# Patient Record
Sex: Male | Born: 1935
Health system: Southern US, Community
[De-identification: ages and names within clinical notes are randomized; demographics above are authoritative.]

## PROBLEM LIST (undated history)

## (undated) DIAGNOSIS — G459 Transient cerebral ischemic attack, unspecified: Secondary | ICD-10-CM

## (undated) DIAGNOSIS — N529 Male erectile dysfunction, unspecified: Secondary | ICD-10-CM

## (undated) DIAGNOSIS — N401 Enlarged prostate with lower urinary tract symptoms: Secondary | ICD-10-CM

## (undated) DIAGNOSIS — C44311 Basal cell carcinoma of skin of nose: Secondary | ICD-10-CM

## (undated) DIAGNOSIS — R002 Palpitations: Secondary | ICD-10-CM

## (undated) DIAGNOSIS — E1140 Type 2 diabetes mellitus with diabetic neuropathy, unspecified: Secondary | ICD-10-CM

## (undated) DIAGNOSIS — K219 Gastro-esophageal reflux disease without esophagitis: Secondary | ICD-10-CM

## (undated) DIAGNOSIS — F32A Depression, unspecified: Secondary | ICD-10-CM

## (undated) DIAGNOSIS — N138 Other obstructive and reflux uropathy: Secondary | ICD-10-CM

## (undated) DIAGNOSIS — D649 Anemia, unspecified: Secondary | ICD-10-CM

## (undated) DIAGNOSIS — G2581 Restless legs syndrome: Secondary | ICD-10-CM

## (undated) DIAGNOSIS — N281 Cyst of kidney, acquired: Secondary | ICD-10-CM

## (undated) DIAGNOSIS — Z9989 Dependence on other enabling machines and devices: Secondary | ICD-10-CM

## (undated) DIAGNOSIS — L719 Rosacea, unspecified: Secondary | ICD-10-CM

## (undated) DIAGNOSIS — H9113 Presbycusis, bilateral: Secondary | ICD-10-CM

## (undated) DIAGNOSIS — N183 Chronic kidney disease, stage 3 (moderate): Secondary | ICD-10-CM

## (undated) DIAGNOSIS — G4733 Obstructive sleep apnea (adult) (pediatric): Secondary | ICD-10-CM

## (undated) DIAGNOSIS — E860 Dehydration: Secondary | ICD-10-CM

## (undated) DIAGNOSIS — Z85828 Personal history of other malignant neoplasm of skin: Secondary | ICD-10-CM

## (undated) DIAGNOSIS — Z8719 Personal history of other diseases of the digestive system: Secondary | ICD-10-CM

## (undated) DIAGNOSIS — E785 Hyperlipidemia, unspecified: Secondary | ICD-10-CM

## (undated) DIAGNOSIS — I639 Cerebral infarction, unspecified: Secondary | ICD-10-CM

## (undated) DIAGNOSIS — F329 Major depressive disorder, single episode, unspecified: Secondary | ICD-10-CM

## (undated) DIAGNOSIS — G43909 Migraine, unspecified, not intractable, without status migrainosus: Secondary | ICD-10-CM

## (undated) DIAGNOSIS — Z87448 Personal history of other diseases of urinary system: Secondary | ICD-10-CM

## (undated) DIAGNOSIS — C4491 Basal cell carcinoma of skin, unspecified: Secondary | ICD-10-CM

## (undated) HISTORY — DX: Type 2 diabetes mellitus with diabetic neuropathy, unspecified: E11.40

## (undated) HISTORY — DX: Benign prostatic hyperplasia with lower urinary tract symptoms: N13.8

## (undated) HISTORY — DX: Obstructive sleep apnea (adult) (pediatric): G47.33

## (undated) HISTORY — DX: Dehydration: E86.0

## (undated) HISTORY — PX: CAROTID ENDARTERECTOMY: SUR193

## (undated) HISTORY — DX: Cerebral infarction, unspecified: I63.9

## (undated) HISTORY — DX: Transient cerebral ischemic attack, unspecified: G45.9

## (undated) HISTORY — DX: Benign prostatic hyperplasia with lower urinary tract symptoms: N40.1

## (undated) HISTORY — DX: Rosacea, unspecified: L71.9

## (undated) HISTORY — DX: Presbycusis, bilateral: H91.13

## (undated) HISTORY — PX: INGUINAL HERNIA REPAIR: SUR1180

## (undated) HISTORY — DX: Dependence on other enabling machines and devices: Z99.89

## (undated) HISTORY — DX: Depression, unspecified: F32.A

## (undated) HISTORY — DX: Basal cell carcinoma of skin, unspecified: C44.91

## (undated) HISTORY — DX: Palpitations: R00.2

## (undated) HISTORY — DX: Migraine, unspecified, not intractable, without status migrainosus: G43.909

## (undated) HISTORY — PX: CATARACT EXTRACTION W/ INTRAOCULAR LENS IMPLANT: SHX1309

## (undated) HISTORY — DX: Cyst of kidney, acquired: N28.1

## (undated) HISTORY — PX: TONSILLECTOMY AND ADENOIDECTOMY: SUR1326

## (undated) HISTORY — DX: Male erectile dysfunction, unspecified: N52.9

## (undated) HISTORY — DX: Major depressive disorder, single episode, unspecified: F32.9

## (undated) HISTORY — DX: Hyperlipidemia, unspecified: E78.5

## (undated) HISTORY — DX: Personal history of other malignant neoplasm of skin: Z85.828

## (undated) HISTORY — DX: Anemia, unspecified: D64.9

## (undated) HISTORY — DX: Chronic kidney disease, stage 3 (moderate): N18.3

---

## 1989-09-18 HISTORY — PX: HEMORRHOID SURGERY: SHX153

## 2000-09-18 DIAGNOSIS — C44311 Basal cell carcinoma of skin of nose: Secondary | ICD-10-CM

## 2000-09-18 HISTORY — DX: Basal cell carcinoma of skin of nose: C44.311

## 2000-09-18 HISTORY — PX: SKIN CANCER EXCISION: SHX779

## 2002-09-18 HISTORY — PX: CARDIAC CATHETERIZATION: SHX172

## 2004-09-18 HISTORY — PX: ESOPHAGOGASTRODUODENOSCOPY: SHX1529

## 2005-09-18 HISTORY — PX: OTHER SURGICAL HISTORY: SHX169

## 2009-02-16 HISTORY — PX: PROSTATE SURGERY: SHX751

## 2009-09-18 DIAGNOSIS — W19XXXA Unspecified fall, initial encounter: Secondary | ICD-10-CM

## 2009-09-18 DIAGNOSIS — E1140 Type 2 diabetes mellitus with diabetic neuropathy, unspecified: Secondary | ICD-10-CM

## 2009-09-18 DIAGNOSIS — D696 Thrombocytopenia, unspecified: Secondary | ICD-10-CM | POA: Diagnosis present

## 2009-09-18 DIAGNOSIS — E871 Hypo-osmolality and hyponatremia: Secondary | ICD-10-CM | POA: Diagnosis present

## 2009-09-18 HISTORY — DX: Type 2 diabetes mellitus with diabetic neuropathy, unspecified: E11.40

## 2010-04-18 HISTORY — PX: OTHER SURGICAL HISTORY: SHX169

## 2010-11-30 ENCOUNTER — Emergency Department: Payer: Self-pay | Admitting: Emergency Medicine

## 2010-12-01 ENCOUNTER — Inpatient Hospital Stay (HOSPITAL_COMMUNITY)
Admission: AD | Admit: 2010-12-01 | Discharge: 2010-12-03 | DRG: 699 | Disposition: A | Discharge status: Home / Self Care | Payer: Medicare Other | Source: Other Acute Inpatient Hospital | Attending: General Surgery | Admitting: General Surgery

## 2010-12-01 DIAGNOSIS — G4733 Obstructive sleep apnea (adult) (pediatric): Secondary | ICD-10-CM | POA: Diagnosis present

## 2010-12-01 DIAGNOSIS — E785 Hyperlipidemia, unspecified: Secondary | ICD-10-CM | POA: Diagnosis present

## 2010-12-01 DIAGNOSIS — Y9229 Other specified public building as the place of occurrence of the external cause: Secondary | ICD-10-CM

## 2010-12-01 DIAGNOSIS — S37009A Unspecified injury of unspecified kidney, initial encounter: Principal | ICD-10-CM | POA: Diagnosis present

## 2010-12-01 DIAGNOSIS — G250 Essential tremor: Secondary | ICD-10-CM | POA: Diagnosis present

## 2010-12-01 DIAGNOSIS — D62 Acute posthemorrhagic anemia: Secondary | ICD-10-CM | POA: Diagnosis present

## 2010-12-01 DIAGNOSIS — G2581 Restless legs syndrome: Secondary | ICD-10-CM | POA: Diagnosis present

## 2010-12-01 DIAGNOSIS — K219 Gastro-esophageal reflux disease without esophagitis: Secondary | ICD-10-CM | POA: Diagnosis present

## 2010-12-01 DIAGNOSIS — E119 Type 2 diabetes mellitus without complications: Secondary | ICD-10-CM | POA: Diagnosis present

## 2010-12-01 DIAGNOSIS — Y998 Other external cause status: Secondary | ICD-10-CM

## 2010-12-01 DIAGNOSIS — R030 Elevated blood-pressure reading, without diagnosis of hypertension: Secondary | ICD-10-CM | POA: Diagnosis present

## 2010-12-01 DIAGNOSIS — R296 Repeated falls: Secondary | ICD-10-CM | POA: Diagnosis present

## 2010-12-01 DIAGNOSIS — G252 Other specified forms of tremor: Secondary | ICD-10-CM | POA: Diagnosis present

## 2010-12-01 LAB — CBC
HCT: 29.6 % — ABNORMAL LOW (ref 39.0–52.0)
HCT: 28.8 % — ABNORMAL LOW (ref 39.0–52.0)
Hemoglobin: 10.2 g/dL — ABNORMAL LOW (ref 13.0–17.0)
Hemoglobin: 9.9 g/dL — ABNORMAL LOW (ref 13.0–17.0)
MCH: 30.1 pg (ref 26.0–34.0)
MCH: 30.6 pg (ref 26.0–34.0)
MCHC: 34.5 g/dL (ref 30.0–36.0)
MCHC: 34.4 g/dL (ref 30.0–36.0)
MCV: 87.3 fL (ref 78.0–100.0)
MCV: 88.9 fL (ref 78.0–100.0)
Platelets: 164 10*3/uL (ref 150–400)
Platelets: 173 10*3/uL (ref 150–400)
RBC: 3.39 MIL/uL — ABNORMAL LOW (ref 4.22–5.81)
RBC: 3.24 MIL/uL — ABNORMAL LOW (ref 4.22–5.81)
RDW: 13.2 % (ref 11.5–15.5)
RDW: 13.3 % (ref 11.5–15.5)
WBC: 11.5 10*3/uL — ABNORMAL HIGH (ref 4.0–10.5)
WBC: 11.9 10*3/uL — ABNORMAL HIGH (ref 4.0–10.5)

## 2010-12-01 LAB — URINALYSIS, ROUTINE W REFLEX MICROSCOPIC
Bilirubin Urine: NEGATIVE
Glucose, UA: >1000 mg/dL — AB
Ketones, ur: NEGATIVE mg/dL
Leukocytes, UA: NEGATIVE
Nitrite: NEGATIVE
Protein, ur: NEGATIVE mg/dL
Specific Gravity, Urine: 1.036 — ABNORMAL HIGH (ref 1.005–1.030)
Urobilinogen, UA: 0.2 mg/dL (ref 0.0–1.0)
pH: 5 (ref 5.0–8.0)

## 2010-12-01 LAB — BASIC METABOLIC PANEL
BUN: 24 mg/dL — ABNORMAL HIGH (ref 6–23)
CO2: 24 mEq/L (ref 19–32)
Calcium: 8.4 mg/dL (ref 8.4–10.5)
Chloride: 106 mEq/L (ref 96–112)
Creatinine, Ser: 1.34 mg/dL (ref 0.4–1.5)
GFR calc Af Amer: >60 mL/min (ref >60)
GFR calc non Af Amer: 52 mL/min — ABNORMAL LOW (ref >60)
Glucose, Bld: 307 mg/dL — ABNORMAL HIGH (ref 70–99)
Potassium: 3.9 mEq/L (ref 3.5–5.1)
Sodium: 138 mEq/L (ref 135–145)

## 2010-12-01 LAB — URINE MICROSCOPIC-ADD ON

## 2010-12-01 LAB — GLUCOSE, CAPILLARY
Glucose-Capillary: 302 mg/dL — ABNORMAL HIGH (ref 70–99)
Glucose-Capillary: 289 mg/dL — ABNORMAL HIGH (ref 70–99)
Glucose-Capillary: 288 mg/dL — ABNORMAL HIGH (ref 70–99)

## 2010-12-01 LAB — MRSA PCR SCREENING: MRSA by PCR: NEGATIVE

## 2010-12-01 LAB — HEMOGLOBIN A1C
Hgb A1c MFr Bld: 9.3 % — ABNORMAL HIGH (ref <5.7)
Mean Plasma Glucose: 220 mg/dL — ABNORMAL HIGH (ref <117)

## 2010-12-02 LAB — CBC
HCT: 25.7 % — ABNORMAL LOW (ref 39.0–52.0)
HCT: 26.5 % — ABNORMAL LOW (ref 39.0–52.0)
HCT: 26.7 % — ABNORMAL LOW (ref 39.0–52.0)
HCT: 26.7 % — ABNORMAL LOW (ref 39.0–52.0)
Hemoglobin: 8.8 g/dL — ABNORMAL LOW (ref 13.0–17.0)
Hemoglobin: 9 g/dL — ABNORMAL LOW (ref 13.0–17.0)
Hemoglobin: 9.1 g/dL — ABNORMAL LOW (ref 13.0–17.0)
Hemoglobin: 9.1 g/dL — ABNORMAL LOW (ref 13.0–17.0)
MCH: 29.3 pg (ref 26.0–34.0)
MCH: 29.9 pg (ref 26.0–34.0)
MCH: 30 pg (ref 26.0–34.0)
MCH: 30.2 pg (ref 26.0–34.0)
MCHC: 33.7 g/dL (ref 30.0–36.0)
MCHC: 34.1 g/dL (ref 30.0–36.0)
MCHC: 34.2 g/dL (ref 30.0–36.0)
MCHC: 34.3 g/dL (ref 30.0–36.0)
MCV: 87 fL (ref 78.0–100.0)
MCV: 87.4 fL (ref 78.0–100.0)
MCV: 88 fL (ref 78.0–100.0)
MCV: 88.1 fL (ref 78.0–100.0)
Platelets: 138 10*3/uL — ABNORMAL LOW (ref 150–400)
Platelets: 139 10*3/uL — ABNORMAL LOW (ref 150–400)
Platelets: 141 10*3/uL — ABNORMAL LOW (ref 150–400)
Platelets: 156 10*3/uL (ref 150–400)
RBC: 2.94 MIL/uL — ABNORMAL LOW (ref 4.22–5.81)
RBC: 3.01 MIL/uL — ABNORMAL LOW (ref 4.22–5.81)
RBC: 3.03 MIL/uL — ABNORMAL LOW (ref 4.22–5.81)
RBC: 3.07 MIL/uL — ABNORMAL LOW (ref 4.22–5.81)
RDW: 12.9 % (ref 11.5–15.5)
RDW: 13.1 % (ref 11.5–15.5)
RDW: 13.3 % (ref 11.5–15.5)
RDW: 13.3 % (ref 11.5–15.5)
WBC: 11.2 10*3/uL — ABNORMAL HIGH (ref 4.0–10.5)
WBC: 11.2 10*3/uL — ABNORMAL HIGH (ref 4.0–10.5)
WBC: 11.3 10*3/uL — ABNORMAL HIGH (ref 4.0–10.5)
WBC: 12.4 10*3/uL — ABNORMAL HIGH (ref 4.0–10.5)

## 2010-12-02 LAB — GLUCOSE, CAPILLARY
Glucose-Capillary: 281 mg/dL — ABNORMAL HIGH (ref 70–99)
Glucose-Capillary: 237 mg/dL — ABNORMAL HIGH (ref 70–99)
Glucose-Capillary: 133 mg/dL — ABNORMAL HIGH (ref 70–99)
Glucose-Capillary: 202 mg/dL — ABNORMAL HIGH (ref 70–99)

## 2010-12-02 NOTE — H&P (Signed)
NAMEMOTTY, BORIN            ACCOUNT NO.:  [id number]  MEDICAL RECORD NO.:  [id number]           PATIENT TYPE:  I  LOCATION:  3311                         FACILITY:  [facility]  PHYSICIAN:  Almond Lint, MD       DATE OF BIRTH:  July 08, 1931  DATE OF ADMISSION:  12/01/2010 DATE OF DISCHARGE:                             HISTORY & PHYSICAL   CHIEF COMPLAINT:  Fall.  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  He struck his left side on the curb and had immediate left-sided pain.  He complained of pain over his lower chest and flank. He had an episode of emesis.  This occurred around 7 p.m. last night. He denied hit of his head and did not lose consciousness.  He describes the pain is worse with movement.  He does not have any more nausea or vomiting, now slightly hazy urine.  PAST MEDICAL HISTORY:  Significant for diabetes, sleep apnea, and restless legs syndrome.  PAST SURGICAL HISTORY:  Hernia repair.  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  ALLERGIES:  None.  MEDICATIONS:  Glyburide, metformin, Mirapex, vitamins, and CPAP.  He used to take an 81 mg aspirin daily, but has not been taking it for around a week.  REVIEW OF SYSTEMS:  Otherwise negative x 1.  PHYSICAL EXAMINATION:  VITAL SIGNS:  Temperature 98.7, pulse 79, respiratory rate 14, blood pressure 147/74, and O2 sats 98% on room air. SKIN:  He has some expected skin areas of pigmentation because of age. HEENT:  Head is normocephalic and atraumatic.  Sclerae anicteric. Pupils are equal and reactive to light.  Ears is no signs of external trauma and no blood in the auditory meatus.  Face, midface is stable. NECK:  Supple.  No lymphadenopathy.  No thyromegaly.  Good range of motion and nontender. LUNGS:  Clear bilaterally.  No wheeze, rales, or rhonchi. HEART:   Regular rate and rhythm. ABDOMEN:  Soft, slightly distended, tender in the left abdomen. PELVIS:  Stable. EXTREMITIES:  Nontender.  He does have a very superficial abrasion over his left knee. BACK:  Atraumatic. NEURO:  No abnormal findings  LABORATORY DATA:  Sodium 136, potassium 4.5, chloride 100, CO2 of 25, BUN 26, creatinine 1.59, glucose 381, white count 71.8, hemoglobin 12.9, hematocrit 37, platelet count 205,000 at 12:33 a.m. and 3:20 a.m.; white count 15.5, hemoglobin 1.1, hematocrit 31.8, and platelet count 198,000. Chest x-ray appears negative and also urine included in his workup and CT of the abdomen only not pelvis with IV contrast shows a prominent left subcapsular hematoma in the kidney extending into the pararenal fascia.  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  We will order serial CBCs and if these are stable through the day and can probably start to come off his n.p.o. status and may be able to transfer to the floor with incentive spirometry and pulmonary toilet.  For problems of diabetes, hold metformin,  treat with insulin.  We will hold aspirin, and administer CPAP for sleep apnea.     Almond Lint, MD     FB/MEDQ  D:  12/01/2010  T:  12/02/2010  Job:  [id number]  Electronically Signed by Almond Lint MD on 12/02/2010 12:19:45 PM

## 2010-12-03 LAB — CBC
HCT: 26.4 % — ABNORMAL LOW (ref 39.0–52.0)
HCT: 28 % — ABNORMAL LOW (ref 39.0–52.0)
Hemoglobin: 9.1 g/dL — ABNORMAL LOW (ref 13.0–17.0)
Hemoglobin: 9.7 g/dL — ABNORMAL LOW (ref 13.0–17.0)
MCH: 30 pg (ref 26.0–34.0)
MCH: 30.1 pg (ref 26.0–34.0)
MCHC: 34.5 g/dL (ref 30.0–36.0)
MCHC: 34.6 g/dL (ref 30.0–36.0)
MCV: 87.1 fL (ref 78.0–100.0)
MCV: 87 fL (ref 78.0–100.0)
Platelets: 157 10*3/uL (ref 150–400)
Platelets: 149 10*3/uL — ABNORMAL LOW (ref 150–400)
RBC: 3.03 MIL/uL — ABNORMAL LOW (ref 4.22–5.81)
RBC: 3.22 MIL/uL — ABNORMAL LOW (ref 4.22–5.81)
RDW: 12.9 % (ref 11.5–15.5)
RDW: 12.9 % (ref 11.5–15.5)
WBC: 12.7 10*3/uL — ABNORMAL HIGH (ref 4.0–10.5)
WBC: 10.7 10*3/uL — ABNORMAL HIGH (ref 4.0–10.5)

## 2010-12-03 LAB — GLUCOSE, CAPILLARY: Glucose-Capillary: 208 mg/dL — ABNORMAL HIGH (ref 70–99)

## 2010-12-05 LAB — GLUCOSE, CAPILLARY: Glucose-Capillary: 231 mg/dL — ABNORMAL HIGH (ref 70–99)

## 2010-12-06 NOTE — Discharge Summary (Signed)
James Bell, James Bell            ACCOUNT NO.:  [id number]  MEDICAL RECORD NO.:  [id number]           PATIENT TYPE:  I  LOCATION:  5120                         FACILITY:  [facility]  PHYSICIAN:  Lennie Muckle, MD      DATE OF BIRTH:  11-23-1930  DATE OF ADMISSION:  12/01/2010 DATE OF DISCHARGE:  12/03/2010                              DISCHARGE SUMMARY   DISCHARGE DIAGNOSES: 1. Fall. 2. Left renal laceration. 3. Acute blood loss anemia. 4. Obstructive sleep apnea. 5. Diabetes. 6. Restless legs syndrome. 7. Benign tremor. 8. Dyslipidemia. 9. Gastroesophageal reflux disease. 10.Elevated blood pressure.  CONSULTANTS:  None.  PROCEDURE:  None.  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  It was a mechanical fall as they were doing some workup in the area.  He struck his left abdomen and then bounced onto his back, striking that on the curb as well.  He had pain and an episode of emesis.  He went home but was still having significant amount of pain and so was evaluated at [facility].  He was diagnosed with a left renal laceration and transferred to the [facility] for further care.  HOSPITAL COURSE:  From a trauma standpoint, the patient's course was uneventful.  His hemoglobin drifted down into the 9s and stabilized there.  Once it did, he was able to be mobilized and did not have any drops in his hemoglobin.  He came in with uncontrolled diabetes.  The certified diabetic educator was consulted and recommended some outpatient diabetes education which was ordered.  The patient is a recent transplant to this area and has no established physicians.  He requested some referrals before he left the hospital.  His blood pressure was also elevated here in the hospital with systolics in the 170s.  He was placed on a beta-blocker which did an acceptable job in lowering that.  The rest of the patient's  medical problems remained stable, and he was able to be discharged in good condition.  DISCHARGE MEDICATIONS:  Percocet 5/325, take 1-2 p.o. q.4 h p.r.n. pain, #60 with no refill.  The patient was on a 20 mg propranolol tablet before surgery/procedure.  I have told him to take it twice daily until he sees internist.  He may resume the rest of his home medications which include; 1. Avodart 0.5 mg daily. 2. Calcium carbonate over-the-counter twice daily. 3. Crestor 10 mg daily. 4. Cymbalta 30 mg every evening p.o. 5. CoQ enzyme 150 mg twice daily. 6. Fish oil twice daily. 7. Flonase 1 spray in each nostril daily as needed. 8. Folbee vitamin daily. 9. Glyburide 5 mg before meals. 10.Januvia 100 mg daily. 11.Lunesta 3 mg daily at bedtime as needed for insomnia. 12.Metformin 1000 mg twice daily. 13.Mirapex 1 mg daily at bedtime. 14.Prilosec 20 mg daily. 15.Saline nasal spray nasally twice daily as needed for dryness. 16.Vitamin C 500 mg 4 times daily. 17.Zyrtec 10 mg daily. 18.Zomig 5 mg daily as needed for migraine. He should stop taking his aspirin for 1 month.  FOLLOWUP:  I  have recommended [facility] as an internal medicine group for the patient and have given their number to him.  I also recommended Dr. Sharl Ma for his diabetes.  He will call to set up appointments with those practices.  If he has any questions or concerns, first he may let us know but follow up with [name] will be on an as-needed basis.     Earney Hamburg, P.A.   ______________________________ Lennie Muckle, MD    MJ/MEDQ  D:  12/03/2010  T:  12/04/2010  Job:  [id number]  cc:   [facility] Tonita Cong, M.D.  Electronically Signed by Charma Igo P.A. on 12/05/2010 10:31:58 AM Electronically Signed by Bertram Savin MD on 12/06/2010 12:01:03 PM

## 2010-12-18 DIAGNOSIS — Z87448 Personal history of other diseases of urinary system: Secondary | ICD-10-CM

## 2010-12-18 HISTORY — DX: Personal history of other diseases of urinary system: Z87.448

## 2011-01-12 ENCOUNTER — Encounter: Payer: Medicare Other | Attending: General Surgery | Admitting: Dietician

## 2011-01-12 DIAGNOSIS — Z713 Dietary counseling and surveillance: Secondary | ICD-10-CM | POA: Insufficient documentation

## 2011-03-19 HISTORY — PX: COLONOSCOPY: SHX174

## 2011-06-19 HISTORY — PX: COLONOSCOPY: SHX174

## 2011-07-20 HISTORY — PX: ESOPHAGOGASTRODUODENOSCOPY: SHX1529

## 2011-09-21 DIAGNOSIS — R5383 Other fatigue: Secondary | ICD-10-CM | POA: Diagnosis not present

## 2011-09-21 DIAGNOSIS — E785 Hyperlipidemia, unspecified: Secondary | ICD-10-CM | POA: Diagnosis not present

## 2011-09-21 DIAGNOSIS — R5381 Other malaise: Secondary | ICD-10-CM | POA: Diagnosis not present

## 2011-09-21 DIAGNOSIS — G4730 Sleep apnea, unspecified: Secondary | ICD-10-CM | POA: Diagnosis not present

## 2011-09-21 DIAGNOSIS — E1142 Type 2 diabetes mellitus with diabetic polyneuropathy: Secondary | ICD-10-CM | POA: Diagnosis not present

## 2011-09-21 DIAGNOSIS — E1149 Type 2 diabetes mellitus with other diabetic neurological complication: Secondary | ICD-10-CM | POA: Diagnosis not present

## 2011-09-21 DIAGNOSIS — N529 Male erectile dysfunction, unspecified: Secondary | ICD-10-CM | POA: Diagnosis not present

## 2011-09-21 DIAGNOSIS — R109 Unspecified abdominal pain: Secondary | ICD-10-CM | POA: Diagnosis not present

## 2011-09-22 DIAGNOSIS — R109 Unspecified abdominal pain: Secondary | ICD-10-CM | POA: Diagnosis not present

## 2011-09-22 DIAGNOSIS — N529 Male erectile dysfunction, unspecified: Secondary | ICD-10-CM | POA: Diagnosis not present

## 2011-09-22 DIAGNOSIS — R5381 Other malaise: Secondary | ICD-10-CM | POA: Diagnosis not present

## 2011-09-25 DIAGNOSIS — E119 Type 2 diabetes mellitus without complications: Secondary | ICD-10-CM | POA: Diagnosis not present

## 2011-09-25 DIAGNOSIS — G2581 Restless legs syndrome: Secondary | ICD-10-CM | POA: Diagnosis not present

## 2011-09-25 DIAGNOSIS — G4730 Sleep apnea, unspecified: Secondary | ICD-10-CM | POA: Diagnosis not present

## 2011-09-25 DIAGNOSIS — I10 Essential (primary) hypertension: Secondary | ICD-10-CM | POA: Diagnosis not present

## 2011-09-25 DIAGNOSIS — E782 Mixed hyperlipidemia: Secondary | ICD-10-CM | POA: Diagnosis not present

## 2011-09-25 DIAGNOSIS — Z79899 Other long term (current) drug therapy: Secondary | ICD-10-CM | POA: Diagnosis not present

## 2011-09-29 DIAGNOSIS — E236 Other disorders of pituitary gland: Secondary | ICD-10-CM | POA: Diagnosis not present

## 2011-09-29 DIAGNOSIS — N529 Male erectile dysfunction, unspecified: Secondary | ICD-10-CM | POA: Diagnosis not present

## 2011-10-26 DIAGNOSIS — E236 Other disorders of pituitary gland: Secondary | ICD-10-CM | POA: Diagnosis not present

## 2011-10-26 DIAGNOSIS — Z125 Encounter for screening for malignant neoplasm of prostate: Secondary | ICD-10-CM | POA: Diagnosis not present

## 2012-01-17 DIAGNOSIS — R002 Palpitations: Secondary | ICD-10-CM

## 2012-01-17 HISTORY — DX: Palpitations: R00.2

## 2012-01-22 DIAGNOSIS — R002 Palpitations: Secondary | ICD-10-CM | POA: Diagnosis not present

## 2012-01-22 DIAGNOSIS — G4730 Sleep apnea, unspecified: Secondary | ICD-10-CM | POA: Diagnosis not present

## 2012-01-22 DIAGNOSIS — E119 Type 2 diabetes mellitus without complications: Secondary | ICD-10-CM | POA: Diagnosis not present

## 2012-01-29 DIAGNOSIS — R002 Palpitations: Secondary | ICD-10-CM | POA: Diagnosis not present

## 2012-02-01 DIAGNOSIS — R002 Palpitations: Secondary | ICD-10-CM | POA: Diagnosis not present

## 2012-03-07 DIAGNOSIS — I658 Occlusion and stenosis of other precerebral arteries: Secondary | ICD-10-CM | POA: Diagnosis not present

## 2012-03-07 DIAGNOSIS — R079 Chest pain, unspecified: Secondary | ICD-10-CM | POA: Diagnosis not present

## 2012-03-07 DIAGNOSIS — Z7982 Long term (current) use of aspirin: Secondary | ICD-10-CM | POA: Diagnosis not present

## 2012-03-07 DIAGNOSIS — R5383 Other fatigue: Secondary | ICD-10-CM | POA: Diagnosis not present

## 2012-03-07 DIAGNOSIS — I6350 Cerebral infarction due to unspecified occlusion or stenosis of unspecified cerebral artery: Secondary | ICD-10-CM | POA: Diagnosis not present

## 2012-03-07 DIAGNOSIS — I6529 Occlusion and stenosis of unspecified carotid artery: Secondary | ICD-10-CM | POA: Diagnosis not present

## 2012-03-07 DIAGNOSIS — E119 Type 2 diabetes mellitus without complications: Secondary | ICD-10-CM | POA: Diagnosis not present

## 2012-03-07 DIAGNOSIS — Z79899 Other long term (current) drug therapy: Secondary | ICD-10-CM | POA: Diagnosis not present

## 2012-03-07 DIAGNOSIS — Z823 Family history of stroke: Secondary | ICD-10-CM | POA: Diagnosis not present

## 2012-03-07 DIAGNOSIS — R5381 Other malaise: Secondary | ICD-10-CM | POA: Diagnosis not present

## 2012-03-07 DIAGNOSIS — E7800 Pure hypercholesterolemia, unspecified: Secondary | ICD-10-CM | POA: Diagnosis not present

## 2012-03-07 DIAGNOSIS — Z794 Long term (current) use of insulin: Secondary | ICD-10-CM | POA: Diagnosis not present

## 2012-03-07 DIAGNOSIS — I639 Cerebral infarction, unspecified: Secondary | ICD-10-CM

## 2012-03-07 DIAGNOSIS — G2581 Restless legs syndrome: Secondary | ICD-10-CM | POA: Diagnosis not present

## 2012-03-07 DIAGNOSIS — I6789 Other cerebrovascular disease: Secondary | ICD-10-CM | POA: Diagnosis not present

## 2012-03-07 DIAGNOSIS — G8190 Hemiplegia, unspecified affecting unspecified side: Secondary | ICD-10-CM | POA: Diagnosis not present

## 2012-03-07 DIAGNOSIS — R29818 Other symptoms and signs involving the nervous system: Secondary | ICD-10-CM | POA: Diagnosis not present

## 2012-03-07 HISTORY — DX: Cerebral infarction, unspecified: I63.9

## 2012-03-11 ENCOUNTER — Encounter (HOSPITAL_COMMUNITY): Payer: Self-pay | Admitting: *Deleted

## 2012-03-11 ENCOUNTER — Observation Stay (HOSPITAL_COMMUNITY): Payer: Medicare Other

## 2012-03-11 ENCOUNTER — Emergency Department (HOSPITAL_COMMUNITY): Payer: Medicare Other

## 2012-03-11 ENCOUNTER — Inpatient Hospital Stay (HOSPITAL_COMMUNITY)
Admission: EM | Admit: 2012-03-11 | Discharge: 2012-03-13 | DRG: 065 | Disposition: A | Discharge status: Rehabilitation Facility | Payer: Medicare Other | Source: Ambulatory Visit | Attending: Internal Medicine | Admitting: Internal Medicine

## 2012-03-11 DIAGNOSIS — Z794 Long term (current) use of insulin: Secondary | ICD-10-CM

## 2012-03-11 DIAGNOSIS — R748 Abnormal levels of other serum enzymes: Secondary | ICD-10-CM | POA: Diagnosis not present

## 2012-03-11 DIAGNOSIS — I69351 Hemiplegia and hemiparesis following cerebral infarction affecting right dominant side: Secondary | ICD-10-CM | POA: Diagnosis present

## 2012-03-11 DIAGNOSIS — R5381 Other malaise: Secondary | ICD-10-CM | POA: Diagnosis not present

## 2012-03-11 DIAGNOSIS — E1122 Type 2 diabetes mellitus with diabetic chronic kidney disease: Secondary | ICD-10-CM | POA: Diagnosis present

## 2012-03-11 DIAGNOSIS — Z7982 Long term (current) use of aspirin: Secondary | ICD-10-CM

## 2012-03-11 DIAGNOSIS — G8191 Hemiplegia, unspecified affecting right dominant side: Secondary | ICD-10-CM

## 2012-03-11 DIAGNOSIS — G4733 Obstructive sleep apnea (adult) (pediatric): Secondary | ICD-10-CM | POA: Diagnosis not present

## 2012-03-11 DIAGNOSIS — M47812 Spondylosis without myelopathy or radiculopathy, cervical region: Secondary | ICD-10-CM | POA: Diagnosis not present

## 2012-03-11 DIAGNOSIS — I6350 Cerebral infarction due to unspecified occlusion or stenosis of unspecified cerebral artery: Principal | ICD-10-CM | POA: Diagnosis present

## 2012-03-11 DIAGNOSIS — R404 Transient alteration of awareness: Secondary | ICD-10-CM | POA: Diagnosis not present

## 2012-03-11 DIAGNOSIS — N1831 Chronic kidney disease, stage 3a: Secondary | ICD-10-CM | POA: Diagnosis present

## 2012-03-11 DIAGNOSIS — G8190 Hemiplegia, unspecified affecting unspecified side: Secondary | ICD-10-CM | POA: Diagnosis not present

## 2012-03-11 DIAGNOSIS — N183 Chronic kidney disease, stage 3 unspecified: Secondary | ICD-10-CM

## 2012-03-11 DIAGNOSIS — E7800 Pure hypercholesterolemia, unspecified: Secondary | ICD-10-CM | POA: Diagnosis present

## 2012-03-11 DIAGNOSIS — E1165 Type 2 diabetes mellitus with hyperglycemia: Secondary | ICD-10-CM

## 2012-03-11 DIAGNOSIS — IMO0001 Reserved for inherently not codable concepts without codable children: Secondary | ICD-10-CM

## 2012-03-11 DIAGNOSIS — Z9989 Dependence on other enabling machines and devices: Secondary | ICD-10-CM | POA: Diagnosis present

## 2012-03-11 DIAGNOSIS — E119 Type 2 diabetes mellitus without complications: Secondary | ICD-10-CM | POA: Diagnosis present

## 2012-03-11 DIAGNOSIS — I6330 Cerebral infarction due to thrombosis of unspecified cerebral artery: Secondary | ICD-10-CM

## 2012-03-11 DIAGNOSIS — Z8673 Personal history of transient ischemic attack (TIA), and cerebral infarction without residual deficits: Secondary | ICD-10-CM | POA: Diagnosis present

## 2012-03-11 DIAGNOSIS — R5383 Other fatigue: Secondary | ICD-10-CM | POA: Diagnosis not present

## 2012-03-11 DIAGNOSIS — E1140 Type 2 diabetes mellitus with diabetic neuropathy, unspecified: Secondary | ICD-10-CM | POA: Diagnosis present

## 2012-03-11 DIAGNOSIS — G319 Degenerative disease of nervous system, unspecified: Secondary | ICD-10-CM | POA: Diagnosis not present

## 2012-03-11 DIAGNOSIS — R4789 Other speech disturbances: Secondary | ICD-10-CM | POA: Diagnosis not present

## 2012-03-11 DIAGNOSIS — G43909 Migraine, unspecified, not intractable, without status migrainosus: Secondary | ICD-10-CM | POA: Insufficient documentation

## 2012-03-11 DIAGNOSIS — R110 Nausea: Secondary | ICD-10-CM | POA: Diagnosis not present

## 2012-03-11 DIAGNOSIS — I639 Cerebral infarction, unspecified: Secondary | ICD-10-CM

## 2012-03-11 DIAGNOSIS — I6340 Cerebral infarction due to embolism of unspecified cerebral artery: Secondary | ICD-10-CM | POA: Diagnosis not present

## 2012-03-11 DIAGNOSIS — R7989 Other specified abnormal findings of blood chemistry: Secondary | ICD-10-CM

## 2012-03-11 HISTORY — DX: Gastro-esophageal reflux disease without esophagitis: K21.9

## 2012-03-11 HISTORY — DX: Basal cell carcinoma of skin of nose: C44.311

## 2012-03-11 HISTORY — DX: Cerebral infarction, unspecified: I63.9

## 2012-03-11 HISTORY — DX: Personal history of other diseases of the digestive system: Z87.19

## 2012-03-11 HISTORY — DX: Migraine, unspecified, not intractable, without status migrainosus: G43.909

## 2012-03-11 HISTORY — DX: Personal history of other diseases of urinary system: Z87.448

## 2012-03-11 HISTORY — DX: Chronic kidney disease, stage 3 unspecified: N18.30

## 2012-03-11 HISTORY — DX: Restless legs syndrome: G25.81

## 2012-03-11 LAB — DIFFERENTIAL
Basophils Absolute: 0 10*3/uL (ref 0.0–0.1)
Basophils Relative: 0 % (ref 0–1)
Eosinophils Absolute: 0.1 10*3/uL (ref 0.0–0.7)
Eosinophils Relative: 1 % (ref 0–5)
Lymphocytes Relative: 17 % (ref 12–46)
Lymphs Abs: 1.6 10*3/uL (ref 0.7–4.0)
Monocytes Absolute: 1 10*3/uL (ref 0.1–1.0)
Monocytes Relative: 10 % (ref 3–12)
Neutro Abs: 6.7 10*3/uL (ref 1.7–7.7)
Neutrophils Relative %: 71 % (ref 43–77)

## 2012-03-11 LAB — COMPREHENSIVE METABOLIC PANEL
ALT: 13 U/L (ref 0–53)
AST: 18 U/L (ref 0–37)
Albumin: 3.8 g/dL (ref 3.5–5.2)
Alkaline Phosphatase: 35 U/L — ABNORMAL LOW (ref 39–117)
BUN: 42 mg/dL — ABNORMAL HIGH (ref 6–23)
CO2: 20 mEq/L (ref 19–32)
Calcium: 9.5 mg/dL (ref 8.4–10.5)
Chloride: 103 mEq/L (ref 96–112)
Creatinine, Ser: 1.78 mg/dL — ABNORMAL HIGH (ref 0.50–1.35)
GFR calc Af Amer: 40 mL/min — ABNORMAL LOW (ref >90)
GFR calc non Af Amer: 35 mL/min — ABNORMAL LOW (ref >90)
Glucose, Bld: 181 mg/dL — ABNORMAL HIGH (ref 70–99)
Potassium: 4.6 mEq/L (ref 3.5–5.1)
Sodium: 138 mEq/L (ref 135–145)
Total Bilirubin: 0.3 mg/dL (ref 0.3–1.2)
Total Protein: 7.4 g/dL (ref 6.0–8.3)

## 2012-03-11 LAB — PROTIME-INR
INR: 1.04 (ref 0.00–1.49)
Prothrombin Time: 13.8 seconds (ref 11.6–15.2)

## 2012-03-11 LAB — GLUCOSE, CAPILLARY
Glucose-Capillary: 148 mg/dL — ABNORMAL HIGH (ref 70–99)
Glucose-Capillary: 129 mg/dL — ABNORMAL HIGH (ref 70–99)
Glucose-Capillary: 124 mg/dL — ABNORMAL HIGH (ref 70–99)
Glucose-Capillary: 124 mg/dL — ABNORMAL HIGH (ref 70–99)

## 2012-03-11 LAB — HEMOGLOBIN A1C
Hgb A1c MFr Bld: 6.3 % — ABNORMAL HIGH (ref <5.7)
Mean Plasma Glucose: 134 mg/dL — ABNORMAL HIGH (ref <117)

## 2012-03-11 LAB — CBC
HCT: 40.2 % (ref 39.0–52.0)
Hemoglobin: 13.9 g/dL (ref 13.0–17.0)
MCH: 30.3 pg (ref 26.0–34.0)
MCHC: 34.6 g/dL (ref 30.0–36.0)
MCV: 87.8 fL (ref 78.0–100.0)
Platelets: 168 10*3/uL (ref 150–400)
RBC: 4.58 MIL/uL (ref 4.22–5.81)
RDW: 13 % (ref 11.5–15.5)
WBC: 9.5 10*3/uL (ref 4.0–10.5)

## 2012-03-11 LAB — APTT: aPTT: 30 seconds (ref 24–37)

## 2012-03-11 LAB — TROPONIN I: Troponin I: <0.3 ng/mL (ref <0.30)

## 2012-03-11 MED ORDER — ASPIRIN 300 MG RE SUPP
300.0000 mg | Freq: Every day | RECTAL | Status: DC
  Filled 2012-03-11: qty 1

## 2012-03-11 MED ORDER — SODIUM CHLORIDE 0.9 % IV SOLN
Freq: Once | INTRAVENOUS | Status: AC
  Administered 2012-03-11: 125 mL/h via INTRAVENOUS

## 2012-03-11 MED ORDER — ENOXAPARIN SODIUM 40 MG/0.4ML ~~LOC~~ SOLN
40.0000 mg | SUBCUTANEOUS | Status: DC
  Administered 2012-03-11 – 2012-03-13 (×3): 40 mg via SUBCUTANEOUS
  Filled 2012-03-11 (×3): qty 0.4

## 2012-03-11 MED ORDER — SODIUM CHLORIDE 0.9 % IV SOLN: INTRAVENOUS | Status: AC

## 2012-03-11 MED ORDER — PRAMIPEXOLE DIHYDROCHLORIDE 1 MG PO TABS
1.0000 mg | ORAL_TABLET | Freq: Every day | ORAL | Status: DC
  Administered 2012-03-11 – 2012-03-12 (×2): 1 mg via ORAL
  Filled 2012-03-11 (×4): qty 1

## 2012-03-11 MED ORDER — CLOPIDOGREL BISULFATE 75 MG PO TABS
75.0000 mg | ORAL_TABLET | Freq: Every day | ORAL | Status: DC
  Administered 2012-03-12 – 2012-03-13 (×2): 75 mg via ORAL
  Filled 2012-03-11 (×4): qty 1

## 2012-03-11 MED ORDER — INSULIN GLARGINE 100 UNIT/ML ~~LOC~~ SOLN
10.0000 [IU] | Freq: Every day | SUBCUTANEOUS | Status: DC
  Administered 2012-03-11 – 2012-03-12 (×2): 10 [IU] via SUBCUTANEOUS

## 2012-03-11 MED ORDER — ACETAMINOPHEN 325 MG PO TABS
650.0000 mg | ORAL_TABLET | ORAL | Status: DC | PRN
  Administered 2012-03-12: 650 mg via ORAL
  Filled 2012-03-11: qty 2

## 2012-03-11 MED ORDER — INSULIN ASPART 100 UNIT/ML ~~LOC~~ SOLN
0.0000 [IU] | SUBCUTANEOUS | Status: DC
  Administered 2012-03-11 – 2012-03-12 (×5): 2 [IU] via SUBCUTANEOUS

## 2012-03-11 MED ORDER — SENNOSIDES-DOCUSATE SODIUM 8.6-50 MG PO TABS
1.0000 | ORAL_TABLET | Freq: Every evening | ORAL | Status: DC | PRN
  Administered 2012-03-12: 1 via ORAL
  Filled 2012-03-11: qty 1

## 2012-03-11 MED ORDER — ONDANSETRON HCL 4 MG/2ML IJ SOLN: 4.0000 mg | Freq: Four times a day (QID) | INTRAMUSCULAR | Status: DC | PRN

## 2012-03-11 MED ORDER — ONDANSETRON HCL 4 MG/2ML IJ SOLN: 4.0000 mg | Freq: Three times a day (TID) | INTRAMUSCULAR | Status: AC | PRN

## 2012-03-11 MED ORDER — DEXTROSE-NACL 5-0.45 % IV SOLN
INTRAVENOUS | Status: DC
  Administered 2012-03-11: 13:00:00 via INTRAVENOUS

## 2012-03-11 MED ORDER — ASPIRIN 325 MG PO TABS
325.0000 mg | ORAL_TABLET | Freq: Every day | ORAL | Status: DC
  Administered 2012-03-11 – 2012-03-12 (×2): 325 mg via ORAL
  Filled 2012-03-11 (×2): qty 1

## 2012-03-11 MED ORDER — ACETAMINOPHEN 650 MG RE SUPP: 650.0000 mg | RECTAL | Status: DC | PRN

## 2012-03-11 NOTE — ED Notes (Signed)
LSN 1030 last night.  EMS called by wife who found husband on floor by bed (where he had apparently been laying for 1 hour).  Pt states he had stroke last Thurs and has deficits from that, but his wife feels that his facial droop is more pronounced.

## 2012-03-11 NOTE — ED Notes (Signed)
Pt c/o having trouble breathing for several days.  He c/o choking and coughing every time after he eats.  Lung sounds clear.  Dr Nicanor Alcon notified.

## 2012-03-11 NOTE — Consult Note (Signed)
[facility] CONSULT NOTE     Reason for Consult: increased Right sided weakness    HPI:    James Bell is an 78 y.o. male with  has a past medical history of Hypercholesteremia; Diabetes mellitus; and Stroke. He was recently seen at [facility] after waking on 03/07/12 with right sided weakness and inability to write his name.  His MRI and CT head showed a posterior limb of IC CVA, dopplers showed no ICA stenosis, MRA brain showed no significant stenosis. 2 D echo was not performed that I can see. Patient was discharged on ASA (increased from 81mg  to 325mg ) and Zocor with plan to follow up with PT.  Last night patient noted increased speech and swallowing difficulty and awoke this am with flaccid right arm and leg.  Patient was brought to [place] where CT head shows a hypodensity in the BG consistent with patient's previous infarct.  Past Medical History  Diagnosis Date  . Hypercholesteremia   . Diabetes mellitus   . Stroke     No past surgical history on file.  No family history on file.  Social History:  does not have a smoking history on file. He does not have any smokeless tobacco history on file. His alcohol and drug histories not on file.  No Known Allergies  Medications:    Prior to Admission:  ASA 325 Cymbalta Lantus Prinivil Mirapex Crestor Janumet  Scheduled:    Review of Systems - General ROS: negative for - chills, fatigue, fever or hot flashes Hematological and Lymphatic ROS: negative for - bruising, fatigue, jaundice or pallor Endocrine ROS: negative for - hair pattern changes, hot flashes, mood swings or skin changes Respiratory ROS: negative for - cough, hemoptysis, orthopnea or wheezing Cardiovascular ROS: negative for - dyspnea on exertion, orthopnea, palpitations or shortness of breath Gastrointestinal ROS: negative for - abdominal pain, appetite loss, blood in stools, diarrhea or  hematemesis Musculoskeletal ROS: negative for - joint pain, joint stiffness, joint swelling or muscle pain Neurological ROS: positive for - weakness Dermatological ROS: negative for dry skin, pruritus and rash   Blood pressure 109/57, pulse 82, temperature 97.5 F (36.4 C), temperature source Oral, resp. rate 15, SpO2 97.00%.   Neurologic Examination:   Mental Status: Alert, oriented, thought content appropriate.  Speech dysarthric without evidence of aphasia. Able to follow 3 step commands without difficulty. Cranial Nerves: II-Visual fields grossly intact. III/IV/VI-Extraocular movements intact.  Pupils reactive bilaterally. V/VII-Smile asymmetric with right facial droop VIII-grossly intact IX/X-normal gag XI-Shoulder shrug decreased on right XII-midline tongue extension Motor: 5/5 Left arm and leg, flaccid right arm and only 1/5 muscle strength on right hip,0/5 distal strength. Flaccid tone right arm and leg.  Sensory: Pinprick and light touch intact throughout, bilaterally Deep Tendon Reflexes: 2+ symmetric bilateral UE and depressed bilateral KJ, AJ.  Plantars up going on right and equivocal on left.  Cerebellar: Normal finger-to-nose on left smooth, heel-to-shin test left on right shows mild dysmetria. .     No results found for this basename: cbc, bmp, coags, chol, tri, ldl, hga1c    Results for orders placed during the hospital encounter of 03/11/12 (from the past 48 hour(s))  PROTIME-INR     Status: Normal   Collection Time   03/11/12  6:25 AM      Component Value Range Comment   Prothrombin Time 13.8  11.6 - 15.2 seconds    INR 1.04  0.00 - 1.49   APTT     Status: Normal   Collection Time   03/11/12  6:25 AM      Component Value Range Comment   aPTT 30  24 - 37 seconds   CBC     Status: Normal   Collection Time   03/11/12  6:25 AM      Component Value Range Comment   WBC 9.5  4.0 - 10.5 K/uL    RBC 4.58  4.22 - 5.81 MIL/uL    Hemoglobin 13.9  13.0 - 17.0 g/dL     HCT 40.9  81.1 - 91.4 %    MCV 87.8  78.0 - 100.0 fL    MCH 30.3  26.0 - 34.0 pg    MCHC 34.6  30.0 - 36.0 g/dL    RDW 78.2  95.6 - 21.3 %    Platelets 168  150 - 400 K/uL   DIFFERENTIAL     Status: Normal   Collection Time   03/11/12  6:25 AM      Component Value Range Comment   Neutrophils Relative 71  43 - 77 %    Neutro Abs 6.7  1.7 - 7.7 K/uL    Lymphocytes Relative 17  12 - 46 %    Lymphs Abs 1.6  0.7 - 4.0 K/uL    Monocytes Relative 10  3 - 12 %    Monocytes Absolute 1.0  0.1 - 1.0 K/uL    Eosinophils Relative 1  0 - 5 %    Eosinophils Absolute 0.1  0.0 - 0.7 K/uL    Basophils Relative 0  0 - 1 %    Basophils Absolute 0.0  0.0 - 0.1 K/uL   COMPREHENSIVE METABOLIC PANEL     Status: Abnormal   Collection Time   03/11/12  6:25 AM      Component Value Range Comment   Sodium 138  135 - 145 mEq/L    Potassium 4.6  3.5 - 5.1 mEq/L    Chloride 103  96 - 112 mEq/L    CO2 20  19 - 32 mEq/L    Glucose, Bld 181 (*) 70 - 99 mg/dL    BUN 42 (*) 6 - 23 mg/dL    Creatinine, Ser 0.86 (*) 0.50 - 1.35 mg/dL    Calcium 9.5  8.4 - 57.8 mg/dL    Total Protein 7.4  6.0 - 8.3 g/dL    Albumin 3.8  3.5 - 5.2 g/dL    AST 18  0 - 37 U/L    ALT 13  0 - 53 U/L    Alkaline Phosphatase 35 (*) 39 - 117 U/L    Total Bilirubin 0.3  0.3 - 1.2 mg/dL    GFR calc non Af Amer 35 (*) >90 mL/min    GFR calc Af Amer 40 (*) >90 mL/min   TROPONIN I     Status: Normal   Collection Time   03/11/12  6:25 AM      Component Value Range Comment   Troponin I <0.30  <0.30 ng/mL     Dg Chest 2 View  03/11/2012  *RADIOLOGY REPORT*  Clinical Data: Fall, right-sided weakness.  CHEST - 2 VIEW  Comparison: None  Findings: Heart and mediastinal contours are within normal limits. No focal opacities or effusions.  No acute bony abnormality.  IMPRESSION: No active cardiopulmonary disease.  Original Report Authenticated By: Cyndie Chime,  M.D.   Ct Head Wo Contrast  03/11/2012  *RADIOLOGY REPORT*  Clinical Data:  Found  down, slurred speech, right facial droop, recent CVA  CT HEAD WITHOUT CONTRAST CT CERVICAL SPINE WITHOUT CONTRAST  Technique:  Multidetector CT imaging of the head and cervical spine was performed following the standard protocol without intravenous contrast.  Multiplanar CT image reconstructions of the cervical spine were also generated.  Comparison:  None.  CT HEAD  Findings: No evidence of parenchymal hemorrhage or extra-axial fluid collection. No mass lesion, mass effect, or midline shift.  Hypodensity in the left basal ganglia/periventricular white matter, compatible with reported history of subacute infarct.  Additional possible hypodensity in the cortical/subcortical left frontal lobe (series 3/image 19), equivocal.  Subcortical white matter and periventricular small vessel ischemic changes.  Intracranial atherosclerosis.  Global cortical atrophy.  No ventriculomegaly.  The visualized paranasal sinuses are essentially clear.  The mastoid air cells are unopacified.  No evidence of calvarial fracture.  IMPRESSION: Hypodensity in the left basal ganglia/periventricular white matter, compatible with reported history of subacute infarct.  Atrophy with small vessel ischemic changes and intracranial atherosclerosis.  CT CERVICAL SPINE  Findings: Exaggerated cervical lordosis.  No evidence of fracture or dislocation.  Vertebral body heights are maintained.  The dens appears intact.  No prevertebral soft tissue swelling.  Moderate multilevel degenerative changes.  IMPRESSION: No evidence of traumatic injury to the cervical spine.  Moderate multilevel degenerative changes.  Original Report Authenticated By: Charline Bills, M.D.   Ct Cervical Spine Wo Contrast  03/11/2012  *RADIOLOGY REPORT*  Clinical Data:  Found down, slurred speech, right facial droop, recent CVA  CT HEAD WITHOUT CONTRAST CT CERVICAL SPINE WITHOUT CONTRAST  Technique:  Multidetector CT imaging of the head and cervical spine was performed following  the standard protocol without intravenous contrast.  Multiplanar CT image reconstructions of the cervical spine were also generated.  Comparison:  None.  CT HEAD  Findings: No evidence of parenchymal hemorrhage or extra-axial fluid collection. No mass lesion, mass effect, or midline shift.  Hypodensity in the left basal ganglia/periventricular white matter, compatible with reported history of subacute infarct.  Additional possible hypodensity in the cortical/subcortical left frontal lobe (series 3/image 19), equivocal.  Subcortical white matter and periventricular small vessel ischemic changes.  Intracranial atherosclerosis.  Global cortical atrophy.  No ventriculomegaly.  The visualized paranasal sinuses are essentially clear.  The mastoid air cells are unopacified.  No evidence of calvarial fracture.  IMPRESSION: Hypodensity in the left basal ganglia/periventricular white matter, compatible with reported history of subacute infarct.  Atrophy with small vessel ischemic changes and intracranial atherosclerosis.  CT CERVICAL SPINE  Findings: Exaggerated cervical lordosis.  No evidence of fracture or dislocation.  Vertebral body heights are maintained.  The dens appears intact.  No prevertebral soft tissue swelling.  Moderate multilevel degenerative changes.  IMPRESSION: No evidence of traumatic injury to the cervical spine.  Moderate multilevel degenerative changes.  Original Report Authenticated By: Charline Bills, M.D.     Assessment/Plan:   78 YO with history of DM, hypercholesterolemia and recent left posterior internal capsule stroke who presents with increasing right hemiplegia.  Initial CT of head shows recent stroke plus additional possible hypodensity in the cortical/subcortical left frontal lobe. Exam shows right hemiplegia with no sensory component suggesting a small vessel etiology, again referable to the BG.  Unclear significance of additional hypodensity seen on CT.  With recent infarct patient  is at increased risk of repeat event with his greatest risk being  within this time period.  He has had a recent work up but did not have an echo and may benefit from a more sensitive evaluation of his carotids now that he has experienced a recurrent event.  Differential includes A-A thrombis from left carotid not seen on original dopplers versus possible cardioembolic etiology versus small vessel disease with expansion of previous BG stroke.   Recommend: 1) 2 D echo 2) Rectal aspirin until passes swallow screen then switch to Plavix 75 mg daily 3) Patient will need further examination of carotid arteries.  Creatinine is slightly elevated.  Recommend hydration and CTA neck or MRA neck w/ contrast.   4) Continue Statin therapy 5) Telemetry 6) PT/OT    Felicie Morn PA-C [facility] [phone]  03/11/2012, 8:12 AM  Patient seen and examined.  Clinical course and management discussed.  Necessary edits performed.  I agree with the above.  Thana Farr, MD [facility] [phone]  03/11/2012  12:05 PM

## 2012-03-11 NOTE — ED Notes (Signed)
Pt's girlfriend brought pt's packet of info from [facility] where he was released Sat after suffering a stroke on [place] [place].  Packet given to [place], [name] for review.  Pt continues to c/o trouble breathing.  Pt's SPO2 97 - 98% on RA.  Placed pt on 2L via [place] for comfort.  Pt states that maybe he needs nose spray.  Pt in NAD.
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
## 2012-03-11 NOTE — ED Provider Notes (Signed)
History     CSN: [id number]  Arrival date & time 03/11/12  0604   First MD Initiated Contact with Patient 03/11/12 0606      Chief Complaint  Patient presents with  . Fall    (Consider location/radiation/quality/duration/timing/severity/associated sxs/prior treatment) The history is provided by the patient.  pt is a 78 y/o M with self-reported hx of CVA on 6/20 with d/c home yesterday from [facility] who presents to the ED with c/c of worsening right sided weakness and fall from bed this morning as a result. He denies any injury in the fall from bed this morning- reports he essentially slid to the floor. His main concern today is what he perceives to be worsening of his stroke sequelae, reporting worsening of right upper and lower extremity weakness overnight, as well as ?new facial droop and slurred speech. There was worsening of his symptoms prior to going to bed last night, though not as severe as current complaints. He endorses mild SOB with difficulty swallowing as well as mild nausea without emesis or pain complaints to any area. Denies HA, visual change, dizziness, or numbness to the extremities. Denies trouble coming up with words. Has not yet filled his lipitor rx, was not started on a new anticoagulant (was already taking aspirin). Has taken no medications since symptoms began.   No past medical history on file.  No past surgical history on file.  No family history on file.  History  Substance Use Topics  . Smoking status: Not on file  . Smokeless tobacco: Not on file  . Alcohol Use: Not on file      Review of Systems 10 systems reviewed and are negative for acute change except as noted in the HPI.  Allergies  Review of patient's allergies indicates not on file.  Home Medications  No current outpatient prescriptions on file.  BP 120/53  Pulse 76  Temp 97.5 F (36.4 C) (Oral)  Resp 17  SpO2 96%  Physical Exam  Nursing note reviewed. Constitutional:  He is oriented to person, place, and time. He appears well-developed and well-nourished. No distress.       Vital signs are reviewed and are normal.   HENT:  Head: Normocephalic and atraumatic.  Right Ear: External ear normal.  Left Ear: External ear normal.       MMM See neuro exam  Eyes: Conjunctivae and EOM are normal. Pupils are equal, round, and reactive to light.  Neck: Neck supple.  Cardiovascular: Normal rate, regular rhythm and normal heart sounds.        Bilateral radial and DP pulses are 2+   Pulmonary/Chest: Effort normal and breath sounds normal. No respiratory distress. He has no wheezes.  Abdominal: Soft. Bowel sounds are normal. He exhibits no distension. There is no tenderness.  Musculoskeletal: He exhibits no edema and no tenderness.  Neurological: He is alert and oriented to person, place, and time. A cranial nerve deficit (right sided facial droop sparing the forehead, tongue deviation, unable to shrug right shoulder) is present. No sensory deficit (intact to light touch in all extremities distally). Gait (unable) normal. GCS eye subscore is 4. GCS verbal subscore is 5. GCS motor subscore is 6.       Strength- RUE with 2/5 strength, able to move some in lateral plane. 0/5 grip strength to right. RLE with 1/5 strength- muscle fasciculation seen but no movement in any plane. LUE and LLE with 5/5 strength in all tested muscle groups.  Skin:  Skin is warm and dry.  Psychiatric: He has a normal mood and affect.    ED Course  Procedures (including critical care time)  Labs Reviewed  COMPREHENSIVE METABOLIC PANEL - Abnormal; Notable for the following:    Glucose, Bld 181 (*)     BUN 42 (*)     Creatinine, Ser 1.78 (*)     Alkaline Phosphatase 35 (*)     GFR calc non Af Amer 35 (*)     GFR calc Af Amer 40 (*)     All other components within normal limits  PROTIME-INR  APTT  CBC  DIFFERENTIAL  TROPONIN I  URINALYSIS, ROUTINE W REFLEX MICROSCOPIC   Dg Chest 2  View  03/11/2012  *RADIOLOGY REPORT*  Clinical Data: Fall, right-sided weakness.  CHEST - 2 VIEW  Comparison: None  Findings: Heart and mediastinal contours are within normal limits. No focal opacities or effusions.  No acute bony abnormality.  IMPRESSION: No active cardiopulmonary disease.  Original Report Authenticated By: Cyndie Chime, M.D.   Ct Head Wo Contrast  03/11/2012  *RADIOLOGY REPORT*  Clinical Data:  Found down, slurred speech, right facial droop, recent CVA  CT HEAD WITHOUT CONTRAST CT CERVICAL SPINE WITHOUT CONTRAST  Technique:  Multidetector CT imaging of the head and cervical spine was performed following the standard protocol without intravenous contrast.  Multiplanar CT image reconstructions of the cervical spine were also generated.  Comparison:  None.  CT HEAD  Findings: No evidence of parenchymal hemorrhage or extra-axial fluid collection. No mass lesion, mass effect, or midline shift.  Hypodensity in the left basal ganglia/periventricular white matter, compatible with reported history of subacute infarct.  Additional possible hypodensity in the cortical/subcortical left frontal lobe (series 3/image 19), equivocal.  Subcortical white matter and periventricular small vessel ischemic changes.  Intracranial atherosclerosis.  Global cortical atrophy.  No ventriculomegaly.  The visualized paranasal sinuses are essentially clear.  The mastoid air cells are unopacified.  No evidence of calvarial fracture.  IMPRESSION: Hypodensity in the left basal ganglia/periventricular white matter, compatible with reported history of subacute infarct.  Atrophy with small vessel ischemic changes and intracranial atherosclerosis.  CT CERVICAL SPINE  Findings: Exaggerated cervical lordosis.  No evidence of fracture or dislocation.  Vertebral body heights are maintained.  The dens appears intact.  No prevertebral soft tissue swelling.  Moderate multilevel degenerative changes.  IMPRESSION: No evidence of  traumatic injury to the cervical spine.  Moderate multilevel degenerative changes.  Original Report Authenticated By: Charline Bills, M.D.   Ct Cervical Spine Wo Contrast  03/11/2012  *RADIOLOGY REPORT*  Clinical Data:  Found down, slurred speech, right facial droop, recent CVA  CT HEAD WITHOUT CONTRAST CT CERVICAL SPINE WITHOUT CONTRAST  Technique:  Multidetector CT imaging of the head and cervical spine was performed following the standard protocol without intravenous contrast.  Multiplanar CT image reconstructions of the cervical spine were also generated.  Comparison:  None.  CT HEAD  Findings: No evidence of parenchymal hemorrhage or extra-axial fluid collection. No mass lesion, mass effect, or midline shift.  Hypodensity in the left basal ganglia/periventricular white matter, compatible with reported history of subacute infarct.  Additional possible hypodensity in the cortical/subcortical left frontal lobe (series 3/image 19), equivocal.  Subcortical white matter and periventricular small vessel ischemic changes.  Intracranial atherosclerosis.  Global cortical atrophy.  No ventriculomegaly.  The visualized paranasal sinuses are essentially clear.  The mastoid air cells are unopacified.  No evidence of calvarial fracture.  IMPRESSION: Hypodensity in  the left basal ganglia/periventricular white matter, compatible with reported history of subacute infarct.  Atrophy with small vessel ischemic changes and intracranial atherosclerosis.  CT CERVICAL SPINE  Findings: Exaggerated cervical lordosis.  No evidence of fracture or dislocation.  Vertebral body heights are maintained.  The dens appears intact.  No prevertebral soft tissue swelling.  Moderate multilevel degenerative changes.  IMPRESSION: No evidence of traumatic injury to the cervical spine.  Moderate multilevel degenerative changes.  Original Report Authenticated By: Charline Bills, M.D.    Date: 03/11/2012  Rate: 76  Rhythm: sinus  QRS Axis:  left  Intervals: normal  ST/T Wave abnormalities: normal  Conduction Disutrbances:none  Narrative Interpretation: early precordial r/s transition  Old EKG Reviewed: none available    1. CVA (cerebral vascular accident)   2. Diabetes mellitus       MDM  Pt with hx DM, recent CVA- progression of CVA sx with evidence of new CN involvement at this time. Suspect new infarct. CT head, stroke labs ordered- CBC, PT, PTT, troponin WNL. CMP with hyperglycemia, worsened renal function (suspect slight dehydration). CT head with evidence of subacute infarct, ?hypodensity in left frontal lobe. Pt will require admission to the hospital for further evaluation (echo not performed during prior admission) and eval for need for rehab placement.      8:04 AM Attending MD spoke with Dr Thad Ranger, neurology, who advises medicine admission and neurology will consult.    8:35 AM Spoke with [facility] for admission- bed request to be placed.  Shaaron Adler, [place] 03/11/12 [phone]

## 2012-03-11 NOTE — Evaluation (Signed)
Clinical/Bedside Swallow Evaluation Patient Details  Name: James Bell MRN: [id number] Date of Birth: December 03, 1930  Today's Date: 03/11/2012 Time: 9562-1308 SLP Time Calculation (min): 19 min  Past Medical History:  Past Medical History  Diagnosis Date  . Hypercholesteremia   . Diabetes mellitus   . Stroke    Past Surgical History:  Past Surgical History  Procedure Date  . Tonsillectomy   . Prostate surgery     Laser  . Hernia repair    HPI:  78 y.o. male with initial left internal capsule CVA on 6/20, was d/c'd home and started experienceing worsening symptoms of slurred speech and right hemiparesis.  CT of brain showed hypodensity in the left basal ganglia/periventricular white matter.  Await MRI results.  Patient did not pass the RN stroke swallow screen due to report of a h/o dysphagia.   Assessment / Plan / Recommendation Clinical Impression  Demonstrates a mild oral and suspected pharyngeal phase dysphagia given immediate throat clearing and coughing with thin liquids as well as patient report "I think that went in my trachea."  Patient's friend reported that he habitually clears throat throughout meals prior to this neuro event.  Suspect sensed aspiration, however unable to determine the most effective airway protection method without an objective measurement.  Will proceed with a MBSS on 6/25.  Will also complete a cognitive-linguistic evaluation as there is evidence of dysarthria and an aphasia versus apraxia.    Aspiration Risk  Moderate    Diet Recommendation Dysphagia 3 (Mechanical Soft);Nectar-thick liquid   Liquid Administration via: Cup Medication Administration: Whole meds with puree Supervision: Patient able to self feed;Intermittent supervision to cue for compensatory strategies Compensations: Slow rate;Small sips/bites;Check for pocketing Postural Changes and/or Swallow Maneuvers: Seated upright 90 degrees;Upright 30-60 min after meal    Other   Recommendations Recommended Consults: MBS Oral Care Recommendations: Oral care BID;Patient independent with oral care Other Recommendations: Order thickener from pharmacy;Prohibited food (jello, ice cream, thin soups);Remove water pitcher;Clarify dietary restrictions   Follow Up Recommendations  Inpatient Rehab    Frequency and Duration min 3x week  2 weeks   Pertinent Vitals/Pain n/a    SLP Swallow Goals Patient will consume recommended diet without observed clinical signs of aspiration with: Supervision/safety Patient will utilize recommended strategies during swallow to increase swallowing safety with: Supervision/safety   Swallow Study Oral/Motor/Sensory Function Overall Oral Motor/Sensory Function: Impaired Labial ROM: Reduced right Labial Symmetry: Abnormal symmetry right Labial Strength: Reduced Labial Sensation: Reduced Lingual ROM: Reduced right Lingual Symmetry: Abnormal symmetry right Lingual Strength: Reduced Lingual Sensation: Reduced Facial ROM: Reduced right Facial Symmetry: Right droop Facial Strength: Reduced Facial Sensation: Reduced Velum: Within Functional Limits Mandible: Within Functional Limits   Ice Chips Ice chips: Not tested   Thin Liquid Thin Liquid: Impaired Presentation: Self Fed;Spoon Pharyngeal  Phase Impairments: Wet Vocal Quality;Cough - Immediate;Throat Clearing - Immediate    Nectar Thick Nectar Thick Liquid: Impaired Presentation: Self Fed;Cup Pharyngeal Phase Impairments: Throat Clearing - Immediate   Honey Thick Honey Thick Liquid: Not tested   Puree Puree: Within functional limits Presentation: Self Fed   Solid Solid: Impaired Presentation: Self Fed Oral Phase Functional Implications: Oral residue    Myra Rude, M.S.,CCC-SLP Pager [phone] 03/11/2012,3:22 PM

## 2012-03-11 NOTE — Progress Notes (Signed)
PT Cancellation Note  Treatment cancelled today due to patient receiving procedure or test. Pt off the floor for MRI. Will f/u for PT eval tomorrow.   [facility] HELEN 03/11/2012, 2:22 PM Pager: [phone]

## 2012-03-12 ENCOUNTER — Inpatient Hospital Stay (HOSPITAL_COMMUNITY): Payer: Medicare Other

## 2012-03-12 DIAGNOSIS — G8190 Hemiplegia, unspecified affecting unspecified side: Secondary | ICD-10-CM

## 2012-03-12 DIAGNOSIS — I6340 Cerebral infarction due to embolism of unspecified cerebral artery: Secondary | ICD-10-CM

## 2012-03-12 DIAGNOSIS — IMO0001 Reserved for inherently not codable concepts without codable children: Secondary | ICD-10-CM

## 2012-03-12 DIAGNOSIS — E1165 Type 2 diabetes mellitus with hyperglycemia: Secondary | ICD-10-CM

## 2012-03-12 DIAGNOSIS — I6330 Cerebral infarction due to thrombosis of unspecified cerebral artery: Secondary | ICD-10-CM

## 2012-03-12 DIAGNOSIS — I6789 Other cerebrovascular disease: Secondary | ICD-10-CM | POA: Diagnosis not present

## 2012-03-12 LAB — LIPID PANEL
Cholesterol: 171 mg/dL (ref 0–200)
HDL: 16 mg/dL — ABNORMAL LOW (ref >39)
LDL Cholesterol: 109 mg/dL — ABNORMAL HIGH (ref 0–99)
Total CHOL/HDL Ratio: 10.7 RATIO
Triglycerides: 231 mg/dL — ABNORMAL HIGH (ref <150)
VLDL: 46 mg/dL — ABNORMAL HIGH (ref 0–40)

## 2012-03-12 LAB — GLUCOSE, CAPILLARY
Glucose-Capillary: 133 mg/dL — ABNORMAL HIGH (ref 70–99)
Glucose-Capillary: 141 mg/dL — ABNORMAL HIGH (ref 70–99)
Glucose-Capillary: 145 mg/dL — ABNORMAL HIGH (ref 70–99)
Glucose-Capillary: 174 mg/dL — ABNORMAL HIGH (ref 70–99)
Glucose-Capillary: 142 mg/dL — ABNORMAL HIGH (ref 70–99)
Glucose-Capillary: 129 mg/dL — ABNORMAL HIGH (ref 70–99)
Glucose-Capillary: 158 mg/dL — ABNORMAL HIGH (ref 70–99)

## 2012-03-12 LAB — BASIC METABOLIC PANEL
BUN: 31 mg/dL — ABNORMAL HIGH (ref 6–23)
CO2: 22 mEq/L (ref 19–32)
Calcium: 9.4 mg/dL (ref 8.4–10.5)
Chloride: 100 mEq/L (ref 96–112)
Creatinine, Ser: 1.33 mg/dL (ref 0.50–1.35)
GFR calc Af Amer: 57 mL/min — ABNORMAL LOW (ref >90)
GFR calc non Af Amer: 50 mL/min — ABNORMAL LOW (ref >90)
Glucose, Bld: 148 mg/dL — ABNORMAL HIGH (ref 70–99)
Potassium: 4.1 mEq/L (ref 3.5–5.1)
Sodium: 135 mEq/L (ref 135–145)

## 2012-03-12 MED ORDER — HYDROCODONE-ACETAMINOPHEN 5-325 MG PO TABS
1.0000 | ORAL_TABLET | Freq: Once | ORAL | Status: AC
  Administered 2012-03-12: 1 via ORAL
  Filled 2012-03-12: qty 1

## 2012-03-12 MED ORDER — INSULIN ASPART 100 UNIT/ML ~~LOC~~ SOLN
0.0000 [IU] | Freq: Three times a day (TID) | SUBCUTANEOUS | Status: DC
  Administered 2012-03-12: 2 [IU] via SUBCUTANEOUS
  Administered 2012-03-12: 3 [IU] via SUBCUTANEOUS
  Administered 2012-03-12: 2 [IU] via SUBCUTANEOUS
  Administered 2012-03-13: 3 [IU] via SUBCUTANEOUS
  Administered 2012-03-13: 5 [IU] via SUBCUTANEOUS

## 2012-03-12 MED ORDER — FOOD THICKENER (THICKENUP CLEAR)
ORAL | Status: DC | PRN
  Filled 2012-03-12: qty 120

## 2012-03-12 MED ORDER — GI COCKTAIL ~~LOC~~
30.0000 mL | Freq: Once | ORAL | Status: AC
  Administered 2012-03-12: 30 mL via ORAL
  Filled 2012-03-12: qty 30

## 2012-03-12 MED ORDER — PANTOPRAZOLE SODIUM 40 MG PO TBEC
40.0000 mg | DELAYED_RELEASE_TABLET | Freq: Every day | ORAL | Status: DC
  Administered 2012-03-12 – 2012-03-13 (×2): 40 mg via ORAL
  Filled 2012-03-12 (×2): qty 1

## 2012-03-12 MED ORDER — ATORVASTATIN CALCIUM 40 MG PO TABS
40.0000 mg | ORAL_TABLET | Freq: Every day | ORAL | Status: DC
  Filled 2012-03-12 (×2): qty 1

## 2012-03-12 MED ORDER — ALUM & MAG HYDROXIDE-SIMETH 200-200-20 MG/5ML PO SUSP
15.0000 mL | ORAL | Status: DC | PRN
  Administered 2012-03-12: 14:00:00 via ORAL
  Filled 2012-03-12: qty 30

## 2012-03-12 MED ORDER — DULOXETINE HCL 60 MG PO CPEP
60.0000 mg | ORAL_CAPSULE | Freq: Every day | ORAL | Status: DC
  Administered 2012-03-12 – 2012-03-13 (×2): 60 mg via ORAL
  Filled 2012-03-12 (×2): qty 1

## 2012-03-12 NOTE — Procedures (Signed)
EEG NUMBER:  REFERRING PHYSICIAN:  Osvaldo Shipper, MD  HISTORY:  A 78 year old male with left-sided weakness.  MEDICATIONS:  Aspirin, Cymbalta, Lantus, Prinivil, Zestril, Mirapex, Crestor, Janumet, and Plavix.  CONDITIONS OF RECORDING:  This is a 16-channel EEG carried out the patient in the awake, drowsy, and asleep states.  DESCRIPTION:  The waking background activity consists of a low-voltage, symmetrical, fairly well-organized 9-10 Hz alpha activity seen from the parieto-occipital and posterotemporal regions.  Low-voltage, fast activity, poorly organized was seen anteriorly at times superimposed on more posterior rhythms.  A mixture of theta and alpha rhythm was seen from the central and temporal regions.  The patient drowses with slowing to irregular, low-voltage theta and beta activity.  The patient goes into a light sleep with symmetrical sleep spindles, vertex with sharp activity and irregular slow activity.  Hyperventilation was not performed.  Intermittent photic stimulation produces a symmetrical driving response, but failed to elicit any abnormalities.  IMPRESSION:  This is a normal EEG.  No epileptiform activity is noted.          ______________________________ Thana Farr, MD    HT:XHFS D:  03/12/2012 09:57:36  T:  03/12/2012 12:14:58  Job #:  [id number]

## 2012-03-12 NOTE — Progress Notes (Signed)
Per Rehab MD consult, patient would be an excellent CIR candidate. I discussed with patient and significant other at the bedside. Patient is very motivated. Will f/u in am. Marina Goodell adm coordinator [phone].

## 2012-03-12 NOTE — Evaluation (Signed)
Speech Language Pathology Evaluation Patient Details Name: James Bell MRN: [id number] DOB: [birth year] Today's Date: 03/12/2012 Time: 1320-1400 SLP Time Calculation (min): 40 min  Problem List:  Patient Active Problem List  Diagnosis  . CVA (cerebrovascular accident)  . Right hemiparesis  . DM type 2 (diabetes mellitus, type 2)  . OSA (obstructive sleep apnea)  . Elevated serum creatinine   Past Medical History:  Past Medical History  Diagnosis Date  . Hypercholesteremia   . Restless leg syndrome   . Sleep apnea   . Type II diabetes mellitus   . H/O hiatal hernia   . GERD (gastroesophageal reflux disease)   . Headache 03/11/12    "up until ~ 2 yr ago I had them daily"  . Stroke 03/07/12    slurred speech; right hemplegia  . Basal cell carcinoma of nose 2002    "bridge of nose"  . History of kidney problems 12/01/10    "lacerated kidney"   Past Surgical History:  Past Surgical History  Procedure Date  . Prostate surgery 2010    Laser  . Tonsillectomy and adenoidectomy ~ 1945  . Hemorrhoid surgery 1991  . Cataract extraction w/ intraocular lens implant 11//2012 - 08/2011  . Cardiac catheterization 2004    "normal"  . Inguinal hernia repair 1981; 2002    left; "repaired left"  . Skin cancer excision 2002    bridge of nose   HPI:  78 y.o. male with initial left internal capsule CVA on 6/20, was d/c'd home and started experiencing worsening symptoms of slurred speech and right hemiparesis.  CT of brain showed hypodensity in the left basal ganglia/periventricular white matter. MRI results from 6/24 revealed a moderate sized area of acute infarction affects the left posterior basal ganglia and periventricular white matter without hemorrhage.     Assessment / Plan / Recommendation Clinical Impression  Demonstrates a moderate mixed dysarthria with a questionable verbal apraxic component, impacting his functional communication, especially with unfamiliar listeners.   Patient will benefit from skilled SLP services to maximize his functional communicative abilites.  Additionally, patient will benefit from an inpatient rehab stay.    SLP Assessment  Patient needs continued Speech Language Pathology Services    Follow Up Recommendations  Inpatient Rehab    Frequency and Duration min 3x week  2 weeks   Pertinent Vitals/Pain n/a   SLP Goals  SLP Goals Potential to Achieve Goals: Good Progress/Goals/Alternative treatment plan discussed with pt/caregiver and they: Agree SLP Goal #1: Patient will verbalize his wants/needs with supervision level semantic cues. SLP Goal #2: Patient will use a slow rate of speech and pacing at sentence level with supervision level verbal cues. SLP Goal #3: Patient will demonstrate safe decision making with ADLs with supervision level verbal cues.  SLP Evaluation Prior Functioning  Cognitive/Linguistic Baseline: Within functional limits Type of Home: House Lives With: Alone Available Help at Discharge: Family;Friend(s) Education: Medical School; Photographer Vocation: Part time employment Engineer, drilling)   Cognition  Overall Cognitive Status: Impaired Arousal/Alertness: Awake/alert Orientation Level: Oriented X4 Attention: Sustained;Selective Sustained Attention: Appears intact Selective Attention: Impaired Selective Attention Impairment: Verbal complex Memory: Appears intact Awareness: Impaired Awareness Impairment: Emergent impairment Problem Solving: Appears intact Safety/Judgment: Appears intact (Further skilled observation is warranted)    Comprehension  Auditory Comprehension Overall Auditory Comprehension: Appears within functional limits for tasks assessed Reading Comprehension Reading Status: Within funtional limits    Expression Expression Primary Mode of Expression: Verbal Verbal Expression Overall Verbal Expression: Impaired Initiation: No  impairment Level of Generative/Spontaneous  Verbalization: Sentence Repetition: No impairment Naming: No impairment Pragmatics: Impairment Impairments:  (Questionable right field inattention) Interfering Components: Attention Written Expression Dominant Hand: Right Written Expression: Within Functional Limits   Oral / Motor Oral Motor/Sensory Function Overall Oral Motor/Sensory Function: Impaired Labial ROM: Reduced right Labial Symmetry: Abnormal symmetry right Labial Strength: Reduced Labial Sensation: Reduced Lingual ROM: Reduced right Lingual Symmetry: Abnormal symmetry right Lingual Strength: Reduced Lingual Sensation: Reduced Facial ROM: Reduced right Facial Symmetry: Right droop Facial Strength: Reduced Facial Sensation: Reduced Velum: Within Functional Limits Mandible: Within Functional Limits Motor Speech Overall Motor Speech: Impaired Respiration: Impaired Level of Impairment: Sentence Phonation: Normal Resonance: Within functional limits Articulation: Impaired Level of Impairment: Phrase Intelligibility: Intelligibility reduced Word: 75-100% accurate Phrase: 75-100% accurate Sentence: 75-100% accurate Conversation: 75-100% accurate Motor Planning: Impaired Level of Impairment: Secretary/administrator Errors: Aware;Inconsistent Effective Techniques: Slow rate;Over-articulate;Pacing     Myra Rude, M.S.,CCC-SLP Pager [phone] 03/12/2012, 2:57 PM

## 2012-03-12 NOTE — Discharge Instructions (Signed)
STROKE/TIA DISCHARGE INSTRUCTIONS SMOKING Cigarette smoking nearly doubles your risk of having a stroke & is the single most alterable risk factor  If you smoke or have smoked in the last 12 months, you are advised to quit smoking for your health.  Most of the excess cardiovascular risk related to smoking disappears within a year of stopping.  Ask you doctor about anti-smoking medications  [place] Quit Line: 1-800-QUIT NOW  Free Smoking Cessation Classes [phone]  CHOLESTEROL Know your levels; limit fat & cholesterol in your diet  Lipid Panel     Component Value Date/Time   CHOL 171 03/12/2012 0530   TRIG 231* 03/12/2012 0530   HDL 16* 03/12/2012 0530   CHOLHDL 10.7 03/12/2012 0530   VLDL 46* 03/12/2012 0530   LDLCALC 109* 03/12/2012 0530      Many patients benefit from treatment even if their cholesterol is at goal.  Goal: Total Cholesterol (CHOL) less than 160  Goal:  Triglycerides (TRIG) less than 150  Goal:  HDL greater than 40  Goal:  LDL (LDLCALC) less than 100   BLOOD PRESSURE American Stroke Association blood pressure target is less that 120/80 mm/Hg  Your discharge blood pressure is:  BP: 149/79 mmHg  Monitor your blood pressure  Limit your salt and alcohol intake  Many individuals will require more than one medication for high blood pressure  DIABETES (A1c is a blood sugar average for last 3 months) Goal HGBA1c is under 7% (HBGA1c is blood sugar average for last 3 months)  Diabetes: {STROKE DC DIABETES:22357}    Lab Results  Component Value Date   HGBA1C 6.3* 03/11/2012     Your HGBA1c can be lowered with medications, healthy diet, and exercise.  Check your blood sugar as directed by your physician  Call your physician if you experience unexplained or low blood sugars.  PHYSICAL ACTIVITY/REHABILITATION Goal is 30 minutes at least 4 days per week    {STROKE DC ACTIVITY/REHAB:22359}  Activity decreases your risk of heart attack and stroke and makes your heart  stronger.  It helps control your weight and blood pressure; helps you relax and can improve your mood.  Participate in a regular exercise program.  Talk with your doctor about the best form of exercise for you (dancing, walking, swimming, cycling).  DIET/WEIGHT Goal is to maintain a healthy weight  Your discharge diet is: Dysphagia *** liquids Your height is:  Height: 5\' 11"  (180.3 cm) Your current weight is: Weight: 81.647 kg (180 lb) Your Body Mass Index (BMI) is:  BMI (Calculated): 25.2   Following the type of diet specifically designed for you will help prevent another stroke.  Your goal weight range is:  ***  Your goal Body Mass Index (BMI) is 19-24.  Healthy food habits can help reduce 3 risk factors for stroke:  High cholesterol, hypertension, and excess weight.  RESOURCES Stroke/Support Group:  Call [phone]  they meet the 3rd Sunday of the month on the Rehab Unit at [facility], [place] ( no meetings June, July & Aug).  STROKE EDUCATION PROVIDED/REVIEWED AND GIVEN TO PATIENT Stroke warning signs and symptoms How to activate emergency medical system (call 911). Medications prescribed at discharge. Need for follow-up after discharge. Personal risk factors for stroke. Pneumonia vaccine given:   {STROKE DC YES/NO/DATE:22363} Flu vaccine given:   {STROKE DC YES/NO/DATE:22363} My questions have been answered, the writing is legible, and I understand these instructions.  I will adhere to these goals & educational materials that have been provided  to me after my discharge from the hospital.

## 2012-03-12 NOTE — Evaluation (Signed)
Physical Therapy Evaluation Patient Details Name: NICKHOLAS GOLDSTON MRN: [id number] DOB: 16-May-1931 Today's Date: 03/12/2012 Time: 9147-8295 PT Time Calculation (min): 25 min  PT Assessment / Plan / Recommendation Clinical Impression  Pt is 78 y/o with recent admission to hospital in [place] [facility] due to right sided weakness with + CVA.  Pt d/c home and in one day pt with progressive right sided weakness.  Pt admitted to [facility] for right sided hemiparesis and positive for left putamen CVA.  Pt will benefit from acute PT services to improve overall mobility prior to d/c to next venue.      PT Assessment  Patient needs continued PT services    Follow Up Recommendations  Inpatient Rehab    Barriers to Discharge        lEquipment Recommendations  Defer to next venue    Recommendations for Other Services Rehab consult   Frequency Min 4X/week    Precautions / Restrictions Precautions Precautions: Fall Restrictions Weight Bearing Restrictions: No   Pertinent Vitals/Pain No c/o pain      Mobility  Bed Mobility Bed Mobility: Supine to Sit;Sitting - Scoot to Edge of Bed Supine to Sit: 1: +2 Total assist Supine to Sit: Patient Percentage: 50% Sitting - Scoot to Edge of Bed: 1: +2 Total assist Sitting - Scoot to Edge of Bed: Patient Percentage: 50% Details for Bed Mobility Assistance: (A) to elevate trunk OOB with cues for proper technique;  (A) to scoot hips EOB using pad and cues for proper weight shift Transfers Transfers: Sit to Stand;Stand to Sit Sit to Stand: 1: +2 Total assist;From bed Sit to Stand: Patient Percentage: 60% Stand to Sit: 1: +2 Total assist;To chair/3-in-1 Stand to Sit: Patient Percentage: 60% Stand Pivot Transfers: 1: +2 Total assist Stand Pivot Transfers: Patient Percentage: 40% Details for Transfer Assistance: (A) to initiate transfer with max cues for technique; (A) to slowly descend to recliner without right knee buckling. (A) for proper weight shift to  advance RLE with assistance and (A) to maintain balance during weight shift to left side.  Noticeable right knee buckling initially with occasional hyperextension Modified Rankin (Stroke Patients Only) Pre-Morbid Rankin Score: No symptoms Modified Rankin: Severe disability    Exercises     PT Diagnosis: Difficulty walking;Generalized weakness;Abnormality of gait  PT Problem List: Decreased strength;Decreased activity tolerance;Decreased balance;Decreased mobility;Decreased coordination;Decreased knowledge of use of DME PT Treatment Interventions: DME instruction;Gait training;Stair training;Functional mobility training;Therapeutic activities;Therapeutic exercise;Balance training;Neuromuscular re-education;Patient/family education   PT Goals Acute Rehab PT Goals PT Goal Formulation: With patient/family Time For Goal Achievement: 03/26/12 Potential to Achieve Goals: Good Pt will go Supine/Side to Sit: with min assist PT Goal: Supine/Side to Sit - Progress: Goal set today Pt will Sit at Edge of Bed: with supervision;3-5 min PT Goal: Sit at Edge Of Bed - Progress: Goal set today Pt will go Sit to Supine/Side: with min assist PT Goal: Sit to Supine/Side - Progress: Goal set today Pt will go Sit to Stand: with min assist PT Goal: Sit to Stand - Progress: Goal set today Pt will go Stand to Sit: with min assist PT Goal: Stand to Sit - Progress: Goal set today Pt will Transfer Bed to Chair/Chair to Bed: with mod assist PT Transfer Goal: Bed to Chair/Chair to Bed - Progress: Goal set today Pt will Stand: with min assist;1 - 2 min PT Goal: Stand - Progress: Goal set today Pt will Ambulate: 16 - 50 feet;with +2 total assist PT Goal: Ambulate - Progress: Goal  set today  Visit Information  Last PT Received On: 03/12/12 Assistance Needed: +2 PT/OT Co-Evaluation/Treatment: Yes    Subjective Data  Subjective: "I can't get out of bed." Patient Stated Goal: Pt agreeable to rehab   Prior  Functioning  Home Living Lives With: Alone Available Help at Discharge: Family;Friend(s) Type of Home: House Home Access: Stairs to enter [employer] of Steps: 1 Entrance Stairs-Rails: None Home Layout: One level Bathroom Shower/Tub: Tub/shower unit;Walk-in shower Bathroom Toilet: Handicapped height Bathroom Accessibility: Yes How Accessible: Accessible via walker Home Adaptive Equipment: Grab bars in shower;Shower chair with back;Walker - standard Prior Function Level of Independence: Independent Able to Take Stairs?: Yes Vocation: Part time employment (opthamologist) Dominant Hand: Right    Cognition  Overall Cognitive Status: Difficult to assess (due to very little verbalization but seems [facility]) Difficult to assess due to: Impaired communication Arousal/Alertness: Awake/alert Orientation Level: Appears intact for tasks assessed Behavior During Session: Flat affect    Extremity/Trunk Assessment Right Lower Extremity Assessment RLE ROM/Strength/Tone: Deficits RLE ROM/Strength/Tone Deficits: 2/5 quad; 2+/5 hamstring; 2/5 hip flexors; trace DF RLE Sensation: WFL - Light Touch Left Lower Extremity Assessment LLE ROM/Strength/Tone: Within functional levels LLE Sensation: WFL - Light Touch   Balance Balance Balance Assessed: Yes Static Sitting Balance Static Sitting - Balance Support: Feet supported Static Sitting - Level of Assistance: 3: Mod assist;4: Min assist Static Sitting - Comment/# of Minutes: ~ 5 minutes; initial mod (A) to find midline and prevent right lean then needed min (A) to maintain balance during MMT;  intermittent supervision  End of Session PT - End of Session Equipment Utilized During Treatment: Gait belt Activity Tolerance: Patient tolerated treatment well Patient left: in chair;with call bell/phone within reach Nurse Communication: Mobility status   Anoushka Divito 03/12/2012, 3:51 PM Jake Shark, PT DPT [phone]

## 2012-03-12 NOTE — Procedures (Signed)
Objective Swallowing Evaluation: Modified Barium Swallowing Study  Patient Details  Name: James Bell MRN: [id number] Date of Birth: 17-Feb-1931  Today's Date: 03/12/2012 Time: 0900-0930 SLP Time Calculation (min): 30 min  Past Medical History:  Past Medical History  Diagnosis Date  . Hypercholesteremia   . Restless leg syndrome   . Sleep apnea   . Type II diabetes mellitus   . H/O hiatal hernia   . GERD (gastroesophageal reflux disease)   . Headache 03/11/12    "up until ~ 2 yr ago I had them daily"  . Stroke 03/07/12    slurred speech; right hemplegia  . Basal cell carcinoma of nose 2002    "bridge of nose"  . History of kidney problems 12/01/10    "lacerated kidney"   Past Surgical History:  Past Surgical History  Procedure Date  . Prostate surgery 2010    Laser  . Tonsillectomy and adenoidectomy ~ 1945  . Hemorrhoid surgery 1991  . Cataract extraction w/ intraocular lens implant 11//2012 - 08/2011  . Cardiac catheterization 2004    "normal"  . Inguinal hernia repair 1981; 2002    left; "repaired left"  . Skin cancer excision 2002    bridge of nose   HPI:  78 y.o. male with initial left internal capsule CVA on 6/20, was d/c'd home and started experienceing worsening symptoms of slurred speech and right hemiparesis.  CT of brain showed hypodensity in the left basal ganglia/periventricular white matter. MRI results from 6/24 revealed a moderate sized area of acute infarction affects the left posterior basal ganglia and periventricular white matter without hemorrhage.  BSE on 6/24 demonstrated overt s/s of a likely pharyngeal phase dysphagia.  MBSS today to determine airway protection compensatory strategy use.     Assessment / Plan / Recommendation Clinical Impression  Dysphagia Diagnosis: Mild oral phase dysphagia;Mild pharyngeal phase dysphagia Clinical impression: Demonstrates a primary sensory based oral-pharyngeal dysphagia with a mild motor weakness of the  base of tongue and laryngeal function.  Sensory deficits result in premature loss of oral bolus directly into the laryngeal vestibule (penetration) before the swallow response with inconsistent throat clears and inconsistent silent aspiration with thin liquid trials.  All other consistencies were [facility].  Suspect this is an acute reversible neurologic dysphagia with likely resolution within 5-7 days given great improvement from yesterday's overt coughing episodes with thin liquids. Will complete cognitive-linguistic evaluation today.    Treatment Recommendation  Therapy as outlined in treatment plan below    Diet Recommendation Dysphagia 3 (Mechanical Soft);Nectar-thick liquid   Liquid Administration via: Cup;Straw Medication Administration: Whole meds with liquid Supervision: Patient able to self feed;Intermittent supervision to cue for compensatory strategies Compensations: Slow rate;Small sips/bites;Check for pocketing Postural Changes and/or Swallow Maneuvers: Seated upright 90 degrees;Upright 30-60 min after meal    Other  Recommendations Oral Care Recommendations: Oral care BID;Patient independent with oral care Other Recommendations: Order thickener from pharmacy;Prohibited food (jello, ice cream, thin soups);Remove water pitcher;Clarify dietary restrictions   Follow Up Recommendations  Inpatient Rehab    Frequency and Duration min 3x week  2 weeks   Pertinent Vitals/Pain n/a    SLP Swallow Goals Patient will consume recommended diet without observed clinical signs of aspiration with: Supervision/safety Patient will utilize recommended strategies during swallow to increase swallowing safety with: Supervision/safety Goal #3: Patient will consume thin liquid trials, with SLP only, utilizing oral hold of 3 seconds followed by a hard cough post-swallow with minimal verbal/demonstration cues.   Reason for Referral  Objectively evaluate swallowing function    Myra Rude,  M.S.,CCC-SLP Pager [phone] 03/12/2012, 9:49 AM

## 2012-03-12 NOTE — Consult Note (Signed)
Physical Medicine and Rehabilitation Consult Reason for Consult: stroke Referring Physician: Dr. Pearlean Brownie   HPI: James Bell is a 78 y.o. right-handed male with history of diabetes mellitus and hyperlipidemia. 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 MRI revealed moderate size acute infarct affecting the left posterior putamen and left periventricular white matter. MRA of the head was unremarkable. Carotid Dopplers with no ICA stenosis. Echocardiogram was pending. Neurology consulted placed on Plavix therapy. Patient was not a TPA candidate. Subcutaneous Lovenox added for DVT prophylaxis. Placed on a dysphagia 3 nectar thick liquid diet per speech therapy. Physical therapy ongoing with recommendations for physical medicine rehabilitation consult to consider inpatient rehabilitation services   Review of Systems  Musculoskeletal: Positive for back pain.  Neurological: Positive for weakness.       Restless legs  All other systems reviewed and are negative.   Past Medical History  Diagnosis Date  . Hypercholesteremia   . Restless leg syndrome   . Sleep apnea   . Type II diabetes mellitus   . H/O hiatal hernia   . GERD (gastroesophageal reflux disease)   . Headache 03/11/12    "up until ~ 2 yr ago I had them daily"  . Stroke 03/07/12    slurred speech; right hemplegia  . Basal cell carcinoma of nose 2002    "bridge of nose"  . History of kidney problems 12/01/10    "lacerated kidney"   Past Surgical History  Procedure Date  . Prostate surgery 2010    Laser  . Tonsillectomy and adenoidectomy ~ 1945  . Hemorrhoid surgery 1991  . Cataract extraction w/ intraocular lens implant 11//2012 - 08/2011  . Cardiac catheterization 2004    "normal"  . Inguinal hernia repair 1981; 2002    left; "repaired left"  . Skin cancer excision 2002    bridge of nose   History reviewed. No pertinent family history. Social History:  reports that he  quit smoking about 35 years ago. His smoking use included Pipe. He has never used smokeless tobacco. He reports that he drinks about 2.4 ounces of alcohol per week. He reports that he does not use illicit drugs. Allergies: No Known Allergies Medications Prior to Admission  Medication Sig Dispense Refill  . aspirin 325 MG EC tablet Take 325 mg by mouth daily.      . DULoxetine (CYMBALTA) 60 MG capsule Take 60 mg by mouth daily.      . insulin glargine (LANTUS) 100 UNIT/ML injection Inject 44 Units into the skin at bedtime.      Marland Kitchen lisinopril (PRINIVIL,ZESTRIL) 10 MG tablet Take 10 mg by mouth daily.      . pramipexole (MIRAPEX) 1 MG tablet Take 1 mg by mouth daily.      . rosuvastatin (CRESTOR) 5 MG tablet Take 5 mg by mouth daily.      . sitaGLIPtan-metformin (JANUMET) 50-1000 MG per tablet Take 1 tablet by mouth 2 (two) times daily with a meal.        Home:    Functional History:   Functional Status:  Mobility:          ADL:    Cognition: Cognition Orientation Level: Oriented X4    Blood pressure 140/72, pulse 80, temperature 97.5 F (36.4 C), temperature source Oral, resp. rate 18, height 5\' 11"  (1.803 m), weight 81.647 kg (180 lb), SpO2 95.00%. Physical Exam  Constitutional: He is oriented to person, place, and time. He appears well-developed and well-nourished.  HENT:  Head: Normocephalic and atraumatic.  Right Ear: External ear normal.  Left Ear: External ear normal.  Nose: Nose normal.  Mouth/Throat: Oropharynx is clear and moist.  Eyes: Conjunctivae and EOM are normal. Pupils are equal, round, and reactive to light. Right eye exhibits no discharge. Left eye exhibits no discharge.  Neck: Neck supple. No thyromegaly present.  Cardiovascular: Normal rate and regular rhythm.   Pulmonary/Chest: Effort normal and breath sounds normal. No respiratory distress.  Abdominal: He exhibits no distension. There is no tenderness.  Neurological: He is alert and oriented to  person, place, and time.       Mildly dysarthric speech but intelligible. He follows basic commands. Right central 7 and tongue deviation. No sensory loss on the right. Right upper extremity grossly trace at the deltoid and bicep. Absent at the wrist and hand intrinsics. Right lower extremity grossly 2/5 proximal to 3+/4/5 distally. Toes are upgoing. Reflexes 1+. Cognitively he has good insight and awareness.  Skin: Skin is warm and dry.  Psychiatric: He has a normal mood and affect. His behavior is normal. Judgment and thought content normal.       Patient does display some decreased insight into his deficits    Results for orders placed during the hospital encounter of 03/11/12 (from the past 24 hour(s))  GLUCOSE, CAPILLARY     Status: Abnormal   Collection Time   03/11/12 11:41 AM      Component Value Range   Glucose-Capillary 129 (*) 70 - 99 mg/dL  HEMOGLOBIN M0N     Status: Abnormal   Collection Time   03/11/12 12:26 PM      Component Value Range   Hemoglobin A1C 6.3 (*) <5.7 %   Mean Plasma Glucose 134 (*) <117 mg/dL  GLUCOSE, CAPILLARY     Status: Abnormal   Collection Time   03/11/12  4:28 PM      Component Value Range   Glucose-Capillary 124 (*) 70 - 99 mg/dL  GLUCOSE, CAPILLARY     Status: Abnormal   Collection Time   03/11/12  4:46 PM      Component Value Range   Glucose-Capillary 124 (*) 70 - 99 mg/dL  GLUCOSE, CAPILLARY     Status: Abnormal   Collection Time   03/11/12  8:01 PM      Component Value Range   Glucose-Capillary 133 (*) 70 - 99 mg/dL  GLUCOSE, CAPILLARY     Status: Abnormal   Collection Time   03/12/12 12:11 AM      Component Value Range   Glucose-Capillary 141 (*) 70 - 99 mg/dL  GLUCOSE, CAPILLARY     Status: Abnormal   Collection Time   03/12/12  5:08 AM      Component Value Range   Glucose-Capillary 145 (*) 70 - 99 mg/dL  LIPID PANEL     Status: Abnormal   Collection Time   03/12/12  5:30 AM      Component Value Range   Cholesterol 171  0 - 200  mg/dL   Triglycerides 027 (*) <150 mg/dL   HDL 16 (*) >25 mg/dL   Total CHOL/HDL Ratio 10.7     VLDL 46 (*) 0 - 40 mg/dL   LDL Cholesterol 366 (*) 0 - 99 mg/dL  GLUCOSE, CAPILLARY     Status: Abnormal   Collection Time   03/12/12  8:43 AM      Component Value Range   Glucose-Capillary 174 (*) 70 - 99 mg/dL   Dg  Chest 2 View  03/11/2012  *RADIOLOGY REPORT*  Clinical Data: Fall, right-sided weakness.  CHEST - 2 VIEW  Comparison: None  Findings: Heart and mediastinal contours are within normal limits. No focal opacities or effusions.  No acute bony abnormality.  IMPRESSION: No active cardiopulmonary disease.  Original Report Authenticated By: Cyndie Chime, M.D.   Ct Head Wo Contrast  03/11/2012  *RADIOLOGY REPORT*  Clinical Data:  Found down, slurred speech, right facial droop, recent CVA  CT HEAD WITHOUT CONTRAST CT CERVICAL SPINE WITHOUT CONTRAST  Technique:  Multidetector CT imaging of the head and cervical spine was performed following the standard protocol without intravenous contrast.  Multiplanar CT image reconstructions of the cervical spine were also generated.  Comparison:  None.  CT HEAD  Findings: No evidence of parenchymal hemorrhage or extra-axial fluid collection. No mass lesion, mass effect, or midline shift.  Hypodensity in the left basal ganglia/periventricular white matter, compatible with reported history of subacute infarct.  Additional possible hypodensity in the cortical/subcortical left frontal lobe (series 3/image 19), equivocal.  Subcortical white matter and periventricular small vessel ischemic changes.  Intracranial atherosclerosis.  Global cortical atrophy.  No ventriculomegaly.  The visualized paranasal sinuses are essentially clear.  The mastoid air cells are unopacified.  No evidence of calvarial fracture.  IMPRESSION: Hypodensity in the left basal ganglia/periventricular white matter, compatible with reported history of subacute infarct.  Atrophy with small vessel  ischemic changes and intracranial atherosclerosis.  CT CERVICAL SPINE  Findings: Exaggerated cervical lordosis.  No evidence of fracture or dislocation.  Vertebral body heights are maintained.  The dens appears intact.  No prevertebral soft tissue swelling.  Moderate multilevel degenerative changes.  IMPRESSION: No evidence of traumatic injury to the cervical spine.  Moderate multilevel degenerative changes.  Original Report Authenticated By: Charline Bills, M.D.   Ct Cervical Spine Wo Contrast  03/11/2012  *RADIOLOGY REPORT*  Clinical Data:  Found down, slurred speech, right facial droop, recent CVA  CT HEAD WITHOUT CONTRAST CT CERVICAL SPINE WITHOUT CONTRAST  Technique:  Multidetector CT imaging of the head and cervical spine was performed following the standard protocol without intravenous contrast.  Multiplanar CT image reconstructions of the cervical spine were also generated.  Comparison:  None.  CT HEAD  Findings: No evidence of parenchymal hemorrhage or extra-axial fluid collection. No mass lesion, mass effect, or midline shift.  Hypodensity in the left basal ganglia/periventricular white matter, compatible with reported history of subacute infarct.  Additional possible hypodensity in the cortical/subcortical left frontal lobe (series 3/image 19), equivocal.  Subcortical white matter and periventricular small vessel ischemic changes.  Intracranial atherosclerosis.  Global cortical atrophy.  No ventriculomegaly.  The visualized paranasal sinuses are essentially clear.  The mastoid air cells are unopacified.  No evidence of calvarial fracture.  IMPRESSION: Hypodensity in the left basal ganglia/periventricular white matter, compatible with reported history of subacute infarct.  Atrophy with small vessel ischemic changes and intracranial atherosclerosis.  CT CERVICAL SPINE  Findings: Exaggerated cervical lordosis.  No evidence of fracture or dislocation.  Vertebral body heights are maintained.  The dens  appears intact.  No prevertebral soft tissue swelling.  Moderate multilevel degenerative changes.  IMPRESSION: No evidence of traumatic injury to the cervical spine.  Moderate multilevel degenerative changes.  Original Report Authenticated By: Charline Bills, M.D.   Mr Brain Wo Contrast  03/11/2012  *RADIOLOGY REPORT*  Clinical Data:  Right-sided weakness for several days.  History of diabetes.  MRI HEAD WITHOUT CONTRAST MRA HEAD WITHOUT CONTRAST  Technique:  Multiplanar, multiecho pulse sequences of the brain and surrounding structures were obtained without intravenous contrast. Angiographic images of the head were obtained using MRA technique without contrast.  Comparison:  03/11/2012 CT  MRI HEAD  Findings:  Moderate sized acute infarction affects the left posterior putamen and left periventricular white matter.  No mass effect or acute hemorrhage.  No mass lesion or hydrocephalus.  Mild to moderate age related atrophy. Mild to moderate white matter hypodensity affects the periventricular and subcortical white matter, likely chronic microvascular ischemic change.  No midline shift.  Patent carotid and basilar arteries.  Normal midline structures.  No sinus or mastoid disease. Bilateral cataract extraction.  IMPRESSION: Moderate sized area of acute infarction affects the left posterior basal ganglia and periventricular white matter without hemorrhage. Good agreement with prior earlier CT.  Mild to moderate atrophy and chronic microvascular ischemic change.  MRA HEAD  Findings: The internal carotid arteries are dolichoectatic but widely patent.  The basilar artery is widely patent and slightly dolichoectatic.  Left greater than right vertebral arteries supply the basilar.  There is no proximal stenosis of the anterior, middle, or posterior cerebral arteries.  No MCA branch occlusion is observed.  There is no cerebellar branch occlusion.  There is no visible intracranial aneurysm.  IMPRESSION: Unremarkable MR  angiography of the intracranial circulation.  Original Report Authenticated By: Elsie Stain, M.D.   Mr Mra Head/brain Wo Cm  03/11/2012  *RADIOLOGY REPORT*  Clinical Data:  Right-sided weakness for several days.  History of diabetes.  MRI HEAD WITHOUT CONTRAST MRA HEAD WITHOUT CONTRAST  Technique:  Multiplanar, multiecho pulse sequences of the brain and surrounding structures were obtained without intravenous contrast. Angiographic images of the head were obtained using MRA technique without contrast.  Comparison:  03/11/2012 CT  MRI HEAD  Findings:  Moderate sized acute infarction affects the left posterior putamen and left periventricular white matter.  No mass effect or acute hemorrhage.  No mass lesion or hydrocephalus.  Mild to moderate age related atrophy. Mild to moderate white matter hypodensity affects the periventricular and subcortical white matter, likely chronic microvascular ischemic change.  No midline shift.  Patent carotid and basilar arteries.  Normal midline structures.  No sinus or mastoid disease. Bilateral cataract extraction.  IMPRESSION: Moderate sized area of acute infarction affects the left posterior basal ganglia and periventricular white matter without hemorrhage. Good agreement with prior earlier CT.  Mild to moderate atrophy and chronic microvascular ischemic change.  MRA HEAD  Findings: The internal carotid arteries are dolichoectatic but widely patent.  The basilar artery is widely patent and slightly dolichoectatic.  Left greater than right vertebral arteries supply the basilar.  There is no proximal stenosis of the anterior, middle, or posterior cerebral arteries.  No MCA branch occlusion is observed.  There is no cerebellar branch occlusion.  There is no visible intracranial aneurysm.  IMPRESSION: Unremarkable MR angiography of the intracranial circulation.  Original Report Authenticated By: Elsie Stain, M.D.    Assessment/Plan: Diagnosis: Left posterior basal ganglia  and periventricular thrombotic white matter infarct 1. Does the need for close, 24 hr/day medical supervision in concert with the patient's rehab needs make it unreasonable for this patient to be served in a less intensive setting? Yes 2. Co-Morbidities requiring supervision/potential complications: Diabetes 3. Due to bladder management, bowel management, safety, skin/wound care, disease management, medication administration, pain management and patient education, does the patient require 24 hr/day rehab nursing? Yes 4. Does the patient require coordinated  care of a physician, rehab nurse, PT (1-2 hrs/day, 5 days/week), OT (1-2 hrs/day, 5 days/week) and SLP (1-2 hrs/day, 5 days/week) to address physical and functional deficits in the context of the above medical diagnosis(es)? Yes Addressing deficits in the following areas: balance, endurance, locomotion, strength, transferring, bowel/bladder control, bathing, dressing, feeding, grooming, toileting, cognition, speech, language, swallowing and psychosocial support 5. Can the patient actively participate in an intensive therapy program of at least 3 hrs of therapy per day at least 5 days per week? Yes 6. The potential for patient to make measurable gains while on inpatient rehab is excellent 7. Anticipated functional outcomes upon discharge from inpatient rehab are modified independent to min assist with PT, modified independent to minimal assistance with OT, modified independent with SLP. 8. Estimated rehab length of stay to reach the above functional goals is:  2-3 weeks 9. Does the patient have adequate social supports to accommodate these discharge functional goals? Yes 10. Anticipated D/C setting: Home 11. Anticipated post D/C treatments: HH therapy 12. Overall Rehab/Functional Prognosis: excellent  RECOMMENDATIONS: This patient's condition is appropriate for continued rehabilitative care in the following setting: CIR Patient has agreed to  participate in recommended program. Yes Note that insurance prior authorization may be required for reimbursement for recommended care.  Comment: Rehabilitation nurse to followup.   Ranelle Oyster M.D. 03/12/2012

## 2012-03-12 NOTE — Evaluation (Signed)
Occupational Therapy Evaluation Patient Details Name: James Bell MRN: [id number] DOB: [birth year] Today's Date: 03/12/2012 Time: 2130-8657 OT Time Calculation (min): 21 min  OT Assessment / Plan / Recommendation Clinical Impression  Pt admitted with left CVA and presents with right hemiplegia. Pt will benefit from skilled OT in the acute setting followed by CIR to maximize I with ADL and ADL mobility prior to d/c    OT Assessment  Patient needs continued OT Services    Follow Up Recommendations  Inpatient Rehab    Barriers to Discharge      Equipment Recommendations  Defer to next venue    Recommendations for Other Services Rehab consult  Frequency  Min 3X/week    Precautions / Restrictions Precautions Precautions: Fall Restrictions Weight Bearing Restrictions: No   Pertinent Vitals/Pain Pt denies any pain    ADL  Grooming: Maximal assistance;Simulated (with bilateral tasks; setup for unilateral tasks) Where Assessed - Grooming: Supported sitting Lower Body Dressing: Performed;Maximal assistance Where Assessed - Lower Body Dressing: Supported sitting Toilet Transfer: Simulated;+2 Total assistance Toilet Transfer: Patient Percentage: 40%    OT Diagnosis: Hemiplegia dominant side  OT Problem List: Decreased strength;Decreased activity tolerance;Decreased knowledge of use of DME or AE;Impaired balance (sitting and/or standing);Decreased coordination;Decreased range of motion;Impaired UE functional use OT Treatment Interventions: Self-care/ADL training;Neuromuscular education;DME and/or AE instruction;Therapeutic activities;Patient/family education;Balance training   OT Goals Acute Rehab OT Goals OT Goal Formulation: With patient Time For Goal Achievement: 03/26/12 Potential to Achieve Goals: Good ADL Goals Pt Will Perform Grooming: with supervision;with set-up;Sitting at sink;Sitting, chair ADL Goal: Grooming - Progress: Goal set today Pt Will Perform Upper  Body Dressing: with set-up;with modified independence;Sitting, bed;Sitting, chair ADL Goal: Upper Body Dressing - Progress: Goal set today Pt Will Perform Lower Body Dressing: with min assist;Sit to stand from chair;Sit to stand from bed ADL Goal: Lower Body Dressing - Progress: Goal set today Pt Will Transfer to Toilet: with min assist;Stand pivot transfer;with DME ADL Goal: Toilet Transfer - Progress: Goal set today Pt Will Perform Toileting - Clothing Manipulation: with min assist;Standing ADL Goal: Toileting - Clothing Manipulation - Progress: Goal set today Additional ADL Goal #1: Pt will be Mod I with all bed mobility in prep for ADLs ADL Goal: Additional Goal #1 - Progress: Goal set today Arm Goals Additional Arm Goal #1: Pt will be I with AA/ROM to RUE Arm Goal: Additional Goal #1 - Progress: Goal set today  Visit Information  Last OT Received On: 03/12/12 Assistance Needed: +2 PT/OT Co-Evaluation/Treatment: Yes    Subjective Data  Subjective: I can't move it (hand) Patient Stated Goal: Return to independent level   Prior Functioning  Home Living Lives With: Alone Available Help at Discharge: Family;Friend(s) Type of Home: House Home Access: Stairs to enter [employer] of Steps: 1 Entrance Stairs-Rails: None Home Layout: One level Bathroom Shower/Tub: Tub/shower unit;Walk-in shower Bathroom Toilet: Handicapped height Bathroom Accessibility: Yes How Accessible: Accessible via walker Home Adaptive Equipment: Grab bars in shower;Shower chair with back;Walker - standard Prior Function Level of Independence: Independent Able to Take Stairs?: Yes Vocation: Part time employment (opthamologist) Dominant Hand: Right    Cognition  Overall Cognitive Status: Difficult to assess (due to very little verbalization but seems [facility]) Difficult to assess due to: Impaired communication Arousal/Alertness: Awake/alert Orientation Level: Appears intact for tasks  assessed Behavior During Session: Flat affect    Extremity/Trunk Assessment Right Upper Extremity Assessment RUE ROM/Strength/Tone: Deficits Left Upper Extremity Assessment LUE ROM/Strength/Tone: Within functional levels LUE Sensation: WFL -  Light Touch LUE Coordination: WFL - gross/fine motor Right Lower Extremity Assessment RLE ROM/Strength/Tone: Deficits RLE ROM/Strength/Tone Deficits: 2/5 quad; 2+/5 hamstring; 2/5 hip flexors; trace DF RLE Sensation: WFL - Light Touch Left Lower Extremity Assessment LLE ROM/Strength/Tone: Within functional levels LLE Sensation: WFL - Light Touch   Mobility Bed Mobility Bed Mobility: Supine to Sit;Sitting - Scoot to Edge of Bed Supine to Sit: 1: +2 Total assist Supine to Sit: Patient Percentage: 50% Sitting - Scoot to Edge of Bed: 1: +2 Total assist Sitting - Scoot to Edge of Bed: Patient Percentage: 50% Details for Bed Mobility Assistance: (A) to elevate trunk OOB with cues for proper technique;  (A) to scoot hips EOB using pad and cues for proper weight shift Transfers Sit to Stand: 1: +2 Total assist;From bed Sit to Stand: Patient Percentage: 60% Stand to Sit: 1: +2 Total assist;To chair/3-in-1 Stand to Sit: Patient Percentage: 60% Details for Transfer Assistance: (A) to initiate transfer with max cues for technique; (A) to slowly descend to recliner without right knee buckling. (A) for proper weight shift to advance RLE with assistance and (A) to maintain balance during weight shift to left side.  Noticeable right knee buckling initially with occasional hyperextension   Exercise    Balance Balance Balance Assessed: Yes Static Sitting Balance Static Sitting - Balance Support: Feet supported Static Sitting - Level of Assistance: 3: Mod assist;4: Min assist Static Sitting - Comment/# of Minutes: ~ 5 minutes; initial mod (A) to find midline and prevent right lean then needed min (A) to maintain balance during MMT;  intermittent supervision  End  of Session OT - End of Session Equipment Utilized During Treatment: Gait belt Activity Tolerance: Patient tolerated treatment well Patient left: in chair;with call bell/phone within reach;with family/visitor present Nurse Communication: Mobility status  GO     Julita Ozbun 03/12/2012, 4:54 PM
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
## 2012-03-12 NOTE — Progress Notes (Signed)
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 He was recently seen at [facility] after waking on 03/07/12 with right sided weakness and inability to write his name. His MRI and CT head showed a posterior limb of IC CVA, dopplers showed no ICA stenosis, MRA brain showed no significant stenosis. 2 D echo was not performed that I can see. Patient was discharged Saturday 03/09/3012 on ASA (increased from 81mg  to 325mg ) and Zocor with plan to follow up with PT. Sunday night 03/10/2012 patient noted increased speech and swallowing difficulty and awoke Monday am 03/11/2012 with flaccid right arm and leg. Patient was brought to [place] where CT head shows a hypodensity in the BG consistent with patient's previous infarct. Patient was not a TPA candidate secondary to recent stroke. He was admitted for further evaluation and treatment.  SUBJECTIVE His daughter James Bell is at the bedside.  Overall he feels his condition is gradually worsening with increasing hemiparesis on the right. Patient lived alone prior to admission. Daughter reports he has a supportive family.  OBJECTIVE Most recent Vital Signs: Filed Vitals:   03/11/12 2007 03/11/12 2208 03/12/12 0151 03/12/12 0551  BP: 131/73 124/73 132/77 149/79  Pulse: 81 77 74 79  Temp: 98 F (36.7 C) 97.7 F (36.5 C) 97.9 F (36.6 C) 97.6 F (36.4 C)  TempSrc: Oral Oral Oral Oral  Resp: 18 18 18 18   Height:      Weight:      SpO2: 97% 94% 94% 94%   CBG (last 3)  Basename 03/12/12 0508 03/12/12 0011 03/11/12 2001  GLUCAP 145* 141* 133*   Intake/Output from previous day: 06/24 0701 - 06/25 0700 In: 1806.7 [P.O.:360; I.V.:1446.7] Out: 1650 [Urine:1650]  IV Fluid Intake:     . dextrose 5 % and 0.45% NaCl 80 mL/hr at 03/11/12 1230   MEDICATIONS    . sodium chloride   Intravenous Once  . sodium chloride   Intravenous  STAT  . aspirin  325 mg Oral Daily  . clopidogrel  75 mg Oral Q breakfast  . enoxaparin  40 mg Subcutaneous Q24H  . gi cocktail  30 mL Oral Once  . insulin aspart  0-15 Units Subcutaneous TID WC  . insulin glargine  10 Units Subcutaneous QHS  . pramipexole  1 mg Oral Daily  . DISCONTD: aspirin  300 mg Rectal Daily  . DISCONTD: insulin aspart  0-15 Units Subcutaneous Q4H   PRN:  acetaminophen, acetaminophen, food thickener, ondansetron (ZOFRAN) IV, senna-docusate, DISCONTD: ondansetron (ZOFRAN) IV  Diet:  Dysphagia 3 nectar thick liquids Activity:  OOB with assistance DVT Prophylaxis:  Lovenox 40 mg sq daily   CLINICALLY SIGNIFICANT STUDIES Basic Metabolic Panel:  Lab 03/11/12 1610  NA 138  K 4.6  CL 103  CO2 20  GLUCOSE 181*  BUN 42*  CREATININE 1.78*  CALCIUM 9.5  MG --  PHOS --   Liver Function Tests:  Lab 03/11/12 0625  AST 18  ALT 13  ALKPHOS 35*  BILITOT 0.3  PROT 7.4  ALBUMIN 3.8   CBC:  Lab 03/11/12 0625  WBC 9.5  NEUTROABS 6.7  HGB 13.9  HCT 40.2  MCV 87.8  PLT 168   Coagulation:  Lab 03/11/12 0625  LABPROT 13.8  INR 1.04   Cardiac Enzymes:  Lab 03/11/12 0625  CKTOTAL --  CKMB --  CKMBINDEX --  TROPONINI <0.30  Urinalysis: No results found for this basename: COLORURINE:2,APPERANCEUR:2,LABSPEC:2,PHURINE:2,GLUCOSEU:2,HGBUR:2,BILIRUBINUR:2,KETONESUR:2,PROTEINUR:2,UROBILINOGEN:2,NITRITE:2,LEUKOCYTESUR:2 in the last 168 hours Lipid Panel    Component Value Date/Time   CHOL 171 03/12/2012 0530   TRIG 231* 03/12/2012 0530   HDL 16* 03/12/2012 0530   CHOLHDL 10.7 03/12/2012 0530   VLDL 46* 03/12/2012 0530   LDLCALC 109* 03/12/2012 0530   HgbA1C  Lab Results  Component Value Date   HGBA1C 6.3* 03/11/2012    Urine Drug Screen:   No results found for this basename: labopia, cocainscrnur, labbenz, amphetmu, thcu, labbarb    Alcohol Level: No results found for this basename: ETH:2 in the last 168 hours  CT of the brain  03/11/2012   Hypodensity in  the left basal ganglia/periventricular white matter, compatible with reported history of subacute infarct.  Atrophy with small vessel ischemic changes and intracranial atherosclerosis.   CT of the Cervical Spine 03/11/2012  No evidence of traumatic injury to the cervical spine.  Moderate multilevel degenerative changes.    MRI of the brain  03/11/2012 Moderate sized area of acute infarction affects the left posterior basal ganglia and periventricular white matter without hemorrhage. Good agreement with prior earlier CT.  Mild to moderate atrophy and chronic microvascular ischemic change.   MRA of the brain  03/11/2012   Unremarkable MR angiography of the intracranial circulation. ? PCA infundibulum 2.5 mm aneurysm per MRA from [place] Fear  2D Echocardiogram  pending  Carotid Doppler  No internal carotid artery stenosis bilaterally. Vertebrals with antegrade flow bilaterally.   CXR  03/11/2012   No active cardiopulmonary disease.  EKG  Canceled here.   Therapy Recommendations PT -, OT -  Physical Exam  Pleasant middle aged Caucasian male not in distress.Awake alert. Afebrile. Head is nontraumatic. Neck is supple without bruit. Hearing is normal. Cardiac exam no murmur or gallop. Lungs are clear to auscultation. Distal pulses are well felt.   Neurological Exam : Patient alert.awake oriented x3. Follows commands well. Minimum dysarthria. Good comprehension and attention. Pupils equal  reactive to light; corneal reflexes present . Gaze deviation to  Left:but able to look to the right past midline. moderate lower facial weakness on the  right  . Marland Kitchen Follows commands,  .dense right hemiplegia  1/5. Purposeful antigravity movements on left  Intact touch sensation bilaterally.No hemineglect seen. DTRs diminished on the right  Plantars up going on the right down going on the  Left: .gait deferred.    ASSESSMENT Mr. James Bell is a 78 y.o. male with a moderate sized left posterior basal ganglia and  periventricular white matter  secondary to small vessel disease.  On aspirin 325 mg orally every day prior to admission. Now on aspirin 325 mg orally every day and clopidogrel 75 mg orally every day for secondary stroke prevention. Patient with resultant right hemiparesis, right facial droop, dysarthria, dysphagia, .  -hypercholesterolemia, LDL 109, on crestor 10 mg daily prior to admission, but not taking routinely -OSA, uses CPAP at night -diabetes, HgbA1c 6.3  Hospital day # 1  TREATMENT/PLAN -Continue clopidogrel 75 mg orally every day for secondary stroke prevention. Stop Aspirin due to increased risk of hemorrhage -f/u 2D echo -discontinue D5 as dextrose contraindicated in acute stroke -rehab consult -increase crestor to 20 mg daily (lipitor 40 in hospital) -D/W daughterand patient and answered questions.  Joaquin Music, ANP-BC, GNP-BC [facility] Pager: [phone] 03/12/2012 8:18 AM  Dr. Delia Heady, Stroke Center Medical Director, has personally reviewed chart, pertinent data, examined the patient and  developed the plan of care. Pager:  [phone]

## 2012-03-13 ENCOUNTER — Inpatient Hospital Stay (HOSPITAL_COMMUNITY)
Admission: AD | Admit: 2012-03-13 | Discharge: 2012-03-30 | DRG: 945 | Disposition: A | Discharge status: Skilled Nursing Facility | Payer: Medicare Other | Source: Ambulatory Visit | Attending: Physical Medicine & Rehabilitation | Admitting: Physical Medicine & Rehabilitation

## 2012-03-13 DIAGNOSIS — I10 Essential (primary) hypertension: Secondary | ICD-10-CM | POA: Diagnosis not present

## 2012-03-13 DIAGNOSIS — E119 Type 2 diabetes mellitus without complications: Secondary | ICD-10-CM | POA: Diagnosis not present

## 2012-03-13 DIAGNOSIS — Z87891 Personal history of nicotine dependence: Secondary | ICD-10-CM

## 2012-03-13 DIAGNOSIS — G4700 Insomnia, unspecified: Secondary | ICD-10-CM | POA: Diagnosis not present

## 2012-03-13 DIAGNOSIS — M6281 Muscle weakness (generalized): Secondary | ICD-10-CM | POA: Diagnosis not present

## 2012-03-13 DIAGNOSIS — G40909 Epilepsy, unspecified, not intractable, without status epilepticus: Secondary | ICD-10-CM | POA: Diagnosis not present

## 2012-03-13 DIAGNOSIS — G4733 Obstructive sleep apnea (adult) (pediatric): Secondary | ICD-10-CM | POA: Diagnosis present

## 2012-03-13 DIAGNOSIS — G2581 Restless legs syndrome: Secondary | ICD-10-CM | POA: Diagnosis not present

## 2012-03-13 DIAGNOSIS — E7800 Pure hypercholesterolemia, unspecified: Secondary | ICD-10-CM | POA: Diagnosis present

## 2012-03-13 DIAGNOSIS — R4789 Other speech disturbances: Secondary | ICD-10-CM | POA: Diagnosis present

## 2012-03-13 DIAGNOSIS — IMO0001 Reserved for inherently not codable concepts without codable children: Secondary | ICD-10-CM

## 2012-03-13 DIAGNOSIS — I69959 Hemiplegia and hemiparesis following unspecified cerebrovascular disease affecting unspecified side: Secondary | ICD-10-CM | POA: Diagnosis not present

## 2012-03-13 DIAGNOSIS — G8190 Hemiplegia, unspecified affecting unspecified side: Secondary | ICD-10-CM

## 2012-03-13 DIAGNOSIS — K219 Gastro-esophageal reflux disease without esophagitis: Secondary | ICD-10-CM | POA: Diagnosis present

## 2012-03-13 DIAGNOSIS — G459 Transient cerebral ischemic attack, unspecified: Secondary | ICD-10-CM | POA: Diagnosis not present

## 2012-03-13 DIAGNOSIS — E785 Hyperlipidemia, unspecified: Secondary | ICD-10-CM | POA: Diagnosis not present

## 2012-03-13 DIAGNOSIS — E1165 Type 2 diabetes mellitus with hyperglycemia: Secondary | ICD-10-CM

## 2012-03-13 DIAGNOSIS — R339 Retention of urine, unspecified: Secondary | ICD-10-CM | POA: Diagnosis not present

## 2012-03-13 DIAGNOSIS — I6330 Cerebral infarction due to thrombosis of unspecified cerebral artery: Secondary | ICD-10-CM | POA: Diagnosis not present

## 2012-03-13 DIAGNOSIS — R1310 Dysphagia, unspecified: Secondary | ICD-10-CM | POA: Diagnosis present

## 2012-03-13 DIAGNOSIS — I69928 Other speech and language deficits following unspecified cerebrovascular disease: Secondary | ICD-10-CM | POA: Diagnosis not present

## 2012-03-13 DIAGNOSIS — G8110 Spastic hemiplegia affecting unspecified side: Secondary | ICD-10-CM | POA: Diagnosis not present

## 2012-03-13 DIAGNOSIS — R279 Unspecified lack of coordination: Secondary | ICD-10-CM | POA: Diagnosis not present

## 2012-03-13 DIAGNOSIS — Z5189 Encounter for other specified aftercare: Secondary | ICD-10-CM | POA: Diagnosis not present

## 2012-03-13 DIAGNOSIS — R498 Other voice and resonance disorders: Secondary | ICD-10-CM | POA: Diagnosis not present

## 2012-03-13 DIAGNOSIS — R269 Unspecified abnormalities of gait and mobility: Secondary | ICD-10-CM | POA: Diagnosis not present

## 2012-03-13 LAB — BASIC METABOLIC PANEL
BUN: 33 mg/dL — ABNORMAL HIGH (ref 6–23)
CO2: 20 mEq/L (ref 19–32)
Calcium: 9.5 mg/dL (ref 8.4–10.5)
Chloride: 101 mEq/L (ref 96–112)
Creatinine, Ser: 1.35 mg/dL (ref 0.50–1.35)
GFR calc Af Amer: 56 mL/min — ABNORMAL LOW (ref >90)
GFR calc non Af Amer: 49 mL/min — ABNORMAL LOW (ref >90)
Glucose, Bld: 165 mg/dL — ABNORMAL HIGH (ref 70–99)
Potassium: 4.6 mEq/L (ref 3.5–5.1)
Sodium: 135 mEq/L (ref 135–145)

## 2012-03-13 LAB — GLUCOSE, CAPILLARY
Glucose-Capillary: 207 mg/dL — ABNORMAL HIGH (ref 70–99)
Glucose-Capillary: 176 mg/dL — ABNORMAL HIGH (ref 70–99)
Glucose-Capillary: 129 mg/dL — ABNORMAL HIGH (ref 70–99)
Glucose-Capillary: 172 mg/dL — ABNORMAL HIGH (ref 70–99)

## 2012-03-13 LAB — CBC
HCT: 42.3 % (ref 39.0–52.0)
Hemoglobin: 14.2 g/dL (ref 13.0–17.0)
MCH: 30 pg (ref 26.0–34.0)
MCHC: 33.6 g/dL (ref 30.0–36.0)
MCV: 89.4 fL (ref 78.0–100.0)
Platelets: 187 10*3/uL (ref 150–400)
RBC: 4.73 MIL/uL (ref 4.22–5.81)
RDW: 13.1 % (ref 11.5–15.5)
WBC: 9.1 10*3/uL (ref 4.0–10.5)

## 2012-03-13 MED ORDER — ALUM & MAG HYDROXIDE-SIMETH 200-200-20 MG/5ML PO SUSP
15.0000 mL | ORAL | Status: DC | PRN
  Administered 2012-03-24 – 2012-03-26 (×2): 15 mL via ORAL
  Filled 2012-03-13 (×3): qty 30

## 2012-03-13 MED ORDER — INSULIN GLARGINE 100 UNIT/ML ~~LOC~~ SOLN
15.0000 [IU] | Freq: Every day | SUBCUTANEOUS | Status: DC
  Administered 2012-03-13 – 2012-03-14 (×2): 15 [IU] via SUBCUTANEOUS

## 2012-03-13 MED ORDER — INSULIN GLARGINE 100 UNIT/ML ~~LOC~~ SOLN: 15.0000 [IU] | Freq: Every day | SUBCUTANEOUS | Status: DC

## 2012-03-13 MED ORDER — PANTOPRAZOLE SODIUM 40 MG PO TBEC
40.0000 mg | DELAYED_RELEASE_TABLET | Freq: Every day | ORAL | Status: DC
  Administered 2012-03-14 – 2012-03-29 (×16): 40 mg via ORAL
  Filled 2012-03-13 (×16): qty 1

## 2012-03-13 MED ORDER — INSULIN ASPART 100 UNIT/ML ~~LOC~~ SOLN
0.0000 [IU] | Freq: Three times a day (TID) | SUBCUTANEOUS | Status: DC
  Administered 2012-03-13: 2 [IU] via SUBCUTANEOUS
  Administered 2012-03-14 (×2): 3 [IU] via SUBCUTANEOUS
  Administered 2012-03-14: 2 [IU] via SUBCUTANEOUS
  Administered 2012-03-15: 3 [IU] via SUBCUTANEOUS
  Administered 2012-03-15 (×2): 8 [IU] via SUBCUTANEOUS
  Administered 2012-03-16 (×2): 3 [IU] via SUBCUTANEOUS
  Administered 2012-03-16: 5 [IU] via SUBCUTANEOUS
  Administered 2012-03-17: 3 [IU] via SUBCUTANEOUS
  Administered 2012-03-17: 8 [IU] via SUBCUTANEOUS
  Administered 2012-03-17: 5 [IU] via SUBCUTANEOUS
  Administered 2012-03-18: 3 [IU] via SUBCUTANEOUS
  Administered 2012-03-18: 5 [IU] via SUBCUTANEOUS
  Administered 2012-03-18 – 2012-03-19 (×2): 3 [IU] via SUBCUTANEOUS
  Administered 2012-03-19 – 2012-03-20 (×2): 5 [IU] via SUBCUTANEOUS
  Administered 2012-03-20: 3 [IU] via SUBCUTANEOUS
  Administered 2012-03-20: 5 [IU] via SUBCUTANEOUS
  Administered 2012-03-21: 2 [IU] via SUBCUTANEOUS
  Administered 2012-03-21 – 2012-03-22 (×4): 3 [IU] via SUBCUTANEOUS
  Administered 2012-03-22 – 2012-03-23 (×2): 2 [IU] via SUBCUTANEOUS
  Administered 2012-03-23: 3 [IU] via SUBCUTANEOUS
  Administered 2012-03-23: 5 [IU] via SUBCUTANEOUS
  Administered 2012-03-24: 2 [IU] via SUBCUTANEOUS
  Administered 2012-03-24: 3 [IU] via SUBCUTANEOUS
  Administered 2012-03-24: 2 [IU] via SUBCUTANEOUS
  Administered 2012-03-25: 3 [IU] via SUBCUTANEOUS
  Administered 2012-03-25: 2 [IU] via SUBCUTANEOUS
  Administered 2012-03-27 – 2012-03-28 (×2): 3 [IU] via SUBCUTANEOUS
  Administered 2012-03-29 (×2): 2 [IU] via SUBCUTANEOUS

## 2012-03-13 MED ORDER — ENOXAPARIN SODIUM 40 MG/0.4ML ~~LOC~~ SOLN
40.0000 mg | SUBCUTANEOUS | Status: DC
  Administered 2012-03-14 – 2012-03-29 (×16): 40 mg via SUBCUTANEOUS
  Filled 2012-03-13 (×17): qty 0.4

## 2012-03-13 MED ORDER — ONDANSETRON HCL 4 MG/2ML IJ SOLN: 4.0000 mg | Freq: Four times a day (QID) | INTRAMUSCULAR | Status: DC | PRN

## 2012-03-13 MED ORDER — PRAMIPEXOLE DIHYDROCHLORIDE 1 MG PO TABS
1.0000 mg | ORAL_TABLET | Freq: Every day | ORAL | Status: DC
  Administered 2012-03-13 – 2012-03-29 (×17): 1 mg via ORAL
  Filled 2012-03-13 (×19): qty 1

## 2012-03-13 MED ORDER — LISINOPRIL 10 MG PO TABS
10.0000 mg | ORAL_TABLET | Freq: Every day | ORAL | Status: DC
  Administered 2012-03-13: 10 mg via ORAL
  Filled 2012-03-13: qty 1

## 2012-03-13 MED ORDER — SORBITOL 70 % SOLN
30.0000 mL | Freq: Every day | Status: DC | PRN
  Administered 2012-03-20: 30 mL via ORAL
  Filled 2012-03-13 (×3): qty 30

## 2012-03-13 MED ORDER — ACETAMINOPHEN 325 MG PO TABS
325.0000 mg | ORAL_TABLET | ORAL | Status: DC | PRN
  Administered 2012-03-13 – 2012-03-21 (×4): 650 mg via ORAL
  Filled 2012-03-13 (×4): qty 2

## 2012-03-13 MED ORDER — HYDROCODONE-ACETAMINOPHEN 5-325 MG PO TABS
1.0000 | ORAL_TABLET | Freq: Once | ORAL | Status: DC
  Filled 2012-03-13: qty 1

## 2012-03-13 MED ORDER — ATORVASTATIN CALCIUM 40 MG PO TABS
40.0000 mg | ORAL_TABLET | Freq: Every day | ORAL | Status: DC
  Administered 2012-03-13 – 2012-03-29 (×17): 40 mg via ORAL
  Filled 2012-03-13 (×18): qty 1

## 2012-03-13 MED ORDER — LISINOPRIL 10 MG PO TABS
10.0000 mg | ORAL_TABLET | Freq: Every day | ORAL | Status: DC
  Administered 2012-03-14 – 2012-03-30 (×17): 10 mg via ORAL
  Filled 2012-03-13 (×19): qty 1

## 2012-03-13 MED ORDER — DULOXETINE HCL 60 MG PO CPEP
60.0000 mg | ORAL_CAPSULE | Freq: Every day | ORAL | Status: DC
  Administered 2012-03-14 – 2012-03-30 (×17): 60 mg via ORAL
  Filled 2012-03-13 (×20): qty 1

## 2012-03-13 MED ORDER — ONDANSETRON HCL 4 MG PO TABS
4.0000 mg | ORAL_TABLET | Freq: Four times a day (QID) | ORAL | Status: DC | PRN
  Administered 2012-03-20: 4 mg via ORAL
  Filled 2012-03-13: qty 1

## 2012-03-13 MED ORDER — CLOPIDOGREL BISULFATE 75 MG PO TABS
75.0000 mg | ORAL_TABLET | Freq: Every day | ORAL | Status: DC
  Administered 2012-03-14 – 2012-03-30 (×17): 75 mg via ORAL
  Filled 2012-03-13 (×19): qty 1

## 2012-03-13 MED ORDER — POLYETHYLENE GLYCOL 3350 17 G PO PACK
17.0000 g | PACK | Freq: Every day | ORAL | Status: DC | PRN
  Administered 2012-03-18 – 2012-03-20 (×2): 17 g via ORAL
  Filled 2012-03-13 (×2): qty 1

## 2012-03-13 NOTE — Progress Notes (Signed)
Inpatient Diabetes Program Recommendations  AACE/ADA: New Consensus Statement on Inpatient Glycemic Control (2009)  Target Ranges:  Prepandial:   less than 140 mg/dL      Peak postprandial:   less than 180 mg/dL (1-2 hours)      Critically ill patients:  140 - 180 mg/dL    Results for [name], [name] (MRN [id number]) as of 03/13/2012 13:11  Ref. Range 03/13/2012 07:32 03/13/2012 11:40  Glucose-Capillary Latest Range: 70-99 mg/dL 829 (H) 562 (H)    Patient home DM regimen includes: Lantus 44 units QHS Janumet 50/1000 1 tablet bid  Inpatient Diabetes Program Recommendations Insulin - Basal: Fasting sugars elevated slightly today- May want to increase Lantus to 15 units QHS.  Note: Will follow. Ambrose Finland RN, MSN, CDE Diabetes Coordinator Inpatient Diabetes Program [phone]

## 2012-03-13 NOTE — H&P (Signed)
Physical Medicine and Rehabilitation Admission H&P      Chief Complaint   Patient presents with   .  Fall    : HPI: James Bell is a 78 y.o. right-handed male with history of diabetes mellitus and hyperlipidemia. 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 MRI revealed moderate size acute infarct affecting the left posterior putamen and left periventricular white matter. MRA of the head was unremarkable. Carotid Dopplers with no ICA stenosis. Echocardiogram with ejection fraction of 65% and grade 1 diastolic dysfunction. Neurology consulted placed on Plavix therapy. Patient was not a TPA candidate. Subcutaneous Lovenox added for DVT prophylaxis. Placed on a dysphagia 3 nectar thick liquid diet per speech therapy. Physical therapy ongoing with recommendations for physical medicine rehabilitation consult to consider inpatient rehabilitation services. Patient was felt to be due to candidate for inpatient rehabilitation services and was admitted for comprehensive rehabilitation program   Review of Systems   Musculoskeletal: Positive for back pain.   Neurological: Positive for weakness.   Restless legs   All other systems reviewed and are negative      Past Medical History   Diagnosis  Date   .  Hypercholesteremia     .  Restless leg syndrome     .  Sleep apnea     .  Type II diabetes mellitus     .  H/O hiatal hernia     .  GERD (gastroesophageal reflux disease)     .  Headache  03/11/12       "up until ~ 2 yr ago I had them daily"   .  Stroke  03/07/12       slurred speech; right hemplegia   .  Basal cell carcinoma of nose  2002       "bridge of nose"   .  History of kidney problems  12/01/10       "lacerated kidney"    Past Surgical History   Procedure  Date   .  Prostate surgery  2010       Laser   .  Tonsillectomy and adenoidectomy  ~ 1945   .  Hemorrhoid surgery  1991   .  Cataract extraction w/ intraocular lens  implant  11//2012 - 08/2011   .  Cardiac catheterization  2004       "normal"   .  Inguinal hernia repair  1981; 2002       left; "repaired left"   .  Skin cancer excision  2002       bridge of nose    History reviewed. No pertinent family history. Social History: reports that he quit smoking about 35 years ago. His smoking use included Pipe. He has never used smokeless tobacco. He reports that he drinks about 2.4 ounces of alcohol per week. He reports that he does not use illicit drugs. Allergies: No Known Allergies Medications Prior to Admission   Medication  Sig  Dispense  Refill   .  aspirin 325 MG EC tablet  Take 325 mg by mouth daily.         .  DULoxetine (CYMBALTA) 60 MG capsule  Take 60 mg by mouth daily.         .  insulin glargine (LANTUS) 100 UNIT/ML injection  Inject 44 Units into the skin at bedtime.         Marland Kitchen  lisinopril (PRINIVIL,ZESTRIL) 10 MG tablet  Take 10 mg by mouth daily.         Marland Kitchen  pramipexole (MIRAPEX) 1 MG tablet  Take 1 mg by mouth daily.         .  rosuvastatin (CRESTOR) 5 MG tablet  Take 5 mg by mouth daily.         .  sitaGLIPtan-metformin (JANUMET) 50-1000 MG per tablet  Take 1 tablet by mouth 2 (two) times daily with a meal.            Home: Home Living Lives With: Alone Available Help at Discharge: Family;Friend(s) Type of Home: House Home Access: Stairs to enter [employer] of Steps: 1 Entrance Stairs-Rails: None Home Layout: One level Bathroom Shower/Tub: Tub/shower unit;Walk-in shower Bathroom Toilet: Handicapped height Bathroom Accessibility: Yes How Accessible: Accessible via walker Home Adaptive Equipment: Grab bars in shower;Shower chair with back;Walker - standard    Functional History: Prior Function Able to Take Stairs?: Yes Vocation: Part time employment (opthamologist)   Functional Status:   Mobility: Bed Mobility Bed Mobility: Supine to Sit;Sitting - Scoot to Edge of Bed Supine to Sit: 1: +2 Total assist Supine  to Sit: Patient Percentage: 50% Sitting - Scoot to Edge of Bed: 1: +2 Total assist Sitting - Scoot to Edge of Bed: Patient Percentage: 50% Transfers Transfers: Sit to Stand;Stand to Sit Sit to Stand: 1: +2 Total assist;From bed Sit to Stand: Patient Percentage: 60% Stand to Sit: 1: +2 Total assist;To chair/3-in-1 Stand to Sit: Patient Percentage: 60% Stand Pivot Transfers: 1: +2 Total assist Stand Pivot Transfers: Patient Percentage: 40%   ADL: ADL Grooming: Maximal assistance;Simulated (with bilateral tasks; setup for unilateral tasks) Where Assessed - Grooming: Supported sitting Lower Body Dressing: Performed;Maximal assistance Where Assessed - Lower Body Dressing: Supported sitting Toilet Transfer: Simulated;+2 Total assistance   Cognition: Cognition Overall Cognitive Status: Impaired Arousal/Alertness: Awake/alert Orientation Level: Oriented X4 Attention: Sustained;Selective Sustained Attention: Appears intact Selective Attention: Impaired Selective Attention Impairment: Verbal complex Memory: Appears intact Awareness: Impaired Awareness Impairment: Emergent impairment Problem Solving: Appears intact Safety/Judgment: Appears intact (Further skilled observation is warranted) Cognition Overall Cognitive Status: Difficult to assess (due to very little verbalization but seems [facility]) Difficult to assess due to: Impaired communication Arousal/Alertness: Awake/alert Orientation Level: Appears intact for tasks assessed Behavior During Session: Flat affect     Blood pressure 147/81, pulse 75, temperature 97.4 F (36.3 C), temperature source Oral, resp. rate 16, height 5\' 11"  (1.803 m), weight 81.647 kg (180 lb), SpO2 98.00%. Physical Exam  Constitutional: He is oriented to person, place, and time. He appears well-developed and well-nourished.   HENT:   Head: Normocephalic and atraumatic.  Right Ear: External ear normal.  Left Ear: External ear normal.   Nose: Nose  normal.   Mouth/Throat: Oropharynx is clear and moist.  Dentition good Eyes: Conjunctivae and EOM are normal. Pupils are equal, round, and reactive to light. Right eye exhibits no discharge. Left eye exhibits no discharge.   Neck: Neck supple. No thyromegaly present.   Cardiovascular: Normal rate and regular rhythm.  No murmurs rubs or gallops the Pulmonary/Chest: Effort normal and breath sounds normal. No respiratory distress.   Abdominal: He exhibits no distension. There is no tenderness. L. sounds are positive  Neurological: He is alert and oriented to person, place, and time. Visual fields are intact. Mildly dysarthric speech but intelligible. He follows basic commands. Right central 7 and tongue deviation. No other cranial nerve findings are seen. No sensory loss on the right. Right upper extremity grossly trace at the deltoid and bicep. Absent at the triceps, wrist and hand intrinsics. Right  lower extremity grossly 2/5 proximal with hip flexion knee extension and knee flexion. Ankle dorsiflexion is trace to absent. Ankle plantar flexion is 2-2+. Toes are upgoing. Reflexes 1+. Cognitively he has good insight and awareness.   Skin: Skin is warm and dry.  Psychiatric: He has a normal mood and affect. His behavior is normal. Judgment and thought content normal.  Patient does display some decreased insight into his deficits       Results for orders placed during the hospital encounter of 03/11/12 (from the past 48 hour(s))   GLUCOSE, CAPILLARY     Status: Abnormal     Collection Time     03/11/12  9:47 AM       Component  Value  Range  Comment     Glucose-Capillary  148 (*)  70 - 99 mg/dL     GLUCOSE, CAPILLARY     Status: Abnormal     Collection Time     03/11/12 11:41 AM       Component  Value  Range  Comment     Glucose-Capillary  129 (*)  70 - 99 mg/dL     HEMOGLOBIN Z6X     Status: Abnormal     Collection Time     03/11/12 12:26 PM       Component  Value  Range  Comment      Hemoglobin A1C  6.3 (*)  <5.7 %       Mean Plasma Glucose  134 (*)  <117 mg/dL     GLUCOSE, CAPILLARY     Status: Abnormal     Collection Time     03/11/12  4:28 PM       Component  Value  Range  Comment     Glucose-Capillary  124 (*)  70 - 99 mg/dL     GLUCOSE, CAPILLARY     Status: Abnormal     Collection Time     03/11/12  4:46 PM       Component  Value  Range  Comment     Glucose-Capillary  124 (*)  70 - 99 mg/dL     GLUCOSE, CAPILLARY     Status: Abnormal     Collection Time     03/11/12  8:01 PM       Component  Value  Range  Comment     Glucose-Capillary  133 (*)  70 - 99 mg/dL     GLUCOSE, CAPILLARY     Status: Abnormal     Collection Time     03/12/12 12:11 AM       Component  Value  Range  Comment     Glucose-Capillary  141 (*)  70 - 99 mg/dL     GLUCOSE, CAPILLARY     Status: Abnormal     Collection Time     03/12/12  5:08 AM       Component  Value  Range  Comment     Glucose-Capillary  145 (*)  70 - 99 mg/dL     LIPID PANEL     Status: Abnormal     Collection Time     03/12/12  5:30 AM       Component  Value  Range  Comment     Cholesterol  171   0 - 200 mg/dL       Triglycerides  096 (*)  <150 mg/dL       HDL  16 (*)  >04 mg/dL  Total CHOL/HDL Ratio  10.7          VLDL  46 (*)  0 - 40 mg/dL       LDL Cholesterol  109 (*)  0 - 99 mg/dL     GLUCOSE, CAPILLARY     Status: Abnormal     Collection Time     03/12/12  8:43 AM       Component  Value  Range  Comment     Glucose-Capillary  174 (*)  70 - 99 mg/dL     GLUCOSE, CAPILLARY     Status: Abnormal     Collection Time     03/12/12 11:44 AM       Component  Value  Range  Comment     Glucose-Capillary  142 (*)  70 - 99 mg/dL     BASIC METABOLIC PANEL     Status: Abnormal     Collection Time     03/12/12  2:12 PM       Component  Value  Range  Comment     Sodium  135   135 - 145 mEq/L       Potassium  4.1   3.5 - 5.1 mEq/L       Chloride  100   96 - 112 mEq/L       CO2  22   19 - 32 mEq/L       Glucose, Bld   148 (*)  70 - 99 mg/dL       BUN  31 (*)  6 - 23 mg/dL       Creatinine, Ser  1.33   0.50 - 1.35 mg/dL       Calcium  9.4   8.4 - 10.5 mg/dL       GFR calc non Af Amer  50 (*)  >90 mL/min       GFR calc Af Amer  57 (*)  >90 mL/min     GLUCOSE, CAPILLARY     Status: Abnormal     Collection Time     03/12/12  4:29 PM       Component  Value  Range  Comment     Glucose-Capillary  129 (*)  70 - 99 mg/dL     GLUCOSE, CAPILLARY     Status: Abnormal     Collection Time     03/12/12  9:25 PM       Component  Value  Range  Comment     Glucose-Capillary  158 (*)  70 - 99 mg/dL      Dg Chest 2 View   03/11/2012  *RADIOLOGY REPORT*  Clinical Data: Fall, right-sided weakness.  CHEST - 2 VIEW  Comparison: None  Findings: Heart and mediastinal contours are within normal limits. No focal opacities or effusions.  No acute bony abnormality.  IMPRESSION: No active cardiopulmonary disease.  Original Report Authenticated By: Cyndie Chime, M.D.    Ct Head Wo Contrast   03/11/2012  *RADIOLOGY REPORT*  Clinical Data:  Found down, slurred speech, right facial droop, recent CVA  CT HEAD WITHOUT CONTRAST CT CERVICAL SPINE WITHOUT CONTRAST  Technique:  Multidetector CT imaging of the head and cervical spine was performed following the standard protocol without intravenous contrast.  Multiplanar CT image reconstructions of the cervical spine were also generated.  Comparison:  None.  CT HEAD  Findings: No evidence of parenchymal hemorrhage or extra-axial fluid collection. No mass lesion, mass effect, or midline shift.  Hypodensity in the left basal ganglia/periventricular white matter, compatible with reported history of subacute infarct.  Additional possible hypodensity in the cortical/subcortical left frontal lobe (series 3/image 19), equivocal.  Subcortical white matter and periventricular small vessel ischemic changes.  Intracranial atherosclerosis.  Global cortical atrophy.  No ventriculomegaly.  The visualized  paranasal sinuses are essentially clear.  The mastoid air cells are unopacified.  No evidence of calvarial fracture.  IMPRESSION: Hypodensity in the left basal ganglia/periventricular white matter, compatible with reported history of subacute infarct.  Atrophy with small vessel ischemic changes and intracranial atherosclerosis.  CT CERVICAL SPINE  Findings: Exaggerated cervical lordosis.  No evidence of fracture or dislocation.  Vertebral body heights are maintained.  The dens appears intact.  No prevertebral soft tissue swelling.  Moderate multilevel degenerative changes.  IMPRESSION: No evidence of traumatic injury to the cervical spine.  Moderate multilevel degenerative changes.  Original Report Authenticated By: Charline Bills, M.D.    Ct Cervical Spine Wo Contrast   03/11/2012  *RADIOLOGY REPORT*  Clinical Data:  Found down, slurred speech, right facial droop, recent CVA  CT HEAD WITHOUT CONTRAST CT CERVICAL SPINE WITHOUT CONTRAST  Technique:  Multidetector CT imaging of the head and cervical spine was performed following the standard protocol without intravenous contrast.  Multiplanar CT image reconstructions of the cervical spine were also generated.  Comparison:  None.  CT HEAD  Findings: No evidence of parenchymal hemorrhage or extra-axial fluid collection. No mass lesion, mass effect, or midline shift.  Hypodensity in the left basal ganglia/periventricular white matter, compatible with reported history of subacute infarct.  Additional possible hypodensity in the cortical/subcortical left frontal lobe (series 3/image 19), equivocal.  Subcortical white matter and periventricular small vessel ischemic changes.  Intracranial atherosclerosis.  Global cortical atrophy.  No ventriculomegaly.  The visualized paranasal sinuses are essentially clear.  The mastoid air cells are unopacified.  No evidence of calvarial fracture.  IMPRESSION: Hypodensity in the left basal ganglia/periventricular white matter,  compatible with reported history of subacute infarct.  Atrophy with small vessel ischemic changes and intracranial atherosclerosis.  CT CERVICAL SPINE  Findings: Exaggerated cervical lordosis.  No evidence of fracture or dislocation.  Vertebral body heights are maintained.  The dens appears intact.  No prevertebral soft tissue swelling.  Moderate multilevel degenerative changes.  IMPRESSION: No evidence of traumatic injury to the cervical spine.  Moderate multilevel degenerative changes.  Original Report Authenticated By: Charline Bills, M.D.    Mr Brain Wo Contrast   03/11/2012  *RADIOLOGY REPORT*  Clinical Data:  Right-sided weakness for several days.  History of diabetes.  MRI HEAD WITHOUT CONTRAST MRA HEAD WITHOUT CONTRAST  Technique:  Multiplanar, multiecho pulse sequences of the brain and surrounding structures were obtained without intravenous contrast. Angiographic images of the head were obtained using MRA technique without contrast.  Comparison:  03/11/2012 CT  MRI HEAD  Findings:  Moderate sized acute infarction affects the left posterior putamen and left periventricular white matter.  No mass effect or acute hemorrhage.  No mass lesion or hydrocephalus.  Mild to moderate age related atrophy. Mild to moderate white matter hypodensity affects the periventricular and subcortical white matter, likely chronic microvascular ischemic change.  No midline shift.  Patent carotid and basilar arteries.  Normal midline structures.  No sinus or mastoid disease. Bilateral cataract extraction.  IMPRESSION: Moderate sized area of acute infarction affects the left posterior basal ganglia and periventricular white matter without hemorrhage. Good agreement with prior earlier CT.  Mild to moderate atrophy and  chronic microvascular ischemic change.  MRA HEAD  Findings: The internal carotid arteries are dolichoectatic but widely patent.  The basilar artery is widely patent and slightly dolichoectatic.  Left greater than  right vertebral arteries supply the basilar.  There is no proximal stenosis of the anterior, middle, or posterior cerebral arteries.  No MCA branch occlusion is observed.  There is no cerebellar branch occlusion.  There is no visible intracranial aneurysm.  IMPRESSION: Unremarkable MR angiography of the intracranial circulation.  Original Report Authenticated By: Elsie Stain, M.D.    Dg Swallowing Func-no Report   03/12/2012  CLINICAL DATA: objectively assess swallow function   FLUOROSCOPY FOR SWALLOWING FUNCTION STUDY:  Fluoroscopy was provided for swallowing function study, which was  administered by a speech pathologist.  Final results and recommendations  from this study are contained within the speech pathology report.      Mr Maxine Glenn Head/brain Wo Cm   03/11/2012  *RADIOLOGY REPORT*  Clinical Data:  Right-sided weakness for several days.  History of diabetes.  MRI HEAD WITHOUT CONTRAST MRA HEAD WITHOUT CONTRAST  Technique:  Multiplanar, multiecho pulse sequences of the brain and surrounding structures were obtained without intravenous contrast. Angiographic images of the head were obtained using MRA technique without contrast.  Comparison:  03/11/2012 CT  MRI HEAD  Findings:  Moderate sized acute infarction affects the left posterior putamen and left periventricular white matter.  No mass effect or acute hemorrhage.  No mass lesion or hydrocephalus.  Mild to moderate age related atrophy. Mild to moderate white matter hypodensity affects the periventricular and subcortical white matter, likely chronic microvascular ischemic change.  No midline shift.  Patent carotid and basilar arteries.  Normal midline structures.  No sinus or mastoid disease. Bilateral cataract extraction.  IMPRESSION: Moderate sized area of acute infarction affects the left posterior basal ganglia and periventricular white matter without hemorrhage. Good agreement with prior earlier CT.  Mild to moderate atrophy and chronic  microvascular ischemic change.  MRA HEAD  Findings: The internal carotid arteries are dolichoectatic but widely patent.  The basilar artery is widely patent and slightly dolichoectatic.  Left greater than right vertebral arteries supply the basilar.  There is no proximal stenosis of the anterior, middle, or posterior cerebral arteries.  No MCA branch occlusion is observed.  There is no cerebellar branch occlusion.  There is no visible intracranial aneurysm.  IMPRESSION: Unremarkable MR angiography of the intracranial circulation.  Original Report Authenticated By: Elsie Stain, M.D.     Post Admission Physician Evaluation: Functional deficits secondary  to Left posterior basal ganglia and periventricular thrombotic white matter infarct. Patient is admitted to receive collaborative, interdisciplinary care between the physiatrist, rehab nursing staff, and therapy team. Patient's level of medical complexity and substantial therapy needs in context of that medical necessity cannot be provided at a lesser intensity of care such as a SNF. Patient has experienced substantial functional loss from his/her baseline which was documented above under the "Functional History" and "Functional Status" headings.  Judging by the patient's diagnosis, physical exam, and functional history, the patient has potential for functional progress which will result in measurable gains while on inpatient rehab.  These gains will be of substantial and practical use upon discharge  in facilitating mobility and self-care at the household level. Physiatrist will provide 24 hour management of medical needs as well as oversight of the therapy plan/treatment and provide guidance as appropriate regarding the interaction of the two. 24 hour rehab nursing will assist with  bladder management, bowel management, safety, skin/wound care, disease management, medication administration, pain management and patient education  and help integrate therapy  concepts, techniques,education, etc. PT will assess and treat for:  Functional mobility, pain management, neuromuscular reeducation, adaptive equipment, safety.  Goals are: Supervision to minimal assistance. OT will assess and treat for: Upper extremity strength, range of motion, safety, ADLs, neuromuscular reeducation, functional mobility.   Goals are: Modified independent with simple hygiene to moderate assistance. SLP will assess and treat for: Speech intelligibility and communication as well as swallowing.  Goals are: Modified independent. Case Management and Social Worker will assess and treat for psychological issues and discharge planning. Team conference will be held weekly to assess progress toward goals and to determine barriers to discharge. Patient will receive at least 3 hours of therapy per day at least 5 days per week. ELOS: 3 weeks      Prognosis:  excellent     Medical Problem List and Plan: 1. Left posterior basal ganglia and periventricular thrombotic white matter infarct 2. DVT Prophylaxis/Anticoagulation: Subcutaneous Lovenox. Monitor platelet counts and any signs of bleeding 3. Dysphagia. Mechanical soft with nectar liquids. Monitor for any signs of aspiration. Speech therapy to followup 4. Neuropsych: This patient is capable of making decisions on his/her own behalf. 5. Diabetes mellitus. Hemoglobin A1c 6.3. Lantus insulin 15 units each bedtime. Patient on 45 units Lantus prior to admission as well as janumet 50-100 twice a day. Check blood sugars a.c. and at bedtime 6. Hypertension. Lisinopril 10 mg daily. Monitor with increased mobility 7. Hyperlipidemia. Lipitor 8. Restless leg syndrome. Continue Mirapex 1 mg daily 9. Obstructive sleep apnea. CPAP. Patient has been using a CPAP machine for approximately 2 years. He is using his face mask from home.     03/13/2012, Sherrilee Gilles.D.

## 2012-03-13 NOTE — Plan of Care (Addendum)
Overall Plan of Care [facility]) Patient Details Name: James Bell MRN: [id number] DOB: 1931/07/24  Diagnosis:   Rehab for L putamen infarct R hemiparesis  Primary Diagnosis:    CVA (cerebral vascular accident) Co-morbidities: Hypercholesteremia  . Restless leg syndrome  . Sleep apnea  . Type II diabetes mellitus  . H/O hiatal hernia  . GERD (gastroesophageal reflux disease)  . Headache 03/11/12  "up until ~ 2 yr ago I had them daily"  . Stroke 03/07/12  slurred speech; right hemplegia  . Basal cell carcinoma of nose 2002  "bridge of nose"  . History of kidney problems 12/01/10  "lacerated kidney   Functional Problem List  Patient demonstrates impairments in the following areas: Balance, Endurance, Perception, Safety and Sensory Dysphagia, Communication  Basic ADL's: eating, grooming, bathing, dressing and toileting Advanced ADL's:   Transfers:  toilet and tub/shower Locomotion:  ambulation, wheelchair mobility and stairs  Additional Impairments:  Functional use of upper extremity  Anticipated Outcomes Item Anticipated Outcome  Eating/Swallowing  supervision  Basic self-care  Min assist  Tolieting  Mod assist  Bowel/Bladder    Transfers  Min assist shower. Supervision toilet. Supervision basic w/c to bed  Locomotion  Gait 50' min@, w/c 150' mod I  Communication  Supervision  Cognition  Supervision  Pain  <3  Safety/Judgment  Minimal assist  Other     Therapy Plan: PT Frequency: 2-3 X/day, 60-90 minutes OT Frequency: 1-2 X/day, 60-90 minutes SLP Frequency: 1-2 X/day, 30-60 minutes;5 out of 7 days   xTeam Interventions: Item RN PT OT SLP SW TR Other  Self Care/Advanced ADL Retraining   x      Neuromuscular Re-Education  x x      Therapeutic Activities  x x   x   UE/LE Strength Training/ROM  x x   x   UE/LE Coordination Activities  x x   x   Visual/Perceptual Remediation/Compensation         DME/Adaptive Equipment Instruction  x x   x     Therapeutic Exercise  x x   x   Balance/Vestibular Training  x x   x   Patient/Family Education x x x x  x   Cognitive Remediation/Compensation  x x x  x   Functional Mobility Training  x x   x   Ambulation/Gait Training  x       Stair Training  x       Wheelchair Propulsion/Positioning  x       Functional Statistician  x       Community Reintegration  x    x   Dysphagia/Aspiration Printmaker    x     Speech/Language Facilitation    x     Bladder Management         Bowel Management         Disease Management/Prevention x        Pain Management         Medication Management x        Skin Care/Wound Management         Splinting/Orthotics   x      Discharge Planning  x x  x x   Psychosocial Support     x x                      Team Discharge Planning: Destination:  Home Projected Follow-up:  PT, OT and SLP Outpatient Projected Equipment Needs:  Bedside Commode, Biomedical engineer involved in discharge planning:  Yes  MD ELOS: 2-3 wks Medical Rehab Prognosis:  Good Assessment: 78 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

## 2012-03-13 NOTE — Progress Notes (Signed)
Pt admitted to rehab 1420. Daughter at bedside. Pt was given information booklet and reviewed safety plan videos. Reviewed rehab process and answered all questions by daughter and pt. Call bell at bedside.

## 2012-03-13 NOTE — Progress Notes (Signed)
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 He was recently seen at [facility] after waking on 03/07/12 with right sided weakness and inability to write his name. His MRI and CT head showed a posterior limb of IC CVA, dopplers showed no ICA stenosis, MRA brain showed no significant stenosis. 2 D echo was not performed that I can see. Patient was discharged Saturday 03/09/3012 on ASA (increased from 81mg  to 325mg ) and Zocor with plan to follow up with PT. Sunday night 03/10/2012 patient noted increased speech and swallowing difficulty and awoke Monday am 03/11/2012 with flaccid right arm and leg. Patient was brought to  where CT head shows a hypodensity in the BG consistent with patient's previous infarct. Patient was not a TPA candidate secondary to recent stroke. He was admitted for further evaluation and treatment.  SUBJECTIVE Male family member at bedside. Plans for transfer to rehab today. They both feel his leg is better today. Patient's residence is in [place], so will plan follow up with DR. Veto Bell.  OBJECTIVE Most recent Vital Signs: Filed Vitals:   03/12/12 2147 03/12/12 2210 03/13/12 0238 03/13/12 0602  BP:  149/85 171/91 147/81  Pulse: 93 87 77 75  Temp:  98.1 F (36.7 C) 97.8 F (36.6 C) 97.4 F (36.3 C)  TempSrc:  Oral Oral Oral  Resp: 18 18 18 16   Height:      Weight:      SpO2: 93% 97% 98% 98%   CBG (last 3)  Basename 03/13/12 0732 03/12/12 2125 03/12/12 1629  GLUCAP 207* 158* 129*   Intake/Output from previous day: 06/25 0701 - 06/26 0700 In: 120 [P.O.:120] Out: 1851 [Urine:1850; Stool:1]  IV Fluid Intake:     . DISCONTD: dextrose 5 % and 0.45% NaCl 80 mL/hr at 03/11/12 1230   MEDICATIONS    . sodium chloride   Intravenous STAT  . atorvastatin  40 mg Oral q1800  . clopidogrel  75 mg Oral Q breakfast  . DULoxetine   60 mg Oral Daily  . enoxaparin  40 mg Subcutaneous Q24H  . HYDROcodone-acetaminophen  1 tablet Oral Once  . insulin aspart  0-15 Units Subcutaneous TID WC  . insulin glargine  15 Units Subcutaneous QHS  . lisinopril  10 mg Oral Daily  . pantoprazole  40 mg Oral Q1200  . pramipexole  1 mg Oral Daily  . DISCONTD: aspirin  325 mg Oral Daily  . DISCONTD: insulin glargine  10 Units Subcutaneous QHS   PRN:  acetaminophen, acetaminophen, alum & mag hydroxide-simeth, food thickener, senna-docusate  Diet:  Dysphagia 3 nectar thick liquids Activity:  OOB with assistance DVT Prophylaxis:  Lovenox 40 mg sq daily   CLINICALLY SIGNIFICANT STUDIES Basic Metabolic Panel:  Lab 03/13/12 4098 03/12/12 1412  NA 135 135  K 4.6 4.1  CL 101 100  CO2 20 22  GLUCOSE 165* 148*  BUN 33* 31*  CREATININE 1.35 1.33  CALCIUM 9.5 9.4  MG -- --  PHOS -- --   Liver Function Tests:  Lab 03/11/12 0625  AST 18  ALT 13  ALKPHOS 35*  BILITOT 0.3  PROT 7.4  ALBUMIN 3.8   CBC:  Lab 03/13/12 0500 03/11/12 0625  WBC 9.1 9.5  NEUTROABS -- 6.7  HGB 14.2 13.9  HCT 42.3 40.2  MCV 89.4 87.8  PLT 187 168   Coagulation:  Lab 03/11/12 0625  LABPROT 13.8  INR 1.04   Cardiac Enzymes:  Lab 03/11/12 0625  CKTOTAL --  CKMB --  CKMBINDEX --  TROPONINI <0.30   Lipid Panel    Component Value Date/Time   CHOL 171 03/12/2012 0530   TRIG 231* 03/12/2012 0530   HDL 16* 03/12/2012 0530   CHOLHDL 10.7 03/12/2012 0530   VLDL 46* 03/12/2012 0530   LDLCALC 109* 03/12/2012 0530   HgbA1C  Lab Results  Component Value Date   HGBA1C 6.3* 03/11/2012   CT of the brain  03/11/2012   Hypodensity in the left basal ganglia/periventricular white matter, compatible with reported history of subacute infarct.  Atrophy with small vessel ischemic changes and intracranial atherosclerosis.   CT of the Cervical Spine 03/11/2012  No evidence of traumatic injury to the cervical spine.  Moderate multilevel degenerative changes.    MRI  of the brain  03/11/2012 Moderate sized area of acute infarction affects the left posterior basal ganglia and periventricular white matter without hemorrhage. Good agreement with prior earlier CT.  Mild to moderate atrophy and chronic microvascular ischemic change.   MRA of the brain  03/11/2012   Unremarkable MR angiography of the intracranial circulation. ? PCA infundibulum 2.5 mm aneurysm per MRA from [facility]  2D Echocardiogram  EF 60-65%. No source of embolus.  Carotid Doppler  No internal carotid artery stenosis bilaterally. Vertebrals with antegrade flow bilaterally.   CXR  03/11/2012   No active cardiopulmonary disease.  EKG  Canceled here.   EEG normal  Therapy Recommendations PT -,CIR OT -CIR  Physical Exam  Pleasant middle aged Caucasian male not in distress.Awake alert. Afebrile. Head is nontraumatic. Neck is supple without bruit. Hearing is normal. Cardiac exam no murmur or gallop. Lungs are clear to auscultation. Distal pulses are well felt.   Neurological Exam  Patient alert.awake oriented x3. Follows commands well. Minimum dysarthria. Good comprehension and attention. Pupils equal  reactive to light; corneal reflexes present. Gaze deviation to lefft, but able to look to the right past midline. Moderate lower facial weakness on the  right. Follows commands. Dense right hemiplegia  1/5. Purposeful antigravity movements on left  Intact touch sensationthe right.  Plantars up going on the right down going on the  bilaterally. No hemineglect seen. DTRs diminished on left. Gait deferred.   ASSESSMENT James Bell is a 78 y.o. male with a moderate sized left posterior basal ganglia and periventricular white matter  secondary to small vessel disease. Neuro worsening since discharge from [facility] secondary to expected progression given disease course. On aspirin 325 mg orally every day prior to admission. Now on clopidogrel 75 mg orally every day for secondary stroke prevention.  Patient with resultant right hemiparesis, right facial droop, dysarthria, dysphagia. Needs IP rehab.  -hypercholesterolemia, LDL 109, now on crestor 20 mg (lipitor 40mg ) -OSA, uses CPAP at night -diabetes, HgbA1c 6.3. Glucoses elevated.  Hospital day # 2  TREATMENT/PLAN -Continue clopidogrel 75 mg orally every day for secondary stroke prevention.  -transfer to rehab today -Stroke Service will sign off. Follow up with Dr. Pearlean Brownie in 2 months. -D/W patient and family Dr Pearlean Brownie discussed diagnoses and plan with patient  Joaquin Music, ANP-BC, GNP-BC [facility] Pager: [phone] 03/13/2012 10:15 AM  Dr. Delia Heady, Stroke Center Medical Director, has personally reviewed chart, pertinent data, examined the patient and developed the plan of care. Pager:  [phone]

## 2012-03-13 NOTE — Discharge Summary (Signed)
Physician Discharge Summary  Patient ID: James Bell MRN: [id number] DOB/AGE: 03-09-1931 78 y.o.  Admit date: 03/11/2012 Discharge date: 03/13/2012  Primary Care Physician:  Talmage Coin, MD  Discharge Diagnoses:    . acute CVA (cerebrovascular accident) .Right hemiparesis .DM type 2 (diabetes mellitus, type 2) .OSA (obstructive sleep apnea) .Elevated serum creatinine   Consults:  Neurology                    Inpatient rehabilitation   Discharge Medications: Medication List  As of 03/13/2012  9:57 AM   STOP taking these medications         aspirin 325 MG EC tablet         TAKE these medications         DULoxetine 60 MG capsule   Commonly known as: CYMBALTA   Take 60 mg by mouth daily.      insulin glargine 100 UNIT/ML injection   Commonly known as: LANTUS   Inject 44 Units into the skin at bedtime.      lisinopril 10 MG tablet   Commonly known as: PRINIVIL,ZESTRIL   Take 10 mg by mouth daily.      pramipexole 1 MG tablet   Commonly known as: MIRAPEX   Take 1 mg by mouth daily.      rosuvastatin 5 MG tablet   Commonly known as: CRESTOR   Take 5 mg by mouth daily.      sitaGLIPtan-metformin 50-1000 MG per tablet   Commonly known as: JANUMET   Take 1 tablet by mouth 2 (two) times daily with a meal.            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 He was in his usual state of health last week when he was in [facility] and developed weakness of his right side. Patient went to the local hospital. Over there he was was diagnosed as having  acute stroke involving the posterior limb of the internal capsule on the left. Patient's symptoms started improving and he was discharged home. He was discharged on Saturday, but on Sunday he started feeling weaker and worse. When he woke up on the morning of admission on 03/12/2012, he had impaired speech. He could not move his right arm or right leg. And, so, he decided to come to the hospital. He denied any vision changes. Denied any seizure activity. He had mild headache in the front of his head .he ruled out off the bed and fell on the floor. He did have significantly slurred speech.    Hospital Course:  Patient is a 78 year old male who was recently diagnosed last week with acute CVA presented with right hemiparesis and was admitted for further workup.  Acute cerebrovascular accident with right hemiparesis  Patient was admitted to the neuro floor. He was initially placed on aspirin and Plavix daily. Neurology was consulted and recommended to stop aspirin and continue Plavix. He underwent MRI/MRA of the brain which showed moderate size area of acute infarction affecting the left posterior basal ganglia and periventricular white matter without hemorrhage.  MRA angiography was unremarkable. Patient had carotid Dopplers done in the [facility] which according to the patient's report was normal. 2-D echocardiogram was done which showed EF of 60-65% with normal wall motion, grade 1 diastolic dysfunction and no LV thrombus. PT eval was done and patient was deemed as good candidate for inpatient rehabilitation. Patient was accepted for rehabilitation by CIR. Patient should have a followup appointment with Dr. Pearlean Brownie of stroke service in 2 months after discharge from the inpatient rehabilitation  Patient has a history of sleep apnea and was continued on CPAP. EEG was checked to rule out any seizures and was negative.  Type 2 diabetes: Continue Lantus and sliding scale insulin.  Day of Discharge BP  147/81  Pulse 75  Temp 97.4 F (36.3 C) (Oral)  Resp 16  Ht 5\' 11"  (1.803 m)  Wt 81.647 kg (180 lb)  BMI 25.10 kg/m2  SpO2 98%  Physical Exam: General: Alert and awake oriented x3 not in any acute distress. HEENT: anicteric sclera, pupils reactive to light and accommodation CVS: S1-S2 clear no murmur rubs or gallops Chest: clear to auscultation bilaterally, no wheezing rales or rhonchi Abdomen: soft nontender, nondistended, normal bowel sounds, no organomegaly Extremities: no cyanosis, clubbing or edema noted bilaterally Neuro: Right upper extremity flaccid, Right lower extremity 2-3/5  The results of significant diagnostics from this hospitalization (including imaging, microbiology, ancillary and laboratory) are listed below for reference.    LAB RESULTS: Basic Metabolic Panel:  Lab 03/13/12 1610 03/12/12 1412  NA 135 135  K 4.6 4.1  CL 101 100  CO2 20 22  GLUCOSE 165* 148*  BUN 33* 31*  CREATININE 1.35 1.33  CALCIUM 9.5 9.4  MG -- --  PHOS -- --   Liver Function Tests:  Lab 03/11/12 0625  AST 18  ALT 13  ALKPHOS 35*  BILITOT 0.3  PROT 7.4  ALBUMIN 3.8   CBC:  Lab 03/13/12 0500 03/11/12 0625  WBC 9.1 9.5  NEUTROABS -- 6.7  HGB 14.2 13.9  HCT 42.3 40.2  MCV 89.4 --  PLT 187 168   Cardiac Enzymes:  Lab 03/11/12 0625  CKTOTAL --  CKMB --  CKMBINDEX --  TROPONINI <0.30   BNP: No components found with this basename: POCBNP:2 CBG:  Lab 03/13/12 0732 03/12/12 2125  GLUCAP 207* 158*    Significant Diagnostic Studies:  Dg Chest 2 View  03/11/2012  *RADIOLOGY REPORT*  Clinical Data: Fall, right-sided weakness.  CHEST - 2 VIEW  Comparison: None  Findings: Heart and mediastinal contours are within normal limits. No focal opacities or effusions.  No acute bony abnormality.  IMPRESSION: No active cardiopulmonary disease.  Original Report Authenticated By: Cyndie Chime, M.D.   Ct Head Wo Contrast  03/11/2012  *RADIOLOGY REPORT*  Clinical Data:  Found  down, slurred speech, right facial droop, recent CVA  CT HEAD WITHOUT CONTRAST CT CERVICAL SPINE WITHOUT CONTRAST  Technique:  Multidetector CT imaging of the head and cervical spine was performed following the standard protocol without intravenous contrast.  Multiplanar CT image reconstructions of the cervical spine were also generated.  Comparison:  None.  CT HEAD  Findings: No evidence of parenchymal hemorrhage or extra-axial fluid collection. No mass lesion, mass effect, or midline shift.  Hypodensity in the left basal ganglia/periventricular white matter, compatible with reported history of subacute infarct.  Additional possible hypodensity in the cortical/subcortical left frontal lobe (series 3/image 19), equivocal.  Subcortical white matter and periventricular small vessel ischemic changes.  Intracranial atherosclerosis.  Global cortical atrophy.  No ventriculomegaly.  The visualized paranasal sinuses are essentially clear.  The mastoid air cells are unopacified.  No evidence of calvarial fracture.  IMPRESSION: Hypodensity in the left basal ganglia/periventricular white matter, compatible with reported history of subacute infarct.  Atrophy with small vessel ischemic changes and intracranial atherosclerosis.  CT CERVICAL SPINE  Findings: Exaggerated cervical lordosis.  No evidence of fracture or dislocation.  Vertebral body heights are maintained.  The dens appears intact.  No prevertebral soft tissue swelling.  Moderate multilevel degenerative changes.  IMPRESSION: No evidence of traumatic injury to the cervical spine.  Moderate multilevel degenerative changes.  Original Report Authenticated By: Charline Bills, M.D.   Ct Cervical Spine Wo Contrast  03/11/2012  *RADIOLOGY REPORT*  Clinical Data:  Found down, slurred speech, right facial droop, recent CVA  CT HEAD WITHOUT CONTRAST CT CERVICAL SPINE WITHOUT CONTRAST  Technique:  Multidetector CT imaging of the head and cervical spine was performed following  the standard protocol without intravenous contrast.  Multiplanar CT image reconstructions of the cervical spine were also generated.  Comparison:  None.  CT HEAD  Findings: No evidence of parenchymal hemorrhage or extra-axial fluid collection. No mass lesion, mass effect, or midline shift.  Hypodensity in the left basal ganglia/periventricular white matter, compatible with reported history of subacute infarct.  Additional possible hypodensity in the cortical/subcortical left frontal lobe (series 3/image 19), equivocal.  Subcortical white matter and periventricular small vessel ischemic changes.  Intracranial atherosclerosis.  Global cortical atrophy.  No ventriculomegaly.  The visualized paranasal sinuses are essentially clear.  The mastoid air cells are unopacified.  No evidence of calvarial fracture.  IMPRESSION: Hypodensity in the left basal ganglia/periventricular white matter, compatible with reported history of subacute infarct.  Atrophy with small vessel ischemic changes and intracranial atherosclerosis.  CT CERVICAL SPINE  Findings: Exaggerated cervical lordosis.  No evidence of fracture or dislocation.  Vertebral body heights are maintained.  The dens appears intact.  No prevertebral soft tissue swelling.  Moderate multilevel degenerative changes.  IMPRESSION: No evidence of traumatic injury to the cervical spine.  Moderate multilevel degenerative changes.  Original Report Authenticated By: Charline Bills, M.D.   Mr Brain Wo Contrast  03/11/2012  *RADIOLOGY REPORT*  Clinical Data:  Right-sided weakness for several days.  History of diabetes.  MRI HEAD WITHOUT CONTRAST MRA HEAD WITHOUT CONTRAST  Technique:  Multiplanar, multiecho pulse sequences of the brain and surrounding structures were obtained without intravenous contrast. Angiographic images of the head were obtained using MRA technique without contrast.  Comparison:  03/11/2012 CT  MRI HEAD  Findings:  Moderate sized acute infarction affects the  left posterior putamen and left periventricular white matter.  No mass effect or acute hemorrhage.  No mass lesion or hydrocephalus.  Mild to moderate age related atrophy. Mild to moderate white matter hypodensity affects the periventricular and subcortical white matter, likely chronic microvascular ischemic change.  No midline shift.  Patent carotid and basilar arteries.  Normal midline structures.  No sinus or mastoid disease. Bilateral cataract extraction.  IMPRESSION: Moderate sized area of acute infarction affects the left posterior basal ganglia and periventricular white matter without hemorrhage. Good agreement with prior earlier CT.  Mild to moderate atrophy and chronic microvascular ischemic change.  MRA HEAD  Findings: The internal carotid arteries are dolichoectatic but widely patent.  The basilar artery is widely patent and slightly dolichoectatic.  Left greater than right vertebral arteries supply the basilar.  There is no proximal stenosis  of the anterior, middle, or posterior cerebral arteries.  No MCA branch occlusion is observed.  There is no cerebellar branch occlusion.  There is no visible intracranial aneurysm.  IMPRESSION: Unremarkable MR angiography of the intracranial circulation.  Original Report Authenticated By: Elsie Stain, M.D.   Mr Mra Head/brain Wo Cm  03/11/2012  *RADIOLOGY REPORT*  Clinical Data:  Right-sided weakness for several days.  History of diabetes.  MRI HEAD WITHOUT CONTRAST MRA HEAD WITHOUT CONTRAST  Technique:  Multiplanar, multiecho pulse sequences of the brain and surrounding structures were obtained without intravenous contrast. Angiographic images of the head were obtained using MRA technique without contrast.  Comparison:  03/11/2012 CT  MRI HEAD  Findings:  Moderate sized acute infarction affects the left posterior putamen and left periventricular white matter.  No mass effect or acute hemorrhage.  No mass lesion or hydrocephalus.  Mild to moderate age related  atrophy. Mild to moderate white matter hypodensity affects the periventricular and subcortical white matter, likely chronic microvascular ischemic change.  No midline shift.  Patent carotid and basilar arteries.  Normal midline structures.  No sinus or mastoid disease. Bilateral cataract extraction.  IMPRESSION: Moderate sized area of acute infarction affects the left posterior basal ganglia and periventricular white matter without hemorrhage. Good agreement with prior earlier CT.  Mild to moderate atrophy and chronic microvascular ischemic change.  MRA HEAD  Findings: The internal carotid arteries are dolichoectatic but widely patent.  The basilar artery is widely patent and slightly dolichoectatic.  Left greater than right vertebral arteries supply the basilar.  There is no proximal stenosis of the anterior, middle, or posterior cerebral arteries.  No MCA branch occlusion is observed.  There is no cerebellar branch occlusion.  There is no visible intracranial aneurysm.  IMPRESSION: Unremarkable MR angiography of the intracranial circulation.  Original Report Authenticated By: Elsie Stain, M.D.     DISPOSITION: Inpatient rehabilitation  DIET: Carb modified diet  DISCHARGE FOLLOW-UP - Patient will need followup with Dr. Pearlean Brownie, stroke service in 2 months and his PCP Dr. Talmage Coin for medical management in 7-10 days after the discharge   DISCHARGE FOLLOW-UP Follow-up Information    Follow up with SETHI,PRAMODKUMAR P, MD. Schedule an appointment as soon as possible for a visit in 2 months.   Contact information:   [address] [facility] [place] [address] [phone]             Time spent on Discharge: 45 minutes  Signed:   Shauntea Lok M.D. [facility] 03/13/2012, 9:57 AM Pager: [phone]  If 7PM-7AM, please contact night-coverage [email or web address] Password [email or web address]

## 2012-03-13 NOTE — Progress Notes (Signed)
Approximately 2000 patient was having dull aching pain on his Rt arm. Gave tylenol and it didn't help. Notified on call hospitalist and received an order for Vicodin and that helped the patient.

## 2012-03-13 NOTE — Progress Notes (Signed)
Speech Language Pathology Dysphagia Treatment Patient Details Name: James Bell MRN: [id number] DOB: Jun 26, 1931 Today's Date: 03/13/2012 Time: 1017-1029 SLP Time Calculation (min): 12 min  Assessment / Plan / Recommendation Clinical Impression  Pt. seen for dysphagia treatment sitting in recliner with friend present.  SLP reviewed/educated yesterday's MBS results with pt. and friend with need and rationale for thickened liquids.  He consumed nectar thick juice with hard and immediate cough after 4th sip likely due to taking a much larger sip.  Pt. masticated graham cracker texture without difficutly and checked right side for pocketed food.  Plans are to transfer to CIR with continued ST for dysphagia and language.     Diet Recommendation  Continue with Current Diet: Dysphagia 3 (mechanical soft);Nectar-thick liquid    SLP Plan Continue with current plan of care      Swallowing Goals  SLP Swallowing Goals Patient will consume recommended diet without observed clinical signs of aspiration with: Minimal assistance Swallow Study Goal #1 - Progress: Progressing toward goal Patient will utilize recommended strategies during swallow to increase swallowing safety with: Minimal assistance Swallow Study Goal #2 - Progress: Progressing toward goal  General Temperature Spikes Noted: No Respiratory Status: Room air Behavior/Cognition: Alert;Cooperative Oral Cavity - Dentition: Adequate natural dentition Patient Positioning: Upright in chair  Oral Cavity - Oral Hygiene Brush patient's teeth BID with toothbrush (using toothpaste with fluoride): Yes   Dysphagia Treatment Treatment focused on: Skilled observation of diet tolerance;Patient/family/caregiver education;Utilization of compensatory strategies Treatment Methods/Modalities: Skilled observation Patient observed directly with PO's: Yes Type of PO's observed: Regular;Nectar-thick liquids Feeding: Able to feed self Liquids provided  via: Cup Pharyngeal Phase Signs & Symptoms: Immediate cough Type of cueing: Verbal Amount of cueing: Minimal        Royce Macadamia M.Ed [employer] [phone]  03/13/2012

## 2012-03-13 NOTE — Progress Notes (Signed)
Physical Therapy Treatment Patient Details Name: James Bell MRN: [id number] DOB: November 20, 1930 Today's Date: 03/13/2012 Time: 4098-1191 PT Time Calculation (min): 16 min  PT Assessment / Plan / Recommendation Comments on Treatment Session  Patient had just gotten bath and pivoted to chair with nursing. Agreed to some standing balance. Anticipate admission to CIR today    Follow Up Recommendations  Inpatient Rehab    Barriers to Discharge        Equipment Recommendations  Defer to next venue    Recommendations for Other Services Rehab consult  Frequency Min 4X/week   Plan Discharge plan remains appropriate    Precautions / Restrictions Precautions Precautions: Fall Precaution Comments: dysphagia Restrictions Weight Bearing Restrictions: No   Pertinent Vitals/Pain     Mobility  Bed Mobility Bed Mobility: Not assessed Transfers Sit to Stand: 1: +2 Total assist;From chair/3-in-1;With armrests;With upper extremity assist Sit to Stand: Patient Percentage: 50% Stand to Sit: To chair/3-in-1;1: +2 Total assist;With armrests;With upper extremity assist Stand to Sit: Patient Percentage: 50% Details for Transfer Assistance: A to initiate stand. Patient able to support most of his weight through L side. Cues for shifting weight forward and positioning of LEs prior to standing. A to block R LE. A to control descent into recliner.     Exercises     PT Diagnosis:    PT Problem List:   PT Treatment Interventions:     PT Goals Acute Rehab PT Goals PT Goal: Sit to Stand - Progress: Progressing toward goal PT Goal: Stand to Sit - Progress: Progressing toward goal  Visit Information  Last PT Received On: 03/13/12 Assistance Needed: +2    Subjective Data      Cognition  Overall Cognitive Status: Appears within functional limits for tasks assessed/performed Arousal/Alertness: Awake/alert Orientation Level: Appears intact for tasks assessed Behavior During Session: [facility] for  tasks performed    Balance  Static Standing Balance Static Standing - Balance Support: Bilateral upper extremity supported Static Standing - Level of Assistance: 1: +2 Total assist Static Standing - Comment/# of Minutes: A required for weight shifting and blocking of R LE with shifting. Cues for uprigt posture as patient tends to lean anteriorly to the left.   End of Session PT - End of Session Equipment Utilized During Treatment: Gait belt Activity Tolerance: Patient tolerated treatment well;Patient limited by fatigue Patient left: in chair Nurse Communication: Mobility status   GP     Fredrich Birks 03/13/2012, 12:58 PM 03/13/2012 Fredrich Birks PTA [phone] pager [phone] office

## 2012-03-13 NOTE — Progress Notes (Signed)
From CIR standpoint patient appears ready for CIR today. Discussed with Dr. Isidoro Donning. Thanks for consult. Patient very motivated. Toni Amend adm coordinator [phone].

## 2012-03-13 NOTE — Progress Notes (Signed)
Patient place himself on his home cpap unit. Patient is tolerating well at this time. RT will continue to monitor.

## 2012-03-13 NOTE — PMR Pre-admission (Signed)
PMR Admission Coordinator Pre-Admission Assessment  Patient: James Bell is an 78 y.o., male MRN: [id number] DOB: [birth year] Height: 5\' 11"  (180.3 cm) Weight: 81.647 kg (180 lb)  Insurance Information HMO:     PPO:      PCP:      IPA:      80/20:      OTHER: x PRIMARY: Medicare      Policy#: [id number]      Subscriber: patient CM Name:       Phone#:      Fax#:  Pre-Cert#:       Employer: Self employed Benefits:  Phone #:      Name: Armed forces training and education officer. Date: 06/18/98 A and B     Deduct: $1184      Out of Pocket Max:       Life Max:  CIR: 100%      SNF: LBD=12/03/10; full SNF Outpatient: 80%     Co-Pay: 20% Home Health: 100%      Co-Pay: 0% DME: 80%     Co-Pay: 20% Providers: patient's choice SECONDARY: Tricare for Life      Policy#: [id number]      Subscriber: patient CM Name:       Phone#:      Fax#:  Pre-Cert#:       Employer:  Benefits:  Phone #:      Name:  Eff. Date:     Deduct:       Out of Pocket Max:       Life Max:  CIR:       SNF:  Outpatient:      Co-Pay:  Home Health:       Co-Pay:  DME:      Co-Pay:   Medicaid Application Date:       Case Manager:  Disability Application Date:       Case Worker:   Emergency Contact Information Contact Information    Name Relation Home Work Mobile   [place] [phone]   Tyrone Schimke Daughter [phone]     Current Medical History  Patient Admitting Diagnosis: Left posterior basal ganglia and periventricular thrombotic white matter infarct  History of Present Illness: James Bell is a 78 y.o. right-handed male with history of diabetes mellitus and hyperlipidemia. 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 MRI revealed moderate size acute infarct affecting the left posterior putamen and left periventricular white matter. MRA of the head was unremarkable. Carotid Dopplers with no ICA stenosis.  Echocardiogram with ejection fraction of 65% and grade 1 diastolic dysfunction. Neurology consulted placed on Plavix therapy. Patient was not a TPA candidate. Subcutaneous Lovenox added for DVT prophylaxis. Placed on a dysphagia 3 nectar thick liquid diet per speech therapy. Physical therapy ongoing with recommendations for physical medicine rehabilitation consult to consider inpatient rehabilitation services. Patient was felt to be due to candidate for inpatient rehabilitation services and was admitted for comprehensive rehabilitation program  Total: 9     Past Medical History  Past Medical History  Diagnosis Date  . Hypercholesteremia   . Restless leg syndrome   . Sleep apnea   . Type II diabetes mellitus   . H/O hiatal hernia   . GERD (gastroesophageal reflux disease)   . Headache 03/11/12    "up until ~ 2 yr ago I had them daily"  . Stroke 03/07/12    slurred speech; right hemplegia  . Basal cell carcinoma of nose 2002    "  bridge of nose"  . History of kidney problems 12/01/10    "lacerated kidney"    Family History  family history is not on file.  Prior Rehab/Hospitalizations: No prior CIR stays   Current Medications  Current facility-administered medications:0.9 %  sodium chloride infusion, , Intravenous, STAT, [employer], PA-C, Last Rate: 75 mL/hr at 03/11/12 1204;  acetaminophen (TYLENOL) suppository 650 mg, 650 mg, Rectal, Q4H PRN, Osvaldo Shipper, MD;  acetaminophen (TYLENOL) tablet 650 mg, 650 mg, Oral, Q4H PRN, Osvaldo Shipper, MD, 650 mg at 03/12/12 2010 alum & mag hydroxide-simeth (MAALOX/MYLANTA) 200-200-20 MG/5ML suspension 15 mL, 15 mL, Oral, Q4H PRN, Osvaldo Shipper, MD;  atorvastatin (LIPITOR) tablet 40 mg, 40 mg, Oral, q1800, Layne Benton, NP;  clopidogrel (PLAVIX) tablet 75 mg, 75 mg, Oral, Q breakfast, Osvaldo Shipper, MD, 75 mg at 03/13/12 0754;  DULoxetine (CYMBALTA) DR capsule 60 mg, 60 mg, Oral, Daily, Osvaldo Shipper, MD, 60 mg at 03/12/12  1150 enoxaparin (LOVENOX) injection 40 mg, 40 mg, Subcutaneous, Q24H, Osvaldo Shipper, MD, 40 mg at 03/12/12 1150;  food thickener (RESOURCE THICKENUP CLEAR) powder, , Oral, PRN, Osvaldo Shipper, MD;  HYDROcodone-acetaminophen (NORCO) 5-325 MG per tablet 1 tablet, 1 tablet, Oral, Once, Leanne Chang, NP, 1 tablet at 03/12/12 2213 insulin aspart (novoLOG) injection 0-15 Units, 0-15 Units, Subcutaneous, TID WC, Osvaldo Shipper, MD, 5 Units at 03/13/12 0755;  insulin glargine (LANTUS) injection 15 Units, 15 Units, Subcutaneous, QHS, Ripudeep K Rai, MD;  lisinopril (PRINIVIL,ZESTRIL) tablet 10 mg, 10 mg, Oral, Daily, Ripudeep K Rai, MD;  pantoprazole (PROTONIX) EC tablet 40 mg, 40 mg, Oral, Q1200, Osvaldo Shipper, MD, 40 mg at 03/12/12 1150 pramipexole (MIRAPEX) tablet 1 mg, 1 mg, Oral, Daily, Osvaldo Shipper, MD, 1 mg at 03/12/12 1655;  senna-docusate (Senokot-S) tablet 1 tablet, 1 tablet, Oral, QHS PRN, Osvaldo Shipper, MD, 1 tablet at 03/12/12 2213;  DISCONTD: insulin glargine (LANTUS) injection 10 Units, 10 Units, Subcutaneous, QHS, Osvaldo Shipper, MD, 10 Units at 03/12/12 2126  Patients Current Diet: Dysphagia  Precautions / Restrictions Precautions Precautions: Fall Precautions/Special Needs: Swallowing Precaution Comments: dysphagia Restrictions Weight Bearing Restrictions: No   Prior Activity Level Community (5-7x/wk): Independent PTA Home Assistive Devices / Equipment Home Assistive Devices/Equipment: CBG Meter;CPAP;Other (Comment) ("have hearing aides at home") Home Adaptive Equipment: Grab bars around toilet;Grab bars in shower;Shower chair with back  Prior Functional Level Prior Function Level of Independence: Independent Able to Take Stairs?: Yes Driving: Yes Vocation: Part time employment Comments: works as Engineer, maintenance (IT)  Arousal/Alertness: Awake/alert Overall Cognitive Status: Impaired Overall Cognitive Status: Difficult to assess (due to  very little verbalization but seems [name]) Difficult to assess due to: Impaired communication Orientation Level: Oriented X4 Attention: Sustained;Selective Sustained Attention: Appears intact Selective Attention: Impaired Selective Attention Impairment: Verbal complex Memory: Appears intact Awareness: Impaired Awareness Impairment: Emergent impairment Problem Solving: Appears intact Safety/Judgment: Appears intact (Further skilled observation is warranted)    Extremity Assessment (includes Sensation/Coordination)  RUE ROM/Strength/Tone: Deficits  RLE ROM/Strength/Tone: Deficits RLE ROM/Strength/Tone Deficits: 2/5 quad; 2+/5 hamstring; 2/5 hip flexors; trace DF RLE Sensation: WFL - Light Touch    ADLs  Grooming: Maximal assistance;Simulated (with bilateral tasks; setup for unilateral tasks) Where Assessed - Grooming: Supported sitting Lower Body Dressing: Performed;Maximal assistance Where Assessed - Lower Body Dressing: Supported sitting Toilet Transfer: Simulated;+2 Total assistance Toilet Transfer: Patient Percentage: 40%    Mobility  Bed Mobility: Supine to Sit;Sitting - Scoot to Edge of Bed Supine to Sit: 1: +2 Total assist Supine  to Sit: Patient Percentage: 50% Sitting - Scoot to Edge of Bed: 1: +2 Total assist Sitting - Scoot to Edge of Bed: Patient Percentage: 50%    Transfers  Transfers: Sit to Stand;Stand to Sit Sit to Stand: 1: +2 Total assist;From bed Sit to Stand: Patient Percentage: 60% Stand to Sit: 1: +2 Total assist;To chair/3-in-1 Stand to Sit: Patient Percentage: 60% Stand Pivot Transfers: 1: +2 Total assist Stand Pivot Transfers: Patient Percentage: 40%    Ambulation / Gait / Stairs / Producer, television/film/video Sitting - Balance Support: Feet supported Static Sitting - Level of Assistance: 3: Mod assist;4: Min assist Static Sitting - Comment/# of Minutes: ~ 5 minutes; initial mod (A) to find midline and  prevent right lean then needed min (A) to maintain balance during MMT;  intermittent supervision     Previous Home Environment Living Arrangements: Alone Lives With: Alone Available Help at Discharge: Family;Friend(s) Type of Home:  (Condo) Home Layout: One level Home Access: Stairs to enter Entrance Stairs-Rails: None Entrance Stairs-Number of Steps: 1 Bathroom Shower/Tub: Health visitor: Handicapped height Bathroom Accessibility: Yes How Accessible: Accessible via walker Home Care Services: No  Discharge Living Setting Plans for Discharge Living Setting: Patient's home;Alone (condo) Type of Home at Discharge:  (Condo) Discharge Home Layout: One level Discharge Home Access: Stairs to enter Entrance Stairs-Rails: None Entrance Stairs-Number of Steps: 1 step up Discharge Bathroom Shower/Tub: Walk-in shower Discharge Bathroom Toilet: Handicapped height Discharge Bathroom Accessibility: Yes How Accessible: Accessible via walker Do you have any problems obtaining your medications?: No  Social/Family/Support Systems Patient Roles: Partner;Parent (employee) Contact Information: [phone] Anticipated Caregiver: Family(dtr)- Lurena Joiner; friend [name] Apple Anticipated Caregiver's Contact Information: [phone]; friend [phone] Ability/Limitations of Caregiver: family can provide assistance Caregiver Availability: 24/7 Discharge Plan Discussed with Primary Caregiver: Yes Is Caregiver In Agreement with Plan?: Yes Does Caregiver/Family have Issues with Lodging/Transportation while Pt is in Rehab?: No  Goals/Additional Needs Patient/Family Goal for Rehab: Mod I-Min A PT; Mod I-Min A OT; Mod I SLP Expected length of stay: 2-3 weeks Cultural Considerations: none Dietary Needs: Dyspaghia 3, nectar thick Equipment Needs: to be determined Pt/Family Agrees to Admission and willing to participate: Yes Program Orientation Provided & Reviewed with Pt/Caregiver  Including Roles  & Responsibilities: Yes  Patient Condition: This patient's condition remains as documented in the Consult dated 03/12/12 @1644 , in which the Rehabilitation Physician determined and documented that the patient's condition is appropriate for intensive rehabilitative care in an inpatient rehabilitation facility.  Preadmission Screen Completed By:  Oletta Darter, 03/13/2012 11:13 AM ______________________________________________________________________   Discussed status with Dr. Riley Kill on 03/13/12 at 11:00 and received telephone approval for admission today.  Admission Coordinator:  Oletta Darter, time11:18am/Date6/26/13

## 2012-03-14 DIAGNOSIS — I6330 Cerebral infarction due to thrombosis of unspecified cerebral artery: Secondary | ICD-10-CM

## 2012-03-14 DIAGNOSIS — Z5189 Encounter for other specified aftercare: Secondary | ICD-10-CM

## 2012-03-14 LAB — COMPREHENSIVE METABOLIC PANEL
ALT: 15 U/L (ref 0–53)
AST: 18 U/L (ref 0–37)
Albumin: 3.7 g/dL (ref 3.5–5.2)
Alkaline Phosphatase: 40 U/L (ref 39–117)
BUN: 33 mg/dL — ABNORMAL HIGH (ref 6–23)
CO2: 21 mEq/L (ref 19–32)
Calcium: 9.6 mg/dL (ref 8.4–10.5)
Chloride: 101 mEq/L (ref 96–112)
Creatinine, Ser: 1.21 mg/dL (ref 0.50–1.35)
GFR calc Af Amer: 64 mL/min — ABNORMAL LOW (ref >90)
GFR calc non Af Amer: 56 mL/min — ABNORMAL LOW (ref >90)
Glucose, Bld: 167 mg/dL — ABNORMAL HIGH (ref 70–99)
Potassium: 4.2 mEq/L (ref 3.5–5.1)
Sodium: 136 mEq/L (ref 135–145)
Total Bilirubin: 0.5 mg/dL (ref 0.3–1.2)
Total Protein: 7.7 g/dL (ref 6.0–8.3)

## 2012-03-14 LAB — GLUCOSE, CAPILLARY
Glucose-Capillary: 152 mg/dL — ABNORMAL HIGH (ref 70–99)
Glucose-Capillary: 193 mg/dL — ABNORMAL HIGH (ref 70–99)
Glucose-Capillary: 196 mg/dL — ABNORMAL HIGH (ref 70–99)
Glucose-Capillary: 269 mg/dL — ABNORMAL HIGH (ref 70–99)

## 2012-03-14 LAB — CBC
HCT: 42.3 % (ref 39.0–52.0)
Hemoglobin: 14.5 g/dL (ref 13.0–17.0)
MCH: 30.3 pg (ref 26.0–34.0)
MCHC: 34.3 g/dL (ref 30.0–36.0)
MCV: 88.5 fL (ref 78.0–100.0)
Platelets: 189 10*3/uL (ref 150–400)
RBC: 4.78 MIL/uL (ref 4.22–5.81)
RDW: 13.1 % (ref 11.5–15.5)
WBC: 8.8 10*3/uL (ref 4.0–10.5)

## 2012-03-14 LAB — DIFFERENTIAL
Basophils Absolute: 0 10*3/uL (ref 0.0–0.1)
Basophils Relative: 0 % (ref 0–1)
Eosinophils Absolute: 0.2 10*3/uL (ref 0.0–0.7)
Eosinophils Relative: 2 % (ref 0–5)
Lymphocytes Relative: 28 % (ref 12–46)
Lymphs Abs: 2.5 10*3/uL (ref 0.7–4.0)
Monocytes Absolute: 1 10*3/uL (ref 0.1–1.0)
Monocytes Relative: 11 % (ref 3–12)
Neutro Abs: 5.1 10*3/uL (ref 1.7–7.7)
Neutrophils Relative %: 58 % (ref 43–77)

## 2012-03-14 NOTE — Progress Notes (Signed)
Patient information reviewed and entered into [facility] system by Rosaisela Jamroz, RN, CRRN, PPS Coordinator.  Information including medical coding and functional independence measure will be reviewed and updated through discharge.     Per nursing patient was given "Data Collection Information Summary for Patients in Inpatient Rehabilitation Facilities with attached "Privacy Act Statement-Health Care Records" upon admission.   

## 2012-03-14 NOTE — ED Provider Notes (Signed)
Medical screening examination/treatment/procedure(s) were conducted as a shared visit with non-physician practitioner(s) and myself.  I personally evaluated the patient during the encounter  Dysarthria 0/5 RUE/ 1/5 RLE facial droop RRR CTAB NABS  Suspect worsening stroke.  Plan admit  Rector Devonshire K Jesslynn Kruck-Rasch, MD 03/14/12 [phone]

## 2012-03-14 NOTE — Evaluation (Addendum)
Physical Therapy Assessment and Plan/ Treatment  Patient Details  Name: James Bell MRN: [id number] Date of Birth: 1931-08-23  PT Diagnosis: Abnormal posture, Abnormality of gait, Hemiplegia dominant and Impaired sensation Rehab Potential: Excellent ELOS: 14-21 days   Today's Date: 03/14/2012 Time: 0805-0928 Time Calculation (min): 83 min  Problem List:  Patient Active Problem List  Diagnosis  . CVA (cerebrovascular accident)  . Right hemiparesis  . DM type 2 (diabetes mellitus, type 2)  . OSA (obstructive sleep apnea)  . Elevated serum creatinine  . CVA (cerebral vascular accident)    Past Medical History:  Past Medical History  Diagnosis Date  . Hypercholesteremia   . Restless leg syndrome   . Sleep apnea   . Type II diabetes mellitus   . H/O hiatal hernia   . GERD (gastroesophageal reflux disease)   . Headache 03/11/12    "up until ~ 2 yr ago I had them daily"  . Stroke 03/07/12    slurred speech; right hemplegia  . Basal cell carcinoma of nose 2002    "bridge of nose"  . History of kidney problems 12/01/10    "lacerated kidney"   Past Surgical History:  Past Surgical History  Procedure Date  . Prostate surgery 2010    Laser  . Tonsillectomy and adenoidectomy ~ 1945  . Hemorrhoid surgery 1991  . Cataract extraction w/ intraocular lens implant 11//2012 - 08/2011  . Cardiac catheterization 2004    "normal"  . Inguinal hernia repair 1981; 2002    left; "repaired left"  . Skin cancer excision 2002    bridge of nose    Assessment & Plan Clinical Impression: Patient is a 78 y.o. year old male with recent admission to the hospital on 03/11/12 with L posterior putamen and L perivetricular white matter CVA.  Patient transferred to CIR on 03/13/2012 .   Patient currently requires mod with mobility at wheelchair level secondary to muscle weakness and muscle paralysis, impaired timing and sequencing, abnormal tone, unbalanced muscle activation and decreased  coordination and decreased standing balance, decreased postural control, hemiplegia and decreased balance strategies.  Total @+2 for gait.  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  Home access is thresholdLevel entry.  Patient will benefit from skilled PT intervention to maximize safe functional mobility, minimize fall risk and decrease caregiver burden for planned discharge home with 24 hour assist.  Anticipate patient will benefit from follow up OP at discharge.  PT - End of Session Activity Tolerance: Tolerates 30+ min activity without fatigue PT Assessment Rehab Potential: Excellent Barriers to Discharge: None PT Plan PT Frequency: 2-3 X/day, 60-90 minutes Estimated Length of Stay: 14-21 days PT Treatment/Interventions: Ambulation/gait training;Balance/vestibular training;Community reintegration;Discharge planning;DME/adaptive equipment instruction;Functional electrical stimulation;Functional mobility training;Neuromuscular re-education;Patient/family education;Psychosocial support;Splinting/orthotics;Stair training;Therapeutic Activities;Therapeutic Exercise;UE/LE Strength taining/ROM;Wheelchair propulsion/positioning PT Recommendation Follow Up Recommendations: Outpatient PT Equipment Recommended: Rolling walker with 5" wheels;Wheelchair (measurements);Wheelchair cushion (measurements)  PT Evaluation Precautions/Restrictions Precautions Precautions: Fall Pain  No pain reported but pt reports feeling shaky. Home Living/Prior Functioning Home Living Lives With: Significant other Available Help at Discharge: Friend(s) Type of Home: House Home Access: Level entry Entrance Stairs-Number of Steps: threshold Home Layout: One level Prior Function Level of Independence: Independent with basic ADLs;Independent with homemaking with ambulation Able to Take Stairs?: Yes Driving: Yes Vocation:  Full time employment Leisure: Hobbies-yes (Comment) Comments: gardening Vision/Perception  Vision - History Baseline Vision: No visual deficits  Cognition  No gross  cognitive deficits noted during eval will continue to assess. Sensation Sensation Light Touch: Impaired Detail (pt reports neuropathy in feet x 1 yr.) Coordination Gross Motor Movements are Fluid and Coordinated: No Fine Motor Movements are Fluid and Coordinated: No Motor  Motor Motor: Hemiplegia;Abnormal tone;Abnormal postural alignment and control  Mobility Bed Mobility Bed Mobility: Rolling Right;Supine to Sit;Sit to Supine Rolling Right: 5: Supervision Supine to Sit: 3: Mod assist Supine to Sit: Patient Percentage: 50% Supine to Sit Details: Manual facilitation for weight shifting;Manual facilitation for placement Sitting - Scoot to Edge of Bed: 5: Supervision Sit to Supine: 4: Min guard Transfers Sit to Stand: 3: Mod assist Sit to Stand Details: Manual facilitation for weight shifting;Manual facilitation for weight bearing;Tactile cues for weight shifting;Tactile cues for weight beaing Sit to Stand Details (indicate cue type and reason): Pt needed support/facilitation to RLE to promote LE extension, in hip and knee. Stand to Sit: 3: Mod assist Stand to Sit Details (indicate cue type and reason): Manual facilitation for weight shifting;Manual facilitation for placement;Manual facilitation for weight bearing;Tactile cues for weight beaing;Tactile cues for weight shifting Locomotion  Ambulation Ambulation: Yes Ambulation/Gait Assistance: 1: +2 Total assist Ambulation Distance (Feet): 30 Feet Assistive device: Other (Comment) (Sara Plus) ace wrap on R ankle to assist with dorsiflexion. Ambulation/Gait Assistance Details: Tactile cues for placement;Tactile cues for weight shifting;Tactile cues for posture;Verbal cues for sequencing;Verbal cues for technique;Verbal cues for gait pattern;Manual facilitation for weight  shifting;Manual facilitation for placement;Manual facilitation for weight bearing Ambulation/Gait Assistance Details: Pt able to initate swing phase on R, but required max @ to complete swing through.  Trunk/Postural Assessment  Thoracic Assessment Thoracic Assessment: Exceptions to [facility] (Pt with concave R lateral trunk and convex on L. )  Able to activate trunk musculature to correct posture. Balance Static Sitting Balance Static Sitting - Balance Support: Left upper extremity supported Static Sitting - Level of Assistance: 5: Stand by assistance Static Standing Balance Static Standing - Balance Support: Left upper extremity supported Static Standing - Level of Assistance: 3: Mod assist Static Standing - Comment/# of Minutes: 2 Extremity Assessment      RLE Assessment RLE Assessment: Exceptions to [facility] (hip flex 3-/5, hip AB/AD 3-/5, quad 1/5, ankle DF 1/5) LLE Assessment LLE Assessment: Within Functional Limits  See FIM for current functional status Refer to Care Plan for Long Term Goals  Recommendations for other services: None  Discharge Criteria: Patient will be discharged from PT if patient refuses treatment 3 consecutive times without medical reason, if treatment goals not met, if there is a change in medical status, if patient makes no progress towards goals or if patient is discharged from hospital.  The above assessment, treatment plan, treatment alternatives and goals were discussed and mutually agreed upon: by patient and by family  Georges Mouse 03/14/2012, 9:44 AM

## 2012-03-14 NOTE — Evaluation (Signed)
Occupational Therapy Assessment and Plan  Patient Details  Name: James Bell MRN: [id number] Date of Birth: 03-26-1931  OT Diagnosis: abnormal posture, cognitive deficits, hemiplegia affecting dominant side and muscle weakness (generalized) Rehab Potential: Rehab Potential: Good ELOS: 2.5 weeks   Today's Date: 03/14/2012 Time: 1030-1130 Time Calculation (min): 60 min  Problem List:  Patient Active Problem List  Diagnosis  . CVA (cerebrovascular accident)  . Right hemiparesis  . DM type 2 (diabetes mellitus, type 2)  . OSA (obstructive sleep apnea)  . Elevated serum creatinine  . CVA (cerebral vascular accident)    Past Medical History:  Past Medical History  Diagnosis Date  . Hypercholesteremia   . Restless leg syndrome   . Sleep apnea   . Type II diabetes mellitus   . H/O hiatal hernia   . GERD (gastroesophageal reflux disease)   . Headache 03/11/12    "up until ~ 2 yr ago I had them daily"  . Stroke 03/07/12    slurred speech; right hemplegia  . Basal cell carcinoma of nose 2002    "bridge of nose"  . History of kidney problems 12/01/10    "lacerated kidney"   Past Surgical History:  Past Surgical History  Procedure Date  . Prostate surgery 2010    Laser  . Tonsillectomy and adenoidectomy ~ 1945  . Hemorrhoid surgery 1991  . Cataract extraction w/ intraocular lens implant 11//2012 - 08/2011  . Cardiac catheterization 2004    "normal"  . Inguinal hernia repair 1981; 2002    left; "repaired left"  . Skin cancer excision 2002    bridge of nose    Assessment & Plan Clinical Impression: Patient is a 78 y.o. year old male with recent admission to the hospital on 6/24 after being found down at work.  Imaging indicates left posterior basal ganglia and periventricular thrombotic white matter infarct.  Patient transferred to CIR on 03/13/2012 .    Patient currently requires max with basic self-care skills secondary to decreased cardiorespiratoy endurance,  abnormal tone and unbalanced muscle activation and decreased attention and delayed processing.  Prior to hospitalization, patient could complete ADL/IADL with independently..  Patient will benefit from skilled intervention to decrease level of assist with basic self-care skills and increase independence with basic self-care skills prior to discharge home with care partner.  Anticipate patient will require minimal physical assistance and follow up outpatient.  OT - End of Session Activity Tolerance: Tolerates 10 - 20 min activity with multiple rests Endurance Deficit: Yes OT Assessment Rehab Potential: Good Barriers to Discharge: None OT Plan OT Frequency: 1-2 X/day, 60-90 minutes Estimated Length of Stay: 2.5 weeks OT Treatment/Interventions: Balance/vestibular training;Cognitive remediation/compensation;Community reintegration;DME/adaptive equipment instruction;Discharge planning;Functional electrical stimulation;Functional mobility training;Neuromuscular re-education;Patient/family education;Psychosocial support;Self Care/advanced ADL retraining;Splinting/orthotics;Therapeutic Activities;Therapeutic Exercise;UE/LE Strength taining/ROM;UE/LE Coordination activities OT Recommendation Follow Up Recommendations: Outpatient OT Equipment Recommended:  (To be determined OT)  OT Evaluation Precautions/Restrictions  Precautions Precautions: Fall Restrictions Weight Bearing Restrictions: No General Chart Reviewed: Yes Vital Signs   Pain Pain Assessment Pain Assessment: No/denies pain Home Living/Prior Functioning Home Living Lives With: Significant other Available Help at Discharge: Friend(s) Type of Home: House Home Access: Level entry Entrance Stairs-Number of Steps: threshold Home Layout: One level Bathroom Shower/Tub: Walk-in shower;Door (sliding door) IADL History Homemaking Responsibilities: Yes Meal Prep Responsibility: Primary Child Care Responsibility: No Current License:  Yes Mode of Transportation: Car (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) 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[employer]) Prior Function Level of  Independence: Independent with basic ADLs;Independent with homemaking with ambulation;Independent with gait;Independent with transfers Able to Take Stairs?: Yes Driving: Yes Vocation: Full time employment Leisure: Hobbies-yes (Comment) Comments: cooking, gardening, walking his dog,  ADL   Vision/Perception  Vision - History Baseline Vision: No visual deficits Perception Perception: Within Functional Limits Praxis Praxis: Intact  Cognition Arousal/Alertness: Awake/alert Orientation Level: Oriented X4 Memory: Appears intact Awareness: Impaired Awareness Impairment: Emergent impairment Problem Solving: Appears intact Safety/Judgment: Appears intact Sensation Sensation Light Touch: Impaired Detail (pt reports neuropathy in feet x 1 yr.) Coordination Gross Motor Movements are Fluid and Coordinated: No Fine Motor Movements are Fluid and Coordinated: No Coordination and Movement Description: right hemiplegia Motor  Motor Motor: Hemiplegia;Abnormal tone;Abnormal postural alignment and control Mobility  Bed Mobility Bed Mobility: Rolling Right;Supine to Sit;Sit to Supine Rolling Right: 5: Supervision Supine to Sit: 3: Mod assist Supine to Sit: Patient Percentage: 50% Supine to Sit Details: Manual facilitation for weight shifting;Manual facilitation for placement Sitting - Scoot to Edge of Bed: 5: Supervision Sit to Supine: 4: Min guard Transfers Sit to Stand: 3: Mod assist Sit to Stand Details: Manual facilitation for weight shifting;Manual facilitation for weight bearing;Tactile cues for weight shifting;Tactile cues for weight beaing Sit to Stand Details (indicate cue type and reason): Pt needed support/facilitation to RLE to promote LE extension, in hip and knee. Stand to Sit: 3: Mod  assist Stand to Sit Details (indicate cue type and reason): Manual facilitation for weight shifting;Manual facilitation for placement;Manual facilitation for weight bearing;Tactile cues for weight beaing;Tactile cues for weight shifting  Trunk/Postural Assessment  Thoracic Assessment Thoracic Assessment: Exceptions to [facility] (Pt with concave R lateral trunk and convex on L. ) Postural Control Postural Control: Deficits on evaluation (weakness right trunk, falls to right)  Balance Balance Balance Assessed: Yes Static Sitting Balance Static Sitting - Balance Support: No upper extremity supported Static Sitting - Level of Assistance: 4: Min assist Static Standing Balance Static Standing - Balance Support: Right upper extremity supported;Left upper extremity supported Static Standing - Level of Assistance: 3: Mod assist Static Standing - Comment/# of Minutes: 2 Extremity/Trunk Assessment RUE Assessment RUE Assessment: Exceptions to WFL (PROM WFL, active shoulder flexion, elb ext not volitional) LUE Assessment LUE Assessment: Within Functional Limits  See FIM for current functional status Refer to Care Plan for Long Term Goals  Recommendations for other services: None  Discharge Criteria: Patient will be discharged from OT if patient refuses treatment 3 consecutive times without medical reason, if treatment goals not met, if there is a change in medical status, if patient makes no progress towards goals or if patient is discharged from hospital.  The above assessment, treatment plan, treatment alternatives and goals were discussed and mutually agreed upon: by patient and by family  Collier Salina 03/14/2012, 11:53 AM

## 2012-03-14 NOTE — Evaluation (Signed)
Speech Language Pathology Assessment and Plan  Patient Details  Name: James Bell MRN: [id number] Date of Birth: 13-Sep-1931  SLP Diagnosis: Dysarthria;Speech and Language deficits;Cognitive Impairments  Rehab Potential: Good ELOS: 2.5 weeks   Today's Date: 03/14/2012 Time: 4098-1191 Time Calculation (min): 50 min  Problem List:  Patient Active Problem List  Diagnosis  . CVA (cerebrovascular accident)  . Right hemiparesis  . DM type 2 (diabetes mellitus, type 2)  . OSA (obstructive sleep apnea)  . Elevated serum creatinine  . CVA (cerebral vascular accident)   Past Medical History:  Past Medical History  Diagnosis Date  . Hypercholesteremia   . Restless leg syndrome   . Sleep apnea   . Type II diabetes mellitus   . H/O hiatal hernia   . GERD (gastroesophageal reflux disease)   . Headache 03/11/12    "up until ~ 2 yr ago I had them daily"  . Stroke 03/07/12    slurred speech; right hemplegia  . Basal cell carcinoma of nose 2002    "bridge of nose"  . History of kidney problems 12/01/10    "lacerated kidney"   Past Surgical History:  Past Surgical History  Procedure Date  . Prostate surgery 2010    Laser  . Tonsillectomy and adenoidectomy ~ 1945  . Hemorrhoid surgery 1991  . Cataract extraction w/ intraocular lens implant 11//2012 - 08/2011  . Cardiac catheterization 2004    "normal"  . Inguinal hernia repair 1981; 2002    left; "repaired left"  . Skin cancer excision 2002    bridge of nose    Assessment / Plan / Recommendation Clinical Impression  James Bell is a 78 y.o. right-handed male with history of diabetes mellitus and hyperlipidemia. 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 MRI revealed moderate size acute infarct affecting the left posterior putamen and left periventricular white matter. After MBSS 03/11/12, placed on a dysphagia 3 nectar-thick liquid diet.  Transferred to CIR 03/13/12 and  upon evaluation presents with oral-pharyngeal based dysphagia, mild speech-language deficits and cognitive deficits which impact his ability to express his needs/wants, attend to tasks and safely complete BADLs.  AS a result, this patient would benefit from skilled SLP services to address above deficits, maximize functional independence and reduce burden of care upon discharge.       SLP Assessment  Patient will need skilled Speech Lanaguage Pathology Services during CIR admission    Recommendations  Follow up Recommendations: Outpatient SLP Equipment Recommended: None recommended by SLP    SLP Frequency 1-2 X/day, 30-60 minutes;5 out of 7 days   SLP Treatment/Interventions Cognitive remediation/compensation;Cueing hierarchy;Dysphagia/aspiration precaution training;Environmental controls;Functional tasks;Internal/external aids;Oral motor exercises;Patient/family education;Speech/Language facilitation;Therapeutic Activities    Pain Pain Assessment Pain Assessment: No/denies pain Pain Score: 0-No pain Prior Functioning Cognitive/Linguistic Baseline: Within functional limits  Short Term Goals: Week 1: SLP Short Term Goal 1 (Week 1): Patient will verbally express wants and needs with supervision verbal cues SLP Short Term Goal 2 (Week 1): Patient will utilize a slow pace and increased volume with verbal expression at the sentence level with supervision level semantic cues SLP Short Term Goal 3 (Week 1): Patient will attend to right upper extremity with supervision semantic cues SLP Short Term Goal 4 (Week 1): Patient will request help as needed while completing BADLS with minimal assist semantic cues  See FIM for current functional status Refer to Care Plan for Long Term Goals  Recommendations for other services: None  Discharge Criteria:  Patient will be discharged from SLP if patient refuses treatment 3 consecutive times without medical reason, if treatment goals not met, if there is a  change in medical status, if patient makes no progress towards goals or if patient is discharged from hospital.  The above assessment, treatment plan, treatment alternatives and goals were discussed and mutually agreed upon: by patient  Charlane Ferretti., CCC-SLP [phone]  James Bell 03/14/2012, 5:33 PM

## 2012-03-14 NOTE — Progress Notes (Signed)
Patient ID: James Bell, male   DOB: Jul 25, 1931, 78 y.o.   MRN: [id number]  Subjective/Complaints:  HPI: James Bell is a 78 y.o. right-handed male with history of diabetes mellitus and hyperlipidemia. 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 MRI revealed moderate size acute infarct affecting the left posterior putamen and left periventricular white matter. MRA of the head was unremarkable. Carotid Dopplers with no ICA stenosis. Echocardiogram with ejection fraction of 65% and grade 1 diastolic dysfunction. Neurology consulted placed on Plavix therapy. Patient was not a TPA candidate. Subcutaneous Lovenox added for DVT prophylaxis.   ROS Objective: Vital Signs: Blood pressure 121/77, pulse 82, temperature 97.4 F (36.3 C), temperature source Oral, resp. rate 18, height 5\' 9"  (1.753 m), SpO2 97.00%. Dg Swallowing Func-no Report  03/12/2012  CLINICAL DATA: objectively assess swallow function   FLUOROSCOPY FOR SWALLOWING FUNCTION STUDY:  Fluoroscopy was provided for swallowing function study, which was  administered by a speech pathologist.  Final results and recommendations  from this study are contained within the speech pathology report.     Results for orders placed during the hospital encounter of 03/13/12 (from the past 72 hour(s))  GLUCOSE, CAPILLARY     Status: Abnormal   Collection Time   03/13/12  4:47 PM      Component Value Range Comment   Glucose-Capillary 129 (*) 70 - 99 mg/dL   GLUCOSE, CAPILLARY     Status: Abnormal   Collection Time   03/13/12  8:57 PM      Component Value Range Comment   Glucose-Capillary 172 (*) 70 - 99 mg/dL   CBC     Status: Normal   Collection Time   03/14/12  6:10 AM      Component Value Range Comment   WBC 8.8  4.0 - 10.5 K/uL    RBC 4.78  4.22 - 5.81 MIL/uL    Hemoglobin 14.5  13.0 - 17.0 g/dL    HCT 95.6  21.3 - 08.6 %    MCV 88.5  78.0 - 100.0 fL    MCH 30.3  26.0 - 34.0 pg    MCHC 34.3  30.0 - 36.0 g/dL    RDW 57.8  46.9 - 62.9 %    Platelets 189  150 - 400 K/uL   COMPREHENSIVE METABOLIC PANEL     Status: Abnormal   Collection Time   03/14/12  6:10 AM      Component Value Range Comment   Sodium 136  135 - 145 mEq/L    Potassium 4.2  3.5 - 5.1 mEq/L    Chloride 101  96 - 112 mEq/L    CO2 21  19 - 32 mEq/L    Glucose, Bld 167 (*) 70 - 99 mg/dL    BUN 33 (*) 6 - 23 mg/dL    Creatinine, Ser 5.28  0.50 - 1.35 mg/dL    Calcium 9.6  8.4 - 41.3 mg/dL    Total Protein 7.7  6.0 - 8.3 g/dL    Albumin 3.7  3.5 - 5.2 g/dL    AST 18  0 - 37 U/L    ALT 15  0 - 53 U/L    Alkaline Phosphatase 40  39 - 117 U/L    Total Bilirubin 0.5  0.3 - 1.2 mg/dL    GFR calc non Af Amer 56 (*) >90 mL/min    GFR calc Af Amer 64 (*) >90 mL/min   DIFFERENTIAL  Status: Normal   Collection Time   03/14/12  6:10 AM      Component Value Range Comment   Neutrophils Relative 58  43 - 77 %    Neutro Abs 5.1  1.7 - 7.7 K/uL    Lymphocytes Relative 28  12 - 46 %    Lymphs Abs 2.5  0.7 - 4.0 K/uL    Monocytes Relative 11  3 - 12 %    Monocytes Absolute 1.0  0.1 - 1.0 K/uL    Eosinophils Relative 2  0 - 5 %    Eosinophils Absolute 0.2  0.0 - 0.7 K/uL    Basophils Relative 0  0 - 1 %    Basophils Absolute 0.0  0.0 - 0.1 K/uL   GLUCOSE, CAPILLARY     Status: Abnormal   Collection Time   03/14/12  7:20 AM      Component Value Range Comment   Glucose-Capillary 152 (*) 70 - 99 mg/dL    Comment 1 Notify RN        HEENT: normal Cardio: RRR Resp: CTA B/L GI: BS positive Extremity:  No Edema Skin:   Intact Neuro: Alert/Oriented, Cranial Nerve Abnormalities R central VII, Normal Sensory, Abnormal Motor 1/5 R shoulder protraction and Dysarthric,  RLE 2-/5 hipextension Musc/Skel:  Other no shoulder pain   Assessment/Plan: 1. Functional deficits secondary to R putamen infarct with L HP, dysarthria and dysphagia which require 3+ hours per day of interdisciplinary therapy in a comprehensive  inpatient rehab setting. Physiatrist is providing close team supervision and 24 hour management of active medical problems listed below. Physiatrist and rehab team continue to assess barriers to discharge/monitor patient progress toward functional and medical goals. FIM:                   Comprehension Comprehension Mode: Auditory Comprehension: 5-Follows basic conversation/direction: With no assist  Expression Expression Mode: Verbal Expression: 5-Expresses basic needs/ideas: With no assist  Social Interaction Social Interaction: 5-Interacts appropriately 90% of the time - Needs monitoring or encouragement for participation or interaction.  Problem Solving Problem Solving: 5-Solves basic problems: With no assist  Memory Memory: 6-More than reasonable amt of time  Medical Problem List and Plan:  1. 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 DVT Prophylaxis/Anticoagulation: Subcutaneous Lovenox. Monitor platelet counts and any signs of bleeding  3. Dysphagia. Mechanical soft with nectar liquids. Monitor for any signs of aspiration. Speech therapy to followup  4. Neuropsych: This patient is capable of making decisions on his/her own behalf.  5. Diabetes mellitus. Hemoglobin A1c 6.3. Lantus insulin 15 units each bedtime. Patient on 45 units Lantus prior to admission as well as janumet 50-100 twice a day. Check blood sugars a.c. and at bedtime  6. Hypertension. Lisinopril 10 mg daily. Monitor with increased mobility  7. Hyperlipidemia. Lipitor  8. Restless leg syndrome. Continue Mirapex 1 mg daily  9. Obstructive sleep apnea. CPAP. Patient has been using a CPAP machine for approximately 2 years. He is using his face mask from home.   LOS (Days) 1 A FACE TO FACE EVALUATION WAS PERFORMED  James Bell E 03/14/2012, 8:15 AM

## 2012-03-15 DIAGNOSIS — Z5189 Encounter for other specified aftercare: Secondary | ICD-10-CM

## 2012-03-15 DIAGNOSIS — I6330 Cerebral infarction due to thrombosis of unspecified cerebral artery: Secondary | ICD-10-CM

## 2012-03-15 LAB — GLUCOSE, CAPILLARY
Glucose-Capillary: 194 mg/dL — ABNORMAL HIGH (ref 70–99)
Glucose-Capillary: 255 mg/dL — ABNORMAL HIGH (ref 70–99)
Glucose-Capillary: 293 mg/dL — ABNORMAL HIGH (ref 70–99)
Glucose-Capillary: 227 mg/dL — ABNORMAL HIGH (ref 70–99)

## 2012-03-15 MED ORDER — TRAMADOL HCL 50 MG PO TABS
50.0000 mg | ORAL_TABLET | Freq: Four times a day (QID) | ORAL | Status: DC | PRN
  Administered 2012-03-15 – 2012-03-29 (×20): 50 mg via ORAL
  Filled 2012-03-15 (×20): qty 1

## 2012-03-15 MED ORDER — INSULIN GLARGINE 100 UNIT/ML ~~LOC~~ SOLN
20.0000 [IU] | Freq: Every day | SUBCUTANEOUS | Status: DC
  Administered 2012-03-15: 20 [IU] via SUBCUTANEOUS

## 2012-03-15 MED ORDER — TROLAMINE SALICYLATE 10 % EX CREA
TOPICAL_CREAM | Freq: Three times a day (TID) | CUTANEOUS | Status: DC | PRN
  Filled 2012-03-15: qty 85

## 2012-03-15 MED ORDER — MUSCLE RUB 10-15 % EX CREA
TOPICAL_CREAM | Freq: Three times a day (TID) | CUTANEOUS | Status: DC | PRN
  Filled 2012-03-15 (×2): qty 85

## 2012-03-15 MED ORDER — METHOCARBAMOL 500 MG PO TABS
500.0000 mg | ORAL_TABLET | Freq: Four times a day (QID) | ORAL | Status: DC | PRN
  Administered 2012-03-15 – 2012-03-29 (×20): 500 mg via ORAL
  Filled 2012-03-15 (×20): qty 1

## 2012-03-15 NOTE — Progress Notes (Signed)
SLP Cancellation Note  Pt missed 30 minutes of skilled SLP intervention in [place] [place] due to fatigue and pt unable to participate.   Herndon Grill 03/15/2012, 2:03 PM

## 2012-03-15 NOTE — Progress Notes (Signed)
[facility]patient [facility] Individual Statement of Services  Patient Name:  James Bell  Date:  03/15/2012  Welcome to the Inpatient Rehabilitation [facility].  Our goal is to provide you with an individualized program based on your diagnosis and situation, designed to meet your specific needs.  With this comprehensive rehabilitation program, you will be expected to participate in at least 3 hours of rehabilitation therapies Monday-Friday, with modified therapy programming on the weekends.  Your rehabilitation program will include the following services:  Physical Therapy (PT), Occupational Therapy (OT), Speech Therapy (ST), 24 hour per day rehabilitation nursing, Therapeutic Recreaction (TR), Case Management (RN and Child psychotherapist), Rehabilitation Medicine, Nutrition Services and Pharmacy Services  Weekly team conferences will be held on Wednesdays to discuss your progress.  Your RN Case Designer, television/film set will talk with you frequently to get your input and to update you on team discussions.  Team conferences with you and your family in attendance may also be held.  Expected length of stay: 14-21 days   Overall anticipated outcome: Minimum assistance - supervision  Depending on your progress and recovery, your program may change.  Your RN Case Estate agent will coordinate services and will keep you informed of any changes.  Your RN Sports coach and SW names and contact numbers are listed  below.  The following services may also be recommended but are not provided by the Inpatient Rehabilitation Center:   Driving Evaluations  Home Health Rehabiltiation Services  Outpatient Rehabilitatation [facility]  Vocational Rehabilitation   Arrangements will be made to provide these services after discharge if needed.  Arrangements include referral to agencies that provide these services.  Your insurance has been verified to be:  Medicare + Tricare Your primary doctor is:  Dr  Talmage Coin  Pertinent information will be shared with your doctor and your insurance company.  Case Manager: Lutricia Horsfall, [facility] [phone]  Social Worker:  Dossie Der, [place] [phone]  Information discussed with and copy given to patient by: Meryl Dare, 03/15/2012

## 2012-03-15 NOTE — Progress Notes (Signed)
Physical Therapy Session Note  Patient Details  Name: James Bell MRN: [id number] Date of Birth: November 04, 1930  Today's Date: 03/15/2012 Time: 0905-1000 Time Calculation (min): 55 min  Short Term Goals: Week 1:  PT Short Term Goal 1 (Week 1): Transfer to the left, squat pivot with min@ consistently PT Short Term Goal 2 (Week 1): Supine to sit from the left side with supervision PT Short Term Goal 3 (Week 1): W/c propulsion x 50' with L extremities min@ in controlled environment PT Short Term Goal 4 (Week 1): Gait with appropriate assistive device x 25' with max@.  Skilled Therapeutic Interventions/Progress Updates:    NMR to improve control of trunk and R extremeities to aid mobility.  Sidelying and supine PNF to RLE,  Trunk work (pt with frequent LOB seated when he moves requiring max A to prevent fall- NT informed pt needs someone with him with toileting for fall prevention), postural control in standing- decreased R knee control noted and pt knee either flexed or hyperextended without physical assist; reaching with LUE to encourage extension on RLE, squats, pt instructed to perform scapular retractions and glut sets in room; instructed on RUE positioning to prevent subluxation and given hand outs  Therapy Documentation Precautions:  Precautions Precautions: Fall Restrictions Weight Bearing Restrictions: No    Vital Signs:125 with standing activity   Pain: Pain Assessment Pain Assessment: No/denies pain Mobility: Bed Mobility Rolling Right: 5: Supervision Rolling Right Details: Verbal cues for technique Rolling Left: 4: Min assist Rolling Left Details: Verbal cues for sequencing;Verbal cues for technique;Visual cues/gestures for precautions/safety;Manual facilitation for placement Transfers Sit to Stand: 3: Mod assist Stand to Sit: 3: Mod assist Stand Pivot Transfers: 2: Max assist L  Other Treatments: Treatments Neuromuscular Facilitation: Right;Upper Extremity;Lower  Extremity;Forced use;Activity to increase motor control;Activity to increase sustained activation;Activity to increase timing and sequencing;Activity to increase grading;Activity to increase lateral weight shifting;Activity to increase anterior-posterior weight shifting  See FIM for current functional status  Therapy/Group: Individual Therapy  Michaelene Song 03/15/2012, 12:17 PM

## 2012-03-15 NOTE — Progress Notes (Signed)
Speech Language Pathology Daily Session Note  Patient Details  Name: James Bell MRN: [id number] Date of Birth: 05-Oct-1930  Today's Date: 03/15/2012 Time: 0800-0900 Time Calculation (min): 60 min  Short Term Goals: Week 1:  SLP Short Term Goal 1 (Week 1): Patient will verbally express wants and needs with supervision verbal cues SLP Short Term Goal 1 - Progress (Week 1): Progressing toward goal SLP Short Term Goal 2 (Week 1): Patient will utilize a slow pace and increased volume with verbal expression at the sentence level with supervision level semantic cues SLP Short Term Goal 2 - Progress (Week 1): Progressing toward goal SLP Short Term Goal 3 (Week 1): Patient will attend to right upper extremity with supervision semantic cues SLP Short Term Goal 3 - Progress (Week 1): Progressing toward goal SLP Short Term Goal 4 (Week 1): Patient will request help as needed while completing BADLS with minimal assist semantic cues SLP Short Term Goal 4 - Progress (Week 1): Progressing toward goal SLP Short Term Goal 5 (Week 1): Patient will utilize diaphragmatic breath support/control strategy with sentence level expression with minimal verbal guidance/cues.  Skilled Therapeutic Interventions: Treatment focused on functional use of speech intelligibility strategies within a mildly controlled context (e.g., verbalizing answers to complex, abstract written questions) while physically removed from SLP without the assistance of non-verbal communicative cues, requiring minimal verbal prompts to maintain the strategy of increased vocal intensity.  During this exercise, SLP identified decreased breath support.  Set a new goal for diaphragmatic breathing exercises in order to best direct subglottic air for functional communication.  Patient was attentive to right upper extremity throughout sessin while seated at the table without any cues (modified independent).   FIM:  Comprehension Comprehension Mode:  Auditory Comprehension: 6-Follows complex conversation/direction: With extra time/assistive device Expression Expression Mode: Verbal Expression: 5-Expresses basic needs/ideas: With extra time/assistive device Social Interaction Social Interaction: 6-Interacts appropriately with others with medication or extra time (anti-anxiety, antidepressant). Problem Solving Problem Solving: 5-Solves basic 90% of the time/requires cueing < 10% of the time Memory Memory: 6-More than reasonable amt of time  Pain Pain Assessment Pain Assessment: No/denies pain  Therapy/Group: Individual Therapy  Myra Rude, M.S.,CCC-SLP Pager [phone] 03/15/2012, 9:41 AM

## 2012-03-15 NOTE — Progress Notes (Addendum)
Patient ID: James Bell, male   DOB: 04-May-1931, 78 y.o.   MRN: [id number]  Subjective/Complaints:  HPI: James Bell is a 78 y.o. right-handed male with history of diabetes mellitus and hyperlipidemia. 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 MRI revealed moderate size acute infarct affecting the left posterior putamen and left periventricular white matter. MRA of the head was unremarkable. Carotid Dopplers with no ICA stenosis. Echocardiogram with ejection fraction of 65% and grade 1 diastolic dysfunction. Neurology consulted placed on Plavix therapy. Patient was not a TPA candidate. Subcutaneous Lovenox added for DVT prophylaxis.   ROS Objective: Vital Signs: Blood pressure 131/68, pulse 75, temperature 97.8 F (36.6 C), temperature source Oral, resp. rate 17, height 5\' 9"  (1.753 m), weight 78.9 kg (173 lb 15.1 oz), SpO2 95.00%. No results found. Results for orders placed during the hospital encounter of 03/13/12 (from the past 72 hour(s))  GLUCOSE, CAPILLARY     Status: Abnormal   Collection Time   03/13/12  4:47 PM      Component Value Range Comment   Glucose-Capillary 129 (*) 70 - 99 mg/dL   GLUCOSE, CAPILLARY     Status: Abnormal   Collection Time   03/13/12  8:57 PM      Component Value Range Comment   Glucose-Capillary 172 (*) 70 - 99 mg/dL   CBC     Status: Normal   Collection Time   03/14/12  6:10 AM      Component Value Range Comment   WBC 8.8  4.0 - 10.5 K/uL    RBC 4.78  4.22 - 5.81 MIL/uL    Hemoglobin 14.5  13.0 - 17.0 g/dL    HCT 40.9  81.1 - 91.4 %    MCV 88.5  78.0 - 100.0 fL    MCH 30.3  26.0 - 34.0 pg    MCHC 34.3  30.0 - 36.0 g/dL    RDW 78.2  95.6 - 21.3 %    Platelets 189  150 - 400 K/uL   COMPREHENSIVE METABOLIC PANEL     Status: Abnormal   Collection Time   03/14/12  6:10 AM      Component Value Range Comment   Sodium 136  135 - 145 mEq/L    Potassium 4.2  3.5 - 5.1 mEq/L    Chloride 101   96 - 112 mEq/L    CO2 21  19 - 32 mEq/L    Glucose, Bld 167 (*) 70 - 99 mg/dL    BUN 33 (*) 6 - 23 mg/dL    Creatinine, Ser 0.86  0.50 - 1.35 mg/dL    Calcium 9.6  8.4 - 57.8 mg/dL    Total Protein 7.7  6.0 - 8.3 g/dL    Albumin 3.7  3.5 - 5.2 g/dL    AST 18  0 - 37 U/L    ALT 15  0 - 53 U/L    Alkaline Phosphatase 40  39 - 117 U/L    Total Bilirubin 0.5  0.3 - 1.2 mg/dL    GFR calc non Af Amer 56 (*) >90 mL/min    GFR calc Af Amer 64 (*) >90 mL/min   DIFFERENTIAL     Status: Normal   Collection Time   03/14/12  6:10 AM      Component Value Range Comment   Neutrophils Relative 58  43 - 77 %    Neutro Abs 5.1  1.7 - 7.7 K/uL  Lymphocytes Relative 28  12 - 46 %    Lymphs Abs 2.5  0.7 - 4.0 K/uL    Monocytes Relative 11  3 - 12 %    Monocytes Absolute 1.0  0.1 - 1.0 K/uL    Eosinophils Relative 2  0 - 5 %    Eosinophils Absolute 0.2  0.0 - 0.7 K/uL    Basophils Relative 0  0 - 1 %    Basophils Absolute 0.0  0.0 - 0.1 K/uL   GLUCOSE, CAPILLARY     Status: Abnormal   Collection Time   03/14/12  7:20 AM      Component Value Range Comment   Glucose-Capillary 152 (*) 70 - 99 mg/dL    Comment 1 Notify RN     GLUCOSE, CAPILLARY     Status: Abnormal   Collection Time   03/14/12 11:45 AM      Component Value Range Comment   Glucose-Capillary 193 (*) 70 - 99 mg/dL    Comment 1 Notify RN     GLUCOSE, CAPILLARY     Status: Abnormal   Collection Time   03/14/12  4:42 PM      Component Value Range Comment   Glucose-Capillary 196 (*) 70 - 99 mg/dL   GLUCOSE, CAPILLARY     Status: Abnormal   Collection Time   03/14/12  9:15 PM      Component Value Range Comment   Glucose-Capillary 269 (*) 70 - 99 mg/dL   GLUCOSE, CAPILLARY     Status: Abnormal   Collection Time   03/15/12  7:08 AM      Component Value Range Comment   Glucose-Capillary 194 (*) 70 - 99 mg/dL    Comment 1 Notify RN        HEENT: normal Cardio: RRR Resp: CTA B/L GI: BS positive Extremity:  No Edema Skin:    Intact Neuro: Alert/Oriented, Cranial Nerve Abnormalities R central VII, Normal Sensory, Abnormal Motor 1/5 R shoulder protraction and Dysarthric,  RLE 2-/5 hipextension Musc/Skel:  Other no shoulder pain, L snuff box, L raial styloid , L 1st CMC tenderness, Limited ROM in thumb flex ext   Assessment/Plan: 1. Functional deficits secondary to R putamen infarct with L HP, dysarthria and dysphagia which require 3+ hours per day of interdisciplinary therapy in a comprehensive inpatient rehab setting. Physiatrist is providing close team supervision and 24 hour management of active medical problems listed below. Physiatrist and rehab team continue to assess barriers to discharge/monitor patient progress toward functional and medical goals. FIM: FIM - Bathing Bathing Steps Patient Completed: Chest;Abdomen;Front perineal area;Buttocks;Right upper leg;Left upper leg Bathing: 3: Mod-Patient completes 5-7 56f 10 parts or 50-74%  FIM - Upper Body Dressing/Undressing Upper body dressing/undressing: 1: Total-Patient completed less than 25% of tasks FIM - Lower Body Dressing/Undressing Lower body dressing/undressing steps patient completed: Thread/unthread left underwear leg;Thread/unthread left pants leg Lower body dressing/undressing: 2: Max-Patient completed 25-49% of tasks  FIM - Toileting Toileting: 0: Activity did not occur  FIM - Diplomatic Services operational officer Devices: Psychiatrist Transfers: 1-Two helpers  FIM - Banker Devices: Arm rests Bed/Chair Transfer: 2: Supine > Sit: Max A (lifting assist/Pt. 25-49%);2: Bed > Chair or W/C: Max A (lift and lower assist)  FIM - Locomotion: Ambulation Ambulation/Gait Assistance: 1: +2 Total assist  Comprehension Comprehension Mode: Auditory Comprehension: 5-Follows basic conversation/direction: With extra time/assistive device  Expression Expression Mode: Verbal Expression:  5-Expresses basic needs/ideas: With extra time/assistive device  Social Interaction Social Interaction: 6-Interacts appropriately with others with medication or extra time (anti-anxiety, antidepressant).  Problem Solving Problem Solving: 5-Solves basic 90% of the time/requires cueing < 10% of the time  Memory Memory: 6-More than reasonable amt of time  Medical Problem List and Plan:  1. 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 DVT Prophylaxis/Anticoagulation: Subcutaneous Lovenox. Monitor platelet counts and any signs of bleeding  3. Dysphagia. Mechanical soft with nectar liquids. Monitor for any signs of aspiration. Speech therapy to followup  4. Neuropsych: This patient is capable of making decisions on his/her own behalf.  5. Diabetes mellitus. Hemoglobin A1c 6.3.Increase Lantus insulin 20 units each bedtime. Patient on 45 units Lantus prior to admission as well as janumet 50-100 twice a day. Check blood sugars a.c. and at bedtime  6. Hypertension. Lisinopril 10 mg daily. Monitor with increased mobility  7. Hyperlipidemia. Lipitor  8. Restless leg syndrome. Continue Mirapex 1 mg daily  9. Obstructive sleep apnea. CPAP. Patient has been using a CPAP machine for approximately 2 years. He is using his face mask from home.   LOS (Days) 2 A FACE TO FACE EVALUATION WAS PERFORMED  Torien Ramroop E 03/15/2012, 8:29 AM

## 2012-03-15 NOTE — Progress Notes (Signed)
Social Work  Social Work Assessment and Plan  Patient Details  Name: James Bell MRN: [id number] Date of Birth: 12-03-1930  Today's Date: 03/15/2012  Problem List:  Patient Active Problem List  Diagnosis  . CVA (cerebrovascular accident)  . Right hemiparesis  . DM type 2 (diabetes mellitus, type 2)  . OSA (obstructive sleep apnea)  . Elevated serum creatinine  . CVA (cerebral vascular accident)   Past Medical History:  Past Medical History  Diagnosis Date  . Hypercholesteremia   . Restless leg syndrome   . Sleep apnea   . Type II diabetes mellitus   . H/O hiatal hernia   . GERD (gastroesophageal reflux disease)   . Headache 03/11/12    "up until ~ 2 yr ago I had them daily"  . Stroke 03/07/12    slurred speech; right hemplegia  . Basal cell carcinoma of nose 2002    "bridge of nose"  . History of kidney problems 12/01/10    "lacerated kidney"   Past Surgical History:  Past Surgical History  Procedure Date  . Prostate surgery 2010    Laser  . Tonsillectomy and adenoidectomy ~ 1945  . Hemorrhoid surgery 1991  . Cataract extraction w/ intraocular lens implant 11//2012 - 08/2011  . Cardiac catheterization 2004    "normal"  . Inguinal hernia repair 1981; 2002    left; "repaired left"  . Skin cancer excision 2002    bridge of nose   Social History:  reports that he quit smoking about 35 years ago. His smoking use included Pipe. He has never used smokeless tobacco. He reports that he drinks about 2.4 ounces of alcohol per week. He reports that he does not use illicit drugs.  Family / Support Systems Marital Status: Divorced Patient Roles: Partner;Parent (employee) Spouse/Significant Other: girlfriend of 2 years (and his neighbor) [name] Apple @ (C) [phone] Children: daughter, Tyrone Schimke [facility]) @ (H) [phone], (C) [phone] or [phone] Other Supports: other daughter, Aurea Graff lives in [place]; local [place]) family member is pt's nephew,  Cian Costanzo @ [phone], (C) [phone] or (W) [phone];  Anticipated Caregiver: Family(dtr)- Lurena Joiner; friend [name] Apple Ability/Limitations of Caregiver: Pt's daughter and pt confirm that Ms .Apple is retired, but daughter wants to ensure that she is completely comfortable with being pt's caregiver as well. Caregiver Availability: 24/7 Family Dynamics: Pt reports that his daughter and his nephew are very supportive and willing to assist as they are able.  Social History Preferred language: English Religion: Protestant Cultural Background: NA Education: MD of Ophthamology Read: Yes Write: Yes Employment Status: Employed (considers himself "semi-retired") Name of Employer: Scientist, research (physical sciences) Return to Work Plans: Pt hopeful her can return to his work which he splits between offices in [place] and [place] - anticipated he may need to "cut back" now due to stroke. Legal Hisotry/Current Legal Issues: none Guardian/Conservator: daughter, Lurena Joiner is his POA   Abuse/Neglect Physical Abuse: Denies Verbal Abuse: Denies Sexual Abuse: Denies Exploitation of patient/patient's resources: Denies Self-Neglect: Denies  Emotional Status Pt's affect, behavior adn adjustment status: Pt speaks in very matter-of-fact manner about his stroke and how it may affect his ability to work.  Is aware he will likely require assistance at d/c.  Denies any significant emotional distress and no s/s of depression or anxiety reported by pt or family.  Daughter concerned that he may become depressed if he cannot eventually return to work.  No indication that depression screen is needed at this time, however, will  monitor throughout stay. Recent Psychosocial Issues: None Pyschiatric History: None Substance Abuse History: None  Patient / Family Perceptions, Expectations & Goals Pt/Family understanding of illness & functional limitations: Pt and daughter with basic understanding of his stroke and  resulting functional limitations.  Aware that assist at d/c is likely to be necessary.  Both admit they are unsure how much physical improvment to anticipate.  Premorbid pt/family roles/activities: Elderly gentleman who was living independently and still maintain his opth. practice which took him out of town several days each week.  Daughters supportive, but living out of town and working. Anticipated changes in roles/activities/participation: Pt's girlfriend likely to assume the role of the caregiver. Pt/family expectations/goals: Pt and daughter hopeful pt can regain as much independence as possible so to decrease the amount of dependency he will place on girlfriend  [employer]: None Premorbid Home Care/DME Agencies: None Transportation available at discharge: yes Resource referrals recommended: Support group (specify) (Stroke Support Group)  Discharge Planning Living Arrangements: Alone Support Systems: Children;Spouse/significant other;Friends/neighbors;Other relatives Type of Residence: Private residence Insurance Resources: [employer] Financial Resources: Employment;Social Security Financial Screen Referred: No Living Expenses: Own Money Management: Patient Do you have any problems obtaining your medications?: No Home Management: patient Patient/Family Preliminary Plans: Pt plans to return to his home with girlfriend to provide assistance. Social Work Anticipated Follow Up Needs: HH/OP;Support Group DC Planning Additional Notes/Comments: as noted, daughter concerned and wanting to ensure that Ms. Apple is fully aware and agreeable with assuming the role of caregiver at d/c. Expected length of stay: 2-3 weeks  Clinical Impression Pleasant gentleman here after having had a stroke.  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  Good family support but little local support.  Girlfriend anticipated to be primary caregiver,  however, daughter does not want her to feel uncomfortable or forced into this role.  Plan to meet with all involved parties after initial team conference.  Amada Jupiter  Cipriana Biller 03/15/2012, 3:35 PM

## 2012-03-15 NOTE — Progress Notes (Signed)
Occupational Therapy Session Note  Patient Details  Name: James Bell MRN: [id number] Date of Birth: 05-Apr-1931  Today's Date: 03/15/2012 Time: 1030-1120 Time Calculation (min): 50 min  Short Term Goals: Week 1:  OT Short Term Goal 1 (Week 1): Patient will stand at sink using right upper extremity as support, with no more than min assist during bathing and dressing tasks. OT Short Term Goal 2 (Week 1): Patient will transfer to toilet with min assist OT Short Term Goal 3 (Week 1): Patient will dress upper body with mod assist OT Short Term Goal 4 (Week 1): Patient will dress lower body with mod assist OT Short Term Goal 5 (Week 1): Patient will complete toileting with max assist  Skilled Therapeutic Interventions/Progress Updates:    Patient seen this am for OT bathing and dressing.  Plan was for patient to take a shower, however, patient too exhausted after back to back therapy all morning.  Patient opted to wash up at sink.  Patient requiring physical and verbal cues to sustain right knee control in standing.  Patient with increased muscle tone in right pectoralis, yet unable to access volitionally.  Patient assisted to bed after 50 min for rest prior to next scheduled session.   Therapy Documentation Precautions:  Precautions Precautions: Fall Restrictions Weight Bearing Restrictions: No   Pain: Pain Assessment Pain Assessment: No/denies pain  See FIM for current functional status  Therapy/Group: Individual Therapy  Collier Salina 03/15/2012, 11:29 AM

## 2012-03-15 NOTE — Progress Notes (Signed)
Per State Regulation 482.30 This chart was reviewed for medical necessity with respect to the patient's Admission/Duration of stay. Lane[place], [place] Anne                 Nurse Care Manager            Next Review Date: 03/18/12   

## 2012-03-16 LAB — GLUCOSE, CAPILLARY
Glucose-Capillary: 194 mg/dL — ABNORMAL HIGH (ref 70–99)
Glucose-Capillary: 215 mg/dL — ABNORMAL HIGH (ref 70–99)
Glucose-Capillary: 189 mg/dL — ABNORMAL HIGH (ref 70–99)
Glucose-Capillary: 300 mg/dL — ABNORMAL HIGH (ref 70–99)

## 2012-03-16 MED ORDER — INSULIN GLARGINE 100 UNIT/ML ~~LOC~~ SOLN
25.0000 [IU] | Freq: Every day | SUBCUTANEOUS | Status: DC
  Administered 2012-03-16: 25 [IU] via SUBCUTANEOUS

## 2012-03-16 NOTE — Progress Notes (Signed)
Occupational Therapy Session Note  Patient Details  Name: James Bell MRN: [id number] Date of Birth: 1931/05/29  Today's Date: 03/16/2012 Time: 4098-1191 Time Calculation (min): 42 min  Short Term Goals: Week 1:  OT Short Term Goal 1 (Week 1): Patient will stand at sink using right upper extremity as support, with no more than min assist during bathing and dressing tasks. OT Short Term Goal 2 (Week 1): Patient will transfer to toilet with min assist OT Short Term Goal 3 (Week 1): Patient will dress upper body with mod assist OT Short Term Goal 4 (Week 1): Patient will dress lower body with mod assist OT Short Term Goal 5 (Week 1): Patient will complete toileting with max assist  Skilled Therapeutic Interventions/Progress Updates:    1:1 self care retraining at shower level. Focus on transitional movements with bed mobility to come to EOB, squat pivot transfers bed<w/c<toilet<w/c<tub bench<w/c with max A (lift and lower for control), attention to trunk upright posture, sit to stand with attention to weight bearing through right LE without hyperextension of right knee, adjusting posture before attention to pants, hemi dressing techniques, activity tolerance, endurance. Pt with increased fatigue at end of session.  Therapy Documentation Precautions:  Precautions Precautions: Fall Restrictions Weight Bearing Restrictions: No    Pain: Pain Assessment Pain Assessment: No/denies pain Pain Score: 0-No pain  See FIM for current functional status  Therapy/Group: Individual Therapy  Roney Mans [facility] 03/16/2012, 12:00 PM

## 2012-03-16 NOTE — Progress Notes (Signed)
Speech Language Pathology Daily Session Note  Patient Details  Name: James Bell MRN: [id number] Date of Birth: 1931/01/18  Today's Date: 03/16/2012 Time: 0817-0900 Time Calculation (min): 43 min  Short Term Goals: Week 1: SLP Short Term Goal 1 (Week 1): Patient will verbally express wants and needs with supervision verbal cues SLP Short Term Goal 1 - Progress (Week 1): Progressing toward goal SLP Short Term Goal 2 (Week 1): Patient will utilize a slow pace and increased volume with verbal expression at the sentence level with supervision level semantic cues SLP Short Term Goal 2 - Progress (Week 1): Progressing toward goal SLP Short Term Goal 3 (Week 1): Patient will attend to right upper extremity with supervision semantic cues SLP Short Term Goal 3 - Progress (Week 1): Progressing toward goal SLP Short Term Goal 4 (Week 1): Patient will request help as needed while completing BADLS with minimal assist semantic cues SLP Short Term Goal 4 - Progress (Week 1): Progressing toward goal SLP Short Term Goal 5 (Week 1): Patient will utilize diaphragmatic breath support/control strategy with sentence level expression with minimal verbal guidance/cues. SLP Short Term Goal 5 - Progress (Week 1): Progressing toward goal  Skilled Therapeutic Interventions: Treatment focused on completion of oral motor exercies X10 for buccal and labial strength and ROM with minimal verbal cues needed.  Review and functional demonstration of speech stategies (slow, loud, open mouth, pause/ breathe) for verballly answering complex questions with minimal verbal propmting needed. Breath support exercises completed 8-10 times with  moderate verbal cues and repeat demonstration needed. Pt also competed oral care with set up assist at sink after breakfast meal; trial water protocol then administered to evaluate Pt's safe consumption of thin water with swallow strategies in place. No overt s/ of aspiration noted; Pt was  pleased with presentation of water; took approximately 4 oz during session.      FIM:  Comprehension Comprehension Mode: Auditory Comprehension: 6-Follows complex conversation/direction: With extra time/assistive device Expression Expression Mode: Verbal Expression: 5-Expresses complex 90% of the time/cues < 10% of the time Social Interaction Social Interaction: 6-Interacts appropriately with others with medication or extra time (anti-anxiety, antidepressant). Problem Solving Problem Solving: 5-Solves basic problems: With no assist Memory Memory: 6-More than reasonable amt of time  Pain Pain Assessment Pain Assessment: No/denies pain Pain Score: 0-No pain  Therapy/Group: Individual Therapy  [name] 03/16/2012, 10:36 AM

## 2012-03-16 NOTE — Progress Notes (Signed)
Occupational Therapy Session Note  Patient Details  Name: CHRISTON PARADA MRN: [id number] Date of Birth: 10-25-1930  Today's Date: 03/16/2012 Time: 1215-1245 Time Calculation (min): 30 min  Skilled Therapeutic Interventions/Progress Updates: [employer] and patient able to maintain safe postural control for safe self feeding.  Required assist to place right hand on table and attempt to use as gross assist.       Therapy Documentation Precautions:  Precautions Precautions: Fall Restrictions Weight Bearing Restrictions: No  Pain:denied See FIM for current functional status  Therapy/Group: Group Therapy  Rozelle Logan 03/16/2012, 4:32 PM

## 2012-03-16 NOTE — Progress Notes (Signed)
Physical Therapy Note  Patient Details  Name: ELICK AGUILERA MRN: [id number] Date of Birth: October 28, 1930 Today's Date: 03/16/2012  1308-6578 (55 minutes) individual Pain: no complaint of pain Focus of treatment: Neuro re-ed RT LE; therapeutic activities to facilitate RT knee terminal knee extension in stance Treatment: Transfers - scoot mod assist to right; sit to supine min assist RT LE; Neuro re-ed RT LE- AA heel slides, AA hip abduction, manual resistance to RE knee/hip extension, active hip ER/IR; standing in standing frame with left knee flexed and belt to prevent substituting RT hip extension to attain knee extension; standing with rail Left- stepping forward/backward with LT LE to increase weight bearing on right and tactile cues to prevent forward trunk extension to maintain left knee extension.   Marybell Robards,[name] 03/16/2012, 9:56 AM

## 2012-03-16 NOTE — Progress Notes (Signed)
Patient ID: James Bell, male   DOB: 10-17-1930, 78 y.o.   MRN: [id number] Subjective/Complaints:  HPI: James Bell is a 78 y.o. right-handed male with history of diabetes mellitus and hyperlipidemia. 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 MRI revealed moderate size acute infarct affecting the left posterior putamen and left periventricular white matter. MRA of the head was unremarkable. Carotid Dopplers with no ICA stenosis. Echocardiogram with ejection fraction of 65% and grade 1 diastolic dysfunction. Neurology consulted placed on Plavix therapy. Patient was not a TPA candidate. Subcutaneous Lovenox added for DVT prophylaxis.  Slept well last noc ROS Objective: Vital Signs: Blood pressure 163/83, pulse 72, temperature 97.1 F (36.2 C), temperature source Axillary, resp. rate 20, height 5\' 9"  (1.753 m), weight 78.9 kg (173 lb 15.1 oz), SpO2 98.00%. No results found. Results for orders placed during the hospital encounter of 03/13/12 (from the past 72 hour(s))  GLUCOSE, CAPILLARY     Status: Abnormal   Collection Time   03/13/12  4:47 PM      Component Value Range Comment   Glucose-Capillary 129 (*) 70 - 99 mg/dL   GLUCOSE, CAPILLARY     Status: Abnormal   Collection Time   03/13/12  8:57 PM      Component Value Range Comment   Glucose-Capillary 172 (*) 70 - 99 mg/dL   CBC     Status: Normal   Collection Time   03/14/12  6:10 AM      Component Value Range Comment   WBC 8.8  4.0 - 10.5 K/uL    RBC 4.78  4.22 - 5.81 MIL/uL    Hemoglobin 14.5  13.0 - 17.0 g/dL    HCT 40.9  81.1 - 91.4 %    MCV 88.5  78.0 - 100.0 fL    MCH 30.3  26.0 - 34.0 pg    MCHC 34.3  30.0 - 36.0 g/dL    RDW 78.2  95.6 - 21.3 %    Platelets 189  150 - 400 K/uL   COMPREHENSIVE METABOLIC PANEL     Status: Abnormal   Collection Time   03/14/12  6:10 AM      Component Value Range Comment   Sodium 136  135 - 145 mEq/L    Potassium 4.2  3.5 - 5.1  mEq/L    Chloride 101  96 - 112 mEq/L    CO2 21  19 - 32 mEq/L    Glucose, Bld 167 (*) 70 - 99 mg/dL    BUN 33 (*) 6 - 23 mg/dL    Creatinine, Ser 0.86  0.50 - 1.35 mg/dL    Calcium 9.6  8.4 - 57.8 mg/dL    Total Protein 7.7  6.0 - 8.3 g/dL    Albumin 3.7  3.5 - 5.2 g/dL    AST 18  0 - 37 U/L    ALT 15  0 - 53 U/L    Alkaline Phosphatase 40  39 - 117 U/L    Total Bilirubin 0.5  0.3 - 1.2 mg/dL    GFR calc non Af Amer 56 (*) >90 mL/min    GFR calc Af Amer 64 (*) >90 mL/min   DIFFERENTIAL     Status: Normal   Collection Time   03/14/12  6:10 AM      Component Value Range Comment   Neutrophils Relative 58  43 - 77 %    Neutro Abs 5.1  1.7 - 7.7 K/uL  Lymphocytes Relative 28  12 - 46 %    Lymphs Abs 2.5  0.7 - 4.0 K/uL    Monocytes Relative 11  3 - 12 %    Monocytes Absolute 1.0  0.1 - 1.0 K/uL    Eosinophils Relative 2  0 - 5 %    Eosinophils Absolute 0.2  0.0 - 0.7 K/uL    Basophils Relative 0  0 - 1 %    Basophils Absolute 0.0  0.0 - 0.1 K/uL   GLUCOSE, CAPILLARY     Status: Abnormal   Collection Time   03/14/12  7:20 AM      Component Value Range Comment   Glucose-Capillary 152 (*) 70 - 99 mg/dL    Comment 1 Notify RN     GLUCOSE, CAPILLARY     Status: Abnormal   Collection Time   03/14/12 11:45 AM      Component Value Range Comment   Glucose-Capillary 193 (*) 70 - 99 mg/dL    Comment 1 Notify RN     GLUCOSE, CAPILLARY     Status: Abnormal   Collection Time   03/14/12  4:42 PM      Component Value Range Comment   Glucose-Capillary 196 (*) 70 - 99 mg/dL   GLUCOSE, CAPILLARY     Status: Abnormal   Collection Time   03/14/12  9:15 PM      Component Value Range Comment   Glucose-Capillary 269 (*) 70 - 99 mg/dL   GLUCOSE, CAPILLARY     Status: Abnormal   Collection Time   03/15/12  7:08 AM      Component Value Range Comment   Glucose-Capillary 194 (*) 70 - 99 mg/dL    Comment 1 Notify RN     GLUCOSE, CAPILLARY     Status: Abnormal   Collection Time   03/15/12 11:25 AM       Component Value Range Comment   Glucose-Capillary 255 (*) 70 - 99 mg/dL    Comment 1 Notify RN     GLUCOSE, CAPILLARY     Status: Abnormal   Collection Time   03/15/12  4:40 PM      Component Value Range Comment   Glucose-Capillary 293 (*) 70 - 99 mg/dL   GLUCOSE, CAPILLARY     Status: Abnormal   Collection Time   03/15/12  8:53 PM      Component Value Range Comment   Glucose-Capillary 227 (*) 70 - 99 mg/dL   GLUCOSE, CAPILLARY     Status: Abnormal   Collection Time   03/16/12  7:30 AM      Component Value Range Comment   Glucose-Capillary 194 (*) 70 - 99 mg/dL    Comment 1 Notify RN        HEENT: normal Cardio: RRR Resp: CTA B/L GI: BS positive Extremity:  No Edema Skin:   Intact Neuro: Alert/Oriented, Cranial Nerve Abnormalities R central VII, Normal Sensory, Abnormal Motor 1/5 R shoulder protraction and Dysarthric,  RLE 2-/5 hipextension Musc/Skel:  Other no shoulder pain, L snuff box, L raial styloid , L 1st CMC tenderness, Limited ROM in thumb flex ext   Assessment/Plan: 1. Functional deficits secondary to R putamen infarct with L HP, dysarthria and dysphagia which require 3+ hours per day of interdisciplinary therapy in a comprehensive inpatient rehab setting. Physiatrist is providing close team supervision and 24 hour management of active medical problems listed below. Physiatrist and rehab team continue to assess barriers to discharge/monitor patient progress toward  functional and medical goals. FIM: FIM - Bathing Bathing Steps Patient Completed: Chest;Right Arm;Abdomen;Right upper leg;Left upper leg;Buttocks;Front perineal area Bathing: 3: Mod-Patient completes 5-7 69f 10 parts or 50-74%  FIM - Upper Body Dressing/Undressing Upper body dressing/undressing steps patient completed: Thread/unthread left sleeve of pullover shirt/dress;Put head through opening of pull over shirt/dress;Pull shirt over trunk Upper body dressing/undressing: 4: Min-Patient completed 75  plus % of tasks FIM - Lower Body Dressing/Undressing Lower body dressing/undressing steps patient completed: Thread/unthread left underwear leg;Thread/unthread left pants leg Lower body dressing/undressing: 2: Max-Patient completed 25-49% of tasks  FIM - Toileting Toileting: 0: Activity did not occur  FIM - Diplomatic Services operational officer Devices: Psychiatrist Transfers: 0-Activity did not occur  FIM - Banker Devices: Arm rests Bed/Chair Transfer: 3: Supine > Sit: Mod A (lifting assist/Pt. 50-74%/lift 2 legs;3: Sit > Supine: Mod A (lifting assist/Pt. 50-74%/lift 2 legs);2: Bed > Chair or W/C: Max A (lift and lower assist);2: Chair or W/C > Bed: Max A (lift and lower assist)  FIM - Locomotion: Wheelchair Locomotion: Wheelchair: 1: Total Assistance/staff pushes wheelchair (Pt<25%) FIM - Locomotion: Ambulation Ambulation/Gait Assistance: 1: +2 Total assist Locomotion: Ambulation: 0: Activity did not occur  Comprehension Comprehension Mode: Auditory Comprehension: 6-Follows complex conversation/direction: With extra time/assistive device  Expression Expression Mode: Verbal Expression: 5-Expresses basic needs/ideas: With no assist  Social Interaction Social Interaction: 6-Interacts appropriately with others with medication or extra time (anti-anxiety, antidepressant).  Problem Solving Problem Solving: 5-Solves basic problems: With no assist  Memory Memory: 6-More than reasonable amt of time  Medical Problem List and Plan:  1. 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 DVT Prophylaxis/Anticoagulation: Subcutaneous Lovenox. Monitor platelet counts and any signs of bleeding  3. Dysphagia. Mechanical soft with nectar liquids. Monitor for any signs of aspiration. Speech therapy to followup  4. Neuropsych: This patient is capable of making decisions on his/her own behalf.  5.  Diabetes mellitus.Uncontrolled Hemoglobin A1c 6.3.Increase Lantus insulin 25 units each bedtime. Patient on 45 units Lantus prior to admission as well as janumet 50-100 twice a day. Check blood sugars a.c. and at bedtime  6. Hypertension. Lisinopril 10 mg daily. Monitor with increased mobility  7. Hyperlipidemia. Lipitor  8. Restless leg syndrome. Continue Mirapex 1 mg daily  9. Obstructive sleep apnea. CPAP. Patient has been using a CPAP machine for approximately 2 years. He is using his face mask from home.   LOS (Days) 3 A FACE TO FACE EVALUATION WAS PERFORMED  Keon Pender E 03/16/2012, 9:07 AM

## 2012-03-16 NOTE — Progress Notes (Signed)
Speech Language Pathology Daily Session Note  Patient Details  Name: ROGERS DITTER MRN: [id number] Date of Birth: June 29, 1931  Today's Date: 03/16/2012 Time: 4098-1191 Time Calculation (min): 30 min   Skilled Therapeutic Interventions: Group therapy treatment focused on using swallow strategies with min verbal cues given to take small bites and check pocketing on left side. no over s/s of aspiration noted.  (with diet of Dysphagia 3 and nectars.)  FIM:  FIM - Eating Eating Activity: 5: Needs verbal cues/supervision  Pain Pain Assessment Pain Assessment: No/denies pain  Therapy/Group: Group Therapy  [name] 03/16/2012, 4:17 PM

## 2012-03-17 LAB — GLUCOSE, CAPILLARY
Glucose-Capillary: 210 mg/dL — ABNORMAL HIGH (ref 70–99)
Glucose-Capillary: 199 mg/dL — ABNORMAL HIGH (ref 70–99)
Glucose-Capillary: 258 mg/dL — ABNORMAL HIGH (ref 70–99)
Glucose-Capillary: 201 mg/dL — ABNORMAL HIGH (ref 70–99)

## 2012-03-17 MED ORDER — INSULIN GLARGINE 100 UNIT/ML ~~LOC~~ SOLN
30.0000 [IU] | Freq: Every day | SUBCUTANEOUS | Status: DC
  Administered 2012-03-17: 30 [IU] via SUBCUTANEOUS

## 2012-03-17 NOTE — Progress Notes (Signed)
Physical Therapy Note  Patient Details  Name: James Bell MRN: [id number] Date of Birth: July 12, 1931 Today's Date: 03/17/2012  1100-1150 (50 minutes) individual Pain: no complaint of pain Focus of treatment: Neuro re-ed RT LE; gait training with appropriate assistive device Treatment: Transfers scoot to right min assist + assist with wc setup; sit to supine SBA (mat) with difficulty RT LE; RT LE neuro re-ed - heel slides with decreased eccentric control; SAQs pt unable to sustain quad contraction > 5 seconds; AA hip abduction, bridging; supine to side to sit from right side SBA with difficulty; Sit to stand from edge of mat min assist with lean to right; standing with left UE support min assist with tactile or vcs to maintain Rt knee extension; gait PFRW (right) 6 feet X 2 mod/max assist (ace wrap rt ankle) for balance and to prevent adduction of RT LE during swing. Pt hyperextends RT LE in stance (followed closely by wc for safety).   Lorice Lafave,[name] 03/17/2012, 11:47 AM

## 2012-03-17 NOTE — Progress Notes (Signed)
Patient ID: James Bell, male   DOB: 09/05/1931, 78 y.o.   MRN: [id number] Subjective/Complaints:  HPI: James Bell is a 78 y.o. right-handed male with history of diabetes mellitus and hyperlipidemia. 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 MRI revealed moderate size acute infarct affecting the left posterior putamen and left periventricular white matter. MRA of the head was unremarkable. Carotid Dopplers with no ICA stenosis. Echocardiogram with ejection fraction of 65% and grade 1 diastolic dysfunction. Neurology consulted placed on Plavix therapy. Patient was not a TPA candidate. Subcutaneous Lovenox added for DVT prophylaxis.  Slept well last noc  Review of Systems  Musculoskeletal: Positive for joint pain.       R shoulder  Neurological: Positive for speech change.   Objective: Vital Signs: Blood pressure 117/71, pulse 68, temperature 97.6 F (36.4 C), temperature source Oral, resp. rate 18, height 5\' 9"  (1.753 m), weight 78.9 kg (173 lb 15.1 oz), SpO2 97.00%. No results found. Results for orders placed during the hospital encounter of 03/13/12 (from the past 72 hour(s))  GLUCOSE, CAPILLARY     Status: Abnormal   Collection Time   03/14/12 11:45 AM      Component Value Range Comment   Glucose-Capillary 193 (*) 70 - 99 mg/dL    Comment 1 Notify RN     GLUCOSE, CAPILLARY     Status: Abnormal   Collection Time   03/14/12  4:42 PM      Component Value Range Comment   Glucose-Capillary 196 (*) 70 - 99 mg/dL   GLUCOSE, CAPILLARY     Status: Abnormal   Collection Time   03/14/12  9:15 PM      Component Value Range Comment   Glucose-Capillary 269 (*) 70 - 99 mg/dL   GLUCOSE, CAPILLARY     Status: Abnormal   Collection Time   03/15/12  7:08 AM      Component Value Range Comment   Glucose-Capillary 194 (*) 70 - 99 mg/dL    Comment 1 Notify RN     GLUCOSE, CAPILLARY     Status: Abnormal   Collection Time   03/15/12 11:25  AM      Component Value Range Comment   Glucose-Capillary 255 (*) 70 - 99 mg/dL    Comment 1 Notify RN     GLUCOSE, CAPILLARY     Status: Abnormal   Collection Time   03/15/12  4:40 PM      Component Value Range Comment   Glucose-Capillary 293 (*) 70 - 99 mg/dL   GLUCOSE, CAPILLARY     Status: Abnormal   Collection Time   03/15/12  8:53 PM      Component Value Range Comment   Glucose-Capillary 227 (*) 70 - 99 mg/dL   GLUCOSE, CAPILLARY     Status: Abnormal   Collection Time   03/16/12  7:30 AM      Component Value Range Comment   Glucose-Capillary 194 (*) 70 - 99 mg/dL    Comment 1 Notify RN     GLUCOSE, CAPILLARY     Status: Abnormal   Collection Time   03/16/12 12:19 PM      Component Value Range Comment   Glucose-Capillary 215 (*) 70 - 99 mg/dL    Comment 1 Notify RN     GLUCOSE, CAPILLARY     Status: Abnormal   Collection Time   03/16/12  4:36 PM      Component Value Range Comment  Glucose-Capillary 189 (*) 70 - 99 mg/dL    Comment 1 Notify RN     GLUCOSE, CAPILLARY     Status: Abnormal   Collection Time   03/16/12  9:09 PM      Component Value Range Comment   Glucose-Capillary 300 (*) 70 - 99 mg/dL    Comment 1 Notify RN     GLUCOSE, CAPILLARY     Status: Abnormal   Collection Time   03/17/12  7:29 AM      Component Value Range Comment   Glucose-Capillary 210 (*) 70 - 99 mg/dL    Comment 1 Notify RN        HEENT: normal Cardio: RRR Resp: CTA B/L GI: BS positive Extremity:  No Edema Skin:   Intact Neuro: Alert/Oriented, Cranial Nerve Abnormalities R central VII, Normal Sensory, Abnormal Motor 1/5 R shoulder protraction and Dysarthric,  RLE 2-/5 hipextension Musc/Skel:  Other no shoulder pain, L snuff box, L raial styloid , L 1st CMC tenderness, Limited ROM in thumb flex ext   Assessment/Plan: 1. Functional deficits secondary to R putamen infarct with L HP, dysarthria and dysphagia which require 3+ hours per day of interdisciplinary therapy in a comprehensive  inpatient rehab setting. Physiatrist is providing close team supervision and 24 hour management of active medical problems listed below. Physiatrist and rehab team continue to assess barriers to discharge/monitor patient progress toward functional and medical goals. FIM: FIM - Bathing Bathing Steps Patient Completed: Chest;Right Arm;Abdomen;Front perineal area;Right upper leg;Left upper leg;Right lower leg (including foot);Left lower leg (including foot) Bathing: 4: Min-Patient completes 8-9 17f 10 parts or 75+ percent  FIM - Upper Body Dressing/Undressing Upper body dressing/undressing steps patient completed: Thread/unthread left sleeve of pullover shirt/dress;Put head through opening of pull over shirt/dress;Pull shirt over trunk Upper body dressing/undressing: 4: Min-Patient completed 75 plus % of tasks FIM - Lower Body Dressing/Undressing Lower body dressing/undressing steps patient completed: Thread/unthread left underwear leg;Thread/unthread left pants leg Lower body dressing/undressing: 2: Max-Patient completed 25-49% of tasks  FIM - Hotel manager Devices: Grab bar or rail for support Toileting: 1: Total-Patient completed zero steps, helper did all 3  FIM - Diplomatic Services operational officer Devices: Grab bars Toilet Transfers: 2-From toilet/BSC: Max A (lift and lower assist);2-To toilet/BSC: Max A (lift and lower assist)  FIM - Banker Devices: Arm rests Bed/Chair Transfer: 2: Bed > Chair or W/C: Max A (lift and lower assist);2: Supine > Sit: Max A (lifting assist/Pt. 25-49%)  FIM - Locomotion: Wheelchair Locomotion: Wheelchair: 1: Total Assistance/staff pushes wheelchair (Pt<25%) FIM - Locomotion: Ambulation Ambulation/Gait Assistance: 1: +2 Total assist Locomotion: Ambulation: 0: Activity did not occur  Comprehension Comprehension Mode: Auditory Comprehension: 7-Follows complex conversation/direction: With  no assist  Expression Expression Mode: Verbal Expression: 6-Expresses complex ideas: With extra time/assistive device  Social Interaction Social Interaction: 6-Interacts appropriately with others with medication or extra time (anti-anxiety, antidepressant).  Problem Solving Problem Solving: 5-Solves complex 90% of the time/cues < 10% of the time  Memory Memory: 6-More than reasonable amt of time  Medical Problem List and Plan:  1. 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 DVT Prophylaxis/Anticoagulation: Subcutaneous Lovenox. Monitor platelet counts and any signs of bleeding  3. Dysphagia. Mechanical soft with nectar liquids. Monitor for any signs of aspiration. Speech therapy to followup  4. Neuropsych: This patient is capable of making decisions on his/her own behalf.  5. Diabetes mellitus.Uncontrolled Hemoglobin A1c 6.3.Increase Lantus insulin 30  units each bedtime. Patient on 45 units Lantus prior to admission as well as janumet 50-100 twice a day. Check blood sugars a.c. and at bedtime  6. Hypertension. Lisinopril 10 mg daily. Monitor with increased mobility  7. Hyperlipidemia. Lipitor  8. Restless leg syndrome. Continue Mirapex 1 mg daily  9. Obstructive sleep apnea. CPAP. Patient has been using a CPAP machine for approximately 2 years. He is using his face mask from home.   LOS (Days) 4 A FACE TO FACE EVALUATION WAS PERFORMED  Nikala Walsworth E 03/17/2012, 9:08 AM

## 2012-03-18 DIAGNOSIS — Z5189 Encounter for other specified aftercare: Secondary | ICD-10-CM

## 2012-03-18 DIAGNOSIS — I6330 Cerebral infarction due to thrombosis of unspecified cerebral artery: Secondary | ICD-10-CM

## 2012-03-18 DIAGNOSIS — G8110 Spastic hemiplegia affecting unspecified side: Secondary | ICD-10-CM

## 2012-03-18 LAB — GLUCOSE, CAPILLARY
Glucose-Capillary: 196 mg/dL — ABNORMAL HIGH (ref 70–99)
Glucose-Capillary: 201 mg/dL — ABNORMAL HIGH (ref 70–99)
Glucose-Capillary: 185 mg/dL — ABNORMAL HIGH (ref 70–99)
Glucose-Capillary: 175 mg/dL — ABNORMAL HIGH (ref 70–99)

## 2012-03-18 MED ORDER — INSULIN GLARGINE 100 UNIT/ML ~~LOC~~ SOLN
35.0000 [IU] | Freq: Every day | SUBCUTANEOUS | Status: DC
  Administered 2012-03-18: 35 [IU] via SUBCUTANEOUS

## 2012-03-18 NOTE — Progress Notes (Signed)
Per State Regulation 482.30 This chart was reviewed for medical necessity with respect to the patient's Admission/Duration of stay. Pt participating in therapies with slow steady progress. Meryl Dare                 Nurse Care Manager            Next Review Date: 03/22/12

## 2012-03-18 NOTE — Progress Notes (Signed)
Occupational Therapy Session Note  Patient Details  Name: James Bell MRN: [id number] Date of Birth: 1930/10/12  Today's Date: 03/18/2012 Time: 0730-0830 Time Calculation (min): 60 min  Short Term Goals: Week 1:  OT Short Term Goal 1 (Week 1): Patient will stand at sink using right upper extremity as support, with no more than min assist during bathing and dressing tasks. OT Short Term Goal 2 (Week 1): Patient will transfer to toilet with min assist OT Short Term Goal 3 (Week 1): Patient will dress upper body with mod assist OT Short Term Goal 4 (Week 1): Patient will dress lower body with mod assist OT Short Term Goal 5 (Week 1): Patient will complete toileting with max assist  Skilled Therapeutic Interventions/Progress Updates:  Patient participated in ADL in shower this AM. Patient initially very impulsive during sit-to-stands. Patient did not wait for therapist before starting to stand. Patient was educated to not stand up so quickly due to right side hemiplegia which will cause him to lose his balance easier. Once patient slowed down and took his time with sit to stands he went from a Max A to a Mod A. Patient needed Min vc's for safety while showering. Patient dressed at sink. Patient educated regarding hemi-dressing techniques with RUE. Patient slightly fatigued at end of ADL. Energy conservation techniques discussed. Kinesiotape applied to RUE shoulder to increase tactile input and increase proprioception of the shoulder muscles and joint and to position shoulder in better alignment to allow shoulder muscles to contract. Patient educated on kinesiotape. Patient verbalized understanding.   Therapy Documentation Precautions:  Precautions Precautions: Fall Restrictions Weight Bearing Restrictions: No Pain: Pain Assessment Pain Assessment: 0-10 Pain Score:   4 Pain Location: Shoulder Pain Orientation: Right Pain Descriptors: Aching Pain Intervention(s): RN made aware  See  FIM for current functional status  Therapy/Group: Individual Therapy  Danyetta Gillham, Charisse March, OTR/L 03/18/2012, 9:32 AM

## 2012-03-18 NOTE — Progress Notes (Signed)
OccupationalTherapy Note  Patient Details  Name: James Bell MRN: [id number] Date of Birth: Jan 31, 1931 Today's Date: 03/18/2012  11:45-12:15   30 Min.  [employer] self feeding group with focus on use of non-dominant LUE for set up and self feeding, use of RUE as stabilizer, and conversation.  Patient required verbal and tactile assistance for above tasks.     James Bell 03/18/2012, 4:50 PM

## 2012-03-18 NOTE — Progress Notes (Signed)
Inpatient Diabetes Program Recommendations  AACE/ADA: New Consensus Statement on Inpatient Glycemic Control (2009)  Target Ranges:  Prepandial:   less than 140 mg/dL      Peak postprandial:   less than 180 mg/dL (1-2 hours)      Critically ill patients:  140 - 180 mg/dL   Reason for Visit: Post-prandial hyperglycemia Inpatient Diabetes Program Recommendations Oral Agents: Pt takes Januvia 50 mg at home.  Hospital formulary uses Tradjenta . Equivalent dosage of Tradjenta to Januvia would be 10 mg/day .  Please consider ordering this DPP4 inhibiitor Noted increase in Lantus by 5 unit increments to hopefully lower fasting glucose to less than 150 mg/dL  Note: Thank you, Lenor Coffin, RN, CNS, Diabetes Coordinator [phone])

## 2012-03-18 NOTE — Progress Notes (Signed)
Speech Language Pathology Daily Session Note  Patient Details  Name: James Bell MRN: [id number] Date of Birth: December 11, 1930  Today's Date: 03/18/2012 Time: 4098-1191 Time Calculation (min): 30 min  Short Term Goals: Week 1: SLP Short Term Goal 1 (Week 1): Patient will verbally express wants and needs with supervision verbal cues SLP Short Term Goal 1 - Progress (Week 1): Progressing toward goal SLP Short Term Goal 2 (Week 1): Patient will utilize a slow pace and increased volume with verbal expression at the sentence level with supervision level semantic cues SLP Short Term Goal 2 - Progress (Week 1): Progressing toward goal SLP Short Term Goal 3 (Week 1): Patient will attend to right upper extremity with supervision semantic cues SLP Short Term Goal 3 - Progress (Week 1): Progressing toward goal SLP Short Term Goal 4 (Week 1): Patient will request help as needed while completing BADLS with minimal assist semantic cues SLP Short Term Goal 4 - Progress (Week 1): Progressing toward goal SLP Short Term Goal 5 (Week 1): Patient will utilize diaphragmatic breath support/control strategy with sentence level expression with minimal verbal guidance/cues. SLP Short Term Goal 5 - Progress (Week 1): Progressing toward goal  Skilled Therapeutic Interventions: Pt. was very lethargic this pm and session was cut 15 minutes short.  Therapy focused  on dysphagia, dysarthria and respiratory support during conversational speech.  Pt. cleaned oral cavity prior to having thin water via the water protocol with throat clear with each trial x 3 indicative of continues decreased airway protection.  Pt. recalled speaking louder and taking deep breaths as speech strategies and required min-mod verbal cues to elicit during his descriptions of various scenarios/oobjects.   FIM:  Comprehension Comprehension: 5-Understands complex 90% of the time/Cues < 10% of the time Expression Expression Mode:  Verbal Expression: 3-Expresses basic 50 - 74% of the time/requires cueing 25 - 50% of the time. Needs to repeat parts of sentences. Social Interaction Social Interaction: 6-Interacts appropriately with others with medication or extra time (anti-anxiety, antidepressant). Problem Solving Problem Solving: 5-Solves basic 90% of the time/requires cueing < 10% of the time Memory Memory: 4-Recognizes or recalls 75 - 89% of the time/requires cueing 10 - 24% of the time  Pain Pain Assessment Pain Assessment: No/denies pain  Therapy/Group: Individual Therapy  Royce Macadamia 03/18/2012, 1:56 PM

## 2012-03-18 NOTE — Progress Notes (Signed)
Speech Language Pathology Daily Session Note [facility]  Patient Details  Name: James Bell MRN: [id number] Date of Birth: 09-23-1930  Today's Date: 03/18/2012 Time: 1130-1145 Time Calculation (min): 15 min  Short Term Goals: Week 1: SLP Short Term Goal 1 (Week 1): Patient will verbally express wants and needs with supervision verbal cues SLP Short Term Goal 1 - Progress (Week 1): Progressing toward goal SLP Short Term Goal 2 (Week 1): Patient will utilize a slow pace and increased volume with verbal expression at the sentence level with supervision level semantic cues SLP Short Term Goal 2 - Progress (Week 1): Progressing toward goal SLP Short Term Goal 3 (Week 1): Patient will attend to right upper extremity with supervision semantic cues SLP Short Term Goal 3 - Progress (Week 1): Progressing toward goal SLP Short Term Goal 4 (Week 1): Patient will request help as needed while completing BADLS with minimal assist semantic cues SLP Short Term Goal 4 - Progress (Week 1): Progressing toward goal SLP Short Term Goal 5 (Week 1): Patient will utilize diaphragmatic breath support/control strategy with sentence level expression with minimal verbal guidance/cues. SLP Short Term Goal 5 - Progress (Week 1): Progressing toward goal  Skilled Therapeutic Interventions: Pt seen in [facility] with focus on utilization of swallowing compensatory strategies. Pt without overt s/s of aspiration and required verbal cueing for functional problem solving to utilize RUE as a stabilizer to peel his banana. Pt with decreased speech intelligibility but was ~90% intelligible at the sentence level and required encouragement for social interaction.    FIM:  Comprehension Comprehension Mode: Auditory Comprehension: 5-Understands complex 90% of the time/Cues < 10% of the time Expression Expression Mode: Verbal Expression: 3-Expresses basic 50 - 74% of the time/requires cueing 25 - 50% of the time. Needs to  repeat parts of sentences. Social Interaction Social Interaction: 6-Interacts appropriately with others with medication or extra time (anti-anxiety, antidepressant). Problem Solving Problem Solving: 5-Solves basic 90% of the time/requires cueing < 10% of the time Memory Memory: 4-Recognizes or recalls 75 - 89% of the time/requires cueing 10 - 24% of the time FIM - Eating Eating Activity: 5: Set-up assist for open containers;5: Supervision/cues  Pain Pain Assessment Pain Assessment: 0-10 Pain Score:   6 Pain Type: Acute pain Pain Location: Arm Pain Orientation: Right Pain Descriptors: Aching Pain Intervention(s): Medication (See eMAR)  Therapy/Group: Individual Therapy  Natlie Asfour 03/18/2012, 3:51 PM

## 2012-03-18 NOTE — Progress Notes (Signed)
Physical Therapy Session Note  Patient Details  Name: James Bell MRN: [id number] Date of Birth: 21-Aug-1931  Today's Date: 03/18/2012 Time: 0905-1004 Time Calculation (min): 59 min  Short Term Goals: Week 1:  PT Short Term Goal 1 (Week 1): Transfer to the left, squat pivot with min@ consistently PT Short Term Goal 2 (Week 1): Supine to sit from the left side with supervision PT Short Term Goal 3 (Week 1): W/c propulsion x 50' with L extremities min@ in controlled environment PT Short Term Goal 4 (Week 1): Gait with appropriate assistive device x 25' with max@.  Therapy Documentation Precautions:  Precautions Precautions: Fall Restrictions Weight Bearing Restrictions: No Pain: Pain Assessment Pain Assessment: No/denies pain Pain Score: 0-No pain Other Treatments: Treatments Therapeutic Activity: Patient performed w/c mobility training on unit for increased mobility independence in room and on unit with mod A with verbal, visual and tactile cues for propulsion sequence with LUE and LLE and for sequencing and problem solving to prevent running into wall on R and changing directions.  Observed transfer to mat to L with squat scoot max A with LOB to R when leaning R.  Following NMR and trunk control training performed squat pivot transfers to R back to chair and from w/c > bed with mod A with verbal and tactile cues for patient to perform slow and controlled and to maintain trunk elongation during anterior lean and to maintain squat position during fully pivot to prevent R LOB.  Bed mobility min-mod A; will address during next session.  Neuromuscular Facilitation: Right;Lower Extremity;Activity to increase coordination;Activity to increase motor control;Activity to increase timing and sequencing;Activity to increase grading;Activity to increase sustained activation;Activity to increase lateral weight shifting;Activity to increase anterior-posterior weight shifting in supine during  isolated and controlled activation of RLE hamstrings, quads and glute muscles during hip IR/ER, heel slides, HS sets with knee flexion, open chain hip ABD, closed chain hip ABD against belt for isometric contraction, closed chain hip ABD with bilat bridges, closed chain hip ABD with rotational bridges with verbal and tactile cues to minimize over contraction from L side.  Sitting reaching to L and R with RUE on therapy ball for lateral weight shifts while maintaining head over BOS to prevent LOB with trunk elongation <> shortening.    See FIM for current functional status  Therapy/Group: Individual Therapy  Edman Circle Faucette 03/18/2012, 12:20 PM

## 2012-03-18 NOTE — Progress Notes (Signed)
Patient ID: James Bell, male   DOB: 22-May-1931, 78 y.o.   MRN: [id number] Subjective/Complaints:  HPI: James Bell is a 78 y.o. right-handed male with history of diabetes mellitus and hyperlipidemia. 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 MRI revealed moderate size acute infarct affecting the left posterior putamen and left periventricular white matter. MRA of the head was unremarkable. Carotid Dopplers with no ICA stenosis. Echocardiogram with ejection fraction of 65% and grade 1 diastolic dysfunction. Neurology consulted placed on Plavix therapy. Patient was not a TPA candidate. Subcutaneous Lovenox added for DVT prophylaxis.  Slept well last noc  Review of Systems  Musculoskeletal: Positive for joint pain.       R shoulder  Neurological: Positive for speech change.   Objective: Vital Signs: Blood pressure 134/65, pulse 73, temperature 98.3 F (36.8 C), temperature source Oral, resp. rate 18, height 5\' 9"  (1.753 m), weight 78.9 kg (173 lb 15.1 oz), SpO2 95.00%. No results found. Results for orders placed during the hospital encounter of 03/13/12 (from the past 72 hour(s))  GLUCOSE, CAPILLARY     Status: Abnormal   Collection Time   03/15/12 11:25 AM      Component Value Range Comment   Glucose-Capillary 255 (*) 70 - 99 mg/dL    Comment 1 Notify RN     GLUCOSE, CAPILLARY     Status: Abnormal   Collection Time   03/15/12  4:40 PM      Component Value Range Comment   Glucose-Capillary 293 (*) 70 - 99 mg/dL   GLUCOSE, CAPILLARY     Status: Abnormal   Collection Time   03/15/12  8:53 PM      Component Value Range Comment   Glucose-Capillary 227 (*) 70 - 99 mg/dL   GLUCOSE, CAPILLARY     Status: Abnormal   Collection Time   03/16/12  7:30 AM      Component Value Range Comment   Glucose-Capillary 194 (*) 70 - 99 mg/dL    Comment 1 Notify RN     GLUCOSE, CAPILLARY     Status: Abnormal   Collection Time   03/16/12 12:19  PM      Component Value Range Comment   Glucose-Capillary 215 (*) 70 - 99 mg/dL    Comment 1 Notify RN     GLUCOSE, CAPILLARY     Status: Abnormal   Collection Time   03/16/12  4:36 PM      Component Value Range Comment   Glucose-Capillary 189 (*) 70 - 99 mg/dL    Comment 1 Notify RN     GLUCOSE, CAPILLARY     Status: Abnormal   Collection Time   03/16/12  9:09 PM      Component Value Range Comment   Glucose-Capillary 300 (*) 70 - 99 mg/dL    Comment 1 Notify RN     GLUCOSE, CAPILLARY     Status: Abnormal   Collection Time   03/17/12  7:29 AM      Component Value Range Comment   Glucose-Capillary 210 (*) 70 - 99 mg/dL    Comment 1 Notify RN     GLUCOSE, CAPILLARY     Status: Abnormal   Collection Time   03/17/12 11:54 AM      Component Value Range Comment   Glucose-Capillary 199 (*) 70 - 99 mg/dL    Comment 1 Notify RN     GLUCOSE, CAPILLARY     Status: Abnormal  Collection Time   03/17/12  4:35 PM      Component Value Range Comment   Glucose-Capillary 258 (*) 70 - 99 mg/dL    Comment 1 Notify RN     GLUCOSE, CAPILLARY     Status: Abnormal   Collection Time   03/17/12  9:05 PM      Component Value Range Comment   Glucose-Capillary 201 (*) 70 - 99 mg/dL   GLUCOSE, CAPILLARY     Status: Abnormal   Collection Time   03/18/12  7:20 AM      Component Value Range Comment   Glucose-Capillary 196 (*) 70 - 99 mg/dL    Comment 1 Notify RN        HEENT: normal Cardio: RRR Resp: CTA B/L GI: BS positive Extremity:  No Edema Skin:   Intact Neuro: Alert/Oriented, Cranial Nerve Abnormalities R central VII, Normal Sensory, Abnormal Motor 1/5 R shoulder protraction and Dysarthric,  RLE 2-/5 hipextension, knee ext synergy, 0/5 R ankle Musc/Skel:  Other no shoulder pain, L snuff box, L raial styloid , L 1st CMC tenderness, Limited ROM in thumb flex ext   Assessment/Plan: 1. Functional deficits secondary to R putamen infarct with L HP, dysarthria and dysphagia which require 3+ hours per  day of interdisciplinary therapy in a comprehensive inpatient rehab setting. Physiatrist is providing close team supervision and 24 hour management of active medical problems listed below. Physiatrist and rehab team continue to assess barriers to discharge/monitor patient progress toward functional and medical goals. FIM: FIM - Bathing Bathing Steps Patient Completed: Chest;Right Arm;Abdomen;Front perineal area;Right upper leg;Left upper leg;Right lower leg (including foot);Left lower leg (including foot) Bathing: 4: Min-Patient completes 8-9 95f 10 parts or 75+ percent  FIM - Upper Body Dressing/Undressing Upper body dressing/undressing steps patient completed: Thread/unthread left sleeve of pullover shirt/dress;Put head through opening of pull over shirt/dress;Pull shirt over trunk Upper body dressing/undressing: 4: Min-Patient completed 75 plus % of tasks FIM - Lower Body Dressing/Undressing Lower body dressing/undressing steps patient completed: Thread/unthread left underwear leg;Thread/unthread left pants leg Lower body dressing/undressing: 2: Max-Patient completed 25-49% of tasks  FIM - Hotel manager Devices: Grab bar or rail for support Toileting: 1: Total-Patient completed zero steps, helper did all 3  FIM - Diplomatic Services operational officer Devices: Grab bars Toilet Transfers: 2-To toilet/BSC: Max A (lift and lower assist);2-From toilet/BSC: Max A (lift and lower assist)  FIM - Banker Devices: Arm rests Bed/Chair Transfer: 2: Bed > Chair or W/C: Max A (lift and lower assist);2: Chair or W/C > Bed: Max A (lift and lower assist)  FIM - Locomotion: Wheelchair Locomotion: Wheelchair: 1: Total Assistance/staff pushes wheelchair (Pt<25%) FIM - Locomotion: Ambulation Ambulation/Gait Assistance: 1: +2 Total assist Locomotion: Ambulation: 0: Activity did not occur  Comprehension Comprehension Mode:  Auditory Comprehension: 7-Follows complex conversation/direction: With no assist  Expression Expression Mode: Verbal Expression: 7-Expresses complex ideas: With no assist  Social Interaction Social Interaction: 7-Interacts appropriately with others - No medications needed.  Problem Solving Problem Solving: 7-Solves complex problems: Recognizes & self-corrects  Memory Memory: 7-Complete Independence: No helper  Medical Problem List and Plan:  1. 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 DVT Prophylaxis/Anticoagulation: Subcutaneous Lovenox. Monitor platelet counts and any signs of bleeding  3. Dysphagia. Mechanical soft with nectar liquids. Monitor for any signs of aspiration. Speech therapy to followup  4. Neuropsych: This patient is capable of making decisions on his/her own behalf.  5.  Diabetes mellitus.Uncontrolled Hemoglobin A1c 6.3.Increase Lantus insulin 35 units each bedtime. Patient on 45 units Lantus prior to admission as well as janumet 50-100 twice a day. Check blood sugars a.c. and at bedtime  6. Hypertension. Lisinopril 10 mg daily. Monitor with increased mobility  7. Hyperlipidemia. Lipitor  8. Restless leg syndrome. Continue Mirapex 1 mg daily  9. Obstructive sleep apnea. CPAP. Patient has been using a CPAP machine for approximately 2 years. He is using his face mask from home.   LOS (Days) 5 A FACE TO FACE EVALUATION WAS PERFORMED  Larisa Lanius E 03/18/2012, 8:11 AM

## 2012-03-19 LAB — GLUCOSE, CAPILLARY
Glucose-Capillary: 154 mg/dL — ABNORMAL HIGH (ref 70–99)
Glucose-Capillary: 249 mg/dL — ABNORMAL HIGH (ref 70–99)
Glucose-Capillary: 201 mg/dL — ABNORMAL HIGH (ref 70–99)
Glucose-Capillary: 241 mg/dL — ABNORMAL HIGH (ref 70–99)

## 2012-03-19 MED ORDER — INSULIN GLARGINE 100 UNIT/ML ~~LOC~~ SOLN
40.0000 [IU] | Freq: Every day | SUBCUTANEOUS | Status: DC
  Administered 2012-03-19: 40 [IU] via SUBCUTANEOUS

## 2012-03-19 MED ORDER — TAMSULOSIN HCL 0.4 MG PO CAPS
0.4000 mg | ORAL_CAPSULE | Freq: Every day | ORAL | Status: DC
  Administered 2012-03-19: 0.4 mg via ORAL
  Filled 2012-03-19 (×8): qty 1

## 2012-03-19 NOTE — Progress Notes (Signed)
OccupationalTherapy Note  Patient Details  Name: James Bell MRN: [id number] Date of Birth: [birth year] Today's Date: 03/19/2012  11:45-12:15   30 Min.  [facility] self feeding group with focus on use of non-dominant LUE for set up and self feeding and use of RUE as stabilizer.  Patient does not initiate using RUE as a stabilizer instead tries to use LUE for one and t handed tasks. required verbal and tactile assistance for above tasks.     [name] 03/19/2012, 3:50 PM

## 2012-03-19 NOTE — Progress Notes (Signed)
Nursing Note: Pt up to bathroom attempting to void for the second time and unsuccessful.Pt states that his bladder felt full. A; bladder scanned for 388 cc R: In and out cathter for 450 cc.Pt cathed by Toni Amend NT.Pt tolerated wbb

## 2012-03-19 NOTE — Progress Notes (Signed)
Patient ID: James Bell, male   DOB: 10-26-1930, 78 y.o.   MRN: [id number] Subjective/Complaints:  HPI: James Bell is a 78 y.o. right-handed male with history of diabetes mellitus and hyperlipidemia. 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 MRI revealed moderate size acute infarct affecting the left posterior putamen and left periventricular white matter. MRA of the head was unremarkable. Carotid Dopplers with no ICA stenosis. Echocardiogram with ejection fraction of 65% and grade 1 diastolic dysfunction. Neurology consulted placed on Plavix therapy. Patient was not a TPA candidate. Subcutaneous Lovenox added for DVT prophylaxis.  Slept well last noc except required I/O cath Review of Systems  Musculoskeletal: Positive for joint pain.       R shoulder  Neurological: Positive for speech change.   Objective: Vital Signs: Blood pressure 109/67, pulse 69, temperature 97.8 F (36.6 C), temperature source Oral, resp. rate 17, height 5\' 9"  (1.753 m), weight 77.2 kg (170 lb 3.1 oz), SpO2 98.00%. No results found. Results for orders placed during the hospital encounter of 03/13/12 (from the past 72 hour(s))  GLUCOSE, CAPILLARY     Status: Abnormal   Collection Time   03/16/12 12:19 PM      Component Value Range Comment   Glucose-Capillary 215 (*) 70 - 99 mg/dL    Comment 1 Notify RN     GLUCOSE, CAPILLARY     Status: Abnormal   Collection Time   03/16/12  4:36 PM      Component Value Range Comment   Glucose-Capillary 189 (*) 70 - 99 mg/dL    Comment 1 Notify RN     GLUCOSE, CAPILLARY     Status: Abnormal   Collection Time   03/16/12  9:09 PM      Component Value Range Comment   Glucose-Capillary 300 (*) 70 - 99 mg/dL    Comment 1 Notify RN     GLUCOSE, CAPILLARY     Status: Abnormal   Collection Time   03/17/12  7:29 AM      Component Value Range Comment   Glucose-Capillary 210 (*) 70 - 99 mg/dL    Comment 1 Notify RN       GLUCOSE, CAPILLARY     Status: Abnormal   Collection Time   03/17/12 11:54 AM      Component Value Range Comment   Glucose-Capillary 199 (*) 70 - 99 mg/dL    Comment 1 Notify RN     GLUCOSE, CAPILLARY     Status: Abnormal   Collection Time   03/17/12  4:35 PM      Component Value Range Comment   Glucose-Capillary 258 (*) 70 - 99 mg/dL    Comment 1 Notify RN     GLUCOSE, CAPILLARY     Status: Abnormal   Collection Time   03/17/12  9:05 PM      Component Value Range Comment   Glucose-Capillary 201 (*) 70 - 99 mg/dL   GLUCOSE, CAPILLARY     Status: Abnormal   Collection Time   03/18/12  7:20 AM      Component Value Range Comment   Glucose-Capillary 196 (*) 70 - 99 mg/dL    Comment 1 Notify RN     GLUCOSE, CAPILLARY     Status: Abnormal   Collection Time   03/18/12 11:23 AM      Component Value Range Comment   Glucose-Capillary 201 (*) 70 - 99 mg/dL    Comment 1 Notify RN  GLUCOSE, CAPILLARY     Status: Abnormal   Collection Time   03/18/12  4:32 PM      Component Value Range Comment   Glucose-Capillary 185 (*) 70 - 99 mg/dL   GLUCOSE, CAPILLARY     Status: Abnormal   Collection Time   03/18/12  9:37 PM      Component Value Range Comment   Glucose-Capillary 175 (*) 70 - 99 mg/dL   GLUCOSE, CAPILLARY     Status: Abnormal   Collection Time   03/19/12  7:08 AM      Component Value Range Comment   Glucose-Capillary 154 (*) 70 - 99 mg/dL    Comment 1 Notify RN        HEENT: normal Cardio: RRR Resp: CTA B/L GI: BS positive Extremity:  No Edema Skin:   Intact Neuro: Alert/Oriented, Cranial Nerve Abnormalities R central VII, Normal Sensory, Abnormal Motor 1/5 R shoulder protraction and Dysarthric,  RLE 2-/5 hipextension, knee ext synergy, 0/5 R ankle Musc/Skel:  Other no shoulder pain, L snuff box, L raial styloid , L 1st CMC tenderness, Limited ROM in thumb flex ext   Assessment/Plan: 1. Functional deficits secondary to R putamen infarct with L HP, dysarthria and dysphagia  which require 3+ hours per day of interdisciplinary therapy in a comprehensive inpatient rehab setting. Physiatrist is providing close team supervision and 24 hour management of active medical problems listed below. Physiatrist and rehab team continue to assess barriers to discharge/monitor patient progress toward functional and medical goals. FIM: FIM - Bathing Bathing Steps Patient Completed: Chest;Right Arm;Left Arm;Abdomen;Front perineal area;Buttocks;Right upper leg;Left upper leg;Right lower leg (including foot);Left lower leg (including foot) Bathing: 4: Min-Patient completes 8-9 44f 10 parts or 75+ percent  FIM - Upper Body Dressing/Undressing Upper body dressing/undressing steps patient completed: Thread/unthread right sleeve of pullover shirt/dresss;Thread/unthread left sleeve of pullover shirt/dress;Put head through opening of pull over shirt/dress;Pull shirt over trunk Upper body dressing/undressing: 4: Min-Patient completed 75 plus % of tasks FIM - Lower Body Dressing/Undressing Lower body dressing/undressing steps patient completed: Thread/unthread right underwear leg;Thread/unthread left underwear leg;Pull underwear up/down;Thread/unthread right pants leg;Thread/unthread left pants leg;Pull pants up/down;Don/Doff right shoe;Don/Doff left shoe;Fasten/unfasten right shoe;Fasten/unfasten left shoe Lower body dressing/undressing: 4: Min-Patient completed 75 plus % of tasks  FIM - Toileting Toileting steps completed by patient: Adjust clothing prior to toileting;Performs perineal hygiene;Adjust clothing after toileting Toileting Assistive Devices: Grab bar or rail for support Toileting: 2: Max-Patient completed 1 of 3 steps  FIM - Diplomatic Services operational officer Devices: Elevated toilet seat;Grab bars Toilet Transfers: 2-To toilet/BSC: Max A (lift and lower assist);2-From toilet/BSC: Max A (lift and lower assist)  FIM - Press photographer Assistive  Devices: Arm rests Bed/Chair Transfer: 3: Supine > Sit: Mod A (lifting assist/Pt. 50-74%/lift 2 legs;4: Sit > Supine: Min A (steadying pt. > 75%/lift 1 leg);2: Bed > Chair or W/C: Max A (lift and lower assist);2: Chair or W/C > Bed: Max A (lift and lower assist)  FIM - Locomotion: Wheelchair Distance: 150 Locomotion: Wheelchair: 3: Travels 150 ft or more: maneuvers on rugs and over door sills with moderate assistance  (Pt: 50 - 74%) FIM - Locomotion: Ambulation Ambulation/Gait Assistance: 1: +2 Total assist Locomotion: Ambulation: 0: Activity did not occur  Comprehension Comprehension Mode: Auditory Comprehension: 5-Follows basic conversation/direction: With no assist  Expression Expression Mode: Verbal Expression: 3-Expresses basic 50 - 74% of the time/requires cueing 25 - 50% of the time. Needs to repeat parts of sentences.  Social  Interaction Social Interaction: 4-Interacts appropriately 75 - 89% of the time - Needs redirection for appropriate language or to initiate interaction.  Problem Solving Problem Solving: 3-Solves basic 50 - 74% of the time/requires cueing 25 - 49% of the time  Memory Memory: 4-Recognizes or recalls 75 - 89% of the time/requires cueing 10 - 24% of the time  Medical Problem List and Plan:  1. 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 DVT Prophylaxis/Anticoagulation: Subcutaneous Lovenox. Monitor platelet counts and any signs of bleeding  3. Dysphagia. Mechanical soft with nectar liquids. Monitor for any signs of aspiration. Speech therapy to followup  4. Neuropsych: This patient is capable of making decisions on his/her own behalf.  5. Diabetes mellitus.Uncontrolled Hemoglobin A1c 6.3.Increase Lantus insulin 35 units each bedtime. Patient on 45 units Lantus prior to admission as well as janumet 50-100 twice a day. Check blood sugars a.c. and at bedtime  6. Hypertension. Lisinopril 10 mg daily. Monitor with  increased mobility  7. Hyperlipidemia. Lipitor  8. Restless leg syndrome. Continue Mirapex 1 mg daily  9. Obstructive sleep apnea. CPAP. Patient has been using a CPAP machine for approximately 2 years. He is using his face mask from home. 10 urinary retention add flomax  LOS (Days) 6 A FACE TO FACE EVALUATION WAS PERFORMED  Oryn Casanova E 03/19/2012, 8:33 AM

## 2012-03-19 NOTE — Progress Notes (Signed)
Occupational Therapy Session Note  Patient Details  Name: James Bell MRN: [id number] Date of Birth: 11-02-1930  Today's Date: 03/19/2012 Time: 0730-0830 Time Calculation (min): 60 min  Short Term Goals: Week 1:  OT Short Term Goal 1 (Week 1): Patient will stand at sink using right upper extremity as support, with no more than min assist during bathing and dressing tasks. OT Short Term Goal 2 (Week 1): Patient will transfer to toilet with min assist OT Short Term Goal 3 (Week 1): Patient will dress upper body with mod assist OT Short Term Goal 4 (Week 1): Patient will dress lower body with mod assist OT Short Term Goal 5 (Week 1): Patient will complete toileting with max assist  Skilled Therapeutic Interventions/Progress Updates:  Had planned to take shower this AM. Once in the shower therapist discovered that water would not turn on. Patient completed ADL at sink instead this AM. During transfers patient required vc's for hand placement, safety, and technique. Patient leans considerably to the right when transferring and requires physical assistance for balance. Patient re-educated on hemi-dressing techniques to increase functional performance during dressing tasks. Patient requires increased time during dressing.  P/ROM; RUE; 10 reps; 1 set; all ranges as tolerated. Pain noted in right shoulder during shoulder flexion and extension. Patient able to achieve active right shoulder elevation, 1/5 (trace) bicep flexion and extension against gravity. Functional transfer to mat table from w/c; squat pivot towards left side; vc's for hand placement and technique; Mod A. Seated in EOM, patient performed weightbearing into RUE while reaching forward with LUE for cones in various planes and distances to increase dynamic sitting balance and to provide tactile input and proprioception and awareness of RUE. Patient challenged when reaching forward as well as reaching to the right. Patient required  physical assistance for balance when reaching to right side. Dynamic sitting balance Poor+ towards left side and Poor- towards right side and middle.   Therapy Documentation Precautions:  Precautions Precautions: Fall Restrictions Weight Bearing Restrictions: No Pain: Reports of pain in RUE shoulder with P/ROM shoulder flexion and extension. Nursing aware. See FIM for current functional status  Therapy/Group: Individual Therapy  Rosemaria Inabinet, Charisse March 03/19/2012, 11:00 AM

## 2012-03-19 NOTE — Progress Notes (Signed)
Speech Language Pathology Daily Session Note [place] [place]  Patient Details  Name: James Bell MRN: [id number] Date of Birth: 05-28-1931  Today's Date: 03/19/2012 Time: 1130-1145 Time Calculation (min): 15 min  Short Term Goals: Week 1: SLP Short Term Goal 1 (Week 1): Patient will verbally express wants and needs with supervision verbal cues SLP Short Term Goal 1 - Progress (Week 1): Progressing toward goal SLP Short Term Goal 2 (Week 1): Patient will utilize a slow pace and increased volume with verbal expression at the sentence level with supervision level semantic cues SLP Short Term Goal 2 - Progress (Week 1): Progressing toward goal SLP Short Term Goal 3 (Week 1): Patient will attend to right upper extremity with supervision semantic cues SLP Short Term Goal 3 - Progress (Week 1): Progressing toward goal SLP Short Term Goal 4 (Week 1): Patient will request help as needed while completing BADLS with minimal assist semantic cues SLP Short Term Goal 4 - Progress (Week 1): Progressing toward goal SLP Short Term Goal 5 (Week 1): Patient will utilize diaphragmatic breath support/control strategy with sentence level expression with minimal verbal guidance/cues. SLP Short Term Goal 5 - Progress (Week 1): Progressing toward goal  Skilled Therapeutic Interventions: Pt seen in [place] with focus on utilization of swallowing compensatory strategies. Pt without overt s/s of aspiration and required verbal cueing for functional problem solving to utilize RUE as a stabilizer to open containers. Pt with decreased speech intelligibility but was ~90% intelligible with verbal cueing for utilization of strategies.    FIM:  Comprehension Comprehension Mode: Auditory Comprehension: 5-Follows basic conversation/direction: With extra time/assistive device Expression Expression Mode: Verbal Expression: 4-Expresses basic 75 - 89% of the time/requires cueing 10 - 24% of the time. Needs helper to  occlude trach/needs to repeat words. Social Interaction Social Interaction: 4-Interacts appropriately 75 - 89% of the time - Needs redirection for appropriate language or to initiate interaction. Problem Solving Problem Solving: 4-Solves basic 75 - 89% of the time/requires cueing 10 - 24% of the time Memory Memory: 4-Recognizes or recalls 75 - 89% of the time/requires cueing 10 - 24% of the time FIM - Eating Eating Activity: 5: Supervision/cues  Pain Pain Assessment Pain Assessment: No/denies pain Pain Score:   2 Faces Pain Scale: Hurts a little bit  Therapy/Group: Dysphagia Group  Ananias Kolander 03/19/2012, 4:50 PM

## 2012-03-19 NOTE — Progress Notes (Signed)
Physical Therapy Session Note  Patient Details  Name: James James MRN: [id number] Date of Birth: 1931/09/12  Today's Date: 03/19/2012 Time: 0900-1000 Time Calculation (min): 60 min  Short Term Goals: Week 1:  PT Short Term Goal 1 (Week 1): Transfer to the left, squat pivot with min@ consistently PT Short Term Goal 2 (Week 1): Supine to sit from the left side with supervision PT Short Term Goal 3 (Week 1): W/c propulsion x 50' with L extremities min@ in controlled environment PT Short Term Goal 4 (Week 1): Gait with appropriate assistive device x 25' with max@.  Therapy Documentation Precautions:  Precautions Precautions: Fall Restrictions Weight Bearing Restrictions: No Pain: Pain Assessment Pain Assessment: No/denies pain Other Treatments: Treatments Therapeutic Activity: Performed w/c parts management and sequence of set up to prepare for w/c <> mat transfers; gave patient visual demonstration and had patient give repeat demonstration with max verbal and tactile cues and performed w/c > mat to R side squat pivot with mod-max A to maintain elevation of buttocks during full pivot and to prevent R lateral LOB; patient demonstrating some impulsivity and impaired sequencing.  Transfers mat > w/c to L with mod A but still required verbal cues for hand placement and safe sequence.  W/c > bed to R with squat pivot x 2 reps with max A secondary to R lateral LOB, 2nd repetition patient demonstrated improved lateral trunk control and increase rotation for pivot.  NMR:  RLE and trunk during standing with bilat UE support on EVA walker or on stair rail and RUE around therapist's shoulders during R lateral weight shifts, activation of RLE extensors during LLE lateral, forward, retro stepping on ground and up/down 4" step.  Trunk control training in sitting with R foot and RUE supported for WB and facilitation of activation of extensors but LLE off floor during LUE reaching out of BOS in various  directions and across midline for lateral, forward weight shifting and training of head and trunk righting reactions with mod A when reaching forward and to R  See FIM for current functional status  Therapy/Group: Individual Therapy  Edman Circle [facility] 03/19/2012, 11:47 AM

## 2012-03-19 NOTE — Progress Notes (Signed)
Speech Language Pathology Daily Session Note  Patient Details  Name: James Bell MRN: [id number] Date of Birth: [birth year]  Today's Date: 03/19/2012 Time: 1300-1330 Time Calculation (min): 30 min  Short Term Goals: Week 1: SLP Short Term Goal 1 (Week 1): Patient will verbally express wants and needs with supervision verbal cues SLP Short Term Goal 1 - Progress (Week 1): Progressing toward goal SLP Short Term Goal 2 (Week 1): Patient will utilize a slow pace and increased volume with verbal expression at the sentence level with supervision level semantic cues SLP Short Term Goal 2 - Progress (Week 1): Progressing toward goal SLP Short Term Goal 3 (Week 1): Patient will attend to right upper extremity with supervision semantic cues SLP Short Term Goal 3 - Progress (Week 1): Progressing toward goal SLP Short Term Goal 4 (Week 1): Patient will request help as needed while completing BADLS with minimal assist semantic cues SLP Short Term Goal 4 - Progress (Week 1): Progressing toward goal SLP Short Term Goal 5 (Week 1): Patient will utilize diaphragmatic breath support/control strategy with sentence level expression with minimal verbal guidance/cues. SLP Short Term Goal 5 - Progress (Week 1): Progressing toward goal  Skilled Therapeutic Interventions: Afternoon ST session concentrated on facilitation of coordination of speech respiration and intelligibility in connected speech.  Pt. required mild-moderate verbal cueing for deep inhalation and increased volume prior to verbalizing during reading activity (reading paragraphs aloud).  Overall, his volume and endurance was increased today.  Pt. needs occasional verbal reminders to over articulate his sounds.  Oral cavity cleaned at onset of session followed by sips water for the water protocol.  Decreased s/s aspiration observed today as pt. spontaneously performing a left head tilt.  Repeat MBS is schedued for tomorrow for possible upgrade to  thin liquids.   FIM:  Comprehension Comprehension Mode: Auditory Comprehension: 5-Follows basic conversation/direction: With extra time/assistive device Expression Expression Mode: Verbal Expression: 3-Expresses basic 50 - 74% of the time/requires cueing 25 - 50% of the time. Needs to repeat parts of sentences. Social Interaction Social Interaction: 4-Interacts appropriately 75 - 89% of the time - Needs redirection for appropriate language or to initiate interaction. Problem Solving Problem Solving: 3-Solves basic 50 - 74% of the time/requires cueing 25 - 49% of the time Memory Memory: 4-Recognizes or recalls 75 - 89% of the time/requires cueing 10 - 24% of the time  Pain Pain Assessment Pain Assessment: No/denies pain Pain Score:   2 Faces Pain Scale: Hurts a little bit  Therapy/Group: Individual Therapy  Royce Macadamia 03/19/2012, 3:06 PM

## 2012-03-19 NOTE — Evaluation (Signed)
Recreational Therapy Assessment and Plan  Patient Details  Name: James Bell MRN: [id number] Date of Birth: [birth year] Today's Date: 03/19/2012  Rehab Potential: Good ELOS: 2.5 weeks   Assessment Clinical Impression:Problem List:  Patient Active Problem List   Diagnosis   .  CVA (cerebrovascular accident)   .  Right hemiparesis   .  DM type 2 (diabetes mellitus, type 2)   .  OSA (obstructive sleep apnea)   .  Elevated serum creatinine   .  CVA (cerebral vascular accident)    Past Medical History:  Past Medical History   Diagnosis  Date   .  Hypercholesteremia    .  Restless leg syndrome    .  Sleep apnea    .  Type II diabetes mellitus    .  H/O hiatal hernia    .  GERD (gastroesophageal reflux disease)    .  Headache  03/11/12     "up until ~ 2 yr ago I had them daily"   .  Stroke  03/07/12     slurred speech; right hemplegia   .  Basal cell carcinoma of nose  2002     "bridge of nose"   .  History of kidney problems  12/01/10     "lacerated kidney"    Past Surgical History:  Past Surgical History   Procedure  Date   .  Prostate surgery  2010     Laser   .  Tonsillectomy and adenoidectomy  ~ 1945   .  Hemorrhoid surgery  1991   .  Cataract extraction w/ intraocular lens implant  11//2012 - 08/2011   .  Cardiac catheterization  2004     "normal"   .  Inguinal hernia repair  1981; 2002     left; "repaired left"   .  Skin cancer excision  2002     bridge of nose    Assessment & Plan  Clinical Impression: Patient is a 78 y.o. year old male with recent admission to the hospital on 03/11/12 with L posterior putamen and L perivetricular white matter CVA. Patient transferred to CIR on 03/13/2012 .   Pt presents with decreased activity tolerance, decreased functional mobility, decreased balance, right sided weakness, decreased cognition limiting pt's independence with leisure/community pursuits. Leisure History/Participation Premorbid leisure interest/current  participation: Garment/textile technologist - Press photographer - Grocery store;Community - Travel (Comment) Other Leisure Interests: Television;Reading;Computer;Cooking/Baking Leisure Participation Style: Alone;With Family/Friends Awareness of Community Resources: Excellent Psychosocial / Spiritual Stress Management: Good Patient agreeable to Pet Therapy: Yes Social interaction - Mood/Behavior: Cooperative Firefighter Appropriate for Education?: Yes Patient Agreeable to Outing?: Yes Recreational Therapy Orientation Orientation -Reviewed with patient: Available activity resources Strengths/Weaknesses Patient Strengths/Abilities: Willingness to participate;Active premorbidly Patient weaknesses: Physical limitations  Plan Rec Therapy Plan Is patient appropriate for Therapeutic Recreation?: Yes Rehab Potential: Good Treatment times per week: Min 1 time per week Estimated Length of Stay: 2.5 weeks TR Treatment/Interventions: Adaptive equipment instruction;Community reintegration;1:1 session;Balance/vestibular training;Cognitive remediation/compensation;Functional mobility training;Patient/family education;Recreation/leisure participation;Therapeutic activities;Therapeutic exercise;UE/LE Coordination activities  Recommendations for other services: None  Discharge Criteria: Patient will be discharged from TR if patient refuses treatment 3 consecutive times without medical reason.  If treatment goals not met, if there is a change in medical status, if patient makes no progress towards goals or if patient is discharged from hospital.  The above assessment, treatment plan, treatment alternatives and goals were discussed and mutually agreed upon: by patient  [name] 03/19/2012, 4:16 PM

## 2012-03-20 ENCOUNTER — Other Ambulatory Visit (HOSPITAL_COMMUNITY): Payer: No Typology Code available for payment source

## 2012-03-20 LAB — GLUCOSE, CAPILLARY
Glucose-Capillary: 212 mg/dL — ABNORMAL HIGH (ref 70–99)
Glucose-Capillary: 224 mg/dL — ABNORMAL HIGH (ref 70–99)
Glucose-Capillary: 152 mg/dL — ABNORMAL HIGH (ref 70–99)
Glucose-Capillary: 168 mg/dL — ABNORMAL HIGH (ref 70–99)

## 2012-03-20 MED ORDER — ENSURE PUDDING PO PUDG
1.0000 | Freq: Three times a day (TID) | ORAL | Status: DC
  Administered 2012-03-20 – 2012-03-29 (×12): 1 via ORAL

## 2012-03-20 MED ORDER — INSULIN GLARGINE 100 UNIT/ML ~~LOC~~ SOLN
45.0000 [IU] | Freq: Every day | SUBCUTANEOUS | Status: DC
  Administered 2012-03-20 – 2012-03-21 (×2): 45 [IU] via SUBCUTANEOUS

## 2012-03-20 NOTE — Progress Notes (Signed)
Inpatient Diabetes Program Recommendations  AACE/ADA: New Consensus Statement on Inpatient Glycemic Control (2009)  Target Ranges:  Prepandial:   less than 140 mg/dL      Peak postprandial:   less than 180 mg/dL (1-2 hours)      Critically ill patients:  140 - 180 mg/dL   Reason for Visit: Sustained hyperglycemia  Inpatient Diabetes Program Recommendations Correction (SSI): Please increase to resistant correction tidwc and add HS scale Oral Agents: Pt takes Januvia 50 mg at home.  Hospital formulary uses Tradjenta . Equivalent dosage of Tradjenta to Januvia would be 10 mg/day .  Please consider ordering this DPP4 inhibiitor  Note: Thank you, Lenor Coffin, RN, CNS, Diabetes Coordinator [phone])

## 2012-03-20 NOTE — Progress Notes (Signed)
Occupational Therapy Session Note  Patient Details  Name: James Bell MRN: [id number] Date of Birth: 04/19/1931  Today's Date: 03/20/2012 Time: 0700-0800 Time Calculation (min): 60 min  Short Term Goals: Week 1:  OT Short Term Goal 1 (Week 1): Patient will stand at sink using right upper extremity as support, with no more than min assist during bathing and dressing tasks. OT Short Term Goal 2 (Week 1): Patient will transfer to toilet with min assist OT Short Term Goal 3 (Week 1): Patient will dress upper body with mod assist OT Short Term Goal 4 (Week 1): Patient will dress lower body with mod assist OT Short Term Goal 5 (Week 1): Patient will complete toileting with max assist  Skilled Therapeutic Interventions/Progress Updates:  Patient reported feeling sick and nauseated. Once pt transferred into shower he had a small emesis. Nursing made aware. Patient required more physical assistance during shower and transfers secondary to not feeling well.  Patient still requires vc's for safety during transfers. Pt needed min vc's for hemi-dressing technique. Patient requested to lay down after dressing. Dr. Present once patient in bed and patient had a large emesis.   Therapy Documentation Precautions:  Precautions Precautions: Fall Restrictions Weight Bearing Restrictions: No Pain: No reports of pain. See FIM for current functional status  Therapy/Group: Individual Therapy  Stephon Weathers, Charisse March 03/20/2012, 11:01 AM

## 2012-03-20 NOTE — Progress Notes (Signed)
Patient last voided at 2000 on 03/19/12.  Patient was up to bathroom and unable to void this morning.  The patient states that he may be able to void if he is allowed time to relax.  Patient wants to try to void again before being in and out cathed.  Patient bladder scan reveals 219 mL.  Deatra Ina, PA aware of patient's last void and patient's request to wait to try to void later in the morning.  Deatra Ina, PA agrees to allow the patient to wait and try to void at a later time.  Will continue to monitor.

## 2012-03-20 NOTE — Progress Notes (Signed)
Patient ID: James Bell, male   DOB: [birth year], 78 y.o.   MRN: [id number] Subjective/Complaints:  HPI: James Bell is a 78 y.o. right-handed male with history of diabetes mellitus and hyperlipidemia. 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 MRI revealed moderate size acute infarct affecting the left posterior putamen and left periventricular white matter. MRA of the head was unremarkable. Carotid Dopplers with no ICA stenosis. Echocardiogram with ejection fraction of 65% and grade 1 diastolic dysfunction. Neurology consulted placed on Plavix therapy. Patient was not a TPA candidate. Subcutaneous Lovenox added for DVT prophylaxis.  PVR improved bu t nauseated and vomiting bilious, systolic BP Low Review of Systems  Musculoskeletal: Positive for joint pain.       R shoulder  Neurological: Positive for speech change.   Objective: Vital Signs: Blood pressure 90/56, pulse 80, temperature 97.5 F (36.4 C), temperature source Oral, resp. rate 18, height 5\' 9"  (1.753 m), weight 78.2 kg (172 lb 6.4 oz), SpO2 96.00%. No results found. Results for orders placed during the hospital encounter of 03/13/12 (from the past 72 hour(s))  GLUCOSE, CAPILLARY     Status: Abnormal   Collection Time   03/17/12 11:54 AM      Component Value Range Comment   Glucose-Capillary 199 (*) 70 - 99 mg/dL    Comment 1 Notify RN     GLUCOSE, CAPILLARY     Status: Abnormal   Collection Time   03/17/12  4:35 PM      Component Value Range Comment   Glucose-Capillary 258 (*) 70 - 99 mg/dL    Comment 1 Notify RN     GLUCOSE, CAPILLARY     Status: Abnormal   Collection Time   03/17/12  9:05 PM      Component Value Range Comment   Glucose-Capillary 201 (*) 70 - 99 mg/dL   GLUCOSE, CAPILLARY     Status: Abnormal   Collection Time   03/18/12  7:20 AM      Component Value Range Comment   Glucose-Capillary 196 (*) 70 - 99 mg/dL    Comment 1 Notify RN     GLUCOSE,  CAPILLARY     Status: Abnormal   Collection Time   03/18/12 11:23 AM      Component Value Range Comment   Glucose-Capillary 201 (*) 70 - 99 mg/dL    Comment 1 Notify RN     GLUCOSE, CAPILLARY     Status: Abnormal   Collection Time   03/18/12  4:32 PM      Component Value Range Comment   Glucose-Capillary 185 (*) 70 - 99 mg/dL   GLUCOSE, CAPILLARY     Status: Abnormal   Collection Time   03/18/12  9:37 PM      Component Value Range Comment   Glucose-Capillary 175 (*) 70 - 99 mg/dL   GLUCOSE, CAPILLARY     Status: Abnormal   Collection Time   03/19/12  7:08 AM      Component Value Range Comment   Glucose-Capillary 154 (*) 70 - 99 mg/dL    Comment 1 Notify RN     GLUCOSE, CAPILLARY     Status: Abnormal   Collection Time   03/19/12 11:19 AM      Component Value Range Comment   Glucose-Capillary 249 (*) 70 - 99 mg/dL    Comment 1 Notify RN     GLUCOSE, CAPILLARY     Status: Abnormal   Collection Time  03/19/12  4:42 PM      Component Value Range Comment   Glucose-Capillary 201 (*) 70 - 99 mg/dL   GLUCOSE, CAPILLARY     Status: Abnormal   Collection Time   03/19/12  9:38 PM      Component Value Range Comment   Glucose-Capillary 241 (*) 70 - 99 mg/dL   GLUCOSE, CAPILLARY     Status: Abnormal   Collection Time   03/20/12  7:29 AM      Component Value Range Comment   Glucose-Capillary 212 (*) 70 - 99 mg/dL    Comment 1 Notify RN        HEENT: normal Cardio: RRR Resp: CTA B/L GI: BS positive Extremity:  No Edema Skin:   Intact Neuro: Alert/Oriented, Cranial Nerve Abnormalities R central VII, Normal Sensory, Abnormal Motor 1/5 R shoulder protraction and Dysarthric,  RLE 2-/5 hipextension, knee ext synergy, 0/5 R ankle Musc/Skel:  Other no shoulder pain, L snuff box, L raial styloid , L 1st CMC tenderness, Limited ROM in thumb flex ext   Assessment/Plan: 1. Functional deficits secondary to R putamen infarct with L HP, dysarthria and dysphagia which require 3+ hours per day of  interdisciplinary therapy in a comprehensive inpatient rehab setting. Physiatrist is providing close team supervision and 24 hour management of active medical problems listed below. Physiatrist and rehab team continue to assess barriers to discharge/monitor patient progress toward functional and medical goals. FIM: FIM - Bathing Bathing Steps Patient Completed: Chest;Right Arm;Left Arm;Abdomen;Front perineal area;Buttocks;Right upper leg;Left upper leg;Right lower leg (including foot);Left lower leg (including foot) Bathing: 4: Min-Patient completes 8-9 103f 10 parts or 75+ percent  FIM - Upper Body Dressing/Undressing Upper body dressing/undressing steps patient completed: Thread/unthread left sleeve of pullover shirt/dress;Put head through opening of pull over shirt/dress;Pull shirt over trunk Upper body dressing/undressing: 4: Min-Patient completed 75 plus % of tasks FIM - Lower Body Dressing/Undressing Lower body dressing/undressing steps patient completed: Thread/unthread right underwear leg;Thread/unthread left underwear leg;Pull underwear up/down;Thread/unthread left pants leg;Thread/unthread right pants leg;Pull pants up/down;Don/Doff right shoe;Don/Doff left shoe;Fasten/unfasten left shoe;Fasten/unfasten right shoe Lower body dressing/undressing: 4: Min-Patient completed 75 plus % of tasks  FIM - Toileting Toileting steps completed by patient: Adjust clothing prior to toileting;Performs perineal hygiene;Adjust clothing after toileting Toileting Assistive Devices: Grab bar or rail for support Toileting: 2: Max-Patient completed 1 of 3 steps  FIM - Diplomatic Services operational officer Devices: Elevated toilet seat;Grab bars Toilet Transfers: 2-To toilet/BSC: Max A (lift and lower assist);2-From toilet/BSC: Max A (lift and lower assist)  FIM - Press photographer Assistive Devices: Arm rests Bed/Chair Transfer: 4: Sit > Supine: Min A (steadying pt. > 75%/lift 1  leg);2: Bed > Chair or W/C: Max A (lift and lower assist);2: Chair or W/C > Bed: Max A (lift and lower assist)  FIM - Locomotion: Wheelchair Distance: 150 Locomotion: Wheelchair: 1: Total Assistance/staff pushes wheelchair (Pt<25%) FIM - Locomotion: Ambulation Ambulation/Gait Assistance: 1: +2 Total assist Locomotion: Ambulation: 0: Activity did not occur  Comprehension Comprehension Mode: Auditory Comprehension: 5-Follows basic conversation/direction: With extra time/assistive device  Expression Expression Mode: Verbal Expression: 3-Expresses basic 50 - 74% of the time/requires cueing 25 - 50% of the time. Needs to repeat parts of sentences.  Social Interaction Social Interaction: 4-Interacts appropriately 75 - 89% of the time - Needs redirection for appropriate language or to initiate interaction.  Problem Solving Problem Solving: 4-Solves basic 75 - 89% of the time/requires cueing 10 - 24% of the time  Memory  Memory: 4-Recognizes or recalls 75 - 89% of the time/requires cueing 10 - 24% of the time  Medical Problem List and Plan:  1. 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 DVT Prophylaxis/Anticoagulation: Subcutaneous Lovenox. Monitor platelet counts and any signs of bleeding  3. Dysphagia. Mechanical soft with nectar liquids. Monitor for any signs of aspiration. Speech therapy to followup  4. Neuropsych: This patient is capable of making decisions on his/her own behalf.  5. Diabetes mellitus.Uncontrolled Hemoglobin A1c 6.3.Increase Lantus insulin 45 units each bedtime. Patient on 45 units Lantus prior to admission as well as janumet 50-100 twice a day. Check blood sugars a.c. and at bedtime  6. Hypertension. Lisinopril 10 mg daily. Monitor with increased mobility  7. Hyperlipidemia. Lipitor  8. Restless leg syndrome. Continue Mirapex 1 mg daily  9. Obstructive sleep apnea. CPAP. Patient has been using a CPAP machine for approximately  2 years. He is using his face mask from home. 10 urinary retention  flomax added lower PVR but olw BP +/- nausea  LOS (Days) 7 A FACE TO FACE EVALUATION WAS PERFORMED  Sashay Felling E 03/20/2012, 7:49 AM

## 2012-03-20 NOTE — Progress Notes (Signed)
Social Work Patient ID: James Bell, male   DOB: November 28, 1930, 78 y.o.   MRN: [id number] Met with pt, daughter and niece to discuss team conference goals-supervision/min level and discharge 7/17. All aware pt will require someone with him at all times.  At this time pt does not have 24 hour care.  Discussed Options ie, hired assistance and short term NHP.  Daughter needed letter for POA to become active, gave letter to daughter She plans to take to courthouse today.  Will discuss with family the best discharge option.  Pt feeling better now than he was  This am.  Hopes MBS is re-scheduled soon so can increase diet if passes.  Encouraged both to attend therapies with pt to see  How he is doing and the progress he has made.  Pt is agreeable to plan and will discuss with family further the discharge plan. Provided support and will work on plan.

## 2012-03-20 NOTE — Progress Notes (Signed)
SLP Cancellation Note  Treatment cancelled today due to medical issues with patient which prohibited therapy.  Pt.'s MBS scheduled for today was cancelled due to pt. nauseated this am.  SLP attempted to see for bedside treatment (during scheduled MBS time) but he politely declined.  Royce Macadamia 03/20/2012, 9:18 AM

## 2012-03-20 NOTE — Progress Notes (Signed)
Speech Language Pathology Weekly Progress Note  Patient Details  Name: James Bell MRN: [id number] Date of Birth: 17-Jun-1931  Today's Date: 03/20/2012 Time: 7829-5621 Time Calculation (min): 30 min  Short Term Goals: Week 1: SLP Short Term Goal 1 (Week 1): Patient will verbally express wants and needs with supervision verbal cues SLP Short Term Goal 1 - Progress (Week 1): Met SLP Short Term Goal 2 (Week 1): Patient will utilize a slow pace and increased volume with verbal expression at the sentence level with supervision level semantic cues SLP Short Term Goal 2 - Progress (Week 1): Progressing toward goal SLP Short Term Goal 3 (Week 1): Patient will attend to right upper extremity with supervision semantic cues SLP Short Term Goal 3 - Progress (Week 1): Progressing toward goal SLP Short Term Goal 4 (Week 1): Patient will request help as needed while completing BADLS with minimal assist semantic cues SLP Short Term Goal 4 - Progress (Week 1): Partly met SLP Short Term Goal 5 (Week 1): Patient will utilize diaphragmatic breath support/control strategy with sentence level expression with minimal verbal guidance/cues. SLP Short Term Goal 5 - Progress (Week 1): Progressing toward goal Week 2: SLP Short Term Goal 1 (Week 2): Pt. will consume regular diet with thin liquids with minimal s/s aspiration demonstrating swallow precautions with supervision level assist  SLP Short Term Goal 2 (Week 2): Pt. will utilize diaphragmatic breathing in conversation for increased vocal control and increased words per breath with min verbal assist. SLP Short Term Goal 3 (Week 2): Pt. will attend to upper right extremity during activities and in his environment with supervision verbal/visual cues. SLP Short Term Goal 4 (Week 2): Pt. will demonstrate appropriate executive functioning abilities via diagnostic assessment given supervision cues.  Weekly Progress Updates: Treatment this week has concentrated on  phonatory and respiratory coordination to facilitate increased vocal intensity and number of words per breath and dysphagia.  Pt. currently appears safe and efficient on a Dys 3 diet and nectar thick liquids.  The water protocol was implemented with observation of less frequent s/s aspiration when pt. utilizes a left head tilt.  MBS scheduled for 7/3 was canceled due to pt. nauseated and rescheduled for 7/4 for possible upgrade to regular diet texture and thin liquids.  His affect appears flat as well as decreased endurance requiring cues for participation and motivation.  Assessment of cognitive abilited has been limited due to pt.'s decreased endurance and medical issues preventing pt. participation.  Recommend continued diagnostic evaluation of higher level cognitive abilites.      Daily Session FIM:  FIM - Eating Eating Activity: 5: Supervision/cues;5: Set-up assist for open containers General    Pain Pain Assessment Pain Assessment: No/denies pain  Therapy/Group: Individual Therapy  Royce Macadamia 03/20/2012, 5:45 PM

## 2012-03-20 NOTE — Patient Care Conference (Signed)
Inpatient RehabilitationTeam Conference Note Date: 03/20/2012   Time: 11:15am    Patient Name: James Bell      Medical Record Number: [id number]  Date of Birth: [birth year] Sex: Male         Room/Bed: 4036/4036-01 Payor Info: Payor: MEDICARE  Plan: MEDICARE PART A AND B  Product Type: *No Product type*     Admitting Diagnosis: L CVA  Admit Date/Time:  03/13/2012  2:20 PM Admission Comments: No comment available   Primary Diagnosis:  CVA (cerebral vascular accident) Principal Problem: CVA (cerebral vascular accident)  Patient Active Problem List   Diagnosis Date Noted  . CVA (cerebral vascular accident) 03/13/2012  . CVA (cerebrovascular accident) 03/11/2012  . Right hemiparesis 03/11/2012  . DM type 2 (diabetes mellitus, type 2) 03/11/2012  . OSA (obstructive sleep apnea) 03/11/2012  . Elevated serum creatinine 03/11/2012    Expected Discharge Date: Expected Discharge Date: 04/03/12  Team Members Present: Physician: Dr. Claudette Laws Case Manager Present: Lutricia Horsfall, RN Social Worker Present: Dossie Der, LCSW Nurse Present: Gregor Hams, RN PT Present: Edman Circle, PT OT Present: Bretta Bang, Verlene Mayer, OT Verlon Setting, RN Tora Duck, RN, PPS Coord    Current Status/Progress Goal Weekly Team Focus  Medical   diabetes uncontrolled, nausea, urinary retention  CBGs in desired range, urinary emptying  adjust medications, time toileting   Bowel/Bladder   Continent of bowel; required an in and out cath on 03/19/12; patient started on flomax, difficulty starting urine stream  Continent of bowel and bladder      Swallow/Nutrition/ Hydration   Dys 3, nectar.  MBS scheduled 7/3 canceled due to pt. nauseous  min assist  utilization of precautions   ADL's   patient is currently Supervision for grooming and Min Assist for bathing/dressing. Functional transfers: Mod A; squat pivot   Min Assist for shower transfers/toileting, Supervision for  eating/bathing/toilet transfer/grooming, Mod I for UB dressing  Increase ADL performance, A/ROM RUE, sitting and standing balance, functional transfers.    Mobility   mod-max A transfers, w/c mobility, total A gait  mod I w/c mobility, supervision-min A transfers, gait  trunk control, activity tolerance, transfers   Communication   mod assist  supervision-min A  carryover of speech strategies   Safety/Cognition/ Behavioral Observations  min-mod  supervision  respiratory and phonatory coordination, speech strategies   Pain   Pain in right arm/left shoulder, managed with PRN ultram and tylenol  </=3      Skin   Bruising to abdomen.  No new skin breakdown.         *See Interdisciplinary Assessment and Plan and progress notes for long and short-term goals  Barriers to Discharge: heavy physical assistance    Possible Resolutions to Barriers:  continue therapy and teach family    Discharge Planning/Teaching Needs:  Home with girlfriend to assist and children to be involved.  Meeting with daughter and pt after conf to confirm a discharge plan      Team Discussion:  Pt with N&V this am. Question of d/t Flomax which he started last PM. Will d/c Flomax. Still needs caths at times for PVRs. PVRs improving.  MBS canceled today. Fatigues easily. Sleeps a lot. Flaot affect.    Revisions to Treatment Plan:     Continued Need for Acute Rehabilitation Level of Care: The patient requires daily medical management by a physician with specialized training in physical medicine and rehabilitation for the following conditions: Daily direction of a multidisciplinary physical rehabilitation program  to ensure safe treatment while eliciting the highest outcome that is of practical value to the patient.: Yes Daily medical management of patient stability for increased activity during participation in an intensive rehabilitation regime.: Yes Daily analysis of laboratory values and/or radiology reports with any  subsequent need for medication adjustment of medical intervention for : Neurological problems  Meryl Dare 03/22/2012, 9:52 AM

## 2012-03-20 NOTE — Care Management Note (Signed)
Met with pt and family and discussed pt's progress, goals and discharge plans.

## 2012-03-20 NOTE — Progress Notes (Signed)
INITIAL ADULT NUTRITION ASSESSMENT Date: 03/20/2012   Time: 2:09 PM  Reason for Assessment: Poor PO Intake  ASSESSMENT: Male 78 y.o.  Dx: CVA (cerebral vascular accident)  Hx:  Past Medical History  Diagnosis Date  . Hypercholesteremia   . Restless leg syndrome   . Sleep apnea   . Type II diabetes mellitus   . H/O hiatal hernia   . GERD (gastroesophageal reflux disease)   . Headache 03/11/12    "up until ~ 2 yr ago I had them daily"  . Stroke 03/07/12    slurred speech; right hemplegia  . Basal cell carcinoma of nose 2002    "bridge of nose"  . History of kidney problems 12/01/10    "lacerated kidney"   Past Surgical History  Procedure Date  . Prostate surgery 2010    Laser  . Tonsillectomy and adenoidectomy ~ 1945  . Hemorrhoid surgery 1991  . Cataract extraction w/ intraocular lens implant 11//2012 - 08/2011  . Cardiac catheterization 2004    "normal"  . Inguinal hernia repair 1981; 2002    left; "repaired left"  . Skin cancer excision 2002    bridge of nose   Related Meds:     . atorvastatin  40 mg Oral q1800  . clopidogrel  75 mg Oral Q breakfast  . DULoxetine  60 mg Oral Daily  . enoxaparin  40 mg Subcutaneous Q24H  . HYDROcodone-acetaminophen  1 tablet Oral Once  . insulin aspart  0-15 Units Subcutaneous TID WC  . insulin glargine  45 Units Subcutaneous QHS  . lisinopril  10 mg Oral Daily  . pantoprazole  40 mg Oral Q1200  . pramipexole  1 mg Oral Daily  . Tamsulosin HCl  0.4 mg Oral QPC supper  . DISCONTD: insulin glargine  40 Units Subcutaneous QHS   Ht: 5\' 9"  (175.3 cm)  Wt: 172 lb 6.4 oz (78.2 kg)  Ideal Wt: 72.7 kg % Ideal Wt: 108%  Wt Readings from Last 15 Encounters:  03/20/12 172 lb 6.4 oz (78.2 kg)  03/11/12 180 lb (81.647 kg)  Usual Wt: 180 - 183 lb % Usual Wt: 96%  Body mass index is 25.46 kg/(m^2). Overweight.  Food/Nutrition Related Hx: Regular diet PTA, fairly health conscious  Labs:  CMP     Component Value Date/Time   NA  136 03/14/2012 0610   K 4.2 03/14/2012 0610   CL 101 03/14/2012 0610   CO2 21 03/14/2012 0610   GLUCOSE 167* 03/14/2012 0610   BUN 33* 03/14/2012 0610   CREATININE 1.21 03/14/2012 0610   CALCIUM 9.6 03/14/2012 0610   PROT 7.7 03/14/2012 0610   ALBUMIN 3.7 03/14/2012 0610   AST 18 03/14/2012 0610   ALT 15 03/14/2012 0610   ALKPHOS 40 03/14/2012 0610   BILITOT 0.5 03/14/2012 0610   GFRNONAA 56* 03/14/2012 0610   GFRAA 64* 03/14/2012 0610   CBG (last 3)   Basename 03/20/12 1150 03/20/12 0729 03/19/12 2138  GLUCAP 224* 212* 241*    Intake/Output Summary (Last 24 hours) at 03/20/12 1409 Last data filed at 03/20/12 0800  Gross per 24 hour  Intake    120 ml  Output      0 ml  Net    120 ml   Diet Order: Dysphagia 3 with Nectar Liquids  Supplements/Tube Feeding: none  IVF:    Estimated Nutritional Needs:   Kcal: 1600 - 1900 kcal Protein:  70 - 80 grams Fluid:  2 liters daily  Pt with  PMHx of DM and hyperlipidemia. Admitted to rehab s/p stroke. Pt is currently ordered for Dysphagia 3 diet with Nectar Liquids. Pt hopeful to upgrade diet soon to help improve diet choices. Pt was supposed to have MBSS today however declined 2/2 increased nausea.  Pt asked this RD to re-order dinner. Discussed options available, pt states he is tired of chicken and prefers more variety. This RD called [facility] and discussed additional side options. Pt states he limits how much potatoes he eats and does not want to receive mashed potatoes while here. RD ordered new meal with new sides. Noted that when discussing his current weight of 172 lb, pt states he is happy he is losing weight. Discussed need for adequate nutrition while in rehab and emphasized weight maintenance while here. Pt verbalized understanding and is agreeable to Ensure Pudding. Pt does not like Magic [employer] supplements.  Pt is at nutrition risk given decline in PO intake and recent wt decline.  NUTRITION DIAGNOSIS: -Swallowing difficulty (NI-1.1).  Status:  Ongoing  RELATED TO: recent CVA  AS EVIDENCE BY: need for thickened liquids and ST  MONITORING/EVALUATION(Goals): Goal: Pt to meet >/= 90% of their estimated nutrition needs. Monitor: weights, labs, PO intake, I/O's, diet advancement  EDUCATION NEEDS: -Education needs addressed  INTERVENTION: 1. Ensure Pudding po TID, each supplement provides 170 kcal and 4 grams of protein.  2. Suspect improvement in diet with potential diet upgrade 3. Encourage variety of foods 4. RD to continue to follow nutrition care plan   DOCUMENTATION CODES Per approved criteria  -Not Applicable   James Motto MS, RD, LDN Pager: [phone] After-hours pager: [phone]   James Bell 03/20/2012, 2:09 PM

## 2012-03-20 NOTE — Progress Notes (Signed)
Physical Therapy Weekly Progress Note  Patient Details  Name: James Bell MRN: [id number] Date of Birth: 1930-12-27  Today's Date: 03/20/2012 Time: 4098-1191 Time Calculation (min): 60 min  Patient is making slow but steady progress and has met 1 of 4 short term goals.  Patient is currently mod-max A overall for bed mobility, basic transfers, min-mod A w/c mobility on unit with hemi technique and total A for standing and gait training.  Patient's participation is often limited by fatigue and nausea.     Patient continues to demonstrate the following deficits: decreased endurance, R hemiplegia with impaired motor control, timing and sequencing, apraxia, some impulsive behaviors, impaired safety awareness, problem solving, decreased attention to RUE, impaired postural control, sitting and standing balance and gait and therefore will continue to benefit from skilled PT intervention to enhance overall performance with activity tolerance, balance, postural control, ability to compensate for deficits, functional use of  right upper extremity and right lower extremity, attention, awareness and coordination.  Patient progressing toward long term goals..  Continue plan of care.  PT Short Term Goals Week 1:  PT Short Term Goal 1 (Week 1): Transfer to the left, squat pivot with min@ consistently PT Short Term Goal 1 - Progress (Week 1): Progressing toward goal PT Short Term Goal 2 (Week 1): Supine to sit from the left side with supervision PT Short Term Goal 2 - Progress (Week 1): Progressing toward goal PT Short Term Goal 3 (Week 1): W/c propulsion x 50' with L extremities min@ in controlled environment PT Short Term Goal 3 - Progress (Week 1): Met PT Short Term Goal 4 (Week 1): Gait with appropriate assistive device x 25' with max@. PT Short Term Goal 4 - Progress (Week 1): Progressing toward goal Week 2:  PT Short Term Goal 1 (Week 2): Patient will perform bed mobility and bed<>w/c transfers to  L and R consistently with min A PT Short Term Goal 2 (Week 2): Patient will perform w/c mobility on unit with L hemi technique x 150 on unit min A PT Short Term Goal 3 (Week 2): Patient will perform standing pre gait and gait training with appropriate AD and orthosis x 25' max A  Therapy Documentation Precautions:  Precautions Precautions: Fall Restrictions Weight Bearing Restrictions: No Pain: Pain Assessment Pain Assessment: No/denies pain Locomotion : Wheelchair Mobility Distance: 150 with mod A for problem solving and sequencing L hemi technique propulsion for direction changes, doorways and negotiating obstacles  Other Treatments: Treatments Therapeutic Activity: Patient performed bed mobility with mod A overall: patient able to bring bilat LE off bed but required lifting assistance to come fully upright and shift weight to L side from R sidelying; performed squat pivot to w/c with max A for lifting, full pivot and lowering with cues for safe hand placement and full anterior lean.  Patient reporting need to urinate; transfers w/c <> toilet with grab bar and max A stand pivot and for balance in standing during doffing and donning of shorts.  In gym performed stand pivot transfers w/c <> Kinetron with max A to stabilize and advance RLE. Neuromuscular Facilitation: Right;Lower Extremity;Forced use;Activity to increase motor control;Activity to increase coordination;Activity to increase timing and sequencing;Activity to increase sustained activation;Activity to increase lateral weight shifting;Activity to increase anterior-posterior weight shifting on Kinetron beginning in sitting with bilat LE extensions to push down pedals at 20 cm/sec x 2 minutes with UE support for trunk control and then for 2 min more without UE support to increase  activation of lateral trunk muscles; performed 3 reps sit <> stand and NMR in standing on Kinetron for lateral weight shifts with activation of RLE extensors,  while maintaining upright trunk posture and minimize pushing through LLE.  Returned to tall sitting on Kinetron without UE support and maintaining upright trunk in midline during LLE hip flexion off floor x 5-6 seconds for activation of RLE extensors to stabilize.    See FIM for current functional status  Therapy/Group: Individual Therapy  Edman Circle [facility] 03/20/2012, 5:09 PM

## 2012-03-20 NOTE — Progress Notes (Signed)
Occupational Therapy Note  Patient Details  Name: James Bell MRN: [id number] Date of Birth: 09/30/1930 Today's Date: 03/20/2012  Time: 1130-1145 Pt denies pain Individual therapy Pt participated in self feeding group with focus on using RUE as gross assist to hold containers when opening with LUE.  Pt required total A to use RUE as gross assist.  Pt required mod verbal cues to incorporate RUE during activities.  Pt stated he did not have much appetite because he did not feel very well today.  Lavone Neri [facility] 03/20/2012, 3:11 PM

## 2012-03-20 NOTE — Progress Notes (Signed)
Speech Language Pathology Daily Session Note [employer]  Patient Details  Name: James Bell MRN: [id number] Date of Birth: April 05, 1931  Today's Date: 03/20/2012 Time: 4098-1191 Time Calculation (min): 30 min  Short Term Goals: Week 1: SLP Short Term Goal 1 (Week 1): Patient will verbally express wants and needs with supervision verbal cues SLP Short Term Goal 1 - Progress (Week 1): Progressing toward goal SLP Short Term Goal 2 (Week 1): Patient will utilize a slow pace and increased volume with verbal expression at the sentence level with supervision level semantic cues SLP Short Term Goal 2 - Progress (Week 1): Progressing toward goal SLP Short Term Goal 3 (Week 1): Patient will attend to right upper extremity with supervision semantic cues SLP Short Term Goal 3 - Progress (Week 1): Progressing toward goal SLP Short Term Goal 4 (Week 1): Patient will request help as needed while completing BADLS with minimal assist semantic cues SLP Short Term Goal 4 - Progress (Week 1): Progressing toward goal SLP Short Term Goal 5 (Week 1): Patient will utilize diaphragmatic breath support/control strategy with sentence level expression with minimal verbal guidance/cues. SLP Short Term Goal 5 - Progress (Week 1): Progressing toward goal  Skilled Therapeutic Interventions: Pt seen in [employer] with focus on utilization of swallowing compensatory strategies. Pt with cough X 2 with dys. 3 textures, suspect due to decreased attention to bolus. Pt also required Min questioning cueing for functional problem solving to utilize RUE as a stabilizer to open containers. Pt with decreased speech intelligibility but was ~90% intelligible with verbal cueing for utilization of strategies.    FIM:  FIM - Eating Eating Activity: 5: Supervision/cues;5: Set-up assist for open containers  Pain Pain Assessment Pain Assessment: No/denies pain  Therapy/Group: Dysphagia Group  Terryn Redner 03/20/2012, 5:19  PM

## 2012-03-21 ENCOUNTER — Inpatient Hospital Stay (HOSPITAL_COMMUNITY): Payer: Medicare Other

## 2012-03-21 LAB — GLUCOSE, CAPILLARY
Glucose-Capillary: 153 mg/dL — ABNORMAL HIGH (ref 70–99)
Glucose-Capillary: 183 mg/dL — ABNORMAL HIGH (ref 70–99)
Glucose-Capillary: 134 mg/dL — ABNORMAL HIGH (ref 70–99)
Glucose-Capillary: 190 mg/dL — ABNORMAL HIGH (ref 70–99)

## 2012-03-21 NOTE — Progress Notes (Signed)
Recreational Therapy Session Note  Patient Details  Name: James Bell MRN: [id number] Date of Birth: 11-06-1930 Today's Date: 03/21/2012 Time: 1308-6578 Pain: no c/o Skilled Therapeutic Interventions/Progress Updates: Out and about on hospital grounds to raised bed gardens.  Pt propelled w/c using LUE/LLE on uneven, outdoor, tiled surface with min-mod assist to negotiate obstacles.  Pt pulled "dead heads" off plants while seated.  Practiced functional transfers, squat pivot, to and from dining room chair and to couch.  Therapy/Group: Co-Treatment  Maeson Purohit 03/21/2012, 3:18 PM

## 2012-03-21 NOTE — Progress Notes (Signed)
Occupational Therapy Note  Patient Details  Name: James Bell MRN: [id number] Date of Birth: [birth year] Today's Date: 03/21/2012  Time: 1130-1145 Pt denies pain Group Therapy Pt participated in self-feeding group with focus on swallowing strategies, using RUE as gross assist with opening containers, and interaction with participants. Pt requires tot A to use RUE as gross assist with opening yogurt container.  Pt requires verbal cues to follow swallowing strategies.  Pt interacted appropriately with other participants.  Lavone Neri [facility] 03/21/2012, 3:18 PM

## 2012-03-21 NOTE — Progress Notes (Signed)
Speech Language Pathology Daily Session Note [employer]  Patient Details  Name: James Bell MRN: [id number] Date of Birth: 05/26/1931  Today's Date: 03/21/2012 Time: 4098-1191 Time Calculation (min): 30 min  Short Term Goals: Week 2: SLP Short Term Goal 1 (Week 2): Pt. will consume regular diet with thin liquids with minimal s/s aspiration demonstrating swallow precautions with supervision level assist  SLP Short Term Goal 2 (Week 2): Pt. will utilize diaphragmatic breathing in conversation for increased vocal control and increased words per breath with min verbal assist. SLP Short Term Goal 3 (Week 2): Pt. will attend to upper right extremity during activities and in his environment with supervision verbal/visual cues. SLP Short Term Goal 4 (Week 2): Pt. will demonstrate appropriate executive functioning abilities via diagnostic assessment given supervision cues.  Skilled Therapeutic Interventions: Pt participated in [employer] with focus on swallowing compensatory strategies, speech intelligibility and utilization of RUE as gross assist with opening containers. Pt requires total A to use RUE as gross assist with opening yogurt container. Pt required Min verbal and question cues for utilization of new compensatory swallowing strategies with regular textures and thin liquids. Pt with cough X 1 due to large volume of thin liquids via cup. Pt interacted appropriately with other participants.    FIM:  Comprehension Comprehension Mode: Auditory Comprehension: 5-Follows basic conversation/direction: With extra time/assistive device Expression Expression Mode: Verbal Expression: 4-Expresses basic 75 - 89% of the time/requires cueing 10 - 24% of the time. Needs helper to occlude trach/needs to repeat words. Social Interaction Social Interaction: 5-Interacts appropriately 90% of the time - Needs monitoring or encouragement for participation or interaction. Problem Solving Problem  Solving: 5-Solves basic 90% of the time/requires cueing < 10% of the time Memory Memory: 4-Recognizes or recalls 75 - 89% of the time/requires cueing 10 - 24% of the time FIM - Eating Eating Activity: 5: Supervision/cues  Pain Pain Assessment Pain Assessment: No/denies pain  Therapy/Group: Dysphagia Group  Ezrah Dembeck 03/21/2012, 4:57 PM

## 2012-03-21 NOTE — Procedures (Addendum)
Objective Swallowing Evaluation: Modified Barium Swallowing Study  Patient Details  Name: James Bell MRN: [id number] Date of Birth: [birth year]  Today's Date: 03/21/2012 Time:0905-0925 Time Calculation (min): 20 min  Past Medical History:  Past Medical History  Diagnosis Date  . Hypercholesteremia   . Restless leg syndrome   . Sleep apnea   . Type II diabetes mellitus   . H/O hiatal hernia   . GERD (gastroesophageal reflux disease)   . Headache 03/11/12    "up until ~ 2 yr ago I had them daily"  . Stroke 03/07/12    slurred speech; right hemplegia  . Basal cell carcinoma of nose 2002    "bridge of nose"  . History of kidney problems 12/01/10    "lacerated kidney"   Past Surgical History:  Past Surgical History  Procedure Date  . Prostate surgery 2010    Laser  . Tonsillectomy and adenoidectomy ~ 1945  . Hemorrhoid surgery 1991  . Cataract extraction w/ intraocular lens implant 11//2012 - 08/2011  . Cardiac catheterization 2004    "normal"  . Inguinal hernia repair 1981; 2002    left; "repaired left"  . Skin cancer excision 2002    bridge of nose   HPI:  78 y.o. male with initial left internal capsule CVA on 6/20, was d/c'd home and started experienceing worsening symptoms of slurred speech and right hemiparesis.  CT of brain showed hypodensity in the left basal ganglia/periventricular white matter. MRI results from 6/24 revealed a moderate sized area of acute infarction affects the left posterior basal ganglia and periventricular white matter without hemorrhage.       Recommendation/Prognosis  Clinical Impression Dysphagia Diagnosis: Mild oral phase dysphagia;Mild pharyngeal phase dysphagia Clinical impression: Patient continues to present with a primary sensory based oral-pharyngeal dysphagia with a mild motor weakness of the base of tongue and laryngeal function, however demonstrates improvement in overall function since previous MBS.   Sensory deficits result in  premature loss of oral bolus directly into the laryngeal vestibule (trace penetration) before the swallow response with inconsistent throat clears (in approximately 50% of episodes.). Head tilt and tuck to the left does assist to decrease degree and frequency of penetration episodes and cued throat clear consistently effective to clear the laryngeal vestibule.   Patient judged safe to advance to a regular diet, thin liquids at this time with full supervision for strict use of aspiration precautions to decrease risks.  Swallow Evaluation Recommendations Diet Recommendations: Regular;Thin liquid Liquid Administration via: Cup;No straw Medication Administration: Whole meds with puree Supervision: Patient able to self feed;Full supervision/cueing for compensatory strategies Compensations: Slow rate;Small sips/bites;Check for pocketing;Clear throat intermittently (clear throat every other sip of liquid) Postural Changes and/or Swallow Maneuvers: Seated upright 90 degrees;Upright 30-60 min after meal;Head tilt left during swallow;Chin tuck Oral Care Recommendations: Oral care BID Follow up Recommendations: Inpatient Rehab   Individuals Consulted Consulted and Agree with Results and Recommendations: Patient;RN  SLP Assessment/Plan Dysphagia Diagnosis: Mild oral phase dysphagia;Mild pharyngeal phase dysphagia Clinical impression: Patient continues to present with a primary sensory based oral-pharyngeal dysphagia with a mild motor weakness of the base of tongue and laryngeal function, however demonstrates improvement in overall function since previous MBS.   Sensory deficits result in premature loss of oral bolus directly into the laryngeal vestibule (trace penetration) before the swallow response with inconsistent throat clears (in approximately 50% of episodes.). Head tilt and tuck to the left does assist to decrease degree and frequency of penetration episodes and cued throat  clear consistently effective  to clear the laryngeal vestibule.   Patient judged safe to advance to a regular diet, thin liquids at this time with full supervision for strict use of aspiration precautions to decrease risks.   Short Term Goals: Week 2: SLP Short Term Goal 1 (Week 2): Pt. will consume regular diet with thin liquids with minimal s/s aspiration demonstrating swallow precautions with supervision level assist  SLP Short Term Goal 2 (Week 2): Pt. will utilize diaphragmatic breathing in conversation for increased vocal control and increased words per breath with min verbal assist. SLP Short Term Goal 3 (Week 2): Pt. will attend to upper right extremity during activities and in his environment with supervision verbal/visual cues. SLP Short Term Goal 4 (Week 2): Pt. will demonstrate appropriate executive functioning abilities via diagnostic assessment given supervision cues.  General:  Date of Onset: 03/11/12 HPI: 78 y.o. male with initial left internal capsule CVA on 6/20, was d/c'd home and started experienceing worsening symptoms of slurred speech and right hemiparesis.  CT of brain showed hypodensity in the left basal ganglia/periventricular white matter. MRI results from 6/24 revealed a moderate sized area of acute infarction affects the left posterior basal ganglia and periventricular white matter without hemorrhage.   Type of Study: Modified Barium Swallowing Study Previous Swallow Assessment: MBS 03/12/12 recommended dysphagia 3, nectar thick liquids due to silent aspiration of thin liquids Diet Prior to this Study: Dysphagia 3 (soft);Nectar-thick liquids Temperature Spikes Noted: No Respiratory Status: Room air History of Recent Intubation: No Behavior/Cognition: Alert;Cooperative;Pleasant mood Oral Cavity - Dentition: Adequate natural dentition Oral Motor / Sensory Function: Impaired - see Bedside swallow eval Self-Feeding Abilities: Able to feed self Patient Positioning: Upright in chair Baseline Vocal  Quality: Clear Volitional Cough: Strong Volitional Swallow: Able to elicit Anatomy: Within functional limits Pharyngeal Secretions: Not observed secondary MBS  Reason for Referral:    objectively evaluate swallowing function  Oral Phase Oral Preparation/Oral Phase Oral Phase: Impaired Oral - Nectar Oral - Nectar Cup: Lingual/palatal residue (trace) Oral - Thin Oral - Thin Cup: Lingual/palatal residue (trace) Oral - Solids Oral - Puree: Lingual/palatal residue (trace) Oral - Mechanical Soft: Lingual/palatal residue (trace) Oral - Regular: Not tested Pharyngeal Phase  Pharyngeal Phase Pharyngeal Phase: Impaired Pharyngeal - Nectar Pharyngeal - Nectar Cup: Reduced airway/laryngeal closure;Reduced anterior laryngeal mobility;Reduced tongue base retraction;Delayed swallow initiation;Premature spillage to pyriform sinuses;Penetration/Aspiration before swallow Penetration/Aspiration details (nectar cup): Material enters airway, CONTACTS cords then ejected out Pharyngeal - Thin Pharyngeal - Thin Cup: Delayed swallow initiation;Premature spillage to pyriform sinuses;Reduced anterior laryngeal mobility;Reduced airway/laryngeal closure;Reduced tongue base retraction;Penetration/Aspiration before swallow Penetration/Aspiration details (thin cup): Material enters airway, CONTACTS cords and not ejected out (cued throat clear effective to clear penetrates) Pharyngeal - Solids Pharyngeal - Puree: Delayed swallow initiation;Premature spillage to valleculae Pharyngeal - Mechanical Soft: Delayed swallow initiation;Premature spillage to valleculae Cervical Esophageal Phase  Cervical Esophageal Phase Cervical Esophageal Phase: [facility]  Ferdinand Lango MA, CCC-SLP [phone]   Ramsie Ostrander Meryl 03/21/2012, 9:46 AM

## 2012-03-21 NOTE — Progress Notes (Signed)
Physical Therapy Session Note  Patient Details  Name: James Bell MRN: [id number] Date of Birth: [birth year]  Today's Date: 03/21/2012 Time: 1400-1500 Time Calculation (min): 60 min  Short Term Goals: Week 1:  PT Short Term Goal 1 (Week 1): Transfer to the left, squat pivot with min@ consistently PT Short Term Goal 1 - Progress (Week 1): Progressing toward goal PT Short Term Goal 2 (Week 1): Supine to sit from the left side with supervision PT Short Term Goal 2 - Progress (Week 1): Progressing toward goal PT Short Term Goal 3 (Week 1): W/c propulsion x 50' with L extremities min@ in controlled environment PT Short Term Goal 3 - Progress (Week 1): Met PT Short Term Goal 4 (Week 1): Gait with appropriate assistive device x 25' with max@. PT Short Term Goal 4 - Progress (Week 1): Progressing toward goal Week 2:  PT Short Term Goal 1 (Week 2): Patient will perform bed mobility and bed<>w/c transfers to L and R consistently with min A PT Short Term Goal 2 (Week 2): Patient will perform w/c mobility on unit with L hemi technique x 150 on unit min A PT Short Term Goal 3 (Week 2): Patient will perform standing pre gait and gait training with appropriate AD and orthosis x 25' max A  Therapy Documentation Precautions:  Precautions Precautions: Fall Restrictions Weight Bearing Restrictions: No Pain: Pain Assessment Pain Assessment: No/denies pain Locomotion : Wheelchair Mobility Distance: 50  Other Treatments: Treatments Therapeutic Activity: Patient performed bed mobility with mod A overall: patient able to bring bilat LE off bed but required lifting assistance to come fully upright and shift weight to L side from R sidelying; performed squat pivot to w/c with max A for lifting, full pivot and lowering with cues for safe hand placement and full anterior lean. Patient reporting need to urinate; transfers w/c <> toilet with grab bar and max A stand pivot and for balance in standing during  doffing and donning of shorts.  Patient able to void in toilet this time.  Performed functional transfer training from w/c <> dining room chair with fixed arm rests at table and w/c <> low, soft couch verbalizing sequence prior to performing; patient continues to require max A to maintain upright trunk in midline during anterior lean to prevent R LOB and to completely bring COG over BOS during squat to prevent posterior LOB and to for lifting assistance from low surface; once hips elevated patient able to pivot buttocks to L and R.  Performed functional w/c mobility training outdoors over uneven brick navigating around obstacles during gardening activity with verbal cues to attend to R side and for propulsion sequencing.  See FIM for current functional status  Therapy/Group: Individual Therapy and Co-Treatment with Recreation therapy  Edman Circle [facility] 03/21/2012, 4:51 PM

## 2012-03-21 NOTE — Progress Notes (Signed)
Occupational Therapy Weekly Progress Note and Treatment  Patient Details  Name: James Bell MRN: [id number] Date of Birth: Apr 26, 1931  Today's Date: 03/21/2012 Time: 0730-0830 Time Calculation (min): 60 min  Patient has met 4 of 5 short term goals.    Patient continues to demonstrate the following deficits: decreased standing and sitting balance, ADL performance, functional transfers. RUE A/ROM and therefore will continue to benefit from skilled OT intervention to enhance overall performance with BADL.  Patient progressing toward long term goals..  Continue plan of care.  OT Short Term Goals Week 1:  OT Short Term Goal 1 (Week 1): Patient will stand at sink using right upper extremity as support, with no more than min assist during bathing and dressing tasks. OT Short Term Goal 1 - Progress (Week 1): Progressing toward goal OT Short Term Goal 2 (Week 1): Patient will transfer to toilet with min assist  OT Short Term Goal 2 - Progress (Week 1): Met OT Short Term Goal 3 (Week 1): Patient will transfer to toilet with min assist  OT Short Term Goal 3 - Progress (Week 1): Met OT Short Term Goal 4 (Week 1): Patient will transfer to toilet with min assist  OT Short Term Goal 4 - Progress (Week 1): Met OT Short Term Goal 5 (Week 1): Patient will complete toileting with max assist  OT Short Term Goal 5 - Progress (Week 1): Met Week 2:  OT Short Term Goal 1 (Week 2): STG: Continue to progress towards LTGs.  Skilled Therapeutic Interventions/Progress Updates:  ADL re-training in shower this AM. Patient is progressing with functional transfers to toilet and shower. Patient still has decreased standing balance during transfers and needs cues to stand up slow and move at a controlled pace when performing a stand pivot transfer. Patient's sitting balance is still poor during LB bathing. When patient leans down to wash lower right leg he requires contact guard to maintain balance. Patient uses  wall frequently to aid in balance. Patient performed UB dressing successfully requiring 2 trial and errors. Patient was provided with cues for motivation. Patient was in a better mood this AM compared to yesterday and stated that he was feeling better.  Therapy Documentation Precautions:  Precautions Precautions: Fall Restrictions Weight Bearing Restrictions: No Pain: Pain Assessment Pain Assessment: No/denies pain  See FIM for current functional status  Therapy/Group: Individual Therapy  Luvern Mischke, Charisse March 03/21/2012, 8:28 AM

## 2012-03-21 NOTE — Plan of Care (Signed)
Problem: RH BOWEL ELIMINATION Goal: Bowel movement every 2 days Outcome: Not Progressing Patient LBM 6/30, refuses laxative at this time.

## 2012-03-21 NOTE — Progress Notes (Addendum)
Patient ID: James Bell, male   DOB: 1930-11-28, 78 y.o.   MRN: [id number] Subjective/Complaints:  HPI: James Bell is a 78 y.o. right-handed male with history of diabetes mellitus and hyperlipidemia. 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 MRI revealed moderate size acute infarct affecting the left posterior putamen and left periventricular white matter. MRA of the head was unremarkable. Carotid Dopplers with no ICA stenosis. Echocardiogram with ejection fraction of 65% and grade 1 diastolic dysfunction. Neurology consulted placed on Plavix therapy. Patient was not a TPA candidate. Subcutaneous Lovenox added for DVT prophylaxis.  Nausea resolved Review of Systems  Musculoskeletal: Positive for joint pain.       R shoulder  Neurological: Positive for speech change.   Objective: Vital Signs: Blood pressure 116/71, pulse 74, temperature 97.5 F (36.4 C), temperature source Oral, resp. rate 17, height 5\' 9"  (1.753 m), weight 77.2 kg (170 lb 3.1 oz), SpO2 97.00%. No results found. Results for orders placed during the hospital encounter of 03/13/12 (from the past 72 hour(s))  GLUCOSE, CAPILLARY     Status: Abnormal   Collection Time   03/18/12 11:23 AM      Component Value Range Comment   Glucose-Capillary 201 (*) 70 - 99 mg/dL    Comment 1 Notify RN     GLUCOSE, CAPILLARY     Status: Abnormal   Collection Time   03/18/12  4:32 PM      Component Value Range Comment   Glucose-Capillary 185 (*) 70 - 99 mg/dL   GLUCOSE, CAPILLARY     Status: Abnormal   Collection Time   03/18/12  9:37 PM      Component Value Range Comment   Glucose-Capillary 175 (*) 70 - 99 mg/dL   GLUCOSE, CAPILLARY     Status: Abnormal   Collection Time   03/19/12  7:08 AM      Component Value Range Comment   Glucose-Capillary 154 (*) 70 - 99 mg/dL    Comment 1 Notify RN     GLUCOSE, CAPILLARY     Status: Abnormal   Collection Time   03/19/12 11:19 AM   Component Value Range Comment   Glucose-Capillary 249 (*) 70 - 99 mg/dL    Comment 1 Notify RN     GLUCOSE, CAPILLARY     Status: Abnormal   Collection Time   03/19/12  4:42 PM      Component Value Range Comment   Glucose-Capillary 201 (*) 70 - 99 mg/dL   GLUCOSE, CAPILLARY     Status: Abnormal   Collection Time   03/19/12  9:38 PM      Component Value Range Comment   Glucose-Capillary 241 (*) 70 - 99 mg/dL   GLUCOSE, CAPILLARY     Status: Abnormal   Collection Time   03/20/12  7:29 AM      Component Value Range Comment   Glucose-Capillary 212 (*) 70 - 99 mg/dL    Comment 1 Notify RN     GLUCOSE, CAPILLARY     Status: Abnormal   Collection Time   03/20/12 11:50 AM      Component Value Range Comment   Glucose-Capillary 224 (*) 70 - 99 mg/dL    Comment 1 Notify RN     GLUCOSE, CAPILLARY     Status: Abnormal   Collection Time   03/20/12  4:46 PM      Component Value Range Comment   Glucose-Capillary 152 (*) 70 - 99  mg/dL   GLUCOSE, CAPILLARY     Status: Abnormal   Collection Time   03/20/12  9:23 PM      Component Value Range Comment   Glucose-Capillary 168 (*) 70 - 99 mg/dL    Comment 1 Notify RN     GLUCOSE, CAPILLARY     Status: Abnormal   Collection Time   03/21/12  7:31 AM      Component Value Range Comment   Glucose-Capillary 153 (*) 70 - 99 mg/dL    Comment 1 Notify RN        HEENT: normal Cardio: RRR Resp: CTA B/L GI: BS positive Extremity:  No Edema Skin:   Intact Neuro: Alert/Oriented, Cranial Nerve Abnormalities R central VII, Normal Sensory, Abnormal Motor 2-/5 R shoulder protraction and Dysarthric,  RLE 2-/5 hipextension, knee ext synergy, 0/5 R ankle Musc/Skel:  No pain with shoulder ROM  Assessment/Plan: 1. Functional deficits secondary to R putamen infarct with L HP, dysarthria and dysphagia which require 3+ hours per day of interdisciplinary therapy in a comprehensive inpatient rehab setting. Physiatrist is providing close team supervision and 24 hour management  of active medical problems listed below. Physiatrist and rehab team continue to assess barriers to discharge/monitor patient progress toward functional and medical goals. FIM: FIM - Bathing Bathing Steps Patient Completed: Chest;Right Arm;Left Arm;Abdomen;Front perineal area;Buttocks;Right upper leg;Left upper leg;Right lower leg (including foot);Left lower leg (including foot) Bathing: 4: Min-Patient completes 8-9 45f 10 parts or 75+ percent  FIM - Upper Body Dressing/Undressing Upper body dressing/undressing steps patient completed: Thread/unthread right sleeve of pullover shirt/dresss;Thread/unthread left sleeve of pullover shirt/dress;Put head through opening of pull over shirt/dress;Pull shirt over trunk Upper body dressing/undressing: 5: Set-up assist to: Obtain clothing/put away FIM - Lower Body Dressing/Undressing Lower body dressing/undressing steps patient completed: Thread/unthread right underwear leg;Thread/unthread left underwear leg;Pull underwear up/down;Thread/unthread right pants leg;Thread/unthread left pants leg;Pull pants up/down;Don/Doff right shoe;Don/Doff left shoe;Fasten/unfasten right shoe;Fasten/unfasten left shoe Lower body dressing/undressing: 3: Mod-Patient completed 50-74% of tasks  FIM - Toileting Toileting steps completed by patient: Adjust clothing prior to toileting;Performs perineal hygiene;Adjust clothing after toileting Toileting Assistive Devices: Grab bar or rail for support Toileting: 2: Max-Patient completed 1 of 3 steps  FIM - Diplomatic Services operational officer Devices: Elevated toilet seat;Grab bars Toilet Transfers: 4-To toilet/BSC: Min A (steadying Pt. > 75%);4-From toilet/BSC: Min A (steadying Pt. > 75%)  FIM - Bed/Chair Transfer Bed/Chair Transfer Assistive Devices: Bed rails;Arm rests Bed/Chair Transfer: 5: Supine > Sit: Supervision (verbal cues/safety issues);3: Bed > Chair or W/C: Mod A (lift or lower assist)  FIM - Locomotion:  Wheelchair Distance: 150 with mod A for problem solving and sequencing L hemi technique propulsion for direction changes, doorways and negotiating obstacles Locomotion: Wheelchair: 3: Travels 150 ft or more: maneuvers on rugs and over door sills with moderate assistance  (Pt: 50 - 74%) FIM - Locomotion: Ambulation Ambulation/Gait Assistance: 1: +2 Total assist Locomotion: Ambulation: 0: Activity did not occur  Comprehension Comprehension Mode: Auditory Comprehension: 5-Follows basic conversation/direction: With extra time/assistive device  Expression Expression Mode: Verbal Expression: 5-Expresses basic needs/ideas: With no assist  Social Interaction Social Interaction: 5-Interacts appropriately 90% of the time - Needs monitoring or encouragement for participation or interaction.  Problem Solving Problem Solving: 4-Solves basic 75 - 89% of the time/requires cueing 10 - 24% of the time  Memory Memory: 4-Recognizes or recalls 75 - 89% of the time/requires cueing 10 - 24% of the time  Medical Problem List and Plan:  1.  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 DVT Prophylaxis/Anticoagulation: Subcutaneous Lovenox. Monitor platelet counts and any signs of bleeding  3. Dysphagia. Mechanical soft with nectar liquids. Monitor for any signs of aspiration. Speech therapy to followup  4. Neuropsych: This patient is capable of making decisions on his/her own behalf.  5. Diabetes mellitus.Uncontrolled Hemoglobin A1c 6.3.Increase Lantus insulin 45 units each bedtime. Patient on 45 units Lantus prior to admission as well as janumet 50-100 twice a day. Check blood sugars a.c. and at bedtime.  Observe for 24 hrs consider adding metformin 6. Hypertension. Lisinopril 10 mg daily. Monitor with increased mobility  7. Hyperlipidemia. Lipitor  8. Restless leg syndrome. Continue Mirapex 1 mg daily  9. Obstructive sleep apnea. CPAP. Patient has been using a CPAP  machine for approximately 2 years. He is using his face mask from home. 10 urinary retention  Improved off flomax, nausea resolved  LOS (Days) 8 A FACE TO FACE EVALUATION WAS PERFORMED  James Bell E 03/21/2012, 8:18 AM

## 2012-03-22 DIAGNOSIS — G8110 Spastic hemiplegia affecting unspecified side: Secondary | ICD-10-CM

## 2012-03-22 DIAGNOSIS — Z5189 Encounter for other specified aftercare: Secondary | ICD-10-CM

## 2012-03-22 DIAGNOSIS — I6330 Cerebral infarction due to thrombosis of unspecified cerebral artery: Secondary | ICD-10-CM

## 2012-03-22 LAB — GLUCOSE, CAPILLARY
Glucose-Capillary: 168 mg/dL — ABNORMAL HIGH (ref 70–99)
Glucose-Capillary: 188 mg/dL — ABNORMAL HIGH (ref 70–99)
Glucose-Capillary: 132 mg/dL — ABNORMAL HIGH (ref 70–99)
Glucose-Capillary: 191 mg/dL — ABNORMAL HIGH (ref 70–99)

## 2012-03-22 MED ORDER — INSULIN GLARGINE 100 UNIT/ML ~~LOC~~ SOLN
50.0000 [IU] | Freq: Every day | SUBCUTANEOUS | Status: DC
  Administered 2012-03-22 – 2012-03-29 (×8): 50 [IU] via SUBCUTANEOUS

## 2012-03-22 NOTE — Progress Notes (Signed)
Patient ID: James Bell, male   DOB: 29-Oct-1930, 78 y.o.   MRN: [id number] Subjective/Complaints:  HPI: James Bell is a 78 y.o. right-handed male with history of diabetes mellitus and hyperlipidemia. 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 MRI revealed moderate size acute infarct affecting the left posterior putamen and left periventricular white matter. MRA of the head was unremarkable. Carotid Dopplers with no ICA stenosis. Echocardiogram with ejection fraction of 65% and grade 1 diastolic dysfunction. Neurology consulted placed on Plavix therapy. Patient was not a TPA candidate. Subcutaneous Lovenox added for DVT prophylaxis.  Nausea resolved. My BS are about what they are at home.  CBG 168 this am Review of Systems  Musculoskeletal: Positive for joint pain.       R shoulder  Neurological: Positive for speech change.   Objective: Vital Signs: Blood pressure 112/65, pulse 74, temperature 97.5 F (36.4 C), temperature source Oral, resp. rate 16, height 5\' 9"  (1.753 m), weight 77.2 kg (170 lb 3.1 oz), SpO2 98.00%. Dg Swallowing Func-no Report  03/21/2012  CLINICAL DATA: dysphagia   FLUOROSCOPY FOR SWALLOWING FUNCTION STUDY:  Fluoroscopy was provided for swallowing function study, which was  administered by a speech pathologist.  Final results and recommendations  from this study are contained within the speech pathology report.     Results for orders placed during the hospital encounter of 03/13/12 (from the past 72 hour(s))  GLUCOSE, CAPILLARY     Status: Abnormal   Collection Time   03/19/12 11:19 AM      Component Value Range Comment   Glucose-Capillary 249 (*) 70 - 99 mg/dL    Comment 1 Notify RN     GLUCOSE, CAPILLARY     Status: Abnormal   Collection Time   03/19/12  4:42 PM      Component Value Range Comment   Glucose-Capillary 201 (*) 70 - 99 mg/dL   GLUCOSE, CAPILLARY     Status: Abnormal   Collection Time   03/19/12   9:38 PM      Component Value Range Comment   Glucose-Capillary 241 (*) 70 - 99 mg/dL   GLUCOSE, CAPILLARY     Status: Abnormal   Collection Time   03/20/12  7:29 AM      Component Value Range Comment   Glucose-Capillary 212 (*) 70 - 99 mg/dL    Comment 1 Notify RN     GLUCOSE, CAPILLARY     Status: Abnormal   Collection Time   03/20/12 11:50 AM      Component Value Range Comment   Glucose-Capillary 224 (*) 70 - 99 mg/dL    Comment 1 Notify RN     GLUCOSE, CAPILLARY     Status: Abnormal   Collection Time   03/20/12  4:46 PM      Component Value Range Comment   Glucose-Capillary 152 (*) 70 - 99 mg/dL   GLUCOSE, CAPILLARY     Status: Abnormal   Collection Time   03/20/12  9:23 PM      Component Value Range Comment   Glucose-Capillary 168 (*) 70 - 99 mg/dL    Comment 1 Notify RN     GLUCOSE, CAPILLARY     Status: Abnormal   Collection Time   03/21/12  7:31 AM      Component Value Range Comment   Glucose-Capillary 153 (*) 70 - 99 mg/dL    Comment 1 Notify RN     GLUCOSE, CAPILLARY  Status: Abnormal   Collection Time   03/21/12 11:58 AM      Component Value Range Comment   Glucose-Capillary 183 (*) 70 - 99 mg/dL    Comment 1 Notify RN     GLUCOSE, CAPILLARY     Status: Abnormal   Collection Time   03/21/12  4:59 PM      Component Value Range Comment   Glucose-Capillary 134 (*) 70 - 99 mg/dL   GLUCOSE, CAPILLARY     Status: Abnormal   Collection Time   03/21/12  9:29 PM      Component Value Range Comment   Glucose-Capillary 190 (*) 70 - 99 mg/dL      HEENT: normal Cardio: RRR Resp: CTA B/L GI: BS positive Extremity:  No Edema Skin:   Intact Neuro: Alert/Oriented, Cranial Nerve Abnormalities R central VII, Normal Sensory, Abnormal Motor 2-/5 R shoulder protraction and Dysarthric,  RLE 2-/5 hipextension, knee ext synergy, 0/5 R ankle Musc/Skel:  No pain with shoulder ROM  Assessment/Plan: 1. Functional deficits secondary to R putamen infarct with L HP, dysarthria and dysphagia  which require 3+ hours per day of interdisciplinary therapy in a comprehensive inpatient rehab setting. Physiatrist is providing close team supervision and 24 hour management of active medical problems listed below. Physiatrist and rehab team continue to assess barriers to discharge/monitor patient progress toward functional and medical goals. FIM: FIM - Bathing Bathing Steps Patient Completed: Chest;Right Arm;Left Arm;Abdomen;Front perineal area;Buttocks;Right upper leg;Left upper leg;Right lower leg (including foot);Left lower leg (including foot) Bathing: 4: Min-Patient completes 8-9 109f 10 parts or 75+ percent  FIM - Upper Body Dressing/Undressing Upper body dressing/undressing steps patient completed: Thread/unthread right sleeve of pullover shirt/dresss;Thread/unthread left sleeve of pullover shirt/dress;Put head through opening of pull over shirt/dress;Pull shirt over trunk Upper body dressing/undressing: 5: Set-up assist to: Obtain clothing/put away FIM - Lower Body Dressing/Undressing Lower body dressing/undressing steps patient completed: Thread/unthread right underwear leg;Thread/unthread left underwear leg;Pull underwear up/down;Thread/unthread right pants leg;Thread/unthread left pants leg;Pull pants up/down;Don/Doff right shoe;Don/Doff left shoe;Fasten/unfasten right shoe;Fasten/unfasten left shoe Lower body dressing/undressing: 3: Mod-Patient completed 50-74% of tasks  FIM - Toileting Toileting steps completed by patient: Adjust clothing prior to toileting Toileting Assistive Devices: Grab bar or rail for support Toileting: 2: Max-Patient completed 1 of 3 steps  FIM - Diplomatic Services operational officer Devices: Grab bars Toilet Transfers: 2-To toilet/BSC: Max A (lift and lower assist);2-From toilet/BSC: Max A (lift and lower assist)  FIM - Press photographer Assistive Devices: Bed rails;Arm rests Bed/Chair Transfer: 3: Supine > Sit: Mod A (lifting  assist/Pt. 50-74%/lift 2 legs;2: Bed > Chair or W/C: Max A (lift and lower assist);2: Chair or W/C > Bed: Max A (lift and lower assist)  FIM - Locomotion: Wheelchair Distance: 50 Locomotion: Wheelchair: 1: Travels less than 50 ft with moderate assistance (Pt: 50 - 74%) FIM - Locomotion: Ambulation Ambulation/Gait Assistance: 1: +2 Total assist Locomotion: Ambulation: 0: Activity did not occur  Comprehension Comprehension Mode: Auditory Comprehension: 5-Follows basic conversation/direction: With no assist  Expression Expression Mode: Verbal Expression: 4-Expresses basic 75 - 89% of the time/requires cueing 10 - 24% of the time. Needs helper to occlude trach/needs to repeat words.  Social Interaction Social Interaction: 5-Interacts appropriately 90% of the time - Needs monitoring or encouragement for participation or interaction.  Problem Solving Problem Solving: 5-Solves basic 90% of the time/requires cueing < 10% of the time  Memory Memory: 4-Recognizes or recalls 75 - 89% of the time/requires cueing 10 -  24% of the time  Medical Problem List and Plan:  1. 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 DVT Prophylaxis/Anticoagulation: Subcutaneous Lovenox. Monitor platelet counts and any signs of bleeding  3. Dysphagia. Mechanical soft with nectar liquids. Monitor for any signs of aspiration. Speech therapy to followup  4. Neuropsych: This patient is capable of making decisions on his/her own behalf.  5. Diabetes mellitus.Uncontrolled Hemoglobin A1c 6.3.Increase Lantus insulin 45 units each bedtime. Patient on 45 units Lantus prior to admission as well as janumet 50-100 twice a day. Check blood sugars a.c. and at bedtime. Pt would rather try a higher dose of Lantus than restart oral agents.  Increase to 50Units 6. Hypertension. Lisinopril 10 mg daily. Monitor with increased mobility  7. Hyperlipidemia. Lipitor  8. Restless leg syndrome. Continue  Mirapex 1 mg daily  9. Obstructive sleep apnea. CPAP. Patient has been using a CPAP machine for approximately 2 years. He is using his face mask from home. 10 urinary retention  Improved off flomax, nausea resolved  LOS (Days) 9 A FACE TO FACE EVALUATION WAS PERFORMED  Ayan Heffington E 03/22/2012, 8:16 AM

## 2012-03-22 NOTE — Progress Notes (Signed)
Occupational Therapy Note  Patient Details  Name: James Bell MRN: [id number] Date of Birth: 1930/11/29 Today's Date: 03/22/2012  Time: 1130-1150 Pt denies pain Group Therapy Pt engaged in self feeding group with focus on attention to right, use of RUE as stabilizer, and following swallowing strategies.  Pt requires tot A for use of RUE as stabilizer.  Pt exhibits left gaze preference but will attend to right when spoken to.  Pt interacts appropriately with participants.  Pt requires occasional verbal cues to follow swallowing strategies.   Lavone Neri [facility] 03/22/2012, 3:28 PM

## 2012-03-22 NOTE — Progress Notes (Signed)
Physical Therapy Session Note  Patient Details  Name: James Bell MRN: [id number] Date of Birth: [birth year]  Today's Date: 03/22/2012      Short Term Goals: Week 2:  PT Short Term Goal 1 (Week 2): Patient will perform bed mobility and bed<>w/c transfers to L and R consistently with min A PT Short Term Goal 2 (Week 2): Patient will perform w/c mobility on unit with L hemi technique x 150 on unit min A PT Short Term Goal 3 (Week 2): Patient will perform standing pre gait and gait training with appropriate AD and orthosis x 25' max A  Skilled Therapeutic Interventions/Progress Updates:  1:2 -  9:50-10:45 3/10 shoulder pain, nursing made aware Pt propelled WC in room and on unit with assist needed x2 to prevent obstacle collision on R. Rather than redirecting WC, pt attempted to continue pushing into R arm on sink with unsafe technique. Verbal and visual cues needed for L foot rest management to remove for WC propulsion.  WC propulsion x150' in controlled environment with 2 rest breaks and Min A.   Stand-pivot transfer WC<>mat with Max A for steadying to prevent R LOB and assist with anterior translation over BOS. Pt able to perform set-up for transfer including preparing feet and removing arm rest.   Dynamic sitting balance with LLE elevated on 4" step to bias R weight-support. Pt performed reaching/placing cones outside BOS in all directions and from floor on R with occasional Min A to return to midline from R.  Re[eated sit<>stand from Mat to tray table and to RW x5 with Mod A for steadying due to R lean. Cues for tightening R knee and tucking hips with good adjustment to reach full upright position.   Static standing 2x78min with bil UE support and Min A for R knee control, limited by fatigue.  Step tap in RW to 4" step 2x10 with LLE for increased R LE extensor activation and knee control. Pt able to tighten quad with cues. Support given to limit hyperextension.  Pt requested to lay  down for a moment for therapeutic rest. Sit>supine with S only. Supine>sit with MinA.   Squat pivot WC<>toilet and WC> bed with Mod A and cues for anterior translation over BOS.   2:2 13:00-13:30 WC propulsion same as this morning, but with no obstacle collision and 1 rest break only.  Sit<>stand x3 to RW with verbal and visual cues for nose to RW bar with much improved anterior translation and Min A only needed. Cues for using mirror to maintain alignment during sit>stand, limiting R lean .  Stood at [employer] to play tic-tac-toe with wife 3x47min for static standing balance and Mod A to maintain alignment, R weight-shift and full trunk extension. Pt able to increase quad control with cues.      Therapy Documentation Precautions:  Precautions Precautions: Fall Restrictions Weight Bearing Restrictions: No     See FIM for current functional status  Therapy/Group: Individual Therapy  [name], PT 03/22/2012, 10:27 AM

## 2012-03-22 NOTE — Progress Notes (Signed)
Speech Language Pathology Daily Session Note [employer]  Patient Details  Name: James Bell MRN: [id number] Date of Birth: 1931-07-09  Today's Date: 03/22/2012 Time: 1150-1220 Time Calculation (min): 30 min  Short Term Goals: Week 2: SLP Short Term Goal 1 (Week 2): Pt. will consume regular diet with thin liquids with minimal s/s aspiration demonstrating swallow precautions with supervision level assist  SLP Short Term Goal 2 (Week 2): Pt. will utilize diaphragmatic breathing in conversation for increased vocal control and increased words per breath with min verbal assist. SLP Short Term Goal 3 (Week 2): Pt. will attend to upper right extremity during activities and in his environment with supervision verbal/visual cues. SLP Short Term Goal 4 (Week 2): Pt. will demonstrate appropriate executive functioning abilities via diagnostic assessment given supervision cues.  Skilled Therapeutic Interventions: Pt seen in [employer] with focus on utilization of swallowing compensatory strategies with regular textures and thin liquids. Pt demonstrated efficient mastication of regular textures but required Min visual and demonstration cues to appropriately use a chin tuck and head turn to the left. Pt with cough X 2 with thin liquids secondary trying to verbalize while drinking.    FIM:  Comprehension Comprehension: 5-Follows basic conversation/direction: With extra time/assistive device Expression Expression Mode: Verbal Expression: 4-Expresses basic 75 - 89% of the time/requires cueing 10 - 24% of the time. Needs helper to occlude trach/needs to repeat words. Social Interaction Social Interaction: 5-Interacts appropriately 90% of the time - Needs monitoring or encouragement for participation or interaction. Problem Solving Problem Solving: 5-Solves basic 90% of the time/requires cueing < 10% of the time Memory Memory: 5-Recognizes or recalls 90% of the time/requires cueing < 10% of the  time FIM - Eating Eating Activity: 5: Supervision/cues  Pain Pain Assessment Pain Assessment: No/denies pain  Therapy/Group: Dysphagia Group  Korrie Hofbauer 03/22/2012, 1:54 PM

## 2012-03-22 NOTE — Progress Notes (Signed)
Occupational Therapy Session Note  Patient Details  Name: James Bell MRN: [id number] Date of Birth: 11-28-1930  Today's Date: 03/22/2012 Time: 0730-0830 Time Calculation (min): 60 min  Short Term Goals: Week 1:  OT Short Term Goal 1 (Week 1): Patient will stand at sink using right upper extremity as support, with no more than min assist during bathing and dressing tasks. OT Short Term Goal 1 - Progress (Week 1): Progressing toward goal OT Short Term Goal 2 (Week 1): Patient will transfer to toilet with min assist  OT Short Term Goal 2 - Progress (Week 1): Met OT Short Term Goal 3 (Week 1): Patient will transfer to toilet with min assist  OT Short Term Goal 3 - Progress (Week 1): Met OT Short Term Goal 4 (Week 1): Patient will transfer to toilet with min assist  OT Short Term Goal 4 - Progress (Week 1): Met OT Short Term Goal 5 (Week 1): Patient will complete toileting with max assist  OT Short Term Goal 5 - Progress (Week 1): Met Week 2:  OT Short Term Goal 1 (Week 2): STG: Continue to progress towards LTGs.  Skilled Therapeutic Interventions/Progress Updates:  ADLre-training completed. Patient required min vc's for safety and hand placement during transfers. Patient needed min vc's to doff socks with contact guard assist for balance when sitting. Min vc's provided for technique to initiate hemi-dressing technique for lower body. Kinesiotape re-applied to RUE shoulder to assist in holding joint in position and to provide tactile input and increase proprioception and awareness of muscle and shoulder joint. 1 finger subluxation in right shoulder.  Vibration to RUE used to provide inhibitory input to promote contraction of proximal muscles.    Therapy Documentation Precautions:  Precautions Precautions: Fall Restrictions Weight Bearing Restrictions: No Pain: Pain Assessment Pain Assessment: No/denies pain  See FIM for current functional status  Therapy/Group: Individual  Therapy  Patina Spanier, Charisse March 03/22/2012, 11:10 AM

## 2012-03-22 NOTE — Progress Notes (Signed)
Pt. Stated that he would place himself on CPAP when ready for bed. Pt. Was made aware to call RT if he needed assistance.  

## 2012-03-22 NOTE — Progress Notes (Signed)
Per State Regulation 482.30 This chart was reviewed for medical necessity with respect to the patient's Admission/Duration of stay. Pt participating in therapies with slow progress. Voiding WNL. Adjusting insulin. Meryl Dare                 Nurse Care Manager            Next Review Date: 03/25/12

## 2012-03-23 LAB — GLUCOSE, CAPILLARY
Glucose-Capillary: 147 mg/dL — ABNORMAL HIGH (ref 70–99)
Glucose-Capillary: 219 mg/dL — ABNORMAL HIGH (ref 70–99)
Glucose-Capillary: 174 mg/dL — ABNORMAL HIGH (ref 70–99)
Glucose-Capillary: 199 mg/dL — ABNORMAL HIGH (ref 70–99)

## 2012-03-23 MED ORDER — ZOLPIDEM TARTRATE 5 MG PO TABS
5.0000 mg | ORAL_TABLET | Freq: Every evening | ORAL | Status: DC | PRN
  Administered 2012-03-23 – 2012-03-29 (×5): 5 mg via ORAL
  Filled 2012-03-23 (×5): qty 1

## 2012-03-23 NOTE — Progress Notes (Signed)
Speech Language Pathology Daily Session Note  Patient Details  Name: James Bell MRN: [id number] Date of Birth: Dec 22, 1930  Today's Date: 03/23/2012 Time: 4098-1191 Time Calculation (min): 25 min  Short Term Goals: Week 2: SLP Short Term Goal 1 (Week 2): Pt. will consume regular diet with thin liquids with minimal s/s aspiration demonstrating swallow precautions with supervision level assist  SLP Short Term Goal 2 (Week 2): Pt. will utilize diaphragmatic breathing in conversation for increased vocal control and increased words per breath with min verbal assist. SLP Short Term Goal 3 (Week 2): Pt. will attend to upper right extremity during activities and in his environment with supervision verbal/visual cues. SLP Short Term Goal 4 (Week 2): Pt. will demonstrate appropriate executive functioning abilities via diagnostic assessment given supervision cues.  Skilled Therapeutic Interventions: Group, co-treatment with OT; SLP facilitated session with supervision assist level semantic cues to utilize safe swallow compensatory strategies, for through chin tuck and turn to the left.  Patient demonstrated cough x3 during session due to incomplete use of technique and SLP attempts to fade cuing.  SLP suspects penetration occured; however a strong reflexive cough appeared to reduce penetrates.    FIM:  FIM - Eating Eating Activity: 5: Supervision/cues;5: Set-up assist for open containers  Pain Pain Assessment Pain Assessment: No/denies pain Pain Score: 0-No pain  Therapy/Group: Group Therapy  Charlane Ferretti., CCC-SLP [phone]  Orson Rho 03/23/2012, 3:16 PM

## 2012-03-23 NOTE — Progress Notes (Signed)
Occupational Therapy Session Note  Patient Details  Name: James Bell MRN: [id number] Date of Birth: 13-Jun-1931  Today's Date: 03/23/2012 Time: 1130-1150 Time Calculation (min): 20 min    Skilled Therapeutic Interventions/Progress Updates:    [employer]: Focus on recalling safe swallowing precautions, upright posture at table, use of left UE as a stabilizer, positioning of Lt UE during meal.  Therapy Documentation Precautions:  Precautions Precautions: Fall Restrictions Weight Bearing Restrictions: No General:   Vital Signs:   Pain: Pain Assessment Pain Assessment: No/denies pain Pain Score: 0-No pain  See FIM for current functional status  Therapy/Group: Group Therapy  Roney Mans [facility] 03/23/2012, 3:42 PM

## 2012-03-23 NOTE — Progress Notes (Signed)
Occupational Therapy Note  Patient Details  Name: James Bell MRN: [id number] Date of Birth: May 08, 1931 Today's Date: 03/23/2012  Pain:  None Time:  1300-1400  (60 min)   Group therapy  Pt. Engaged in neuromuscular re education of RUE in dominoes.  He required hand over Hand assistance for movements across the table.  Notice shoulder bicep trace movements  Humberto Seals 03/23/2012, 6:25 PM

## 2012-03-23 NOTE — Progress Notes (Signed)
Subjective/Complaints:  HPI: James Bell is a 78 y.o. right-handed male with history of diabetes mellitus and hyperlipidemia. 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 MRI revealed moderate size acute infarct affecting the left posterior putamen and left periventricular white matter. MRA of the head was unremarkable. Carotid Dopplers with no ICA stenosis. Echocardiogram with ejection fraction of 65% and grade 1 diastolic dysfunction. Neurology consulted placed on Plavix therapy. Patient was not a TPA candidate. Subcutaneous Lovenox added for DVT prophylaxis.  Nausea resolved. My BS are about what they are at home.  CBG 168 this am C/o insomnia  Review of Systems  Musculoskeletal: Positive for joint pain.       R shoulder  Neurological: Positive for speech change.   Objective: Vital Signs: Blood pressure 119/68, pulse 76, temperature 97.6 F (36.4 C), temperature source Oral, resp. rate 17, height 5\' 9"  (1.753 m), weight 160 lb 15 oz (73 kg), SpO2 97.00%. Dg Swallowing Func-no Report  03/21/2012  CLINICAL DATA: dysphagia   FLUOROSCOPY FOR SWALLOWING FUNCTION STUDY:  Fluoroscopy was provided for swallowing function study, which was  administered by a speech pathologist.  Final results and recommendations  from this study are contained within the speech pathology report.     Results for orders placed during the hospital encounter of 03/13/12 (from the past 72 hour(s))  GLUCOSE, CAPILLARY     Status: Abnormal   Collection Time   03/20/12 11:50 AM      Component Value Range Comment   Glucose-Capillary 224 (*) 70 - 99 mg/dL    Comment 1 Notify RN     GLUCOSE, CAPILLARY     Status: Abnormal   Collection Time   03/20/12  4:46 PM      Component Value Range Comment   Glucose-Capillary 152 (*) 70 - 99 mg/dL   GLUCOSE, CAPILLARY     Status: Abnormal   Collection Time   03/20/12  9:23 PM      Component Value Range Comment   Glucose-Capillary  168 (*) 70 - 99 mg/dL    Comment 1 Notify RN     GLUCOSE, CAPILLARY     Status: Abnormal   Collection Time   03/21/12  7:31 AM      Component Value Range Comment   Glucose-Capillary 153 (*) 70 - 99 mg/dL    Comment 1 Notify RN     GLUCOSE, CAPILLARY     Status: Abnormal   Collection Time   03/21/12 11:58 AM      Component Value Range Comment   Glucose-Capillary 183 (*) 70 - 99 mg/dL    Comment 1 Notify RN     GLUCOSE, CAPILLARY     Status: Abnormal   Collection Time   03/21/12  4:59 PM      Component Value Range Comment   Glucose-Capillary 134 (*) 70 - 99 mg/dL   GLUCOSE, CAPILLARY     Status: Abnormal   Collection Time   03/21/12  9:29 PM      Component Value Range Comment   Glucose-Capillary 190 (*) 70 - 99 mg/dL   GLUCOSE, CAPILLARY     Status: Abnormal   Collection Time   03/22/12  8:05 AM      Component Value Range Comment   Glucose-Capillary 168 (*) 70 - 99 mg/dL    Comment 1 Notify RN     GLUCOSE, CAPILLARY     Status: Abnormal   Collection Time   03/22/12 11:41 AM  Component Value Range Comment   Glucose-Capillary 188 (*) 70 - 99 mg/dL    Comment 1 Notify RN     GLUCOSE, CAPILLARY     Status: Abnormal   Collection Time   03/22/12  4:48 PM      Component Value Range Comment   Glucose-Capillary 132 (*) 70 - 99 mg/dL   GLUCOSE, CAPILLARY     Status: Abnormal   Collection Time   03/22/12  9:30 PM      Component Value Range Comment   Glucose-Capillary 191 (*) 70 - 99 mg/dL   GLUCOSE, CAPILLARY     Status: Abnormal   Collection Time   03/23/12  7:46 AM      Component Value Range Comment   Glucose-Capillary 147 (*) 70 - 99 mg/dL      HEENT: normal Cardio: RRR Resp: CTA B/L GI: BS positive Extremity:  No Edema Skin:   Intact Neuro: Alert/Oriented, Cranial Nerve Abnormalities R central VII, Normal Sensory, Abnormal Motor 2-/5 R shoulder protraction and Dysarthric,  RLE 2-/5 hipextension, knee ext synergy, 0/5 R ankle Musc/Skel:  No pain with shoulder  ROM  Assessment/Plan: 1. Functional deficits secondary to R putamen infarct with L HP, dysarthria and dysphagia which require 3+ hours per day of interdisciplinary therapy in a comprehensive inpatient rehab setting. Physiatrist is providing close team supervision and 24 hour management of active medical problems listed below. Physiatrist and rehab team continue to assess barriers to discharge/monitor patient progress toward functional and medical goals. FIM: FIM - Bathing Bathing Steps Patient Completed: Chest;Right Arm;Left Arm;Abdomen;Front perineal area;Buttocks;Right upper leg;Left upper leg;Right lower leg (including foot);Left lower leg (including foot) Bathing: 4: Min-Patient completes 8-9 55f 10 parts or 75+ percent  FIM - Upper Body Dressing/Undressing Upper body dressing/undressing steps patient completed: Thread/unthread right sleeve of pullover shirt/dresss;Thread/unthread left sleeve of pullover shirt/dress;Put head through opening of pull over shirt/dress;Pull shirt over trunk Upper body dressing/undressing: 5: Set-up assist to: Obtain clothing/put away FIM - Lower Body Dressing/Undressing Lower body dressing/undressing steps patient completed: Thread/unthread right underwear leg;Thread/unthread left underwear leg;Pull underwear up/down;Thread/unthread right pants leg;Thread/unthread left pants leg;Pull pants up/down;Don/Doff right shoe;Don/Doff left shoe;Fasten/unfasten right shoe;Fasten/unfasten left shoe Lower body dressing/undressing: 4: Min-Patient completed 75 plus % of tasks  FIM - Toileting Toileting steps completed by patient: Adjust clothing prior to toileting;Performs perineal hygiene;Adjust clothing after toileting Toileting Assistive Devices: Grab bar or rail for support Toileting: 4: Steadying assist  FIM - Diplomatic Services operational officer Devices: Grab bars;Elevated toilet seat Toilet Transfers: 3-To toilet/BSC: Mod A (lift or lower assist);3-From  toilet/BSC: Mod A (lift or lower assist)  FIM - Bed/Chair Transfer Bed/Chair Transfer Assistive Devices: Arm rests Bed/Chair Transfer: 4: Supine > Sit: Min A (steadying Pt. > 75%/lift 1 leg);4: Sit > Supine: Min A (steadying pt. > 75%/lift 1 leg);3: Bed > Chair or W/C: Mod A (lift or lower assist);3: Chair or W/C > Bed: Mod A (lift or lower assist)  FIM - Locomotion: Wheelchair Distance: 150 Locomotion: Wheelchair: 4: Travels 150 ft or more: maneuvers on rugs and over door sillls with minimal assistance (Pt.>75%) FIM - Locomotion: Ambulation Ambulation/Gait Assistance: 1: +2 Total assist Locomotion: Ambulation: 0: Activity did not occur  Comprehension Comprehension Mode: Auditory Comprehension: 5-Understands basic 90% of the time/requires cueing < 10% of the time  Expression Expression Mode: Verbal Expression: 5-Expresses basic 90% of the time/requires cueing < 10% of the time.  Social Interaction Social Interaction: 5-Interacts appropriately 90% of the time - Needs monitoring or  encouragement for participation or interaction.  Problem Solving Problem Solving: 5-Solves basic problems: With no assist  Memory Memory: 5-Recognizes or recalls 90% of the time/requires cueing < 10% of the time  Medical Problem List and Plan:  1. 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 DVT Prophylaxis/Anticoagulation: Subcutaneous Lovenox. Monitor platelet counts and any signs of bleeding  3. Dysphagia. Mechanical soft with nectar liquids. Monitor for any signs of aspiration. Speech therapy to followup  4. Neuropsych: This patient is capable of making decisions on his/her own behalf.  5. Diabetes mellitus.Uncontrolled Hemoglobin A1c 6.3.Increase Lantus insulin 45 units each bedtime. Patient on 45 units Lantus prior to admission as well as janumet 50-100 twice a day. Check blood sugars a.c. and at bedtime. Pt would rather try a higher dose of Lantus than restart  oral agents.  Increase to 50Units 6. Hypertension. Lisinopril 10 mg daily. Monitor with increased mobility  7. Hyperlipidemia. Lipitor  8. Restless leg syndrome. Continue Mirapex 1 mg daily  9. Obstructive sleep apnea. CPAP. Patient has been using a CPAP machine for approximately 2 years. He is using his face mask from home. 10. urinary retention  Improved off flomax, nausea resolved 11. Insomnia. Zolpidem prn  LOS (Days) 10 A FACE TO FACE EVALUATION WAS PERFORMED  Alex Tyronn Golda 03/23/2012, 8:28 AM

## 2012-03-24 LAB — GLUCOSE, CAPILLARY
Glucose-Capillary: 140 mg/dL — ABNORMAL HIGH (ref 70–99)
Glucose-Capillary: 128 mg/dL — ABNORMAL HIGH (ref 70–99)
Glucose-Capillary: 173 mg/dL — ABNORMAL HIGH (ref 70–99)
Glucose-Capillary: 167 mg/dL — ABNORMAL HIGH (ref 70–99)

## 2012-03-24 NOTE — Progress Notes (Signed)
Occupational Therapy Note  Patient Details  Name: [name] MRN: [id number] Date of Birth: 01-Nov-1930 Today's Date: 03/24/2012  Time:  1400-1500  (60 min) Pain:  None Group Therapy  Engaged in RUE neuromuscular re education using stabilizing exercises around glenohumeral, scapulohumeral joints.   Pt. Sat and engaged in weight bearing activities with RUE on OT's knee.  Pt stood with moderate assist and maintained standing balance with mod a.     Humberto Seals 03/24/2012, 9:58 AM

## 2012-03-24 NOTE — Progress Notes (Signed)
RT Note: pt placed himself on CPAP. RT will monitor.

## 2012-03-24 NOTE — Progress Notes (Signed)
Patient ID: James Bell, male   DOB: [birth year], 78 y.o.   MRN: [id number] Subjective/Complaints:  HPI: James Bell is a 78 y.o. right-handed male with history of diabetes mellitus and hyperlipidemia. 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 MRI revealed moderate size acute infarct affecting the left posterior putamen and left periventricular white matter. MRA of the head was unremarkable. Carotid Dopplers with no ICA stenosis. Echocardiogram with ejection fraction of 65% and grade 1 diastolic dysfunction. Neurology consulted placed on Plavix therapy. Patient was not a TPA candidate. Subcutaneous Lovenox added for DVT prophylaxis.  Nausea resolved. My BS are about what they are at home.  CBG 168 this am C/o insomnia - slept well on zolpidem  Review of Systems  Musculoskeletal: Positive for joint pain.       R shoulder  Neurological: Positive for speech change.   Objective: Vital Signs: Blood pressure 126/71, pulse 75, temperature 98.1 F (36.7 C), temperature source Oral, resp. rate 18, height 5\' 9"  (1.753 m), weight 160 lb 15 oz (73 kg), SpO2 94.00%. No results found. Results for orders placed during the hospital encounter of 03/13/12 (from the past 72 hour(s))  GLUCOSE, CAPILLARY     Status: Abnormal   Collection Time   03/21/12 11:58 AM      Component Value Range Comment   Glucose-Capillary 183 (*) 70 - 99 mg/dL    Comment 1 Notify RN     GLUCOSE, CAPILLARY     Status: Abnormal   Collection Time   03/21/12  4:59 PM      Component Value Range Comment   Glucose-Capillary 134 (*) 70 - 99 mg/dL   GLUCOSE, CAPILLARY     Status: Abnormal   Collection Time   03/21/12  9:29 PM      Component Value Range Comment   Glucose-Capillary 190 (*) 70 - 99 mg/dL   GLUCOSE, CAPILLARY     Status: Abnormal   Collection Time   03/22/12  8:05 AM      Component Value Range Comment   Glucose-Capillary 168 (*) 70 - 99 mg/dL    Comment 1 Notify  RN     GLUCOSE, CAPILLARY     Status: Abnormal   Collection Time   03/22/12 11:41 AM      Component Value Range Comment   Glucose-Capillary 188 (*) 70 - 99 mg/dL    Comment 1 Notify RN     GLUCOSE, CAPILLARY     Status: Abnormal   Collection Time   03/22/12  4:48 PM      Component Value Range Comment   Glucose-Capillary 132 (*) 70 - 99 mg/dL   GLUCOSE, CAPILLARY     Status: Abnormal   Collection Time   03/22/12  9:30 PM      Component Value Range Comment   Glucose-Capillary 191 (*) 70 - 99 mg/dL   GLUCOSE, CAPILLARY     Status: Abnormal   Collection Time   03/23/12  7:46 AM      Component Value Range Comment   Glucose-Capillary 147 (*) 70 - 99 mg/dL   GLUCOSE, CAPILLARY     Status: Abnormal   Collection Time   03/23/12 11:30 AM      Component Value Range Comment   Glucose-Capillary 219 (*) 70 - 99 mg/dL   GLUCOSE, CAPILLARY     Status: Abnormal   Collection Time   03/23/12  4:57 PM      Component Value Range Comment  Glucose-Capillary 174 (*) 70 - 99 mg/dL   GLUCOSE, CAPILLARY     Status: Abnormal   Collection Time   03/23/12  9:01 PM      Component Value Range Comment   Glucose-Capillary 199 (*) 70 - 99 mg/dL   GLUCOSE, CAPILLARY     Status: Abnormal   Collection Time   03/24/12  7:26 AM      Component Value Range Comment   Glucose-Capillary 140 (*) 70 - 99 mg/dL      HEENT: normal Cardio: RRR Resp: CTA B/L GI: BS positive Extremity:  No Edema Skin:   Intact Neuro: Alert/Oriented, Cranial Nerve Abnormalities R central VII, Normal Sensory, Abnormal Motor 2-/5 R shoulder protraction and Dysarthric,  RLE 2-/5 hipextension, knee ext synergy, 0/5 R ankle Musc/Skel:  No pain with shoulder ROM  Assessment/Plan: 1. Functional deficits secondary to R putamen infarct with L HP, dysarthria and dysphagia which require 3+ hours per day of interdisciplinary therapy in a comprehensive inpatient rehab setting. Physiatrist is providing close team supervision and 24 hour management of active  medical problems listed below. Physiatrist and rehab team continue to assess barriers to discharge/monitor patient progress toward functional and medical goals. FIM: FIM - Bathing Bathing Steps Patient Completed: Right Arm;Left Arm;Chest;Abdomen;Front perineal area;Buttocks;Right upper leg;Left upper leg Bathing: 4: Min-Patient completes 8-9 35f 10 parts or 75+ percent  FIM - Upper Body Dressing/Undressing Upper body dressing/undressing steps patient completed: Thread/unthread right sleeve of pullover shirt/dresss;Put head through opening of pull over shirt/dress;Pull shirt over trunk Upper body dressing/undressing: 4: Min-Patient completed 75 plus % of tasks FIM - Lower Body Dressing/Undressing Lower body dressing/undressing steps patient completed: Thread/unthread left underwear leg;Pull underwear up/down;Thread/unthread left pants leg;Pull pants up/down Lower body dressing/undressing: 3: Mod-Patient completed 50-74% of tasks  FIM - Toileting Toileting steps completed by patient: Adjust clothing prior to toileting;Performs perineal hygiene;Adjust clothing after toileting Toileting Assistive Devices: Grab bar or rail for support Toileting: 4: Steadying assist  FIM - Diplomatic Services operational officer Devices: Grab bars;Elevated toilet seat Toilet Transfers: 3-From toilet/BSC: Mod A (lift or lower assist)  FIM - Banker Devices: Arm rests Bed/Chair Transfer: 4: Chair or W/C > Bed: Min A (steadying Pt. > 75%);4: Bed > Chair or W/C: Min A (steadying Pt. > 75%);4: Sit > Supine: Min A (steadying pt. > 75%/lift 1 leg);4: Supine > Sit: Min A (steadying Pt. > 75%/lift 1 leg)  FIM - Locomotion: Wheelchair Distance: 150 Locomotion: Wheelchair: 4: Travels 150 ft or more: maneuvers on rugs and over door sillls with minimal assistance (Pt.>75%) FIM - Locomotion: Ambulation Ambulation/Gait Assistance: 1: +2 Total assist Locomotion: Ambulation: 0:  Activity did not occur  Comprehension Comprehension Mode: Auditory Comprehension: 6-Follows complex conversation/direction: With extra time/assistive device  Expression Expression Mode: Verbal Expression: 6-Expresses complex ideas: With extra time/assistive device  Social Interaction Social Interaction: 6-Interacts appropriately with others with medication or extra time (anti-anxiety, antidepressant).  Problem Solving Problem Solving: 6-Solves complex problems: With extra time  Memory Memory: 6-More than reasonable amt of time  Medical Problem List and Plan:  1. 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 DVT Prophylaxis/Anticoagulation: Subcutaneous Lovenox. Monitor platelet counts and any signs of bleeding  3. Dysphagia. Mechanical soft with nectar liquids. Monitor for any signs of aspiration. Speech therapy to followup  4. Neuropsych: This patient is capable of making decisions on his/her own behalf.  5. Diabetes mellitus.Uncontrolled Hemoglobin A1c 6.3.Increase Lantus insulin 45 units each bedtime.  Patient on 45 units Lantus prior to admission as well as janumet 50-100 twice a day. Check blood sugars a.c. and at bedtime. Pt would rather try a higher dose of Lantus than restart oral agents.  Increase to 50Units 6. Hypertension. Lisinopril 10 mg daily. Monitor with increased mobility  7. Hyperlipidemia. Lipitor  8. Restless leg syndrome. Continue Mirapex 1 mg daily  9. Obstructive sleep apnea. CPAP. Patient has been using a CPAP machine for approximately 2 years. He is using his face mask from home. 10. urinary retention  Improved off flomax, nausea resolved 11. Insomnia. Zolpidem prn is helping  LOS (Days) 11 A FACE TO FACE EVALUATION WAS PERFORMED  Alex Zykeria Laguardia 03/24/2012, 9:04 AM

## 2012-03-25 LAB — GLUCOSE, CAPILLARY
Glucose-Capillary: 108 mg/dL — ABNORMAL HIGH (ref 70–99)
Glucose-Capillary: 125 mg/dL — ABNORMAL HIGH (ref 70–99)
Glucose-Capillary: 152 mg/dL — ABNORMAL HIGH (ref 70–99)
Glucose-Capillary: 91 mg/dL (ref 70–99)

## 2012-03-25 MED ORDER — DOCUSATE SODIUM 100 MG PO CAPS
100.0000 mg | ORAL_CAPSULE | Freq: Two times a day (BID) | ORAL | Status: DC
  Administered 2012-03-25 – 2012-03-29 (×10): 100 mg via ORAL
  Filled 2012-03-25 (×14): qty 1

## 2012-03-25 NOTE — Progress Notes (Signed)
Speech Language Pathology Daily Session Note [facility]  Patient Details  Name: James Bell MRN: [id number] Date of Birth: 1931/08/16  Today's Date: 03/25/2012 Time: 1150-1220 Time Calculation (min): 30 min  Short Term Goals: Week 2: SLP Short Term Goal 1 (Week 2): Pt. will consume regular diet with thin liquids with minimal s/s aspiration demonstrating swallow precautions with supervision level assist  SLP Short Term Goal 2 (Week 2): Pt. will utilize diaphragmatic breathing in conversation for increased vocal control and increased words per breath with min verbal assist. SLP Short Term Goal 3 (Week 2): Pt. will attend to upper right extremity during activities and in his environment with supervision verbal/visual cues. SLP Short Term Goal 4 (Week 2): Pt. will demonstrate appropriate executive functioning abilities via diagnostic assessment given supervision cues.  Skilled Therapeutic Interventions: Pt seen in [employer] with treatment focus on utilization of swallowing compensatory strategies with regular textures and thin liquids. Pt required Min A verbal and questioning cues to utilize small bites, throat clear after every sip of liquid and head tilt and chin down with thin liquids. Pt without overt s/s of aspiration.    FIM:  Comprehension Comprehension: 7-Follows complex conversation/direction: With no assist Expression Expression Mode: Verbal Expression: 6-Expresses complex ideas: With extra time/assistive device Social Interaction Social Interaction: 6-Interacts appropriately with others with medication or extra time (anti-anxiety, antidepressant). Problem Solving Problem Solving: 6-Solves complex problems: With extra time Memory Memory: 6-More than reasonable amt of time FIM - Eating Eating Activity: 5: Supervision/cues  Pain Pain Assessment Pain Assessment: No/denies pain Pain Score: 0-No pain  Therapy/Group: Dysphagia Group  Kamara Allan 03/25/2012, 1:21  PM

## 2012-03-25 NOTE — Care Management Note (Signed)
Received call from pt's daughter. She reports that she talked with pt about discharging to SNF from [facility] and that pt agreed that he would require long term rehab. SW to send out FL2s to requested facilities.

## 2012-03-25 NOTE — Progress Notes (Signed)
Per State Regulation 482.30 This chart was reviewed for medical necessity with respect to the patient's Admission/Duration of stay. Pt participating with slow steady progress. Voiding WNL. No I&O cath needed since 7/2. Meryl Dare                 Nurse Care Manager            Next Review Date: 03/28/12

## 2012-03-25 NOTE — Progress Notes (Signed)
Physical Therapy Session Note  Patient Details  Name: James Bell MRN: [id number] Date of Birth: 1930/12/25  Today's Date: 03/25/2012 Time: 4098-1191 Time Calculation (min): 45 min  Short Term Goals: Week 2:  PT Short Term Goal 1 (Week 2): Patient will perform bed mobility and bed<>w/c transfers to L and R consistently with min A PT Short Term Goal 2 (Week 2): Patient will perform w/c mobility on unit with L hemi technique x 150 on unit min A PT Short Term Goal 3 (Week 2): Patient will perform standing pre gait and gait training with appropriate AD and orthosis x 25' max A  Therapy Documentation Precautions:  Precautions Precautions: Fall Restrictions Weight Bearing Restrictions: No Pain: Pain Assessment Pain Assessment: No/denies pain; patient reports that pain in shoulder improved with Kinesiotaping Pain Score: 0-No pain Other Treatments: Treatments Therapeutic Activity: Reviewed w/c set up and transfer sequence with patient; patient able to set up w/c and prepare for transfer with one cue to remove arm rest; squat pivot to L with mod A to maintain hip elevation during full pivot but patient able to maintain erect trunk and no LOB to R during transfer.  Sit <> stand training on elevated mat with bilat UE on RLE pushing through RLE for increased core and RLE activation with verbal and tactile cues for trunk elongation during anterior lean and cues for RLE extensor activation during sit > stand to maintain trunk in midline and minimize pushing to the R; progressed from max A to min A but still with verbal and tactile cues for sequencing and to slow down Neuromuscular Facilitation: Right;Lower Extremity;Upper Extremity;Forced use;Activity to increase timing and sequencing;Activity to increase sustained activation;Activity to increase lateral weight shifting;Activity to increase anterior-posterior weight shifting during sitting on mat without back support with RUE and RLE touching  surfaces but LLE off the floor to increase weight shift and activation of R side during dynamic reaching and trunk control/righting training out of BOS and across midline with no LOB to the R and improved head and trunk righting strategies.  In standing with LUE support during R lateral weight shifts and activation of RLE extensors and hip stabilization in single limb stance during LLE toe taps to 4" step with tactile cues for LE and trunk muscle activation to maintain upright trunk and hip extension.  See FIM for current functional status  Therapy/Group: Individual Therapy  Edman [facility] 03/25/2012, 12:35 PM

## 2012-03-25 NOTE — Plan of Care (Signed)
Problem: RH BOWEL ELIMINATION Goal: Bowel movement every 2 days Outcome: Not Progressing Started on colace BID

## 2012-03-25 NOTE — Progress Notes (Signed)
Patient ID: James Bell, male   DOB: 1931/04/17, 78 y.o.   MRN: [id number] Subjective/Complaints:  HPI: James Bell is a 78 y.o. right-handed male with history of diabetes mellitus and hyperlipidemia. 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 MRI revealed moderate size acute infarct affecting the left posterior putamen and left periventricular white matter. MRA of the head was unremarkable. Carotid Dopplers with no ICA stenosis. Echocardiogram with ejection fraction of 65% and grade 1 diastolic dysfunction. Neurology consulted placed on Plavix therapy. Patient was not a TPA candidate. Subcutaneous Lovenox added for DVT prophylaxis. Hard stools but does feel constipated Review of Systems  Musculoskeletal: Positive for joint pain.       R shoulder  Neurological: Positive for speech change.   Objective: Vital Signs: Blood pressure 153/76, pulse 78, temperature 97.9 F (36.6 C), temperature source Oral, resp. rate 18, height 5\' 9"  (1.753 m), weight 73 kg (160 lb 15 oz), SpO2 100.00%. No results found. Results for orders placed during the hospital encounter of 03/13/12 (from the past 72 hour(s))  GLUCOSE, CAPILLARY     Status: Abnormal   Collection Time   03/22/12 11:41 AM      Component Value Range Comment   Glucose-Capillary 188 (*) 70 - 99 mg/dL    Comment 1 Notify RN     GLUCOSE, CAPILLARY     Status: Abnormal   Collection Time   03/22/12  4:48 PM      Component Value Range Comment   Glucose-Capillary 132 (*) 70 - 99 mg/dL   GLUCOSE, CAPILLARY     Status: Abnormal   Collection Time   03/22/12  9:30 PM      Component Value Range Comment   Glucose-Capillary 191 (*) 70 - 99 mg/dL   GLUCOSE, CAPILLARY     Status: Abnormal   Collection Time   03/23/12  7:46 AM      Component Value Range Comment   Glucose-Capillary 147 (*) 70 - 99 mg/dL   GLUCOSE, CAPILLARY     Status: Abnormal   Collection Time   03/23/12 11:30 AM      Component  Value Range Comment   Glucose-Capillary 219 (*) 70 - 99 mg/dL   GLUCOSE, CAPILLARY     Status: Abnormal   Collection Time   03/23/12  4:57 PM      Component Value Range Comment   Glucose-Capillary 174 (*) 70 - 99 mg/dL   GLUCOSE, CAPILLARY     Status: Abnormal   Collection Time   03/23/12  9:01 PM      Component Value Range Comment   Glucose-Capillary 199 (*) 70 - 99 mg/dL   GLUCOSE, CAPILLARY     Status: Abnormal   Collection Time   03/24/12  7:26 AM      Component Value Range Comment   Glucose-Capillary 140 (*) 70 - 99 mg/dL   GLUCOSE, CAPILLARY     Status: Abnormal   Collection Time   03/24/12 11:50 AM      Component Value Range Comment   Glucose-Capillary 128 (*) 70 - 99 mg/dL   GLUCOSE, CAPILLARY     Status: Abnormal   Collection Time   03/24/12  4:45 PM      Component Value Range Comment   Glucose-Capillary 173 (*) 70 - 99 mg/dL   GLUCOSE, CAPILLARY     Status: Abnormal   Collection Time   03/24/12  9:12 PM      Component Value Range  Comment   Glucose-Capillary 167 (*) 70 - 99 mg/dL   GLUCOSE, CAPILLARY     Status: Abnormal   Collection Time   03/25/12  8:06 AM      Component Value Range Comment   Glucose-Capillary 108 (*) 70 - 99 mg/dL    Comment 1 Notify RN        HEENT: normal Cardio: RRR Resp: CTA B/L GI: BS positive Extremity:  No Edema Skin:   Intact Neuro: Alert/Oriented, Cranial Nerve Abnormalities R central VII, Normal Sensory, Abnormal Motor 2-/5 R shoulder protraction and Dysarthric,  RLE 2-/5 hipextension, knee ext synergy, 0/5 R ankle Musc/Skel:  No pain with shoulder ROM  Assessment/Plan: 1. Functional deficits secondary to R putamen infarct with L HP, dysarthria and dysphagia which require 3+ hours per day of interdisciplinary therapy in a comprehensive inpatient rehab setting. Physiatrist is providing close team supervision and 24 hour management of active medical problems listed below. Physiatrist and rehab team continue to assess barriers to  discharge/monitor patient progress toward functional and medical goals. FIM: FIM - Bathing Bathing Steps Patient Completed: Right Arm;Left Arm;Chest;Abdomen;Front perineal area;Buttocks;Right upper leg;Left upper leg Bathing: 4: Min-Patient completes 8-9 55f 10 parts or 75+ percent  FIM - Upper Body Dressing/Undressing Upper body dressing/undressing steps patient completed: Thread/unthread right sleeve of pullover shirt/dresss;Put head through opening of pull over shirt/dress;Pull shirt over trunk Upper body dressing/undressing: 4: Min-Patient completed 75 plus % of tasks FIM - Lower Body Dressing/Undressing Lower body dressing/undressing steps patient completed: Thread/unthread left underwear leg;Pull underwear up/down;Thread/unthread left pants leg;Pull pants up/down Lower body dressing/undressing: 3: Mod-Patient completed 50-74% of tasks  FIM - Toileting Toileting steps completed by patient: Performs perineal hygiene;Adjust clothing after toileting;Adjust clothing prior to toileting Toileting Assistive Devices: Grab bar or rail for support Toileting: 4: Steadying assist  FIM - Diplomatic Services operational officer Devices: Grab bars Toilet Transfers: 3-To toilet/BSC: Mod A (lift or lower assist);3-From toilet/BSC: Mod A (lift or lower assist)  FIM - Bed/Chair Transfer Bed/Chair Transfer Assistive Devices: Arm rests Bed/Chair Transfer: 4: Supine > Sit: Min A (steadying Pt. > 75%/lift 1 leg);4: Sit > Supine: Min A (steadying pt. > 75%/lift 1 leg);4: Chair or W/C > Bed: Min A (steadying Pt. > 75%);4: Bed > Chair or W/C: Min A (steadying Pt. > 75%)  FIM - Locomotion: Wheelchair Distance: 150 Locomotion: Wheelchair: 4: Travels 150 ft or more: maneuvers on rugs and over door sillls with minimal assistance (Pt.>75%) FIM - Locomotion: Ambulation Ambulation/Gait Assistance: 3: Mod assist Locomotion: Ambulation: 0: Activity did not occur  Comprehension Comprehension Mode:  Auditory Comprehension: 6-Follows complex conversation/direction: With extra time/assistive device  Expression Expression Mode: Verbal Expression: 6-Expresses complex ideas: With extra time/assistive device  Social Interaction Social Interaction: 6-Interacts appropriately with others with medication or extra time (anti-anxiety, antidepressant).  Problem Solving Problem Solving: 6-Solves complex problems: With extra time  Memory Memory: 6-More than reasonable amt of time  Medical Problem List and Plan:  1. 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 DVT Prophylaxis/Anticoagulation: Subcutaneous Lovenox. Monitor platelet counts and any signs of bleeding  3. Dysphagia. Mechanical soft with nectar liquids. Monitor for any signs of aspiration. Speech therapy to followup  4. Neuropsych: This patient is capable of making decisions on his/her own behalf.  5. Diabetes mellitus.Uncontrolled Hemoglobin A1c 6.3.Increase Lantus insulin 45 units each bedtime. Patient on 45 units Lantus prior to admission as well as janumet 50-100 twice a day. Check blood sugars a.c. and at  bedtime. Pt would rather try a higher dose of Lantus than restart oral agents.  Increase to 50Units.  Add CHO mod diet 6. Hypertension. Lisinopril 10 mg daily. Monitor with increased mobility  7. Hyperlipidemia. Lipitor  8. Restless leg syndrome. Continue Mirapex 1 mg daily  9. Obstructive sleep apnea. CPAP. Patient has been using a CPAP machine for approximately 2 years. He is using his face mask from home. 10. urinary retention  Improved off flomax, nausea resolved 11. Insomnia. Zolpidem prn is helping  LOS (Days) 12 A FACE TO FACE EVALUATION WAS PERFORMED  Jashad Depaula E 03/25/2012, 8:15 AM

## 2012-03-25 NOTE — Progress Notes (Signed)
Speech Language Pathology Daily Session Note  Patient Details  Name: James Bell MRN: [id number] Date of Birth: 1931/03/21  Today's Date: 03/25/2012 Time: 7829-5621 Time Calculation (min): 45 min  Short Term Goals: Week 2: SLP Short Term Goal 1 (Week 2): Pt. will consume regular diet with thin liquids with minimal s/s aspiration demonstrating swallow precautions with supervision level assist  SLP Short Term Goal 2 (Week 2): Pt. will utilize diaphragmatic breathing in conversation for increased vocal control and increased words per breath with min verbal assist. SLP Short Term Goal 3 (Week 2): Pt. will attend to upper right extremity during activities and in his environment with supervision verbal/visual cues. SLP Short Term Goal 4 (Week 2): Pt. will demonstrate appropriate executive functioning abilities via diagnostic assessment given supervision cues.  Skilled Therapeutic Interventions: Treatment session focused on utilizing diaphragmatic breathing during converastion with supervision semantic cues to increasae vocal intensity and move hand away from mouth.  Patient consumed sips of water via cup throughout session with mod I use of strategies and no overt s/s of aspiration.     FIM:  Comprehension Comprehension: 7-Follows complex conversation/direction: With no assist Expression Expression Mode: Verbal Expression: 6-Expresses complex ideas: With extra time/assistive device Social Interaction Social Interaction: 6-Interacts appropriately with others with medication or extra time (anti-anxiety, antidepressant). Problem Solving Problem Solving: 6-Solves complex problems: With extra time Memory Memory: 6-More than reasonable amt of time  Pain Pain Assessment Pain Assessment: No/denies pain Pain Score: 0-No pain  Therapy/Group: Individual Therapy  Charlane Ferretti., CCC-SLP [phone]  Paschal Blanton 03/25/2012, 12:22 PM

## 2012-03-25 NOTE — Progress Notes (Signed)
Occupational Therapy Session Note  Patient Details  Name: James Bell MRN: [id number] Date of Birth: Nov 04, 1930  Today's Date: 03/25/2012 Time: 0730-0830 Time Calculation (min): 60 min  Short Term Goals: Week 1:  OT Short Term Goal 1 (Week 1): Patient will stand at sink using right upper extremity as support, with no more than min assist during bathing and dressing tasks. OT Short Term Goal 1 - Progress (Week 1): Progressing toward goal OT Short Term Goal 2 (Week 1): Patient will transfer to toilet with min assist  OT Short Term Goal 2 - Progress (Week 1): Met OT Short Term Goal 3 (Week 1): Patient will transfer to toilet with min assist  OT Short Term Goal 3 - Progress (Week 1): Met OT Short Term Goal 4 (Week 1): Patient will transfer to toilet with min assist  OT Short Term Goal 4 - Progress (Week 1): Met OT Short Term Goal 5 (Week 1): Patient will complete toileting with max assist  OT Short Term Goal 5 - Progress (Week 1): Met Week 2:  OT Short Term Goal 1 (Week 2): STG: Continue to progress towards LTGs.  Skilled Therapeutic Interventions/Progress Updates:  ADL re-training performed this AM. Session with focus on functional transfers, bathing, dressing, and safety awareness during morning routine. Patient required min vc's for set-up during transfers. Patient experienced 1 LOB while sitting in shower attempting to bend down and wash feet. Patient will be given long handled sponge next ADL to use. No vc's required for hemi-dressing technique. Patient did need vc's to stand up straight when standing to pull pants up over hips. -RUE P/ROM performed; 10 reps; 1 set; all ranges as tolerated.  -10 bil shoulder shrugs performed with vc's; 1 set. - Patient performed self ROM with vc's from therapist for technique and form; shoulder flexion/extention; scapular protraction; 5 reps; 1 set.  Therapy Documentation Precautions:  Precautions Precautions: Fall Restrictions Weight Bearing  Restrictions: No Pain: Pain Assessment Pain Assessment: No/denies pain her Treatments:    See FIM for current functional status  Therapy/Group: Individual Therapy  [name], Charisse March 03/25/2012, 11:11 AM

## 2012-03-25 NOTE — Progress Notes (Signed)
Occupational Therapy Note  Patient Details  Name: James Bell MRN: [id number] Date of Birth: 06-04-1931 Today's Date: 03/25/2012  Time: 1130-1150 Pt denies pain Group Therapy Pt participated in self-feeding group with focus on RUE use as stabilizer and following swallowing strategies.  Pt requires tot A to use RUE as stabilizer when removing cover from food containers.  Pt requires min verbal cues for swallowing strategies (head tilt with chin tuck when swallowing liquids).   Lavone Neri [facility] 03/25/2012, 1:15 PM

## 2012-03-25 NOTE — Progress Notes (Signed)
Physical Therapy Note  Patient Details  Name: James Bell MRN: [id number] Date of Birth: 1930/10/29 Today's Date: 03/25/2012  1500-1525 (25 minutes) individual Pain: no complaint of pain Focus of treatment: Gait training with Lite Gait for forced use of LT LE to facilitate knee extension in stance Treatment: Lite Gait 12-15 feet X 2 with +2 assist. Pt required ace wrap Lt ankle, min assist to prevent scissoring during swing on left and vcs to initiate terminal knee extension in stance (inconsistent).    Mada Sadik,[name] 03/25/2012, 3:22 PM

## 2012-03-26 DIAGNOSIS — G8110 Spastic hemiplegia affecting unspecified side: Secondary | ICD-10-CM

## 2012-03-26 DIAGNOSIS — Z5189 Encounter for other specified aftercare: Secondary | ICD-10-CM

## 2012-03-26 DIAGNOSIS — I6330 Cerebral infarction due to thrombosis of unspecified cerebral artery: Secondary | ICD-10-CM

## 2012-03-26 LAB — GLUCOSE, CAPILLARY
Glucose-Capillary: 87 mg/dL (ref 70–99)
Glucose-Capillary: 120 mg/dL — ABNORMAL HIGH (ref 70–99)
Glucose-Capillary: 111 mg/dL — ABNORMAL HIGH (ref 70–99)
Glucose-Capillary: 133 mg/dL — ABNORMAL HIGH (ref 70–99)

## 2012-03-26 MED ORDER — HYDROCODONE-ACETAMINOPHEN 5-325 MG PO TABS
1.0000 | ORAL_TABLET | Freq: Every evening | ORAL | Status: DC | PRN
  Administered 2012-03-26: 1 via ORAL
  Filled 2012-03-26: qty 1

## 2012-03-26 NOTE — Progress Notes (Signed)
Occupational Therapy Note  Patient Details  Name: James Bell MRN: [id number] Date of Birth: 30-May-1931 Today's Date: 03/26/2012  Time: 1130-1145 Pt denies pain Group Therapy Pt participated in self feeding group with focus on attention to right and using RUE as stabilizer for opening containers.  Pt exhibiting increased tone in RUE but does not c/o pain.  Pt following swallowing strategies with occasional verbal cues.   Lavone Neri [facility] 03/26/2012, 3:13 PM

## 2012-03-26 NOTE — Progress Notes (Signed)
Pt refuses to wear CPAP tonight. Pt states he does not tolerate it well and that is does not feel like his machine at home. Pt has home mask but was still unable to tolerate it the previous nights. Pt encouraged to call RT if pt changes mind. No distress at this time.

## 2012-03-26 NOTE — Progress Notes (Signed)
Speech Language Pathology Daily Session Note [employer]  Patient Details  Name: James Bell MRN: [id number] Date of Birth: [birth year]  Today's Date: 03/26/2012 Time: 7829-5621 Time Calculation (min): 30 min  Short Term Goals: Week 2: SLP Short Term Goal 1 (Week 2): Pt. will consume regular diet with thin liquids with minimal s/s aspiration demonstrating swallow precautions with supervision level assist  SLP Short Term Goal 2 (Week 2): Pt. will utilize diaphragmatic breathing in conversation for increased vocal control and increased words per breath with min verbal assist. SLP Short Term Goal 3 (Week 2): Pt. will attend to upper right extremity during activities and in his environment with supervision verbal/visual cues. SLP Short Term Goal 4 (Week 2): Pt. will demonstrate appropriate executive functioning abilities via diagnostic assessment given supervision cues.  Skilled Therapeutic Interventions: Pt seen in [employer] with treatment focus on utilization of swallowing compensatory strategies with regular textures and thin liquids. Pt required supervision verbal and questioning cues to utilize small bites, throat clear after every sip of liquid and head tilt and chin down with thin liquids. Pt with cough X 1 during meal, suspect due to talking while oral cavity is full.     FIM:  Comprehension Comprehension Mode: Auditory Comprehension: 7-Follows complex conversation/direction: With no assist Expression Expression Mode: Verbal Expression: 6-Expresses complex ideas: With extra time/assistive device Social Interaction Social Interaction: 6-Interacts appropriately with others with medication or extra time (anti-anxiety, antidepressant). Problem Solving Problem Solving: 6-Solves complex problems: With extra time Memory Memory: 6-Assistive device: No helper FIM - Eating Eating Activity: 5: Supervision/cues  Pain Pain Assessment Pain Assessment: No/denies pain Pain Score:  0-No pain  Therapy/Group: Dysphagia Group  Renne Platts 03/26/2012, 2:42 PM

## 2012-03-26 NOTE — Progress Notes (Signed)
Patient ID: James Bell, male   DOB: 1931-07-30, 78 y.o.   MRN: [id number] Subjective/Complaints:  HPI: James Bell is a 78 y.o. right-handed male with history of diabetes mellitus and hyperlipidemia. 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 MRI revealed moderate size acute infarct affecting the left posterior putamen and left periventricular white matter. MRA of the head was unremarkable. Carotid Dopplers with no ICA stenosis. Echocardiogram with ejection fraction of 65% and grade 1 diastolic dysfunction. Neurology consulted placed on Plavix therapy. Patient was not a TPA candidate. Subcutaneous Lovenox added for DVT prophylaxis. Hard stools but does feel constipated Review of Systems  Musculoskeletal: Positive for joint pain.       R shoulder  Neurological: Positive for speech change.   Objective: Vital Signs: Blood pressure 126/74, pulse 72, temperature 97.9 F (36.6 C), temperature source Oral, resp. rate 17, height 5\' 9"  (1.753 m), weight 73 kg (160 lb 15 oz), SpO2 100.00%. No results found. Results for orders placed during the hospital encounter of 03/13/12 (from the past 72 hour(s))  GLUCOSE, CAPILLARY     Status: Abnormal   Collection Time   03/23/12  7:46 AM      Component Value Range Comment   Glucose-Capillary 147 (*) 70 - 99 mg/dL   GLUCOSE, CAPILLARY     Status: Abnormal   Collection Time   03/23/12 11:30 AM      Component Value Range Comment   Glucose-Capillary 219 (*) 70 - 99 mg/dL   GLUCOSE, CAPILLARY     Status: Abnormal   Collection Time   03/23/12  4:57 PM      Component Value Range Comment   Glucose-Capillary 174 (*) 70 - 99 mg/dL   GLUCOSE, CAPILLARY     Status: Abnormal   Collection Time   03/23/12  9:01 PM      Component Value Range Comment   Glucose-Capillary 199 (*) 70 - 99 mg/dL   GLUCOSE, CAPILLARY     Status: Abnormal   Collection Time   03/24/12  7:26 AM      Component Value Range Comment   Glucose-Capillary 140 (*) 70 - 99 mg/dL   GLUCOSE, CAPILLARY     Status: Abnormal   Collection Time   03/24/12 11:50 AM      Component Value Range Comment   Glucose-Capillary 128 (*) 70 - 99 mg/dL   GLUCOSE, CAPILLARY     Status: Abnormal   Collection Time   03/24/12  4:45 PM      Component Value Range Comment   Glucose-Capillary 173 (*) 70 - 99 mg/dL   GLUCOSE, CAPILLARY     Status: Abnormal   Collection Time   03/24/12  9:12 PM      Component Value Range Comment   Glucose-Capillary 167 (*) 70 - 99 mg/dL   GLUCOSE, CAPILLARY     Status: Abnormal   Collection Time   03/25/12  8:06 AM      Component Value Range Comment   Glucose-Capillary 108 (*) 70 - 99 mg/dL    Comment 1 Notify RN     GLUCOSE, CAPILLARY     Status: Abnormal   Collection Time   03/25/12 11:22 AM      Component Value Range Comment   Glucose-Capillary 125 (*) 70 - 99 mg/dL    Comment 1 Notify RN     GLUCOSE, CAPILLARY     Status: Abnormal   Collection Time   03/25/12  4:16 PM  Component Value Range Comment   Glucose-Capillary 152 (*) 70 - 99 mg/dL   GLUCOSE, CAPILLARY     Status: Normal   Collection Time   03/25/12  8:45 PM      Component Value Range Comment   Glucose-Capillary 91  70 - 99 mg/dL      HEENT: normal Cardio: RRR Resp: CTA B/L GI: BS positive Extremity:  No Edema Skin:   Intact Neuro: Alert/Oriented, Cranial Nerve Abnormalities R central VII, Normal Sensory, Abnormal Motor 2-/5 R shoulder protraction and Dysarthric,  RLE 2-/5 hipextension, knee ext synergy, 0/5 R ankle Musc/Skel:  No pain with shoulder ROM  Assessment/Plan: 1. Functional deficits secondary to L putamen infarct with R HP, dysarthria and dysphagia which require 3+ hours per day of interdisciplinary therapy in a comprehensive inpatient rehab setting. Physiatrist is providing close team supervision and 24 hour management of active medical problems listed below. Physiatrist and rehab team continue to assess barriers to  discharge/monitor patient progress toward functional and medical goals. FIM: FIM - Bathing Bathing Steps Patient Completed: Chest;Right Arm;Left Arm;Abdomen;Front perineal area;Buttocks;Right upper leg;Left upper leg;Right lower leg (including foot);Left lower leg (including foot) Bathing: 4: Min-Patient completes 8-9 59f 10 parts or 75+ percent  FIM - Upper Body Dressing/Undressing Upper body dressing/undressing steps patient completed: Thread/unthread right sleeve of pullover shirt/dresss;Thread/unthread left sleeve of pullover shirt/dress;Put head through opening of pull over shirt/dress;Pull shirt over trunk Upper body dressing/undressing: 5: Set-up assist to: Obtain clothing/put away FIM - Lower Body Dressing/Undressing Lower body dressing/undressing steps patient completed: Thread/unthread right underwear leg;Thread/unthread left underwear leg;Pull underwear up/down;Thread/unthread right pants leg;Thread/unthread left pants leg;Pull pants up/down;Don/Doff right shoe;Don/Doff left shoe;Fasten/unfasten right shoe;Fasten/unfasten left shoe Lower body dressing/undressing: 4: Min-Patient completed 75 plus % of tasks  FIM - Toileting Toileting steps completed by patient: Adjust clothing prior to toileting;Performs perineal hygiene Toileting Assistive Devices: Grab bar or rail for support Toileting: 4: Steadying assist  FIM - Diplomatic Services operational officer Devices: Elevated toilet seat;Grab bars Toilet Transfers: 4-To toilet/BSC: Min A (steadying Pt. > 75%)  FIM - Bed/Chair Transfer Bed/Chair Transfer Assistive Devices: Arm rests Bed/Chair Transfer: 3: Bed > Chair or W/C: Mod A (lift or lower assist);3: Chair or W/C > Bed: Mod A (lift or lower assist)  FIM - Locomotion: Wheelchair Distance: 150 Locomotion: Wheelchair: 1: Total Assistance/staff pushes wheelchair (Pt<25%) FIM - Locomotion: Ambulation Ambulation/Gait Assistance: 3: Mod assist Locomotion: Ambulation: 0: Activity  did not occur  Comprehension Comprehension Mode: Auditory Comprehension: 7-Follows complex conversation/direction: With no assist  Expression Expression Mode: Verbal Expression: 6-Expresses complex ideas: With extra time/assistive device  Social Interaction Social Interaction: 6-Interacts appropriately with others with medication or extra time (anti-anxiety, antidepressant).  Problem Solving Problem Solving: 6-Solves complex problems: With extra time  Memory Memory: 6-Assistive device: No helper  Medical Problem List and Plan:  1. 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 DVT Prophylaxis/Anticoagulation: Subcutaneous Lovenox. Monitor platelet counts and any signs of bleeding  3. Dysphagia. Mechanical soft with nectar liquids. Monitor for any signs of aspiration. Speech therapy to followup  4. Neuropsych: This patient is capable of making decisions on his/her own behalf.  5. Diabetes mellitus.Uncontrolled Hemoglobin A1c 6.3.Increase Lantus insulin 45 units each bedtime. Patient on 45 units Lantus prior to admission as well as janumet 50-100 twice a day. Check blood sugars a.c. and at bedtime. Pt would rather try a higher dose of Lantus than restart oral agents.  Increase to 50Units.  Add CHO mod  diet 6. Hypertension. Lisinopril 10 mg daily. Monitor with increased mobility  7. Hyperlipidemia. Lipitor  8. Restless leg syndrome. Continue Mirapex 1 mg daily  9. Obstructive sleep apnea. CPAP. Patient has been using a CPAP machine for approximately 2 years. He is using his face mask from home. 10. urinary retention  Improved off flomax, nausea resolved 11. Insomnia. Zolpidem prn is helping 12.  R shoulder pain mainly at night will give hydrocodone qhs LOS (Days) 13 A FACE TO FACE EVALUATION WAS PERFORMED  Nyemah Watton E 03/26/2012, 7:41 AM

## 2012-03-26 NOTE — Progress Notes (Signed)
Recreational Therapy Session Note  Patient Details  Name: James Bell MRN: [id number] Date of Birth: 1931-03-02 Today's Date: 03/26/2012 Time:  06-1029 Pain: no c/o Skilled Therapeutic Interventions/Progress Updates:  Out and about on hospital grounds w/c level working on obstacle negotiation w/c mobility on indoor tile surface in crowded environment and on outdoor uneven surfaces while browsing the [employer] with supervision, min verbal/visual cues.  Stair climbing using 1 rail Tot A +2  Therapy/Group: Co-Treatment  Jarrick Fjeld 03/26/2012, 11:27 AM

## 2012-03-26 NOTE — Progress Notes (Signed)
Occupational Therapy Session Note  Patient Details  Name: James Bell MRN: [id number] Date of Birth: 01-11-1931  Today's Date: 03/26/2012 Time: 0730-0830 Time Calculation (min): 60 min  Short Term Goals: Week 1:  OT Short Term Goal 1 (Week 1): Patient will stand at sink using right upper extremity as support, with no more than min assist during bathing and dressing tasks. OT Short Term Goal 1 - Progress (Week 1): Progressing toward goal OT Short Term Goal 2 (Week 1): Patient will transfer to toilet with min assist  OT Short Term Goal 2 - Progress (Week 1): Met OT Short Term Goal 3 (Week 1): Patient will transfer to toilet with min assist  OT Short Term Goal 3 - Progress (Week 1): Met OT Short Term Goal 4 (Week 1): Patient will transfer to toilet with min assist  OT Short Term Goal 4 - Progress (Week 1): Met OT Short Term Goal 5 (Week 1): Patient will complete toileting with max assist  OT Short Term Goal 5 - Progress (Week 1): Met Week 2:  OT Short Term Goal 1 (Week 2): STG: Continue to progress towards LTGs.  Skilled Therapeutic Interventions/Progress Updates:  ADL re-training performed this AM. Session with focus on functional transfers, bathing, dressing, and safety awareness during morning routine. Patient required min vc's for set-up during transfers. Patient utilized long handled sponge during bathing without any LOB. No vc's required for hemi-dressing technique.  -RUE P/ROM performed; 10 reps; 1 set; all ranges as tolerated.  -5 bil shoulder shrugs performed with vc's; 5 scapular protraction/retraction; 1 set.  - Patient performed self ROM with vc's from therapist for technique and form; shoulder flexion/extention; scapular protraction; 5 reps; 1 set.  Therapy Documentation Precautions:  Precautions Precautions: Fall Restrictions Weight Bearing Restrictions: No Pain: Pain Assessment Pain Assessment: No/denies pain  See FIM for current functional  status  Therapy/Group: Individual Therapy  James Bell, Charisse March 03/26/2012, 9:17 AM

## 2012-03-26 NOTE — Progress Notes (Signed)
Physical Therapy Session Note  Patient Details  Name: James Bell MRN: [id number] Date of Birth: [birth year]  Today's Date: 03/26/2012 Time: 1000-1058 Time Calculation (min): 58 min  Short Term Goals: Week 2:  PT Short Term Goal 1 (Week 2): Patient will perform bed mobility and bed<>w/c transfers to L and R consistently with min A PT Short Term Goal 2 (Week 2): Patient will perform w/c mobility on unit with L hemi technique x 150 on unit min A PT Short Term Goal 3 (Week 2): Patient will perform standing pre gait and gait training with appropriate AD and orthosis x 25' max A  Therapy Documentation Precautions:  Precautions Precautions: Fall Restrictions Weight Bearing Restrictions: No Pain: Pain Assessment Pain Assessment: No/denies pain Locomotion : Stairs / Additional Locomotion Stairs: Yes Stairs Assistance: 1: +2 Total assist Stairs Assistance Details: Tactile cues for initiation;Tactile cues for sequencing;Tactile cues for weight shifting;Tactile cues for posture;Visual cues/gestures for sequencing;Verbal cues for sequencing;Verbal cues for precautions/safety;Verbal cues for technique Stairs Assistance Details (indicate cue type and reason): Stair negotiation training and single limb stance training on RLE for activation of extensors in stance up and down 5 cement stairs with one rail on R with bilat UE support, step to pattern sideways leading with LLE to ascend and RLE to descend with +2 A for stability and to fully advance RLE, for placement and to facilitate activation of extensors and for upright trunk during stance Stair Management Technique: One rail Right;Step to pattern;Sideways Number of Stairs: 5  Height of Stairs: 6  Wheelchair Mobility Wheelchair Mobility: Yes Wheelchair Assistance: 5: Supervision Wheelchair Assistance Details: Visual cues/gestures for precautions/safety;Verbal cues for sequencing;Verbal cues for technique;Verbal cues for  precautions/safety Wheelchair Propulsion: Left upper extremity;Left lower extremity Wheelchair Parts Management: Needs assistance Distance: 200, indoors in crowded environment over level surface and outdoors over uneven brick/cement and up and down slight incline with verbal cues for sequence to slow w/c down with grip; still requires verbal cues for sequencing and obstacle negotiation and to maintain straight path  Other Treatments: Treatments Neuromuscular Facilitation: Right;Lower Extremity;Activity to increase motor control;Activity to increase timing and sequencing;Activity to increase sustained activation Modalities Modalities: Copywriter, advertising Location: R anterior tib Electrical Stimulation Action: Ankle DF Electrical Stimulation Parameters: stored in Best boy Goals: Neuromuscular facilitation  See FIM for current functional status  Therapy/Group: Individual Therapy  Edman Circle [facility] 03/26/2012, 12:18 PM

## 2012-03-26 NOTE — Progress Notes (Addendum)
Speech Language Pathology Daily Session Note  Patient Details  Name: James Bell MRN: [id number] Date of Birth: 11-05-1930  Today's Date: 03/26/2012 Time: 0865-7846 Time Calculation (min): 28 min  Short Term Goals: Week 2: SLP Short Term Goal 1 (Week 2): Pt. will consume regular diet with thin liquids with minimal s/s aspiration demonstrating swallow precautions with supervision level assist  SLP Short Term Goal 2 (Week 2): Pt. will utilize diaphragmatic breathing in conversation for increased vocal control and increased words per breath with min verbal assist. SLP Short Term Goal 3 (Week 2): Pt. will attend to upper right extremity during activities and in his environment with supervision verbal/visual cues. SLP Short Term Goal 4 (Week 2): Pt. will demonstrate appropriate executive functioning abilities via diagnostic assessment given supervision cues.  Skilled Therapeutic Interventions: Treatment session focused on utilizing diaphragmatic breathing during speech intelligibility at the conversational level with min verbal cues. During structured oral reading exercise paitent demonstrated adequate breath support and vocal intensity at the begniing of sentnaces and then trailed off at the end of sentences; with no instances of self monitoring or correcting. Sips throughout session resulted in no overt s/s of aspiration.   FIM:  Comprehension Comprehension Mode: Auditory Comprehension: 7-Follows complex conversation/direction: With no assist Expression Expression Mode: Verbal Expression: 6-Expresses complex ideas: With extra time/assistive device Social Interaction Social Interaction: 6-Interacts appropriately with others with medication or extra time (anti-anxiety, antidepressant). Problem Solving Problem Solving: 6-Solves complex problems: With extra time Memory Memory: 6-Assistive device: No helper FIM - Eating Eating Activity: 5: Supervision/cues  Pain Pain Assessment Pain  Assessment: No/denies pain Pain Score: 0-No pain  Therapy/Group: Individual Therapy  Charlane Ferretti., CCC-SLP [phone]  James Bell 03/26/2012, 2:49 PM

## 2012-03-27 LAB — GLUCOSE, CAPILLARY
Glucose-Capillary: 76 mg/dL (ref 70–99)
Glucose-Capillary: 174 mg/dL — ABNORMAL HIGH (ref 70–99)
Glucose-Capillary: 110 mg/dL — ABNORMAL HIGH (ref 70–99)
Glucose-Capillary: 125 mg/dL — ABNORMAL HIGH (ref 70–99)

## 2012-03-27 NOTE — Progress Notes (Signed)
Nutrition Follow-up  Intervention:   1.  Meals/snacks; RD will have snacks sent for pt to store in refrigerator which can be eaten for breakfast.  This will help pt consume an earlier breakfast for better spacing of breakfast and lunch.  Assessment:   Pt intake has decreased to 50% of meals, Ensure Pudding 1-2 times daily.  Pt relates this change to meal spacing not consistent with his usual.  Discussed how this can be achieved with morning snacks. Coincidentally, pt's intake decreased with change of diet order to CHO Modified Medium.  Pt denies diet changes affected his intake as he typically eats low CHO.  Pt states his usual wt is 180 lbs.  His admission wt was 173 lbs (6/28). Current wt is 163 lbs (7/10) which represents a 5.7% wt loss since admission in 2 weeks.  Pt states he wanted to lose wt.  RD encouraged that wt loss goals are appropriate for pt, however slower wt loss to enhance rehab performance preferred at this time. Pt agrees although it is noted that pt discussed wt loss with unit RD at initial visit (7/3), and wt loss has continued.  Encouraged PO intake.  Pt would benefit from reinforcement from medical team.  Diet Order:  CHO Modified Medium  Meds: Scheduled Meds:   . atorvastatin  40 mg Oral q1800  . clopidogrel  75 mg Oral Q breakfast  . docusate sodium  100 mg Oral BID  . DULoxetine  60 mg Oral Daily  . enoxaparin  40 mg Subcutaneous Q24H  . feeding supplement  1 Container Oral TID BM  . HYDROcodone-acetaminophen  1 tablet Oral Once  . HYDROcodone-acetaminophen  1 tablet Oral QHS,MR X 1  . insulin aspart  0-15 Units Subcutaneous TID WC  . insulin glargine  50 Units Subcutaneous QHS  . lisinopril  10 mg Oral Daily  . pantoprazole  40 mg Oral Q1200  . pramipexole  1 mg Oral Daily   Continuous Infusions:  PRN Meds:.acetaminophen, alum & mag hydroxide-simeth, methocarbamol, Muscle Rub, ondansetron (ZOFRAN) IV, ondansetron, polyethylene glycol, sorbitol, traMADol,  zolpidem  Labs:  CMP     Component Value Date/Time   NA 136 03/14/2012 0610   K 4.2 03/14/2012 0610   CL 101 03/14/2012 0610   CO2 21 03/14/2012 0610   GLUCOSE 167* 03/14/2012 0610   BUN 33* 03/14/2012 0610   CREATININE 1.21 03/14/2012 0610   CALCIUM 9.6 03/14/2012 0610   PROT 7.7 03/14/2012 0610   ALBUMIN 3.7 03/14/2012 0610   AST 18 03/14/2012 0610   ALT 15 03/14/2012 0610   ALKPHOS 40 03/14/2012 0610   BILITOT 0.5 03/14/2012 0610   GFRNONAA 56* 03/14/2012 0610   GFRAA 64* 03/14/2012 0610     Intake/Output Summary (Last 24 hours) at 03/27/12 1429 Last data filed at 03/27/12 1327  Gross per 24 hour  Intake    480 ml  Output      1 ml  Net    479 ml   1 BM daily.   Re-estimated needs:  1600-1900 kcal, 70-80g protein  Nutrition Dx:  Swallowing difficulty, resolved.  Pt with good tolerance of Regular meals, thin liquids. New nutrition dx:  Inadequate oral intake r/t decreased appetite AEB pt report, inadequate meal spacing  Monitor:   1.  Food/Beverage; improvement in intake to >75% of meals with continued Ensure Pudding at least once daily. 2.  Wt/wt change; deter further loss   Loyce Dys, MS RD LDN Clinical Inpatient Dietitian Pager: [phone]

## 2012-03-27 NOTE — Progress Notes (Signed)
Patient ID: James Bell, male   DOB: [birth year], 78 y.o.   MRN: [id number] Subjective/Complaints:  HPI: James Bell is a 78 y.o. right-handed male with history of diabetes mellitus and hyperlipidemia. Patient works as an Clinical research associate. Admitted 6/24 78 with progressive right-sided weakness. MRI revealed moderate size acute infarct affecting the left posterior putamen and left periventricular white matter. MRA of the head was unremarkable. Carotid Dopplers with no ICA stenosis. Echocardiogram with ejection fraction of 65% and grade 1 diastolic dysfunction. Neurology consulted placed on Plavix therapy. Patient was not a TPA candidate. Subcutaneous Lovenox added for DVT prophylaxis. Hard stools but does feel constipated   Review of Systems  Musculoskeletal: Positive for joint pain.       R shoulder  Neurological: Positive for speech change.   Objective: Vital Signs: Blood pressure 131/77, pulse 77, temperature 97.9 F (36.6 C), temperature source Oral, resp. rate 17, height 5\' 9"  (1.753 m), weight 74.2 kg (163 lb 9.3 oz), SpO2 98.00%. No results found. Results for orders placed during the hospital encounter of 03/13/12 (from the past 72 hour(s))  GLUCOSE, CAPILLARY     Status: Abnormal   Collection Time   03/24/12 11:50 AM      Component Value Range Comment   Glucose-Capillary 128 (*) 70 - 99 mg/dL   GLUCOSE, CAPILLARY     Status: Abnormal   Collection Time   03/24/12  4:45 PM      Component Value Range Comment   Glucose-Capillary 173 (*) 70 - 99 mg/dL   GLUCOSE, CAPILLARY     Status: Abnormal   Collection Time   03/24/12  9:12 PM      Component Value Range Comment   Glucose-Capillary 167 (*) 70 - 99 mg/dL   GLUCOSE, CAPILLARY     Status: Abnormal   Collection Time   03/25/12  8:06 AM      Component Value Range Comment   Glucose-Capillary 108 (*) 70 - 99 mg/dL    Comment 1 Notify RN     GLUCOSE, CAPILLARY     Status: Abnormal   Collection Time   03/25/12 11:22 AM   Component Value Range Comment   Glucose-Capillary 125 (*) 70 - 99 mg/dL    Comment 1 Notify RN     GLUCOSE, CAPILLARY     Status: Abnormal   Collection Time   03/25/12  4:16 PM      Component Value Range Comment   Glucose-Capillary 152 (*) 70 - 99 mg/dL   GLUCOSE, CAPILLARY     Status: Normal   Collection Time   03/25/12  8:45 PM      Component Value Range Comment   Glucose-Capillary 91  70 - 99 mg/dL   GLUCOSE, CAPILLARY     Status: Normal   Collection Time   03/26/12  7:29 AM      Component Value Range Comment   Glucose-Capillary 87  70 - 99 mg/dL    Comment 1 Notify RN     GLUCOSE, CAPILLARY     Status: Abnormal   Collection Time   03/26/12 11:11 AM      Component Value Range Comment   Glucose-Capillary 120 (*) 70 - 99 mg/dL    Comment 1 STAT Lab      Comment 2 Notify RN     GLUCOSE, CAPILLARY     Status: Abnormal   Collection Time   03/26/12  4:41 PM      Component Value Range Comment   Glucose-Capillary  111 (*) 70 - 99 mg/dL   GLUCOSE, CAPILLARY     Status: Abnormal   Collection Time   03/26/12  9:17 PM      Component Value Range Comment   Glucose-Capillary 133 (*) 70 - 99 mg/dL   GLUCOSE, CAPILLARY     Status: Normal   Collection Time   03/27/12  7:27 AM      Component Value Range Comment   Glucose-Capillary 76  70 - 99 mg/dL    Comment 1 Notify RN        HEENT: normal Cardio: RRR Resp: CTA B/L GI: BS positive Extremity:  No Edema Skin:   Intact Neuro: Alert/Oriented, Cranial Nerve Abnormalities R central VII, Normal Sensory, Abnormal Motor 2-/5 R shoulder protraction and Dysarthric,  RLE 2-/5 hipextension, knee ext synergy, 0/5 R ankle Musc/Skel:  No pain with shoulder ROM  Assessment/Plan: 1. Functional deficits secondary to L putamen infarct with R HP, dysarthria and dysphagia plan is for SNF, medically ready when bed available.  PMR f/u in 1 month         FIM: FIM - Bathing Bathing Steps Patient Completed: Chest;Right Arm;Left Arm;Abdomen;Front perineal  area;Buttocks;Right upper leg;Left upper leg;Right lower leg (including foot);Left lower leg (including foot) Bathing: 4: Steadying assist  FIM - Upper Body Dressing/Undressing Upper body dressing/undressing steps patient completed: Thread/unthread right sleeve of pullover shirt/dresss;Thread/unthread left sleeve of pullover shirt/dress;Put head through opening of pull over shirt/dress;Pull shirt over trunk Upper body dressing/undressing: 5: Set-up assist to: Obtain clothing/put away FIM - Lower Body Dressing/Undressing Lower body dressing/undressing steps patient completed: Thread/unthread right underwear leg;Thread/unthread left underwear leg;Pull underwear up/down;Thread/unthread right pants leg;Thread/unthread left pants leg;Pull pants up/down;Don/Doff right shoe;Don/Doff left shoe;Fasten/unfasten right shoe;Fasten/unfasten left shoe Lower body dressing/undressing: 4: Min-Patient completed 75 plus % of tasks  FIM - Toileting Toileting steps completed by patient: Adjust clothing prior to toileting;Performs perineal hygiene;Adjust clothing after toileting Toileting Assistive Devices: Grab bar or rail for support Toileting: 4: Steadying assist  FIM - Diplomatic Services operational officer Devices: Elevated toilet seat;Grab bars Toilet Transfers: 4-To toilet/BSC: Min A (steadying Pt. > 75%);4-From toilet/BSC: Min A (steadying Pt. > 75%)  FIM - Bed/Chair Transfer Bed/Chair Transfer Assistive Devices: Arm rests Bed/Chair Transfer: 5: Supine > Sit: Supervision (verbal cues/safety issues);3: Bed > Chair or W/C: Mod A (lift or lower assist);4: Chair or W/C > Bed: Min A (steadying Pt. > 75%)  FIM - Locomotion: Wheelchair Distance: 200, indoors in crowded environment over level surface and outdoors over uneven brick/cement and up and down slight incline with verbal cues for sequence to slow w/c down with grip; still requires verbal cues for sequencing and obstacle negotiation and to maintain  straight path Locomotion: Wheelchair: 5: Travels 150 ft or more: maneuvers on rugs and over door sills with supervision, cueing or coaxing FIM - Locomotion: Ambulation Ambulation/Gait Assistance: 3: Mod assist Locomotion: Ambulation: 0: Activity did not occur  Comprehension Comprehension Mode: Auditory Comprehension: 7-Follows complex conversation/direction: With no assist  Expression Expression Mode: Verbal Expression: 6-Expresses complex ideas: With extra time/assistive device  Social Interaction Social Interaction: 6-Interacts appropriately with others with medication or extra time (anti-anxiety, antidepressant).  Problem Solving Problem Solving: 6-Solves complex problems: With extra time  Memory Memory: 6-Assistive device: No helper  Medical Problem List and Plan:  1. 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 DVT Prophylaxis/Anticoagulation: Subcutaneous Lovenox. Monitor platelet counts and any signs of bleeding  3. Dysphagia. Mechanical soft with nectar liquids. Monitor  for any signs of aspiration. Speech therapy to followup  4. Neuropsych: This patient is capable of making decisions on his/her own behalf.  5. Diabetes mellitus.controlled Hemoglobin A1c 6.3.Increase Lantus insulin 50 units each bedtime. Patient on 45 units Lantus prior to admission as well as janumet 50-100 twice a day. Check blood sugars a.c. and at bedtime. Pt would rather try a higher dose of Lantus than restart oral agents.  .  Add CHO mod diet 6. Hypertension. Lisinopril 10 mg daily. Monitor with increased mobility  7. Hyperlipidemia. Lipitor  8. Restless leg syndrome. Continue Mirapex 1 mg daily  9. Obstructive sleep apnea. CPAP. Patient has been using a CPAP machine for approximately 2 years. He is using his face mask from home. 10. urinary retention  Improved off flomax, nausea resolved 11. Insomnia. Zolpidem prn is helping 12.  R shoulder pain mainly at night  will give hydrocodone qhs LOS (Days) 14 A FACE TO FACE EVALUATION WAS PERFORMED  Horatio Bertz E 03/27/2012, 8:55 AM

## 2012-03-27 NOTE — Progress Notes (Signed)
Physical Therapy Weekly Progress Note  Patient Details  Name: James Bell MRN: [id number] Date of Birth: 04/04/1931  Today's Date: 03/27/2012 Time: 6962-9528 Time Calculation (min): 45 min  Patient continues to make steady progress towards PT LTG and has met 1 of 3 short term goals.  Patient is currently supervision-min A for w/c mobility in controlled environment, mod A overall for bed mobility, bed <> chair transfers and max-total A for standing and gait training with use of Bioness electrical stimulation for ankle DF and knee extension in stance.  Patient's family feels that they will not be able to provide the patient with adequate physical assistance and supervision upon D/C and have decided to seek SNF placement upon D/C from CIR for more prolonged rehabilitation for patient to reach higher level of function prior to D/C home.    Patient continues to demonstrate the following deficits: R hemiplegia with impaired motor control, timing, sequencing, decreased activity tolerance/endurance, apraxia, impaired memory, problem solving and sequencing, impaired postural control, dynamic sitting balance, standing balance and gait and therefore will continue to benefit from skilled PT intervention to enhance overall performance with activity tolerance, balance, postural control, ability to compensate for deficits, functional use of  right upper extremity and right lower extremity, attention, coordination and functional transfers and mobility.  Patient secondary to family and patient seeking SNF placement upon D/C patient's goals downgraded.  Plan of care revisions: SNF placement now; goals downgraded to supervision w/c level and min-mod A overall for transfers and gait with therapy only.  PT Short Term Goals Week 2:  PT Short Term Goal 1 (Week 2): Patient will perform bed mobility and bed<>w/c transfers to L and R consistently with min A PT Short Term Goal 1 - Progress (Week 2): Progressing toward  goal PT Short Term Goal 2 (Week 2): Patient will perform w/c mobility on unit with L hemi technique x 150 on unit min A PT Short Term Goal 2 - Progress (Week 2): Met PT Short Term Goal 3 (Week 2): Patient will perform standing pre gait and gait training with appropriate AD and orthosis x 25' max A PT Short Term Goal 3 - Progress (Week 2): Met Week 3:  PT Short Term Goal 1 (Week 3): = LTG of supervision w/c mobility, min-mod A overall transfers; SNF placement  Therapy Documentation Precautions:  Precautions Precautions: Fall Restrictions Weight Bearing Restrictions: No Pain: Pain Assessment Pain Assessment: 0-10 Pain Score:   1 Pain Type: Acute pain Pain Location: Shoulder Pain Orientation: Right Pain Descriptors: Aching Pain Frequency: Occasional Pain Onset: Other (Comment) (may have slept on it) Pain Intervention(s): Repositioned Locomotion : Ambulation Ambulation/Gait Assistance: 1: +1 Total assist with // bars x 4 reps with assistance to maintain upright trunk, full RLE advancement and placement to minimize ADD and cues for forward weight shift and activation of extensors with Bioness in stance; patient tends to pitch himself forward through trunk flexion Other Treatments: Treatments Neuromuscular Facilitation: Right;Lower Extremity;Activity to increase motor control;Activity to increase timing and sequencing;Activity to increase sustained activation;Activity to increase lateral weight shifting;Activity to increase anterior-posterior weight shifting Modalities Modalities: Archivist Stimulation Location: R anterior tib and R quad muscles Electrical Stimulation Action: ankle DF and knee extension Electrical Stimulation Parameters: stored in Bioness unit Electrical Stimulation Goals: Neuromuscular facilitation during gait and sit <> stand from w/c for concentric and eccentric training  See FIM for current functional  status  Therapy/Group: Individual Therapy  Edman Circle [place]ette 03/27/2012, 10:20 AM

## 2012-03-27 NOTE — Progress Notes (Signed)
Speech Language Pathology Daily Session Note  Patient Details  Name: James Bell MRN: [id number] Date of Birth: 08/18/1931  Today's Date: 03/27/2012 Time: 6578-4696 Time Calculation (min): 23 min  Short Term Goals: Week 2: SLP Short Term Goal 1 (Week 2): Pt. will consume regular diet with thin liquids with minimal s/s aspiration demonstrating swallow precautions with supervision level assist  SLP Short Term Goal 2 (Week 2): Pt. will utilize diaphragmatic breathing in conversation for increased vocal control and increased words per breath with min verbal assist. SLP Short Term Goal 3 (Week 2): Pt. will attend to upper right extremity during activities and in his environment with supervision verbal/visual cues. SLP Short Term Goal 4 (Week 2): Pt. will demonstrate appropriate executive functioning abilities via diagnostic assessment given supervision cues.  Skilled Therapeutic Interventions: Treatment session focused on addressing dysphagia goals.  SLP administered trails of thin liquids with supervision verbal cues to maintain head neutral position with sips which resulted in no overt s/s of aspriation in 5/5 of cup sips and cues for consecutive sips resulted in cough 2/2 trials.  SLP also facilcaited discussion of discharge status with open ended questions, which patient was able to answer with min assist semantic cues to recall specific details such as date of upcoming transfer to SNF.  Patient reported being tired and uncomfortable in wheelchair so session was ended early per his request.    FIM:  Comprehension Comprehension Mode: Auditory Comprehension: 6-Follows complex conversation/direction: With extra time/assistive device Expression Expression Mode: Verbal Expression: 6-Expresses complex ideas: With extra time/assistive device Social Interaction Social Interaction: 6-Interacts appropriately with others with medication or extra time (anti-anxiety, antidepressant). Problem  Solving Problem Solving: 6-Solves complex problems: With extra time Memory Memory: 5-Recognizes or recalls 90% of the time/requires cueing < 10% of the time FIM - Eating Eating Activity: 5: Supervision/cues  Pain Pain Assessment Pain Assessment: No/denies pain Pain Score: 0-No pain  Therapy/Group: Individual Therapy  Charlane Ferretti., CCC-SLP [phone]  Vickee Mormino 03/27/2012, 4:08 PM

## 2012-03-27 NOTE — Progress Notes (Signed)
Speech Language Pathology Daily Session Note [place]  Patient Details  Name: James Bell MRN: [id number] Date of Birth: 07-11-1931  Today's Date: 03/27/2012 Time: 1125-1220 Time Calculation (min): 55 min  Short Term Goals: Week 2: SLP Short Term Goal 1 (Week 2): Pt. will consume regular diet with thin liquids with minimal s/s aspiration demonstrating swallow precautions with supervision level assist  SLP Short Term Goal 2 (Week 2): Pt. will utilize diaphragmatic breathing in conversation for increased vocal control and increased words per breath with min verbal assist. SLP Short Term Goal 3 (Week 2): Pt. will attend to upper right extremity during activities and in his environment with supervision verbal/visual cues. SLP Short Term Goal 4 (Week 2): Pt. will demonstrate appropriate executive functioning abilities via diagnostic assessment given supervision cues.  Skilled Therapeutic Interventions: Pt seen in [employer] with treatment focus on utilization of swallowing compensatory strategies with regular textures and thin liquids. Pt required supervision verbal and questioning cues to utilize head tilt and chin down with thin liquids and to monitor right anterior spillage. Pt without overt s/s of aspiration.    FIM:  Comprehension Comprehension Mode: Auditory Comprehension: 6-Follows complex conversation/direction: With extra time/assistive device Expression Expression Mode: Verbal Expression: 6-Expresses complex ideas: With extra time/assistive device Social Interaction Social Interaction: 6-Interacts appropriately with others with medication or extra time (anti-anxiety, antidepressant). Problem Solving Problem Solving: 6-Solves complex problems: With extra time Memory Memory: 5-Recognizes or recalls 90% of the time/requires cueing < 10% of the time FIM - Eating Eating Activity: 5: Supervision/cues  Pain Pain Assessment Pain Assessment: No/denies  pain  Therapy/Group: Dysphagia Group  Syesha Thaw 03/27/2012, 3:45 PM

## 2012-03-27 NOTE — Progress Notes (Signed)
Occupational Therapy Session Note  Patient Details  Name: James Bell MRN: [id number] Date of Birth: [birth year]  Today's Date: 03/27/2012 Time: 0730-0830 Time Calculation (min): 60 min  Short Term Goals: Week 1:  OT Short Term Goal 1 (Week 1): Patient will stand at sink using right upper extremity as support, with no more than min assist during bathing and dressing tasks. OT Short Term Goal 1 - Progress (Week 1): Progressing toward goal OT Short Term Goal 2 (Week 1): Patient will transfer to toilet with min assist  OT Short Term Goal 2 - Progress (Week 1): Met OT Short Term Goal 3 (Week 1): Patient will transfer to toilet with min assist  OT Short Term Goal 3 - Progress (Week 1): Met OT Short Term Goal 4 (Week 1): Patient will transfer to toilet with min assist  OT Short Term Goal 4 - Progress (Week 1): Met OT Short Term Goal 5 (Week 1): Patient will complete toileting with max assist  OT Short Term Goal 5 - Progress (Week 1): Met Week 2:  OT Short Term Goal 1 (Week 2): STG: Continue to progress towards LTGs.  Skilled Therapeutic Interventions/Progress Updates:  ADL re-training performed this AM. Session with focus on functional transfers, bathing, dressing, and safety awareness during morning routine. Patient required min vc's for set-up during transfers. Patient utilized long handled sponge during bathing without any LOB. No vc's required for hemi-dressing technique. Patient reports 1/10 pain in right shoulder. Patient may have slept on shoulder. -3 bil shoulder shrugs performed with vc's; 3 scapular protraction/retraction; 1 set.  - Patient performed self Active assisted ROM with RUE on towel on table top; shoulder flexion/extension; elbow flexion/extension; 1 set; 3 reps with rest break in between each rep. Max vc's for motivation during exercise. Min Assist for shoulder extension. Mod A for elbow flexion and shoulder flexion. Max Assist for elbow extension.  Therapy  Documentation Precautions:  Precautions Precautions: Fall Restrictions Weight Bearing Restrictions: No Pain: Pain Assessment Pain Assessment: 0-10 Pain Score:   1 Pain Type: Acute pain Pain Location: Shoulder Pain Orientation: Right Pain Descriptors: Aching Pain Frequency: Occasional Pain Onset: Other (Comment) (may have slept on it) Pain Intervention(s): Repositioned  See FIM for current functional status  Therapy/Group: Individual Therapy  Panagiota Perfetti, Charisse March, OTR/L 03/27/2012, 9:48 AM

## 2012-03-27 NOTE — Progress Notes (Signed)
Pt place don cpap at this time but lowered pressure from 10 to 8cmh2o at request of pt

## 2012-03-27 NOTE — Progress Notes (Signed)
Occupational Therapy Session Note  Patient Details  Name: James Bell MRN: [id number] Date of Birth: 09-Nov-1930  Today's Date: 03/27/2012 Time: 1300-1330 Time Calculation (min): 30 min   Skilled Therapeutic Interventions/Progress Updates:    Worked on [employer] for the RUE.  Began in closed chain weightbearing with emphasis on maintaining elbow extension with hand positioned beside of him on the mat, while reaching across with the LUE.  Pt needs mod facilitation to maintain some elbow extension with this task as well as cueing to not hold his breath.  Transitioned to active movement in the shoulder and elbow in order to facilitate movement on a tilted stool.  Worked on repetitions of pushing stool forward using controlled arm movement avoiding compensation at the shoulder and trunk.  Therapy Documentation Precautions:  Precautions Precautions: Fall Precaution Comments: right hemiparesis Restrictions Weight Bearing Restrictions: No  Pain: Pain Assessment Pain Assessment: No/denies pain ADL: ADL Grooming: Supervision/safety Where Assessed-Grooming: Sitting at sink Upper Body Bathing: Minimal assistance Where Assessed-Upper Body Bathing: Shower  See FIM for current functional status  Therapy/Group: Individual Therapy  [name] OTR/L 03/27/2012, 3:54 PM

## 2012-03-27 NOTE — Progress Notes (Signed)
Social Work Patient ID: James Bell, male   DOB: [birth year], 77 y.o.   MRN: [id number] Met with pt and spoke to daughter-Alpheus Stiff via telephone to discuss team conference and progression toward goals. Have sent out FL2 looking for a short term NH bed.  Heard from [facility] they will have a bed Friday afternoon. This is pt's and daughter's first choice.  They have accepted the bed and daughter will contact admission coordinator To discuss paperwork for Friday.  Both agreeable to plan and will plan to transfer Friday.

## 2012-03-28 DIAGNOSIS — I6330 Cerebral infarction due to thrombosis of unspecified cerebral artery: Secondary | ICD-10-CM

## 2012-03-28 DIAGNOSIS — G8110 Spastic hemiplegia affecting unspecified side: Secondary | ICD-10-CM

## 2012-03-28 DIAGNOSIS — Z5189 Encounter for other specified aftercare: Secondary | ICD-10-CM

## 2012-03-28 LAB — GLUCOSE, CAPILLARY
Glucose-Capillary: 104 mg/dL — ABNORMAL HIGH (ref 70–99)
Glucose-Capillary: 174 mg/dL — ABNORMAL HIGH (ref 70–99)
Glucose-Capillary: 107 mg/dL — ABNORMAL HIGH (ref 70–99)
Glucose-Capillary: 224 mg/dL — ABNORMAL HIGH (ref 70–99)

## 2012-03-28 MED ORDER — HYDROCODONE-ACETAMINOPHEN 5-325 MG PO TABS
0.5000 | ORAL_TABLET | Freq: Every evening | ORAL | Status: DC | PRN
  Administered 2012-03-28: 0.5 via ORAL
  Filled 2012-03-28: qty 1

## 2012-03-28 NOTE — Discharge Summary (Signed)
  Discharge summary job [phone]

## 2012-03-28 NOTE — Progress Notes (Signed)
Speech Language Pathology Daily Session Note  Patient Details  Name: James Bell MRN: [id number] Date of Birth: [birth year]  Today's Date: 03/28/2012 Time: 1130-1205 Time Calculation (min): 35 min  Short Term Goals: Week 2: SLP Short Term Goal 1 (Week 2): Pt. will consume regular diet with thin liquids with minimal s/s aspiration demonstrating swallow precautions with supervision level assist  SLP Short Term Goal 2 (Week 2): Pt. will utilize diaphragmatic breathing in conversation for increased vocal control and increased words per breath with min verbal assist. SLP Short Term Goal 3 (Week 2): Pt. will attend to upper right extremity during activities and in his environment with supervision verbal/visual cues. SLP Short Term Goal 4 (Week 2): Pt. will demonstrate appropriate executive functioning abilities via diagnostic assessment given supervision cues.  Skilled Therapeutic Interventions: Pt seen in [employer] with treatment focus on utilization of swallowing compensatory strategies with regular textures and thin liquids. Pt required supervision verbal cue X 1 to utilize head tilt and chin down with thin liquids. Pt without overt s/s of aspiration.    FIM:  Comprehension Comprehension Mode: Auditory Comprehension: 5-Follows basic conversation/direction: With extra time/assistive device Expression Expression Mode: Verbal Expression: 5-Expresses basic 90% of the time/requires cueing < 10% of the time. Social Interaction Social Interaction: 5-Interacts appropriately 90% of the time - Needs monitoring or encouragement for participation or interaction. Problem Solving Problem Solving: 5-Solves basic 90% of the time/requires cueing < 10% of the time Memory Memory: 5-Recognizes or recalls 90% of the time/requires cueing < 10% of the time FIM - Eating Eating Activity: 5: Supervision/cues  Pain Pain Assessment Pain Assessment: No/denies pain  Therapy/Group: Individual  Therapy  [name] 03/28/2012, 4:36 PM

## 2012-03-28 NOTE — Discharge Summary (Signed)
NAMELORRIE, STRAUCH            ACCOUNT NO.:  [id number]  MEDICAL RECORD NO.:  [id number]  LOCATION:  4036                         FACILITY:  [facility]  PHYSICIAN:  Erick Colace, M.D.DATE OF BIRTH:  02/09/1931  DATE OF ADMISSION:  03/13/2012 DATE OF DISCHARGE:  03/30/2012                              DISCHARGE SUMMARY   DISCHARGE DIAGNOSES: 1. Left posterior basal ganglia and periventricular thrombotic white     matter infarction. 2. Subcutaneous Lovenox for deep vein thrombosis prophylaxis,     dysphagia, diabetes mellitus, hypertension, hyperlipidemia,     restless legs syndrome,  and obstructive sleep apnea.  HISTORY OF PRESENT ILLNESS:  This is a 78 year old right-handed male, works as an Clinical research associate.  Admitted March 11, 2012, with progressive right-sided weakness.  MRI revealed moderate-size acute infarct affecting the left posterior putamen and left periventricular white matter.  MRA of the head was unremarkable.  Carotid Dopplers with no ICA stenosis.  Echocardiogram with ejection fraction of 65% and grade 1 diastolic dysfunction.  Neurology consulted, placed on Plavix therapy. The patient was not a tPA candidate.  Subcutaneous Lovenox added for DVT prophylaxis.  Placed on a dysphagia 3 nectar thick liquid diet per Speech Therapy.  The patient was admitted for comprehensive rehab program.  PAST MEDICAL HISTORY:  See discharge diagnoses.  SOCIAL HISTORY:  Lives alone.  He works as an Clinical research associate, 1 level home.  No steps to entry.  FUNCTIONAL HISTORY:  Prior to admission was independent.  Functional status upon admission to rehab services was +2 total assist to scoot to the edge of the bed, +2 total assist for sit to stand.  PHYSICAL EXAMINATION:  VITAL SIGNS:  Blood pressure 147/81, pulse 75, temperature 97.4, respirations 18.  GENERAL:  This was an alert male, oriented x3.  Well developed, well nourished, normocephalic.  LUNGS:  Clear to  auscultation  CARDIAC:  Regular rate and rhythm.  ABDOMEN:  Soft and nontender.  Good bowel sounds.  NEUROLOGICAL:  Judgment and thought content were normal.  REHABILITATION HOSPITAL COURSE:  The patient was admitted to inpatient rehab services with therapies initiated on a 3-hour daily basis consisting of physical therapy, occupational therapy, speech therapy, and rehabilitation nursing.  The following issues were addressed during the patient's rehabilitation stay.  Pertaining to Mr. Buttery left posterior basal ganglia and periventricular thrombotic white matter infarction remained stable, maintained on Plavix therapy.  The patient would follow up with Dr. Pearlean Brownie of Neurology Services.  The patient continued on subcutaneous Lovenox for DVT prophylaxis throughout his rehab course.  His diet was slowly advanced to a regular consistency diabetic diet which showed no signs of aspiration.  His blood sugars had some variables with hemoglobin A1c of 6.3.  He remained on Lantus insulin with latest dose of 50 units at bedtime.  Blood sugars were checked a.c. and nightly.  Blood pressures controlled on lisinopril with no orthostatic changes.  He remained on Lipitor for hyperlipidemia.  He did have a history of restless legs syndrome, maintained on Mirapex 1 mg daily.  He was using CPAP as needed for obstructive sleep apnea.  The patient received weekly collaborative interdisciplinary team conferences to discuss estimated length of stay, family teaching, and  any barriers to discharge.  He was continent of bowel and receive regular toileting schedules.  He was supervisioned for grooming minimal assist for bathing, dressing, functional transfers moderate assist squat pivot, moderate to max assist transfers, and wheelchair mobility, total assist for ambulation.  He was able to communicate his needs.  Due to limited support at home it was felt skilled nursing facility was needed with  bed becoming available on March 29, 2012, and discharge taking place at that time.  DISCHARGE MEDICATIONS:  At time of dictation included Lipitor 40 mg p.o. daily.  Plavix 75 mg p.o. daily.  Cymbalta 60 mg p.o. daily. 1. Lantus insulin 50 units subcutaneously at bedtime.  Lisinopril 10     mg p.o. daily.  Robaxin 500 mg p.o. every 6 hours as needed muscle     spasms.  Protonix 40 mg daily.  Mirapex 1 mg p.o. daily.  Ultram 50     mg p.o. every 6 hours as needed for pain.  DIET:  Diet was 1800 calorie ADA diet.  SPECIAL INSTRUCTIONS:  Continue therapies to promote overall mobility and well being.  The patient should continue with CPAP at night for history of obstructive sleep apnea.  Arrangements were made to see Dr. Claudette Laws in the outpatient rehab center on May 03, 2012, arrive at 12:30 p.m.  The patient to follow up with Dr. Talmage Coin, medical Management and Dr. Delia Heady in 1 month Neurology Services.     Mariam Dollar, P.A.   ______________________________ Erick Colace, M.D.    DA/MEDQ  D:  03/28/2012  T:  03/28/2012  Job:  [id number]  cc:   Tonita Cong, M.D. Pramod P. Pearlean Brownie, MD

## 2012-03-28 NOTE — Progress Notes (Addendum)
Occupational Therapy Discharge Summary and Treatment Session  Patient Details  Name: James Bell MRN: [id number] Date of Birth: 1931/09/01  Today's Date: 03/28/2012 Time: 0730-0830 Time Calculation (min): 60 min  Patient has met 8 of 10 long term goals due to improved activity tolerance, improved balance, postural control and ability to compensate for deficits.  Patient to discharge at overall Supervision to [facility] Assist level.  Patient's care partner unavailable to provide the necessary physical assistance at discharge.    Reasons goals not met: Patient requires Min Assist for UB bathing and Min Assist for balance during toilet transfers due to right side body weakness.  Recommendation:  Patient will benefit from ongoing skilled OT services in skilled nursing facility setting to continue to advance functional skills in the area of BADL.  Equipment: defer to next venue  Reasons for discharge: patient reached maximum potential for rehab.  Patient/family agrees with progress made and goals achieved: Yes  OT Discharge Pain Pain Assessment Pain Assessment: 0-10 Pain Score:   1 Pain Type: Acute pain Pain Location: Shoulder Pain Orientation: Right  See FIM for current functional status  1:1 ADL re-training completed this date. Session with focus on functional transfers, bathing, dressing, and safety awareness during morning routine. Patient required min vc's for set-up during transfers. Patient utilized long handled sponge during bathing without any LOB. No vc's required for hemi-dressing technique. -Patient utilized arm skateboard in Right hand on table top to complete shoulder flexion/extension and elbow flexion/extension; 1 set; 5 reps. Rest breaks after each set. Patient required max vc's for motivation with tapping to facilitate muscle contraction. Min Assist for elbow flexion; mod assist for elbow extension; max assist for shoulder flexion/extension. P/ROM RUE; all ranges as  tolerated; 1 set; 5 reps   Ekam Besson, Charisse March, OTR/L 03/28/2012, 8:05 AM

## 2012-03-28 NOTE — Patient Care Conference (Signed)
Inpatient RehabilitationTeam Conference Note Date: 03/27/2012   Time: 10:45 AM    Patient Name: James Bell      Medical Record Number: [id number]  Date of Birth: August 08, 1931 Sex: Male         Room/Bed: 4036/4036-01 Payor Info: Payor: MEDICARE  Plan: MEDICARE PART A AND B  Product Type: *No Product type*     Admitting Diagnosis: L CVA  Admit Date/Time:  03/13/2012  2:20 PM Admission Comments: No comment available   Primary Diagnosis:  CVA (cerebral vascular accident) Principal Problem: CVA (cerebral vascular accident)  Patient Active Problem List   Diagnosis Date Noted  . CVA (cerebral vascular accident) 03/13/2012  . CVA (cerebrovascular accident) 03/11/2012  . Right hemiparesis 03/11/2012  . DM type 2 (diabetes mellitus, type 2) 03/11/2012  . OSA (obstructive sleep apnea) 03/11/2012  . Elevated serum creatinine 03/11/2012    Expected Discharge Date: Expected Discharge Date: 03/29/12  Team Members Present: Physician: Dr. Claudette Laws Case Manager Present: Lutricia Horsfall, RN Social Worker Present: Dossie Der, LCSW Nurse Present: Laural Roes, RN PT Present: Edman Circle, PT;Becky [place], PT OT Present: Edwin Cap, OT SLP Present: Fae Pippin, SLP     Current Status/Progress Goal Weekly Team Focus  Medical   diabetic control improving, nausea resolved, urinary retention is intermittent  maintain diabetic control, encourage up to toilet for voiding  improved patient  self management for bowel and bladder   Bowel/Bladder   Continent of bowel and bladder  min assist      Swallow/Nutrition/ Hydration   regular textures and thin liquids via cup with left chin turn and tuck  least restrictive p.o. intake   continue plan of care and reassess readiness for repeat MBSS early next week   ADL's   Patient is currently Mod I for grooming; Supervision for UB dressing; Min A for bathing/LB dressing/toileting/toilet and shower transfer.  Min Assist for shower  transfers/toileting, Supervision for eating/bathing/toilet transfer/grooming, Mod I for UB dressing   A/ROM RUE; sitting and standing balance; safety awareness; ADL performance.   Mobility   min A w/c mobility on unit, mod A overall basic transfers, +2A for gait, stairs currently  Goals downgraded to supervision-min A level overall w/c level; gait with therapy only  transfers, gait with estim or AFO, trunk control/balance   Communication   min assist   supervision   increase self monitoring and correcting   Safety/Cognition/ Behavioral Observations  no unsafe behaviors; therapists report difficulty with recalling and carrying over information   supervision  education    Pain   Pain/spasm to right arm/left shoulder, managed with prn ultram and tylenol, scheduled norco  < or = 3      Skin   bruising to abdomen  no new skin breakdown         *See Interdisciplinary Assessment and Plan and progress notes for long and short-term goals  Barriers to Discharge: no 24 7 caregiver available    Possible Resolutions to Barriers:  sshort-term SNF placement    Discharge Planning/Teaching Needs:  Plan has changed to short term NHP.  Pt does not have 24 hour care at home      Team Discussion:  Pt with poor memory. DM controlled. Pt has agreed to transfer to SNF.  Revisions to Treatment Plan:     Continued Need for Acute Rehabilitation Level of Care: The patient requires daily medical management by a physician with specialized training in physical medicine and rehabilitation for the following conditions: Daily  direction of a multidisciplinary physical rehabilitation program to ensure safe treatment while eliciting the highest outcome that is of practical value to the patient.: Yes Daily medical management of patient stability for increased activity during participation in an intensive rehabilitation regime.: Yes Daily analysis of laboratory values and/or radiology reports with any subsequent need  for medication adjustment of medical intervention for : Neurological problems  Meryl Dare 03/28/2012, 10:30 AM

## 2012-03-28 NOTE — Progress Notes (Signed)
Speech Language Pathology Daily Session Note  Patient Details  Name: James Bell MRN: [id number] Date of Birth: 1931-03-01  Today's Date: 03/28/2012 Time: 1430-1500 Time Calculation (min): 30 min  Short Term Goals: Week 2: SLP Short Term Goal 1 (Week 2): Pt. will consume regular diet with thin liquids with minimal s/s aspiration demonstrating swallow precautions with supervision level assist  SLP Short Term Goal 2 (Week 2): Pt. will utilize diaphragmatic breathing in conversation for increased vocal control and increased words per breath with min verbal assist. SLP Short Term Goal 3 (Week 2): Pt. will attend to upper right extremity during activities and in his environment with supervision verbal/visual cues. SLP Short Term Goal 4 (Week 2): Pt. will demonstrate appropriate executive functioning abilities via diagnostic assessment given supervision cues.  Skilled Therapeutic Interventions: Treatment session focused on utilizing speech intelligibility strategies and diaphragmatic breathing at the conversational level. Pt required supervision verbal cues for utilization of over-articulation and increased vocal intensity.    FIM:  Comprehension Comprehension Mode: Auditory Comprehension: 5-Follows basic conversation/direction: With extra time/assistive device Expression Expression Mode: Verbal Expression: 5-Expresses basic 90% of the time/requires cueing < 10% of the time. Social Interaction Social Interaction: 5-Interacts appropriately 90% of the time - Needs monitoring or encouragement for participation or interaction. Problem Solving Problem Solving: 5-Solves basic 90% of the time/requires cueing < 10% of the time Memory Memory: 5-Recognizes or recalls 90% of the time/requires cueing < 10% of the time FIM - Eating Eating Activity: 5: Supervision/cues  Pain Pain Assessment Pain Assessment: No/denies pain  Therapy/Group: Individual Therapy  Chris Cripps 03/28/2012, 4:39  PM

## 2012-03-28 NOTE — Progress Notes (Signed)
Physical Therapy Discharge Summary  Patient Details  Name: James Bell MRN: [id number] Date of Birth: 04-18-1931  Today's Date: 03/28/2012 Time: 0900-1000 Time Calculation (min): 60 min  Patient has met 3 of 5 long term goals due to improved activity tolerance, improved balance, improved postural control, increased strength, ability to compensate for deficits, functional use of  right lower extremity, improved attention and improved coordination.  Patient to discharge at a wheelchair level supervision for w/c mobility and Mod Assist overall for bed mobility and basic transfers; still requires total A for standing and gait.  Patient's care partner (family and significant other) unavailable to provide the necessary physical, cognitive and 24/7 supervision assistance at discharge.  Patient to D/C to SNF for more long term rehabilitation to reach higher level of function prior to D/C home.  Reasons goals not met: Did not reach mod A standing balance and gait goal; continues to require max-total A +2 for standing/gait secondary to continued RLE buckling and decreased trunk control.  Patient to D/C to SNF for more long term rehabilitation to reach higher level of function prior to D/C home  Recommendation:  Patient will benefit from ongoing skilled PT services in skilled nursing facility setting to continue to advance safe functional mobility, address ongoing impairments in R sided hemiplegia, impaired postural control, motor control, timing, sequencing, impaired activity tolerance/endurance, impaired cognition, sequencing, sitting and standing balance, gait, and minimize fall risk.  Equipment: Deferred to next venue  Reasons for discharge: discharge from hospital  Patient/family agrees with progress made and goals achieved: Yes  PT Discharge Pain Pain Assessment Pain Assessment: No/denies pain Pain Score:   1 Pain Type: Acute pain Pain Location: Shoulder Pain Orientation:  Right Sensation Sensation Light Touch: Appears Intact Stereognosis: Not tested Hot/Cold: Not tested Proprioception: Appears Intact Coordination Gross Motor Movements are Fluid and Coordinated: No Motor  Motor Motor: Hemiplegia;Ataxia;Motor apraxia;Abnormal postural alignment and control Motor - Discharge Observations: R hemiplegia, impaired motor control, timing, sequencing, ataxia during gait  Mobility Bed Mobility Bed Mobility: Supine to Sit;Sit to Supine Rolling Right: 5: Supervision Rolling Left: 5: Supervision Supine to Sit: 5: Supervision Sit to Supine: 5: Supervision Transfers Stand Pivot Transfer Details (indicate cue type and reason): Performs squat pivots to R side with mod A secondary to occasional LOB to R; to L with min A initially; squat pivot and squat scoot training on mat to L and R with focus on maintaining trunk elongation with anterior lean and trunk rotation and activation of RLE extensors to pivot hips.  Performed multiple squat pivots to w/c to L and R with verbal cues for sequence; patient progressed to supervision-min A for squat pivot transfers. Locomotion  Ambulation Ambulation: Yes Ambulation/Gait Assistance: 1: +2 Total assist for safe management/advancement of walker, advancement and placement of RLE and cues for upright trunk and full anterior weight shift over RLE during stance.  Decreased knee buckling noted today Ambulation Distance (Feet): 10 Feet Assistive device: Building surveyor Distance: 150; began with bilat LE propulsion but required mod A for full RLE knee extension <> flexion and RLE fatigued quickly; able to perform with L hemi technique supervision Extremity Assessment   RLE Assessment RLE Assessment: Exceptions to [facility] RLE Strength RLE Overall Strength: Deficits RLE Overall Strength Comments: 3/5 hip flexion, knee extension and flexion; 2/5 ankle DF, activation of hip ABD and extensor muscles in WB position; poor ability to  sustain activation LLE Assessment LLE Assessment: Within Functional Limits  See FIM for current  functional status  [facility] 03/28/2012, 10:22 AM

## 2012-03-28 NOTE — Progress Notes (Signed)
Per State Regulation 482.30 This chart was reviewed for medical necessity with respect to the patient's Admission/Duration of stay. Lane-Morgan, Moustapha Tooker Anne                 Nurse Care Manager               

## 2012-03-28 NOTE — Progress Notes (Signed)
Pt placed oin cpap at this time, using our machine and his mask

## 2012-03-28 NOTE — Progress Notes (Signed)
Patient ID: ERCOLE GEORG, male   DOB: 08/23/1931, 78 y.o.   MRN: [id number] Patient ID: PINCHAS REITHER, male   DOB: [birth year], 78 y.o.   MRN: [id number] Subjective/Complaints:  Feels groggy from scheduled hs norco. No other complaints  Review of Systems  Musculoskeletal: Positive for joint pain.       R shoulder  Neurological: Positive for speech change.   Objective: Vital Signs: Blood pressure 104/65, pulse 71, temperature 97.8 F (36.6 C), temperature source Oral, resp. rate 18, height 5\' 9"  (1.753 m), weight 74.2 kg (163 lb 9.3 oz), SpO2 97.00%. No results found. Results for orders placed during the hospital encounter of 03/13/12 (from the past 72 hour(s))  GLUCOSE, CAPILLARY     Status: Abnormal   Collection Time   03/25/12  8:06 AM      Component Value Range Comment   Glucose-Capillary 108 (*) 70 - 99 mg/dL    Comment 1 Notify RN     GLUCOSE, CAPILLARY     Status: Abnormal   Collection Time   03/25/12 11:22 AM      Component Value Range Comment   Glucose-Capillary 125 (*) 70 - 99 mg/dL    Comment 1 Notify RN     GLUCOSE, CAPILLARY     Status: Abnormal   Collection Time   03/25/12  4:16 PM      Component Value Range Comment   Glucose-Capillary 152 (*) 70 - 99 mg/dL   GLUCOSE, CAPILLARY     Status: Normal   Collection Time   03/25/12  8:45 PM      Component Value Range Comment   Glucose-Capillary 91  70 - 99 mg/dL   GLUCOSE, CAPILLARY     Status: Normal   Collection Time   03/26/12  7:29 AM      Component Value Range Comment   Glucose-Capillary 87  70 - 99 mg/dL    Comment 1 Notify RN     GLUCOSE, CAPILLARY     Status: Abnormal   Collection Time   03/26/12 11:11 AM      Component Value Range Comment   Glucose-Capillary 120 (*) 70 - 99 mg/dL    Comment 1 STAT Lab      Comment 2 Notify RN     GLUCOSE, CAPILLARY     Status: Abnormal   Collection Time   03/26/12  4:41 PM      Component Value Range Comment   Glucose-Capillary 111 (*) 70 - 99 mg/dL   GLUCOSE,  CAPILLARY     Status: Abnormal   Collection Time   03/26/12  9:17 PM      Component Value Range Comment   Glucose-Capillary 133 (*) 70 - 99 mg/dL   GLUCOSE, CAPILLARY     Status: Normal   Collection Time   03/27/12  7:27 AM      Component Value Range Comment   Glucose-Capillary 76  70 - 99 mg/dL    Comment 1 Notify RN     GLUCOSE, CAPILLARY     Status: Abnormal   Collection Time   03/27/12 11:34 AM      Component Value Range Comment   Glucose-Capillary 174 (*) 70 - 99 mg/dL    Comment 1 Notify RN     GLUCOSE, CAPILLARY     Status: Abnormal   Collection Time   03/27/12  4:26 PM      Component Value Range Comment   Glucose-Capillary 110 (*) 70 - 99 mg/dL  GLUCOSE, CAPILLARY     Status: Abnormal   Collection Time   03/27/12  9:09 PM      Component Value Range Comment   Glucose-Capillary 125 (*) 70 - 99 mg/dL      HEENT: normal Cardio: RRR Resp: CTA B/L GI: BS positive Extremity:  No Edema Skin:   Intact Neuro: Alert/Oriented, Cranial Nerve Abnormalities R central VII, Normal Sensory, Abnormal Motor 2-/5 R shoulder protraction , biceps, wrists, and HI are tr to 1/5. and Dysarthric,  RLE 2-/5 hipextension, knee ext synergy, 0/5 R ankle Musc/Skel:  No pain with shoulder ROM  Assessment/Plan: 1. Functional deficits secondary to L putamen infarct with R HP, dysarthria and dysphagia plan is for SNF, medically ready when bed available.  PMR f/u in 1 month         FIM: FIM - Bathing Bathing Steps Patient Completed: Chest;Right Arm;Left Arm;Abdomen;Front perineal area;Buttocks;Right upper leg;Left upper leg;Right lower leg (including foot);Left lower leg (including foot) Bathing: 5: Supervision: Safety issues/verbal cues  FIM - Upper Body Dressing/Undressing Upper body dressing/undressing steps patient completed: Thread/unthread right sleeve of pullover shirt/dresss;Thread/unthread left sleeve of pullover shirt/dress;Put head through opening of pull over shirt/dress;Pull shirt over  trunk Upper body dressing/undressing: 5: Set-up assist to: Obtain clothing/put away FIM - Lower Body Dressing/Undressing Lower body dressing/undressing steps patient completed: Thread/unthread right pants leg;Thread/unthread left pants leg;Pull pants up/down Lower body dressing/undressing: 4: Steadying Assist  FIM - Toileting Toileting steps completed by patient: Adjust clothing prior to toileting;Performs perineal hygiene Toileting Assistive Devices: Grab bar or rail for support Toileting: 3: Mod-Patient completed 2 of 3 steps  FIM - Diplomatic Services operational officer Devices: Grab bars;Elevated toilet seat Toilet Transfers: 3-To toilet/BSC: Mod A (lift or lower assist);3-From toilet/BSC: Mod A (lift or lower assist)  FIM - Bed/Chair Transfer Bed/Chair Transfer Assistive Devices: Arm rests Bed/Chair Transfer: 4: Supine > Sit: Min A (steadying Pt. > 75%/lift 1 leg);4: Sit > Supine: Min A (steadying pt. > 75%/lift 1 leg);3: Bed > Chair or W/C: Mod A (lift or lower assist);3: Chair or W/C > Bed: Mod A (lift or lower assist)  FIM - Locomotion: Wheelchair Distance: 200, indoors in crowded environment over level surface and outdoors over uneven brick/cement and up and down slight incline with verbal cues for sequence to slow w/c down with grip; still requires verbal cues for sequencing and obstacle negotiation and to maintain straight path Locomotion: Wheelchair: 2: Travels 50 - 149 ft with supervision, cueing or coaxing FIM - Locomotion: Ambulation Locomotion: Ambulation Assistive Devices: Orthosis;Parallel bars Ambulation/Gait Assistance: 1: +1 Total assist Locomotion: Ambulation: 1: Travels less than 50 ft with total assistance/helper does all (Pt.<25%)  Comprehension Comprehension Mode: Auditory Comprehension: 5-Follows basic conversation/direction: With extra time/assistive device  Expression Expression Mode: Verbal Expression: 5-Expresses basic 90% of the time/requires  cueing < 10% of the time.  Social Interaction Social Interaction: 5-Interacts appropriately 90% of the time - Needs monitoring or encouragement for participation or interaction.  Problem Solving Problem Solving: 5-Solves basic 90% of the time/requires cueing < 10% of the time  Memory Memory: 5-Recognizes or recalls 90% of the time/requires cueing < 10% of the time  Medical Problem List and Plan:  1. 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 DVT Prophylaxis/Anticoagulation: Subcutaneous Lovenox. Monitor platelet counts and any signs of bleeding  3. Dysphagia. Mechanical soft with nectar liquids. Monitor for any signs of aspiration. Speech therapy to followup  4. Neuropsych: This patient is capable of  making decisions on his/her own behalf.  5. Diabetes mellitus.controlled Hemoglobin A1c 6.3.Increase Lantus insulin 50 units each bedtime. Patient on 45 units Lantus prior to admission as well as janumet 50-100 twice a day. Check blood sugars a.c. and at bedtime. Pt would rather try a higher dose of Lantus than restart oral agents.  .  Add CHO mod diet 6. Hypertension. Lisinopril 10 mg daily. Monitor with increased mobility  7. Hyperlipidemia. Lipitor  8. Restless leg syndrome. Continue Mirapex 1 mg daily  9. Obstructive sleep apnea. CPAP. Patient has been using a CPAP machine for approximately 2 years. He is using his face mask from home. 10. urinary retention  Improved off flomax, nausea resolved 11. Insomnia. Zolpidem prn is helping 12.  R shoulder pain mainly at night will give hydrocodone qhs--change to prn LOS (Days) 15 A FACE TO FACE EVALUATION WAS PERFORMED  Travion Ke T 03/28/2012, 7:30 AM

## 2012-03-29 LAB — GLUCOSE, CAPILLARY
Glucose-Capillary: 70 mg/dL (ref 70–99)
Glucose-Capillary: 125 mg/dL — ABNORMAL HIGH (ref 70–99)
Glucose-Capillary: 144 mg/dL — ABNORMAL HIGH (ref 70–99)
Glucose-Capillary: 128 mg/dL — ABNORMAL HIGH (ref 70–99)

## 2012-03-29 NOTE — Progress Notes (Signed)
Social Work Patient ID: James Bell, male   DOB: [birth year], 78 y.o.   MRN: [id number] Spoke with Tammie from [facility] they would not have a room available until 6:00pm and it is a semi-private room. Discussed a private room would be available tomorrow am.  Both daughter and pt prefer this option.  Contacted MD and he is agreeable to this. Plan now is to transfer pt tomorrow to [facility].  Discharge recended for today. Judy-RN aware and aware to order pt lunch for today. Plan for transfer tomorrow.

## 2012-03-29 NOTE — Progress Notes (Signed)
Nursing Note: Reminded pt that it had been 2 days since he had a bm and pt states he had a bm on 03/27/12 and refused meds for constipationwbb

## 2012-03-29 NOTE — Progress Notes (Signed)
Social Work Patient ID: James Bell, male   DOB: December 16, 1930, 78 y.o.   MRN: [id number] Met with pt and daughter to confirm discharge tomorrow.  Plan to schedule amb tomorrow at 10:00. Paperwork in [place] and Judy-RN aware of the plan.  She is here tomorrow and will make sure transferred. Daughter aware if questions to contact this worker.

## 2012-03-29 NOTE — Progress Notes (Signed)
Subjective/Complaints:  Feels more alert today. No other complaints  Review of Systems  Musculoskeletal: Positive for joint pain.       R shoulder  Neurological: Positive for speech change.   Objective: Vital Signs: Blood pressure 122/61, pulse 72, temperature 97.3 F (36.3 C), temperature source Oral, resp. rate 18, height 5\' 9"  (1.753 m), weight 74.2 kg (163 lb 9.3 oz), SpO2 99.00%. No results found. Results for orders placed during the hospital encounter of 03/13/12 (from the past 72 hour(s))  GLUCOSE, CAPILLARY     Status: Abnormal   Collection Time   03/26/12 11:11 AM      Component Value Range Comment   Glucose-Capillary 120 (*) 70 - 99 mg/dL    Comment 1 STAT Lab      Comment 2 Notify RN     GLUCOSE, CAPILLARY     Status: Abnormal   Collection Time   03/26/12  4:41 PM      Component Value Range Comment   Glucose-Capillary 111 (*) 70 - 99 mg/dL   GLUCOSE, CAPILLARY     Status: Abnormal   Collection Time   03/26/12  9:17 PM      Component Value Range Comment   Glucose-Capillary 133 (*) 70 - 99 mg/dL   GLUCOSE, CAPILLARY     Status: Normal   Collection Time   03/27/12  7:27 AM      Component Value Range Comment   Glucose-Capillary 76  70 - 99 mg/dL    Comment 1 Notify RN     GLUCOSE, CAPILLARY     Status: Abnormal   Collection Time   03/27/12 11:34 AM      Component Value Range Comment   Glucose-Capillary 174 (*) 70 - 99 mg/dL    Comment 1 Notify RN     GLUCOSE, CAPILLARY     Status: Abnormal   Collection Time   03/27/12  4:26 PM      Component Value Range Comment   Glucose-Capillary 110 (*) 70 - 99 mg/dL   GLUCOSE, CAPILLARY     Status: Abnormal   Collection Time   03/27/12  9:09 PM      Component Value Range Comment   Glucose-Capillary 125 (*) 70 - 99 mg/dL   GLUCOSE, CAPILLARY     Status: Abnormal   Collection Time   03/28/12  8:04 AM      Component Value Range Comment   Glucose-Capillary 104 (*) 70 - 99 mg/dL    Comment 1 Notify RN      Comment 2 Documented  in Chart     GLUCOSE, CAPILLARY     Status: Abnormal   Collection Time   03/28/12 12:07 PM      Component Value Range Comment   Glucose-Capillary 174 (*) 70 - 99 mg/dL    Comment 1 Notify RN      Comment 2 Documented in Chart     GLUCOSE, CAPILLARY     Status: Abnormal   Collection Time   03/28/12  4:28 PM      Component Value Range Comment   Glucose-Capillary 107 (*) 70 - 99 mg/dL   GLUCOSE, CAPILLARY     Status: Abnormal   Collection Time   03/28/12  9:25 PM      Component Value Range Comment   Glucose-Capillary 224 (*) 70 - 99 mg/dL   GLUCOSE, CAPILLARY     Status: Normal   Collection Time   03/29/12  7:27 AM  Component Value Range Comment   Glucose-Capillary 70  70 - 99 mg/dL    Comment 1 Notify RN        HEENT: normal Cardio: RRR Resp: CTA B/L GI: BS positive Extremity:  No Edema Skin:   Intact Neuro: Alert/Oriented, Cranial Nerve Abnormalities R central VII, Normal Sensory, Abnormal Motor 2-/5 R shoulder protraction , biceps, wrists, and HI are tr to 1/5. and Dysarthria minimal,  RLE 2-/5 hipextension, knee ext synergy, 0/5 R ankle Musc/Skel:  No pain with shoulder ROM  Assessment/Plan: 1. Functional deficits secondary to L putamen infarct with R HP, dysarthria and dysphagia plan is for SNF, medically ready when bed available.   For dc today          FIM: FIM - Bathing Bathing Steps Patient Completed: Chest;Right Arm;Left Arm;Abdomen;Front perineal area;Buttocks;Right upper leg;Left upper leg;Right lower leg (including foot);Left lower leg (including foot) Bathing: 4: Min-Patient completes 8-9 67f 10 parts or 75+ percent  FIM - Upper Body Dressing/Undressing Upper body dressing/undressing steps patient completed: Thread/unthread right sleeve of pullover shirt/dresss;Thread/unthread left sleeve of pullover shirt/dress;Put head through opening of pull over shirt/dress;Pull shirt over trunk Upper body dressing/undressing: 6: More than reasonable amount of time FIM -  Lower Body Dressing/Undressing Lower body dressing/undressing steps patient completed: Thread/unthread right pants leg;Thread/unthread left pants leg;Pull pants up/down;Don/Doff right shoe;Don/Doff left shoe;Fasten/unfasten right shoe;Fasten/unfasten left shoe Lower body dressing/undressing: 4: Steadying Assist  FIM - Toileting Toileting steps completed by patient: Adjust clothing prior to toileting;Performs perineal hygiene;Adjust clothing after toileting Toileting Assistive Devices: Grab bar or rail for support Toileting: 4: Steadying assist  FIM - Diplomatic Services operational officer Devices: Elevated toilet seat;Grab bars Toilet Transfers: 4-To toilet/BSC: Min A (steadying Pt. > 75%);4-From toilet/BSC: Min A (steadying Pt. > 75%)  FIM - Bed/Chair Transfer Bed/Chair Transfer Assistive Devices: Arm rests Bed/Chair Transfer: 5: Supine > Sit: Supervision (verbal cues/safety issues);5: Sit > Supine: Supervision (verbal cues/safety issues);4: Bed > Chair or W/C: Min A (steadying Pt. > 75%);4: Chair or W/C > Bed: Min A (steadying Pt. > 75%)  FIM - Locomotion: Wheelchair Distance: 150 Locomotion: Wheelchair: 5: Travels 150 ft or more: maneuvers on rugs and over door sills with supervision, cueing or coaxing FIM - Locomotion: Ambulation Locomotion: Ambulation Assistive Devices: Interior and spatial designer Ambulation/Gait Assistance: 1: +2 Total assist Locomotion: Ambulation: 1: Two helpers  Comprehension Comprehension Mode: Auditory Comprehension: 5-Understands complex 90% of the time/Cues < 10% of the time  Expression Expression Mode: Verbal Expression: 5-Expresses complex 90% of the time/cues < 10% of the time  Social Interaction Social Interaction: 5-Interacts appropriately 90% of the time - Needs monitoring or encouragement for participation or interaction.  Problem Solving Problem Solving: 5-Solves basic 90% of the time/requires cueing < 10% of the time  Memory Memory: 5-Recognizes or  recalls 90% of the time/requires cueing < 10% of the time  Medical Problem List and Plan:  1. 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 DVT Prophylaxis/Anticoagulation: Subcutaneous Lovenox. Monitor platelet counts and any signs of bleeding  3. Dysphagia. Mechanical soft with nectar liquids. Monitor for any signs of aspiration. Speech therapy to followup  4. Neuropsych: This patient is capable of making decisions on his/her own behalf.  5. Diabetes mellitus.controlled Hemoglobin A1c 6.3.Increase Lantus insulin 50 units each bedtime. Patient on 45 units Lantus prior to admission as well as janumet 50-100 twice a day. Check blood sugars a.c. and at bedtime. Pt would rather try a higher dose of  Lantus than restart oral agents.  CHO mod diet 6. Hypertension. Lisinopril 10 mg daily. Monitor with increased mobility  7. Hyperlipidemia. Lipitor  8. Restless leg syndrome. Continue Mirapex 1 mg daily  9. Obstructive sleep apnea. CPAP. Patient has been using a CPAP machine for approximately 2 years. He is using his face mask from home. 10. urinary retention  Improved off flomax, nausea resolved 11. Insomnia. Zolpidem prn is helping 12.  R shoulder pain mainly at night will give hydrocodone qhs--changed to prn LOS (Days) 16 A FACE TO FACE EVALUATION WAS PERFORMED  Rihaan Barrack T 03/29/2012, 7:45 AM

## 2012-03-29 NOTE — Progress Notes (Signed)
Social Work Discharge Note Discharge Note  The overall goal for the admission was met for:   Discharge location: Yes-[facility]  Length of Stay: Yes-16 DAYS  Discharge activity level: Yes-MIN LEVEL  Home/community participation: Yes  Services provided included: MD, RD, PT, OT, SLP, RN, CM, TR, Pharmacy and SW  Financial Services: Medicare and Private Insurance: [employer]  Follow-up services arranged: Other: SHORT TERM NHP  Comments (or additional information):PT NEEDS TO BE HIGHER LEVEL BEFORE RETURNING HOME  Patient/Family verbalized understanding of follow-up arrangements: Yes  Individual responsible for coordination of the follow-up plan: [facility] AND PT  Confirmed correct DME delivered: Lucy Chris 03/29/2012    Lucy Chris

## 2012-03-29 NOTE — Progress Notes (Signed)
Speech Language Pathology Discharge Summary  Patient Details  Name: James Bell MRN: [id number] Date of Birth: 1931-03-11  Today's Date: 03/29/2012  Patient has met 5 of 5 long term goals.  Patient to discharge at overall Supervision level.  Reasons goals not met: N/A   Clinical Impression/Discharge Summary: Patient has demonstrated fucntional gains during rehab stay in the areas of dysphagia and verbal expression.  Patient has progressed from a Dys.3 nectar-thick liquid diet to regular textures and thin liquids with intermittent superviison cues to utilize a left head tilt and chin tuck, which prevents penetration per MBSS.  Paitent also requires supervision semantic cues to utilize diaphragmatic breahing duirng conversational speech to facilitate increased speech intelligibility.  Patient would continue to benefit from skilled SLP services to [facility] safety with swallowing and facilitate PLOF p.o. consumption. SLP also recommends continued diagnostic evauation of higher level cognition due to short term memory difficulties reported by PT.    Care Partner:  Caregiver Able to Provide Assistance: No  Type of Caregiver Assistance: Physical  Recommendation:  Skilled Nursing facility  Rationale for SLP Follow Up: Maximize cognitive function and independence;Maximize swallowing safety;Reduce caregiver burden   Equipment: None   Reasons for discharge: Treatment goals met;Discharged from hospital   Patient/Family Agrees with Progress Made and Goals Achieved: Yes   See FIM for current functional status  Charlane Ferretti., CCC-SLP [phone]  Cloyd Ragas 03/29/2012, 3:57 PM

## 2012-03-30 DIAGNOSIS — R279 Unspecified lack of coordination: Secondary | ICD-10-CM | POA: Diagnosis not present

## 2012-03-30 DIAGNOSIS — I69928 Other speech and language deficits following unspecified cerebrovascular disease: Secondary | ICD-10-CM | POA: Diagnosis not present

## 2012-03-30 DIAGNOSIS — R498 Other voice and resonance disorders: Secondary | ICD-10-CM | POA: Diagnosis not present

## 2012-03-30 DIAGNOSIS — R269 Unspecified abnormalities of gait and mobility: Secondary | ICD-10-CM | POA: Diagnosis not present

## 2012-03-30 DIAGNOSIS — M6281 Muscle weakness (generalized): Secondary | ICD-10-CM | POA: Diagnosis not present

## 2012-03-30 DIAGNOSIS — I69959 Hemiplegia and hemiparesis following unspecified cerebrovascular disease affecting unspecified side: Secondary | ICD-10-CM | POA: Diagnosis not present

## 2012-03-30 DIAGNOSIS — G4733 Obstructive sleep apnea (adult) (pediatric): Secondary | ICD-10-CM | POA: Diagnosis not present

## 2012-03-30 DIAGNOSIS — G459 Transient cerebral ischemic attack, unspecified: Secondary | ICD-10-CM | POA: Diagnosis not present

## 2012-03-30 DIAGNOSIS — G40909 Epilepsy, unspecified, not intractable, without status epilepticus: Secondary | ICD-10-CM | POA: Diagnosis not present

## 2012-03-30 DIAGNOSIS — Z5189 Encounter for other specified aftercare: Secondary | ICD-10-CM | POA: Diagnosis not present

## 2012-03-30 LAB — GLUCOSE, CAPILLARY
Glucose-Capillary: 69 mg/dL — ABNORMAL LOW (ref 70–99)
Glucose-Capillary: 94 mg/dL (ref 70–99)

## 2012-03-30 NOTE — Progress Notes (Signed)
Pt and daughter at bedside, went over the process of transferring via stretcher/ambulance to [facility] and answered any questions they  Had, to front door via stretcher at this time

## 2012-03-30 NOTE — Progress Notes (Signed)
Patient ID: James Bell, male   DOB: 11/13/1930, 78 y.o.   MRN: [id number] Subjective/Complaints:  SNF transfer delayed until today No other complaints  Review of Systems  Musculoskeletal: Positive for joint pain.       R shoulder  Neurological: Positive for speech change.   Objective: Vital Signs: Blood pressure 115/57, pulse 81, temperature 98.6 F (37 C), temperature source Oral, resp. rate 18, height 5\' 9"  (1.753 m), weight 74.2 kg (163 lb 9.3 oz), SpO2 99.00%. No results found. Results for orders placed during the hospital encounter of 03/13/12 (from the past 72 hour(s))  GLUCOSE, CAPILLARY     Status: Normal   Collection Time   03/27/12  7:27 AM      Component Value Range Comment   Glucose-Capillary 76  70 - 99 mg/dL    Comment 1 Notify RN     GLUCOSE, CAPILLARY     Status: Abnormal   Collection Time   03/27/12 11:34 AM      Component Value Range Comment   Glucose-Capillary 174 (*) 70 - 99 mg/dL    Comment 1 Notify RN     GLUCOSE, CAPILLARY     Status: Abnormal   Collection Time   03/27/12  4:26 PM      Component Value Range Comment   Glucose-Capillary 110 (*) 70 - 99 mg/dL   GLUCOSE, CAPILLARY     Status: Abnormal   Collection Time   03/27/12  9:09 PM      Component Value Range Comment   Glucose-Capillary 125 (*) 70 - 99 mg/dL   GLUCOSE, CAPILLARY     Status: Abnormal   Collection Time   03/28/12  8:04 AM      Component Value Range Comment   Glucose-Capillary 104 (*) 70 - 99 mg/dL    Comment 1 Notify RN      Comment 2 Documented in Chart     GLUCOSE, CAPILLARY     Status: Abnormal   Collection Time   03/28/12 12:07 PM      Component Value Range Comment   Glucose-Capillary 174 (*) 70 - 99 mg/dL    Comment 1 Notify RN      Comment 2 Documented in Chart     GLUCOSE, CAPILLARY     Status: Abnormal   Collection Time   03/28/12  4:28 PM      Component Value Range Comment   Glucose-Capillary 107 (*) 70 - 99 mg/dL   GLUCOSE, CAPILLARY     Status: Abnormal   Collection Time   03/28/12  9:25 PM      Component Value Range Comment   Glucose-Capillary 224 (*) 70 - 99 mg/dL   GLUCOSE, CAPILLARY     Status: Normal   Collection Time   03/29/12  7:27 AM      Component Value Range Comment   Glucose-Capillary 70  70 - 99 mg/dL    Comment 1 Notify RN     GLUCOSE, CAPILLARY     Status: Abnormal   Collection Time   03/29/12 11:37 AM      Component Value Range Comment   Glucose-Capillary 125 (*) 70 - 99 mg/dL    Comment 1 Notify RN     GLUCOSE, CAPILLARY     Status: Abnormal   Collection Time   03/29/12  4:30 PM      Component Value Range Comment   Glucose-Capillary 144 (*) 70 - 99 mg/dL   GLUCOSE, CAPILLARY     Status:  Abnormal   Collection Time   03/29/12  9:19 PM      Component Value Range Comment   Glucose-Capillary 128 (*) 70 - 99 mg/dL      HEENT: normal Cardio: RRR Resp: CTA B/L GI: BS positive Extremity:  No Edema Skin:   Intact Neuro: Alert/Oriented, Cranial Nerve Abnormalities R central VII, Normal Sensory, Abnormal Motor 2-/5 R shoulder protraction , biceps, wrists, and HI are tr to 1/5. and Dysarthria minimal,  RLE 2-/5 hipextension, knee ext synergy, 0/5 R ankle Musc/Skel:  No pain with shoulder ROM  Assessment/Plan: 1. Functional deficits secondary to L putamen infarct with R HP, dysarthria and dysphagia plan is for SNF, medically ready when bed available.   For dc today          FIM: FIM - Bathing Bathing Steps Patient Completed: Chest;Right Arm;Left Arm;Abdomen;Front perineal area;Buttocks;Right upper leg;Left upper leg;Right lower leg (including foot);Left lower leg (including foot) Bathing: 4: Min-Patient completes 8-9 13f 10 parts or 75+ percent  FIM - Upper Body Dressing/Undressing Upper body dressing/undressing steps patient completed: Thread/unthread right sleeve of pullover shirt/dresss;Thread/unthread left sleeve of pullover shirt/dress;Put head through opening of pull over shirt/dress;Pull shirt over trunk Upper body  dressing/undressing: 6: More than reasonable amount of time FIM - Lower Body Dressing/Undressing Lower body dressing/undressing steps patient completed: Thread/unthread right pants leg;Thread/unthread left pants leg;Pull pants up/down;Don/Doff right shoe;Don/Doff left shoe;Fasten/unfasten right shoe;Fasten/unfasten left shoe Lower body dressing/undressing: 4: Steadying Assist  FIM - Toileting Toileting steps completed by patient: Performs perineal hygiene Toileting Assistive Devices: Grab bar or rail for support Toileting: 1: Two helpers  FIM - Diplomatic Services operational officer Devices: Therapist, music Transfers: 1-Two helpers  FIM - Banker Devices: Arm rests Bed/Chair Transfer: 5: Supine > Sit: Supervision (verbal cues/safety issues);5: Sit > Supine: Supervision (verbal cues/safety issues);4: Bed > Chair or W/C: Min A (steadying Pt. > 75%);4: Chair or W/C > Bed: Min A (steadying Pt. > 75%)  FIM - Locomotion: Wheelchair Distance: 150 Locomotion: Wheelchair: 5: Travels 150 ft or more: maneuvers on rugs and over door sills with supervision, cueing or coaxing FIM - Locomotion: Ambulation Locomotion: Ambulation Assistive Devices: Interior and spatial designer Ambulation/Gait Assistance: 1: +2 Total assist Locomotion: Ambulation: 1: Two helpers  Comprehension Comprehension Mode: Auditory Comprehension: 5-Follows basic conversation/direction: With extra time/assistive device  Expression Expression Mode: Verbal Expression: 5-Expresses complex 90% of the time/cues < 10% of the time  Social Interaction Social Interaction: 5-Interacts appropriately 90% of the time - Needs monitoring or encouragement for participation or interaction.  Problem Solving Problem Solving: 5-Solves complex 90% of the time/cues < 10% of the time  Memory Memory: 5-Recognizes or recalls 90% of the time/requires cueing < 10% of the time  Medical Problem List and Plan:  1.  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 DVT Prophylaxis/Anticoagulation: Subcutaneous Lovenox. Monitor platelet counts and any signs of bleeding  3. Dysphagia. Mechanical soft with nectar liquids. Monitor for any signs of aspiration. Speech therapy to followup  4. Neuropsych: This patient is capable of making decisions on his/her own behalf.  5. Diabetes mellitus.controlled Hemoglobin A1c 6.3.Increase Lantus insulin 50 units each bedtime. Patient on 45 units Lantus prior to admission as well as janumet 50-100 twice a day. Check blood sugars a.c. and at bedtime. Pt would rather try a higher dose of Lantus than restart oral agents.  CHO mod diet 6. Hypertension. Lisinopril 10 mg daily. Monitor with increased mobility  7. Hyperlipidemia. Lipitor  8. Restless leg syndrome. Continue Mirapex 1 mg daily  9. Obstructive sleep apnea. CPAP. Patient has been using a CPAP machine for approximately 2 years. He is using his face mask from home. 10. urinary retention  Improved off flomax, nausea resolved 11. Insomnia. Zolpidem prn is helping 12.  R shoulder pain mainly at night will give hydrocodone qhs--changed to prn LOS (Days) 17 A FACE TO FACE EVALUATION WAS PERFORMED  James Bell T 03/30/2012, 6:45 AM

## 2012-03-30 NOTE — Progress Notes (Signed)
Pt refusing to wear CPAP tonight. RT will continue to monitor. 

## 2012-05-03 ENCOUNTER — Inpatient Hospital Stay: Payer: Medicare Other | Admitting: Physical Medicine & Rehabilitation

## 2012-05-12 DIAGNOSIS — E119 Type 2 diabetes mellitus without complications: Secondary | ICD-10-CM | POA: Diagnosis not present

## 2012-05-12 DIAGNOSIS — I10 Essential (primary) hypertension: Secondary | ICD-10-CM | POA: Diagnosis not present

## 2012-05-12 DIAGNOSIS — I69959 Hemiplegia and hemiparesis following unspecified cerebrovascular disease affecting unspecified side: Secondary | ICD-10-CM | POA: Diagnosis not present

## 2012-05-12 DIAGNOSIS — G2581 Restless legs syndrome: Secondary | ICD-10-CM | POA: Diagnosis not present

## 2012-05-12 DIAGNOSIS — G4733 Obstructive sleep apnea (adult) (pediatric): Secondary | ICD-10-CM | POA: Diagnosis not present

## 2012-05-13 DIAGNOSIS — I69959 Hemiplegia and hemiparesis following unspecified cerebrovascular disease affecting unspecified side: Secondary | ICD-10-CM | POA: Diagnosis not present

## 2012-05-13 DIAGNOSIS — E119 Type 2 diabetes mellitus without complications: Secondary | ICD-10-CM | POA: Diagnosis not present

## 2012-05-13 DIAGNOSIS — I10 Essential (primary) hypertension: Secondary | ICD-10-CM | POA: Diagnosis not present

## 2012-05-13 DIAGNOSIS — G4733 Obstructive sleep apnea (adult) (pediatric): Secondary | ICD-10-CM | POA: Diagnosis not present

## 2012-05-13 DIAGNOSIS — G2581 Restless legs syndrome: Secondary | ICD-10-CM | POA: Diagnosis not present

## 2012-05-14 DIAGNOSIS — G4733 Obstructive sleep apnea (adult) (pediatric): Secondary | ICD-10-CM | POA: Diagnosis not present

## 2012-05-14 DIAGNOSIS — G2581 Restless legs syndrome: Secondary | ICD-10-CM | POA: Diagnosis not present

## 2012-05-14 DIAGNOSIS — E119 Type 2 diabetes mellitus without complications: Secondary | ICD-10-CM | POA: Diagnosis not present

## 2012-05-14 DIAGNOSIS — I10 Essential (primary) hypertension: Secondary | ICD-10-CM | POA: Diagnosis not present

## 2012-05-14 DIAGNOSIS — I69959 Hemiplegia and hemiparesis following unspecified cerebrovascular disease affecting unspecified side: Secondary | ICD-10-CM | POA: Diagnosis not present

## 2012-05-15 ENCOUNTER — Telehealth: Payer: Self-pay

## 2012-05-15 DIAGNOSIS — G4733 Obstructive sleep apnea (adult) (pediatric): Secondary | ICD-10-CM | POA: Diagnosis not present

## 2012-05-15 DIAGNOSIS — E119 Type 2 diabetes mellitus without complications: Secondary | ICD-10-CM | POA: Diagnosis not present

## 2012-05-15 DIAGNOSIS — I69959 Hemiplegia and hemiparesis following unspecified cerebrovascular disease affecting unspecified side: Secondary | ICD-10-CM | POA: Diagnosis not present

## 2012-05-15 DIAGNOSIS — I10 Essential (primary) hypertension: Secondary | ICD-10-CM | POA: Diagnosis not present

## 2012-05-15 DIAGNOSIS — G2581 Restless legs syndrome: Secondary | ICD-10-CM | POA: Diagnosis not present

## 2012-05-15 NOTE — Telephone Encounter (Signed)
RN calling on behalf of pt.  Pt was discharged taking plavix but after he left [facility] he was not given a script.  RN would like to know if Dr Wynn Banker is managing this or if pt should be on this medication.

## 2012-05-15 NOTE — Telephone Encounter (Signed)
Lm advising James Bell with [name] Dr Wynn Banker will not manage plavix and pt would need to contact pcp

## 2012-05-16 DIAGNOSIS — I10 Essential (primary) hypertension: Secondary | ICD-10-CM | POA: Diagnosis not present

## 2012-05-16 DIAGNOSIS — E119 Type 2 diabetes mellitus without complications: Secondary | ICD-10-CM | POA: Diagnosis not present

## 2012-05-16 DIAGNOSIS — G4733 Obstructive sleep apnea (adult) (pediatric): Secondary | ICD-10-CM | POA: Diagnosis not present

## 2012-05-16 DIAGNOSIS — G2581 Restless legs syndrome: Secondary | ICD-10-CM | POA: Diagnosis not present

## 2012-05-16 DIAGNOSIS — I69959 Hemiplegia and hemiparesis following unspecified cerebrovascular disease affecting unspecified side: Secondary | ICD-10-CM | POA: Diagnosis not present

## 2012-05-17 DIAGNOSIS — I69959 Hemiplegia and hemiparesis following unspecified cerebrovascular disease affecting unspecified side: Secondary | ICD-10-CM | POA: Diagnosis not present

## 2012-05-17 DIAGNOSIS — I10 Essential (primary) hypertension: Secondary | ICD-10-CM | POA: Diagnosis not present

## 2012-05-17 DIAGNOSIS — G4733 Obstructive sleep apnea (adult) (pediatric): Secondary | ICD-10-CM | POA: Diagnosis not present

## 2012-05-17 DIAGNOSIS — E119 Type 2 diabetes mellitus without complications: Secondary | ICD-10-CM | POA: Diagnosis not present

## 2012-05-17 DIAGNOSIS — G2581 Restless legs syndrome: Secondary | ICD-10-CM | POA: Diagnosis not present

## 2012-05-20 DIAGNOSIS — E119 Type 2 diabetes mellitus without complications: Secondary | ICD-10-CM | POA: Diagnosis not present

## 2012-05-20 DIAGNOSIS — G4733 Obstructive sleep apnea (adult) (pediatric): Secondary | ICD-10-CM | POA: Diagnosis not present

## 2012-05-20 DIAGNOSIS — I69959 Hemiplegia and hemiparesis following unspecified cerebrovascular disease affecting unspecified side: Secondary | ICD-10-CM | POA: Diagnosis not present

## 2012-05-20 DIAGNOSIS — G2581 Restless legs syndrome: Secondary | ICD-10-CM | POA: Diagnosis not present

## 2012-05-20 DIAGNOSIS — I10 Essential (primary) hypertension: Secondary | ICD-10-CM | POA: Diagnosis not present

## 2012-05-21 DIAGNOSIS — G2581 Restless legs syndrome: Secondary | ICD-10-CM | POA: Diagnosis not present

## 2012-05-21 DIAGNOSIS — E785 Hyperlipidemia, unspecified: Secondary | ICD-10-CM | POA: Diagnosis not present

## 2012-05-21 DIAGNOSIS — Z5181 Encounter for therapeutic drug level monitoring: Secondary | ICD-10-CM | POA: Diagnosis not present

## 2012-05-21 DIAGNOSIS — E786 Lipoprotein deficiency: Secondary | ICD-10-CM | POA: Diagnosis not present

## 2012-05-21 DIAGNOSIS — E1142 Type 2 diabetes mellitus with diabetic polyneuropathy: Secondary | ICD-10-CM | POA: Diagnosis not present

## 2012-05-21 DIAGNOSIS — E1149 Type 2 diabetes mellitus with other diabetic neurological complication: Secondary | ICD-10-CM | POA: Diagnosis not present

## 2012-05-21 DIAGNOSIS — I10 Essential (primary) hypertension: Secondary | ICD-10-CM | POA: Diagnosis not present

## 2012-05-21 DIAGNOSIS — I69959 Hemiplegia and hemiparesis following unspecified cerebrovascular disease affecting unspecified side: Secondary | ICD-10-CM | POA: Diagnosis not present

## 2012-05-21 DIAGNOSIS — E119 Type 2 diabetes mellitus without complications: Secondary | ICD-10-CM | POA: Diagnosis not present

## 2012-05-21 DIAGNOSIS — G4733 Obstructive sleep apnea (adult) (pediatric): Secondary | ICD-10-CM | POA: Diagnosis not present

## 2012-05-22 DIAGNOSIS — G4733 Obstructive sleep apnea (adult) (pediatric): Secondary | ICD-10-CM | POA: Diagnosis not present

## 2012-05-22 DIAGNOSIS — E119 Type 2 diabetes mellitus without complications: Secondary | ICD-10-CM | POA: Diagnosis not present

## 2012-05-22 DIAGNOSIS — I10 Essential (primary) hypertension: Secondary | ICD-10-CM | POA: Diagnosis not present

## 2012-05-22 DIAGNOSIS — G2581 Restless legs syndrome: Secondary | ICD-10-CM | POA: Diagnosis not present

## 2012-05-22 DIAGNOSIS — I69959 Hemiplegia and hemiparesis following unspecified cerebrovascular disease affecting unspecified side: Secondary | ICD-10-CM | POA: Diagnosis not present

## 2012-05-23 DIAGNOSIS — L981 Factitial dermatitis: Secondary | ICD-10-CM | POA: Diagnosis not present

## 2012-05-23 DIAGNOSIS — Z85828 Personal history of other malignant neoplasm of skin: Secondary | ICD-10-CM | POA: Diagnosis not present

## 2012-05-23 DIAGNOSIS — L821 Other seborrheic keratosis: Secondary | ICD-10-CM | POA: Diagnosis not present

## 2012-05-23 DIAGNOSIS — L719 Rosacea, unspecified: Secondary | ICD-10-CM | POA: Diagnosis not present

## 2012-05-24 ENCOUNTER — Ambulatory Visit (HOSPITAL_BASED_OUTPATIENT_CLINIC_OR_DEPARTMENT_OTHER): Payer: Medicare Other | Admitting: Physical Medicine & Rehabilitation

## 2012-05-24 ENCOUNTER — Encounter: Payer: Self-pay | Admitting: Physical Medicine & Rehabilitation

## 2012-05-24 ENCOUNTER — Encounter: Payer: Medicare Other | Attending: Physical Medicine & Rehabilitation

## 2012-05-24 VITALS — BP 118/60 | Resp 12 | Ht 69.0 in | Wt 176.4 lb

## 2012-05-24 DIAGNOSIS — I10 Essential (primary) hypertension: Secondary | ICD-10-CM | POA: Diagnosis not present

## 2012-05-24 DIAGNOSIS — M25819 Other specified joint disorders, unspecified shoulder: Secondary | ICD-10-CM | POA: Insufficient documentation

## 2012-05-24 DIAGNOSIS — M79609 Pain in unspecified limb: Secondary | ICD-10-CM | POA: Insufficient documentation

## 2012-05-24 DIAGNOSIS — G8110 Spastic hemiplegia affecting unspecified side: Secondary | ICD-10-CM | POA: Diagnosis not present

## 2012-05-24 DIAGNOSIS — E119 Type 2 diabetes mellitus without complications: Secondary | ICD-10-CM | POA: Diagnosis not present

## 2012-05-24 DIAGNOSIS — E786 Lipoprotein deficiency: Secondary | ICD-10-CM | POA: Diagnosis not present

## 2012-05-24 DIAGNOSIS — I69959 Hemiplegia and hemiparesis following unspecified cerebrovascular disease affecting unspecified side: Secondary | ICD-10-CM | POA: Insufficient documentation

## 2012-05-24 DIAGNOSIS — Z5181 Encounter for therapeutic drug level monitoring: Secondary | ICD-10-CM | POA: Diagnosis not present

## 2012-05-24 DIAGNOSIS — Z79899 Other long term (current) drug therapy: Secondary | ICD-10-CM | POA: Diagnosis not present

## 2012-05-24 DIAGNOSIS — G4733 Obstructive sleep apnea (adult) (pediatric): Secondary | ICD-10-CM | POA: Diagnosis not present

## 2012-05-24 DIAGNOSIS — G2581 Restless legs syndrome: Secondary | ICD-10-CM | POA: Diagnosis not present

## 2012-05-24 DIAGNOSIS — E785 Hyperlipidemia, unspecified: Secondary | ICD-10-CM | POA: Diagnosis not present

## 2012-05-24 DIAGNOSIS — E1149 Type 2 diabetes mellitus with other diabetic neurological complication: Secondary | ICD-10-CM | POA: Diagnosis not present

## 2012-05-24 NOTE — Progress Notes (Signed)
Subjective:    Patient ID: James Bell, male    DOB: November 30, 1930, 78 y.o.   MRN: [id number] This is a 78 year old right-handed male,  works as an Clinical research associate. Admitted March 11, 2012, with progressive  right-sided weakness. MRI revealed moderate-size acute infarct  affecting the left posterior putamen and left periventricular white  matter. MRA of the head was unremarkable. Carotid Dopplers with no ICA  stenosis. Echocardiogram with ejection fraction of 65% and grade 1  diastolic dysfunction. Neurology consulted, placed on Plavix therapy.  The patient was not a tPA candidate.   HPI Patient was [facility] on July 13. He went to [facility] and was discharged to [facility] on August 24. Since discharge from [facility] he has been receiving home health PT and OT. He is able to get in and out of a car and requests referral to outpatient PT and OT  Right hand pain and swelling with mild shoulder pain. No history of falls since discharge Pain Inventory Average Pain no pain Pain Right Now no pain My pain is soreness after therapy  In the last 24 hours, has pain interfered with the following? General activity 0 Relation with others 0 Enjoyment of life 0 What TIME of day is your pain at its worst? no pain Sleep (in general) Good  Pain is worse with: some activites Pain improves with: rest and therapy/exercise Relief from Meds: 10  Mobility walk with assistance use a walker ability to climb steps?  no do you drive?  no  Function disabled: date disabled 03/07/12 I need assistance with the following:  dressing, bathing, meal prep, household duties and shopping  Neuro/Psych numbness tingling  Prior Studies Any changes since last visit?  no  Physicians involved in your care Any changes since last visit?  no   Family History  Problem Relation Age of Onset  . Stroke Father   . Cancer Father    History   Social History  .  Marital Status: Unknown    Spouse Name: N/A    Number of Children: N/A  . Years of Education: N/A   Social History Main Topics  . Smoking status: Former Smoker -- 10 years    Types: Pipe    Quit date: 09/18/1976  . Smokeless tobacco: Never Used   Comment: "used to smoke pipe when I was younger"  . Alcohol Use: 2.4 oz/week    2 Shots of liquor, 2 Glasses of wine per week  . Drug Use: No  . Sexually Active: Yes   Other Topics Concern  . None   Social History Narrative  . None   Past Surgical History  Procedure Date  . Prostate surgery 2010    Laser  . Tonsillectomy and adenoidectomy ~ 1945  . Hemorrhoid surgery 1991  . Cataract extraction w/ intraocular lens implant 11//2012 - 08/2011  . Cardiac catheterization 2004    "normal"  . Inguinal hernia repair 1981; 2002    left; "repaired left"  . Skin cancer excision 2002    bridge of nose   Past Medical History  Diagnosis Date  . Hypercholesteremia   . Restless leg syndrome   . Sleep apnea   . Type II diabetes mellitus   . H/O hiatal hernia   . GERD (gastroesophageal reflux disease)   . Headache 03/11/12    "up until ~ 2 yr ago I had them daily"  . Stroke 03/07/12    slurred speech; right hemplegia  .  Basal cell carcinoma of nose 2002    "bridge of nose"  . History of kidney problems 12/01/10    "lacerated kidney"   BP 118/60  Resp 12  Ht 5\' 9"  (1.753 m)  Wt 176 lb 6.4 oz (80.015 kg)  BMI 26.05 kg/m2    Review of Systems  Respiratory: Positive for apnea.   Neurological: Positive for numbness.       Tingling  All other systems reviewed and are negative.       Objective:   Physical Exam  Constitutional: He is oriented to person, place, and time. He appears well-developed and well-nourished.  Musculoskeletal:       Right shoulder: He exhibits decreased range of motion, pain and decreased strength.       Right hand: He exhibits decreased range of motion, tenderness and swelling. Decreased strength noted.         Pain with MCP compression right hand Right shoulder pain with passive abduction and internal rotation  Neurological: He is alert and oriented to person, place, and time. He displays abnormal reflex. A sensory deficit is present. He exhibits abnormal muscle tone. Coordination abnormal.  Reflex Scores:      Tricep reflexes are 3+ on the right side and 2+ on the left side.      Bicep reflexes are 3+ on the right side and 2+ on the left side.      Brachioradialis reflexes are 3+ on the right side and 2+ on the left side.      Patellar reflexes are 3+ on the right side and 2+ on the left side.      Achilles reflexes are 3+ on the right side and 2+ on the left side.      Mild sensory deficit right hand light-touch Motor strength is 2 minus in the finger flexors biceps 3 minus in the knee extensor on the right side    Psychiatric: His speech is normal. Judgment and thought content normal. His affect is blunt. He is slowed. Cognition and memory are normal.          Assessment & Plan:  1. Left subcortical infarct with right hemiparesis, improving with further therapy. Now is at home and requiring outpatient therapy. 2. Right hand pain possible reflex sympathetic dystrophy this also may be causing some shoulder pain 3. Right shoulder impingement syndrome related to weakness and altered biomechanics  Referral to outpatient therapy Discussed Botox injection Discussed shoulder injection for impingement Return to clinic in 4 weeks

## 2012-05-24 NOTE — Patient Instructions (Addendum)
Complex Regional Pain Syndrome Complex Regional Pain Syndrome (CRPS) is a nerve disorder that occurs at the site of an injury. It occurs especially after injuries from high-velocity impacts such as those from bullets or shrapnel. However, it may occur without apparent injury. The arms or legs are usually involved. SYMPTOMS  CRPS is a chronic condition characterized by:  Severe burning pain.   Changes in bone and skin.   Excessive sweating.   Tissue swelling.   Extreme sensitivity to touch.  One visible sign of CRPS near the site of injury is warm, shiny, red skin that later becomes cool and bluish. The pain that patients report is out of proportion to the severity of the injury. The pain gets worse, rather than better, over time. Eventually the joints become stiff from disuse and the skin, muscles, and bone atrophy. The symptoms of CRPS vary in severity and duration. The cause of CRPS is unknown. The disorder is unique in that it simultaneously affects the:  Nerves.   Skin.   Muscles.   Blood vessels.   Bones.  CRPS can strike at any age but is more common between the ages of 40 and 60. However, the number of CRPS cases among adolescents and young adults is increasing. CRPS is diagnosed primarily through observation of the symptoms. Some physicians use thermography to detect changes in body temperature that are common in CRPS. X-rays may also show changes in the bone.  TREATMENT  Treatments include:  Physicians use a variety of drugs to treat CRPS.   Elevation of the extremity and physical therapy are also used to treat CRPS.   Injection of a local anesthetic.   Applying brief pulses of electricity to nerve endings under the skin (transcutaneous electrical stimulation or TENS).   In some cases, interruption of the affected portion of the sympathetic nervous system (surgical or chemical sympathectomy) is necessary to relieve pain. This involves cutting the nerve or nerves. Pain is  destroyed almost instantly, but this treatment may also destroy other sensations.  PROGNOSIS Good progress can be made in treating CRPS if treatment is begun early. Ideally treatment should begin within 3 months of the first symptoms. Early treatment often results in remission. If treatment is delayed, the disorder can quickly spread to the entire limb, and changes in bone and muscle may become irreversible. In 50 percent of CRPS cases, pain persists longer than 6 months and sometimes for years. Document Released: 08/25/2002 Document Revised: 08/24/2011 Document Reviewed: 07/15/2008 [employer] Patient Information 2012 [employer]. 

## 2012-05-28 ENCOUNTER — Encounter: Payer: Self-pay | Admitting: Physical Medicine & Rehabilitation

## 2012-05-28 DIAGNOSIS — I6350 Cerebral infarction due to unspecified occlusion or stenosis of unspecified cerebral artery: Secondary | ICD-10-CM | POA: Diagnosis not present

## 2012-05-28 DIAGNOSIS — Z5189 Encounter for other specified aftercare: Secondary | ICD-10-CM | POA: Diagnosis not present

## 2012-05-28 DIAGNOSIS — G8190 Hemiplegia, unspecified affecting unspecified side: Secondary | ICD-10-CM | POA: Diagnosis not present

## 2012-06-07 DIAGNOSIS — I6350 Cerebral infarction due to unspecified occlusion or stenosis of unspecified cerebral artery: Secondary | ICD-10-CM | POA: Diagnosis not present

## 2012-06-11 DIAGNOSIS — E119 Type 2 diabetes mellitus without complications: Secondary | ICD-10-CM | POA: Diagnosis not present

## 2012-06-11 DIAGNOSIS — G2581 Restless legs syndrome: Secondary | ICD-10-CM | POA: Diagnosis not present

## 2012-06-11 DIAGNOSIS — I10 Essential (primary) hypertension: Secondary | ICD-10-CM | POA: Diagnosis not present

## 2012-06-11 DIAGNOSIS — Z0271 Encounter for disability determination: Secondary | ICD-10-CM | POA: Diagnosis not present

## 2012-06-18 ENCOUNTER — Encounter: Payer: Self-pay | Admitting: Physical Medicine & Rehabilitation

## 2012-06-19 DIAGNOSIS — R279 Unspecified lack of coordination: Secondary | ICD-10-CM | POA: Diagnosis not present

## 2012-06-19 DIAGNOSIS — I69959 Hemiplegia and hemiparesis following unspecified cerebrovascular disease affecting unspecified side: Secondary | ICD-10-CM | POA: Diagnosis not present

## 2012-06-19 DIAGNOSIS — M6281 Muscle weakness (generalized): Secondary | ICD-10-CM | POA: Diagnosis not present

## 2012-06-19 DIAGNOSIS — IMO0001 Reserved for inherently not codable concepts without codable children: Secondary | ICD-10-CM | POA: Diagnosis not present

## 2012-06-19 DIAGNOSIS — R262 Difficulty in walking, not elsewhere classified: Secondary | ICD-10-CM | POA: Diagnosis not present

## 2012-06-19 DIAGNOSIS — M25519 Pain in unspecified shoulder: Secondary | ICD-10-CM | POA: Diagnosis not present

## 2012-06-20 DIAGNOSIS — I69959 Hemiplegia and hemiparesis following unspecified cerebrovascular disease affecting unspecified side: Secondary | ICD-10-CM | POA: Diagnosis not present

## 2012-06-20 DIAGNOSIS — IMO0001 Reserved for inherently not codable concepts without codable children: Secondary | ICD-10-CM | POA: Diagnosis not present

## 2012-06-20 DIAGNOSIS — R279 Unspecified lack of coordination: Secondary | ICD-10-CM | POA: Diagnosis not present

## 2012-06-20 DIAGNOSIS — M6281 Muscle weakness (generalized): Secondary | ICD-10-CM | POA: Diagnosis not present

## 2012-06-20 DIAGNOSIS — M25519 Pain in unspecified shoulder: Secondary | ICD-10-CM | POA: Diagnosis not present

## 2012-06-21 ENCOUNTER — Ambulatory Visit: Payer: Medicare Other | Admitting: Physical Medicine & Rehabilitation

## 2012-06-24 ENCOUNTER — Ambulatory Visit (HOSPITAL_BASED_OUTPATIENT_CLINIC_OR_DEPARTMENT_OTHER): Payer: Medicare Other | Admitting: Physical Medicine & Rehabilitation

## 2012-06-24 ENCOUNTER — Encounter: Payer: Self-pay | Admitting: Physical Medicine & Rehabilitation

## 2012-06-24 ENCOUNTER — Encounter: Payer: Medicare Other | Attending: Physical Medicine & Rehabilitation

## 2012-06-24 VITALS — BP 105/52 | HR 76 | Resp 16 | Ht 69.0 in | Wt 178.0 lb

## 2012-06-24 DIAGNOSIS — M79609 Pain in unspecified limb: Secondary | ICD-10-CM | POA: Insufficient documentation

## 2012-06-24 DIAGNOSIS — G8111 Spastic hemiplegia affecting right dominant side: Secondary | ICD-10-CM

## 2012-06-24 DIAGNOSIS — I6350 Cerebral infarction due to unspecified occlusion or stenosis of unspecified cerebral artery: Secondary | ICD-10-CM | POA: Diagnosis not present

## 2012-06-24 DIAGNOSIS — G8110 Spastic hemiplegia affecting unspecified side: Secondary | ICD-10-CM | POA: Diagnosis not present

## 2012-06-24 DIAGNOSIS — M25819 Other specified joint disorders, unspecified shoulder: Secondary | ICD-10-CM | POA: Insufficient documentation

## 2012-06-24 DIAGNOSIS — I639 Cerebral infarction, unspecified: Secondary | ICD-10-CM

## 2012-06-24 DIAGNOSIS — I69959 Hemiplegia and hemiparesis following unspecified cerebrovascular disease affecting unspecified side: Secondary | ICD-10-CM | POA: Diagnosis not present

## 2012-06-24 DIAGNOSIS — IMO0001 Reserved for inherently not codable concepts without codable children: Secondary | ICD-10-CM | POA: Diagnosis not present

## 2012-06-24 DIAGNOSIS — R279 Unspecified lack of coordination: Secondary | ICD-10-CM | POA: Diagnosis not present

## 2012-06-24 DIAGNOSIS — M6281 Muscle weakness (generalized): Secondary | ICD-10-CM | POA: Diagnosis not present

## 2012-06-24 DIAGNOSIS — M25519 Pain in unspecified shoulder: Secondary | ICD-10-CM | POA: Diagnosis not present

## 2012-06-24 NOTE — Progress Notes (Signed)
Subjective:    Patient ID: James Bell, male    DOB: August 22, 1931, 78 y.o.   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 MRI revealed moderate-size acute infarct  affecting the left posterior putamen and left periventricular white  matter. MRA of the head was unremarkable. Carotid Dopplers with no ICA  stenosis. Echocardiogram with ejection fraction of 65% and grade 1  diastolic dysfunction. Neurology consulted, placed on Plavix therapy.  The patient was not a tPA candidate.  He is receiving outpatient physical therapy and occupational therapy at [facility] Pain Inventory Average Pain 1 Pain Right Now 0 My pain is dull  In the last 24 hours, has pain interfered with the following? General activity 0 Relation with others 0 Enjoyment of life 0 What TIME of day is your pain at its worst? Depends Sleep (in general) Good  Pain is worse with: unsure Pain improves with: rest Relief from Meds: 0  Mobility walk with assistance use a walker do you drive?  no use a wheelchair  Function I need assistance with the following:  bathing, meal prep, household duties and shopping  Neuro/Psych weakness  Prior Studies Any changes since last visit?  no  Physicians involved in your care Any changes since last visit?  no   Family History  Problem Relation Age of Onset  . Stroke Father   . Cancer Father    History   Social History  . Marital Status: Unknown    Spouse Name: N/A    Number of Children: N/A  . Years of Education: N/A   Social History Main Topics  . Smoking status: Former Smoker -- 10 years    Types: Pipe    Quit date: 09/18/1976  . Smokeless tobacco: Never Used   Comment: "used to smoke pipe when I was younger"  . Alcohol Use: 2.4 oz/week    2 Shots of liquor, 2 Glasses of wine per week  . Drug Use: No  . Sexually Active: Yes    Other Topics Concern  . None   Social History Narrative  . None   Past Surgical History  Procedure Date  . Prostate surgery 2010    Laser  . Tonsillectomy and adenoidectomy ~ 1945  . Hemorrhoid surgery 1991  . Cataract extraction w/ intraocular lens implant 11//2012 - 08/2011  . Cardiac catheterization 2004    "normal"  . Inguinal hernia repair 1981; 2002    left; "repaired left"  . Skin cancer excision 2002    bridge of nose   Past Medical History  Diagnosis Date  . Hypercholesteremia   . Restless leg syndrome   . Sleep apnea   . Type II diabetes mellitus   . H/O hiatal hernia   . GERD (gastroesophageal reflux disease)   . Headache 03/11/12    "up until ~ 2 yr ago I had them daily"  . Stroke 03/07/12    slurred speech; right hemplegia  . Basal cell carcinoma of nose 2002    "bridge of nose"  . History of kidney problems 12/01/10    "lacerated kidney"   There were no vitals taken for this visit.      Review of Systems  HENT: Negative.   Eyes: Negative.   Respiratory: Positive for apnea and cough.   Cardiovascular: Negative.   Gastrointestinal: Negative.   Genitourinary: Negative.   Musculoskeletal: Negative.   Skin: Negative.  Neurological: Positive for weakness.  Hematological: Negative.   Psychiatric/Behavioral: Negative.        Objective:   Physical Exam  Constitutional: He is oriented to person, place, and time. He appears well-developed and well-nourished.  Musculoskeletal:  Right shoulder: He exhibits decreased range of motion, pain and decreased strength.  Right hand: He exhibits decreased range of motion, tenderness and swelling. Decreased strength noted.  Pain with MCP compression right hand Right shoulder pain with passive abduction and internal rotation  Neurological: He is alert and oriented to person, place, and time. He displays abnormal reflex. A sensory deficit is present. He exhibits abnormal muscle tone. Coordination abnormal.    Reflex Scores:  Tricep reflexes are 3+ on the right side and 2+ on the left side.  Bicep reflexes are 3+ on the right side and 2+ on the left side.  Brachioradialis reflexes are 3+ on the right side and 2+ on the left side.  Patellar reflexes are 3+ on the right side and 2+ on the left side.  Achilles reflexes are 3+ on the right side and 2+ on the left side. Mild sensory deficit right hand light-touch Motor strength is 2 minus in the finger flexors biceps,3-/5 R deltoid 3 minus in the knee extensor on the right side          Assessment & Plan:  1. Left subcortical infarct with right hemiparesis, improving with further therapy. Now is at home and requiring outpatient therapy. He needs a transport chair and I have written a prescription for this. 2. Right hand pain possible reflex sympathetic dystrophy this also may be causing some shoulder pain , improving.Still has dorsum of the hand swelling. Elevation and retrograde massage recommended 3. Right shoulder impingement syndrome related to weakness and altered biomechanics, improved RTC in one month

## 2012-06-24 NOTE — Patient Instructions (Addendum)
I have increased your physical and occupational therapy to 3 times per week I have written him a prescription for a transport chair.

## 2012-06-25 ENCOUNTER — Telehealth: Payer: Self-pay | Admitting: Physical Medicine & Rehabilitation

## 2012-06-25 ENCOUNTER — Ambulatory Visit (INDEPENDENT_AMBULATORY_CARE_PROVIDER_SITE_OTHER): Payer: Medicare Other | Admitting: Family Medicine

## 2012-06-25 ENCOUNTER — Encounter: Payer: Self-pay | Admitting: Family Medicine

## 2012-06-25 VITALS — BP 132/76 | HR 68 | Temp 97.6°F | Resp 20 | Ht 69.0 in | Wt 181.2 lb

## 2012-06-25 DIAGNOSIS — G2581 Restless legs syndrome: Secondary | ICD-10-CM

## 2012-06-25 DIAGNOSIS — I639 Cerebral infarction, unspecified: Secondary | ICD-10-CM

## 2012-06-25 DIAGNOSIS — E785 Hyperlipidemia, unspecified: Secondary | ICD-10-CM

## 2012-06-25 DIAGNOSIS — I6350 Cerebral infarction due to unspecified occlusion or stenosis of unspecified cerebral artery: Secondary | ICD-10-CM | POA: Diagnosis not present

## 2012-06-25 DIAGNOSIS — R002 Palpitations: Secondary | ICD-10-CM | POA: Diagnosis not present

## 2012-06-25 DIAGNOSIS — K219 Gastro-esophageal reflux disease without esophagitis: Secondary | ICD-10-CM

## 2012-06-25 DIAGNOSIS — R799 Abnormal finding of blood chemistry, unspecified: Secondary | ICD-10-CM

## 2012-06-25 DIAGNOSIS — E119 Type 2 diabetes mellitus without complications: Secondary | ICD-10-CM

## 2012-06-25 DIAGNOSIS — G8191 Hemiplegia, unspecified affecting right dominant side: Secondary | ICD-10-CM

## 2012-06-25 DIAGNOSIS — Z23 Encounter for immunization: Secondary | ICD-10-CM | POA: Diagnosis not present

## 2012-06-25 DIAGNOSIS — R7989 Other specified abnormal findings of blood chemistry: Secondary | ICD-10-CM

## 2012-06-25 DIAGNOSIS — G8190 Hemiplegia, unspecified affecting unspecified side: Secondary | ICD-10-CM

## 2012-06-25 DIAGNOSIS — N400 Enlarged prostate without lower urinary tract symptoms: Secondary | ICD-10-CM

## 2012-06-25 DIAGNOSIS — G43909 Migraine, unspecified, not intractable, without status migrainosus: Secondary | ICD-10-CM

## 2012-06-25 LAB — LIPID PANEL
Cholesterol: 79 mg/dL (ref 0–200)
HDL: 19.3 mg/dL — ABNORMAL LOW (ref >39.00)
LDL Cholesterol: 39 mg/dL (ref 0–99)
Total CHOL/HDL Ratio: 4
Triglycerides: 106 mg/dL (ref 0.0–149.0)
VLDL: 21.2 mg/dL (ref 0.0–40.0)

## 2012-06-25 LAB — BASIC METABOLIC PANEL
BUN: 31 mg/dL — ABNORMAL HIGH (ref 6–23)
CO2: 24 mEq/L (ref 19–32)
Calcium: 8.9 mg/dL (ref 8.4–10.5)
Chloride: 105 mEq/L (ref 96–112)
Creatinine, Ser: 1.3 mg/dL (ref 0.4–1.5)
GFR: 58.68 mL/min — ABNORMAL LOW (ref >60.00)
Glucose, Bld: 67 mg/dL — ABNORMAL LOW (ref 70–99)
Potassium: 4 mEq/L (ref 3.5–5.1)
Sodium: 137 mEq/L (ref 135–145)

## 2012-06-25 LAB — TSH: TSH: 2.07 u[IU]/mL (ref 0.35–5.50)

## 2012-06-25 LAB — HEMOGLOBIN A1C: Hgb A1c MFr Bld: 6.4 % (ref 4.6–6.5)

## 2012-06-25 NOTE — Telephone Encounter (Signed)
Documentation has been faxed. 

## 2012-06-25 NOTE — Telephone Encounter (Signed)
Needs OV notes stating why patient needs wheelchair

## 2012-06-25 NOTE — Patient Instructions (Addendum)
Lets try propranolol 20mg  at bedtime for 1-2 weeks to see if improvement in rapid heart rate. EKG today. Flu shot today. Blood work today. Good to see you today, call us with questions. Return at your convenience for medicare wellness visit.

## 2012-06-25 NOTE — Progress Notes (Addendum)
Subjective:    Patient ID: James Bell, male    DOB: 06/01/1931, 78 y.o.   MRN: [id number]  HPI CC: new pt to establish  Prior practicing ophthalmologist.  Presents with friend and caregiver.  Has requested records from prior PCP, Dr. Shelva Majestic in [place]  Recent CVA - in rehab at [facility], Dr. Bryson Dames is PM&R doc.  03/07/2012 TIA.  03/11/2012 progressive R sided weakness, found to have R subcortical infarct with residual right hemiparesis as well as right facial droop.  Some trouble swallowing but not on special diet.  ?if cough stemming from this.  Was working prior to this.  Was on aspirin prior to stroke.  This was changed to plavix after stroke.   Neuro - Dr. Pearlean Brownie MRI - moderate-size acute infarct affecting the left posterior putamen and left periventricular white matter. MRA - unremarkable. Carotid Dopplers - no ICA stenosis. Echocardiogram - EF of 65% and grade 1 diastolic dysfunction  Palpitations - waking up early AM with rapid heart rate - up to105.  Takes propranolol prn this.  Denies palpitations, HA, chest pain/tightness/SOB.  Occasionally feels dizzy when getting up too quickly.  Heart rate runs 70-80s during day.  Last EKG was   On cymbalta for possible mild depression per prior PCP.  DM - last A1c 6.4%.  Followed by endo - Dr. Sharl Ma.  Would like to have this followed by [name].  Caffeine: 1 cup coffee/day Occupation: ophthalmologist Edu: MD Activity: rehab s/p CVA Diet: some water, fruits/vegetables daily  Preventative: Last CPE was in hospital Colonoscopy - 05/2011 - normal per pt (Dr. Pearlean Brownie, [place], [place]) Flu shot - would like today Pneumovax - 09/2007 Shingles shot - 09/2007 Tetanus - 05/2005  Medications and allergies reviewed and updated in chart.  Past histories reviewed and updated if relevant as below. Patient Active Problem List  Diagnosis  . CVA (cerebrovascular accident)  . Right hemiparesis  . DM type 2 (diabetes mellitus, type 2)  . OSA (obstructive sleep  apnea)  . Elevated serum creatinine  . CVA (cerebral vascular accident)   Past Medical History  Diagnosis Date  . HLD (hyperlipidemia)   . Restless leg syndrome   . OSA on CPAP     CPAP at 12  . Type II diabetes mellitus 2011  . H/O hiatal hernia   . GERD (gastroesophageal reflux disease)   . Migraines 03/11/12    now rare  . CVA (cerebral infarction) 03/07/12    slurred speech; right hemplegia  . Basal cell carcinoma of nose 2002    bridge of nose  . History of kidney problems 4/12    lacerated kidney after fall  . BPH (benign prostatic hypertrophy)    Past Surgical History  Procedure Date  . Prostate surgery 2010    Laser  . Tonsillectomy and adenoidectomy ~ 1945  . Hemorrhoid surgery 1991  . Cataract extraction w/ intraocular lens implant 11//2012 - 08/2011  . Cardiac catheterization 2004    normal  . Inguinal hernia repair 1981; 2002    left; "repaired left"  . Skin cancer excision 2002    bridge of nose   History  Substance Use Topics  . Smoking status: Never Smoker   . Smokeless tobacco: Never Used   Comment: "used to smoke pipe when I was younger"  . Alcohol Use: 2.4 oz/week    2 Shots of liquor, 2 Glasses of wine per week     occasional   Family History  Problem Relation Age  of Onset  . Stroke Father   . Cancer Father 61    lung, smoker  . Stroke Brother   . Diabetes Brother 29  . Coronary artery disease Neg Hx    No Known Allergies Current Outpatient Prescriptions on File Prior to Visit  Medication Sig Dispense Refill  . calcium carbonate (OS-CAL) 600 MG TABS Take 600 mg by mouth daily.       . clopidogrel (PLAVIX) 75 MG tablet Take 75 mg by mouth daily.      Marland Kitchen dutasteride (AVODART) 0.5 MG capsule Take 0.5 mg by mouth daily.      . fish oil-omega-3 fatty acids 1000 MG capsule Take 2 g by mouth 2 (two) times daily.      . Folic Acid-Vit B6-Vit B12 (FOLBEE) 2.5-25-1 MG TABS Take 1 tablet by mouth daily.      . insulin glargine (LANTUS) 100 UNIT/ML  injection Inject 50 Units into the skin at bedtime.       Marland Kitchen lisinopril (PRINIVIL,ZESTRIL) 10 MG tablet Take 10 mg by mouth daily.      Marland Kitchen omeprazole (PRILOSEC) 20 MG capsule Take 20 mg by mouth daily.      . pramipexole (MIRAPEX) 1 MG tablet Take 1 mg by mouth at bedtime.       . propranolol (INDERAL) 20 MG tablet Take 20 mg by mouth as needed.      . rosuvastatin (CRESTOR) 20 MG tablet Take 20 mg by mouth daily.      . vitamin C (ASCORBIC ACID) 500 MG tablet Take 1,000 mg by mouth daily.       Marland Kitchen aspirin EC 81 MG tablet Take 81 mg by mouth daily.      . cetirizine (ZYRTEC) 10 MG tablet Take 10 mg by mouth daily as needed.      . fluticasone (FLONASE) 50 MCG/ACT nasal spray Place 2 sprays into the nose daily as needed.      . zolmitriptan (ZOMIG) 5 MG tablet Take 5 mg by mouth as needed.         Review of Systems  Constitutional: Negative for fever, chills, activity change, appetite change, fatigue and unexpected weight change.  HENT: Negative for hearing loss and neck pain.   Eyes: Negative for visual disturbance.  Respiratory: Negative for cough, chest tightness, shortness of breath and wheezing.   Cardiovascular: Positive for palpitations (at night time). Negative for chest pain and leg swelling.  Gastrointestinal: Negative for nausea, vomiting, abdominal pain, diarrhea, constipation, blood in stool and abdominal distention.  Genitourinary: Negative for hematuria and difficulty urinating.  Musculoskeletal: Negative for myalgias and arthralgias.  Skin: Negative for rash.  Neurological: Positive for weakness. Negative for dizziness, seizures, syncope and headaches.  Hematological: Does not bruise/bleed easily.  Psychiatric/Behavioral: Negative for dysphoric mood. The patient is not nervous/anxious.        Objective:   Physical Exam  Nursing note and vitals reviewed. Constitutional: He is oriented to person, place, and time. He appears well-developed and well-nourished. No distress.    HENT:  Head: Normocephalic and atraumatic.  Right Ear: Hearing, tympanic membrane, external ear and ear canal normal.  Left Ear: Hearing, tympanic membrane, external ear and ear canal normal.  Nose: Nose normal.  Mouth/Throat: Oropharynx is clear and moist. No oropharyngeal exudate.  Eyes: Conjunctivae normal and EOM are normal. Pupils are equal, round, and reactive to light. No scleral icterus.  Neck: Normal range of motion. Neck supple. Carotid bruit is not present.  Cardiovascular: Normal  rate, regular rhythm, normal heart sounds and intact distal pulses.   No murmur heard. Pulses:      Radial pulses are 2+ on the right side, and 2+ on the left side.  Pulmonary/Chest: Effort normal and breath sounds normal. No respiratory distress. He has no wheezes. He has no rales.  Abdominal: Soft. Bowel sounds are normal. He exhibits no distension and no mass. There is no tenderness. There is no rebound and no guarding.  Musculoskeletal: Normal range of motion. He exhibits no edema.  Lymphadenopathy:    He has no cervical adenopathy.  Neurological: He is alert and oriented to person, place, and time. No sensory deficit.       Sitting in wheelchair. Evident R hemiparesis RUE: moderately diminisied (3/5) strength LUE: 5/5 strength ZOX:WRUEAV diminished LLE: 5/5 strength   Skin: Skin is warm and dry. No rash noted.  Psychiatric: He has a normal mood and affect. His behavior is normal. Judgment and thought content normal.       Assessment & Plan:

## 2012-06-26 DIAGNOSIS — M6281 Muscle weakness (generalized): Secondary | ICD-10-CM | POA: Diagnosis not present

## 2012-06-26 DIAGNOSIS — E785 Hyperlipidemia, unspecified: Secondary | ICD-10-CM | POA: Insufficient documentation

## 2012-06-26 DIAGNOSIS — M25519 Pain in unspecified shoulder: Secondary | ICD-10-CM | POA: Diagnosis not present

## 2012-06-26 DIAGNOSIS — I69959 Hemiplegia and hemiparesis following unspecified cerebrovascular disease affecting unspecified side: Secondary | ICD-10-CM | POA: Diagnosis not present

## 2012-06-26 DIAGNOSIS — K219 Gastro-esophageal reflux disease without esophagitis: Secondary | ICD-10-CM | POA: Insufficient documentation

## 2012-06-26 DIAGNOSIS — R279 Unspecified lack of coordination: Secondary | ICD-10-CM | POA: Diagnosis not present

## 2012-06-26 DIAGNOSIS — N138 Other obstructive and reflux uropathy: Secondary | ICD-10-CM | POA: Insufficient documentation

## 2012-06-26 DIAGNOSIS — IMO0001 Reserved for inherently not codable concepts without codable children: Secondary | ICD-10-CM | POA: Diagnosis not present

## 2012-06-26 DIAGNOSIS — E1169 Type 2 diabetes mellitus with other specified complication: Secondary | ICD-10-CM | POA: Insufficient documentation

## 2012-06-26 DIAGNOSIS — R002 Palpitations: Secondary | ICD-10-CM | POA: Insufficient documentation

## 2012-06-26 DIAGNOSIS — N401 Enlarged prostate with lower urinary tract symptoms: Secondary | ICD-10-CM | POA: Insufficient documentation

## 2012-06-26 DIAGNOSIS — G2581 Restless legs syndrome: Secondary | ICD-10-CM | POA: Insufficient documentation

## 2012-06-26 NOTE — Assessment & Plan Note (Signed)
Mild tachycardia at night time only. Currently using propranolol on prn basis, but endorses sxs 2-3 x/wks despite this.  Recommended try propranolol 25mg  qhs, trial of this for 1-2 wks.  If not helping, rec back down to prn dosing.  EKG - normal sinus at rate of 70, LAD, normal intervals, no acute ST/T changes.

## 2012-06-26 NOTE — Assessment & Plan Note (Signed)
This dx in chart from prior - check Cr today.

## 2012-06-26 NOTE — Assessment & Plan Note (Signed)
Residual  R hemiparesis s/p CVA.  Following with PT 3x/wk and sees Dr. Wynn Banker PM&R.

## 2012-06-26 NOTE — Assessment & Plan Note (Signed)
Thought due to small vessel disease - will recommend continue plavix, doubt addition of aspirin to provide significant benefit. Will continue tight control of other modifiable risk factors. BP: 132/76 mmHg (reg lt arm)

## 2012-06-26 NOTE — Assessment & Plan Note (Signed)
continue ppi ?

## 2012-06-26 NOTE — Assessment & Plan Note (Signed)
Continue 5a reductase inhibitor

## 2012-06-26 NOTE — Assessment & Plan Note (Addendum)
Good control on current regimen. Continue lantus 50u Qpm and janumet xr bid. Pt desires to have dm followed here. Check A1 today.

## 2012-06-26 NOTE — Assessment & Plan Note (Signed)
On fish oil 2gm bid and crestor 20mg  daily. Goal LDL <70 given CVA

## 2012-06-27 ENCOUNTER — Encounter: Payer: Self-pay | Admitting: Family Medicine

## 2012-06-27 ENCOUNTER — Other Ambulatory Visit: Payer: Self-pay | Admitting: Family Medicine

## 2012-06-27 DIAGNOSIS — F331 Major depressive disorder, recurrent, moderate: Secondary | ICD-10-CM | POA: Insufficient documentation

## 2012-06-27 DIAGNOSIS — M6281 Muscle weakness (generalized): Secondary | ICD-10-CM | POA: Diagnosis not present

## 2012-06-27 DIAGNOSIS — I69959 Hemiplegia and hemiparesis following unspecified cerebrovascular disease affecting unspecified side: Secondary | ICD-10-CM | POA: Diagnosis not present

## 2012-06-27 DIAGNOSIS — IMO0001 Reserved for inherently not codable concepts without codable children: Secondary | ICD-10-CM | POA: Diagnosis not present

## 2012-06-27 DIAGNOSIS — M25519 Pain in unspecified shoulder: Secondary | ICD-10-CM | POA: Diagnosis not present

## 2012-06-27 DIAGNOSIS — R279 Unspecified lack of coordination: Secondary | ICD-10-CM | POA: Diagnosis not present

## 2012-06-27 MED ORDER — ROSUVASTATIN CALCIUM 10 MG PO TABS: 10.0000 mg | ORAL_TABLET | Freq: Every day | ORAL | Status: DC

## 2012-06-28 DIAGNOSIS — M25519 Pain in unspecified shoulder: Secondary | ICD-10-CM | POA: Diagnosis not present

## 2012-06-28 DIAGNOSIS — IMO0001 Reserved for inherently not codable concepts without codable children: Secondary | ICD-10-CM | POA: Diagnosis not present

## 2012-06-28 DIAGNOSIS — R279 Unspecified lack of coordination: Secondary | ICD-10-CM | POA: Diagnosis not present

## 2012-06-28 DIAGNOSIS — M6281 Muscle weakness (generalized): Secondary | ICD-10-CM | POA: Diagnosis not present

## 2012-06-28 DIAGNOSIS — I69959 Hemiplegia and hemiparesis following unspecified cerebrovascular disease affecting unspecified side: Secondary | ICD-10-CM | POA: Diagnosis not present

## 2012-07-01 DIAGNOSIS — R279 Unspecified lack of coordination: Secondary | ICD-10-CM | POA: Diagnosis not present

## 2012-07-01 DIAGNOSIS — M6281 Muscle weakness (generalized): Secondary | ICD-10-CM | POA: Diagnosis not present

## 2012-07-01 DIAGNOSIS — IMO0001 Reserved for inherently not codable concepts without codable children: Secondary | ICD-10-CM | POA: Diagnosis not present

## 2012-07-01 DIAGNOSIS — M25519 Pain in unspecified shoulder: Secondary | ICD-10-CM | POA: Diagnosis not present

## 2012-07-01 DIAGNOSIS — I69959 Hemiplegia and hemiparesis following unspecified cerebrovascular disease affecting unspecified side: Secondary | ICD-10-CM | POA: Diagnosis not present

## 2012-07-02 DIAGNOSIS — I69959 Hemiplegia and hemiparesis following unspecified cerebrovascular disease affecting unspecified side: Secondary | ICD-10-CM | POA: Diagnosis not present

## 2012-07-02 DIAGNOSIS — M25519 Pain in unspecified shoulder: Secondary | ICD-10-CM | POA: Diagnosis not present

## 2012-07-02 DIAGNOSIS — IMO0001 Reserved for inherently not codable concepts without codable children: Secondary | ICD-10-CM | POA: Diagnosis not present

## 2012-07-02 DIAGNOSIS — R279 Unspecified lack of coordination: Secondary | ICD-10-CM | POA: Diagnosis not present

## 2012-07-02 DIAGNOSIS — M6281 Muscle weakness (generalized): Secondary | ICD-10-CM | POA: Diagnosis not present

## 2012-07-04 DIAGNOSIS — IMO0001 Reserved for inherently not codable concepts without codable children: Secondary | ICD-10-CM | POA: Diagnosis not present

## 2012-07-04 DIAGNOSIS — M6281 Muscle weakness (generalized): Secondary | ICD-10-CM | POA: Diagnosis not present

## 2012-07-04 DIAGNOSIS — M25519 Pain in unspecified shoulder: Secondary | ICD-10-CM | POA: Diagnosis not present

## 2012-07-04 DIAGNOSIS — R279 Unspecified lack of coordination: Secondary | ICD-10-CM | POA: Diagnosis not present

## 2012-07-04 DIAGNOSIS — I69959 Hemiplegia and hemiparesis following unspecified cerebrovascular disease affecting unspecified side: Secondary | ICD-10-CM | POA: Diagnosis not present

## 2012-07-05 ENCOUNTER — Ambulatory Visit (INDEPENDENT_AMBULATORY_CARE_PROVIDER_SITE_OTHER): Payer: Medicare Other | Admitting: Family Medicine

## 2012-07-05 ENCOUNTER — Encounter: Payer: Self-pay | Admitting: Family Medicine

## 2012-07-05 VITALS — BP 136/66 | HR 72 | Temp 97.8°F | Ht 69.0 in | Wt 184.5 lb

## 2012-07-05 DIAGNOSIS — N400 Enlarged prostate without lower urinary tract symptoms: Secondary | ICD-10-CM

## 2012-07-05 DIAGNOSIS — E119 Type 2 diabetes mellitus without complications: Secondary | ICD-10-CM

## 2012-07-05 DIAGNOSIS — R7989 Other specified abnormal findings of blood chemistry: Secondary | ICD-10-CM

## 2012-07-05 DIAGNOSIS — G8191 Hemiplegia, unspecified affecting right dominant side: Secondary | ICD-10-CM

## 2012-07-05 DIAGNOSIS — Z0000 Encounter for general adult medical examination without abnormal findings: Secondary | ICD-10-CM

## 2012-07-05 DIAGNOSIS — R002 Palpitations: Secondary | ICD-10-CM

## 2012-07-05 DIAGNOSIS — E785 Hyperlipidemia, unspecified: Secondary | ICD-10-CM

## 2012-07-05 DIAGNOSIS — G2581 Restless legs syndrome: Secondary | ICD-10-CM

## 2012-07-05 LAB — POCT URINALYSIS DIPSTICK
Bilirubin, UA: NEGATIVE
Blood, UA: NEGATIVE
Glucose, UA: NEGATIVE
Ketones, UA: NEGATIVE
Leukocytes, UA: NEGATIVE
Nitrite, UA: NEGATIVE
Protein, UA: NEGATIVE
Spec Grav, UA: 1.01
Urobilinogen, UA: 0.2
pH, UA: 6.5

## 2012-07-05 LAB — MICROALBUMIN / CREATININE URINE RATIO
Creatinine,U: 45.2 mg/dL
Microalb Creat Ratio: 4.2 mg/g (ref 0.0–30.0)
Microalb, Ur: 1.9 mg/dL (ref 0.0–1.9)

## 2012-07-05 MED ORDER — TAMSULOSIN HCL 0.4 MG PO CAPS: 0.4000 mg | ORAL_CAPSULE | Freq: Every day | ORAL | Status: DC

## 2012-07-05 NOTE — Patient Instructions (Addendum)
Good to see you today. Urine checked today. May try flomax at 0.4mg  daily in the morning.  If urinary symptoms of increased frequency and nocturia continued despite this, let me know for referral to urologist. I will see if we have copy of colonoscopy in your records from Dr. Shelva Majestic.

## 2012-07-05 NOTE — Progress Notes (Signed)
Subjective:    Patient ID: James Bell, male    DOB: April 05, 1931, 78 y.o.   MRN: [id number]  HPI CC: medicare wellness visit  Prior practicing ophthalmologist. Presents with friend and caregiver. Received records from prior PCP, Dr. Shelva Majestic in [place].  H/o BPH - On avodart 0.5mg  daily.  Noticing increasing frequency.  Nocturia was once per night.  Now up to 3 times.  No dysuria, urgency, fevers/chills, abd pain, flank pain.  Last urology appt with [facility] was 1 yr ago, by urologist.  Night time palpitations - propranolol working well.  DM - sugars averaging 100-120.   Lab Results  Component Value Date   HGBA1C 6.4 06/25/2012    Wt Readings from Last 3 Encounters:  07/05/12 184 lb 8 oz (83.689 kg)  06/25/12 181 lb 4 oz (82.214 kg)  06/24/12 178 lb (80.74 kg)    Preventative:  Colonoscopy - 05/2011 - normal per pt (Dr. Pearlean Brownie, [place], [place])  No record of colonoscopy received. Flu shot - 06/2012 Pneumovax - 09/2007  Shingles shot - 09/2007  Tetanus - 05/2005 Advanced directives:  Has advanced directives at home.  Will bring copy for chart.  No falls but is at increased fall risk.  No anhedonia. Passes vision screen.  wears hearing aides. Some trouble with IADLs 2/2 stroke.  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  Past histories reviewed and updated if relevant as below. Patient Active Problem List  Diagnosis  . CVA (cerebrovascular accident)  . Right hemiparesis  . DM type 2 (diabetes mellitus, type 2)  . OSA (obstructive sleep apnea)  . Elevated serum creatinine  . HLD (hyperlipidemia)  . Restless leg syndrome  . GERD (gastroesophageal reflux disease)  . Migraines  . BPH (benign prostatic hypertrophy)  . Palpitations  . Depression   Past Medical History  Diagnosis Date  . HLD (hyperlipidemia)   . Restless leg  syndrome   . OSA on CPAP     CPAP at 12  . Type II diabetes mellitus 2011  . H/O hiatal hernia   . GERD (gastroesophageal reflux disease)   . Migraines 03/11/12    now rare  . CVA (cerebral infarction) 03/07/12    slurred speech; right hemplegia  . Basal cell carcinoma of nose 2002    bridge of nose  . History of kidney problems 12/2010    lacerated kidney after fall  . BPH (benign prostatic hypertrophy)   . Depression     mild, on cymbalta per prior PCP   Past Surgical History  Procedure Date  . Prostate surgery 2010    Laser  . Tonsillectomy and adenoidectomy ~ 1945  . Hemorrhoid surgery 1991  . Cataract extraction w/ intraocular lens implant 11//2012 - 08/2011  . Cardiac catheterization 2004    normal  . Inguinal hernia repair 1981; 2002    left, then repaired  . Skin cancer excision 2002    bridge of nose  . Colonoscopy 05/2011    normal per pt (Dr. Pearlean Brownie, [place], [place])   History  Substance Use Topics  . Smoking status: Never Smoker   . Smokeless tobacco: Never Used   Comment: "used to smoke pipe when I was younger"  . Alcohol Use: 2.4 oz/week    2 Shots of liquor, 2 Glasses of wine per week     occasional   Family History  Problem  Relation Age of Onset  . Stroke Father   . Cancer Father 2    lung, smoker  . Stroke Brother   . Diabetes Brother 31  . Coronary artery disease Neg Hx    No Known Allergies Current Outpatient Prescriptions on File Prior to Visit  Medication Sig Dispense Refill  . cetirizine (ZYRTEC) 10 MG tablet Take 10 mg by mouth daily as needed.      . clopidogrel (PLAVIX) 75 MG tablet Take 75 mg by mouth daily.      . Coenzyme Q10 (COQ10) 150 MG CAPS Take 1 capsule by mouth 2 (two) times daily.      . DULoxetine (CYMBALTA) 60 MG capsule Take 60 mg by mouth daily.      Marland Kitchen dutasteride (AVODART) 0.5 MG capsule Take 0.5 mg by mouth daily.      . fish oil-omega-3 fatty acids 1000 MG capsule Take 2 g by mouth 2 (two) times daily.      .  fluticasone (FLONASE) 50 MCG/ACT nasal spray Place 2 sprays into the nose daily as needed.      . Folic Acid-Vit B6-Vit B12 (FOLBEE) 2.5-25-1 MG TABS Take 1 tablet by mouth daily.      . insulin glargine (LANTUS) 100 UNIT/ML injection Inject 50 Units into the skin at bedtime.       Marland Kitchen lisinopril (PRINIVIL,ZESTRIL) 10 MG tablet Take 10 mg by mouth daily.      Marland Kitchen omeprazole (PRILOSEC) 20 MG capsule Take 20 mg by mouth daily.      . pramipexole (MIRAPEX) 1 MG tablet Take 1 mg by mouth at bedtime.       . propranolol (INDERAL) 20 MG tablet Take 20 mg by mouth at bedtime.       . rosuvastatin (CRESTOR) 10 MG tablet Take 1 tablet (10 mg total) by mouth daily.  30 tablet  11  . SitaGLIPtin-MetFORMIN HCl (JANUMET XR) 50-1000 MG TB24 Take 1 tablet by mouth 2 (two) times daily before a meal.      . vitamin C (ASCORBIC ACID) 500 MG tablet Take 1,000 mg by mouth daily.       Marland Kitchen zolmitriptan (ZOMIG) 5 MG tablet Take 5 mg by mouth as needed.         Review of Systems Per HPI    Objective:   Physical Exam  Nursing note and vitals reviewed. Constitutional: He is oriented to person, place, and time. He appears well-developed and well-nourished. No distress.  HENT:  Head: Normocephalic and atraumatic.  Right Ear: External ear normal.  Left Ear: External ear normal.  Nose: Nose normal.  Mouth/Throat: Oropharynx is clear and moist.       Hearing aides in place  Eyes: Conjunctivae normal and EOM are normal. Pupils are equal, round, and reactive to light.  Neck: Normal range of motion. Neck supple. No thyromegaly present.  Cardiovascular: Normal rate, regular rhythm, normal heart sounds and intact distal pulses.   No murmur heard. Pulses:      Radial pulses are 2+ on the right side, and 2+ on the left side.  Pulmonary/Chest: Effort normal and breath sounds normal. No respiratory distress. He has no wheezes. He has no rales.  Abdominal: Soft. Bowel sounds are normal. He exhibits no distension and no mass.  There is no tenderness. There is no rebound and no guarding.  Musculoskeletal: Normal range of motion.  Lymphadenopathy:    He has no cervical adenopathy.  Neurological: He is alert and oriented to  person, place, and time.       Residual R sided weakness  Skin: Skin is warm and dry. No rash noted.  Psychiatric: He has a normal mood and affect. His behavior is normal. Judgment and thought content normal.       Assessment & Plan:

## 2012-07-07 ENCOUNTER — Encounter: Payer: Self-pay | Admitting: Family Medicine

## 2012-07-07 DIAGNOSIS — Z0000 Encounter for general adult medical examination without abnormal findings: Secondary | ICD-10-CM | POA: Insufficient documentation

## 2012-07-07 NOTE — Assessment & Plan Note (Signed)
continues working with PT - 3x/wk currently.

## 2012-07-07 NOTE — Assessment & Plan Note (Signed)
Discussed.  Monitor Cr.

## 2012-07-07 NOTE — Assessment & Plan Note (Addendum)
I have personally reviewed the Medicare Annual Wellness questionnaire and have noted 1. The patient's medical and social history 2. Their use of alcohol, tobacco or illicit drugs 3. Their current medications and supplements 4. The patient's functional ability including ADL's, fall risks, home safety risks and hearing or visual impairment. 5. Diet and physical activity 6. Evidence for depression or mood disorders The patients weight, height, BMI have been recorded in the chart.  Hearing and vision has been addressed. I have made referrals, counseling and provided education to the patient based review of the above and I have provided the pt with a written personalized care plan for preventive services. See scanned questionairre. Advanced directives discussed: has at home.  I asked him to bring me a copy.  Reviewed preventative protocols and updated unless pt declined. Overall memory testing intact, does have some trouble with concentration with serial 7s (3/5).

## 2012-07-07 NOTE — Assessment & Plan Note (Addendum)
chronic, good control as evidenced by latest A1c. Recommended he schedule eye exam as due. Will need foot exam next visit.

## 2012-07-07 NOTE — Assessment & Plan Note (Addendum)
Increase in symptoms - mainly frequency and nocturia. Checked UA to r/o infection, glucosuria - WNL. Add flomax.  If sxs not improved with this, will refer to urologist for further evaluation. S/p PVP for BPH

## 2012-07-07 NOTE — Assessment & Plan Note (Signed)
Improved with QHS propranolol. discussed monitoring for orthostatic sxs.

## 2012-07-07 NOTE — Assessment & Plan Note (Signed)
Reviewed #s, LDL was 39.  Recommended decrease crestor dose to 10mg  daily.  Continue fish oil 2gm bid. Lab Results  Component Value Date   CHOL 79 06/25/2012   HDL 19.30* 06/25/2012   LDLCALC 39 06/25/2012   TRIG 106.0 06/25/2012   CHOLHDL 4 06/25/2012

## 2012-07-07 NOTE — Assessment & Plan Note (Signed)
Controlled on mirapex.   

## 2012-07-08 DIAGNOSIS — IMO0001 Reserved for inherently not codable concepts without codable children: Secondary | ICD-10-CM | POA: Diagnosis not present

## 2012-07-08 DIAGNOSIS — R279 Unspecified lack of coordination: Secondary | ICD-10-CM | POA: Diagnosis not present

## 2012-07-08 DIAGNOSIS — M25519 Pain in unspecified shoulder: Secondary | ICD-10-CM | POA: Diagnosis not present

## 2012-07-08 DIAGNOSIS — I69959 Hemiplegia and hemiparesis following unspecified cerebrovascular disease affecting unspecified side: Secondary | ICD-10-CM | POA: Diagnosis not present

## 2012-07-08 DIAGNOSIS — M6281 Muscle weakness (generalized): Secondary | ICD-10-CM | POA: Diagnosis not present

## 2012-07-09 DIAGNOSIS — R279 Unspecified lack of coordination: Secondary | ICD-10-CM | POA: Diagnosis not present

## 2012-07-09 DIAGNOSIS — I69959 Hemiplegia and hemiparesis following unspecified cerebrovascular disease affecting unspecified side: Secondary | ICD-10-CM | POA: Diagnosis not present

## 2012-07-09 DIAGNOSIS — M25519 Pain in unspecified shoulder: Secondary | ICD-10-CM | POA: Diagnosis not present

## 2012-07-09 DIAGNOSIS — IMO0001 Reserved for inherently not codable concepts without codable children: Secondary | ICD-10-CM | POA: Diagnosis not present

## 2012-07-09 DIAGNOSIS — M6281 Muscle weakness (generalized): Secondary | ICD-10-CM | POA: Diagnosis not present

## 2012-07-10 ENCOUNTER — Other Ambulatory Visit: Payer: Self-pay | Admitting: Family Medicine

## 2012-07-10 NOTE — Telephone Encounter (Signed)
Received requests for alfuzosin from pharmacy - which is similar to flomax that I started last week.  Both for BPH. plz verify with patient - is he on alfuzosin (uroxatral) or tamsulosin (flomax), then remove one he is not taking from med list.  Thanks.

## 2012-07-10 NOTE — Telephone Encounter (Signed)
Confirmed with patient that he would be using the flomax. Uroxatral removed from med list.

## 2012-07-11 DIAGNOSIS — R279 Unspecified lack of coordination: Secondary | ICD-10-CM | POA: Diagnosis not present

## 2012-07-11 DIAGNOSIS — IMO0001 Reserved for inherently not codable concepts without codable children: Secondary | ICD-10-CM | POA: Diagnosis not present

## 2012-07-11 DIAGNOSIS — I69959 Hemiplegia and hemiparesis following unspecified cerebrovascular disease affecting unspecified side: Secondary | ICD-10-CM | POA: Diagnosis not present

## 2012-07-11 DIAGNOSIS — M25519 Pain in unspecified shoulder: Secondary | ICD-10-CM | POA: Diagnosis not present

## 2012-07-11 DIAGNOSIS — M6281 Muscle weakness (generalized): Secondary | ICD-10-CM | POA: Diagnosis not present

## 2012-07-16 DIAGNOSIS — R279 Unspecified lack of coordination: Secondary | ICD-10-CM | POA: Diagnosis not present

## 2012-07-16 DIAGNOSIS — I69959 Hemiplegia and hemiparesis following unspecified cerebrovascular disease affecting unspecified side: Secondary | ICD-10-CM | POA: Diagnosis not present

## 2012-07-16 DIAGNOSIS — IMO0001 Reserved for inherently not codable concepts without codable children: Secondary | ICD-10-CM | POA: Diagnosis not present

## 2012-07-16 DIAGNOSIS — M6281 Muscle weakness (generalized): Secondary | ICD-10-CM | POA: Diagnosis not present

## 2012-07-16 DIAGNOSIS — M25519 Pain in unspecified shoulder: Secondary | ICD-10-CM | POA: Diagnosis not present

## 2012-07-17 DIAGNOSIS — R279 Unspecified lack of coordination: Secondary | ICD-10-CM | POA: Diagnosis not present

## 2012-07-17 DIAGNOSIS — I69959 Hemiplegia and hemiparesis following unspecified cerebrovascular disease affecting unspecified side: Secondary | ICD-10-CM | POA: Diagnosis not present

## 2012-07-17 DIAGNOSIS — M25519 Pain in unspecified shoulder: Secondary | ICD-10-CM | POA: Diagnosis not present

## 2012-07-17 DIAGNOSIS — M6281 Muscle weakness (generalized): Secondary | ICD-10-CM | POA: Diagnosis not present

## 2012-07-17 DIAGNOSIS — IMO0001 Reserved for inherently not codable concepts without codable children: Secondary | ICD-10-CM | POA: Diagnosis not present

## 2012-07-19 ENCOUNTER — Encounter: Payer: Self-pay | Admitting: Physical Medicine & Rehabilitation

## 2012-07-19 DIAGNOSIS — R279 Unspecified lack of coordination: Secondary | ICD-10-CM | POA: Diagnosis not present

## 2012-07-19 DIAGNOSIS — Z5189 Encounter for other specified aftercare: Secondary | ICD-10-CM | POA: Diagnosis not present

## 2012-07-19 DIAGNOSIS — M6281 Muscle weakness (generalized): Secondary | ICD-10-CM | POA: Diagnosis not present

## 2012-07-19 DIAGNOSIS — I69959 Hemiplegia and hemiparesis following unspecified cerebrovascular disease affecting unspecified side: Secondary | ICD-10-CM | POA: Diagnosis not present

## 2012-07-19 DIAGNOSIS — IMO0001 Reserved for inherently not codable concepts without codable children: Secondary | ICD-10-CM | POA: Diagnosis not present

## 2012-07-19 DIAGNOSIS — M25519 Pain in unspecified shoulder: Secondary | ICD-10-CM | POA: Diagnosis not present

## 2012-07-19 DIAGNOSIS — R262 Difficulty in walking, not elsewhere classified: Secondary | ICD-10-CM | POA: Diagnosis not present

## 2012-07-22 ENCOUNTER — Ambulatory Visit: Payer: Medicare Other | Admitting: Physical Medicine & Rehabilitation

## 2012-08-02 ENCOUNTER — Ambulatory Visit (HOSPITAL_BASED_OUTPATIENT_CLINIC_OR_DEPARTMENT_OTHER): Payer: Medicare Other | Admitting: Physical Medicine & Rehabilitation

## 2012-08-02 ENCOUNTER — Encounter: Payer: Medicare Other | Attending: Physical Medicine & Rehabilitation

## 2012-08-02 ENCOUNTER — Encounter: Payer: Self-pay | Admitting: Physical Medicine & Rehabilitation

## 2012-08-02 VITALS — BP 130/47 | HR 74 | Resp 14 | Ht 69.0 in | Wt 185.0 lb

## 2012-08-02 DIAGNOSIS — M79609 Pain in unspecified limb: Secondary | ICD-10-CM | POA: Insufficient documentation

## 2012-08-02 DIAGNOSIS — M25819 Other specified joint disorders, unspecified shoulder: Secondary | ICD-10-CM | POA: Insufficient documentation

## 2012-08-02 DIAGNOSIS — I69959 Hemiplegia and hemiparesis following unspecified cerebrovascular disease affecting unspecified side: Secondary | ICD-10-CM | POA: Insufficient documentation

## 2012-08-02 DIAGNOSIS — G8110 Spastic hemiplegia affecting unspecified side: Secondary | ICD-10-CM

## 2012-08-02 NOTE — Progress Notes (Signed)
Subjective:    Patient ID: James Bell, male    DOB: 10/01/1930, 78 y.o.   MRN: [id number]  HPI Right finger flexor tone. Worst in the morning. Undergoing physical therapy. Starting to walk on a treadmill in physical therapy with harness support. Also walks at home with a hemiwalker short distances and uses a right AFO, Posterior leaf Right shoulder pain improved Pain Inventory Average Pain 0 Pain Right Now 0 My pain is intermittent  In the last 24 hours, has pain interfered with the following? General activity 3 Relation with others 4 Enjoyment of life 5 What TIME of day is your pain at its worst? night Sleep (in general) Fair  Pain is worse with: inactivity and standing Pain improves with: pacing activities Relief from Meds: 0  Mobility use a cane use a walker ability to climb steps?  yes use a wheelchair transfers alone  Function not employed: date last employed  I need assistance with the following:  household duties Do you have any goals in this area?  yes  Neuro/Psych No problems in this area  Prior Studies Any changes since last visit?  no  Physicians involved in your care Any changes since last visit?  no   Family History  Problem Relation Age of Onset  . Stroke Father   . Cancer Father 20    lung, smoker  . Stroke Brother   . Diabetes Brother 54  . Coronary artery disease Neg Hx    History   Social History  . Marital Status: Unknown    Spouse Name: N/A    Number of Children: N/A  . Years of Education: N/A   Social History Main Topics  . Smoking status: Never Smoker   . Smokeless tobacco: Never Used     Comment: "used to smoke pipe when I was younger"  . Alcohol Use: 2.4 oz/week    2 Shots of liquor, 2 Glasses of wine per week     Comment: occasional  . Drug Use: No  . Sexually Active: Yes   Other Topics Concern  . None   Social History Narrative   Caffeine: 1 cup coffee/dayDivorcedccupation: ophthalmologistEdu: MDActivity:  rehab s/p CVADiet: some water, fruits/vegetables dailyPOA - Tyrone Schimke (Daughter)   Past Surgical History  Procedure Date  . Prostate surgery 02/2009    PVP (laser)  . Tonsillectomy and adenoidectomy ~ 1945  . Hemorrhoid surgery 1991  . Cataract extraction w/ intraocular lens implant 11//2012 - 08/2011  . Cardiac catheterization 2004    normal  . Inguinal hernia repair 1981; 2002    left, then repaired  . Skin cancer excision 2002    bridge of nose, [facility]  . Colonoscopy 05/2011    normal per pt (Dr. Pearlean Brownie, [place], [place])  . Mri brain 04/2010    WNL  . Esophagogastroduodenoscopy 2006    duodenitis, medual HH, Grade 2 reflux, mild gastritis  . Colonoscopy     no records received from Dr. Pearlean Brownie  . Abi 2007    WNL   Past Medical History  Diagnosis Date  . HLD (hyperlipidemia)   . Restless leg syndrome     well controlled on mirapex  . OSA on CPAP     severe, CPAP at 12, previously on nuvigil  . Type II diabetes mellitus 2011  . H/O hiatal hernia   . GERD (gastroesophageal reflux disease)     with sliding HH  . Migraines 03/11/12    now rare  . CVA (  cerebral infarction) 03/07/12    slurred speech; right hemplegia  . Basal cell carcinoma of nose 2002    bridge of nose  . History of kidney problems 12/2010    lacerated kidney after fall  . BPH (benign prostatic hypertrophy)   . Depression     mild, on cymbalta per prior PCP  . Palpitations 01/2012    holter WNL, rare PAC/PVCs   BP 130/47  Pulse 74  Resp 14  Ht 5\' 9"  (1.753 m)  Wt 185 lb (83.915 kg)  BMI 27.32 kg/m2  SpO2 95%     Review of Systems  Constitutional: Positive for unexpected weight change.  Cardiovascular: Positive for leg swelling.  Musculoskeletal: Positive for myalgias, arthralgias and gait problem.  All other systems reviewed and are negative.       Objective:   Physical Exam  Neurological: He is alert and oriented to person, place, and time. He displays abnormal reflex. A sensory  deficit is present. He exhibits abnormal muscle tone. Coordination abnormal.  Reflex Scores:  Tricep reflexes are 3+ on the right side and 2+ on the left side.  Bicep reflexes are 3+ on the right side and 2+ on the left side.  Brachioradialis reflexes are 3+ on the right side and 2+ on the left side.  Patellar reflexes are 3+ on the right side and 2+ on the left side.  Achilles reflexes are 3+ on the right side and 2+ on the left side. Mild sensory deficit right hand light-touch Motor strength is 2 minus in the finger flexors biceps,3-/5 R deltoid 3 minus in the knee extensor on the right side Tone Ashworth 3 in the right finger and wrist flexors  No pain with active and passive range right shoulder.     Assessment & Plan:  1. Left subcortical infarct with right hemiparesis, improving with further therapy. Now is at home and requiring outpatient therapy.  Recommend Botox injection right FCU, right FCR, right FDP, right FDS, right FPL Return to clinic in one to 2 weeks for the injection

## 2012-08-09 ENCOUNTER — Ambulatory Visit (HOSPITAL_BASED_OUTPATIENT_CLINIC_OR_DEPARTMENT_OTHER): Payer: Medicare Other | Admitting: Physical Medicine & Rehabilitation

## 2012-08-09 ENCOUNTER — Encounter: Payer: Self-pay | Admitting: Physical Medicine & Rehabilitation

## 2012-08-09 VITALS — BP 143/50 | HR 93 | Resp 14 | Ht 69.0 in | Wt 184.0 lb

## 2012-08-09 DIAGNOSIS — G8110 Spastic hemiplegia affecting unspecified side: Secondary | ICD-10-CM

## 2012-08-09 NOTE — Progress Notes (Signed)
Botox Injection for spasticity using needle EMG guidance  Dilution: 50 Units/ml Indication: Severe spasticity which interferes with ADL,mobility and/or  hygiene and is unresponsive to medication management and other conservative care Informed consent was obtained after describing risks and benefits of the procedure with the patient. This includes bleeding, bruising, infection, excessive weakness, or medication side effects. A REMS form is on file and signed. Needle: 27 gauge 1 inch needle electrode Number of units per muscle Pectoralis0 Biceps0 FCR50 FCU25 FDS50 FDP50 FPL0 PT25 Gastrosoleus0 Hamstrings0 All injections were done after obtaining appropriate EMG activity and after negative drawback for blood. The patient tolerated the procedure well. Post procedure instructions were given. A followup appointment was made.

## 2012-08-09 NOTE — Patient Instructions (Signed)
OnabotulinumtoxinA injection (Medical Use) What is this medicine? ONABOTULINUMTOXINA is a neuro-muscular blocker. This medicine is used to treat crossed eyes, eyelid spasms, severe neck muscle spasms, and elbow, wrist, and finger muscle spasms. It is also used to treat excessive underarm sweating, to prevent chronic migraine headaches, and to treat loss of bladder control due to neurologic conditions such as multiple sclerosis or spinal cord injury. This medicine may be used for other purposes; ask your health care provider or pharmacist if you have questions. What should I tell my health care provider before I take this medicine? They need to know if you have any of these conditions: -breathing problems -cerebral palsy spasms -difficulty urinating -heart problems -history of surgery where this medicine is going to be used -infection at the site where this medicine is going to be used -myasthenia gravis or other neurologic disease -nerve or muscle disease -surgery plans -take medicines that treat or prevent blood clots -thyroid problems -an unusual or allergic reaction to botulinum toxin, albumin, other medicines, foods, dyes, or preservatives -pregnant or trying to get pregnant -breast-feeding How should I use this medicine? This medicine is for injection into a muscle. It is given by a health care professional in a hospital or clinic setting. Talk to your pediatrician regarding the use of this medicine in children. While this drug may be prescribed for children as young as 12 years old for selected conditions, precautions do apply. Overdosage: If you think you have taken too much of this medicine contact a poison control center or emergency room at once. NOTE: This medicine is only for you. Do not share this medicine with others. What if I miss a dose? This does not apply. What may interact with this medicine? -aminoglycoside antibiotics like gentamicin, neomycin, tobramycin -muscle  relaxants -other botulinum toxin injections This list may not describe all possible interactions. Give your health care provider a list of all the medicines, herbs, non-prescription drugs, or dietary supplements you use. Also tell them if you smoke, drink alcohol, or use illegal drugs. Some items may interact with your medicine. What should I watch for while using this medicine? Visit your doctor for regular check ups. This medicine will cause weakness in the muscle where it is injected. Tell your doctor if you feel unusually weak in other muscles. Get medical help right away if you have problems with breathing, swallowing, or talking. This medicine might make your eyelids droop or make you see blurry or double. If you have weak muscles or trouble seeing do not drive a car, use machinery, or do other dangerous activities. This medicine contains albumin from human blood. It may be possible to pass an infection in this medicine, but no cases have been reported. Talk to your doctor about the risks and benefits of this medicine. If your activities have been limited by your condition, go back to your regular routine slowly after treatment with this medicine. What side effects may I notice from receiving this medicine? Side effects that you should report to your doctor or health care professional as soon as possible: -allergic reactions like skin rash, itching or hives, swelling of the face, lips, or tongue -breathing problems -changes in vision -chest pain or tightness -eye irritation, pain -fast, irregular heartbeat -infection -numbness -speech problems -swallowing problems -unusual weakness Side effects that usually do not require medical attention (report to your doctor or health care professional if they continue or are bothersome): -bruising or pain at site where injected -drooping eyelid -dry eyes   or mouth -headache -muscles aches, pains -sensitivity to light -tearing This list may not  describe all possible side effects. Call your doctor for medical advice about side effects. You may report side effects to FDA at 1-800-FDA-1088. Where should I keep my medicine? This drug is given in a hospital or clinic and will not be stored at home. NOTE: This sheet is a summary. It may not cover all possible information. If you have questions about this medicine, talk to your doctor, pharmacist, or health care provider.  2013, [employer]. (07/12/2010 8:06:56 AM)  

## 2012-08-18 ENCOUNTER — Encounter: Payer: Self-pay | Admitting: Physical Medicine & Rehabilitation

## 2012-08-18 DIAGNOSIS — I69959 Hemiplegia and hemiparesis following unspecified cerebrovascular disease affecting unspecified side: Secondary | ICD-10-CM | POA: Diagnosis not present

## 2012-08-18 DIAGNOSIS — IMO0001 Reserved for inherently not codable concepts without codable children: Secondary | ICD-10-CM | POA: Diagnosis not present

## 2012-08-18 DIAGNOSIS — R262 Difficulty in walking, not elsewhere classified: Secondary | ICD-10-CM | POA: Diagnosis not present

## 2012-08-26 DIAGNOSIS — E785 Hyperlipidemia, unspecified: Secondary | ICD-10-CM | POA: Diagnosis not present

## 2012-08-26 DIAGNOSIS — E1149 Type 2 diabetes mellitus with other diabetic neurological complication: Secondary | ICD-10-CM | POA: Diagnosis not present

## 2012-08-26 DIAGNOSIS — E1142 Type 2 diabetes mellitus with diabetic polyneuropathy: Secondary | ICD-10-CM | POA: Diagnosis not present

## 2012-08-26 DIAGNOSIS — R609 Edema, unspecified: Secondary | ICD-10-CM | POA: Diagnosis not present

## 2012-08-26 DIAGNOSIS — Z5181 Encounter for therapeutic drug level monitoring: Secondary | ICD-10-CM | POA: Diagnosis not present

## 2012-08-26 DIAGNOSIS — E786 Lipoprotein deficiency: Secondary | ICD-10-CM | POA: Diagnosis not present

## 2012-09-18 ENCOUNTER — Encounter: Payer: Self-pay | Admitting: Physical Medicine & Rehabilitation

## 2012-09-18 DIAGNOSIS — M25519 Pain in unspecified shoulder: Secondary | ICD-10-CM | POA: Diagnosis not present

## 2012-09-18 DIAGNOSIS — Z5189 Encounter for other specified aftercare: Secondary | ICD-10-CM | POA: Diagnosis not present

## 2012-09-18 DIAGNOSIS — R279 Unspecified lack of coordination: Secondary | ICD-10-CM | POA: Diagnosis not present

## 2012-09-18 DIAGNOSIS — I69959 Hemiplegia and hemiparesis following unspecified cerebrovascular disease affecting unspecified side: Secondary | ICD-10-CM | POA: Diagnosis not present

## 2012-09-18 DIAGNOSIS — R262 Difficulty in walking, not elsewhere classified: Secondary | ICD-10-CM | POA: Diagnosis not present

## 2012-09-18 DIAGNOSIS — IMO0001 Reserved for inherently not codable concepts without codable children: Secondary | ICD-10-CM | POA: Diagnosis not present

## 2012-09-18 HISTORY — PX: URETHRAL DILATION: SUR417

## 2012-09-23 ENCOUNTER — Ambulatory Visit: Payer: Medicare Other

## 2012-09-23 ENCOUNTER — Ambulatory Visit: Payer: Medicare Other | Admitting: Physical Medicine & Rehabilitation

## 2012-10-04 ENCOUNTER — Encounter: Payer: Self-pay | Admitting: Family Medicine

## 2012-10-04 ENCOUNTER — Ambulatory Visit (INDEPENDENT_AMBULATORY_CARE_PROVIDER_SITE_OTHER): Payer: Medicare Other | Admitting: Family Medicine

## 2012-10-04 VITALS — BP 124/72 | HR 80 | Temp 97.9°F | Wt 187.2 lb

## 2012-10-04 DIAGNOSIS — R5381 Other malaise: Secondary | ICD-10-CM

## 2012-10-04 DIAGNOSIS — R195 Other fecal abnormalities: Secondary | ICD-10-CM | POA: Diagnosis not present

## 2012-10-04 DIAGNOSIS — N400 Enlarged prostate without lower urinary tract symptoms: Secondary | ICD-10-CM

## 2012-10-04 DIAGNOSIS — R5383 Other fatigue: Secondary | ICD-10-CM

## 2012-10-04 LAB — COMPREHENSIVE METABOLIC PANEL
ALT: 20 U/L (ref 0–53)
AST: 25 U/L (ref 0–37)
Albumin: 3.7 g/dL (ref 3.5–5.2)
Alkaline Phosphatase: 28 U/L — ABNORMAL LOW (ref 39–117)
BUN: 27 mg/dL — ABNORMAL HIGH (ref 6–23)
CO2: 28 mEq/L (ref 19–32)
Calcium: 9 mg/dL (ref 8.4–10.5)
Chloride: 106 mEq/L (ref 96–112)
Creatinine, Ser: 1.3 mg/dL (ref 0.4–1.5)
GFR: 54.62 mL/min — ABNORMAL LOW (ref >60.00)
Glucose, Bld: 147 mg/dL — ABNORMAL HIGH (ref 70–99)
Potassium: 4.6 mEq/L (ref 3.5–5.1)
Sodium: 139 mEq/L (ref 135–145)
Total Bilirubin: 0.5 mg/dL (ref 0.3–1.2)
Total Protein: 7.1 g/dL (ref 6.0–8.3)

## 2012-10-04 LAB — CBC WITH DIFFERENTIAL/PLATELET
Basophils Absolute: 0 10*3/uL (ref 0.0–0.1)
Basophils Relative: 0.3 % (ref 0.0–3.0)
Eosinophils Absolute: 0.1 10*3/uL (ref 0.0–0.7)
Eosinophils Relative: 2.2 % (ref 0.0–5.0)
HCT: 33.3 % — ABNORMAL LOW (ref 39.0–52.0)
Hemoglobin: 11.2 g/dL — ABNORMAL LOW (ref 13.0–17.0)
Lymphocytes Relative: 24.5 % (ref 12.0–46.0)
Lymphs Abs: 1.7 10*3/uL (ref 0.7–4.0)
MCHC: 33.5 g/dL (ref 30.0–36.0)
MCV: 90.1 fl (ref 78.0–100.0)
Monocytes Absolute: 0.7 10*3/uL (ref 0.1–1.0)
Monocytes Relative: 9.7 % (ref 3.0–12.0)
Neutro Abs: 4.4 10*3/uL (ref 1.4–7.7)
Neutrophils Relative %: 63.3 % (ref 43.0–77.0)
Platelets: 177 10*3/uL (ref 150.0–400.0)
RBC: 3.7 Mil/uL — ABNORMAL LOW (ref 4.22–5.81)
RDW: 13.4 % (ref 11.5–14.6)
WBC: 6.9 10*3/uL (ref 4.5–10.5)

## 2012-10-04 LAB — POC HEMOCCULT BLD/STL (OFFICE/1-CARD/DIAGNOSTIC): Fecal Occult Blood, POC: NEGATIVE

## 2012-10-04 MED ORDER — TAMSULOSIN HCL 0.4 MG PO CAPS: 0.4000 mg | ORAL_CAPSULE | Freq: Every day | ORAL | Status: DC

## 2012-10-04 MED ORDER — PROPRANOLOL HCL 20 MG PO TABS: 20.0000 mg | ORAL_TABLET | Freq: Every day | ORAL | Status: DC

## 2012-10-04 MED ORDER — LISINOPRIL 10 MG PO TABS: 10.0000 mg | ORAL_TABLET | Freq: Every day | ORAL | Status: DC

## 2012-10-04 MED ORDER — ZOLPIDEM TARTRATE 5 MG PO TABS: 5.0000 mg | ORAL_TABLET | Freq: Every evening | ORAL | Status: DC | PRN

## 2012-10-04 MED ORDER — PANTOPRAZOLE SODIUM 20 MG PO TBEC: 20.0000 mg | DELAYED_RELEASE_TABLET | Freq: Every day | ORAL | Status: DC

## 2012-10-04 MED ORDER — CLOPIDOGREL BISULFATE 75 MG PO TABS: 75.0000 mg | ORAL_TABLET | Freq: Every day | ORAL | Status: DC

## 2012-10-04 MED ORDER — DULOXETINE HCL 60 MG PO CPEP: 60.0000 mg | ORAL_CAPSULE | Freq: Every day | ORAL | Status: DC

## 2012-10-04 MED ORDER — HYDROCOD POLST-CHLORPHEN POLST 10-8 MG/5ML PO LQCR: 5.0000 mL | Freq: Every evening | ORAL | Status: DC | PRN

## 2012-10-04 MED ORDER — INSULIN GLARGINE 100 UNIT/ML ~~LOC~~ SOLN: 50.0000 [IU] | Freq: Every day | SUBCUTANEOUS | Status: DC

## 2012-10-04 MED ORDER — SITAGLIP PHOS-METFORMIN HCL ER 50-1000 MG PO TB24: 1.0000 | ORAL_TABLET | Freq: Two times a day (BID) | ORAL | Status: DC

## 2012-10-04 MED ORDER — ROSUVASTATIN CALCIUM 10 MG PO TABS: 10.0000 mg | ORAL_TABLET | Freq: Every day | ORAL | Status: DC

## 2012-10-04 MED ORDER — FOLIC ACID-VIT B6-VIT B12 2.5-25-1 MG PO TABS: 1.0000 | ORAL_TABLET | Freq: Every day | ORAL | Status: DC

## 2012-10-04 MED ORDER — PRAMIPEXOLE DIHYDROCHLORIDE 1 MG PO TABS: 1.0000 mg | ORAL_TABLET | Freq: Every day | ORAL | Status: DC

## 2012-10-04 MED ORDER — DUTASTERIDE 0.5 MG PO CAPS: 0.5000 mg | ORAL_CAPSULE | Freq: Every day | ORAL | Status: DC

## 2012-10-04 MED ORDER — INSULIN PEN NEEDLE 30G X 8 MM MISC: 1.0000 | Status: DC | PRN

## 2012-10-04 NOTE — Patient Instructions (Addendum)
Stool card negative today. Regardless, sent home with stool kit. Blood work today we will call you with results. Pass by Marion's office for referral to urologist for urinary frequency. meds refilled today. Try colace for stools.

## 2012-10-04 NOTE — Progress Notes (Signed)
Subjective:    Patient ID: James Bell, male    DOB: May 07, 1931, 79 y.o.   MRN: [id number]  HPI CC: fatigue.  Dr. Gaynell Face is a pleasant 79 yo with h/o left subcortical infarct sustained 02/2012 with residual R hemiparesis on plavix since, who presents today with concerns over feeling fatigued for the last 10 days.  He has also been noticing worse dry mouth to the point of affecting speech.  Some trouble swallowing as well from dry mouth.  This has been going on for last 2 weeks.  Decreased fluid intake 2/2 increased urinary frequency in setting of BPH s/p PVP 2010.  Mild LLQ discomfort over last several days along with change in stools recently - more firm, darker than normal, stickier consistency.  Brings stool sample today.  Denies recent fever, nausea/vomiting, diarrhea/constipation.  Denies dysuria, urgency, hematuria, flank pain.  Some nasal congestion.  Wt Readings from Last 3 Encounters:  10/04/12 187 lb 4 oz (84.936 kg)  08/09/12 184 lb (83.462 kg)  08/02/12 185 lb (83.915 kg)   DM - sugars running well per pt.  Most fasting <100.  This week fasting were somewhat elevated to 140s.  Per pt - colonoscopy done 05/2011 and normal.  No records of this in our chart.  Done by Dr. Pearlean Brownie.  Denies mention of divericulosis.  Have asked pt to sign ROI form for colonoscopy today.  Medications and allergies reviewed and updated in chart.  Past histories reviewed and updated if relevant as below. Patient Active Problem List  Diagnosis  . CVA (cerebrovascular accident)  . Right hemiparesis  . DM type 2 (diabetes mellitus, type 2)  . OSA (obstructive sleep apnea)  . Elevated serum creatinine  . HLD (hyperlipidemia)  . Restless leg syndrome  . GERD (gastroesophageal reflux disease)  . Migraines  . BPH (benign prostatic hypertrophy)  . Palpitations  . Depression  . Medicare annual wellness visit, initial   Past Medical History  Diagnosis Date  . HLD (hyperlipidemia)   .  Restless leg syndrome     well controlled on mirapex  . OSA on CPAP     severe, CPAP at 12, previously on nuvigil  . Type II diabetes mellitus 2011  . H/O hiatal hernia   . GERD (gastroesophageal reflux disease)     with sliding HH  . Migraines 03/11/12    now rare  . CVA (cerebral infarction) 03/07/12    slurred speech; right hemplegia  . Basal cell carcinoma of nose 2002    bridge of nose  . History of kidney problems 12/2010    lacerated kidney after fall  . BPH (benign prostatic hypertrophy)   . Depression     mild, on cymbalta per prior PCP  . Palpitations 01/2012    holter WNL, rare PAC/PVCs   Past Surgical History  Procedure Date  . Prostate surgery 02/2009    PVP (laser)  . Tonsillectomy and adenoidectomy ~ 1945  . Hemorrhoid surgery 1991  . Cataract extraction w/ intraocular lens implant 11//2012 - 08/2011  . Cardiac catheterization 2004    normal  . Inguinal hernia repair 1981; 2002    left, then repaired  . Skin cancer excision 2002    bridge of nose, BCC  . Colonoscopy 05/2011    normal per pt (Dr. Pearlean Brownie, [place], [place])  . Mri brain 04/2010    WNL  . Esophagogastroduodenoscopy 2006    duodenitis, medual HH, Grade 2 reflux, mild gastritis  . Colonoscopy 05/2011  no records received from Dr. Pearlean Brownie  . Vanice Sarah 2007    WNL   History  Substance Use Topics  . Smoking status: Never Smoker   . Smokeless tobacco: Never Used     Comment: "used to smoke pipe when I was younger"  . Alcohol Use: 2.4 oz/week    2 Shots of liquor, 2 Glasses of wine per week     Comment: occasional   Family History  Problem Relation Age of Onset  . Stroke Father   . Cancer Father 52    lung, smoker  . Stroke Brother   . Diabetes Brother 31  . Coronary artery disease Neg Hx    Allergies  Allergen Reactions  . Niacin And Related Other (See Comments)    flushing   Current Outpatient Prescriptions on File Prior to Visit  Medication Sig Dispense Refill  . Coenzyme Q10 (COQ10)  150 MG CAPS Take 1 capsule by mouth 2 (two) times daily.      . fish oil-omega-3 fatty acids 1000 MG capsule Take 2 g by mouth 2 (two) times daily.      . insulin glargine (LANTUS) 100 UNIT/ML injection Inject 50 Units into the skin at bedtime.  45 mL  3  . lisinopril (PRINIVIL,ZESTRIL) 10 MG tablet Take 1 tablet (10 mg total) by mouth daily.  90 tablet  3  . omeprazole (PRILOSEC) 20 MG capsule Take 20 mg by mouth daily.      . pramipexole (MIRAPEX) 1 MG tablet Take 1 tablet (1 mg total) by mouth at bedtime.  90 tablet  3  . propranolol (INDERAL) 20 MG tablet Take 1 tablet (20 mg total) by mouth at bedtime.  90 tablet  3  . vitamin C (ASCORBIC ACID) 500 MG tablet Take 1,000 mg by mouth daily.       Marland Kitchen zolpidem (AMBIEN) 5 MG tablet Take 1 tablet (5 mg total) by mouth at bedtime as needed.  30 tablet  1   Review of Systems Per HPI    Objective:   Physical Exam  Nursing note and vitals reviewed. Constitutional: He appears well-developed and well-nourished. No distress.  HENT:  Head: Normocephalic and atraumatic.  Mouth/Throat: Oropharynx is clear and moist. No oropharyngeal exudate.  Eyes: Conjunctivae normal and EOM are normal. Pupils are equal, round, and reactive to light. No scleral icterus.       Mild palpebral conjunctival pallor  Neck: Normal range of motion. Neck supple.  Cardiovascular: Normal rate, regular rhythm, normal heart sounds and intact distal pulses.   No murmur heard. Pulmonary/Chest: Effort normal and breath sounds normal. No respiratory distress. He has no wheezes. He has no rales.  Abdominal: Soft. Bowel sounds are normal. He exhibits no distension and no mass. There is no hepatosplenomegaly. There is tenderness (mild to palpation) in the left lower quadrant. There is no rebound, no guarding, no CVA tenderness and negative Murphy's sign.  Musculoskeletal: He exhibits no edema.  Lymphadenopathy:    He has no cervical adenopathy.  Skin: Skin is warm and dry. No rash  noted. No pallor.       Assessment & Plan:

## 2012-10-05 DIAGNOSIS — R195 Other fecal abnormalities: Secondary | ICD-10-CM | POA: Insufficient documentation

## 2012-10-05 NOTE — Assessment & Plan Note (Addendum)
On both avodart and flomax, having persistent urinary symptoms with increased frequency. At last visit with similar sxs, UA was clear and flomax was started. H/o BPH and s/p PVP (2010). Will refer to urology for further evaluation/treatment of persistent symptoms despite alpha blocker and 5a reductase inhibitor. No results found for this basename: PSA  I will see if we can add PSA to blood drawn today for baseline.

## 2012-10-05 NOTE — Assessment & Plan Note (Addendum)
Associated with fatigue.  Checked stool card with sample he brings in today - negative for occult blood. Regardless, given on plavix will check iFOB and CBC today. Some mild LLQ discomfort along with change in bowel habit - ?mild diverticulitis however duration of symptoms and afebrile points against this. Did discuss increased fluid intake to help both dry mouth and firm stools.

## 2012-10-07 ENCOUNTER — Telehealth: Payer: Self-pay | Admitting: *Deleted

## 2012-10-07 ENCOUNTER — Other Ambulatory Visit: Payer: Self-pay | Admitting: Family Medicine

## 2012-10-07 DIAGNOSIS — D649 Anemia, unspecified: Secondary | ICD-10-CM

## 2012-10-07 DIAGNOSIS — R195 Other fecal abnormalities: Secondary | ICD-10-CM

## 2012-10-07 NOTE — Telephone Encounter (Signed)
Clarification form in your IN box.

## 2012-10-07 NOTE — Telephone Encounter (Signed)
Filled and placed in Kim's box. 

## 2012-10-08 ENCOUNTER — Encounter: Payer: Self-pay | Admitting: Gastroenterology

## 2012-10-09 NOTE — Telephone Encounter (Signed)
Form faxed

## 2012-10-10 ENCOUNTER — Ambulatory Visit: Payer: Medicare Other | Admitting: Physical Medicine & Rehabilitation

## 2012-10-10 ENCOUNTER — Encounter: Payer: Medicare Other | Attending: Physical Medicine & Rehabilitation | Admitting: Physical Medicine and Rehabilitation

## 2012-10-10 ENCOUNTER — Encounter: Payer: Self-pay | Admitting: Physical Medicine and Rehabilitation

## 2012-10-10 ENCOUNTER — Other Ambulatory Visit (INDEPENDENT_AMBULATORY_CARE_PROVIDER_SITE_OTHER): Payer: Medicare Other

## 2012-10-10 ENCOUNTER — Ambulatory Visit: Payer: Medicare Other

## 2012-10-10 VITALS — BP 132/68 | HR 73 | Resp 14 | Ht 69.0 in | Wt 185.0 lb

## 2012-10-10 DIAGNOSIS — M79609 Pain in unspecified limb: Secondary | ICD-10-CM | POA: Insufficient documentation

## 2012-10-10 DIAGNOSIS — D649 Anemia, unspecified: Secondary | ICD-10-CM | POA: Diagnosis not present

## 2012-10-10 DIAGNOSIS — M25819 Other specified joint disorders, unspecified shoulder: Secondary | ICD-10-CM | POA: Diagnosis not present

## 2012-10-10 DIAGNOSIS — I6350 Cerebral infarction due to unspecified occlusion or stenosis of unspecified cerebral artery: Secondary | ICD-10-CM

## 2012-10-10 DIAGNOSIS — G8110 Spastic hemiplegia affecting unspecified side: Secondary | ICD-10-CM

## 2012-10-10 DIAGNOSIS — G8111 Spastic hemiplegia affecting right dominant side: Secondary | ICD-10-CM

## 2012-10-10 DIAGNOSIS — R195 Other fecal abnormalities: Secondary | ICD-10-CM

## 2012-10-10 DIAGNOSIS — I639 Cerebral infarction, unspecified: Secondary | ICD-10-CM

## 2012-10-10 DIAGNOSIS — I69959 Hemiplegia and hemiparesis following unspecified cerebrovascular disease affecting unspecified side: Secondary | ICD-10-CM | POA: Insufficient documentation

## 2012-10-10 LAB — IBC PANEL
Iron: 67 ug/dL (ref 42–165)
Saturation Ratios: 20.6 % (ref 20.0–50.0)
Transferrin: 232.6 mg/dL (ref 212.0–360.0)

## 2012-10-10 LAB — CBC WITH DIFFERENTIAL/PLATELET
Basophils Absolute: 0 10*3/uL (ref 0.0–0.1)
Basophils Relative: 0.4 % (ref 0.0–3.0)
Eosinophils Absolute: 0.1 10*3/uL (ref 0.0–0.7)
Eosinophils Relative: 1.5 % (ref 0.0–5.0)
HCT: 33.8 % — ABNORMAL LOW (ref 39.0–52.0)
Hemoglobin: 11.5 g/dL — ABNORMAL LOW (ref 13.0–17.0)
Lymphocytes Relative: 22.1 % (ref 12.0–46.0)
Lymphs Abs: 1.9 10*3/uL (ref 0.7–4.0)
MCHC: 34.1 g/dL (ref 30.0–36.0)
MCV: 90.5 fl (ref 78.0–100.0)
Monocytes Absolute: 0.8 10*3/uL (ref 0.1–1.0)
Monocytes Relative: 9.2 % (ref 3.0–12.0)
Neutro Abs: 5.8 10*3/uL (ref 1.4–7.7)
Neutrophils Relative %: 66.8 % (ref 43.0–77.0)
Platelets: 195 10*3/uL (ref 150.0–400.0)
RBC: 3.73 Mil/uL — ABNORMAL LOW (ref 4.22–5.81)
RDW: 13.3 % (ref 11.5–14.6)
WBC: 8.6 10*3/uL (ref 4.5–10.5)

## 2012-10-10 LAB — FERRITIN: Ferritin: 37.8 ng/mL (ref 22.0–322.0)

## 2012-10-10 LAB — FECAL OCCULT BLOOD, IMMUNOCHEMICAL: Fecal Occult Bld: NEGATIVE

## 2012-10-10 NOTE — Patient Instructions (Signed)
Continue with PT and OT, try to exercise and practise at home as much as tolerated.

## 2012-10-10 NOTE — Progress Notes (Signed)
Subjective:    Patient ID: James Bell, male    DOB: 16-Jan-1931, 79 y.o.   MRN: [id number]  HPI The patient is a 79 year old  male, who presents with s/p left subcortical infarct with right hemiparesis, 03/07/2012 . The patient denies any pain. He reports, that the botox injections into his right UE, have helped him, with moving his right UE. The patient also reports, that he is still doing PT and OT, he only needed 3 weeks of speech therapy, until he was back to baseline with his speech. Pain Inventory Average Pain 0 Pain Right Now 0 My pain is no pain  In the last 24 hours, has pain interfered with the following? General activity 0 Relation with others 0 Enjoyment of life 0 What TIME of day is your pain at its worst? n/a Sleep (in general) Fair  Pain is worse with: inactivity and standing Pain improves with: medication Relief from Meds: 1  Mobility walk without assistance use a cane ability to climb steps?  yes do you drive?  yes use a wheelchair transfers alone  Function retired Do you have any goals in this area?  yes  Neuro/Psych weakness trouble walking  Prior Studies Any changes since last visit?  no  Physicians involved in your care Any changes since last visit?  no   Family History  Problem Relation Age of Onset  . Stroke Father   . Cancer Father 36    lung, smoker  . Stroke Brother   . Diabetes Brother 40  . Coronary artery disease Neg Hx    History   Social History  . Marital Status: Unknown    Spouse Name: N/A    Number of Children: N/A  . Years of Education: N/A   Social History Main Topics  . Smoking status: Never Smoker   . Smokeless tobacco: Never Used     Comment: "used to smoke pipe when I was younger"  . Alcohol Use: 2.4 oz/week    2 Shots of liquor, 2 Glasses of wine per week     Comment: occasional  . Drug Use: No  . Sexually Active: Yes   Other Topics Concern  . None   Social History Narrative   Caffeine: 1 cup  coffee/dayDivorcedccupation: ophthalmologistEdu: MDActivity: rehab s/p CVADiet: some water, fruits/vegetables dailyPOA - Tyrone Schimke (Daughter)   Past Surgical History  Procedure Date  . Prostate surgery 02/2009    PVP (laser)  . Tonsillectomy and adenoidectomy ~ 1945  . Hemorrhoid surgery 1991  . Cataract extraction w/ intraocular lens implant 11//2012 - 08/2011  . Cardiac catheterization 2004    normal  . Inguinal hernia repair 1981; 2002    left, then repaired  . Skin cancer excision 2002    bridge of nose, BCC  . Colonoscopy 05/2011    normal per pt (Dr. Pearlean Brownie, [place], [place])  . Mri brain 04/2010    WNL  . Esophagogastroduodenoscopy 2006    duodenitis, medual HH, Grade 2 reflux, mild gastritis  . Colonoscopy 05/2011    no records received from Dr. Pearlean Brownie  . Abi 2007    WNL   Past Medical History  Diagnosis Date  . HLD (hyperlipidemia)   . Restless leg syndrome     well controlled on mirapex  . OSA on CPAP     severe, CPAP at 12, previously on nuvigil  . Type II diabetes mellitus 2011  . H/O hiatal hernia   . GERD (gastroesophageal reflux disease)  with sliding HH  . Migraines 03/11/12    now rare  . CVA (cerebral infarction) 03/07/12    slurred speech; right hemplegia  . Basal cell carcinoma of nose 2002    bridge of nose  . History of kidney problems 12/2010    lacerated kidney after fall  . BPH (benign prostatic hypertrophy)   . Depression     mild, on cymbalta per prior PCP  . Palpitations 01/2012    holter WNL, rare PAC/PVCs   BP 132/68  Pulse 73  Resp 14  Ht 5\' 9"  (1.753 m)  Wt 185 lb (83.915 kg)  BMI 27.32 kg/m2  SpO2 99%    Review of Systems  Musculoskeletal: Positive for gait problem.  Neurological: Positive for weakness.  All other systems reviewed and are negative.       Objective:   Physical Exam  Constitutional: He is oriented to person, place, and time. He appears well-developed and well-nourished.       In wheel chair  HENT:   Head: Normocephalic.  Neck: Neck supple.  Neurological: He is alert and oriented to person, place, and time.  Skin: Skin is warm and dry.       Decreased temperature in right hand, mildly swollen  Psychiatric: He has a normal mood and affect.  Symmetric normal motor tone is noted on the left,not normal on the right, in general flexors increased muscle tone, extensors decreased muscle tone.  Normal muscle bulk on the left, decreased on the right. Muscle testing reveals 5/5 muscle strength of the upper extremity, and 5/5 of the lower extremity on the left,right UE 3/5, right LE quad and hamstrings 4+/5, tibialis anterior 3+/5. Full range of motion in upper and lower extremities, except right shoulder, external rotation 0 ! Fine motor movements are very slow in the right hand, left is normal.  Sensory to touch is grossly intact DTR in the upper and lower extremity are present and symmetric 2+. No clonus is noted.  Patient in wheel chair .        Assessment & Plan:  1. Left subcortical infarct with right hemiparesis,03/07/2012, improving with further therapy.  Hand finger movement improved after Botox injection right UE. Showed patient some exercises to improve his external rotation, and fine movement of his hand. Advised patient to practice regularly at home. Follow up with Dr. Doroteo Bradford in 6 weeks.

## 2012-10-19 ENCOUNTER — Encounter: Payer: Self-pay | Admitting: Physical Medicine & Rehabilitation

## 2012-10-19 DIAGNOSIS — IMO0001 Reserved for inherently not codable concepts without codable children: Secondary | ICD-10-CM | POA: Diagnosis not present

## 2012-10-19 DIAGNOSIS — I69959 Hemiplegia and hemiparesis following unspecified cerebrovascular disease affecting unspecified side: Secondary | ICD-10-CM | POA: Diagnosis not present

## 2012-10-19 DIAGNOSIS — R262 Difficulty in walking, not elsewhere classified: Secondary | ICD-10-CM | POA: Diagnosis not present

## 2012-10-29 ENCOUNTER — Encounter: Payer: Self-pay | Admitting: Gastroenterology

## 2012-10-29 ENCOUNTER — Ambulatory Visit (INDEPENDENT_AMBULATORY_CARE_PROVIDER_SITE_OTHER): Payer: Medicare Other | Admitting: Gastroenterology

## 2012-10-29 VITALS — BP 130/68 | HR 84 | Ht 69.0 in | Wt 188.0 lb

## 2012-10-29 DIAGNOSIS — N401 Enlarged prostate with lower urinary tract symptoms: Secondary | ICD-10-CM | POA: Diagnosis not present

## 2012-10-29 DIAGNOSIS — R195 Other fecal abnormalities: Secondary | ICD-10-CM | POA: Diagnosis not present

## 2012-10-29 DIAGNOSIS — R351 Nocturia: Secondary | ICD-10-CM | POA: Diagnosis not present

## 2012-10-29 MED ORDER — ESOMEPRAZOLE MAGNESIUM 40 MG PO CPDR: 40.0000 mg | DELAYED_RELEASE_CAPSULE | Freq: Every day | ORAL | Status: DC

## 2012-10-29 NOTE — Progress Notes (Signed)
HPI: This is a    very pleasant 79 year old man whom I am meeting for the first time today.  Had dark stools for 2 days about a month ago.    No red bleeding from anywhere.  He has been on plavix since last summer for CVA last year.   Fecal occult blood testing 3 times the past 3 weeks has been persistently negative. CBC 2 weeks ago showed a slight anemia with a hemoglobin of 11.5, normocytic. His iron studies were all normal.  Has had pyrosis, trouble with acid for years.  PPI once daily (usually in AM before breakfast), usually helps symptoms very well except occasional nighttime. Usually grabs for pepcid about 1-2 times per week.  Since his CVA mild dysphagia, slows down his eating.  Has been gaining weight since CVA.  He had a colonoscopy 1-2 years ago, elsewhere. We do not have those results here for review.  Had an EGD 5-6 years ago, tells me he had duodenal inflammation  Takes alleve for HA, 3-4 times per month.   Review of systems: Pertinent positive and negative review of systems were noted in the above HPI section. Complete review of systems was performed and was otherwise normal.    Past Medical History  Diagnosis Date  . HLD (hyperlipidemia)   . Restless leg syndrome     well controlled on mirapex  . OSA on CPAP     severe, CPAP at 12, previously on nuvigil  . Type II diabetes mellitus 2011  . H/O hiatal hernia   . GERD (gastroesophageal reflux disease)     with sliding HH  . Migraines 03/11/12    now rare  . CVA (cerebral infarction) 03/07/12    slurred speech; right hemplegia  . Basal cell carcinoma of nose 2002    bridge of nose  . History of kidney problems 12/2010    lacerated kidney after fall  . BPH (benign prostatic hypertrophy)   . Depression     mild, on cymbalta per prior PCP  . Palpitations 01/2012    holter WNL, rare PAC/PVCs  . Anemia     Past Surgical History  Procedure Laterality Date  . Prostate surgery  02/2009    PVP (laser)  .  Tonsillectomy and adenoidectomy  ~ 1945  . Hemorrhoid surgery  1991  . Cataract extraction w/ intraocular lens implant  11//2012 - 08/2011  . Cardiac catheterization  2004    normal  . Inguinal hernia repair  1981; 2002    left, then repaired  . Skin cancer excision  2002    bridge of nose, BCC  . Colonoscopy  05/2011    normal per pt (Dr. Pearlean Brownie, [place], [place])  . Mri brain  04/2010    WNL  . Esophagogastroduodenoscopy  2006    duodenitis, medual HH, Grade 2 reflux, mild gastritis  . Colonoscopy  05/2011    no records received from Dr. Pearlean Brownie  . Abi  2007    WNL    Current Outpatient Prescriptions  Medication Sig Dispense Refill  . alfuzosin (UROXATRAL) 10 MG 24 hr tablet Take 10 mg by mouth daily.      . cetirizine (ZYRTEC) 10 MG tablet Take 10 mg by mouth as needed for allergies.      . chlorpheniramine-HYDROcodone (TUSSIONEX) 10-8 MG/5ML LQCR Take 5 mLs by mouth at bedtime as needed. Sedation precautions  140 mL  0  . clopidogrel (PLAVIX) 75 MG tablet Take 1 tablet (75 mg  total) by mouth daily.  90 tablet  3  . Coenzyme Q10 (COQ10) 150 MG CAPS Take 1 capsule by mouth 2 (two) times daily.      . diazepam (VALIUM) 5 MG tablet Take 5 mg by mouth as needed for anxiety.      . DULoxetine (CYMBALTA) 60 MG capsule Take 1 capsule (60 mg total) by mouth daily.  90 capsule  3  . fish oil-omega-3 fatty acids 1000 MG capsule Take 2 g by mouth 2 (two) times daily.      . fluticasone (FLONASE) 50 MCG/ACT nasal spray Place 2 sprays into the nose daily.      . Folic Acid-Vit B6-Vit B12 (FOLBEE) 2.5-25-1 MG TABS Take 1 tablet by mouth daily.  90 tablet  3  . insulin glargine (LANTUS) 100 UNIT/ML injection Inject 50 Units into the skin at bedtime.  45 mL  3  . Insulin Pen Needle (NOVOFINE) 30G X 8 MM MISC Inject 10 each into the skin as needed.  100 each  11  . lisinopril (PRINIVIL,ZESTRIL) 10 MG tablet Take 1 tablet (10 mg total) by mouth daily.  90 tablet  3  . Multiple Vitamin (MULTIVITAMIN)  tablet Take 1 tablet by mouth daily.      Marland Kitchen omeprazole (PRILOSEC) 20 MG capsule Take 20 mg by mouth daily.      . pramipexole (MIRAPEX) 1 MG tablet Take 1 tablet (1 mg total) by mouth at bedtime.  90 tablet  3  . propranolol (INDERAL) 20 MG tablet Take 1 tablet (20 mg total) by mouth at bedtime.  90 tablet  3  . rosuvastatin (CRESTOR) 10 MG tablet Take 1 tablet (10 mg total) by mouth daily.  90 tablet  3  . SALINE MIST SPRAY NA Place 1 spray into the nose as needed.      . SitaGLIPtin-MetFORMIN HCl (JANUMET XR) 50-1000 MG TB24 Take 1 tablet by mouth 2 (two) times daily before a meal.  180 tablet  3  . Tamsulosin HCl (FLOMAX) 0.4 MG CAPS Take 1 capsule (0.4 mg total) by mouth daily.  90 capsule  3  . zolmitriptan (ZOMIG) 5 MG tablet Take 5 mg by mouth as needed for migraine.      Marland Kitchen zolpidem (AMBIEN) 5 MG tablet Take 1 tablet (5 mg total) by mouth at bedtime as needed.  30 tablet  1  . vitamin C (ASCORBIC ACID) 500 MG tablet Take 1,000 mg by mouth daily.        No current facility-administered medications for this visit.    Allergies as of 10/29/2012 - Review Complete 10/29/2012  Allergen Reaction Noted  . Niacin and related Other (See Comments) 07/07/2012    Family History  Problem Relation Age of Onset  . Stroke Father   . Lung cancer Father 21     smoker  . Stroke Brother   . Diabetes Brother 84  . Coronary artery disease Neg Hx   . Lung cancer Brother   . Alcoholism Sister   . Kidney disease Sister     History   Social History  . Marital Status: Unknown    Spouse Name: N/A    Number of Children: 2  . Years of Education: N/A   Occupational History  . retired    Social History Main Topics  . Smoking status: Former Smoker -- 10 years    Types: Pipe    Quit date: 09/18/1976  . Smokeless tobacco: Never Used     Comment: "  used to smoke pipe when I was younger"  . Alcohol Use: 2.4 oz/week    2 Shots of liquor, 2 Glasses of wine per week     Comment: occasional  . Drug  Use: No  . Sexually Active: Yes   Other Topics Concern  . Not on file   Social History Narrative   Caffeine: 1 cup coffee/day   Divorced   Occupation: ophthalmologist   Edu: MD   Activity: rehab s/p CVA   Diet: some water, fruits/vegetables daily   POA - Tyrone Schimke (Daughter)       Physical Exam: BP 130/68  Pulse 84  Ht 5\' 9"  (1.753 m)  Wt 188 lb (85.276 kg)  BMI 27.75 kg/m2 Constitutional:  Sits in a wheelchair  Psychiatric: alert and oriented x3 Eyes: extraocular movements intact Mouth: oral pharynx moist, no lesions Neck: supple no lymphadenopathy Cardiovascular: heart regular rate and rhythm Lungs: clear to auscultation bilaterally Abdomen: soft, nontender, nondistended, no obvious ascites, no peritoneal signs, normal bowel sounds Extremities:  Weak right arm and right leg  Skin: no lesions on visible extremities    Assessment and plan: 79 y.o. male with  recent black-colored stools  He had black-colored stools for 2 days about 3 weeks ago. Since then his stool is been completely brown. He does take Plavix daily since he had a stroke last year. He has chronic GERD that seems to be very well controlled with daily proton pump inhibitor and periodic PPI. He has had some dysphagia since his stroke last year but no other clear alarm symptoms. He is gaining weight lately. I offered looking in his stomach now versus simply observing him for repeat overt dark stools. I think both are reasonable approaches especially given his age of 55, he is not iron deficient and only barely anemic. He does not have persistent Hemoccult-positive stool and he had a recent stroke. He opted to simply observe himself clinically he will call he is repeat black stools. Can call in a prescription for Nexium as well

## 2012-10-29 NOTE — Patient Instructions (Addendum)
We will get records from Dr. Pearlean Brownie colonoscopy and any associated pathology, [facility] [place]. Will change to nexium, take one pill 20-30 min before breakfast meal. If you have black stools again, please call Dr. Christella Hartigan and we will set up EGD.

## 2012-11-04 ENCOUNTER — Telehealth: Payer: Self-pay | Admitting: Gastroenterology

## 2012-11-04 NOTE — Telephone Encounter (Signed)
Colonoscopy Dr. Cherlyn Labella, James Bell; 06/2011 done for "personal history of colon polyps" found 4 subcentimeter polyps, severe diverticulosis" pathology shows at least one of the four polyps was adenomatous.  EGD Dr. Pearlean Brownie, 07/2011 for "hearburn, dyspepsia" mild esophagitis, small polyp in cardia, pathology shows "mild chronic gastritis and hyperplastic polyp"

## 2012-11-05 DIAGNOSIS — R351 Nocturia: Secondary | ICD-10-CM | POA: Diagnosis not present

## 2012-11-05 DIAGNOSIS — N529 Male erectile dysfunction, unspecified: Secondary | ICD-10-CM | POA: Diagnosis not present

## 2012-11-05 DIAGNOSIS — N401 Enlarged prostate with lower urinary tract symptoms: Secondary | ICD-10-CM | POA: Diagnosis not present

## 2012-11-05 DIAGNOSIS — I6789 Other cerebrovascular disease: Secondary | ICD-10-CM | POA: Diagnosis not present

## 2012-11-07 ENCOUNTER — Encounter: Payer: Self-pay | Admitting: Family Medicine

## 2012-11-11 ENCOUNTER — Telehealth: Payer: Self-pay

## 2012-11-11 NOTE — Telephone Encounter (Signed)
OK to do injection next visit make sure we have insurance approval

## 2012-11-11 NOTE — Telephone Encounter (Signed)
Patient called to see if he can get botox at his next appointment.  Please advise.

## 2012-11-12 NOTE — Telephone Encounter (Signed)
Please get botox approved for next visit if possible.

## 2012-11-16 ENCOUNTER — Encounter: Payer: Self-pay | Admitting: Physical Medicine & Rehabilitation

## 2012-11-16 DIAGNOSIS — Z5189 Encounter for other specified aftercare: Secondary | ICD-10-CM | POA: Diagnosis not present

## 2012-11-16 DIAGNOSIS — R279 Unspecified lack of coordination: Secondary | ICD-10-CM | POA: Diagnosis not present

## 2012-11-16 DIAGNOSIS — IMO0001 Reserved for inherently not codable concepts without codable children: Secondary | ICD-10-CM | POA: Diagnosis not present

## 2012-11-16 DIAGNOSIS — M25519 Pain in unspecified shoulder: Secondary | ICD-10-CM | POA: Diagnosis not present

## 2012-11-16 DIAGNOSIS — I69959 Hemiplegia and hemiparesis following unspecified cerebrovascular disease affecting unspecified side: Secondary | ICD-10-CM | POA: Diagnosis not present

## 2012-11-18 DIAGNOSIS — IMO0001 Reserved for inherently not codable concepts without codable children: Secondary | ICD-10-CM | POA: Diagnosis not present

## 2012-11-18 DIAGNOSIS — M25519 Pain in unspecified shoulder: Secondary | ICD-10-CM | POA: Diagnosis not present

## 2012-11-18 DIAGNOSIS — I69959 Hemiplegia and hemiparesis following unspecified cerebrovascular disease affecting unspecified side: Secondary | ICD-10-CM | POA: Diagnosis not present

## 2012-11-18 DIAGNOSIS — R279 Unspecified lack of coordination: Secondary | ICD-10-CM | POA: Diagnosis not present

## 2012-11-18 DIAGNOSIS — Z5189 Encounter for other specified aftercare: Secondary | ICD-10-CM | POA: Diagnosis not present

## 2012-11-20 DIAGNOSIS — Z5189 Encounter for other specified aftercare: Secondary | ICD-10-CM | POA: Diagnosis not present

## 2012-11-20 DIAGNOSIS — R279 Unspecified lack of coordination: Secondary | ICD-10-CM | POA: Diagnosis not present

## 2012-11-20 DIAGNOSIS — IMO0001 Reserved for inherently not codable concepts without codable children: Secondary | ICD-10-CM | POA: Diagnosis not present

## 2012-11-20 DIAGNOSIS — I69959 Hemiplegia and hemiparesis following unspecified cerebrovascular disease affecting unspecified side: Secondary | ICD-10-CM | POA: Diagnosis not present

## 2012-11-20 DIAGNOSIS — M25519 Pain in unspecified shoulder: Secondary | ICD-10-CM | POA: Diagnosis not present

## 2012-11-25 ENCOUNTER — Encounter: Payer: Medicare Other | Attending: Physical Medicine & Rehabilitation

## 2012-11-25 ENCOUNTER — Ambulatory Visit (HOSPITAL_BASED_OUTPATIENT_CLINIC_OR_DEPARTMENT_OTHER): Payer: Medicare Other | Admitting: Physical Medicine & Rehabilitation

## 2012-11-25 ENCOUNTER — Encounter: Payer: Self-pay | Admitting: Physical Medicine & Rehabilitation

## 2012-11-25 ENCOUNTER — Ambulatory Visit: Payer: Medicare Other | Admitting: Physical Medicine & Rehabilitation

## 2012-11-25 VITALS — BP 136/70 | HR 79 | Resp 14 | Ht 69.0 in | Wt 183.0 lb

## 2012-11-25 DIAGNOSIS — M79609 Pain in unspecified limb: Secondary | ICD-10-CM | POA: Insufficient documentation

## 2012-11-25 DIAGNOSIS — IMO0001 Reserved for inherently not codable concepts without codable children: Secondary | ICD-10-CM | POA: Diagnosis not present

## 2012-11-25 DIAGNOSIS — M25819 Other specified joint disorders, unspecified shoulder: Secondary | ICD-10-CM | POA: Insufficient documentation

## 2012-11-25 DIAGNOSIS — I69959 Hemiplegia and hemiparesis following unspecified cerebrovascular disease affecting unspecified side: Secondary | ICD-10-CM | POA: Diagnosis not present

## 2012-11-25 DIAGNOSIS — Z5189 Encounter for other specified aftercare: Secondary | ICD-10-CM | POA: Diagnosis not present

## 2012-11-25 DIAGNOSIS — R279 Unspecified lack of coordination: Secondary | ICD-10-CM | POA: Diagnosis not present

## 2012-11-25 DIAGNOSIS — M25519 Pain in unspecified shoulder: Secondary | ICD-10-CM | POA: Diagnosis not present

## 2012-11-25 DIAGNOSIS — G8110 Spastic hemiplegia affecting unspecified side: Secondary | ICD-10-CM | POA: Diagnosis not present

## 2012-11-25 NOTE — Progress Notes (Signed)
Subjective:    Patient ID: James Bell, male    DOB: 01-12-1931, 79 y.o.   MRN: [id number] his is a 79 year old right-handed male,  works as an Clinical research associate. Admitted March 11, 2012, with progressive  right-sided weakness. MRI revealed moderate-size acute infarct  affecting the left posterior putamen and left periventricular white  matter. MRA of the head was unremarkable. Carotid Dopplers with no ICA  stenosis. Echocardiogram with ejection fraction of 65% and grade 1  diastolic dysfunction. Neurology consulted, placed on Plavix therapy.  The patient was not a tPA candidate. HPIGood result with prior Botox injection 08/09/2012, spasticity has resumed in the right upper extremity  Pain Inventory  Average Pain 0  Pain Right Now 0  My pain is no pain  In the last 24 hours, has pain interfered with the following?  General activity 0  Relation with others 0  Enjoyment of life 0  What TIME of day is your pain at its worst? n/a  Sleep (in general) Fair  Pain is worse with: inactivity and standing  Pain improves with: medication  Relief from Meds: 1  Mobility  walk without assistance  use a cane  ability to climb steps? yes  do you drive? yes  use a wheelchair  transfers alone  Function  retired  Do you have any goals in this area? yes  Neuro/Psych  weakness  trouble walking  Prior Studies  Any changes since last visit? no  Physicians involved in your care  Any changes since last visit? no   Family History  Problem Relation Age of Onset  . Stroke Father   . Lung cancer Father 4     smoker  . Stroke Brother   . Diabetes Brother 44  . Coronary artery disease Neg Hx   . Lung cancer Brother   . Alcoholism Sister   . Kidney disease Sister    History   Social History  . Marital Status: Unknown    Spouse Name: N/A    Number of Children: 2  . Years of Education: N/A   Occupational History  . retired    Social History Main Topics  . Smoking status:  Former Smoker -- 10 years    Types: Pipe    Quit date: 09/18/1976  . Smokeless tobacco: Never Used     Comment: "used to smoke pipe when I was younger"  . Alcohol Use: 2.4 oz/week    2 Shots of liquor, 2 Glasses of wine per week     Comment: occasional  . Drug Use: No  . Sexually Active: Yes   Other Topics Concern  . None   Social History Narrative   Caffeine: 1 cup coffee/day   Divorced   Occupation: ophthalmologist   Edu: MD   Activity: rehab s/p CVA   Diet: some water, fruits/vegetables daily   POA - Tyrone Schimke (Daughter)   Past Surgical History  Procedure Laterality Date  . Prostate surgery  02/2009    PVP (laser)  . Tonsillectomy and adenoidectomy  ~ 1945  . Hemorrhoid surgery  1991  . Cataract extraction w/ intraocular lens implant  11//2012 - 08/2011  . Cardiac catheterization  2004    normal  . Inguinal hernia repair  1981; 2002    left, then repaired  . Skin cancer excision  2002    bridge of nose, BCC  . Colonoscopy  05/2011    normal per pt (Dr. Pearlean Brownie, [place], [place])  . Mri brain  04/2010  WNL  . Esophagogastroduodenoscopy  2006    duodenitis, medual HH, Grade 2 reflux, mild gastritis  . Colonoscopy  05/2011    no records received from Dr. Pearlean Brownie  . Abi  2007    WNL   Past Medical History  Diagnosis Date  . HLD (hyperlipidemia)   . Restless leg syndrome     well controlled on mirapex  . OSA on CPAP     severe, CPAP at 12, previously on nuvigil  . Type II diabetes mellitus 2011  . H/O hiatal hernia   . GERD (gastroesophageal reflux disease)     with sliding HH  . Migraines 03/11/12    now rare  . CVA (cerebral infarction) 03/07/12    slurred speech; right hemplegia  . Basal cell carcinoma of nose 2002    bridge of nose  . History of kidney problems 12/2010    lacerated kidney after fall  . BPH (benign prostatic hypertrophy)     urology Patsi Sears)  . Depression     mild, on cymbalta per prior PCP  . Palpitations 01/2012    holter  WNL, rare PAC/PVCs  . Anemia   . Erectile dysfunction     per urology (prostaglandin injection)   BP 136/70  Pulse 79  Resp 14  Ht 5\' 9"  (1.753 m)  Wt 183 lb (83.008 kg)  BMI 27.01 kg/m2  SpO2 99%     Review of Systems  All other systems reviewed and are negative.       Objective:   Physical Exam        Assessment & Plan:  Botox Injection for Right upper extremity spasticity using needle EMG guidance  Dilution: 50 Units/ml Indication: Severe spasticity which interferes with ADL,mobility and/or  hygiene and is unresponsive to medication management and other conservative care Informed consent was obtained after describing risks and benefits of the procedure with the patient. This includes bleeding, bruising, infection, excessive weakness, or medication side effects. A REMS form is on file and signed. Needle: 27 gauge 1 inch needle electrode Number of units per muscle Pectoralis0 Biceps0 FCR75 FCU25 FDS25 FDP50

## 2012-11-25 NOTE — Patient Instructions (Signed)
Continue outpatient therapy See me in 6 weeks we will photograph your hand and compare

## 2012-11-27 DIAGNOSIS — IMO0001 Reserved for inherently not codable concepts without codable children: Secondary | ICD-10-CM | POA: Diagnosis not present

## 2012-11-27 DIAGNOSIS — Z5189 Encounter for other specified aftercare: Secondary | ICD-10-CM | POA: Diagnosis not present

## 2012-11-27 DIAGNOSIS — R279 Unspecified lack of coordination: Secondary | ICD-10-CM | POA: Diagnosis not present

## 2012-11-27 DIAGNOSIS — I69959 Hemiplegia and hemiparesis following unspecified cerebrovascular disease affecting unspecified side: Secondary | ICD-10-CM | POA: Diagnosis not present

## 2012-11-27 DIAGNOSIS — M25519 Pain in unspecified shoulder: Secondary | ICD-10-CM | POA: Diagnosis not present

## 2012-11-29 DIAGNOSIS — E236 Other disorders of pituitary gland: Secondary | ICD-10-CM | POA: Diagnosis not present

## 2012-11-29 DIAGNOSIS — R059 Cough, unspecified: Secondary | ICD-10-CM | POA: Diagnosis not present

## 2012-11-29 DIAGNOSIS — E785 Hyperlipidemia, unspecified: Secondary | ICD-10-CM | POA: Diagnosis not present

## 2012-11-29 DIAGNOSIS — B353 Tinea pedis: Secondary | ICD-10-CM | POA: Diagnosis not present

## 2012-11-29 DIAGNOSIS — E786 Lipoprotein deficiency: Secondary | ICD-10-CM | POA: Diagnosis not present

## 2012-11-29 DIAGNOSIS — R05 Cough: Secondary | ICD-10-CM | POA: Diagnosis not present

## 2012-11-29 DIAGNOSIS — E1149 Type 2 diabetes mellitus with other diabetic neurological complication: Secondary | ICD-10-CM | POA: Diagnosis not present

## 2012-12-02 DIAGNOSIS — M25519 Pain in unspecified shoulder: Secondary | ICD-10-CM | POA: Diagnosis not present

## 2012-12-02 DIAGNOSIS — Z5189 Encounter for other specified aftercare: Secondary | ICD-10-CM | POA: Diagnosis not present

## 2012-12-02 DIAGNOSIS — I69959 Hemiplegia and hemiparesis following unspecified cerebrovascular disease affecting unspecified side: Secondary | ICD-10-CM | POA: Diagnosis not present

## 2012-12-02 DIAGNOSIS — R279 Unspecified lack of coordination: Secondary | ICD-10-CM | POA: Diagnosis not present

## 2012-12-02 DIAGNOSIS — IMO0001 Reserved for inherently not codable concepts without codable children: Secondary | ICD-10-CM | POA: Diagnosis not present

## 2012-12-04 DIAGNOSIS — IMO0001 Reserved for inherently not codable concepts without codable children: Secondary | ICD-10-CM | POA: Diagnosis not present

## 2012-12-04 DIAGNOSIS — M25519 Pain in unspecified shoulder: Secondary | ICD-10-CM | POA: Diagnosis not present

## 2012-12-04 DIAGNOSIS — R279 Unspecified lack of coordination: Secondary | ICD-10-CM | POA: Diagnosis not present

## 2012-12-04 DIAGNOSIS — Z5189 Encounter for other specified aftercare: Secondary | ICD-10-CM | POA: Diagnosis not present

## 2012-12-04 DIAGNOSIS — I69959 Hemiplegia and hemiparesis following unspecified cerebrovascular disease affecting unspecified side: Secondary | ICD-10-CM | POA: Diagnosis not present

## 2012-12-06 DIAGNOSIS — Z5189 Encounter for other specified aftercare: Secondary | ICD-10-CM | POA: Diagnosis not present

## 2012-12-06 DIAGNOSIS — M25519 Pain in unspecified shoulder: Secondary | ICD-10-CM | POA: Diagnosis not present

## 2012-12-06 DIAGNOSIS — IMO0001 Reserved for inherently not codable concepts without codable children: Secondary | ICD-10-CM | POA: Diagnosis not present

## 2012-12-06 DIAGNOSIS — R279 Unspecified lack of coordination: Secondary | ICD-10-CM | POA: Diagnosis not present

## 2012-12-06 DIAGNOSIS — I69959 Hemiplegia and hemiparesis following unspecified cerebrovascular disease affecting unspecified side: Secondary | ICD-10-CM | POA: Diagnosis not present

## 2012-12-09 DIAGNOSIS — M25519 Pain in unspecified shoulder: Secondary | ICD-10-CM | POA: Diagnosis not present

## 2012-12-09 DIAGNOSIS — IMO0001 Reserved for inherently not codable concepts without codable children: Secondary | ICD-10-CM | POA: Diagnosis not present

## 2012-12-09 DIAGNOSIS — I69959 Hemiplegia and hemiparesis following unspecified cerebrovascular disease affecting unspecified side: Secondary | ICD-10-CM | POA: Diagnosis not present

## 2012-12-09 DIAGNOSIS — R279 Unspecified lack of coordination: Secondary | ICD-10-CM | POA: Diagnosis not present

## 2012-12-09 DIAGNOSIS — Z5189 Encounter for other specified aftercare: Secondary | ICD-10-CM | POA: Diagnosis not present

## 2012-12-11 DIAGNOSIS — M25519 Pain in unspecified shoulder: Secondary | ICD-10-CM | POA: Diagnosis not present

## 2012-12-11 DIAGNOSIS — R279 Unspecified lack of coordination: Secondary | ICD-10-CM | POA: Diagnosis not present

## 2012-12-11 DIAGNOSIS — Z5189 Encounter for other specified aftercare: Secondary | ICD-10-CM | POA: Diagnosis not present

## 2012-12-11 DIAGNOSIS — I69959 Hemiplegia and hemiparesis following unspecified cerebrovascular disease affecting unspecified side: Secondary | ICD-10-CM | POA: Diagnosis not present

## 2012-12-11 DIAGNOSIS — IMO0001 Reserved for inherently not codable concepts without codable children: Secondary | ICD-10-CM | POA: Diagnosis not present

## 2012-12-13 DIAGNOSIS — Z5189 Encounter for other specified aftercare: Secondary | ICD-10-CM | POA: Diagnosis not present

## 2012-12-13 DIAGNOSIS — I69959 Hemiplegia and hemiparesis following unspecified cerebrovascular disease affecting unspecified side: Secondary | ICD-10-CM | POA: Diagnosis not present

## 2012-12-13 DIAGNOSIS — R279 Unspecified lack of coordination: Secondary | ICD-10-CM | POA: Diagnosis not present

## 2012-12-13 DIAGNOSIS — IMO0001 Reserved for inherently not codable concepts without codable children: Secondary | ICD-10-CM | POA: Diagnosis not present

## 2012-12-13 DIAGNOSIS — M25519 Pain in unspecified shoulder: Secondary | ICD-10-CM | POA: Diagnosis not present

## 2012-12-16 DIAGNOSIS — R279 Unspecified lack of coordination: Secondary | ICD-10-CM | POA: Diagnosis not present

## 2012-12-16 DIAGNOSIS — Z5189 Encounter for other specified aftercare: Secondary | ICD-10-CM | POA: Diagnosis not present

## 2012-12-16 DIAGNOSIS — M25519 Pain in unspecified shoulder: Secondary | ICD-10-CM | POA: Diagnosis not present

## 2012-12-16 DIAGNOSIS — I69959 Hemiplegia and hemiparesis following unspecified cerebrovascular disease affecting unspecified side: Secondary | ICD-10-CM | POA: Diagnosis not present

## 2012-12-16 DIAGNOSIS — IMO0001 Reserved for inherently not codable concepts without codable children: Secondary | ICD-10-CM | POA: Diagnosis not present

## 2012-12-17 ENCOUNTER — Encounter: Payer: Self-pay | Admitting: Physical Medicine & Rehabilitation

## 2012-12-17 DIAGNOSIS — R279 Unspecified lack of coordination: Secondary | ICD-10-CM | POA: Diagnosis not present

## 2012-12-17 DIAGNOSIS — R262 Difficulty in walking, not elsewhere classified: Secondary | ICD-10-CM | POA: Diagnosis not present

## 2012-12-17 DIAGNOSIS — M25519 Pain in unspecified shoulder: Secondary | ICD-10-CM | POA: Diagnosis not present

## 2012-12-17 DIAGNOSIS — I69959 Hemiplegia and hemiparesis following unspecified cerebrovascular disease affecting unspecified side: Secondary | ICD-10-CM | POA: Diagnosis not present

## 2012-12-17 DIAGNOSIS — IMO0001 Reserved for inherently not codable concepts without codable children: Secondary | ICD-10-CM | POA: Diagnosis not present

## 2012-12-23 DIAGNOSIS — M899 Disorder of bone, unspecified: Secondary | ICD-10-CM | POA: Diagnosis not present

## 2012-12-23 DIAGNOSIS — Z13820 Encounter for screening for osteoporosis: Secondary | ICD-10-CM | POA: Diagnosis not present

## 2012-12-23 DIAGNOSIS — M949 Disorder of cartilage, unspecified: Secondary | ICD-10-CM | POA: Diagnosis not present

## 2013-01-06 ENCOUNTER — Encounter: Payer: Self-pay | Admitting: Physical Medicine & Rehabilitation

## 2013-01-06 ENCOUNTER — Ambulatory Visit (HOSPITAL_BASED_OUTPATIENT_CLINIC_OR_DEPARTMENT_OTHER): Payer: Medicare Other | Admitting: Physical Medicine & Rehabilitation

## 2013-01-06 ENCOUNTER — Encounter: Payer: Medicare Other | Attending: Physical Medicine & Rehabilitation

## 2013-01-06 VITALS — BP 138/63 | HR 73 | Resp 14 | Ht 69.0 in | Wt 183.0 lb

## 2013-01-06 DIAGNOSIS — G8110 Spastic hemiplegia affecting unspecified side: Secondary | ICD-10-CM | POA: Diagnosis not present

## 2013-01-06 DIAGNOSIS — I69959 Hemiplegia and hemiparesis following unspecified cerebrovascular disease affecting unspecified side: Secondary | ICD-10-CM | POA: Diagnosis not present

## 2013-01-06 DIAGNOSIS — M62838 Other muscle spasm: Secondary | ICD-10-CM | POA: Diagnosis not present

## 2013-01-06 NOTE — Progress Notes (Signed)
Subjective:    Patient ID: James Bell, male    DOB: 12-28-1930, 79 y.o.   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 MRI revealed moderate-size acute infarct  affecting the left posterior putamen and left periventricular white  matter. MRA of the head was unremarkable. Carotid Dopplers with no ICA  stenosis. Echocardiogram with ejection fraction of 65% and grade 1  diastolic dysfunction. Neurology consulted, placed on Plavix therapy.   Botox Injection for Right upper extremity spasticity using needle EMG guidance 11/25/2012 Dilution: 50 Units/ml Indication: Severe spasticity which interferes with ADL,mobility and/or  hygiene and is unresponsive to medication management and other conservative care Informed consent was obtained after describing risks and benefits of the procedure with the patient. This includes bleeding, bruising, infection, excessive weakness, or medication side effects. A REMS form is on file and signed. Needle: 27 gauge 1 inch needle electrode Number of units per muscle Pectoralis0 Biceps0 FCR75 FCU25 FDS25 FDP50 HPI 3 times per week PT OT at [facility] Pain Inventory Average Pain 0 Pain Right Now 0 My pain is n/a  In the last 24 hours, has pain interfered with the following? General activity 0 Relation with others 0 Enjoyment of life 0 What TIME of day is your pain at its worst? varies Sleep (in general) Fair  Pain is worse with: some activites Pain improves with: pacing activities Relief from Meds: n/a  Mobility walk with assistance use a cane do you drive?  no use a wheelchair  Function retired  Neuro/Psych loss of taste or smell  Prior Studies Any changes since last visit?  no  Physicians involved in your care Any changes since last visit?  no   Family History  Problem Relation Age of Onset  . Stroke Father   . Lung cancer Father  7     smoker  . Stroke Brother   . Diabetes Brother 51  . Coronary artery disease Neg Hx   . Lung cancer Brother   . Alcoholism Sister   . Kidney disease Sister    History   Social History  . Marital Status: Unknown    Spouse Name: N/A    Number of Children: 2  . Years of Education: N/A   Occupational History  . retired    Social History Main Topics  . Smoking status: Former Smoker -- 10 years    Types: Pipe    Quit date: 09/18/1976  . Smokeless tobacco: Never Used     Comment: "used to smoke pipe when I was younger"  . Alcohol Use: 2.4 oz/week    2 Shots of liquor, 2 Glasses of wine per week     Comment: occasional  . Drug Use: No  . Sexually Active: Yes   Other Topics Concern  . None   Social History Narrative   Caffeine: 1 cup coffee/day   Divorced   Occupation: ophthalmologist   Edu: MD   Activity: rehab s/p CVA   Diet: some water, fruits/vegetables daily   POA - Tyrone Schimke (Daughter)   Past Surgical History  Procedure Laterality Date  . Prostate surgery  02/2009    PVP (laser)  . Tonsillectomy and adenoidectomy  ~ 1945  . Hemorrhoid surgery  1991  . Cataract extraction w/ intraocular lens implant  11//2012 - 08/2011  . Cardiac catheterization  2004    normal  . Inguinal hernia repair  1981; 2002    left, then  repaired  . Skin cancer excision  2002    bridge of nose, BCC  . Colonoscopy  05/2011    normal per pt (Dr. Pearlean Brownie, [place], [place])  . Mri brain  04/2010    WNL  . Esophagogastroduodenoscopy  2006    duodenitis, medual HH, Grade 2 reflux, mild gastritis  . Colonoscopy  05/2011    no records received from Dr. Pearlean Brownie  . Abi  2007    WNL   Past Medical History  Diagnosis Date  . HLD (hyperlipidemia)   . Restless leg syndrome     well controlled on mirapex  . OSA on CPAP     severe, CPAP at 12, previously on nuvigil  . Type II diabetes mellitus 2011  . H/O hiatal hernia   . GERD (gastroesophageal reflux disease)     with sliding  HH  . Migraines 03/11/12    now rare  . CVA (cerebral infarction) 03/07/12    slurred speech; right hemplegia  . Basal cell carcinoma of nose 2002    bridge of nose  . History of kidney problems 12/2010    lacerated kidney after fall  . BPH (benign prostatic hypertrophy)     urology Patsi Sears)  . Depression     mild, on cymbalta per prior PCP  . Palpitations 01/2012    holter WNL, rare PAC/PVCs  . Anemia   . Erectile dysfunction     per urology (prostaglandin injection)   BP 138/63  Pulse 73  Resp 14  Ht 5\' 9"  (1.753 m)  Wt 183 lb (83.008 kg)  BMI 27.01 kg/m2  SpO2 96%     Review of Systems  Constitutional: Positive for appetite change.  All other systems reviewed and are negative.       Objective:   Physical Exam  Nursing note and vitals reviewed. Constitutional: He is oriented to person, place, and time. He appears well-developed and well-nourished.  HENT:  Head: Normocephalic and atraumatic.  Eyes: Conjunctivae and EOM are normal. Pupils are equal, round, and reactive to light.  Neck: Normal range of motion.  Neurological: He is alert and oriented to person, place, and time. Coordination and gait abnormal.  Ashworth grade 2-3 right biceps Ashworth 1 at the finger flexors Right middle finger with increased extension compared to second fourth and fifth digits Able to oppose thumb to index finger on the right hand  Ambulation is with a quad cane. No signs of knee instability. Reduced balance.  Psychiatric: He has a normal mood and affect.          Assessment & Plan:  1. Left subcortical infarct causing right spastic hemiplegia. Improvement in wrist and finger flexors spasticity after Botox. Future injections may be done with e-stim guidance to spread out the dose within the flexor digitorum profundus and flexor digitorum sublimis. 2. Right biceps spasticity. We discussed treatment options. Musculocutaneous nerve block with phenol would be helpful in this  regard.

## 2013-01-06 NOTE — Patient Instructions (Addendum)
Graduated return to driving instructions were provided. It is recommended that the patient first drives with another licensed driver in an empty parking lot. If the patient does well with this, and they can drive on a quiet street with the licensed driver. If the patient does well with this they can drive on a busy street with a licensed driver. If the patient does well with this, the next time out they can go by himself. For the first month after resuming driving, I recommend no nighttime or Interstate driving.   Next appointment will be for nerve blocks to reduce bicep spasticity

## 2013-01-16 ENCOUNTER — Encounter: Payer: Self-pay | Admitting: Physical Medicine & Rehabilitation

## 2013-01-16 DIAGNOSIS — R262 Difficulty in walking, not elsewhere classified: Secondary | ICD-10-CM | POA: Diagnosis not present

## 2013-01-16 DIAGNOSIS — IMO0001 Reserved for inherently not codable concepts without codable children: Secondary | ICD-10-CM | POA: Diagnosis not present

## 2013-01-16 DIAGNOSIS — I69959 Hemiplegia and hemiparesis following unspecified cerebrovascular disease affecting unspecified side: Secondary | ICD-10-CM | POA: Diagnosis not present

## 2013-01-23 ENCOUNTER — Encounter: Payer: Self-pay | Admitting: Physical Medicine & Rehabilitation

## 2013-01-23 ENCOUNTER — Ambulatory Visit (HOSPITAL_BASED_OUTPATIENT_CLINIC_OR_DEPARTMENT_OTHER): Payer: Medicare Other | Admitting: Physical Medicine & Rehabilitation

## 2013-01-23 ENCOUNTER — Encounter: Payer: Medicare Other | Attending: Physical Medicine & Rehabilitation

## 2013-01-23 VITALS — BP 139/54 | HR 78 | Resp 14 | Ht 69.0 in | Wt 181.0 lb

## 2013-01-23 DIAGNOSIS — M79609 Pain in unspecified limb: Secondary | ICD-10-CM | POA: Insufficient documentation

## 2013-01-23 DIAGNOSIS — M25819 Other specified joint disorders, unspecified shoulder: Secondary | ICD-10-CM | POA: Insufficient documentation

## 2013-01-23 DIAGNOSIS — G8110 Spastic hemiplegia affecting unspecified side: Secondary | ICD-10-CM

## 2013-01-23 DIAGNOSIS — I69959 Hemiplegia and hemiparesis following unspecified cerebrovascular disease affecting unspecified side: Secondary | ICD-10-CM | POA: Insufficient documentation

## 2013-01-23 NOTE — Progress Notes (Signed)
Phenol neurolysis of the Right musculo- cutaneous nerve  Indication: Severe spasticity in the arm flexor muscles which is not responding to medical management and other conservative care and interfering with functional use and hygiene.  Informed consent was obtained after describing the risks and benefits of the procedure with the patient this includes bleeding bruising and infection as well as medication side effects. The patient elected to proceed and has given written consent. Patient placed in a supine position on the exam table. External DC stimulation was applied to the axilla using a nerve stimulator. Arm flexion twitch was obtained. The axillary region was prepped with Betadine and then entered with a 22-gauge 40 mm needle electrode under electrical stimulation guidance. Arm flexion which was obtained and confirmed. Then 4 cc of 5% phenol were injected. The patient tolerated procedure well. Post procedure instructions and followup visit were given. 

## 2013-01-23 NOTE — Patient Instructions (Addendum)
Musculocutaneous nerve block with phenol This is done to reduce elbow flexor spasticity Side effects of phenol may include swelling of the arm as well as numbness of the lateral forearm You may notice weakness with elbow flexion As with any injection bleeding bruising or infection may occur. Please call if there is any drainage from the injection site or if there is any redness that lasts beyond 2 days. This medication should take full effect in about one week Average duration of effect is 3-6 months This injection can be repeated in 3 months as needed and

## 2013-02-03 DIAGNOSIS — N318 Other neuromuscular dysfunction of bladder: Secondary | ICD-10-CM | POA: Diagnosis not present

## 2013-02-03 DIAGNOSIS — R351 Nocturia: Secondary | ICD-10-CM | POA: Diagnosis not present

## 2013-02-03 DIAGNOSIS — N401 Enlarged prostate with lower urinary tract symptoms: Secondary | ICD-10-CM | POA: Diagnosis not present

## 2013-02-03 DIAGNOSIS — N529 Male erectile dysfunction, unspecified: Secondary | ICD-10-CM | POA: Diagnosis not present

## 2013-02-05 ENCOUNTER — Encounter: Payer: Self-pay | Admitting: Family Medicine

## 2013-02-05 DIAGNOSIS — N5201 Erectile dysfunction due to arterial insufficiency: Secondary | ICD-10-CM | POA: Insufficient documentation

## 2013-02-16 ENCOUNTER — Encounter: Payer: Self-pay | Admitting: Physical Medicine & Rehabilitation

## 2013-02-16 DIAGNOSIS — IMO0001 Reserved for inherently not codable concepts without codable children: Secondary | ICD-10-CM | POA: Diagnosis not present

## 2013-02-16 DIAGNOSIS — I69959 Hemiplegia and hemiparesis following unspecified cerebrovascular disease affecting unspecified side: Secondary | ICD-10-CM | POA: Diagnosis not present

## 2013-02-16 DIAGNOSIS — R279 Unspecified lack of coordination: Secondary | ICD-10-CM | POA: Diagnosis not present

## 2013-02-16 DIAGNOSIS — M25519 Pain in unspecified shoulder: Secondary | ICD-10-CM | POA: Diagnosis not present

## 2013-02-24 ENCOUNTER — Ambulatory Visit: Payer: Medicare Other | Admitting: Physical Medicine & Rehabilitation

## 2013-02-27 DIAGNOSIS — M949 Disorder of cartilage, unspecified: Secondary | ICD-10-CM | POA: Diagnosis not present

## 2013-02-27 DIAGNOSIS — R059 Cough, unspecified: Secondary | ICD-10-CM | POA: Diagnosis not present

## 2013-02-27 DIAGNOSIS — E785 Hyperlipidemia, unspecified: Secondary | ICD-10-CM | POA: Diagnosis not present

## 2013-02-27 DIAGNOSIS — E786 Lipoprotein deficiency: Secondary | ICD-10-CM | POA: Diagnosis not present

## 2013-02-27 DIAGNOSIS — E1149 Type 2 diabetes mellitus with other diabetic neurological complication: Secondary | ICD-10-CM | POA: Diagnosis not present

## 2013-02-27 DIAGNOSIS — E236 Other disorders of pituitary gland: Secondary | ICD-10-CM | POA: Diagnosis not present

## 2013-02-27 DIAGNOSIS — R05 Cough: Secondary | ICD-10-CM | POA: Diagnosis not present

## 2013-02-27 DIAGNOSIS — M899 Disorder of bone, unspecified: Secondary | ICD-10-CM | POA: Diagnosis not present

## 2013-02-28 ENCOUNTER — Ambulatory Visit: Payer: Medicare Other | Admitting: Physical Medicine & Rehabilitation

## 2013-03-03 ENCOUNTER — Ambulatory Visit (HOSPITAL_BASED_OUTPATIENT_CLINIC_OR_DEPARTMENT_OTHER): Payer: Medicare Other | Admitting: Physical Medicine & Rehabilitation

## 2013-03-03 ENCOUNTER — Encounter: Payer: Self-pay | Admitting: Physical Medicine & Rehabilitation

## 2013-03-03 ENCOUNTER — Encounter: Payer: Medicare Other | Attending: Physical Medicine & Rehabilitation

## 2013-03-03 VITALS — BP 147/75 | HR 86 | Resp 14 | Ht 69.0 in | Wt 184.0 lb

## 2013-03-03 DIAGNOSIS — G8110 Spastic hemiplegia affecting unspecified side: Secondary | ICD-10-CM | POA: Diagnosis not present

## 2013-03-03 DIAGNOSIS — M25819 Other specified joint disorders, unspecified shoulder: Secondary | ICD-10-CM | POA: Diagnosis not present

## 2013-03-03 DIAGNOSIS — I69959 Hemiplegia and hemiparesis following unspecified cerebrovascular disease affecting unspecified side: Secondary | ICD-10-CM | POA: Insufficient documentation

## 2013-03-03 DIAGNOSIS — G4730 Sleep apnea, unspecified: Secondary | ICD-10-CM | POA: Diagnosis not present

## 2013-03-03 DIAGNOSIS — M79609 Pain in unspecified limb: Secondary | ICD-10-CM | POA: Diagnosis not present

## 2013-03-03 NOTE — Patient Instructions (Addendum)
Please call me when you feel your wrist and fingers are getting tighter we could repeat the Botox anytime.  The phenol injection should last another 3-4 months. If you feel like her biceps is getting tighter we can repeat it in another 2 months  If your therapy benefits run out, consider [facility] department of physical therapy. Bradly Chris is the Secondary school teacher

## 2013-03-03 NOTE — Progress Notes (Signed)
Subjective:    Patient ID: James Bell, male    DOB: 08/03/1931, 79 y.o.   MRN: [id number] Chief complaint poor sleep History of CPAP use. Saw a sleep specialist in another city. No recent followups. Would like to be seen by a local sleep specialist. HPI Jan 23, 2013 2:34 PM EDT  01/23/2013   Phenol neurolysis of the Right musculo- cutaneous nerve   Botox Injection for Right upper extremity spasticity using needle EMG guidance  11/25/2012  Dilution: 50 Units/ml Indication: Severe spasticity which interferes with ADL,mobility and/or  hygiene and is unresponsive to medication management and other conservative care Informed consent was obtained after describing risks and benefits of the procedure with the patient. This includes bleeding, bruising, infection, excessive weakness, or medication side effects. A REMS form is on file and signed. Needle: 27 gauge 1 inch needle electrode Number of units per muscle Pectoralis0 Biceps0 FCR75 FCU25 FDS25 FDP50  PT/OT 3 x per week at [facility]  Pain Inventory Average Pain 0 Pain Right Now 0 My pain is no pain  In the last 24 hours, has pain interfered with the following? General activity 0 Relation with others 0 Enjoyment of life 0 What TIME of day is your pain at its worst? no pain Sleep (in general) Fair  Pain is worse with: no pain Pain improves with: no pain Relief from Meds: 0  Mobility walk with assistance use a cane ability to climb steps?  yes do you drive?  yes use a wheelchair transfers alone  Function not employed: date last employed before stroke I need assistance with the following:  household duties and shopping  Neuro/Psych weakness trouble walking  Prior Studies Any changes since last visit?  no  Physicians involved in your care Any changes since last visit?  no   Family History  Problem Relation Age of Onset  . Stroke Father   . Lung cancer Father 68     smoker  . Stroke Brother    . Diabetes Brother 56  . Coronary artery disease Neg Hx   . Lung cancer Brother   . Alcoholism Sister   . Kidney disease Sister    History   Social History  . Marital Status: Unknown    Spouse Name: N/A    Number of Children: 2  . Years of Education: N/A   Occupational History  . retired    Social History Main Topics  . Smoking status: Former Smoker -- 10 years    Types: Pipe    Quit date: 09/18/1976  . Smokeless tobacco: Never Used     Comment: "used to smoke pipe when I was younger"  . Alcohol Use: 2.4 oz/week    2 Shots of liquor, 2 Glasses of wine per week     Comment: occasional  . Drug Use: No  . Sexually Active: Yes   Other Topics Concern  . None   Social History Narrative   Caffeine: 1 cup coffee/day   Divorced   Occupation: ophthalmologist   Edu: MD   Activity: rehab s/p CVA   Diet: some water, fruits/vegetables daily   POA - Tyrone Schimke (Daughter)   Past Surgical History  Procedure Laterality Date  . Prostate surgery  02/2009    PVP (laser)  . Tonsillectomy and adenoidectomy  ~ 1945  . Hemorrhoid surgery  1991  . Cataract extraction w/ intraocular lens implant  11//2012 - 08/2011  . Cardiac catheterization  2004    normal  . Inguinal  hernia repair  1981; 2002    left, then repaired  . Skin cancer excision  2002    bridge of nose, BCC  . Colonoscopy  05/2011    normal per pt (Dr. Pearlean Brownie, [place], [place])  . Mri brain  04/2010    WNL  . Esophagogastroduodenoscopy  2006    duodenitis, medual HH, Grade 2 reflux, mild gastritis  . Colonoscopy  05/2011    no records received from Dr. Pearlean Brownie  . Abi  2007    WNL   Past Medical History  Diagnosis Date  . HLD (hyperlipidemia)   . Restless leg syndrome     well controlled on mirapex  . OSA on CPAP     severe, CPAP at 12, previously on nuvigil  . Type II diabetes mellitus 2011  . H/O hiatal hernia   . GERD (gastroesophageal reflux disease)     with sliding HH  . Migraines 03/11/12    now  rare  . CVA (cerebral infarction) 03/07/12    slurred speech; right hemplegia  . Basal cell carcinoma of nose 2002    bridge of nose  . History of kidney problems 12/2010    lacerated kidney after fall  . BPH with obstruction/lower urinary tract symptoms     failed flomax, uroxatral, myrbetriq - sees urology Patsi Sears) pending videourodynamics  . Depression     mild, on cymbalta per prior PCP  . Palpitations 01/2012    holter WNL, rare PAC/PVCs  . Anemia   . Erectile dysfunction     per urology (prostaglandin injection)   BP 147/75  Pulse 86  Resp 14  Ht 5\' 9"  (1.753 m)  Wt 184 lb (83.462 kg)  BMI 27.16 kg/m2  SpO2 96%   Review of Systems  Musculoskeletal: Positive for gait problem.  Neurological: Positive for weakness.  All other systems reviewed and are negative.       Objective:   Physical Exam   Ashworth grade 2 right biceps, right wrist flexors and right FDS as well as right FPL Motor strength 3 minus right deltoid, biceps, triceps, finger flexors and extensors. 4/5 right hip flexor knee extensor, 5/5 left hip flexor knee extensor  AFO right ankle    Assessment & Plan:  1. Right spastic hemiplegia secondary to left putamen and left periventricular white matter infarcts onset 03/11/2012. His right upper extremity spasticity is well managed with combination of physical therapy as well as botulinum toxin injection to the right flexor forearm as well as musculocutaneous nerve block with phenol. As I discussed with patient Botox typically last 3-4 months. It is still helping him from the previous injection in March. He will call when he feels like his hand and wrist flexors are getting tighter. We discussed at the phenol last 3-6 months. If his biceps spasticity returns he can call and repeat muscular cutaneous nerve block phenol.  He is one year post CVA now. Most likely PT insurance benefits will be coming to an end. He may benefit from additional sessions with  [facility] PT   2. Sleep apnea unspecified in the setting of CVA. We'll send him to [facility] neurolog[facility] clinic to be evaluated

## 2013-03-11 ENCOUNTER — Telehealth: Payer: Self-pay

## 2013-03-11 DIAGNOSIS — G4730 Sleep apnea, unspecified: Secondary | ICD-10-CM

## 2013-03-11 DIAGNOSIS — G8110 Spastic hemiplegia affecting unspecified side: Secondary | ICD-10-CM

## 2013-03-11 NOTE — Telephone Encounter (Signed)
Please order speech therapy at [facility]

## 2013-03-11 NOTE — Telephone Encounter (Signed)
Patient called to follow up on sleep study.  Notes faxed 03/03/13 to [facility].  Patient informed.  He is also wanting Speech therapy at [facility].  Please order.

## 2013-03-12 NOTE — Telephone Encounter (Signed)
Order for speech therapy placed.

## 2013-03-13 DIAGNOSIS — E1149 Type 2 diabetes mellitus with other diabetic neurological complication: Secondary | ICD-10-CM | POA: Diagnosis not present

## 2013-03-18 ENCOUNTER — Encounter: Payer: Self-pay | Admitting: Physical Medicine & Rehabilitation

## 2013-03-18 DIAGNOSIS — I69959 Hemiplegia and hemiparesis following unspecified cerebrovascular disease affecting unspecified side: Secondary | ICD-10-CM | POA: Diagnosis not present

## 2013-03-18 DIAGNOSIS — M6281 Muscle weakness (generalized): Secondary | ICD-10-CM | POA: Diagnosis not present

## 2013-03-18 DIAGNOSIS — M25519 Pain in unspecified shoulder: Secondary | ICD-10-CM | POA: Diagnosis not present

## 2013-03-18 DIAGNOSIS — IMO0001 Reserved for inherently not codable concepts without codable children: Secondary | ICD-10-CM | POA: Diagnosis not present

## 2013-03-27 ENCOUNTER — Other Ambulatory Visit: Payer: Self-pay

## 2013-04-01 ENCOUNTER — Institutional Professional Consult (permissible substitution): Payer: Medicare Other | Admitting: Neurology

## 2013-04-04 ENCOUNTER — Ambulatory Visit (INDEPENDENT_AMBULATORY_CARE_PROVIDER_SITE_OTHER): Payer: Medicare Other | Admitting: Neurology

## 2013-04-04 ENCOUNTER — Encounter: Payer: Self-pay | Admitting: Neurology

## 2013-04-04 VITALS — BP 144/73 | HR 79 | Temp 97.5°F | Ht 69.0 in | Wt 188.0 lb

## 2013-04-04 DIAGNOSIS — G4733 Obstructive sleep apnea (adult) (pediatric): Secondary | ICD-10-CM | POA: Diagnosis not present

## 2013-04-04 NOTE — Progress Notes (Signed)
Subjective:    Patient ID: James Bell is a 79 y.o. male.  HPI  Huston Foley, MD, PhD [facility] [address] [address] [place], [place] [address]   Dear James Bell,   I saw your patient, James Bell, upon your kind request in my neurologic clinic today for initial consultation of his sleep disorder, in particular his obstructive sleep apnea in the setting of prior stroke.  He is accompanied by his lady friend, [name], today. As you know, James Bell is a very pleasant 79 year old right-handed gentleman - retired ophthalmologist -with an underlying medical history of hyperlipidemia, restless leg syndrome, diabetes, reflux disease, migraines, stroke with residual right-sided hemiplegia in June 2013 for which he now receives Botox injections in the right upper extremity, basal cell carcinoma of the nose as well as a prior diagnosis of obstructive sleep apnea on CPAP of currently 7 cm. His old CPAP machine was set for 10 cm, but was defective and he bought a new machine on his own, which was set at 7 cm.    His typical bedtime is reported to be around 9 to 10 PM and usual wake time is around 5 AM. Sleep onset typically occurs within a few minutes. He reports feeling not well rested upon awakening. He wakes up on an average 2 in the middle of the night and has to go to the bathroom 2 times on a typical night. He denies recent morning headaches.  He reports excessive daytime somnolence (EDS) and His Epworth Sleepiness Score (ESS) is 15/24 today. He has been known to snore for the past many years. He had at leasst 2 sleep studies in the past and he has records, which he will fax. He feels that the pressure of 7 is no longer adequate as he does not wake up rested and has daytime somnolence. He feels that he may be snoring despite using CPAP. He had another machine which could not be fixed.  He is still undergoing physical therapy which is coming to him and he  states. He uses a 4 prong cane on the left side to mobilize. He has not had any recent falls and Botox injections in the right number extremity have been helpful. He has been seen by James Bell for his stroke management but has not made another followup appointment as previously instructed.    His Past Medical History Is Significant For: Past Medical History  Diagnosis Date  . HLD (hyperlipidemia)   . Restless leg syndrome     well controlled on mirapex  . OSA on CPAP     severe, CPAP at 12, previously on nuvigil  . Type II diabetes mellitus 2011  . H/O hiatal hernia   . GERD (gastroesophageal reflux disease)     with sliding HH  . Migraines 03/11/12    now rare  . CVA (cerebral infarction) 03/07/12    slurred speech; right hemplegia  . Basal cell carcinoma of nose 2002    bridge of nose  . History of kidney problems 12/2010    lacerated kidney after fall  . BPH with obstruction/lower urinary tract symptoms     failed flomax, uroxatral, myrbetriq - sees urology James Bell) pending videourodynamics  . Depression     mild, on cymbalta per prior PCP  . Palpitations 01/2012    holter WNL, rare PAC/PVCs  . Anemia   . Erectile dysfunction     per urology (prostaglandin injection)    His Past  Surgical History Is Significant For: Past Surgical History  Procedure Laterality Date  . Prostate surgery  02/2009    PVP (laser)  . Tonsillectomy and adenoidectomy  ~ 1945  . Hemorrhoid surgery  1991  . Cataract extraction w/ intraocular lens implant  11//2012 - 08/2011  . Cardiac catheterization  2004    normal  . Inguinal hernia repair  1981; 2002    left, then repaired  . Skin cancer excision  2002    bridge of nose, BCC  . Colonoscopy  05/2011    normal per pt (James Bell, [place], [place])  . Mri brain  04/2010    WNL  . Esophagogastroduodenoscopy  2006    duodenitis, medual HH, Grade 2 reflux, mild gastritis  . Colonoscopy  05/2011    no records received from James Bell  . Vanice Sarah   2007    WNL    His Family History Is Significant For: Family History  Problem Relation Age of Onset  . Stroke Father   . Lung cancer Father 97     smoker  . Stroke Brother   . Diabetes Brother 72  . Coronary artery disease Neg Hx   . Lung cancer Brother   . Alcoholism Sister   . Kidney disease Sister     His Social History Is Significant For: History   Social History  . Marital Status: Unknown    Spouse Name: N/A    Number of Children: 2  . Years of Education: N/A   Occupational History  . retired    Social History Main Topics  . Smoking status: Former Smoker -- 10 years    Types: Pipe    Quit date: 09/18/1976  . Smokeless tobacco: Never Used     Comment: "used to smoke pipe when I was younger"  . Alcohol Use: 2.4 oz/week    2 Shots of liquor, 2 Glasses of wine per week     Comment: occasional  . Drug Use: No  . Sexually Active: Yes   Other Topics Concern  . None   Social History Narrative   Caffeine: 1 cup coffee/day   Divorced   Occupation: ophthalmologist   Edu: MD   Activity: rehab s/p CVA   Diet: some water, fruits/vegetables daily   POA - James Bell (Daughter)    His Allergies Are:  Allergies  Allergen Reactions  . Niacin And Related Other (See Comments)    flushing  :   His Current Medications Are:  Outpatient Encounter Prescriptions as of 04/04/2013  Medication Sig Dispense Refill  . cetirizine (ZYRTEC) 10 MG tablet Take 10 mg by mouth as needed for allergies.      Marland Kitchen clopidogrel (PLAVIX) 75 MG tablet Take 1 tablet (75 mg total) by mouth daily.  90 tablet  3  . Coenzyme Q10 (COQ10) 150 MG CAPS Take 1 capsule by mouth 2 (two) times daily.      . DULoxetine (CYMBALTA) 60 MG capsule Take 1 capsule (60 mg total) by mouth daily.  90 capsule  3  . esomeprazole (NEXIUM) 40 MG capsule Take 1 capsule (40 mg total) by mouth daily.  90 capsule  3  . fish oil-omega-3 fatty acids 1000 MG capsule Take 2 g by mouth 2 (two) times daily.      . Folic  Acid-Vit B6-Vit B12 (FOLBEE) 2.5-25-1 MG TABS Take 1 tablet by mouth daily.  90 tablet  3  . insulin glargine (LANTUS) 100 UNIT/ML injection Inject 50 Units into  the skin at bedtime.  45 mL  3  . Multiple Vitamin (MULTIVITAMIN) tablet Take 1 tablet by mouth daily.      . pramipexole (MIRAPEX) 1 MG tablet Take 1 tablet (1 mg total) by mouth at bedtime.  90 tablet  3  . propranolol (INDERAL) 20 MG tablet Take 1 tablet (20 mg total) by mouth at bedtime.  90 tablet  3  . rosuvastatin (CRESTOR) 10 MG tablet Take 1 tablet (10 mg total) by mouth daily.  90 tablet  3  . SitaGLIPtin-MetFORMIN HCl (JANUMET XR) 50-1000 MG TB24 Take 1 tablet by mouth 2 (two) times daily before a meal.  180 tablet  3  . vitamin C (ASCORBIC ACID) 500 MG tablet Take 1,000 mg by mouth daily.       . [DISCONTINUED] Insulin Pen Needle (NOVOFINE) 30G X 8 MM MISC Inject 10 each into the skin as needed.  100 each  11   No facility-administered encounter medications on file as of 04/04/2013.  :   Review of Systems  Constitutional: Positive for fatigue and unexpected weight change.  Genitourinary:       Impotence  Neurological: Positive for weakness.       Restless legs Slurred Speech,  Psychiatric/Behavioral: Positive for sleep disturbance.       Decreased Energy    Objective:  Neurologic Exam  Physical Exam Physical Examination:   Filed Vitals:   04/04/13 1107  BP: 144/73  Pulse: 79  Temp: 97.5 F (36.4 C)    General Examination: The patient is a very pleasant 79 y.o. male in no acute distress. He appears well-developed and well-nourished and well groomed.   HEENT: Normocephalic, atraumatic, pupils are equal, round and reactive to light and accommodation. Extraocular tracking is good without limitation to gaze excursion or nystagmus noted. Normal smooth pursuit is noted. Hearing is grossly intact with hearing aid in place. Face is asymmetric with mild R lower facial weakness. Speech is minimally dysarthric. There is  minimal hypophonia. There is no lip, neck/head, jaw or voice tremor. Neck is supple with full range of passive and active motion. There are no carotid bruits on auscultation. Oropharynx exam reveals: mild mouth dryness, adequate dental hygiene and mild airway crowding.   Chest: Clear to auscultation without wheezing, rhonchi or crackles noted.  Heart: S1+S2+0, regular and normal without murmurs, rubs or gallops noted.   Abdomen: Soft, non-tender and non-distended with normal bowel sounds appreciated on auscultation.  Extremities: There is no pitting edema in the distal lower extremities bilaterally. Pedal pulses are intact. R leg is in an AFO.  Skin: Warm and dry without trophic changes noted. There are no varicose veins.  Musculoskeletal: exam reveals no obvious joint deformities, tenderness or joint swelling or erythema.   Neurologically:  Mental status: The patient is awake, alert and oriented in all 4 spheres. His memory, attention, language and knowledge are appropriate. There is no aphasia, agnosia, apraxia or anomia. Speech is clear with normal prosody and enunciation. Thought process is linear. Mood is congruent and affect is normal.  Cranial nerves are as described above under HEENT exam. In addition, shoulder shrug is normal with equal shoulder height noted. Motor exam: Normal bulk, strength and tone is noted on the L. On the R, there is 3/5 weakness in the RUE with increase in tone and 4/5 weakness in the RLE with spasticity noted.  Sensory exam is intact to light touch, pinprick, vibration, temperature sense.  Gait, station and balance: he stands with  mild difficulty after 2 attempts and uses a 4 prong cane on the L with mild circumduction on the RLE.   Assessment and Plan:   Assessment and Plan:  In summary, SHIVANSH HARDAWAY is a very pleasant 79 y.o.-year old male with a history of Diabetes, migraine headaches, restless leg syndrome, stroke in June 2013 with residual  right-sided weakness, as well as a prior diagnosis of obstructive sleep apnea who has been using a CPAP machine that he bought himself set at a pressure of 7 which she feels is no longer adequate. He endorses residual daytime somnolence with an Epworth sleepiness score of 15 today as well as residual snoring and nonrestorative sleep. He is advised that we could get him on an auto CPAP trial at home for 30 days and I will set the pressure range between 7 and 12 cm for now and reevaluate him based on a 30 day download of his compliance data. I would like to see him back in about 6 weeks for this. I also encouraged him to make a followup appointment as previously recommended with James Bell for stroke management. He was agreeable with the plan. To that and, I entered a DME order for a CPAP machine and we will try to get him a DME company locally in [place].  Thank you very much for allowing me to participate in the care of this nice patient. If I can be of any further assistance to you please do not hesitate to call me at [phone].  Sincerely,   Huston Foley, MD, PhD

## 2013-04-04 NOTE — Patient Instructions (Addendum)
We will get you on an auto-PAP machine for about 1 month, then I will see you back and look at your compliance info.

## 2013-04-06 ENCOUNTER — Encounter: Payer: Self-pay | Admitting: Family Medicine

## 2013-04-08 ENCOUNTER — Telehealth: Payer: Self-pay | Admitting: *Deleted

## 2013-04-08 NOTE — Telephone Encounter (Signed)
Called and spoke to pt, we need copies of sleep studies in order to process order for auto-pap.  He had copies of those studies faxed to [facility] this morning, but I can't seem to find them and they haven't been seen in medical records.  I asked if he could fax them again directly to the sleep lab.  He asked me to call [name] at [phone] to request that she fax those records.   I called that number and left a detailed message with the sleep lab fax number and asked her to send those records to [name] so we could proceed with the order.

## 2013-04-09 NOTE — Telephone Encounter (Signed)
I have something on steve's desk regarding pt. Will look at it.  s

## 2013-04-11 ENCOUNTER — Telehealth: Payer: Self-pay | Admitting: *Deleted

## 2013-04-11 DIAGNOSIS — E119 Type 2 diabetes mellitus without complications: Secondary | ICD-10-CM

## 2013-04-11 NOTE — Telephone Encounter (Signed)
Forms for medication substitution in your IN box for Cymbalta and Crestor.

## 2013-04-13 NOTE — Telephone Encounter (Signed)
plz call pt: I received notice from insurance regarding requested changes in meds. Would he be willing to do trial of lipitor 40mg  (equivalent to crestor 10mg  he now takes)? Would he be willing to do trial of venlafaxine ER instead of cymbalta (same family of meds) - if desires, will cross taper off.  Forms in kim's box.

## 2013-04-14 MED ORDER — DULOXETINE HCL 30 MG PO CPEP: 30.0000 mg | ORAL_CAPSULE | Freq: Every day | ORAL | Status: DC

## 2013-04-14 MED ORDER — ATORVASTATIN CALCIUM 40 MG PO TABS: 40.0000 mg | ORAL_TABLET | Freq: Every day | ORAL | Status: DC

## 2013-04-14 NOTE — Telephone Encounter (Signed)
Spoke with patient and he says he is very reluctant to changing meds. He says he feels fine on current regimen and doesn't want to change.

## 2013-04-14 NOTE — Telephone Encounter (Signed)
clarified request with Dr. Jeanie Sewer.  He is willing to do trial of lipitor 40mg  which would be equivalent LDL reduction as what he currently has.  Selena Batten can you send me back this form?  [facility]d [facility] He would like to eventually come off cymbalta - will send in lower dose 30mg  dose for next several months to start taper.  i have sent 30d supply of cymbalta to [facility] Pt requests referral to ophthalmologist for diabetic eye exam - will refer to Dr. Hazle Quant. Selena Batten can we call pt to schedule DM f/u appt with me as he's due.

## 2013-04-15 ENCOUNTER — Encounter: Payer: Self-pay | Admitting: *Deleted

## 2013-04-15 DIAGNOSIS — N529 Male erectile dysfunction, unspecified: Secondary | ICD-10-CM | POA: Diagnosis not present

## 2013-04-15 DIAGNOSIS — N401 Enlarged prostate with lower urinary tract symptoms: Secondary | ICD-10-CM | POA: Diagnosis not present

## 2013-04-15 DIAGNOSIS — N318 Other neuromuscular dysfunction of bladder: Secondary | ICD-10-CM | POA: Diagnosis not present

## 2013-04-15 DIAGNOSIS — R351 Nocturia: Secondary | ICD-10-CM | POA: Diagnosis not present

## 2013-04-15 NOTE — Telephone Encounter (Signed)
Appt scheduled

## 2013-04-17 ENCOUNTER — Encounter: Payer: Self-pay | Admitting: Family Medicine

## 2013-04-18 ENCOUNTER — Encounter: Payer: Self-pay | Admitting: Physical Medicine & Rehabilitation

## 2013-04-18 ENCOUNTER — Telehealth: Payer: Self-pay

## 2013-04-18 DIAGNOSIS — R279 Unspecified lack of coordination: Secondary | ICD-10-CM | POA: Diagnosis not present

## 2013-04-18 DIAGNOSIS — I69959 Hemiplegia and hemiparesis following unspecified cerebrovascular disease affecting unspecified side: Secondary | ICD-10-CM | POA: Diagnosis not present

## 2013-04-18 DIAGNOSIS — R490 Dysphonia: Secondary | ICD-10-CM | POA: Diagnosis not present

## 2013-04-18 DIAGNOSIS — I69922 Dysarthria following unspecified cerebrovascular disease: Secondary | ICD-10-CM | POA: Diagnosis not present

## 2013-04-18 DIAGNOSIS — M6281 Muscle weakness (generalized): Secondary | ICD-10-CM | POA: Diagnosis not present

## 2013-04-18 DIAGNOSIS — M25519 Pain in unspecified shoulder: Secondary | ICD-10-CM | POA: Diagnosis not present

## 2013-04-18 DIAGNOSIS — Z5189 Encounter for other specified aftercare: Secondary | ICD-10-CM | POA: Diagnosis not present

## 2013-04-18 DIAGNOSIS — R262 Difficulty in walking, not elsewhere classified: Secondary | ICD-10-CM | POA: Diagnosis not present

## 2013-04-18 DIAGNOSIS — IMO0001 Reserved for inherently not codable concepts without codable children: Secondary | ICD-10-CM | POA: Diagnosis not present

## 2013-04-18 NOTE — Telephone Encounter (Signed)
Duloxetine requires prior auth; form in Dr Timoteo Expose in box.

## 2013-04-20 NOTE — Telephone Encounter (Signed)
filled and placed in my out box 

## 2013-04-21 ENCOUNTER — Ambulatory Visit (INDEPENDENT_AMBULATORY_CARE_PROVIDER_SITE_OTHER): Payer: Medicare Other | Admitting: Family Medicine

## 2013-04-21 ENCOUNTER — Encounter: Payer: Self-pay | Admitting: Family Medicine

## 2013-04-21 VITALS — BP 140/80 | HR 80 | Temp 98.1°F | Wt 185.0 lb

## 2013-04-21 DIAGNOSIS — E1149 Type 2 diabetes mellitus with other diabetic neurological complication: Secondary | ICD-10-CM

## 2013-04-21 DIAGNOSIS — E785 Hyperlipidemia, unspecified: Secondary | ICD-10-CM | POA: Diagnosis not present

## 2013-04-21 DIAGNOSIS — E1142 Type 2 diabetes mellitus with diabetic polyneuropathy: Secondary | ICD-10-CM

## 2013-04-21 DIAGNOSIS — E1140 Type 2 diabetes mellitus with diabetic neuropathy, unspecified: Secondary | ICD-10-CM

## 2013-04-21 DIAGNOSIS — K219 Gastro-esophageal reflux disease without esophagitis: Secondary | ICD-10-CM | POA: Diagnosis not present

## 2013-04-21 DIAGNOSIS — R490 Dysphonia: Secondary | ICD-10-CM | POA: Diagnosis not present

## 2013-04-21 DIAGNOSIS — F3289 Other specified depressive episodes: Secondary | ICD-10-CM

## 2013-04-21 DIAGNOSIS — F32A Depression, unspecified: Secondary | ICD-10-CM

## 2013-04-21 DIAGNOSIS — F329 Major depressive disorder, single episode, unspecified: Secondary | ICD-10-CM

## 2013-04-21 DIAGNOSIS — R799 Abnormal finding of blood chemistry, unspecified: Secondary | ICD-10-CM

## 2013-04-21 DIAGNOSIS — R7989 Other specified abnormal findings of blood chemistry: Secondary | ICD-10-CM

## 2013-04-21 LAB — MICROALBUMIN / CREATININE URINE RATIO
Creatinine,U: 52.1 mg/dL
Microalb Creat Ratio: 3.8 mg/g (ref 0.0–30.0)
Microalb, Ur: 2 mg/dL — ABNORMAL HIGH (ref 0.0–1.9)

## 2013-04-21 LAB — BASIC METABOLIC PANEL
BUN: 19 mg/dL (ref 6–23)
CO2: 27 mEq/L (ref 19–32)
Calcium: 9 mg/dL (ref 8.4–10.5)
Chloride: 103 mEq/L (ref 96–112)
Creatinine, Ser: 1.2 mg/dL (ref 0.4–1.5)
GFR: 61.36 mL/min (ref >60.00)
Glucose, Bld: 82 mg/dL (ref 70–99)
Potassium: 3.9 mEq/L (ref 3.5–5.1)
Sodium: 138 mEq/L (ref 135–145)

## 2013-04-21 LAB — HEMOGLOBIN A1C: Hgb A1c MFr Bld: 6.8 % — ABNORMAL HIGH (ref 4.6–6.5)

## 2013-04-21 MED ORDER — DIAZEPAM 5 MG PO TABS: 5.0000 mg | ORAL_TABLET | Freq: Three times a day (TID) | ORAL | Status: DC | PRN

## 2013-04-21 MED ORDER — SERTRALINE HCL 50 MG PO TABS: ORAL_TABLET | ORAL | Status: DC

## 2013-04-21 NOTE — Assessment & Plan Note (Signed)
Blood work today. Has appt with Dr. Hazle Quant for eye exam. Foot exam today consistent with some early neuropathy, but not bothersome to patient, declines med. Baseline RLE slightly edematous.

## 2013-04-21 NOTE — Assessment & Plan Note (Signed)
Prior on crestor.  Will do trial of lipitor 40mg  daily.  Will recheck 3 mo after starting lipitor.  Pt agrees with plan.

## 2013-04-21 NOTE — Assessment & Plan Note (Signed)
cymbalta recently decreased from 60 to 30mg  daily.  Pt noted worsening of depressed mood with this change. Will cross taper off cymbalta and onto zoloft for goal of 100mg  daily. See pt instructions. rtc 6 wk for recheck depression.  PHQ9 = 15/27, somewhat difficult to function

## 2013-04-21 NOTE — Assessment & Plan Note (Addendum)
Continue omeprazole 40mg  daily in am, 20-58min prior to meals. Start pepcid nightly. If not improved, consider change to another PPI (dexilant as nexium did not help in past) Refer to ENT for 18mo h/o hoarseness in setting of worsening GERD per pt preference.

## 2013-04-21 NOTE — Patient Instructions (Addendum)
Pass by Marion's office for ENT referral.  In interim, start pepcid complete every night along with omeprazole during the day. Blood work today.  We will continue to monitor feet - possibly early neuropathic changes. Start zoloft 25mg  daily for 1 week then increase to 50mg  daily for 1 week then increase to 100mg  daily (2 pills of 50mg  dose).  Continue cymbalta 30mg  for now.  May stop cymbalta after 2 weeks when you increase zoloft to 100mg . Start lipitor once you run out of crestor.  We will recheck cholesterol levels in 3-4 months after starting lipitor. Return in 6 weeks for mood follow up.

## 2013-04-21 NOTE — Assessment & Plan Note (Signed)
Recheck today. Lab Results  Component Value Date   CREATININE 1.3 10/04/2012

## 2013-04-21 NOTE — Progress Notes (Signed)
Subjective:    Patient ID: James Bell, male    DOB: 12-29-1930, 79 y.o.   MRN: [id number]  HPI CC: DM f/u  Dr. Jeanie Sewer presents today as Diabetes follow up.  CVA - speech therapist recommended ENT evaluation for hoarseness for the last 2 months.  Would like referral today.  Denies dysphagia, sore throat.  + acid in throat in am.  Needs to keep head of bed elevated or notes more trouble with reflux.  On omeprazole 40mg  daily, doesn't control sxs.  Nexium doesn't help as well.  Taking pepcid complete intermittently at night (once a week).  DM - foot exam due today.  Has upcoming appt with Dr. Alisa Graff.  Blood work today.  Checking fasting sugars and if doesn't feel well - running 110s.  No low sugars, no hypoglycemic sxs.  Some malaise present.  Mood disorder - prior on cymbalta 60mg  daily.  Had not tried generic equivalents in the past.  Prior wanted to trial taper off med to see how he did.  Now notes feeling more depressed on cymbalta 30mg , "like things aren't worth doing" - thinks will need daily antidepressant.  On diazepam 5mg  prn muscle relaxant.  HLD - recent change from crestor to lipitor.  Still taking left over crestor.  Seeing Dr. Patsi Sears for BPH with LUTS  Medications and allergies reviewed and updated in chart.  Past histories reviewed and updated if relevant as below. Patient Active Problem List   Diagnosis Date Noted  . ED (erectile dysfunction) 02/05/2013  . Depression   . Palpitations 06/26/2012  . HLD (hyperlipidemia)   . Restless leg syndrome   . GERD (gastroesophageal reflux disease)   . BPH with obstruction/lower urinary tract symptoms   . CVA (cerebrovascular accident) 03/11/2012  . Right hemiparesis 03/11/2012  . Type 2 diabetes, controlled, with neuropathy 03/11/2012  . OSA (obstructive sleep apnea) 03/11/2012  . Elevated serum creatinine 03/11/2012  . Migraines 03/11/2012   Past Medical History  Diagnosis Date  . HLD (hyperlipidemia)   .  Restless leg syndrome     well controlled on mirapex  . OSA on CPAP     severe, CPAP at 12, previously on nuvigil (Dr. Frances Furbish)  . Type 2 diabetes, controlled, with neuropathy 2011  . H/O hiatal hernia   . GERD (gastroesophageal reflux disease)     with sliding HH  . Migraines 03/11/12    now rare  . CVA (cerebral infarction) 03/07/12    slurred speech; right hemplegia  . Basal cell carcinoma of nose 2002    bridge of nose  . History of kidney problems 12/2010    lacerated kidney after fall  . BPH with obstruction/lower urinary tract symptoms     failed flomax, uroxatral, myrbetriq - sees urology Patsi Sears) pending videourodynamics  . Depression     mild, on cymbalta per prior PCP  . Palpitations 01/2012    holter WNL, rare PAC/PVCs  . Anemia   . Erectile dysfunction     per urology (prostaglandin injection)   Past Surgical History  Procedure Laterality Date  . Prostate surgery  02/2009    PVP (laser)  . Tonsillectomy and adenoidectomy  ~ 1945  . Hemorrhoid surgery  1991  . Cataract extraction w/ intraocular lens implant  11//2012 - 08/2011  . Cardiac catheterization  2004    normal  . Inguinal hernia repair  1981; 2002    left, then repaired  . Skin cancer excision  2002    bridge of  nose, BCC  . Colonoscopy  05/2011    normal per pt (Dr. Pearlean Brownie, [place], [place])  . Mri brain  04/2010    WNL  . Esophagogastroduodenoscopy  2006    duodenitis, medual HH, Grade 2 reflux, mild gastritis  . Colonoscopy  05/2011    no records received from Dr. Pearlean Brownie  . Vanice Sarah  2007    WNL   History  Substance Use Topics  . Smoking status: Former Smoker -- 10 years    Types: Pipe    Quit date: 09/18/1976  . Smokeless tobacco: Never Used     Comment: "used to smoke pipe when I was younger"  . Alcohol Use: 2.4 oz/week    2 Shots of liquor, 2 Glasses of wine per week     Comment: occasional   Family History  Problem Relation Age of Onset  . Stroke Father   . Lung cancer Father 23      smoker  . Stroke Brother   . Diabetes Brother 21  . Coronary artery disease Neg Hx   . Lung cancer Brother   . Alcoholism Sister   . Kidney disease Sister    Allergies  Allergen Reactions  . Lisinopril Cough  . Nexium (Esomeprazole) Other (See Comments)    Headache  . Niacin And Related Other (See Comments)    flushing   Current Outpatient Prescriptions on File Prior to Visit  Medication Sig Dispense Refill  . atorvastatin (LIPITOR) 40 MG tablet Take 1 tablet (40 mg total) by mouth daily.  90 tablet  3  . cetirizine (ZYRTEC) 10 MG tablet Take 10 mg by mouth as needed for allergies.      Marland Kitchen clopidogrel (PLAVIX) 75 MG tablet Take 1 tablet (75 mg total) by mouth daily.  90 tablet  3  . Coenzyme Q10 (COQ10) 150 MG CAPS Take 1 capsule by mouth 2 (two) times daily.      . fish oil-omega-3 fatty acids 1000 MG capsule Take 2 g by mouth 2 (two) times daily.      . Folic Acid-Vit B6-Vit B12 (FOLBEE) 2.5-25-1 MG TABS Take 1 tablet by mouth daily.  90 tablet  3  . insulin glargine (LANTUS) 100 UNIT/ML injection Inject 50 Units into the skin at bedtime.  45 mL  3  . Multiple Vitamin (MULTIVITAMIN) tablet Take 1 tablet by mouth daily.      . pramipexole (MIRAPEX) 1 MG tablet Take 1 tablet (1 mg total) by mouth at bedtime.  90 tablet  3  . SitaGLIPtin-MetFORMIN HCl (JANUMET XR) 50-1000 MG TB24 Take 1 tablet by mouth 2 (two) times daily before a meal.  180 tablet  3  . vitamin C (ASCORBIC ACID) 500 MG tablet Take 1,000 mg by mouth daily.       . propranolol (INDERAL) 20 MG tablet Take 1 tablet (20 mg total) by mouth at bedtime.  90 tablet  3   No current facility-administered medications on file prior to visit.     Review of Systems Per HPI    Objective:   Physical Exam  Nursing note and vitals reviewed. Constitutional: He appears well-developed and well-nourished. No distress.  HENT:  Head: Normocephalic and atraumatic.  Right Ear: External ear normal.  Left Ear: External ear normal.   Nose: Nose normal.  Mouth/Throat: Oropharynx is clear and moist. No oropharyngeal exudate.  Eyes: Conjunctivae and EOM are normal. Pupils are equal, round, and reactive to light. No scleral icterus.  Neck: Normal range of  motion. Neck supple. Carotid bruit is not present.  Cardiovascular: Normal rate, regular rhythm, normal heart sounds and intact distal pulses.   No murmur heard. Pulmonary/Chest: Effort normal and breath sounds normal. No respiratory distress. He has no wheezes. He has no rales.  Musculoskeletal: He exhibits edema (RLE tr pedal edema).  Diabetic foot exam: Normal inspection No skin breakdown No calluses  Normal DP/PT pulses Normal sensation to light touch and slightly diminished to monofilament bilaterally Nails normal RLE slightly swollen compared to left  Lymphadenopathy:    He has no cervical adenopathy.  Skin: Skin is warm and dry. No rash noted.  Psychiatric: He has a normal mood and affect.       Assessment & Plan:

## 2013-04-22 ENCOUNTER — Encounter: Payer: Self-pay | Admitting: *Deleted

## 2013-04-23 DIAGNOSIS — R490 Dysphonia: Secondary | ICD-10-CM | POA: Diagnosis not present

## 2013-04-23 DIAGNOSIS — K219 Gastro-esophageal reflux disease without esophagitis: Secondary | ICD-10-CM | POA: Diagnosis not present

## 2013-04-28 ENCOUNTER — Encounter: Payer: Self-pay | Admitting: Family Medicine

## 2013-05-01 NOTE — Telephone Encounter (Signed)
Approval letter received; [facility] notified and Dr Reece Agar already signed and will send for scanning.

## 2013-05-02 ENCOUNTER — Encounter: Payer: Self-pay | Admitting: Physical Medicine & Rehabilitation

## 2013-05-02 ENCOUNTER — Encounter: Payer: Medicare Other | Attending: Physical Medicine & Rehabilitation

## 2013-05-02 ENCOUNTER — Ambulatory Visit (HOSPITAL_BASED_OUTPATIENT_CLINIC_OR_DEPARTMENT_OTHER): Payer: Medicare Other | Admitting: Physical Medicine & Rehabilitation

## 2013-05-02 VITALS — BP 154/72 | HR 75 | Resp 14 | Ht 69.0 in | Wt 183.6 lb

## 2013-05-02 DIAGNOSIS — M79609 Pain in unspecified limb: Secondary | ICD-10-CM | POA: Insufficient documentation

## 2013-05-02 DIAGNOSIS — I69959 Hemiplegia and hemiparesis following unspecified cerebrovascular disease affecting unspecified side: Secondary | ICD-10-CM | POA: Diagnosis not present

## 2013-05-02 DIAGNOSIS — G8110 Spastic hemiplegia affecting unspecified side: Secondary | ICD-10-CM | POA: Diagnosis not present

## 2013-05-02 DIAGNOSIS — M25819 Other specified joint disorders, unspecified shoulder: Secondary | ICD-10-CM | POA: Insufficient documentation

## 2013-05-02 LAB — HM DIABETES EYE EXAM: HM Diabetic Eye Exam: NORMAL

## 2013-05-02 NOTE — Progress Notes (Signed)
Botox Injection for Right upper extremity spasticity using needle EMG guidance    Dilution: 50 Units/ml Indication: Severe spasticity which interferes with ADL,mobility and/or  hygiene and is unresponsive to medication management and other conservative care Informed consent was obtained after describing risks and benefits of the procedure with the patient. This includes bleeding, bruising, infection, excessive weakness, or medication side effects. A REMS form is on file and signed. Needle: 25 gauge 2 inch needle electrode Number of units per muscle Pectoralis0 Biceps0 FCR25 FCU0 FDS75 FDP50 BR25 FPL 25

## 2013-05-09 DIAGNOSIS — N318 Other neuromuscular dysfunction of bladder: Secondary | ICD-10-CM | POA: Diagnosis not present

## 2013-05-09 DIAGNOSIS — R351 Nocturia: Secondary | ICD-10-CM | POA: Diagnosis not present

## 2013-05-14 DIAGNOSIS — H5210 Myopia, unspecified eye: Secondary | ICD-10-CM | POA: Diagnosis not present

## 2013-05-14 DIAGNOSIS — E119 Type 2 diabetes mellitus without complications: Secondary | ICD-10-CM | POA: Diagnosis not present

## 2013-05-14 NOTE — Telephone Encounter (Signed)
LM for patient at home regarding his appt on 05/16/2013, explained we need to cancel and reschedule because he has not been provided with an auto-titrating CPAP yet.  Asked him to call me back.  Explained that orders will be sent today to [facility] which has the nearest location of [facility] and they they will arrange for auto-titration as requested by Dr. Frances Furbish.  Orders were originally sent to [facility] for auto-titration however they have let us know that they found out they could not provide for this patient because his current machine was obtained in 2011 from [facility] in [place] or [place].  He since moved and they were not aware of this.  They will need to provide the APAP and service his current machine which there is a question of it's functionality.  [facility] is this patient's DME provider  [facility] in [place] first and they told me there was a branch in [place]... [employer] and spoke to [place], [place] who is there every Wednesday.  She explained that if I send orders over today she can try to contact patient today to arrange for setup of APAP and will check his current machine to see if there is a problem.  She says that he has been receiving supplies on an auto-ship (to which address I am uncertain).    [facility] phone is [phone] and fax is [phone]

## 2013-05-16 ENCOUNTER — Ambulatory Visit: Payer: Medicare Other | Admitting: Neurology

## 2013-05-16 NOTE — Telephone Encounter (Signed)
Called and LM for pt today, home phone would not take my message for some reason.  Called mobile and LM for pt explaining my call from 05/14/2013 and that I was checking to ensure he received the message.  Asked if he would call me to let me know.  I also LM on his daughter's number Lurena Joiner) which is listed as his POA.  I have been unable to verify that patient received my message about his cancelled appointment.  Appt is still on the schedule at this time.

## 2013-05-19 ENCOUNTER — Encounter: Payer: Self-pay | Admitting: Physical Medicine & Rehabilitation

## 2013-05-19 DIAGNOSIS — R279 Unspecified lack of coordination: Secondary | ICD-10-CM | POA: Diagnosis not present

## 2013-05-19 DIAGNOSIS — I69959 Hemiplegia and hemiparesis following unspecified cerebrovascular disease affecting unspecified side: Secondary | ICD-10-CM | POA: Diagnosis not present

## 2013-05-19 DIAGNOSIS — M6281 Muscle weakness (generalized): Secondary | ICD-10-CM | POA: Diagnosis not present

## 2013-05-19 DIAGNOSIS — IMO0001 Reserved for inherently not codable concepts without codable children: Secondary | ICD-10-CM | POA: Diagnosis not present

## 2013-05-19 DIAGNOSIS — M25519 Pain in unspecified shoulder: Secondary | ICD-10-CM | POA: Diagnosis not present

## 2013-05-28 ENCOUNTER — Telehealth: Payer: Self-pay

## 2013-05-28 NOTE — Telephone Encounter (Signed)
[facility] with [facility] said needs drug therapy clarification for Cymbalta; [facility] pharmacist said pt said he was to have Cymbalta 30 mg to taper off med; [facility] has no rx for Cymbalta 30 mg.Please advise. Ref# [id number]. Request cb by 05/30/13.

## 2013-05-29 MED ORDER — DULOXETINE HCL 30 MG PO CPEP: 30.0000 mg | ORAL_CAPSULE | Freq: Every day | ORAL | Status: DC

## 2013-05-29 NOTE — Telephone Encounter (Signed)
Plan was to stop cymbalta after he was stable on full dose of zoloft 100mg .  plz verify plan with patient (see my last patient instructions) then notify pharmacy.

## 2013-05-29 NOTE — Telephone Encounter (Signed)
Will send in cymbalta 30mg  daily - and further discuss at office visit next week. [facility]d [facility] for pt to choose where to fill.

## 2013-05-29 NOTE — Telephone Encounter (Signed)
Spoke with patient and he said he tried the zoloft and didn't like how it made him feel. He said it made him feel bad, but could describe exactly how it made him feel. So, he stayed on the Cymbalta 60 mg 1 QD and quit taking the zoloft on his own. Do you want to send in more cymbalta for patient?

## 2013-05-30 ENCOUNTER — Ambulatory Visit: Payer: Medicare Other | Admitting: Physical Medicine & Rehabilitation

## 2013-06-02 ENCOUNTER — Ambulatory Visit (INDEPENDENT_AMBULATORY_CARE_PROVIDER_SITE_OTHER): Payer: Medicare Other | Admitting: Family Medicine

## 2013-06-02 ENCOUNTER — Encounter: Payer: Self-pay | Admitting: Family Medicine

## 2013-06-02 VITALS — BP 128/70 | HR 92 | Temp 98.0°F | Ht 68.0 in | Wt 183.8 lb

## 2013-06-02 DIAGNOSIS — F329 Major depressive disorder, single episode, unspecified: Secondary | ICD-10-CM

## 2013-06-02 DIAGNOSIS — G8191 Hemiplegia, unspecified affecting right dominant side: Secondary | ICD-10-CM

## 2013-06-02 DIAGNOSIS — Z23 Encounter for immunization: Secondary | ICD-10-CM | POA: Diagnosis not present

## 2013-06-02 DIAGNOSIS — R002 Palpitations: Secondary | ICD-10-CM | POA: Diagnosis not present

## 2013-06-02 DIAGNOSIS — E785 Hyperlipidemia, unspecified: Secondary | ICD-10-CM

## 2013-06-02 DIAGNOSIS — F32A Depression, unspecified: Secondary | ICD-10-CM

## 2013-06-02 DIAGNOSIS — K219 Gastro-esophageal reflux disease without esophagitis: Secondary | ICD-10-CM | POA: Diagnosis not present

## 2013-06-02 DIAGNOSIS — G8190 Hemiplegia, unspecified affecting unspecified side: Secondary | ICD-10-CM

## 2013-06-02 MED ORDER — METOPROLOL TARTRATE 25 MG PO TABS: 25.0000 mg | ORAL_TABLET | Freq: Two times a day (BID) | ORAL | Status: DC | PRN

## 2013-06-02 MED ORDER — PANTOPRAZOLE SODIUM 40 MG PO TBEC: 40.0000 mg | DELAYED_RELEASE_TABLET | Freq: Two times a day (BID) | ORAL | Status: DC | PRN

## 2013-06-02 MED ORDER — DULOXETINE HCL 60 MG PO CPEP: 60.0000 mg | ORAL_CAPSULE | Freq: Every day | ORAL | Status: DC

## 2013-06-02 NOTE — Addendum Note (Signed)
Addended by: Janalyn Harder on: 06/02/2013 02:44 PM   Modules accepted: Orders

## 2013-06-02 NOTE — Assessment & Plan Note (Signed)
Improved with omeprazole 40mg  bid.  However, given concern for plavix interaction will switch to protonix.  I have sent in 1 mo supply to local pharmacy, if tolerated well, will send [facility]mo supply [facility].  Pt agrees with plan.

## 2013-06-02 NOTE — Assessment & Plan Note (Signed)
Continue to f/u with [facility] - appreciate their care of pt.

## 2013-06-02 NOTE — Assessment & Plan Note (Addendum)
Desires to continue cymbalta at 60mg  daily.  [facility]  Discussed insurance may still not approve but pt and I both feel that staying on cymbalta at current dose is healthiest thing for now.

## 2013-06-02 NOTE — Assessment & Plan Note (Signed)
Has started lipitor - will be due for FLP next visit.

## 2013-06-02 NOTE — Assessment & Plan Note (Signed)
May use metoprolol prn elevated heart rate.  Pt feels better when HR <90.  Discussed monitoring for hypotension.

## 2013-06-02 NOTE — Progress Notes (Signed)
  Subjective:    Patient ID: James Bell, male    DOB: Jul 04, 1931, 79 y.o.   MRN: [id number]  HPI CC: 6 wk f/u mood  Mood disorder - prior on cymbalta 60mg  daily per prior PCP.  Had not tried generic equivalents in the past.  Prior wanted to trial taper off med to see how he did. Felt more depressed on cymbalta 30mg , "like things aren't worth doing" - so we decided to try to cross taper onto zoloft.  Didn't feel well with either taper off cymbalta or with commencement of zoloft - malaise, and worsened depression.  Back on cymbalta 60mg  daily.  Doesn't want to try other antidepressant.  GERD - saw Dr. Lazarus Salines ENT.  Thought hoarseness due to GERD worse at night.  Recommended increasing omeprazole 40mg  once daily - working well.  Hasn't needed pepcid with increased dose of omeprazole.  Has been on nexium in past which caused headache.  Now on omeprazole which is working well, but concern for interaction with plavix.    Worried about elevated pulse rate - was taking propranolol but doesn't like how it makes him feel.  HR running 90s at home.   Lab Results  Component Value Date   HGBA1C 6.8* 04/21/2013    Past Medical History  Diagnosis Date  . HLD (hyperlipidemia)   . Restless leg syndrome     well controlled on mirapex  . OSA on CPAP     severe, CPAP at 12, previously on nuvigil (Dr. Frances Furbish)  . Type 2 diabetes, controlled, with neuropathy 2011  . H/O hiatal hernia   . GERD (gastroesophageal reflux disease)     with sliding HH  . Migraines 03/11/12    now rare  . CVA (cerebral infarction) 03/07/12    slurred speech; right hemplegia, left handed  . Basal cell carcinoma of nose 2002    bridge of nose  . History of kidney problems 12/2010    lacerated kidney after fall  . BPH with obstruction/lower urinary tract symptoms     failed flomax, uroxatral, myrbetriq - sees urology Patsi Sears) pending videourodynamics  . Depression     mild, on cymbalta per prior PCP  . Palpitations  01/2012    holter WNL, rare PAC/PVCs  . Anemia   . Erectile dysfunction     per urology (prostaglandin injection)     Review of Systems Per HPI    Objective:   Physical Exam  Nursing note and vitals reviewed. Constitutional: He appears well-developed and well-nourished. No distress.  HENT:  Head: Normocephalic and atraumatic.  Mouth/Throat: Oropharynx is clear and moist. No oropharyngeal exudate.  Eyes: Conjunctivae and EOM are normal. Pupils are equal, round, and reactive to light. No scleral icterus.  Neck: Normal range of motion. Neck supple. Carotid bruit is not present. No thyromegaly present.  Cardiovascular: Normal rate, regular rhythm, normal heart sounds and intact distal pulses.   No murmur heard. Pulmonary/Chest: Effort normal and breath sounds normal. No respiratory distress. He has no wheezes. He has no rales.  Musculoskeletal: He exhibits no edema.  Lymphadenopathy:    He has no cervical adenopathy.  Neurological:  R hemiplegia Brace on right foot       Assessment & Plan:

## 2013-06-02 NOTE — Patient Instructions (Addendum)
Flu shot today. Stop propranolol.  May try metoprolol 25mg  as needed for faster heart rate. Stop omeprazole (due to plavix interaction).  Start protonix 40mg  once to twice daily for GERD. I will fill out PA for cymbalta to see if it's approved Return to see me in 5-6 months for medicare wellness exam.

## 2013-06-05 ENCOUNTER — Encounter: Payer: Medicare Other | Attending: Physical Medicine & Rehabilitation

## 2013-06-05 ENCOUNTER — Encounter: Payer: Self-pay | Admitting: Physical Medicine & Rehabilitation

## 2013-06-05 ENCOUNTER — Ambulatory Visit (HOSPITAL_BASED_OUTPATIENT_CLINIC_OR_DEPARTMENT_OTHER): Payer: Medicare Other | Admitting: Physical Medicine & Rehabilitation

## 2013-06-05 VITALS — BP 152/76 | HR 88 | Resp 14 | Ht 68.0 in | Wt 185.4 lb

## 2013-06-05 DIAGNOSIS — M79609 Pain in unspecified limb: Secondary | ICD-10-CM | POA: Insufficient documentation

## 2013-06-05 DIAGNOSIS — M20039 Swan-neck deformity of unspecified finger(s): Secondary | ICD-10-CM | POA: Diagnosis not present

## 2013-06-05 DIAGNOSIS — G8110 Spastic hemiplegia affecting unspecified side: Secondary | ICD-10-CM

## 2013-06-05 DIAGNOSIS — I69959 Hemiplegia and hemiparesis following unspecified cerebrovascular disease affecting unspecified side: Secondary | ICD-10-CM | POA: Insufficient documentation

## 2013-06-05 DIAGNOSIS — M25819 Other specified joint disorders, unspecified shoulder: Secondary | ICD-10-CM | POA: Insufficient documentation

## 2013-06-05 DIAGNOSIS — M20031 Swan-neck deformity of right finger(s): Secondary | ICD-10-CM

## 2013-06-05 NOTE — Progress Notes (Signed)
Subjective:    Patient ID: James Bell, male    DOB: 1931/01/01, 79 y.o.   MRN: [id number]  HPI  Botox right upper extremity performed 05/02/2013.                 FCR25  FCU0  FDS75  FDP50  BR25  FPL 25 Total of 200 units injected        No post procedure complications . Has been able to maintain pinch. Still has right third finger extension. The other fingers can be extended now more easily. Pain Inventory Average Pain 0 Pain Right Now 0 My pain is no pain  In the last 24 hours, has pain interfered with the following? General activity 0 Relation with others 0 Enjoyment of life 0 What TIME of day is your pain at its worst? no pain Sleep (in general) Fair  Pain is worse with: no pain Pain improves with: no pain Relief from Meds: no pain  Mobility walk with assistance ability to climb steps?  no do you drive?  yes use a wheelchair transfers alone  Function retired I need assistance with the following:  household duties and shopping  Neuro/Psych weakness tingling loss of taste or smell  Prior Studies Any changes since last visit?  no  Physicians involved in your care Any changes since last visit?  no   Family History  Problem Relation Age of Onset  . Stroke Father   . Lung cancer Father 64     smoker  . Stroke Brother   . Diabetes Brother 64  . Coronary artery disease Neg Hx   . Lung cancer Brother   . Alcoholism Sister   . Kidney disease Sister    History   Social History  . Marital Status: Unknown    Spouse Name: N/A    Number of Children: 2  . Years of Education: N/A   Occupational History  . retired    Social History Main Topics  . Smoking status: Former Smoker -- 10 years    Types: Pipe    Quit date: 09/18/1976  . Smokeless tobacco: Never Used     Comment: "used to smoke pipe when I was younger"  . Alcohol Use: 2.4 oz/week    2 Shots of liquor, 2 Glasses of wine per week     Comment: occasional  . Drug Use: No  . Sexual  Activity: Yes   Other Topics Concern  . None   Social History Narrative   Caffeine: 1 cup coffee/day   Divorced   Occupation: ophthalmologist   Edu: MD   Activity: rehab s/p CVA   Diet: some water, fruits/vegetables daily   POA - Tyrone Schimke (Daughter)   Past Surgical History  Procedure Laterality Date  . Prostate surgery  02/2009    PVP (laser)  . Tonsillectomy and adenoidectomy  ~ 1945  . Hemorrhoid surgery  1991  . Cataract extraction w/ intraocular lens implant  11//2012 - 08/2011  . Cardiac catheterization  2004    normal  . Inguinal hernia repair  1981; 2002    left, then repaired  . Skin cancer excision  2002    bridge of nose, BCC  . Colonoscopy  05/2011    normal per pt (Dr. Pearlean Brownie, [place], [place])  . Mri brain  04/2010    WNL  . Esophagogastroduodenoscopy  2006    duodenitis, medual HH, Grade 2 reflux, mild gastritis  . Colonoscopy  05/2011    no records  received from Dr. Pearlean Brownie  . Abi  2007    WNL   Past Medical History  Diagnosis Date  . HLD (hyperlipidemia)   . Restless leg syndrome     well controlled on mirapex  . OSA on CPAP     severe, CPAP at 12, previously on nuvigil (Dr. Frances Furbish)  . Type 2 diabetes, controlled, with neuropathy 2011  . H/O hiatal hernia   . GERD (gastroesophageal reflux disease)     with sliding HH  . Migraines 03/11/12    now rare  . CVA (cerebral infarction) 03/07/12    slurred speech; right hemplegia, left handed  . Basal cell carcinoma of nose 2002    bridge of nose  . History of kidney problems 12/2010    lacerated kidney after fall  . BPH with obstruction/lower urinary tract symptoms     failed flomax, uroxatral, myrbetriq - sees urology Patsi Sears) pending videourodynamics  . Depression     mild, on cymbalta per prior PCP  . Palpitations 01/2012    holter WNL, rare PAC/PVCs  . Anemia   . Erectile dysfunction     per urology (prostaglandin injection)   BP 152/76  Pulse 88  Resp 14  Ht 5\' 8"  (1.727 m)  Wt 185  lb 6.4 oz (84.097 kg)  BMI 28.2 kg/m2  SpO2 96%   Review of Systems  Constitutional:       Loss of taste of smell  Respiratory: Positive for apnea.   Neurological: Positive for weakness.       Tingling  All other systems reviewed and are negative.       Objective:   Physical Exam  Nursing note and vitals reviewed. Constitutional: He is oriented to person, place, and time. He appears well-developed and well-nourished.  Neurological: He is alert and oriented to person, place, and time.  Reflex Scores:      Tricep reflexes are 3+ on the right side.      Bicep reflexes are 3+ on the right side.      Brachioradialis reflexes are 3+ on the right side. No pain with wrist finger or elbow range of motion   Spasticity Ashworth 2 at the finger flexors Ashworth 0 at the wrist flexors  Swan neck deformity right third and right fourth digits. Unable to flex right PIP #3 Mood and affect are appropriate    Assessment & Plan:  01. Right spastic hemiplegia secondary to CVA. Overall improvement with wrist and finger flexors spasticity after Botox injection. Still has biceps spasticity. We discussed treatment options including musculocutaneous nerve block versus reassessment and possible inclusion of biceps when we repeat Botox injection. Return to clinic in 6 weeks to reassess 2. 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 Will refer to orthopedic hand specialist. This does not appear to be a spasticity related issue

## 2013-06-09 DIAGNOSIS — E236 Other disorders of pituitary gland: Secondary | ICD-10-CM | POA: Diagnosis not present

## 2013-06-09 DIAGNOSIS — B353 Tinea pedis: Secondary | ICD-10-CM | POA: Diagnosis not present

## 2013-06-09 DIAGNOSIS — E785 Hyperlipidemia, unspecified: Secondary | ICD-10-CM | POA: Diagnosis not present

## 2013-06-09 DIAGNOSIS — M899 Disorder of bone, unspecified: Secondary | ICD-10-CM | POA: Diagnosis not present

## 2013-06-09 DIAGNOSIS — E1149 Type 2 diabetes mellitus with other diabetic neurological complication: Secondary | ICD-10-CM | POA: Diagnosis not present

## 2013-06-09 DIAGNOSIS — E786 Lipoprotein deficiency: Secondary | ICD-10-CM | POA: Diagnosis not present

## 2013-06-11 ENCOUNTER — Telehealth: Payer: Self-pay | Admitting: Neurology

## 2013-06-11 NOTE — Telephone Encounter (Signed)
ERROR

## 2013-06-18 ENCOUNTER — Encounter: Payer: Self-pay | Admitting: Physical Medicine & Rehabilitation

## 2013-06-18 DIAGNOSIS — I69959 Hemiplegia and hemiparesis following unspecified cerebrovascular disease affecting unspecified side: Secondary | ICD-10-CM | POA: Diagnosis not present

## 2013-06-18 DIAGNOSIS — M6281 Muscle weakness (generalized): Secondary | ICD-10-CM | POA: Diagnosis not present

## 2013-06-18 DIAGNOSIS — IMO0001 Reserved for inherently not codable concepts without codable children: Secondary | ICD-10-CM | POA: Diagnosis not present

## 2013-06-18 DIAGNOSIS — R279 Unspecified lack of coordination: Secondary | ICD-10-CM | POA: Diagnosis not present

## 2013-06-18 DIAGNOSIS — M25519 Pain in unspecified shoulder: Secondary | ICD-10-CM | POA: Diagnosis not present

## 2013-07-03 DIAGNOSIS — M20039 Swan-neck deformity of unspecified finger(s): Secondary | ICD-10-CM | POA: Diagnosis not present

## 2013-07-04 ENCOUNTER — Encounter: Payer: Self-pay | Admitting: Neurology

## 2013-07-07 ENCOUNTER — Encounter: Payer: Self-pay | Admitting: Neurology

## 2013-07-07 ENCOUNTER — Ambulatory Visit (INDEPENDENT_AMBULATORY_CARE_PROVIDER_SITE_OTHER): Payer: Medicare Other | Admitting: Neurology

## 2013-07-07 VITALS — BP 125/69 | HR 73 | Ht 68.0 in | Wt 189.0 lb

## 2013-07-07 DIAGNOSIS — I6350 Cerebral infarction due to unspecified occlusion or stenosis of unspecified cerebral artery: Secondary | ICD-10-CM | POA: Diagnosis not present

## 2013-07-07 NOTE — Patient Instructions (Signed)
Continue Plavix for stroke prevention and strict control of hypertension with blood pressure goal below 120/80, lipids with LDL cholesterol goal below 70 mg percent and diabetes with hemoglobin A1c goal below 6.5%. Check followup carotid ultrasound study. Return for followup in 6 months with James Fife, NP or call earlier if necessary.

## 2013-07-07 NOTE — Progress Notes (Signed)
[facility] [address] [place]. [place] [address] [phone]       OFFICE FOLLOW-UP NOTE  Mr. ISSAAC Bell Date of Birth:  August 29, 1931 Medical Record Number:  [id number]   HPI: 79 year male left brain infarct in June 2013 secondary to small vessel disease with vascular risk factors of hypertension, diabetes, hyperlipidemia and age.  Update 07/07/13 He returns for followup after last visit on 06/07/12. He continues to do well without recurrence stroke or TIA symptoms. He is continuing last physical and occupational therapy and is now able to walk with a 4 pronged. cane. He has regained some strength in his right shoulder and elbow but still has significant weakness of the grip and intrinsic hand muscles. He questioned the gym twice per week. He is still getting outpatient occupational therapy in [place] twice per week but this will be finishing this month. He is walking with her here for her right foot. He recently saw his endocrinologist Dr.Kerr who was happy with his diabetes control and last hemoglobin A1c was 5.6. His blood pressure is well controlled and today it is 125/69 office. His last lipid profile in June suggested elevated triglycerides and low HDL and Lipitor was added. A follow up profiles pending. He is no new complaints today except mild fatigue.  ROS:   14 system review of systems is positive for fatigue, snoring only  PMH:  Past Medical History  Diagnosis Date  . HLD (hyperlipidemia)   . Restless leg syndrome     well controlled on mirapex  . OSA on CPAP     severe, CPAP at 10, previously on nuvigil (Dr. Frances Furbish)  . Type 2 diabetes, controlled, with neuropathy 2011  . H/O hiatal hernia   . GERD (gastroesophageal reflux disease)     with sliding HH  . Migraines 03/11/12    now rare  . CVA (cerebral infarction) 03/07/12    slurred speech; right hemplegia, left handed  . Basal cell carcinoma of nose 2002    bridge of nose  . History of kidney  problems 12/2010    lacerated kidney after fall  . BPH with obstruction/lower urinary tract symptoms     failed flomax, uroxatral, myrbetriq - sees urology Patsi Sears) pending videourodynamics  . Depression     mild, on cymbalta per prior PCP  . Palpitations 01/2012    holter WNL, rare PAC/PVCs  . Anemia   . Erectile dysfunction     per urology (prostaglandin injection)    Social History:  History   Social History  . Marital Status: Unknown    Spouse Name: N/A    Number of Children: 2  . Years of Education: MD   Occupational History  . retired   .     Social History Main Topics  . Smoking status: Former Smoker -- 10 years    Types: Pipe    Quit date: 09/18/1976  . Smokeless tobacco: Never Used     Comment: "used to smoke pipe when I was younger"  . Alcohol Use: 2.4 oz/week    2 Shots of liquor, 2 Glasses of wine per week     Comment: occasional  . Drug Use: No  . Sexual Activity: Yes   Other Topics Concern  . Not on file   Social History Narrative   Caffeine: 1 cup coffee/day   Divorced   Occupation: ophthalmologist   Edu: MD   Activity: rehab s/p CVA   Diet: some water, fruits/vegetables daily  POA - Tyrone Schimke (Daughter)    Medications:   Current Outpatient Prescriptions on File Prior to Visit  Medication Sig Dispense Refill  . atorvastatin (LIPITOR) 40 MG tablet Take 1 tablet (40 mg total) by mouth daily.  90 tablet  3  . cetirizine (ZYRTEC) 10 MG tablet Take 10 mg by mouth as needed for allergies.      Marland Kitchen clopidogrel (PLAVIX) 75 MG tablet Take 1 tablet (75 mg total) by mouth daily.  90 tablet  3  . Coenzyme Q10 (COQ10) 150 MG CAPS Take 1 capsule by mouth 2 (two) times daily.      . diazepam (VALIUM) 5 MG tablet Take 1 tablet (5 mg total) by mouth every 8 (eight) hours as needed for anxiety.  30 tablet  0  . DULoxetine (CYMBALTA) 60 MG capsule Take 1 capsule (60 mg total) by mouth daily.  90 capsule  3  . fish oil-omega-3 fatty acids 1000 MG capsule  Take 2 g by mouth 2 (two) times daily.      . Folic Acid-Vit B6-Vit B12 (FOLBEE) 2.5-25-1 MG TABS Take 1 tablet by mouth daily.  90 tablet  3  . insulin glargine (LANTUS) 100 UNIT/ML injection Inject 50 Units into the skin at bedtime.  45 mL  3  . metoprolol tartrate (LOPRESSOR) 25 MG tablet Take 1 tablet (25 mg total) by mouth 2 (two) times daily as needed (rapid heart rate).  60 tablet  3  . Multiple Vitamin (MULTIVITAMIN) tablet Take 1 tablet by mouth daily.      . pantoprazole (PROTONIX) 40 MG tablet Take 1 tablet (40 mg total) by mouth 2 (two) times daily as needed.  60 tablet  3  . pramipexole (MIRAPEX) 1 MG tablet Take 1 tablet (1 mg total) by mouth at bedtime.  90 tablet  3  . SitaGLIPtin-MetFORMIN HCl (JANUMET XR) 50-1000 MG TB24 Take 1 tablet by mouth 2 (two) times daily before a meal.  180 tablet  3  . vitamin C (ASCORBIC ACID) 500 MG tablet Take 1,000 mg by mouth daily.        No current facility-administered medications on file prior to visit.    Allergies:   Allergies  Allergen Reactions  . Lisinopril Cough  . Nexium [Esomeprazole] Other (See Comments)    Headache  . Niacin And Related Other (See Comments)    flushing    Physical Exam General: well developed, well nourished, seated, in no evident distress Head: head normocephalic and atraumatic. Orohparynx benign Neck: supple with no carotid or supraclavicular bruits Cardiovascular: regular rate and rhythm, no murmurs Musculoskeletal: no deformity Skin:  no rash/petichiae Vascular:  Normal pulses all extremities Filed Vitals:   07/07/13 1417  BP: 125/69  Pulse: 73    Neurologic Exam Mental Status: Awake and fully alert. Oriented to place and time. Recent and remote memory intact. Attention span, concentration and fund of knowledge appropriate. Mood and affect appropriate.  Cranial Nerves: Fundoscopic exam reveals sharp disc margins. Pupils equal, briskly reactive to light. Extraocular movements full without  nystagmus. Visual fields full to confrontation. Hearing intact. Facial sensation intact. Face, tongue, palate moves normally and symmetrically.  Motor:  3/5 strength proximally and right upper extremity and moderate weakness of the right grip and intrinsic hand muscles..4/5 strength in the right hip flexors. A right footdrop with 1/5 right ankle dorsiflexor strength only. Increased tone on the right side  Sensory.: intact to touch and pinprick and vibratory sensation.  Coordination: Rapid alternating movements  normal in all extremities. Finger-to-nose and heel-to-shin performed accurately bilaterally. Gait and Station: Arises from chair without difficulty. Stance is normal. Gait demonstrates normal stride length and balance . Able to heel, toe and tandem walk without difficulty.  Reflexes: 1+ and symmetric. Toes downgoing.       ASSESSMENT: 79 year male left brain infarct in June 2013 secondary to small vessel disease with vascular risk factors of hypertension, diabetes, hyperlipidemia and age.    PLAN: Continue Plavix for stroke prevention and strict control of hypertension with blood pressure goal below 120/80, lipids with LDL cholesterol goal below 70 mg percent and diabetes with hemoglobin A1c goal below 6.5%. Check followup carotid ultrasound study. Return for followup in 6 months with Larita Fife, NP or call earlier if necessary.

## 2013-07-08 DIAGNOSIS — N529 Male erectile dysfunction, unspecified: Secondary | ICD-10-CM | POA: Diagnosis not present

## 2013-07-08 DIAGNOSIS — E1142 Type 2 diabetes mellitus with diabetic polyneuropathy: Secondary | ICD-10-CM | POA: Diagnosis not present

## 2013-07-08 DIAGNOSIS — N401 Enlarged prostate with lower urinary tract symptoms: Secondary | ICD-10-CM | POA: Diagnosis not present

## 2013-07-14 ENCOUNTER — Encounter: Payer: Medicare Other | Attending: Physical Medicine & Rehabilitation

## 2013-07-14 ENCOUNTER — Ambulatory Visit (HOSPITAL_BASED_OUTPATIENT_CLINIC_OR_DEPARTMENT_OTHER): Payer: Medicare Other | Admitting: Physical Medicine & Rehabilitation

## 2013-07-14 ENCOUNTER — Encounter: Payer: Self-pay | Admitting: Physical Medicine & Rehabilitation

## 2013-07-14 ENCOUNTER — Other Ambulatory Visit: Payer: Self-pay | Admitting: Urology

## 2013-07-14 VITALS — BP 130/62 | HR 73 | Resp 14 | Ht 68.0 in | Wt 194.2 lb

## 2013-07-14 DIAGNOSIS — G8110 Spastic hemiplegia affecting unspecified side: Secondary | ICD-10-CM

## 2013-07-14 DIAGNOSIS — M79609 Pain in unspecified limb: Secondary | ICD-10-CM | POA: Insufficient documentation

## 2013-07-14 DIAGNOSIS — I69959 Hemiplegia and hemiparesis following unspecified cerebrovascular disease affecting unspecified side: Secondary | ICD-10-CM | POA: Insufficient documentation

## 2013-07-14 DIAGNOSIS — M25819 Other specified joint disorders, unspecified shoulder: Secondary | ICD-10-CM | POA: Insufficient documentation

## 2013-07-14 NOTE — Progress Notes (Signed)
Subjective:    Patient ID: James Bell, male    DOB: 1931/07/25, 79 y.o.   MRN: [id number]  HPI 08/09/2012 Botox doses  Pectoralis0 Biceps0 FCR50 FCU25 FDS50 FDP50 FPL0 PT25  Botox right upper extremity performed 05/02/2013. FCR25  FCU0  FDS75  FDP50  BR25  FPL 25  Total of 200 units injected   Patient feels that the 08/09/2012 Botox affect was better than the 05/02/2013 injection.  Finishing OT, patient noticing increased ability to touch face with right hand.  Pain Inventory Average Pain 0 Pain Right Now 0 My pain is no pain  In the last 24 hours, has pain interfered with the following? General activity 0 Relation with others 0 Enjoyment of life 0 What TIME of day is your pain at its worst? 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  no  Physicians involved in your care Any changes since last visit?  no   Family History  Problem Relation Age of Onset  . Stroke Father   . Lung cancer Father 64     smoker  . Stroke Brother   . Diabetes Brother 52  . Coronary artery disease Neg Hx   . Lung cancer Brother   . Alcoholism Sister   . Kidney disease Sister    History   Social History  . Marital Status: Unknown    Spouse Name: N/A    Number of Children: 2  . Years of Education: MD   Occupational History  . retired   .     Social History Main Topics  . Smoking status: Former Smoker -- 10 years    Types: Pipe    Quit date: 09/18/1976  . Smokeless tobacco: Never Used     Comment: "used to smoke pipe when I was younger"  . Alcohol Use: 2.4 oz/week    2 Shots of liquor, 2 Glasses of wine per week     Comment: occasional  . Drug Use: No  . Sexual Activity: Yes   Other Topics Concern  .  None   Social History Narrative   Caffeine: 1 cup coffee/day   Divorced   Occupation: ophthalmologist   Edu: MD   Activity: rehab s/p CVA   Diet: some water, fruits/vegetables daily   POA - Tyrone Schimke (Daughter)   Past Surgical History  Procedure Laterality Date  . Prostate surgery  02/2009    PVP (laser)  . Tonsillectomy and adenoidectomy  ~ 1945  . Hemorrhoid surgery  1991  . Cataract extraction w/ intraocular lens implant  11//2012 - 08/2011  . Cardiac catheterization  2004    normal  . Inguinal hernia repair  1981; 2002    left, then repaired  . Skin cancer excision  2002    bridge of nose, BCC  . Colonoscopy  05/2011    normal per pt (Dr. Pearlean Brownie, [place], [place])  . Mri brain  04/2010    WNL  . Esophagogastroduodenoscopy  2006    duodenitis, medual HH, Grade 2 reflux, mild gastritis  . Colonoscopy  05/2011    no records received from Dr. Pearlean Brownie  . Abi  2007    WNL   Past Medical History  Diagnosis Date  .  HLD (hyperlipidemia)   . Restless leg syndrome     well controlled on mirapex  . OSA on CPAP     severe, CPAP at 12, previously on nuvigil (Dr. Frances Furbish)  . Type 2 diabetes, controlled, with neuropathy 2011  . H/O hiatal hernia   . GERD (gastroesophageal reflux disease)     with sliding HH  . Migraines 03/11/12    now rare  . CVA (cerebral infarction) 03/07/12    slurred speech; right hemplegia, left handed  . Basal cell carcinoma of nose 2002    bridge of nose  . History of kidney problems 12/2010    lacerated kidney after fall  . BPH with obstruction/lower urinary tract symptoms     failed flomax, uroxatral, myrbetriq - sees urology Patsi Sears) pending videourodynamics  . Depression     mild, on cymbalta per prior PCP  . Palpitations 01/2012    holter WNL, rare PAC/PVCs  . Anemia   . Erectile dysfunction     per urology (prostaglandin injection)   BP 130/62  Pulse 73  Resp 14  Ht 5\' 8"  (1.727 m)  Wt 194 lb 3.2 oz (88.089 kg)  BMI 29.54 kg/m2   SpO2 96%    Review of Systems  All other systems reviewed and are negative.       Objective:   Physical Exam  Lumbrical 2 through 5 with Ashworth grade 2-3 spasticity FPL Ashworth 0 FDS Ashworth 0 FDP Ashworth 0 Biceps Ashworth 2 Wrist flexors Ashworth 1 Now able to reach behind back to touch buttocks area on the right side Also able to reach with right upper extremity to  touch ear    Assessment & Plan:  1. Status post Botox injection for right spastic hemiplegia following stroke. Discussed the 2 treatments in the dosing. Total dose was equal at 200 units. Distribution was different.  Based on examination today as well as looking back at the 2 injections done previously, recommend following dosages FPL 25 units FDS 50 units FDP 50 units FCR 25 units FCU 25 units Lumbricals 2,3,4 total 25 units  Continue OT to work on upper extremity active range of motion  Turn a clinic 6 weeks assess for possible Botox reinjection

## 2013-07-14 NOTE — Patient Instructions (Signed)
Continue OT We will reassess for possible Botox reinection at next visit

## 2013-07-15 ENCOUNTER — Encounter: Payer: Self-pay | Admitting: *Deleted

## 2013-07-15 ENCOUNTER — Telehealth: Payer: Self-pay | Admitting: *Deleted

## 2013-07-15 NOTE — Telephone Encounter (Signed)
Pam calling regards pt to have procedure on 07-24-13 (urethral dilation and then possible transurethral incision of the prostate).  Asking about holding plavix for 7 days prior procedure.  Please advise.

## 2013-07-15 NOTE — Telephone Encounter (Signed)
Ok to do with a small but acceptable risk of periprocedural stroke

## 2013-07-15 NOTE — Progress Notes (Signed)
Need orders in EPIC.  Surgery scheduled for 07/24/13.  Thank You.

## 2013-07-16 ENCOUNTER — Other Ambulatory Visit: Payer: Medicare Other

## 2013-07-16 NOTE — Progress Notes (Signed)
Need orders in EPIc.  Surgery scheduled for 07/24/13  Preop on 07/22/13 at 1000.  Thank You.

## 2013-07-17 ENCOUNTER — Encounter (HOSPITAL_COMMUNITY): Payer: Self-pay | Admitting: Pharmacy Technician

## 2013-07-21 ENCOUNTER — Other Ambulatory Visit (HOSPITAL_COMMUNITY): Payer: Self-pay | Admitting: *Deleted

## 2013-07-22 ENCOUNTER — Encounter (HOSPITAL_COMMUNITY)
Admission: RE | Admit: 2013-07-22 | Discharge: 2013-07-22 | Disposition: A | Discharge status: Home / Self Care | Payer: Medicare Other | Source: Ambulatory Visit | Attending: Urology | Admitting: Urology

## 2013-07-22 ENCOUNTER — Encounter (HOSPITAL_COMMUNITY): Payer: Self-pay

## 2013-07-22 DIAGNOSIS — Z8673 Personal history of transient ischemic attack (TIA), and cerebral infarction without residual deficits: Secondary | ICD-10-CM | POA: Diagnosis not present

## 2013-07-22 DIAGNOSIS — N138 Other obstructive and reflux uropathy: Secondary | ICD-10-CM | POA: Diagnosis not present

## 2013-07-22 DIAGNOSIS — N401 Enlarged prostate with lower urinary tract symptoms: Secondary | ICD-10-CM | POA: Diagnosis not present

## 2013-07-22 DIAGNOSIS — Z794 Long term (current) use of insulin: Secondary | ICD-10-CM | POA: Diagnosis not present

## 2013-07-22 DIAGNOSIS — E1142 Type 2 diabetes mellitus with diabetic polyneuropathy: Secondary | ICD-10-CM | POA: Diagnosis not present

## 2013-07-22 DIAGNOSIS — N35919 Unspecified urethral stricture, male, unspecified site: Secondary | ICD-10-CM | POA: Diagnosis not present

## 2013-07-22 DIAGNOSIS — K219 Gastro-esophageal reflux disease without esophagitis: Secondary | ICD-10-CM | POA: Diagnosis not present

## 2013-07-22 DIAGNOSIS — G4733 Obstructive sleep apnea (adult) (pediatric): Secondary | ICD-10-CM | POA: Diagnosis not present

## 2013-07-22 DIAGNOSIS — N3943 Post-void dribbling: Secondary | ICD-10-CM | POA: Diagnosis not present

## 2013-07-22 DIAGNOSIS — E785 Hyperlipidemia, unspecified: Secondary | ICD-10-CM | POA: Diagnosis not present

## 2013-07-22 DIAGNOSIS — N320 Bladder-neck obstruction: Secondary | ICD-10-CM | POA: Diagnosis not present

## 2013-07-22 DIAGNOSIS — Z9079 Acquired absence of other genital organ(s): Secondary | ICD-10-CM | POA: Diagnosis not present

## 2013-07-22 DIAGNOSIS — E1149 Type 2 diabetes mellitus with other diabetic neurological complication: Secondary | ICD-10-CM | POA: Diagnosis not present

## 2013-07-22 DIAGNOSIS — K449 Diaphragmatic hernia without obstruction or gangrene: Secondary | ICD-10-CM | POA: Diagnosis not present

## 2013-07-22 LAB — CBC
HCT: 35.7 % — ABNORMAL LOW (ref 39.0–52.0)
Hemoglobin: 11.8 g/dL — ABNORMAL LOW (ref 13.0–17.0)
MCH: 29.3 pg (ref 26.0–34.0)
MCHC: 33.1 g/dL (ref 30.0–36.0)
MCV: 88.6 fL (ref 78.0–100.0)
Platelets: 177 10*3/uL (ref 150–400)
RBC: 4.03 MIL/uL — ABNORMAL LOW (ref 4.22–5.81)
RDW: 13.9 % (ref 11.5–15.5)
WBC: 7.1 10*3/uL (ref 4.0–10.5)

## 2013-07-22 LAB — BASIC METABOLIC PANEL
BUN: 18 mg/dL (ref 6–23)
CO2: 25 mEq/L (ref 19–32)
Calcium: 9.8 mg/dL (ref 8.4–10.5)
Chloride: 100 mEq/L (ref 96–112)
Creatinine, Ser: 1.31 mg/dL (ref 0.50–1.35)
GFR calc Af Amer: 58 mL/min — ABNORMAL LOW (ref >90)
GFR calc non Af Amer: 50 mL/min — ABNORMAL LOW (ref >90)
Glucose, Bld: 131 mg/dL — ABNORMAL HIGH (ref 70–99)
Potassium: 3.8 mEq/L (ref 3.5–5.1)
Sodium: 137 mEq/L (ref 135–145)

## 2013-07-22 NOTE — Patient Instructions (Addendum)
James Bell  07/22/2013                           YOUR PROCEDURE IS SCHEDULED ON:  07/24/13               PLEASE REPORT TO SHORT STAY CENTER AT :  6:15 AM               CALL THIS NUMBER IF ANY PROBLEMS THE DAY OF SURGERY :               [phone]                      REMEMBER:   Do not eat food or drink liquids AFTER MIDNIGHT   Take these medicines the morning of surgery with A SIP OF WATER:  METOPROLOL / PROTONIX / TAKE 1/2 DOSE OF INSULIN THE NIGHT BEFORE SURGERY   Do not wear jewelry, make-up   Do not wear lotions, powders, or perfumes.   Do not shave legs or underarms 12 hrs. before surgery (men may shave face)  Do not bring valuables to the hospital.  Contacts, dentures or bridgework may not be worn into surgery.  Leave suitcase in the car. After surgery it may be brought to your room.  For patients admitted to the hospital more than one night, checkout time is 11:00                          The day of discharge.   Patients discharged the day of surgery will not be allowed to drive home                             If going home same day of surgery, must have someone stay with you first                           24 hrs at home and arrange for some one to drive you home from hospital.    Special Instructions:   Please read over the following fact sheets that you were given:               1. MRSA  INFORMATION                      2. [facility] PREPARING FOR SURGERY SHEET                                                X_____________________________________________________________________        Failure to follow these instructions may result in cancellation of your surgery

## 2013-07-23 ENCOUNTER — Other Ambulatory Visit: Payer: Medicare Other

## 2013-07-24 ENCOUNTER — Ambulatory Visit (HOSPITAL_COMMUNITY): Payer: Medicare Other | Admitting: Anesthesiology

## 2013-07-24 ENCOUNTER — Ambulatory Visit (HOSPITAL_COMMUNITY)
Admission: RE | Admit: 2013-07-24 | Discharge: 2013-07-24 | Disposition: A | Discharge status: Home / Self Care | Payer: Medicare Other | Source: Ambulatory Visit | Attending: Urology | Admitting: Urology

## 2013-07-24 ENCOUNTER — Encounter (HOSPITAL_COMMUNITY): Payer: Medicare Other | Admitting: Anesthesiology

## 2013-07-24 ENCOUNTER — Encounter (HOSPITAL_COMMUNITY)
Admission: RE | Disposition: A | Discharge status: Home / Self Care | Payer: Self-pay | Source: Ambulatory Visit | Attending: Urology

## 2013-07-24 ENCOUNTER — Encounter (HOSPITAL_COMMUNITY): Payer: Self-pay | Admitting: *Deleted

## 2013-07-24 DIAGNOSIS — N3943 Post-void dribbling: Secondary | ICD-10-CM | POA: Insufficient documentation

## 2013-07-24 DIAGNOSIS — E785 Hyperlipidemia, unspecified: Secondary | ICD-10-CM | POA: Insufficient documentation

## 2013-07-24 DIAGNOSIS — N138 Other obstructive and reflux uropathy: Secondary | ICD-10-CM | POA: Insufficient documentation

## 2013-07-24 DIAGNOSIS — N401 Enlarged prostate with lower urinary tract symptoms: Secondary | ICD-10-CM | POA: Diagnosis not present

## 2013-07-24 DIAGNOSIS — E1142 Type 2 diabetes mellitus with diabetic polyneuropathy: Secondary | ICD-10-CM | POA: Insufficient documentation

## 2013-07-24 DIAGNOSIS — N320 Bladder-neck obstruction: Secondary | ICD-10-CM | POA: Insufficient documentation

## 2013-07-24 DIAGNOSIS — N400 Enlarged prostate without lower urinary tract symptoms: Secondary | ICD-10-CM | POA: Diagnosis not present

## 2013-07-24 DIAGNOSIS — K219 Gastro-esophageal reflux disease without esophagitis: Secondary | ICD-10-CM | POA: Diagnosis not present

## 2013-07-24 DIAGNOSIS — N35919 Unspecified urethral stricture, male, unspecified site: Secondary | ICD-10-CM | POA: Insufficient documentation

## 2013-07-24 DIAGNOSIS — Z794 Long term (current) use of insulin: Secondary | ICD-10-CM | POA: Insufficient documentation

## 2013-07-24 DIAGNOSIS — G4733 Obstructive sleep apnea (adult) (pediatric): Secondary | ICD-10-CM | POA: Insufficient documentation

## 2013-07-24 DIAGNOSIS — Z8673 Personal history of transient ischemic attack (TIA), and cerebral infarction without residual deficits: Secondary | ICD-10-CM | POA: Insufficient documentation

## 2013-07-24 DIAGNOSIS — Z9079 Acquired absence of other genital organ(s): Secondary | ICD-10-CM | POA: Insufficient documentation

## 2013-07-24 DIAGNOSIS — E1149 Type 2 diabetes mellitus with other diabetic neurological complication: Secondary | ICD-10-CM | POA: Insufficient documentation

## 2013-07-24 DIAGNOSIS — K449 Diaphragmatic hernia without obstruction or gangrene: Secondary | ICD-10-CM | POA: Insufficient documentation

## 2013-07-24 HISTORY — PX: CYSTOSCOPY WITH URETHRAL DILATATION: SHX5125

## 2013-07-24 HISTORY — PX: TRANSURETHRAL INCISION OF PROSTATE: SHX2573

## 2013-07-24 LAB — GLUCOSE, CAPILLARY
Glucose-Capillary: 79 mg/dL (ref 70–99)
Glucose-Capillary: 112 mg/dL — ABNORMAL HIGH (ref 70–99)
Glucose-Capillary: 57 mg/dL — ABNORMAL LOW (ref 70–99)

## 2013-07-24 SURGERY — CYSTOSCOPY, WITH URETHRAL DILATION
Anesthesia: General | Wound class: Clean Contaminated

## 2013-07-24 MED ORDER — TRIMETHOPRIM 100 MG PO TABS: 100.0000 mg | ORAL_TABLET | ORAL | Status: DC

## 2013-07-24 MED ORDER — BACITRACIN-NEOMYCIN-POLYMYXIN 400-5-5000 EX OINT: 1.0000 "application " | TOPICAL_OINTMENT | Freq: Two times a day (BID) | CUTANEOUS | Status: DC

## 2013-07-24 MED ORDER — DEXAMETHASONE SODIUM PHOSPHATE 10 MG/ML IJ SOLN
INTRAMUSCULAR | Status: DC | PRN
  Administered 2013-07-24: 10 mg via INTRAVENOUS

## 2013-07-24 MED ORDER — PROPOFOL 10 MG/ML IV BOLUS
INTRAVENOUS | Status: DC | PRN
  Administered 2013-07-24: 150 mg via INTRAVENOUS

## 2013-07-24 MED ORDER — LIDOCAINE HCL (CARDIAC) 20 MG/ML IV SOLN
INTRAVENOUS | Status: DC | PRN
  Administered 2013-07-24: 100 mg via INTRAVENOUS

## 2013-07-24 MED ORDER — ONDANSETRON HCL 4 MG/2ML IJ SOLN
INTRAMUSCULAR | Status: DC | PRN
  Administered 2013-07-24: 4 mg via INTRAVENOUS

## 2013-07-24 MED ORDER — URELLE 81 MG PO TABS: 1.0000 | ORAL_TABLET | Freq: Three times a day (TID) | ORAL | Status: DC

## 2013-07-24 MED ORDER — LACTATED RINGERS IV SOLN
INTRAVENOUS | Status: DC
  Administered 2013-07-24: 11:00:00 via INTRAVENOUS
  Administered 2013-07-24: 1000 mL via INTRAVENOUS

## 2013-07-24 MED ORDER — BELLADONNA ALKALOIDS-OPIUM 16.2-60 MG RE SUPP
RECTAL | Status: DC | PRN
  Administered 2013-07-24: 1 via RECTAL

## 2013-07-24 MED ORDER — FENTANYL CITRATE 0.05 MG/ML IJ SOLN
INTRAMUSCULAR | Status: DC | PRN
  Administered 2013-07-24: 50 ug via INTRAVENOUS
  Administered 2013-07-24: 25 ug via INTRAVENOUS

## 2013-07-24 MED ORDER — KETOROLAC TROMETHAMINE 30 MG/ML IJ SOLN: 15.0000 mg | Freq: Once | INTRAMUSCULAR | Status: DC | PRN

## 2013-07-24 MED ORDER — BACITRACIN-NEOMYCIN-POLYMYXIN 400-5-5000 EX OINT
TOPICAL_OINTMENT | CUTANEOUS | Status: DC | PRN
  Administered 2013-07-24: 1 via TOPICAL

## 2013-07-24 MED ORDER — FENTANYL CITRATE 0.05 MG/ML IJ SOLN: 25.0000 ug | INTRAMUSCULAR | Status: DC | PRN

## 2013-07-24 MED ORDER — BACITRACIN-NEOMYCIN-POLYMYXIN 400-5-5000 EX OINT
TOPICAL_OINTMENT | CUTANEOUS | Status: AC
  Filled 2013-07-24: qty 1

## 2013-07-24 MED ORDER — TRAMADOL-ACETAMINOPHEN 37.5-325 MG PO TABS: 1.0000 | ORAL_TABLET | Freq: Four times a day (QID) | ORAL | Status: DC | PRN

## 2013-07-24 MED ORDER — DEXTROSE 50 % IV SOLN
INTRAVENOUS | Status: AC
  Filled 2013-07-24: qty 50

## 2013-07-24 MED ORDER — SODIUM CHLORIDE 0.9 % IR SOLN
Status: DC | PRN
  Administered 2013-07-24: 3000 mL via INTRAVESICAL

## 2013-07-24 MED ORDER — BELLADONNA ALKALOIDS-OPIUM 16.2-60 MG RE SUPP
RECTAL | Status: AC
  Filled 2013-07-24: qty 1

## 2013-07-24 MED ORDER — EPHEDRINE SULFATE 50 MG/ML IJ SOLN
INTRAMUSCULAR | Status: DC | PRN
  Administered 2013-07-24: 5 mg via INTRAVENOUS
  Administered 2013-07-24: 10 mg via INTRAVENOUS

## 2013-07-24 MED ORDER — LIDOCAINE HCL 2 % EX GEL
CUTANEOUS | Status: AC
  Filled 2013-07-24: qty 10

## 2013-07-24 MED ORDER — MIDAZOLAM HCL 5 MG/5ML IJ SOLN
INTRAMUSCULAR | Status: DC | PRN
  Administered 2013-07-24: 0.5 mg via INTRAVENOUS

## 2013-07-24 MED ORDER — DEXTROSE 50 % IV SOLN
25.0000 mL | Freq: Once | INTRAVENOUS | Status: AC | PRN
  Administered 2013-07-24: 25 mL via INTRAVENOUS

## 2013-07-24 MED ORDER — CEFAZOLIN SODIUM-DEXTROSE 2-3 GM-% IV SOLR
2.0000 g | INTRAVENOUS | Status: AC
  Administered 2013-07-24: 2 g via INTRAVENOUS

## 2013-07-24 MED ORDER — LIDOCAINE HCL 2 % EX GEL
CUTANEOUS | Status: DC | PRN
  Administered 2013-07-24: 1

## 2013-07-24 MED ORDER — CEFAZOLIN SODIUM-DEXTROSE 2-3 GM-% IV SOLR
INTRAVENOUS | Status: AC
  Filled 2013-07-24: qty 50

## 2013-07-24 MED ORDER — PROMETHAZINE HCL 25 MG/ML IJ SOLN: 6.2500 mg | INTRAMUSCULAR | Status: DC | PRN

## 2013-07-24 SURGICAL SUPPLY — 30 items
BAG URINE DRAINAGE (UROLOGICAL SUPPLIES) ×2 IMPLANT
BAG URO CATCHER STRL LF (DRAPE) ×2 IMPLANT
BLADE SURG 15 STRL LF DISP TIS (BLADE) IMPLANT
BLADE SURG 15 STRL SS (BLADE)
CATH AINSWORTH 30CC 24FR (CATHETERS) IMPLANT
CATH FOLEY 2WAY SLVR 30CC 22FR (CATHETERS) ×2 IMPLANT
CATH FOLEY 3WAY 30CC 24FR (CATHETERS)
CATH URO 16X24FR 3W FL PS (CATHETERS) IMPLANT
CLOTH BEACON ORANGE TIMEOUT ST (SAFETY) ×2 IMPLANT
DRAPE CAMERA CLOSED 9X96 (DRAPES) ×2 IMPLANT
ELECT BUTTON HF 24-28F 2 30DE (ELECTRODE) IMPLANT
ELECT LOOP MED HF 24F 12D (CUTTING LOOP) IMPLANT
ELECT LOOP MED HF 24F 12D CBL (CLIP) IMPLANT
ELECT RESECT VAPORIZE 12D CBL (ELECTRODE) ×2 IMPLANT
GLOVE BIOGEL M STRL SZ7.5 (GLOVE) ×2 IMPLANT
GOWN PREVENTION PLUS LG XLONG (DISPOSABLE) IMPLANT
GOWN STRL REIN XL XLG (GOWN DISPOSABLE) ×2 IMPLANT
HOLDER FOLEY CATH W/STRAP (MISCELLANEOUS) ×2 IMPLANT
KIT ASPIRATION TUBING (SET/KITS/TRAYS/PACK) ×2 IMPLANT
KIT SUPRAPUBIC CATH (MISCELLANEOUS) IMPLANT
MANIFOLD NEPTUNE II (INSTRUMENTS) ×2 IMPLANT
MARKER SKIN DUAL TIP RULER LAB (MISCELLANEOUS) IMPLANT
NEEDLE SPNL 18GX3.5 QUINCKE PK (NEEDLE) IMPLANT
NS IRRIG 1000ML POUR BTL (IV SOLUTION) ×2 IMPLANT
PACK CYSTO (CUSTOM PROCEDURE TRAY) ×2 IMPLANT
SCRUB PCMX 4 OZ (MISCELLANEOUS) IMPLANT
SUT ETHILON 3 0 PS 1 (SUTURE) IMPLANT
SYR 30ML LL (SYRINGE) ×2 IMPLANT
SYRINGE IRR TOOMEY STRL 70CC (SYRINGE) ×2 IMPLANT
TUBING CONNECTING 10 (TUBING) ×2 IMPLANT

## 2013-07-24 NOTE — Interval H&P Note (Signed)
History and Physical Interval Note:  07/24/2013 7:36 AM  [name]  has presented today for surgery, with the diagnosis of bph/Bladder Neck Contracture  The various methods of treatment have been discussed with the patient and family. After consideration of risks, benefits and other options for treatment, the patient has consented to  Procedure(s): CYSTOSCOPY WITH URETHRAL DILATATION (N/A) TRANSURETHRAL INCISION OF THE PROSTATE (TUIP) (N/A) as a surgical intervention .  The patient's history has been reviewed, patient examined, no change in status, stable for surgery.  I have reviewed the patient's chart and labs.  Questions were answered to the patient's satisfaction.     Jethro Bolus I

## 2013-07-24 NOTE — H&P (Signed)
Urology Admission H&P  Chief Complaint: Urinary dribbling  History of Present Illness:   79 year old male status post laser prostatectomy in [place] many years ago, for BPH. He is post CVA in June 2013, and complains of severe nocturia every 2 hours, with international prostate symptom score she does 16. He has nocturia x4. He has been treated with Flomax and cystoscopy, but has not helped his symptoms. Video urodynamics showed a 550 cc bladder capacity, with low amplitude instability. No leakage was noted. The patient was able to void only 52 cc. His bladder contraction pressure is 36-56 cm water. He uses positional changes in order to try to void. He has a 1.1 cc per second peak urinary flow. He had great difficulty in having urinary catheter inserted into the bladder neck. Note his past history of diabetes, as well as GERD. The patient will now have cystoscopy under anesthesia, and possible transurethral incision of bladder neck contracture.  Past Medical History  Diagnosis Date  . HLD (hyperlipidemia)   . Restless leg syndrome     well controlled on mirapex  . OSA on CPAP     severe, CPAP at 12, previously on nuvigil (Dr. Frances Furbish)  . Type 2 diabetes, controlled, with neuropathy 2011  . H/O hiatal hernia   . GERD (gastroesophageal reflux disease)     with sliding HH  . Migraines 03/11/12    now rare  . CVA (cerebral infarction) 03/07/12    slurred speech; right hemplegia, left handed  . Basal cell carcinoma of nose 2002    bridge of nose  . History of kidney problems 12/2010    lacerated kidney after fall  . BPH with obstruction/lower urinary tract symptoms     failed flomax, uroxatral, myrbetriq - sees urology Patsi Sears) pending videourodynamics  . Depression     mild, on cymbalta per prior PCP  . Palpitations 01/2012    holter WNL, rare PAC/PVCs  . Anemia   . Erectile dysfunction     per urology (prostaglandin injection)   Past Surgical History  Procedure Laterality  Date  . Prostate surgery  02/2009    PVP (laser)  . Tonsillectomy and adenoidectomy  ~ 1945  . Hemorrhoid surgery  1991  . Cataract extraction w/ intraocular lens implant  11//2012 - 08/2011  . Cardiac catheterization  2004    normal  . Inguinal hernia repair  1981; 2002    left, then repaired  . Skin cancer excision  2002    bridge of nose, BCC  . Colonoscopy  05/2011    normal per pt (Dr. Pearlean Brownie, [place], [place])  . Mri brain  04/2010    WNL  . Esophagogastroduodenoscopy  2006    duodenitis, medual HH, Grade 2 reflux, mild gastritis  . Colonoscopy  05/2011    no records received from Dr. Pearlean Brownie  . Vanice Sarah  2007    WNL    Home Medications:  Prescriptions prior to admission  Medication Sig Dispense Refill  . atorvastatin (LIPITOR) 40 MG tablet Take 40 mg by mouth daily.      . cetirizine (ZYRTEC) 10 MG tablet Take 10 mg by mouth as needed for allergies.      . Coenzyme Q10 (COQ10) 150 MG CAPS Take 1 capsule by mouth 2 (two) times daily.      . diazepam (VALIUM) 5 MG tablet Take 5 mg by mouth every 8 (eight) hours as needed for anxiety.      . DULoxetine (CYMBALTA) 60 MG  capsule Take 60 mg by mouth at bedtime.      . fish oil-omega-3 fatty acids 1000 MG capsule Take 2 g by mouth 2 (two) times daily.      . Folic Acid-Vit B6-Vit B12 (FOLBEE) 2.5-25-1 MG TABS tablet Take 1 tablet by mouth daily.      . insulin glargine (LANTUS) 100 UNIT/ML injection Inject 50 Units into the skin at bedtime.      . metoprolol tartrate (LOPRESSOR) 25 MG tablet Take 25 mg by mouth 2 (two) times daily as needed (rapid heart rate).      . Multiple Vitamin (MULTIVITAMIN) tablet Take 1 tablet by mouth daily.      . pantoprazole (PROTONIX) 40 MG tablet Take 40 mg by mouth 2 (two) times daily as needed (acid reflex).      . pramipexole (MIRAPEX) 1 MG tablet Take 1 mg by mouth at bedtime.      . SitaGLIPtin-MetFORMIN HCl 50-1000 MG TB24 Take 1 tablet by mouth 2 (two) times daily before a meal.      . vitamin C  (ASCORBIC ACID) 500 MG tablet Take 1,000 mg by mouth daily.       . clopidogrel (PLAVIX) 75 MG tablet Take 75 mg by mouth daily.       Allergies:  Allergies  Allergen Reactions  . Lisinopril Cough  . Nexium [Esomeprazole] Other (See Comments)    Headache  . Niacin And Related Other (See Comments)    flushing    Family History  Problem Relation Age of Onset  . Stroke Father   . Lung cancer Father 34     smoker  . Stroke Brother   . Diabetes Brother 25  . Coronary artery disease Neg Hx   . Lung cancer Brother   . Alcoholism Sister   . Kidney disease Sister    Social History:  reports that he has never smoked. He has never used smokeless tobacco. He reports that he drinks about 2.4 ounces of alcohol per week. He reports that he does not use illicit drugs.  ROS  Physical Exam:  Vital signs in last 24 hours: Temp:  [97.5 F (36.4 C)] 97.5 F (36.4 C) (11/06 0629) Pulse Rate:  [82] 82 (11/06 0629) Resp:  [20] 20 (11/06 0629) BP: (152)/(74) 152/74 mmHg (11/06 0629) SpO2:  [100 %] 100 % (11/06 0629) Physical Exam: Examination the penis shows no discharge no masses no lesions and normal meatus. Scrotum is normal without lesions. The right epididymis is palpably normal, and nontender. The left epididymis is also normal and nontender. The testicles are descended bilaterally and normal. The fascial normal. Skin is normal. Neurologic is normal physiologic. Abdomen is normal soft and nontender. Cremaster shows no peripheral edema. Pulmonary shows no S3 distress. Neck shows no masses. ENT shows normal appearance. Constitutional shows the patient is well developed and well-nourished.  Laboratory Data:  Results for orders placed during the hospital encounter of 07/24/13 (from the past 24 hour(s))  GLUCOSE, CAPILLARY     Status: None   Collection Time    07/24/13  6:51 AM      Result Value Range   Glucose-Capillary 79  70 - 99 mg/dL   No results found for this or any previous visit  (from the past 240 hour(s)). Creatinine:  Recent Labs  07/22/13 1045  CREATININE 1.31   Baseline Creatinine:   Impression/Assessment:  Bladder outlet secondary to bladder neck contraction from previous surgery. You have cystoscopy, probable bladder neck incision.  Plan:  Cystoscopy under anesthesia, and probable bladder neck incision.  Honestii Marton I 07/24/2013, 7:31 AM

## 2013-07-24 NOTE — Preoperative (Signed)
Beta Blockers   Reason not to administer Beta Blockers:Metoprolol taken at 0500 07-24-13

## 2013-07-24 NOTE — Anesthesia Postprocedure Evaluation (Signed)
  Anesthesia Post-op Note  Patient: James Bell  Procedure(s) Performed: Procedure(s) (LRB): CYSTOSCOPY WITH URETHRAL DILATATION (N/A) TRANSURETHRAL INCISION OF THE PROSTATE (TUIP) (N/A)  Patient Location: PACU  Anesthesia Type: General  Level of Consciousness: awake and alert   Airway and Oxygen Therapy: Patient Spontanous Breathing  Post-op Pain: mild  Post-op Assessment: Post-op Vital signs reviewed, Patient's Cardiovascular Status Stable, Respiratory Function Stable, Patent Airway and No signs of Nausea or vomiting  Last Vitals:  Filed Vitals:   07/24/13 1015  BP: 138/60  Pulse: 81  Temp:   Resp: 19    Post-op Vital Signs: stable   Complications: No apparent anesthesia complications

## 2013-07-24 NOTE — Anesthesia Preprocedure Evaluation (Addendum)
Anesthesia Evaluation  Patient identified by MRN, date of birth, ID band Patient awake    Reviewed: Allergy & Precautions, H&P , NPO status , Patient's Chart, lab work & pertinent test results  Airway Mallampati: II TM Distance: >3 FB Neck ROM: Limited    Dental no notable dental hx.    Pulmonary sleep apnea and Continuous Positive Airway Pressure Ventilation ,  breath sounds clear to auscultation  Pulmonary exam normal       Cardiovascular negative cardio ROS  Rhythm:Regular Rate:Normal     Neuro/Psych CVA, Residual Symptoms negative psych ROS   GI/Hepatic Neg liver ROS, GERD-  Medicated,  Endo/Other  diabetes, Type 2, Insulin Dependent  Renal/GU negative Renal ROS  negative genitourinary   Musculoskeletal negative musculoskeletal ROS (+)   Abdominal   Peds negative pediatric ROS (+)  Hematology negative hematology ROS (+)   Anesthesia Other Findings   Reproductive/Obstetrics negative OB ROS                           Anesthesia Physical Anesthesia Plan  ASA: III  Anesthesia Plan: General   Post-op Pain Management:    Induction: Intravenous  Airway Management Planned: LMA  Additional Equipment:   Intra-op Plan:   Post-operative Plan:   Informed Consent: I have reviewed the patients History and Physical, chart, labs and discussed the procedure including the risks, benefits and alternatives for the proposed anesthesia with the patient or authorized representative who has indicated his/her understanding and acceptance.   Dental advisory given  Plan Discussed with: CRNA and Surgeon  Anesthesia Plan Comments:         Anesthesia Quick Evaluation

## 2013-07-24 NOTE — Progress Notes (Signed)
(  late entry) foley care taught to patient and girlfriend  See d/c instructions  Leg bag/drainage bag switch, emptying taught

## 2013-07-24 NOTE — Op Note (Signed)
Pre-operative diagnosis :   Severe bladder outlet obstruction  Postoperative diagnosis:  1 severe meatal stenosis. 2 bladder neck contracture  Operation:  Urethral meatal dilation and gyrus transurethral vaporization of neck contracture  Surgeon:  S. Patsi Sears, MD  First assistant:  None  Anesthesia:  General LMA  Preparation:  After appropriate preanesthesia, the patient was brought to the operative room, placed on the operating table in the dorsal supine position where general LMA anesthesia was introduced. He was replaced in the dorsal lithotomy position with pubis was prepped with Betadine solution and draped in usual fashion. History was reviewed. Armband was double checked.  Review history:  79 year old male status post laser prostatectomy in [place] many years ago, for BPH. He is post CVA in June 2013, and complains of severe nocturia every 2 hours, with international prostate symptom score she does 16. He has nocturia x4. He has been treated with Flomax and cystoscopy, but has not helped his symptoms. Video urodynamics showed a 550 cc bladder capacity, with low amplitude instability. No leakage was noted. The patient was able to void only 52 cc. His bladder contraction pressure is 36-56 cm water. He uses positional changes in order to try to void. He has a 1.1 cc per second peak urinary flow. He had great difficulty in having urinary catheter inserted into the bladder neck. Note his past history of diabetes, as well as GERD. The patient will now have cystoscopy under anesthesia, and possible transurethral incision of bladder neck contracture.   Statement of  Likelihood of Success: Excellent. TIME-OUT observed.:  Procedure:  The urethral meatus was noted to be quite scarred, and would not admit the cystoscope. Therefore, the urethra meatus was systematically dilated from a size 14 [place] to a size 60 [place] with the male sounds. Some bleeding was noted from the meatal dilation.  A 24 continuous flow resectoscope was then placed at the level bladder neck, the patient was noted to have a bladder neck contracture from previous surgery. Cystoscopy revealed trabeculation and the bladder, but no definite cellule formation. There was no evidence of bladder stone diverticular formation or tumor formation. I elected to use the gyrus instrumentation, with the button attachment, and vaporize a trough at the 5:00 position, and this was accomplished at the bladder neck, to the level of the Vero. No bleeding was noted. Following this, a size 22 Foley catheter with 30 cc balloon was easily passed into the bladder. No traction is needed. No irrigation was needed. The patient had Xylocaine placed in the urethra prior to placement of the catheter, and Neosporin ointment was placed at the tip of the urethra. He was awakened, and taken to recovery room in good condition. A B. and O. suppository was given at the beginning of the procedure.

## 2013-07-24 NOTE — Progress Notes (Signed)
Hypoglycemic Event  CBG:57  Treatment: D50 IV 25 mL  Symptoms: None Follow-up CBG: TIME 10:25 CBG Result:112  Possible Reasons for Event: Inadequate meal intake  Comments/MD notified:Rose    Aalyiah Camberos, [place]  Remember to initiate Hypoglycemia Order Set & complete

## 2013-07-24 NOTE — Transfer of Care (Signed)
Immediate Anesthesia Transfer of Care Note  Patient: James Bell  Procedure(s) Performed: Procedure(s): CYSTOSCOPY WITH URETHRAL DILATATION (N/A) TRANSURETHRAL INCISION OF THE PROSTATE (TUIP) (N/A)  Patient Location: PACU  Anesthesia Type:General  Level of Consciousness: awake, alert , oriented and patient cooperative  Airway & Oxygen Therapy: Patient Spontanous Breathing and Patient connected to face mask oxygen  Post-op Assessment: Report given to PACU RN, Post -op Vital signs reviewed and stable and Patient moving all extremities  Post vital signs: Reviewed and stable  Complications: No apparent anesthesia complications

## 2013-07-25 ENCOUNTER — Encounter (HOSPITAL_COMMUNITY): Payer: Self-pay | Admitting: Urology

## 2013-07-25 LAB — GLUCOSE, CAPILLARY: Glucose-Capillary: 99 mg/dL (ref 70–99)

## 2013-07-28 DIAGNOSIS — N320 Bladder-neck obstruction: Secondary | ICD-10-CM | POA: Diagnosis not present

## 2013-07-29 ENCOUNTER — Ambulatory Visit (INDEPENDENT_AMBULATORY_CARE_PROVIDER_SITE_OTHER): Payer: Medicare Other

## 2013-07-29 DIAGNOSIS — I6350 Cerebral infarction due to unspecified occlusion or stenosis of unspecified cerebral artery: Secondary | ICD-10-CM

## 2013-07-30 ENCOUNTER — Telehealth: Payer: Self-pay | Admitting: Neurology

## 2013-07-30 NOTE — Telephone Encounter (Signed)
This patient walked into the sleep lab requesting to proceed with a sleep study that he didn't have a few months ago.  There are several notes in the patients chart regarding auto titration.  I did not find any previous orders for a sleep study.  Advised the patient that I would send a notification to Dr. Frances Furbish to see what the patient needs at this juncture.  Also copying [place] due to being the last point of contact.

## 2013-07-30 NOTE — Telephone Encounter (Signed)
Patient was supposed to meet with his DME in [place] some time ago (I spoke with a therapist who said she would contact him) to set up auto-titration.  He kind of fell off the grid when he moved.  I am assuming now that must have not happened.

## 2013-08-04 DIAGNOSIS — N529 Male erectile dysfunction, unspecified: Secondary | ICD-10-CM | POA: Diagnosis not present

## 2013-08-22 ENCOUNTER — Ambulatory Visit (HOSPITAL_BASED_OUTPATIENT_CLINIC_OR_DEPARTMENT_OTHER): Payer: Medicare Other | Admitting: Physical Medicine & Rehabilitation

## 2013-08-22 ENCOUNTER — Encounter: Payer: Self-pay | Admitting: Physical Medicine & Rehabilitation

## 2013-08-22 ENCOUNTER — Encounter: Payer: Medicare Other | Attending: Physical Medicine & Rehabilitation

## 2013-08-22 VITALS — BP 144/60 | HR 80 | Resp 14 | Ht 68.0 in | Wt 187.2 lb

## 2013-08-22 DIAGNOSIS — M79609 Pain in unspecified limb: Secondary | ICD-10-CM | POA: Diagnosis not present

## 2013-08-22 DIAGNOSIS — M25819 Other specified joint disorders, unspecified shoulder: Secondary | ICD-10-CM | POA: Insufficient documentation

## 2013-08-22 DIAGNOSIS — G8110 Spastic hemiplegia affecting unspecified side: Secondary | ICD-10-CM

## 2013-08-22 DIAGNOSIS — I69959 Hemiplegia and hemiparesis following unspecified cerebrovascular disease affecting unspecified side: Secondary | ICD-10-CM | POA: Insufficient documentation

## 2013-08-22 NOTE — Patient Instructions (Signed)
Botox to the lumbrical muscles of the right hand next visit  This will be followed by musculo cutaneous nerve block in the right arm

## 2013-08-22 NOTE — Progress Notes (Signed)
Subjective:    Patient ID: James Bell, male    DOB: [birth year], 79 y.o.   MRN: [id number]  HPI  Continues to have difficulty extending his fingers. No thumb flexor or wrist flexor spasticity. No DIP or PIP finger spasticity Pain Inventory Average Pain 0 Pain Right Now 0 My pain is no pain  In the last 24 hours, has pain interfered with the following? General activity 0 Relation with others 0 Enjoyment of life 0 What TIME of day is your pain at its worst? no pain Sleep (in general) Fair  Pain is worse with: no pain Pain improves with: no pain Relief from Meds: no pain meds  Mobility walk without assistance use a cane ability to climb steps?  yes do you drive?  yes  Function retired  Neuro/Psych No problems in this area  Prior Studies Any changes since last visit?  no  Physicians involved in your care Any changes since last visit?  no   Family History  Problem Relation Age of Onset  . Stroke Father   . Lung cancer Father 40     smoker  . Stroke Brother   . Diabetes Brother 79  . Coronary artery disease Neg Hx   . Lung cancer Brother   . Alcoholism Sister   . Kidney disease Sister    History   Social History  . Marital Status: Unknown    Spouse Name: N/A    Number of Children: 2  . Years of Education: MD   Occupational History  . retired   .     Social History Main Topics  . Smoking status: Never Smoker   . Smokeless tobacco: Never Used     Comment: "used to smoke pipe when I was younger"  . Alcohol Use: 2.4 oz/week    2 Glasses of wine, 2 Shots of liquor per week     Comment: occasional  . Drug Use: No  . Sexual Activity: Yes   Other Topics Concern  . None   Social History Narrative   Caffeine: 1 cup coffee/day   Divorced   Occupation: ophthalmologist   Edu: MD   Activity: rehab s/p CVA   Diet: some water, fruits/vegetables daily   POA - Tyrone Schimke (Daughter)   Past Surgical History  Procedure Laterality Date  .  Prostate surgery  02/2009    PVP (laser)  . Tonsillectomy and adenoidectomy  ~ 1945  . Hemorrhoid surgery  1991  . Cataract extraction w/ intraocular lens implant  11//2012 - 08/2011  . Cardiac catheterization  2004    normal  . Inguinal hernia repair  1981; 2002    left, then repaired  . Skin cancer excision  2002    bridge of nose, BCC  . Colonoscopy  05/2011    normal per pt (Dr. Pearlean Brownie, [facility], [place])  . Mri brain  04/2010    WNL  . Esophagogastroduodenoscopy  2006    duodenitis, medual HH, Grade 2 reflux, mild gastritis  . Colonoscopy  05/2011    no records received from Dr. Pearlean Brownie  . Abi  2007    WNL  . Urethral dilation  2014    tannenbaum  . Cystoscopy with urethral dilatation N/A 07/24/2013    Procedure: CYSTOSCOPY WITH URETHRAL DILATATION;  Surgeon: Kathi Ludwig, MD;  Location: [facility];  Service: Urology;  Laterality: N/A;  . Transurethral incision of prostate N/A 07/24/2013    Procedure: TRANSURETHRAL INCISION OF THE PROSTATE (TUIP);  Surgeon: Kathi Ludwig, MD;  Location: [facility];  Service: Urology;  Laterality: N/A;   Past Medical History  Diagnosis Date  . HLD (hyperlipidemia)   . Restless leg syndrome     well controlled on mirapex  . OSA on CPAP     severe, CPAP at 12, previously on nuvigil (Dr. Frances Furbish)  . Type 2 diabetes, controlled, with neuropathy 2011  . H/O hiatal hernia   . GERD (gastroesophageal reflux disease)     with sliding HH  . Migraines 03/11/12    now rare  . CVA (cerebral infarction) 03/07/12    slurred speech; right hemplegia, left handed  . Basal cell carcinoma of nose 2002    bridge of nose  . History of kidney problems 12/2010    lacerated kidney after fall  . BPH with obstruction/lower urinary tract symptoms     failed flomax, uroxatral, myrbetriq - sees urology Patsi Sears) pending videourodynamics  . Depression     mild, on cymbalta per prior PCP  . Palpitations 01/2012    holter WNL, rare PAC/PVCs  . Anemia   .  Erectile dysfunction     per urology (prostaglandin injection)   BP 144/60  Pulse 80  Resp 14  Ht 5\' 8"  (1.727 m)  Wt 187 lb 3.2 oz (84.913 kg)  BMI 28.47 kg/m2  SpO2 98%      Review of Systems  All other systems reviewed and are negative.       Objective:   Physical Exam  Lumbrical 2 through 5 with Ashworth grade 2-3 spasticity  FPL Ashworth 0  FDS Ashworth 0  FDP Ashworth 0  Biceps Ashworth 2  Wrist flexors Ashworth 1  Now able to reach behind back to touch buttocks area on the right side  Also able to reach with right upper extremity to touch ear       Assessment & Plan:  #1. Right spastic hemiplegia secondary to CVA. He has an uncommon pattern of muscle involvement. Would recommend repeat botulinum toxin injections to the lumbricals 12.5 units in each muscle Total 37.5 Units  After that I would recommend musculocutaneous neurologic with phenol for elbow flexor spasticity right upper extremity  Discussed with the patient, agrees with plan

## 2013-09-30 ENCOUNTER — Other Ambulatory Visit: Payer: Self-pay | Admitting: Family Medicine

## 2013-10-01 NOTE — Telephone Encounter (Signed)
Ok to refill 

## 2013-10-13 ENCOUNTER — Encounter: Payer: Self-pay | Admitting: Physical Medicine & Rehabilitation

## 2013-10-13 ENCOUNTER — Ambulatory Visit (HOSPITAL_BASED_OUTPATIENT_CLINIC_OR_DEPARTMENT_OTHER): Payer: Medicare Other | Admitting: Physical Medicine & Rehabilitation

## 2013-10-13 ENCOUNTER — Encounter: Payer: Medicare Other | Attending: Physical Medicine & Rehabilitation

## 2013-10-13 VITALS — BP 151/74 | HR 82 | Resp 14 | Ht 69.0 in | Wt 181.0 lb

## 2013-10-13 DIAGNOSIS — M7580 Other shoulder lesions, unspecified shoulder: Secondary | ICD-10-CM

## 2013-10-13 DIAGNOSIS — G8110 Spastic hemiplegia affecting unspecified side: Secondary | ICD-10-CM

## 2013-10-13 DIAGNOSIS — I69959 Hemiplegia and hemiparesis following unspecified cerebrovascular disease affecting unspecified side: Secondary | ICD-10-CM | POA: Diagnosis not present

## 2013-10-13 DIAGNOSIS — M79609 Pain in unspecified limb: Secondary | ICD-10-CM | POA: Diagnosis not present

## 2013-10-13 DIAGNOSIS — M25819 Other specified joint disorders, unspecified shoulder: Secondary | ICD-10-CM | POA: Diagnosis not present

## 2013-10-13 NOTE — Patient Instructions (Signed)
Right musculocutaneous nerve block with phenol next visit   Will order therapy next visit depending on results from hand and forearm injection  You received a Botox injection today. You may experience soreness at the needle injection sites. Please call us if any of the injection sites turns red after a couple days or if there is any drainage. You may experience muscle weakness as a result of Botox. This would improve with time but can take several weeks to improve. The Botox should start working in about one week. The Botox usually last 3 months. The injection can be repeated every 3 months as needed.

## 2013-10-13 NOTE — Progress Notes (Signed)
Botox Injection for Right upper extremity spasticity using needle EMG guidance    Dilution: 50 Units/ml Indication: Severe spasticity which interferes with ADL,mobility and/or  hygiene and is unresponsive to medication management and other conservative care Informed consent was obtained after describing risks and benefits of the procedure with the patient. This includes bleeding, bruising, infection, excessive weakness, or medication side effects. A REMS form is on file and signed. Needle: 25 gauge 2 inch needle electrode Number of units per muscle  FCR 37.5 units  Lumbricals 37.5 units  Flexor digitorum profundus medial aspect 25 units

## 2013-10-25 ENCOUNTER — Other Ambulatory Visit: Payer: Self-pay | Admitting: Family Medicine

## 2013-10-25 DIAGNOSIS — K219 Gastro-esophageal reflux disease without esophagitis: Secondary | ICD-10-CM

## 2013-10-25 DIAGNOSIS — I639 Cerebral infarction, unspecified: Secondary | ICD-10-CM

## 2013-10-25 DIAGNOSIS — E1140 Type 2 diabetes mellitus with diabetic neuropathy, unspecified: Secondary | ICD-10-CM

## 2013-10-25 DIAGNOSIS — E785 Hyperlipidemia, unspecified: Secondary | ICD-10-CM

## 2013-10-25 DIAGNOSIS — D649 Anemia, unspecified: Secondary | ICD-10-CM

## 2013-10-25 DIAGNOSIS — D539 Nutritional anemia, unspecified: Secondary | ICD-10-CM

## 2013-10-25 DIAGNOSIS — R7989 Other specified abnormal findings of blood chemistry: Secondary | ICD-10-CM

## 2013-10-27 ENCOUNTER — Other Ambulatory Visit (INDEPENDENT_AMBULATORY_CARE_PROVIDER_SITE_OTHER): Payer: Medicare Other

## 2013-10-27 DIAGNOSIS — D539 Nutritional anemia, unspecified: Secondary | ICD-10-CM

## 2013-10-27 DIAGNOSIS — D649 Anemia, unspecified: Secondary | ICD-10-CM

## 2013-10-27 DIAGNOSIS — R7989 Other specified abnormal findings of blood chemistry: Secondary | ICD-10-CM

## 2013-10-27 DIAGNOSIS — R799 Abnormal finding of blood chemistry, unspecified: Secondary | ICD-10-CM

## 2013-10-27 DIAGNOSIS — E1142 Type 2 diabetes mellitus with diabetic polyneuropathy: Secondary | ICD-10-CM | POA: Diagnosis not present

## 2013-10-27 DIAGNOSIS — E1149 Type 2 diabetes mellitus with other diabetic neurological complication: Secondary | ICD-10-CM

## 2013-10-27 DIAGNOSIS — E785 Hyperlipidemia, unspecified: Secondary | ICD-10-CM

## 2013-10-27 DIAGNOSIS — E1140 Type 2 diabetes mellitus with diabetic neuropathy, unspecified: Secondary | ICD-10-CM

## 2013-10-27 LAB — LIPID PANEL
Cholesterol: 160 mg/dL (ref 0–200)
HDL: 19.2 mg/dL — ABNORMAL LOW (ref >39.00)
Total CHOL/HDL Ratio: 8
Triglycerides: 218 mg/dL — ABNORMAL HIGH (ref 0.0–149.0)
VLDL: 43.6 mg/dL — ABNORMAL HIGH (ref 0.0–40.0)

## 2013-10-27 LAB — CBC WITH DIFFERENTIAL/PLATELET
Basophils Absolute: 0 10*3/uL (ref 0.0–0.1)
Basophils Relative: 0.2 % (ref 0.0–3.0)
Eosinophils Absolute: 0.2 10*3/uL (ref 0.0–0.7)
Eosinophils Relative: 1.5 % (ref 0.0–5.0)
HCT: 37.7 % — ABNORMAL LOW (ref 39.0–52.0)
Hemoglobin: 12.6 g/dL — ABNORMAL LOW (ref 13.0–17.0)
Lymphocytes Relative: 23.9 % (ref 12.0–46.0)
Lymphs Abs: 2.6 10*3/uL (ref 0.7–4.0)
MCHC: 33.4 g/dL (ref 30.0–36.0)
MCV: 90 fl (ref 78.0–100.0)
Monocytes Absolute: 0.7 10*3/uL (ref 0.1–1.0)
Monocytes Relative: 6.5 % (ref 3.0–12.0)
Neutro Abs: 7.4 10*3/uL (ref 1.4–7.7)
Neutrophils Relative %: 67.9 % (ref 43.0–77.0)
Platelets: 308 10*3/uL (ref 150.0–400.0)
RBC: 4.19 Mil/uL — ABNORMAL LOW (ref 4.22–5.81)
RDW: 13.6 % (ref 11.5–14.6)
WBC: 10.9 10*3/uL — ABNORMAL HIGH (ref 4.5–10.5)

## 2013-10-27 LAB — VITAMIN B12: Vitamin B-12: 1324 pg/mL — ABNORMAL HIGH (ref 211–911)

## 2013-10-27 LAB — RENAL FUNCTION PANEL
Albumin: 3.5 g/dL (ref 3.5–5.2)
BUN: 23 mg/dL (ref 6–23)
CO2: 26 mEq/L (ref 19–32)
Calcium: 9 mg/dL (ref 8.4–10.5)
Chloride: 103 mEq/L (ref 96–112)
Creatinine, Ser: 1.4 mg/dL (ref 0.4–1.5)
GFR: 50.94 mL/min — ABNORMAL LOW (ref >60.00)
Glucose, Bld: 116 mg/dL — ABNORMAL HIGH (ref 70–99)
Phosphorus: 3.2 mg/dL (ref 2.3–4.6)
Potassium: 4.4 mEq/L (ref 3.5–5.1)
Sodium: 138 mEq/L (ref 135–145)

## 2013-10-27 LAB — HEMOGLOBIN A1C: Hgb A1c MFr Bld: 6.7 % — ABNORMAL HIGH (ref 4.6–6.5)

## 2013-10-27 LAB — FERRITIN: Ferritin: 52.4 ng/mL (ref 22.0–322.0)

## 2013-10-27 LAB — LDL CHOLESTEROL, DIRECT: Direct LDL: 107.2 mg/dL

## 2013-10-28 ENCOUNTER — Other Ambulatory Visit: Payer: Medicare Other

## 2013-11-04 ENCOUNTER — Encounter: Payer: Medicare Other | Admitting: Family Medicine

## 2013-11-06 ENCOUNTER — Ambulatory Visit (INDEPENDENT_AMBULATORY_CARE_PROVIDER_SITE_OTHER): Payer: Medicare Other | Admitting: Family Medicine

## 2013-11-06 ENCOUNTER — Encounter: Payer: Self-pay | Admitting: Family Medicine

## 2013-11-06 VITALS — BP 138/80 | HR 76 | Temp 97.7°F | Ht 69.0 in | Wt 185.5 lb

## 2013-11-06 DIAGNOSIS — Z0000 Encounter for general adult medical examination without abnormal findings: Secondary | ICD-10-CM | POA: Diagnosis not present

## 2013-11-06 DIAGNOSIS — F3289 Other specified depressive episodes: Secondary | ICD-10-CM | POA: Diagnosis not present

## 2013-11-06 DIAGNOSIS — N183 Chronic kidney disease, stage 3 unspecified: Secondary | ICD-10-CM

## 2013-11-06 DIAGNOSIS — F32A Depression, unspecified: Secondary | ICD-10-CM

## 2013-11-06 DIAGNOSIS — K1370 Unspecified lesions of oral mucosa: Secondary | ICD-10-CM

## 2013-11-06 DIAGNOSIS — E785 Hyperlipidemia, unspecified: Secondary | ICD-10-CM | POA: Diagnosis not present

## 2013-11-06 DIAGNOSIS — E1140 Type 2 diabetes mellitus with diabetic neuropathy, unspecified: Secondary | ICD-10-CM

## 2013-11-06 DIAGNOSIS — F329 Major depressive disorder, single episode, unspecified: Secondary | ICD-10-CM

## 2013-11-06 DIAGNOSIS — I6350 Cerebral infarction due to unspecified occlusion or stenosis of unspecified cerebral artery: Secondary | ICD-10-CM | POA: Diagnosis not present

## 2013-11-06 DIAGNOSIS — Z23 Encounter for immunization: Secondary | ICD-10-CM

## 2013-11-06 DIAGNOSIS — G4733 Obstructive sleep apnea (adult) (pediatric): Secondary | ICD-10-CM

## 2013-11-06 DIAGNOSIS — G8191 Hemiplegia, unspecified affecting right dominant side: Secondary | ICD-10-CM

## 2013-11-06 MED ORDER — METOPROLOL TARTRATE 25 MG PO TABS: 25.0000 mg | ORAL_TABLET | Freq: Two times a day (BID) | ORAL | Status: DC | PRN

## 2013-11-06 MED ORDER — TRAZODONE 25 MG HALF TABLET: 25.0000 mg | ORAL_TABLET | Freq: Every day | ORAL | Status: DC

## 2013-11-06 NOTE — Patient Instructions (Signed)
prevnar today. Start trazodone 25mg  at bedtime for sleep and mood. Sign one more release for records from Dr. [name] in [facility] for latest colonoscopy (2012) Bring me copy of advanced directive. Return in 3-4 months for blood work to recheck cholesterol and kidneys Good to see you today, call us with questions.

## 2013-11-06 NOTE — Assessment & Plan Note (Addendum)
I have personally reviewed the Medicare Annual Wellness questionnaire and have noted 1. The patient's medical and social history 2. Their use of alcohol, tobacco or illicit drugs 3. Their current medications and supplements 4. The patient's functional ability including ADL's, fall risks, home safety risks and hearing or visual impairment. 5. Diet and physical activity 6. Evidence for depression or mood disorders The patients weight, height, BMI have been recorded in the chart.  Hearing and vision has been addressed. I have made referrals, counseling and provided education to the patient based review of the above and I have provided the pt with a written personalized care plan for preventive services. See scanned questionairre. Advanced directives discussed: pt will bring me a copy of his advanced directive.  Reviewed preventative protocols and updated unless pt declined. Per pt UTD colonoscopy - have again requested records from Dr. [name] of latest colonoscopy. Prostate per urology

## 2013-11-06 NOTE — Assessment & Plan Note (Addendum)
Elevated readings over last several months - discussed concern for CKD. Will need to monitor and if Cr persistently 1.5, discussed need to change metformin. Recheck in 3 mo along with SPEP, consider renal US. Anticipate diabetic nephropathy related, and pt also has h/o injury to L kidney - laceration after accident.

## 2013-11-06 NOTE — Progress Notes (Signed)
Pre-visit discussion using our clinic review tool. No additional management support is needed unless otherwise documented below in the visit note.  

## 2013-11-06 NOTE — Assessment & Plan Note (Signed)
Deteriorated.  Continue cymbalta and add trazodone nightly prn sleep and as adjuvant to cymbalta.

## 2013-11-06 NOTE — Assessment & Plan Note (Signed)
Followed by endo Dr. [name]. Lab Results  Component Value Date   HGBA1C 6.7* 10/27/2013  compliant with janumet and lantus.

## 2013-11-06 NOTE — Assessment & Plan Note (Signed)
Compliant with CPAP machine nightly.

## 2013-11-06 NOTE — Assessment & Plan Note (Signed)
Deteriorated control - pt endorses indiscretions with diet over last few months as well as not as regular with lipitor as he should be. Discussed 3 months of diet and lifestyle changes as well as restarting lipitor regularly. Return after this for labwork.

## 2013-11-06 NOTE — Assessment & Plan Note (Signed)
Actually pt endorses biting his inner cheek recently, anticipate due to this.  Advised monitor and update me or see dentist if lesions persists and does not heal as expected.

## 2013-11-06 NOTE — Progress Notes (Signed)
BP 138/80  Pulse 76  Temp(Src) 97.7 F (36.5 C) (Oral)  Ht 5\' 9"  (1.753 m)  Wt 185 lb 8 oz (84.142 kg)  BMI 27.38 kg/m2   CC: medicare wellness visit  Subjective:    Patient ID: James Bell, male    DOB: 1931/07/03, 80 y.o.   MRN: [id number]  HPI: James Bell is a 80 y.o. male presenting on 11/06/2013 with Annual Exam  Over last few days having some chest wall muscle pains.  No increased dyspnea with exertion.  Some malaise today.  Some imbalance.  Worsening depressed mood.  Mild dyspnea worse at night time.  No trouble during exercise at gym.  Wt Readings from Last 3 Encounters:  11/06/13 185 lb 8 oz (84.142 kg)  10/13/13 181 lb (82.101 kg)  08/22/13 187 lb 3.2 oz (84.913 kg)  Body mass index is 27.38 kg/(m^2).  Passes hearing screen. Vision screen - R side 20/50. 1 fall in last year - mechanical, tripped over cane, no injury. PHQ9 = 8.  On cymbalta 60mg  daily.  Feels mood could be better.  Preventative:  Colonoscopy - 05/2011 - normal per pt (Dr. [name], [place], [name]) No record of colonoscopy received.  Prostate - checked by urology Flu shot - 05/2013 Pneumovax - 09/2007.  prevnar today. Shingles shot - 09/2007  Tetanus - 05/2005  Advanced directives: Has advanced directives at home. Will bring copy for chart. Does have HCPOA.  Some trouble with IADLs 2/2 stroke.   Caffeine: 1 cup coffee/day  Occupation: ophthalmologist, retired Edu: MD Activity: going to gym 3x/wk with trainer Diet: some water, fruits/vegetables some, red meat a few times, fish 1x/wk   Relevant past medical, surgical, family and social history reviewed and updated. Allergies and medications reviewed and updated. Current Outpatient Prescriptions on File Prior to Visit  Medication Sig  . atorvastatin (LIPITOR) 40 MG tablet Take 40 mg by mouth daily.  . cetirizine (ZYRTEC) 10 MG tablet Take 10 mg by mouth as needed for allergies.  Marland Kitchen clopidogrel (PLAVIX) 75 MG tablet TAKE 1 TABLET  DAILY  . Coenzyme Q10 (COQ10) 150 MG CAPS Take 1 capsule by mouth 2 (two) times daily.  . diazepam (VALIUM) 5 MG tablet Take 5 mg by mouth every 8 (eight) hours as needed for anxiety.  . DULoxetine (CYMBALTA) 60 MG capsule Take 60 mg by mouth at bedtime.  . fish oil-omega-3 fatty acids 1000 MG capsule Take 2 g by mouth 2 (two) times daily.  . Folic Acid-Vit Q000111Q 123456 (FOLBEE) 2.5-25-1 MG TABS tablet Take 1 tablet by mouth daily.  Marland Kitchen LANTUS SOLOSTAR 100 UNIT/ML Solostar Pen INJECT 50 UNITS UNDER THE SKIN AT BEDTIME  . pramipexole (MIRAPEX) 1 MG tablet TAKE 1 TABLET AT BEDTIME  . SitaGLIPtin-MetFORMIN HCl 50-1000 MG TB24 Take 1 tablet by mouth 2 (two) times daily before a meal.  . vitamin C (ASCORBIC ACID) 500 MG tablet Take 1,000 mg by mouth daily.    No current facility-administered medications on file prior to visit.    Review of Systems  Constitutional: Negative for fever, chills, activity change, appetite change, fatigue and unexpected weight change.  HENT: Negative for hearing loss.   Eyes: Positive for visual disturbance (noted trouble R eye on screen today - pt endorses blurry vision and will schedule appt with Dr. Bing Plume).  Respiratory: Positive for shortness of breath. Negative for cough, chest tightness and wheezing.   Cardiovascular: Positive for leg swelling. Negative for chest pain and palpitations.  Gastrointestinal:  Positive for diarrhea. Negative for nausea, vomiting, abdominal pain, constipation, blood in stool and abdominal distention.  Genitourinary: Negative for hematuria and difficulty urinating.  Musculoskeletal: Negative for arthralgias, myalgias and neck pain.  Skin: Negative for rash.  Neurological: Negative for dizziness, seizures, syncope and headaches.  Hematological: Negative for adenopathy. Does not bruise/bleed easily.  Psychiatric/Behavioral: Negative for dysphoric mood. The patient is not nervous/anxious.    Per HPI unless specifically indicated above      Objective:    BP 138/80  Pulse 76  Temp(Src) 97.7 F (36.5 C) (Oral)  Ht 5\' 9"  (1.753 m)  Wt 185 lb 8 oz (84.142 kg)  BMI 27.38 kg/m2  Physical Exam  Nursing note and vitals reviewed. Constitutional: He is oriented to person, place, and time. He appears well-developed and well-nourished. No distress.  HENT:  Head: Normocephalic and atraumatic.  Right Ear: Hearing, tympanic membrane, external ear and ear canal normal.  Left Ear: Hearing, tympanic membrane, external ear and ear canal normal.  Nose: Nose normal.  Mouth/Throat: Uvula is midline, oropharynx is clear and moist and mucous membranes are normal. No oropharyngeal exudate, posterior oropharyngeal edema or posterior oropharyngeal erythema.  Dark lesion in mouth R posterior inner cheek  Eyes: Conjunctivae and EOM are normal. Pupils are equal, round, and reactive to light. No scleral icterus.  Neck: Normal range of motion. Neck supple. Carotid bruit is not present.  Cardiovascular: Normal rate, regular rhythm, normal heart sounds and intact distal pulses.   No murmur heard. Pulses:      Radial pulses are 2+ on the right side, and 2+ on the left side.  Pulmonary/Chest: Effort normal and breath sounds normal. No respiratory distress. He has no wheezes. He has no rales.  Abdominal: Soft. Bowel sounds are normal. He exhibits no distension and no mass. There is no tenderness. There is no rebound and no guarding.  Musculoskeletal: Normal range of motion. He exhibits no edema.  Lymphadenopathy:    He has no cervical adenopathy.  Neurological: He is alert and oriented to person, place, and time.  Residual mild R hemiparesis Normal serial 7s Recall 3/3  Skin: Skin is warm and dry. No rash noted.  Psychiatric: He has a normal mood and affect. His behavior is normal. Judgment and thought content normal.   Results for orders placed in visit on 10/27/13  LIPID PANEL      Result Value Ref Range   Cholesterol 160  0 - 200 mg/dL    Triglycerides 218.0 (*) 0.0 - 149.0 mg/dL   HDL 19.20 (*) >39.00 mg/dL   VLDL 43.6 (*) 0.0 - 40.0 mg/dL   Total CHOL/HDL Ratio 8    CBC WITH DIFFERENTIAL      Result Value Ref Range   WBC 10.9 (*) 4.5 - 10.5 K/uL   RBC 4.19 (*) 4.22 - 5.81 Mil/uL   Hemoglobin 12.6 (*) 13.0 - 17.0 g/dL   HCT 37.7 (*) 39.0 - 52.0 %   MCV 90.0  78.0 - 100.0 fl   MCHC 33.4  30.0 - 36.0 g/dL   RDW 13.6  11.5 - 14.6 %   Platelets 308.0  150.0 - 400.0 K/uL   Neutrophils Relative % 67.9  43.0 - 77.0 %   Lymphocytes Relative 23.9  12.0 - 46.0 %   Monocytes Relative 6.5  3.0 - 12.0 %   Eosinophils Relative 1.5  0.0 - 5.0 %   Basophils Relative 0.2  0.0 - 3.0 %   Neutro Abs 7.4  1.4 -  7.7 K/uL   Lymphs Abs 2.6  0.7 - 4.0 K/uL   Monocytes Absolute 0.7  0.1 - 1.0 K/uL   Eosinophils Absolute 0.2  0.0 - 0.7 K/uL   Basophils Absolute 0.0  0.0 - 0.1 K/uL  HEMOGLOBIN A1C      Result Value Ref Range   Hemoglobin A1C 6.7 (*) 4.6 - 6.5 %  FERRITIN      Result Value Ref Range   Ferritin 52.4  22.0 - 322.0 ng/mL  VITAMIN B12      Result Value Ref Range   Vitamin B-12 1324 (*) 211 - 911 pg/mL  RENAL FUNCTION PANEL      Result Value Ref Range   Sodium 138  135 - 145 mEq/L   Potassium 4.4  3.5 - 5.1 mEq/L   Chloride 103  96 - 112 mEq/L   CO2 26  19 - 32 mEq/L   Calcium 9.0  8.4 - 10.5 mg/dL   Albumin 3.5  3.5 - 5.2 g/dL   BUN 23  6 - 23 mg/dL   Creatinine, Ser 1.4  0.4 - 1.5 mg/dL   Glucose, Bld 116 (*) 70 - 99 mg/dL   Phosphorus 3.2  2.3 - 4.6 mg/dL   GFR 50.94 (*) >60.00 mL/min  LDL CHOLESTEROL, DIRECT      Result Value Ref Range   Direct LDL 107.2        Assessment & Plan:   Problem List Items Addressed This Visit   CKD (chronic kidney disease) stage 3, GFR 30-59 ml/min     Elevated readings over last several months - discussed concern for CKD. Will need to monitor and if Cr persistently 1.5, discussed need to change metformin. Recheck in 3 mo along with SPEP, consider renal US. Anticipate diabetic  nephropathy related, and pt also has h/o injury to L kidney - laceration after accident.    Relevant Orders      Renal function panel      Protein electrophoresis, serum   Depression     Deteriorated.  Continue cymbalta and add trazodone nightly prn sleep and as adjuvant to cymbalta.    Relevant Medications      traZODone (DESYREL) tablet   HLD (hyperlipidemia)     Deteriorated control - pt endorses indiscretions with diet over last few months as well as not as regular with lipitor as he should be. Discussed 3 months of diet and lifestyle changes as well as restarting lipitor regularly. Return after this for labwork.    Relevant Medications      metoprolol tartrate (LOPRESSOR) tablet   Other Relevant Orders      Lipid panel   Medicare annual wellness visit, subsequent - Primary     I have personally reviewed the Medicare Annual Wellness questionnaire and have noted 1. The patient's medical and social history 2. Their use of alcohol, tobacco or illicit drugs 3. Their current medications and supplements 4. The patient's functional ability including ADL's, fall risks, home safety risks and hearing or visual impairment. 5. Diet and physical activity 6. Evidence for depression or mood disorders The patients weight, height, BMI have been recorded in the chart.  Hearing and vision has been addressed. I have made referrals, counseling and provided education to the patient based review of the above and I have provided the pt with a written personalized care plan for preventive services. See scanned questionairre. Advanced directives discussed: pt will bring me a copy of his advanced directive.  Reviewed preventative protocols  and updated unless pt declined. Per pt UTD colonoscopy - have again requested records from Dr. [name] of latest colonoscopy. Prostate per urology    Relevant Orders      Pneumococcal conjugate vaccine 13-valent (Completed)   Oral lesion     Actually pt endorses  biting his inner cheek recently, anticipate due to this.  Advised monitor and update me or see dentist if lesions persists and does not heal as expected.    OSA (obstructive sleep apnea)     Compliant with CPAP machine nightly.    Right hemiparesis   Type 2 diabetes, controlled, with neuropathy      Followed by endo Dr. [name]. Lab Results  Component Value Date   HGBA1C 6.7* 10/27/2013  compliant with janumet and lantus.     Other Visit Diagnoses   Immunization due        Relevant Orders       Pneumococcal conjugate vaccine 13-valent (Completed)        Follow up plan: Return as needed, for lab work.

## 2013-11-07 ENCOUNTER — Ambulatory Visit (HOSPITAL_BASED_OUTPATIENT_CLINIC_OR_DEPARTMENT_OTHER): Payer: Medicare Other | Admitting: Physical Medicine & Rehabilitation

## 2013-11-07 ENCOUNTER — Encounter: Payer: Medicare Other | Attending: Physical Medicine & Rehabilitation

## 2013-11-07 ENCOUNTER — Telehealth: Payer: Self-pay

## 2013-11-07 ENCOUNTER — Encounter: Payer: Self-pay | Admitting: Physical Medicine & Rehabilitation

## 2013-11-07 VITALS — BP 146/64 | HR 88 | Resp 14 | Ht 69.0 in | Wt 187.0 lb

## 2013-11-07 DIAGNOSIS — G8110 Spastic hemiplegia affecting unspecified side: Secondary | ICD-10-CM | POA: Diagnosis not present

## 2013-11-07 DIAGNOSIS — M79609 Pain in unspecified limb: Secondary | ICD-10-CM | POA: Diagnosis not present

## 2013-11-07 DIAGNOSIS — I69959 Hemiplegia and hemiparesis following unspecified cerebrovascular disease affecting unspecified side: Secondary | ICD-10-CM | POA: Diagnosis not present

## 2013-11-07 DIAGNOSIS — M25819 Other specified joint disorders, unspecified shoulder: Secondary | ICD-10-CM | POA: Diagnosis not present

## 2013-11-07 DIAGNOSIS — M7580 Other shoulder lesions, unspecified shoulder: Secondary | ICD-10-CM

## 2013-11-07 NOTE — Patient Instructions (Signed)
Injection today he may have some soreness around the right axilla. May have numbness at the right lateral forearm

## 2013-11-07 NOTE — Telephone Encounter (Signed)
Relevant patient education assigned to patient using Emmi. ° °

## 2013-11-07 NOTE — Progress Notes (Signed)
Subjective:    Patient ID: James Bell, male    DOB: January 07, 1931, 80 y.o.   MRN: [id number]  HPI Good relaxation of hand and wrist flexors on the right side after Botox injection 10/13/2013  Still has biceps spasticity Pain Inventory Average Pain 0 Pain Right Now 0 My pain is no pain  In the last 24 hours, has pain interfered with the following? General activity 0 Relation with others 0 Enjoyment of life 0 What TIME of day is your pain at its worst? no pain Sleep (in general) Fair  Pain is worse with: no pain Pain improves with: no pain Relief from Meds: no pain meds  Mobility walk with assistance use a cane how many minutes can you walk? 5  Function retired  Neuro/Psych No problems in this area  Prior Studies Any changes since last visit?  no  Physicians involved in your care Any changes since last visit?  no   Family History  Problem Relation Age of Onset  . Stroke Father   . Lung cancer Father 93     smoker  . Stroke Brother   . Diabetes Brother 40  . Coronary artery disease Neg Hx   . Lung cancer Brother   . Alcoholism Sister   . Kidney disease Sister    History   Social History  . Marital Status: Unknown    Spouse Name: N/A    Number of Children: 2  . Years of Education: MD   Occupational History  . retired   .     Social History Main Topics  . Smoking status: Never Smoker   . Smokeless tobacco: Never Used     Comment: "used to smoke pipe when I was younger"  . Alcohol Use: 2.4 oz/week    2 Glasses of wine, 2 Shots of liquor per week     Comment: occasional  . Drug Use: No  . Sexual Activity: Yes   Other Topics Concern  . None   Social History Narrative   Caffeine: 1 cup coffee/day   Divorced   Occupation: ophthalmologist   Edu: MD   Activity: rehab s/p CVA   Diet: some water, fruits/vegetables daily   POA - [name] (Daughter)   Past Surgical History  Procedure Laterality Date  . Prostate surgery  02/2009    PVP (laser)  . Tonsillectomy and adenoidectomy  ~ 1945  . Hemorrhoid surgery  1991  . Cataract extraction w/ intraocular lens implant  11//2012 - 08/2011  . Cardiac catheterization  2004    normal  . Inguinal hernia repair  1981; 2002    left, then repaired  . Skin cancer excision  2002    bridge of nose, BCC  . Colonoscopy  05/2011    normal per pt (Dr. [name], [place], [name])  . Mri brain  04/2010    WNL  . Esophagogastroduodenoscopy  2006    duodenitis, medual HH, Grade 2 reflux, mild gastritis  . Colonoscopy  05/2011    no records received from Dr. [name]  . Abi  2007    WNL  . Urethral dilation  2014    tannenbaum  . Cystoscopy with urethral dilatation N/A 07/24/2013    Procedure: CYSTOSCOPY WITH URETHRAL DILATATION;  Surgeon: [name], MD;  Location: [facility];  Service: Urology;  Laterality: N/A;  . Transurethral incision of prostate N/A 07/24/2013    Procedure: TRANSURETHRAL INCISION OF THE PROSTATE (TUIP);  Surgeon: [name], MD;  Location: [facility];  Service: Urology;  Laterality: N/A;   Past Medical History  Diagnosis Date  . HLD (hyperlipidemia)   . Restless leg syndrome     well controlled on mirapex  . OSA on CPAP     severe, CPAP at 12, previously on nuvigil (Dr. Rexene Alberts)  . Type 2 diabetes, controlled, with neuropathy 2011  . H/O hiatal hernia   . GERD (gastroesophageal reflux disease)     with sliding HH  . Migraines 03/11/12    now rare  . CVA (cerebral infarction) 03/07/12    slurred speech; right hemplegia, left handed  . Basal cell carcinoma of nose 2002    bridge of nose  . History of kidney problems 12/2010    lacerated kidney after fall  . BPH with obstruction/lower urinary tract symptoms     failed flomax, uroxatral, myrbetriq - sees urology [name]) pending videourodynamics  . Depression     mild, on cymbalta per prior PCP  . Palpitations 01/2012    holter WNL, rare PAC/PVCs  . Anemia   . Erectile dysfunction     per  urology (prostaglandin injection)   BP 146/64  Pulse 88  Resp 14  Ht 5\' 9"  (1.753 m)  Wt 187 lb (84.823 kg)  BMI 27.60 kg/m2  SpO2 99%  Opioid Risk Score:   Fall Risk Score: Moderate Fall Risk (6-13 points) (pt educated on fall risk, brochure given to pt.)   Review of Systems  All other systems reviewed and are negative.       Objective:   Physical Exam        Assessment & Plan:  Phenol neurolysis of the Right  musculo- cutaneous nerve  Indication: Severe spasticity in the arm flexor muscles which is not responding to medical management and other conservative care and interfering with functional use and hygiene.  Informed consent was obtained after describing the risks and benefits of the procedure with the patient this includes bleeding bruising and infection as well as medication side effects. The patient elected to proceed and has given written consent. Patient placed in a supine position on the exam table. External DC stimulation was applied to the axilla using a nerve stimulator. Arm flexion twitch was obtained. The axillary region was prepped with Betadine and then entered with a 22-gauge 40 mm needle electrode under electrical stimulation guidance. Arm flexion which was obtained and confirmed. Then 4 cc of 5% phenol were injected. The patient tolerated procedure well. Post procedure instructions and followup visit were given. RTC one mo

## 2013-11-11 ENCOUNTER — Ambulatory Visit: Payer: Medicare Other | Admitting: Physical Medicine & Rehabilitation

## 2013-11-19 ENCOUNTER — Telehealth: Payer: Self-pay | Admitting: *Deleted

## 2013-11-19 NOTE — Telephone Encounter (Signed)
Mailed copy of cartoid doppler results for pts record.

## 2013-11-23 ENCOUNTER — Encounter: Payer: Self-pay | Admitting: Family Medicine

## 2013-12-09 ENCOUNTER — Ambulatory Visit: Payer: Medicare Other | Admitting: Physical Medicine & Rehabilitation

## 2013-12-22 ENCOUNTER — Ambulatory Visit (HOSPITAL_BASED_OUTPATIENT_CLINIC_OR_DEPARTMENT_OTHER): Payer: Medicare Other | Admitting: Physical Medicine & Rehabilitation

## 2013-12-22 ENCOUNTER — Encounter: Payer: Self-pay | Admitting: Physical Medicine & Rehabilitation

## 2013-12-22 ENCOUNTER — Encounter: Payer: Medicare Other | Attending: Physical Medicine & Rehabilitation

## 2013-12-22 VITALS — BP 138/64 | HR 103 | Resp 14 | Ht 68.0 in | Wt 190.0 lb

## 2013-12-22 DIAGNOSIS — I6350 Cerebral infarction due to unspecified occlusion or stenosis of unspecified cerebral artery: Secondary | ICD-10-CM

## 2013-12-22 DIAGNOSIS — M7580 Other shoulder lesions, unspecified shoulder: Secondary | ICD-10-CM

## 2013-12-22 DIAGNOSIS — G8110 Spastic hemiplegia affecting unspecified side: Secondary | ICD-10-CM | POA: Diagnosis not present

## 2013-12-22 DIAGNOSIS — M25819 Other specified joint disorders, unspecified shoulder: Secondary | ICD-10-CM | POA: Insufficient documentation

## 2013-12-22 DIAGNOSIS — M79609 Pain in unspecified limb: Secondary | ICD-10-CM | POA: Insufficient documentation

## 2013-12-22 DIAGNOSIS — I69959 Hemiplegia and hemiparesis following unspecified cerebrovascular disease affecting unspecified side: Secondary | ICD-10-CM | POA: Insufficient documentation

## 2013-12-22 NOTE — Progress Notes (Signed)
Subjective:    Patient ID: James Bell, male    DOB: 02-08-1931, 80 y.o.   MRN: [id number]  HPI Good relaxation of hand and wrist flexors on the right side after Botox injection 10/13/2013 Phenol neuro lysis right muscular cutaneous nerve was helpful for biceps spasticity Has flexion at the right hand fourth and fifth digits  No forearm numbness following last injection Pain Inventory Average Pain 0 Pain Right Now 0 My pain is no pain  In the last 24 hours, has pain interfered with the following? General activity 0 Relation with others 0 Enjoyment of life 0 What TIME of day is your pain at its worst? no pain Sleep (in general) Fair  Pain is worse with: no pain Pain improves with: no pain Relief from Meds: no pain meds  Mobility walk with assistance use a cane ability to climb steps?  yes do you drive?  yes  Function Do you have any goals in this area?  no  Neuro/Psych weakness  Prior Studies Any changes since last visit?  no  Physicians involved in your care Any changes since last visit?  no   Family History  Problem Relation Age of Onset  . Stroke Father   . Lung cancer Father 64     smoker  . Stroke Brother   . Diabetes Brother 35  . Coronary artery disease Neg Hx   . Lung cancer Brother   . Alcoholism Sister   . Kidney disease Sister    History   Social History  . Marital Status: Unknown    Spouse Name: N/A    Number of Children: 2  . Years of Education: MD   Occupational History  . retired   .     Social History Main Topics  . Smoking status: Never Smoker   . Smokeless tobacco: Never Used     Comment: "used to smoke pipe when I was younger"  . Alcohol Use: 2.4 oz/week    2 Glasses of wine, 2 Shots of liquor per week     Comment: occasional  . Drug Use: No  . Sexual Activity: Yes   Other Topics Concern  . None   Social History Narrative   Caffeine: 1 cup coffee/day   Divorced   Occupation: ophthalmologist   Edu: MD   Activity: rehab s/p CVA   Diet: some water, fruits/vegetables daily   POA - [name] (Daughter)   Past Surgical History  Procedure Laterality Date  . Prostate surgery  02/2009    PVP (laser)  . Tonsillectomy and adenoidectomy  ~ 1945  . Hemorrhoid surgery  1991  . Cataract extraction w/ intraocular lens implant  11//2012 - 08/2011  . Cardiac catheterization  2004    normal  . Inguinal hernia repair  1981; 2002    left, then repaired  . Skin cancer excision  2002    bridge of nose, BCC  . Colonoscopy  05/2011    normal per pt (Dr. [name], [place], [name])  . Mri brain  04/2010    WNL  . Esophagogastroduodenoscopy  2006    duodenitis, medual HH, Grade 2 reflux, mild gastritis  . Colonoscopy  03/2011    poor prep, unable to biopsy polyp in tic fold, rpt 3 mo [name])  . Abi  2007    WNL  . Urethral dilation  2014    tannenbaum  . Cystoscopy with urethral dilatation N/A 07/24/2013    Procedure: CYSTOSCOPY WITH URETHRAL DILATATION;  Surgeon: [name], MD;  Location: [facility];  Service: Urology;  Laterality: N/A;  . Transurethral incision of prostate N/A 07/24/2013    Procedure: TRANSURETHRAL INCISION OF THE PROSTATE (TUIP);  Surgeon: [name], MD;  Location: [facility];  Service: Urology;  Laterality: N/A;  . Esophagogastroduodenoscopy  07/2011    mild chronic gastritis  . Colonoscopy  06/2011    mult polyps adenomatous, severe diverticulosis, rpt 3 yrs [name])   Past Medical History  Diagnosis Date  . HLD (hyperlipidemia)   . Restless leg syndrome     well controlled on mirapex  . OSA on CPAP     severe, CPAP at 12, previously on nuvigil (Dr. Rexene Alberts)  . Type 2 diabetes, controlled, with neuropathy 2011  . H/O hiatal hernia   . GERD (gastroesophageal reflux disease)     with sliding HH  . Migraines 03/11/12    now rare  . CVA (cerebral infarction) 03/07/12    slurred speech; right hemplegia, left handed  . Basal cell carcinoma of nose 2002    bridge of  nose  . History of kidney problems 12/2010    lacerated kidney after fall  . BPH with obstruction/lower urinary tract symptoms     failed flomax, uroxatral, myrbetriq - sees urology [name]) pending videourodynamics  . Depression     mild, on cymbalta per prior PCP  . Palpitations 01/2012    holter WNL, rare PAC/PVCs  . Anemia   . Erectile dysfunction     per urology (prostaglandin injection)   BP 138/64  Pulse 103  Resp 14  Ht 5\' 8"  (1.727 m)  Wt 190 lb (86.183 kg)  BMI 28.90 kg/m2  SpO2 97%  Opioid Risk Score:   Fall Risk Score: Moderate Fall Risk (6-13 points) (pt educated and given brochure on fall risk previously)    Review of Systems  Neurological: Positive for weakness.  All other systems reviewed and are negative.       Objective:   Physical Exam  Right fourth and fifth flexion spasticity at the MCP greater than PIP, no DIP flexion, Ashworth grade 3 spasticity in the fourth and fifth digits Ashworth grade 1 spasticity at the first second and third digits Ashworth grade 1 spasticity at the right biceps      Assessment & Plan:  1. Right spastic hemiparesis secondary to CVA, good improvement in spasticity after biceps/MC neuro lysis with phenol.   Will need to repeat Botox injection with 150 units divided between the lumbricals in the fourth and fifth digits as well as the right FCR as well as right FDP medial aspect

## 2014-01-02 ENCOUNTER — Telehealth: Payer: Self-pay | Admitting: Neurology

## 2014-01-02 NOTE — Telephone Encounter (Signed)
Called pt to cancel apt pt states he hasn't had a sleep study and needs one and has had other changes. Pt needs a sooner apt than end of July. Please call pt to set up sooner. Thanks

## 2014-01-05 ENCOUNTER — Ambulatory Visit: Payer: Medicare Other | Admitting: Nurse Practitioner

## 2014-01-05 NOTE — Telephone Encounter (Signed)
I am unsure if patient was ever setup on auto-titration.  We have seen no download and he is stating he needs a sleep study.  His previous studies were old and that may have been an issue.  We can certainly accomodate [name] with a sleep study so he can get what he needs as far as equipment and supplies if that is an issue.  He is Medicare age and he would require a face to face visit in the past 6 months however or else he will have to come in for this visit and then he can be scheduled for a sleep study at that time.

## 2014-01-05 NOTE — Telephone Encounter (Signed)
[place], please see phone note dated 07/30/2013.  I am unsure what this patient needs at this point.  Does he need an auto-titration or a sleep study??

## 2014-01-06 DIAGNOSIS — M949 Disorder of cartilage, unspecified: Secondary | ICD-10-CM | POA: Diagnosis not present

## 2014-01-06 DIAGNOSIS — M899 Disorder of bone, unspecified: Secondary | ICD-10-CM | POA: Diagnosis not present

## 2014-01-06 DIAGNOSIS — E786 Lipoprotein deficiency: Secondary | ICD-10-CM | POA: Diagnosis not present

## 2014-01-06 DIAGNOSIS — R946 Abnormal results of thyroid function studies: Secondary | ICD-10-CM | POA: Diagnosis not present

## 2014-01-06 DIAGNOSIS — E785 Hyperlipidemia, unspecified: Secondary | ICD-10-CM | POA: Diagnosis not present

## 2014-01-06 DIAGNOSIS — B353 Tinea pedis: Secondary | ICD-10-CM | POA: Diagnosis not present

## 2014-01-06 DIAGNOSIS — L851 Acquired keratosis [keratoderma] palmaris et plantaris: Secondary | ICD-10-CM | POA: Diagnosis not present

## 2014-01-06 DIAGNOSIS — E236 Other disorders of pituitary gland: Secondary | ICD-10-CM | POA: Diagnosis not present

## 2014-01-06 DIAGNOSIS — E1149 Type 2 diabetes mellitus with other diabetic neurological complication: Secondary | ICD-10-CM | POA: Diagnosis not present

## 2014-01-06 DIAGNOSIS — Z79899 Other long term (current) drug therapy: Secondary | ICD-10-CM | POA: Diagnosis not present

## 2014-01-07 NOTE — Telephone Encounter (Signed)
I called pt and made appt with Dr. Rexene Alberts for Thursday 01-08-14 at 0930 since last seen 03-2013 with Dr. Rexene Alberts. Pt confirmed.

## 2014-01-08 ENCOUNTER — Encounter (INDEPENDENT_AMBULATORY_CARE_PROVIDER_SITE_OTHER): Payer: Self-pay

## 2014-01-08 ENCOUNTER — Ambulatory Visit (INDEPENDENT_AMBULATORY_CARE_PROVIDER_SITE_OTHER): Payer: Medicare Other | Admitting: Neurology

## 2014-01-08 ENCOUNTER — Encounter: Payer: Self-pay | Admitting: Neurology

## 2014-01-08 VITALS — BP 162/78 | HR 84 | Temp 96.1°F | Ht 68.0 in | Wt 184.0 lb

## 2014-01-08 DIAGNOSIS — E119 Type 2 diabetes mellitus without complications: Secondary | ICD-10-CM

## 2014-01-08 DIAGNOSIS — G4761 Periodic limb movement disorder: Secondary | ICD-10-CM

## 2014-01-08 DIAGNOSIS — G8190 Hemiplegia, unspecified affecting unspecified side: Secondary | ICD-10-CM

## 2014-01-08 DIAGNOSIS — G2581 Restless legs syndrome: Secondary | ICD-10-CM

## 2014-01-08 DIAGNOSIS — G4733 Obstructive sleep apnea (adult) (pediatric): Secondary | ICD-10-CM

## 2014-01-08 DIAGNOSIS — I6350 Cerebral infarction due to unspecified occlusion or stenosis of unspecified cerebral artery: Secondary | ICD-10-CM | POA: Diagnosis not present

## 2014-01-08 DIAGNOSIS — I639 Cerebral infarction, unspecified: Secondary | ICD-10-CM

## 2014-01-08 DIAGNOSIS — G8191 Hemiplegia, unspecified affecting right dominant side: Secondary | ICD-10-CM

## 2014-01-08 NOTE — Progress Notes (Signed)
Subjective:    Patient ID: James Bell is a 80 y.o. male.  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  I first met him on 04/04/2013 at which time I suggested an autoPAP trial for 30 days. His old CPAP machine was set for 10 cm, but was defective and he bought a new machine on his own, which was set at 7 cm, then increased to 9 cm, as I understand.  His typical bedtime is reported to be around 9 to 10 PM and usual wake time is around 5 AM. Sleep onset typically occurs within a few minutes. He reports feeling not well rested upon awakening. He wakes up on an average 2 in the middle of the night and has to go to the bathroom 2 times on a typical night. He denies recent morning headaches.  He reports excessive daytime somnolence (EDS) and His Epworth Sleepiness Score (ESS) is 15/24 today. He has been known to snore for the past many years. He had 2 sleep studies in the past and he has records today, which I reviewed: He had a baseline sleep study on 12/26/2007 at [facility] in [place], [place]. His overall AHI was 40 per hour. His oxygen nadir was 78%. Moderate to severe sleep fragmentation was noted and an increased arousal index. He had periodic leg movements of sleep. He had a CPAP titration study on 05/21/2008. He had periodic leg movements of sleep, oxygen nadir was 92% for the night.  He feels that the pressure is not adequate as he does not wake up rested and has daytime  somnolence. He feels that he may be snoring despite using CPAP. He had another machine which could not be fixed. He goes to the gym and drives a car. He lives alone and cooks for himself. He uses a 4 prong cane on the left side to mobilize. He is up for Botox injections in the right upper extremity.   His Past Medical History Is Significant For: Past Medical History  Diagnosis Date  . HLD (hyperlipidemia)   . Restless leg syndrome     well controlled on mirapex  . OSA on CPAP     severe, CPAP at 10, previously on nuvigil (Dr. Rexene Alberts)  . Type 2 diabetes, controlled, with neuropathy 2011  . H/O hiatal hernia   . GERD (gastroesophageal reflux disease)     with sliding HH  . Migraines 03/11/12    now rare  . CVA (cerebral infarction) 03/07/12    slurred speech; right hemplegia, left handed  . Basal cell carcinoma of nose 2002    bridge of nose  . History of kidney problems 12/2010    lacerated kidney after fall  . BPH with obstruction/lower urinary tract symptoms     failed flomax, uroxatral, myrbetriq - sees urology [name]) pending videourodynamics  . Depression     mild, on cymbalta per prior PCP  . Palpitations 01/2012    holter WNL, rare PAC/PVCs  . Anemia   . Erectile dysfunction  per urology (prostaglandin injection)    His Past Surgical History Is Significant For: Past Surgical History  Procedure Laterality Date  . Prostate surgery  02/2009    PVP (laser)  . Tonsillectomy and adenoidectomy  ~ 1945  . Hemorrhoid surgery  1991  . Cataract extraction w/ intraocular lens implant  11//2012 - 08/2011  . Cardiac catheterization  2004    normal  . Inguinal hernia repair  1981; 2002    left, then repaired  . Skin cancer excision  2002    bridge of nose, BCC  . Colonoscopy  05/2011    normal per pt (Dr. [name], [place], [name])  . Mri brain  04/2010    WNL  . Esophagogastroduodenoscopy  2006    duodenitis, medual HH, Grade 2 reflux, mild gastritis  . Colonoscopy   03/2011    poor prep, unable to biopsy polyp in tic fold, rpt 3 mo [name])  . Abi  2007    WNL  . Urethral dilation  2014    tannenbaum  . Cystoscopy with urethral dilatation N/A 07/24/2013    Procedure: CYSTOSCOPY WITH URETHRAL DILATATION;  Surgeon: [name], MD;  Location: [facility];  Service: Urology;  Laterality: N/A;  . Transurethral incision of prostate N/A 07/24/2013    Procedure: TRANSURETHRAL INCISION OF THE PROSTATE (TUIP);  Surgeon: [name], MD;  Location: [facility];  Service: Urology;  Laterality: N/A;  . Esophagogastroduodenoscopy  07/2011    mild chronic gastritis  . Colonoscopy  06/2011    mult polyps adenomatous, severe diverticulosis, rpt 3 yrs [name])    His Family History Is Significant For: Family History  Problem Relation Age of Onset  . Stroke Father   . Lung cancer Father 44     smoker  . Stroke Brother   . Diabetes Brother 97  . Coronary artery disease Neg Hx   . Lung cancer Brother   . Alcoholism Sister   . Kidney disease Sister     His Social History Is Significant For: History   Social History  . Marital Status: Unknown    Spouse Name: N/A    Number of Children: 2  . Years of Education: MD   Occupational History  . retired   .     Social History Main Topics  . Smoking status: Never Smoker   . Smokeless tobacco: Never Used     Comment: "used to smoke pipe when I was younger"  . Alcohol Use: 2.4 oz/week    2 Glasses of wine, 2 Shots of liquor per week     Comment: occasional  . Drug Use: No  . Sexual Activity: Yes   Other Topics Concern  . None   Social History Narrative   Caffeine: 1 cup coffee/day   Divorced   Occupation: ophthalmologist   Edu: MD   Activity: rehab s/p CVA   Diet: some water, fruits/vegetables daily   POA - [name] (Daughter)    His Allergies Are:  Allergies  Allergen Reactions  . Lisinopril Cough    tachycardia  . Nexium [Esomeprazole] Other (See Comments)    Headache  . Niacin  And Related Other (See Comments)    flushing  :   His Current Medications Are:  Outpatient Encounter Prescriptions as of 01/08/2014  Medication Sig  . atorvastatin (LIPITOR) 40 MG tablet Take 40 mg by mouth daily.  . cetirizine (ZYRTEC) 10 MG tablet Take 10 mg by mouth as needed for allergies.  Marland Kitchen  clopidogrel (PLAVIX) 75 MG tablet TAKE 1 TABLET DAILY  . Coenzyme Q10 (COQ10) 150 MG CAPS Take 1 capsule by mouth 2 (two) times daily.  . diazepam (VALIUM) 5 MG tablet Take 5 mg by mouth every 8 (eight) hours as needed for anxiety.  . DULoxetine (CYMBALTA) 60 MG capsule Take 60 mg by mouth at bedtime.  . fish oil-omega-3 fatty acids 1000 MG capsule Take 2 g by mouth 2 (two) times daily.  . Folic Acid-Vit Z3-YQM V78 (FOLBEE) 2.5-25-1 MG TABS tablet Take 1 tablet by mouth daily.  Marland Kitchen LANTUS SOLOSTAR 100 UNIT/ML Solostar Pen INJECT 50 UNITS UNDER THE SKIN AT BEDTIME  . metoprolol tartrate (LOPRESSOR) 25 MG tablet Take 1 tablet (25 mg total) by mouth 2 (two) times daily as needed (rapid heart rate).  Marland Kitchen omeprazole (PRILOSEC) 40 MG capsule Take 40 mg by mouth 2 (two) times daily as needed.  . pramipexole (MIRAPEX) 1 MG tablet TAKE 1 TABLET AT BEDTIME  . SitaGLIPtin-MetFORMIN HCl 50-1000 MG TB24 Take 1 tablet by mouth 2 (two) times daily before a meal.  . traZODone (DESYREL) 25 mg TABS tablet Take 0.5 tablets (25 mg total) by mouth at bedtime.  . vitamin C (ASCORBIC ACID) 500 MG tablet Take 1,000 mg by mouth daily.   :  Review of Systems:  Out of a complete 14 point review of systems, all are reviewed and negative with the exception of these symptoms as listed below:   Review of Systems  Constitutional: Positive for fatigue.  HENT: Positive for hearing loss.   Eyes: Positive for visual disturbance (blurred vision).  Respiratory: Negative.   Cardiovascular: Positive for leg swelling.  Gastrointestinal: Negative.   Endocrine: Negative.   Genitourinary:       Impotence  Musculoskeletal: Positive for  gait problem.  Skin: Negative.   Allergic/Immunologic: Negative.   Neurological: Positive for speech difficulty.  Hematological: Negative.   Psychiatric/Behavioral: Positive for sleep disturbance (e.d.s., restless leg).    Objective:  Neurologic Exam  Physical Exam  Physical Examination:   Filed Vitals:   01/08/14 0859  BP: 162/78  Pulse: 84  Temp: 96.1 F (35.6 C)   General Examination: The patient is a very pleasant 80 y.o. male in no acute distress. He appears well-developed and well-nourished and well groomed.   HEENT: Normocephalic, atraumatic, pupils are equal, round and reactive to light and accommodation. Extraocular tracking is good without limitation to gaze excursion or nystagmus noted. Normal smooth pursuit is noted. Hearing is grossly intact with hearing aid in place. Face is asymmetric with mild R lower facial weakness. Speech is minimally dysarthric and hypophonic. There is no lip, neck/head, jaw or voice tremor. Neck is supple with full range of passive and active motion. There are no carotid bruits on auscultation. Oropharynx exam reveals: mild mouth dryness, adequate dental hygiene and mild airway crowding.   Chest: Clear to auscultation without wheezing, rhonchi or crackles noted.  Heart: S1+S2+0, regular and normal without murmurs, rubs or gallops noted.   Abdomen: Soft, non-tender and non-distended with normal bowel sounds appreciated on auscultation.  Extremities: There is no pitting edema in the distal lower extremities bilaterally but he has mild puffiness in his distal legs. Pedal pulses are intact.   Skin: Warm and dry without trophic changes noted. There are no varicose veins.  Musculoskeletal: exam reveals no obvious joint deformities, tenderness or joint swelling or erythema.   Neurologically:  Mental status: The patient is awake, alert and oriented in all 4 spheres. His  memory, attention, language and knowledge are appropriate. There is no aphasia,  agnosia, apraxia or anomia. Speech is clear with normal prosody and enunciation. Thought process is linear. Mood is congruent and affect is normal.  Cranial nerves are as described above under HEENT exam. In addition, shoulder shrug is normal with equal shoulder height noted. Motor exam: Normal bulk, strength and tone is noted on the L. On the R, there is 4/5 weakness in the RUE with increase in tone and 4/5 weakness in the RLE with spasticity noted.  Sensory exam is intact to light touch, pinprick, vibration, temperature sense.  Gait, station and balance: he stands with mild difficulty and uses a 4 prong cane on the L with mild circumduction on the RLE and flexion of his RUE.    Assessment and Plan:   In summary, James Bell is a very pleasant 80 year old male with a history of Diabetes, migraine headaches, restless leg syndrome with PLMs, stroke in June 2013 with residual right-sided weakness, as well as a prior diagnosis of severe obstructive sleep apnea who has been using a CPAP machine that he bought himself set at a pressure of 9 cm, which he feels is no longer adequate. He endorses residual daytime somnolence and has gained approximately 5 pounds since his sleep studies in 2009. He is trying to lose weight. He exercises regularly. He reports residual snoring and nonrestorative sleep. He is advised that we should bring him back for a sleep study and take it from there. We will most likely do a split-night sleep study and get a baseline portion, followed by a CPAP titration portion during which we can properly adjust his pressure and mask fitting. He likes to use a nasal pillows mask. He rarely takes Ambien at home. He has taken it once this year he believes. I would like to see him back after the sleep study is completed. He is advised to continue followup with Dr. Leonie Man for his stroke management and with Dr. Letta Pate for Botox injections. We will try to get him a DME company locally in  [place].

## 2014-01-08 NOTE — Patient Instructions (Signed)
We will bring you back for a sleep study and get you adjusted with a new CPAP machine and proper mask and pressure.

## 2014-01-09 ENCOUNTER — Ambulatory Visit: Payer: Medicare Other | Admitting: Nurse Practitioner

## 2014-01-13 ENCOUNTER — Ambulatory Visit (INDEPENDENT_AMBULATORY_CARE_PROVIDER_SITE_OTHER): Payer: Medicare Other | Admitting: Neurology

## 2014-01-13 DIAGNOSIS — G2581 Restless legs syndrome: Secondary | ICD-10-CM

## 2014-01-13 DIAGNOSIS — G4733 Obstructive sleep apnea (adult) (pediatric): Secondary | ICD-10-CM

## 2014-01-13 DIAGNOSIS — G8191 Hemiplegia, unspecified affecting right dominant side: Secondary | ICD-10-CM

## 2014-01-13 DIAGNOSIS — G479 Sleep disorder, unspecified: Secondary | ICD-10-CM

## 2014-01-13 DIAGNOSIS — R9431 Abnormal electrocardiogram [ECG] [EKG]: Secondary | ICD-10-CM

## 2014-01-13 DIAGNOSIS — E119 Type 2 diabetes mellitus without complications: Secondary | ICD-10-CM

## 2014-01-13 DIAGNOSIS — I639 Cerebral infarction, unspecified: Secondary | ICD-10-CM

## 2014-01-21 DIAGNOSIS — E1149 Type 2 diabetes mellitus with other diabetic neurological complication: Secondary | ICD-10-CM | POA: Diagnosis not present

## 2014-01-27 ENCOUNTER — Encounter: Payer: Medicare Other | Attending: Physical Medicine & Rehabilitation

## 2014-01-27 ENCOUNTER — Ambulatory Visit (HOSPITAL_BASED_OUTPATIENT_CLINIC_OR_DEPARTMENT_OTHER): Payer: Medicare Other | Admitting: Physical Medicine & Rehabilitation

## 2014-01-27 ENCOUNTER — Encounter: Payer: Self-pay | Admitting: Physical Medicine & Rehabilitation

## 2014-01-27 VITALS — BP 137/67 | HR 84 | Resp 14 | Ht 68.0 in | Wt 186.0 lb

## 2014-01-27 DIAGNOSIS — G8110 Spastic hemiplegia affecting unspecified side: Secondary | ICD-10-CM | POA: Diagnosis not present

## 2014-01-27 DIAGNOSIS — I69959 Hemiplegia and hemiparesis following unspecified cerebrovascular disease affecting unspecified side: Secondary | ICD-10-CM | POA: Diagnosis not present

## 2014-01-27 DIAGNOSIS — M79609 Pain in unspecified limb: Secondary | ICD-10-CM | POA: Insufficient documentation

## 2014-01-27 DIAGNOSIS — M25819 Other specified joint disorders, unspecified shoulder: Secondary | ICD-10-CM | POA: Diagnosis not present

## 2014-01-27 DIAGNOSIS — M7580 Other shoulder lesions, unspecified shoulder: Secondary | ICD-10-CM

## 2014-01-27 NOTE — Progress Notes (Signed)
  Botox Injection for spasticity using needle EMG guidance  Dilution: 50 Units/ml Indication: Severe spasticity which interferes with ADL,mobility and/or  hygiene and is unresponsive to medication management and other conservative care Informed consent was obtained after describing risks and benefits of the procedure with the patient. This includes bleeding, bruising, infection, excessive weakness, or medication side effects. A REMS form is on file and signed. Needle: 27g 1 inch needle electrode Number of units per muscle   FCR50   FDP50 Lumbrical #3 25 Lumbrical #4 25 Adductor pollicis 25  Consider [id number] to R Brachioradialis next visit  All injections were done after obtaining appropriate EMG activity and after negative drawback for blood. The patient tolerated the procedure well. Post procedure instructions were given. A followup appointment was made.

## 2014-01-27 NOTE — Patient Instructions (Signed)

## 2014-02-03 ENCOUNTER — Ambulatory Visit (INDEPENDENT_AMBULATORY_CARE_PROVIDER_SITE_OTHER): Payer: Medicare Other | Admitting: Family Medicine

## 2014-02-03 ENCOUNTER — Encounter: Payer: Self-pay | Admitting: Family Medicine

## 2014-02-03 VITALS — BP 136/70 | HR 88 | Temp 98.1°F | Wt 185.0 lb

## 2014-02-03 DIAGNOSIS — E785 Hyperlipidemia, unspecified: Secondary | ICD-10-CM

## 2014-02-03 DIAGNOSIS — N183 Chronic kidney disease, stage 3 unspecified: Secondary | ICD-10-CM

## 2014-02-03 DIAGNOSIS — R0789 Other chest pain: Secondary | ICD-10-CM | POA: Insufficient documentation

## 2014-02-03 DIAGNOSIS — F32A Depression, unspecified: Secondary | ICD-10-CM

## 2014-02-03 DIAGNOSIS — I6350 Cerebral infarction due to unspecified occlusion or stenosis of unspecified cerebral artery: Secondary | ICD-10-CM | POA: Diagnosis not present

## 2014-02-03 DIAGNOSIS — E1149 Type 2 diabetes mellitus with other diabetic neurological complication: Secondary | ICD-10-CM | POA: Diagnosis not present

## 2014-02-03 DIAGNOSIS — F3289 Other specified depressive episodes: Secondary | ICD-10-CM

## 2014-02-03 DIAGNOSIS — E1140 Type 2 diabetes mellitus with diabetic neuropathy, unspecified: Secondary | ICD-10-CM

## 2014-02-03 DIAGNOSIS — F329 Major depressive disorder, single episode, unspecified: Secondary | ICD-10-CM

## 2014-02-03 DIAGNOSIS — E1142 Type 2 diabetes mellitus with diabetic polyneuropathy: Secondary | ICD-10-CM

## 2014-02-03 MED ORDER — NITROGLYCERIN 0.4 MG SL SUBL: 0.4000 mg | SUBLINGUAL_TABLET | SUBLINGUAL | Status: DC | PRN

## 2014-02-03 MED ORDER — METOPROLOL TARTRATE 25 MG PO TABS: 25.0000 mg | ORAL_TABLET | Freq: Two times a day (BID) | ORAL | Status: DC

## 2014-02-03 NOTE — Assessment & Plan Note (Signed)
Pt will return in next few days for fasting blood work. If metoprolol is tolerated, would consider addition of losartan 25mg  daily. Check renal panel along with SPEP.  If kidney fxn persistently impaired, will order renal US.  Pt agrees.

## 2014-02-03 NOTE — Assessment & Plan Note (Signed)
Overall atypical chest pain but some concerning features of story along with risk factors. EKG today - NSR rate 70s, LAD, normal intervals, no acute ST/T changes. unchanged from EKG 07/2013. Will refer to cardiology for further risk stratification, will provide with SL nitro for use at home, and will recommend he start taking metoprolol 25mg  bid regularly. On plavix for h/o stroke.   Discussed reasons to seek ER care including recurrent chest pain esp if exertional or any dyspnea. Pt agrees with plan.

## 2014-02-03 NOTE — Patient Instructions (Addendum)
Return at your convenience fasting for blood work one morning this week. Sign release for latest note from [name]. EKG overall looking ok today. I'd still like to refer you to cardiology however for further evaluation of your recent chest pain.  Pass by Marion's office to set this up. Start metoprolol 25mg  twice daily for heart rate and blood pressure control. Sublingual nitroglycerin sent in for you while we establish you with local cardiologist. Good to see you today, call us with questions. Return in 4 months for follow up, sooner if needed.

## 2014-02-03 NOTE — Progress Notes (Signed)
BP 136/70  Pulse 88  Temp(Src) 98.1 F (36.7 C) (Oral)  Wt 185 lb (83.915 kg)   CC: 3 mo f/u  Subjective:    Patient ID: James Bell, male    DOB: 28-Oct-1930, 80 y.o.   MRN: [id number]  HPI: James Bell is a 80 y.o. male presenting on 02/03/2014 for Follow-up   Recently had fasting blood work by Dr. [name] endo (about 3 weeks ago).  We have requested records of last note. Pt not fasting today but can return in next few days.  Mild chest discomfort on left side that runs up to neck. Described as pressure. Started this week, last night was worse pain and he did have some trouble sleeping.  Has been working left arm more.  Denies significant pain with exertion. Last episode last night, woke up this morning and felt better. Denies nausea or dyspnea. Decreased appetite endorsed.  CKD? - from last visit - elevated readings over last several months.  Plan was to recheck today along with SPEP, consider renal US.  Anticipate diabetic nephropathy related, and pt also has h/o injury to L kidney - laceration after accident.  HLD - taking lipitor 40mg  daily without myalgias.  Going to gym 1x/wk with trainer (decreased from prior 2/2 cost).  Doing light exercises daily at home.  Depression - on cymbalta 60mg  and trazodone 25mg  nightly prn (helps with sleep) - doesn't need to use regularly however.  DM - endorses good control of sugars.  Followed by [name].  Pt states last A1c <6%.  No h/o HTN. BP Readings from Last 3 Encounters:  02/03/14 136/70  01/27/14 137/67  01/08/14 162/78   Wt Readings from Last 3 Encounters:  02/03/14 185 lb (83.915 kg)  01/27/14 186 lb (84.369 kg)  01/08/14 184 lb (83.462 kg)  Body mass index is 28.14 kg/(m^2).   Past Medical History  Diagnosis Date  . HLD (hyperlipidemia)   . Restless leg syndrome     well controlled on mirapex  . OSA on CPAP     severe, CPAP at 10, previously on nuvigil (Dr. [name])  . Type 2 diabetes, controlled,  with neuropathy 2011  . H/O hiatal hernia   . GERD (gastroesophageal reflux disease)     with sliding HH  . Migraines 03/11/12    now rare  . CVA (cerebral infarction) 03/07/12    slurred speech; right hemplegia, left handed  . Basal cell carcinoma of nose 2002    bridge of nose  . History of kidney problems 12/2010    lacerated kidney after fall  . BPH with obstruction/lower urinary tract symptoms     failed flomax, uroxatral, myrbetriq - sees urology [name]) pending videourodynamics  . Depression     mild, on cymbalta per prior PCP  . Palpitations 01/2012    holter WNL, rare PAC/PVCs  . Anemia   . Erectile dysfunction     per urology (prostaglandin injection)    Relevant past medical, surgical, family and social history reviewed and updated as indicated.  Allergies and medications reviewed and updated. Current Outpatient Prescriptions on File Prior to Visit  Medication Sig  . atorvastatin (LIPITOR) 40 MG tablet Take 40 mg by mouth daily.  . cetirizine (ZYRTEC) 10 MG tablet Take 10 mg by mouth as needed for allergies.  Marland Kitchen clopidogrel (PLAVIX) 75 MG tablet TAKE 1 TABLET DAILY  . Coenzyme Q10 (COQ10) 150 MG CAPS Take 1 capsule by mouth 2 (two) times daily.  Marland Kitchen  diazepam (VALIUM) 5 MG tablet Take 5 mg by mouth every 8 (eight) hours as needed for anxiety.  . DULoxetine (CYMBALTA) 60 MG capsule Take 60 mg by mouth at bedtime.  . fish oil-omega-3 fatty acids 1000 MG capsule Take 2 g by mouth 2 (two) times daily.  . Folic Acid-Vit H0-QMV H84 (FOLBEE) 2.5-25-1 MG TABS tablet Take 1 tablet by mouth daily.  Marland Kitchen LANTUS SOLOSTAR 100 UNIT/ML Solostar Pen INJECT 50 UNITS UNDER THE SKIN AT BEDTIME  . omeprazole (PRILOSEC) 40 MG capsule Take 40 mg by mouth 2 (two) times daily as needed.  . pramipexole (MIRAPEX) 1 MG tablet TAKE 1 TABLET AT BEDTIME  . SitaGLIPtin-MetFORMIN HCl 50-1000 MG TB24 Take 1 tablet by mouth 2 (two) times daily before a meal.  . traZODone (DESYREL) 25 mg TABS tablet Take 0.5  tablets (25 mg total) by mouth at bedtime.  . vitamin C (ASCORBIC ACID) 500 MG tablet Take 1,000 mg by mouth daily.    No current facility-administered medications on file prior to visit.    Review of Systems Per HPI unless specifically indicated above    Objective:    BP 136/70  Pulse 88  Temp(Src) 98.1 F (36.7 C) (Oral)  Wt 185 lb (83.915 kg)  Physical Exam  Nursing note and vitals reviewed. Constitutional: He appears well-developed and well-nourished. No distress.  HENT:  Mouth/Throat: Oropharynx is clear and moist. No oropharyngeal exudate.  Chronically decreased elevation of R soft palate  Cardiovascular: Normal rate, regular rhythm, normal heart sounds and intact distal pulses.   No murmur heard. Pulmonary/Chest: Effort normal and breath sounds normal. No respiratory distress. He has no wheezes. He has no rales.  Musculoskeletal: He exhibits no edema.  Neurological:  Baseline R hemiparesis  Skin: Skin is warm and dry. No rash noted.  Psychiatric: He has a normal mood and affect.   Results for orders placed in visit on 10/27/13  LIPID PANEL      Result Value Ref Range   Cholesterol 160  0 - 200 mg/dL   Triglycerides 218.0 (*) 0.0 - 149.0 mg/dL   HDL 19.20 (*) >39.00 mg/dL   VLDL 43.6 (*) 0.0 - 40.0 mg/dL   Total CHOL/HDL Ratio 8    CBC WITH DIFFERENTIAL      Result Value Ref Range   WBC 10.9 (*) 4.5 - 10.5 K/uL   RBC 4.19 (*) 4.22 - 5.81 Mil/uL   Hemoglobin 12.6 (*) 13.0 - 17.0 g/dL   HCT 37.7 (*) 39.0 - 52.0 %   MCV 90.0  78.0 - 100.0 fl   MCHC 33.4  30.0 - 36.0 g/dL   RDW 13.6  11.5 - 14.6 %   Platelets 308.0  150.0 - 400.0 K/uL   Neutrophils Relative % 67.9  43.0 - 77.0 %   Lymphocytes Relative 23.9  12.0 - 46.0 %   Monocytes Relative 6.5  3.0 - 12.0 %   Eosinophils Relative 1.5  0.0 - 5.0 %   Basophils Relative 0.2  0.0 - 3.0 %   Neutro Abs 7.4  1.4 - 7.7 K/uL   Lymphs Abs 2.6  0.7 - 4.0 K/uL   Monocytes Absolute 0.7  0.1 - 1.0 K/uL   Eosinophils  Absolute 0.2  0.0 - 0.7 K/uL   Basophils Absolute 0.0  0.0 - 0.1 K/uL  HEMOGLOBIN A1C      Result Value Ref Range   Hemoglobin A1C 6.7 (*) 4.6 - 6.5 %  FERRITIN      Result  Value Ref Range   Ferritin 52.4  22.0 - 322.0 ng/mL  VITAMIN B12      Result Value Ref Range   Vitamin B-12 1324 (*) 211 - 911 pg/mL  RENAL FUNCTION PANEL      Result Value Ref Range   Sodium 138  135 - 145 mEq/L   Potassium 4.4  3.5 - 5.1 mEq/L   Chloride 103  96 - 112 mEq/L   CO2 26  19 - 32 mEq/L   Calcium 9.0  8.4 - 10.5 mg/dL   Albumin 3.5  3.5 - 5.2 g/dL   BUN 23  6 - 23 mg/dL   Creatinine, Ser 1.4  0.4 - 1.5 mg/dL   Glucose, Bld 116 (*) 70 - 99 mg/dL   Phosphorus 3.2  2.3 - 4.6 mg/dL   GFR 50.94 (*) >60.00 mL/min  LDL CHOLESTEROL, DIRECT      Result Value Ref Range   Direct LDL 107.2        Assessment & Plan:   Problem List Items Addressed This Visit   Type 2 diabetes, controlled, with neuropathy     Well controlled and followed by Dr. [name] - I have requested latest note to update diabetic meds - pt states he's off janumet and now on a different oral antihyperglycemic.    CKD (chronic kidney disease) stage 3, GFR 30-59 ml/min     Pt will return in next few days for fasting blood work. If metoprolol is tolerated, would consider addition of losartan 25mg  daily. Check renal panel along with SPEP.  If kidney fxn persistently impaired, will order renal US.  Pt agrees.    HLD (hyperlipidemia)     Not at goal of LDL <70 per recent check (LDL was 107). Pt will return in next 1-2 days fasting for FLP and will reassess control then. Prior was not taking lipitor regularly.    Relevant Medications      metoprolol tartrate (LOPRESSOR) tablet      nitroGLYCERIN (NITROSTAT) SL tablet   Depression     Continue cymbalta 60mg  daily.  Doing well with prn trazodone 25mg  nightly.    Chest pressure - Primary     Overall atypical chest pain but some concerning features of story along with risk factors. EKG  today - NSR rate 70s, LAD, normal intervals, no acute ST/T changes. unchanged from EKG 07/2013. Will refer to cardiology for further risk stratification, will provide with SL nitro for use at home, and will recommend he start taking metoprolol 25mg  bid regularly. On plavix for h/o stroke.   Discussed reasons to seek ER care including recurrent chest pain esp if exertional or any dyspnea. Pt agrees with plan.    Relevant Orders      EKG 12-Lead (Completed)      Ambulatory referral to Cardiology       Follow up plan: Return in about 4 months (around 06/06/2014), or if symptoms worsen or fail to improve, for follow up visit.

## 2014-02-03 NOTE — Assessment & Plan Note (Signed)
Not at goal of LDL <70 per recent check (LDL was 107). Pt will return in next 1-2 days fasting for FLP and will reassess control then. Prior was not taking lipitor regularly.

## 2014-02-03 NOTE — Assessment & Plan Note (Signed)
Continue cymbalta 60mg  daily.  Doing well with prn trazodone 25mg  nightly.

## 2014-02-03 NOTE — Assessment & Plan Note (Signed)
Well controlled and followed by Dr. [name] - I have requested latest note to update diabetic meds - pt states he's off janumet and now on a different oral antihyperglycemic.

## 2014-02-03 NOTE — Progress Notes (Signed)
Pre visit review using our clinic review tool, if applicable. No additional management support is needed unless otherwise documented below in the visit note. 

## 2014-02-04 ENCOUNTER — Telehealth: Payer: Self-pay | Admitting: Neurology

## 2014-02-04 DIAGNOSIS — G4733 Obstructive sleep apnea (adult) (pediatric): Secondary | ICD-10-CM

## 2014-02-04 DIAGNOSIS — I639 Cerebral infarction, unspecified: Secondary | ICD-10-CM

## 2014-02-04 DIAGNOSIS — R9431 Abnormal electrocardiogram [ECG] [EKG]: Secondary | ICD-10-CM

## 2014-02-04 NOTE — Telephone Encounter (Signed)
Please call and notify patient that the recent sleep study confirmed the diagnosis of OSA. He did well with CPAP during the study with significant improvement of the respiratory events. I will prescribe a new CPAP machine. He may have a DME company from before. Please verify with patient. I placed the order in the chart.   Arrange for CPAP set up at home through a DME company of patient's choice and fax/route report to PCP and referring MD (if other than PCP).   The patient will also need a follow up appointment with me in 6-8 weeks post set up that has to be scheduled; help the patient schedule this (in a follow-up slot).   Please re-enforce the importance of compliance with treatment and the need for [name] to monitor compliance data.   Once you have spoken to the patient and scheduled the return appointment, you may close this encounter, thanks,   Star Age, MD, PhD [facility] ([place])

## 2014-02-05 ENCOUNTER — Ambulatory Visit: Payer: Medicare Other | Admitting: Cardiovascular Disease

## 2014-02-05 ENCOUNTER — Encounter: Payer: Self-pay | Admitting: *Deleted

## 2014-02-05 ENCOUNTER — Other Ambulatory Visit: Payer: Self-pay | Admitting: Family Medicine

## 2014-02-05 NOTE — Telephone Encounter (Signed)
I called and spoke with the patient about his recent sleep study results. I informed the patient that the study confirm the diagnosis of obstructive sleep apnea and that he did well on CPAP during the night of his study with significant improvement of his respiratory events. Dr. Rexene Alberts recommend CPAP therapy at home, so I will send the CPAP order to [facility] and they will contact the patient once receiving benefits for CPAP. I will fax a copy of the report to Dr. Bosie Clos office and mail a copy to the patient along with a follow up instruction letter.

## 2014-02-06 ENCOUNTER — Encounter: Payer: Self-pay | Admitting: Family Medicine

## 2014-02-06 ENCOUNTER — Other Ambulatory Visit (INDEPENDENT_AMBULATORY_CARE_PROVIDER_SITE_OTHER): Payer: Medicare Other

## 2014-02-06 DIAGNOSIS — N183 Chronic kidney disease, stage 3 unspecified: Secondary | ICD-10-CM

## 2014-02-06 DIAGNOSIS — E785 Hyperlipidemia, unspecified: Secondary | ICD-10-CM

## 2014-02-06 LAB — RENAL FUNCTION PANEL
Albumin: 3.6 g/dL (ref 3.5–5.2)
BUN: 21 mg/dL (ref 6–23)
CO2: 27 mEq/L (ref 19–32)
Calcium: 8.9 mg/dL (ref 8.4–10.5)
Chloride: 104 mEq/L (ref 96–112)
Creatinine, Ser: 1.4 mg/dL (ref 0.4–1.5)
GFR: 52.61 mL/min — ABNORMAL LOW (ref >60.00)
Glucose, Bld: 88 mg/dL (ref 70–99)
Phosphorus: 3.5 mg/dL (ref 2.3–4.6)
Potassium: 4.4 mEq/L (ref 3.5–5.1)
Sodium: 138 mEq/L (ref 135–145)

## 2014-02-06 LAB — LIPID PANEL
Cholesterol: 102 mg/dL (ref 0–200)
HDL: 19.8 mg/dL — ABNORMAL LOW (ref >39.00)
LDL Cholesterol: 36 mg/dL (ref 0–99)
Total CHOL/HDL Ratio: 5
Triglycerides: 233 mg/dL — ABNORMAL HIGH (ref 0.0–149.0)
VLDL: 46.6 mg/dL — ABNORMAL HIGH (ref 0.0–40.0)

## 2014-02-11 DIAGNOSIS — R946 Abnormal results of thyroid function studies: Secondary | ICD-10-CM | POA: Diagnosis not present

## 2014-02-11 LAB — PROTEIN ELECTROPHORESIS, SERUM
Albumin ELP: 55.8 % (ref 55.8–66.1)
Alpha-1-Globulin: 3.5 % (ref 2.9–4.9)
Alpha-2-Globulin: 12.5 % — ABNORMAL HIGH (ref 7.1–11.8)
Beta 2: 9.2 % — ABNORMAL HIGH (ref 3.2–6.5)
Beta Globulin: 7.2 % (ref 4.7–7.2)
Gamma Globulin: 11.8 % (ref 11.1–18.8)
Total Protein, Serum Electrophoresis: 6.9 g/dL (ref 6.0–8.3)

## 2014-02-12 ENCOUNTER — Other Ambulatory Visit: Payer: Self-pay | Admitting: Family Medicine

## 2014-02-13 ENCOUNTER — Other Ambulatory Visit: Payer: Self-pay | Admitting: *Deleted

## 2014-02-13 MED ORDER — TRAZODONE 25 MG HALF TABLET: 25.0000 mg | ORAL_TABLET | Freq: Every day | ORAL | Status: DC

## 2014-02-13 MED ORDER — OMEPRAZOLE 40 MG PO CPDR: 40.0000 mg | DELAYED_RELEASE_CAPSULE | Freq: Two times a day (BID) | ORAL | Status: DC | PRN

## 2014-02-13 MED ORDER — NITROGLYCERIN 0.4 MG SL SUBL: 0.4000 mg | SUBLINGUAL_TABLET | SUBLINGUAL | Status: DC | PRN

## 2014-02-13 NOTE — Telephone Encounter (Signed)
Ok to refill? Wants 90 day supply sent to mail order.

## 2014-02-14 ENCOUNTER — Other Ambulatory Visit: Payer: Self-pay | Admitting: Family Medicine

## 2014-02-14 DIAGNOSIS — N183 Chronic kidney disease, stage 3 unspecified: Secondary | ICD-10-CM

## 2014-02-16 DIAGNOSIS — N529 Male erectile dysfunction, unspecified: Secondary | ICD-10-CM | POA: Diagnosis not present

## 2014-02-16 DIAGNOSIS — N281 Cyst of kidney, acquired: Secondary | ICD-10-CM

## 2014-02-16 DIAGNOSIS — N401 Enlarged prostate with lower urinary tract symptoms: Secondary | ICD-10-CM | POA: Diagnosis not present

## 2014-02-16 DIAGNOSIS — N139 Obstructive and reflux uropathy, unspecified: Secondary | ICD-10-CM | POA: Diagnosis not present

## 2014-02-16 HISTORY — DX: Cyst of kidney, acquired: N28.1

## 2014-02-16 HISTORY — PX: CARDIOVASCULAR STRESS TEST: SHX262

## 2014-02-17 ENCOUNTER — Encounter: Payer: Self-pay | Admitting: Neurology

## 2014-02-17 ENCOUNTER — Encounter: Payer: Self-pay | Admitting: Family Medicine

## 2014-02-20 ENCOUNTER — Telehealth: Payer: Self-pay

## 2014-02-20 ENCOUNTER — Encounter: Payer: Self-pay | Admitting: Family Medicine

## 2014-02-20 ENCOUNTER — Ambulatory Visit: Payer: Self-pay | Admitting: Family Medicine

## 2014-02-20 DIAGNOSIS — N189 Chronic kidney disease, unspecified: Secondary | ICD-10-CM | POA: Diagnosis not present

## 2014-02-20 DIAGNOSIS — N183 Chronic kidney disease, stage 3 unspecified: Secondary | ICD-10-CM | POA: Diagnosis not present

## 2014-02-20 NOTE — Telephone Encounter (Signed)
plz call to clarify - I want trazodone 50mg  take 1/2 tablet nightly

## 2014-02-20 NOTE — Telephone Encounter (Signed)
[facility] Bell with [facility] said received rx for trazodone 25 mg. Trazodone's lowest dosage is 50 mg.Please advise. James Bell request cb using ref # [id number].Please advise.

## 2014-02-20 NOTE — Telephone Encounter (Signed)
Rx called in as directed.   

## 2014-02-26 ENCOUNTER — Other Ambulatory Visit: Payer: Self-pay | Admitting: Family Medicine

## 2014-02-26 MED ORDER — OMEGA-3-ACID ETHYL ESTERS 1 G PO CAPS
2.0000 g | ORAL_CAPSULE | Freq: Two times a day (BID) | ORAL | Status: DC
Start: 2014-02-26 — End: 2014-02-27

## 2014-02-27 ENCOUNTER — Ambulatory Visit (INDEPENDENT_AMBULATORY_CARE_PROVIDER_SITE_OTHER): Payer: Medicare Other | Admitting: Cardiovascular Disease

## 2014-02-27 ENCOUNTER — Other Ambulatory Visit: Payer: Self-pay | Admitting: *Deleted

## 2014-02-27 ENCOUNTER — Encounter: Payer: Self-pay | Admitting: Cardiovascular Disease

## 2014-02-27 VITALS — BP 140/72 | HR 89 | Ht 68.0 in | Wt 179.8 lb

## 2014-02-27 DIAGNOSIS — E785 Hyperlipidemia, unspecified: Secondary | ICD-10-CM | POA: Diagnosis not present

## 2014-02-27 DIAGNOSIS — I6350 Cerebral infarction due to unspecified occlusion or stenosis of unspecified cerebral artery: Secondary | ICD-10-CM

## 2014-02-27 DIAGNOSIS — R000 Tachycardia, unspecified: Secondary | ICD-10-CM | POA: Diagnosis not present

## 2014-02-27 DIAGNOSIS — R079 Chest pain, unspecified: Secondary | ICD-10-CM | POA: Diagnosis not present

## 2014-02-27 DIAGNOSIS — R002 Palpitations: Secondary | ICD-10-CM

## 2014-02-27 DIAGNOSIS — R0789 Other chest pain: Secondary | ICD-10-CM

## 2014-02-27 MED ORDER — OMEGA-3-ACID ETHYL ESTERS 1 G PO CAPS: 2.0000 g | ORAL_CAPSULE | Freq: Two times a day (BID) | ORAL | Status: DC

## 2014-02-27 NOTE — Progress Notes (Signed)
Primary care physician: Dr. 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 He has no previous cardiac history. He has multiple chronic medical conditions that include diabetes, hypertension and hyperlipidemia. He suffered from a stroke 2 years ago which forced him to retire. A stroke has caused significant residual right-sided weakness. He reports recent episodes of left-sided chest pain described as aching both at rest and with physical activities. He walks with a cane and he relies on his left arm for mobility. He thinks that added extra stress to the muscles in the left upper chest area. His functional capacity is somewhat limited due to his prior stroke. No orthopnea or PND. He reports palpitations and elevated heart rate especially in the morning. He reports having cardiac catheterization done in 2004 in [place] with no significant obstructive disease. He is not a smoker.  Allergies  Allergen Reactions  . Lisinopril Cough    tachycardia  . Nexium [Esomeprazole] Other (See Comments)    Headache  . Niacin And Related Other (See Comments)    flushing     Current Outpatient Prescriptions on File Prior to Visit  Medication Sig Dispense Refill  . atorvastatin (LIPITOR) 40 MG tablet Take 40 mg by mouth daily.      . Canagliflozin-Metformin HCl (INVOKAMET) 50-1000 MG TABS Take 1 tablet by mouth 2 (two) times daily.  60 tablet    . cetirizine (ZYRTEC) 10 MG tablet Take 10 mg by mouth as needed for allergies.      Marland Kitchen clopidogrel (PLAVIX) 75 MG tablet TAKE 1 TABLET DAILY  90 tablet  2  . Coenzyme Q10 (COQ10) 150 MG CAPS Take 1 capsule by mouth 2 (two) times daily.      . diazepam (VALIUM) 5 MG tablet Take 5 mg by mouth every 8 (eight) hours as needed for anxiety.      . DULoxetine (CYMBALTA) 60 MG capsule Take 60 mg by mouth at bedtime.      . Folic Acid-Vit W1-XBJ Y78 (FOLBEE) 2.5-25-1 MG TABS tablet Take 1 tablet  by mouth daily.      Marland Kitchen LANTUS SOLOSTAR 100 UNIT/ML Solostar Pen INJECT 50 UNITS UNDER THE SKIN AT BEDTIME  3 pen  3  . losartan (COZAAR) 25 MG tablet Take 1 tablet (25 mg total) by mouth daily.      . metoprolol tartrate (LOPRESSOR) 25 MG tablet Take 1 tablet (25 mg total) by mouth 2 (two) times daily.  180 tablet  3  . nitroGLYCERIN (NITROSTAT) 0.4 MG SL tablet Place 1 tablet (0.4 mg total) under the tongue every 5 (five) minutes as needed for chest pain.  90 tablet  0  . NOVOFINE 30G X 8 MM MISC USE AS DIRECTED ONE DAILY  100 each  3  . omega-3 acid ethyl esters (LOVAZA) 1 G capsule Take 2 capsules (2 g total) by mouth 2 (two) times daily.  120 capsule  6  . omeprazole (PRILOSEC) 40 MG capsule Take 1 capsule (40 mg total) by mouth 2 (two) times daily as needed.  180 capsule  1  . pramipexole (MIRAPEX) 1 MG tablet TAKE 1 TABLET AT BEDTIME  90 tablet  2  . traZODone (DESYREL) 25 mg TABS tablet Take 0.5 tablets (25 mg total) by mouth at bedtime.  45 tablet  3  . vitamin C (ASCORBIC ACID) 500 MG tablet Take 1,000 mg by mouth daily.        No  current facility-administered medications on file prior to visit.     Past Medical History  Diagnosis Date  . HLD (hyperlipidemia)   . Restless leg syndrome     well controlled on mirapex  . OSA on CPAP     sleep study 01/2014 - severe, CPAP at 10, previously on nuvigil (James Bell)  . Type 2 diabetes, controlled, with neuropathy 2011  . H/O hiatal hernia   . GERD (gastroesophageal reflux disease)     with sliding HH  . Migraines 03/11/12    now rare  . CVA (cerebral infarction) 03/07/12    slurred speech; right hemplegia, left handed  . Basal cell carcinoma of nose 2002    bridge of nose  . History of kidney problems 12/2010    lacerated kidney after fall  . BPH with obstruction/lower urinary tract symptoms     failed flomax, uroxatral, myrbetriq - sees urology James Bell) pending videourodynamics  . Depression     mild, on cymbalta per  prior PCP  . Palpitations 01/2012    holter WNL, rare PAC/PVCs  . Anemia   . Erectile dysfunction     per urology (prostaglandin injection)  . Kidney cysts 02/2014    bilateral (renal US)  . CKD (chronic kidney disease) stage 3, GFR 30-59 ml/min 03/11/2012    Renal US 02/2014 - multiple kidney cysts   . Stroke   . TIA (transient ischemic attack)      Past Surgical History  Procedure Laterality Date  . Prostate surgery  02/2009    PVP (laser)  . Tonsillectomy and adenoidectomy  ~ 1945  . Hemorrhoid surgery  1991  . Cataract extraction w/ intraocular lens implant  11//2012 - 08/2011  . Inguinal hernia repair  1981; 2002    left, then repaired  . Skin cancer excision  2002    bridge of nose, BCC  . Colonoscopy  05/2011    normal per pt (Dr. Corky Bell, [place], [name])  . Mri brain  04/2010    WNL  . Esophagogastroduodenoscopy  2006    duodenitis, medual HH, Grade 2 reflux, mild gastritis  . Colonoscopy  03/2011    poor prep, unable to biopsy polyp in tic fold, rpt 3 mo James Bell)  . Abi  2007    WNL  . Urethral dilation  2014    tannenbaum  . Cystoscopy with urethral dilatation N/A 07/24/2013    Procedure: CYSTOSCOPY WITH URETHRAL DILATATION;  Surgeon: James Rud, MD;  Location: [facility];  Service: Urology;  Laterality: N/A;  . Transurethral incision of prostate N/A 07/24/2013    Procedure: TRANSURETHRAL INCISION OF THE PROSTATE (TUIP);  Surgeon: James Rud, MD;  Location: [facility];  Service: Urology;  Laterality: N/A;  . Esophagogastroduodenoscopy  07/2011    mild chronic gastritis  . Colonoscopy  06/2011    mult polyps adenomatous, severe diverticulosis, rpt 3 yrs James Bell)  . Carotid endarterectomy    . Cardiac catheterization  2004    normal; [place]     Family History  Problem Relation Age of Onset  . Stroke Father   . Lung cancer Father 73     smoker  . Stroke Brother   . Diabetes Brother 57  . Coronary artery disease Neg Hx   . Lung cancer Brother     . Alcoholism Sister   . Kidney disease Sister      History   Social History  . Marital Status: Unknown    Spouse  Name: N/A    Number of Children: 2  . Years of Education: MD   Occupational History  . retired   .     Social History Main Topics  . Smoking status: Never Smoker   . Smokeless tobacco: Never Used     Comment: "used to smoke pipe when I was younger"  . Alcohol Use: 2.4 oz/week    2 Glasses of wine, 2 Shots of liquor per week     Comment: occasional  . Drug Use: No  . Sexual Activity: Yes   Other Topics Concern  . Not on file   Social History Narrative   Caffeine: 1 cup coffee/day   Divorced   Occupation: ophthalmologist   Edu: MD   Activity: rehab s/p CVA   Diet: some water, fruits/vegetables daily   POA - [name] (Daughter)     ROS A 10 point review of system was performed. It is negative other than that mentioned in the history of present illness.   PHYSICAL EXAM   BP 140/72  Pulse 89  Ht 5\' 8"  (1.727 m)  Wt 179 lb 12 oz (81.534 kg)  BMI 27.34 kg/m2 Constitutional: He is oriented to person, place, and time. He appears well-developed and well-nourished. No distress.  HENT: No nasal discharge.  Head: Normocephalic and atraumatic.  Eyes: Pupils are equal and round.  No discharge. Neck: Normal range of motion. Neck supple. No JVD present. No thyromegaly present.  Cardiovascular: Normal rate, regular rhythm, normal heart sounds. Exam reveals no gallop and no friction rub. No murmur heard.  Pulmonary/Chest: Effort normal and breath sounds normal. No stridor. No respiratory distress. He has no wheezes. He has no rales. He exhibits no tenderness.  Abdominal: Soft. Bowel sounds are normal. He exhibits no distension. There is no tenderness. There is no rebound and no guarding.  Musculoskeletal: Normal range of motion. He exhibits no edema and no tenderness.  Neurological: He is alert and oriented to person, place, and time. Coordination normal.   Skin: Skin is warm and dry. No rash noted. He is not diaphoretic. No erythema. No pallor.  Psychiatric: He has a normal mood and affect. His behavior is normal. Judgment and thought content normal.       MMC:RFVOH  Rhythm  WITHIN NORMAL LIMITS   ASSESSMENT AND PLAN

## 2014-02-27 NOTE — Assessment & Plan Note (Signed)
The chest pain is overall atypical and certainly could be musculoskeletal. However, he has multiple risk factors for coronary artery disease and thus I recommend evaluation with a pharmacologic nuclear stress test. He is not able to exercise on a treadmill due to his previous stroke. Otherwise, continue treatment of risk factors. If stress test is normal, he can followup with me as needed.

## 2014-02-27 NOTE — Assessment & Plan Note (Signed)
Symptoms are reasonably controlled with metoprolol.

## 2014-02-27 NOTE — Patient Instructions (Addendum)
[place]  Your caregiver has ordered a Stress Test with nuclear imaging. The purpose of this test is to evaluate the blood supply to your heart muscle. This procedure is referred to as a "Non-Invasive Stress Test." This is because other than having an IV started in your vein, nothing is inserted or "invades" your body. Cardiac stress tests are done to find areas of poor blood flow to the heart by determining the extent of coronary artery disease (CAD). Some patients exercise on a treadmill, which naturally increases the blood flow to your heart, while others who are  unable to walk on a treadmill due to physical limitations have a pharmacologic/chemical stress agent called Lexiscan . This medicine will mimic walking on a treadmill by temporarily increasing your coronary blood flow.   Please note: these test may take anywhere between 2-4 hours to complete  PLEASE REPORT TO [facility] AT THE FIRST DESK WILL DIRECT YOU WHERE TO GO  Date of Procedure:________6/17/15_____________________________  Arrival Time for Procedure:_________0745 am_____________________  Instructions regarding medication:   _x___ : Hold diabetes medication morning of procedure   PLEASE NOTIFY THE OFFICE AT LEAST 24 HOURS IN ADVANCE IF YOU ARE UNABLE TO KEEP YOUR APPOINTMENT.  [phone] AND  PLEASE NOTIFY NUCLEAR MEDICINE AT [facility] AT LEAST 24 HOURS IN ADVANCE IF YOU ARE UNABLE TO KEEP YOUR APPOINTMENT. [phone]  How to prepare for your Myoview test:  1. Do not eat or drink after midnight 2. No caffeine for 24 hours prior to test 3. No smoking 24 hours prior to test. 4. Your medication may be taken with water.  If your doctor stopped a medication because of this test, do not take that medication. 5. Ladies, please do not wear dresses.  Skirts or pants are appropriate. Please wear a short sleeve shirt. 6. No perfume, cologne or lotion. 7. Wear comfortable walking shoes. No  heels!   Your physician recommends that you schedule a follow-up appointment in:  As needed

## 2014-02-27 NOTE — Assessment & Plan Note (Signed)
Agree with treatment with a statin with a target LDL of less than 100.

## 2014-03-02 ENCOUNTER — Telehealth: Payer: Self-pay

## 2014-03-02 ENCOUNTER — Other Ambulatory Visit: Payer: Self-pay | Admitting: Family Medicine

## 2014-03-02 NOTE — Telephone Encounter (Signed)
Patient notified and appt scheduled.

## 2014-03-02 NOTE — Telephone Encounter (Signed)
I see pt saw Dr. Fletcher Anon. Ok to cancel appt with me - reschedule at his convenience.

## 2014-03-02 NOTE — Telephone Encounter (Signed)
Pt thinks he has appt scheduled with Dr Danise Mina on 03-03-14( I do not see appt scheduled with Dr Darnell Level on 03/03/14); pt said he has a stress scheduled later this week and wants to know if he should cancel the appt on 03/03/14 and reschedule for 2 weeks after stress test. Pt said he feels OK; pt request cb.

## 2014-03-03 ENCOUNTER — Ambulatory Visit: Payer: Medicare Other | Admitting: Family Medicine

## 2014-03-03 ENCOUNTER — Telehealth: Payer: Self-pay | Admitting: *Deleted

## 2014-03-03 NOTE — Telephone Encounter (Signed)
PA for Lovaza in your IN box for completion.

## 2014-03-04 ENCOUNTER — Ambulatory Visit: Payer: Self-pay | Admitting: Cardiovascular Disease

## 2014-03-04 ENCOUNTER — Other Ambulatory Visit: Payer: Self-pay | Admitting: *Deleted

## 2014-03-04 ENCOUNTER — Other Ambulatory Visit: Payer: Self-pay

## 2014-03-04 DIAGNOSIS — R079 Chest pain, unspecified: Secondary | ICD-10-CM | POA: Diagnosis not present

## 2014-03-05 NOTE — Telephone Encounter (Signed)
PA faxed. Will await determination. 

## 2014-03-05 NOTE — Telephone Encounter (Signed)
Filled and placed in Kim's box. 

## 2014-03-09 ENCOUNTER — Other Ambulatory Visit: Payer: Self-pay | Admitting: Family Medicine

## 2014-03-10 ENCOUNTER — Encounter: Payer: Medicare Other | Attending: Physical Medicine & Rehabilitation

## 2014-03-10 ENCOUNTER — Ambulatory Visit (HOSPITAL_BASED_OUTPATIENT_CLINIC_OR_DEPARTMENT_OTHER): Payer: Medicare Other | Admitting: Physical Medicine & Rehabilitation

## 2014-03-10 ENCOUNTER — Encounter: Payer: Self-pay | Admitting: Family Medicine

## 2014-03-10 ENCOUNTER — Other Ambulatory Visit: Payer: Self-pay

## 2014-03-10 VITALS — BP 125/76 | HR 102 | Resp 14 | Ht 69.0 in | Wt 180.0 lb

## 2014-03-10 DIAGNOSIS — M79609 Pain in unspecified limb: Secondary | ICD-10-CM | POA: Insufficient documentation

## 2014-03-10 DIAGNOSIS — M25819 Other specified joint disorders, unspecified shoulder: Secondary | ICD-10-CM | POA: Diagnosis not present

## 2014-03-10 DIAGNOSIS — M7580 Other shoulder lesions, unspecified shoulder: Secondary | ICD-10-CM

## 2014-03-10 DIAGNOSIS — G8110 Spastic hemiplegia affecting unspecified side: Secondary | ICD-10-CM | POA: Diagnosis not present

## 2014-03-10 DIAGNOSIS — I69959 Hemiplegia and hemiparesis following unspecified cerebrovascular disease affecting unspecified side: Secondary | ICD-10-CM | POA: Diagnosis not present

## 2014-03-10 NOTE — Patient Instructions (Signed)
Post procedure instructions given

## 2014-03-10 NOTE — Progress Notes (Signed)
Phenol neurolysis of the Right musculo- cutaneous nerve  Indication: Severe spasticity in the arm flexor muscles which is not responding to medical management and other conservative care and interfering with functional use and hygiene.  Informed consent was obtained after describing the risks and benefits of the procedure with the patient this includes bleeding bruising and infection as well as medication side effects. The patient elected to proceed and has given written consent. Patient placed in a supine position on the exam table. External DC stimulation was applied to the axilla using a nerve stimulator. Arm flexion twitch was obtained. The axillary region was prepped with Betadine and then entered with a 22-gauge 40 mm needle electrode under electrical stimulation guidance. Arm flexion which was obtained and confirmed. Then 4 cc of 5% phenol were injected. The patient tolerated procedure well. Post procedure instructions and followup visit were given. 

## 2014-03-10 NOTE — Telephone Encounter (Signed)
Pt left letter from [facility] requesting refills for losartan and diazepam to [facility]. Also attached was request from [facility] for tens unit. This info is for Dr Delrae Rend. Unable to reach pt to get more info about rx request, tens unit request and to see if pt is still seeing Dr Delrae Rend. Information is in Dr Synthia Innocent in box. Pt has appt on 03/16/14 with Dr Darnell Level.Please advise.

## 2014-03-11 ENCOUNTER — Telehealth: Payer: Self-pay | Admitting: *Deleted

## 2014-03-11 NOTE — Telephone Encounter (Signed)
Opened in error

## 2014-03-12 MED ORDER — DIAZEPAM 5 MG PO TABS: 5.0000 mg | ORAL_TABLET | Freq: Three times a day (TID) | ORAL | Status: DC | PRN

## 2014-03-12 MED ORDER — LOSARTAN POTASSIUM 25 MG PO TABS: 25.0000 mg | ORAL_TABLET | Freq: Every day | ORAL | Status: DC

## 2014-03-12 NOTE — Telephone Encounter (Signed)
Printed diazepam to fax and placed in Kim's box Sent in losartan

## 2014-03-12 NOTE — Telephone Encounter (Signed)
There was no Rx for diazepam in my IN box this AM.

## 2014-03-12 NOTE — Telephone Encounter (Signed)
In Kim's box. TENS unit should go through Dr. Letta Pate.

## 2014-03-12 NOTE — Telephone Encounter (Signed)
Rx faxed and order for TENS faxed to Dr. Letta Pate.

## 2014-03-16 ENCOUNTER — Encounter: Payer: Self-pay | Admitting: Family Medicine

## 2014-03-16 ENCOUNTER — Ambulatory Visit (INDEPENDENT_AMBULATORY_CARE_PROVIDER_SITE_OTHER): Payer: Medicare Other | Admitting: Family Medicine

## 2014-03-16 VITALS — BP 128/68 | HR 80 | Temp 98.3°F | Wt 183.2 lb

## 2014-03-16 DIAGNOSIS — M25512 Pain in left shoulder: Secondary | ICD-10-CM | POA: Insufficient documentation

## 2014-03-16 DIAGNOSIS — N183 Chronic kidney disease, stage 3 unspecified: Secondary | ICD-10-CM | POA: Diagnosis not present

## 2014-03-16 DIAGNOSIS — I639 Cerebral infarction, unspecified: Secondary | ICD-10-CM

## 2014-03-16 DIAGNOSIS — M25519 Pain in unspecified shoulder: Secondary | ICD-10-CM | POA: Insufficient documentation

## 2014-03-16 DIAGNOSIS — E785 Hyperlipidemia, unspecified: Secondary | ICD-10-CM

## 2014-03-16 DIAGNOSIS — E1140 Type 2 diabetes mellitus with diabetic neuropathy, unspecified: Secondary | ICD-10-CM

## 2014-03-16 DIAGNOSIS — E1149 Type 2 diabetes mellitus with other diabetic neurological complication: Secondary | ICD-10-CM

## 2014-03-16 DIAGNOSIS — I6350 Cerebral infarction due to unspecified occlusion or stenosis of unspecified cerebral artery: Secondary | ICD-10-CM

## 2014-03-16 DIAGNOSIS — E1142 Type 2 diabetes mellitus with diabetic polyneuropathy: Secondary | ICD-10-CM

## 2014-03-16 NOTE — Progress Notes (Signed)
BP 128/68  Pulse 80  Temp(Src) 98.3 F (36.8 C) (Oral)  Wt 183 lb 4 oz (83.122 kg)   CC: f/u, discuss shoulder and meds  Subjective:    Patient ID: James Bell, male    DOB: 1930-10-24, 80 y.o.   MRN: [id number]  HPI: James Bell is a 80 y.o. male presenting on 03/16/2014 for Follow-up and Shoulder Pain   Recent eval by cards for L chest pain with radiation to shoulder.   Normal myoview with EF 68% ([place]).  Persistent left shoulder pain - over last 6 months. Relies on left shoulder 2/2 R sided weakness post stroke.  Does get better with rest and aleve, worse with use/exertion. Anterior L shoulder discomfort with rare radiation up to left neck. No numbness or weakness.   HLD - lovaza recently approved by insurance for persistently elevated trig despite statin.  Antihypertensives - wondered about losartan dosing. BP Readings from Last 3 Encounters:  03/16/14 128/68  03/10/14 125/76  02/27/14 140/72   Relevant past medical, surgical, family and social history reviewed and updated as indicated.  Allergies and medications reviewed and updated. Current Outpatient Prescriptions on File Prior to Visit  Medication Sig  . atorvastatin (LIPITOR) 40 MG tablet TAKE 1 TABLET DAILY  . Canagliflozin-Metformin HCl (INVOKAMET) 50-1000 MG TABS Take 1 tablet by mouth 2 (two) times daily.  . cetirizine (ZYRTEC) 10 MG tablet Take 10 mg by mouth as needed for allergies.  Marland Kitchen clopidogrel (PLAVIX) 75 MG tablet TAKE 1 TABLET DAILY  . Coenzyme Q10 (COQ10) 150 MG CAPS Take 1 capsule by mouth 2 (two) times daily.  . diazepam (VALIUM) 5 MG tablet Take 1 tablet (5 mg total) by mouth every 8 (eight) hours as needed for anxiety.  . DULoxetine (CYMBALTA) 60 MG capsule Take 60 mg by mouth at bedtime.  . Folic Acid-Vit M0-NOB S96 (FOLBEE) 2.5-25-1 MG TABS tablet Take 1 tablet by mouth daily.  Marland Kitchen LANTUS SOLOSTAR 100 UNIT/ML Solostar Pen INJECT 50 UNITS UNDER THE SKIN AT BEDTIME  . losartan (COZAAR)  25 MG tablet Take 1 tablet (25 mg total) by mouth daily.  . metoprolol tartrate (LOPRESSOR) 25 MG tablet Take 1 tablet (25 mg total) by mouth 2 (two) times daily.  . nitroGLYCERIN (NITROSTAT) 0.4 MG SL tablet Place 1 tablet (0.4 mg total) under the tongue every 5 (five) minutes as needed for chest pain.  Marland Kitchen NOVOFINE 30G X 8 MM MISC USE AS DIRECTED ONE DAILY  . omega-3 acid ethyl esters (LOVAZA) 1 G capsule Take 2 capsules (2 g total) by mouth 2 (two) times daily.  Marland Kitchen omeprazole (PRILOSEC) 40 MG capsule Take 1 capsule (40 mg total) by mouth 2 (two) times daily as needed.  . pramipexole (MIRAPEX) 1 MG tablet TAKE 1 TABLET AT BEDTIME  . traZODone (DESYREL) 25 mg TABS tablet Take 0.5 tablets (25 mg total) by mouth at bedtime.  . vitamin C (ASCORBIC ACID) 500 MG tablet Take 1,000 mg by mouth daily.    No current facility-administered medications on file prior to visit.    Review of Systems Per HPI unless specifically indicated above    Objective:    BP 128/68  Pulse 80  Temp(Src) 98.3 F (36.8 C) (Oral)  Wt 183 lb 4 oz (83.122 kg)  Physical Exam  Nursing note and vitals reviewed. Constitutional: He appears well-developed and well-nourished. No distress.  HENT:  Mouth/Throat: Oropharynx is clear and moist. No oropharyngeal exudate.  Cardiovascular: Normal rate, regular rhythm, normal  heart sounds and intact distal pulses.   No murmur heard. Pulmonary/Chest: Effort normal and breath sounds normal. No respiratory distress. He has no wheezes. He has no rales.  Musculoskeletal: He exhibits no edema.  R shoulder - decreased ROM and strength 2/2 residual hemiparesis L Shoulder exam: No deformity of shoulders on inspection. Tender to palpation of L subacromial bursa FROM in abduction and forward flexion. No pain or weakness with testing SITS in ext/int rotation. Pain with empty can sign. Neg Speed test. No impingement. No pain with crossover test. No pain with rotation of humeral head in  [place] joint.  Skin: Skin is warm and dry. No rash noted.      Assessment & Plan:   Problem List Items Addressed This Visit   Type 2 diabetes, controlled, with neuropathy      Lab Results  Component Value Date   HGBA1C 6.7* 10/27/2013  followed by endo.    Left shoulder pain - Primary     L handed. Anticipate combination of subacromial bursitis and rotator cuff injury with overuse injury due to R hemiparesis. Treat with heating pad, exercises from SM pt advisor on shoulder bursitis, and provided with script for shoulder orthotic pt requests to keep L arm in abduction.  Form filled out today. Update if not improving as expected or if any worsening sxs - would consider SM referral vs steroid injection into shoulder    HLD (hyperlipidemia)     lovaza approved. Continue lipitor 40mg  daily.    CVA (cerebrovascular accident)     Due to small vessel disease. Stable on plavix and aspirin.    CKD (chronic kidney disease) stage 3, GFR 30-59 ml/min     Reviewed renal US with patient. SPEP without M spike Will continue to monitor for now. Tolerating low dose losartan - continue this.        Follow up plan: Return as needed, for previously scheduled appointment.

## 2014-03-16 NOTE — Assessment & Plan Note (Signed)
L handed. Anticipate combination of subacromial bursitis and rotator cuff injury with overuse injury due to R hemiparesis. Treat with heating pad, exercises from [facility] pt advisor on shoulder bursitis, and provided with script for shoulder orthotic pt requests to keep L arm in abduction.  Form filled out today. Update if not improving as expected or if any worsening sxs - would consider SM referral vs steroid injection into shoulder

## 2014-03-16 NOTE — Assessment & Plan Note (Signed)
lovaza approved. Continue lipitor 40mg  daily.

## 2014-03-16 NOTE — Assessment & Plan Note (Signed)
Lab Results  Component Value Date   HGBA1C 6.7* 10/27/2013  followed by endo.

## 2014-03-16 NOTE — Assessment & Plan Note (Signed)
Due to small vessel disease. Stable on plavix and aspirin.

## 2014-03-16 NOTE — Patient Instructions (Signed)
Let's continue current losartan dose. For shoulder - I think you have combination of shoulder bursitis and rotator cuff irritation. Treat with exercises provided today as well as I've sent script for shoulder orthosis.  If not better let us know to discuss steroid shot or referral to sports medicine doctor. Good to see you today, call us with questions. Return to see me at previously scheduled appointment in September.

## 2014-03-16 NOTE — Progress Notes (Signed)
Pre visit review using our clinic review tool, if applicable. No additional management support is needed unless otherwise documented below in the visit note. 

## 2014-03-16 NOTE — Assessment & Plan Note (Addendum)
Reviewed renal US with patient. SPEP without M spike Will continue to monitor for now. Tolerating low dose losartan - continue this.

## 2014-03-23 ENCOUNTER — Ambulatory Visit: Payer: Medicare Other | Admitting: Cardiovascular Disease

## 2014-03-31 ENCOUNTER — Other Ambulatory Visit: Payer: Self-pay | Admitting: Family Medicine

## 2014-04-13 ENCOUNTER — Encounter: Payer: Medicare Other | Attending: Physical Medicine & Rehabilitation

## 2014-04-13 ENCOUNTER — Ambulatory Visit (HOSPITAL_BASED_OUTPATIENT_CLINIC_OR_DEPARTMENT_OTHER): Payer: Medicare Other | Admitting: Physical Medicine & Rehabilitation

## 2014-04-13 ENCOUNTER — Encounter: Payer: Self-pay | Admitting: Physical Medicine & Rehabilitation

## 2014-04-13 VITALS — BP 133/64 | HR 93 | Resp 16 | Ht 69.0 in | Wt 184.0 lb

## 2014-04-13 DIAGNOSIS — G8110 Spastic hemiplegia affecting unspecified side: Secondary | ICD-10-CM | POA: Diagnosis not present

## 2014-04-13 DIAGNOSIS — I69959 Hemiplegia and hemiparesis following unspecified cerebrovascular disease affecting unspecified side: Secondary | ICD-10-CM | POA: Diagnosis not present

## 2014-04-13 DIAGNOSIS — M899 Disorder of bone, unspecified: Secondary | ICD-10-CM | POA: Diagnosis not present

## 2014-04-13 DIAGNOSIS — M79609 Pain in unspecified limb: Secondary | ICD-10-CM | POA: Diagnosis not present

## 2014-04-13 DIAGNOSIS — M949 Disorder of cartilage, unspecified: Secondary | ICD-10-CM | POA: Diagnosis not present

## 2014-04-13 DIAGNOSIS — R609 Edema, unspecified: Secondary | ICD-10-CM | POA: Diagnosis not present

## 2014-04-13 DIAGNOSIS — M25819 Other specified joint disorders, unspecified shoulder: Secondary | ICD-10-CM | POA: Diagnosis not present

## 2014-04-13 DIAGNOSIS — E786 Lipoprotein deficiency: Secondary | ICD-10-CM | POA: Diagnosis not present

## 2014-04-13 DIAGNOSIS — E236 Other disorders of pituitary gland: Secondary | ICD-10-CM | POA: Diagnosis not present

## 2014-04-13 DIAGNOSIS — R946 Abnormal results of thyroid function studies: Secondary | ICD-10-CM | POA: Diagnosis not present

## 2014-04-13 DIAGNOSIS — E1149 Type 2 diabetes mellitus with other diabetic neurological complication: Secondary | ICD-10-CM | POA: Diagnosis not present

## 2014-04-13 DIAGNOSIS — E785 Hyperlipidemia, unspecified: Secondary | ICD-10-CM | POA: Diagnosis not present

## 2014-04-13 DIAGNOSIS — M7580 Other shoulder lesions, unspecified shoulder: Secondary | ICD-10-CM

## 2014-04-13 NOTE — Patient Instructions (Signed)

## 2014-04-13 NOTE — Progress Notes (Signed)
Botox Injection for spasticity using needle EMG guidance  Dilution: 50 Units/ml Indication: Severe spasticity which interferes with ADL,mobility and/or  hygiene and is unresponsive to medication management and other conservative care Informed consent was obtained after describing risks and benefits of the procedure with the patient. This includes bleeding, bruising, infection, excessive weakness, or medication side effects. A REMS form is on file and signed. Needle: 27-gauge 1 inch needle electrode Number of units per muscle  Brachioradialis50U FCR50U FCU25 FDS75 [id number] Adductor pollicis 25 All injections were done after obtaining appropriate EMG activity and after negative drawback for blood. The patient tolerated the procedure well. Post procedure instructions were given. A followup appointment was made.

## 2014-04-23 ENCOUNTER — Other Ambulatory Visit: Payer: Self-pay | Admitting: Family Medicine

## 2014-05-05 ENCOUNTER — Ambulatory Visit: Payer: Medicare Other | Admitting: Physical Medicine & Rehabilitation

## 2014-05-26 ENCOUNTER — Ambulatory Visit (HOSPITAL_BASED_OUTPATIENT_CLINIC_OR_DEPARTMENT_OTHER): Payer: Medicare Other | Admitting: Physical Medicine & Rehabilitation

## 2014-05-26 ENCOUNTER — Encounter: Payer: Self-pay | Admitting: Physical Medicine & Rehabilitation

## 2014-05-26 ENCOUNTER — Encounter: Payer: Medicare Other | Attending: Physical Medicine & Rehabilitation

## 2014-05-26 VITALS — BP 153/68 | HR 76 | Resp 14 | Wt 188.0 lb

## 2014-05-26 DIAGNOSIS — I69959 Hemiplegia and hemiparesis following unspecified cerebrovascular disease affecting unspecified side: Secondary | ICD-10-CM | POA: Insufficient documentation

## 2014-05-26 DIAGNOSIS — I6350 Cerebral infarction due to unspecified occlusion or stenosis of unspecified cerebral artery: Secondary | ICD-10-CM

## 2014-05-26 DIAGNOSIS — M7580 Other shoulder lesions, unspecified shoulder: Secondary | ICD-10-CM

## 2014-05-26 DIAGNOSIS — M25819 Other specified joint disorders, unspecified shoulder: Secondary | ICD-10-CM | POA: Insufficient documentation

## 2014-05-26 DIAGNOSIS — M79609 Pain in unspecified limb: Secondary | ICD-10-CM | POA: Insufficient documentation

## 2014-05-26 DIAGNOSIS — G8110 Spastic hemiplegia affecting unspecified side: Secondary | ICD-10-CM | POA: Diagnosis not present

## 2014-05-26 NOTE — Progress Notes (Signed)
Botox injections 04/13/2014 Good improvement with finger extension as well as arm extension however has weak grip.  Brachioradialis50U FCR50U FCU25 FDS75 BWI20 Adductor pollicis 25    Examination decreased grip in the second third fourth and fifth digits graded is 3 minus/5  Tone Ashworth grade 0 at the finger flexors Ashworth grade 1 at the elbow flexors  Patient has full extension of the fingers with the exception of the fifth digit Good abduction of the thumb  Impression Right spastic hemiplegia following CVA. Good improvement with finger extension however At the expense o decreased grip in the fingers of the right hand.  Recommendations for subsequent injection would be to reduce dose to 50 units at the FDS and 50 units at the FDP Total of 250 units

## 2014-05-26 NOTE — Progress Notes (Signed)
Subjective:    Patient ID: James Bell, male    DOB: 18-Apr-1931, 80 y.o.   MRN: [id number]  HPI   Pain Inventory Average Pain 0 Pain Right Now 0 My pain is no pain  In the last 24 hours, has pain interfered with the following? General activity 0 Relation with others 0 Enjoyment of life 0 What TIME of day is your pain at its worst? no pain Sleep (in general) no pain  Pain is worse with: no pain Pain improves with: no pain Relief from Meds: no pain  Mobility use a cane  Function disabled: date disabled after CVA was PCP MD  Neuro/Psych weakness numbness trouble walking  Prior Studies Any changes since last visit?  no  Physicians involved in your care Any changes since last visit?  no   Family History  Problem Relation Age of Onset  . Stroke Father   . Lung cancer Father 40     smoker  . Stroke Brother   . Diabetes Brother 34  . Coronary artery disease Neg Hx   . Lung cancer Brother   . Alcoholism Sister   . Kidney disease Sister    History   Social History  . Marital Status: Unknown    Spouse Name: N/A    Number of Children: 2  . Years of Education: MD   Occupational History  . retired   .     Social History Main Topics  . Smoking status: Never Smoker   . Smokeless tobacco: Never Used     Comment: "used to smoke pipe when I was younger"  . Alcohol Use: 2.4 oz/week    2 Glasses of wine, 2 Shots of liquor per week     Comment: occasional  . Drug Use: No  . Sexual Activity: Yes   Other Topics Concern  . None   Social History Narrative   Caffeine: 1 cup coffee/day   Divorced   Occupation: ophthalmologist   Edu: MD   Activity: rehab s/p CVA   Diet: some water, fruits/vegetables daily   POA - [name] (Daughter)   Past Surgical History  Procedure Laterality Date  . Prostate surgery  02/2009    PVP (laser)  . Tonsillectomy and adenoidectomy  ~ 1945  . Hemorrhoid surgery  1991  . Cataract extraction w/ intraocular lens  implant  11//2012 - 08/2011  . Inguinal hernia repair  1981; 2002    left, then repaired  . Skin cancer excision  2002    bridge of nose, [facility]  . Colonoscopy  05/2011    normal per pt (Dr. [name], [place], [name])  . Mri brain  04/2010    WNL  . Esophagogastroduodenoscopy  2006    duodenitis, medual HH, Grade 2 reflux, mild gastritis  . Colonoscopy  03/2011    poor prep, unable to biopsy polyp in tic fold, rpt 3 mo [name])  . Abi  2007    WNL  . Urethral dilation  2014    tannenbaum  . Cystoscopy with urethral dilatation N/A 07/24/2013    Procedure: CYSTOSCOPY WITH URETHRAL DILATATION;  Surgeon: [name], MD;  Location: [facility];  Service: Urology;  Laterality: N/A;  . Transurethral incision of prostate N/A 07/24/2013    Procedure: TRANSURETHRAL INCISION OF THE PROSTATE (TUIP);  Surgeon: [name], MD;  Location: [facility];  Service: Urology;  Laterality: N/A;  . Esophagogastroduodenoscopy  07/2011    mild chronic gastritis  . Colonoscopy  06/2011    mult polyps adenomatous, severe diverticulosis, rpt 3 yrs [name])  . Carotid endarterectomy    . Cardiac catheterization  2004    normal; [facility]  . Cardiovascular stress test  02/2014    WNL, low risk scan, EF 68%   Past Medical History  Diagnosis Date  . HLD (hyperlipidemia)   . Restless leg syndrome     well controlled on mirapex  . OSA on CPAP     sleep study 01/2014 - severe, CPAP at 10, previously on nuvigil (Dr. [name])  . Type 2 diabetes, controlled, with neuropathy 2011  . H/O hiatal hernia   . GERD (gastroesophageal reflux disease)     with sliding HH  . Migraines 03/11/12    now rare  . CVA (cerebral infarction) 03/07/12    slurred speech; right hemplegia, left handed  . Basal cell carcinoma of nose 2002    bridge of nose  . History of kidney problems 12/2010    lacerated left kidney after fall  . BPH with obstruction/lower urinary tract symptoms     failed flomax, uroxatral, myrbetriq - sees  urology [name]) pending videourodynamics  . Depression     mild, on cymbalta per prior PCP  . Palpitations 01/2012    holter WNL, rare PAC/PVCs  . Anemia   . Erectile dysfunction     per urology (prostaglandin injection)  . Kidney cysts 02/2014    bilateral (renal US)  . CKD (chronic kidney disease) stage 3, GFR 30-59 ml/min 03/11/2012    Renal US 02/2014 - multiple kidney cysts   . Stroke   . TIA (transient ischemic attack)    BP 153/68  Pulse 76  Resp 14  Wt 188 lb (85.276 kg)  SpO2 96%  Opioid Risk Score:   Fall Risk Score: High Fall Risk (>13 points) (previoulsy educated and handout declined)  Review of Systems  Musculoskeletal: Positive for gait problem.  Neurological: Positive for weakness.  All other systems reviewed and are negative.      Objective:   Physical Exam        Assessment & Plan:

## 2014-06-09 ENCOUNTER — Ambulatory Visit (INDEPENDENT_AMBULATORY_CARE_PROVIDER_SITE_OTHER): Payer: Medicare Other | Admitting: Family Medicine

## 2014-06-09 ENCOUNTER — Encounter: Payer: Self-pay | Admitting: Family Medicine

## 2014-06-09 VITALS — BP 128/70 | HR 64 | Temp 98.1°F | Wt 184.2 lb

## 2014-06-09 DIAGNOSIS — E1142 Type 2 diabetes mellitus with diabetic polyneuropathy: Secondary | ICD-10-CM

## 2014-06-09 DIAGNOSIS — E1149 Type 2 diabetes mellitus with other diabetic neurological complication: Secondary | ICD-10-CM | POA: Diagnosis not present

## 2014-06-09 DIAGNOSIS — N183 Chronic kidney disease, stage 3 unspecified: Secondary | ICD-10-CM

## 2014-06-09 DIAGNOSIS — M7989 Other specified soft tissue disorders: Secondary | ICD-10-CM | POA: Diagnosis not present

## 2014-06-09 DIAGNOSIS — I639 Cerebral infarction, unspecified: Secondary | ICD-10-CM

## 2014-06-09 DIAGNOSIS — I6350 Cerebral infarction due to unspecified occlusion or stenosis of unspecified cerebral artery: Secondary | ICD-10-CM | POA: Diagnosis not present

## 2014-06-09 DIAGNOSIS — Z23 Encounter for immunization: Secondary | ICD-10-CM | POA: Diagnosis not present

## 2014-06-09 DIAGNOSIS — E1140 Type 2 diabetes mellitus with diabetic neuropathy, unspecified: Secondary | ICD-10-CM

## 2014-06-09 DIAGNOSIS — E785 Hyperlipidemia, unspecified: Secondary | ICD-10-CM | POA: Diagnosis not present

## 2014-06-09 NOTE — Patient Instructions (Signed)
Flu shot today. Pass by marion's office to schedule ultrasound of right leg. Keep legs elevated as much as able. Continue medications as up to now. Return to see me in 4 months for follow up, sooner if needed. Fasting labwork at that appointment.

## 2014-06-09 NOTE — Assessment & Plan Note (Signed)
Chronic, stable. Continue f/u per endo.

## 2014-06-09 NOTE — Progress Notes (Signed)
Pre visit review using our clinic review tool, if applicable. No additional management support is needed unless otherwise documented below in the visit note. 

## 2014-06-09 NOTE — Assessment & Plan Note (Signed)
Due to small vessel disease. Continue aspirin/plavix.

## 2014-06-09 NOTE — Assessment & Plan Note (Signed)
Intermittent over last several months, worse over this past month after he had car ride to beach (although he did have compression stockings). Check RLE ultrasound today given comorbidities. Encouraged elevation of leg, avoiding sodium, and use of compression stockings. Pt agrees with plan.

## 2014-06-09 NOTE — Progress Notes (Signed)
BP 128/70  Pulse 64  Temp(Src) 98.1 F (36.7 C) (Oral)  Wt 184 lb 4 oz (83.575 kg)   CC: 4 mo f/u visit  Subjective:    Patient ID: James Bell, male    DOB: 1930/11/29, 80 y.o.   MRN: [id number]  HPI: James Bell is a 80 y.o. male presenting on 06/09/2014 for Follow-up   L shoulder pain improving.  Intermittent swelling of R leg, worse recently. Ongoing for last several months. Did recently take trip down to beach, did wear compression stocking during that trip. Doesn't like compression stockings.   DM - followed by Dr. [name] - changed from [place] to invokana - but this led to yeast infection so he has changed back to janumet bid. Also on lantus 60u daily.  CKD - stable overall. Continues losartan and metoprolol.  HLD - on lipitor 40mg  nightly as well as lovaza 2g bid.   Doesn't regularly take trazodone.   H/o CVA - on aspirin and plavix daily.  OSA on CPAP 10 cmH2O.  Past Medical History  Diagnosis Date  . HLD (hyperlipidemia)   . Restless leg syndrome     well controlled on mirapex  . OSA on CPAP     sleep study 01/2014 - severe, CPAP at 10, previously on nuvigil (Dr. [name])  . Type 2 diabetes, controlled, with neuropathy 2011  . H/O hiatal hernia   . GERD (gastroesophageal reflux disease)     with sliding HH  . Migraines 03/11/12    now rare  . CVA (cerebral infarction) 03/07/12    slurred speech; right hemplegia, left handed  . Basal cell carcinoma of nose 2002    bridge of nose  . History of kidney problems 12/2010    lacerated left kidney after fall  . BPH with obstruction/lower urinary tract symptoms     failed flomax, uroxatral, myrbetriq - sees urology [name]) pending videourodynamics  . Depression     mild, on cymbalta per prior PCP  . Palpitations 01/2012    holter WNL, rare PAC/PVCs  . Anemia   . Erectile dysfunction     per urology (prostaglandin injection)  . Kidney cysts 02/2014    bilateral (renal US)  . CKD (chronic  kidney disease) stage 3, GFR 30-59 ml/min 03/11/2012    Renal US 02/2014 - multiple kidney cysts   . Stroke   . TIA (transient ischemic attack)     Relevant past medical, surgical, family and social history reviewed and updated as indicated.  Allergies and medications reviewed and updated. Current Outpatient Prescriptions on File Prior to Visit  Medication Sig  . atorvastatin (LIPITOR) 40 MG tablet TAKE 1 TABLET DAILY  . cetirizine (ZYRTEC) 10 MG tablet Take 10 mg by mouth as needed for allergies.  Marland Kitchen clopidogrel (PLAVIX) 75 MG tablet TAKE 1 TABLET DAILY  . Coenzyme Q10 (COQ10) 150 MG CAPS Take 1 capsule by mouth 2 (two) times daily.  . diazepam (VALIUM) 5 MG tablet Take 1 tablet (5 mg total) by mouth every 8 (eight) hours as needed for anxiety.  . DULoxetine (CYMBALTA) 60 MG capsule TAKE 1 CAPSULE DAILY  . Folic Acid-Vit L2-XNT Z00 (FOLBEE) 2.5-25-1 MG TABS tablet Take 1 tablet by mouth daily.  Marland Kitchen losartan (COZAAR) 25 MG tablet Take 1 tablet (25 mg total) by mouth daily.  . metoprolol tartrate (LOPRESSOR) 25 MG tablet Take 1 tablet (25 mg total) by mouth 2 (two) times daily.  Marland Kitchen NITROSTAT 0.4 MG SL tablet  PLACE 1 TABLET UNDER THE TONGUE EVERY 5 MINUTES AS NEEDED FOR CHEST PAIN  . NITROSTAT 0.4 MG SL tablet DISSOLVE 1 TABLET UNDER THE TONGUE EVERY 5 MINUTES AS NEEDED FOR CHEST PAIN  . NOVOFINE 30G X 8 MM MISC USE AS DIRECTED ONE DAILY  . omega-3 acid ethyl esters (LOVAZA) 1 G capsule Take 2 capsules (2 g total) by mouth 2 (two) times daily.  Marland Kitchen omeprazole (PRILOSEC) 40 MG capsule Take 1 capsule (40 mg total) by mouth 2 (two) times daily as needed.  . pramipexole (MIRAPEX) 1 MG tablet TAKE 1 TABLET AT BEDTIME  . sitaGLIPtin-metformin (JANUMET) 50-1000 MG per tablet Take 1 tablet by mouth 2 (two) times daily with a meal.  . traZODone (DESYREL) 25 mg TABS tablet Take 0.5 tablets (25 mg total) by mouth at bedtime.  . vitamin C (ASCORBIC ACID) 500 MG tablet Take 1,000 mg by mouth daily.    No  current facility-administered medications on file prior to visit.    Review of Systems Per HPI unless specifically indicated above    Objective:    BP 128/70  Pulse 64  Temp(Src) 98.1 F (36.7 C) (Oral)  Wt 184 lb 4 oz (83.575 kg)  Physical Exam  Nursing note and vitals reviewed. Constitutional: He appears well-developed and well-nourished. No distress.  Cardiovascular: Normal rate, regular rhythm, normal heart sounds and intact distal pulses.   No murmur heard. Pulmonary/Chest: Effort normal and breath sounds normal. No respiratory distress. He has no wheezes. He has no rales.  Musculoskeletal: He exhibits edema (1+ on right, tr on left).  No palpable cords Diminished pulses bilaterally Calf circ on right 35cm Calc circ on left 36cm  Neurological:  Chronic R hemiparesis  Skin: Skin is warm and dry. No rash noted.  Psychiatric: He has a normal mood and affect.   Results for orders placed in visit on 02/06/14  RENAL FUNCTION PANEL      Result Value Ref Range   Sodium 138  135 - 145 mEq/L   Potassium 4.4  3.5 - 5.1 mEq/L   Chloride 104  96 - 112 mEq/L   CO2 27  19 - 32 mEq/L   Calcium 8.9  8.4 - 10.5 mg/dL   Albumin 3.6  3.5 - 5.2 g/dL   BUN 21  6 - 23 mg/dL   Creatinine, Ser 1.4  0.4 - 1.5 mg/dL   Glucose, Bld 88  70 - 99 mg/dL   Phosphorus 3.5  2.3 - 4.6 mg/dL   GFR 52.61 (*) >60.00 mL/min  LIPID PANEL      Result Value Ref Range   Cholesterol 102  0 - 200 mg/dL   Triglycerides 233.0 (*) 0.0 - 149.0 mg/dL   HDL 19.80 (*) >39.00 mg/dL   VLDL 46.6 (*) 0.0 - 40.0 mg/dL   LDL Cholesterol 36  0 - 99 mg/dL   Total CHOL/HDL Ratio 5    PROTEIN ELECTROPHORESIS, SERUM      Result Value Ref Range   Total Protein, Serum Electrophoresis 6.9  6.0 - 8.3 g/dL   Albumin ELP 55.8  55.8 - 66.1 %   Alpha-1-Globulin 3.5  2.9 - 4.9 %   Alpha-2-Globulin 12.5 (*) 7.1 - 11.8 %   Beta Globulin 7.2  4.7 - 7.2 %   Beta 2 9.2 (*) 3.2 - 6.5 %   Gamma Globulin 11.8  11.1 - 18.8 %    M-Spike, % NOT DET     SPE Interp. SEE NOTE  COMMENT (PROTEIN ELECTROPHOR) SEE NOTE        Assessment & Plan:   Problem List Items Addressed This Visit   Type 2 diabetes, controlled, with neuropathy     Chronic, stable. Continue f/u per endo.    Relevant Medications      Insulin Glargine (LANTUS SOLOSTAR) 100 UNIT/ML Solostar Pen   Right leg swelling - Primary     Intermittent over last several months, worse over this past month after he had car ride to beach (although he did have compression stockings). Check RLE ultrasound today given comorbidities. Encouraged elevation of leg, avoiding sodium, and use of compression stockings. Pt agrees with plan.    Relevant Orders      Lower Extremity Venous Duplex Right   HLD (hyperlipidemia)     Continue lipitor 40mg  daily and lovaza 2gm bid    CVA (cerebrovascular accident)     Due to small vessel disease. Continue aspirin/plavix.     CKD (chronic kidney disease) stage 3, GFR 30-59 ml/min     Check labwork next visit.        Follow up plan: Return in about 4 months (around 10/09/2014), or if symptoms worsen or fail to improve, for follow up visit.

## 2014-06-09 NOTE — Assessment & Plan Note (Signed)
Check labwork next visit.

## 2014-06-09 NOTE — Assessment & Plan Note (Signed)
Continue lipitor 40mg  daily and lovaza 2gm bid

## 2014-06-11 DIAGNOSIS — H26499 Other secondary cataract, unspecified eye: Secondary | ICD-10-CM | POA: Diagnosis not present

## 2014-06-11 DIAGNOSIS — E119 Type 2 diabetes mellitus without complications: Secondary | ICD-10-CM | POA: Diagnosis not present

## 2014-06-11 DIAGNOSIS — H5210 Myopia, unspecified eye: Secondary | ICD-10-CM | POA: Diagnosis not present

## 2014-06-16 ENCOUNTER — Encounter (INDEPENDENT_AMBULATORY_CARE_PROVIDER_SITE_OTHER): Payer: Medicare Other

## 2014-06-16 DIAGNOSIS — M7989 Other specified soft tissue disorders: Secondary | ICD-10-CM

## 2014-07-06 ENCOUNTER — Other Ambulatory Visit: Payer: Self-pay | Admitting: Family Medicine

## 2014-07-07 ENCOUNTER — Ambulatory Visit (HOSPITAL_BASED_OUTPATIENT_CLINIC_OR_DEPARTMENT_OTHER): Payer: Medicare Other | Admitting: Physical Medicine & Rehabilitation

## 2014-07-07 ENCOUNTER — Encounter: Payer: Medicare Other | Attending: Physical Medicine & Rehabilitation

## 2014-07-07 ENCOUNTER — Encounter: Payer: Self-pay | Admitting: Physical Medicine & Rehabilitation

## 2014-07-07 VITALS — BP 122/49 | HR 74 | Resp 14 | Wt 192.0 lb

## 2014-07-07 DIAGNOSIS — G8111 Spastic hemiplegia affecting right dominant side: Secondary | ICD-10-CM

## 2014-07-07 DIAGNOSIS — G8110 Spastic hemiplegia affecting unspecified side: Secondary | ICD-10-CM | POA: Insufficient documentation

## 2014-07-07 NOTE — Progress Notes (Signed)
Botox Injection for spasticity using needle EMG guidance  Dilution: 50 Units/ml Indication: Severe spasticity which interferes with ADL,mobility and/or  hygiene and is unresponsive to medication management and other conservative care Informed consent was obtained after describing risks and benefits of the procedure with the patient. This includes bleeding, bruising, infection, excessive weakness, or medication side effects. A REMS form is on file and signed. Needle: 25 g 2" needle electrode Number of units per muscle  [id number] [id number] FDP50  All injections were done after obtaining appropriate EMG activity and after negative drawback for blood. The patient tolerated the procedure well. Post procedure instructions were given. A followup appointment was made.

## 2014-07-14 DIAGNOSIS — E1142 Type 2 diabetes mellitus with diabetic polyneuropathy: Secondary | ICD-10-CM | POA: Diagnosis not present

## 2014-07-14 DIAGNOSIS — E785 Hyperlipidemia, unspecified: Secondary | ICD-10-CM | POA: Diagnosis not present

## 2014-07-16 ENCOUNTER — Ambulatory Visit
Payer: Medicare Other | Attending: Physical Medicine & Rehabilitation | Admitting: Rehabilitative and Restorative Service Providers"

## 2014-07-16 ENCOUNTER — Other Ambulatory Visit: Payer: Self-pay | Admitting: Family Medicine

## 2014-07-23 ENCOUNTER — Other Ambulatory Visit: Payer: Self-pay | Admitting: Family Medicine

## 2014-07-27 ENCOUNTER — Other Ambulatory Visit: Payer: Self-pay | Admitting: *Deleted

## 2014-07-27 MED ORDER — OMEGA-3-ACID ETHYL ESTERS 1 G PO CAPS: ORAL_CAPSULE | ORAL | Status: DC

## 2014-07-28 ENCOUNTER — Ambulatory Visit: Payer: Medicare Other | Attending: Physical Medicine & Rehabilitation

## 2014-07-28 ENCOUNTER — Ambulatory Visit: Payer: Medicare Other | Admitting: Occupational Therapy

## 2014-07-28 DIAGNOSIS — R269 Unspecified abnormalities of gait and mobility: Secondary | ICD-10-CM | POA: Diagnosis not present

## 2014-07-28 DIAGNOSIS — I6980 Unspecified sequelae of other cerebrovascular disease: Secondary | ICD-10-CM | POA: Diagnosis not present

## 2014-07-28 DIAGNOSIS — R609 Edema, unspecified: Secondary | ICD-10-CM | POA: Insufficient documentation

## 2014-07-28 DIAGNOSIS — R279 Unspecified lack of coordination: Secondary | ICD-10-CM | POA: Diagnosis not present

## 2014-07-28 DIAGNOSIS — IMO0002 Reserved for concepts with insufficient information to code with codable children: Secondary | ICD-10-CM

## 2014-07-28 DIAGNOSIS — R531 Weakness: Secondary | ICD-10-CM | POA: Diagnosis not present

## 2014-07-28 DIAGNOSIS — I69898 Other sequelae of other cerebrovascular disease: Secondary | ICD-10-CM | POA: Insufficient documentation

## 2014-07-28 NOTE — Therapy (Signed)
Physical Therapy Evaluation  Patient Details  Name: James Bell MRN: [id number] Date of Birth: 03/10/1931  Encounter Date: 07/28/2014      PT End of Session - 07/28/14 1526    Visit Number 1  1/10   Number of Visits 17   Authorization Type G-code required every 10th visit for Medicare.   PT Start Time 1016   PT Stop Time 1102   PT Time Calculation (min) 46 min   Equipment Utilized During Treatment Gait belt   Activity Tolerance Patient tolerated treatment well      Past Medical History  Diagnosis Date  . HLD (hyperlipidemia)   . Restless leg syndrome     well controlled on mirapex  . OSA on CPAP     sleep study 01/2014 - severe, CPAP at 10, previously on nuvigil (Dr. [name])  . Type 2 diabetes, controlled, with neuropathy 2011  . H/O hiatal hernia   . GERD (gastroesophageal reflux disease)     with sliding HH  . Migraines 03/11/12    now rare  . CVA (cerebral infarction) 03/07/12    slurred speech; right hemplegia, left handed  . Basal cell carcinoma of nose 2002    bridge of nose  . History of kidney problems 12/2010    lacerated left kidney after fall  . BPH with obstruction/lower urinary tract symptoms     failed flomax, uroxatral, myrbetriq - sees urology [name]) pending videourodynamics  . Depression     mild, on cymbalta per prior PCP  . Palpitations 01/2012    holter WNL, rare PAC/PVCs  . Anemia   . Erectile dysfunction     per urology (prostaglandin injection)  . Kidney cysts 02/2014    bilateral (renal US)  . CKD (chronic kidney disease) stage 3, GFR 30-59 ml/min 03/11/2012    Renal US 02/2014 - multiple kidney cysts   . Stroke   . TIA (transient ischemic attack)     Past Surgical History  Procedure Laterality Date  . Prostate surgery  02/2009    PVP (laser)  . Tonsillectomy and adenoidectomy  ~ 1945  . Hemorrhoid surgery  1991  . Cataract extraction w/ intraocular lens implant  11//2012 - 08/2011  . Inguinal hernia repair  1981; 2002     left, then repaired  . Skin cancer excision  2002    bridge of nose, BCC  . Colonoscopy  05/2011    normal per pt (Dr. [name], [place], [name])  . Mri brain  04/2010    WNL  . Esophagogastroduodenoscopy  2006    duodenitis, medual HH, Grade 2 reflux, mild gastritis  . Colonoscopy  03/2011    poor prep, unable to biopsy polyp in tic fold, rpt 3 mo [name])  . Abi  2007    WNL  . Urethral dilation  2014    tannenbaum  . Cystoscopy with urethral dilatation N/A 07/24/2013    Procedure: CYSTOSCOPY WITH URETHRAL DILATATION;  Surgeon: [name], MD;  Location: [facility];  Service: Urology;  Laterality: N/A;  . Transurethral incision of prostate N/A 07/24/2013    Procedure: TRANSURETHRAL INCISION OF THE PROSTATE (TUIP);  Surgeon: [name], MD;  Location: [facility];  Service: Urology;  Laterality: N/A;  . Esophagogastroduodenoscopy  07/2011    mild chronic gastritis  . Colonoscopy  06/2011    mult polyps adenomatous, severe diverticulosis, rpt 3 yrs [name])  . Carotid endarterectomy    . Cardiac catheterization  2004  normal; [facility]  . Cardiovascular stress test  02/2014    WNL, low risk scan, EF 68%    There were no vitals taken for this visit.  Visit Diagnosis:  Abnormality of gait - Plan: PT plan of care cert/re-cert  Weakness due to cerebrovascular accident - Plan: PT plan of care cert/re-cert  Lack of coordination due to stroke - Plan: PT plan of care cert/re-cert      Subjective Assessment - 07/28/14 1517    Symptoms Impaired balance, falls, decreased strength, and unable to ambulate longer distances.   Patient Stated Goals To build strength in his legs and increase his ambulation distance without quad cane if possible.   Currently in Pain? No/denies          [facility] PT Assessment - 07/28/14 0700    Precautions   Precautions Fall   Restrictions   Weight Bearing Restrictions No   Balance Screen   Has the patient fallen in the past 6 months Yes    How many times? 1   Has the patient had a decrease in activity level because of a fear of falling?  Yes   Is the patient reluctant to leave their home because of a fear of falling?  No   Home Environment   Living Enviornment Private residence   Living Arrangements Alone   Type of [place] --  no stairs   Home Layout One level   [place] - quad;Shower seat;Grab bars - toilet;Grab bars - tub/shower  hemi walker, nu-step    Prior Function   Level of Independence Independent with basic ADLs;Independent with gait;Independent with transfers;Independent with homemaking with ambulation   Cognition   Overall Cognitive Status Within Functional Limits for tasks assessed   Observation/Other Assessments   Other Surveys  Select   Stroke Impact Scale  52.8%   Sensation   Light Touch Appears Intact   Coordination   Gross Motor Movements are Fluid and Coordinated No   Fine Motor Movements are Fluid and Coordinated No   Coordination and Movement Description R UE difficulty with rapid movements.   Finger Nose Finger Test R hand difficulty with fine motor movements, pt recently recieved botox to decr. R UE spasticity but now has difficulty with hand grip strength.   Posture/Postural Control   Posture/Postural Control Postural limitations   Postural Limitations Rounded Shoulders;Forward head;Flexed trunk   Posture Comments Pt reported low back pain occurs after ambulating >5 minutes, and he believes it could be related to his flexed trunk.   AROM   Overall AROM  Deficits   Overall AROM Comments R UE shoulder flex limited to approx. 120 degrees, pt can reach behind head and back but can not hold R UE in place for >2-3 seconds.   Strength   Overall Strength Deficits   Overall Strength Comments R LE: globally 4/5, L LE globally 5/5 except 4/5 dorsiflexion.   Transfers   Transfers Sit to Stand;Stand to Sit   Sit to Stand 4: Min guard;4: Min assist;With upper extremity  assist;With armrests;From chair/3-in-1   Sit to Stand Details (indicate cue type and reason) Min A required to maintain balance without use of B UE, pt required several attempts to perform with UE support during first rep, he was then able to stand with B UE assist and CGA.   Stand to Sit 4: Min guard;With upper extremity assist;With armrests;To chair/3-in-1  to ensure safety.   Ambulation/Gait   Ambulation/Gait Yes  Ambulation/Gait Assistance 4: Min guard   Ambulation Distance (Feet) 150 Feet   Assistive device Small based quad cane   Gait Pattern Step-through pattern;Decreased step length - left;Decreased stance time - right;Decreased dorsiflexion - right;Decreased dorsiflexion - left;Scissoring;Narrow base of support;Trunk flexed;Trendelenburg  decreased hip rotation   Gait velocity 2.39ft/sec.   Standardized Balance Assessment   Standardized Balance Assessment Timed Up and Go Test;Berg Balance Test   Berg Balance Test   Sit to Stand Able to stand using hands after several tries   Standing Unsupported Able to stand 2 minutes with supervision   Sitting with Back Unsupported but Feet Supported on Floor or Stool Able to sit 2 minutes under supervision   Stand to Sit Controls descent by using hands   Transfers Able to transfer safely, definite need of hands   Standing Unsupported with Eyes Closed Able to stand 10 seconds with supervision   Standing Ubsupported with Feet Together Able to place feet together independently and stand for 1 minute with supervision   From Standing, Reach Forward with Outstretched Arm Can reach forward >12 cm safely (5")   From Standing Position, Pick up Object from Floor Able to pick up shoe, needs supervision   From Standing Position, Turn to Look Behind Over each Shoulder Looks behind one side only/other side shows less weight shift   Turn 360 Degrees Able to turn 360 degrees safely but slowly   Standing Unsupported, Alternately Place Feet on Step/Stool Able to  complete 4 steps without aid or supervision   Standing Unsupported, One Foot in Front Able to take small step independently and hold 30 seconds   Standing on One Leg Tries to lift leg/unable to hold 3 seconds but remains standing independently  36/56: high falls risk.   Timed Up and Go Test   TUG Normal TUG   Normal TUG (seconds) 14.73   TUG Comments Pt used quad cane and had difficulty with turns and narrowed BOS.              PT Short Term Goals - 07/28/14 1535    PT SHORT TERM GOAL #1   Title Pt will be independent in HEP to improve strength and functional mobility.   Time --  08/25/14   Status New   PT SHORT TERM GOAL #2   Title Pt will be improve BERG score to >/=40/56 to decrease falls risk.   Time --  08/25/14   Status New   PT SHORT TERM GOAL #3   Title Pt will perform TUG, with LRAD, in </=13 seconds to decrease falls risk.   Time --  08/25/14   Status New   PT SHORT TERM GOAL #4   Title Pt will improve gait speed to >/= 2.15ft/sec., with LRAD, to improve functional mobility.   Time --  08/25/14   Status New   PT SHORT TERM GOAL #5   Title Pt will ambulate 400' with LRAD, over even/uneven terrain, with supervision to improve functional mobility.   Time --  08/25/14   Status New          PT Long Term Goals - 07/28/14 1539    PT LONG TERM GOAL #1   Title Pt will be able to verbalize understanding of falls prevention strategies to reduce risk of falls.   Time --  09/22/14   Status New   PT LONG TERM GOAL #2   Title Pt will improve BERG score to >/=44/56 to decrease falls risk.   Time --  09/22/14   Status New   PT LONG TERM GOAL #3   Title Pt will improve gait speed, with LRAD, to >/=3.66ft/sec. to improve functional mobility.   Time --  09/22/14   Status New   PT LONG TERM GOAL #4   Title Pt will be able to ambulate >/= 15 minutes without back pain.   Time --  09/22/14   Status New   PT LONG TERM GOAL #5   Title Pt will be able to ambulate 600', with  LRAD, over even/uneven terrain at MOD I level to improve functional mobility.   Time --  09/22/14   Status New   Additional Long Term Goals   Additional Long Term Goals Yes   PT LONG TERM GOAL #6   Title Pt will improve SIS-mobility score by 10% to improve quality of life.   Time --  10/02/14   Status New          Plan - August 19, 2014 1527    Clinical Impression Statement Pt is a pleasant 80 y/o male presenting to OPPT neuro with c/o impaired balance, difficulty ambulating long distances, and R-sided weakness from 02/2012 CVA. Pt reported he has noticed a general decline in functional mobility since ceasing therapy after 02/2012 CVA. Pt has experienced one fall in the last six months and has noticed a gradual decr. in mobility over the last several months. Pt would benefit from skilled therapy to address the impairments listed below and to improve his safety during functional mobility.   Pt will benefit from skilled therapeutic intervention in order to improve on the following deficits Abnormal gait;Decreased endurance;Decreased balance;Decreased mobility;Decreased strength;Decreased knowledge of use of DME;Decreased range of motion;Impaired flexibility;Impaired UE functional use   Rehab Potential Good   PT Frequency 2x / week   PT Duration 8 weeks   PT Treatment/Interventions ADLs/Self Care Home Management;DME Instruction;Balance training;Manual techniques;Therapeutic exercise;Therapeutic activities;Patient/family education;Functional mobility training;Stair training;Gait training;Neuromuscular re-education   PT Next Visit Plan Initiate Balance/strengthening (core and R LE) HEP, trial ambulation with and without quad cane.   Recommended Other Services Pt is scheduled for OT eval to address R UE and ADLs impairments.   Consulted and Agree with Plan of Care Patient          G-Codes - 08-19-2014 1544    Functional Assessment Tool Used TUG with quad cane: 14.73 sec.; BERG: 36/56; gait velcoity:  2.45ft/sec. with quad cane   Functional Limitation Mobility: Walking and moving around   Mobility: Walking and Moving Around Current Status (C5852) At least 40 percent but less than 60 percent impaired, limited or restricted   Mobility: Walking and Moving Around Goal Status [id number]) At least 1 percent but less than 20 percent impaired, limited or restricted      Problem List Patient Active Problem List   Diagnosis Date Noted  . Spastic hemiparesis affecting dominant side 07/07/2014  . Right leg swelling 06/09/2014  . Left shoulder pain 03/16/2014  . Chest pressure 02/03/2014  . Spastic hemiplegia affecting dominant side 11/07/2013  . Medicare annual wellness visit, subsequent 11/06/2013  . ED (erectile dysfunction) 02/05/2013  . Depression   . Palpitations 06/26/2012  . HLD (hyperlipidemia)   . Restless leg syndrome   . GERD (gastroesophageal reflux disease)   . BPH with obstruction/lower urinary tract symptoms   . CVA (cerebrovascular accident) 03/11/2012  . Right hemiparesis 03/11/2012  . Type 2 diabetes, controlled, with neuropathy 03/11/2012  . OSA (obstructive sleep apnea) 03/11/2012  . CKD (chronic kidney  disease) stage 3, GFR 30-59 ml/min 03/11/2012  . Migraines 03/11/2012                                              Pleshette Tomasini L 07/28/2014, 3:50 PM

## 2014-07-29 ENCOUNTER — Encounter: Payer: Self-pay | Admitting: Family Medicine

## 2014-08-05 ENCOUNTER — Encounter: Payer: Self-pay | Admitting: Physical Therapy

## 2014-08-05 ENCOUNTER — Ambulatory Visit: Payer: Medicare Other | Admitting: Physical Therapy

## 2014-08-05 DIAGNOSIS — R279 Unspecified lack of coordination: Secondary | ICD-10-CM | POA: Diagnosis not present

## 2014-08-05 DIAGNOSIS — R531 Weakness: Secondary | ICD-10-CM | POA: Diagnosis not present

## 2014-08-05 DIAGNOSIS — IMO0002 Reserved for concepts with insufficient information to code with codable children: Secondary | ICD-10-CM

## 2014-08-05 DIAGNOSIS — I69898 Other sequelae of other cerebrovascular disease: Secondary | ICD-10-CM | POA: Diagnosis not present

## 2014-08-05 DIAGNOSIS — I6980 Unspecified sequelae of other cerebrovascular disease: Secondary | ICD-10-CM | POA: Diagnosis not present

## 2014-08-05 DIAGNOSIS — R269 Unspecified abnormalities of gait and mobility: Secondary | ICD-10-CM | POA: Diagnosis not present

## 2014-08-05 DIAGNOSIS — R609 Edema, unspecified: Secondary | ICD-10-CM | POA: Diagnosis not present

## 2014-08-05 NOTE — Therapy (Signed)
Physical Therapy Treatment  Patient Details  Name: James Bell MRN: [id number] Date of Birth: [birth year]  Encounter Date: 08/05/2014      PT End of Session - 08/05/14 0932    Visit Number 2  2/10   Number of Visits 17   Authorization Type G-code required every 10th visit for Medicare.   PT Start Time 0845   PT Stop Time 0931   PT Time Calculation (min) 46 min   Activity Tolerance Patient tolerated treatment well   Behavior During Therapy [facility] for tasks assessed/performed      Past Medical History  Diagnosis Date  . HLD (hyperlipidemia)   . Restless leg syndrome     well controlled on mirapex  . OSA on CPAP     sleep study 01/2014 - severe, CPAP at 10, previously on nuvigil (Dr. [name])  . Type 2 diabetes, controlled, with neuropathy 2011  . H/O hiatal hernia   . GERD (gastroesophageal reflux disease)     with sliding HH  . Migraines 03/11/12    now rare  . CVA (cerebral infarction) 03/07/12    slurred speech; right hemplegia, left handed - completed PT 07/2014 (17 sessions)  . Basal cell carcinoma of nose 2002    bridge of nose  . History of kidney problems 12/2010    lacerated left kidney after fall  . BPH with obstruction/lower urinary tract symptoms     failed flomax, uroxatral, myrbetriq - sees urology [name]) pending videourodynamics  . Depression     mild, on cymbalta per prior PCP  . Palpitations 01/2012    holter WNL, rare PAC/PVCs  . Anemia   . Erectile dysfunction     per urology (prostaglandin injection)  . Kidney cysts 02/2014    bilateral (renal US)  . CKD (chronic kidney disease) stage 3, GFR 30-59 ml/min 03/11/2012    Renal US 02/2014 - multiple kidney cysts   . TIA (transient ischemic attack)     Past Surgical History  Procedure Laterality Date  . Prostate surgery  02/2009    PVP (laser)  . Tonsillectomy and adenoidectomy  ~ 1945  . Hemorrhoid surgery  1991  . Cataract extraction w/ intraocular lens implant  11//2012 - 08/2011  .  Inguinal hernia repair  1981; 2002    left, then repaired  . Skin cancer excision  2002    bridge of nose, BCC  . Colonoscopy  05/2011    normal per pt (Dr. [name], [place], [name])  . Mri brain  04/2010    WNL  . Esophagogastroduodenoscopy  2006    duodenitis, medual HH, Grade 2 reflux, mild gastritis  . Colonoscopy  03/2011    poor prep, unable to biopsy polyp in tic fold, rpt 3 mo [name])  . Abi  2007    WNL  . Urethral dilation  2014    tannenbaum  . Cystoscopy with urethral dilatation N/A 07/24/2013    Procedure: CYSTOSCOPY WITH URETHRAL DILATATION;  Surgeon: [name], MD;  Location: [facility];  Service: Urology;  Laterality: N/A;  . Transurethral incision of prostate N/A 07/24/2013    Procedure: TRANSURETHRAL INCISION OF THE PROSTATE (TUIP);  Surgeon: [name], MD;  Location: [facility];  Service: Urology;  Laterality: N/A;  . Esophagogastroduodenoscopy  07/2011    mild chronic gastritis  . Colonoscopy  06/2011    mult polyps adenomatous, severe diverticulosis, rpt 3 yrs [name])  . Carotid endarterectomy    . Cardiac catheterization  2004    normal; [facility]  . Cardiovascular stress test  02/2014    WNL, low risk scan, EF 68%    There were no vitals taken for this visit.  Visit Diagnosis:  Abnormality of gait  Weakness due to cerebrovascular accident  Lack of coordination due to stroke      Subjective Assessment - 08/05/14 0847    Symptoms Fell in bathroom about 1 week ago.  Slipped on newly cleaned floor-no bathmat.  Denies any injury.   How long can you stand comfortably? 5 min   Patient Stated Goals To build strength in his legs and increase his ambulation distance without quad cane if possible.   Currently in Pain? No/denies            [facility] Adult PT Treatment/Exercise - 08/05/14 0851    Exercises   Exercises Knee/Hip   Knee/Hip Exercises: Aerobic   Stationary Bike SciFit level 2.5 x 8 min lower extremities only for strengthening    Knee/Hip Exercises: Standing   Heel Raises 1 set;10 reps  toe raises x 20   Heel Raises Limitations UE support with cues for technique   Other Standing Knee Exercises sit to/from stand x 10 without UE support   Other Standing Knee Exercises standing hip abdct and ext x 10 reps each with bil UE support and min cues for technique   Knee/Hip Exercises: Supine   Bridges Strengthening;Both;1 set;10 reps   Straight Leg Raises Right;Strengthening;1 set;10 reps   Straight Leg Raises Limitations cues to decrease extensor lag   Other Supine Knee Exercises single limb hooklying hip abdct/er x 10 bil with red theraband          PT Education - 08/05/14 0932    Education provided Yes   Education Details HEP   Person(s) Educated Patient   Methods Explanation;Demonstration;Verbal cues;Tactile cues;Handout   Comprehension Need further instruction;Verbalized understanding;Returned demonstration;Verbal cues required;Tactile cues required          PT Short Term Goals - 08/05/14 0934    PT SHORT TERM GOAL #1   Title Pt will be independent in HEP to improve strength and functional mobility. (08/25/14)   Time 4   Period Weeks   Status On-going   PT SHORT TERM GOAL #2   Title Pt will be improve BERG score to >/=40/56 to decrease falls risk. (08/25/14)   Time 4   Period Weeks   Status On-going   PT SHORT TERM GOAL #3   Title Pt will perform TUG, with LRAD, in </=13 seconds to decrease falls risk. (08/25/14)   Time 4   Period Weeks   Status On-going   PT SHORT TERM GOAL #4   Title Pt will improve gait speed to >/= 2.3ft/sec., with LRAD, to improve functional mobility. (08/25/14)   Time 4   Period Weeks   Status On-going   PT SHORT TERM GOAL #5   Title Pt will ambulate 400' with LRAD, over even/uneven terrain, with supervision to improve functional mobility. (08/25/14)   Time 4   Period Weeks   Status On-going          PT Long Term Goals - 08/05/14 0936    PT LONG TERM GOAL #1   Title Pt  will be able to verbalize understanding of falls prevention strategies to reduce risk of falls. (09/22/14)   Time 8   Period Weeks   Status On-going   PT LONG TERM GOAL #2   Title Pt will improve BERG  score to >/=44/56 to decrease falls risk. (09/22/14)   Time 8   Period Weeks   Status On-going   PT LONG TERM GOAL #3   Title Pt will improve gait speed, with LRAD, to >/=3.30ft/sec. to improve functional mobility. (09/22/14)   Time 8   Period Weeks   Status On-going   PT LONG TERM GOAL #4   Title Pt will be able to ambulate >/= 15 minutes without back pain. (09/22/14)   Time 8   Period Weeks   Status On-going   PT LONG TERM GOAL #5   Title Pt will be able to ambulate 600', with LRAD, over even/uneven terrain at MOD I level to improve functional mobility. (09/22/14)   Time 8   Period Weeks   Status On-going   PT LONG TERM GOAL #6   Title Pt will improve SIS-mobility score by 10% to improve quality of life. (09/22/14)   Time 8   Period Weeks   Status On-going          Plan - 08/05/14 0932    Clinical Impression Statement Initiated HEP to address lower extremity weakness and balance deficits.  Pt motivated to improve functional mobility and will benefit from PT to maximize functional mobility.   Pt will benefit from skilled therapeutic intervention in order to improve on the following deficits Abnormal gait;Decreased endurance;Decreased balance;Decreased mobility;Decreased strength;Decreased knowledge of use of DME;Decreased range of motion;Impaired flexibility;Impaired UE functional use   Rehab Potential Good   PT Frequency 2x / week   PT Duration 8 weeks   PT Treatment/Interventions ADLs/Self Care Home Management;DME Instruction;Balance training;Manual techniques;Therapeutic exercise;Therapeutic activities;Patient/family education;Functional mobility training;Stair training;Gait training;Neuromuscular re-education   PT Next Visit Plan review HEP; give pt hooklying hip abduction; gait  with/without quad cane   Consulted and Agree with Plan of Care Patient        Problem List Patient Active Problem List   Diagnosis Date Noted  . Spastic hemiparesis affecting dominant side 07/07/2014  . Right leg swelling 06/09/2014  . Left shoulder pain 03/16/2014  . Chest pressure 02/03/2014  . Spastic hemiplegia affecting dominant side 11/07/2013  . Medicare annual wellness visit, subsequent 11/06/2013  . ED (erectile dysfunction) 02/05/2013  . Depression   . Palpitations 06/26/2012  . HLD (hyperlipidemia)   . Restless leg syndrome   . GERD (gastroesophageal reflux disease)   . BPH with obstruction/lower urinary tract symptoms   . CVA (cerebrovascular accident) 03/11/2012  . Right hemiparesis 03/11/2012  . Type 2 diabetes, controlled, with neuropathy 03/11/2012  . OSA (obstructive sleep apnea) 03/11/2012  . CKD (chronic kidney disease) stage 3, GFR 30-59 ml/min 03/11/2012  . Migraines 03/11/2012                                         Laureen Abrahams, PT, DPT 08/05/2014 9:38 AM

## 2014-08-05 NOTE — Patient Instructions (Addendum)
Hip Backward Kick   Using a chair for balance, keep legs shoulder width apart and toes pointed for- ward. Slowly extend one leg back, keeping knee straight. Do not lean forward. Repeat with other leg. Repeat _10___ times. Do _1-2___ sessions per day.  http://gt2.exer.us/340   Copyright  VHI. All rights reserved.   Hip Side Kick   Holding a chair for balance, keep legs shoulder width apart and toes pointed forward. Swing a leg out to side, keeping knee straight. Do not lean. Repeat using other leg. Repeat ____ times. Do ____ sessions per day.  http://[email or web address].exer.us/342   Copyright  VHI. All rights reserved.   ANKLE: Plantarflexion, Bilateral - Standing   Stand with upright posture. Raise heels up as high as possible. _10__ reps per set, _1__ sets per day, _1-2__ times per day. Hold onto a support.  Copyright  VHI. All rights reserved.   Bridging   Slowly raise buttocks from floor, keeping stomach tight. Repeat _10___ times per set. Do _1___ sets per session. Do _1-2___ sessions per day.  http://orth.exer.us/1096   Straight Leg Raise   Tighten stomach and slowly raise locked right leg __6-8__ inches from floor. Repeat _10___ times per set. Do ___1_ sets per session. Do _1-2___ sessions per day.  http://orth.exer.us/1102   Copyright  VHI. All rights reserved.    Functional Quadriceps: Sit to Stand   Sit on edge of chair, feet flat on floor. Stand upright, extending knees fully. Repeat __10__ times per set. Do __1__ sets per session. Do _1-2___ sessions per day.  http://orth.exer.us/734   Copyright  VHI. All rights reserved.

## 2014-08-07 ENCOUNTER — Encounter: Payer: Self-pay | Admitting: Occupational Therapy

## 2014-08-07 ENCOUNTER — Encounter: Payer: Self-pay | Admitting: Physical Therapy

## 2014-08-07 ENCOUNTER — Ambulatory Visit: Payer: Medicare Other | Admitting: Physical Therapy

## 2014-08-07 ENCOUNTER — Ambulatory Visit: Payer: Medicare Other | Admitting: Occupational Therapy

## 2014-08-07 DIAGNOSIS — R269 Unspecified abnormalities of gait and mobility: Secondary | ICD-10-CM

## 2014-08-07 DIAGNOSIS — R609 Edema, unspecified: Secondary | ICD-10-CM

## 2014-08-07 DIAGNOSIS — IMO0002 Reserved for concepts with insufficient information to code with codable children: Secondary | ICD-10-CM

## 2014-08-07 DIAGNOSIS — I6980 Unspecified sequelae of other cerebrovascular disease: Secondary | ICD-10-CM | POA: Diagnosis not present

## 2014-08-07 DIAGNOSIS — I69898 Other sequelae of other cerebrovascular disease: Secondary | ICD-10-CM | POA: Diagnosis not present

## 2014-08-07 DIAGNOSIS — R531 Weakness: Secondary | ICD-10-CM | POA: Diagnosis not present

## 2014-08-07 DIAGNOSIS — R279 Unspecified lack of coordination: Secondary | ICD-10-CM

## 2014-08-07 DIAGNOSIS — R278 Other lack of coordination: Secondary | ICD-10-CM

## 2014-08-07 NOTE — Therapy (Signed)
Occupational Therapy Evaluation  Patient Details  Name: James Bell MRN: [id number] Date of Birth: 08-16-1931  Encounter Date: 08/07/2014  Treatment time: [id number]      OT End of Session - 08/07/14 1006    Visit Number 1  1/10 G   Number of Visits 17   Date for OT Re-Evaluation 09/04/14  Check STG's 09/04/14; LTG's 10/02/14   OT Start Time 0750   OT Stop Time 0840   OT Time Calculation (min) 50 min   Activity Tolerance Patient tolerated treatment well   Behavior During Therapy [facility] for tasks assessed/performed      Past Medical History  Diagnosis Date  . HLD (hyperlipidemia)   . Restless leg syndrome     well controlled on mirapex  . OSA on CPAP     sleep study 01/2014 - severe, CPAP at 10, previously on nuvigil (Dr. [name])  . Type 2 diabetes, controlled, with neuropathy 2011  . H/O hiatal hernia   . GERD (gastroesophageal reflux disease)     with sliding HH  . Migraines 03/11/12    now rare  . CVA (cerebral infarction) 03/07/12    slurred speech; right hemplegia, left handed - completed PT 07/2014 (17 sessions)  . Basal cell carcinoma of nose 2002    bridge of nose  . History of kidney problems 12/2010    lacerated left kidney after fall  . BPH with obstruction/lower urinary tract symptoms     failed flomax, uroxatral, myrbetriq - sees urology [name]) pending videourodynamics  . Depression     mild, on cymbalta per prior PCP  . Palpitations 01/2012    holter WNL, rare PAC/PVCs  . Erectile dysfunction     per urology (prostaglandin injection)  . Kidney cysts 02/2014    bilateral (renal US)  . CKD (chronic kidney disease) stage 3, GFR 30-59 ml/min 03/11/2012    Renal US 02/2014 - multiple kidney cysts   . TIA (transient ischemic attack)   . Anemia     Past Surgical History  Procedure Laterality Date  . Prostate surgery  02/2009    PVP (laser)  . Tonsillectomy and adenoidectomy  ~ 1945  . Hemorrhoid surgery  1991  . Cataract extraction w/  intraocular lens implant  11//2012 - 08/2011  . Inguinal hernia repair  1981; 2002    left, then repaired  . Skin cancer excision  2002    bridge of nose, BCC  . Colonoscopy  05/2011    normal per pt (Dr. [name], [place], [name])  . Mri brain  04/2010    WNL  . Esophagogastroduodenoscopy  2006    duodenitis, medual HH, Grade 2 reflux, mild gastritis  . Colonoscopy  03/2011    poor prep, unable to biopsy polyp in tic fold, rpt 3 mo [name])  . Abi  2007    WNL  . Urethral dilation  2014    tannenbaum  . Cystoscopy with urethral dilatation N/A 07/24/2013    Procedure: CYSTOSCOPY WITH URETHRAL DILATATION;  Surgeon: [name], MD;  Location: [facility];  Service: Urology;  Laterality: N/A;  . Transurethral incision of prostate N/A 07/24/2013    Procedure: TRANSURETHRAL INCISION OF THE PROSTATE (TUIP);  Surgeon: [name], MD;  Location: [facility];  Service: Urology;  Laterality: N/A;  . Esophagogastroduodenoscopy  07/2011    mild chronic gastritis  . Colonoscopy  06/2011    mult polyps adenomatous, severe diverticulosis, rpt 3 yrs [name])  . Carotid endarterectomy    .  Cardiac catheterization  2004    normal; [facility]  . Cardiovascular stress test  02/2014    WNL, low risk scan, EF 68%    There were no vitals taken for this visit.  Visit Diagnosis:  Weakness due to cerebrovascular accident - Plan: Ot plan of care cert/re-cert  Decreased coordination - Plan: Ot plan of care cert/re-cert  Edema - Plan: Ot plan of care cert/re-cert      Subjective Assessment - 08/07/14 0758    Symptoms Pt reports that CVA was June 2013, w/ R sided weakness and increased tone. Pt had botox injections x3, most recent about 3 weeks ago. He states that since most recent botox injection, his R handed grip has decreased.   Patient Stated Goals Pt "I would love to play the ukelle again" increase grip/grasp RUE.   Currently in Pain? No/denies   Multiple Pain Sites No          [facility] OT  Assessment - 08/07/14 0752    Home  Environment   Family/patient expects to be discharged to: Private residence   Living Arrangements Alone   Type of [place] One level   Alternate Level Stairs - Number of Steps --  1 small STE from garage   [place] -quad;Grab bars - toilet;Grab bars - tub/shower;Other (comment);Shower seat  Hemi walker, various canes   Lives With Alone   ADL   Eating/Feeding Other (comment )  Pt uses R hand as gross assist since he has had increased di   Eating/Feeding Patient Percentage --  difficulty making a fist since recent botox.   Where assessed - Eating/feeding --  Reports difficulty holding, stirring, lifting, carrying.   Grooming Other (comment)  Uses L hand for fine motor and utensils, grooming etc,   Where Assess - Grooming Other (comment)  L hand, R as assist   Upper Body Dressing Other (comment)  Uses L hand primarily, R as assist.   ADL comments --  Pt uses R hand as assist and L as dominant overall.   Sensation   Light Touch Appears Intact   Hot/Cold Appears Intact   Coordination   Gross Motor Movements are Fluid and Coordinated No   Fine Motor Movements are Fluid and Coordinated No   Box and Blocks --  L = 51 R = 10   Other --  JAMAR grip L=69#, R = unable   Coordination --  Able to oppose index, RF only of right hand   Edema   Edema --  Pt presents with min-moderate edema right hand noted   AROM   Overall AROM  Other (comment)  head, but not able to hold/sustain.   Overall AROM Comments R UE shoulder flex limited to approx. 110* degrees, pt can reach behind head and back but can not hold R UE in place for >2-3 seconds.    Pt was instructed in Home Program for AROM and A/AROM Right hand and performed all of the following in the clinic today:  1) Tendon gliding (full exten; AROM & blocked  hook fist; Composite fist; flat fist and place and hold). 2) Edema control techniques (elevation, retrograde massage and AROM ex's)  Pt was instructed to perform home program 2-3 times per day x5-10 reps each. He verbalized understanding and will benefit from review in clinic at next session  to ensure proper positioning and follow through.       OT Education - 08/07/14 1221    Education provided Yes   Education Details Pt was instructed in home program for AROM/A/AROM and edema control, see instructions above for details.   Person(s) Educated Patient   Methods Explanation;Demonstration;Tactile cues;Verbal cues;Handout   Comprehension Verbalized understanding;Need further instruction;Returned demonstration          OT Short Term Goals - 08/07/14 1232    OT SHORT TERM GOAL #1   Title Pt will be I home program   Baseline Req VC's and Min A    Time 4   Period Weeks   Status New   OT SHORT TERM GOAL #2   Title Pt will demonstrate increased ability to perform gross grasp R hand inpreparation for increased I ADL's/homemaking tasks   Baseline Currently using left UE and R as stabilizer/gross assist   Time 4   Period Weeks   Status New   OT SHORT TERM GOAL #3   Title Pt will demonstrate increased AROM R hand as evidenced by ability to perform flat fist in preparation for increased functional use RUE   Baseline Unable   Time 4   Period Weeks   Status New   OT SHORT TERM GOAL #4   Title Pt will independently state edema control techniques   Baseline VC'a   Time 4   Period Weeks   Status New          OT Long Term Goals - 08/07/14 1225    OT LONG TERM GOAL #1   Title Pt will demonstrate increased independence with ADLhomemaking as evidenced by his ability to use R & LUE's to remove small dish from oven   Baseline Initially using forearm as groos assist/to balance objects and LUE as dominant hand   Time 8   Period Weeks   Status New   OT LONG TERM GOAL #2   Title Pt will be  I with upgraded HEP RUE   Time 8   Period Weeks   Status New   OT LONG TERM GOAL #3   Title Pt will demonstrate increased ability to perform grasp release as seen by improvement RUE Box and Blocks score by 10 or more blocks   Baseline Initial assessment RUE = 10 blocks   Time 8   Period Weeks   Status New   OT LONG TERM GOAL #4   Title Pt will reports increased functional use RUE as functional assist for ADL's, homemaking tasks 80% of the time or greater.   Baseline Pt currently uses LUE as dominant UE   Time 8   Period Weeks   Status New          Plan - 08/07/14 1009    Clinical Impression Statement Pt is an 80 y/o male w/ h/o CVA resulting in decreased functional use R UE, decreased coordination, decreased grip etc. Pt reports PLOF as Mod I, however, he states that he has had increased difficulty with RUE rapid movements, fine motor control & coordination  s/p recent botox to decrease RUE spasticity but, now with difficulty w/ hand grasp and grip strength affecting ADL's and homemaking tasks. He should benefit from out-pt OT address deficits and provide pt education and home program for increased functional use RUE.   Rehab Potential Good   OT Frequency 2x / week   OT Duration 8 weeks   OT Treatment/Interventions Self-care/ADL training;Therapeutic exercise;Moist Heat;Fluidtherapy;DME and/or AE instruction;Neuromuscular  education;Manual Therapy;Therapeutic exercises;Therapeutic activities;Patient/family education   Plan Re-assess Home program, CVA education    Consulted and Agree with Plan of Care Patient          G-Codes - 08-14-2014 1233    Functional Assessment Tool Used Box and blocks and clinical judgement   Functional Limitation Carrying, moving and handling objects   Carrying, Moving and Handling Objects Current Status [phone]) At least 60 percent but less than 80 percent impaired, limited or restricted   Carrying, Moving and Handling Objects Goal Status ([id number]) At least 20  percent but less than 40 percent impaired, limited or restricted      Problem List Patient Active Problem List   Diagnosis Date Noted  . Spastic hemiparesis affecting dominant side 07/07/2014  . Right leg swelling 06/09/2014  . Left shoulder pain 03/16/2014  . Chest pressure 02/03/2014  . Spastic hemiplegia affecting dominant side 11/07/2013  . Medicare annual wellness visit, subsequent 11/06/2013  . ED (erectile dysfunction) 02/05/2013  . Depression   . Palpitations 06/26/2012  . HLD (hyperlipidemia)   . Restless leg syndrome   . GERD (gastroesophageal reflux disease)   . BPH with obstruction/lower urinary tract symptoms   . CVA (cerebrovascular accident) 03/11/2012  . Right hemiparesis 03/11/2012  . Type 2 diabetes, controlled, with neuropathy 03/11/2012  . OSA (obstructive sleep apnea) 03/11/2012  . CKD (chronic kidney disease) stage 3, GFR 30-59 ml/min 03/11/2012  . Migraines 03/11/2012                                               Libby Goehring Beth Dixon, OTR/L 08/14/2014, 12:58 PM

## 2014-08-07 NOTE — Therapy (Signed)
Physical Therapy Treatment  Patient Details  Name: LAURIER JASPERSON MRN: [id number] Date of Birth: [birth year]  Encounter Date: 08/07/2014      PT End of Session - 08/07/14 0928    Visit Number 3   Number of Visits 17   Authorization Type G-code required every 10th visit for Medicare.   PT Start Time 0845   PT Stop Time 0927   PT Time Calculation (min) 42 min   Activity Tolerance Patient tolerated treatment well   Behavior During Therapy [facility] for tasks assessed/performed      Past Medical History  Diagnosis Date  . HLD (hyperlipidemia)   . Restless leg syndrome     well controlled on mirapex  . OSA on CPAP     sleep study 01/2014 - severe, CPAP at 10, previously on nuvigil (Dr. [name])  . Type 2 diabetes, controlled, with neuropathy 2011  . H/O hiatal hernia   . GERD (gastroesophageal reflux disease)     with sliding HH  . Migraines 03/11/12    now rare  . CVA (cerebral infarction) 03/07/12    slurred speech; right hemplegia, left handed - completed PT 07/2014 (17 sessions)  . Basal cell carcinoma of nose 2002    bridge of nose  . History of kidney problems 12/2010    lacerated left kidney after fall  . BPH with obstruction/lower urinary tract symptoms     failed flomax, uroxatral, myrbetriq - sees urology [name]) pending videourodynamics  . Depression     mild, on cymbalta per prior PCP  . Palpitations 01/2012    holter WNL, rare PAC/PVCs  . Erectile dysfunction     per urology (prostaglandin injection)  . Kidney cysts 02/2014    bilateral (renal US)  . CKD (chronic kidney disease) stage 3, GFR 30-59 ml/min 03/11/2012    Renal US 02/2014 - multiple kidney cysts   . TIA (transient ischemic attack)   . Anemia     Past Surgical History  Procedure Laterality Date  . Prostate surgery  02/2009    PVP (laser)  . Tonsillectomy and adenoidectomy  ~ 1945  . Hemorrhoid surgery  1991  . Cataract extraction w/ intraocular lens implant  11//2012 - 08/2011  .  Inguinal hernia repair  1981; 2002    left, then repaired  . Skin cancer excision  2002    bridge of nose, BCC  . Colonoscopy  05/2011    normal per pt (Dr. [name], [place], [name])  . Mri brain  04/2010    WNL  . Esophagogastroduodenoscopy  2006    duodenitis, medual HH, Grade 2 reflux, mild gastritis  . Colonoscopy  03/2011    poor prep, unable to biopsy polyp in tic fold, rpt 3 mo [name])  . Abi  2007    WNL  . Urethral dilation  2014    tannenbaum  . Cystoscopy with urethral dilatation N/A 07/24/2013    Procedure: CYSTOSCOPY WITH URETHRAL DILATATION;  Surgeon: [name], MD;  Location: [facility];  Service: Urology;  Laterality: N/A;  . Transurethral incision of prostate N/A 07/24/2013    Procedure: TRANSURETHRAL INCISION OF THE PROSTATE (TUIP);  Surgeon: [name], MD;  Location: [facility];  Service: Urology;  Laterality: N/A;  . Esophagogastroduodenoscopy  07/2011    mild chronic gastritis  . Colonoscopy  06/2011    mult polyps adenomatous, severe diverticulosis, rpt 3 yrs [name])  . Carotid endarterectomy    . Cardiac catheterization  2004  normal; [facility]  . Cardiovascular stress test  02/2014    WNL, low risk scan, EF 68%    There were no vitals taken for this visit.  Visit Diagnosis:  Abnormality of gait  Weakness due to cerebrovascular accident  Lack of coordination due to stroke      Subjective Assessment - 08/07/14 0848    Symptoms No new c/o.  A little sore after last session.   Limitations Standing   How long can you stand comfortably? 5 min   Patient Stated Goals To build strength in his legs and increase his ambulation distance without quad cane if possible.   Currently in Pain? No/denies            [facility] Adult PT Treatment/Exercise - 08/07/14 0855    Ambulation/Gait   Ambulation/Gait Yes   Ambulation/Gait Assistance 4: Min assist   Ambulation/Gait Assistance Details LOB x 1 due to decreased R foot clearance; min A needed to  correct LOB; trialed R toe off and ottobock AFOs.  Both improved foot clearance however pt with increased R knee recurvatum with R toe off and 1/4' heel wedge.  R ottobock improved foot clearance and decreased recurvatum.   Ambulation Distance (Feet) 200 Feet   Assistive device Small based quad cane;Other (Comment)  trialed toe off and ottobock PLS on RLE   Gait Pattern Step-through pattern;Decreased step length - left;Decreased stance time - right;Decreased dorsiflexion - right;Decreased dorsiflexion - left;Scissoring;Narrow base of support;Trunk flexed;Trendelenburg  decreased hip rotation   Exercises   Exercises Knee/Hip   Knee/Hip Exercises: Stretches   Passive Hamstring Stretch 30 seconds;3 reps  bil with strap   Knee/Hip Exercises: Standing   Heel Raises 1 set;20 reps   Heel Raises Limitations UE support with cues for technique   Other Standing Knee Exercises standing hip abdct and ext x 10 reps each with bil UE support and min cues for technique   Knee/Hip Exercises: Supine   Bridges Strengthening;Both;1 set;10 reps   Straight Leg Raises Both;10 reps;Strengthening          PT Education - 08/07/14 1117    Education provided Yes   Education Details AFO options and possibly obtaining new AFO   Person(s) Educated Patient   Methods Explanation;Demonstration;Verbal cues;Tactile cues   Comprehension Verbalized understanding;Need further instruction;Returned demonstration          PT Short Term Goals - 08/07/14 1120    PT SHORT TERM GOAL #1   Title Pt will be independent in HEP to improve strength and functional mobility. (08/25/14)   Time 4   Period Weeks   Status On-going   PT SHORT TERM GOAL #2   Title Pt will be improve BERG score to >/=40/56 to decrease falls risk. (08/25/14)   Time 4   Period Weeks   Status On-going   PT SHORT TERM GOAL #3   Title Pt will perform TUG, with LRAD, in </=13 seconds to decrease falls risk. (08/25/14)   Period Weeks   Status On-going    PT SHORT TERM GOAL #4   Title Pt will improve gait speed to >/= 2.21ft/sec., with LRAD, to improve functional mobility. (08/25/14)   Time 4   Period Weeks   Status On-going   PT SHORT TERM GOAL #5   Title Pt will ambulate 400' with LRAD, over even/uneven terrain, with supervision to improve functional mobility. (08/25/14)   Time 4   Period Weeks   Status On-going  PT Long Term Goals - 08/07/14 1121    PT LONG TERM GOAL #1   Title Pt will be able to verbalize understanding of falls prevention strategies to reduce risk of falls. (09/22/14)   Time 8   Period Weeks   Status On-going   PT LONG TERM GOAL #2   Title Pt will improve BERG score to >/=44/56 to decrease falls risk. (09/22/14)   Time 8   Period Weeks   Status On-going   PT LONG TERM GOAL #3   Title Pt will improve gait speed, with LRAD, to >/=3.47ft/sec. to improve functional mobility. (09/22/14)   Time 8   Period Weeks   Status On-going   PT LONG TERM GOAL #4   Title Pt will be able to ambulate >/= 15 minutes without back pain. (09/22/14)   Time 8   Period Weeks   Status On-going   PT LONG TERM GOAL #5   Title Pt will be able to ambulate 600', with LRAD, over even/uneven terrain at MOD I level to improve functional mobility. (09/22/14)   Time 8   Period Weeks   Status On-going   Additional Long Term Goals   Additional Long Term Goals Yes   PT LONG TERM GOAL #6   Title Pt will improve SIS-mobility score by 10% to improve quality of life. (09/22/14)   Time 8   Period Weeks   Status On-going          Plan - 08/07/14 1118    Clinical Impression Statement Trialed R toe off AFO with 1/4" heel wedge and R ottobock AFO.  Both AFOs improved foot clearance however toe off increased R knee recurvatum and ottobock decreased recurvatum without buckling.  Feel pt may benefit from R PLS AFO (with heel wedge to be assessed)  to improve foot clearance and decrease recurvatum and risk for falls.   Pt will benefit from skilled  therapeutic intervention in order to improve on the following deficits Abnormal gait;Decreased endurance;Decreased balance;Decreased mobility;Decreased strength;Decreased knowledge of use of DME;Decreased range of motion;Impaired flexibility;Impaired UE functional use   Rehab Potential Good   PT Frequency 2x / week   PT Duration 8 weeks   PT Treatment/Interventions ADLs/Self Care Home Management;DME Instruction;Balance training;Manual techniques;Therapeutic exercise;Therapeutic activities;Patient/family education;Functional mobility training;Stair training;Gait training;Neuromuscular re-education   PT Next Visit Plan cont gait with AFO (pt to bring his AFO); strengthening and core stability   Consulted and Agree with Plan of Care Patient        Problem List Patient Active Problem List   Diagnosis Date Noted  . Spastic hemiparesis affecting dominant side 07/07/2014  . Right leg swelling 06/09/2014  . Left shoulder pain 03/16/2014  . Chest pressure 02/03/2014  . Spastic hemiplegia affecting dominant side 11/07/2013  . Medicare annual wellness visit, subsequent 11/06/2013  . ED (erectile dysfunction) 02/05/2013  . Depression   . Palpitations 06/26/2012  . HLD (hyperlipidemia)   . Restless leg syndrome   . GERD (gastroesophageal reflux disease)   . BPH with obstruction/lower urinary tract symptoms   . CVA (cerebrovascular accident) 03/11/2012  . Right hemiparesis 03/11/2012  . Type 2 diabetes, controlled, with neuropathy 03/11/2012  . OSA (obstructive sleep apnea) 03/11/2012  . CKD (chronic kidney disease) stage 3, GFR 30-59 ml/min 03/11/2012  . Migraines 03/11/2012  Laureen Abrahams, PT, DPT 08/07/2014 11:23 AM

## 2014-08-07 NOTE — Patient Instructions (Signed)
Pt was instructed in Home Program for AROM and A/AROM Right hand and performed all of the following in the clinic today:  1) Tendon gliding (full exten; AROM & blocked hook fist; Composite fist; flat fist and place and hold). 2) Edema control techniques (elevation, retrograde massage and AROM ex's)  Pt was instructed to perform home program 2-3 times per day x5-10 reps each. He verbalized understanding and will benefit from review in clinic at next session to ensure proper positioning and follow through.

## 2014-08-10 ENCOUNTER — Encounter: Payer: Self-pay | Admitting: Physical Therapy

## 2014-08-10 ENCOUNTER — Ambulatory Visit: Payer: Medicare Other | Admitting: Physical Therapy

## 2014-08-10 ENCOUNTER — Ambulatory Visit: Payer: Medicare Other | Admitting: Occupational Therapy

## 2014-08-10 ENCOUNTER — Encounter: Payer: Self-pay | Admitting: Occupational Therapy

## 2014-08-10 DIAGNOSIS — R279 Unspecified lack of coordination: Secondary | ICD-10-CM

## 2014-08-10 DIAGNOSIS — R269 Unspecified abnormalities of gait and mobility: Secondary | ICD-10-CM

## 2014-08-10 DIAGNOSIS — R609 Edema, unspecified: Secondary | ICD-10-CM

## 2014-08-10 DIAGNOSIS — I6980 Unspecified sequelae of other cerebrovascular disease: Secondary | ICD-10-CM | POA: Diagnosis not present

## 2014-08-10 DIAGNOSIS — IMO0002 Reserved for concepts with insufficient information to code with codable children: Secondary | ICD-10-CM

## 2014-08-10 DIAGNOSIS — R278 Other lack of coordination: Secondary | ICD-10-CM

## 2014-08-10 DIAGNOSIS — I69898 Other sequelae of other cerebrovascular disease: Secondary | ICD-10-CM | POA: Diagnosis not present

## 2014-08-10 DIAGNOSIS — R531 Weakness: Secondary | ICD-10-CM | POA: Diagnosis not present

## 2014-08-10 NOTE — Therapy (Signed)
Occupational Therapy Treatment  Patient Details  Name: James Bell MRN: [id number] Date of Birth: [birth year]  Encounter Date: 08/10/2014      OT End of Session - 08/10/14 1025    Visit Number 2  2/10G   Number of Visits 17   Date for OT Re-Evaluation 10/02/14  STG's 09/04/14   OT Start Time 0930   OT Stop Time 1015   OT Time Calculation (min) 45 min   Activity Tolerance Patient tolerated treatment well      Past Medical History  Diagnosis Date  . HLD (hyperlipidemia)   . Restless leg syndrome     well controlled on mirapex  . OSA on CPAP     sleep study 01/2014 - severe, CPAP at 10, previously on nuvigil (Dr. [name])  . Type 2 diabetes, controlled, with neuropathy 2011  . H/O hiatal hernia   . GERD (gastroesophageal reflux disease)     with sliding HH  . Migraines 03/11/12    now rare  . CVA (cerebral infarction) 03/07/12    slurred speech; right hemplegia, left handed - completed PT 07/2014 (17 sessions)  . Basal cell carcinoma of nose 2002    bridge of nose  . History of kidney problems 12/2010    lacerated left kidney after fall  . BPH with obstruction/lower urinary tract symptoms     failed flomax, uroxatral, myrbetriq - sees urology [name]) pending videourodynamics  . Depression     mild, on cymbalta per prior PCP  . Palpitations 01/2012    holter WNL, rare PAC/PVCs  . Erectile dysfunction     per urology (prostaglandin injection)  . Kidney cysts 02/2014    bilateral (renal US)  . CKD (chronic kidney disease) stage 3, GFR 30-59 ml/min 03/11/2012    Renal US 02/2014 - multiple kidney cysts   . TIA (transient ischemic attack)   . Anemia     Past Surgical History  Procedure Laterality Date  . Prostate surgery  02/2009    PVP (laser)  . Tonsillectomy and adenoidectomy  ~ 1945  . Hemorrhoid surgery  1991  . Cataract extraction w/ intraocular lens implant  11//2012 - 08/2011  . Inguinal hernia repair  1981; 2002    left, then repaired  . Skin  cancer excision  2002    bridge of nose, BCC  . Colonoscopy  05/2011    normal per pt (Dr. [name], [place], [name])  . Mri brain  04/2010    WNL  . Esophagogastroduodenoscopy  2006    duodenitis, medual HH, Grade 2 reflux, mild gastritis  . Colonoscopy  03/2011    poor prep, unable to biopsy polyp in tic fold, rpt 3 mo [name])  . Abi  2007    WNL  . Urethral dilation  2014    tannenbaum  . Cystoscopy with urethral dilatation N/A 07/24/2013    Procedure: CYSTOSCOPY WITH URETHRAL DILATATION;  Surgeon: [name], MD;  Location: [facility];  Service: Urology;  Laterality: N/A;  . Transurethral incision of prostate N/A 07/24/2013    Procedure: TRANSURETHRAL INCISION OF THE PROSTATE (TUIP);  Surgeon: [name], MD;  Location: [facility];  Service: Urology;  Laterality: N/A;  . Esophagogastroduodenoscopy  07/2011    mild chronic gastritis  . Colonoscopy  06/2011    mult polyps adenomatous, severe diverticulosis, rpt 3 yrs [name])  . Carotid endarterectomy    . Cardiac catheterization  2004    normal; [facility]  . Cardiovascular  stress test  02/2014    WNL, low risk scan, EF 68%    There were no vitals taken for this visit.  Visit Diagnosis:  Weakness due to cerebrovascular accident  Edema  Lack of coordination due to stroke  Decreased coordination      Subjective Assessment - 08/10/14 0942    Symptoms I just have motor difficulties, sensation is intact. (recent botox - approx 4 wks ago for thumb/finger flexors)   Currently in Pain? No/denies            OT Treatments/Exercises (OP) - 08/10/14 1012    ADLs   ADL Comments Pt/therapist discussed safety w/ getting items in/out of oven. Recommended pt. use smaller baking dish and only getting out with Lt (uninvolved) hand. If pt as to use Rt hand too, then recommended extra long silicone based oven mitt.    ADL Education Given Yes  CVA education reviewed and issued   Exercises   Exercises Hand   Hand Exercises    Theraputty Grip  x 10 reps w/ yellow putty   Theraputty - Pinch Roll and pinch x 10 reps each. Then performed finger extension ex's w/ rubber band x 10 reps each.    Neurological Re-education Exercises   Other Exercises 1 Reviewed tendon gliding ex's previously issued x 10 reps, then followed by PROM in same position holding x 10 sec. each   Other Exercises 2 UBE x10 min. Level 1 for reciprocal mvmt pattern          OT Education - 08/10/14 1010    Education provided Yes   Education Details Pt was instructed in yellow putty exercises (yellow putty issued), CVA education, and reviewed tendon gliding ex's previously issued  Therapist also discussed strategies and/or modifications for safety w/ cooking and getting items out of oven including using hot mitt, smaller baking dish, etc   Person(s) Educated Patient   Methods Explanation;Demonstration;Handout   Comprehension Verbalized understanding;Returned demonstration          OT Short Term Goals - 08/10/14 1027    OT SHORT TERM GOAL #1   Title Pt will be I home program   Status Achieved            Plan - 08/10/14 1027    Clinical Impression Statement Pt/therapist reviewed tendon gliding ex's and reports increased understanding. Pt tolerating putty ex's well Rt hand        Problem List Patient Active Problem List   Diagnosis Date Noted  . Spastic hemiparesis affecting dominant side 07/07/2014  . Right leg swelling 06/09/2014  . Left shoulder pain 03/16/2014  . Chest pressure 02/03/2014  . Spastic hemiplegia affecting dominant side 11/07/2013  . Medicare annual wellness visit, subsequent 11/06/2013  . ED (erectile dysfunction) 02/05/2013  . Depression   . Palpitations 06/26/2012  . HLD (hyperlipidemia)   . Restless leg syndrome   . GERD (gastroesophageal reflux disease)   . BPH with obstruction/lower urinary tract symptoms   . CVA (cerebrovascular accident) 03/11/2012  . Right hemiparesis 03/11/2012  . Type 2  diabetes, controlled, with neuropathy 03/11/2012  . OSA (obstructive sleep apnea) 03/11/2012  . CKD (chronic kidney disease) stage 3, GFR 30-59 ml/min 03/11/2012  . Migraines 03/11/2012                                           Redmond Baseman, OTR/L [phone]  [facility] [address] [place], [place] [address] Phone: [phone] Fax: [phone]     Carey Bullocks 08/10/2014, 10:30 AM

## 2014-08-10 NOTE — Patient Instructions (Signed)
1. Grip Strengthening (Resistive Putty)   Squeeze putty using thumb and all fingers. Repeat _20___ times. Do __2__ sessions per day.   2. Roll putty into tube on table and pinch between each finger and thumb x 10 reps each. (can do ring and small finger together)     .   IP Extension (Resistive Putty)   Place putty loop around tips of fingers and thumb. Stretch loop by extending middle and tip joints. Keep thumb and large knuckles still. Repeat __10__ times. Do _2___ sessions per day.  Copyright  VHI. All rights reserved.

## 2014-08-10 NOTE — Therapy (Signed)
Physical Therapy Treatment  Patient Details  Name: GRANT SWAGER MRN: [id number] Date of Birth: 10-27-1930  Encounter Date: 08/10/2014      PT End of Session - 08/10/14 1037    Visit Number 4   Number of Visits 17   Authorization Type G-code required every 10th visit for Medicare.   PT Start Time 0845   PT Stop Time 0927   PT Time Calculation (min) 42 min   Equipment Utilized During Treatment Gait belt   Activity Tolerance Patient tolerated treatment well;Patient limited by fatigue  frequent rest breaks needed   Behavior During Therapy [facility] for tasks assessed/performed      Past Medical History  Diagnosis Date  . HLD (hyperlipidemia)   . Restless leg syndrome     well controlled on mirapex  . OSA on CPAP     sleep study 01/2014 - severe, CPAP at 10, previously on nuvigil (Dr. [name])  . Type 2 diabetes, controlled, with neuropathy 2011  . H/O hiatal hernia   . GERD (gastroesophageal reflux disease)     with sliding HH  . Migraines 03/11/12    now rare  . CVA (cerebral infarction) 03/07/12    slurred speech; right hemplegia, left handed - completed PT 07/2014 (17 sessions)  . Basal cell carcinoma of nose 2002    bridge of nose  . History of kidney problems 12/2010    lacerated left kidney after fall  . BPH with obstruction/lower urinary tract symptoms     failed flomax, uroxatral, myrbetriq - sees urology [name]) pending videourodynamics  . Depression     mild, on cymbalta per prior PCP  . Palpitations 01/2012    holter WNL, rare PAC/PVCs  . Erectile dysfunction     per urology (prostaglandin injection)  . Kidney cysts 02/2014    bilateral (renal US)  . CKD (chronic kidney disease) stage 3, GFR 30-59 ml/min 03/11/2012    Renal US 02/2014 - multiple kidney cysts   . TIA (transient ischemic attack)   . Anemia     Past Surgical History  Procedure Laterality Date  . Prostate surgery  02/2009    PVP (laser)  . Tonsillectomy and adenoidectomy  ~ 1945  .  Hemorrhoid surgery  1991  . Cataract extraction w/ intraocular lens implant  11//2012 - 08/2011  . Inguinal hernia repair  1981; 2002    left, then repaired  . Skin cancer excision  2002    bridge of nose, BCC  . Colonoscopy  05/2011    normal per pt (Dr. [name], [place], [name])  . Mri brain  04/2010    WNL  . Esophagogastroduodenoscopy  2006    duodenitis, medual HH, Grade 2 reflux, mild gastritis  . Colonoscopy  03/2011    poor prep, unable to biopsy polyp in tic fold, rpt 3 mo [name])  . Abi  2007    WNL  . Urethral dilation  2014    tannenbaum  . Cystoscopy with urethral dilatation N/A 07/24/2013    Procedure: CYSTOSCOPY WITH URETHRAL DILATATION;  Surgeon: [name], MD;  Location: [facility];  Service: Urology;  Laterality: N/A;  . Transurethral incision of prostate N/A 07/24/2013    Procedure: TRANSURETHRAL INCISION OF THE PROSTATE (TUIP);  Surgeon: [name], MD;  Location: [facility];  Service: Urology;  Laterality: N/A;  . Esophagogastroduodenoscopy  07/2011    mild chronic gastritis  . Colonoscopy  06/2011    mult polyps adenomatous, severe diverticulosis, rpt  3 yrs [name])  . Carotid endarterectomy    . Cardiac catheterization  2004    normal; [facility]  . Cardiovascular stress test  02/2014    WNL, low risk scan, EF 68%    There were no vitals taken for this visit.  Visit Diagnosis:  Weakness due to cerebrovascular accident  Decreased coordination  Edema  Abnormality of gait  Lack of coordination due to stroke      Subjective Assessment - 08/10/14 0847    Symptoms Still a little sore; no falls. Forgot AFO today.   Limitations Standing   How long can you stand comfortably? 5 min   Patient Stated Goals To build strength in his legs and increase his ambulation distance without quad cane if possible.            [facility] PT Treatment/Exercise - 08/10/14 1032    Ambulation/Gait   Ambulation/Gait Yes   Ambulation/Gait Assistance 5:  Supervision   Ambulation/Gait Assistance Details Gait with R ottobock and 1/4" heel wedge with decreased recurvatum and improved foot clearance.  Added visual cues to decrease scissoring gait with min VCs needed for attention to task and maintaining appropriate step width.   Ambulation Distance (Feet) 550 Feet   Assistive device Small based quad cane;Other (Comment)  R ottobock   Gait Pattern Step-through pattern;Decreased step length - left;Decreased stance time - right;Decreased dorsiflexion - right;Decreased dorsiflexion - left;Scissoring;Narrow base of support;Trunk flexed;Trendelenburg  improved foot clearance with decreased recurvatum          PT Education - 08/10/14 1037    Education provided No          PT Short Term Goals - 08/10/14 1039    PT SHORT TERM GOAL #1   Title Pt will be independent in HEP to improve strength and functional mobility. (08/25/14)   Time 4   Period Weeks   Status On-going   PT SHORT TERM GOAL #2   Title Pt will be improve BERG score to >/=40/56 to decrease falls risk. (08/25/14)   Period Weeks   Status On-going   PT SHORT TERM GOAL #3   Title Pt will perform TUG, with LRAD, in </=13 seconds to decrease falls risk. (08/25/14)   Time 4   Period Weeks   Status On-going   PT SHORT TERM GOAL #4   Title Pt will improve gait speed to >/= 2.53ft/sec., with LRAD, to improve functional mobility. (08/25/14)   Time 4   Period Weeks   Status On-going   PT SHORT TERM GOAL #5   Title Pt will ambulate 400' with LRAD, over even/uneven terrain, with supervision to improve functional mobility. (08/25/14)   Time 4   Period Weeks   Status On-going          PT Long Term Goals - 08/10/14 1040    PT LONG TERM GOAL #1   Title Pt will be able to verbalize understanding of falls prevention strategies to reduce risk of falls. (09/22/14)   Time 8   Period Weeks   Status On-going   PT LONG TERM GOAL #2   Title Pt will improve BERG score to >/=44/56 to decrease  falls risk. (09/22/14)   Time 8   Period Weeks   Status On-going   PT LONG TERM GOAL #3   Title Pt will improve gait speed, with LRAD, to >/=3.82ft/sec. to improve functional mobility. (09/22/14)   Time 8   Period Weeks   Status On-going   PT LONG  TERM GOAL #4   Title Pt will be able to ambulate >/= 15 minutes without back pain. (09/22/14)   Time 8   Period Weeks   Status On-going   PT LONG TERM GOAL #5   Title Pt will be able to ambulate 600', with LRAD, over even/uneven terrain at MOD I level to improve functional mobility. (09/22/14)   Time 8   Period Weeks   Status On-going   PT LONG TERM GOAL #6   Title Pt will improve SIS-mobility score by 10% to improve quality of life. (09/22/14)   Time 8   Period Weeks   Status On-going          Plan - 08/10/14 1038    Clinical Impression Statement Pt demonstrates improved foot clearance and decreased recurvatum with R ottobock and 1/4" heel wedge.  Pt reports increased stability with gait.  Will continue to benefit from PT to maximize functional mobility and decrease fall risk.   Pt will benefit from skilled therapeutic intervention in order to improve on the following deficits Abnormal gait;Decreased endurance;Decreased balance;Decreased mobility;Decreased strength;Decreased knowledge of use of DME;Decreased range of motion;Impaired flexibility;Impaired UE functional use   PT Frequency 2x / week   PT Duration 8 weeks   PT Treatment/Interventions ADLs/Self Care Home Management;DME Instruction;Balance training;Manual techniques;Therapeutic exercise;Therapeutic activities;Patient/family education;Functional mobility training;Stair training;Gait training;Neuromuscular re-education   PT Next Visit Plan cont gait with AFO (pt to bring his AFO); strengthening and core stability        Problem List Patient Active Problem List   Diagnosis Date Noted  . Spastic hemiparesis affecting dominant side 07/07/2014  . Right leg swelling 06/09/2014  .  Left shoulder pain 03/16/2014  . Chest pressure 02/03/2014  . Spastic hemiplegia affecting dominant side 11/07/2013  . Medicare annual wellness visit, subsequent 11/06/2013  . ED (erectile dysfunction) 02/05/2013  . Depression   . Palpitations 06/26/2012  . HLD (hyperlipidemia)   . Restless leg syndrome   . GERD (gastroesophageal reflux disease)   . BPH with obstruction/lower urinary tract symptoms   . CVA (cerebrovascular accident) 03/11/2012  . Right hemiparesis 03/11/2012  . Type 2 diabetes, controlled, with neuropathy 03/11/2012  . OSA (obstructive sleep apnea) 03/11/2012  . CKD (chronic kidney disease) stage 3, GFR 30-59 ml/min 03/11/2012  . Migraines 03/11/2012                                  Balance Exercises - 08/10/14 1034    Balance Exercises: Standing   SLS Upper extremity support 1;Other reps (comment);Modified  x 20 reps; with RUE support taps to 4 in block   Standing, One Foot on a Step Trunk rotation;Eyes open;4 inch;Other reps (comment);Limitations  10 reps without UE support; LLE on step; shoulder flex x 10                  Laureen Abrahams, PT, DPT 08/10/2014 10:41 AM  [place] [phone] (office) [phone] (fax)

## 2014-08-11 ENCOUNTER — Ambulatory Visit (HOSPITAL_BASED_OUTPATIENT_CLINIC_OR_DEPARTMENT_OTHER): Payer: Medicare Other | Admitting: Physical Medicine & Rehabilitation

## 2014-08-11 ENCOUNTER — Encounter: Payer: Medicare Other | Attending: Physical Medicine & Rehabilitation

## 2014-08-11 ENCOUNTER — Encounter: Payer: Self-pay | Admitting: Physical Medicine & Rehabilitation

## 2014-08-11 VITALS — BP 144/62 | HR 78 | Resp 14 | Ht 68.0 in

## 2014-08-11 DIAGNOSIS — M7989 Other specified soft tissue disorders: Secondary | ICD-10-CM

## 2014-08-11 DIAGNOSIS — G8110 Spastic hemiplegia affecting unspecified side: Secondary | ICD-10-CM | POA: Insufficient documentation

## 2014-08-11 DIAGNOSIS — I639 Cerebral infarction, unspecified: Secondary | ICD-10-CM

## 2014-08-11 NOTE — Progress Notes (Signed)
Subjective:    Patient ID: James Bell, male    DOB: [birth year], 81 y.o.   MRN: [id number]  HPI  03/2014 botox Brachioradialis50U FCR50U FCU25 FDS75 WNI62 Adductor pollicis 25  70/3500 botox Brachioradialis50U FCR50U FCU25 FDS50 FDP50  Now has full extension Pain Inventory Average Pain 0 Pain Right Now 0 My pain is No Pain  In the last 24 hours, has pain interfered with the following? General activity 0 Relation with others 0 Enjoyment of life 0 What TIME of day is your pain at its worst? No Pain Sleep (in general) Fair  Pain is worse with: . Pain improves with: No Pain Relief from Meds: 0  Mobility use a cane ability to climb steps?  yes do you drive?  yes  Function retired  Neuro/Psych No problems in this area  Prior Studies Any changes since last visit?  no  Physicians involved in your care Any changes since last visit?  no   Family History  Problem Relation Age of Onset  . Stroke Father   . Lung cancer Father 47     smoker  . Stroke Brother   . Diabetes Brother 28  . Coronary artery disease Neg Hx   . Lung cancer Brother   . Alcoholism Sister   . Kidney disease Sister    History   Social History  . Marital Status: Unknown    Spouse Name: N/A    Number of Children: 2  . Years of Education: MD   Occupational History  . retired   .     Social History Main Topics  . Smoking status: Never Smoker   . Smokeless tobacco: Never Used     Comment: "used to smoke pipe when I was younger"  . Alcohol Use: 2.4 oz/week    2 Glasses of wine, 2 Shots of liquor per week     Comment: occasional  . Drug Use: No  . Sexual Activity: Yes   Other Topics Concern  . None   Social History Narrative   Caffeine: 1 cup coffee/day   Divorced   Occupation: ophthalmologist   Edu: MD   Activity: rehab s/p CVA   Diet: some water, fruits/vegetables daily   POA - [name] (Daughter)   Past Surgical History  Procedure Laterality Date    . Prostate surgery  02/2009    PVP (laser)  . Tonsillectomy and adenoidectomy  ~ 1945  . Hemorrhoid surgery  1991  . Cataract extraction w/ intraocular lens implant  11//2012 - 08/2011  . Inguinal hernia repair  1981; 2002    left, then repaired  . Skin cancer excision  2002    bridge of nose, BCC  . Colonoscopy  05/2011    normal per pt (Dr. [name], [place], [name])  . Mri brain  04/2010    WNL  . Esophagogastroduodenoscopy  2006    duodenitis, medual HH, Grade 2 reflux, mild gastritis  . Colonoscopy  03/2011    poor prep, unable to biopsy polyp in tic fold, rpt 3 mo [name])  . Abi  2007    WNL  . Urethral dilation  2014    tannenbaum  . Cystoscopy with urethral dilatation N/A 07/24/2013    Procedure: CYSTOSCOPY WITH URETHRAL DILATATION;  Surgeon: [name], MD;  Location: [facility];  Service: Urology;  Laterality: N/A;  . Transurethral incision of prostate N/A 07/24/2013    Procedure: TRANSURETHRAL INCISION OF THE PROSTATE (TUIP);  Surgeon: [name], MD;  Location: [facility];  Service: Urology;  Laterality: N/A;  . Esophagogastroduodenoscopy  07/2011    mild chronic gastritis  . Colonoscopy  06/2011    mult polyps adenomatous, severe diverticulosis, rpt 3 yrs [name])  . Carotid endarterectomy    . Cardiac catheterization  2004    normal; [facility]  . Cardiovascular stress test  02/2014    WNL, low risk scan, EF 68%   Past Medical History  Diagnosis Date  . HLD (hyperlipidemia)   . Restless leg syndrome     well controlled on mirapex  . OSA on CPAP     sleep study 01/2014 - severe, CPAP at 10, previously on nuvigil (Dr. [name])  . Type 2 diabetes, controlled, with neuropathy 2011  . H/O hiatal hernia   . GERD (gastroesophageal reflux disease)     with sliding HH  . Migraines 03/11/12    now rare  . CVA (cerebral infarction) 03/07/12    slurred speech; right hemplegia, left handed - completed PT 07/2014 (17 sessions)  . Basal cell carcinoma of nose  2002    bridge of nose  . History of kidney problems 12/2010    lacerated left kidney after fall  . BPH with obstruction/lower urinary tract symptoms     failed flomax, uroxatral, myrbetriq - sees urology [name]) pending videourodynamics  . Depression     mild, on cymbalta per prior PCP  . Palpitations 01/2012    holter WNL, rare PAC/PVCs  . Erectile dysfunction     per urology (prostaglandin injection)  . Kidney cysts 02/2014    bilateral (renal US)  . CKD (chronic kidney disease) stage 3, GFR 30-59 ml/min 03/11/2012    Renal US 02/2014 - multiple kidney cysts   . TIA (transient ischemic attack)   . Anemia    BP 144/62 mmHg  Pulse 78  Resp 14  Ht 5\' 8"  (1.727 m)  SpO2 100%  Opioid Risk Score:   Fall Risk Score: Low Fall Risk (0-5 points)  Review of Systems  Constitutional: Negative.   HENT: Negative.   Eyes: Negative.   Respiratory: Negative.   Cardiovascular: Negative.   Gastrointestinal: Negative.   Endocrine: Negative.   Genitourinary: Negative.   Musculoskeletal: Negative.   Allergic/Immunologic: Negative.   Neurological: Negative.        Loss of smell  Hematological: Negative.   Psychiatric/Behavioral: Negative.        Objective:   Physical Exam  MAS 1 finger flexors, MAS 0 wrist flexors  Gait with AFO hinged no foot drag or knee instability  Motor strength is 3 minus at the right deltoid, biceps triceps finger flexors and extensors 4 minus at the right hip flexor and knee extensor and 3 minus at the ankle dorsiflexor 5/5 on the left side Mood and affect are appropriate      Assessment & Plan:  1. Right spastic hemiplegia secondary to CVA 2-1/2 years ago, he does have unable follow however he finds that it limits his plantar flexion and push off during gait. I think he would benefit from an anterior carbon fiber type AFO Has some chronic foot and ankle swelling on the right side related to his weakness, recommend knee-high support hose 20-30  mmHg  Last Botox injection has given him the greatest functional improvement, is also getting OT as an outpatient. We'll repeat injection in about 2 months

## 2014-08-12 ENCOUNTER — Ambulatory Visit: Payer: Medicare Other

## 2014-08-12 DIAGNOSIS — R269 Unspecified abnormalities of gait and mobility: Secondary | ICD-10-CM

## 2014-08-12 DIAGNOSIS — R279 Unspecified lack of coordination: Secondary | ICD-10-CM | POA: Diagnosis not present

## 2014-08-12 DIAGNOSIS — I69898 Other sequelae of other cerebrovascular disease: Secondary | ICD-10-CM | POA: Diagnosis not present

## 2014-08-12 DIAGNOSIS — R531 Weakness: Secondary | ICD-10-CM | POA: Diagnosis not present

## 2014-08-12 DIAGNOSIS — R609 Edema, unspecified: Secondary | ICD-10-CM | POA: Diagnosis not present

## 2014-08-12 DIAGNOSIS — I6980 Unspecified sequelae of other cerebrovascular disease: Secondary | ICD-10-CM | POA: Diagnosis not present

## 2014-08-12 DIAGNOSIS — IMO0002 Reserved for concepts with insufficient information to code with codable children: Secondary | ICD-10-CM

## 2014-08-12 NOTE — Patient Instructions (Signed)
Abduction: Clam (Eccentric) - Side-Lying   Lie on side with knees bent. Lift R knee, keeping feet together. Keep trunk steady. Hold for 2 seconds. _10__ reps per set, _3__ sets per day, _4__ days per week.  Copyright  VHI. All rights reserved.   All balance exercises performed in a corner with a chair in front of you for safety. Feet Apart, Arm Motion - Eyes Closed   With eyes closed and feet shoulder width apart, with arms at your side and hold for 20-30 seconds. Repeat _3___ times per session. Do __1-2__ sessions per day.  Copyright  VHI. All rights reserved.  Feet Together, Head Motion - Eyes Open   With eyes open and feet together, move head slowly, up and down and side to side, for 30 seconds. Repeat _3___ times per session. Do _1-2___ sessions per day.  Copyright  VHI. All rights reserved.  Feet Partial Heel-Toe, Arm Motion - Eyes Open   With eyes open, right foot partially in front of the other, arms at your side, hold for 30 seconds. Repeat with L foot in front. Repeat __3__ times per session. Do _1-2___ sessions per day.  Copyright  VHI. All rights reserved.  Single Leg - Eyes Open   Holding support, lift right leg while maintaining balance over other leg. Progress to removing hands from support surface for longer periods of time. Hold_30___ seconds. Repeat __3__ times per session. Do _1-2___ sessions per day.  Copyright  VHI. All rights reserved.

## 2014-08-12 NOTE — Therapy (Signed)
Physical Therapy Treatment  Patient Details  Name: James Bell MRN: [id number] Date of Birth: 10/24/1930  Encounter Date: 08/12/2014      PT End of Session - 08/12/14 1028    Visit Number 5   Number of Visits 17   Authorization Type G-code required every 10th visit for Medicare.   PT Start Time [phone]   PT Stop Time 0931   PT Time Calculation (min) 45 min   Equipment Utilized During Treatment Gait belt   Activity Tolerance Patient tolerated treatment well   Behavior During Therapy [facility] for tasks assessed/performed      Past Medical History  Diagnosis Date  . HLD (hyperlipidemia)   . Restless leg syndrome     well controlled on mirapex  . OSA on CPAP     sleep study 01/2014 - severe, CPAP at 10, previously on nuvigil (Dr. [name])  . Type 2 diabetes, controlled, with neuropathy 2011  . H/O hiatal hernia   . GERD (gastroesophageal reflux disease)     with sliding HH  . Migraines 03/11/12    now rare  . CVA (cerebral infarction) 03/07/12    slurred speech; right hemplegia, left handed - completed PT 07/2014 (17 sessions)  . Basal cell carcinoma of nose 2002    bridge of nose  . History of kidney problems 12/2010    lacerated left kidney after fall  . BPH with obstruction/lower urinary tract symptoms     failed flomax, uroxatral, myrbetriq - sees urology [name]) pending videourodynamics  . Depression     mild, on cymbalta per prior PCP  . Palpitations 01/2012    holter WNL, rare PAC/PVCs  . Erectile dysfunction     per urology (prostaglandin injection)  . Kidney cysts 02/2014    bilateral (renal US)  . CKD (chronic kidney disease) stage 3, GFR 30-59 ml/min 03/11/2012    Renal US 02/2014 - multiple kidney cysts   . TIA (transient ischemic attack)   . Anemia     Past Surgical History  Procedure Laterality Date  . Prostate surgery  02/2009    PVP (laser)  . Tonsillectomy and adenoidectomy  ~ 1945  . Hemorrhoid surgery  1991  . Cataract extraction w/  intraocular lens implant  11//2012 - 08/2011  . Inguinal hernia repair  1981; 2002    left, then repaired  . Skin cancer excision  2002    bridge of nose, BCC  . Colonoscopy  05/2011    normal per pt (Dr. [name], [place], [name])  . Mri brain  04/2010    WNL  . Esophagogastroduodenoscopy  2006    duodenitis, medual HH, Grade 2 reflux, mild gastritis  . Colonoscopy  03/2011    poor prep, unable to biopsy polyp in tic fold, rpt 3 mo [name])  . Abi  2007    WNL  . Urethral dilation  2014    tannenbaum  . Cystoscopy with urethral dilatation N/A 07/24/2013    Procedure: CYSTOSCOPY WITH URETHRAL DILATATION;  Surgeon: [name], MD;  Location: [facility];  Service: Urology;  Laterality: N/A;  . Transurethral incision of prostate N/A 07/24/2013    Procedure: TRANSURETHRAL INCISION OF THE PROSTATE (TUIP);  Surgeon: [name], MD;  Location: [facility];  Service: Urology;  Laterality: N/A;  . Esophagogastroduodenoscopy  07/2011    mild chronic gastritis  . Colonoscopy  06/2011    mult polyps adenomatous, severe diverticulosis, rpt 3 yrs [name])  . Carotid endarterectomy    .  Cardiac catheterization  2004    normal; [facility]  . Cardiovascular stress test  02/2014    WNL, low risk scan, EF 68%    There were no vitals taken for this visit.  Visit Diagnosis:  Abnormality of gait  Weakness due to cerebrovascular accident  Lack of coordination due to stroke      Subjective Assessment - 08/12/14 0852    Symptoms Pt denied falls since last visit. Pt is a little tired today. Pt is wearing R posterior AFO.   Currently in Pain? No/denies            [facility] Adult PT Treatment/Exercise - 08/12/14 0846    Ambulation/Gait   Ambulation/Gait Yes   Ambulation/Gait Assistance 5: Supervision   Ambulation/Gait Assistance Details Pt. ambulated with his R posterior AFO with supervision. Pt continues to experience genu recurvatum with post. AFO. VC's to decr. scissoring gait and narrow  BOS. No LOB episodes. Pt reported 1-2/10 R hip pain after ambulating around 4-5 minutes. Pt performed 6 minutes walk test and ambulated 800' without a rest break, but reported fatigue after test and requested seated rest break.   Ambulation Distance (Feet) 875 Feet   Assistive device Small based quad cane;Other (Comment)  R ottobock   Gait Pattern Step-through pattern;Decreased step length - left;Decreased stance time - right;Decreased dorsiflexion - right;Decreased dorsiflexion - left;Scissoring;Narrow base of support;Trunk flexed;Trendelenburg  improved foot clearance with decreased recurvatum   Balance   Balance Assessed Yes   Static Standing Balance   Static Standing - Balance Support No upper extremity supported  One UE support during single leg stance   Static Standing - Level of Assistance 4: Min assist;Other (comment);5: Stand by assistance  Min guard   Static Standing - Comment/# of Minutes Performed in corner with chair for safety: Feet apart with eyes closed (EC)/eyes open(EO), feet together with EO/EC, modified tandem stance with EO. 2 sets with 10-30 second holds. VC's to improve weight shifting and technique. Min A during 1 LOB episode during feet together and eyes closed.   Single Leg Stance - Right Leg 10  seconds   Single Leg Stance - Left Leg 10  seconds   Knee/Hip Exercises: Sidelying   Clams R LE 3x10, with rest breaks after each set 2/2 fatigue.          PT Education - 08/12/14 1028    Education provided Yes   Education Details Balance and clam HEP   Person(s) Educated Patient   Methods Explanation;Demonstration;Handout;Verbal cues   Comprehension Verbalized understanding;Returned demonstration          PT Short Term Goals - 08/12/14 1037    PT SHORT TERM GOAL #1   Title Pt will be independent in HEP to improve strength and functional mobility. (08/25/14)   Status On-going   PT SHORT TERM GOAL #2   Title Pt will be improve BERG score to >/=40/56 to  decrease falls risk. (08/25/14)   Status On-going   PT SHORT TERM GOAL #3   Title Pt will perform TUG, with LRAD, in </=13 seconds to decrease falls risk. (08/25/14)   Status On-going   PT SHORT TERM GOAL #4   Title Pt will improve gait speed to >/= 2.56ft/sec., with LRAD, to improve functional mobility. (08/25/14)   Status On-going   PT SHORT TERM GOAL #5   Title Pt will ambulate 400' with LRAD, over even/uneven terrain, with supervision to improve functional mobility. (08/25/14)   Status On-going  PT Long Term Goals - 08/12/14 1037    PT LONG TERM GOAL #1   Title Pt will be able to verbalize understanding of falls prevention strategies to reduce risk of falls. (09/22/14)   Status On-going   PT LONG TERM GOAL #2   Title Pt will improve BERG score to >/=44/56 to decrease falls risk. (09/22/14)   Status On-going   PT LONG TERM GOAL #3   Title Pt will improve gait speed, with LRAD, to >/=3.20ft/sec. to improve functional mobility. (09/22/14)   Status On-going   PT LONG TERM GOAL #4   Title Pt will be able to ambulate >/= 15 minutes without back pain. (09/22/14)   Status On-going   PT LONG TERM GOAL #5   Title Pt will be able to ambulate 600', with LRAD, over even/uneven terrain at MOD I level to improve functional mobility. (09/22/14)   Status On-going   PT LONG TERM GOAL #6   Title Pt will improve SIS-mobility score by 10% to improve quality of life. (09/22/14)   Status On-going          Plan - 08/12/14 1029    Clinical Impression Statement Pt demonstrated progressed as he required less assist during ambulation. Pt continues to experience R genu recurvatum with R post. AFO and would benefit from ant. AFO; pt received MD prescription for AFO and PT will send appointment request to [facility]. Pt ambulated 800' with small based quad cane during the 6 minute walk test, with norm values for healthy male adults (80-89) >1,000', indicating pt has decr. endurance. Pt would continue to  benefit from skilled therapy to improve safety during functional mobility.   Pt will benefit from skilled therapeutic intervention in order to improve on the following deficits Abnormal gait;Decreased endurance;Decreased balance;Decreased mobility;Decreased strength;Decreased knowledge of use of DME;Decreased range of motion;Impaired flexibility;Impaired UE functional use   Rehab Potential Good   PT Frequency 2x / week   PT Duration 8 weeks   PT Treatment/Interventions ADLs/Self Care Home Management;DME Instruction;Balance training;Manual techniques;Therapeutic exercise;Therapeutic activities;Patient/family education;Functional mobility training;Stair training;Gait training;Neuromuscular re-education   PT Next Visit Plan Core stability training, dynamic gait and balance training.   Consulted and Agree with Plan of Care Patient        Problem List Patient Active Problem List   Diagnosis Date Noted  . Spastic hemiparesis affecting dominant side 07/07/2014  . Right leg swelling 06/09/2014  . Left shoulder pain 03/16/2014  . Chest pressure 02/03/2014  . Spastic hemiplegia affecting dominant side 11/07/2013  . Medicare annual wellness visit, subsequent 11/06/2013  . ED (erectile dysfunction) 02/05/2013  . Depression   . Palpitations 06/26/2012  . HLD (hyperlipidemia)   . Restless leg syndrome   . GERD (gastroesophageal reflux disease)   . BPH with obstruction/lower urinary tract symptoms   . CVA (cerebrovascular accident) 03/11/2012  . Right hemiparesis 03/11/2012  . Type 2 diabetes, controlled, with neuropathy 03/11/2012  . OSA (obstructive sleep apnea) 03/11/2012  . CKD (chronic kidney disease) stage 3, GFR 30-59 ml/min 03/11/2012  . Migraines 03/11/2012                                              Tammie Yanda L 08/12/2014, 10:43 AM     Exzavier Ruderman L, PT

## 2014-08-18 ENCOUNTER — Ambulatory Visit: Payer: Medicare Other | Admitting: Occupational Therapy

## 2014-08-18 ENCOUNTER — Ambulatory Visit: Payer: Medicare Other | Attending: Physical Medicine & Rehabilitation

## 2014-08-18 DIAGNOSIS — R269 Unspecified abnormalities of gait and mobility: Secondary | ICD-10-CM | POA: Diagnosis not present

## 2014-08-18 DIAGNOSIS — R609 Edema, unspecified: Secondary | ICD-10-CM | POA: Insufficient documentation

## 2014-08-18 DIAGNOSIS — R279 Unspecified lack of coordination: Secondary | ICD-10-CM | POA: Diagnosis not present

## 2014-08-18 DIAGNOSIS — R531 Weakness: Secondary | ICD-10-CM | POA: Diagnosis not present

## 2014-08-18 DIAGNOSIS — I69898 Other sequelae of other cerebrovascular disease: Secondary | ICD-10-CM | POA: Insufficient documentation

## 2014-08-18 DIAGNOSIS — I6980 Unspecified sequelae of other cerebrovascular disease: Secondary | ICD-10-CM | POA: Diagnosis not present

## 2014-08-18 DIAGNOSIS — IMO0002 Reserved for concepts with insufficient information to code with codable children: Secondary | ICD-10-CM

## 2014-08-18 NOTE — Patient Instructions (Signed)
Lower Trunk Rotation / Pelvic Opener   Lie on your back with knees bent, slowly lower your legs to R side, and hold for 30 seconds and repeat with legs to L side. Keep knees together during stretch. Hold each position _30__ seconds. Repeat __3_ times. Do _2-3__ sessions per day.   Copyright  VHI. All rights reserved.  Isometric Stabilization   Tighten abdominal muscles as if tightening a belt. Hold __5_ seconds. Do __10_ times, _1__ times per day.  http://ss.exer.us/0   Copyright  VHI. All rights reserved.  Knee to Chest: [facility]   Lie with boths knees bent.Bring bent knee up. Be sure pelvis does not tilt or rotate. March one knee and then the other while contracting transverse abdominus muscle. Restabilize pelvis and relax. Repeat 10 times.  Do _2__ sets, _1 times per day.  HIP: Hamstrings - Short Sitting   Rest leg on floor Keep knee straight. Lift chest, bend at the hips and not the back. Hold _30__ seconds. _3__ reps per set, _2-3__ sets per day, __7_ days per week  Copyright  VHI. All rights reserved.        http://ss.exer.us/10   Copyright  VHI. All rights reserved.

## 2014-08-18 NOTE — Therapy (Signed)
Occupational Therapy Treatment  Patient Details  Name: James Bell MRN: [id number] Date of Birth: 1931/03/31  Encounter Date: 08/18/2014      OT End of Session - 08/18/14 1203    Visit Number 3  3/10 G   Number of Visits 17   Date for OT Re-Evaluation 10/02/14  STG's due 09/04/14   Authorization Type MCR TRAD, [employer]ICARE   OT Start Time 0935   OT Stop Time 1015   OT Time Calculation (min) 40 min      Past Medical History  Diagnosis Date  . HLD (hyperlipidemia)   . Restless leg syndrome     well controlled on mirapex  . OSA on CPAP     sleep study 01/2014 - severe, CPAP at 10, previously on nuvigil (Dr. [name])  . Type 2 diabetes, controlled, with neuropathy 2011  . H/O hiatal hernia   . GERD (gastroesophageal reflux disease)     with sliding HH  . Migraines 03/11/12    now rare  . CVA (cerebral infarction) 03/07/12    slurred speech; right hemplegia, left handed - completed PT 07/2014 (17 sessions)  . Basal cell carcinoma of nose 2002    bridge of nose  . History of kidney problems 12/2010    lacerated left kidney after fall  . BPH with obstruction/lower urinary tract symptoms     failed flomax, uroxatral, myrbetriq - sees urology [name]) pending videourodynamics  . Depression     mild, on cymbalta per prior PCP  . Palpitations 01/2012    holter WNL, rare PAC/PVCs  . Erectile dysfunction     per urology (prostaglandin injection)  . Kidney cysts 02/2014    bilateral (renal US)  . CKD (chronic kidney disease) stage 3, GFR 30-59 ml/min 03/11/2012    Renal US 02/2014 - multiple kidney cysts   . TIA (transient ischemic attack)   . Anemia     Past Surgical History  Procedure Laterality Date  . Prostate surgery  02/2009    PVP (laser)  . Tonsillectomy and adenoidectomy  ~ 1945  . Hemorrhoid surgery  1991  . Cataract extraction w/ intraocular lens implant  11//2012 - 08/2011  . Inguinal hernia repair  1981; 2002    left, then repaired  . Skin cancer  excision  2002    bridge of nose, BCC  . Colonoscopy  05/2011    normal per pt (Dr. [name], [place], [name])  . Mri brain  04/2010    WNL  . Esophagogastroduodenoscopy  2006    duodenitis, medual HH, Grade 2 reflux, mild gastritis  . Colonoscopy  03/2011    poor prep, unable to biopsy polyp in tic fold, rpt 3 mo [name])  . Abi  2007    WNL  . Urethral dilation  2014    tannenbaum  . Cystoscopy with urethral dilatation N/A 07/24/2013    Procedure: CYSTOSCOPY WITH URETHRAL DILATATION;  Surgeon: [name], MD;  Location: [facility];  Service: Urology;  Laterality: N/A;  . Transurethral incision of prostate N/A 07/24/2013    Procedure: TRANSURETHRAL INCISION OF THE PROSTATE (TUIP);  Surgeon: [name], MD;  Location: [facility];  Service: Urology;  Laterality: N/A;  . Esophagogastroduodenoscopy  07/2011    mild chronic gastritis  . Colonoscopy  06/2011    mult polyps adenomatous, severe diverticulosis, rpt 3 yrs [name])  . Carotid endarterectomy    . Cardiac catheterization  2004    normal; [facility]  .  Cardiovascular stress test  02/2014    WNL, low risk scan, EF 68%    There were no vitals taken for this visit.  Visit Diagnosis:  Weakness due to cerebrovascular accident  Lack of coordination due to stroke  Edema      Subjective Assessment - 08/18/14 0939    Symptoms "The putty is helping some w/ my swelling and hand flexion". Pt reports able to extend extend elbow and perform sh. flexion since approx. 8 months ago from nerve block and botox   Currently in Pain? Yes   Pain Score 6    Pain Location Back  P.T. addressing            OT Treatments/Exercises (OP) - 08/18/14 8144    ADLs   ADL Comments Issued compression glove for edema management and instructed in wear and care. Also reviewed other edema reduction strategies   Hand Exercises   Joint Blocking Exercises Blocking MP's straight while focusing on IP flexion Rt hand w/ max difficulty.    Fine  Motor Coordination   Fine Motor Coordination Large Pegboard   Large Pegboard Pt placing large pegs in pegboard vertical surface (low level) w/ max difficulty, occasional min assist and compensations RUE. Pt required extra time and rest breaks. Flipping large cards over w/ max difficulty and compensations Rt hand.           OT Education - 08/18/14 1007    Education provided Yes   Education Details Review of edema reduction strategies and issued compression glove w/ wear and care instructions   Person(s) Educated Patient   Methods Explanation;Handout   Comprehension Verbalized understanding          OT Short Term Goals - 08/18/14 0954    OT SHORT TERM GOAL #1   Title Pt will be I home program   Status Achieved   OT SHORT TERM GOAL #2   Title Pt will demonstrate increased ability to perform gross grasp R hand inpreparation for increased I ADL's/homemaking tasks   Status On-going   OT SHORT TERM GOAL #3   Title Pt will demonstrate increased AROM R hand as evidenced by ability to perform flat fist in preparation for increased functional use RUE   Status On-going   OT SHORT TERM GOAL #4   Title Pt will independently state edema control techniques   Status Achieved          OT Long Term Goals - 08/18/14 1003    OT LONG TERM GOAL #1   Title Pt will demonstrate increased independence with ADLhomemaking as evidenced by his ability to use R & LUE's to remove small dish from oven   Baseline Initially using forearm as groos assist/to balance objects and LUE as dominant hand   Status On-going   OT LONG TERM GOAL #2   Title Pt will be I with upgraded HEP RUE   Status On-going   OT LONG TERM GOAL #3   Title Pt will demonstrate increased ability to perform grasp release as seen by improvement RUE Box and Blocks score by 10 or more blocks   Baseline Initial assessment RUE = 10 blocks   Status On-going   OT LONG TERM GOAL #4   Title Pt will reports increased functional use RUE as  functional assist for ADL's, homemaking tasks 80% of the time or greater.   Status On-going          Plan - 08/18/14 1205    Clinical Impression Statement STG #  4 met today. Pt progressing towards remaining STG's (2 and 3).    Plan AA/ROM and stretching RUE, wt bearing and scapula stabalization        Problem List Patient Active Problem List   Diagnosis Date Noted  . Spastic hemiparesis affecting dominant side 07/07/2014  . Right leg swelling 06/09/2014  . Left shoulder pain 03/16/2014  . Chest pressure 02/03/2014  . Spastic hemiplegia affecting dominant side 11/07/2013  . Medicare annual wellness visit, subsequent 11/06/2013  . ED (erectile dysfunction) 02/05/2013  . Depression   . Palpitations 06/26/2012  . HLD (hyperlipidemia)   . Restless leg syndrome   . GERD (gastroesophageal reflux disease)   . BPH with obstruction/lower urinary tract symptoms   . CVA (cerebrovascular accident) 03/11/2012  . Right hemiparesis 03/11/2012  . Type 2 diabetes, controlled, with neuropathy 03/11/2012  . OSA (obstructive sleep apnea) 03/11/2012  . CKD (chronic kidney disease) stage 3, GFR 30-59 ml/min 03/11/2012  . Migraines 03/11/2012                                       Redmond Baseman, OTR/L 08/18/2014 12:18 PM Phone [phone] FAX [phone]         Carey Bullocks 08/18/2014, 12:16 PM

## 2014-08-18 NOTE — Patient Instructions (Signed)
     Edema Reduction strategies:   1. Perform tendon gliding and putty exercises (previously issued) 2. Perform retrograde massage starting at fingertips and pushing down towards wrist 3. Wear compression glove Rt hand at night (hand wash 1x/week and lay flat to dry)

## 2014-08-18 NOTE — Therapy (Signed)
[facility] [address] [place], [name], [address] Phone: [phone]   Fax:  [phone]  Physical Therapy Treatment  Patient Details  Name: James Bell MRN: [id number] Date of Birth: 1931-02-09  Encounter Date: 08/18/2014      PT End of Session - 08/18/14 1623    Visit Number 6   Number of Visits 17   Authorization Type G-code required every 10th visit for Medicare.   PT Start Time 0847   PT Stop Time 0929   PT Time Calculation (min) 42 min   Activity Tolerance Patient limited by pain;Patient tolerated treatment well   Behavior During Therapy [facility] for tasks assessed/performed      Past Medical History  Diagnosis Date  . HLD (hyperlipidemia)   . Restless leg syndrome     well controlled on mirapex  . OSA on CPAP     sleep study 01/2014 - severe, CPAP at 10, previously on nuvigil (Dr. [name])  . Type 2 diabetes, controlled, with neuropathy 2011  . H/O hiatal hernia   . GERD (gastroesophageal reflux disease)     with sliding HH  . Migraines 03/11/12    now rare  . CVA (cerebral infarction) 03/07/12    slurred speech; right hemplegia, left handed - completed PT 07/2014 (17 sessions)  . Basal cell carcinoma of nose 2002    bridge of nose  . History of kidney problems 12/2010    lacerated left kidney after fall  . BPH with obstruction/lower urinary tract symptoms     failed flomax, uroxatral, myrbetriq - sees urology [name]) pending videourodynamics  . Depression     mild, on cymbalta per prior PCP  . Palpitations 01/2012    holter WNL, rare PAC/PVCs  . Erectile dysfunction     per urology (prostaglandin injection)  . Kidney cysts 02/2014    bilateral (renal US)  . CKD (chronic kidney disease) stage 3, GFR 30-59 ml/min 03/11/2012    Renal US 02/2014 - multiple kidney cysts   . TIA (transient ischemic attack)   . Anemia     Past Surgical History  Procedure Laterality Date  . Prostate surgery  02/2009     PVP (laser)  . Tonsillectomy and adenoidectomy  ~ 1945  . Hemorrhoid surgery  1991  . Cataract extraction w/ intraocular lens implant  11//2012 - 08/2011  . Inguinal hernia repair  1981; 2002    left, then repaired  . Skin cancer excision  2002    bridge of nose, BCC  . Colonoscopy  05/2011    normal per pt (Dr. [name], [place], [name])  . Mri brain  04/2010    WNL  . Esophagogastroduodenoscopy  2006    duodenitis, medual HH, Grade 2 reflux, mild gastritis  . Colonoscopy  03/2011    poor prep, unable to biopsy polyp in tic fold, rpt 3 mo [name])  . Abi  2007    WNL  . Urethral dilation  2014    tannenbaum  . Cystoscopy with urethral dilatation N/A 07/24/2013    Procedure: CYSTOSCOPY WITH URETHRAL DILATATION;  Surgeon: [name], MD;  Location: [facility];  Service: Urology;  Laterality: N/A;  . Transurethral incision of prostate N/A 07/24/2013    Procedure: TRANSURETHRAL INCISION OF THE PROSTATE (TUIP);  Surgeon: [name], MD;  Location: [facility];  Service: Urology;  Laterality: N/A;  . Esophagogastroduodenoscopy  07/2011    mild chronic gastritis  . Colonoscopy  06/2011  mult polyps adenomatous, severe diverticulosis, rpt 3 yrs [name])  . Carotid endarterectomy    . Cardiac catheterization  2004    normal; [facility]  . Cardiovascular stress test  02/2014    WNL, low risk scan, EF 68%    There were no vitals taken for this visit.  Visit Diagnosis:  Abnormality of gait  Weakness due to cerebrovascular accident  Lack of coordination due to stroke      Subjective Assessment - 08/18/14 0851    Symptoms Pt reported he has been experiencing R leg spasms for the last two days, and he's not sure why it started. Pt denied falls since last visit.   Currently in Pain? Yes   Pain Score 6    Pain Location Back  R lower back   Pain Descriptors / Indicators Sharp;Aching   Pain Onset In the past 7 days   Pain Frequency Intermittent   Aggravating Factors   Getting up and putting weight on leg.   Pain Relieving Factors Lying still in bed and ambulating for several minutes.     Ambulation: Pt ambulated 4' with quad cane and R AFO donned with min guard. Pt required min A to maintain balance during one LOB episode due to R LE and back pain, which caused scissoring gait. VC's to improve narrow BOS.  Therex:  1) Supine Lower trunk rotation stretch: 4x30-66minute hold. PT performed stretch and then pt. VC's for technique. 2) Supine TrA activation with 5 second hold x10. VC's and tactile cues for technique. 3) Supine B marching (knee to chest) with TrA activation 2x10. VC's for technique and to decr. Breath holding. 4) Seated B hamstring stretch 3x30 second holds. VC's for technique and to keep back straight. Unable to trial bridging as it increased back pain. Pt required rest breaks after each set. Pt reported 0/10 pain after performing therex.  PT assess for R LE spasticity and none noted, however, PT did not increased R hamstring muscle tone.         PT Education - 08/18/14 1623    Education provided Yes   Education Details Stretching and strengthening HEP   Person(s) Educated Patient   Methods Explanation;Demonstration;Handout;Verbal cues   Comprehension Verbalized understanding;Returned demonstration          PT Short Term Goals - 08/18/14 1627    PT SHORT TERM GOAL #1   Title Pt will be independent in HEP to improve strength and functional mobility. (08/25/14)   Status On-going   PT SHORT TERM GOAL #2   Title Pt will be improve BERG score to >/=40/56 to decrease falls risk. (08/25/14)   Status On-going   PT SHORT TERM GOAL #3   Title Pt will perform TUG, with LRAD, in </=13 seconds to decrease falls risk. (08/25/14)   Status On-going   PT SHORT TERM GOAL #4   Title Pt will improve gait speed to >/= 2.44ft/sec., with LRAD, to improve functional mobility. (08/25/14)   Status On-going   PT SHORT TERM GOAL #5   Title Pt will  ambulate 400' with LRAD, over even/uneven terrain, with supervision to improve functional mobility. (08/25/14)   Status On-going          PT Long Term Goals - 08/18/14 1627    PT LONG TERM GOAL #1   Title Pt will be able to verbalize understanding of falls prevention strategies to reduce risk of falls. (09/22/14)   Status On-going   PT LONG TERM GOAL #2  Title Pt will improve BERG score to >/=44/56 to decrease falls risk. (09/22/14)   Status On-going   PT LONG TERM GOAL #3   Title Pt will improve gait speed, with LRAD, to >/=3.41ft/sec. to improve functional mobility. (09/22/14)   Status On-going   PT LONG TERM GOAL #4   Title Pt will be able to ambulate >/= 15 minutes without back pain. (09/22/14)   Status On-going   PT LONG TERM GOAL #5   Title Pt will be able to ambulate 600', with LRAD, over even/uneven terrain at MOD I level to improve functional mobility. (09/22/14)   Status On-going   PT LONG TERM GOAL #6   Title Pt will improve SIS-mobility score by 10% to improve quality of life. (09/22/14)   Status On-going          Plan - 08/18/14 1624    Clinical Impression Statement Pt limited due to pain today, and required frequent rest breaks. However, pt reported pain decr. to 0/10 after manual therapy and core exercises. Pt experience one LOB episode during ambulation today and required min A to maintain balance, so pt would continue to benefit from skilled PT to improve safety during functional mobility.   Pt will benefit from skilled therapeutic intervention in order to improve on the following deficits Abnormal gait;Decreased endurance;Decreased balance;Decreased mobility;Decreased strength;Decreased knowledge of use of DME;Decreased range of motion;Impaired flexibility;Impaired UE functional use   Rehab Potential Good   PT Frequency 2x / week   PT Duration 8 weeks   PT Treatment/Interventions ADLs/Self Care Home Management;DME Instruction;Balance training;Manual techniques;Therapeutic  exercise;Therapeutic activities;Patient/family education;Functional mobility training;Stair training;Gait training;Neuromuscular re-education   PT Next Visit Plan Trial different AFOs, genu recurvatum may incr. with ant. AFO so Warner Mccreedy may be better choice. Continue dynamic gait training and core stability trianing.   Consulted and Agree with Plan of Care Patient                               Problem List Patient Active Problem List   Diagnosis Date Noted  . Spastic hemiparesis affecting dominant side 07/07/2014  . Right leg swelling 06/09/2014  . Left shoulder pain 03/16/2014  . Chest pressure 02/03/2014  . Spastic hemiplegia affecting dominant side 11/07/2013  . Medicare annual wellness visit, subsequent 11/06/2013  . ED (erectile dysfunction) 02/05/2013  . Depression   . Palpitations 06/26/2012  . HLD (hyperlipidemia)   . Restless leg syndrome   . GERD (gastroesophageal reflux disease)   . BPH with obstruction/lower urinary tract symptoms   . CVA (cerebrovascular accident) 03/11/2012  . Right hemiparesis 03/11/2012  . Type 2 diabetes, controlled, with neuropathy 03/11/2012  . OSA (obstructive sleep apnea) 03/11/2012  . CKD (chronic kidney disease) stage 3, GFR 30-59 ml/min 03/11/2012  . Migraines 03/11/2012    Tesneem Dufrane L 08/18/2014, 4:28 PM     Geoffry Paradise, PT,DPT 08/18/2014 4:28 PM Phone: [phone] Fax: [phone]

## 2014-08-20 ENCOUNTER — Ambulatory Visit: Payer: Medicare Other

## 2014-08-20 DIAGNOSIS — R269 Unspecified abnormalities of gait and mobility: Secondary | ICD-10-CM

## 2014-08-20 DIAGNOSIS — IMO0002 Reserved for concepts with insufficient information to code with codable children: Secondary | ICD-10-CM

## 2014-08-20 NOTE — Therapy (Signed)
[facility] [address] [place], [name], [address] Phone: [phone]   Fax:  [phone]  Physical Therapy Treatment  Patient Details  Name: James Bell MRN: [id number] Date of Birth: 1931/03/22  Encounter Date: 08/20/2014      PT End of Session - 08/20/14 1452    Visit Number 7   Number of Visits 17   Authorization Type G-code required every 10th visit for Medicare.   PT Start Time [phone]   PT Stop Time 0930   PT Time Calculation (min) 44 min   Equipment Utilized During Treatment Gait belt   Activity Tolerance Patient limited by pain;Patient tolerated treatment well   Behavior During Therapy [facility] for tasks assessed/performed      Past Medical History  Diagnosis Date  . HLD (hyperlipidemia)   . Restless leg syndrome     well controlled on mirapex  . OSA on CPAP     sleep study 01/2014 - severe, CPAP at 10, previously on nuvigil (Dr. [name])  . Type 2 diabetes, controlled, with neuropathy 2011  . H/O hiatal hernia   . GERD (gastroesophageal reflux disease)     with sliding HH  . Migraines 03/11/12    now rare  . CVA (cerebral infarction) 03/07/12    slurred speech; right hemplegia, left handed - completed PT 07/2014 (17 sessions)  . Basal cell carcinoma of nose 2002    bridge of nose  . History of kidney problems 12/2010    lacerated left kidney after fall  . BPH with obstruction/lower urinary tract symptoms     failed flomax, uroxatral, myrbetriq - sees urology [name]) pending videourodynamics  . Depression     mild, on cymbalta per prior PCP  . Palpitations 01/2012    holter WNL, rare PAC/PVCs  . Erectile dysfunction     per urology (prostaglandin injection)  . Kidney cysts 02/2014    bilateral (renal US)  . CKD (chronic kidney disease) stage 3, GFR 30-59 ml/min 03/11/2012    Renal US 02/2014 - multiple kidney cysts   . TIA (transient ischemic attack)   . Anemia     Past Surgical History   Procedure Laterality Date  . Prostate surgery  02/2009    PVP (laser)  . Tonsillectomy and adenoidectomy  ~ 1945  . Hemorrhoid surgery  1991  . Cataract extraction w/ intraocular lens implant  11//2012 - 08/2011  . Inguinal hernia repair  1981; 2002    left, then repaired  . Skin cancer excision  2002    bridge of nose, BCC  . Colonoscopy  05/2011    normal per pt (Dr. [name], [place], [name])  . Mri brain  04/2010    WNL  . Esophagogastroduodenoscopy  2006    duodenitis, medual HH, Grade 2 reflux, mild gastritis  . Colonoscopy  03/2011    poor prep, unable to biopsy polyp in tic fold, rpt 3 mo [name])  . Abi  2007    WNL  . Urethral dilation  2014    tannenbaum  . Cystoscopy with urethral dilatation N/A 07/24/2013    Procedure: CYSTOSCOPY WITH URETHRAL DILATATION;  Surgeon: [name], MD;  Location: [facility];  Service: Urology;  Laterality: N/A;  . Transurethral incision of prostate N/A 07/24/2013    Procedure: TRANSURETHRAL INCISION OF THE PROSTATE (TUIP);  Surgeon: [name], MD;  Location: [facility];  Service: Urology;  Laterality: N/A;  . Esophagogastroduodenoscopy  07/2011    mild chronic  gastritis  . Colonoscopy  06/2011    mult polyps adenomatous, severe diverticulosis, rpt 3 yrs [name])  . Carotid endarterectomy    . Cardiac catheterization  2004    normal; [facility]  . Cardiovascular stress test  02/2014    WNL, low risk scan, EF 68%    There were no vitals taken for this visit.  Visit Diagnosis:  Abnormality of gait  Weakness due to cerebrovascular accident  Lack of coordination due to stroke      Subjective Assessment - 08/20/14 0854    Symptoms Pt reported back and leg pain is better since last visit. Pt denied falls since last visit.    Limitations Standing   How long can you stand comfortably? 5 min   Patient Stated Goals To build strength in his legs and increase his ambulation distance without quad cane if possible.   Currently in  Pain? No/denies            [facility] Adult PT Treatment/Exercise - 08/20/14 0846    Ambulation/Gait   Ambulation/Gait Yes   Ambulation/Gait Assistance 5: Supervision   Ambulation/Gait Assistance Details Pt ambulated while trialing R toe off and R [place] AFO. Pt experienced improved foot clearance and decr. R genu recurvatum with R [place] and 1/4" heel wedge. VC's to decr. scissor gait and to improve upright posture. Pt required seated rest breaks during ambulation due to fatigue.   Ambulation Distance (Feet) --  450'x2 and 64'   Assistive device Small based quad cane;Other (Comment)   Gait Pattern Step-through pattern;Decreased step length - left;Decreased stance time - right;Decreased dorsiflexion - right;Decreased dorsiflexion - left;Scissoring;Narrow base of support;Trunk flexed;Trendelenburg;Right genu recurvatum   Exercises   Exercises Knee/Hip  Reviewed HEP.   Knee/Hip Exercises: Stretches   Active Hamstring Stretch 3 reps;30 seconds  R LE   Active Hamstring Stretch Limitations Pt demonstrated proper technique.   Knee/Hip Exercises: Supine   Bridges Strengthening;Both;1 set;10 reps  with TrA activation.   Bridges Limitations VC's for technique and to keep B LE shoulder width apart.   Other Supine Knee Exercises Hooklying B hip marches x10 with TrA activation. VC's for technique, decr. LE adduction, and for correct TrA activation.  Pt required rest breaks after this exercise due to fatigue.            PT Short Term Goals - 08/20/14 1454    PT SHORT TERM GOAL #1   Title Pt will be independent in HEP to improve strength and functional mobility. (08/25/14)   Status On-going   PT SHORT TERM GOAL #2   Title Pt will be improve BERG score to >/=40/56 to decrease falls risk. (08/25/14)   Status On-going   PT SHORT TERM GOAL #3   Title Pt will perform TUG, with LRAD, in </=13 seconds to decrease falls risk. (08/25/14)   Status On-going   PT SHORT TERM GOAL #4   Title Pt will  improve gait speed to >/= 2.63ft/sec., with LRAD, to improve functional mobility. (08/25/14)   Status On-going   PT SHORT TERM GOAL #5   Title Pt will ambulate 400' with LRAD, over even/uneven terrain, with supervision to improve functional mobility. (08/25/14)   Status On-going          PT Long Term Goals - 08/20/14 1455    PT LONG TERM GOAL #1   Title Pt will be able to verbalize understanding of falls prevention strategies to reduce risk of falls. (09/22/14)   Status On-going  PT LONG TERM GOAL #2   Title Pt will improve BERG score to >/=44/56 to decrease falls risk. (09/22/14)   Status On-going   PT LONG TERM GOAL #3   Title Pt will improve gait speed, with LRAD, to >/=3.19ft/sec. to improve functional mobility. (09/22/14)   Status On-going   PT LONG TERM GOAL #4   Title Pt will be able to ambulate >/= 15 minutes without back pain. (09/22/14)   Status On-going   PT LONG TERM GOAL #5   Title Pt will be able to ambulate 600', with LRAD, over even/uneven terrain at MOD I level to improve functional mobility. (09/22/14)   Status On-going   PT LONG TERM GOAL #6   Title Pt will improve SIS-mobility score by 10% to improve quality of life. (09/22/14)   Status On-going          Plan - 08/20/14 1453    Clinical Impression Statement Pt demonstrated progress today as he reported 0/10 back and LE pain. Pt demonstrated decr. R genu recurvatum with R [place] AFO and heel wedge; PT is awaiting to hear back from [facility] to schedule orthotist appt. Pt would continue to benefit from skilled PT to improve safety during funcitonal mobility.   Pt will benefit from skilled therapeutic intervention in order to improve on the following deficits Abnormal gait;Decreased endurance;Decreased balance;Decreased mobility;Decreased strength;Decreased knowledge of use of DME;Decreased range of motion;Impaired flexibility;Impaired UE functional use   Rehab Potential Good   PT Frequency 2x / week   PT Duration 8 weeks    PT Treatment/Interventions ADLs/Self Care Home Management;DME Instruction;Balance training;Manual techniques;Therapeutic exercise;Therapeutic activities;Patient/family education;Functional mobility training;Stair training;Gait training;Neuromuscular re-education   PT Next Visit Plan Assess STGs.   Consulted and Agree with Plan of Care Patient                               Problem List Patient Active Problem List   Diagnosis Date Noted  . Spastic hemiparesis affecting dominant side 07/07/2014  . Right leg swelling 06/09/2014  . Left shoulder pain 03/16/2014  . Chest pressure 02/03/2014  . Spastic hemiplegia affecting dominant side 11/07/2013  . Medicare annual wellness visit, subsequent 11/06/2013  . ED (erectile dysfunction) 02/05/2013  . Depression   . Palpitations 06/26/2012  . HLD (hyperlipidemia)   . Restless leg syndrome   . GERD (gastroesophageal reflux disease)   . BPH with obstruction/lower urinary tract symptoms   . CVA (cerebrovascular accident) 03/11/2012  . Right hemiparesis 03/11/2012  . Type 2 diabetes, controlled, with neuropathy 03/11/2012  . OSA (obstructive sleep apnea) 03/11/2012  . CKD (chronic kidney disease) stage 3, GFR 30-59 ml/min 03/11/2012  . Migraines 03/11/2012    Deslyn Cavenaugh L 08/20/2014, 2:56 PM     Geoffry Paradise, PT,DPT 08/20/2014 2:56 PM Phone: [phone] Fax: [phone]

## 2014-08-21 ENCOUNTER — Ambulatory Visit: Payer: Medicare Other | Admitting: Physical Medicine & Rehabilitation

## 2014-08-25 ENCOUNTER — Ambulatory Visit: Payer: Medicare Other | Admitting: Occupational Therapy

## 2014-08-25 ENCOUNTER — Ambulatory Visit: Payer: Medicare Other

## 2014-08-25 DIAGNOSIS — IMO0002 Reserved for concepts with insufficient information to code with codable children: Secondary | ICD-10-CM

## 2014-08-25 DIAGNOSIS — R279 Unspecified lack of coordination: Secondary | ICD-10-CM

## 2014-08-25 DIAGNOSIS — R269 Unspecified abnormalities of gait and mobility: Secondary | ICD-10-CM | POA: Diagnosis not present

## 2014-08-25 DIAGNOSIS — N401 Enlarged prostate with lower urinary tract symptoms: Secondary | ICD-10-CM | POA: Diagnosis not present

## 2014-08-25 DIAGNOSIS — R350 Frequency of micturition: Secondary | ICD-10-CM | POA: Diagnosis not present

## 2014-08-25 DIAGNOSIS — R609 Edema, unspecified: Secondary | ICD-10-CM

## 2014-08-25 DIAGNOSIS — N5201 Erectile dysfunction due to arterial insufficiency: Secondary | ICD-10-CM | POA: Diagnosis not present

## 2014-08-25 DIAGNOSIS — R278 Other lack of coordination: Secondary | ICD-10-CM

## 2014-08-25 NOTE — Therapy (Signed)
[facility] [address] [place], [place], [address] Phone: [phone]   Fax:  [phone]  Physical Therapy Treatment  Patient Details  Name: James Bell MRN: [id number] Date of Birth: 07/20/1931  Encounter Date: 08/25/2014      PT End of Session - 08/25/14 0936    Visit Number 8   Number of Visits 17   Authorization Type G-code required every 10th visit for Medicare.   PT Start Time 0845   PT Stop Time 0929   PT Time Calculation (min) 44 min   Equipment Utilized During Treatment Gait belt   Activity Tolerance Patient tolerated treatment well   Behavior During Therapy WFL for tasks assessed/performed      Past Medical History  Diagnosis Date  . HLD (hyperlipidemia)   . Restless leg syndrome     well controlled on mirapex  . OSA on CPAP     sleep study 01/2014 - severe, CPAP at 10, previously on nuvigil (Dr. Samia Athar)  . Type 2 diabetes, controlled, with neuropathy 2011  . H/O hiatal hernia   . GERD (gastroesophageal reflux disease)     with sliding HH  . Migraines 03/11/12    now rare  . CVA (cerebral infarction) 03/07/12    slurred speech; right hemplegia, left handed - completed PT 07/2014 (17 sessions)  . Basal cell carcinoma of nose 2002    bridge of nose  . History of kidney problems 12/2010    lacerated left kidney after fall  . BPH with obstruction/lower urinary tract symptoms     failed flomax, uroxatral, myrbetriq - sees urology ([name]) pending videourodynamics  . Depression     mild, on cymbalta per prior PCP  . Palpitations 01/2012    holter WNL, rare PAC/PVCs  . Erectile dysfunction     per urology (prostaglandin injection)  . Kidney cysts 02/2014    bilateral (renal US)  . CKD (chronic kidney disease) stage 3, GFR 30-59 ml/min 03/11/2012    Renal US 02/2014 - multiple kidney cysts   . TIA (transient ischemic attack)   . Anemia     Past Surgical History  Procedure Laterality Date  .  Prostate surgery  02/2009    PVP (laser)  . Tonsillectomy and adenoidectomy  ~ 1945  . Hemorrhoid surgery  1991  . Cataract extraction w/ intraocular lens implant  11//2012 - 08/2011  . Inguinal hernia repair  1981; 2002    left, then repaired  . Skin cancer excision  2002    bridge of nose, BCC  . Colonoscopy  05/2011    normal per pt (Dr. Faber, [place], )  . Mri brain  04/2010    WNL  . Esophagogastroduodenoscopy  2006    duodenitis, medual HH, Grade 2 reflux, mild gastritis  . Colonoscopy  03/2011    poor prep, unable to biopsy polyp in tic fold, rpt 3 mo (Faber)  . Abi  2007    WNL  . Urethral dilation  2014    tannenbaum  . Cystoscopy with urethral dilatation N/A 07/24/2013    Procedure: CYSTOSCOPY WITH URETHRAL DILATATION;  Surgeon: Sigmund I [name], MD;  Location: [facility];  Service: Urology;  Laterality: N/A;  . Transurethral incision of prostate N/A 07/24/2013    Procedure: TRANSURETHRAL INCISION OF THE PROSTATE (TUIP);  Surgeon: Sigmund I [name], MD;  Location: [facility];  Service: Urology;  Laterality: N/A;  . Esophagogastroduodenoscopy  07/2011    mild chronic gastritis  .   Colonoscopy  06/2011    mult polyps adenomatous, severe diverticulosis, rpt 3 yrs (Faber)  . Carotid endarterectomy    . Cardiac catheterization  2004    normal; [facility]  . Cardiovascular stress test  02/2014    WNL, low risk scan, EF 68%    There were no vitals taken for this visit.  Visit Diagnosis:  Abnormality of gait  Weakness due to cerebrovascular accident  Lack of coordination due to stroke      Subjective Assessment - 08/25/14 0848    Symptoms Pt reported he fell on Saturday, as he was bending down to put a train set together. He reported he hit his head on the wheelchair and R shoulder is sore.    Limitations Standing   How long can you stand comfortably? 5 min   Patient Stated Goals To build strength in his legs and increase his ambulation distance without quad cane  if possible.   Currently in Pain? Yes   Pain Score --  0/10 at rest. 2/10 during movement.   Pain Location Shoulder   Pain Orientation Right   Pain Descriptors / Indicators Aching;Discomfort   Pain Type Acute pain   Pain Onset In the past 7 days  after fall on 08/22/14.   Pain Frequency Intermittent   Aggravating Factors  Movement   Pain Relieving Factors Rest      PT advised pt to inform MD of fall, and educated on concussion syndromes. Pt denied symptoms and verbalized he would inform MD.       [facility] Adult PT Treatment/Exercise - 08/25/14 0852    Ambulation/Gait   Ambulation/Gait Yes   Ambulation/Gait Assistance 5: Supervision   Ambulation/Gait Assistance Details Pt ambulated over even/uneven terrain without any LOB episodes. VC's to decr. narrow BOS. R AFO donned.   Ambulation Distance (Feet) 460 Feet   Assistive device Small based quad cane;Other (Comment)  R AFO donned   Gait Pattern Step-through pattern;Decreased step length - left;Decreased stance time - right;Decreased dorsiflexion - right;Decreased dorsiflexion - left;Scissoring;Narrow base of support;Trunk flexed;Trendelenburg;Right genu recurvatum   Gait velocity 3.09ft/sec.  10MWT: 10.6 sec.   Standardized Balance Assessment   Standardized Balance Assessment Berg Balance Test;Timed Up and Go Test   Berg Balance Test   Sit to Stand Able to stand without using hands and stabilize independently   Standing Unsupported Able to stand safely 2 minutes   Sitting with Back Unsupported but Feet Supported on Floor or Stool Able to sit 2 minutes under supervision   Stand to Sit Sits safely with minimal use of hands   Transfers Able to transfer safely, minor use of hands   Standing Unsupported with Eyes Closed Able to stand 10 seconds with supervision   Standing Ubsupported with Feet Together Able to place feet together independently and stand for 1 minute with supervision   From Standing, Reach Forward with Outstretched Arm Can  reach confidently >25 cm (10")  11"   From Standing Position, Pick up Object from Floor Able to pick up shoe, needs supervision   From Standing Position, Turn to Look Behind Over each Shoulder Looks behind from both sides and weight shifts well   Turn 360 Degrees Able to turn 360 degrees safely but slowly   Standing Unsupported, Alternately Place Feet on Step/Stool Able to stand independently and complete 8 steps >20 seconds   Standing Unsupported, One Foot in Front Able to plae foot ahead of the other independently and hold 30 seconds   Standing   on One Leg Able to lift leg independently and hold equal to or more than 3 seconds   Total Score 46   Timed Up and Go Test   TUG Normal TUG   Normal TUG (seconds) 13.97  with quad cane            PT Short Term Goals - 08/25/14 0938    PT SHORT TERM GOAL #1   Title Pt will be independent in HEP to improve strength and functional mobility. (08/25/14)   Status On-going   PT SHORT TERM GOAL #2   Title Pt will be improve BERG score to >/=40/56 to decrease falls risk. (08/25/14)   Baseline 46/56 on 08/25/14.   Status Achieved   PT SHORT TERM GOAL #3   Title Pt will perform TUG, with LRAD, in </=13 seconds to decrease falls risk. (08/25/14)   Baseline 13.97 sec. on 08/25/14.   Status On-going   PT SHORT TERM GOAL #4   Title Pt will improve gait speed to >/= 2.75ft/sec., with LRAD, to improve functional mobility. (08/25/14)   Baseline 3.09ft/sec. on 08/25/14.   Status Achieved   PT SHORT TERM GOAL #5   Title Pt will ambulate 400' with LRAD, over even/uneven terrain, with supervision to improve functional mobility. (08/25/14)   Status Achieved          PT Long Term Goals - 08/25/14 0939    PT LONG TERM GOAL #1   Title Pt will be able to verbalize understanding of falls prevention strategies to reduce risk of falls. (09/22/14)   Status On-going   PT LONG TERM GOAL #2   Title Pt will improve BERG score to >/=49/56 to decrease falls risk. (09/22/14)    Baseline Revised from 44/56, as pt scored 46/56 on 08/25/14.   Status Revised   PT LONG TERM GOAL #3   Title Pt will improve gait speed, with LRAD, to >/=3.16ft/sec. to improve functional mobility. (09/22/14)   Status On-going   PT LONG TERM GOAL #4   Title Pt will be able to ambulate >/= 15 minutes without back pain. (09/22/14)   Status On-going   PT LONG TERM GOAL #5   Title Pt will be able to ambulate 600', with LRAD, over even/uneven terrain at MOD I level to improve functional mobility. (09/22/14)   Status On-going   PT LONG TERM GOAL #6   Title Pt will improve SIS-mobility score by 10% to improve quality of life. (09/22/14)   Status On-going          Plan - 08/25/14 0925    Clinical Impression Statement Pt demonstrated progress as he met 3/4 STGs and BERG LTG, PT will assess HEP goal next visit. Pt would continue to benefit from skilled therapy to improve safety during functional mobility, as pt reported falling at home since last visit. Pt would like PT to fax AFO request to another orthotic provider to see if they can schedule an appt. faster than [facility].   Pt will benefit from skilled therapeutic intervention in order to improve on the following deficits Abnormal gait;Decreased endurance;Decreased balance;Decreased mobility;Decreased strength;Decreased knowledge of use of DME;Decreased range of motion;Impaired flexibility;Impaired UE functional use   PT Frequency 2x / week   PT Duration 8 weeks   PT Treatment/Interventions ADLs/Self Care Home Management;DME Instruction;Balance training;Manual techniques;Therapeutic exercise;Therapeutic activities;Patient/family education;Functional mobility training;Stair training;Gait training;Neuromuscular re-education   PT Next Visit Plan Assess HEP goal and progress as tolerated. Practice floor transfers.   Consulted and Agree with Plan   of Care Patient                               Problem List Patient Active Problem  List   Diagnosis Date Noted  . Spastic hemiparesis affecting dominant side 07/07/2014  . Right leg swelling 06/09/2014  . Left shoulder pain 03/16/2014  . Chest pressure 02/03/2014  . Spastic hemiplegia affecting dominant side 11/07/2013  . Medicare annual wellness visit, subsequent 11/06/2013  . ED (erectile dysfunction) 02/05/2013  . Depression   . Palpitations 06/26/2012  . HLD (hyperlipidemia)   . Restless leg syndrome   . GERD (gastroesophageal reflux disease)   . BPH with obstruction/lower urinary tract symptoms   . CVA (cerebrovascular accident) 03/11/2012  . Right hemiparesis 03/11/2012  . Type 2 diabetes, controlled, with neuropathy 03/11/2012  . OSA (obstructive sleep apnea) 03/11/2012  . CKD (chronic kidney disease) stage 3, GFR 30-59 ml/min 03/11/2012  . Migraines 03/11/2012    Kodi Guerrera L 08/25/2014, 9:41 AM    Geoffry Paradise, PT,DPT 08/25/2014 9:41 AM Phone: [phone] Fax: [phone]

## 2014-08-25 NOTE — Patient Instructions (Signed)
   1. Lay on stomach and squeeze shoulder blades together (up towards ceiling, NOT towards ears) x 20 reps, 1-2x/day  2. Sit up tall in firm chair, place Rt hand on chair and push straight up to a squat position, try to hold for 5 sec. X 10 reps 1-2x/day,  And lower slowly  3. Table slides with Rt arm, can try on slanted diagonal suface if able, keep elbow pointing down. 20 reps 2x/day

## 2014-08-25 NOTE — Therapy (Signed)
[facility] [address] [place], [name], [address] Phone: [phone]   Fax:  [phone]  Occupational Therapy Treatment  Patient Details  Name: James Bell MRN: [id number] Date of Birth: 1931/09/05  Encounter Date: 08/25/2014      OT End of Session - 08/25/14 1027    Visit Number 4  4/10G   Number of Visits 17   Date for OT Re-Evaluation 10/02/14  STG's due 09/04/14   Authorization Type MCR TRAD, TRICARE   OT Start Time 0935   OT Stop Time 1015   OT Time Calculation (min) 40 min   Activity Tolerance Patient tolerated treatment well      Past Medical History  Diagnosis Date  . HLD (hyperlipidemia)   . Restless leg syndrome     well controlled on mirapex  . OSA on CPAP     sleep study 01/2014 - severe, CPAP at 10, previously on nuvigil (Dr. [name])  . Type 2 diabetes, controlled, with neuropathy 2011  . H/O hiatal hernia   . GERD (gastroesophageal reflux disease)     with sliding HH  . Migraines 03/11/12    now rare  . CVA (cerebral infarction) 03/07/12    slurred speech; right hemplegia, left handed - completed PT 07/2014 (17 sessions)  . Basal cell carcinoma of nose 2002    bridge of nose  . History of kidney problems 12/2010    lacerated left kidney after fall  . BPH with obstruction/lower urinary tract symptoms     failed flomax, uroxatral, myrbetriq - sees urology [name]) pending videourodynamics  . Depression     mild, on cymbalta per prior PCP  . Palpitations 01/2012    holter WNL, rare PAC/PVCs  . Erectile dysfunction     per urology (prostaglandin injection)  . Kidney cysts 02/2014    bilateral (renal US)  . CKD (chronic kidney disease) stage 3, GFR 30-59 ml/min 03/11/2012    Renal US 02/2014 - multiple kidney cysts   . TIA (transient ischemic attack)   . Anemia     Past Surgical History  Procedure Laterality Date  . Prostate surgery  02/2009    PVP (laser)  . Tonsillectomy and  adenoidectomy  ~ 1945  . Hemorrhoid surgery  1991  . Cataract extraction w/ intraocular lens implant  11//2012 - 08/2011  . Inguinal hernia repair  1981; 2002    left, then repaired  . Skin cancer excision  2002    bridge of nose, BCC  . Colonoscopy  05/2011    normal per pt (Dr. [name], [place], [name])  . Mri brain  04/2010    WNL  . Esophagogastroduodenoscopy  2006    duodenitis, medual HH, Grade 2 reflux, mild gastritis  . Colonoscopy  03/2011    poor prep, unable to biopsy polyp in tic fold, rpt 3 mo [name])  . Abi  2007    WNL  . Urethral dilation  2014    [facility]  . Cystoscopy with urethral dilatation N/A 07/24/2013    Procedure: CYSTOSCOPY WITH URETHRAL DILATATION;  Surgeon: [name], MD;  Location: [facility];  Service: Urology;  Laterality: N/A;  . Transurethral incision of prostate N/A 07/24/2013    Procedure: TRANSURETHRAL INCISION OF THE PROSTATE (TUIP);  Surgeon: [name], MD;  Location: [facility];  Service: Urology;  Laterality: N/A;  . Esophagogastroduodenoscopy  07/2011    mild chronic gastritis  . Colonoscopy  06/2011    mult polyps  adenomatous, severe diverticulosis, rpt 3 yrs [name])  . Carotid endarterectomy    . Cardiac catheterization  2004    normal; [facility]  . Cardiovascular stress test  02/2014    WNL, low risk scan, EF 68%    There were no vitals taken for this visit.  Visit Diagnosis:  Weakness due to cerebrovascular accident  Lack of coordination due to stroke  Edema  Decreased coordination      Subjective Assessment - 08/25/14 0934    Symptoms (p) "I fell Saturday on my Rt side. I'm bruised, but nothing broken" (Pt also reports compression glove helps w/ edema when he can get it on)   Currently in Pain? (p) Yes   Pain Score (p) 2    Pain Location (p) Shoulder   Pain Orientation (p) Right   Pain Type (p) Acute pain   Pain Onset (p) In the past 7 days   Pain Frequency (p) Intermittent   Pain Relieving Factors (p)  rest            OT Treatments/Exercises (OP) - 08/25/14 1022    Exercises   Exercises Shoulder   Shoulder Exercises: ROM/Strengthening   UBE (Upper Arm Bike) x 5 min. level 1 (practicing using pt's own hand wrap w/ cues to assist hold Rt hand on bike)   Neurological Re-education Exercises   Scapular Stabilization Prone;Seated   Other Exercises 1 Pt performing wt bearing over RUE seated to squat position for scapula stabilization and proximal stabily. Prone: scapula retraction to increase proximal strength/scapula stabalization   Other Exercises 2 AA/ROM w/ UE Ranger RUE in shoulder flexion          OT Education - 08/25/14 1012    Education provided Yes   Education Details RUE wt bearing and scapula retraction HEP   Person(s) Educated Patient   Methods Explanation;Demonstration;Handout   Comprehension Verbalized understanding;Returned demonstration              Plan - 08/25/14 1029    Clinical Impression Statement Pt tolerating weight bearing ex and scapula retraction prone well. Pt progressing w/ RUE    Plan Velcro roller to simulate resisted sup/pron. (in prep for playing instrument), continue hand coordination and strength, STG's due by 09/04/14                               Problem List Patient Active Problem List   Diagnosis Date Noted  . Spastic hemiparesis affecting dominant side 07/07/2014  . Right leg swelling 06/09/2014  . Left shoulder pain 03/16/2014  . Chest pressure 02/03/2014  . Spastic hemiplegia affecting dominant side 11/07/2013  . Medicare annual wellness visit, subsequent 11/06/2013  . ED (erectile dysfunction) 02/05/2013  . Depression   . Palpitations 06/26/2012  . HLD (hyperlipidemia)   . Restless leg syndrome   . GERD (gastroesophageal reflux disease)   . BPH with obstruction/lower urinary tract symptoms   . CVA (cerebrovascular accident) 03/11/2012  . Right hemiparesis 03/11/2012  . Type 2 diabetes,  controlled, with neuropathy 03/11/2012  . OSA (obstructive sleep apnea) 03/11/2012  . CKD (chronic kidney disease) stage 3, GFR 30-59 ml/min 03/11/2012  . Migraines 03/11/2012    Redmond Baseman, OTR/L 08/25/2014 10:33 AM Phone [phone] FAX [phone]

## 2014-08-27 ENCOUNTER — Ambulatory Visit: Payer: Medicare Other

## 2014-08-27 ENCOUNTER — Encounter: Payer: Self-pay | Admitting: Family Medicine

## 2014-08-27 DIAGNOSIS — R269 Unspecified abnormalities of gait and mobility: Secondary | ICD-10-CM

## 2014-08-27 DIAGNOSIS — IMO0002 Reserved for concepts with insufficient information to code with codable children: Secondary | ICD-10-CM

## 2014-08-27 NOTE — Therapy (Signed)
[facility] [address] [place], [name], [address] Phone: [phone]   Fax:  [phone]  Physical Therapy Treatment  Patient Details  Name: James Bell MRN: [id number] Date of Birth: 1931/06/29  Encounter Date: 08/27/2014      PT End of Session - 08/27/14 0936    Visit Number 9   Number of Visits 17   Authorization Type G-code required every 10th visit for Medicare.   PT Start Time [id number]   PT Stop Time 0927   PT Time Calculation (min) 41 min   Equipment Utilized During Treatment Gait belt   Activity Tolerance Patient tolerated treatment well   Behavior During Therapy WFL for tasks assessed/performed      Past Medical History  Diagnosis Date  . HLD (hyperlipidemia)   . Restless leg syndrome     well controlled on mirapex  . OSA on CPAP     sleep study 01/2014 - severe, CPAP at 10, previously on nuvigil (Dr. [name])  . Type 2 diabetes, controlled, with neuropathy 2011  . H/O hiatal hernia   . GERD (gastroesophageal reflux disease)     with sliding HH  . Migraines 03/11/12    now rare  . CVA (cerebral infarction) 03/07/12    slurred speech; right hemplegia, left handed - completed PT 07/2014 (17 sessions)  . Basal cell carcinoma of nose 2002    bridge of nose  . History of kidney problems 12/2010    lacerated left kidney after fall  . BPH with obstruction/lower urinary tract symptoms     failed flomax, uroxatral, myrbetriq - sees urology [name]) pending videourodynamics  . Depression     mild, on cymbalta per prior PCP  . Palpitations 01/2012    holter WNL, rare PAC/PVCs  . Erectile dysfunction     per urology (prostaglandin injection)  . Kidney cysts 02/2014    bilateral (renal US)  . CKD (chronic kidney disease) stage 3, GFR 30-59 ml/min 03/11/2012    Renal US 02/2014 - multiple kidney cysts   . TIA (transient ischemic attack)   . Anemia     Past Surgical History  Procedure Laterality Date  .  Prostate surgery  02/2009    PVP (laser)  . Tonsillectomy and adenoidectomy  ~ 1945  . Hemorrhoid surgery  1991  . Cataract extraction w/ intraocular lens implant  11//2012 - 08/2011  . Inguinal hernia repair  1981; 2002    left, then repaired  . Skin cancer excision  2002    bridge of nose, BCC  . Colonoscopy  05/2011    normal per pt (Dr. [name], [place], [name])  . Mri brain  04/2010    WNL  . Esophagogastroduodenoscopy  2006    duodenitis, medual HH, Grade 2 reflux, mild gastritis  . Colonoscopy  03/2011    poor prep, unable to biopsy polyp in tic fold, rpt 3 mo [name])  . Abi  2007    WNL  . Urethral dilation  2014    tannenbaum  . Cystoscopy with urethral dilatation N/A 07/24/2013    Procedure: CYSTOSCOPY WITH URETHRAL DILATATION;  Surgeon: [name], MD;  Location: [facility];  Service: Urology;  Laterality: N/A;  . Transurethral incision of prostate N/A 07/24/2013    Procedure: TRANSURETHRAL INCISION OF THE PROSTATE (TUIP);  Surgeon: [name], MD;  Location: [facility];  Service: Urology;  Laterality: N/A;  . Esophagogastroduodenoscopy  07/2011    mild chronic gastritis  .  Colonoscopy  06/2011    mult polyps adenomatous, severe diverticulosis, rpt 3 yrs [name])  . Carotid endarterectomy    . Cardiac catheterization  2004    normal; [facility]  . Cardiovascular stress test  02/2014    WNL, low risk scan, EF 68%    There were no vitals taken for this visit.  Visit Diagnosis:  Abnormality of gait  Weakness due to cerebrovascular accident  Lack of coordination due to stroke      Subjective Assessment - 08/27/14 0848    Symptoms Pt reported he's a little unsteady today, which may be due to standing for a prolonged period of time while baking an apple pie. Pt denied falls since last visit.   Currently in Pain? No/denies      Therex: Standing: with B UE support, performed B LE. Pt independent in therex, demonstrated proper technique. -hip ext x10 -hip  abd x10 -heel raises 2x10 Supine: -bridges 2 x10 -B LE SLRs x10.  Sidelying:  -B LE Clamshells x10 Instructed pt to add ankle weights to standing therex and SLRs, and yellow theraband to clamshells to incr. Difficulty and improve strength, as pt reported therex is now "too easy".  Gait: Staci Righter (orthotist) from [employer] present during gait to fit for AFO. Pt ambulated with quad cane, 230' over even terrain with R [place] AFO and 1/4" heel wedge. All with supervision. VC's to correct narrow BOS during turns. Ronalee Belts took measurements for compression hose and AFO. Pt experienes intermittent R LE edema and plastic AFO does not allow for LE volume changes.       PT Education - 08/27/14 0935    Education provided Yes   Education Details Review strengthening HEP and instructed pt to use ankle weights and theraband as described below.   Person(s) Educated Patient   Methods Explanation;Demonstration   Comprehension Verbalized understanding;Returned demonstration          PT Short Term Goals - 08/27/14 0941    PT SHORT TERM GOAL #1   Title Pt will be independent in HEP to improve strength and functional mobility. (08/25/14)   Status On-going   PT SHORT TERM GOAL #2   Title Pt will be improve BERG score to >/=40/56 to decrease falls risk. (08/25/14)   Baseline 46/56 on 08/25/14.   Status Achieved   PT SHORT TERM GOAL #3   Title Pt will perform TUG, with LRAD, in </=13 seconds to decrease falls risk. (08/25/14)   Baseline 13.97 sec. on 08/25/14.   Status On-going   PT SHORT TERM GOAL #4   Title Pt will improve gait speed to >/= 2.74ft/sec., with LRAD, to improve functional mobility. (08/25/14)   Baseline 3.37ft/sec. on 08/25/14.   Status Achieved   PT SHORT TERM GOAL #5   Title Pt will ambulate 400' with LRAD, over even/uneven terrain, with supervision to improve functional mobility. (08/25/14)   Status Achieved          PT Long Term Goals - 08/27/14 0941    PT LONG TERM GOAL #1    Title Pt will be able to verbalize understanding of falls prevention strategies to reduce risk of falls. (09/22/14)   Status On-going   PT LONG TERM GOAL #2   Title Pt will improve BERG score to >/=49/56 to decrease falls risk. (09/22/14)   Baseline Revised from 44/56, as pt scored 46/56 on 08/25/14.   Status Revised   PT LONG TERM GOAL #3   Title Pt will improve gait  speed, with LRAD, to >/=3.65ft/sec. to improve functional mobility. (09/22/14)   Status On-going   PT LONG TERM GOAL #4   Title Pt will be able to ambulate >/= 15 minutes without back pain. (09/22/14)   Status On-going   PT LONG TERM GOAL #5   Title Pt will be able to ambulate 600', with LRAD, over even/uneven terrain at MOD I level to improve functional mobility. (09/22/14)   Status On-going   PT LONG TERM GOAL #6   Title Pt will improve SIS-mobility score by 10% to improve quality of life. (09/22/14)   Status On-going          Plan - 08/27/14 0937    Clinical Impression Statement Pt demonstrated progress as he was independent in strengthening HEP, and PT encouraged pt to incr. resistance by adding weights and theraband as noted in therex section. Staci Righter present during gait portion of session and PT gave him MD prescription for AFO and compression stockings. Pt would continue to benefit from skilled therapy to improve safety during functional mobility.   Pt will benefit from skilled therapeutic intervention in order to improve on the following deficits Abnormal gait;Decreased endurance;Decreased balance;Decreased mobility;Decreased strength;Decreased knowledge of use of DME;Decreased range of motion;Impaired flexibility;Impaired UE functional use   Rehab Potential Good   PT Frequency 2x / week   PT Duration 8 weeks   PT Treatment/Interventions ADLs/Self Care Home Management;DME Instruction;Balance training;Manual techniques;Therapeutic exercise;Therapeutic activities;Patient/family education;Functional mobility training;Stair  training;Gait training;Neuromuscular re-education   PT Next Visit Plan G-code. Assess remaining HEP exercises and progress as tolerated. Practice floor transfers and dynamic gait training.   Consulted and Agree with Plan of Care Patient                               Problem List Patient Active Problem List   Diagnosis Date Noted  . Spastic hemiparesis affecting dominant side 07/07/2014  . Right leg swelling 06/09/2014  . Left shoulder pain 03/16/2014  . Chest pressure 02/03/2014  . Spastic hemiplegia affecting dominant side 11/07/2013  . Medicare annual wellness visit, subsequent 11/06/2013  . Erectile dysfunction due to arterial insufficiency 02/05/2013  . Depression   . Palpitations 06/26/2012  . HLD (hyperlipidemia)   . Restless leg syndrome   . GERD (gastroesophageal reflux disease)   . BPH with obstruction/lower urinary tract symptoms   . CVA (cerebrovascular accident) 03/11/2012  . Right hemiparesis 03/11/2012  . Type 2 diabetes, controlled, with neuropathy 03/11/2012  . OSA (obstructive sleep apnea) 03/11/2012  . CKD (chronic kidney disease) stage 3, GFR 30-59 ml/min 03/11/2012  . Migraines 03/11/2012    Audry Pecina L 08/27/2014, 9:43 AM  Geoffry Paradise, PT,DPT 08/27/2014 9:45 AM Phone: [phone] Fax: [phone]

## 2014-09-01 ENCOUNTER — Encounter: Payer: Self-pay | Admitting: Occupational Therapy

## 2014-09-01 ENCOUNTER — Ambulatory Visit: Payer: Medicare Other | Admitting: Occupational Therapy

## 2014-09-01 DIAGNOSIS — R609 Edema, unspecified: Secondary | ICD-10-CM

## 2014-09-01 DIAGNOSIS — R269 Unspecified abnormalities of gait and mobility: Secondary | ICD-10-CM | POA: Diagnosis not present

## 2014-09-01 DIAGNOSIS — IMO0002 Reserved for concepts with insufficient information to code with codable children: Secondary | ICD-10-CM

## 2014-09-01 NOTE — Therapy (Signed)
[facility] [address] [place], [name], [address] Phone: [phone]   Fax:  [phone]  Occupational Therapy Treatment  Patient Details  Name: James Bell MRN: [id number] Date of Birth: January 07, 1931  Encounter Date: 09/01/2014      OT End of Session - 09/01/14 1309    Visit Number 5  5/10 G   Number of Visits 17   Date for OT Re-Evaluation 10/02/14   Authorization Type MCR TRAD, TRICARE   OT Start Time 0930   OT Stop Time 1015   OT Time Calculation (min) 45 min   Activity Tolerance Patient tolerated treatment well      Past Medical History  Diagnosis Date  . HLD (hyperlipidemia)   . Restless leg syndrome     well controlled on mirapex  . OSA on CPAP     sleep study 01/2014 - severe, CPAP at 10, previously on nuvigil (Dr. [name])  . Type 2 diabetes, controlled, with neuropathy 2011  . H/O hiatal hernia   . GERD (gastroesophageal reflux disease)     with sliding HH  . Migraines 03/11/12    now rare  . CVA (cerebral infarction) 03/07/12    slurred speech; right hemplegia, left handed - completed PT 07/2014 (17 sessions)  . Basal cell carcinoma of nose 2002    bridge of nose  . History of kidney problems 12/2010    lacerated left kidney after fall  . BPH with obstruction/lower urinary tract symptoms     failed flomax, uroxatral, myrbetriq - sees urology [name]) pending videourodynamics  . Depression     mild, on cymbalta per prior PCP  . Palpitations 01/2012    holter WNL, rare PAC/PVCs  . Erectile dysfunction     per urology (prostaglandin injection)  . Kidney cysts 02/2014    bilateral (renal US)  . CKD (chronic kidney disease) stage 3, GFR 30-59 ml/min 03/11/2012    Renal US 02/2014 - multiple kidney cysts   . TIA (transient ischemic attack)   . Anemia     Past Surgical History  Procedure Laterality Date  . Prostate surgery  02/2009    PVP (laser)  . Tonsillectomy and adenoidectomy  ~ 1945   . Hemorrhoid surgery  1991  . Cataract extraction w/ intraocular lens implant  11//2012 - 08/2011  . Inguinal hernia repair  1981; 2002    left, then repaired  . Skin cancer excision  2002    bridge of nose, BCC  . Colonoscopy  05/2011    normal per pt (Dr. [name], [place], [name])  . Mri brain  04/2010    WNL  . Esophagogastroduodenoscopy  2006    duodenitis, medual HH, Grade 2 reflux, mild gastritis  . Colonoscopy  03/2011    poor prep, unable to biopsy polyp in tic fold, rpt 3 mo [name])  . Abi  2007    WNL  . Urethral dilation  2014    tannenbaum  . Cystoscopy with urethral dilatation N/A 07/24/2013    Procedure: CYSTOSCOPY WITH URETHRAL DILATATION;  Surgeon: [name], MD;  Location: [facility];  Service: Urology;  Laterality: N/A;  . Transurethral incision of prostate N/A 07/24/2013    Procedure: TRANSURETHRAL INCISION OF THE PROSTATE (TUIP);  Surgeon: [name], MD;  Location: [facility];  Service: Urology;  Laterality: N/A;  . Esophagogastroduodenoscopy  07/2011    mild chronic gastritis  . Colonoscopy  06/2011    mult polyps adenomatous, severe diverticulosis,  rpt 3 yrs [name])  . Carotid endarterectomy    . Cardiac catheterization  2004    normal; [facility]  . Cardiovascular stress test  02/2014    WNL, low risk scan, EF 68%    There were no vitals taken for this visit.  Visit Diagnosis:  Lack of coordination due to stroke  Weakness due to cerebrovascular accident  Edema      Subjective Assessment - 09/01/14 0931    Symptoms "I didn't sleep well last night and just feeling bad. The exercises you gave me are helping" (scapula retraction and wt bearing). Pt reports he has not fallen anymore.    Patient Stated Goals Pt "I would love to play the ukelle again" increase grip/grasp RUE.   Currently in Pain? No/denies            OT Treatments/Exercises (OP) - 09/01/14 0940    ADLs   ADL Comments Reviewed/assessed STG's and progress to date    Shoulder Exercises: Supine   Other Supine Exercises Scapula retraction prone x 20 reps for scapula strengthening and stabalization   Shoulder Exercises: ROM/Strengthening   UBE (Upper Arm Bike) x 8 min. Level 1 for reciprocal movement pattern   Hand Exercises   Other Hand Exercises velcro roller to facilitate supination/pronation w/ resistance in prep for potentially playing instrument (per pt's goal). Pt performed w/ mod difficulty and fatigue/rest breaks.    Neurological Re-education Exercises   Scapular Stabilization Seated  quadraped, and standing   Other Exercises 1 Quadraped: A-P rocks placing weight on BUE's, then progressed to disengaging LUE placing more weight on RUE. Seated: wt. bearing on entire Rt side going from sit to squat position. Standing: wt.bearing BUE's for modified push ups w/ min support RUE.    Weight Bearing Technique   Weight Bearing Technique Yes            OT Short Term Goals - 09/01/14 0945    OT SHORT TERM GOAL #1   Title Pt will be I home program   Status Achieved   OT SHORT TERM GOAL #2   Title Pt will demonstrate increased ability to perform gross grasp R hand inpreparation for increased I ADL's/homemaking tasks   Status Achieved  w/ modifications and compensatory strategies   OT SHORT TERM GOAL #3   Title Pt will demonstrate increased AROM R hand as evidenced by ability to perform flat fist in preparation for increased functional use RUE   Status On-going   OT SHORT TERM GOAL #4   Title Pt will independently state edema control techniques   Status Achieved          OT Long Term Goals - 09/01/14 1311    OT LONG TERM GOAL #1   Title Pt will demonstrate increased independence with ADLhomemaking as evidenced by his ability to use R & LUE's to remove small dish from oven   Baseline Initially using forearm as groos assist/to balance objects and LUE as dominant hand   Status On-going   OT LONG TERM GOAL #2   Title Pt will be I with upgraded HEP  RUE   Status On-going   OT LONG TERM GOAL #3   Title Pt will demonstrate increased ability to perform grasp release as seen by improvement RUE Box and Blocks score by 10 or more blocks   Baseline Initial assessment RUE = 10 blocks   Status On-going   OT LONG TERM GOAL #4   Title Pt will reports increased functional  use RUE as functional assist for ADL's, homemaking tasks 80% of the time or greater.   Status On-going          Plan - 09/01/14 1311    Clinical Impression Statement Pt met all STG's except #3. Pt unable to make full fist   Plan Velcro roller, continue wt bearing quadraped and modified push ups at table, continue hand coordination and strength                               Problem List Patient Active Problem List   Diagnosis Date Noted  . Spastic hemiparesis affecting dominant side 07/07/2014  . Right leg swelling 06/09/2014  . Left shoulder pain 03/16/2014  . Chest pressure 02/03/2014  . Spastic hemiplegia affecting dominant side 11/07/2013  . Medicare annual wellness visit, subsequent 11/06/2013  . Erectile dysfunction due to arterial insufficiency 02/05/2013  . Depression   . Palpitations 06/26/2012  . HLD (hyperlipidemia)   . Restless leg syndrome   . GERD (gastroesophageal reflux disease)   . BPH with obstruction/lower urinary tract symptoms   . CVA (cerebrovascular accident) 03/11/2012  . Right hemiparesis 03/11/2012  . Type 2 diabetes, controlled, with neuropathy 03/11/2012  . OSA (obstructive sleep apnea) 03/11/2012  . CKD (chronic kidney disease) stage 3, GFR 30-59 ml/min 03/11/2012  . Migraines 03/11/2012    Carey Bullocks 09/01/2014, 1:15 PM  Redmond Baseman, OTR/L 09/01/2014 1:17 PM Phone [phone] FAX ([phone]

## 2014-09-03 ENCOUNTER — Ambulatory Visit: Payer: Medicare Other | Admitting: Occupational Therapy

## 2014-09-03 ENCOUNTER — Ambulatory Visit: Payer: Medicare Other

## 2014-09-03 DIAGNOSIS — R269 Unspecified abnormalities of gait and mobility: Secondary | ICD-10-CM

## 2014-09-03 DIAGNOSIS — IMO0002 Reserved for concepts with insufficient information to code with codable children: Secondary | ICD-10-CM

## 2014-09-03 NOTE — Patient Instructions (Signed)
Perform all balance activities in the corner with a chair in front of you for safety. Stop the feet apart with closed and perform feet together with eyes closed. Stop head turns with feet together, and perform head turns with feet together on a pillow. Perform single leg stance with one hand for support vs. Two hands.  Feet Together (Compliant Surface) Head Motion - Eyes Closed   Stand on compliant surface: _pillow_______ with feet together. Close eyes and move head slowly, up and down, and side to side for 30 seconds. Repeat _3___ times per session. Do __1__ sessions per day.  Copyright  VHI. All rights reserved.  Feet Together, Varied Arm Positions - Eyes Closed   Stand with feet together and arms at your side side. Close eyes and visualize upright position. Hold _30___ seconds. Repeat __3__ times per session. Do __1__ sessions per day.  Copyright  VHI. All rights reserved.  Single Leg - Eyes Open   Holding support, lift right leg while maintaining balance over other leg. Progress to removing hands from support surface for longer periods of time. Hold_10-30___ seconds. Repeat with left leg lifted. Repeat _3___ times per session. Do __1__ sessions per day.  Copyright  VHI. All rights reserved.

## 2014-09-03 NOTE — Therapy (Signed)
[facility] [address] [place], [name], [address] Phone: [phone]   Fax:  [phone]  Occupational Therapy Treatment  Patient Details  Name: James Bell MRN: [id number] Date of Birth: 15-Sep-1931  Encounter Date: 09/03/2014      OT End of Session - 09/03/14 1047    Visit Number 6  6/10 G   Number of Visits 17   Date for OT Re-Evaluation 10/02/14   Authorization Type MCR TRAD, TRICARE - G code needed!   OT Start Time [phone]   OT Stop Time 1043   OT Time Calculation (min) 53 min   Activity Tolerance Patient tolerated treatment well      Past Medical History  Diagnosis Date  . HLD (hyperlipidemia)   . Restless leg syndrome     well controlled on mirapex  . OSA on CPAP     sleep study 01/2014 - severe, CPAP at 10, previously on nuvigil (Dr. [name])  . Type 2 diabetes, controlled, with neuropathy 2011  . H/O hiatal hernia   . GERD (gastroesophageal reflux disease)     with sliding HH  . Migraines 03/11/12    now rare  . CVA (cerebral infarction) 03/07/12    slurred speech; right hemplegia, left handed - completed PT 07/2014 (17 sessions)  . Basal cell carcinoma of nose 2002    bridge of nose  . History of kidney problems 12/2010    lacerated left kidney after fall  . BPH with obstruction/lower urinary tract symptoms     failed flomax, uroxatral, myrbetriq - sees urology [name]) pending videourodynamics  . Depression     mild, on cymbalta per prior PCP  . Palpitations 01/2012    holter WNL, rare PAC/PVCs  . Erectile dysfunction     per urology (prostaglandin injection)  . Kidney cysts 02/2014    bilateral (renal US)  . CKD (chronic kidney disease) stage 3, GFR 30-59 ml/min 03/11/2012    Renal US 02/2014 - multiple kidney cysts   . TIA (transient ischemic attack)   . Anemia     Past Surgical History  Procedure Laterality Date  . Prostate surgery  02/2009    PVP (laser)  . Tonsillectomy and  adenoidectomy  ~ 1945  . Hemorrhoid surgery  1991  . Cataract extraction w/ intraocular lens implant  11//2012 - 08/2011  . Inguinal hernia repair  1981; 2002    left, then repaired  . Skin cancer excision  2002    bridge of nose, BCC  . Colonoscopy  05/2011    normal per pt (Dr. [name], [place], [name])  . Mri brain  04/2010    WNL  . Esophagogastroduodenoscopy  2006    duodenitis, medual HH, Grade 2 reflux, mild gastritis  . Colonoscopy  03/2011    poor prep, unable to biopsy polyp in tic fold, rpt 3 mo [name])  . Abi  2007    WNL  . Urethral dilation  2014    tannenbaum  . Cystoscopy with urethral dilatation N/A 07/24/2013    Procedure: CYSTOSCOPY WITH URETHRAL DILATATION;  Surgeon: [name], MD;  Location: [facility];  Service: Urology;  Laterality: N/A;  . Transurethral incision of prostate N/A 07/24/2013    Procedure: TRANSURETHRAL INCISION OF THE PROSTATE (TUIP);  Surgeon: [name], MD;  Location: [facility];  Service: Urology;  Laterality: N/A;  . Esophagogastroduodenoscopy  07/2011    mild chronic gastritis  . Colonoscopy  06/2011    mult  polyps adenomatous, severe diverticulosis, rpt 3 yrs [name])  . Carotid endarterectomy    . Cardiac catheterization  2004    normal; [facility]  . Cardiovascular stress test  02/2014    WNL, low risk scan, EF 68%    There were no vitals taken for this visit.  Visit Diagnosis:  Lack of coordination due to stroke  Weakness due to cerebrovascular accident      Subjective Assessment - 09/03/14 0955    Symptoms "I feel like the ex's are helping" (re: wt bearing and scapula retraction)   Patient Stated Goals Pt "I would love to play the ukelle again" increase grip/grasp RUE.   Currently in Pain? No/denies            OT Treatments/Exercises (OP) - 09/03/14 1601    Shoulder Exercises: ROM/Strengthening   UBE (Upper Arm Bike) x 10 min. Level 1 for reciprocal movement pattern   Hand Exercises   Other Hand Exercises  velcro roller to facilitate supination/pronation w/ resistance in prep for potentially playing instrument (per pt's goal). Pt performed w/ mod difficulty and fatigue/rest breaks.    Other Hand Exercises attempted forearm gym for controlled movement to promote sup/pron. and wrist flex/ext.  but w/ max compensations   Neurological Re-education Exercises   Scapular Stabilization --  quadraped and seated   Other Exercises 1 Modified push ups standing at hi/low mat for scapula strengthening and sh. girdle stabalization w/ min facilitation. Quadraped: A-P tilts increasing wt over BUE's and min support provided by therapist for RUE. Progressed to disengaging LUE to increase wt. over RUE w/ min assist from therapist.    Fine Motor Coordination   Large Pegboard Pt placing large pegs in pegboard (one row) w/ max difficulty, compensations and extra time.               OT Long Term Goals - 09/03/14 1048    OT LONG TERM GOAL #1   Title Pt will demonstrate increased independence with ADLhomemaking as evidenced by his ability to use R & LUE's to remove small dish from oven   Baseline Initially using forearm as groos assist/to balance objects and LUE as dominant hand   Status On-going   OT LONG TERM GOAL #2   Title Pt will be I with upgraded HEP RUE   Status On-going   OT LONG TERM GOAL #3   Title Pt will demonstrate increased ability to perform grasp release as seen by improvement RUE Box and Blocks score by 10 or more blocks   Baseline Initial assessment RUE = 10 blocks   Status On-going   OT LONG TERM GOAL #4   Title Pt will reports increased functional use RUE as functional assist for ADL's, homemaking tasks 80% of the time or greater.   Status On-going          Plan - 09/03/14 1048    Clinical Impression Statement Pt tolerating wt. bearing ex's well during O.T. session. Pt still w/ difficulty in gross and fine motor coordination.    Plan continue Rt hand coordination and strength,  functional use RUE                               Problem List Patient Active Problem List   Diagnosis Date Noted  . Spastic hemiparesis affecting dominant side 07/07/2014  . Right leg swelling 06/09/2014  . Left shoulder pain 03/16/2014  . Chest pressure 02/03/2014  .  Spastic hemiplegia affecting dominant side 11/07/2013  . Medicare annual wellness visit, subsequent 11/06/2013  . Erectile dysfunction due to arterial insufficiency 02/05/2013  . Depression   . Palpitations 06/26/2012  . HLD (hyperlipidemia)   . Restless leg syndrome   . GERD (gastroesophageal reflux disease)   . BPH with obstruction/lower urinary tract symptoms   . CVA (cerebrovascular accident) 03/11/2012  . Right hemiparesis 03/11/2012  . Type 2 diabetes, controlled, with neuropathy 03/11/2012  . OSA (obstructive sleep apnea) 03/11/2012  . CKD (chronic kidney disease) stage 3, GFR 30-59 ml/min 03/11/2012  . Migraines 03/11/2012    Carey Bullocks 09/03/2014, 10:53 AM  Redmond Baseman, OTR/L 09/03/2014 10:54 AM Phone [phone] FAX [phone]

## 2014-09-03 NOTE — Therapy (Signed)
[facility] [address] [place], [name], [address] Phone: [phone]   Fax:  [phone]  Physical Therapy Treatment  Patient Details  Name: James Bell MRN: [id number] Date of Birth: 1931/03/01  Encounter Date: 09/03/2014      PT End of Session - 09/03/14 1142    Visit Number 10   Number of Visits 17   Authorization Type G-code required every 10th visit for Medicare.   PT Start Time 0845   PT Stop Time 0927   PT Time Calculation (min) 42 min   Equipment Utilized During Treatment Gait belt   Activity Tolerance Patient tolerated treatment well   Behavior During Therapy WFL for tasks assessed/performed      Past Medical History  Diagnosis Date  . HLD (hyperlipidemia)   . Restless leg syndrome     well controlled on mirapex  . OSA on CPAP     sleep study 01/2014 - severe, CPAP at 10, previously on nuvigil (Dr. [name])  . Type 2 diabetes, controlled, with neuropathy 2011  . H/O hiatal hernia   . GERD (gastroesophageal reflux disease)     with sliding HH  . Migraines 03/11/12    now rare  . CVA (cerebral infarction) 03/07/12    slurred speech; right hemplegia, left handed - completed PT 07/2014 (17 sessions)  . Basal cell carcinoma of nose 2002    bridge of nose  . History of kidney problems 12/2010    lacerated left kidney after fall  . BPH with obstruction/lower urinary tract symptoms     failed flomax, uroxatral, myrbetriq - sees urology [name]) pending videourodynamics  . Depression     mild, on cymbalta per prior PCP  . Palpitations 01/2012    holter WNL, rare PAC/PVCs  . Erectile dysfunction     per urology (prostaglandin injection)  . Kidney cysts 02/2014    bilateral (renal US)  . CKD (chronic kidney disease) stage 3, GFR 30-59 ml/min 03/11/2012    Renal US 02/2014 - multiple kidney cysts   . TIA (transient ischemic attack)   . Anemia     Past Surgical History  Procedure Laterality Date  .  Prostate surgery  02/2009    PVP (laser)  . Tonsillectomy and adenoidectomy  ~ 1945  . Hemorrhoid surgery  1991  . Cataract extraction w/ intraocular lens implant  11//2012 - 08/2011  . Inguinal hernia repair  1981; 2002    left, then repaired  . Skin cancer excision  2002    bridge of nose, BCC  . Colonoscopy  05/2011    normal per pt (Dr. [name], [place], [name])  . Mri brain  04/2010    WNL  . Esophagogastroduodenoscopy  2006    duodenitis, medual HH, Grade 2 reflux, mild gastritis  . Colonoscopy  03/2011    poor prep, unable to biopsy polyp in tic fold, rpt 3 mo [name])  . Abi  2007    WNL  . Urethral dilation  2014    tannenbaum  . Cystoscopy with urethral dilatation N/A 07/24/2013    Procedure: CYSTOSCOPY WITH URETHRAL DILATATION;  Surgeon: [name], MD;  Location: [facility];  Service: Urology;  Laterality: N/A;  . Transurethral incision of prostate N/A 07/24/2013    Procedure: TRANSURETHRAL INCISION OF THE PROSTATE (TUIP);  Surgeon: [name], MD;  Location: [facility];  Service: Urology;  Laterality: N/A;  . Esophagogastroduodenoscopy  07/2011    mild chronic gastritis  .  Colonoscopy  06/2011    mult polyps adenomatous, severe diverticulosis, rpt 3 yrs [name])  . Carotid endarterectomy    . Cardiac catheterization  2004    normal; [facility]  . Cardiovascular stress test  02/2014    WNL, low risk scan, EF 68%    There were no vitals taken for this visit.  Visit Diagnosis:  Abnormality of gait  Weakness due to cerebrovascular accident  Lack of coordination due to stroke      Subjective Assessment - 09/03/14 0849    Symptoms Pt reported he's not sleeping well. Pt denied any falls since last visit.   Patient Stated Goals To build strength in his legs and increase his ambulation distance without quad cane if possible.            [facility] Adult PT Treatment/Exercise - 09/03/14 0849    Ambulation/Gait   Ambulation/Gait Yes   Ambulation/Gait  Assistance 5: Supervision   Ambulation/Gait Assistance Details Pt ambulated over even/uneven (ramp/curb) with R AFO donned, while performing head turns. VC's to improve upright posture and stride length. Pt noted to ambulate with improved BOS (wide vs. narrow) around turns. pt required 2 seated rest breaks due to fatigue.   Ambulation Distance (Feet) 500 Feet   Assistive device Small based quad cane   Gait Pattern Step-through pattern;Decreased step length - left;Decreased stance time - right;Decreased dorsiflexion - right;Decreased dorsiflexion - left;Trendelenburg;Trunk flexed   Gait velocity 3.79f/sec.  10MWT: 9.32 sec. with quad cane   Balance   Balance Assessed Yes   Static Standing Balance   Static Standing - Balance Support No upper extremity supported   Static Standing - Level of Assistance 5: Stand by assistance   Static Standing - Comment/# of Minutes BLE, 2x30second holds/activity. Performed in corner with chair in front of pt for safety: feet apart with eyes closed, feet together with head turns on compliant/non-compliant surface, single leg stance with one UE for support, feet together with eyes closed.   Standardized Balance Assessment   Standardized Balance Assessment Timed Up and Go Test   Timed Up and Go Test   TUG Normal TUG   Normal TUG (seconds) 13.73  with quad cane          PT Education - 09/03/14 1141    Education provided Yes   Education Details Progressed balance HEP.   Person(s) Educated Patient   Methods Explanation;Demonstration;Verbal cues;Handout   Comprehension Verbalized understanding;Returned demonstration;Verbal cues required          PT Short Term Goals - 09/03/14 1143    PT SHORT TERM GOAL #1   Title Pt will be independent in HEP to improve strength and functional mobility. (08/25/14)   Status Achieved   PT SHORT TERM GOAL #2   Title Pt will be improve BERG score to >/=40/56 to decrease falls risk. (08/25/14)   Baseline 46/56 on 08/25/14.    Status Achieved   PT SHORT TERM GOAL #3   Title Pt will perform TUG, with LRAD, in </=13 seconds to decrease falls risk. (08/25/14)   Baseline 13.97 sec. on 08/25/14.   Status On-going   PT SHORT TERM GOAL #4   Title Pt will improve gait speed to >/= 2.771fsec., with LRAD, to improve functional mobility. (08/25/14)   Baseline 3.0922fec. on 08/25/14.   Status Achieved   PT SHORT TERM GOAL #5   Title Pt will ambulate 400' with LRAD, over even/uneven terrain, with supervision to improve functional mobility. (08/25/14)   Status Achieved  PT Long Term Goals - 09-05-2014 1143    PT LONG TERM GOAL #1   Title Pt will be able to verbalize understanding of falls prevention strategies to reduce risk of falls. (09/22/14)   Status On-going   PT LONG TERM GOAL #2   Title Pt will improve BERG score to >/=49/56 to decrease falls risk. (09/22/14)   Baseline Revised from 44/56, as pt scored 46/56 on 08/25/14.   Status On-going   PT LONG TERM GOAL #3   Title Pt will improve gait speed, with LRAD, to >/=3.33f/sec. to improve functional mobility. (09/22/14)   Status On-going   PT LONG TERM GOAL #4   Title Pt will be able to ambulate >/= 15 minutes without back pain. (09/22/14)   Status On-going   PT LONG TERM GOAL #5   Title Pt will be able to ambulate 600', with LRAD, over even/uneven terrain at MOD I level to improve functional mobility. (09/22/14)   Status On-going   PT LONG TERM GOAL #6   Title Pt will improve SIS-mobility score by 10% to improve quality of life. (09/22/14)   Status On-going          Plan - [id number]    Clinical Impression Statement Pt demonstrated progress, as he was able to progress balance HEP and met HEP STG. Pt also improved gait speed and TUG time. Continue with POC.   Pt will benefit from skilled therapeutic intervention in order to improve on the following deficits Abnormal gait;Decreased endurance;Decreased balance;Decreased mobility;Decreased strength;Decreased  knowledge of use of DME;Decreased range of motion;Impaired flexibility;Impaired UE functional use   Rehab Potential Good   PT Frequency 2x / week   PT Duration 8 weeks   PT Treatment/Interventions ADLs/Self Care Home Management;DME Instruction;Balance training;Manual techniques;Therapeutic exercise;Therapeutic activities;Patient/family education;Functional mobility training;Stair training;Gait training;Neuromuscular re-education   PT Next Visit Plan Practice floor transfers and dynamic gait training.   Consulted and Agree with Plan of Care Patient          G-Codes - 1December 19, [id number]    Functional Assessment Tool Used TUG with quad cane: 13.73 sec.; BERG: 46/56; gait velcoity: 3.555fsec. with quad cane   Functional Limitation Mobility: Walking and moving around   Mobility: Walking and Moving Around Current Status ([id number] least 20 percent but less than 40 percent impaired, limited or restricted   Mobility: Walking and Moving Around Goal Status ([id number] least 1 percent but less than 20 percent impaired, limited or restricted                             Problem List Patient Active Problem List   Diagnosis Date Noted  . Spastic hemiparesis affecting dominant side 07/07/2014  . Right leg swelling 06/09/2014  . Left shoulder pain 03/16/2014  . Chest pressure 02/03/2014  . Spastic hemiplegia affecting dominant side 11/07/2013  . Medicare annual wellness visit, subsequent 11/06/2013  . Erectile dysfunction due to arterial insufficiency 02/05/2013  . Depression   . Palpitations 06/26/2012  . HLD (hyperlipidemia)   . Restless leg syndrome   . GERD (gastroesophageal reflux disease)   . BPH with obstruction/lower urinary tract symptoms   . CVA (cerebrovascular accident) 03/11/2012  . Right hemiparesis 03/11/2012  . Type 2 diabetes, controlled, with neuropathy 03/11/2012  . OSA (obstructive sleep apnea) 03/11/2012  . CKD (chronic kidney disease) stage 3, GFR 30-59  ml/min 03/11/2012  . Migraines 03/11/2012    Jameca Chumley L 1212/19/1511:46 AM  Geoffry Paradise, PT,DPT 09/03/2014 11:46 AM Phone: [phone] Fax: [phone]

## 2014-09-07 ENCOUNTER — Encounter: Payer: Self-pay | Admitting: Occupational Therapy

## 2014-09-07 ENCOUNTER — Ambulatory Visit: Payer: Medicare Other

## 2014-09-07 ENCOUNTER — Ambulatory Visit: Payer: Medicare Other | Admitting: Occupational Therapy

## 2014-09-07 DIAGNOSIS — IMO0002 Reserved for concepts with insufficient information to code with codable children: Secondary | ICD-10-CM

## 2014-09-07 DIAGNOSIS — M6289 Other specified disorders of muscle: Secondary | ICD-10-CM

## 2014-09-07 DIAGNOSIS — R269 Unspecified abnormalities of gait and mobility: Secondary | ICD-10-CM | POA: Diagnosis not present

## 2014-09-07 NOTE — Therapy (Signed)
[facility] [address] [place], [name], [address] Phone: [phone]   Fax:  [phone]  Physical Therapy Treatment  Patient Details  Name: James Bell MRN: [id number] Date of Birth: 1931-03-17  Encounter Date: 09/07/2014      PT End of Session - 09/07/14 0941    Visit Number 11   Number of Visits 17   Authorization Type G-code required every 10th visit for Medicare.   PT Start Time [phone]   PT Stop Time 0929   PT Time Calculation (min) 42 min   Equipment Utilized During Treatment Gait belt   Activity Tolerance Patient tolerated treatment well   Behavior During Therapy WFL for tasks assessed/performed      Past Medical History  Diagnosis Date  . HLD (hyperlipidemia)   . Restless leg syndrome     well controlled on mirapex  . OSA on CPAP     sleep study 01/2014 - severe, CPAP at 10, previously on nuvigil (Dr. [name])  . Type 2 diabetes, controlled, with neuropathy 2011  . H/O hiatal hernia   . GERD (gastroesophageal reflux disease)     with sliding HH  . Migraines 03/11/12    now rare  . CVA (cerebral infarction) 03/07/12    slurred speech; right hemplegia, left handed - completed PT 07/2014 (17 sessions)  . Basal cell carcinoma of nose 2002    bridge of nose  . History of kidney problems 12/2010    lacerated left kidney after fall  . BPH with obstruction/lower urinary tract symptoms     failed flomax, uroxatral, myrbetriq - sees urology [name]) pending videourodynamics  . Depression     mild, on cymbalta per prior PCP  . Palpitations 01/2012    holter WNL, rare PAC/PVCs  . Erectile dysfunction     per urology (prostaglandin injection)  . Kidney cysts 02/2014    bilateral (renal US)  . CKD (chronic kidney disease) stage 3, GFR 30-59 ml/min 03/11/2012    Renal US 02/2014 - multiple kidney cysts   . TIA (transient ischemic attack)   . Anemia     Past Surgical History  Procedure  Laterality Date  . Prostate surgery  02/2009    PVP (laser)  . Tonsillectomy and adenoidectomy  ~ 1945  . Hemorrhoid surgery  1991  . Cataract extraction w/ intraocular lens implant  11//2012 - 08/2011  . Inguinal hernia repair  1981; 2002    left, then repaired  . Skin cancer excision  2002    bridge of nose, BCC  . Colonoscopy  05/2011    normal per pt (Dr. [name], [place], [name])  . Mri brain  04/2010    WNL  . Esophagogastroduodenoscopy  2006    duodenitis, medual HH, Grade 2 reflux, mild gastritis  . Colonoscopy  03/2011    poor prep, unable to biopsy polyp in tic fold, rpt 3 mo [name])  . Abi  2007    WNL  . Urethral dilation  2014    tannenbaum  . Cystoscopy with urethral dilatation N/A 07/24/2013    Procedure: CYSTOSCOPY WITH URETHRAL DILATATION;  Surgeon: [name], MD;  Location: [facility];  Service: Urology;  Laterality: N/A;  . Transurethral incision of prostate N/A 07/24/2013    Procedure: TRANSURETHRAL INCISION OF THE PROSTATE (TUIP);  Surgeon: [name], MD;  Location: [facility];  Service: Urology;  Laterality: N/A;  . Esophagogastroduodenoscopy  07/2011    mild chronic gastritis  .  Colonoscopy  06/2011    mult polyps adenomatous, severe diverticulosis, rpt 3 yrs [name])  . Carotid endarterectomy    . Cardiac catheterization  2004    normal; [facility]  . Cardiovascular stress test  02/2014    WNL, low risk scan, EF 68%    There were no vitals taken for this visit.  Visit Diagnosis:  Abnormality of gait  Weakness due to cerebrovascular accident  Lack of coordination due to stroke      Subjective Assessment - 09/07/14 0848    Symptoms Pt denied falls or changes since last visit. Pt reported he is sleeping "so-so".    Patient Stated Goals To build strength in his legs and increase his ambulation distance without quad cane if possible.   Currently in Pain? Yes   Pain Score 2    Pain Location Back   Pain Orientation Lower;Right   Pain  Descriptors / Indicators Aching   Pain Type Chronic pain   Pain Onset In the past 7 days   Pain Frequency Intermittent   Aggravating Factors  standing for prolonged periods of time   Pain Relieving Factors rest                    [facility] Adult PT Treatment/Exercise - 09/07/14 2130    Ambulation/Gait   Ambulation/Gait Yes   Ambulation/Gait Assistance 5: Supervision;Other (comment)  min guard   Ambulation/Gait Assistance Details Pt ambulated over even terrain while performing head turns, figure eights, and 180 degree turns all with quad cane. Pt ambulated over compliant surfaces with min guard while performing figure eights and ant/post ambulation with quad cane. Pt ambulated without quad cane over even terrain, VC's to improve L UE swing with R LE advancement and to improve narrow BOS.  Pt experienced 2 LOB episodes but was able to self correct with stepping strategy.   Ambulation Distance (Feet) --  75', 4x7'/activity over mat, 230' with qc, 345' no AD   Assistive device Small based quad cane   Gait Pattern Step-through pattern;Decreased step length - left;Decreased stance time - right;Decreased dorsiflexion - right;Decreased dorsiflexion - left;Trendelenburg;Trunk flexed   Exercises   Exercises Knee/Hip;Lumbar   Lumbar Exercises: Stretches   Lower Trunk Rotation 3 reps;30 seconds   Lower Trunk Rotation Limitations PT performed B LTR stretch, VC's to relax.   Knee/Hip Exercises: Stretches   Active Hamstring Stretch 3 reps;30 seconds   Active Hamstring Stretch Limitations Pt demonstrated proper technique.     Therex: Hooklying position -TrA activation with 5 second holds x10. VC's to improve technique, pt was able to performing correctly with cue to "act as though you're trying to stop the flow of urine". -TrA activation with B hip marches x10. VC's to improve eccentric control.          PT Education - 09/07/14 0940    Education provided Yes   Education Details  Reviewed low back exercises and encouraged pt to perform them again to decr. back pain.   Person(s) Educated Patient   Methods Explanation;Demonstration;Verbal cues   Comprehension Verbalized understanding;Returned demonstration          PT Short Term Goals - 09/07/14 0942    PT SHORT TERM GOAL #1   Title Pt will be independent in HEP to improve strength and functional mobility. (08/25/14)   Status Achieved   PT SHORT TERM GOAL #2   Title Pt will be improve BERG score to >/=40/56 to decrease falls risk. (08/25/14)   Baseline  46/56 on 08/25/14.   Status Achieved   PT SHORT TERM GOAL #3   Title Pt will perform TUG, with LRAD, in </=13 seconds to decrease falls risk. (08/25/14)   Baseline 13.97 sec. on 08/25/14.   Status On-going   PT SHORT TERM GOAL #4   Title Pt will improve gait speed to >/= 2.44ft/sec., with LRAD, to improve functional mobility. (08/25/14)   Baseline 3.32ft/sec. on 08/25/14.   Status Achieved   PT SHORT TERM GOAL #5   Title Pt will ambulate 400' with LRAD, over even/uneven terrain, with supervision to improve functional mobility. (08/25/14)   Status Achieved           PT Long Term Goals - 09/07/14 9179    PT LONG TERM GOAL #1   Title Pt will be able to verbalize understanding of falls prevention strategies to reduce risk of falls. (09/22/14)   Status On-going   PT LONG TERM GOAL #2   Title Pt will improve BERG score to >/=49/56 to decrease falls risk. (09/22/14)   Baseline Revised from 44/56, as pt scored 46/56 on 08/25/14.   Status On-going   PT LONG TERM GOAL #3   Title Pt will improve gait speed, with LRAD, to >/=3.65ft/sec. to improve functional mobility. (09/22/14)   Status On-going   PT LONG TERM GOAL #4   Title Pt will be able to ambulate >/= 15 minutes without back pain. (09/22/14)   Status On-going   PT LONG TERM GOAL #5   Title Pt will be able to ambulate 600', with LRAD, over even/uneven terrain at MOD I level to improve functional mobility. (09/22/14)    Status On-going   PT LONG TERM GOAL #6   Title Pt will improve SIS-mobility score by 10% to improve quality of life. (09/22/14)   Status On-going               Plan - 09/07/14 0941    Clinical Impression Statement Pt demonstrated progress, as he was able to ambulate without quad cane and was able to self correct LOB episodes. PT responded well to low back exercises, as he reported back pain decreased to 1/10 after exercises. Continue with POC.   Pt will benefit from skilled therapeutic intervention in order to improve on the following deficits Abnormal gait;Decreased endurance;Decreased balance;Decreased mobility;Decreased strength;Decreased knowledge of use of DME;Decreased range of motion;Impaired flexibility;Impaired UE functional use   Rehab Potential Good   PT Frequency 2x / week   PT Duration 8 weeks   PT Treatment/Interventions ADLs/Self Care Home Management;DME Instruction;Balance training;Manual techniques;Therapeutic exercise;Therapeutic activities;Patient/family education;Functional mobility training;Stair training;Gait training;Neuromuscular re-education   PT Next Visit Plan Practice floor transfers and dynamic gait training without quad cane.   Consulted and Agree with Plan of Care Patient        Problem List Patient Active Problem List   Diagnosis Date Noted  . Spastic hemiparesis affecting dominant side 07/07/2014  . Right leg swelling 06/09/2014  . Left shoulder pain 03/16/2014  . Chest pressure 02/03/2014  . Spastic hemiplegia affecting dominant side 11/07/2013  . Medicare annual wellness visit, subsequent 11/06/2013  . Erectile dysfunction due to arterial insufficiency 02/05/2013  . Depression   . Palpitations 06/26/2012  . HLD (hyperlipidemia)   . Restless leg syndrome   . GERD (gastroesophageal reflux disease)   . BPH with obstruction/lower urinary tract symptoms   . CVA (cerebrovascular accident) 03/11/2012  . Right hemiparesis 03/11/2012  . Type 2  diabetes, controlled, with neuropathy 03/11/2012  .  OSA (obstructive sleep apnea) 03/11/2012  . CKD (chronic kidney disease) stage 3, GFR 30-59 ml/min 03/11/2012  . Migraines 03/11/2012    Blessing Zaucha L 09/07/2014, 9:47 AM  [facility] [address] [place], [name], [address] Phone: [phone]   Fax:  [phone]   Geoffry Paradise, PT,DPT 09/07/2014 9:47 AM Phone: [phone] Fax: [phone]

## 2014-09-07 NOTE — Therapy (Signed)
[facility] [address] [place], [name], [address] Phone: [phone]   Fax:  [phone]  Occupational Therapy Treatment  Patient Details  Name: James Bell MRN: [id number] Date of Birth: [birth year]  Encounter Date: 09/07/2014      OT End of Session - 09/07/14 1030    Visit Number 7  7/10 G   Number of Visits 17   Date for OT Re-Evaluation 10/02/14   Authorization Type MCR TRAD, [employer]ICARE - G code needed!   OT Start Time [phone]   OT Stop Time 1017   OT Time Calculation (min) 38 min   Activity Tolerance Patient tolerated treatment well      Past Medical History  Diagnosis Date  . HLD (hyperlipidemia)   . Restless leg syndrome     well controlled on mirapex  . OSA on CPAP     sleep study 01/2014 - severe, CPAP at 10, previously on nuvigil (Dr. [name])  . Type 2 diabetes, controlled, with neuropathy 2011  . H/O hiatal hernia   . GERD (gastroesophageal reflux disease)     with sliding HH  . Migraines 03/11/12    now rare  . CVA (cerebral infarction) 03/07/12    slurred speech; right hemplegia, left handed - completed PT 07/2014 (17 sessions)  . Basal cell carcinoma of nose 2002    bridge of nose  . History of kidney problems 12/2010    lacerated left kidney after fall  . BPH with obstruction/lower urinary tract symptoms     failed flomax, uroxatral, myrbetriq - sees urology [name]) pending videourodynamics  . Depression     mild, on cymbalta per prior PCP  . Palpitations 01/2012    holter WNL, rare PAC/PVCs  . Erectile dysfunction     per urology (prostaglandin injection)  . Kidney cysts 02/2014    bilateral (renal US)  . CKD (chronic kidney disease) stage 3, GFR 30-59 ml/min 03/11/2012    Renal US 02/2014 - multiple kidney cysts   . TIA (transient ischemic attack)   . Anemia     Past Surgical History  Procedure Laterality Date  . Prostate surgery  02/2009    PVP (laser)  .  Tonsillectomy and adenoidectomy  ~ 1945  . Hemorrhoid surgery  1991  . Cataract extraction w/ intraocular lens implant  11//2012 - 08/2011  . Inguinal hernia repair  1981; 2002    left, then repaired  . Skin cancer excision  2002    bridge of nose, BCC  . Colonoscopy  05/2011    normal per pt (Dr. [name], [place], [name])  . Mri brain  04/2010    WNL  . Esophagogastroduodenoscopy  2006    duodenitis, medual HH, Grade 2 reflux, mild gastritis  . Colonoscopy  03/2011    poor prep, unable to biopsy polyp in tic fold, rpt 3 mo [name])  . Abi  2007    WNL  . Urethral dilation  2014    tannenbaum  . Cystoscopy with urethral dilatation N/A 07/24/2013    Procedure: CYSTOSCOPY WITH URETHRAL DILATATION;  Surgeon: [name], MD;  Location: [facility];  Service: Urology;  Laterality: N/A;  . Transurethral incision of prostate N/A 07/24/2013    Procedure: TRANSURETHRAL INCISION OF THE PROSTATE (TUIP);  Surgeon: [name], MD;  Location: [facility];  Service: Urology;  Laterality: N/A;  . Esophagogastroduodenoscopy  07/2011    mild chronic gastritis  . Colonoscopy  06/2011  mult polyps adenomatous, severe diverticulosis, rpt 3 yrs [name])  . Carotid endarterectomy    . Cardiac catheterization  2004    normal; [facility]  . Cardiovascular stress test  02/2014    WNL, low risk scan, EF 68%    There were no vitals taken for this visit.  Visit Diagnosis:  Weakness due to cerebrovascular accident  Lack of coordination due to stroke  Hypertonia      Subjective Assessment - 09/07/14 0936    Symptoms "I'm weak in closing my hand"   Currently in Pain? No/denies                 OT Treatments/Exercises (OP) - 09/07/14 0001    Exercises   Exercises Elbow   Shoulder Exercises: Supine   Flexion AAROM;10 reps  with PVC frame x2 reps with min v.c.   Shoulder Exercises: Seated   Flexion AAROM;15 reps  UE ranger at low-mid range with min cues   Other Seated Exercises  Low-level functional raching to grasp/release cylinder objects with mod difficulty for grasp/release and elbow extension   Other Seated Exercises Flipping large cards for finger extension/supination with mod-max difficulty.  Pt fatigued quickly.   Elbow Exercises   Elbow Extension AAROM;Supine;10 reps  with PVC frame with min v.c. x2 sets   Neurological Re-education Exercises   Other Exercises 1 Weightbearing in modified quadraped for with forward/backward weight shifts with min facilitation.  Then, with LUE reaching in modified quadraped for body on arm movements and increased wt. bearing.  Seated wt bearing through RUE on mat in abduction with body on arm movements/LUE reaching.                  OT Short Term Goals - 09/01/14 0945    OT SHORT TERM GOAL #1   Title Pt will be I home program   Status Achieved   OT SHORT TERM GOAL #2   Title Pt will demonstrate increased ability to perform gross grasp R hand inpreparation for increased I ADL's/homemaking tasks   Status Achieved  w/ modifications and compensatory strategies   OT SHORT TERM GOAL #3   Title Pt will demonstrate increased AROM R hand as evidenced by ability to perform flat fist in preparation for increased functional use RUE   Status On-going   OT SHORT TERM GOAL #4   Title Pt will independently state edema control techniques   Status Achieved           OT Long Term Goals - 09/03/14 1048    OT LONG TERM GOAL #1   Title Pt will demonstrate increased independence with ADLhomemaking as evidenced by his ability to use R & LUE's to remove small dish from oven   Baseline Initially using forearm as groos assist/to balance objects and LUE as dominant hand   Status On-going   OT LONG TERM GOAL #2   Title Pt will be I with upgraded HEP RUE   Status On-going   OT LONG TERM GOAL #3   Title Pt will demonstrate increased ability to perform grasp release as seen by improvement RUE Box and Blocks score by 10 or more blocks    Baseline Initial assessment RUE = 10 blocks   Status On-going   OT LONG TERM GOAL #4   Title Pt will reports increased functional use RUE as functional assist for ADL's, homemaking tasks 80% of the time or greater.   Status On-going  Plan - 09/07/14 1031    Clinical Impression Statement Pt demo good isolated joint movement, but continues to demo difficulty with combined, functional movements and fatigues quickly.   Plan RUE functional use, neuro re-ed        Problem List Patient Active Problem List   Diagnosis Date Noted  . Spastic hemiparesis affecting dominant side 07/07/2014  . Right leg swelling 06/09/2014  . Left shoulder pain 03/16/2014  . Chest pressure 02/03/2014  . Spastic hemiplegia affecting dominant side 11/07/2013  . Medicare annual wellness visit, subsequent 11/06/2013  . Erectile dysfunction due to arterial insufficiency 02/05/2013  . Depression   . Palpitations 06/26/2012  . HLD (hyperlipidemia)   . Restless leg syndrome   . GERD (gastroesophageal reflux disease)   . BPH with obstruction/lower urinary tract symptoms   . CVA (cerebrovascular accident) 03/11/2012  . Right hemiparesis 03/11/2012  . Type 2 diabetes, controlled, with neuropathy 03/11/2012  . OSA (obstructive sleep apnea) 03/11/2012  . CKD (chronic kidney disease) stage 3, GFR 30-59 ml/min 03/11/2012  . Migraines 03/11/2012    Rayansh Herbst, OTR/L 09/07/2014, 10:33 AM  [facility] [address] [place], [name], [address] Phone: [phone]   Fax:  [phone]

## 2014-09-08 ENCOUNTER — Encounter: Payer: Medicare Other | Admitting: Occupational Therapy

## 2014-09-09 ENCOUNTER — Ambulatory Visit: Payer: Medicare Other

## 2014-09-15 ENCOUNTER — Encounter: Payer: Self-pay | Admitting: Occupational Therapy

## 2014-09-15 ENCOUNTER — Ambulatory Visit: Payer: Medicare Other

## 2014-09-15 ENCOUNTER — Ambulatory Visit: Payer: Medicare Other | Admitting: Occupational Therapy

## 2014-09-15 DIAGNOSIS — R279 Unspecified lack of coordination: Secondary | ICD-10-CM

## 2014-09-15 DIAGNOSIS — R269 Unspecified abnormalities of gait and mobility: Secondary | ICD-10-CM | POA: Diagnosis not present

## 2014-09-15 DIAGNOSIS — IMO0002 Reserved for concepts with insufficient information to code with codable children: Secondary | ICD-10-CM

## 2014-09-15 DIAGNOSIS — M6289 Other specified disorders of muscle: Secondary | ICD-10-CM

## 2014-09-15 DIAGNOSIS — R278 Other lack of coordination: Secondary | ICD-10-CM

## 2014-09-15 NOTE — Therapy (Signed)
[facility] [address] [place], [name], [address] Phone: [phone]   Fax:  [phone]  Occupational Therapy Treatment  Patient Details  Name: James Bell MRN: [id number] Date of Birth: 02-01-1931  Encounter Date: 09/15/2014      OT End of Session - 09/15/14 1218    Visit Number 8   Number of Visits 17   Date for OT Re-Evaluation 10/06/14   Authorization Type MCR TRAD, TRICARE - G code needed!   Authorization - Visit Number 8   Authorization - Number of Visits 10   OT Start Time [phone]   OT Stop Time 1015   OT Time Calculation (min) 41 min   Activity Tolerance Patient tolerated treatment well      Past Medical History  Diagnosis Date  . HLD (hyperlipidemia)   . Restless leg syndrome     well controlled on mirapex  . OSA on CPAP     sleep study 01/2014 - severe, CPAP at 10, previously on nuvigil (Dr. [name])  . Type 2 diabetes, controlled, with neuropathy 2011  . H/O hiatal hernia   . GERD (gastroesophageal reflux disease)     with sliding HH  . Migraines 03/11/12    now rare  . CVA (cerebral infarction) 03/07/12    slurred speech; right hemplegia, left handed - completed PT 07/2014 (17 sessions)  . Basal cell carcinoma of nose 2002    bridge of nose  . History of kidney problems 12/2010    lacerated left kidney after fall  . BPH with obstruction/lower urinary tract symptoms     failed flomax, uroxatral, myrbetriq - sees urology [name]) pending videourodynamics  . Depression     mild, on cymbalta per prior PCP  . Palpitations 01/2012    holter WNL, rare PAC/PVCs  . Erectile dysfunction     per urology (prostaglandin injection)  . Kidney cysts 02/2014    bilateral (renal US)  . CKD (chronic kidney disease) stage 3, GFR 30-59 ml/min 03/11/2012    Renal US 02/2014 - multiple kidney cysts   . TIA (transient ischemic attack)   . Anemia     Past Surgical History  Procedure Laterality  Date  . Prostate surgery  02/2009    PVP (laser)  . Tonsillectomy and adenoidectomy  ~ 1945  . Hemorrhoid surgery  1991  . Cataract extraction w/ intraocular lens implant  11//2012 - 08/2011  . Inguinal hernia repair  1981; 2002    left, then repaired  . Skin cancer excision  2002    bridge of nose, BCC  . Colonoscopy  05/2011    normal per pt (Dr. [name], [place], [name])  . Mri brain  04/2010    WNL  . Esophagogastroduodenoscopy  2006    duodenitis, medual HH, Grade 2 reflux, mild gastritis  . Colonoscopy  03/2011    poor prep, unable to biopsy polyp in tic fold, rpt 3 mo [name])  . Abi  2007    WNL  . Urethral dilation  2014    tannenbaum  . Cystoscopy with urethral dilatation N/A 07/24/2013    Procedure: CYSTOSCOPY WITH URETHRAL DILATATION;  Surgeon: [name], MD;  Location: [facility];  Service: Urology;  Laterality: N/A;  . Transurethral incision of prostate N/A 07/24/2013    Procedure: TRANSURETHRAL INCISION OF THE PROSTATE (TUIP);  Surgeon: [name], MD;  Location: [facility];  Service: Urology;  Laterality: N/A;  . Esophagogastroduodenoscopy  07/2011  mild chronic gastritis  . Colonoscopy  06/2011    mult polyps adenomatous, severe diverticulosis, rpt 3 yrs [name])  . Carotid endarterectomy    . Cardiac catheterization  2004    normal; [facility]  . Cardiovascular stress test  02/2014    WNL, low risk scan, EF 68%    There were no vitals taken for this visit.  Visit Diagnosis:  Weakness due to cerebrovascular accident  Lack of coordination due to stroke  Decreased coordination  Hypertonia      Subjective Assessment - 09/15/14 0935    Symptoms "I'm a little tired"  "That was a good session today."  "I can see progress."   Currently in Pain? Yes   Pain Score 2    Pain Location Back   Pain Type Chronic pain   Aggravating Factors  standing for prolonged periods   Pain Relieving Factors stretches                 OT  Treatments/Exercises (OP) - 09/15/14 0001    Shoulder Exercises: Seated   Flexion AAROM;10 reps;Right  table slides, UE ranger with min facilitation/cues   Abduction AAROM;Right;15 reps  table slides, with min-mod facilitation for full PROM   Neurological Re-education Exercises   Shoulder Flexion AAROM;Supine;Both;15 reps  with PVC frame with min facilitation   Elbow Extension AAROM;Both;15 reps;Supine  with PVC frame with min facilitation   Reciprocal Movements Arm bike x82min level 1 with R hand wrapped and min cues for elbow extension   Functional Reaching Activities   Low Level with mod facilitation for elbow extension/grasp of small cones   Weight Bearing Technique   Weight Bearing Technique Yes  sitting/standing, body on arm movement, min facilitation/cue                  OT Short Term Goals - 09/01/14 0945    OT SHORT TERM GOAL #1   Title Pt will be I home program   Status Achieved   OT SHORT TERM GOAL #2   Title Pt will demonstrate increased ability to perform gross grasp R hand inpreparation for increased I ADL's/homemaking tasks   Status Achieved  w/ modifications and compensatory strategies   OT SHORT TERM GOAL #3   Title Pt will demonstrate increased AROM R hand as evidenced by ability to perform flat fist in preparation for increased functional use RUE   Status On-going   OT SHORT TERM GOAL #4   Title Pt will independently state edema control techniques   Status Achieved           OT Long Term Goals - 09/03/14 1048    OT LONG TERM GOAL #1   Title Pt will demonstrate increased independence with ADLhomemaking as evidenced by his ability to use R & LUE's to remove small dish from oven   Baseline Initially using forearm as groos assist/to balance objects and LUE as dominant hand   Status On-going   OT LONG TERM GOAL #2   Title Pt will be I with upgraded HEP RUE   Status On-going   OT LONG TERM GOAL #3   Title Pt will demonstrate increased ability to  perform grasp release as seen by improvement RUE Box and Blocks score by 10 or more blocks   Baseline Initial assessment RUE = 10 blocks   Status On-going   OT LONG TERM GOAL #4   Title Pt will reports increased functional use RUE as functional assist for ADL's, homemaking tasks  80% of the time or greater.   Status On-going               Plan - 09/15/14 1220    Clinical Impression Statement Pt demo decreaed fatigue today with AAROM, but continues to demo difficulty with low range functional reaching.   Plan RUE functional use, neuro re-ed   Consulted and Agree with Plan of Care Patient        Problem List Patient Active Problem List   Diagnosis Date Noted  . Spastic hemiparesis affecting dominant side 07/07/2014  . Right leg swelling 06/09/2014  . Left shoulder pain 03/16/2014  . Chest pressure 02/03/2014  . Spastic hemiplegia affecting dominant side 11/07/2013  . Medicare annual wellness visit, subsequent 11/06/2013  . Erectile dysfunction due to arterial insufficiency 02/05/2013  . Depression   . Palpitations 06/26/2012  . HLD (hyperlipidemia)   . Restless leg syndrome   . GERD (gastroesophageal reflux disease)   . BPH with obstruction/lower urinary tract symptoms   . CVA (cerebrovascular accident) 03/11/2012  . Right hemiparesis 03/11/2012  . Type 2 diabetes, controlled, with neuropathy 03/11/2012  . OSA (obstructive sleep apnea) 03/11/2012  . CKD (chronic kidney disease) stage 3, GFR 30-59 ml/min 03/11/2012  . Migraines 03/11/2012    Gudelia Eugene, OTR/L 09/15/2014, 12:26 PM  [facility] [address] [place], [name], [address] Phone: [phone]   Fax:  [phone]

## 2014-09-15 NOTE — Therapy (Signed)
[facility] [address] [place], [name], [address] Phone: [phone]   Fax:  [phone]  Physical Therapy Treatment  Patient Details  Name: James Bell MRN: [id number] Date of Birth: 1931-09-13  Encounter Date: 09/15/2014      PT End of Session - 09/15/14 0940    Visit Number 12   Number of Visits 17   Date for PT Re-Evaluation 09/26/14   Authorization Type G-code required every 10th visit for Medicare.   PT Start Time [phone]   PT Stop Time 0928   PT Time Calculation (min) 42 min   Equipment Utilized During Treatment Gait belt   Activity Tolerance Patient tolerated treatment well   Behavior During Therapy WFL for tasks assessed/performed      Past Medical History  Diagnosis Date  . HLD (hyperlipidemia)   . Restless leg syndrome     well controlled on mirapex  . OSA on CPAP     sleep study 01/2014 - severe, CPAP at 10, previously on nuvigil (Dr. [name])  . Type 2 diabetes, controlled, with neuropathy 2011  . H/O hiatal hernia   . GERD (gastroesophageal reflux disease)     with sliding HH  . Migraines 03/11/12    now rare  . CVA (cerebral infarction) 03/07/12    slurred speech; right hemplegia, left handed - completed PT 07/2014 (17 sessions)  . Basal cell carcinoma of nose 2002    bridge of nose  . History of kidney problems 12/2010    lacerated left kidney after fall  . BPH with obstruction/lower urinary tract symptoms     failed flomax, uroxatral, myrbetriq - sees urology [name]) pending videourodynamics  . Depression     mild, on cymbalta per prior PCP  . Palpitations 01/2012    holter WNL, rare PAC/PVCs  . Erectile dysfunction     per urology (prostaglandin injection)  . Kidney cysts 02/2014    bilateral (renal US)  . CKD (chronic kidney disease) stage 3, GFR 30-59 ml/min 03/11/2012    Renal US 02/2014 - multiple kidney cysts   . TIA (transient ischemic attack)   . Anemia      Past Surgical History  Procedure Laterality Date  . Prostate surgery  02/2009    PVP (laser)  . Tonsillectomy and adenoidectomy  ~ 1945  . Hemorrhoid surgery  1991  . Cataract extraction w/ intraocular lens implant  11//2012 - 08/2011  . Inguinal hernia repair  1981; 2002    left, then repaired  . Skin cancer excision  2002    bridge of nose, BCC  . Colonoscopy  05/2011    normal per pt (Dr. [name], [place], [name])  . Mri brain  04/2010    WNL  . Esophagogastroduodenoscopy  2006    duodenitis, medual HH, Grade 2 reflux, mild gastritis  . Colonoscopy  03/2011    poor prep, unable to biopsy polyp in tic fold, rpt 3 mo [name])  . Abi  2007    WNL  . Urethral dilation  2014    tannenbaum  . Cystoscopy with urethral dilatation N/A 07/24/2013    Procedure: CYSTOSCOPY WITH URETHRAL DILATATION;  Surgeon: [name], MD;  Location: [facility];  Service: Urology;  Laterality: N/A;  . Transurethral incision of prostate N/A 07/24/2013    Procedure: TRANSURETHRAL INCISION OF THE PROSTATE (TUIP);  Surgeon: [name], MD;  Location: [facility];  Service: Urology;  Laterality: N/A;  . Esophagogastroduodenoscopy  07/2011    mild chronic gastritis  . Colonoscopy  06/2011    mult polyps adenomatous, severe diverticulosis, rpt 3 yrs [name])  . Carotid endarterectomy    . Cardiac catheterization  2004    normal; [facility]  . Cardiovascular stress test  02/2014    WNL, low risk scan, EF 68%    There were no vitals taken for this visit.  Visit Diagnosis:  Abnormality of gait  Weakness due to cerebrovascular accident  Lack of coordination due to stroke      Subjective Assessment - 09/15/14 0848    Symptoms Pt denied falls or changes since last visit. Pt reported he did not sleep well last night, as he had food poisoning and vomited 3-4 times last night. Pt says he feels better now, and denied having fever. Pt reported he has not stretched/exercised much this last weekend, so  that's why he believes his back pain is increased.   Patient Stated Goals To build strength in his legs and increase his ambulation distance without quad cane if possible.   Currently in Pain? Yes   Pain Score 3    Pain Location Back   Pain Orientation Right;Lower   Pain Type Chronic pain   Pain Onset More than a month ago   Pain Frequency Constant   Pain Relieving Factors stretches                    [facility] Adult PT Treatment/Exercise - 09/15/14 6962    Ambulation/Gait   Ambulation/Gait Yes   Ambulation/Gait Assistance 5: Supervision;4: Min guard   Ambulation/Gait Assistance Details Pt ambulated over even/uneven terrain, while performing head turns, with and without quad cane. Pt also ambulated on treadmill, as he wished to trial here to see if he could safely use treadmill at home. Back pain decreased to 0/10 after ambulation. VC's to improve R heel strike and decrease narrow BOS. Pt required seated rest break after each bout of ambulaton on treadmill.    Ambulation Distance (Feet) --  300', treadmill for 10 minutes (.21 miles at 1.3-1.35mph), 120', 75'    Assistive device Small based quad cane;None   Gait Pattern Step-through pattern;Decreased step length - left;Decreased stance time - right;Decreased dorsiflexion - right;Decreased dorsiflexion - left;Trendelenburg;Trunk flexed  occasional narrow BOS   Ramp Other (comment)  min guard   Ramp Details (indicate cue type and reason) Pt ascended/descended the ramp with quad cane and min guard to ensure safety. VC's to lean posteriorly while descending ramp as pt has tendency to lean forward.                PT Education - 09/15/14 [phone]    Education provided Yes   Education Details PT educated pt on the importance of taking breaks when he uses treadmill at [employer], PT also encouraged pt to someone with him at the gym.   Person(s) Educated Patient   Methods Explanation   Comprehension Verbalized understanding           PT Short Term Goals - 09/07/14 0942    PT SHORT TERM GOAL #1   Title Pt will be independent in HEP to improve strength and functional mobility. (08/25/14)   Status Achieved   PT SHORT TERM GOAL #2   Title Pt will be improve BERG score to >/=40/56 to decrease falls risk. (08/25/14)   Baseline 46/56 on 08/25/14.   Status Achieved   PT SHORT TERM GOAL #3   Title  Pt will perform TUG, with LRAD, in </=13 seconds to decrease falls risk. (08/25/14)   Baseline 13.97 sec. on 08/25/14.   Status On-going   PT SHORT TERM GOAL #4   Title Pt will improve gait speed to >/= 2.26ft/sec., with LRAD, to improve functional mobility. (08/25/14)   Baseline 3.49ft/sec. on 08/25/14.   Status Achieved   PT SHORT TERM GOAL #5   Title Pt will ambulate 400' with LRAD, over even/uneven terrain, with supervision to improve functional mobility. (08/25/14)   Status Achieved           PT Long Term Goals - 09/07/14 7619    PT LONG TERM GOAL #1   Title Pt will be able to verbalize understanding of falls prevention strategies to reduce risk of falls. (09/22/14)   Status On-going   PT LONG TERM GOAL #2   Title Pt will improve BERG score to >/=49/56 to decrease falls risk. (09/22/14)   Baseline Revised from 44/56, as pt scored 46/56 on 08/25/14.   Status On-going   PT LONG TERM GOAL #3   Title Pt will improve gait speed, with LRAD, to >/=3.83ft/sec. to improve functional mobility. (09/22/14)   Status On-going   PT LONG TERM GOAL #4   Title Pt will be able to ambulate >/= 15 minutes without back pain. (09/22/14)   Status On-going   PT LONG TERM GOAL #5   Title Pt will be able to ambulate 600', with LRAD, over even/uneven terrain at MOD I level to improve functional mobility. (09/22/14)   Status On-going   PT LONG TERM GOAL #6   Title Pt will improve SIS-mobility score by 10% to improve quality of life. (09/22/14)   Status On-going               Plan - 09/15/14 0940    Clinical Impression Statement Pt  demonstrated progress, as he was able to ambulate longer distances, for a longer period of time over even/uneven terrain. Pt also reported decreased back pain to 0/10 at end of session. Continue wiht POC.   Pt will benefit from skilled therapeutic intervention in order to improve on the following deficits Abnormal gait;Decreased endurance;Decreased balance;Decreased mobility;Decreased strength;Decreased knowledge of use of DME;Decreased range of motion;Impaired flexibility;Impaired UE functional use   Rehab Potential Good   PT Frequency 2x / week   PT Duration 8 weeks   PT Treatment/Interventions ADLs/Self Care Home Management;DME Instruction;Balance training;Manual techniques;Therapeutic exercise;Therapeutic activities;Patient/family education;Functional mobility training;Stair training;Gait training;Neuromuscular re-education   PT Next Visit Plan Practice floor transfers and dynamic gait/balance training without quad cane.   Consulted and Agree with Plan of Care Patient        Problem List Patient Active Problem List   Diagnosis Date Noted  . Spastic hemiparesis affecting dominant side 07/07/2014  . Right leg swelling 06/09/2014  . Left shoulder pain 03/16/2014  . Chest pressure 02/03/2014  . Spastic hemiplegia affecting dominant side 11/07/2013  . Medicare annual wellness visit, subsequent 11/06/2013  . Erectile dysfunction due to arterial insufficiency 02/05/2013  . Depression   . Palpitations 06/26/2012  . HLD (hyperlipidemia)   . Restless leg syndrome   . GERD (gastroesophageal reflux disease)   . BPH with obstruction/lower urinary tract symptoms   . CVA (cerebrovascular accident) 03/11/2012  . Right hemiparesis 03/11/2012  . Type 2 diabetes, controlled, with neuropathy 03/11/2012  . OSA (obstructive sleep apnea) 03/11/2012  . CKD (chronic kidney disease) stage 3, GFR 30-59 ml/min 03/11/2012  . Migraines 03/11/2012  Caius Silbernagel L 09/15/2014, 9:44 AM  [facility] [address] [place], [name], [address] Phone: [phone]   Fax:  [phone]     Geoffry Paradise, PT,DPT 09/15/2014 9:44 AM Phone: [phone] Fax: [phone]

## 2014-09-17 ENCOUNTER — Ambulatory Visit: Payer: Medicare Other | Admitting: Occupational Therapy

## 2014-09-17 ENCOUNTER — Encounter: Payer: Self-pay | Admitting: Occupational Therapy

## 2014-09-17 ENCOUNTER — Ambulatory Visit: Payer: Medicare Other

## 2014-09-17 DIAGNOSIS — M6289 Other specified disorders of muscle: Secondary | ICD-10-CM

## 2014-09-17 DIAGNOSIS — IMO0002 Reserved for concepts with insufficient information to code with codable children: Secondary | ICD-10-CM

## 2014-09-17 DIAGNOSIS — R269 Unspecified abnormalities of gait and mobility: Secondary | ICD-10-CM

## 2014-09-17 NOTE — Therapy (Signed)
[name] [address] [place], [name], [address] Phone: [phone]   Fax:  [phone]  Occupational Therapy Treatment  Patient Details  Name: James Bell MRN: [id number] Date of Birth: 1931-07-07  Encounter Date: 09/17/2014      OT End of Session - 09/17/14 1152    Visit Number 9   Number of Visits 17   Date for OT Re-Evaluation 10/06/14   Authorization Type MCR TRAD, TRICARE - G code needed!   Authorization - Visit Number 9   Authorization - Number of Visits 10   OT Start Time [phone]   OT Stop Time 1015   OT Time Calculation (min) 42 min   Activity Tolerance Patient tolerated treatment well      Past Medical History  Diagnosis Date  . HLD (hyperlipidemia)   . Restless leg syndrome     well controlled on mirapex  . OSA on CPAP     sleep study 01/2014 - severe, CPAP at 10, previously on nuvigil (Dr. [name])  . Type 2 diabetes, controlled, with neuropathy 2011  . H/O hiatal hernia   . GERD (gastroesophageal reflux disease)     with sliding HH  . Migraines 03/11/12    now rare  . CVA (cerebral infarction) 03/07/12    slurred speech; right hemplegia, left handed - completed PT 07/2014 (17 sessions)  . Basal cell carcinoma of nose 2002    bridge of nose  . History of kidney problems 12/2010    lacerated left kidney after fall  . BPH with obstruction/lower urinary tract symptoms     failed flomax, uroxatral, myrbetriq - sees urology [name]) pending videourodynamics  . Depression     mild, on cymbalta per prior PCP  . Palpitations 01/2012    holter WNL, rare PAC/PVCs  . Erectile dysfunction     per urology (prostaglandin injection)  . Kidney cysts 02/2014    bilateral (renal US)  . CKD (chronic kidney disease) stage 3, GFR 30-59 ml/min 03/11/2012    Renal US 02/2014 - multiple kidney cysts   . TIA (transient ischemic attack)   . Anemia     Past Surgical History  Procedure Laterality  Date  . Prostate surgery  02/2009    PVP (laser)  . Tonsillectomy and adenoidectomy  ~ 1945  . Hemorrhoid surgery  1991  . Cataract extraction w/ intraocular lens implant  11//2012 - 08/2011  . Inguinal hernia repair  1981; 2002    left, then repaired  . Skin cancer excision  2002    bridge of nose, BCC  . Colonoscopy  05/2011    normal per pt (Dr. [name], [place], [name])  . Mri brain  04/2010    WNL  . Esophagogastroduodenoscopy  2006    duodenitis, medual HH, Grade 2 reflux, mild gastritis  . Colonoscopy  03/2011    poor prep, unable to biopsy polyp in tic fold, rpt 3 mo [name])  . Abi  2007    WNL  . Urethral dilation  2014    tannenbaum  . Cystoscopy with urethral dilatation N/A 07/24/2013    Procedure: CYSTOSCOPY WITH URETHRAL DILATATION;  Surgeon: [name], MD;  Location: [facility];  Service: Urology;  Laterality: N/A;  . Transurethral incision of prostate N/A 07/24/2013    Procedure: TRANSURETHRAL INCISION OF THE PROSTATE (TUIP);  Surgeon: [name], MD;  Location: [facility];  Service: Urology;  Laterality: N/A;  . Esophagogastroduodenoscopy  07/2011  mild chronic gastritis  . Colonoscopy  06/2011    mult polyps adenomatous, severe diverticulosis, rpt 3 yrs [name])  . Carotid endarterectomy    . Cardiac catheterization  2004    normal; [facility]  . Cardiovascular stress test  02/2014    WNL, low risk scan, EF 68%    There were no vitals taken for this visit.  Visit Diagnosis:  Weakness due to cerebrovascular accident  Lack of coordination due to stroke  Hypertonia      Subjective Assessment - 09/17/14 0936    Symptoms "I've been working on it"   Currently in Pain? No/denies                 OT Treatments/Exercises (OP) - 09/17/14 0001    Neurological Re-education Exercises   Scapular Stabilization Bilateral;Prone  wt bearing on elbows with chest/head press ups with min v.c.   Shoulder Flexion AAROM;Right;Seated;Standing  UE  ranger horizontal, hemiglide diagonal, min facilitation   Shoulder ABduction AAROM  and flexion table slides with min cues   Other Grasp and Release Exercises  In standing, removing cylinder pegs with and without velcro resistance with mod cues for normal movement patterns   Other Grasp and Release Exercises  In sitting, flipping large cards with focus/mod cues for normal movement patterns   Other Weight-Bearing Exercises 1 In standing, wt bearing with body on arm movements with min v.c.                  OT Short Term Goals - 09/01/14 0945    OT SHORT TERM GOAL #1   Title Pt will be I home program   Status Achieved   OT SHORT TERM GOAL #2   Title Pt will demonstrate increased ability to perform gross grasp R hand inpreparation for increased I ADL's/homemaking tasks   Status Achieved  w/ modifications and compensatory strategies   OT SHORT TERM GOAL #3   Title Pt will demonstrate increased AROM R hand as evidenced by ability to perform flat fist in preparation for increased functional use RUE   Status On-going   OT SHORT TERM GOAL #4   Title Pt will independently state edema control techniques   Status Achieved           OT Long Term Goals - 09/03/14 1048    OT LONG TERM GOAL #1   Title Pt will demonstrate increased independence with ADLhomemaking as evidenced by his ability to use R & LUE's to remove small dish from oven   Baseline Initially using forearm as groos assist/to balance objects and LUE as dominant hand   Status On-going   OT LONG TERM GOAL #2   Title Pt will be I with upgraded HEP RUE   Status On-going   OT LONG TERM GOAL #3   Title Pt will demonstrate increased ability to perform grasp release as seen by improvement RUE Box and Blocks score by 10 or more blocks   Baseline Initial assessment RUE = 10 blocks   Status On-going   OT LONG TERM GOAL #4   Title Pt will reports increased functional use RUE as functional assist for ADL's, homemaking tasks 80%  of the time or greater.   Status On-going               Plan - 09/17/14 1153    Clinical Impression Statement Pt demo increased activity tolerance and increased ability to perform normal movment patterns during functional tasks with cueing.  Plan RUE functional use, neuro re-ed, G code next visit        Problem List Patient Active Problem List   Diagnosis Date Noted  . Spastic hemiparesis affecting dominant side 07/07/2014  . Right leg swelling 06/09/2014  . Left shoulder pain 03/16/2014  . Chest pressure 02/03/2014  . Spastic hemiplegia affecting dominant side 11/07/2013  . Medicare annual wellness visit, subsequent 11/06/2013  . Erectile dysfunction due to arterial insufficiency 02/05/2013  . Depression   . Palpitations 06/26/2012  . HLD (hyperlipidemia)   . Restless leg syndrome   . GERD (gastroesophageal reflux disease)   . BPH with obstruction/lower urinary tract symptoms   . CVA (cerebrovascular accident) 03/11/2012  . Right hemiparesis 03/11/2012  . Type 2 diabetes, controlled, with neuropathy 03/11/2012  . OSA (obstructive sleep apnea) 03/11/2012  . CKD (chronic kidney disease) stage 3, GFR 30-59 ml/min 03/11/2012  . Migraines 03/11/2012    Yair Dusza, OTR/L 09/17/2014, 11:59 AM  [facility] [address] [place], [name], [address] Phone: [phone]   Fax:  [phone]

## 2014-09-17 NOTE — Therapy (Signed)
[facility] [address] [place], [name], [address] Phone: [phone]   Fax:  [phone]  Physical Therapy Treatment  Patient Details  Name: James Bell MRN: [id number] Date of Birth: 17-Jul-1931  Encounter Date: 09/17/2014      PT End of Session - 09/17/14 1206    Visit Number 13   Number of Visits 17   Date for PT Re-Evaluation 09/26/14   Authorization Type G-code required every 10th visit for Medicare.   PT Start Time [phone]   PT Stop Time 0928   PT Time Calculation (min) 41 min   Equipment Utilized During Treatment Gait belt   Activity Tolerance Patient tolerated treatment well   Behavior During Therapy WFL for tasks assessed/performed      Past Medical History  Diagnosis Date  . HLD (hyperlipidemia)   . Restless leg syndrome     well controlled on mirapex  . OSA on CPAP     sleep study 01/2014 - severe, CPAP at 10, previously on nuvigil (Dr. [name])  . Type 2 diabetes, controlled, with neuropathy 2011  . H/O hiatal hernia   . GERD (gastroesophageal reflux disease)     with sliding HH  . Migraines 03/11/12    now rare  . CVA (cerebral infarction) 03/07/12    slurred speech; right hemplegia, left handed - completed PT 07/2014 (17 sessions)  . Basal cell carcinoma of nose 2002    bridge of nose  . History of kidney problems 12/2010    lacerated left kidney after fall  . BPH with obstruction/lower urinary tract symptoms     failed flomax, uroxatral, myrbetriq - sees urology [name]) pending videourodynamics  . Depression     mild, on cymbalta per prior PCP  . Palpitations 01/2012    holter WNL, rare PAC/PVCs  . Erectile dysfunction     per urology (prostaglandin injection)  . Kidney cysts 02/2014    bilateral (renal US)  . CKD (chronic kidney disease) stage 3, GFR 30-59 ml/min 03/11/2012    Renal US 02/2014 - multiple kidney cysts   . TIA (transient ischemic attack)   . Anemia      Past Surgical History  Procedure Laterality Date  . Prostate surgery  02/2009    PVP (laser)  . Tonsillectomy and adenoidectomy  ~ 1945  . Hemorrhoid surgery  1991  . Cataract extraction w/ intraocular lens implant  11//2012 - 08/2011  . Inguinal hernia repair  1981; 2002    left, then repaired  . Skin cancer excision  2002    bridge of nose, BCC  . Colonoscopy  05/2011    normal per pt (Dr. [name], [place], [name])  . Mri brain  04/2010    WNL  . Esophagogastroduodenoscopy  2006    duodenitis, medual HH, Grade 2 reflux, mild gastritis  . Colonoscopy  03/2011    poor prep, unable to biopsy polyp in tic fold, rpt 3 mo [name])  . Abi  2007    WNL  . Urethral dilation  2014    tannenbaum  . Cystoscopy with urethral dilatation N/A 07/24/2013    Procedure: CYSTOSCOPY WITH URETHRAL DILATATION;  Surgeon: [name], MD;  Location: [facility];  Service: Urology;  Laterality: N/A;  . Transurethral incision of prostate N/A 07/24/2013    Procedure: TRANSURETHRAL INCISION OF THE PROSTATE (TUIP);  Surgeon: [name], MD;  Location: [facility];  Service: Urology;  Laterality: N/A;  . Esophagogastroduodenoscopy  07/2011    mild chronic gastritis  . Colonoscopy  06/2011    mult polyps adenomatous, severe diverticulosis, rpt 3 yrs [name])  . Carotid endarterectomy    . Cardiac catheterization  2004    normal; [facility]  . Cardiovascular stress test  02/2014    WNL, low risk scan, EF 68%    There were no vitals taken for this visit.  Visit Diagnosis:  Abnormality of gait  Weakness due to cerebrovascular accident  Lack of coordination due to stroke      Subjective Assessment - 09/17/14 0850    Symptoms Pt denied falls or changes since last visit. Pt reported he is still not sleeping well but has not vomited since Monday night.    Patient Stated Goals To build strength in his legs and increase his ambulation distance without quad cane if possible.   Currently in Pain?  No/denies                    [facility] Adult PT Treatment/Exercise - 09/17/14 7322    Ambulation/Gait   Ambulation/Gait Yes   Ambulation/Gait Assistance 4: Min guard;5: Supervision;4: Min assist   Ambulation/Gait Assistance Details Pt ambulated over even terrain with hurrycane (3-pronged cane) while performing turns and head turns, and was noted to be less steady with hurrycane vs. quad cane, as pt required min A during 2 LOB episodes. Pt ambulated with quad cane while performing head turns and did not experience LOB. VC's to improve narrow BOS during turns.   Ambulation Distance (Feet) --  300' with hurrycane, 117'x2 and 75'x2 with quad cane   Assistive device Small based quad cane;None;Other (Comment)  hurrycane (3-prong cane)   Gait Pattern Step-through pattern;Decreased step length - left;Decreased stance time - right;Decreased dorsiflexion - right;Decreased dorsiflexion - left;Trendelenburg;Trunk flexed   Ramp 4: Min assist;Other (comment)  min guard   Ramp Details (indicate cue type and reason) x2 ascend/descend ramp. Pt required min A during one LOB episode while descending ramp. VC's to improve weight shifting.   Balance   Balance Assessed Yes   Static Standing Balance   Static Standing - Balance Support No upper extremity supported;Left upper extremity supported   Static Standing - Level of Assistance Other (comment);4: Min assist  min guard   Static Standing - Comment/# of Minutes B LE in parallel bars while standing on compliant foam surface with 30 second holds x3 set/activity: feet apart with eyes open, with and without vertical/horizontal head turns, marches with eyes open x20. Pt required min A  during 4 LOB episodes. Pt used L UE assist during marches.   Dynamic Standing Balance   Dynamic Standing - Balance Support Left upper extremity supported;No upper extremity supported   Dynamic Standing - Level of Assistance 4: Min assist;Other (comment)  min guard   Dynamic  Standing - Balance Activities Compliant surfaces;Alternating  foot traps;Eyes closed;Eyes open;Head nods;Head turns   Dynamic Standing - Comments In parallel bars with 0-1 UE support on compliant and non-compliant sufaces (4x'7/activity): forward and lateral ambulation to improve weight shifting and foot clearance, B LE heel taps on 4" blocks (single and crossover taps), with pt requiring min A during 2 LOB episodes. VC's for technique and to improve weight shifting.                PT Education - 09/17/14 1205    Education provided Yes   Education Details PT educated pt on using quad cane at all times vs. 3-prong cane (  hurrycane) in order to ensure safety, as pt experienced LOB with 3-prong cane.   Person(s) Educated Patient   Methods Explanation   Comprehension Verbalized understanding          PT Short Term Goals - 09/07/14 0942    PT SHORT TERM GOAL #1   Title Pt will be independent in HEP to improve strength and functional mobility. (08/25/14)   Status Achieved   PT SHORT TERM GOAL #2   Title Pt will be improve BERG score to >/=40/56 to decrease falls risk. (08/25/14)   Baseline 46/56 on 08/25/14.   Status Achieved   PT SHORT TERM GOAL #3   Title Pt will perform TUG, with LRAD, in </=13 seconds to decrease falls risk. (08/25/14)   Baseline 13.97 sec. on 08/25/14.   Status On-going   PT SHORT TERM GOAL #4   Title Pt will improve gait speed to >/= 2.41ft/sec., with LRAD, to improve functional mobility. (08/25/14)   Baseline 3.80ft/sec. on 08/25/14.   Status Achieved   PT SHORT TERM GOAL #5   Title Pt will ambulate 400' with LRAD, over even/uneven terrain, with supervision to improve functional mobility. (08/25/14)   Status Achieved           PT Long Term Goals - 09/07/14 9924    PT LONG TERM GOAL #1   Title Pt will be able to verbalize understanding of falls prevention strategies to reduce risk of falls. (09/22/14)   Status On-going   PT LONG TERM GOAL #2   Title Pt will  improve BERG score to >/=49/56 to decrease falls risk. (09/22/14)   Baseline Revised from 44/56, as pt scored 46/56 on 08/25/14.   Status On-going   PT LONG TERM GOAL #3   Title Pt will improve gait speed, with LRAD, to >/=3.88ft/sec. to improve functional mobility. (09/22/14)   Status On-going   PT LONG TERM GOAL #4   Title Pt will be able to ambulate >/= 15 minutes without back pain. (09/22/14)   Status On-going   PT LONG TERM GOAL #5   Title Pt will be able to ambulate 600', with LRAD, over even/uneven terrain at MOD I level to improve functional mobility. (09/22/14)   Status On-going   PT LONG TERM GOAL #6   Title Pt will improve SIS-mobility score by 10% to improve quality of life. (09/22/14)   Status On-going               Plan - 09/17/14 1206    Clinical Impression Statement Pt demonstrated progress, as he was able to ambulate and perform dynamic balance activities on compliant surface. Pt's endurance is improving as he required less seated rest breaks today. Pt continues to experience poor R foot clearance, which will improve once pt receives new AFO. Pt would continue to benefit from PT to improve safety during functional mobility.   Pt will benefit from skilled therapeutic intervention in order to improve on the following deficits Abnormal gait;Decreased endurance;Decreased balance;Decreased mobility;Decreased strength;Decreased knowledge of use of DME;Decreased range of motion;Impaired flexibility;Impaired UE functional use   Rehab Potential Good   PT Frequency 2x / week   PT Duration 8 weeks   PT Treatment/Interventions ADLs/Self Care Home Management;DME Instruction;Balance training;Manual techniques;Therapeutic exercise;Therapeutic activities;Patient/family education;Functional mobility training;Stair training;Gait training;Neuromuscular re-education   PT Next Visit Plan Practice floor transfers and dynamic gait/balance training without quad cane.   Consulted and Agree with Plan of  Care Patient        Problem List Patient  Active Problem List   Diagnosis Date Noted  . Spastic hemiparesis affecting dominant side 07/07/2014  . Right leg swelling 06/09/2014  . Left shoulder pain 03/16/2014  . Chest pressure 02/03/2014  . Spastic hemiplegia affecting dominant side 11/07/2013  . Medicare annual wellness visit, subsequent 11/06/2013  . Erectile dysfunction due to arterial insufficiency 02/05/2013  . Depression   . Palpitations 06/26/2012  . HLD (hyperlipidemia)   . Restless leg syndrome   . GERD (gastroesophageal reflux disease)   . BPH with obstruction/lower urinary tract symptoms   . CVA (cerebrovascular accident) 03/11/2012  . Right hemiparesis 03/11/2012  . Type 2 diabetes, controlled, with neuropathy 03/11/2012  . OSA (obstructive sleep apnea) 03/11/2012  . CKD (chronic kidney disease) stage 3, GFR 30-59 ml/min 03/11/2012  . Migraines 03/11/2012    Vista Sawatzky L 09/17/2014, 12:10 PM  [facility] [address] [place], [name], [address] Phone: [phone]   Fax:  [phone]     Geoffry Paradise, PT,DPT 09/17/2014 12:10 PM Phone: [phone] Fax: [phone]

## 2014-09-18 DIAGNOSIS — H9113 Presbycusis, bilateral: Secondary | ICD-10-CM

## 2014-09-18 HISTORY — DX: Presbycusis, bilateral: H91.13

## 2014-09-23 ENCOUNTER — Ambulatory Visit: Payer: Medicare Other | Attending: Physical Medicine & Rehabilitation

## 2014-09-23 DIAGNOSIS — R609 Edema, unspecified: Secondary | ICD-10-CM | POA: Diagnosis not present

## 2014-09-23 DIAGNOSIS — R269 Unspecified abnormalities of gait and mobility: Secondary | ICD-10-CM | POA: Diagnosis not present

## 2014-09-23 DIAGNOSIS — I6980 Unspecified sequelae of other cerebrovascular disease: Secondary | ICD-10-CM | POA: Diagnosis not present

## 2014-09-23 DIAGNOSIS — I69898 Other sequelae of other cerebrovascular disease: Secondary | ICD-10-CM | POA: Diagnosis not present

## 2014-09-23 DIAGNOSIS — IMO0002 Reserved for concepts with insufficient information to code with codable children: Secondary | ICD-10-CM

## 2014-09-23 DIAGNOSIS — R531 Weakness: Secondary | ICD-10-CM | POA: Diagnosis not present

## 2014-09-23 DIAGNOSIS — R279 Unspecified lack of coordination: Secondary | ICD-10-CM | POA: Insufficient documentation

## 2014-09-23 NOTE — Therapy (Signed)
[facility] [address] [place], [name], [address] Phone: [phone]   Fax:  [phone]  Physical Therapy Treatment  Patient Details  Name: James Bell MRN: [id number] Date of Birth: 08-01-1931  Encounter Date: 09/23/2014      PT End of Session - 09/23/14 1504    Visit Number 14   Number of Visits 17   Date for PT Re-Evaluation 09/26/14   Authorization Type G-code required every 10th visit for Medicare.   PT Start Time 1316   PT Stop Time 1358   PT Time Calculation (min) 42 min   Equipment Utilized During Treatment Gait belt   Activity Tolerance Patient tolerated treatment well   Behavior During Therapy WFL for tasks assessed/performed      Past Medical History  Diagnosis Date  . HLD (hyperlipidemia)   . Restless leg syndrome     well controlled on mirapex  . OSA on CPAP     sleep study 01/2014 - severe, CPAP at 10, previously on nuvigil (Dr. [name])  . Type 2 diabetes, controlled, with neuropathy 2011  . H/O hiatal hernia   . GERD (gastroesophageal reflux disease)     with sliding HH  . Migraines 03/11/12    now rare  . CVA (cerebral infarction) 03/07/12    slurred speech; right hemplegia, left handed - completed PT 07/2014 (17 sessions)  . Basal cell carcinoma of nose 2002    bridge of nose  . History of kidney problems 12/2010    lacerated left kidney after fall  . BPH with obstruction/lower urinary tract symptoms     failed flomax, uroxatral, myrbetriq - sees urology [name]) pending videourodynamics  . Depression     mild, on cymbalta per prior PCP  . Palpitations 01/2012    holter WNL, rare PAC/PVCs  . Erectile dysfunction     per urology (prostaglandin injection)  . Kidney cysts 02/2014    bilateral (renal US)  . CKD (chronic kidney disease) stage 3, GFR 30-59 ml/min 03/11/2012    Renal US 02/2014 - multiple kidney cysts   . TIA (transient ischemic attack)   . Anemia     Past  Surgical History  Procedure Laterality Date  . Prostate surgery  02/2009    PVP (laser)  . Tonsillectomy and adenoidectomy  ~ 1945  . Hemorrhoid surgery  1991  . Cataract extraction w/ intraocular lens implant  11//2012 - 08/2011  . Inguinal hernia repair  1981; 2002    left, then repaired  . Skin cancer excision  2002    bridge of nose, BCC  . Colonoscopy  05/2011    normal per pt (Dr. [name], [place], [name])  . Mri brain  04/2010    WNL  . Esophagogastroduodenoscopy  2006    duodenitis, medual HH, Grade 2 reflux, mild gastritis  . Colonoscopy  03/2011    poor prep, unable to biopsy polyp in tic fold, rpt 3 mo [name])  . Abi  2007    WNL  . Urethral dilation  2014    tannenbaum  . Cystoscopy with urethral dilatation N/A 07/24/2013    Procedure: CYSTOSCOPY WITH URETHRAL DILATATION;  Surgeon: [name], MD;  Location: [facility];  Service: Urology;  Laterality: N/A;  . Transurethral incision of prostate N/A 07/24/2013    Procedure: TRANSURETHRAL INCISION OF THE PROSTATE (TUIP);  Surgeon: [name], MD;  Location: [facility];  Service: Urology;  Laterality: N/A;  . Esophagogastroduodenoscopy  07/2011    mild chronic gastritis  . Colonoscopy  06/2011    mult polyps adenomatous, severe diverticulosis, rpt 3 yrs [name])  . Carotid endarterectomy    . Cardiac catheterization  2004    normal; [facility]  . Cardiovascular stress test  02/2014    WNL, low risk scan, EF 68%    There were no vitals taken for this visit.  Visit Diagnosis:  Abnormality of gait  Weakness due to cerebrovascular accident  Lack of coordination due to stroke      Subjective Assessment - 09/23/14 1318    Symptoms Pt denied falls or changes since last visit.    Patient Stated Goals To build strength in his legs and increase his ambulation distance without quad cane if possible.   Currently in Pain? No/denies                    [facility] Adult PT Treatment/Exercise - 09/23/14 1324     Ambulation/Gait   Ambulation/Gait Yes   Ambulation/Gait Assistance 6: Modified independent (Device/Increase time)   Ambulation/Gait Assistance Details Pt ambulated over even/uneven terrain at MOD I level. Pt self corrected 1 LOB episode due to narrow BOS.   Ambulation Distance (Feet) --  100', 75', 650'   Assistive device Small based quad cane   Gait Pattern Step-through pattern;Decreased step length - left;Decreased stance time - right;Decreased dorsiflexion - right;Decreased dorsiflexion - left;Trendelenburg;Trunk flexed  pt's gait deviations more pronounced last 100' of amb   Gait velocity 3.70f/sec.  10MWT: 10.25sec.   Ramp 6: Modified independent (Device)   Ramp Details (indicate cue type and reason) No LOB or cues needed, pt demonstrated safe technique.   Standardized Balance Assessment   Standardized Balance Assessment Berg Balance Test;Timed Up and Go Test   Berg Balance Test   Sit to Stand Able to stand without using hands and stabilize independently   Standing Unsupported Able to stand safely 2 minutes   Sitting with Back Unsupported but Feet Supported on Floor or Stool Able to sit safely and securely 2 minutes   Stand to Sit Sits safely with minimal use of hands   Transfers Able to transfer safely, minor use of hands   Standing Unsupported with Eyes Closed Able to stand 10 seconds safely   Standing Ubsupported with Feet Together Able to place feet together independently and stand 1 minute safely   From Standing, Reach Forward with Outstretched Arm Can reach confidently >25 cm (10")  11"   From Standing Position, Pick up Object from Floor Able to pick up shoe safely and easily   From Standing Position, Turn to Look Behind Over each Shoulder Looks behind from both sides and weight shifts well   Turn 360 Degrees Able to turn 360 degrees safely but slowly   Standing Unsupported, Alternately Place Feet on Step/Stool Able to complete 4 steps without aid or supervision   Standing  Unsupported, One Foot in Front Able to plae foot ahead of the other independently and hold 30 seconds   Standing on One Leg Able to lift leg independently and hold equal to or more than 3 seconds  4 seconds.   Total Score 49   Timed Up and Go Test   TUG Normal TUG   Normal TUG (seconds) 13  with quad cane                  PT Short Term Goals - 09/23/14 1506    PT SHORT TERM GOAL #  1   Title Pt will be independent in HEP to improve strength and functional mobility. (08/25/14)   Status Achieved   PT SHORT TERM GOAL #2   Title Pt will be improve BERG score to >/=40/56 to decrease falls risk. (08/25/14)   Baseline 46/56 on 08/25/14.   Status Achieved   PT SHORT TERM GOAL #3   Title Pt will perform TUG, with LRAD, in </=13 seconds to decrease falls risk. (08/25/14)   Baseline 13.0 sec. on 10/03/14.   Status Achieved   PT SHORT TERM GOAL #4   Title Pt will improve gait speed to >/= 2.18f/sec., with LRAD, to improve functional mobility. (08/25/14)   Baseline 3.071fsec. on 08/25/14.   Status Achieved   PT SHORT TERM GOAL #5   Title Pt will ambulate 400' with LRAD, over even/uneven terrain, with supervision to improve functional mobility. (08/25/14)   Status Achieved           PT Long Term Goals - 09/23/14 1506    PT LONG TERM GOAL #1   Title Pt will be able to verbalize understanding of falls prevention strategies to reduce risk of falls. (09/22/14)   Status On-going   PT LONG TERM GOAL #2   Title Pt will improve BERG score to >/=49/56 to decrease falls risk. (09/22/14)   Baseline 49/56 on 10/03/14.   Status Achieved   PT LONG TERM GOAL #3   Title Pt will improve gait speed, with LRAD, to >/=3.1691fec. to improve functional mobility. (09/22/14)   Baseline 3.2ft62fc. on 09/23/14.   Status Achieved   PT LONG TERM GOAL #4   Title Pt will be able to ambulate >/= 15 minutes without back pain. (09/22/14)   Baseline >15 minutes without pain.   Status Achieved   PT LONG TERM GOAL #5    Title Pt will be able to ambulate 600', with LRAD, over even/uneven terrain at MOD I level to improve functional mobility. (09/22/14)   Baseline 650' with LRAD at MOD I   Status Achieved   PT LONG TERM GOAL #6   Title Pt will improve SIS-mobility score by 10% to improve quality of life. (09/22/14)   Status On-going               Plan - 09/23/14 1504    Clinical Impression Statement Pt demonstrated progress, as he met STG #3(TUG), LTGs #2,3,4,and 5. PT will assess LTG #1 and 6 next visit. Continue with POC.   Pt will benefit from skilled therapeutic intervention in order to improve on the following deficits Abnormal gait;Decreased endurance;Decreased balance;Decreased mobility;Decreased strength;Decreased knowledge of use of DME;Decreased range of motion;Impaired flexibility;Impaired UE functional use   Rehab Potential Good   PT Frequency 2x / week   PT Duration 8 weeks   PT Treatment/Interventions ADLs/Self Care Home Management;DME Instruction;Balance training;Manual techniques;Therapeutic exercise;Therapeutic activities;Patient/family education;Functional mobility training;Stair training;Gait training;Neuromuscular re-education   PT Next Visit Plan Finishing assessing LTGs, D/C and G-code.   Consulted and Agree with Plan of Care Patient        Problem List Patient Active Problem List   Diagnosis Date Noted  . Spastic hemiparesis affecting dominant side 07/07/2014  . Right leg swelling 06/09/2014  . Left shoulder pain 03/16/2014  . Chest pressure 02/03/2014  . Spastic hemiplegia affecting dominant side 11/07/2013  . Medicare annual wellness visit, subsequent 11/06/2013  . Erectile dysfunction due to arterial insufficiency 02/05/2013  . Depression   . Palpitations 06/26/2012  . HLD (hyperlipidemia)   . Restless  leg syndrome   . GERD (gastroesophageal reflux disease)   . BPH with obstruction/lower urinary tract symptoms   . CVA (cerebrovascular accident) 03/11/2012  . Right  hemiparesis 03/11/2012  . Type 2 diabetes, controlled, with neuropathy 03/11/2012  . OSA (obstructive sleep apnea) 03/11/2012  . CKD (chronic kidney disease) stage 3, GFR 30-59 ml/min 03/11/2012  . Migraines 03/11/2012    Tarnisha Kachmar L 09/23/2014, 3:08 PM  [facility] [address] [place], [name], [address] Phone: [phone]   Fax:  [phone]     Geoffry Paradise, PT,DPT 09/23/2014 3:08 PM Phone: [phone] Fax: [phone]

## 2014-09-24 ENCOUNTER — Ambulatory Visit: Payer: Medicare Other | Admitting: Occupational Therapy

## 2014-09-24 ENCOUNTER — Encounter: Payer: Self-pay | Admitting: Occupational Therapy

## 2014-09-24 ENCOUNTER — Ambulatory Visit: Payer: Medicare Other

## 2014-09-24 DIAGNOSIS — R279 Unspecified lack of coordination: Secondary | ICD-10-CM | POA: Diagnosis not present

## 2014-09-24 DIAGNOSIS — R269 Unspecified abnormalities of gait and mobility: Secondary | ICD-10-CM

## 2014-09-24 DIAGNOSIS — I6980 Unspecified sequelae of other cerebrovascular disease: Secondary | ICD-10-CM | POA: Diagnosis not present

## 2014-09-24 DIAGNOSIS — IMO0002 Reserved for concepts with insufficient information to code with codable children: Secondary | ICD-10-CM

## 2014-09-24 DIAGNOSIS — R609 Edema, unspecified: Secondary | ICD-10-CM | POA: Diagnosis not present

## 2014-09-24 DIAGNOSIS — I69898 Other sequelae of other cerebrovascular disease: Secondary | ICD-10-CM | POA: Diagnosis not present

## 2014-09-24 DIAGNOSIS — R531 Weakness: Secondary | ICD-10-CM | POA: Diagnosis not present

## 2014-09-24 NOTE — Patient Instructions (Addendum)

## 2014-09-24 NOTE — Therapy (Signed)
[facility] [address] [place], [name], [address] Phone: [phone]   Fax:  [phone]  Occupational Therapy Treatment  Patient Details  Name: James Bell MRN: [id number] Date of Birth: 16-Sep-1931 Referring Provider:  [name], MD  Encounter Date: 09/24/2014      OT End of Session - 09/24/14 0843    Visit Number 10  G code completed today   Number of Visits 17   Date for OT Re-Evaluation 10/06/14   Authorization Type MCR TRAD, TRICARE - G code needed!   OT Start Time 0800   OT Stop Time 0845   OT Time Calculation (min) 45 min   Activity Tolerance Patient tolerated treatment well      Past Medical History  Diagnosis Date  . HLD (hyperlipidemia)   . Restless leg syndrome     well controlled on mirapex  . OSA on CPAP     sleep study 01/2014 - severe, CPAP at 10, previously on nuvigil (Dr. [name])  . Type 2 diabetes, controlled, with neuropathy 2011  . H/O hiatal hernia   . GERD (gastroesophageal reflux disease)     with sliding HH  . Migraines 03/11/12    now rare  . CVA (cerebral infarction) 03/07/12    slurred speech; right hemplegia, left handed - completed PT 07/2014 (17 sessions)  . Basal cell carcinoma of nose 2002    bridge of nose  . History of kidney problems 12/2010    lacerated left kidney after fall  . BPH with obstruction/lower urinary tract symptoms     failed flomax, uroxatral, myrbetriq - sees urology [name]) pending videourodynamics  . Depression     mild, on cymbalta per prior PCP  . Palpitations 01/2012    holter WNL, rare PAC/PVCs  . Erectile dysfunction     per urology (prostaglandin injection)  . Kidney cysts 02/2014    bilateral (renal US)  . CKD (chronic kidney disease) stage 3, GFR 30-59 ml/min 03/11/2012    Renal US 02/2014 - multiple kidney cysts   . TIA (transient ischemic attack)   . Anemia     Past Surgical History  Procedure Laterality Date   . Prostate surgery  02/2009    PVP (laser)  . Tonsillectomy and adenoidectomy  ~ 1945  . Hemorrhoid surgery  1991  . Cataract extraction w/ intraocular lens implant  11//2012 - 08/2011  . Inguinal hernia repair  1981; 2002    left, then repaired  . Skin cancer excision  2002    bridge of nose, BCC  . Colonoscopy  05/2011    normal per pt (Dr. [name], [place], [name])  . Mri brain  04/2010    WNL  . Esophagogastroduodenoscopy  2006    duodenitis, medual HH, Grade 2 reflux, mild gastritis  . Colonoscopy  03/2011    poor prep, unable to biopsy polyp in tic fold, rpt 3 mo [name])  . Abi  2007    WNL  . Urethral dilation  2014    tannenbaum  . Cystoscopy with urethral dilatation N/A 07/24/2013    Procedure: CYSTOSCOPY WITH URETHRAL DILATATION;  Surgeon: [name], MD;  Location: [facility];  Service: Urology;  Laterality: N/A;  . Transurethral incision of prostate N/A 07/24/2013    Procedure: TRANSURETHRAL INCISION OF THE PROSTATE (TUIP);  Surgeon: [name], MD;  Location: [facility];  Service: Urology;  Laterality: N/A;  . Esophagogastroduodenoscopy  07/2011    mild chronic  gastritis  . Colonoscopy  06/2011    mult polyps adenomatous, severe diverticulosis, rpt 3 yrs [name])  . Carotid endarterectomy    . Cardiac catheterization  2004    normal; [facility]  . Cardiovascular stress test  02/2014    WNL, low risk scan, EF 68%    There were no vitals taken for this visit.  Visit Diagnosis:  Weakness due to cerebrovascular accident  Lack of coordination due to stroke  Edema      Subjective Assessment - 09/24/14 0805    Symptoms "I shook hands w/ someone using my Rt hand"   Patient Stated Goals Pt "I would love to play the ukelle again" increase grip/grasp RUE.   Currently in Pain? No/denies                 OT Treatments/Exercises (OP) - 09/24/14 5732    Neurological Re-education Exercises   Weight Bearing Position Standing   Other Weight-Bearing  Exercises 1 In standing, wt bearing with body on arm movements  with min v.c. while reaching w/ LUE to place connect 4 pieces into slots (placing wt over RUE)   Other Weight-Bearing Exercises 2 Modified push ups in standing w/ min support RUE X 10 reps and min cues to perform correctly   Reciprocal Movements UBE x 8 min. Rt hand wrapped for reciprocal movement pattern   Functional Reaching Activities   Low Level To place cylindrical wooden pegs in bowl w/ emphasis on elbow extension, min to mod compensations.                   OT Short Term Goals - 09/01/14 0945    OT SHORT TERM GOAL #1   Title Pt will be I home program   Status Achieved   OT SHORT TERM GOAL #2   Title Pt will demonstrate increased ability to perform gross grasp R hand inpreparation for increased I ADL's/homemaking tasks   Status Achieved  w/ modifications and compensatory strategies   OT SHORT TERM GOAL #3   Title Pt will demonstrate increased AROM R hand as evidenced by ability to perform flat fist in preparation for increased functional use RUE   Status On-going   OT SHORT TERM GOAL #4   Title Pt will independently state edema control techniques   Status Achieved           OT Long Term Goals - 09/03/14 1048    OT LONG TERM GOAL #1   Title Pt will demonstrate increased independence with ADLhomemaking as evidenced by his ability to use R & LUE's to remove small dish from oven   Baseline Initially using forearm as groos assist/to balance objects and LUE as dominant hand   Status On-going   OT LONG TERM GOAL #2   Title Pt will be I with upgraded HEP RUE   Status On-going   OT LONG TERM GOAL #3   Title Pt will demonstrate increased ability to perform grasp release as seen by improvement RUE Box and Blocks score by 10 or more blocks   Baseline Initial assessment RUE = 10 blocks   Status On-going   OT LONG TERM GOAL #4   Title Pt will reports increased functional use RUE as functional assist for ADL's,  homemaking tasks 80% of the time or greater.   Status On-going               Plan - 09/24/14 0843    Clinical Impression Statement Pt made  no improvements on Box and Blocks test, however pt reports shaking hands w/ people using Rt hand.    Plan Wt bearing quadraped, prone on elbows, velcro roller, anticipate d/c 10/06/14 (will need G-code at d/c)   Consulted and Agree with Plan of Care Patient          G-Codes - September 27, 2014 0962    Functional Assessment Tool Used Box & Blocks Rt =  9  (eval Rt= 10)   Functional Limitation Carrying, moving and handling objects   Carrying, Moving and Handling Objects Current Status (E3662) At least 60 percent but less than 80 percent impaired, limited or restricted   Carrying, Moving and Handling Objects Goal Status (H4765) At least 40 percent but less than 60 percent impaired, limited or restricted      Problem List Patient Active Problem List   Diagnosis Date Noted  . Spastic hemiparesis affecting dominant side 07/07/2014  . Right leg swelling 06/09/2014  . Left shoulder pain 03/16/2014  . Chest pressure 02/03/2014  . Spastic hemiplegia affecting dominant side 11/07/2013  . Medicare annual wellness visit, subsequent 11/06/2013  . Erectile dysfunction due to arterial insufficiency 02/05/2013  . Depression   . Palpitations 06/26/2012  . HLD (hyperlipidemia)   . Restless leg syndrome   . GERD (gastroesophageal reflux disease)   . BPH with obstruction/lower urinary tract symptoms   . CVA (cerebrovascular accident) 03/11/2012  . Right hemiparesis 03/11/2012  . Type 2 diabetes, controlled, with neuropathy 03/11/2012  . OSA (obstructive sleep apnea) 03/11/2012  . CKD (chronic kidney disease) stage 3, GFR 30-59 ml/min 03/11/2012  . Migraines 03/11/2012    Carey Bullocks, OTR/L 09-27-2014, 8:47 AM  [facility] [address] [place], [name], [address] Phone: [phone]    Fax:  [phone]

## 2014-09-24 NOTE — Therapy (Signed)
[facility] [address] [place], [name], [address] Phone: [phone]   Fax:  [phone]  Physical Therapy Treatment  Patient Details  Name: James Bell MRN: [id number] Date of Birth: 02/22/1931 Referring Provider:  [name], MD  Encounter Date: 09/24/2014      PT End of Session - 09/24/14 0912    Visit Number 15   Number of Visits 17   Date for PT Re-Evaluation 09/26/14   Authorization Type G-code required every 10th visit for Medicare.   PT Start Time 0848   PT Stop Time 0920   PT Time Calculation (min) 32 min   Activity Tolerance Patient tolerated treatment well   Behavior During Therapy [facility] for tasks assessed/performed      Past Medical History  Diagnosis Date  . HLD (hyperlipidemia)   . Restless leg syndrome     well controlled on mirapex  . OSA on CPAP     sleep study 01/2014 - severe, CPAP at 10, previously on nuvigil (Dr. [name])  . Type 2 diabetes, controlled, with neuropathy 2011  . H/O hiatal hernia   . GERD (gastroesophageal reflux disease)     with sliding HH  . Migraines 03/11/12    now rare  . CVA (cerebral infarction) 03/07/12    slurred speech; right hemplegia, left handed - completed PT 07/2014 (17 sessions)  . Basal cell carcinoma of nose 2002    bridge of nose  . History of kidney problems 12/2010    lacerated left kidney after fall  . BPH with obstruction/lower urinary tract symptoms     failed flomax, uroxatral, myrbetriq - sees urology [name]) pending videourodynamics  . Depression     mild, on cymbalta per prior PCP  . Palpitations 01/2012    holter WNL, rare PAC/PVCs  . Erectile dysfunction     per urology (prostaglandin injection)  . Kidney cysts 02/2014    bilateral (renal US)  . CKD (chronic kidney disease) stage 3, GFR 30-59 ml/min 03/11/2012    Renal US 02/2014 - multiple kidney cysts   . TIA (transient ischemic attack)   . Anemia     Past  Surgical History  Procedure Laterality Date  . Prostate surgery  02/2009    PVP (laser)  . Tonsillectomy and adenoidectomy  ~ 1945  . Hemorrhoid surgery  1991  . Cataract extraction w/ intraocular lens implant  11//2012 - 08/2011  . Inguinal hernia repair  1981; 2002    left, then repaired  . Skin cancer excision  2002    bridge of nose, BCC  . Colonoscopy  05/2011    normal per pt (Dr. [name], [place], [name])  . Mri brain  04/2010    WNL  . Esophagogastroduodenoscopy  2006    duodenitis, medual HH, Grade 2 reflux, mild gastritis  . Colonoscopy  03/2011    poor prep, unable to biopsy polyp in tic fold, rpt 3 mo [name])  . Abi  2007    WNL  . Urethral dilation  2014    tannenbaum  . Cystoscopy with urethral dilatation N/A 07/24/2013    Procedure: CYSTOSCOPY WITH URETHRAL DILATATION;  Surgeon: [name], MD;  Location: [facility];  Service: Urology;  Laterality: N/A;  . Transurethral incision of prostate N/A 07/24/2013    Procedure: TRANSURETHRAL INCISION OF THE PROSTATE (TUIP);  Surgeon: [name], MD;  Location: [facility];  Service: Urology;  Laterality: N/A;  . Esophagogastroduodenoscopy  07/2011  mild chronic gastritis  . Colonoscopy  06/2011    mult polyps adenomatous, severe diverticulosis, rpt 3 yrs ([facility])  . Carotid endarterectomy    . Cardiac catheterization  2004    normal; [facility]  . Cardiovascular stress test  02/2014    WNL, low risk scan, EF 68%    There were no vitals taken for this visit.  Visit Diagnosis:  Abnormality of gait  Weakness due to cerebrovascular accident  Lack of coordination due to stroke      Subjective Assessment - 09/24/14 0850    Symptoms Pt denied falls or changes since last visit.   Patient Stated Goals To build strength in his legs and increase his ambulation distance without quad cane if possible.   Currently in Pain? No/denies                  Self care:     PT Education - 09/24/14 0911     Education provided Yes   Education Details PT reviewed falls prevention handout with pt to reduce falls risk. PT reviewed pt's HEP and discussed how to progress therex (endurance and strength). Pt completed SIS survey.   Person(s) Educated Patient   Methods Explanation;Verbal cues;Handout   Comprehension Verbalized understanding          PT Short Term Goals - 09/23/14 1506    PT SHORT TERM GOAL #1   Title Pt will be independent in HEP to improve strength and functional mobility. (08/25/14)   Status Achieved   PT SHORT TERM GOAL #2   Title Pt will be improve BERG score to >/=40/56 to decrease falls risk. (08/25/14)   Baseline 46/56 on 08/25/14.   Status Achieved   PT SHORT TERM GOAL #3   Title Pt will perform TUG, with LRAD, in </=13 seconds to decrease falls risk. (08/25/14)   Baseline 13.0 sec. on 10/03/14.   Status Achieved   PT SHORT TERM GOAL #4   Title Pt will improve gait speed to >/= 2.75ft/sec., with LRAD, to improve functional mobility. (08/25/14)   Baseline 3.09ft/sec. on 08/25/14.   Status Achieved   PT SHORT TERM GOAL #5   Title Pt will ambulate 400' with LRAD, over even/uneven terrain, with supervision to improve functional mobility. (08/25/14)   Status Achieved           PT Long Term Goals - 09/24/14 0914    PT LONG TERM GOAL #1   Title Pt will be able to verbalize understanding of falls prevention strategies to reduce risk of falls. (09/22/14)   Status Achieved   PT LONG TERM GOAL #2   Title Pt will improve BERG score to >/=49/56 to decrease falls risk. (09/22/14)   Baseline 49/56 on 10/03/14.   Status Achieved   PT LONG TERM GOAL #3   Title Pt will improve gait speed, with LRAD, to >/=3.16ft/sec. to improve functional mobility. (09/22/14)   Baseline 3.2ft/sec. on 09/23/14.   Status Achieved   PT LONG TERM GOAL #4   Title Pt will be able to ambulate >/= 15 minutes without back pain. (09/22/14)   Baseline >15 minutes without pain.   Status Achieved   PT LONG TERM GOAL #5    Title Pt will be able to ambulate 600', with LRAD, over even/uneven terrain at MOD I level to improve functional mobility. (09/22/14)   Baseline 650' with LRAD at MOD I   Status Achieved   PT LONG TERM GOAL #6   Title Pt will improve   SIS-mobility score by 10% to improve quality of life. (09/22/14)   Baseline Score stayed the same at 36% but physical fear improved by 25 points.    Status Not Met               Plan - 09/24/14 0913    Clinical Impression Statement Pt is discharging from PT today, please see d/c summary for detail. Pt met all STGs and LTGs, except for LTG SIS goal.          G-Codes - 09/24/14 0914    Functional Assessment Tool Used TUG with quad cane: 13.0sec.; BERG: 49/56; gait velocity: 3.2ft/sec. with quad cane   Functional Limitation Mobility: Walking and moving around   Mobility: Walking and Moving Around Current Status (G8978) At least 1 percent but less than 20 percent impaired, limited or restricted   Mobility: Walking and Moving Around Goal Status (G8979) At least 1 percent but less than 20 percent impaired, limited or restricted      Problem List Patient Active Problem List   Diagnosis Date Noted  . Spastic hemiparesis affecting dominant side 07/07/2014  . Right leg swelling 06/09/2014  . Left shoulder pain 03/16/2014  . Chest pressure 02/03/2014  . Spastic hemiplegia affecting dominant side 11/07/2013  . Medicare annual wellness visit, subsequent 11/06/2013  . Erectile dysfunction due to arterial insufficiency 02/05/2013  . Depression   . Palpitations 06/26/2012  . HLD (hyperlipidemia)   . Restless leg syndrome   . GERD (gastroesophageal reflux disease)   . BPH with obstruction/lower urinary tract symptoms   . CVA (cerebrovascular accident) 03/11/2012  . Right hemiparesis 03/11/2012  . Type 2 diabetes, controlled, with neuropathy 03/11/2012  . OSA (obstructive sleep apnea) 03/11/2012  . CKD (chronic kidney disease) stage 3, GFR 30-59 ml/min  03/11/2012  . Migraines 03/11/2012    Miller,Jennifer L 09/24/2014, 9:34 AM  [facility] [address] [place], [place], [address] Phone: [phone]   Fax:  [phone]  PHYSICAL THERAPY DISCHARGE SUMMARY  Visits from Start of Care: 15  Current functional level related to goals / functional outcomes:     PT Long Term Goals - 09/24/14 0914    PT LONG TERM GOAL #1   Title Pt will be able to verbalize understanding of falls prevention strategies to reduce risk of falls. (09/22/14)   Status Achieved   PT LONG TERM GOAL #2   Title Pt will improve BERG score to >/=49/56 to decrease falls risk. (09/22/14)   Baseline 49/56 on 10/03/14.   Status Achieved   PT LONG TERM GOAL #3   Title Pt will improve gait speed, with LRAD, to >/=3.16ft/sec. to improve functional mobility. (09/22/14)   Baseline 3.2ft/sec. on 09/23/14.   Status Achieved   PT LONG TERM GOAL #4   Title Pt will be able to ambulate >/= 15 minutes without back pain. (09/22/14)   Baseline >15 minutes without pain.   Status Achieved   PT LONG TERM GOAL #5   Title Pt will be able to ambulate 600', with LRAD, over even/uneven terrain at MOD I level to improve functional mobility. (09/22/14)   Baseline 650' with LRAD at MOD I   Status Achieved   PT LONG TERM GOAL #6   Title Pt will improve SIS-mobility score by 10% to improve quality of life. (09/22/14)   Status Not met.       Remaining deficits: Pt continues to experience R LE weakness, which is improved with AFO donned   and upright posture during ambulation.   Education / Equipment: HEP, falls prevention handout Plan: Patient agrees to discharge.  Patient goals were met. Patient is being discharged due to meeting the stated rehab goals.  ?????       Geoffry Paradise, PT,DPT 09/24/2014 9:34 AM Phone: [phone] Fax: [phone]

## 2014-09-26 ENCOUNTER — Other Ambulatory Visit: Payer: Self-pay | Admitting: Family Medicine

## 2014-09-29 ENCOUNTER — Ambulatory Visit: Payer: Medicare Other | Admitting: Occupational Therapy

## 2014-10-01 ENCOUNTER — Ambulatory Visit: Payer: Medicare Other | Admitting: Occupational Therapy

## 2014-10-01 ENCOUNTER — Encounter: Payer: Self-pay | Admitting: Occupational Therapy

## 2014-10-01 DIAGNOSIS — R279 Unspecified lack of coordination: Secondary | ICD-10-CM | POA: Diagnosis not present

## 2014-10-01 DIAGNOSIS — R531 Weakness: Secondary | ICD-10-CM | POA: Diagnosis not present

## 2014-10-01 DIAGNOSIS — R609 Edema, unspecified: Secondary | ICD-10-CM | POA: Diagnosis not present

## 2014-10-01 DIAGNOSIS — R269 Unspecified abnormalities of gait and mobility: Secondary | ICD-10-CM | POA: Diagnosis not present

## 2014-10-01 DIAGNOSIS — I69898 Other sequelae of other cerebrovascular disease: Secondary | ICD-10-CM | POA: Diagnosis not present

## 2014-10-01 DIAGNOSIS — I6980 Unspecified sequelae of other cerebrovascular disease: Secondary | ICD-10-CM | POA: Diagnosis not present

## 2014-10-01 DIAGNOSIS — IMO0002 Reserved for concepts with insufficient information to code with codable children: Secondary | ICD-10-CM

## 2014-10-01 NOTE — Patient Instructions (Signed)
Lateral Weight Shift: Upper Trunk Leading   Sit with feet flat on floor. Bring ____ shoulder, head and arm toward side until forearm/elbow just touches sitting surface. Hold __10__ seconds. Return to upright position by pushing through arm Repeat _10___ times per session. Do __2__ sessions per day.   Forward Upper Body Weight Shift   Place both hands on a chair or stool in front. Lean body forward, then return to upright. Try to shift more weight onto involved arm. Repeat __10__ times. Do _2___ sessions per day.  [email or web address].[email or web address]   SITTING: Forward Weight Shift   Sit upright, place both hands on sitting surface. Lean chest forward, keep back straight. Hold _5__ seconds. _10__ reps per set, _2__ sets per day.  Place wedge under hand if wrist range of motion is limited. Also bring Lt arm up and repeat if chair is stable!   Lateral Weight Bearing (Standing)   Rest palms comfortably on table, gently lean to the side and forward over hand. Hold _10___ seconds. Repeat __10__ times. Do __1-2__ sessions per day.     Weight Bearing (All Fours)   On hands and knees, shift weight on arms by rocking slowly backwards and forwards. Can also slowly increase weight onto weaker arm if able.  Keep neck and back straight. Repeat _10___ times. Do ___1-2_ sessions per day.

## 2014-10-01 NOTE — Therapy (Signed)
[facility] [address] [place], [name], [address] Phone: [phone]   Fax:  [phone]  Occupational Therapy Treatment  Patient Details  Name: James Bell MRN: [id number] Date of Birth: Feb 24, 1931 Referring Provider:  [name], MD  Encounter Date: 10/01/2014      OT End of Session - 10/01/14 1013    Visit Number 11   Number of Visits 17   Date for OT Re-Evaluation 10/06/14   Authorization Type MCR TRAD, TRICARE - G code needed!   OT Start Time 0930   OT Stop Time 1010   OT Time Calculation (min) 40 min   Activity Tolerance Patient tolerated treatment well      Past Medical History  Diagnosis Date  . HLD (hyperlipidemia)   . Restless leg syndrome     well controlled on mirapex  . OSA on CPAP     sleep study 01/2014 - severe, CPAP at 10, previously on nuvigil (Dr. [name])  . Type 2 diabetes, controlled, with neuropathy 2011  . H/O hiatal hernia   . GERD (gastroesophageal reflux disease)     with sliding HH  . Migraines 03/11/12    now rare  . CVA (cerebral infarction) 03/07/12    slurred speech; right hemplegia, left handed - completed PT 07/2014 (17 sessions)  . Basal cell carcinoma of nose 2002    bridge of nose  . History of kidney problems 12/2010    lacerated left kidney after fall  . BPH with obstruction/lower urinary tract symptoms     failed flomax, uroxatral, myrbetriq - sees urology [name]) pending videourodynamics  . Depression     mild, on cymbalta per prior PCP  . Palpitations 01/2012    holter WNL, rare PAC/PVCs  . Erectile dysfunction     per urology (prostaglandin injection)  . Kidney cysts 02/2014    bilateral (renal US)  . CKD (chronic kidney disease) stage 3, GFR 30-59 ml/min 03/11/2012    Renal US 02/2014 - multiple kidney cysts   . TIA (transient ischemic attack)   . Anemia     Past Surgical History  Procedure Laterality Date  . Prostate surgery   02/2009    PVP (laser)  . Tonsillectomy and adenoidectomy  ~ 1945  . Hemorrhoid surgery  1991  . Cataract extraction w/ intraocular lens implant  11//2012 - 08/2011  . Inguinal hernia repair  1981; 2002    left, then repaired  . Skin cancer excision  2002    bridge of nose, BCC  . Colonoscopy  05/2011    normal per pt (Dr. [name], [place], [name])  . Mri brain  04/2010    WNL  . Esophagogastroduodenoscopy  2006    duodenitis, medual HH, Grade 2 reflux, mild gastritis  . Colonoscopy  03/2011    poor prep, unable to biopsy polyp in tic fold, rpt 3 mo [name])  . Abi  2007    WNL  . Urethral dilation  2014    tannenbaum  . Cystoscopy with urethral dilatation N/A 07/24/2013    Procedure: CYSTOSCOPY WITH URETHRAL DILATATION;  Surgeon: [name], MD;  Location: [facility];  Service: Urology;  Laterality: N/A;  . Transurethral incision of prostate N/A 07/24/2013    Procedure: TRANSURETHRAL INCISION OF THE PROSTATE (TUIP);  Surgeon: [name], MD;  Location: [facility];  Service: Urology;  Laterality: N/A;  . Esophagogastroduodenoscopy  07/2011    mild chronic gastritis  . Colonoscopy  06/2011    mult polyps adenomatous, severe diverticulosis, rpt 3 yrs [name])  . Carotid endarterectomy    . Cardiac catheterization  2004    normal; [facility]  . Cardiovascular stress test  02/2014    WNL, low risk scan, EF 68%    There were no vitals taken for this visit.  Visit Diagnosis:  Weakness due to cerebrovascular accident  Lack of coordination due to stroke  Edema      Subjective Assessment - 10/01/14 0934    Symptoms "I feel like it's gotten better overall"   Currently in Pain? No/denies          [facility] OT Assessment - 10/01/14 0950    Coordination   Box and Blocks Rt = 14               OT Treatments/Exercises (OP) - 10/01/14 0569    ADLs   ADL Comments Assessed and reviewed LTG's and progress to date with patient. Also reviewed weight bearing ex's and  provided HEP   Shoulder Exercises: Seated   Other Seated Exercises UBE x 10 min Level 3 for reciprocal movement pattern   Manual Therapy   Manual Therapy Passive ROM   Passive ROM Rt hand in composite flexion                 OT Education - 10/01/14 0956    Education provided Yes   Education Details weight bearing HEP   Person(s) Educated Patient   Methods Explanation;Handout   Comprehension Verbalized understanding          OT Short Term Goals - 10/01/14 1004    OT SHORT TERM GOAL #1   Title Pt will be I home program   Status Achieved   OT SHORT TERM GOAL #2   Title Pt will demonstrate increased ability to perform gross grasp R hand inpreparation for increased I ADL's/homemaking tasks   Status Achieved  w/ modifications and compensatory strategies   OT SHORT TERM GOAL #3   Title Pt will demonstrate increased AROM R hand as evidenced by ability to perform flat fist in preparation for increased functional use RUE   Status Not Met  Currently 80%, after stretches 90%   OT SHORT TERM GOAL #4   Title Pt will independently state edema control techniques   Status Achieved           OT Long Term Goals - 10/01/14 1004    OT LONG TERM GOAL #1   Title Pt will demonstrate increased independence with ADLhomemaking as evidenced by his ability to use R & LUE's to remove small dish from oven   Baseline Initially using forearm as groos assist/to balance objects and LUE as dominant hand   Status Achieved  with small dishes only   OT LONG TERM GOAL #2   Title Pt will be I with upgraded HEP RUE   Status Achieved   OT LONG TERM GOAL #3   Title Pt will demonstrate increased ability to perform grasp release as seen by improvement RUE Box and Blocks score by 10 or more blocks   Baseline Initial assessment RUE = 10 blocks   Status Not Met  Currently = 14 blocks   OT LONG TERM GOAL #4   Title Pt will reports increased functional use RUE as functional assist for ADL's, homemaking  tasks 80% of the time or greater.   Status Achieved  per pt report  Plan - Oct 16, 2014 1007    Clinical Impression Statement Pt met 3/4 STG's and 3/4 LTG's. Pt still has difficulty w/ RUE and Rt hand use, but improved since initial evaluation.    Plan D/C O.T.           G-Codes - October 16, 2014 1008    Functional Assessment Tool Used Box & Blocks Rt =  14  (eval Rt= 10)   Functional Limitation Carrying, moving and handling objects   Carrying, Moving and Handling Objects Goal Status ([id number]) At least 40 percent but less than 60 percent impaired, limited or restricted   Carrying, Moving and Handling Objects Discharge Status [phone]) At least 60 percent but less than 80 percent impaired, limited or restricted      Problem List Patient Active Problem List   Diagnosis Date Noted  . Spastic hemiparesis affecting dominant side 07/07/2014  . Right leg swelling 06/09/2014  . Left shoulder pain 03/16/2014  . Chest pressure 02/03/2014  . Spastic hemiplegia affecting dominant side 11/07/2013  . Medicare annual wellness visit, subsequent 11/06/2013  . Erectile dysfunction due to arterial insufficiency 02/05/2013  . Depression   . Palpitations 06/26/2012  . HLD (hyperlipidemia)   . Restless leg syndrome   . GERD (gastroesophageal reflux disease)   . BPH with obstruction/lower urinary tract symptoms   . CVA (cerebrovascular accident) 03/11/2012  . Right hemiparesis 03/11/2012  . Type 2 diabetes, controlled, with neuropathy 03/11/2012  . OSA (obstructive sleep apnea) 03/11/2012  . CKD (chronic kidney disease) stage 3, GFR 30-59 ml/min 03/11/2012  . Migraines 03/11/2012    OCCUPATIONAL THERAPY DISCHARGE SUMMARY  Visits from Start of Care: 11  Current functional level related to goals / functional outcomes: SEE ABOVE   Remaining deficits: RUE weakness RUE ROM RUE coordination Rt hand edema   Education / Equipment: Pt provided w/ HEP's for RUE, edema management  strategies, and CVA education  Plan: Patient agrees to discharge.  Patient goals were partially met. Patient is being discharged due to                                                     Reaching maximal rehab potential.   ?????       Carey Bullocks, OTR/L 10-16-2014, 10:13 AM  [facility] [address] [place], [name], [address] Phone: [phone]   Fax:  [phone]

## 2014-10-05 ENCOUNTER — Ambulatory Visit (HOSPITAL_BASED_OUTPATIENT_CLINIC_OR_DEPARTMENT_OTHER): Payer: Medicare Other | Admitting: Physical Medicine & Rehabilitation

## 2014-10-05 ENCOUNTER — Encounter: Payer: Medicare Other | Attending: Physical Medicine & Rehabilitation

## 2014-10-05 ENCOUNTER — Encounter: Payer: Self-pay | Admitting: Physical Medicine & Rehabilitation

## 2014-10-05 VITALS — BP 150/72 | HR 61 | Resp 14

## 2014-10-05 DIAGNOSIS — G8110 Spastic hemiplegia affecting unspecified side: Secondary | ICD-10-CM | POA: Diagnosis not present

## 2014-10-05 DIAGNOSIS — M7989 Other specified soft tissue disorders: Secondary | ICD-10-CM | POA: Diagnosis not present

## 2014-10-05 NOTE — Patient Instructions (Signed)
Musculocutaneous nerve block with phenol today. This medication may start taking the fact today however full effect will be at about one week Duration of the effect is 3-6 months Side effects of medication may include right heel numbness or burning. Call if you have burning pain so we can recommend any medication for that.

## 2014-10-05 NOTE — Progress Notes (Signed)
Phenol neurolysis of the Right musculo- cutaneous nerve  Indication: Severe spasticity in the arm flexor muscles which is not responding to medical management and other conservative care and interfering with functional use and hygiene.  Informed consent was obtained after describing the risks and benefits of the procedure with the patient this includes bleeding bruising and infection as well as medication side effects. The patient elected to proceed and has given written consent. Patient placed in a supine position on the exam table. External DC stimulation was applied to the axilla using a nerve stimulator. Arm flexion twitch was obtained. The axillary region was prepped with Betadine and then entered with a 22-gauge 40 mm needle electrode under electrical stimulation guidance. Arm flexion which was obtained and confirmed. Then 4 cc of 5% phenol were injected. The patient tolerated procedure well. Post procedure instructions and followup visit were given.

## 2014-10-06 ENCOUNTER — Ambulatory Visit: Payer: Medicare Other | Admitting: Occupational Therapy

## 2014-10-09 ENCOUNTER — Ambulatory Visit: Payer: Medicare Other | Admitting: Family Medicine

## 2014-10-13 DIAGNOSIS — R600 Localized edema: Secondary | ICD-10-CM | POA: Diagnosis not present

## 2014-10-13 DIAGNOSIS — R21 Rash and other nonspecific skin eruption: Secondary | ICD-10-CM | POA: Diagnosis not present

## 2014-10-13 DIAGNOSIS — E1142 Type 2 diabetes mellitus with diabetic polyneuropathy: Secondary | ICD-10-CM | POA: Diagnosis not present

## 2014-10-13 DIAGNOSIS — E785 Hyperlipidemia, unspecified: Secondary | ICD-10-CM | POA: Diagnosis not present

## 2014-10-29 ENCOUNTER — Telehealth: Payer: Self-pay | Admitting: Neurology

## 2014-10-29 DIAGNOSIS — Z9989 Dependence on other enabling machines and devices: Principal | ICD-10-CM

## 2014-10-29 DIAGNOSIS — G4733 Obstructive sleep apnea (adult) (pediatric): Secondary | ICD-10-CM

## 2014-10-29 NOTE — Telephone Encounter (Signed)
Pt calls because he says that his CPAP is now 79 years old and he wishes to begin the process to obtain a new machine.  Advised pt request to place order for new CPAP will be sent to Dr. Rexene Alberts

## 2014-10-29 NOTE — Telephone Encounter (Signed)
New cpap order placed per pt request.

## 2014-11-16 ENCOUNTER — Encounter: Payer: Medicare Other | Attending: Physical Medicine & Rehabilitation

## 2014-11-16 ENCOUNTER — Ambulatory Visit: Payer: Medicare Other | Admitting: Physical Medicine & Rehabilitation

## 2014-11-16 DIAGNOSIS — M7989 Other specified soft tissue disorders: Secondary | ICD-10-CM | POA: Insufficient documentation

## 2014-11-16 DIAGNOSIS — R946 Abnormal results of thyroid function studies: Secondary | ICD-10-CM | POA: Diagnosis not present

## 2014-11-16 DIAGNOSIS — G8110 Spastic hemiplegia affecting unspecified side: Secondary | ICD-10-CM | POA: Insufficient documentation

## 2014-11-20 ENCOUNTER — Ambulatory Visit (HOSPITAL_BASED_OUTPATIENT_CLINIC_OR_DEPARTMENT_OTHER): Payer: Medicare Other | Admitting: Physical Medicine & Rehabilitation

## 2014-11-20 ENCOUNTER — Encounter: Payer: Self-pay | Admitting: Physical Medicine & Rehabilitation

## 2014-11-20 ENCOUNTER — Encounter: Payer: Medicare Other | Attending: Physical Medicine & Rehabilitation

## 2014-11-20 VITALS — BP 144/88 | HR 64 | Resp 14

## 2014-11-20 DIAGNOSIS — M20031 Swan-neck deformity of right finger(s): Secondary | ICD-10-CM | POA: Diagnosis not present

## 2014-11-20 DIAGNOSIS — M7989 Other specified soft tissue disorders: Secondary | ICD-10-CM | POA: Insufficient documentation

## 2014-11-20 DIAGNOSIS — G8110 Spastic hemiplegia affecting unspecified side: Secondary | ICD-10-CM

## 2014-11-20 NOTE — Progress Notes (Addendum)
Subjective:    Patient ID: James Bell, male    DOB: [birth year], 81 y.o.   MRN: [id number]  HPI 81 year old male with left Basal ganglia CVA June 2013, causing right spastic hemiplegia Last seen for phenol neural lysis of the right musculocutaneous nerve 10/05/2014. Patient is very pleased with the outcome of his injection now able to fully extend the right elbow without difficulties. He is able to reach out and shake hands with people now. Last Botox in July 2015. He has some residual right swan-neck deformity of the middle finger, previously evaluated by orthopedic hand surgeon but patient does not want to consider surgery. Has not seen an OT hand specialist Pain Inventory Average Pain 0 Pain Right Now 0 My pain is NO PAIN  In the last 24 hours, has pain interfered with the following? General activity 0 Relation with others 0 Enjoyment of life 0 What TIME of day is your pain at its worst? NO PAIN Sleep (in general) Fair  Pain is worse with: NO PAIN Pain improves with: NO PAIN Relief from Meds: 0  Mobility use a cane how many minutes can you walk? 10 ability to climb steps?  yes do you drive?  yes  Function retired  Neuro/Psych No problems in this area  Prior Studies Any changes since last visit?  no  Physicians involved in your care Any changes since last visit?  no   Family History  Problem Relation Age of Onset  . Stroke Father   . Lung cancer Father 65     smoker  . Stroke Brother   . Diabetes Brother 48  . Coronary artery disease Neg Hx   . Lung cancer Brother   . Alcoholism Sister   . Kidney disease Sister    History   Social History  . Marital Status: Unknown    Spouse Name: N/A  . Number of Children: 2  . Years of Education: MD   Occupational History  . retired   .     Social History Main Topics  . Smoking status: Never Smoker   . Smokeless tobacco: Never Used     Comment: "used to smoke pipe when I was younger"  . Alcohol  Use: 2.4 oz/week    2 Glasses of wine, 2 Shots of liquor per week     Comment: occasional  . Drug Use: No  . Sexual Activity: Yes   Other Topics Concern  . None   Social History Narrative   Caffeine: 1 cup coffee/day   Divorced   Occupation: ophthalmologist   Edu: MD   Activity: rehab s/p CVA   Diet: some water, fruits/vegetables daily   POA - [name] (Daughter)   Past Surgical History  Procedure Laterality Date  . Prostate surgery  02/2009    PVP (laser)  . Tonsillectomy and adenoidectomy  ~ 1945  . Hemorrhoid surgery  1991  . Cataract extraction w/ intraocular lens implant  11//2012 - 08/2011  . Inguinal hernia repair  1981; 2002    left, then repaired  . Skin cancer excision  2002    bridge of nose, BCC  . Colonoscopy  05/2011    normal per pt (Dr. [name], [place], [name])  . Mri brain  04/2010    WNL  . Esophagogastroduodenoscopy  2006    duodenitis, medual HH, Grade 2 reflux, mild gastritis  . Colonoscopy  03/2011    poor prep, unable to biopsy polyp in tic fold, rpt 3 mo (  [name])  . Abi  2007    WNL  . Urethral dilation  2014    tannenbaum  . Cystoscopy with urethral dilatation N/A 07/24/2013    Procedure: CYSTOSCOPY WITH URETHRAL DILATATION;  Surgeon: [name], MD;  Location: [facility];  Service: Urology;  Laterality: N/A;  . Transurethral incision of prostate N/A 07/24/2013    Procedure: TRANSURETHRAL INCISION OF THE PROSTATE (TUIP);  Surgeon: [name], MD;  Location: [facility];  Service: Urology;  Laterality: N/A;  . Esophagogastroduodenoscopy  07/2011    mild chronic gastritis  . Colonoscopy  06/2011    mult polyps adenomatous, severe diverticulosis, rpt 3 yrs [name])  . Carotid endarterectomy    . Cardiac catheterization  2004    normal; [facility]  . Cardiovascular stress test  02/2014    WNL, low risk scan, EF 68%   Past Medical History  Diagnosis Date  . HLD (hyperlipidemia)   . Restless leg syndrome     well controlled on  mirapex  . OSA on CPAP     sleep study 01/2014 - severe, CPAP at 10, previously on nuvigil (Dr. [name])  . Type 2 diabetes, controlled, with neuropathy 2011  . H/O hiatal hernia   . GERD (gastroesophageal reflux disease)     with sliding HH  . Migraines 03/11/12    now rare  . CVA (cerebral infarction) 03/07/12    slurred speech; right hemplegia, left handed - completed PT 07/2014 (17 sessions)  . Basal cell carcinoma of nose 2002    bridge of nose  . History of kidney problems 12/2010    lacerated left kidney after fall  . BPH with obstruction/lower urinary tract symptoms     failed flomax, uroxatral, myrbetriq - sees urology [name]) pending videourodynamics  . Depression     mild, on cymbalta per prior PCP  . Palpitations 01/2012    holter WNL, rare PAC/PVCs  . Erectile dysfunction     per urology (prostaglandin injection)  . Kidney cysts 02/2014    bilateral (renal US)  . CKD (chronic kidney disease) stage 3, GFR 30-59 ml/min 03/11/2012    Renal US 02/2014 - multiple kidney cysts   . TIA (transient ischemic attack)   . Anemia    BP 144/88 mmHg  Pulse 64  Resp 14  SpO2 98%  Opioid Risk Score:   Fall Risk Score: Low Fall Risk (0-5 points)  Review of Systems  Constitutional: Negative.   HENT: Negative.   Eyes: Negative.   Respiratory: Negative.   Cardiovascular: Negative.   Gastrointestinal: Negative.   Endocrine: Negative.   Genitourinary: Negative.   Musculoskeletal: Negative.   Skin: Negative.   Allergic/Immunologic: Negative.   Neurological: Negative.   Hematological: Negative.   Psychiatric/Behavioral: Negative.        Objective:   Physical Exam  Constitutional: He is oriented to person, place, and time. He appears well-developed and well-nourished.  HENT:  Head: Normocephalic and atraumatic.  Eyes: Conjunctivae and EOM are normal. Pupils are equal, round, and reactive to light.  Neurological: He is alert and oriented to person, place, and time.    Psychiatric: He has a normal mood and affect.  Nursing note and vitals reviewed.  Right middle finger swan neck deformity. Nontender to palpation reducible with flexion. Ashworth 0-1 in the finger flexors. Ashworth 0-1 in the wrist flexors Ashworth 1 in the elbow flexors Upper extremity strength 3 minus at the finger flexors and wrist extensors 4 minus at the  biceps and triceps  Gait without evidence of toe drag or knee instability       Assessment & Plan:  1. Right spastic hemiplegia secondary to left basal ganglia infarct from 2013. Overall doing quite well with right upper summary function. His main concern is his swan-neck deformity which I do not think is stroke related he may benefit from a ring type orthosis to correct the deformity. Referral to OT hand specialist  As discussed with patient the musculocutaneous nerve block/phenol neural lysis has average duration of 3-6 months currently 6 weeks post Return to clinic in 2 months possible reinjection at that time depending on his spasticity In the biceps

## 2014-11-25 ENCOUNTER — Other Ambulatory Visit: Payer: Self-pay | Admitting: Family Medicine

## 2014-12-05 ENCOUNTER — Other Ambulatory Visit: Payer: Self-pay | Admitting: Family Medicine

## 2014-12-08 ENCOUNTER — Ambulatory Visit: Payer: Medicare Other | Attending: Physical Medicine & Rehabilitation | Admitting: Occupational Therapy

## 2014-12-08 ENCOUNTER — Encounter: Payer: Self-pay | Admitting: Occupational Therapy

## 2014-12-08 DIAGNOSIS — R609 Edema, unspecified: Secondary | ICD-10-CM | POA: Diagnosis not present

## 2014-12-08 DIAGNOSIS — R269 Unspecified abnormalities of gait and mobility: Secondary | ICD-10-CM | POA: Insufficient documentation

## 2014-12-08 DIAGNOSIS — R531 Weakness: Secondary | ICD-10-CM | POA: Diagnosis not present

## 2014-12-08 DIAGNOSIS — I6980 Unspecified sequelae of other cerebrovascular disease: Secondary | ICD-10-CM | POA: Insufficient documentation

## 2014-12-08 DIAGNOSIS — R279 Unspecified lack of coordination: Secondary | ICD-10-CM | POA: Diagnosis not present

## 2014-12-08 DIAGNOSIS — M2560 Stiffness of unspecified joint, not elsewhere classified: Secondary | ICD-10-CM

## 2014-12-08 DIAGNOSIS — I69898 Other sequelae of other cerebrovascular disease: Secondary | ICD-10-CM | POA: Insufficient documentation

## 2014-12-08 NOTE — Therapy (Signed)
[facility] [address] [place], [name], [address] Phone: [phone]   Fax:  [phone]  Occupational Therapy Evaluation  Patient Details  Name: [facility] MRN: [id number] Date of Birth: 1931/07/10 Referring Provider:  Charlett Blake, MD  Encounter Date: 12/08/2014      OT End of Session - 12/08/14 1249    Visit Number 1   Number of Visits 16   Date for OT Re-Evaluation 02/02/15   Authorization Type MCR TRAD, TRICARE - G code needed!   OT Start Time 1145   OT Stop Time 1235   OT Time Calculation (min) 50 min   Activity Tolerance Patient tolerated treatment well      Past Medical History  Diagnosis Date  . HLD (hyperlipidemia)   . Restless leg syndrome     well controlled on mirapex  . OSA on CPAP     sleep study 01/2014 - severe, CPAP at 10, previously on nuvigil (Dr. Lucienne Bell)  . Type 2 diabetes, controlled, with neuropathy 2011  . H/O hiatal hernia   . GERD (gastroesophageal reflux disease)     with sliding HH  . Migraines 03/11/12    now rare  . CVA (cerebral infarction) 03/07/12    slurred speech; right hemplegia, left handed - completed PT 07/2014 (17 sessions)  . Basal cell carcinoma of nose 2002    bridge of nose  . History of kidney problems 12/2010    lacerated left kidney after fall  . BPH with obstruction/lower urinary tract symptoms     failed flomax, uroxatral, myrbetriq - sees urology [facility]) pending videourodynamics  . Depression     mild, on cymbalta per prior PCP  . Palpitations 01/2012    holter WNL, rare PAC/PVCs  . Erectile dysfunction     per urology (prostaglandin injection)  . Kidney cysts 02/2014    bilateral (renal US)  . CKD (chronic kidney disease) stage 3, GFR 30-59 ml/min 03/11/2012    Renal US 02/2014 - multiple kidney cysts   . TIA (transient ischemic attack)   . Anemia     Past Surgical History  Procedure Laterality Date  . Prostate surgery   02/2009    PVP (laser)  . Tonsillectomy and adenoidectomy  ~ 1945  . Hemorrhoid surgery  1991  . Cataract extraction w/ intraocular lens implant  11//2012 - 08/2011  . Inguinal hernia repair  1981; 2002    left, then repaired  . Skin cancer excision  2002    bridge of nose, [facility]  . Colonoscopy  05/2011    normal per pt (Dr. Corky [facility], [place], [name])  . Mri brain  04/2010    WNL  . Esophagogastroduodenoscopy  2006    duodenitis, medual HH, Grade 2 reflux, mild gastritis  . Colonoscopy  03/2011    poor prep, unable to biopsy polyp in tic fold, rpt 3 mo [facility])  . Abi  2007    WNL  . Urethral dilation  2014    [facility]  . Cystoscopy with urethral dilatation N/A 07/24/2013    Procedure: CYSTOSCOPY WITH URETHRAL DILATATION;  Surgeon: [facility] Rud, MD;  Location: [facility];  Service: Urology;  Laterality: N/A;  . Transurethral incision of prostate N/A 07/24/2013    Procedure: TRANSURETHRAL INCISION OF THE PROSTATE (TUIP);  Surgeon: [facility] Rud, MD;  Location: [facility];  Service: Urology;  Laterality: N/A;  . Esophagogastroduodenoscopy  07/2011    mild chronic gastritis  . Colonoscopy  06/2011    mult polyps adenomatous, severe diverticulosis, rpt 3 yrs [facility])  . Carotid endarterectomy    . Cardiac catheterization  2004    normal; [facility]  . Cardiovascular stress test  02/2014    WNL, low risk scan, EF 68%    There were no vitals filed for this visit.  Visit Diagnosis:  Range of motion deficit - Plan: Ot plan of care cert/re-cert  Generalized weakness - Plan: Ot plan of care cert/re-cert      Subjective Assessment - 12/08/14 1150    Symptoms I want to know if I can do anything about my middle finger (Swan neck deformity)   Pertinent History see epic snapshot;  pt with H/o of CVA   Patient Stated Goals I have made alot of progress but would like to be able to do even more.   Currently in Pain? 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 2  no injury; on carpet when turning   Home  Environment   Family/patient expects to be discharged to: Private residence   Living Arrangements Alone   Type of Home Other (Comment)  one story condo   Additional Comments Ledge to go from garage to inside door. No outside steps    Prior Function   Level of Independence Independent with basic ADLs;Independent with homemaking with ambulation  prior to stroke   Vocation Retired   ADL   Eating/Feeding Modified independent  very difficult to cut food or peel food (i.e banana)   Grooming Independent  mod I uses L hand only   Upper Body Bathing Modified independent   Lower Body Bathing Modified independent   Upper Body Dressing Independent   Lower Body Dressing Minimal assistance  to tie shoes   Toilet Tranfer Modified independent  grab bar   Toileting - Clothing Manipulation Modified independent   Toileting -  Hygiene Modified Independent   Tub/Shower Transfer Modified independent  grab bar to shower stall   ADL comments Pt has shower seat and hand held shower; sits and stands intermittently for showering.  Pt has not fallen in shower.    IADL   Shopping Takes care of all shopping needs independently   Light Housekeeping Performs light daily tasks such as dishwashing, bed  making  has cleaning lady   Meal Prep Plans, prepares and serves adequate meals independently   [place] own vehicle   Medication Management Is responsible for taking medication in correct dosages at correct time   Physiological scientist financial matters independently (budgets, writes checks, pays rent, bills goes to bank),  collects and keeps track of income   Mobility   Mobility Status History of falls   Written Expression   Dominant Hand Right   Handwriting Mild micrographia   Vision - History   Baseline Vision No visual deficits  pt has implants that allow near and far vision   Activity Tolerance   Activity Tolerance Tolerate 30+ min activity without fatigue   Cognition   Overall Cognitive Status Within Functional Limits for tasks assessed   Sensation   Light Touch Appears Intact   Hot/Cold Appears Intact   Proprioception Appears Intact   Coordination   Gross Motor Movements are Fluid and Coordinated No   Fine Motor Movements are Fluid and Coordinated No   Box and Blocks R=16   Tone   Assessment Location Right Upper Extremity   ROM / Strength   AROM / PROM / Strength AROM;PROM;Strength   AROM   Overall AROM  Deficits   Overall AROM Comments Pt limited to 110* flexion and mild limitation in supination, ER   PROM   Overall PROM  Deficits   Overall PROM Comments 120 shoulder flexion with pain 2/10   Strength   Overall Strength Unable to assess  due to tone   Right Hand AROM   R Long  MCP 0-90 90 Degrees   R Long PIP 0-100 90 Degrees  hyperextension of 15*   R Long DIP 0-70 135 Degrees  extension -15;passively with alignment has Bell extension   Hand Function   Right Hand Gross Grasp Impaired   Right Hand Grip (lbs) 13 pounds   Comment --   RUE Tone   RUE Tone Mild                           OT Short Term Goals - 12/08/14 1308    OT SHORT TERM GOAL #1   Title Pt will be I with HEP - 01/05/2015   Status New   OT SHORT  TERM GOAL #2   Title Pt will demonstrate understanding of wear and care of splint for R hand.   Status New           OT Long Term Goals - 12/08/14 1309    OT LONG TERM GOAL #1   Title Pt will be I with upgraded HEP prn - 02/02/2015   Status New   OT LONG TERM GOAL #2   Title Pt will demonstrate understanding of joint protection strategies.    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  Pt presents today with the following impairments that impact functional use of his dominant hand:  hyperextension of the DIP and  llimited active extension of PIP of R middle finger, decreased composite flexion, decreased gross grasp strength, decreased AROM of RUE, pain in R shoulder with over head reach, increased tone, decreased functional use of  RUE. Impairments impact patient's ability to complete ADL's and IADL's.   Pt was referred for this visit and is scheduled for follow up botox injection for 01/21/2015. Will develop treatment plan and goals for Swan neck deformity at this time and if MD warrants referral post injection will folllow for stroke related issues at that  time. Pt aware and in agreement wtih plan. s   Rehab Potential Good   Clinical Impairments Affecting Rehab Potential Pt will benefit from skilled OT to address the above deficits.   OT Frequency 2x / week   OT Duration 8 weeks   OT Treatment/Interventions Self-care/ADL training;Cryotherapy;Electrical Stimulation;Moist Heat;Ultrasound;Fluidtherapy;Iontophoresis;Therapeutic exercise;Manual Therapy;Passive range of motion;Therapeutic activities;Splinting;Patient/family education   Plan initiate splint, HEP   Consulted and Agree with Plan of Care Patient          G-Codes - 12/26/2014 1312    Functional Assessment Tool Used Box and blocks R=16    Functional Limitation Carrying, moving and handling objects   Carrying, Moving and Handling Objects Current Status [phone]) At least 60 percent but less than 80 percent impaired, limited or restricted   Carrying, Moving and Handling Objects Goal Status ([id number]) At least 40 percent but less than 60 percent impaired, limited or restricted      Problem List Patient Active Problem List   Diagnosis Date Noted  . Spastic hemiparesis affecting dominant side 07/07/2014  . Right leg swelling 06/09/2014  . Left shoulder pain 03/16/2014  . Chest pressure 02/03/2014  . Spastic hemiplegia affecting dominant side 11/07/2013  . Medicare annual wellness visit, subsequent 11/06/2013  . Erectile dysfunction due to arterial insufficiency 02/05/2013  . Depression   . Palpitations 06/26/2012  . HLD (hyperlipidemia)   . Restless leg syndrome   . GERD (gastroesophageal reflux disease)   . BPH with obstruction/lower urinary tract symptoms   . CVA (cerebrovascular accident) 03/11/2012  . Right hemiparesis 03/11/2012  . Type 2 diabetes, controlled, with neuropathy 03/11/2012  . OSA (obstructive sleep apnea) 03/11/2012  . CKD (chronic kidney disease) stage 3, GFR 30-59 ml/min 03/11/2012  . Migraines 03/11/2012    Quay Burow, OTR/L December 26, 2014, 1:15 PM  [facility] [address] [place], [name], [address] Phone: [phone]   Fax:  [phone]

## 2014-12-15 ENCOUNTER — Other Ambulatory Visit: Payer: Self-pay | Admitting: Family Medicine

## 2014-12-16 ENCOUNTER — Ambulatory Visit: Payer: Medicare Other | Admitting: Occupational Therapy

## 2014-12-16 DIAGNOSIS — R609 Edema, unspecified: Secondary | ICD-10-CM | POA: Diagnosis not present

## 2014-12-16 DIAGNOSIS — M2560 Stiffness of unspecified joint, not elsewhere classified: Secondary | ICD-10-CM

## 2014-12-16 DIAGNOSIS — IMO0002 Reserved for concepts with insufficient information to code with codable children: Secondary | ICD-10-CM

## 2014-12-16 DIAGNOSIS — I6980 Unspecified sequelae of other cerebrovascular disease: Secondary | ICD-10-CM | POA: Diagnosis not present

## 2014-12-16 DIAGNOSIS — R279 Unspecified lack of coordination: Secondary | ICD-10-CM | POA: Diagnosis not present

## 2014-12-16 DIAGNOSIS — R531 Weakness: Secondary | ICD-10-CM | POA: Diagnosis not present

## 2014-12-16 DIAGNOSIS — M6289 Other specified disorders of muscle: Secondary | ICD-10-CM

## 2014-12-16 DIAGNOSIS — I69898 Other sequelae of other cerebrovascular disease: Secondary | ICD-10-CM | POA: Diagnosis not present

## 2014-12-16 DIAGNOSIS — R269 Unspecified abnormalities of gait and mobility: Secondary | ICD-10-CM | POA: Diagnosis not present

## 2014-12-16 NOTE — Patient Instructions (Signed)
Wear ring splints for 1 hour initally remove and check skin for pressure areas or redness that does not go away after 10-15 mins. If this is noted remove splint and bring back to therapy. If no problems gradually increase wear time by 30 mins each day. You can wear several times a day.Don't sleep in splint yet. When splint is off avoid extreme finger extension, block the middle finger joint to prevent hyperextension if opening/ closing hand.

## 2014-12-16 NOTE — Therapy (Signed)
[facility] [address] [place], [name], [address] Phone: [phone]   Fax:  [phone]  Occupational Therapy Treatment  Patient Details  Name: [facility] MRN: [id number] Date of Birth: 11/22/1930 Referring Provider:  [name], MD  Encounter Date: 12/16/2014      OT End of Session - 12/16/14 1623    Visit Number 2   Date for OT Re-Evaluation 02/02/15   Authorization Type MCR TRAD, TRICARE - G code needed!   OT Start Time 1318   OT Stop Time 1400   OT Time Calculation (min) 42 min   Activity Tolerance Patient tolerated treatment well   Behavior During Therapy WFL for tasks assessed/performed      Past Medical History  Diagnosis Date  . HLD (hyperlipidemia)   . Restless leg syndrome     well controlled on mirapex  . OSA on CPAP     sleep study 01/2014 - severe, CPAP at 10, previously on nuvigil (Dr. [name])  . Type 2 diabetes, controlled, with neuropathy 2011  . H/O hiatal hernia   . GERD (gastroesophageal reflux disease)     with sliding HH  . Migraines 03/11/12    now rare  . CVA (cerebral infarction) 03/07/12    slurred speech; right hemplegia, left handed - completed PT 07/2014 (17 sessions)  . Basal cell carcinoma of nose 2002    bridge of nose  . History of kidney problems 12/2010    lacerated left kidney after fall  . BPH with obstruction/lower urinary tract symptoms     failed flomax, uroxatral, myrbetriq - sees urology [name]) pending videourodynamics  . Depression     mild, on cymbalta per prior PCP  . Palpitations 01/2012    holter WNL, rare PAC/PVCs  . Erectile dysfunction     per urology (prostaglandin injection)  . Kidney cysts 02/2014    bilateral (renal US)  . CKD (chronic kidney disease) stage 3, GFR 30-59 ml/min 03/11/2012    Renal US 02/2014 - multiple kidney cysts   . TIA (transient ischemic attack)   . Anemia     Past Surgical History  Procedure  Laterality Date  . Prostate surgery  02/2009    PVP (laser)  . Tonsillectomy and adenoidectomy  ~ 1945  . Hemorrhoid surgery  1991  . Cataract extraction w/ intraocular lens implant  11//2012 - 08/2011  . Inguinal hernia repair  1981; 2002    left, then repaired  . Skin cancer excision  2002    bridge of nose, BCC  . Colonoscopy  05/2011    normal per pt (Dr. [name], [place], [name])  . Mri brain  04/2010    WNL  . Esophagogastroduodenoscopy  2006    duodenitis, medual HH, Grade 2 reflux, mild gastritis  . Colonoscopy  03/2011    poor prep, unable to biopsy polyp in tic fold, rpt 3 mo [name])  . Abi  2007    WNL  . Urethral dilation  2014    [facility]  . Cystoscopy with urethral dilatation N/A 07/24/2013    Procedure: CYSTOSCOPY WITH URETHRAL DILATATION;  Surgeon: [name], MD;  Location: [facility];  Service: Urology;  Laterality: N/A;  . Transurethral incision of prostate N/A 07/24/2013    Procedure: TRANSURETHRAL INCISION OF THE PROSTATE (TUIP);  Surgeon: [name], MD;  Location: [facility];  Service: Urology;  Laterality: N/A;  . Esophagogastroduodenoscopy  07/2011    mild chronic gastritis  .  Colonoscopy  06/2011    mult polyps adenomatous, severe diverticulosis, rpt 3 yrs [name])  . Carotid endarterectomy    . Cardiac catheterization  2004    normal; [facility]  . Cardiovascular stress test  02/2014    WNL, low risk scan, EF 68%    There were no vitals filed for this visit.  Visit Diagnosis:  Range of motion deficit  Generalized weakness  Lack of coordination due to stroke  Hypertonia      Subjective Assessment - 12/16/14 1621    Symptoms Denies pain   Pertinent History see epic snapshot;  pt with H/o of CVA   Patient Stated Goals I have made alot of progress but would like to be able to do even more.   Currently in Pain? No/denies      Treatment: Pt was noted to have swan neck deformity for ring and middle fingers of RUE. Pt was fitted with  oval 8 splints, wrapped with padding for improved comfort, fit and  pressure relief. Pt was educated in splint wear, care and precautions. He verbalized understanding and returned demonstration of application.                      OT Education - 12/16/14 1622    Education provided Yes   Education Details oval 8 splint wear, care precautions  for middle and ring fingers RUE   Person(s) Educated Patient   Methods Explanation;Handout;Demonstration   Comprehension Verbalized understanding;Returned demonstration          OT Short Term Goals - 12/08/14 1308    OT SHORT TERM GOAL #1   Title Pt will be I with HEP - 01/05/2015   Status New   OT SHORT TERM GOAL #2   Title Pt will demonstrate understanding of wear and care of splint for R hand.   Status New           OT Long Term Goals - 12/08/14 1309    OT LONG TERM GOAL #1   Title Pt will be I with upgraded HEP prn - 02/02/2015   Status New   OT LONG TERM GOAL #2   Title Pt will demonstrate understanding of joint protection strategies.    Status New               Plan - 12/16/14 1623    Clinical Impression Statement Pt demonstrates improved positioning for PIP joints for middle and ring fingers following application of oval -8 splints.   Plan splint check, HEP   OT Home Exercise Plan oval 8 size 13 for middle, size 10 for ring   Consulted and Agree with Plan of Care Patient        Problem List Patient Active Problem List   Diagnosis Date Noted  . Spastic hemiparesis affecting dominant side 07/07/2014  . Right leg swelling 06/09/2014  . Left shoulder pain 03/16/2014  . Chest pressure 02/03/2014  . Spastic hemiplegia affecting dominant side 11/07/2013  . Medicare annual wellness visit, subsequent 11/06/2013  . Erectile dysfunction due to arterial insufficiency 02/05/2013  . Depression   . Palpitations 06/26/2012  . HLD (hyperlipidemia)   . Restless leg syndrome   . GERD (gastroesophageal reflux  disease)   . BPH with obstruction/lower urinary tract symptoms   . CVA (cerebrovascular accident) 03/11/2012  . Right hemiparesis 03/11/2012  . Type 2 diabetes, controlled, with neuropathy 03/11/2012  . OSA (obstructive sleep apnea) 03/11/2012  . CKD (chronic kidney disease) stage  3, GFR 30-59 ml/min 03/11/2012  . Migraines 03/11/2012    [name] 12/16/2014, 4:30 PM Theone Murdoch, OTR/L [phone] Phone: [phone] 4:30 PM 12/16/2014 [facility] [address] [place], [name], [address] Phone: [phone]   Fax:  [phone]

## 2014-12-18 ENCOUNTER — Encounter: Payer: Medicare Other | Admitting: Occupational Therapy

## 2014-12-22 ENCOUNTER — Ambulatory Visit: Payer: Medicare Other | Attending: Physical Medicine & Rehabilitation | Admitting: Occupational Therapy

## 2014-12-22 DIAGNOSIS — R269 Unspecified abnormalities of gait and mobility: Secondary | ICD-10-CM | POA: Insufficient documentation

## 2014-12-22 DIAGNOSIS — R531 Weakness: Secondary | ICD-10-CM | POA: Insufficient documentation

## 2014-12-22 DIAGNOSIS — I69898 Other sequelae of other cerebrovascular disease: Secondary | ICD-10-CM | POA: Diagnosis not present

## 2014-12-22 DIAGNOSIS — R279 Unspecified lack of coordination: Secondary | ICD-10-CM | POA: Diagnosis not present

## 2014-12-22 DIAGNOSIS — I6980 Unspecified sequelae of other cerebrovascular disease: Secondary | ICD-10-CM | POA: Diagnosis not present

## 2014-12-22 DIAGNOSIS — M2560 Stiffness of unspecified joint, not elsewhere classified: Secondary | ICD-10-CM

## 2014-12-22 DIAGNOSIS — IMO0002 Reserved for concepts with insufficient information to code with codable children: Secondary | ICD-10-CM

## 2014-12-22 DIAGNOSIS — R609 Edema, unspecified: Secondary | ICD-10-CM | POA: Insufficient documentation

## 2014-12-22 NOTE — Patient Instructions (Signed)
  Support middle and ring fingers under PIP joint, gently flex and only extend to the point that the PIP joint does not hyperextend 10-20 reps 3x day   Block PIP joints for ring and middle finger focus on composite finger flexion/ extension especially at the MP joints, try to avoid hyperextension at PIP joints 10-20x, 3x day  Resume wearing ring splint for ring finger , hold off on wearing ring splint for middle finger until later this week  Please bring splints with you to next visit.

## 2014-12-23 NOTE — Therapy (Signed)
[facility] [address] [place], [name], [address] Phone: [phone]   Fax:  [phone]  Occupational Therapy Treatment  Patient Details  Name: James Bell MRN: [id number] Date of Birth: [birth year] Referring Provider:  [name], MD  Encounter Date: 12/22/2014      OT End of Session - 12/23/14 4944    Visit Number 3   Number of Visits 16   OT Start Time 0850   OT Stop Time 0930   OT Time Calculation (min) 40 min   Activity Tolerance Patient tolerated treatment well   Behavior During Therapy [facility] for tasks assessed/performed      Past Medical History  Diagnosis Date  . HLD (hyperlipidemia)   . Restless leg syndrome     well controlled on mirapex  . OSA on CPAP     sleep study 01/2014 - severe, CPAP at 10, previously on nuvigil (Dr. [name])  . Type 2 diabetes, controlled, with neuropathy 2011  . H/O hiatal hernia   . GERD (gastroesophageal reflux disease)     with sliding HH  . Migraines 03/11/12    now rare  . CVA (cerebral infarction) 03/07/12    slurred speech; right hemplegia, left handed - completed PT 07/2014 (17 sessions)  . Basal cell carcinoma of nose 2002    bridge of nose  . History of kidney problems 12/2010    lacerated left kidney after fall  . BPH with obstruction/lower urinary tract symptoms     failed flomax, uroxatral, myrbetriq - sees urology [name]) pending videourodynamics  . Depression     mild, on cymbalta per prior PCP  . Palpitations 01/2012    holter WNL, rare PAC/PVCs  . Erectile dysfunction     per urology (prostaglandin injection)  . Kidney cysts 02/2014    bilateral (renal US)  . CKD (chronic kidney disease) stage 3, GFR 30-59 ml/min 03/11/2012    Renal US 02/2014 - multiple kidney cysts   . TIA (transient ischemic attack)   . Anemia     Past Surgical History  Procedure Laterality Date  . Prostate surgery  02/2009    PVP (laser)  . Tonsillectomy  and adenoidectomy  ~ 1945  . Hemorrhoid surgery  1991  . Cataract extraction w/ intraocular lens implant  11//2012 - 08/2011  . Inguinal hernia repair  1981; 2002    left, then repaired  . Skin cancer excision  2002    bridge of nose, [facility]  . Colonoscopy  05/2011    normal per pt (Dr. [name], [place], [name])  . Mri brain  04/2010    WNL  . Esophagogastroduodenoscopy  2006    duodenitis, medual HH, Grade 2 reflux, mild gastritis  . Colonoscopy  03/2011    poor prep, unable to biopsy polyp in tic fold, rpt 3 mo [name])  . Abi  2007    WNL  . Urethral dilation  2014    tannenbaum  . Cystoscopy with urethral dilatation N/A 07/24/2013    Procedure: CYSTOSCOPY WITH URETHRAL DILATATION;  Surgeon: [name], MD;  Location: [facility];  Service: Urology;  Laterality: N/A;  . Transurethral incision of prostate N/A 07/24/2013    Procedure: TRANSURETHRAL INCISION OF THE PROSTATE (TUIP);  Surgeon: [name], MD;  Location: [facility];  Service: Urology;  Laterality: N/A;  . Esophagogastroduodenoscopy  07/2011    mild chronic gastritis  . Colonoscopy  06/2011    mult polyps adenomatous, severe diverticulosis,  rpt 3 yrs [name])  . Carotid endarterectomy    . Cardiac catheterization  2004    normal; [facility]  . Cardiovascular stress test  02/2014    WNL, low risk scan, EF 68%    There were no vitals filed for this visit.  Visit Diagnosis:  Range of motion deficit  Lack of coordination due to stroke  Generalized weakness      Subjective Assessment - 12/22/14 0907    Subjective  Pt. cut his fingertip on a grater   Pertinent History see epic snapshot;  pt with H/o of CVA   Currently in Pain? Yes   Pain Score 2    Pain Location Finger (Comment which one)  middle   Aggravating Factors  movement   Pain Relieving Factors repositioning       Treatment: Pt arrived without his splints. Pt reports he has won them without problems yet he has removed since he cut middle finger  on a grater.  Therapist removed band aide and cleaned around pts finger tip cut with alcohol swab. Therapist recommended that pt wait for anonther day or 2 before wearing splint on middle finger for long time periods(it would be ok to wear for 10-20 mins during functional activity). Therapist instructed pt in HEP, pt returned demonstration. Therapist  applied an oval 8 splint on middle finger and pt performed functional grasp/ release of dowel pegs with min v.c. To avoid compensation.                       OT Short Term Goals - 12/08/14 1308    OT SHORT TERM GOAL #1   Title Pt will be I with HEP - 01/05/2015   Status New   OT SHORT TERM GOAL #2   Title Pt will demonstrate understanding of wear and care of splint for R hand.   Status New           OT Long Term Goals - 12/08/14 1309    OT LONG TERM GOAL #1   Title Pt will be I with upgraded HEP prn - 02/02/2015   Status New   OT LONG TERM GOAL #2   Title Pt will demonstrate understanding of joint protection strategies.    Status New               Plan - 12/23/14 6967    Clinical Impression Statement Pt is progressing towards goals. Pt reports oval 8 splints have been fitting well.   Plan review HEP., splint check   OT Home Exercise Plan oval 8 size 13 for middle, size 10 for ring   Consulted and Agree with Plan of Care Patient        Problem List Patient Active Problem List   Diagnosis Date Noted  . Spastic hemiparesis affecting dominant side 07/07/2014  . Right leg swelling 06/09/2014  . Left shoulder pain 03/16/2014  . Chest pressure 02/03/2014  . Spastic hemiplegia affecting dominant side 11/07/2013  . Medicare annual wellness visit, subsequent 11/06/2013  . Erectile dysfunction due to arterial insufficiency 02/05/2013  . Depression   . Palpitations 06/26/2012  . HLD (hyperlipidemia)   . Restless leg syndrome   . GERD (gastroesophageal reflux disease)   . BPH with obstruction/lower urinary  tract symptoms   . CVA (cerebrovascular accident) 03/11/2012  . Right hemiparesis 03/11/2012  . Type 2 diabetes, controlled, with neuropathy 03/11/2012  . OSA (obstructive sleep apnea) 03/11/2012  . CKD (chronic kidney disease)  stage 3, GFR 30-59 ml/min 03/11/2012  . Migraines 03/11/2012    Win Guajardo 12/23/2014, 8:24 AM Theone Murdoch, OTR/L [phone] Phone: [phone] 8:24 AM 12/23/2014 [facility] [address] [place], [name], [address] Phone: [phone]   Fax:  [phone]

## 2014-12-24 ENCOUNTER — Ambulatory Visit: Payer: Medicare Other | Admitting: Occupational Therapy

## 2014-12-24 DIAGNOSIS — R531 Weakness: Secondary | ICD-10-CM | POA: Diagnosis not present

## 2014-12-24 DIAGNOSIS — M2560 Stiffness of unspecified joint, not elsewhere classified: Secondary | ICD-10-CM

## 2014-12-24 DIAGNOSIS — IMO0002 Reserved for concepts with insufficient information to code with codable children: Secondary | ICD-10-CM

## 2014-12-24 DIAGNOSIS — M6289 Other specified disorders of muscle: Secondary | ICD-10-CM

## 2014-12-24 DIAGNOSIS — I69898 Other sequelae of other cerebrovascular disease: Secondary | ICD-10-CM | POA: Diagnosis not present

## 2014-12-24 DIAGNOSIS — I6980 Unspecified sequelae of other cerebrovascular disease: Secondary | ICD-10-CM | POA: Diagnosis not present

## 2014-12-24 DIAGNOSIS — R269 Unspecified abnormalities of gait and mobility: Secondary | ICD-10-CM | POA: Diagnosis not present

## 2014-12-24 DIAGNOSIS — R609 Edema, unspecified: Secondary | ICD-10-CM | POA: Diagnosis not present

## 2014-12-24 DIAGNOSIS — R279 Unspecified lack of coordination: Secondary | ICD-10-CM | POA: Diagnosis not present

## 2014-12-24 NOTE — Therapy (Signed)
[facility] [address] [place], [name], [address] Phone: [phone]   Fax:  [phone]  Occupational Therapy Treatment  Patient Details  Name: James Bell MRN: [id number] Date of Birth: [birth year] Referring Provider:  [name], MD  Encounter Date: 12/24/2014      OT End of Session - 12/24/14 1252    Visit Number 4   Number of Visits 16   Authorization Type MCR TRAD, TRICARE - G code needed!   OT Start Time 0805   OT Stop Time 0853   OT Time Calculation (min) 48 min   Activity Tolerance Patient tolerated treatment well   Behavior During Therapy [facility] for tasks assessed/performed      Past Medical History  Diagnosis Date  . HLD (hyperlipidemia)   . Restless leg syndrome     well controlled on mirapex  . OSA on CPAP     sleep study 01/2014 - severe, CPAP at 10, previously on nuvigil (Dr. [name])  . Type 2 diabetes, controlled, with neuropathy 2011  . H/O hiatal hernia   . GERD (gastroesophageal reflux disease)     with sliding HH  . Migraines 03/11/12    now rare  . CVA (cerebral infarction) 03/07/12    slurred speech; right hemplegia, left handed - completed PT 07/2014 (17 sessions)  . Basal cell carcinoma of nose 2002    bridge of nose  . History of kidney problems 12/2010    lacerated left kidney after fall  . BPH with obstruction/lower urinary tract symptoms     failed flomax, uroxatral, myrbetriq - sees urology [name]) pending videourodynamics  . Depression     mild, on cymbalta per prior PCP  . Palpitations 01/2012    holter WNL, rare PAC/PVCs  . Erectile dysfunction     per urology (prostaglandin injection)  . Kidney cysts 02/2014    bilateral (renal US)  . CKD (chronic kidney disease) stage 3, GFR 30-59 ml/min 03/11/2012    Renal US 02/2014 - multiple kidney cysts   . TIA (transient ischemic attack)   . Anemia     Past Surgical History  Procedure Laterality Date  .  Prostate surgery  02/2009    PVP (laser)  . Tonsillectomy and adenoidectomy  ~ 1945  . Hemorrhoid surgery  1991  . Cataract extraction w/ intraocular lens implant  11//2012 - 08/2011  . Inguinal hernia repair  1981; 2002    left, then repaired  . Skin cancer excision  2002    bridge of nose, BCC  . Colonoscopy  05/2011    normal per pt (Dr. [name], [place], [name])  . Mri brain  04/2010    WNL  . Esophagogastroduodenoscopy  2006    duodenitis, medual HH, Grade 2 reflux, mild gastritis  . Colonoscopy  03/2011    poor prep, unable to biopsy polyp in tic fold, rpt 3 mo [name])  . Abi  2007    WNL  . Urethral dilation  2014    tannenbaum  . Cystoscopy with urethral dilatation N/A 07/24/2013    Procedure: CYSTOSCOPY WITH URETHRAL DILATATION;  Surgeon: [name], MD;  Location: [facility];  Service: Urology;  Laterality: N/A;  . Transurethral incision of prostate N/A 07/24/2013    Procedure: TRANSURETHRAL INCISION OF THE PROSTATE (TUIP);  Surgeon: [name], MD;  Location: [facility];  Service: Urology;  Laterality: N/A;  . Esophagogastroduodenoscopy  07/2011    mild chronic gastritis  .  Colonoscopy  06/2011    mult polyps adenomatous, severe diverticulosis, rpt 3 yrs [name])  . Carotid endarterectomy    . Cardiac catheterization  2004    normal; [facility]  . Cardiovascular stress test  02/2014    WNL, low risk scan, EF 68%    There were no vitals filed for this visit.  Visit Diagnosis:  Range of motion deficit  Lack of coordination due to stroke  Generalized weakness  Hypertonia      Subjective Assessment - 12/24/14 1252    Pertinent History see epic snapshot;  pt with H/o of CVA   Patient Stated Goals I have made alot of progress but would like to be able to do even more.   Currently in Pain? No/denies        Treatment: Pt arrived wearing oval-8 splints on right middle and ring fingers. Pt was noted to have increased edema and oval 8 for middle finger  appeared to be a little tight. Therapist fitted pt with a size larger oval 8 wrapped with coban(size 14) and pt practiced using RUE functionally while wearing it. Pt reported increased comfort. Therapist reviewed previously issued exercises for IP flexion/ extension while wearing splints and MP flexion/ composite flexion. Pt performed functional grasp /release of  dowel pegs With min difficulty, and min v.c. to avoid compensation. Pt reports falling while tending to his garden. Therapist recommended pt consider a portable seat to prevent future falls. (Pt reports he was unharmed). Therapist discussed the importance of wearing splints and avoiding positioning that is harmful to his joints/ promotes deformity.                     OT Education - 12/24/14 1302    Education provided Yes   Education Details oval 8 splint wear, care and prec. Wearing size 14 when middle finger is swollen and avoiding night time wear.   Person(s) Educated Patient   Methods Explanation;Demonstration   Comprehension Verbalized understanding          OT Short Term Goals - 12/08/14 1308    OT SHORT TERM GOAL #1   Title Pt will be I with HEP - 01/05/2015   Status New   OT SHORT TERM GOAL #2   Title Pt will demonstrate understanding of wear and care of splint for R hand.   Status New           OT Long Term Goals - 12/08/14 1309    OT LONG TERM GOAL #1   Title Pt will be I with upgraded HEP prn - 02/02/2015   Status New   OT LONG TERM GOAL #2   Title Pt will demonstrate understanding of joint protection strategies.    Status New               Plan - 12/24/14 1253    Clinical Impression Statement Pt is progressing towards goals for HEP and splint wear.   Plan splint check, check goals in next 1-2 visits and d/c until after botox   OT Home Exercise Plan oval 8 size 13 for middle, size 10 for ring, size 14 for middle when hand swells   Consulted and Agree with Plan of Care Patient         Problem List Patient Active Problem List   Diagnosis Date Noted  . Spastic hemiparesis affecting dominant side 07/07/2014  . Right leg swelling 06/09/2014  . Left shoulder pain 03/16/2014  . Chest pressure 02/03/2014  .  Spastic hemiplegia affecting dominant side 11/07/2013  . Medicare annual wellness visit, subsequent 11/06/2013  . Erectile dysfunction due to arterial insufficiency 02/05/2013  . Depression   . Palpitations 06/26/2012  . HLD (hyperlipidemia)   . Restless leg syndrome   . GERD (gastroesophageal reflux disease)   . BPH with obstruction/lower urinary tract symptoms   . CVA (cerebrovascular accident) 03/11/2012  . Right hemiparesis 03/11/2012  . Type 2 diabetes, controlled, with neuropathy 03/11/2012  . OSA (obstructive sleep apnea) 03/11/2012  . CKD (chronic kidney disease) stage 3, GFR 30-59 ml/min 03/11/2012  . Migraines 03/11/2012    Devany Aja 12/24/2014, 1:03 PM  [facility] [address] [place], [name], [address] Phone: [phone]   Fax:  [phone]

## 2014-12-29 ENCOUNTER — Encounter: Payer: Medicare Other | Admitting: Occupational Therapy

## 2014-12-31 ENCOUNTER — Ambulatory Visit: Payer: Medicare Other | Admitting: Occupational Therapy

## 2014-12-31 DIAGNOSIS — R609 Edema, unspecified: Secondary | ICD-10-CM | POA: Diagnosis not present

## 2014-12-31 DIAGNOSIS — I69898 Other sequelae of other cerebrovascular disease: Secondary | ICD-10-CM | POA: Diagnosis not present

## 2014-12-31 DIAGNOSIS — R531 Weakness: Secondary | ICD-10-CM | POA: Diagnosis not present

## 2014-12-31 DIAGNOSIS — I6980 Unspecified sequelae of other cerebrovascular disease: Secondary | ICD-10-CM | POA: Diagnosis not present

## 2014-12-31 DIAGNOSIS — M6289 Other specified disorders of muscle: Secondary | ICD-10-CM

## 2014-12-31 DIAGNOSIS — R269 Unspecified abnormalities of gait and mobility: Secondary | ICD-10-CM | POA: Diagnosis not present

## 2014-12-31 DIAGNOSIS — IMO0002 Reserved for concepts with insufficient information to code with codable children: Secondary | ICD-10-CM

## 2014-12-31 DIAGNOSIS — M2560 Stiffness of unspecified joint, not elsewhere classified: Secondary | ICD-10-CM

## 2014-12-31 DIAGNOSIS — R279 Unspecified lack of coordination: Secondary | ICD-10-CM | POA: Diagnosis not present

## 2014-12-31 NOTE — Therapy (Signed)
[facility] [address] [place], [name], [address] Phone: [phone]   Fax:  [phone]  Occupational Therapy Treatment  Patient Details  Name: LAMAJ METOYER MRN: [id number] Date of Birth: [birth year] Referring Provider:Dr. [name]  Encounter Date: 12/31/2014      OT End of Session - 12/31/14 0949    Visit Number 5   Number of Visits 16   Date for OT Re-Evaluation 02/02/15   Authorization Type MCR TRAD, TRICARE - G code needed!   OT Start Time 0840   OT Stop Time 0930   OT Time Calculation (min) 50 min   Activity Tolerance Patient tolerated treatment well   Behavior During Therapy [facility] for tasks assessed/performed      Past Medical History  Diagnosis Date  . HLD (hyperlipidemia)   . Restless leg syndrome     well controlled on mirapex  . OSA on CPAP     sleep study 01/2014 - severe, CPAP at 10, previously on nuvigil (Dr. [name])  . Type 2 diabetes, controlled, with neuropathy 2011  . H/O hiatal hernia   . GERD (gastroesophageal reflux disease)     with sliding HH  . Migraines 03/11/12    now rare  . CVA (cerebral infarction) 03/07/12    slurred speech; right hemplegia, left handed - completed PT 07/2014 (17 sessions)  . Basal cell carcinoma of nose 2002    bridge of nose  . History of kidney problems 12/2010    lacerated left kidney after fall  . BPH with obstruction/lower urinary tract symptoms     failed flomax, uroxatral, myrbetriq - sees urology [name]) pending videourodynamics  . Depression     mild, on cymbalta per prior PCP  . Palpitations 01/2012    holter WNL, rare PAC/PVCs  . Erectile dysfunction     per urology (prostaglandin injection)  . Kidney cysts 02/2014    bilateral (renal US)  . CKD (chronic kidney disease) stage 3, GFR 30-59 ml/min 03/11/2012    Renal US 02/2014 - multiple kidney cysts   . TIA (transient ischemic attack)   . Anemia     Past Surgical History   Procedure Laterality Date  . Prostate surgery  02/2009    PVP (laser)  . Tonsillectomy and adenoidectomy  ~ 1945  . Hemorrhoid surgery  1991  . Cataract extraction w/ intraocular lens implant  11//2012 - 08/2011  . Inguinal hernia repair  1981; 2002    left, then repaired  . Skin cancer excision  2002    bridge of nose, BCC  . Colonoscopy  05/2011    normal per pt (Dr. [name], [place], [name])  . Mri brain  04/2010    WNL  . Esophagogastroduodenoscopy  2006    duodenitis, medual HH, Grade 2 reflux, mild gastritis  . Colonoscopy  03/2011    poor prep, unable to biopsy polyp in tic fold, rpt 3 mo [name])  . Abi  2007    WNL  . Urethral dilation  2014    tannenbaum  . Cystoscopy with urethral dilatation N/A 07/24/2013    Procedure: CYSTOSCOPY WITH URETHRAL DILATATION;  Surgeon: [name], MD;  Location: [facility];  Service: Urology;  Laterality: N/A;  . Transurethral incision of prostate N/A 07/24/2013    Procedure: TRANSURETHRAL INCISION OF THE PROSTATE (TUIP);  Surgeon: [name], MD;  Location: [facility];  Service: Urology;  Laterality: N/A;  . Esophagogastroduodenoscopy  07/2011  mild chronic gastritis  . Colonoscopy  06/2011    mult polyps adenomatous, severe diverticulosis, rpt 3 yrs [name])  . Carotid endarterectomy    . Cardiac catheterization  2004    normal; [facility]  . Cardiovascular stress test  02/2014    WNL, low risk scan, EF 68%    There were no vitals filed for this visit.  Visit Diagnosis:  Lack of coordination due to stroke  Range of motion deficit  Generalized weakness  Hypertonia      Subjective Assessment - Jan 23, 2015 0841    Subjective  Pt reports larger splint is working well.   Pertinent History see epic snapshot;  pt with H/o of CVA   Patient Stated Goals I have made alot of progress but would like to be able to do even more.   Currently in Pain? No/denies   Multiple Pain Sites No         Treatment: Therapist checked  progress towards goals.Pt reports oval 8 splints are fitting well. Pt wears larger size oval o on middle finger if digits are swollen.  Pt performed previously issued HEP for MP/Composite finger flexion and PIP flexion, with blocking for ring and middle digits 10-15 reps each.  Pt performed functional grasp release of checkers, medicine bottles and flipped playing cards with min v.c. to avoid compensation. Pt requests to place OT on hold until approximately  1 month following his visit with Dr. Letta Pate for possible botox injection.                  OT Education - 01-23-2015 [phone]    Education Details Updates to HEP with functional use, joint protection   Person(s) Educated Patient   Methods Explanation;Demonstration   Comprehension Verbalized understanding          OT Short Term Goals - 23-Jan-2015 0842    OT SHORT TERM GOAL #1   Title Pt will be I with HEP - 01/05/2015   Status Achieved   OT SHORT TERM GOAL #2   Title Pt will demonstrate understanding of wear and care of splint for R hand.   Status Achieved   OT SHORT TERM GOAL #3   Title --           OT Long Term Goals - 2015/01/23 0854    OT LONG TERM GOAL #1   Title Pt will be I with upgraded HEP prn - 02/02/2015   Status Achieved   OT LONG TERM GOAL #2   Title Pt will demonstrate understanding of joint protection strategies.    Status Achieved               Plan - 23-Jan-2015 0950    Clinical Impression Statement Pt demonstrates understanding of splint wear/ care and updated HEP. Pt requests to discharge until  after botox injection in May by Dr. Letta Pate.   Rehab Potential Good   OT Frequency 2x / week   OT Duration 8 weeks   OT Treatment/Interventions Self-care/ADL training;Cryotherapy;Electrical Stimulation;Moist Heat;Ultrasound;Fluidtherapy;Iontophoresis;Therapeutic exercise;Manual Therapy;Passive range of motion;Therapeutic activities;Splinting;Patient/family education   Plan Pt to be discharged  until after his appointment with Dr. Letta Pate in May for possible botox.   OT Home Exercise Plan oval 8 size 13 for middle, size 10 for ring, size 14 for middle when hand swells   Consulted and Agree with Plan of Care Patient          G-Codes - 23-Jan-2015 0955    Functional Assessment Tool Used Box and  blocks R=13   Functional Limitation Carrying, moving and handling objects   Carrying, Moving and Handling Objects Goal Status [phone]) At least 40 percent but less than 60 percent impaired, limited or restricted   Carrying, Moving and Handling Objects Discharge Status [phone]) At least 60 percent but less than 80 percent impaired, limited or restricted      Problem List Patient Active Problem List   Diagnosis Date Noted  . Spastic hemiparesis affecting dominant side 07/07/2014  . Right leg swelling 06/09/2014  . Left shoulder pain 03/16/2014  . Chest pressure 02/03/2014  . Spastic hemiplegia affecting dominant side 11/07/2013  . Medicare annual wellness visit, subsequent 11/06/2013  . Erectile dysfunction due to arterial insufficiency 02/05/2013  . Depression   . Palpitations 06/26/2012  . HLD (hyperlipidemia)   . Restless leg syndrome   . GERD (gastroesophageal reflux disease)   . BPH with obstruction/lower urinary tract symptoms   . CVA (cerebrovascular accident) 03/11/2012  . Right hemiparesis 03/11/2012  . Type 2 diabetes, controlled, with neuropathy 03/11/2012  . OSA (obstructive sleep apnea) 03/11/2012  . CKD (chronic kidney disease) stage 3, GFR 30-59 ml/min 03/11/2012  . Migraines 03/11/2012   OCCUPATIONAL THERAPY DISCHARGE SUMMARY    Current functional level related to goals / functional outcomes: Pt met his long and short term goals.   Remaining deficits: Spasticity, decreased RUE strength, coordiantion and functional use, swan neck deformity at middle and ringers of right hand   Education / Equipment: Pt was educated regarding: splint wear, care and  precautions, joint protection, and HEP. Pt verbalizes understanding of all education. Pt requests to d/c until after his appointment with Dr. Letta Pate.  Plan: Patient agrees to discharge.  Patient goals were not met. Patient is being discharged due to meeting the stated rehab goals.  ?????     Kingsly Kloepfer 12/31/2014, 10:22 AM Theone Murdoch, OTR/L [phone] Phone: [phone] 10:22 AM 12/31/2014 [facility] [address] [place], [name], [address] Phone: [phone]   Fax:  [phone]

## 2014-12-31 NOTE — Patient Instructions (Signed)
While wearing your ring splints on ring and middle finger, perform the following activities:  Pick up medicine bottles  Pick up checkers off of a stack  Flip playing cards  Be aware of your shoulder position and try to avoid elevating shoulder.  When using your right hand think about grabbing items lightly as if holding an egg you don't want to crush.  Avoid lifting heavy, breakable or hot items with RUE.  If splints are off be aware of right hand position try to avoid positions that promote hyperextension at middle and ring fingers,  also try to grip items more lightly to protect joints.

## 2015-01-02 ENCOUNTER — Other Ambulatory Visit: Payer: Self-pay | Admitting: Family Medicine

## 2015-01-03 ENCOUNTER — Other Ambulatory Visit: Payer: Self-pay | Admitting: Family Medicine

## 2015-01-04 NOTE — Telephone Encounter (Signed)
Ok to refill 

## 2015-01-05 ENCOUNTER — Ambulatory Visit: Payer: Medicare Other | Admitting: Occupational Therapy

## 2015-01-06 DIAGNOSIS — R5383 Other fatigue: Secondary | ICD-10-CM | POA: Diagnosis not present

## 2015-01-07 ENCOUNTER — Encounter: Payer: Medicare Other | Admitting: Occupational Therapy

## 2015-01-07 ENCOUNTER — Telehealth: Payer: Self-pay | Admitting: *Deleted

## 2015-01-07 NOTE — Telephone Encounter (Signed)
Patient called the office and stated that his order for CPAP equipment and supplies was never sent to [facility] his DME.  The order was routed to [facility] and she was requested to make contact with the patient ASAP.

## 2015-01-12 ENCOUNTER — Encounter: Payer: Medicare Other | Admitting: Occupational Therapy

## 2015-01-13 DIAGNOSIS — E1142 Type 2 diabetes mellitus with diabetic polyneuropathy: Secondary | ICD-10-CM | POA: Diagnosis not present

## 2015-01-13 DIAGNOSIS — E785 Hyperlipidemia, unspecified: Secondary | ICD-10-CM | POA: Diagnosis not present

## 2015-01-13 DIAGNOSIS — Z1389 Encounter for screening for other disorder: Secondary | ICD-10-CM | POA: Diagnosis not present

## 2015-01-13 DIAGNOSIS — E039 Hypothyroidism, unspecified: Secondary | ICD-10-CM | POA: Diagnosis not present

## 2015-01-14 ENCOUNTER — Ambulatory Visit: Payer: Medicare Other | Admitting: Neurology

## 2015-01-15 ENCOUNTER — Encounter: Payer: Medicare Other | Admitting: Occupational Therapy

## 2015-01-18 DIAGNOSIS — H524 Presbyopia: Secondary | ICD-10-CM | POA: Diagnosis not present

## 2015-01-18 DIAGNOSIS — H26491 Other secondary cataract, right eye: Secondary | ICD-10-CM | POA: Diagnosis not present

## 2015-01-19 ENCOUNTER — Encounter: Payer: Medicare Other | Admitting: Occupational Therapy

## 2015-01-19 ENCOUNTER — Ambulatory Visit (INDEPENDENT_AMBULATORY_CARE_PROVIDER_SITE_OTHER): Payer: Medicare Other | Admitting: Neurology

## 2015-01-19 ENCOUNTER — Other Ambulatory Visit: Payer: Self-pay | Admitting: Family Medicine

## 2015-01-19 ENCOUNTER — Encounter: Payer: Self-pay | Admitting: Neurology

## 2015-01-19 VITALS — BP 118/68 | HR 62 | Resp 16 | Ht 68.0 in | Wt 188.0 lb

## 2015-01-19 DIAGNOSIS — G4733 Obstructive sleep apnea (adult) (pediatric): Secondary | ICD-10-CM

## 2015-01-19 DIAGNOSIS — G8190 Hemiplegia, unspecified affecting unspecified side: Secondary | ICD-10-CM | POA: Diagnosis not present

## 2015-01-19 DIAGNOSIS — I639 Cerebral infarction, unspecified: Secondary | ICD-10-CM | POA: Diagnosis not present

## 2015-01-19 DIAGNOSIS — E7849 Other hyperlipidemia: Secondary | ICD-10-CM | POA: Insufficient documentation

## 2015-01-19 DIAGNOSIS — Z9989 Dependence on other enabling machines and devices: Principal | ICD-10-CM

## 2015-01-19 DIAGNOSIS — G8191 Hemiplegia, unspecified affecting right dominant side: Secondary | ICD-10-CM

## 2015-01-19 NOTE — Progress Notes (Signed)
Subjective:    Patient ID: James Bell is a 81 y.o. male.  HPI     Interim history:  Dr. Victory is a very pleasant 81 year-old right-handed gentleman - retired ophthalmologist - with an underlying medical history of hyperlipidemia, restless leg syndrome, diabetes, reflux disease, migraines, stroke with residual right-sided hemiplegia in June 2013 for which he receives Botox injections in the right upper extremity, basal cell carcinoma of the nose as well as a prior diagnosis of obstructive sleep apnea on CPAP, who presents for followup consultation of his obstructive sleep apnea. The patient is unaccompanied today. I last saw him a year ago on 01/08/2014, at which time I suggested we bring him back for sleep study. He had a split-night sleep study on 01/13/2014 and we talked about his sleep test results and based on the test results I had prescribed CPAP therapy for him. His sleep efficiency at baseline was 69.9% with a sleep latency of 15 minutes and wake after sleep onset of 21 minutes with severe sleep fragmentation noted. He had a markedly elevated arousal index. He had absence of slow-wave sleep and absence of REM sleep prior to CPAP. He had rare PVCs and PACs on EKG. He had no PLMS. He had mild intermittent snoring. He had a total AHI of 58.9 per hour, baseline oxygen saturation was 94%, nadir was 85% during non-REM sleep. He was titrated on CPAP therapy for the rest of the night. Sleep efficiency was 70.1%. Wake after sleep onset was 79.5 minutes with mild to moderate sleep fragmentation noted. He achieved slow-wave sleep and REM sleep after CPAP. He was titrated from 5-8 cm. His AHI was 0 per hour on a pressure of 8. Supine REM sleep was achieved. Based on the test results are prescribed CPAP therapy at home. Of note, he no showed for an appointment on 01/14/2015.  Today, 01/19/2015: He reports that he has been active in the yard lately. He is compliant with CPAP. He would like to stay at  9 cm which was his last pressure. I prescribed a pressure of 8 cm, based on his last sleep. He is exercising regularly. He receives Botox injections for his poststroke spasticity under Dr. Letta Pate with great results. His right upper extremity mobility and range of motion have improved tremendously. His walking is better. He uses a cane on the left side for safety.  Previously:  I first met him on 04/04/2013 at which time I suggested an autoPAP trial for 30 days. His old CPAP machine was set for 10 cm, but was defective and he bought a new machine on his own, which was set at 7 cm, then increased to 9 cm, as I understand.   His typical bedtime is reported to be around 9 to 10 PM and usual wake time is around 5 AM. Sleep onset typically occurs within a few minutes. He reports feeling not well rested upon awakening. He wakes up on an average 2 in the middle of the night and has to go to the bathroom 2 times on a typical night. He denies recent morning headaches.   He reports excessive daytime somnolence (EDS) and His Epworth Sleepiness Score (ESS) is 15/24 today. He has been known to snore for the past many years. He had 2 sleep studies in the past and he has records today, which I reviewed: He had a baseline sleep study on 12/26/2007 at [facility] in [place], [place]. His overall AHI was 40 per hour.  His oxygen nadir was 78%. Moderate to severe sleep fragmentation was noted and an increased arousal index. He had periodic leg movements of sleep. He had a CPAP titration study on 05/21/2008. He had periodic leg movements of sleep, oxygen nadir was 92% for the night.   He feels that the pressure is not adequate as he does not wake up rested and has daytime somnolence. He feels that he may be snoring despite using CPAP. He had another machine which could not be fixed. He goes to the gym and drives a car. He lives alone and cooks for himself. He uses a 4 prong cane on the left side to  mobilize. He is up for Botox injections in the right upper extremity.   His Past Medical History Is Significant For: Past Medical History  Diagnosis Date  . HLD (hyperlipidemia)   . Restless leg syndrome     well controlled on mirapex  . OSA on CPAP     sleep study 01/2014 - severe, CPAP at 10, previously on nuvigil (Dr. [name])  . Type 2 diabetes, controlled, with neuropathy 2011  . H/O hiatal hernia   . GERD (gastroesophageal reflux disease)     with sliding HH  . Migraines 03/11/12    now rare  . CVA (cerebral infarction) 03/07/12    slurred speech; right hemplegia, left handed - completed PT 07/2014 (17 sessions)  . Basal cell carcinoma of nose 2002    bridge of nose  . History of kidney problems 12/2010    lacerated left kidney after fall  . BPH with obstruction/lower urinary tract symptoms     failed flomax, uroxatral, myrbetriq - sees urology [name]) pending videourodynamics  . Depression     mild, on cymbalta per prior PCP  . Palpitations 01/2012    holter WNL, rare PAC/PVCs  . Erectile dysfunction     per urology (prostaglandin injection)  . Kidney cysts 02/2014    bilateral (renal US)  . CKD (chronic kidney disease) stage 3, GFR 30-59 ml/min 03/11/2012    Renal US 02/2014 - multiple kidney cysts   . TIA (transient ischemic attack)   . Anemia     His Past Surgical History Is Significant For: Past Surgical History  Procedure Laterality Date  . Prostate surgery  02/2009    PVP (laser)  . Tonsillectomy and adenoidectomy  ~ 1945  . Hemorrhoid surgery  1991  . Cataract extraction w/ intraocular lens implant  11//2012 - 08/2011  . Inguinal hernia repair  1981; 2002    left, then repaired  . Skin cancer excision  2002    bridge of nose, [facility]CC  . Colonoscopy  05/2011    normal per pt (Dr. [name], [place], [name])  . Mri brain  04/2010    WNL  . Esophagogastroduodenoscopy  2006    duodenitis, medual HH, Grade 2 reflux, mild gastritis  . Colonoscopy  03/2011     poor prep, unable to biopsy polyp in tic fold, rpt 3 mo [name])  . Abi  2007    WNL  . Urethral dilation  2014    tannenbaum  . Cystoscopy with urethral dilatation N/A 07/24/2013    Procedure: CYSTOSCOPY WITH URETHRAL DILATATION;  Surgeon: [name], MD;  Location: [facility];  Service: Urology;  Laterality: N/A;  . Transurethral incision of prostate N/A 07/24/2013    Procedure: TRANSURETHRAL INCISION OF THE PROSTATE (TUIP);  Surgeon: [name], MD;  Location: [facility];  Service: Urology;  Laterality: N/A;  . Esophagogastroduodenoscopy  07/2011    mild chronic gastritis  . Colonoscopy  06/2011    mult polyps adenomatous, severe diverticulosis, rpt 3 yrs [name])  . Carotid endarterectomy    . Cardiac catheterization  2004    normal; [place] [facility]  . Cardiovascular stress test  02/2014    WNL, low risk scan, EF 68%    His Family History Is Significant For: Family History  Problem Relation Age of Onset  . Stroke Father   . Lung cancer Father 42     smoker  . Stroke Brother   . Diabetes Brother 75  . Coronary artery disease Neg Hx   . Lung cancer Brother   . Alcoholism Sister   . Kidney disease Sister     His Social History Is Significant For: History   Social History  . Marital Status: Unknown    Spouse Name: N/A  . Number of Children: 2  . Years of Education: MD   Occupational History  . retired   .     Social History Main Topics  . Smoking status: Never Smoker   . Smokeless tobacco: Never Used     Comment: "used to smoke pipe when I was younger"  . Alcohol Use: 2.4 oz/week    2 Glasses of wine, 2 Shots of liquor per week     Comment: occasional  . Drug Use: No  . Sexual Activity: Yes   Other Topics Concern  . None   Social History Narrative   Caffeine: 1 cup coffee/day   Divorced   Occupation: ophthalmologist   Edu: MD   Activity: rehab s/p CVA   Diet: some water, fruits/vegetables daily   POA - [name] (Daughter)    His Allergies  Are:  Allergies  Allergen Reactions  . Lisinopril Cough    tachycardia  . Nexium [Esomeprazole] Other (See Comments)    Headache  . Niacin And Related Other (See Comments)    flushing  :   His Current Medications Are:  Outpatient Encounter Prescriptions as of 01/19/2015  Medication Sig  . atorvastatin (LIPITOR) 40 MG tablet TAKE 1 TABLET DAILY  . clopidogrel (PLAVIX) 75 MG tablet Take 1 tablet (75 mg total) by mouth daily. NEED OFFICE VISIT FOR MORE REFILLS  . Coenzyme Q10 (COQ10) 150 MG CAPS Take 1 capsule by mouth 2 (two) times daily.  . DULoxetine (CYMBALTA) 60 MG capsule TAKE 1 CAPSULE DAILY  . Folic Acid-Vit Y7-[facility]K H57 (FOLBEE) 2.5-25-1 MG TABS tablet Take 1 tablet by mouth daily.  . Insulin Glargine (LANTUS SOLOSTAR) 100 UNIT/ML Solostar Pen Inject 60 Units into the skin at bedtime. (Patient taking differently: Inject 50 Units into the skin at bedtime. )  . losartan (COZAAR) 25 MG tablet Take 1 tablet (25 mg total) by mouth daily.  . metoprolol tartrate (LOPRESSOR) 25 MG tablet TAKE 1 TABLET TWICE A DAY  . NITROSTAT 0.4 MG SL tablet DISSOLVE 1 TABLET UNDER THE TONGUE EVERY 5 MINUTES AS NEEDED FOR CHEST PAIN  . NOVOFINE 30G X 8 MM MISC USE ONE DAILY AS DIRECTED  . omega-3 acid ethyl esters (LOVAZA) 1 G capsule TAKE 2 CAPSULES (2 GRAMS TOTAL) TWICE A DAY  . omeprazole (PRILOSEC) 40 MG capsule TAKE 1 CAPSULE TWICE A DAY AS NEEDED  . pramipexole (MIRAPEX) 1 MG tablet Take 1 tablet (1 mg total) by mouth at bedtime. NEED OFFICE VISIT FOR MORE REFILLS  . sitaGLIPtin-metformin (JANUMET) 50-1000 MG per tablet Take 1 tablet by  mouth 2 (two) times daily with a meal.  . vitamin C (ASCORBIC ACID) 500 MG tablet Take 1,000 mg by mouth daily.   . diazepam (VALIUM) 5 MG tablet Take 1 tablet (5 mg total) by mouth every 8 (eight) hours as needed for anxiety. (Patient not taking: Reported on 01/19/2015)  . [DISCONTINUED] cetirizine (ZYRTEC) 10 MG tablet Take 10 mg by mouth as needed for allergies.  .  [DISCONTINUED] NITROSTAT 0.4 MG SL tablet PLACE 1 TABLET UNDER THE TONGUE EVERY 5 MINUTES AS NEEDED FOR CHEST PAIN  . [DISCONTINUED] traZODone (DESYREL) 50 MG tablet TAKE ONE-HALF (1/2) TABLET ONCE DAILY AT BEDTIME   No facility-administered encounter medications on file as of 01/19/2015.  :  Review of Systems:  Out of a complete 14 point review of systems, all are reviewed and negative with the exception of these symptoms as listed below:   Review of Systems  Neurological:       Daytime tiredness, sleep apnea, RLS, snores without CPAP.     Objective:  Neurologic Exam  Physical Exam Physical Examination:   Filed Vitals:   01/19/15 1100  BP: 118/68  Pulse: 62  Resp: 16   General Examination: The patient is a very pleasant 81 y.o. male in no acute distress. He appears well-developed and well-nourished and well groomed.   HEENT: Normocephalic, atraumatic, pupils are equal, round and reactive to light and accommodation. Extraocular tracking is good without limitation to gaze excursion or nystagmus noted. Normal smooth pursuit is noted. Hearing is grossly intact with hearing aid in place. Face is asymmetric with mild R lower facial weakness. Speech is minimally dysarthric and hypophonic. There is no lip, neck/head, jaw or voice tremor. Neck is supple with full range of passive and active motion. There are no carotid bruits on auscultation. Oropharynx exam reveals: mild mouth dryness, adequate dental hygiene and mild airway crowding.   Chest: Clear to auscultation without wheezing, rhonchi or crackles noted.  Heart: S1+S2+0, regular and normal without murmurs, rubs or gallops noted.   Abdomen: Soft, non-tender and non-distended with normal bowel sounds appreciated on auscultation.  Extremities: There is 1+ pitting edema in the R distal lower extremity, new. Pedal pulses are intact.   Skin: Warm and dry without trophic changes noted. There are no varicose veins.  Musculoskeletal: exam  reveals no obvious joint deformities, tenderness or joint swelling or erythema.   Neurologically:  Mental status: The patient is awake, alert and oriented in all 4 spheres. His memory, attention, language and knowledge are appropriate. There is no aphasia, agnosia, apraxia or anomia. Speech is clear with normal prosody and enunciation. Thought process is linear. Mood is congruent and affect is normal.  Cranial nerves are as described above under HEENT exam. In addition, shoulder shrug is normal with equal shoulder height noted. Motor exam: Normal bulk, strength and tone is noted on the L. On the R, there is 4/5 weakness in the RUE with increase in tone and 4/5 weakness in the RLE with spasticity noted, improved.  Sensory exam is intact to light touch, pinprick, vibration, temperature sense.  Gait, station and balance: he stands with mild difficulty and uses a 4 prong cane on the L with mild circumduction on the RLE and flexion of his RUE, but overall improved gait. .    Assessment and Plan:   In summary, URIAS SHEEK is a very pleasant 81 year old male with a history of Diabetes, migraine headaches, restless leg syndrome with PLMs, stroke in June 2013  with residual right-sided weakness, as well as a prior diagnosis of severe obstructive sleep apnea who has been using a CPAP machine that he bought himself set at a pressure of 9 cm in the interim, I saw him back a year ago and ordered another sleep study. He had a split-night sleep study in April 2015 which we reviewed today. He did well with CPAP of 8 cm which I prescribed but subjectively he felt better when he was using CPAP of 9 cm. I would be happy to make that change. He would like to switch his DME companies as well. He would be eligible for a new machine as I understand. I will prescribe a new CPAP machine with a setting of 9 cm, utilizing a nasal pillows interface. His physical exam is stable. Neurologically he has improved range of motion  and improved spasticity on the right side. I placed an order for new CPAP. [facility] I would like to see him back in 6 months, sooner if the need arises. He was in agreement.

## 2015-01-19 NOTE — Patient Instructions (Signed)
Please continue using your CPAP regularly. While your insurance requires that you use CPAP at least 4 hours each night on 70% of the nights, I recommend, that you not skip any nights and use it throughout the night if you can. Getting used to CPAP and staying with the treatment long term does take time and patience and discipline. Untreated obstructive sleep apnea when it is moderate to severe can have an adverse impact on cardiovascular health and raise her risk for heart disease, arrhythmias, hypertension, congestive heart failure, stroke and diabetes. Untreated obstructive sleep apnea causes sleep disruption, nonrestorative sleep, and sleep deprivation. This can have an impact on your day to day functioning and cause daytime sleepiness and impairment of cognitive function, memory loss, mood disturbance, and problems focussing. Using CPAP regularly can improve these symptoms. I will send an order for a new machine to [facility] today. Keep up the good work with using your machine! I will see you back in 6 months for sleep apnea check up, and if you continue to do well on CPAP I will see you once a year thereafter.

## 2015-01-21 ENCOUNTER — Encounter: Payer: Medicare Other | Attending: Physical Medicine & Rehabilitation

## 2015-01-21 ENCOUNTER — Encounter: Payer: Medicare Other | Admitting: Occupational Therapy

## 2015-01-21 ENCOUNTER — Ambulatory Visit (HOSPITAL_BASED_OUTPATIENT_CLINIC_OR_DEPARTMENT_OTHER): Payer: Medicare Other | Admitting: Physical Medicine & Rehabilitation

## 2015-01-21 ENCOUNTER — Encounter: Payer: Self-pay | Admitting: Physical Medicine & Rehabilitation

## 2015-01-21 VITALS — BP 124/50 | HR 80 | Resp 16

## 2015-01-21 DIAGNOSIS — G8110 Spastic hemiplegia affecting unspecified side: Secondary | ICD-10-CM | POA: Diagnosis not present

## 2015-01-21 DIAGNOSIS — M7989 Other specified soft tissue disorders: Secondary | ICD-10-CM | POA: Insufficient documentation

## 2015-01-21 DIAGNOSIS — G8111 Spastic hemiplegia affecting right dominant side: Secondary | ICD-10-CM | POA: Diagnosis not present

## 2015-01-21 NOTE — Patient Instructions (Addendum)

## 2015-01-21 NOTE — Progress Notes (Signed)
Phenol neurolysis of the Right musculo- cutaneous nerve  Indication: Severe spasticity in the arm flexor muscles which is not responding to medical management and other conservative care and interfering with functional use and hygiene.  Attempted musculocutaneous neural lysis. Biceps twitch obtained with external stimulator however once the 22-gauge 40 mm needle electrode was inserted no biceps twitch was obtained even at 8 mA. Therefore procedure was aborted.  Instead following procedure was performed  Botox Injection for spasticity using needle EMG guidance  Dilution: 50 Units/ml Indication: Severe spasticity which interferes with ADL,mobility and/or  hygiene and is unresponsive to medication management and other conservative care Informed consent was obtained after describing risks and benefits of the procedure with the patient. This includes bleeding, bruising, infection, excessive weakness, or medication side effects. A REMS form is on file and signed. Needle: 25g 2" needle electrode Number of units per muscle  Biceps25 units 4 sites, 2  Medial, 2 lateral  All injections were done after obtaining appropriate EMG activity and after negative drawback for blood. The patient tolerated the procedure well. Post procedure instructions were given. A followup appointment was made.    Occupational therapy was ordered to work on elbow flexor spasticity, specifically he has increased elbow flexor tone with activities such as walking, in addition he has right shoulder decreased range of motion, contracture, and adhesive capsulitis, they can work on that as well as focusing on functional usage

## 2015-01-26 ENCOUNTER — Telehealth: Payer: Self-pay | Admitting: *Deleted

## 2015-01-26 ENCOUNTER — Encounter: Payer: Medicare Other | Admitting: Occupational Therapy

## 2015-01-26 NOTE — Telephone Encounter (Signed)
Called and left message regarding a back and knee support for this patient. Would like verbal orders... Did not leave name of company [phone] - not sure if this was ordered by doctor or seen on TV

## 2015-01-27 NOTE — Telephone Encounter (Signed)
Please check into this.  Thank you.

## 2015-01-28 ENCOUNTER — Encounter: Payer: Medicare Other | Admitting: Occupational Therapy

## 2015-01-28 NOTE — Telephone Encounter (Signed)
Received call from [facility]-- She is calling back about this back and knee support.  I am forwardng to Dr Letta Pate to see if he knows anything about this.  They have called a couple of times.

## 2015-01-29 NOTE — Telephone Encounter (Signed)
This is something I did not order and will not sign

## 2015-02-02 ENCOUNTER — Encounter: Payer: Medicare Other | Admitting: Occupational Therapy

## 2015-02-03 ENCOUNTER — Encounter: Payer: Medicare Other | Admitting: Occupational Therapy

## 2015-02-16 ENCOUNTER — Encounter: Payer: Self-pay | Admitting: Occupational Therapy

## 2015-02-16 ENCOUNTER — Ambulatory Visit: Payer: Medicare Other | Attending: Physical Medicine & Rehabilitation | Admitting: Occupational Therapy

## 2015-02-16 ENCOUNTER — Telehealth: Payer: Self-pay | Admitting: Family Medicine

## 2015-02-16 DIAGNOSIS — R531 Weakness: Secondary | ICD-10-CM | POA: Diagnosis not present

## 2015-02-16 DIAGNOSIS — I6980 Unspecified sequelae of other cerebrovascular disease: Secondary | ICD-10-CM | POA: Diagnosis not present

## 2015-02-16 DIAGNOSIS — M25511 Pain in right shoulder: Secondary | ICD-10-CM

## 2015-02-16 DIAGNOSIS — R609 Edema, unspecified: Secondary | ICD-10-CM | POA: Insufficient documentation

## 2015-02-16 DIAGNOSIS — M6281 Muscle weakness (generalized): Secondary | ICD-10-CM

## 2015-02-16 DIAGNOSIS — R269 Unspecified abnormalities of gait and mobility: Secondary | ICD-10-CM | POA: Diagnosis not present

## 2015-02-16 DIAGNOSIS — I69951 Hemiplegia and hemiparesis following unspecified cerebrovascular disease affecting right dominant side: Secondary | ICD-10-CM

## 2015-02-16 DIAGNOSIS — IMO0002 Reserved for concepts with insufficient information to code with codable children: Secondary | ICD-10-CM

## 2015-02-16 DIAGNOSIS — R279 Unspecified lack of coordination: Secondary | ICD-10-CM | POA: Diagnosis not present

## 2015-02-16 DIAGNOSIS — I69898 Other sequelae of other cerebrovascular disease: Secondary | ICD-10-CM | POA: Insufficient documentation

## 2015-02-16 NOTE — Therapy (Signed)
[facility] [address] [place], [name], [address] Phone: [phone]   Fax:  [phone]  Occupational Therapy Evaluation  Patient Details  Name: James Bell MRN: [id number] Date of Birth: June 08, 1931 Referring Provider:  [name], MD  Encounter Date: 02/16/2015      OT End of Session - 02/16/15 1209    Visit Number 1   Number of Visits 16   Date for OT Re-Evaluation 04/13/15   Authorization Type MCR trad, tricare - G code needed   OT Start Time 1100   OT Stop Time 1143   OT Time Calculation (min) 43 min   Activity Tolerance Patient tolerated treatment well      Past Medical History  Diagnosis Date  . HLD (hyperlipidemia)   . Restless leg syndrome     well controlled on mirapex  . OSA on CPAP     sleep study 01/2014 - severe, CPAP at 10, previously on nuvigil (Dr. [name])  . Type 2 diabetes, controlled, with neuropathy 2011  . H/O hiatal hernia   . GERD (gastroesophageal reflux disease)     with sliding HH  . Migraines 03/11/12    now rare  . CVA (cerebral infarction) 03/07/12    slurred speech; right hemplegia, left handed - completed PT 07/2014 (17 sessions)  . Basal cell carcinoma of nose 2002    bridge of nose  . History of kidney problems 12/2010    lacerated left kidney after fall  . BPH with obstruction/lower urinary tract symptoms     failed flomax, uroxatral, myrbetriq - sees urology [name]) pending videourodynamics  . Depression     mild, on cymbalta per prior PCP  . Palpitations 01/2012    holter WNL, rare PAC/PVCs  . Erectile dysfunction     per urology (prostaglandin injection)  . Kidney cysts 02/2014    bilateral (renal US)  . CKD (chronic kidney disease) stage 3, GFR 30-59 ml/min 03/11/2012    Renal US 02/2014 - multiple kidney cysts   . TIA (transient ischemic attack)   . Anemia     Past Surgical History  Procedure Laterality Date  . Prostate surgery   02/2009    PVP (laser)  . Tonsillectomy and adenoidectomy  ~ 1945  . Hemorrhoid surgery  1991  . Cataract extraction w/ intraocular lens implant  11//2012 - 08/2011  . Inguinal hernia repair  1981; 2002    left, then repaired  . Skin cancer excision  2002    bridge of nose, [facility]  . Colonoscopy  05/2011    normal per pt (Dr. [name], [place], [name])  . Mri brain  04/2010    WNL  . Esophagogastroduodenoscopy  2006    duodenitis, medual HH, Grade 2 reflux, mild gastritis  . Colonoscopy  03/2011    poor prep, unable to biopsy polyp in tic fold, rpt 3 mo [name])  . Abi  2007    WNL  . Urethral dilation  2014    tannenbaum  . Cystoscopy with urethral dilatation N/A 07/24/2013    Procedure: CYSTOSCOPY WITH URETHRAL DILATATION;  Surgeon: [name], MD;  Location: [facility];  Service: Urology;  Laterality: N/A;  . Transurethral incision of prostate N/A 07/24/2013    Procedure: TRANSURETHRAL INCISION OF THE PROSTATE (TUIP);  Surgeon: [name], MD;  Location: [facility];  Service: Urology;  Laterality: N/A;  . Esophagogastroduodenoscopy  07/2011    mild chronic gastritis  . Colonoscopy  06/2011    mult polyps adenomatous, severe diverticulosis, rpt 3 yrs [name])  . Carotid endarterectomy    . Cardiac catheterization  2004    normal; [facility]  . Cardiovascular stress test  02/2014    WNL, low risk scan, EF 68%    There were no vitals filed for this visit.  Visit Diagnosis:  Hemiplegia affecting right side in right-dominant patient as late effect of cerebrovascular disease - Plan: Ot plan of care cert/re-cert  Pain in joint, shoulder region, right - Plan: Ot plan of care cert/re-cert  Lack of coordination due to stroke - Plan: Ot plan of care cert/re-cert  Muscle weakness - Plan: Ot plan of care cert/re-cert      Subjective Assessment - 02/16/15 1107    Subjective  I am here to work on my arm from the stroke.   Pertinent History see epic snapshot;  pt with H/o of CVA    Patient Stated Goals I have made alot of progress but would like to be able to do even more.   Currently in Pain? No/denies           [facility] OT Assessment - 02/16/15 0001    Assessment   Diagnosis L CVA   Onset Date 03/09/12  Pt had botox injection approximately 3 weeks ago to RUE   Prior Therapy PT, OT, ST   Precautions   Precautions None   Restrictions   Weight Bearing Restrictions No   Balance Screen   Has the patient fallen in the past 6 months Yes   How many times? 2  pt reports he has worked with PT in the past year   [place] expects to be discharged to: Private residence   Living Arrangements Alone   Available Help at Discharge Available PRN/intermittently  neighbor   Type of Home --  one story condo   Prior Function   Level of [place]  prior to stroke   ADL   Eating/Feeding Modified independent   Grooming Independent   Upper Body Bathing Modified independent   Lower Body Bathing Modified independent   Upper Body Dressing Independent   Lower Body Dressing Minimal assistance  occassionally for zippng and tying shoes   Toilet Tranfer Modified independent   [place] independent   [place] Transfer Modified independent   ADL comments Pt has shower seat and hand held shower;  sits and stands intermittently for showering. Pt has not fallen in shower   IADL   Shopping Takes care of all shopping needs independently   Light Housekeeping Performs light daily tasks such as dishwashing, bed making   Meal Prep Plans, prepares and serves adequate meals independently   [place] own vehicle   Medication Management Is responsible for taking medication in correct dosages at correct time   Physiological scientist financial matters independently (budgets, writes checks, pays rent, bills goes to bank), collects and keeps track of  income   Mobility   Mobility Status History of falls   Mobility Status Comments Pt states he is noticing weakness in LLE  and LUE since botox injection. Pt to see PM&R doc in two days.  Pt states he will share this at that appoitnment and I will also email MD.  Pt reports both times he caught his left foot on ledge going from garage into house.    Written Expression   Dominant Hand  Right   Handwriting Not legible   Vision - History   Baseline Vision No visual deficits   Activity Tolerance   Activity Tolerance Tolerate 30+ min activity without fatigue   Cognition   Overall Cognitive Status Within Functional Limits for tasks assessed   Sensation   Light Touch Appears Intact   Hot/Cold Appears Intact   Proprioception Appears Intact   Coordination   Fine Motor Movements are Fluid and Coordinated No   9 Hole Peg Test Right;Left   Right 9 Hole Peg Test 4 pegs in 2 minutes. Pt with extremely poor proximal control that impacts his ability to use RUE for fine motor tasks.   Left 9 Hole Peg Test 25.9   Box and Blocks R= 15    Other Pt with siginificant dysmetria with all attempts to use RUE   ROM / Strength   AROM / PROM / Strength AROM;PROM;Strength   AROM   Overall AROM  Deficits   Overall AROM Comments shoulder flexion to approximately 115* with synergy influence above 90*, shoulder abduction limited to about 60* with ynergy influence, supination limited with synergy influence as well as restricted elbow flexion;     PROM   Overall PROM  Deficits   Overall PROM Comments shoulder flexion to about 120* and then pt has pain in shoulder of 2/10, Shoulder abduction to 90* and then pt begins to experience pain.   Strength   Overall Strength Deficits   Overall Strength Comments Pt with 3/5 shoulder flexion, 3+/5 shoulder extension, 3+/5 elbow flexion, 4/5 elbow extension   Hand Function   Right Hand Gross Grasp Impaired   Right Hand Grip (lbs) 15   Left Hand Gross Grasp Functional   Left  Hand Grip (lbs) 70   RUE Tone   RUE Tone Mild  however flexor synergy influence seen with all movements                           OT Short Term Goals - 02/16/15 1155    OT SHORT TERM GOAL #1   Title Pt will be mod I with HEP - 03/16/2015   Status New   OT SHORT TERM GOAL #2   Title Pt will demonstrate ability to complete mid level reach to pick up light object off shelf   Status New   OT SHORT TERM GOAL #3   Title Pt will report no greater than 2/10 pain in R shoulder with shoulder flexion to 130* and abduction to 100* in supine.   Status New   OT SHORT TERM GOAL #4   Title Pt will demonstrate understanding of strategies to compensate for tying and zipping with AE prn   Status New           OT Long Term Goals - 02/16/15 1158    OT LONG TERM GOAL #1   Title Pt will be mod I with upgraded HEP prn - 04/13/2015   Status New   OT LONG TERM GOAL #2   Title Pt will demonstrate ability for overhead reach to obtain light object off shelf   Status New   OT LONG TERM GOAL #3   Title Pt will demonstrate improved coordination as evidenced by completing entire 9 hole peg test in 2 minutes (baseline= 4 pegs)   Status New   OT LONG TERM GOAL #4   Title Pt will be able to write name with AE prn with R hand legibily  Status New   OT LONG TERM GOAL #5   Title Pt will demonstrate ability to use RUE as non dominant for basic ADL' tasks (baseline= gross assist)   Status New   Long Term Additional Goals   Additional Long Term Goals Yes   OT LONG TERM GOAL #6   Title Pt will be able to use RUE in cooking task as non dominant for at least 50% of the task.    Status New               Plan - 18-Feb-2015 December 18, 1200    Clinical Impression Statement Pt is an 81 year old male s/p L CVA in 12-19-11 who had a botox injection in his RUE approximately 3 weeks ago. Pt reports his LUE and his LLE are weaker since shot (pt due to see PM & R doc tomorrow and will report this).  Pt presents  today with the following impairments which impact his ability to complete ADL's and IADL's  with his dominant hand:  R dominant hemiplegia, mild tone with flexor synergy influence, decreased strength, decreased A/PROM, decreased coordination,pain in shoulder, decreased functional use of RUE, decreased balance. Pt will benefit from skilled OT to address these deficits and maximize functional use of RUE. Pt has had two falls in past 3 weeks and therefore would also beneift from a PT eval.   Pt will benefit from skilled therapeutic intervention in order to improve on the following deficits (Retired) Decreased balance;Decreased coordination;Decreased mobility;Decreased range of motion;Decreased strength;Impaired UE functional use;Impaired tone;Impaired flexibility;Pain;Difficulty walking   Rehab Potential Good   Clinical Impairments Affecting Rehab Potential Pt will benefit from skilled OT to address the above deficits.   OT Frequency 2x / week   OT Duration 8 weeks   OT Treatment/Interventions Self-care/ADL training;Moist Heat;DME and/or AE instruction;Neuromuscular education;Therapeutic exercise;Functional Mobility Training;Manual Therapy;Passive range of motion;Therapeutic activities;Balance training;Patient/family education   Plan Initiate HEP, begin addressing tightness and pain in R shoulder   Consulted and Agree with Plan of Care Patient          G-Codes - 02/18/15 Dec 19, 1210    Functional Limitation Carrying, moving and handling objects   Carrying, Moving and Handling Objects Current Status [phone]) At least 60 percent but less than 80 percent impaired, limited or restricted   Carrying, Moving and Handling Objects Goal Status ([id number]) At least 40 percent but less than 60 percent impaired, limited or restricted      Problem List Patient Active Problem List   Diagnosis Date Noted  . Spastic hemiparesis affecting dominant side 07/07/2014  . Right leg swelling 06/09/2014  . Left shoulder pain  03/16/2014  . Chest pressure 02/03/2014  . Spastic hemiplegia affecting dominant side 11/07/2013  . Medicare annual wellness visit, subsequent 11/06/2013  . Erectile dysfunction due to arterial insufficiency 02/05/2013  . Depression   . Palpitations 06/26/2012  . HLD (hyperlipidemia)   . Restless leg syndrome   . GERD (gastroesophageal reflux disease)   . BPH with obstruction/lower urinary tract symptoms   . CVA (cerebrovascular accident) 03/11/2012  . Right hemiparesis 03/11/2012  . Type 2 diabetes, controlled, with neuropathy 03/11/2012  . OSA (obstructive sleep apnea) 03/11/2012  . CKD (chronic kidney disease) stage 3, GFR 30-59 ml/min 03/11/2012  . Migraines 03/11/2012    Quay Burow, OTR/L 2015/02/18, 12:14 PM  [facility] [address] [place], [name], [address] Phone: [phone]   Fax:  [phone]

## 2015-02-16 NOTE — Telephone Encounter (Signed)
Spoke with patient. He was going for consult and possible extraction with Dr. Astrid Drafts tomorrow. Spoke with their office [phone]) and they advised if they require a clearance they will send paperwork over. They have never seen patient before, so they aren't familiar with his hx, but when I mentioned that he was on plavix-they advised that they typically want them to hold that for a few days prior to extraction. So they will do consult and go from there.

## 2015-02-16 NOTE — Telephone Encounter (Signed)
Pt's dentist called wanting clearance for dental procedures. Please call back at [phone], thank you

## 2015-02-18 ENCOUNTER — Encounter: Payer: Medicare Other | Attending: Physical Medicine & Rehabilitation

## 2015-02-18 ENCOUNTER — Ambulatory Visit (HOSPITAL_BASED_OUTPATIENT_CLINIC_OR_DEPARTMENT_OTHER): Payer: Medicare Other | Admitting: Physical Medicine & Rehabilitation

## 2015-02-18 ENCOUNTER — Encounter: Payer: Self-pay | Admitting: Physical Medicine & Rehabilitation

## 2015-02-18 ENCOUNTER — Telehealth: Payer: Self-pay | Admitting: Physical Medicine & Rehabilitation

## 2015-02-18 VITALS — BP 144/63 | HR 65 | Resp 14

## 2015-02-18 DIAGNOSIS — M7989 Other specified soft tissue disorders: Secondary | ICD-10-CM | POA: Insufficient documentation

## 2015-02-18 DIAGNOSIS — I639 Cerebral infarction, unspecified: Secondary | ICD-10-CM

## 2015-02-18 DIAGNOSIS — G8110 Spastic hemiplegia affecting unspecified side: Secondary | ICD-10-CM

## 2015-02-18 NOTE — Patient Instructions (Signed)
Possible botox injection next visit

## 2015-02-18 NOTE — Telephone Encounter (Signed)
Angie with [facility] called about OT order for patient.  I saw in note where he was going to order OT for shoulder but there is no referral in system.  Please enter one so I can sent through to [facility].  Thanks.

## 2015-02-18 NOTE — Telephone Encounter (Signed)
done

## 2015-02-18 NOTE — Progress Notes (Signed)
Subjective:    Patient ID: James Bell, male    DOB: 01-Dec-1930, 81 y.o.   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 He has a slight ledge or step going into house that he says "it is just enough to trip me up".  He says that the brace requests we have received are unsolicited calls from a company trying to sell him a knee and ankle brace. He is doing better since botox with his arm and OT may be able to assist him in being able to write again.  He is needing the OT referral placed for his shoulder as the last visit order was placed as PT.  (I have placed the order)  No injury with falls, pt with  Pain Inventory Average Pain 2 Pain Right Now 1 My pain is dull, aching and had a tooth extraction recently  In the last 24 hours, has pain interfered with the following? General activity 4 Relation with others 4 Enjoyment of life 4 What TIME of day is your pain at its worst? daytime Sleep (in general) Fair  Pain is worse with: inactivity Pain improves with: heat/ice and medication Relief from Meds: no answer  Mobility use a cane  Function Do you have any goals in this area?  no  Neuro/Psych No problems in this area  Prior Studies Any changes since last visit?  no  Physicians involved in your care Any changes since last visit?  no   Family History  Problem Relation Age of Onset  . Stroke Father   . Lung cancer Father 34     smoker  . Stroke Brother   . Diabetes Brother 46  . Coronary artery disease Neg Hx   . Lung cancer Brother   . Alcoholism Sister   . Kidney disease Sister    History   Social History  . Marital Status: Unknown    Spouse Name: N/A  . Number of Children: 2  . Years of Education: MD   Occupational History  . retired   .     Social History Main Topics  . Smoking status: Never Smoker   . Smokeless tobacco: Never Used     Comment: "used to smoke pipe when I was younger"  . Alcohol Use: 2.4 oz/week    2  Glasses of wine, 2 Shots of liquor per week     Comment: occasional  . Drug Use: No  . Sexual Activity: Yes   Other Topics Concern  . None   Social History Narrative   Caffeine: 1 cup coffee/day   Divorced   Occupation: ophthalmologist   Edu: MD   Activity: rehab s/p CVA   Diet: some water, fruits/vegetables daily   POA - [name] (Daughter)   Past Surgical History  Procedure Laterality Date  . Prostate surgery  02/2009    PVP (laser)  . Tonsillectomy and adenoidectomy  ~ 1945  . Hemorrhoid surgery  1991  . Cataract extraction w/ intraocular lens implant  11//2012 - 08/2011  . Inguinal hernia repair  1981; 2002    left, then repaired  . Skin cancer excision  2002    bridge of nose, BCC  . Colonoscopy  05/2011    normal per pt (Dr. [name], [place], [name])  . Mri brain  04/2010    WNL  . Esophagogastroduodenoscopy  2006    duodenitis, medual HH, Grade 2 reflux, mild gastritis  . Colonoscopy  03/2011    poor prep, unable to biopsy polyp in tic fold, rpt 3 mo [name])  . Abi  2007    WNL  . Urethral dilation  2014    tannenbaum  . Cystoscopy with urethral dilatation N/A 07/24/2013    Procedure: CYSTOSCOPY WITH URETHRAL DILATATION;  Surgeon: [name], MD;  Location: [facility];  Service: Urology;  Laterality: N/A;  . Transurethral incision of prostate N/A 07/24/2013    Procedure: TRANSURETHRAL INCISION OF THE PROSTATE (TUIP);  Surgeon: [name], MD;  Location: [facility];  Service: Urology;  Laterality: N/A;  . Esophagogastroduodenoscopy  07/2011    mild chronic gastritis  . Colonoscopy  06/2011    mult polyps adenomatous, severe diverticulosis, rpt 3 yrs [name])  . Carotid endarterectomy    . Cardiac catheterization  2004    normal; [facility]  . Cardiovascular stress test  02/2014    WNL, low risk scan, EF 68%   Past Medical History  Diagnosis Date  . HLD (hyperlipidemia)   . Restless leg syndrome     well controlled on mirapex  . OSA on CPAP       sleep study 01/2014 - severe, CPAP at 10, previously on nuvigil (Dr. [name])  . Type 2 diabetes, controlled, with neuropathy 2011  . H/O hiatal hernia   . GERD (gastroesophageal reflux disease)     with sliding HH  . Migraines 03/11/12    now rare  . CVA (cerebral infarction) 03/07/12    slurred speech; right hemplegia, left handed - completed PT 07/2014 (17 sessions)  . Basal cell carcinoma of nose 2002    bridge of nose  . History of kidney problems 12/2010    lacerated left kidney after fall  . BPH with obstruction/lower urinary tract symptoms     failed flomax, uroxatral, myrbetriq - sees urology [name]) pending videourodynamics  . Depression     mild, on cymbalta per prior PCP  . Palpitations 01/2012    holter WNL, rare PAC/PVCs  . Erectile dysfunction     per urology (prostaglandin injection)  . Kidney cysts 02/2014    bilateral (renal US)  . CKD (chronic kidney disease) stage 3, GFR 30-59 ml/min 03/11/2012    Renal US 02/2014 - multiple kidney cysts   . TIA (transient ischemic attack)   . Anemia    BP 144/63 mmHg  Pulse 65  Resp 14  SpO2 100%  Opioid Risk Score:   Fall Risk Score: High Fall Risk (>13 points) (this occurred after botox--previously educated and given handout.  He is going to start outpt tx)`1  Depression screen PHQ 2/9  Depression screen [facility] 2/9 11/06/2013 07/05/2012  Decreased Interest 1 0  Down, Depressed, Hopeless 1 0  PHQ - 2 Score 2 0  Altered sleeping 3 -  Tired, decreased energy 1 -  Change in appetite 1 -  Feeling bad or failure about yourself  0 -  Trouble concentrating 1 -  Moving slowly or fidgety/restless 0 -  Suicidal thoughts 0 -  PHQ-9 Score 8 -    Review of Systems  Musculoskeletal:       Spasticity  All other systems reviewed and are negative.      Objective:   Physical Exam  Constitutional: He is oriented to person, place, and time. He appears well-developed and well-nourished.  HENT:  Head: Normocephalic and  atraumatic.  Eyes: Pupils are equal, round, and reactive to light.  Neck: Normal range of motion.  Neurological: He is alert and oriented to person, place, and time.  Psychiatric: He has a normal mood and affect.  Nursing note and vitals reviewed.   Motor strength is 4 minus at the right deltoid, bicep, tricep, grip There is evidence of some residual flexor spasticity but this is mild. Ashworth grade 1 at the Finger flexors, Ashworth is 0 at the elbow flexors  Her strength in lower extremities 4 minus and hip flexor and extensor ankle dorsiflexor Ambulates with cane         Assessment & Plan:  1. Chronic right spastic hemiplegia secondary to left subcortical infarct. Has had good effect from Botox injection biceps to reduce elbow flexor spasticity. Recommend continued OT to maximize functional usage of the right upper ext  Has had 2 falls he has evidence of foot drag on gait testing. Recommend restarting with AFO. Recommend work with PT as well. He has an order from less than 1 month ago which should still be good.  If he is able to improve his gait pattern and improve his foot clearance he can come out of the AFO.

## 2015-02-22 ENCOUNTER — Other Ambulatory Visit: Payer: Self-pay | Admitting: Family Medicine

## 2015-02-23 ENCOUNTER — Emergency Department (HOSPITAL_COMMUNITY): Payer: Medicare Other

## 2015-02-23 ENCOUNTER — Encounter (HOSPITAL_COMMUNITY): Payer: Self-pay | Admitting: Emergency Medicine

## 2015-02-23 ENCOUNTER — Emergency Department (HOSPITAL_COMMUNITY)
Admission: EM | Admit: 2015-02-23 | Discharge: 2015-02-23 | Disposition: A | Discharge status: Home / Self Care | Payer: Medicare Other | Attending: Emergency Medicine | Admitting: Emergency Medicine

## 2015-02-23 ENCOUNTER — Ambulatory Visit: Payer: Medicare Other | Attending: Physical Medicine & Rehabilitation | Admitting: Occupational Therapy

## 2015-02-23 VITALS — BP 134/61

## 2015-02-23 DIAGNOSIS — R531 Weakness: Secondary | ICD-10-CM | POA: Insufficient documentation

## 2015-02-23 DIAGNOSIS — E1140 Type 2 diabetes mellitus with diabetic neuropathy, unspecified: Secondary | ICD-10-CM | POA: Insufficient documentation

## 2015-02-23 DIAGNOSIS — F329 Major depressive disorder, single episode, unspecified: Secondary | ICD-10-CM | POA: Diagnosis not present

## 2015-02-23 DIAGNOSIS — M6281 Muscle weakness (generalized): Secondary | ICD-10-CM | POA: Insufficient documentation

## 2015-02-23 DIAGNOSIS — Z794 Long term (current) use of insulin: Secondary | ICD-10-CM | POA: Diagnosis not present

## 2015-02-23 DIAGNOSIS — G4733 Obstructive sleep apnea (adult) (pediatric): Secondary | ICD-10-CM | POA: Diagnosis not present

## 2015-02-23 DIAGNOSIS — Z7902 Long term (current) use of antithrombotics/antiplatelets: Secondary | ICD-10-CM | POA: Diagnosis not present

## 2015-02-23 DIAGNOSIS — Z79899 Other long term (current) drug therapy: Secondary | ICD-10-CM | POA: Insufficient documentation

## 2015-02-23 DIAGNOSIS — Z9981 Dependence on supplemental oxygen: Secondary | ICD-10-CM | POA: Diagnosis not present

## 2015-02-23 DIAGNOSIS — Z85828 Personal history of other malignant neoplasm of skin: Secondary | ICD-10-CM | POA: Insufficient documentation

## 2015-02-23 DIAGNOSIS — D649 Anemia, unspecified: Secondary | ICD-10-CM | POA: Insufficient documentation

## 2015-02-23 DIAGNOSIS — R05 Cough: Secondary | ICD-10-CM | POA: Diagnosis not present

## 2015-02-23 DIAGNOSIS — Q6100 Congenital renal cyst, unspecified: Secondary | ICD-10-CM | POA: Insufficient documentation

## 2015-02-23 DIAGNOSIS — R4781 Slurred speech: Secondary | ICD-10-CM | POA: Diagnosis not present

## 2015-02-23 DIAGNOSIS — R269 Unspecified abnormalities of gait and mobility: Secondary | ICD-10-CM | POA: Insufficient documentation

## 2015-02-23 DIAGNOSIS — I69951 Hemiplegia and hemiparesis following unspecified cerebrovascular disease affecting right dominant side: Secondary | ICD-10-CM

## 2015-02-23 DIAGNOSIS — M25511 Pain in right shoulder: Secondary | ICD-10-CM | POA: Insufficient documentation

## 2015-02-23 DIAGNOSIS — K219 Gastro-esophageal reflux disease without esophagitis: Secondary | ICD-10-CM | POA: Diagnosis not present

## 2015-02-23 DIAGNOSIS — E785 Hyperlipidemia, unspecified: Secondary | ICD-10-CM | POA: Insufficient documentation

## 2015-02-23 DIAGNOSIS — R279 Unspecified lack of coordination: Secondary | ICD-10-CM | POA: Insufficient documentation

## 2015-02-23 DIAGNOSIS — K088 Other specified disorders of teeth and supporting structures: Secondary | ICD-10-CM | POA: Insufficient documentation

## 2015-02-23 DIAGNOSIS — N183 Chronic kidney disease, stage 3 (moderate): Secondary | ICD-10-CM | POA: Insufficient documentation

## 2015-02-23 DIAGNOSIS — Z8673 Personal history of transient ischemic attack (TIA), and cerebral infarction without residual deficits: Secondary | ICD-10-CM | POA: Diagnosis not present

## 2015-02-23 DIAGNOSIS — I6980 Unspecified sequelae of other cerebrovascular disease: Secondary | ICD-10-CM | POA: Insufficient documentation

## 2015-02-23 LAB — CBC WITH DIFFERENTIAL/PLATELET
Basophils Absolute: 0 10*3/uL (ref 0.0–0.1)
Basophils Relative: 0 % (ref 0–1)
Eosinophils Absolute: 0.2 10*3/uL (ref 0.0–0.7)
Eosinophils Relative: 3 % (ref 0–5)
HCT: 38.4 % — ABNORMAL LOW (ref 39.0–52.0)
Hemoglobin: 12.7 g/dL — ABNORMAL LOW (ref 13.0–17.0)
Lymphocytes Relative: 23 % (ref 12–46)
Lymphs Abs: 1.8 10*3/uL (ref 0.7–4.0)
MCH: 28.7 pg (ref 26.0–34.0)
MCHC: 33.1 g/dL (ref 30.0–36.0)
MCV: 86.9 fL (ref 78.0–100.0)
Monocytes Absolute: 0.6 10*3/uL (ref 0.1–1.0)
Monocytes Relative: 8 % (ref 3–12)
Neutro Abs: 5.4 10*3/uL (ref 1.7–7.7)
Neutrophils Relative %: 66 % (ref 43–77)
Platelets: 209 10*3/uL (ref 150–400)
RBC: 4.42 MIL/uL (ref 4.22–5.81)
RDW: 14.1 % (ref 11.5–15.5)
WBC: 8 10*3/uL (ref 4.0–10.5)

## 2015-02-23 LAB — I-STAT CHEM 8, ED
BUN: 21 mg/dL — ABNORMAL HIGH (ref 6–20)
Calcium, Ion: 1.25 mmol/L (ref 1.13–1.30)
Chloride: 104 mmol/L (ref 101–111)
Creatinine, Ser: 1.4 mg/dL — ABNORMAL HIGH (ref 0.61–1.24)
Glucose, Bld: 127 mg/dL — ABNORMAL HIGH (ref 65–99)
HCT: 40 % (ref 39.0–52.0)
Hemoglobin: 13.6 g/dL (ref 13.0–17.0)
Potassium: 4.3 mmol/L (ref 3.5–5.1)
Sodium: 140 mmol/L (ref 135–145)
TCO2: 23 mmol/L (ref 0–100)

## 2015-02-23 LAB — BASIC METABOLIC PANEL
Anion gap: 8 (ref 5–15)
BUN: 19 mg/dL (ref 6–20)
CO2: 27 mmol/L (ref 22–32)
Calcium: 9.4 mg/dL (ref 8.9–10.3)
Chloride: 103 mmol/L (ref 101–111)
Creatinine, Ser: 1.37 mg/dL — ABNORMAL HIGH (ref 0.61–1.24)
GFR calc Af Amer: 54 mL/min — ABNORMAL LOW (ref >60)
GFR calc non Af Amer: 47 mL/min — ABNORMAL LOW (ref >60)
Glucose, Bld: 129 mg/dL — ABNORMAL HIGH (ref 65–99)
Potassium: 4.2 mmol/L (ref 3.5–5.1)
Sodium: 138 mmol/L (ref 135–145)

## 2015-02-23 LAB — URINALYSIS, ROUTINE W REFLEX MICROSCOPIC
Bilirubin Urine: NEGATIVE
Glucose, UA: NEGATIVE mg/dL
Hgb urine dipstick: NEGATIVE
Ketones, ur: NEGATIVE mg/dL
Nitrite: NEGATIVE
Protein, ur: NEGATIVE mg/dL
Specific Gravity, Urine: 1.012 (ref 1.005–1.030)
Urobilinogen, UA: 0.2 mg/dL (ref 0.0–1.0)
pH: 5.5 (ref 5.0–8.0)

## 2015-02-23 LAB — URINE MICROSCOPIC-ADD ON

## 2015-02-23 LAB — I-STAT TROPONIN, ED: Troponin i, poc: 0 ng/mL (ref 0.00–0.08)

## 2015-02-23 MED ORDER — SODIUM CHLORIDE 0.9 % IV BOLUS (SEPSIS): 1000.0000 mL | Freq: Once | INTRAVENOUS | Status: DC

## 2015-02-23 NOTE — Therapy (Signed)
[facility] [address] [place], [name], [address] Phone: [phone]   Fax:  [phone]  Occupational Therapy Treatment  Patient Details  Name: James Bell MRN: [id number] Date of Birth: 20-Jan-1931 Referring Provider:  [name], MD  Encounter Date: 02/23/2015    Past Medical History  Diagnosis Date  . HLD (hyperlipidemia)   . Restless leg syndrome     well controlled on mirapex  . OSA on CPAP     sleep study 01/2014 - severe, CPAP at 10, previously on nuvigil (Dr. [name])  . Type 2 diabetes, controlled, with neuropathy 2011  . H/O hiatal hernia   . GERD (gastroesophageal reflux disease)     with sliding HH  . Migraines 03/11/12    now rare  . CVA (cerebral infarction) 03/07/12    slurred speech; right hemplegia, left handed - completed PT 07/2014 (17 sessions)  . Basal cell carcinoma of nose 2002    bridge of nose  . History of kidney problems 12/2010    lacerated left kidney after fall  . BPH with obstruction/lower urinary tract symptoms     failed flomax, uroxatral, myrbetriq - sees urology [name]) pending videourodynamics  . Depression     mild, on cymbalta per prior PCP  . Palpitations 01/2012    holter WNL, rare PAC/PVCs  . Erectile dysfunction     per urology (prostaglandin injection)  . Kidney cysts 02/2014    bilateral (renal US)  . CKD (chronic kidney disease) stage 3, GFR 30-59 ml/min 03/11/2012    Renal US 02/2014 - multiple kidney cysts   . TIA (transient ischemic attack)   . Anemia     Past Surgical History  Procedure Laterality Date  . Prostate surgery  02/2009    PVP (laser)  . Tonsillectomy and adenoidectomy  ~ 1945  . Hemorrhoid surgery  1991  . Cataract extraction w/ intraocular lens implant  11//2012 - 08/2011  . Inguinal hernia repair  1981; 2002    left, then repaired  . Skin cancer excision  2002    bridge of nose, [facility]  . Colonoscopy  05/2011    normal  per pt (Dr. [name], [place], [name])  . Mri brain  04/2010    WNL  . Esophagogastroduodenoscopy  2006    duodenitis, medual HH, Grade 2 reflux, mild gastritis  . Colonoscopy  03/2011    poor prep, unable to biopsy polyp in tic fold, rpt 3 mo [name])  . Abi  2007    WNL  . Urethral dilation  2014    tannenbaum  . Cystoscopy with urethral dilatation N/A 07/24/2013    Procedure: CYSTOSCOPY WITH URETHRAL DILATATION;  Surgeon: [name], MD;  Location: [facility];  Service: Urology;  Laterality: N/A;  . Transurethral incision of prostate N/A 07/24/2013    Procedure: TRANSURETHRAL INCISION OF THE PROSTATE (TUIP);  Surgeon: [name], MD;  Location: [facility];  Service: Urology;  Laterality: N/A;  . Esophagogastroduodenoscopy  07/2011    mild chronic gastritis  . Colonoscopy  06/2011    mult polyps adenomatous, severe diverticulosis, rpt 3 yrs [name])  . Carotid endarterectomy    . Cardiac catheterization  2004    normal; [facility]  . Cardiovascular stress test  02/2014    WNL, low risk scan, EF 68%    Filed Vitals:   02/23/15 0900  BP: 134/61    Visit Diagnosis:  Hemiplegia affecting right side in right-dominant patient  as late effect of cerebrovascular disease      Subjective Assessment - 02/23/15 0900    Subjective  Today is not my best day   Pertinent History see epic snapshot;  pt with H/o of CVA                                OT Short Term Goals - 02/16/15 1155    OT SHORT TERM GOAL #1   Title Pt will be mod I with HEP - 03/16/2015   Status New   OT SHORT TERM GOAL #2   Title Pt will demonstrate ability to complete mid level reach to pick up light object off shelf   Status New   OT SHORT TERM GOAL #3   Title Pt will report no greater than 2/10 pain in R shoulder with shoulder flexion to 130* and abduction to 100* in supine.   Status New   OT SHORT TERM GOAL #4   Title Pt will demonstrate understanding of strategies to compensate  for tying and zipping with AE prn   Status New           OT Long Term Goals - 02/16/15 1158    OT LONG TERM GOAL #1   Title Pt will be mod I with upgraded HEP prn - 04/13/2015   Status New   OT LONG TERM GOAL #2   Title Pt will demonstrate ability for overhead reach to obtain light object off shelf   Status New   OT LONG TERM GOAL #3   Title Pt will demonstrate improved coordination as evidenced by completing entire 9 hole peg test in 2 minutes (baseline= 4 pegs)   Status New   OT LONG TERM GOAL #4   Title Pt will be able to write name with AE prn with R hand legibily   Status New   OT LONG TERM GOAL #5   Title Pt will demonstrate ability to use RUE as non dominant for basic ADL' tasks (baseline= gross assist)   Status New   Long Term Additional Goals   Additional Long Term Goals Yes   OT LONG TERM GOAL #6   Title Pt will be able to use RUE in cooking task as non dominant for at least 50% of the task.    Status New               Problem List Patient Active Problem List   Diagnosis Date Noted  . Spastic hemiparesis affecting dominant side 07/07/2014  . Right leg swelling 06/09/2014  . Left shoulder pain 03/16/2014  . Chest pressure 02/03/2014  . Spastic hemiplegia affecting dominant side 11/07/2013  . Medicare annual wellness visit, subsequent 11/06/2013  . Erectile dysfunction due to arterial insufficiency 02/05/2013  . Depression   . Palpitations 06/26/2012  . HLD (hyperlipidemia)   . Restless leg syndrome   . GERD (gastroesophageal reflux disease)   . BPH with obstruction/lower urinary tract symptoms   . CVA (cerebrovascular accident) 03/11/2012  . Right hemiparesis 03/11/2012  . Type 2 diabetes, controlled, with neuropathy 03/11/2012  . OSA (obstructive sleep apnea) 03/11/2012  . CKD (chronic kidney disease) stage 3, GFR 30-59 ml/min 03/11/2012  . Migraines 03/11/2012   Patient arrived today with significant change in gait (prior to today pt was  independent with cane). Pt with staggering gait today, requiring min a to ambulate safely as well as slurred speech. Pt reports significant  fatigue over past two weeks, worse today and has fallen twice in past two weeks. Pt also seemed slightly slower to process information based on my past interactions with patient. Pt also reports he has not been sleeping well.  BP was stable at 134/60 however with this significant change discussed with patient that I felt he needed to be seen by the emergency room.  Pt in agreement. Pt called friend who will drive him to the ED this morning.   Quay Burow 02/23/2015, 9:10 AM  [facility] [address] [place], [name], [address] Phone: [phone]   Fax:  [phone]

## 2015-02-23 NOTE — Discharge Instructions (Signed)

## 2015-02-23 NOTE — ED Notes (Signed)
Patient returned from CT and xray.

## 2015-02-23 NOTE — ED Notes (Signed)
Patient coming from Outpatient Rehab with significant change in gait, prior to today patient was independent with cane.  Patient had staggering gait today at the rehab center and slurred speech.  Patient reported to seeming slower to process information.  LKN about 2 weeks ago.  Patient states he has been working late nights on a project and attributes this to his change in sleep and cognition.  Patient is alert and oriented x 4.

## 2015-02-23 NOTE — ED Provider Notes (Signed)
CSN: [id number]     Arrival date & time 02/23/15  1026 History   First MD Initiated Contact with Patient 02/23/15 1038     No chief complaint on file.    (Consider location/radiation/quality/duration/timing/severity/associated sxs/prior Treatment) HPI   Mr. Harvill is a 81 year old gentleman with CVA with right-sided hemiparesis, HLD, and DM who was sent to [facility] ED this morning after his OT observed a change in gait with slurred speech this morning at his outpatient rehab therapy appointment.  Pt has regained a lot of motor function on his right side with frequent OT, PT and procedural interventions, such as botox injections.  He used to ambulate with a cane, but now is able to walk with a brace on his right lower extremity.  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  He feels that he may be dehydrated, and is slightly weaker than normal, with the weakness more pronounced on his right side. He attributes his slurred speech to recent dental work that he's had done over the last 3 weeks.  Tooth #31 and was infected and subsequently extracted by an oral surgeon, with a course of Amoxicillin and then clindamycin.  A bone graft was put in with sutures and tissue tags in place and pt still has bruising, discomfort and swelling over his right jaw and gums.  His nephew is a Pharmacist, community who has seen him throughout the past three weeks of treatment.  The nephew, [name] confirmed additional history over the phone, stated that he saw the pt two days ago, and did notice a little change in speech, a little slower than usual, but  confirmed his increased activities and general weakness.   Pt was sent to the ER from his OT visit this morning, has no complaints other than some lightheadedness when going from sitting to standing, and some general weakness from lack of sleep and from being out in the sun too much.  Past Medical History  Diagnosis Date  . HLD (hyperlipidemia)   . Restless leg syndrome     well controlled on mirapex  . OSA on CPAP     sleep study 01/2014 - severe, CPAP at 10, previously on nuvigil (Dr. [name])  . Type 2 diabetes, controlled, with neuropathy 2011  . H/O hiatal hernia   . GERD (gastroesophageal reflux disease)     with sliding HH  . Migraines 03/11/12    now rare  . CVA (cerebral infarction) 03/07/12    slurred speech; right hemplegia, left handed - completed PT 07/2014 (17 sessions)  . Basal cell carcinoma of nose 2002    bridge of nose  . History of kidney problems 12/2010    lacerated left kidney after fall  . BPH with obstruction/lower urinary tract symptoms     failed flomax, uroxatral, myrbetriq - sees urology [name]) pending videourodynamics  . Depression     mild, on cymbalta per prior PCP  . Palpitations 01/2012    holter WNL, rare PAC/PVCs  . Erectile dysfunction     per urology (prostaglandin injection)  . Kidney cysts 02/2014  bilateral (renal US)  . CKD (chronic kidney disease) stage 3, GFR 30-59 ml/min 03/11/2012    Renal US 02/2014 - multiple kidney cysts   . TIA (transient ischemic attack)   . Anemia    Past Surgical History  Procedure Laterality Date  . Prostate surgery  02/2009    PVP (laser)  . Tonsillectomy and adenoidectomy  ~ 1945  . Hemorrhoid surgery  1991  . Cataract extraction w/ intraocular lens implant  11//2012 - 08/2011  . Inguinal hernia repair  1981; 2002    left, then repaired  . Skin cancer excision  2002    bridge of nose, BCC  . Colonoscopy  05/2011    normal per pt (Dr. [name], [place], [name])  . Mri brain  04/2010    WNL  .  Esophagogastroduodenoscopy  2006    duodenitis, medual HH, Grade 2 reflux, mild gastritis  . Colonoscopy  03/2011    poor prep, unable to biopsy polyp in tic fold, rpt 3 mo [name])  . Abi  2007    WNL  . Urethral dilation  2014    tannenbaum  . Cystoscopy with urethral dilatation N/A 07/24/2013    Procedure: CYSTOSCOPY WITH URETHRAL DILATATION;  Surgeon: [name], MD;  Location: [facility];  Service: Urology;  Laterality: N/A;  . Transurethral incision of prostate N/A 07/24/2013    Procedure: TRANSURETHRAL INCISION OF THE PROSTATE (TUIP);  Surgeon: [name], MD;  Location: [facility];  Service: Urology;  Laterality: N/A;  . Esophagogastroduodenoscopy  07/2011    mild chronic gastritis  . Colonoscopy  06/2011    mult polyps adenomatous, severe diverticulosis, rpt 3 yrs [name])  . Carotid endarterectomy    . Cardiac catheterization  2004    normal; [facility]  . Cardiovascular stress test  02/2014    WNL, low risk scan, EF 68%   Family History  Problem Relation Age of Onset  . Stroke Father   . Lung cancer Father 16     smoker  . Stroke Brother   . Diabetes Brother 75  . Coronary artery disease Neg Hx   . Lung cancer Brother   . Alcoholism Sister   . Kidney disease Sister    History  Substance Use Topics  . Smoking status: Never Smoker   . Smokeless tobacco: Never Used     Comment: "used to smoke pipe when I was younger"  . Alcohol Use: 2.4 oz/week    2 Glasses of wine, 2 Shots of liquor per week     Comment: occasional    Review of Systems  Constitutional: Positive for activity change and fatigue. Negative for fever, chills, diaphoresis, appetite change and unexpected weight change.  HENT: Positive for dental problem and facial swelling. Negative for congestion, rhinorrhea, sinus pressure, sneezing, sore throat, trouble swallowing and voice change.   Respiratory: Positive for cough. Negative for apnea, choking, chest tightness, shortness of breath, wheezing  and stridor.   Cardiovascular: Negative for chest pain, palpitations and leg swelling.  Gastrointestinal: Negative for nausea, vomiting, abdominal pain, diarrhea, constipation and blood in stool.  Genitourinary: Negative for dysuria, urgency, frequency, hematuria, flank pain, decreased urine volume and difficulty urinating.  Musculoskeletal: Negative.   Skin: Negative.   Neurological: Positive for weakness and light-headedness. Negative for dizziness, tremors, seizures, syncope, facial asymmetry, speech difficulty, numbness and headaches.  Psychiatric/Behavioral: Negative for confusion, sleep disturbance, dysphoric mood, decreased concentration and agitation. The patient is not nervous/anxious.    Allergies  Lisinopril; Nexium; and Niacin and related  Home Medications   Prior to Admission medications   Medication Sig Start Date End Date Taking? Authorizing Provider  atorvastatin (LIPITOR) 40 MG tablet TAKE 1 TABLET DAILY 01/19/15   [name], MD  cetirizine (ZYRTEC) 10 MG tablet Take 1 tablet by mouth daily as needed.    Historical Provider, MD  clopidogrel (PLAVIX) 75 MG tablet Take 1 tablet (75 mg total) by mouth daily. NEED OFFICE VISIT FOR MORE REFILLS 12/05/14   [name], MD  Coenzyme Q10 (COQ10) 150 MG CAPS Take 1 capsule by mouth 2 (two) times daily.    Historical Provider, MD  diazepam (VALIUM) 5 MG tablet Take 1 tablet (5 mg total) by mouth every 8 (eight) hours as needed for anxiety.    [name], MD  DULoxetine (CYMBALTA) 60 MG capsule TAKE 1 CAPSULE DAILY 04/23/14   [name], MD  Folic Acid-Vit J5-KKX F81 (FOLBEE) 2.5-25-1 MG TABS tablet Take 1 tablet by mouth daily. 10/04/12   [name], MD  Insulin Glargine (LANTUS SOLOSTAR) 100 UNIT/ML Solostar Pen Inject 60 Units into the skin at bedtime. Patient taking differently: Inject 50 Units into the skin at bedtime.  09/28/14   [name], MD  losartan (COZAAR) 25 MG tablet Take 1 tablet (25 mg  total) by mouth daily.    [name], MD  metoprolol tartrate (LOPRESSOR) 25 MG tablet TAKE 1 TABLET TWICE A DAY 01/04/15   [name], MD  NITROSTAT 0.4 MG SL tablet DISSOLVE 1 TABLET UNDER THE TONGUE EVERY 5 MINUTES AS NEEDED FOR CHEST PAIN 04/23/14   [name], MD  NOVOFINE 30G X 8 MM MISC USE ONE DAILY AS DIRECTED 01/04/15   [name], MD  omega-3 acid ethyl esters (LOVAZA) 1 G capsule TAKE 2 CAPSULES (2 GRAMS TOTAL) TWICE A DAY 12/15/14   [name], MD  omeprazole (PRILOSEC) 40 MG capsule TAKE 1 CAPSULE TWICE A DAY AS NEEDED 02/22/15   [name], MD  pramipexole (MIRAPEX) 1 MG tablet Take 1 tablet (1 mg total) by mouth at bedtime. NEED OFFICE VISIT FOR MORE REFILLS 12/05/14   [name], MD  sitaGLIPtin-metformin (JANUMET) 50-1000 MG per tablet Take 1 tablet by mouth 2 (two) times daily with a meal.    Historical Provider, MD  traZODone (DESYREL) 50 MG tablet Take 25 mg by mouth at bedtime as needed. 01/05/15   Historical Provider, MD  vitamin C (ASCORBIC ACID) 500 MG tablet Take 1,000 mg by mouth daily.     Historical Provider, MD   There were no vitals taken for this visit. Physical Exam  Constitutional: He is oriented to person, place, and time. He appears well-developed and well-nourished. No distress.  HENT:  Head: Normocephalic and atraumatic.  Nose: Nose normal.  Mouth/Throat: Oropharynx is clear and moist. No oropharyngeal exudate.  Eyes: Conjunctivae and EOM are normal. Pupils are equal, round, and reactive to light. Right eye exhibits no discharge. Left eye exhibits no discharge. No scleral icterus.  Neck: Normal range of motion. No JVD present. No tracheal deviation present. No thyromegaly present.  Cardiovascular: Normal rate, regular rhythm, normal heart sounds and intact distal pulses.  Exam reveals no gallop and no friction rub.   No murmur heard. Pulmonary/Chest: Effort normal and breath sounds normal. No respiratory distress. He has no  wheezes. He has no rales. He exhibits no tenderness.  Abdominal: Soft. Bowel sounds are normal. He exhibits no distension and no mass. There is no tenderness. There is  no rebound and no guarding.  Musculoskeletal: Normal range of motion. He exhibits no edema or tenderness.  Lymphadenopathy:    He has no cervical adenopathy.  Neurological: He is alert and oriented to person, place, and time. He has normal reflexes. He displays no atrophy and no tremor. No cranial nerve deficit or sensory deficit.  Mildly Decreased strength and motor on right side   Skin: Skin is warm and dry. No rash noted. No erythema. No pallor.  Psychiatric: He has a normal mood and affect. His behavior is normal. Judgment and thought content normal.  Nursing note and vitals reviewed.   ED Course  Procedures (including critical care time) Labs Review Labs Reviewed - No data to display  Imaging Review No results found.   EKG Interpretation None      MDM   Final diagnoses:  None    Pt history of right hemiparesis s/p CVA 3 years ago, with reported change of speech and gait was sent to ER by OT.  Pt denies any change in gait and voice, only has some general weakness from recent increased activity, decreased sleep, and recent invasive dental procedures.    Do not suspect a new CVA, but with history must rule out.  Pt did ambulate for me and Josh Geiple - PA-C, with a small shuffle due to shorted length of time standing on his right foot, which is in a brace.  He has recently changed from a cane to a brace.  He has good grip strength bilaterally, minimally decreased on his right side, normal CN, no facial asymmetry, speech is slow throughout the whole visit, his niece is with him who only sees him two times a year, and cannot speak to any differences.    Will do head CT, stroke work-up, orthostatics, and make sure there is no UTIf.  Orthostatics and urine normal, no changes on head CT.  Feel he is stable for  discharge, with no evidence of CVA, infection or dehydration, and pt wishes to discharge.  States he was discharged from his neurologist a year ago and he will find a new one to follow up with outpt.    Pt was seen with Dr. Perry Mount, PA-C 02/25/15 [place], MD 02/26/15 1239

## 2015-02-25 ENCOUNTER — Ambulatory Visit: Payer: Medicare Other | Admitting: Occupational Therapy

## 2015-03-02 ENCOUNTER — Encounter: Payer: Self-pay | Admitting: Occupational Therapy

## 2015-03-02 ENCOUNTER — Ambulatory Visit: Payer: Medicare Other | Admitting: Rehabilitative and Restorative Service Providers"

## 2015-03-02 ENCOUNTER — Ambulatory Visit: Payer: Medicare Other | Admitting: Occupational Therapy

## 2015-03-02 DIAGNOSIS — R269 Unspecified abnormalities of gait and mobility: Secondary | ICD-10-CM | POA: Diagnosis not present

## 2015-03-02 DIAGNOSIS — I6980 Unspecified sequelae of other cerebrovascular disease: Secondary | ICD-10-CM | POA: Diagnosis not present

## 2015-03-02 DIAGNOSIS — I69951 Hemiplegia and hemiparesis following unspecified cerebrovascular disease affecting right dominant side: Secondary | ICD-10-CM

## 2015-03-02 DIAGNOSIS — M25511 Pain in right shoulder: Secondary | ICD-10-CM

## 2015-03-02 DIAGNOSIS — R531 Weakness: Secondary | ICD-10-CM | POA: Diagnosis not present

## 2015-03-02 DIAGNOSIS — R279 Unspecified lack of coordination: Secondary | ICD-10-CM | POA: Diagnosis not present

## 2015-03-02 DIAGNOSIS — M6281 Muscle weakness (generalized): Secondary | ICD-10-CM

## 2015-03-02 NOTE — Patient Instructions (Signed)
Home Exercise Program for Your Arm:  1. Sit on a firm surface.  Keep your feet apart.  Clasp hands together. Slowly reach for the floor and relax into the stretch. Hold for a slow count of 5. Then keeping elbows straight, use both hands to reach forward as far as your pain will allow. Return to hands then sit back up. Do 10 in the morning and 10 at night.  THERE SHOULD  BE NO PAIN WITH THIS. Make sure your breath and focus on grading your effort - enough effort to get the job done and no more!!  You can also do this through the day if the arm gets tight.

## 2015-03-02 NOTE — Therapy (Signed)
[facility] [address] [place], [name], [address] Phone: [phone]   Fax:  [phone]  Occupational Therapy Treatment  Patient Details  Name: James Bell MRN: [id number] Date of Birth: 1930/11/09 Referring Provider:  [name], MD  Encounter Date: 03/02/2015      OT End of Session - 03/02/15 0941    Visit Number 2   Number of Visits 16   Date for OT Re-Evaluation 04/13/15   Authorization Type MCR trad, tricare - G code needed   OT Start Time 0845   OT Stop Time 0931   OT Time Calculation (min) 46 min   Activity Tolerance Patient tolerated treatment well      Past Medical History  Diagnosis Date  . HLD (hyperlipidemia)   . Restless leg syndrome     well controlled on mirapex  . OSA on CPAP     sleep study 01/2014 - severe, CPAP at 10, previously on nuvigil (Dr. [name])  . Type 2 diabetes, controlled, with neuropathy 2011  . H/O hiatal hernia   . GERD (gastroesophageal reflux disease)     with sliding HH  . Migraines 03/11/12    now rare  . CVA (cerebral infarction) 03/07/12    slurred speech; right hemplegia, left handed - completed PT 07/2014 (17 sessions)  . Basal cell carcinoma of nose 2002    bridge of nose  . History of kidney problems 12/2010    lacerated left kidney after fall  . BPH with obstruction/lower urinary tract symptoms     failed flomax, uroxatral, myrbetriq - sees urology [name]) pending videourodynamics  . Depression     mild, on cymbalta per prior PCP  . Palpitations 01/2012    holter WNL, rare PAC/PVCs  . Erectile dysfunction     per urology (prostaglandin injection)  . Kidney cysts 02/2014    bilateral (renal US)  . CKD (chronic kidney disease) stage 3, GFR 30-59 ml/min 03/11/2012    Renal US 02/2014 - multiple kidney cysts   . TIA (transient ischemic attack)   . Anemia     Past Surgical History  Procedure Laterality Date  . Prostate surgery  02/2009     PVP (laser)  . Tonsillectomy and adenoidectomy  ~ 1945  . Hemorrhoid surgery  1991  . Cataract extraction w/ intraocular lens implant  11//2012 - 08/2011  . Inguinal hernia repair  1981; 2002    left, then repaired  . Skin cancer excision  2002    bridge of nose, [facility]  . Colonoscopy  05/2011    normal per pt (Dr. [name], [place], [name])  . Mri brain  04/2010    WNL  . Esophagogastroduodenoscopy  2006    duodenitis, medual HH, Grade 2 reflux, mild gastritis  . Colonoscopy  03/2011    poor prep, unable to biopsy polyp in tic fold, rpt 3 mo [name])  . Abi  2007    WNL  . Urethral dilation  2014    tannenbaum  . Cystoscopy with urethral dilatation N/A 07/24/2013    Procedure: CYSTOSCOPY WITH URETHRAL DILATATION;  Surgeon: [name], MD;  Location: [facility];  Service: Urology;  Laterality: N/A;  . Transurethral incision of prostate N/A 07/24/2013    Procedure: TRANSURETHRAL INCISION OF THE PROSTATE (TUIP);  Surgeon: [name], MD;  Location: [facility];  Service: Urology;  Laterality: N/A;  . Esophagogastroduodenoscopy  07/2011    mild chronic gastritis  . Colonoscopy  06/2011    mult polyps adenomatous, severe diverticulosis, rpt 3 yrs [name])  . Carotid endarterectomy    . Cardiac catheterization  2004    normal; [facility]  . Cardiovascular stress test  02/2014    WNL, low risk scan, EF 68%    There were no vitals filed for this visit.  Visit Diagnosis:  Hemiplegia affecting right side in right-dominant patient as late effect of cerebrovascular disease  Pain in joint, shoulder region, right  Muscle weakness      Subjective Assessment - 03/02/15 0852    Subjective  I am still tired but feeling a little better.   Pertinent History see epic snapshot;  pt with H/o of CVA   Patient Stated Goals I have made alot of progress but would like to be able to do even more.   Currently in Pain? No/denies                      OT Treatments/Exercises  (OP) - 03/02/15 0001    Neurological Re-education Exercises   Other Exercises 1 Neuro re ed to address shoulder flexion mid reach in supine with elbow extension, wrist in extension and open hand.  Pt able to do 10 reps x 2 for chest presses and 12 reps x2 for shoulder flexion in supine with min facilitation. Pt needs mod cues for grading of movement.  Then transitioned to sitting and worked on activity for tone reduction, shoulder flexion and improved alignment of shoulder girdle to allow increased pain free shoulder flexion. Pt given activity as start of HEP. Pt able to return demonstrate after practice.                 OT Education - 03/02/15 [phone]    Education provided Yes   Education Details HEP for arm   Person(s) Educated Patient   Methods Explanation;Demonstration;Tactile cues;Verbal cues;Handout   Comprehension Verbalized understanding;Returned demonstration          OT Short Term Goals - 03/02/15 5027    OT SHORT TERM GOAL #1   Title Pt will be mod I with HEP - 03/16/2015   Status On-going   OT SHORT TERM GOAL #2   Title Pt will demonstrate ability to complete mid level reach to pick up light object off shelf   Status On-going   OT SHORT TERM GOAL #3   Title Pt will report no greater than 2/10 pain in R shoulder with shoulder flexion to 130* and abduction to 100* in supine.   Status On-going   OT SHORT TERM GOAL #4   Title Pt will demonstrate understanding of strategies to compensate for tying and zipping with AE prn   Status On-going           OT Long Term Goals - 03/02/15 7412    OT LONG TERM GOAL #1   Title Pt will be mod I with upgraded HEP prn - 04/13/2015   Status On-going   OT LONG TERM GOAL #2   Title Pt will demonstrate ability for overhead reach to obtain light object off shelf   Status On-going   OT LONG TERM GOAL #3   Title Pt will demonstrate improved coordination as evidenced by completing entire 9 hole peg test in 2 minutes (baseline= 4 pegs)    Status On-going   OT LONG TERM GOAL #4   Title Pt will be able to write name with AE prn with R hand legibily   Status On-going  OT LONG TERM GOAL #5   Title Pt will demonstrate ability to use RUE as non dominant for basic ADL' tasks (baseline= gross assist)   Status On-going   OT LONG TERM GOAL #6   Title Pt will be able to use RUE in cooking task as non dominant for at least 50% of the task.    Status On-going               Plan - 03/02/15 0939    Clinical Impression Statement Pt feeling better and has been trying to hydrate more and get more rest. Pt with progress toward goals;  revewed STG and LTG's with patient and provided in writing per pt request. Pt in agreement with goals.   Pt will benefit from skilled therapeutic intervention in order to improve on the following deficits (Retired) Decreased balance;Decreased coordination;Decreased mobility;Decreased range of motion;Decreased strength;Impaired UE functional use;Impaired tone;Impaired flexibility;Pain;Difficulty walking   Rehab Potential Good   OT Frequency 2x / week   OT Duration 8 weeks   OT Treatment/Interventions Self-care/ADL training;Moist Heat;DME and/or AE instruction;Neuromuscular education;Therapeutic exercise;Functional Mobility Training;Manual Therapy;Passive range of motion;Therapeutic activities;Balance training;Patient/family education   Plan check HEP and add to HEP as able, neuro re ed to address tightness/pain in shoulder as well as working toward mid reach   [employer] and Agree with Plan of Care Patient        Problem List Patient Active Problem List   Diagnosis Date Noted  . Spastic hemiparesis affecting dominant side 07/07/2014  . Right leg swelling 06/09/2014  . Left shoulder pain 03/16/2014  . Chest pressure 02/03/2014  . Spastic hemiplegia affecting dominant side 11/07/2013  . Medicare annual wellness visit, subsequent 11/06/2013  . Erectile dysfunction due to arterial insufficiency  02/05/2013  . Depression   . Palpitations 06/26/2012  . HLD (hyperlipidemia)   . Restless leg syndrome   . GERD (gastroesophageal reflux disease)   . BPH with obstruction/lower urinary tract symptoms   . CVA (cerebrovascular accident) 03/11/2012  . Right hemiparesis 03/11/2012  . Type 2 diabetes, controlled, with neuropathy 03/11/2012  . OSA (obstructive sleep apnea) 03/11/2012  . CKD (chronic kidney disease) stage 3, GFR 30-59 ml/min 03/11/2012  . Migraines 03/11/2012    Quay Burow, OTR/L 03/02/2015, 9:42 AM  [facility] [address] [place], [name], [address] Phone: [phone]   Fax:  [phone]

## 2015-03-03 NOTE — Therapy (Signed)
[facility] [address] [place], [name], [address] Phone: [phone]   Fax:  [phone]  Physical Therapy Evaluation  Patient Details  Name: James Bell MRN: [id number] Date of Birth: [birth year] Referring Provider:  Charlett Blake, MD  Encounter Date: 03/02/2015      PT End of Session - 03/03/15 0816    Visit Number 1   Number of Visits 12   Date for PT Re-Evaluation 04/30/15   Authorization Type G-code required every 10th visit for Medicare.   PT Start Time 1020   PT Stop Time 1105   PT Time Calculation (min) 45 min   Equipment Utilized During Treatment Gait belt   Activity Tolerance Patient tolerated treatment well   Behavior During Therapy WFL for tasks assessed/performed      Past Medical History  Diagnosis Date  . HLD (hyperlipidemia)   . Restless leg syndrome     well controlled on mirapex  . OSA on CPAP     sleep study 01/2014 - severe, CPAP at 10, previously on nuvigil (Dr. [name])  . Type 2 diabetes, controlled, with neuropathy 2011  . H/O hiatal hernia   . GERD (gastroesophageal reflux disease)     with sliding HH  . Migraines 03/11/12    now rare  . CVA (cerebral infarction) 03/07/12    slurred speech; right hemplegia, left handed - completed PT 07/2014 (17 sessions)  . Basal cell carcinoma of nose 2002    bridge of nose  . History of kidney problems 12/2010    lacerated left kidney after fall  . BPH with obstruction/lower urinary tract symptoms     failed flomax, uroxatral, myrbetriq - sees urology [name]) pending videourodynamics  . Depression     mild, on cymbalta per prior PCP  . Palpitations 01/2012    holter WNL, rare PAC/PVCs  . Erectile dysfunction     per urology (prostaglandin injection)  . Kidney cysts 02/2014    bilateral (renal US)  . CKD (chronic kidney disease) stage 3, GFR 30-59 ml/min 03/11/2012    Renal US 02/2014 - multiple kidney cysts   . TIA  (transient ischemic attack)   . Anemia     Past Surgical History  Procedure Laterality Date  . Prostate surgery  02/2009    PVP (laser)  . Tonsillectomy and adenoidectomy  ~ 1945  . Hemorrhoid surgery  1991  . Cataract extraction w/ intraocular lens implant  11//2012 - 08/2011  . Inguinal hernia repair  1981; 2002    left, then repaired  . Skin cancer excision  2002    bridge of nose, BCC  . Colonoscopy  05/2011    normal per pt (Dr. [name], [place], [name])  . Mri brain  04/2010    WNL  . Esophagogastroduodenoscopy  2006    duodenitis, medual HH, Grade 2 reflux, mild gastritis  . Colonoscopy  03/2011    poor prep, unable to biopsy polyp in tic fold, rpt 3 mo [name])  . Abi  2007    WNL  . Urethral dilation  2014    tannenbaum  . Cystoscopy with urethral dilatation N/A 07/24/2013    Procedure: CYSTOSCOPY WITH URETHRAL DILATATION;  Surgeon: [name], MD;  Location: [facility];  Service: Urology;  Laterality: N/A;  . Transurethral incision of prostate N/A 07/24/2013    Procedure: TRANSURETHRAL INCISION OF THE PROSTATE (TUIP);  Surgeon: [name], MD;  Location: [facility];  Service: Urology;  Laterality: N/A;  . Esophagogastroduodenoscopy  07/2011    mild chronic gastritis  . Colonoscopy  06/2011    mult polyps adenomatous, severe diverticulosis, rpt 3 yrs [name])  . Carotid endarterectomy    . Cardiac catheterization  2004    normal; [facility]  . Cardiovascular stress test  02/2014    WNL, low risk scan, EF 68%    There were no vitals filed for this visit.  Visit Diagnosis:  Abnormality of gait - Plan: PT plan of care cert/re-cert  Muscle weakness - Plan: PT plan of care cert/re-cert  Generalized weakness - Plan: PT plan of care cert/re-cert      Subjective Assessment - 03/02/15 1026    Subjective The patient is s/p CVA 81 years ago reporting steady improvement.  He notes worsening balance and fatigue in the R LE during standing activities.  Pt c/o  stiffness after sitting for longer periods.  Pt feels better walking than standing still.   Patient Stated Goals Return to walking more confidently and improve balance.   Currently in Pain? Yes   Pain Score --  mild in R shoulder 1-2/10, recent jaw procedure            [facility] PT Assessment - 03/02/15 1030    Assessment   Medical Diagnosis L CVA   Onset Date/Surgical Date --  03/09/2012   Hand Dominance Right   Prior Therapy --  at hospital in [facility] s/p CVA   Precautions   Precautions Fall   Balance Screen   Has the patient fallen in the past 6 months Yes   How many times? 2  ledge stepping up into garage, and second fall leg gave way   Has the patient had a decrease in activity level because of a fear of falling?  Yes   Is the patient reluctant to leave their home because of a fear of falling?  No   Home Environment   Living Environment Private residence   Living Arrangements Alone   Type of [place] to enter   Entrance Stairs-Number of Steps --  1   Entrance Stairs-Rails None   Home Layout One level   [place] - quad;Cane - single point;Shower seat   Prior Function   Level of Independence Independent  prior to CVA   Vocation Retired  Film/video editor   Observation/Other Assessments   Focus on Therapeutic Outcomes (FOTO)  56%   Other Surveys  --  ABC scale=53.8%   Sensation   Light Touch Appears Intact   ROM / Strength   AROM / PROM / Strength AROM;Strength   AROM   Overall AROM  Deficits   Overall AROM Comments --  tightness in R hamstring noted    Strength   Overall Strength Comments 4/5 R hip flexion, 3/5 R hip abduction and extension, 5/5 R knee flexion/extension, L LEs 5/5 hip flexsion, 5/5 knee flexion/extension, 3/5 L ankle dorsiflexion   Transfers   Sit to Stand 7: Independent   Stand to Sit 7: Independent   Ambulation/Gait   Ambulation/Gait Yes   Ambulation/Gait Assistance 6: Modified independent (Device/Increase  time)   Gait Pattern --  L weight shift, Trendelenberg gait with R hip weakness   Ambulation Surface Level   Gait velocity 1.85 ft/sec   Stairs Yes   Stairs Assistance 5: Supervision   Stair Management Technique One rail Left;Step to pattern  can also do alternating pattern   Number of  Stairs --  4   Height of Stairs --  6"   Standardized Balance Assessment   Standardized Balance Assessment Berg Balance Test   Berg Balance Test   Sit to Stand Able to stand without using hands and stabilize independently   Standing Unsupported Able to stand safely 2 minutes   Sitting with Back Unsupported but Feet Supported on Floor or Stool Able to sit safely and securely 2 minutes   Stand to Sit Sits safely with minimal use of hands   Transfers Able to transfer safely, minor use of hands   Standing Unsupported with Eyes Closed Able to stand 10 seconds with supervision   Standing Ubsupported with Feet Together Able to place feet together independently and stand for 1 minute with supervision   From Standing, Reach Forward with Outstretched Arm Can reach forward >12 cm safely (5")   From Standing Position, Pick up Object from [place] to pick up shoe safely and easily   From Standing Position, Turn to Look Behind Over each Shoulder Looks behind one side only/other side shows less weight shift   Turn 360 Degrees Able to turn 360 degrees safely but slowly   Standing Unsupported, Alternately Place Feet on Step/Stool Able to complete >2 steps/needs minimal assist   Standing Unsupported, One Foot in Front Able to take small step independently and hold 30 seconds   Standing on One Leg Tries to lift leg/unable to hold 3 seconds but remains standing independently   Total Score 42            PT Short Term Goals - 03/03/15 0818    PT SHORT TERM GOAL #1   Title Pt will be independent in HEP to improve strength and functional mobility.    Baseline Target date 03/31/2015   Time 4   Period Weeks   PT  SHORT TERM GOAL #2   Title Pt will improve Berg from 42/56 up to > or equal to 45/56 to demo decreasing risk for falls.   Baseline Target date 03/31/2015   Time 4   Period Weeks   PT SHORT TERM GOAL #3   Title The patient will increase gait speed from 1.85 ft/sec up to 2.2 ft/sec to demo improving gait speed for functional mobility.   Baseline Target date 03/31/2015   Time 4   Period Weeks   PT SHORT TERM GOAL #4   Title Pt will ambulate > or equal to 400 ft nonstop on level/unlevel surfaces with LRAD modified indep to demo improving endurance.   Baseline Target date 03/31/2015   Time 4   Period Weeks           PT Long Term Goals - 03/03/15 6720    PT LONG TERM GOAL #1   Title Pt will be able to verbalize understanding of falls prevention strategies to reduce risk of falls.    Baseline Target date 04/30/2015   Time 8   Period Weeks   PT LONG TERM GOAL #2   Title Patient will improve Berg from 42/56 up to > or equal to 49/56 to demo decreased risk for fall.   Baseline Target date 04/30/2015   Time 8   Period Weeks   PT LONG TERM GOAL #3   Title The patient will increase gait speed from 1.85 ft/sec up to 2.6 ft/sec to demo improving functional mobility.   Baseline Target date 04/30/2015   Time 8   Period Weeks   PT LONG TERM GOAL #4  Title The patient will ambulate for 10 minutes nonstop to demo improved endurance for functional mobility.   Baseline Target date 04/30/2015   Time 8   Period Weeks   PT LONG TERM GOAL #5   Title Pt will verbalize understanding of HEP for post d/c activities.   Baseline Target date 04/30/2015   Time 8   Period Weeks   PT LONG TERM GOAL #6   Title Pt will improve ABC scale from 53.8% up to 68%.   Baseline Target date 04/30/2015   Time 8   Period Weeks               Plan - Mar 18, 2015 6967    Clinical Impression Statement The patient is an 81 yo male known to our clinic from prior physical therapy.  He presents with hip weakness s/p CVA  limiting R stance phase and confidence with balance/gait activities.   Pt will benefit from skilled therapeutic intervention in order to improve on the following deficits Abnormal gait;Decreased endurance;Decreased balance;Decreased mobility;Decreased strength;Decreased knowledge of use of DME;Decreased range of motion;Impaired flexibility;Impaired UE functional use;Postural dysfunction   Rehab Potential Good   PT Frequency 2x / week   PT Duration 4 weeks  followed by 1x/week for 4 weeks   PT Treatment/Interventions ADLs/Self Care Home Management;DME Instruction;Balance training;Manual techniques;Therapeutic exercise;Therapeutic activities;Patient/family education;Functional mobility training;Stair training;Gait training;Neuromuscular re-education   PT Next Visit Plan R hip strengthening, adding HEP, R leg stance activities, gait on treadmill with assist progressing speed and reciprocal nature   Consulted and Agree with Plan of Care Patient          G-Codes - 18-Mar-2015 0840    Functional Assessment Tool Used Berg=42.56, gait speed=1.85 ft/sec, ABC scale=53.8%   Functional Limitation Mobility: Walking and moving around   Mobility: Walking and Moving Around Current Status [phone]) At least 20 percent but less than 40 percent impaired, limited or restricted   Mobility: Walking and Moving Around Goal Status [phone]) At least 1 percent but less than 20 percent impaired, limited or restricted       Problem List Patient Active Problem List   Diagnosis Date Noted  . Spastic hemiparesis affecting dominant side 07/07/2014  . Right leg swelling 06/09/2014  . Left shoulder pain 03/16/2014  . Chest pressure 02/03/2014  . Spastic hemiplegia affecting dominant side 11/07/2013  . Medicare annual wellness visit, subsequent 11/06/2013  . Erectile dysfunction due to arterial insufficiency 02/05/2013  . Depression   . Palpitations 06/26/2012  . HLD (hyperlipidemia)   . Restless leg syndrome   . GERD  (gastroesophageal reflux disease)   . BPH with obstruction/lower urinary tract symptoms   . CVA (cerebrovascular accident) 03/11/2012  . Right hemiparesis 03/11/2012  . Type 2 diabetes, controlled, with neuropathy 03/11/2012  . OSA (obstructive sleep apnea) 03/11/2012  . CKD (chronic kidney disease) stage 3, GFR 30-59 ml/min 03/11/2012  . Migraines 03/11/2012    Joelie Schou, PT 03-18-15, 8:44 AM  [facility] [address] [place], [name], [address] Phone: [phone]   Fax:  [phone]

## 2015-03-04 ENCOUNTER — Ambulatory Visit: Payer: Medicare Other | Admitting: Occupational Therapy

## 2015-03-04 ENCOUNTER — Encounter: Payer: Self-pay | Admitting: Occupational Therapy

## 2015-03-04 DIAGNOSIS — R269 Unspecified abnormalities of gait and mobility: Secondary | ICD-10-CM | POA: Diagnosis not present

## 2015-03-04 DIAGNOSIS — I69951 Hemiplegia and hemiparesis following unspecified cerebrovascular disease affecting right dominant side: Secondary | ICD-10-CM | POA: Diagnosis not present

## 2015-03-04 DIAGNOSIS — M25511 Pain in right shoulder: Secondary | ICD-10-CM

## 2015-03-04 DIAGNOSIS — R279 Unspecified lack of coordination: Secondary | ICD-10-CM | POA: Diagnosis not present

## 2015-03-04 DIAGNOSIS — I6980 Unspecified sequelae of other cerebrovascular disease: Secondary | ICD-10-CM | POA: Diagnosis not present

## 2015-03-04 DIAGNOSIS — M6281 Muscle weakness (generalized): Secondary | ICD-10-CM | POA: Diagnosis not present

## 2015-03-04 DIAGNOSIS — R531 Weakness: Secondary | ICD-10-CM | POA: Diagnosis not present

## 2015-03-04 NOTE — Patient Instructions (Addendum)
Home Exercise Program for Your Arm: Do these 2 times a day every day!! You should never have pain in your shoulder when you do these. Respect your limits and GO SLOW.  The slower you go the harder the arm works!!  If your hand gets tight, stop and let it relax then continue.   1. Sit on a firm surface. Keep your feet apart. Clasp hands together. Slowly reach for the floor and relax into the stretch. Hold for a slow count of 5. Then keeping elbows straight, use both hands to reach forward as far as your pain will allow. Return to hands then sit back up. Do 10 in the morning and 10 at night. THERE SHOULD BE NO PAIN WITH THIS. Make sure your breath and focus on grading your effort - enough effort to get the job done and no more!!  You can also do this through the day if the arm gets tight.   2.  Lay on your back.  Hold small ball between your hands.  Put a little pressure on the ball from the heels of your hand. Hold ball on your chest.  Reach straight up toward the ceiling then back down to your chest.  Do 10, rest then do 10 more.  3.  Lay on your back. Hold small ball between your hands.  Put a little pressure on the ball from the heels of your hand. Hold the ball on your chest. Reach straight up toward the ceiling until elbows are straight.  Keeping elbows straight, raise arms as far as you can over your head, then reach for knees.  Do 10, rest then do 10 more.  4. Practice assisted low reach with grasp and release at home 5-10 minutes 2 times per day

## 2015-03-04 NOTE — Therapy (Signed)
[facility] [address] [place], [name], [address] Phone: [phone]   Fax:  [phone]  Occupational Therapy Treatment  Patient Details  Name: James Bell MRN: [id number] Date of Birth: 09-04-1931 Referring Provider:  [name], MD  Encounter Date: 03/04/2015      OT End of Session - 03/04/15 1112    Visit Number 3   Number of Visits 16   Date for OT Re-Evaluation 04/13/15   Authorization Type MCR trad, tricare - G code needed   OT Start Time 0931   OT Stop Time 1015   OT Time Calculation (min) 44 min   Activity Tolerance Patient tolerated treatment well      Past Medical History  Diagnosis Date  . HLD (hyperlipidemia)   . Restless leg syndrome     well controlled on mirapex  . OSA on CPAP     sleep study 01/2014 - severe, CPAP at 10, previously on nuvigil (Dr. [name])  . Type 2 diabetes, controlled, with neuropathy 2011  . H/O hiatal hernia   . GERD (gastroesophageal reflux disease)     with sliding HH  . Migraines 03/11/12    now rare  . CVA (cerebral infarction) 03/07/12    slurred speech; right hemplegia, left handed - completed PT 07/2014 (17 sessions)  . Basal cell carcinoma of nose 2002    bridge of nose  . History of kidney problems 12/2010    lacerated left kidney after fall  . BPH with obstruction/lower urinary tract symptoms     failed flomax, uroxatral, myrbetriq - sees urology [name]) pending videourodynamics  . Depression     mild, on cymbalta per prior PCP  . Palpitations 01/2012    holter WNL, rare PAC/PVCs  . Erectile dysfunction     per urology (prostaglandin injection)  . Kidney cysts 02/2014    bilateral (renal US)  . CKD (chronic kidney disease) stage 3, GFR 30-59 ml/min 03/11/2012    Renal US 02/2014 - multiple kidney cysts   . TIA (transient ischemic attack)   . Anemia     Past Surgical History  Procedure Laterality Date  . Prostate surgery  02/2009     PVP (laser)  . Tonsillectomy and adenoidectomy  ~ 1945  . Hemorrhoid surgery  1991  . Cataract extraction w/ intraocular lens implant  11//2012 - 08/2011  . Inguinal hernia repair  1981; 2002    left, then repaired  . Skin cancer excision  2002    bridge of nose, BCC  . Colonoscopy  05/2011    normal per pt (Dr. [name], [place], [name])  . Mri brain  04/2010    WNL  . Esophagogastroduodenoscopy  2006    duodenitis, medual HH, Grade 2 reflux, mild gastritis  . Colonoscopy  03/2011    poor prep, unable to biopsy polyp in tic fold, rpt 3 mo [name])  . Abi  2007    WNL  . Urethral dilation  2014    tannenbaum  . Cystoscopy with urethral dilatation N/A 07/24/2013    Procedure: CYSTOSCOPY WITH URETHRAL DILATATION;  Surgeon: [name], MD;  Location: [facility];  Service: Urology;  Laterality: N/A;  . Transurethral incision of prostate N/A 07/24/2013    Procedure: TRANSURETHRAL INCISION OF THE PROSTATE (TUIP);  Surgeon: [name], MD;  Location: [facility];  Service: Urology;  Laterality: N/A;  . Esophagogastroduodenoscopy  07/2011    mild chronic gastritis  . Colonoscopy  06/2011    mult polyps adenomatous, severe diverticulosis, rpt 3 yrs [name])  . Carotid endarterectomy    . Cardiac catheterization  2004    normal; [facility]  . Cardiovascular stress test  02/2014    WNL, low risk scan, EF 68%    There were no vitals filed for this visit.  Visit Diagnosis:  Hemiplegia affecting right side in right-dominant patient as late effect of cerebrovascular disease  Pain in joint, shoulder region, right      Subjective Assessment - 03/04/15 0936    Subjective  I feel pretty good from the other day (last OT session)   Pertinent History see epic snapshot;  pt with H/o of CVA   Patient Stated Goals I have made alot of progress but would like to be able to do even more.   Currently in Pain? Yes   Pain Score 3    Pain Location Teeth  pt has recent dental work                       OT Treatments/Exercises (OP) - 03/04/15 0001    Neurological Re-education Exercises   Other Exercises 1 Neuro re ed to address shoulder flexion in prep for active reach in supine decreasing abnormal movement of IR, elbow flexion with closed hand. Pt given HEP  consisting of activities - see pt instruction for details. Pt able to return demonstrate all activities. Also incoprorated active assisted reach to mid reach with functional grasp and release. Pt to practice assisted low reach at home.                OT Education - 03/04/15 1110    Education provided Yes   Education Details updated HEP for arm   Person(s) Educated Patient   Methods Explanation;Demonstration;Verbal cues;Handout;Tactile cues   Comprehension Verbalized understanding;Returned demonstration          OT Short Term Goals - 03/04/15 1110    OT SHORT TERM GOAL #1   Title Pt will be mod I with HEP - 03/16/2015   Status On-going   OT SHORT TERM GOAL #2   Title Pt will demonstrate ability to complete mid level reach to pick up light object off shelf   Status On-going   OT SHORT TERM GOAL #3   Title Pt will report no greater than 2/10 pain in R shoulder with shoulder flexion to 130* and abduction to 100* in supine.   Status On-going   OT SHORT TERM GOAL #4   Title Pt will demonstrate understanding of strategies to compensate for tying and zipping with AE prn   Status On-going           OT Long Term Goals - 03/04/15 1111    OT LONG TERM GOAL #1   Title Pt will be mod I with upgraded HEP prn - 04/13/2015   Status On-going   OT LONG TERM GOAL #2   Title Pt will demonstrate ability for overhead reach to obtain light object off shelf   Status On-going   OT LONG TERM GOAL #3   Title Pt will demonstrate improved coordination as evidenced by completing entire 9 hole peg test in 2 minutes (baseline= 4 pegs)   Status On-going   OT LONG TERM GOAL #4   Title Pt will be able to  write name with AE prn with R hand legibily   Status On-going   OT LONG TERM GOAL #5   Title Pt will  demonstrate ability to use RUE as non dominant for basic ADL' tasks (baseline= gross assist)   Status On-going   OT LONG TERM GOAL #6   Title Pt will be able to use RUE in cooking task as non dominant for at least 50% of the task.    Status On-going               Plan - 03/04/15 1111    Clinical Impression Statement Pt making good progress toward goals. Pt able to return demonstrate all HEP activities.   Pt will benefit from skilled therapeutic intervention in order to improve on the following deficits (Retired) Decreased balance;Decreased coordination;Decreased mobility;Decreased range of motion;Decreased strength;Impaired UE functional use;Impaired tone;Impaired flexibility;Pain;Difficulty walking   Rehab Potential Good   Clinical Impairments Affecting Rehab Potential Pt will benefit from skilled OT to address the above deficits.   OT Frequency 2x / week   OT Duration 8 weeks   OT Treatment/Interventions Self-care/ADL training;Moist Heat;DME and/or AE instruction;Neuromuscular education;Therapeutic exercise;Functional Mobility Training;Manual Therapy;Passive range of motion;Therapeutic activities;Balance training;Patient/family education   Plan check upgraded HEP, neuro re ed to address RUE   Consulted and Agree with Plan of Care Patient        Problem List Patient Active Problem List   Diagnosis Date Noted  . Spastic hemiparesis affecting dominant side 07/07/2014  . Right leg swelling 06/09/2014  . Left shoulder pain 03/16/2014  . Chest pressure 02/03/2014  . Spastic hemiplegia affecting dominant side 11/07/2013  . Medicare annual wellness visit, subsequent 11/06/2013  . Erectile dysfunction due to arterial insufficiency 02/05/2013  . Depression   . Palpitations 06/26/2012  . HLD (hyperlipidemia)   . Restless leg syndrome   . GERD (gastroesophageal reflux disease)    . BPH with obstruction/lower urinary tract symptoms   . CVA (cerebrovascular accident) 03/11/2012  . Right hemiparesis 03/11/2012  . Type 2 diabetes, controlled, with neuropathy 03/11/2012  . OSA (obstructive sleep apnea) 03/11/2012  . CKD (chronic kidney disease) stage 3, GFR 30-59 ml/min 03/11/2012  . Migraines 03/11/2012    Quay Burow, OTR/L 03/04/2015, 11:13 AM  [facility] [address] [place], [name], [address] Phone: [phone]   Fax:  [phone]

## 2015-03-05 ENCOUNTER — Other Ambulatory Visit: Payer: Self-pay | Admitting: Family Medicine

## 2015-03-09 ENCOUNTER — Ambulatory Visit: Payer: Medicare Other | Admitting: Occupational Therapy

## 2015-03-09 ENCOUNTER — Encounter: Payer: Self-pay | Admitting: Occupational Therapy

## 2015-03-09 DIAGNOSIS — R531 Weakness: Secondary | ICD-10-CM | POA: Diagnosis not present

## 2015-03-09 DIAGNOSIS — M6281 Muscle weakness (generalized): Secondary | ICD-10-CM | POA: Diagnosis not present

## 2015-03-09 DIAGNOSIS — R269 Unspecified abnormalities of gait and mobility: Secondary | ICD-10-CM | POA: Diagnosis not present

## 2015-03-09 DIAGNOSIS — R279 Unspecified lack of coordination: Secondary | ICD-10-CM | POA: Diagnosis not present

## 2015-03-09 DIAGNOSIS — I69951 Hemiplegia and hemiparesis following unspecified cerebrovascular disease affecting right dominant side: Secondary | ICD-10-CM

## 2015-03-09 DIAGNOSIS — I6980 Unspecified sequelae of other cerebrovascular disease: Secondary | ICD-10-CM | POA: Diagnosis not present

## 2015-03-09 NOTE — Therapy (Signed)
[facility] [address] [place], [name], [address] Phone: [phone]   Fax:  [phone]  Occupational Therapy Treatment  Patient Details  Name: James Bell MRN: [id number] Date of Birth: June 15, 1931 Referring Provider:  [name], MD  Encounter Date: 03/09/2015      OT End of Session - 03/09/15 0943    Visit Number 4   Date for OT Re-Evaluation 04/13/15   Authorization Type MCR trad, tricare - G code needed   OT Start Time 0845   OT Stop Time 0931   OT Time Calculation (min) 46 min   Activity Tolerance Patient tolerated treatment well      Past Medical History  Diagnosis Date  . HLD (hyperlipidemia)   . Restless leg syndrome     well controlled on mirapex  . OSA on CPAP     sleep study 01/2014 - severe, CPAP at 10, previously on nuvigil (Dr. [name])  . Type 2 diabetes, controlled, with neuropathy 2011  . H/O hiatal hernia   . GERD (gastroesophageal reflux disease)     with sliding HH  . Migraines 03/11/12    now rare  . CVA (cerebral infarction) 03/07/12    slurred speech; right hemplegia, left handed - completed PT 07/2014 (17 sessions)  . Basal cell carcinoma of nose 2002    bridge of nose  . History of kidney problems 12/2010    lacerated left kidney after fall  . BPH with obstruction/lower urinary tract symptoms     failed flomax, uroxatral, myrbetriq - sees urology [name]) pending videourodynamics  . Depression     mild, on cymbalta per prior PCP  . Palpitations 01/2012    holter WNL, rare PAC/PVCs  . Erectile dysfunction     per urology (prostaglandin injection)  . Kidney cysts 02/2014    bilateral (renal US)  . CKD (chronic kidney disease) stage 3, GFR 30-59 ml/min 03/11/2012    Renal US 02/2014 - multiple kidney cysts   . TIA (transient ischemic attack)   . Anemia     Past Surgical History  Procedure Laterality Date  . Prostate surgery  02/2009    PVP (laser)  .  Tonsillectomy and adenoidectomy  ~ 1945  . Hemorrhoid surgery  1991  . Cataract extraction w/ intraocular lens implant  11//2012 - 08/2011  . Inguinal hernia repair  1981; 2002    left, then repaired  . Skin cancer excision  2002    bridge of nose, BCC  . Colonoscopy  05/2011    normal per pt (Dr. [name], [place], [name])  . Mri brain  04/2010    WNL  . Esophagogastroduodenoscopy  2006    duodenitis, medual HH, Grade 2 reflux, mild gastritis  . Colonoscopy  03/2011    poor prep, unable to biopsy polyp in tic fold, rpt 3 mo [name])  . Abi  2007    WNL  . Urethral dilation  2014    tannenbaum  . Cystoscopy with urethral dilatation N/A 07/24/2013    Procedure: CYSTOSCOPY WITH URETHRAL DILATATION;  Surgeon: [name], MD;  Location: [facility];  Service: Urology;  Laterality: N/A;  . Transurethral incision of prostate N/A 07/24/2013    Procedure: TRANSURETHRAL INCISION OF THE PROSTATE (TUIP);  Surgeon: [name], MD;  Location: [facility];  Service: Urology;  Laterality: N/A;  . Esophagogastroduodenoscopy  07/2011    mild chronic gastritis  . Colonoscopy  06/2011    mult polyps  adenomatous, severe diverticulosis, rpt 3 yrs [name])  . Carotid endarterectomy    . Cardiac catheterization  2004    normal; [facility]  . Cardiovascular stress test  02/2014    WNL, low risk scan, EF 68%    There were no vitals filed for this visit.  Visit Diagnosis:  Hemiplegia affecting right side in right-dominant patient as late effect of cerebrovascular disease  Muscle weakness      Subjective Assessment - 03/09/15 0849    Subjective  I got the ball and have started my exercises at home.   Pertinent History see epic snapshot;  pt with H/o of CVA   Patient Stated Goals I have made alot of progress but would like to be able to do even more.   Currently in Pain? No/denies                      OT Treatments/Exercises (OP) - 03/09/15 0001    Neurological Re-education  Exercises   Other Exercises 1 Neuro re ed in supine and sitting to address components of mid and high level reach with wrist in slight extension and working torward more open hand and decreased IR with elbow flexion. Pt with much better ability to override synergy patterns and in supine pt now can perform overheach reach in closed chain activity to 140* as well as 105* of abduction with no shoulder pain.  Pt able to complete 90* of bilateral reach in closed chain activity in sitting as well and with scapular facilitation (min) pt able to achieve 140* bilateral shoulder flexion closed chain.  Open chained pt can perform mid level reach to approximately 60* shoulder flexion relatively free of synergy influence.Also addressed scapular stability with resistance and postural control in sitting and standing.                  OT Short Term Goals - 03/09/15 0940    OT SHORT TERM GOAL #1   Title Pt will be mod I with HEP - 03/16/2015   Status On-going   OT SHORT TERM GOAL #2   Title Pt will demonstrate ability to complete mid level reach to pick up light object off shelf   Status On-going   OT SHORT TERM GOAL #3   Title Pt will report no greater than 2/10 pain in R shoulder with shoulder flexion to 130* and abduction to 100* in supine.   Status Achieved  140* shoulder flexion/105* abduction with no pain   OT SHORT TERM GOAL #4   Title Pt will demonstrate understanding of strategies to compensate for tying and zipping with AE prn   Status On-going           OT Long Term Goals - 03/09/15 0941    OT LONG TERM GOAL #1   Title Pt will be mod I with upgraded HEP prn - 04/13/2015   Status On-going   OT LONG TERM GOAL #2   Title Pt will demonstrate ability for overhead reach to obtain light object off shelf   Status On-going   OT LONG TERM GOAL #3   Title Pt will demonstrate improved coordination as evidenced by completing entire 9 hole peg test in 2 minutes (baseline= 4 pegs)   Status  On-going   OT LONG TERM GOAL #4   Title Pt will be able to write name with AE prn with R hand legibily   Status On-going   OT LONG TERM GOAL #5  Title Pt will demonstrate ability to use RUE as non dominant for basic ADL' tasks (baseline= gross assist)   Status On-going   OT LONG TERM GOAL #6   Title Pt will be able to use RUE in cooking task as non dominant for at least 50% of the task.    Status On-going               Plan - 03/09/15 0941    Clinical Impression Statement Pt is making excellent progress toward goals and is very mottivated to improve use of RUE. Pt reports he can now use his R hand to tuck his shirt in the back for the first time since his stroke.   Pt will benefit from skilled therapeutic intervention in order to improve on the following deficits (Retired) Decreased balance;Decreased coordination;Decreased mobility;Decreased range of motion;Decreased strength;Impaired UE functional use;Impaired tone;Impaired flexibility;Pain;Difficulty walking   Rehab Potential Good   Clinical Impairments Affecting Rehab Potential Pt will benefit from skilled OT to address the above deficits.   OT Frequency 2x / week   OT Duration 8 weeks   OT Treatment/Interventions Self-care/ADL training;Moist Heat;DME and/or AE instruction;Neuromuscular education;Therapeutic exercise;Functional Mobility Training;Manual Therapy;Passive range of motion;Therapeutic activities;Balance training;Patient/family education   Plan neuro re ed to addresss RUE, add scap exercise to HEP if pt able to demonstrate safely   Consulted and Agree with Plan of Care Patient        Problem List Patient Active Problem List   Diagnosis Date Noted  . Spastic hemiparesis affecting dominant side 07/07/2014  . Right leg swelling 06/09/2014  . Left shoulder pain 03/16/2014  . Chest pressure 02/03/2014  . Spastic hemiplegia affecting dominant side 11/07/2013  . Medicare annual wellness visit, subsequent 11/06/2013   . Erectile dysfunction due to arterial insufficiency 02/05/2013  . Depression   . Palpitations 06/26/2012  . HLD (hyperlipidemia)   . Restless leg syndrome   . GERD (gastroesophageal reflux disease)   . BPH with obstruction/lower urinary tract symptoms   . CVA (cerebrovascular accident) 03/11/2012  . Right hemiparesis 03/11/2012  . Type 2 diabetes, controlled, with neuropathy 03/11/2012  . OSA (obstructive sleep apnea) 03/11/2012  . CKD (chronic kidney disease) stage 3, GFR 30-59 ml/min 03/11/2012  . Migraines 03/11/2012    Quay Burow, OTR/L 03/09/2015, 9:44 AM  [facility] [address] [place], [name], [address] Phone: [phone]   Fax:  [phone]

## 2015-03-11 ENCOUNTER — Encounter: Payer: Self-pay | Admitting: Occupational Therapy

## 2015-03-11 ENCOUNTER — Ambulatory Visit: Payer: Medicare Other | Admitting: Occupational Therapy

## 2015-03-11 DIAGNOSIS — R269 Unspecified abnormalities of gait and mobility: Secondary | ICD-10-CM | POA: Diagnosis not present

## 2015-03-11 DIAGNOSIS — M6281 Muscle weakness (generalized): Secondary | ICD-10-CM | POA: Diagnosis not present

## 2015-03-11 DIAGNOSIS — R531 Weakness: Secondary | ICD-10-CM | POA: Diagnosis not present

## 2015-03-11 DIAGNOSIS — I69951 Hemiplegia and hemiparesis following unspecified cerebrovascular disease affecting right dominant side: Secondary | ICD-10-CM

## 2015-03-11 DIAGNOSIS — R279 Unspecified lack of coordination: Secondary | ICD-10-CM | POA: Diagnosis not present

## 2015-03-11 DIAGNOSIS — I6980 Unspecified sequelae of other cerebrovascular disease: Secondary | ICD-10-CM | POA: Diagnosis not present

## 2015-03-11 NOTE — Patient Instructions (Signed)
Pt instructed to begin monitoring the following:  1. Consciously relaxing LUE when standing and walking as pt is able to over ride this with cues. 2. Making sure when doing activities in standing that pt has equal weight on both LE's 3.  Sit to stand/stand to sit in midline without use of RUE to allow improved postural alignment and activation of L side.

## 2015-03-11 NOTE — Therapy (Signed)
[facility] [address] [place], [name], [address] Phone: [phone]   Fax:  [phone]  Occupational Therapy Treatment  Patient Details  Name: James Bell MRN: [id number] Date of Birth: Jan 30, 1931 Referring Provider:  [name], MD  Encounter Date: 03/11/2015      OT End of Session - 03/11/15 1053    Visit Number 5   Number of Visits 16   Date for OT Re-Evaluation 04/13/15   Authorization Type MCR trad, tricare - G code needed   OT Start Time 0845   OT Stop Time 0929   OT Time Calculation (min) 44 min   Activity Tolerance Patient tolerated treatment well      Past Medical History  Diagnosis Date  . HLD (hyperlipidemia)   . Restless leg syndrome     well controlled on mirapex  . OSA on CPAP     sleep study 01/2014 - severe, CPAP at 10, previously on nuvigil (Dr. [name])  . Type 2 diabetes, controlled, with neuropathy 2011  . H/O hiatal hernia   . GERD (gastroesophageal reflux disease)     with sliding HH  . Migraines 03/11/12    now rare  . CVA (cerebral infarction) 03/07/12    slurred speech; right hemplegia, left handed - completed PT 07/2014 (17 sessions)  . Basal cell carcinoma of nose 2002    bridge of nose  . History of kidney problems 12/2010    lacerated left kidney after fall  . BPH with obstruction/lower urinary tract symptoms     failed flomax, uroxatral, myrbetriq - sees urology [name]) pending videourodynamics  . Depression     mild, on cymbalta per prior PCP  . Palpitations 01/2012    holter WNL, rare PAC/PVCs  . Erectile dysfunction     per urology (prostaglandin injection)  . Kidney cysts 02/2014    bilateral (renal US)  . CKD (chronic kidney disease) stage 3, GFR 30-59 ml/min 03/11/2012    Renal US 02/2014 - multiple kidney cysts   . TIA (transient ischemic attack)   . Anemia     Past Surgical History  Procedure Laterality Date  . Prostate surgery  02/2009     PVP (laser)  . Tonsillectomy and adenoidectomy  ~ 1945  . Hemorrhoid surgery  1991  . Cataract extraction w/ intraocular lens implant  11//2012 - 08/2011  . Inguinal hernia repair  1981; 2002    left, then repaired  . Skin cancer excision  2002    bridge of nose, BCC  . Colonoscopy  05/2011    normal per pt (Dr. [name], [place], [name])  . Mri brain  04/2010    WNL  . Esophagogastroduodenoscopy  2006    duodenitis, medual HH, Grade 2 reflux, mild gastritis  . Colonoscopy  03/2011    poor prep, unable to biopsy polyp in tic fold, rpt 3 mo [name])  . Abi  2007    WNL  . Urethral dilation  2014    tannenbaum  . Cystoscopy with urethral dilatation N/A 07/24/2013    Procedure: CYSTOSCOPY WITH URETHRAL DILATATION;  Surgeon: [name], MD;  Location: [facility];  Service: Urology;  Laterality: N/A;  . Transurethral incision of prostate N/A 07/24/2013    Procedure: TRANSURETHRAL INCISION OF THE PROSTATE (TUIP);  Surgeon: [name], MD;  Location: [facility];  Service: Urology;  Laterality: N/A;  . Esophagogastroduodenoscopy  07/2011    mild chronic gastritis  . Colonoscopy  06/2011    mult polyps adenomatous, severe diverticulosis, rpt 3 yrs [name])  . Carotid endarterectomy    . Cardiac catheterization  2004    normal; [facility]  . Cardiovascular stress test  02/2014    WNL, low risk scan, EF 68%    There were no vitals filed for this visit.  Visit Diagnosis:  Hemiplegia affecting right side in right-dominant patient as late effect of cerebrovascular disease  Muscle weakness      Subjective Assessment - 03/11/15 0851    Subjective  I am thinking about trying to use my right hand more instead of doing everything with my left hand. I can pick up things if they are a low reach and that is new.   Pertinent History see epic snapshot;  pt with H/o of CVA   Patient Stated Goals I have made alot of progress but would like to be able to do even more.   Currently in Pain?  No/denies                      OT Treatments/Exercises (OP) - 03/11/15 0001    ADLs   ADL Comments Demonstrated AE for tying shoes, cutting meat and donning TED hose. Pt given information on how to obtain equipment. Pt able to return demonstrate and verbalized understanding of equipment.    Neurological Re-education Exercises   Other Exercises 1 Neuro re ed to address low to mid level reach with LUE as well as to address loading LLE in standing and improved postural alignment in standing, sit to stand and stand to sit with emphasis on improved LUE control and use with this alignment and activitation.                 OT Education - 03/11/15 1050    Education provided Yes   Education Details added to OT HEP   Person(s) Educated Patient   Methods Explanation;Demonstration;Tactile cues;Verbal cues;Handout   Comprehension Verbalized understanding;Returned demonstration          OT Short Term Goals - 03/11/15 1050    OT SHORT TERM GOAL #1   Title Pt will be mod I with HEP - 03/16/2015   Status On-going   OT SHORT TERM GOAL #2   Title Pt will demonstrate ability to complete mid level reach to pick up light object off shelf   Status On-going   OT SHORT TERM GOAL #3   Title Pt will report no greater than 2/10 pain in R shoulder with shoulder flexion to 130* and abduction to 100* in supine.   Status Achieved  140* shoulder flexion/105* abduction with no pain   OT SHORT TERM GOAL #4   Title Pt will demonstrate understanding of strategies to compensate for tying and zipping with AE prn   Status Achieved           OT Long Term Goals - 03/11/15 1051    OT LONG TERM GOAL #1   Title Pt will be mod I with upgraded HEP prn - 04/13/2015   Status On-going   OT LONG TERM GOAL #2   Title Pt will demonstrate ability for overhead reach to obtain light object off shelf   Status On-going   OT LONG TERM GOAL #3   Title Pt will demonstrate improved coordination as evidenced  by completing entire 9 hole peg test in 2 minutes (baseline= 4 pegs)   Status On-going   OT LONG TERM GOAL #4   Title  Pt will be able to write name with AE prn with R hand legibily   Status On-going   OT LONG TERM GOAL #5   Title Pt will demonstrate ability to use RUE as non dominant for basic ADL' tasks (baseline= gross assist)   Status On-going   OT LONG TERM GOAL #6   Title Pt will be able to use RUE in cooking task as non dominant for at least 50% of the task.    Status On-going               Plan - 03/11/15 1051    Clinical Impression Statement Pt making great progress toward goals; pt reports he is making a much greater effort to use his LUE at home during functional tasks (i.e low to beginning level reach, opening refrdgerator door, grasping with L hand, tucking in shirt with both hands).    Pt will benefit from skilled therapeutic intervention in order to improve on the following deficits (Retired) Decreased balance;Decreased coordination;Decreased mobility;Decreased range of motion;Decreased strength;Impaired UE functional use;Impaired tone;Impaired flexibility;Pain;Difficulty walking   Rehab Potential Good   Clinical Impairments Affecting Rehab Potential Pt will benefit from skilled OT to address the above deficits.   OT Frequency 2x / week   OT Duration 8 weeks   OT Treatment/Interventions Self-care/ADL training;Moist Heat;DME and/or AE instruction;Neuromuscular education;Therapeutic exercise;Functional Mobility Training;Manual Therapy;Passive range of motion;Therapeutic activities;Balance training;Patient/family education   Plan neuro re ed to address postural alignment and control, scapular control, functional reach   Consulted and Agree with Plan of Care Patient        Problem List Patient Active Problem List   Diagnosis Date Noted  . Spastic hemiparesis affecting dominant side 07/07/2014  . Right leg swelling 06/09/2014  . Left shoulder pain 03/16/2014  .  Chest pressure 02/03/2014  . Spastic hemiplegia affecting dominant side 11/07/2013  . Medicare annual wellness visit, subsequent 11/06/2013  . Erectile dysfunction due to arterial insufficiency 02/05/2013  . Depression   . Palpitations 06/26/2012  . HLD (hyperlipidemia)   . Restless leg syndrome   . GERD (gastroesophageal reflux disease)   . BPH with obstruction/lower urinary tract symptoms   . CVA (cerebrovascular accident) 03/11/2012  . Right hemiparesis 03/11/2012  . Type 2 diabetes, controlled, with neuropathy 03/11/2012  . OSA (obstructive sleep apnea) 03/11/2012  . CKD (chronic kidney disease) stage 3, GFR 30-59 ml/min 03/11/2012  . Migraines 03/11/2012    Quay Burow, OTR/L 03/11/2015, 10:54 AM  [facility] [address] [place], [name], [address] Phone: [phone]   Fax:  [phone]

## 2015-03-16 ENCOUNTER — Ambulatory Visit: Payer: Medicare Other

## 2015-03-16 ENCOUNTER — Ambulatory Visit: Payer: Medicare Other | Admitting: Occupational Therapy

## 2015-03-16 ENCOUNTER — Encounter: Payer: Self-pay | Admitting: Occupational Therapy

## 2015-03-16 DIAGNOSIS — M6281 Muscle weakness (generalized): Secondary | ICD-10-CM

## 2015-03-16 DIAGNOSIS — IMO0002 Reserved for concepts with insufficient information to code with codable children: Secondary | ICD-10-CM

## 2015-03-16 DIAGNOSIS — I69951 Hemiplegia and hemiparesis following unspecified cerebrovascular disease affecting right dominant side: Secondary | ICD-10-CM

## 2015-03-16 DIAGNOSIS — R531 Weakness: Secondary | ICD-10-CM

## 2015-03-16 DIAGNOSIS — R269 Unspecified abnormalities of gait and mobility: Secondary | ICD-10-CM

## 2015-03-16 NOTE — Therapy (Signed)
[facility] [address] [place], [name], [address] Phone: [phone]   Fax:  [phone]  Physical Therapy Treatment  Patient Details  Name: James Bell MRN: [id number] Date of Birth: Oct 19, 1930 Referring Provider:  [name], MD  Encounter Date: 03/16/2015      PT End of Session - 03/16/15 1351    Visit Number 2   Number of Visits 12   Date for PT Re-Evaluation 04/30/15   Authorization Type G-code required every 10th visit for Medicare.   PT Start Time 0804   PT Stop Time 0845   PT Time Calculation (min) 41 min   Equipment Utilized During Treatment Gait belt   Activity Tolerance Patient tolerated treatment well   Behavior During Therapy WFL for tasks assessed/performed      Past Medical History  Diagnosis Date  . HLD (hyperlipidemia)   . Restless leg syndrome     well controlled on mirapex  . OSA on CPAP     sleep study 01/2014 - severe, CPAP at 10, previously on nuvigil (Dr. [name])  . Type 2 diabetes, controlled, with neuropathy 2011  . H/O hiatal hernia   . GERD (gastroesophageal reflux disease)     with sliding HH  . Migraines 03/11/12    now rare  . CVA (cerebral infarction) 03/07/12    slurred speech; right hemplegia, left handed - completed PT 07/2014 (17 sessions)  . Basal cell carcinoma of nose 2002    bridge of nose  . History of kidney problems 12/2010    lacerated left kidney after fall  . BPH with obstruction/lower urinary tract symptoms     failed flomax, uroxatral, myrbetriq - sees urology [name]) pending videourodynamics  . Depression     mild, on cymbalta per prior PCP  . Palpitations 01/2012    holter WNL, rare PAC/PVCs  . Erectile dysfunction     per urology (prostaglandin injection)  . Kidney cysts 02/2014    bilateral (renal US)  . CKD (chronic kidney disease) stage 3, GFR 30-59 ml/min 03/11/2012    Renal US 02/2014 - multiple kidney cysts   . TIA  (transient ischemic attack)   . Anemia     Past Surgical History  Procedure Laterality Date  . Prostate surgery  02/2009    PVP (laser)  . Tonsillectomy and adenoidectomy  ~ 1945  . Hemorrhoid surgery  1991  . Cataract extraction w/ intraocular lens implant  11//2012 - 08/2011  . Inguinal hernia repair  1981; 2002    left, then repaired  . Skin cancer excision  2002    bridge of nose, BCC  . Colonoscopy  05/2011    normal per pt (Dr. [name], [place], [name])  . Mri brain  04/2010    WNL  . Esophagogastroduodenoscopy  2006    duodenitis, medual HH, Grade 2 reflux, mild gastritis  . Colonoscopy  03/2011    poor prep, unable to biopsy polyp in tic fold, rpt 3 mo [name])  . Abi  2007    WNL  . Urethral dilation  2014    tannenbaum  . Cystoscopy with urethral dilatation N/A 07/24/2013    Procedure: CYSTOSCOPY WITH URETHRAL DILATATION;  Surgeon: [name], MD;  Location: [facility];  Service: Urology;  Laterality: N/A;  . Transurethral incision of prostate N/A 07/24/2013    Procedure: TRANSURETHRAL INCISION OF THE PROSTATE (TUIP);  Surgeon: [name], MD;  Location: [facility];  Service: Urology;  Laterality: N/A;  . Esophagogastroduodenoscopy  07/2011    mild chronic gastritis  . Colonoscopy  06/2011    mult polyps adenomatous, severe diverticulosis, rpt 3 yrs [name])  . Carotid endarterectomy    . Cardiac catheterization  2004    normal; [facility]  . Cardiovascular stress test  02/2014    WNL, low risk scan, EF 68%    There were no vitals filed for this visit.  Visit Diagnosis:  Abnormality of gait  Muscle weakness  Generalized weakness      Subjective Assessment - 03/16/15 0806    Subjective Pt denied falls or changes since last visit. Pt has been performing sit<>stand but he has not been performing balance exercises. Pt uses a NuStep at home for approx. 10-15 minutes a day.    Patient Stated Goals Return to walking more confidently and improve balance.    Currently in Pain? No/denies      Therex and Neuro re-ed: Pt performed strengthening and balance HEP with supervision to min guard. VC's and demonstration for technique. Please see pt instructions for details.                           PT Education - 03/16/15 1350    Education provided Yes   Education Details Strengthening and balance HEP.   Person(s) Educated Patient   Methods Explanation;Demonstration;Verbal cues;Handout   Comprehension Returned demonstration;Verbalized understanding          PT Short Term Goals - 03/16/15 1354    PT SHORT TERM GOAL #1   Title Pt will be independent in HEP to improve strength and functional mobility.    Baseline Target date 03/31/2015   Time 4   Period Weeks   Status On-going   PT SHORT TERM GOAL #2   Title Pt will improve Berg from 42/56 up to > or equal to 45/56 to demo decreasing risk for falls.   Baseline Target date 03/31/2015   Time 4   Period Weeks   Status On-going   PT SHORT TERM GOAL #3   Title The patient will increase gait speed from 1.85 ft/sec up to 2.2 ft/sec to demo improving gait speed for functional mobility.   Baseline Target date 03/31/2015   Time 4   Period Weeks   Status On-going   PT SHORT TERM GOAL #4   Title Pt will ambulate > or equal to 400 ft nonstop on level/unlevel surfaces with LRAD modified indep to demo improving endurance.   Baseline Target date 03/31/2015   Time 4   Period Weeks   Status On-going           PT Long Term Goals - 03/16/15 1354    PT LONG TERM GOAL #1   Title Pt will be able to verbalize understanding of falls prevention strategies to reduce risk of falls.    Baseline Target date 04/30/2015   Time 8   Period Weeks   Status On-going   PT LONG TERM GOAL #2   Title Patient will improve Berg from 42/56 up to > or equal to 49/56 to demo decreased risk for fall.   Baseline Target date 04/30/2015   Time 8   Period Weeks   Status On-going   PT LONG TERM GOAL #3    Title The patient will increase gait speed from 1.85 ft/sec up to 2.6 ft/sec to demo improving functional mobility.   Baseline Target date 04/30/2015   Time 8  Period Weeks   Status On-going   PT LONG TERM GOAL #4   Title The patient will ambulate for 10 minutes nonstop to demo improved endurance for functional mobility.   Baseline Target date 04/30/2015   Time 8   Period Weeks   Status On-going   PT LONG TERM GOAL #5   Title Pt will verbalize understanding of HEP for post d/c activities.   Baseline Target date 04/30/2015   Time 8   Period Weeks   Status On-going   PT LONG TERM GOAL #6   Title Pt will improve ABC scale from 53.8% up to 68%.   Baseline Target date 04/30/2015   Time 8   Period Weeks   Status On-going               Plan - 03/16/15 1351    Clinical Impression Statement Pt demonstrated improved shifting onto R LE with 2" block placed under L LE during sit<>stand transfers. Pt experienced increased postural sway and LOB during balance activities with eyes closed and dynamic balance activities, indicating he primarily uses vision to maintain balance. Continue with POC.   Pt will benefit from skilled therapeutic intervention in order to improve on the following deficits Abnormal gait;Decreased endurance;Decreased balance;Decreased mobility;Decreased strength;Decreased knowledge of use of DME;Decreased range of motion;Impaired flexibility;Impaired UE functional use;Postural dysfunction   Rehab Potential Good   PT Frequency 2x / week   PT Duration 4 weeks  followed by 1x/week for 4 weeks   PT Treatment/Interventions ADLs/Self Care Home Management;DME Instruction;Balance training;Manual techniques;Therapeutic exercise;Therapeutic activities;Patient/family education;Functional mobility training;Stair training;Gait training;Neuromuscular re-education   PT Next Visit Plan gait on treadmill with assist progressing speed and reciprocal nature   Consulted and Agree with Plan of  Care Patient        Problem List Patient Active Problem List   Diagnosis Date Noted  . Spastic hemiparesis affecting dominant side 07/07/2014  . Right leg swelling 06/09/2014  . Left shoulder pain 03/16/2014  . Chest pressure 02/03/2014  . Spastic hemiplegia affecting dominant side 11/07/2013  . Medicare annual wellness visit, subsequent 11/06/2013  . Erectile dysfunction due to arterial insufficiency 02/05/2013  . Depression   . Palpitations 06/26/2012  . HLD (hyperlipidemia)   . Restless leg syndrome   . GERD (gastroesophageal reflux disease)   . BPH with obstruction/lower urinary tract symptoms   . CVA (cerebrovascular accident) 03/11/2012  . Right hemiparesis 03/11/2012  . Type 2 diabetes, controlled, with neuropathy 03/11/2012  . OSA (obstructive sleep apnea) 03/11/2012  . CKD (chronic kidney disease) stage 3, GFR 30-59 ml/min 03/11/2012  . Migraines 03/11/2012    Kiyani Jernigan L 03/16/2015, 1:56 PM  [facility] [address] [place], [name], [address] Phone: [phone]   Fax:  [phone]     Geoffry Paradise, PT,DPT 03/16/2015 1:56 PM Phone: [phone] Fax: [phone]

## 2015-03-16 NOTE — Patient Instructions (Signed)
Pt given tracing tasks as prewriting skills;  Pt to do at home with large marker.

## 2015-03-16 NOTE — Therapy (Signed)
[facility] [address] [place], [name], [address] Phone: [phone]   Fax:  [phone]  Occupational Therapy Treatment  Patient Details  Name: James Bell MRN: [id number] Date of Birth: 1931-09-07 Referring Provider:  [name], MD  Encounter Date: 03/16/2015      OT End of Session - 03/16/15 0948    Visit Number 6   Number of Visits 16   Date for OT Re-Evaluation 04/13/15   Authorization Type MCR trad, tricare - G code needed   OT Start Time 0845   OT Stop Time 0932  pt became nauseated at approximately 0922 and then vomited.   OT Time Calculation (min) 47 min   Activity Tolerance Patient tolerated treatment well      Past Medical History  Diagnosis Date  . HLD (hyperlipidemia)   . Restless leg syndrome     well controlled on mirapex  . OSA on CPAP     sleep study 01/2014 - severe, CPAP at 10, previously on nuvigil (Dr. [name])  . Type 2 diabetes, controlled, with neuropathy 2011  . H/O hiatal hernia   . GERD (gastroesophageal reflux disease)     with sliding HH  . Migraines 03/11/12    now rare  . CVA (cerebral infarction) 03/07/12    slurred speech; right hemplegia, left handed - completed PT 07/2014 (17 sessions)  . Basal cell carcinoma of nose 2002    bridge of nose  . History of kidney problems 12/2010    lacerated left kidney after fall  . BPH with obstruction/lower urinary tract symptoms     failed flomax, uroxatral, myrbetriq - sees urology [name]) pending videourodynamics  . Depression     mild, on cymbalta per prior PCP  . Palpitations 01/2012    holter WNL, rare PAC/PVCs  . Erectile dysfunction     per urology (prostaglandin injection)  . Kidney cysts 02/2014    bilateral (renal US)  . CKD (chronic kidney disease) stage 3, GFR 30-59 ml/min 03/11/2012    Renal US 02/2014 - multiple kidney cysts   . TIA (transient ischemic attack)   . Anemia     Past Surgical  History  Procedure Laterality Date  . Prostate surgery  02/2009    PVP (laser)  . Tonsillectomy and adenoidectomy  ~ 1945  . Hemorrhoid surgery  1991  . Cataract extraction w/ intraocular lens implant  11//2012 - 08/2011  . Inguinal hernia repair  1981; 2002    left, then repaired  . Skin cancer excision  2002    bridge of nose, [facility]  . Colonoscopy  05/2011    normal per pt (Dr. [name], [place], [name])  . Mri brain  04/2010    WNL  . Esophagogastroduodenoscopy  2006    duodenitis, medual HH, Grade 2 reflux, mild gastritis  . Colonoscopy  03/2011    poor prep, unable to biopsy polyp in tic fold, rpt 3 mo [name])  . Abi  2007    WNL  . Urethral dilation  2014    tannenbaum  . Cystoscopy with urethral dilatation N/A 07/24/2013    Procedure: CYSTOSCOPY WITH URETHRAL DILATATION;  Surgeon: [name], MD;  Location: [facility];  Service: Urology;  Laterality: N/A;  . Transurethral incision of prostate N/A 07/24/2013    Procedure: TRANSURETHRAL INCISION OF THE PROSTATE (TUIP);  Surgeon: [name], MD;  Location: [facility];  Service: Urology;  Laterality: N/A;  . Esophagogastroduodenoscopy  07/2011  mild chronic gastritis  . Colonoscopy  06/2011    mult polyps adenomatous, severe diverticulosis, rpt 3 yrs [name])  . Carotid endarterectomy    . Cardiac catheterization  2004    normal; [facility]  . Cardiovascular stress test  02/2014    WNL, low risk scan, EF 68%    There were no vitals filed for this visit.  Visit Diagnosis:  Hemiplegia affecting right side in right-dominant patient as late effect of cerebrovascular disease  Muscle weakness  Lack of coordination due to stroke      Subjective Assessment - 03/16/15 0849    Subjective  I just had my Pt eval and it was great. I think it will really help.   Pertinent History see epic snapshot;  pt with H/o of CVA   Patient Stated Goals I have made alot of progress but would like to be able to do even more.    Currently in Pain? No/denies                      OT Treatments/Exercises (OP) - 03/16/15 0001    Fine Motor Coordination   In Hand Manipulation Training Pt able to complete entire 9 hole peg test in 2.00.19 seconds (baseline was 4 pegs in within 2 minutes). Will upgrade LTG # 3 based on today's progress. Attempted to both printing and writing of first name with brown foam on pen - pt with micrographia.  Practiced pre writing skills with marker - as pt works tone increases in hand and arm. Pt instructed to monitor tone and to use tone reduction techniques intermittently during prewriting activities at home.  Pt able to return demonstate and given pre writing activities to work on at home.    Neurological Re-education Exercises   Other Exercises 1 Neuro re ed to address low to mid functional reach. Pt able to lift lift object off low shelf without assistance and mid shelf with vc's and mod compensations.  Pt with greater ease in mid level functional reach with min facilitation.                 OT Education - 03/16/15 [phone]    Education provided Yes   Education Details pre writing activities   Person(s) Educated Patient   Methods Explanation;Demonstration;Verbal cues;Handout   Comprehension Verbalized understanding;Returned demonstration          OT Short Term Goals - 03/16/15 0942    OT SHORT TERM GOAL #1   Title Pt will be mod I with HEP - 03/16/2015   Status Achieved   OT SHORT TERM GOAL #2   Title Pt will demonstrate ability to complete mid level reach to pick up light object off shelf   Status Partially Met  with mod compensations   OT SHORT TERM GOAL #3   Title Pt will report no greater than 2/10 pain in R shoulder with shoulder flexion to 130* and abduction to 100* in supine.   Status Achieved  140* shoulder flexion/105* abduction with no pain   OT SHORT TERM GOAL #4   Title Pt will demonstrate understanding of strategies to compensate for tying and  zipping with AE prn   Status Achieved           OT Long Term Goals - 03/16/15 0943    OT LONG TERM GOAL #1   Title Pt will be mod I with upgraded HEP prn - 04/13/2015   Status On-going   OT LONG TERM  GOAL #2   Title Pt will demonstrate ability for 90* of shoulder flexion with reach to obtain light object off shelf with min compensations   Status Revised  03/16/2015   OT LONG TERM GOAL #3   Title Pt will demonstrate improved coordination as evidenced by completing 9 hole peg in no more than 1.50 seconds  (baseline= 4 pegs)   Status Revised  pt met this goal on 03/16/2015 therefore goal revised   OT LONG TERM GOAL #4   Title Pt will be able to write name with AE prn with R hand legibily   Status On-going   OT LONG TERM GOAL #5   Title Pt will demonstrate ability to use RUE as non dominant for basic ADL' tasks (baseline= gross assist)   Status On-going   OT LONG TERM GOAL #6   Title Pt will be able to use RUE in cooking task as non dominant for at least 50% of the task.    Status On-going               Plan - 03/16/15 0945    Clinical Impression Statement Pt making good progress toward goals (see updated STG"s and LTG's).  At end of session today pt stated he was felt nauseated and then vomited.  Pt sat and rested and had some diet Coke prior to leaving the clinic and stated he felt much better.  Pt stated "i think my breakfast and my pills just didn't sit right with me this morning"   Pt will benefit from skilled therapeutic intervention in order to improve on the following deficits (Retired) Decreased balance;Decreased coordination;Decreased mobility;Decreased range of motion;Decreased strength;Impaired UE functional use;Impaired tone;Impaired flexibility;Pain;Difficulty walking   Rehab Potential Good   Clinical Impairments Affecting Rehab Potential Pt will benefit from skilled OT to address the above deficits.   OT Frequency 2x / week   OT Duration 8 weeks   OT  Treatment/Interventions Self-care/ADL training;Moist Heat;DME and/or AE instruction;Neuromuscular education;Therapeutic exercise;Functional Mobility Training;Manual Therapy;Passive range of motion;Therapeutic activities;Balance training;Patient/family education   Plan continue neuor re ed for mid level reach and assisted reach activities.  proximal stable mobility, improved hand function for functional tasks   Consulted and Agree with Plan of Care Patient        Problem List Patient Active Problem List   Diagnosis Date Noted  . Spastic hemiparesis affecting dominant side 07/07/2014  . Right leg swelling 06/09/2014  . Left shoulder pain 03/16/2014  . Chest pressure 02/03/2014  . Spastic hemiplegia affecting dominant side 11/07/2013  . Medicare annual wellness visit, subsequent 11/06/2013  . Erectile dysfunction due to arterial insufficiency 02/05/2013  . Depression   . Palpitations 06/26/2012  . HLD (hyperlipidemia)   . Restless leg syndrome   . GERD (gastroesophageal reflux disease)   . BPH with obstruction/lower urinary tract symptoms   . CVA (cerebrovascular accident) 03/11/2012  . Right hemiparesis 03/11/2012  . Type 2 diabetes, controlled, with neuropathy 03/11/2012  . OSA (obstructive sleep apnea) 03/11/2012  . CKD (chronic kidney disease) stage 3, GFR 30-59 ml/min 03/11/2012  . Migraines 03/11/2012    Quay Burow, OTR/L 03/16/2015, 9:50 AM  [facility] [address] [place], [name], [address] Phone: [phone]   Fax:  [phone]

## 2015-03-16 NOTE — Patient Instructions (Signed)
Perform in a corner with a chair in front of you for safey:  Feet Apart, Head Motion - Eyes Closed   With eyes closed and feet shoulder width apart, move head slowly, up and down and side to side for 30 seconds. Repeat __3__ times per session. Do __1__ sessions per day.  Copyright  VHI. All rights reserved.  Feet Heel-Toe "Tandem", Varied Arm Positions - Eyes Open   With eyes open, right foot directly in front of the other, arms at your side, look straight ahead at a stationary object. Hold _15-30___ seconds. Repeat with other leg in front. Repeat __3__ times per session. Do __1__ sessions per day.  Copyright  VHI. All rights reserved.  Single Leg - Eyes Open   Holding support, lift right leg while maintaining balance over other leg. Progress to removing hands from support surface for longer periods of time. Hold_10-30___ seconds. Repeat with other leg. Repeat __3__ times per session. Do __12__ sessions per day.  Copyright  VHI. All rights reserved.  Weight Shift: Anterior / Posterior (Limits of Stability)   Slowly shift weight backward until toes begin to rise off floor. Return to starting position. Shift weight slowly forward until heels begin to rise off floor. Hold each position __2__ seconds. Repeat _20___ times per session. Do __1__ sessions per day.   Copyright  VHI. All rights reserved.  Marching in Place: Varied Surfaces   March in place, slowly lifting knees toward ceiling, 10 times per leg. Repeat __1__ times per session. Do __1__ sessions per day.   Copyright  VHI. All rights reserved.    Functional Quadriceps: Sit to Stand   Sit on edge of chair(bench), feet flat on floor. Stand upright, extending knees fully. Repeat _10___ times per set. Do __1__ sets per session. Do __1-2__ sessions per day.  http://orth.exer.us/734   Copyright  VHI. All rights reserved.   Abduction: Clam (Eccentric) - Side-Lying   Lie on side with knees bent, make sure your  hips and trunk stay forward. Place pillow behind back to stay forward if needed. Lift right knee, keeping feet together. Keep trunk steady. Slowly lower. Perform _10__ reps per set, _3__ sets per day, _3-4__ days per week. Repeat with other leg. Copyright  VHI. All rights reserved.

## 2015-03-18 ENCOUNTER — Inpatient Hospital Stay (HOSPITAL_COMMUNITY): Payer: Medicare Other

## 2015-03-18 ENCOUNTER — Inpatient Hospital Stay (HOSPITAL_COMMUNITY)
Admission: EM | Admit: 2015-03-18 | Discharge: 2015-03-19 | DRG: 057 | Disposition: A | Discharge status: Home / Self Care | Payer: Medicare Other | Attending: Internal Medicine | Admitting: Internal Medicine

## 2015-03-18 ENCOUNTER — Encounter (HOSPITAL_COMMUNITY): Payer: Self-pay | Admitting: Emergency Medicine

## 2015-03-18 ENCOUNTER — Ambulatory Visit: Payer: Medicare Other | Admitting: Occupational Therapy

## 2015-03-18 ENCOUNTER — Emergency Department (HOSPITAL_COMMUNITY): Payer: Medicare Other

## 2015-03-18 DIAGNOSIS — N183 Chronic kidney disease, stage 3 unspecified: Secondary | ICD-10-CM | POA: Diagnosis present

## 2015-03-18 DIAGNOSIS — Z794 Long term (current) use of insulin: Secondary | ICD-10-CM | POA: Diagnosis not present

## 2015-03-18 DIAGNOSIS — M6289 Other specified disorders of muscle: Secondary | ICD-10-CM

## 2015-03-18 DIAGNOSIS — R531 Weakness: Secondary | ICD-10-CM | POA: Diagnosis not present

## 2015-03-18 DIAGNOSIS — I639 Cerebral infarction, unspecified: Secondary | ICD-10-CM

## 2015-03-18 DIAGNOSIS — I129 Hypertensive chronic kidney disease with stage 1 through stage 4 chronic kidney disease, or unspecified chronic kidney disease: Secondary | ICD-10-CM | POA: Diagnosis present

## 2015-03-18 DIAGNOSIS — G459 Transient cerebral ischemic attack, unspecified: Secondary | ICD-10-CM

## 2015-03-18 DIAGNOSIS — Z9842 Cataract extraction status, left eye: Secondary | ICD-10-CM | POA: Diagnosis not present

## 2015-03-18 DIAGNOSIS — I6789 Other cerebrovascular disease: Secondary | ICD-10-CM | POA: Diagnosis not present

## 2015-03-18 DIAGNOSIS — E1140 Type 2 diabetes mellitus with diabetic neuropathy, unspecified: Secondary | ICD-10-CM | POA: Diagnosis present

## 2015-03-18 DIAGNOSIS — Z888 Allergy status to other drugs, medicaments and biological substances status: Secondary | ICD-10-CM | POA: Diagnosis not present

## 2015-03-18 DIAGNOSIS — R4781 Slurred speech: Secondary | ICD-10-CM | POA: Diagnosis present

## 2015-03-18 DIAGNOSIS — Z8673 Personal history of transient ischemic attack (TIA), and cerebral infarction without residual deficits: Secondary | ICD-10-CM

## 2015-03-18 DIAGNOSIS — Z7902 Long term (current) use of antithrombotics/antiplatelets: Secondary | ICD-10-CM

## 2015-03-18 DIAGNOSIS — Z961 Presence of intraocular lens: Secondary | ICD-10-CM | POA: Diagnosis present

## 2015-03-18 DIAGNOSIS — Z9841 Cataract extraction status, right eye: Secondary | ICD-10-CM | POA: Diagnosis not present

## 2015-03-18 DIAGNOSIS — F329 Major depressive disorder, single episode, unspecified: Secondary | ICD-10-CM | POA: Diagnosis present

## 2015-03-18 DIAGNOSIS — I69351 Hemiplegia and hemiparesis following cerebral infarction affecting right dominant side: Principal | ICD-10-CM

## 2015-03-18 DIAGNOSIS — I5032 Chronic diastolic (congestive) heart failure: Secondary | ICD-10-CM | POA: Diagnosis present

## 2015-03-18 DIAGNOSIS — G4733 Obstructive sleep apnea (adult) (pediatric): Secondary | ICD-10-CM | POA: Diagnosis present

## 2015-03-18 DIAGNOSIS — R479 Unspecified speech disturbances: Secondary | ICD-10-CM | POA: Diagnosis not present

## 2015-03-18 DIAGNOSIS — N138 Other obstructive and reflux uropathy: Secondary | ICD-10-CM | POA: Diagnosis present

## 2015-03-18 DIAGNOSIS — K219 Gastro-esophageal reflux disease without esophagitis: Secondary | ICD-10-CM | POA: Diagnosis present

## 2015-03-18 DIAGNOSIS — G2581 Restless legs syndrome: Secondary | ICD-10-CM | POA: Diagnosis present

## 2015-03-18 DIAGNOSIS — R29810 Facial weakness: Secondary | ICD-10-CM | POA: Diagnosis not present

## 2015-03-18 DIAGNOSIS — N401 Enlarged prostate with lower urinary tract symptoms: Secondary | ICD-10-CM

## 2015-03-18 DIAGNOSIS — E1165 Type 2 diabetes mellitus with hyperglycemia: Secondary | ICD-10-CM | POA: Diagnosis present

## 2015-03-18 DIAGNOSIS — I5189 Other ill-defined heart diseases: Secondary | ICD-10-CM | POA: Diagnosis present

## 2015-03-18 DIAGNOSIS — E785 Hyperlipidemia, unspecified: Secondary | ICD-10-CM | POA: Diagnosis present

## 2015-03-18 DIAGNOSIS — I10 Essential (primary) hypertension: Secondary | ICD-10-CM | POA: Diagnosis present

## 2015-03-18 DIAGNOSIS — G9389 Other specified disorders of brain: Secondary | ICD-10-CM | POA: Diagnosis not present

## 2015-03-18 DIAGNOSIS — I6529 Occlusion and stenosis of unspecified carotid artery: Secondary | ICD-10-CM | POA: Diagnosis present

## 2015-03-18 DIAGNOSIS — Z9989 Dependence on other enabling machines and devices: Secondary | ICD-10-CM

## 2015-03-18 DIAGNOSIS — E1122 Type 2 diabetes mellitus with diabetic chronic kidney disease: Secondary | ICD-10-CM | POA: Diagnosis present

## 2015-03-18 DIAGNOSIS — N1831 Chronic kidney disease, stage 3a: Secondary | ICD-10-CM | POA: Diagnosis present

## 2015-03-18 DIAGNOSIS — E1169 Type 2 diabetes mellitus with other specified complication: Secondary | ICD-10-CM | POA: Diagnosis present

## 2015-03-18 DIAGNOSIS — G8110 Spastic hemiplegia affecting unspecified side: Secondary | ICD-10-CM | POA: Diagnosis present

## 2015-03-18 LAB — COMPREHENSIVE METABOLIC PANEL
ALT: 14 U/L — ABNORMAL LOW (ref 17–63)
AST: 18 U/L (ref 15–41)
Albumin: 3.1 g/dL — ABNORMAL LOW (ref 3.5–5.0)
Alkaline Phosphatase: 38 U/L (ref 38–126)
Anion gap: 7 (ref 5–15)
BUN: 15 mg/dL (ref 6–20)
CO2: 28 mmol/L (ref 22–32)
Calcium: 8.9 mg/dL (ref 8.9–10.3)
Chloride: 104 mmol/L (ref 101–111)
Creatinine, Ser: 1.22 mg/dL (ref 0.61–1.24)
GFR calc Af Amer: >60 mL/min (ref >60)
GFR calc non Af Amer: 54 mL/min — ABNORMAL LOW (ref >60)
Glucose, Bld: 161 mg/dL — ABNORMAL HIGH (ref 65–99)
Potassium: 3.9 mmol/L (ref 3.5–5.1)
Sodium: 139 mmol/L (ref 135–145)
Total Bilirubin: 0.4 mg/dL (ref 0.3–1.2)
Total Protein: 6.8 g/dL (ref 6.5–8.1)

## 2015-03-18 LAB — CBC
HCT: 36.7 % — ABNORMAL LOW (ref 39.0–52.0)
Hemoglobin: 12.1 g/dL — ABNORMAL LOW (ref 13.0–17.0)
MCH: 28.2 pg (ref 26.0–34.0)
MCHC: 33 g/dL (ref 30.0–36.0)
MCV: 85.5 fL (ref 78.0–100.0)
Platelets: 192 10*3/uL (ref 150–400)
RBC: 4.29 MIL/uL (ref 4.22–5.81)
RDW: 14.5 % (ref 11.5–15.5)
WBC: 7.9 10*3/uL (ref 4.0–10.5)

## 2015-03-18 LAB — GLUCOSE, CAPILLARY
Glucose-Capillary: 144 mg/dL — ABNORMAL HIGH (ref 65–99)
Glucose-Capillary: 231 mg/dL — ABNORMAL HIGH (ref 65–99)

## 2015-03-18 LAB — URINALYSIS, ROUTINE W REFLEX MICROSCOPIC
Bilirubin Urine: NEGATIVE
Glucose, UA: 100 mg/dL — AB
Hgb urine dipstick: NEGATIVE
Ketones, ur: NEGATIVE mg/dL
Leukocytes, UA: NEGATIVE
Nitrite: NEGATIVE
Protein, ur: NEGATIVE mg/dL
Specific Gravity, Urine: 1.005 (ref 1.005–1.030)
Urobilinogen, UA: 0.2 mg/dL (ref 0.0–1.0)
pH: 6.5 (ref 5.0–8.0)

## 2015-03-18 LAB — CBG MONITORING, ED: Glucose-Capillary: 120 mg/dL — ABNORMAL HIGH (ref 65–99)

## 2015-03-18 LAB — PROTIME-INR
INR: 1.2 (ref 0.00–1.49)
Prothrombin Time: 15.4 seconds — ABNORMAL HIGH (ref 11.6–15.2)

## 2015-03-18 LAB — I-STAT TROPONIN, ED: Troponin i, poc: 0 ng/mL (ref 0.00–0.08)

## 2015-03-18 MED ORDER — PANTOPRAZOLE SODIUM 40 MG PO TBEC
40.0000 mg | DELAYED_RELEASE_TABLET | Freq: Every day | ORAL | Status: DC
Start: 2015-03-18 — End: 2015-03-19
  Administered 2015-03-18 – 2015-03-19 (×2): 40 mg via ORAL
  Filled 2015-03-18 (×2): qty 1

## 2015-03-18 MED ORDER — DIPHENHYDRAMINE HCL 50 MG/ML IJ SOLN
12.5000 mg | Freq: Once | INTRAMUSCULAR | Status: AC
  Administered 2015-03-18: 12.5 mg via INTRAVENOUS
  Filled 2015-03-18: qty 1

## 2015-03-18 MED ORDER — STROKE: EARLY STAGES OF RECOVERY BOOK
Freq: Once | Status: AC
  Administered 2015-03-18: 18:00:00

## 2015-03-18 MED ORDER — PRAMIPEXOLE DIHYDROCHLORIDE 0.125 MG PO TABS
0.2500 mg | ORAL_TABLET | Freq: Three times a day (TID) | ORAL | Status: DC
  Administered 2015-03-18 – 2015-03-19 (×3): 0.25 mg via ORAL
  Filled 2015-03-18 (×3): qty 2

## 2015-03-18 MED ORDER — METOCLOPRAMIDE HCL 5 MG/ML IJ SOLN
5.0000 mg | Freq: Once | INTRAMUSCULAR | Status: AC
  Administered 2015-03-18: 5 mg via INTRAVENOUS
  Filled 2015-03-18: qty 2

## 2015-03-18 MED ORDER — OMEGA-3-ACID ETHYL ESTERS 1 G PO CAPS
1.0000 g | ORAL_CAPSULE | Freq: Two times a day (BID) | ORAL | Status: DC
  Administered 2015-03-18 – 2015-03-19 (×2): 1 g via ORAL
  Filled 2015-03-18 (×2): qty 1

## 2015-03-18 MED ORDER — TRAZODONE HCL 50 MG PO TABS: 25.0000 mg | ORAL_TABLET | Freq: Every evening | ORAL | Status: DC | PRN

## 2015-03-18 MED ORDER — INSULIN GLARGINE 100 UNIT/ML ~~LOC~~ SOLN
50.0000 [IU] | Freq: Every day | SUBCUTANEOUS | Status: DC
  Administered 2015-03-18: 50 [IU] via SUBCUTANEOUS
  Filled 2015-03-18 (×2): qty 0.5

## 2015-03-18 MED ORDER — SENNOSIDES-DOCUSATE SODIUM 8.6-50 MG PO TABS: 1.0000 | ORAL_TABLET | Freq: Every evening | ORAL | Status: DC | PRN

## 2015-03-18 MED ORDER — LOSARTAN POTASSIUM 50 MG PO TABS
25.0000 mg | ORAL_TABLET | Freq: Every day | ORAL | Status: DC
  Administered 2015-03-19: 25 mg via ORAL
  Filled 2015-03-18 (×2): qty 1

## 2015-03-18 MED ORDER — PRAMIPEXOLE DIHYDROCHLORIDE 1 MG PO TABS
2.0000 mg | ORAL_TABLET | ORAL | Status: AC
  Administered 2015-03-18: 2 mg via ORAL
  Filled 2015-03-18: qty 2

## 2015-03-18 MED ORDER — CLOPIDOGREL BISULFATE 75 MG PO TABS
75.0000 mg | ORAL_TABLET | Freq: Every day | ORAL | Status: DC
  Administered 2015-03-19: 75 mg via ORAL
  Filled 2015-03-18: qty 1

## 2015-03-18 MED ORDER — DULOXETINE HCL 60 MG PO CPEP
60.0000 mg | ORAL_CAPSULE | Freq: Every day | ORAL | Status: DC
Start: 2015-03-19 — End: 2015-03-19
  Administered 2015-03-19: 60 mg via ORAL
  Filled 2015-03-18: qty 1

## 2015-03-18 MED ORDER — METOPROLOL TARTRATE 25 MG PO TABS
25.0000 mg | ORAL_TABLET | Freq: Two times a day (BID) | ORAL | Status: DC
  Administered 2015-03-18 – 2015-03-19 (×2): 25 mg via ORAL
  Filled 2015-03-18 (×2): qty 1

## 2015-03-18 MED ORDER — DIPHENHYDRAMINE HCL 50 MG/ML IJ SOLN
25.0000 mg | Freq: Once | INTRAMUSCULAR | Status: AC
  Administered 2015-03-18: 25 mg via INTRAVENOUS
  Filled 2015-03-18: qty 1

## 2015-03-18 MED ORDER — ATORVASTATIN CALCIUM 40 MG PO TABS
40.0000 mg | ORAL_TABLET | Freq: Every day | ORAL | Status: DC
  Administered 2015-03-19: 40 mg via ORAL
  Filled 2015-03-18: qty 1

## 2015-03-18 MED ORDER — INSULIN ASPART 100 UNIT/ML ~~LOC~~ SOLN
0.0000 [IU] | Freq: Three times a day (TID) | SUBCUTANEOUS | Status: DC
  Administered 2015-03-18 – 2015-03-19 (×2): 1 [IU] via SUBCUTANEOUS

## 2015-03-18 MED ORDER — INSULIN ASPART 100 UNIT/ML ~~LOC~~ SOLN
0.0000 [IU] | Freq: Every day | SUBCUTANEOUS | Status: DC
  Administered 2015-03-18: 2 [IU] via SUBCUTANEOUS

## 2015-03-18 MED ORDER — SODIUM CHLORIDE 0.9 % IV BOLUS (SEPSIS)
500.0000 mL | Freq: Once | INTRAVENOUS | Status: AC
  Administered 2015-03-18: 500 mL via INTRAVENOUS

## 2015-03-18 MED ORDER — ENOXAPARIN SODIUM 40 MG/0.4ML ~~LOC~~ SOLN
40.0000 mg | SUBCUTANEOUS | Status: DC
  Administered 2015-03-18: 40 mg via SUBCUTANEOUS
  Filled 2015-03-18: qty 0.4

## 2015-03-18 NOTE — ED Notes (Signed)
Pt in CT.

## 2015-03-18 NOTE — Consult Note (Signed)
Referring Physician: Aline Brochure    Chief Complaint: TIA  HPI:                                                                                                                                         James Bell is an 81 y.o. male with history of CVA and on Plavix.  Previous stroke involved the right side and he has been going to therapy for right sided spasticity and taking Botox for right arm spasticity. Yesterday he went out to dinner with friends and noted he was not thinking as well and felt his right side was weaker. HE went to sleep and noted when he awoke his right side was weaker and had increased spasticity. His daughter feels his speech is also more slurred.   Date last known well: Date: 03/17/2015 Time last known well: Time: 17:00 tPA Given: No: outside of window     Past Medical History  Diagnosis Date  . HLD (hyperlipidemia)   . Restless leg syndrome     well controlled on mirapex  . OSA on CPAP     sleep study 01/2014 - severe, CPAP at 10, previously on nuvigil (Dr. [name])  . Type 2 diabetes, controlled, with neuropathy 2011  . H/O hiatal hernia   . GERD (gastroesophageal reflux disease)     with sliding HH  . Migraines 03/11/12    now rare  . CVA (cerebral infarction) 03/07/12    slurred speech; right hemplegia, left handed - completed PT 07/2014 (17 sessions)  . Basal cell carcinoma of nose 2002    bridge of nose  . History of kidney problems 12/2010    lacerated left kidney after fall  . BPH with obstruction/lower urinary tract symptoms     failed flomax, uroxatral, myrbetriq - sees urology [name]) pending videourodynamics  . Depression     mild, on cymbalta per prior PCP  . Palpitations 01/2012    holter WNL, rare PAC/PVCs  . Erectile dysfunction     per urology (prostaglandin injection)  . Kidney cysts 02/2014    bilateral (renal US)  . CKD (chronic kidney disease) stage 3, GFR 30-59 ml/min 03/11/2012    Renal US 02/2014 - multiple kidney cysts    . TIA (transient ischemic attack)   . Anemia     Past Surgical History  Procedure Laterality Date  . Prostate surgery  02/2009    PVP (laser)  . Tonsillectomy and adenoidectomy  ~ 1945  . Hemorrhoid surgery  1991  . Cataract extraction w/ intraocular lens implant  11//2012 - 08/2011  . Inguinal hernia repair  1981; 2002    left, then repaired  . Skin cancer excision  2002    bridge of nose, [facility]  . Colonoscopy  05/2011    normal per pt (Dr. [name], [place], [name])  . Mri brain  04/2010    WNL  .  Esophagogastroduodenoscopy  2006    duodenitis, medual HH, Grade 2 reflux, mild gastritis  . Colonoscopy  03/2011    poor prep, unable to biopsy polyp in tic fold, rpt 3 mo [name])  . Abi  2007    WNL  . Urethral dilation  2014    tannenbaum  . Cystoscopy with urethral dilatation N/A 07/24/2013    Procedure: CYSTOSCOPY WITH URETHRAL DILATATION;  Surgeon: [name], MD;  Location: [facility];  Service: Urology;  Laterality: N/A;  . Transurethral incision of prostate N/A 07/24/2013    Procedure: TRANSURETHRAL INCISION OF THE PROSTATE (TUIP);  Surgeon: [name], MD;  Location: [facility];  Service: Urology;  Laterality: N/A;  . Esophagogastroduodenoscopy  07/2011    mild chronic gastritis  . Colonoscopy  06/2011    mult polyps adenomatous, severe diverticulosis, rpt 3 yrs [name])  . Carotid endarterectomy    . Cardiac catheterization  2004    normal; [facility]  . Cardiovascular stress test  02/2014    WNL, low risk scan, EF 68%    Family History  Problem Relation Age of Onset  . Stroke Father   . Lung cancer Father 61     smoker  . Stroke Brother   . Diabetes Brother 77  . Coronary artery disease Neg Hx   . Lung cancer Brother   . Alcoholism Sister   . Kidney disease Sister    Social History:  reports that he has never smoked. He has never used smokeless tobacco. He reports that he drinks about 2.4 oz of alcohol per week. He reports that he does not use illicit  drugs.  Allergies:  Allergies  Allergen Reactions  . Lisinopril Cough    tachycardia  . Nexium [Esomeprazole] Other (See Comments)    Headache  . Niacin And Related Other (See Comments)    flushing    Medications:                                                                                                                           No current facility-administered medications for this encounter.   Current Outpatient Prescriptions  Medication Sig Dispense Refill  . atorvastatin (LIPITOR) 40 MG tablet TAKE 1 TABLET DAILY 90 tablet 1  . clopidogrel (PLAVIX) 75 MG tablet TAKE 1 TABLET DAILY (NEED OFFICE VISIT. PATIENT MUST MAKE AN APPOINTMENT FOR ANY FURTHER REFILLS [phone]) 30 tablet 0  . Coenzyme Q10 (COQ10) 150 MG CAPS Take 1 capsule by mouth 2 (two) times daily.    . diazepam (VALIUM) 5 MG tablet Take 1 tablet (5 mg total) by mouth every 8 (eight) hours as needed for anxiety. 30 tablet 3  . DULoxetine (CYMBALTA) 60 MG capsule TAKE 1 CAPSULE DAILY 90 capsule 2  . Folic Acid-Vit W2-NFA O13 (FOLBEE) 2.5-25-1 MG TABS tablet Take 1 tablet by mouth daily.    Marland Kitchen ibuprofen (ADVIL,MOTRIN) 200 MG tablet Take 400 mg by mouth every 6 (six) hours as  needed for mild pain or moderate pain.    . Insulin Glargine (LANTUS SOLOSTAR) 100 UNIT/ML Solostar Pen Inject 60 Units into the skin at bedtime. (Patient taking differently: Inject 50 Units into the skin at bedtime. ) 3 pen 2  . losartan (COZAAR) 25 MG tablet Take 1 tablet (25 mg total) by mouth daily. 90 tablet 3  . metoprolol tartrate (LOPRESSOR) 25 MG tablet TAKE 1 TABLET TWICE A DAY 180 tablet 1  . NITROSTAT 0.4 MG SL tablet DISSOLVE 1 TABLET UNDER THE TONGUE EVERY 5 MINUTES AS NEEDED FOR CHEST PAIN 30 tablet 0  . NOVOFINE 30G X 8 MM MISC USE ONE DAILY AS DIRECTED 100 each 2  . omega-3 acid ethyl esters (LOVAZA) 1 G capsule TAKE 2 CAPSULES (2 GRAMS TOTAL) TWICE A DAY 360 capsule 1  . omeprazole (PRILOSEC) 40 MG capsule TAKE 1 CAPSULE TWICE A  DAY AS NEEDED 180 capsule 1  . pramipexole (MIRAPEX) 1 MG tablet TAKE 1 TABLET AT BEDTIME (NEED OFFICE VISIT. PATIENT MUST MAKE AN APPOINTMENT FOR ANY FURTHER REFILLS [phone]) 30 tablet 0  . sitaGLIPtin-metformin (JANUMET) 50-1000 MG per tablet Take 1 tablet by mouth 2 (two) times daily with a meal.    . traZODone (DESYREL) 50 MG tablet Take 25 mg by mouth at bedtime as needed.    . vitamin C (ASCORBIC ACID) 500 MG tablet Take 1,000 mg by mouth daily.        ROS:                                                                                                                                       History obtained from the patient  General ROS: negative for - chills, fatigue, fever, night sweats, weight gain or weight loss Psychological ROS: negative for - behavioral disorder, hallucinations, memory difficulties, mood swings or suicidal ideation Ophthalmic ROS: negative for - blurry vision, double vision, eye pain or loss of vision ENT ROS: negative for - epistaxis, nasal discharge, oral lesions, sore throat, tinnitus or vertigo Allergy and Immunology ROS: negative for - hives or itchy/watery eyes Hematological and Lymphatic ROS: negative for - bleeding problems, bruising or swollen lymph nodes Endocrine ROS: negative for - galactorrhea, hair pattern changes, polydipsia/polyuria or temperature intolerance Respiratory ROS: negative for - cough, hemoptysis, shortness of breath or wheezing Cardiovascular ROS: negative for - chest pain, dyspnea on exertion, edema or irregular heartbeat Gastrointestinal ROS: negative for - abdominal pain, diarrhea, hematemesis, nausea/vomiting or stool incontinence Genito-Urinary ROS: negative for - dysuria, hematuria, incontinence or urinary frequency/urgency Musculoskeletal ROS: negative for - joint swelling or muscular weakness Neurological ROS: as noted in HPI Dermatological ROS: negative for rash and skin lesion changes  Neurologic Examination:  Blood pressure 122/56, pulse 83, temperature 98.3 F (36.8 C), temperature source Oral, resp. rate 10, height 5\' 8"  (1.727 m), weight 81.647 kg (180 lb), SpO2 98 %.  HEENT-  Normocephalic, no lesions, without obvious abnormality.  Normal external eye and conjunctiva.  Normal TM's bilaterally.  Normal auditory canals and external ears. Normal external nose, mucus membranes and septum.  Normal pharynx. Cardiovascular- S1, S2 normal, pulses palpable throughout   Lungs- chest clear, no wheezing, rales, normal symmetric air entry Abdomen- normal findings: bowel sounds normal Extremities- less then 2 second capillary refill Lymph-no adenopathy palpable Musculoskeletal-no joint tenderness, deformity or swelling Skin-warm and dry, no hyperpigmentation, vitiligo, or suspicious lesions  Neurological Examination Mental Status: Alert, oriented, thought content appropriate.  Speech dysarthric without evidence of aphasia.  Able to follow 3 step commands without difficulty. Cranial Nerves: II: Discs flat bilaterally; Visual fields grossly normal, pupils equal, round, reactive to light and accommodation III,IV, VI: ptosis not present, extra-ocular motions intact bilaterally V,VII: smile asymmetric on the right, facial light touch sensation normal bilaterally VIII: hearing normal bilaterally IX,X: uvula rises symmetrically XI: bilateral shoulder shrug XII: midline tongue extension Motor: Right : Upper extremity   4/5    Left:     Upper extremity   5/5  Lower extremity   4/5     Lower extremity   5/5 Tone and bulk:normal tone throughout; no atrophy noted Sensory: Pinprick and light touch intact throughout, bilaterally Deep Tendon Reflexes: 2+ and symmetric throughout Plantars: Right: upgoing   Left: downgoing Cerebellar: normal finger-to-nose, dysmetric right heel to shin Gait: not tested due to safety        Lab Results: Basic Metabolic Panel:  Recent Labs Lab 03/18/15 0921  NA 139  K 3.9  CL 104  CO2 28  GLUCOSE 161*  BUN 15  CREATININE 1.22  CALCIUM 8.9    Liver Function Tests:  Recent Labs Lab 03/18/15 0921  AST 18  ALT 14*  ALKPHOS 38  BILITOT 0.4  PROT 6.8  ALBUMIN 3.1*   No results for input(s): LIPASE, AMYLASE in the last 168 hours. No results for input(s): AMMONIA in the last 168 hours.  CBC:  Recent Labs Lab 03/18/15 0921  WBC 7.9  HGB 12.1*  HCT 36.7*  MCV 85.5  PLT 192    Cardiac Enzymes: No results for input(s): CKTOTAL, CKMB, CKMBINDEX, TROPONINI in the last 168 hours.  Lipid Panel: No results for input(s): CHOL, TRIG, HDL, CHOLHDL, VLDL, LDLCALC in the last 168 hours.  CBG: No results for input(s): GLUCAP in the last 168 hours.  Microbiology: Results for orders placed or performed in visit on 10/10/12  Fecal occult blood, imunochemical     Status: None   Collection Time: 10/10/12 12:17 PM  Result Value Ref Range Status   Fecal Occult Bld Negative Negative Final    Coagulation Studies:  Recent Labs  03/18/15 0921  LABPROT 15.4*  INR 1.20    Imaging: Ct Head Wo Contrast  03/18/2015   CLINICAL DATA:  81 year old male with a history of stroke symptoms  EXAM: CT HEAD WITHOUT CONTRAST  TECHNIQUE: Contiguous axial images were obtained from the base of the skull through the vertex without intravenous contrast.  COMPARISON:  02/23/2015, prior MR head 03/11/2012, CT 03/11/2012  FINDINGS: Unremarkable appearance of the calvarium without acute fracture or aggressive lesion.  Unremarkable appearance of the scalp soft tissues.  Unremarkable appearance of the bilateral orbits.  Bilateral lens extraction  Mastoid air cells are clear.  No significant paranasal sinus disease  No acute intracranial hemorrhage, midline shift, or mass effect.  Gray-white differentiation is maintained, without CT evidence of acute ischemia.  Unremarkable  configuration of the ventricles.  Re- demonstration of encephalomalacia secondary to prior infarction of the left posterior basal ganglia. Associated encephalomalacia changes of the left-sided centrum semi-ovale .  IMPRESSION: No CT evidence of acute intracranial abnormality.  Re- demonstration of encephalomalacia of the left posterior basal ganglia and centrum semi-ovale, status post remote infarction.  Signed,  Dulcy Fanny. Earleen Newport, DO  [facility]  [facility]   Electronically Signed   By: Corrie Mckusick D.O.   On: 03/18/2015 11:07       Assessment and plan discussed with with attending physician and they are in agreement.    Etta Quill PA-C [facility] [phone]  03/18/2015, 12:01 PM   Assessment: 81 y.o. male with previous stroke and residual right sided weakness and spasticity.  Presenting today with increased right sided weakness, right LE dysmetria and dysarthria. CT head showed no acute infarct or bleed. Given risk factors cannot rule out new left brain CVA.   Stroke Risk Factors - diabetes mellitus and hyperlipidemia  Recommend: 1. HgbA1c, fasting lipid panel 2. MRI, MRA  of the brain without contrast 3. PT consult, OT consult, Speech consult 4. Echocardiogram 5. Carotid dopplers 6. Prophylactic therapy-Antiplatelet med: Plavix - dose 75 mg daily 7. Risk factor modification 8. Telemetry monitoring 9. Frequent neuro checks 10 NPO until passes stroke swallow screen 11. EEG   [name] Like, DO [facility] [phone]  If 7pm- 7am, please page neurology on call as listed in [place].

## 2015-03-18 NOTE — ED Notes (Signed)
Pt has hx of stroke with minimal right deficits. Pt wears brace on right leg. Yesterday pt wasn't feeling well, went to bed at 8pm. Woke up at 11pm and felt like his right hand wasn't working right. Pt went back to bed and woke up with right hand contracted. Pt feels numb on right side with some slurred speech and minor facial droop on right side. CBG 170, BP 132/64, HR 74. 98% room air. Sinus rhythm

## 2015-03-18 NOTE — ED Notes (Signed)
Pt still in MRI 

## 2015-03-18 NOTE — ED Notes (Signed)
Meal Tray ordered Diabetic Carb Modified -spoke to [place].

## 2015-03-18 NOTE — ED Notes (Signed)
MD ok with pt having [place] sandwich and coke. Gave pt these and ordered meal tray

## 2015-03-18 NOTE — Progress Notes (Signed)
Pt arrived to 4N05. Alert and oriented x4. Placed on tele. Denies pain. Bed alarm and call bell within reach. Will continue to monitor.

## 2015-03-18 NOTE — Procedures (Signed)
History: 81 yo M with confusion, previous stroke.   Sedation: None  Technique: This is a 19 channel routine scalp EEG performed at the bedside with bipolar and monopolar montages arranged in accordance to the international 10/20 system of electrode placement. One channel was dedicated to EKG recording.    Background: The background consists of intermixed alpha and beta activities. There is a well defined posterior dominant rhythm of 8.5 Hz that attenuates with eye opening. Sleep is recorded with normal appearing structures.   Photic stimulation: Physiologic driving is not performed  EEG Abnormalities: None  Clinical Interpretation: This normal EEG is recorded in the waking and sleep state. There was no seizure or seizure predisposition recorded on this study.   Roland Rack, MD [facility] [phone]  If 7pm- 7am, please page neurology on call as listed in [place].

## 2015-03-18 NOTE — ED Notes (Signed)
Notified RN of CBG 120

## 2015-03-18 NOTE — H&P (Signed)
[facility] History and Physical                                                                                    James Bell, is a 81 y.o. male  MRN: [id number]   DOB - 16-Nov-1930  Admit Date - 03/18/2015  Outpatient Primary MD for the patient is [name], MD  Referring MD: Aline Brochure / ER  Consulting MD: Janann Colonel / Neurology  With History of -  Past Medical History  Diagnosis Date  . HLD (hyperlipidemia)   . Restless leg syndrome     well controlled on mirapex  . OSA on CPAP     sleep study 01/2014 - severe, CPAP at 10, previously on nuvigil (Dr. [name])  . Type 2 diabetes, controlled, with neuropathy 2011  . H/O hiatal hernia   . GERD (gastroesophageal reflux disease)     with sliding HH  . Migraines 03/11/12    now rare  . CVA (cerebral infarction) 03/07/12    slurred speech; right hemplegia, left handed - completed PT 07/2014 (17 sessions)  . Basal cell carcinoma of nose 2002    bridge of nose  . History of kidney problems 12/2010    lacerated left kidney after fall  . BPH with obstruction/lower urinary tract symptoms     failed flomax, uroxatral, myrbetriq - sees urology [name]) pending videourodynamics  . Depression     mild, on cymbalta per prior PCP  . Palpitations 01/2012    holter WNL, rare PAC/PVCs  . Erectile dysfunction     per urology (prostaglandin injection)  . Kidney cysts 02/2014    bilateral (renal US)  . CKD (chronic kidney disease) stage 3, GFR 30-59 ml/min 03/11/2012    Renal US 02/2014 - multiple kidney cysts   . TIA (transient ischemic attack)   . Anemia       Past Surgical History  Procedure Laterality Date  . Prostate surgery  02/2009    PVP (laser)  . Tonsillectomy and adenoidectomy  ~ 1945  . Hemorrhoid surgery  1991  . Cataract extraction w/ intraocular lens implant  11//2012 - 08/2011  . Inguinal hernia repair  1981; 2002    left, then repaired  . Skin cancer excision  2002    bridge of nose, [facility]  .  Colonoscopy  05/2011    normal per pt (Dr. [name], [place], [name])  . Mri brain  04/2010    WNL  . Esophagogastroduodenoscopy  2006    duodenitis, medual HH, Grade 2 reflux, mild gastritis  . Colonoscopy  03/2011    poor prep, unable to biopsy polyp in tic fold, rpt 3 mo [name])  . Abi  2007    WNL  . Urethral dilation  2014    tannenbaum  . Cystoscopy with urethral dilatation N/A 07/24/2013    Procedure: CYSTOSCOPY WITH URETHRAL DILATATION;  Surgeon: [name], MD;  Location: [facility];  Service: Urology;  Laterality: N/A;  . Transurethral incision of prostate N/A 07/24/2013    Procedure: TRANSURETHRAL INCISION OF THE PROSTATE (TUIP);  Surgeon: [name], MD;  Location: [facility];  Service: Urology;  Laterality:  N/A;  . Esophagogastroduodenoscopy  07/2011    mild chronic gastritis  . Colonoscopy  06/2011    mult polyps adenomatous, severe diverticulosis, rpt 3 yrs [name])  . Carotid endarterectomy    . Cardiac catheterization  2004    normal; [facility]  . Cardiovascular stress test  02/2014    WNL, low risk scan, EF 68%    in for   Chief Complaint  Patient presents with  . Stroke Symptoms     HPI This is a 81 year old [employer] with history of CVA in 2013 with resultant right-sided weakness and issues with chronic right upper extremity spasticity. He also has history of diabetes on insulin, chronic kidney disease, restless leg syndrome, sleep apnea, dyslipidemia, GERD and BPH. He presented to the hospital today complaining of worsening right-sided weakness from baseline as well as a generalized fogginess as well as gait instability. Patient reported that symptoms initially began around 5 PM yesterday on 6/29 while he was at dinner. He noticed that he wasn't as talkative as per baseline, his speech was a little thicker than usual and he had no appetite. He noticed it when he was walking his right leg seemed to be weaker than baseline and he felt uneasy  about walking due to gait disturbance. He did not notice any numbness or tingling on the right side. He reports that he has had improvement in his baseline right upper spasticity with Botox injections from Dr. Providence Lanius. He continued to have symptoms of generalized weakness and what he describes as "foggy" persisting until today. He is not had any trouble swallowing. He's not had recent infections. He does live alone but family has not noticed any tonic-clonic type seizure activity nor any staring episodes. Patient denies any difficulty formulating words or understanding the spoken or written word. Patient reports that he was seen in the ER about 2 weeks before for similar symptoms. At that time he had some mild renal insufficiency and it was surmised he may have been fine depleted and that was the etiology to his symptoms. CT of the head at that time was negative.  In the ER patient was afebrile, BP was 131/58, pulse was 61 respirations 14 he was saturating 99% on room air. Laboratory data unremarkable, troponin 0.00. PT 15.4 INR 1.2, glucose 161, urinalysis unremarkable except for mild glycosuria. CT of the head was unremarkable. EKG revealed sinus rhythm   Review of Systems   In addition to the HPI above,  No Fever-chills, myalgias or other constitutional symptoms No Headache, changes with Vision or hearing, new tingling or numbness in any extremity, No problems swallowing food or Liquids, indigestion/reflux No Chest pain, Cough or Shortness of Breath, palpitations, orthopnea or DOE No Abdominal pain, N/V; no melena or hematochezia, no dark tarry stools, Bowel movements are regular, No dysuria, hematuria or flank pain No new skin rashes, lesions, masses or bruises, No new joints pains-aches No recent weight gain or loss No polyuria, polydypsia or polyphagia,  *A full 10 point Review of Systems was done, except as stated above, all other Review of Systems were negative.  Social  History History  Substance Use Topics  . Smoking status: Never Smoker   . Smokeless tobacco: Never Used     Comment: "used to smoke pipe when I was younger"  . Alcohol Use: 2.4 oz/week    2 Glasses of wine, 2 Shots of liquor per week     Comment: occasional    Resides at: Private residence  Lives with: Alone  Ambulatory status: Typically without assistive devices   Family History Family History  Problem Relation Age of Onset  . Stroke Father   . Lung cancer Father 17     smoker  . Stroke Brother   . Diabetes Brother 27  . Coronary artery disease Neg Hx   . Lung cancer Brother   . Alcoholism Sister   . Kidney disease Sister      Prior to Admission medications   Medication Sig Start Date End Date Taking? Authorizing Provider  atorvastatin (LIPITOR) 40 MG tablet TAKE 1 TABLET DAILY 01/19/15  Yes [name], MD  clopidogrel (PLAVIX) 75 MG tablet TAKE 1 TABLET DAILY (NEED OFFICE VISIT. PATIENT MUST MAKE AN APPOINTMENT FOR ANY FURTHER REFILLS [phone]) 03/05/15  Yes [name], MD  Coenzyme Q10 (COQ10) 150 MG CAPS Take 1 capsule by mouth 2 (two) times daily.   Yes Historical Provider, MD  diazepam (VALIUM) 5 MG tablet Take 1 tablet (5 mg total) by mouth every 8 (eight) hours as needed for anxiety.   Yes [name], MD  DULoxetine (CYMBALTA) 60 MG capsule TAKE 1 CAPSULE DAILY 04/23/14  Yes [name], MD  Folic Acid-Vit G6-KZL D35 (FOLBEE) 2.5-25-1 MG TABS tablet Take 1 tablet by mouth daily. 10/04/12  Yes [name], MD  ibuprofen (ADVIL,MOTRIN) 200 MG tablet Take 400 mg by mouth every 6 (six) hours as needed for mild pain or moderate pain.   Yes Historical Provider, MD  Insulin Glargine (LANTUS SOLOSTAR) 100 UNIT/ML Solostar Pen Inject 60 Units into the skin at bedtime. Patient taking differently: Inject 50 Units into the skin at bedtime.  09/28/14  Yes [name], MD  losartan (COZAAR) 25 MG tablet Take 1 tablet (25 mg total) by mouth daily.    Yes [name], MD  metoprolol tartrate (LOPRESSOR) 25 MG tablet TAKE 1 TABLET TWICE A DAY 01/04/15  Yes [name], MD  NITROSTAT 0.4 MG SL tablet DISSOLVE 1 TABLET UNDER THE TONGUE EVERY 5 MINUTES AS NEEDED FOR CHEST PAIN 04/23/14  Yes [name], MD  NOVOFINE 30G X 8 MM MISC USE ONE DAILY AS DIRECTED 01/04/15  Yes [name], MD  omega-3 acid ethyl esters (LOVAZA) 1 G capsule TAKE 2 CAPSULES (2 GRAMS TOTAL) TWICE A DAY 12/15/14  Yes [name], MD  omeprazole (PRILOSEC) 40 MG capsule TAKE 1 CAPSULE TWICE A DAY AS NEEDED 02/22/15  Yes [name], MD  pramipexole (MIRAPEX) 1 MG tablet TAKE 1 TABLET AT BEDTIME (NEED OFFICE VISIT. PATIENT MUST MAKE AN APPOINTMENT FOR ANY FURTHER REFILLS [phone]) 03/05/15  Yes [name], MD  sitaGLIPtin-metformin (JANUMET) 50-1000 MG per tablet Take 1 tablet by mouth 2 (two) times daily with a meal.   Yes Historical Provider, MD  traZODone (DESYREL) 50 MG tablet Take 25 mg by mouth at bedtime as needed. 01/05/15  Yes Historical Provider, MD  vitamin C (ASCORBIC ACID) 500 MG tablet Take 1,000 mg by mouth daily.    Yes Historical Provider, MD    Allergies  Allergen Reactions  . Lisinopril Cough    tachycardia  . Nexium [Esomeprazole] Other (See Comments)    Headache  . Niacin And Related Other (See Comments)    flushing    Physical Exam  Vitals  Blood pressure 122/56, pulse 83, temperature 98.3 F (36.8 C), temperature source Oral, resp. rate 10, height 5\' 8"  (1.727 m), weight 180 lb (81.647 kg), SpO2 98 %.   General:  In no acute distress, appears healthy  and well nourished  Psych:  Normal affect, Denies Suicidal or Homicidal ideations, Awake Alert, Oriented X 3. Speech and thought patterns are clear and appropriate, no apparent short term memory deficits  Neuro: CN II through XII intact, Strength 4/5 right upper extremity otherwise strength 5 over 5 remaining extremities, Sensation intact all 4 extremities.  Patient noted with dysmetric activity involving heel-to-shin on the right lower extremity as well as an upgoing Babinski on the right lower extremity. Gait and ambulation were not assessed in the ER. Patient has stable and chronic right facial drooping although speech slightly slurred and more "thick" than baseline according to family at bedside  ENT:  Ears and Eyes appear Normal, Conjunctivae clear, PER. Moist oral mucosa without erythema or exudates.  Neck:  Supple, No lymphadenopathy appreciated  Respiratory:  Symmetrical chest wall movement, Good air movement bilaterally, CTAB. Room Air  Cardiac:  RRR, No Murmurs, no LE edema noted, no JVD, No carotid bruits, peripheral pulses palpable at 2+  Abdomen:  Positive bowel sounds, Soft, Non tender, Non distended,  No masses appreciated, no obvious hepatosplenomegaly  Skin:  No Cyanosis, Normal Skin Turgor, No Skin Rash or Bruise.  Extremities: Symmetrical without obvious trauma or injury,  no effusions.  Data Review  CBC  Recent Labs Lab 03/18/15 0921  WBC 7.9  HGB 12.1*  HCT 36.7*  PLT 192  MCV 85.5  MCH 28.2  MCHC 33.0  RDW 14.5    Chemistries   Recent Labs Lab 03/18/15 0921  NA 139  K 3.9  CL 104  CO2 28  GLUCOSE 161*  BUN 15  CREATININE 1.22  CALCIUM 8.9  AST 18  ALT 14*  ALKPHOS 38  BILITOT 0.4    estimated creatinine clearance is 45.9 mL/min (by C-G formula based on Cr of 1.22).  No results for input(s): TSH, T4TOTAL, T3FREE, THYROIDAB in the last 72 hours.  Invalid input(s): FREET3  Coagulation profile  Recent Labs Lab 03/18/15 0921  INR 1.20    No results for input(s): DDIMER in the last 72 hours.  Cardiac Enzymes No results for input(s): CKMB, TROPONINI, MYOGLOBIN in the last 168 hours.  Invalid input(s): CK  Invalid input(s): POCBNP  Urinalysis    Component Value Date/Time   COLORURINE YELLOW 03/18/2015 1102   APPEARANCEUR CLEAR 03/18/2015 1102   LABSPEC 1.005 03/18/2015 1102    PHURINE 6.5 03/18/2015 1102   GLUCOSEU 100* 03/18/2015 1102   HGBUR NEGATIVE 03/18/2015 1102   BILIRUBINUR NEGATIVE 03/18/2015 1102   BILIRUBINUR Negative 07/05/2012 1139   KETONESUR NEGATIVE 03/18/2015 1102   PROTEINUR NEGATIVE 03/18/2015 1102   PROTEINUR Negative 07/05/2012 1139   UROBILINOGEN 0.2 03/18/2015 1102   UROBILINOGEN 0.2 07/05/2012 1139   NITRITE NEGATIVE 03/18/2015 1102   NITRITE Negative 07/05/2012 1139   LEUKOCYTESUR NEGATIVE 03/18/2015 1102    Imaging results:   Dg Chest 2 View  02/23/2015   CLINICAL DATA:  Gait disturbance, history hyperlipidemia, type 2 diabetes, chronic kidney disease  EXAM: CHEST  2 VIEW  COMPARISON:  11/29/2012  FINDINGS: Normal heart size, mediastinal contours, and pulmonary vascularity.  Minimal chronic peribronchial thickening.  Lungs otherwise clear.  No pleural effusion or pneumothorax.  Scattered endplate spur formation thoracic spine.  IMPRESSION: No acute abnormalities.   Electronically Signed   By: Lavonia Dana M.D.   On: 02/23/2015 12:55   Ct Head Wo Contrast  03/18/2015   CLINICAL DATA:  81 year old male with a history of stroke symptoms  EXAM: CT HEAD WITHOUT CONTRAST  TECHNIQUE: Contiguous axial images were obtained from the base of the skull through the vertex without intravenous contrast.  COMPARISON:  02/23/2015, prior MR head 03/11/2012, CT 03/11/2012  FINDINGS: Unremarkable appearance of the calvarium without acute fracture or aggressive lesion.  Unremarkable appearance of the scalp soft tissues.  Unremarkable appearance of the bilateral orbits.  Bilateral lens extraction  Mastoid air cells are clear.  No significant paranasal sinus disease  No acute intracranial hemorrhage, midline shift, or mass effect.  Gray-white differentiation is maintained, without CT evidence of acute ischemia.  Unremarkable configuration of the ventricles.  Re- demonstration of encephalomalacia secondary to prior infarction of the left posterior basal ganglia.  Associated encephalomalacia changes of the left-sided centrum semi-ovale .  IMPRESSION: No CT evidence of acute intracranial abnormality.  Re- demonstration of encephalomalacia of the left posterior basal ganglia and centrum semi-ovale, status post remote infarction.  Signed,  Dulcy Fanny. Earleen Newport, DO  [facility]  [facility]   Electronically Signed   By: Corrie Mckusick D.O.   On: 03/18/2015 11:07   Ct Head Wo Contrast  02/23/2015   CLINICAL DATA:  Changing gait, staggering today, slurred speech, question confusion, history hyperlipidemia, type 2 diabetes, prior TIA and stroke  EXAM: CT HEAD WITHOUT CONTRAST  TECHNIQUE: Contiguous axial images were obtained from the base of the skull through the vertex without intravenous contrast.  COMPARISON:  03/11/2012  FINDINGS: Generalized atrophy.  Normal ventricular morphology.  No midline shift or mass effect.  Old LEFT basal ganglia lacunar infarct.  Small vessel chronic ischemic changes of deep cerebral white matter.  No intracranial hemorrhage, mass lesion, or evidence acute infarction.  No extra-axial fluid collections.  Visualized paranasal sinuses and mastoid air cells clear.  Bones unremarkable.  IMPRESSION: Atrophy with small vessel chronic ischemic changes of deep cerebral white matter.  Old LEFT basal ganglia lacunar infarct.  No acute intracranial abnormalities.   Electronically Signed   By: Lavonia Dana M.D.   On: 02/23/2015 13:00     EKG: (Independently reviewed) sinus rhythm, LAFB, no acute ST changes   Assessment & Plan  Principal Problem:   Acute right-sided weakness in setting of prior CVA with residual spastic hemiparesis -Admit to neuro telemetry -Appreciate neurology assistance -Current CT shows left-sided encephalomalacia is residual effect of prior stroke but definitely has new symptoms warranting additional evaluation -Stat MRI/MRA brain -Echocardiogram since last done 2013 -Carotid  duplex -Since having generalized fogginess concerned may be having atypical presentation for seizure activity so check EEG -PT/OT evaluations -Pass bedside swallowing evaluation solely on diet -Speech therapy evaluation -Lipid panel and hemoglobin A1c -Continue antiplatelets Plavix  Active Problems:   Type 2 diabetes, controlled, with neuropathy -Continue Lantus -Hold Januvamet -Check CBG before meals/at bedtime and provide SSI    CKD (chronic kidney disease) stage 3, GFR 30-59 ml/min -Renal function stable and at baseline (15/1.22)    Hypertension/chronic diastolic heart failure NYHA I -Continue Cozaar and Lopressor -Compensated without clinical signs of heart failure    Restless leg syndrome -Continue Mirapex-have ordered a low dose 3 times a day since dose was not listed on medication reconciliation-final dosing based on pharmacist review of home medications -Continue Cymbalta and Deseryl    OSA (obstructive sleep apnea) -Provide nocturnal CPAP if patient utilizes    HLD (hyperlipidemia) -Continue Lipitor -Follow up on lipid panel    BPH with obstruction/lower urinary tract symptoms -Monitor for potential urinary retention    DVT Prophylaxis: Lovenox  Family Communication:  Daughter at bedside  Code Status:  Full code  Condition:  Stable  Discharge disposition: Anticipate discharge to home environment pending stroke evaluation; and to supinate return to additional OT and physical therapy as well as Botox injections with Dr. Providence Lanius  Time spent in minutes : 60      Kaoru Benda L. ANP on 03/18/2015 at 12:37 PM  Between 7am to 7pm - Pager - [phone]  After 7pm go to [email or web address] - password [email or web address]  And look for the night coverage person covering me after hours  [facility]

## 2015-03-18 NOTE — ED Notes (Signed)
Pt in MRI.

## 2015-03-18 NOTE — ED Provider Notes (Signed)
CSN: [id number]     Arrival date & time 03/18/15  0818 History   First MD Initiated Contact with Patient 03/18/15 [phone]     Chief Complaint  Patient presents with  . Stroke Symptoms     (Consider location/radiation/quality/duration/timing/severity/associated sxs/prior Treatment) Patient is a 81 y.o. male presenting with neurologic complaint. The history is provided by the patient.  Neurologic Problem This is a new problem. The current episode started yesterday. The problem occurs constantly. The problem has not changed since onset.Associated symptoms include headaches. Pertinent negatives include no chest pain, no abdominal pain and no shortness of breath. Nothing aggravates the symptoms. Nothing relieves the symptoms. He has tried nothing for the symptoms. The treatment provided no relief.    Past Medical History  Diagnosis Date  . HLD (hyperlipidemia)   . Restless leg syndrome     well controlled on mirapex  . OSA on CPAP     sleep study 01/2014 - severe, CPAP at 10, previously on nuvigil (Dr. [name])  . Type 2 diabetes, controlled, with neuropathy 2011  . H/O hiatal hernia   . GERD (gastroesophageal reflux disease)     with sliding HH  . Migraines 03/11/12    now rare  . CVA (cerebral infarction) 03/07/12    slurred speech; right hemplegia, left handed - completed PT 07/2014 (17 sessions)  . Basal cell carcinoma of nose 2002    bridge of nose  . History of kidney problems 12/2010    lacerated left kidney after fall  . BPH with obstruction/lower urinary tract symptoms     failed flomax, uroxatral, myrbetriq - sees urology [name]) pending videourodynamics  . Depression     mild, on cymbalta per prior PCP  . Palpitations 01/2012    holter WNL, rare PAC/PVCs  . Erectile dysfunction     per urology (prostaglandin injection)  . Kidney cysts 02/2014    bilateral (renal US)  . CKD (chronic kidney disease) stage 3, GFR 30-59 ml/min 03/11/2012    Renal US 02/2014 - multiple  kidney cysts   . TIA (transient ischemic attack)   . Anemia    Past Surgical History  Procedure Laterality Date  . Prostate surgery  02/2009    PVP (laser)  . Tonsillectomy and adenoidectomy  ~ 1945  . Hemorrhoid surgery  1991  . Cataract extraction w/ intraocular lens implant  11//2012 - 08/2011  . Inguinal hernia repair  1981; 2002    left, then repaired  . Skin cancer excision  2002    bridge of nose, [facility]  . Colonoscopy  05/2011    normal per pt (Dr. [name], [place], [name])  . Mri brain  04/2010    WNL  . Esophagogastroduodenoscopy  2006    duodenitis, medual HH, Grade 2 reflux, mild gastritis  . Colonoscopy  03/2011    poor prep, unable to biopsy polyp in tic fold, rpt 3 mo [name])  . Abi  2007    WNL  . Urethral dilation  2014    tannenbaum  . Cystoscopy with urethral dilatation N/A 07/24/2013    Procedure: CYSTOSCOPY WITH URETHRAL DILATATION;  Surgeon: [name], MD;  Location: [facility];  Service: Urology;  Laterality: N/A;  . Transurethral incision of prostate N/A 07/24/2013    Procedure: TRANSURETHRAL INCISION OF THE PROSTATE (TUIP);  Surgeon: [name], MD;  Location: [facility];  Service: Urology;  Laterality: N/A;  . Esophagogastroduodenoscopy  07/2011    mild chronic gastritis  .  Colonoscopy  06/2011    mult polyps adenomatous, severe diverticulosis, rpt 3 yrs [name])  . Carotid endarterectomy    . Cardiac catheterization  2004    normal; [facility]  . Cardiovascular stress test  02/2014    WNL, low risk scan, EF 68%   Family History  Problem Relation Age of Onset  . Stroke Father   . Lung cancer Father 77     smoker  . Stroke Brother   . Diabetes Brother 59  . Coronary artery disease Neg Hx   . Lung cancer Brother   . Alcoholism Sister   . Kidney disease Sister    History  Substance Use Topics  . Smoking status: Never Smoker   . Smokeless tobacco: Never Used     Comment: "used to smoke pipe when I was younger"  . Alcohol Use: 2.4  oz/week    2 Glasses of wine, 2 Shots of liquor per week     Comment: occasional    Review of Systems  Constitutional: Positive for appetite change and fatigue. Negative for fever.  HENT: Negative for drooling and rhinorrhea.   Eyes: Negative for pain.  Respiratory: Negative for cough and shortness of breath.   Cardiovascular: Negative for chest pain and leg swelling.  Gastrointestinal: Negative for nausea, vomiting, abdominal pain and diarrhea.  Genitourinary: Negative for dysuria and hematuria.  Musculoskeletal: Negative for gait problem and neck pain.  Skin: Negative for color change.  Neurological: Positive for weakness and headaches. Negative for numbness.  Hematological: Negative for adenopathy.  Psychiatric/Behavioral: Negative for behavioral problems.  All other systems reviewed and are negative.     Allergies  Lisinopril; Nexium; and Niacin and related  Home Medications   Prior to Admission medications   Medication Sig Start Date End Date Taking? Authorizing Provider  atorvastatin (LIPITOR) 40 MG tablet TAKE 1 TABLET DAILY 01/19/15   [name], MD  clopidogrel (PLAVIX) 75 MG tablet TAKE 1 TABLET DAILY (NEED OFFICE VISIT. PATIENT MUST MAKE AN APPOINTMENT FOR ANY FURTHER REFILLS [phone]) 03/05/15   [name], MD  Coenzyme Q10 (COQ10) 150 MG CAPS Take 1 capsule by mouth 2 (two) times daily.    Historical Provider, MD  diazepam (VALIUM) 5 MG tablet Take 1 tablet (5 mg total) by mouth every 8 (eight) hours as needed for anxiety.    [name], MD  DULoxetine (CYMBALTA) 60 MG capsule TAKE 1 CAPSULE DAILY 04/23/14   [name], MD  Folic Acid-Vit B1-QXI H03 (FOLBEE) 2.5-25-1 MG TABS tablet Take 1 tablet by mouth daily. 10/04/12   [name], MD  ibuprofen (ADVIL,MOTRIN) 200 MG tablet Take 400 mg by mouth every 6 (six) hours as needed for mild pain or moderate pain.    Historical Provider, MD  Insulin Glargine (LANTUS SOLOSTAR) 100 UNIT/ML Solostar  Pen Inject 60 Units into the skin at bedtime. Patient taking differently: Inject 50 Units into the skin at bedtime.  09/28/14   [name], MD  losartan (COZAAR) 25 MG tablet Take 1 tablet (25 mg total) by mouth daily.    [name], MD  metoprolol tartrate (LOPRESSOR) 25 MG tablet TAKE 1 TABLET TWICE A DAY 01/04/15   [name], MD  NITROSTAT 0.4 MG SL tablet DISSOLVE 1 TABLET UNDER THE TONGUE EVERY 5 MINUTES AS NEEDED FOR CHEST PAIN 04/23/14   [name], MD  NOVOFINE 30G X 8 MM MISC USE ONE DAILY AS DIRECTED 01/04/15   [name], MD  omega-3 acid ethyl esters (LOVAZA) 1  G capsule TAKE 2 CAPSULES (2 GRAMS TOTAL) TWICE A DAY 12/15/14   [name], MD  omeprazole (PRILOSEC) 40 MG capsule TAKE 1 CAPSULE TWICE A DAY AS NEEDED 02/22/15   [name], MD  pramipexole (MIRAPEX) 1 MG tablet TAKE 1 TABLET AT BEDTIME (NEED OFFICE VISIT. PATIENT MUST MAKE AN APPOINTMENT FOR ANY FURTHER REFILLS [phone]) 03/05/15   [name], MD  sitaGLIPtin-metformin (JANUMET) 50-1000 MG per tablet Take 1 tablet by mouth 2 (two) times daily with a meal.    Historical Provider, MD  traZODone (DESYREL) 50 MG tablet Take 25 mg by mouth at bedtime as needed. 01/05/15   Historical Provider, MD  vitamin C (ASCORBIC ACID) 500 MG tablet Take 1,000 mg by mouth daily.     Historical Provider, MD   BP 126/56 mmHg  Pulse 62  Temp(Src) 98.3 F (36.8 C) (Oral)  Resp 11  Ht 5\' 8"  (1.727 m)  Wt 180 lb (81.647 kg)  BMI 27.38 kg/m2  SpO2 100% Physical Exam  Constitutional: He is oriented to person, place, and time. He appears well-developed and well-nourished.  HENT:  Head: Normocephalic and atraumatic.  Right Ear: External ear normal.  Left Ear: External ear normal.  Nose: Nose normal.  Mouth/Throat: Oropharynx is clear and moist. No oropharyngeal exudate.  Eyes: Conjunctivae and EOM are normal. Pupils are equal, round, and reactive to light.  Neck: Normal range of motion. Neck supple.   Cardiovascular: Normal rate, regular rhythm, normal heart sounds and intact distal pulses.  Exam reveals no gallop and no friction rub.   No murmur heard. Pulmonary/Chest: Effort normal and breath sounds normal. No respiratory distress. He has no wheezes.  Abdominal: Soft. Bowel sounds are normal. He exhibits no distension. There is no tenderness. There is no rebound and no guarding.  Musculoskeletal: Normal range of motion. He exhibits edema (trace pitting edema distal lower extremities). He exhibits no tenderness.  Neurological: He is alert and oriented to person, place, and time.  alert, oriented x3 speech: normal in context and clarity memory: intact grossly cranial nerves II-XII: intact motor strength: 4/5 in RUE and RLE, otherwise full proximally and distally no involuntary movements or tremors sensation: intact to light touch diffusely  cerebellar: finger-to-nose and heel-to-shin intact gait: Antalgic gait with mild instability.  Skin: Skin is warm and dry.  Psychiatric: He has a normal mood and affect. His behavior is normal.  Nursing note and vitals reviewed.   ED Course  Procedures (including critical care time) Labs Review Labs Reviewed  CBC - Abnormal; Notable for the following:    Hemoglobin 12.1 (*)    HCT 36.7 (*)    All other components within normal limits  COMPREHENSIVE METABOLIC PANEL - Abnormal; Notable for the following:    Glucose, Bld 161 (*)    Albumin 3.1 (*)    ALT 14 (*)    GFR calc non Af Amer 54 (*)    All other components within normal limits  PROTIME-INR - Abnormal; Notable for the following:    Prothrombin Time 15.4 (*)    All other components within normal limits  URINALYSIS, ROUTINE W REFLEX MICROSCOPIC (NOT AT [facility]) - Abnormal; Notable for the following:    Glucose, UA 100 (*)    All other components within normal limits  I-STAT TROPOININ, ED    Imaging Review Ct Head Wo Contrast  03/18/2015   CLINICAL DATA:  81 year old male with a  history of stroke symptoms  EXAM: CT HEAD WITHOUT CONTRAST  TECHNIQUE:  Contiguous axial images were obtained from the base of the skull through the vertex without intravenous contrast.  COMPARISON:  02/23/2015, prior MR head 03/11/2012, CT 03/11/2012  FINDINGS: Unremarkable appearance of the calvarium without acute fracture or aggressive lesion.  Unremarkable appearance of the scalp soft tissues.  Unremarkable appearance of the bilateral orbits.  Bilateral lens extraction  Mastoid air cells are clear.  No significant paranasal sinus disease  No acute intracranial hemorrhage, midline shift, or mass effect.  Gray-white differentiation is maintained, without CT evidence of acute ischemia.  Unremarkable configuration of the ventricles.  Re- demonstration of encephalomalacia secondary to prior infarction of the left posterior basal ganglia. Associated encephalomalacia changes of the left-sided centrum semi-ovale .  IMPRESSION: No CT evidence of acute intracranial abnormality.  Re- demonstration of encephalomalacia of the left posterior basal ganglia and centrum semi-ovale, status post remote infarction.  Signed,  Dulcy Fanny. Earleen Newport, DO  [facility]  [facility]   Electronically Signed   By: Corrie Mckusick D.O.   On: 03/18/2015 11:07     EKG Interpretation   Date/Time:  Thursday March 18 2015 08:25:41 EDT Ventricular Rate:  60 PR Interval:  181 QRS Duration: 98 QT Interval:  402 QTC Calculation: 402 R Axis:   -42 Text Interpretation:  Sinus rhythm Left anterior fascicular block Abnormal  R-wave progression, late transition No significant change since last  tracing Confirmed by Aissatou Fronczak  MD, Tasneem Cormier ([id number]) on 03/18/2015 8:30:32 AM      MDM   Final diagnoses:  Transient cerebral ischemia, unspecified transient cerebral ischemia type    9:17 AM 81 y.o. male w hx of CKD, CVA w/ right sided weakness, DM, on plavix who presents with multiple complaints. He states  that he felt like he had a clouded sensorium yesterday throughout the day. He also felt generalized weakness. He had loss of appetite. He states that he went to bed early and woke up around 11 PM and felt like his right hand was contracted and not working right. He did state that this resolved and he went back to bed. This morning when he got up he felt some instability with walking and some worsening weakness on his right side. Vital signs unremarkable here. He also complains of a mild 6 out of 10 right-sided temporal headache which she has had for approximately one week which she associates with a right lower molar tooth extraction recently. We'll get screening labs and imaging. Will treat headache with migraine cocktail.  Will admit to the hospitalist. I discussed case with the neuro hospitalist as well.  Pamella Pert, MD 03/18/15 1214

## 2015-03-18 NOTE — Progress Notes (Signed)
EEG Completed; Results Pending  

## 2015-03-19 ENCOUNTER — Inpatient Hospital Stay (HOSPITAL_COMMUNITY): Payer: Medicare Other

## 2015-03-19 DIAGNOSIS — I5032 Chronic diastolic (congestive) heart failure: Secondary | ICD-10-CM

## 2015-03-19 DIAGNOSIS — Z8673 Personal history of transient ischemic attack (TIA), and cerebral infarction without residual deficits: Secondary | ICD-10-CM

## 2015-03-19 DIAGNOSIS — I6789 Other cerebrovascular disease: Secondary | ICD-10-CM

## 2015-03-19 DIAGNOSIS — N401 Enlarged prostate with lower urinary tract symptoms: Secondary | ICD-10-CM

## 2015-03-19 DIAGNOSIS — M6289 Other specified disorders of muscle: Secondary | ICD-10-CM

## 2015-03-19 LAB — LIPID PANEL
Cholesterol: 56 mg/dL (ref 0–200)
HDL: 16 mg/dL — ABNORMAL LOW (ref >40)
LDL Cholesterol: 26 mg/dL (ref 0–99)
Total CHOL/HDL Ratio: 3.5 RATIO
Triglycerides: 68 mg/dL (ref <150)
VLDL: 14 mg/dL (ref 0–40)

## 2015-03-19 LAB — GLUCOSE, CAPILLARY
Glucose-Capillary: 63 mg/dL — ABNORMAL LOW (ref 65–99)
Glucose-Capillary: 70 mg/dL (ref 65–99)
Glucose-Capillary: 121 mg/dL — ABNORMAL HIGH (ref 65–99)

## 2015-03-19 MED ORDER — METOPROLOL TARTRATE 12.5 MG HALF TABLET: 12.5000 mg | ORAL_TABLET | Freq: Two times a day (BID) | ORAL | Status: DC

## 2015-03-19 MED ORDER — INSULIN GLARGINE 100 UNIT/ML ~~LOC~~ SOLN
40.0000 [IU] | Freq: Every day | SUBCUTANEOUS | Status: DC
  Filled 2015-03-19: qty 0.4

## 2015-03-19 MED ORDER — METOPROLOL TARTRATE 25 MG PO TABS: 12.5000 mg | ORAL_TABLET | Freq: Two times a day (BID) | ORAL | Status: DC

## 2015-03-19 MED ORDER — NAPROXEN 250 MG PO TABS
250.0000 mg | ORAL_TABLET | Freq: Once | ORAL | Status: AC
  Administered 2015-03-19: 250 mg via ORAL
  Filled 2015-03-19: qty 1

## 2015-03-19 NOTE — Discharge Summary (Signed)
[facility]  Physician Discharge Summary   Patient ID: James Bell MRN: [id number] DOB/AGE: 79-Dec-1932 79 y.o.  Admit date: 03/18/2015 Discharge date: 03/19/2015  PCP: [name], MD  DISCHARGE DIAGNOSES:  Principal Problem:   Acute right-sided weakness Active Problems:   History of CVA (cerebrovascular accident)   Type 2 diabetes, controlled, with neuropathy   OSA (obstructive sleep apnea)   CKD (chronic kidney disease) stage 3, GFR 30-59 ml/min   HLD (hyperlipidemia)   Restless leg syndrome   BPH with obstruction/lower urinary tract symptoms   Spastic hemiparesis affecting dominant side   HTN (hypertension)   Chronic diastolic heart failure, NYHA class 1   RECOMMENDATIONS FOR OUTPATIENT FOLLOW UP: 1. Patient to continue outpatient physical therapy 2. Dose of metoprolol has been reduced due to borderline low blood pressures.   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 He apparently also had some speech abnormalities. He was admitted for further management.  Consultations:  Neurology  Procedures: 2-D echocardiogram Study Conclusions - Left ventricle: The cavity size was normal. Wall thickness wasnormal. Systolic function was normal. The estimated ejectionfraction was in the range of 60% to 65%. Wall motion was normal;there were no regional wall motion abnormalities. Dopplerparameters are consistent with abnormal left ventricularrelaxation (grade 1 diastolic dysfunction). - Aortic valve: There was no stenosis. - Mitral valve: There was trivial  regurgitation. - Right ventricle: The cavity size was normal. Systolic functionwas normal. - Pulmonary arteries: PA peak pressure: 25 mm Hg (S). - Inferior vena cava: The vessel was normal in size. Therespirophasic diameter changes were in the normal range (>= 50%),consistent with normal central venous pressure. Impressions: - Normal LV size with EF 60-65%. Normal RV size and systolicfunction. No significant valvular abnormalities.  Carotid Doppler Bilateral carotid artery duplex completed: 1-39% ICA stenosis. Vertebral artery flow is antegrade.  EEG "Clinical Interpretation: This normal EEG is recorded in the waking and sleep state. There was no seizure or seizure predisposition recorded on this study."  HOSPITAL COURSE:   Right-sided weakness in the setting of prior stroke MRI was negative for acute infarct. Patient's deficits have resolved this morning and he is back to baseline. He does have residual right hemiparesis from his previous stroke. Carotid Dopplers as above. Echocardiogram is pending. He has been seen by neurology. They did not feel that he had a new event or even a TIA.  He should continue with his current antiplatelets agent including Plavix. Continue other medications for risk reduction. Patient goes for outpatient physical therapy which she may continue to do. LDL is 26.  Type 2 diabetes with neuropathy He had an episode of hypoglycemia this morning. Likely due to poor oral intake yesterday. Subsequent blood sugars have been normal. Continue his home medication regimen. HbA1c is pending  Chronic kidney disease stage III Stable  History of essential hypertension and chronic diastolic congestive heart failure Well compensated and stable. Patient's blood pressure was borderline low at the time of admission. Neurology has reduced the dose of his metoprolol.  History of restless leg syndrome Continue Mirapex.  History of obstructive sleep apnea Continue  CPAP  Overall stable and back to baseline. Okay for discharge  if Echocardiogram is low risk.   PERTINENT LABS:  The results of significant diagnostics from this hospitalization (including imaging, microbiology, ancillary and laboratory) are listed below for reference.      Labs: Basic Metabolic Panel:  Recent Labs Lab 03/18/15 0921  NA 139  K 3.9  CL 104  CO2 28  GLUCOSE 161*  BUN 15  CREATININE 1.22  CALCIUM 8.9   Liver Function Tests:  Recent Labs Lab 03/18/15 0921  AST 18  ALT 14*  ALKPHOS 38  BILITOT 0.4  PROT 6.8  ALBUMIN 3.1*   CBC:  Recent Labs Lab 03/18/15 0921  WBC 7.9  HGB 12.1*  HCT 36.7*  MCV 85.5  PLT 192   CBG:  Recent Labs Lab 03/18/15 1719 03/18/15 2002 03/19/15 0658 03/19/15 0730 03/19/15 1116  GLUCAP 144* 231* 63* 70 121*     IMAGING STUDIES Dg Chest 2 View  03/18/2015   CLINICAL DATA:  Weakness, confusion.  EXAM: CHEST  2 VIEW  COMPARISON:  February 23, 2015.  FINDINGS: The heart size and mediastinal contours are within normal limits. Both lungs are clear. No pneumothorax or pleural effusion is noted. The visualized skeletal structures are unremarkable.  IMPRESSION: No active cardiopulmonary disease.   Electronically Signed   By: Marijo Conception, M.D.   On: 03/18/2015 14:10   Dg Chest 2 View  02/23/2015   CLINICAL DATA:  Gait disturbance, history hyperlipidemia, type 2 diabetes, chronic kidney disease  EXAM: CHEST  2 VIEW  COMPARISON:  11/29/2012  FINDINGS: Normal heart size, mediastinal contours, and pulmonary vascularity.  Minimal chronic peribronchial thickening.  Lungs otherwise clear.  No pleural effusion or pneumothorax.  Scattered endplate spur formation thoracic spine.  IMPRESSION: No acute abnormalities.   Electronically Signed   By: Lavonia Dana M.D.   On: 02/23/2015 12:55   Ct Head Wo Contrast  03/18/2015   CLINICAL DATA:  81 year old male with a history of stroke symptoms  EXAM: CT HEAD WITHOUT CONTRAST  TECHNIQUE:  Contiguous axial images were obtained from the base of the skull through the vertex without intravenous contrast.  COMPARISON:  02/23/2015, prior MR head 03/11/2012, CT 03/11/2012  FINDINGS: Unremarkable appearance of the calvarium without acute fracture or aggressive lesion.  Unremarkable appearance of the scalp soft tissues.  Unremarkable appearance of the bilateral orbits.  Bilateral lens extraction  Mastoid air cells are clear.  No significant paranasal sinus disease  No acute intracranial hemorrhage, midline shift, or mass effect.  Gray-white differentiation is maintained, without CT evidence of acute ischemia.  Unremarkable configuration of the ventricles.  Re- demonstration of encephalomalacia secondary to prior infarction of the left posterior basal ganglia. Associated encephalomalacia changes of the left-sided centrum semi-ovale .  IMPRESSION: No CT evidence of acute intracranial abnormality.  Re- demonstration of encephalomalacia of the left posterior basal ganglia and centrum semi-ovale, status post remote infarction.  Signed,  Dulcy Fanny. Earleen Newport, DO  [facility]  [facility]   Electronically Signed   By: Corrie Mckusick D.O.   On: 03/18/2015 11:07   Ct Head Wo Contrast  02/23/2015   CLINICAL DATA:  Changing gait, staggering today, slurred speech, question confusion, history hyperlipidemia, type 2 diabetes, prior TIA and stroke  EXAM: CT HEAD WITHOUT CONTRAST  TECHNIQUE: Contiguous axial images were obtained from the base of the skull through the vertex without intravenous contrast.  COMPARISON:  03/11/2012  FINDINGS: Generalized atrophy.  Normal ventricular morphology.  No midline shift or mass effect.  Old LEFT basal ganglia lacunar infarct.  Small vessel chronic ischemic changes of deep cerebral white matter.  No intracranial hemorrhage, mass lesion, or evidence acute infarction.  No extra-axial fluid collections.  Visualized paranasal sinuses and mastoid air  cells clear.  Bones unremarkable.  IMPRESSION: Atrophy with small vessel chronic ischemic changes of deep cerebral white matter.  Old LEFT basal ganglia lacunar infarct.  No acute intracranial abnormalities.   Electronically Signed   By: Lavonia Dana M.D.   On: 02/23/2015 13:00   Mr Jodene Nam Head Wo Contrast  03/18/2015   CLINICAL DATA:  Progressive right-sided weakness and abnormal speech since last evening.  EXAM: MRI HEAD WITHOUT CONTRAST  MRA HEAD WITHOUT CONTRAST  TECHNIQUE: Multiplanar, multiecho pulse sequences of the brain and surrounding structures were obtained without intravenous contrast. Angiographic images of the head were obtained using MRA technique without contrast.  COMPARISON:  CT head 03/18/2015.  MRI brain 03/11/2012.  FINDINGS: MRI HEAD FINDINGS  The diffusion-weighted images demonstrate no evidence for acute or subacute infarct. A remote hemorrhagic posterior left lentiform nucleus infarct is noted. White matter changes extend superiorly into the left coronal radiata. More mild periventricular rete T2 changes are evident bilaterally. No other focal infarcts are present.  Flow is present in the major intracranial arteries. Bilateral lens replacements are noted. The paranasal sinuses and mastoid air cells are clear.  Skullbase is within normal limits. Midline structures are unremarkable.  MRA HEAD FINDINGS  The internal carotid arteries are within normal limits from the high cervical segments through the ICA termini. The A1 and M1 segments are normal. The anterior communicating artery is patent. MCA bifurcations are within normal limits. The ACA and MCA branch vessels are normal.  The left vertebral artery is the dominant vessel. The right PICA origin is visualized and normal. The left PICA origin is below the field of view. The basilar artery is normal. Both posterior cerebral arteries originate from the basilar tip. The PCA branch vessels are within normal limits.  IMPRESSION: 1. Remote  hemorrhagic infarct of the posterior left lentiform nucleus with asymmetric white matter disease extending into the left coronal radiata. 2. Mild atrophy and white matter disease is otherwise within normal limits for age. 3. No acute intracranial abnormality. 4. Normal variant circle of Willis without evidence for significant proximal stenosis, aneurysm, or branch vessel occlusion.   Electronically Signed   By: San Morelle M.D.   On: 03/18/2015 14:10   Mr Brain Wo Contrast  03/18/2015   CLINICAL DATA:  Progressive right-sided weakness and abnormal speech since last evening.  EXAM: MRI HEAD WITHOUT CONTRAST  MRA HEAD WITHOUT CONTRAST  TECHNIQUE: Multiplanar, multiecho pulse sequences of the brain and surrounding structures were obtained without intravenous contrast. Angiographic images of the head were obtained using MRA technique without contrast.  COMPARISON:  CT head 03/18/2015.  MRI brain 03/11/2012.  FINDINGS: MRI HEAD FINDINGS  The diffusion-weighted images demonstrate no evidence for acute or subacute infarct. A remote hemorrhagic posterior left lentiform nucleus infarct is noted. White matter changes extend superiorly into the left coronal radiata. More mild periventricular rete T2 changes are evident bilaterally. No other focal infarcts are present.  Flow is present in the major intracranial arteries. Bilateral lens replacements are noted. The paranasal sinuses and mastoid air cells are clear.  Skullbase is within normal limits. Midline structures are unremarkable.  MRA HEAD FINDINGS  The internal carotid arteries are within normal limits from the high cervical segments through the ICA termini. The A1  and M1 segments are normal. The anterior communicating artery is patent. MCA bifurcations are within normal limits. The ACA and MCA branch vessels are normal.  The left vertebral artery is the dominant vessel. The right PICA origin is visualized and normal. The left PICA origin is below the field of  view. The basilar artery is normal. Both posterior cerebral arteries originate from the basilar tip. The PCA branch vessels are within normal limits.  IMPRESSION: 1. Remote hemorrhagic infarct of the posterior left lentiform nucleus with asymmetric white matter disease extending into the left coronal radiata. 2. Mild atrophy and white matter disease is otherwise within normal limits for age. 3. No acute intracranial abnormality. 4. Normal variant circle of Willis without evidence for significant proximal stenosis, aneurysm, or branch vessel occlusion.   Electronically Signed   By: San Morelle M.D.   On: 03/18/2015 14:10    DISCHARGE EXAMINATION: Filed Vitals:   03/18/15 2336 03/19/15 0126 03/19/15 0443 03/19/15 1028  BP: 115/59 112/53 96/53 147/65  Pulse: 77 61 72 68  Temp: 98.6 F (37 C) 98.1 F (36.7 C) 98.5 F (36.9 C) 98.1 F (36.7 C)  TempSrc: Oral Oral Oral Oral  Resp: 18 18 18 18   Height:      Weight:      SpO2: 99% 99% 100% 100%   General appearance: alert, cooperative, appears stated age and no distress Resp: clear to auscultation bilaterally Cardio: regular rate and rhythm, S1, S2 normal, no murmur, click, rub or gallop GI: soft, non-tender; bowel sounds normal; no masses,  no organomegaly Extremities: extremities normal, atraumatic, no cyanosis or edema Neurologic: Weakness noted in the right arm which is old. Patient feels like he is back to baseline. No deficits in the lower extremities.  DISPOSITION: Home  Discharge Instructions    Call MD for:  difficulty breathing, headache or visual disturbances    Complete by:  As directed      Call MD for:  extreme fatigue    Complete by:  As directed      Call MD for:  persistant dizziness or light-headedness    Complete by:  As directed      Call MD for:  persistant nausea and vomiting    Complete by:  As directed      Call MD for:  severe uncontrolled pain    Complete by:  As directed      Call MD for:   temperature >100.4    Complete by:  As directed      Diet Carb Modified    Complete by:  As directed      Discharge instructions    Complete by:  As directed   Please resume outpatient physical and occupational therapy. Please follow up with your PCP next week.  You were cared for by a hospitalist during your hospital stay. If you have any questions about your discharge medications or the care you received while you were in the hospital after you are discharged, you can call the unit and asked to speak with the hospitalist on call if the hospitalist that took care of you is not available. Once you are discharged, your primary care physician will handle any further medical issues. Please note that NO REFILLS for any discharge medications will be authorized once you are discharged, as it is imperative that you return to your primary care physician (or establish a relationship with a primary care physician if you do not have one) for your aftercare needs so  that they can reassess your need for medications and monitor your lab values. If you do not have a primary care physician, you can call [phone] for a physician referral.     Increase activity slowly    Complete by:  As directed            ALLERGIES:  Allergies  Allergen Reactions  . Lisinopril Cough    tachycardia  . Nexium [Esomeprazole] Other (See Comments)    Headache  . Niacin And Related Other (See Comments)    flushing     Current Discharge Medication List    CONTINUE these medications which have CHANGED   Details  metoprolol tartrate (LOPRESSOR) 25 MG tablet Take 0.5 tablets (12.5 mg total) by mouth 2 (two) times daily. Qty: 180 tablet, Refills: 1      CONTINUE these medications which have NOT CHANGED   Details  atorvastatin (LIPITOR) 40 MG tablet TAKE 1 TABLET DAILY Qty: 90 tablet, Refills: 1    clopidogrel (PLAVIX) 75 MG tablet TAKE 1 TABLET DAILY (NEED OFFICE VISIT. PATIENT MUST MAKE AN APPOINTMENT FOR ANY FURTHER  REFILLS [phone]) Qty: 30 tablet, Refills: 0    Coenzyme Q10 (COQ10) 150 MG CAPS Take 1 capsule by mouth 2 (two) times daily.    diazepam (VALIUM) 5 MG tablet Take 1 tablet (5 mg total) by mouth every 8 (eight) hours as needed for anxiety. Qty: 30 tablet, Refills: 3    DULoxetine (CYMBALTA) 60 MG capsule TAKE 1 CAPSULE DAILY Qty: 90 capsule, Refills: 2    Folic Acid-Vit Y6-VZC H88 (FOLBEE) 2.5-25-1 MG TABS tablet Take 1 tablet by mouth daily.    ibuprofen (ADVIL,MOTRIN) 200 MG tablet Take 400 mg by mouth every 6 (six) hours as needed for mild pain or moderate pain.    Insulin Glargine (LANTUS SOLOSTAR) 100 UNIT/ML Solostar Pen Inject 60 Units into the skin at bedtime. Qty: 3 pen, Refills: 2    losartan (COZAAR) 25 MG tablet Take 1 tablet (25 mg total) by mouth daily. Qty: 90 tablet, Refills: 3    NITROSTAT 0.4 MG SL tablet DISSOLVE 1 TABLET UNDER THE TONGUE EVERY 5 MINUTES AS NEEDED FOR CHEST PAIN Qty: 30 tablet, Refills: 0    NOVOFINE 30G X 8 MM MISC USE ONE DAILY AS DIRECTED Qty: 100 each, Refills: 2    omega-3 acid ethyl esters (LOVAZA) 1 G capsule TAKE 2 CAPSULES (2 GRAMS TOTAL) TWICE A DAY Qty: 360 capsule, Refills: 1    omeprazole (PRILOSEC) 40 MG capsule TAKE 1 CAPSULE TWICE A DAY AS NEEDED Qty: 180 capsule, Refills: 1    pramipexole (MIRAPEX) 1 MG tablet TAKE 1 TABLET AT BEDTIME (NEED OFFICE VISIT. PATIENT MUST MAKE AN APPOINTMENT FOR ANY FURTHER REFILLS [phone]) Qty: 30 tablet, Refills: 0    sitaGLIPtin-metformin (JANUMET) 50-1000 MG per tablet Take 1 tablet by mouth 2 (two) times daily with a meal.    traZODone (DESYREL) 50 MG tablet Take 25 mg by mouth at bedtime as needed.    vitamin C (ASCORBIC ACID) 500 MG tablet Take 1,000 mg by mouth daily.        Follow-up Information    Follow up with [name], MD. Schedule an appointment as soon as possible for a visit in 1 week.   Specialty:  Family Medicine   Why:  post hospitalization follow up    Contact information:   [place] [address] [phone]       TOTAL DISCHARGE TIME: 35 minutes  [facility] Pager [phone]  03/19/2015, 3:06 PM

## 2015-03-19 NOTE — Care Management Note (Signed)
Case Management Note  Patient Details  Name: James Bell MRN: [id number] Date of Birth: [birth year]  Subjective/Objective:                    Action/Plan: Received a call from bedside RN stating that discharge instructions noted that patient should resume his outpatient PT as prior to this hospitalization.  CM met with patient, who verified that he was actively receiving therapy at [facility].  CM spoke with Angie at [facility], who requested that the discharge summary with resumption instructions be faxed.  Information was sent as requested.  Bedside RN updated. Expected Discharge Date:                  Expected Discharge Plan:  Home/Self Care (Patient is active with [facility])  In-House Referral:     Discharge planning Services     Post Acute Care Choice:    Choice offered to:     DME Arranged:    DME Agency:     HH Arranged:    [place] Agency:     Status of Service:  Completed, signed off  Medicare Important Message Given:    Date Medicare IM Given:    Medicare IM give by:    Date Additional Medicare IM Given:    Additional Medicare Important Message give by:     If discussed at [place] of Stay Meetings, dates discussed:    Additional Comments:  Rolm Baptise, RN 03/19/2015, 3:26 PM

## 2015-03-19 NOTE — Progress Notes (Signed)
Bilateral carotid artery duplex completed:  1-39% ICA stenosis.  Vertebral artery flow is antegrade.     

## 2015-03-19 NOTE — Progress Notes (Signed)
STROKE TEAM PROGRESS NOTE   HISTORY James Bell is an 81 y.o. male with history of CVA and on Plavix. Previous stroke involved the right side and he has been going to therapy for right sided spasticity and taking Botox for right arm spasticity. Yesterday he went out to dinner with friends and noted he was not thinking as well and felt his right side was weaker. He went to sleep and noted when he awoke his right side was weaker and had increased spasticity. His daughter feels his speech is also more slurred.   Date last known well: Date: 03/17/2015 Time last known well: Time: 17:00 tPA Given: No: outside of window   SUBJECTIVE (INTERVAL HISTORY) No family members present. The patient feels that he is improving however he is not quite back to baseline. Dr. Leonie Man told the patient that his MRI was negative for a new stroke. He felt the symptoms were possibly due to dehydration and hypotension. He recommended continuing the Plavix 75 mg daily.   OBJECTIVE Temp:  [98.1 F (36.7 C)-99.2 F (37.3 C)] 98.1 F (36.7 C) (07/01 1028) Pulse Rate:  [61-83] 68 (07/01 1028) Cardiac Rhythm:  [-] Normal sinus rhythm (07/01 0745) Resp:  [18-20] 18 (07/01 1028) BP: (96-154)/(47-79) 147/65 mmHg (07/01 1028) SpO2:  [98 %-100 %] 100 % (07/01 1028)   Recent Labs Lab 03/18/15 1719 03/18/15 2002 03/19/15 0658 03/19/15 0730 03/19/15 1116  GLUCAP 144* 231* 63* 70 121*    Recent Labs Lab 03/18/15 0921  NA 139  K 3.9  CL 104  CO2 28  GLUCOSE 161*  BUN 15  CREATININE 1.22  CALCIUM 8.9    Recent Labs Lab 03/18/15 0921  AST 18  ALT 14*  ALKPHOS 38  BILITOT 0.4  PROT 6.8  ALBUMIN 3.1*    Recent Labs Lab 03/18/15 0921  WBC 7.9  HGB 12.1*  HCT 36.7*  MCV 85.5  PLT 192   No results for input(s): CKTOTAL, CKMB, CKMBINDEX, TROPONINI in the last 168 hours.  Recent Labs  03/18/15 0921  LABPROT 15.4*  INR 1.20    Recent Labs  03/18/15 1102  COLORURINE YELLOW  LABSPEC  1.005  PHURINE 6.5  GLUCOSEU 100*  HGBUR NEGATIVE  BILIRUBINUR NEGATIVE  KETONESUR NEGATIVE  PROTEINUR NEGATIVE  UROBILINOGEN 0.2  NITRITE NEGATIVE  LEUKOCYTESUR NEGATIVE       Component Value Date/Time   CHOL 56 03/19/2015 0410   TRIG 68 03/19/2015 0410   HDL 16* 03/19/2015 0410   CHOLHDL 3.5 03/19/2015 0410   VLDL 14 03/19/2015 0410   LDLCALC 26 03/19/2015 0410   Lab Results  Component Value Date   HGBA1C 6.7* 10/27/2013   No results found for: LABOPIA, COCAINSCRNUR, LABBENZ, AMPHETMU, THCU, LABBARB  No results for input(s): ETH in the last 168 hours.   Imaging   Dg Chest 2 View 03/18/2015    No active cardiopulmonary disease.      Ct Head Wo Contrast 03/18/2015    No CT evidence of acute intracranial abnormality.  Re- demonstration of encephalomalacia of the left posterior basal ganglia and centrum semi-ovale, status post remote infarction.      Mr Jodene Nam Head Wo Contrast 03/18/2015    1. Remote hemorrhagic infarct of the posterior left lentiform nucleus with asymmetric white matter disease extending into the left coronal radiata.  2. Mild atrophy and white matter disease is otherwise within normal limits for age.  3. No acute intracranial abnormality.  4. Normal variant circle of Willis without  evidence for significant proximal stenosis, aneurysm, or branch vessel occlusion.       PHYSICAL EXAM Pleasant elderly caucasian male not in distress. . Afebrile. Head is nontraumatic. Neck is supple without bruit.    Cardiac exam no murmur or gallop. Lungs are clear to auscultation. Distal pulses are well felt. Neurological Exam :  Awake alert oriented 3 with normal speech and language function. No aphasia, dysarthria or apraxia. Extraocular movements are full range without nystagmus. Fundi were not visualized. Vision acuity and fields seem adequate. Face is symmetric. Tongue is midline. Motor system exam reveals spastic right mild hemiparesis with weakness of right hand,  intrinsic hand muscles with non-fixed flexion contractures of the fingers. Mild right hip flexor and ankle dorsiflexor weakness. Tone is increased on the right compared to the left. Deep tendon reflexes are brisker on the right compared to the left. Sensation appears preserved bilaterally. Gait was not tested.   ASSESSMENT/PLAN James Bell is a 81 y.o. male with history of hyperlipidemia, obstructive sleep apnea, diabetes mellitus, previous stroke, chronic kidney disease, and previous TIAs presenting with slurred speech, increased right-sided weakness and spasticity. He did not receive IV t-PA due to late presentation.  TIA:  Dominant felt secondary to dehydration and low blood pressure.  Resultant - deficits resolving  MRI  no acute abnormality  MRA  Normal variant circle of Willis without evidence for significant proximal stenosis, aneurysm, or branch vessel occlusion.    Carotid Doppler Bilateral 1-39% ICA stenosis. Vertebral artery flow is antegrade.   2D Echo  pending  LDL 26  HgbA1c pending  EEG - normal  Lovenox for VTE prophylaxis Diet Carb Modified Fluid consistency:: Thin; Room service appropriate?: Yes  clopidogrel 75 mg orally every day prior to admission, now on clopidogrel 75 mg orally every day  Patient counseled to be compliant with his antithrombotic medications  Ongoing aggressive stroke risk factor management  Therapy recommendations: No needs identified.  Disposition:  Pending  Hypertension  Home meds: Cozaar and metoprolol  Blood pressure mildly low at times - Will decrease metoprolol to 12.5 mg twice daily.   Hyperlipidemia  Home meds: Lipitor 40 mg daily resumed in hospital  LDL 26, goal < 70  Continue statin at discharge  Diabetes  HgbA1c pending, goal < 7.0  Uncontrolled  Other Stroke Risk Factors  Advanced age  ETOH use  Hx stroke/TIA  Family hx stroke (Father)  Obstructive sleep apnea, on CPAP at  home    Other Active Problems  Mild anemia  Mildly low blood pressures   PLAN  OK to D/C when workup is complete. Hemoglobin A1c and 2-D echocardiogram pending.  The Stroke team will sign off at this time. Please call if we can be of further service.   Hospital day # 1  Mikey Bussing PA-C [facility] Pager [phone] 03/19/2015, 2:19 PM I have personally examined this patient, reviewed notes, independently viewed imaging studies, participated in medical decision making and plan of care. I have made any additions or clarifications directly to the above note. Agree with note above. He presented with transient worsening of existing right hemiparesis in the setting of possible dehydration. MRI scan does not show a new acute stroke. He remains at risk for recurrent stroke, TIA or neurological worsening and needs ongoing stroke evaluation and aggressive risk factor modification. Continue Plavix for stroke prevention.  Antony Contras, MD Medical Director [facility] Pager: [phone] 03/19/2015 3:01 PM    To contact  Stroke Continuity provider, please refer to [email or web address] After hours, contact General Neurology

## 2015-03-19 NOTE — Progress Notes (Addendum)
PT Discharge Note  Patient Details Name: James Bell MRN: [id number] DOB: 17-Nov-1930   Cancelled Treatment:    Reason Eval/Treat Not Completed: PT screened, no needs identified, will sign off  Spoke with occupational therapy after their initial evaluation. OT reports patient is functioning at his baseline mobility and no physical therapy is indicated at this time. Spoke with patient who confirms he is no longer having symptoms. States he has been making good progress with outpatient physical therapy and plans to continue working in that setting to improve his functional independence. RN ambulated with patient in hallway this AM. Declines need for comprehensive PT evaluation. PT is signing-off. Patient to continue with outpatient physical therapy upon d/c. Please re-order if there is any significant acute change in status. Thank you for this referral.  Elayne Snare, [place]     Ellouise Newer 03/19/2015, 12:43 PM [place], [place]

## 2015-03-19 NOTE — Progress Notes (Signed)
Discharge orders received. Pt educated on discharge instructions and stroke education. Pt verbalized understanding and handed discharge packet. IV and tele removed. Pt and belongings taken downstairs by staff via wheelchair.

## 2015-03-19 NOTE — Evaluation (Signed)
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 Presented to ED with slurred speech, "fogginess", and worsening R side weakness.    Clinical Impression   Pt reports no longer feeling "foggy", and reports improvement in R UE weakness. Pt demonstrated ability to complete grooming task at sink with supervision. Pt ambulated to bathroom, holding onto furniture for balance. Pt declined RW use, reports using quad cane at home for balance. Pt receiving outpatient OT prior to admission, and would benefit from continued outpatient OT to increase strength and function in R UE.     Follow Up Recommendations  Outpatient OT;Supervision - Intermittent    Equipment Recommendations  None recommended by OT    Recommendations for Other Services       Precautions / Restrictions Precautions Precautions: Fall Restrictions Weight Bearing Restrictions: No      Mobility Bed Mobility               General bed mobility comments: pt in chair upon arrival  Transfers Overall transfer level: Needs assistance Equipment used: None Transfers: Sit to/from Stand Sit to Stand: Supervision         General transfer comment: Pt reports using quad cane at home    Balance Overall balance assessment: Needs assistance Sitting-balance support: No upper extremity supported;Feet supported       Standing balance support: Single extremity supported;During functional activity                                ADL Overall ADL's : At baseline                                       General ADL Comments: Pt reports increased function with R UE, demonstrated ability to incorporate into functional activity; will benefit from continued outpatient OT to assist with ADL  independence     Vision Vision Assessment?: No apparent visual deficits   Perception     Praxis      Pertinent Vitals/Pain Pain Assessment: No/denies pain     Hand Dominance Left   Extremity/Trunk Assessment Upper Extremity Assessment Upper Extremity Assessment: RUE deficits/detail RUE Deficits / Details: weakness and spasticity from past CVA in 2013 RUE Coordination: decreased fine motor;decreased gross motor   Lower Extremity Assessment Lower Extremity Assessment: Defer to PT evaluation       Communication Communication Communication: No difficulties   Cognition Arousal/Alertness: Awake/alert Behavior During Therapy: WFL for tasks assessed/performed Overall Cognitive Status: Within Functional Limits for tasks assessed                     General Comments       Exercises       Shoulder Instructions      Home Living Family/patient expects to be discharged to:: Private residence Living Arrangements: Alone Available Help at Discharge: Family;Friend(s);Available 24 hours/day Type of Home: Apartment (Condo) Home Access: Level entry     Home Layout: One level     Bathroom Shower/Tub: Occupational psychologist: Standard     Home Equipment: Cane - quad;Grab bars - tub/shower   Additional Comments: Currently receiving outpatient PT/ OT  Prior Functioning/Environment Level of Independence: Independent        Comments: still drives    OT Diagnosis: Generalized weakness;Hemiplegia non-dominant side   OT Problem List: Decreased strength;Decreased range of motion;Decreased activity tolerance;Impaired balance (sitting and/or standing);Decreased knowledge of use of DME or AE;Impaired tone;Impaired UE functional use   OT Treatment/Interventions: Self-care/ADL training;Therapeutic exercise;DME and/or AE instruction;Therapeutic activities;Patient/family education;Balance training    OT Goals(Current goals can be found in the care plan  section) Acute Rehab OT Goals Patient Stated Goal: To cut steak with a knife OT Goal Formulation: With patient Time For Goal Achievement: 04/02/15 Potential to Achieve Goals: Good  OT Frequency: Min 2X/week   Barriers to D/C:            Co-evaluation              End of Session Equipment Utilized During Treatment: Gait belt Nurse Communication: Mobility status  Activity Tolerance: Patient tolerated treatment well Patient left: in chair;with call bell/phone within reach   Time: 1140-1153 OT Time Calculation (min): 13 min Charges:     OT General Charges  $OT Visit 1 Procedure  OT Evaluation  $Initial OT Evaluation Tier I 1 Procedure   G-Codes:    Forest Gleason 03/19/2015, 12:15 PM Read and agree with the above. [facility], OTR/L  [phone] 03/19/2015

## 2015-03-19 NOTE — Progress Notes (Signed)
  Echocardiogram 2D Echocardiogram has been performed.  James Bell M 03/19/2015, 10:06 AM

## 2015-03-19 NOTE — Progress Notes (Signed)
SLP Cancellation Note  Patient Details Name: James Bell MRN: [id number] DOB: 06/23/1931   Cancelled treatment:       Reason Eval/Treat Not Completed: SLP screened, no needs identified, will sign off. Pt feeling at baseline level of functioning; daughter at bedside confirms this as well. Speech was 100% intelligible, continued R side weakness residual from stroke in 2013, responded to questions appropriately without evidence of aphasia. MRI/ EEG showed no acute findings. SLP will sign off at this time. Please re-consult if needs arise.    Kern Reap, [place], CCC-SLP 03/19/2015, 12:21 PM  [phone]

## 2015-03-20 ENCOUNTER — Telehealth: Payer: Self-pay | Admitting: Family Medicine

## 2015-03-20 LAB — HEMOGLOBIN A1C
Hgb A1c MFr Bld: 6.3 % — ABNORMAL HIGH (ref 4.8–5.6)
Mean Plasma Glucose: 134 mg/dL

## 2015-03-20 NOTE — Telephone Encounter (Signed)
Discharged 03/19/2015. plz call for hosp f/u visit on Tues. Schedule f/u visit in next week. Thanks.

## 2015-03-23 ENCOUNTER — Ambulatory Visit: Payer: Medicare Other | Attending: Physical Medicine & Rehabilitation | Admitting: Occupational Therapy

## 2015-03-23 ENCOUNTER — Ambulatory Visit (INDEPENDENT_AMBULATORY_CARE_PROVIDER_SITE_OTHER): Payer: Medicare Other | Admitting: Family Medicine

## 2015-03-23 ENCOUNTER — Encounter: Payer: Self-pay | Admitting: Family Medicine

## 2015-03-23 ENCOUNTER — Encounter: Payer: Self-pay | Admitting: Occupational Therapy

## 2015-03-23 VITALS — BP 128/74 | HR 79

## 2015-03-23 VITALS — BP 114/66 | HR 67 | Temp 98.2°F | Wt 185.8 lb

## 2015-03-23 DIAGNOSIS — I10 Essential (primary) hypertension: Secondary | ICD-10-CM

## 2015-03-23 DIAGNOSIS — M25511 Pain in right shoulder: Secondary | ICD-10-CM | POA: Diagnosis not present

## 2015-03-23 DIAGNOSIS — H6121 Impacted cerumen, right ear: Secondary | ICD-10-CM | POA: Insufficient documentation

## 2015-03-23 DIAGNOSIS — R269 Unspecified abnormalities of gait and mobility: Secondary | ICD-10-CM | POA: Diagnosis not present

## 2015-03-23 DIAGNOSIS — R531 Weakness: Secondary | ICD-10-CM

## 2015-03-23 DIAGNOSIS — K219 Gastro-esophageal reflux disease without esophagitis: Secondary | ICD-10-CM

## 2015-03-23 DIAGNOSIS — H9190 Unspecified hearing loss, unspecified ear: Secondary | ICD-10-CM | POA: Insufficient documentation

## 2015-03-23 DIAGNOSIS — E1140 Type 2 diabetes mellitus with diabetic neuropathy, unspecified: Secondary | ICD-10-CM

## 2015-03-23 DIAGNOSIS — I69951 Hemiplegia and hemiparesis following unspecified cerebrovascular disease affecting right dominant side: Secondary | ICD-10-CM | POA: Diagnosis not present

## 2015-03-23 DIAGNOSIS — I639 Cerebral infarction, unspecified: Secondary | ICD-10-CM | POA: Diagnosis not present

## 2015-03-23 DIAGNOSIS — I69851 Hemiplegia and hemiparesis following other cerebrovascular disease affecting right dominant side: Secondary | ICD-10-CM

## 2015-03-23 DIAGNOSIS — H9191 Unspecified hearing loss, right ear: Secondary | ICD-10-CM

## 2015-03-23 DIAGNOSIS — I6980 Unspecified sequelae of other cerebrovascular disease: Secondary | ICD-10-CM | POA: Diagnosis not present

## 2015-03-23 DIAGNOSIS — I69351 Hemiplegia and hemiparesis following cerebral infarction affecting right dominant side: Secondary | ICD-10-CM

## 2015-03-23 DIAGNOSIS — F32A Depression, unspecified: Secondary | ICD-10-CM

## 2015-03-23 DIAGNOSIS — R279 Unspecified lack of coordination: Secondary | ICD-10-CM | POA: Insufficient documentation

## 2015-03-23 DIAGNOSIS — M6281 Muscle weakness (generalized): Secondary | ICD-10-CM | POA: Insufficient documentation

## 2015-03-23 DIAGNOSIS — F329 Major depressive disorder, single episode, unspecified: Secondary | ICD-10-CM

## 2015-03-23 DIAGNOSIS — M6289 Other specified disorders of muscle: Secondary | ICD-10-CM

## 2015-03-23 MED ORDER — DIAZEPAM 5 MG PO TABS: 5.0000 mg | ORAL_TABLET | Freq: Three times a day (TID) | ORAL | Status: DC | PRN

## 2015-03-23 MED ORDER — PANTOPRAZOLE SODIUM 40 MG PO TBEC: 40.0000 mg | DELAYED_RELEASE_TABLET | Freq: Two times a day (BID) | ORAL | Status: DC | PRN

## 2015-03-23 MED ORDER — CLOPIDOGREL BISULFATE 75 MG PO TABS: ORAL_TABLET | ORAL | Status: DC

## 2015-03-23 MED ORDER — PRAMIPEXOLE DIHYDROCHLORIDE 1 MG PO TABS: ORAL_TABLET | ORAL | Status: DC

## 2015-03-23 MED ORDER — METOPROLOL TARTRATE 25 MG PO TABS: 12.5000 mg | ORAL_TABLET | Freq: Two times a day (BID) | ORAL | Status: DC

## 2015-03-23 NOTE — Progress Notes (Signed)
Pre visit review using our clinic review tool, if applicable. No additional management support is needed unless otherwise documented below in the visit note. 

## 2015-03-23 NOTE — Assessment & Plan Note (Addendum)
Pt endorses decreased hearing. Failed hearing screen today on right at higher frequencies. Refer to audiology per pt request.

## 2015-03-23 NOTE — Therapy (Signed)
[facility] [address] [place], [name], [address] Phone: [phone]   Fax:  [phone]  Occupational Therapy Treatment  Patient Details  Name: James Bell MRN: [id number] Date of Birth: October 18, 1930 Referring Provider:  [name], MD  Encounter Date: 03/23/2015      OT End of Session - 03/23/15 1243    Visit Number 7   Number of Visits 16   Date for OT Re-Evaluation 04/13/15   Authorization Type MCR trad, tricare - G code needed   OT Start Time 0845   OT Stop Time 0928   OT Time Calculation (min) 43 min   Activity Tolerance Patient tolerated treatment well      Past Medical History  Diagnosis Date  . HLD (hyperlipidemia)   . Restless leg syndrome     well controlled on mirapex  . OSA on CPAP     sleep study 01/2014 - severe, CPAP at 10, previously on nuvigil (Dr. [name])  . Type 2 diabetes, controlled, with neuropathy 2011  . H/O hiatal hernia   . GERD (gastroesophageal reflux disease)     with sliding HH  . Migraines 03/11/12    now rare  . CVA (cerebral infarction) 03/07/12    slurred speech; right hemplegia, left handed - completed PT 07/2014 (17 sessions)  . Basal cell carcinoma of nose 2002    bridge of nose  . History of kidney problems 12/2010    lacerated left kidney after fall  . BPH with obstruction/lower urinary tract symptoms     failed flomax, uroxatral, myrbetriq - sees urology [name]) pending videourodynamics  . Depression     mild, on cymbalta per prior PCP  . Palpitations 01/2012    holter WNL, rare PAC/PVCs  . Erectile dysfunction     per urology (prostaglandin injection)  . Kidney cysts 02/2014    bilateral (renal US)  . CKD (chronic kidney disease) stage 3, GFR 30-59 ml/min 03/11/2012    Renal US 02/2014 - multiple kidney cysts   . TIA (transient ischemic attack)   . Anemia     Past Surgical History  Procedure Laterality Date  . Prostate surgery  02/2009     PVP (laser)  . Tonsillectomy and adenoidectomy  ~ 1945  . Hemorrhoid surgery  1991  . Cataract extraction w/ intraocular lens implant  11//2012 - 08/2011  . Inguinal hernia repair  1981; 2002    left, then repaired  . Skin cancer excision  2002    bridge of nose, BCC  . Colonoscopy  05/2011    normal per pt (Dr. [name], [place], [name])  . Mri brain  04/2010    WNL  . Esophagogastroduodenoscopy  2006    duodenitis, medual HH, Grade 2 reflux, mild gastritis  . Colonoscopy  03/2011    poor prep, unable to biopsy polyp in tic fold, rpt 3 mo [name])  . Abi  2007    WNL  . Urethral dilation  2014    tannenbaum  . Cystoscopy with urethral dilatation N/A 07/24/2013    Procedure: CYSTOSCOPY WITH URETHRAL DILATATION;  Surgeon: [name], MD;  Location: [facility];  Service: Urology;  Laterality: N/A;  . Transurethral incision of prostate N/A 07/24/2013    Procedure: TRANSURETHRAL INCISION OF THE PROSTATE (TUIP);  Surgeon: [name], MD;  Location: [facility];  Service: Urology;  Laterality: N/A;  . Esophagogastroduodenoscopy  07/2011    mild chronic gastritis  . Colonoscopy  06/2011    mult polyps adenomatous, severe diverticulosis, rpt 3 yrs [name])  . Carotid endarterectomy    . Cardiac catheterization  2004    normal; [place]  . Cardiovascular stress test  02/2014    WNL, low risk scan, EF 68%    Filed Vitals:   03/23/15 0858  BP: 128/74  Pulse: 79    Visit Diagnosis:  Generalized weakness  Hemiplegia affecting right side in right-dominant patient as late effect of cerebrovascular disease      Subjective Assessment - 03/23/15 0852    Subjective  I was in the hospital - something was wrong but they cleared me to come back to therapy.l   Pertinent History see epic snapshot;  pt with H/o of CVA   Patient Stated Goals I have made alot of progress but would like to be able to do even more.   Currently in Pain? No/denies                       OT Treatments/Exercises (OP) - 03/23/15 0001    Neurological Re-education Exercises   Other Exercises 1 Neuro re ed to address LUE for reducing tone/tightness in pec and internal rotaters followed by focus on scapular adduction and depression with intermittent resistance. Then addressed overhead bilateral reach in both supine and sitting in closed chain activity to about 125* shoulder flexion.  Pt given HEP to continue to address this at home. See pt instruction.                OT Education - 03/23/15 1240    Education provided Yes   Education Details upgraded HEP for UE   Person(s) Educated Patient   Methods Explanation;Demonstration;Verbal cues;Handout;Tactile cues   Comprehension Verbalized understanding;Returned demonstration          OT Short Term Goals - 03/23/15 1240    OT SHORT TERM GOAL #1   Title Pt will be mod I with HEP - 03/16/2015   Status Achieved   OT SHORT TERM GOAL #2   Title Pt will demonstrate ability to complete mid level reach to pick up light object off shelf   Status Partially Met  with mod compensations   OT SHORT TERM GOAL #3   Title Pt will report no greater than 2/10 pain in R shoulder with shoulder flexion to 130* and abduction to 100* in supine.   Status Achieved  140* shoulder flexion/105* abduction with no pain   OT SHORT TERM GOAL #4   Title Pt will demonstrate understanding of strategies to compensate for tying and zipping with AE prn   Status Achieved           OT Long Term Goals - 03/23/15 1240    OT LONG TERM GOAL #1   Title Pt will be mod I with upgraded HEP prn - 04/13/2015   Status On-going   OT LONG TERM GOAL #2   Title Pt will demonstrate ability for 90* of shoulder flexion with reach to obtain light object off shelf with min compensations   Status Revised  03/16/2015   OT LONG TERM GOAL #3   Title Pt will demonstrate improved coordination as evidenced by completing 9 hole peg in no more than 1.50 seconds  (baseline= 4  pegs)   Status Revised  pt met this goal on 03/16/2015 therefore goal revised   OT LONG TERM GOAL #4   Title Pt will be able to write name with AE prn with  R hand legibily   Status On-going   OT LONG TERM GOAL #5   Title Pt will demonstrate ability to use RUE as non dominant for basic ADL' tasks (baseline= gross assist)   Status Achieved   OT LONG TERM GOAL #6   Title Pt will be able to use RUE in cooking task as non dominant for at least 50% of the task.    Status On-going               Plan - 03/23/15 1241    Clinical Impression Statement Pt was hospitalized at the end of last week with possible diagnosis of TIA. Pt was cleared by MD to return to therapy today. Pt states he feels better but still "off". Pt able to participate today in therapy.   Pt will benefit from skilled therapeutic intervention in order to improve on the following deficits (Retired) Decreased balance;Decreased coordination;Decreased mobility;Decreased range of motion;Decreased strength;Impaired UE functional use;Impaired tone;Impaired flexibility;Pain;Difficulty walking   Rehab Potential Good   Clinical Impairments Affecting Rehab Potential Pt will benefit from skilled OT to address the above deficits.   OT Duration 8 weeks   OT Treatment/Interventions Self-care/ADL training;Moist Heat;DME and/or AE instruction;Neuromuscular education;Therapeutic exercise;Functional Mobility Training;Manual Therapy;Passive range of motion;Therapeutic activities;Balance training;Patient/family education   Plan Neuro re ed to address RUE, functional use.   Consulted and Agree with Plan of Care Patient        Problem List Patient Active Problem List   Diagnosis Date Noted  . HTN (hypertension) 03/18/2015  . Chronic diastolic heart failure, NYHA class 1 03/18/2015  . Spastic hemiparesis affecting dominant side 07/07/2014  . Right leg swelling 06/09/2014  . Left shoulder pain 03/16/2014  . Chest pressure 02/03/2014  .  Spastic hemiplegia affecting dominant side 11/07/2013  . Medicare annual wellness visit, subsequent 11/06/2013  . Erectile dysfunction due to arterial insufficiency 02/05/2013  . Depression   . Palpitations 06/26/2012  . HLD (hyperlipidemia)   . Restless leg syndrome   . GERD (gastroesophageal reflux disease)   . BPH with obstruction/lower urinary tract symptoms   . History of CVA (cerebrovascular accident) 03/11/2012  . Hemiparesis affecting right side as late effect of stroke 03/11/2012  . Type 2 diabetes, controlled, with neuropathy 03/11/2012  . OSA (obstructive sleep apnea) 03/11/2012  . CKD (chronic kidney disease) stage 3, GFR 30-59 ml/min 03/11/2012  . Migraines 03/11/2012    Quay Burow, OTR/L 03/23/2015, 12:44 PM  [facility] [address] [place], [name], [address] Phone: [phone]   Fax:  [phone]

## 2015-03-23 NOTE — Assessment & Plan Note (Signed)
To restart outpatient PT at [facility]

## 2015-03-23 NOTE — Assessment & Plan Note (Signed)
Stable. Continue cymbalta 60mg  daily and trazodone prn sleep.

## 2015-03-23 NOTE — Patient Instructions (Addendum)
Take metoprolol 25mg  1/2 tab twice daily. [facility]. Try protonix 1-2 capsules daily instead of omeprazole. I've refilled other medicines for you. Continue working with therapist. Return in 3 months for medicare wellness visit Hearing screen today.

## 2015-03-23 NOTE — Assessment & Plan Note (Signed)
Never really hypertensive, on antihypertensives for h/o CVA.

## 2015-03-23 NOTE — Telephone Encounter (Signed)
Patient here for follow up today (7/5).

## 2015-03-23 NOTE — Progress Notes (Signed)
BP 114/66 mmHg  Pulse 67  Temp(Src) 98.2 F (36.8 C) (Oral)  Wt 185 lb 12 oz (84.256 kg)  SpO2 98%   CC: hosp f/u visit  Subjective:    Patient ID: James Bell, male    DOB: [birth year], 81 y.o.   MRN: [id number]  HPI: James Bell is a 81 y.o. male presenting on 03/23/2015 for Hospitalization Follow-up   Recent hospitalization for acute R sided weakness, neurology was consulted and workup unrevealing for recurrent stroke. Deficits resolved overnight. Neurology didn't think this was TIA. Metoprolol dose was decreased. Since home has felt well. No dizziness. Energy level improving. Strength returning. Starting outpatient PT again [facility]). To go at least twice weekly.   Requests refills of valium, mirapex and metoprolol.  Requests hearing screen - noticing trouble hearing.  Recent trip to [facility] for [employer], tired him out.  Admit date: 03/18/2015 Discharge date: 03/19/2015 F/u phone call not performed.  DISCHARGE DIAGNOSES:  Principal Problem:  Acute right-sided weakness Active Problems:  History of CVA (cerebrovascular accident)  Type 2 diabetes, controlled, with neuropathy  OSA (obstructive sleep apnea)  CKD (chronic kidney disease) stage 3, GFR 30-59 ml/min  HLD (hyperlipidemia)  Restless leg syndrome  BPH with obstruction/lower urinary tract symptoms  Spastic hemiparesis affecting dominant side  HTN (hypertension)  Chronic diastolic heart failure, NYHA class 1  RECOMMENDATIONS FOR OUTPATIENT FOLLOW UP: 1. Patient to continue outpatient physical therapy 2. Dose of metoprolol has been reduced due to borderline low blood pressures.  Relevant past medical, surgical, family and social history reviewed and updated as indicated. Interim medical history since our last visit reviewed. Allergies and medications reviewed and updated. Current Outpatient Prescriptions on File Prior to Visit  Medication Sig  .  atorvastatin (LIPITOR) 40 MG tablet TAKE 1 TABLET DAILY  . Coenzyme Q10 (COQ10) 150 MG CAPS Take 1 capsule by mouth 2 (two) times daily.  . DULoxetine (CYMBALTA) 60 MG capsule TAKE 1 CAPSULE DAILY  . Folic Acid-Vit G8-QPY P95 (FOLBEE) 2.5-25-1 MG TABS tablet Take 1 tablet by mouth daily.  Marland Kitchen ibuprofen (ADVIL,MOTRIN) 200 MG tablet Take 400 mg by mouth every 6 (six) hours as needed for mild pain or moderate pain.  . Insulin Glargine (LANTUS SOLOSTAR) 100 UNIT/ML Solostar Pen Inject 60 Units into the skin at bedtime. (Patient taking differently: Inject 50 Units into the skin at bedtime. )  . losartan (COZAAR) 25 MG tablet Take 1 tablet (25 mg total) by mouth daily.  Marland Kitchen NITROSTAT 0.4 MG SL tablet DISSOLVE 1 TABLET UNDER THE TONGUE EVERY 5 MINUTES AS NEEDED FOR CHEST PAIN  . NOVOFINE 30G X 8 MM MISC USE ONE DAILY AS DIRECTED  . omega-3 acid ethyl esters (LOVAZA) 1 G capsule TAKE 2 CAPSULES (2 GRAMS TOTAL) TWICE A DAY  . sitaGLIPtin-metformin (JANUMET) 50-1000 MG per tablet Take 1 tablet by mouth 2 (two) times daily with a meal.  . traZODone (DESYREL) 50 MG tablet Take 25 mg by mouth at bedtime as needed.  . vitamin C (ASCORBIC ACID) 500 MG tablet Take 1,000 mg by mouth daily.    No current facility-administered medications on file prior to visit.    Review of Systems Per HPI unless specifically indicated above     Objective:    BP 114/66 mmHg  Pulse 67  Temp(Src) 98.2 F (36.8 C) (Oral)  Wt 185 lb 12 oz (84.256 kg)  SpO2 98%  Wt Readings from Last 3 Encounters:  03/23/15 185 lb  12 oz (84.256 kg)  03/18/15 180 lb (81.647 kg)  02/23/15 185 lb (83.915 kg)    Physical Exam  Constitutional: He appears well-developed and well-nourished. No distress.  HENT:  Head: Normocephalic and atraumatic.  Right Ear: Tympanic membrane, external ear and ear canal normal. Decreased hearing is noted.  Left Ear: Tympanic membrane, external ear and ear canal normal.  Mouth/Throat: Oropharynx is clear and  moist. No oropharyngeal exudate.  Eyes: Conjunctivae and EOM are normal. Pupils are equal, round, and reactive to light.  Neck: Normal range of motion. Neck supple.  Cardiovascular: Normal rate, regular rhythm, normal heart sounds and intact distal pulses.   No murmur heard. Pulmonary/Chest: Effort normal and breath sounds normal. No respiratory distress. He has no wheezes. He has no rales.  Musculoskeletal: He exhibits no edema.  Skin: Skin is warm and dry. No rash noted.  Psychiatric: He has a normal mood and affect.  Nursing note and vitals reviewed.  Lab Results  Component Value Date   HGBA1C 6.3* 03/19/2015      Assessment & Plan:   Problem List Items Addressed This Visit    RESOLVED: Acute right-sided weakness - Primary    Back to baseline. Unclear etiology but not felt to be TIA by neurology.      Depression    Stable. Continue cymbalta 60mg  daily and trazodone prn sleep.      Relevant Medications   diazepam (VALIUM) 5 MG tablet   GERD (gastroesophageal reflux disease)    Pt concerned about omeprazole and kidneys, memory, plavix interaction. Will change to protonix 40mg  bid prn. Pt agrees with plan.       Relevant Medications   pantoprazole (PROTONIX) 40 MG tablet   Hearing loss    Pt endorses decreased hearing. Failed hearing screen today on right at higher frequencies. Refer to audiology per pt request.       Relevant Orders   Ambulatory referral to Audiology   Hemiparesis affecting right side as late effect of stroke    To restart outpatient PT at [facility].      HTN (hypertension)    Never really hypertensive, on antihypertensives for h/o CVA.      Relevant Medications   metoprolol tartrate (LOPRESSOR) 25 MG tablet   Type 2 diabetes, controlled, with neuropathy    Chronic, stable. Has seen endo in the past. Last A1c 6.3%. Pt has decreased lantus from 60->50u nightly. Continues janumet bid.      Relevant Medications   diazepam (VALIUM) 5 MG tablet    pramipexole (MIRAPEX) 1 MG tablet       Follow up plan: Return in about 3 months (around 06/23/2015), or as needed, for medicare wellness visit.

## 2015-03-23 NOTE — Assessment & Plan Note (Signed)
Back to baseline. Unclear etiology but not felt to be TIA by neurology.

## 2015-03-23 NOTE — Patient Instructions (Signed)
Add to your home program for your arms:  1. Lay on your back with your hands behind your head and elbows pointed to the ceiling. SLOWLY stretch elbows so they are close to touching your pillow. Hold for a slow count of 5 , return to starting position. Do 10, rest then do 10 more. Do morning and night.  2.  Stand with bar behind your back. Make sure your hands are facing away from your body.  Lift your chest, squeeze your shoulder blades together and lift bar away from your body. KEEP YOUR ELBOWS STRAIGHT. Do 12, rest the do 12 more. Do 1-2 times per day.

## 2015-03-23 NOTE — Assessment & Plan Note (Signed)
Chronic, stable. Has seen endo in the past. Last A1c 6.3%. Pt has decreased lantus from 60->50u nightly. Continues janumet bid.

## 2015-03-23 NOTE — Assessment & Plan Note (Signed)
Pt concerned about omeprazole and kidneys, memory, plavix interaction. Will change to protonix 40mg  bid prn. Pt agrees with plan.

## 2015-03-25 ENCOUNTER — Ambulatory Visit: Payer: Medicare Other | Admitting: Occupational Therapy

## 2015-03-25 ENCOUNTER — Ambulatory Visit: Payer: Medicare Other | Admitting: Rehabilitative and Restorative Service Providers"

## 2015-03-25 ENCOUNTER — Encounter: Payer: Self-pay | Admitting: Occupational Therapy

## 2015-03-25 VITALS — BP 134/68 | HR 69

## 2015-03-25 DIAGNOSIS — R279 Unspecified lack of coordination: Secondary | ICD-10-CM | POA: Diagnosis not present

## 2015-03-25 DIAGNOSIS — I69951 Hemiplegia and hemiparesis following unspecified cerebrovascular disease affecting right dominant side: Secondary | ICD-10-CM | POA: Diagnosis not present

## 2015-03-25 DIAGNOSIS — R269 Unspecified abnormalities of gait and mobility: Secondary | ICD-10-CM | POA: Diagnosis not present

## 2015-03-25 DIAGNOSIS — R531 Weakness: Secondary | ICD-10-CM

## 2015-03-25 DIAGNOSIS — M6281 Muscle weakness (generalized): Secondary | ICD-10-CM | POA: Diagnosis not present

## 2015-03-25 DIAGNOSIS — I6980 Unspecified sequelae of other cerebrovascular disease: Secondary | ICD-10-CM | POA: Diagnosis not present

## 2015-03-25 DIAGNOSIS — IMO0002 Reserved for concepts with insufficient information to code with codable children: Secondary | ICD-10-CM

## 2015-03-25 NOTE — Therapy (Signed)
[facility] [address] [place], [name], [address] Phone: [phone]   Fax:  [phone]  Physical Therapy Treatment  Patient Details  Name: James Bell MRN: [id number] Date of Birth: Jan 30, 1931 Referring Provider:  [name], MD  Encounter Date: 03/25/2015      PT End of Session - 03/25/15 1223    Visit Number 3   Number of Visits 12   Date for PT Re-Evaluation 04/30/15   Authorization Type G-code required every 10th visit for Medicare.   PT Start Time 1020   PT Stop Time 1102   PT Time Calculation (min) 42 min   Equipment Utilized During Treatment Gait belt   Activity Tolerance Patient tolerated treatment well   Behavior During Therapy WFL for tasks assessed/performed      Past Medical History  Diagnosis Date  . HLD (hyperlipidemia)   . Restless leg syndrome     well controlled on mirapex  . OSA on CPAP     sleep study 01/2014 - severe, CPAP at 10, previously on nuvigil (Dr. [name])  . Type 2 diabetes, controlled, with neuropathy 2011  . H/O hiatal hernia   . GERD (gastroesophageal reflux disease)     with sliding HH  . Migraines 03/11/12    now rare  . CVA (cerebral infarction) 03/07/12    slurred speech; right hemplegia, left handed - completed PT 07/2014 (17 sessions)  . Basal cell carcinoma of nose 2002    bridge of nose  . History of kidney problems 12/2010    lacerated left kidney after fall  . BPH with obstruction/lower urinary tract symptoms     failed flomax, uroxatral, myrbetriq - sees urology [name]) pending videourodynamics  . Depression     mild, on cymbalta per prior PCP  . Palpitations 01/2012    holter WNL, rare PAC/PVCs  . Erectile dysfunction     per urology (prostaglandin injection)  . Kidney cysts 02/2014    bilateral (renal US)  . CKD (chronic kidney disease) stage 3, GFR 30-59 ml/min 03/11/2012    Renal US 02/2014 - multiple kidney cysts   . TIA  (transient ischemic attack)   . Anemia     Past Surgical History  Procedure Laterality Date  . Prostate surgery  02/2009    PVP (laser)  . Tonsillectomy and adenoidectomy  ~ 1945  . Hemorrhoid surgery  1991  . Cataract extraction w/ intraocular lens implant  11//2012 - 08/2011  . Inguinal hernia repair  1981; 2002    left, then repaired  . Skin cancer excision  2002    bridge of nose, BCC  . Colonoscopy  05/2011    normal per pt (Dr. [name], [place], [name])  . Mri brain  04/2010    WNL  . Esophagogastroduodenoscopy  2006    duodenitis, medual HH, Grade 2 reflux, mild gastritis  . Colonoscopy  03/2011    poor prep, unable to biopsy polyp in tic fold, rpt 3 mo [name])  . Abi  2007    WNL  . Urethral dilation  2014    tannenbaum  . Cystoscopy with urethral dilatation N/A 07/24/2013    Procedure: CYSTOSCOPY WITH URETHRAL DILATATION;  Surgeon: [name], MD;  Location: [facility];  Service: Urology;  Laterality: N/A;  . Transurethral incision of prostate N/A 07/24/2013    Procedure: TRANSURETHRAL INCISION OF THE PROSTATE (TUIP);  Surgeon: [name], MD;  Location: [facility];  Service: Urology;  Laterality: N/A;  . Esophagogastroduodenoscopy  07/2011    mild chronic gastritis  . Colonoscopy  06/2011    mult polyps adenomatous, severe diverticulosis, rpt 3 yrs [name])  . Carotid endarterectomy    . Cardiac catheterization  2004    normal; [facility]  . Cardiovascular stress test  02/2014    WNL, low risk scan, EF 68%    There were no vitals filed for this visit.  Visit Diagnosis:  Abnormality of gait  Generalized weakness      Subjective Assessment - 03/25/15 1023    Subjective The patient reports he has been to the hospital since initial evaluation for TIAs.  He notes R sided increasing weakness and a slower processing time (cognitively).     Currently in Pain? No/denies      Gait: Treadmill training with UE support up to 1.2 mph with cues on maintaining  wider base of support during gait tasks x 5 minutes. Ambulation without device with CGA with occasional min A for loss of balance with R UE cues for extension at elbow x 200 ft with emphasis on reciprocal pattern and wider base of support Ambulation with SBQC x 200 ft with cues on foot placement and posture  NEUROMUSCULAR RE-EDUCATION: Standing knee control/hip control focused on mild knee flexion during R leg stance with UE support Wide leg marching tasks Sidestepping x 10 ft x 2 reps each direction, then repeated with squat position at knees/hips with CGA Weight shifting anterior/posterior with min A when feet placed in stride position emphasizing postural control and posterior weight shift lifting front foot/toes Alternating foot taps to 6" step with CGA decreasing UE support       PT Short Term Goals - 03/16/15 1354    PT SHORT TERM GOAL #1   Title Pt will be independent in HEP to improve strength and functional mobility.    Baseline Target date 03/31/2015   Time 4   Period Weeks   Status On-going   PT SHORT TERM GOAL #2   Title Pt will improve Berg from 42/56 up to > or equal to 45/56 to demo decreasing risk for falls.   Baseline Target date 03/31/2015   Time 4   Period Weeks   Status On-going   PT SHORT TERM GOAL #3   Title The patient will increase gait speed from 1.85 ft/sec up to 2.2 ft/sec to demo improving gait speed for functional mobility.   Baseline Target date 03/31/2015   Time 4   Period Weeks   Status On-going   PT SHORT TERM GOAL #4   Title Pt will ambulate > or equal to 400 ft nonstop on level/unlevel surfaces with LRAD modified indep to demo improving endurance.   Baseline Target date 03/31/2015   Time 4   Period Weeks   Status On-going           PT Long Term Goals - 03/16/15 1354    PT LONG TERM GOAL #1   Title Pt will be able to verbalize understanding of falls prevention strategies to reduce risk of falls.    Baseline Target date 04/30/2015   Time 8    Period Weeks   Status On-going   PT LONG TERM GOAL #2   Title Patient will improve Berg from 42/56 up to > or equal to 49/56 to demo decreased risk for fall.   Baseline Target date 04/30/2015   Time 8   Period Weeks   Status On-going   PT LONG  TERM GOAL #3   Title The patient will increase gait speed from 1.85 ft/sec up to 2.6 ft/sec to demo improving functional mobility.   Baseline Target date 04/30/2015   Time 8   Period Weeks   Status On-going   PT LONG TERM GOAL #4   Title The patient will ambulate for 10 minutes nonstop to demo improved endurance for functional mobility.   Baseline Target date 04/30/2015   Time 8   Period Weeks   Status On-going   PT LONG TERM GOAL #5   Title Pt will verbalize understanding of HEP for post d/c activities.   Baseline Target date 04/30/2015   Time 8   Period Weeks   Status On-going   PT LONG TERM GOAL #6   Title Pt will improve ABC scale from 53.8% up to 68%.   Baseline Target date 04/30/2015   Time 8   Period Weeks   Status On-going               Plan - 03/25/15 1425    Clinical Impression Statement The patient has incresed reciprocal pattern of gait for timing and foot placement on treadmill, however he narrows base of support when fatigued possibly due to hip weakness.  Continue gait training, balance activities and LE strengthening.   PT Next Visit Plan gait on treadmill with assist progressing speed and reciprocal nature; gait emphasizing wider base; balance activities with eyes closed and R leg stance activities.   Consulted and Agree with Plan of Care Patient        Problem List Patient Active Problem List   Diagnosis Date Noted  . Hearing loss 03/23/2015  . HTN (hypertension) 03/18/2015  . Chronic diastolic heart failure, NYHA class 1 03/18/2015  . Spastic hemiparesis affecting dominant side 07/07/2014  . Right leg swelling 06/09/2014  . Left shoulder pain 03/16/2014  . Chest pressure 02/03/2014  . Spastic  hemiplegia affecting dominant side 11/07/2013  . Medicare annual wellness visit, subsequent 11/06/2013  . Erectile dysfunction due to arterial insufficiency 02/05/2013  . Depression   . Palpitations 06/26/2012  . HLD (hyperlipidemia)   . Restless leg syndrome   . GERD (gastroesophageal reflux disease)   . BPH with obstruction/lower urinary tract symptoms   . History of CVA (cerebrovascular accident) 03/11/2012  . Hemiparesis affecting right side as late effect of stroke 03/11/2012  . Type 2 diabetes, controlled, with neuropathy 03/11/2012  . OSA (obstructive sleep apnea) 03/11/2012  . CKD (chronic kidney disease) stage 3, GFR 30-59 ml/min 03/11/2012  . Migraines 03/11/2012    Arien Benincasa, PT 03/25/2015, 2:27 PM  [facility] [address] [place], [name], [address] Phone: [phone]   Fax:  [phone]

## 2015-03-25 NOTE — Therapy (Signed)
[facility] [address] [place], [name], [address] Phone: [phone]   Fax:  [phone]  Occupational Therapy Treatment  Patient Details  Name: James Bell MRN: [id number] Date of Birth: 1930-09-23 Referring Provider:  [name], MD  Encounter Date: 03/25/2015      OT End of Session - 03/25/15 1002    Visit Number 8   Number of Visits 16   Date for OT Re-Evaluation 04/13/15   Authorization Type MCR trad, tricare - G code needed   OT Start Time 0845   OT Stop Time 0931   OT Time Calculation (min) 46 min   Activity Tolerance Patient tolerated treatment well      Past Medical History  Diagnosis Date  . HLD (hyperlipidemia)   . Restless leg syndrome     well controlled on mirapex  . OSA on CPAP     sleep study 01/2014 - severe, CPAP at 10, previously on nuvigil (Dr. [name])  . Type 2 diabetes, controlled, with neuropathy 2011  . H/O hiatal hernia   . GERD (gastroesophageal reflux disease)     with sliding HH  . Migraines 03/11/12    now rare  . CVA (cerebral infarction) 03/07/12    slurred speech; right hemplegia, left handed - completed PT 07/2014 (17 sessions)  . Basal cell carcinoma of nose 2002    bridge of nose  . History of kidney problems 12/2010    lacerated left kidney after fall  . BPH with obstruction/lower urinary tract symptoms     failed flomax, uroxatral, myrbetriq - sees urology [name]) pending videourodynamics  . Depression     mild, on cymbalta per prior PCP  . Palpitations 01/2012    holter WNL, rare PAC/PVCs  . Erectile dysfunction     per urology (prostaglandin injection)  . Kidney cysts 02/2014    bilateral (renal US)  . CKD (chronic kidney disease) stage 3, GFR 30-59 ml/min 03/11/2012    Renal US 02/2014 - multiple kidney cysts   . TIA (transient ischemic attack)   . Anemia     Past Surgical History  Procedure Laterality Date  . Prostate surgery  02/2009     PVP (laser)  . Tonsillectomy and adenoidectomy  ~ 1945  . Hemorrhoid surgery  1991  . Cataract extraction w/ intraocular lens implant  11//2012 - 08/2011  . Inguinal hernia repair  1981; 2002    left, then repaired  . Skin cancer excision  2002    bridge of nose, BCC  . Colonoscopy  05/2011    normal per pt (Dr. [name], [place], [name])  . Mri brain  04/2010    WNL  . Esophagogastroduodenoscopy  2006    duodenitis, medual HH, Grade 2 reflux, mild gastritis  . Colonoscopy  03/2011    poor prep, unable to biopsy polyp in tic fold, rpt 3 mo [name])  . Abi  2007    WNL  . Urethral dilation  2014    tannenbaum  . Cystoscopy with urethral dilatation N/A 07/24/2013    Procedure: CYSTOSCOPY WITH URETHRAL DILATATION;  Surgeon: [name], MD;  Location: [facility];  Service: Urology;  Laterality: N/A;  . Transurethral incision of prostate N/A 07/24/2013    Procedure: TRANSURETHRAL INCISION OF THE PROSTATE (TUIP);  Surgeon: [name], MD;  Location: [facility];  Service: Urology;  Laterality: N/A;  . Esophagogastroduodenoscopy  07/2011    mild chronic gastritis  . Colonoscopy  06/2011    mult polyps adenomatous, severe diverticulosis, rpt 3 yrs [name])  . Carotid endarterectomy    . Cardiac catheterization  2004    normal; [facility]  . Cardiovascular stress test  02/2014    WNL, low risk scan, EF 68%    Filed Vitals:   03/25/15 0858  BP: 134/68  Pulse: 69    Visit Diagnosis:  Hemiplegia affecting right side in right-dominant patient as late effect of cerebrovascular disease  Muscle weakness  Lack of coordination due to stroke      Subjective Assessment - 03/25/15 0855    Subjective  I am very unsteady this morning   Pertinent History see epic snapshot;  pt with H/o of CVA   Patient Stated Goals I have made alot of progress but would like to be able to do even more.   Currently in Pain? No/denies                      OT Treatments/Exercises  (OP) - 03/25/15 0001    ADLs   Cooking Utilized cooking activity to address incorporation of RUE into functional tasks of meal prep, cooking and clean up.  Pt required min vc's to use RUE in various activities. Pt is able to recognize that he now has the ability to use the RUE more in functional tasks but has developed the habit of using LUE and will need to actively work on incorporating this.  Pt able to use RUE as non dominant for greater than 50% of the task.  Discussed several other tasks in which pt could incorporate RUE.  Pt is also not hydrating enough which is impacting functional mobility (see chart review ) - pt unsteady upon arriving here today. After hydrating pt more stable on feet reported he felt better. Discussed strategies for pt to use as reminders to drink through out the day as pt states "I just forget."                  OT Short Term Goals - 03/25/15 0959    OT SHORT TERM GOAL #1   Title Pt will be mod I with HEP - 03/16/2015   Status Achieved   OT SHORT TERM GOAL #2   Title Pt will demonstrate ability to complete mid level reach to pick up light object off shelf   Status Partially Met  with mod compensations   OT SHORT TERM GOAL #3   Title Pt will report no greater than 2/10 pain in R shoulder with shoulder flexion to 130* and abduction to 100* in supine.   Status Achieved  140* shoulder flexion/105* abduction with no pain   OT SHORT TERM GOAL #4   Title Pt will demonstrate understanding of strategies to compensate for tying and zipping with AE prn   Status Achieved           OT Long Term Goals - 03/25/15 0959    OT LONG TERM GOAL #1   Title Pt will be mod I with upgraded HEP prn - 04/13/2015   Status On-going   OT LONG TERM GOAL #2   Title Pt will demonstrate ability for 90* of shoulder flexion with reach to obtain light object off shelf with min compensations   Status Revised  03/16/2015   OT LONG TERM GOAL #3   Title Pt will demonstrate improved  coordination as evidenced by completing 9 hole peg in no more than 1.50 seconds  (baseline= 4  pegs)   Status Revised  pt met this goal on 03/16/2015 therefore goal revised   OT LONG TERM GOAL #4   Title Pt will be able to write name with AE prn with R hand legibily   Status On-going   OT LONG TERM GOAL #5   Title Pt will demonstrate ability to use RUE as non dominant for basic ADL' tasks (baseline= gross assist)   Status Achieved   OT LONG TERM GOAL #6   Title Pt will be able to use RUE in cooking task as non dominant for at least 50% of the task.    Status Achieved               Plan - 03/25/15 1000    Clinical Impression Statement Pt making good progress toward goals and able to use RUE much more functionally. Pt requires reminders to hydrate - see treatment section.   Pt will benefit from skilled therapeutic intervention in order to improve on the following deficits (Retired) Decreased balance;Decreased coordination;Decreased mobility;Decreased range of motion;Decreased strength;Impaired UE functional use;Impaired tone;Impaired flexibility;Pain;Difficulty walking   Rehab Potential Good   Clinical Impairments Affecting Rehab Potential Pt will benefit from skilled OT to address the above deficits.   OT Frequency 2x / week   OT Duration 8 weeks   OT Treatment/Interventions Self-care/ADL training;Moist Heat;DME and/or AE instruction;Neuromuscular education;Therapeutic exercise;Functional Mobility Training;Manual Therapy;Passive range of motion;Therapeutic activities;Balance training;Patient/family education   Plan work on mid reach, coordination and Medical sales representative and Agree with Plan of Care Patient        Problem List Patient Active Problem List   Diagnosis Date Noted  . Hearing loss 03/23/2015  . HTN (hypertension) 03/18/2015  . Chronic diastolic heart failure, NYHA class 1 03/18/2015  . Spastic hemiparesis affecting dominant side 07/07/2014  . Right leg swelling  06/09/2014  . Left shoulder pain 03/16/2014  . Chest pressure 02/03/2014  . Spastic hemiplegia affecting dominant side 11/07/2013  . Medicare annual wellness visit, subsequent 11/06/2013  . Erectile dysfunction due to arterial insufficiency 02/05/2013  . Depression   . Palpitations 06/26/2012  . HLD (hyperlipidemia)   . Restless leg syndrome   . GERD (gastroesophageal reflux disease)   . BPH with obstruction/lower urinary tract symptoms   . History of CVA (cerebrovascular accident) 03/11/2012  . Hemiparesis affecting right side as late effect of stroke 03/11/2012  . Type 2 diabetes, controlled, with neuropathy 03/11/2012  . OSA (obstructive sleep apnea) 03/11/2012  . CKD (chronic kidney disease) stage 3, GFR 30-59 ml/min 03/11/2012  . Migraines 03/11/2012    Quay Burow, OTR/L 03/25/2015, 10:03 AM  [facility] [address] [place], [name], [address] Phone: [phone]   Fax:  [phone]

## 2015-03-30 ENCOUNTER — Encounter: Payer: Self-pay | Admitting: Primary Care

## 2015-03-30 ENCOUNTER — Ambulatory Visit (INDEPENDENT_AMBULATORY_CARE_PROVIDER_SITE_OTHER): Payer: Medicare Other | Admitting: Primary Care

## 2015-03-30 ENCOUNTER — Ambulatory Visit: Payer: Medicare Other | Admitting: Occupational Therapy

## 2015-03-30 ENCOUNTER — Ambulatory Visit: Payer: Medicare Other | Admitting: Rehabilitative and Restorative Service Providers"

## 2015-03-30 VITALS — BP 126/70 | HR 64 | Temp 97.4°F | Wt 184.4 lb

## 2015-03-30 DIAGNOSIS — R531 Weakness: Secondary | ICD-10-CM

## 2015-03-30 DIAGNOSIS — R269 Unspecified abnormalities of gait and mobility: Secondary | ICD-10-CM

## 2015-03-30 DIAGNOSIS — M6281 Muscle weakness (generalized): Secondary | ICD-10-CM | POA: Diagnosis not present

## 2015-03-30 DIAGNOSIS — I6980 Unspecified sequelae of other cerebrovascular disease: Secondary | ICD-10-CM | POA: Diagnosis not present

## 2015-03-30 DIAGNOSIS — R279 Unspecified lack of coordination: Secondary | ICD-10-CM | POA: Diagnosis not present

## 2015-03-30 DIAGNOSIS — M25511 Pain in right shoulder: Secondary | ICD-10-CM

## 2015-03-30 DIAGNOSIS — R05 Cough: Secondary | ICD-10-CM

## 2015-03-30 DIAGNOSIS — I639 Cerebral infarction, unspecified: Secondary | ICD-10-CM

## 2015-03-30 DIAGNOSIS — R059 Cough, unspecified: Secondary | ICD-10-CM

## 2015-03-30 DIAGNOSIS — I69951 Hemiplegia and hemiparesis following unspecified cerebrovascular disease affecting right dominant side: Secondary | ICD-10-CM

## 2015-03-30 DIAGNOSIS — IMO0002 Reserved for concepts with insufficient information to code with codable children: Secondary | ICD-10-CM

## 2015-03-30 MED ORDER — HYDROCOD POLST-CPM POLST ER 10-8 MG/5ML PO SUER
5.0000 mL | Freq: Every evening | ORAL | Status: DC | PRN
Start: 2015-03-30 — End: 2015-06-23

## 2015-03-30 MED ORDER — DOXYCYCLINE HYCLATE 100 MG PO TABS: 100.0000 mg | ORAL_TABLET | Freq: Two times a day (BID) | ORAL | Status: DC

## 2015-03-30 NOTE — Patient Instructions (Signed)
Start Doxycycline antibiotic. Take 1 tablet by mouth twice daily for 10 days.  You may take the Tussionex at bedtime for cough.  Please call me if you do not experience a resolve in your symptoms in the next week.  It was nice to meet you!

## 2015-03-30 NOTE — Therapy (Signed)
[facility] [address] [place], [name], [address] Phone: [phone]   Fax:  [phone]  Occupational Therapy Treatment  Patient Details  Name: James Bell MRN: [id number] Date of Birth: 1930/09/25 Referring Provider:  [name], MD  Encounter Date: 03/30/2015      OT End of Session - 03/30/15 0943    Visit Number 9   Number of Visits 16   Date for OT Re-Evaluation 04/13/15   Authorization Type MCR trad, tricare - G code needed   Authorization Time Period G code next visit   Authorization - Visit Number 9   Authorization - Number of Visits 10   OT Start Time 0933   OT Stop Time 1015   OT Time Calculation (min) 42 min   Activity Tolerance Patient tolerated treatment well   Behavior During Therapy [facility] for tasks assessed/performed      Past Medical History  Diagnosis Date  . HLD (hyperlipidemia)   . Restless leg syndrome     well controlled on mirapex  . OSA on CPAP     sleep study 01/2014 - severe, CPAP at 10, previously on nuvigil (Dr. [name])  . Type 2 diabetes, controlled, with neuropathy 2011  . H/O hiatal hernia   . GERD (gastroesophageal reflux disease)     with sliding HH  . Migraines 03/11/12    now rare  . CVA (cerebral infarction) 03/07/12    slurred speech; right hemplegia, left handed - completed PT 07/2014 (17 sessions)  . Basal cell carcinoma of nose 2002    bridge of nose  . History of kidney problems 12/2010    lacerated left kidney after fall  . BPH with obstruction/lower urinary tract symptoms     failed flomax, uroxatral, myrbetriq - sees urology [name]) pending videourodynamics  . Depression     mild, on cymbalta per prior PCP  . Palpitations 01/2012    holter WNL, rare PAC/PVCs  . Erectile dysfunction     per urology (prostaglandin injection)  . Kidney cysts 02/2014    bilateral (renal US)  . CKD (chronic kidney disease) stage 3, GFR 30-59 ml/min  03/11/2012    Renal US 02/2014 - multiple kidney cysts   . TIA (transient ischemic attack)   . Anemia     Past Surgical History  Procedure Laterality Date  . Prostate surgery  02/2009    PVP (laser)  . Tonsillectomy and adenoidectomy  ~ 1945  . Hemorrhoid surgery  1991  . Cataract extraction w/ intraocular lens implant  11//2012 - 08/2011  . Inguinal hernia repair  1981; 2002    left, then repaired  . Skin cancer excision  2002    bridge of nose, BCC  . Colonoscopy  05/2011    normal per pt (Dr. [name], [place], [name])  . Mri brain  04/2010    WNL  . Esophagogastroduodenoscopy  2006    duodenitis, medual HH, Grade 2 reflux, mild gastritis  . Colonoscopy  03/2011    poor prep, unable to biopsy polyp in tic fold, rpt 3 mo [name])  . Abi  2007    WNL  . Urethral dilation  2014    tannenbaum  . Cystoscopy with urethral dilatation N/A 07/24/2013    Procedure: CYSTOSCOPY WITH URETHRAL DILATATION;  Surgeon: [name], MD;  Location: [facility];  Service: Urology;  Laterality: N/A;  . Transurethral incision of prostate N/A 07/24/2013    Procedure: TRANSURETHRAL INCISION OF THE  PROSTATE (TUIP);  Surgeon: [name], MD;  Location: [facility];  Service: Urology;  Laterality: N/A;  . Esophagogastroduodenoscopy  07/2011    mild chronic gastritis  . Colonoscopy  06/2011    mult polyps adenomatous, severe diverticulosis, rpt 3 yrs [name])  . Carotid endarterectomy    . Cardiac catheterization  2004    normal; [facility]  . Cardiovascular stress test  02/2014    WNL, low risk scan, EF 68%    There were no vitals filed for this visit.  Visit Diagnosis:  Generalized weakness  Hemiplegia affecting right side in right-dominant patient as late effect of cerebrovascular disease  Muscle weakness  Lack of coordination due to stroke  Pain in joint, shoulder region, right      Subjective Assessment - 03/30/15 0932    Subjective  Denies pain and denies changes to meds    Pertinent History see epic snapshot;  pt with H/o of CVA   Patient Stated Goals I have made alot of progress but would like to be able to do even more.   Currently in Pain? No/denies      Treatment:  Self care: Handwriting activities, for increased hand eye/ coordination, tracing circles with RUE, and tracing letters of alphabet with mod difficulty using a large highlighter, and towel under elbow, min v.c. Pt was provided with a foam grip for pen at home.Pt inquired about clipping fingernails of LUE, pt was provided with info regarding nail clippers to suction cup to talble. Pt was encouraged to exercise caution to avoid cutting nails to short and to avoid injury to hand. Pt reports not feeling well today after not sleeping well last night.  Theraputic activities:Standing for mid level functional reaching to place/ remove large pegs form pegboard, min-mod difficulty, min v.c. for RUE positioning.                           OT Short Term Goals - 03/25/15 0959    OT SHORT TERM GOAL #1   Title Pt will be mod I with HEP - 03/16/2015   Status Achieved   OT SHORT TERM GOAL #2   Title Pt will demonstrate ability to complete mid level reach to pick up light object off shelf   Status Partially Met  with mod compensations   OT SHORT TERM GOAL #3   Title Pt will report no greater than 2/10 pain in R shoulder with shoulder flexion to 130* and abduction to 100* in supine.   Status Achieved  140* shoulder flexion/105* abduction with no pain   OT SHORT TERM GOAL #4   Title Pt will demonstrate understanding of strategies to compensate for tying and zipping with AE prn   Status Achieved           OT Long Term Goals - 03/25/15 0959    OT LONG TERM GOAL #1   Title Pt will be mod I with upgraded HEP prn - 04/13/2015   Status On-going   OT LONG TERM GOAL #2   Title Pt will demonstrate ability for 90* of shoulder flexion with reach to obtain light object off shelf with min  compensations   Status Revised  03/16/2015   OT LONG TERM GOAL #3   Title Pt will demonstrate improved coordination as evidenced by completing 9 hole peg in no more than 1.50 seconds  (baseline= 4 pegs)   Status Revised  pt met this goal on 03/16/2015 therefore  goal revised   OT LONG TERM GOAL #4   Title Pt will be able to write name with AE prn with R hand legibily   Status On-going   OT LONG TERM GOAL #5   Title Pt will demonstrate ability to use RUE as non dominant for basic ADL' tasks (baseline= gross assist)   Status Achieved   OT LONG TERM GOAL #6   Title Pt will be able to use RUE in cooking task as non dominant for at least 50% of the task.    Status Achieved               Problem List Patient Active Problem List   Diagnosis Date Noted  . Hearing loss 03/23/2015  . HTN (hypertension) 03/18/2015  . Chronic diastolic heart failure, NYHA class 1 03/18/2015  . Spastic hemiparesis affecting dominant side 07/07/2014  . Right leg swelling 06/09/2014  . Left shoulder pain 03/16/2014  . Chest pressure 02/03/2014  . Spastic hemiplegia affecting dominant side 11/07/2013  . Medicare annual wellness visit, subsequent 11/06/2013  . Erectile dysfunction due to arterial insufficiency 02/05/2013  . Depression   . Palpitations 06/26/2012  . HLD (hyperlipidemia)   . Restless leg syndrome   . GERD (gastroesophageal reflux disease)   . BPH with obstruction/lower urinary tract symptoms   . History of CVA (cerebrovascular accident) 03/11/2012  . Hemiparesis affecting right side as late effect of stroke 03/11/2012  . Type 2 diabetes, controlled, with neuropathy 03/11/2012  . OSA (obstructive sleep apnea) 03/11/2012  . CKD (chronic kidney disease) stage 3, GFR 30-59 ml/min 03/11/2012  . Migraines 03/11/2012    Leslee Suire 03/30/2015, 9:45 AM Theone Murdoch, OTR/L [phone] Phone: [phone] 9:45 AM 03/30/2015 [facility] [address] [place], [name], [address] Phone: [phone]   Fax:  [phone]

## 2015-03-30 NOTE — Progress Notes (Signed)
Subjective:    Patient ID: James Bell, male    DOB: Nov 02, 1930, 81 y.o.   MRN: [id number]  HPI  James Bell is an 81 year old male who presents today with a chief complaint of fullness and dryness to his throat with cough that began 5 days ago. Over the past 2 days his cough has become productive with a creamy/yellow color. Overall he's feeling tired and with decrease in energy. He had a tooth pulled and was placed on Amoxicillin 2 weeks ago. He completed his course of amoxicillin.   Review of Systems  Constitutional: Positive for activity change. Negative for fever and chills.  Respiratory: Positive for cough. Negative for shortness of breath.   Cardiovascular: Negative for chest pain.  Gastrointestinal: Positive for nausea. Negative for vomiting.       Past Medical History  Diagnosis Date  . HLD (hyperlipidemia)   . Restless leg syndrome     well controlled on mirapex  . OSA on CPAP     sleep study 01/2014 - severe, CPAP at 10, previously on nuvigil (Dr. [name])  . Type 2 diabetes, controlled, with neuropathy 2011  . H/O hiatal hernia   . GERD (gastroesophageal reflux disease)     with sliding HH  . Migraines 03/11/12    now rare  . CVA (cerebral infarction) 03/07/12    slurred speech; right hemplegia, left handed - completed PT 07/2014 (17 sessions)  . Basal cell carcinoma of nose 2002    bridge of nose  . History of kidney problems 12/2010    lacerated left kidney after fall  . BPH with obstruction/lower urinary tract symptoms     failed flomax, uroxatral, myrbetriq - sees urology [name]) pending videourodynamics  . Depression     mild, on cymbalta per prior PCP  . Palpitations 01/2012    holter WNL, rare PAC/PVCs  . Erectile dysfunction     per urology (prostaglandin injection)  . Kidney cysts 02/2014    bilateral (renal US)  . CKD (chronic kidney disease) stage 3, GFR 30-59 ml/min 03/11/2012    Renal US 02/2014 - multiple kidney cysts   . TIA  (transient ischemic attack)   . Anemia     History   Social History  . Marital Status: Unknown    Spouse Name: N/A  . Number of Children: 2  . Years of Education: MD   Occupational History  . retired   .     Social History Main Topics  . Smoking status: Never Smoker   . Smokeless tobacco: Never Used     Comment: "used to smoke pipe when I was younger"  . Alcohol Use: 2.4 oz/week    2 Glasses of wine, 2 Shots of liquor per week     Comment: occasional  . Drug Use: No  . Sexual Activity: Yes   Other Topics Concern  . Not on file   Social History Narrative   Caffeine: 1 cup coffee/day   Divorced   Occupation: ophthalmologist   Edu: MD   Activity: rehab s/p CVA   Diet: some water, fruits/vegetables daily   POA - [name] (Daughter)    Past Surgical History  Procedure Laterality Date  . Prostate surgery  02/2009    PVP (laser)  . Tonsillectomy and adenoidectomy  ~ 1945  . Hemorrhoid surgery  1991  . Cataract extraction w/ intraocular lens implant  11//2012 - 08/2011  . Inguinal hernia repair  1981; 2002  left, then repaired  . Skin cancer excision  2002    bridge of nose, BCC  . Colonoscopy  05/2011    normal per pt (Dr. [name], [place], [name])  . Mri brain  04/2010    WNL  . Esophagogastroduodenoscopy  2006    duodenitis, medual HH, Grade 2 reflux, mild gastritis  . Colonoscopy  03/2011    poor prep, unable to biopsy polyp in tic fold, rpt 3 mo [name])  . Abi  2007    WNL  . Urethral dilation  2014    tannenbaum  . Cystoscopy with urethral dilatation N/A 07/24/2013    Procedure: CYSTOSCOPY WITH URETHRAL DILATATION;  Surgeon: [name], MD;  Location: [facility];  Service: Urology;  Laterality: N/A;  . Transurethral incision of prostate N/A 07/24/2013    Procedure: TRANSURETHRAL INCISION OF THE PROSTATE (TUIP);  Surgeon: [name], MD;  Location: [facility];  Service: Urology;  Laterality: N/A;  . Esophagogastroduodenoscopy  07/2011     mild chronic gastritis  . Colonoscopy  06/2011    mult polyps adenomatous, severe diverticulosis, rpt 3 yrs [name])  . Carotid endarterectomy    . Cardiac catheterization  2004    normal; [facility]  . Cardiovascular stress test  02/2014    WNL, low risk scan, EF 68%    Family History  Problem Relation Age of Onset  . Stroke Father   . Lung cancer Father 48     smoker  . Stroke Brother   . Diabetes Brother 88  . Coronary artery disease Neg Hx   . Lung cancer Brother   . Alcoholism Sister   . Kidney disease Sister     Allergies  Allergen Reactions  . Lisinopril Cough    tachycardia  . Nexium [Esomeprazole] Other (See Comments)    Headache  . Niacin And Related Other (See Comments)    flushing    Current Outpatient Prescriptions on File Prior to Visit  Medication Sig Dispense Refill  . atorvastatin (LIPITOR) 40 MG tablet TAKE 1 TABLET DAILY 90 tablet 1  . clopidogrel (PLAVIX) 75 MG tablet TAKE 1 TABLET DAILY 90 tablet 3  . Coenzyme Q10 (COQ10) 150 MG CAPS Take 1 capsule by mouth 2 (two) times daily.    . diazepam (VALIUM) 5 MG tablet Take 1 tablet (5 mg total) by mouth every 8 (eight) hours as needed for muscle spasms. 30 tablet 3  . DULoxetine (CYMBALTA) 60 MG capsule TAKE 1 CAPSULE DAILY 90 capsule 2  . Folic Acid-Vit Z6-WFU X32 (FOLBEE) 2.5-25-1 MG TABS tablet Take 1 tablet by mouth daily.    Marland Kitchen ibuprofen (ADVIL,MOTRIN) 200 MG tablet Take 400 mg by mouth every 6 (six) hours as needed for mild pain or moderate pain.    . Insulin Glargine (LANTUS SOLOSTAR) 100 UNIT/ML Solostar Pen Inject 60 Units into the skin at bedtime. (Patient taking differently: Inject 50 Units into the skin at bedtime. ) 3 pen 2  . losartan (COZAAR) 25 MG tablet Take 1 tablet (25 mg total) by mouth daily. 90 tablet 3  . metoprolol tartrate (LOPRESSOR) 25 MG tablet Take 0.5 tablets (12.5 mg total) by mouth 2 (two) times daily. 90 tablet 3  . NOVOFINE 30G X 8 MM MISC USE ONE DAILY AS DIRECTED 100 each 2    . omega-3 acid ethyl esters (LOVAZA) 1 G capsule TAKE 2 CAPSULES (2 GRAMS TOTAL) TWICE A DAY 360 capsule 1  . pantoprazole (PROTONIX) 40 MG tablet Take  1 tablet (40 mg total) by mouth 2 (two) times daily as needed. 60 tablet 1  . pramipexole (MIRAPEX) 1 MG tablet TAKE 1 TABLET AT BEDTIME 90 tablet 3  . sitaGLIPtin-metformin (JANUMET) 50-1000 MG per tablet Take 1 tablet by mouth 2 (two) times daily with a meal.    . traZODone (DESYREL) 50 MG tablet Take 25 mg by mouth at bedtime as needed.    . vitamin C (ASCORBIC ACID) 500 MG tablet Take 1,000 mg by mouth daily.     Marland Kitchen NITROSTAT 0.4 MG SL tablet DISSOLVE 1 TABLET UNDER THE TONGUE EVERY 5 MINUTES AS NEEDED FOR CHEST PAIN (Patient not taking: Reported on 03/30/2015) 30 tablet 0   No current facility-administered medications on file prior to visit.    BP 126/70 mmHg  Pulse 64  Temp(Src) 97.4 F (36.3 C) (Oral)  Wt 184 lb 6.4 oz (83.643 kg)  SpO2 96%    Objective:   Physical Exam  Constitutional: He appears well-nourished. He does not appear ill.  HENT:  Right Ear: Tympanic membrane and ear canal normal.  Left Ear: Tympanic membrane and ear canal normal.  Nose: Right sinus exhibits no maxillary sinus tenderness and no frontal sinus tenderness. Left sinus exhibits no maxillary sinus tenderness and no frontal sinus tenderness.  Mouth/Throat: Oropharynx is clear and moist.  Eyes: Conjunctivae are normal. Pupils are equal, round, and reactive to light.  Cardiovascular: Normal rate and regular rhythm.   Pulmonary/Chest: Effort normal and breath sounds normal. He has no decreased breath sounds. He has no rhonchi.  Lymphadenopathy:    He has cervical adenopathy.  Skin: Skin is warm and dry.          Assessment & Plan:  Sore throat with cough:  Present for 5 days, worsening now with production of yellow sputum. Decline in energy, limited sleep at night. Lungs clear on exam. No diminished sounds. +cervicle adenopathy. Due to age,  co-morbidities and recent hospitalization, will treat with Doxycycline. RX for Tussionex at bedtime (which he has taken in the past) for cough. Follow up if no improvement in symptoms.

## 2015-03-30 NOTE — Therapy (Signed)
[facility] [address] [place], [name], [address] Phone: [phone]   Fax:  [phone]  Physical Therapy Treatment  Patient Details  Name: James Bell MRN: [id number] Date of Birth: Mar 10, 1931 Referring Provider:  [name], MD  Encounter Date: 03/30/2015      PT End of Session - 03/30/15 0858    Visit Number 4   Number of Visits 12   Date for PT Re-Evaluation 04/30/15   Authorization Type G-code required every 10th visit for Medicare.   PT Start Time [phone]   PT Stop Time 0930   PT Time Calculation (min) 40 min   Equipment Utilized During Treatment Gait belt   Activity Tolerance Patient tolerated treatment well   Behavior During Therapy WFL for tasks assessed/performed      Past Medical History  Diagnosis Date  . HLD (hyperlipidemia)   . Restless leg syndrome     well controlled on mirapex  . OSA on CPAP     sleep study 01/2014 - severe, CPAP at 10, previously on nuvigil (Dr. [name])  . Type 2 diabetes, controlled, with neuropathy 2011  . H/O hiatal hernia   . GERD (gastroesophageal reflux disease)     with sliding HH  . Migraines 03/11/12    now rare  . CVA (cerebral infarction) 03/07/12    slurred speech; right hemplegia, left handed - completed PT 07/2014 (17 sessions)  . Basal cell carcinoma of nose 2002    bridge of nose  . History of kidney problems 12/2010    lacerated left kidney after fall  . BPH with obstruction/lower urinary tract symptoms     failed flomax, uroxatral, myrbetriq - sees urology [name]) pending videourodynamics  . Depression     mild, on cymbalta per prior PCP  . Palpitations 01/2012    holter WNL, rare PAC/PVCs  . Erectile dysfunction     per urology (prostaglandin injection)  . Kidney cysts 02/2014    bilateral (renal US)  . CKD (chronic kidney disease) stage 3, GFR 30-59 ml/min 03/11/2012    Renal US 02/2014 - multiple kidney cysts   . TIA  (transient ischemic attack)   . Anemia     Past Surgical History  Procedure Laterality Date  . Prostate surgery  02/2009    PVP (laser)  . Tonsillectomy and adenoidectomy  ~ 1945  . Hemorrhoid surgery  1991  . Cataract extraction w/ intraocular lens implant  11//2012 - 08/2011  . Inguinal hernia repair  1981; 2002    left, then repaired  . Skin cancer excision  2002    bridge of nose, BCC  . Colonoscopy  05/2011    normal per pt (Dr. [name], [place], [name])  . Mri brain  04/2010    WNL  . Esophagogastroduodenoscopy  2006    duodenitis, medual HH, Grade 2 reflux, mild gastritis  . Colonoscopy  03/2011    poor prep, unable to biopsy polyp in tic fold, rpt 3 mo [name])  . Abi  2007    WNL  . Urethral dilation  2014    tannenbaum  . Cystoscopy with urethral dilatation N/A 07/24/2013    Procedure: CYSTOSCOPY WITH URETHRAL DILATATION;  Surgeon: [name], MD;  Location: [facility];  Service: Urology;  Laterality: N/A;  . Transurethral incision of prostate N/A 07/24/2013    Procedure: TRANSURETHRAL INCISION OF THE PROSTATE (TUIP);  Surgeon: [name], MD;  Location: [facility];  Service: Urology;  Laterality: N/A;  . Esophagogastroduodenoscopy  07/2011    mild chronic gastritis  . Colonoscopy  06/2011    mult polyps adenomatous, severe diverticulosis, rpt 3 yrs [name])  . Carotid endarterectomy    . Cardiac catheterization  2004    normal; [facility]  . Cardiovascular stress test  02/2014    WNL, low risk scan, EF 68%    There were no vitals filed for this visit.  Visit Diagnosis:  Abnormality of gait  Generalized weakness      Subjective Assessment - 03/30/15 0854    Subjective The patient reports his taste and smell feel diminished after this "last episode".        BP=135/58 at start of treatment    Gait: Treadmill training with UE support up to 1.2 mph with cues on maintaining wider base of support during gait tasks x 5 minutes and also increasing  incline 1-2% during task. Ambulation with SBQC x 200 ft with cues on foot placement and posture, 120 ft x 2 reps  NEUROMUSCULAR RE-EDUCATION: Wide leg marching tasks with CGA Sidestepping over object 2" high each direction, then repeated with squat position at knees/hips with CGA Alternating foot taps to 6" step with CGA with UE support Alternating foot taps to 12" surface with focus on wider base with UE support and tactile cues R knee  THERAPEUTIC EXERCISE: L sidelying, performing clam shells R hip abduction and assisted R hip abduction, hip hike/depression with tactile cues Step ups        PT Short Term Goals - 03/16/15 1354    PT SHORT TERM GOAL #1   Title Pt will be independent in HEP to improve strength and functional mobility.    Baseline Target date 03/31/2015   Time 4   Period Weeks   Status On-going   PT SHORT TERM GOAL #2   Title Pt will improve Berg from 42/56 up to > or equal to 45/56 to demo decreasing risk for falls.   Baseline Target date 03/31/2015   Time 4   Period Weeks   Status On-going   PT SHORT TERM GOAL #3   Title The patient will increase gait speed from 1.85 ft/sec up to 2.2 ft/sec to demo improving gait speed for functional mobility.   Baseline Target date 03/31/2015   Time 4   Period Weeks   Status On-going   PT SHORT TERM GOAL #4   Title Pt will ambulate > or equal to 400 ft nonstop on level/unlevel surfaces with LRAD modified indep to demo improving endurance.   Baseline Target date 03/31/2015   Time 4   Period Weeks   Status On-going           PT Long Term Goals - 03/16/15 1354    PT LONG TERM GOAL #1   Title Pt will be able to verbalize understanding of falls prevention strategies to reduce risk of falls.    Baseline Target date 04/30/2015   Time 8   Period Weeks   Status On-going   PT LONG TERM GOAL #2   Title Patient will improve Berg from 42/56 up to > or equal to 49/56 to demo decreased risk for fall.   Baseline Target date  04/30/2015   Time 8   Period Weeks   Status On-going   PT LONG TERM GOAL #3   Title The patient will increase gait speed from 1.85 ft/sec up to 2.6 ft/sec to demo improving functional mobility.   Baseline Target date  04/30/2015   Time 8   Period Weeks   Status On-going   PT LONG TERM GOAL #4   Title The patient will ambulate for 10 minutes nonstop to demo improved endurance for functional mobility.   Baseline Target date 04/30/2015   Time 8   Period Weeks   Status On-going   PT LONG TERM GOAL #5   Title Pt will verbalize understanding of HEP for post d/c activities.   Baseline Target date 04/30/2015   Time 8   Period Weeks   Status On-going   PT LONG TERM GOAL #6   Title Pt will improve ABC scale from 53.8% up to 68%.   Baseline Target date 04/30/2015   Time 8   Period Weeks   Status On-going               Plan - 03/30/15 5681    Clinical Impression Statement PT focused on hip strengthening/stability to widen base of support during gait activities.  Check STGs at next visit.  Continue towards current treatment plan/goals.   PT Next Visit Plan check STGs;  gait training, balance, hip stabilization   Consulted and Agree with Plan of Care Patient        Problem List Patient Active Problem List   Diagnosis Date Noted  . Hearing loss 03/23/2015  . HTN (hypertension) 03/18/2015  . Chronic diastolic heart failure, NYHA class 1 03/18/2015  . Spastic hemiparesis affecting dominant side 07/07/2014  . Right leg swelling 06/09/2014  . Left shoulder pain 03/16/2014  . Chest pressure 02/03/2014  . Spastic hemiplegia affecting dominant side 11/07/2013  . Medicare annual wellness visit, subsequent 11/06/2013  . Erectile dysfunction due to arterial insufficiency 02/05/2013  . Depression   . Palpitations 06/26/2012  . HLD (hyperlipidemia)   . Restless leg syndrome   . GERD (gastroesophageal reflux disease)   . BPH with obstruction/lower urinary tract symptoms   . History of  CVA (cerebrovascular accident) 03/11/2012  . Hemiparesis affecting right side as late effect of stroke 03/11/2012  . Type 2 diabetes, controlled, with neuropathy 03/11/2012  . OSA (obstructive sleep apnea) 03/11/2012  . CKD (chronic kidney disease) stage 3, GFR 30-59 ml/min 03/11/2012  . Migraines 03/11/2012    Kieren Adkison, PT 03/30/2015, 2:13 PM  [facility] [address] [place], [name], [address] Phone: [phone]   Fax:  [phone]

## 2015-03-30 NOTE — Progress Notes (Signed)
Pre visit review using our clinic review tool, if applicable. No additional management support is needed unless otherwise documented below in the visit note. 

## 2015-04-01 ENCOUNTER — Ambulatory Visit: Payer: Medicare Other | Admitting: Rehabilitative and Restorative Service Providers"

## 2015-04-06 ENCOUNTER — Ambulatory Visit: Payer: Medicare Other | Admitting: Rehabilitative and Restorative Service Providers"

## 2015-04-06 ENCOUNTER — Encounter: Payer: Self-pay | Admitting: Occupational Therapy

## 2015-04-06 ENCOUNTER — Ambulatory Visit: Payer: Medicare Other | Admitting: Occupational Therapy

## 2015-04-06 DIAGNOSIS — I69951 Hemiplegia and hemiparesis following unspecified cerebrovascular disease affecting right dominant side: Secondary | ICD-10-CM

## 2015-04-06 DIAGNOSIS — R269 Unspecified abnormalities of gait and mobility: Secondary | ICD-10-CM | POA: Diagnosis not present

## 2015-04-06 DIAGNOSIS — M6281 Muscle weakness (generalized): Secondary | ICD-10-CM | POA: Diagnosis not present

## 2015-04-06 DIAGNOSIS — I6980 Unspecified sequelae of other cerebrovascular disease: Secondary | ICD-10-CM | POA: Diagnosis not present

## 2015-04-06 DIAGNOSIS — R279 Unspecified lack of coordination: Secondary | ICD-10-CM | POA: Diagnosis not present

## 2015-04-06 DIAGNOSIS — R531 Weakness: Secondary | ICD-10-CM

## 2015-04-06 DIAGNOSIS — IMO0002 Reserved for concepts with insufficient information to code with codable children: Secondary | ICD-10-CM

## 2015-04-06 NOTE — Therapy (Signed)
[facility] [address] [place], [name], [address] Phone: [phone]   Fax:  [phone]  Occupational Therapy Treatment  Patient Details  Name: James Bell MRN: [id number] Date of Birth: [birth year] Referring Provider:  [name], MD  Encounter Date: 04/06/2015      OT End of Session - 04/06/15 1018    Visit Number 10   Number of Visits 16   Date for OT Re-Evaluation 04/13/15   Authorization Type MCR trad, tricare - G code needed   OT Start Time 0845   OT Stop Time 0928   OT Time Calculation (min) 43 min   Activity Tolerance Patient tolerated treatment well      Past Medical History  Diagnosis Date  . HLD (hyperlipidemia)   . Restless leg syndrome     well controlled on mirapex  . OSA on CPAP     sleep study 01/2014 - severe, CPAP at 10, previously on nuvigil (Dr. [name])  . Type 2 diabetes, controlled, with neuropathy 2011  . H/O hiatal hernia   . GERD (gastroesophageal reflux disease)     with sliding HH  . Migraines 03/11/12    now rare  . CVA (cerebral infarction) 03/07/12    slurred speech; right hemplegia, left handed - completed PT 07/2014 (17 sessions)  . Basal cell carcinoma of nose 2002    bridge of nose  . History of kidney problems 12/2010    lacerated left kidney after fall  . BPH with obstruction/lower urinary tract symptoms     failed flomax, uroxatral, myrbetriq - sees urology [name]) pending videourodynamics  . Depression     mild, on cymbalta per prior PCP  . Palpitations 01/2012    holter WNL, rare PAC/PVCs  . Erectile dysfunction     per urology (prostaglandin injection)  . Kidney cysts 02/2014    bilateral (renal US)  . CKD (chronic kidney disease) stage 3, GFR 30-59 ml/min 03/11/2012    Renal US 02/2014 - multiple kidney cysts   . TIA (transient ischemic attack)   . Anemia     Past Surgical History  Procedure Laterality Date  . Prostate surgery   02/2009    PVP (laser)  . Tonsillectomy and adenoidectomy  ~ 1945  . Hemorrhoid surgery  1991  . Cataract extraction w/ intraocular lens implant  11//2012 - 08/2011  . Inguinal hernia repair  1981; 2002    left, then repaired  . Skin cancer excision  2002    bridge of nose, B[facility]  . Colonoscopy  05/2011    normal per pt (Dr. [name], [place], [name])  . Mri brain  04/2010    WNL  . Esophagogastroduodenoscopy  2006    duodenitis, medual HH, Grade 2 reflux, mild gastritis  . Colonoscopy  03/2011    poor prep, unable to biopsy polyp in tic fold, rpt 3 mo [name])  . Abi  2007    WNL  . Urethral dilation  2014    tannenbaum  . Cystoscopy with urethral dilatation N/A 07/24/2013    Procedure: CYSTOSCOPY WITH URETHRAL DILATATION;  Surgeon: [name], MD;  Location: [facility];  Service: Urology;  Laterality: N/A;  . Transurethral incision of prostate N/A 07/24/2013    Procedure: TRANSURETHRAL INCISION OF THE PROSTATE (TUIP);  Surgeon: [name], MD;  Location: [facility];  Service: Urology;  Laterality: N/A;  . Esophagogastroduodenoscopy  07/2011    mild chronic gastritis  . Colonoscopy  06/2011    mult polyps adenomatous, severe diverticulosis, rpt 3 yrs [name])  . Carotid endarterectomy    . Cardiac catheterization  2004    normal; [facility]  . Cardiovascular stress test  02/2014    WNL, low risk scan, EF 68%    There were no vitals filed for this visit.  Visit Diagnosis:  Hemiplegia affecting right side in right-dominant patient as late effect of cerebrovascular disease - Plan: Ot plan of care cert/re-cert  Lack of coordination due to stroke - Plan: Ot plan of care cert/re-cert  Muscle weakness - Plan: Ot plan of care cert/re-cert      Subjective Assessment - 04/06/15 0848    Subjective  I saw the dr yesterday for a cough but I feel better after starting a new antibiotic   Pertinent History see epic snapshot;  pt with H/o of CVA   Patient Stated Goals I have made  alot of progress but would like to be able to do even more.   Currently in Pain? No/denies                      OT Treatments/Exercises (OP) - 04/06/15 0001    ADLs   Writing Practiced printing and cursive for first name only. Used various types of AE and pt felt he did best with pencil with brown foam.  Pt able to print name 2 times legibly. Hand becomes fatigued very quickly and tone increases with effort. DIscussed strategies for using big letters, moving paper vs moving hand across paper, and relaxing between letters. Pt to practice at home and issued brown foam.   Neurological Re-education Exercises   Other Exercises 1 Neuro re ed to work on  functional reach. Pt able to reach with assisted reach at approximately 80* of flexion with min compensations. Pt with moderate difficulty to open hand at mid reach level. Pt improved with practice.  Able to pick up small circumference cone at mid reach with min assist.  Pt benefits from repetition for motor learning. Pt practicing assisted reach at home.                  OT Short Term Goals - 04/06/15 0947    OT SHORT TERM GOAL #1   Title Pt will be mod I with HEP - 03/16/2015   Status Achieved   OT SHORT TERM GOAL #2   Title Pt will demonstrate ability to complete mid level reach to pick up light object off shelf   Status Partially Met  with mod compensations   OT SHORT TERM GOAL #3   Title Pt will report no greater than 2/10 pain in R shoulder with shoulder flexion to 130* and abduction to 100* in supine.   Status Achieved  140* shoulder flexion/105* abduction with no pain   OT SHORT TERM GOAL #4   Title Pt will demonstrate understanding of strategies to compensate for tying and zipping with AE prn   Status Achieved           OT Long Term Goals - 04/06/15 0947    OT LONG TERM GOAL #1   Title Pt will be mod I with upgraded HEP prn - 04/13/2015   Status On-going   OT LONG TERM GOAL #2   Title Pt will demonstrate  ability for 90* of shoulder flexion with reach to obtain light object off shelf with min compensations   Status Revised  03/16/2015   OT LONG TERM  GOAL #3   Title Pt will demonstrate improved coordination as evidenced by completing 9 hole peg in no more than 1.50 seconds  (baseline= 4 pegs)   Status Revised  pt met this goal on 03/16/2015 therefore goal revised   OT LONG TERM GOAL #4   Title Pt will be able to write name with AE prn with R hand legibily   Status Achieved   OT LONG TERM GOAL #5   Title Pt will demonstrate ability to use RUE as non dominant for basic ADL' tasks (baseline= gross assist)   Status Achieved   OT LONG TERM GOAL #6   Title Pt will be able to use RUE in cooking task as non dominant for at least 50% of the task.    Status Achieved               Plan - 05-04-15 0948    Clinical Impression Statement Pt continues to make steady progress toward goals. Pt missed several appointments due brief hospitalization and some health issues therefore will extend to complete current plan of care. Pt in agreement.   Pt will benefit from skilled therapeutic intervention in order to improve on the following deficits (Retired) Decreased balance;Decreased coordination;Decreased mobility;Decreased range of motion;Decreased strength;Impaired UE functional use;Impaired tone;Impaired flexibility;Pain;Difficulty walking   Rehab Potential Good   Clinical Impairments Affecting Rehab Potential Pt will benefit from skilled OT to address the above deficits.   OT Frequency 2x / week   OT Duration 8 weeks   OT Treatment/Interventions Self-care/ADL training;Moist Heat;DME and/or AE instruction;Neuromuscular education;Therapeutic exercise;Functional Mobility Training;Manual Therapy;Passive range of motion;Therapeutic activities;Balance training;Patient/family education   Plan neuro re ed to address mid reach, coordination   Consulted and Agree with Plan of Care Patient          G-Codes -  2015/05/04 1018    Functional Limitation Carrying, moving and handling objects   Carrying, Moving and Handling Objects Current Status [phone]) At least 60 percent but less than 80 percent impaired, limited or restricted   Carrying, Moving and Handling Objects Goal Status ([id number]) At least 40 percent but less than 60 percent impaired, limited or restricted      Problem List Patient Active Problem List   Diagnosis Date Noted  . Hearing loss 03/23/2015  . HTN (hypertension) 03/18/2015  . Chronic diastolic heart failure, NYHA class 1 03/18/2015  . Spastic hemiparesis affecting dominant side 07/07/2014  . Right leg swelling 06/09/2014  . Left shoulder pain 03/16/2014  . Chest pressure 02/03/2014  . Spastic hemiplegia affecting dominant side 11/07/2013  . Medicare annual wellness visit, subsequent 11/06/2013  . Erectile dysfunction due to arterial insufficiency 02/05/2013  . Depression   . Palpitations 06/26/2012  . HLD (hyperlipidemia)   . Restless leg syndrome   . GERD (gastroesophageal reflux disease)   . BPH with obstruction/lower urinary tract symptoms   . History of CVA (cerebrovascular accident) 03/11/2012  . Hemiparesis affecting right side as late effect of stroke 03/11/2012  . Type 2 diabetes, controlled, with neuropathy 03/11/2012  . OSA (obstructive sleep apnea) 03/11/2012  . CKD (chronic kidney disease) stage 3, GFR 30-59 ml/min 03/11/2012  . Migraines 03/11/2012    Quay Burow, OTR/L May 04, 2015, 10:23 AM  [facility] [address] [place], [name], [address] Phone: [phone]   Fax:  [phone]

## 2015-04-06 NOTE — Therapy (Signed)
[facility] [address] [place], [name], [address] Phone: [phone]   Fax:  [phone]  Physical Therapy Treatment  Patient Details  Name: James Bell MRN: [id number] Date of Birth: 19-Sep-1930 Referring Provider:  [name], MD  Encounter Date: 04/06/2015      PT End of Session - 04/06/15 0931    Visit Number 5   Number of Visits 12   Date for PT Re-Evaluation 04/30/15   Authorization Type G-code required every 10th visit for Medicare.   PT Start Time 0932   PT Stop Time 1015   PT Time Calculation (min) 43 min   Equipment Utilized During Treatment Gait belt   Activity Tolerance Patient tolerated treatment well   Behavior During Therapy WFL for tasks assessed/performed      Past Medical History  Diagnosis Date  . HLD (hyperlipidemia)   . Restless leg syndrome     well controlled on mirapex  . OSA on CPAP     sleep study 01/2014 - severe, CPAP at 10, previously on nuvigil (Dr. [name])  . Type 2 diabetes, controlled, with neuropathy 2011  . H/O hiatal hernia   . GERD (gastroesophageal reflux disease)     with sliding HH  . Migraines 03/11/12    now rare  . CVA (cerebral infarction) 03/07/12    slurred speech; right hemplegia, left handed - completed PT 07/2014 (17 sessions)  . Basal cell carcinoma of nose 2002    bridge of nose  . History of kidney problems 12/2010    lacerated left kidney after fall  . BPH with obstruction/lower urinary tract symptoms     failed flomax, uroxatral, myrbetriq - sees urology [name]) pending videourodynamics  . Depression     mild, on cymbalta per prior PCP  . Palpitations 01/2012    holter WNL, rare PAC/PVCs  . Erectile dysfunction     per urology (prostaglandin injection)  . Kidney cysts 02/2014    bilateral (renal US)  . CKD (chronic kidney disease) stage 3, GFR 30-59 ml/min 03/11/2012    Renal US 02/2014 - multiple kidney cysts   . TIA  (transient ischemic attack)   . Anemia     Past Surgical History  Procedure Laterality Date  . Prostate surgery  02/2009    PVP (laser)  . Tonsillectomy and adenoidectomy  ~ 1945  . Hemorrhoid surgery  1991  . Cataract extraction w/ intraocular lens implant  11//2012 - 08/2011  . Inguinal hernia repair  1981; 2002    left, then repaired  . Skin cancer excision  2002    bridge of nose, BCC  . Colonoscopy  05/2011    normal per pt (Dr. [name], [place], [name])  . Mri brain  04/2010    WNL  . Esophagogastroduodenoscopy  2006    duodenitis, medual HH, Grade 2 reflux, mild gastritis  . Colonoscopy  03/2011    poor prep, unable to biopsy polyp in tic fold, rpt 3 mo [name])  . Abi  2007    WNL  . Urethral dilation  2014    tannenbaum  . Cystoscopy with urethral dilatation N/A 07/24/2013    Procedure: CYSTOSCOPY WITH URETHRAL DILATATION;  Surgeon: [name], MD;  Location: [facility];  Service: Urology;  Laterality: N/A;  . Transurethral incision of prostate N/A 07/24/2013    Procedure: TRANSURETHRAL INCISION OF THE PROSTATE (TUIP);  Surgeon: [name], MD;  Location: [facility];  Service: Urology;  Laterality: N/A;  . Esophagogastroduodenoscopy  07/2011    mild chronic gastritis  . Colonoscopy  06/2011    mult polyps adenomatous, severe diverticulosis, rpt 3 yrs [name])  . Carotid endarterectomy    . Cardiac catheterization  2004    normal; [facility]  . Cardiovascular stress test  02/2014    WNL, low risk scan, EF 68%    Filed Vitals:    Visit Diagnosis:  Abnormality of gait  Muscle weakness  Generalized weakness      Subjective Assessment - 04/06/15 1250    Subjective "I've been a little bit wobbly."  Pt reports that he notices when he sits down for hours, he has difficulty using his right leg.  He notes if he sits for 3+ hours, he cannot take weight through the right leg well upon standing.    Patient Stated Goals Return to walking more confidently and  improve balance.   Currently in Pain? --        NEUROMUSCULAR RE-EDUCATION: Berg=46/56      [facility] PT Assessment - 04/06/15 0939    Standardized Balance Assessment   Standardized Balance Assessment Berg Balance Test   Berg Balance Test   Sit to Stand Able to stand without using hands and stabilize independently   Standing Unsupported Able to stand safely 2 minutes   Sitting with Back Unsupported but Feet Supported on Floor or Stool Able to sit safely and securely 2 minutes   Stand to Sit Sits safely with minimal use of hands   Transfers Able to transfer safely, minor use of hands   Standing Unsupported with Eyes Closed Able to stand 10 seconds safely   Standing Ubsupported with Feet Together Able to place feet together independently and stand 1 minute safely   From Standing, Reach Forward with Outstretched Arm Can reach confidently >25 cm (10")   From Standing Position, Pick up Object from Floor Able to pick up shoe safely and easily   From Standing Position, Turn to Look Behind Over each Shoulder Looks behind one side only/other side shows less weight shift   Turn 360 Degrees Able to turn 360 degrees safely one side only in 4 seconds or less   Standing Unsupported, Alternately Place Feet on Step/Stool Able to complete >2 steps/needs minimal assist   Standing Unsupported, One Foot in Front Able to take small step independently and hold 30 seconds   Standing on One Leg Tries to lift leg/unable to hold 3 seconds but remains standing independently   Total Score 46      THERAPEUTIC EXERCISE: Hip abduction sidelying R LE with tactile cues on technique PNF D1/D2 with manual resistance R LE Clam shell R hip abduction x 10 reps SLR x 10 reps with cues on maintaining R LE in hip abduction (vs. Adducting across midline)  Gait: Treadmill x 4 minutes with cues on L LE abducting from midline with UE support from 1.2-1.3 mph Gait x 400' with [facility] with cues on foot position (wider base of  support) Gait with rollater RW x 200 ft with cues on reciprocal pattern and foot position (Patient and OT had discussed using an alternative device first thing in morning when he reported feeling more unsteady)- patient felt rollater RW would be preferred      PT Short Term Goals - 04/06/15 0936    PT SHORT TERM GOAL #1   Title Pt will be independent in HEP to improve strength and functional mobility.    Baseline Pt doing  some HEP, however PT to further review.   Time 4   Period Weeks   Status Partially Met   PT SHORT TERM GOAL #2   Title Pt will improve Berg from 42/56 up to > or equal to 45/56 to demo decreasing risk for falls.   Baseline Met on 04/06/2015 scoring 46/56   Time 4   Period Weeks   Status Achieved   PT SHORT TERM GOAL #3   Title The patient will increase gait speed from 1.85 ft/sec up to 2.2 ft/sec to demo improving gait speed for functional mobility.   Baseline Partially met, with patient improving to 2.01 ft/sec on 04/06/2015.   Time 4   Period Weeks   Status Partially Met   PT SHORT TERM GOAL #4   Title Pt will ambulate > or equal to 400 ft nonstop on level/unlevel surfaces with LRAD modified indep to demo improving endurance.   Baseline Patient ambulates 400 ft, however required supervision when fatigued due to narrowing base of support.   Time 4   Period Weeks   Status Partially Met           PT Long Term Goals - 03/16/15 1354    PT LONG TERM GOAL #1   Title Pt will be able to verbalize understanding of falls prevention strategies to reduce risk of falls.    Baseline Target date 04/30/2015   Time 8   Period Weeks   Status On-going   PT LONG TERM GOAL #2   Title Patient will improve Berg from 42/56 up to > or equal to 49/56 to demo decreased risk for fall.   Baseline Target date 04/30/2015   Time 8   Period Weeks   Status On-going   PT LONG TERM GOAL #3   Title The patient will increase gait speed from 1.85 ft/sec up to 2.6 ft/sec to demo improving  functional mobility.   Baseline Target date 04/30/2015   Time 8   Period Weeks   Status On-going   PT LONG TERM GOAL #4   Title The patient will ambulate for 10 minutes nonstop to demo improved endurance for functional mobility.   Baseline Target date 04/30/2015   Time 8   Period Weeks   Status On-going   PT LONG TERM GOAL #5   Title Pt will verbalize understanding of HEP for post d/c activities.   Baseline Target date 04/30/2015   Time 8   Period Weeks   Status On-going   PT LONG TERM GOAL #6   Title Pt will improve ABC scale from 53.8% up to 68%.   Baseline Target date 04/30/2015   Time 8   Period Weeks   Status On-going               Plan - 04/06/15 2223    Clinical Impression Statement The patient met STG for Berg and partially met other STGs.  PT to continue with emphasis on LE strengthening, balance, endurance, and gait.   Pt will benefit from skilled therapeutic intervention in order to improve on the following deficits Abnormal gait;Decreased endurance;Decreased balance;Decreased mobility;Decreased strength;Decreased knowledge of use of DME;Decreased range of motion;Impaired flexibility;Impaired UE functional use;Postural dysfunction   Rehab Potential Good   PT Frequency 2x / week   PT Duration 4 weeks   PT Treatment/Interventions ADLs/Self Care Home Management;DME Instruction;Balance training;Manual techniques;Therapeutic exercise;Therapeutic activities;Patient/family education;Functional mobility training;Stair training;Gait training;Neuromuscular re-education   PT Next Visit Plan Gait training, balance, hip stabilization  *initially planned on  decreasing to 1x/week after first 4 weeks, however patient has been scheduled 1x/week since eval due to scheduling- PT to continue to meet  initial recommended frequency.   Consulted and Agree with Plan of Care Patient        Problem List Patient Active Problem List   Diagnosis Date Noted  . Hearing loss 03/23/2015  .  HTN (hypertension) 03/18/2015  . Chronic diastolic heart failure, NYHA class 1 03/18/2015  . Spastic hemiparesis affecting dominant side 07/07/2014  . Right leg swelling 06/09/2014  . Left shoulder pain 03/16/2014  . Chest pressure 02/03/2014  . Spastic hemiplegia affecting dominant side 11/07/2013  . Medicare annual wellness visit, subsequent 11/06/2013  . Erectile dysfunction due to arterial insufficiency 02/05/2013  . Depression   . Palpitations 06/26/2012  . HLD (hyperlipidemia)   . Restless leg syndrome   . GERD (gastroesophageal reflux disease)   . BPH with obstruction/lower urinary tract symptoms   . History of CVA (cerebrovascular accident) 03/11/2012  . Hemiparesis affecting right side as late effect of stroke 03/11/2012  . Type 2 diabetes, controlled, with neuropathy 03/11/2012  . OSA (obstructive sleep apnea) 03/11/2012  . CKD (chronic kidney disease) stage 3, GFR 30-59 ml/min 03/11/2012  . Migraines 03/11/2012    Margarett Viti, PT 04/06/2015, 10:29 PM  [facility] [address] [place], [name], [address] Phone: [phone]   Fax:  [phone]

## 2015-04-08 ENCOUNTER — Ambulatory Visit (HOSPITAL_BASED_OUTPATIENT_CLINIC_OR_DEPARTMENT_OTHER): Payer: Medicare Other | Admitting: Physical Medicine & Rehabilitation

## 2015-04-08 ENCOUNTER — Encounter: Payer: Self-pay | Admitting: Occupational Therapy

## 2015-04-08 ENCOUNTER — Encounter: Payer: Medicare Other | Attending: Physical Medicine & Rehabilitation

## 2015-04-08 ENCOUNTER — Ambulatory Visit: Payer: Medicare Other | Admitting: Rehabilitative and Restorative Service Providers"

## 2015-04-08 ENCOUNTER — Ambulatory Visit: Payer: Medicare Other | Admitting: Occupational Therapy

## 2015-04-08 ENCOUNTER — Encounter: Payer: Self-pay | Admitting: Physical Medicine & Rehabilitation

## 2015-04-08 VITALS — BP 142/70 | HR 76 | Resp 14

## 2015-04-08 DIAGNOSIS — G8110 Spastic hemiplegia affecting unspecified side: Secondary | ICD-10-CM | POA: Insufficient documentation

## 2015-04-08 DIAGNOSIS — I69951 Hemiplegia and hemiparesis following unspecified cerebrovascular disease affecting right dominant side: Secondary | ICD-10-CM | POA: Diagnosis not present

## 2015-04-08 DIAGNOSIS — M6281 Muscle weakness (generalized): Secondary | ICD-10-CM

## 2015-04-08 DIAGNOSIS — M7989 Other specified soft tissue disorders: Secondary | ICD-10-CM | POA: Insufficient documentation

## 2015-04-08 DIAGNOSIS — I6980 Unspecified sequelae of other cerebrovascular disease: Secondary | ICD-10-CM | POA: Diagnosis not present

## 2015-04-08 DIAGNOSIS — R279 Unspecified lack of coordination: Secondary | ICD-10-CM | POA: Diagnosis not present

## 2015-04-08 DIAGNOSIS — R531 Weakness: Secondary | ICD-10-CM

## 2015-04-08 DIAGNOSIS — R269 Unspecified abnormalities of gait and mobility: Secondary | ICD-10-CM | POA: Diagnosis not present

## 2015-04-08 DIAGNOSIS — G8113 Spastic hemiplegia affecting right nondominant side: Secondary | ICD-10-CM | POA: Diagnosis not present

## 2015-04-08 NOTE — Therapy (Signed)
[facility] [address] [place], [name], [address] Phone: [phone]   Fax:  [phone]  Physical Therapy Treatment  Patient Details  Name: James Bell MRN: [id number] Date of Birth: 12/14/1930 Referring Provider:  [name], MD  Encounter Date: 04/08/2015      PT End of Session - 04/08/15 2107    Visit Number 6   Number of Visits 12   Date for PT Re-Evaluation 04/30/15   Authorization Type G-code required every 10th visit for Medicare.   PT Start Time [phone]   PT Stop Time 1018   PT Time Calculation (min) 45 min   Equipment Utilized During Treatment Gait belt   Activity Tolerance Patient tolerated treatment well   Behavior During Therapy WFL for tasks assessed/performed      Past Medical History  Diagnosis Date  . HLD (hyperlipidemia)   . Restless leg syndrome     well controlled on mirapex  . OSA on CPAP     sleep study 01/2014 - severe, CPAP at 10, previously on nuvigil (Dr. [name])  . Type 2 diabetes, controlled, with neuropathy 2011  . H/O hiatal hernia   . GERD (gastroesophageal reflux disease)     with sliding HH  . Migraines 03/11/12    now rare  . CVA (cerebral infarction) 03/07/12    slurred speech; right hemplegia, left handed - completed PT 07/2014 (17 sessions)  . Basal cell carcinoma of nose 2002    bridge of nose  . History of kidney problems 12/2010    lacerated left kidney after fall  . BPH with obstruction/lower urinary tract symptoms     failed flomax, uroxatral, myrbetriq - sees urology [name]) pending videourodynamics  . Depression     mild, on cymbalta per prior PCP  . Palpitations 01/2012    holter WNL, rare PAC/PVCs  . Erectile dysfunction     per urology (prostaglandin injection)  . Kidney cysts 02/2014    bilateral (renal US)  . CKD (chronic kidney disease) stage 3, GFR 30-59 ml/min 03/11/2012    Renal US 02/2014 - multiple kidney cysts   . TIA  (transient ischemic attack)   . Anemia     Past Surgical History  Procedure Laterality Date  . Prostate surgery  02/2009    PVP (laser)  . Tonsillectomy and adenoidectomy  ~ 1945  . Hemorrhoid surgery  1991  . Cataract extraction w/ intraocular lens implant  11//2012 - 08/2011  . Inguinal hernia repair  1981; 2002    left, then repaired  . Skin cancer excision  2002    bridge of nose, BCC  . Colonoscopy  05/2011    normal per pt (Dr. [name], [place], [name])  . Mri brain  04/2010    WNL  . Esophagogastroduodenoscopy  2006    duodenitis, medual HH, Grade 2 reflux, mild gastritis  . Colonoscopy  03/2011    poor prep, unable to biopsy polyp in tic fold, rpt 3 mo [name])  . Abi  2007    WNL  . Urethral dilation  2014    tannenbaum  . Cystoscopy with urethral dilatation N/A 07/24/2013    Procedure: CYSTOSCOPY WITH URETHRAL DILATATION;  Surgeon: [name], MD;  Location: [facility];  Service: Urology;  Laterality: N/A;  . Transurethral incision of prostate N/A 07/24/2013    Procedure: TRANSURETHRAL INCISION OF THE PROSTATE (TUIP);  Surgeon: [name], MD;  Location: [facility];  Service: Urology;  Laterality: N/A;  . Esophagogastroduodenoscopy  07/2011    mild chronic gastritis  . Colonoscopy  06/2011    mult polyps adenomatous, severe diverticulosis, rpt 3 yrs [name])  . Carotid endarterectomy    . Cardiac catheterization  2004    normal; [facility]  . Cardiovascular stress test  02/2014    WNL, low risk scan, EF 68%    There were no vitals filed for this visit.  Visit Diagnosis:  Abnormality of gait  Generalized weakness      Subjective Assessment - 04/08/15 1312    Subjective Patient reports fatigued after OT today.  Pt would not consider using a aluminum RW, but is interested in a rollater RW.   Patient Stated Goals Return to walking more confidently and improve balance.   Currently in Pain? No/denies     Gait: Ambulation with [facility] emphasizing even  stride length, R hip stability during R stance phase x 230 ft, 450 ft, and 300 ft with supervision.  The patient performed 180 degree turns working on widening his base of support for improved stability using SBQC. Gait without device x 230 ft x 2 reps with CGA for safety working on reciprocal pattern and R stance control. Backwards walking at countertop with cues for wider base of support with one UE support and CGA.  NEUROMUSCULAR RE-EDUCATION: March and hold with UE support x 10 feet x 4 repetitions R LE stance control rolling a ball under the L foot with cues on level pelvic and knee position in ant/post and lateral directions Sit<>stand with L LE on compliant surface to increase R LE weight shifting Sit<>stand with R LE positioned posterior to L LE for increased R LE weight shifting       PT Short Term Goals - 04/06/15 0936    PT SHORT TERM GOAL #1   Title Pt will be independent in HEP to improve strength and functional mobility.    Baseline Pt doing some HEP, however PT to further review.   Time 4   Period Weeks   Status Partially Met   PT SHORT TERM GOAL #2   Title Pt will improve Berg from 42/56 up to > or equal to 45/56 to demo decreasing risk for falls.   Baseline Met on 04/06/2015 scoring 46/56   Time 4   Period Weeks   Status Achieved   PT SHORT TERM GOAL #3   Title The patient will increase gait speed from 1.85 ft/sec up to 2.2 ft/sec to demo improving gait speed for functional mobility.   Baseline Partially met, with patient improving to 2.01 ft/sec on 04/06/2015.   Time 4   Period Weeks   Status Partially Met   PT SHORT TERM GOAL #4   Title Pt will ambulate > or equal to 400 ft nonstop on level/unlevel surfaces with LRAD modified indep to demo improving endurance.   Baseline Patient ambulates 400 ft, however required supervision when fatigued due to narrowing base of support.   Time 4   Period Weeks   Status Partially Met           PT Long Term Goals -  03/16/15 1354    PT LONG TERM GOAL #1   Title Pt will be able to verbalize understanding of falls prevention strategies to reduce risk of falls.    Baseline Target date 04/30/2015   Time 8   Period Weeks   Status On-going   PT LONG TERM GOAL #2   Title Patient will  improve Berg from 42/56 up to > or equal to 49/56 to demo decreased risk for fall.   Baseline Target date 04/30/2015   Time 8   Period Weeks   Status On-going   PT LONG TERM GOAL #3   Title The patient will increase gait speed from 1.85 ft/sec up to 2.6 ft/sec to demo improving functional mobility.   Baseline Target date 04/30/2015   Time 8   Period Weeks   Status On-going   PT LONG TERM GOAL #4   Title The patient will ambulate for 10 minutes nonstop to demo improved endurance for functional mobility.   Baseline Target date 04/30/2015   Time 8   Period Weeks   Status On-going   PT LONG TERM GOAL #5   Title Pt will verbalize understanding of HEP for post d/c activities.   Baseline Target date 04/30/2015   Time 8   Period Weeks   Status On-going   PT LONG TERM GOAL #6   Title Pt will improve ABC scale from 53.8% up to 68%.   Baseline Target date 04/30/2015   Time 8   Period Weeks   Status On-going               Plan - 04/08/15 2109    Clinical Impression Statement The patient demonstrated improved mobility today compared to last session with noticeable difference in wider base of support with gait and standing activities, which improved overall balance.  During turns, the patient continues to need cues to widen base of support.       PT Next Visit Plan Gait training, balance, hip stabilization  *initially planned on decreasing to 1x/week after first 4 weeks, however patient has been scheduled 1x/week since eval due to scheduling- PT to continue to meet  initial recommended frequency.   Consulted and Agree with Plan of Care Patient        Problem List Patient Active Problem List   Diagnosis Date Noted  .  Hearing loss 03/23/2015  . HTN (hypertension) 03/18/2015  . Chronic diastolic heart failure, NYHA class 1 03/18/2015  . Spastic hemiparesis affecting dominant side 07/07/2014  . Right leg swelling 06/09/2014  . Left shoulder pain 03/16/2014  . Chest pressure 02/03/2014  . Spastic hemiplegia affecting dominant side 11/07/2013  . Medicare annual wellness visit, subsequent 11/06/2013  . Erectile dysfunction due to arterial insufficiency 02/05/2013  . Depression   . Palpitations 06/26/2012  . HLD (hyperlipidemia)   . Restless leg syndrome   . GERD (gastroesophageal reflux disease)   . BPH with obstruction/lower urinary tract symptoms   . History of CVA (cerebrovascular accident) 03/11/2012  . Hemiparesis affecting right side as late effect of stroke 03/11/2012  . Type 2 diabetes, controlled, with neuropathy 03/11/2012  . OSA (obstructive sleep apnea) 03/11/2012  . CKD (chronic kidney disease) stage 3, GFR 30-59 ml/min 03/11/2012  . Migraines 03/11/2012    Ahmere Hemenway, PT 04/08/2015, 9:13 PM  [facility] [address] [place], [name], [address] Phone: [phone]   Fax:  [phone]

## 2015-04-08 NOTE — Therapy (Signed)
[facility] [address] [place], [name], [address] Phone: [phone]   Fax:  [phone]  Occupational Therapy Treatment  Patient Details  Name: James Bell MRN: [id number] Date of Birth: 07-09-1931 Referring Provider:  [name], MD  Encounter Date: 04/08/2015      OT End of Session - 04/08/15 1233    Visit Number 11   Number of Visits 16   Date for OT Re-Evaluation 04/13/15   Authorization Type MCR trad, tricare - G code needed   OT Start Time 0845   OT Stop Time 0929   OT Time Calculation (min) 44 min   Activity Tolerance Patient tolerated treatment well      Past Medical History  Diagnosis Date  . HLD (hyperlipidemia)   . Restless leg syndrome     well controlled on mirapex  . OSA on CPAP     sleep study 01/2014 - severe, CPAP at 10, previously on nuvigil (Dr. [name])  . Type 2 diabetes, controlled, with neuropathy 2011  . H/O hiatal hernia   . GERD (gastroesophageal reflux disease)     with sliding HH  . Migraines 03/11/12    now rare  . CVA (cerebral infarction) 03/07/12    slurred speech; right hemplegia, left handed - completed PT 07/2014 (17 sessions)  . Basal cell carcinoma of nose 2002    bridge of nose  . History of kidney problems 12/2010    lacerated left kidney after fall  . BPH with obstruction/lower urinary tract symptoms     failed flomax, uroxatral, myrbetriq - sees urology [name]) pending videourodynamics  . Depression     mild, on cymbalta per prior PCP  . Palpitations 01/2012    holter WNL, rare PAC/PVCs  . Erectile dysfunction     per urology (prostaglandin injection)  . Kidney cysts 02/2014    bilateral (renal US)  . CKD (chronic kidney disease) stage 3, GFR 30-59 ml/min 03/11/2012    Renal US 02/2014 - multiple kidney cysts   . TIA (transient ischemic attack)   . Anemia     Past Surgical History  Procedure Laterality Date  . Prostate surgery   02/2009    PVP (laser)  . Tonsillectomy and adenoidectomy  ~ 1945  . Hemorrhoid surgery  1991  . Cataract extraction w/ intraocular lens implant  11//2012 - 08/2011  . Inguinal hernia repair  1981; 2002    left, then repaired  . Skin cancer excision  2002    bridge of nose, BCC  . Colonoscopy  05/2011    normal per pt (Dr. [name], [place], [name])  . Mri brain  04/2010    WNL  . Esophagogastroduodenoscopy  2006    duodenitis, medual HH, Grade 2 reflux, mild gastritis  . Colonoscopy  03/2011    poor prep, unable to biopsy polyp in tic fold, rpt 3 mo [name])  . Abi  2007    WNL  . Urethral dilation  2014    tannenbaum  . Cystoscopy with urethral dilatation N/A 07/24/2013    Procedure: CYSTOSCOPY WITH URETHRAL DILATATION;  Surgeon: [name], MD;  Location: [facility];  Service: Urology;  Laterality: N/A;  . Transurethral incision of prostate N/A 07/24/2013    Procedure: TRANSURETHRAL INCISION OF THE PROSTATE (TUIP);  Surgeon: [name], MD;  Location: [facility];  Service: Urology;  Laterality: N/A;  . Esophagogastroduodenoscopy  07/2011    mild chronic gastritis  . Colonoscopy  06/2011    mult polyps adenomatous, severe diverticulosis, rpt 3 yrs [name])  . Carotid endarterectomy    . Cardiac catheterization  2004    normal; [facility]  . Cardiovascular stress test  02/2014    WNL, low risk scan, EF 68%    There were no vitals filed for this visit.  Visit Diagnosis:  Hemiplegia affecting right side in right-dominant patient as late effect of cerebrovascular disease  Muscle weakness      Subjective Assessment - 04/08/15 0855    Subjective  My hand is very stiff today because I did alot of scap booking yesterday   Pertinent History see epic snapshot;  pt with H/o of CVA   Patient Stated Goals I have made alot of progress but would like to be able to do even more.   Currently in Pain? No/denies                      OT Treatments/Exercises (OP) -  04/08/15 0001    Neurological Re-education Exercises   Other Exercises 1 Neuro re ed to address updating HEP to add resistive weight to home program to build up scapular stability and strength in preparation for mid level reach. Also provided activities to address mid to beginning high reach.  See pt instrucitons for details.                OT Education - 04/08/15 1230    Education provided Yes   Education Details upgraded HEP   Person(s) Educated Patient   Methods Explanation;Demonstration;Verbal cues;Handout   Comprehension Verbalized understanding;Returned demonstration          OT Short Term Goals - 04/08/15 1230    OT SHORT TERM GOAL #1   Title Pt will be mod I with HEP - 03/16/2015   Status Achieved   OT SHORT TERM GOAL #2   Title Pt will demonstrate ability to complete mid level reach to pick up light object off shelf   Status Partially Met  with mod compensations   OT SHORT TERM GOAL #3   Title Pt will report no greater than 2/10 pain in R shoulder with shoulder flexion to 130* and abduction to 100* in supine.   Status Achieved  140* shoulder flexion/105* abduction with no pain   OT SHORT TERM GOAL #4   Title Pt will demonstrate understanding of strategies to compensate for tying and zipping with AE prn   Status Achieved           OT Long Term Goals - 04/08/15 1231    OT LONG TERM GOAL #1   Title Pt will be mod I with upgraded HEP prn - 04/13/2015   Status On-going   OT LONG TERM GOAL #2   Title Pt will demonstrate ability for 90* of shoulder flexion with reach to obtain light object off shelf with min compensations   Status On-going  03/16/2015   OT LONG TERM GOAL #3   Title Pt will demonstrate improved coordination as evidenced by completing 9 hole peg in no more than 1.50 seconds  (baseline= 4 pegs)   Status Revised  pt met this goal on 03/16/2015 therefore goal revised   OT LONG TERM GOAL #4   Title Pt will be able to write name with AE prn with R  hand legibily   Status Achieved   OT LONG TERM GOAL #5   Title Pt will demonstrate ability to use RUE as non dominant for  basic ADL' tasks (baseline= gross assist)   Status Achieved   OT LONG TERM GOAL #6   Title Pt will be able to use RUE in cooking task as non dominant for at least 50% of the task.    Status Achieved               Plan - 04/08/15 1231    Clinical Impression Statement Pt continues to make good gains toward LTG's- due to progress able to update HEP for increased challenge.   Pt will benefit from skilled therapeutic intervention in order to improve on the following deficits (Retired) Decreased balance;Decreased coordination;Decreased mobility;Decreased range of motion;Decreased strength;Impaired UE functional use;Impaired tone;Impaired flexibility;Pain;Difficulty walking   Rehab Potential Good   Clinical Impairments Affecting Rehab Potential Pt will benefit from skilled OT to address the above deficits.   OT Frequency 2x / week   OT Duration 8 weeks   OT Treatment/Interventions Self-care/ADL training;Moist Heat;DME and/or AE instruction;Neuromuscular education;Therapeutic exercise;Functional Mobility Training;Manual Therapy;Passive range of motion;Therapeutic activities;Balance training;Patient/family education   Plan NMR to address mid level reach with hand function   Consulted and Agree with Plan of Care Patient        Problem List Patient Active Problem List   Diagnosis Date Noted  . Hearing loss 03/23/2015  . HTN (hypertension) 03/18/2015  . Chronic diastolic heart failure, NYHA class 1 03/18/2015  . Spastic hemiparesis affecting dominant side 07/07/2014  . Right leg swelling 06/09/2014  . Left shoulder pain 03/16/2014  . Chest pressure 02/03/2014  . Spastic hemiplegia affecting dominant side 11/07/2013  . Medicare annual wellness visit, subsequent 11/06/2013  . Erectile dysfunction due to arterial insufficiency 02/05/2013  . Depression   .  Palpitations 06/26/2012  . HLD (hyperlipidemia)   . Restless leg syndrome   . GERD (gastroesophageal reflux disease)   . BPH with obstruction/lower urinary tract symptoms   . History of CVA (cerebrovascular accident) 03/11/2012  . Hemiparesis affecting right side as late effect of stroke 03/11/2012  . Type 2 diabetes, controlled, with neuropathy 03/11/2012  . OSA (obstructive sleep apnea) 03/11/2012  . CKD (chronic kidney disease) stage 3, GFR 30-59 ml/min 03/11/2012  . Migraines 03/11/2012    Quay Burow, OTR/L 04/08/2015, 12:34 PM  [facility] [address] [place], [name], [address] Phone: [phone]   Fax:  [phone]

## 2015-04-08 NOTE — Patient Instructions (Signed)

## 2015-04-08 NOTE — Patient Instructions (Signed)
Upgrade to your home exercise program for your arm: Do these 2 times a day every day. STOP if you get any pain and go back to your previous exercises. Let your therapist know right away.  1. Lay on your back and hold 2 pound can between your hands.  SLOWLY raise the can toward the ceiling then lower back to your chest.  Do 10, rest then do 10 more.  2. Lay on your back and hold 2 pound can between your hands. SLOWLY raise th can toward the ceiling and then, keeping elbows straight, raise your arms over your head and then down toward your knees.  Do 10, rest then do 10 more.  3. Sit on a firm surface. Hold the ball between your hands.  Bend forward and reach toward the floor then while staying bent over, raise ball up. Bring ball back to just below chest then sit up.  GO SLOW AND DO NOT USE MOMENTUM to raise the ball - momentum will increase your tone.  If your hand gets tight, stop and take a break until it relaxes.  Do 10 only.  Do these exercises slowly - the slower you go the harder you work.  Do not use momentum as this will increase your tone;  Doing the exercises fast will also increase your tone.

## 2015-04-08 NOTE — Progress Notes (Signed)
Botox Injection for spasticity using needle EMG guidance  Dilution: 50 Units/ml Indication: Severe spasticity which interferes with ADL,mobility and/or  hygiene and is unresponsive to medication management and other conservative care Informed consent was obtained after describing risks and benefits of the procedure with the patient. This includes bleeding, bruising, infection, excessive weakness, or medication side effects. A REMS form is on file and signed. Needle: 27g 1" needle electrode Number of units per muscle  Biceps100 FCR50 FCU0 FDS50 FDP0 FPL0   All injections were done after obtaining appropriate EMG activity and after negative drawback for blood. The patient tolerated the procedure well. Post procedure instructions were given. A followup appointment was made.

## 2015-04-12 ENCOUNTER — Other Ambulatory Visit: Payer: Self-pay | Admitting: Family Medicine

## 2015-04-13 ENCOUNTER — Ambulatory Visit: Payer: Medicare Other | Admitting: Rehabilitative and Restorative Service Providers"

## 2015-04-13 DIAGNOSIS — R531 Weakness: Secondary | ICD-10-CM

## 2015-04-13 DIAGNOSIS — M6281 Muscle weakness (generalized): Secondary | ICD-10-CM | POA: Diagnosis not present

## 2015-04-13 DIAGNOSIS — R279 Unspecified lack of coordination: Secondary | ICD-10-CM | POA: Diagnosis not present

## 2015-04-13 DIAGNOSIS — R269 Unspecified abnormalities of gait and mobility: Secondary | ICD-10-CM | POA: Diagnosis not present

## 2015-04-13 DIAGNOSIS — I6980 Unspecified sequelae of other cerebrovascular disease: Secondary | ICD-10-CM | POA: Diagnosis not present

## 2015-04-13 DIAGNOSIS — I69951 Hemiplegia and hemiparesis following unspecified cerebrovascular disease affecting right dominant side: Secondary | ICD-10-CM | POA: Diagnosis not present

## 2015-04-13 NOTE — Therapy (Signed)
[facility] [address] [place], [place], [address] Phone: [phone]   Fax:  [phone]  Physical Therapy Treatment  Patient Details  Name: James Bell MRN: [id number] Date of Birth: 03/21/1931 Referring Provider:  Eustaquio Boyden, MD  Encounter Date: 04/13/2015      PT End of Session - 04/13/15 1219    Visit Number 7   Number of Visits 12   Date for PT Re-Evaluation 04/30/15   Authorization Type G-code required every 10th visit for Medicare.   PT Start Time 0935   PT Stop Time 1020   PT Time Calculation (min) 45 min   Equipment Utilized During Treatment Gait belt   Activity Tolerance Patient tolerated treatment well   Behavior During Therapy WFL for tasks assessed/performed      Past Medical History  Diagnosis Date  . HLD (hyperlipidemia)   . Restless leg syndrome     well controlled on mirapex  . OSA on CPAP     sleep study 01/2014 - severe, CPAP at 10, previously on nuvigil (Dr. Gareth Morgan)  . Type 2 diabetes, controlled, with neuropathy 2011  . H/O hiatal hernia   . GERD (gastroesophageal reflux disease)     with sliding HH  . Migraines 03/11/12    now rare  . CVA (cerebral infarction) 03/07/12    slurred speech; right hemplegia, left handed - completed PT 07/2014 (17 sessions)  . Basal cell carcinoma of nose 2002    bridge of nose  . History of kidney problems 12/2010    lacerated left kidney after fall  . BPH with obstruction/lower urinary tract symptoms     failed flomax, uroxatral, myrbetriq - sees urology Patsi Sears) pending videourodynamics  . Depression     mild, on cymbalta per prior PCP  . Palpitations 01/2012    holter WNL, rare PAC/PVCs  . Erectile dysfunction     per urology (prostaglandin injection)  . Kidney cysts 02/2014    bilateral (renal US)  . CKD (chronic kidney disease) stage 3, GFR 30-59 ml/min 03/11/2012    Renal US 02/2014 - multiple kidney cysts   . TIA  (transient ischemic attack)   . Anemia     Past Surgical History  Procedure Laterality Date  . Prostate surgery  02/2009    PVP (laser)  . Tonsillectomy and adenoidectomy  ~ 1945  . Hemorrhoid surgery  1991  . Cataract extraction w/ intraocular lens implant  11//2012 - 08/2011  . Inguinal hernia repair  1981; 2002    left, then repaired  . Skin cancer excision  2002    bridge of nose, BCC  . Colonoscopy  05/2011    normal per pt (Dr. Pearlean Brownie, [place], [place])  . Mri brain  04/2010    WNL  . Esophagogastroduodenoscopy  2006    duodenitis, medual HH, Grade 2 reflux, mild gastritis  . Colonoscopy  03/2011    poor prep, unable to biopsy polyp in tic fold, rpt 3 mo Pearlean Brownie)  . Abi  2007    WNL  . Urethral dilation  2014    tannenbaum  . Cystoscopy with urethral dilatation N/A 07/24/2013    Procedure: CYSTOSCOPY WITH URETHRAL DILATATION;  Surgeon: Kathi Ludwig, MD;  Location: [facility];  Service: Urology;  Laterality: N/A;  . Transurethral incision of prostate N/A 07/24/2013    Procedure: TRANSURETHRAL INCISION OF THE PROSTATE (TUIP);  Surgeon: Kathi Ludwig, MD;  Location: [facility];  Service: Urology;  Laterality: N/A;  . Esophagogastroduodenoscopy  07/2011    mild chronic gastritis  . Colonoscopy  06/2011    mult polyps adenomatous, severe diverticulosis, rpt 3 yrs [name])  . Carotid endarterectomy    . Cardiac catheterization  2004    normal; [facility]  . Cardiovascular stress test  02/2014    WNL, low risk scan, EF 68%    There were no vitals filed for this visit.  Visit Diagnosis:  Abnormality of gait  Generalized weakness      Subjective Assessment - 04/13/15 0939    Subjective Patient more fatigued due to heat and staying inside more.  Pt had botox in R biceps on 04/08/15 MD appt.   Patient Stated Goals Return to walking more confidently and improve balance.   Currently in Pain? No/denies      Gait: Ambulation with SBQC x 230 feet modified indep today SPC  x 345 feet x 2 reps with cues on reciprocal pattern with CGA  NEUROMUSCULAR RE-EDUCATION: Sit<>stand with compliant surface under L foot Moving L foot onto/off of 6" step R LE moving from 6-18" steps working on R motor control Standing moving L foot onto/off of compliant surface  THERAPEUTIC EXERCISE: Step ups to R side x 10 reps Step ups to L side x 10 reps Hamstring stretch seated provided for HEP       PT Education - 04/13/15 1016    Education provided Yes   Education Details HEP: hamstring stretch   Person(s) Educated Patient   Methods Explanation;Demonstration;Handout;Verbal cues   Comprehension Returned demonstration;Verbalized understanding          PT Short Term Goals - 04/06/15 0936    PT SHORT TERM GOAL #1   Title Pt will be independent in HEP to improve strength and functional mobility.    Baseline Pt doing some HEP, however PT to further review.   Time 4   Period Weeks   Status Partially Met   PT SHORT TERM GOAL #2   Title Pt will improve Berg from 42/56 up to > or equal to 45/56 to demo decreasing risk for falls.   Baseline Met on 04/06/2015 scoring 46/56   Time 4   Period Weeks   Status Achieved   PT SHORT TERM GOAL #3   Title The patient will increase gait speed from 1.85 ft/sec up to 2.2 ft/sec to demo improving gait speed for functional mobility.   Baseline Partially met, with patient improving to 2.01 ft/sec on 04/06/2015.   Time 4   Period Weeks   Status Partially Met   PT SHORT TERM GOAL #4   Title Pt will ambulate > or equal to 400 ft nonstop on level/unlevel surfaces with LRAD modified indep to demo improving endurance.   Baseline Patient ambulates 400 ft, however required supervision when fatigued due to narrowing base of support.   Time 4   Period Weeks   Status Partially Met           PT Long Term Goals - 03/16/15 1354    PT LONG TERM GOAL #1   Title Pt will be able to verbalize understanding of falls prevention strategies to reduce  risk of falls.    Baseline Target date 04/30/2015   Time 8   Period Weeks   Status On-going   PT LONG TERM GOAL #2   Title Patient will improve Berg from 42/56 up to > or equal to 49/56 to demo decreased risk for fall.   Baseline Target  date 04/30/2015   Time 8   Period Weeks   Status On-going   PT LONG TERM GOAL #3   Title The patient will increase gait speed from 1.85 ft/sec up to 2.6 ft/sec to demo improving functional mobility.   Baseline Target date 04/30/2015   Time 8   Period Weeks   Status On-going   PT LONG TERM GOAL #4   Title The patient will ambulate for 10 minutes nonstop to demo improved endurance for functional mobility.   Baseline Target date 04/30/2015   Time 8   Period Weeks   Status On-going   PT LONG TERM GOAL #5   Title Pt will verbalize understanding of HEP for post d/c activities.   Baseline Target date 04/30/2015   Time 8   Period Weeks   Status On-going   PT LONG TERM GOAL #6   Title Pt will improve ABC scale from 53.8% up to 68%.   Baseline Target date 04/30/2015   Time 8   Period Weeks   Status On-going               Plan - 04/13/15 1220    Clinical Impression Statement The patient demonstrates wider base of support again today.  He reports that he is still using a wheelchair some in his home (at desk and when fatigued) and PT to request order for rollater RW for him to use the rollater in place of the wheelchair for increased activity.   Pt will benefit from skilled therapeutic intervention in order to improve on the following deficits Abnormal gait;Decreased endurance;Decreased balance;Decreased mobility;Decreased strength;Decreased knowledge of use of DME;Decreased range of motion;Impaired flexibility;Impaired UE functional use;Postural dysfunction   Rehab Potential Good   PT Frequency 2x / week   PT Duration 4 weeks   PT Treatment/Interventions ADLs/Self Care Home Management;DME Instruction;Balance training;Manual techniques;Therapeutic  exercise;Therapeutic activities;Patient/family education;Functional mobility training;Stair training;Gait training;Neuromuscular re-education   PT Next Visit Plan Gait training, balance, hip stabilization  *initially planned on decreasing to 1x/week after first 4 weeks, however patient has been scheduled 1x/week since eval due to scheduling- PT to continue to meet  initial recommended frequency.   Consulted and Agree with Plan of Care Patient        Problem List Patient Active Problem List   Diagnosis Date Noted  . Hearing loss 03/23/2015  . HTN (hypertension) 03/18/2015  . Chronic diastolic heart failure, NYHA class 1 03/18/2015  . Spastic hemiparesis affecting dominant side 07/07/2014  . Right leg swelling 06/09/2014  . Left shoulder pain 03/16/2014  . Chest pressure 02/03/2014  . Spastic hemiplegia affecting dominant side 11/07/2013  . Medicare annual wellness visit, subsequent 11/06/2013  . Erectile dysfunction due to arterial insufficiency 02/05/2013  . Depression   . Palpitations 06/26/2012  . HLD (hyperlipidemia)   . Restless leg syndrome   . GERD (gastroesophageal reflux disease)   . BPH with obstruction/lower urinary tract symptoms   . History of CVA (cerebrovascular accident) 03/11/2012  . Hemiparesis affecting right side as late effect of stroke 03/11/2012  . Type 2 diabetes, controlled, with neuropathy 03/11/2012  . OSA (obstructive sleep apnea) 03/11/2012  . CKD (chronic kidney disease) stage 3, GFR 30-59 ml/min 03/11/2012  . Migraines 03/11/2012    Lean Fayson, PT 04/13/2015, 2:27 PM  [facility] [address] [place], [name], [address] Phone: [phone]   Fax:  [phone]

## 2015-04-13 NOTE — Patient Instructions (Signed)
Hamstring Stretch, Seated (Strap, Two Chairs)   Sit with one leg extended onto facing chair. NO LOOP/STRAP FOR NOW.  Lengthen spine. Hold for _20-30 seconds. Repeat __3__ times each leg.  Copyright  VHI. All rights reserved.

## 2015-04-15 ENCOUNTER — Ambulatory Visit: Payer: Medicare Other | Admitting: Occupational Therapy

## 2015-04-15 ENCOUNTER — Encounter: Payer: Self-pay | Admitting: Occupational Therapy

## 2015-04-15 ENCOUNTER — Telehealth: Payer: Self-pay | Admitting: Rehabilitative and Restorative Service Providers"

## 2015-04-15 ENCOUNTER — Ambulatory Visit: Payer: Medicare Other | Admitting: Rehabilitative and Restorative Service Providers"

## 2015-04-15 DIAGNOSIS — R269 Unspecified abnormalities of gait and mobility: Secondary | ICD-10-CM | POA: Diagnosis not present

## 2015-04-15 DIAGNOSIS — M6281 Muscle weakness (generalized): Secondary | ICD-10-CM | POA: Diagnosis not present

## 2015-04-15 DIAGNOSIS — R279 Unspecified lack of coordination: Secondary | ICD-10-CM | POA: Diagnosis not present

## 2015-04-15 DIAGNOSIS — R531 Weakness: Secondary | ICD-10-CM | POA: Diagnosis not present

## 2015-04-15 DIAGNOSIS — G8191 Hemiplegia, unspecified affecting right dominant side: Secondary | ICD-10-CM

## 2015-04-15 DIAGNOSIS — I6980 Unspecified sequelae of other cerebrovascular disease: Secondary | ICD-10-CM | POA: Diagnosis not present

## 2015-04-15 DIAGNOSIS — I69951 Hemiplegia and hemiparesis following unspecified cerebrovascular disease affecting right dominant side: Secondary | ICD-10-CM

## 2015-04-15 NOTE — Therapy (Signed)
[facility] [address] [place], [place], [address] Phone: [phone]   Fax:  [phone]  Occupational Therapy Treatment  Patient Details  Name: James Bell MRN: [id number] Date of Birth: 04-02-1931 Referring Provider:  Eustaquio Boyden, MD  Encounter Date: 04/15/2015      OT End of Session - 04/15/15 1314    Visit Number 12   Number of Visits 16   Date for OT Re-Evaluation 04/13/15   Authorization Type MCR trad, tricare - G code needed   Authorization - Visit Number 12   Authorization - Number of Visits 16   OT Start Time 1100   OT Stop Time 1155   OT Time Calculation (min) 55 min   Activity Tolerance Patient tolerated treatment well   Behavior During Therapy [facility] for tasks assessed/performed      Past Medical History  Diagnosis Date  . HLD (hyperlipidemia)   . Restless leg syndrome     well controlled on mirapex  . OSA on CPAP     sleep study 01/2014 - severe, CPAP at 10, previously on nuvigil (Dr. Gareth Morgan)  . Type 2 diabetes, controlled, with neuropathy 2011  . H/O hiatal hernia   . GERD (gastroesophageal reflux disease)     with sliding HH  . Migraines 03/11/12    now rare  . CVA (cerebral infarction) 03/07/12    slurred speech; right hemplegia, left handed - completed PT 07/2014 (17 sessions)  . Basal cell carcinoma of nose 2002    bridge of nose  . History of kidney problems 12/2010    lacerated left kidney after fall  . BPH with obstruction/lower urinary tract symptoms     failed flomax, uroxatral, myrbetriq - sees urology Patsi Sears) pending videourodynamics  . Depression     mild, on cymbalta per prior PCP  . Palpitations 01/2012    holter WNL, rare PAC/PVCs  . Erectile dysfunction     per urology (prostaglandin injection)  . Kidney cysts 02/2014    bilateral (renal US)  . CKD (chronic kidney disease) stage 3, GFR 30-59 ml/min 03/11/2012    Renal US 02/2014 - multiple kidney cysts    . TIA (transient ischemic attack)   . Anemia     Past Surgical History  Procedure Laterality Date  . Prostate surgery  02/2009    PVP (laser)  . Tonsillectomy and adenoidectomy  ~ 1945  . Hemorrhoid surgery  1991  . Cataract extraction w/ intraocular lens implant  11//2012 - 08/2011  . Inguinal hernia repair  1981; 2002    left, then repaired  . Skin cancer excision  2002    bridge of nose, BCC  . Colonoscopy  05/2011    normal per pt (Dr. Pearlean Brownie, [place], [place])  . Mri brain  04/2010    WNL  . Esophagogastroduodenoscopy  2006    duodenitis, medual HH, Grade 2 reflux, mild gastritis  . Colonoscopy  03/2011    poor prep, unable to biopsy polyp in tic fold, rpt 3 mo Pearlean Brownie)  . Abi  2007    WNL  . Urethral dilation  2014    tannenbaum  . Cystoscopy with urethral dilatation N/A 07/24/2013    Procedure: CYSTOSCOPY WITH URETHRAL DILATATION;  Surgeon: Kathi Ludwig, MD;  Location: [facility];  Service: Urology;  Laterality: N/A;  . Transurethral incision of prostate N/A 07/24/2013    Procedure: TRANSURETHRAL INCISION OF THE PROSTATE (TUIP);  Surgeon: Kathi Ludwig, MD;  Location: [facility];  Service: Urology;  Laterality: N/A;  . Esophagogastroduodenoscopy  07/2011    mild chronic gastritis  . Colonoscopy  06/2011    mult polyps adenomatous, severe diverticulosis, rpt 3 yrs [name])  . Carotid endarterectomy    . Cardiac catheterization  2004    normal; [facility]  . Cardiovascular stress test  02/2014    WNL, low risk scan, EF 68%    There were no vitals filed for this visit.  Visit Diagnosis:  Hemiplegia affecting right side in right-dominant patient as late effect of cerebrovascular disease      Subjective Assessment - 04/15/15 1333    Currently in Pain? No/denies   Pain Score 0-No pain                      OT Treatments/Exercises (OP) - 04/15/15 0001    Splinting   Splinting Fabricated right resting hand splint today.  Patient with recent botox  injections to decrease spasticity in wrist and finger flexors.  Fabricated custom splint which can be worn in conjuction with oval 8 splint.  Patient able to don and doff splint independently.  Patient educated in splint wearing schedule and skin assessment.  Patient instructed to stop wearing and bring splint back for modification as needed if redness or swelling appear.                  OT Education - 04/15/15 1312    Education provided Yes   Education Details Splint wearing schedule - at night and during resting periods.  Skin assessment - remove splint after initial hour and assess skin for redness, bruising, etc.  Make note of any problem areas as splint can be easily modified as needed   Person(s) Educated Patient   Methods Explanation;Demonstration   Comprehension Verbalized understanding;Returned demonstration          OT Short Term Goals - 04/08/15 1230    OT SHORT TERM GOAL #1   Title Pt will be mod I with HEP - 03/16/2015   Status Achieved   OT SHORT TERM GOAL #2   Title Pt will demonstrate ability to complete mid level reach to pick up light object off shelf   Status Partially Met  with mod compensations   OT SHORT TERM GOAL #3   Title Pt will report no greater than 2/10 pain in R shoulder with shoulder flexion to 130* and abduction to 100* in supine.   Status Achieved  140* shoulder flexion/105* abduction with no pain   OT SHORT TERM GOAL #4   Title Pt will demonstrate understanding of strategies to compensate for tying and zipping with AE prn   Status Achieved           OT Long Term Goals - 04/15/15 1321    OT LONG TERM GOAL #1   Title Pt will be mod I with upgraded HEP prn - 05/25/2015   Time 8   Period Weeks   Status On-going   OT LONG TERM GOAL #2   Title Pt will demonstrate ability for 90* of shoulder flexion with reach to obtain light object off shelf with min compensations   Time 6   Period Weeks   Status On-going   OT LONG TERM GOAL #3    Title Pt will demonstrate improved coordination as evidenced by completing 9 hole peg in no more than 1.50 seconds  (baseline= 4 pegs)   Time 6   Period Weeks  Status On-going   OT LONG TERM GOAL #4   Title Pt will be able to write name with AE prn with R hand legibily   Status Achieved   OT LONG TERM GOAL #5   Title Pt will demonstrate ability to use RUE as non dominant for basic ADL' tasks (baseline= gross assist)   Status Achieved   OT LONG TERM GOAL #6   Title Pt will be able to use RUE in cooking task as non dominant for at least 50% of the task.    Status Achieved               Plan - 04/15/15 1315    Clinical Impression Statement Patient pleased with progress thus far in OT, and reports success with updated HEP.  Patient with recent Botox injections to right upper extremity, and reporting decrease in trension in elbow, wrist,a d finger flexors.   Pt will benefit from skilled therapeutic intervention in order to improve on the following deficits (Retired) Decreased balance;Decreased coordination;Decreased mobility;Decreased range of motion;Decreased strength;Impaired UE functional use;Impaired tone;Impaired flexibility;Pain;Difficulty walking   Rehab Potential Good   Clinical Impairments Affecting Rehab Potential Pt will benefit from skilled OT to address the above deficits.   OT Frequency 2x / week   OT Duration 8 weeks   OT Treatment/Interventions Self-care/ADL training;Moist Heat;DME and/or AE instruction;Neuromuscular education;Therapeutic exercise;Functional Mobility Training;Manual Therapy;Passive range of motion;Therapeutic activities;Balance training;Patient/family education   Plan NMR to address mid level reach with hand function   OT Home Exercise Plan ongoing   Consulted and Agree with Plan of Care Patient        Problem List Patient Active Problem List   Diagnosis Date Noted  . Hearing loss 03/23/2015  . HTN (hypertension) 03/18/2015  . Chronic diastolic  heart failure, NYHA class 1 03/18/2015  . Spastic hemiparesis affecting dominant side 07/07/2014  . Right leg swelling 06/09/2014  . Left shoulder pain 03/16/2014  . Chest pressure 02/03/2014  . Spastic hemiplegia affecting dominant side 11/07/2013  . Medicare annual wellness visit, subsequent 11/06/2013  . Erectile dysfunction due to arterial insufficiency 02/05/2013  . Depression   . Palpitations 06/26/2012  . HLD (hyperlipidemia)   . Restless leg syndrome   . GERD (gastroesophageal reflux disease)   . BPH with obstruction/lower urinary tract symptoms   . History of CVA (cerebrovascular accident) 03/11/2012  . Hemiparesis affecting right side as late effect of stroke 03/11/2012  . Type 2 diabetes, controlled, with neuropathy 03/11/2012  . OSA (obstructive sleep apnea) 03/11/2012  . CKD (chronic kidney disease) stage 3, GFR 30-59 ml/min 03/11/2012  . Migraines 03/11/2012    Mariah Milling, OTR/L 04/15/2015, 1:41 PM  [facility] [address] [place], [name], [address] Phone: [phone]   Fax:  [phone]

## 2015-04-15 NOTE — Telephone Encounter (Signed)
Mr. Toomey is currently using manual w/c in his home when fatigued.  He reports he is willing to use a rollater RW to have more support when fatigued, but still be ambulatory to decrease sitting and dependence on manual w/c. We have tried in P.T.  If you agree, please submit order in epic and I can print out for patient to take to DME provider.  Thanks, Rudell Cobb, [place]

## 2015-04-15 NOTE — Therapy (Signed)
[facility] [address] [place], [name], [address] Phone: [phone]   Fax:  [phone]  Physical Therapy Treatment  Patient Details  Name: James Bell MRN: [id number] Date of Birth: November 11, 1930 Referring Provider:  [name], MD  Encounter Date: 04/15/2015      PT End of Session - 04/15/15 1024    Visit Number 8   Number of Visits 12   Date for PT Re-Evaluation 04/30/15   Authorization Type G-code required every 10th visit for Medicare.   PT Start Time 1020   PT Stop Time 1100   PT Time Calculation (min) 40 min   Equipment Utilized During Treatment Gait belt   Activity Tolerance Patient tolerated treatment well   Behavior During Therapy WFL for tasks assessed/performed      Past Medical History  Diagnosis Date  . HLD (hyperlipidemia)   . Restless leg syndrome     well controlled on mirapex  . OSA on CPAP     sleep study 01/2014 - severe, CPAP at 10, previously on nuvigil (Dr. [name])  . Type 2 diabetes, controlled, with neuropathy 2011  . H/O hiatal hernia   . GERD (gastroesophageal reflux disease)     with sliding HH  . Migraines 03/11/12    now rare  . CVA (cerebral infarction) 03/07/12    slurred speech; right hemplegia, left handed - completed PT 07/2014 (17 sessions)  . Basal cell carcinoma of nose 2002    bridge of nose  . History of kidney problems 12/2010    lacerated left kidney after fall  . BPH with obstruction/lower urinary tract symptoms     failed flomax, uroxatral, myrbetriq - sees urology [name]) pending videourodynamics  . Depression     mild, on cymbalta per prior PCP  . Palpitations 01/2012    holter WNL, rare PAC/PVCs  . Erectile dysfunction     per urology (prostaglandin injection)  . Kidney cysts 02/2014    bilateral (renal US)  . CKD (chronic kidney disease) stage 3, GFR 30-59 ml/min 03/11/2012    Renal US 02/2014 - multiple kidney cysts   . TIA  (transient ischemic attack)   . Anemia     Past Surgical History  Procedure Laterality Date  . Prostate surgery  02/2009    PVP (laser)  . Tonsillectomy and adenoidectomy  ~ 1945  . Hemorrhoid surgery  1991  . Cataract extraction w/ intraocular lens implant  11//2012 - 08/2011  . Inguinal hernia repair  1981; 2002    left, then repaired  . Skin cancer excision  2002    bridge of nose, [facility]  . Colonoscopy  05/2011    normal per pt (Dr. [name], [place], [name])  . Mri brain  04/2010    WNL  . Esophagogastroduodenoscopy  2006    duodenitis, medual HH, Grade 2 reflux, mild gastritis  . Colonoscopy  03/2011    poor prep, unable to biopsy polyp in tic fold, rpt 3 mo [name])  . Abi  2007    WNL  . Urethral dilation  2014    tannenbaum  . Cystoscopy with urethral dilatation N/A 07/24/2013    Procedure: CYSTOSCOPY WITH URETHRAL DILATATION;  Surgeon: [name], MD;  Location: [facility];  Service: Urology;  Laterality: N/A;  . Transurethral incision of prostate N/A 07/24/2013    Procedure: TRANSURETHRAL INCISION OF THE PROSTATE (TUIP);  Surgeon: [name], MD;  Location: [facility];  Service: Urology;  Laterality: N/A;  . Esophagogastroduodenoscopy  07/2011    mild chronic gastritis  . Colonoscopy  06/2011    mult polyps adenomatous, severe diverticulosis, rpt 3 yrs [name])  . Carotid endarterectomy    . Cardiac catheterization  2004    normal; [facility]  . Cardiovascular stress test  02/2014    WNL, low risk scan, EF 68%    There were no vitals filed for this visit.  Visit Diagnosis:  Abnormality of gait  Generalized weakness      Subjective Assessment - 04/15/15 1021    Subjective Patient had PT help set him up with home exercise equipment (he reports leg press, seated stepper, long arc quads, and one UE machine).  Patient was tired after last session.   Patient Stated Goals Return to walking more confidently and improve balance.   Currently in Pain? No/denies       Gait: Treadmill x 6 minutes at 1.4 to 1.6 mph with UE support increasing incline to up to 3% for a portion of exercise period with cues on wider base of support Gait with SPC x 230 ft x 3 reps with cues on R UE position and posture and cues on foot placement  NEUROMUSCULAR RE-EDUCATION: R weight shifting activities in standing emphasizing R hip control with L foot on compliant surface R/L alternating LE foot taps for weight shifting  SELF CARE/HOME MANAGEMENT: Discussed home exercise and increasing walking frequency in the home, recommending patient add 5 minutes of walking after breakfast to begin to increase activity       PT Short Term Goals - 04/06/15 0936    PT SHORT TERM GOAL #1   Title Pt will be independent in HEP to improve strength and functional mobility.    Baseline Pt doing some HEP, however PT to further review.   Time 4   Period Weeks   Status Partially Met   PT SHORT TERM GOAL #2   Title Pt will improve Berg from 42/56 up to > or equal to 45/56 to demo decreasing risk for falls.   Baseline Met on 04/06/2015 scoring 46/56   Time 4   Period Weeks   Status Achieved   PT SHORT TERM GOAL #3   Title The patient will increase gait speed from 1.85 ft/sec up to 2.2 ft/sec to demo improving gait speed for functional mobility.   Baseline Partially met, with patient improving to 2.01 ft/sec on 04/06/2015.   Time 4   Period Weeks   Status Partially Met   PT SHORT TERM GOAL #4   Title Pt will ambulate > or equal to 400 ft nonstop on level/unlevel surfaces with LRAD modified indep to demo improving endurance.   Baseline Patient ambulates 400 ft, however required supervision when fatigued due to narrowing base of support.   Time 4   Period Weeks   Status Partially Met           PT Long Term Goals - 03/16/15 1354    PT LONG TERM GOAL #1   Title Pt will be able to verbalize understanding of falls prevention strategies to reduce risk of falls.    Baseline Target date  04/30/2015   Time 8   Period Weeks   Status On-going   PT LONG TERM GOAL #2   Title Patient will improve Berg from 42/56 up to > or equal to 49/56 to demo decreased risk for fall.   Baseline Target date 04/30/2015   Time 8   Period  Weeks   Status On-going   PT LONG TERM GOAL #3   Title The patient will increase gait speed from 1.85 ft/sec up to 2.6 ft/sec to demo improving functional mobility.   Baseline Target date 04/30/2015   Time 8   Period Weeks   Status On-going   PT LONG TERM GOAL #4   Title The patient will ambulate for 10 minutes nonstop to demo improved endurance for functional mobility.   Baseline Target date 04/30/2015   Time 8   Period Weeks   Status On-going   PT LONG TERM GOAL #5   Title Pt will verbalize understanding of HEP for post d/c activities.   Baseline Target date 04/30/2015   Time 8   Period Weeks   Status On-going   PT LONG TERM GOAL #6   Title Pt will improve ABC scale from 53.8% up to 68%.   Baseline Target date 04/30/2015   Time 8   Period Weeks   Status On-going               Plan - 04/15/15 1436    Clinical Impression Statement The patient demonstrates improved reciprocal nature of gait with SPC.  He has variable performance depending on fatigue and PT still encourages use of quad cane in the community for greater support.  Also, order requested for rollater RW to take the place of w/c use when fatigued in the home.  PT recommended the patient decrease reliance on the wheelchair for increased ambulation/activity.   PT Next Visit Plan Gait training, balance, hip stabilization  *initially planned on decreasing to 1x/week after first 4 weeks, however patient has been scheduled 1x/week since eval due to scheduling- PT to continue to meet  initial recommended frequency.   Consulted and Agree with Plan of Care Patient        Problem List Patient Active Problem List   Diagnosis Date Noted  . Hearing loss 03/23/2015  . HTN (hypertension)  03/18/2015  . Chronic diastolic heart failure, NYHA class 1 03/18/2015  . Spastic hemiparesis affecting dominant side 07/07/2014  . Right leg swelling 06/09/2014  . Left shoulder pain 03/16/2014  . Chest pressure 02/03/2014  . Spastic hemiplegia affecting dominant side 11/07/2013  . Medicare annual wellness visit, subsequent 11/06/2013  . Erectile dysfunction due to arterial insufficiency 02/05/2013  . Depression   . Palpitations 06/26/2012  . HLD (hyperlipidemia)   . Restless leg syndrome   . GERD (gastroesophageal reflux disease)   . BPH with obstruction/lower urinary tract symptoms   . History of CVA (cerebrovascular accident) 03/11/2012  . Hemiparesis affecting right side as late effect of stroke 03/11/2012  . Type 2 diabetes, controlled, with neuropathy 03/11/2012  . OSA (obstructive sleep apnea) 03/11/2012  . CKD (chronic kidney disease) stage 3, GFR 30-59 ml/min 03/11/2012  . Migraines 03/11/2012    Nobuko Gsell, PT 04/15/2015, 2:37 PM  [facility] [address] [place], [name], [address] Phone: [phone]   Fax:  [phone]

## 2015-04-19 DIAGNOSIS — I6930 Unspecified sequelae of cerebral infarction: Secondary | ICD-10-CM | POA: Diagnosis not present

## 2015-04-19 DIAGNOSIS — E039 Hypothyroidism, unspecified: Secondary | ICD-10-CM | POA: Diagnosis not present

## 2015-04-19 DIAGNOSIS — I10 Essential (primary) hypertension: Secondary | ICD-10-CM | POA: Diagnosis not present

## 2015-04-19 DIAGNOSIS — E785 Hyperlipidemia, unspecified: Secondary | ICD-10-CM | POA: Diagnosis not present

## 2015-04-19 DIAGNOSIS — Z794 Long term (current) use of insulin: Secondary | ICD-10-CM | POA: Diagnosis not present

## 2015-04-19 DIAGNOSIS — E1142 Type 2 diabetes mellitus with diabetic polyneuropathy: Secondary | ICD-10-CM | POA: Diagnosis not present

## 2015-04-19 DIAGNOSIS — Z8673 Personal history of transient ischemic attack (TIA), and cerebral infarction without residual deficits: Secondary | ICD-10-CM | POA: Diagnosis not present

## 2015-04-19 DIAGNOSIS — F325 Major depressive disorder, single episode, in full remission: Secondary | ICD-10-CM | POA: Diagnosis not present

## 2015-04-19 DIAGNOSIS — G4730 Sleep apnea, unspecified: Secondary | ICD-10-CM | POA: Diagnosis not present

## 2015-04-20 ENCOUNTER — Ambulatory Visit: Payer: Medicare Other | Admitting: Rehabilitative and Restorative Service Providers"

## 2015-04-20 ENCOUNTER — Encounter: Payer: Self-pay | Admitting: Occupational Therapy

## 2015-04-20 ENCOUNTER — Other Ambulatory Visit: Payer: Self-pay | Admitting: *Deleted

## 2015-04-20 ENCOUNTER — Ambulatory Visit: Payer: Medicare Other | Attending: Physical Medicine & Rehabilitation | Admitting: Occupational Therapy

## 2015-04-20 DIAGNOSIS — R279 Unspecified lack of coordination: Secondary | ICD-10-CM | POA: Insufficient documentation

## 2015-04-20 DIAGNOSIS — M6281 Muscle weakness (generalized): Secondary | ICD-10-CM | POA: Diagnosis not present

## 2015-04-20 DIAGNOSIS — R531 Weakness: Secondary | ICD-10-CM | POA: Diagnosis not present

## 2015-04-20 DIAGNOSIS — R269 Unspecified abnormalities of gait and mobility: Secondary | ICD-10-CM

## 2015-04-20 DIAGNOSIS — I69951 Hemiplegia and hemiparesis following unspecified cerebrovascular disease affecting right dominant side: Secondary | ICD-10-CM | POA: Diagnosis not present

## 2015-04-20 DIAGNOSIS — I6980 Unspecified sequelae of other cerebrovascular disease: Secondary | ICD-10-CM | POA: Insufficient documentation

## 2015-04-20 DIAGNOSIS — IMO0002 Reserved for concepts with insufficient information to code with codable children: Secondary | ICD-10-CM

## 2015-04-20 NOTE — Therapy (Signed)
[facility] [address] [place], [name], [address] Phone: [phone]   Fax:  [phone]  Physical Therapy Treatment  Patient Details  Name: James Bell MRN: [id number] Date of Birth: [birth year] Referring Provider:  [name], MD  Encounter Date: 04/20/2015      PT End of Session - 04/20/15 1428    Visit Number 9   Number of Visits 12   Date for PT Re-Evaluation 04/30/15   Authorization Type G-code required every 10th visit for Medicare.   PT Start Time 0935   PT Stop Time 1020   PT Time Calculation (min) 45 min   Equipment Utilized During Treatment Gait belt   Activity Tolerance Patient tolerated treatment well   Behavior During Therapy WFL for tasks assessed/performed      Past Medical History  Diagnosis Date  . HLD (hyperlipidemia)   . Restless leg syndrome     well controlled on mirapex  . OSA on CPAP     sleep study 01/2014 - severe, CPAP at 10, previously on nuvigil (Dr. [name])  . Type 2 diabetes, controlled, with neuropathy 2011  . H/O hiatal hernia   . GERD (gastroesophageal reflux disease)     with sliding HH  . Migraines 03/11/12    now rare  . CVA (cerebral infarction) 03/07/12    slurred speech; right hemplegia, left handed - completed PT 07/2014 (17 sessions)  . Basal cell carcinoma of nose 2002    bridge of nose  . History of kidney problems 12/2010    lacerated left kidney after fall  . BPH with obstruction/lower urinary tract symptoms     failed flomax, uroxatral, myrbetriq - sees urology [name]) pending videourodynamics  . Depression     mild, on cymbalta per prior PCP  . Palpitations 01/2012    holter WNL, rare PAC/PVCs  . Erectile dysfunction     per urology (prostaglandin injection)  . Kidney cysts 02/2014    bilateral (renal US)  . CKD (chronic kidney disease) stage 3, GFR 30-59 ml/min 03/11/2012    Renal US 02/2014 - multiple kidney cysts   . TIA  (transient ischemic attack)   . Anemia     Past Surgical History  Procedure Laterality Date  . Prostate surgery  02/2009    PVP (laser)  . Tonsillectomy and adenoidectomy  ~ 1945  . Hemorrhoid surgery  1991  . Cataract extraction w/ intraocular lens implant  11//2012 - 08/2011  . Inguinal hernia repair  1981; 2002    left, then repaired  . Skin cancer excision  2002    bridge of nose, BCC  . Colonoscopy  05/2011    normal per pt (Dr. [name], [place], [name])  . Mri brain  04/2010    WNL  . Esophagogastroduodenoscopy  2006    duodenitis, medual HH, Grade 2 reflux, mild gastritis  . Colonoscopy  03/2011    poor prep, unable to biopsy polyp in tic fold, rpt 3 mo [name])  . Abi  2007    WNL  . Urethral dilation  2014    tannenbaum  . Cystoscopy with urethral dilatation N/A 07/24/2013    Procedure: CYSTOSCOPY WITH URETHRAL DILATATION;  Surgeon: [name], MD;  Location: [facility];  Service: Urology;  Laterality: N/A;  . Transurethral incision of prostate N/A 07/24/2013    Procedure: TRANSURETHRAL INCISION OF THE PROSTATE (TUIP);  Surgeon: [name], MD;  Location: [facility];  Service: Urology;  Laterality: N/A;  . Esophagogastroduodenoscopy  07/2011    mild chronic gastritis  . Colonoscopy  06/2011    mult polyps adenomatous, severe diverticulosis, rpt 3 yrs [name])  . Carotid endarterectomy    . Cardiac catheterization  2004    normal; [facility]  . Cardiovascular stress test  02/2014    WNL, low risk scan, EF 68%    There were no vitals filed for this visit.  Visit Diagnosis:  Abnormality of gait  Muscle weakness  Generalized weakness      Subjective Assessment - 04/20/15 0950    Subjective Patient doing HEP regularly per report.   Patient Stated Goals Return to walking more confidently and improve balance.   Currently in Pain? No/denies      Gait: Gait x 10 min with SBQC one loss of balance requiring min A to recover on level surfaces Gait with  rollater RW x 400 ft nonstop encouraging reciprocal pattern modified i ndep Gait with SBQC trying 1/4" heel lift on R LE.  Patient did not clear his R foot as well and continued to have minimal knee recurvatum, therefore, PT recommended no lift. Gait with  SBQC x 115 feet x 3 reps with cues on gait mechanics and posture.  SELF CARE/HOME MANAGEMENT: Discussed post d/c plan and incorporating greater activity into day to day routine. Provided patient rollater RW prescription.  Pt plans to go to DME store to pick out.  Discussed benefit of rollater RW over use of w/c in the home environment and how to load/unload device into car.        PT Short Term Goals - 04/06/15 0936    PT SHORT TERM GOAL #1   Title Pt will be independent in HEP to improve strength and functional mobility.    Baseline Pt doing some HEP, however PT to further review.   Time 4   Period Weeks   Status Partially Met   PT SHORT TERM GOAL #2   Title Pt will improve Berg from 42/56 up to > or equal to 45/56 to demo decreasing risk for falls.   Baseline Met on 04/06/2015 scoring 46/56   Time 4   Period Weeks   Status Achieved   PT SHORT TERM GOAL #3   Title The patient will increase gait speed from 1.85 ft/sec up to 2.2 ft/sec to demo improving gait speed for functional mobility.   Baseline Partially met, with patient improving to 2.01 ft/sec on 04/06/2015.   Time 4   Period Weeks   Status Partially Met   PT SHORT TERM GOAL #4   Title Pt will ambulate > or equal to 400 ft nonstop on level/unlevel surfaces with LRAD modified indep to demo improving endurance.   Baseline Patient ambulates 400 ft, however required supervision when fatigued due to narrowing base of support.   Time 4   Period Weeks   Status Partially Met           PT Long Term Goals - 04/20/15 1429    PT LONG TERM GOAL #1   Title Pt will be able to verbalize understanding of falls prevention strategies to reduce risk of falls.    Baseline Target date  04/30/2015   Time 8   Period Weeks   Status On-going   PT LONG TERM GOAL #2   Title Patient will improve Berg from 42/56 up to > or equal to 49/56 to demo decreased risk for fall.   Baseline Target date 04/30/2015  Time 8   Period Weeks   Status On-going   PT LONG TERM GOAL #3   Title The patient will increase gait speed from 1.85 ft/sec up to 2.6 ft/sec to demo improving functional mobility.   Baseline Target date 04/30/2015   Time 8   Period Weeks   Status On-going   PT LONG TERM GOAL #4   Title The patient will ambulate for 10 minutes nonstop to demo improved endurance for functional mobility.   Baseline Goal met on 04/20/15 with patient walking for 10+ minutes.  He had one loss of balance requiring assist to recover when using SBQC.   Time 8   Period Weeks   Status Achieved   PT LONG TERM GOAL #5   Title Pt will verbalize understanding of HEP for post d/c activities.   Baseline Met on 04/20/2015 with patient with fitness center routine and HEP written.   Time 8   Period Weeks   Status Achieved   PT LONG TERM GOAL #6   Title Pt will improve ABC scale from 53.8% up to 68%.   Baseline Target date 04/30/2015   Time 8   Period Weeks   Status On-going               Plan - 04/20/15 1429    Clinical Impression Statement The patient met one LTG for extended ambulation.  PT to continue progressing activity to patient tolerance emphasizing increased R LE motor control and balance.   PT Next Visit Plan Gait training, balance, hip stabilization  *initially planned on decreasing to 1x/week after first 4 weeks, however patient has been scheduled 1x/week since eval due to scheduling- PT to continue to meet  initial recommended frequency.   Consulted and Agree with Plan of Care Patient        Problem List Patient Active Problem List   Diagnosis Date Noted  . Hearing loss 03/23/2015  . HTN (hypertension) 03/18/2015  . Chronic diastolic heart failure, NYHA class 1 03/18/2015  .  Spastic hemiparesis affecting dominant side 07/07/2014  . Right leg swelling 06/09/2014  . Left shoulder pain 03/16/2014  . Chest pressure 02/03/2014  . Spastic hemiplegia affecting dominant side 11/07/2013  . Medicare annual wellness visit, subsequent 11/06/2013  . Erectile dysfunction due to arterial insufficiency 02/05/2013  . Depression   . Palpitations 06/26/2012  . HLD (hyperlipidemia)   . Restless leg syndrome   . GERD (gastroesophageal reflux disease)   . BPH with obstruction/lower urinary tract symptoms   . History of CVA (cerebrovascular accident) 03/11/2012  . Hemiparesis affecting right side as late effect of stroke 03/11/2012  . Type 2 diabetes, controlled, with neuropathy 03/11/2012  . OSA (obstructive sleep apnea) 03/11/2012  . CKD (chronic kidney disease) stage 3, GFR 30-59 ml/min 03/11/2012  . Migraines 03/11/2012    WEAVER,CHRISTINA, PT 04/20/2015, 2:32 PM  [facility] [address] [place], [name], [address] Phone: [phone]   Fax:  [phone]

## 2015-04-20 NOTE — Therapy (Signed)
[facility] [address] [place], [name], [address] Phone: [phone]   Fax:  [phone]  Occupational Therapy Treatment  Patient Details  Name: James Bell MRN: [id number] Date of Birth: 1930/09/27 Referring Provider:  [name], MD  Encounter Date: 04/20/2015      OT End of Session - 04/20/15 1253    Visit Number 13   Number of Visits 16   Date for OT Re-Evaluation 04/13/15   Authorization Type MCR trad, tricare - G code needed   OT Start Time 0845   OT Stop Time 0929   OT Time Calculation (min) 44 min   Activity Tolerance Patient tolerated treatment well      Past Medical History  Diagnosis Date  . HLD (hyperlipidemia)   . Restless leg syndrome     well controlled on mirapex  . OSA on CPAP     sleep study 01/2014 - severe, CPAP at 10, previously on nuvigil (Dr. [name])  . Type 2 diabetes, controlled, with neuropathy 2011  . H/O hiatal hernia   . GERD (gastroesophageal reflux disease)     with sliding HH  . Migraines 03/11/12    now rare  . CVA (cerebral infarction) 03/07/12    slurred speech; right hemplegia, left handed - completed PT 07/2014 (17 sessions)  . Basal cell carcinoma of nose 2002    bridge of nose  . History of kidney problems 12/2010    lacerated left kidney after fall  . BPH with obstruction/lower urinary tract symptoms     failed flomax, uroxatral, myrbetriq - sees urology [name]) pending videourodynamics  . Depression     mild, on cymbalta per prior PCP  . Palpitations 01/2012    holter WNL, rare PAC/PVCs  . Erectile dysfunction     per urology (prostaglandin injection)  . Kidney cysts 02/2014    bilateral (renal US)  . CKD (chronic kidney disease) stage 3, GFR 30-59 ml/min 03/11/2012    Renal US 02/2014 - multiple kidney cysts   . TIA (transient ischemic attack)   . Anemia     Past Surgical History  Procedure Laterality Date  . Prostate surgery  02/2009     PVP (laser)  . Tonsillectomy and adenoidectomy  ~ 1945  . Hemorrhoid surgery  1991  . Cataract extraction w/ intraocular lens implant  11//2012 - 08/2011  . Inguinal hernia repair  1981; 2002    left, then repaired  . Skin cancer excision  2002    bridge of nose, BCC  . Colonoscopy  05/2011    normal per pt (Dr. [name], [place], [name])  . Mri brain  04/2010    WNL  . Esophagogastroduodenoscopy  2006    duodenitis, medual HH, Grade 2 reflux, mild gastritis  . Colonoscopy  03/2011    poor prep, unable to biopsy polyp in tic fold, rpt 3 mo [name])  . Abi  2007    WNL  . Urethral dilation  2014    tannenbaum  . Cystoscopy with urethral dilatation N/A 07/24/2013    Procedure: CYSTOSCOPY WITH URETHRAL DILATATION;  Surgeon: [name], MD;  Location: [facility];  Service: Urology;  Laterality: N/A;  . Transurethral incision of prostate N/A 07/24/2013    Procedure: TRANSURETHRAL INCISION OF THE PROSTATE (TUIP);  Surgeon: [name], MD;  Location: [facility];  Service: Urology;  Laterality: N/A;  . Esophagogastroduodenoscopy  07/2011    mild chronic gastritis  . Colonoscopy  06/2011    mult polyps adenomatous, severe diverticulosis, rpt 3 yrs Pearlean Brownie)  . Carotid endarterectomy    . Cardiac catheterization  2004    normal; [facility]  . Cardiovascular stress test  02/2014    WNL, low risk scan, EF 68%    There were no vitals filed for this visit.  Visit Diagnosis:  Hemiplegia affecting right side in right-dominant patient as late effect of cerebrovascular disease  Generalized weakness  Muscle weakness  Lack of coordination due to stroke      Subjective Assessment - 04/20/15 0850    Subjective  I forgot the splint and it will need some adjusting I will bring it next time.   Pertinent History see epic snapshot;  pt with H/o of CVA   Patient Stated Goals I have made alot of progress but would like to be able to do even more.   Currently in Pain? No/denies                       OT Treatments/Exercises (OP) - 04/20/15 0001    Neurological Re-education Exercises   Other Exercises 1 Neuro re ed to address mid to high reach in supine with resistance, closed chain activity then low to high reach in sitting closed chain (pt needed light min faciltiation from 80-125* of reach). Progressed to low to mid reach in standing open chain incoporating grasp and release and then functional activity of reach, grasp ,release and placing of glass of water incorporating low to mid reach. Pt requires mod vc's an min facilitation to reach with hand vs hiking shoulder.                  OT Short Term Goals - 04/20/15 1249    OT SHORT TERM GOAL #1   Title Pt will be mod I with HEP - 03/16/2015   Status Achieved   OT SHORT TERM GOAL #2   Title Pt will demonstrate ability to complete mid level reach to pick up light object off shelf   Status Partially Met  with mod compensations   OT SHORT TERM GOAL #3   Title Pt will report no greater than 2/10 pain in R shoulder with shoulder flexion to 130* and abduction to 100* in supine.   Status Achieved  140* shoulder flexion/105* abduction with no pain   OT SHORT TERM GOAL #4   Title Pt will demonstrate understanding of strategies to compensate for tying and zipping with AE prn   Status Achieved           OT Long Term Goals - 04/20/15 1250    OT LONG TERM GOAL #1   Title Pt will be mod I with upgraded HEP prn - 05/25/2015   Time 8   Period Weeks   Status On-going   OT LONG TERM GOAL #2   Title Pt will demonstrate ability for 90* of shoulder flexion with assisted reach to obtain light object off shelf with min compensations   Time 6   Period Weeks   Status Revised   OT LONG TERM GOAL #3   Title Pt will demonstrate improved coordination as evidenced by completing 9 hole peg in no more than 1.50 seconds  (baseline= 4 pegs)   Time 6   Period Weeks   Status On-going   OT LONG TERM GOAL #4    Title Pt will be able to write name with AE prn with R hand legibily  Status Achieved   OT LONG TERM GOAL #5   Title Pt will demonstrate ability to use RUE as non dominant for basic ADL' tasks (baseline= gross assist)   Status Achieved   OT LONG TERM GOAL #6   Title Pt will be able to use RUE in cooking task as non dominant for at least 50% of the task.    Status Achieved               Plan - 04/20/15 1250    Clinical Impression Statement Pt reports he likes the splint that it will need to be adjusted (pt forgot splint but will bring next visit). Pt also reports he is using his RUE more and more at home and is much more aware of movement patterns. Pt requiring less assist for mid level reach.   Pt will benefit from skilled therapeutic intervention in order to improve on the following deficits (Retired) Decreased balance;Decreased coordination;Decreased mobility;Decreased range of motion;Decreased strength;Impaired UE functional use;Impaired tone;Impaired flexibility;Pain;Difficulty walking   Rehab Potential Good   Clinical Impairments Affecting Rehab Potential Pt will benefit from skilled OT to address the above deficits.   OT Frequency 2x / week   OT Duration 8 weeks   OT Treatment/Interventions Self-care/ADL training;Moist Heat;DME and/or AE instruction;Neuromuscular education;Therapeutic exercise;Functional Mobility Training;Manual Therapy;Passive range of motion;Therapeutic activities;Balance training;Patient/family education   Plan adjust splint, NMR to address mid level reach with hand function   OT Home Exercise Plan ongoing   Consulted and Agree with Plan of Care Patient        Problem List Patient Active Problem List   Diagnosis Date Noted  . Hearing loss 03/23/2015  . HTN (hypertension) 03/18/2015  . Chronic diastolic heart failure, NYHA class 1 03/18/2015  . Spastic hemiparesis affecting dominant side 07/07/2014  . Right leg swelling 06/09/2014  . Left shoulder  pain 03/16/2014  . Chest pressure 02/03/2014  . Spastic hemiplegia affecting dominant side 11/07/2013  . Medicare annual wellness visit, subsequent 11/06/2013  . Erectile dysfunction due to arterial insufficiency 02/05/2013  . Depression   . Palpitations 06/26/2012  . HLD (hyperlipidemia)   . Restless leg syndrome   . GERD (gastroesophageal reflux disease)   . BPH with obstruction/lower urinary tract symptoms   . History of CVA (cerebrovascular accident) 03/11/2012  . Hemiparesis affecting right side as late effect of stroke 03/11/2012  . Type 2 diabetes, controlled, with neuropathy 03/11/2012  . OSA (obstructive sleep apnea) 03/11/2012  . CKD (chronic kidney disease) stage 3, GFR 30-59 ml/min 03/11/2012  . Migraines 03/11/2012    Quay Burow, OTR/L 04/20/2015, 12:56 PM  [facility] [address] [place], [name], [address] Phone: [phone]   Fax:  [phone]

## 2015-04-20 NOTE — Telephone Encounter (Signed)
Ok to refill 90 day supply to mail order?

## 2015-04-21 ENCOUNTER — Telehealth: Payer: Self-pay | Admitting: Family Medicine

## 2015-04-21 DIAGNOSIS — Z9989 Dependence on other enabling machines and devices: Principal | ICD-10-CM

## 2015-04-21 DIAGNOSIS — G4733 Obstructive sleep apnea (adult) (pediatric): Secondary | ICD-10-CM

## 2015-04-21 MED ORDER — DIAZEPAM 5 MG PO TABS: 5.0000 mg | ORAL_TABLET | Freq: Three times a day (TID) | ORAL | Status: DC | PRN

## 2015-04-21 NOTE — Telephone Encounter (Signed)
jenna from [facility] called in needing order for a cpap machine and supplies.  Please call [phone].  Thanks

## 2015-04-21 NOTE — Telephone Encounter (Signed)
Rx faxed to [facility].

## 2015-04-21 NOTE — Telephone Encounter (Signed)
Printed and in Kim's box 

## 2015-04-22 ENCOUNTER — Ambulatory Visit: Payer: Medicare Other | Admitting: Occupational Therapy

## 2015-04-22 ENCOUNTER — Ambulatory Visit: Payer: Medicare Other | Admitting: Rehabilitative and Restorative Service Providers"

## 2015-04-22 ENCOUNTER — Encounter: Payer: Self-pay | Admitting: Occupational Therapy

## 2015-04-22 DIAGNOSIS — I69951 Hemiplegia and hemiparesis following unspecified cerebrovascular disease affecting right dominant side: Secondary | ICD-10-CM

## 2015-04-22 DIAGNOSIS — R531 Weakness: Secondary | ICD-10-CM

## 2015-04-22 DIAGNOSIS — M6281 Muscle weakness (generalized): Secondary | ICD-10-CM | POA: Diagnosis not present

## 2015-04-22 DIAGNOSIS — IMO0002 Reserved for concepts with insufficient information to code with codable children: Secondary | ICD-10-CM

## 2015-04-22 DIAGNOSIS — R269 Unspecified abnormalities of gait and mobility: Secondary | ICD-10-CM | POA: Diagnosis not present

## 2015-04-22 DIAGNOSIS — I6980 Unspecified sequelae of other cerebrovascular disease: Secondary | ICD-10-CM | POA: Diagnosis not present

## 2015-04-22 DIAGNOSIS — R279 Unspecified lack of coordination: Secondary | ICD-10-CM | POA: Diagnosis not present

## 2015-04-22 NOTE — Telephone Encounter (Signed)
Diana: Please fax cpap supply order to new DME, [facility]. thx

## 2015-04-22 NOTE — Telephone Encounter (Signed)
Will forward request to Dr Rexene Alberts. This is a different company than he was previously using. Will ask [facility] to contact pt's OSA doctor. Form in Kim's box.

## 2015-04-22 NOTE — Therapy (Signed)
[facility] [address] [place], [place], [address] Phone: [phone]   Fax:  [phone]  Occupational Therapy Treatment  Patient Details  Name: James Bell MRN: [id number] Date of Birth: Dec 15, 1930 Referring Provider:  Eustaquio Boyden, MD  Encounter Date: 04/22/2015      OT End of Session - 04/22/15 1205    Visit Number 14   Number of Visits 16   Date for OT Re-Evaluation 04/13/15   Authorization Type MCR trad, tricare - G code needed   OT Start Time 0845   OT Stop Time 0928   OT Time Calculation (min) 43 min   Activity Tolerance Patient tolerated treatment well      Past Medical History  Diagnosis Date  . HLD (hyperlipidemia)   . Restless leg syndrome     well controlled on mirapex  . OSA on CPAP     sleep study 01/2014 - severe, CPAP at 10, previously on nuvigil (Dr. Gareth Morgan)  . Type 2 diabetes, controlled, with neuropathy 2011  . H/O hiatal hernia   . GERD (gastroesophageal reflux disease)     with sliding HH  . Migraines 03/11/12    now rare  . CVA (cerebral infarction) 03/07/12    slurred speech; right hemplegia, left handed - completed PT 07/2014 (17 sessions)  . Basal cell carcinoma of nose 2002    bridge of nose  . History of kidney problems 12/2010    lacerated left kidney after fall  . BPH with obstruction/lower urinary tract symptoms     failed flomax, uroxatral, myrbetriq - sees urology Patsi Sears) pending videourodynamics  . Depression     mild, on cymbalta per prior PCP  . Palpitations 01/2012    holter WNL, rare PAC/PVCs  . Erectile dysfunction     per urology (prostaglandin injection)  . Kidney cysts 02/2014    bilateral (renal US)  . CKD (chronic kidney disease) stage 3, GFR 30-59 ml/min 03/11/2012    Renal US 02/2014 - multiple kidney cysts   . TIA (transient ischemic attack)   . Anemia     Past Surgical History  Procedure Laterality Date  . Prostate surgery  02/2009     PVP (laser)  . Tonsillectomy and adenoidectomy  ~ 1945  . Hemorrhoid surgery  1991  . Cataract extraction w/ intraocular lens implant  11//2012 - 08/2011  . Inguinal hernia repair  1981; 2002    left, then repaired  . Skin cancer excision  2002    bridge of nose, BCC  . Colonoscopy  05/2011    normal per pt (Dr. Pearlean Brownie, [place], [place])  . Mri brain  04/2010    WNL  . Esophagogastroduodenoscopy  2006    duodenitis, medual HH, Grade 2 reflux, mild gastritis  . Colonoscopy  03/2011    poor prep, unable to biopsy polyp in tic fold, rpt 3 mo Pearlean Brownie)  . Abi  2007    WNL  . Urethral dilation  2014    tannenbaum  . Cystoscopy with urethral dilatation N/A 07/24/2013    Procedure: CYSTOSCOPY WITH URETHRAL DILATATION;  Surgeon: Kathi Ludwig, MD;  Location: [facility];  Service: Urology;  Laterality: N/A;  . Transurethral incision of prostate N/A 07/24/2013    Procedure: TRANSURETHRAL INCISION OF THE PROSTATE (TUIP);  Surgeon: Kathi Ludwig, MD;  Location: [facility];  Service: Urology;  Laterality: N/A;  . Esophagogastroduodenoscopy  07/2011    mild chronic gastritis  . Colonoscopy  06/2011    mult polyps adenomatous, severe diverticulosis, rpt 3 yrs [name])  . Carotid endarterectomy    . Cardiac catheterization  2004    normal; [facility]  . Cardiovascular stress test  02/2014    WNL, low risk scan, EF 68%    There were no vitals filed for this visit.  Visit Diagnosis:  Hemiplegia affecting right side in right-dominant patient as late effect of cerebrovascular disease  Lack of coordination due to stroke      Subjective Assessment - 04/22/15 0854    Subjective  I remembered my splint today   Pertinent History see epic snapshot;  pt with H/o of CVA   Patient Stated Goals I have made alot of progress but would like to be able to do even more.   Currently in Pain? No/denies                      OT Treatments/Exercises (OP) - 04/22/15 0001    Neurological  Re-education Exercises   Other Exercises 1 Neuro re ed to address mid to high reach in both supine and sitting with emphasis on reducing tone, improving alignment and improving hand function with low to mid reach.  Pt requiring no facilitation in supine and min facilitation at end range in sitting. Pt reports for the first time using RUE on stick shift in car and also reports that he is using his RUE more spontaneously at home.   Splinting   Splinting Modifed resting hand splint as pt reported some pressure at pinky finger as well as adjusted strapping to better facilitate use of straps. Reinforced that strapping does not need to be overly tight just tight enough to hold splint on. Pt able to don and doff independently.                  OT Short Term Goals - 04/22/15 1155    OT SHORT TERM GOAL #1   Title Pt will be mod I with HEP - 03/16/2015   Status Achieved   OT SHORT TERM GOAL #2   Title Pt will demonstrate ability to complete mid level reach to pick up light object off shelf   Status Partially Met  with mod compensations   OT SHORT TERM GOAL #3   Title Pt will report no greater than 2/10 pain in R shoulder with shoulder flexion to 130* and abduction to 100* in supine.   Status Achieved  140* shoulder flexion/105* abduction with no pain   OT SHORT TERM GOAL #4   Title Pt will demonstrate understanding of strategies to compensate for tying and zipping with AE prn   Status Achieved           OT Long Term Goals - 04/22/15 1155    OT LONG TERM GOAL #1   Title Pt will be mod I with upgraded HEP prn - 05/25/2015   Time 8   Period Weeks   Status On-going   OT LONG TERM GOAL #2   Title Pt will demonstrate ability for 90* of shoulder flexion with assisted reach to obtain light object off shelf with min compensations   Time 6   Period Weeks   Status On-going   OT LONG TERM GOAL #3   Title Pt will demonstrate improved coordination as evidenced by completing 9 hole peg in no  more than 1.50 seconds  (baseline= 4 pegs)   Time 6   Period Weeks   Status On-going  OT LONG TERM GOAL #4   Title Pt will be able to write name with AE prn with R hand legibily   Status Achieved   OT LONG TERM GOAL #5   Title Pt will demonstrate ability to use RUE as non dominant for basic ADL' tasks (baseline= gross assist)   Status Achieved   OT LONG TERM GOAL #6   Title Pt will be able to use RUE in cooking task as non dominant for at least 50% of the task.    Status Achieved               Plan - 04/22/15 1201    Clinical Impression Statement Pt demonstrating improved proximal control with RUE and reports that he is using RUE more spontaneously at home with functional tasks. Pt continues to struggle with hand function unilateral tasks that require more fine motor.    Pt will benefit from skilled therapeutic intervention in order to improve on the following deficits (Retired) Decreased balance;Decreased coordination;Decreased mobility;Decreased range of motion;Decreased strength;Impaired UE functional use;Impaired tone;Impaired flexibility;Pain;Difficulty walking   Rehab Potential Good   Clinical Impairments Affecting Rehab Potential Pt will benefit from skilled OT to address the above deficits.   OT Frequency 2x / week   OT Duration 8 weeks   OT Treatment/Interventions Self-care/ADL training;Moist Heat;DME and/or AE instruction;Neuromuscular education;Therapeutic exercise;Functional Mobility Training;Manual Therapy;Passive range of motion;Therapeutic activities;Balance training;Patient/family education   Plan NMR RUE for reach and fine motor   OT Home Exercise Plan ongoing   Consulted and Agree with Plan of Care Patient        Problem List Patient Active Problem List   Diagnosis Date Noted  . Hearing loss 03/23/2015  . HTN (hypertension) 03/18/2015  . Chronic diastolic heart failure, NYHA class 1 03/18/2015  . Spastic hemiparesis affecting dominant side 07/07/2014   . Right leg swelling 06/09/2014  . Left shoulder pain 03/16/2014  . Chest pressure 02/03/2014  . Spastic hemiplegia affecting dominant side 11/07/2013  . Medicare annual wellness visit, subsequent 11/06/2013  . Erectile dysfunction due to arterial insufficiency 02/05/2013  . Depression   . Palpitations 06/26/2012  . HLD (hyperlipidemia)   . Restless leg syndrome   . GERD (gastroesophageal reflux disease)   . BPH with obstruction/lower urinary tract symptoms   . History of CVA (cerebrovascular accident) 03/11/2012  . Hemiparesis affecting right side as late effect of stroke 03/11/2012  . Type 2 diabetes, controlled, with neuropathy 03/11/2012  . OSA (obstructive sleep apnea) 03/11/2012  . CKD (chronic kidney disease) stage 3, GFR 30-59 ml/min 03/11/2012  . Migraines 03/11/2012    Quay Burow, OTR/L 04/22/2015, 12:07 PM  [facility] [address] [place], [name], [address] Phone: [phone]   Fax:  [phone]

## 2015-04-22 NOTE — Therapy (Signed)
[facility] [address] [place], [name], [address] Phone: [phone]   Fax:  [phone]  Physical Therapy Treatment  Patient Details  Name: DAVIDMICHAEL ZARAZUA MRN: [id number] Date of Birth: [birth year] Referring Provider:  [name], MD  Encounter Date: 04/22/2015      PT End of Session - 04/22/15 1007    Visit Number 10   Number of Visits 12   Date for PT Re-Evaluation 04/30/15   Authorization Type G-code required every 10th visit for Medicare.   PT Start Time [phone]   PT Stop Time 1015   PT Time Calculation (min) 41 min   Equipment Utilized During Treatment Gait belt   Activity Tolerance Patient tolerated treatment well   Behavior During Therapy WFL for tasks assessed/performed      Past Medical History  Diagnosis Date  . HLD (hyperlipidemia)   . Restless leg syndrome     well controlled on mirapex  . OSA on CPAP     sleep study 01/2014 - severe, CPAP at 10, previously on nuvigil (Dr. [name])  . Type 2 diabetes, controlled, with neuropathy 2011  . H/O hiatal hernia   . GERD (gastroesophageal reflux disease)     with sliding HH  . Migraines 03/11/12    now rare  . CVA (cerebral infarction) 03/07/12    slurred speech; right hemplegia, left handed - completed PT 07/2014 (17 sessions)  . Basal cell carcinoma of nose 2002    bridge of nose  . History of kidney problems 12/2010    lacerated left kidney after fall  . BPH with obstruction/lower urinary tract symptoms     failed flomax, uroxatral, myrbetriq - sees urology [name]) pending videourodynamics  . Depression     mild, on cymbalta per prior PCP  . Palpitations 01/2012    holter WNL, rare PAC/PVCs  . Erectile dysfunction     per urology (prostaglandin injection)  . Kidney cysts 02/2014    bilateral (renal US)  . CKD (chronic kidney disease) stage 3, GFR 30-59 ml/min 03/11/2012    Renal US 02/2014 - multiple kidney cysts   . TIA  (transient ischemic attack)   . Anemia     Past Surgical History  Procedure Laterality Date  . Prostate surgery  02/2009    PVP (laser)  . Tonsillectomy and adenoidectomy  ~ 1945  . Hemorrhoid surgery  1991  . Cataract extraction w/ intraocular lens implant  11//2012 - 08/2011  . Inguinal hernia repair  1981; 2002    left, then repaired  . Skin cancer excision  2002    bridge of nose, [facility]  . Colonoscopy  05/2011    normal per pt (Dr. [name], [place], [name])  . Mri brain  04/2010    WNL  . Esophagogastroduodenoscopy  2006    duodenitis, medual HH, Grade 2 reflux, mild gastritis  . Colonoscopy  03/2011    poor prep, unable to biopsy polyp in tic fold, rpt 3 mo [name])  . Abi  2007    WNL  . Urethral dilation  2014    tannenbaum  . Cystoscopy with urethral dilatation N/A 07/24/2013    Procedure: CYSTOSCOPY WITH URETHRAL DILATATION;  Surgeon: [name], MD;  Location: [facility];  Service: Urology;  Laterality: N/A;  . Transurethral incision of prostate N/A 07/24/2013    Procedure: TRANSURETHRAL INCISION OF THE PROSTATE (TUIP);  Surgeon: [name], MD;  Location: [facility];  Service: Urology;  Laterality: N/A;  . Esophagogastroduodenoscopy  07/2011    mild chronic gastritis  . Colonoscopy  06/2011    mult polyps adenomatous, severe diverticulosis, rpt 3 yrs [name])  . Carotid endarterectomy    . Cardiac catheterization  2004    normal; [facility]  . Cardiovascular stress test  02/2014    WNL, low risk scan, EF 68%    There were no vitals filed for this visit.  Visit Diagnosis:  Abnormality of gait  Generalized weakness      Subjective Assessment - 04/22/15 0934    Subjective Patient wants to get a handle on what he should do for discharge HEP.   Patient Stated Goals Return to walking more confidently and improve balance.   Currently in Pain? No/denies            [facility] PT Assessment - 04/22/15 1011    Standardized Balance Assessment   Standardized  Balance Assessment Berg Balance Test   Berg Balance Test   Sit to Stand Able to stand without using hands and stabilize independently   Standing Unsupported Able to stand safely 2 minutes   Sitting with Back Unsupported but Feet Supported on Floor or Stool Able to sit safely and securely 2 minutes   Stand to Sit Sits safely with minimal use of hands   Transfers Able to transfer safely, minor use of hands   Standing Unsupported with Eyes Closed Able to stand 10 seconds safely   Standing Ubsupported with Feet Together Able to place feet together independently and stand 1 minute safely   From Standing, Reach Forward with Outstretched Arm Can reach confidently >25 cm (10")   From Standing Position, Pick up Object from Floor Able to pick up shoe safely and easily   From Standing Position, Turn to Look Behind Over each Shoulder Looks behind one side only/other side shows less weight shift   Turn 360 Degrees Able to turn 360 degrees safely but slowly   Standing Unsupported, Alternately Place Feet on Step/Stool Able to complete >2 steps/needs minimal assist   Standing Unsupported, One Foot in Front Able to take small step independently and hold 30 seconds   Standing on One Leg Tries to lift leg/unable to hold 3 seconds but remains standing independently   Total Score 45      THERAPEUTIC EXERCISE: Reviewed HEP per patient instructions with addition of blue theraband to clam shell exercise. Discussed ways to improve compliance with HEP and barriers to performing.   Discussed fall prevention for home during there ex today.  Patient verbalizes understanding.     PT Education - 04/22/15 1006    Education provided Yes   Education Details REviewed all HEP and divided activities into 2 groups to try to increase compliance based on patient's needs.  Added blue theraband to clam shell ex   Person(s) Educated Patient   Methods Explanation;Demonstration;Handout   Comprehension Returned  demonstration;Verbalized understanding          PT Short Term Goals - 04/06/15 0936    PT SHORT TERM GOAL #1   Title Pt will be independent in HEP to improve strength and functional mobility.    Baseline Pt doing some HEP, however PT to further review.   Time 4   Period Weeks   Status Partially Met   PT SHORT TERM GOAL #2   Title Pt will improve Berg from 42/56 up to > or equal to 45/56 to demo decreasing risk for falls.   Baseline Met on  04/06/2015 scoring 46/56   Time 4   Period Weeks   Status Achieved   PT SHORT TERM GOAL #3   Title The patient will increase gait speed from 1.85 ft/sec up to 2.2 ft/sec to demo improving gait speed for functional mobility.   Baseline Partially met, with patient improving to 2.01 ft/sec on 04/06/2015.   Time 4   Period Weeks   Status Partially Met   PT SHORT TERM GOAL #4   Title Pt will ambulate > or equal to 400 ft nonstop on level/unlevel surfaces with LRAD modified indep to demo improving endurance.   Baseline Patient ambulates 400 ft, however required supervision when fatigued due to narrowing base of support.   Time 4   Period Weeks   Status Partially Met           PT Long Term Goals - 04/22/15 1008    PT LONG TERM GOAL #1   Title Pt will be able to verbalize understanding of falls prevention strategies to reduce risk of falls.    Baseline Met on 04/22/2015- patient reports having automatic light sensor indoors for lights to come on, decreased throw rugs, use of devices when tired.   Time 8   Period Weeks   Status Achieved   PT LONG TERM GOAL #2   Title Patient will improve Berg from 42/56 up to > or equal to 49/56 to demo decreased risk for fall.   Baseline Patient scores 45/56 on 04/22/2015.  Anticipate this is patient's top level for performance.   Time 8   Period Weeks   Status Partially Met   PT LONG TERM GOAL #3   Title The patient will increase gait speed from 1.85 ft/sec up to 2.6 ft/sec to demo improving functional  mobility.   Baseline Target date 04/30/2015   Time 8   Period Weeks   Status On-going   PT LONG TERM GOAL #4   Title The patient will ambulate for 10 minutes nonstop to demo improved endurance for functional mobility.   Baseline Goal met on 04/20/15 with patient walking for 10+ minutes.  He had one loss of balance requiring assist to recover when using SBQC.   Time 8   Period Weeks   Status Achieved   PT LONG TERM GOAL #5   Title Pt will verbalize understanding of HEP for post d/c activities.   Baseline Met on 04/20/2015 with patient with fitness center routine and HEP written.   Time 8   Period Weeks   Status Achieved   PT LONG TERM GOAL #6   Title Pt will improve ABC scale from 53.8% up to 68%.   Baseline Target date 04/30/2015   Time 8   Period Weeks   Status On-going               Plan - 04/22/15 1440    Clinical Impression Statement The patient is demonstrating levelling off of progress on Berg, which supports upcoming d/c from PT.  At today's session, PT reviewed all HEP and divided into 2 groups based on patient's requests for improved compliance.  Plan to d/c next week.   Pt will benefit from skilled therapeutic intervention in order to improve on the following deficits Abnormal gait;Decreased endurance;Decreased balance;Decreased mobility;Decreased strength;Decreased knowledge of use of DME;Decreased range of motion;Impaired flexibility;Impaired UE functional use;Postural dysfunction   Rehab Potential Good   PT Frequency 2x / week   PT Duration 4 weeks   PT Treatment/Interventions ADLs/Self Care Home Management;DME  Instruction;Balance training;Manual techniques;Therapeutic exercise;Therapeutic activities;Patient/family education;Functional mobility training;Stair training;Gait training;Neuromuscular re-education   PT Next Visit Plan Check remaining goals, d/c plan and possibly d/c next visit.   Consulted and Agree with Plan of Care Patient          G-Codes - 05-03-15  1327    Functional Assessment Tool Used Berg=45/56   Functional Limitation Mobility: Walking and moving around   Mobility: Walking and Moving Around Goal Status [phone]) At least 1 percent but less than 20 percent impaired, limited or restricted   Mobility: Walking and Moving Around Discharge Status [phone]) At least 1 percent but less than 20 percent impaired, limited or restricted      Problem List Patient Active Problem List   Diagnosis Date Noted  . Hearing loss 03/23/2015  . HTN (hypertension) 03/18/2015  . Chronic diastolic heart failure, NYHA class 1 03/18/2015  . Spastic hemiparesis affecting dominant side 07/07/2014  . Right leg swelling 06/09/2014  . Left shoulder pain 03/16/2014  . Chest pressure 02/03/2014  . Spastic hemiplegia affecting dominant side 11/07/2013  . Medicare annual wellness visit, subsequent 11/06/2013  . Erectile dysfunction due to arterial insufficiency 02/05/2013  . Depression   . Palpitations 06/26/2012  . HLD (hyperlipidemia)   . Restless leg syndrome   . GERD (gastroesophageal reflux disease)   . BPH with obstruction/lower urinary tract symptoms   . History of CVA (cerebrovascular accident) 03/11/2012  . Hemiparesis affecting right side as late effect of stroke 03/11/2012  . Type 2 diabetes, controlled, with neuropathy 03/11/2012  . OSA (obstructive sleep apnea) 03/11/2012  . CKD (chronic kidney disease) stage 3, GFR 30-59 ml/min 03/11/2012  . Migraines 03/11/2012   Physical Therapy Progress Note  Dates of Reporting Period: 03/03/2015 to 05-03-2015  Objective Reports of Subjective Statement:  Patient performing greater activity in his home.  He is using w/c less for household mobility.  Objective Measurements: Berg=45/56, gait speed=2.01 ft/sec, patient ambulates 10 min nonstop.  Goal Update: See goals above  Plan: See plan above  Reason Skilled Services are Required: Plan to d/c next week.  PT to focus on carryover of HEP, safety with gait,  and further balance training x 1 more week.   [place], [employer] 05-03-2015, 2:43 PM  [facility] [address] [place], [name], [address] Phone: [phone]   Fax:  [phone]

## 2015-04-22 NOTE — Telephone Encounter (Signed)
Faxed in order today.

## 2015-04-22 NOTE — Patient Instructions (Signed)
Perform in a corner with a chair in front of you for safey:  Feet Apart, Head Motion - Eyes Closed   With eyes closed and feet shoulder width apart, move head slowly, up and down and side to side for 30 seconds. Repeat __3__ times per session. Do __1__ sessions per day.  Copyright  VHI. All rights reserved.  Feet Heel-Toe "Tandem", Varied Arm Positions - Eyes Open   With eyes open, right foot directly in front of the other, arms at your side, look straight ahead at a stationary object. Hold _15-30___ seconds. Repeat with other leg in front. Repeat __3__ times per session. Do __1__ sessions per day.  Copyright  VHI. All rights reserved.  Single Leg - Eyes Open   Holding support, lift right leg while maintaining balance over other leg. Progress to removing hands from support surface for longer periods of time. Hold_10-30___ seconds. Repeat with other leg. Repeat __3__ times per session. Do __12__ sessions per day.  Copyright  VHI. All rights reserved.  Weight Shift: Anterior / Posterior (Limits of Stability)   Slowly shift weight backward until toes begin to rise off floor. Return to starting position. Shift weight slowly forward until heels begin to rise off floor. Hold each position __2__ seconds. Repeat _20___ times per session. Do __1__ sessions per day.   Copyright  VHI. All rights reserved.  Marching in Place: Varied Surfaces   March in place, slowly lifting knees toward ceiling, 10 times per leg. Repeat __1__ times per session. Do __1__ sessions per day.   Copyright  VHI. All rights reserved.    Functional Quadriceps: Sit to Stand   Sit on edge of chair(bench), feet flat on floor. Stand upright, extending knees fully. Repeat _10___ times per set. Do __1__ sets per session. Do __1-2__ sessions per day.  http://orth.exer.us/734   Copyright  VHI. All rights reserved.   Abduction: Clam (Eccentric) - Side-Lying   Lie on side with knees bent, make sure  your hips and trunk stay forward. Place pillow behind back to stay forward if needed. Lift right knee, keeping feet together. Keep trunk steady. Slowly lower. Perform _10__ reps per set, _3__ sets per day, _3-4__ days per week. Repeat with other leg. Copyright  VHI. All rights reserved.   Hamstring Stretch, Seated (Strap, Two Chairs)   Sit with one leg extended onto facing chair. NO LOOP/STRAP FOR NOW. Lengthen spine. Hold for _20-30 seconds. Repeat __3__ times each leg.        LIST OF EXERCISES/ACTIVITIES: GROUP 1 IN THE CORNER  1) Eyes closed, feet apart with head turns   2) Feet heel to toe standing 3) Weight shifting front to back with wall behind you BED 1) Clam Shell  GROUP 2 STANDING AT COUNTER 1) Standing on one leg 2) Marching CHAIR 1) Sit to stand 2) Hamstring stretch

## 2015-04-23 ENCOUNTER — Ambulatory Visit (INDEPENDENT_AMBULATORY_CARE_PROVIDER_SITE_OTHER): Payer: Medicare Other | Admitting: Family Medicine

## 2015-04-23 ENCOUNTER — Encounter: Payer: Self-pay | Admitting: Family Medicine

## 2015-04-23 VITALS — BP 122/66 | HR 76 | Temp 98.0°F | Ht 68.5 in | Wt 184.5 lb

## 2015-04-23 DIAGNOSIS — Z7409 Other reduced mobility: Secondary | ICD-10-CM | POA: Diagnosis not present

## 2015-04-23 DIAGNOSIS — I639 Cerebral infarction, unspecified: Secondary | ICD-10-CM

## 2015-04-23 NOTE — Progress Notes (Signed)
Pre visit review using our clinic review tool, if applicable. No additional management support is needed unless otherwise documented below in the visit note. 

## 2015-04-23 NOTE — Patient Instructions (Addendum)
Check height today. I recommend use rollator walker (prescription provided today to take to durable equipment store. We will continue to monitor need for higher level of ambulatory assistance. If you notice a higher need let me know.

## 2015-04-23 NOTE — Progress Notes (Addendum)
BP 122/66 mmHg  Pulse 76  Temp(Src) 98 F (36.7 C) (Oral)  Ht 5' 8.5" (1.74 m)  Wt 184 lb 8 oz (83.689 kg)  BMI 27.64 kg/m2   CC: mobility examination  Subjective:    Patient ID: James Bell, male    DOB: March 05, 1931, 81 y.o.   MRN: [id number]  HPI: James Bell is a 81 y.o. male presenting on 04/23/2015 for Follow-up   Retired right handed ophthalmologist with history of residual right hemiparesis after moderate L post basal ganglia and periventricular white matter stroke 2/2 small vessel disease (02/2012), compliant with plavix daily. Currently using quad cane and attending PT/OT at [facility] (sees Rudell Cobb).   Mainly uses cane for ambulation. Able to use walker with handles and seat well.  Uses walker at home. Also uses wheelchair at home - propels himself with legs, unable to do this with arms 2/2 hemiparesis.   PT has told him he is unsteady with ambulation.   House set up - has 6 inch step from garage into house. He has had 2 falls when going into house. He has ramp at front entrance.   Uses R foot brace to prevent foot drop.  No h/o decubitus ulcers.   Needs to use electric cart to shop. [employer] and [employer] have electronic carts. [employer] does not.  Relevant past medical, surgical, family and social history reviewed and updated as indicated. Interim medical history since our last visit reviewed. Allergies and medications reviewed and updated. Current Outpatient Prescriptions on File Prior to Visit  Medication Sig  . atorvastatin (LIPITOR) 40 MG tablet TAKE 1 TABLET DAILY  . chlorpheniramine-HYDROcodone (TUSSIONEX PENNKINETIC ER) 10-8 MG/5ML SUER Take 5 mLs by mouth at bedtime as needed for cough.  . clopidogrel (PLAVIX) 75 MG tablet TAKE 1 TABLET DAILY  . Coenzyme Q10 (COQ10) 150 MG CAPS Take 1 capsule by mouth 2 (two) times daily.  . diazepam (VALIUM) 5 MG tablet Take 1 tablet (5 mg total) by mouth every 8 (eight) hours as  needed for muscle spasms.  . DULoxetine (CYMBALTA) 60 MG capsule TAKE 1 CAPSULE DAILY  . Folic Acid-Vit V5-IEP P29 (FOLBEE) 2.5-25-1 MG TABS tablet Take 1 tablet by mouth daily.  Marland Kitchen ibuprofen (ADVIL,MOTRIN) 200 MG tablet Take 400 mg by mouth every 6 (six) hours as needed for mild pain or moderate pain.  Marland Kitchen losartan (COZAAR) 25 MG tablet Take 1 tablet (25 mg total) by mouth daily.  . metoprolol tartrate (LOPRESSOR) 25 MG tablet Take 0.5 tablets (12.5 mg total) by mouth 2 (two) times daily.  Marland Kitchen NITROSTAT 0.4 MG SL tablet DISSOLVE 1 TABLET UNDER THE TONGUE EVERY 5 MINUTES AS NEEDED FOR CHEST PAIN  . NOVOFINE 30G X 8 MM MISC USE ONE DAILY AS DIRECTED  . omega-3 acid ethyl esters (LOVAZA) 1 G capsule TAKE 2 CAPSULES (2 GRAMS TOTAL) TWICE A DAY  . pantoprazole (PROTONIX) 40 MG tablet Take 1 tablet (40 mg total) by mouth 2 (two) times daily as needed.  . pramipexole (MIRAPEX) 1 MG tablet TAKE 1 TABLET AT BEDTIME  . sitaGLIPtin-metformin (JANUMET) 50-1000 MG per tablet Take 1 tablet by mouth 2 (two) times daily with a meal.  . traZODone (DESYREL) 50 MG tablet Take 25 mg by mouth at bedtime as needed.  . vitamin C (ASCORBIC ACID) 500 MG tablet Take 1,000 mg by mouth daily.    No current facility-administered medications on file prior to visit.    Review of  Systems Per HPI unless specifically indicated above    Objective:    BP 122/66 mmHg  Pulse 76  Temp(Src) 98 F (36.7 C) (Oral)  Ht 5' 8.5" (1.74 m)  Wt 184 lb 8 oz (83.689 kg)  BMI 27.64 kg/m2  Wt Readings from Last 3 Encounters:  04/23/15 184 lb 8 oz (83.689 kg)  03/30/15 184 lb 6.4 oz (83.643 kg)  03/23/15 185 lb 12 oz (84.256 kg)    Physical Exam  Constitutional: He is oriented to person, place, and time. He appears well-developed and well-nourished. No distress.  Eyes: Conjunctivae and EOM are normal. Pupils are equal, round, and reactive to light. No scleral icterus.  Neck: Normal range of motion. Neck supple.  Musculoskeletal: He  exhibits no edema.  FROM at neck L shoulder - FROM R shoulder - ROM limited by weakness, unable to lift arm over 90 degrees  Lymphadenopathy:    He has no cervical adenopathy.  Neurological: He is alert and oriented to person, place, and time. No cranial nerve deficit or sensory deficit. Gait (Slowed gait using cane, R foot drop) abnormal.  L upper and lower extremity 5/5 strength R lower extremity 5/5 strength  R upper extremity 4+/5 strength  Skin: Skin is warm and dry. No rash noted.  Psychiatric: He has a normal mood and affect.  Nursing note and vitals reviewed.     Assessment & Plan:   Problem List Items Addressed This Visit    Impaired mobility - Primary    Pt comes in to discuss motorized ambulatory assistive device, however he is mostly able to ambulate well with assistance of walker. Main concern of patient is that he easily tires when ambulating moderate distances ie when he goes shopping. He feels he would do well with a mobile seat to rest so I have recommended we try a rollator walker with seat. He does have persistent right sided hemiparesis worse upper extremity. In the future he may qualify for motorized power device but I feel that he currently does not. We did discuss importance of continued exercise and use of his muscles in order to keep current level of function. I will also touch base with his physical therapist to ensure there aren't any other concerns or disabilities that would change his status.          Follow up plan: No Follow-up on file.    Touched base with PT - agrees with my recommendations. Does not qualify at this time for power w/c because he is able to ambulate 10 min straight. rec RW with seat.

## 2015-04-24 NOTE — Assessment & Plan Note (Addendum)
Pt comes in to discuss motorized ambulatory assistive device, however he is mostly able to ambulate well with assistance of walker. Main concern of patient is that he easily tires when ambulating moderate distances ie when he goes shopping. He feels he would do well with a mobile seat to rest so I have recommended we try a rollator walker with seat. He does have persistent right sided hemiparesis worse upper extremity. In the future he may qualify for motorized power device but I feel that he currently does not. We did discuss importance of continued exercise and use of his muscles in order to keep current level of function. I will also touch base with his physical therapist to ensure there aren't any other concerns or disabilities that would change his status.

## 2015-04-27 ENCOUNTER — Ambulatory Visit: Payer: Medicare Other | Admitting: Occupational Therapy

## 2015-04-27 ENCOUNTER — Ambulatory Visit: Payer: Medicare Other | Admitting: Rehabilitative and Restorative Service Providers"

## 2015-04-27 ENCOUNTER — Encounter: Payer: Self-pay | Admitting: Occupational Therapy

## 2015-04-27 ENCOUNTER — Telehealth: Payer: Self-pay | Admitting: Neurology

## 2015-04-27 DIAGNOSIS — M6281 Muscle weakness (generalized): Secondary | ICD-10-CM | POA: Diagnosis not present

## 2015-04-27 DIAGNOSIS — R531 Weakness: Secondary | ICD-10-CM

## 2015-04-27 DIAGNOSIS — R269 Unspecified abnormalities of gait and mobility: Secondary | ICD-10-CM

## 2015-04-27 DIAGNOSIS — I6980 Unspecified sequelae of other cerebrovascular disease: Secondary | ICD-10-CM | POA: Diagnosis not present

## 2015-04-27 DIAGNOSIS — I69951 Hemiplegia and hemiparesis following unspecified cerebrovascular disease affecting right dominant side: Secondary | ICD-10-CM | POA: Diagnosis not present

## 2015-04-27 DIAGNOSIS — IMO0002 Reserved for concepts with insufficient information to code with codable children: Secondary | ICD-10-CM

## 2015-04-27 DIAGNOSIS — R279 Unspecified lack of coordination: Secondary | ICD-10-CM | POA: Diagnosis not present

## 2015-04-27 NOTE — Therapy (Signed)
[facility] [address] [place], [name], [address] Phone: [phone]   Fax:  [phone]  Occupational Therapy Treatment  Patient Details  Name: James Bell MRN: [id number] Date of Birth: 1931-03-26 Referring Provider:  [name], MD  Encounter Date: 04/27/2015      OT End of Session - 04/27/15 0935    Visit Number 15   Number of Visits 16   Date for OT Re-Evaluation 04/13/15   Authorization Type MCR trad, tricare - G code needed   OT Start Time 0845   OT Stop Time 0928   OT Time Calculation (min) 43 min   Activity Tolerance Patient tolerated treatment well      Past Medical History  Diagnosis Date  . HLD (hyperlipidemia)   . Restless leg syndrome     well controlled on mirapex  . OSA on CPAP     sleep study 01/2014 - severe, CPAP at 10, previously on nuvigil (Dr. [name])  . Type 2 diabetes, controlled, with neuropathy 2011  . H/O hiatal hernia   . GERD (gastroesophageal reflux disease)     with sliding HH  . Migraines 03/11/12    now rare  . CVA (cerebral infarction) 03/07/12    slurred speech; right hemplegia, left handed - completed PT 07/2014 (17 sessions)  . Basal cell carcinoma of nose 2002    bridge of nose  . History of kidney problems 12/2010    lacerated left kidney after fall  . BPH with obstruction/lower urinary tract symptoms     failed flomax, uroxatral, myrbetriq - sees urology [name]) pending videourodynamics  . Depression     mild, on cymbalta per prior PCP  . Palpitations 01/2012    holter WNL, rare PAC/PVCs  . Erectile dysfunction     per urology (prostaglandin injection)  . Kidney cysts 02/2014    bilateral (renal US)  . CKD (chronic kidney disease) stage 3, GFR 30-59 ml/min 03/11/2012    Renal US 02/2014 - multiple kidney cysts   . TIA (transient ischemic attack)   . Anemia     Past Surgical History  Procedure Laterality Date  . Prostate surgery  02/2009     PVP (laser)  . Tonsillectomy and adenoidectomy  ~ 1945  . Hemorrhoid surgery  1991  . Cataract extraction w/ intraocular lens implant  11//2012 - 08/2011  . Inguinal hernia repair  1981; 2002    left, then repaired  . Skin cancer excision  2002    bridge of nose, [facility]  . Colonoscopy  05/2011    normal per pt (Dr. [name], [place], [name])  . Mri brain  04/2010    WNL  . Esophagogastroduodenoscopy  2006    duodenitis, medual HH, Grade 2 reflux, mild gastritis  . Colonoscopy  03/2011    poor prep, unable to biopsy polyp in tic fold, rpt 3 mo [name])  . Abi  2007    WNL  . Urethral dilation  2014    tannenbaum  . Cystoscopy with urethral dilatation N/A 07/24/2013    Procedure: CYSTOSCOPY WITH URETHRAL DILATATION;  Surgeon: [name], MD;  Location: [facility];  Service: Urology;  Laterality: N/A;  . Transurethral incision of prostate N/A 07/24/2013    Procedure: TRANSURETHRAL INCISION OF THE PROSTATE (TUIP);  Surgeon: [name], MD;  Location: [facility];  Service: Urology;  Laterality: N/A;  . Esophagogastroduodenoscopy  07/2011    mild chronic gastritis  . Colonoscopy  06/2011    mult polyps adenomatous, severe diverticulosis, rpt 3 yrs [name])  . Carotid endarterectomy    . Cardiac catheterization  2004    normal; [facility]  . Cardiovascular stress test  02/2014    WNL, low risk scan, EF 68%    There were no vitals filed for this visit.  Visit Diagnosis:  Hemiplegia affecting right side in right-dominant patient as late effect of cerebrovascular disease  Lack of coordination due to stroke  Muscle weakness      Subjective Assessment - 04/27/15 0851    Subjective  I know you explained this but help me understand the purpose of the splint   Pertinent History see epic snapshot;  pt with H/o of CVA   Patient Stated Goals I have made alot of progress but would like to be able to do even more.   Currently in Pain? No/denies                       OT Treatments/Exercises (OP) - 04/27/15 0001    Neurological Re-education Exercises   Other Exercises 1 Neuro re ed to address mid to beginning high reach with weighted resistive activities in supine initially - pt with much improved ability to complete with very little compensation/flexor synergy influence and able to actively relax hand for more isolated true grip. Procressed to sitting and then standing activities. Pt able to reach repeatedly to 90* with self assisted reach in standing and retrieve and replace light objects of varying sizes and shapes. Pt also able to carry half full glass of water greater than 300 feet without spills.                  OT Short Term Goals - 04/27/15 0933    OT SHORT TERM GOAL #1   Title Pt will be mod I with HEP - 03/16/2015   Status Achieved   OT SHORT TERM GOAL #2   Title Pt will demonstrate ability to complete mid level reach to pick up light object off shelf   Status Partially Met  with mod compensations   OT SHORT TERM GOAL #3   Title Pt will report no greater than 2/10 pain in R shoulder with shoulder flexion to 130* and abduction to 100* in supine.   Status Achieved  140* shoulder flexion/105* abduction with no pain   OT SHORT TERM GOAL #4   Title Pt will demonstrate understanding of strategies to compensate for tying and zipping with AE prn   Status Achieved           OT Long Term Goals - 04/27/15 0934    OT LONG TERM GOAL #1   Title Pt will be mod I with upgraded HEP prn - 05/25/2015   Time 8   Period Weeks   Status Achieved   OT LONG TERM GOAL #2   Title Pt will demonstrate ability for 90* of shoulder flexion with assisted reach to obtain light object off shelf with min compensations   Time 6   Period Weeks   Status Achieved   OT LONG TERM GOAL #3   Title Pt will demonstrate improved coordination as evidenced by completing 9 hole peg in no more than 1.50 seconds  (baseline= 4 pegs)   Time 6   Period Weeks   Status  On-going   OT LONG TERM GOAL #4   Title Pt will be able to write name with AE prn with R hand  legibily   Status Achieved   OT LONG TERM GOAL #5   Title Pt will demonstrate ability to use RUE as non dominant for basic ADL' tasks (baseline= gross assist)   Status Achieved   OT LONG TERM GOAL #6   Title Pt will be able to use RUE in cooking task as non dominant for at least 50% of the task.    Status Achieved               Plan - 04/27/15 0934    Clinical Impression Statement Pt has made signficant progress and has met all but one LTG. Pt states he is extremely pleased with progress and is now able to use his functionally for at least 70% of his day.   Pt will benefit from skilled therapeutic intervention in order to improve on the following deficits (Retired) Decreased balance;Decreased coordination;Decreased mobility;Decreased range of motion;Decreased strength;Impaired UE functional use;Impaired tone;Impaired flexibility;Pain;Difficulty walking   Rehab Potential Good   Clinical Impairments Affecting Rehab Potential Pt will benefit from skilled OT to address the above deficits.   OT Frequency 2x / week   OT Duration 8 weeks   OT Treatment/Interventions Self-care/ADL training;Moist Heat;DME and/or AE instruction;Neuromuscular education;Therapeutic exercise;Functional Mobility Training;Manual Therapy;Passive range of motion;Therapeutic activities;Balance training;Patient/family education   Plan finalize HEP, coordination, reach, d/c from OT   Consulted and Agree with Plan of Care Patient        Problem List Patient Active Problem List   Diagnosis Date Noted  . Impaired mobility 04/23/2015  . Hearing loss 03/23/2015  . HTN (hypertension) 03/18/2015  . Chronic diastolic heart failure, NYHA class 1 03/18/2015  . Spastic hemiparesis affecting dominant side 07/07/2014  . Right leg swelling 06/09/2014  . Left shoulder pain 03/16/2014  . Chest pressure 02/03/2014  . Spastic  hemiplegia affecting dominant side 11/07/2013  . Medicare annual wellness visit, subsequent 11/06/2013  . Erectile dysfunction due to arterial insufficiency 02/05/2013  . Depression   . Palpitations 06/26/2012  . HLD (hyperlipidemia)   . Restless leg syndrome   . GERD (gastroesophageal reflux disease)   . BPH with obstruction/lower urinary tract symptoms   . History of CVA (cerebrovascular accident) 03/11/2012  . Hemiparesis affecting right side as late effect of stroke 03/11/2012  . Type 2 diabetes, controlled, with neuropathy 03/11/2012  . OSA (obstructive sleep apnea) 03/11/2012  . CKD (chronic kidney disease) stage 3, GFR 30-59 ml/min 03/11/2012  . Migraines 03/11/2012    Quay Burow, OTR/L 04/27/2015, 9:36 AM  [facility] [address] [place], [name], [address] Phone: [phone]   Fax:  [phone]

## 2015-04-27 NOTE — Therapy (Signed)
[facility] [address] [place], [name], [address] Phone: [phone]   Fax:  [phone]  Physical Therapy Treatment  Patient Details  Name: James Bell MRN: [id number] Date of Birth: 07-Sep-1931 Referring Provider:  [name], MD  Encounter Date: 04/27/2015    Past Medical History  Diagnosis Date  . HLD (hyperlipidemia)   . Restless leg syndrome     well controlled on mirapex  . OSA on CPAP     sleep study 01/2014 - severe, CPAP at 12, previously on nuvigil (Dr. [name])  . Type 2 diabetes, controlled, with neuropathy 2011  . H/O hiatal hernia   . GERD (gastroesophageal reflux disease)     with sliding HH  . Migraines 03/11/12    now rare  . CVA (cerebral infarction) 03/07/12    slurred speech; right hemplegia, left handed - completed PT 07/2014 (17 sessions)  . Basal cell carcinoma of nose 2002    bridge of nose  . History of kidney problems 12/2010    lacerated left kidney after fall  . BPH with obstruction/lower urinary tract symptoms     failed flomax, uroxatral, myrbetriq - sees urology [name]) pending videourodynamics  . Depression     mild, on cymbalta per prior PCP  . Palpitations 01/2012    holter WNL, rare PAC/PVCs  . Erectile dysfunction     per urology (prostaglandin injection)  . Kidney cysts 02/2014    bilateral (renal US)  . CKD (chronic kidney disease) stage 3, GFR 30-59 ml/min 03/11/2012    Renal US 02/2014 - multiple kidney cysts   . TIA (transient ischemic attack)   . Anemia     Past Surgical History  Procedure Laterality Date  . Prostate surgery  02/2009    PVP (laser)  . Tonsillectomy and adenoidectomy  ~ 1945  . Hemorrhoid surgery  1991  . Cataract extraction w/ intraocular lens implant  11//2012 - 08/2011  . Inguinal hernia repair  1981; 2002    left, then repaired  . Skin cancer excision  2002    bridge of nose, BCC  . Colonoscopy  05/2011    normal per  pt (Dr. [name], [place], [name])  . Mri brain  04/2010    WNL  . Esophagogastroduodenoscopy  2006    duodenitis, medual HH, Grade 2 reflux, mild gastritis  . Colonoscopy  03/2011    poor prep, unable to biopsy polyp in tic fold, rpt 3 mo [name])  . Abi  2007    WNL  . Urethral dilation  2014    tannenbaum  . Cystoscopy with urethral dilatation N/A 07/24/2013    Procedure: CYSTOSCOPY WITH URETHRAL DILATATION;  Surgeon: [name], MD;  Location: [facility];  Service: Urology;  Laterality: N/A;  . Transurethral incision of prostate N/A 07/24/2013    Procedure: TRANSURETHRAL INCISION OF THE PROSTATE (TUIP);  Surgeon: [name], MD;  Location: [facility];  Service: Urology;  Laterality: N/A;  . Esophagogastroduodenoscopy  07/2011    mild chronic gastritis  . Colonoscopy  06/2011    mult polyps adenomatous, severe diverticulosis, rpt 3 yrs [name])  . Carotid endarterectomy    . Cardiac catheterization  2004    normal; [facility]  . Cardiovascular stress test  02/2014    WNL, low risk scan, EF 68%    There were no vitals filed for this visit.  Visit Diagnosis:  Abnormality of gait  Generalized weakness  Subjective Assessment - 04/27/15 1220    Subjective The patient feels the carryover of exercises after end of PT to home is most challenging part of improvement.   Patient Stated Goals Return to walking more confidently and improve balance.   Currently in Pain? No/denies      Gait: Ambulation gait speed (below) Gait x 10 minutes with rollater RW Discussed how to use rollater RW to increase endurance and decrease dependence on manual w/c in the home environment. Gait x 200 ft with SBQC x modified indep and with rollater RW modified indep.  Patient demonstrates improved reciprocal nature of ambulation when using rollater and can walk further with more consistent pattern.      [facility] PT Assessment - 04/27/15 0947    Ambulation/Gait   Ambulation/Gait Yes    Ambulation/Gait Assistance 6: Modified independent (Device/Increase time)   Ambulation Distance (Feet) 100 Feet   Assistive device --  rollater vs. SBQC gait speed   Gait Pattern Decreased stance time - right   Gait velocity --  2.01 ft/sec with SBQC, 2.15 ft/sec with rollater RW      SELF CARE/HOME MANAGEMENT: Discussed home safety, post d/c HEP and how to best incorporate recommending slow increases in ambulation, participation in community fitness center (with home aide assist), and use of rollater in community. Patient verbalizes understanding of recommendations.        PT Education - 04/27/15 1222    Education provided Yes   Education Details Discussed strategies to have greater success with HEP carryover to home, discussed using rollater RW and depending less on manual w/cin the home.   Person(s) Educated Patient   Methods Explanation   Comprehension Verbalized understanding          PT Short Term Goals - 04/06/15 0936    PT SHORT TERM GOAL #1   Title Pt will be independent in HEP to improve strength and functional mobility.    Baseline Pt doing some HEP, however PT to further review.   Time 4   Period Weeks   Status Partially Met   PT SHORT TERM GOAL #2   Title Pt will improve Berg from 42/56 up to > or equal to 45/56 to demo decreasing risk for falls.   Baseline Met on 04/06/2015 scoring 46/56   Time 4   Period Weeks   Status Achieved   PT SHORT TERM GOAL #3   Title The patient will increase gait speed from 1.85 ft/sec up to 2.2 ft/sec to demo improving gait speed for functional mobility.   Baseline Partially met, with patient improving to 2.01 ft/sec on 04/06/2015.   Time 4   Period Weeks   Status Partially Met   PT SHORT TERM GOAL #4   Title Pt will ambulate > or equal to 400 ft nonstop on level/unlevel surfaces with LRAD modified indep to demo improving endurance.   Baseline Patient ambulates 400 ft, however required supervision when fatigued due to narrowing  base of support.   Time 4   Period Weeks   Status Partially Met           PT Long Term Goals - 04/27/15 1223    PT LONG TERM GOAL #1   Title Pt will be able to verbalize understanding of falls prevention strategies to reduce risk of falls.    Baseline Met on 04/22/2015- patient reports having automatic light sensor indoors for lights to come on, decreased throw rugs, use of devices when tired.   Time 8  Period Weeks   Status Achieved   PT LONG TERM GOAL #2   Title Patient will improve Berg from 42/56 up to > or equal to 49/56 to demo decreased risk for fall.   Baseline Patient scores 45/56 on 04/22/2015.  Anticipate this is patient's top level for performance.   Time 8   Period Weeks   Status Partially Met   PT LONG TERM GOAL #3   Title The patient will increase gait speed from 1.85 ft/sec up to 2.6 ft/sec to demo improving functional mobility.   Baseline IMproved to 2.2 ft/sec on 2015-04-28   Time 8   Period Weeks   Status Partially Met   PT LONG TERM GOAL #4   Title The patient will ambulate for 10 minutes nonstop to demo improved endurance for functional mobility.   Baseline Goal met on 04/20/15 with patient walking for 10+ minutes.  He had one loss of balance requiring assist to recover when using SBQC.  Pt does mod indep with rollater RW x 2015-04-28.   Time 8   Period Weeks   Status Achieved   PT LONG TERM GOAL #5   Title Pt will verbalize understanding of HEP for post d/c activities.   Baseline Met on 04/20/2015 with patient with fitness center routine and HEP written.   Time 8   Period Weeks   Status Achieved   PT LONG TERM GOAL #6   Title Pt will improve ABC scale from 53.8% up to 68%.   Baseline Target date 04/30/2015   Time 8   Period Weeks   Status On-going               Plan - Apr 28, 2015 1225    Clinical Impression Statement Pt has partially met LTGs.  He improved Berg and gait speed, but did not not fully meet goal.  PT e-mailed activities balance confidence  scale, awaiting patient completion to address last unchecked LTG.   PT Next Visit Plan d/c today.   Consulted and Agree with Plan of Care Patient          G-Codes - 04/28/2015 1231    Functional Limitation Self care   Self Care Current Status [phone]) At least 20 percent but less than 40 percent impaired, limited or restricted   Self Care Goal Status ([id number]) At least 20 percent but less than 40 percent impaired, limited or restricted   Self Care Discharge Status [phone]) At least 20 percent but less than 40 percent impaired, limited or restricted      Problem List Patient Active Problem List   Diagnosis Date Noted  . Impaired mobility 04/23/2015  . Hearing loss 03/23/2015  . HTN (hypertension) 03/18/2015  . Chronic diastolic heart failure, NYHA class 1 03/18/2015  . Spastic hemiparesis affecting dominant side 07/07/2014  . Right leg swelling 06/09/2014  . Left shoulder pain 03/16/2014  . Chest pressure 02/03/2014  . Spastic hemiplegia affecting dominant side 11/07/2013  . Medicare annual wellness visit, subsequent 11/06/2013  . Erectile dysfunction due to arterial insufficiency 02/05/2013  . Depression   . Palpitations 06/26/2012  . HLD (hyperlipidemia)   . Restless leg syndrome   . GERD (gastroesophageal reflux disease)   . BPH with obstruction/lower urinary tract symptoms   . History of CVA (cerebrovascular accident) 03/11/2012  . Hemiparesis affecting right side as late effect of stroke 03/11/2012  . Type 2 diabetes, controlled, with neuropathy 03/11/2012  . OSA (obstructive sleep apnea) 03/11/2012  . CKD (chronic kidney disease) stage  3, GFR 30-59 ml/min 03/11/2012  . Migraines 03/11/2012    Aniyla Harling, PT 04/27/2015, 12:32 PM  [facility] [address] [place], [name], [address] Phone: [phone]   Fax:  [phone]

## 2015-04-29 ENCOUNTER — Ambulatory Visit: Payer: Medicare Other | Admitting: Physical Therapy

## 2015-04-29 ENCOUNTER — Ambulatory Visit: Payer: Medicare Other | Admitting: Occupational Therapy

## 2015-04-29 ENCOUNTER — Encounter: Payer: Self-pay | Admitting: Occupational Therapy

## 2015-04-29 DIAGNOSIS — I69951 Hemiplegia and hemiparesis following unspecified cerebrovascular disease affecting right dominant side: Secondary | ICD-10-CM

## 2015-04-29 DIAGNOSIS — R279 Unspecified lack of coordination: Secondary | ICD-10-CM | POA: Diagnosis not present

## 2015-04-29 DIAGNOSIS — M6281 Muscle weakness (generalized): Secondary | ICD-10-CM

## 2015-04-29 DIAGNOSIS — R269 Unspecified abnormalities of gait and mobility: Secondary | ICD-10-CM | POA: Diagnosis not present

## 2015-04-29 DIAGNOSIS — IMO0002 Reserved for concepts with insufficient information to code with codable children: Secondary | ICD-10-CM

## 2015-04-29 DIAGNOSIS — I6980 Unspecified sequelae of other cerebrovascular disease: Secondary | ICD-10-CM | POA: Diagnosis not present

## 2015-04-29 DIAGNOSIS — R531 Weakness: Secondary | ICD-10-CM | POA: Diagnosis not present

## 2015-04-29 NOTE — Patient Instructions (Signed)
How to Upgrade your home program for your arm:  Down the road you may find that your home program gets "too easy" and may feel you are not challenged enough. Here are some ways to increase the challenge of your home program if you choose.  1.  Increase the number of repetitions.  Currently you are doing 10 reps, resting and doing 10 more. You can increase to 12 and if that goes well can then increase to 15 repetitions. Make sure you don't get pain from too many reps.  You also don't need to feel like you need to do the increased repetitions every day. You could increase just a few days a week if you choose.  2. You can increase the weight but be very careful not to increase your tone. If you really want to increase weight do it slowly and to not increase reps at the same time.  If you notice that your tone is increasing drop back down to the weight you had before. Do not increase more than a pound at a time.  3. You can increase the total number of times you do the program per week - for example if you are doing it 12 times in a week can increase to 14.  4. USE YOUR ARM FUNCTIONALLY as much as possible. Look for new ways to try and use it.  Set a goal that each week you will use it for something new. This doesn't need to be a great big thing you use it for - every single time you use it for anything it helps!!!  It has been a pleasure and honor to work with you!! Good luck!

## 2015-04-29 NOTE — Therapy (Signed)
[facility] [address] [place], [name], [address] Phone: [phone]   Fax:  [phone]  Occupational Therapy Treatment  Patient Details  Name: James Bell MRN: [id number] Date of Birth: 1930/11/18 Referring Provider:  [name], MD  Encounter Date: 04/29/2015      OT End of Session - 04/29/15 0932    Visit Number 16   Number of Visits 16   Date for OT Re-Evaluation 04/13/15   Authorization Type MCR trad, tricare - G code needed   OT Start Time 0845   OT Stop Time 0925   OT Time Calculation (min) 40 min      Past Medical History  Diagnosis Date  . HLD (hyperlipidemia)   . Restless leg syndrome     well controlled on mirapex  . OSA on CPAP     sleep study 01/2014 - severe, CPAP at 10, previously on nuvigil (Dr. [name])  . Type 2 diabetes, controlled, with neuropathy 2011  . H/O hiatal hernia   . GERD (gastroesophageal reflux disease)     with sliding HH  . Migraines 03/11/12    now rare  . CVA (cerebral infarction) 03/07/12    slurred speech; right hemplegia, left handed - completed PT 07/2014 (17 sessions)  . Basal cell carcinoma of nose 2002    bridge of nose  . History of kidney problems 12/2010    lacerated left kidney after fall  . BPH with obstruction/lower urinary tract symptoms     failed flomax, uroxatral, myrbetriq - sees urology [name]) pending videourodynamics  . Depression     mild, on cymbalta per prior PCP  . Palpitations 01/2012    holter WNL, rare PAC/PVCs  . Erectile dysfunction     per urology (prostaglandin injection)  . Kidney cysts 02/2014    bilateral (renal US)  . CKD (chronic kidney disease) stage 3, GFR 30-59 ml/min 03/11/2012    Renal US 02/2014 - multiple kidney cysts   . TIA (transient ischemic attack)   . Anemia     Past Surgical History  Procedure Laterality Date  . Prostate surgery  02/2009    PVP (laser)  . Tonsillectomy and adenoidectomy   ~ 1945  . Hemorrhoid surgery  1991  . Cataract extraction w/ intraocular lens implant  11//2012 - 08/2011  . Inguinal hernia repair  1981; 2002    left, then repaired  . Skin cancer excision  2002    bridge of nose, BCC  . Colonoscopy  05/2011    normal per pt (Dr. [name], [place], [name])  . Mri brain  04/2010    WNL  . Esophagogastroduodenoscopy  2006    duodenitis, medual HH, Grade 2 reflux, mild gastritis  . Colonoscopy  03/2011    poor prep, unable to biopsy polyp in tic fold, rpt 3 mo [name])  . Abi  2007    WNL  . Urethral dilation  2014    tannenbaum  . Cystoscopy with urethral dilatation N/A 07/24/2013    Procedure: CYSTOSCOPY WITH URETHRAL DILATATION;  Surgeon: [name], MD;  Location: [facility];  Service: Urology;  Laterality: N/A;  . Transurethral incision of prostate N/A 07/24/2013    Procedure: TRANSURETHRAL INCISION OF THE PROSTATE (TUIP);  Surgeon: [name], MD;  Location: [facility];  Service: Urology;  Laterality: N/A;  . Esophagogastroduodenoscopy  07/2011    mild chronic gastritis  . Colonoscopy  06/2011    mult polyps adenomatous, severe  diverticulosis, rpt 3 yrs [name])  . Carotid endarterectomy    . Cardiac catheterization  2004    normal; [facility]  . Cardiovascular stress test  02/2014    WNL, low risk scan, EF 68%    There were no vitals filed for this visit.  Visit Diagnosis:  Hemiplegia affecting right side in right-dominant patient as late effect of cerebrovascular disease  Lack of coordination due to stroke  Muscle weakness      Subjective Assessment - 04/29/15 0852    Subjective  I don't really want to use the walker PT recommended but I will   Pertinent History see epic snapshot;  pt with H/o of CVA   Patient Stated Goals I have made alot of progress but would like to be able to do even more.   Currently in Pain? No/denies                      OT Treatments/Exercises (OP) - 04/29/15 0001    Exercises    Exercises Shoulder   Shoulder Exercises: Supine   Other Supine Exercises Provided education on ways to upgrade HEP as pt is being discharged today. Pt able to verbalize understanding and return demonstrate where appropriatel.  Also dsucssed ways to adapt to play ukelele with RUE. Pt given guidelines on how to progress this activity as well.  Pt with no further questions. Pt has made excellent progress and states he is extremely pleased with outcomes.                OT Education - 04/29/15 [id number]    Education provided Yes   Education Details How to upgrade HEP, adaptations to playing ukelele          OT Short Term Goals - 04/29/15 0930    OT SHORT TERM GOAL #1   Title Pt will be mod I with HEP - 03/16/2015   Status Achieved   OT SHORT TERM GOAL #2   Title Pt will demonstrate ability to complete mid level reach to pick up light object off shelf   Status Partially Met  with mod compensations   OT SHORT TERM GOAL #3   Title Pt will report no greater than 2/10 pain in R shoulder with shoulder flexion to 130* and abduction to 100* in supine.   Status Achieved  140* shoulder flexion/105* abduction with no pain   OT SHORT TERM GOAL #4   Title Pt will demonstrate understanding of strategies to compensate for tying and zipping with AE prn   Status Achieved           OT Long Term Goals - 04/29/15 0930    OT LONG TERM GOAL #1   Title Pt will be mod I with upgraded HEP prn - 05/25/2015   Time 8   Period Weeks   Status Achieved   OT LONG TERM GOAL #2   Title Pt will demonstrate ability for 90* of shoulder flexion with assisted reach to obtain light object off shelf with min compensations   Time 6   Period Weeks   Status Achieved   OT LONG TERM GOAL #3   Title Pt will demonstrate improved coordination as evidenced by completing 9 hole peg in no more than 1.50 seconds  (baseline= 4 pegs)   Time 6   Period Weeks   Status Not Met   OT LONG TERM GOAL #4   Title Pt will be able to  write name with AE  prn with R hand legibily   Status Achieved   OT LONG TERM GOAL #5   Title Pt will demonstrate ability to use RUE as non dominant for basic ADL' tasks (baseline= gross assist)   Status Achieved   OT LONG TERM GOAL #6   Title Pt will be able to use RUE in cooking task as non dominant for at least 50% of the task.    Status Achieved               Plan - 04/29/15 0931    Clinical Impression Statement Pt has met 5/6 LTG's and is using RUE much more functionally at home. Pt has made excellent progress and will be d/c from OT today. Pt in agreement.    Pt will benefit from skilled therapeutic intervention in order to improve on the following deficits (Retired) Decreased balance;Decreased coordination;Decreased mobility;Decreased range of motion;Decreased strength;Impaired UE functional use;Impaired tone;Impaired flexibility;Pain;Difficulty walking   Rehab Potential Good   Clinical Impairments Affecting Rehab Potential Pt will benefit from skilled OT to address the above deficits.   OT Frequency 2x / week   OT Duration 8 weeks   OT Treatment/Interventions Self-care/ADL training;Moist Heat;DME and/or AE instruction;Neuromuscular education;Therapeutic exercise;Functional Mobility Training;Manual Therapy;Passive range of motion;Therapeutic activities;Balance training;Patient/family education   Plan d/c from [facility] see todays note   Consulted and Agree with Plan of Care Patient        Problem List Patient Active Problem List   Diagnosis Date Noted  . Impaired mobility 04/23/2015  . Hearing loss 03/23/2015  . HTN (hypertension) 03/18/2015  . Chronic diastolic heart failure, NYHA class 1 03/18/2015  . Spastic hemiparesis affecting dominant side 07/07/2014  . Right leg swelling 06/09/2014  . Left shoulder pain 03/16/2014  . Chest pressure 02/03/2014  . Spastic hemiplegia affecting dominant side 11/07/2013  . Medicare annual wellness visit,  subsequent 11/06/2013  . Erectile dysfunction due to arterial insufficiency 02/05/2013  . Depression   . Palpitations 06/26/2012  . HLD (hyperlipidemia)   . Restless leg syndrome   . GERD (gastroesophageal reflux disease)   . BPH with obstruction/lower urinary tract symptoms   . History of CVA (cerebrovascular accident) 03/11/2012  . Hemiparesis affecting right side as late effect of stroke 03/11/2012  . Type 2 diabetes, controlled, with neuropathy 03/11/2012  . OSA (obstructive sleep apnea) 03/11/2012  . CKD (chronic kidney disease) stage 3, GFR 30-59 ml/min 03/11/2012  . Migraines 03/11/2012   OCCUPATIONAL THERAPY DISCHARGE SUMMARY  Visits from Start of Care: 16  Current functional level related to goals / functional outcomes: See above goals   Remaining deficits: R hemiplegia, decreased balance, decreased endurance  Education / Equipment: HEP  Plan: Patient agrees to discharge.  Patient goals were met. Patient is being discharged due to meeting the stated rehab goals.  ?????     Quay Burow, OTR/L 04/29/2015, 9:35 AM  [facility] [address] [place], [name], [address] Phone: [phone]   Fax:  [phone]

## 2015-04-30 ENCOUNTER — Encounter: Payer: Self-pay | Admitting: Rehabilitative and Restorative Service Providers"

## 2015-04-30 NOTE — Therapy (Signed)
[facility] [address] [place], [name], [address] Phone: [phone]   Fax:  [phone]  Patient Details  Name: James Bell MRN: [id number] Date of Birth: February 20, 1931 Referring Provider:  No ref. provider found  Encounter Date: 04/27/2015  PHYSICAL THERAPY DISCHARGE SUMMARY  Visits from Start of Care: 11  Current functional level related to goals / functional outcomes:     PT Short Term Goals - 04/06/15 0936    PT SHORT TERM GOAL #1   Title Pt will be independent in HEP to improve strength and functional mobility.    Baseline Pt doing some HEP, however PT to further review.   Time 4   Period Weeks   Status Partially Met   PT SHORT TERM GOAL #2   Title Pt will improve Berg from 42/56 up to > or equal to 45/56 to demo decreasing risk for falls.   Baseline Met on 04/06/2015 scoring 46/56   Time 4   Period Weeks   Status Achieved   PT SHORT TERM GOAL #3   Title The patient will increase gait speed from 1.85 ft/sec up to 2.2 ft/sec to demo improving gait speed for functional mobility.   Baseline Partially met, with patient improving to 2.01 ft/sec on 04/06/2015.   Time 4   Period Weeks   Status Partially Met   PT SHORT TERM GOAL #4   Title Pt will ambulate > or equal to 400 ft nonstop on level/unlevel surfaces with LRAD modified indep to demo improving endurance.   Baseline Patient ambulates 400 ft, however required supervision when fatigued due to narrowing base of support.   Time 4   Period Weeks   Status Partially Met         PT Long Term Goals - 04/27/15 1223    PT LONG TERM GOAL #1   Title Pt will be able to verbalize understanding of falls prevention strategies to reduce risk of falls.    Baseline Met on 04/22/2015- patient reports having automatic light sensor indoors for lights to come on, decreased throw rugs, use of devices when tired.   Time 8   Period Weeks   Status Achieved   PT LONG TERM  GOAL #2   Title Patient will improve Berg from 42/56 up to > or equal to 49/56 to demo decreased risk for fall.   Baseline Patient scores 45/56 on 04/22/2015.  Anticipate this is patient's top level for performance.   Time 8   Period Weeks   Status Partially Met   PT LONG TERM GOAL #3   Title The patient will increase gait speed from 1.85 ft/sec up to 2.6 ft/sec to demo improving functional mobility.   Baseline IMproved to 2.2 ft/sec on 04/27/2015   Time 8   Period Weeks   Status Partially Met   PT LONG TERM GOAL #4   Title The patient will ambulate for 10 minutes nonstop to demo improved endurance for functional mobility.   Baseline Goal met on 04/20/15 with patient walking for 10+ minutes.  He had one loss of balance requiring assist to recover when using SBQC.  Pt does mod indep with rollater RW x 04/27/2015.   Time 8   Period Weeks   Status Achieved   PT LONG TERM GOAL #5   Title Pt will verbalize understanding of HEP for post d/c activities.   Baseline Met on 04/20/2015 with patient with fitness center routine and HEP written.   Time 8  Period Weeks   Status Achieved   PT LONG TERM GOAL #6   Title Pt will improve ABC scale from 53.8% up to 68%.   Baseline Pt improved 10% on ABC scale   Time 8   Period Weeks   Status Partially met.        Remaining deficits: Chronic hemimotor impairments Abnormality of gait with fall risk   Education / Equipment: HEP for post d/c, equipment needs.  Plan: Patient agrees to discharge.  Patient goals were partially met. Patient is being discharged due to meeting the stated rehab goals.  ?????        Thank you for the referral of this patient. Rudell Cobb, MPT  [place], PT 04/30/2015, 4:25 PM  [facility] [address] [place], [name], [address] Phone: [phone]   Fax:  [phone]

## 2015-05-04 DIAGNOSIS — N401 Enlarged prostate with lower urinary tract symptoms: Secondary | ICD-10-CM | POA: Diagnosis not present

## 2015-05-04 DIAGNOSIS — N481 Balanitis: Secondary | ICD-10-CM | POA: Diagnosis not present

## 2015-05-04 DIAGNOSIS — N5201 Erectile dysfunction due to arterial insufficiency: Secondary | ICD-10-CM | POA: Diagnosis not present

## 2015-05-20 ENCOUNTER — Encounter: Payer: Self-pay | Admitting: Physical Medicine & Rehabilitation

## 2015-05-20 ENCOUNTER — Encounter: Payer: Medicare Other | Attending: Physical Medicine & Rehabilitation

## 2015-05-20 ENCOUNTER — Ambulatory Visit (HOSPITAL_BASED_OUTPATIENT_CLINIC_OR_DEPARTMENT_OTHER): Payer: Medicare Other | Admitting: Physical Medicine & Rehabilitation

## 2015-05-20 VITALS — BP 124/65 | HR 80 | Resp 14

## 2015-05-20 DIAGNOSIS — I639 Cerebral infarction, unspecified: Secondary | ICD-10-CM

## 2015-05-20 DIAGNOSIS — M7989 Other specified soft tissue disorders: Secondary | ICD-10-CM | POA: Diagnosis not present

## 2015-05-20 DIAGNOSIS — G8110 Spastic hemiplegia affecting unspecified side: Secondary | ICD-10-CM | POA: Insufficient documentation

## 2015-05-20 NOTE — Progress Notes (Signed)
Subjective:    Patient ID: James Bell, male    DOB: 1931-05-08, 81 y.o.   MRN: [id number]  HPI Biceps100 FCR50 FCU0 FDS50 FDP0 FPL0 04/08/2015 Good results from the above regimen. Able to extend arm and fingers better  Has completed outpatient therapy on 04/29/2015. Has a good home exercise program. Using a new walker. Wheels and brakes. Ambulating in his neighborhood up to 1/2 mile daily. Patient has not fallen  Remains independent with all his dressing and bathing  Independent with cooking And driving, Patient is now using both hands for cooking tasks.  Pain Inventory Average Pain 0 Pain Right Now 0 My pain is no pain  In the last 24 hours, has pain interfered with the following? General activity 4 Relation with others 4 Enjoyment of life 5 What TIME of day is your pain at its worst? no pain Sleep (in general) Fair  Pain is worse with: no pain Pain improves with: no pain Relief from Meds: no pain  Mobility walk with assistance use a cane use a walker ability to climb steps?  yes do you drive?  yes Do you have any goals in this area?  yes  Function retired Do you have any goals in this area?  no  Neuro/Psych No problems in this area  Prior Studies Any changes since last visit?  no  Physicians involved in your care Any changes since last visit?  no   Family History  Problem Relation Age of Onset  . Stroke Father   . Lung cancer Father 53     smoker  . Stroke Brother   . Diabetes Brother 69  . Coronary artery disease Neg Hx   . Lung cancer Brother   . Alcoholism Sister   . Kidney disease Sister    Social History   Social History  . Marital Status: Unknown    Spouse Name: N/A  . Number of Children: 2  . Years of Education: MD   Occupational History  . retired   .     Social History Main Topics  . Smoking status: Never Smoker   . Smokeless tobacco: Never Used     Comment: "used to smoke pipe when I was younger"  . Alcohol  Use: 2.4 oz/week    2 Glasses of wine, 2 Shots of liquor per week     Comment: occasional  . Drug Use: No  . Sexual Activity: Yes   Other Topics Concern  . None   Social History Narrative   Caffeine: 1 cup coffee/day   Divorced   Occupation: ophthalmologist   Edu: MD   Activity: rehab s/p CVA   Diet: some water, fruits/vegetables daily   POA - [name] (Daughter)   Past Surgical History  Procedure Laterality Date  . Prostate surgery  02/2009    PVP (laser)  . Tonsillectomy and adenoidectomy  ~ 1945  . Hemorrhoid surgery  1991  . Cataract extraction w/ intraocular lens implant  11//2012 - 08/2011  . Inguinal hernia repair  1981; 2002    left, then repaired  . Skin cancer excision  2002    bridge of nose, BCC  . Colonoscopy  05/2011    normal per pt (Dr. [name], [place], [name])  . Mri brain  04/2010    WNL  . Esophagogastroduodenoscopy  2006    duodenitis, medual HH, Grade 2 reflux, mild gastritis  . Colonoscopy  03/2011    poor prep, unable to biopsy polyp in  tic fold, rpt 3 mo [name])  . Abi  2007    WNL  . Urethral dilation  2014    tannenbaum  . Cystoscopy with urethral dilatation N/A 07/24/2013    Procedure: CYSTOSCOPY WITH URETHRAL DILATATION;  Surgeon: [name], MD;  Location: [facility];  Service: Urology;  Laterality: N/A;  . Transurethral incision of prostate N/A 07/24/2013    Procedure: TRANSURETHRAL INCISION OF THE PROSTATE (TUIP);  Surgeon: [name], MD;  Location: [facility];  Service: Urology;  Laterality: N/A;  . Esophagogastroduodenoscopy  07/2011    mild chronic gastritis  . Colonoscopy  06/2011    mult polyps adenomatous, severe diverticulosis, rpt 3 yrs [name])  . Carotid endarterectomy    . Cardiac catheterization  2004    normal; [facility]  . Cardiovascular stress test  02/2014    WNL, low risk scan, EF 68%   Past Medical History  Diagnosis Date  . HLD (hyperlipidemia)   . Restless leg syndrome     well controlled on  mirapex  . OSA on CPAP     sleep study 01/2014 - severe, CPAP at 10, previously on nuvigil (Dr. [name])  . Type 2 diabetes, controlled, with neuropathy 2011  . H/O hiatal hernia   . GERD (gastroesophageal reflux disease)     with sliding HH  . Migraines 03/11/12    now rare  . CVA (cerebral infarction) 03/07/12    slurred speech; right hemplegia, left handed - completed PT 07/2014 (17 sessions)  . Basal cell carcinoma of nose 2002    bridge of nose  . History of kidney problems 12/2010    lacerated left kidney after fall  . BPH with obstruction/lower urinary tract symptoms     failed flomax, uroxatral, myrbetriq - sees urology [name]) pending videourodynamics  . Depression     mild, on cymbalta per prior PCP  . Palpitations 01/2012    holter WNL, rare PAC/PVCs  . Erectile dysfunction     per urology (prostaglandin injection)  . Kidney cysts 02/2014    bilateral (renal US)  . CKD (chronic kidney disease) stage 3, GFR 30-59 ml/min 03/11/2012    Renal US 02/2014 - multiple kidney cysts   . TIA (transient ischemic attack)   . Anemia    BP 124/65 mmHg  Pulse 80  Resp 14  SpO2 98%  Opioid Risk Score:   Fall Risk Score:  `1  Depression screen PHQ 2/9  Depression screen [facility] 2/9 04/08/2015 11/06/2013 07/05/2012  Decreased Interest 0 1 0  Down, Depressed, Hopeless 0 1 0  PHQ - 2 Score 0 2 0  Altered sleeping - 3 -  Tired, decreased energy - 1 -  Change in appetite - 1 -  Feeling bad or failure about yourself  - 0 -  Trouble concentrating - 1 -  Moving slowly or fidgety/restless - 0 -  Suicidal thoughts - 0 -  PHQ-9 Score - 8 -     Review of Systems  All other systems reviewed and are negative.      Objective:   Physical Exam  Constitutional: He is oriented to person, place, and time. He appears well-developed and well-nourished.  HENT:  Head: Normocephalic and atraumatic.  Eyes: Conjunctivae and EOM are normal. Pupils are equal, round, and reactive to light.    Neck: Normal range of motion.  Neurological: He is alert and oriented to person, place, and time.  Psychiatric: He has a normal mood and affect.  Nursing note and vitals reviewed.   Tone Right biceps at with one right finger flexors-with 1 right wrist flexors Ashworth 1 Motor strength is 3 minus at the right deltoid, biceps triceps 4 minus at the grip Right lower extremity for at the flexor and extensor 3 at the ankle dorsiflexor Left side is 5/5 Gait is using a rolling walker no evidence of toe drag or knee instability using AFOs well.       Assessment & Plan:  1. Right spastic hemiplegia secondary to left CVA greater than 1 year ago. He has made some functional improvements even at this late stage with additional OT. He is now using right upper extremity to assist with cooking tasks and bimanual activities. The Botox dosing and muscle selection listed above appears to be optimal for this patient. Can see him back on an every three-month basis for the Botox. The next one is due in 6 weeks.

## 2015-05-20 NOTE — Patient Instructions (Signed)
Same Botox dosing next visit

## 2015-05-31 ENCOUNTER — Telehealth: Payer: Self-pay | Admitting: Neurology

## 2015-05-31 NOTE — Telephone Encounter (Signed)
I called number but was disconnected when I was on hold.

## 2015-05-31 NOTE — Telephone Encounter (Signed)
[facility] [phone] needs results from split sleep study done 01/13/2014

## 2015-06-01 NOTE — Telephone Encounter (Signed)
Error

## 2015-06-01 NOTE — Telephone Encounter (Signed)
I left message for call back at [facility]. Patient currently uses [employer] for DME company, I do not think he uses [employer].

## 2015-06-11 ENCOUNTER — Other Ambulatory Visit: Payer: Self-pay | Admitting: Family Medicine

## 2015-06-13 ENCOUNTER — Other Ambulatory Visit: Payer: Self-pay | Admitting: Family Medicine

## 2015-06-13 DIAGNOSIS — E1140 Type 2 diabetes mellitus with diabetic neuropathy, unspecified: Secondary | ICD-10-CM

## 2015-06-13 DIAGNOSIS — E785 Hyperlipidemia, unspecified: Secondary | ICD-10-CM

## 2015-06-13 DIAGNOSIS — N183 Chronic kidney disease, stage 3 unspecified: Secondary | ICD-10-CM

## 2015-06-16 ENCOUNTER — Other Ambulatory Visit: Payer: Medicare Other

## 2015-06-23 ENCOUNTER — Encounter: Payer: Self-pay | Admitting: Family Medicine

## 2015-06-23 ENCOUNTER — Ambulatory Visit (INDEPENDENT_AMBULATORY_CARE_PROVIDER_SITE_OTHER): Payer: Medicare Other | Admitting: Family Medicine

## 2015-06-23 ENCOUNTER — Other Ambulatory Visit: Payer: Self-pay | Admitting: Family Medicine

## 2015-06-23 VITALS — BP 122/66 | HR 92 | Temp 97.4°F | Ht 69.0 in | Wt 183.8 lb

## 2015-06-23 DIAGNOSIS — I639 Cerebral infarction, unspecified: Secondary | ICD-10-CM

## 2015-06-23 DIAGNOSIS — G2581 Restless legs syndrome: Secondary | ICD-10-CM

## 2015-06-23 DIAGNOSIS — F32A Depression, unspecified: Secondary | ICD-10-CM

## 2015-06-23 DIAGNOSIS — N183 Chronic kidney disease, stage 3 unspecified: Secondary | ICD-10-CM

## 2015-06-23 DIAGNOSIS — E785 Hyperlipidemia, unspecified: Secondary | ICD-10-CM | POA: Diagnosis not present

## 2015-06-23 DIAGNOSIS — L989 Disorder of the skin and subcutaneous tissue, unspecified: Secondary | ICD-10-CM | POA: Insufficient documentation

## 2015-06-23 DIAGNOSIS — I69351 Hemiplegia and hemiparesis following cerebral infarction affecting right dominant side: Secondary | ICD-10-CM

## 2015-06-23 DIAGNOSIS — Z23 Encounter for immunization: Secondary | ICD-10-CM

## 2015-06-23 DIAGNOSIS — H9191 Unspecified hearing loss, right ear: Secondary | ICD-10-CM

## 2015-06-23 DIAGNOSIS — Z0000 Encounter for general adult medical examination without abnormal findings: Secondary | ICD-10-CM

## 2015-06-23 DIAGNOSIS — I5032 Chronic diastolic (congestive) heart failure: Secondary | ICD-10-CM

## 2015-06-23 DIAGNOSIS — Z7189 Other specified counseling: Secondary | ICD-10-CM | POA: Insufficient documentation

## 2015-06-23 DIAGNOSIS — R21 Rash and other nonspecific skin eruption: Secondary | ICD-10-CM

## 2015-06-23 DIAGNOSIS — F329 Major depressive disorder, single episode, unspecified: Secondary | ICD-10-CM

## 2015-06-23 DIAGNOSIS — I10 Essential (primary) hypertension: Secondary | ICD-10-CM

## 2015-06-23 DIAGNOSIS — Z7409 Other reduced mobility: Secondary | ICD-10-CM

## 2015-06-23 DIAGNOSIS — E1140 Type 2 diabetes mellitus with diabetic neuropathy, unspecified: Secondary | ICD-10-CM

## 2015-06-23 LAB — RENAL FUNCTION PANEL
Albumin: 3.9 g/dL (ref 3.5–5.2)
BUN: 24 mg/dL — ABNORMAL HIGH (ref 6–23)
CO2: 26 mEq/L (ref 19–32)
Calcium: 9.5 mg/dL (ref 8.4–10.5)
Chloride: 105 mEq/L (ref 96–112)
Creatinine, Ser: 1.38 mg/dL (ref 0.40–1.50)
GFR: 52.43 mL/min — ABNORMAL LOW (ref >60.00)
Glucose, Bld: 101 mg/dL — ABNORMAL HIGH (ref 70–99)
Phosphorus: 4.6 mg/dL (ref 2.3–4.6)
Potassium: 3.7 mEq/L (ref 3.5–5.1)
Sodium: 140 mEq/L (ref 135–145)

## 2015-06-23 LAB — HEMOGLOBIN A1C: Hgb A1c MFr Bld: 7.3 % — ABNORMAL HIGH (ref 4.6–6.5)

## 2015-06-23 LAB — LDL CHOLESTEROL, DIRECT: Direct LDL: 64 mg/dL

## 2015-06-23 LAB — VITAMIN D 25 HYDROXY (VIT D DEFICIENCY, FRACTURES): VITD: 50.43 ng/mL (ref 30.00–100.00)

## 2015-06-23 MED ORDER — CLOTRIMAZOLE 1 % EX CREA: 1.0000 "application " | TOPICAL_CREAM | Freq: Two times a day (BID) | CUTANEOUS | Status: DC

## 2015-06-23 MED ORDER — PRAMIPEXOLE DIHYDROCHLORIDE 1 MG PO TABS: 2.0000 mg | ORAL_TABLET | Freq: Every day | ORAL | Status: DC

## 2015-06-23 NOTE — Addendum Note (Signed)
Addended by: Marchia Bond on: 06/23/2015 09:42 AM   Modules accepted: Orders, SmartSet

## 2015-06-23 NOTE — Assessment & Plan Note (Signed)
Chronic, stable on cymbalta. Continue

## 2015-06-23 NOTE — Progress Notes (Signed)
BP 122/66 mmHg  Pulse 92  Temp(Src) 97.4 F (36.3 C) (Oral)  Ht 5\' 9"  (1.753 m)  Wt 183 lb 12 oz (83.348 kg)  BMI 27.12 kg/m2   CC: medicare wellness visit  Subjective:    Patient ID: James Bell, male    DOB: 1931-06-13, 81 y.o.   MRN: [id number]  HPI: James Bell is a 81 y.o. male presenting on 06/23/2015 for Annual Exam   14 lb weight loss over last 3 months (not reflected on our scales). Self stopped janumet several weeks ago. Continues tresiba 60u. Some low sugars recently to 50, several 70s. High of 256 after lunch last week. Sees endo [name] - due for f/u.   Nasal lesion that is returning. H/o basal cell removed from nose, worried it may be returning. Requests derm referral.   Tooth pulled 2 mo ago - treated with 3wk course clindamycin. Developed thrush and axillary rash - treating with monistat for the past week. No diarrhea.   Failed hearing screen. Has upcoming appt with audiologist.  Vision screen - with eye doctor.  2 falls in last year but denies injury - despite regular use of quad cane. Has step from garage to kitchen.  Denies depression. On cymbalta 60mg  daily.   Preventative: COLONOSCOPY Date: 06/2011 mult polyps adenomatous, severe diverticulosis, rpt 3 yrs [name]). Pt decides to defer for now.  Prostate - checked by urology  Flu shot - today  Pneumovax - 09/2007. prevnar 2015  Tetanus - 05/2005  Shingles shot - 09/2007  Advanced directives: Has advanced directives at home. Will bring copy for chart. Daughter [name] is HCPOA.  Some trouble with IADLs 2/2 stroke.   Caffeine: 1 cup coffee/day  Occupation: ophthalmologist, retired Edu: MD Activity: going to gym 3x/wk with trainer Diet: some water, fruits/vegetables some, red meat a few times, fish 1x/wk POA - [name] (Daughter)  Relevant past medical, surgical, family and social history reviewed and updated as indicated. Interim medical history since our last visit  reviewed. Allergies and medications reviewed and updated. Current Outpatient Prescriptions on File Prior to Visit  Medication Sig  . atorvastatin (LIPITOR) 40 MG tablet TAKE 1 TABLET DAILY  . clopidogrel (PLAVIX) 75 MG tablet TAKE 1 TABLET DAILY  . Coenzyme Q10 (COQ10) 150 MG CAPS Take 1 capsule by mouth 2 (two) times daily.  . DULoxetine (CYMBALTA) 60 MG capsule TAKE 1 CAPSULE DAILY  . Folic Acid-Vit I4-PYK D98 (FOLBEE) 2.5-25-1 MG TABS tablet Take 1 tablet by mouth daily.  Marland Kitchen ibuprofen (ADVIL,MOTRIN) 200 MG tablet Take 400 mg by mouth every 6 (six) hours as needed for mild pain or moderate pain.  Marland Kitchen losartan (COZAAR) 25 MG tablet Take 1 tablet (25 mg total) by mouth daily.  . metoprolol tartrate (LOPRESSOR) 25 MG tablet Take 0.5 tablets (12.5 mg total) by mouth 2 (two) times daily.  Marland Kitchen NITROSTAT 0.4 MG SL tablet DISSOLVE 1 TABLET UNDER THE TONGUE EVERY 5 MINUTES AS NEEDED FOR CHEST PAIN  . NOVOFINE 30G X 8 MM MISC USE ONE DAILY AS DIRECTED  . omega-3 acid ethyl esters (LOVAZA) 1 G capsule TAKE 2 CAPSULES TWICE A DAY  . traZODone (DESYREL) 50 MG tablet Take 25 mg by mouth at bedtime as needed.  . vitamin C (ASCORBIC ACID) 500 MG tablet Take 1,000 mg by mouth daily.   . diazepam (VALIUM) 5 MG tablet Take 1 tablet (5 mg total) by mouth every 8 (eight) hours as needed for muscle spasms. (Patient  not taking: Reported on 06/23/2015)  . sitaGLIPtin-metformin (JANUMET) 50-1000 MG per tablet Take 1 tablet by mouth 2 (two) times daily with a meal.   No current facility-administered medications on file prior to visit.    Review of Systems Per HPI unless specifically indicated above     Objective:    BP 122/66 mmHg  Pulse 92  Temp(Src) 97.4 F (36.3 C) (Oral)  Ht 5\' 9"  (1.753 m)  Wt 183 lb 12 oz (83.348 kg)  BMI 27.12 kg/m2  Wt Readings from Last 3 Encounters:  06/23/15 183 lb 12 oz (83.348 kg)  04/23/15 184 lb 8 oz (83.689 kg)  03/30/15 184 lb 6.4 oz (83.643 kg)    Physical Exam   Constitutional: He is oriented to person, place, and time. He appears well-developed and well-nourished. No distress.  HENT:  Head: Normocephalic and atraumatic.  Right Ear: Hearing, tympanic membrane, external ear and ear canal normal.  Left Ear: Hearing, tympanic membrane, external ear and ear canal normal.  Nose: Nose normal.  Mouth/Throat: Uvula is midline, oropharynx is clear and moist and mucous membranes are normal. No oropharyngeal exudate, posterior oropharyngeal edema or posterior oropharyngeal erythema.  Eyes: Conjunctivae and EOM are normal. Pupils are equal, round, and reactive to light. No scleral icterus.  Neck: Normal range of motion. Neck supple. Carotid bruit is not present. No thyromegaly present.  Cardiovascular: Normal rate, regular rhythm, normal heart sounds and intact distal pulses.   No murmur heard. Pulses:      Radial pulses are 2+ on the right side, and 2+ on the left side.  Pulmonary/Chest: Effort normal and breath sounds normal. No respiratory distress. He has no wheezes. He has no rales.  Abdominal: Soft. Bowel sounds are normal. He exhibits no distension and no mass. There is no tenderness. There is no rebound and no guarding.  Musculoskeletal: Normal range of motion. He exhibits no edema.  Lymphadenopathy:    He has no cervical adenopathy.  Neurological: He is alert and oriented to person, place, and time.  CN grossly intact, station and gait intact Recall 3/3 Calculation 5/5 serial 3s   Skin: Skin is warm and dry. Rash noted.  R axilla with macular erythematous scaling rash, resolving Nose - L ala with telangectasia but no obvious nodule.   Psychiatric: He has a normal mood and affect. His behavior is normal. Judgment and thought content normal.  Nursing note and vitals reviewed.      Assessment & Plan:   Problem List Items Addressed This Visit    Type 2 diabetes, controlled, with neuropathy ([place])    Endorses weight loss - not seen on our scales.  Pt has self stopped janumet, continues tresiba 60u daily. Encouraged schedule f/u with [name].      Relevant Medications   Insulin Degludec (TRESIBA FLEXTOUCH) 200 UNIT/ML SOPN   Skin rash    Recommended lotrimin for anticipated resolving intertrigo. Pt has run out of monistat he was previously using.      Restless leg syndrome    Increase mirapex to 2mg  nightly.      Medicare annual wellness visit, subsequent - Primary    I have personally reviewed the Medicare Annual Wellness questionnaire and have noted 1. The patient's medical and social history 2. Their use of alcohol, tobacco or illicit drugs 3. Their current medications and supplements 4. The patient's functional ability including ADL's, fall risks, home safety risks and hearing or visual impairment. Cognitive function has been assessed and addressed as  indicated.  5. Diet and physical activity 6. Evidence for depression or mood disorders The patients weight, height, BMI have been recorded in the chart. I have made referrals, counseling and provided education to the patient based on review of the above and I have provided the pt with a written personalized care plan for preventive services. Provider list updated.. See scanned questionairre as needed for further documentation. Reviewed preventative protocols and updated unless pt declined.       Impaired mobility    Encouraged continued quad cane use.  Was not using rollator today.      HTN (hypertension)    Chronic, stable. Continue current regimen.      HLD (hyperlipidemia)    Check FLP today. Continue lipitor 40mg  and fish oil 2gm BID.      Hemiparesis affecting right side as late effect of stroke ([place])    Continue quad cane use.      Hearing loss    Audiology appt pending      External nasal lesion    I don't see obvious recurrence of BCC, but will refer to derm per pt request.      Relevant Orders   Ambulatory referral to Dermatology   Depression     Chronic, stable on cymbalta. Continue       CKD (chronic kidney disease) stage 3, GFR 30-59 ml/min    Check renal panel today.      Chronic diastolic heart failure, NYHA class 1 ([place])   Advanced care planning/counseling discussion    Advanced directives: Has advanced directives at home. Will bring copy for chart. Daughter [name] is HCPOA.          Follow up plan: Return in about 4 months (around 10/24/2015), or as needed, for follow up visit.

## 2015-06-23 NOTE — Assessment & Plan Note (Signed)
I don't see obvious recurrence of BCC, but will refer to derm per pt request.

## 2015-06-23 NOTE — Assessment & Plan Note (Signed)
Audiology appt pending

## 2015-06-23 NOTE — Assessment & Plan Note (Signed)
Check FLP today. Continue lipitor 40mg  and fish oil 2gm BID.

## 2015-06-23 NOTE — Assessment & Plan Note (Signed)
Recommended lotrimin for anticipated resolving intertrigo. Pt has run out of monistat he was previously using.

## 2015-06-23 NOTE — Progress Notes (Signed)
Pre visit review using our clinic review tool, if applicable. No additional management support is needed unless otherwise documented below in the visit note. 

## 2015-06-23 NOTE — Assessment & Plan Note (Signed)
Increase mirapex to 2mg  nightly.

## 2015-06-23 NOTE — Patient Instructions (Addendum)
Bring me copy of your advanced directive and health care power of attorney form. Flu shot today. We will refer you to dermatologist for evaluation of nose spot Call to schedule follow up appointment with [name].   Health Maintenance, Male A healthy lifestyle and preventative care can promote health and wellness.  Maintain regular health, dental, and eye exams.  Eat a healthy diet. Foods like vegetables, fruits, whole grains, low-fat dairy products, and lean protein foods contain the nutrients you need and are low in calories. Decrease your intake of foods high in solid fats, added sugars, and salt. Get information about a proper diet from your health care provider, if necessary.  Regular physical exercise is one of the most important things you can do for your health. Most adults should get at least 150 minutes of moderate-intensity exercise (any activity that increases your heart rate and causes you to sweat) each week. In addition, most adults need muscle-strengthening exercises on 2 or more days a week.   Maintain a healthy weight. The body mass index (BMI) is a screening tool to identify possible weight problems. It provides an estimate of body fat based on height and weight. Your health care provider can find your BMI and can help you achieve or maintain a healthy weight. For males 20 years and older:  A BMI below 18.5 is considered underweight.  A BMI of 18.5 to 24.9 is normal.  A BMI of 25 to 29.9 is considered overweight.  A BMI of 30 and above is considered obese.  Maintain normal blood lipids and cholesterol by exercising and minimizing your intake of saturated fat. Eat a balanced diet with plenty of fruits and vegetables. Blood tests for lipids and cholesterol should begin at age 78 and be repeated every 5 years. If your lipid or cholesterol levels are high, you are over age 2, or you are at high risk for heart disease, you may need your cholesterol levels checked more  frequently.Ongoing high lipid and cholesterol levels should be treated with medicines if diet and exercise are not working.  If you smoke, find out from your health care provider how to quit. If you do not use tobacco, do not start.  Lung cancer screening is recommended for adults aged 48-80 years who are at high risk for developing lung cancer because of a history of smoking. A yearly low-dose CT scan of the lungs is recommended for people who have at least a 30-pack-year history of smoking and are current smokers or have quit within the past 15 years. A pack year of smoking is smoking an average of 1 pack of cigarettes a day for 1 year (for example, a 30-pack-year history of smoking could mean smoking 1 pack a day for 30 years or 2 packs a day for 15 years). Yearly screening should continue until the smoker has stopped smoking for at least 15 years. Yearly screening should be stopped for people who develop a health problem that would prevent them from having lung cancer treatment.  If you choose to drink alcohol, do not have more than 2 drinks per day. One drink is considered to be 12 oz (360 mL) of beer, 5 oz (150 mL) of wine, or 1.5 oz (45 mL) of liquor.  Avoid the use of street drugs. Do not share needles with anyone. Ask for help if you need support or instructions about stopping the use of drugs.  High blood pressure causes heart disease and increases the risk of stroke.  High blood pressure is more likely to develop in:  People who have blood pressure in the end of the normal range (100-139/85-89 mm Hg).  People who are overweight or obese.  People who are African American.  If you are 70-35 years of age, have your blood pressure checked every 3-5 years. If you are 33 years of age or older, have your blood pressure checked every year. You should have your blood pressure measured twice--once when you are at a hospital or clinic, and once when you are not at a hospital or clinic. Record the  average of the two measurements. To check your blood pressure when you are not at a hospital or clinic, you can use:  An automated blood pressure machine at a pharmacy.  A home blood pressure monitor.  If you are 41-10 years old, ask your health care provider if you should take aspirin to prevent heart disease.  Diabetes screening involves taking a blood sample to check your fasting blood sugar level. This should be done once every 3 years after age 46 if you are at a normal weight and without risk factors for diabetes. Testing should be considered at a younger age or be carried out more frequently if you are overweight and have at least 1 risk factor for diabetes.  Colorectal cancer can be detected and often prevented. Most routine colorectal cancer screening begins at the age of 10 and continues through age 70. However, your health care provider may recommend screening at an earlier age if you have risk factors for colon cancer. On a yearly basis, your health care provider may provide home test kits to check for hidden blood in the stool. A small camera at the end of a tube may be used to directly examine the colon (sigmoidoscopy or colonoscopy) to detect the earliest forms of colorectal cancer. Talk to your health care provider about this at age 59 when routine screening begins. A direct exam of the colon should be repeated every 5-10 years through age 40, unless early forms of precancerous polyps or small growths are found.  People who are at an increased risk for hepatitis B should be screened for this virus. You are considered at high risk for hepatitis B if:  You were born in a country where hepatitis B occurs often. Talk with your health care provider about which countries are considered high risk.  Your parents were born in a high-risk country and you have not received a shot to protect against hepatitis B (hepatitis B vaccine).  You have HIV or AIDS.  You use needles to inject street  drugs.  You live with, or have sex with, someone who has hepatitis B.  You are a man who has sex with other men (MSM).  You get hemodialysis treatment.  You take certain medicines for conditions like cancer, organ transplantation, and autoimmune conditions.  Hepatitis C blood testing is recommended for all people born from [birth year] through 1963 and any individual with known risk factors for hepatitis C.  Healthy men should no longer receive prostate-specific antigen (PSA) blood tests as part of routine cancer screening. Talk to your health care provider about prostate cancer screening.  Testicular cancer screening is not recommended for adolescents or adult males who have no symptoms. Screening includes self-exam, a health care provider exam, and other screening tests. Consult with your health care provider about any symptoms you have or any concerns you have about testicular cancer.  Practice safe sex. Use condoms  and avoid high-risk sexual practices to reduce the spread of sexually transmitted infections (STIs).  You should be screened for STIs, including gonorrhea and chlamydia if:  You are sexually active and are younger than 24 years.  You are older than 24 years, and your health care provider tells you that you are at risk for this type of infection.  Your sexual activity has changed since you were last screened, and you are at an increased risk for chlamydia or gonorrhea. Ask your health care provider if you are at risk.  If you are at risk of being infected with HIV, it is recommended that you take a prescription medicine daily to prevent HIV infection. This is called pre-exposure prophylaxis (PrEP). You are considered at risk if:  You are a man who has sex with other men (MSM).  You are a heterosexual man who is sexually active with multiple partners.  You take drugs by injection.  You are sexually active with a partner who has HIV.  Talk with your health care provider about  whether you are at high risk of being infected with HIV. If you choose to begin PrEP, you should first be tested for HIV. You should then be tested every 3 months for as long as you are taking PrEP.  Use sunscreen. Apply sunscreen liberally and repeatedly throughout the day. You should seek shade when your shadow is shorter than you. Protect yourself by wearing long sleeves, pants, a wide-brimmed hat, and sunglasses year round whenever you are outdoors.  Tell your health care provider of new moles or changes in moles, especially if there is a change in shape or color. Also, tell your health care provider if a mole is larger than the size of a pencil eraser.  A one-time screening for abdominal aortic aneurysm (AAA) and surgical repair of large AAAs by ultrasound is recommended for men aged 11-75 years who are current or former smokers.  Stay current with your vaccines (immunizations).   This information is not intended to replace advice given to you by your health care provider. Make sure you discuss any questions you have with your health care provider.   Document Released: 03/02/2008 Document Revised: 09/25/2014 Document Reviewed: 01/30/2011 Elsevier Interactive Patient Education [employer].

## 2015-06-23 NOTE — Assessment & Plan Note (Signed)
Advanced directives: Has advanced directives at home. Will bring copy for chart. Daughter Rebecca Greene is HCPOA.  

## 2015-06-23 NOTE — Assessment & Plan Note (Signed)
Continue quad cane use.

## 2015-06-23 NOTE — Assessment & Plan Note (Signed)
Endorses weight loss - not seen on our scales. Pt has self stopped janumet, continues tresiba 60u daily. Encouraged schedule f/u with [name].

## 2015-06-23 NOTE — Assessment & Plan Note (Signed)
Check renal panel today. 

## 2015-06-23 NOTE — Assessment & Plan Note (Signed)
Encouraged continued quad cane use.  Was not using rollator today.

## 2015-06-23 NOTE — Assessment & Plan Note (Signed)
Chronic, stable. Continue current regimen. 

## 2015-06-23 NOTE — Addendum Note (Signed)
Addended by: Royann Shivers A on: 06/23/2015 12:31 PM   Modules accepted: Orders

## 2015-06-23 NOTE — Assessment & Plan Note (Signed)

## 2015-06-29 ENCOUNTER — Ambulatory Visit (HOSPITAL_BASED_OUTPATIENT_CLINIC_OR_DEPARTMENT_OTHER): Payer: Medicare Other | Admitting: Physical Medicine & Rehabilitation

## 2015-06-29 ENCOUNTER — Encounter: Payer: Self-pay | Admitting: Physical Medicine & Rehabilitation

## 2015-06-29 ENCOUNTER — Encounter: Payer: Medicare Other | Attending: Physical Medicine & Rehabilitation

## 2015-06-29 VITALS — BP 129/72 | HR 71 | Resp 14

## 2015-06-29 DIAGNOSIS — G8110 Spastic hemiplegia affecting unspecified side: Secondary | ICD-10-CM | POA: Insufficient documentation

## 2015-06-29 DIAGNOSIS — M7989 Other specified soft tissue disorders: Secondary | ICD-10-CM | POA: Insufficient documentation

## 2015-06-29 DIAGNOSIS — G8113 Spastic hemiplegia affecting right nondominant side: Secondary | ICD-10-CM | POA: Diagnosis not present

## 2015-06-29 NOTE — Progress Notes (Signed)
Botox Injection for spasticity using needle EMG guidance  Dilution: 50 Units/ml Indication: Severe spasticity which interferes with ADL,mobility and/or  hygiene and is unresponsive to medication management and other conservative care Informed consent was obtained after describing risks and benefits of the procedure with the patient. This includes bleeding, bruising, infection, excessive weakness, or medication side effects. A REMS form is on file and signed. Needle: 27g 1" needle electrode Number of units per muscle  Biceps100 FCR50 FCU0 FDS50 FDP0 FPL0   All injections were done after obtaining appropriate EMG activity and after negative drawback for blood. The patient tolerated the procedure well. Post procedure instructions were given. A followup appointment was made.  

## 2015-06-29 NOTE — Patient Instructions (Signed)

## 2015-07-02 DIAGNOSIS — Z8673 Personal history of transient ischemic attack (TIA), and cerebral infarction without residual deficits: Secondary | ICD-10-CM | POA: Diagnosis not present

## 2015-07-02 DIAGNOSIS — E1142 Type 2 diabetes mellitus with diabetic polyneuropathy: Secondary | ICD-10-CM | POA: Diagnosis not present

## 2015-07-02 DIAGNOSIS — Z9181 History of falling: Secondary | ICD-10-CM | POA: Diagnosis not present

## 2015-07-02 DIAGNOSIS — Z794 Long term (current) use of insulin: Secondary | ICD-10-CM | POA: Diagnosis not present

## 2015-07-02 DIAGNOSIS — E785 Hyperlipidemia, unspecified: Secondary | ICD-10-CM | POA: Diagnosis not present

## 2015-07-02 DIAGNOSIS — E039 Hypothyroidism, unspecified: Secondary | ICD-10-CM | POA: Diagnosis not present

## 2015-07-15 DIAGNOSIS — H903 Sensorineural hearing loss, bilateral: Secondary | ICD-10-CM | POA: Diagnosis not present

## 2015-07-15 DIAGNOSIS — H9113 Presbycusis, bilateral: Secondary | ICD-10-CM | POA: Diagnosis not present

## 2015-07-15 DIAGNOSIS — R0982 Postnasal drip: Secondary | ICD-10-CM | POA: Diagnosis not present

## 2015-07-15 DIAGNOSIS — H833X3 Noise effects on inner ear, bilateral: Secondary | ICD-10-CM | POA: Diagnosis not present

## 2015-07-15 DIAGNOSIS — R490 Dysphonia: Secondary | ICD-10-CM | POA: Diagnosis not present

## 2015-07-17 ENCOUNTER — Other Ambulatory Visit: Payer: Self-pay | Admitting: Family Medicine

## 2015-07-18 ENCOUNTER — Encounter: Payer: Self-pay | Admitting: Family Medicine

## 2015-07-18 DIAGNOSIS — H9113 Presbycusis, bilateral: Secondary | ICD-10-CM | POA: Insufficient documentation

## 2015-07-22 ENCOUNTER — Ambulatory Visit (INDEPENDENT_AMBULATORY_CARE_PROVIDER_SITE_OTHER): Payer: Medicare Other | Admitting: Neurology

## 2015-07-22 ENCOUNTER — Encounter: Payer: Self-pay | Admitting: Neurology

## 2015-07-22 VITALS — BP 134/70 | HR 76 | Resp 18 | Ht 69.0 in | Wt 186.0 lb

## 2015-07-22 DIAGNOSIS — I639 Cerebral infarction, unspecified: Secondary | ICD-10-CM | POA: Diagnosis not present

## 2015-07-22 DIAGNOSIS — Z9989 Dependence on other enabling machines and devices: Principal | ICD-10-CM

## 2015-07-22 DIAGNOSIS — G8191 Hemiplegia, unspecified affecting right dominant side: Secondary | ICD-10-CM | POA: Diagnosis not present

## 2015-07-22 DIAGNOSIS — G4733 Obstructive sleep apnea (adult) (pediatric): Secondary | ICD-10-CM | POA: Diagnosis not present

## 2015-07-22 NOTE — Patient Instructions (Addendum)
Please continue using your CPAP regularly. While your insurance requires that you use CPAP at least 4 hours each night on 70% of the nights, I recommend, that you not skip any nights and use it throughout the night if you can. Getting used to CPAP and staying with the treatment long term does take time and patience and discipline. Untreated obstructive sleep apnea when it is moderate to severe can have an adverse impact on cardiovascular health and raise her risk for heart disease, arrhythmias, hypertension, congestive heart failure, stroke and diabetes. Untreated obstructive sleep apnea causes sleep disruption, nonrestorative sleep, and sleep deprivation. This can have an impact on your day to day functioning and cause daytime sleepiness and impairment of cognitive function, memory loss, mood disturbance, and problems focussing. Using CPAP regularly can improve these symptoms.  Keep up the good work! I will see you back in 1 year for sleep apnea check up.    You can try Melatonin at night for sleep: take 3 to 5 mg one to 2 hours before your bedtime.

## 2015-07-22 NOTE — Progress Notes (Signed)
Subjective:    Patient ID: James Bell is a 81 y.o. male.  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 The patient is unaccompanied today. I last saw him on 01/19/2015, at which time he reported compliance with CPAP therapy. He preferred a CPAP pressure of 9 cm as opposed to 8 cm. He was exercising regularly. He was receiving Botox injections for his post stroke spasticity under Dr. Letta Pate with good results. He was using a cane on his left side for mobility.  Today, 07/22/2015: I reviewed his CPAP compliance data from 06/20/2015 through 07/19/2015 which is a total of 30 days during which time he used his machine 27 days with percent used days greater than 4 hours at 70%, indicating adequate compliance with an average usage of 4 hours and 41 minutes, residual AHI low at 1.9 per hour, leak at times high with the 95th percentile at 28.5 L/m on a pressure of 9 cm with EPR of 3.  Today, 07/22/2015: He reports no new issues. Some irritability from the mask, has some ongoing difficulty with sleep maintenance. He was in the ER on 03/18/15 for worsening right sided weakness and was noted to be dehydrated and low blood sugar, he had echo, C. doppler, EEG, and MRI brain with neg. w/u. He is back to baseline and admits to not drinking enough water, averaging about 4 glasses per day. He drinks 1 cup of coffee in the morning. He tries to exercise regularly. He lives alone. His neighbor checks on him he states. He has a prescription for trazodone but does not like to take it. He has  not tried melatonin.    Previously:  I saw him on 01/08/2014, at which time I suggested we bring him back for sleep study. He had a split-night sleep study on 01/13/2014 and we talked about his sleep test results and based on the test results I had prescribed CPAP therapy for him. His sleep efficiency at baseline was 69.9% with a sleep latency of 15 minutes and wake after sleep onset of 21 minutes with severe sleep fragmentation noted. He had a markedly elevated arousal index. He had absence of slow-wave sleep and absence of REM sleep prior to CPAP. He had rare PVCs and PACs on EKG. He had no PLMS. He had mild intermittent snoring. He had a total AHI of 58.9 per hour, baseline oxygen saturation was 94%, nadir was 85% during non-REM sleep. He was titrated on CPAP therapy for the rest of the night. Sleep efficiency was 70.1%. Wake after sleep onset was 79.5 minutes with mild to moderate sleep fragmentation noted. He achieved slow-wave sleep and REM sleep after CPAP. He was titrated from 5-8 cm. His AHI was 0 per hour on a pressure of 8. Supine REM sleep was achieved. Based on the test results are prescribed CPAP therapy at home. Of note, he no showed for an appointment on 01/14/2015.  I first met him on 04/04/2013 at which time I suggested an autoPAP trial for 30 days. His old CPAP machine was set for 10 cm, but was defective and he bought a  new machine on his own, which was set at 7 cm, then increased to 9 cm, as I understand.   His typical bedtime is reported to be around 9 to 10 PM and usual wake time is around 5 AM. Sleep onset typically occurs within a few minutes. He reports feeling not well rested upon awakening. He wakes up on an average 2 in the middle of the night and has to go to the bathroom 2 times on a typical night. He denies recent morning headaches.   He reports excessive daytime somnolence (EDS) and His Epworth Sleepiness Score (ESS) is 15/24 today. He has been known to snore for the past  many years. He had 2 sleep studies in the past and he has records today, which I reviewed: He had a baseline sleep study on 12/26/2007 at [facility] in [place], [place]. His overall AHI was 40 per hour. His oxygen nadir was 78%. Moderate to severe sleep fragmentation was noted and an increased arousal index. He had periodic leg movements of sleep. He had a CPAP titration study on 05/21/2008. He had periodic leg movements of sleep, oxygen nadir was 92% for the night.   He feels that the pressure is not adequate as he does not wake up rested and has daytime somnolence. He feels that he may be snoring despite using CPAP. He had another machine which could not be fixed. He goes to the gym and drives a car. He lives alone and cooks for himself. He uses a 4 prong cane on the left side to mobilize. He is up for Botox injections in the right upper extremity.    His Past Medical History Is Significant For: Past Medical History  Diagnosis Date  . HLD (hyperlipidemia)   . Restless leg syndrome     well controlled on mirapex  . OSA on CPAP     sleep study 01/2014 - severe, CPAP at 10, previously on nuvigil (Dr. [name])  . Type 2 diabetes, controlled, with neuropathy [facility]) 2011    [name] in [place]  . H/O hiatal hernia   . GERD (gastroesophageal reflux disease)     with sliding HH  . Migraines 03/11/12    now rare  . CVA (cerebral infarction) 03/07/12    slurred speech; right hemplegia, left handed - completed PT 07/2014 (17 sessions)  . Basal cell carcinoma of nose 2002    bridge of nose  . History of kidney problems 12/2010    lacerated left kidney after fall  . BPH with obstruction/lower urinary tract symptoms     failed flomax, uroxatral, myrbetriq - sees urology [name]) pending videourodynamics  . Depression     mild, on cymbalta per prior PCP  . Palpitations 01/2012    holter WNL, rare PAC/PVCs  . Erectile dysfunction     per urology (prostaglandin injection)  .  Kidney cysts 02/2014    bilateral (renal US)  . CKD (chronic kidney disease) stage 3, GFR 30-59 ml/min 03/11/2012    Renal US 02/2014 - multiple kidney cysts   . TIA (transient ischemic attack)   . Anemia   . Presbycusis of both ears 2016    rec amplification [facility])    His Past Surgical History Is Significant For: Past Surgical History  Procedure Laterality Date  . Prostate surgery  02/2009    PVP (laser)  . Tonsillectomy and adenoidectomy  ~ 1945  . Hemorrhoid surgery  1991  . Cataract extraction w/ intraocular lens implant  11//2012 - 08/2011  . Inguinal hernia repair  1981; 2002    left, then repaired  . Skin cancer excision  2002    bridge of nose, BCC  . Mri brain  04/2010    WNL  . Esophagogastroduodenoscopy  2006    duodenitis, medual HH, Grade 2 reflux, mild gastritis  . Colonoscopy  03/2011    poor prep, unable to biopsy polyp in tic fold, rpt 3 mo [name])  . Abi  2007    WNL  . Urethral dilation  2014    tannenbaum  . Cystoscopy with urethral dilatation N/A 07/24/2013    Procedure: CYSTOSCOPY WITH URETHRAL DILATATION;  Surgeon: [name], MD;  Location: [facility];  Service: Urology;  Laterality: N/A;  . Transurethral incision of prostate N/A 07/24/2013    Procedure: TRANSURETHRAL INCISION OF THE PROSTATE (TUIP);  Surgeon: [name], MD;  Location: [facility];  Service: Urology;  Laterality: N/A;  . Esophagogastroduodenoscopy  07/2011    mild chronic gastritis  . Colonoscopy  06/2011    mult polyps adenomatous, severe diverticulosis, rpt 3 yrs [name])  . Carotid endarterectomy    . Cardiac catheterization  2004    normal; [facility]  . Cardiovascular stress test  02/2014    WNL, low risk scan, EF 68%    His Family History Is Significant For: Family History  Problem Relation Age of Onset  . Stroke Father   . Lung cancer Father 78     smoker  . Stroke Brother   . Diabetes Brother 69  . Coronary artery disease Neg Hx   . Lung cancer Brother   .  Alcoholism Sister   . Kidney disease Sister     His Social History Is Significant For: Social History   Social History  . Marital Status: Unknown    Spouse Name: N/A  . Number of Children: 2  . Years of Education: MD   Occupational History  . retired   .     Social History Main Topics  . Smoking status: Never Smoker   . Smokeless tobacco: Never Used     Comment: "used to smoke pipe when I was younger"  . Alcohol Use: 2.4 oz/week    2 Glasses of wine, 2 Shots of liquor per week     Comment: occasional  . Drug Use: No  . Sexual Activity: Yes   Other Topics Concern  . None   Social History Narrative   Caffeine: 1 cup coffee/day    Occupation: ophthalmologist, retired   Edu: MD   Activity: going to gym 3x/wk with trainer   Diet: some water, fruits/vegetables some, red meat a few times, fish 1x/wk   POA - [name] (Daughter)    His Allergies Are:  Allergies  Allergen Reactions  . Lisinopril Cough    tachycardia  . Nexium [Esomeprazole] Other (See Comments)    Headache  . Niacin And Related Other (See Comments)    flushing  :   His Current Medications Are:  Outpatient Encounter Prescriptions as of 07/22/2015  Medication Sig  . atorvastatin (LIPITOR) 40 MG tablet TAKE 1 TABLET DAILY  . clopidogrel (PLAVIX) 75 MG tablet TAKE 1 TABLET DAILY  . clotrimazole (LOTRIMIN AF) 1 % cream Apply 1 application topically 2 (two) times daily.  . Coenzyme Q10 (COQ10) 150 MG CAPS Take 1 capsule by mouth 2 (two) times daily.  . diazepam (VALIUM) 5 MG tablet Take 1 tablet (5 mg total) by mouth  every 8 (eight) hours as needed for muscle spasms.  . DULoxetine (CYMBALTA) 60 MG capsule TAKE 1 CAPSULE DAILY  . Folic Acid-Vit S4-HQP R91 (FOLBEE) 2.5-25-1 MG TABS tablet Take 1 tablet by mouth daily.  Marland Kitchen ibuprofen (ADVIL,MOTRIN) 200 MG tablet Take 400 mg by mouth every 6 (six) hours as needed for mild pain or moderate pain.  . Insulin Degludec (TRESIBA FLEXTOUCH) 200 UNIT/ML SOPN  Inject 60 Units into the skin at bedtime.  Marland Kitchen levothyroxine (LEVOTHROID) 25 MCG tablet Take 1 tablet (25 mcg total) by mouth daily before breakfast. Per endo [name]  . losartan (COZAAR) 25 MG tablet Take 1 tablet (25 mg total) by mouth daily.  . metoprolol tartrate (LOPRESSOR) 25 MG tablet Take 0.5 tablets (12.5 mg total) by mouth 2 (two) times daily.  Marland Kitchen NITROSTAT 0.4 MG SL tablet DISSOLVE 1 TABLET UNDER THE TONGUE EVERY 5 MINUTES AS NEEDED FOR CHEST PAIN  . NOVOFINE 30G X 8 MM MISC USE ONE DAILY AS DIRECTED  . omega-3 acid ethyl esters (LOVAZA) 1 G capsule TAKE 2 CAPSULES TWICE A DAY  . omeprazole (PRILOSEC) 40 MG capsule Take 40 mg by mouth daily.  . pramipexole (MIRAPEX) 1 MG tablet Take 2 tablets (2 mg total) by mouth at bedtime.  . traZODone (DESYREL) 50 MG tablet Take 25 mg by mouth at bedtime as needed.  . vitamin C (ASCORBIC ACID) 500 MG tablet Take 1,000 mg by mouth daily.    No facility-administered encounter medications on file as of 07/22/2015.  :  Review of Systems:  Out of a complete 14 point review of systems, all are reviewed and negative with the exception of these symptoms as listed below:   Review of Systems  Neurological:       Patient reports no problems with CPAP use, just finds it irritating. States that he is not sleeping very well at night.     Objective:  Neurologic Exam  Physical Exam Physical Examination:   Filed Vitals:   07/22/15 1135  BP: 134/70  Pulse: 76  Resp: 18   General Examination: The patient is a very pleasant 81 y.o. male in no acute distress. He appears well-developed and well-nourished and well groomed. He is in good spirits today.  HEENT: Normocephalic, atraumatic, pupils are equal, round and reactive to light and accommodation. Extraocular tracking is good without limitation to gaze excursion or nystagmus noted. Normal smooth pursuit is noted. Hearing is grossly intact with hearing aid in place. Face is asymmetric with mild R lower  facial weakness. Speech is minimally dysarthric and hypophonic. There is no lip, neck/head, jaw or voice tremor. Neck is supple with full range of passive and active motion. There are no carotid bruits on auscultation. Oropharynx exam reveals: mild mouth dryness, adequate dental hygiene and mild airway crowding.   Chest: Clear to auscultation without wheezing, rhonchi or crackles noted.  Heart: S1+S2+0, regular and normal without murmurs, rubs or gallops noted.   Abdomen: Soft, non-tender and non-distended with normal bowel sounds appreciated on auscultation.  Extremities: There is trace pitting edema in the R ankle. He is wearing an AFO on the right.    Skin: Warm and dry without trophic changes noted. There are no varicose veins.  Musculoskeletal: exam reveals no obvious joint deformities, tenderness or joint swelling or erythema.   Neurologically:  Mental status: The patient is awake, alert and oriented in all 4 spheres. His memory, attention, language and knowledge are appropriate. There is no aphasia, agnosia, apraxia or  anomia. Speech is clear with normal prosody and enunciation. Thought process is linear. Mood is congruent and affect is normal.  Cranial nerves are as described above under HEENT exam. In addition, shoulder shrug is normal with equal shoulder height noted. Motor exam: Normal bulk, strength and tone is noted on the L. On the R, there is 4/5 weakness in the RUE with increase in tone and 4/5 weakness in the RLE with spasticity noted.  he has mild difficulty opening his right hand. He has hyperreflexia on the right. Sensory exam is intact to light touch. Gait, station and balance: he stands with mild difficulty and uses a 4 prong cane on the L with mild circumduction on the RLE and flexion of his RUE.    Assessment and Plan:   In summary, James Bell is a very pleasant 81 year old male with a history of Diabetes, migraine headaches, restless leg syndrome with PLMs,  stroke in June 2013 with residual right-sided weakness (on Botox), as well as a prior diagnosis of severe obstructive sleep apnea who has been using a CPAP machine that he bought himself set at a pressure of 9 cm in the interim, I saw him back a year ago and ordered another sleep study. He had a split-night sleep study in April 2015 which we reviewed today. He did well with CPAP of 8 cm which I prescribed but subjectively he felt better when he was using CPAP of 9 cm. He had an episode of worsening right-sided weakness in June. He was hospitalized for one day. He had full workup for a new stroke which was negative. He was noted to have low blood sugar values and was told he was dehydrated. He is advised to stay well-hydrated and monitor his symptoms. As far as his sleep apnea goes, he is doing well. He has switched DME companies as well. He has a new CPAP machine now. His physical exam is stable. I suggested he could try melatonin for sleep, 3-5 mg, 1-2 hours before bedtime. He is reminded to try to keep a schedule for his sleep. Since he is doing well from my end of things, I suggested a 1 year FU for OSA. He was in agreement.  I spent 20 minutes in total face-to-face time with the patient, more than 50% of which was spent in counseling and coordination of care, reviewing test results, reviewing medication and discussing or reviewing the diagnosis of OSA, its prognosis and treatment options.

## 2015-08-05 DIAGNOSIS — C44319 Basal cell carcinoma of skin of other parts of face: Secondary | ICD-10-CM | POA: Diagnosis not present

## 2015-08-05 DIAGNOSIS — L821 Other seborrheic keratosis: Secondary | ICD-10-CM | POA: Diagnosis not present

## 2015-08-05 DIAGNOSIS — D1801 Hemangioma of skin and subcutaneous tissue: Secondary | ICD-10-CM | POA: Diagnosis not present

## 2015-08-05 DIAGNOSIS — D225 Melanocytic nevi of trunk: Secondary | ICD-10-CM | POA: Diagnosis not present

## 2015-08-05 DIAGNOSIS — L570 Actinic keratosis: Secondary | ICD-10-CM | POA: Diagnosis not present

## 2015-08-23 ENCOUNTER — Other Ambulatory Visit: Payer: Self-pay | Admitting: Family Medicine

## 2015-08-24 ENCOUNTER — Ambulatory Visit (HOSPITAL_BASED_OUTPATIENT_CLINIC_OR_DEPARTMENT_OTHER): Payer: Medicare Other | Admitting: Physical Medicine & Rehabilitation

## 2015-08-24 ENCOUNTER — Encounter: Payer: Medicare Other | Attending: Physical Medicine & Rehabilitation

## 2015-08-24 ENCOUNTER — Encounter: Payer: Self-pay | Admitting: Physical Medicine & Rehabilitation

## 2015-08-24 VITALS — BP 130/64 | HR 65 | Resp 14

## 2015-08-24 DIAGNOSIS — E785 Hyperlipidemia, unspecified: Secondary | ICD-10-CM | POA: Diagnosis not present

## 2015-08-24 DIAGNOSIS — N183 Chronic kidney disease, stage 3 (moderate): Secondary | ICD-10-CM | POA: Diagnosis not present

## 2015-08-24 DIAGNOSIS — E039 Hypothyroidism, unspecified: Secondary | ICD-10-CM | POA: Diagnosis not present

## 2015-08-24 DIAGNOSIS — G8110 Spastic hemiplegia affecting unspecified side: Secondary | ICD-10-CM | POA: Diagnosis not present

## 2015-08-24 DIAGNOSIS — M7989 Other specified soft tissue disorders: Secondary | ICD-10-CM | POA: Diagnosis not present

## 2015-08-24 DIAGNOSIS — Z794 Long term (current) use of insulin: Secondary | ICD-10-CM | POA: Diagnosis not present

## 2015-08-24 DIAGNOSIS — E1142 Type 2 diabetes mellitus with diabetic polyneuropathy: Secondary | ICD-10-CM | POA: Diagnosis not present

## 2015-08-24 DIAGNOSIS — I639 Cerebral infarction, unspecified: Secondary | ICD-10-CM | POA: Diagnosis not present

## 2015-08-24 DIAGNOSIS — I129 Hypertensive chronic kidney disease with stage 1 through stage 4 chronic kidney disease, or unspecified chronic kidney disease: Secondary | ICD-10-CM | POA: Diagnosis not present

## 2015-08-24 DIAGNOSIS — Z7984 Long term (current) use of oral hypoglycemic drugs: Secondary | ICD-10-CM | POA: Diagnosis not present

## 2015-08-24 DIAGNOSIS — E1121 Type 2 diabetes mellitus with diabetic nephropathy: Secondary | ICD-10-CM | POA: Diagnosis not present

## 2015-08-24 NOTE — Patient Instructions (Signed)
We will reduce the dosage to the right flexor digitorum superficialis muscle  From 50 units to 25 units

## 2015-08-24 NOTE — Progress Notes (Signed)
Subjective:    Patient ID: TRESTEN ELWOOD, male    DOB: 04-15-1931, 81 y.o.   MRN: [id number]  HPI  Finger flexor weakness after botox, Starting to get stronger again. Felt bad for about a week after the last Botox injection. Walking up to 1/3 mile per day, outside Pain Inventory Average Pain 0 Pain Right Now 0 My pain is no pain  In the last 24 hours, has pain interfered with the following? General activity 0 Relation with others 0 Enjoyment of life 0 What TIME of day is your pain at its worst? no pain Sleep (in general) Fair  Pain is worse with: no pain Pain improves with: no pain Relief from Meds: no pain  Mobility walk with assistance how many minutes can you walk? 5 ability to climb steps?  yes do you drive?  yes use a wheelchair transfers alone Do you have any goals in this area?  yes  Function I need assistance with the following:  household duties Do you have any goals in this area?  yes  Neuro/Psych No problems in this area  Prior Studies Any changes since last visit?  no  Physicians involved in your care Any changes since last visit?  yes   Family History  Problem Relation Age of Onset  . Stroke Father   . Lung cancer Father 49     smoker  . Stroke Brother   . Diabetes Brother 20  . Coronary artery disease Neg Hx   . Lung cancer Brother   . Alcoholism Sister   . Kidney disease Sister    Social History   Social History  . Marital Status: Unknown    Spouse Name: N/A  . Number of Children: 2  . Years of Education: MD   Occupational History  . retired   .     Social History Main Topics  . Smoking status: Never Smoker   . Smokeless tobacco: Never Used     Comment: "used to smoke pipe when I was younger"  . Alcohol Use: 2.4 oz/week    2 Glasses of wine, 2 Shots of liquor per week     Comment: occasional  . Drug Use: No  . Sexual Activity: Yes   Other Topics Concern  . None   Social History Narrative   Caffeine: 1 cup  coffee/day    Occupation: ophthalmologist, retired   Edu: MD   Activity: going to gym 3x/wk with trainer   Diet: some water, fruits/vegetables some, red meat a few times, fish 1x/wk   POA - [name] (Daughter)   Past Surgical History  Procedure Laterality Date  . Prostate surgery  02/2009    PVP (laser)  . Tonsillectomy and adenoidectomy  ~ 1945  . Hemorrhoid surgery  1991  . Cataract extraction w/ intraocular lens implant  11//2012 - 08/2011  . Inguinal hernia repair  1981; 2002    left, then repaired  . Skin cancer excision  2002    bridge of nose, BCC  . Mri brain  04/2010    WNL  . Esophagogastroduodenoscopy  2006    duodenitis, medual HH, Grade 2 reflux, mild gastritis  . Colonoscopy  03/2011    poor prep, unable to biopsy polyp in tic fold, rpt 3 mo [name])  . Abi  2007    WNL  . Urethral dilation  2014    [facility]annenbaum  . Cystoscopy with urethral dilatation N/A 07/24/2013    Procedure: CYSTOSCOPY WITH URETHRAL DILATATION;  Surgeon: [name], MD;  Location: [facility];  Service: Urology;  Laterality: N/A;  . Transurethral incision of prostate N/A 07/24/2013    Procedure: TRANSURETHRAL INCISION OF THE PROSTATE (TUIP);  Surgeon: [name], MD;  Location: [facility];  Service: Urology;  Laterality: N/A;  . Esophagogastroduodenoscopy  07/2011    mild chronic gastritis  . Colonoscopy  06/2011    mult polyps adenomatous, severe diverticulosis, rpt 3 yrs [name])  . Carotid endarterectomy    . Cardiac catheterization  2004    normal; [facility]  . Cardiovascular stress test  02/2014    WNL, low risk scan, EF 68%   Past Medical History  Diagnosis Date  . HLD (hyperlipidemia)   . Restless leg syndrome     well controlled on mirapex  . OSA on CPAP     sleep study 01/2014 - severe, CPAP at 10, previously on nuvigil (Dr. [name])  . Type 2 diabetes, controlled, with neuropathy [facility]) 2011    [name] in [place]  . H/O hiatal hernia   . GERD (gastroesophageal  reflux disease)     with sliding HH  . Migraines 03/11/12    now rare  . CVA (cerebral infarction) 03/07/12    slurred speech; right hemplegia, left handed - completed PT 07/2014 (17 sessions)  . Basal cell carcinoma of nose 2002    bridge of nose  . History of kidney problems 12/2010    lacerated left kidney after fall  . BPH with obstruction/lower urinary tract symptoms     failed flomax, uroxatral, myrbetriq - sees urology [name]) pending videourodynamics  . Depression     mild, on cymbalta per prior PCP  . Palpitations 01/2012    holter WNL, rare PAC/PVCs  . Erectile dysfunction     per urology (prostaglandin injection)  . Kidney cysts 02/2014    bilateral (renal US)  . CKD (chronic kidney disease) stage 3, GFR 30-59 ml/min 03/11/2012    Renal US 02/2014 - multiple kidney cysts   . TIA (transient ischemic attack)   . Anemia   . Presbycusis of both ears 2016    rec amplification ([place])   BP [id number] mmHg  Pulse 65  Resp 14  SpO2 97%  Opioid Risk Score:   Fall Risk Score:  `1  Depression screen PHQ 2/9  Depression screen [facility] 2/9 06/23/2015 04/08/2015 11/06/2013 07/05/2012  Decreased Interest 0 0 1 0  Down, Depressed, Hopeless 0 0 1 0  PHQ - 2 Score 0 0 2 0  Altered sleeping - - 3 -  Tired, decreased energy - - 1 -  Change in appetite - - 1 -  Feeling bad or failure about yourself  - - 0 -  Trouble concentrating - - 1 -  Moving slowly or fidgety/restless - - 0 -  Suicidal thoughts - - 0 -  PHQ-9 Score - - 8 -      Review of Systems  All other systems reviewed and are negative.      Objective:   Physical Exam  3 minus supination pronation, 3 minus deltoid 3 minus grip, 3/5 wrist extension, 3/5 biceps, 3 minus triceps. 5- R HF, KE     Assessment & Plan:  1. Excessive weakness of right finger flexors after Botox injection, will reduce Botox dose to the flexor digitorum superficialis from 50 units to 25 units Will continue with 50 units to the FCR and 100  units to the biceps

## 2015-09-07 ENCOUNTER — Other Ambulatory Visit: Payer: Self-pay | Admitting: Family Medicine

## 2015-09-30 ENCOUNTER — Other Ambulatory Visit: Payer: Self-pay | Admitting: Family Medicine

## 2015-10-05 ENCOUNTER — Encounter: Payer: Medicare Other | Attending: Physical Medicine & Rehabilitation

## 2015-10-05 ENCOUNTER — Ambulatory Visit (HOSPITAL_BASED_OUTPATIENT_CLINIC_OR_DEPARTMENT_OTHER): Payer: Medicare Other | Admitting: Physical Medicine & Rehabilitation

## 2015-10-05 ENCOUNTER — Encounter: Payer: Self-pay | Admitting: Physical Medicine & Rehabilitation

## 2015-10-05 VITALS — BP 141/64 | HR 77

## 2015-10-05 DIAGNOSIS — Z8673 Personal history of transient ischemic attack (TIA), and cerebral infarction without residual deficits: Secondary | ICD-10-CM | POA: Diagnosis not present

## 2015-10-05 DIAGNOSIS — I69398 Other sequelae of cerebral infarction: Secondary | ICD-10-CM | POA: Insufficient documentation

## 2015-10-05 DIAGNOSIS — R269 Unspecified abnormalities of gait and mobility: Secondary | ICD-10-CM

## 2015-10-05 DIAGNOSIS — I69351 Hemiplegia and hemiparesis following cerebral infarction affecting right dominant side: Secondary | ICD-10-CM

## 2015-10-05 DIAGNOSIS — M7989 Other specified soft tissue disorders: Secondary | ICD-10-CM | POA: Diagnosis not present

## 2015-10-05 DIAGNOSIS — G8110 Spastic hemiplegia affecting unspecified side: Secondary | ICD-10-CM | POA: Diagnosis not present

## 2015-10-05 NOTE — Progress Notes (Signed)
Subjective:    Patient ID: James Bell, male    DOB: 30-Jul-1931, 82 y.o.   MRN: [id number]  HPI Fall at home,  Backward, In the dark, no injury  Patient not sure of his leg feels weaker or not.  Right hand weakness finally back to baseline, biceps almost back to baseline. Last Botox injection October 2016 Biceps100 FCR50 FCU0 FDS50 FDP0 FPL0  The patient doesn't feel a lot of tone at this point in the hand or elbow. Pain Inventory Average Pain 0 Pain Right Now 0 My pain is no pain  In the last 24 hours, has pain interfered with the following? General activity 0 Relation with others 0 Enjoyment of life 0 What TIME of day is your pain at its worst? no pain Sleep (in general) no pain  Pain is worse with: no pain Pain improves with: no pain Relief from Meds: no pain  Mobility use a cane use a walker  Function retired  Neuro/Psych No problems in this area  Prior Studies Any changes since last visit?  no  Physicians involved in your care Any changes since last visit?  no   Family History  Problem Relation Age of Onset  . Stroke Father   . Lung cancer Father 44     smoker  . Stroke Brother   . Diabetes Brother 8  . Coronary artery disease Neg Hx   . Lung cancer Brother   . Alcoholism Sister   . Kidney disease Sister    Social History   Social History  . Marital Status: Unknown    Spouse Name: N/A  . Number of Children: 2  . Years of Education: MD   Occupational History  . retired   .     Social History Main Topics  . Smoking status: Never Smoker   . Smokeless tobacco: Never Used     Comment: "used to smoke pipe when I was younger"  . Alcohol Use: 2.4 oz/week    2 Glasses of wine, 2 Shots of liquor per week     Comment: occasional  . Drug Use: No  . Sexual Activity: Yes   Other Topics Concern  . None   Social History Narrative   Caffeine: 1 cup coffee/day    Occupation: ophthalmologist, retired   Edu: MD   Activity:  going to gym 3x/wk with trainer   Diet: some water, fruits/vegetables some, red meat a few times, fish 1x/wk   POA - James Bell (Daughter)   Past Surgical History  Procedure Laterality Date  . Prostate surgery  02/2009    PVP (laser)  . Tonsillectomy and adenoidectomy  ~ 1945  . Hemorrhoid surgery  1991  . Cataract extraction w/ intraocular lens implant  11//2012 - 08/2011  . Inguinal hernia repair  1981; 2002    left, then repaired  . Skin cancer excision  2002    bridge of nose, BCC  . Mri brain  04/2010    WNL  . Esophagogastroduodenoscopy  2006    duodenitis, medual HH, Grade 2 reflux, mild gastritis  . Colonoscopy  03/2011    poor prep, unable to biopsy polyp in tic fold, rpt 3 mo [name])  . Abi  2007    WNL  . Urethral dilation  2014    tannenbaum  . Cystoscopy with urethral dilatation N/A 07/24/2013    Procedure: CYSTOSCOPY WITH URETHRAL DILATATION;  Surgeon: [name], MD;  Location: [facility];  Service: Urology;  Laterality:  N/A;  . Transurethral incision of prostate N/A 07/24/2013    Procedure: TRANSURETHRAL INCISION OF THE PROSTATE (TUIP);  Surgeon: [name], MD;  Location: [facility];  Service: Urology;  Laterality: N/A;  . Esophagogastroduodenoscopy  07/2011    mild chronic gastritis  . Colonoscopy  06/2011    mult polyps adenomatous, severe diverticulosis, rpt 3 yrs [name])  . Carotid endarterectomy    . Cardiac catheterization  2004    normal; [facility]  . Cardiovascular stress test  02/2014    WNL, low risk scan, EF 68%   Past Medical History  Diagnosis Date  . HLD (hyperlipidemia)   . Restless leg syndrome     well controlled on mirapex  . OSA on CPAP     sleep study 01/2014 - severe, CPAP at 10, previously on nuvigil (Dr. [name])  . Type 2 diabetes, controlled, with neuropathy [facility]) 2011    [name] in [place]  . H/O hiatal hernia   . GERD (gastroesophageal reflux disease)     with sliding HH  . Migraines 03/11/12    now rare  . CVA  (cerebral infarction) 03/07/12    slurred speech; right hemplegia, left handed - completed PT 07/2014 (17 sessions)  . Basal cell carcinoma of nose 2002    bridge of nose  . History of kidney problems 12/2010    lacerated left kidney after fall  . BPH with obstruction/lower urinary tract symptoms     failed flomax, uroxatral, myrbetriq - sees urology [name]) pending videourodynamics  . Depression     mild, on cymbalta per prior PCP  . Palpitations 01/2012    holter WNL, rare PAC/PVCs  . Erectile dysfunction     per urology (prostaglandin injection)  . Kidney cysts 02/2014    bilateral (renal US)  . CKD (chronic kidney disease) stage 3, GFR 30-59 ml/min 03/11/2012    Renal US 02/2014 - multiple kidney cysts   . TIA (transient ischemic attack)   . Anemia   . Presbycusis of both ears 2016    rec amplification ([place])   BP [id number] mmHg  Pulse 77  SpO2 99%  Opioid Risk Score:   Fall Risk Score:  `1  Depression screen PHQ 2/9  Depression screen [facility] 2/9 10/05/2015 06/23/2015 04/08/2015 11/06/2013 07/05/2012  Decreased Interest 0 0 0 1 0  Down, Depressed, Hopeless 0 0 0 1 0  PHQ - 2 Score 0 0 0 2 0  Altered sleeping - - - 3 -  Tired, decreased energy - - - 1 -  Change in appetite - - - 1 -  Feeling bad or failure about yourself  - - - 0 -  Trouble concentrating - - - 1 -  Moving slowly or fidgety/restless - - - 0 -  Suicidal thoughts - - - 0 -  PHQ-9 Score - - - 8 -     Review of Systems  Neurological:       New balance issues  All other systems reviewed and are negative.      Objective:   Physical Exam 3 minus supination pronation, 3 minus deltoid 3 minus grip, 3/5 wrist extension, 3/5 biceps, 3 minus triceps. 5- R HF, KE Finger extension is 3+, grip is 4 minus on the right side  Tone Modified Ashworth 1 at the biceps 0 at the finger flexors and wrist flexors Left-sided strength is normal      Assessment & Plan:  1. Right spastic hemiplegia secondary to  CVA.Motor strength overall at baseline. No new events suspected Patient requiring less Botox. Had excessive tone reduction and some weakness in the finger flexors as well as the bicep in the right upper extremity. Would recommend reducing overall Botox dose to 100 units next time but I would hold off until his Ashworth score gets up to around a 3 Would plan 25 units into the FCR, 25 units into the FDS and 50 units into the biceps  2. Gait disorder secondary to CVA he was walking with a small-based quad cane but is back to his Rollator, do think he would benefit from outpatient PT will make referral  Return to clinic in 4 weeks

## 2015-10-05 NOTE — Patient Instructions (Signed)
Will monitor tone in the right upper extremity If it increases to the point where it interferes with movement or with daily living would recommend repeating Botox but at one half of the dose as last time

## 2015-10-19 ENCOUNTER — Other Ambulatory Visit: Payer: Self-pay | Admitting: *Deleted

## 2015-10-19 MED ORDER — CLOPIDOGREL BISULFATE 75 MG PO TABS: ORAL_TABLET | ORAL | Status: DC

## 2015-10-19 MED ORDER — DULOXETINE HCL 60 MG PO CPEP: 60.0000 mg | ORAL_CAPSULE | Freq: Every day | ORAL | Status: DC

## 2015-10-19 MED ORDER — FOLIC ACID-VIT B6-VIT B12 2.5-25-1 MG PO TABS: 1.0000 | ORAL_TABLET | Freq: Every day | ORAL | Status: DC

## 2015-10-19 MED ORDER — TRAZODONE HCL 50 MG PO TABS: 25.0000 mg | ORAL_TABLET | Freq: Every evening | ORAL | Status: DC | PRN

## 2015-10-19 MED ORDER — DIAZEPAM 5 MG PO TABS: 5.0000 mg | ORAL_TABLET | Freq: Three times a day (TID) | ORAL | Status: DC | PRN

## 2015-10-19 NOTE — Telephone Encounter (Signed)
Printed and in Kim's box 

## 2015-10-19 NOTE — Telephone Encounter (Signed)
Ok to refill to mail order? Will need written script to fax. Thanks!

## 2015-10-19 NOTE — Telephone Encounter (Signed)
Rx's faxed to mail order.  

## 2015-10-20 ENCOUNTER — Encounter: Payer: Self-pay | Admitting: Physical Therapy

## 2015-10-20 ENCOUNTER — Ambulatory Visit: Payer: Medicare Other | Attending: Physical Medicine & Rehabilitation | Admitting: Physical Therapy

## 2015-10-20 DIAGNOSIS — R2689 Other abnormalities of gait and mobility: Secondary | ICD-10-CM

## 2015-10-20 DIAGNOSIS — R269 Unspecified abnormalities of gait and mobility: Secondary | ICD-10-CM | POA: Insufficient documentation

## 2015-10-20 DIAGNOSIS — R29818 Other symptoms and signs involving the nervous system: Secondary | ICD-10-CM | POA: Diagnosis not present

## 2015-10-20 DIAGNOSIS — M6289 Other specified disorders of muscle: Secondary | ICD-10-CM

## 2015-10-20 DIAGNOSIS — M6281 Muscle weakness (generalized): Secondary | ICD-10-CM | POA: Insufficient documentation

## 2015-10-20 DIAGNOSIS — R279 Unspecified lack of coordination: Secondary | ICD-10-CM | POA: Diagnosis not present

## 2015-10-20 DIAGNOSIS — R278 Other lack of coordination: Secondary | ICD-10-CM

## 2015-10-20 DIAGNOSIS — I6980 Unspecified sequelae of other cerebrovascular disease: Secondary | ICD-10-CM | POA: Insufficient documentation

## 2015-10-20 DIAGNOSIS — I69951 Hemiplegia and hemiparesis following unspecified cerebrovascular disease affecting right dominant side: Secondary | ICD-10-CM | POA: Diagnosis not present

## 2015-10-20 DIAGNOSIS — R531 Weakness: Secondary | ICD-10-CM | POA: Diagnosis not present

## 2015-10-20 DIAGNOSIS — I69898 Other sequelae of other cerebrovascular disease: Secondary | ICD-10-CM | POA: Diagnosis not present

## 2015-10-20 DIAGNOSIS — IMO0002 Reserved for concepts with insufficient information to code with codable children: Secondary | ICD-10-CM

## 2015-10-20 DIAGNOSIS — R2681 Unsteadiness on feet: Secondary | ICD-10-CM | POA: Insufficient documentation

## 2015-10-20 DIAGNOSIS — M6249 Contracture of muscle, multiple sites: Secondary | ICD-10-CM | POA: Diagnosis not present

## 2015-10-21 NOTE — Therapy (Signed)
[facility] [address] [place], [name], [address] Phone: [phone]   Fax:  [phone]  Physical Therapy Evaluation  Patient Details  Name: James Bell MRN: [id number] Date of Birth: [birth year] Referring Provider: [name], MD  Encounter Date: 10/20/2015      PT End of Session - 10/20/15 1210    Visit Number 1   Number of Visits 17   Date for PT Re-Evaluation 12/17/15   Authorization Type Medicare G-Code & Progress Note every 10 visits   PT Start Time 0935   PT Stop Time 1015   PT Time Calculation (min) 40 min   Equipment Utilized During Treatment Gait belt   Activity Tolerance Patient tolerated treatment well   Behavior During Therapy [facility] for tasks assessed/performed      Past Medical History  Diagnosis Date  . HLD (hyperlipidemia)   . Restless leg syndrome     well controlled on mirapex  . OSA on CPAP     sleep study 01/2014 - severe, CPAP at 10, previously on nuvigil (Dr. [name])  . Type 2 diabetes, controlled, with neuropathy [facility]) 2011    [name] in [place]  . H/O hiatal hernia   . GERD (gastroesophageal reflux disease)     with sliding HH  . Migraines 03/11/12    now rare  . CVA (cerebral infarction) 03/07/12    slurred speech; right hemplegia, left handed - completed PT 07/2014 (17 sessions)  . Basal cell carcinoma of nose 2002    bridge of nose  . History of kidney problems 12/2010    lacerated left kidney after fall  . BPH with obstruction/lower urinary tract symptoms     failed flomax, uroxatral, myrbetriq - sees urology [name]) pending videourodynamics  . Depression     mild, on cymbalta per prior PCP  . Palpitations 01/2012    holter WNL, rare PAC/PVCs  . Erectile dysfunction     per urology (prostaglandin injection)  . Kidney cysts 02/2014    bilateral (renal US)  . CKD (chronic kidney disease) stage 3, GFR 30-59 ml/min 03/11/2012    Renal US 02/2014 - multiple  kidney cysts   . TIA (transient ischemic attack)   . Anemia   . Presbycusis of both ears 2016    rec amplification [facility])    Past Surgical History  Procedure Laterality Date  . Prostate surgery  02/2009    PVP (laser)  . Tonsillectomy and adenoidectomy  ~ 1945  . Hemorrhoid surgery  1991  . Cataract extraction w/ intraocular lens implant  11//2012 - 08/2011  . Inguinal hernia repair  1981; 2002    left, then repaired  . Skin cancer excision  2002    bridge of nose, BCC  . Mri brain  04/2010    WNL  . Esophagogastroduodenoscopy  2006    duodenitis, medual HH, Grade 2 reflux, mild gastritis  . Colonoscopy  03/2011    poor prep, unable to biopsy polyp in tic fold, rpt 3 mo [name])  . Abi  2007    WNL  . Urethral dilation  2014    tannenbaum  . Cystoscopy with urethral dilatation N/A 07/24/2013    Procedure: CYSTOSCOPY WITH URETHRAL DILATATION;  Surgeon: [name], MD;  Location: [facility];  Service: Urology;  Laterality: N/A;  . Transurethral incision of prostate N/A 07/24/2013    Procedure: TRANSURETHRAL INCISION OF THE PROSTATE (TUIP);  Surgeon: [name], MD;  Location: [facility];  Service: Urology;  Laterality: N/A;  . Esophagogastroduodenoscopy  07/2011    mild chronic gastritis  . Colonoscopy  06/2011    mult polyps adenomatous, severe diverticulosis, rpt 3 yrs [name])  . Carotid endarterectomy    . Cardiac catheterization  2004    normal; [facility]  . Cardiovascular stress test  02/2014    WNL, low risk scan, EF 68%    There were no vitals filed for this visit.  Visit Diagnosis:  Hemiplegia affecting right side in right-dominant patient as late effect of cerebrovascular disease ([place])  Lack of coordination due to stroke  Abnormality of gait  Hypertonia  Weakness due to cerebrovascular accident  Decreased coordination  Unsteadiness  Balance problems      Subjective Assessment - 10/20/15 0939    Subjective This 82yo male had CVA  03/07/2012. He has had increased incident of falls recently. On office visit 10/05/2015 with Dr. [name], he was noted to have increased gait disturbance post-stroke and MD felt PT was indicated to improve moblity & safety with less fall risk.    Patient Stated Goals He wants to work on his balance & strength including core & RLE   Currently in Pain? No/denies            [facility] PT Assessment - 10/20/15 0930    Assessment   Medical Diagnosis Gait Disturbance post-storke   Referring Provider [name], MD   Onset Date/Surgical Date 10/05/15  MD visit   Hand Dominance Right   Precautions   Precautions Fall   Restrictions   Weight Bearing Restrictions No   Balance Screen   Has the patient fallen in the past 6 months Yes   How many times? 3  in dark backwards, standing up RLE gave out, uneven, no inju   Has the patient had a decrease in activity level because of a fear of falling?  No   Is the patient reluctant to leave their home because of a fear of falling?  No   Home Environment   Living Environment Private residence   Living Arrangements Alone   Type of [place] to enter   Entrance Stairs-Number of Steps 4" threshold   Entrance Stairs-Rails None   Home Layout One level   [place] - quad;Cane - single point;Shower seat   Prior Function   Level of Independence Independent;Independent with household mobility without device;Independent with community mobility with device   Vocation Retired   Leisure garden   Observation/Other Assessments   Focus on Therapeutic Outcomes (FOTO)  NP, patient did not complete   Coordination   Heel Shin Test slowed, but able to control motion   Posture/Postural Control   Posture/Postural Control Postural limitations   Postural Limitations Rounded Shoulders;Forward head;Increased lumbar lordosis;Flexed trunk;Weight shift left   ROM / Strength   AROM / PROM / Strength AROM;Strength   AROM   Overall  AROM  Within functional limits for tasks performed   Overall AROM Comments Tightness noted in hamstrings, hip flexors and ankle plantarflexion Right > Left   Strength   Overall Strength Deficits   Strength Assessment Site Hip;Knee;Ankle   Right/Left Hip Right;Left   Right Hip Flexion 3/5   Right Hip Extension 2+/5   Right Hip ABduction 3-/5   Left Hip Flexion 4/5   Left Hip Extension 3+/5   Left Hip ABduction 4-/5   Transfers   Transfers Sit to Stand;Stand to [employer] Transfers  Sit to Stand 5: Supervision;With upper extremity assist;With armrests;From chair/3-in-1   Stand to Sit 5: Supervision;With upper extremity assist;With armrests;To chair/3-in-1   Stand Pivot Transfers 5: Supervision;With armrests   Ambulation/Gait   Ambulation/Gait Yes   Ambulation/Gait Assistance 5: Supervision   Ambulation Distance (Feet) 100 Feet   Assistive device None;Small based quad cane;4-wheeled walker  AFO, pt reports no device in home, [facility] community, RW longer   Gait Pattern Step-through pattern;Decreased arm swing - right;Decreased step length - right;Decreased stance time - right;Decreased stride length;Decreased hip/knee flexion - right;Right hip hike;Right foot flat;Right flexed knee in stance;Ataxic;Narrow base of support;Trunk rotated posteriorly on right;Decreased trunk rotation   Stairs Yes   Stairs Assistance 5: Supervision   Stair Management Technique One rail Left;Step to pattern;Forwards   Number of Stairs 4   Standardized Balance Assessment   Standardized Balance Assessment Timed Up and Go Test;Berg Balance Test   Berg Balance Test   Sit to Stand Able to stand  independently using hands   Standing Unsupported Able to stand safely 2 minutes   Sitting with Back Unsupported but Feet Supported on Floor or Stool Able to sit safely and securely 2 minutes   Stand to Sit Controls descent by using hands   Transfers Able to transfer safely, definite need of hands   Standing  Unsupported with Eyes Closed Able to stand 10 seconds with supervision   Standing Ubsupported with Feet Together Able to place feet together independently but unable to hold for 30 seconds   From Standing, Reach Forward with Outstretched Arm Can reach forward >5 cm safely (2")   From Standing Position, Pick up Object from Floor Able to pick up shoe, needs supervision   From Standing Position, Turn to Look Behind Over each Shoulder Turn sideways only but maintains balance   Turn 360 Degrees Needs close supervision or verbal cueing   Standing Unsupported, Alternately Place Feet on Step/Stool Able to complete >2 steps/needs minimal assist   Standing Unsupported, One Foot in Front Needs help to step but can hold 15 seconds   Standing on One Leg Tries to lift leg/unable to hold 3 seconds but remains standing independently   Total Score 33   Timed Up and Go Test   Normal TUG (seconds) 18.93  18.93sec no device, 16.90sec SBQC   Cognitive TUG (seconds) 17.95  no device, indicates fall risk, no signficant from std TUG   Functional Gait  Assessment   Gait assessed  Yes   Gait Level Surface Walks 20 ft, slow speed, abnormal gait pattern, evidence for imbalance or deviates 10-15 in outside of the 12 in walkway width. Requires more than 7 sec to ambulate 20 ft.   Change in Gait Speed Cannot change speeds, deviates greater than 15 in outside 12 in walkway width, or loses balance and has to reach for wall or be caught.   Gait with Horizontal Head Turns Performs head turns with moderate changes in gait velocity, slows down, deviates 10-15 in outside 12 in walkway width but recovers, can continue to walk.   Gait with Vertical Head Turns Performs task with moderate change in gait velocity, slows down, deviates 10-15 in outside 12 in walkway width but recovers, can continue to walk.   Gait and Pivot Turn Turns slowly, requires verbal cueing, or requires several small steps to catch balance following turn and stop    Step Over Obstacle Cannot perform without assistance.   Gait with Narrow Base of Support Ambulates less than 4  steps heel to toe or cannot perform without assistance.   Gait with Eyes Closed Cannot walk 20 ft without assistance, severe gait deviations or imbalance, deviates greater than 15 in outside 12 in walkway width or will not attempt task.   Ambulating Backwards Walks 20 ft, slow speed, abnormal gait pattern, evidence for imbalance, deviates 10-15 in outside 12 in walkway width.   Steps Two feet to a stair, must use rail.   Total Score 6                             PT Short Term Goals - 10/20/15 1015    PT SHORT TERM GOAL #1   Title Patient demonstrates understanding of updated HEP.  (Target Date: 11/19/2015)   Time 4   Period Weeks   Status New   PT SHORT TERM GOAL #2   Title Berg Balance >/= 38/56  (Target Date: 11/19/2015)   Time 4   Period Weeks   Status New   PT SHORT TERM GOAL #3   Title Tiimed Up & Go without device <17sec with supervision.  (Target Date: 11/19/2015)   Time 4   Period Weeks   Status New   PT SHORT TERM GOAL #4   Title Patient ambulates around obstacles with SBQC 300' with supervision.  (Target Date: 11/19/2015)   Time 4   Period Weeks   Status New   PT SHORT TERM GOAL #5   Title Patient negotiates ramps & curbs with [facility] with supervision.  (Target Date: 11/19/2015)   Time 4   Period Weeks   Status New           PT Long Term Goals - 10/20/15 1015    PT LONG TERM GOAL #1   Title Patient verbalizes & demonstrates understanding of updated, ongoing fitness program / HEP. (Target Date: 12/17/2015)   Time 8   Period Weeks   Status New   PT LONG TERM GOAL #2   Title Berg Balance >45/56 to indicate lower fall risk.  (Target Date: 12/17/2015)   Time 8   Period Weeks   Status New   PT LONG TERM GOAL #3   Title Timed Up & Go without device <15 sec safely to indicate lower fall risk.  (Target Date: 12/17/2015)   Time 8   Period Weeks    Status New   PT LONG TERM GOAL #4   Title Patient able to scan environment while ambulating without fall risk.  (Target Date: 12/17/2015)   Time 8   Period Weeks   Status New   PT LONG TERM GOAL #5   Title Gait Velocity increases from 2.03 ft/sec to >2.2 ft/sec  (Target Date: 12/17/2015)   Time 8   Period Weeks   Status New               Plan - 10/21/15 1213    Clinical Impression Statement This 82yo male lives alone with significant PMH limiting mobility. He is having falls with potential for serious injury. Patient has weakness in LEs Rt> Lt. Berg Balance 33/56, Functional Gait Assessment 6/30 and Timed Up-Go of 18.93sec all indicate high risk of falls. Patient's gait has deviations with greatest issues with swing control negatively impacting placement of foot for weight acceptance. Patient would benefit from skilled care to improve his mobility & decrease his fall risk.    Pt will benefit from skilled therapeutic intervention in order to improve on  the following deficits Abnormal gait;Decreased activity tolerance;Decreased balance;Decreased knowledge of use of DME;Decreased mobility;Decreased strength;Impaired flexibility   Rehab Potential Good   PT Frequency 2x / week   PT Duration 8 weeks   PT Treatment/Interventions ADLs/Self Care Home Management;DME Instruction;Gait training;Stair training;Functional mobility training;Therapeutic activities;Therapeutic exercise;Balance training;Neuromuscular re-education;Patient/family education;Orthotic Fit/Training   PT Next Visit Plan Review current HEP and update.    Consulted and Agree with Plan of Care Patient          G-Codes - 17-Nov-2015 1015    Functional Assessment Tool Used [name] Balance 33/56; Timed Up-Go 18.93sec without device   Functional Limitation Mobility: Walking and moving around   Mobility: Walking and Moving Around Current Status [phone]) At least 60 percent but less than 80 percent impaired, limited or restricted    Mobility: Walking and Moving Around Goal Status [phone]) At least 40 percent but less than 60 percent impaired, limited or restricted       Problem List Patient Active Problem List   Diagnosis Date Noted  . Gait disturbance, post-stroke 10/05/2015  . Presbycusis of both ears   . External nasal lesion 06/23/2015  . Advanced care planning/counseling discussion 06/23/2015  . Skin rash 06/23/2015  . Impaired mobility 04/23/2015  . HTN (hypertension) 03/18/2015  . Chronic diastolic heart failure, NYHA class 1 ([place]) 03/18/2015  . Spastic hemiparesis affecting dominant side ([place]) 07/07/2014  . Left shoulder pain 03/16/2014  . Chest pressure 02/03/2014  . Medicare annual wellness visit, subsequent 11/06/2013  . Erectile dysfunction due to arterial insufficiency 02/05/2013  . Depression   . Palpitations 06/26/2012  . HLD (hyperlipidemia)   . Restless leg syndrome   . GERD (gastroesophageal reflux disease)   . BPH with obstruction/lower urinary tract symptoms   . History of CVA (cerebrovascular accident) 03/11/2012  . Hemiparesis affecting right side as late effect of stroke ([place]) 03/11/2012  . Type 2 diabetes, controlled, with neuropathy ([place]) 03/11/2012  . OSA (obstructive sleep apnea) 03/11/2012  . CKD (chronic kidney disease) stage 3, GFR 30-59 ml/min 03/11/2012  . Migraines 03/11/2012    Teiana Hajduk PT, DPT 10/21/2015, 12:32 PM  [facility] [address] [place], [name], [address] Phone: [phone]   Fax:  [phone]  Name: James Bell MRN: [id number] Date of Birth: [birth year]

## 2015-10-25 ENCOUNTER — Encounter: Payer: Self-pay | Admitting: Family Medicine

## 2015-10-25 ENCOUNTER — Ambulatory Visit: Payer: Medicare Other | Admitting: Physical Therapy

## 2015-10-25 ENCOUNTER — Encounter: Payer: Self-pay | Admitting: Physical Therapy

## 2015-10-25 ENCOUNTER — Ambulatory Visit (INDEPENDENT_AMBULATORY_CARE_PROVIDER_SITE_OTHER): Payer: Medicare Other | Admitting: Family Medicine

## 2015-10-25 VITALS — BP 138/70 | HR 68 | Temp 97.9°F | Wt 202.8 lb

## 2015-10-25 DIAGNOSIS — G2581 Restless legs syndrome: Secondary | ICD-10-CM

## 2015-10-25 DIAGNOSIS — R269 Unspecified abnormalities of gait and mobility: Secondary | ICD-10-CM

## 2015-10-25 DIAGNOSIS — R05 Cough: Secondary | ICD-10-CM | POA: Diagnosis not present

## 2015-10-25 DIAGNOSIS — I10 Essential (primary) hypertension: Secondary | ICD-10-CM

## 2015-10-25 DIAGNOSIS — M6289 Other specified disorders of muscle: Secondary | ICD-10-CM

## 2015-10-25 DIAGNOSIS — I69898 Other sequelae of other cerebrovascular disease: Secondary | ICD-10-CM | POA: Diagnosis not present

## 2015-10-25 DIAGNOSIS — L989 Disorder of the skin and subcutaneous tissue, unspecified: Secondary | ICD-10-CM | POA: Diagnosis not present

## 2015-10-25 DIAGNOSIS — R278 Other lack of coordination: Secondary | ICD-10-CM

## 2015-10-25 DIAGNOSIS — K219 Gastro-esophageal reflux disease without esophagitis: Secondary | ICD-10-CM | POA: Diagnosis not present

## 2015-10-25 DIAGNOSIS — I69398 Other sequelae of cerebral infarction: Secondary | ICD-10-CM

## 2015-10-25 DIAGNOSIS — I69951 Hemiplegia and hemiparesis following unspecified cerebrovascular disease affecting right dominant side: Secondary | ICD-10-CM

## 2015-10-25 DIAGNOSIS — I69351 Hemiplegia and hemiparesis following cerebral infarction affecting right dominant side: Secondary | ICD-10-CM

## 2015-10-25 DIAGNOSIS — R2689 Other abnormalities of gait and mobility: Secondary | ICD-10-CM

## 2015-10-25 DIAGNOSIS — R279 Unspecified lack of coordination: Secondary | ICD-10-CM | POA: Diagnosis not present

## 2015-10-25 DIAGNOSIS — M6249 Contracture of muscle, multiple sites: Secondary | ICD-10-CM | POA: Diagnosis not present

## 2015-10-25 DIAGNOSIS — R058 Other specified cough: Secondary | ICD-10-CM | POA: Insufficient documentation

## 2015-10-25 DIAGNOSIS — I6980 Unspecified sequelae of other cerebrovascular disease: Secondary | ICD-10-CM | POA: Diagnosis not present

## 2015-10-25 DIAGNOSIS — IMO0002 Reserved for concepts with insufficient information to code with codable children: Secondary | ICD-10-CM

## 2015-10-25 DIAGNOSIS — R2681 Unsteadiness on feet: Secondary | ICD-10-CM

## 2015-10-25 DIAGNOSIS — M6281 Muscle weakness (generalized): Secondary | ICD-10-CM

## 2015-10-25 DIAGNOSIS — R531 Weakness: Secondary | ICD-10-CM

## 2015-10-25 NOTE — Assessment & Plan Note (Signed)
Continue PPI. Add zantac nightly.

## 2015-10-25 NOTE — Progress Notes (Signed)
BP 138/70 mmHg  Pulse 68  Temp(Src) 97.9 F (36.6 C) (Oral)  Wt 202 lb 12 oz (91.967 kg)   CC: 5mo follow up visit  Subjective:    Patient ID: James Bell, male    DOB: Jun 06, 1931, 82 y.o.   MRN: [id number]  HPI: James Bell is a 82 y.o. male presenting on 10/25/2015 for Follow-up   Weight gain noted. On tresiba 60u daily for diabetes. Followed by [name].   Mild dry cough present for last 3 weeks. Present throughout the day. Lingering. No congestion, not productive cough, fevers, ST.  Nasal lesion that is returning. H/o basal cell removed from nose, worried it may be returning. Requests derm referral.   RLS - last visit we increased mirapex to 2mg  nightly. Doing better with this.   Continues quad cane use. Has had a few falls in the past few months. No injury. Has been set up to return to rehab at [facility] and balance training. No dizziness or syncope or premonitory symptoms.   HTN - chronic, stable. HLD - continues lipitor 40mg  daily and fish oil 2gm bid. Burned left hand when getting pan out of oven.   Relevant past medical, surgical, family and social history reviewed and updated as indicated. Interim medical history since our last visit reviewed. Allergies and medications reviewed and updated. Current Outpatient Prescriptions on File Prior to Visit  Medication Sig  . atorvastatin (LIPITOR) 40 MG tablet TAKE 1 TABLET DAILY  . clopidogrel (PLAVIX) 75 MG tablet TAKE 1 TABLET DAILY  . Coenzyme Q10 (COQ10) 150 MG CAPS Take 1 capsule by mouth 2 (two) times daily.  . DULoxetine (CYMBALTA) 60 MG capsule Take 1 capsule (60 mg total) by mouth daily.  . Folic Acid-Vit Q000111Q [id number] (FOLBEE) 2.5-25-1 MG TABS tablet Take 1 tablet by mouth daily.  Marland Kitchen ibuprofen (ADVIL,MOTRIN) 200 MG tablet Take 400 mg by mouth every 6 (six) hours as needed for mild pain or moderate pain.  . Insulin Degludec (TRESIBA FLEXTOUCH) 200 UNIT/ML SOPN Inject 80 Units into the skin at bedtime.   . Insulin  Pen Needle (NOVOFINE) 30G X 8 MM MISC Use to inject insulin once daily dx:E11.40  . levothyroxine (LEVOTHROID) 25 MCG tablet Take 1 tablet (25 mcg total) by mouth daily before breakfast. Per endo [name]  . losartan (COZAAR) 25 MG tablet Take 1 tablet (25 mg total) by mouth daily.  . metoprolol tartrate (LOPRESSOR) 25 MG tablet Take 0.5 tablets (12.5 mg total) by mouth 2 (two) times daily.  Marland Kitchen NITROSTAT 0.4 MG SL tablet DISSOLVE 1 TABLET UNDER THE TONGUE EVERY 5 MINUTES AS NEEDED FOR CHEST PAIN  . omega-3 acid ethyl esters (LOVAZA) 1 G capsule TAKE 2 CAPSULES TWICE A DAY  . omeprazole (PRILOSEC) 40 MG capsule Take 1 capsule (40 mg total) by mouth daily.  . pramipexole (MIRAPEX) 1 MG tablet Take 2 tablets (2 mg total) by mouth at bedtime.  . traZODone (DESYREL) 50 MG tablet Take 0.5 tablets (25 mg total) by mouth at bedtime as needed.  . vitamin C (ASCORBIC ACID) 500 MG tablet Take 1,000 mg by mouth daily.   . diazepam (VALIUM) 5 MG tablet Take 1 tablet (5 mg total) by mouth every 8 (eight) hours as needed for muscle spasms. (Patient not taking: Reported on 10/25/2015)   No current facility-administered medications on file prior to visit.    Review of Systems Per HPI unless specifically indicated in ROS section     Objective:  BP 138/70 mmHg  Pulse 68  Temp(Src) 97.9 F (36.6 C) (Oral)  Wt 202 lb 12 oz (91.967 kg)  Wt Readings from Last 3 Encounters:  10/25/15 202 lb 12 oz (91.967 kg)  07/22/15 186 lb (84.369 kg)  06/23/15 183 lb 12 oz (83.348 kg)    Physical Exam  Constitutional: He appears well-developed and well-nourished. No distress.  HENT:  Mouth/Throat: Oropharynx is clear and moist. No oropharyngeal exudate.  Eyes: Conjunctivae and EOM are normal. Pupils are equal, round, and reactive to light.  Cardiovascular: Normal rate, regular rhythm, normal heart sounds and intact distal pulses.   No murmur heard. Pulmonary/Chest: Effort normal and breath sounds normal. No  respiratory distress. He has no wheezes. He has no rales.  Bibasilar crackles that do clear with deep cough  Abdominal: Soft. Bowel sounds are normal. There is no tenderness.  Musculoskeletal: He exhibits no edema.  Skin:  scab 2x3cm without surrounding erythema dorsal L hand  Psychiatric: He has a normal mood and affect.  Nursing note and vitals reviewed.  Results for orders placed or performed in visit on 06/23/15  Renal function panel  Result Value Ref Range   Sodium 140 135 - 145 mEq/L   Potassium 3.7 3.5 - 5.1 mEq/L   Chloride 105 96 - 112 mEq/L   CO2 26 19 - 32 mEq/L   Calcium 9.5 8.4 - 10.5 mg/dL   Albumin 3.9 3.5 - 5.2 g/dL   BUN 24 (H) 6 - 23 mg/dL   Creatinine, Ser 1.38 0.40 - 1.50 mg/dL   Glucose, Bld 101 (H) 70 - 99 mg/dL   Phosphorus 4.6 2.3 - 4.6 mg/dL   GFR 52.43 (L) >60.00 mL/min  Hemoglobin A1c  Result Value Ref Range   Hgb A1c MFr Bld 7.3 (H) 4.6 - 6.5 %  Vit D  25 hydroxy (rtn osteoporosis monitoring)  Result Value Ref Range   VITD 50.43 30.00 - 100.00 ng/mL  LDL Cholesterol, Direct  Result Value Ref Range   Direct LDL 64.0 mg/dL      Assessment & Plan:   Problem List Items Addressed This Visit    Restless leg syndrome    Stable on higher mirapex dose (2mg  nightly)      HTN (hypertension)    bp well controlled today.      Hemiparesis affecting right side as late effect of stroke (HCC)    Stable. Pending PT rehab for recurrent falls.      GERD (gastroesophageal reflux disease)    Continue PPI. Add zantac nightly.       Gait disturbance, post-stroke    Endorses increasing weakness with falls. Has already set up PT rehab through neuro.       RESOLVED: External nasal lesion    Evaluated by derm without concern      Dry cough - Primary    Ongoing for 3 weeks, no signs of bacterial infection. No significant allergic rhinitis symptoms. ?GERD - rec continue PPI daily and add zantac QHS.          Follow up plan: Return in about 4 months  (around 02/22/2016), or as needed, for follow up visit.

## 2015-10-25 NOTE — Patient Instructions (Addendum)
Buy some zantac to take nightly to see if cough resolves with this.  Good to see you today, call us with questions. I'm glad we will be attending PT rehab for fall prevention, let me know if persistent falls despite rehab.  Return in 4 months for follow up.

## 2015-10-25 NOTE — Patient Instructions (Signed)
Fitness Plan has 4 components.  1. Endurance - Recommend machines that are sitting with back support that uses both your arms and your legs.Or can do walking program Goal is 20-30 minutes. 2. Strength - weight machines enough weight to have resistance but not so much that you have to strain or cheat.  Goal is to do 15 repetitions for 2-3 sets. Rest 30-60 seconds between sets.  Do 2-4 leg machines, 2-4 arm machines & 2-4 trunk machines. Look for pictures to make sure you exercise both sides of arm, leg or trunk.  Leg machines like leg press (can do both legs or each leg by themselves),   At home can do theraband exercises for arms or legs, floor transfers, sit to stand to sit using arms as little as possible, Yoga positions 3.Flexibility - make sure you include arms, legs & trunk. Can do Yoga also 4. Balance- can work in corner with chair in front - head turns, arm motions, eyes closed; place one foot inside cabinet or on 4-6" block to work on one-legged stance,   Try to get to each component 3-5 times per week.  Going to [facility] or fitness center you want do things you can not do at home.  For example use bikes at [facility] if you don't have one at home. Then on days you don't go to [facility], do exercises at home.  Chair Sitting    Sit at edge of seat, spine straight, one leg extended. Put a hand on each thigh and bend forward from the hip, keeping spine straight. Allow hand on extended leg to reach toward toes. Support upper body with other arm. Hold _30__ seconds. Repeat _2__ times on each leg per session. Do __1-2_ sessions per day.  Copyright  VHI. All rights reserved.  Gastroc / Heel Cord Stretch - Seated With Towel    Sit with one leg straight like exercise above, use your cane upside down or towel around ball of foot. Gently pull foot in toward body, stretching heel cord and calf. Hold for _30__ seconds. Repeat on involved leg. Repeat _2__ times on each leg. Do _1-2__ times per  day.  Copyright  VHI. All rights reserved.  Leg Press (Machine)    Press forward until knees are just short of locked position then slowly fully straighten knees under control.  Do __1-2__ sets. Complete __15 __ repetitions with both legs, 15 times with right leg, 15 times with left leg.   http://st.exer.us/336   Copyright  VHI. All rights reserved.

## 2015-10-25 NOTE — Progress Notes (Signed)
Pre visit review using our clinic review tool, if applicable. No additional management support is needed unless otherwise documented below in the visit note. 

## 2015-10-25 NOTE — Assessment & Plan Note (Addendum)
Stable. Pending PT rehab for recurrent falls.

## 2015-10-25 NOTE — Assessment & Plan Note (Signed)
Ongoing for 3 weeks, no signs of bacterial infection. No significant allergic rhinitis symptoms. ?GERD - rec continue PPI daily and add zantac QHS.

## 2015-10-25 NOTE — Assessment & Plan Note (Signed)
bp well controlled today.  

## 2015-10-25 NOTE — Therapy (Signed)
[facility] [address] [place], [name], [address] Phone: [phone]   Fax:  [phone]  Physical Therapy Treatment  Patient Details  Name: James Bell MRN: [id number] Date of Birth: December 04, 1930 Referring Provider: [name], MD  Encounter Date: 10/25/2015      PT End of Session - 10/25/15 1325    Visit Number 2   Number of Visits 17   Date for PT Re-Evaluation 12/17/15   Authorization Type Medicare G-Code & Progress Note every 10 visits   PT Start Time [id number]   PT Stop Time 1320   PT Time Calculation (min) 45 min   Equipment Utilized During Treatment Gait belt   Activity Tolerance Patient tolerated treatment well   Behavior During Therapy [facility] for tasks assessed/performed      Past Medical History  Diagnosis Date  . HLD (hyperlipidemia)   . Restless leg syndrome     well controlled on mirapex  . OSA on CPAP     sleep study 01/2014 - severe, CPAP at 10, previously on nuvigil (Dr. [name])  . Type 2 diabetes, controlled, with neuropathy [facility]) 2011    [name] in [place]  . H/O hiatal hernia   . GERD (gastroesophageal reflux disease)     with sliding HH  . Migraines 03/11/12    now rare  . CVA (cerebral infarction) 03/07/12    slurred speech; right hemplegia, left handed - completed PT 07/2014 (17 sessions)  . Basal cell carcinoma of nose 2002    bridge of nose  . History of kidney problems 12/2010    lacerated left kidney after fall  . BPH with obstruction/lower urinary tract symptoms     failed flomax, uroxatral, myrbetriq - sees urology [name]) pending videourodynamics  . Depression     mild, on cymbalta per prior PCP  . Palpitations 01/2012    holter WNL, rare PAC/PVCs  . Erectile dysfunction     per urology (prostaglandin injection)  . Kidney cysts 02/2014    bilateral (renal US)  . CKD (chronic kidney disease) stage 3, GFR 30-59 ml/min 03/11/2012    Renal US 02/2014 - multiple kidney  cysts   . TIA (transient ischemic attack)   . Anemia   . Presbycusis of both ears 2016    rec amplification [facility])    Past Surgical History  Procedure Laterality Date  . Prostate surgery  02/2009    PVP (laser)  . Tonsillectomy and adenoidectomy  ~ 1945  . Hemorrhoid surgery  1991  . Cataract extraction w/ intraocular lens implant  11//2012 - 08/2011  . Inguinal hernia repair  1981; 2002    left, then repaired  . Skin cancer excision  2002    bridge of nose, BCC  . Mri brain  04/2010    WNL  . Esophagogastroduodenoscopy  2006    duodenitis, medual HH, Grade 2 reflux, mild gastritis  . Colonoscopy  03/2011    poor prep, unable to biopsy polyp in tic fold, rpt 3 mo [name])  . Abi  2007    WNL  . Urethral dilation  2014    tannenbaum  . Cystoscopy with urethral dilatation N/A 07/24/2013    Procedure: CYSTOSCOPY WITH URETHRAL DILATATION;  Surgeon: [name], MD;  Location: [facility] ORS;  Service: Urology;  Laterality: N/A;  . Transurethral incision of prostate N/A 07/24/2013    Procedure: TRANSURETHRAL INCISION OF THE PROSTATE (TUIP);  Surgeon: [name], MD;  Location: [facility];  Service: Urology;  Laterality: N/A;  . Esophagogastroduodenoscopy  07/2011    mild chronic gastritis  . Colonoscopy  06/2011    mult polyps adenomatous, severe diverticulosis, rpt 3 yrs [name])  . Carotid endarterectomy    . Cardiac catheterization  2004    normal; [place]  . Cardiovascular stress test  02/2014    WNL, low risk scan, EF 68%    There were no vitals filed for this visit.  Visit Diagnosis:  Hemiplegia affecting right side in right-dominant patient as late effect of cerebrovascular disease ([place])  Lack of coordination due to stroke  Abnormality of gait  Hypertonia  Weakness due to cerebrovascular accident  Decreased coordination  Unsteadiness  Balance problems  Muscle weakness  Generalized weakness      Subjective Assessment - 10/25/15 1238     Subjective No falls or issues since evaluation.    Currently in Pain? No/denies      Current HEP: He has a NuStep in his home: 10-15 minutes on level 2 using his LEs & LUE. He tries to use RUE some.   Exercise Room: He only uses when someone (another resident) is present. Treadmill: 5 minutes not sure about speed.  Leg press 1 notch (~20#) 15 reps.  Chest butterfly lowest level 10-15 reps  No stretches or no balance exercises.    PT instructed in proper use & set-up of Leg press: Patient performed BLE with 30# & 40# as too low of weight, 90# as too heavy, 70# correct with controlled full motion 15 reps; RLE 40# 15 reps; LLE 50# 15 reps.  PT instructed in proper set-up & resistance level for NuStep. Patient performed level 5 with 4 extremities for 6 minutes while PT finalized HEP instructions and cueing pt on NuStep full ROM.                           PT Education - 10/25/15 1343    Education provided Yes   Education Details Hamstring & heelcord stretch, Leg press & NuStep set-up & use. Overview of well-rounded fitness program components.    Person(s) Educated Patient   Methods Explanation;Demonstration;Tactile cues;Verbal cues;Handout   Comprehension Verbalized understanding;Returned demonstration;Verbal cues required;Tactile cues required;Need further instruction          PT Short Term Goals - 10/20/15 1015    PT SHORT TERM GOAL #1   Title Patient demonstrates understanding of updated HEP.  (Target Date: 11/19/2015)   Time 4   Period Weeks   Status New   PT SHORT TERM GOAL #2   Title Berg Balance >/= 38/56  (Target Date: 11/19/2015)   Time 4   Period Weeks   Status New   PT SHORT TERM GOAL #3   Title Tiimed Up & Go without device <17sec with supervision.  (Target Date: 11/19/2015)   Time 4   Period Weeks   Status New   PT SHORT TERM GOAL #4   Title Patient ambulates around obstacles with SBQC 300' with supervision.  (Target Date: 11/19/2015)   Time 4    Period Weeks   Status New   PT SHORT TERM GOAL #5   Title Patient negotiates ramps & curbs with [facility] with supervision.  (Target Date: 11/19/2015)   Time 4   Period Weeks   Status New           PT Long Term Goals - 10/20/15 1015    PT LONG TERM GOAL #  1   Title Patient verbalizes & demonstrates understanding of updated, ongoing fitness program / HEP. (Target Date: 12/17/2015)   Time 8   Period Weeks   Status New   PT LONG TERM GOAL #2   Title Berg Balance >45/56 to indicate lower fall risk.  (Target Date: 12/17/2015)   Time 8   Period Weeks   Status New   PT LONG TERM GOAL #3   Title Timed Up & Go without device <15 sec safely to indicate lower fall risk.  (Target Date: 12/17/2015)   Time 8   Period Weeks   Status New   PT LONG TERM GOAL #4   Title Patient able to scan environment while ambulating without fall risk.  (Target Date: 12/17/2015)   Time 8   Period Weeks   Status New   PT LONG TERM GOAL #5   Title Gait Velocity increases from 2.03 ft/sec to >2.2 ft/sec  (Target Date: 12/17/2015)   Time 8   Period Weeks   Status New               Plan - 10/25/15 1326    Clinical Impression Statement Patient reports a better understanding of leg press and choosing amount of weight also doing BLEs, RLE & LLE. Patient understands proper set-up of NuStep per report.    Pt will benefit from skilled therapeutic intervention in order to improve on the following deficits Abnormal gait;Decreased activity tolerance;Decreased balance;Decreased knowledge of use of DME;Decreased mobility;Decreased strength;Impaired flexibility   Rehab Potential Good   PT Frequency 2x / week   PT Duration 8 weeks   PT Treatment/Interventions ADLs/Self Care Home Management;DME Instruction;Gait training;Stair training;Functional mobility training;Therapeutic activities;Therapeutic exercise;Balance training;Neuromuscular re-education;Patient/family education;Orthotic Fit/Training   PT Next Visit Plan Check  what machines available at his retirement community (pt to take pictures)   Consulted and Agree with Plan of Care Patient        Problem List Patient Active Problem List   Diagnosis Date Noted  . Dry cough 10/25/2015  . Gait disturbance, post-stroke 10/05/2015  . Presbycusis of both ears   . Advanced care planning/counseling discussion 06/23/2015  . Skin rash 06/23/2015  . Impaired mobility 04/23/2015  . HTN (hypertension) 03/18/2015  . Chronic diastolic heart failure, NYHA class 1 ([place]) 03/18/2015  . Spastic hemiparesis affecting dominant side ([place]) 07/07/2014  . Left shoulder pain 03/16/2014  . Chest pressure 02/03/2014  . Medicare annual wellness visit, subsequent 11/06/2013  . Erectile dysfunction due to arterial insufficiency 02/05/2013  . Depression   . Palpitations 06/26/2012  . HLD (hyperlipidemia)   . Restless leg syndrome   . GERD (gastroesophageal reflux disease)   . BPH with obstruction/lower urinary tract symptoms   . History of CVA (cerebrovascular accident) 03/11/2012  . Hemiparesis affecting right side as late effect of stroke ([place]) 03/11/2012  . Type 2 diabetes, controlled, with neuropathy ([place]) 03/11/2012  . OSA (obstructive sleep apnea) 03/11/2012  . CKD (chronic kidney disease) stage 3, GFR 30-59 ml/min 03/11/2012  . Migraines 03/11/2012    Elvie Palomo PT, DPT 10/25/2015, 1:44 PM  [facility] [address] [place], [name], [address] Phone: [phone]   Fax:  [phone]  Name: James Bell MRN: [id number] Date of Birth: Oct 13, 1930

## 2015-10-25 NOTE — Assessment & Plan Note (Addendum)
Stable on higher mirapex dose (2mg  nightly)

## 2015-10-25 NOTE — Assessment & Plan Note (Signed)
Evaluated by derm without concern

## 2015-10-25 NOTE — Assessment & Plan Note (Signed)
Endorses increasing weakness with falls. Has already set up PT rehab through neuro.

## 2015-10-28 ENCOUNTER — Encounter: Payer: Self-pay | Admitting: Physical Therapy

## 2015-10-28 ENCOUNTER — Ambulatory Visit: Payer: Medicare Other | Admitting: Physical Therapy

## 2015-10-28 DIAGNOSIS — M6289 Other specified disorders of muscle: Secondary | ICD-10-CM

## 2015-10-28 DIAGNOSIS — R2681 Unsteadiness on feet: Secondary | ICD-10-CM

## 2015-10-28 DIAGNOSIS — R279 Unspecified lack of coordination: Secondary | ICD-10-CM | POA: Diagnosis not present

## 2015-10-28 DIAGNOSIS — R269 Unspecified abnormalities of gait and mobility: Secondary | ICD-10-CM

## 2015-10-28 DIAGNOSIS — M6249 Contracture of muscle, multiple sites: Secondary | ICD-10-CM | POA: Diagnosis not present

## 2015-10-28 DIAGNOSIS — I69898 Other sequelae of other cerebrovascular disease: Secondary | ICD-10-CM | POA: Diagnosis not present

## 2015-10-28 DIAGNOSIS — I6980 Unspecified sequelae of other cerebrovascular disease: Secondary | ICD-10-CM | POA: Diagnosis not present

## 2015-10-28 DIAGNOSIS — I69951 Hemiplegia and hemiparesis following unspecified cerebrovascular disease affecting right dominant side: Secondary | ICD-10-CM

## 2015-10-28 DIAGNOSIS — IMO0002 Reserved for concepts with insufficient information to code with codable children: Secondary | ICD-10-CM

## 2015-10-28 DIAGNOSIS — R2689 Other abnormalities of gait and mobility: Secondary | ICD-10-CM

## 2015-10-28 NOTE — Therapy (Signed)
[facility] [address] [place], [name], [address] Phone: [phone]   Fax:  [phone]  Physical Therapy Treatment  Patient Details  Name: James Bell MRN: [id number] Date of Birth: 1931/08/24 Referring Provider: [name], MD  Encounter Date: 10/28/2015      PT End of Session - 10/28/15 1615    Visit Number 3   Number of Visits 17   Date for PT Re-Evaluation 12/17/15   Authorization Type Medicare G-Code & Progress Note every 10 visits   PT Start Time 1533   PT Stop Time 1615   PT Time Calculation (min) 42 min   Equipment Utilized During Treatment Gait belt   Activity Tolerance Patient tolerated treatment well   Behavior During Therapy [facility] for tasks assessed/performed      Past Medical History  Diagnosis Date  . HLD (hyperlipidemia)   . Restless leg syndrome     well controlled on mirapex  . OSA on CPAP     sleep study 01/2014 - severe, CPAP at 10, previously on nuvigil (Dr. [name])  . Type 2 diabetes, controlled, with neuropathy [facility]) 2011    [name] in [place]  . H/O hiatal hernia   . GERD (gastroesophageal reflux disease)     with sliding HH  . Migraines 03/11/12    now rare  . CVA (cerebral infarction) 03/07/12    slurred speech; right hemplegia, left handed - completed PT 07/2014 (17 sessions)  . Basal cell carcinoma of nose 2002    bridge of nose  . History of kidney problems 12/2010    lacerated left kidney after fall  . BPH with obstruction/lower urinary tract symptoms     failed flomax, uroxatral, myrbetriq - sees urology [name]) pending videourodynamics  . Depression     mild, on cymbalta per prior PCP  . Palpitations 01/2012    holter WNL, rare PAC/PVCs  . Erectile dysfunction     per urology (prostaglandin injection)  . Kidney cysts 02/2014    bilateral (renal US)  . CKD (chronic kidney disease) stage 3, GFR 30-59 ml/min 03/11/2012    Renal US 02/2014 - multiple kidney  cysts   . TIA (transient ischemic attack)   . Anemia   . Presbycusis of both ears 2016    rec amplification [facility])    Past Surgical History  Procedure Laterality Date  . Prostate surgery  02/2009    PVP (laser)  . Tonsillectomy and adenoidectomy  ~ 1945  . Hemorrhoid surgery  1991  . Cataract extraction w/ intraocular lens implant  11//2012 - 08/2011  . Inguinal hernia repair  1981; 2002    left, then repaired  . Skin cancer excision  2002    bridge of nose, BCC  . Mri brain  04/2010    WNL  . Esophagogastroduodenoscopy  2006    duodenitis, medual HH, Grade 2 reflux, mild gastritis  . Colonoscopy  03/2011    poor prep, unable to biopsy polyp in tic fold, rpt 3 mo [name])  . Abi  2007    WNL  . Urethral dilation  2014    tannenbaum  . Cystoscopy with urethral dilatation N/A 07/24/2013    Procedure: CYSTOSCOPY WITH URETHRAL DILATATION;  Surgeon: [name], MD;  Location: [facility];  Service: Urology;  Laterality: N/A;  . Transurethral incision of prostate N/A 07/24/2013    Procedure: TRANSURETHRAL INCISION OF THE PROSTATE (TUIP);  Surgeon: [name], MD;  Location: [facility];  Service: Urology;  Laterality: N/A;  . Esophagogastroduodenoscopy  07/2011    mild chronic gastritis  . Colonoscopy  06/2011    mult polyps adenomatous, severe diverticulosis, rpt 3 yrs [name])  . Carotid endarterectomy    . Cardiac catheterization  2004    normal; [facility]  . Cardiovascular stress test  02/2014    WNL, low risk scan, EF 68%    There were no vitals filed for this visit.  Visit Diagnosis:  Hemiplegia affecting right side in right-dominant patient as late effect of cerebrovascular disease ([place])  Lack of coordination due to stroke  Abnormality of gait  Hypertonia  Weakness due to cerebrovascular accident  Unsteadiness  Balance problems      Subjective Assessment - 10/28/15 1538    Subjective Stretches are going well.    Currently in Pain? No/denies       Equipment at center's exercise room: Rucumbent bicycle, chest press/ butterfly, elliptical standing, treadmill, lat pull & row together with knee extension seated / knee flexion standing, leg press PT recommended continuing with NuStep in his room over recumbent bike; use of butterfly /pectoralis machine as he reports use, PT demo/instructed in use of row machine with return demo (he has to perform reciprocally with LUE assisting grip /motion of RUE). Elliptical only standing on ground in front of machine for row motion. Continue with leg press as instructed last week.  NeuroMuscular Re-ed:  Use of color tiles for visual targets to increase step length & coordination of movement. Alternate stepping forward. Progressed to walking along counter with proper step length. Then HEP of stepping forward alt. LEs and laterally over his Hurrycane laying on floor for target to clear (use of his [facility] & near counter for safety)  Gait Training: Patient ambulated 100' X 2 with his rollator walker for improved fluency with cues on step length /carryover to above activities.                            PT Education - 10/28/15 1530    Education provided Yes   Education Details use of exercise equipment in fitness [facility] at [facility] community, alternate stepping forward and sideways over cane on floor.    Person(s) Educated Patient   Methods Explanation;Demonstration;Tactile cues;Verbal cues;Other (comment)  PT videotaped on his phone pt performing stepping exercise   Comprehension Verbalized understanding;Returned demonstration;Verbal cues required;Tactile cues required;Need further instruction          PT Short Term Goals - 10/20/15 1015    PT SHORT TERM GOAL #1   Title Patient demonstrates understanding of updated HEP.  (Target Date: 11/19/2015)   Time 4   Period Weeks   Status New   PT SHORT TERM GOAL #2   Title Berg Balance >/= 38/56  (Target Date: 11/19/2015)   Time 4    Period Weeks   Status New   PT SHORT TERM GOAL #3   Title Tiimed Up & Go without device <17sec with supervision.  (Target Date: 11/19/2015)   Time 4   Period Weeks   Status New   PT SHORT TERM GOAL #4   Title Patient ambulates around obstacles with SBQC 300' with supervision.  (Target Date: 11/19/2015)   Time 4   Period Weeks   Status New   PT SHORT TERM GOAL #5   Title Patient negotiates ramps & curbs with [facility] with supervision.  (Target Date: 11/19/2015)   Time 4  Period Weeks   Status New           PT Long Term Goals - 10/20/15 1015    PT LONG TERM GOAL #1   Title Patient verbalizes & demonstrates understanding of updated, ongoing fitness program / HEP. (Target Date: 12/17/2015)   Time 8   Period Weeks   Status New   PT LONG TERM GOAL #2   Title Berg Balance >45/56 to indicate lower fall risk.  (Target Date: 12/17/2015)   Time 8   Period Weeks   Status New   PT LONG TERM GOAL #3   Title Timed Up & Go without device <15 sec safely to indicate lower fall risk.  (Target Date: 12/17/2015)   Time 8   Period Weeks   Status New   PT LONG TERM GOAL #4   Title Patient able to scan environment while ambulating without fall risk.  (Target Date: 12/17/2015)   Time 8   Period Weeks   Status New   PT LONG TERM GOAL #5   Title Gait Velocity increases from 2.03 ft/sec to >2.2 ft/sec  (Target Date: 12/17/2015)   Time 8   Period Weeks   Status New               Plan - 10/28/15 1615    Clinical Impression Statement Patient was able to improve / carry over proper step length for gait velocity which improved wt acceptance onto right LE in stance. Pateint appears to understand stepping exercise near counter for safety for home.    Pt will benefit from skilled therapeutic intervention in order to improve on the following deficits Abnormal gait;Decreased activity tolerance;Decreased balance;Decreased knowledge of use of DME;Decreased mobility;Decreased strength;Impaired flexibility    Rehab Potential Good   PT Frequency 2x / week   PT Duration 8 weeks   PT Treatment/Interventions ADLs/Self Care Home Management;DME Instruction;Gait training;Stair training;Functional mobility training;Therapeutic activities;Therapeutic exercise;Balance training;Neuromuscular re-education;Patient/family education;Orthotic Fit/Training   PT Next Visit Plan Assess HEP compliance & understanding, progress stepping exercises to include turning 90* at counter   Consulted and Agree with Plan of Care Patient        Problem List Patient Active Problem List   Diagnosis Date Noted  . Dry cough 10/25/2015  . Gait disturbance, post-stroke 10/05/2015  . Presbycusis of both ears   . Advanced care planning/counseling discussion 06/23/2015  . Skin rash 06/23/2015  . Impaired mobility 04/23/2015  . HTN (hypertension) 03/18/2015  . Chronic diastolic heart failure, NYHA class 1 ([place]) 03/18/2015  . Spastic hemiparesis affecting dominant side ([place]) 07/07/2014  . Left shoulder pain 03/16/2014  . Chest pressure 02/03/2014  . Medicare annual wellness visit, subsequent 11/06/2013  . Erectile dysfunction due to arterial insufficiency 02/05/2013  . Depression   . Palpitations 06/26/2012  . HLD (hyperlipidemia)   . Restless leg syndrome   . GERD (gastroesophageal reflux disease)   . BPH with obstruction/lower urinary tract symptoms   . History of CVA (cerebrovascular accident) 03/11/2012  . Hemiparesis affecting right side as late effect of stroke ([place]) 03/11/2012  . Type 2 diabetes, controlled, with neuropathy ([place]) 03/11/2012  . OSA (obstructive sleep apnea) 03/11/2012  . CKD (chronic kidney disease) stage 3, GFR 30-59 ml/min 03/11/2012  . Migraines 03/11/2012    Celestine Prim PT, DPT 10/29/2015, 7:40 AM  [facility] [address] [place], [name], [address] Phone: [phone]   Fax:  [phone]  Name: James Bell MRN:  [id number] Date of  Birth: 1931-01-30

## 2015-11-03 ENCOUNTER — Other Ambulatory Visit: Payer: Self-pay | Admitting: *Deleted

## 2015-11-03 ENCOUNTER — Ambulatory Visit: Payer: Medicare Other | Admitting: Physical Therapy

## 2015-11-03 ENCOUNTER — Encounter: Payer: Self-pay | Admitting: Physical Therapy

## 2015-11-03 DIAGNOSIS — R269 Unspecified abnormalities of gait and mobility: Secondary | ICD-10-CM

## 2015-11-03 DIAGNOSIS — M6289 Other specified disorders of muscle: Secondary | ICD-10-CM

## 2015-11-03 DIAGNOSIS — I69951 Hemiplegia and hemiparesis following unspecified cerebrovascular disease affecting right dominant side: Secondary | ICD-10-CM | POA: Diagnosis not present

## 2015-11-03 DIAGNOSIS — I6980 Unspecified sequelae of other cerebrovascular disease: Secondary | ICD-10-CM | POA: Diagnosis not present

## 2015-11-03 DIAGNOSIS — IMO0002 Reserved for concepts with insufficient information to code with codable children: Secondary | ICD-10-CM

## 2015-11-03 DIAGNOSIS — R2689 Other abnormalities of gait and mobility: Secondary | ICD-10-CM

## 2015-11-03 DIAGNOSIS — R2681 Unsteadiness on feet: Secondary | ICD-10-CM

## 2015-11-03 DIAGNOSIS — R279 Unspecified lack of coordination: Secondary | ICD-10-CM | POA: Diagnosis not present

## 2015-11-03 DIAGNOSIS — M6249 Contracture of muscle, multiple sites: Secondary | ICD-10-CM | POA: Diagnosis not present

## 2015-11-03 DIAGNOSIS — I69898 Other sequelae of other cerebrovascular disease: Secondary | ICD-10-CM | POA: Diagnosis not present

## 2015-11-03 MED ORDER — INSULIN PEN NEEDLE 30G X 8 MM MISC: Status: DC

## 2015-11-03 NOTE — Patient Instructions (Signed)
Alternating Quick Step    Stand near sink for safety. Alternate stepping up, placing feet on step. Make sure right foot / leg does not swing around side of step.  Perform _10-15__ reps for each leg.   Copyright  VHI. All rights reserved.  Alternating Step Up / Down    Stand facing step near sink to hold for safety. Step up with right leg to stand on top of step. Step down with left foot.  Perform _10-15__ reps.  Copyright  VHI. All rights reserved.  Step-Up: to Side    Stand facing sink with step in front of sink. You stand to right side of step. Step up to side to stand on top of step. Then step down to other side of step so you are standing to left of step. Repeat go back over step.  Do _10__ times going both ways.   http://ss.exer.us/178   Copyright  VHI. All rights reserved.  Feet Together, Head Motion - Eyes Open    Stand in corner with chair back in front of you for safety. With eyes open, feet together, move head slowly: right /left, up/down, diagonals up-right to down-left and up-left to down-right. Repeat _10___ times each direction per session.  Copyright  VHI. All rights reserved.  Feet Apart, Head Motion - Eyes Closed    Stand in corner with chair back in front of you for safety. With eyes closed, feet apart, move head slowly: right /left, up/down, diagonals up-right to down-left and up-left to down-right. Repeat _10___ times each direction per session.  Copyright  VHI. All rights reserved.  Feet Apart (Compliant Surface) Head Motion - Eyes Open    Stand in corner with chair back in front of you for safety. Stand on foam with eyes open, feet apart, move head slowly: right /left, up/down, diagonals up-right to down-left and up-left to down-right. Repeat _10___ times each direction per session. Copyright  VHI. All rights reserved.

## 2015-11-04 ENCOUNTER — Ambulatory Visit: Payer: Medicare Other | Admitting: Physical Therapy

## 2015-11-04 NOTE — Therapy (Signed)
[facility] [address] [place], [name], [address] Phone: [phone]   Fax:  [phone]  Physical Therapy Treatment  Patient Details  Name: James Bell MRN: [id number] Date of Birth: 07/03/1931 Referring Provider: [name], MD  Encounter Date: 11/03/2015      PT End of Session - 11/03/15 1100    Visit Number 4   Number of Visits 17   Date for PT Re-Evaluation 12/17/15   Authorization Type Medicare G-Code & Progress Note every 10 visits   PT Start Time 0930   PT Stop Time 1015   PT Time Calculation (min) 45 min   Equipment Utilized During Treatment Gait belt   Activity Tolerance Patient tolerated treatment well   Behavior During Therapy [facility] for tasks assessed/performed      Past Medical History  Diagnosis Date  . HLD (hyperlipidemia)   . Restless leg syndrome     well controlled on mirapex  . OSA on CPAP     sleep study 01/2014 - severe, CPAP at 12, previously on nuvigil (Dr. [name])  . Type 2 diabetes, controlled, with neuropathy [facility]) 2011    [name] in [place]  . H/O hiatal hernia   . GERD (gastroesophageal reflux disease)     with sliding HH  . Migraines 03/11/12    now rare  . CVA (cerebral infarction) 03/07/12    slurred speech; right hemplegia, left handed - completed PT 07/2014 (17 sessions)  . Basal cell carcinoma of nose 2002    bridge of nose  . History of kidney problems 12/2010    lacerated left kidney after fall  . BPH with obstruction/lower urinary tract symptoms     failed flomax, uroxatral, myrbetriq - sees urology [name]) pending videourodynamics  . Depression     mild, on cymbalta per prior PCP  . Palpitations 01/2012    holter WNL, rare PAC/PVCs  . Erectile dysfunction     per urology (prostaglandin injection)  . Kidney cysts 02/2014    bilateral (renal US)  . CKD (chronic kidney disease) stage 3, GFR 30-59 ml/min 03/11/2012    Renal US 02/2014 - multiple  kidney cysts   . TIA (transient ischemic attack)   . Anemia   . Presbycusis of both ears 2016    rec amplification [facility])    Past Surgical History  Procedure Laterality Date  . Prostate surgery  02/2009    PVP (laser)  . Tonsillectomy and adenoidectomy  ~ 1945  . Hemorrhoid surgery  1991  . Cataract extraction w/ intraocular lens implant  11//2012 - 08/2011  . Inguinal hernia repair  1981; 2002    left, then repaired  . Skin cancer excision  2002    bridge of nose, BCC  . Mri brain  04/2010    WNL  . Esophagogastroduodenoscopy  2006    duodenitis, medual HH, Grade 2 reflux, mild gastritis  . Colonoscopy  03/2011    poor prep, unable to biopsy polyp in tic fold, rpt 3 mo [name])  . Abi  2007    WNL  . Urethral dilation  2014    tannenbaum  . Cystoscopy with urethral dilatation N/A 07/24/2013    Procedure: CYSTOSCOPY WITH URETHRAL DILATATION;  Surgeon: [name], MD;  [facility];  Service: Urology;  Laterality: N/A;  . Transurethral incision of prostate N/A 07/24/2013    Procedure: TRANSURETHRAL INCISION OF THE PROSTATE (TUIP);  Surgeon: [name], MD;  Location: [facility];  Service: Urology;  Laterality: N/A;  . Esophagogastroduodenoscopy  07/2011    mild chronic gastritis  . Colonoscopy  06/2011    mult polyps adenomatous, severe diverticulosis, rpt 3 yrs [name])  . Carotid endarterectomy    . Cardiac catheterization  2004    normal; [facility]  . Cardiovascular stress test  02/2014    WNL, low risk scan, EF 68%    There were no vitals filed for this visit.  Visit Diagnosis:  Hemiplegia affecting right side in right-dominant patient as late effect of cerebrovascular disease ([place])  Lack of coordination due to stroke  Abnormality of gait  Hypertonia  Weakness due to cerebrovascular accident  Unsteadiness  Balance problems      Subjective Assessment - 11/03/15 1025    Subjective He fell Saturday night walking in dark back to his cottage  from a party at [facility]. He has contusion / abrasion on right forehead, reports right shoulder & both knees. He lost confidence.    Currently in Pain? Yes   Pain Score 1    Pain Location Knee   Pain Orientation Right;Left   Pain Descriptors / Indicators Aching;Dull   Pain Type Acute pain   Pain Onset In the past 7 days   Aggravating Factors  bruised from fall   Pain Relieving Factors medication        Alternating Quick Step    Stand near sink for safety. Alternate stepping up, placing feet on step. Make sure right foot / leg does not swing around side of step.  Perform _10-15__ reps for each leg.   Copyright  VHI. All rights reserved.  Alternating Step Up / Down    Stand facing step near sink to hold for safety. Step up with right leg to stand on top of step. Step down with left foot.  Perform _10-15__ reps.  Copyright  VHI. All rights reserved.  Step-Up: to Side    Stand facing sink with step in front of sink. You stand to right side of step. Step up to side to stand on top of step. Then step down to other side of step so you are standing to left of step. Repeat go back over step.  Do _10__ times going both ways.   http://ss.exer.us/178   Copyright  VHI. All rights reserved.  Feet Together, Head Motion - Eyes Open    Stand in corner with chair back in front of you for safety. With eyes open, feet together, move head slowly: right /left, up/down, diagonals up-right to down-left and up-left to down-right. Repeat _10___ times each direction per session.  Copyright  VHI. All rights reserved.  Feet Apart, Head Motion - Eyes Closed    Stand in corner with chair back in front of you for safety. With eyes closed, feet apart, move head slowly: right /left, up/down, diagonals up-right to down-left and up-left to down-right. Repeat _10___ times each direction per session.  Copyright  VHI. All rights reserved.  Feet Apart (Compliant Surface)  Head Motion - Eyes Open    Stand in corner with chair back in front of you for safety. Stand on foam with eyes open, feet apart, move head slowly: right /left, up/down, diagonals up-right to down-left and up-left to down-right. Repeat _10___ times each direction per session. Copyright  VHI. All rights reserved.  PT Education - 11/03/15 1015    Education provided Yes   Education Details fall prevention strategies, HEP updated with step ups & corner for balance   Person(s) Educated Patient   Methods Explanation;Demonstration;Tactile cues;Verbal cues;Handout   Comprehension Verbalized understanding;Returned demonstration;Verbal cues required;Tactile cues required;Need further instruction          PT Short Term Goals - 10/20/15 1015    PT SHORT TERM GOAL #1   Title Patient demonstrates understanding of updated HEP.  (Target Date: 11/19/2015)   Time 4   Period Weeks   Status New   PT SHORT TERM GOAL #2   Title Berg Balance >/= 38/56  (Target Date: 11/19/2015)   Time 4   Period Weeks   Status New   PT SHORT TERM GOAL #3   Title Tiimed Up & Go without device <17sec with supervision.  (Target Date: 11/19/2015)   Time 4   Period Weeks   Status New   PT SHORT TERM GOAL #4   Title Patient ambulates around obstacles with SBQC 300' with supervision.  (Target Date: 11/19/2015)   Time 4   Period Weeks   Status New   PT SHORT TERM GOAL #5   Title Patient negotiates ramps & curbs with [facility] with supervision.  (Target Date: 11/19/2015)   Time 4   Period Weeks   Status New           PT Long Term Goals - 10/20/15 1015    PT LONG TERM GOAL #1   Title Patient verbalizes & demonstrates understanding of updated, ongoing fitness program / HEP. (Target Date: 12/17/2015)   Time 8   Period Weeks   Status New   PT LONG TERM GOAL #2   Title Berg Balance >45/56 to indicate lower fall risk.  (Target Date: 12/17/2015)   Time 8   Period Weeks   Status  New   PT LONG TERM GOAL #3   Title Timed Up & Go without device <15 sec safely to indicate lower fall risk.  (Target Date: 12/17/2015)   Time 8   Period Weeks   Status New   PT LONG TERM GOAL #4   Title Patient able to scan environment while ambulating without fall risk.  (Target Date: 12/17/2015)   Time 8   Period Weeks   Status New   PT LONG TERM GOAL #5   Title Gait Velocity increases from 2.03 ft/sec to >2.2 ft/sec  (Target Date: 12/17/2015)   Time 8   Period Weeks   Status New               Plan - 11/03/15 1100    Clinical Impression Statement Patient verbalized understanding of fall prevention strategies including installing lights alond driveway & sidewalk. Patient appears to understand new exercises to work on strength & balance.    Pt will benefit from skilled therapeutic intervention in order to improve on the following deficits Abnormal gait;Decreased activity tolerance;Decreased balance;Decreased knowledge of use of DME;Decreased mobility;Decreased strength;Impaired flexibility   Rehab Potential Good   PT Frequency 2x / week   PT Duration 8 weeks   PT Treatment/Interventions ADLs/Self Care Home Management;DME Instruction;Gait training;Stair training;Functional mobility training;Therapeutic activities;Therapeutic exercise;Balance training;Neuromuscular re-education;Patient/family education;Orthotic Fit/Training   PT Next Visit Plan Assess HEP compliance & understanding, progress stepping exercises to include turning 90* at counter   Consulted and Agree with Plan of Care Patient        Problem List Patient Active Problem List   Diagnosis Date  Noted  . Dry cough 10/25/2015  . Gait disturbance, post-stroke 10/05/2015  . Presbycusis of both ears   . Advanced care planning/counseling discussion 06/23/2015  . Skin rash 06/23/2015  . Impaired mobility 04/23/2015  . HTN (hypertension) 03/18/2015  . Chronic diastolic heart failure, NYHA class 1 ([place]) 03/18/2015  .  Spastic hemiparesis affecting dominant side ([place]) 07/07/2014  . Left shoulder pain 03/16/2014  . Chest pressure 02/03/2014  . Medicare annual wellness visit, subsequent 11/06/2013  . Erectile dysfunction due to arterial insufficiency 02/05/2013  . Depression   . Palpitations 06/26/2012  . HLD (hyperlipidemia)   . Restless leg syndrome   . GERD (gastroesophageal reflux disease)   . BPH with obstruction/lower urinary tract symptoms   . History of CVA (cerebrovascular accident) 03/11/2012  . Hemiparesis affecting right side as late effect of stroke ([place]) 03/11/2012  . Type 2 diabetes, controlled, with neuropathy ([place]) 03/11/2012  . OSA (obstructive sleep apnea) 03/11/2012  . CKD (chronic kidney disease) stage 3, GFR 30-59 ml/min 03/11/2012  . Migraines 03/11/2012    Paitynn Mikus PT, DPT 11/04/2015, 1:06 PM  [facility] [address] [place], [name], [address] Phone: [phone]   Fax:  [phone]  Name: James Bell MRN: [id number] Date of Birth: Jul 12, 1931

## 2015-11-05 ENCOUNTER — Ambulatory Visit: Payer: Medicare Other | Admitting: Rehabilitation

## 2015-11-05 ENCOUNTER — Ambulatory Visit: Payer: Medicare Other | Admitting: Physical Therapy

## 2015-11-05 ENCOUNTER — Encounter: Payer: Self-pay | Admitting: Rehabilitation

## 2015-11-05 DIAGNOSIS — R279 Unspecified lack of coordination: Secondary | ICD-10-CM | POA: Diagnosis not present

## 2015-11-05 DIAGNOSIS — M6249 Contracture of muscle, multiple sites: Secondary | ICD-10-CM | POA: Diagnosis not present

## 2015-11-05 DIAGNOSIS — I6980 Unspecified sequelae of other cerebrovascular disease: Secondary | ICD-10-CM | POA: Diagnosis not present

## 2015-11-05 DIAGNOSIS — R269 Unspecified abnormalities of gait and mobility: Secondary | ICD-10-CM | POA: Diagnosis not present

## 2015-11-05 DIAGNOSIS — R2681 Unsteadiness on feet: Secondary | ICD-10-CM

## 2015-11-05 DIAGNOSIS — I69898 Other sequelae of other cerebrovascular disease: Secondary | ICD-10-CM | POA: Diagnosis not present

## 2015-11-05 DIAGNOSIS — I69951 Hemiplegia and hemiparesis following unspecified cerebrovascular disease affecting right dominant side: Secondary | ICD-10-CM | POA: Diagnosis not present

## 2015-11-05 DIAGNOSIS — IMO0002 Reserved for concepts with insufficient information to code with codable children: Secondary | ICD-10-CM

## 2015-11-05 DIAGNOSIS — R2689 Other abnormalities of gait and mobility: Secondary | ICD-10-CM

## 2015-11-05 NOTE — Therapy (Signed)
[facility] [address] [place], [name], [address] Phone: [phone]   Fax:  [phone]  Physical Therapy Treatment  Patient Details  Name: James Bell MRN: [id number] Date of Birth: May 12, 1931 Referring Provider: [name], MD  Encounter Date: 11/05/2015      PT End of Session - 11/05/15 1358    Visit Number 5   Number of Visits 17   Date for PT Re-Evaluation 12/17/15   Authorization Type Medicare G-Code & Progress Note every 10 visits   PT Start Time 1400   PT Stop Time 1445   PT Time Calculation (min) 45 min   Equipment Utilized During Treatment Gait belt   Activity Tolerance Patient tolerated treatment well   Behavior During Therapy [facility] for tasks assessed/performed      Past Medical History  Diagnosis Date  . HLD (hyperlipidemia)   . Restless leg syndrome     well controlled on mirapex  . OSA on CPAP     sleep study 01/2014 - severe, CPAP at 10, previously on nuvigil (Dr. [name])  . Type 2 diabetes, controlled, with neuropathy [facility]) 2011    [name] in [place]  . H/O hiatal hernia   . GERD (gastroesophageal reflux disease)     with sliding HH  . Migraines 03/11/12    now rare  . CVA (cerebral infarction) 03/07/12    slurred speech; right hemplegia, left handed - completed PT 07/2014 (17 sessions)  . Basal cell carcinoma of nose 2002    bridge of nose  . History of kidney problems 12/2010    lacerated left kidney after fall  . BPH with obstruction/lower urinary tract symptoms     failed flomax, uroxatral, myrbetriq - sees urology [name]) pending videourodynamics  . Depression     mild, on cymbalta per prior PCP  . Palpitations 01/2012    holter WNL, rare PAC/PVCs  . Erectile dysfunction     per urology (prostaglandin injection)  . Kidney cysts 02/2014    bilateral (renal US)  . CKD (chronic kidney disease) stage 3, GFR 30-59 ml/min 03/11/2012    Renal US 02/2014 - multiple  kidney cysts   . TIA (transient ischemic attack)   . Anemia   . Presbycusis of both ears 2016    rec amplification [facility])    Past Surgical History  Procedure Laterality Date  . Prostate surgery  02/2009    PVP (laser)  . Tonsillectomy and adenoidectomy  ~ 1945  . Hemorrhoid surgery  1991  . Cataract extraction w/ intraocular lens implant  11//2012 - 08/2011  . Inguinal hernia repair  1981; 2002    left, then repaired  . Skin cancer excision  2002    bridge of nose, BCC  . Mri brain  04/2010    WNL  . Esophagogastroduodenoscopy  2006    duodenitis, medual HH, Grade 2 reflux, mild gastritis  . Colonoscopy  03/2011    poor prep, unable to biopsy polyp in tic fold, rpt 3 mo [name])  . Abi  2007    WNL  . Urethral dilation  2014    tannenbaum  . Cystoscopy with urethral dilatation N/A 07/24/2013    Procedure: CYSTOSCOPY WITH URETHRAL DILATATION;  Surgeon: [name], MD;  Location: [facility];  Service: Urology;  Laterality: N/A;  . Transurethral incision of prostate N/A 07/24/2013    Procedure: TRANSURETHRAL INCISION OF THE PROSTATE (TUIP);  Surgeon: [name], MD;  Location: [facility];  Service: Urology;  Laterality: N/A;  . Esophagogastroduodenoscopy  07/2011    mild chronic gastritis  . Colonoscopy  06/2011    mult polyps adenomatous, severe diverticulosis, rpt 3 yrs [name])  . Carotid endarterectomy    . Cardiac catheterization  2004    normal; [facility]  . Cardiovascular stress test  02/2014    WNL, low risk scan, EF 68%    There were no vitals filed for this visit.  Visit Diagnosis:  Abnormality of gait  Unsteadiness  Balance problems  Weakness due to cerebrovascular accident      Subjective Assessment - 11/05/15 1357    Subjective Reports no other falls since weekend, has done exercises once.   Patient Stated Goals He wants to work on his balance & strength including core & RLE   Currently in Pain? No/denies            NMR:  Reviewed  HEP from last session since pt did not get to perform at home.  See pt instruction from previous note from primary PT Jamey Reas, PT)  for details.  Pt continues to demonstrate decreased balance during exercises, therefore provided cues for pt to utilize LUE on counter when performing to increase safety and success with exercises.  Pt verbalized and return demonstration with increased safety.  Also note that during corner balance tasks, pt with increased difficulty with vertical head turns as well as diagonal head turns, but seems to improve within session.  Education to perform these daily to improve progress and decrease fall risk.  Progressed to RLE NMR with tall kneeling task transitioning from sitting on heels to upright B tall kneeling.  Performed x 10 reps with cues for increased hip protraction.  Then performed transitioning between tall kneeling and L half kneeling to increase R lateral weight shift, WB and increased glute med activation.  Performed x 6 reps during session at min/mod A level with tactile facilitation for improved weight shift and upright posture, R hip activation during task.  Ended session with step up exercise at stairs with LUE support tapping LLE to first step, second step and down to increase strength and activation in RLE x 5 reps>no UE support x 5 reps and vice versa with R LE tapping to increase isolated R hip and knee flexion to carryover to gait.  Pt tolerated well, but did note increased fatigue and rest required following tasks.                       PT Education - 11/05/15 1358    Education provided Yes   Education Details Education on HEP compliance   Person(s) Educated Patient   Methods Explanation   Comprehension Verbalized understanding          PT Short Term Goals - 10/20/15 1015    PT SHORT TERM GOAL #1   Title Patient demonstrates understanding of updated HEP.  (Target Date: 11/19/2015)   Time 4   Period Weeks   Status New   PT SHORT  TERM GOAL #2   Title Berg Balance >/= 38/56  (Target Date: 11/19/2015)   Time 4   Period Weeks   Status New   PT SHORT TERM GOAL #3   Title Tiimed Up & Go without device <17sec with supervision.  (Target Date: 11/19/2015)   Time 4   Period Weeks   Status New   PT SHORT TERM GOAL #4   Title Patient ambulates around obstacles  with SBQC 300' with supervision.  (Target Date: 11/19/2015)   Time 4   Period Weeks   Status New   PT SHORT TERM GOAL #5   Title Patient negotiates ramps & curbs with [facility] with supervision.  (Target Date: 11/19/2015)   Time 4   Period Weeks   Status New           PT Long Term Goals - 10/20/15 1015    PT LONG TERM GOAL #1   Title Patient verbalizes & demonstrates understanding of updated, ongoing fitness program / HEP. (Target Date: 12/17/2015)   Time 8   Period Weeks   Status New   PT LONG TERM GOAL #2   Title Berg Balance >45/56 to indicate lower fall risk.  (Target Date: 12/17/2015)   Time 8   Period Weeks   Status New   PT LONG TERM GOAL #3   Title Timed Up & Go without device <15 sec safely to indicate lower fall risk.  (Target Date: 12/17/2015)   Time 8   Period Weeks   Status New   PT LONG TERM GOAL #4   Title Patient able to scan environment while ambulating without fall risk.  (Target Date: 12/17/2015)   Time 8   Period Weeks   Status New   PT LONG TERM GOAL #5   Title Gait Velocity increases from 2.03 ft/sec to >2.2 ft/sec  (Target Date: 12/17/2015)   Time 8   Period Weeks   Status New               Plan - 11/05/15 1358    Clinical Impression Statement Skilled session focused on review of HEP from last session as well as continued NMR and strengthening of RLE.  Pt with increased fatigue following session, but tolerated well.  Continue POC.    Pt will benefit from skilled therapeutic intervention in order to improve on the following deficits Abnormal gait;Decreased activity tolerance;Decreased balance;Decreased knowledge of use of  DME;Decreased mobility;Decreased strength;Impaired flexibility   Rehab Potential Good   PT Frequency 2x / week   PT Duration 8 weeks   PT Treatment/Interventions ADLs/Self Care Home Management;DME Instruction;Gait training;Stair training;Functional mobility training;Therapeutic activities;Therapeutic exercise;Balance training;Neuromuscular re-education;Patient/family education;Orthotic Fit/Training   PT Next Visit Plan  progress stepping exercises to include turning 90* at counter   Consulted and Agree with Plan of Care Patient        Problem List Patient Active Problem List   Diagnosis Date Noted  . Dry cough 10/25/2015  . Gait disturbance, post-stroke 10/05/2015  . Presbycusis of both ears   . Advanced care planning/counseling discussion 06/23/2015  . Skin rash 06/23/2015  . Impaired mobility 04/23/2015  . HTN (hypertension) 03/18/2015  . Chronic diastolic heart failure, NYHA class 1 ([place]) 03/18/2015  . Spastic hemiparesis affecting dominant side ([place]) 07/07/2014  . Left shoulder pain 03/16/2014  . Chest pressure 02/03/2014  . Medicare annual wellness visit, subsequent 11/06/2013  . Erectile dysfunction due to arterial insufficiency 02/05/2013  . Depression   . Palpitations 06/26/2012  . HLD (hyperlipidemia)   . Restless leg syndrome   . GERD (gastroesophageal reflux disease)   . BPH with obstruction/lower urinary tract symptoms   . History of CVA (cerebrovascular accident) 03/11/2012  . Hemiparesis affecting right side as late effect of stroke ([place]) 03/11/2012  . Type 2 diabetes, controlled, with neuropathy ([place]) 03/11/2012  . OSA (obstructive sleep apnea) 03/11/2012  . CKD (chronic kidney disease) stage 3, GFR 30-59 ml/min 03/11/2012  .  Migraines 03/11/2012    Cameron Sprang, PT, MPT [facility] [address] [place], [name], [address] Phone: [phone]   Fax:  [phone] 11/05/2015, 3:54 PM  Name: NICCO YAP MRN: [id number] Date of Birth: 1931/09/10

## 2015-11-09 ENCOUNTER — Ambulatory Visit (HOSPITAL_BASED_OUTPATIENT_CLINIC_OR_DEPARTMENT_OTHER): Payer: Medicare Other | Admitting: Physical Medicine & Rehabilitation

## 2015-11-09 ENCOUNTER — Ambulatory Visit: Payer: Medicare Other | Admitting: Physical Therapy

## 2015-11-09 ENCOUNTER — Encounter: Payer: Self-pay | Admitting: Physical Medicine & Rehabilitation

## 2015-11-09 ENCOUNTER — Encounter: Payer: Medicare Other | Attending: Physical Medicine & Rehabilitation

## 2015-11-09 VITALS — BP 138/53 | HR 79

## 2015-11-09 DIAGNOSIS — R269 Unspecified abnormalities of gait and mobility: Secondary | ICD-10-CM | POA: Diagnosis not present

## 2015-11-09 DIAGNOSIS — I69351 Hemiplegia and hemiparesis following cerebral infarction affecting right dominant side: Secondary | ICD-10-CM | POA: Diagnosis not present

## 2015-11-09 DIAGNOSIS — G8110 Spastic hemiplegia affecting unspecified side: Secondary | ICD-10-CM | POA: Diagnosis not present

## 2015-11-09 DIAGNOSIS — I69398 Other sequelae of cerebral infarction: Secondary | ICD-10-CM | POA: Diagnosis not present

## 2015-11-09 DIAGNOSIS — M7989 Other specified soft tissue disorders: Secondary | ICD-10-CM | POA: Diagnosis not present

## 2015-11-09 NOTE — Progress Notes (Signed)
Subjective:    Patient ID: James Bell, male    DOB: 1931-08-29, 82 y.o.   MRN: [id number]  HPI   Chief complaint walking worse  82 year old male who had a CVA in 2013 and has had chronic gait disorder. He was ambulating on a regular basis for exercise using a quad cane but has had a fall in December and a fall in February. Fortunately he had no loss of consciousness no serious injuries however this did shake his confidence. At last visit on 10/04/2014 we ordered some physical therapy which started in February. Patient had another fall around Valentine's Day. Once again no loss of consciousness.  No persistent pain.This was at night outside his home. Had some bruising on the right side of his face. No other injuries. Patient was walking with a cane at the time.  He has resumed physical therapy at [facility]. Patient has had 4 or 5 PT visits.  Has additional visits scheduled. No excessive tone noted in the right arm.  Patient had lost 15 pounds has gained 12 pounds back. Pain Inventory Average Pain 0 Pain Right Now 0 My pain is none  In the last 24 hours, has pain interfered with the following? General activity 0 Relation with others 0 Enjoyment of life 0 What TIME of day is your pain at its worst? none Sleep (in general) Fair  Pain is worse with: . Pain improves with: . Relief from Meds: .  Mobility walk with assistance use a cane use a walker ability to climb steps?  yes do you drive?  yes  Function retired  Neuro/Psych No problems in this area  Prior Studies Any changes since last visit?  no  Physicians involved in your care Any changes since last visit?  no   Family History  Problem Relation Age of Onset  . Stroke Father   . Lung cancer Father 62     smoker  . Stroke Brother   . Diabetes Brother 33  . Coronary artery disease Neg Hx   . Lung cancer Brother   . Alcoholism Sister   . Kidney disease Sister    Social History   Social  History  . Marital Status: Unknown    Spouse Name: N/A  . Number of Children: 2  . Years of Education: MD   Occupational History  . retired   .     Social History Main Topics  . Smoking status: Never Smoker   . Smokeless tobacco: Never Used     Comment: "used to smoke pipe when I was younger"  . Alcohol Use: 2.4 oz/week    2 Glasses of wine, 2 Shots of liquor per week     Comment: occasional  . Drug Use: No  . Sexual Activity: Yes   Other Topics Concern  . None   Social History Narrative   Caffeine: 1 cup coffee/day    Occupation: ophthalmologist, retired   Edu: MD   Activity: going to gym 3x/wk with trainer   Diet: some water, fruits/vegetables some, red meat a few times, fish 1x/wk   POA - [name] (Daughter)   Past Surgical History  Procedure Laterality Date  . Prostate surgery  02/2009    PVP (laser)  . Tonsillectomy and adenoidectomy  ~ 1945  . Hemorrhoid surgery  1991  . Cataract extraction w/ intraocular lens implant  11//2012 - 08/2011  . Inguinal hernia repair  1981; 2002    left, then repaired  . Skin  cancer excision  2002    bridge of nose, BCC  . Mri brain  04/2010    WNL  . Esophagogastroduodenoscopy  2006    duodenitis, medual HH, Grade 2 reflux, mild gastritis  . Colonoscopy  03/2011    poor prep, unable to biopsy polyp in tic fold, rpt 3 mo [name])  . Abi  2007    WNL  . Urethral dilation  2014    tannenbaum  . Cystoscopy with urethral dilatation N/A 07/24/2013    Procedure: CYSTOSCOPY WITH URETHRAL DILATATION;  Surgeon: [name], MD;  Location: [facility];  Service: Urology;  Laterality: N/A;  . Transurethral incision of prostate N/A 07/24/2013    Procedure: TRANSURETHRAL INCISION OF THE PROSTATE (TUIP);  Surgeon: [name], MD;  Location: [facility];  Service: Urology;  Laterality: N/A;  . Esophagogastroduodenoscopy  07/2011    mild chronic gastritis  . Colonoscopy  06/2011    mult polyps adenomatous, severe diverticulosis,  rpt 3 yrs [name])  . Carotid endarterectomy    . Cardiac catheterization  2004    normal; [facility]  . Cardiovascular stress test  02/2014    WNL, low risk scan, EF 68%   Past Medical History  Diagnosis Date  . HLD (hyperlipidemia)   . Restless leg syndrome     well controlled on mirapex  . OSA on CPAP     sleep study 01/2014 - severe, CPAP at 10, previously on nuvigil (Dr. [name])  . Type 2 diabetes, controlled, with neuropathy [facility]) 2011    [name] in [place]  . H/O hiatal hernia   . GERD (gastroesophageal reflux disease)     with sliding HH  . Migraines 03/11/12    now rare  . CVA (cerebral infarction) 03/07/12    slurred speech; right hemplegia, left handed - completed PT 07/2014 (17 sessions)  . Basal cell carcinoma of nose 2002    bridge of nose  . History of kidney problems 12/2010    lacerated left kidney after fall  . BPH with obstruction/lower urinary tract symptoms     failed flomax, uroxatral, myrbetriq - sees urology [name]) pending videourodynamics  . Depression     mild, on cymbalta per prior PCP  . Palpitations 01/2012    holter WNL, rare PAC/PVCs  . Erectile dysfunction     per urology (prostaglandin injection)  . Kidney cysts 02/2014    bilateral (renal US)  . CKD (chronic kidney disease) stage 3, GFR 30-59 ml/min 03/11/2012    Renal US 02/2014 - multiple kidney cysts   . TIA (transient ischemic attack)   . Anemia   . Presbycusis of both ears 2016    rec amplification ([place])   BP [id number] mmHg  Pulse 79  SpO2 97%  Opioid Risk Score:   Fall Risk Score:  `1  Depression screen PHQ 2/9  Depression screen [facility] 2/9 10/05/2015 06/23/2015 04/08/2015 11/06/2013 07/05/2012  Decreased Interest 0 0 0 1 0  Down, Depressed, Hopeless 0 0 0 1 0  PHQ - 2 Score 0 0 0 2 0  Altered sleeping - - - 3 -  Tired, decreased energy - - - 1 -  Change in appetite - - - 1 -  Feeling bad or failure about yourself  - - - 0 -  Trouble concentrating - - - 1 -  Moving slowly or  fidgety/restless - - - 0 -  Suicidal thoughts - - - 0 -  PHQ-9 Score - - -  8 -    Review of Systems     Objective:   Physical Exam  Constitutional: He is oriented to person, place, and time. He appears well-developed and well-nourished.  HENT:  Head: Normocephalic and atraumatic.  Eyes: Conjunctivae and EOM are normal. Pupils are equal, round, and reactive to light.  Musculoskeletal:  No pain with ambulation  No pain with upper or lower extremity active or passive range of motion on the right side.  No pain with left upper shin me left lower extremity active range of motion.  Neurological: He is alert and oriented to person, place, and time. No sensory deficit. Coordination abnormal.  Decreased fine motor dexterity right upper extremity    Skin: Skin is warm and dry.  Psychiatric: He has a normal mood and affect.  Nursing note and vitals reviewed.   Modified Ashworth score of 0 at the biceps 1 at the finger and wrist flexors. During ambulation excess biceps activity with elbow flexion during gait, approximately 90 Motor strength is 3 minus at the deltoid, biceps, triceps, grip 4 minus at the hip flexor and extensor and 3 minus ankle dorsiflexor Ambulates without evidence of toe drag or knee instability. Short step length but cues himself to take a larger step. Good heel toe pattern        Assessment & Plan:  1. Right spastic hemiplegia secondary to CVA, 03/07/2012. Has had some decline in walking ability and balance, this is likely multifactorial. Patient has had some recent weight gain as well as fall causing fear of additional falls. Would recommend continued outpatient physical therapy. Would like to recheck after physical therapy has finished. Patient with chronic gait disorder status post CVA  Recommend weight loss, discussed drinking a full glass of water before every meal to reduce meal intake  2. Right upper extremity spasticity mainly during walking. At this  point we'll hold off on Botox injection. If this increases and particularly if finger and wrist flexors become more spastic would repeat Botox but at a lower dose.100 units would be planned May consider doing Botox to biceps only if his finger flexor spasticity does not increase. He does have excessive elbow flexion during ambulation which can also affect balance  3. Gait disorder status post CVA, I do not see any motor weakness that explains his decline in gait. I do not think any additional workup is needed. I think that patient took some chances such as ambulating at night outside his home without lites on. He states that he refused an offer from friends to walk him to his door.  We discussed that he needs to avoid ambulating in the dark without supervision. I do think some additional physical therapy should improve his balance and reduce fear of falling

## 2015-11-09 NOTE — Patient Instructions (Signed)
Rec additional weight loss Resume walking  Cont PT

## 2015-11-10 ENCOUNTER — Ambulatory Visit: Payer: Medicare Other | Admitting: Physical Therapy

## 2015-11-10 DIAGNOSIS — R269 Unspecified abnormalities of gait and mobility: Secondary | ICD-10-CM | POA: Diagnosis not present

## 2015-11-10 DIAGNOSIS — M6289 Other specified disorders of muscle: Secondary | ICD-10-CM

## 2015-11-10 DIAGNOSIS — I69898 Other sequelae of other cerebrovascular disease: Secondary | ICD-10-CM | POA: Diagnosis not present

## 2015-11-10 DIAGNOSIS — R279 Unspecified lack of coordination: Secondary | ICD-10-CM | POA: Diagnosis not present

## 2015-11-10 DIAGNOSIS — I69951 Hemiplegia and hemiparesis following unspecified cerebrovascular disease affecting right dominant side: Secondary | ICD-10-CM | POA: Diagnosis not present

## 2015-11-10 DIAGNOSIS — R2681 Unsteadiness on feet: Secondary | ICD-10-CM

## 2015-11-10 DIAGNOSIS — R531 Weakness: Secondary | ICD-10-CM

## 2015-11-10 DIAGNOSIS — IMO0002 Reserved for concepts with insufficient information to code with codable children: Secondary | ICD-10-CM

## 2015-11-10 DIAGNOSIS — M6249 Contracture of muscle, multiple sites: Secondary | ICD-10-CM | POA: Diagnosis not present

## 2015-11-10 DIAGNOSIS — M6281 Muscle weakness (generalized): Secondary | ICD-10-CM

## 2015-11-10 DIAGNOSIS — R278 Other lack of coordination: Secondary | ICD-10-CM

## 2015-11-10 DIAGNOSIS — R2689 Other abnormalities of gait and mobility: Secondary | ICD-10-CM

## 2015-11-10 DIAGNOSIS — I6980 Unspecified sequelae of other cerebrovascular disease: Secondary | ICD-10-CM | POA: Diagnosis not present

## 2015-11-10 NOTE — Therapy (Signed)
[facility] [address] [place], [name], [address] Phone: [phone]   Fax:  [phone]  Physical Therapy Treatment  Patient Details  Name: James Bell MRN: [id number] Date of Birth: [birth year] Referring Provider: [name], MD  Encounter Date: 11/10/2015      PT End of Session - 11/10/15 1311    Visit Number 6   Number of Visits 17   Date for PT Re-Evaluation 12/17/15   Authorization Type Medicare G-Code & Progress Note every 10 visits   PT Start Time 1230   PT Stop Time 1311   PT Time Calculation (min) 41 min   Equipment Utilized During Treatment Gait belt   Activity Tolerance Patient tolerated treatment well   Behavior During Therapy [facility] for tasks assessed/performed      Past Medical History  Diagnosis Date  . HLD (hyperlipidemia)   . Restless leg syndrome     well controlled on mirapex  . OSA on CPAP     sleep study 01/2014 - severe, CPAP at 10, previously on nuvigil (Dr. [name])  . Type 2 diabetes, controlled, with neuropathy [facility]) 2011    [name] in [place]  . H/O hiatal hernia   . GERD (gastroesophageal reflux disease)     with sliding HH  . Migraines 03/11/12    now rare  . CVA (cerebral infarction) 03/07/12    slurred speech; right hemplegia, left handed - completed PT 07/2014 (17 sessions)  . Basal cell carcinoma of nose 2002    bridge of nose  . History of kidney problems 12/2010    lacerated left kidney after fall  . BPH with obstruction/lower urinary tract symptoms     failed flomax, uroxatral, myrbetriq - sees urology [name]) pending videourodynamics  . Depression     mild, on cymbalta per prior PCP  . Palpitations 01/2012    holter WNL, rare PAC/PVCs  . Erectile dysfunction     per urology (prostaglandin injection)  . Kidney cysts 02/2014    bilateral (renal US)  . CKD (chronic kidney disease) stage 3, GFR 30-59 ml/min 03/11/2012    Renal US 02/2014 - multiple  kidney cysts   . TIA (transient ischemic attack)   . Anemia   . Presbycusis of both ears 2016    rec amplification [facility])    Past Surgical History  Procedure Laterality Date  . Prostate surgery  02/2009    PVP (laser)  . Tonsillectomy and adenoidectomy  ~ 1945  . Hemorrhoid surgery  1991  . Cataract extraction w/ intraocular lens implant  11//2012 - 08/2011  . Inguinal hernia repair  1981; 2002    left, then repaired  . Skin cancer excision  2002    bridge of nose, BCC  . Mri brain  04/2010    WNL  . Esophagogastroduodenoscopy  2006    duodenitis, medual HH, Grade 2 reflux, mild gastritis  . Colonoscopy  03/2011    poor prep, unable to biopsy polyp in tic fold, rpt 3 mo [name])  . Abi  2007    WNL  . Urethral dilation  2014    tannenbaum  . Cystoscopy with urethral dilatation N/A 07/24/2013    Procedure: CYSTOSCOPY WITH URETHRAL DILATATION;  Surgeon: [name], MD;  Location: [facility]S;  Service: Urology;  Laterality: N/A;  . Transurethral incision of prostate N/A 07/24/2013    Procedure: TRANSURETHRAL INCISION OF THE PROSTATE (TUIP);  Surgeon: [name], MD;  Location: [facility];  Service: Urology;  Laterality: N/A;  . Esophagogastroduodenoscopy  07/2011    mild chronic gastritis  . Colonoscopy  06/2011    mult polyps adenomatous, severe diverticulosis, rpt 3 yrs [name])  . Carotid endarterectomy    . Cardiac catheterization  2004    normal; [facility]  . Cardiovascular stress test  02/2014    WNL, low risk scan, EF 68%    There were no vitals filed for this visit.  Visit Diagnosis:  Abnormality of gait  Unsteadiness  Balance problems  Weakness due to cerebrovascular accident  Hemiplegia affecting right side in right-dominant patient as late effect of cerebrovascular disease ([place])  Lack of coordination due to stroke  Hypertonia  Decreased coordination  Muscle weakness  Generalized weakness      Subjective Assessment - 11/10/15 1233     Subjective Had some diarrhea this morning; now resolved but still feels weak.   Patient Stated Goals He wants to work on his balance & strength including core & RLE   Currently in Pain? No/denies            [facility] PT Assessment - 11/10/15 1314    Strength   Right Hip Flexion 3+/5   Right Hip Extension 3-/5   Right Hip ABduction 3-/5                     [facility] Adult PT Treatment/Exercise - 11/10/15 1256    Exercises   Exercises Knee/Hip   Knee/Hip Exercises: Standing   Knee Flexion Both;10 reps   Hip Abduction Both;10 reps;Knee straight   Hip Extension Both;10 reps;Knee straight   Knee/Hip Exercises: Seated   Long Arc Quad Right;10 reps   Long Arc Quad Weight 2 lbs.   Marching Right;10 reps   Marching Weights 2 lbs.   Hamstring Curl Right;10 reps   Hamstring Limitations red theraband   Knee/Hip Exercises: Supine   Bridges Both;10 reps   Knee/Hip Exercises: Sidelying   Clams R x 10 with cues to maintain neutral hips   Knee/Hip Exercises: Prone   Hamstring Curl 10 reps   Hamstring Curl Limitations cues for eccentric control (pt demonstrates poor control)   Hip Extension Right;10 reps         Gait with SBQC and min A with 180 degree turns x 200'          PT Short Term Goals - 10/20/15 1015    PT SHORT TERM GOAL #1   Title Patient demonstrates understanding of updated HEP.  (Target Date: 11/19/2015)   Time 4   Period Weeks   Status New   PT SHORT TERM GOAL #2   Title Berg Balance >/= 38/56  (Target Date: 11/19/2015)   Time 4   Period Weeks   Status New   PT SHORT TERM GOAL #3   Title Tiimed Up & Go without device <17sec with supervision.  (Target Date: 11/19/2015)   Time 4   Period Weeks   Status New   PT SHORT TERM GOAL #4   Title Patient ambulates around obstacles with SBQC 300' with supervision.  (Target Date: 11/19/2015)   Time 4   Period Weeks   Status New   PT SHORT TERM GOAL #5   Title Patient negotiates ramps & curbs with [facility] with  supervision.  (Target Date: 11/19/2015)   Time 4   Period Weeks   Status New           PT Long Term Goals -  10/20/15 1015    PT LONG TERM GOAL #1   Title Patient verbalizes & demonstrates understanding of updated, ongoing fitness program / HEP. (Target Date: 12/17/2015)   Time 8   Period Weeks   Status New   PT LONG TERM GOAL #2   Title Berg Balance >45/56 to indicate lower fall risk.  (Target Date: 12/17/2015)   Time 8   Period Weeks   Status New   PT LONG TERM GOAL #3   Title Timed Up & Go without device <15 sec safely to indicate lower fall risk.  (Target Date: 12/17/2015)   Time 8   Period Weeks   Status New   PT LONG TERM GOAL #4   Title Patient able to scan environment while ambulating without fall risk.  (Target Date: 12/17/2015)   Time 8   Period Weeks   Status New   PT LONG TERM GOAL #5   Title Gait Velocity increases from 2.03 ft/sec to >2.2 ft/sec  (Target Date: 12/17/2015)   Time 8   Period Weeks   Status New               Plan - 11/10/15 1311    Clinical Impression Statement Pt reported feeling "weaker" on his R side since fall ~ 1 week ago.  MMT revealed no difference in strength but will monitor closely.  Will continue to benefit from PT to maximize function.   PT Next Visit Plan  progress stepping exercises, balance/strengthening   Consulted and Agree with Plan of Care Patient        Problem List Patient Active Problem List   Diagnosis Date Noted  . Dry cough 10/25/2015  . Gait disturbance, post-stroke 10/05/2015  . Presbycusis of both ears   . Advanced care planning/counseling discussion 06/23/2015  . Skin rash 06/23/2015  . Impaired mobility 04/23/2015  . HTN (hypertension) 03/18/2015  . Chronic diastolic heart failure, NYHA class 1 ([place]) 03/18/2015  . Spastic hemiparesis affecting dominant side ([place]) 07/07/2014  . Left shoulder pain 03/16/2014  . Chest pressure 02/03/2014  . Medicare annual wellness visit, subsequent 11/06/2013  .  Erectile dysfunction due to arterial insufficiency 02/05/2013  . Depression   . Palpitations 06/26/2012  . HLD (hyperlipidemia)   . Restless leg syndrome   . GERD (gastroesophageal reflux disease)   . BPH with obstruction/lower urinary tract symptoms   . History of CVA (cerebrovascular accident) 03/11/2012  . Hemiparesis affecting right side as late effect of stroke ([place]) 03/11/2012  . Type 2 diabetes, controlled, with neuropathy ([place]) 03/11/2012  . OSA (obstructive sleep apnea) 03/11/2012  . CKD (chronic kidney disease) stage 3, GFR 30-59 ml/min 03/11/2012  . Migraines 03/11/2012   Laureen Abrahams, PT, DPT 11/10/2015 1:14 PM  [facility] [address] [place], [name], [address] Phone: [phone]   Fax:  [phone]  Name: EVERARDO CROUSER MRN: [id number] Date of Birth: Nov 18, 1930

## 2015-11-11 ENCOUNTER — Ambulatory Visit: Payer: Medicare Other | Admitting: *Deleted

## 2015-11-15 ENCOUNTER — Ambulatory Visit: Payer: Medicare Other | Admitting: Physical Therapy

## 2015-11-15 ENCOUNTER — Encounter: Payer: Self-pay | Admitting: Physical Therapy

## 2015-11-15 DIAGNOSIS — R269 Unspecified abnormalities of gait and mobility: Secondary | ICD-10-CM | POA: Diagnosis not present

## 2015-11-15 DIAGNOSIS — R279 Unspecified lack of coordination: Secondary | ICD-10-CM | POA: Diagnosis not present

## 2015-11-15 DIAGNOSIS — I69951 Hemiplegia and hemiparesis following unspecified cerebrovascular disease affecting right dominant side: Secondary | ICD-10-CM

## 2015-11-15 DIAGNOSIS — I6980 Unspecified sequelae of other cerebrovascular disease: Secondary | ICD-10-CM | POA: Diagnosis not present

## 2015-11-15 DIAGNOSIS — R2681 Unsteadiness on feet: Secondary | ICD-10-CM

## 2015-11-15 DIAGNOSIS — M6249 Contracture of muscle, multiple sites: Secondary | ICD-10-CM | POA: Diagnosis not present

## 2015-11-15 DIAGNOSIS — IMO0002 Reserved for concepts with insufficient information to code with codable children: Secondary | ICD-10-CM

## 2015-11-15 DIAGNOSIS — R2689 Other abnormalities of gait and mobility: Secondary | ICD-10-CM

## 2015-11-15 DIAGNOSIS — I69898 Other sequelae of other cerebrovascular disease: Secondary | ICD-10-CM | POA: Diagnosis not present

## 2015-11-15 NOTE — Therapy (Signed)
[facility] [address] [place], [name], [address] Phone: [phone]   Fax:  [phone]  Physical Therapy Treatment  Patient Details  Name: James Bell MRN: [id number] Date of Birth: 02/19/1931 Referring Provider: [name], MD  Encounter Date: 11/15/2015      PT End of Session - 11/15/15 1229    Visit Number 7   Number of Visits 17   Date for PT Re-Evaluation 12/17/15   Authorization Type Medicare G-Code & Progress Note every 10 visits   PT Start Time 0930   PT Stop Time 1015   PT Time Calculation (min) 45 min   Equipment Utilized During Treatment Gait belt   Activity Tolerance Patient tolerated treatment well   Behavior During Therapy [facility] for tasks assessed/performed      Past Medical History  Diagnosis Date  . HLD (hyperlipidemia)   . Restless leg syndrome     well controlled on mirapex  . OSA on CPAP     sleep study 01/2014 - severe, CPAP at 10, previously on nuvigil (Dr. [name])  . Type 2 diabetes, controlled, with neuropathy [facility]) 2011    [name] in [place]  . H/O hiatal hernia   . GERD (gastroesophageal reflux disease)     with sliding HH  . Migraines 03/11/12    now rare  . CVA (cerebral infarction) 03/07/12    slurred speech; right hemplegia, left handed - completed PT 07/2014 (17 sessions)  . Basal cell carcinoma of nose 2002    bridge of nose  . History of kidney problems 12/2010    lacerated left kidney after fall  . BPH with obstruction/lower urinary tract symptoms     failed flomax, uroxatral, myrbetriq - sees urology [name]) pending videourodynamics  . Depression     mild, on cymbalta per prior PCP  . Palpitations 01/2012    holter WNL, rare PAC/PVCs  . Erectile dysfunction     per urology (prostaglandin injection)  . Kidney cysts 02/2014    bilateral (renal US)  . CKD (chronic kidney disease) stage 3, GFR 30-59 ml/min 03/11/2012    Renal US 02/2014 - multiple  kidney cysts   . TIA (transient ischemic attack)   . Anemia   . Presbycusis of both ears 2016    rec amplification [facility])    Past Surgical History  Procedure Laterality Date  . Prostate surgery  02/2009    PVP (laser)  . Tonsillectomy and adenoidectomy  ~ 1945  . Hemorrhoid surgery  1991  . Cataract extraction w/ intraocular lens implant  11//2012 - 08/2011  . Inguinal hernia repair  1981; 2002    left, then repaired  . Skin cancer excision  2002    bridge of nose, BCC  . Mri brain  04/2010    WNL  . Esophagogastroduodenoscopy  2006    duodenitis, medual HH, Grade 2 reflux, mild gastritis  . Colonoscopy  03/2011    poor prep, unable to biopsy polyp in tic fold, rpt 3 mo [name])  . Abi  2007    WNL  . Urethral dilation  2014    tannenbaum  . Cystoscopy with urethral dilatation N/A 07/24/2013    Procedure: CYSTOSCOPY WITH URETHRAL DILATATION;  Surgeon: [name], MD;  [facility];  Service: Urology;  Laterality: N/A;  . Transurethral incision of prostate N/A 07/24/2013    Procedure: TRANSURETHRAL INCISION OF THE PROSTATE (TUIP);  Surgeon: [name], MD;  Location: [facility];  Service: Urology;  Laterality: N/A;  . Esophagogastroduodenoscopy  07/2011    mild chronic gastritis  . Colonoscopy  06/2011    mult polyps adenomatous, severe diverticulosis, rpt 3 yrs [name])  . Carotid endarterectomy    . Cardiac catheterization  2004    normal; [facility]  . Cardiovascular stress test  02/2014    WNL, low risk scan, EF 68%    There were no vitals filed for this visit.  Visit Diagnosis:  Abnormality of gait  Unsteadiness  Balance problems  Weakness due to cerebrovascular accident  Hemiplegia affecting right side in right-dominant patient as late effect of cerebrovascular disease ([place])  Lack of coordination due to stroke      Subjective Assessment - 11/15/15 1018    Subjective --                         [facility] Adult PT  Treatment/Exercise - 11/15/15 0930    Ambulation/Gait   Ambulation/Gait Yes   Ambulation/Gait Assistance 5: Supervision   Ambulation Distance (Feet) 350 Feet   Assistive device Small based quad cane   Gait Pattern Step-through pattern;Decreased arm swing - right;Decreased step length - right;Decreased stance time - right;Decreased stride length;Decreased hip/knee flexion - right;Right hip hike;Right foot flat;Right flexed knee in stance;Ataxic;Narrow base of support;Trunk rotated posteriorly on right;Decreased trunk rotation   Ambulation Surface Indoor;Level   Stairs Yes   Stairs Assistance 5: Supervision   Stairs Assistance Details (indicate cue type and reason) position related to rail support. Technique if rail on right side.    Stair Management Technique One rail Left;Alternating pattern;Forwards   Number of Stairs 4   Ramp 5: Supervision  SBQC   Ramp Details (indicate cue type and reason) cues on posture, weight shift   Curb 5: Supervision  SBQC   Curb Details (indicate cue type and reason) technique including step thru, Right knee flexion to clear foot   High Level Balance   High Level Balance Activities Side stepping;Backward walking;Head turns;Negotitating around obstacles  head turns sideways, up/down & diagonals to scan   High Level Balance Comments tactile cues for balance reactions, maintain path & pace with scanning   Exercises   Exercises --   Knee/Hip Exercises: Standing   Knee Flexion --   Hip Abduction --   Hip Extension --   Knee/Hip Exercises: Seated   Long Arc Quad --   Long Arc Quad [employer] --   Marching --   Film/video editor --   Hamstring Curl --   Hamstring Limitations --   Knee/Hip Exercises: Supine   Bridges --   Knee/Hip Exercises: Sidelying   Clams --   Knee/Hip Exercises: Prone   Hamstring Curl --   Hamstring Curl Limitations --   Hip Extension --             Balance Exercises - 11/15/15 1224    Balance Exercises: Standing   Standing  Eyes Opened Narrow base of support (BOS);Wide (BOA);Head turns;Foam/compliant surface;Solid surface  Narrow solid surface, wide stance on foam, head 4 movements   Standing Eyes Closed Wide (BOA);Head turns  4 directions of head movement   Gait with Head Turns Forward             PT Short Term Goals - 11/15/15 1232    PT SHORT TERM GOAL #1   Title Patient demonstrates understanding of updated HEP.  (Target Date: 11/19/2015)   Baseline MET 11/15/2015   Time  4   Period Weeks   Status Achieved   PT SHORT TERM GOAL #2   Title Berg Balance >/= [phone]  (Target Date: 11/19/2015)   Time 4   Period Weeks   Status New   PT SHORT TERM GOAL #3   Title Tiimed Up & Go without device <17sec with supervision.  (Target Date: 11/19/2015)   Time 4   Period Weeks   Status New   PT SHORT TERM GOAL #4   Title Patient ambulates around obstacles with SBQC 300' with supervision.  (Target Date: 11/19/2015)   Baseline MET 11/15/2015   Time 4   Period Weeks   Status Achieved   PT SHORT TERM GOAL #5   Title Patient negotiates ramps & curbs with [facility] with supervision.  (Target Date: 11/19/2015)   Baseline MET 11/15/2015   Time 4   Period Weeks   Status Achieved           PT Long Term Goals - 10/20/15 1015    PT LONG TERM GOAL #1   Title Patient verbalizes & demonstrates understanding of updated, ongoing fitness program / HEP. (Target Date: 12/17/2015)   Time 8   Period Weeks   Status New   PT LONG TERM GOAL #2   Title Berg Balance >45/56 to indicate lower fall risk.  (Target Date: 12/17/2015)   Time 8   Period Weeks   Status New   PT LONG TERM GOAL #3   Title Timed Up & Go without device <15 sec safely to indicate lower fall risk.  (Target Date: 12/17/2015)   Time 8   Period Weeks   Status New   PT LONG TERM GOAL #4   Title Patient able to scan environment while ambulating without fall risk.  (Target Date: 12/17/2015)   Time 8   Period Weeks   Status New   PT LONG TERM GOAL #5   Title Gait  Velocity increases from 2.03 ft/sec to >2.2 ft/sec  (Target Date: 12/17/2015)   Time 8   Period Weeks   Status New               Plan - 11/15/15 1230    Clinical Impression Statement Patient improved knee stability with improved step length in gait. Patient needed less assist on barriers today. Patient is on target to met 3 of 5 STGs   Pt will benefit from skilled therapeutic intervention in order to improve on the following deficits Abnormal gait;Decreased activity tolerance;Decreased balance;Decreased knowledge of use of DME;Decreased mobility;Decreased strength;Impaired flexibility   Rehab Potential Good   PT Frequency 2x / week   PT Duration 8 weeks   PT Treatment/Interventions ADLs/Self Care Home Management;DME Instruction;Gait training;Stair training;Functional mobility training;Therapeutic activities;Therapeutic exercise;Balance training;Neuromuscular re-education;Patient/family education;Orthotic Fit/Training   PT Next Visit Plan assess remaining STGs   Consulted and Agree with Plan of Care Patient        Problem List Patient Active Problem List   Diagnosis Date Noted  . Dry cough 10/25/2015  . Gait disturbance, post-stroke 10/05/2015  . Presbycusis of both ears   . Advanced care planning/counseling discussion 06/23/2015  . Skin rash 06/23/2015  . Impaired mobility 04/23/2015  . HTN (hypertension) 03/18/2015  . Chronic diastolic heart failure, NYHA class 1 ([place]) 03/18/2015  . Spastic hemiparesis affecting dominant side ([place]) 07/07/2014  . Left shoulder pain 03/16/2014  . Chest pressure 02/03/2014  . Medicare annual wellness visit, subsequent 11/06/2013  . Erectile dysfunction due to arterial insufficiency 02/05/2013  .  Depression   . Palpitations 06/26/2012  . HLD (hyperlipidemia)   . Restless leg syndrome   . GERD (gastroesophageal reflux disease)   . BPH with obstruction/lower urinary tract symptoms   . History of CVA (cerebrovascular accident) 03/11/2012   . Hemiparesis affecting right side as late effect of stroke ([place]) 03/11/2012  . Type 2 diabetes, controlled, with neuropathy ([place]) 03/11/2012  . OSA (obstructive sleep apnea) 03/11/2012  . CKD (chronic kidney disease) stage 3, GFR 30-59 ml/min 03/11/2012  . Migraines 03/11/2012    WALDRON,ROBIN PT, DPT 11/15/2015, 1:43 PM  [facility] [address] [place], [name], [address] Phone: [phone]   Fax:  [phone]  Name: James Bell MRN: [id number] Date of Birth: 03-Nov-1930

## 2015-11-16 ENCOUNTER — Other Ambulatory Visit: Payer: Self-pay | Admitting: *Deleted

## 2015-11-16 ENCOUNTER — Ambulatory Visit: Payer: Medicare Other | Admitting: Physical Therapy

## 2015-11-16 MED ORDER — INSULIN PEN NEEDLE 30G X 8 MM MISC: Status: DC

## 2015-11-18 ENCOUNTER — Encounter: Payer: Self-pay | Admitting: Physical Therapy

## 2015-11-18 ENCOUNTER — Ambulatory Visit: Payer: Medicare Other | Attending: Physical Medicine & Rehabilitation | Admitting: Physical Therapy

## 2015-11-18 DIAGNOSIS — I6980 Unspecified sequelae of other cerebrovascular disease: Secondary | ICD-10-CM | POA: Insufficient documentation

## 2015-11-18 DIAGNOSIS — R29818 Other symptoms and signs involving the nervous system: Secondary | ICD-10-CM | POA: Diagnosis not present

## 2015-11-18 DIAGNOSIS — I69898 Other sequelae of other cerebrovascular disease: Secondary | ICD-10-CM | POA: Diagnosis not present

## 2015-11-18 DIAGNOSIS — I69951 Hemiplegia and hemiparesis following unspecified cerebrovascular disease affecting right dominant side: Secondary | ICD-10-CM | POA: Diagnosis not present

## 2015-11-18 DIAGNOSIS — R2681 Unsteadiness on feet: Secondary | ICD-10-CM | POA: Diagnosis not present

## 2015-11-18 DIAGNOSIS — R279 Unspecified lack of coordination: Secondary | ICD-10-CM | POA: Diagnosis not present

## 2015-11-18 DIAGNOSIS — R278 Other lack of coordination: Secondary | ICD-10-CM

## 2015-11-18 DIAGNOSIS — R269 Unspecified abnormalities of gait and mobility: Secondary | ICD-10-CM | POA: Diagnosis not present

## 2015-11-18 DIAGNOSIS — R531 Weakness: Secondary | ICD-10-CM | POA: Insufficient documentation

## 2015-11-18 DIAGNOSIS — IMO0002 Reserved for concepts with insufficient information to code with codable children: Secondary | ICD-10-CM

## 2015-11-18 DIAGNOSIS — R2689 Other abnormalities of gait and mobility: Secondary | ICD-10-CM

## 2015-11-18 NOTE — Therapy (Signed)
[facility] [address] [place], [name], [address] Phone: [phone]   Fax:  [phone]  Physical Therapy Treatment  Patient Details  Name: James Bell MRN: [id number] Date of Birth: 29-Jun-1931 Referring Provider: [name], MD  Encounter Date: 11/18/2015      PT End of Session - 11/18/15 1027    Visit Number 8   Number of Visits 17   Date for PT Re-Evaluation 12/17/15   Authorization Type Medicare G-Code & Progress Note every 10 visits   PT Start Time 0928   PT Stop Time 1010   PT Time Calculation (min) 42 min   Equipment Utilized During Treatment Gait belt   Activity Tolerance Patient tolerated treatment well   Behavior During Therapy [facility] for tasks assessed/performed      Past Medical History  Diagnosis Date  . HLD (hyperlipidemia)   . Restless leg syndrome     well controlled on mirapex  . OSA on CPAP     sleep study 01/2014 - severe, CPAP at 10, previously on nuvigil (Dr. [name])  . Type 2 diabetes, controlled, with neuropathy [facility]) 2011    [name] in [place]  . H/O hiatal hernia   . GERD (gastroesophageal reflux disease)     with sliding HH  . Migraines 03/11/12    now rare  . CVA (cerebral infarction) 03/07/12    slurred speech; right hemplegia, left handed - completed PT 07/2014 (17 sessions)  . Basal cell carcinoma of nose 2002    bridge of nose  . History of kidney problems 12/2010    lacerated left kidney after fall  . BPH with obstruction/lower urinary tract symptoms     failed flomax, uroxatral, myrbetriq - sees urology [name]) pending videourodynamics  . Depression     mild, on cymbalta per prior PCP  . Palpitations 01/2012    holter WNL, rare PAC/PVCs  . Erectile dysfunction     per urology (prostaglandin injection)  . Kidney cysts 02/2014    bilateral (renal US)  . CKD (chronic kidney disease) stage 3, GFR 30-59 ml/min 03/11/2012    Renal US 02/2014 - multiple kidney  cysts   . TIA (transient ischemic attack)   . Anemia   . Presbycusis of both ears 2016    rec amplification [facility])    Past Surgical History  Procedure Laterality Date  . Prostate surgery  02/2009    PVP (laser)  . Tonsillectomy and adenoidectomy  ~ 1945  . Hemorrhoid surgery  1991  . Cataract extraction w/ intraocular lens implant  11//2012 - 08/2011  . Inguinal hernia repair  1981; 2002    left, then repaired  . Skin cancer excision  2002    bridge of nose, BCC  . Mri brain  04/2010    WNL  . Esophagogastroduodenoscopy  2006    duodenitis, medual HH, Grade 2 reflux, mild gastritis  . Colonoscopy  03/2011    poor prep, unable to biopsy polyp in tic fold, rpt 3 mo [name])  . Abi  2007    WNL  . Urethral dilation  2014    tannenbaum  . Cystoscopy with urethral dilatation N/A 07/24/2013    Procedure: CYSTOSCOPY WITH URETHRAL DILATATION;  Surgeon: [name], MD;  Location: [facility];  Service: Urology;  Laterality: N/A;  . Transurethral incision of prostate N/A 07/24/2013    Procedure: TRANSURETHRAL INCISION OF THE PROSTATE (TUIP);  Surgeon: [name], MD;  Location: [facility];  Service: Urology;  Laterality: N/A;  . Esophagogastroduodenoscopy  07/2011    mild chronic gastritis  . Colonoscopy  06/2011    mult polyps adenomatous, severe diverticulosis, rpt 3 yrs [name])  . Carotid endarterectomy    . Cardiac catheterization  2004    normal; [facility]  . Cardiovascular stress test  02/2014    WNL, low risk scan, EF 68%    There were no vitals filed for this visit.  Visit Diagnosis:  Abnormality of gait  Unsteadiness  Balance problems  Weakness due to cerebrovascular accident  Hemiplegia affecting right side in right-dominant patient as late effect of cerebrovascular disease ([place])  Lack of coordination due to stroke  Decreased coordination      Subjective Assessment - 11/18/15 0931    Subjective No falls. He has been doing all of his exercises  breaking them up to every other day as PT advised.    Currently in Pain? No/denies            [facility] PT Assessment - 11/18/15 0932    Ambulation/Gait   Ambulation Distance (Feet) 500 Feet   High Level Balance   High Level Balance Activities Side stepping;Head turns;Negotitating around obstacles  head turns sideways, up/down & diagonals to scan   Berg Balance Test   Sit to Stand Able to stand without using hands and stabilize independently   Standing Unsupported Able to stand safely 2 minutes   Sitting with Back Unsupported but Feet Supported on Floor or Stool Able to sit safely and securely 2 minutes   Stand to Sit Sits safely with minimal use of hands   Transfers Able to transfer safely, minor use of hands   Standing Unsupported with Eyes Closed Able to stand 10 seconds safely   Standing Ubsupported with Feet Together Able to place feet together independently and stand for 1 minute with supervision   From Standing, Reach Forward with Outstretched Arm Can reach forward >12 cm safely (5")   From Standing Position, Pick up Object from Floor Able to pick up shoe, needs supervision   From Standing Position, Turn to Look Behind Over each Shoulder Turn sideways only but maintains balance   Turn 360 Degrees Needs close supervision or verbal cueing   Standing Unsupported, Alternately Place Feet on Step/Stool Able to complete >2 steps/needs minimal assist   Standing Unsupported, One Foot in Front Needs help to step but can hold 15 seconds   Standing on One Leg Tries to lift leg/unable to hold 3 seconds but remains standing independently   Total Score 39   Timed Up and Go Test   Normal TUG (seconds) 16.91  no device                     [facility] Adult PT Treatment/Exercise - 11/18/15 0932    Ambulation/Gait   Ambulation/Gait Yes   Ambulation/Gait Assistance 5: Supervision   Ambulation/Gait Assistance Details Tactile, manual cues on righting reactions/ balance with gait on grass &  with scanning   Assistive device Small based quad cane   Gait Pattern Step-through pattern;Decreased arm swing - right;Decreased step length - right;Decreased stance time - right;Decreased stride length;Decreased hip/knee flexion - right;Right hip hike;Right foot flat;Right flexed knee in stance;Ataxic;Narrow base of support;Trunk rotated posteriorly on right;Decreased trunk rotation   Ambulation Surface Indoor;Level;Outdoor;Paved;Gravel;Grass;Unlevel   Stairs --   Stairs Assistance --   Stair Management Technique --   Number of Stairs --   Ramp 4: Min assist  SBQC   Ramp Details (indicate cue type and reason) tactile cues on posture & balance reaction   Curb 4: Min assist  [facility]   Curb Details (indicate cue type and reason) verbal cues on weight shift   Standardized Balance Assessment   Standardized Balance Assessment Berg Balance Test;Timed Up and Go Test   High Level Balance   High Level Balance Comments tactile cues for balance reactions, maintain path & pace with scanning   Neuro Re-ed    Neuro Re-ed Details  Balance /coordination activities: at counter alternae tapping markers anterior, abduction, posterior; alternating LEs tapping 4 corners of square for limits of stability with UE support on mirror wall anterior to him with minA /tactile cues at pelvis for righting reactions; 4 square stepping with minA for balance reactions.              Balance Exercises - 11/18/15 0930    Balance Exercises: Standing   Gait with Head Turns Forward  on sidewalk outside scanning environment             PT Short Term Goals - 11/18/15 1028    PT SHORT TERM GOAL #1   Title Patient demonstrates understanding of updated HEP.  (Target Date: 11/19/2015)   Baseline MET 11/15/2015   Time 4   Period Weeks   Status Achieved   PT SHORT TERM GOAL #2   Title Berg Balance >/= 38/56  (Target Date: 11/19/2015)   Baseline MET 11/18/2015 [name] Balance 39/56   Time 4   Period Weeks   Status Achieved    PT SHORT TERM GOAL #3   Title Tiimed Up & Go without device <17sec with supervision.  (Target Date: 11/19/2015)   Baseline MET 11/18/2015 TUG without device 16.91sec with supervision.    Time 4   Period Weeks   Status Achieved   PT SHORT TERM GOAL #4   Title Patient ambulates around obstacles with SBQC 300' with supervision.  (Target Date: 11/19/2015)   Baseline MET 11/15/2015   Time 4   Period Weeks   Status Achieved   PT SHORT TERM GOAL #5   Title Patient negotiates ramps & curbs with [facility] with supervision.  (Target Date: 11/19/2015)   Baseline MET 11/15/2015   Time 4   Period Weeks   Status Achieved           PT Long Term Goals - 10/20/15 1015    PT LONG TERM GOAL #1   Title Patient verbalizes & demonstrates understanding of updated, ongoing fitness program / HEP. (Target Date: 12/17/2015)   Time 8   Period Weeks   Status New   PT LONG TERM GOAL #2   Title Berg Balance >45/56 to indicate lower fall risk.  (Target Date: 12/17/2015)   Time 8   Period Weeks   Status New   PT LONG TERM GOAL #3   Title Timed Up & Go without device <15 sec safely to indicate lower fall risk.  (Target Date: 12/17/2015)   Time 8   Period Weeks   Status New   PT LONG TERM GOAL #4   Title Patient able to scan environment while ambulating without fall risk.  (Target Date: 12/17/2015)   Time 8   Period Weeks   Status New   PT LONG TERM GOAL #5   Title Gait Velocity increases from 2.03 ft/sec to >2.2 ft/sec  (Target Date: 12/17/2015)   Time 8   Period Weeks   Status New  Plan - 11/18/15 1029    Clinical Impression Statement Patient was challenged by standing coordination, balance activities stepping and moving in all directions. Patient met remaining 2 of STGs so met all 5 STGs set at initial evaluation. Patient is progressing towards LTGs.    Pt will benefit from skilled therapeutic intervention in order to improve on the following deficits Abnormal gait;Decreased activity  tolerance;Decreased balance;Decreased knowledge of use of DME;Decreased mobility;Decreased strength;Impaired flexibility   Rehab Potential Good   PT Frequency 2x / week   PT Duration 8 weeks   PT Treatment/Interventions ADLs/Self Care Home Management;DME Instruction;Gait training;Stair training;Functional mobility training;Therapeutic activities;Therapeutic exercise;Balance training;Neuromuscular re-education;Patient/family education;Orthotic Fit/Training   PT Next Visit Plan Gait & balance working towards LTGs   Consulted and Agree with Plan of Care Patient        Problem List Patient Active Problem List   Diagnosis Date Noted  . Dry cough 10/25/2015  . Gait disturbance, post-stroke 10/05/2015  . Presbycusis of both ears   . Advanced care planning/counseling discussion 06/23/2015  . Skin rash 06/23/2015  . Impaired mobility 04/23/2015  . HTN (hypertension) 03/18/2015  . Chronic diastolic heart failure, NYHA class 1 ([place]) 03/18/2015  . Spastic hemiparesis affecting dominant side ([place]) 07/07/2014  . Left shoulder pain 03/16/2014  . Chest pressure 02/03/2014  . Medicare annual wellness visit, subsequent 11/06/2013  . Erectile dysfunction due to arterial insufficiency 02/05/2013  . Depression   . Palpitations 06/26/2012  . HLD (hyperlipidemia)   . Restless leg syndrome   . GERD (gastroesophageal reflux disease)   . BPH with obstruction/lower urinary tract symptoms   . History of CVA (cerebrovascular accident) 03/11/2012  . Hemiparesis affecting right side as late effect of stroke ([place]) 03/11/2012  . Type 2 diabetes, controlled, with neuropathy ([place]) 03/11/2012  . OSA (obstructive sleep apnea) 03/11/2012  . CKD (chronic kidney disease) stage 3, GFR 30-59 ml/min 03/11/2012  . Migraines 03/11/2012    Linette Gunderson PT, DPT 11/18/2015, 10:32 AM  [facility] [address] [place], [name], [address] Phone: [phone]    Fax:  [phone]  Name: James Bell MRN: [id number] Date of Birth: May 13, 1931

## 2015-11-19 ENCOUNTER — Other Ambulatory Visit: Payer: Self-pay | Admitting: *Deleted

## 2015-11-23 ENCOUNTER — Ambulatory Visit: Payer: Medicare Other | Admitting: Physical Therapy

## 2015-11-23 ENCOUNTER — Encounter: Payer: Self-pay | Admitting: Physical Therapy

## 2015-11-23 DIAGNOSIS — I69951 Hemiplegia and hemiparesis following unspecified cerebrovascular disease affecting right dominant side: Secondary | ICD-10-CM

## 2015-11-23 DIAGNOSIS — R278 Other lack of coordination: Secondary | ICD-10-CM

## 2015-11-23 DIAGNOSIS — R531 Weakness: Secondary | ICD-10-CM | POA: Diagnosis not present

## 2015-11-23 DIAGNOSIS — I69898 Other sequelae of other cerebrovascular disease: Secondary | ICD-10-CM | POA: Diagnosis not present

## 2015-11-23 DIAGNOSIS — R269 Unspecified abnormalities of gait and mobility: Secondary | ICD-10-CM

## 2015-11-23 DIAGNOSIS — IMO0002 Reserved for concepts with insufficient information to code with codable children: Secondary | ICD-10-CM

## 2015-11-23 DIAGNOSIS — R279 Unspecified lack of coordination: Secondary | ICD-10-CM

## 2015-11-23 DIAGNOSIS — R2689 Other abnormalities of gait and mobility: Secondary | ICD-10-CM

## 2015-11-23 DIAGNOSIS — R29818 Other symptoms and signs involving the nervous system: Secondary | ICD-10-CM | POA: Diagnosis not present

## 2015-11-23 DIAGNOSIS — R2681 Unsteadiness on feet: Secondary | ICD-10-CM | POA: Diagnosis not present

## 2015-11-23 NOTE — Therapy (Signed)
[facility] [address] [place], [name], [address] Phone: [phone]   Fax:  [phone]  Physical Therapy Treatment  Patient Details  Name: James Bell MRN: [id number] Date of Birth: 1931-05-15 Referring Provider: [name], MD  Encounter Date: 11/23/2015      PT End of Session - 11/23/15 1351    Visit Number 9   Number of Visits 17   Date for PT Re-Evaluation 12/17/15   Authorization Type Medicare G-Code & Progress Note every 10 visits   PT Start Time 0933   PT Stop Time 1020   PT Time Calculation (min) 47 min   Equipment Utilized During Treatment Gait belt   Activity Tolerance Patient tolerated treatment well   Behavior During Therapy [facility] for tasks assessed/performed      Past Medical History  Diagnosis Date  . HLD (hyperlipidemia)   . Restless leg syndrome     well controlled on mirapex  . OSA on CPAP     sleep study 01/2014 - severe, CPAP at 12, previously on nuvigil (Dr. [name])  . Type 2 diabetes, controlled, with neuropathy [facility]) 2011    [name] in [place]  . H/O hiatal hernia   . GERD (gastroesophageal reflux disease)     with sliding HH  . Migraines 03/11/12    now rare  . CVA (cerebral infarction) 03/07/12    slurred speech; right hemplegia, left handed - completed PT 07/2014 (17 sessions)  . Basal cell carcinoma of nose 2002    bridge of nose  . History of kidney problems 12/2010    lacerated left kidney after fall  . BPH with obstruction/lower urinary tract symptoms     failed flomax, uroxatral, myrbetriq - sees urology [name]) pending videourodynamics  . Depression     mild, on cymbalta per prior PCP  . Palpitations 01/2012    holter WNL, rare PAC/PVCs  . Erectile dysfunction     per urology (prostaglandin injection)  . Kidney cysts 02/2014    bilateral (renal US)  . CKD (chronic kidney disease) stage 3, GFR 30-59 ml/min 03/11/2012    Renal US 02/2014 - multiple kidney  cysts   . TIA (transient ischemic attack)   . Anemia   . Presbycusis of both ears 2016    rec amplification [facility])    Past Surgical History  Procedure Laterality Date  . Prostate surgery  02/2009    PVP (laser)  . Tonsillectomy and adenoidectomy  ~ 1945  . Hemorrhoid surgery  1991  . Cataract extraction w/ intraocular lens implant  11//2012 - 08/2011  . Inguinal hernia repair  1981; 2002    left, then repaired  . Skin cancer excision  2002    bridge of nose, BCC  . Mri brain  04/2010    WNL  . Esophagogastroduodenoscopy  2006    duodenitis, medual HH, Grade 2 reflux, mild gastritis  . Colonoscopy  03/2011    poor prep, unable to biopsy polyp in tic fold, rpt 3 mo [name])  . Abi  2007    WNL  . Urethral dilation  2014    tannenbaum  . Cystoscopy with urethral dilatation N/A 07/24/2013    Procedure: CYSTOSCOPY WITH URETHRAL DILATATION;  Surgeon: [name], MD;  [facility];  Service: Urology;  Laterality: N/A;  . Transurethral incision of prostate N/A 07/24/2013    Procedure: TRANSURETHRAL INCISION OF THE PROSTATE (TUIP);  Surgeon: [name], MD;  Location: [facility];  Service: Urology;  Laterality: N/A;  . Esophagogastroduodenoscopy  07/2011    mild chronic gastritis  . Colonoscopy  06/2011    mult polyps adenomatous, severe diverticulosis, rpt 3 yrs [name])  . Carotid endarterectomy    . Cardiac catheterization  2004    normal; [facility]  . Cardiovascular stress test  02/2014    WNL, low risk scan, EF 68%    There were no vitals filed for this visit.  Visit Diagnosis:  Abnormality of gait  Unsteadiness  Balance problems  Weakness due to cerebrovascular accident  Hemiplegia affecting right side in right-dominant patient as late effect of cerebrovascular disease ([place])  Lack of coordination due to stroke  Decreased coordination  Generalized weakness      Subjective Assessment - 11/23/15 0940    Subjective No falls. He has a lot of  exercises and uncertain how to priotize them. (PT requested to bring exercise sheets to next session)   Currently in Pain? No/denies     Therapeutic Exercise: Standing in parallel bars with mirror for visual feedback: alternate tapping cones with tactile cues for proper weight shift; standing crossways on foam beam with head turns and alternate stepping forward and backwards on beam.  Back extension stretch with posterior pelvic support on counter; with trunk rotation.  Pro-stretch- heelcord & hamstring stretch with tactile cues on movement. Back on door frame: overhead reach for stretch RUE requires LUE assist.                               PT Short Term Goals - 11/18/15 1028    PT SHORT TERM GOAL #1   Title Patient demonstrates understanding of updated HEP.  (Target Date: 11/19/2015)   Baseline MET 11/15/2015   Time 4   Period Weeks   Status Achieved   PT SHORT TERM GOAL #2   Title Berg Balance >/= 38/56  (Target Date: 11/19/2015)   Baseline MET 11/18/2015 [name] Balance 39/56   Time 4   Period Weeks   Status Achieved   PT SHORT TERM GOAL #3   Title Tiimed Up & Go without device <17sec with supervision.  (Target Date: 11/19/2015)   Baseline MET 11/18/2015 TUG without device 16.91sec with supervision.    Time 4   Period Weeks   Status Achieved   PT SHORT TERM GOAL #4   Title Patient ambulates around obstacles with SBQC 300' with supervision.  (Target Date: 11/19/2015)   Baseline MET 11/15/2015   Time 4   Period Weeks   Status Achieved   PT SHORT TERM GOAL #5   Title Patient negotiates ramps & curbs with [facility] with supervision.  (Target Date: 11/19/2015)   Baseline MET 11/15/2015   Time 4   Period Weeks   Status Achieved           PT Long Term Goals - 10/20/15 1015    PT LONG TERM GOAL #1   Title Patient verbalizes & demonstrates understanding of updated, ongoing fitness program / HEP. (Target Date: 12/17/2015)   Time 8   Period Weeks   Status New   PT LONG  TERM GOAL #2   Title Berg Balance >45/56 to indicate lower fall risk.  (Target Date: 12/17/2015)   Time 8   Period Weeks   Status New   PT LONG TERM GOAL #3   Title Timed Up & Go without device <15 sec safely to indicate lower fall  risk.  (Target Date: 12/17/2015)   Time 8   Period Weeks   Status New   PT LONG TERM GOAL #4   Title Patient able to scan environment while ambulating without fall risk.  (Target Date: 12/17/2015)   Time 8   Period Weeks   Status New   PT LONG TERM GOAL #5   Title Gait Velocity increases from 2.03 ft/sec to >2.2 ft/sec  (Target Date: 12/17/2015)   Time 8   Period Weeks   Status New               Plan - 11/23/15 1352    Clinical Impression Statement Patient improved right LE control with tactile cues with repetition. Patient appears to understand stretches to improve posture.     Pt will benefit from skilled therapeutic intervention in order to improve on the following deficits Abnormal gait;Decreased activity tolerance;Decreased balance;Decreased knowledge of use of DME;Decreased mobility;Decreased strength;Impaired flexibility   Rehab Potential Good   PT Frequency 2x / week   PT Duration 8 weeks   PT Treatment/Interventions ADLs/Self Care Home Management;DME Instruction;Gait training;Stair training;Functional mobility training;Therapeutic activities;Therapeutic exercise;Balance training;Neuromuscular re-education;Patient/family education;Orthotic Fit/Training   PT Next Visit Plan Do G-code with Berg & TUG, Gait & balance working towards [employer] and Agree with Plan of Care Patient        Problem List Patient Active Problem List   Diagnosis Date Noted  . Dry cough 10/25/2015  . Gait disturbance, post-stroke 10/05/2015  . Presbycusis of both ears   . Advanced care planning/counseling discussion 06/23/2015  . Skin rash 06/23/2015  . Impaired mobility 04/23/2015  . HTN (hypertension) 03/18/2015  . Chronic diastolic heart failure, NYHA  class 1 ([place]) 03/18/2015  . Spastic hemiparesis affecting dominant side ([place]) 07/07/2014  . Left shoulder pain 03/16/2014  . Chest pressure 02/03/2014  . Medicare annual wellness visit, subsequent 11/06/2013  . Erectile dysfunction due to arterial insufficiency 02/05/2013  . Depression   . Palpitations 06/26/2012  . HLD (hyperlipidemia)   . Restless leg syndrome   . GERD (gastroesophageal reflux disease)   . BPH with obstruction/lower urinary tract symptoms   . History of CVA (cerebrovascular accident) 03/11/2012  . Hemiparesis affecting right side as late effect of stroke ([place]) 03/11/2012  . Type 2 diabetes, controlled, with neuropathy ([place]) 03/11/2012  . OSA (obstructive sleep apnea) 03/11/2012  . CKD (chronic kidney disease) stage 3, GFR 30-59 ml/min 03/11/2012  . Migraines 03/11/2012    Cree Napoli PT, DPT 11/23/2015, 2:02 PM  [facility] [address] [place], [name], [address] Phone: [phone]   Fax:  [phone]  Name: James Bell MRN: [id number] Date of Birth: November 14, 1930

## 2015-11-24 DIAGNOSIS — N5201 Erectile dysfunction due to arterial insufficiency: Secondary | ICD-10-CM | POA: Diagnosis not present

## 2015-11-24 DIAGNOSIS — R351 Nocturia: Secondary | ICD-10-CM | POA: Diagnosis not present

## 2015-11-24 DIAGNOSIS — E291 Testicular hypofunction: Secondary | ICD-10-CM | POA: Diagnosis not present

## 2015-11-24 DIAGNOSIS — Z0000 Encounter for general adult medical examination without abnormal findings: Secondary | ICD-10-CM | POA: Diagnosis not present

## 2015-11-24 DIAGNOSIS — N401 Enlarged prostate with lower urinary tract symptoms: Secondary | ICD-10-CM | POA: Diagnosis not present

## 2015-11-25 ENCOUNTER — Ambulatory Visit: Payer: Medicare Other | Admitting: Physical Therapy

## 2015-11-25 ENCOUNTER — Encounter: Payer: Self-pay | Admitting: Physical Therapy

## 2015-11-25 DIAGNOSIS — R2681 Unsteadiness on feet: Secondary | ICD-10-CM | POA: Diagnosis not present

## 2015-11-25 DIAGNOSIS — R29818 Other symptoms and signs involving the nervous system: Secondary | ICD-10-CM | POA: Diagnosis not present

## 2015-11-25 DIAGNOSIS — R2689 Other abnormalities of gait and mobility: Secondary | ICD-10-CM

## 2015-11-25 DIAGNOSIS — R531 Weakness: Secondary | ICD-10-CM

## 2015-11-25 DIAGNOSIS — R278 Other lack of coordination: Secondary | ICD-10-CM

## 2015-11-25 DIAGNOSIS — I69951 Hemiplegia and hemiparesis following unspecified cerebrovascular disease affecting right dominant side: Secondary | ICD-10-CM | POA: Diagnosis not present

## 2015-11-25 DIAGNOSIS — I69898 Other sequelae of other cerebrovascular disease: Secondary | ICD-10-CM | POA: Diagnosis not present

## 2015-11-25 DIAGNOSIS — R279 Unspecified lack of coordination: Secondary | ICD-10-CM

## 2015-11-25 DIAGNOSIS — R269 Unspecified abnormalities of gait and mobility: Secondary | ICD-10-CM

## 2015-11-25 DIAGNOSIS — IMO0002 Reserved for concepts with insufficient information to code with codable children: Secondary | ICD-10-CM

## 2015-11-25 NOTE — Patient Instructions (Addendum)
[email or web address]  Endurance: NuStep at house up to 12 minutes, Walk pushing folded rollator ~1/3 mile (12-15 minutes)  Strength: Bed: Bridging, clam shell hip abduction, straight leg raises,  raise up on toes Chair:sit to/from stand, hold chair kicks into hip extension & hip abduction, Standing with step: alternate tapping step or cones, step up & over step using right leg, step up sideways onto step,  Gym/ fitness room: leg press  Balance: Step exercises above have balance component Corner exercises: shift pelvis forward/backward & right/left, eyes open feet together move head, feet apart eyes closed move head, stand on cushion feet apart eyes open move head, Try to stand with one foot in front of other, try to stand on one leg  Flexibility:  Chair sitting / hamstring stretch, heelcord stretch with towel, back to counter lean back with trunk, rotate trunk to stretch back,  Door frame: reach left arm up & back, use left arm to help right arm trying to reach up & back Bed: trunk rotation  Keep walking in mornings with rollator followed by NuStep after breakfast. Try to increase time as able.  Do Bed exercises in evenings or mornings when already near the bed. Add raise up on toes while brace is off. Do stretches throughout your day. If seated in a chair, do hamstring & heel cord (can use your cane in place of towel). If near counter, work on back extension. If leaving bathroom, use doorframe. Chair: strength, do Monday, Wednesday, Friday Step near counter: do Monday, Wednesday, Friday Corner exercises for balance: do Tuesday, Thursday, Saturday  Hip Backward Kick   Using a chair for balance, keep legs shoulder width apart and toes pointed for- ward. Slowly extend one leg back, keeping knee straight. Do not lean forward. Repeat with other leg. Repeat _10___ times. Do _1-2___ sessions per day.  http://[email or web address].exer.us/340   Copyright  VHI. All rights reserved.   Hip Side  Kick   Holding a chair for balance, keep legs shoulder width apart and toes pointed forward. Swing a leg out to side, keeping knee straight. Do not lean. Repeat using other leg. Repeat ____ times. Do ____ sessions per day.  http://[email or web address].exer.us/342   Copyright  VHI. All rights reserved.   ANKLE: Plantarflexion, Bilateral - Standing   Stand with upright posture. Raise heels up as high as possible. _10__ reps per set, _1__ sets per day, _1-2__ times per day. Hold onto a support.  Copyright  VHI. All rights reserved.   Bridging   Slowly raise buttocks from floor, keeping stomach tight. Repeat _10___ times per set. Do _1___ sets per session. Do _1-2___ sessions per day.  http://orth.exer.us/1096   Straight Leg Raise   Tighten stomach and slowly raise locked right leg __6-8__ inches from floor. Repeat _10___ times per set. Do ___1_ sets per session. Do _1-2___ sessions per day.  http://orth.exer.us/1102   Copyright  VHI. All rights reserved.    Functional Quadriceps: Sit to Stand   Sit on edge of chair, feet flat on floor. Stand upright, extending knees fully. Repeat __10__ times per set. Do __1__ sets per session. Do _1-2___ sessions per day.  http://orth.exer.us/734   Copyright  VHI. All rights reserved.

## 2015-11-25 NOTE — Therapy (Signed)
[facility] [address] [place], [name], [address] Phone: [phone]   Fax:  [phone]  Physical Therapy Treatment  Patient Details  Name: James Bell MRN: [id number] Date of Birth: 03/19/1931 Referring Provider: [name], MD  Encounter Date: 11/25/2015      PT End of Session - 11/25/15 1020    Visit Number 10   Number of Visits 17   Date for PT Re-Evaluation 12/17/15   Authorization Type Medicare G-Code & Progress Note every 10 visits   PT Start Time 0930   PT Stop Time 1020   PT Time Calculation (min) 50 min   Equipment Utilized During Treatment Gait belt   Activity Tolerance Patient tolerated treatment well   Behavior During Therapy [facility] for tasks assessed/performed      Past Medical History  Diagnosis Date  . HLD (hyperlipidemia)   . Restless leg syndrome     well controlled on mirapex  . OSA on CPAP     sleep study 01/2014 - severe, CPAP at 10, previously on nuvigil (Dr. [name])  . Type 2 diabetes, controlled, with neuropathy [facility]) 2011    [name] in [place]  . H/O hiatal hernia   . GERD (gastroesophageal reflux disease)     with sliding HH  . Migraines 03/11/12    now rare  . CVA (cerebral infarction) 03/07/12    slurred speech; right hemplegia, left handed - completed PT 07/2014 (17 sessions)  . Basal cell carcinoma of nose 2002    bridge of nose  . History of kidney problems 12/2010    lacerated left kidney after fall  . BPH with obstruction/lower urinary tract symptoms     failed flomax, uroxatral, myrbetriq - sees urology [name]) pending videourodynamics  . Depression     mild, on cymbalta per prior PCP  . Palpitations 01/2012    holter WNL, rare PAC/PVCs  . Erectile dysfunction     per urology (prostaglandin injection)  . Kidney cysts 02/2014    bilateral (renal US)  . CKD (chronic kidney disease) stage 3, GFR 30-59 ml/min 03/11/2012    Renal US 02/2014 - multiple  kidney cysts   . TIA (transient ischemic attack)   . Anemia   . Presbycusis of both ears 2016    rec amplification [facility])    Past Surgical History  Procedure Laterality Date  . Prostate surgery  02/2009    PVP (laser)  . Tonsillectomy and adenoidectomy  ~ 1945  . Hemorrhoid surgery  1991  . Cataract extraction w/ intraocular lens implant  11//2012 - 08/2011  . Inguinal hernia repair  1981; 2002    left, then repaired  . Skin cancer excision  2002    bridge of nose, BCC  . Mri brain  04/2010    WNL  . Esophagogastroduodenoscopy  2006    duodenitis, medual HH, Grade 2 reflux, mild gastritis  . Colonoscopy  03/2011    poor prep, unable to biopsy polyp in tic fold, rpt 3 mo [name])  . Abi  2007    WNL  . Urethral dilation  2014    tannenbaum  . Cystoscopy with urethral dilatation N/A 07/24/2013    Procedure: CYSTOSCOPY WITH URETHRAL DILATATION;  Surgeon: [name], MD;  Location: [facility]S;  Service: Urology;  Laterality: N/A;  . Transurethral incision of prostate N/A 07/24/2013    Procedure: TRANSURETHRAL INCISION OF THE PROSTATE (TUIP);  Surgeon: [name], MD;  Location: [facility];  Service: Urology;  Laterality: N/A;  . Esophagogastroduodenoscopy  07/2011    mild chronic gastritis  . Colonoscopy  06/2011    mult polyps adenomatous, severe diverticulosis, rpt 3 yrs [name])  . Carotid endarterectomy    . Cardiac catheterization  2004    normal; [place] [facility]  . Cardiovascular stress test  02/2014    WNL, low risk scan, EF 68%    There were no vitals filed for this visit.  Visit Diagnosis:  Abnormality of gait  Unsteadiness  Balance problems  Weakness due to cerebrovascular accident  Lack of coordination due to stroke  Decreased coordination  Generalized weakness      Subjective Assessment - 11/25/15 0939    Subjective He fell trying to carry plant into home but denies injuries.    Patient Stated Goals He wants to work on his balance & strength  including core & RLE   Currently in Pain? No/denies            [facility] PT Assessment - 11/25/15 0930    Berg Balance Test   Sit to Stand Able to stand without using hands and stabilize independently   Standing Unsupported Able to stand safely 2 minutes   Sitting with Back Unsupported but Feet Supported on Floor or Stool Able to sit safely and securely 2 minutes   Stand to Sit Sits safely with minimal use of hands   Transfers Able to transfer safely, minor use of hands   Standing Unsupported with Eyes Closed Able to stand 10 seconds safely   Standing Ubsupported with Feet Together Able to place feet together independently and stand for 1 minute with supervision   From Standing, Reach Forward with Outstretched Arm Can reach forward >12 cm safely (5")   From Standing Position, Pick up Object from Floor Able to pick up shoe, needs supervision   From Standing Position, Turn to Look Behind Over each Shoulder Looks behind one side only/other side shows less weight shift   Turn 360 Degrees Able to turn 360 degrees safely but slowly   Standing Unsupported, Alternately Place Feet on Step/Stool Able to complete >2 steps/needs minimal assist   Standing Unsupported, One Foot in Front Able to take small step independently and hold 30 seconds   Standing on One Leg Tries to lift leg/unable to hold 3 seconds but remains standing independently   Total Score 42   Timed Up and Go Test   Normal TUG (seconds) 15.97                     [facility] Adult PT Treatment/Exercise - 11/25/15 0930    Standardized Balance Assessment   Standardized Balance Assessment Berg Balance Test;Timed Up and Go Test       Therapeutic Exercise: PT reviewed exercises from all 3 episodes of care with patient bringing in instruction sheets.  [email or web address]  Endurance: NuStep at house up to 12 minutes, Walk pushing folded rollator ~1/3 mile (12-15 minutes)  Strength: Bed: Bridging, clam shell hip abduction,  straight leg raises,  raise up on toes Chair:sit to/from stand, hold chair kicks into hip extension & hip abduction, Standing with step: alternate tapping step or cones, step up & over step using right leg, step up sideways onto step,  Gym/ fitness room: leg press  Balance: Step exercises above have balance component Corner exercises: shift pelvis forward/backward & right/left, eyes open feet together move head, feet apart eyes closed move head, stand on cushion feet apart eyes  open move head, Try to stand with one foot in front of other, try to stand on one leg  Flexibility:  Chair sitting / hamstring stretch, heelcord stretch with towel, back to counter lean back with trunk, rotate trunk to stretch back,  Door frame: reach left arm up & back, use left arm to help right arm trying to reach up & back Bed: trunk rotation  Keep walking in mornings with rollator followed by NuStep after breakfast. Try to increase time as able.  Do Bed exercises in evenings or mornings when already near the bed. Add raise up on toes while brace is off. Do stretches throughout your day. If seated in a chair, do hamstring & heel cord (can use your cane in place of towel). If near counter, work on back extension. If leaving bathroom, use doorframe. Chair: strength, do Monday, Wednesday, Friday Step near counter: do Monday, Wednesday, Friday Corner exercises for balance: do Tuesday, Thursday, Saturday  Hip Backward Kick   Using a chair for balance, keep legs shoulder width apart and toes pointed for- ward. Slowly extend one leg back, keeping knee straight. Do not lean forward. Repeat with other leg. Repeat _10___ times. Do _1-2___ sessions per day.  http://[email or web address].exer.us/340   Copyright  VHI. All rights reserved.   Hip Side Kick   Holding a chair for balance, keep legs shoulder width apart and toes pointed forward. Swing a leg out to side, keeping knee straight. Do not lean. Repeat using other leg. Repeat  ____ times. Do ____ sessions per day.  http://[email or web address].exer.us/342   Copyright  VHI. All rights reserved.   ANKLE: Plantarflexion, Bilateral - Standing   Stand with upright posture. Raise heels up as high as possible. _10__ reps per set, _1__ sets per day, _1-2__ times per day. Hold onto a support.  Copyright  VHI. All rights reserved.   Bridging   Slowly raise buttocks from floor, keeping stomach tight. Repeat _10___ times per set. Do _1___ sets per session. Do _1-2___ sessions per day.  http://orth.exer.us/1096   Straight Leg Raise   Tighten stomach and slowly raise locked right leg __6-8__ inches from floor. Repeat _10___ times per set. Do ___1_ sets per session. Do _1-2___ sessions per day.  http://orth.exer.us/1102   Copyright  VHI. All rights reserved.    Functional Quadriceps: Sit to Stand   Sit on edge of chair, feet flat on floor. Stand upright, extending knees fully. Repeat __10__ times per set. Do __1__ sets per session. Do _1-2___ sessions per day.  http://orth.exer.us/734   Copyright  VHI. All rights reserved.             PT Education - 11/25/15 0930    Education provided Yes   Education Details PT reviewed exercises and instructed in frequency and days for each typed of exercise. Using rollator to move objects with weight or akward size.   Person(s) Educated Patient   Methods Explanation;Verbal cues;Handout   Comprehension Verbalized understanding;Need further instruction          PT Short Term Goals - 11/18/15 1028    PT SHORT TERM GOAL #1   Title Patient demonstrates understanding of updated HEP.  (Target Date: 11/19/2015)   Baseline MET 11/15/2015   Time 4   Period Weeks   Status Achieved   PT SHORT TERM GOAL #2   Title Berg Balance >/= 38/56  (Target Date: 11/19/2015)   Baseline MET 11/18/2015 Berg Balance 39/56   Time 4   Period Weeks   Status  Achieved   PT SHORT TERM GOAL #3   Title Tiimed Up & Go without device <17sec with  supervision.  (Target Date: 11/19/2015)   Baseline MET 11/18/2015 TUG without device 16.91sec with supervision.    Time 4   Period Weeks   Status Achieved   PT SHORT TERM GOAL #4   Title Patient ambulates around obstacles with SBQC 300' with supervision.  (Target Date: 11/19/2015)   Baseline MET 11/15/2015   Time 4   Period Weeks   Status Achieved   PT SHORT TERM GOAL #5   Title Patient negotiates ramps & curbs with [facility] with supervision.  (Target Date: 11/19/2015)   Baseline MET 11/15/2015   Time 4   Period Weeks   Status Achieved           PT Long Term Goals - 08-Dec-2015 1015    PT LONG TERM GOAL #1   Title Patient verbalizes & demonstrates understanding of updated, ongoing fitness program / HEP. (Target Date: 12/17/2015)   Time 8   Period Weeks   Status On-going   PT LONG TERM GOAL #2   Title Berg Balance >45/56 to indicate lower fall risk.  (Target Date: 12/17/2015)   Time 8   Period Weeks   Status On-going   PT LONG TERM GOAL #3   Title Timed Up & Go without device <15 sec safely to indicate lower fall risk.  (Target Date: 12/17/2015)   Time 8   Period Weeks   Status On-going   PT LONG TERM GOAL #4   Title Patient able to scan environment while ambulating without fall risk.  (Target Date: 12/17/2015)   Time 8   Period Weeks   Status On-going   PT LONG TERM GOAL #5   Title Gait Velocity increases from 2.03 ft/sec to >2.2 ft/sec  (Target Date: 12/17/2015)   Time 8   Period Weeks   Status On-going               Plan - 08-Dec-2015 1015    Clinical Impression Statement Patient's most recent fall appears as he was trying to move a plant inside his home & he reports he tripped over the threshold. He verbalizes understanding of best option to ask someone to help him or use rollator walker to move object. Patient appears to understand breakdown overview of exercises from all 3 episodes of care that he continues to do.    Pt will benefit from skilled therapeutic intervention in  order to improve on the following deficits Abnormal gait;Decreased activity tolerance;Decreased balance;Decreased knowledge of use of DME;Decreased mobility;Decreased strength;Impaired flexibility   Rehab Potential Good   PT Frequency 2x / week   PT Duration 8 weeks   PT Treatment/Interventions ADLs/Self Care Home Management;DME Instruction;Gait training;Stair training;Functional mobility training;Therapeutic activities;Therapeutic exercise;Balance training;Neuromuscular re-education;Patient/family education;Orthotic Fit/Training   PT Next Visit Plan Review HEP overview. Update tandem & single leg stance exercises.  Gait & balance working towards [employer] and Agree with Plan of Care Patient          G-Codes - December 08, 2015 1015    Functional Assessment Tool Used Berg Balance 42/56; Timed Up-Go 15.97sec   Functional Limitation Mobility: Walking and moving around   Mobility: Walking and Moving Around Current Status [phone]) At least 60 percent but less than 80 percent impaired, limited or restricted   Mobility: Walking and Moving Around Goal Status (U0454) At least 40 percent but less than 60 percent impaired, limited or restricted  Problem List Patient Active Problem List   Diagnosis Date Noted  . Dry cough 10/25/2015  . Gait disturbance, post-stroke 10/05/2015  . Presbycusis of both ears   . Advanced care planning/counseling discussion 06/23/2015  . Skin rash 06/23/2015  . Impaired mobility 04/23/2015  . HTN (hypertension) 03/18/2015  . Chronic diastolic heart failure, NYHA class 1 ([place]) 03/18/2015  . Spastic hemiparesis affecting dominant side ([place]) 07/07/2014  . Left shoulder pain 03/16/2014  . Chest pressure 02/03/2014  . Medicare annual wellness visit, subsequent 11/06/2013  . Erectile dysfunction due to arterial insufficiency 02/05/2013  . Depression   . Palpitations 06/26/2012  . HLD (hyperlipidemia)   . Restless leg syndrome   . GERD (gastroesophageal reflux  disease)   . BPH with obstruction/lower urinary tract symptoms   . History of CVA (cerebrovascular accident) 03/11/2012  . Hemiparesis affecting right side as late effect of stroke ([place]) 03/11/2012  . Type 2 diabetes, controlled, with neuropathy ([place]) 03/11/2012  . OSA (obstructive sleep apnea) 03/11/2012  . CKD (chronic kidney disease) stage 3, GFR 30-59 ml/min 03/11/2012  . Migraines 03/11/2012    Amorette Charrette PT, DPT 11/25/2015, 4:26 PM  [facility] [address] [place], [name], [address] Phone: [phone]   Fax:  [phone]  Name: James Bell MRN: [id number] Date of Birth: Mar 12, 1931

## 2015-11-30 ENCOUNTER — Encounter: Payer: Self-pay | Admitting: Physical Therapy

## 2015-11-30 ENCOUNTER — Ambulatory Visit: Payer: Medicare Other | Admitting: Physical Therapy

## 2015-11-30 DIAGNOSIS — R531 Weakness: Secondary | ICD-10-CM

## 2015-11-30 DIAGNOSIS — R269 Unspecified abnormalities of gait and mobility: Secondary | ICD-10-CM | POA: Diagnosis not present

## 2015-11-30 DIAGNOSIS — R2681 Unsteadiness on feet: Secondary | ICD-10-CM

## 2015-11-30 DIAGNOSIS — R2689 Other abnormalities of gait and mobility: Secondary | ICD-10-CM

## 2015-11-30 DIAGNOSIS — I69951 Hemiplegia and hemiparesis following unspecified cerebrovascular disease affecting right dominant side: Secondary | ICD-10-CM

## 2015-11-30 DIAGNOSIS — I69898 Other sequelae of other cerebrovascular disease: Secondary | ICD-10-CM | POA: Diagnosis not present

## 2015-11-30 DIAGNOSIS — R279 Unspecified lack of coordination: Secondary | ICD-10-CM

## 2015-11-30 DIAGNOSIS — IMO0002 Reserved for concepts with insufficient information to code with codable children: Secondary | ICD-10-CM

## 2015-11-30 DIAGNOSIS — R29818 Other symptoms and signs involving the nervous system: Secondary | ICD-10-CM | POA: Diagnosis not present

## 2015-11-30 DIAGNOSIS — R278 Other lack of coordination: Secondary | ICD-10-CM

## 2015-11-30 NOTE — Patient Instructions (Signed)
Endurance: NuStep at house up to 12 minutes, Walk pushing folded rollator ~1/3 mile (12-15 minutes)  Strength: Bed: Bridging, clam shell hip abduction, straight leg raises, raise up on toes Chair:sit to/from stand, hold chair kicks into hip extension & hip abduction, Standing with step: alternate tapping step or cones, step up & over step using right leg, step up sideways onto step,  Gym/ fitness room: leg press  Balance: Step exercises above have balance component Corner exercises: shift pelvis forward/backward & right/left, eyes open feet together move head, feet apart eyes closed move head, stand on cushion feet apart eyes open move head, Try to stand with one foot in front of other, try to stand on one leg  Flexibility:  Chair sitting / hamstring stretch, heelcord stretch with towel, back to counter lean back with trunk, rotate trunk to stretch back,  Door frame: reach left arm up & back, use left arm to help right arm trying to reach up & back Bed: trunk rotation  Keep walking in mornings with rollator followed by NuStep after breakfast. Try to increase time as able.  Do Bed exercises in evenings or mornings when already near the bed. Add raise up on toes while brace is off. Do stretches throughout your day. If seated in a chair, do hamstring & heel cord (can use your cane in place of towel). If near counter, work on back extension. If leaving bathroom, use doorframe. Chair: strength, do Monday, Wednesday, Friday Step near counter: do Monday, Wednesday, Friday Corner exercises for balance: do Tuesday, Thursday, Saturday  Hip Backward Kick   Using a chair for balance, keep legs shoulder width apart and toes pointed for- ward. Slowly extend one leg back, keeping knee straight. Do not lean forward. Repeat with other leg. Repeat _10___ times. Do _1-2___ sessions per day.  http://[email or web address].exer.us/340   Copyright  VHI. All rights reserved.   Hip Side Kick   Holding a chair for  balance, keep legs shoulder width apart and toes pointed forward. Swing a leg out to side, keeping knee straight. Do not lean. Repeat using other leg. Repeat ____ times. Do ____ sessions per day.  http://[email or web address].exer.us/342   Copyright  VHI. All rights reserved.   ANKLE: Plantarflexion, Bilateral - Standing   Stand with upright posture. Raise heels up as high as possible. _10__ reps per set, _1__ sets per day, _1-2__ times per day. Hold onto a support.  Copyright  VHI. All rights reserved.   Bridging   Slowly raise buttocks from floor, keeping stomach tight. Repeat _10___ times per set. Do _1___ sets per session. Do _1-2___ sessions per day.  http://orth.exer.us/1096   Straight Leg Raise   Tighten stomach and slowly raise locked right leg __6-8__ inches from floor. Repeat _10___ times per set. Do ___1_ sets per session. Do _1-2___ sessions per day.  http://orth.exer.us/1102   Copyright  VHI. All rights reserved.    Functional Quadriceps: Sit to Stand   Sit on edge of chair, feet flat on floor. Stand upright, extending knees fully. Repeat __10__ times per set. Do __1__ sets per session. Do _1-2___ sessions per day.  http://orth.exer.us/734   Copyright  VHI. All rights reserved.

## 2015-12-01 NOTE — Therapy (Signed)
[facility] [address] [place], [name], [address] Phone: [phone]   Fax:  [phone]  Physical Therapy Treatment  Patient Details  Name: James Bell MRN: [id number] Date of Birth: Oct 14, 1930 Referring Provider: [name], MD  Encounter Date: 11/30/2015      PT End of Session - 11/30/15 1015    Visit Number 11   Number of Visits 17   Date for PT Re-Evaluation 12/17/15   Authorization Type Medicare G-Code & Progress Note every 10 visits   PT Start Time 0932   PT Stop Time 1014   PT Time Calculation (min) 42 min   Equipment Utilized During Treatment Gait belt   Activity Tolerance Patient tolerated treatment well   Behavior During Therapy [facility] for tasks assessed/performed      Past Medical History  Diagnosis Date  . HLD (hyperlipidemia)   . Restless leg syndrome     well controlled on mirapex  . OSA on CPAP     sleep study 01/2014 - severe, CPAP at 10, previously on nuvigil (Dr. [name])  . Type 2 diabetes, controlled, with neuropathy [facility]) 2011    [name] in [place]  . H/O hiatal hernia   . GERD (gastroesophageal reflux disease)     with sliding HH  . Migraines 03/11/12    now rare  . CVA (cerebral infarction) 03/07/12    slurred speech; right hemplegia, left handed - completed PT 07/2014 (17 sessions)  . Basal cell carcinoma of nose 2002    bridge of nose  . History of kidney problems 12/2010    lacerated left kidney after fall  . BPH with obstruction/lower urinary tract symptoms     failed flomax, uroxatral, myrbetriq - sees urology [name]) pending videourodynamics  . Depression     mild, on cymbalta per prior PCP  . Palpitations 01/2012    holter WNL, rare PAC/PVCs  . Erectile dysfunction     per urology (prostaglandin injection)  . Kidney cysts 02/2014    bilateral (renal US)  . CKD (chronic kidney disease) stage 3, GFR 30-59 ml/min 03/11/2012    Renal US 02/2014 - multiple  kidney cysts   . TIA (transient ischemic attack)   . Anemia   . Presbycusis of both ears 2016    rec amplification [facility])    Past Surgical History  Procedure Laterality Date  . Prostate surgery  02/2009    PVP (laser)  . Tonsillectomy and adenoidectomy  ~ 1945  . Hemorrhoid surgery  1991  . Cataract extraction w/ intraocular lens implant  11//2012 - 08/2011  . Inguinal hernia repair  1981; 2002    left, then repaired  . Skin cancer excision  2002    bridge of nose, BCC  . Mri brain  04/2010    WNL  . Esophagogastroduodenoscopy  2006    duodenitis, medual HH, Grade 2 reflux, mild gastritis  . Colonoscopy  03/2011    poor prep, unable to biopsy polyp in tic fold, rpt 3 mo [name])  . Abi  2007    WNL  . Urethral dilation  2014    tannenbaum  . Cystoscopy with urethral dilatation N/A 07/24/2013    Procedure: CYSTOSCOPY WITH URETHRAL DILATATION;  Surgeon: [name], MD;  Location: [facility];  Service: Urology;  Laterality: N/A;  . Transurethral incision of prostate N/A 07/24/2013    Procedure: TRANSURETHRAL INCISION OF THE PROSTATE (TUIP);  Surgeon: [name], MD;  Location: [facility];  Service: Urology;  Laterality: N/A;  . Esophagogastroduodenoscopy  07/2011    mild chronic gastritis  . Colonoscopy  06/2011    mult polyps adenomatous, severe diverticulosis, rpt 3 yrs [name])  . Carotid endarterectomy    . Cardiac catheterization  2004    normal; [facility]  . Cardiovascular stress test  02/2014    WNL, low risk scan, EF 68%    There were no vitals filed for this visit.  Visit Diagnosis:  Abnormality of gait  Unsteadiness  Balance problems  Weakness due to cerebrovascular accident  Lack of coordination due to stroke  Decreased coordination  Generalized weakness  Hemiplegia affecting right side in right-dominant patient as late effect of cerebrovascular disease ([place])      Subjective Assessment - 11/30/15 0937    Subjective he did not get email  for HEP. No falls.    Patient Stated Goals He wants to work on his balance & strength including core & RLE   Currently in Pain? Yes   Pain Score 6    Pain Location Shoulder   Pain Orientation Left   Pain Descriptors / Indicators Aching   Pain Type Acute pain   Pain Onset 1 to 4 weeks ago   Pain Frequency Intermittent   Aggravating Factors  moving arm   Pain Relieving Factors not moving arm     Therapeutic Exercise: PT printed exercise overview created last session and verbally reviewed. Patient verbalized understanding and appreciation for organizing exercises. PT palpated left shoulder with pain along biceps tendon at anterior shoulder. PT instructed in correlation to head forward & rounded shoulders posture to shoulder pain. PT instructed with demo, verbal & tactile cues on cervical retraction and shoulder retraction with depression and return demo understanding.  Gait Training: PT adjusted SBQC to facilitate more upright posture. Patient ambulated 200' with cues on vision to facilitate upright posture. Patient negotiated ramp & curb with [facility] with supervision & cues on technique.                            PT Education - 11/30/15 0930    Education provided Yes   Education Details PT printed & resent email for overview of his exercises. Upper body posture and shoulder pain relationship.   Person(s) Educated Patient   Methods Explanation;Demonstration;Tactile cues;Verbal cues;Handout   Comprehension Verbalized understanding;Returned demonstration;Verbal cues required;Tactile cues required;Need further instruction          PT Short Term Goals - 11/18/15 1028    PT SHORT TERM GOAL #1   Title Patient demonstrates understanding of updated HEP.  (Target Date: 11/19/2015)   Baseline MET 11/15/2015   Time 4   Period Weeks   Status Achieved   PT SHORT TERM GOAL #2   Title Berg Balance >/= 38/56  (Target Date: 11/19/2015)   Baseline MET 11/18/2015 [name] Balance  39/56   Time 4   Period Weeks   Status Achieved   PT SHORT TERM GOAL #3   Title Tiimed Up & Go without device <17sec with supervision.  (Target Date: 11/19/2015)   Baseline MET 11/18/2015 TUG without device 16.91sec with supervision.    Time 4   Period Weeks   Status Achieved   PT SHORT TERM GOAL #4   Title Patient ambulates around obstacles with SBQC 300' with supervision.  (Target Date: 11/19/2015)   Baseline MET 11/15/2015   Time 4   Period Weeks   Status Achieved  PT SHORT TERM GOAL #5   Title Patient negotiates ramps & curbs with [facility] with supervision.  (Target Date: 11/19/2015)   Baseline MET 11/15/2015   Time 4   Period Weeks   Status Achieved           PT Long Term Goals - 11/25/15 1015    PT LONG TERM GOAL #1   Title Patient verbalizes & demonstrates understanding of updated, ongoing fitness program / HEP. (Target Date: 12/17/2015)   Time 8   Period Weeks   Status On-going   PT LONG TERM GOAL #2   Title Berg Balance >45/56 to indicate lower fall risk.  (Target Date: 12/17/2015)   Time 8   Period Weeks   Status On-going   PT LONG TERM GOAL #3   Title Timed Up & Go without device <15 sec safely to indicate lower fall risk.  (Target Date: 12/17/2015)   Time 8   Period Weeks   Status On-going   PT LONG TERM GOAL #4   Title Patient able to scan environment while ambulating without fall risk.  (Target Date: 12/17/2015)   Time 8   Period Weeks   Status On-going   PT LONG TERM GOAL #5   Title Gait Velocity increases from 2.03 ft/sec to >2.2 ft/sec  (Target Date: 12/17/2015)   Time 8   Period Weeks   Status On-going               Plan - 11/30/15 1015    Clinical Impression Statement Patient verbalized a better understanding of HEP / fitness plan. Patient is able to correct upper body posture with verbal & tactile cues but returns to head forward & rounded shoulder posture when not concentrating. Patient's left shoulder appears to have tendonitis from repetitive use  in poor posture position.    Pt will benefit from skilled therapeutic intervention in order to improve on the following deficits Abnormal gait;Decreased activity tolerance;Decreased balance;Decreased knowledge of use of DME;Decreased mobility;Decreased strength;Impaired flexibility   Rehab Potential Good   PT Frequency 2x / week   PT Duration 8 weeks   PT Treatment/Interventions ADLs/Self Care Home Management;DME Instruction;Gait training;Stair training;Functional mobility training;Therapeutic activities;Therapeutic exercise;Balance training;Neuromuscular re-education;Patient/family education;Orthotic Fit/Training   PT Next Visit Plan Review upper posture. Update tandem & single leg stance exercises.  Gait & balance working towards [employer] and Agree with Plan of Care Patient        Problem List Patient Active Problem List   Diagnosis Date Noted  . Dry cough 10/25/2015  . Gait disturbance, post-stroke 10/05/2015  . Presbycusis of both ears   . Advanced care planning/counseling discussion 06/23/2015  . Skin rash 06/23/2015  . Impaired mobility 04/23/2015  . HTN (hypertension) 03/18/2015  . Chronic diastolic heart failure, NYHA class 1 ([place]) 03/18/2015  . Spastic hemiparesis affecting dominant side ([place]) 07/07/2014  . Left shoulder pain 03/16/2014  . Chest pressure 02/03/2014  . Medicare annual wellness visit, subsequent 11/06/2013  . Erectile dysfunction due to arterial insufficiency 02/05/2013  . Depression   . Palpitations 06/26/2012  . HLD (hyperlipidemia)   . Restless leg syndrome   . GERD (gastroesophageal reflux disease)   . BPH with obstruction/lower urinary tract symptoms   . History of CVA (cerebrovascular accident) 03/11/2012  . Hemiparesis affecting right side as late effect of stroke ([place]) 03/11/2012  . Type 2 diabetes, controlled, with neuropathy ([place]) 03/11/2012  . OSA (obstructive sleep apnea) 03/11/2012  . CKD (chronic kidney disease) stage 3,  GFR 30-59  ml/min 03/11/2012  . Migraines 03/11/2012    Torian Thoennes PT, DPT 12/01/2015, 7:12 AM  [facility] [address] [place], [name], [address] Phone: [phone]   Fax:  [phone]  Name: James Bell MRN: [id number] Date of Birth: September 25, 1930

## 2015-12-02 ENCOUNTER — Encounter: Payer: Self-pay | Admitting: Physical Therapy

## 2015-12-02 ENCOUNTER — Ambulatory Visit: Payer: Medicare Other | Admitting: Physical Therapy

## 2015-12-02 DIAGNOSIS — I69898 Other sequelae of other cerebrovascular disease: Secondary | ICD-10-CM | POA: Diagnosis not present

## 2015-12-02 DIAGNOSIS — I69951 Hemiplegia and hemiparesis following unspecified cerebrovascular disease affecting right dominant side: Secondary | ICD-10-CM | POA: Diagnosis not present

## 2015-12-02 DIAGNOSIS — R531 Weakness: Secondary | ICD-10-CM

## 2015-12-02 DIAGNOSIS — R269 Unspecified abnormalities of gait and mobility: Secondary | ICD-10-CM | POA: Diagnosis not present

## 2015-12-02 DIAGNOSIS — IMO0002 Reserved for concepts with insufficient information to code with codable children: Secondary | ICD-10-CM

## 2015-12-02 DIAGNOSIS — R2681 Unsteadiness on feet: Secondary | ICD-10-CM

## 2015-12-02 DIAGNOSIS — R29818 Other symptoms and signs involving the nervous system: Secondary | ICD-10-CM | POA: Diagnosis not present

## 2015-12-02 DIAGNOSIS — R2689 Other abnormalities of gait and mobility: Secondary | ICD-10-CM

## 2015-12-02 NOTE — Therapy (Signed)
[facility] [address] [place], [name], [address] Phone: [phone]   Fax:  [phone]  Physical Therapy Treatment  Patient Details  Name: James Bell MRN: [id number] Date of Birth: [birth year] Referring Provider: [name], MD  Encounter Date: 12/02/2015      PT End of Session - 12/02/15 1308    Visit Number 12   Number of Visits 17   Date for PT Re-Evaluation 12/17/15   Authorization Type Medicare G-Code & Progress Note every 10 visits   PT Start Time 0930   PT Stop Time 1015   PT Time Calculation (min) 45 min   Equipment Utilized During Treatment Gait belt   Activity Tolerance Patient tolerated treatment well   Behavior During Therapy [facility] for tasks assessed/performed      Past Medical History  Diagnosis Date  . HLD (hyperlipidemia)   . Restless leg syndrome     well controlled on mirapex  . OSA on CPAP     sleep study 01/2014 - severe, CPAP at 10, previously on nuvigil (Dr. [name])  . Type 2 diabetes, controlled, with neuropathy [facility]) 2011    [name] in [place]  . H/O hiatal hernia   . GERD (gastroesophageal reflux disease)     with sliding HH  . Migraines 03/11/12    now rare  . CVA (cerebral infarction) 03/07/12    slurred speech; right hemplegia, left handed - completed PT 07/2014 (17 sessions)  . Basal cell carcinoma of nose 2002    bridge of nose  . History of kidney problems 12/2010    lacerated left kidney after fall  . BPH with obstruction/lower urinary tract symptoms     failed flomax, uroxatral, myrbetriq - sees urology [name]) pending videourodynamics  . Depression     mild, on cymbalta per prior PCP  . Palpitations 01/2012    holter WNL, rare PAC/PVCs  . Erectile dysfunction     per urology (prostaglandin injection)  . Kidney cysts 02/2014    bilateral (renal US)  . CKD (chronic kidney disease) stage 3, GFR 30-59 ml/min 03/11/2012    Renal US 02/2014 - multiple  kidney cysts   . TIA (transient ischemic attack)   . Anemia   . Presbycusis of both ears 2016    rec amplification [facility])    Past Surgical History  Procedure Laterality Date  . Prostate surgery  02/2009    PVP (laser)  . Tonsillectomy and adenoidectomy  ~ 1945  . Hemorrhoid surgery  1991  . Cataract extraction w/ intraocular lens implant  11//2012 - 08/2011  . Inguinal hernia repair  1981; 2002    left, then repaired  . Skin cancer excision  2002    bridge of nose, BCC  . Mri brain  04/2010    WNL  . Esophagogastroduodenoscopy  2006    duodenitis, medual HH, Grade 2 reflux, mild gastritis  . Colonoscopy  03/2011    poor prep, unable to biopsy polyp in tic fold, rpt 3 mo [name])  . Abi  2007    WNL  . Urethral dilation  2014    tannenbaum  . Cystoscopy with urethral dilatation N/A 07/24/2013    Procedure: CYSTOSCOPY WITH URETHRAL DILATATION;  Surgeon: [name], MD;  [facility]: [facility];  Service: Urology;  Laterality: N/A;  . Transurethral incision of prostate N/A 07/24/2013    Procedure: TRANSURETHRAL INCISION OF THE PROSTATE (TUIP);  Surgeon: [name], MD;  Location: [facility];  Service: Urology;  Laterality: N/A;  . Esophagogastroduodenoscopy  07/2011    mild chronic gastritis  . Colonoscopy  06/2011    mult polyps adenomatous, severe diverticulosis, rpt 3 yrs [name])  . Carotid endarterectomy    . Cardiac catheterization  2004    normal; [facility]  . Cardiovascular stress test  02/2014    WNL, low risk scan, EF 68%    There were no vitals filed for this visit.  Visit Diagnosis:  Abnormality of gait  Unsteadiness  Balance problems  Weakness due to cerebrovascular accident  Lack of coordination due to stroke  Generalized weakness      Subjective Assessment - 12/02/15 0933    Subjective No falls. He feels exercise program is now more organized and helpful.    Patient Stated Goals He wants to work on his balance & strength including core &  RLE   Currently in Pain? Yes   Pain Score 2    Pain Location Shoulder   Pain Orientation Left   Pain Descriptors / Indicators Aching   Pain Type Acute pain   Pain Onset 1 to 4 weeks ago   Pain Frequency Intermittent   Aggravating Factors  moving arm   Pain Relieving Factors medication     Gait Training: Patient ambulated with single point cane with quad tip working on increasing speed to "cross a street" by quicker step & longer steps with visual target to maintain upright posture.  Patient ambulated with single point cane scanning environment with cues for maintaining path & pace.  Therapeutic Exercise: See patient education / instructions. Standing in parallel bars with mirror for visual feedback, tactile, verbal & demo for isolated, controlled motions: Knee flexion (LLE on 2" block to make RLE slightly shorter) 20 reps with cues for controlling rotation Alternate hip abduction with cues on rotation control & not leaning trunk Alternate hip extension with cues on rotation control & not flexing trunk.                             PT Education - 12/02/15 0930    Education provided Yes   Education Details posture & scapula retraction  / shoulder depression  / cervical retraction   Person(s) Educated Patient   Methods Explanation;Demonstration;Tactile cues;Verbal cues;Handout   Comprehension Verbalized understanding;Returned demonstration;Verbal cues required;Tactile cues required;Need further instruction          PT Short Term Goals - 11/18/15 1028    PT SHORT TERM GOAL #1   Title Patient demonstrates understanding of updated HEP.  (Target Date: 11/19/2015)   Baseline MET 11/15/2015   Time 4   Period Weeks   Status Achieved   PT SHORT TERM GOAL #2   Title Berg Balance >/= 38/56  (Target Date: 11/19/2015)   Baseline MET 11/18/2015 [name] Balance 39/56   Time 4   Period Weeks   Status Achieved   PT SHORT TERM GOAL #3   Title Tiimed Up & Go without device  <17sec with supervision.  (Target Date: 11/19/2015)   Baseline MET 11/18/2015 TUG without device 16.91sec with supervision.    Time 4   Period Weeks   Status Achieved   PT SHORT TERM GOAL #4   Title Patient ambulates around obstacles with SBQC 300' with supervision.  (Target Date: 11/19/2015)   Baseline MET 11/15/2015   Time 4   Period Weeks   Status Achieved   PT SHORT TERM GOAL #5  Title Patient negotiates ramps & curbs with [facility] with supervision.  (Target Date: 11/19/2015)   Baseline MET 11/15/2015   Time 4   Period Weeks   Status Achieved           PT Long Term Goals - 11/25/15 1015    PT LONG TERM GOAL #1   Title Patient verbalizes & demonstrates understanding of updated, ongoing fitness program / HEP. (Target Date: 12/17/2015)   Time 8   Period Weeks   Status On-going   PT LONG TERM GOAL #2   Title Berg Balance >45/56 to indicate lower fall risk.  (Target Date: 12/17/2015)   Time 8   Period Weeks   Status On-going   PT LONG TERM GOAL #3   Title Timed Up & Go without device <15 sec safely to indicate lower fall risk.  (Target Date: 12/17/2015)   Time 8   Period Weeks   Status On-going   PT LONG TERM GOAL #4   Title Patient able to scan environment while ambulating without fall risk.  (Target Date: 12/17/2015)   Time 8   Period Weeks   Status On-going   PT LONG TERM GOAL #5   Title Gait Velocity increases from 2.03 ft/sec to >2.2 ft/sec  (Target Date: 12/17/2015)   Time 8   Period Weeks   Status On-going               Plan - 12/02/15 1309    Clinical Impression Statement Patient improved LE isolated movements with visual, verbal & tactile cues for activities. Patient was able to maintain balance with scanning during gait better today than previous session.    Pt will benefit from skilled therapeutic intervention in order to improve on the following deficits Abnormal gait;Decreased activity tolerance;Decreased balance;Decreased knowledge of use of DME;Decreased  mobility;Decreased strength;Impaired flexibility   Rehab Potential Good   PT Frequency 2x / week   PT Duration 8 weeks   PT Treatment/Interventions ADLs/Self Care Home Management;DME Instruction;Gait training;Stair training;Functional mobility training;Therapeutic activities;Therapeutic exercise;Balance training;Neuromuscular re-education;Patient/family education;Orthotic Fit/Training   PT Next Visit Plan Update tandem & single leg stance exercises.  Gait & balance working towards [employer] and Agree with Plan of Care Patient        Problem List Patient Active Problem List   Diagnosis Date Noted  . Dry cough 10/25/2015  . Gait disturbance, post-stroke 10/05/2015  . Presbycusis of both ears   . Advanced care planning/counseling discussion 06/23/2015  . Skin rash 06/23/2015  . Impaired mobility 04/23/2015  . HTN (hypertension) 03/18/2015  . Chronic diastolic heart failure, NYHA class 1 ([place]) 03/18/2015  . Spastic hemiparesis affecting dominant side ([place]) 07/07/2014  . Left shoulder pain 03/16/2014  . Chest pressure 02/03/2014  . Medicare annual wellness visit, subsequent 11/06/2013  . Erectile dysfunction due to arterial insufficiency 02/05/2013  . Depression   . Palpitations 06/26/2012  . HLD (hyperlipidemia)   . Restless leg syndrome   . GERD (gastroesophageal reflux disease)   . BPH with obstruction/lower urinary tract symptoms   . History of CVA (cerebrovascular accident) 03/11/2012  . Hemiparesis affecting right side as late effect of stroke ([place]) 03/11/2012  . Type 2 diabetes, controlled, with neuropathy ([place]) 03/11/2012  . OSA (obstructive sleep apnea) 03/11/2012  . CKD (chronic kidney disease) stage 3, GFR 30-59 ml/min 03/11/2012  . Migraines 03/11/2012    Jamaurion Slemmer PT, DPT 12/02/2015, 1:13 PM  [place], [name],  [address] Phone: [phone]   Fax:   [phone]  Name: James Bell MRN: [id number] Date of Birth: [birth year]

## 2015-12-02 NOTE — Patient Instructions (Signed)
Posture - Sitting    Sit upright, head facing forward. Try using a roll to support lower back. Keep shoulders relaxed, and avoid rounded back. Keep hips level with knees. Avoid crossing legs for long periods.   Copyright  VHI. All rights reserved.  Posture Awareness    Stand and check posture: Jut chin, pull back to comfortable position. Tilt pelvis forward, back; be sure back is not swayed. Roll from heels to balls of feet, then distribute your weight evenly. Picture a line through spine pulling you erect. Focus on breathing. Good Posture = Better Breathing. Check multiple times per day.  http://gt2.exer.us/873   Copyright  VHI. All rights reserved.  Posture Tips    -All exercises are to be done with proper posture (PP). -Sit as erect as possible. Sit on ishuim bones not tailbone. -Frontal View: Chin over Sternum, Sternum over Navel. -Side View: Ear over Shoulder, Shoulder over Hip.   Copyright  VHI. All rights reserved.  Axial Extension (Chin Tuck)    Press back of head into towel or pillow placed behind your head against wall. tuck chin in and lengthen back of neck. Hold __5__ seconds while counting out loud. Repeat _10_ times.   http://gt2.exer.us/449   Copyright  VHI. All rights reserved.  Scapular Retraction (Standing)    With arms at sides, pinch shoulder blades together. Repeat _10___ times   http://orth.exer.us/944   Copyright  VHI. All rights reserved.  Depression (Active)    Start with erect posture. Lower shoulders or push elbows down into armrests of chair. Hold __5__ seconds.  BREATH =  Copyright  VHI. All rights reserved.

## 2015-12-07 ENCOUNTER — Ambulatory Visit: Payer: Medicare Other | Admitting: Physical Therapy

## 2015-12-07 DIAGNOSIS — R269 Unspecified abnormalities of gait and mobility: Secondary | ICD-10-CM

## 2015-12-07 DIAGNOSIS — R2689 Other abnormalities of gait and mobility: Secondary | ICD-10-CM

## 2015-12-07 DIAGNOSIS — R29818 Other symptoms and signs involving the nervous system: Secondary | ICD-10-CM | POA: Diagnosis not present

## 2015-12-07 DIAGNOSIS — R278 Other lack of coordination: Secondary | ICD-10-CM

## 2015-12-07 DIAGNOSIS — I69898 Other sequelae of other cerebrovascular disease: Secondary | ICD-10-CM | POA: Diagnosis not present

## 2015-12-07 DIAGNOSIS — R279 Unspecified lack of coordination: Secondary | ICD-10-CM

## 2015-12-07 DIAGNOSIS — R2681 Unsteadiness on feet: Secondary | ICD-10-CM

## 2015-12-07 DIAGNOSIS — R531 Weakness: Secondary | ICD-10-CM

## 2015-12-07 DIAGNOSIS — IMO0002 Reserved for concepts with insufficient information to code with codable children: Secondary | ICD-10-CM

## 2015-12-07 DIAGNOSIS — I69951 Hemiplegia and hemiparesis following unspecified cerebrovascular disease affecting right dominant side: Secondary | ICD-10-CM | POA: Diagnosis not present

## 2015-12-07 NOTE — Patient Instructions (Signed)
Tandem Stance    Face counter. Right foot in front of left, heel touching toe both feet "straight ahead". Stand on Foot Triangle of Support with both feet. Balance in this position count to 50 and stop count when you have to touch. Do with left foot in front of right but right foot needs to be straight so may have to be out to side a little.  Copyright  VHI. All rights reserved.     Place one foot on ledge of cabinet under sink when standing at sink for balance. Count to 10. Repeat with other leg. Alternate legs. Do 3 times on each leg.    Copyright  VHI. All rights reserved.  Lunge: Backward on Diagonal With Rotation    Face counter. Make sure both feet especially right are facing straight ahead. Step right leg out to side turning foot. Hold for 3 count. Step back facing counter. Step left leg out to side turning foot. Kasandra Knudsen may need to be to left side. Count to 3.  Do _10__ times, each leg,  http://ss.exer.us/166   Copyright  VHI. All rights reserved.

## 2015-12-07 NOTE — Therapy (Signed)
[place] [address] [place], [name], [address] Phone: [phone]   Fax:  [phone]  Physical Therapy Treatment  Patient Details  Name: James Bell MRN: [id number] Date of Birth: Jul 31, 1931 Referring Provider: [name], MD  Encounter Date: 12/07/2015      PT End of Session - 12/07/15 1350    Visit Number 13   Number of Visits 17   Date for PT Re-Evaluation 12/17/15   Authorization Type Medicare G-Code & Progress Note every 10 visits   PT Start Time 0934   PT Stop Time 1015   PT Time Calculation (min) 41 min   Equipment Utilized During Treatment Gait belt   Activity Tolerance Patient tolerated treatment well   Behavior During Therapy [facility] for tasks assessed/performed      Past Medical History  Diagnosis Date  . HLD (hyperlipidemia)   . Restless leg syndrome     well controlled on mirapex  . OSA on CPAP     sleep study 01/2014 - severe, CPAP at 10, previously on nuvigil (Dr. [name])  . Type 2 diabetes, controlled, with neuropathy [facility]) 2011    [name] in [place]  . H/O hiatal hernia   . GERD (gastroesophageal reflux disease)     with sliding HH  . Migraines 03/11/12    now rare  . CVA (cerebral infarction) 03/07/12    slurred speech; right hemplegia, left handed - completed PT 07/2014 (17 sessions)  . Basal cell carcinoma of nose 2002    bridge of nose  . History of kidney problems 12/2010    lacerated left kidney after fall  . BPH with obstruction/lower urinary tract symptoms     failed flomax, uroxatral, myrbetriq - sees urology [name]) pending videourodynamics  . Depression     mild, on cymbalta per prior PCP  . Palpitations 01/2012    holter WNL, rare PAC/PVCs  . Erectile dysfunction     per urology (prostaglandin injection)  . Kidney cysts 02/2014    bilateral (renal US)  . CKD (chronic kidney disease) stage 3, GFR 30-59 ml/min 03/11/2012    Renal US 02/2014 - multiple  kidney cysts   . TIA (transient ischemic attack)   . Anemia   . Presbycusis of both ears 2016    rec amplification [facility])    Past Surgical History  Procedure Laterality Date  . Prostate surgery  02/2009    PVP (laser)  . Tonsillectomy and adenoidectomy  ~ 1945  . Hemorrhoid surgery  1991  . Cataract extraction w/ intraocular lens implant  11//2012 - 08/2011  . Inguinal hernia repair  1981; 2002    left, then repaired  . Skin cancer excision  2002    bridge of nose, BCC  . Mri brain  04/2010    WNL  . Esophagogastroduodenoscopy  2006    duodenitis, medual HH, Grade 2 reflux, mild gastritis  . Colonoscopy  03/2011    poor prep, unable to biopsy polyp in tic fold, rpt 3 mo [name])  . Abi  2007    WNL  . Urethral dilation  2014    tannenbaum  . Cystoscopy with urethral dilatation N/A 07/24/2013    Procedure: CYSTOSCOPY WITH URETHRAL DILATATION;  Surgeon: [name], MD;  Location: [facility];  Service: Urology;  Laterality: N/A;  . Transurethral incision of prostate N/A 07/24/2013    Procedure: TRANSURETHRAL INCISION OF THE PROSTATE (TUIP);  Surgeon: [name], MD;  Location: [facility];  Service: Urology;  Laterality: N/A;  . Esophagogastroduodenoscopy  07/2011    mild chronic gastritis  . Colonoscopy  06/2011    mult polyps adenomatous, severe diverticulosis, rpt 3 yrs [name])  . Carotid endarterectomy    . Cardiac catheterization  2004    normal; [facility]  . Cardiovascular stress test  02/2014    WNL, low risk scan, EF 68%    There were no vitals filed for this visit.  Visit Diagnosis:  Abnormality of gait  Unsteadiness  Balance problems  Weakness due to cerebrovascular accident  Lack of coordination due to stroke  Generalized weakness  Decreased coordination      Subjective Assessment - 12/07/15 0936    Subjective (p) No falls. He has not been walking with folded rollator because it seems to bother his shoulder.   Patient Stated Goals (p) He  wants to work on his balance & strength including core & RLE   Currently in Pain? (p) Yes   Pain Score (p) 2   in last week worst 4-5/10, best 0/10   Pain Location (p) Shoulder   Pain Orientation (p) Left   Pain Descriptors / Indicators (p) Aching   Pain Type (p) Acute pain   Pain Onset (p) More than a month ago   Pain Frequency (p) Intermittent   Aggravating Factors  (p) moving arm    Pain Relieving Factors (p) good posture     Neuromuscular Re-education for balance with manual / tactile, verbal & visual/demo cues. Static tandem stance at counter top with intermittent UE support, right LE back required modification laterally for offset position.  Modified single leg stance with forefoot in lower cabinet with intermittent UE support. Standing with right LE not externally rotated, stepping laterally with external rotation at hip & pelvis with counter support while stepping, hold 3 seconds without UE support, return to starting position with UE support while stepping, hold 3 seconds, repeat with other LE. Patient needed minA to stabilize in each position initially and was able to progress to supervision. Standing static with neutral rotation of RLE using SBQC and stepping off to right & left with ambulation 5 steps. Repeated both directions 10 times to work on direction changes without balance loss.                              PT Education - 12/07/15 1349    Education provided Yes   Education Details tandem, modified single leg stance & hip rotation / stepping to sides   Person(s) Educated Patient   Methods Explanation;Demonstration;Tactile cues;Verbal cues;Handout   Comprehension Verbalized understanding;Returned demonstration;Verbal cues required;Tactile cues required;Need further instruction          PT Short Term Goals - 11/18/15 1028    PT SHORT TERM GOAL #1   Title Patient demonstrates understanding of updated HEP.  (Target Date: 11/19/2015)   Baseline  MET 11/15/2015   Time 4   Period Weeks   Status Achieved   PT SHORT TERM GOAL #2   Title Berg Balance >/= 38/56  (Target Date: 11/19/2015)   Baseline MET 11/18/2015 [name] Balance 39/56   Time 4   Period Weeks   Status Achieved   PT SHORT TERM GOAL #3   Title Tiimed Up & Go without device <17sec with supervision.  (Target Date: 11/19/2015)   Baseline MET 11/18/2015 TUG without device 16.91sec with supervision.    Time 4   Period  Weeks   Status Achieved   PT SHORT TERM GOAL #4   Title Patient ambulates around obstacles with SBQC 300' with supervision.  (Target Date: 11/19/2015)   Baseline MET 11/15/2015   Time 4   Period Weeks   Status Achieved   PT SHORT TERM GOAL #5   Title Patient negotiates ramps & curbs with [facility] with supervision.  (Target Date: 11/19/2015)   Baseline MET 11/15/2015   Time 4   Period Weeks   Status Achieved           PT Long Term Goals - 11/25/15 1015    PT LONG TERM GOAL #1   Title Patient verbalizes & demonstrates understanding of updated, ongoing fitness program / HEP. (Target Date: 12/17/2015)   Time 8   Period Weeks   Status On-going   PT LONG TERM GOAL #2   Title Berg Balance >45/56 to indicate lower fall risk.  (Target Date: 12/17/2015)   Time 8   Period Weeks   Status On-going   PT LONG TERM GOAL #3   Title Timed Up & Go without device <15 sec safely to indicate lower fall risk.  (Target Date: 12/17/2015)   Time 8   Period Weeks   Status On-going   PT LONG TERM GOAL #4   Title Patient able to scan environment while ambulating without fall risk.  (Target Date: 12/17/2015)   Time 8   Period Weeks   Status On-going   PT LONG TERM GOAL #5   Title Gait Velocity increases from 2.03 ft/sec to >2.2 ft/sec  (Target Date: 12/17/2015)   Time 8   Period Weeks   Status On-going               Plan - 12/07/15 1351    Clinical Impression Statement Patient improved right LE control with hip rotation exercise with instruction & repetition for activities.  Patient reports he hired someone to assist him twice per week to help with balance exercises.    Pt will benefit from skilled therapeutic intervention in order to improve on the following deficits Abnormal gait;Decreased activity tolerance;Decreased balance;Decreased knowledge of use of DME;Decreased mobility;Decreased strength;Impaired flexibility   Rehab Potential Good   PT Frequency 2x / week   PT Duration 8 weeks   PT Treatment/Interventions ADLs/Self Care Home Management;DME Instruction;Gait training;Stair training;Functional mobility training;Therapeutic activities;Therapeutic exercise;Balance training;Neuromuscular re-education;Patient/family education;Orthotic Fit/Training   PT Next Visit Plan Gait & balance working towards LTGs   Consulted and Agree with Plan of Care Patient        Problem List Patient Active Problem List   Diagnosis Date Noted  . Dry cough 10/25/2015  . Gait disturbance, post-stroke 10/05/2015  . Presbycusis of both ears   . Advanced care planning/counseling discussion 06/23/2015  . Skin rash 06/23/2015  . Impaired mobility 04/23/2015  . HTN (hypertension) 03/18/2015  . Chronic diastolic heart failure, NYHA class 1 ([place]) 03/18/2015  . Spastic hemiparesis affecting dominant side ([place]) 07/07/2014  . Left shoulder pain 03/16/2014  . Chest pressure 02/03/2014  . Medicare annual wellness visit, subsequent 11/06/2013  . Erectile dysfunction due to arterial insufficiency 02/05/2013  . Depression   . Palpitations 06/26/2012  . HLD (hyperlipidemia)   . Restless leg syndrome   . GERD (gastroesophageal reflux disease)   . BPH with obstruction/lower urinary tract symptoms   . History of CVA (cerebrovascular accident) 03/11/2012  . Hemiparesis affecting right side as late effect of stroke ([place]) 03/11/2012  . Type 2 diabetes, controlled, with  neuropathy ([place]) 03/11/2012  . OSA (obstructive sleep apnea) 03/11/2012  . CKD (chronic kidney disease) stage 3, GFR 30-59  ml/min 03/11/2012  . Migraines 03/11/2012    Laneta Guerin PT, DPT 12/07/2015, 4:24 PM  [place] [address] [place], [name], [address] Phone: [phone]   Fax:  [phone]  Name: SHAH INSLEY MRN: [id number] Date of Birth: 1931/05/26

## 2015-12-09 ENCOUNTER — Ambulatory Visit: Payer: Medicare Other | Admitting: Physical Therapy

## 2015-12-09 ENCOUNTER — Encounter: Payer: Self-pay | Admitting: Physical Therapy

## 2015-12-09 DIAGNOSIS — R29818 Other symptoms and signs involving the nervous system: Secondary | ICD-10-CM | POA: Diagnosis not present

## 2015-12-09 DIAGNOSIS — IMO0002 Reserved for concepts with insufficient information to code with codable children: Secondary | ICD-10-CM

## 2015-12-09 DIAGNOSIS — I69951 Hemiplegia and hemiparesis following unspecified cerebrovascular disease affecting right dominant side: Secondary | ICD-10-CM | POA: Diagnosis not present

## 2015-12-09 DIAGNOSIS — R531 Weakness: Secondary | ICD-10-CM | POA: Diagnosis not present

## 2015-12-09 DIAGNOSIS — R269 Unspecified abnormalities of gait and mobility: Secondary | ICD-10-CM | POA: Diagnosis not present

## 2015-12-09 DIAGNOSIS — R2681 Unsteadiness on feet: Secondary | ICD-10-CM | POA: Diagnosis not present

## 2015-12-09 DIAGNOSIS — I69898 Other sequelae of other cerebrovascular disease: Secondary | ICD-10-CM | POA: Diagnosis not present

## 2015-12-09 DIAGNOSIS — R2689 Other abnormalities of gait and mobility: Secondary | ICD-10-CM

## 2015-12-10 NOTE — Therapy (Signed)
[place] [address] [place], [name], [address] Phone: [phone]   Fax:  [phone]  Physical Therapy Treatment  Patient Details  Name: James Bell MRN: [id number] Date of Birth: 1930/11/20 Referring Provider: [name], MD  Encounter Date: 12/09/2015      PT End of Session - 12/09/15 1015    Visit Number 14   Number of Visits 17   Date for PT Re-Evaluation 12/17/15   Authorization Type Medicare G-Code & Progress Note every 10 visits   PT Start Time 0934   PT Stop Time 1015   PT Time Calculation (min) 41 min   Equipment Utilized During Treatment Gait belt   Activity Tolerance Patient tolerated treatment well   Behavior During Therapy [facility] for tasks assessed/performed      Past Medical History  Diagnosis Date  . HLD (hyperlipidemia)   . Restless leg syndrome     well controlled on mirapex  . OSA on CPAP     sleep study 01/2014 - severe, CPAP at 12, previously on nuvigil (Dr. [name])  . Type 2 diabetes, controlled, with neuropathy [facility]) 2011    [name] in [place]  . H/O hiatal hernia   . GERD (gastroesophageal reflux disease)     with sliding HH  . Migraines 03/11/12    now rare  . CVA (cerebral infarction) 03/07/12    slurred speech; right hemplegia, left handed - completed PT 07/2014 (17 sessions)  . Basal cell carcinoma of nose 2002    bridge of nose  . History of kidney problems 12/2010    lacerated left kidney after fall  . BPH with obstruction/lower urinary tract symptoms     failed flomax, uroxatral, myrbetriq - sees urology [name]) pending videourodynamics  . Depression     mild, on cymbalta per prior PCP  . Palpitations 01/2012    holter WNL, rare PAC/PVCs  . Erectile dysfunction     per urology (prostaglandin injection)  . Kidney cysts 02/2014    bilateral (renal US)  . CKD (chronic kidney disease) stage 3, GFR 30-59 ml/min 03/11/2012    Renal US 02/2014 - multiple  kidney cysts   . TIA (transient ischemic attack)   . Anemia   . Presbycusis of both ears 2016    rec amplification [facility])    Past Surgical History  Procedure Laterality Date  . Prostate surgery  02/2009    PVP (laser)  . Tonsillectomy and adenoidectomy  ~ 1945  . Hemorrhoid surgery  1991  . Cataract extraction w/ intraocular lens implant  11//2012 - 08/2011  . Inguinal hernia repair  1981; 2002    left, then repaired  . Skin cancer excision  2002    bridge of nose, BCC  . Mri brain  04/2010    WNL  . Esophagogastroduodenoscopy  2006    duodenitis, medual HH, Grade 2 reflux, mild gastritis  . Colonoscopy  03/2011    poor prep, unable to biopsy polyp in tic fold, rpt 3 mo [name])  . Abi  2007    WNL  . Urethral dilation  2014    tannenbaum  . Cystoscopy with urethral dilatation N/A 07/24/2013    Procedure: CYSTOSCOPY WITH URETHRAL DILATATION;  Surgeon: [name], MD;  Location: [facility];  Service: Urology;  Laterality: N/A;  . Transurethral incision of prostate N/A 07/24/2013    Procedure: TRANSURETHRAL INCISION OF THE PROSTATE (TUIP);  Surgeon: [name], MD;  Location: [facility];  Service: Urology;  Laterality: N/A;  . Esophagogastroduodenoscopy  07/2011    mild chronic gastritis  . Colonoscopy  06/2011    mult polyps adenomatous, severe diverticulosis, rpt 3 yrs [name])  . Carotid endarterectomy    . Cardiac catheterization  2004    normal; [facility]  . Cardiovascular stress test  02/2014    WNL, low risk scan, EF 68%    There were no vitals filed for this visit.  Visit Diagnosis:  Abnormality of gait  Unsteadiness  Balance problems  Weakness due to cerebrovascular accident  Lack of coordination due to stroke  Generalized weakness      Subjective Assessment - 12/09/15 0939    Subjective (p) No falls. He brought his rollator walker to assess shoulder pain.    Patient Stated Goals (p) He wants to work on his balance & strength including core  & RLE   Currently in Pain? (p) Yes   Pain Score (p) 1    Pain Location (p) Shoulder   Pain Orientation (p) Left   Pain Descriptors / Indicators (p) Aching   Pain Type (p) Acute pain   Pain Onset (p) 1 to 4 weeks ago   Pain Frequency (p) Intermittent     Neuromuscular Re-Education working on balance with tactile & verbal cues: Tandem stance with each LE in front total of 1 minute and modified single limb stance with forefoot on lower cabinet shelf up to 10seconds with minA standing on right LE and supervision on left LE. Alternate turning LLE & RLE to step off to right & left for direction changes. Verbal cues on upper body posture for left shoulder to reduce pain.  Gait Training: Patient ambulated 500' with folded rollator walker with PT adjusting height & location of walker for ideal left shoulder position.  Patient negotiated ramp & curb with folded rollator walker with cues on technique / brake use for safety. Pt verbalized better understanding. Patient ambulated 200' X 2 with [facility] with supervision with cues on orientation (not external rotated with heel on line of progression).                              PT Short Term Goals - 11/18/15 1028    PT SHORT TERM GOAL #1   Title Patient demonstrates understanding of updated HEP.  (Target Date: 11/19/2015)   Baseline MET 11/15/2015   Time 4   Period Weeks   Status Achieved   PT SHORT TERM GOAL #2   Title Berg Balance >/= 38/56  (Target Date: 11/19/2015)   Baseline MET 11/18/2015 [name] Balance 39/56   Time 4   Period Weeks   Status Achieved   PT SHORT TERM GOAL #3   Title Tiimed Up & Go without device <17sec with supervision.  (Target Date: 11/19/2015)   Baseline MET 11/18/2015 TUG without device 16.91sec with supervision.    Time 4   Period Weeks   Status Achieved   PT SHORT TERM GOAL #4   Title Patient ambulates around obstacles with SBQC 300' with supervision.  (Target Date: 11/19/2015)   Baseline MET 11/15/2015    Time 4   Period Weeks   Status Achieved   PT SHORT TERM GOAL #5   Title Patient negotiates ramps & curbs with [facility] with supervision.  (Target Date: 11/19/2015)   Baseline MET 11/15/2015   Time 4   Period Weeks   Status Achieved  PT Long Term Goals - 11/25/15 1015    PT LONG TERM GOAL #1   Title Patient verbalizes & demonstrates understanding of updated, ongoing fitness program / HEP. (Target Date: 12/17/2015)   Time 8   Period Weeks   Status On-going   PT LONG TERM GOAL #2   Title Berg Balance >45/56 to indicate lower fall risk.  (Target Date: 12/17/2015)   Time 8   Period Weeks   Status On-going   PT LONG TERM GOAL #3   Title Timed Up & Go without device <15 sec safely to indicate lower fall risk.  (Target Date: 12/17/2015)   Time 8   Period Weeks   Status On-going   PT LONG TERM GOAL #4   Title Patient able to scan environment while ambulating without fall risk.  (Target Date: 12/17/2015)   Time 8   Period Weeks   Status On-going   PT LONG TERM GOAL #5   Title Gait Velocity increases from 2.03 ft/sec to >2.2 ft/sec  (Target Date: 12/17/2015)   Time 8   Period Weeks   Status On-going               Plan - 12/09/15 1015    Clinical Impression Statement Patient appears to understand how to negotiate ramps & curbs when he is pushing his rollator walker for morning walk to work on endurance. Patient's gait improved when he focused on right foot orientation.    Pt will benefit from skilled therapeutic intervention in order to improve on the following deficits Abnormal gait;Decreased activity tolerance;Decreased balance;Decreased knowledge of use of DME;Decreased mobility;Decreased strength;Impaired flexibility   Rehab Potential Good   PT Frequency 2x / week   PT Duration 8 weeks   PT Treatment/Interventions ADLs/Self Care Home Management;DME Instruction;Gait training;Stair training;Functional mobility training;Therapeutic activities;Therapeutic exercise;Balance  training;Neuromuscular re-education;Patient/family education;Orthotic Fit/Training   PT Next Visit Plan begin to assess LTGs for discharge over next 2 visits.   Consulted and Agree with Plan of Care Patient        Problem List Patient Active Problem List   Diagnosis Date Noted  . Dry cough 10/25/2015  . Gait disturbance, post-stroke 10/05/2015  . Presbycusis of both ears   . Advanced care planning/counseling discussion 06/23/2015  . Skin rash 06/23/2015  . Impaired mobility 04/23/2015  . HTN (hypertension) 03/18/2015  . Chronic diastolic heart failure, NYHA class 1 ([place]) 03/18/2015  . Spastic hemiparesis affecting dominant side ([place]) 07/07/2014  . Left shoulder pain 03/16/2014  . Chest pressure 02/03/2014  . Medicare annual wellness visit, subsequent 11/06/2013  . Erectile dysfunction due to arterial insufficiency 02/05/2013  . Depression   . Palpitations 06/26/2012  . HLD (hyperlipidemia)   . Restless leg syndrome   . GERD (gastroesophageal reflux disease)   . BPH with obstruction/lower urinary tract symptoms   . History of CVA (cerebrovascular accident) 03/11/2012  . Hemiparesis affecting right side as late effect of stroke ([place]) 03/11/2012  . Type 2 diabetes, controlled, with neuropathy ([place]) 03/11/2012  . OSA (obstructive sleep apnea) 03/11/2012  . CKD (chronic kidney disease) stage 3, GFR 30-59 ml/min 03/11/2012  . Migraines 03/11/2012    Maretta Overdorf PT, DPT 12/10/2015, 11:51 AM  [place] [address] [place], [name], [address] Phone: [phone]   Fax:  [phone]  Name: James Bell MRN: [id number] Date of Birth: 1930/11/30

## 2015-12-13 ENCOUNTER — Encounter: Payer: Self-pay | Admitting: Physical Therapy

## 2015-12-13 ENCOUNTER — Ambulatory Visit: Payer: Medicare Other | Admitting: Physical Therapy

## 2015-12-13 DIAGNOSIS — R29818 Other symptoms and signs involving the nervous system: Secondary | ICD-10-CM | POA: Diagnosis not present

## 2015-12-13 DIAGNOSIS — R269 Unspecified abnormalities of gait and mobility: Secondary | ICD-10-CM

## 2015-12-13 DIAGNOSIS — R2689 Other abnormalities of gait and mobility: Secondary | ICD-10-CM

## 2015-12-13 DIAGNOSIS — R531 Weakness: Secondary | ICD-10-CM

## 2015-12-13 DIAGNOSIS — R2681 Unsteadiness on feet: Secondary | ICD-10-CM | POA: Diagnosis not present

## 2015-12-13 DIAGNOSIS — IMO0002 Reserved for concepts with insufficient information to code with codable children: Secondary | ICD-10-CM

## 2015-12-13 DIAGNOSIS — I69951 Hemiplegia and hemiparesis following unspecified cerebrovascular disease affecting right dominant side: Secondary | ICD-10-CM | POA: Diagnosis not present

## 2015-12-13 DIAGNOSIS — R278 Other lack of coordination: Secondary | ICD-10-CM

## 2015-12-13 DIAGNOSIS — R279 Unspecified lack of coordination: Secondary | ICD-10-CM

## 2015-12-13 DIAGNOSIS — I69898 Other sequelae of other cerebrovascular disease: Secondary | ICD-10-CM | POA: Diagnosis not present

## 2015-12-13 NOTE — Therapy (Signed)
[facility] [address] [place], [name], [address] Phone: [phone]   Fax:  [phone]  Physical Therapy Treatment  Patient Details  Name: James Bell MRN: [id number] Date of Birth: 10-16-1930 Referring Provider: [name], MD  Encounter Date: 12/13/2015      PT End of Session - 12/13/15 1015    Visit Number 15   Number of Visits 17   Date for PT Re-Evaluation 12/17/15   Authorization Type Medicare G-Code & Progress Note every 10 visits   PT Start Time 0930   PT Stop Time 1014   PT Time Calculation (min) 44 min   Equipment Utilized During Treatment Gait belt   Activity Tolerance Patient tolerated treatment well   Behavior During Therapy [facility] for tasks assessed/performed      Past Medical History  Diagnosis Date  . HLD (hyperlipidemia)   . Restless leg syndrome     well controlled on mirapex  . OSA on CPAP     sleep study 01/2014 - severe, CPAP at 10, previously on nuvigil (Dr. [name])  . Type 2 diabetes, controlled, with neuropathy [facility]) 2011    [name] in [place]  . H/O hiatal hernia   . GERD (gastroesophageal reflux disease)     with sliding HH  . Migraines 03/11/12    now rare  . CVA (cerebral infarction) 03/07/12    slurred speech; right hemplegia, left handed - completed PT 07/2014 (17 sessions)  . Basal cell carcinoma of nose 2002    bridge of nose  . History of kidney problems 12/2010    lacerated left kidney after fall  . BPH with obstruction/lower urinary tract symptoms     failed flomax, uroxatral, myrbetriq - sees urology [name]) pending videourodynamics  . Depression     mild, on cymbalta per prior PCP  . Palpitations 01/2012    holter WNL, rare PAC/PVCs  . Erectile dysfunction     per urology (prostaglandin injection)  . Kidney cysts 02/2014    bilateral (renal US)  . CKD (chronic kidney disease) stage 3, GFR 30-59 ml/min 03/11/2012    Renal US 02/2014 - multiple  kidney cysts   . TIA (transient ischemic attack)   . Anemia   . Presbycusis of both ears 2016    rec amplification [facility])    Past Surgical History  Procedure Laterality Date  . Prostate surgery  02/2009    PVP (laser)  . Tonsillectomy and adenoidectomy  ~ 1945  . Hemorrhoid surgery  1991  . Cataract extraction w/ intraocular lens implant  11//2012 - 08/2011  . Inguinal hernia repair  1981; 2002    left, then repaired  . Skin cancer excision  2002    bridge of nose, BCC  . Mri brain  04/2010    WNL  . Esophagogastroduodenoscopy  2006    duodenitis, medual HH, Grade 2 reflux, mild gastritis  . Colonoscopy  03/2011    poor prep, unable to biopsy polyp in tic fold, rpt 3 mo [name])  . Abi  2007    WNL  . Urethral dilation  2014    tannenbaum  . Cystoscopy with urethral dilatation N/A 07/24/2013    Procedure: CYSTOSCOPY WITH URETHRAL DILATATION;  Surgeon: [name], MD;  Location: [facility];  Service: Urology;  Laterality: N/A;  . Transurethral incision of prostate N/A 07/24/2013    Procedure: TRANSURETHRAL INCISION OF THE PROSTATE (TUIP);  Surgeon: [name], MD;  Location: [facility];  Service: Urology;  Laterality: N/A;  . Esophagogastroduodenoscopy  07/2011    mild chronic gastritis  . Colonoscopy  06/2011    mult polyps adenomatous, severe diverticulosis, rpt 3 yrs [name])  . Carotid endarterectomy    . Cardiac catheterization  2004    normal; [facility]  . Cardiovascular stress test  02/2014    WNL, low risk scan, EF 68%    There were no vitals filed for this visit.  Visit Diagnosis:  Abnormality of gait  Unsteadiness  Balance problems  Weakness due to cerebrovascular accident  Lack of coordination due to stroke  Generalized weakness  Decreased coordination  Hemiplegia affecting right side in right-dominant patient as late effect of cerebrovascular disease ([place])      Subjective Assessment - 12/13/15 0934    Subjective No falls. He walked  with rollator without shoulder pain.   Patient Stated Goals He wants to work on his balance & strength including core & RLE   Currently in Pain? No/denies            [facility] PT Assessment - 12/13/15 0930    Berg Balance Test   Sit to Stand Able to stand without using hands and stabilize independently   Standing Unsupported Able to stand safely 2 minutes   Sitting with Back Unsupported but Feet Supported on Floor or Stool Able to sit safely and securely 2 minutes   Stand to Sit Sits safely with minimal use of hands   Transfers Able to transfer safely, minor use of hands   Standing Unsupported with Eyes Closed Able to stand 10 seconds safely   Standing Ubsupported with Feet Together Able to place feet together independently and stand 1 minute safely   From Standing, Reach Forward with Outstretched Arm Can reach confidently >25 cm (10")   From Standing Position, Pick up Object from Floor Able to pick up shoe safely and easily   From Standing Position, Turn to Look Behind Over each Shoulder Looks behind one side only/other side shows less weight shift   Turn 360 Degrees Able to turn 360 degrees safely but slowly   Standing Unsupported, Alternately Place Feet on Step/Stool Able to complete >2 steps/needs minimal assist   Standing Unsupported, One Foot in Front Able to take small step independently and hold 30 seconds   Standing on One Leg Tries to lift leg/unable to hold 3 seconds but remains standing independently   Total Score 45                     [facility] Adult PT Treatment/Exercise - 12/13/15 0930    Ambulation/Gait   Ambulation/Gait Yes   Ambulation/Gait Assistance 5: Supervision   Ambulation/Gait Assistance Details missteps with patient ability to recover with supervision. Worked on changing directions & increasing speed to cross a street.     Ambulation Distance (Feet) 1000 Feet  500' & 1000'   Assistive device Small based quad cane   Gait Pattern Step-through  pattern;Decreased arm swing - right;Decreased step length - right;Decreased stance time - right;Decreased stride length;Decreased hip/knee flexion - right;Right hip hike;Right foot flat;Right flexed knee in stance;Ataxic;Narrow base of support;Trunk rotated posteriorly on right;Decreased trunk rotation   Ambulation Surface Indoor;Level;Outdoor;Grass;Paved;Unlevel   Stairs Yes   Stairs Assistance 5: Supervision   Stair Management Technique One rail Left;Alternating pattern;Forwards   Number of Stairs 4   Ramp 4: Min assist  SBQC   Curb 4: Min assist  [facility]   Standardized Balance Assessment  Standardized Balance Assessment Berg Balance Test;Timed Up and Go Test   High Level Balance   High Level Balance Activities Side stepping;Head turns;Negotitating around obstacles  head turns sideways, up/down & diagonals to scan   High Level Balance Comments tactile cues for balance reactions, maintain path & pace with scanning   Neuro Re-ed    Neuro Re-ed Details  Balance /coordination activities: at counter alternae tapping markers anterior, abduction, posterior; alternating LEs tapping 4 corners of square for limits of stability with UE support on mirror wall anterior to him with minA /tactile cues at pelvis for righting reactions; 4 square stepping with minA for balance reactions.                   PT Short Term Goals - 11/18/15 1028    PT SHORT TERM GOAL #1   Title Patient demonstrates understanding of updated HEP.  (Target Date: 11/19/2015)   Baseline MET 11/15/2015   Time 4   Period Weeks   Status Achieved   PT SHORT TERM GOAL #2   Title Berg Balance >/= 38/56  (Target Date: 11/19/2015)   Baseline MET 11/18/2015 [name] Balance 39/56   Time 4   Period Weeks   Status Achieved   PT SHORT TERM GOAL #3   Title Tiimed Up & Go without device <17sec with supervision.  (Target Date: 11/19/2015)   Baseline MET 11/18/2015 TUG without device 16.91sec with supervision.    Time 4   Period Weeks    Status Achieved   PT SHORT TERM GOAL #4   Title Patient ambulates around obstacles with SBQC 300' with supervision.  (Target Date: 11/19/2015)   Baseline MET 11/15/2015   Time 4   Period Weeks   Status Achieved   PT SHORT TERM GOAL #5   Title Patient negotiates ramps & curbs with [facility] with supervision.  (Target Date: 11/19/2015)   Baseline MET 11/15/2015   Time 4   Period Weeks   Status Achieved           PT Long Term Goals - 12/13/15 1610    PT LONG TERM GOAL #1   Title Patient verbalizes & demonstrates understanding of updated, ongoing fitness program / HEP. (Target Date: 12/17/2015)   Time 8   Period Weeks   Status On-going   PT LONG TERM GOAL #2   Title Berg Balance >/= 45/56 to indicate lower fall risk.  (Target Date: 12/17/2015)   Baseline MET 12/13/2015 [name] Balance 45/56   Time 8   Period Weeks   Status Achieved   PT LONG TERM GOAL #3   Title Timed Up & Go without device <15 sec safely to indicate lower fall risk.  (Target Date: 12/17/2015)   Time 8   Period Weeks   Status On-going   PT LONG TERM GOAL #4   Title Patient able to scan environment while ambulating without fall risk.  (Target Date: 12/17/2015)   Time 8   Period Weeks   Status On-going   PT LONG TERM GOAL #5   Title Gait Velocity increases from 2.03 ft/sec to >2.2 ft/sec  (Target Date: 12/17/2015)   Time 8   Period Weeks   Status On-going               Plan - 12/13/15 1611    Clinical Impression Statement Patient met 1 LTG today and anticipate meeting remainder next session. Patient appears ready for discharge from PT next visit.    Pt will benefit from  skilled therapeutic intervention in order to improve on the following deficits Abnormal gait;Decreased activity tolerance;Decreased balance;Decreased knowledge of use of DME;Decreased mobility;Decreased strength;Impaired flexibility   Rehab Potential Good   PT Frequency 2x / week   PT Duration 8 weeks   PT Treatment/Interventions ADLs/Self Care  Home Management;DME Instruction;Gait training;Stair training;Functional mobility training;Therapeutic activities;Therapeutic exercise;Balance training;Neuromuscular re-education;Patient/family education;Orthotic Fit/Training   PT Next Visit Plan assess LTGs for discharge   Consulted and Agree with Plan of Care Patient        Problem List Patient Active Problem List   Diagnosis Date Noted  . Dry cough 10/25/2015  . Gait disturbance, post-stroke 10/05/2015  . Presbycusis of both ears   . Advanced care planning/counseling discussion 06/23/2015  . Skin rash 06/23/2015  . Impaired mobility 04/23/2015  . HTN (hypertension) 03/18/2015  . Chronic diastolic heart failure, NYHA class 1 ([place]) 03/18/2015  . Spastic hemiparesis affecting dominant side ([place]) 07/07/2014  . Left shoulder pain 03/16/2014  . Chest pressure 02/03/2014  . Medicare annual wellness visit, subsequent 11/06/2013  . Erectile dysfunction due to arterial insufficiency 02/05/2013  . Depression   . Palpitations 06/26/2012  . HLD (hyperlipidemia)   . Restless leg syndrome   . GERD (gastroesophageal reflux disease)   . BPH with obstruction/lower urinary tract symptoms   . History of CVA (cerebrovascular accident) 03/11/2012  . Hemiparesis affecting right side as late effect of stroke ([place]) 03/11/2012  . Type 2 diabetes, controlled, with neuropathy ([place]) 03/11/2012  . OSA (obstructive sleep apnea) 03/11/2012  . CKD (chronic kidney disease) stage 3, GFR 30-59 ml/min 03/11/2012  . Migraines 03/11/2012    Marianna Cid PT, DPT 12/13/2015, 4:13 PM  [facility] [address] [place], [name], [address] Phone: [phone]   Fax:  [phone]  Name: DETRIC SCALISI MRN: [id number] Date of Birth: 1930-11-22

## 2015-12-14 ENCOUNTER — Ambulatory Visit: Payer: Medicare Other | Admitting: Physical Therapy

## 2015-12-16 ENCOUNTER — Encounter: Payer: Self-pay | Admitting: Physical Therapy

## 2015-12-16 ENCOUNTER — Ambulatory Visit: Payer: Medicare Other | Admitting: Physical Therapy

## 2015-12-16 DIAGNOSIS — R531 Weakness: Secondary | ICD-10-CM | POA: Diagnosis not present

## 2015-12-16 DIAGNOSIS — R29818 Other symptoms and signs involving the nervous system: Secondary | ICD-10-CM | POA: Diagnosis not present

## 2015-12-16 DIAGNOSIS — IMO0002 Reserved for concepts with insufficient information to code with codable children: Secondary | ICD-10-CM

## 2015-12-16 DIAGNOSIS — R2681 Unsteadiness on feet: Secondary | ICD-10-CM | POA: Diagnosis not present

## 2015-12-16 DIAGNOSIS — I69898 Other sequelae of other cerebrovascular disease: Secondary | ICD-10-CM | POA: Diagnosis not present

## 2015-12-16 DIAGNOSIS — I69951 Hemiplegia and hemiparesis following unspecified cerebrovascular disease affecting right dominant side: Secondary | ICD-10-CM | POA: Diagnosis not present

## 2015-12-16 DIAGNOSIS — R269 Unspecified abnormalities of gait and mobility: Secondary | ICD-10-CM

## 2015-12-16 DIAGNOSIS — R2689 Other abnormalities of gait and mobility: Secondary | ICD-10-CM

## 2015-12-16 NOTE — Therapy (Signed)
[facility] [address] [place], [name], [address] Phone: [phone]   Fax:  [phone]  Physical Therapy Treatment  Patient Details  Name: James Bell MRN: [id number] Date of Birth: June 16, 1931 Referring Provider: [name], MD  Encounter Date: 12/16/2015      PT End of Session - 12/15/15 1100    Visit Number 16   Number of Visits 17   Date for PT Re-Evaluation 12/17/15   Authorization Type Medicare G-Code & Progress Note every 10 visits   PT Start Time 1015   PT Stop Time 1055   PT Time Calculation (min) 40 min   Equipment Utilized During Treatment Gait belt   Activity Tolerance Patient tolerated treatment well   Behavior During Therapy [facility] for tasks assessed/performed      Past Medical History  Diagnosis Date  . HLD (hyperlipidemia)   . Restless leg syndrome     well controlled on mirapex  . OSA on CPAP     sleep study 01/2014 - severe, CPAP at 10, previously on nuvigil (Dr. [name])  . Type 2 diabetes, controlled, with neuropathy [facility]) 2011    [name] in [place]  . H/O hiatal hernia   . GERD (gastroesophageal reflux disease)     with sliding HH  . Migraines 03/11/12    now rare  . CVA (cerebral infarction) 03/07/12    slurred speech; right hemplegia, left handed - completed PT 07/2014 (17 sessions)  . Basal cell carcinoma of nose 2002    bridge of nose  . History of kidney problems 12/2010    lacerated left kidney after fall  . BPH with obstruction/lower urinary tract symptoms     failed flomax, uroxatral, myrbetriq - sees urology [name]) pending videourodynamics  . Depression     mild, on cymbalta per prior PCP  . Palpitations 01/2012    holter WNL, rare PAC/PVCs  . Erectile dysfunction     per urology (prostaglandin injection)  . Kidney cysts 02/2014    bilateral (renal US)  . CKD (chronic kidney disease) stage 3, GFR 30-59 ml/min 03/11/2012    Renal US 02/2014 - multiple  kidney cysts   . TIA (transient ischemic attack)   . Anemia   . Presbycusis of both ears 2016    rec amplification [facility])    Past Surgical History  Procedure Laterality Date  . Prostate surgery  02/2009    PVP (laser)  . Tonsillectomy and adenoidectomy  ~ 1945  . Hemorrhoid surgery  1991  . Cataract extraction w/ intraocular lens implant  11//2012 - 08/2011  . Inguinal hernia repair  1981; 2002    left, then repaired  . Skin cancer excision  2002    bridge of nose, BCC  . Mri brain  04/2010    WNL  . Esophagogastroduodenoscopy  2006    duodenitis, medual HH, Grade 2 reflux, mild gastritis  . Colonoscopy  03/2011    poor prep, unable to biopsy polyp in tic fold, rpt 3 mo [name])  . Abi  2007    WNL  . Urethral dilation  2014    tannenbaum  . Cystoscopy with urethral dilatation N/A 07/24/2013    Procedure: CYSTOSCOPY WITH URETHRAL DILATATION;  Surgeon: [name], MD;  Location: [facility];  Service: Urology;  Laterality: N/A;  . Transurethral incision of prostate N/A 07/24/2013    Procedure: TRANSURETHRAL INCISION OF THE PROSTATE (TUIP);  Surgeon: [name], MD;  Location: [facility];  Service: Urology;  Laterality: N/A;  . Esophagogastroduodenoscopy  07/2011    mild chronic gastritis  . Colonoscopy  06/2011    mult polyps adenomatous, severe diverticulosis, rpt 3 yrs [facility])  . Carotid endarterectomy    . Cardiac catheterization  2004    normal; [facility]  . Cardiovascular stress test  02/2014    WNL, low risk scan, EF 68%    There were no vitals filed for this visit.  Visit Diagnosis:  Abnormality of gait  Unsteadiness  Balance problems  Weakness due to cerebrovascular accident      Subjective Assessment - 12/16/15 1022    Subjective reports he sees improvement in his mobility including less falls since starting this episode of PT.    Patient Stated Goals He wants to work on his balance & strength including core & RLE   Currently in Pain?  No/denies            [facility] PT Assessment - 12/16/15 1015    Ambulation/Gait   Ambulation/Gait Yes   Ambulation/Gait Assistance 6: Modified independent (Device/Increase time)   Ambulation Distance (Feet) 1100 Feet   Assistive device Small based quad cane   Gait Pattern Step-through pattern;Decreased arm swing - right;Decreased stance time - right;Decreased stride length;Decreased hip/knee flexion - right;Right hip hike;Right foot flat;Right flexed knee in stance;Narrow base of support;Trunk rotated posteriorly on right;Decreased trunk rotation;Decreased step length - left   Ambulation Surface Indoor;Level;Outdoor;Unlevel;Paved;Gravel;Grass   Gait velocity comfortable 2.11 ft/sec & fast pace 2.5 ft/sec   Stairs Yes   Stairs Assistance 6: Modified independent (Device/Increase time)   Stair Management Technique One rail Left;Alternating pattern;Forwards   Number of Stairs 4   Ramp 6: Modified independent (Device)  SBQC   Curb 6: Modified independent (Device/increase time)  SQBC   Timed Up and Go Test   Normal TUG (seconds) 14.89   Functional Gait  Assessment   Gait assessed  Yes   Gait Level Surface Walks 20 ft, slow speed, abnormal gait pattern, evidence for imbalance or deviates 10-15 in outside of the 12 in walkway width. Requires more than 7 sec to ambulate 20 ft.   Change in Gait Speed Makes only minor adjustments to walking speed, or accomplishes a change in speed with significant gait deviations, deviates 10-15 in outside the 12 in walkway width, or changes speed but loses balance but is able to recover and continue walking.   Gait with Horizontal Head Turns Performs head turns smoothly with slight change in gait velocity (eg, minor disruption to smooth gait path), deviates 6-10 in outside 12 in walkway width, or uses an assistive device.   Gait with Vertical Head Turns Performs task with slight change in gait velocity (eg, minor disruption to smooth gait path), deviates 6 - 10 in  outside 12 in walkway width or uses assistive device   Gait and Pivot Turn Pivot turns safely in greater than 3 sec and stops with no loss of balance, or pivot turns safely within 3 sec and stops with mild imbalance, requires small steps to catch balance.   Step Over Obstacle Cannot perform without assistance.   Gait with Narrow Base of Support Ambulates less than 4 steps heel to toe or cannot perform without assistance.   Gait with Eyes Closed Cannot walk 20 ft without assistance, severe gait deviations or imbalance, deviates greater than 15 in outside 12 in walkway width or will not attempt task.   Ambulating Backwards Walks 20 ft, slow speed, abnormal  gait pattern, evidence for imbalance, deviates 10-15 in outside 12 in walkway width.   Steps Alternating feet, must use rail.   Total Score 11                               PT Short Term Goals - 11/18/15 1028    PT SHORT TERM GOAL #1   Title Patient demonstrates understanding of updated HEP.  (Target Date: 11/19/2015)   Baseline MET 11/15/2015   Time 4   Period Weeks   Status Achieved   PT SHORT TERM GOAL #2   Title Berg Balance >/= 38/56  (Target Date: 11/19/2015)   Baseline MET 11/18/2015 [name] Balance 39/56   Time 4   Period Weeks   Status Achieved   PT SHORT TERM GOAL #3   Title Tiimed Up & Go without device <17sec with supervision.  (Target Date: 11/19/2015)   Baseline MET 11/18/2015 TUG without device 16.91sec with supervision.    Time 4   Period Weeks   Status Achieved   PT SHORT TERM GOAL #4   Title Patient ambulates around obstacles with SBQC 300' with supervision.  (Target Date: 11/19/2015)   Baseline MET 11/15/2015   Time 4   Period Weeks   Status Achieved   PT SHORT TERM GOAL #5   Title Patient negotiates ramps & curbs with [facility] with supervision.  (Target Date: 11/19/2015)   Baseline MET 11/15/2015   Time 4   Period Weeks   Status Achieved           PT Long Term Goals - 12/15/15 1100    PT LONG TERM  GOAL #1   Title Patient verbalizes & demonstrates understanding of updated, ongoing fitness program / HEP. (Target Date: 12/17/2015)   Baseline MET 12/16/2015   Time 8   Period Weeks   Status Achieved   PT LONG TERM GOAL #2   Title Berg Balance >/= 45/56 to indicate lower fall risk.  (Target Date: 12/17/2015)   Baseline MET 12/13/2015 [name] Balance 45/56   Time 8   Period Weeks   Status Achieved   PT LONG TERM GOAL #3   Title Timed Up & Go without device <15 sec safely to indicate lower fall risk.  (Target Date: 12/17/2015)   Baseline MET 12/16/2015 TUG 14.89 sec with LBQC   Time 8   Period Weeks   Status Achieved   PT LONG TERM GOAL #4   Title Patient able to scan environment while ambulating without fall risk.  (Target Date: 12/17/2015)   Baseline MET 12/16/2015   Time 8   Period Weeks   Status Achieved   PT LONG TERM GOAL #5   Title Gait Velocity increases from 2.03 ft/sec to >2.2 ft/sec  (Target Date: 12/17/2015)   Baseline Partially MET  Comfortable gait velocity 2.11 ft/sec and fast gait velocity 2.5 ft/sec   Time 8   Period Weeks   Status Partially Met               Plan - 12/16/15 1100    Clinical Impression Statement Patient met or partially met all LTGs. Patient still has fall risk noted by TUG 14.89 sec & Berg Balance 45/56 scores but low risk with higher scores. Patient appears to understand ongoing fitness plan & HEP.    Pt will benefit from skilled therapeutic intervention in order to improve on the following deficits Abnormal gait;Decreased activity tolerance;Decreased balance;Decreased knowledge of  use of DME;Decreased mobility;Decreased strength;Impaired flexibility   Rehab Potential Good   PT Frequency 2x / week   PT Duration 8 weeks   PT Treatment/Interventions ADLs/Self Care Home Management;DME Instruction;Gait training;Stair training;Functional mobility training;Therapeutic activities;Therapeutic exercise;Balance training;Neuromuscular  re-education;Patient/family education;Orthotic Fit/Training   PT Next Visit Plan discharge   Consulted and Agree with Plan of Care Patient          G-Codes - 2015-12-18 1315    Functional Assessment Tool Used Berg Balance 45/56 and TUG 14.98sec   Functional Limitation Mobility: Walking and moving around   Mobility: Walking and Moving Around Goal Status [phone]) At least 40 percent but less than 60 percent impaired, limited or restricted   Mobility: Walking and Moving Around Discharge Status [phone]) At least 40 percent but less than 60 percent impaired, limited or restricted      Problem List Patient Active Problem List   Diagnosis Date Noted  . Dry cough 10/25/2015  . Gait disturbance, post-stroke 10/05/2015  . Presbycusis of both ears   . Advanced care planning/counseling discussion 06/23/2015  . Skin rash 06/23/2015  . Impaired mobility 04/23/2015  . HTN (hypertension) 03/18/2015  . Chronic diastolic heart failure, NYHA class 1 ([place]) 03/18/2015  . Spastic hemiparesis affecting dominant side ([place]) 07/07/2014  . Left shoulder pain 03/16/2014  . Chest pressure 02/03/2014  . Medicare annual wellness visit, subsequent 11/06/2013  . Erectile dysfunction due to arterial insufficiency 02/05/2013  . Depression   . Palpitations 06/26/2012  . HLD (hyperlipidemia)   . Restless leg syndrome   . GERD (gastroesophageal reflux disease)   . BPH with obstruction/lower urinary tract symptoms   . History of CVA (cerebrovascular accident) 03/11/2012  . Hemiparesis affecting right side as late effect of stroke ([place]) 03/11/2012  . Type 2 diabetes, controlled, with neuropathy ([place]) 03/11/2012  . OSA (obstructive sleep apnea) 03/11/2012  . CKD (chronic kidney disease) stage 3, GFR 30-59 ml/min 03/11/2012  . Migraines 03/11/2012    Iya Hamed PT, DPT 12/18/2015, 3:53 PM  [facility] [address] [place], [name], [address] Phone:  [phone]   Fax:  [phone]  Name: VIOLA PLACERES MRN: [id number] Date of Birth: 03-Nov-1930  PHYSICAL THERAPY DISCHARGE SUMMARY  Visits from Start of Care: 16  Current functional level related to goals / functional outcomes: See above   Remaining deficits: See above   Education / Equipment: HEP / fitness plan  Plan: Patient agrees to discharge.  Patient goals were met. Patient is being discharged due to meeting the stated rehab goals.  ?????    Jamey Reas, PT, DPT PT Specializing in [place] 2015/12/18 3:54 PM Phone:  [phone]  Fax:  [phone] [place] [address] [place], [place] [address]

## 2015-12-24 ENCOUNTER — Other Ambulatory Visit: Payer: Self-pay | Admitting: *Deleted

## 2015-12-24 MED ORDER — OMEPRAZOLE 40 MG PO CPDR: 40.0000 mg | DELAYED_RELEASE_CAPSULE | Freq: Every day | ORAL | Status: DC

## 2015-12-28 ENCOUNTER — Other Ambulatory Visit: Payer: Self-pay

## 2015-12-28 MED ORDER — GABAPENTIN 300 MG PO CAPS: 300.0000 mg | ORAL_CAPSULE | Freq: Every day | ORAL | Status: DC

## 2015-12-28 NOTE — Telephone Encounter (Signed)
Pt request refill gabapentin 300 mg taking bid. Pt said he has been taking for couple of years. I cannot find on current or hx med list; pt last seen 10/25/15. [place].Please advise.

## 2015-12-28 NOTE — Telephone Encounter (Signed)
What is he taking this for? Diabetic neuropathy? Reviewed latest endo note in our system from 2015 - not on his list as well. Ok to refill but recommend try 300mg  nightly only (not twice daily) and see if same effect.  Med refilled, lower dose.

## 2015-12-29 NOTE — Telephone Encounter (Signed)
Left message for patient to return call.

## 2016-01-11 ENCOUNTER — Ambulatory Visit: Payer: Medicare Other | Admitting: Physical Medicine & Rehabilitation

## 2016-01-11 ENCOUNTER — Other Ambulatory Visit: Payer: Self-pay | Admitting: *Deleted

## 2016-01-11 MED ORDER — METOPROLOL TARTRATE 25 MG PO TABS: 12.5000 mg | ORAL_TABLET | Freq: Two times a day (BID) | ORAL | Status: DC

## 2016-01-17 ENCOUNTER — Encounter: Payer: Medicare Other | Attending: Physical Medicine & Rehabilitation

## 2016-01-17 ENCOUNTER — Encounter: Payer: Self-pay | Admitting: Physical Medicine & Rehabilitation

## 2016-01-17 ENCOUNTER — Ambulatory Visit (HOSPITAL_BASED_OUTPATIENT_CLINIC_OR_DEPARTMENT_OTHER): Payer: Medicare Other | Admitting: Physical Medicine & Rehabilitation

## 2016-01-17 VITALS — BP 124/69 | HR 73

## 2016-01-17 DIAGNOSIS — M7989 Other specified soft tissue disorders: Secondary | ICD-10-CM | POA: Diagnosis not present

## 2016-01-17 DIAGNOSIS — G8110 Spastic hemiplegia affecting unspecified side: Secondary | ICD-10-CM | POA: Insufficient documentation

## 2016-01-17 DIAGNOSIS — I69351 Hemiplegia and hemiparesis following cerebral infarction affecting right dominant side: Secondary | ICD-10-CM

## 2016-01-17 NOTE — Progress Notes (Signed)
Subjective:    Patient ID: James Bell, male    DOB: Dec 19, 1930, 82 y.o.   MRN: [id number]  HPI Last Botox 06/29/2015 Biceps100 FCR50 FCU0 FDS50 FDP0  Patient denies any worsening of tone in his biceps weren't in his finger flexors.  Left shoulder pain mainly when using cane.Patient has had one fall a couple weeks ago but did not injure himself. He tripped on a step. His left shoulder pain occurs when he is pushing onto the cane. The patient has completed outpatient Physical therapy [facility] rehabilitation  He remains independent with all his ADLs, he drives  Pain Inventory Average Pain 0 Pain Right Now 0 My pain is no pain  In the last 24 hours, has pain interfered with the following? General activity 0 Relation with others 0 Enjoyment of life 0 What TIME of day is your pain at its worst? no pain Sleep (in general) NA  Pain is worse with: no pain Pain improves with: no pain Relief from Meds: no pain  Mobility use a cane Do you have any goals in this area?  no  Function retired Do you have any goals in this area?  no  Neuro/Psych No problems in this area  Prior Studies Any changes since last visit?  no  Physicians involved in your care Any changes since last visit?  no   Family History  Problem Relation Age of Onset  . Stroke Father   . Lung cancer Father 48     smoker  . Stroke Brother   . Diabetes Brother 59  . Coronary artery disease Neg Hx   . Lung cancer Brother   . Alcoholism Sister   . Kidney disease Sister    Social History   Social History  . Marital Status: Unknown    Spouse Name: N/A  . Number of Children: 2  . Years of Education: MD   Occupational History  . retired   .     Social History Main Topics  . Smoking status: Never Smoker   . Smokeless tobacco: Never Used     Comment: "used to smoke pipe when I was younger"  . Alcohol Use: 2.4 oz/week    2 Glasses of wine, 2 Shots of liquor per week     Comment:  occasional  . Drug Use: No  . Sexual Activity: Yes   Other Topics Concern  . None   Social History Narrative   Caffeine: 1 cup coffee/day    Occupation: ophthalmologist, retired   Edu: MD   Activity: going to gym 3x/wk with trainer   Diet: some water, fruits/vegetables some, red meat a few times, fish 1x/wk   POA - [name] (Daughter)   Past Surgical History  Procedure Laterality Date  . Prostate surgery  02/2009    PVP (laser)  . Tonsillectomy and adenoidectomy  ~ 1945  . Hemorrhoid surgery  1991  . Cataract extraction w/ intraocular lens implant  11//2012 - 08/2011  . Inguinal hernia repair  1981; 2002    left, then repaired  . Skin cancer excision  2002    bridge of nose, [facility]CC  . Mri brain  04/2010    WNL  . Esophagogastroduodenoscopy  2006    duodenitis, medual HH, Grade 2 reflux, mild gastritis  . Colonoscopy  03/2011    poor prep, unable to biopsy polyp in tic fold, rpt 3 mo [name])  . Abi  2007    WNL  . Urethral dilation  2014    tannenbaum  . Cystoscopy with urethral dilatation N/A 07/24/2013    Procedure: CYSTOSCOPY WITH URETHRAL DILATATION;  Surgeon: [name], MD;  Location: [facility];  Service: Urology;  Laterality: N/A;  . Transurethral incision of prostate N/A 07/24/2013    Procedure: TRANSURETHRAL INCISION OF THE PROSTATE (TUIP);  Surgeon: [name], MD;  Location: [facility];  Service: Urology;  Laterality: N/A;  . Esophagogastroduodenoscopy  07/2011    mild chronic gastritis  . Colonoscopy  06/2011    mult polyps adenomatous, severe diverticulosis, rpt 3 yrs [name])  . Carotid endarterectomy    . Cardiac catheterization  2004    normal; [place]  . Cardiovascular stress test  02/2014    WNL, low risk scan, EF 68%   Past Medical History  Diagnosis Date  . HLD (hyperlipidemia)   . Restless leg syndrome     well controlled on mirapex  . OSA on CPAP     sleep study 01/2014 - severe, CPAP at 10, previously on nuvigil (Dr. [name])  . Type 2 diabetes, controlled, with neuropathy [facility]) 2011    [name] in [place]  . H/O hiatal hernia   . GERD (gastroesophageal reflux disease)     with sliding HH  . Migraines 03/11/12    now rare  . CVA (cerebral infarction) 03/07/12    slurred speech; right hemplegia, left handed - completed PT 07/2014 (17 sessions)  . Basal cell carcinoma of nose 2002    bridge of nose  . History of kidney problems 12/2010    lacerated left kidney after fall  . BPH with obstruction/lower urinary tract symptoms     failed flomax, uroxatral, myrbetriq - sees urology [name]) pending videourodynamics  . Depression     mild, on cymbalta per prior PCP  . Palpitations 01/2012    holter WNL, rare PAC/PVCs  . Erectile dysfunction     per urology (prostaglandin injection)  . Kidney cysts 02/2014    bilateral (renal US)  . CKD (chronic kidney disease) stage 3, GFR 30-59 ml/min 03/11/2012    Renal US 02/2014 - multiple kidney cysts   . TIA (transient ischemic attack)   . Anemia   . Presbycusis of both ears 2016    rec amplification ([place])   BP 123456 mmHg  Pulse 73  SpO2 97%  Opioid Risk Score:   Fall Risk Score:  `1  Depression screen PHQ 2/9  Depression screen [facility] 2/9 01/17/2016 10/05/2015 06/23/2015 04/08/2015 11/06/2013  Decreased Interest 0 0 0 0 1  Down, Depressed, Hopeless 0 0 0 0 1  PHQ - 2 Score 0 0 0 0 2  Altered sleeping - - - - 3  Tired, decreased energy - - - - 1  Change in appetite - - - - 1  Feeling bad or failure about yourself  - - - - 0  Trouble concentrating - - - - 1  Moving slowly or fidgety/restless - - - - 0  Suicidal thoughts - - - - 0  PHQ-9 Score - - - - 8    Review of Systems  All other systems reviewed and are negative.      Objective:   Physical Exam  Constitutional: He is oriented to person, place, and time. He appears well-developed and well-nourished.  HENT:  Head: Normocephalic and atraumatic.  Eyes: Pupils are equal, round, and reactive to  light.  Neck: Normal range of motion.  Musculoskeletal:  Left shoulder: He exhibits tenderness. He exhibits normal range of motion, no effusion, no crepitus and no deformity.  Negative impingement sign left shoulder Tenderness over the left acromioclavicular joint  Neurological: He is alert and oriented to person, place, and time.  Motor strength is 3 minus in the right deltoid, biceps, triceps, grip,  4/5 in the right hip flexor and knee extensor and ankle dorsiflexor 5/5 on the left side in the left upper and lower limb.  Skin: Skin is warm and dry.  Psychiatric: He has a normal mood and affect.  Nursing note and vitals reviewed.  Ashworth grade 3 tone in the right bicep Ashworth 1 in the finger flexors       Assessment & Plan:  1.History of left basal ganglia infarct June 2013 with residual right hemiparesis mainly in the upper limb. He still has a gait disorder related to his stroke as well. His right upper limb spasticity has overall improved. Still has Ashworth grade 3 tone in the bicep, At this point his bicep tone can be overcome by reaching and stretching. Will hold off on repeat Botox injection unless his spasticity worsens again. He will continue his home exercise program as outlined by physical therapy. Return to clinic in 3 months

## 2016-01-17 NOTE — Patient Instructions (Addendum)
Please call if your right fist becomes clenched or the right elbow becomes flexed. We would repeat the Botox then. Shoulder Range of Motion Exercises Shoulder range of motion (ROM) exercises are designed to keep the shoulder moving freely. They are often recommended for people who have shoulder pain. MOVEMENT EXERCISE When you are able, do this exercise 5-6 days per week, or as told by your health care provider. Work toward doing 2 sets of 10 swings. Pendulum Exercise How To Do This Exercise Lying Down 1. Lie face-down on a bed with your abdomen close to the side of the bed. 2. Let your arm hang over the side of the bed. 3. Relax your shoulder, arm, and hand. 4. Slowly and gently swing your arm forward and back. Do not use your neck muscles to swing your arm. They should be relaxed. If you are struggling to swing your arm, have someone gently swing it for you. When you do this exercise for the first time, swing your arm at a 15 degree angle for 15 seconds, or swing your arm 10 times. As pain lessens over time, increase the angle of the swing to 30-45 degrees. 5. Repeat steps 1-4 with the other arm. How To Do This Exercise While Standing 1. Stand next to a sturdy chair or table and hold on to it with your hand.  Bend forward at the waist.  Bend your knees slightly.  Relax your other arm and let it hang limp.  Relax the shoulder blade of the arm that is hanging and let it drop.  While keeping your shoulder relaxed, use body motion to swing your arm in small circles. The first time you do this exercise, swing your arm for about 30 seconds or 10 times. When you do it next time, swing your arm for a little longer.  Stand up tall and relax.  Repeat steps 1-7, this time changing the direction of the circles. 2. Repeat steps 1-8 with the other arm. STRETCHING EXERCISES Do these exercises 3-4 times per day on 5-6 days per week or as told by your health care provider. Work toward holding the  stretch for 20 seconds. Stretching Exercise 1 1. Lift your arm straight out in front of you. 2. Bend your arm 90 degrees at the elbow (right angle) so your forearm goes across your body and looks like the letter "L." 3. Use your other arm to gently pull the elbow forward and across your body. 4. Repeat steps 1-3 with the other arm. Stretching Exercise 2 You will need a towel or rope for this exercise. 1. Bend one arm behind your back with the palm facing outward. 2. Hold a towel with your other hand. 3. Reach the arm that holds the towel above your head, and bend that arm at the elbow. Your wrist should be behind your neck. 4. Use your free hand to grab the free end of the towel. 5. With the higher hand, gently pull the towel up behind you. 6. With the lower hand, pull the towel down behind you. 7. Repeat steps 1-6 with the other arm. STRENGTHENING EXERCISES Do each of these exercises at four different times of day (sessions) every day or as told by your health care provider. To begin with, repeat each exercise 5 times (repetitions). Work toward doing 3 sets of 12 repetitions or as told by your health care provider. Strengthening Exercise 1 You will need a light weight for this activity. As you grow stronger, you may  use a heavier weight. 1. Standing with a weight in your hand, lift your arm straight out to the side until it is at the same height as your shoulder. 2. Bend your arm at 90 degrees so that your fingers are pointing to the ceiling. 3. Slowly raise your hand until your arm is straight up in the air. 4. Repeat steps 1-3 with the other arm. Strengthening Exercise 2 You will need a light weight for this activity. As you grow stronger, you may use a heavier weight. 1. Standing with a weight in your hand, gradually move your straight arm in an arc, starting at your side, then out in front of you, then straight up over your head. 2. Gradually move your other arm in an arc, starting at  your side, then out in front of you, then straight up over your head. 3. Repeat steps 1-2 with the other arm. Strengthening Exercise 3 You will need an elastic band for this activity. As you grow stronger, gradually increase the size of the bands or increase the number of bands that you use at one time. 1. While standing, hold an elastic band in one hand and raise that arm up in the air. 2. With your other hand, pull down the band until that hand is by your side. 3. Repeat steps 1-2 with the other arm.   This information is not intended to replace advice given to you by your health care provider. Make sure you discuss any questions you have with your health care provider.   Document Released: 06/03/2003 Document Revised: 01/19/2015 Document Reviewed: 08/31/2014 Elsevier Interactive Patient Education [employer].

## 2016-01-20 ENCOUNTER — Other Ambulatory Visit: Payer: Self-pay

## 2016-01-20 MED ORDER — METOPROLOL TARTRATE 25 MG PO TABS: 12.5000 mg | ORAL_TABLET | Freq: Two times a day (BID) | ORAL | Status: DC

## 2016-01-20 NOTE — Telephone Encounter (Signed)
Rx sent electronically.  

## 2016-01-25 ENCOUNTER — Other Ambulatory Visit: Payer: Self-pay | Admitting: *Deleted

## 2016-01-25 NOTE — Telephone Encounter (Signed)
Ok to refill to mail order? 

## 2016-01-26 MED ORDER — TRAZODONE HCL 50 MG PO TABS: 25.0000 mg | ORAL_TABLET | Freq: Every evening | ORAL | Status: DC | PRN

## 2016-01-26 NOTE — Telephone Encounter (Signed)
Sent e-scribe to mail order. Written script shredded.

## 2016-01-26 NOTE — Addendum Note (Signed)
Addended by: Royann Shivers A on: 01/26/2016 04:26 PM   Modules accepted: Orders

## 2016-02-02 ENCOUNTER — Telehealth: Payer: Self-pay | Admitting: *Deleted

## 2016-02-02 MED ORDER — TRAZODONE HCL 50 MG PO TABS: 50.0000 mg | ORAL_TABLET | Freq: Every day | ORAL | Status: DC

## 2016-02-02 NOTE — Telephone Encounter (Signed)
Noted that pt requests 50mg  bid. He was prior on 25mg  QHS PRN. Will increase to 50mg  QHS. Sent via electronic Rx.

## 2016-02-02 NOTE — Telephone Encounter (Signed)
Request from mail order to refill Trazodone 50mg  1 BID (?). Form in your IN box for review.

## 2016-02-07 ENCOUNTER — Telehealth: Payer: Self-pay

## 2016-02-07 NOTE — Telephone Encounter (Signed)
Pt called requesting a call back in reference to recent Rx sent for neurontin 300mg  QD---pt reports he had been taking BID---reviwing telephone note per Dr Darnell Level states he wants pt to try 1 caps QHS to see if it can have the same effect as BID dose.... Left message on voicemail

## 2016-02-08 NOTE — Telephone Encounter (Signed)
Spoke with patient. He is requesting gabapentin 300mg  BID instead of QHS. He said he has been taking it this way and it is working, but he is running out (as expected). Ok to refill for this SIG to mail order?

## 2016-02-09 ENCOUNTER — Other Ambulatory Visit: Payer: Self-pay

## 2016-02-09 MED ORDER — GABAPENTIN 300 MG PO CAPS: 300.0000 mg | ORAL_CAPSULE | Freq: Two times a day (BID) | ORAL | Status: DC

## 2016-02-09 MED ORDER — DIAZEPAM 5 MG PO TABS: 5.0000 mg | ORAL_TABLET | Freq: Three times a day (TID) | ORAL | Status: DC | PRN

## 2016-02-09 NOTE — Telephone Encounter (Signed)
Will send in gabapentin 300mg  bid

## 2016-02-09 NOTE — Telephone Encounter (Signed)
Printed and in Kim's box 

## 2016-02-09 NOTE — Telephone Encounter (Signed)
Last filled 10-19-15 #90. Last OV 06-23-15 Next OV 02-28-16. Please fax printed rx to mail order

## 2016-02-10 NOTE — Telephone Encounter (Signed)
Rx faxed to mail order 

## 2016-02-28 ENCOUNTER — Encounter: Payer: Self-pay | Admitting: Family Medicine

## 2016-02-28 ENCOUNTER — Ambulatory Visit (INDEPENDENT_AMBULATORY_CARE_PROVIDER_SITE_OTHER): Payer: Medicare Other | Admitting: Family Medicine

## 2016-02-28 VITALS — BP 126/64 | HR 72 | Temp 97.3°F | Wt 190.0 lb

## 2016-02-28 DIAGNOSIS — I69351 Hemiplegia and hemiparesis following cerebral infarction affecting right dominant side: Secondary | ICD-10-CM | POA: Diagnosis not present

## 2016-02-28 DIAGNOSIS — E785 Hyperlipidemia, unspecified: Secondary | ICD-10-CM

## 2016-02-28 DIAGNOSIS — E1140 Type 2 diabetes mellitus with diabetic neuropathy, unspecified: Secondary | ICD-10-CM

## 2016-02-28 DIAGNOSIS — I10 Essential (primary) hypertension: Secondary | ICD-10-CM

## 2016-02-28 DIAGNOSIS — F329 Major depressive disorder, single episode, unspecified: Secondary | ICD-10-CM

## 2016-02-28 DIAGNOSIS — F32A Depression, unspecified: Secondary | ICD-10-CM

## 2016-02-28 NOTE — Progress Notes (Signed)
BP 126/64 mmHg  Pulse 72  Temp(Src) 97.3 F (36.3 C) (Oral)  Wt 190 lb (86.183 kg)   CC: f/u visit  Subjective:    Patient ID: James Bell, male    DOB: December 22, 1930, 82 y.o.   MRN: [id number]  HPI: KEAN QUAIN is a 82 y.o. male presenting on 02/28/2016 for Follow-up   Weight down 12 lbs - started Rickard Patience over the past month.   DM - followed by [name] - sees regularly Q6 mo, next appt tomorrow. On tresiba 60u daily. Fasting cbg this morning 84.   Depression - on cymbalta and levothyroxine.   RLS - Doing better on 2mg  mirapex nightly. Insomnia - takes trazodone 50mg  nightly PRN insomnia, not regularly using.   HTN - chronic, stable. HLD - continues lipitor 40mg  daily and fish oil 2gm bid.  S/p CVA - continues quad cane use. Has had a few falls in the past few months. No injury. Has been set up to return to rehab at [facility] and balance training. No dizziness or syncope or premonitory symptoms.   He has been taking gabapentin 300mg  BID and tolerating well.   Relevant past medical, surgical, family and social history reviewed and updated as indicated. Interim medical history since our last visit reviewed. Allergies and medications reviewed and updated. Current Outpatient Prescriptions on File Prior to Visit  Medication Sig  . clopidogrel (PLAVIX) 75 MG tablet TAKE 1 TABLET DAILY  . Coenzyme Q10 (COQ10) 150 MG CAPS Take 1 capsule by mouth 2 (two) times daily.  . diazepam (VALIUM) 5 MG tablet Take 1 tablet (5 mg total) by mouth every 8 (eight) hours as needed for muscle spasms.  . DULoxetine (CYMBALTA) 60 MG capsule Take 1 capsule (60 mg total) by mouth daily.  . Folic Acid-Vit Q000111Q 123456 (FOLBEE) 2.5-25-1 MG TABS tablet Take 1 tablet by mouth daily.  Marland Kitchen gabapentin (NEURONTIN) 300 MG capsule Take 1 capsule (300 mg total) by mouth 2 (two) times daily.  Marland Kitchen ibuprofen (ADVIL,MOTRIN) 200 MG tablet Take 400 mg by mouth every 6 (six) hours as needed for mild pain or moderate  pain.  . Insulin Degludec (TRESIBA FLEXTOUCH) 200 UNIT/ML SOPN Inject 60 Units into the skin at bedtime.   . Insulin Pen Needle (NOVOFINE) 30G X 8 MM MISC Use to inject insulin once daily dx:E11.40  . levothyroxine (LEVOTHROID) 25 MCG tablet Take 1 tablet (25 mcg total) by mouth daily before breakfast. Per endo [name]  . losartan (COZAAR) 25 MG tablet Take 1 tablet (25 mg total) by mouth daily.  . Melatonin 5 MG CAPS Take 1 capsule by mouth at bedtime.  . metoprolol tartrate (LOPRESSOR) 25 MG tablet Take 0.5 tablets (12.5 mg total) by mouth 2 (two) times daily.  Marland Kitchen NITROSTAT 0.4 MG SL tablet DISSOLVE 1 TABLET UNDER THE TONGUE EVERY 5 MINUTES AS NEEDED FOR CHEST PAIN  . omega-3 acid ethyl esters (LOVAZA) 1 G capsule TAKE 2 CAPSULES TWICE A DAY  . omeprazole (PRILOSEC) 40 MG capsule Take 1 capsule (40 mg total) by mouth daily.  . pramipexole (MIRAPEX) 1 MG tablet Take 2 tablets (2 mg total) by mouth at bedtime.  . vitamin C (ASCORBIC ACID) 500 MG tablet Take 1,000 mg by mouth daily.   Marland Kitchen atorvastatin (LIPITOR) 40 MG tablet TAKE 1 TABLET DAILY (Patient not taking: Reported on 02/28/2016)   No current facility-administered medications on file prior to visit.    Review of Systems Per HPI unless specifically indicated  in ROS section     Objective:    BP 126/64 mmHg  Pulse 72  Temp(Src) 97.3 F (36.3 C) (Oral)  Wt 190 lb (86.183 kg)  Wt Readings from Last 3 Encounters:  02/28/16 190 lb (86.183 kg)  10/25/15 202 lb 12 oz (91.967 kg)  07/22/15 186 lb (84.369 kg)    Physical Exam  Constitutional: He appears well-developed and well-nourished. No distress.  HENT:  Mouth/Throat: Oropharynx is clear and moist. No oropharyngeal exudate.  Cardiovascular: Normal rate, regular rhythm, normal heart sounds and intact distal pulses.   No murmur heard. Pulmonary/Chest: Effort normal and breath sounds normal. No respiratory distress. He has no wheezes. He has no rales.  Musculoskeletal: He exhibits no  edema.  Neurological:  R hemiparesis with some improvement noted - today able to shake hand  Skin: Skin is warm and dry. No rash noted.  Psychiatric: He has a normal mood and affect.  Nursing note and vitals reviewed.  Results for orders placed or performed in visit on 06/23/15  Renal function panel  Result Value Ref Range   Sodium 140 135 - 145 mEq/L   Potassium 3.7 3.5 - 5.1 mEq/L   Chloride 105 96 - 112 mEq/L   CO2 26 19 - 32 mEq/L   Calcium 9.5 8.4 - 10.5 mg/dL   Albumin 3.9 3.5 - 5.2 g/dL   BUN 24 (H) 6 - 23 mg/dL   Creatinine, Ser 1.38 0.40 - 1.50 mg/dL   Glucose, Bld 101 (H) 70 - 99 mg/dL   Phosphorus 4.6 2.3 - 4.6 mg/dL   GFR 52.43 (L) >60.00 mL/min  Hemoglobin A1c  Result Value Ref Range   Hgb A1c MFr Bld 7.3 (H) 4.6 - 6.5 %  Vit D  25 hydroxy (rtn osteoporosis monitoring)  Result Value Ref Range   VITD 50.43 30.00 - 100.00 ng/mL  LDL Cholesterol, Direct  Result Value Ref Range   Direct LDL 64.0 mg/dL   Lab Results  Component Value Date   TSH 2.07 06/25/2012    Lab Results  Component Value Date   CHOL 56 03/19/2015   HDL 16* 03/19/2015   LDLCALC 26 03/19/2015   LDLDIRECT 64.0 06/23/2015   TRIG 68 03/19/2015   CHOLHDL 3.5 03/19/2015       Assessment & Plan:   Problem List Items Addressed This Visit    Hemiparesis affecting right side as late effect of stroke ([place]) - Primary    Mild improvement noted - today better able to shake hand - pt proud of this.      Type 2 diabetes, controlled, with neuropathy ([place])    Continues tresiba 60u - has f/u with [name] later this month.  Check labs and will forward results to endo.      Relevant Orders   Hemoglobin A1c   HLD (hyperlipidemia)    Pt remains worried about lipitor side effects/adverse effects. Check FLP when returns fasting - then decide on dosing.       Relevant Orders   Lipid panel   Depression    Continue cymbalta and levothyroxine. Mood overall well controlled. On prn trazodone for  insomnia.      Relevant Medications   traZODone (DESYREL) 50 MG tablet   Other Relevant Orders   TSH   HTN (hypertension)    Chronic, stable. Continue current regimen.      Relevant Orders   Basic metabolic panel       Follow up plan: Return in about 6 months (  around 08/29/2016), or as needed, for medicare wellness visit.  [name], MD

## 2016-02-28 NOTE — Progress Notes (Signed)
Pre visit review using our clinic review tool, if applicable. No additional management support is needed unless otherwise documented below in the visit note. 

## 2016-02-28 NOTE — Assessment & Plan Note (Signed)
Pt remains worried about lipitor side effects/adverse effects. Check FLP when returns fasting - then decide on dosing.

## 2016-02-28 NOTE — Assessment & Plan Note (Signed)
Continue cymbalta and levothyroxine. Mood overall well controlled. On prn trazodone for insomnia.

## 2016-02-28 NOTE — Patient Instructions (Addendum)
You are doing well today. Return at your convenience for fasting labs. We will forward results to [name].  Return in 4 months for medicare wellness visit.  Good to see you today, call us with questions.

## 2016-02-28 NOTE — Assessment & Plan Note (Signed)
Continues tresiba 60u - has f/u with [name] later this month.  Check labs and will forward results to endo.

## 2016-02-28 NOTE — Assessment & Plan Note (Signed)
Chronic, stable. Continue current regimen. 

## 2016-02-28 NOTE — Assessment & Plan Note (Signed)
Mild improvement noted - today better able to shake hand - pt proud of this.

## 2016-02-29 DIAGNOSIS — G4733 Obstructive sleep apnea (adult) (pediatric): Secondary | ICD-10-CM | POA: Diagnosis not present

## 2016-02-29 DIAGNOSIS — K219 Gastro-esophageal reflux disease without esophagitis: Secondary | ICD-10-CM | POA: Diagnosis not present

## 2016-02-29 DIAGNOSIS — Z794 Long term (current) use of insulin: Secondary | ICD-10-CM | POA: Diagnosis not present

## 2016-02-29 DIAGNOSIS — E785 Hyperlipidemia, unspecified: Secondary | ICD-10-CM | POA: Diagnosis not present

## 2016-02-29 DIAGNOSIS — I129 Hypertensive chronic kidney disease with stage 1 through stage 4 chronic kidney disease, or unspecified chronic kidney disease: Secondary | ICD-10-CM | POA: Diagnosis not present

## 2016-02-29 DIAGNOSIS — Z0000 Encounter for general adult medical examination without abnormal findings: Secondary | ICD-10-CM | POA: Diagnosis not present

## 2016-02-29 DIAGNOSIS — E039 Hypothyroidism, unspecified: Secondary | ICD-10-CM | POA: Diagnosis not present

## 2016-02-29 DIAGNOSIS — E1142 Type 2 diabetes mellitus with diabetic polyneuropathy: Secondary | ICD-10-CM | POA: Diagnosis not present

## 2016-02-29 DIAGNOSIS — Z79899 Other long term (current) drug therapy: Secondary | ICD-10-CM | POA: Diagnosis not present

## 2016-02-29 DIAGNOSIS — D649 Anemia, unspecified: Secondary | ICD-10-CM | POA: Diagnosis not present

## 2016-02-29 DIAGNOSIS — N183 Chronic kidney disease, stage 3 (moderate): Secondary | ICD-10-CM | POA: Diagnosis not present

## 2016-02-29 DIAGNOSIS — I69320 Aphasia following cerebral infarction: Secondary | ICD-10-CM | POA: Diagnosis not present

## 2016-03-08 ENCOUNTER — Other Ambulatory Visit: Payer: Medicare Other

## 2016-03-10 ENCOUNTER — Ambulatory Visit (HOSPITAL_BASED_OUTPATIENT_CLINIC_OR_DEPARTMENT_OTHER): Payer: Medicare Other | Admitting: Physical Medicine & Rehabilitation

## 2016-03-10 ENCOUNTER — Other Ambulatory Visit (INDEPENDENT_AMBULATORY_CARE_PROVIDER_SITE_OTHER): Payer: Medicare Other

## 2016-03-10 ENCOUNTER — Encounter: Payer: Medicare Other | Attending: Physical Medicine & Rehabilitation

## 2016-03-10 ENCOUNTER — Encounter: Payer: Self-pay | Admitting: Physical Medicine & Rehabilitation

## 2016-03-10 VITALS — BP 103/47 | HR 71 | Resp 17

## 2016-03-10 DIAGNOSIS — M7989 Other specified soft tissue disorders: Secondary | ICD-10-CM | POA: Diagnosis not present

## 2016-03-10 DIAGNOSIS — G8111 Spastic hemiplegia affecting right dominant side: Secondary | ICD-10-CM

## 2016-03-10 DIAGNOSIS — I10 Essential (primary) hypertension: Secondary | ICD-10-CM

## 2016-03-10 DIAGNOSIS — F32A Depression, unspecified: Secondary | ICD-10-CM

## 2016-03-10 DIAGNOSIS — F329 Major depressive disorder, single episode, unspecified: Secondary | ICD-10-CM | POA: Diagnosis not present

## 2016-03-10 DIAGNOSIS — G8110 Spastic hemiplegia affecting unspecified side: Secondary | ICD-10-CM | POA: Diagnosis not present

## 2016-03-10 DIAGNOSIS — E785 Hyperlipidemia, unspecified: Secondary | ICD-10-CM | POA: Diagnosis not present

## 2016-03-10 DIAGNOSIS — E1140 Type 2 diabetes mellitus with diabetic neuropathy, unspecified: Secondary | ICD-10-CM

## 2016-03-10 LAB — LIPID PANEL
Cholesterol: 121 mg/dL (ref 0–200)
HDL: 17.8 mg/dL — ABNORMAL LOW (ref >39.00)
LDL Cholesterol: 76 mg/dL (ref 0–99)
NonHDL: 103.55
Total CHOL/HDL Ratio: 7
Triglycerides: 136 mg/dL (ref 0.0–149.0)
VLDL: 27.2 mg/dL (ref 0.0–40.0)

## 2016-03-10 LAB — BASIC METABOLIC PANEL
BUN: 25 mg/dL — ABNORMAL HIGH (ref 6–23)
CO2: 28 mEq/L (ref 19–32)
Calcium: 9.2 mg/dL (ref 8.4–10.5)
Chloride: 104 mEq/L (ref 96–112)
Creatinine, Ser: 1.31 mg/dL (ref 0.40–1.50)
GFR: 55.58 mL/min — ABNORMAL LOW (ref >60.00)
Glucose, Bld: 155 mg/dL — ABNORMAL HIGH (ref 70–99)
Potassium: 4.2 mEq/L (ref 3.5–5.1)
Sodium: 137 mEq/L (ref 135–145)

## 2016-03-10 LAB — HEMOGLOBIN A1C: Hgb A1c MFr Bld: 8.1 % — ABNORMAL HIGH (ref 4.6–6.5)

## 2016-03-10 LAB — TSH: TSH: 1.79 u[IU]/mL (ref 0.35–4.50)

## 2016-03-10 NOTE — Progress Notes (Signed)
Botox Injection for spasticity using needle EMG guidance  Dilution: 50 Units/ml Indication: Severe spasticity which interferes with ADL,mobility and/or  hygiene and is unresponsive to medication management and other conservative care Informed consent was obtained after describing risks and benefits of the procedure with the patient. This includes bleeding, bruising, infection, excessive weakness, or medication side effects. A REMS form is on file and signed. Needle: 27g 1" needle electrode Number of units per muscle  Biceps100 FCR25 FCU0 FDS25  Lumbrical 12 and 3, 12.5 Units each=37.5 FDP with estim to Identify DIP flexion digits 2 and 3, 12.5 units injected All injections were done after obtaining appropriate EMG activity and after negative drawback for blood. The patient tolerated the procedure well. Post procedure instructions were given. A followup appointment was made.

## 2016-03-10 NOTE — Patient Instructions (Signed)

## 2016-03-23 ENCOUNTER — Other Ambulatory Visit: Payer: Self-pay

## 2016-03-23 MED ORDER — OMEPRAZOLE 40 MG PO CPDR: 40.0000 mg | DELAYED_RELEASE_CAPSULE | Freq: Every day | ORAL | Status: DC

## 2016-03-23 NOTE — Telephone Encounter (Signed)
Butch Penny with [facility] request 1 week refill on duloxetine, gabapentin and mirapex and omeprazole (omeprazole done); pt forgot his med before going on vacation. Pt last seen 02/28/16.

## 2016-03-24 MED ORDER — PRAMIPEXOLE DIHYDROCHLORIDE 1 MG PO TABS: 2.0000 mg | ORAL_TABLET | Freq: Every day | ORAL | Status: DC

## 2016-03-24 MED ORDER — DULOXETINE HCL 60 MG PO CPEP: 60.0000 mg | ORAL_CAPSULE | Freq: Every day | ORAL | Status: DC

## 2016-03-24 MED ORDER — GABAPENTIN 300 MG PO CAPS: 300.0000 mg | ORAL_CAPSULE | Freq: Two times a day (BID) | ORAL | Status: DC

## 2016-04-19 DIAGNOSIS — E559 Vitamin D deficiency, unspecified: Secondary | ICD-10-CM | POA: Diagnosis not present

## 2016-04-19 DIAGNOSIS — D509 Iron deficiency anemia, unspecified: Secondary | ICD-10-CM | POA: Diagnosis not present

## 2016-04-19 DIAGNOSIS — Z794 Long term (current) use of insulin: Secondary | ICD-10-CM | POA: Diagnosis not present

## 2016-04-19 DIAGNOSIS — E1142 Type 2 diabetes mellitus with diabetic polyneuropathy: Secondary | ICD-10-CM | POA: Diagnosis not present

## 2016-04-19 DIAGNOSIS — Z79899 Other long term (current) drug therapy: Secondary | ICD-10-CM | POA: Diagnosis not present

## 2016-04-19 DIAGNOSIS — R634 Abnormal weight loss: Secondary | ICD-10-CM | POA: Diagnosis not present

## 2016-04-19 DIAGNOSIS — K219 Gastro-esophageal reflux disease without esophagitis: Secondary | ICD-10-CM | POA: Diagnosis not present

## 2016-04-19 DIAGNOSIS — Z86010 Personal history of colonic polyps: Secondary | ICD-10-CM | POA: Diagnosis not present

## 2016-04-19 DIAGNOSIS — N183 Chronic kidney disease, stage 3 (moderate): Secondary | ICD-10-CM | POA: Diagnosis not present

## 2016-04-19 DIAGNOSIS — G629 Polyneuropathy, unspecified: Secondary | ICD-10-CM | POA: Diagnosis not present

## 2016-04-24 ENCOUNTER — Encounter: Payer: Self-pay | Admitting: Physical Medicine & Rehabilitation

## 2016-04-24 ENCOUNTER — Encounter: Payer: Medicare Other | Attending: Physical Medicine & Rehabilitation

## 2016-04-24 ENCOUNTER — Ambulatory Visit (HOSPITAL_BASED_OUTPATIENT_CLINIC_OR_DEPARTMENT_OTHER): Payer: Medicare Other | Admitting: Physical Medicine & Rehabilitation

## 2016-04-24 VITALS — BP 102/57 | HR 60 | Resp 14

## 2016-04-24 DIAGNOSIS — M7989 Other specified soft tissue disorders: Secondary | ICD-10-CM | POA: Insufficient documentation

## 2016-04-24 DIAGNOSIS — G8110 Spastic hemiplegia affecting unspecified side: Secondary | ICD-10-CM

## 2016-04-24 NOTE — Patient Instructions (Signed)
[facility] Dept of Physical therapy Outpatient student clinic

## 2016-04-24 NOTE — Progress Notes (Signed)
Subjective:    Patient ID: James Bell, male    DOB: 01-20-1931, 82 y.o.   MRN: [id number] Botox 6 weeks ago Biceps100 FCR25 FCU0 FDS25  Lumbrical 12 and 3, 12.5 Units each=37.5 FDP with estim to Identify DIP flexion digits 2 and 3, 12.5 units injected HPI  Feels a little weaker and poor balance No new falls Has dieted and lost 15lbs Quite satisfied with Botox injections.   Pain Inventory Average Pain 0 Pain Right Now 0 My pain is no pain  In the last 24 hours, has pain interfered with the following? General activity 0 Relation with others 0 Enjoyment of life 0 What TIME of day is your pain at its worst? no pain Sleep (in general) Fair  Pain is worse with: no pain Pain improves with: no pain Relief from Meds: no pain  Mobility walk with assistance use a cane Do you have any goals in this area?  no  Function retired Do you have any goals in this area?  no  Neuro/Psych No problems in this area  Prior Studies Any changes since last visit?  no  Physicians involved in your care Any changes since last visit?  no   Family History  Problem Relation Age of Onset  . Stroke Father   . Lung cancer Father 54     smoker  . Stroke Brother   . Diabetes Brother 60  . Lung cancer Brother   . Alcoholism Sister   . Kidney disease Sister   . Coronary artery disease Neg Hx    Social History   Social History  . Marital status: Unknown    Spouse name: N/A  . Number of children: 2  . Years of education: MD   Occupational History  . retired   .  Retired   Social History Main Topics  . Smoking status: Never Smoker  . Smokeless tobacco: Never Used     Comment: "used to smoke pipe when I was younger"  . Alcohol use 2.4 oz/week    2 Glasses of wine, 2 Shots of liquor per week     Comment: occasional  . Drug use: No  . Sexual activity: Yes   Other Topics Concern  . None   Social History Narrative   Caffeine: 1 cup coffee/day    Occupation:  ophthalmologist, retired   Edu: MD   Activity: going to gym 3x/wk with trainer   Diet: some water, fruits/vegetables some, red meat a few times, fish 1x/wk   POA - [name] (Daughter)   Past Surgical History:  Procedure Laterality Date  . ABI  2007   WNL  . CARDIAC CATHETERIZATION  2004   normal; [place]  . CARDIOVASCULAR STRESS TEST  02/2014   WNL, low risk scan, EF 68%  . CAROTID ENDARTERECTOMY    . CATARACT EXTRACTION W/ INTRAOCULAR LENS IMPLANT  11//2012 - 08/2011  . COLONOSCOPY  03/2011   poor prep, unable to biopsy polyp in tic fold, rpt 3 mo [name])  . COLONOSCOPY  06/2011   mult polyps adenomatous, severe diverticulosis, rpt 3 yrs [name])  . CYSTOSCOPY WITH URETHRAL DILATATION N/A 07/24/2013   Procedure: CYSTOSCOPY WITH URETHRAL DILATATION;  Surgeon: [name], MD;  Location: [facility];  Service: Urology;  Laterality: N/A;  . ESOPHAGOGASTRODUODENOSCOPY  2006   duodenitis, medual HH, Grade 2 reflux, mild gastritis  . ESOPHAGOGASTRODUODENOSCOPY  07/2011   mild chronic gastritis  . [place]  . INGUINAL HERNIA  REPAIR  1981; 2002   left, then repaired  . MRI brain  04/2010   WNL  . PROSTATE SURGERY  02/2009   PVP (laser)  . SKIN CANCER EXCISION  2002   bridge of nose, BCC  . TONSILLECTOMY AND ADENOIDECTOMY  ~ 1945  . TRANSURETHRAL INCISION OF PROSTATE N/A 07/24/2013   Procedure: TRANSURETHRAL INCISION OF THE PROSTATE (TUIP);  Surgeon: [name], MD;  Location: [facility];  Service: Urology;  Laterality: N/A;  . URETHRAL DILATION  2014   [place]   Past Medical History:  Diagnosis Date  . Anemia   . Basal cell carcinoma of nose 2002   bridge of nose  . BPH with obstruction/lower urinary tract symptoms    failed flomax, uroxatral, myrbetriq - sees urology [name]) pending videourodynamics  . CKD (chronic kidney disease) stage 3, GFR 30-59 ml/min 03/11/2012   Renal US 02/2014 - multiple kidney cysts   . CVA (cerebral infarction)  03/07/12   slurred speech; right hemplegia, left handed - completed PT 07/2014 (17 sessions)  . Depression    mild, on cymbalta per prior PCP  . Erectile dysfunction    per urology (prostaglandin injection)  . GERD (gastroesophageal reflux disease)    with sliding HH  . H/O hiatal hernia   . History of kidney problems 12/2010   lacerated left kidney after fall  . HLD (hyperlipidemia)   . Kidney cysts 02/2014   bilateral (renal US)  . Migraines 03/11/12   now rare  . OSA on CPAP    sleep study 01/2014 - severe, CPAP at 10, previously on nuvigil (Dr. [name])  . Palpitations 01/2012   holter WNL, rare PAC/PVCs  . Presbycusis of both ears 2016   rec amplification [facility])  . Restless leg syndrome    well controlled on mirapex  . TIA (transient ischemic attack)   . Type 2 diabetes, controlled, with neuropathy [facility]) 2011   [name] in [place]   There were no vitals taken for this visit.  Opioid Risk Score:   Fall Risk Score:  `1  Depression screen PHQ 2/9  Depression screen [facility] 2/9 01/17/2016 10/05/2015 06/23/2015 04/08/2015 11/06/2013  Decreased Interest 0 0 0 0 1  Down, Depressed, Hopeless 0 0 0 0 1  PHQ - 2 Score 0 0 0 0 2  Altered sleeping - - - - 3  Tired, decreased energy - - - - 1  Change in appetite - - - - 1  Feeling bad or failure about yourself  - - - - 0  Trouble concentrating - - - - 1  Moving slowly or fidgety/restless - - - - 0  Suicidal thoughts - - - - 0  PHQ-9 Score - - - - 8  Some recent data might be hidden    Review of Systems  Constitutional: Negative.   HENT: Negative.   Eyes: Negative.   Respiratory: Negative.   Cardiovascular: Negative.   Gastrointestinal: Negative.   Endocrine: Negative.   Genitourinary: Negative.   Musculoskeletal: Negative.   Skin: Negative.   Allergic/Immunologic: Negative.   Neurological: Negative.   Hematological: Negative.   All other systems reviewed and are negative.      Objective:   Physical Exam    Constitutional: He is oriented to person, place, and time. He appears well-developed and well-nourished.  HENT:  Head: Normocephalic and atraumatic.  Eyes: Conjunctivae and EOM are normal. Pupils are equal, round, and reactive to light.  Neck: Normal range of  motion.  Pulmonary/Chest: No respiratory distress.  Neurological: He is alert and oriented to person, place, and time. He exhibits abnormal muscle tone. Gait abnormal.  Motor strength is 3 minus at the right deltoid, biceps, triceps, finger flexors and extensors.  Ambulates with quad cane. No evidence of toe drag or knee instability.  Psychiatric: He has a normal mood and affect. His behavior is normal. Judgment and thought content normal.  Nursing note and vitals reviewed.   Elbow flexors, Ashworth 1 Lumbricals, Ashworth 2 Finger flexors, Ashworth 1 Wrist flexors, Ashworth 0.      Assessment & Plan:  1. Right spastic hemiplegia due to left cerebral infarct. Patient has made good functional improvement over time. His spasticity has improved with Botox injections. Current injection dosing and muscle selection appeared to be optimal. Repeat in 6 weeks.  His examination has not deteriorated despite his complaints of feeling a bit weak. I do think he would benefit from outpatient PT and have referred him to the [facility] physical therapy clinic

## 2016-04-26 DIAGNOSIS — D500 Iron deficiency anemia secondary to blood loss (chronic): Secondary | ICD-10-CM | POA: Diagnosis not present

## 2016-04-27 ENCOUNTER — Other Ambulatory Visit: Payer: Self-pay

## 2016-04-27 MED ORDER — PRAMIPEXOLE DIHYDROCHLORIDE 1 MG PO TABS: 2.0000 mg | ORAL_TABLET | Freq: Every day | ORAL | 3 refills | Status: DC

## 2016-04-27 NOTE — Telephone Encounter (Signed)
Marissa with [facility] left v/m requesting pramipexole 1 mg refill and direction change from 2 tabs at 5:30 pm to 2 tabs at 3:00 pm. Med list has instructions 2 tabs at hs. Last refilled to mail order # 180 x 3 on 06/23/2015. Last refilled # 14 x 0 [facility] on 03/24/16. Pt last seen 02/28/16.Please advise.

## 2016-04-27 NOTE — Telephone Encounter (Signed)
Sent in

## 2016-04-28 ENCOUNTER — Ambulatory Visit: Payer: Medicare Other | Admitting: Physical Medicine & Rehabilitation

## 2016-05-12 DIAGNOSIS — Z86010 Personal history of colonic polyps: Secondary | ICD-10-CM | POA: Diagnosis not present

## 2016-05-12 DIAGNOSIS — D500 Iron deficiency anemia secondary to blood loss (chronic): Secondary | ICD-10-CM | POA: Diagnosis not present

## 2016-05-15 ENCOUNTER — Telehealth: Payer: Self-pay | Admitting: Neurology

## 2016-05-15 ENCOUNTER — Telehealth: Payer: Self-pay

## 2016-05-15 NOTE — Telephone Encounter (Signed)
James Bell with [facility] left v/m; pt is scheduled for endoscopy/colonoscopy on 05/23/2016. Pt is taking Plavix and request from Dr Darnell Level OK to stop Plavix 5 days prior to procedure. James Bell request cb.

## 2016-05-15 NOTE — Telephone Encounter (Signed)
error 

## 2016-05-16 NOTE — Telephone Encounter (Signed)
[facility] cb to see if can hold plavix; Barbera Setters advised per phone note Dr Synthia Innocent instruction and Sadie Haber voiced understanding.

## 2016-05-16 NOTE — Telephone Encounter (Signed)
On plavix for h/o CVA 2013. Ok to hold plavix for 5 d prior to procedure, restart night of procedure if possible.

## 2016-05-23 DIAGNOSIS — D509 Iron deficiency anemia, unspecified: Secondary | ICD-10-CM | POA: Diagnosis not present

## 2016-05-23 DIAGNOSIS — K317 Polyp of stomach and duodenum: Secondary | ICD-10-CM | POA: Diagnosis not present

## 2016-05-23 DIAGNOSIS — Z86010 Personal history of colonic polyps: Secondary | ICD-10-CM | POA: Diagnosis not present

## 2016-06-12 ENCOUNTER — Encounter: Payer: Medicare Other | Attending: Physical Medicine & Rehabilitation

## 2016-06-12 ENCOUNTER — Ambulatory Visit (HOSPITAL_BASED_OUTPATIENT_CLINIC_OR_DEPARTMENT_OTHER): Payer: Medicare Other | Admitting: Physical Medicine & Rehabilitation

## 2016-06-12 ENCOUNTER — Encounter: Payer: Self-pay | Admitting: Physical Medicine & Rehabilitation

## 2016-06-12 VITALS — BP 108/56 | HR 72 | Resp 14 | Wt 188.6 lb

## 2016-06-12 DIAGNOSIS — M7989 Other specified soft tissue disorders: Secondary | ICD-10-CM | POA: Insufficient documentation

## 2016-06-12 DIAGNOSIS — G8110 Spastic hemiplegia affecting unspecified side: Secondary | ICD-10-CM | POA: Diagnosis not present

## 2016-06-12 DIAGNOSIS — G8111 Spastic hemiplegia affecting right dominant side: Secondary | ICD-10-CM

## 2016-06-12 DIAGNOSIS — I69351 Hemiplegia and hemiparesis following cerebral infarction affecting right dominant side: Secondary | ICD-10-CM

## 2016-06-12 NOTE — Patient Instructions (Signed)

## 2016-06-12 NOTE — Progress Notes (Signed)
Botox Injection for spasticity using needle EMG guidance  Dilution: 50 Units/ml Indication: Severe spasticity which interferes with ADL,mobility and/or  hygiene and is unresponsive to medication management and other conservative care Informed consent was obtained after describing risks and benefits of the procedure with the patient. This includes bleeding, bruising, infection, excessive weakness, or medication side effects. A REMS form is on file and signed. Needle: 27g 1" needle electrode Number of units per muscle Biceps100 FCR25 FCU0 FDS25  Lumbrical 12 and 3, 12.5 Units each=37.5 FDP with estim to Identify DIP flexion digits 2 and 3, 12.5 units injected All injections were done after obtaining appropriate EMG activity and after negative drawback for blood. The patient tolerated the procedure well. Post procedure instructions were given. A followup appointment was made.

## 2016-06-13 ENCOUNTER — Other Ambulatory Visit: Payer: Self-pay | Admitting: Family Medicine

## 2016-06-13 DIAGNOSIS — H04203 Unspecified epiphora, bilateral lacrimal glands: Secondary | ICD-10-CM | POA: Diagnosis not present

## 2016-06-26 ENCOUNTER — Encounter: Payer: Self-pay | Admitting: Neurology

## 2016-06-27 ENCOUNTER — Other Ambulatory Visit: Payer: Self-pay | Admitting: Gastroenterology

## 2016-06-27 DIAGNOSIS — Z86010 Personal history of colonic polyps: Secondary | ICD-10-CM

## 2016-06-27 DIAGNOSIS — D509 Iron deficiency anemia, unspecified: Secondary | ICD-10-CM

## 2016-06-28 ENCOUNTER — Other Ambulatory Visit: Payer: Self-pay | Admitting: *Deleted

## 2016-06-28 MED ORDER — CLOPIDOGREL BISULFATE 75 MG PO TABS: 75.0000 mg | ORAL_TABLET | Freq: Every day | ORAL | 1 refills | Status: DC

## 2016-06-28 MED ORDER — METOPROLOL TARTRATE 25 MG PO TABS: 12.5000 mg | ORAL_TABLET | Freq: Two times a day (BID) | ORAL | 1 refills | Status: DC

## 2016-06-28 MED ORDER — OMEPRAZOLE 40 MG PO CPDR: 40.0000 mg | DELAYED_RELEASE_CAPSULE | Freq: Every day | ORAL | 1 refills | Status: DC

## 2016-06-28 MED ORDER — FOLIC ACID-VIT B6-VIT B12 2.5-25-1 MG PO TABS: 1.0000 | ORAL_TABLET | Freq: Every day | ORAL | 1 refills | Status: DC

## 2016-06-28 MED ORDER — ROSUVASTATIN CALCIUM 40 MG PO TABS: 40.0000 mg | ORAL_TABLET | Freq: Every day | ORAL | 1 refills | Status: DC

## 2016-06-28 MED ORDER — DULOXETINE HCL 60 MG PO CPEP: 60.0000 mg | ORAL_CAPSULE | Freq: Every day | ORAL | 1 refills | Status: DC

## 2016-06-28 MED ORDER — GABAPENTIN 300 MG PO CAPS: 300.0000 mg | ORAL_CAPSULE | Freq: Two times a day (BID) | ORAL | 1 refills | Status: DC

## 2016-06-28 MED ORDER — OMEGA-3-ACID ETHYL ESTERS 1 G PO CAPS: 2.0000 | ORAL_CAPSULE | Freq: Two times a day (BID) | ORAL | 1 refills | Status: DC

## 2016-06-28 NOTE — Telephone Encounter (Signed)
Ok to refill to [facility]? Mirapex last sent to [facility] 04/27/16 #180 3 RF and Diazepam 02/09/16 #90 0RF.   Will need written Rx for both to fax.

## 2016-06-29 MED ORDER — DIAZEPAM 5 MG PO TABS: 5.0000 mg | ORAL_TABLET | Freq: Three times a day (TID) | ORAL | 0 refills | Status: DC | PRN

## 2016-06-29 MED ORDER — PRAMIPEXOLE DIHYDROCHLORIDE 1 MG PO TABS: 2.0000 mg | ORAL_TABLET | Freq: Every day | ORAL | 1 refills | Status: DC

## 2016-06-29 NOTE — Telephone Encounter (Signed)
Printed and in Kim's box 

## 2016-06-29 NOTE — Addendum Note (Signed)
Addended by: [name] on: 06/29/2016 09:50 AM   Modules accepted: Orders

## 2016-06-30 ENCOUNTER — Ambulatory Visit: Payer: Medicare Other

## 2016-06-30 NOTE — Telephone Encounter (Signed)
Rx's faxed to pharmacy

## 2016-07-03 ENCOUNTER — Other Ambulatory Visit: Payer: Medicare Other

## 2016-07-05 ENCOUNTER — Ambulatory Visit: Payer: Medicare Other | Admitting: Family Medicine

## 2016-07-06 ENCOUNTER — Telehealth: Payer: Self-pay | Admitting: *Deleted

## 2016-07-06 NOTE — Telephone Encounter (Signed)
signed and in kim's box

## 2016-07-06 NOTE — Telephone Encounter (Signed)
Form for diabetic supplies in your IN box.

## 2016-07-06 NOTE — Telephone Encounter (Signed)
Form faxed

## 2016-07-11 ENCOUNTER — Ambulatory Visit: Payer: Medicare Other | Admitting: Family Medicine

## 2016-07-16 ENCOUNTER — Other Ambulatory Visit: Payer: Self-pay | Admitting: Family Medicine

## 2016-07-16 DIAGNOSIS — N183 Chronic kidney disease, stage 3 unspecified: Secondary | ICD-10-CM

## 2016-07-16 DIAGNOSIS — E1140 Type 2 diabetes mellitus with diabetic neuropathy, unspecified: Secondary | ICD-10-CM

## 2016-07-17 ENCOUNTER — Ambulatory Visit (INDEPENDENT_AMBULATORY_CARE_PROVIDER_SITE_OTHER): Payer: Medicare Other

## 2016-07-17 VITALS — BP 98/60 | HR 79 | Temp 97.9°F | Ht 68.0 in | Wt 194.0 lb

## 2016-07-17 DIAGNOSIS — Z0000 Encounter for general adult medical examination without abnormal findings: Secondary | ICD-10-CM

## 2016-07-17 DIAGNOSIS — N183 Chronic kidney disease, stage 3 unspecified: Secondary | ICD-10-CM

## 2016-07-17 DIAGNOSIS — Z23 Encounter for immunization: Secondary | ICD-10-CM

## 2016-07-17 DIAGNOSIS — E1140 Type 2 diabetes mellitus with diabetic neuropathy, unspecified: Secondary | ICD-10-CM

## 2016-07-17 LAB — CBC WITH DIFFERENTIAL/PLATELET
Basophils Absolute: 0 10*3/uL (ref 0.0–0.1)
Basophils Relative: 0.4 % (ref 0.0–3.0)
Eosinophils Absolute: 0.2 10*3/uL (ref 0.0–0.7)
Eosinophils Relative: 1.9 % (ref 0.0–5.0)
HCT: 37.5 % — ABNORMAL LOW (ref 39.0–52.0)
Hemoglobin: 12.8 g/dL — ABNORMAL LOW (ref 13.0–17.0)
Lymphocytes Relative: 22.2 % (ref 12.0–46.0)
Lymphs Abs: 2.1 10*3/uL (ref 0.7–4.0)
MCHC: 34.3 g/dL (ref 30.0–36.0)
MCV: 84.6 fl (ref 78.0–100.0)
Monocytes Absolute: 0.8 10*3/uL (ref 0.1–1.0)
Monocytes Relative: 8.2 % (ref 3.0–12.0)
Neutro Abs: 6.4 10*3/uL (ref 1.4–7.7)
Neutrophils Relative %: 67.3 % (ref 43.0–77.0)
Platelets: 198 10*3/uL (ref 150.0–400.0)
RBC: 4.43 Mil/uL (ref 4.22–5.81)
RDW: 14.1 % (ref 11.5–15.5)
WBC: 9.5 10*3/uL (ref 4.0–10.5)

## 2016-07-17 LAB — RENAL FUNCTION PANEL
Albumin: 3.8 g/dL (ref 3.5–5.2)
BUN: 26 mg/dL — ABNORMAL HIGH (ref 6–23)
CO2: 27 mEq/L (ref 19–32)
Calcium: 9.2 mg/dL (ref 8.4–10.5)
Chloride: 103 mEq/L (ref 96–112)
Creatinine, Ser: 1.31 mg/dL (ref 0.40–1.50)
GFR: 55.53 mL/min — ABNORMAL LOW (ref >60.00)
Glucose, Bld: 106 mg/dL — ABNORMAL HIGH (ref 70–99)
Phosphorus: 4.4 mg/dL (ref 2.3–4.6)
Potassium: 3.7 mEq/L (ref 3.5–5.1)
Sodium: 138 mEq/L (ref 135–145)

## 2016-07-17 LAB — HEMOGLOBIN A1C: Hgb A1c MFr Bld: 7.7 % — ABNORMAL HIGH (ref 4.6–6.5)

## 2016-07-17 NOTE — Progress Notes (Signed)
PCP notes:   Health maintenance:  Tetanus vaccine - administered Flu vaccine - administered Eye exam - per pt, completed on 06/29/2016 Foot exam - per pt, completed in June 2017 by Dr. [name]  Abnormal screenings:   Fall risk - hx of multiple falls without injury (1st fall occurred when pt tripped over ledge leading into door of home; 2nd fall occurred when pt slipped on soapy bottom of bathtub) Mini-Cog score: 19/20  Patient concerns:   None  Nurse concerns:  None  Next PCP appt:   07/28/16 @ 1000

## 2016-07-17 NOTE — Patient Instructions (Signed)
James Bell , Thank you for taking time to come for your Medicare Wellness Visit. I appreciate your ongoing commitment to your health goals. Please review the following plan we discussed and let me know if I can assist you in the future.   These are the goals we discussed: Goals    . Increase physical activity          Starting 07/17/2016, I will continue to exercise at least 20 min 4 days per week.        This is a list of the screening recommended for you and due dates:  Health Maintenance  Topic Date Due  . DTaP/Tdap/Td vaccine (1 - Tdap) 07/17/2026*  . Hemoglobin A1C  09/09/2016  . Complete foot exam   02/16/2017  . Eye exam for diabetics  06/29/2017  . Tetanus Vaccine  07/17/2026  . Flu Shot  Completed  . Shingles Vaccine  Addressed  . Pneumonia vaccines  Completed  *Topic was postponed. The date shown is not the original due date.   Preventive Care for Adults  A healthy lifestyle and preventive care can promote health and wellness. Preventive health guidelines for adults include the following key practices.  . A routine yearly physical is a good way to check with your health care provider about your health and preventive screening. It is a chance to share any concerns and updates on your health and to receive a thorough exam.  . Visit your dentist for a routine exam and preventive care every 6 months. Brush your teeth twice a day and floss once a day. Good oral hygiene prevents tooth decay and gum disease.  . The frequency of eye exams is based on your age, health, family medical history, use  of contact lenses, and other factors. Follow your health care provider's ecommendations for frequency of eye exams.  . Eat a healthy diet. Foods like vegetables, fruits, whole grains, low-fat dairy products, and lean protein foods contain the nutrients you need without too many calories. Decrease your intake of foods high in solid fats, added sugars, and salt. Eat the right amount of  calories for you. Get information about a proper diet from your health care provider, if necessary.  . Regular physical exercise is one of the most important things you can do for your health. Most adults should get at least 150 minutes of moderate-intensity exercise (any activity that increases your heart rate and causes you to sweat) each week. In addition, most adults need muscle-strengthening exercises on 2 or more days a week.  Silver Sneakers may be a benefit available to you. To determine eligibility, you may visit the website: www.silversneakers.com or contact program at (207) 610-2937 Mon-Fri between 8AM-8PM.   . Maintain a healthy weight. The body mass index (BMI) is a screening tool to identify possible weight problems. It provides an estimate of body fat based on height and weight. Your health care provider can find your BMI and can help you achieve or maintain a healthy weight.   For adults 20 years and older: ? A BMI below 18.5 is considered underweight. ? A BMI of 18.5 to 24.9 is normal. ? A BMI of 25 to 29.9 is considered overweight. ? A BMI of 30 and above is considered obese.   . Maintain normal blood lipids and cholesterol levels by exercising and minimizing your intake of saturated fat. Eat a balanced diet with plenty of fruit and vegetables. Blood tests for lipids and cholesterol should begin at age 42  and be repeated every 5 years. If your lipid or cholesterol levels are high, you are over 50, or you are at high risk for heart disease, you may need your cholesterol levels checked more frequently. Ongoing high lipid and cholesterol levels should be treated with medicines if diet and exercise are not working.  . If you smoke, find out from your health care provider how to quit. If you do not use tobacco, please do not start.  . If you choose to drink alcohol, please do not consume more than 2 drinks per day. One drink is considered to be 12 ounces (355 mL) of beer, 5 ounces  (148 mL) of wine, or 1.5 ounces (44 mL) of liquor.  . If you are 82-78 years old, ask your health care provider if you should take aspirin to prevent strokes.  . Use sunscreen. Apply sunscreen liberally and repeatedly throughout the day. You should seek shade when your shadow is shorter than you. Protect yourself by wearing long sleeves, pants, a wide-brimmed hat, and sunglasses year round, whenever you are outdoors.  . Once a month, do a whole body skin exam, using a mirror to look at the skin on your back. Tell your health care provider of new moles, moles that have irregular borders, moles that are larger than a pencil eraser, or moles that have changed in shape or color.

## 2016-07-17 NOTE — Progress Notes (Signed)
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  Review of Systems:  N/A Cardiac Risk Factors include: advanced age (>36men, >43 women);male gender;diabetes mellitus;dyslipidemia;hypertension     Objective:    Vitals: BP 98/60 (BP Location: Left Arm, Patient Position: Sitting, Cuff Size: Normal)   Pulse 79   Temp 97.9 F (36.6 C) (Oral)   Ht 5\' 8"  (1.727 m) Comment: shoes  Wt 194 lb (88 kg)   SpO2 95%   BMI 29.50 kg/m   Body mass index is 29.5 kg/m.  Tobacco History  Smoking Status  . Never Smoker  Smokeless Tobacco  . Never Used    Comment: "used to smoke pipe when I was younger"     Counseling given: No   Past Medical History:  Diagnosis Date  . Anemia   . Basal cell carcinoma of nose 2002   bridge of nose  . BPH with obstruction/lower urinary tract symptoms    failed flomax, uroxatral, myrbetriq - sees urology [name]) pending videourodynamics  . CKD (chronic kidney disease) stage 3, GFR 30-59 ml/min 03/11/2012   Renal US 02/2014 - multiple kidney cysts   . CVA (cerebral infarction) 03/07/12   slurred speech; right hemplegia, left handed - completed PT 07/2014 (17 sessions)  . Depression    mild, on cymbalta per prior PCP  . Erectile dysfunction    per urology (prostaglandin injection)  . GERD (gastroesophageal reflux disease)    with sliding HH  . H/O hiatal hernia   . History of kidney problems 12/2010   lacerated left kidney after fall  . HLD (hyperlipidemia)   . Kidney cysts 02/2014   bilateral (renal US)  . Migraines 03/11/12   now rare  . OSA on CPAP    sleep study 01/2014 - severe, CPAP at 10, previously on nuvigil (Dr. [name])  . Palpitations 01/2012   holter WNL, rare PAC/PVCs  . Presbycusis of both ears 2016   rec amplification [facility])  . Restless leg syndrome    well controlled on mirapex  . TIA (transient ischemic attack)   . Type 2 diabetes, controlled,  with neuropathy [facility]) 2011   [name] in [facility]   Past Surgical History:  Procedure Laterality Date  . ABI  2007   WNL  . CARDIAC CATHETERIZATION  2004   normal; [place]  . CARDIOVASCULAR STRESS TEST  02/2014   WNL, low risk scan, EF 68%  . CAROTID ENDARTERECTOMY    . CATARACT EXTRACTION W/ INTRAOCULAR LENS IMPLANT  11//2012 - 08/2011  . COLONOSCOPY  03/2011   poor prep, unable to biopsy polyp in tic fold, rpt 3 mo [name])  . COLONOSCOPY  06/2011   mult polyps adenomatous, severe diverticulosis, rpt 3 yrs [name])  . CYSTOSCOPY WITH URETHRAL DILATATION N/A 07/24/2013   Procedure: CYSTOSCOPY WITH URETHRAL DILATATION;  Surgeon: [name], MD;  Location: [facility];  Service: Urology;  Laterality: N/A;  . ESOPHAGOGASTRODUODENOSCOPY  2006   duodenitis, medual HH, Grade 2 reflux, mild gastritis  . ESOPHAGOGASTRODUODENOSCOPY  07/2011   mild chronic gastritis  . [place]  . [place]; 2002   left, then repaired  . MRI brain  04/2010   WNL  . PROSTATE SURGERY  02/2009   PVP (laser)  . SKIN CANCER EXCISION  2002   bridge of nose, [facility]  . TONSILLECTOMY AND ADENOIDECTOMY  ~ 1945  . TRANSURETHRAL INCISION  OF PROSTATE N/A 07/24/2013   Procedure: TRANSURETHRAL INCISION OF THE PROSTATE (TUIP);  Surgeon: [name], MD;  Location: [facility];  Service: Urology;  Laterality: N/A;  . URETHRAL DILATION  2014   [place]   Family History  Problem Relation Age of Onset  . Stroke Father   . Lung cancer Father 27     smoker  . Stroke Brother   . Diabetes Brother 73  . Lung cancer Brother   . Alcoholism Sister   . Kidney disease Sister   . Coronary artery disease Neg Hx    History  Sexual Activity  . Sexual activity: Yes    Outpatient Encounter Prescriptions as of 07/17/2016  Medication Sig  . clopidogrel (PLAVIX) 75 MG tablet Take 1 tablet (75 mg total) by mouth daily.  . Coenzyme Q10 (COQ10) 150 MG CAPS Take 1 capsule by mouth 2 (two) times  daily.  . DULoxetine (CYMBALTA) 60 MG capsule Take 1 capsule (60 mg total) by mouth daily.  . Folic Acid-Vit Q000111Q 123456 (FOLBEE) 2.5-25-1 MG TABS tablet Take 1 tablet by mouth daily.  Marland Kitchen gabapentin (NEURONTIN) 300 MG capsule Take 1 capsule (300 mg total) by mouth 2 (two) times daily.  Marland Kitchen ibuprofen (ADVIL,MOTRIN) 200 MG tablet Take 400 mg by mouth every 6 (six) hours as needed for mild pain or moderate pain.  . Insulin Degludec (TRESIBA FLEXTOUCH) 200 UNIT/ML SOPN Inject 60 Units into the skin at bedtime.   . Insulin Pen Needle (NOVOFINE) 30G X 8 MM MISC Use to inject insulin once daily dx:E11.40  . levothyroxine (LEVOTHROID) 25 MCG tablet Take 1 tablet (25 mcg total) by mouth daily before breakfast. Per endo [name]  . losartan (COZAAR) 25 MG tablet Take 1 tablet (25 mg total) by mouth daily.  . Melatonin 5 MG CAPS Take 1 capsule by mouth at bedtime.  . metoprolol tartrate (LOPRESSOR) 25 MG tablet Take 0.5 tablets (12.5 mg total) by mouth 2 (two) times daily.  Marland Kitchen NITROSTAT 0.4 MG SL tablet DISSOLVE 1 TABLET UNDER THE TONGUE EVERY 5 MINUTES AS NEEDED FOR CHEST PAIN  . omega-3 acid ethyl esters (LOVAZA) 1 g capsule Take 2 capsules (2 g total) by mouth 2 (two) times daily.  Marland Kitchen omeprazole (PRILOSEC) 40 MG capsule Take 1 capsule (40 mg total) by mouth daily.  . pramipexole (MIRAPEX) 1 MG tablet Take 2 tablets (2 mg total) by mouth daily. At 3pm  . rosuvastatin (CRESTOR) 40 MG tablet Take 1 tablet (40 mg total) by mouth daily.  . sitaGLIPtin-metformin (JANUMET) 50-1000 MG tablet Take 1 tablet by mouth 2 (two) times daily with a meal.  . traZODone (DESYREL) 50 MG tablet Take 50 mg by mouth at bedtime as needed for sleep.  . vitamin C (ASCORBIC ACID) 500 MG tablet Take 1,000 mg by mouth daily.   . diazepam (VALIUM) 5 MG tablet Take 1 tablet (5 mg total) by mouth every 8 (eight) hours as needed for muscle spasms. (Patient not taking: Reported on 07/17/2016)   No facility-administered encounter medications on  file as of 07/17/2016.     Activities of Daily Living In your present state of health, do you have any difficulty performing the following activities: 07/17/2016  Hearing? Y  Vision? N  Difficulty concentrating or making decisions? N  Walking or climbing stairs? Y  Dressing or bathing? N  Doing errands, shopping? N  Preparing Food and eating ? N  Using the Toilet? N  In the past six months, have you  accidently leaked urine? N  Do you have problems with loss of bowel control? N  Managing your Medications? N  Managing your Finances? N  Housekeeping or managing your Housekeeping? N  Some recent data might be hidden    Patient Care Team: [name], MD as PCP - General (Family Medicine) Delrae Rend, MD as Consulting Physician (Endocrinology)   Assessment:    Hearing Screening Comments: Wears bilateral hearing aids Vision Screening Comments: Last vision exam with Dr. Sandra Cockayne in June 2017  Exercise Activities and Dietary recommendations Current Exercise Habits: Home exercise routine, Type of exercise: Other - see comments;walking (elliptical), Time (Minutes): 20, Frequency (Times/Week): 4, Weekly Exercise (Minutes/Week): 80, Intensity: Mild, Exercise limited by: None identified  Goals    . Increase physical activity          Starting 07/17/2016, I will continue to exercise at least 20 min 4 days per week.       Fall Risk Fall Risk  07/17/2016 04/24/2016 03/10/2016 01/17/2016 11/09/2015  Falls in the past year? Yes Yes Yes Yes Yes  Number falls in past yr: 2 or more 2 or more 2 or more 2 or more 2 or more  Injury with Fall? No No No (No Data) Yes  Risk Factor Category  High Fall Risk High Fall Risk High Fall Risk High Fall Risk High Fall Risk  Risk for fall due to : Impaired balance/gait;Impaired mobility;History of fall(s) Impaired balance/gait - Impaired balance/gait;History of fall(s);Medication side effect Impaired balance/gait;Impaired mobility  Risk for fall due to  (comments): - - - - -  Follow up Falls evaluation completed;Falls prevention discussed - - Education provided;Falls prevention discussed -   Depression Screen PHQ 2/9 Scores 07/17/2016 01/17/2016 10/05/2015 06/23/2015  PHQ - 2 Score 0 0 0 0  PHQ- 9 Score - - - -    Cognitive Function MMSE - Mini Mental State Exam 07/17/2016  Orientation to time 5  Orientation to Place 5  Registration 3  Attention/ Calculation 0  Recall 2  Recall-comments pt was unable to recall 1 of 3 words  Language- name 2 objects 0  Language- repeat 1  Language- follow 3 step command 3  Language- read & follow direction 0  Write a sentence 0  Copy design 0  Total score 19     PLEASE NOTE: A Mini-Cog screen was completed. Maximum score is 20. A value of 0 denotes this part of Folstein MMSE was not completed or the patient failed this part of the Mini-Cog screening.   Mini-Cog Screening Orientation to Time - Max 5 pts Orientation to Place - Max 5 pts Registration - Max 3 pts Recall - Max 3 pts Language Repeat - Max 1 pts Language Follow 3 Step Command - Max 3 pts     Immunization History  Administered Date(s) Administered  . Influenza Split 06/25/2012  . Influenza,inj,Quad PF,36+ Mos 06/02/2013, 06/09/2014, 06/23/2015, 07/17/2016  . Pneumococcal Conjugate-13 11/06/2013  . Pneumococcal Polysaccharide-23 09/19/2007  . Td 07/17/2016  . Zoster 09/26/2007   Screening Tests Health Maintenance  Topic Date Due  . DTaP/Tdap/Td (1 - Tdap) 07/17/2026 (Originally 07/01/1950)  . HEMOGLOBIN A1C  09/09/2016  . FOOT EXAM  02/16/2017  . OPHTHALMOLOGY EXAM  06/29/2017  . TETANUS/TDAP  07/17/2026  . INFLUENZA VACCINE  Completed  . ZOSTAVAX  Addressed  . PNA vac Low Risk Adult  Completed      Plan:     I have personally reviewed and addressed the Medicare Annual Wellness questionnaire  and have noted the following in the patient's chart:  A. Medical and social history B. Use of alcohol, tobacco or illicit drugs   C. Current medications and supplements D. Functional ability and status E.  Nutritional status F.  Physical activity G. Advance directives H. List of other physicians I.  Hospitalizations, surgeries, and ER visits in previous 12 months J.  [place] to include hearing, vision, cognitive, depression L. Referrals and appointments - none  In addition, I have reviewed and discussed with patient certain preventive protocols, quality metrics, and best practice recommendations. A written personalized care plan for preventive services as well as general preventive health recommendations were provided to patient.  See attached scanned questionnaire for additional information.   Signed,   Lindell Noe, MHA, BS, LPN Health Coach

## 2016-07-17 NOTE — Progress Notes (Signed)
I reviewed health advisor's note, was available for consultation, and agree with documentation and plan.  Failed fall assessment

## 2016-07-17 NOTE — Progress Notes (Signed)
Pre visit review using our clinic review tool, if applicable. No additional management support is needed unless otherwise documented below in the visit note. 

## 2016-07-24 ENCOUNTER — Ambulatory Visit: Payer: Medicare Other | Admitting: Neurology

## 2016-07-28 ENCOUNTER — Ambulatory Visit (INDEPENDENT_AMBULATORY_CARE_PROVIDER_SITE_OTHER): Payer: Medicare Other | Admitting: Family Medicine

## 2016-07-28 ENCOUNTER — Encounter: Payer: Self-pay | Admitting: Family Medicine

## 2016-07-28 VITALS — BP 140/72 | HR 76 | Temp 97.5°F | Wt 194.0 lb

## 2016-07-28 DIAGNOSIS — G4733 Obstructive sleep apnea (adult) (pediatric): Secondary | ICD-10-CM

## 2016-07-28 DIAGNOSIS — Z9989 Dependence on other enabling machines and devices: Secondary | ICD-10-CM

## 2016-07-28 DIAGNOSIS — N183 Chronic kidney disease, stage 3 unspecified: Secondary | ICD-10-CM

## 2016-07-28 DIAGNOSIS — L989 Disorder of the skin and subcutaneous tissue, unspecified: Secondary | ICD-10-CM

## 2016-07-28 DIAGNOSIS — I69398 Other sequelae of cerebral infarction: Secondary | ICD-10-CM | POA: Diagnosis not present

## 2016-07-28 DIAGNOSIS — I69351 Hemiplegia and hemiparesis following cerebral infarction affecting right dominant side: Secondary | ICD-10-CM

## 2016-07-28 DIAGNOSIS — E1140 Type 2 diabetes mellitus with diabetic neuropathy, unspecified: Secondary | ICD-10-CM

## 2016-07-28 DIAGNOSIS — R279 Unspecified lack of coordination: Secondary | ICD-10-CM | POA: Diagnosis not present

## 2016-07-28 DIAGNOSIS — I639 Cerebral infarction, unspecified: Secondary | ICD-10-CM

## 2016-07-28 DIAGNOSIS — R269 Unspecified abnormalities of gait and mobility: Secondary | ICD-10-CM

## 2016-07-28 MED ORDER — NITROGLYCERIN 0.4 MG SL SUBL: SUBLINGUAL_TABLET | SUBLINGUAL | 1 refills | Status: DC

## 2016-07-28 NOTE — Progress Notes (Signed)
BP 140/72   Pulse 76   Temp 97.5 F (36.4 C) (Oral)   Wt 194 lb (88 kg)   BMI 29.50 kg/m    CC: f/u visit Subjective:    Patient ID: James Spurr, MD, male    DOB: 1931-07-08, 82 y.o.   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 Multiple falls without injury in known R hemiparesis after stroke. Regularly using side cane and quad cane. Completed [facility] rehab and balance training, has not returned. Sees Dr Leonie Man and Dr Danae Chen.   Would like referral to derm for AKs on forehead and L index finger skin swelling.   Preventative: COLONOSCOPY Date: 06/2011 mult polyps adenomatous, severe diverticulosis, rpt 3 yrs [name]). Pt decides to defer for now.  Prostate - checked by urology  Flu shot - today  Pneumovax - 09/2007. prevnar 2015  Td - 06/2016 Shingles shot - 09/2007  Advanced directives: Has advanced directives at home. Will bring copy for chart. Daughter James Bell is HCPOA. Seat belt use discussed Sunscreen use discussed. No changing moles on skin.   Some trouble with IADLs 2/2 stroke.   Caffeine: 1 cup coffee/day  Occupation: ophthalmologist, retired Edu: MD Activity: going to gym 3x/wk with trainer Diet: some water, fruits/vegetables some, red meat a few times, fish 1x/wk POA - James Bell (Daughter)  Relevant past medical, surgical, family and social history reviewed and updated as indicated. Interim medical history since our last visit reviewed. Allergies and medications reviewed and updated. Current Outpatient Prescriptions on File Prior to Visit  Medication Sig  . clopidogrel (PLAVIX) 75 MG tablet Take 1 tablet (75 mg total) by mouth daily.  . Coenzyme Q10 (COQ10) 150 MG CAPS Take 1 capsule by mouth 2 (two) times daily.  . diazepam (VALIUM) 5 MG tablet Take 1 tablet (5 mg total) by mouth every 8 (eight) hours as needed for  muscle spasms.  . DULoxetine (CYMBALTA) 60 MG capsule Take 1 capsule (60 mg total) by mouth daily.  . Folic Acid-Vit Q000111Q 123456 (FOLBEE) 2.5-25-1 MG TABS tablet Take 1 tablet by mouth daily.  Marland Kitchen ibuprofen (ADVIL,MOTRIN) 200 MG tablet Take 400 mg by mouth every 6 (six) hours as needed for mild pain or moderate pain.  . Insulin Degludec (TRESIBA FLEXTOUCH) 200 UNIT/ML SOPN Inject 80 Units into the skin at bedtime.   . Insulin Pen Needle (NOVOFINE) 30G X 8 MM MISC Use to inject insulin once daily dx:E11.40  . levothyroxine (LEVOTHROID) 25 MCG tablet Take 1 tablet (25 mcg total) by mouth daily before breakfast. Per endo [name]  . losartan (COZAAR) 25 MG tablet Take 1 tablet (25 mg total) by mouth daily.  . metoprolol tartrate (LOPRESSOR) 25 MG tablet Take 0.5 tablets (12.5 mg total) by mouth 2 (two) times daily.  Marland Kitchen omega-3 acid ethyl esters (LOVAZA) 1 g capsule Take 2 capsules (2 g total) by mouth 2 (two) times daily.  Marland Kitchen omeprazole (PRILOSEC) 40 MG capsule Take 1 capsule (40 mg total) by mouth daily.  . pramipexole (MIRAPEX) 1 MG tablet Take 2 tablets (2 mg total) by mouth daily. At 3pm  . rosuvastatin (CRESTOR) 40 MG tablet Take 1 tablet (40 mg total) by mouth daily.  . traZODone (DESYREL) 50 MG tablet Take 50 mg by mouth at bedtime as needed for sleep.  . vitamin C (ASCORBIC ACID) 500 MG  tablet Take 1,000 mg by mouth daily.   . Melatonin 5 MG CAPS Take 1 capsule by mouth at bedtime.  . sitaGLIPtin-metformin (JANUMET) 50-1000 MG tablet Take 1 tablet by mouth 2 (two) times daily with a meal.   No current facility-administered medications on file prior to visit.     Review of Systems Per HPI unless specifically indicated in ROS section     Objective:    BP 140/72   Pulse 76   Temp 97.5 F (36.4 C) (Oral)   Wt 194 lb (88 kg)   BMI 29.50 kg/m   Wt Readings from Last 3 Encounters:  07/28/16 194 lb (88 kg)  07/17/16 194 lb (88 kg)  06/12/16 188 lb 9.6 oz (85.5 kg)    Physical Exam    Constitutional: He appears well-developed and well-nourished. No distress.  Cardiovascular: Normal rate, regular rhythm and intact distal pulses.   Murmur (mild 2/6 systolic) heard. Pulmonary/Chest: Effort normal and breath sounds normal. No respiratory distress. He has no wheezes. He has no rales.  Musculoskeletal: He exhibits no edema.  Skin: Skin is warm and dry. No rash noted. No erythema.  Mild scaling bilateral temporal regions Left dorsal index finger with mild edema of skin between MCP and PIP without significant erythema or pain  Nursing note and vitals reviewed.  Results for orders placed or performed in visit on 07/17/16  Renal function panel  Result Value Ref Range   Sodium 138 135 - 145 mEq/L   Potassium 3.7 3.5 - 5.1 mEq/L   Chloride 103 96 - 112 mEq/L   CO2 27 19 - 32 mEq/L   Calcium 9.2 8.4 - 10.5 mg/dL   Albumin 3.8 3.5 - 5.2 g/dL   BUN 26 (H) 6 - 23 mg/dL   Creatinine, Ser 1.31 0.40 - 1.50 mg/dL   Glucose, Bld 106 (H) 70 - 99 mg/dL   Phosphorus 4.4 2.3 - 4.6 mg/dL   GFR 55.53 (L) >60.00 mL/min  CBC with Differential/Platelet  Result Value Ref Range   WBC 9.5 4.0 - 10.5 K/uL   RBC 4.43 4.22 - 5.81 Mil/uL   Hemoglobin 12.8 (L) 13.0 - 17.0 g/dL   HCT 37.5 (L) 39.0 - 52.0 %   MCV 84.6 78.0 - 100.0 fl   MCHC 34.3 30.0 - 36.0 g/dL   RDW 14.1 11.5 - 15.5 %   Platelets 198.0 150.0 - 400.0 K/uL   Neutrophils Relative % 67.3 43.0 - 77.0 %   Lymphocytes Relative 22.2 12.0 - 46.0 %   Monocytes Relative 8.2 3.0 - 12.0 %   Eosinophils Relative 1.9 0.0 - 5.0 %   Basophils Relative 0.4 0.0 - 3.0 %   Neutro Abs 6.4 1.4 - 7.7 K/uL   Lymphs Abs 2.1 0.7 - 4.0 K/uL   Monocytes Absolute 0.8 0.1 - 1.0 K/uL   Eosinophils Absolute 0.2 0.0 - 0.7 K/uL   Basophils Absolute 0.0 0.0 - 0.1 K/uL  Hemoglobin A1c  Result Value Ref Range   Hgb A1c MFr Bld 7.7 (H) 4.6 - 6.5 %      Assessment & Plan:   Problem List Items Addressed This Visit    CKD (chronic kidney disease) stage 3,  GFR 30-59 ml/min    Reviewed Cr with patient. Encouraged good hydration status as well as avoidance of NSAIDs and other nephrotoxins.      Gait disturbance, post-stroke    Encouraged continued use of ambulatory assistive devices.      Hemiparesis affecting right side  as late effect of stroke ([place])    Stable period with residual deficit      OSA on CPAP    Followed by Dr Rexene Alberts on CPAP      Skin lesion - Primary    2 issues:  1. AKs bilateral temples - refer to derm per pt request. Discussed importance of sunscreen use 2. L index finger skin thickening - unclear etiology but seems benign. Pt requests derm eval - referred.       Relevant Orders   Ambulatory referral to Dermatology   Type 2 diabetes, controlled, with neuropathy ([place])    Followed by endo.          Follow up plan: Return in about 6 months (around 01/25/2017) for follow up visit.  [name], MD

## 2016-07-28 NOTE — Patient Instructions (Addendum)
We will refer you to [facility]. Try to increase water intake by 1 glass a day. Return as needed or in 6 months for follow up visit.

## 2016-07-28 NOTE — Progress Notes (Signed)
Pre visit review using our clinic review tool, if applicable. No additional management support is needed unless otherwise documented below in the visit note. 

## 2016-07-29 NOTE — Assessment & Plan Note (Signed)
Followed by endo.  

## 2016-07-29 NOTE — Assessment & Plan Note (Signed)
Followed by Dr Rexene Alberts on CPAP

## 2016-07-29 NOTE — Assessment & Plan Note (Signed)
2 issues:  1. AKs bilateral temples - refer to derm per pt request. Discussed importance of sunscreen use 2. L index finger skin thickening - unclear etiology but seems benign. Pt requests derm eval - referred.

## 2016-07-29 NOTE — Assessment & Plan Note (Signed)
Stable period with residual deficit

## 2016-07-29 NOTE — Assessment & Plan Note (Signed)
Reviewed Cr with patient. Encouraged good hydration status as well as avoidance of NSAIDs and other nephrotoxins.

## 2016-07-29 NOTE — Assessment & Plan Note (Signed)
Encouraged continued use of ambulatory assistive devices.

## 2016-09-01 ENCOUNTER — Other Ambulatory Visit: Payer: Self-pay | Admitting: Internal Medicine

## 2016-09-01 ENCOUNTER — Other Ambulatory Visit: Payer: Self-pay | Admitting: Geriatric Medicine

## 2016-09-01 ENCOUNTER — Ambulatory Visit
Admission: RE | Admit: 2016-09-01 | Discharge: 2016-09-01 | Disposition: A | Discharge status: Home / Self Care | Payer: Medicare Other | Source: Ambulatory Visit | Attending: Geriatric Medicine | Admitting: Geriatric Medicine

## 2016-09-01 DIAGNOSIS — M545 Low back pain, unspecified: Secondary | ICD-10-CM

## 2016-09-01 DIAGNOSIS — Z794 Long term (current) use of insulin: Secondary | ICD-10-CM | POA: Diagnosis not present

## 2016-09-01 DIAGNOSIS — Z79899 Other long term (current) drug therapy: Secondary | ICD-10-CM | POA: Diagnosis not present

## 2016-09-01 DIAGNOSIS — E039 Hypothyroidism, unspecified: Secondary | ICD-10-CM | POA: Diagnosis not present

## 2016-09-01 DIAGNOSIS — R0989 Other specified symptoms and signs involving the circulatory and respiratory systems: Secondary | ICD-10-CM

## 2016-09-01 DIAGNOSIS — E785 Hyperlipidemia, unspecified: Secondary | ICD-10-CM | POA: Diagnosis not present

## 2016-09-01 DIAGNOSIS — E1142 Type 2 diabetes mellitus with diabetic polyneuropathy: Secondary | ICD-10-CM | POA: Diagnosis not present

## 2016-09-01 DIAGNOSIS — I129 Hypertensive chronic kidney disease with stage 1 through stage 4 chronic kidney disease, or unspecified chronic kidney disease: Secondary | ICD-10-CM | POA: Diagnosis not present

## 2016-09-01 DIAGNOSIS — Z8673 Personal history of transient ischemic attack (TIA), and cerebral infarction without residual deficits: Secondary | ICD-10-CM

## 2016-09-01 DIAGNOSIS — M5136 Other intervertebral disc degeneration, lumbar region: Secondary | ICD-10-CM | POA: Diagnosis not present

## 2016-09-01 DIAGNOSIS — N183 Chronic kidney disease, stage 3 (moderate): Secondary | ICD-10-CM | POA: Diagnosis not present

## 2016-09-06 ENCOUNTER — Ambulatory Visit
Admission: RE | Admit: 2016-09-06 | Discharge: 2016-09-06 | Disposition: A | Discharge status: Home / Self Care | Payer: Medicare Other | Source: Ambulatory Visit | Attending: Internal Medicine | Admitting: Internal Medicine

## 2016-09-06 DIAGNOSIS — E1142 Type 2 diabetes mellitus with diabetic polyneuropathy: Secondary | ICD-10-CM

## 2016-09-06 DIAGNOSIS — R0989 Other specified symptoms and signs involving the circulatory and respiratory systems: Secondary | ICD-10-CM

## 2016-09-06 DIAGNOSIS — R1909 Other intra-abdominal and pelvic swelling, mass and lump: Secondary | ICD-10-CM | POA: Diagnosis not present

## 2016-09-06 DIAGNOSIS — Z8673 Personal history of transient ischemic attack (TIA), and cerebral infarction without residual deficits: Secondary | ICD-10-CM

## 2016-09-18 DIAGNOSIS — C4491 Basal cell carcinoma of skin, unspecified: Secondary | ICD-10-CM

## 2016-09-18 HISTORY — DX: Basal cell carcinoma of skin, unspecified: C44.91

## 2016-09-20 ENCOUNTER — Ambulatory Visit: Payer: Medicare Other | Attending: Geriatric Medicine | Admitting: Physical Therapy

## 2016-09-20 DIAGNOSIS — R269 Unspecified abnormalities of gait and mobility: Secondary | ICD-10-CM | POA: Insufficient documentation

## 2016-09-20 DIAGNOSIS — M6281 Muscle weakness (generalized): Secondary | ICD-10-CM | POA: Diagnosis not present

## 2016-09-20 DIAGNOSIS — M545 Low back pain, unspecified: Secondary | ICD-10-CM

## 2016-09-20 DIAGNOSIS — G8929 Other chronic pain: Secondary | ICD-10-CM | POA: Insufficient documentation

## 2016-09-20 NOTE — Therapy (Signed)
[facility] [address] [name], [address] Phone: [phone]   Fax:  [phone]  Physical Therapy Evaluation  Patient Details  Name: James DOWTY, James Bell MRN: [id number] Date of Birth: 12/21/1930 Referring Provider: Lajean Manes James Bell  Encounter Date: 09/20/2016      PT End of Session - 09/20/16 1316    Visit Number 1   Number of Visits 9   Date for PT Re-Evaluation 11/08/16   PT Start Time 0800   PT Stop Time 0900   PT Time Calculation (min) 60 min   Activity Tolerance Patient tolerated treatment well   Behavior During Therapy [facility] for tasks assessed/performed      Past Medical History:  Diagnosis Date  . Anemia   . Basal cell carcinoma of nose 2002   bridge of nose  . BPH with obstruction/lower urinary tract symptoms    failed flomax, uroxatral, myrbetriq - sees urology [name]) pending videourodynamics  . CKD (chronic kidney disease) stage 3, GFR 30-59 ml/min 03/11/2012   Renal US 02/2014 - multiple kidney cysts   . CVA (cerebral infarction) 03/07/12   slurred speech; right hemplegia, left handed - completed PT 07/2014 (17 sessions)  . Depression    mild, on cymbalta per prior PCP  . Erectile dysfunction    per urology (prostaglandin injection)  . GERD (gastroesophageal reflux disease)    with sliding HH  . H/O hiatal hernia   . History of kidney problems 12/2010   lacerated left kidney after fall  . HLD (hyperlipidemia)   . Kidney cysts 02/2014   bilateral (renal US)  . Migraines 03/11/12   now rare  . OSA on CPAP    sleep study 01/2014 - severe, CPAP at 10, previously on nuvigil (Dr. [name])  . Palpitations 01/2012   holter WNL, rare PAC/PVCs  . Presbycusis of both ears 2016   rec amplification [facility])  . Restless leg syndrome    well controlled on mirapex  . TIA (transient ischemic attack)   . Type 2 diabetes, controlled, with neuropathy [facility]) 2011   [name] in [facility]     Past Surgical History:  Procedure Laterality Date  . ABI  2007   WNL  . CARDIAC CATHETERIZATION  2004   normal; [place] [facility]  . CARDIOVASCULAR STRESS TEST  02/2014   WNL, low risk scan, EF 68%  . CAROTID ENDARTERECTOMY    . CATARACT EXTRACTION W/ INTRAOCULAR LENS IMPLANT  11//2012 - 08/2011  . COLONOSCOPY  03/2011   poor prep, unable to biopsy polyp in tic fold, rpt 3 mo [name])  . COLONOSCOPY  06/2011   mult polyps adenomatous, severe diverticulosis, rpt 3 yrs [name])  . CYSTOSCOPY WITH URETHRAL DILATATION N/A 07/24/2013   Procedure: CYSTOSCOPY WITH URETHRAL DILATATION;  Surgeon: [name], James Bell;  Location: [facility];  Service: Urology;  Laterality: N/A;  . ESOPHAGOGASTRODUODENOSCOPY  2006   duodenitis, medual HH, Grade 2 reflux, mild gastritis  . ESOPHAGOGASTRODUODENOSCOPY  07/2011   mild chronic gastritis  . [place]  . [place]; 2002   left, then repaired  . MRI brain  04/2010   WNL  . PROSTATE SURGERY  02/2009   PVP (laser)  . SKIN CANCER EXCISION  2002   bridge of nose, BCC  . TONSILLECTOMY AND ADENOIDECTOMY  ~ 1945  . TRANSURETHRAL INCISION OF PROSTATE N/A 07/24/2013   Procedure: TRANSURETHRAL INCISION OF THE PROSTATE (TUIP);  Surgeon: [name], James Bell;  Location: [facility];  Service: Urology;  Laterality: N/A;  . URETHRAL DILATION  2014   tannenbaum    There were no vitals filed for this visit.       Subjective Assessment - 09/20/16 1325    Subjective Patient reports he has developed lower back pain over the last 3-4 months with no specific MOI. He has sleep apnea, sleeps with head elevated and believes this is involved. He has no pain while sleeping, but is painful and stiff when first waking up. He also reports he has pain with any prolonged standing. Denies any radiating pain from his lower back, no numbness and tingling associated with this onset of back pain (has peripheral diabetic neuropathy). He likes to scrapbook  in his spare time and was a Scientist, research (medical).    Pertinent History Patient has a history of L CVA impairing his R hand and LE function.    Limitations Standing;House hold activities;Walking   How long can you walk comfortably? Less than an hour.    Diagnostic tests X-rays    Patient Stated Goals To be able to wake up and stand for > 1 hour without pain    Currently in Pain? Yes   Pain Score 5    Pain Location Back   Pain Orientation Mid;Lower   Pain Descriptors / Indicators Aching   Pain Type Chronic pain   Pain Onset More than a month ago   Pain Frequency Intermittent   Aggravating Factors  Sleeping, standing for a long time.             [facility] PT Assessment - 09/20/16 1329      Assessment   Medical Diagnosis Acute low back pain   Referring Provider Lajean Manes James Bell     Precautions   Precautions None;Fall   Precaution Comments --  Wears a R AFO     Restrictions   Weight Bearing Restrictions No     Balance Screen   Has the patient fallen in the past 6 months No   Has the patient had a decrease in activity level because of a fear of falling?  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[employer], just wrote memoir     Cognition   Overall Cognitive Status Within Functional Limits for tasks assessed   Behaviors --  He does have mild aphasia (expressive)      Observation/Other Assessments   Modified [place] 26     Sensation   Light Touch Appears Intact     Strength   Overall Strength Comments --  R ankle IV/EV limited by AFO, hip/knee WNL     Palpation   SI assessment  --  WNL, no pain reported with PAs   Palpation comment --  Pain appears to be superior to PSIS bilaterally, off spine      FABER test   findings Negative     Slump test   Findings Negative     Straight Leg Raise   Findings Negative     Ely's Test   Findings Negative     Transfers   Comments --  Requires use of UEs to transfer  Supine Bridging - challenging for patient with pelvic control, not painful in lumbar spine and noted to have activation of posterior hip musculature. Completed x 10 (began to fatigue)   Sidelying clamshells - yellow t-band x 10 (appropriate challenge noted)   Sidelying hip abductions x 10 with bodyweight (appropriate activation and challenge noted)    Educated patient on sleeping with towel roll underneath lumbar spine which he reported provided adequate support and may alleviate his symptoms.   Educated patient on supine hip flexor stretch on LLE (appropriate stretch sensation, no increase in pain reported) .                      PT Education - 09/20/16 1315    Education provided Yes   Education Details Will start treatment with bed sleep modification, hip strengthening, and hip flexor stretching.    Person(s) Educated Patient   Methods Explanation;Demonstration;Handout   Comprehension Verbalized understanding;Returned demonstration             PT Long Term Goals - 09/20/16 1318      PT LONG TERM GOAL #1   Title Patient will be able to stand for 1 hour without increase in pain to demonstrate improved tolerance for ADLs.    Time 7   Period Weeks   Status New     PT LONG TERM GOAL #2   Title Patient will report modified ODI score of less than 15% to demonstrate improved tolerance for ADLs.    Baseline 26%   Time 7   Period Weeks   Status New     PT LONG TERM GOAL #3   Title Patient will complete 5x sit to stand in less than 14 seconds to demonstrate improved LE strength.    Time 7   Period Weeks   Status New     PT LONG TERM GOAL #4   Title Patient will report worst pain score of less than 3/10 on VAS to demonstrate improved  tolerance for ADLs.    Baseline 5/10   Time 7   Period Weeks   Status New               Plan - 09/20/16 1321    Clinical Impression Statement Patient is a pleasant retired James Bell with history of L CVA that presents with sub-acute to chronic LBP exacerbated by sleeping and prolonged standing. He was largely negative on all special testing, but did demonstrate mild hip flexor tightness and hip extensor weakness on examination today which could be contributing to his complaints. Patient was able to tolerate, but was challenged by low level strengthening program for posterior hip musculature. Patient will likely benefit from skilled PT services to address his discomfort with sleeping and prolonged standing.    Rehab Potential Good   Clinical Impairments Affecting Rehab Potential Patient has a history of CVA impairing R side, history of diabetic peripheral neuropathy.    PT Frequency 2x / week   PT Duration 4 weeks   PT Treatment/Interventions Biofeedback;Cryotherapy;Electrical Stimulation;Moist Heat;Ultrasound;Neuromuscular re-education;Balance training;Therapeutic exercise;Therapeutic activities;Manual techniques;Taping;Stair training;Gait training;DME Instruction;Patient/family education   PT Next Visit Plan Progress hip strengthening, balance and gait training. Check for directional preferences.    PT Home Exercise Plan Supine bridge, clamshells, sidelying hip abductions, supine hip flexor stretch    Consulted and Agree with Plan of Care Patient      Patient will benefit from skilled therapeutic intervention in order to improve the following deficits and impairments:  Abnormal gait,  Decreased endurance, Decreased strength, Decreased balance, Decreased mobility, Difficulty walking  Visit Diagnosis: Chronic midline low back pain without sciatica - Plan: PT plan of care cert/re-cert  Muscle weakness - Plan: PT plan of care cert/re-cert      G-Codes - [id number]    Functional  Assessment Tool Used Patient report, modified ODI   Functional Limitation Mobility: Walking and moving around   Mobility: Walking and Moving Around Current Status [phone]) At least 20 percent but less than 40 percent impaired, limited or restricted   Mobility: Walking and Moving Around Goal Status [phone]) At least 1 percent but less than 20 percent impaired, limited or restricted       Problem List Patient Active Problem List   Diagnosis Date Noted  . Dry cough 10/25/2015  . Gait disturbance, post-stroke 10/05/2015  . Presbycusis of both ears   . Advanced care planning/counseling discussion 06/23/2015  . Skin lesion 06/23/2015  . Impaired mobility 04/23/2015  . HTN (hypertension) 03/18/2015  . Chronic diastolic heart failure, NYHA class 1 ([place]) 03/18/2015  . Spastic hemiparesis affecting dominant side ([place]) 07/07/2014  . Left shoulder pain 03/16/2014  . Chest pressure 02/03/2014  . Medicare annual wellness visit, subsequent 11/06/2013  . Erectile dysfunction due to arterial insufficiency 02/05/2013  . Depression   . Palpitations 06/26/2012  . HLD (hyperlipidemia)   . Restless leg syndrome   . GERD (gastroesophageal reflux disease)   . BPH with obstruction/lower urinary tract symptoms   . History of CVA (cerebrovascular accident) 03/11/2012  . Hemiparesis affecting right side as late effect of stroke ([place]) 03/11/2012  . Type 2 diabetes, controlled, with neuropathy ([place]) 03/11/2012  . OSA on CPAP 03/11/2012  . CKD (chronic kidney disease) stage 3, GFR 30-59 ml/min 03/11/2012  . Migraines 03/11/2012   Kerman Passey, PT, DPT    09/20/2016, 1:41 PM  [facility] [address], [name], [address] Phone: [phone]   Fax:  [phone]  Name: James CHABAN, James Bell MRN: [id number] Date of Birth: 1931-02-20

## 2016-09-25 ENCOUNTER — Ambulatory Visit: Payer: Medicare Other | Admitting: Physical Therapy

## 2016-09-25 DIAGNOSIS — R269 Unspecified abnormalities of gait and mobility: Secondary | ICD-10-CM | POA: Diagnosis not present

## 2016-09-25 DIAGNOSIS — M6281 Muscle weakness (generalized): Secondary | ICD-10-CM | POA: Diagnosis not present

## 2016-09-25 DIAGNOSIS — G8929 Other chronic pain: Secondary | ICD-10-CM | POA: Diagnosis not present

## 2016-09-25 DIAGNOSIS — M545 Low back pain, unspecified: Secondary | ICD-10-CM

## 2016-09-25 NOTE — Therapy (Signed)
[facility] [address], [name], [address] Phone: [phone]   Fax:  [phone]  Physical Therapy Treatment  Patient Details  Name: James KELLUM, James Bell MRN: [id number] Date of Birth: 03-11-1931 Referring Provider: Lajean Manes James Bell  Encounter Date: 09/25/2016      PT End of Session - 09/25/16 0841    Visit Number 2   Number of Visits 9   Date for PT Re-Evaluation 11/08/16   PT Start Time 0751   PT Stop Time 0835   PT Time Calculation (min) 44 min   Activity Tolerance Patient tolerated treatment well   Behavior During Therapy [facility] for tasks assessed/performed      Past Medical History:  Diagnosis Date  . Anemia   . Basal cell carcinoma of nose 2002   bridge of nose  . BPH with obstruction/lower urinary tract symptoms    failed flomax, uroxatral, myrbetriq - sees urology [name]) pending videourodynamics  . CKD (chronic kidney disease) stage 3, GFR 30-59 ml/min 03/11/2012   Renal US 02/2014 - multiple kidney cysts   . CVA (cerebral infarction) 03/07/12   slurred speech; right hemplegia, left handed - completed PT 07/2014 (17 sessions)  . Depression    mild, on cymbalta per prior PCP  . Erectile dysfunction    per urology (prostaglandin injection)  . GERD (gastroesophageal reflux disease)    with sliding HH  . H/O hiatal hernia   . History of kidney problems 12/2010   lacerated left kidney after fall  . HLD (hyperlipidemia)   . Kidney cysts 02/2014   bilateral (renal US)  . Migraines 03/11/12   now rare  . OSA on CPAP    sleep study 01/2014 - severe, CPAP at 10, previously on nuvigil (Dr. [name])  . Palpitations 01/2012   holter WNL, rare PAC/PVCs  . Presbycusis of both ears 2016   rec amplification [facility])  . Restless leg syndrome    well controlled on mirapex  . TIA (transient ischemic attack)   . Type 2 diabetes, controlled, with neuropathy [facility]) 2011   [name] in [facility]     Past Surgical History:  Procedure Laterality Date  . ABI  2007   WNL  . CARDIAC CATHETERIZATION  2004   normal; [facility]  . CARDIOVASCULAR STRESS TEST  02/2014   WNL, low risk scan, EF 68%  . CAROTID ENDARTERECTOMY    . CATARACT EXTRACTION W/ INTRAOCULAR LENS IMPLANT  11//2012 - 08/2011  . COLONOSCOPY  03/2011   poor prep, unable to biopsy polyp in tic fold, rpt 3 mo [name])  . COLONOSCOPY  06/2011   mult polyps adenomatous, severe diverticulosis, rpt 3 yrs [name])  . CYSTOSCOPY WITH URETHRAL DILATATION N/A 07/24/2013   Procedure: CYSTOSCOPY WITH URETHRAL DILATATION;  Surgeon: [name], James Bell;  [facility]: [facility];  Service: Urology;  Laterality: N/A;  . ESOPHAGOGASTRODUODENOSCOPY  2006   duodenitis, medual HH, Grade 2 reflux, mild gastritis  . ESOPHAGOGASTRODUODENOSCOPY  07/2011   mild chronic gastritis  . [place]  . [place]; 2002   left, then repaired  . MRI brain  04/2010   WNL  . PROSTATE SURGERY  02/2009   PVP (laser)  . SKIN CANCER EXCISION  2002   bridge of nose, BCC  . TONSILLECTOMY AND ADENOIDECTOMY  ~ 1945  . TRANSURETHRAL INCISION OF PROSTATE N/A 07/24/2013   Procedure: TRANSURETHRAL INCISION OF THE PROSTATE (TUIP);  Surgeon: [name], James Bell;  [facility];  Service: Urology;  Laterality: N/A;  . URETHRAL DILATION  2014   tannenbaum    There were no vitals filed for this visit.      Subjective Assessment - 09/25/16 0756    Subjective (P)  Patient reports he has tried a towel roll during sleep, but has not been able to keep it in place. He reports he will be getting a pillow as well. He has been having more discomfort with his L shoulder (dominant). Reports he has been compliant with HEP.    Pertinent History (P)  Patient has a history of L CVA impairing his R hand and LE function.    Limitations (P)  Standing;House hold activities;Walking   How long can you walk comfortably? (P)  Less than an hour.     Diagnostic tests (P)  X-rays    Patient Stated Goals (P)  To be able to wake up and stand for > 1 hour without pain    Currently in Pain? (P)  Other (Comment)  "more of an awareness of it". More pain in L shoulder this AM      Supine bridging (double leg concentric, single leg eccentric bilaterally (more challenging on RLE for pelvic control), was having some discomfort/activity in lumbar spine noticed he was hyper-extending, once cued to bring hips to neutral, no longer having discomfort. Performed x 8 per side.   Standing hip abductions with HHA on hemi-walker x 8 per side -- cuing for maintaining neutral spine.  Gait analysis -- observed to have L side flexion in R stance phase, trunk rotation and anterior flexion in R stance to maximize weightbearing through LUE. (Trendelenburg).Discussed shifting his hemi-walker in lateral position and also increasing height, will continue to observe and dedicate next therapy session to adjusting walker and observing results.                            PT Education - 09/25/16 [phone]    Education provided Yes   Education Details Will begin to progress strengthening and modify gait pattern in next session.    Person(s) Educated Patient   Methods Explanation;Demonstration;Handout   Comprehension Returned demonstration;Verbalized understanding             PT Long Term Goals - 09/20/16 1318      PT LONG TERM GOAL #1   Title Patient will be able to stand for 1 hour without increase in pain to demonstrate improved tolerance for ADLs.    Time 7   Period Weeks   Status New     PT LONG TERM GOAL #2   Title Patient will report modified ODI score of less than 15% to demonstrate improved tolerance for ADLs.    Baseline 26%   Time 7   Period Weeks   Status New     PT LONG TERM GOAL #3   Title Patient will complete 5x sit to stand in less than 14 seconds to demonstrate improved LE strength.    Time 7   Period Weeks   Status  New     PT LONG TERM GOAL #4   Title Patient will report worst pain score of less than 3/10 on VAS to demonstrate improved tolerance for ADLs.    Baseline 5/10   Time 7   Period Weeks   Status New  Plan - 09/25/16 0841    Clinical Impression Statement Patient reports the towel roll he has been using may have shifted too much throughout the evenings, educated him to use a pillow instead for lumbar support. He demonstrates lateral torso deviation in standing secondary to R hip abductor weakness as observed in video of gait pattern. Will continue to progress strengthening of R hip abductor and gait alterations as indicated.    Rehab Potential Good   Clinical Impairments Affecting Rehab Potential Patient has a history of CVA impairing R side, history of diabetic peripheral neuropathy.    PT Frequency 2x / week   PT Duration 4 weeks   PT Treatment/Interventions Biofeedback;Cryotherapy;Electrical Stimulation;Moist Heat;Ultrasound;Neuromuscular re-education;Balance training;Therapeutic exercise;Therapeutic activities;Manual techniques;Taping;Stair training;Gait training;DME Instruction;Patient/family education   PT Next Visit Plan Progress hip strengthening, balance and gait training. Check for directional preferences.    PT Home Exercise Plan Supine bridge, clamshells, sidelying hip abductions, supine hip flexor stretch    Consulted and Agree with Plan of Care Patient      Patient will benefit from skilled therapeutic intervention in order to improve the following deficits and impairments:  Abnormal gait, Decreased endurance, Decreased strength, Decreased balance, Decreased mobility, Difficulty walking  Visit Diagnosis: Chronic midline low back pain without sciatica  Muscle weakness  Abnormality of gait     Problem List Patient Active Problem List   Diagnosis Date Noted  . Dry cough 10/25/2015  . Gait disturbance, post-stroke 10/05/2015  . Presbycusis of both  ears   . Advanced care planning/counseling discussion 06/23/2015  . Skin lesion 06/23/2015  . Impaired mobility 04/23/2015  . HTN (hypertension) 03/18/2015  . Chronic diastolic heart failure, NYHA class 1 ([place]) 03/18/2015  . Spastic hemiparesis affecting dominant side ([place]) 07/07/2014  . Left shoulder pain 03/16/2014  . Chest pressure 02/03/2014  . Medicare annual wellness visit, subsequent 11/06/2013  . Erectile dysfunction due to arterial insufficiency 02/05/2013  . Depression   . Palpitations 06/26/2012  . HLD (hyperlipidemia)   . Restless leg syndrome   . GERD (gastroesophageal reflux disease)   . BPH with obstruction/lower urinary tract symptoms   . History of CVA (cerebrovascular accident) 03/11/2012  . Hemiparesis affecting right side as late effect of stroke ([place]) 03/11/2012  . Type 2 diabetes, controlled, with neuropathy ([place]) 03/11/2012  . OSA on CPAP 03/11/2012  . CKD (chronic kidney disease) stage 3, GFR 30-59 ml/min 03/11/2012  . Migraines 03/11/2012    Kerman Passey, PT, DPT    09/25/2016, 8:47 AM  [facility] [address] [name], [address] Phone: [phone]   Fax:  [phone]  Name: James MOAK, James Bell MRN: [id number] Date of Birth: 24-Sep-1930

## 2016-09-27 ENCOUNTER — Ambulatory Visit: Payer: Medicare Other | Admitting: Physical Therapy

## 2016-09-27 ENCOUNTER — Telehealth: Payer: Self-pay | Admitting: *Deleted

## 2016-09-27 NOTE — Telephone Encounter (Signed)
Form for diabetic testing supplies in your IN box 

## 2016-09-28 ENCOUNTER — Encounter: Payer: Self-pay | Admitting: Physical Medicine & Rehabilitation

## 2016-09-28 ENCOUNTER — Encounter: Payer: Medicare Other | Attending: Physical Medicine & Rehabilitation

## 2016-09-28 ENCOUNTER — Ambulatory Visit (HOSPITAL_BASED_OUTPATIENT_CLINIC_OR_DEPARTMENT_OTHER): Payer: Medicare Other | Admitting: Physical Medicine & Rehabilitation

## 2016-09-28 VITALS — BP 117/66 | HR 64 | Resp 14

## 2016-09-28 DIAGNOSIS — G8110 Spastic hemiplegia affecting unspecified side: Secondary | ICD-10-CM

## 2016-09-28 DIAGNOSIS — R252 Cramp and spasm: Secondary | ICD-10-CM | POA: Diagnosis not present

## 2016-09-28 NOTE — Telephone Encounter (Signed)
Form faxed

## 2016-09-28 NOTE — Telephone Encounter (Signed)
Filled and in Kim's box. 

## 2016-09-28 NOTE — Patient Instructions (Addendum)
You received a Botox injection today. You may experience soreness at the needle injection sites. Please call us if any of the injection sites turns red after a couple days or if there is any drainage. You may experience muscle weakness as a result of Botox. This would improve with time but can take several weeks to improve. The Botox should start working in about one week. The Botox usually last 3 months. The injection can be repeated every 3 months as needed.   Please call earlier if your low back is not feeling any better, I can check you for that as well.

## 2016-09-28 NOTE — Progress Notes (Signed)
   Botox Injection for spasticity using needle EMG guidance  Dilution: 50 Units/ml Indication: Severe spasticity which interferes with ADL,mobility and/or  hygiene and is unresponsive to medication management and other conservative care Informed consent was obtained after describing risks and benefits of the procedure with the patient. This includes bleeding, bruising, infection, excessive weakness, or medication side effects. A REMS form is on file and signed. Needle: 27g 1" needle electrode Number of units per muscle Biceps100 FCR25 FCU0 FDS25  Lumbrical 12 and 3, 12.5 Units each=37.5 FDP with estim to Identify DIP flexion digits 2 and 3, 12.5 units injected All injections were done after obtaining appropriate EMG activity and after negative drawback for blood. The patient tolerated the procedure well. Post procedure instructions were given. A followup appointment was made.

## 2016-10-03 ENCOUNTER — Telehealth: Payer: Self-pay

## 2016-10-03 NOTE — Telephone Encounter (Signed)
I spoke to patient and r/s due to possible snow.

## 2016-10-04 ENCOUNTER — Ambulatory Visit: Payer: Medicare Other | Admitting: Neurology

## 2016-10-04 ENCOUNTER — Ambulatory Visit: Payer: Medicare Other | Admitting: Physical Therapy

## 2016-10-09 ENCOUNTER — Ambulatory Visit: Payer: Medicare Other | Admitting: Physical Therapy

## 2016-10-09 DIAGNOSIS — M6281 Muscle weakness (generalized): Secondary | ICD-10-CM | POA: Diagnosis not present

## 2016-10-09 DIAGNOSIS — M545 Low back pain, unspecified: Secondary | ICD-10-CM

## 2016-10-09 DIAGNOSIS — R269 Unspecified abnormalities of gait and mobility: Secondary | ICD-10-CM | POA: Diagnosis not present

## 2016-10-09 DIAGNOSIS — G8929 Other chronic pain: Secondary | ICD-10-CM

## 2016-10-09 NOTE — Therapy (Signed)
[facility] [address], [name], [address] Phone: [phone]   Fax:  [phone]  Physical Therapy Treatment  Patient Details  Name: James DEJA, MD MRN: [id number] Date of Birth: 1931/02/11 Referring Provider: Lajean Manes MD  Encounter Date: 10/09/2016      PT End of Session - 10/09/16 0818    Visit Number 3   Number of Visits 9   Date for PT Re-Evaluation 11/08/16   PT Start Time 0804   PT Stop Time 0849   PT Time Calculation (min) 45 min   Activity Tolerance Patient tolerated treatment well   Behavior During Therapy [facility] for tasks assessed/performed      Past Medical History:  Diagnosis Date  . Anemia   . Basal cell carcinoma of nose 2002   bridge of nose  . BPH with obstruction/lower urinary tract symptoms    failed flomax, uroxatral, myrbetriq - sees urology [name]) pending videourodynamics  . CKD (chronic kidney disease) stage 3, GFR 30-59 ml/min 03/11/2012   Renal US 02/2014 - multiple kidney cysts   . CVA (cerebral infarction) 03/07/12   slurred speech; right hemplegia, left handed - completed PT 07/2014 (17 sessions)  . Depression    mild, on cymbalta per prior PCP  . Erectile dysfunction    per urology (prostaglandin injection)  . GERD (gastroesophageal reflux disease)    with sliding HH  . H/O hiatal hernia   . History of kidney problems 12/2010   lacerated left kidney after fall  . HLD (hyperlipidemia)   . Kidney cysts 02/2014   bilateral (renal US)  . Migraines 03/11/12   now rare  . OSA on CPAP    sleep study 01/2014 - severe, CPAP at 10, previously on nuvigil (Dr. [name])  . Palpitations 01/2012   holter WNL, rare PAC/PVCs  . Presbycusis of both ears 2016   rec amplification [facility])  . Restless leg syndrome    well controlled on mirapex  . TIA (transient ischemic attack)   . Type 2 diabetes, controlled, with neuropathy [facility]) 2011   [name] in [facility]     Past Surgical History:  Procedure Laterality Date  . ABI  2007   WNL  . CARDIAC CATHETERIZATION  2004   normal; [place]  . CARDIOVASCULAR STRESS TEST  02/2014   WNL, low risk scan, EF 68%  . CAROTID ENDARTERECTOMY    . CATARACT EXTRACTION W/ INTRAOCULAR LENS IMPLANT  11//2012 - 08/2011  . COLONOSCOPY  03/2011   poor prep, unable to biopsy polyp in tic fold, rpt 3 mo [name])  . COLONOSCOPY  06/2011   mult polyps adenomatous, severe diverticulosis, rpt 3 yrs [name])  . CYSTOSCOPY WITH URETHRAL DILATATION N/A 07/24/2013   Procedure: CYSTOSCOPY WITH URETHRAL DILATATION;  Surgeon: [name], MD;  Location: [facility];  Service: Urology;  Laterality: N/A;  . ESOPHAGOGASTRODUODENOSCOPY  2006   duodenitis, medual HH, Grade 2 reflux, mild gastritis  . ESOPHAGOGASTRODUODENOSCOPY  07/2011   mild chronic gastritis  . [place]  . [place]; 2002   left, then repaired  . MRI brain  04/2010   WNL  . PROSTATE SURGERY  02/2009   PVP (laser)  . SKIN CANCER EXCISION  2002   bridge of nose, BCC  . TONSILLECTOMY AND ADENOIDECTOMY  ~ 1945  . TRANSURETHRAL INCISION OF PROSTATE N/A 07/24/2013   Procedure: TRANSURETHRAL INCISION OF THE PROSTATE (TUIP);  Surgeon: [name], MD;  Location: [facility];  Service: Urology;  Laterality: N/A;  . URETHRAL DILATION  2014   tannenbaum    There were no vitals filed for this visit.      Subjective Assessment - 10/09/16 0805    Subjective Patient reports he was sick last week, received a botox injection for his L biceps. Reports his midsection was sore after last PT session (fatigued from session), reports he has had trouble staying asleep, but more b/c he is restless. He reports he continues to have trouble straightening up first thing in the morning.    Pertinent History Patient has a history of L CVA impairing his R hand and LE function.    Limitations Standing;House hold activities;Walking   How long can  you walk comfortably? Less than an hour.    Diagnostic tests X-rays    Patient Stated Goals To be able to wake up and stand for > 1 hour without pain    Currently in Pain? No/denies      Supine hip flexor stretch (cued to have his leg dangle a bit further over the edge of the bed to allow for increased stretch of hip flexor)  Supine bridging x 10 for 3 sets (improved core control noted by therapist)  Matrix Hip at 25#, 10# (x8 per side patient reported) -- (felt some pain in his paraspinals due to compensation)   Standing hip abductions with red t-band (notable for TFL compensation until cuing otherwise) x8 for 2 sets bilaterally (quite challenging for him).   Sidestepping with red band at TM x 4 laps total (notable for appropriate fatigue)    Gait analysis - decreased lateral flexion with quad cane (which was ~1" higher than his hemi-walker) he also brought in a rolling handhald device which was appropriate height.                           PT Education - 10/09/16 0817    Education provided Yes   Education Details Need to focus on posterior hip strengthening to decrease lateral trunk flexion.    Person(s) Educated Patient   Methods Explanation;Demonstration   Comprehension Verbalized understanding;Returned demonstration             PT Long Term Goals - 09/20/16 1318      PT LONG TERM GOAL #1   Title Patient will be able to stand for 1 hour without increase in pain to demonstrate improved tolerance for ADLs.    Time 7   Period Weeks   Status New     PT LONG TERM GOAL #2   Title Patient will report modified ODI score of less than 15% to demonstrate improved tolerance for ADLs.    Baseline 26%   Time 7   Period Weeks   Status New     PT LONG TERM GOAL #3   Title Patient will complete 5x sit to stand in less than 14 seconds to demonstrate improved LE strength.    Time 7   Period Weeks   Status New     PT LONG TERM GOAL #4   Title Patient will  report worst pain score of less than 3/10 on VAS to demonstrate improved tolerance for ADLs.    Baseline 5/10   Time 7   Period Weeks   Status New               Plan - 10/09/16 [id number]  Clinical Impression Statement Patient continues to demonstrate gluteues medius weakness in stance, though improved with higher/elevated hand surface (cane). Patient reports pain in L shoulder, which is likely from excessive weight bearing.  He demonstrated improved gait mechanics with elevated cane in his hand (decreased lateral trunk flexion).    Rehab Potential Good   Clinical Impairments Affecting Rehab Potential Patient has a history of CVA impairing R side, history of diabetic peripheral neuropathy.    PT Frequency 2x / week   PT Duration 4 weeks   PT Treatment/Interventions Biofeedback;Cryotherapy;Electrical Stimulation;Moist Heat;Ultrasound;Neuromuscular re-education;Balance training;Therapeutic exercise;Therapeutic activities;Manual techniques;Taping;Stair training;Gait training;DME Instruction;Patient/family education   PT Next Visit Plan Progress hip strengthening, balance and gait training. Check for directional preferences.    PT Home Exercise Plan Supine bridge, clamshells, sidelying hip abductions, supine hip flexor stretch    Consulted and Agree with Plan of Care Patient      Patient will benefit from skilled therapeutic intervention in order to improve the following deficits and impairments:  Abnormal gait, Decreased endurance, Decreased strength, Decreased balance, Decreased mobility, Difficulty walking  Visit Diagnosis: Chronic midline low back pain without sciatica  Muscle weakness  Abnormality of gait     Problem List Patient Active Problem List   Diagnosis Date Noted  . Dry cough 10/25/2015  . Gait disturbance, post-stroke 10/05/2015  . Presbycusis of both ears   . Advanced care planning/counseling discussion 06/23/2015  . Skin lesion 06/23/2015  . Impaired mobility  04/23/2015  . HTN (hypertension) 03/18/2015  . Chronic diastolic heart failure, NYHA class 1 ([place]) 03/18/2015  . Spastic hemiparesis affecting dominant side ([place]) 07/07/2014  . Left shoulder pain 03/16/2014  . Chest pressure 02/03/2014  . Medicare annual wellness visit, subsequent 11/06/2013  . Erectile dysfunction due to arterial insufficiency 02/05/2013  . Depression   . Palpitations 06/26/2012  . HLD (hyperlipidemia)   . Restless leg syndrome   . GERD (gastroesophageal reflux disease)   . BPH with obstruction/lower urinary tract symptoms   . History of CVA (cerebrovascular accident) 03/11/2012  . Hemiparesis affecting right side as late effect of stroke ([place]) 03/11/2012  . Type 2 diabetes, controlled, with neuropathy ([place]) 03/11/2012  . OSA on CPAP 03/11/2012  . CKD (chronic kidney disease) stage 3, GFR 30-59 ml/min 03/11/2012  . Migraines 03/11/2012   Kerman Passey, PT, DPT    10/09/2016, 10:28 AM  [facility] [address], [name], [address] Phone: [phone]   Fax:  [phone]  Name: KAENAN HIMMELBERGER, MD MRN: [id number] Date of Birth: Jul 14, 1931

## 2016-10-09 NOTE — Patient Instructions (Addendum)
Supine hip flexor stretch (cued to have his leg dangle a bit further over the edge of the bed to allow for increased stretch of hip flexor)  Supine bridging x 10 for 3 sets (improved core control noted by therapist)  Matrix Hip at 25#, 10# (x8 per side patient reported) --   Standing hip abductions with red t-band (notable for TFL compensation until cuing otherwise) x8 for 2 sets bilaterally (quite challenging for him).   Sidestepping with red band at TM x 4 laps total (notable for appropriate fatigue)

## 2016-10-12 ENCOUNTER — Ambulatory Visit: Payer: Medicare Other | Admitting: Physical Therapy

## 2016-10-12 DIAGNOSIS — G8929 Other chronic pain: Secondary | ICD-10-CM | POA: Diagnosis not present

## 2016-10-12 DIAGNOSIS — R269 Unspecified abnormalities of gait and mobility: Secondary | ICD-10-CM | POA: Diagnosis not present

## 2016-10-12 DIAGNOSIS — M545 Low back pain, unspecified: Secondary | ICD-10-CM

## 2016-10-12 DIAGNOSIS — M6281 Muscle weakness (generalized): Secondary | ICD-10-CM | POA: Diagnosis not present

## 2016-10-16 ENCOUNTER — Ambulatory Visit: Payer: Medicare Other | Admitting: Physical Therapy

## 2016-10-16 ENCOUNTER — Encounter: Payer: Self-pay | Admitting: Physical Therapy

## 2016-10-16 DIAGNOSIS — M545 Low back pain, unspecified: Secondary | ICD-10-CM

## 2016-10-16 DIAGNOSIS — G8929 Other chronic pain: Secondary | ICD-10-CM | POA: Diagnosis not present

## 2016-10-16 DIAGNOSIS — R269 Unspecified abnormalities of gait and mobility: Secondary | ICD-10-CM

## 2016-10-16 DIAGNOSIS — M6281 Muscle weakness (generalized): Secondary | ICD-10-CM

## 2016-10-16 NOTE — Therapy (Signed)
[facility] [address] [name], [address] Phone: [phone]   Fax:  [phone]  Physical Therapy Treatment  Patient Details  Name: James NEWFIELD, MD MRN: [id number] Date of Birth: 1930/10/07 Referring Provider: Lajean Manes MD  Encounter Date: 10/16/2016      PT End of Session - 10/16/16 1102    Visit Number 4   Number of Visits 9   Date for PT Re-Evaluation 11/08/16   PT Start Time 1058   PT Stop Time 1146   PT Time Calculation (min) 48 min   Activity Tolerance Patient tolerated treatment well   Behavior During Therapy [facility] for tasks assessed/performed      Past Medical History:  Diagnosis Date  . Anemia   . Basal cell carcinoma of nose 2002   bridge of nose  . BPH with obstruction/lower urinary tract symptoms    failed flomax, uroxatral, myrbetriq - sees urology [name]) pending videourodynamics  . CKD (chronic kidney disease) stage 3, GFR 30-59 ml/min 03/11/2012   Renal US 02/2014 - multiple kidney cysts   . CVA (cerebral infarction) 03/07/12   slurred speech; right hemplegia, left handed - completed PT 07/2014 (17 sessions)  . Depression    mild, on cymbalta per prior PCP  . Erectile dysfunction    per urology (prostaglandin injection)  . GERD (gastroesophageal reflux disease)    with sliding HH  . H/O hiatal hernia   . History of kidney problems 12/2010   lacerated left kidney after fall  . HLD (hyperlipidemia)   . Kidney cysts 02/2014   bilateral (renal US)  . Migraines 03/11/12   now rare  . OSA on CPAP    sleep study 01/2014 - severe, CPAP at 10, previously on nuvigil (Dr. [name])  . Palpitations 01/2012   holter WNL, rare PAC/PVCs  . Presbycusis of both ears 2016   rec amplification [facility])  . Restless leg syndrome    well controlled on mirapex  . TIA (transient ischemic attack)   . Type 2 diabetes, controlled, with neuropathy [facility]) 2011   [name] in [facility]     Past Surgical History:  Procedure Laterality Date  . ABI  2007   WNL  . CARDIAC CATHETERIZATION  2004   normal; [place]  . CARDIOVASCULAR STRESS TEST  02/2014   WNL, low risk scan, EF 68%  . CAROTID ENDARTERECTOMY    . CATARACT EXTRACTION W/ INTRAOCULAR LENS IMPLANT  11//2012 - 08/2011  . COLONOSCOPY  03/2011   poor prep, unable to biopsy polyp in tic fold, rpt 3 mo [name])  . COLONOSCOPY  06/2011   mult polyps adenomatous, severe diverticulosis, rpt 3 yrs [name])  . CYSTOSCOPY WITH URETHRAL DILATATION N/A 07/24/2013   Procedure: CYSTOSCOPY WITH URETHRAL DILATATION;  Surgeon: [name], MD;  [facility];  Service: Urology;  Laterality: N/A;  . ESOPHAGOGASTRODUODENOSCOPY  2006   duodenitis, medual HH, Grade 2 reflux, mild gastritis  . ESOPHAGOGASTRODUODENOSCOPY  07/2011   mild chronic gastritis  . [place]  . [place]; 2002   left, then repaired  . MRI brain  04/2010   WNL  . PROSTATE SURGERY  02/2009   PVP (laser)  . SKIN CANCER EXCISION  2002   bridge of nose, BCC  . TONSILLECTOMY AND ADENOIDECTOMY  ~ 1945  . TRANSURETHRAL INCISION OF PROSTATE N/A 07/24/2013   Procedure: TRANSURETHRAL INCISION OF THE PROSTATE (TUIP);  Surgeon: [name], MD;  Location[facility];  Service: Urology;  Laterality: N/A;  . URETHRAL DILATION  2014   tannenbaum    There were no vitals filed for this visit.      Subjective Assessment - 10/16/16 1100    Subjective Pt denies pain and denies any changes since last session.  Reports he was sore after last session but it was a "good sore".  Has been completing his HEP as prescribed.   Pertinent History Patient has a history of L CVA impairing his R hand and LE function.    Limitations Standing;House hold activities;Walking   How long can you walk comfortably? Less than an hour.    Diagnostic tests X-rays    Patient Stated Goals To be able to wake up and stand for > 1 hour without pain     Currently in Pain? No/denies        TREATMENT   Therapeutic Exercise:  BLE supine hip flexor stretch (cued to have his leg dangle a bit further over the edge of the bed to allow for increased stretch of hip flexor). 3x45seconds  Supine bridging 3x10 with cues for glute and core activation  Standing L hip abductions with red t-band and cues for glute activation as well as cues to bring LE back more at a diagonal rather than directly to the side to avoid TFL activation. 2x10  Standing L hip extension with red t-band 2x10  OMEGA Bil leg press 45# 2x10 reps with cues for eccentric control (pt reports medium to difficult challenge with this)  OMEGA RLE leg press 15# x10, visibly notable fatigue toward end of set  SLS on RLE 1x30 seconds on airex. Again with demonstration and verbal and tactile cues for hip hike on L to level pelvis so to activate glutes on R. Pt fatigues quickly with this 3x20 seconds.  Sidestepping with red band at TM x 3 laps total             PT Education - 10/16/16 1101    Education provided Yes   Education Details Exercise technique   Person(s) Educated Patient   Methods Explanation   Comprehension Verbalized understanding;Returned demonstration;Need further instruction;Verbal cues required             PT Long Term Goals - 09/20/16 1318      PT LONG TERM GOAL #1   Title Patient will be able to stand for 1 hour without increase in pain to demonstrate improved tolerance for ADLs.    Time 7   Period Weeks   Status New     PT LONG TERM GOAL #2   Title Patient will report modified ODI score of less than 15% to demonstrate improved tolerance for ADLs.    Baseline 26%   Time 7   Period Weeks   Status New     PT LONG TERM GOAL #3   Title Patient will complete 5x sit to stand in less than 14 seconds to demonstrate improved LE strength.    Time 7   Period Weeks   Status New     PT LONG TERM GOAL #4   Title Patient will report worst pain score of  less than 3/10 on VAS to demonstrate improved tolerance for ADLs.    Baseline 5/10   Time 7   Period Weeks   Status New               Plan - 10/16/16 1146  Clinical Impression Statement Pt fatigues quickly with glute med and glute max exercises but tolerated all interventions well.  Verbal and tactile cues provided for proper glute activation.  Pt will benefit from continued skilled PT interventions for improve RLE strength and function.    Rehab Potential Good   Clinical Impairments Affecting Rehab Potential Patient has a history of CVA impairing R side, history of diabetic peripheral neuropathy.    PT Frequency 2x / week   PT Duration 4 weeks   PT Treatment/Interventions Biofeedback;Cryotherapy;Electrical Stimulation;Moist Heat;Ultrasound;Neuromuscular re-education;Balance training;Therapeutic exercise;Therapeutic activities;Manual techniques;Taping;Stair training;Gait training;DME Instruction;Patient/family education   PT Next Visit Plan Progress hip strengthening, balance and gait training. Check for directional preferences.    PT Home Exercise Plan Supine bridge, clamshells, sidelying hip abductions, supine hip flexor stretch    Consulted and Agree with Plan of Care Patient      Patient will benefit from skilled therapeutic intervention in order to improve the following deficits and impairments:  Abnormal gait, Decreased endurance, Decreased strength, Decreased balance, Decreased mobility, Difficulty walking  Visit Diagnosis: Chronic midline low back pain without sciatica  Muscle weakness  Abnormality of gait     Problem List Patient Active Problem List   Diagnosis Date Noted  . Dry cough 10/25/2015  . Gait disturbance, post-stroke 10/05/2015  . Presbycusis of both ears   . Advanced care planning/counseling discussion 06/23/2015  . Skin lesion 06/23/2015  . Impaired mobility 04/23/2015  . HTN (hypertension) 03/18/2015  . Chronic diastolic heart failure, NYHA  class 1 ([place]) 03/18/2015  . Spastic hemiparesis affecting dominant side ([place]) 07/07/2014  . Left shoulder pain 03/16/2014  . Chest pressure 02/03/2014  . Medicare annual wellness visit, subsequent 11/06/2013  . Erectile dysfunction due to arterial insufficiency 02/05/2013  . Depression   . Palpitations 06/26/2012  . HLD (hyperlipidemia)   . Restless leg syndrome   . GERD (gastroesophageal reflux disease)   . BPH with obstruction/lower urinary tract symptoms   . History of CVA (cerebrovascular accident) 03/11/2012  . Hemiparesis affecting right side as late effect of stroke ([place]) 03/11/2012  . Type 2 diabetes, controlled, with neuropathy ([place]) 03/11/2012  . OSA on CPAP 03/11/2012  . CKD (chronic kidney disease) stage 3, GFR 30-59 ml/min 03/11/2012  . Migraines 03/11/2012    Collie Siad PT, DPT 10/16/2016, 11:48 AM  [facility] [address], [name], [address] Phone: [phone]   Fax:  [phone]  Name: NUMAIR SCHALOW, MD MRN: [id number] Date of Birth: [birth year]

## 2016-10-17 NOTE — Therapy (Signed)
[facility] [address] [name], [address] Phone: [phone]   Fax:  [phone]  Physical Therapy Treatment  Patient Details  Name: James Bell, James Bell MRN: [id number] Date of Birth: [birth year] Referring Provider: Lajean Manes James Bell  Encounter Date: 10/12/2016      PT End of Session - 10/17/16 0744    Visit Number 5   Number of Visits 9   Date for PT Re-Evaluation 11/08/16   PT Start Time 1107   PT Stop Time 1152   PT Time Calculation (min) 45 min   Activity Tolerance Patient tolerated treatment well   Behavior During Therapy [name] for tasks assessed/performed      Past Medical History:  Diagnosis Date  . Anemia   . Basal cell carcinoma of nose 2002   bridge of nose  . BPH with obstruction/lower urinary tract symptoms    failed flomax, uroxatral, myrbetriq - sees urology James Bell) pending videourodynamics  . CKD (chronic kidney disease) stage 3, GFR 30-59 ml/min 03/11/2012   Renal US 02/2014 - multiple kidney cysts   . CVA (cerebral infarction) 03/07/12   slurred speech; right hemplegia, left handed - completed PT 07/2014 (17 sessions)  . Depression    mild, on cymbalta per prior PCP  . Erectile dysfunction    per urology (prostaglandin injection)  . GERD (gastroesophageal reflux disease)    with sliding HH  . H/O hiatal hernia   . History of kidney problems 12/2010   lacerated left kidney after fall  . HLD (hyperlipidemia)   . Kidney cysts 02/2014   bilateral (renal US)  . Migraines 03/11/12   now rare  . OSA on CPAP    sleep study 01/2014 - severe, CPAP at 10, previously on nuvigil (Dr. [name])  . Palpitations 01/2012   holter WNL, rare PAC/PVCs  . Presbycusis of both ears 2016   rec amplification [facility])  . Restless leg syndrome    well controlled on mirapex  . TIA (transient ischemic attack)   . Type 2 diabetes, controlled, with neuropathy [name]) 2011   [name] in [facility]     Past Surgical History:  Procedure Laterality Date  . ABI  2007   WNL  . CARDIAC CATHETERIZATION  2004   normal; [place]  . CARDIOVASCULAR STRESS TEST  02/2014   WNL, low risk scan, EF 68%  . CAROTID ENDARTERECTOMY    . CATARACT EXTRACTION W/ INTRAOCULAR LENS IMPLANT  11//2012 - 08/2011  . COLONOSCOPY  03/2011   poor prep, unable to biopsy polyp in tic fold, rpt 3 mo [name])  . COLONOSCOPY  06/2011   mult polyps adenomatous, severe diverticulosis, rpt 3 yrs [name])  . CYSTOSCOPY WITH URETHRAL DILATATION N/A 07/24/2013   Procedure: CYSTOSCOPY WITH URETHRAL DILATATION;  Surgeon: [name], James Bell;  Location: [facility];  Service: Urology;  Laterality: N/A;  . ESOPHAGOGASTRODUODENOSCOPY  2006   duodenitis, medual HH, Grade 2 reflux, mild gastritis  . ESOPHAGOGASTRODUODENOSCOPY  07/2011   mild chronic gastritis  . [place]  . [place]; 2002   left, then repaired  . MRI brain  04/2010   WNL  . PROSTATE SURGERY  02/2009   PVP (laser)  . SKIN CANCER EXCISION  2002   bridge of nose, BCC  . TONSILLECTOMY AND ADENOIDECTOMY  ~ 1945  . TRANSURETHRAL INCISION OF PROSTATE N/A 07/24/2013   Procedure: TRANSURETHRAL INCISION OF THE PROSTATE (TUIP);  Surgeon: [name], James Bell;  Location: [facility];  Service: Urology;  Laterality: N/A;  . URETHRAL DILATION  2014   tannenbaum    There were no vitals filed for this visit.      Subjective Assessment - 10/17/16 0749    Subjective Patient reports he woke up the last few mornings and forgot he was having low back pain.   Pertinent History Patient has a history of L CVA impairing his R hand and LE function.    Limitations Standing;House hold activities;Walking   How long can you walk comfortably? Less than an hour.    Diagnostic tests X-rays    Patient Stated Goals To be able to wake up and stand for > 1 hour without pain    Currently in Pain? No/denies      There-Ex Supine single leg bridging x  5" holds for 3 sets of 8 repetitions per side (fatiguing/challenging bilaterally)   TRX sit to stands x 5 (easy) practiced 3 sets of 5 sit to stands without use of UEs with cuing for have anterior trunk lean to reduce demand on his LUE.   Lateral step ups onto plastic box with 2 risers -- 3 sets x 6 repetitions with HHA (to fatigue)                            PT Education - 10/17/16 0743    Education provided Yes   Education Details Plan of care to address hip strength and its relationship to his L shoulder pain.    Person(s) Educated Patient   Methods Explanation;Demonstration   Comprehension Verbalized understanding;Returned demonstration             PT Long Term Goals - 09/20/16 1318      PT LONG TERM GOAL #1   Title Patient will be able to stand for 1 hour without increase in pain to demonstrate improved tolerance for ADLs.    Time 7   Period Weeks   Status New     PT LONG TERM GOAL #2   Title Patient will report modified ODI score of less than 15% to demonstrate improved tolerance for ADLs.    Baseline 26%   Time 7   Period Weeks   Status New     PT LONG TERM GOAL #3   Title Patient will complete 5x sit to stand in less than 14 seconds to demonstrate improved LE strength.    Time 7   Period Weeks   Status New     PT LONG TERM GOAL #4   Title Patient will report worst pain score of less than 3/10 on VAS to demonstrate improved tolerance for ADLs.    Baseline 5/10   Time 7   Period Weeks   Status New               Plan - 10/17/16 0746    Clinical Impression Statement Patient demonstrates significant reliance on UEs for transfers and WBing during gait likely contributing to his L shoulder pain. He reports he forgot he was having back pain, which is an excellent prognosticator. He would benefit from additional LE strengthening to maintain his decrease in LBP and L shoulder pain.    Rehab Potential Good   Clinical Impairments  Affecting Rehab Potential Patient has a history of CVA impairing R side, history of diabetic peripheral neuropathy.    PT Frequency 2x / week   PT Duration  4 weeks   PT Treatment/Interventions Biofeedback;Cryotherapy;Electrical Stimulation;Moist Heat;Ultrasound;Neuromuscular re-education;Balance training;Therapeutic exercise;Therapeutic activities;Manual techniques;Taping;Stair training;Gait training;DME Instruction;Patient/family education   PT Next Visit Plan Progress hip strengthening, balance and gait training. Check for directional preferences.    PT Home Exercise Plan Supine bridge, clamshells, sidelying hip abductions, supine hip flexor stretch    Consulted and Agree with Plan of Care Patient      Patient will benefit from skilled therapeutic intervention in order to improve the following deficits and impairments:  Abnormal gait, Decreased endurance, Decreased strength, Decreased balance, Decreased mobility, Difficulty walking  Visit Diagnosis: Chronic midline low back pain without sciatica  Muscle weakness  Abnormality of gait     Problem List Patient Active Problem List   Diagnosis Date Noted  . Dry cough 10/25/2015  . Gait disturbance, post-stroke 10/05/2015  . Presbycusis of both ears   . Advanced care planning/counseling discussion 06/23/2015  . Skin lesion 06/23/2015  . Impaired mobility 04/23/2015  . HTN (hypertension) 03/18/2015  . Chronic diastolic heart failure, NYHA class 1 ([place]) 03/18/2015  . Spastic hemiparesis affecting dominant side ([place]) 07/07/2014  . Left shoulder pain 03/16/2014  . Chest pressure 02/03/2014  . Medicare annual wellness visit, subsequent 11/06/2013  . Erectile dysfunction due to arterial insufficiency 02/05/2013  . Depression   . Palpitations 06/26/2012  . HLD (hyperlipidemia)   . Restless leg syndrome   . GERD (gastroesophageal reflux disease)   . BPH with obstruction/lower urinary tract symptoms   . History of CVA (cerebrovascular  accident) 03/11/2012  . Hemiparesis affecting right side as late effect of stroke ([place]) 03/11/2012  . Type 2 diabetes, controlled, with neuropathy ([place]) 03/11/2012  . OSA on CPAP 03/11/2012  . CKD (chronic kidney disease) stage 3, GFR 30-59 ml/min 03/11/2012  . Migraines 03/11/2012    Kerman Passey, PT, DPT   Addendum - date of treatment 10/12/2016  10/17/2016, 7:50 AM  [facility] [address] [name], [address] Phone: [phone]   Fax:  [phone]  Name: James JEMISON, James Bell MRN: [id number] Date of Birth: 09-08-1931

## 2016-10-18 ENCOUNTER — Ambulatory Visit: Payer: Medicare Other | Admitting: Physical Therapy

## 2016-10-18 DIAGNOSIS — M6281 Muscle weakness (generalized): Secondary | ICD-10-CM | POA: Diagnosis not present

## 2016-10-18 DIAGNOSIS — G8929 Other chronic pain: Secondary | ICD-10-CM | POA: Diagnosis not present

## 2016-10-18 DIAGNOSIS — M545 Low back pain, unspecified: Secondary | ICD-10-CM

## 2016-10-18 DIAGNOSIS — R269 Unspecified abnormalities of gait and mobility: Secondary | ICD-10-CM | POA: Diagnosis not present

## 2016-10-18 NOTE — Therapy (Signed)
[facility] [address] [name], [address] Phone: [phone]   Fax:  [phone]  Physical Therapy Treatment  Patient Details  Name: James TWEET, MD MRN: [id number] Date of Birth: 1931-06-02 Referring Provider: Lajean Manes MD  Encounter Date: 10/18/2016      PT End of Session - 10/18/16 0948    Visit Number 6   Number of Visits 9   Date for PT Re-Evaluation 11/08/16   PT Start Time 0943   PT Stop Time 1013   PT Time Calculation (min) 30 min   Activity Tolerance Patient tolerated treatment well   Behavior During Therapy [facility] for tasks assessed/performed      Past Medical History:  Diagnosis Date  . Anemia   . Basal cell carcinoma of nose 2002   bridge of nose  . BPH with obstruction/lower urinary tract symptoms    failed flomax, uroxatral, myrbetriq - sees urology [name]) pending videourodynamics  . CKD (chronic kidney disease) stage 3, GFR 30-59 ml/min 03/11/2012   Renal US 02/2014 - multiple kidney cysts   . CVA (cerebral infarction) 03/07/12   slurred speech; right hemplegia, left handed - completed PT 07/2014 (17 sessions)  . Depression    mild, on cymbalta per prior PCP  . Erectile dysfunction    per urology (prostaglandin injection)  . GERD (gastroesophageal reflux disease)    with sliding HH  . H/O hiatal hernia   . History of kidney problems 12/2010   lacerated left kidney after fall  . HLD (hyperlipidemia)   . Kidney cysts 02/2014   bilateral (renal US)  . Migraines 03/11/12   now rare  . OSA on CPAP    sleep study 01/2014 - severe, CPAP at 10, previously on nuvigil (Dr. [name])  . Palpitations 01/2012   holter WNL, rare PAC/PVCs  . Presbycusis of both ears 2016   rec amplification [facility])  . Restless leg syndrome    well controlled on mirapex  . TIA (transient ischemic attack)   . Type 2 diabetes, controlled, with neuropathy [facility]) 2011   [name] in [facility]     Past Surgical History:  Procedure Laterality Date  . ABI  2007   WNL  . CARDIAC CATHETERIZATION  2004   normal; [place]  . CARDIOVASCULAR STRESS TEST  02/2014   WNL, low risk scan, EF 68%  . CAROTID ENDARTERECTOMY    . CATARACT EXTRACTION W/ INTRAOCULAR LENS IMPLANT  11//2012 - 08/2011  . COLONOSCOPY  03/2011   poor prep, unable to biopsy polyp in tic fold, rpt 3 mo [name])  . COLONOSCOPY  06/2011   mult polyps adenomatous, severe diverticulosis, rpt 3 yrs [name])  . CYSTOSCOPY WITH URETHRAL DILATATION N/A 07/24/2013   Procedure: CYSTOSCOPY WITH URETHRAL DILATATION;  Surgeon: [name], MD;  [facility];  Service: Urology;  Laterality: N/A;  . ESOPHAGOGASTRODUODENOSCOPY  2006   duodenitis, medual HH, Grade 2 reflux, mild gastritis  . ESOPHAGOGASTRODUODENOSCOPY  07/2011   mild chronic gastritis  . [place]  . [place]; 2002   left, then repaired  . MRI brain  04/2010   WNL  . PROSTATE SURGERY  02/2009   PVP (laser)  . SKIN CANCER EXCISION  2002   bridge of nose, BCC  . TONSILLECTOMY AND ADENOIDECTOMY  ~ 1945  . TRANSURETHRAL INCISION OF PROSTATE N/A 07/24/2013   Procedure: TRANSURETHRAL INCISION OF THE PROSTATE (TUIP);  Surgeon: [name], MD;  [facility];  Service: Urology;  Laterality: N/A;  . URETHRAL DILATION  2014   tannenbaum    There were no vitals filed for this visit.      Subjective Assessment - 10/18/16 0944    Subjective Patient reports he continues to have less back pain than when he started with therapy. He states his L shoulder is feeling better as well, though continues to bother him if he overuses it.    Pertinent History Patient has a history of L CVA impairing his R hand and LE function.    Limitations Standing;House hold activities;Walking   How long can you walk comfortably? Less than an hour.    Diagnostic tests X-rays    Patient Stated Goals To be able to wake up and stand for >  1 hour without pain    Currently in Pain? No/denies      Sit to stands without use of UEs x 5 (difficult and began to fatigue, on 2nd set educated patient he could use pillow or in this case push through his thighs to decrease demand on his L shoulder.)   Forward facing step ups with 2 risers x 12 per side with HHA (cuing for allowing his R knee to go through flexion to extension to increase the demand on his R quadricep  Lateral Step Ups with 1 riser required HHA (2 risers was too challenging) x 8 per side for 1 set (began to noticeably fatigue in R quadricep)   Provided the above and single leg bridging as part of strengthening program.                            PT Education - 10/18/16 1021    Education provided Yes   Education Details HEP progressions and discussion of walking group.    Person(s) Educated Patient   Methods Explanation   Comprehension Verbalized understanding             PT Long Term Goals - 09/20/16 1318      PT LONG TERM GOAL #1   Title Patient will be able to stand for 1 hour without increase in pain to demonstrate improved tolerance for ADLs.    Time 7   Period Weeks   Status New     PT LONG TERM GOAL #2   Title Patient will report modified ODI score of less than 15% to demonstrate improved tolerance for ADLs.    Baseline 26%   Time 7   Period Weeks   Status New     PT LONG TERM GOAL #3   Title Patient will complete 5x sit to stand in less than 14 seconds to demonstrate improved LE strength.    Time 7   Period Weeks   Status New     PT LONG TERM GOAL #4   Title Patient will report worst pain score of less than 3/10 on VAS to demonstrate improved tolerance for ADLs.    Baseline 5/10   Time 7   Period Weeks   Status New               Plan - 10/18/16 0948    Clinical Impression Statement Patient continues to report decreased pain in his lower back, he also reports decreased L shoulder pain. Patient  demonstrating decreased reliance on LUE for weightbearing during ambulation. He was provided with an HEP to continue to progress his  LE strengthening, with the anticipation he will continue to have decreased reliance on LUE for WBing during ambulation.    Rehab Potential Good   Clinical Impairments Affecting Rehab Potential Patient has a history of CVA impairing R side, history of diabetic peripheral neuropathy.    PT Frequency 2x / week   PT Duration 4 weeks   PT Treatment/Interventions Biofeedback;Cryotherapy;Electrical Stimulation;Moist Heat;Ultrasound;Neuromuscular re-education;Balance training;Therapeutic exercise;Therapeutic activities;Manual techniques;Taping;Stair training;Gait training;DME Instruction;Patient/family education   PT Next Visit Plan Progress hip strengthening, balance and gait training. Check for directional preferences.    PT Home Exercise Plan Supine bridge, clamshells, sidelying hip abductions, supine hip flexor stretch    Consulted and Agree with Plan of Care Patient      Patient will benefit from skilled therapeutic intervention in order to improve the following deficits and impairments:  Abnormal gait, Decreased endurance, Decreased strength, Decreased balance, Decreased mobility, Difficulty walking  Visit Diagnosis: Chronic midline low back pain without sciatica  Muscle weakness  Abnormality of gait     Problem List Patient Active Problem List   Diagnosis Date Noted  . Dry cough 10/25/2015  . Gait disturbance, post-stroke 10/05/2015  . Presbycusis of both ears   . Advanced care planning/counseling discussion 06/23/2015  . Skin lesion 06/23/2015  . Impaired mobility 04/23/2015  . HTN (hypertension) 03/18/2015  . Chronic diastolic heart failure, NYHA class 1 ([place]) 03/18/2015  . Spastic hemiparesis affecting dominant side ([place]) 07/07/2014  . Left shoulder pain 03/16/2014  . Chest pressure 02/03/2014  . Medicare annual wellness visit, subsequent  11/06/2013  . Erectile dysfunction due to arterial insufficiency 02/05/2013  . Depression   . Palpitations 06/26/2012  . HLD (hyperlipidemia)   . Restless leg syndrome   . GERD (gastroesophageal reflux disease)   . BPH with obstruction/lower urinary tract symptoms   . History of CVA (cerebrovascular accident) 03/11/2012  . Hemiparesis affecting right side as late effect of stroke ([place]) 03/11/2012  . Type 2 diabetes, controlled, with neuropathy ([place]) 03/11/2012  . OSA on CPAP 03/11/2012  . CKD (chronic kidney disease) stage 3, GFR 30-59 ml/min 03/11/2012  . Migraines 03/11/2012   Kerman Passey, PT, DPT    10/18/2016, 10:22 AM  [facility] [address] [name], [address] Phone: [phone]   Fax:  [phone]  Name: RITO PENG, MD MRN: [id number] Date of Birth: [birth year]

## 2016-10-19 DIAGNOSIS — L718 Other rosacea: Secondary | ICD-10-CM | POA: Diagnosis not present

## 2016-10-19 DIAGNOSIS — L570 Actinic keratosis: Secondary | ICD-10-CM | POA: Diagnosis not present

## 2016-10-19 DIAGNOSIS — L821 Other seborrheic keratosis: Secondary | ICD-10-CM | POA: Diagnosis not present

## 2016-10-19 DIAGNOSIS — L578 Other skin changes due to chronic exposure to nonionizing radiation: Secondary | ICD-10-CM | POA: Diagnosis not present

## 2016-10-25 ENCOUNTER — Encounter: Payer: Self-pay | Admitting: Neurology

## 2016-10-25 ENCOUNTER — Ambulatory Visit (INDEPENDENT_AMBULATORY_CARE_PROVIDER_SITE_OTHER): Payer: Medicare Other | Admitting: Neurology

## 2016-10-25 VITALS — BP 126/70 | HR 72 | Resp 20 | Ht 68.0 in | Wt 190.0 lb

## 2016-10-25 DIAGNOSIS — H04203 Unspecified epiphora, bilateral lacrimal glands: Secondary | ICD-10-CM | POA: Diagnosis not present

## 2016-10-25 DIAGNOSIS — G4733 Obstructive sleep apnea (adult) (pediatric): Secondary | ICD-10-CM | POA: Diagnosis not present

## 2016-10-25 DIAGNOSIS — Z9989 Dependence on other enabling machines and devices: Secondary | ICD-10-CM | POA: Diagnosis not present

## 2016-10-25 NOTE — Patient Instructions (Signed)
Keep up the good work! I will see you back in one year.

## 2016-10-25 NOTE — Progress Notes (Signed)
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 The patient is unaccompanied today. I last saw him on 07/22/2015, at which time he was compliant with CPAP therapy. He was in the emergency room in June 2016 for right-sided weakness and had workup for TIA including echocardiogram, Doppler, EEG and MRI, workup negative for an acute stroke like events. He was not drinking enough water and I encouraged him to be better hydrated.   Today, 10/25/2016 (all dictated new, as well as above notes, some dictation done in note pad or Word, outside of chart, may appear as copied):   I reviewed his CPAP compliance data from 07/25/2016 through 10/22/2016, which is a total of 90 days, during which time he used his machine 73 days with percent used days greater than 4 hours at 43%, indicating decline compliance and suboptimal compliance with an average usage for 4 hours and 15 minutes, residual AHI 2.6 per hour, leak acceptable with the 95th percentile at 12 L/m on a pressure of 9 cm with EPR. He reports using CPAP regularly, but may not always keep the mask on after going to the bathroom. His nocturia had improved after CPAP was started and now has to get up 2-3 time, vs. 4 times before. He continues to take Botox injections for his post stroke spasticity under Dr. Letta Pate, improved with R hand, working on weight loss. uses an exercise machine at home, with [employer]. Still not great with water intake he  admits. Daughter sees him every other week, other daughter in [place], nephew (brother's son) sees him once a week.   The patient's allergies, current medications, family history, past medical history, past social history, past surgical history and problem list were reviewed and updated as appropriate.   Previously (copied from previous notes for reference):   I saw him on 01/19/2015, at which time he reported compliance with CPAP therapy. He preferred a CPAP pressure of 9 cm as opposed to 8 cm. He was exercising regularly. He was receiving Botox injections for his post stroke spasticity under Dr. Letta Pate with good results. He was using a cane on his left side for mobility.   I reviewed his CPAP compliance data from 06/20/2015 through 07/19/2015 which is a total of 30 days during which time he used his machine 27 days with percent used days greater than 4 hours at 70%, indicating adequate compliance with an average usage of 4 hours and 41 minutes, residual AHI low at 1.9 per hour, leak at times high with the 95th percentile at 28.5 L/m on a pressure of 9 cm with EPR of 3.   I saw him on 01/08/2014, at which time I suggested we bring him back for sleep study. He had a split-night sleep study on 01/13/2014 and we talked about his sleep test results and based on the test results I had prescribed CPAP therapy for him. His sleep efficiency at baseline was 69.9% with a sleep latency of 15 minutes and wake after sleep onset of 21  minutes with severe sleep fragmentation noted. He had a markedly elevated arousal index. He had absence of slow-wave sleep and absence of REM sleep prior to CPAP. He had rare PVCs and PACs on EKG. He had no PLMS. He had mild intermittent snoring. He had a total AHI of 58.9 per hour, baseline oxygen saturation was 94%, nadir was 85% during non-REM sleep. He was titrated on CPAP therapy for the rest of the night. Sleep efficiency was 70.1%. Wake after sleep onset was 79.5 minutes with  mild to moderate sleep fragmentation noted. He achieved slow-wave sleep and REM sleep after CPAP. He was titrated from 5-8 cm. His AHI was 0 per hour on a pressure of 8. Supine REM sleep was achieved. Based on the test results are prescribed CPAP therapy at home. Of note, he no showed for an appointment on 01/14/2015.   I first met him on 04/04/2013 at which time I suggested an autoPAP trial for 30 days. His old CPAP machine was set for 10 cm, but was defective and he bought a new machine on his own, which was set at 7 cm, then increased to 9 cm, as I understand.   His typical bedtime is reported to be around 9 to 10 PM and usual wake time is around 5 AM. Sleep onset typically occurs within a few minutes. He reports feeling not well rested upon awakening. He wakes up on an average 2 in the middle of the night and has to go to the bathroom 2 times on a typical night. He denies recent morning headaches.   He reports excessive daytime somnolence (EDS) and His Epworth Sleepiness Score (ESS) is 15/24 today. He has been known to snore for the past many years. He had 2 sleep studies in the past and he has records today, which I reviewed: He had a baseline sleep study on 12/26/2007 at [facility] in [place], [place]. His overall AHI was 40 per hour. His oxygen nadir was 78%. Moderate to severe sleep fragmentation was noted and an increased arousal index. He had periodic leg movements of sleep. He had a CPAP titration study on 05/21/2008. He had periodic leg movements of sleep, oxygen nadir was 92% for the night.   He feels that the pressure is not adequate as he does not wake up rested and has daytime somnolence. He feels that he may be snoring despite using CPAP. He had another machine which could not be fixed. He goes to the gym and drives a car. He lives alone and cooks for himself. He uses a 4 prong cane on the left side to mobilize. He is up for Botox injections in the right upper extremity.       His Past Medical History Is Significant For: Past Medical History:  Diagnosis Date  . Anemia   . Basal cell carcinoma of nose 2002   bridge of nose  . BPH with obstruction/lower urinary tract symptoms    failed flomax, uroxatral, myrbetriq - sees urology [name]) pending videourodynamics  . CKD (chronic kidney disease) stage 3, GFR 30-59 ml/min 03/11/2012   Renal US 02/2014 - multiple kidney cysts   . CVA (cerebral infarction) 03/07/12   slurred speech; right hemplegia, left handed - completed PT 07/2014 (17 sessions)  . Depression    mild, on cymbalta per prior PCP  . Erectile dysfunction    per urology (prostaglandin injection)  . GERD (gastroesophageal reflux disease)    with sliding HH  . H/O hiatal  hernia   . History of kidney problems 12/2010   lacerated left kidney after fall  . HLD (hyperlipidemia)   . Kidney cysts 02/2014   bilateral (renal US)  . Migraines 03/11/12   now rare  . OSA on CPAP    sleep study 01/2014 - severe, CPAP at 10, previously on nuvigil (Dr. [name])  . Palpitations 01/2012   holter WNL, rare PAC/PVCs  . Presbycusis of both ears 2016   rec amplification [facility])  . Restless leg syndrome    well controlled on mirapex  . TIA (transient ischemic attack)   . Type 2 diabetes, controlled, with neuropathy [facility]) 2011   [name] in [place]    His Past Surgical History Is Significant For: Past Surgical History:  Procedure Laterality Date  . ABI  2007   WNL  . CARDIAC CATHETERIZATION  2004   normal; [place]  . CARDIOVASCULAR STRESS TEST  02/2014   WNL, low risk scan, EF 68%  . CAROTID ENDARTERECTOMY    . CATARACT EXTRACTION W/ INTRAOCULAR LENS IMPLANT  11//2012 - 08/2011  . COLONOSCOPY  03/2011   poor prep, unable to biopsy polyp in tic fold, rpt 3 mo [name])  . COLONOSCOPY  06/2011   mult polyps adenomatous, severe diverticulosis, rpt 3 yrs [name])  . CYSTOSCOPY WITH URETHRAL DILATATION N/A 07/24/2013   Procedure: CYSTOSCOPY WITH URETHRAL  DILATATION;  Surgeon: [name], MD;  Location: [facility];  Service: Urology;  Laterality: N/A;  . ESOPHAGOGASTRODUODENOSCOPY  2006   duodenitis, medual HH, Grade 2 reflux, mild gastritis  . ESOPHAGOGASTRODUODENOSCOPY  07/2011   mild chronic gastritis  . [place]  . [place]; 2002   left, then repaired  . MRI brain  04/2010   WNL  . PROSTATE SURGERY  02/2009   PVP (laser)  . SKIN CANCER EXCISION  2002   bridge of nose, [facility]  . TONSILLECTOMY AND ADENOIDECTOMY  ~ 1945  . TRANSURETHRAL INCISION OF PROSTATE N/A 07/24/2013   Procedure: TRANSURETHRAL INCISION OF THE PROSTATE (TUIP);  Surgeon: [name], MD;  Location: [facility];  Service: Urology;  Laterality: N/A;  . URETHRAL DILATION  2014   [facility]    His Family History Is Significant For: Family History  Problem Relation Age of Onset  . Stroke Father   . Lung cancer Father 62     smoker  . Stroke Brother   . Diabetes Brother 49  . Lung cancer Brother   . Alcoholism Sister   . Kidney disease Sister   . Coronary artery disease Neg Hx     His Social History Is Significant For: Social History   Social History  . Marital status: Unknown    Spouse name: N/A  . Number of children: 2  . Years of education: MD   Occupational History  . retired   .  Retired   Social History Main Topics  . Smoking status: Never Smoker  . Smokeless tobacco: Never Used     Comment: "used to smoke pipe when I was younger"  . Alcohol use 2.4 oz/week    2 Glasses of wine, 2 Shots of liquor per week     Comment: occasional  . Drug use: No  . Sexual activity: Yes   Other Topics Concern  . None   Social History Narrative   Caffeine: 1 cup coffee/day    Occupation: ophthalmologist, retired   Edu: MD   Activity: going to gym  3x/wk with trainer   Diet: some water, fruits/vegetables some, red meat a few times, fish 1x/wk   POA - Tyrone Schimke (Daughter)    His Allergies Are:  Allergies   Allergen Reactions  . Lisinopril Cough    tachycardia  . Nexium [Esomeprazole] Other (See Comments)    Headache  . Niacin And Related Other (See Comments)    flushing  :   His Current Medications Are:  Outpatient Encounter Prescriptions as of 10/25/2016  Medication Sig  . clopidogrel (PLAVIX) 75 MG tablet Take 1 tablet (75 mg total) by mouth daily.  . Coenzyme Q10 (COQ10) 150 MG CAPS Take 1 capsule by mouth 2 (two) times daily.  . diazepam (VALIUM) 5 MG tablet Take 1 tablet (5 mg total) by mouth every 8 (eight) hours as needed for muscle spasms.  . DULoxetine (CYMBALTA) 60 MG capsule Take 1 capsule (60 mg total) by mouth daily.  . Folic Acid-Vit B6-Vit B12 (FOLBEE) 2.5-25-1 MG TABS tablet Take 1 tablet by mouth daily.  Marland Kitchen gabapentin (NEURONTIN) 300 MG capsule Take 300 mg by mouth daily.  Marland Kitchen ibuprofen (ADVIL,MOTRIN) 200 MG tablet Take 400 mg by mouth every 6 (six) hours as needed for mild pain or moderate pain.  . Insulin Degludec (TRESIBA FLEXTOUCH) 200 UNIT/ML SOPN Inject 80 Units into the skin at bedtime.   . Insulin Pen Needle (NOVOFINE) 30G X 8 MM MISC Use to inject insulin once daily dx:E11.40  . levothyroxine (LEVOTHROID) 25 MCG tablet Take 1 tablet (25 mcg total) by mouth daily before breakfast. Per endo Dr Sharl Ma  . losartan (COZAAR) 25 MG tablet Take 1 tablet (25 mg total) by mouth daily.  . metoprolol tartrate (LOPRESSOR) 25 MG tablet Take 0.5 tablets (12.5 mg total) by mouth 2 (two) times daily.  . nitroGLYCERIN (NITROSTAT) 0.4 MG SL tablet DISSOLVE 1 TABLET UNDER THE TONGUE EVERY 5 MINUTES AS NEEDED FOR CHEST PAIN  . omega-3 acid ethyl esters (LOVAZA) 1 g capsule Take 2 capsules (2 g total) by mouth 2 (two) times daily.  Marland Kitchen omeprazole (PRILOSEC) 40 MG capsule Take 1 capsule (40 mg total) by mouth daily.  . pramipexole (MIRAPEX) 1 MG tablet Take 2 tablets (2 mg total) by mouth daily. At 3pm  . rosuvastatin (CRESTOR) 40 MG tablet Take 1 tablet (40 mg total) by mouth daily.  .  traZODone (DESYREL) 50 MG tablet Take 50 mg by mouth at bedtime as needed for sleep.  . vitamin C (ASCORBIC ACID) 500 MG tablet Take 1,000 mg by mouth daily.   . [DISCONTINUED] sitaGLIPtin-metformin (JANUMET) 50-1000 MG tablet Take 1 tablet by mouth 2 (two) times daily with a meal.  . [DISCONTINUED] Melatonin 5 MG CAPS Take 1 capsule by mouth at bedtime.   No facility-administered encounter medications on file as of 10/25/2016.   :  Review of Systems:  Out of a complete 14 point review of systems, all are reviewed and negative with the exception of these symptoms as listed below:  Review of Systems  Neurological:       Pt presents today to discuss his cpap usage. Pt says that he is interested in a smaller interface. He is using [facility] as a DME.    Objective:  Neurologic Exam  Physical Exam Physical Examination:   Vitals:   10/25/16 1535  BP: 126/70  Pulse: 72  Resp: 20   General Examination: The patient is a very pleasant 83 y.o. male in no acute distress. He appears well-developed and well-nourished  and well groomed.   HEENT: Normocephalic, atraumatic, pupils are equal, round and reactive to light and accommodation. Extraocular tracking is good without limitation to gaze excursion or nystagmus noted. Normal smooth pursuit is noted. Hearing is grossly intact, b/l hearing aids. Face isFairly symmetric, very mild right lower facial weakness noted, speech is minimally dysarthric and mildly hypophonic. No tremor is noted, neck is supple, oropharynx exam reveals mild mouth dryness, mild to moderate airway crowding.  Chest: Clear to auscultation without wheezing, rhonchi or crackles noted.  Heart: S1+S2+0, regular and normal without murmurs, rubs or gallops noted.   Abdomen: Soft, non-tender and non-distended with normal bowel sounds appreciated on auscultation.  Extremities: There is 1+ pitting edema in the right ankle, wearing an AFO on the right.  Skin: Warm and dry  without trophic changes noted.  Musculoskeletal: exam reveals no obvious joint deformities, tenderness or joint swelling or erythema.   Neurologically:  Mental status: The patient is awake, alert and oriented in all 4 spheres. His immediate and remote memory, attention, language skills and fund of knowledge are appropriate. There is no evidence of aphasia, agnosia, apraxia or anomia. Speech is clear with normal prosody and enunciation. Thought process is linear. Mood is normal and affect is normal.  Cranial nerves II - XII are as described above under HEENT exam. In addition: shoulder shrug is normal with equal shoulder height noted. Motor exam: Normal bulk, strength and tone is noted on the left, on the right he has 4 out of 5 strength in the upper and lower extremities, tone is increased in the right upper and right lower extremity.  Romberg is not testable safely. Reflexes are mildly asymmetrical, right-sided hyperreflexia is noted. Fine motor skills  are preserved on the left, impaired on the right. He stands with difficulty and has a 4 pronged cane that he uses on the left, mild circumduction on R. sensory exam is intact to light touch .         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 He has been on CPAP of 9 cm, compliance was a little better last year in terms of overall average usage, he has a tendency to not always put the mask back on when he comes back from the bathroom at night. He is motivated to do better with that. He is also encouraged to be better hydrated with water. He is encouraged to continue with full compliance of CPAP, is trying to lose weight and is commended for this. He exercises regularly and also receives regular  Botox injections for post stroke spasticity in the right upper extremity. I suggested a one-year checkup , sooner as needed. I answered all his questions today and he was in agreement. I provided an order for CPAP related supplies and we will send to his DME company.  I spent 20 minutes in total face-to-face time with the patient, more than 50% of which was spent in counseling and coordination of care, reviewing test results, reviewing medication and discussing or reviewing the diagnosis of OSA, its prognosis and treatment options. Pertinent laboratory and imaging test results that were available during this visit with the patient were reviewed by me and considered in my medical decision making (see chart for details).

## 2016-10-26 ENCOUNTER — Encounter: Payer: Medicare Other | Admitting: Physical Therapy

## 2016-10-30 ENCOUNTER — Ambulatory Visit: Payer: Medicare Other | Attending: Geriatric Medicine | Admitting: Physical Therapy

## 2016-10-30 DIAGNOSIS — M6281 Muscle weakness (generalized): Secondary | ICD-10-CM | POA: Insufficient documentation

## 2016-10-30 DIAGNOSIS — M545 Low back pain: Secondary | ICD-10-CM | POA: Insufficient documentation

## 2016-10-30 DIAGNOSIS — G8929 Other chronic pain: Secondary | ICD-10-CM | POA: Insufficient documentation

## 2016-10-30 DIAGNOSIS — M25512 Pain in left shoulder: Secondary | ICD-10-CM | POA: Insufficient documentation

## 2016-10-30 DIAGNOSIS — R269 Unspecified abnormalities of gait and mobility: Secondary | ICD-10-CM | POA: Insufficient documentation

## 2016-10-31 ENCOUNTER — Encounter: Payer: Self-pay | Admitting: Family Medicine

## 2016-10-31 DIAGNOSIS — L719 Rosacea, unspecified: Secondary | ICD-10-CM | POA: Insufficient documentation

## 2016-11-02 ENCOUNTER — Ambulatory Visit: Payer: Medicare Other | Admitting: Physical Therapy

## 2016-11-02 DIAGNOSIS — G8929 Other chronic pain: Secondary | ICD-10-CM

## 2016-11-02 DIAGNOSIS — M545 Low back pain, unspecified: Secondary | ICD-10-CM

## 2016-11-02 DIAGNOSIS — R269 Unspecified abnormalities of gait and mobility: Secondary | ICD-10-CM | POA: Diagnosis not present

## 2016-11-02 DIAGNOSIS — M6281 Muscle weakness (generalized): Secondary | ICD-10-CM

## 2016-11-02 DIAGNOSIS — M25512 Pain in left shoulder: Secondary | ICD-10-CM | POA: Diagnosis not present

## 2016-11-02 NOTE — Patient Instructions (Addendum)
Modified ODI - 8%  L shoulder flexion - 161 degrees  Abduction to 170 degrees   MMT 5/5 for all shoulder tests   Belly Press - not true positive   Palpation painful on lateral UT/supraspinatus (performed STM, educated to perform STM with tennis ball-- had to modify given his tone in R hand)   Provided supine bridging, sit to stands without hands, isometric holds in standing hip abduction  5x sit to stand without hands 25.73 seconds  **Patient reports he has been exercising earlier today and reports he is fatigued after testing and would prefer easier session on this date.

## 2016-11-06 ENCOUNTER — Ambulatory Visit: Payer: Medicare Other | Admitting: Physical Therapy

## 2016-11-06 DIAGNOSIS — R269 Unspecified abnormalities of gait and mobility: Secondary | ICD-10-CM | POA: Diagnosis not present

## 2016-11-06 DIAGNOSIS — M25512 Pain in left shoulder: Secondary | ICD-10-CM | POA: Diagnosis not present

## 2016-11-06 DIAGNOSIS — G8929 Other chronic pain: Secondary | ICD-10-CM

## 2016-11-06 DIAGNOSIS — M6281 Muscle weakness (generalized): Secondary | ICD-10-CM | POA: Diagnosis not present

## 2016-11-06 DIAGNOSIS — M545 Low back pain, unspecified: Secondary | ICD-10-CM

## 2016-11-06 NOTE — Therapy (Signed)
[place] [facility] [address] [name], [address] Phone: [phone]   Fax:  [phone]  Physical Therapy Treatment  Patient Details  Name: James IMPERATO, MD MRN: [id number] Date of Birth: [birth year] Referring Provider: Lajean Manes MD  Encounter Date: 11/02/2016      PT End of Session - 11/06/16 1006    Visit Number 7   Number of Visits 15   Date for PT Re-Evaluation 12/18/16   PT Start Time 1630   PT Stop Time 1710   PT Time Calculation (min) 40 min   Activity Tolerance Patient tolerated treatment well   Behavior During Therapy [facility] for tasks assessed/performed      Past Medical History:  Diagnosis Date  . Anemia   . Basal cell carcinoma of nose 2002   bridge of nose  . BPH with obstruction/lower urinary tract symptoms    failed flomax, uroxatral, myrbetriq - sees urology [name]) pending videourodynamics  . CKD (chronic kidney disease) stage 3, GFR 30-59 ml/min 03/11/2012   Renal US 02/2014 - multiple kidney cysts   . CVA (cerebral infarction) 03/07/12   slurred speech; right hemplegia, left handed - completed PT 07/2014 (17 sessions)  . Depression    mild, on cymbalta per prior PCP  . Erectile dysfunction    per urology (prostaglandin injection)  . GERD (gastroesophageal reflux disease)    with sliding HH  . H/O hiatal hernia   . History of kidney problems 12/2010   lacerated left kidney after fall  . HLD (hyperlipidemia)   . Kidney cysts 02/2014   bilateral (renal US)  . Migraines 03/11/12   now rare  . OSA on CPAP    sleep study 01/2014 - severe, CPAP at 10, previously on nuvigil (Dr. [name])  . Palpitations 01/2012   holter WNL, rare PAC/PVCs  . Presbycusis of both ears 2016   rec amplification [facility])  . Restless leg syndrome    well controlled on mirapex  . Rosacea    Kowalski  . TIA (transient ischemic attack)   . Type 2 diabetes, controlled, with neuropathy [facility]) 2011    [name] in [facility]    Past Surgical History:  Procedure Laterality Date  . ABI  2007   WNL  . CARDIAC CATHETERIZATION  2004   normal; [place]  . CARDIOVASCULAR STRESS TEST  02/2014   WNL, low risk scan, EF 68%  . CAROTID ENDARTERECTOMY    . CATARACT EXTRACTION W/ INTRAOCULAR LENS IMPLANT  11//2012 - 08/2011  . COLONOSCOPY  03/2011   poor prep, unable to biopsy polyp in tic fold, rpt 3 mo [name])  . COLONOSCOPY  06/2011   mult polyps adenomatous, severe diverticulosis, rpt 3 yrs [name])  . CYSTOSCOPY WITH URETHRAL DILATATION N/A 07/24/2013   Procedure: CYSTOSCOPY WITH URETHRAL DILATATION;  Surgeon: [name], MD;  Location: [facility];  Service: Urology;  Laterality: N/A;  . ESOPHAGOGASTRODUODENOSCOPY  2006   duodenitis, medual HH, Grade 2 reflux, mild gastritis  . ESOPHAGOGASTRODUODENOSCOPY  07/2011   mild chronic gastritis  . [place]  . [place]; 2002   left, then repaired  . MRI brain  04/2010   WNL  . PROSTATE SURGERY  02/2009   PVP (laser)  . SKIN CANCER EXCISION  2002   bridge of nose, BCC  . TONSILLECTOMY AND ADENOIDECTOMY  ~ 1945  . TRANSURETHRAL INCISION OF PROSTATE N/A 07/24/2013   Procedure:  TRANSURETHRAL INCISION OF THE PROSTATE (TUIP);  Surgeon: [name], MD;  Location: [facility];  Service: Urology;  Laterality: N/A;  . URETHRAL DILATION  2014   tannenbaum    There were no vitals filed for this visit.      Subjective Assessment - 11/06/16 1005    Subjective Patient reports he has continued to have relief of low back pain, however L shoulder has progressively been getting more painful.    Pertinent History Patient has a history of L CVA impairing his R hand and LE function.    Limitations Standing;House hold activities;Walking   How long can you walk comfortably? Less than an hour.    Diagnostic tests X-rays    Patient Stated Goals To be able to wake up and stand for > 1 hour without pain    Currently in  Pain? Yes  Does not rate pain but reports it is increasing in severity in L shoulder      Modified ODI - 8%  L shoulder flexion - 161 degrees  Abduction to 170 degrees   MMT 5/5 for all shoulder tests   Belly Press - not true positive   QuickDash - 43.75%  Palpation painful on lateral UT/supraspinatus (performed STM, educated to perform STM with tennis ball-- had to modify given his tone in R hand)   Provided supine bridging, sit to stands without hands, isometric holds in standing hip abduction  5x sit to stand without hands 25.73 seconds  **Patient reports he has been exercising earlier today and reports he is fatigued after testing and would prefer easier session on this date.                                  PT Long Term Goals - 11/02/16 1623      PT LONG TERM GOAL #1   Title Patient will be able to stand for 1 hour without increase in pain to demonstrate improved tolerance for ADLs.    Baseline 20 minutes without increase in back pain.    Time 7   Period Weeks   Status Partially Met     PT LONG TERM GOAL #2   Title Patient will report modified ODI score of less than 15% to demonstrate improved tolerance for ADLs.    Baseline 26%   Time 7   Period Weeks   Status New     PT LONG TERM GOAL #3   Title Patient will complete 5x sit to stand in less than 14 seconds to demonstrate improved LE strength.    Baseline 5x sit to stand in 25.73 seconds.    Time 7   Period Weeks   Status Partially Met     PT LONG TERM GOAL #4   Title Patient will report worst pain score of less than 3/10 on VAS to demonstrate improved tolerance for ADLs.    Baseline 5/10 at baseline, on 11/02/2016 he reports he has had this level of pain once in the past week, otherwise minimal to no pain.    Time 7   Period Weeks   Status Partially Met             Patient will benefit from skilled therapeutic intervention in order to improve the following deficits and  impairments:  Abnormal gait, Decreased endurance, Decreased strength, Decreased balance, Decreased mobility, Difficulty walking  Visit Diagnosis: Chronic midline low back pain without sciatica -  Plan: PT plan of care cert/re-cert  Muscle weakness - Plan: PT plan of care cert/re-cert  Chronic left shoulder pain - Plan: PT plan of care cert/re-cert       G-Codes - 12/04/16 1014    Functional Limitation Mobility: Walking and moving around   Mobility: Walking and Moving Around Current Status [phone]) At least 1 percent but less than 20 percent impaired, limited or restricted   Mobility: Walking and Moving Around Goal Status ([id number]) At least 1 percent but less than 20 percent impaired, limited or restricted      Problem List Patient Active Problem List   Diagnosis Date Noted  . Rosacea   . Dry cough 10/25/2015  . Gait disturbance, post-stroke 10/05/2015  . Presbycusis of both ears   . Advanced care planning/counseling discussion 06/23/2015  . Skin lesion 06/23/2015  . Impaired mobility 04/23/2015  . HTN (hypertension) 03/18/2015  . Chronic diastolic heart failure, NYHA class 1 ([place]) 03/18/2015  . Spastic hemiparesis affecting dominant side ([place]) 07/07/2014  . Left shoulder pain 03/16/2014  . Chest pressure 02/03/2014  . Medicare annual wellness visit, subsequent 2013-12-04  . Erectile dysfunction due to arterial insufficiency 02/05/2013  . Depression   . Palpitations 06/26/2012  . HLD (hyperlipidemia)   . Restless leg syndrome   . GERD (gastroesophageal reflux disease)   . BPH with obstruction/lower urinary tract symptoms   . History of CVA (cerebrovascular accident) 03/11/2012  . Hemiparesis affecting right side as late effect of stroke ([place]) 03/11/2012  . Type 2 diabetes, controlled, with neuropathy ([place]) 03/11/2012  . OSA on CPAP 03/11/2012  . CKD (chronic kidney disease) stage 3, GFR 30-59 ml/min 03/11/2012  . Migraines 03/11/2012    Royce Macadamia Dec 04, 2016, 11:35  AM  [place] [facility] [address] [name], [address] Phone: [phone]   Fax:  [phone]  Name: GARED GILLIE, MD MRN: [id number] Date of Birth: 20-Dec-1930

## 2016-11-06 NOTE — Therapy (Signed)
[place] [facility] [address] [name], [address] Phone: [phone]   Fax:  [phone]  Physical Therapy Treatment  Patient Details  Name: James BUTNER, James Bell MRN: [id number] Date of Birth: 1930-09-20 Referring Provider: Lajean Manes James Bell  Encounter Date: 11/06/2016      PT End of Session - 11/06/16 1417    Visit Number 8   Number of Visits 15   Date for PT Re-Evaluation 12/18/16   PT Start Time 6962   PT Stop Time 1422   PT Time Calculation (min) 40 min   Activity Tolerance Patient tolerated treatment well   Behavior During Therapy [facility] for tasks assessed/performed      Past Medical History:  Diagnosis Date  . Anemia   . Basal cell carcinoma of nose 2002   bridge of nose  . BPH with obstruction/lower urinary tract symptoms    failed flomax, uroxatral, myrbetriq - sees urology [name]) pending videourodynamics  . CKD (chronic kidney disease) stage 3, GFR 30-59 ml/min 03/11/2012   Renal US 02/2014 - multiple kidney cysts   . CVA (cerebral infarction) 03/07/12   slurred speech; right hemplegia, left handed - completed PT 07/2014 (17 sessions)  . Depression    mild, on cymbalta per prior PCP  . Erectile dysfunction    per urology (prostaglandin injection)  . GERD (gastroesophageal reflux disease)    with sliding HH  . H/O hiatal hernia   . History of kidney problems 12/2010   lacerated left kidney after fall  . HLD (hyperlipidemia)   . Kidney cysts 02/2014   bilateral (renal US)  . Migraines 03/11/12   now rare  . OSA on CPAP    sleep study 01/2014 - severe, CPAP at 10, previously on nuvigil (Dr. [name])  . Palpitations 01/2012   holter WNL, rare PAC/PVCs  . Presbycusis of both ears 2016   rec amplification [facility])  . Restless leg syndrome    well controlled on mirapex  . Rosacea    [place]ow[place]ki  . TIA (transient ischemic attack)   . Type 2 diabetes, controlled, with neuropathy [name]) 2011    [name] in [facility]    Past Surgical History:  Procedure Laterality Date  . ABI  2007   WNL  . CARDIAC CATHETERIZATION  2004   normal; [place]  . CARDIOVASCULAR STRESS TEST  02/2014   WNL, low risk scan, EF 68%  . CAROTID ENDARTERECTOMY    . CATARACT EXTRACTION W/ INTRAOCULAR LENS IMPLANT  11//2012 - 08/2011  . COLONOSCOPY  03/2011   poor prep, unable to biopsy polyp in tic fold, rpt 3 mo [name])  . COLONOSCOPY  06/2011   mult polyps adenomatous, severe diverticulosis, rpt 3 yrs [name])  . CYSTOSCOPY WITH URETHRAL DILATATION N/A 07/24/2013   Procedure: CYSTOSCOPY WITH URETHRAL DILATATION;  Surgeon: [name], James Bell;  [facility];  Service: Urology;  Laterality: N/A;  . ESOPHAGOGASTRODUODENOSCOPY  2006   duodenitis, medual HH, Grade 2 reflux, mild gastritis  . ESOPHAGOGASTRODUODENOSCOPY  07/2011   mild chronic gastritis  . [place]  . [place]; 2002   left, then repaired  . MRI brain  04/2010   WNL  . PROSTATE SURGERY  02/2009   PVP (laser)  . SKIN CANCER EXCISION  2002   bridge of nose, BCC  . TONSILLECTOMY AND ADENOIDECTOMY  ~ 1945  . TRANSURETHRAL INCISION OF PROSTATE N/A 07/24/2013   Procedure:  TRANSURETHRAL INCISION OF THE PROSTATE (TUIP);  Surgeon: [name], James Bell;  [facility];  Service: Urology;  Laterality: N/A;  . URETHRAL DILATION  2014   tannenbaum    There were no vitals filed for this visit.      Subjective Assessment - 11/06/16 1347    Subjective Patient reports he had a good weekend. Patient reports the self massage has been helpful, but has not fully relieved his symptoms.    Pertinent History Patient has a history of L CVA impairing his R hand and LE function.    Limitations Standing;House hold activities;Walking   How long can you walk comfortably? Less than an hour.    Diagnostic tests X-rays    Patient Stated Goals To be able to wake up and stand for > 1 hour without pain    Currently in  Pain? Yes      Leg Press with RLE only 45# x 8 for 4 repetitions   Sit to stand x 10 for 3 sets with HHA for 3 sets   OMEGA matrix hip RLE stance x 25#, LLE stance x 10# for 8 repetitions for 3 sets (patient reports fatigue after this)  Side stepping with red t-band x 2 bouts of 2 laps (challenging on LEs)                            PT Education - 11/06/16 1417    Education provided Yes   Education Details Will continue to progress LE strengthening to decrease LUE demands.    Person(s) Educated Patient   Methods Explanation;Demonstration   Comprehension Verbalized understanding;Returned demonstration             PT Long Term Goals - 11/02/16 1623      PT LONG TERM GOAL #1   Title Patient will be able to stand for 1 hour without increase in pain to demonstrate improved tolerance for ADLs.    Baseline 20 minutes without increase in back pain.    Time 7   Period Weeks   Status Partially Met     PT LONG TERM GOAL #2   Title Patient will report modified ODI score of less than 15% to demonstrate improved tolerance for ADLs.    Baseline 26%   Time 7   Period Weeks   Status New     PT LONG TERM GOAL #3   Title Patient will complete 5x sit to stand in less than 14 seconds to demonstrate improved LE strength.    Baseline 5x sit to stand in 25.73 seconds.    Time 7   Period Weeks   Status Partially Met     PT LONG TERM GOAL #4   Title Patient will report worst pain score of less than 3/10 on VAS to demonstrate improved tolerance for ADLs.    Baseline 5/10 at baseline, on 11/02/2016 he reports he has had this level of pain once in the past week, otherwise minimal to no pain.    Time 7   Period Weeks   Status Partially Met               Plan - 11/06/16 1417    Clinical Impression Statement Patient tolerating progressions in LE strengthening, he is reporting that soft tissue mobilization has been beneficial thus far. He continues to show  marked RLE strength deficits which will require intensive strengthening to decrease WBing demands on LUE.  Rehab Potential Good   Clinical Impairments Affecting Rehab Potential Patient has a history of CVA impairing R side, history of diabetic peripheral neuropathy.    PT Frequency 2x / week   PT Duration 4 weeks   PT Treatment/Interventions Biofeedback;Cryotherapy;Electrical Stimulation;Moist Heat;Ultrasound;Neuromuscular re-education;Balance training;Therapeutic exercise;Therapeutic activities;Manual techniques;Taping;Stair training;Gait training;DME Instruction;Patient/family education   PT Next Visit Plan Progress hip strengthening, balance and gait training. Check for directional preferences.    PT Home Exercise Plan Supine bridge, clamshells, sidelying hip abductions, supine hip flexor stretch    Consulted and Agree with Plan of Care Patient      Patient will benefit from skilled therapeutic intervention in order to improve the following deficits and impairments:  Abnormal gait, Decreased endurance, Decreased strength, Decreased balance, Decreased mobility, Difficulty walking  Visit Diagnosis: Chronic midline low back pain without sciatica  Chronic left shoulder pain  Muscle weakness       G-Codes - 12/01/2016 1014    Functional Limitation Mobility: Walking and moving around   Mobility: Walking and Moving Around Current Status [phone]) At least 1 percent but less than 20 percent impaired, limited or restricted   Mobility: Walking and Moving Around Goal Status [phone]) At least 1 percent but less than 20 percent impaired, limited or restricted      Problem List Patient Active Problem List   Diagnosis Date Noted  . Rosacea   . Dry cough 10/25/2015  . Gait disturbance, post-stroke 10/05/2015  . Presbycusis of both ears   . Advanced care planning/counseling discussion 06/23/2015  . Skin lesion 06/23/2015  . Impaired mobility 04/23/2015  . HTN (hypertension) 03/18/2015  .  Chronic diastolic heart failure, NYHA class 1 ([place]) 03/18/2015  . Spastic hemiparesis affecting dominant side ([place]) 07/07/2014  . Left shoulder pain 03/16/2014  . Chest pressure 02/03/2014  . Medicare annual wellness visit, subsequent 12/01/2013  . Erectile dysfunction due to arterial insufficiency 02/05/2013  . Depression   . Palpitations 06/26/2012  . HLD (hyperlipidemia)   . Restless leg syndrome   . GERD (gastroesophageal reflux disease)   . BPH with obstruction/lower urinary tract symptoms   . History of CVA (cerebrovascular accident) 03/11/2012  . Hemiparesis affecting right side as late effect of stroke ([place]) 03/11/2012  . Type 2 diabetes, controlled, with neuropathy ([place]) 03/11/2012  . OSA on CPAP 03/11/2012  . CKD (chronic kidney disease) stage 3, GFR 30-59 ml/min 03/11/2012  . Migraines 03/11/2012   Royce Macadamia PT, DPT, CSCS    2016/12/01, 2:28 PM  [place] [facility] [address] [name], [address] Phone: [phone]   Fax:  [phone]  Name: James WADDILL, James Bell MRN: [id number] Date of Birth: 02-Feb-1931

## 2016-11-06 NOTE — Patient Instructions (Signed)
Leg Press with RLE only 45# x 8 for 4 repetitions   Sit to stand x 10 for 3 sets with HHA for 3 sets   OMEGA matrix hip RLE stance x 25#, LLE stance x 10# for 8 repetitions for 3 sets

## 2016-11-09 ENCOUNTER — Ambulatory Visit: Payer: Medicare Other | Admitting: Physical Therapy

## 2016-11-09 DIAGNOSIS — M545 Other chronic pain: Secondary | ICD-10-CM

## 2016-11-09 DIAGNOSIS — M6281 Muscle weakness (generalized): Secondary | ICD-10-CM | POA: Diagnosis not present

## 2016-11-09 DIAGNOSIS — R269 Unspecified abnormalities of gait and mobility: Secondary | ICD-10-CM | POA: Diagnosis not present

## 2016-11-09 DIAGNOSIS — G8929 Other chronic pain: Secondary | ICD-10-CM

## 2016-11-09 DIAGNOSIS — M25512 Pain in left shoulder: Secondary | ICD-10-CM

## 2016-11-09 NOTE — Therapy (Signed)
[place] [facility] [address], [name], [address] Phone: [phone]   Fax:  [phone]  Physical Therapy Treatment  Patient Details  Name: James VARDEN, James Bell MRN: [id number] Date of Birth: 1931-06-14 Referring Provider: Lajean Manes James Bell  Encounter Date: 11/09/2016      PT End of Session - 11/09/16 1440    Visit Number 9   Number of Visits 15   Date for PT Re-Evaluation 12/18/16   PT Start Time 1324   PT Stop Time 1504   PT Time Calculation (min) 40 min   Activity Tolerance Patient tolerated treatment well   Behavior During Therapy [facility] for tasks assessed/performed      Past Medical History:  Diagnosis Date  . Anemia   . Basal cell carcinoma of nose 2002   bridge of nose  . BPH with obstruction/lower urinary tract symptoms    failed flomax, uroxatral, myrbetriq - sees urology [name]) pending videourodynamics  . CKD (chronic kidney disease) stage 3, GFR 30-59 ml/min 03/11/2012   Renal US 02/2014 - multiple kidney cysts   . CVA (cerebral infarction) 03/07/12   slurred speech; right hemplegia, left handed - completed PT 07/2014 (17 sessions)  . Depression    mild, on cymbalta per prior PCP  . Erectile dysfunction    per urology (prostaglandin injection)  . GERD (gastroesophageal reflux disease)    with sliding HH  . H/O hiatal hernia   . History of kidney problems 12/2010   lacerated left kidney after fall  . HLD (hyperlipidemia)   . Kidney cysts 02/2014   bilateral (renal US)  . Migraines 03/11/12   now rare  . OSA on CPAP    sleep study 01/2014 - severe, CPAP at 10, previously on nuvigil (Dr. [name])  . Palpitations 01/2012   holter WNL, rare PAC/PVCs  . Presbycusis of both ears 2016   rec amplification [facility])  . Restless leg syndrome    well controlled on mirapex  . Rosacea    [place]ki  . TIA (transient ischemic attack)   . Type 2 diabetes, controlled, with neuropathy [facility]) 2011    [name] in [facility]    Past Surgical History:  Procedure Laterality Date  . ABI  2007   WNL  . CARDIAC CATHETERIZATION  2004   normal; [place]  . CARDIOVASCULAR STRESS TEST  02/2014   WNL, low risk scan, EF 68%  . CAROTID ENDARTERECTOMY    . CATARACT EXTRACTION W/ INTRAOCULAR LENS IMPLANT  11//2012 - 08/2011  . COLONOSCOPY  03/2011   poor prep, unable to biopsy polyp in tic fold, rpt 3 mo [name])  . COLONOSCOPY  06/2011   mult polyps adenomatous, severe diverticulosis, rpt 3 yrs [name])  . CYSTOSCOPY WITH URETHRAL DILATATION N/A 07/24/2013   Procedure: CYSTOSCOPY WITH URETHRAL DILATATION;  Surgeon: [name], James Bell;  Location: [facility];  Service: Urology;  Laterality: N/A;  . ESOPHAGOGASTRODUODENOSCOPY  2006   duodenitis, medual HH, Grade 2 reflux, mild gastritis  . ESOPHAGOGASTRODUODENOSCOPY  07/2011   mild chronic gastritis  . [place]  . [place]; 2002   left, then repaired  . MRI brain  04/2010   WNL  . PROSTATE SURGERY  02/2009   PVP (laser)  . SKIN CANCER EXCISION  2002   bridge of nose, BCC  . TONSILLECTOMY AND ADENOIDECTOMY  ~ 1945  . TRANSURETHRAL INCISION OF PROSTATE N/A 07/24/2013   Procedure:  TRANSURETHRAL INCISION OF THE PROSTATE (TUIP);  Surgeon: [name], James Bell;  Location: [facility];  Service: Urology;  Laterality: N/A;  . URETHRAL DILATION  2014   tannenbaum    There were no vitals filed for this visit.      Subjective Assessment - 11/09/16 1426    Subjective Patient reports his back has been feeling ok. He was sore for 2 days after the last PT session. He reports his L shoulder is bothering him a bit today as he has been doing a lot of walking (relies on L UT and shoulder musculature for WBing during gait).    Pertinent History Patient has a history of L CVA impairing his R hand and LE function.    Limitations Standing;House hold activities;Walking   How long can you walk comfortably? Less than an hour.     Diagnostic tests X-rays    Patient Stated Goals To be able to wake up and stand for > 1 hour without pain    Currently in Pain? Yes  Reports mild to moderate discomfort in lateral superior shoulder by [facility] joint in the area of supraspinatus tendon      TRX sit to stands (noted minimal reliance on his UEs) 3 sets x 10 repetitions (noted to be able to stand from green chair without use of UEs to complete session).   Standing hip abduction with HHA x2 for 1 set x 10 with red t-band, with less HHA x 10 with red t-band for 2nd set, for 3rd set with green t-band   Leg Press at 85# x 10, 95# x 12 for 2 sets (quite challenging, encouraged full knee extension on RLE)   ** at this point patient reports his legs are beginning to fatigue, PT opted to focus on pain modulation with soft tissue mobilization of L lateral shoulder where he was reporting pain. Reported relief of some of the pain he was experiencing.                              PT Education - 11/09/16 1438    Education provided Yes   Education Details Improving his RLE WBing through stance.    Person(s) Educated Patient   Methods Explanation;Demonstration   Comprehension Verbalized understanding;Returned demonstration             PT Long Term Goals - 11/02/16 1623      PT LONG TERM GOAL #1   Title Patient will be able to stand for 1 hour without increase in pain to demonstrate improved tolerance for ADLs.    Baseline 20 minutes without increase in back pain.    Time 7   Period Weeks   Status Partially Met     PT LONG TERM GOAL #2   Title Patient will report modified ODI score of less than 15% to demonstrate improved tolerance for ADLs.    Baseline 26%   Time 7   Period Weeks   Status New     PT LONG TERM GOAL #3   Title Patient will complete 5x sit to stand in less than 14 seconds to demonstrate improved LE strength.    Baseline 5x sit to stand in 25.73 seconds.    Time 7   Period Weeks   Status  Partially Met     PT LONG TERM GOAL #4   Title Patient will report worst pain score of less than 3/10 on VAS to demonstrate  improved tolerance for ADLs.    Baseline 5/10 at baseline, on 11/02/2016 he reports he has had this level of pain once in the past week, otherwise minimal to no pain.    Time 7   Period Weeks   Status Partially Met               Plan - 11/09/16 1501    Clinical Impression Statement Patient had to ambulate further distance with rougher terrain this date thus had increased fatigue. Outside of this, he reports his shoulder has been feeling much better, on observation of his gait he is relying less and less on his LUE for WBing.    Rehab Potential Good   Clinical Impairments Affecting Rehab Potential Patient has a history of CVA impairing R side, history of diabetic peripheral neuropathy.    PT Frequency 2x / week   PT Duration 4 weeks   PT Treatment/Interventions Biofeedback;Cryotherapy;Electrical Stimulation;Moist Heat;Ultrasound;Neuromuscular re-education;Balance training;Therapeutic exercise;Therapeutic activities;Manual techniques;Taping;Stair training;Gait training;DME Instruction;Patient/family education   PT Next Visit Plan Progress hip strengthening, balance and gait training. Check for directional preferences.    PT Home Exercise Plan Supine bridge, clamshells, sidelying hip abductions, supine hip flexor stretch    Consulted and Agree with Plan of Care Patient      Patient will benefit from skilled therapeutic intervention in order to improve the following deficits and impairments:  Abnormal gait, Decreased endurance, Decreased strength, Decreased balance, Decreased mobility, Difficulty walking  Visit Diagnosis: Chronic midline low back pain without sciatica  Chronic left shoulder pain  Muscle weakness     Problem List Patient Active Problem List   Diagnosis Date Noted  . Rosacea   . Dry cough 10/25/2015  . Gait disturbance, post-stroke  10/05/2015  . Presbycusis of both ears   . Advanced care planning/counseling discussion 06/23/2015  . Skin lesion 06/23/2015  . Impaired mobility 04/23/2015  . HTN (hypertension) 03/18/2015  . Chronic diastolic heart failure, NYHA class 1 ([place]) 03/18/2015  . Spastic hemiparesis affecting dominant side ([place]) 07/07/2014  . Left shoulder pain 03/16/2014  . Chest pressure 02/03/2014  . Medicare annual wellness visit, subsequent 11/06/2013  . Erectile dysfunction due to arterial insufficiency 02/05/2013  . Depression   . Palpitations 06/26/2012  . HLD (hyperlipidemia)   . Restless leg syndrome   . GERD (gastroesophageal reflux disease)   . BPH with obstruction/lower urinary tract symptoms   . History of CVA (cerebrovascular accident) 03/11/2012  . Hemiparesis affecting right side as late effect of stroke ([place]) 03/11/2012  . Type 2 diabetes, controlled, with neuropathy ([place]) 03/11/2012  . OSA on CPAP 03/11/2012  . CKD (chronic kidney disease) stage 3, GFR 30-59 ml/min 03/11/2012  . Migraines 03/11/2012   Royce Macadamia PT, DPT, CSCS    11/09/2016, 6:24 PM  [facility] [address], [name], [address] Phone: [phone]   Fax:  [phone]  Name: James COOR, James Bell MRN: [id number] Date of Birth: 1931-09-13

## 2016-11-09 NOTE — Patient Instructions (Addendum)
TRX sit to stands (noted minimal reliance on his UEs) 3 sets x 10 repetitions (noted to be able to stand from green chair without use of UEs to complete session).   Standing hip abduction with HHA x2 for 1 set x 10 with red t-band, with less HHA x 10 with red t-band for 2nd set, for 3rd set with green t-band   Leg Press at 85# x 10, 95# x 12 for 2 sets (quite challenging, encouraged full knee extension on RLE)   ** at this point patient reports his legs are beginning to fatigue, PT opted to focus on pain modulation with soft tissue mobilization of L lateral shoulder where he was reporting pain.

## 2016-11-13 ENCOUNTER — Ambulatory Visit: Payer: Medicare Other | Admitting: Physical Therapy

## 2016-11-13 DIAGNOSIS — M6281 Muscle weakness (generalized): Secondary | ICD-10-CM

## 2016-11-13 DIAGNOSIS — M545 Low back pain, unspecified: Secondary | ICD-10-CM

## 2016-11-13 DIAGNOSIS — R269 Unspecified abnormalities of gait and mobility: Secondary | ICD-10-CM | POA: Diagnosis not present

## 2016-11-13 DIAGNOSIS — G8929 Other chronic pain: Secondary | ICD-10-CM

## 2016-11-13 DIAGNOSIS — M25512 Pain in left shoulder: Secondary | ICD-10-CM | POA: Diagnosis not present

## 2016-11-13 NOTE — Patient Instructions (Signed)
Seated rows on OMEGA at 20# x 12 for 3 sets (able to complete bilaterally with challenge)

## 2016-11-14 NOTE — Therapy (Signed)
[place] [facility] [address], [name], [address] Phone: [phone]   Fax:  [phone]  Physical Therapy Treatment  Patient Details  Name: James HORA, James Bell MRN: [id number] Date of Birth: [birth year] Referring Provider: Lajean Manes James Bell  Encounter Date: 11/13/2016      PT End of Session - 11/13/16 1347    Visit Number 10   Number of Visits 15   Date for PT Re-Evaluation 12/18/16   PT Start Time 5638   PT Stop Time 1430   PT Time Calculation (min) 47 min   Activity Tolerance Patient tolerated treatment well   Behavior During Therapy [facility] for tasks assessed/performed      Past Medical History:  Diagnosis Date  . Anemia   . Basal cell carcinoma of nose 2002   bridge of nose  . BPH with obstruction/lower urinary tract symptoms    failed flomax, uroxatral, myrbetriq - sees urology [name]) pending videourodynamics  . CKD (chronic kidney disease) stage 3, GFR 30-59 ml/min 03/11/2012   Renal US 02/2014 - multiple kidney cysts   . CVA (cerebral infarction) 03/07/12   slurred speech; right hemplegia, left handed - completed PT 07/2014 (17 sessions)  . Depression    mild, on cymbalta per prior PCP  . Erectile dysfunction    per urology (prostaglandin injection)  . GERD (gastroesophageal reflux disease)    with sliding HH  . H/O hiatal hernia   . History of kidney problems 12/2010   lacerated left kidney after fall  . HLD (hyperlipidemia)   . Kidney cysts 02/2014   bilateral (renal US)  . Migraines 03/11/12   now rare  . OSA on CPAP    sleep study 01/2014 - severe, CPAP at 10, previously on nuvigil (Dr. [name])  . Palpitations 01/2012   holter WNL, rare PAC/PVCs  . Presbycusis of both ears 2016   rec amplification [facility])  . Restless leg syndrome    well controlled on mirapex  . Rosacea    [place]owalski  . TIA (transient ischemic attack)   . Type 2 diabetes, controlled, with neuropathy [facility])  2011   [name] in [facility]    Past Surgical History:  Procedure Laterality Date  . ABI  2007   WNL  . CARDIAC CATHETERIZATION  2004   normal; [place]  . CARDIOVASCULAR STRESS TEST  02/2014   WNL, low risk scan, EF 68%  . CAROTID ENDARTERECTOMY    . CATARACT EXTRACTION W/ INTRAOCULAR LENS IMPLANT  11//2012 - 08/2011  . COLONOSCOPY  03/2011   poor prep, unable to biopsy polyp in tic fold, rpt 3 mo [name])  . COLONOSCOPY  06/2011   mult polyps adenomatous, severe diverticulosis, rpt 3 yrs [name])  . CYSTOSCOPY WITH URETHRAL DILATATION N/A 07/24/2013   Procedure: CYSTOSCOPY WITH URETHRAL DILATATION;  Surgeon: [name], James Bell;  [facility];  Service: Urology;  Laterality: N/A;  . ESOPHAGOGASTRODUODENOSCOPY  2006   duodenitis, medual HH, Grade 2 reflux, mild gastritis  . ESOPHAGOGASTRODUODENOSCOPY  07/2011   mild chronic gastritis  . [place]  . [place]; 2002   left, then repaired  . MRI brain  04/2010   WNL  . PROSTATE SURGERY  02/2009   PVP (laser)  . SKIN CANCER EXCISION  2002   bridge of nose, BCC  . TONSILLECTOMY AND ADENOIDECTOMY  ~ 1945  . TRANSURETHRAL INCISION OF PROSTATE N/A 07/24/2013   Procedure:  TRANSURETHRAL INCISION OF THE PROSTATE (TUIP);  Surgeon: [name], James Bell;  [facility];  Service: Urology;  Laterality: N/A;  . URETHRAL DILATION  2014   tannenbaum    There were no vitals filed for this visit.      Subjective Assessment - 11/13/16 1345    Subjective Patient reports he was able to walk 1/3rd of a mile this morning with no back pain and no shoulder pain. He reports his L hip did bother him but this has come and gone in the past.    Pertinent History Patient has a history of L CVA impairing his R hand and LE function.    Limitations Standing;House hold activities;Walking   How long can you walk comfortably? Less than an hour.    Diagnostic tests X-rays    Patient Stated Goals To be able to wake up  and stand for > 1 hour without pain    Currently in Pain? No/denies      Seated rows on OMEGA at 20# x 12 for 3 sets (able to complete bilaterally with challenge)  Standing hip abductions on MATRIX hip machine 25# x 12 repetitions bilaterally for 3 sets (challenging)   Seated press ups x 8 repetitions x 3 sets to activate scapular depressors (performed appropriately)   Standing incline push ups to the treadmill (able to perform appropriately with cuing for maintaining more neutral chest position) x 10 for 3 sets                            PT Education - 11/13/16 1354    Education provided Yes   Education Details Will soon decrease therapy to 1x/week given his progress.    Person(s) Educated Patient   Methods Explanation;Demonstration   Comprehension Verbalized understanding;Returned demonstration             PT Long Term Goals - 11/02/16 1623      PT LONG TERM GOAL #1   Title Patient will be able to stand for 1 hour without increase in pain to demonstrate improved tolerance for ADLs.    Baseline 20 minutes without increase in back pain.    Time 7   Period Weeks   Status Partially Met     PT LONG TERM GOAL #2   Title Patient will report modified ODI score of less than 15% to demonstrate improved tolerance for ADLs.    Baseline 26%   Time 7   Period Weeks   Status New     PT LONG TERM GOAL #3   Title Patient will complete 5x sit to stand in less than 14 seconds to demonstrate improved LE strength.    Baseline 5x sit to stand in 25.73 seconds.    Time 7   Period Weeks   Status Partially Met     PT LONG TERM GOAL #4   Title Patient will report worst pain score of less than 3/10 on VAS to demonstrate improved tolerance for ADLs.    Baseline 5/10 at baseline, on 11/02/2016 he reports he has had this level of pain once in the past week, otherwise minimal to no pain.    Time 7   Period Weeks   Status Partially Met               Plan -  11/13/16 1347    Clinical Impression Statement Patient reports he has been able to ambulate around town without shoulder pain,  subjectively he is demonstrating less upper trapezius involvement (shoulder hiking). He is reporting that at times he forgets his L shoulder bothers him at all. He is progressing well towards mobility and quality of life related measures.    Rehab Potential Good   Clinical Impairments Affecting Rehab Potential Patient has a history of CVA impairing R side, history of diabetic peripheral neuropathy.    PT Frequency 2x / week   PT Duration 4 weeks   PT Treatment/Interventions Biofeedback;Cryotherapy;Electrical Stimulation;Moist Heat;Ultrasound;Neuromuscular re-education;Balance training;Therapeutic exercise;Therapeutic activities;Manual techniques;Taping;Stair training;Gait training;DME Instruction;Patient/family education   PT Next Visit Plan Progress hip strengthening, balance and gait training. Check for directional preferences.    PT Home Exercise Plan Supine bridge, clamshells, sidelying hip abductions, supine hip flexor stretch    Consulted and Agree with Plan of Care Patient      Patient will benefit from skilled therapeutic intervention in order to improve the following deficits and impairments:  Abnormal gait, Decreased endurance, Decreased strength, Decreased balance, Decreased mobility, Difficulty walking  Visit Diagnosis: Chronic midline low back pain without sciatica  Chronic left shoulder pain  Muscle weakness  Abnormality of gait     Problem List Patient Active Problem List   Diagnosis Date Noted  . Rosacea   . Dry cough 10/25/2015  . Gait disturbance, post-stroke 10/05/2015  . Presbycusis of both ears   . Advanced care planning/counseling discussion 06/23/2015  . Skin lesion 06/23/2015  . Impaired mobility 04/23/2015  . HTN (hypertension) 03/18/2015  . Chronic diastolic heart failure, NYHA class 1 ([place]) 03/18/2015  . Spastic hemiparesis  affecting dominant side ([employer]) 07/07/2014  . Left shoulder pain 03/16/2014  . Chest pressure 02/03/2014  . Medicare annual wellness visit, subsequent 11/06/2013  . Erectile dysfunction due to arterial insufficiency 02/05/2013  . Depression   . Palpitations 06/26/2012  . HLD (hyperlipidemia)   . Restless leg syndrome   . GERD (gastroesophageal reflux disease)   . BPH with obstruction/lower urinary tract symptoms   . History of CVA (cerebrovascular accident) 03/11/2012  . Hemiparesis affecting right side as late effect of stroke ([place]) 03/11/2012  . Type 2 diabetes, controlled, with neuropathy ([place]) 03/11/2012  . OSA on CPAP 03/11/2012  . CKD (chronic kidney disease) stage 3, GFR 30-59 ml/min 03/11/2012  . Migraines 03/11/2012   Royce Macadamia PT, DPT, CSCS    11/14/2016, 9:17 AM  [place] [facility] [address] [name], [address] Phone: [phone]   Fax:  [phone]  Name: James AMRHEIN, James Bell MRN: [id number] Date of Birth: [birth year]

## 2016-11-16 ENCOUNTER — Ambulatory Visit: Payer: Medicare Other | Attending: Geriatric Medicine | Admitting: Physical Therapy

## 2016-11-16 ENCOUNTER — Other Ambulatory Visit: Payer: Self-pay | Admitting: *Deleted

## 2016-11-16 DIAGNOSIS — M6281 Muscle weakness (generalized): Secondary | ICD-10-CM

## 2016-11-16 DIAGNOSIS — G8929 Other chronic pain: Secondary | ICD-10-CM | POA: Diagnosis not present

## 2016-11-16 DIAGNOSIS — M545 Other chronic pain: Secondary | ICD-10-CM

## 2016-11-16 DIAGNOSIS — M25512 Pain in left shoulder: Secondary | ICD-10-CM | POA: Diagnosis not present

## 2016-11-16 MED ORDER — DIAZEPAM 5 MG PO TABS: 5.0000 mg | ORAL_TABLET | Freq: Three times a day (TID) | ORAL | 0 refills | Status: DC | PRN

## 2016-11-16 NOTE — Telephone Encounter (Signed)
Printed and in Kim's box 

## 2016-11-16 NOTE — Telephone Encounter (Signed)
Ok to refill? Last filled 06/29/16 #90 0RF. Will need written Rx for mail order.

## 2016-11-16 NOTE — Therapy (Signed)
[place] [facility] [address] [name], [address] Phone: [phone]   Fax:  [phone]  Physical Therapy Treatment  Patient Details  Name: James VANDEBERG, MD MRN: [id number] Date of Birth: July 31, 1931 Referring Provider: Lajean Manes MD  Encounter Date: 11/16/2016      PT End of Session - 11/16/16 1429    Visit Number 11   Number of Visits 15   Date for PT Re-Evaluation 12/18/16   PT Start Time 2774   PT Stop Time 1422   PT Time Calculation (min) 30 min   Activity Tolerance Patient tolerated treatment well   Behavior During Therapy [facility] for tasks assessed/performed      Past Medical History:  Diagnosis Date  . Anemia   . Basal cell carcinoma of nose 2002   bridge of nose  . BPH with obstruction/lower urinary tract symptoms    failed flomax, uroxatral, myrbetriq - sees urology [name]) pending videourodynamics  . CKD (chronic kidney disease) stage 3, GFR 30-59 ml/min 03/11/2012   Renal US 02/2014 - multiple kidney cysts   . CVA (cerebral infarction) 03/07/12   slurred speech; right hemplegia, left handed - completed PT 07/2014 (17 sessions)  . Depression    mild, on cymbalta per prior PCP  . Erectile dysfunction    per urology (prostaglandin injection)  . GERD (gastroesophageal reflux disease)    with sliding HH  . H/O hiatal hernia   . History of kidney problems 12/2010   lacerated left kidney after fall  . HLD (hyperlipidemia)   . Kidney cysts 02/2014   bilateral (renal US)  . Migraines 03/11/12   now rare  . OSA on CPAP    sleep study 01/2014 - severe, CPAP at 10, previously on nuvigil (Dr. [name])  . Palpitations 01/2012   holter WNL, rare PAC/PVCs  . Presbycusis of both ears 2016   rec amplification [facility])  . Restless leg syndrome    well controlled on mirapex  . Rosacea    Kowalski  . TIA (transient ischemic attack)   . Type 2 diabetes, controlled, with neuropathy [facility]) 2011    [name] [facility]    Past Surgical History:  Procedure Laterality Date  . ABI  2007   WNL  . CARDIAC CATHETERIZATION  2004   normal; [place] [facility]  . CARDIOVASCULAR STRESS TEST  02/2014   WNL, low risk scan, EF 68%  . CAROTID ENDARTERECTOMY    . CATARACT EXTRACTION W/ INTRAOCULAR LENS IMPLANT  11//2012 - 08/2011  . COLONOSCOPY  03/2011   poor prep, unable to biopsy polyp in tic fold, rpt 3 mo [name])  . COLONOSCOPY  06/2011   mult polyps adenomatous, severe diverticulosis, rpt 3 yrs [name])  . CYSTOSCOPY WITH URETHRAL DILATATION N/A 07/24/2013   Procedure: CYSTOSCOPY WITH URETHRAL DILATATION;  Surgeon: [name], MD;  Location: [facility];  Service: Urology;  Laterality: N/A;  . ESOPHAGOGASTRODUODENOSCOPY  2006   duodenitis, medual HH, Grade 2 reflux, mild gastritis  . ESOPHAGOGASTRODUODENOSCOPY  07/2011   mild chronic gastritis  . [place]  . [place]; 2002   left, then repaired  . MRI brain  04/2010   WNL  . PROSTATE SURGERY  02/2009   PVP (laser)  . SKIN CANCER EXCISION  2002   bridge of nose, BCC  . TONSILLECTOMY AND ADENOIDECTOMY  ~ 1945  . TRANSURETHRAL INCISION OF PROSTATE N/A 07/24/2013   Procedure:  TRANSURETHRAL INCISION OF THE PROSTATE (TUIP);  Surgeon: [name], MD;  Location[facility];  Service: Urology;  Laterality: N/A;  . URETHRAL DILATION  2014   tannenbaum    There were no vitals filed for this visit.      Subjective Assessment - 11/16/16 1430    Subjective Patient reports he has been walking a lot today, he has some mild discomfort in his lumbar spine, his L shoulder is still feeling pretty good.    Pertinent History Patient has a history of L CVA impairing his R hand and LE function.    Limitations Standing;House hold activities;Walking   How long can you walk comfortably? Less than an hour.    Diagnostic tests X-rays    Patient Stated Goals To be able to wake up and stand for > 1 hour without pain     Currently in Pain? No/denies  Mild soreness in his lumbar spine.        Lateral step ups to 1 riser with HHA - 2 sets x 10 bilaterally (challenging, but not overly fatiguing)   Bilateral ER with yellow t-band -- 2 sets x 15 repetitions (cuing for keeping his elbows close to his rib cage)   Assessed AROM of L shoulder, appeared WNL, with no reports of pain   Chair press ups - no pain reported, arms slightly posterior to rib cage, otherwise able to perform x 5                          PT Education - 11/16/16 1430    Education provided Yes   Education Details Given his progress will have patient follow up in 2 weeks.   Person(s) Educated Patient   Methods Explanation;Demonstration   Comprehension Verbalized understanding;Returned demonstration             PT Long Term Goals - 11/02/16 1623      PT LONG TERM GOAL #1   Title Patient will be able to stand for 1 hour without increase in pain to demonstrate improved tolerance for ADLs.    Baseline 20 minutes without increase in back pain.    Time 7   Period Weeks   Status Partially Met     PT LONG TERM GOAL #2   Title Patient will report modified ODI score of less than 15% to demonstrate improved tolerance for ADLs.    Baseline 26%   Time 7   Period Weeks   Status New     PT LONG TERM GOAL #3   Title Patient will complete 5x sit to stand in less than 14 seconds to demonstrate improved LE strength.    Baseline 5x sit to stand in 25.73 seconds.    Time 7   Period Weeks   Status Partially Met     PT LONG TERM GOAL #4   Title Patient will report worst pain score of less than 3/10 on VAS to demonstrate improved tolerance for ADLs.    Baseline 5/10 at baseline, on 11/02/2016 he reports he has had this level of pain once in the past week, otherwise minimal to no pain.    Time 7   Period Weeks   Status Partially Met               Plan - 11/16/16 1506    Clinical Impression Statement Patient  reports his L shoulder pain has largely subsided, he has no real further complaints and has  been diligent with his HEP. He was having an increase in low back pain today, consistent with gluteal fatigue, reports he did a lot of ambulation on this date. He would benefit from continued LE strengthening, which has been implemented as part of HEP.    Rehab Potential Good   Clinical Impairments Affecting Rehab Potential Patient has a history of CVA impairing R side, history of diabetic peripheral neuropathy.    PT Frequency 2x / week   PT Duration 4 weeks   PT Treatment/Interventions Biofeedback;Cryotherapy;Electrical Stimulation;Moist Heat;Ultrasound;Neuromuscular re-education;Balance training;Therapeutic exercise;Therapeutic activities;Manual techniques;Taping;Stair training;Gait training;DME Instruction;Patient/family education   PT Next Visit Plan Progress hip strengthening, balance and gait training. Check for directional preferences.    PT Home Exercise Plan Supine bridge, clamshells, sidelying hip abductions, supine hip flexor stretch    Consulted and Agree with Plan of Care Patient      Patient will benefit from skilled therapeutic intervention in order to improve the following deficits and impairments:  Abnormal gait, Decreased endurance, Decreased strength, Decreased balance, Decreased mobility, Difficulty walking  Visit Diagnosis: Chronic midline low back pain without sciatica  Chronic left shoulder pain  Muscle weakness     Problem List Patient Active Problem List   Diagnosis Date Noted  . Rosacea   . Dry cough 10/25/2015  . Gait disturbance, post-stroke 10/05/2015  . Presbycusis of both ears   . Advanced care planning/counseling discussion 06/23/2015  . Skin lesion 06/23/2015  . Impaired mobility 04/23/2015  . HTN (hypertension) 03/18/2015  . Chronic diastolic heart failure, NYHA class 1 ([place]) 03/18/2015  . Spastic hemiparesis affecting dominant side ([place]) 07/07/2014  .  Left shoulder pain 03/16/2014  . Chest pressure 02/03/2014  . Medicare annual wellness visit, subsequent 11/06/2013  . Erectile dysfunction due to arterial insufficiency 02/05/2013  . Depression   . Palpitations 06/26/2012  . HLD (hyperlipidemia)   . Restless leg syndrome   . GERD (gastroesophageal reflux disease)   . BPH with obstruction/lower urinary tract symptoms   . History of CVA (cerebrovascular accident) 03/11/2012  . Hemiparesis affecting right side as late effect of stroke ([place]) 03/11/2012  . Type 2 diabetes, controlled, with neuropathy ([place]) 03/11/2012  . OSA on CPAP 03/11/2012  . CKD (chronic kidney disease) stage 3, GFR 30-59 ml/min 03/11/2012  . Migraines 03/11/2012   Royce Macadamia PT, DPT, CSCS    11/16/2016, 3:18 PM  [place] [facility] [address] [name], [address] Phone: [phone]   Fax:  [phone]  Name: NOLAN TUAZON, MD MRN: [id number] Date of Birth: March 15, 1931

## 2016-11-16 NOTE — Telephone Encounter (Signed)
Rx faxed to mail order 

## 2016-11-20 ENCOUNTER — Ambulatory Visit: Payer: Medicare Other | Admitting: Physical Therapy

## 2016-11-23 ENCOUNTER — Encounter: Payer: Medicare Other | Admitting: Physical Therapy

## 2016-11-23 ENCOUNTER — Other Ambulatory Visit: Payer: Self-pay | Admitting: Family Medicine

## 2016-11-26 ENCOUNTER — Other Ambulatory Visit: Payer: Self-pay | Admitting: Family Medicine

## 2016-11-27 ENCOUNTER — Encounter: Payer: Medicare Other | Admitting: Physical Therapy

## 2016-11-30 ENCOUNTER — Ambulatory Visit: Payer: Medicare Other | Admitting: Physical Therapy

## 2016-11-30 DIAGNOSIS — M25512 Pain in left shoulder: Secondary | ICD-10-CM

## 2016-11-30 DIAGNOSIS — M545 Low back pain, unspecified: Secondary | ICD-10-CM

## 2016-11-30 DIAGNOSIS — M6281 Muscle weakness (generalized): Secondary | ICD-10-CM

## 2016-11-30 DIAGNOSIS — G8929 Other chronic pain: Secondary | ICD-10-CM

## 2016-12-01 NOTE — Therapy (Signed)
[facility] [address] [place], [address] Phone: [phone]   Fax:  [phone]  Physical Therapy Treatment  Patient Details  Name: James SARVIS, James Bell MRN: [id number] Date of Birth: 1931-06-12 Referring Provider: Merlene Laughter James Bell  Encounter Date: 11/30/2016    Past Medical History:  Diagnosis Date  . Anemia   . Basal cell carcinoma of nose 2002   bridge of nose  . BPH with obstruction/lower urinary tract symptoms    failed flomax, uroxatral, myrbetriq - sees urology Patsi Sears) pending videourodynamics  . CKD (chronic kidney disease) stage 3, GFR 30-59 ml/min 03/11/2012   Renal US 02/2014 - multiple kidney cysts   . CVA (cerebral infarction) 03/07/12   slurred speech; right hemplegia, left handed - completed PT 07/2014 (17 sessions)  . Depression    mild, on cymbalta per prior PCP  . Erectile dysfunction    per urology (prostaglandin injection)  . GERD (gastroesophageal reflux disease)    with sliding HH  . H/O hiatal hernia   . History of kidney problems 12/2010   lacerated left kidney after fall  . HLD (hyperlipidemia)   . Kidney cysts 02/2014   bilateral (renal US)  . Migraines 03/11/12   now rare  . OSA on CPAP    sleep study 01/2014 - severe, CPAP at 10, previously on nuvigil (Dr. Gareth Morgan)  . Palpitations 01/2012   holter WNL, rare PAC/PVCs  . Presbycusis of both ears 2016   rec amplification [facility])  . Restless leg syndrome    well controlled on mirapex  . Rosacea    Kowalski  . TIA (transient ischemic attack)   . Type 2 diabetes, controlled, with neuropathy [facility]) 2011   Dr Sharl Ma in [facility]    Past Surgical History:  Procedure Laterality Date  . ABI  2007   WNL  . CARDIAC CATHETERIZATION  2004   normal; [place] [facility]  . CARDIOVASCULAR STRESS TEST  02/2014   WNL, low risk scan, EF 68%  . CAROTID ENDARTERECTOMY    . CATARACT EXTRACTION W/ INTRAOCULAR LENS IMPLANT  11//2012 -  08/2011  . COLONOSCOPY  03/2011   poor prep, unable to biopsy polyp in tic fold, rpt 3 mo Pearlean Brownie)  . COLONOSCOPY  06/2011   mult polyps adenomatous, severe diverticulosis, rpt 3 yrs Pearlean Brownie)  . CYSTOSCOPY WITH URETHRAL DILATATION N/A 07/24/2013   Procedure: CYSTOSCOPY WITH URETHRAL DILATATION;  Surgeon: Kathi Ludwig, James Bell;  [facility];  Service: Urology;  Laterality: N/A;  . ESOPHAGOGASTRODUODENOSCOPY  2006   duodenitis, medual HH, Grade 2 reflux, mild gastritis  . ESOPHAGOGASTRODUODENOSCOPY  07/2011   mild chronic gastritis  . HEMORRHOID SURGERY  1991  . INGUINAL HERNIA REPAIR  1981; 2002   left, then repaired  . MRI brain  04/2010   WNL  . PROSTATE SURGERY  02/2009   PVP (laser)  . SKIN CANCER EXCISION  2002   bridge of nose, BCC  . TONSILLECTOMY AND ADENOIDECTOMY  ~ 1945  . TRANSURETHRAL INCISION OF PROSTATE N/A 07/24/2013   Procedure: TRANSURETHRAL INCISION OF THE PROSTATE (TUIP);  Surgeon: Kathi Ludwig, James Bell;  [facility];  Service: Urology;  Laterality: N/A;  . URETHRAL DILATION  2014   tannenbaum    There were no vitals filed for this visit.      Subjective Assessment - 12/01/16 1257    Subjective Patient reports he had pain from a long drive in his back, but was  able to self manage. He has largely been asymptomatic and able to self manage when he does have symptoms.    Pertinent History Patient has a history of L CVA impairing his R hand and LE function.    Limitations Standing;House hold activities;Walking   How long can you walk comfortably? Less than an hour.    Diagnostic tests X-rays    Patient Stated Goals To be able to wake up and stand for > 1 hour without pain    Currently in Pain? No/denies                                      PT Long Term Goals - 11/02/16 1623      PT LONG TERM GOAL #1   Title Patient will be able to stand for 1 hour without increase in pain to demonstrate improved tolerance for ADLs.     Baseline 20 minutes without increase in back pain.    Time 7   Period Weeks   Status Partially Met     PT LONG TERM GOAL #2   Title Patient will report modified ODI score of less than 15% to demonstrate improved tolerance for ADLs.    Baseline 26%   Time 7   Period Weeks   Status New     PT LONG TERM GOAL #3   Title Patient will complete 5x sit to stand in less than 14 seconds to demonstrate improved LE strength.    Baseline 5x sit to stand in 25.73 seconds.    Time 7   Period Weeks   Status Partially Met     PT LONG TERM GOAL #4   Title Patient will report worst pain score of less than 3/10 on VAS to demonstrate improved tolerance for ADLs.    Baseline 5/10 at baseline, on 11/02/2016 he reports he has had this level of pain once in the past week, otherwise minimal to no pain.    Time 7   Period Weeks   Status Partially Met               Plan - 12/01/16 1258    Clinical Impression Statement Patient is now able to self manage with HEP and reports no residual complaints. He appears appropriate for discharge, was educated to contact PT if he had any residual complaints.    Rehab Potential Good   Clinical Impairments Affecting Rehab Potential Patient has a history of CVA impairing R side, history of diabetic peripheral neuropathy.    PT Frequency 2x / week   PT Duration 4 weeks   PT Treatment/Interventions Biofeedback;Cryotherapy;Electrical Stimulation;Moist Heat;Ultrasound;Neuromuscular re-education;Balance training;Therapeutic exercise;Therapeutic activities;Manual techniques;Taping;Stair training;Gait training;DME Instruction;Patient/family education   PT Next Visit Plan Progress hip strengthening, balance and gait training. Check for directional preferences.    PT Home Exercise Plan Supine bridge, clamshells, sidelying hip abductions, supine hip flexor stretch    Consulted and Agree with Plan of Care Patient      Patient will benefit from skilled therapeutic  intervention in order to improve the following deficits and impairments:  Abnormal gait, Decreased endurance, Decreased strength, Decreased balance, Decreased mobility, Difficulty walking  Visit Diagnosis: Chronic midline low back pain without sciatica  Chronic left shoulder pain  Muscle weakness     Problem List Patient Active Problem List   Diagnosis Date Noted  . Rosacea   . Dry cough 10/25/2015  . Gait disturbance,  post-stroke 10/05/2015  . Presbycusis of both ears   . Advanced care planning/counseling discussion 06/23/2015  . Skin lesion 06/23/2015  . Impaired mobility 04/23/2015  . HTN (hypertension) 03/18/2015  . Chronic diastolic heart failure, NYHA class 1 ([place]) 03/18/2015  . Spastic hemiparesis affecting dominant side ([place]) 07/07/2014  . Left shoulder pain 03/16/2014  . Chest pressure 02/03/2014  . Medicare annual wellness visit, subsequent 11/06/2013  . Erectile dysfunction due to arterial insufficiency 02/05/2013  . Depression   . Palpitations 06/26/2012  . HLD (hyperlipidemia)   . Restless leg syndrome   . GERD (gastroesophageal reflux disease)   . BPH with obstruction/lower urinary tract symptoms   . History of CVA (cerebrovascular accident) 03/11/2012  . Hemiparesis affecting right side as late effect of stroke ([place]) 03/11/2012  . Type 2 diabetes, controlled, with neuropathy ([place]) 03/11/2012  . OSA on CPAP 03/11/2012  . CKD (chronic kidney disease) stage 3, GFR 30-59 ml/min 03/11/2012  . Migraines 03/11/2012   Royce Macadamia PT, DPT, CSCS    12/01/2016, 12:59 PM  [facility] [facility] [address], [name], [address] Phone: [phone]   Fax:  [phone]  Name: James GRANQUIST, James Bell MRN: [id number] Date of Birth: 1930/12/14

## 2016-12-04 ENCOUNTER — Encounter: Payer: Medicare Other | Admitting: Physical Therapy

## 2016-12-07 ENCOUNTER — Encounter: Payer: Medicare Other | Admitting: Physical Therapy

## 2016-12-11 ENCOUNTER — Other Ambulatory Visit: Payer: Self-pay | Admitting: Family Medicine

## 2016-12-11 ENCOUNTER — Encounter: Payer: Medicare Other | Admitting: Physical Therapy

## 2016-12-14 ENCOUNTER — Encounter: Payer: Medicare Other | Admitting: Physical Therapy

## 2016-12-18 DIAGNOSIS — L821 Other seborrheic keratosis: Secondary | ICD-10-CM | POA: Diagnosis not present

## 2016-12-18 DIAGNOSIS — L570 Actinic keratosis: Secondary | ICD-10-CM | POA: Diagnosis not present

## 2016-12-18 DIAGNOSIS — L820 Inflamed seborrheic keratosis: Secondary | ICD-10-CM | POA: Diagnosis not present

## 2016-12-18 DIAGNOSIS — L719 Rosacea, unspecified: Secondary | ICD-10-CM | POA: Diagnosis not present

## 2016-12-18 DIAGNOSIS — L578 Other skin changes due to chronic exposure to nonionizing radiation: Secondary | ICD-10-CM | POA: Diagnosis not present

## 2016-12-19 DIAGNOSIS — N3281 Overactive bladder: Secondary | ICD-10-CM | POA: Diagnosis not present

## 2016-12-19 DIAGNOSIS — R351 Nocturia: Secondary | ICD-10-CM | POA: Diagnosis not present

## 2016-12-19 DIAGNOSIS — N401 Enlarged prostate with lower urinary tract symptoms: Secondary | ICD-10-CM | POA: Diagnosis not present

## 2016-12-25 ENCOUNTER — Other Ambulatory Visit: Payer: Self-pay | Admitting: Family Medicine

## 2016-12-28 ENCOUNTER — Encounter: Payer: Self-pay | Admitting: Physical Medicine & Rehabilitation

## 2016-12-28 ENCOUNTER — Encounter: Payer: Medicare Other | Attending: Physical Medicine & Rehabilitation

## 2016-12-28 ENCOUNTER — Ambulatory Visit (HOSPITAL_BASED_OUTPATIENT_CLINIC_OR_DEPARTMENT_OTHER): Payer: Medicare Other | Admitting: Physical Medicine & Rehabilitation

## 2016-12-28 VITALS — BP 99/58 | HR 70

## 2016-12-28 DIAGNOSIS — G8110 Spastic hemiplegia affecting unspecified side: Secondary | ICD-10-CM

## 2016-12-28 DIAGNOSIS — R252 Cramp and spasm: Secondary | ICD-10-CM | POA: Insufficient documentation

## 2016-12-28 NOTE — Patient Instructions (Signed)

## 2016-12-28 NOTE — Progress Notes (Signed)
   Botox Injection for spasticity using needle EMG guidance  Dilution: 50 Units/ml Indication: Severe spasticity which interferes with ADL,mobility and/or  hygiene and is unresponsive to medication management and other conservative care Informed consent was obtained after describing risks and benefits of the procedure with the patient. This includes bleeding, bruising, infection, excessive weakness, or medication side effects. A REMS form is on file and signed. Needle: 27g 1" needle electrode Number of units per muscle Biceps75 FCR25  FDS25  Lumbrical 12 and 3, 12.5 Units each=37.5 FDP with estim to Identify DIP flexion digits 2 and 3, 37.5 units injected All injections were done after obtaining appropriate EMG activity and after negative drawback for blood. The patient tolerated the procedure well. Post procedure instructions were given. A followup appointment was made.

## 2017-01-31 ENCOUNTER — Ambulatory Visit: Payer: Medicare Other | Admitting: Family Medicine

## 2017-01-31 DIAGNOSIS — R5383 Other fatigue: Secondary | ICD-10-CM | POA: Diagnosis not present

## 2017-01-31 DIAGNOSIS — I129 Hypertensive chronic kidney disease with stage 1 through stage 4 chronic kidney disease, or unspecified chronic kidney disease: Secondary | ICD-10-CM | POA: Diagnosis not present

## 2017-01-31 DIAGNOSIS — E039 Hypothyroidism, unspecified: Secondary | ICD-10-CM | POA: Diagnosis not present

## 2017-01-31 DIAGNOSIS — Z79899 Other long term (current) drug therapy: Secondary | ICD-10-CM | POA: Diagnosis not present

## 2017-01-31 DIAGNOSIS — N400 Enlarged prostate without lower urinary tract symptoms: Secondary | ICD-10-CM | POA: Diagnosis not present

## 2017-01-31 DIAGNOSIS — N183 Chronic kidney disease, stage 3 (moderate): Secondary | ICD-10-CM | POA: Diagnosis not present

## 2017-01-31 DIAGNOSIS — E785 Hyperlipidemia, unspecified: Secondary | ICD-10-CM | POA: Diagnosis not present

## 2017-01-31 DIAGNOSIS — Z794 Long term (current) use of insulin: Secondary | ICD-10-CM | POA: Diagnosis not present

## 2017-01-31 DIAGNOSIS — F325 Major depressive disorder, single episode, in full remission: Secondary | ICD-10-CM | POA: Diagnosis not present

## 2017-01-31 DIAGNOSIS — E1142 Type 2 diabetes mellitus with diabetic polyneuropathy: Secondary | ICD-10-CM | POA: Diagnosis not present

## 2017-02-01 ENCOUNTER — Encounter: Payer: Self-pay | Admitting: Family Medicine

## 2017-02-01 ENCOUNTER — Ambulatory Visit (INDEPENDENT_AMBULATORY_CARE_PROVIDER_SITE_OTHER): Payer: Medicare Other | Admitting: Family Medicine

## 2017-02-01 VITALS — BP 108/62 | HR 61 | Temp 97.7°F | Wt 190.2 lb

## 2017-02-01 DIAGNOSIS — E785 Hyperlipidemia, unspecified: Secondary | ICD-10-CM | POA: Diagnosis not present

## 2017-02-01 DIAGNOSIS — I10 Essential (primary) hypertension: Secondary | ICD-10-CM | POA: Diagnosis not present

## 2017-02-01 DIAGNOSIS — I69351 Hemiplegia and hemiparesis following cerebral infarction affecting right dominant side: Secondary | ICD-10-CM | POA: Diagnosis not present

## 2017-02-01 DIAGNOSIS — F331 Major depressive disorder, recurrent, moderate: Secondary | ICD-10-CM

## 2017-02-01 DIAGNOSIS — G4733 Obstructive sleep apnea (adult) (pediatric): Secondary | ICD-10-CM

## 2017-02-01 DIAGNOSIS — E1140 Type 2 diabetes mellitus with diabetic neuropathy, unspecified: Secondary | ICD-10-CM

## 2017-02-01 DIAGNOSIS — G8110 Spastic hemiplegia affecting unspecified side: Secondary | ICD-10-CM

## 2017-02-01 DIAGNOSIS — Z9989 Dependence on other enabling machines and devices: Secondary | ICD-10-CM

## 2017-02-01 NOTE — Assessment & Plan Note (Signed)
Appreciate endo care. Continues close f/u with [name].

## 2017-02-01 NOTE — Assessment & Plan Note (Signed)
Chronic. Discussed recommended continued crestor. Will need FLP next labs.

## 2017-02-01 NOTE — Assessment & Plan Note (Signed)
Chronic, stable. Continue current regimen. 

## 2017-02-01 NOTE — Progress Notes (Signed)
BP 108/62 (BP Location: Left Arm, Patient Position: Sitting, Cuff Size: Normal)   Pulse 61   Temp 97.7 F (36.5 C) (Oral)   Wt 190 lb 4 oz (86.3 kg)   SpO2 97%   BMI 28.93 kg/m    CC: 6 mo f/u visit Subjective:    Patient ID: James Spurr, MD, male    DOB: 1931-05-23, 83 y.o.   MRN: [id number]  HPI: HARVIE MORUA, MD is a 83 y.o. male presenting on 02/01/2017 for Follow-up (6 month f/u)   Saw Lesia for medicare wellness visit 07/17/2016.  Continues seeing PM&R for R spastic hemiparesis after CVA. Completed [facility] rehab and balance training.   Saw [name] yesterday for diabetes control - stable period. On tresiba 80u nightly.  Will try cymbalta 30mg  dose next fill - possible fatigue from higher dose.  Had labs with [name].   Relevant past medical, surgical, family and social history reviewed and updated as indicated. Interim medical history since our last visit reviewed. Allergies and medications reviewed and updated. Outpatient Medications Prior to Visit  Medication Sig Dispense Refill  . clopidogrel (PLAVIX) 75 MG tablet TAKE 1 TABLET DAILY 90 tablet 1  . Coenzyme Q10 (COQ10) 150 MG CAPS Take 1 capsule by mouth 2 (two) times daily.    . diazepam (VALIUM) 5 MG tablet Take 1 tablet (5 mg total) by mouth every 8 (eight) hours as needed for muscle spasms. 90 tablet 0  . DULoxetine (CYMBALTA) 60 MG capsule Take 1 capsule (60 mg total) by mouth daily. 90 capsule 1  . FOLBEE 2.5-25-1 MG TABS tablet TAKE 1 TABLET DAILY 90 tablet 1  . gabapentin (NEURONTIN) 300 MG capsule Take 300 mg by mouth at bedtime.     Marland Kitchen ibuprofen (ADVIL,MOTRIN) 200 MG tablet Take 400 mg by mouth every 6 (six) hours as needed for mild pain or moderate pain.    . Insulin Degludec (TRESIBA FLEXTOUCH) 200 UNIT/ML SOPN Inject 80 Units into the skin at bedtime.     . Insulin Pen Needle (NOVOFINE) 30G X 8 MM MISC Use to inject insulin once daily dx:E11.40 300 each 3  . levothyroxine (LEVOTHROID) 25 MCG tablet  Take 1 tablet (25 mcg total) by mouth daily before breakfast. Per endo [name]    . losartan (COZAAR) 25 MG tablet Take 1 tablet (25 mg total) by mouth daily. 90 tablet 3  . metoprolol tartrate (LOPRESSOR) 25 MG tablet TAKE ONE-HALF (1/2) TABLET TWICE A DAY 90 tablet 1  . nitroGLYCERIN (NITROSTAT) 0.4 MG SL tablet DISSOLVE 1 TABLET UNDER THE TONGUE EVERY 5 MINUTES AS NEEDED FOR CHEST PAIN 25 tablet 1  . omega-3 acid ethyl esters (LOVAZA) 1 g capsule Take 2 capsules (2 g total) by mouth 2 (two) times daily. 360 capsule 1  . omeprazole (PRILOSEC) 40 MG capsule TAKE 1 CAPSULE DAILY 90 capsule 1  . pramipexole (MIRAPEX) 1 MG tablet TAKE 2 TABLETS DAILY AT 3 P.M. 180 tablet 1  . rosuvastatin (CRESTOR) 40 MG tablet TAKE 1 TABLET DAILY 90 tablet 1  . traZODone (DESYREL) 50 MG tablet Take 50 mg by mouth at bedtime as needed for sleep.    . vitamin C (ASCORBIC ACID) 500 MG tablet Take 1,000 mg by mouth daily.      No facility-administered medications prior to visit.      Per HPI unless specifically indicated in ROS section below Review of Systems     Objective:    BP 108/62 (BP  Location: Left Arm, Patient Position: Sitting, Cuff Size: Normal)   Pulse 61   Temp 97.7 F (36.5 C) (Oral)   Wt 190 lb 4 oz (86.3 kg)   SpO2 97%   BMI 28.93 kg/m   Wt Readings from Last 3 Encounters:  02/01/17 190 lb 4 oz (86.3 kg)  10/25/16 190 lb (86.2 kg)  07/28/16 194 lb (88 kg)    Physical Exam  Constitutional: He appears well-developed and well-nourished. No distress.  HENT:  Mouth/Throat: Oropharynx is clear and moist. No oropharyngeal exudate.  Eyes: Conjunctivae and EOM are normal. Pupils are equal, round, and reactive to light.  Neck: Normal range of motion. Neck supple. Carotid bruit is not present.  Cardiovascular: Normal rate, regular rhythm, normal heart sounds and intact distal pulses.   No murmur heard. Pulmonary/Chest: Effort normal and breath sounds normal. No respiratory distress. He has no  wheezes. He has no rales.  Musculoskeletal: He exhibits edema (mild tr on right).  Neurological:  Partial R hemiparesis R foot drop brace in place  Skin: Skin is warm and dry. No rash noted.  Psychiatric: He has a normal mood and affect.  Nursing note and vitals reviewed.  Results for orders placed or performed in visit on 07/17/16  Renal function panel  Result Value Ref Range   Sodium 138 135 - 145 mEq/L   Potassium 3.7 3.5 - 5.1 mEq/L   Chloride 103 96 - 112 mEq/L   CO2 27 19 - 32 mEq/L   Calcium 9.2 8.4 - 10.5 mg/dL   Albumin 3.8 3.5 - 5.2 g/dL   BUN 26 (H) 6 - 23 mg/dL   Creatinine, Ser 1.31 0.40 - 1.50 mg/dL   Glucose, Bld 106 (H) 70 - 99 mg/dL   Phosphorus 4.4 2.3 - 4.6 mg/dL   GFR 55.53 (L) >60.00 mL/min  CBC with Differential/Platelet  Result Value Ref Range   WBC 9.5 4.0 - 10.5 K/uL   RBC 4.43 4.22 - 5.81 Mil/uL   Hemoglobin 12.8 (L) 13.0 - 17.0 g/dL   HCT 37.5 (L) 39.0 - 52.0 %   MCV 84.6 78.0 - 100.0 fl   MCHC 34.3 30.0 - 36.0 g/dL   RDW 14.1 11.5 - 15.5 %   Platelets 198.0 150.0 - 400.0 K/uL   Neutrophils Relative % 67.3 43.0 - 77.0 %   Lymphocytes Relative 22.2 12.0 - 46.0 %   Monocytes Relative 8.2 3.0 - 12.0 %   Eosinophils Relative 1.9 0.0 - 5.0 %   Basophils Relative 0.4 0.0 - 3.0 %   Neutro Abs 6.4 1.4 - 7.7 K/uL   Lymphs Abs 2.1 0.7 - 4.0 K/uL   Monocytes Absolute 0.8 0.1 - 1.0 K/uL   Eosinophils Absolute 0.2 0.0 - 0.7 K/uL   Basophils Absolute 0.0 0.0 - 0.1 K/uL  Hemoglobin A1c  Result Value Ref Range   Hgb A1c MFr Bld 7.7 (H) 4.6 - 6.5 %   Lab Results  Component Value Date   CHOL 121 03/10/2016   HDL 17.80 (L) 03/10/2016   LDLCALC 76 03/10/2016   LDLDIRECT 64.0 06/23/2015   TRIG 136.0 03/10/2016   CHOLHDL 7 03/10/2016       Assessment & Plan:   Problem List Items Addressed This Visit    Hemiparesis affecting right side as late effect of stroke ([place])    Residual deficit. Continue plavix and statin.       HLD (hyperlipidemia)     Chronic. Discussed recommended continued crestor. Will need FLP next  labs.       HTN (hypertension)    Chronic, stable. Continue current regimen.       MDD (major depressive disorder), recurrent episode, moderate ([place]) - Primary    Stable period - planning on trying lower cymbalta dose 30mg  daily.       OSA on CPAP    OSA followed by Dr Rexene Alberts neurology.      Spastic hemiparesis affecting dominant side (H[facility])    Followed by PM&R with botox injections.      Type 2 diabetes, controlled, with neuropathy ([place])    Appreciate endo care. Continues close f/u with [name].          Follow up plan: Return in about 6 months (around 08/04/2017) for medicare wellness visit, follow up visit.  [name], MD

## 2017-02-01 NOTE — Assessment & Plan Note (Signed)
OSA followed by Dr Rexene Alberts neurology.

## 2017-02-01 NOTE — Assessment & Plan Note (Signed)
Followed by PM&R with botox injections.

## 2017-02-01 NOTE — Patient Instructions (Signed)
You are doing well today.  Return as needed or in 6 months for medicare wellness visit with Katha Cabal and then f/u with me.

## 2017-02-01 NOTE — Assessment & Plan Note (Signed)
Residual deficit. Continue plavix and statin.

## 2017-02-01 NOTE — Assessment & Plan Note (Signed)
Stable period - planning on trying lower cymbalta dose 30mg  daily.

## 2017-02-08 ENCOUNTER — Ambulatory Visit: Payer: Medicare Other | Admitting: Physical Medicine & Rehabilitation

## 2017-02-19 ENCOUNTER — Encounter: Payer: Self-pay | Admitting: Physical Medicine & Rehabilitation

## 2017-02-19 ENCOUNTER — Ambulatory Visit (HOSPITAL_BASED_OUTPATIENT_CLINIC_OR_DEPARTMENT_OTHER): Payer: Medicare Other | Admitting: Physical Medicine & Rehabilitation

## 2017-02-19 ENCOUNTER — Encounter: Payer: Medicare Other | Attending: Physical Medicine & Rehabilitation

## 2017-02-19 VITALS — BP 145/62 | HR 61

## 2017-02-19 DIAGNOSIS — W19XXXA Unspecified fall, initial encounter: Secondary | ICD-10-CM

## 2017-02-19 DIAGNOSIS — I69398 Other sequelae of cerebral infarction: Secondary | ICD-10-CM | POA: Diagnosis not present

## 2017-02-19 DIAGNOSIS — G8110 Spastic hemiplegia affecting unspecified side: Secondary | ICD-10-CM

## 2017-02-19 DIAGNOSIS — R252 Cramp and spasm: Secondary | ICD-10-CM | POA: Diagnosis not present

## 2017-02-19 DIAGNOSIS — R269 Unspecified abnormalities of gait and mobility: Secondary | ICD-10-CM

## 2017-02-19 NOTE — Patient Instructions (Signed)
Please have someone check on you every 6 hours and if there is a decline in your thinking or level of alertness or ability to walk, you should go to ED

## 2017-02-19 NOTE — Progress Notes (Signed)
Subjective:    Patient ID: James Spurr, MD, male    DOB: Dec 26, 1930, 83 y.o.   MRN: [id number]  HPI Patient had a recent fall, headache Last night at 7pm.  Transitioning form one chair to another, hit back of head, left hip and left shoulder.  Areas are sore, not sure if he lost consciousness, no other recent falls.   Did not lock WC brakes Did not take valium yestrday  Caregiver states his right arm color looks different Takes gabapentin 300mg  qhs Not taking trazodone at night No ETOH yesterday  No new weakness, no new numbness Slight double vision for several months  Pain Inventory Average Pain 0 Pain Right Now 3 My pain is constant and dull  In the last 24 hours, has pain interfered with the following? General activity 2 Relation with others 0 Enjoyment of life 0 What TIME of day is your pain at its worst? no selection Sleep (in general) Fair  Pain is worse with: unsure Pain improves with: other Relief from Meds: no selection  Mobility walk with assistance use a cane use a walker use a wheelchair Do you have any goals in this area?  no  Function retired  Neuro/Psych trouble walking  Prior Studies Any changes since last visit?  no  Physicians involved in your care Any changes since last visit?  no   Family History  Problem Relation Age of Onset  . Stroke Father   . Lung cancer Father 70        smoker  . Stroke Brother   . Diabetes Brother 60  . Lung cancer Brother   . Alcoholism Sister   . Kidney disease Sister   . Coronary artery disease Neg Hx    Social History   Social History  . Marital status: Unknown    Spouse name: N/A  . Number of children: 2  . Years of education: MD   Occupational History  . retired   .  Retired   Social History Main Topics  . Smoking status: Never Smoker  . Smokeless tobacco: Never Used     Comment: "used to smoke pipe when I was younger"  . Alcohol use 2.4 oz/week    2 Glasses of wine, 2 Shots  of liquor per week     Comment: occasional  . Drug use: No  . Sexual activity: Yes   Other Topics Concern  . None   Social History Narrative   Caffeine: 1 cup coffee/day    Occupation: ophthalmologist, retired   Edu: MD   Activity: going to gym 3x/wk with trainer   Diet: some water, fruits/vegetables some, red meat a few times, fish 1x/wk   POA - [name] (Daughter)   Past Surgical History:  Procedure Laterality Date  . ABI  2007   WNL  . CARDIAC CATHETERIZATION  2004   normal; [facility]  . CARDIOVASCULAR STRESS TEST  02/2014   WNL, low risk scan, EF 68%  . CAROTID ENDARTERECTOMY    . CATARACT EXTRACTION W/ INTRAOCULAR LENS IMPLANT  11//2012 - 08/2011  . COLONOSCOPY  03/2011   poor prep, unable to biopsy polyp in tic fold, rpt 3 mo [name])  . COLONOSCOPY  06/2011   mult polyps adenomatous, severe diverticulosis, rpt 3 yrs [name])  . CYSTOSCOPY WITH URETHRAL DILATATION N/A 07/24/2013   Procedure: CYSTOSCOPY WITH URETHRAL DILATATION;  Surgeon: [name], MD;  Location: [facility];  Service: Urology;  Laterality: N/A;  . ESOPHAGOGASTRODUODENOSCOPY  2006   duodenitis, medual HH, Grade 2 reflux, mild gastritis  . ESOPHAGOGASTRODUODENOSCOPY  07/2011   mild chronic gastritis  . [place]  . [place]; 2002   left, then repaired  . MRI brain  04/2010   WNL  . PROSTATE SURGERY  02/2009   PVP (laser)  . SKIN CANCER EXCISION  2002   bridge of nose, BCC  . TONSILLECTOMY AND ADENOIDECTOMY  ~ 1945  . TRANSURETHRAL INCISION OF PROSTATE N/A 07/24/2013   Procedure: TRANSURETHRAL INCISION OF THE PROSTATE (TUIP);  Surgeon: [name], MD;  Location: [facility];  Service: Urology;  Laterality: N/A;  . URETHRAL DILATION  2014   tannenbaum   Past Medical History:  Diagnosis Date  . Anemia   . Basal cell carcinoma of nose 2002   bridge of nose  . BPH with obstruction/lower urinary tract symptoms    failed flomax, uroxatral, myrbetriq  - sees urology [name]) pending videourodynamics  . CKD (chronic kidney disease) stage 3, GFR 30-59 ml/min 03/11/2012   Renal US 02/2014 - multiple kidney cysts   . CVA (cerebral infarction) 03/07/12   slurred speech; right hemplegia, left handed - completed PT 07/2014 (17 sessions)  . Depression    mild, on cymbalta per prior PCP  . Erectile dysfunction    per urology (prostaglandin injection)  . GERD (gastroesophageal reflux disease)    with sliding HH  . H/O hiatal hernia   . History of kidney problems 12/2010   lacerated left kidney after fall  . HLD (hyperlipidemia)   . Kidney cysts 02/2014   bilateral (renal US)  . Migraines 03/11/12   now rare  . OSA on CPAP    sleep study 01/2014 - severe, CPAP at 10, previously on nuvigil (Dr. [name])  . Palpitations 01/2012   holter WNL, rare PAC/PVCs  . Presbycusis of both ears 2016   rec amplification [facility])  . Restless leg syndrome    well controlled on mirapex  . Rosacea    Kowalski  . TIA (transient ischemic attack)   . Type 2 diabetes, controlled, with neuropathy ([place]) 2011   [name] in [place]   BP (!) 145/62 (BP Location: Left Arm, Patient Position: Sitting, Cuff Size: Normal)   Pulse 61   SpO2 98%   Opioid Risk Score:   Fall Risk Score:  `1  Depression screen PHQ 2/9  Depression screen [facility] 2/9 07/17/2016 01/17/2016 10/05/2015 06/23/2015 04/08/2015 11/06/2013  Decreased Interest 0 0 0 0 0 1  Down, Depressed, Hopeless 0 0 0 0 0 1  PHQ - 2 Score 0 0 0 0 0 2  Altered sleeping - - - - - 3  Tired, decreased energy - - - - - 1  Change in appetite - - - - - 1  Feeling bad or failure about yourself  - - - - - 0  Trouble concentrating - - - - - 1  Moving slowly or fidgety/restless - - - - - 0  Suicidal thoughts - - - - - 0  PHQ-9 Score - - - - - 8  Some recent data might be hidden    Review of Systems  Constitutional: Negative.   HENT: Negative.   Eyes: Negative.   Respiratory: Negative.   Cardiovascular: Negative.     Gastrointestinal: Negative.   Endocrine: Negative.   Genitourinary: Negative.   Musculoskeletal: Positive for gait problem.  Skin: Negative.   Allergic/Immunologic: Negative.   Neurological:  Positive for headaches.  Hematological: Negative.   Psychiatric/Behavioral: Negative.   All other systems reviewed and are negative.      Objective:   Physical Exam  Constitutional: He is oriented to person, place, and time. He appears well-developed and well-nourished. No distress.  HENT:  Head: Normocephalic and atraumatic.  Eyes: Conjunctivae and EOM are normal. Pupils are equal, round, and reactive to light.  Neck: Normal range of motion. Neck supple.  Cardiovascular: Normal rate, regular rhythm, normal heart sounds and intact distal pulses.   No murmur heard. Pulmonary/Chest: Effort normal and breath sounds normal. No stridor. No respiratory distress. He has no wheezes.  Abdominal: Soft. Bowel sounds are normal. He exhibits no distension. There is no tenderness.  Neurological: He is alert and oriented to person, place, and time.  Modified Ashworth score of 1 at the right biceps, finger flexors, wrist flexors. Motor strength is 3 minus at the right deltoid, biceps, triceps, finger flexors, extensors, 4 minus at the right hip flexor, knee extensor and 3 at the ankle dorsiflexor 5/5 in the left upper and left lower limb. Sensation is equal bilateral upper and lower limbs. Ambulates with a hemiwalker. No evidence of toe drag or knee instability. He has a short step length and widened base of support. Neuro:  Eyes without evidence of nystagmus  Tone is normal without evidence of spasticity Cerebellar exam shows no evidence of ataxia on finger nose finger or heel to shin testing No evidence of trunkal ataxia     Sensory exam is normal to pinprick, proprioception and light touch in the upper and lower limbs   Cranial nerves II- Visual fields are intact to confrontation testing, no  blurring of vision III- no evidence of ptosis, upward, downward and medial gaze intact IV- no vertical diplopia or head tilt V- no facial numbness or masseter weakness VI- no pupil abduction weakness VII- no facial droop, good lid closure VII- normal auditory acuity IX- no pharygeal weakness, gag nl X- no pharyngeal weakness, no hoarseness XI- no trap or SCM weakness XII- no glossal weakness   Skin: He is not diaphoretic.  Psychiatric: He has a normal mood and affect.  Nursing note and vitals reviewed.         Assessment & Plan:  1. History of left subcortical infarct with right hemiparesis, he has responded well to Botox injection for right spastic hemiplegia. Would recommend continuing current dosing and repeat in 6 weeks  2. Fall. No evidence of neurologic decline. This still was not preceded by syncopal episode. We discussed that we can consider CT scan, although the yield is likely to be low given his lack of clinical change. At this point, I think it safe to monitor him with checkups every 6 hours for the next 48 hours. This is what he would prefer. If he has decline in his cognition or mobility. He should go to the emergency department.

## 2017-03-08 DIAGNOSIS — E119 Type 2 diabetes mellitus without complications: Secondary | ICD-10-CM | POA: Diagnosis not present

## 2017-03-08 DIAGNOSIS — H40001 Preglaucoma, unspecified, right eye: Secondary | ICD-10-CM | POA: Diagnosis not present

## 2017-03-08 LAB — HM DIABETES EYE EXAM

## 2017-03-13 ENCOUNTER — Encounter: Payer: Self-pay | Admitting: Family Medicine

## 2017-03-26 ENCOUNTER — Other Ambulatory Visit: Payer: Self-pay | Admitting: Family Medicine

## 2017-03-26 NOTE — Telephone Encounter (Signed)
Received refill electronically Last refill 06/28/16 #180/1 Last office visit 02/01/17

## 2017-03-27 ENCOUNTER — Other Ambulatory Visit: Payer: Self-pay | Admitting: *Deleted

## 2017-03-27 MED ORDER — DIAZEPAM 5 MG PO TABS: 5.0000 mg | ORAL_TABLET | Freq: Three times a day (TID) | ORAL | 0 refills | Status: DC | PRN

## 2017-03-27 NOTE — Telephone Encounter (Signed)
Printed and in [place] box to fax

## 2017-03-27 NOTE — Telephone Encounter (Signed)
Last Rx 11/2016. Last OV 01/2017

## 2017-03-27 NOTE — Telephone Encounter (Signed)
Faxed to [facility] 7/10 @ 4:20pm

## 2017-04-02 ENCOUNTER — Encounter: Payer: Medicare Other | Attending: Physical Medicine & Rehabilitation

## 2017-04-02 ENCOUNTER — Ambulatory Visit (HOSPITAL_BASED_OUTPATIENT_CLINIC_OR_DEPARTMENT_OTHER): Payer: Medicare Other | Admitting: Physical Medicine & Rehabilitation

## 2017-04-02 ENCOUNTER — Encounter: Payer: Self-pay | Admitting: Physical Medicine & Rehabilitation

## 2017-04-02 VITALS — BP 125/67 | HR 64 | Resp 14

## 2017-04-02 DIAGNOSIS — G8110 Spastic hemiplegia affecting unspecified side: Secondary | ICD-10-CM

## 2017-04-02 DIAGNOSIS — R252 Cramp and spasm: Secondary | ICD-10-CM | POA: Diagnosis not present

## 2017-04-02 NOTE — Progress Notes (Signed)
   Botox Injection for spasticity using needle EMG guidance  Dilution: 50 Units/ml Indication: Severe spasticity which interferes with ADL,mobility and/or  hygiene and is unresponsive to medication management and other conservative care Informed consent was obtained after describing risks and benefits of the procedure with the patient. This includes bleeding, bruising, infection, excessive weakness, or medication side effects. A REMS form is on file and signed. Needle: 27g 1" needle electrode Number of units per muscle [id number] FCR25  FDS25  Lumbrical 12 and 3, 12.5 Units each=37.5 FDP with estim to Identify DIP flexion digits 2 and 3, 37.5 units injected All injections were done after obtaining appropriate EMG activity and after negative drawback for blood. The patient tolerated the procedure well. Post procedure instructions were given. A followup appointment was made.   Minimal EMG activity in the lumbricals may consider no injection in 3 months.  May increase the biceps, to 100 units  Follow-up in 6 weeks

## 2017-04-02 NOTE — Patient Instructions (Signed)

## 2017-05-14 ENCOUNTER — Ambulatory Visit (HOSPITAL_BASED_OUTPATIENT_CLINIC_OR_DEPARTMENT_OTHER): Payer: Medicare Other | Admitting: Physical Medicine & Rehabilitation

## 2017-05-14 ENCOUNTER — Encounter: Payer: Self-pay | Admitting: Physical Medicine & Rehabilitation

## 2017-05-14 ENCOUNTER — Encounter: Payer: Medicare Other | Attending: Physical Medicine & Rehabilitation

## 2017-05-14 VITALS — BP 111/56 | HR 69

## 2017-05-14 DIAGNOSIS — R252 Cramp and spasm: Secondary | ICD-10-CM | POA: Diagnosis not present

## 2017-05-14 DIAGNOSIS — G8110 Spastic hemiplegia affecting unspecified side: Secondary | ICD-10-CM

## 2017-05-14 MED ORDER — DICLOFENAC SODIUM 1 % TD GEL: 2.0000 g | Freq: Four times a day (QID) | TRANSDERMAL | 1 refills | Status: DC

## 2017-05-14 NOTE — Progress Notes (Signed)
Subjective:    Patient ID: James Spurr, MD, male    DOB: 01-24-1931, 83 y.o.   MRN: [id number] Biceps75 FCR25  FDS25  Lumbrical 12 and 3, 12.5 Units each=37.5 FDP with estim to Identify DIP flexion digits 2 and 3, 37.5 units injected HPI Patient had good results from Botox injection performed approximate 6 weeks ago, at first could not perform a pinch grip between the thumb and index finger on the right hand, but this has improved. Has some bruising at the volar aspect of the right wrist, not sure when this started. We reviewed injection, muscles, and there were none  in that area. Pain Inventory Average Pain 7 Pain Right Now 7 My pain is intermittent and aching  In the last 24 hours, has pain interfered with the following? General activity 0 Relation with others 0 Enjoyment of life 0 What TIME of day is your pain at its worst? while using cane Sleep (in general) Poor  Pain is worse with: some activites Pain improves with: rest Relief from Meds: .  Mobility walk without assistance use a cane ability to climb steps?  yes do you drive?  yes  Function retired  Neuro/Psych weakness tingling trouble walking dizziness  Prior Studies Any changes since last visit?  no  Physicians involved in your care Any changes since last visit?  no   Family History  Problem Relation Age of Onset  . Stroke Father   . Lung cancer Father 34        smoker  . Stroke Brother   . Diabetes Brother 44  . Lung cancer Brother   . Alcoholism Sister   . Kidney disease Sister   . Coronary artery disease Neg Hx    Social History   Social History  . Marital status: Unknown    Spouse name: N/A  . Number of children: 2  . Years of education: MD   Occupational History  . retired   .  Retired   Social History Main Topics  . Smoking status: Never Smoker  . Smokeless tobacco: Never Used     Comment: "used to smoke pipe when I was younger"  . Alcohol use 2.4 oz/week    2  Glasses of wine, 2 Shots of liquor per week     Comment: occasional  . Drug use: No  . Sexual activity: Yes   Other Topics Concern  . Not on file   Social History Narrative   Caffeine: 1 cup coffee/day    Occupation: ophthalmologist, retired   Edu: MD   Activity: going to gym 3x/wk with trainer   Diet: some water, fruits/vegetables some, red meat a few times, fish 1x/wk   POA - [name] (Daughter)   Past Surgical History:  Procedure Laterality Date  . ABI  2007   WNL  . CARDIAC CATHETERIZATION  2004   normal; [facility]  . CARDIOVASCULAR STRESS TEST  02/2014   WNL, low risk scan, EF 68%  . CAROTID ENDARTERECTOMY    . CATARACT EXTRACTION W/ INTRAOCULAR LENS IMPLANT  11//2012 - 08/2011  . COLONOSCOPY  03/2011   poor prep, unable to biopsy polyp in tic fold, rpt 3 mo [name])  . COLONOSCOPY  06/2011   mult polyps adenomatous, severe diverticulosis, rpt 3 yrs [name])  . CYSTOSCOPY WITH URETHRAL DILATATION N/A 07/24/2013   Procedure: CYSTOSCOPY WITH URETHRAL DILATATION;  Surgeon: [name], MD;  Location: [facility];  Service: Urology;  Laterality: N/A;  . ESOPHAGOGASTRODUODENOSCOPY  2006   duodenitis, medual HH, Grade 2 reflux, mild gastritis  . ESOPHAGOGASTRODUODENOSCOPY  07/2011   mild chronic gastritis  . [place]  . [place]; 2002   left, then repaired  . MRI brain  04/2010   WNL  . PROSTATE SURGERY  02/2009   PVP (laser)  . SKIN CANCER EXCISION  2002   bridge of nose, BCC  . TONSILLECTOMY AND ADENOIDECTOMY  ~ 1945  . TRANSURETHRAL INCISION OF PROSTATE N/A 07/24/2013   Procedure: TRANSURETHRAL INCISION OF THE PROSTATE (TUIP);  Surgeon: [name], MD;  Location: [facility];  Service: Urology;  Laterality: N/A;  . URETHRAL DILATION  2014   tannenbaum   Past Medical History:  Diagnosis Date  . Anemia   . Basal cell carcinoma of nose 2002   bridge of nose  . BPH with obstruction/lower urinary tract symptoms     failed flomax, uroxatral, myrbetriq - sees urology [name]) pending videourodynamics  . CKD (chronic kidney disease) stage 3, GFR 30-59 ml/min 03/11/2012   Renal US 02/2014 - multiple kidney cysts   . CVA (cerebral infarction) 03/07/12   slurred speech; right hemplegia, left handed - completed PT 07/2014 (17 sessions)  . Depression    mild, on cymbalta per prior PCP  . Erectile dysfunction    per urology (prostaglandin injection)  . GERD (gastroesophageal reflux disease)    with sliding HH  . H/O hiatal hernia   . History of kidney problems 12/2010   lacerated left kidney after fall  . HLD (hyperlipidemia)   . Kidney cysts 02/2014   bilateral (renal US)  . Migraines 03/11/12   now rare  . OSA on CPAP    sleep study 01/2014 - severe, CPAP at 10, previously on nuvigil (Dr. [name])  . Palpitations 01/2012   holter WNL, rare PAC/PVCs  . Presbycusis of both ears 2016   rec amplification [facility])  . Restless leg syndrome    well controlled on mirapex  . Rosacea    Kowalski  . TIA (transient ischemic attack)   . Type 2 diabetes, controlled, with neuropathy [facility]) 2011   [name] in [place]   There were no vitals taken for this visit.  Opioid Risk Score:   Fall Risk Score:  `1  Depression screen PHQ 2/9  Depression screen [facility] 2/9 07/17/2016 01/17/2016 10/05/2015 06/23/2015 04/08/2015 11/06/2013  Decreased Interest 0 0 0 0 0 1  Down, Depressed, Hopeless 0 0 0 0 0 1  PHQ - 2 Score 0 0 0 0 0 2  Altered sleeping - - - - - 3  Tired, decreased energy - - - - - 1  Change in appetite - - - - - 1  Feeling bad or failure about yourself  - - - - - 0  Trouble concentrating - - - - - 1  Moving slowly or fidgety/restless - - - - - 0  Suicidal thoughts - - - - - 0  PHQ-9 Score - - - - - 8  Some recent data might be hidden     Review of Systems  Constitutional: Negative.   HENT: Negative.   Eyes: Negative.   Respiratory: Negative.   Cardiovascular: Negative.   Gastrointestinal: Negative.    Endocrine: Negative.   Genitourinary: Negative.   Musculoskeletal: Negative.   Skin: Negative.   Allergic/Immunologic: Negative.   Neurological: Negative.   Hematological: Negative.   Psychiatric/Behavioral: Negative.   All other systems reviewed and  are negative.      Objective:   Physical Exam  Constitutional: He is oriented to person, place, and time. He appears well-developed and well-nourished.  HENT:  Head: Normocephalic and atraumatic.  Eyes: Pupils are equal, round, and reactive to light. Conjunctivae and EOM are normal.  Neck: Normal range of motion.  Neurological: He is alert and oriented to person, place, and time.  Psychiatric: He has a normal mood and affect.  Nursing note and vitals reviewed.  Swan-neck deformity. Right third finger. Lumbricals, MAS 1 biceps, MAS 0, finger flexors and wrist flexors MAS 0 3 minus strength of the right deltoid, biceps, triceps, finger flexors and extensors       Assessment & Plan:  1. Right spastic hemiplegia secondary to left subcortical infarct. Excellent reduction in excessive finger flexor, elbow flexor and wrist flexor tone with Botox injection, plan to repeat in 6 weeks  No change in dosing or muscle groups selection

## 2017-05-14 NOTE — Patient Instructions (Signed)
Please call OT to get ring brace for Right 3rd finger, to correct swan neck deformity

## 2017-05-15 ENCOUNTER — Ambulatory Visit: Payer: Medicare Other | Admitting: Physical Medicine & Rehabilitation

## 2017-05-15 ENCOUNTER — Ambulatory Visit: Payer: Medicare Other

## 2017-05-22 ENCOUNTER — Other Ambulatory Visit: Payer: Self-pay | Admitting: Family Medicine

## 2017-05-26 ENCOUNTER — Other Ambulatory Visit: Payer: Self-pay | Admitting: Family Medicine

## 2017-06-09 ENCOUNTER — Other Ambulatory Visit: Payer: Self-pay | Admitting: Family Medicine

## 2017-06-20 DIAGNOSIS — D485 Neoplasm of uncertain behavior of skin: Secondary | ICD-10-CM | POA: Diagnosis not present

## 2017-06-20 DIAGNOSIS — Z85828 Personal history of other malignant neoplasm of skin: Secondary | ICD-10-CM | POA: Diagnosis not present

## 2017-06-20 DIAGNOSIS — L578 Other skin changes due to chronic exposure to nonionizing radiation: Secondary | ICD-10-CM | POA: Diagnosis not present

## 2017-06-20 DIAGNOSIS — L719 Rosacea, unspecified: Secondary | ICD-10-CM | POA: Diagnosis not present

## 2017-06-20 DIAGNOSIS — L821 Other seborrheic keratosis: Secondary | ICD-10-CM | POA: Diagnosis not present

## 2017-06-20 DIAGNOSIS — C44319 Basal cell carcinoma of skin of other parts of face: Secondary | ICD-10-CM | POA: Diagnosis not present

## 2017-06-23 ENCOUNTER — Other Ambulatory Visit: Payer: Self-pay | Admitting: Family Medicine

## 2017-07-02 ENCOUNTER — Ambulatory Visit: Payer: Medicare Other | Admitting: Physical Medicine & Rehabilitation

## 2017-07-10 DIAGNOSIS — C44319 Basal cell carcinoma of skin of other parts of face: Secondary | ICD-10-CM | POA: Diagnosis not present

## 2017-07-10 DIAGNOSIS — Z85828 Personal history of other malignant neoplasm of skin: Secondary | ICD-10-CM | POA: Insufficient documentation

## 2017-07-10 HISTORY — DX: Personal history of other malignant neoplasm of skin: Z85.828

## 2017-07-12 ENCOUNTER — Other Ambulatory Visit: Payer: Self-pay | Admitting: Family Medicine

## 2017-07-12 DIAGNOSIS — E785 Hyperlipidemia, unspecified: Secondary | ICD-10-CM

## 2017-07-12 DIAGNOSIS — I10 Essential (primary) hypertension: Secondary | ICD-10-CM

## 2017-07-12 DIAGNOSIS — N183 Chronic kidney disease, stage 3 unspecified: Secondary | ICD-10-CM

## 2017-07-12 DIAGNOSIS — E1140 Type 2 diabetes mellitus with diabetic neuropathy, unspecified: Secondary | ICD-10-CM

## 2017-07-13 ENCOUNTER — Other Ambulatory Visit (INDEPENDENT_AMBULATORY_CARE_PROVIDER_SITE_OTHER): Payer: Medicare Other

## 2017-07-13 DIAGNOSIS — N183 Chronic kidney disease, stage 3 unspecified: Secondary | ICD-10-CM

## 2017-07-13 DIAGNOSIS — E1140 Type 2 diabetes mellitus with diabetic neuropathy, unspecified: Secondary | ICD-10-CM

## 2017-07-13 DIAGNOSIS — E785 Hyperlipidemia, unspecified: Secondary | ICD-10-CM | POA: Diagnosis not present

## 2017-07-13 LAB — LIPID PANEL
Cholesterol: 81 mg/dL (ref 0–200)
HDL: 17.7 mg/dL — ABNORMAL LOW (ref >39.00)
LDL Cholesterol: 36 mg/dL (ref 0–99)
NonHDL: 62.8
Total CHOL/HDL Ratio: 5
Triglycerides: 133 mg/dL (ref 0.0–149.0)
VLDL: 26.6 mg/dL (ref 0.0–40.0)

## 2017-07-13 LAB — HEMOGLOBIN A1C: Hgb A1c MFr Bld: 8 % — ABNORMAL HIGH (ref 4.6–6.5)

## 2017-07-13 LAB — COMPREHENSIVE METABOLIC PANEL
ALT: 18 U/L (ref 0–53)
AST: 24 U/L (ref 0–37)
Albumin: 3.8 g/dL (ref 3.5–5.2)
Alkaline Phosphatase: 40 U/L (ref 39–117)
BUN: 31 mg/dL — ABNORMAL HIGH (ref 6–23)
CO2: 27 mEq/L (ref 19–32)
Calcium: 8.6 mg/dL (ref 8.4–10.5)
Chloride: 105 mEq/L (ref 96–112)
Creatinine, Ser: 1.26 mg/dL (ref 0.40–1.50)
GFR: 57.95 mL/min — ABNORMAL LOW (ref >60.00)
Glucose, Bld: 131 mg/dL — ABNORMAL HIGH (ref 70–99)
Potassium: 3.6 mEq/L (ref 3.5–5.1)
Sodium: 138 mEq/L (ref 135–145)
Total Bilirubin: 0.4 mg/dL (ref 0.2–1.2)
Total Protein: 7 g/dL (ref 6.0–8.3)

## 2017-07-13 LAB — CBC WITH DIFFERENTIAL/PLATELET
Basophils Absolute: 0 10*3/uL (ref 0.0–0.1)
Basophils Relative: 0.4 % (ref 0.0–3.0)
Eosinophils Absolute: 0.1 10*3/uL (ref 0.0–0.7)
Eosinophils Relative: 1.8 % (ref 0.0–5.0)
HCT: 38.9 % — ABNORMAL LOW (ref 39.0–52.0)
Hemoglobin: 13.1 g/dL (ref 13.0–17.0)
Lymphocytes Relative: 17.4 % (ref 12.0–46.0)
Lymphs Abs: 1.1 10*3/uL (ref 0.7–4.0)
MCHC: 33.6 g/dL (ref 30.0–36.0)
MCV: 90.2 fl (ref 78.0–100.0)
Monocytes Absolute: 0.7 10*3/uL (ref 0.1–1.0)
Monocytes Relative: 11.5 % (ref 3.0–12.0)
Neutro Abs: 4.4 10*3/uL (ref 1.4–7.7)
Neutrophils Relative %: 68.9 % (ref 43.0–77.0)
Platelets: 171 10*3/uL (ref 150.0–400.0)
RBC: 4.31 Mil/uL (ref 4.22–5.81)
RDW: 13.5 % (ref 11.5–15.5)
WBC: 6.3 10*3/uL (ref 4.0–10.5)

## 2017-07-13 LAB — VITAMIN D 25 HYDROXY (VIT D DEFICIENCY, FRACTURES): VITD: 46.46 ng/mL (ref 30.00–100.00)

## 2017-07-16 ENCOUNTER — Ambulatory Visit: Payer: Medicare Other | Admitting: Physical Medicine & Rehabilitation

## 2017-07-17 ENCOUNTER — Ambulatory Visit: Payer: Medicare Other | Admitting: Physical Medicine & Rehabilitation

## 2017-07-17 ENCOUNTER — Encounter: Payer: Medicare Other | Admitting: Family Medicine

## 2017-07-17 DIAGNOSIS — C44319 Basal cell carcinoma of skin of other parts of face: Secondary | ICD-10-CM | POA: Diagnosis not present

## 2017-07-18 ENCOUNTER — Encounter: Payer: Self-pay | Admitting: Family Medicine

## 2017-07-18 ENCOUNTER — Ambulatory Visit (INDEPENDENT_AMBULATORY_CARE_PROVIDER_SITE_OTHER): Payer: Medicare Other | Admitting: Family Medicine

## 2017-07-18 ENCOUNTER — Encounter: Payer: Medicare Other | Admitting: Family Medicine

## 2017-07-18 VITALS — BP 118/60 | HR 76 | Temp 97.6°F | Ht 68.0 in | Wt 189.0 lb

## 2017-07-18 DIAGNOSIS — N183 Chronic kidney disease, stage 3 unspecified: Secondary | ICD-10-CM

## 2017-07-18 DIAGNOSIS — E785 Hyperlipidemia, unspecified: Secondary | ICD-10-CM

## 2017-07-18 DIAGNOSIS — Z0000 Encounter for general adult medical examination without abnormal findings: Secondary | ICD-10-CM

## 2017-07-18 DIAGNOSIS — N138 Other obstructive and reflux uropathy: Secondary | ICD-10-CM

## 2017-07-18 DIAGNOSIS — Z23 Encounter for immunization: Secondary | ICD-10-CM | POA: Diagnosis not present

## 2017-07-18 DIAGNOSIS — R202 Paresthesia of skin: Secondary | ICD-10-CM

## 2017-07-18 DIAGNOSIS — N401 Enlarged prostate with lower urinary tract symptoms: Secondary | ICD-10-CM | POA: Diagnosis not present

## 2017-07-18 DIAGNOSIS — G2581 Restless legs syndrome: Secondary | ICD-10-CM | POA: Diagnosis not present

## 2017-07-18 DIAGNOSIS — Z7189 Other specified counseling: Secondary | ICD-10-CM

## 2017-07-18 DIAGNOSIS — Z0001 Encounter for general adult medical examination with abnormal findings: Secondary | ICD-10-CM | POA: Diagnosis not present

## 2017-07-18 DIAGNOSIS — E1140 Type 2 diabetes mellitus with diabetic neuropathy, unspecified: Secondary | ICD-10-CM | POA: Diagnosis not present

## 2017-07-18 DIAGNOSIS — I10 Essential (primary) hypertension: Secondary | ICD-10-CM

## 2017-07-18 DIAGNOSIS — E039 Hypothyroidism, unspecified: Secondary | ICD-10-CM

## 2017-07-18 DIAGNOSIS — I69351 Hemiplegia and hemiparesis following cerebral infarction affecting right dominant side: Secondary | ICD-10-CM | POA: Diagnosis not present

## 2017-07-18 DIAGNOSIS — F331 Major depressive disorder, recurrent, moderate: Secondary | ICD-10-CM

## 2017-07-18 LAB — IBC PANEL
Iron: 72 ug/dL (ref 42–165)
Saturation Ratios: 21.4 % (ref 20.0–50.0)
Transferrin: 240 mg/dL (ref 212.0–360.0)

## 2017-07-18 LAB — TSH: TSH: 3.98 u[IU]/mL (ref 0.35–4.50)

## 2017-07-18 LAB — VITAMIN B12: Vitamin B-12: >1500 pg/mL — ABNORMAL HIGH (ref 211–911)

## 2017-07-18 LAB — T4, FREE: Free T4: 0.72 ng/dL (ref 0.60–1.60)

## 2017-07-18 MED ORDER — DULOXETINE HCL 60 MG PO CPEP: 60.0000 mg | ORAL_CAPSULE | Freq: Every day | ORAL | 1 refills | Status: DC

## 2017-07-18 MED ORDER — PRAMIPEXOLE DIHYDROCHLORIDE 1 MG PO TABS: ORAL_TABLET | ORAL | 1 refills | Status: DC

## 2017-07-18 NOTE — Assessment & Plan Note (Signed)
Chronic, stable. Continue low dose losartan and metoprolol.

## 2017-07-18 NOTE — Assessment & Plan Note (Signed)
Residual deficit - continue plavix and statin.

## 2017-07-18 NOTE — Assessment & Plan Note (Signed)
Followed by urology.   

## 2017-07-18 NOTE — Assessment & Plan Note (Signed)

## 2017-07-18 NOTE — Progress Notes (Signed)
BP 118/60 (BP Location: Left Arm, Patient Position: Sitting, Cuff Size: Normal)   Pulse 76   Temp 97.6 F (36.4 C) (Oral)   Ht 5\' 8"  (1.727 m)   Wt 189 lb (85.7 kg)   SpO2 97%   BMI 28.74 kg/m    CC: AMW f/u visit Subjective:    Patient ID: James Spurr, MD, male    DOB: [birth year], 83 y.o.   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 Wants to increasing dose)   Upcoming reunion/celebration at prior office 07/28/2017! URI sxs a few weeks ago - this improved with decongestant. Persistent mild hoarseness.  RLS - up to 2mg  mirapex in afternoon and 1 in evening with breakthrough restless legs and with discomfort described as burning discomfort. Asks about increasing dose. He is only taking gabapentin 300mg  once daily although this is written twice daily.   Preventative: COLONOSCOPY Date: 06/2011 mult polyps adenomatous, severe diverticulosis, rpt 3 yrs [name]). Pt decides to defer for now.  Prostate - checked by urology  Flu shot today  Pneumovax - 09/2007. prevnar 2015  Td - 06/2016 zostavax - 09/2007  shingrix - discussed Advanced directives: Has advanced directives at home. Will bring copy for chart. Daughter James Bell is HCPOA. Seat belt use discussed Sunscreen use discussed. No changing moles on skin. Sees derm Non smoker Alcohol - none Trouble with IADLs 2/2 stroke.   Caffeine: 1 cup coffee/day  Occupation: ophthalmologist, retired Edu: MD Activity: going to gym 3x/wk with trainer Diet: some water, fruits/vegetables some, red meat a few times, fish 1x/wk POA - James Bell (Daughter)  Relevant past medical, surgical, family and social history reviewed and updated as indicated. Interim medical history since our last visit reviewed. Allergies and medications reviewed and updated. Outpatient Medications Prior to Visit  Medication Sig Dispense Refill  .  clopidogrel (PLAVIX) 75 MG tablet TAKE 1 TABLET DAILY 90 tablet 0  . Coenzyme Q10 (COQ10) 150 MG CAPS Take 1 capsule by mouth 2 (two) times daily.    . CRESTOR 40 MG tablet TAKE 1 TABLET DAILY 90 tablet 0  . diazepam (VALIUM) 5 MG tablet Take 1 tablet (5 mg total) by mouth every 8 (eight) hours as needed for muscle spasms. 90 tablet 0  . diclofenac sodium (VOLTAREN) 1 % GEL Apply 2 g topically 4 (four) times daily. 3 Tube 1  . FOLBEE 2.5-25-1 MG TABS tablet TAKE 1 TABLET DAILY 90 tablet 1  . gabapentin (NEURONTIN) 300 MG capsule TAKE 1 CAPSULE TWICE A DAY (NEW DIRECTIONS) 180 capsule 1  . ibuprofen (ADVIL,MOTRIN) 200 MG tablet Take 400 mg by mouth every 6 (six) hours as needed for mild pain or moderate pain.    . Insulin Degludec (TRESIBA FLEXTOUCH) 200 UNIT/ML SOPN Inject 80 Units into the skin at bedtime.     . Insulin Pen Needle (NOVOFINE) 30G X 8 MM MISC Use to inject insulin once daily dx:E11.40 300 each 3  . levothyroxine (LEVOTHROID) 25 MCG tablet Take 1 tablet (25 mcg total) by mouth daily before breakfast. Per endo [name]    . losartan (COZAAR) 25 MG tablet Take 1 tablet (25 mg total) by mouth daily. 90 tablet 3  . metoprolol tartrate (LOPRESSOR) 25 MG tablet TAKE ONE-HALF (1/2) TABLET TWICE A DAY 90 tablet 1  . nitroGLYCERIN (NITROSTAT) 0.4 MG SL tablet DISSOLVE 1 TABLET UNDER THE TONGUE EVERY 5 MINUTES  AS NEEDED FOR CHEST PAIN 25 tablet 1  . omega-3 acid ethyl esters (LOVAZA) 1 g capsule Take 2 capsules (2 g total) by mouth 2 (two) times daily. 360 capsule 1  . omeprazole (PRILOSEC) 40 MG capsule TAKE 1 CAPSULE DAILY 90 capsule 1  . traZODone (DESYREL) 50 MG tablet Take 50 mg by mouth at bedtime as needed for sleep.    . vitamin C (ASCORBIC ACID) 500 MG tablet Take 1,000 mg by mouth daily.     . DULoxetine (CYMBALTA) 60 MG capsule Take 1 capsule (60 mg total) by mouth daily. 90 capsule 1  . pramipexole (MIRAPEX) 1 MG tablet TAKE 2 TABLETS DAILY AT 3 P.M. 180 tablet 0   No  facility-administered medications prior to visit.      Per HPI unless specifically indicated in ROS section below Review of Systems     Objective:    BP 118/60 (BP Location: Left Arm, Patient Position: Sitting, Cuff Size: Normal)   Pulse 76   Temp 97.6 F (36.4 C) (Oral)   Ht 5\' 8"  (1.727 m)   Wt 189 lb (85.7 kg)   SpO2 97%   BMI 28.74 kg/m   Wt Readings from Last 3 Encounters:  07/18/17 189 lb (85.7 kg)  02/01/17 190 lb 4 oz (86.3 kg)  10/25/16 190 lb (86.2 kg)    Physical Exam  Constitutional: He is oriented to person, place, and time. He appears well-developed and well-nourished. No distress.  HENT:  Head: Normocephalic and atraumatic.  Right Ear: Hearing, tympanic membrane, external ear and ear canal normal.  Left Ear: Hearing, tympanic membrane, external ear and ear canal normal.  Nose: Nose normal.  Mouth/Throat: Uvula is midline, oropharynx is clear and moist and mucous membranes are normal. No oropharyngeal exudate, posterior oropharyngeal edema or posterior oropharyngeal erythema.  Eyes: Pupils are equal, round, and reactive to light. Conjunctivae and EOM are normal. No scleral icterus.  Neck: Normal range of motion. Neck supple. Carotid bruit is not present. No thyromegaly present.  Cardiovascular: Normal rate, regular rhythm, normal heart sounds and intact distal pulses.   No murmur heard. Pulses:      Radial pulses are 2+ on the right side, and 2+ on the left side.  Pulmonary/Chest: Effort normal and breath sounds normal. No respiratory distress. He has no wheezes. He has no rales.  Abdominal: Soft. Bowel sounds are normal. He exhibits no distension and no mass. There is no tenderness. There is no rebound and no guarding.  Musculoskeletal: Normal range of motion. He exhibits no edema.  Lymphadenopathy:    He has no cervical adenopathy.  Neurological: He is alert and oriented to person, place, and time.  CN grossly intact, station and gait intact Recall  3/3 Calculation 4/5 serial 7s  Skin: Skin is warm and dry. No rash noted.  Psychiatric: He has a normal mood and affect. His behavior is normal. Judgment and thought content normal.  Nursing note and vitals reviewed.  Results for orders placed or performed in visit on 07/18/17  Vitamin B12  Result Value Ref Range   Vitamin B-12 >1500 (H) 211 - 911 pg/mL  TSH  Result Value Ref Range   TSH 3.98 0.35 - 4.50 uIU/mL  T4, free  Result Value Ref Range   Free T4 0.72 0.60 - 1.60 ng/dL  IBC panel  Result Value Ref Range   Iron 72 42 - 165 ug/dL   Transferrin 240.0 212.0 - 360.0 mg/dL   Saturation Ratios 21.4 20.0 -  50.0 %   Lab Results  Component Value Date   HGBA1C 8.0 (H) 07/13/2017    Lab Results  Component Value Date   CREATININE 1.26 07/13/2017       Assessment & Plan:   Problem List Items Addressed This Visit    Advanced care planning/counseling discussion    Advanced directives: Has advanced directives at home. Will bring copy for chart. Daughter James Bell is HCPOA.      BPH with obstruction/lower urinary tract symptoms    Followed by urology.       CKD (chronic kidney disease) stage 3, GFR 30-59 ml/min (HCC)    Chronic, stable. Continue current regimen.       Hemiparesis affecting right side as late effect of stroke ([place])    Residual deficit - continue plavix and statin.       HLD (hyperlipidemia)    Chronic, stable. Continue crestor 40mg  daily and lovaza The ASCVD Risk score Mikey Bussing DC Jr., et al., 2013) failed to calculate for the following reasons:   The 2013 ASCVD risk score is only valid for ages 59 to 57   The patient has a prior MI or stroke diagnosis       HTN (hypertension)    Chronic, stable. Continue low dose losartan and metoprolol.      MDD (major depressive disorder), recurrent episode, moderate (HCC)    Chronic, stable on cymbalta 60mg  daily with trazodone QHS PRN sleep - continue.       Relevant Medications   DULoxetine (CYMBALTA) 60  MG capsule   Medicare annual wellness visit, subsequent - Primary    I have personally reviewed the Medicare Annual Wellness questionnaire and have noted 1. The patient's medical and social history 2. Their use of alcohol, tobacco or illicit drugs 3. Their current medications and supplements 4. The patient's functional ability including ADL's, fall risks, home safety risks and hearing or visual impairment. Cognitive function has been assessed and addressed as indicated.  5. Diet and physical activity 6. Evidence for depression or mood disorders The patients weight, height, BMI have been recorded in the chart. I have made referrals, counseling and provided education to the patient based on review of the above and I have provided the pt with a written personalized care plan for preventive services. Provider list updated.. See scanned questionairre as needed for further documentation. Reviewed preventative protocols and updated unless pt declined.       Restless leg syndrome    Progressive despite continued titration of mirapex to 2mg  in afternoon and 1mg  at bedtime. rec against continued titration - he is already on a high dose. Will check iron panel and TSH r/o other cause of worsening RLS. Encouraged pt try gabapentin 300mg  bid as well. If no improvement, consider trial ropinirole      Relevant Orders   TSH (Completed)   T4, free (Completed)   IBC panel (Completed)   Type 2 diabetes, controlled, with neuropathy ([place])    Continue f/u with endo [name]. Reviewed elevated A1c with patient.        Other Visit Diagnoses    Need for immunization against influenza       Relevant Orders   Flu Vaccine QUAD 6+ mos PF IM (Fluarix Quad PF) (Completed)   Paresthesias       Relevant Orders   Vitamin B12 (Completed)   Hypothyroidism, unspecified type       Relevant Orders   TSH (Completed)   T4, free (Completed)  Follow up plan: Return in about 6 months (around 01/15/2018) for follow  up visit.  [name], MD

## 2017-07-18 NOTE — Assessment & Plan Note (Signed)
Continue f/u with endo [name]. Reviewed elevated A1c with patient.

## 2017-07-18 NOTE — Patient Instructions (Addendum)
Flu shot today Labs today to check iron and thyroid levels.  If interested, check with pharmacy about new 2 shot shingles series (shingrix).  Increase gabapentin to 300mg  twice daily - caution with dizziness side effect Your are doing well today. Return as needed or in 6 months for follow up visit.   Health Maintenance, Male A healthy lifestyle and preventive care is important for your health and wellness. Ask your health care provider about what schedule of regular examinations is right for you. What should I know about weight and diet? Eat a Healthy Diet  Eat plenty of vegetables, fruits, whole grains, low-fat dairy products, and lean protein.  Do not eat a lot of foods high in solid fats, added sugars, or salt.  Maintain a Healthy Weight Regular exercise can help you achieve or maintain a healthy weight. You should:  Do at least 150 minutes of exercise each week. The exercise should increase your heart rate and make you sweat (moderate-intensity exercise).  Do strength-training exercises at least twice a week.  Watch Your Levels of Cholesterol and Blood Lipids  Have your blood tested for lipids and cholesterol every 5 years starting at 81 years of age. If you are at high risk for heart disease, you should start having your blood tested when you are 81 years old. You may need to have your cholesterol levels checked more often if: ? Your lipid or cholesterol levels are high. ? You are older than 82 years of age. ? You are at high risk for heart disease.  What should I know about cancer screening? Many types of cancers can be detected early and may often be prevented. Lung Cancer  You should be screened every year for lung cancer if: ? You are a current smoker who has smoked for at least 30 years. ? You are a former smoker who has quit within the past 15 years.  Talk to your health care provider about your screening options, when you should start screening, and how often you  should be screened.  Colorectal Cancer  Routine colorectal cancer screening usually begins at 81 years of age and should be repeated every 5-10 years until you are 81 years old. You may need to be screened more often if early forms of precancerous polyps or small growths are found. Your health care provider may recommend screening at an earlier age if you have risk factors for colon cancer.  Your health care provider may recommend using home test kits to check for hidden blood in the stool.  A small camera at the end of a tube can be used to examine your colon (sigmoidoscopy or colonoscopy). This checks for the earliest forms of colorectal cancer.  Prostate and Testicular Cancer  Depending on your age and overall health, your health care provider may do certain tests to screen for prostate and testicular cancer.  Talk to your health care provider about any symptoms or concerns you have about testicular or prostate cancer.  Skin Cancer  Check your skin from head to toe regularly.  Tell your health care provider about any new moles or changes in moles, especially if: ? There is a change in a mole's size, shape, or color. ? You have a mole that is larger than a pencil eraser.  Always use sunscreen. Apply sunscreen liberally and repeat throughout the day.  Protect yourself by wearing long sleeves, pants, a wide-brimmed hat, and sunglasses when outside.  What should I know about heart disease, diabetes,  and high blood pressure?  If you are 37-20 years of age, have your blood pressure checked every 3-5 years. If you are 52 years of age or older, have your blood pressure checked every year. You should have your blood pressure measured twice-once when you are at a hospital or clinic, and once when you are not at a hospital or clinic. Record the average of the two measurements. To check your blood pressure when you are not at a hospital or clinic, you can use: ? An automated blood pressure  machine at a pharmacy. ? A home blood pressure monitor.  Talk to your health care provider about your target blood pressure.  If you are between 47-66 years old, ask your health care provider if you should take aspirin to prevent heart disease.  Have regular diabetes screenings by checking your fasting blood sugar level. ? If you are at a normal weight and have a low risk for diabetes, have this test once every three years after the age of 14. ? If you are overweight and have a high risk for diabetes, consider being tested at a younger age or more often.  A one-time screening for abdominal aortic aneurysm (AAA) by ultrasound is recommended for men aged 68-75 years who are current or former smokers. What should I know about preventing infection? Hepatitis B If you have a higher risk for hepatitis B, you should be screened for this virus. Talk with your health care provider to find out if you are at risk for hepatitis B infection. Hepatitis C Blood testing is recommended for:  Everyone born from [birth year] through 1963.  Anyone with known risk factors for hepatitis C.  Sexually Transmitted Diseases (STDs)  You should be screened each year for STDs including gonorrhea and chlamydia if: ? You are sexually active and are younger than 81 years of age. ? You are older than 81 years of age and your health care provider tells you that you are at risk for this type of infection. ? Your sexual activity has changed since you were last screened and you are at an increased risk for chlamydia or gonorrhea. Ask your health care provider if you are at risk.  Talk with your health care provider about whether you are at high risk of being infected with HIV. Your health care provider may recommend a prescription medicine to help prevent HIV infection.  What else can I do?  Schedule regular health, dental, and eye exams.  Stay current with your vaccines (immunizations).  Do not use any tobacco products,  such as cigarettes, chewing tobacco, and e-cigarettes. If you need help quitting, ask your health care provider.  Limit alcohol intake to no more than 2 drinks per day. One drink equals 12 ounces of beer, 5 ounces of wine, or 1 ounces of hard liquor.  Do not use street drugs.  Do not share needles.  Ask your health care provider for help if you need support or information about quitting drugs.  Tell your health care provider if you often feel depressed.  Tell your health care provider if you have ever been abused or do not feel safe at home. This information is not intended to replace advice given to you by your health care provider. Make sure you discuss any questions you have with your health care provider. Document Released: 03/02/2008 Document Revised: 05/03/2016 Document Reviewed: 06/08/2015 Elsevier Interactive Patient Education  [employer].

## 2017-07-18 NOTE — Assessment & Plan Note (Signed)
Advanced directives: Has advanced directives at home. Will bring copy for chart. Daughter Rebecca Greene is HCPOA.  

## 2017-07-18 NOTE — Assessment & Plan Note (Addendum)
Chronic, stable on cymbalta 60mg  daily with trazodone QHS PRN sleep - continue.

## 2017-07-18 NOTE — Assessment & Plan Note (Signed)
Chronic, stable. Continue current regimen. 

## 2017-07-18 NOTE — Assessment & Plan Note (Addendum)
Chronic, stable. Continue crestor 40mg  daily and lovaza The ASCVD Risk score Mikey Bussing DC Jr., et al., 2013) failed to calculate for the following reasons:   The 2013 ASCVD risk score is only valid for ages 2 to 9   The patient has a prior MI or stroke diagnosis

## 2017-07-18 NOTE — Assessment & Plan Note (Addendum)
Progressive despite continued titration of mirapex to 2mg  in afternoon and 1mg  at bedtime. rec against continued titration - he is already on a high dose. Will check iron panel and TSH r/o other cause of worsening RLS. Encouraged pt try gabapentin 300mg  bid as well. If no improvement, consider trial ropinirole

## 2017-07-24 ENCOUNTER — Telehealth: Payer: Self-pay | Admitting: Family Medicine

## 2017-07-24 NOTE — Telephone Encounter (Signed)
Copied from [place]. Topic: General - Other >> Jul 24, 2017 10:10 AM Conception Chancy, NT wrote: Reason for CRM: [name] calling from [facility] care to confirm RX sent for Cpap machine

## 2017-07-24 NOTE — Telephone Encounter (Signed)
[name]  From   [facility]  Stated  She  Faxed  A  rx for  A   CPAP   Machine for   Dr  Jinny Sanders    Chart  Review  Could not find  An electronic  Record  . She   States  She  Will  Refax  The  Request   Today . Fax  Number provided.

## 2017-07-24 NOTE — Telephone Encounter (Signed)
Forwarded rx to Dr. Damita Dunnings to address in Dr. Synthia Innocent absence. Placed in Dr. Josefine Class box.

## 2017-07-26 ENCOUNTER — Encounter: Payer: Self-pay | Admitting: Physical Medicine & Rehabilitation

## 2017-07-26 ENCOUNTER — Encounter: Payer: Medicare Other | Attending: Physical Medicine & Rehabilitation

## 2017-07-26 ENCOUNTER — Ambulatory Visit (HOSPITAL_BASED_OUTPATIENT_CLINIC_OR_DEPARTMENT_OTHER): Payer: Medicare Other | Admitting: Physical Medicine & Rehabilitation

## 2017-07-26 VITALS — BP 133/71 | HR 64

## 2017-07-26 DIAGNOSIS — G8110 Spastic hemiplegia affecting unspecified side: Secondary | ICD-10-CM | POA: Diagnosis not present

## 2017-07-26 DIAGNOSIS — R252 Cramp and spasm: Secondary | ICD-10-CM | POA: Diagnosis not present

## 2017-07-26 NOTE — Progress Notes (Signed)
Botox Injection for spasticity using needle EMG guidance  Dilution: 200 Units/ml Indication: Severe spasticity which interferes with ADL,mobility and/or  hygiene and is unresponsive to medication management and other conservative care Informed consent was obtained after describing risks and benefits of the procedure with the patient. This includes bleeding, bruising, infection, excessive weakness, or medication side effects. A REMS form is on file and signed. Needle: 27g 1" needle electrode Number of units per muscle  Biceps75 FCR25  FDS25  Lumbrical 12 and 3, 12.5 Units each=37.5 FDP with estim to Identify DIP flexion digits 2 and 3, 12.5 units injected All injections were done after obtaining appropriate EMG activity and after negative drawback for blood. The patient tolerated the procedure well. Post procedure instructions were given. A followup appointment was made.

## 2017-07-26 NOTE — Patient Instructions (Signed)

## 2017-07-26 NOTE — Telephone Encounter (Signed)
Per EMR, OSA followed by Dr Rexene Alberts neurology.  This needs to go to Dr. Rexene Alberts.  Thanks.

## 2017-07-26 NOTE — Telephone Encounter (Signed)
Form faxed to Dr Rexene Alberts at [facility].

## 2017-07-31 DIAGNOSIS — E1142 Type 2 diabetes mellitus with diabetic polyneuropathy: Secondary | ICD-10-CM | POA: Diagnosis not present

## 2017-07-31 DIAGNOSIS — Z794 Long term (current) use of insulin: Secondary | ICD-10-CM | POA: Diagnosis not present

## 2017-07-31 DIAGNOSIS — E1165 Type 2 diabetes mellitus with hyperglycemia: Secondary | ICD-10-CM | POA: Diagnosis not present

## 2017-07-31 DIAGNOSIS — E785 Hyperlipidemia, unspecified: Secondary | ICD-10-CM | POA: Diagnosis not present

## 2017-07-31 DIAGNOSIS — E039 Hypothyroidism, unspecified: Secondary | ICD-10-CM | POA: Diagnosis not present

## 2017-08-06 ENCOUNTER — Telehealth: Payer: Self-pay

## 2017-08-06 NOTE — Telephone Encounter (Signed)
Received a form from [employer] for Dr. Rexene Alberts to fill out regarding pt's cpap supplies. Pt told us he was using [facility] as his DME. I am just calling to confirm that he does want [name] sending this order to [place] now.  I called pt to discuss. No answer, left a message asking him to call me back.

## 2017-08-07 NOTE — Telephone Encounter (Signed)
I called pt. He says that he is unsure why [facility] is sending [name] a request for orders and asked that he keep using [facility] as his DME. Pt has no questions for me at this time.

## 2017-08-22 ENCOUNTER — Telehealth: Payer: Self-pay | Admitting: Family Medicine

## 2017-08-22 NOTE — Telephone Encounter (Signed)
Copied from [place] [id number]. Topic: Quick Communication - See Telephone Encounter >> Aug 22, 2017  2:00 PM Arletha Grippe wrote: CRM for notification. See Telephone encounter for:   08/22/17. Pt called - states that Dr Darnell Level prescribed him a new med when he was in the office last (07/18/17) and [facility] was [facility].  Pt does not know name of medication, only that it was new, and that it was for diabetes.  Pt has not received this medication.  Pt request cb at [phone]

## 2017-08-24 NOTE — Telephone Encounter (Signed)
We had discussed mirapex dosing and I had suggested he try gabapentin for restless legs.  I don't think we talked about starting diabetes med as that is usually managed by [name]?

## 2017-08-24 NOTE — Telephone Encounter (Signed)
Pt is requesting information on new medication that was prescribed for him. He does not know the name of it. Requesting a call back, [phone].

## 2017-08-24 NOTE — Telephone Encounter (Signed)
Contacted pt to confirm which medication he is inquiring about. He states that he was to have a DM [facility] but does not remember the name of the medication. I have searched through his OV note and do not see that a diabetic medication was sent. Pt is requesting a message be sent to Dr Danise Mina in hopes that "he will remember what he was supposed to send." pls advise

## 2017-08-24 NOTE — Telephone Encounter (Signed)
Attempted to call pt back at number provided [phone]) but it has been disconnected or no longer in service.   Left message on vm on cell # [phone]) listed in pt's chart for pt to call back. [Need to relay message per Dr. G.]

## 2017-08-30 ENCOUNTER — Telehealth: Payer: Self-pay | Admitting: Family Medicine

## 2017-08-30 NOTE — Telephone Encounter (Signed)
Copied from [place]. Topic: Quick Communication - See Telephone Encounter >> Aug 30, 2017 12:44 PM Boyd Kerbs wrote: CRM for notification. See Telephone encounter for:  Sharyn Creamer Irene [phone]  regarding fax sent for C-pap supplies. Need approval from Doctor. She is refaxing form today. 08/30/17.

## 2017-08-31 ENCOUNTER — Encounter: Payer: Medicare Other | Attending: Physical Medicine & Rehabilitation

## 2017-08-31 ENCOUNTER — Encounter: Payer: Self-pay | Admitting: Physical Medicine & Rehabilitation

## 2017-08-31 ENCOUNTER — Ambulatory Visit (HOSPITAL_BASED_OUTPATIENT_CLINIC_OR_DEPARTMENT_OTHER): Payer: Medicare Other | Admitting: Physical Medicine & Rehabilitation

## 2017-08-31 VITALS — BP 149/82 | HR 72

## 2017-08-31 DIAGNOSIS — R252 Cramp and spasm: Secondary | ICD-10-CM | POA: Diagnosis not present

## 2017-08-31 DIAGNOSIS — G8110 Spastic hemiplegia affecting unspecified side: Secondary | ICD-10-CM

## 2017-08-31 DIAGNOSIS — I69398 Other sequelae of cerebral infarction: Secondary | ICD-10-CM

## 2017-08-31 DIAGNOSIS — R269 Unspecified abnormalities of gait and mobility: Secondary | ICD-10-CM

## 2017-08-31 NOTE — Telephone Encounter (Signed)
[place] fax sent for C-pap supplies and needs approval from doctor.

## 2017-08-31 NOTE — Progress Notes (Signed)
Subjective:    Patient ID: James Spurr, MD, male    DOB: 04-22-1931, 83 y.o.   MRN: [id number]  HPI 11/8 Botox injection RUE  Good reduction in spasticity  No further falls but a couple near falls R LE gave way Pain Inventory Average Pain 0 Pain Right Now 0 My pain is na  In the last 24 hours, has pain interfered with the following? General activity 3 Relation with others 6 Enjoyment of life 7 What TIME of day is your pain at its worst? na Sleep (in general) Fair  Pain is worse with: na Pain improves with: na Relief from Meds: na  Mobility use a cane ability to climb steps?  yes do you drive?  yes  Function Do you have any goals in this area?  no  Neuro/Psych No problems in this area  Prior Studies Any changes since last visit?  no  Physicians involved in your care Any changes since last visit?  no   Family History  Problem Relation Age of Onset  . Stroke Father   . Lung cancer Father 48        smoker  . Stroke Brother   . Diabetes Brother 32  . Lung cancer Brother   . Alcoholism Sister   . Kidney disease Sister   . Coronary artery disease Neg Hx    Social History   Socioeconomic History  . Marital status: Unknown    Spouse name: Not on file  . Number of children: 2  . Years of education: MD  . Highest education level: Not on file  Social Needs  . Financial resource strain: Not on file  . Food insecurity - worry: Not on file  . Food insecurity - inability: Not on file  . Transportation needs - medical: Not on file  . Transportation needs - non-medical: Not on file  Occupational History  . Occupation: retired    Fish farm manager: RETIRED  Tobacco Use  . Smoking status: Never Smoker  . Smokeless tobacco: Never Used  . Tobacco comment: "used to smoke pipe when I was younger"  Substance and Sexual Activity  . Alcohol use: Yes    Alcohol/week: 2.4 oz    Types: 2 Glasses of wine, 2 Shots of liquor per week    Comment: occasional  . Drug  use: No  . Sexual activity: Yes  Other Topics Concern  . Not on file  Social History Narrative   Caffeine: 1 cup coffee/day    Occupation: ophthalmologist, retired   Edu: MD   Activity: going to gym 3x/wk with trainer   Diet: some water, fruits/vegetables some, red meat a few times, fish 1x/wk   POA - [name] (Daughter)   Past Surgical History:  Procedure Laterality Date  . ABI  2007   WNL  . CARDIAC CATHETERIZATION  2004   normal; [place] [facility]  . CARDIOVASCULAR STRESS TEST  02/2014   WNL, low risk scan, EF 68%  . CAROTID ENDARTERECTOMY    . CATARACT EXTRACTION W/ INTRAOCULAR LENS IMPLANT  11//2012 - 08/2011  . COLONOSCOPY  03/2011   poor prep, unable to biopsy polyp in tic fold, rpt 3 mo [name])  . COLONOSCOPY  06/2011   mult polyps adenomatous, severe diverticulosis, rpt 3 yrs [name])  . CYSTOSCOPY WITH URETHRAL DILATATION N/A 07/24/2013   Procedure: CYSTOSCOPY WITH URETHRAL DILATATION;  Surgeon: [name], MD;  Location: [facility];  Service: Urology;  Laterality: N/A;  . ESOPHAGOGASTRODUODENOSCOPY  2006  duodenitis, medual HH, Grade 2 reflux, mild gastritis  . ESOPHAGOGASTRODUODENOSCOPY  07/2011   mild chronic gastritis  . [place]  . [place]; 2002   left, then repaired  . MRI brain  04/2010   WNL  . PROSTATE SURGERY  02/2009   PVP (laser)  . SKIN CANCER EXCISION  2002   bridge of nose, BCC  . TONSILLECTOMY AND ADENOIDECTOMY  ~ 1945  . TRANSURETHRAL INCISION OF PROSTATE N/A 07/24/2013   Procedure: TRANSURETHRAL INCISION OF THE PROSTATE (TUIP);  Surgeon: [name], MD;  [facility];  Service: Urology;  Laterality: N/A;  . URETHRAL DILATION  2014   tann[place]baum   Past Medical History:  Diagnosis Date  . Anemia   . Basal cell carcinoma 2018   R temple   . Basal cell carcinoma of nose 2002   bridge of nose  . BPH with obstruction/lower urinary tract symptoms    failed flomax, uroxatral, myrbetriq - sees  urology [name]) pending videourodynamics  . CKD (chronic kidney disease) stage 3, GFR 30-59 ml/min (HCC) 03/11/2012   Renal US 02/2014 - multiple kidney cysts   . CVA (cerebral infarction) 03/07/12   slurred speech; right hemplegia, left handed - completed PT 07/2014 (17 sessions)  . Depression    mild, on cymbalta per prior PCP  . Erectile dysfunction    per urology (prostaglandin injection)  . GERD (gastroesophageal reflux disease)    with sliding HH  . H/O hiatal hernia   . History of kidney problems 12/2010   lacerated left kidney after fall  . HLD (hyperlipidemia)   . Kidney cysts 02/2014   bilateral (renal US)  . Migraines 03/11/12   now rare  . OSA on CPAP    sleep study 01/2014 - severe, CPAP at 10, previously on nuvigil (Dr. [name])  . Palpitations 01/2012   holter WNL, rare PAC/PVCs  . Presbycusis of both ears 2016   rec amplification [facility])  . Restless leg syndrome    well controlled on mirapex  . Rosacea    [place]  . TIA (transient ischemic attack)   . Type 2 diabetes, controlled, with neuropathy [facility]) 2011   [name] in [place]   There were no vitals taken for this visit.  Opioid Risk Score:   Fall Risk Score:  `1  Depression screen PHQ 2/9  Depression screen [facility] 2/9 07/18/2017 07/17/2016 01/17/2016 10/05/2015 06/23/2015 04/08/2015 11/06/2013  Decreased Interest 0 0 0 0 0 0 1  Down, Depressed, Hopeless 1 0 0 0 0 0 1  PHQ - 2 Score 1 0 0 0 0 0 2  Altered sleeping 2 - - - - - 3  Tired, decreased energy 2 - - - - - 1  Change in appetite 1 - - - - - 1  Feeling bad or failure about yourself  1 - - - - - 0  Trouble concentrating 1 - - - - - 1  Moving slowly or fidgety/restless 0 - - - - - 0  Suicidal thoughts 0 - - - - - 0  PHQ-9 Score 8 - - - - - 8  Some recent data might be hidden     Review of Systems  Constitutional: Negative.   HENT: Negative.   Eyes: Negative.   Respiratory: Negative.   Cardiovascular: Negative.   Gastrointestinal: Negative.     Endocrine: Negative.   Genitourinary: Negative.   Musculoskeletal: Negative.   Skin: Negative.  Allergic/Immunologic: Negative.   Neurological: Negative.   Hematological: Negative.   Psychiatric/Behavioral: Negative.   All other systems reviewed and are negative.      Objective:   Physical Exam +Romberg Ashworth grade 1 spasticity in the right finger flexor wrist flexor and elbow flexor. Motor strength is 4- at the right deltoid bicep tricep grip finger flexors and extensors  Gait is using a hemiwalker no evidence of toe drag or knee instability he does use a right AFO    Assessment & Plan:  #1.  Improvement in spasticity right upper extremity after Botox injection performed 6 weeks ago. Repeat current dosing in 6weeks Biceps75 FCR25  FDS25  Lumbrical 12 and 3, 12.5 Units each=37.5 FDP with estim to Identify DIP flexion digits 2 and 3, 12.5 units injected

## 2017-08-31 NOTE — Patient Instructions (Signed)
THerapy ordered please keep up with home exercises

## 2017-09-03 NOTE — Telephone Encounter (Signed)
Noted. Still looking for form.

## 2017-09-04 DIAGNOSIS — Z961 Presence of intraocular lens: Secondary | ICD-10-CM | POA: Diagnosis not present

## 2017-09-04 DIAGNOSIS — H40001 Preglaucoma, unspecified, right eye: Secondary | ICD-10-CM | POA: Diagnosis not present

## 2017-09-05 NOTE — Telephone Encounter (Addendum)
Spoke with [employer] notifying them we have not received form. Says they have that Dr. Darnell Level had previously denied form in 07/2017. So they did not send it.  Fwd to Dr. Darnell Level as Juluis Rainier.

## 2017-09-10 NOTE — Telephone Encounter (Addendum)
See neurology note from 08/06/2017. Pt does not use this supplier.

## 2017-09-12 ENCOUNTER — Other Ambulatory Visit: Payer: Self-pay | Admitting: Family Medicine

## 2017-09-21 ENCOUNTER — Other Ambulatory Visit: Payer: Self-pay | Admitting: Family Medicine

## 2017-09-26 ENCOUNTER — Ambulatory Visit: Payer: Medicare Other | Attending: Physical Medicine & Rehabilitation | Admitting: Physical Therapy

## 2017-09-26 ENCOUNTER — Encounter: Payer: Self-pay | Admitting: Physical Therapy

## 2017-09-26 DIAGNOSIS — R2681 Unsteadiness on feet: Secondary | ICD-10-CM | POA: Diagnosis not present

## 2017-09-26 DIAGNOSIS — R269 Unspecified abnormalities of gait and mobility: Secondary | ICD-10-CM | POA: Diagnosis not present

## 2017-09-26 DIAGNOSIS — M6281 Muscle weakness (generalized): Secondary | ICD-10-CM | POA: Insufficient documentation

## 2017-09-26 NOTE — Therapy (Signed)
[place] MAIN [facility] SERVICES [address] [place], [name], [address] Phone: [phone]   Fax:  [phone]  Physical Therapy Evaluation  Patient Details  Name: James HIGINBOTHAM, James Bell MRN: [id number] Date of Birth: Apr 23, 1931 Referring Provider: Charlett Blake    Encounter Date: 09/26/2017  PT End of Session - 09/26/17 1015    Visit Number  1    Number of Visits  25    Date for PT Re-Evaluation  12/19/17    PT Start Time  0900    PT Stop Time  1000    PT Time Calculation (min)  60 min    Equipment Utilized During Treatment  Gait belt;Back brace    Activity Tolerance  Patient tolerated treatment well;Patient limited by fatigue    Behavior During Therapy  [facility] for tasks assessed/performed       Past Medical History:  Diagnosis Date  . Anemia   . Basal cell carcinoma 2018   R temple   . Basal cell carcinoma of nose 2002   bridge of nose  . BPH with obstruction/lower urinary tract symptoms    failed flomax, uroxatral, myrbetriq - sees urology [name]) pending videourodynamics  . CKD (chronic kidney disease) stage 3, GFR 30-59 ml/min (HCC) 03/11/2012   Renal US 02/2014 - multiple kidney cysts   . CVA (cerebral infarction) 03/07/12   slurred speech; right hemplegia, left handed - completed PT 07/2014 (17 sessions)  . Depression    mild, on cymbalta per prior PCP  . Erectile dysfunction    per urology (prostaglandin injection)  . GERD (gastroesophageal reflux disease)    with sliding HH  . H/O hiatal hernia   . History of kidney problems 12/2010   lacerated left kidney after fall  . HLD (hyperlipidemia)   . Kidney cysts 02/2014   bilateral (renal US)  . Migraines 03/11/12   now rare  . OSA on CPAP    sleep study 01/2014 - severe, CPAP at 10, previously on nuvigil (Dr. [name])  . Palpitations 01/2012   holter WNL, rare PAC/PVCs  . Presbycusis of both ears 2016   rec amplification [facility])  . Restless leg syndrome     well controlled on mirapex  . Rosacea    [place]  . TIA (transient ischemic attack)   . Type 2 diabetes, controlled, with neuropathy [facility]) 2011   [name] in [facility]    Past Surgical History:  Procedure Laterality Date  . ABI  2007   WNL  . CARDIAC CATHETERIZATION  2004   normal; [place] [facility]  . CARDIOVASCULAR STRESS TEST  02/2014   WNL, low risk scan, EF 68%  . CAROTID ENDARTERECTOMY    . CATARACT EXTRACTION W/ INTRAOCULAR LENS IMPLANT  11//2012 - 08/2011  . COLONOSCOPY  03/2011   poor prep, unable to biopsy polyp in tic fold, rpt 3 mo [name])  . COLONOSCOPY  06/2011   mult polyps adenomatous, severe diverticulosis, rpt 3 yrs [name])  . CYSTOSCOPY WITH URETHRAL DILATATION N/A 07/24/2013   Procedure: CYSTOSCOPY WITH URETHRAL DILATATION;  Surgeon: [name], James Bell;  [facility];  Service: Urology;  Laterality: N/A;  . ESOPHAGOGASTRODUODENOSCOPY  2006   duodenitis, medual HH, Grade 2 reflux, mild gastritis  . ESOPHAGOGASTRODUODENOSCOPY  07/2011   mild chronic gastritis  . [place]  . [place]; 2002   left, then repaired  . MRI brain  04/2010   WNL  . PROSTATE  SURGERY  02/2009   PVP (laser)  . SKIN CANCER EXCISION  2002   bridge of nose, BCC  . TONSILLECTOMY AND ADENOIDECTOMY  ~ 1945  . TRANSURETHRAL INCISION OF PROSTATE N/A 07/24/2013   Procedure: TRANSURETHRAL INCISION OF THE PROSTATE (TUIP);  Surgeon: [name], James Bell;  [facility];  Service: Urology;  Laterality: N/A;  . URETHRAL DILATION  2014   [place]    There were no vitals filed for this visit.   Subjective Assessment - 09/26/17 0910    Subjective  Patient feels that his RLE is getting weak and he is not able to ambulate the same distances and his leg gets fatigued faster and it gives away . He walks 1/3 of a mile ; then his leg feels weak.  He had PT 1 year ago for LBP.   Pertinent History  [place] had CVA March 11 2012. He had rehab with hospital and SNF and  then outpatient therapy. He wears a lumbar corset to decrease his back pain if he is standing.     How long can you sit comfortably?  unlimited    How long can you stand comfortably?  10 or 15 mins    How long can you walk comfortably?  15 mins using a rollator outdoors.    Patient Stated Goals  Patient wants to be able to stand for longer periods and walk for longer periods.    Currently in Pain?  No/denies    Multiple Pain Sites  No         [facility] PT Assessment - 09/26/17 0917      Assessment   Medical Diagnosis  weakness    Referring Provider  [name] E     Hand Dominance  Right    Next James Bell Visit  Feb    Prior Therapy  1 year ago for back pain      Precautions   Precautions  Fall    Required Braces or Orthoses  Other Brace/Splint back corset      Restrictions   Weight Bearing Restrictions  No      Balance Screen   Has the patient fallen in the past 6 months  Yes    Has the patient had a decrease in activity level because of a fear of falling?   Yes    Is the patient reluctant to leave their home because of a fear of falling?   No      Home Environment   Living Environment  Private residence    Living Arrangements  Alone    Available Help at Discharge  Family;Friend(s)    Type of [place] to enter    Entrance Stairs-Number of Steps  1    Entrance Stairs-Rails  None    Home Layout  One level    Home Equipment  Wheelchair - manual;Grab bars - tub/shower;Grab bars - toilet;Hand held shower head;Shower seat;Walker - 4 wheels hemi walker, nu-step      Prior Function   Level of Independence  Independent with household mobility with device    Vocation  Retired    Leisure  cooking, playing cards      Pain: Patient does not have back pain currently day of evaluation but does have back pain when walking or standing and not using his corset  POSTURE: Rounded shoulders    PROM/AROM: WFL all ext ; right elbow does not achieve full  extension  STRENGTH:  Graded on a 0-5 scale Muscle Group Left Right                          Hip Flex 5/5 4/5  Hip Abd 5/5 -4/5  Hip Add 5/5 -3/5  Hip Ext 5/5 3/5  Hip IR/ER NT NT  Knee Flex 5/5 4/5  Knee Ext 5/5 4/5  Ankle DF 5/5 3+/5  Ankle PF 5/5 3/5   SENSATION: proprioception of right ankle impaired with ankle DF and inversion/eversion movements       FUNCTIONAL MOBILITY: independent with bed mobility Patient transfers independently but does stabilize on back of the chair with repeated sit to stand and has initial static stand instability with repeated sit to stand due to fatigue    BALANCE:  Standing Dynamic Balance  Normal Stand independently unsupported, able to weight shift and cross midline maximally   Good Stand independently unsupported, able to weight shift and cross midline moderately   Good-/Fair+ Stand independently unsupported, able to weight shift across midline minimally   Fair Stand independently unsupported, weight shift, and reach ipsilaterally, loss of balance when crossing midline x  Poor+ Able to stand with Min A and reach ipsilaterally, unable to weight shift   Poor Able to stand with Mod A and minimally reach ipsilaterally, unable to cross midline.    Static Standing Balance  Normal Able to maintain standing balance against maximal resistance   Good Able to maintain standing balance against moderate resistance   Good-/Fair+ Able to maintain standing balance against minimal resistance   Fair Able to stand unsupported without UE support and without LOB for 1-2 min x  Fair- Requires Min A and UE support to maintain standing without loss of balance   Poor+ Requires mod A and UE support to maintain standing without loss of balance   Poor Requires max A and UE support to maintain standing balance without loss       GAIT: Patient ambulates with hemi walker for 500 feet independently with R AFO and decreased gait speed  OUTCOME MEASURES: TEST  Outcome Interpretation  5 times sit<>stand 23.85* sec >60 yo, >15 sec indicates increased risk for falls  10 meter walk test        1.0         m/s <1.0 m/s indicates increased risk for falls; limited community ambulator  Timed up and Go 25.67                sec <14 sec indicates increased risk for falls  6 minute walk test          474      Feet 1000 feet is community ambulator            * using back of legs for stabilization .       Objective measurements completed on examination: See above findings.              PT Education - 09/26/17 1015    Education provided  Yes    Education Details  plan of care    Person(s) Educated  Patient    Methods  Explanation    Comprehension  Verbalized understanding       PT Short Term Goals - 09/26/17 1030      PT SHORT TERM GOAL #1   Title  Patient will ascend and descend step without railing and use of hemiwalker without loss of balance to be able  to safely get into his house.    Time  6    Period  Weeks    Status  New    Target Date  11/07/17        PT Long Term Goals - 09/26/17 1026      PT LONG TERM GOAL #1   Title  Patient will be able to stand for 1 hour without increase in pain to demonstrate improved tolerance for ADLs.     Baseline  10-15 mins with lumbar corset    Time  12    Period  Weeks    Status  New    Target Date  12/19/17      PT LONG TERM GOAL #2   Title  Patient will reduce timed up and go to <11 seconds to reduce fall risk and demonstrate improved transfer/gait ability.    Time  12    Period  Weeks    Status  New      PT LONG TERM GOAL #3   Title  Patient (> 34 years old) will complete five times sit to stand test in < 15 seconds indicating an increased LE strength and improved balance.    Time  12    Period  Weeks    Status  New    Target Date  12/19/17      PT LONG TERM GOAL #4   Title  Patient will increase six minute walk test distance to >600 for progression to community ambulator  and improve gait ability    Baseline  474 ft    Time  12    Period  Weeks    Status  New             Plan - 09/26/17 1017    Clinical Impression Statement  Patient presents for PT evaluation with reports of decreased ability to ambulate with hemi walker, and recent hx of falls. He is having back pain during standing and needs to wear a lumbar corset to decrease his back pain during walking and standing activities. He has decreased dynamic standing balance with a increased falls risk indicated with decreased outcome measures of decreased TUG and decreased 5 x sit to stand and decreased 6 MW test. He has decreased strength RLE hip and knee and ankle. He will benefit from skilled PT to improve moblity., increase strength, and decrease his falls risk    History and Personal Factors relevant to plan of care:  hx falls, lives alone, 84 yrs old    Clinical Presentation  Evolving    Clinical Decision Making  Moderate    Rehab Potential  Good    PT Frequency  2x / week    PT Duration  12 weeks    PT Treatment/Interventions  Dry needling;Manual techniques;Balance training;Neuromuscular re-education;Patient/family education;Therapeutic exercise;Therapeutic activities;Functional mobility training;Stair training;Gait training;Moist Heat;Cryotherapy;Electrical Stimulation    PT Next Visit Plan  add to HEP, balance training    Consulted and Agree with Plan of Care  Patient       Patient will benefit from skilled therapeutic intervention in order to improve the following deficits and impairments:  Abnormal gait, Decreased balance, Decreased endurance, Decreased mobility, Difficulty walking, Impaired sensation, Impaired tone, Decreased activity tolerance, Decreased strength, Decreased safety awareness, Pain  Visit Diagnosis: Muscle weakness  Abnormality of gait  Unsteadiness     Problem List Patient Active Problem List   Diagnosis Date Noted  . Rosacea   . Gait disturbance, post-stroke  10/05/2015  .  Presbycusis of both ears   . Advanced care planning/counseling discussion 06/23/2015  . Impaired mobility 04/23/2015  . HTN (hypertension) 03/18/2015  . Chronic diastolic heart failure, NYHA class 1 ([place]) 03/18/2015  . Spastic hemiparesis affecting dominant side ([place]) 07/07/2014  . Chest pressure 02/03/2014  . Medicare annual wellness visit, subsequent 11/06/2013  . Erectile dysfunction due to arterial insufficiency 02/05/2013  . MDD (major depressive disorder), recurrent episode, moderate ([place])   . Palpitations 06/26/2012  . HLD (hyperlipidemia)   . Restless leg syndrome   . GERD (gastroesophageal reflux disease)   . BPH with obstruction/lower urinary tract symptoms   . History of CVA (cerebrovascular accident) 03/11/2012  . Hemiparesis affecting right side as late effect of stroke ([place]) 03/11/2012  . Type 2 diabetes, controlled, with neuropathy ([place]) 03/11/2012  . OSA on CPAP 03/11/2012  . CKD (chronic kidney disease) stage 3, GFR 30-59 ml/min (HCC) 03/11/2012  . Migraines 03/11/2012    Alanson Puls, PT DPT 09/26/2017, 10:33 AM  [place] MAIN [facility] SERVICES [place], [name], [address] Phone: [phone]   Fax:  [phone]  Name: James BALAN, James Bell MRN: [id number] Date of Birth: 12-02-1930

## 2017-09-26 NOTE — Patient Instructions (Signed)
HIP: Extension, Bridging Bilateral    Place feet on surface. Tighten glutes, raise hips up. _10__ reps per set, __1_ sets per day, _7__ days per week   Copyright  VHI. All rights reserved.  HIP: Abduction - Side-Lying (Band)    Place band around legs. Squeeze glutes. Raise top leg up and slightly back. Hold __3_ seconds. Use ____no____ band. _10__ reps per set, __1_ sets per day, __7_ days per week Bend bottom leg to stabilize pelvis.  Copyright  VHI. All rights reserved.

## 2017-10-01 ENCOUNTER — Encounter: Payer: Self-pay | Admitting: Physical Therapy

## 2017-10-01 ENCOUNTER — Ambulatory Visit: Payer: Medicare Other | Admitting: Physical Therapy

## 2017-10-01 DIAGNOSIS — R269 Unspecified abnormalities of gait and mobility: Secondary | ICD-10-CM | POA: Diagnosis not present

## 2017-10-01 DIAGNOSIS — R2681 Unsteadiness on feet: Secondary | ICD-10-CM | POA: Diagnosis not present

## 2017-10-01 DIAGNOSIS — M6281 Muscle weakness (generalized): Secondary | ICD-10-CM

## 2017-10-01 NOTE — Therapy (Signed)
[place] MAIN [facility] SERVICES [address] [place], [name], [address] Phone: [phone]   Fax:  [phone]  Physical Therapy Treatment  Patient Details  Name: James BENDICKSON, MD MRN: [id number] Date of Birth: Jun 13, 1931 Referring Provider: Charlett Blake    Encounter Date: 10/01/2017  PT End of Session - 10/01/17 0842    Visit Number  2    Number of Visits  25    Date for PT Re-Evaluation  12/19/17    PT Start Time  0830    PT Stop Time  0915    PT Time Calculation (min)  45 min    Equipment Utilized During Treatment  Gait belt;Back brace    Activity Tolerance  Patient tolerated treatment well;Patient limited by fatigue    Behavior During Therapy  [facility] for tasks assessed/performed       Past Medical History:  Diagnosis Date  . Anemia   . Basal cell carcinoma 2018   R temple   . Basal cell carcinoma of nose 2002   bridge of nose  . BPH with obstruction/lower urinary tract symptoms    failed flomax, uroxatral, myrbetriq - sees urology [name]) pending videourodynamics  . CKD (chronic kidney disease) stage 3, GFR 30-59 ml/min (HCC) 03/11/2012   Renal US 02/2014 - multiple kidney cysts   . CVA (cerebral infarction) 03/07/12   slurred speech; right hemplegia, left handed - completed PT 07/2014 (17 sessions)  . Depression    mild, on cymbalta per prior PCP  . Erectile dysfunction    per urology (prostaglandin injection)  . GERD (gastroesophageal reflux disease)    with sliding HH  . H/O hiatal hernia   . History of kidney problems 12/2010   lacerated left kidney after fall  . HLD (hyperlipidemia)   . Kidney cysts 02/2014   bilateral (renal US)  . Migraines 03/11/12   now rare  . OSA on CPAP    sleep study 01/2014 - severe, CPAP at 10, previously on nuvigil (Dr. [name])  . Palpitations 01/2012   holter WNL, rare PAC/PVCs  . Presbycusis of both ears 2016   rec amplification [facility])  . Restless leg syndrome    well controlled on mirapex  . Rosacea    [place]  . TIA (transient ischemic attack)   . Type 2 diabetes, controlled, with neuropathy [facility]) 2011   [name] in [facility]    Past Surgical History:  Procedure Laterality Date  . ABI  2007   WNL  . CARDIAC CATHETERIZATION  2004   normal; [place] [facility]  . CARDIOVASCULAR STRESS TEST  02/2014   WNL, low risk scan, EF 68%  . CAROTID ENDARTERECTOMY    . CATARACT EXTRACTION W/ INTRAOCULAR LENS IMPLANT  11//2012 - 08/2011  . COLONOSCOPY  03/2011   poor prep, unable to biopsy polyp in tic fold, rpt 3 mo [name])  . COLONOSCOPY  06/2011   mult polyps adenomatous, severe diverticulosis, rpt 3 yrs [name])  . CYSTOSCOPY WITH URETHRAL DILATATION N/A 07/24/2013   Procedure: CYSTOSCOPY WITH URETHRAL DILATATION;  Surgeon: [name], MD;  Location: [facility];  Service: Urology;  Laterality: N/A;  . ESOPHAGOGASTRODUODENOSCOPY  2006   duodenitis, medual HH, Grade 2 reflux, mild gastritis  . ESOPHAGOGASTRODUODENOSCOPY  07/2011   mild chronic gastritis  . [place]  . [place]; 2002   left, then repaired  . MRI brain  04/2010   WNL  . PROSTATE SURGERY  02/2009   PVP (laser)  . SKIN CANCER EXCISION  2002   bridge of nose, BCC  . TONSILLECTOMY AND ADENOIDECTOMY  ~ 1945  . TRANSURETHRAL INCISION OF PROSTATE N/A 07/24/2013   Procedure: TRANSURETHRAL INCISION OF THE PROSTATE (TUIP);  Surgeon: [name], MD;  Location: [facility];  Service: Urology;  Laterality: N/A;  . URETHRAL DILATION  2014   tannenbaum    There were no vitals filed for this visit.  Subjective Assessment - 10/01/17 0837    Subjective  Patient is concerned that he was pretty fatigued after his evaluation last session. He did have a long way to walk in and out of the building to get to the appointment.  Today he does not have any new concerns.     Pertinent History  Patinet had CVA March 11 2012. He had rehab with hospital and SNF and then outpatient  therapy. He wears a lumbar corset to decrease his back pain if he is standing.     How long can you sit comfortably?  unlimited    How long can you stand comfortably?  10 or 15 mins    How long can you walk comfortably?  15 mins using a rollator outdoors.    Patient Stated Goals  Patient wants to be able to stand for longer periods and walk for longer periods.    Currently in Pain?  No/denies    Multiple Pain Sites  No       Treatment: Nu-step ( warm up) TM at 1.2 miles / hour x 10 mins with cues to keep RLE ankle neutral and not let it ER Standing lunges in parallel bars on BOSU ball x 10 with UE RLE on BOSU ball Standing lunges in parallel bars on BOSU ball x 10 without  UE with RLE on BOSU ball Standing lunges in parallel bars on BOSU ball x 10 without  UE with LLE on BOSU ball  Standing left knee extension with GTB ,eccentric terminal knee extension x 20 reps Standing squats x 10 with 5 sec hold Patient demonstrated is home rollator device that folds up for his walking program  Patient needs cues for posture and technique                       PT Education - 10/01/17 0842    Education provided  Yes    Education Details  HEP    Person(s) Educated  Patient    Methods  Explanation    Comprehension  Verbalized understanding       PT Short Term Goals - 09/26/17 1030      PT SHORT TERM GOAL #1   Title  Patient will ascend and descend step without railing and use of hemiwalker without loss of balance to be able to safely get into his house.    Time  6    Period  Weeks    Status  New    Target Date  11/07/17        PT Long Term Goals - 09/26/17 1026      PT LONG TERM GOAL #1   Title  Patient will be able to stand for 1 hour without increase in pain to demonstrate improved tolerance for ADLs.     Baseline  10-15 mins with lumbar corset    Time  12    Period  Weeks    Status  New    Target Date  12/19/17  PT LONG TERM GOAL #2   Title  Patient  will reduce timed up and go to <11 seconds to reduce fall risk and demonstrate improved transfer/gait ability.    Time  12    Period  Weeks    Status  New      PT LONG TERM GOAL #3   Title  Patient (> 65 years old) will complete five times sit to stand test in < 15 seconds indicating an increased LE strength and improved balance.    Time  12    Period  Weeks    Status  New    Target Date  12/19/17      PT LONG TERM GOAL #4   Title  Patient will increase six minute walk test distance to >600 for progression to community ambulator and improve gait ability    Baseline  474 ft    Time  12    Period  Weeks    Status  New            Plan - 10/01/17 9983    Clinical Impression Statement  Pt requires direction and verbal cues for correct performance of exercises. Patient has fatigue with RLE foot clearance during swing phase and needs cues to pick up RLE.   Patient struggles with speed during movement as well as balance with unstable surfaces. Pt encouraged to continue HEP  Patient will benefit from continued skilled PT to improve mobility and safety.    Rehab Potential  Good    PT Frequency  2x / week    PT Duration  12 weeks    PT Treatment/Interventions  Dry needling;Manual techniques;Balance training;Neuromuscular re-education;Patient/family education;Therapeutic exercise;Therapeutic activities;Functional mobility training;Stair training;Gait training;Moist Heat;Cryotherapy;Electrical Stimulation    PT Next Visit Plan  add to HEP, balance training    Consulted and Agree with Plan of Care  Patient       Patient will benefit from skilled therapeutic intervention in order to improve the following deficits and impairments:  Abnormal gait, Decreased balance, Decreased endurance, Decreased mobility, Difficulty walking, Impaired sensation, Impaired tone, Decreased activity tolerance, Decreased strength, Decreased safety awareness, Pain  Visit Diagnosis: Muscle weakness  Abnormality of  gait  Unsteadiness     Problem List Patient Active Problem List   Diagnosis Date Noted  . Rosacea   . Gait disturbance, post-stroke 10/05/2015  . Presbycusis of both ears   . Advanced care planning/counseling discussion 06/23/2015  . Impaired mobility 04/23/2015  . HTN (hypertension) 03/18/2015  . Chronic diastolic heart failure, NYHA class 1 ([place]) 03/18/2015  . Spastic hemiparesis affecting dominant side ([place]) 07/07/2014  . Chest pressure 02/03/2014  . Medicare annual wellness visit, subsequent 11/06/2013  . Erectile dysfunction due to arterial insufficiency 02/05/2013  . MDD (major depressive disorder), recurrent episode, moderate ([place])   . Palpitations 06/26/2012  . HLD (hyperlipidemia)   . Restless leg syndrome   . GERD (gastroesophageal reflux disease)   . BPH with obstruction/lower urinary tract symptoms   . History of CVA (cerebrovascular accident) 03/11/2012  . Hemiparesis affecting right side as late effect of stroke ([place]) 03/11/2012  . Type 2 diabetes, controlled, with neuropathy ([place]) 03/11/2012  . OSA on CPAP 03/11/2012  . CKD (chronic kidney disease) stage 3, GFR 30-59 ml/min (HCC) 03/11/2012  . Migraines 03/11/2012    Alanson Puls, PT DPT 10/01/2017, 9:28 AM  [place] MAIN [facility] SERVICES [address] [place], [name], [address] Phone: [phone]   Fax:  [phone]  Name: TAUHEED MCFAYDEN, MD MRN: [id number] Date of Birth: 1931/07/28

## 2017-10-03 ENCOUNTER — Ambulatory Visit: Payer: Medicare Other | Admitting: Physical Therapy

## 2017-10-03 ENCOUNTER — Encounter: Payer: Self-pay | Admitting: Physical Therapy

## 2017-10-03 DIAGNOSIS — R2681 Unsteadiness on feet: Secondary | ICD-10-CM | POA: Diagnosis not present

## 2017-10-03 DIAGNOSIS — M6281 Muscle weakness (generalized): Secondary | ICD-10-CM

## 2017-10-03 DIAGNOSIS — R269 Unspecified abnormalities of gait and mobility: Secondary | ICD-10-CM

## 2017-10-03 NOTE — Therapy (Signed)
[place] MAIN [facility] SERVICES [address] [place], [name], [address] Phone: [phone]   Fax:  [phone]  Physical Therapy Treatment  Patient Details  Name: James SHIMABUKURO, MD MRN: [id number] Date of Birth: March 31, 1931 Referring Provider: Charlett Blake    Encounter Date: 10/03/2017  PT End of Session - 10/03/17 0915    Visit Number  2    Number of Visits  25    Date for PT Re-Evaluation  12/19/17    PT Start Time  0850    PT Stop Time  0930    PT Time Calculation (min)  40 min    Equipment Utilized During Treatment  Gait belt;Back brace    Activity Tolerance  Patient tolerated treatment well;Patient limited by fatigue    Behavior During Therapy  [facility] for tasks assessed/performed       Past Medical History:  Diagnosis Date  . Anemia   . Basal cell carcinoma 2018   R temple   . Basal cell carcinoma of nose 2002   bridge of nose  . BPH with obstruction/lower urinary tract symptoms    failed flomax, uroxatral, myrbetriq - sees urology [name]) pending videourodynamics  . CKD (chronic kidney disease) stage 3, GFR 30-59 ml/min (HCC) 03/11/2012   Renal US 02/2014 - multiple kidney cysts   . CVA (cerebral infarction) 03/07/12   slurred speech; right hemplegia, left handed - completed PT 07/2014 (17 sessions)  . Depression    mild, on cymbalta per prior PCP  . Erectile dysfunction    per urology (prostaglandin injection)  . GERD (gastroesophageal reflux disease)    with sliding HH  . H/O hiatal hernia   . History of kidney problems 12/2010   lacerated left kidney after fall  . HLD (hyperlipidemia)   . Kidney cysts 02/2014   bilateral (renal US)  . Migraines 03/11/12   now rare  . OSA on CPAP    sleep study 01/2014 - severe, CPAP at 10, previously on nuvigil (Dr. [name])  . Palpitations 01/2012   holter WNL, rare PAC/PVCs  . Presbycusis of both ears 2016   rec amplification [facility])  . Restless leg syndrome    well controlled on mirapex  . Rosacea    [place]  . TIA (transient ischemic attack)   . Type 2 diabetes, controlled, with neuropathy [facility]) 2011   [name] in [facility]    Past Surgical History:  Procedure Laterality Date  . ABI  2007   WNL  . CARDIAC CATHETERIZATION  2004   normal; [place]  . CARDIOVASCULAR STRESS TEST  02/2014   WNL, low risk scan, EF 68%  . CAROTID ENDARTERECTOMY    . CATARACT EXTRACTION W/ INTRAOCULAR LENS IMPLANT  11//2012 - 08/2011  . COLONOSCOPY  03/2011   poor prep, unable to biopsy polyp in tic fold, rpt 3 mo [name])  . COLONOSCOPY  06/2011   mult polyps adenomatous, severe diverticulosis, rpt 3 yrs [name])  . CYSTOSCOPY WITH URETHRAL DILATATION N/A 07/24/2013   Procedure: CYSTOSCOPY WITH URETHRAL DILATATION;  Surgeon: [name], MD;  [facility];  Service: Urology;  Laterality: N/A;  . ESOPHAGOGASTRODUODENOSCOPY  2006   duodenitis, medual HH, Grade 2 reflux, mild gastritis  . ESOPHAGOGASTRODUODENOSCOPY  07/2011   mild chronic gastritis  . [place]  . [place]; 2002   left, then repaired  . MRI brain  04/2010   WNL  . PROSTATE SURGERY  02/2009   PVP (laser)  . SKIN CANCER EXCISION  2002   bridge of nose, BCC  . TONSILLECTOMY AND ADENOIDECTOMY  ~ 1945  . TRANSURETHRAL INCISION OF PROSTATE N/A 07/24/2013   Procedure: TRANSURETHRAL INCISION OF THE PROSTATE (TUIP);  Surgeon: [name], MD;  Location: [facility];  Service: Urology;  Laterality: N/A;  . URETHRAL DILATION  2014   tannenbaum    There were no vitals filed for this visit.  Subjective Assessment - 10/03/17 0913    Subjective  Patient did better as far as fatigue after his last treatment. He has now new concerns and is doing well today.     Pertinent History  Patinet had CVA March 11 2012. He had rehab with hospital and SNF and then outpatient therapy. He wears a lumbar corset to decrease his back pain if he is standing.     How long can  you sit comfortably?  unlimited    How long can you stand comfortably?  10 or 15 mins    How long can you walk comfortably?  15 mins using a rollator outdoors.    Patient Stated Goals  Patient wants to be able to stand for longer periods and walk for longer periods.    Currently in Pain?  No/denies    Multiple Pain Sites  No       Treatment:  Tm walking 1. 2 miles / hour with elevation of 1 x 10 mins with UE support  Leg press 90 lbs x 20 x 3 sets   Leg press 90 lbs , knees extended and heel raises x 20 x 2  Tandem stand x 30 sec x 3 attempts and alternating LE for the fwd foot, CGA  Feet together and head turns on flat surface with no UE support and LOB ; CGA  Lunges on BOSU ball x 10 BLE , 1 UE support in parallel bars   Added balance in standing in a corner to HEP with feet side by side and head turns.      CGA and Min to mod verbal cues used throughout with increased in postural sway and LOB most seen with narrow base of support and while on even surfaces. Continues to have balance deficits typical with diagnosis. Patient performs intermediate level exercises  and needs verbal cuing for postural alignment and head positioning                 PT Education - 10/03/17 0914    Education provided  Yes    Education Details  exercise technique    Person(s) Educated  Patient    Methods  Explanation    Comprehension  Verbalized understanding;Returned demonstration       PT Short Term Goals - 09/26/17 1030      PT SHORT TERM GOAL #1   Title  Patient will ascend and descend step without railing and use of hemiwalker without loss of balance to be able to safely get into his house.    Time  6    Period  Weeks    Status  New    Target Date  11/07/17        PT Long Term Goals - 09/26/17 1026      PT LONG TERM GOAL #1   Title  Patient will be able to stand for 1 hour without increase in pain to demonstrate improved tolerance for ADLs.     Baseline  10-15 mins  with lumbar corset  Time  12    Period  Weeks    Status  New    Target Date  12/19/17      PT LONG TERM GOAL #2   Title  Patient will reduce timed up and go to <11 seconds to reduce fall risk and demonstrate improved transfer/gait ability.    Time  12    Period  Weeks    Status  New      PT LONG TERM GOAL #3   Title  Patient (> 37 years old) will complete five times sit to stand test in < 15 seconds indicating an increased LE strength and improved balance.    Time  12    Period  Weeks    Status  New    Target Date  12/19/17      PT LONG TERM GOAL #4   Title  Patient will increase six minute walk test distance to >600 for progression to community ambulator and improve gait ability    Baseline  474 ft    Time  12    Period  Weeks    Status  New            Plan - 10/03/17 0915    Clinical Impression Statement  Pt was able to progress through balance and strength exercises today with no  pain during standing activities Pt continues to demonstrate improvement with dynamic and static balance, with decreased LOB noted with dynamic activities on even and uneven surfaces.  Pt has decreased single leg stability.  Pt would continue to benefit from skilled therapy services in order to continue strengthening B LE's and improving dynamic and static balance, especially single limb stability.    Rehab Potential  Good    PT Frequency  2x / week    PT Duration  12 weeks    PT Treatment/Interventions  Dry needling;Manual techniques;Balance training;Neuromuscular re-education;Patient/family education;Therapeutic exercise;Therapeutic activities;Functional mobility training;Stair training;Gait training;Moist Heat;Cryotherapy;Electrical Stimulation    PT Next Visit Plan  add to HEP, balance training    Consulted and Agree with Plan of Care  Patient       Patient will benefit from skilled therapeutic intervention in order to improve the following deficits and impairments:  Abnormal gait,  Decreased balance, Decreased endurance, Decreased mobility, Difficulty walking, Impaired sensation, Impaired tone, Decreased activity tolerance, Decreased strength, Decreased safety awareness, Pain  Visit Diagnosis: Muscle weakness  Abnormality of gait  Unsteadiness     Problem List Patient Active Problem List   Diagnosis Date Noted  . Rosacea   . Gait disturbance, post-stroke 10/05/2015  . Presbycusis of both ears   . Advanced care planning/counseling discussion 06/23/2015  . Impaired mobility 04/23/2015  . HTN (hypertension) 03/18/2015  . Chronic diastolic heart failure, NYHA class 1 ([place]) 03/18/2015  . Spastic hemiparesis affecting dominant side ([place]) 07/07/2014  . Chest pressure 02/03/2014  . Medicare annual wellness visit, subsequent 11/06/2013  . Erectile dysfunction due to arterial insufficiency 02/05/2013  . MDD (major depressive disorder), recurrent episode, moderate ([place])   . Palpitations 06/26/2012  . HLD (hyperlipidemia)   . Restless leg syndrome   . GERD (gastroesophageal reflux disease)   . BPH with obstruction/lower urinary tract symptoms   . History of CVA (cerebrovascular accident) 03/11/2012  . Hemiparesis affecting right side as late effect of stroke ([place]) 03/11/2012  . Type 2 diabetes, controlled, with neuropathy ([place]) 03/11/2012  . OSA on CPAP 03/11/2012  . CKD (chronic kidney disease) stage 3, GFR 30-59 ml/min (HCC)  03/11/2012  . Migraines 03/11/2012    Alanson Puls, PT DPT 10/03/2017, 9:18 AM  [place] MAIN [facility] SERVICES [place], [name], [address] Phone: [phone]   Fax:  [phone]  Name: MACKLEN WILHOITE, MD MRN: [id number] Date of Birth: [birth year]

## 2017-10-08 ENCOUNTER — Ambulatory Visit: Payer: Medicare Other | Admitting: Physical Therapy

## 2017-10-10 ENCOUNTER — Ambulatory Visit: Payer: Medicare Other | Admitting: Physical Therapy

## 2017-10-11 ENCOUNTER — Encounter: Payer: Self-pay | Admitting: Physical Therapy

## 2017-10-11 ENCOUNTER — Ambulatory Visit: Payer: Medicare Other | Admitting: Physical Therapy

## 2017-10-11 DIAGNOSIS — R269 Unspecified abnormalities of gait and mobility: Secondary | ICD-10-CM

## 2017-10-11 DIAGNOSIS — R2681 Unsteadiness on feet: Secondary | ICD-10-CM

## 2017-10-11 DIAGNOSIS — M6281 Muscle weakness (generalized): Secondary | ICD-10-CM | POA: Diagnosis not present

## 2017-10-11 NOTE — Therapy (Signed)
[place] MAIN [name] SERVICES [address] [place], [name], [address] Phone: [phone]   Fax:  [phone]  Physical Therapy Treatment  Patient Details  Name: James BANET, James Bell MRN: [id number] Date of Birth: 31-Oct-1930 Referring Provider: Charlett Blake    Encounter Date: 10/11/2017  PT End of Session - 10/11/17 1104    Visit Number  3    Number of Visits  25    Date for PT Re-Evaluation  12/19/17    PT Start Time  1100    PT Stop Time  1145    PT Time Calculation (min)  45 min    Equipment Utilized During Treatment  Gait belt;Back brace    Activity Tolerance  Patient tolerated treatment well;Patient limited by fatigue    Behavior During Therapy  [facility] for tasks assessed/performed       Past Medical History:  Diagnosis Date  . Anemia   . Basal cell carcinoma 2018   R temple   . Basal cell carcinoma of nose 2002   bridge of nose  . BPH with obstruction/lower urinary tract symptoms    failed flomax, uroxatral, myrbetriq - sees urology [name]) pending videourodynamics  . CKD (chronic kidney disease) stage 3, GFR 30-59 ml/min (HCC) 03/11/2012   Renal US 02/2014 - multiple kidney cysts   . CVA (cerebral infarction) 03/07/12   slurred speech; right hemplegia, left handed - completed PT 07/2014 (17 sessions)  . Depression    mild, on cymbalta per prior PCP  . Erectile dysfunction    per urology (prostaglandin injection)  . GERD (gastroesophageal reflux disease)    with sliding HH  . H/O hiatal hernia   . History of kidney problems 12/2010   lacerated left kidney after fall  . HLD (hyperlipidemia)   . Kidney cysts 02/2014   bilateral (renal US)  . Migraines 03/11/12   now rare  . OSA on CPAP    sleep study 01/2014 - severe, CPAP at 10, previously on nuvigil (Dr. [name])  . Palpitations 01/2012   holter WNL, rare PAC/PVCs  . Presbycusis of both ears 2016   rec amplification [facility])  . Restless leg syndrome    well controlled on mirapex  . Rosacea    Kowalski  . TIA (transient ischemic attack)   . Type 2 diabetes, controlled, with neuropathy [facility]) 2011   [name] in [facility]    Past Surgical History:  Procedure Laterality Date  . ABI  2007   WNL  . CARDIAC CATHETERIZATION  2004   normal; [facility]  . CARDIOVASCULAR STRESS TEST  02/2014   WNL, low risk scan, EF 68%  . CAROTID ENDARTERECTOMY    . CATARACT EXTRACTION W/ INTRAOCULAR LENS IMPLANT  11//2012 - 08/2011  . COLONOSCOPY  03/2011   poor prep, unable to biopsy polyp in tic fold, rpt 3 mo [name])  . COLONOSCOPY  06/2011   mult polyps adenomatous, severe diverticulosis, rpt 3 yrs [name])  . CYSTOSCOPY WITH URETHRAL DILATATION N/A 07/24/2013   Procedure: CYSTOSCOPY WITH URETHRAL DILATATION;  Surgeon: [name], James Bell;  Location: [facility];  Service: Urology;  Laterality: N/A;  . ESOPHAGOGASTRODUODENOSCOPY  2006   duodenitis, medual HH, Grade 2 reflux, mild gastritis  . ESOPHAGOGASTRODUODENOSCOPY  07/2011   mild chronic gastritis  . [place]  . [place]; 2002   left, then repaired  . MRI brain  04/2010   WNL  . PROSTATE SURGERY  02/2009   PVP (laser)  . SKIN CANCER EXCISION  2002   bridge of nose, BCC  . TONSILLECTOMY AND ADENOIDECTOMY  ~ 1945  . TRANSURETHRAL INCISION OF PROSTATE N/A 07/24/2013   Procedure: TRANSURETHRAL INCISION OF THE PROSTATE (TUIP);  Surgeon: [name], James Bell;  Location: [facility];  Service: Urology;  Laterality: N/A;  . URETHRAL DILATION  2014   tannenbaum    There were no vitals filed for this visit.  Subjective Assessment - 10/11/17 1103    Subjective  Patient did better as far as fatigue after his last treatment. He has now new concerns and is doing well today.     Pertinent History  Patinet had CVA March 11 2012. He had rehab with hospital and SNF and then outpatient therapy. He wears a lumbar corset to decrease his back pain if he is standing.     How long can  you sit comfortably?  unlimited    How long can you stand comfortably?  10 or 15 mins    How long can you walk comfortably?  15 mins using a rollator outdoors.    Patient Stated Goals  Patient wants to be able to stand for longer periods and walk for longer periods.    Currently in Pain?  No/denies    Multiple Pain Sites  No       Therapeutic exercise:  Nu-step x 5 mins ( warm up)  Standing step ups on 6 inch stool x 15  Standing toe taps x 15 without UE and min assist  Matrix x 2 1/2 reps backward /fwd   of 20 feet  with 12. 5 lbs and min assist  Standing  Side step ups left and right to 6 inch stool x 5 repetitions  Patient needs cues for posture and technique, he is fatigued after treatment today.                        PT Education - 10/11/17 1104    Education provided  Yes    Education Details  safety with gait    Person(s) Educated  Patient    Methods  Explanation    Comprehension  Verbalized understanding       PT Short Term Goals - 09/26/17 1030      PT SHORT TERM GOAL #1   Title  Patient will ascend and descend step without railing and use of hemiwalker without loss of balance to be able to safely get into his house.    Time  6    Period  Weeks    Status  New    Target Date  11/07/17        PT Long Term Goals - 09/26/17 1026      PT LONG TERM GOAL #1   Title  Patient will be able to stand for 1 hour without increase in pain to demonstrate improved tolerance for ADLs.     Baseline  10-15 mins with lumbar corset    Time  12    Period  Weeks    Status  New    Target Date  12/19/17      PT LONG TERM GOAL #2   Title  Patient will reduce timed up and go to <11 seconds to reduce fall risk and demonstrate improved transfer/gait ability.    Time  12    Period  Weeks    Status  New      PT LONG  TERM GOAL #3   Title  Patient (> 61 years old) will complete five times sit to stand test in < 15 seconds indicating an increased LE strength  and improved balance.    Time  12    Period  Weeks    Status  New    Target Date  12/19/17      PT LONG TERM GOAL #4   Title  Patient will increase six minute walk test distance to >600 for progression to community ambulator and improve gait ability    Baseline  474 ft    Time  12    Period  Weeks    Status  New            Plan - 10/11/17 1105    Clinical Impression Statement  Patient required min verbal cueing during matrix machine stepping, and required CGA during all dynamic standing balance activities. Patient required occasional rest breaks between exercises due to fatigue. Patient tolerated exercise well. Patient will continue to benefit from skilled therapy in order to improve dynamic standing balance activities and increase gait speed to reduce risk for falls    Rehab Potential  Good    PT Frequency  2x / week    PT Duration  12 weeks    PT Treatment/Interventions  Dry needling;Manual techniques;Balance training;Neuromuscular re-education;Patient/family education;Therapeutic exercise;Therapeutic activities;Functional mobility training;Stair training;Gait training;Moist Heat;Cryotherapy;Electrical Stimulation    PT Next Visit Plan  add to HEP, balance training    Consulted and Agree with Plan of Care  Patient       Patient will benefit from skilled therapeutic intervention in order to improve the following deficits and impairments:  Abnormal gait, Decreased balance, Decreased endurance, Decreased mobility, Difficulty walking, Impaired sensation, Impaired tone, Decreased activity tolerance, Decreased strength, Decreased safety awareness, Pain  Visit Diagnosis: Muscle weakness  Abnormality of gait  Unsteadiness     Problem List Patient Active Problem List   Diagnosis Date Noted  . Rosacea   . Gait disturbance, post-stroke 10/05/2015  . Presbycusis of both ears   . Advanced care planning/counseling discussion 06/23/2015  . Impaired mobility 04/23/2015  . HTN  (hypertension) 03/18/2015  . Chronic diastolic heart failure, NYHA class 1 ([place]) 03/18/2015  . Spastic hemiparesis affecting dominant side ([place]) 07/07/2014  . Chest pressure 02/03/2014  . Medicare annual wellness visit, subsequent 11/06/2013  . Erectile dysfunction due to arterial insufficiency 02/05/2013  . MDD (major depressive disorder), recurrent episode, moderate ([place])   . Palpitations 06/26/2012  . HLD (hyperlipidemia)   . Restless leg syndrome   . GERD (gastroesophageal reflux disease)   . BPH with obstruction/lower urinary tract symptoms   . History of CVA (cerebrovascular accident) 03/11/2012  . Hemiparesis affecting right side as late effect of stroke ([place]) 03/11/2012  . Type 2 diabetes, controlled, with neuropathy ([place]) 03/11/2012  . OSA on CPAP 03/11/2012  . CKD (chronic kidney disease) stage 3, GFR 30-59 ml/min (HCC) 03/11/2012  . Migraines 03/11/2012    Alanson Puls, PT DPT 10/11/2017, 11:09 AM  [place] MAIN [facility] SERVICES [place], [name], [address] Phone: [phone]   Fax:  [phone]  Name: James BEGGS, James Bell MRN: [id number] Date of Birth: 1931-08-28

## 2017-10-15 ENCOUNTER — Encounter: Payer: Self-pay | Admitting: Physical Therapy

## 2017-10-15 ENCOUNTER — Ambulatory Visit: Payer: Medicare Other | Admitting: Physical Therapy

## 2017-10-15 DIAGNOSIS — M6281 Muscle weakness (generalized): Secondary | ICD-10-CM

## 2017-10-15 DIAGNOSIS — R269 Unspecified abnormalities of gait and mobility: Secondary | ICD-10-CM

## 2017-10-15 DIAGNOSIS — R2681 Unsteadiness on feet: Secondary | ICD-10-CM | POA: Diagnosis not present

## 2017-10-15 NOTE — Therapy (Addendum)
[place] MAIN [facility] SERVICES [address] [place], [name], [address] Phone: [phone]   Fax:  [phone]  Physical Therapy Treatment  Patient Details  Name: James TONKINSON, James Bell MRN: [id number] Date of Birth: [birth year] Referring Provider: Charlett Blake    Encounter Date: 10/15/2017  PT End of Session - 10/15/17 0853    Visit Number  4    Number of Visits  25    Date for PT Re-Evaluation  12/19/17    PT Start Time  0845    PT Stop Time  0930    PT Time Calculation (min)  45 min    Equipment Utilized During Treatment  Gait belt;Back brace    Activity Tolerance  Patient tolerated treatment well;Patient limited by fatigue    Behavior During Therapy  [facility] for tasks assessed/performed       Past Medical History:  Diagnosis Date  . Anemia   . Basal cell carcinoma 2018   R temple   . Basal cell carcinoma of nose 2002   bridge of nose  . BPH with obstruction/lower urinary tract symptoms    failed flomax, uroxatral, myrbetriq - sees urology [name]) pending videourodynamics  . CKD (chronic kidney disease) stage 3, GFR 30-59 ml/min (HCC) 03/11/2012   Renal US 02/2014 - multiple kidney cysts   . CVA (cerebral infarction) 03/07/12   slurred speech; right hemplegia, left handed - completed PT 07/2014 (17 sessions)  . Depression    mild, on cymbalta per prior PCP  . Erectile dysfunction    per urology (prostaglandin injection)  . GERD (gastroesophageal reflux disease)    with sliding HH  . H/O hiatal hernia   . History of kidney problems 12/2010   lacerated left kidney after fall  . HLD (hyperlipidemia)   . Kidney cysts 02/2014   bilateral (renal US)  . Migraines 03/11/12   now rare  . OSA on CPAP    sleep study 01/2014 - severe, CPAP at 10, previously on nuvigil (Dr. [name])  . Palpitations 01/2012   holter WNL, rare PAC/PVCs  . Presbycusis of both ears 2016   rec amplification [facility])  . Restless leg syndrome     well controlled on mirapex  . Rosacea    Kowalski  . TIA (transient ischemic attack)   . Type 2 diabetes, controlled, with neuropathy [facility]) 2011   [name] in [facility]    Past Surgical History:  Procedure Laterality Date  . ABI  2007   WNL  . CARDIAC CATHETERIZATION  2004   normal; [facility]  . CARDIOVASCULAR STRESS TEST  02/2014   WNL, low risk scan, EF 68%  . CAROTID ENDARTERECTOMY    . CATARACT EXTRACTION W/ INTRAOCULAR LENS IMPLANT  11//2012 - 08/2011  . COLONOSCOPY  03/2011   poor prep, unable to biopsy polyp in tic fold, rpt 3 mo [name])  . COLONOSCOPY  06/2011   mult polyps adenomatous, severe diverticulosis, rpt 3 yrs [name])  . CYSTOSCOPY WITH URETHRAL DILATATION N/A 07/24/2013   Procedure: CYSTOSCOPY WITH URETHRAL DILATATION;  Surgeon: [name], James Bell;  [facility];  Service: Urology;  Laterality: N/A;  . ESOPHAGOGASTRODUODENOSCOPY  2006   duodenitis, medual HH, Grade 2 reflux, mild gastritis  . ESOPHAGOGASTRODUODENOSCOPY  07/2011   mild chronic gastritis  . [place]  . [place]; 2002   left, then repaired  . MRI brain  04/2010   WNL  . PROSTATE  SURGERY  02/2009   PVP (laser)  . SKIN CANCER EXCISION  2002   bridge of nose, BCC  . TONSILLECTOMY AND ADENOIDECTOMY  ~ 1945  . TRANSURETHRAL INCISION OF PROSTATE N/A 07/24/2013   Procedure: TRANSURETHRAL INCISION OF THE PROSTATE (TUIP);  Surgeon: [name], James Bell;  [facility];  Service: Urology;  Laterality: N/A;  . URETHRAL DILATION  2014   tannenbaum    There were no vitals filed for this visit.  Subjective Assessment - 10/15/17 0851    Subjective  Patient is doing better today. He has no new concerns and is doing well today.     Pertinent History  Patinet had CVA March 11 2012. He had rehab with hospital and SNF and then outpatient therapy. He wears a lumbar corset to decrease his back pain if he is standing.     How long can you sit comfortably?  unlimited     How long can you stand comfortably?  10 or 15 mins    How long can you walk comfortably?  15 mins using a rollator outdoors.    Patient Stated Goals  Patient wants to be able to stand for longer periods and walk for longer periods.    Currently in Pain?  No/denies    Multiple Pain Sites  No       Treatment: Nu-step ( warm up) TM at 1.2 miles / hour x 10 mins with cues to keep RLE ankle neutral and not let it ER, cues to improve RLE ankle motor control  Standing on blue foam feet apart and head turns, ; patient performs slowly with postural sway, cues for better positioning  Standing on blue foam feet together, ; patient performs slowly with postural sway Standing on 1/2 foam feet apart ; patient performs slowly with postural sway Rocker board fwd/ bwd with vc for shifting weight over LE's  Standing left knee extension with GTB ,eccentric terminal knee extension x 20 reps, cues for better leg control Standing squats x 10 with 5 sec hold, cues for posture and positioning change and slowing down movements  Patient needs cues for posture and technique; Patient is not comfortable with weight shifting over his right LE and has knee flexion and right lean with weight shift.                        PT Education - 10/15/17 0853    Education provided  Yes    Education Details  Reviewed HEP    Person(s) Educated  Patient    Methods  Explanation    Comprehension  Verbalized understanding       PT Short Term Goals - 09/26/17 1030      PT SHORT TERM GOAL #1   Title  Patient will ascend and descend step without railing and use of hemiwalker without loss of balance to be able to safely get into his house.    Time  6    Period  Weeks    Status  New    Target Date  11/07/17        PT Long Term Goals - 09/26/17 1026      PT LONG TERM GOAL #1   Title  Patient will be able to stand for 1 hour without increase in pain to demonstrate improved tolerance for ADLs.      Baseline  10-15 mins with lumbar corset    Time  12    Period  Weeks    Status  New    Target Date  12/19/17      PT LONG TERM GOAL #2   Title  Patient will reduce timed up and go to <11 seconds to reduce fall risk and demonstrate improved transfer/gait ability.    Time  12    Period  Weeks    Status  New      PT LONG TERM GOAL #3   Title  Patient (> 61 years old) will complete five times sit to stand test in < 15 seconds indicating an increased LE strength and improved balance.    Time  12    Period  Weeks    Status  New    Target Date  12/19/17      PT LONG TERM GOAL #4   Title  Patient will increase six minute walk test distance to >600 for progression to community ambulator and improve gait ability    Baseline  474 ft    Time  12    Period  Weeks    Status  New            Plan - 10/15/17 7510    Clinical Impression Statement  Patient performs therapeutic exericse and balance training wiht min assist and min rest periods. Patient required verbal and tactile cueing during dynamic standing balance activities in order to maintain center of gravity. Patient required CGA during all dynamic standing balance activities. He will continue to benefit from skilled PT to improve strength, balance and mobility.      Rehab Potential  Good    PT Frequency  2x / week    PT Duration  12 weeks    PT Treatment/Interventions  Dry needling;Manual techniques;Balance training;Neuromuscular re-education;Patient/family education;Therapeutic exercise;Therapeutic activities;Functional mobility training;Stair training;Gait training;Moist Heat;Cryotherapy;Electrical Stimulation    PT Next Visit Plan  add to HEP, balance training    Consulted and Agree with Plan of Care  Patient       Patient will benefit from skilled therapeutic intervention in order to improve the following deficits and impairments:  Abnormal gait, Decreased balance, Decreased endurance, Decreased mobility, Difficulty walking,  Impaired sensation, Impaired tone, Decreased activity tolerance, Decreased strength, Decreased safety awareness, Pain  Visit Diagnosis: Muscle weakness  Abnormality of gait  Unsteadiness     Problem List Patient Active Problem List   Diagnosis Date Noted  . Rosacea   . Gait disturbance, post-stroke 10/05/2015  . Presbycusis of both ears   . Advanced care planning/counseling discussion 06/23/2015  . Impaired mobility 04/23/2015  . HTN (hypertension) 03/18/2015  . Chronic diastolic heart failure, NYHA class 1 ([place]) 03/18/2015  . Spastic hemiparesis affecting dominant side ([place]) 07/07/2014  . Chest pressure 02/03/2014  . Medicare annual wellness visit, subsequent 11/06/2013  . Erectile dysfunction due to arterial insufficiency 02/05/2013  . MDD (major depressive disorder), recurrent episode, moderate ([place])   . Palpitations 06/26/2012  . HLD (hyperlipidemia)   . Restless leg syndrome   . GERD (gastroesophageal reflux disease)   . BPH with obstruction/lower urinary tract symptoms   . History of CVA (cerebrovascular accident) 03/11/2012  . Hemiparesis affecting right side as late effect of stroke ([place]) 03/11/2012  . Type 2 diabetes, controlled, with neuropathy ([place]) 03/11/2012  . OSA on CPAP 03/11/2012  . CKD (chronic kidney disease) stage 3, GFR 30-59 ml/min (HCC) 03/11/2012  . Migraines 03/11/2012    Alanson Puls, PT DPT 10/15/2017, 9:00 AM  [place] MAIN REHAB  [place], [name], [address] Phone: [phone]   Fax:  [phone]  Name: James SERFASS, James Bell MRN: [id number] Date of Birth: Dec 12, 1930

## 2017-10-17 ENCOUNTER — Ambulatory Visit: Payer: Medicare Other | Admitting: Physical Therapy

## 2017-10-17 ENCOUNTER — Encounter: Payer: Self-pay | Admitting: Physical Therapy

## 2017-10-17 DIAGNOSIS — R2681 Unsteadiness on feet: Secondary | ICD-10-CM

## 2017-10-17 DIAGNOSIS — M6281 Muscle weakness (generalized): Secondary | ICD-10-CM

## 2017-10-17 DIAGNOSIS — R269 Unspecified abnormalities of gait and mobility: Secondary | ICD-10-CM | POA: Diagnosis not present

## 2017-10-17 NOTE — Therapy (Signed)
[place] MAIN [facility] SERVICES [address] [place], [name], [address] Phone: [phone]   Fax:  [phone]  Physical Therapy Treatment  Patient Details  Name: James KIRCHGESSNER, James Bell MRN: [id number] Date of Birth: 04-02-1931 Referring Provider: Charlett Blake    Encounter Date: 10/17/2017  PT End of Session - 10/17/17 0941    Visit Number  5    Number of Visits  25    Date for PT Re-Evaluation  12/19/17    PT Start Time  0920    PT Stop Time  1005    PT Time Calculation (min)  45 min    Equipment Utilized During Treatment  Gait belt;Back brace    Activity Tolerance  Patient tolerated treatment well;Patient limited by fatigue    Behavior During Therapy  [facility] for tasks assessed/performed       Past Medical History:  Diagnosis Date  . Anemia   . Basal cell carcinoma 2018   R temple   . Basal cell carcinoma of nose 2002   bridge of nose  . BPH with obstruction/lower urinary tract symptoms    failed flomax, uroxatral, myrbetriq - sees urology [name]) pending videourodynamics  . CKD (chronic kidney disease) stage 3, GFR 30-59 ml/min (HCC) 03/11/2012   Renal US 02/2014 - multiple kidney cysts   . CVA (cerebral infarction) 03/07/12   slurred speech; right hemplegia, left handed - completed PT 07/2014 (17 sessions)  . Depression    mild, on cymbalta per prior PCP  . Erectile dysfunction    per urology (prostaglandin injection)  . GERD (gastroesophageal reflux disease)    with sliding HH  . H/O hiatal hernia   . History of kidney problems 12/2010   lacerated left kidney after fall  . HLD (hyperlipidemia)   . Kidney cysts 02/2014   bilateral (renal US)  . Migraines 03/11/12   now rare  . OSA on CPAP    sleep study 01/2014 - severe, CPAP at 10, previously on nuvigil (Dr. [name])  . Palpitations 01/2012   holter WNL, rare PAC/PVCs  . Presbycusis of both ears 2016   rec amplification [facility])  . Restless leg syndrome    well controlled on mirapex  . Rosacea    Kowalski  . TIA (transient ischemic attack)   . Type 2 diabetes, controlled, with neuropathy [facility]) 2011   [name] in [facility]    Past Surgical History:  Procedure Laterality Date  . ABI  2007   WNL  . CARDIAC CATHETERIZATION  2004   normal; [place] [facility]  . CARDIOVASCULAR STRESS TEST  02/2014   WNL, low risk scan, EF 68%  . CAROTID ENDARTERECTOMY    . CATARACT EXTRACTION W/ INTRAOCULAR LENS IMPLANT  11//2012 - 08/2011  . COLONOSCOPY  03/2011   poor prep, unable to biopsy polyp in tic fold, rpt 3 mo [name])  . COLONOSCOPY  06/2011   mult polyps adenomatous, severe diverticulosis, rpt 3 yrs [name])  . CYSTOSCOPY WITH URETHRAL DILATATION N/A 07/24/2013   Procedure: CYSTOSCOPY WITH URETHRAL DILATATION;  Surgeon: [name], James Bell;  Location: [facility];  Service: Urology;  Laterality: N/A;  . ESOPHAGOGASTRODUODENOSCOPY  2006   duodenitis, medual HH, Grade 2 reflux, mild gastritis  . ESOPHAGOGASTRODUODENOSCOPY  07/2011   mild chronic gastritis  . [place]  . [place]; 2002   left, then repaired  . MRI brain  04/2010   WNL  . PROSTATE SURGERY  02/2009   PVP (laser)  . SKIN CANCER EXCISION  2002   bridge of nose, BCC  . TONSILLECTOMY AND ADENOIDECTOMY  ~ 1945  . TRANSURETHRAL INCISION OF PROSTATE N/A 07/24/2013   Procedure: TRANSURETHRAL INCISION OF THE PROSTATE (TUIP);  Surgeon: [name], James Bell;  Location: [facility];  Service: Urology;  Laterality: N/A;  . URETHRAL DILATION  2014   tannenbaum    There were no vitals filed for this visit.  Subjective Assessment - 10/17/17 0929    Subjective  Patient is doing better today. He has no new concerns and is doing well today. He would like to review the HEP today.    Pertinent History  Patinet had CVA March 11 2012. He had rehab with hospital and SNF and then outpatient therapy. He wears a lumbar corset to decrease his back pain if he is standing.     How long  can you sit comfortably?  unlimited    How long can you stand comfortably?  10 or 15 mins    How long can you walk comfortably?  15 mins using a rollator outdoors.    Patient Stated Goals  Patient wants to be able to stand for longer periods and walk for longer periods.    Currently in Pain?  No/denies    Pain Score  0-No pain      Treatment: TM walking 1. 2 and elevation of 2 for 5 mins TM side stepping left and right at . 2 miles/ hour x 3 mins per side Reviewed HEP: Standing hip extension with RTB x 10 BLE,  Standing hip abduction with RTB x 10 BLE Terminal knee extension with RTB x 10 Bridging x 10  Supine SLR x 10  Patient needs cues to not ER his RLE during TM walking and during standing exercises, to perform slowly and to stand with correct posture. Patient has some fatigue with exercises and some minimal back pain that goes away after hip extension is stopped.                         PT Education - 10/17/17 0930    Education provided  Yes    Education Details  safety with steps     Person(s) Educated  Patient    Methods  Explanation    Comprehension  Verbalized understanding       PT Short Term Goals - 09/26/17 1030      PT SHORT TERM GOAL #1   Title  Patient will ascend and descend step without railing and use of hemiwalker without loss of balance to be able to safely get into his house.    Time  6    Period  Weeks    Status  New    Target Date  11/07/17        PT Long Term Goals - 09/26/17 1026      PT LONG TERM GOAL #1   Title  Patient will be able to stand for 1 hour without increase in pain to demonstrate improved tolerance for ADLs.     Baseline  10-15 mins with lumbar corset    Time  12    Period  Weeks    Status  New    Target Date  12/19/17      PT LONG TERM GOAL #2   Title  Patient will reduce timed up and go to <11 seconds to reduce fall risk and demonstrate improved transfer/gait  ability.    Time  12    Period  Weeks     Status  New      PT LONG TERM GOAL #3   Title  Patient (> 30 years old) will complete five times sit to stand test in < 15 seconds indicating an increased LE strength and improved balance.    Time  12    Period  Weeks    Status  New    Target Date  12/19/17      PT LONG TERM GOAL #4   Title  Patient will increase six minute walk test distance to >600 for progression to community ambulator and improve gait ability    Baseline  474 ft    Time  12    Period  Weeks    Status  New            Plan - 10/17/17 1051    Clinical Impression Statement  Patient performs therapeutic exercise today for BLE and was able to progress his level of exercises to more advanced and challenging. He needs cues to keep his RLE foot from erternally rotation and for correct posture during standing exercises. His HEP was reviewed and given a written handout. He will conitntue to benefit from skilled PT to improve mobility and safety.    Rehab Potential  Good    PT Frequency  2x / week    PT Duration  12 weeks    PT Treatment/Interventions  Dry needling;Manual techniques;Balance training;Neuromuscular re-education;Patient/family education;Therapeutic exercise;Therapeutic activities;Functional mobility training;Stair training;Gait training;Moist Heat;Cryotherapy;Electrical Stimulation    PT Next Visit Plan  add to HEP, balance training    Consulted and Agree with Plan of Care  Patient       Patient will benefit from skilled therapeutic intervention in order to improve the following deficits and impairments:  Abnormal gait, Decreased balance, Decreased endurance, Decreased mobility, Difficulty walking, Impaired sensation, Impaired tone, Decreased activity tolerance, Decreased strength, Decreased safety awareness, Pain  Visit Diagnosis: Muscle weakness  Abnormality of gait  Unsteadiness     Problem List Patient Active Problem List   Diagnosis Date Noted  . Rosacea   . Gait disturbance, post-stroke  10/05/2015  . Presbycusis of both ears   . Advanced care planning/counseling discussion 06/23/2015  . Impaired mobility 04/23/2015  . HTN (hypertension) 03/18/2015  . Chronic diastolic heart failure, NYHA class 1 ([place]) 03/18/2015  . Spastic hemiparesis affecting dominant side ([place]) 07/07/2014  . Chest pressure 02/03/2014  . Medicare annual wellness visit, subsequent 11/06/2013  . Erectile dysfunction due to arterial insufficiency 02/05/2013  . MDD (major depressive disorder), recurrent episode, moderate ([place])   . Palpitations 06/26/2012  . HLD (hyperlipidemia)   . Restless leg syndrome   . GERD (gastroesophageal reflux disease)   . BPH with obstruction/lower urinary tract symptoms   . History of CVA (cerebrovascular accident) 03/11/2012  . Hemiparesis affecting right side as late effect of stroke ([place]) 03/11/2012  . Type 2 diabetes, controlled, with neuropathy ([place]) 03/11/2012  . OSA on CPAP 03/11/2012  . CKD (chronic kidney disease) stage 3, GFR 30-59 ml/min (HCC) 03/11/2012  . Migraines 03/11/2012    Alanson Puls, PT DPT 10/17/2017, 10:55 AM  [place] MAIN [facility] SERVICES [address] [place], [name], [address] Phone: [phone]   Fax:  [phone]  Name: James MEYN, James Bell MRN: [id number] Date of Birth: 1931-07-19

## 2017-10-17 NOTE — Patient Instructions (Addendum)
KNEE: Extension - Standing (Band)    Place band around back of knee. Allow knee to bend. Push knee against band to straighten; squeeze glutes. Do not hyperextend or lock knee. Hold __5_ seconds. Use ____theraband____ band. _15__ reps per set, __1_ sets per day, __7HIP: Abduction - Standing (Band)    Place band around legs. Squeeze glutes. Raise leg out and slightly back. Hold 2___ seconds. Use __theraband______ band. _15__ reps per set, _1__ sets per day, _7__ days per week Hold onto a support.  Copyright  VHI. All rights reserved.  HIP: Abduction - Standing (Band)  Hip Extension: Standing (Single Leg)     Pull  leg back, keeping knee nearly straight. Repeat _15_ times per set. Repeat with other leg. Do 1__ sets per session. Do _7SIT TO STAND: Feet in Modified Tandem    Place one foot in front of the other. Lean chest forward. Raise hips and straighten knees to stand. _10__ reps per set, _1__ sets per day, __7_ days per week Hold onto a support. Repeat with other leg.  Copyright  VHI. All rights reserved.  _ sessions per week.  http://tub.exer.us/193   Copyright  VHI. All rights reserved.    Leg Raise: Straight - Hook-Lying (Single Leg)    Reclined sitting, one leg straight, with arm support, anchor tubing around both feet. Lift straight leg. Repeat _10_ times per set. Repeat with other leg. Do _1_ sets per session. Do _7_ sessions per week.  http://tub.exer.us/187   Copyright  VHI. All rights reserved.  SUPINE: Bridging    Place feet on surface. Tighten glutes. Raise hips up. _10__ reps per set, __1_ sets per day, __7_ days per week

## 2017-10-22 ENCOUNTER — Ambulatory Visit: Payer: Medicare Other | Attending: Physical Medicine & Rehabilitation | Admitting: Physical Therapy

## 2017-10-22 ENCOUNTER — Encounter: Payer: Self-pay | Admitting: Physical Therapy

## 2017-10-22 DIAGNOSIS — R269 Unspecified abnormalities of gait and mobility: Secondary | ICD-10-CM | POA: Diagnosis not present

## 2017-10-22 DIAGNOSIS — R2681 Unsteadiness on feet: Secondary | ICD-10-CM | POA: Diagnosis not present

## 2017-10-22 DIAGNOSIS — M6281 Muscle weakness (generalized): Secondary | ICD-10-CM | POA: Diagnosis not present

## 2017-10-22 NOTE — Therapy (Signed)
[place] MAIN [facility] SERVICES [address] [place], [name], [address] Phone: [phone]   Fax:  [phone]  Physical Therapy Treatment  Patient Details  Name: James HOGE, MD MRN: [id number] Date of Birth: 1930-10-13 Referring Provider: Charlett Blake    Encounter Date: 10/22/2017  PT End of Session - 10/22/17 1008    Visit Number  6    Number of Visits  25    Date for PT Re-Evaluation  12/19/17    PT Start Time  0920    PT Stop Time  1005    PT Time Calculation (min)  45 min    Equipment Utilized During Treatment  Gait belt;Back brace    Activity Tolerance  Patient tolerated treatment well;Patient limited by fatigue;Patient limited by pain    Behavior During Therapy  [facility] for tasks assessed/performed       Past Medical History:  Diagnosis Date  . Anemia   . Basal cell carcinoma 2018   R temple   . Basal cell carcinoma of nose 2002   bridge of nose  . BPH with obstruction/lower urinary tract symptoms    failed flomax, uroxatral, myrbetriq - sees urology [name]) pending videourodynamics  . CKD (chronic kidney disease) stage 3, GFR 30-59 ml/min (HCC) 03/11/2012   Renal US 02/2014 - multiple kidney cysts   . CVA (cerebral infarction) 03/07/12   slurred speech; right hemplegia, left handed - completed PT 07/2014 (17 sessions)  . Depression    mild, on cymbalta per prior PCP  . Erectile dysfunction    per urology (prostaglandin injection)  . GERD (gastroesophageal reflux disease)    with sliding HH  . H/O hiatal hernia   . History of kidney problems 12/2010   lacerated left kidney after fall  . HLD (hyperlipidemia)   . Kidney cysts 02/2014   bilateral (renal US)  . Migraines 03/11/12   now rare  . OSA on CPAP    sleep study 01/2014 - severe, CPAP at 10, previously on nuvigil (Dr. [name])  . Palpitations 01/2012   holter WNL, rare PAC/PVCs  . Presbycusis of both ears 2016   rec amplification [facility])  .  Restless leg syndrome    well controlled on mirapex  . Rosacea    [place]owalski  . TIA (transient ischemic attack)   . Type 2 diabetes, controlled, with neuropathy [facility]) 2011   [name] in [facility]    Past Surgical History:  Procedure Laterality Date  . ABI  2007   WNL  . CARDIAC CATHETERIZATION  2004   normal; [place]  . CARDIOVASCULAR STRESS TEST  02/2014   WNL, low risk scan, EF 68%  . CAROTID ENDARTERECTOMY    . CATARACT EXTRACTION W/ INTRAOCULAR LENS IMPLANT  11//2012 - 08/2011  . COLONOSCOPY  03/2011   poor prep, unable to biopsy polyp in tic fold, rpt 3 mo [name])  . COLONOSCOPY  06/2011   mult polyps adenomatous, severe diverticulosis, rpt 3 yrs [name])  . CYSTOSCOPY WITH URETHRAL DILATATION N/A 07/24/2013   Procedure: CYSTOSCOPY WITH URETHRAL DILATATION;  Surgeon: [name], MD;  [facility];  Service: Urology;  Laterality: N/A;  . ESOPHAGOGASTRODUODENOSCOPY  2006   duodenitis, medual HH, Grade 2 reflux, mild gastritis  . ESOPHAGOGASTRODUODENOSCOPY  07/2011   mild chronic gastritis  . [place]  . [place]; 2002   left, then repaired  . MRI brain  04/2010   WNL  .  PROSTATE SURGERY  02/2009   PVP (laser)  . SKIN CANCER EXCISION  2002   bridge of nose, BCC  . TONSILLECTOMY AND ADENOIDECTOMY  ~ 1945  . TRANSURETHRAL INCISION OF PROSTATE N/A 07/24/2013   Procedure: TRANSURETHRAL INCISION OF THE PROSTATE (TUIP);  Surgeon: [name], MD;  Location: [facility];  Service: Urology;  Laterality: N/A;  . URETHRAL DILATION  2014   tannenbaum    There were no vitals filed for this visit.  Subjective Assessment - 10/22/17 1006    Subjective  Patient reports no pain but does have some back pain during standing balance exercises during his treatment.     Currently in Pain?  Yes    Pain Score  3     Pain Location  Back    Pain Orientation  Lower    Pain Descriptors / Indicators  Aching    Pain Onset  Today    Pain Frequency   Intermittent    Aggravating Factors   balance activities and standing    Pain Relieving Factors  sitting    Effect of Pain on Daily Activities  needs to rest more    Multiple Pain Sites  No      Treatment:  Nu-step ( warm up x 5 mins) Matirx fwd/bwd x 3 repetitions 17. 5 lbs, side stepping x 2 reps leading with L side with min assist  Step fwd / bwd over 1/2 foam without UE support and CGA x 10 Side stepping over 1/2 foam x 20 with cues to take a bigger step and to weight shift and with min assist NBOS on foam with head turns x 1 min Standing on foam with head turns x 1 min Patient needs 2 seated rest periods with TA contractions to decrease his back pain , due to flair up during longer standing time with above exercise Patient instructed in hooklying TA with marching x 20 and was given written handout  Patient was educated on continuing HEP and next visit to perform outcome measures and look at goals.                        PT Education - 10/22/17 1008    Education provided  Yes    Education Details  hooklying marching with TA contraction    Person(s) Educated  Patient    Methods  Explanation;Demonstration    Comprehension  Verbalized understanding;Returned demonstration;Verbal cues required;Need further instruction       PT Short Term Goals - 09/26/17 1030      PT SHORT TERM GOAL #1   Title  Patient will ascend and descend step without railing and use of hemiwalker without loss of balance to be able to safely get into his house.    Time  6    Period  Weeks    Status  New    Target Date  11/07/17        PT Long Term Goals - 09/26/17 1026      PT LONG TERM GOAL #1   Title  Patient will be able to stand for 1 hour without increase in pain to demonstrate improved tolerance for ADLs.     Baseline  10-15 mins with lumbar corset    Time  12    Period  Weeks    Status  New    Target Date  12/19/17      PT LONG TERM GOAL #2   Title  Patient will  reduce timed up and go to <11 seconds to reduce fall risk and demonstrate improved transfer/gait ability.    Time  12    Period  Weeks    Status  New      PT LONG TERM GOAL #3   Title  Patient (> 68 years old) will complete five times sit to stand test in < 15 seconds indicating an increased LE strength and improved balance.    Time  12    Period  Weeks    Status  New    Target Date  12/19/17      PT LONG TERM GOAL #4   Title  Patient will increase six minute walk test distance to >600 for progression to community ambulator and improve gait ability    Baseline  474 ft    Time  12    Period  Weeks    Status  New            Plan - 10/22/17 1009    Clinical Impression Statement  Patient required min verbal cueing during matrix machine stepping, and required min during all dynamic standing balance activities. Patient required occasional rest breaks between exercises due to fatigue. He was instructed in sitting and supine/hooklying marching with TA contraction and given a handout.  Patient tolerated exercise well. Patient will continue to benefit from skilled therapy in order to improve dynamic standing balance activities and increase gait speed to reduce risk for falls    Rehab Potential  Good    PT Frequency  2x / week    PT Duration  12 weeks    PT Treatment/Interventions  Dry needling;Manual techniques;Balance training;Neuromuscular re-education;Patient/family education;Therapeutic exercise;Therapeutic activities;Functional mobility training;Stair training;Gait training;Moist Heat;Cryotherapy;Electrical Stimulation    PT Next Visit Plan  add to HEP, balance training    Consulted and Agree with Plan of Care  Patient       Patient will benefit from skilled therapeutic intervention in order to improve the following deficits and impairments:  Abnormal gait, Decreased balance, Decreased endurance, Decreased mobility, Difficulty walking, Impaired sensation, Impaired tone, Decreased  activity tolerance, Decreased strength, Decreased safety awareness, Pain  Visit Diagnosis: Muscle weakness  Abnormality of gait  Unsteadiness     Problem List Patient Active Problem List   Diagnosis Date Noted  . Rosacea   . Gait disturbance, post-stroke 10/05/2015  . Presbycusis of both ears   . Advanced care planning/counseling discussion 06/23/2015  . Impaired mobility 04/23/2015  . HTN (hypertension) 03/18/2015  . Chronic diastolic heart failure, NYHA class 1 ([place]) 03/18/2015  . Spastic hemiparesis affecting dominant side ([place]) 07/07/2014  . Chest pressure 02/03/2014  . Medicare annual wellness visit, subsequent 11/06/2013  . Erectile dysfunction due to arterial insufficiency 02/05/2013  . MDD (major depressive disorder), recurrent episode, moderate ([place])   . Palpitations 06/26/2012  . HLD (hyperlipidemia)   . Restless leg syndrome   . GERD (gastroesophageal reflux disease)   . BPH with obstruction/lower urinary tract symptoms   . History of CVA (cerebrovascular accident) 03/11/2012  . Hemiparesis affecting right side as late effect of stroke ([place]) 03/11/2012  . Type 2 diabetes, controlled, with neuropathy ([place]) 03/11/2012  . OSA on CPAP 03/11/2012  . CKD (chronic kidney disease) stage 3, GFR 30-59 ml/min (HCC) 03/11/2012  . Migraines 03/11/2012    Alanson Puls, PT DPT 10/22/2017, 10:17 AM  [place] MAIN [facility] SERVICES [address] [place], [name], [address] Phone: [phone]   Fax:  [phone]  Name: James ROPER, MD MRN: [id number] Date of Birth: [birth year]

## 2017-10-22 NOTE — Patient Instructions (Signed)
Pelvic Tilt: Posterior - Legs Bent (Supine)    Tighten stomach and flatten back by rolling pelvis down... March legs one at a time slowly  Repeat __20__ times per set. Do _2___ sets per session. Do ___1_ sessions per day.  http://orth.exer.us/202   Copyright  VHI. All rights reserved.

## 2017-10-24 ENCOUNTER — Ambulatory Visit: Payer: Medicare Other | Admitting: Physical Therapy

## 2017-10-24 ENCOUNTER — Encounter: Payer: Self-pay | Admitting: Physical Therapy

## 2017-10-24 DIAGNOSIS — M6281 Muscle weakness (generalized): Secondary | ICD-10-CM | POA: Diagnosis not present

## 2017-10-24 DIAGNOSIS — R269 Unspecified abnormalities of gait and mobility: Secondary | ICD-10-CM

## 2017-10-24 DIAGNOSIS — R2681 Unsteadiness on feet: Secondary | ICD-10-CM | POA: Diagnosis not present

## 2017-10-24 NOTE — Therapy (Signed)
[place] MAIN [facility] SERVICES [place], [name], [address] Phone: [phone]   Fax:  [phone]  Physical Therapy Treatment  Patient Details  Name: James GUICE, MD MRN: [id number] Date of Birth: April 23, 1931 Referring Provider: Charlett Blake    Encounter Date: 10/24/2017  PT End of Session - 10/24/17 0919    Visit Number  7    Number of Visits  25    Date for PT Re-Evaluation  12/19/17    PT Start Time  0845    PT Stop Time  0930    PT Time Calculation (min)  45 min    Equipment Utilized During Treatment  Gait belt    Activity Tolerance  Patient limited by fatigue;Patient tolerated treatment well    Behavior During Therapy  [facility] for tasks assessed/performed       Past Medical History:  Diagnosis Date  . Anemia   . Basal cell carcinoma 2018   R temple   . Basal cell carcinoma of nose 2002   bridge of nose  . BPH with obstruction/lower urinary tract symptoms    failed flomax, uroxatral, myrbetriq - sees urology [name]) pending videourodynamics  . CKD (chronic kidney disease) stage 3, GFR 30-59 ml/min (HCC) 03/11/2012   Renal US 02/2014 - multiple kidney cysts   . CVA (cerebral infarction) 03/07/12   slurred speech; right hemplegia, left handed - completed PT 07/2014 (17 sessions)  . Depression    mild, on cymbalta per prior PCP  . Erectile dysfunction    per urology (prostaglandin injection)  . GERD (gastroesophageal reflux disease)    with sliding HH  . H/O hiatal hernia   . History of kidney problems 12/2010   lacerated left kidney after fall  . HLD (hyperlipidemia)   . Kidney cysts 02/2014   bilateral (renal US)  . Migraines 03/11/12   now rare  . OSA on CPAP    sleep study 01/2014 - severe, CPAP at 10, previously on nuvigil (Dr. [name])  . Palpitations 01/2012   holter WNL, rare PAC/PVCs  . Presbycusis of both ears 2016   rec amplification [facility])  . Restless leg syndrome    well  controlled on mirapex  . Rosacea    K[place]ki  . TIA (transient ischemic attack)   . Type 2 diabetes, controlled, with neuropathy [facility]) 2011   [name] in [facility]    Past Surgical History:  Procedure Laterality Date  . ABI  2007   WNL  . CARDIAC CATHETERIZATION  2004   normal; [place]  . CARDIOVASCULAR STRESS TEST  02/2014   WNL, low risk scan, EF 68%  . CAROTID ENDARTERECTOMY    . CATARACT EXTRACTION W/ INTRAOCULAR LENS IMPLANT  11//2012 - 08/2011  . COLONOSCOPY  03/2011   poor prep, unable to biopsy polyp in tic fold, rpt 3 mo [name])  . COLONOSCOPY  06/2011   mult polyps adenomatous, severe diverticulosis, rpt 3 yrs [name])  . CYSTOSCOPY WITH URETHRAL DILATATION N/A 07/24/2013   Procedure: CYSTOSCOPY WITH URETHRAL DILATATION;  Surgeon: [name], MD;  Location: [facility];  Service: Urology;  Laterality: N/A;  . ESOPHAGOGASTRODUODENOSCOPY  2006   duodenitis, medual HH, Grade 2 reflux, mild gastritis  . ESOPHAGOGASTRODUODENOSCOPY  07/2011   mild chronic gastritis  . [place]  . [place]; 2002   left, then repaired  . MRI brain  04/2010   WNL  . PROSTATE SURGERY  02/2009   PVP (laser)  . SKIN CANCER EXCISION  2002   bridge of nose, BCC  . TONSILLECTOMY AND ADENOIDECTOMY  ~ 1945  . TRANSURETHRAL INCISION OF PROSTATE N/A 07/24/2013   Procedure: TRANSURETHRAL INCISION OF THE PROSTATE (TUIP);  Surgeon: [name], MD;  Location: [facility];  Service: Urology;  Laterality: N/A;  . URETHRAL DILATION  2014   tannenbaum    There were no vitals filed for this visit.  Subjective Assessment - 10/24/17 0915    Subjective  Patient has been trying the pelvic tilt in sitting to reduce his back pain that increases in standing.     Pertinent History  Patinet had CVA March 11 2012. He had rehab with hospital and SNF and then outpatient therapy. He wears a lumbar corset to decrease his back pain if he is standing.     How long can you sit  comfortably?  unlimited    How long can you stand comfortably?  10 or 15 mins    How long can you walk comfortably?  15 mins using a rollator outdoors.    Patient Stated Goals  Patient wants to be able to stand for longer periods and walk for longer periods.    Currently in Pain?  No/denies    Pain Score  0-No pain    Pain Onset  Today    Multiple Pain Sites  No       Therapeutic exercise; hooklying abd/ER with TA x 20 hooklying marching with TA x 20  Standing tapping stepping stones left and right x 20  Standing tapping stepping stones fwd alternating x 20 Outcome measurers performed: TUG and 5 x sit to stand and 6 MW and goals  Reviewed and assessed                         PT Short Term Goals - 10/24/17 9379      PT SHORT TERM GOAL #1   Title  Patient will ascend and descend step without railing and use of hemiwalker without loss of balance to be able to safely get into his house.    Baseline  Patient continues to use the railing with steps    Time  6    Period  Weeks    Status  On-going    Target Date  11/14/17        PT Long Term Goals - 10/24/17 0859      PT LONG TERM GOAL #1   Title  Patient will be able to stand for 1 hour without increase in pain to demonstrate improved tolerance for ADLs.     Baseline  15 mins    Period  Weeks    Status  On-going    Target Date  12/20/99      PT LONG TERM GOAL #2   Title  Patient will reduce timed up and go to <11 seconds to reduce fall risk and demonstrate improved transfer/gait ability.    Baseline  26 sec, 10/24/17 18.83 sec    Time  12    Period  Weeks    Status  On-going      PT LONG TERM GOAL #3   Title  Patient (> 30 years old) will complete five times sit to stand test in < 15 seconds indicating an increased LE strength and improved balance.    Baseline  5x sit to stand in 25.73 seconds. , 22.63 sec 10/24/17  Time  12    Period  Weeks    Status  On-going    Target Date  12/19/17      PT  LONG TERM GOAL #4   Title  Patient will increase six minute walk test distance to >600 for progression to community ambulator and improve gait ability    Baseline  474 ft, 710 feet    Time  12    Period  Weeks    Status  On-going    Target Date  12/19/17            Plan - 10/24/17 0920    Clinical Impression Statement  Pt is making improvements in BLE strength and balance as evidenced by improvements in his 5xSTS and TUG times.  Provided pt with HEP handout with back exercises to strengthen his core,  and encouraged pt to complete these exercises at least 5 days/wk at a convenient time of day for him.  He demonstrates instability with posterior ambulation and when balancing  with toe taps with ant/post stepping stones.  He will benefit from continued skilled PT interventions for improved strength, balance, and QOL    Rehab Potential  Good    PT Frequency  2x / week    PT Duration  12 weeks    PT Treatment/Interventions  Dry needling;Manual techniques;Balance training;Neuromuscular re-education;Patient/family education;Therapeutic exercise;Therapeutic activities;Functional mobility training;Stair training;Gait training;Moist Heat;Cryotherapy;Electrical Stimulation    PT Next Visit Plan  add to HEP, balance training    Consulted and Agree with Plan of Care  Patient       Patient will benefit from skilled therapeutic intervention in order to improve the following deficits and impairments:  Abnormal gait, Decreased balance, Decreased endurance, Decreased mobility, Difficulty walking, Impaired sensation, Impaired tone, Decreased activity tolerance, Decreased strength, Decreased safety awareness, Pain  Visit Diagnosis: Muscle weakness  Abnormality of gait  Unsteadiness     Problem List Patient Active Problem List   Diagnosis Date Noted  . Rosacea   . Gait disturbance, post-stroke 10/05/2015  . Presbycusis of both ears   . Advanced care planning/counseling discussion 06/23/2015   . Impaired mobility 04/23/2015  . HTN (hypertension) 03/18/2015  . Chronic diastolic heart failure, NYHA class 1 ([place]) 03/18/2015  . Spastic hemiparesis affecting dominant side ([place]) 07/07/2014  . Chest pressure 02/03/2014  . Medicare annual wellness visit, subsequent 11/06/2013  . Erectile dysfunction due to arterial insufficiency 02/05/2013  . MDD (major depressive disorder), recurrent episode, moderate ([place])   . Palpitations 06/26/2012  . HLD (hyperlipidemia)   . Restless leg syndrome   . GERD (gastroesophageal reflux disease)   . BPH with obstruction/lower urinary tract symptoms   . History of CVA (cerebrovascular accident) 03/11/2012  . Hemiparesis affecting right side as late effect of stroke ([place]) 03/11/2012  . Type 2 diabetes, controlled, with neuropathy ([place]) 03/11/2012  . OSA on CPAP 03/11/2012  . CKD (chronic kidney disease) stage 3, GFR 30-59 ml/min (HCC) 03/11/2012  . Migraines 03/11/2012    Alanson Puls, PT DPT 10/24/2017, 9:43 AM  [place] MAIN [facility] SERVICES [address] [place], [name], [address] Phone: [phone]   Fax:  [phone]  Name: James MORTEN, MD MRN: [id number] Date of Birth: 11-Feb-1931

## 2017-10-29 ENCOUNTER — Ambulatory Visit: Payer: Medicare Other | Admitting: Physical Therapy

## 2017-10-29 ENCOUNTER — Encounter: Payer: Self-pay | Admitting: Physical Therapy

## 2017-10-29 DIAGNOSIS — R269 Unspecified abnormalities of gait and mobility: Secondary | ICD-10-CM

## 2017-10-29 DIAGNOSIS — R2681 Unsteadiness on feet: Secondary | ICD-10-CM | POA: Diagnosis not present

## 2017-10-29 DIAGNOSIS — M6281 Muscle weakness (generalized): Secondary | ICD-10-CM

## 2017-10-29 NOTE — Patient Instructions (Signed)
Feet Apart, Head Motion - Eyes Open    With eyes open, feet apart, move head slowly: up and down. Repeat __2__ mins per session. Do _1___ sessions per day.  Copyright  VHI. All rights reserved.

## 2017-10-29 NOTE — Therapy (Signed)
[place] MAIN [facility] SERVICES [address] [place], [name], [address] Phone: [phone]   Fax:  [phone]  Physical Therapy Treatment  Patient Details  Name: James REICHART, James Bell MRN: [id number] Date of Birth: 29-Jul-1931 Referring Provider: Charlett Blake    Encounter Date: 10/29/2017  PT End of Session - 10/29/17 1608    Visit Number  8    Number of Visits  25    Date for PT Re-Evaluation  12/19/17    PT Start Time  0350    PT Stop Time  0430    PT Time Calculation (min)  40 min    Equipment Utilized During Treatment  Gait belt    Activity Tolerance  Patient limited by fatigue;Patient tolerated treatment well    Behavior During Therapy  [facility] for tasks assessed/performed       Past Medical History:  Diagnosis Date  . Anemia   . Basal cell carcinoma 2018   R temple   . Basal cell carcinoma of nose 2002   bridge of nose  . BPH with obstruction/lower urinary tract symptoms    failed flomax, uroxatral, myrbetriq - sees urology [name]) pending videourodynamics  . CKD (chronic kidney disease) stage 3, GFR 30-59 ml/min (HCC) 03/11/2012   Renal US 02/2014 - multiple kidney cysts   . CVA (cerebral infarction) 03/07/12   slurred speech; right hemplegia, left handed - completed PT 07/2014 (17 sessions)  . Depression    mild, on cymbalta per prior PCP  . Erectile dysfunction    per urology (prostaglandin injection)  . GERD (gastroesophageal reflux disease)    with sliding HH  . H/O hiatal hernia   . History of kidney problems 12/2010   lacerated left kidney after fall  . HLD (hyperlipidemia)   . Kidney cysts 02/2014   bilateral (renal US)  . Migraines 03/11/12   now rare  . OSA on CPAP    sleep study 01/2014 - severe, CPAP at 10, previously on nuvigil (Dr. [name])  . Palpitations 01/2012   holter WNL, rare PAC/PVCs  . Presbycusis of both ears 2016   rec amplification [facility])  . Restless leg syndrome    well  controlled on mirapex  . Rosacea    [place]  . TIA (transient ischemic attack)   . Type 2 diabetes, controlled, with neuropathy [facility]) 2011   [name] in [facility]    Past Surgical History:  Procedure Laterality Date  . ABI  2007   WNL  . CARDIAC CATHETERIZATION  2004   normal; [place]  . CARDIOVASCULAR STRESS TEST  02/2014   WNL, low risk scan, EF 68%  . CAROTID ENDARTERECTOMY    . CATARACT EXTRACTION W/ INTRAOCULAR LENS IMPLANT  11//2012 - 08/2011  . COLONOSCOPY  03/2011   poor prep, unable to biopsy polyp in tic fold, rpt 3 mo [name])  . COLONOSCOPY  06/2011   mult polyps adenomatous, severe diverticulosis, rpt 3 yrs [name])  . CYSTOSCOPY WITH URETHRAL DILATATION N/A 07/24/2013   Procedure: CYSTOSCOPY WITH URETHRAL DILATATION;  Surgeon: [name], James Bell;  [facility];  Service: Urology;  Laterality: N/A;  . ESOPHAGOGASTRODUODENOSCOPY  2006   duodenitis, medual HH, Grade 2 reflux, mild gastritis  . ESOPHAGOGASTRODUODENOSCOPY  07/2011   mild chronic gastritis  . [place]  . [place]; 2002   left, then repaired  . MRI brain  04/2010   WNL  . PROSTATE SURGERY  02/2009   PVP (laser)  . SKIN CANCER EXCISION  2002   bridge of nose, BCC  . TONSILLECTOMY AND ADENOIDECTOMY  ~ 1945  . TRANSURETHRAL INCISION OF PROSTATE N/A 07/24/2013   Procedure: TRANSURETHRAL INCISION OF THE PROSTATE (TUIP);  Surgeon: [name], James Bell;  [facility];  Service: Urology;  Laterality: N/A;  . URETHRAL DILATION  2014   tannenbaum    There were no vitals filed for this visit.  Subjective Assessment - 10/29/17 1558    Subjective  Patient has been trying the pelvic tilt in sitting to reduce his back pain that increases in standing.     Pertinent History  Patinet had CVA March 11 2012. He had rehab with hospital and SNF and then outpatient therapy. He wears a lumbar corset to decrease his back pain if he is standing.     How long can you sit  comfortably?  unlimited    How long can you stand comfortably?  10 or 15 mins    How long can you walk comfortably?  15 mins using a rollator outdoors.    Patient Stated Goals  Patient wants to be able to stand for longer periods and walk for longer periods.    Currently in Pain?  No/denies    Pain Score  0-No pain    Pain Onset  Today    Multiple Pain Sites  No      Neuromuscular training: Nu-step warmup x 5 mins  1/2 foam with feet apart and without UE support  X 2 mins  Tandem stand with feet apart on 1/2 foam and weight shifting fwd/bwd x 2 mins  Standing on foam and tapping stepping stone, too difficult and changed to flat surface and tapping to stepping stone x 20  Standing on foam and passing cones across midline to rotate trunk x 20 , then stacking cones to low surface with feet together on foam   Standing on flat surface with feet together and head turns x 20    Min to mod verbal cues used throughout and CGA  with increased in postural sway and LOB most seen with narrow base of support and while on uneven surfaces. Continues to have balance deficits typical with diagnosis. Patient performs intermediate level exercises without pain behaviors and needs verbal cuing for postural alignment and head positioning                        PT Education - 10/29/17 1607    Education provided  Yes    Education Details  Reviewed pelvic tilt in sitting when he has back pain    Person(s) Educated  Patient    Methods  Explanation;Verbal cues;Demonstration    Comprehension  Verbalized understanding;Verbal cues required;Returned demonstration       PT Short Term Goals - 10/24/17 0858      PT SHORT TERM GOAL #1   Title  Patient will ascend and descend step without railing and use of hemiwalker without loss of balance to be able to safely get into his house.    Baseline  Patient continues to use the railing with steps    Time  6    Period  Weeks    Status  On-going     Target Date  11/14/17        PT Long Term Goals - 10/24/17 0859      PT LONG TERM GOAL #1   Title  Patient will  be able to stand for 1 hour without increase in pain to demonstrate improved tolerance for ADLs.     Baseline  15 mins    Period  Weeks    Status  On-going    Target Date  12/20/99      PT LONG TERM GOAL #2   Title  Patient will reduce timed up and go to <11 seconds to reduce fall risk and demonstrate improved transfer/gait ability.    Baseline  26 sec, 10/24/17 18.83 sec    Time  12    Period  Weeks    Status  On-going      PT LONG TERM GOAL #3   Title  Patient (> 25 years old) will complete five times sit to stand test in < 15 seconds indicating an increased LE strength and improved balance.    Baseline  5x sit to stand in 25.73 seconds. , 22.63 sec 10/24/17    Time  12    Period  Weeks    Status  On-going    Target Date  12/19/17      PT LONG TERM GOAL #4   Title  Patient will increase six minute walk test distance to >600 for progression to community ambulator and improve gait ability    Baseline  474 ft, 710 feet    Time  12    Period  Weeks    Status  On-going    Target Date  12/19/17              Patient will benefit from skilled therapeutic intervention in order to improve the following deficits and impairments:     Visit Diagnosis: Muscle weakness  Abnormality of gait  Unsteadiness     Problem List Patient Active Problem List   Diagnosis Date Noted  . Rosacea   . Gait disturbance, post-stroke 10/05/2015  . Presbycusis of both ears   . Advanced care planning/counseling discussion 06/23/2015  . Impaired mobility 04/23/2015  . HTN (hypertension) 03/18/2015  . Chronic diastolic heart failure, NYHA class 1 ([place]) 03/18/2015  . Spastic hemiparesis affecting dominant side ([place]) 07/07/2014  . Chest pressure 02/03/2014  . Medicare annual wellness visit, subsequent 11/06/2013  . Erectile dysfunction due to arterial insufficiency 02/05/2013   . MDD (major depressive disorder), recurrent episode, moderate ([place])   . Palpitations 06/26/2012  . HLD (hyperlipidemia)   . Restless leg syndrome   . GERD (gastroesophageal reflux disease)   . BPH with obstruction/lower urinary tract symptoms   . History of CVA (cerebrovascular accident) 03/11/2012  . Hemiparesis affecting right side as late effect of stroke ([place]) 03/11/2012  . Type 2 diabetes, controlled, with neuropathy ([place]) 03/11/2012  . OSA on CPAP 03/11/2012  . CKD (chronic kidney disease) stage 3, GFR 30-59 ml/min (HCC) 03/11/2012  . Migraines 03/11/2012    Alanson Puls, PT DPT 10/29/2017, 4:38 PM  [place] MAIN [facility] SERVICES [place], [name], [address] Phone: [phone]   Fax:  [phone]  Name: James BELAIR, James Bell MRN: [id number] Date of Birth: 23-May-1931

## 2017-10-31 ENCOUNTER — Ambulatory Visit (INDEPENDENT_AMBULATORY_CARE_PROVIDER_SITE_OTHER): Payer: Medicare Other | Admitting: Neurology

## 2017-10-31 ENCOUNTER — Ambulatory Visit: Payer: Medicare Other | Admitting: Physical Therapy

## 2017-10-31 ENCOUNTER — Encounter: Payer: Self-pay | Admitting: Neurology

## 2017-10-31 ENCOUNTER — Encounter: Payer: Self-pay | Admitting: Physical Therapy

## 2017-10-31 VITALS — BP 120/60 | HR 70 | Ht 68.0 in | Wt 195.0 lb

## 2017-10-31 DIAGNOSIS — R269 Unspecified abnormalities of gait and mobility: Secondary | ICD-10-CM

## 2017-10-31 DIAGNOSIS — Z9989 Dependence on other enabling machines and devices: Secondary | ICD-10-CM | POA: Diagnosis not present

## 2017-10-31 DIAGNOSIS — R2681 Unsteadiness on feet: Secondary | ICD-10-CM | POA: Diagnosis not present

## 2017-10-31 DIAGNOSIS — G4733 Obstructive sleep apnea (adult) (pediatric): Secondary | ICD-10-CM | POA: Diagnosis not present

## 2017-10-31 DIAGNOSIS — M6281 Muscle weakness (generalized): Secondary | ICD-10-CM | POA: Diagnosis not present

## 2017-10-31 NOTE — Progress Notes (Signed)
Subjective:    Patient ID: James Spurr, James Bell is a 84 y.o. male.  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 The patient is unaccompanied today. I last saw him on 10/25/2016, at which time he was using CPAP regularly. He had been receiving Botox injections for poststroke spasticity under Dr. Letta Pate. He was exercising on a regular basis. He had support system.   Today, 10/31/2017: I reviewed his CPAP compliance data from 09/29/2017 through 10/28/2017 which is a total of 30 days, during which time he used his machine 28 days with percent used days greater than 4 hours at 73%, indicating adequate compliance with an average usage of 5 hours and 20 minutes, residual AHI at 2 per hour, leak acceptable with the 95th percentile of 15.2 L/m on a pressure of 9 cm with EPR of 3. He reports doing well, feels stable, mobility with his right upper extremity is overall much better after regular Botox injections, he uses a hemiwalker on the left.  The patient's allergies, current medications, family history, past medical history, past social history, past surgical history and problem list were reviewed and updated as appropriate.    Previously (copied from previous notes for reference):   I saw him on 07/22/2015, at which time he was compliant with CPAP therapy. He was in the emergency room in June 2016 for right-sided weakness and had workup for TIA including echocardiogram, Doppler, EEG and MRI, workup negative for an acute stroke like events. He was not  drinking enough water and I encouraged him to be better hydrated.    I reviewed his CPAP compliance data from 07/25/2016 through 10/22/2016, which is a total of 90 days, during which time he used his machine 73 days with percent used days greater than 4 hours at 43%, indicating decline compliance and suboptimal compliance with an average usage for 4 hours and 15 minutes, residual AHI 2.6 per hour, leak acceptable with the 95th percentile at 12 L/m on a pressure of 9 cm with EPR I saw him on 01/19/2015, at which time he reported compliance with CPAP therapy. He preferred a CPAP pressure of 9 cm as opposed to 8 cm. He was exercising regularly. He was receiving Botox injections for his post stroke spasticity under Dr. Letta Pate with good results. He was using a cane on his left side for mobility.   I reviewed his CPAP compliance data from 06/20/2015 through 07/19/2015 which is a total of 30 days during which time he used his machine 27 days with percent used days greater than 4 hours at 70%, indicating adequate compliance with an average usage of 4 hours and 41 minutes, residual AHI low at 1.9 per hour, leak at times high with the 95th percentile at 28.5 L/m on a pressure of 9 cm with EPR of 3.   I saw him on 01/08/2014, at which time I suggested we bring him back for sleep study. He had a split-night sleep study on 01/13/2014 and we talked about his sleep test results and based on the test results I had prescribed CPAP therapy for him. His  sleep efficiency at baseline was 69.9% with a sleep latency of 15 minutes and wake after sleep onset of 21 minutes with severe sleep fragmentation noted. He had a markedly elevated arousal index. He had absence of slow-wave sleep and absence of REM sleep prior to CPAP. He had rare PVCs and PACs on EKG. He had no PLMS. He had mild intermittent snoring. He had a total AHI of 58.9 per hour, baseline oxygen saturation was 94%, nadir was 85% during non-REM sleep. He was titrated  on CPAP therapy for the rest of the night. Sleep efficiency was 70.1%. Wake after sleep onset was 79.5 minutes with mild to moderate sleep fragmentation noted. He achieved slow-wave sleep and REM sleep after CPAP. He was titrated from 5-8 cm. His AHI was 0 per hour on a pressure of 8. Supine REM sleep was achieved. Based on the test results are prescribed CPAP therapy at home. Of note, he no showed for an appointment on 01/14/2015.   I first met him on 04/04/2013 at which time I suggested an autoPAP trial for 30 days. His old CPAP machine was set for 10 cm, but was defective and he bought a new machine on his own, which was set at 7 cm, then increased to 9 cm, as I understand.   His typical bedtime is reported to be around 9 to 10 PM and usual wake time is around 5 AM. Sleep onset typically occurs within a few minutes. He reports feeling not well rested upon awakening. He wakes up on an average 2 in the middle of the night and has to go to the bathroom 2 times on a typical night. He denies recent morning headaches.   He reports excessive daytime somnolence (EDS) and His Epworth Sleepiness Score (ESS) is 15/24 today. He has been known to snore for the past many years. He had 2 sleep studies in the past and he has records today, which I reviewed: He had a baseline sleep study on 12/26/2007 at [facility] in [place], [place]. His overall AHI was 40 per hour. His oxygen nadir was 78%. Moderate to severe sleep fragmentation was noted and an increased arousal index. He had periodic leg movements of sleep. He had a CPAP titration study on 05/21/2008. He had periodic leg movements of sleep, oxygen nadir was 92% for the night.   He feels that the pressure is not adequate as he does not wake up rested and has daytime somnolence. He feels that he may be snoring despite using CPAP. He had another machine which could not be fixed. He goes to the gym and drives a car. He lives alone and cooks for  himself. He uses a 4 prong cane on the left side to mobilize. He is up for Botox injections in the right upper extremity.    His Past Medical History Is Significant For: Past Medical History:  Diagnosis Date  . Anemia   . Basal cell carcinoma 2018   [place]   . Basal cell carcinoma of nose 2002   bridge of nose  . BPH with obstruction/lower urinary tract symptoms    failed flomax, uroxatral, myrbetriq - sees urology [name]) pending videourodynamics  . CKD (chronic kidney disease) stage 3, GFR 30-59 ml/min (HCC) 03/11/2012   Renal US 02/2014 - multiple kidney cysts   . CVA (cerebral infarction) 03/07/12   slurred speech; right hemplegia, left handed - completed PT 07/2014 (17 sessions)  . Depression    mild, on cymbalta  per prior PCP  . Erectile dysfunction    per urology (prostaglandin injection)  . GERD (gastroesophageal reflux disease)    with sliding HH  . H/O hiatal hernia   . History of kidney problems 12/2010   lacerated left kidney after fall  . HLD (hyperlipidemia)   . Kidney cysts 02/2014   bilateral (renal US)  . Migraines 03/11/12   now rare  . OSA on CPAP    sleep study 01/2014 - severe, CPAP at 10, previously on nuvigil (Dr. [name])  . Palpitations 01/2012   holter WNL, rare PAC/PVCs  . Presbycusis of both ears 2016   rec amplification [facility])  . Restless leg syndrome    well controlled on mirapex  . Rosacea    [place]  . TIA (transient ischemic attack)   . Type 2 diabetes, controlled, with neuropathy [facility]) 2011   [name] in [place]    His Past Surgical History Is Significant For: Past Surgical History:  Procedure Laterality Date  . ABI  2007   WNL  . CARDIAC CATHETERIZATION  2004   normal; [place]  . CARDIOVASCULAR STRESS TEST  02/2014   WNL, low risk scan, EF 68%  . CAROTID ENDARTERECTOMY    . CATARACT EXTRACTION W/ INTRAOCULAR LENS IMPLANT  11//2012 - 08/2011  . COLONOSCOPY  03/2011   poor prep, unable to biopsy polyp in tic fold, rpt 3 mo  [name])  . COLONOSCOPY  06/2011   mult polyps adenomatous, severe diverticulosis, rpt 3 yrs [name])  . CYSTOSCOPY WITH URETHRAL DILATATION N/A 07/24/2013   Procedure: CYSTOSCOPY WITH URETHRAL DILATATION;  Surgeon: [name], James Bell;  Location: [facility];  Service: Urology;  Laterality: N/A;  . ESOPHAGOGASTRODUODENOSCOPY  2006   duodenitis, medual HH, Grade 2 reflux, mild gastritis  . ESOPHAGOGASTRODUODENOSCOPY  07/2011   mild chronic gastritis  . [place]  . [place]; 2002   left, then repaired  . MRI brain  04/2010   WNL  . PROSTATE SURGERY  02/2009   PVP (laser)  . SKIN CANCER EXCISION  2002   bridge of nose, [facility]  . TONSILLECTOMY AND ADENOIDECTOMY  ~ 1945  . TRANSURETHRAL INCISION OF PROSTATE N/A 07/24/2013   Procedure: TRANSURETHRAL INCISION OF THE PROSTATE (TUIP);  Surgeon: [name], James Bell;  Location: [facility];  Service: Urology;  Laterality: N/A;  . URETHRAL DILATION  2014   [place]    His Family History Is Significant For: Family History  Problem Relation Age of Onset  . Stroke Father   . Lung cancer Father 19        smoker  . Stroke Brother   . Diabetes Brother 63  . Lung cancer Brother   . Alcoholism Sister   . Kidney disease Sister   . Coronary artery disease Neg Hx     His Social History Is Significant For: Social History   Socioeconomic History  . Marital status: Unknown    Spouse name: None  . Number of children: 2  . Years of education: James Bell  . Highest education level: None  Social Needs  . Financial resource strain: None  . Food insecurity - worry: None  . Food insecurity - inability: None  . Transportation needs - medical: None  . Transportation needs - non-medical: None  Occupational History  . Occupation: retired    Fish farm manager: RETIRED  Tobacco Use  . Smoking status: Never Smoker  . Smokeless tobacco: Never Used  . Tobacco  comment: "used to smoke pipe when I was younger"  Substance and Sexual  Activity  . Alcohol use: Yes    Alcohol/week: 2.4 oz    Types: 2 Glasses of wine, 2 Shots of liquor per week    Comment: occasional  . Drug use: No  . Sexual activity: Yes  Other Topics Concern  . None  Social History Narrative   Caffeine: 1 cup coffee/day    Occupation: ophthalmologist, retired   Edu: James Bell   Activity: going to gym 3x/wk with trainer   Diet: some water, fruits/vegetables some, red meat a few times, fish 1x/wk   POA - [name] (Daughter)    His Allergies Are:  Allergies  Allergen Reactions  . Lisinopril Cough    tachycardia  . Nexium [Esomeprazole] Other (See Comments)    Headache  . Niacin And Related Other (See Comments)    flushing  :   His Current Medications Are:  Outpatient Encounter Medications as of 10/31/2017  Medication Sig  . clopidogrel (PLAVIX) 75 MG tablet TAKE 1 TABLET DAILY  . Coenzyme Q10 (COQ10) 150 MG CAPS Take 1 capsule by mouth 2 (two) times daily.  . CRESTOR 40 MG tablet TAKE 1 TABLET DAILY  . diazepam (VALIUM) 5 MG tablet Take 1 tablet (5 mg total) by mouth every 8 (eight) hours as needed for muscle spasms.  . diclofenac sodium (VOLTAREN) 1 % GEL Apply 2 g topically 4 (four) times daily.  . DULoxetine (CYMBALTA) 60 MG capsule Take 1 capsule (60 mg total) by mouth daily.  Marland Kitchen FOLBEE 2.5-25-1 MG TABS tablet TAKE 1 TABLET DAILY  . gabapentin (NEURONTIN) 300 MG capsule TAKE 1 CAPSULE TWICE A DAY (NEW DIRECTIONS)  . ibuprofen (ADVIL,MOTRIN) 200 MG tablet Take 400 mg by mouth every 6 (six) hours as needed for mild pain or moderate pain.  . Insulin Degludec (TRESIBA FLEXTOUCH) 200 UNIT/ML SOPN Inject 80 Units into the skin at bedtime.   . Insulin Pen Needle (NOVOFINE) 30G X 8 MM MISC Use to inject insulin once daily dx:E11.40  . levothyroxine (LEVOTHROID) 25 MCG tablet Take 1 tablet (25 mcg total) by mouth daily before breakfast. Per endo [name]  . losartan (COZAAR) 25 MG tablet Take 1 tablet (25 mg total) by mouth daily.  . metoprolol  tartrate (LOPRESSOR) 25 MG tablet TAKE ONE-HALF (1/2) TABLET TWICE A DAY  . nitroGLYCERIN (NITROSTAT) 0.4 MG SL tablet DISSOLVE 1 TABLET UNDER THE TONGUE EVERY 5 MINUTES AS NEEDED FOR CHEST PAIN  . omega-3 acid ethyl esters (LOVAZA) 1 g capsule Take 2 capsules (2 g total) by mouth 2 (two) times daily.  Marland Kitchen omeprazole (PRILOSEC) 40 MG capsule TAKE 1 CAPSULE DAILY  . pramipexole (MIRAPEX) 1 MG tablet Take two tablets at 3pm, extra tablet at bedtime as needed  . traZODone (DESYREL) 50 MG tablet Take 50 mg by mouth at bedtime as needed for sleep.  . vitamin C (ASCORBIC ACID) 500 MG tablet Take 1,000 mg by mouth daily.    No facility-administered encounter medications on file as of 10/31/2017.   :  Review of Systems:  Out of a complete 14 point review of systems, all are reviewed and negative with the exception of these symptoms as listed below: Review of Systems  Neurological:       Pt presents today to discuss his cpap. Pt reports that his cpap is going well.    Objective:  Neurological Exam  Physical Exam Physical Examination:   Vitals:   10/31/17 1313  BP: 120/60  Pulse: 70    General Examination: The patient is a very pleasant 84 y.o. male in no acute distress. He appears well-developed and well-nourished and well groomed. Good spirits.   HEENT: Normocephalic, atraumatic, pupils are equal, round and reactive to light and accommodation. Extraocular tracking is good without limitation to gaze excursion or nystagmus noted. Normal smooth pursuit is noted. Hearing is grossly intact. Fairly symmetric face, very mild right lower facial weakness noted, speech is minimally dysarthric and mildly hypophonic. No tremor is noted, neck is supple, oropharynx exam reveals mild mouth dryness, mild to moderate airway crowding.  Chest: Clear to auscultation without wheezing, rhonchi or crackles noted.  Heart: S1+S2+0, regular and normal without murmurs, rubs or gallops noted.   Abdomen: Soft,  non-tender and non-distended with normal bowel sounds appreciated on auscultation.  Extremities: There is trace edema in the right ankle, wearing an AFO on the right.  Skin: Warm and dry without trophic changes noted.  Musculoskeletal: exam reveals no obvious joint deformities, tenderness or joint swelling or erythema.   Neurologically:  Mental status: The patient is awake, alert and oriented in all 4 spheres. His immediate and remote memory, attention, language skills and fund of knowledge are appropriate. There is no evidence of aphasia, agnosia, apraxia or anomia. Speech is clear with normal prosody and enunciation. Thought process is linear. Mood is normal and affect is normal.  Cranial nerves II - XII are as described above under HEENT exam. In addition: shoulder shrug is normal with equal shoulder height noted. Motor exam: Normal bulk, strength and tone is noted on the left, on the right he has 4 out of 5 strength in the upper and lower extremities, tone is increased in the right upper and right lower extremity, but better in the RUE.  Romberg is not testable safely. Reflexes are mildly asymmetrical, right-sided hyperreflexia is noted. Fine motor skills  are preserved on the left, impaired on the right. He stands with difficulty and has a 4 pronged cane that he uses on the left, mild circumduction on R. sensory exam is intact to light touch .         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 He has been on CPAP of 9 cm, still good with his compliance and reports being up-to-date with his supplies. I placed another prescription to update his order for CPAP related supplies. He is commended for his treatments  excessive. He has achieved better mobility with time. He is commended for his CPAP adherence. I suggested a one-year checkup, sooner as needed. I answered all his questions today and he was in agreement.  I spent 20 minutes in total face-to-face time with the patient, more than 50% of which was spent in counseling and coordination of care, reviewing test results, reviewing medication and discussing or reviewing the diagnosis of OSA, its prognosis and treatment options. Pertinent laboratory and imaging test results that were available during this visit with the patient were reviewed by me and considered in my medical decision making (see chart for details).

## 2017-10-31 NOTE — Therapy (Signed)
[place] MAIN [facility] SERVICES [address] [place], [name], [address] Phone: [phone]   Fax:  [phone]  Physical Therapy Treatment  Patient Details  Name: James BENNIS, MD MRN: [id number] Date of Birth: 07/04/1931 Referring Provider: Charlett Blake    Encounter Date: 10/31/2017  PT End of Session - 10/31/17 0849    Visit Number  9    Number of Visits  25    Date for PT Re-Evaluation  12/19/17    PT Start Time  0845    PT Stop Time  0930    PT Time Calculation (min)  45 min    Equipment Utilized During Treatment  Gait belt    Activity Tolerance  Patient limited by fatigue;Patient tolerated treatment well    Behavior During Therapy  [facility] for tasks assessed/performed       Past Medical History:  Diagnosis Date  . Anemia   . Basal cell carcinoma 2018   R temple   . Basal cell carcinoma of nose 2002   bridge of nose  . BPH with obstruction/lower urinary tract symptoms    failed flomax, uroxatral, myrbetriq - sees urology [name]) pending videourodynamics  . CKD (chronic kidney disease) stage 3, GFR 30-59 ml/min (HCC) 03/11/2012   Renal US 02/2014 - multiple kidney cysts   . CVA (cerebral infarction) 03/07/12   slurred speech; right hemplegia, left handed - completed PT 07/2014 (17 sessions)  . Depression    mild, on cymbalta per prior PCP  . Erectile dysfunction    per urology (prostaglandin injection)  . GERD (gastroesophageal reflux disease)    with sliding HH  . H/O hiatal hernia   . History of kidney problems 12/2010   lacerated left kidney after fall  . HLD (hyperlipidemia)   . Kidney cysts 02/2014   bilateral (renal US)  . Migraines 03/11/12   now rare  . OSA on CPAP    sleep study 01/2014 - severe, CPAP at 10, previously on nuvigil (Dr. [name])  . Palpitations 01/2012   holter WNL, rare PAC/PVCs  . Presbycusis of both ears 2016   rec amplification [facility])  . Restless leg syndrome    well  controlled on mirapex  . Rosacea    Kowalski  . TIA (transient ischemic attack)   . Type 2 diabetes, controlled, with neuropathy [facility]) 2011   [name] in [facility]    Past Surgical History:  Procedure Laterality Date  . ABI  2007   WNL  . CARDIAC CATHETERIZATION  2004   normal; [place] [facility]  . CARDIOVASCULAR STRESS TEST  02/2014   WNL, low risk scan, EF 68%  . CAROTID ENDARTERECTOMY    . CATARACT EXTRACTION W/ INTRAOCULAR LENS IMPLANT  11//2012 - 08/2011  . COLONOSCOPY  03/2011   poor prep, unable to biopsy polyp in tic fold, rpt 3 mo [name])  . COLONOSCOPY  06/2011   mult polyps adenomatous, severe diverticulosis, rpt 3 yrs [name])  . CYSTOSCOPY WITH URETHRAL DILATATION N/A 07/24/2013   Procedure: CYSTOSCOPY WITH URETHRAL DILATATION;  Surgeon: [name], MD;  Location: [facility];  Service: Urology;  Laterality: N/A;  . ESOPHAGOGASTRODUODENOSCOPY  2006   duodenitis, medual HH, Grade 2 reflux, mild gastritis  . ESOPHAGOGASTRODUODENOSCOPY  07/2011   mild chronic gastritis  . [place]  . [place]; 2002   left, then repaired  . MRI brain  04/2010   WNL  . PROSTATE SURGERY  02/2009   PVP (laser)  . SKIN CANCER EXCISION  2002   bridge of nose, BCC  . TONSILLECTOMY AND ADENOIDECTOMY  ~ 1945  . TRANSURETHRAL INCISION OF PROSTATE N/A 07/24/2013   Procedure: TRANSURETHRAL INCISION OF THE PROSTATE (TUIP);  Surgeon: [name], MD;  Location: [facility];  Service: Urology;  Laterality: N/A;  . URETHRAL DILATION  2014   tannenbaum    There were no vitals filed for this visit.  Subjective Assessment - 10/31/17 0847    Subjective  Patient has been trying the pelvic tilt in sitting to reduce his back pain that increases in standing.     Pertinent History  Patinet had CVA March 11 2012. He had rehab with hospital and SNF and then outpatient therapy. He wears a lumbar corset to decrease his back pain if he is standing.     How long can you sit  comfortably?  unlimited    How long can you stand comfortably?  10 or 15 mins    How long can you walk comfortably?  15 mins using a rollator outdoors.    Patient Stated Goals  Patient wants to be able to stand for longer periods and walk for longer periods.    Currently in Pain?  No/denies    Pain Score  0-No pain    Pain Onset  Today    Multiple Pain Sites  No      Treatment: Side stepping on TM elevation 2 and speed . 3 miles / hour, cues to keep his feet from getting too close to the edge and to keep RLE from ER  Toe taps to 6 inch stool with BUE x 15 without UE support and min assist  Rocker board fwd/ bwd, side to side x 20 each way and UE assist , cues to keep his knees straight  Step overs 1/2 foam side stepping x 20  With UE support and cues for posture  Fwd stepping over bolster without UE support x 10 , fatigue with LLE  Hip hike left and right x 10 x 2 , min assist to keep stabilized leg straight with knee support                        PT Education - 10/31/17 0849    Education provided  Yes    Education Details  HEP review    Person(s) Educated  Patient    Methods  Explanation;Demonstration    Comprehension  Verbalized understanding;Returned demonstration       PT Short Term Goals - 10/24/17 0858      PT SHORT TERM GOAL #1   Title  Patient will ascend and descend step without railing and use of hemiwalker without loss of balance to be able to safely get into his house.    Baseline  Patient continues to use the railing with steps    Time  6    Period  Weeks    Status  On-going    Target Date  11/14/17        PT Long Term Goals - 10/24/17 0859      PT LONG TERM GOAL #1   Title  Patient will be able to stand for 1 hour without increase in pain to demonstrate improved tolerance for ADLs.     Baseline  15 mins    Period  Weeks    Status  On-going    Target Date  12/20/99  PT LONG TERM GOAL #2   Title  Patient will reduce timed  up and go to <11 seconds to reduce fall risk and demonstrate improved transfer/gait ability.    Baseline  26 sec, 10/24/17 18.83 sec    Time  12    Period  Weeks    Status  On-going      PT LONG TERM GOAL #3   Title  Patient (> 82 years old) will complete five times sit to stand test in < 15 seconds indicating an increased LE strength and improved balance.    Baseline  5x sit to stand in 25.73 seconds. , 22.63 sec 10/24/17    Time  12    Period  Weeks    Status  On-going    Target Date  12/19/17      PT LONG TERM GOAL #4   Title  Patient will increase six minute walk test distance to >600 for progression to community ambulator and improve gait ability    Baseline  474 ft, 710 feet    Time  12    Period  Weeks    Status  On-going    Target Date  12/19/17            Plan - 10/31/17 0850    Clinical Impression Statement  Pt requires direction and verbal cues for correct performance of  standing dynamic balance  exercises. Patient has fatigue with endurance  and difficulty with weight shift.   Patient struggles with speed during movement as well as balance with unstable surfaces. Pt encouraged to continue HEP   Patient will benefit from continued skilled PT to improve mobility and safety.    Rehab Potential  Good    PT Frequency  2x / week    PT Duration  12 weeks    PT Treatment/Interventions  Dry needling;Manual techniques;Balance training;Neuromuscular re-education;Patient/family education;Therapeutic exercise;Therapeutic activities;Functional mobility training;Stair training;Gait training;Moist Heat;Cryotherapy;Electrical Stimulation    PT Next Visit Plan  add to HEP, balance training    Consulted and Agree with Plan of Care  Patient       Patient will benefit from skilled therapeutic intervention in order to improve the following deficits and impairments:  Abnormal gait, Decreased balance, Decreased endurance, Decreased mobility, Difficulty walking, Impaired sensation, Impaired  tone, Decreased activity tolerance, Decreased strength, Decreased safety awareness, Pain  Visit Diagnosis: Muscle weakness  Abnormality of gait  Unsteadiness     Problem List Patient Active Problem List   Diagnosis Date Noted  . Rosacea   . Gait disturbance, post-stroke 10/05/2015  . Presbycusis of both ears   . Advanced care planning/counseling discussion 06/23/2015  . Impaired mobility 04/23/2015  . HTN (hypertension) 03/18/2015  . Chronic diastolic heart failure, NYHA class 1 ([place]) 03/18/2015  . Spastic hemiparesis affecting dominant side ([place]) 07/07/2014  . Chest pressure 02/03/2014  . Medicare annual wellness visit, subsequent 11/06/2013  . Erectile dysfunction due to arterial insufficiency 02/05/2013  . MDD (major depressive disorder), recurrent episode, moderate ([place])   . Palpitations 06/26/2012  . HLD (hyperlipidemia)   . Restless leg syndrome   . GERD (gastroesophageal reflux disease)   . BPH with obstruction/lower urinary tract symptoms   . History of CVA (cerebrovascular accident) 03/11/2012  . Hemiparesis affecting right side as late effect of stroke ([place]) 03/11/2012  . Type 2 diabetes, controlled, with neuropathy ([place]) 03/11/2012  . OSA on CPAP 03/11/2012  . CKD (chronic kidney disease) stage 3, GFR 30-59 ml/min (HCC) 03/11/2012  . Migraines  03/11/2012    Alanson Puls, PT DPT 10/31/2017, 9:27 AM  [place] MAIN [facility] SERVICES [address] [place], [name], [address] Phone: [phone]   Fax:  [phone]  Name: FARRIS GEIMAN, MD MRN: [id number] Date of Birth: Nov 07, 1930

## 2017-10-31 NOTE — Patient Instructions (Addendum)
Please continue using your CPAP regularly. While your insurance requires that you use CPAP at least 4 hours each night on 70% of the nights, I recommend, that you not skip any nights and use it throughout the night if you can. Getting used to CPAP and staying with the treatment long term does take time and patience and discipline. Untreated obstructive sleep apnea when it is moderate to severe can have an adverse impact on cardiovascular health and raise her risk for heart disease, arrhythmias, hypertension, congestive heart failure, stroke and diabetes. Untreated obstructive sleep apnea causes sleep disruption, nonrestorative sleep, and sleep deprivation. This can have an impact on your day to day functioning and cause daytime sleepiness and impairment of cognitive function, memory loss, mood disturbance, and problems focussing. Using CPAP regularly can improve these symptoms. Keep up the good work! We will see you back in one year for sleep apnea check up.  You do not have to bring your CPAP machine next time. You look well!

## 2017-11-02 ENCOUNTER — Ambulatory Visit (HOSPITAL_BASED_OUTPATIENT_CLINIC_OR_DEPARTMENT_OTHER): Payer: Medicare Other | Admitting: Physical Medicine & Rehabilitation

## 2017-11-02 ENCOUNTER — Encounter: Payer: Self-pay | Admitting: Physical Medicine & Rehabilitation

## 2017-11-02 ENCOUNTER — Encounter: Payer: Medicare Other | Attending: Physical Medicine & Rehabilitation

## 2017-11-02 VITALS — BP 125/70 | HR 66

## 2017-11-02 DIAGNOSIS — R252 Cramp and spasm: Secondary | ICD-10-CM | POA: Insufficient documentation

## 2017-11-02 DIAGNOSIS — G8110 Spastic hemiplegia affecting unspecified side: Secondary | ICD-10-CM | POA: Diagnosis not present

## 2017-11-02 NOTE — Progress Notes (Signed)
RUE Botox Injection for spasticity using needle EMG guidance  Dilution: 200 Units/ml Indication: Severe spasticity which interferes with ADL,mobility and/or  hygiene and is unresponsive to medication management and other conservative care Informed consent was obtained after describing risks and benefits of the procedure with the patient. This includes bleeding, bruising, infection, excessive weakness, or medication side effects. A REMS form is on file and signed. Needle: 27g 1" needle electrode Number of units per muscle  Biceps75 FCR25  FDS25  Lumbrical 1,2 and 3, 12.5 Units each=37.5  FDP with estim to Identify DIP flexion digits 2 and 3, 12.5 units injected  All injections were done after obtaining appropriate EMG activity and after negative drawback for blood. The patient tolerated the procedure well. Post procedure instructions were given. A followup appointment was made.   Patient complained of right dorsum of the foot numbness and burning at night.  He is a diabetic.  He is currently taking gabapentin 1 tablet 300 mg at 3 PM and 1 tablet at at bedtime.  Recommend that he increases to 1 tablet at 3 PM and 2 tablets nightly.  If this is helpful we will need to rewrite the prescription

## 2017-11-02 NOTE — Patient Instructions (Signed)
You received a Botox injection today. You may experience soreness at the needle injection sites. Please call us if any of the injection sites turns red after a couple days or if there is any drainage. You may experience muscle weakness as a result of Botox. This would improve with time but can take several weeks to improve. The Botox should start working in about one week. The Botox usually last 3 months. The injection can be repeated every 3 months as needed.  Try taking 2 gabapentin capsules at bedtime

## 2017-11-05 ENCOUNTER — Ambulatory Visit: Payer: Medicare Other | Admitting: Physical Therapy

## 2017-11-05 DIAGNOSIS — R2681 Unsteadiness on feet: Secondary | ICD-10-CM | POA: Diagnosis not present

## 2017-11-05 DIAGNOSIS — R269 Unspecified abnormalities of gait and mobility: Secondary | ICD-10-CM | POA: Diagnosis not present

## 2017-11-05 DIAGNOSIS — M6281 Muscle weakness (generalized): Secondary | ICD-10-CM

## 2017-11-05 NOTE — Therapy (Signed)
[place] MAIN [facility] SERVICES [place], [name], [address] Phone: [phone]   Fax:  [phone]  Physical Therapy Treatment  Patient Details  Name: James BRUCATO, James Bell MRN: [id number] Date of Birth: 04/15/1931 Referring Provider: Charlett Blake    Encounter Date: 11/05/2017  PT End of Session - 11/05/17 0914    Visit Number  10    Number of Visits  25    Date for PT Re-Evaluation  12/19/17    PT Start Time  0845    PT Stop Time  0930    PT Time Calculation (min)  45 min    Equipment Utilized During Treatment  Gait belt    Activity Tolerance  Patient limited by fatigue;Patient tolerated treatment well    Behavior During Therapy  [facility] for tasks assessed/performed       Past Medical History:  Diagnosis Date  . Anemia   . Basal cell carcinoma 2018   R temple   . Basal cell carcinoma of nose 2002   bridge of nose  . BPH with obstruction/lower urinary tract symptoms    failed flomax, uroxatral, myrbetriq - sees urology [name]) pending videourodynamics  . CKD (chronic kidney disease) stage 3, GFR 30-59 ml/min (HCC) 03/11/2012   Renal US 02/2014 - multiple kidney cysts   . CVA (cerebral infarction) 03/07/12   slurred speech; right hemplegia, left handed - completed PT 07/2014 (17 sessions)  . Depression    mild, on cymbalta per prior PCP  . Erectile dysfunction    per urology (prostaglandin injection)  . GERD (gastroesophageal reflux disease)    with sliding HH  . H/O hiatal hernia   . History of kidney problems 12/2010   lacerated left kidney after fall  . HLD (hyperlipidemia)   . Kidney cysts 02/2014   bilateral (renal US)  . Migraines 03/11/12   now rare  . OSA on CPAP    sleep study 01/2014 - severe, CPAP at 10, previously on nuvigil (Dr. [name])  . Palpitations 01/2012   holter WNL, rare PAC/PVCs  . Presbycusis of both ears 2016   rec amplification [facility])  . Restless leg syndrome    well  controlled on mirapex  . Rosacea    Kowalski  . TIA (transient ischemic attack)   . Type 2 diabetes, controlled, with neuropathy [facility]) 2011   [name] in [facility]    Past Surgical History:  Procedure Laterality Date  . ABI  2007   WNL  . CARDIAC CATHETERIZATION  2004   normal; [place] [facility]  . CARDIOVASCULAR STRESS TEST  02/2014   WNL, low risk scan, EF 68%  . CAROTID ENDARTERECTOMY    . CATARACT EXTRACTION W/ INTRAOCULAR LENS IMPLANT  11//2012 - 08/2011  . COLONOSCOPY  03/2011   poor prep, unable to biopsy polyp in tic fold, rpt 3 mo [name])  . COLONOSCOPY  06/2011   mult polyps adenomatous, severe diverticulosis, rpt 3 yrs [name])  . CYSTOSCOPY WITH URETHRAL DILATATION N/A 07/24/2013   Procedure: CYSTOSCOPY WITH URETHRAL DILATATION;  Surgeon: [name], James Bell;  [facility];  Service: Urology;  Laterality: N/A;  . ESOPHAGOGASTRODUODENOSCOPY  2006   duodenitis, medual HH, Grade 2 reflux, mild gastritis  . ESOPHAGOGASTRODUODENOSCOPY  07/2011   mild chronic gastritis  . [place]  . [place]; 2002   left, then repaired  . MRI brain  04/2010   WNL  . PROSTATE SURGERY  02/2009   PVP (laser)  . SKIN CANCER EXCISION  2002   bridge of nose, BCC  . TONSILLECTOMY AND ADENOIDECTOMY  ~ 1945  . TRANSURETHRAL INCISION OF PROSTATE N/A 07/24/2013   Procedure: TRANSURETHRAL INCISION OF THE PROSTATE (TUIP);  Surgeon: [name], James Bell;  [facility];  Service: Urology;  Laterality: N/A;  . URETHRAL DILATION  2014   tannenbaum    There were no vitals filed for this visit.  Subjective Assessment - 11/05/17 0913    Subjective  Patient has been doing his exercises and feels that he is beginning to get better and also feels that he has a good routine with the exericses.     Pertinent History  Patinet had CVA March 11 2012. He had rehab with hospital and SNF and then outpatient therapy. He wears a lumbar corset to decrease his back pain if he is  standing.     How long can you sit comfortably?  unlimited    How long can you stand comfortably?  10 or 15 mins    How long can you walk comfortably?  15 mins using a rollator outdoors.    Patient Stated Goals  Patient wants to be able to stand for longer periods and walk for longer periods.    Currently in Pain?  No/denies    Pain Score  0-No pain    Pain Onset  Today    Multiple Pain Sites  No       Treatment:  Matrix fwd/ bwd 17. 5 lbs x 3 reps, side to side 12. 5 x 1 lap, min assist and loss of balance 25%  Tandem stand and cones across midline in parallel bars with LUE x 25, CGA and 10% LOB  Tandem stand , modified on purple foam and head turns x 20 with LOB towards left side , 50% LOB  Fwd/stepping and bwd stepping over 1/2 foam x 5 UE support 50%  Side stepping over 1/2 foam x 5 in parallel bars with UE support 25-50%  CGA and Min to mod verbal cues used throughout with increased in postural sway and LOB most seen with narrow base of support and while on uneven surfaces. Continues to have balance deficits typical with diagnosis. Patient performs intermediate level exercises without pain behaviors and needs verbal cuing for postural alignment and head positioning Tactile cues and assistance needed to keep lower leg and knee in neutral to avoid compensations with ankle motions.                       PT Education - 11/05/17 0913    Education provided  Yes    Education Details  balance strageties     Person(s) Educated  Patient    Methods  Explanation;Demonstration    Comprehension  Verbalized understanding;Returned demonstration       PT Short Term Goals - 10/24/17 0858      PT SHORT TERM GOAL #1   Title  Patient will ascend and descend step without railing and use of hemiwalker without loss of balance to be able to safely get into his house.    Baseline  Patient continues to use the railing with steps    Time  6    Period  Weeks    Status  On-going     Target Date  11/14/17        PT Long Term Goals - 10/24/17 0859      PT LONG  TERM GOAL #1   Title  Patient will be able to stand for 1 hour without increase in pain to demonstrate improved tolerance for ADLs.     Baseline  15 mins    Period  Weeks    Status  On-going    Target Date  12/20/99      PT LONG TERM GOAL #2   Title  Patient will reduce timed up and go to <11 seconds to reduce fall risk and demonstrate improved transfer/gait ability.    Baseline  26 sec, 10/24/17 18.83 sec    Time  12    Period  Weeks    Status  On-going      PT LONG TERM GOAL #3   Title  Patient (> 33 years old) will complete five times sit to stand test in < 15 seconds indicating an increased LE strength and improved balance.    Baseline  5x sit to stand in 25.73 seconds. , 22.63 sec 10/24/17    Time  12    Period  Weeks    Status  On-going    Target Date  12/19/17      PT LONG TERM GOAL #4   Title  Patient will increase six minute walk test distance to >600 for progression to community ambulator and improve gait ability    Baseline  474 ft, 710 feet    Time  12    Period  Weeks    Status  On-going    Target Date  12/19/17            Plan - 11/05/17 0914    Clinical Impression Statement  Patient presents with decreased gait speed, decreased balance, and decreased BLE strength. Patient's main complaint is BLE weakness and inability to participate in desired activities. Patient wants to improve his balance and ability to ambulate on inclines and outdoor surfaces safely. Patient will benefit from skilled PT in order to increase gait speed, increase BLE strength, and improve dynamic standing balance to decrease risk for falls and enable patient to participate in desired activities.    Rehab Potential  Good    PT Frequency  2x / week    PT Duration  12 weeks    PT Treatment/Interventions  Dry needling;Manual techniques;Balance training;Neuromuscular re-education;Patient/family  education;Therapeutic exercise;Therapeutic activities;Functional mobility training;Stair training;Gait training;Moist Heat;Cryotherapy;Electrical Stimulation    PT Next Visit Plan  add to HEP, balance training    Consulted and Agree with Plan of Care  Patient       Patient will benefit from skilled therapeutic intervention in order to improve the following deficits and impairments:  Abnormal gait, Decreased balance, Decreased endurance, Decreased mobility, Difficulty walking, Impaired sensation, Impaired tone, Decreased activity tolerance, Decreased strength, Decreased safety awareness, Pain  Visit Diagnosis: Muscle weakness  Abnormality of gait  Unsteadiness     Problem List Patient Active Problem List   Diagnosis Date Noted  . Rosacea   . Gait disturbance, post-stroke 10/05/2015  . Presbycusis of both ears   . Advanced care planning/counseling discussion 06/23/2015  . Impaired mobility 04/23/2015  . HTN (hypertension) 03/18/2015  . Chronic diastolic heart failure, NYHA class 1 ([place]) 03/18/2015  . Spastic hemiparesis affecting dominant side ([place]) 07/07/2014  . Chest pressure 02/03/2014  . Medicare annual wellness visit, subsequent 11/06/2013  . Erectile dysfunction due to arterial insufficiency 02/05/2013  . MDD (major depressive disorder), recurrent episode, moderate ([place])   . Palpitations 06/26/2012  . HLD (hyperlipidemia)   . Restless leg syndrome   .  GERD (gastroesophageal reflux disease)   . BPH with obstruction/lower urinary tract symptoms   . History of CVA (cerebrovascular accident) 03/11/2012  . Hemiparesis affecting right side as late effect of stroke ([place]) 03/11/2012  . Type 2 diabetes, controlled, with neuropathy ([place]) 03/11/2012  . OSA on CPAP 03/11/2012  . CKD (chronic kidney disease) stage 3, GFR 30-59 ml/min (HCC) 03/11/2012  . Migraines 03/11/2012    Alanson Puls, PT DPT 11/05/2017, 9:23 AM  [place] MAIN  [facility] SERVICES [address] [place], [name], [address] Phone: [phone]   Fax:  [phone]  Name: James AVANS, James Bell MRN: [id number] Date of Birth: 1930-11-25

## 2017-11-07 ENCOUNTER — Encounter: Payer: Self-pay | Admitting: Physical Therapy

## 2017-11-07 ENCOUNTER — Ambulatory Visit: Payer: Medicare Other | Admitting: Physical Therapy

## 2017-11-07 DIAGNOSIS — R2681 Unsteadiness on feet: Secondary | ICD-10-CM

## 2017-11-07 DIAGNOSIS — M6281 Muscle weakness (generalized): Secondary | ICD-10-CM | POA: Diagnosis not present

## 2017-11-07 DIAGNOSIS — R269 Unspecified abnormalities of gait and mobility: Secondary | ICD-10-CM

## 2017-11-07 NOTE — Therapy (Addendum)
[place] MAIN [facility] SERVICES [address] [place], [name], [address] Phone: [phone]   Fax:  [phone]  Physical Therapy Treatment  Patient Details  Name: James BUTT, MD MRN: [id number] Date of Birth: Dec 18, 1930 Referring Provider: Charlett Blake    Encounter Date: 11/07/2017  PT End of Session - 11/07/17 0918    Visit Number  11    Number of Visits  25    Date for PT Re-Evaluation  12/19/17    PT Start Time  0850    PT Stop Time  0930    PT Time Calculation (min)  40 min    Equipment Utilized During Treatment  Gait belt    Activity Tolerance  Patient limited by fatigue;Patient tolerated treatment well    Behavior During Therapy  [facility] for tasks assessed/performed       Past Medical History:  Diagnosis Date  . Anemia   . Basal cell carcinoma 2018   R temple   . Basal cell carcinoma of nose 2002   bridge of nose  . BPH with obstruction/lower urinary tract symptoms    failed flomax, uroxatral, myrbetriq - sees urology [name]) pending videourodynamics  . CKD (chronic kidney disease) stage 3, GFR 30-59 ml/min (HCC) 03/11/2012   Renal US 02/2014 - multiple kidney cysts   . CVA (cerebral infarction) 03/07/12   slurred speech; right hemplegia, left handed - completed PT 07/2014 (17 sessions)  . Depression    mild, on cymbalta per prior PCP  . Erectile dysfunction    per urology (prostaglandin injection)  . GERD (gastroesophageal reflux disease)    with sliding HH  . H/O hiatal hernia   . History of kidney problems 12/2010   lacerated left kidney after fall  . HLD (hyperlipidemia)   . Kidney cysts 02/2014   bilateral (renal US)  . Migraines 03/11/12   now rare  . OSA on CPAP    sleep study 01/2014 - severe, CPAP at 10, previously on nuvigil (Dr. [name])  . Palpitations 01/2012   holter WNL, rare PAC/PVCs  . Presbycusis of both ears 2016   rec amplification [facility])  . Restless leg syndrome    well  controlled on mirapex  . Rosacea    Kowalski  . TIA (transient ischemic attack)   . Type 2 diabetes, controlled, with neuropathy [facility]) 2011   [name] in [facility]    Past Surgical History:  Procedure Laterality Date  . ABI  2007   WNL  . CARDIAC CATHETERIZATION  2004   normal; [place] [facility]  . CARDIOVASCULAR STRESS TEST  02/2014   WNL, low risk scan, EF 68%  . CAROTID ENDARTERECTOMY    . CATARACT EXTRACTION W/ INTRAOCULAR LENS IMPLANT  11//2012 - 08/2011  . COLONOSCOPY  03/2011   poor prep, unable to biopsy polyp in tic fold, rpt 3 mo [name])  . COLONOSCOPY  06/2011   mult polyps adenomatous, severe diverticulosis, rpt 3 yrs [name])  . CYSTOSCOPY WITH URETHRAL DILATATION N/A 07/24/2013   Procedure: CYSTOSCOPY WITH URETHRAL DILATATION;  Surgeon: [name], MD;  Location: [facility];  Service: Urology;  Laterality: N/A;  . ESOPHAGOGASTRODUODENOSCOPY  2006   duodenitis, medual HH, Grade 2 reflux, mild gastritis  . ESOPHAGOGASTRODUODENOSCOPY  07/2011   mild chronic gastritis  . [place]  . [place]; 2002   left, then repaired  . MRI brain  04/2010   WNL  . PROSTATE SURGERY  02/2009   PVP (laser)  . SKIN CANCER EXCISION  2002   bridge of nose, BCC  . TONSILLECTOMY AND ADENOIDECTOMY  ~ 1945  . TRANSURETHRAL INCISION OF PROSTATE N/A 07/24/2013   Procedure: TRANSURETHRAL INCISION OF THE PROSTATE (TUIP);  Surgeon: [name], MD;  Location: [facility];  Service: Urology;  Laterality: N/A;  . URETHRAL DILATION  2014   tannenbaum    There were no vitals filed for this visit.  Subjective Assessment - 11/07/17 0917    Subjective  Patient has been doing his exercises and feels that he is beginning to get better and also feels that he has a good routine with the exericses.     Pertinent History  Patinet had CVA March 11 2012. He had rehab with hospital and SNF and then outpatient therapy. He wears a lumbar corset to decrease his back pain if he is  standing.     How long can you sit comfortably?  unlimited    How long can you stand comfortably?  10 or 15 mins    How long can you walk comfortably?  15 mins using a rollator outdoors.    Patient Stated Goals  Patient wants to be able to stand for longer periods and walk for longer periods.    Currently in Pain?  No/denies    Pain Score  0-No pain    Pain Onset  Today    Multiple Pain Sites  No      Treatment: Nu-step x 5 mins   Eccentric step down from low step x 5 each LE x 3 sets    NMR:  Hip hikes from stool x 5 left and x 5 right , cues for correct technique  Tandem stand on floor x 3 mins, shifting weight fwd/ bwd  Rocker board fwd/ bwd and side to side x 3 mins   Matrix fwd/ bwd x 5 and side stepping left and right x 10 ,  12. 5 lbs  1/2 foam stand with trunk rotation left and right x 20  1/2 foam flat side down with head turns x 20   Side stepping on balance foam left and right x 5 lengths of the bars                         PT Education - 11/07/17 0917    Education provided  Yes    Education Details  balance strageties    Person(s) Educated  Patient    Methods  Explanation;Demonstration;Tactile cues;Verbal cues    Comprehension  Verbalized understanding;Returned demonstration;Tactile cues required;Verbal cues required       PT Short Term Goals - 10/24/17 0858      PT SHORT TERM GOAL #1   Title  Patient will ascend and descend step without railing and use of hemiwalker without loss of balance to be able to safely get into his house.    Baseline  Patient continues to use the railing with steps    Time  6    Period  Weeks    Status  On-going    Target Date  11/14/17        PT Long Term Goals - 10/24/17 0859      PT LONG TERM GOAL #1   Title  Patient will be able to stand for 1 hour without increase in pain to demonstrate improved tolerance for ADLs.     Baseline  15 mins    Period  Weeks    Status  On-going    Target  Date  12/20/99      PT LONG TERM GOAL #2   Title  Patient will reduce timed up and go to <11 seconds to reduce fall risk and demonstrate improved transfer/gait ability.    Baseline  26 sec, 10/24/17 18.83 sec    Time  12    Period  Weeks    Status  On-going      PT LONG TERM GOAL #3   Title  Patient (> 29 years old) will complete five times sit to stand test in < 15 seconds indicating an increased LE strength and improved balance.    Baseline  5x sit to stand in 25.73 seconds. , 22.63 sec 10/24/17    Time  12    Period  Weeks    Status  On-going    Target Date  12/19/17      PT LONG TERM GOAL #4   Title  Patient will increase six minute walk test distance to >600 for progression to community ambulator and improve gait ability    Baseline  474 ft, 710 feet    Time  12    Period  Weeks    Status  On-going    Target Date  12/19/17            Plan - 11/07/17 4431    Clinical Impression Statement  Patient demonstrates quad weakness, and impaired balance that is causing difficulty with weight shifting and dynamic activities.  Patient has shown mild improvement with strengthening exercises but still fatigues quickly due to weakness in LE's. Patient will continue to benefit from skilled physical therapy to improve LE strength and dynamic balance to decrease risk of falls and improve mobility.    Rehab Potential  Good    PT Frequency  2x / week    PT Treatment/Interventions  Dry needling;Manual techniques;Balance training;Neuromuscular re-education;Patient/family education;Therapeutic exercise;Therapeutic activities;Functional mobility training;Stair training;Gait training;Moist Heat;Cryotherapy;Electrical Stimulation    PT Next Visit Plan  add to HEP, balance training    Consulted and Agree with Plan of Care  Patient       Patient will benefit from skilled therapeutic intervention in order to improve the following deficits and impairments:  Abnormal gait, Decreased balance, Decreased  endurance, Decreased mobility, Difficulty walking, Impaired sensation, Impaired tone, Decreased activity tolerance, Decreased strength, Decreased safety awareness, Pain  Visit Diagnosis: Muscle weakness  Abnormality of gait  Unsteadiness     Problem List Patient Active Problem List   Diagnosis Date Noted  . Rosacea   . Gait disturbance, post-stroke 10/05/2015  . Presbycusis of both ears   . Advanced care planning/counseling discussion 06/23/2015  . Impaired mobility 04/23/2015  . HTN (hypertension) 03/18/2015  . Chronic diastolic heart failure, NYHA class 1 ([place]) 03/18/2015  . Spastic hemiparesis affecting dominant side ([place]) 07/07/2014  . Chest pressure 02/03/2014  . Medicare annual wellness visit, subsequent 11/06/2013  . Erectile dysfunction due to arterial insufficiency 02/05/2013  . MDD (major depressive disorder), recurrent episode, moderate ([place])   . Palpitations 06/26/2012  . HLD (hyperlipidemia)   . Restless leg syndrome   . GERD (gastroesophageal reflux disease)   . BPH with obstruction/lower urinary tract symptoms   . History of CVA (cerebrovascular accident) 03/11/2012  . Hemiparesis affecting right side as late effect of stroke ([place]) 03/11/2012  . Type 2 diabetes, controlled, with neuropathy ([place]) 03/11/2012  . OSA on CPAP 03/11/2012  . CKD (chronic kidney disease) stage  3, GFR 30-59 ml/min ([place]) 03/11/2012  . Migraines 03/11/2012    Alanson Puls, PT DPT 11/07/2017, 9:21 AM  [place] MAIN [facility] SERVICES [address] [place], [name], [address] Phone: [phone]   Fax:  [phone]  Name: KHRIZ LIDDY, MD MRN: [id number] Date of Birth: [birth year]

## 2017-11-12 ENCOUNTER — Ambulatory Visit: Payer: Medicare Other

## 2017-11-12 VITALS — BP 124/71 | HR 73

## 2017-11-12 DIAGNOSIS — R2681 Unsteadiness on feet: Secondary | ICD-10-CM

## 2017-11-12 DIAGNOSIS — M6281 Muscle weakness (generalized): Secondary | ICD-10-CM | POA: Diagnosis not present

## 2017-11-12 DIAGNOSIS — R269 Unspecified abnormalities of gait and mobility: Secondary | ICD-10-CM | POA: Diagnosis not present

## 2017-11-12 NOTE — Therapy (Signed)
[place] MAIN [facility] SERVICES [address] [place], [name], [address] Phone: [phone]   Fax:  [phone]  Physical Therapy Treatment  Patient Details  Name: James RABENOLD, James Bell MRN: [id number] Date of Birth: December 16, 1930 Referring Provider: Charlett Blake    Encounter Date: 11/12/2017  PT End of Session - 11/12/17 1045    Visit Number  12    Number of Visits  25    Date for PT Re-Evaluation  12/19/17    PT Start Time  0920    PT Stop Time  1005    PT Time Calculation (min)  45 min    Equipment Utilized During Treatment  Gait belt    Activity Tolerance  Patient limited by fatigue;Patient tolerated treatment well    Behavior During Therapy  [facility] for tasks assessed/performed       Past Medical History:  Diagnosis Date  . Anemia   . Basal cell carcinoma 2018   R temple   . Basal cell carcinoma of nose 2002   bridge of nose  . BPH with obstruction/lower urinary tract symptoms    failed flomax, uroxatral, myrbetriq - sees urology [name]) pending videourodynamics  . CKD (chronic kidney disease) stage 3, GFR 30-59 ml/min (HCC) 03/11/2012   Renal US 02/2014 - multiple kidney cysts   . CVA (cerebral infarction) 03/07/12   slurred speech; right hemplegia, left handed - completed PT 07/2014 (17 sessions)  . Depression    mild, on cymbalta per prior PCP  . Erectile dysfunction    per urology (prostaglandin injection)  . GERD (gastroesophageal reflux disease)    with sliding HH  . H/O hiatal hernia   . History of kidney problems 12/2010   lacerated left kidney after fall  . HLD (hyperlipidemia)   . Kidney cysts 02/2014   bilateral (renal US)  . Migraines 03/11/12   now rare  . OSA on CPAP    sleep study 01/2014 - severe, CPAP at 10, previously on nuvigil (Dr. [name])  . Palpitations 01/2012   holter WNL, rare PAC/PVCs  . Presbycusis of both ears 2016   rec amplification [facility])  . Restless leg syndrome    well  controlled on mirapex  . Rosacea    Kowalski  . TIA (transient ischemic attack)   . Type 2 diabetes, controlled, with neuropathy [facility]) 2011   [name] in [facility]    Past Surgical History:  Procedure Laterality Date  . ABI  2007   WNL  . CARDIAC CATHETERIZATION  2004   normal; [place] [facility]  . CARDIOVASCULAR STRESS TEST  02/2014   WNL, low risk scan, EF 68%  . CAROTID ENDARTERECTOMY    . CATARACT EXTRACTION W/ INTRAOCULAR LENS IMPLANT  11//2012 - 08/2011  . COLONOSCOPY  03/2011   poor prep, unable to biopsy polyp in tic fold, rpt 3 mo [name])  . COLONOSCOPY  06/2011   mult polyps adenomatous, severe diverticulosis, rpt 3 yrs [name])  . CYSTOSCOPY WITH URETHRAL DILATATION N/A 07/24/2013   Procedure: CYSTOSCOPY WITH URETHRAL DILATATION;  Surgeon: [name], James Bell;  [facility];  Service: Urology;  Laterality: N/A;  . ESOPHAGOGASTRODUODENOSCOPY  2006   duodenitis, medual HH, Grade 2 reflux, mild gastritis  . ESOPHAGOGASTRODUODENOSCOPY  07/2011   mild chronic gastritis  . [place]  . [place]; 2002   left, then repaired  . MRI brain  04/2010   WNL  . PROSTATE SURGERY  02/2009   PVP (laser)  . SKIN CANCER EXCISION  2002   bridge of nose, BCC  . TONSILLECTOMY AND ADENOIDECTOMY  ~ 1945  . TRANSURETHRAL INCISION OF PROSTATE N/A 07/24/2013   Procedure: TRANSURETHRAL INCISION OF THE PROSTATE (TUIP);  Surgeon: [name], James Bell;  [facility];  Service: Urology;  Laterality: N/A;  . URETHRAL DILATION  2014   tannenbaum    Vitals:   11/12/17 0924  BP: 124/71  Pulse: 73  SpO2: 99%    Subjective Assessment - 11/12/17 1044    Subjective  Pt reports that he is doing well on this date. He has been encouraged by progress he has made in therapy. No specific questions or concerns at this time.     Pertinent History  Patinet had CVA March 11 2012. He had rehab with hospital and SNF and then outpatient therapy. He wears a lumbar corset to  decrease his back pain if he is standing.     How long can you sit comfortably?  unlimited    How long can you stand comfortably?  10 or 15 mins    How long can you walk comfortably?  15 mins using a rollator outdoors.    Patient Stated Goals  Patient wants to be able to stand for longer periods and walk for longer periods.    Currently in Pain?  No/denies           TREATMENT  Ther-ex  Nu-step L2, seat position 11, hand grip position 10 x 5 mins during history (4 minutes unbilled); Step up/down to 6" step alternating leading LE x 10 each with faded UE support on // bars until pt is not using UE at all; Sit to stand from regular height chair without UE support x 5; Sit to stand with Airex under feet and on seat without UE support x 7;  Neuromuscular Re-education  Rocker board balance in R/L and A/P directions first static and then with horizontal/vertical head turns; Toe taps to 6" step alternating LE with faded UE support on // bars until pt is not using UE at all; SAEBO forward reaching activities with LUE with feet together on Airex and gradually increasing distance of reach x multiple bouts; Tandem gait in // bars with faded UE support on // bars until pt is not using UE at all, intermittent minA+1 support from therapist;                     PT Education - 11/12/17 1044    Education provided  Yes    Education Details  multifactoral balance challenges    Person(s) Educated  Patient    Methods  Explanation    Comprehension  Verbalized understanding       PT Short Term Goals - 10/24/17 0858      PT SHORT TERM GOAL #1   Title  Patient will ascend and descend step without railing and use of hemiwalker without loss of balance to be able to safely get into his house.    Baseline  Patient continues to use the railing with steps    Time  6    Period  Weeks    Status  On-going    Target Date  11/14/17        PT Long Term Goals - 10/24/17 0859      PT  LONG TERM GOAL #1   Title  Patient will be able to stand for 1 hour without increase  in pain to demonstrate improved tolerance for ADLs.     Baseline  15 mins    Period  Weeks    Status  On-going    Target Date  12/20/99      PT LONG TERM GOAL #2   Title  Patient will reduce timed up and go to <11 seconds to reduce fall risk and demonstrate improved transfer/gait ability.    Baseline  26 sec, 10/24/17 18.83 sec    Time  12    Period  Weeks    Status  On-going      PT LONG TERM GOAL #3   Title  Patient (> 6 years old) will complete five times sit to stand test in < 15 seconds indicating an increased LE strength and improved balance.    Baseline  5x sit to stand in 25.73 seconds. , 22.63 sec 10/24/17    Time  12    Period  Weeks    Status  On-going    Target Date  12/19/17      PT LONG TERM GOAL #4   Title  Patient will increase six minute walk test distance to >600 for progression to community ambulator and improve gait ability    Baseline  474 ft, 710 feet    Time  12    Period  Weeks    Status  On-going    Target Date  12/19/17            Plan - 11/12/17 1045    Clinical Impression Statement  Pt does very well wit htherapy on this date. He is able to perform tandem gait in parallel bars with progressively faded UE support. He demonstrates most consistent loss of balance in posterior direction on rocker board and on Airex bad. Decreased RLE control for descend during step down. Pt encouraged to follow-up as scheduled. He will continue to benefit from skilled therapy to address his deficits in strength, balance, and safety.     Rehab Potential  Good    PT Frequency  2x / week    PT Treatment/Interventions  Dry needling;Manual techniques;Balance training;Neuromuscular re-education;Patient/family education;Therapeutic exercise;Therapeutic activities;Functional mobility training;Stair training;Gait training;Moist Heat;Cryotherapy;Electrical Stimulation    PT Next Visit Plan   balance and strength training    Consulted and Agree with Plan of Care  Patient       Patient will benefit from skilled therapeutic intervention in order to improve the following deficits and impairments:  Abnormal gait, Decreased balance, Decreased endurance, Decreased mobility, Difficulty walking, Impaired sensation, Impaired tone, Decreased activity tolerance, Decreased strength, Decreased safety awareness, Pain  Visit Diagnosis: Muscle weakness  Unsteadiness     Problem List Patient Active Problem List   Diagnosis Date Noted  . Rosacea   . Gait disturbance, post-stroke 10/05/2015  . Presbycusis of both ears   . Advanced care planning/counseling discussion 06/23/2015  . Impaired mobility 04/23/2015  . HTN (hypertension) 03/18/2015  . Chronic diastolic heart failure, NYHA class 1 ([place]) 03/18/2015  . Spastic hemiparesis affecting dominant side ([place]) 07/07/2014  . Chest pressure 02/03/2014  . Medicare annual wellness visit, subsequent 11/06/2013  . Erectile dysfunction due to arterial insufficiency 02/05/2013  . MDD (major depressive disorder), recurrent episode, moderate ([place])   . Palpitations 06/26/2012  . HLD (hyperlipidemia)   . Restless leg syndrome   . GERD (gastroesophageal reflux disease)   . BPH with obstruction/lower urinary tract symptoms   . History of CVA (cerebrovascular accident) 03/11/2012  . Hemiparesis affecting right side as  late effect of stroke ([place]) 03/11/2012  . Type 2 diabetes, controlled, with neuropathy ([place]) 03/11/2012  . OSA on CPAP 03/11/2012  . CKD (chronic kidney disease) stage 3, GFR 30-59 ml/min (HCC) 03/11/2012  . Migraines 03/11/2012   Phillips Grout PT, DPT   Rhanda Lemire 11/12/2017, 10:51 AM  [place] MAIN [facility] SERVICES [address] [place], [name], [address] Phone: [phone]   Fax:  [phone]  Name: James ANDING, James Bell MRN: [id number] Date of Birth: Feb 06, 1931

## 2017-11-14 ENCOUNTER — Ambulatory Visit: Payer: Medicare Other | Admitting: Physical Therapy

## 2017-11-15 ENCOUNTER — Ambulatory Visit: Payer: Medicare Other | Admitting: Physical Therapy

## 2017-11-15 ENCOUNTER — Encounter: Payer: Self-pay | Admitting: Physical Therapy

## 2017-11-15 DIAGNOSIS — R269 Unspecified abnormalities of gait and mobility: Secondary | ICD-10-CM

## 2017-11-15 DIAGNOSIS — M6281 Muscle weakness (generalized): Secondary | ICD-10-CM | POA: Diagnosis not present

## 2017-11-15 DIAGNOSIS — R2681 Unsteadiness on feet: Secondary | ICD-10-CM

## 2017-11-15 NOTE — Therapy (Signed)
[place] MAIN [facility] SERVICES [address] [place], [name], [address] Phone: [phone]   Fax:  [phone]  Physical Therapy Treatment  Patient Details  Name: James BRANTON, MD MRN: [id number] Date of Birth: 1931/05/26 Referring Provider: Charlett Blake    Encounter Date: 11/15/2017  PT End of Session - 11/15/17 0941    Visit Number  13    Number of Visits  25    Date for PT Re-Evaluation  12/19/17    PT Start Time  0835    PT Stop Time  0915    PT Time Calculation (min)  40 min    Equipment Utilized During Treatment  Gait belt    Activity Tolerance  Patient limited by fatigue;Patient tolerated treatment well    Behavior During Therapy  [facility] for tasks assessed/performed       Past Medical History:  Diagnosis Date  . Anemia   . Basal cell carcinoma 2018   R temple   . Basal cell carcinoma of nose 2002   bridge of nose  . BPH with obstruction/lower urinary tract symptoms    failed flomax, uroxatral, myrbetriq - sees urology [name]) pending videourodynamics  . CKD (chronic kidney disease) stage 3, GFR 30-59 ml/min (HCC) 03/11/2012   Renal US 02/2014 - multiple kidney cysts   . CVA (cerebral infarction) 03/07/12   slurred speech; right hemplegia, left handed - completed PT 07/2014 (17 sessions)  . Depression    mild, on cymbalta per prior PCP  . Erectile dysfunction    per urology (prostaglandin injection)  . GERD (gastroesophageal reflux disease)    with sliding HH  . H/O hiatal hernia   . History of kidney problems 12/2010   lacerated left kidney after fall  . HLD (hyperlipidemia)   . Kidney cysts 02/2014   bilateral (renal US)  . Migraines 03/11/12   now rare  . OSA on CPAP    sleep study 01/2014 - severe, CPAP at 10, previously on nuvigil (Dr. [name])  . Palpitations 01/2012   holter WNL, rare PAC/PVCs  . Presbycusis of both ears 2016   rec amplification [facility])  . Restless leg syndrome    well  controlled on mirapex  . Rosacea    Kowalski  . TIA (transient ischemic attack)   . Type 2 diabetes, controlled, with neuropathy [facility]) 2011   [name] in [facility]    Past Surgical History:  Procedure Laterality Date  . ABI  2007   WNL  . CARDIAC CATHETERIZATION  2004   normal; [place] [facility]  . CARDIOVASCULAR STRESS TEST  02/2014   WNL, low risk scan, EF 68%  . CAROTID ENDARTERECTOMY    . CATARACT EXTRACTION W/ INTRAOCULAR LENS IMPLANT  11//2012 - 08/2011  . COLONOSCOPY  03/2011   poor prep, unable to biopsy polyp in tic fold, rpt 3 mo [name])  . COLONOSCOPY  06/2011   mult polyps adenomatous, severe diverticulosis, rpt 3 yrs [name])  . CYSTOSCOPY WITH URETHRAL DILATATION N/A 07/24/2013   Procedure: CYSTOSCOPY WITH URETHRAL DILATATION;  Surgeon: [name], MD;  Location: [facility];  Service: Urology;  Laterality: N/A;  . ESOPHAGOGASTRODUODENOSCOPY  2006   duodenitis, medual HH, Grade 2 reflux, mild gastritis  . ESOPHAGOGASTRODUODENOSCOPY  07/2011   mild chronic gastritis  . [place]  . [place]; 2002   left, then repaired  . MRI brain  04/2010   WNL  . PROSTATE SURGERY  02/2009   PVP (laser)  . SKIN CANCER EXCISION  2002   bridge of nose, BCC  . TONSILLECTOMY AND ADENOIDECTOMY  ~ 1945  . TRANSURETHRAL INCISION OF PROSTATE N/A 07/24/2013   Procedure: TRANSURETHRAL INCISION OF THE PROSTATE (TUIP);  Surgeon: [name], MD;  Location: [facility];  Service: Urology;  Laterality: N/A;  . URETHRAL DILATION  2014   tannenbaum    There were no vitals filed for this visit.  Subjective Assessment - 11/15/17 0909    Subjective  Pt reports that he is doing well on this date. He has been encouraged by progress he has made in therapy. No specific questions or concerns at this time.   (Pended)     Pertinent History  Patinet had CVA March 11 2012. He had rehab with hospital and SNF and then outpatient therapy. He wears a lumbar corset to decrease his  back pain if he is standing.   (Pended)     How long can you sit comfortably?  unlimited  (Pended)     How long can you stand comfortably?  10 or 15 mins  (Pended)     How long can you walk comfortably?  15 mins using a rollator outdoors.  (Pended)     Patient Stated Goals  Patient wants to be able to stand for longer periods and walk for longer periods.  (Pended)     Pain Onset  Today  (Pended)        Treatment:  Nu-step warm up x 5 mins  Matrix fwd/ bwd 17. 5 lbs x 3 reps, side to side 12. 5 x 1 lap, min assist and loss of balance 25%  Tandem stand with 1/2 foam side by side and head turns  in parallel bars with LUE x 25, CGA and 10% LOB  Standing on  purple foam and toe taps to 6 inch stool, and toe taps to stepping stones  x 20 with LOB towards left side , 50% LOB  ttoe taps from floor to stepping stones  x 20 with LOB towards left side , 50% LOB  Fwd/stepping and bwd stepping over 1/2 foam x 5 UE support 50%  Side stepping over 1/2 foam x 5 in parallel bars with UE support 25-50%  CGA and Min to mod verbal cues used throughout with increased in postural sway and LOB most seen with narrow base of support and while on uneven surfaces. Continues to have balance deficits typical with diagnosis. Patient performs intermediate level exercises without pain behaviors and needs verbal cuing for postural alignment and head positioning Tactile cues and assistance needed to keep lower leg and knee in neutral to avoid compensations with ankle motions                       PT Education - 11/15/17 0940    Education provided  Yes    Education Details  safety with balance and balance strageties    Person(s) Educated  Patient    Methods  Explanation;Demonstration;Tactile cues    Comprehension  Verbalized understanding;Returned demonstration       PT Short Term Goals - 10/24/17 9381      PT SHORT TERM GOAL #1   Title  Patient will ascend and descend step without  railing and use of hemiwalker without loss of balance to be able to safely get into his house.    Baseline  Patient continues to use the railing with steps  Time  6    Period  Weeks    Status  On-going    Target Date  11/14/17        PT Long Term Goals - 10/24/17 0859      PT LONG TERM GOAL #1   Title  Patient will be able to stand for 1 hour without increase in pain to demonstrate improved tolerance for ADLs.     Baseline  15 mins    Period  Weeks    Status  On-going    Target Date  12/20/99      PT LONG TERM GOAL #2   Title  Patient will reduce timed up and go to <11 seconds to reduce fall risk and demonstrate improved transfer/gait ability.    Baseline  26 sec, 10/24/17 18.83 sec    Time  12    Period  Weeks    Status  On-going      PT LONG TERM GOAL #3   Title  Patient (> 11 years old) will complete five times sit to stand test in < 15 seconds indicating an increased LE strength and improved balance.    Baseline  5x sit to stand in 25.73 seconds. , 22.63 sec 10/24/17    Time  12    Period  Weeks    Status  On-going    Target Date  12/19/17      PT LONG TERM GOAL #4   Title  Patient will increase six minute walk test distance to >600 for progression to community ambulator and improve gait ability    Baseline  474 ft, 710 feet    Time  12    Period  Weeks    Status  On-going    Target Date  12/19/17            Plan - 11/15/17 0942    Clinical Impression Statement  Dynamic and static balance interventions continued today, with a focus on unilateral LE stability. Pt responded well to all interventions today.  Focus on core and hip strengthening in order to improve balance and ambulation.    Pt tolerated all exercises well, but required increased     Rehab Potential  Good    PT Frequency  2x / week    PT Treatment/Interventions  Dry needling;Manual techniques;Balance training;Neuromuscular re-education;Patient/family education;Therapeutic exercise;Therapeutic  activities;Functional mobility training;Stair training;Gait training;Moist Heat;Cryotherapy;Electrical Stimulation    PT Next Visit Plan  balance and strength training    Consulted and Agree with Plan of Care  Patient       Patient will benefit from skilled therapeutic intervention in order to improve the following deficits and impairments:  Abnormal gait, Decreased balance, Decreased endurance, Decreased mobility, Difficulty walking, Impaired sensation, Impaired tone, Decreased activity tolerance, Decreased strength, Decreased safety awareness, Pain  Visit Diagnosis: Muscle weakness  Unsteadiness  Abnormality of gait     Problem List Patient Active Problem List   Diagnosis Date Noted  . Rosacea   . Gait disturbance, post-stroke 10/05/2015  . Presbycusis of both ears   . Advanced care planning/counseling discussion 06/23/2015  . Impaired mobility 04/23/2015  . HTN (hypertension) 03/18/2015  . Chronic diastolic heart failure, NYHA class 1 ([place]) 03/18/2015  . Spastic hemiparesis affecting dominant side ([place]) 07/07/2014  . Chest pressure 02/03/2014  . Medicare annual wellness visit, subsequent 11/06/2013  . Erectile dysfunction due to arterial insufficiency 02/05/2013  . MDD (major depressive disorder), recurrent episode, moderate ([place])   . Palpitations 06/26/2012  . HLD (  hyperlipidemia)   . Restless leg syndrome   . GERD (gastroesophageal reflux disease)   . BPH with obstruction/lower urinary tract symptoms   . History of CVA (cerebrovascular accident) 03/11/2012  . Hemiparesis affecting right side as late effect of stroke ([place]) 03/11/2012  . Type 2 diabetes, controlled, with neuropathy ([place]) 03/11/2012  . OSA on CPAP 03/11/2012  . CKD (chronic kidney disease) stage 3, GFR 30-59 ml/min (HCC) 03/11/2012  . Migraines 03/11/2012    Alanson Puls, PT DPT 11/15/2017, 9:45 AM  [place] MAIN [facility] SERVICES [place], [name], [address] Phone: [phone]   Fax:  [phone]  Name: GILMER KAMINSKY, MD MRN: [id number] Date of Birth: 11/30/1930

## 2017-11-18 ENCOUNTER — Other Ambulatory Visit: Payer: Self-pay | Admitting: Family Medicine

## 2017-11-19 ENCOUNTER — Encounter: Payer: Self-pay | Admitting: Physical Therapy

## 2017-11-19 ENCOUNTER — Ambulatory Visit: Payer: Medicare Other | Attending: Physical Medicine & Rehabilitation | Admitting: Physical Therapy

## 2017-11-19 DIAGNOSIS — M6281 Muscle weakness (generalized): Secondary | ICD-10-CM

## 2017-11-19 DIAGNOSIS — R2681 Unsteadiness on feet: Secondary | ICD-10-CM | POA: Diagnosis not present

## 2017-11-19 DIAGNOSIS — R269 Unspecified abnormalities of gait and mobility: Secondary | ICD-10-CM

## 2017-11-19 NOTE — Therapy (Signed)
[place] MAIN [facility] SERVICES [address] [place], [name], [address] Phone: [phone]   Fax:  [phone]  Physical Therapy Treatment  Patient Details  Name: James CARNEAL, James Bell MRN: [id number] Date of Birth: 1931-08-04 Referring Provider: Charlett Blake    Encounter Date: 11/19/2017  PT End of Session - 11/19/17 0848    Visit Number  14    Number of Visits  25    Date for PT Re-Evaluation  12/19/17    Equipment Utilized During Treatment  Gait belt    Activity Tolerance  Patient limited by fatigue;Patient tolerated treatment well    Behavior During Therapy  [facility] for tasks assessed/performed       Past Medical History:  Diagnosis Date  . Anemia   . Basal cell carcinoma 2018   R temple   . Basal cell carcinoma of nose 2002   bridge of nose  . BPH with obstruction/lower urinary tract symptoms    failed flomax, uroxatral, myrbetriq - sees urology [name]) pending videourodynamics  . CKD (chronic kidney disease) stage 3, GFR 30-59 ml/min (HCC) 03/11/2012   Renal US 02/2014 - multiple kidney cysts   . CVA (cerebral infarction) 03/07/12   slurred speech; right hemplegia, left handed - completed PT 07/2014 (17 sessions)  . Depression    mild, on cymbalta per prior PCP  . Erectile dysfunction    per urology (prostaglandin injection)  . GERD (gastroesophageal reflux disease)    with sliding HH  . H/O hiatal hernia   . History of kidney problems 12/2010   lacerated left kidney after fall  . HLD (hyperlipidemia)   . Kidney cysts 02/2014   bilateral (renal US)  . Migraines 03/11/12   now rare  . OSA on CPAP    sleep study 01/2014 - severe, CPAP at 10, previously on nuvigil (Dr. [name])  . Palpitations 01/2012   holter WNL, rare PAC/PVCs  . Presbycusis of both ears 2016   rec amplification [facility])  . Restless leg syndrome    well controlled on mirapex  . Rosacea    [place]ow[place]ki  . TIA (transient ischemic attack)   . Type  2 diabetes, controlled, with neuropathy [facility]) 2011   [name] in [facility]    Past Surgical History:  Procedure Laterality Date  . ABI  2007   WNL  . CARDIAC CATHETERIZATION  2004   normal; [place] [facility]  . CARDIOVASCULAR STRESS TEST  02/2014   WNL, low risk scan, EF 68%  . CAROTID ENDARTERECTOMY    . CATARACT EXTRACTION W/ INTRAOCULAR LENS IMPLANT  11//2012 - 08/2011  . COLONOSCOPY  03/2011   poor prep, unable to biopsy polyp in tic fold, rpt 3 mo [name])  . COLONOSCOPY  06/2011   mult polyps adenomatous, severe diverticulosis, rpt 3 yrs [name])  . CYSTOSCOPY WITH URETHRAL DILATATION N/A 07/24/2013   Procedure: CYSTOSCOPY WITH URETHRAL DILATATION;  Surgeon: [name], James Bell;  [facility];  Service: Urology;  Laterality: N/A;  . ESOPHAGOGASTRODUODENOSCOPY  2006   duodenitis, medual HH, Grade 2 reflux, mild gastritis  . ESOPHAGOGASTRODUODENOSCOPY  07/2011   mild chronic gastritis  . [place]  . [place]; 2002   left, then repaired  . MRI brain  04/2010   WNL  . PROSTATE SURGERY  02/2009   PVP (laser)  . SKIN CANCER EXCISION  2002   bridge of nose, BCC  . TONSILLECTOMY AND ADENOIDECTOMY  ~  [place] N/A 07/24/2013   Procedure: TRANSURETHRAL INCISION OF THE PROSTATE (TUIP);  Surgeon: [name], James Bell;  [facility];  Service: Urology;  Laterality: N/A;  . URETHRAL DILATION  2014   tannenbaum    There were no vitals filed for this visit.  Subjective Assessment - 11/19/17 0847    Subjective  Patient is doing well with his exercises at home.     Pertinent History  Patinet had CVA March 11 2012. He had rehab with hospital and SNF and then outpatient therapy. He wears a lumbar corset to decrease his back pain if he is standing.     How long can you sit comfortably?  unlimited    How long can you stand comfortably?  10 or 15 mins    How long can you walk comfortably?  15 mins using a rollator outdoors.     Patient Stated Goals  Patient wants to be able to stand for longer periods and walk for longer periods.    Currently in Pain?  No/denies    Pain Score  0-No pain    Pain Onset  Today      Treatment:  Side stepping over 1/2 foam x 2 , spaced 3 feet apart left and right wthout UE support to improve weight shift with increased step height and increased step width to improve dyanmic standing balance x 15 repetitions  Fwd and bwd stepping without UE support 1/2 foam spaced 3 feet apart to improve dynamic balance and weight shift acceptance over weaker leg and to promote increased step height and increased step length x 15 repetitions  Standing RLE flat side up, LLE flat side down in side by side position with 6 inch space length and width, adding weight shift fwd / bwd with hips, adding swinging BUE arms recprically to challenge ankle strength, dynamic standing balance  Matrix 17. 5 lbs fwd/ bwd x 3 repetitions with cues to take steps past each foot to challenge dynamic standing balance with min assist   Patient performs all above exercises with CGA and cues for correct technique. His performance improves with practice. He has mod fatigue following therapy.                        PT Education - 11/19/17 0848    Education provided  Yes    Education Details  reviewed HEP     Person(s) Educated  Patient    Methods  Explanation;Demonstration    Comprehension  Verbalized understanding;Returned demonstration       PT Short Term Goals - 10/24/17 0858      PT SHORT TERM GOAL #1   Title  Patient will ascend and descend step without railing and use of hemiwalker without loss of balance to be able to safely get into his house.    Baseline  Patient continues to use the railing with steps    Time  6    Period  Weeks    Status  On-going    Target Date  11/14/17        PT Long Term Goals - 10/24/17 0859      PT LONG TERM GOAL #1   Title  Patient will be able to stand  for 1 hour without increase in pain to demonstrate improved tolerance for ADLs.     Baseline  15 mins    Period  Weeks    Status  On-going  Target Date  12/20/99      PT LONG TERM GOAL #2   Title  Patient will reduce timed up and go to <11 seconds to reduce fall risk and demonstrate improved transfer/gait ability.    Baseline  26 sec, 10/24/17 18.83 sec    Time  12    Period  Weeks    Status  On-going      PT LONG TERM GOAL #3   Title  Patient (> 31 years old) will complete five times sit to stand test in < 15 seconds indicating an increased LE strength and improved balance.    Baseline  5x sit to stand in 25.73 seconds. , 22.63 sec 10/24/17    Time  12    Period  Weeks    Status  On-going    Target Date  12/19/17      PT LONG TERM GOAL #4   Title  Patient will increase six minute walk test distance to >600 for progression to community ambulator and improve gait ability    Baseline  474 ft, 710 feet    Time  12    Period  Weeks    Status  On-going    Target Date  12/19/17            Plan - 11/19/17 0849    Clinical Impression Statement  Pt was able to progress dynamic balance exercises today, noting improved postural reactions with LOB and moving outside normal BOS.  Pt was able to perform all exercises on uneven surfaces with minimal LOB noted.  Dynamic balance stability activities were progressed  today, with decrease control noted when attempting to perform tasks with the R > L.  Pt would continue to benefit from skilled therapy services to address further balance impairments and decrease falls risk.    Rehab Potential  Good    PT Frequency  2x / week    PT Treatment/Interventions  Dry needling;Manual techniques;Balance training;Neuromuscular re-education;Patient/family education;Therapeutic exercise;Therapeutic activities;Functional mobility training;Stair training;Gait training;Moist Heat;Cryotherapy;Electrical Stimulation    PT Next Visit Plan  balance and strength  training    Consulted and Agree with Plan of Care  Patient       Patient will benefit from skilled therapeutic intervention in order to improve the following deficits and impairments:  Abnormal gait, Decreased balance, Decreased endurance, Decreased mobility, Difficulty walking, Impaired sensation, Impaired tone, Decreased activity tolerance, Decreased strength, Decreased safety awareness, Pain  Visit Diagnosis: Muscle weakness  Unsteadiness  Abnormality of gait     Problem List Patient Active Problem List   Diagnosis Date Noted  . Rosacea   . Gait disturbance, post-stroke 10/05/2015  . Presbycusis of both ears   . Advanced care planning/counseling discussion 06/23/2015  . Impaired mobility 04/23/2015  . HTN (hypertension) 03/18/2015  . Chronic diastolic heart failure, NYHA class 1 ([place]) 03/18/2015  . Spastic hemiparesis affecting dominant side ([place]) 07/07/2014  . Chest pressure 02/03/2014  . Medicare annual wellness visit, subsequent 11/06/2013  . Erectile dysfunction due to arterial insufficiency 02/05/2013  . MDD (major depressive disorder), recurrent episode, moderate ([place])   . Palpitations 06/26/2012  . HLD (hyperlipidemia)   . Restless leg syndrome   . GERD (gastroesophageal reflux disease)   . BPH with obstruction/lower urinary tract symptoms   . History of CVA (cerebrovascular accident) 03/11/2012  . Hemiparesis affecting right side as late effect of stroke ([place]) 03/11/2012  . Type 2 diabetes, controlled, with neuropathy ([place]) 03/11/2012  . OSA on CPAP 03/11/2012  .  CKD (chronic kidney disease) stage 3, GFR 30-59 ml/min (HCC) 03/11/2012  . Migraines 03/11/2012    Alanson Puls, PT DPT 11/19/2017, 9:43 AM  [place] MAIN [facility] SERVICES [address] [place], [name], [address] Phone: [phone]   Fax:  [phone]  Name: James ALAMO, James Bell MRN: [id number] Date of Birth: Jan 18, 1931

## 2017-11-20 DIAGNOSIS — Z794 Long term (current) use of insulin: Secondary | ICD-10-CM | POA: Diagnosis not present

## 2017-11-20 DIAGNOSIS — I129 Hypertensive chronic kidney disease with stage 1 through stage 4 chronic kidney disease, or unspecified chronic kidney disease: Secondary | ICD-10-CM | POA: Diagnosis not present

## 2017-11-20 DIAGNOSIS — N183 Chronic kidney disease, stage 3 (moderate): Secondary | ICD-10-CM | POA: Diagnosis not present

## 2017-11-20 DIAGNOSIS — E1142 Type 2 diabetes mellitus with diabetic polyneuropathy: Secondary | ICD-10-CM | POA: Diagnosis not present

## 2017-11-20 DIAGNOSIS — E785 Hyperlipidemia, unspecified: Secondary | ICD-10-CM | POA: Diagnosis not present

## 2017-11-20 DIAGNOSIS — G8191 Hemiplegia, unspecified affecting right dominant side: Secondary | ICD-10-CM | POA: Diagnosis not present

## 2017-11-20 DIAGNOSIS — M791 Myalgia, unspecified site: Secondary | ICD-10-CM | POA: Diagnosis not present

## 2017-11-20 DIAGNOSIS — I69320 Aphasia following cerebral infarction: Secondary | ICD-10-CM | POA: Diagnosis not present

## 2017-11-20 DIAGNOSIS — E039 Hypothyroidism, unspecified: Secondary | ICD-10-CM | POA: Diagnosis not present

## 2017-11-21 ENCOUNTER — Ambulatory Visit: Payer: Medicare Other | Admitting: Physical Therapy

## 2017-11-22 ENCOUNTER — Encounter: Payer: Self-pay | Admitting: Physical Therapy

## 2017-11-22 ENCOUNTER — Ambulatory Visit: Payer: Medicare Other | Admitting: Physical Therapy

## 2017-11-22 DIAGNOSIS — R269 Unspecified abnormalities of gait and mobility: Secondary | ICD-10-CM | POA: Diagnosis not present

## 2017-11-22 DIAGNOSIS — M6281 Muscle weakness (generalized): Secondary | ICD-10-CM

## 2017-11-22 DIAGNOSIS — R2681 Unsteadiness on feet: Secondary | ICD-10-CM

## 2017-11-22 NOTE — Therapy (Signed)
[place] MAIN [facility] SERVICES [address] [place], [name], [address] Phone: [phone]   Fax:  [phone]  Physical Therapy Treatment  Patient Details  Name: James MCPEEK, MD MRN: [id number] Date of Birth: 08/21/1931 Referring Provider: Charlett Blake    Encounter Date: 11/22/2017  PT End of Session - 11/22/17 0916    Visit Number  15    Number of Visits  25    Date for PT Re-Evaluation  12/19/17    PT Start Time  0850    PT Stop Time  0930    PT Time Calculation (min)  40 min    Equipment Utilized During Treatment  Gait belt    Activity Tolerance  Patient limited by fatigue;Patient tolerated treatment well    Behavior During Therapy  [facility] for tasks assessed/performed       Past Medical History:  Diagnosis Date  . Anemia   . Basal cell carcinoma 2018   R temple   . Basal cell carcinoma of nose 2002   bridge of nose  . BPH with obstruction/lower urinary tract symptoms    failed flomax, uroxatral, myrbetriq - sees urology [name]) pending videourodynamics  . CKD (chronic kidney disease) stage 3, GFR 30-59 ml/min (HCC) 03/11/2012   Renal US 02/2014 - multiple kidney cysts   . CVA (cerebral infarction) 03/07/12   slurred speech; right hemplegia, left handed - completed PT 07/2014 (17 sessions)  . Depression    mild, on cymbalta per prior PCP  . Erectile dysfunction    per urology (prostaglandin injection)  . GERD (gastroesophageal reflux disease)    with sliding HH  . H/O hiatal hernia   . History of kidney problems 12/2010   lacerated left kidney after fall  . HLD (hyperlipidemia)   . Kidney cysts 02/2014   bilateral (renal US)  . Migraines 03/11/12   now rare  . OSA on CPAP    sleep study 01/2014 - severe, CPAP at 10, previously on nuvigil (Dr. [name])  . Palpitations 01/2012   holter WNL, rare PAC/PVCs  . Presbycusis of both ears 2016   rec amplification [facility])  . Restless leg syndrome    well  controlled on mirapex  . Rosacea    [place]ki  . TIA (transient ischemic attack)   . Type 2 diabetes, controlled, with neuropathy [facility]) 2011   [name] in [facility]    Past Surgical History:  Procedure Laterality Date  . ABI  2007   WNL  . CARDIAC CATHETERIZATION  2004   normal; [place]  . CARDIOVASCULAR STRESS TEST  02/2014   WNL, low risk scan, EF 68%  . CAROTID ENDARTERECTOMY    . CATARACT EXTRACTION W/ INTRAOCULAR LENS IMPLANT  11//2012 - 08/2011  . COLONOSCOPY  03/2011   poor prep, unable to biopsy polyp in tic fold, rpt 3 mo [name])  . COLONOSCOPY  06/2011   mult polyps adenomatous, severe diverticulosis, rpt 3 yrs [name])  . CYSTOSCOPY WITH URETHRAL DILATATION N/A 07/24/2013   Procedure: CYSTOSCOPY WITH URETHRAL DILATATION;  Surgeon: [name], MD;  Location: [facility];  Service: Urology;  Laterality: N/A;  . ESOPHAGOGASTRODUODENOSCOPY  2006   duodenitis, medual HH, Grade 2 reflux, mild gastritis  . ESOPHAGOGASTRODUODENOSCOPY  07/2011   mild chronic gastritis  . [place]  . [place]; 2002   left, then repaired  . MRI brain  04/2010   WNL  . PROSTATE SURGERY  02/2009   PVP (laser)  . SKIN CANCER EXCISION  2002   bridge of nose, BCC  . TONSILLECTOMY AND ADENOIDECTOMY  ~ 1945  . TRANSURETHRAL INCISION OF PROSTATE N/A 07/24/2013   Procedure: TRANSURETHRAL INCISION OF THE PROSTATE (TUIP);  Surgeon: [name], MD;  Location: [facility];  Service: Urology;  Laterality: N/A;  . URETHRAL DILATION  2014   tannenbaum    There were no vitals filed for this visit.  Subjective Assessment - 11/22/17 0916    Subjective  Patient is doing well with his exercises at home.     Pertinent History  Patinet had CVA March 11 2012. He had rehab with hospital and SNF and then outpatient therapy. He wears a lumbar corset to decrease his back pain if he is standing.     How long can you sit comfortably?  unlimited    How long can you stand  comfortably?  10 or 15 mins    How long can you walk comfortably?  15 mins using a rollator outdoors.    Patient Stated Goals  Patient wants to be able to stand for longer periods and walk for longer periods.    Currently in Pain?  No/denies    Pain Score  0-No pain    Pain Onset  Today       Treatment: Outcome measures were completed : TUG, 5 x sit to stand, 10 MW, 6 MW  Goals were reviewed with progress made in all goals  Neuromuscular training: rocker board with taps fwd/ bwd , changing fwd foot and bwd foot and then side to side tapping x 20 each way  Standing on  2  1/2 foam tandem stand with space between feet and weight shifting fwd/ bwd                       PT Education - 11/22/17 0916    Education provided  Yes    Education Details  revewed goals    Person(s) Educated  Patient    Methods  Explanation    Comprehension  Verbalized understanding       PT Short Term Goals - 10/24/17 0858      PT SHORT TERM GOAL #1   Title  Patient will ascend and descend step without railing and use of hemiwalker without loss of balance to be able to safely get into his house.    Baseline  Patient continues to use the railing with steps    Time  6    Period  Weeks    Status  On-going    Target Date  11/14/17        PT Long Term Goals - 11/22/17 7616      PT LONG TERM GOAL #1   Title  Patient will be able to stand for 1 hour without increase in pain to demonstrate improved tolerance for ADLs.     Baseline  15 mins:     Time  12    Period  Weeks    Status  On-going    Target Date  12/19/17      PT LONG TERM GOAL #2   Title  Patient will reduce timed up and go to <11 seconds to reduce fall risk and demonstrate improved transfer/gait ability.    Baseline  26 sec, 10/24/17 18.83 sec;  11/22/17 14.86 sec    Time  12    Period  Weeks    Status  On-going  Target Date  12/19/17      PT LONG TERM GOAL #3   Title  Patient (> 78 years old) will complete five  times sit to stand test in < 15 seconds indicating an increased LE strength and improved balance.    Baseline  5x sit to stand in 25.73 seconds. , 22.63 sec 10/24/17;; 11/22/17 15.96    Time  12    Period  Weeks    Status  On-going    Target Date  12/19/17      PT LONG TERM GOAL #4   Title  Patient will increase six minute walk test distance to >600 for progression to community ambulator and improve gait ability    Baseline  474 ft, 710 feet;,790 feet    Time  12    Period  Weeks    Status  On-going    Target Date  12/18/17            Plan - 11/22/17 2025    Clinical Impression Statement  Patient demonstrates quad weakness, and impaired balance that is causing difficulty with walking and mobility activities.  Patient still fatigues quickly to due very weak LE's.  Patient showed poor LE strengthening technique and is unable to generate good form after heavy cueing. Patient will continue to benefit from skilled physical therapy to improve LE strength and dynamic balance to decrease risk of falls    Rehab Potential  Good    PT Frequency  2x / week    PT Treatment/Interventions  Dry needling;Manual techniques;Balance training;Neuromuscular re-education;Patient/family education;Therapeutic exercise;Therapeutic activities;Functional mobility training;Stair training;Gait training;Moist Heat;Cryotherapy;Electrical Stimulation    PT Next Visit Plan  balance and strength training    Consulted and Agree with Plan of Care  Patient       Patient will benefit from skilled therapeutic intervention in order to improve the following deficits and impairments:  Abnormal gait, Decreased balance, Decreased endurance, Decreased mobility, Difficulty walking, Impaired sensation, Impaired tone, Decreased activity tolerance, Decreased strength, Decreased safety awareness, Pain  Visit Diagnosis: Muscle weakness  Unsteadiness  Abnormality of gait     Problem List Patient Active Problem List   Diagnosis  Date Noted  . Rosacea   . Gait disturbance, post-stroke 10/05/2015  . Presbycusis of both ears   . Advanced care planning/counseling discussion 06/23/2015  . Impaired mobility 04/23/2015  . HTN (hypertension) 03/18/2015  . Chronic diastolic heart failure, NYHA class 1 ([place]) 03/18/2015  . Spastic hemiparesis affecting dominant side ([place]) 07/07/2014  . Chest pressure 02/03/2014  . Medicare annual wellness visit, subsequent 11/06/2013  . Erectile dysfunction due to arterial insufficiency 02/05/2013  . MDD (major depressive disorder), recurrent episode, moderate ([place])   . Palpitations 06/26/2012  . HLD (hyperlipidemia)   . Restless leg syndrome   . GERD (gastroesophageal reflux disease)   . BPH with obstruction/lower urinary tract symptoms   . History of CVA (cerebrovascular accident) 03/11/2012  . Hemiparesis affecting right side as late effect of stroke ([place]) 03/11/2012  . Type 2 diabetes, controlled, with neuropathy ([place]) 03/11/2012  . OSA on CPAP 03/11/2012  . CKD (chronic kidney disease) stage 3, GFR 30-59 ml/min (HCC) 03/11/2012  . Migraines 03/11/2012    Alanson Puls, PT DPT 11/22/2017, 9:56 AM  [place] MAIN [facility] SERVICES [address] [place], [name], [address] Phone: [phone]   Fax:  [phone]  Name: ANTONIN MEININGER, MD MRN: [id number] Date of Birth: Jan 18, 1931

## 2017-11-26 ENCOUNTER — Ambulatory Visit: Payer: Medicare Other | Admitting: Physical Therapy

## 2017-11-26 ENCOUNTER — Encounter: Payer: Self-pay | Admitting: Physical Therapy

## 2017-11-26 DIAGNOSIS — M6281 Muscle weakness (generalized): Secondary | ICD-10-CM | POA: Diagnosis not present

## 2017-11-26 DIAGNOSIS — R269 Unspecified abnormalities of gait and mobility: Secondary | ICD-10-CM | POA: Diagnosis not present

## 2017-11-26 DIAGNOSIS — R2681 Unsteadiness on feet: Secondary | ICD-10-CM

## 2017-11-26 NOTE — Therapy (Signed)
[place] MAIN [facility] SERVICES [address] [place], [name], [address] Phone: [phone]   Fax:  [phone]  Physical Therapy Treatment  Patient Details  Name: James SWINGLE, MD MRN: [id number] Date of Birth: 1930/11/24 Referring Provider: Charlett Blake    Encounter Date: 11/26/2017  PT End of Session - 11/26/17 1133    Visit Number  16    Number of Visits  25    Date for PT Re-Evaluation  12/19/17    PT Start Time  0935    PT Stop Time  1015    PT Time Calculation (min)  40 min    Equipment Utilized During Treatment  Gait belt    Activity Tolerance  Patient limited by fatigue;Patient tolerated treatment well    Behavior During Therapy  [facility] for tasks assessed/performed       Past Medical History:  Diagnosis Date  . Anemia   . Basal cell carcinoma 2018   R temple   . Basal cell carcinoma of nose 2002   bridge of nose  . BPH with obstruction/lower urinary tract symptoms    failed flomax, uroxatral, myrbetriq - sees urology [name]) pending videourodynamics  . CKD (chronic kidney disease) stage 3, GFR 30-59 ml/min (HCC) 03/11/2012   Renal US 02/2014 - multiple kidney cysts   . CVA (cerebral infarction) 03/07/12   slurred speech; right hemplegia, left handed - completed PT 07/2014 (17 sessions)  . Depression    mild, on cymbalta per prior PCP  . Erectile dysfunction    per urology (prostaglandin injection)  . GERD (gastroesophageal reflux disease)    with sliding HH  . H/O hiatal hernia   . History of kidney problems 12/2010   lacerated left kidney after fall  . HLD (hyperlipidemia)   . Kidney cysts 02/2014   bilateral (renal US)  . Migraines 03/11/12   now rare  . OSA on CPAP    sleep study 01/2014 - severe, CPAP at 10, previously on nuvigil (Dr. [name])  . Palpitations 01/2012   holter WNL, rare PAC/PVCs  . Presbycusis of both ears 2016   rec amplification [facility])  . Restless leg syndrome    well  controlled on mirapex  . Rosacea    [place]  . TIA (transient ischemic attack)   . Type 2 diabetes, controlled, with neuropathy [facility]) 2011   [name] in [facility]    Past Surgical History:  Procedure Laterality Date  . ABI  2007   WNL  . CARDIAC CATHETERIZATION  2004   normal; [place]  . CARDIOVASCULAR STRESS TEST  02/2014   WNL, low risk scan, EF 68%  . CAROTID ENDARTERECTOMY    . CATARACT EXTRACTION W/ INTRAOCULAR LENS IMPLANT  11//2012 - 08/2011  . COLONOSCOPY  03/2011   poor prep, unable to biopsy polyp in tic fold, rpt 3 mo [name])  . COLONOSCOPY  06/2011   mult polyps adenomatous, severe diverticulosis, rpt 3 yrs [name])  . CYSTOSCOPY WITH URETHRAL DILATATION N/A 07/24/2013   Procedure: CYSTOSCOPY WITH URETHRAL DILATATION;  Surgeon: [name], MD;  [facility];  Service: Urology;  Laterality: N/A;  . ESOPHAGOGASTRODUODENOSCOPY  2006   duodenitis, medual HH, Grade 2 reflux, mild gastritis  . ESOPHAGOGASTRODUODENOSCOPY  07/2011   mild chronic gastritis  . [place]  . [place]; 2002   left, then repaired  . MRI brain  04/2010   WNL  . PROSTATE SURGERY  02/2009   PVP (laser)  . SKIN CANCER EXCISION  2002   bridge of nose, BCC  . TONSILLECTOMY AND ADENOIDECTOMY  ~ 1945  . TRANSURETHRAL INCISION OF PROSTATE N/A 07/24/2013   Procedure: TRANSURETHRAL INCISION OF THE PROSTATE (TUIP);  Surgeon: [name], MD;  [facility];  Service: Urology;  Laterality: N/A;  . URETHRAL DILATION  2014   tannenbaum    There were no vitals filed for this visit.  Subjective Assessment - 11/26/17 1133    Subjective  Patient is doing well with his exercises at home.     Pertinent History  Patinet had CVA March 11 2012. He had rehab with hospital and SNF and then outpatient therapy. He wears a lumbar corset to decrease his back pain if he is standing.     How long can you sit comfortably?  unlimited    How long can you stand  comfortably?  10 or 15 mins    How long can you walk comfortably?  15 mins using a rollator outdoors.    Patient Stated Goals  Patient wants to be able to stand for longer periods and walk for longer periods.    Currently in Pain?  No/denies    Pain Score  0-No pain       NMR:  Neuromuscular training: rocker board with taps fwd/ bwd , changing fwd foot and bwd foot and then side to side tapping x 20 each way  Standing on  2  1/2 foam tandem stand with space between feet and weight shifting fwd/ bwd  Standing on 1/2 bolster (Flat side up):  Heel/toe rock x15 with UE hold for balance with cues to keep knee straight for better ankle strategies;  Feet in neutral, BUE wand flexion x15 reps with min A for safety and cues to improve weight shift for better stance control; patient was able to utilize ankle strategies well with less loss of balance;  Modified tandem stance on foam with 10 sec hold unsupported x2 reps each foot in front with min A for safety and cues to improve upper weight shift and trunk control for better balance in narrow base of support;                          PT Education - 11/26/17 1133    Education provided  Yes    Education Details  HEP    Person(s) Educated  Patient    Methods  Explanation;Demonstration;Tactile cues;Verbal cues    Comprehension  Verbalized understanding;Returned demonstration       PT Short Term Goals - 10/24/17 0938      PT SHORT TERM GOAL #1   Title  Patient will ascend and descend step without railing and use of hemiwalker without loss of balance to be able to safely get into his house.    Baseline  Patient continues to use the railing with steps    Time  6    Period  Weeks    Status  On-going    Target Date  11/14/17        PT Long Term Goals - 11/22/17 1829      PT LONG TERM GOAL #1   Title  Patient will be able to stand for 1 hour without increase in pain to demonstrate improved tolerance for ADLs.      Baseline  15 mins:     Time  12    Period  Weeks  Status  On-going    Target Date  12/19/17      PT LONG TERM GOAL #2   Title  Patient will reduce timed up and go to <11 seconds to reduce fall risk and demonstrate improved transfer/gait ability.    Baseline  26 sec, 10/24/17 18.83 sec;  11/22/17 14.86 sec    Time  12    Period  Weeks    Status  On-going    Target Date  12/19/17      PT LONG TERM GOAL #3   Title  Patient (> 61 years old) will complete five times sit to stand test in < 15 seconds indicating an increased LE strength and improved balance.    Baseline  5x sit to stand in 25.73 seconds. , 22.63 sec 10/24/17;; 11/22/17 15.96    Time  12    Period  Weeks    Status  On-going    Target Date  12/19/17      PT LONG TERM GOAL #4   Title  Patient will increase six minute walk test distance to >600 for progression to community ambulator and improve gait ability    Baseline  474 ft, 710 feet;,790 feet    Time  12    Period  Weeks    Status  On-going    Target Date  12/18/17            Plan - 11/26/17 1134    Clinical Impression Statement  Continuous verbal cues and tactile cues needed to correct form with exercises and for posture correction. Patient required min verbal cues to perform tilt board for posture and control and required verbal and tactile cues during all dynamic standing balance activities.  Patient is able to perform strengthening exercises without reports of increased pain. Patient does need supervision during therapeutic exercises for correct from and technique. Patient will benefit from further skilled therapy to return to prior level of function.    Rehab Potential  Good    PT Frequency  2x / week    PT Treatment/Interventions  Dry needling;Manual techniques;Balance training;Neuromuscular re-education;Patient/family education;Therapeutic exercise;Therapeutic activities;Functional mobility training;Stair training;Gait training;Moist Heat;Cryotherapy;Electrical  Stimulation    PT Next Visit Plan  balance and strength training    Consulted and Agree with Plan of Care  Patient       Patient will benefit from skilled therapeutic intervention in order to improve the following deficits and impairments:  Abnormal gait, Decreased balance, Decreased endurance, Decreased mobility, Difficulty walking, Impaired sensation, Impaired tone, Decreased activity tolerance, Decreased strength, Decreased safety awareness, Pain  Visit Diagnosis: Muscle weakness  Unsteadiness  Abnormality of gait     Problem List Patient Active Problem List   Diagnosis Date Noted  . Rosacea   . Gait disturbance, post-stroke 10/05/2015  . Presbycusis of both ears   . Advanced care planning/counseling discussion 06/23/2015  . Impaired mobility 04/23/2015  . HTN (hypertension) 03/18/2015  . Chronic diastolic heart failure, NYHA class 1 ([place]) 03/18/2015  . Spastic hemiparesis affecting dominant side ([place]) 07/07/2014  . Chest pressure 02/03/2014  . Medicare annual wellness visit, subsequent 11/06/2013  . Erectile dysfunction due to arterial insufficiency 02/05/2013  . MDD (major depressive disorder), recurrent episode, moderate ([place])   . Palpitations 06/26/2012  . HLD (hyperlipidemia)   . Restless leg syndrome   . GERD (gastroesophageal reflux disease)   . BPH with obstruction/lower urinary tract symptoms   . History of CVA (cerebrovascular accident) 03/11/2012  . Hemiparesis affecting right side as  late effect of stroke ([place]) 03/11/2012  . Type 2 diabetes, controlled, with neuropathy ([place]) 03/11/2012  . OSA on CPAP 03/11/2012  . CKD (chronic kidney disease) stage 3, GFR 30-59 ml/min (HCC) 03/11/2012  . Migraines 03/11/2012    Alanson Puls, PT DPT 11/26/2017, 11:40 AM  [place] MAIN [facility] SERVICES [address] [place], [name], [address] Phone: [phone]   Fax:  [phone]  Name: James DAMON, MD MRN:  [id number] Date of Birth: 03-18-1931

## 2017-11-28 ENCOUNTER — Ambulatory Visit: Payer: Medicare Other | Admitting: Physical Therapy

## 2017-11-29 ENCOUNTER — Ambulatory Visit: Payer: Medicare Other | Admitting: Physical Therapy

## 2017-12-03 ENCOUNTER — Encounter: Payer: Self-pay | Admitting: Physical Therapy

## 2017-12-03 ENCOUNTER — Ambulatory Visit: Payer: Medicare Other | Admitting: Physical Therapy

## 2017-12-03 DIAGNOSIS — R2681 Unsteadiness on feet: Secondary | ICD-10-CM | POA: Diagnosis not present

## 2017-12-03 DIAGNOSIS — R269 Unspecified abnormalities of gait and mobility: Secondary | ICD-10-CM | POA: Diagnosis not present

## 2017-12-03 DIAGNOSIS — M6281 Muscle weakness (generalized): Secondary | ICD-10-CM | POA: Diagnosis not present

## 2017-12-03 NOTE — Therapy (Signed)
[place] MAIN [facility] SERVICES [address] [place], [name], [address] Phone: [phone]   Fax:  [phone]  Physical Therapy Treatment  Patient Details  Name: GODFREY TRITSCHLER, MD MRN: [id number] Date of Birth: 01-20-1931 Referring Provider: Charlett Blake    Encounter Date: 12/03/2017  PT End of Session - 12/03/17 1102    Visit Number  17    Number of Visits  25    Date for PT Re-Evaluation  12/19/17    PT Start Time  1000    PT Stop Time  1025    PT Time Calculation (min)  25 min    Equipment Utilized During Treatment  Gait belt    Activity Tolerance  Patient limited by fatigue;Patient tolerated treatment well    Behavior During Therapy  [facility] for tasks assessed/performed       Past Medical History:  Diagnosis Date  . Anemia   . Basal cell carcinoma 2018   R temple   . Basal cell carcinoma of nose 2002   bridge of nose  . BPH with obstruction/lower urinary tract symptoms    failed flomax, uroxatral, myrbetriq - sees urology [name]) pending videourodynamics  . CKD (chronic kidney disease) stage 3, GFR 30-59 ml/min (HCC) 03/11/2012   Renal US 02/2014 - multiple kidney cysts   . CVA (cerebral infarction) 03/07/12   slurred speech; right hemplegia, left handed - completed PT 07/2014 (17 sessions)  . Depression    mild, on cymbalta per prior PCP  . Erectile dysfunction    per urology (prostaglandin injection)  . GERD (gastroesophageal reflux disease)    with sliding HH  . H/O hiatal hernia   . History of kidney problems 12/2010   lacerated left kidney after fall  . HLD (hyperlipidemia)   . Kidney cysts 02/2014   bilateral (renal US)  . Migraines 03/11/12   now rare  . OSA on CPAP    sleep study 01/2014 - severe, CPAP at 10, previously on nuvigil (Dr. [name])  . Palpitations 01/2012   holter WNL, rare PAC/PVCs  . Presbycusis of both ears 2016   rec amplification [facility])  . Restless leg syndrome    well  controlled on mirapex  . Rosacea    Kowalski  . TIA (transient ischemic attack)   . Type 2 diabetes, controlled, with neuropathy [facility]) 2011   [name] in [facility]    Past Surgical History:  Procedure Laterality Date  . ABI  2007   WNL  . CARDIAC CATHETERIZATION  2004   normal; [place]  . CARDIOVASCULAR STRESS TEST  02/2014   WNL, low risk scan, EF 68%  . CAROTID ENDARTERECTOMY    . CATARACT EXTRACTION W/ INTRAOCULAR LENS IMPLANT  11//2012 - 08/2011  . COLONOSCOPY  03/2011   poor prep, unable to biopsy polyp in tic fold, rpt 3 mo [name])  . COLONOSCOPY  06/2011   mult polyps adenomatous, severe diverticulosis, rpt 3 yrs [name])  . CYSTOSCOPY WITH URETHRAL DILATATION N/A 07/24/2013   Procedure: CYSTOSCOPY WITH URETHRAL DILATATION;  Surgeon: [name], MD;  Location: [facility];  Service: Urology;  Laterality: N/A;  . ESOPHAGOGASTRODUODENOSCOPY  2006   duodenitis, medual HH, Grade 2 reflux, mild gastritis  . ESOPHAGOGASTRODUODENOSCOPY  07/2011   mild chronic gastritis  . [place]  . [place]; 2002   left, then repaired  . MRI brain  04/2010   WNL  . PROSTATE SURGERY  02/2009   PVP (laser)  . SKIN CANCER EXCISION  2002   bridge of nose, BCC  . TONSILLECTOMY AND ADENOIDECTOMY  ~ 1945  . TRANSURETHRAL INCISION OF PROSTATE N/A 07/24/2013   Procedure: TRANSURETHRAL INCISION OF THE PROSTATE (TUIP);  Surgeon: [name], MD;  Location: [facility];  Service: Urology;  Laterality: N/A;  . URETHRAL DILATION  2014   tannenbaum    There were no vitals filed for this visit.  Subjective Assessment - 12/03/17 1058    Subjective  Patient had a change in BP medicine and he did not respond well to it. He has stopped the medicine but still does not feel exactly like himself.     Pertinent History  Patinet had CVA March 11 2012. He had rehab with hospital and SNF and then outpatient therapy. He wears a lumbar corset to decrease his back pain if he is  standing.     How long can you sit comfortably?  unlimited    How long can you stand comfortably?  10 or 15 mins    How long can you walk comfortably?  15 mins using a rollator outdoors.    Patient Stated Goals  Patient wants to be able to stand for longer periods and walk for longer periods.    Currently in Pain?  No/denies    Multiple Pain Sites  No               Treatment:  Balance master for static and dynamic standing balance challenges with foot plate changes and surrounding changes with rest periods for improving balance and stability.   Patient performs limits of stability x 8 min s and assessment program for eyes open and eyes closed with foot plate changes.   CGA and Min to mod verbal cues used throughout with increased in postural sway and LOB most seen with narrow base of support and while on uneven surfaces. Continues to have balance deficits typical with diagnosis.              PT Education - 12/03/17 1102    Education provided  Yes    Education Details  Radiation protection practitioner) Educated  Patient    Methods  Explanation    Comprehension  Verbalized understanding       PT Short Term Goals - 10/24/17 0858      PT SHORT TERM GOAL #1   Title  Patient will ascend and descend step without railing and use of hemiwalker without loss of balance to be able to safely get into his house.    Baseline  Patient continues to use the railing with steps    Time  6    Period  Weeks    Status  On-going    Target Date  11/14/17        PT Long Term Goals - 11/22/17 0109      PT LONG TERM GOAL #1   Title  Patient will be able to stand for 1 hour without increase in pain to demonstrate improved tolerance for ADLs.     Baseline  15 mins:     Time  12    Period  Weeks    Status  On-going    Target Date  12/19/17      PT LONG TERM GOAL #2   Title  Patient will reduce timed up and go to <11 seconds to reduce fall risk and demonstrate improved transfer/gait  ability.  Baseline  26 sec, 10/24/17 18.83 sec;  11/22/17 14.86 sec    Time  12    Period  Weeks    Status  On-going    Target Date  12/19/17      PT LONG TERM GOAL #3   Title  Patient (> 25 years old) will complete five times sit to stand test in < 15 seconds indicating an increased LE strength and improved balance.    Baseline  5x sit to stand in 25.73 seconds. , 22.63 sec 10/24/17;; 11/22/17 15.96    Time  12    Period  Weeks    Status  On-going    Target Date  12/19/17      PT LONG TERM GOAL #4   Title  Patient will increase six minute walk test distance to >600 for progression to community ambulator and improve gait ability    Baseline  474 ft, 710 feet;,790 feet    Time  12    Period  Weeks    Status  On-going    Target Date  12/18/17            Plan - 12/03/17 1119    Clinical Impression Statement  Patient performed dynamic standing balance training on balance machine with changes in foot plate and changes in surrounding  with  difficulty keeping his balance and recovering from weight shift changes. He will conitnue to benefit from skilled PT to improve balance and gait and functional mobility.     Rehab Potential  Good    PT Frequency  2x / week    PT Treatment/Interventions  Dry needling;Manual techniques;Balance training;Neuromuscular re-education;Patient/family education;Therapeutic exercise;Therapeutic activities;Functional mobility training;Stair training;Gait training;Moist Heat;Cryotherapy;Electrical Stimulation    PT Next Visit Plan  balance and strength training    Consulted and Agree with Plan of Care  Patient       Patient will benefit from skilled therapeutic intervention in order to improve the following deficits and impairments:  Abnormal gait, Decreased balance, Decreased endurance, Decreased mobility, Difficulty walking, Impaired sensation, Impaired tone, Decreased activity tolerance, Decreased strength, Decreased safety awareness, Pain  Visit  Diagnosis: Muscle weakness  Unsteadiness  Abnormality of gait     Problem List Patient Active Problem List   Diagnosis Date Noted  . Rosacea   . Gait disturbance, post-stroke 10/05/2015  . Presbycusis of both ears   . Advanced care planning/counseling discussion 06/23/2015  . Impaired mobility 04/23/2015  . HTN (hypertension) 03/18/2015  . Chronic diastolic heart failure, NYHA class 1 ([place]) 03/18/2015  . Spastic hemiparesis affecting dominant side ([place]) 07/07/2014  . Chest pressure 02/03/2014  . Medicare annual wellness visit, subsequent 11/06/2013  . Erectile dysfunction due to arterial insufficiency 02/05/2013  . MDD (major depressive disorder), recurrent episode, moderate ([place])   . Palpitations 06/26/2012  . HLD (hyperlipidemia)   . Restless leg syndrome   . GERD (gastroesophageal reflux disease)   . BPH with obstruction/lower urinary tract symptoms   . History of CVA (cerebrovascular accident) 03/11/2012  . Hemiparesis affecting right side as late effect of stroke ([place]) 03/11/2012  . Type 2 diabetes, controlled, with neuropathy ([place]) 03/11/2012  . OSA on CPAP 03/11/2012  . CKD (chronic kidney disease) stage 3, GFR 30-59 ml/min (HCC) 03/11/2012  . Migraines 03/11/2012    Alanson Puls, PT DPT 12/03/2017, 11:33 AM  [place] MAIN [facility] SERVICES [address] [place], [name], [address] Phone: [phone]   Fax:  [phone]  Name: [name]  Issa, MD MRN: [id number] Date of Birth: 12-Nov-1930

## 2017-12-05 ENCOUNTER — Ambulatory Visit: Payer: Medicare Other | Admitting: Physical Therapy

## 2017-12-06 ENCOUNTER — Ambulatory Visit: Payer: Medicare Other | Admitting: Physical Therapy

## 2017-12-06 ENCOUNTER — Telehealth: Payer: Self-pay | Admitting: Family Medicine

## 2017-12-06 ENCOUNTER — Encounter: Payer: Self-pay | Admitting: Physical Therapy

## 2017-12-06 DIAGNOSIS — R2681 Unsteadiness on feet: Secondary | ICD-10-CM | POA: Diagnosis not present

## 2017-12-06 DIAGNOSIS — R269 Unspecified abnormalities of gait and mobility: Secondary | ICD-10-CM

## 2017-12-06 DIAGNOSIS — M6281 Muscle weakness (generalized): Secondary | ICD-10-CM

## 2017-12-06 NOTE — Telephone Encounter (Signed)
Copied from [place] [id number]. Topic: General - Other >> Dec 06, 2017  4:10 PM Darl Householder, RMA wrote: Reason for CRM: Patient is requesting a call back from Dr. Darnell Level or CMA concerning medication recall for Losartan

## 2017-12-06 NOTE — Therapy (Signed)
[place] MAIN [facility] SERVICES [address] [place], [name], [address] Phone: [phone]   Fax:  [phone]  Physical Therapy Treatment  Patient Details  Name: James JOHANNES, James Bell MRN: [id number] Date of Birth: December 13, 1930 Referring Provider: Charlett Blake    Encounter Date: 12/06/2017  PT End of Session - 12/06/17 1110    Visit Number  18    Number of Visits  25    Date for PT Re-Evaluation  12/19/17    Equipment Utilized During Treatment  Gait belt    Activity Tolerance  Patient limited by fatigue;Patient tolerated treatment well    Behavior During Therapy  [facility] for tasks assessed/performed       Past Medical History:  Diagnosis Date  . Anemia   . Basal cell carcinoma 2018   R temple   . Basal cell carcinoma of nose 2002   bridge of nose  . BPH with obstruction/lower urinary tract symptoms    failed flomax, uroxatral, myrbetriq - sees urology [name]) pending videourodynamics  . CKD (chronic kidney disease) stage 3, GFR 30-59 ml/min (HCC) 03/11/2012   Renal US 02/2014 - multiple kidney cysts   . CVA (cerebral infarction) 03/07/12   slurred speech; right hemplegia, left handed - completed PT 07/2014 (17 sessions)  . Depression    mild, on cymbalta per prior PCP  . Erectile dysfunction    per urology (prostaglandin injection)  . GERD (gastroesophageal reflux disease)    with sliding HH  . H/O hiatal hernia   . History of kidney problems 12/2010   lacerated left kidney after fall  . HLD (hyperlipidemia)   . Kidney cysts 02/2014   bilateral (renal US)  . Migraines 03/11/12   now rare  . OSA on CPAP    sleep study 01/2014 - severe, CPAP at 10, previously on nuvigil (Dr. [name])  . Palpitations 01/2012   holter WNL, rare PAC/PVCs  . Presbycusis of both ears 2016   rec amplification [facility])  . Restless leg syndrome    well controlled on mirapex  . Rosacea    Kowalski  . TIA (transient ischemic attack)   .  Type 2 diabetes, controlled, with neuropathy [facility]) 2011   [name] in [facility]    Past Surgical History:  Procedure Laterality Date  . ABI  2007   WNL  . CARDIAC CATHETERIZATION  2004   normal; [place] [facility]  . CARDIOVASCULAR STRESS TEST  02/2014   WNL, low risk scan, EF 68%  . CAROTID ENDARTERECTOMY    . CATARACT EXTRACTION W/ INTRAOCULAR LENS IMPLANT  11//2012 - 08/2011  . COLONOSCOPY  03/2011   poor prep, unable to biopsy polyp in tic fold, rpt 3 mo [name])  . COLONOSCOPY  06/2011   mult polyps adenomatous, severe diverticulosis, rpt 3 yrs [name])  . CYSTOSCOPY WITH URETHRAL DILATATION N/A 07/24/2013   Procedure: CYSTOSCOPY WITH URETHRAL DILATATION;  Surgeon: [name], James Bell;  [facility];  Service: Urology;  Laterality: N/A;  . ESOPHAGOGASTRODUODENOSCOPY  2006   duodenitis, medual HH, Grade 2 reflux, mild gastritis  . ESOPHAGOGASTRODUODENOSCOPY  07/2011   mild chronic gastritis  . [place]  . [place]; 2002   left, then repaired  . MRI brain  04/2010   WNL  . PROSTATE SURGERY  02/2009   PVP (laser)  . SKIN CANCER EXCISION  2002   bridge of nose, BCC  . TONSILLECTOMY AND ADENOIDECTOMY  ~  [place] N/A 07/24/2013   Procedure: TRANSURETHRAL INCISION OF THE PROSTATE (TUIP);  Surgeon: [name], James Bell;  Location[facility];  Service: Urology;  Laterality: N/A;  . URETHRAL DILATION  2014   tannenbaum    There were no vitals filed for this visit.  Subjective Assessment - 12/06/17 1109    Subjective  Patient is doing better today.     Pertinent History  Patinet had CVA March 11 2012. He had rehab with hospital and SNF and then outpatient therapy. He wears a lumbar corset to decrease his back pain if he is standing.     How long can you sit comfortably?  unlimited    How long can you stand comfortably?  10 or 15 mins    How long can you walk comfortably?  15 mins using a rollator outdoors.    Patient  Stated Goals  Patient wants to be able to stand for longer periods and walk for longer periods.    Currently in Pain?  No/denies    Pain Score  0-No pain    Pain Onset  Today       Treatment; Balance master training including limits of stability and and weight shifting for 6 trials of each for 2 mins . He has difficulty with weight shifting fwd and weight shifting to the right side laterally.  Patient needs CGA for stepping up onto machine and stepping off the machine.  Patient needs CGA during standing balance training session.                        PT Education - 12/06/17 1109    Education provided  Yes    Education Details  balance and safety    Person(s) Educated  Patient    Methods  Explanation    Comprehension  Verbalized understanding;Returned demonstration       PT Short Term Goals - 10/24/17 0858      PT SHORT TERM GOAL #1   Title  Patient will ascend and descend step without railing and use of hemiwalker without loss of balance to be able to safely get into his house.    Baseline  Patient continues to use the railing with steps    Time  6    Period  Weeks    Status  On-going    Target Date  11/14/17        PT Long Term Goals - 11/22/17 3557      PT LONG TERM GOAL #1   Title  Patient will be able to stand for 1 hour without increase in pain to demonstrate improved tolerance for ADLs.     Baseline  15 mins:     Time  12    Period  Weeks    Status  On-going    Target Date  12/19/17      PT LONG TERM GOAL #2   Title  Patient will reduce timed up and go to <11 seconds to reduce fall risk and demonstrate improved transfer/gait ability.    Baseline  26 sec, 10/24/17 18.83 sec;  11/22/17 14.86 sec    Time  12    Period  Weeks    Status  On-going    Target Date  12/19/17      PT LONG TERM GOAL #3   Title  Patient (> 30 years old) will complete five times sit to stand test in < 15 seconds  indicating an increased LE strength and improved balance.     Baseline  5x sit to stand in 25.73 seconds. , 22.63 sec 10/24/17;; 11/22/17 15.96    Time  12    Period  Weeks    Status  On-going    Target Date  12/19/17      PT LONG TERM GOAL #4   Title  Patient will increase six minute walk test distance to >600 for progression to community ambulator and improve gait ability    Baseline  474 ft, 710 feet;,790 feet    Time  12    Period  Weeks    Status  On-going    Target Date  12/18/17            Plan - 12/06/17 1110    Clinical Impression Statement  Patient is able to perform balance training using the balance master limits of stabiity and weight shifting activities. Patient has decreased dynamic and static balance deficits and continue to benefit from skilled PT to improve falls risk and improve mobility.     Rehab Potential  Good    PT Frequency  2x / week    PT Treatment/Interventions  Dry needling;Manual techniques;Balance training;Neuromuscular re-education;Patient/family education;Therapeutic exercise;Therapeutic activities;Functional mobility training;Stair training;Gait training;Moist Heat;Cryotherapy;Electrical Stimulation    PT Next Visit Plan  balance and strength training    Consulted and Agree with Plan of Care  Patient       Patient will benefit from skilled therapeutic intervention in order to improve the following deficits and impairments:  Abnormal gait, Decreased balance, Decreased endurance, Decreased mobility, Difficulty walking, Impaired sensation, Impaired tone, Decreased activity tolerance, Decreased strength, Decreased safety awareness, Pain  Visit Diagnosis: Muscle weakness  Unsteadiness  Abnormality of gait     Problem List Patient Active Problem List   Diagnosis Date Noted  . Rosacea   . Gait disturbance, post-stroke 10/05/2015  . Presbycusis of both ears   . Advanced care planning/counseling discussion 06/23/2015  . Impaired mobility 04/23/2015  . HTN (hypertension) 03/18/2015  . Chronic diastolic  heart failure, NYHA class 1 ([place]) 03/18/2015  . Spastic hemiparesis affecting dominant side ([place]) 07/07/2014  . Chest pressure 02/03/2014  . Medicare annual wellness visit, subsequent 11/06/2013  . Erectile dysfunction due to arterial insufficiency 02/05/2013  . MDD (major depressive disorder), recurrent episode, moderate ([place])   . Palpitations 06/26/2012  . HLD (hyperlipidemia)   . Restless leg syndrome   . GERD (gastroesophageal reflux disease)   . BPH with obstruction/lower urinary tract symptoms   . History of CVA (cerebrovascular accident) 03/11/2012  . Hemiparesis affecting right side as late effect of stroke ([place]) 03/11/2012  . Type 2 diabetes, controlled, with neuropathy ([place]) 03/11/2012  . OSA on CPAP 03/11/2012  . CKD (chronic kidney disease) stage 3, GFR 30-59 ml/min (HCC) 03/11/2012  . Migraines 03/11/2012    Alanson Puls, PT DPT 12/06/2017, 11:17 AM  [place] MAIN [facility] SERVICES [place], [name], [address] Phone: [phone]   Fax:  [phone]  Name: James RUAN, James Bell MRN: [id number] Date of Birth: 1931-03-23

## 2017-12-07 NOTE — Telephone Encounter (Signed)
Attempted to return pt's call.  Left message to call back.  Need to find out what are his concerns about the losartan recall.

## 2017-12-10 ENCOUNTER — Ambulatory Visit: Payer: Medicare Other | Admitting: Physical Therapy

## 2017-12-11 MED ORDER — VALSARTAN 40 MG PO TABS: 40.0000 mg | ORAL_TABLET | Freq: Every day | ORAL | 1 refills | Status: DC

## 2017-12-11 NOTE — Telephone Encounter (Signed)
I've sent in valsartan 40mg  to take in place of losartan 25mg  daily. He will need to watch blood pressures closely and ensure that they're not too low.

## 2017-12-11 NOTE — Telephone Encounter (Signed)
Spoke with pt about his concerns about the losartan recall.  Pt is asking if Dr. Darnell Level would send an alternative to [facility].  Pt gives permission to lvm on home phone # with Dr. Synthia Innocent response.

## 2017-12-12 ENCOUNTER — Ambulatory Visit: Payer: Medicare Other

## 2017-12-12 VITALS — BP 135/45 | HR 70

## 2017-12-12 DIAGNOSIS — R269 Unspecified abnormalities of gait and mobility: Secondary | ICD-10-CM | POA: Diagnosis not present

## 2017-12-12 DIAGNOSIS — R2681 Unsteadiness on feet: Secondary | ICD-10-CM

## 2017-12-12 DIAGNOSIS — M6281 Muscle weakness (generalized): Secondary | ICD-10-CM | POA: Diagnosis not present

## 2017-12-12 NOTE — Telephone Encounter (Signed)
Spoke with pt relaying message and instructions per Dr. G.  Pt verbalizes understanding and expresses his thanks. 

## 2017-12-12 NOTE — Therapy (Signed)
[place] MAIN [facility] SERVICES [address] [place], [name], [address] Phone: [phone]   Fax:  [phone]  Physical Therapy Treatment  Patient Details  Name: James BOZE, MD MRN: [id number] Date of Birth: 1930/12/16 Referring Provider: Charlett Blake    Encounter Date: 12/12/2017  PT End of Session - 12/12/17 0907    Visit Number  19    Number of Visits  25    Date for PT Re-Evaluation  12/19/17    PT Start Time  0910    PT Stop Time  1000    PT Time Calculation (min)  50 min    Equipment Utilized During Treatment  Gait belt    Activity Tolerance  Patient limited by fatigue;Patient tolerated treatment well    Behavior During Therapy  [facility] for tasks assessed/performed       Past Medical History:  Diagnosis Date  . Anemia   . Basal cell carcinoma 2018   R temple   . Basal cell carcinoma of nose 2002   bridge of nose  . BPH with obstruction/lower urinary tract symptoms    failed flomax, uroxatral, myrbetriq - sees urology [name]) pending videourodynamics  . CKD (chronic kidney disease) stage 3, GFR 30-59 ml/min (HCC) 03/11/2012   Renal US 02/2014 - multiple kidney cysts   . CVA (cerebral infarction) 03/07/12   slurred speech; right hemplegia, left handed - completed PT 07/2014 (17 sessions)  . Depression    mild, on cymbalta per prior PCP  . Erectile dysfunction    per urology (prostaglandin injection)  . GERD (gastroesophageal reflux disease)    with sliding HH  . H/O hiatal hernia   . History of kidney problems 12/2010   lacerated left kidney after fall  . HLD (hyperlipidemia)   . Kidney cysts 02/2014   bilateral (renal US)  . Migraines 03/11/12   now rare  . OSA on CPAP    sleep study 01/2014 - severe, CPAP at 10, previously on nuvigil (Dr. [name])  . Palpitations 01/2012   holter WNL, rare PAC/PVCs  . Presbycusis of both ears 2016   rec amplification [facility])  . Restless leg syndrome    well  controlled on mirapex  . Rosacea    Kowalski  . TIA (transient ischemic attack)   . Type 2 diabetes, controlled, with neuropathy [facility]) 2011   [name] in [facility]    Past Surgical History:  Procedure Laterality Date  . ABI  2007   WNL  . CARDIAC CATHETERIZATION  2004   normal; [place] [facility]  . CARDIOVASCULAR STRESS TEST  02/2014   WNL, low risk scan, EF 68%  . CAROTID ENDARTERECTOMY    . CATARACT EXTRACTION W/ INTRAOCULAR LENS IMPLANT  11//2012 - 08/2011  . COLONOSCOPY  03/2011   poor prep, unable to biopsy polyp in tic fold, rpt 3 mo [name])  . COLONOSCOPY  06/2011   mult polyps adenomatous, severe diverticulosis, rpt 3 yrs [name])  . CYSTOSCOPY WITH URETHRAL DILATATION N/A 07/24/2013   Procedure: CYSTOSCOPY WITH URETHRAL DILATATION;  Surgeon: [name], MD;  Location: [facility];  Service: Urology;  Laterality: N/A;  . ESOPHAGOGASTRODUODENOSCOPY  2006   duodenitis, medual HH, Grade 2 reflux, mild gastritis  . ESOPHAGOGASTRODUODENOSCOPY  07/2011   mild chronic gastritis  . [place]  . [place]; 2002   left, then repaired  . MRI brain  04/2010   WNL  . PROSTATE SURGERY  02/2009   PVP (laser)  . SKIN CANCER EXCISION  2002   bridge of nose, BCC  . TONSILLECTOMY AND ADENOIDECTOMY  ~ 1945  . TRANSURETHRAL INCISION OF PROSTATE N/A 07/24/2013   Procedure: TRANSURETHRAL INCISION OF THE PROSTATE (TUIP);  Surgeon: [name], MD;  Location: [facility];  Service: Urology;  Laterality: N/A;  . URETHRAL DILATION  2014   tannenbaum    Vitals:   12/12/17 0913  BP: (!) 135/45  Pulse: 70  SpO2: 99%    Subjective Assessment - 12/12/17 0907    Subjective  Pt reports that he is doing well today. No specific questions or concerns. He was feeling unwell Tuesday but is feeling better today. Denies pain at this time.     Pertinent History  P[place]et had CVA March 11 2012. He had rehab with hospital and SNF and then outpatient therapy. He wears a lumbar  corset to decrease his back pain if he is standing.     How long can you sit comfortably?  unlimited    How long can you stand comfortably?  10 or 15 mins    How long can you walk comfortably?  15 mins using a rollator outdoors.    Patient Stated Goals  Patient wants to be able to stand for longer periods and walk for longer periods.    Currently in Pain?  No/denies                No data recorded   TREATMENT  Neuromuscular Re-education Airex balance NBOS static balance followed by horizontal and vertical head turns; Standing on 1/2 bolster (Flat side up) static balance 60s x 2; Stepping over 1/2 foam roller increasing single leg stance on RLE to increase confidence;  Ther-ex  Step ups to 6" step from Airex pad alternating LE x 10 each; Quantum single leg press 60# 2 x 15 bilateral; Forward BOSU lunges leading with RLE x 15 in // bars;  CGA/minA+1 for all balance activities. Intermittent seated rest breaks provided. Cues for proper form/technique with exercise.            PT Education - 12/12/17 0907    Education provided  Yes    Education Details  exercise form/technique    Person(s) Educated  Patient    Methods  Explanation    Comprehension  Verbalized understanding       PT Short Term Goals - 10/24/17 0858      PT SHORT TERM GOAL #1   Title  Patient will ascend and descend step without railing and use of hemiwalker without loss of balance to be able to safely get into his house.    Baseline  Patient continues to use the railing with steps    Time  6    Period  Weeks    Status  On-going    Target Date  11/14/17        PT Long Term Goals - 11/22/17 2694      PT LONG TERM GOAL #1   Title  Patient will be able to stand for 1 hour without increase in pain to demonstrate improved tolerance for ADLs.     Baseline  15 mins:     Time  12    Period  Weeks    Status  On-going    Target Date  12/19/17      PT LONG TERM GOAL #2   Title  Patient will  reduce timed up and go to <11 seconds  to reduce fall risk and demonstrate improved transfer/gait ability.    Baseline  26 sec, 10/24/17 18.83 sec;  11/22/17 14.86 sec    Time  12    Period  Weeks    Status  On-going    Target Date  12/19/17      PT LONG TERM GOAL #3   Title  Patient (> 7 years old) will complete five times sit to stand test in < 15 seconds indicating an increased LE strength and improved balance.    Baseline  5x sit to stand in 25.73 seconds. 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 He demonstrates excellent motivation with therapy on this date. He does an excellent job with strengthenign exercises such as leg press and step-ups in parallel bars. Pt with improved confidence on RLE as session progresses. Pt encouraged to continue HEP and follow-up as scheduled.    Rehab Potential  Good    PT Frequency  2x / week    PT Treatment/Interventions  Dry needling;Manual techniques;Balance training;Neuromuscular re-education;Patient/family education;Therapeutic exercise;Therapeutic activities;Functional mobility training;Stair training;Gait training;Moist Heat;Cryotherapy;Electrical Stimulation    PT Next Visit Plan  balance and strength training    Consulted and Agree with Plan of Care  Patient       Patient will benefit from skilled therapeutic intervention in order to improve the following deficits and impairments:  Abnormal gait, Decreased balance, Decreased endurance, Decreased mobility, Difficulty walking, Impaired  sensation, Impaired tone, Decreased activity tolerance, Decreased strength, Decreased safety awareness, Pain  Visit Diagnosis: Muscle weakness  Unsteadiness     Problem List Patient Active Problem List   Diagnosis Date Noted  . Rosacea   . Gait disturbance, post-stroke 10/05/2015  . Presbycusis of both ears   . Advanced care planning/counseling discussion 06/23/2015  . Impaired mobility 04/23/2015  . HTN (hypertension) 03/18/2015  . Chronic diastolic heart failure, NYHA class 1 ([place]) 03/18/2015  . Spastic hemiparesis affecting dominant side ([place]) 07/07/2014  . Chest pressure 02/03/2014  . Medicare annual wellness visit, subsequent 11/06/2013  . Erectile dysfunction due to arterial insufficiency 02/05/2013  . MDD (major depressive disorder), recurrent episode, moderate ([place])   . Palpitations 06/26/2012  . HLD (hyperlipidemia)   . Restless leg syndrome   . GERD (gastroesophageal reflux disease)   . BPH with obstruction/lower urinary tract symptoms   . History of CVA (cerebrovascular accident) 03/11/2012  . Hemiparesis affecting right side as late effect of stroke ([place]) 03/11/2012  . Type 2 diabetes, controlled, with neuropathy ([place]) 03/11/2012  . OSA on CPAP 03/11/2012  . CKD (chronic kidney disease) stage 3, GFR 30-59 ml/min (HCC) 03/11/2012  . Migraines 03/11/2012   Phillips Grout PT, DPT   James Bell 12/12/2017, 10:26 AM  [place] MAIN [facility] SERVICES [address]  [place], [name], [address] Phone: [phone]   Fax:  [phone]  Name: James LANDIN, MD MRN: [id number] Date of Birth: 05-21-1931

## 2017-12-14 ENCOUNTER — Other Ambulatory Visit: Payer: Self-pay

## 2017-12-14 ENCOUNTER — Ambulatory Visit (HOSPITAL_BASED_OUTPATIENT_CLINIC_OR_DEPARTMENT_OTHER): Payer: Medicare Other | Admitting: Physical Medicine & Rehabilitation

## 2017-12-14 ENCOUNTER — Encounter: Payer: Self-pay | Admitting: Physical Medicine & Rehabilitation

## 2017-12-14 ENCOUNTER — Encounter: Payer: Medicare Other | Attending: Physical Medicine & Rehabilitation

## 2017-12-14 VITALS — BP 128/73 | HR 75 | Ht 68.0 in | Wt 190.0 lb

## 2017-12-14 DIAGNOSIS — I69398 Other sequelae of cerebral infarction: Secondary | ICD-10-CM

## 2017-12-14 DIAGNOSIS — R269 Unspecified abnormalities of gait and mobility: Secondary | ICD-10-CM

## 2017-12-14 DIAGNOSIS — R252 Cramp and spasm: Secondary | ICD-10-CM | POA: Insufficient documentation

## 2017-12-14 DIAGNOSIS — G8110 Spastic hemiplegia affecting unspecified side: Secondary | ICD-10-CM | POA: Diagnosis not present

## 2017-12-14 NOTE — Patient Instructions (Signed)
Botox next visit   Cont Home exercises when PT stops

## 2017-12-14 NOTE — Progress Notes (Signed)
Subjective:    Patient ID: James Spurr, MD, male    DOB: 1930-11-20, 84 y.o.   MRN: [id number] 84 year old right-handed male,  works as an Chief Strategy Officer. Admitted March 11, 2012, with progressive  right-sided weakness. MRI revealed moderate-size acute infarct  affecting the left posterior putamen and left periventricular white  matter. MRA of the head was unremarkable. Carotid Dopplers with no ICA  stenosis. Echocardiogram with ejection fraction of 65% and grade 1  diastolic dysfunction. HPI Able to shake hands post botox able to grasp and release and extend R elbow  11/02/2017 Biceps75 FCR25  FDS25  Lumbrical 1,2 and 3, 12.5 Units each=37.5  FDP with estim to Identify DIP flexion digits 2 and 3, 12.5 units injected No falls or injuries  Attending OP PT at [facility]  RLS improved with gabapentin TID Pain Inventory Average Pain 0 Pain Right Now 0 My pain is no pain  In the last 24 hours, has pain interfered with the following? General activity 0 Relation with others 0 Enjoyment of life 0 What TIME of day is your pain at its worst? no pain Sleep (in general) NA  Pain is worse with: no pain Pain improves with: no pain Relief from Meds: no pain  Mobility use a cane ability to climb steps?  yes do you drive?  yes  Function retired  Neuro/Psych No problems in this area  Prior Studies Any changes since last visit?  no  Physicians involved in your care Any changes since last visit?  no   Family History  Problem Relation Age of Onset  . Stroke Father   . Lung cancer Father 50        smoker  . Stroke Brother   . Diabetes Brother 65  . Lung cancer Brother   . Alcoholism Sister   . Kidney disease Sister   . Coronary artery disease Neg Hx    Social History   Socioeconomic History  . Marital status: Unknown    Spouse name: Not on file  . Number of children: 2  . Years of education: MD  . Highest education level: Not on file  Occupational History   . Occupation: retired    Fish farm manager: RETIRED  Social Needs  . Financial resource strain: Not on file  . Food insecurity:    Worry: Not on file    Inability: Not on file  . Transportation needs:    Medical: Not on file    Non-medical: Not on file  Tobacco Use  . Smoking status: Never Smoker  . Smokeless tobacco: Never Used  . Tobacco comment: "used to smoke pipe when I was younger"  Substance and Sexual Activity  . Alcohol use: Yes    Alcohol/week: 2.4 oz    Types: 2 Glasses of wine, 2 Shots of liquor per week    Comment: occasional  . Drug use: No  . Sexual activity: Yes  Lifestyle  . Physical activity:    Days per week: Not on file    Minutes per session: Not on file  . Stress: Not on file  Relationships  . Social connections:    Talks on phone: Not on file    Gets together: Not on file    Attends religious service: Not on file    Active member of club or organization: Not on file    Attends meetings of clubs or organizations: Not on file    Relationship status: Not on file  Other Topics Concern  . Not  on file  Social History Narrative   Caffeine: 1 cup coffee/day    Occupation: ophthalmologist, retired   Edu: MD   Activity: going to gym 3x/wk with trainer   Diet: some water, fruits/vegetables some, red meat a few times, fish 1x/wk   POA - [name] (Daughter)   Past Surgical History:  Procedure Laterality Date  . ABI  2007   WNL  . CARDIAC CATHETERIZATION  2004   normal; [place] [facility]  . CARDIOVASCULAR STRESS TEST  02/2014   WNL, low risk scan, EF 68%  . CAROTID ENDARTERECTOMY    . CATARACT EXTRACTION W/ INTRAOCULAR LENS IMPLANT  11//2012 - 08/2011  . COLONOSCOPY  03/2011   poor prep, unable to biopsy polyp in tic fold, rpt 3 mo [name])  . COLONOSCOPY  06/2011   mult polyps adenomatous, severe diverticulosis, rpt 3 yrs [name])  . CYSTOSCOPY WITH URETHRAL DILATATION N/A 07/24/2013   Procedure: CYSTOSCOPY WITH URETHRAL DILATATION;  Surgeon: [name], MD;  [facility];  Service: Urology;  Laterality: N/A;  . ESOPHAGOGASTRODUODENOSCOPY  2006   duodenitis, medual HH, Grade 2 reflux, mild gastritis  . ESOPHAGOGASTRODUODENOSCOPY  07/2011   mild chronic gastritis  . [place]  . [place]; 2002   left, then repaired  . MRI brain  04/2010   WNL  . PROSTATE SURGERY  02/2009   PVP (laser)  . SKIN CANCER EXCISION  2002   bridge of nose, BCC  . TONSILLECTOMY AND ADENOIDECTOMY  ~ 1945  . TRANSURETHRAL INCISION OF PROSTATE N/A 07/24/2013   Procedure: TRANSURETHRAL INCISION OF THE PROSTATE (TUIP);  Surgeon: [name], MD;  [facility];  Service: Urology;  Laterality: N/A;  . URETHRAL DILATION  2014   tannenbaum   Past Medical History:  Diagnosis Date  . Anemia   . Basal cell carcinoma 2018   R temple   . Basal cell carcinoma of nose 2002   bridge of nose  . BPH with obstruction/lower urinary tract symptoms    failed flomax, uroxatral, myrbetriq - sees urology [name]) pending videourodynamics  . CKD (chronic kidney disease) stage 3, GFR 30-59 ml/min (HCC) 03/11/2012   Renal US 02/2014 - multiple kidney cysts   . CVA (cerebral infarction) 03/07/12   slurred speech; right hemplegia, left handed - completed PT 07/2014 (17 sessions)  . Depression    mild, on cymbalta per prior PCP  . Erectile dysfunction    per urology (prostaglandin injection)  . GERD (gastroesophageal reflux disease)    with sliding HH  . H/O hiatal hernia   . History of kidney problems 12/2010   lacerated left kidney after fall  . HLD (hyperlipidemia)   . Kidney cysts 02/2014   bilateral (renal US)  . Migraines 03/11/12   now rare  . OSA on CPAP    sleep study 01/2014 - severe, CPAP at 10, previously on nuvigil (Dr. [name])  . Palpitations 01/2012   holter WNL, rare PAC/PVCs  . Presbycusis of both ears 2016   rec amplification [facility])  . Restless leg syndrome    well controlled on mirapex    . Rosacea    Kowalski  . TIA (transient ischemic attack)   . Type 2 diabetes, controlled, with neuropathy ([place]) 2011   [name] in [place]   BP 128/73   Pulse 75   Ht 5\' 8"  (1.727 m) Comment: pt reported  Wt 190 lb (86.2 kg) Comment: pt reported  SpO2 96%   BMI 28.89 kg/m   Opioid Risk Score:   Fall Risk Score:  `1  Depression screen PHQ 2/9  Depression screen [facility] 2/9 12/14/2017 07/18/2017 07/17/2016 01/17/2016 10/05/2015 06/23/2015 04/08/2015  Decreased Interest 0 0 0 0 0 0 0  Down, Depressed, Hopeless 1 1 0 0 0 0 0  PHQ - 2 Score 1 1 0 0 0 0 0  Altered sleeping - 2 - - - - -  Tired, decreased energy - 2 - - - - -  Change in appetite - 1 - - - - -  Feeling bad or failure about yourself  - 1 - - - - -  Trouble concentrating - 1 - - - - -  Moving slowly or fidgety/restless - 0 - - - - -  Suicidal thoughts - 0 - - - - -  PHQ-9 Score - 8 - - - - -  Some recent data might be hidden    Review of Systems  Constitutional: Negative.   HENT: Negative.   Eyes: Negative.   Respiratory: Negative.   Cardiovascular: Negative.   Gastrointestinal: Negative.   Endocrine: Negative.   Genitourinary: Negative.   Musculoskeletal: Negative.   Skin: Negative.   Allergic/Immunologic: Negative.   Neurological: Negative.   Hematological: Negative.   Psychiatric/Behavioral: Negative.   All other systems reviewed and are negative.      Objective:   Physical Exam  Constitutional: He is oriented to person, place, and time. He appears well-developed and well-nourished. No distress.  HENT:  Head: Normocephalic and atraumatic.  Eyes: Pupils are equal, round, and reactive to light.  Neck: Normal range of motion.  Musculoskeletal:  Swan neck deformity Right 2nd and 3rd digits  Neurological: He is alert and oriented to person, place, and time. No sensory deficit. Coordination abnormal.  Skin: Skin is warm and dry. He is not diaphoretic.  Psychiatric: He has a normal mood and affect. His behavior  is normal. Judgment and thought content normal.  Nursing note and vitals reviewed.   Able to oppose thumb to fingers on Right    MAS 1 at Right FF, WF, Elbow flexors  Motors 4/5 R HF, KE, 3-R ADF  3- Right deltoid  3 R biceps , triceps finger flexors and extensors  Gait is with hemiwalker and Right AFO clears toes on Right no knee hyper ext    Assessment & Plan:  1.  Right spastic hemiplegia with good response to RUE botox injection  Would recommend current dose and muscle group selection

## 2017-12-17 ENCOUNTER — Ambulatory Visit: Payer: Medicare Other | Attending: Physical Medicine & Rehabilitation | Admitting: Physical Therapy

## 2017-12-17 ENCOUNTER — Ambulatory Visit: Payer: Medicare Other | Admitting: Physical Therapy

## 2017-12-17 ENCOUNTER — Encounter: Payer: Self-pay | Admitting: Physical Therapy

## 2017-12-17 DIAGNOSIS — M545 Low back pain: Secondary | ICD-10-CM | POA: Diagnosis not present

## 2017-12-17 DIAGNOSIS — R269 Unspecified abnormalities of gait and mobility: Secondary | ICD-10-CM | POA: Diagnosis not present

## 2017-12-17 DIAGNOSIS — R2681 Unsteadiness on feet: Secondary | ICD-10-CM

## 2017-12-17 DIAGNOSIS — M6281 Muscle weakness (generalized): Secondary | ICD-10-CM | POA: Diagnosis not present

## 2017-12-17 DIAGNOSIS — M25512 Pain in left shoulder: Secondary | ICD-10-CM | POA: Insufficient documentation

## 2017-12-17 DIAGNOSIS — G8929 Other chronic pain: Secondary | ICD-10-CM | POA: Diagnosis present

## 2017-12-17 NOTE — Therapy (Signed)
[place] MAIN [facility] SERVICES [address] [place], [name], [address] Phone: [phone]   Fax:  [phone]  Physical Therapy Treatment  Patient Details  Name: James CAPISTRAN, MD MRN: [id number] Date of Birth: 1931/03/18 Referring Provider: Charlett Blake    Encounter Date: 12/17/2017  PT End of Session - 12/17/17 0835    Visit Number  20    Number of Visits  25    Date for PT Re-Evaluation  12/19/17    PT Start Time  0950    PT Stop Time  1030    PT Time Calculation (min)  40 min    Equipment Utilized During Treatment  Gait belt    Activity Tolerance  Patient limited by fatigue;Patient tolerated treatment well    Behavior During Therapy  [facility] for tasks assessed/performed       Past Medical History:  Diagnosis Date  . Anemia   . Basal cell carcinoma 2018   R temple   . Basal cell carcinoma of nose 2002   bridge of nose  . BPH with obstruction/lower urinary tract symptoms    failed flomax, uroxatral, myrbetriq - sees urology [name]) pending videourodynamics  . CKD (chronic kidney disease) stage 3, GFR 30-59 ml/min (HCC) 03/11/2012   Renal US 02/2014 - multiple kidney cysts   . CVA (cerebral infarction) 03/07/12   slurred speech; right hemplegia, left handed - completed PT 07/2014 (17 sessions)  . Depression    mild, on cymbalta per prior PCP  . Erectile dysfunction    per urology (prostaglandin injection)  . GERD (gastroesophageal reflux disease)    with sliding HH  . H/O hiatal hernia   . History of kidney problems 12/2010   lacerated left kidney after fall  . HLD (hyperlipidemia)   . Kidney cysts 02/2014   bilateral (renal US)  . Migraines 03/11/12   now rare  . OSA on CPAP    sleep study 01/2014 - severe, CPAP at 10, previously on nuvigil (Dr. [name])  . Palpitations 01/2012   holter WNL, rare PAC/PVCs  . Presbycusis of both ears 2016   rec amplification [facility])  . Restless leg syndrome    well  controlled on mirapex  . Rosacea    Kowalski  . TIA (transient ischemic attack)   . Type 2 diabetes, controlled, with neuropathy [facility]) 2011   [name] in [facility]    Past Surgical History:  Procedure Laterality Date  . ABI  2007   WNL  . CARDIAC CATHETERIZATION  2004   normal; [place] [facility]  . CARDIOVASCULAR STRESS TEST  02/2014   WNL, low risk scan, EF 68%  . CAROTID ENDARTERECTOMY    . CATARACT EXTRACTION W/ INTRAOCULAR LENS IMPLANT  11//2012 - 08/2011  . COLONOSCOPY  03/2011   poor prep, unable to biopsy polyp in tic fold, rpt 3 mo [name])  . COLONOSCOPY  06/2011   mult polyps adenomatous, severe diverticulosis, rpt 3 yrs [name])  . CYSTOSCOPY WITH URETHRAL DILATATION N/A 07/24/2013   Procedure: CYSTOSCOPY WITH URETHRAL DILATATION;  Surgeon: [name], MD;  Location: [facility];  Service: Urology;  Laterality: N/A;  . ESOPHAGOGASTRODUODENOSCOPY  2006   duodenitis, medual HH, Grade 2 reflux, mild gastritis  . ESOPHAGOGASTRODUODENOSCOPY  07/2011   mild chronic gastritis  . [place]  . [place]; 2002   left, then repaired  . MRI brain  04/2010   WNL  . PROSTATE SURGERY  02/2009   PVP (laser)  . SKIN CANCER EXCISION  2002   bridge of nose, BCC  . TONSILLECTOMY AND ADENOIDECTOMY  ~ 1945  . TRANSURETHRAL INCISION OF PROSTATE N/A 07/24/2013   Procedure: TRANSURETHRAL INCISION OF THE PROSTATE (TUIP);  Surgeon: [name], MD;  Location: [facility];  Service: Urology;  Laterality: N/A;  . URETHRAL DILATION  2014   tannenbaum    There were no vitals filed for this visit.  Subjective Assessment - 12/17/17 1141    Subjective  Pt reports that he is doing well today. No specific questions or concerns. Reports no pain.     Pertinent History  Patinet had CVA March 11 2012. He had rehab with hospital and SNF and then outpatient therapy. He wears a lumbar corset to decrease his back pain if he is standing.     How long can you sit comfortably?   unlimited    How long can you stand comfortably?  10 or 15 mins    How long can you walk comfortably?  15 mins using a rollator outdoors.    Patient Stated Goals  Patient wants to be able to stand for longer periods and walk for longer periods.    Currently in Pain?  No/denies    Pain Score  0-No pain    Pain Onset  Today       Neuromuscular training:   Standing on airex unsupported: Side step up on 4 inch step and back to airex  X 15 reps unsupported,  Side step up on 4 inch step with  BUE ball up/down to challenge coordination with dual tasks; One LE on airex, one LE stepping stone , standing unsupported with head turns side/side, up/down x5 reps   Required CGA to min A for balance control  stepping over 1/2 bolsters  , each direction with CGA for safety and cues to improve step length for better balance challenge; Required cues to slow down eccentric return for better balance control  Standing on foam ::Feet in neutral, BUE theraball  x10 reps with min A for safety; Standing on  Foam unsupported cone pass across midline  x3 reps, x2 sets  with min A for safety and cues to improve weight shift and trunk control for better balance in narrow base of support   Resisted walking:12.5# forward/backward, side/side left and right  X 3  Laps   Rockerboard: Forward/backward, side/side controlled rock x 2 min each with rail assist for safety and cues to improve weight shift during gait. Side to side controlled rock with min assist for safety and cues to keep posture upright   Patient tolerated session well; denies any increase in pain but does report some fatigue at end of session.                            PT Education - 12/17/17 0835    Education provided  Yes    Education Details  safety with balance    Person(s) Educated  Patient    Methods  Explanation;Demonstration;Tactile cues    Comprehension  Verbalized understanding;Returned demonstration;Verbal cues  required       PT Short Term Goals - 10/24/17 0858      PT SHORT TERM GOAL #1   Title  Patient will ascend and descend step without railing and use of hemiwalker without loss of balance to be able to safely get into his house.    Baseline  Patient continues to use the railing with steps    Time  6    Period  Weeks    Status  On-going    Target Date  11/14/17        PT Long Term Goals - 11/22/17 1540      PT LONG TERM GOAL #1   Title  Patient will be able to stand for 1 hour without increase in pain to demonstrate improved tolerance for ADLs.     Baseline  15 mins:     Time  12    Period  Weeks    Status  On-going    Target Date  12/19/17      PT LONG TERM GOAL #2   Title  Patient will reduce timed up and go to <11 seconds to reduce fall risk and demonstrate improved transfer/gait ability.    Baseline  26 sec, 10/24/17 18.83 sec;  11/22/17 14.86 sec    Time  12    Period  Weeks    Status  On-going    Target Date  12/19/17      PT LONG TERM GOAL #3   Title  Patient (> 76 years old) will complete five times sit to stand test in < 15 seconds indicating an increased LE strength and improved balance.    Baseline  5x sit to stand in 25.73 seconds. , 22.63 sec 10/24/17;; 11/22/17 15.96    Time  12    Period  Weeks    Status  On-going    Target Date  12/19/17      PT LONG TERM GOAL #4   Title  Patient will increase six minute walk test distance to >600 for progression to community ambulator and improve gait ability    Baseline  474 ft, 710 feet;,790 feet    Time  12    Period  Weeks    Status  On-going    Target Date  12/18/17            Plan - 12/17/17 0842    Clinical Impression Statement  Pt requires direction and verbal cues for correct performance of exercises.  Pt was able to perform exercises today, noting poor endurance and poor dynamic standing tolerance.  Pt was able to perform all exercises with min assist and VC for technique.  Patient struggles with speed during  movement as well as balance with unstable surfaces. He Pt encouraged to continue HEP .Follow-up as scheduled.    Rehab Potential  Good    PT Frequency  2x / week    PT Treatment/Interventions  Dry needling;Manual techniques;Balance training;Neuromuscular re-education;Patient/family education;Therapeutic exercise;Therapeutic activities;Functional mobility training;Stair training;Gait training;Moist Heat;Cryotherapy;Electrical Stimulation    PT Next Visit Plan  balance and strength training    Consulted and Agree with Plan of Care  Patient       Patient will benefit from skilled therapeutic intervention in order to improve the following deficits and impairments:  Abnormal gait, Decreased balance, Decreased endurance, Decreased mobility, Difficulty walking, Impaired sensation, Impaired tone, Decreased activity tolerance, Decreased strength, Decreased safety awareness, Pain  Visit Diagnosis: Muscle weakness  Unsteadiness  Abnormality of gait     Problem List Patient Active Problem List   Diagnosis Date Noted  . Rosacea   . Gait disturbance, post-stroke 10/05/2015  . Presbycusis of both ears   . Advanced care planning/counseling discussion 06/23/2015  . Impaired mobility 04/23/2015  . HTN (hypertension) 03/18/2015  . Chronic diastolic heart failure, NYHA class 1 ([place])  03/18/2015  . Spastic hemiparesis affecting dominant side ([place]) 07/07/2014  . Chest pressure 02/03/2014  . Medicare annual wellness visit, subsequent 11/06/2013  . Erectile dysfunction due to arterial insufficiency 02/05/2013  . MDD (major depressive disorder), recurrent episode, moderate ([place])   . Palpitations 06/26/2012  . HLD (hyperlipidemia)   . Restless leg syndrome   . GERD (gastroesophageal reflux disease)   . BPH with obstruction/lower urinary tract symptoms   . History of CVA (cerebrovascular accident) 03/11/2012  . Hemiparesis affecting right side as late effect of stroke ([place]) 03/11/2012  . Type 2  diabetes, controlled, with neuropathy ([place]) 03/11/2012  . OSA on CPAP 03/11/2012  . CKD (chronic kidney disease) stage 3, GFR 30-59 ml/min (HCC) 03/11/2012  . Migraines 03/11/2012    Alanson Puls, PT DPT 12/17/2017, 12:08 PM  [place] MAIN [facility] SERVICES [place], [name], [address] Phone: [phone]   Fax:  [phone]  Name: WILLMAR STOCKINGER, MD MRN: [id number] Date of Birth: 11/23/1930

## 2017-12-18 ENCOUNTER — Ambulatory Visit: Payer: Medicare Other | Admitting: Physical Therapy

## 2017-12-20 ENCOUNTER — Ambulatory Visit: Payer: Medicare Other | Admitting: Physical Therapy

## 2017-12-20 ENCOUNTER — Encounter: Payer: Self-pay | Admitting: Physical Therapy

## 2017-12-20 DIAGNOSIS — R269 Unspecified abnormalities of gait and mobility: Secondary | ICD-10-CM

## 2017-12-20 DIAGNOSIS — M6281 Muscle weakness (generalized): Secondary | ICD-10-CM

## 2017-12-20 DIAGNOSIS — M25512 Pain in left shoulder: Secondary | ICD-10-CM

## 2017-12-20 DIAGNOSIS — R2681 Unsteadiness on feet: Secondary | ICD-10-CM | POA: Diagnosis not present

## 2017-12-20 DIAGNOSIS — M545 Low back pain: Secondary | ICD-10-CM | POA: Diagnosis not present

## 2017-12-20 DIAGNOSIS — G8929 Other chronic pain: Secondary | ICD-10-CM

## 2017-12-20 NOTE — Therapy (Signed)
[place] MAIN [facility] SERVICES [address] [place], [name], [address] Phone: [phone]   Fax:  [phone]  Physical Therapy Treatment  Patient Details  Name: James WEIDEMAN, James Bell MRN: [id number] Date of Birth: September 15, 1931 Referring Provider: Charlett Blake    Encounter Date: 12/20/2017  PT End of Session - 12/20/17 1034    Visit Number  21    Number of Visits  25    Date for PT Re-Evaluation  02/14/18    Equipment Utilized During Treatment  Gait belt    Activity Tolerance  Patient limited by fatigue;Patient tolerated treatment well    Behavior During Therapy  [facility] for tasks assessed/performed       Past Medical History:  Diagnosis Date  . Anemia   . Basal cell carcinoma 2018   R temple   . Basal cell carcinoma of nose 2002   bridge of nose  . BPH with obstruction/lower urinary tract symptoms    failed flomax, uroxatral, myrbetriq - sees urology [name]) pending videourodynamics  . CKD (chronic kidney disease) stage 3, GFR 30-59 ml/min (HCC) 03/11/2012   Renal US 02/2014 - multiple kidney cysts   . CVA (cerebral infarction) 03/07/12   slurred speech; right hemplegia, left handed - completed PT 07/2014 (17 sessions)  . Depression    mild, on cymbalta per prior PCP  . Erectile dysfunction    per urology (prostaglandin injection)  . GERD (gastroesophageal reflux disease)    with sliding HH  . H/O hiatal hernia   . History of kidney problems 12/2010   lacerated left kidney after fall  . HLD (hyperlipidemia)   . Kidney cysts 02/2014   bilateral (renal US)  . Migraines 03/11/12   now rare  . OSA on CPAP    sleep study 01/2014 - severe, CPAP at 10, previously on nuvigil (Dr. [name])  . Palpitations 01/2012   holter WNL, rare PAC/PVCs  . Presbycusis of both ears 2016   rec amplification [facility])  . Restless leg syndrome    well controlled on mirapex  . Rosacea    Kowalski  . TIA (transient ischemic attack)   . Type  2 diabetes, controlled, with neuropathy [facility]) 2011   [name] in [facility]    Past Surgical History:  Procedure Laterality Date  . ABI  2007   WNL  . CARDIAC CATHETERIZATION  2004   normal; [place] [facility]  . CARDIOVASCULAR STRESS TEST  02/2014   WNL, low risk scan, EF 68%  . CAROTID ENDARTERECTOMY    . CATARACT EXTRACTION W/ INTRAOCULAR LENS IMPLANT  11//2012 - 08/2011  . COLONOSCOPY  03/2011   poor prep, unable to biopsy polyp in tic fold, rpt 3 mo [name])  . COLONOSCOPY  06/2011   mult polyps adenomatous, severe diverticulosis, rpt 3 yrs [name])  . CYSTOSCOPY WITH URETHRAL DILATATION N/A 07/24/2013   Procedure: CYSTOSCOPY WITH URETHRAL DILATATION;  Surgeon: [name], James Bell;  [facility];  Service: Urology;  Laterality: N/A;  . ESOPHAGOGASTRODUODENOSCOPY  2006   duodenitis, medual HH, Grade 2 reflux, mild gastritis  . ESOPHAGOGASTRODUODENOSCOPY  07/2011   mild chronic gastritis  . [place]  . [place]; 2002   left, then repaired  . MRI brain  04/2010   WNL  . PROSTATE SURGERY  02/2009   PVP (laser)  . SKIN CANCER EXCISION  2002   bridge of nose, BCC  . TONSILLECTOMY AND ADENOIDECTOMY  ~  [place] N/A 07/24/2013   Procedure: TRANSURETHRAL INCISION OF THE PROSTATE (TUIP);  Surgeon: [name], James Bell;  Location: [facility];  Service: Urology;  Laterality: N/A;  . URETHRAL DILATION  2014   tannenbaum    There were no vitals filed for this visit.  Subjective Assessment - 12/20/17 1032    Subjective  Pt reports that he is doing well today. No specific questions or concerns. Reports no pain.     Pertinent History  Patinet had CVA March 11 2012. He had rehab with hospital and SNF and then outpatient therapy. He wears a lumbar corset to decrease his back pain if he is standing.     How long can you sit comfortably?  unlimited    How long can you stand comfortably?  10 or 15 mins    How long can you walk  comfortably?  15 mins using a rollator outdoors.    Patient Stated Goals  Patient wants to be able to stand for longer periods and walk for longer periods.    Currently in Pain?  No/denies    Pain Score  0-No pain    Pain Onset  Today       Therapeutic activities:  Outcome measures performed with improvements in TUG, 5 x sit to stand and 6 MW test Patient reports shoulder pain with movement in flex and abd greater than shoulder height   PROM to left shoulder/ AAROM to left shoulder  Pain at 90 degs flex and abd prior to STM to left shoulder and scapula mobilization STM to thoracic musculature and scapula musculature  Therapeutic exercise inculding sidelying left shoulder flex gravity eliminated position   AROM to left shoulder following treatment with pain in left shoulder flex at 150 deg and 160 deg abd                   PT Education - 12/20/17 1033    Education provided  Yes    Education Details  saftey with HEP    Person(s) Educated  Patient    Methods  Explanation    Comprehension  Verbalized understanding       PT Short Term Goals - 10/24/17 0858      PT SHORT TERM GOAL #1   Title  Patient will ascend and descend step without railing and use of hemiwalker without loss of balance to be able to safely get into his house.    Baseline  Patient continues to use the railing with steps    Time  6    Period  Weeks    Status  On-going    Target Date  11/14/17        PT Long Term Goals - 12/20/17 1038      PT LONG TERM GOAL #1   Title  Patient will be able to stand for 1 hour without increase in pain to demonstrate improved tolerance for ADLs.     Baseline  15 mins:     Time  12    Period  Weeks    Status  On-going      PT LONG TERM GOAL #2   Title  Patient will reduce timed up and go to <11 seconds to reduce fall risk and demonstrate improved transfer/gait ability.    Baseline  26 sec, 10/24/17 18.83 sec;  11/22/17 14.86 sec, 12/20/17= 13.71    Time  12     Period  Weeks  Status  On-going      PT LONG TERM GOAL #3   Title  Patient (> 82 years old) will complete five times sit to stand test in < 15 seconds indicating an increased LE strength and improved balance.    Baseline  5x sit to stand in 25.73 seconds. , 22.63 sec 10/24/17;; 11/22/17 15.96, 4/3 19 15.45    Time  12    Period  Weeks    Status  On-going      PT LONG TERM GOAL #4   Title  Patient will increase six minute walk test distance to >600 for progression to community ambulator and improve gait ability    Baseline  474 ft, 710 feet;,790 feet, 775 feet    Time  12    Period  Weeks    Status  On-going            Plan - 12/20/17 1318    Clinical Impression Statement  Patient performs outcome measures with improvements in 5 x sit to stand, TUG, 6MW. He has left shoudler pain today with pain in abd and flex above 90 degs. He responds to [facility] and scapula mobilization with decreased pain and increased AROM following treatment . He will continue to benefit from skilled PT to improve strength, decrease L shoulder pain, improve balance and continue to decrease falls risk.     Rehab Potential  Good    PT Frequency  2x / week    PT Treatment/Interventions  Dry needling;Manual techniques;Balance training;Neuromuscular re-education;Patient/family education;Therapeutic exercise;Therapeutic activities;Functional mobility training;Stair training;Gait training;Moist Heat;Cryotherapy;Electrical Stimulation    PT Next Visit Plan  balance and strength training    Consulted and Agree with Plan of Care  Patient       Patient will benefit from skilled therapeutic intervention in order to improve the following deficits and impairments:  Abnormal gait, Decreased balance, Decreased endurance, Decreased mobility, Difficulty walking, Impaired sensation, Impaired tone, Decreased activity tolerance, Decreased strength, Decreased safety awareness, Pain  Visit Diagnosis: Muscle  weakness  Unsteadiness  Abnormality of gait  Chronic left shoulder pain     Problem List Patient Active Problem List   Diagnosis Date Noted  . Rosacea   . Gait disturbance, post-stroke 10/05/2015  . Presbycusis of both ears   . Advanced care planning/counseling discussion 06/23/2015  . Impaired mobility 04/23/2015  . HTN (hypertension) 03/18/2015  . Chronic diastolic heart failure, NYHA class 1 ([place]) 03/18/2015  . Spastic hemiparesis affecting dominant side ([place]) 07/07/2014  . Chest pressure 02/03/2014  . Medicare annual wellness visit, subsequent 11/06/2013  . Erectile dysfunction due to arterial insufficiency 02/05/2013  . MDD (major depressive disorder), recurrent episode, moderate ([place])   . Palpitations 06/26/2012  . HLD (hyperlipidemia)   . Restless leg syndrome   . GERD (gastroesophageal reflux disease)   . BPH with obstruction/lower urinary tract symptoms   . History of CVA (cerebrovascular accident) 03/11/2012  . Hemiparesis affecting right side as late effect of stroke ([place]) 03/11/2012  . Type 2 diabetes, controlled, with neuropathy ([place]) 03/11/2012  . OSA on CPAP 03/11/2012  . CKD (chronic kidney disease) stage 3, GFR 30-59 ml/min (HCC) 03/11/2012  . Migraines 03/11/2012    Alanson Puls, PT DPT 12/20/2017, 1:22 PM  [place] [facility] MAIN [facility] SERVICES [address] [place], [name], [address] Phone: [phone]   Fax:  [phone]  Name: James MARKOS, James Bell MRN: [id number] Date of Birth: [birth year]

## 2017-12-24 ENCOUNTER — Ambulatory Visit: Payer: Medicare Other | Admitting: Physical Therapy

## 2017-12-25 ENCOUNTER — Ambulatory Visit: Payer: Medicare Other | Admitting: Physical Therapy

## 2017-12-27 ENCOUNTER — Ambulatory Visit: Payer: Medicare Other | Admitting: Physical Therapy

## 2017-12-27 ENCOUNTER — Encounter: Payer: Self-pay | Admitting: Physical Therapy

## 2017-12-27 DIAGNOSIS — M545 Low back pain, unspecified: Secondary | ICD-10-CM

## 2017-12-27 DIAGNOSIS — M25512 Pain in left shoulder: Secondary | ICD-10-CM | POA: Diagnosis not present

## 2017-12-27 DIAGNOSIS — M6281 Muscle weakness (generalized): Secondary | ICD-10-CM | POA: Diagnosis not present

## 2017-12-27 DIAGNOSIS — R2681 Unsteadiness on feet: Secondary | ICD-10-CM

## 2017-12-27 DIAGNOSIS — G8929 Other chronic pain: Secondary | ICD-10-CM

## 2017-12-27 DIAGNOSIS — R269 Unspecified abnormalities of gait and mobility: Secondary | ICD-10-CM

## 2017-12-27 NOTE — Therapy (Signed)
[place] MAIN [facility] SERVICES [address] [place], [name], [address] Phone: [phone]   Fax:  [phone]  Physical Therapy Treatment  Patient Details  Name: James RAATZ, James Bell MRN: [id number] Date of Birth: [birth year] Referring Provider: Charlett Blake    Encounter Date: 12/27/2017  PT End of Session - 12/27/17 0901    Visit Number  22    Number of Visits  25    Date for PT Re-Evaluation  02/14/18    PT Start Time  0850    PT Stop Time  1000    PT Time Calculation (min)  70 min    Equipment Utilized During Treatment  Gait belt    Activity Tolerance  Patient limited by fatigue;Patient tolerated treatment well    Behavior During Therapy  [facility] for tasks assessed/performed       Past Medical History:  Diagnosis Date  . Anemia   . Basal cell carcinoma 2018   R temple   . Basal cell carcinoma of nose 2002   bridge of nose  . BPH with obstruction/lower urinary tract symptoms    failed flomax, uroxatral, myrbetriq - sees urology [name]) pending videourodynamics  . CKD (chronic kidney disease) stage 3, GFR 30-59 ml/min (HCC) 03/11/2012   Renal US 02/2014 - multiple kidney cysts   . CVA (cerebral infarction) 03/07/12   slurred speech; right hemplegia, left handed - completed PT 07/2014 (17 sessions)  . Depression    mild, on cymbalta per prior PCP  . Erectile dysfunction    per urology (prostaglandin injection)  . GERD (gastroesophageal reflux disease)    with sliding HH  . H/O hiatal hernia   . History of kidney problems 12/2010   lacerated left kidney after fall  . HLD (hyperlipidemia)   . Kidney cysts 02/2014   bilateral (renal US)  . Migraines 03/11/12   now rare  . OSA on CPAP    sleep study 01/2014 - severe, CPAP at 10, previously on nuvigil (Dr. [name])  . Palpitations 01/2012   holter WNL, rare PAC/PVCs  . Presbycusis of both ears 2016   rec amplification [facility])  . Restless leg syndrome    well  controlled on mirapex  . Rosacea    Kowalski  . TIA (transient ischemic attack)   . Type 2 diabetes, controlled, with neuropathy [facility]) 2011   [name] in [facility]    Past Surgical History:  Procedure Laterality Date  . ABI  2007   WNL  . CARDIAC CATHETERIZATION  2004   normal; [place] [facility]  . CARDIOVASCULAR STRESS TEST  02/2014   WNL, low risk scan, EF 68%  . CAROTID ENDARTERECTOMY    . CATARACT EXTRACTION W/ INTRAOCULAR LENS IMPLANT  11//2012 - 08/2011  . COLONOSCOPY  03/2011   poor prep, unable to biopsy polyp in tic fold, rpt 3 mo [name])  . COLONOSCOPY  06/2011   mult polyps adenomatous, severe diverticulosis, rpt 3 yrs [name])  . CYSTOSCOPY WITH URETHRAL DILATATION N/A 07/24/2013   Procedure: CYSTOSCOPY WITH URETHRAL DILATATION;  Surgeon: [name], James Bell;  Location: [facility];  Service: Urology;  Laterality: N/A;  . ESOPHAGOGASTRODUODENOSCOPY  2006   duodenitis, medual HH, Grade 2 reflux, mild gastritis  . ESOPHAGOGASTRODUODENOSCOPY  07/2011   mild chronic gastritis  . [place]  . [place]; 2002   left, then repaired  . MRI brain  04/2010   WNL  . PROSTATE SURGERY  02/2009   PVP (laser)  . SKIN CANCER EXCISION  2002   bridge of nose, BCC  . TONSILLECTOMY AND ADENOIDECTOMY  ~ 1945  . TRANSURETHRAL INCISION OF PROSTATE N/A 07/24/2013   Procedure: TRANSURETHRAL INCISION OF THE PROSTATE (TUIP);  Surgeon: [name], James Bell;  Location: [facility];  Service: Urology;  Laterality: N/A;  . URETHRAL DILATION  2014   tannenbaum    There were no vitals filed for this visit.  Subjective Assessment - 12/27/17 0859    Subjective  Pt reports that he is doing well today. No specific questions or concerns. Reports  6/10 pain to left shoulder.    Pertinent History  Patinet had CVA March 11 2012. He had rehab with hospital and SNF and then outpatient therapy. He wears a lumbar corset to decrease his back pain if he is standing.     How long can you sit  comfortably?  unlimited    How long can you stand comfortably?  10 or 15 mins    How long can you walk comfortably?  15 mins using a rollator outdoors.    Patient Stated Goals  Patient wants to be able to stand for longer periods and walk for longer periods.    Currently in Pain?  Yes    Pain Score  6     Pain Location  Shoulder    Pain Orientation  Left    Pain Onset  1 to 4 weeks ago    Pain Frequency  Intermittent    Aggravating Factors   raising his arm above 90 degs    Pain Relieving Factors  rest    Effect of Pain on Daily Activities  not able to use his arm as much for activities    Multiple Pain Sites  No       Treatment: Manual therapy thoracic spine T1- T12 PA glides, grade 3 STM to L scapula musculature to decrease trigger areas  Neuromuscular training:   Resisted walking fwd, retro, bil side stepping with12.5# x 3 laps each; min A throughout due to pt being unsteady and losing balance multiple times throughout each direction; increased time to complete task  Alternate toe taps on 4 inch step without rail assist x15 with cues to step backwards for better foot placement and avoid toe catching; Standing one foot on airex, one foot on 4 inch step with BUE ball pass side/side x10 each foot on step with min A for safety and cues to improve trunk rotation for better balance challenge; Modified tandem stance with BUE ball up/down x10 each foot in front with CGA for safety;   Standing on airex beam:  Tandem stance on airex beam without rail assist 10 sec hold x4 each foot in front with CGA for safety and cues to improve upper trunk control for better balance control; Side stepping down airex beam without rail assist x3 laps each direction with cues to keep feet on beam and avoid stepping off; Standing with feet apart, BUE ball toss x10 unsupported with close supervision; Patient exhibits posterior loss of balance requiring cues for forward weight shift; Rocker board side to side ,  fwd/ bwd x 15 each way ; cues for posture and weight shifting.                     PT Education - 12/27/17 0900    Education provided  Yes    Education Details  safety with balance.    Person(s)  Educated  Patient    Methods  Explanation    Comprehension  Verbalized understanding       PT Short Term Goals - 10/24/17 0858      PT SHORT TERM GOAL #1   Title  Patient will ascend and descend step without railing and use of hemiwalker without loss of balance to be able to safely get into his house.    Baseline  Patient continues to use the railing with steps    Time  6    Period  Weeks    Status  On-going    Target Date  11/14/17        PT Long Term Goals - 12/20/17 1038      PT LONG TERM GOAL #1   Title  Patient will be able to stand for 1 hour without increase in pain to demonstrate improved tolerance for ADLs.     Baseline  15 mins:     Time  12    Period  Weeks    Status  On-going      PT LONG TERM GOAL #2   Title  Patient will reduce timed up and go to <11 seconds to reduce fall risk and demonstrate improved transfer/gait ability.    Baseline  26 sec, 10/24/17 18.83 sec;  11/22/17 14.86 sec, 12/20/17= 13.71    Time  12    Period  Weeks    Status  On-going      PT LONG TERM GOAL #3   Title  Patient (> 39 years old) will complete five times sit to stand test in < 15 seconds indicating an increased LE strength and improved balance.    Baseline  5x sit to stand in 25.73 seconds. , 22.63 sec 10/24/17;; 11/22/17 15.96, 4/3 19 15.45    Time  12    Period  Weeks    Status  On-going      PT LONG TERM GOAL #4   Title  Patient will increase six minute walk test distance to >600 for progression to community ambulator and improve gait ability    Baseline  474 ft, 710 feet;,790 feet, 775 feet    Time  12    Period  Weeks    Status  On-going            Plan - 12/27/17 1016    Clinical Impression Statement  Patient presents with decreased BLE strength, decreased  gait speed, LUE decreased AROM, decreased standing balance.  Patient's main complaint is pain and inability to participate in desired activities. Patient will benefit from skilled therapy in order to increase LE strength and standing balance and increase gait speed and improve her ability to participate in desired activities, and decrease her falls risk.     Rehab Potential  Good    PT Frequency  2x / week    PT Treatment/Interventions  Dry needling;Manual techniques;Balance training;Neuromuscular re-education;Patient/family education;Therapeutic exercise;Therapeutic activities;Functional mobility training;Stair training;Gait training;Moist Heat;Cryotherapy;Electrical Stimulation    PT Next Visit Plan  balance and strength training    Consulted and Agree with Plan of Care  Patient       Patient will benefit from skilled therapeutic intervention in order to improve the following deficits and impairments:  Abnormal gait, Decreased balance, Decreased endurance, Decreased mobility, Difficulty walking, Impaired sensation, Impaired tone, Decreased activity tolerance, Decreased strength, Decreased safety awareness, Pain  Visit Diagnosis: Muscle weakness  Unsteadiness  Abnormality of gait  Chronic left shoulder pain  Chronic midline low back  pain without sciatica     Problem List Patient Active Problem List   Diagnosis Date Noted  . Rosacea   . Gait disturbance, post-stroke 10/05/2015  . Presbycusis of both ears   . Advanced care planning/counseling discussion 06/23/2015  . Impaired mobility 04/23/2015  . HTN (hypertension) 03/18/2015  . Chronic diastolic heart failure, NYHA class 1 ([place]) 03/18/2015  . Spastic hemiparesis affecting dominant side ([place]) 07/07/2014  . Chest pressure 02/03/2014  . Medicare annual wellness visit, subsequent 11/06/2013  . Erectile dysfunction due to arterial insufficiency 02/05/2013  . MDD (major depressive disorder), recurrent episode, moderate ([place])   .  Palpitations 06/26/2012  . HLD (hyperlipidemia)   . Restless leg syndrome   . GERD (gastroesophageal reflux disease)   . BPH with obstruction/lower urinary tract symptoms   . History of CVA (cerebrovascular accident) 03/11/2012  . Hemiparesis affecting right side as late effect of stroke ([place]) 03/11/2012  . Type 2 diabetes, controlled, with neuropathy ([place]) 03/11/2012  . OSA on CPAP 03/11/2012  . CKD (chronic kidney disease) stage 3, GFR 30-59 ml/min (HCC) 03/11/2012  . Migraines 03/11/2012    Alanson Puls, PT DPT 12/27/2017, 10:17 AM  [place] MAIN [facility] SERVICES [place], [name], [address] Phone: [phone]   Fax:  [phone]  Name: James GRIEDER, James Bell MRN: [id number] Date of Birth: 12-Aug-1931

## 2018-01-01 ENCOUNTER — Ambulatory Visit: Payer: Medicare Other | Admitting: Physical Therapy

## 2018-01-02 ENCOUNTER — Encounter: Payer: Self-pay | Admitting: Physical Therapy

## 2018-01-02 ENCOUNTER — Ambulatory Visit: Payer: Medicare Other | Admitting: Physical Therapy

## 2018-01-02 DIAGNOSIS — M25512 Pain in left shoulder: Secondary | ICD-10-CM | POA: Diagnosis not present

## 2018-01-02 DIAGNOSIS — R2681 Unsteadiness on feet: Secondary | ICD-10-CM | POA: Diagnosis not present

## 2018-01-02 DIAGNOSIS — R269 Unspecified abnormalities of gait and mobility: Secondary | ICD-10-CM | POA: Diagnosis not present

## 2018-01-02 DIAGNOSIS — M6281 Muscle weakness (generalized): Secondary | ICD-10-CM | POA: Diagnosis not present

## 2018-01-02 DIAGNOSIS — G8929 Other chronic pain: Secondary | ICD-10-CM

## 2018-01-02 DIAGNOSIS — M545 Low back pain: Secondary | ICD-10-CM | POA: Diagnosis not present

## 2018-01-02 NOTE — Therapy (Signed)
[place] MAIN [facility] SERVICES [address] [place], [name], [address] Phone: [phone]   Fax:  [phone]  Physical Therapy Treatment  Patient Details  Name: James RICHES, James Bell MRN: [id number] Date of Birth: 1931-03-19 Referring Provider: Charlett Blake    Encounter Date: 01/02/2018  PT End of Session - 01/02/18 1354    Visit Number  23    Number of Visits  25    Date for PT Re-Evaluation  02/14/18    PT Start Time  0100    PT Stop Time  0145    PT Time Calculation (min)  45 min    Equipment Utilized During Treatment  Gait belt    Activity Tolerance  Patient limited by fatigue;Patient tolerated treatment well    Behavior During Therapy  [facility] for tasks assessed/performed       Past Medical History:  Diagnosis Date  . Anemia   . Basal cell carcinoma 2018   R temple   . Basal cell carcinoma of nose 2002   bridge of nose  . BPH with obstruction/lower urinary tract symptoms    failed flomax, uroxatral, myrbetriq - sees urology [name]) pending videourodynamics  . CKD (chronic kidney disease) stage 3, GFR 30-59 ml/min (HCC) 03/11/2012   Renal US 02/2014 - multiple kidney cysts   . CVA (cerebral infarction) 03/07/12   slurred speech; right hemplegia, left handed - completed PT 07/2014 (17 sessions)  . Depression    mild, on cymbalta per prior PCP  . Erectile dysfunction    per urology (prostaglandin injection)  . GERD (gastroesophageal reflux disease)    with sliding HH  . H/O hiatal hernia   . History of kidney problems 12/2010   lacerated left kidney after fall  . HLD (hyperlipidemia)   . Kidney cysts 02/2014   bilateral (renal US)  . Migraines 03/11/12   now rare  . OSA on CPAP    sleep study 01/2014 - severe, CPAP at 10, previously on nuvigil (Dr. [name])  . Palpitations 01/2012   holter WNL, rare PAC/PVCs  . Presbycusis of both ears 2016   rec amplification [facility])  . Restless leg syndrome    well  controlled on mirapex  . Rosacea    Kowalski  . TIA (transient ischemic attack)   . Type 2 diabetes, controlled, with neuropathy [facility]) 2011   [name] in [facility]    Past Surgical History:  Procedure Laterality Date  . ABI  2007   WNL  . CARDIAC CATHETERIZATION  2004   normal; [place]  . CARDIOVASCULAR STRESS TEST  02/2014   WNL, low risk scan, EF 68%  . CAROTID ENDARTERECTOMY    . CATARACT EXTRACTION W/ INTRAOCULAR LENS IMPLANT  11//2012 - 08/2011  . COLONOSCOPY  03/2011   poor prep, unable to biopsy polyp in tic fold, rpt 3 mo [name])  . COLONOSCOPY  06/2011   mult polyps adenomatous, severe diverticulosis, rpt 3 yrs [name])  . CYSTOSCOPY WITH URETHRAL DILATATION N/A 07/24/2013   Procedure: CYSTOSCOPY WITH URETHRAL DILATATION;  Surgeon: [name], James Bell;  [facility];  Service: Urology;  Laterality: N/A;  . ESOPHAGOGASTRODUODENOSCOPY  2006   duodenitis, medual HH, Grade 2 reflux, mild gastritis  . ESOPHAGOGASTRODUODENOSCOPY  07/2011   mild chronic gastritis  . [place]  . [place]; 2002   left, then repaired  . MRI brain  04/2010   WNL  . PROSTATE SURGERY  02/2009   PVP (laser)  . SKIN CANCER EXCISION  2002   bridge of nose, BCC  . TONSILLECTOMY AND ADENOIDECTOMY  ~ 1945  . TRANSURETHRAL INCISION OF PROSTATE N/A 07/24/2013   Procedure: TRANSURETHRAL INCISION OF THE PROSTATE (TUIP);  Surgeon: [name], James Bell;  [facility];  Service: Urology;  Laterality: N/A;  . URETHRAL DILATION  2014   tannenbaum    There were no vitals filed for this visit.  Subjective Assessment - 01/02/18 1353    Subjective  Pt reports that he is doing well today. No specific questions or concerns. Reports  2/10 pain to left shoulder.    Pertinent History  Patinet had CVA March 11 2012. He had rehab with hospital and SNF and then outpatient therapy. He wears a lumbar corset to decrease his back pain if he is standing.     How long can you sit  comfortably?  unlimited    How long can you stand comfortably?  10 or 15 mins    How long can you walk comfortably?  15 mins using a rollator outdoors.    Patient Stated Goals  Patient wants to be able to stand for longer periods and walk for longer periods.    Currently in Pain?  Yes    Pain Score  2     Pain Location  Shoulder    Pain Orientation  Left    Pain Descriptors / Indicators  Aching    Pain Type  Acute pain    Pain Onset  1 to 4 weeks ago    Pain Frequency  Intermittent    Aggravating Factors   raising his arm above 90 deg    Pain Relieving Factors  rest       Standing on airex beam:  Tandem stance on airex beam without rail assist 10 sec hold x4 each foot in front with CGA for safety and cues to improve upper trunk control for better balance control; Side stepping down airex beam without rail assist x3 laps each direction with cues to keep feet on beam and avoid stepping off; Standing with feet apart, BUE ball toss x10 unsupportedwith close supervision; Patient exhibits posterior loss of balance requiring cues for forward weight shift; Rocker board side to side , fwd/ bwd x 15 each way ; cues for posture and weight shifting.  Manual therapy : AP glides grade 3 to T 4- T12 x 30 bouts x 2 STM to left shoulder and scapula musculature to decrease muscle spasms and decrease pain  Patient has no pain following manual therapy                      PT Education - 01/02/18 1353    Education provided  Yes    Education Details  safety with balance    Person(s) Educated  Patient    Methods  Explanation    Comprehension  Returned demonstration;Verbalized understanding       PT Short Term Goals - 10/24/17 0858      PT SHORT TERM GOAL #1   Title  Patient will ascend and descend step without railing and use of hemiwalker without loss of balance to be able to safely get into his house.    Baseline  Patient continues to use the railing with steps    Time  6     Period  Weeks    Status  On-going    Target Date  11/14/17  PT Long Term Goals - 12/20/17 1038      PT LONG TERM GOAL #1   Title  Patient will be able to stand for 1 hour without increase in pain to demonstrate improved tolerance for ADLs.     Baseline  15 mins:     Time  12    Period  Weeks    Status  On-going      PT LONG TERM GOAL #2   Title  Patient will reduce timed up and go to <11 seconds to reduce fall risk and demonstrate improved transfer/gait ability.    Baseline  26 sec, 10/24/17 18.83 sec;  11/22/17 14.86 sec, 12/20/17= 13.71    Time  12    Period  Weeks    Status  On-going      PT LONG TERM GOAL #3   Title  Patient (> 38 years old) will complete five times sit to stand test in < 15 seconds indicating an increased LE strength and improved balance.    Baseline  5x sit to stand in 25.73 seconds. , 22.63 sec 10/24/17;; 11/22/17 15.96, 4/3 19 15.45    Time  12    Period  Weeks    Status  On-going      PT LONG TERM GOAL #4   Title  Patient will increase six minute walk test distance to >600 for progression to community ambulator and improve gait ability    Baseline  474 ft, 710 feet;,790 feet, 775 feet    Time  12    Period  Weeks    Status  On-going            Plan - 01/02/18 1354    Clinical Impression Statement  Pt presents with unsteadiness on uneven surfaces and fatigues with therapeutic exercises. Patient needs assist with single leg balance activities and needs CGA assist with longer single leg timed standing activities. Patient demonstrates difficulty with dynamic standing balance and increased postural sway while on 1/2 foam , and rocker board with decreased base of support and increased challenges for UE.  Patient tolerated all interventions well this date and will benefit from continued skilled PT interventions to improve strength and balance and decrease risk of falling    Rehab Potential  Good    PT Frequency  2x / week    PT Treatment/Interventions   Dry needling;Manual techniques;Balance training;Neuromuscular re-education;Patient/family education;Therapeutic exercise;Therapeutic activities;Functional mobility training;Stair training;Gait training;Moist Heat;Cryotherapy;Electrical Stimulation    PT Next Visit Plan  balance and strength training    Consulted and Agree with Plan of Care  Patient       Patient will benefit from skilled therapeutic intervention in order to improve the following deficits and impairments:  Abnormal gait, Decreased balance, Decreased endurance, Decreased mobility, Difficulty walking, Impaired sensation, Impaired tone, Decreased activity tolerance, Decreased strength, Decreased safety awareness, Pain  Visit Diagnosis: Muscle weakness  Unsteadiness  Abnormality of gait  Chronic left shoulder pain     Problem List Patient Active Problem List   Diagnosis Date Noted  . Rosacea   . Gait disturbance, post-stroke 10/05/2015  . Presbycusis of both ears   . Advanced care planning/counseling discussion 06/23/2015  . Impaired mobility 04/23/2015  . HTN (hypertension) 03/18/2015  . Chronic diastolic heart failure, NYHA class 1 ([place]) 03/18/2015  . Spastic hemiparesis affecting dominant side ([place]) 07/07/2014  . Chest pressure 02/03/2014  . Medicare annual wellness visit, subsequent 11/06/2013  . Erectile dysfunction due to arterial insufficiency 02/05/2013  . MDD (  major depressive disorder), recurrent episode, moderate ([place])   . Palpitations 06/26/2012  . HLD (hyperlipidemia)   . Restless leg syndrome   . GERD (gastroesophageal reflux disease)   . BPH with obstruction/lower urinary tract symptoms   . History of CVA (cerebrovascular accident) 03/11/2012  . Hemiparesis affecting right side as late effect of stroke ([place]) 03/11/2012  . Type 2 diabetes, controlled, with neuropathy ([place]) 03/11/2012  . OSA on CPAP 03/11/2012  . CKD (chronic kidney disease) stage 3, GFR 30-59 ml/min (HCC) 03/11/2012  . Migraines  03/11/2012    Alanson Puls, PT DPT 01/02/2018, 1:56 PM  [place] MAIN [facility] SERVICES [place], [name], [address] Phone: [phone]   Fax:  [phone]  Name: James BELISLE, James Bell MRN: [id number] Date of Birth: 10-Feb-1931

## 2018-01-07 ENCOUNTER — Ambulatory Visit: Payer: Medicare Other | Admitting: Physical Therapy

## 2018-01-07 ENCOUNTER — Encounter: Payer: Self-pay | Admitting: Physical Therapy

## 2018-01-07 ENCOUNTER — Other Ambulatory Visit: Payer: Self-pay | Admitting: Ophthalmology

## 2018-01-07 DIAGNOSIS — H532 Diplopia: Secondary | ICD-10-CM

## 2018-01-07 DIAGNOSIS — M545 Low back pain: Secondary | ICD-10-CM | POA: Diagnosis not present

## 2018-01-07 DIAGNOSIS — M25512 Pain in left shoulder: Secondary | ICD-10-CM

## 2018-01-07 DIAGNOSIS — R2681 Unsteadiness on feet: Secondary | ICD-10-CM | POA: Diagnosis not present

## 2018-01-07 DIAGNOSIS — M6281 Muscle weakness (generalized): Secondary | ICD-10-CM

## 2018-01-07 DIAGNOSIS — R269 Unspecified abnormalities of gait and mobility: Secondary | ICD-10-CM

## 2018-01-07 DIAGNOSIS — G8929 Other chronic pain: Secondary | ICD-10-CM

## 2018-01-07 LAB — HM DIABETES EYE EXAM

## 2018-01-07 NOTE — Therapy (Signed)
[place] MAIN [facility] SERVICES [address] [place], [name], [address] Phone: [phone]   Fax:  [phone]  Physical Therapy Treatment  Patient Details  Name: VERTIS SCHEIB, MD MRN: [id number] Date of Birth: 1931/07/15 Referring Provider: Charlett Blake    Encounter Date: 01/07/2018  PT End of Session - 01/07/18 1131    Visit Number  24    Number of Visits  41    Date for PT Re-Evaluation  02/14/18    PT Start Time  4970    PT Stop Time  1230    PT Time Calculation (min)  45 min    Equipment Utilized During Treatment  Gait belt    Activity Tolerance  Patient limited by fatigue;Patient tolerated treatment well    Behavior During Therapy  [facility] for tasks assessed/performed       Past Medical History:  Diagnosis Date  . Anemia   . Basal cell carcinoma 2018   R temple   . Basal cell carcinoma of nose 2002   bridge of nose  . BPH with obstruction/lower urinary tract symptoms    failed flomax, uroxatral, myrbetriq - sees urology [name]) pending videourodynamics  . CKD (chronic kidney disease) stage 3, GFR 30-59 ml/min (HCC) 03/11/2012   Renal US 02/2014 - multiple kidney cysts   . CVA (cerebral infarction) 03/07/12   slurred speech; right hemplegia, left handed - completed PT 07/2014 (17 sessions)  . Depression    mild, on cymbalta per prior PCP  . Erectile dysfunction    per urology (prostaglandin injection)  . GERD (gastroesophageal reflux disease)    with sliding HH  . H/O hiatal hernia   . History of kidney problems 12/2010   lacerated left kidney after fall  . HLD (hyperlipidemia)   . Kidney cysts 02/2014   bilateral (renal US)  . Migraines 03/11/12   now rare  . OSA on CPAP    sleep study 01/2014 - severe, CPAP at 10, previously on nuvigil (Dr. [name])  . Palpitations 01/2012   holter WNL, rare PAC/PVCs  . Presbycusis of both ears 2016   rec amplification [facility])  . Restless leg syndrome    well  controlled on mirapex  . Rosacea    Kowalski  . TIA (transient ischemic attack)   . Type 2 diabetes, controlled, with neuropathy [facility]) 2011   [name] in [facility]    Past Surgical History:  Procedure Laterality Date  . ABI  2007   WNL  . CARDIAC CATHETERIZATION  2004   normal; [place]  . CARDIOVASCULAR STRESS TEST  02/2014   WNL, low risk scan, EF 68%  . CAROTID ENDARTERECTOMY    . CATARACT EXTRACTION W/ INTRAOCULAR LENS IMPLANT  11//2012 - 08/2011  . COLONOSCOPY  03/2011   poor prep, unable to biopsy polyp in tic fold, rpt 3 mo [name])  . COLONOSCOPY  06/2011   mult polyps adenomatous, severe diverticulosis, rpt 3 yrs [name])  . CYSTOSCOPY WITH URETHRAL DILATATION N/A 07/24/2013   Procedure: CYSTOSCOPY WITH URETHRAL DILATATION;  Surgeon: [name], MD;  Location: [facility];  Service: Urology;  Laterality: N/A;  . ESOPHAGOGASTRODUODENOSCOPY  2006   duodenitis, medual HH, Grade 2 reflux, mild gastritis  . ESOPHAGOGASTRODUODENOSCOPY  07/2011   mild chronic gastritis  . [place]  . [place]; 2002   left, then repaired  . MRI brain  04/2010   WNL  . PROSTATE SURGERY  02/2009   PVP (laser)  . SKIN CANCER EXCISION  2002   bridge of nose, BCC  . TONSILLECTOMY AND ADENOIDECTOMY  ~ 1945  . TRANSURETHRAL INCISION OF PROSTATE N/A 07/24/2013   Procedure: TRANSURETHRAL INCISION OF THE PROSTATE (TUIP);  Surgeon: [name], MD;  Location: [facility];  Service: Urology;  Laterality: N/A;  . URETHRAL DILATION  2014   tannenbaum    There were no vitals filed for this visit.  Treatrment: Nu- step x 5 mins for warm up hooklying with TA contraction and hip ER / abd with GTB x 20 Hooklying with TA and marching x 20  hooklying and trunk rotation x 20 Standing back extension x 10 using a stable surface under hips Educated in posture for standing and sitting for correct support Patient has decreased pain following therapy today 3/10 to low  back.                          PT Education - 01/07/18 1152    Education provided  Yes    Education Details  HEP    Person(s) Educated  Patient    Methods  Explanation;Demonstration    Comprehension  Verbalized understanding;Returned demonstration       PT Short Term Goals - 10/24/17 0858      PT SHORT TERM GOAL #1   Title  Patient will ascend and descend step without railing and use of hemiwalker without loss of balance to be able to safely get into his house.    Baseline  Patient continues to use the railing with steps    Time  6    Period  Weeks    Status  On-going    Target Date  11/14/17        PT Long Term Goals - 12/20/17 1038      PT LONG TERM GOAL #1   Title  Patient will be able to stand for 1 hour without increase in pain to demonstrate improved tolerance for ADLs.     Baseline  15 mins:     Time  12    Period  Weeks    Status  On-going      PT LONG TERM GOAL #2   Title  Patient will reduce timed up and go to <11 seconds to reduce fall risk and demonstrate improved transfer/gait ability.    Baseline  26 sec, 10/24/17 18.83 sec;  11/22/17 14.86 sec, 12/20/17= 13.71    Time  12    Period  Weeks    Status  On-going      PT LONG TERM GOAL #3   Title  Patient (> 74 years old) will complete five times sit to stand test in < 15 seconds indicating an increased LE strength and improved balance.    Baseline  5x sit to stand in 25.73 seconds. , 22.63 sec 10/24/17;; 11/22/17 15.96, 4/3 19 15.45    Time  12    Period  Weeks    Status  On-going      PT LONG TERM GOAL #4   Title  Patient will increase six minute walk test distance to >600 for progression to community ambulator and improve gait ability    Baseline  474 ft, 710 feet;,790 feet, 775 feet    Time  12    Period  Weeks    Status  On-going            Plan - 01/07/18  1132    Clinical Impression Statement  Patient performs seated LE strengthening today due to back pain with standing. He  has chronic back pain that occassionally flairs up. He will continue to benefit from skilled PT to improve mobility and safety.     Rehab Potential  Good    PT Frequency  2x / week    PT Treatment/Interventions  Dry needling;Manual techniques;Balance training;Neuromuscular re-education;Patient/family education;Therapeutic exercise;Therapeutic activities;Functional mobility training;Stair training;Gait training;Moist Heat;Cryotherapy;Electrical Stimulation    PT Next Visit Plan  balance and strength training    Consulted and Agree with Plan of Care  Patient       Patient will benefit from skilled therapeutic intervention in order to improve the following deficits and impairments:  Abnormal gait, Decreased balance, Decreased endurance, Decreased mobility, Difficulty walking, Impaired sensation, Impaired tone, Decreased activity tolerance, Decreased strength, Decreased safety awareness, Pain  Visit Diagnosis: Muscle weakness  Unsteadiness  Abnormality of gait  Chronic left shoulder pain     Problem List Patient Active Problem List   Diagnosis Date Noted  . Rosacea   . Gait disturbance, post-stroke 10/05/2015  . Presbycusis of both ears   . Advanced care planning/counseling discussion 06/23/2015  . Impaired mobility 04/23/2015  . HTN (hypertension) 03/18/2015  . Chronic diastolic heart failure, NYHA class 1 ([place]) 03/18/2015  . Spastic hemiparesis affecting dominant side ([place]) 07/07/2014  . Chest pressure 02/03/2014  . Medicare annual wellness visit, subsequent 11/06/2013  . Erectile dysfunction due to arterial insufficiency 02/05/2013  . MDD (major depressive disorder), recurrent episode, moderate ([place])   . Palpitations 06/26/2012  . HLD (hyperlipidemia)   . Restless leg syndrome   . GERD (gastroesophageal reflux disease)   . BPH with obstruction/lower urinary tract symptoms   . History of CVA (cerebrovascular accident) 03/11/2012  . Hemiparesis affecting right side as late  effect of stroke ([place]) 03/11/2012  . Type 2 diabetes, controlled, with neuropathy ([place]) 03/11/2012  . OSA on CPAP 03/11/2012  . CKD (chronic kidney disease) stage 3, GFR 30-59 ml/min (HCC) 03/11/2012  . Migraines 03/11/2012    Alanson Puls, PT DPT 01/07/2018, 11:59 AM  [place] MAIN [facility] SERVICES [address] [place], [name], [address] Phone: [phone]   Fax:  [phone]  Name: AUDRICK LAMOUREAUX, MD MRN: [id number] Date of Birth: [birth year]

## 2018-01-09 ENCOUNTER — Encounter: Payer: Self-pay | Admitting: Family Medicine

## 2018-01-09 ENCOUNTER — Ambulatory Visit: Payer: Medicare Other | Admitting: Physical Therapy

## 2018-01-09 ENCOUNTER — Encounter: Payer: Self-pay | Admitting: Physical Therapy

## 2018-01-09 DIAGNOSIS — R2681 Unsteadiness on feet: Secondary | ICD-10-CM | POA: Diagnosis not present

## 2018-01-09 DIAGNOSIS — M6281 Muscle weakness (generalized): Secondary | ICD-10-CM | POA: Diagnosis not present

## 2018-01-09 DIAGNOSIS — G8929 Other chronic pain: Secondary | ICD-10-CM

## 2018-01-09 DIAGNOSIS — M545 Low back pain, unspecified: Secondary | ICD-10-CM

## 2018-01-09 DIAGNOSIS — R269 Unspecified abnormalities of gait and mobility: Secondary | ICD-10-CM | POA: Diagnosis not present

## 2018-01-09 DIAGNOSIS — M25512 Pain in left shoulder: Secondary | ICD-10-CM | POA: Diagnosis not present

## 2018-01-09 NOTE — Therapy (Signed)
[place] MAIN [facility] SERVICES [address] [place], [name], [address] Phone: [phone]   Fax:  [phone]  Physical Therapy Treatment  Patient Details  Name: James HOHENSEE, James Bell MRN: [id number] Date of Birth: [birth year] Referring Provider: Charlett Blake    Encounter Date: 01/09/2018  PT End of Session - 01/09/18 1143    Visit Number  25    Number of Visits  41    Date for PT Re-Evaluation  02/14/18    PT Start Time  3790    PT Stop Time  1230    PT Time Calculation (min)  45 min    Equipment Utilized During Treatment  Gait belt    Activity Tolerance  Patient limited by fatigue;Patient tolerated treatment well    Behavior During Therapy  [facility] for tasks assessed/performed       Past Medical History:  Diagnosis Date  . Anemia   . Basal cell carcinoma 2018   R temple   . Basal cell carcinoma of nose 2002   bridge of nose  . BPH with obstruction/lower urinary tract symptoms    failed flomax, uroxatral, myrbetriq - sees urology [name]) pending videourodynamics  . CKD (chronic kidney disease) stage 3, GFR 30-59 ml/min (HCC) 03/11/2012   Renal US 02/2014 - multiple kidney cysts   . CVA (cerebral infarction) 03/07/12   slurred speech; right hemplegia, left handed - completed PT 07/2014 (17 sessions)  . Depression    mild, on cymbalta per prior PCP  . Erectile dysfunction    per urology (prostaglandin injection)  . GERD (gastroesophageal reflux disease)    with sliding HH  . H/O hiatal hernia   . History of kidney problems 12/2010   lacerated left kidney after fall  . HLD (hyperlipidemia)   . Kidney cysts 02/2014   bilateral (renal US)  . Migraines 03/11/12   now rare  . OSA on CPAP    sleep study 01/2014 - severe, CPAP at 10, previously on nuvigil (Dr. [name])  . Palpitations 01/2012   holter WNL, rare PAC/PVCs  . Presbycusis of both ears 2016   rec amplification [facility])  . Restless leg syndrome    well  controlled on mirapex  . Rosacea    Kowalski  . TIA (transient ischemic attack)   . Type 2 diabetes, controlled, with neuropathy [facility]) 2011   [name] in [facility]    Past Surgical History:  Procedure Laterality Date  . ABI  2007   WNL  . CARDIAC CATHETERIZATION  2004   normal; [place]  . CARDIOVASCULAR STRESS TEST  02/2014   WNL, low risk scan, EF 68%  . CAROTID ENDARTERECTOMY    . CATARACT EXTRACTION W/ INTRAOCULAR LENS IMPLANT  11//2012 - 08/2011  . COLONOSCOPY  03/2011   poor prep, unable to biopsy polyp in tic fold, rpt 3 mo [name])  . COLONOSCOPY  06/2011   mult polyps adenomatous, severe diverticulosis, rpt 3 yrs [name])  . CYSTOSCOPY WITH URETHRAL DILATATION N/A 07/24/2013   Procedure: CYSTOSCOPY WITH URETHRAL DILATATION;  Surgeon: [name], James Bell;  [facility];  Service: Urology;  Laterality: N/A;  . ESOPHAGOGASTRODUODENOSCOPY  2006   duodenitis, medual HH, Grade 2 reflux, mild gastritis  . ESOPHAGOGASTRODUODENOSCOPY  07/2011   mild chronic gastritis  . [place]  . [place]; 2002   left, then repaired  . MRI brain  04/2010   WNL  . PROSTATE SURGERY  02/2009   PVP (laser)  . SKIN CANCER EXCISION  2002   bridge of nose, BCC  . TONSILLECTOMY AND ADENOIDECTOMY  ~ 1945  . TRANSURETHRAL INCISION OF PROSTATE N/A 07/24/2013   Procedure: TRANSURETHRAL INCISION OF THE PROSTATE (TUIP);  Surgeon: [name], James Bell;  [facility];  Service: Urology;  Laterality: N/A;  . URETHRAL DILATION  2014   tannenbaum    There were no vitals filed for this visit.  Subjective Assessment - 01/09/18 1307    Subjective  Pt reports that his back pain is better today .     Pertinent History  P[place]et had CVA March 11 2012. He had rehab with hospital and SNF and then outpatient therapy. He wears a lumbar corset to decrease his back pain if he is standing.     How long can you sit comfortably?  unlimited    How long can you stand comfortably?   10 or 15 mins    How long can you walk comfortably?  15 mins using a rollator outdoors.    Patient Stated Goals  Patient wants to be able to stand for longer periods and walk for longer periods.    Currently in Pain?  No/denies    Pain Score  0-No pain    Pain Onset  1 to 4 weeks ago       Treatment hooklying TA contraction with marching, heel slides, hip abd/Er with GTB x 20 BLE hooklying and trunk rotation stretch x 30 sec left and right x 3 Review of body mechanics and safety techniques for mobility  Foam standing and toe taps x 20 alternating LE's Foam standing and step ups to 6 inch stool x 20 Patient has good understanding of HEP with review, of repetitions and frequency                        PT Education - 01/09/18 1143    Education provided  Yes    Education Details  exercise techniques    Person(s) Educated  Patient    Methods  Explanation;Demonstration    Comprehension  Verbalized understanding;Returned demonstration       PT Short Term Goals - 10/24/17 0858      PT SHORT TERM GOAL #1   Title  Patient will ascend and descend step without railing and use of hemiwalker without loss of balance to be able to safely get into his house.    Baseline  Patient continues to use the railing with steps    Time  6    Period  Weeks    Status  On-going    Target Date  11/14/17        PT Long Term Goals - 12/20/17 1038      PT LONG TERM GOAL #1   Title  Patient will be able to stand for 1 hour without increase in pain to demonstrate improved tolerance for ADLs.     Baseline  15 mins:     Time  12    Period  Weeks    Status  On-going      PT LONG TERM GOAL #2   Title  Patient will reduce timed up and go to <11 seconds to reduce fall risk and demonstrate improved transfer/gait ability.    Baseline  26 sec, 10/24/17 18.83 sec;  11/22/17 14.86 sec, 12/20/17= 13.71    Time  12    Period  Weeks    Status  On-going      PT LONG TERM GOAL #3   Title   Patient (> 53 years old) will complete five times sit to stand test in < 15 seconds indicating an increased LE strength and improved balance.    Baseline  5x sit to stand in 25.73 seconds. , 22.63 sec 10/24/17;; 11/22/17 15.96, 4/3 19 15.45    Time  12    Period  Weeks    Status  On-going      PT LONG TERM GOAL #4   Title  Patient will increase six minute walk test distance to >600 for progression to community ambulator and improve gait ability    Baseline  474 ft, 710 feet;,790 feet, 775 feet    Time  12    Period  Weeks    Status  On-going            Plan - 01/09/18 1307    Clinical Impression Statement  Patient performs review of HEP with hand outs and then 10 mins of neuromuscular training in standing to improve dynamic standing balance. Patient will continue to benefit from skilled PT to improve balance and mobility.    Rehab Potential  Good    PT Frequency  2x / week    PT Treatment/Interventions  Dry needling;Manual techniques;Balance training;Neuromuscular re-education;Patient/family education;Therapeutic exercise;Therapeutic activities;Functional mobility training;Stair training;Gait training;Moist Heat;Cryotherapy;Electrical Stimulation    PT Next Visit Plan  balance and strength training    Consulted and Agree with Plan of Care  Patient       Patient will benefit from skilled therapeutic intervention in order to improve the following deficits and impairments:  Abnormal gait, Decreased balance, Decreased endurance, Decreased mobility, Difficulty walking, Impaired sensation, Impaired tone, Decreased activity tolerance, Decreased strength, Decreased safety awareness, Pain  Visit Diagnosis: Muscle weakness  Unsteadiness  Abnormality of gait  Chronic left shoulder pain  Chronic midline low back pain without sciatica     Problem List Patient Active Problem List   Diagnosis Date Noted  . Rosacea   . Gait disturbance, post-stroke 10/05/2015  . Presbycusis of both  ears   . Advanced care planning/counseling discussion 06/23/2015  . Impaired mobility 04/23/2015  . HTN (hypertension) 03/18/2015  . Chronic diastolic heart failure, NYHA class 1 ([place]) 03/18/2015  . Spastic hemiparesis affecting dominant side ([place]) 07/07/2014  . Chest pressure 02/03/2014  . Medicare annual wellness visit, subsequent 11/06/2013  . Erectile dysfunction due to arterial insufficiency 02/05/2013  . MDD (major depressive disorder), recurrent episode, moderate ([place])   . Palpitations 06/26/2012  . HLD (hyperlipidemia)   . Restless leg syndrome   . GERD (gastroesophageal reflux disease)   . BPH with obstruction/lower urinary tract symptoms   . History of CVA (cerebrovascular accident) 03/11/2012  . Hemiparesis affecting right side as late effect of stroke ([place]) 03/11/2012  . Type 2 diabetes, controlled, with neuropathy ([place]) 03/11/2012  . OSA on CPAP 03/11/2012  . CKD (chronic kidney disease) stage 3, GFR 30-59 ml/min (HCC) 03/11/2012  . Migraines 03/11/2012    Alanson Puls, PT DPT 01/09/2018, 1:09 PM  [place] MAIN [facility] SERVICES [place], [name], [address] Phone: [phone]   Fax:  [phone]  Name: James ESGUERRA, James Bell MRN: [id number] Date of Birth: 06/14/1931

## 2018-01-09 NOTE — Patient Instructions (Signed)
Pelvic Tilt    Flatten back by tightening stomach muscles and buttocks. March legs x 40 repetitions Repeat __1__ times per set. Do _1___ sets per session. Do _7___ sessions per day.  http://orth.exer.us/134   Copyright  VHI. All rights reserved.  Pelvic Tilt    Flatten back by tightening stomach muscles and buttocks. Straighten out one leg x 15 repetitions  Repeat  Each leg 15____ times per set. Do 1 Pelvic Tilt: Posterior - Legs Bent (Supine)     Keep knees together and rotate to left side and hold x 30 seconds . Then rotate towards right and hold 30 seconds . Relax. Repeat _3___ times each side .  Do _1___ sessions per day.       Pelvic Tilt: Anterior - Legs Bent (Supine)    Rotate pelvis up and arch back. Hold  While you pull your knees apart with theraband tied around your knees. Relax. Repeat __40__ times per set. Do __1__ sets per session. Do ___1_ sessions per day.  http://orth.exer.us/212   Copyright  VHI. All rights reserved.    Copyright  VHI. All rights reserved.  PELVIC TILT: Anterior    Start in slumped position. Roll pelvis forward to arch back. ___ reps per set, ___ sets per day, ___ days per week   Copyright  VHI. All rights reserved.

## 2018-01-14 ENCOUNTER — Encounter: Payer: Self-pay | Admitting: Physical Therapy

## 2018-01-14 ENCOUNTER — Ambulatory Visit
Admission: RE | Admit: 2018-01-14 | Discharge: 2018-01-14 | Disposition: A | Discharge status: Home / Self Care | Payer: Medicare Other | Source: Ambulatory Visit | Attending: Ophthalmology | Admitting: Ophthalmology

## 2018-01-14 ENCOUNTER — Ambulatory Visit: Payer: Medicare Other | Admitting: Physical Therapy

## 2018-01-14 DIAGNOSIS — H532 Diplopia: Secondary | ICD-10-CM | POA: Diagnosis not present

## 2018-01-14 DIAGNOSIS — R2681 Unsteadiness on feet: Secondary | ICD-10-CM

## 2018-01-14 DIAGNOSIS — M545 Low back pain, unspecified: Secondary | ICD-10-CM

## 2018-01-14 DIAGNOSIS — M6281 Muscle weakness (generalized): Secondary | ICD-10-CM | POA: Diagnosis not present

## 2018-01-14 DIAGNOSIS — R269 Unspecified abnormalities of gait and mobility: Secondary | ICD-10-CM

## 2018-01-14 DIAGNOSIS — G8929 Other chronic pain: Secondary | ICD-10-CM

## 2018-01-14 DIAGNOSIS — M25512 Pain in left shoulder: Secondary | ICD-10-CM

## 2018-01-14 MED ORDER — GADOBENATE DIMEGLUMINE 529 MG/ML IV SOLN
17.0000 mL | Freq: Once | INTRAVENOUS | Status: AC | PRN
  Administered 2018-01-14: 17 mL via INTRAVENOUS

## 2018-01-14 NOTE — Therapy (Signed)
[place] MAIN [facility] SERVICES [address] [place], [name], [address] Phone: [phone]   Fax:  [phone]  Physical Therapy Treatment  Patient Details  Name: James FUKUDA, James Bell MRN: [id number] Date of Birth: September 10, 1931 Referring Provider: Charlett Blake    Encounter Date: 01/14/2018  PT End of Session - 01/14/18 1056    Visit Number  26    Number of Visits  41    Date for PT Re-Evaluation  02/14/18    PT Start Time  1100    PT Stop Time  1140    PT Time Calculation (min)  40 min    Equipment Utilized During Treatment  Gait belt    Activity Tolerance  Patient limited by fatigue;Patient tolerated treatment well    Behavior During Therapy  [facility] for tasks assessed/performed       Past Medical History:  Diagnosis Date  . Anemia   . Basal cell carcinoma 2018   R temple   . Basal cell carcinoma of nose 2002   bridge of nose  . BPH with obstruction/lower urinary tract symptoms    failed flomax, uroxatral, myrbetriq - sees urology [name]) pending videourodynamics  . CKD (chronic kidney disease) stage 3, GFR 30-59 ml/min (HCC) 03/11/2012   Renal US 02/2014 - multiple kidney cysts   . CVA (cerebral infarction) 03/07/12   slurred speech; right hemplegia, left handed - completed PT 07/2014 (17 sessions)  . Depression    mild, on cymbalta per prior PCP  . Erectile dysfunction    per urology (prostaglandin injection)  . GERD (gastroesophageal reflux disease)    with sliding HH  . H/O hiatal hernia   . History of kidney problems 12/2010   lacerated left kidney after fall  . HLD (hyperlipidemia)   . Kidney cysts 02/2014   bilateral (renal US)  . Migraines 03/11/12   now rare  . OSA on CPAP    sleep study 01/2014 - severe, CPAP at 10, previously on nuvigil (Dr. [name])  . Palpitations 01/2012   holter WNL, rare PAC/PVCs  . Presbycusis of both ears 2016   rec amplification [facility])  . Restless leg syndrome    well  controlled on mirapex  . Rosacea    [place]  . TIA (transient ischemic attack)   . Type 2 diabetes, controlled, with neuropathy [facility]) 2011   [name] in [facility]    Past Surgical History:  Procedure Laterality Date  . ABI  2007   WNL  . CARDIAC CATHETERIZATION  2004   normal; [place]  . CARDIOVASCULAR STRESS TEST  02/2014   WNL, low risk scan, EF 68%  . CAROTID ENDARTERECTOMY    . CATARACT EXTRACTION W/ INTRAOCULAR LENS IMPLANT  11//2012 - 08/2011  . COLONOSCOPY  03/2011   poor prep, unable to biopsy polyp in tic fold, rpt 3 mo [name])  . COLONOSCOPY  06/2011   mult polyps adenomatous, severe diverticulosis, rpt 3 yrs [name])  . CYSTOSCOPY WITH URETHRAL DILATATION N/A 07/24/2013   Procedure: CYSTOSCOPY WITH URETHRAL DILATATION;  Surgeon: [name], James Bell;  Location: [facility]S;  Service: Urology;  Laterality: N/A;  . ESOPHAGOGASTRODUODENOSCOPY  2006   duodenitis, medual HH, Grade 2 reflux, mild gastritis  . ESOPHAGOGASTRODUODENOSCOPY  07/2011   mild chronic gastritis  . [place]  . [place]; 2002   left, then repaired  . MRI brain  04/2010   WNL  . PROSTATE SURGERY  02/2009   PVP (laser)  . SKIN CANCER EXCISION  2002   bridge of nose, B[facility]  . TONSILLECTOMY AND ADENOIDECTOMY  ~ 1945  . TRANSURETHRAL INCISION OF PROSTATE N/A 07/24/2013   Procedure: TRANSURETHRAL INCISION OF THE PROSTATE (TUIP);  Surgeon: [name], James Bell;  Location: [facility];  Service: Urology;  Laterality: N/A;  . URETHRAL DILATION  2014   [place]    There were no vitals filed for this visit.  Subjective Assessment - 01/14/18 1054    Subjective  Pt reports that his back pain is better today .     Pertinent History  Patinet had CVA March 11 2012. He had rehab with hospital and SNF and then outpatient therapy. He wears a lumbar corset to decrease his back pain if he is standing.     How long can you sit comfortably?  unlimited    How long can you stand comfortably?   10 or 15 mins    How long can you walk comfortably?  15 mins using a rollator outdoors.    Patient Stated Goals  Patient wants to be able to stand for longer periods and walk for longer periods.    Pain Onset  1 to 4 weeks ago       NEUROMUSCULAR RE-EDUCATION  standing and stepping over 1/2 foam   standing hip abd with YTB x 20   side stepping left and right in parallel bars 10 feet x 3 step ups from floor to 6 inch stool x 20 bilateral marching in parallel bars x 20 PROM to left shoulder , AAROM to left shoulder Matrix fwd/bwd x 3 , 22.5 lbs  Patient needs occasional verbal cueing to improve posture and cueing to correctly perform exercises slowly, holding at end of range to increase motor firing of desired muscle to encourage fatigue.BLE staggered stance anterior/posterior weight shifting on small rockerboard;                            PT Education - 01/14/18 1053    Education provided  Yes    Education Details  safety    Person(s) Educated  Patient    Methods  Explanation;Demonstration    Comprehension  Verbalized understanding;Returned demonstration       PT Short Term Goals - 10/24/17 0858      PT SHORT TERM GOAL #1   Title  Patient will ascend and descend step without railing and use of hemiwalker without loss of balance to be able to safely get into his house.    Baseline  Patient continues to use the railing with steps    Time  6    Period  Weeks    Status  On-going    Target Date  11/14/17        PT Long Term Goals - 12/20/17 1038      PT LONG TERM GOAL #1   Title  Patient will be able to stand for 1 hour without increase in pain to demonstrate improved tolerance for ADLs.     Baseline  15 mins:     Time  12    Period  Weeks    Status  On-going      PT LONG TERM GOAL #2   Title  Patient will reduce timed up and go to <11 seconds to reduce fall risk and demonstrate improved transfer/gait ability.    Baseline  26 sec, 10/24/17 18.83  sec;  11/22/17 14.86 sec, 12/20/17= 13.71    Time  12    Period  Weeks    Status  On-going      PT LONG TERM GOAL #3   Title  Patient (> 46 years old) will complete five times sit to stand test in < 15 seconds indicating an increased LE strength and improved balance.    Baseline  5x sit to stand in 25.73 seconds. , 22.63 sec 10/24/17;; 11/22/17 15.96, 4/3 19 15.45    Time  12    Period  Weeks    Status  On-going      PT LONG TERM GOAL #4   Title  Patient will increase six minute walk test distance to >600 for progression to community ambulator and improve gait ability    Baseline  474 ft, 710 feet;,790 feet, 775 feet    Time  12    Period  Weeks    Status  On-going            Plan - 01/14/18 1056    Clinical Impression Statement  Pt requires direction and verbal cues for correct performance of standing dynamic balance exercises and strengthening exercises. .Patient has fatigue with endurance and difficulty with weight shift.   Patient struggles with posture and ability to perform sit to stand due to weakness, as well as balance with unstable surfaces. Pt encouraged continuing HEP   Patient will benefit from continued skilled PT to improve mobility and safety.    Rehab Potential  Good    PT Frequency  2x / week    PT Treatment/Interventions  Dry needling;Manual techniques;Balance training;Neuromuscular re-education;Patient/family education;Therapeutic exercise;Therapeutic activities;Functional mobility training;Stair training;Gait training;Moist Heat;Cryotherapy;Electrical Stimulation    PT Next Visit Plan  balance and strength training    Consulted and Agree with Plan of Care  Patient       Patient will benefit from skilled therapeutic intervention in order to improve the following deficits and impairments:  Abnormal gait, Decreased balance, Decreased endurance, Decreased mobility, Difficulty walking, Impaired sensation, Impaired tone, Decreased activity tolerance, Decreased strength,  Decreased safety awareness, Pain  Visit Diagnosis: Muscle weakness  Unsteadiness  Abnormality of gait  Chronic left shoulder pain  Chronic midline low back pain without sciatica     Problem List Patient Active Problem List   Diagnosis Date Noted  . Rosacea   . Gait disturbance, post-stroke 10/05/2015  . Presbycusis of both ears   . Advanced care planning/counseling discussion 06/23/2015  . Impaired mobility 04/23/2015  . HTN (hypertension) 03/18/2015  . Chronic diastolic heart failure, NYHA class 1 ([place]) 03/18/2015  . Spastic hemiparesis affecting dominant side ([place]) 07/07/2014  . Chest pressure 02/03/2014  . Medicare annual wellness visit, subsequent 11/06/2013  . Erectile dysfunction due to arterial insufficiency 02/05/2013  . MDD (major depressive disorder), recurrent episode, moderate ([place])   . Palpitations 06/26/2012  . HLD (hyperlipidemia)   . Restless leg syndrome   . GERD (gastroesophageal reflux disease)   . BPH with obstruction/lower urinary tract symptoms   . History of CVA (cerebrovascular accident) 03/11/2012  . Hemiparesis affecting right side as late effect of stroke ([place]) 03/11/2012  . Type 2 diabetes, controlled, with neuropathy ([place]) 03/11/2012  . OSA on CPAP 03/11/2012  . CKD (chronic kidney disease) stage 3, GFR 30-59 ml/min (HCC) 03/11/2012  . Migraines 03/11/2012    Alanson Puls, PT DPT 01/14/2018, 1:08 PM  [place] MAIN [facility] SERVICES [address] [place], [name], [address]  Phone: [phone]   Fax:  [phone]  Name: James Spurr, James Bell MRN: [id number] Date of Birth: 19-Nov-1930

## 2018-01-15 ENCOUNTER — Ambulatory Visit (INDEPENDENT_AMBULATORY_CARE_PROVIDER_SITE_OTHER): Payer: Medicare Other | Admitting: Family Medicine

## 2018-01-15 ENCOUNTER — Encounter: Payer: Self-pay | Admitting: Family Medicine

## 2018-01-15 VITALS — BP 118/64 | HR 70 | Temp 97.8°F | Ht 68.0 in | Wt 194.2 lb

## 2018-01-15 DIAGNOSIS — I10 Essential (primary) hypertension: Secondary | ICD-10-CM

## 2018-01-15 DIAGNOSIS — G6289 Other specified polyneuropathies: Secondary | ICD-10-CM | POA: Diagnosis not present

## 2018-01-15 DIAGNOSIS — G629 Polyneuropathy, unspecified: Secondary | ICD-10-CM | POA: Insufficient documentation

## 2018-01-15 DIAGNOSIS — E1140 Type 2 diabetes mellitus with diabetic neuropathy, unspecified: Secondary | ICD-10-CM | POA: Diagnosis not present

## 2018-01-15 DIAGNOSIS — G2581 Restless legs syndrome: Secondary | ICD-10-CM | POA: Diagnosis not present

## 2018-01-15 DIAGNOSIS — G8110 Spastic hemiplegia affecting unspecified side: Secondary | ICD-10-CM | POA: Diagnosis not present

## 2018-01-15 DIAGNOSIS — M25532 Pain in left wrist: Secondary | ICD-10-CM | POA: Diagnosis not present

## 2018-01-15 DIAGNOSIS — R002 Palpitations: Secondary | ICD-10-CM

## 2018-01-15 LAB — POCT GLYCOSYLATED HEMOGLOBIN (HGB A1C): Hemoglobin A1C: 6.3

## 2018-01-15 LAB — POCT I-STAT CREATININE: Creatinine, Ser: 1.4 mg/dL — ABNORMAL HIGH (ref 0.61–1.24)

## 2018-01-15 MED ORDER — GABAPENTIN 300 MG PO CAPS: ORAL_CAPSULE | ORAL | 1 refills | Status: DC

## 2018-01-15 MED ORDER — CAPSAICIN 0.025 % EX CREA: TOPICAL_CREAM | Freq: Two times a day (BID) | CUTANEOUS | 0 refills | Status: DC

## 2018-01-15 MED ORDER — VALSARTAN 40 MG PO TABS: 40.0000 mg | ORAL_TABLET | Freq: Every day | ORAL | 1 refills | Status: DC

## 2018-01-15 NOTE — Assessment & Plan Note (Signed)
Stable on daily metoprolol.

## 2018-01-15 NOTE — Assessment & Plan Note (Signed)
Chronic, stable. Desires to try lower valsartan dose - 40mg  1/2 tab per day. Reviewed recent ARB recall

## 2018-01-15 NOTE — Assessment & Plan Note (Addendum)
Reviewed gabapentin dosing. See instructions for recs. May be contributing to fatigue. He also uses capsaicin cream to  foot.

## 2018-01-15 NOTE — Assessment & Plan Note (Signed)
A1c wonderful control. Followed by endo [name].

## 2018-01-15 NOTE — Patient Instructions (Addendum)
Try lower valsartan dose - 1/2 tablet daily for total 20mg  daily. Continue low dose metoprolol.  Try changing gabapentin dosing to 1 in the morning or afternoon and 2 at bedtime.  You are doing well today. Let me know how you're doing.

## 2018-01-15 NOTE — Progress Notes (Signed)
BP 118/64 (BP Location: Left Arm, Patient Position: Sitting, Cuff Size: Normal)   Pulse 70   Temp 97.8 F (36.6 C) (Oral)   Ht 5\' 8"  (1.727 m)   Wt 194 lb 4 oz (88.1 kg)   SpO2 97%   BMI 29.54 kg/m    CC: 6 mo f/u visit Subjective:    Patient ID: James Spurr, MD, male    DOB: 1931-01-02, 84 y.o.   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 H/o alternating exotropia 1-2 degrees. 1 month ago started having daily headaches.   HTN - losartan changed to valsartan - now concerned about expanding ARB recall. He is somewhat concerned about low blood pressures. Possible increased fatigue. He checks regularly at home: 118/64. Endorses mild orthostatic dizziness. Compliant with current regimen which includes metoprolol 12.5mg  bid and valsartan 40mg  daily.   RLS - persistent trouble with this despite mirapex 3mg  total daily. Gabapentin 300mg  TID helped temporarily. Using capsacin for right foot burning pain, worse at night time.   Worsening L shoulder pain from overuse of L arm (from known R hemiparesis). Started receiving massage therapy to L shoulder. Has started using wrist brace because of increased pain.   Relevant past medical, surgical, family and social history reviewed and updated as indicated. Interim medical history since our last visit reviewed. Allergies and medications reviewed and updated. Outpatient Medications Prior to Visit  Medication Sig Dispense Refill  . clopidogrel (PLAVIX) 75 MG tablet TAKE 1 TABLET DAILY 90 tablet 1  . Coenzyme Q10 (COQ10) 150 MG CAPS Take 1 capsule by mouth 2 (two) times daily.    . CRESTOR 40 MG tablet TAKE 1 TABLET DAILY 90 tablet 1  .  diazepam (VALIUM) 5 MG tablet Take 1 tablet (5 mg total) by mouth every 8 (eight) hours as needed for muscle spasms. 90 tablet 0  . diclofenac sodium (VOLTAREN) 1 % GEL Apply 2 g topically 4 (four) times daily. 3 Tube 1  . DULoxetine (CYMBALTA) 60 MG capsule Take 1 capsule (60 mg total) by mouth daily. 90 capsule 1  . FOLBEE 2.5-25-1 MG TABS tablet TAKE 1 TABLET DAILY 90 tablet 1  . ibuprofen (ADVIL,MOTRIN) 200 MG tablet Take 400 mg by mouth every 6 (six) hours as needed for mild pain or moderate pain.    . Insulin Degludec (TRESIBA FLEXTOUCH) 200 UNIT/ML SOPN Inject 80 Units into the skin at bedtime.     . Insulin Pen Needle (NOVOFINE) 30G X 8 MM MISC Use to inject insulin once daily dx:E11.40 300 each 3  . levothyroxine (LEVOTHROID) 25 MCG tablet Take 1 tablet (25 mcg total) by mouth daily before breakfast. Per endo [name]    . metoprolol tartrate (LOPRESSOR) 25 MG tablet TAKE ONE-HALF (1/2) TABLET TWICE A DAY 90 tablet 0  . metroNIDAZOLE (METROCREAM) 0.75 % cream Apply 1 application topically 3 (three) times daily.    . nitroGLYCERIN (NITROSTAT) 0.4 MG SL tablet DISSOLVE 1 TABLET UNDER THE TONGUE EVERY 5 MINUTES AS NEEDED FOR CHEST PAIN 25 tablet 1  . omega-3 acid ethyl esters (LOVAZA) 1 g capsule Take 2 capsules (2 g total) by mouth 2 (two) times daily. 360 capsule 1  .  omeprazole (PRILOSEC) 40 MG capsule TAKE 1 CAPSULE DAILY 90 capsule 0  . pramipexole (MIRAPEX) 1 MG tablet Take two tablets at 3pm, extra tablet at bedtime as needed 270 tablet 1  . Semaglutide (OZEMPIC) 0.25 or 0.5 MG/DOSE SOPN Inject into the skin.    Marland Kitchen traZODone (DESYREL) 50 MG tablet Take 50 mg by mouth at bedtime as needed for sleep.    . vitamin C (ASCORBIC ACID) 500 MG tablet Take 1,000 mg by mouth daily.     Marland Kitchen gabapentin (NEURONTIN) 300 MG capsule TAKE 1 CAPSULE TWICE A DAY (NEW DIRECTIONS) 180 capsule 1  . valsartan (DIOVAN) 40 MG tablet Take 1 tablet (40 mg total) by mouth daily. 90 tablet 1  . losartan (COZAAR) 25  MG tablet Take 1 tablet (25 mg total) by mouth daily. (Patient not taking: Reported on 01/15/2018) 90 tablet 3   No facility-administered medications prior to visit.      Per HPI unless specifically indicated in ROS section below Review of Systems     Objective:    BP 118/64 (BP Location: Left Arm, Patient Position: Sitting, Cuff Size: Normal)   Pulse 70   Temp 97.8 F (36.6 C) (Oral)   Ht 5\' 8"  (1.727 m)   Wt 194 lb 4 oz (88.1 kg)   SpO2 97%   BMI 29.54 kg/m   Wt Readings from Last 3 Encounters:  01/15/18 194 lb 4 oz (88.1 kg)  12/14/17 190 lb (86.2 kg)  10/31/17 195 lb (88.5 kg)    Physical Exam  Constitutional: He appears well-developed and well-nourished. No distress.  HENT:  Mouth/Throat: Oropharynx is clear and moist. No oropharyngeal exudate.  Cardiovascular: Normal rate, regular rhythm and normal heart sounds.  No murmur heard. Pulmonary/Chest: Effort normal and breath sounds normal. No respiratory distress. He has no wheezes. He has no rales.  Musculoskeletal: He exhibits no edema.  Sore L 1st CMC  Skin: Skin is warm and dry. No rash noted.  Nursing note and vitals reviewed.  Results for orders placed or performed in visit on 01/15/18  POCT glycosylated hemoglobin (Hb A1C)  Result Value Ref Range   Hemoglobin A1C 6.3       Assessment & Plan:   Problem List Items Addressed This Visit    HTN (hypertension) - Primary    Chronic, stable. Desires to try lower valsartan dose - 40mg  1/2 tab per day. Reviewed recent ARB recall      Relevant Medications   valsartan (DIOVAN) 40 MG tablet   Left wrist pain    Possible CMC OA pain. Possible overuse from known R hemiparesis post stroke. Continue to monitor.       Palpitations    Stable on daily metoprolol.       Peripheral neuropathy    Reviewed gabapentin dosing. See instructions for recs. May be contributing to fatigue. He also uses capsaicin cream to  foot.      Relevant Medications   gabapentin  (NEURONTIN) 300 MG capsule   Restless leg syndrome    Reviewed mirapex and gabapentin dosing - see pt instructions for further details.       Spastic hemiparesis affecting dominant side (HCC)   Type 2 diabetes, controlled, with neuropathy (HCC)    A1c wonderful control. Followed by endo [name].       Relevant Medications   valsartan (DIOVAN) 40 MG tablet   Other Relevant Orders   POCT glycosylated hemoglobin (Hb A1C) (Completed)       Meds  ordered this encounter  Medications  . valsartan (DIOVAN) 40 MG tablet    Sig: Take 1 tablet (40 mg total) by mouth daily.    Dispense:  90 tablet    Refill:  1  . gabapentin (NEURONTIN) 300 MG capsule    Sig: Take 1 in the morning and 2 at night.    Dispense:  270 capsule    Refill:  1  . capsaicin (ZOSTRIX) 0.025 % cream    Sig: Apply topically 2 (two) times daily.    Dispense:  60 g    Refill:  0   Orders Placed This Encounter  Procedures  . POCT glycosylated hemoglobin (Hb A1C)    Follow up plan: No follow-ups on file.  [name], MD

## 2018-01-15 NOTE — Assessment & Plan Note (Signed)
Possible CMC OA pain. Possible overuse from known R hemiparesis post stroke. Continue to monitor.

## 2018-01-15 NOTE — Assessment & Plan Note (Signed)
Reviewed mirapex and gabapentin dosing - see pt instructions for further details.

## 2018-01-16 ENCOUNTER — Ambulatory Visit: Payer: Medicare Other | Admitting: Physical Therapy

## 2018-01-17 ENCOUNTER — Encounter: Payer: Self-pay | Admitting: Physical Therapy

## 2018-01-17 ENCOUNTER — Ambulatory Visit: Payer: Medicare Other | Attending: Physical Medicine & Rehabilitation | Admitting: Physical Therapy

## 2018-01-17 DIAGNOSIS — R269 Unspecified abnormalities of gait and mobility: Secondary | ICD-10-CM | POA: Diagnosis not present

## 2018-01-17 DIAGNOSIS — G8929 Other chronic pain: Secondary | ICD-10-CM | POA: Insufficient documentation

## 2018-01-17 DIAGNOSIS — M6281 Muscle weakness (generalized): Secondary | ICD-10-CM | POA: Diagnosis not present

## 2018-01-17 DIAGNOSIS — R2681 Unsteadiness on feet: Secondary | ICD-10-CM | POA: Insufficient documentation

## 2018-01-17 DIAGNOSIS — M25512 Pain in left shoulder: Secondary | ICD-10-CM | POA: Diagnosis not present

## 2018-01-17 NOTE — Therapy (Signed)
[place] MAIN [facility] SERVICES [address] [place], [name], [address] Phone: [phone]   Fax:  [phone]  Physical Therapy Treatment  Patient Details  Name: James HELMS, James Bell MRN: [id number] Date of Birth: June 13, 1931 Referring Provider: Charlett Blake    Encounter Date: 01/17/2018  PT End of Session - 01/17/18 1410    Visit Number  27    Number of Visits  41    Date for PT Re-Evaluation  02/14/18    Equipment Utilized During Treatment  Gait belt    Activity Tolerance  Patient limited by fatigue;Patient tolerated treatment well    Behavior During Therapy  [facility] for tasks assessed/performed       Past Medical History:  Diagnosis Date  . Anemia   . Basal cell carcinoma 2018   R temple   . Basal cell carcinoma of nose 2002   bridge of nose  . BPH with obstruction/lower urinary tract symptoms    failed flomax, uroxatral, myrbetriq - sees urology [name]) pending videourodynamics  . CKD (chronic kidney disease) stage 3, GFR 30-59 ml/min (HCC) 03/11/2012   Renal US 02/2014 - multiple kidney cysts   . CVA (cerebral infarction) 03/07/12   slurred speech; right hemplegia, left handed - completed PT 07/2014 (17 sessions)  . Depression    mild, on cymbalta per prior PCP  . Erectile dysfunction    per urology (prostaglandin injection)  . GERD (gastroesophageal reflux disease)    with sliding HH  . H/O hiatal hernia   . History of kidney problems 12/2010   lacerated left kidney after fall  . HLD (hyperlipidemia)   . Kidney cysts 02/2014   bilateral (renal US)  . Migraines 03/11/12   now rare  . OSA on CPAP    sleep study 01/2014 - severe, CPAP at 10, previously on nuvigil (Dr. [name])  . Palpitations 01/2012   holter WNL, rare PAC/PVCs  . Presbycusis of both ears 2016   rec amplification [facility])  . Restless leg syndrome    well controlled on mirapex  . Rosacea    K[place]ki  . TIA (transient ischemic attack)   . Type  2 diabetes, controlled, with neuropathy [facility]) 2011   [name] in [facility]    Past Surgical History:  Procedure Laterality Date  . ABI  2007   WNL  . CARDIAC CATHETERIZATION  2004   normal; [place] [facility]  . CARDIOVASCULAR STRESS TEST  02/2014   WNL, low risk scan, EF 68%  . CAROTID ENDARTERECTOMY    . CATARACT EXTRACTION W/ INTRAOCULAR LENS IMPLANT  11//2012 - 08/2011  . COLONOSCOPY  03/2011   poor prep, unable to biopsy polyp in tic fold, rpt 3 mo [name])  . COLONOSCOPY  06/2011   mult polyps adenomatous, severe diverticulosis, rpt 3 yrs [name])  . CYSTOSCOPY WITH URETHRAL DILATATION N/A 07/24/2013   Procedure: CYSTOSCOPY WITH URETHRAL DILATATION;  Surgeon: [name], James Bell;  Location: [facility];  Service: Urology;  Laterality: N/A;  . ESOPHAGOGASTRODUODENOSCOPY  2006   duodenitis, medual HH, Grade 2 reflux, mild gastritis  . ESOPHAGOGASTRODUODENOSCOPY  07/2011   mild chronic gastritis  . [place]  . [place]; 2002   left, then repaired  . MRI brain  04/2010   WNL  . PROSTATE SURGERY  02/2009   PVP (laser)  . SKIN CANCER EXCISION  2002   bridge of nose, BCC  . TONSILLECTOMY AND ADENOIDECTOMY  ~  [place] N/A 07/24/2013   Procedure: TRANSURETHRAL INCISION OF THE PROSTATE (TUIP);  Surgeon: [name], James Bell;  Location: [facility];  Service: Urology;  Laterality: N/A;  . URETHRAL DILATION  2014   tannenbaum    There were no vitals filed for this visit.  Subjective Assessment - 01/17/18 1409    Subjective  Pt reports that his back pain is better today .     Pertinent History  Patinet had CVA March 11 2012. He had rehab with hospital and SNF and then outpatient therapy. He wears a lumbar corset to decrease his back pain if he is standing.     How long can you sit comfortably?  unlimited    How long can you stand comfortably?  10 or 15 mins    How long can you walk comfortably?  15 mins using a rollator outdoors.     Patient Stated Goals  Patient wants to be able to stand for longer periods and walk for longer periods.    Currently in Pain?  No/denies    Pain Score  0-No pain    Pain Onset  1 to 4 weeks ago       Neuromuscular Re-education   Marching in place on blue foam pad x 30 seconds for 2 sets   Tandem standing in // bars  x 1 minute Step ups to blue foam pad x 10 bilaterally for 2 sets bilaterally   Feet together on blue foam and holding ball x 1 minute, holding ball and fwd and bwd movement elbow flex/ext x 20, horizontal abd/add with ball and arms extended. Toe taps on AIREX + step 3x10 no Ue Fwd step up onto 6inch step from AIREX no UE Fwd step up onto 4inch step no UE x 10 each side CGA to min A needed cues for wt Resisted walking fwd, retro, bil side stepping with12.5# x 3 laps each; min A throughout due to pt being unsteady and losing balance multiple times throughout each direction; increased time to complete task                        PT Education - 01/17/18 1410    Education provided  Yes    Education Details  HEP    Person(s) Educated  Patient    Methods  Explanation;Demonstration    Comprehension  Verbalized understanding;Returned demonstration       PT Short Term Goals - 10/24/17 0858      PT SHORT TERM GOAL #1   Title  Patient will ascend and descend step without railing and use of hemiwalker without loss of balance to be able to safely get into his house.    Baseline  Patient continues to use the railing with steps    Time  6    Period  Weeks    Status  On-going    Target Date  11/14/17        PT Long Term Goals - 12/20/17 1038      PT LONG TERM GOAL #1   Title  Patient will be able to stand for 1 hour without increase in pain to demonstrate improved tolerance for ADLs.     Baseline  15 mins:     Time  12    Period  Weeks    Status  On-going      PT LONG TERM GOAL #2   Title  Patient will reduce timed  up and go to <11 seconds to  reduce fall risk and demonstrate improved transfer/gait ability.    Baseline  26 sec, 10/24/17 18.83 sec;  11/22/17 14.86 sec, 12/20/17= 13.71    Time  12    Period  Weeks    Status  On-going      PT LONG TERM GOAL #3   Title  Patient (> 14 years old) will complete five times sit to stand test in < 15 seconds indicating an increased LE strength and improved balance.    Baseline  5x sit to stand in 25.73 seconds. , 22.63 sec 10/24/17;; 11/22/17 15.96, 4/3 19 15.45    Time  12    Period  Weeks    Status  On-going      PT LONG TERM GOAL #4   Title  Patient will increase six minute walk test distance to >600 for progression to community ambulator and improve gait ability    Baseline  474 ft, 710 feet;,790 feet, 775 feet    Time  12    Period  Weeks    Status  On-going            Plan - 01/17/18 1410    Clinical Impression Statement  Instructed patient in advanced LE strengthening and balance exercise; Patient requires min Vcs for correct exercise technique including to slow down LE movement and increase ROM for better strengthening; Patient demonstrates better tandem stance being able to maintain on uneven surface with CGA;  Patient continues to have trouble with SLS being able to hold only 3-5 sec each LE; Patient would benefit from additional skilled PT Intervention to improve LE strength, balance and gait safety and return to PLOF.    Rehab Potential  Good    PT Frequency  2x / week    PT Treatment/Interventions  Dry needling;Manual techniques;Balance training;Neuromuscular re-education;Patient/family education;Therapeutic exercise;Therapeutic activities;Functional mobility training;Stair training;Gait training;Moist Heat;Cryotherapy;Electrical Stimulation    PT Next Visit Plan  balance and strength training    Consulted and Agree with Plan of Care  Patient       Patient will benefit from skilled therapeutic intervention in order to improve the following deficits and impairments:  Abnormal  gait, Decreased balance, Decreased endurance, Decreased mobility, Difficulty walking, Impaired sensation, Impaired tone, Decreased activity tolerance, Decreased strength, Decreased safety awareness, Pain  Visit Diagnosis: Muscle weakness  Unsteadiness  Abnormality of gait  Chronic left shoulder pain     Problem List Patient Active Problem List   Diagnosis Date Noted  . Peripheral neuropathy 01/15/2018  . Left wrist pain 01/15/2018  . Rosacea   . Gait disturbance, post-stroke 10/05/2015  . Presbycusis of both ears   . Advanced care planning/counseling discussion 06/23/2015  . Impaired mobility 04/23/2015  . HTN (hypertension) 03/18/2015  . Chronic diastolic heart failure, NYHA class 1 ([place]) 03/18/2015  . Spastic hemiparesis affecting dominant side ([place]) 07/07/2014  . Chest pressure 02/03/2014  . Medicare annual wellness visit, subsequent 11/06/2013  . Erectile dysfunction due to arterial insufficiency 02/05/2013  . MDD (major depressive disorder), recurrent episode, moderate ([place])   . Palpitations 06/26/2012  . HLD (hyperlipidemia)   . Restless leg syndrome   . GERD (gastroesophageal reflux disease)   . BPH with obstruction/lower urinary tract symptoms   . History of CVA (cerebrovascular accident) 03/11/2012  . Hemiparesis affecting right side as late effect of stroke ([place]) 03/11/2012  . Type 2 diabetes, controlled, with neuropathy ([place]) 03/11/2012  . OSA on CPAP 03/11/2012  . CKD (  chronic kidney disease) stage 3, GFR 30-59 ml/min (HCC) 03/11/2012  . Migraines 03/11/2012    Alanson Puls, PT DPT 01/17/2018, 2:12 PM  [place] MAIN [facility] SERVICES [address] [place], [name], [address] Phone: [phone]   Fax:  [phone]  Name: James MATERA, James Bell MRN: [id number] Date of Birth: [birth year]

## 2018-01-21 ENCOUNTER — Encounter: Payer: Self-pay | Admitting: Physical Therapy

## 2018-01-21 ENCOUNTER — Ambulatory Visit: Payer: Medicare Other | Admitting: Physical Therapy

## 2018-01-21 DIAGNOSIS — M25512 Pain in left shoulder: Secondary | ICD-10-CM | POA: Diagnosis not present

## 2018-01-21 DIAGNOSIS — M6281 Muscle weakness (generalized): Secondary | ICD-10-CM

## 2018-01-21 DIAGNOSIS — R2681 Unsteadiness on feet: Secondary | ICD-10-CM | POA: Diagnosis not present

## 2018-01-21 DIAGNOSIS — G8929 Other chronic pain: Secondary | ICD-10-CM | POA: Diagnosis not present

## 2018-01-21 DIAGNOSIS — R269 Unspecified abnormalities of gait and mobility: Secondary | ICD-10-CM | POA: Diagnosis not present

## 2018-01-21 NOTE — Therapy (Signed)
[place] MAIN [facility] SERVICES [address] [place], [name], [address] Phone: [phone]   Fax:  [phone]  Physical Therapy Treatment  Patient Details  Name: James MOUSEL, MD MRN: [id number] Date of Birth: May 02, 1941 Referring Provider: Charlett Blake    Encounter Date: 01/21/2018  PT End of Session - 01/21/18 0943    Visit Number  28    Number of Visits  41    Date for PT Re-Evaluation  02/14/18    PT Start Time  0935    PT Stop Time  1015    PT Time Calculation (min)  40 min    Equipment Utilized During Treatment  Gait belt    Activity Tolerance  Patient limited by fatigue;Patient tolerated treatment well    Behavior During Therapy  [facility] for tasks assessed/performed       Past Medical History:  Diagnosis Date  . Anemia   . Basal cell carcinoma 2018   R temple   . Basal cell carcinoma of nose 2002   bridge of nose  . BPH with obstruction/lower urinary tract symptoms    failed flomax, uroxatral, myrbetriq - sees urology [name]) pending videourodynamics  . CKD (chronic kidney disease) stage 3, GFR 30-59 ml/min (HCC) 03/11/2012   Renal US 02/2014 - multiple kidney cysts   . CVA (cerebral infarction) 03/07/12   slurred speech; right hemplegia, left handed - completed PT 07/2014 (17 sessions)  . Depression    mild, on cymbalta per prior PCP  . Erectile dysfunction    per urology (prostaglandin injection)  . GERD (gastroesophageal reflux disease)    with sliding HH  . H/O hiatal hernia   . History of kidney problems 12/2010   lacerated left kidney after fall  . HLD (hyperlipidemia)   . Kidney cysts 02/2014   bilateral (renal US)  . Migraines 03/11/12   now rare  . OSA on CPAP    sleep study 01/2014 - severe, CPAP at 10, previously on nuvigil (Dr. [name])  . Palpitations 01/2012   holter WNL, rare PAC/PVCs  . Presbycusis of both ears 2016   rec amplification [facility])  . Restless leg syndrome    well  controlled on mirapex  . Rosacea    [place]owalski  . TIA (transient ischemic attack)   . Type 2 diabetes, controlled, with neuropathy [facility]) 2011   [name] in [facility]    Past Surgical History:  Procedure Laterality Date  . ABI  2007   WNL  . CARDIAC CATHETERIZATION  2004   normal; [place] [facility]  . CARDIOVASCULAR STRESS TEST  02/2014   WNL, low risk scan, EF 68%  . CAROTID ENDARTERECTOMY    . CATARACT EXTRACTION W/ INTRAOCULAR LENS IMPLANT  11//2012 - 08/2011  . COLONOSCOPY  03/2011   poor prep, unable to biopsy polyp in tic fold, rpt 3 mo [name])  . COLONOSCOPY  06/2011   mult polyps adenomatous, severe diverticulosis, rpt 3 yrs [name])  . CYSTOSCOPY WITH URETHRAL DILATATION N/A 07/24/2013   Procedure: CYSTOSCOPY WITH URETHRAL DILATATION;  Surgeon: [name], MD;  [facility];  Service: Urology;  Laterality: N/A;  . ESOPHAGOGASTRODUODENOSCOPY  2006   duodenitis, medual HH, Grade 2 reflux, mild gastritis  . ESOPHAGOGASTRODUODENOSCOPY  07/2011   mild chronic gastritis  . [place]  . [place]; 2002   left, then repaired  . MRI brain  04/2010   WNL  . PROSTATE SURGERY  02/2009   PVP (laser)  . SKIN CANCER EXCISION  2002   bridge of nose, BCC  . TONSILLECTOMY AND ADENOIDECTOMY  ~ 1945  . TRANSURETHRAL INCISION OF PROSTATE N/A 07/24/2013   Procedure: TRANSURETHRAL INCISION OF THE PROSTATE (TUIP);  Surgeon: [name], MD;  [facility];  Service: Urology;  Laterality: N/A;  . URETHRAL DILATION  2014   tannenbaum    There were no vitals filed for this visit.  Subjective Assessment - 01/21/18 0941    Subjective  Pt reports that his back pain is better today .     Pertinent History  Patinet had CVA March 11 2012. He had rehab with hospital and SNF and then outpatient therapy. He wears a lumbar corset to decrease his back pain if he is standing.     How long can you sit comfortably?  unlimited    How long can you stand comfortably?   10 or 15 mins    How long can you walk comfortably?  15 mins using a rollator outdoors.    Patient Stated Goals  Patient wants to be able to stand for longer periods and walk for longer periods.    Currently in Pain?  Yes    Pain Score  3     Pain Location  Shoulder    Pain Orientation  Left    Pain Descriptors / Indicators  Aching    Pain Radiating Towards  IR of right shoulder    Pain Onset  1 to 4 weeks ago      Treatment: Warm up x 5 mins nu step Toe taps on AIREX + step 3x10 no UE, BLE Standing on blue foam and tapping left and right onto other foam surface x 20 each LE Rocker board fwd/ bwd tilt x 2 mins Fwd step up onto 6inch step from AIREX no UE x 20  Fwd step up onto 4inch step no UE x 10 each side CGA to min A needed cues for wt Resisted walking fwd, retro, bil side stepping with12.5# x 3 laps each; min A throughout due to pt being unsteady and losing balance multiple times throughout each direction; increased time to complete task                        PT Education - 01/21/18 0943    Education provided  Yes    Education Details  HEP    Person(s) Educated  Patient    Methods  Explanation    Comprehension  Verbalized understanding       PT Short Term Goals - 10/24/17 0858      PT SHORT TERM GOAL #1   Title  Patient will ascend and descend step without railing and use of hemiwalker without loss of balance to be able to safely get into his house.    Baseline  Patient continues to use the railing with steps    Time  6    Period  Weeks    Status  On-going    Target Date  11/14/17        PT Long Term Goals - 01/21/18 1050      PT LONG TERM GOAL #1   Title  Patient will be able to stand for 1 hour without increase in pain to demonstrate improved tolerance for ADLs.     Baseline  15 mins:     Time  12    Period  Weeks  Status  Partially Met    Target Date  02/14/18      PT LONG TERM GOAL #2   Title  Patient will reduce timed up and go  to <11 seconds to reduce fall risk and demonstrate improved transfer/gait ability.    Baseline  26 sec, 10/24/17 18.83 sec;  11/22/17 14.86 sec, 12/20/17= 13.71    Time  12    Period  Weeks    Status  Partially Met    Target Date  02/14/18      PT LONG TERM GOAL #3   Title  Patient (> 43 years old) will complete five times sit to stand test in < 15 seconds indicating an increased LE strength and improved balance.    Baseline  5x sit to stand in 25.73 seconds. , 22.63 sec 10/24/17;; 11/22/17 15.96, 4/3 19 15.45    Time  12    Period  Weeks    Status  Partially Met    Target Date  02/14/18      PT LONG TERM GOAL #4   Title  Patient will increase six minute walk test distance to >600 for progression to community ambulator and improve gait ability    Baseline  474 ft, 710 feet;,790 feet, 775 feet    Time  12    Period  Weeks    Status  Partially Met    Target Date  02/14/18              Patient will benefit from skilled therapeutic intervention in order to improve the following deficits and impairments:     Visit Diagnosis: Muscle weakness  Unsteadiness  Abnormality of gait  Chronic left shoulder pain     Problem List Patient Active Problem List   Diagnosis Date Noted  . Peripheral neuropathy 01/15/2018  . Left wrist pain 01/15/2018  . Rosacea   . Gait disturbance, post-stroke 10/05/2015  . Presbycusis of both ears   . Advanced care planning/counseling discussion 06/23/2015  . Impaired mobility 04/23/2015  . HTN (hypertension) 03/18/2015  . Chronic diastolic heart failure, NYHA class 1 ([place]) 03/18/2015  . Spastic hemiparesis affecting dominant side ([place]) 07/07/2014  . Chest pressure 02/03/2014  . Medicare annual wellness visit, subsequent 11/06/2013  . Erectile dysfunction due to arterial insufficiency 02/05/2013  . MDD (major depressive disorder), recurrent episode, moderate ([place])   . Palpitations 06/26/2012  . HLD (hyperlipidemia)   . Restless leg syndrome   .  GERD (gastroesophageal reflux disease)   . BPH with obstruction/lower urinary tract symptoms   . History of CVA (cerebrovascular accident) 03/11/2012  . Hemiparesis affecting right side as late effect of stroke ([place]) 03/11/2012  . Type 2 diabetes, controlled, with neuropathy ([place]) 03/11/2012  . OSA on CPAP 03/11/2012  . CKD (chronic kidney disease) stage 3, GFR 30-59 ml/min (HCC) 03/11/2012  . Migraines 03/11/2012    Alanson Puls, PT DPT 01/21/2018, 10:51 AM  [place] MAIN [facility] SERVICES [address] [place], [name], [address] Phone: [phone]   Fax:  [phone]  Name: ALDRIN ENGELHARD, MD MRN: [id number] Date of Birth: 1931/08/27

## 2018-01-23 ENCOUNTER — Ambulatory Visit: Payer: Medicare Other | Admitting: Physical Therapy

## 2018-01-23 ENCOUNTER — Encounter: Payer: Self-pay | Admitting: Physical Therapy

## 2018-01-23 DIAGNOSIS — G8929 Other chronic pain: Secondary | ICD-10-CM | POA: Diagnosis not present

## 2018-01-23 DIAGNOSIS — M25512 Pain in left shoulder: Secondary | ICD-10-CM | POA: Diagnosis not present

## 2018-01-23 DIAGNOSIS — M6281 Muscle weakness (generalized): Secondary | ICD-10-CM

## 2018-01-23 DIAGNOSIS — R269 Unspecified abnormalities of gait and mobility: Secondary | ICD-10-CM | POA: Diagnosis not present

## 2018-01-23 DIAGNOSIS — R2681 Unsteadiness on feet: Secondary | ICD-10-CM | POA: Diagnosis not present

## 2018-01-23 NOTE — Therapy (Signed)
[place] MAIN [facility] SERVICES [address] [place], [name], [address] Phone: [phone]   Fax:  [phone]  Physical Therapy Treatment  Patient Details  Name: James SCHRACK, James Bell MRN: [id number] Date of Birth: September 23, 1930 Referring Provider: Charlett Blake    Encounter Date: 01/23/2018  PT End of Session - 01/23/18 1032    Visit Number  29    Number of Visits  41    Date for PT Re-Evaluation  02/14/18    PT Start Time  0930    PT Stop Time  1015    PT Time Calculation (min)  45 min    Equipment Utilized During Treatment  Gait belt    Activity Tolerance  Patient limited by fatigue;Patient tolerated treatment well    Behavior During Therapy  [facility] for tasks assessed/performed       Past Medical History:  Diagnosis Date  . Anemia   . Basal cell carcinoma 2018   R temple   . Basal cell carcinoma of nose 2002   bridge of nose  . BPH with obstruction/lower urinary tract symptoms    failed flomax, uroxatral, myrbetriq - sees urology [name]) pending videourodynamics  . CKD (chronic kidney disease) stage 3, GFR 30-59 ml/min (HCC) 03/11/2012   Renal US 02/2014 - multiple kidney cysts   . CVA (cerebral infarction) 03/07/12   slurred speech; right hemplegia, left handed - completed PT 07/2014 (17 sessions)  . Depression    mild, on cymbalta per prior PCP  . Erectile dysfunction    per urology (prostaglandin injection)  . GERD (gastroesophageal reflux disease)    with sliding HH  . H/O hiatal hernia   . History of kidney problems 12/2010   lacerated left kidney after fall  . HLD (hyperlipidemia)   . Kidney cysts 02/2014   bilateral (renal US)  . Migraines 03/11/12   now rare  . OSA on CPAP    sleep study 01/2014 - severe, CPAP at 10, previously on nuvigil (Dr. [name])  . Palpitations 01/2012   holter WNL, rare PAC/PVCs  . Presbycusis of both ears 2016   rec amplification [facility])  . Restless leg syndrome    well  controlled on mirapex  . Rosacea    Kowalski  . TIA (transient ischemic attack)   . Type 2 diabetes, controlled, with neuropathy [facility]) 2011   [name] in [facility]    Past Surgical History:  Procedure Laterality Date  . ABI  2007   WNL  . CARDIAC CATHETERIZATION  2004   normal; [place]  . CARDIOVASCULAR STRESS TEST  02/2014   WNL, low risk scan, EF 68%  . CAROTID ENDARTERECTOMY    . CATARACT EXTRACTION W/ INTRAOCULAR LENS IMPLANT  11//2012 - 08/2011  . COLONOSCOPY  03/2011   poor prep, unable to biopsy polyp in tic fold, rpt 3 mo [name])  . COLONOSCOPY  06/2011   mult polyps adenomatous, severe diverticulosis, rpt 3 yrs [name])  . CYSTOSCOPY WITH URETHRAL DILATATION N/A 07/24/2013   Procedure: CYSTOSCOPY WITH URETHRAL DILATATION;  Surgeon: [name], James Bell;  Location: [facility];  Service: Urology;  Laterality: N/A;  . ESOPHAGOGASTRODUODENOSCOPY  2006   duodenitis, medual HH, Grade 2 reflux, mild gastritis  . ESOPHAGOGASTRODUODENOSCOPY  07/2011   mild chronic gastritis  . [place]  . [place]; 2002   left, then repaired  . MRI brain  04/2010   WNL  . PROSTATE SURGERY  02/2009   PVP (laser)  . SKIN CANCER EXCISION  2002   bridge of nose, BCC  . TONSILLECTOMY AND ADENOIDECTOMY  ~ 1945  . TRANSURETHRAL INCISION OF PROSTATE N/A 07/24/2013   Procedure: TRANSURETHRAL INCISION OF THE PROSTATE (TUIP);  Surgeon: [name], James Bell;  Location: [facility];  Service: Urology;  Laterality: N/A;  . URETHRAL DILATION  2014   tannenbaum    There were no vitals filed for this visit.  Subjective Assessment - 01/23/18 1003    Subjective  Pt reports that his back pain is better today .     Pertinent History  Patinet had CVA March 11 2012. He had rehab with hospital and SNF and then outpatient therapy. He wears a lumbar corset to decrease his back pain if he is standing.     How long can you sit comfortably?  unlimited    How long can you stand comfortably?   10 or 15 mins    How long can you walk comfortably?  15 mins using a rollator outdoors.    Patient Stated Goals  Patient wants to be able to stand for longer periods and walk for longer periods.    Currently in Pain?  No/denies    Pain Score  0-No pain    Pain Onset  1 to 4 weeks ago       Gait training with loftstrand crutch with SBA and VC for sequencing and safety 100 feet x 2 and navigating small spaces and sidestepping in small areas.   :   NEUROMUSCULAR RE-EDUCATION  1/2 foam flat side up and balance with head turns left and right feet apart and feet together, cues to turn head slowly  side stepping left and right in parallel bars on airbeam 10 feet x 3, VC for weight shift fwd  step ups from floor to 6 inch stool x 20 bilateral, vc for foot position on stool  Leg press 100 lbs x 20 x 2 , vc not to snap knees and to perform slowly  Eccentric step downs from 6 inch stool to purple air ex x 5 x 2 sets BLE, VC to gently tap heel   Patient needs occasional verbal cueing to improve posture and cueing to correctly perform exercises slowly,                      PT Education - 01/23/18 1032    Education provided  Yes    Education Details  gait trianing wiht loftstrand crutches    Person(s) Educated  Patient    Methods  Explanation;Demonstration;Verbal cues    Comprehension  Verbalized understanding;Returned demonstration;Verbal cues required       PT Short Term Goals - 10/24/17 0858      PT SHORT TERM GOAL #1   Title  Patient will ascend and descend step without railing and use of hemiwalker without loss of balance to be able to safely get into his house.    Baseline  Patient continues to use the railing with steps    Time  6    Period  Weeks    Status  On-going    Target Date  11/14/17        PT Long Term Goals - 01/21/18 1050      PT LONG TERM GOAL #1   Title  Patient will be able to stand for 1 hour without increase in pain to demonstrate  improved tolerance for ADLs.  Baseline  15 mins:     Time  12    Period  Weeks    Status  Partially Met    Target Date  02/14/18      PT LONG TERM GOAL #2   Title  Patient will reduce timed up and go to <11 seconds to reduce fall risk and demonstrate improved transfer/gait ability.    Baseline  26 sec, 10/24/17 18.83 sec;  11/22/17 14.86 sec, 12/20/17= 13.71    Time  12    Period  Weeks    Status  Partially Met    Target Date  02/14/18      PT LONG TERM GOAL #3   Title  Patient (> 13 years old) will complete five times sit to stand test in < 15 seconds indicating an increased LE strength and improved balance.    Baseline  5x sit to stand in 25.73 seconds. , 22.63 sec 10/24/17;; 11/22/17 15.96, 4/3 19 15.45    Time  12    Period  Weeks    Status  Partially Met    Target Date  02/14/18      PT LONG TERM GOAL #4   Title  Patient will increase six minute walk test distance to >600 for progression to community ambulator and improve gait ability    Baseline  474 ft, 710 feet;,790 feet, 775 feet    Time  12    Period  Weeks    Status  Partially Met    Target Date  02/14/18            Plan - 01/23/18 1033    Clinical Impression Statement  Pt presents with unsteadiness on uneven surfaces and fatigues with therapeutic exercises. Patient needs assist with instruction for gait with loftstrand crutches, and needs CGA assist with balance activities. Patient demonstrates difficulty with side stepping  and navigating small spaces with decreased base of support and increased challenges for LE.  Patient tolerated all interventions well this date and will benefit from continued skilled PT interventions to improve strength and balance and decrease risk of falling    Rehab Potential  Good    PT Frequency  2x / week    PT Treatment/Interventions  Dry needling;Manual techniques;Balance training;Neuromuscular re-education;Patient/family education;Therapeutic exercise;Therapeutic activities;Functional  mobility training;Stair training;Gait training;Moist Heat;Cryotherapy;Electrical Stimulation    PT Next Visit Plan  balance and strength training    Consulted and Agree with Plan of Care  Patient       Patient will benefit from skilled therapeutic intervention in order to improve the following deficits and impairments:  Abnormal gait, Decreased balance, Decreased endurance, Decreased mobility, Difficulty walking, Impaired sensation, Impaired tone, Decreased activity tolerance, Decreased strength, Decreased safety awareness, Pain  Visit Diagnosis: Muscle weakness  Unsteadiness  Abnormality of gait  Chronic left shoulder pain     Problem List Patient Active Problem List   Diagnosis Date Noted  . Peripheral neuropathy 01/15/2018  . Left wrist pain 01/15/2018  . Rosacea   . Gait disturbance, post-stroke 10/05/2015  . Presbycusis of both ears   . Advanced care planning/counseling discussion 06/23/2015  . Impaired mobility 04/23/2015  . HTN (hypertension) 03/18/2015  . Chronic diastolic heart failure, NYHA class 1 ([place]) 03/18/2015  . Spastic hemiparesis affecting dominant side ([place]) 07/07/2014  . Chest pressure 02/03/2014  . Medicare annual wellness visit, subsequent 11/06/2013  . Erectile dysfunction due to arterial insufficiency 02/05/2013  . MDD (major depressive disorder), recurrent episode, moderate ([place])   . Palpitations  06/26/2012  . HLD (hyperlipidemia)   . Restless leg syndrome   . GERD (gastroesophageal reflux disease)   . BPH with obstruction/lower urinary tract symptoms   . History of CVA (cerebrovascular accident) 03/11/2012  . Hemiparesis affecting right side as late effect of stroke ([place]) 03/11/2012  . Type 2 diabetes, controlled, with neuropathy ([place]) 03/11/2012  . OSA on CPAP 03/11/2012  . CKD (chronic kidney disease) stage 3, GFR 30-59 ml/min (HCC) 03/11/2012  . Migraines 03/11/2012    Alanson Puls, PT DPT 01/23/2018, 10:37 AM  [place] MAIN [facility] SERVICES [place], [name], [address] Phone: [phone]   Fax:  [phone]  Name: James MEDLEN, James Bell MRN: [id number] Date of Birth: 08-Jan-1931

## 2018-01-27 ENCOUNTER — Other Ambulatory Visit: Payer: Self-pay | Admitting: Physical Medicine & Rehabilitation

## 2018-01-27 ENCOUNTER — Other Ambulatory Visit: Payer: Self-pay | Admitting: Family Medicine

## 2018-01-28 ENCOUNTER — Ambulatory Visit: Payer: Medicare Other | Admitting: Physical Therapy

## 2018-01-28 ENCOUNTER — Telehealth: Payer: Self-pay | Admitting: Family Medicine

## 2018-01-28 NOTE — Telephone Encounter (Signed)
Recieved electronic medication refill request for diclofenac gel, last time this med was ordered was 05-14-17, no mention in any notes to use or continue to use this medication.  Unsure if ok to refill this medication, please advise.

## 2018-01-28 NOTE — Telephone Encounter (Signed)
Copied from [place] [id number]. Topic: General - Other >> Jan 28, 2018  8:42 AM Oneta Rack wrote: Relation to pt: self Call back number: [phone] Pharmacy: [facility] [place], [place] [phone] (Phone) [phone] (Fax)   Reason for call:  Patient requesting "tresiba insulin" stating he contacted the pharmacy they informed it would be 3 days, patient completely out if PCP can expedite, please advise

## 2018-01-28 NOTE — Telephone Encounter (Signed)
I spoke with pt; per 01/15/18 office note pt followed by [name] in endo.  Pt said [name] prescribed the tresiba. Pt is waiting on refill from mail order but is presently out of tresiba; Dr Darnell Level per current and hx med list has not filled tresiba before; pt to contact Dr Cindra Eves office for refill Tyler Aas to local pharmacy until can get Mail order med. Pt voiced understanding and is to contact Dr Cindra Eves office. FYI to Dr Darnell Level.

## 2018-01-28 NOTE — Telephone Encounter (Signed)
Request for James Bell 200 unit/ml. Med by historical provider.  LOV 01/15/18 NOV 07/18/18  Provider Dr. Danise Mina  Pharmacy  [facility] [phone]  [name] Pt states that he is out of medication.

## 2018-01-28 NOTE — Telephone Encounter (Signed)
Noted. Thanks.

## 2018-01-30 ENCOUNTER — Ambulatory Visit: Payer: Medicare Other | Admitting: Physical Therapy

## 2018-01-30 ENCOUNTER — Encounter: Payer: Self-pay | Admitting: Physical Therapy

## 2018-01-30 DIAGNOSIS — M25512 Pain in left shoulder: Secondary | ICD-10-CM

## 2018-01-30 DIAGNOSIS — R269 Unspecified abnormalities of gait and mobility: Secondary | ICD-10-CM | POA: Diagnosis not present

## 2018-01-30 DIAGNOSIS — G8929 Other chronic pain: Secondary | ICD-10-CM

## 2018-01-30 DIAGNOSIS — M6281 Muscle weakness (generalized): Secondary | ICD-10-CM

## 2018-01-30 DIAGNOSIS — R2681 Unsteadiness on feet: Secondary | ICD-10-CM

## 2018-01-30 NOTE — Therapy (Signed)
[place] MAIN [facility] SERVICES [address] [place], [name], [address] Phone: [phone]   Fax:  [phone]  Physical Therapy Treatment Physical Therapy Progress Note   Dates of reporting period  12/20/17   to  01/30/18  Patient Details  Name: James POLI, James Bell MRN: [id number] Date of Birth: 02-28-1931 Referring Provider: Charlett Blake    Encounter Date: 01/30/2018  PT End of Session - 01/30/18 1309    Visit Number  30    Number of Visits  41    Date for PT Re-Evaluation  02/14/18    PT Start Time  0100    PT Stop Time  0140    PT Time Calculation (min)  40 min    Equipment Utilized During Treatment  Gait belt    Activity Tolerance  Patient limited by fatigue;Patient tolerated treatment well    Behavior During Therapy  [facility] for tasks assessed/performed       Past Medical History:  Diagnosis Date  . Anemia   . Basal cell carcinoma 2018   R temple   . Basal cell carcinoma of nose 2002   bridge of nose  . BPH with obstruction/lower urinary tract symptoms    failed flomax, uroxatral, myrbetriq - sees urology [name]) pending videourodynamics  . CKD (chronic kidney disease) stage 3, GFR 30-59 ml/min (HCC) 03/11/2012   Renal US 02/2014 - multiple kidney cysts   . CVA (cerebral infarction) 03/07/12   slurred speech; right hemplegia, left handed - completed PT 07/2014 (17 sessions)  . Depression    mild, on cymbalta per prior PCP  . Erectile dysfunction    per urology (prostaglandin injection)  . GERD (gastroesophageal reflux disease)    with sliding HH  . H/O hiatal hernia   . History of kidney problems 12/2010   lacerated left kidney after fall  . HLD (hyperlipidemia)   . Kidney cysts 02/2014   bilateral (renal US)  . Migraines 03/11/12   now rare  . OSA on CPAP    sleep study 01/2014 - severe, CPAP at 10, previously on nuvigil (Dr. [name])  . Palpitations 01/2012   holter WNL, rare PAC/PVCs  . Presbycusis of  both ears 2016   rec amplification [facility])  . Restless leg syndrome    well controlled on mirapex  . Rosacea    Kowalski  . TIA (transient ischemic attack)   . Type 2 diabetes, controlled, with neuropathy [facility]) 2011   [name] in [facility]    Past Surgical History:  Procedure Laterality Date  . ABI  2007   WNL  . CARDIAC CATHETERIZATION  2004   normal; [place]  . CARDIOVASCULAR STRESS TEST  02/2014   WNL, low risk scan, EF 68%  . CAROTID ENDARTERECTOMY    . CATARACT EXTRACTION W/ INTRAOCULAR LENS IMPLANT  11//2012 - 08/2011  . COLONOSCOPY  03/2011   poor prep, unable to biopsy polyp in tic fold, rpt 3 mo [name])  . COLONOSCOPY  06/2011   mult polyps adenomatous, severe diverticulosis, rpt 3 yrs [name])  . CYSTOSCOPY WITH URETHRAL DILATATION N/A 07/24/2013   Procedure: CYSTOSCOPY WITH URETHRAL DILATATION;  Surgeon: [name], James Bell;  [facility];  Service: Urology;  Laterality: N/A;  . ESOPHAGOGASTRODUODENOSCOPY  2006   duodenitis, medual HH, Grade 2 reflux, mild gastritis  . ESOPHAGOGASTRODUODENOSCOPY  07/2011   mild chronic gastritis  . [place]  . [place]; 2002  left, then repaired  . MRI brain  04/2010   WNL  . PROSTATE SURGERY  02/2009   PVP (laser)  . SKIN CANCER EXCISION  2002   bridge of nose, BCC  . TONSILLECTOMY AND ADENOIDECTOMY  ~ 1945  . TRANSURETHRAL INCISION OF PROSTATE N/A 07/24/2013   Procedure: TRANSURETHRAL INCISION OF THE PROSTATE (TUIP);  Surgeon: [name], James Bell;  [facility];  Service: Urology;  Laterality: N/A;  . URETHRAL DILATION  2014   tannenbaum    There were no vitals filed for this visit.  Subjective Assessment - 01/30/18 1307    Subjective  Pt reports that he has not had his insulin this week. It did not get deivered.     Pertinent History  Patinet had CVA March 11 2012. He had rehab with hospital and SNF and then outpatient therapy. He wears a lumbar corset to decrease his back  pain if he is standing.     How long can you sit comfortably?  unlimited    How long can you stand comfortably?  10 or 15 mins    How long can you walk comfortably?  15 mins using a rollator outdoors.    Patient Stated Goals  Patient wants to be able to stand for longer periods and walk for longer periods.    Currently in Pain?  No/denies    Pain Score  0-No pain    Pain Onset  1 to 4 weeks ago      Treatment: Nu-step ( warm up ) x 5 mins Side stepping with head control in parallel bars x 4 laps , CGA and UE support  Stepping onto AIREX, then on to 4 inch step followed by stepping down onto AIREX and then level surface in //bars x15.Stepping over and back hurdle  x10 bilaterally  ; CGA , UE support Side step and back over hurdle x10 bilaterally; CGA UE support Side stepping on blue foam beam x 5, CGA, UE support intermittent Toe taps on BOSU x 10 bilateral, alternating; cues for posture and foot position     Pt required occasional UE assist, and performance improved with each repetition.  Patient needs cues for posture and correct technique.                           PT Education - 01/30/18 1308    Education provided  Yes    Education Details  HEP techniques    Person(s) Educated  Patient    Methods  Explanation;Demonstration    Comprehension  Verbalized understanding;Returned demonstration       PT Short Term Goals - 10/24/17 0858      PT SHORT TERM GOAL #1   Title  Patient will ascend and descend step without railing and use of hemiwalker without loss of balance to be able to safely get into his house.    Baseline  Patient continues to use the railing with steps    Time  6    Period  Weeks    Status  On-going    Target Date  11/14/17        PT Long Term Goals - 01/21/18 1050      PT LONG TERM GOAL #1   Title  Patient will be able to stand for 1 hour without increase in pain to demonstrate improved tolerance for ADLs.     Baseline  15 mins:      Time  12  Period  Weeks    Status  Partially Met    Target Date  02/14/18      PT LONG TERM GOAL #2   Title  Patient will reduce timed up and go to <11 seconds to reduce fall risk and demonstrate improved transfer/gait ability.    Baseline  26 sec, 10/24/17 18.83 sec;  11/22/17 14.86 sec, 12/20/17= 13.71    Time  12    Period  Weeks    Status  Partially Met    Target Date  02/14/18      PT LONG TERM GOAL #3   Title  Patient (> 18 years old) will complete five times sit to stand test in < 15 seconds indicating an increased LE strength and improved balance.    Baseline  5x sit to stand in 25.73 seconds. , 22.63 sec 10/24/17;; 11/22/17 15.96, 4/3 19 15.45    Time  12    Period  Weeks    Status  Partially Met    Target Date  02/14/18      PT LONG TERM GOAL #4   Title  Patient will increase six minute walk test distance to >600 for progression to community ambulator and improve gait ability    Baseline  474 ft, 710 feet;,790 feet, 775 feet    Time  12    Period  Weeks    Status  Partially Met    Target Date  02/14/18            Plan - 01/30/18 1309    Clinical Impression Statement Patient's condition has the potential to improve in response to therapy. Maximum improvement is yet to be obtained. The anticipated improvement is attainable and reasonable in a generally predictable time. Start date of reporting period 12/20/17 end date of reporting period 01/30/18 Patient reports that his left shoulder is better and he is walking outdoors more often and with less fear with use of the loftstrand crutch.  Patient instructed in advanced LE strengthening; patient required min-mod VCs for correct positioning; He had increased difficulty with dynamic steps and quick movements especially unsupported; Patient would benefit from additional skilled PT intervention to improve strength, balance and return to PLOF.    Rehab Potential  Good    PT Frequency  2x / week    PT Treatment/Interventions  Dry  needling;Manual techniques;Balance training;Neuromuscular re-education;Patient/family education;Therapeutic exercise;Therapeutic activities;Functional mobility training;Stair training;Gait training;Moist Heat;Cryotherapy;Electrical Stimulation    PT Next Visit Plan  balance and strength training    Consulted and Agree with Plan of Care  Patient       Patient will benefit from skilled therapeutic intervention in order to improve the following deficits and impairments:  Abnormal gait, Decreased balance, Decreased endurance, Decreased mobility, Difficulty walking, Impaired sensation, Impaired tone, Decreased activity tolerance, Decreased strength, Decreased safety awareness, Pain  Visit Diagnosis: Muscle weakness  Unsteadiness  Abnormality of gait  Chronic left shoulder pain     Problem List Patient Active Problem List   Diagnosis Date Noted  . Peripheral neuropathy 01/15/2018  . Left wrist pain 01/15/2018  . Rosacea   . Gait disturbance, post-stroke 10/05/2015  . Presbycusis of both ears   . Advanced care planning/counseling discussion 06/23/2015  . Impaired mobility 04/23/2015  . HTN (hypertension) 03/18/2015  . Chronic diastolic heart failure, NYHA class 1 ([place]) 03/18/2015  . Spastic hemiparesis affecting dominant side ([place]) 07/07/2014  . Chest pressure 02/03/2014  . Medicare annual wellness visit, subsequent 11/06/2013  . Erectile dysfunction due  to arterial insufficiency 02/05/2013  . MDD (major depressive disorder), recurrent episode, moderate ([place])   . Palpitations 06/26/2012  . HLD (hyperlipidemia)   . Restless leg syndrome   . GERD (gastroesophageal reflux disease)   . BPH with obstruction/lower urinary tract symptoms   . History of CVA (cerebrovascular accident) 03/11/2012  . Hemiparesis affecting right side as late effect of stroke ([place]) 03/11/2012  . Type 2 diabetes, controlled, with neuropathy ([place]) 03/11/2012  . OSA on CPAP 03/11/2012  . CKD (chronic kidney  disease) stage 3, GFR 30-59 ml/min (HCC) 03/11/2012  . Migraines 03/11/2012    Alanson Puls, PT DPT 01/30/2018, 2:20 PM  [place] MAIN [facility] SERVICES [address] [place], [name], [address] Phone: [phone]   Fax:  [phone]  Name: James PULTZ, James Bell MRN: [id number] Date of Birth: August 17, 1931

## 2018-01-31 ENCOUNTER — Encounter: Payer: Self-pay | Admitting: Physical Medicine & Rehabilitation

## 2018-01-31 ENCOUNTER — Encounter: Payer: Medicare Other | Attending: Physical Medicine & Rehabilitation

## 2018-01-31 ENCOUNTER — Ambulatory Visit (HOSPITAL_BASED_OUTPATIENT_CLINIC_OR_DEPARTMENT_OTHER): Payer: Medicare Other | Admitting: Physical Medicine & Rehabilitation

## 2018-01-31 VITALS — BP 103/56 | HR 72 | Resp 14 | Ht 68.0 in | Wt 191.0 lb

## 2018-01-31 DIAGNOSIS — R252 Cramp and spasm: Secondary | ICD-10-CM | POA: Insufficient documentation

## 2018-01-31 DIAGNOSIS — G8110 Spastic hemiplegia affecting unspecified side: Secondary | ICD-10-CM | POA: Diagnosis not present

## 2018-01-31 NOTE — Patient Instructions (Signed)

## 2018-01-31 NOTE — Progress Notes (Signed)
RUE Botox Injection for spasticity using needle EMG guidance  Dilution: 200 Units/ml Indication: Severe spasticity which interferes with ADL,mobility and/or  hygiene and is unresponsive to medication management and other conservative care Informed consent was obtained after describing risks and benefits of the procedure with the patient. This includes bleeding, bruising, infection, excessive weakness, or medication side effects. A REMS form is on file and signed. Needle: 27g 1" needle electrode Number of units per muscle  Biceps75 FCR25  FDS25  Lumbrical 1,2 and 3, 12.5 Units each=37.5  FDP with estim to Identify DIP flexion digits 2 and 3, 12.5 units injected  All injections were done after obtaining appropriate EMG activity and after negative drawback for blood. The patient tolerated the procedure well. Post procedure instructions were given. A followup appointment was made.   Based EMG may be able to forgo the Lumbrical injections  Patient complained of right dorsum of the foot numbness and burning at night.  He is a diabetic.  He is currently taking gabapentin 1 tablet 300 mg at 3 PM and 1 tablet at at bedtime.  Recommend that he increases to 1 tablet at 3 PM and 2 tablets nightly.  If this is helpful we will need to rewrite the prescription

## 2018-02-04 ENCOUNTER — Ambulatory Visit: Payer: Medicare Other | Admitting: Physical Therapy

## 2018-02-04 ENCOUNTER — Encounter: Payer: Self-pay | Admitting: Physical Therapy

## 2018-02-04 DIAGNOSIS — M6281 Muscle weakness (generalized): Secondary | ICD-10-CM | POA: Diagnosis not present

## 2018-02-04 DIAGNOSIS — G8929 Other chronic pain: Secondary | ICD-10-CM | POA: Diagnosis not present

## 2018-02-04 DIAGNOSIS — R2681 Unsteadiness on feet: Secondary | ICD-10-CM

## 2018-02-04 DIAGNOSIS — M25512 Pain in left shoulder: Secondary | ICD-10-CM

## 2018-02-04 DIAGNOSIS — R269 Unspecified abnormalities of gait and mobility: Secondary | ICD-10-CM | POA: Diagnosis not present

## 2018-02-04 NOTE — Therapy (Signed)
[place] MAIN [facility] SERVICES [address] [place], [name], [address] Phone: [phone]   Fax:  [phone]  Physical Therapy Treatment  Patient Details  Name: James HARK, James Bell MRN: [id number] Date of Birth: 1931/08/07 Referring Provider: Charlett Blake    Encounter Date: 02/04/2018  PT End of Session - 02/04/18 1311    Visit Number  31    Number of Visits  41    Date for PT Re-Evaluation  02/14/18    PT Start Time  0105    PT Stop Time  0150    PT Time Calculation (min)  45 min    Equipment Utilized During Treatment  Gait belt    Activity Tolerance  Patient limited by fatigue;Patient tolerated treatment well    Behavior During Therapy  [facility] for tasks assessed/performed       Past Medical History:  Diagnosis Date  . Anemia   . Basal cell carcinoma 2018   R temple   . Basal cell carcinoma of nose 2002   bridge of nose  . BPH with obstruction/lower urinary tract symptoms    failed flomax, uroxatral, myrbetriq - sees urology [name]) pending videourodynamics  . CKD (chronic kidney disease) stage 3, GFR 30-59 ml/min (HCC) 03/11/2012   Renal US 02/2014 - multiple kidney cysts   . CVA (cerebral infarction) 03/07/12   slurred speech; right hemplegia, left handed - completed PT 07/2014 (17 sessions)  . Depression    mild, on cymbalta per prior PCP  . Erectile dysfunction    per urology (prostaglandin injection)  . GERD (gastroesophageal reflux disease)    with sliding HH  . H/O hiatal hernia   . History of kidney problems 12/2010   lacerated left kidney after fall  . HLD (hyperlipidemia)   . Kidney cysts 02/2014   bilateral (renal US)  . Migraines 03/11/12   now rare  . OSA on CPAP    sleep study 01/2014 - severe, CPAP at 10, previously on nuvigil (Dr. [name])  . Palpitations 01/2012   holter WNL, rare PAC/PVCs  . Presbycusis of both ears 2016   rec amplification [facility])  . Restless leg syndrome    well  controlled on mirapex  . Rosacea    [place]ki  . TIA (transient ischemic attack)   . Type 2 diabetes, controlled, with neuropathy [facility]) 2011   [name] in [facility]    Past Surgical History:  Procedure Laterality Date  . ABI  2007   WNL  . CARDIAC CATHETERIZATION  2004   normal; [place]  . CARDIOVASCULAR STRESS TEST  02/2014   WNL, low risk scan, EF 68%  . CAROTID ENDARTERECTOMY    . CATARACT EXTRACTION W/ INTRAOCULAR LENS IMPLANT  11//2012 - 08/2011  . COLONOSCOPY  03/2011   poor prep, unable to biopsy polyp in tic fold, rpt 3 mo [name])  . COLONOSCOPY  06/2011   mult polyps adenomatous, severe diverticulosis, rpt 3 yrs [name])  . CYSTOSCOPY WITH URETHRAL DILATATION N/A 07/24/2013   Procedure: CYSTOSCOPY WITH URETHRAL DILATATION;  Surgeon: [name], James Bell;  Location: [facility];  Service: Urology;  Laterality: N/A;  . ESOPHAGOGASTRODUODENOSCOPY  2006   duodenitis, medual HH, Grade 2 reflux, mild gastritis  . ESOPHAGOGASTRODUODENOSCOPY  07/2011   mild chronic gastritis  . [place]  . [place]; 2002   left, then repaired  . MRI brain  04/2010   WNL  . PROSTATE SURGERY  02/2009   PVP (laser)  . SKIN CANCER EXCISION  2002   bridge of nose, BCC  . TONSILLECTOMY AND ADENOIDECTOMY  ~ 1945  . TRANSURETHRAL INCISION OF PROSTATE N/A 07/24/2013   Procedure: TRANSURETHRAL INCISION OF THE PROSTATE (TUIP);  Surgeon: [name], James Bell;  Location: [facility];  Service: Urology;  Laterality: N/A;  . URETHRAL DILATION  2014   tannenbaum    There were no vitals filed for this visit.  Subjective Assessment - 02/04/18 1308    Subjective  Pt reports that he has not had his insulin this week. It did not get deivered.     Pertinent History  Patinet had CVA March 11 2012. He had rehab with hospital and SNF and then outpatient therapy. He wears a lumbar corset to decrease his back pain if he is standing.     How long can you sit comfortably?  unlimited    How  long can you stand comfortably?  10 or 15 mins    How long can you walk comfortably?  15 mins using a rollator outdoors.    Patient Stated Goals  Patient wants to be able to stand for longer periods and walk for longer periods.    Currently in Pain?  No/denies    Pain Score  0-No pain    Pain Onset  1 to 4 weeks ago       Treatment: Nu-step 5 mins  Stepping onto AIREX, then on to 4 inch step followed by stepping down onto AIREX and then level surface in //bars x15. Stepping over 1/2 foam and back x10 bilaterally  , CGA Side step and back x10 bilaterally with verbal cueing  Side stepping on blue foam beam x 5, cues for posture 2 disk in stepping pattern; head turns left and right and cues to not use UE support Step up to 6 inch stool from purple foam pad x 20 , CGA Tapping from purple pad to target for single leg balance training with cues not to use UE support and for correct posture, CGA      Pt required occasional UE assist, and performance improved with each repetition                     PT Education - 02/04/18 1401    Education provided  Yes    Education Details  HEP    Person(s) Educated  Patient    Methods  Explanation;Demonstration;Tactile cues    Comprehension  Verbalized understanding;Returned demonstration       PT Short Term Goals - 10/24/17 0867      PT SHORT TERM GOAL #1   Title  Patient will ascend and descend step without railing and use of hemiwalker without loss of balance to be able to safely get into his house.    Baseline  Patient continues to use the railing with steps    Time  6    Period  Weeks    Status  On-going    Target Date  11/14/17        PT Long Term Goals - 01/21/18 1050      PT LONG TERM GOAL #1   Title  Patient will be able to stand for 1 hour without increase in pain to demonstrate improved tolerance for ADLs.     Baseline  15 mins:     Time  12    Period  Weeks    Status  Partially Met  Target Date  02/14/18       PT LONG TERM GOAL #2   Title  Patient will reduce timed up and go to <11 seconds to reduce fall risk and demonstrate improved transfer/gait ability.    Baseline  26 sec, 10/24/17 18.83 sec;  11/22/17 14.86 sec, 12/20/17= 13.71    Time  12    Period  Weeks    Status  Partially Met    Target Date  02/14/18      PT LONG TERM GOAL #3   Title  Patient (> 55 years old) will complete five times sit to stand test in < 15 seconds indicating an increased LE strength and improved balance.    Baseline  5x sit to stand in 25.73 seconds. , 22.63 sec 10/24/17;; 11/22/17 15.96, 4/3 19 15.45    Time  12    Period  Weeks    Status  Partially Met    Target Date  02/14/18      PT LONG TERM GOAL #4   Title  Patient will increase six minute walk test distance to >600 for progression to community ambulator and improve gait ability    Baseline  474 ft, 710 feet;,790 feet, 775 feet    Time  12    Period  Weeks    Status  Partially Met    Target Date  02/14/18            Plan - 02/04/18 1324    Clinical Impression Statement  Patient instructed in advanced balance/strengthening exercise. Patient fatigues quickly requiring short rest breaks in between advanced exercise. Patient instructed in advanced strengthening with increased repetition/resistance. Patient requires CGA for advanced balance exercise especially with less rail assist. Patient would benefit from additional skilled PT intervention to improve balance/gait safety and reduce fall risk.    Rehab Potential  Good    PT Frequency  2x / week    PT Treatment/Interventions  Dry needling;Manual techniques;Balance training;Neuromuscular re-education;Patient/family education;Therapeutic exercise;Therapeutic activities;Functional mobility training;Stair training;Gait training;Moist Heat;Cryotherapy;Electrical Stimulation    PT Next Visit Plan  balance and strength training    Consulted and Agree with Plan of Care  Patient       Patient will benefit from  skilled therapeutic intervention in order to improve the following deficits and impairments:  Abnormal gait, Decreased balance, Decreased endurance, Decreased mobility, Difficulty walking, Impaired sensation, Impaired tone, Decreased activity tolerance, Decreased strength, Decreased safety awareness, Pain  Visit Diagnosis: Muscle weakness  Unsteadiness  Abnormality of gait  Chronic left shoulder pain     Problem List Patient Active Problem List   Diagnosis Date Noted  . Peripheral neuropathy 01/15/2018  . Left wrist pain 01/15/2018  . Rosacea   . Gait disturbance, post-stroke 10/05/2015  . Presbycusis of both ears   . Advanced care planning/counseling discussion 06/23/2015  . Impaired mobility 04/23/2015  . HTN (hypertension) 03/18/2015  . Chronic diastolic heart failure, NYHA class 1 ([place]) 03/18/2015  . Spastic hemiparesis affecting dominant side ([place]) 07/07/2014  . Chest pressure 02/03/2014  . Medicare annual wellness visit, subsequent 11/06/2013  . Erectile dysfunction due to arterial insufficiency 02/05/2013  . MDD (major depressive disorder), recurrent episode, moderate ([place])   . Palpitations 06/26/2012  . HLD (hyperlipidemia)   . Restless leg syndrome   . GERD (gastroesophageal reflux disease)   . BPH with obstruction/lower urinary tract symptoms   . History of CVA (cerebrovascular accident) 03/11/2012  . Hemiparesis affecting right side as late effect of stroke ([place]) 03/11/2012  .  Type 2 diabetes, controlled, with neuropathy ([place]) 03/11/2012  . OSA on CPAP 03/11/2012  . CKD (chronic kidney disease) stage 3, GFR 30-59 ml/min (HCC) 03/11/2012  . Migraines 03/11/2012    Arelia Sneddon S,PT, DPT 02/04/2018, 2:03 PM  [place] MAIN [facility] SERVICES [address] [place], [name], [address] Phone: [phone]   Fax:  [phone]  Name: James OBRYANT, James Bell MRN: [id number] Date of Birth: [birth year]

## 2018-02-06 ENCOUNTER — Ambulatory Visit: Payer: Medicare Other | Admitting: Physical Therapy

## 2018-02-06 DIAGNOSIS — R2681 Unsteadiness on feet: Secondary | ICD-10-CM | POA: Diagnosis not present

## 2018-02-06 DIAGNOSIS — M6281 Muscle weakness (generalized): Secondary | ICD-10-CM

## 2018-02-06 DIAGNOSIS — M25512 Pain in left shoulder: Secondary | ICD-10-CM

## 2018-02-06 DIAGNOSIS — R269 Unspecified abnormalities of gait and mobility: Secondary | ICD-10-CM

## 2018-02-06 DIAGNOSIS — G8929 Other chronic pain: Secondary | ICD-10-CM

## 2018-02-07 ENCOUNTER — Encounter: Payer: Self-pay | Admitting: Physical Therapy

## 2018-02-07 NOTE — Therapy (Signed)
[place] MAIN [facility] SERVICES [address] [place], [name], [address] Phone: [phone]   Fax:  [phone]  Physical Therapy Treatment  Patient Details  Name: James KISSLER, James Bell MRN: [id number] Date of Birth: Feb 21, 1931 Referring Provider: Charlett Blake    Encounter Date: 02/06/2018  PT End of Session - 02/07/18 0910    Visit Number  32    Number of Visits  41    Date for PT Re-Evaluation  02/14/18    PT Start Time  0100    PT Stop Time  0145    PT Time Calculation (min)  45 min    Equipment Utilized During Treatment  Gait belt    Activity Tolerance  Patient limited by fatigue;Patient tolerated treatment well    Behavior During Therapy  [facility] for tasks assessed/performed       Past Medical History:  Diagnosis Date  . Anemia   . Basal cell carcinoma 2018   R temple   . Basal cell carcinoma of nose 2002   bridge of nose  . BPH with obstruction/lower urinary tract symptoms    failed flomax, uroxatral, myrbetriq - sees urology [name]) pending videourodynamics  . CKD (chronic kidney disease) stage 3, GFR 30-59 ml/min (HCC) 03/11/2012   Renal US 02/2014 - multiple kidney cysts   . CVA (cerebral infarction) 03/07/12   slurred speech; right hemplegia, left handed - completed PT 07/2014 (17 sessions)  . Depression    mild, on cymbalta per prior PCP  . Erectile dysfunction    per urology (prostaglandin injection)  . GERD (gastroesophageal reflux disease)    with sliding HH  . H/O hiatal hernia   . History of kidney problems 12/2010   lacerated left kidney after fall  . HLD (hyperlipidemia)   . Kidney cysts 02/2014   bilateral (renal US)  . Migraines 03/11/12   now rare  . OSA on CPAP    sleep study 01/2014 - severe, CPAP at 10, previously on nuvigil (Dr. [name])  . Palpitations 01/2012   holter WNL, rare PAC/PVCs  . Presbycusis of both ears 2016   rec amplification [facility])  . Restless leg syndrome    well  controlled on mirapex  . Rosacea    [place]  . TIA (transient ischemic attack)   . Type 2 diabetes, controlled, with neuropathy [name]) 2011   [name] in [facility]    Past Surgical History:  Procedure Laterality Date  . ABI  2007   WNL  . CARDIAC CATHETERIZATION  2004   normal; [place]  . CARDIOVASCULAR STRESS TEST  02/2014   WNL, low risk scan, EF 68%  . CAROTID ENDARTERECTOMY    . CATARACT EXTRACTION W/ INTRAOCULAR LENS IMPLANT  11//2012 - 08/2011  . COLONOSCOPY  03/2011   poor prep, unable to biopsy polyp in tic fold, rpt 3 mo [name])  . COLONOSCOPY  06/2011   mult polyps adenomatous, severe diverticulosis, rpt 3 yrs [name])  . CYSTOSCOPY WITH URETHRAL DILATATION N/A 07/24/2013   Procedure: CYSTOSCOPY WITH URETHRAL DILATATION;  Surgeon: [name], James Bell;  [facility];  Service: Urology;  Laterality: N/A;  . ESOPHAGOGASTRODUODENOSCOPY  2006   duodenitis, medual HH, Grade 2 reflux, mild gastritis  . ESOPHAGOGASTRODUODENOSCOPY  07/2011   mild chronic gastritis  . [place]  . [place]; 2002   left, then repaired  . MRI brain  04/2010   WNL  . PROSTATE SURGERY  02/2009   PVP (laser)  . SKIN CANCER EXCISION  2002   bridge of nose, BCC  . TONSILLECTOMY AND ADENOIDECTOMY  ~ 1945  . TRANSURETHRAL INCISION OF PROSTATE N/A 07/24/2013   Procedure: TRANSURETHRAL INCISION OF THE PROSTATE (TUIP);  Surgeon: [name], James Bell;  [facility];  Service: Urology;  Laterality: N/A;  . URETHRAL DILATION  2014   tannenbaum    There were no vitals filed for this visit.  Subjective Assessment - 02/07/18 0909    Subjective  Pt reports that he is doing better today.     Pertinent History  Patinet had CVA March 11 2012. He had rehab with hospital and SNF and then outpatient therapy. He wears a lumbar corset to decrease his back pain if he is standing.     How long can you sit comfortably?  unlimited    How long can you stand comfortably?  10  or 15 mins    How long can you walk comfortably?  15 mins using a rollator outdoors.    Patient Stated Goals  Patient wants to be able to stand for longer periods and walk for longer periods.    Currently in Pain?  No/denies    Pain Score  0-No pain    Pain Onset  1 to 4 weeks ago       Neuromuscular training: 1/2 foam flat side up and balance with head turns left and right feet apart and feet together,; cues for keeping balance and using ankle and hips to maintain center of gravity Standing on 1/2 foam , and green pad ; cues to maintain balance and posture correction standing hip abd with YTB x 20  ; cues for correct position and technique side stepping left and right in parallel bars 10 feet x 3; rotation cues for upright posture and not rotating hips towards the direction of motion step ups from floor to 6 inch stool x 20 bilateral; cues to try not to use UE and to keep correct head position marching in parallel bars x 20; cues to raise up knees level with therapists hands Tilt board fwd/bwd, side to side left and right; cues for posture correction x 2 mins  Stepping over hurdle fwd x 20 with CGA,mod VCs to increase step length for better balance;    Good dynamic balance here with minimal B LE fatigue. Patient shows good control when pushing off lead leg and is able to do so with adequate power. Patient instructed to put 80% of body weight into lead leg and to keep hip/knee/ankle alignment during stepping task.                    PT Education - 02/07/18 0909    Education provided  Yes    Education Details  HEP    Person(s) Educated  Patient    Methods  Explanation    Comprehension  Verbalized understanding       PT Short Term Goals - 10/24/17 0858      PT SHORT TERM GOAL #1   Title  Patient will ascend and descend step without railing and use of hemiwalker without loss of balance to be able to safely get into his house.    Baseline  Patient continues to use  the railing with steps    Time  6    Period  Weeks    Status  On-going    Target Date  11/14/17  PT Long Term Goals - 01/21/18 1050      PT LONG TERM GOAL #1   Title  Patient will be able to stand for 1 hour without increase in pain to demonstrate improved tolerance for ADLs.     Baseline  15 mins:     Time  12    Period  Weeks    Status  Partially Met    Target Date  02/14/18      PT LONG TERM GOAL #2   Title  Patient will reduce timed up and go to <11 seconds to reduce fall risk and demonstrate improved transfer/gait ability.    Baseline  26 sec, 10/24/17 18.83 sec;  11/22/17 14.86 sec, 12/20/17= 13.71    Time  12    Period  Weeks    Status  Partially Met    Target Date  02/14/18      PT LONG TERM GOAL #3   Title  Patient (> 74 years old) will complete five times sit to stand test in < 15 seconds indicating an increased LE strength and improved balance.    Baseline  5x sit to stand in 25.73 seconds. , 22.63 sec 10/24/17;; 11/22/17 15.96, 4/3 19 15.45    Time  12    Period  Weeks    Status  Partially Met    Target Date  02/14/18      PT LONG TERM GOAL #4   Title  Patient will increase six minute walk test distance to >600 for progression to community ambulator and improve gait ability    Baseline  474 ft, 710 feet;,790 feet, 775 feet    Time  12    Period  Weeks    Status  Partially Met    Target Date  02/14/18            Plan - 02/07/18 0911    Clinical Impression Statement  Dynamic and static balance interventions continued today, with a focus on unilateral LE stability.  Pt tolerated all exercises well.  Intermediate dynamic standing balance tasks were progressed with cga needed for weight shifting tasks. .  LE strength exercises were progressed today with focus on unilateral strength and stability to focus on increasing LE strength and stability with daily tasks.  Pt would continue to benefit from skilled therapy services in order to further address LE strength  deficits and balance deficits in order to decrease fall risk and improve mobility.    Rehab Potential  Good    PT Frequency  2x / week    PT Treatment/Interventions  Dry needling;Manual techniques;Balance training;Neuromuscular re-education;Patient/family education;Therapeutic exercise;Therapeutic activities;Functional mobility training;Stair training;Gait training;Moist Heat;Cryotherapy;Electrical Stimulation    PT Next Visit Plan  balance and strength training    Consulted and Agree with Plan of Care  Patient       Patient will benefit from skilled therapeutic intervention in order to improve the following deficits and impairments:  Abnormal gait, Decreased balance, Decreased endurance, Decreased mobility, Difficulty walking, Impaired sensation, Impaired tone, Decreased activity tolerance, Decreased strength, Decreased safety awareness, Pain  Visit Diagnosis: Muscle weakness  Unsteadiness  Abnormality of gait  Chronic left shoulder pain     Problem List Patient Active Problem List   Diagnosis Date Noted  . Peripheral neuropathy 01/15/2018  . Left wrist pain 01/15/2018  . Rosacea   . Gait disturbance, post-stroke 10/05/2015  . Presbycusis of both ears   . Advanced care planning/counseling discussion 06/23/2015  . Impaired mobility 04/23/2015  .  HTN (hypertension) 03/18/2015  . Chronic diastolic heart failure, NYHA class 1 ([place]) 03/18/2015  . Spastic hemiparesis affecting dominant side ([place]) 07/07/2014  . Chest pressure 02/03/2014  . Medicare annual wellness visit, subsequent 11/06/2013  . Erectile dysfunction due to arterial insufficiency 02/05/2013  . MDD (major depressive disorder), recurrent episode, moderate ([place])   . Palpitations 06/26/2012  . HLD (hyperlipidemia)   . Restless leg syndrome   . GERD (gastroesophageal reflux disease)   . BPH with obstruction/lower urinary tract symptoms   . History of CVA (cerebrovascular accident) 03/11/2012  . Hemiparesis affecting  right side as late effect of stroke ([place]) 03/11/2012  . Type 2 diabetes, controlled, with neuropathy ([place]) 03/11/2012  . OSA on CPAP 03/11/2012  . CKD (chronic kidney disease) stage 3, GFR 30-59 ml/min (HCC) 03/11/2012  . Migraines 03/11/2012    Alanson Puls, PT DPT 02/07/2018, 9:12 AM  [place] MAIN [facility] SERVICES [place], [name], [address] Phone: [phone]   Fax:  [phone]  Name: James KANDLER, James Bell MRN: [id number] Date of Birth: 03/03/1931

## 2018-02-13 ENCOUNTER — Ambulatory Visit: Payer: Medicare Other | Admitting: Physical Therapy

## 2018-02-13 DIAGNOSIS — M25512 Pain in left shoulder: Secondary | ICD-10-CM | POA: Diagnosis not present

## 2018-02-13 DIAGNOSIS — M6281 Muscle weakness (generalized): Secondary | ICD-10-CM

## 2018-02-13 DIAGNOSIS — R269 Unspecified abnormalities of gait and mobility: Secondary | ICD-10-CM

## 2018-02-13 DIAGNOSIS — R2681 Unsteadiness on feet: Secondary | ICD-10-CM

## 2018-02-13 DIAGNOSIS — G8929 Other chronic pain: Secondary | ICD-10-CM | POA: Diagnosis not present

## 2018-02-14 ENCOUNTER — Encounter: Payer: Self-pay | Admitting: Physical Therapy

## 2018-02-14 NOTE — Therapy (Signed)
[place] MAIN [facility] SERVICES [address] [place], [name], [address] Phone: [phone]   Fax:  [phone]  Physical Therapy Treatment  Patient Details  Name: James EWING, MD MRN: [id number] Date of Birth: 82-Dec-1932 Referring Provider: Charlett Blake    Encounter Date: 02/13/2018  PT End of Session - 02/14/18 1126    Visit Number  33    Number of Visits  41    Date for PT Re-Evaluation  02/14/18    PT Start Time  0100    PT Stop Time  0145    PT Time Calculation (min)  45 min    Equipment Utilized During Treatment  Gait belt    Activity Tolerance  Patient limited by fatigue;Patient tolerated treatment well    Behavior During Therapy  [facility] for tasks assessed/performed       Past Medical History:  Diagnosis Date  . Anemia   . Basal cell carcinoma 2018   R temple   . Basal cell carcinoma of nose 2002   bridge of nose  . BPH with obstruction/lower urinary tract symptoms    failed flomax, uroxatral, myrbetriq - sees urology [name]) pending videourodynamics  . CKD (chronic kidney disease) stage 3, GFR 30-59 ml/min (HCC) 03/11/2012   Renal US 02/2014 - multiple kidney cysts   . CVA (cerebral infarction) 03/07/12   slurred speech; right hemplegia, left handed - completed PT 07/2014 (17 sessions)  . Depression    mild, on cymbalta per prior PCP  . Erectile dysfunction    per urology (prostaglandin injection)  . GERD (gastroesophageal reflux disease)    with sliding HH  . H/O hiatal hernia   . History of kidney problems 12/2010   lacerated left kidney after fall  . HLD (hyperlipidemia)   . Kidney cysts 02/2014   bilateral (renal US)  . Migraines 03/11/12   now rare  . OSA on CPAP    sleep study 01/2014 - severe, CPAP at 10, previously on nuvigil (Dr. [name])  . Palpitations 01/2012   holter WNL, rare PAC/PVCs  . Presbycusis of both ears 2016   rec amplification [facility])  . Restless leg syndrome    well  controlled on mirapex  . Rosacea    Kowalski  . TIA (transient ischemic attack)   . Type 2 diabetes, controlled, with neuropathy [facility]) 2011   [name] in [facility]    Past Surgical History:  Procedure Laterality Date  . ABI  2007   WNL  . CARDIAC CATHETERIZATION  2004   normal; [place] [facility]  . CARDIOVASCULAR STRESS TEST  02/2014   WNL, low risk scan, EF 68%  . CAROTID ENDARTERECTOMY    . CATARACT EXTRACTION W/ INTRAOCULAR LENS IMPLANT  11//2012 - 08/2011  . COLONOSCOPY  03/2011   poor prep, unable to biopsy polyp in tic fold, rpt 3 mo [name])  . COLONOSCOPY  06/2011   mult polyps adenomatous, severe diverticulosis, rpt 3 yrs [name])  . CYSTOSCOPY WITH URETHRAL DILATATION N/A 07/24/2013   Procedure: CYSTOSCOPY WITH URETHRAL DILATATION;  Surgeon: [name], MD;  Location: [facility];  Service: Urology;  Laterality: N/A;  . ESOPHAGOGASTRODUODENOSCOPY  2006   duodenitis, medual HH, Grade 2 reflux, mild gastritis  . ESOPHAGOGASTRODUODENOSCOPY  07/2011   mild chronic gastritis  . [place]  . [place]; 2002   left, then repaired  . MRI brain  04/2010   WNL  . PROSTATE SURGERY  02/2009   PVP (laser)  . SKIN CANCER EXCISION  2002   bridge of nose, BCC  . TONSILLECTOMY AND ADENOIDECTOMY  ~ 1945  . TRANSURETHRAL INCISION OF PROSTATE N/A 07/24/2013   Procedure: TRANSURETHRAL INCISION OF THE PROSTATE (TUIP);  Surgeon: [name], MD;  Location: [facility];  Service: Urology;  Laterality: N/A;  . URETHRAL DILATION  2014   tannenbaum    There were no vitals filed for this visit.  Subjective Assessment - 02/14/18 1123    Subjective  Pt reports that he is doing better today.     Pertinent History  P[place]et had CVA March 11 2012. He had rehab with hospital and SNF and then outpatient therapy. He wears a lumbar corset to decrease his back pain if he is standing.     How long can you sit comfortably?  unlimited    How long can you stand comfortably?  10  or 15 mins    How long can you walk comfortably?  15 mins using a rollator outdoors.    Patient Stated Goals  Patient wants to be able to stand for longer periods and walk for longer periods.    Currently in Pain?  No/denies    Pain Score  0-No pain    Pain Onset  1 to 4 weeks ago       Neuromuscular training:   Resisted walking fwd, retro, bil side stepping with12.5# x 3 laps each; min A throughout due to pt being unsteady and losing balance multiple times throughout each direction; increased time to complete task  Alternate toe taps on 4 inch step without rail assist x15 with cues to step backwards for better foot placement and avoid toe catching; Standing one foot on airex, one foot on 4 inch step with BUE ball pass side/side x10 each foot on step with min A for safety and cues to improve trunk rotation for better balance challenge; Modified tandem stance with BUE ball up/down x10 each foot in front with CGA for safety;  Standing on airex beam:  Tandem stance on airex beam without rail assist 10 sec hold x4 each foot in front with CGA for safety and cues to improve upper trunk control for better balance control; Side stepping down airex beam without rail assist x3 laps each direction with cues to keep feet on beam and avoid stepping off; Standing with feet apart, BUE ball toss x10 unsupportedwith close supervision; Patient exhibits posterior loss of balance requiring cues for forward weight shift; Rocker board side to side , fwd/ bwd x 15 each way ; cues for posture and weight shifting.                        PT Education - 02/14/18 1123    Education provided  Yes    Education Details  HEP    Person(s) Educated  Patient    Methods  Explanation    Comprehension  Verbalized understanding       PT Short Term Goals - 10/24/17 0858      PT SHORT TERM GOAL #1   Title  Patient will ascend and descend step without railing and use of hemiwalker without loss of  balance to be able to safely get into his house.    Baseline  Patient continues to use the railing with steps    Time  6    Period  Weeks    Status  On-going    Target  Date  11/14/17        PT Long Term Goals - 01/21/18 1050      PT LONG TERM GOAL #1   Title  Patient will be able to stand for 1 hour without increase in pain to demonstrate improved tolerance for ADLs.     Baseline  15 mins:     Time  12    Period  Weeks    Status  Partially Met    Target Date  02/14/18      PT LONG TERM GOAL #2   Title  Patient will reduce timed up and go to <11 seconds to reduce fall risk and demonstrate improved transfer/gait ability.    Baseline  26 sec, 10/24/17 18.83 sec;  11/22/17 14.86 sec, 12/20/17= 13.71    Time  12    Period  Weeks    Status  Partially Met    Target Date  02/14/18      PT LONG TERM GOAL #3   Title  Patient (> 43 years old) will complete five times sit to stand test in < 15 seconds indicating an increased LE strength and improved balance.    Baseline  5x sit to stand in 25.73 seconds. , 22.63 sec 10/24/17;; 11/22/17 15.96, 4/3 19 15.45    Time  12    Period  Weeks    Status  Partially Met    Target Date  02/14/18      PT LONG TERM GOAL #4   Title  Patient will increase six minute walk test distance to >600 for progression to community ambulator and improve gait ability    Baseline  474 ft, 710 feet;,790 feet, 775 feet    Time  12    Period  Weeks    Status  Partially Met    Target Date  02/14/18            Plan - 02/14/18 1127    Clinical Impression Statement  Dynamic and static balance interventions continued today, with a focus on unilateral LE stability. Pt tolerated all exercises well. Intermediate dynamic standing balance tasks were progressed with cga needed for weight shifting tasks. . LE strength exercises were progressed today with focus on unilateral strength and stability to focus on increasing LE strength and stability with daily tasks. All long term  goals were partially met. Patient is being discharged from PT today to HEP.   Rehab Potential  Good    PT Frequency  2x / week    PT Treatment/Interventions  Dry needling;Manual techniques;Balance training;Neuromuscular re-education;Patient/family education;Therapeutic exercise;Therapeutic activities;Functional mobility training;Stair training;Gait training;Moist Heat;Cryotherapy;Electrical Stimulation    PT Next Visit Plan  balance and strength training    Consulted and Agree with Plan of Care  Patient       Patient will benefit from skilled therapeutic intervention in order to improve the following deficits and impairments:  Abnormal gait, Decreased balance, Decreased endurance, Decreased mobility, Difficulty walking, Impaired sensation, Impaired tone, Decreased activity tolerance, Decreased strength, Decreased safety awareness, Pain  Visit Diagnosis: Muscle weakness  Unsteadiness  Abnormality of gait     Problem List Patient Active Problem List   Diagnosis Date Noted  . Peripheral neuropathy 01/15/2018  . Left wrist pain 01/15/2018  . Rosacea   . Gait disturbance, post-stroke 10/05/2015  . Presbycusis of both ears   . Advanced care planning/counseling discussion 06/23/2015  . Impaired mobility 04/23/2015  . HTN (hypertension) 03/18/2015  . Chronic diastolic heart failure, NYHA class 1 ([place])  03/18/2015  . Spastic hemiparesis affecting dominant side ([place]) 07/07/2014  . Chest pressure 02/03/2014  . Medicare annual wellness visit, subsequent 11/06/2013  . Erectile dysfunction due to arterial insufficiency 02/05/2013  . MDD (major depressive disorder), recurrent episode, moderate ([place])   . Palpitations 06/26/2012  . HLD (hyperlipidemia)   . Restless leg syndrome   . GERD (gastroesophageal reflux disease)   . BPH with obstruction/lower urinary tract symptoms   . History of CVA (cerebrovascular accident) 03/11/2012  . Hemiparesis affecting right side as late effect of stroke  ([place]) 03/11/2012  . Type 2 diabetes, controlled, with neuropathy ([place]) 03/11/2012  . OSA on CPAP 03/11/2012  . CKD (chronic kidney disease) stage 3, GFR 30-59 ml/min (HCC) 03/11/2012  . Migraines 03/11/2012    Alanson Puls, PT DPT 02/14/2018, 11:30 AM  [place] MAIN [facility] SERVICES [place], [name], [address] Phone: [phone]   Fax:  [phone]  Name: ULYSES PANICO, MD MRN: [id number] Date of Birth: 1931-07-08

## 2018-03-08 ENCOUNTER — Encounter: Payer: Self-pay | Admitting: Physical Medicine & Rehabilitation

## 2018-03-08 ENCOUNTER — Ambulatory Visit (HOSPITAL_BASED_OUTPATIENT_CLINIC_OR_DEPARTMENT_OTHER): Payer: Medicare Other | Admitting: Physical Medicine & Rehabilitation

## 2018-03-08 ENCOUNTER — Other Ambulatory Visit: Payer: Self-pay

## 2018-03-08 ENCOUNTER — Encounter: Payer: Medicare Other | Attending: Physical Medicine & Rehabilitation

## 2018-03-08 VITALS — BP 116/62 | HR 71 | Ht 68.0 in | Wt 185.6 lb

## 2018-03-08 DIAGNOSIS — R252 Cramp and spasm: Secondary | ICD-10-CM | POA: Insufficient documentation

## 2018-03-08 DIAGNOSIS — G8110 Spastic hemiplegia affecting unspecified side: Secondary | ICD-10-CM

## 2018-03-08 DIAGNOSIS — M67912 Unspecified disorder of synovium and tendon, left shoulder: Secondary | ICD-10-CM

## 2018-03-08 NOTE — Progress Notes (Signed)
Subjective:    Patient ID: James Spurr, MD, male    DOB: 11-07-1930, 84 y.o.   MRN: [id number] Admitted March 11, 2012, with progressive  right-sided weakness. MRI revealed moderate-size acute infarct  affecting the left posterior putamen and left periventricular white  matter. MRA of the head was unremarkable. Carotid Dopplers with no ICA  stenosis. Echocardiogram with ejection fraction of 65% and grade 1  diastolic dysfunction. Neurology consulted, placed on Plavix therapy.  HPI   Patient continues have chronic right spastic hemiplegia which has been well managed with the following dose and muscle groups selection for botulinum toxin injection.  01/31/18 [id number] [id number]  FDS25  Lumbrical 1,2 and 3, 12.5 Units each=37.5  FDP with estim to Identify DIP flexion digits 2 and 3, 12.5 units injected Left shoulder pain with elevation, tries to keep activity below shoulder level  Right hand and forearm feel ok after Botox, pleased with results  In addition patient complains of left shoulder pain this is mainly with elevation at 90 degrees or higher.  He has not had any injuries to that side he has not had a history of surgery to that side.  He does note that he uses the left side very frequently because of his chronic right hemiplegia Pain Inventory Average Pain 0 Pain Right Now 0 My pain is no pain  In the last 24 hours, has pain interfered with the following? General activity 3 Relation with others 5 Enjoyment of life 4 What TIME of day is your pain at its worst? na Sleep (in general) Fair  Pain is worse with: no pain Pain improves with: no pain Relief from Meds: no pain  Mobility use a cane  Function retired  Neuro/Psych No problems in this area  Prior Studies Any changes since last visit?  no  Physicians involved in your care Any changes since last visit?  no   Family History  Problem Relation Age of Onset  . Stroke Father   . Lung cancer Father 53          smoker  . Stroke Brother   . Diabetes Brother 84  . Lung cancer Brother   . Alcoholism Sister   . Kidney disease Sister   . Coronary artery disease Neg Hx    Social History   Socioeconomic History  . Marital status: Unknown    Spouse name: Not on file  . Number of children: 2  . Years of education: MD  . Highest education level: Not on file  Occupational History  . Occupation: retired    Fish farm manager: RETIRED  Social Needs  . Financial resource strain: Not on file  . Food insecurity:    Worry: Not on file    Inability: Not on file  . Transportation needs:    Medical: Not on file    Non-medical: Not on file  Tobacco Use  . Smoking status: Never Smoker  . Smokeless tobacco: Never Used  . Tobacco comment: "used to smoke pipe when I was younger"  Substance and Sexual Activity  . Alcohol use: Yes    Alcohol/week: 2.4 oz    Types: 2 Glasses of wine, 2 Shots of liquor per week    Comment: occasional  . Drug use: No  . Sexual activity: Yes  Lifestyle  . Physical activity:    Days per week: Not on file    Minutes per session: Not on file  . Stress: Not on file  Relationships  . Social connections:  Talks on phone: Not on file    Gets together: Not on file    Attends religious service: Not on file    Active member of club or organization: Not on file    Attends meetings of clubs or organizations: Not on file    Relationship status: Not on file  Other Topics Concern  . Not on file  Social History Narrative   Caffeine: 1 cup coffee/day    Occupation: ophthalmologist, retired   Edu: MD   Activity: going to gym 3x/wk with trainer   Diet: some water, fruits/vegetables some, red meat a few times, fish 1x/wk   POA - [name] (Daughter)   Past Surgical History:  Procedure Laterality Date  . ABI  2007   WNL  . CARDIAC CATHETERIZATION  2004   normal; [place] [facility]  . CARDIOVASCULAR STRESS TEST  02/2014   WNL, low risk scan, EF 68%  . CAROTID ENDARTERECTOMY     . CATARACT EXTRACTION W/ INTRAOCULAR LENS IMPLANT  11//2012 - 08/2011  . COLONOSCOPY  03/2011   poor prep, unable to biopsy polyp in tic fold, rpt 3 mo [name])  . COLONOSCOPY  06/2011   mult polyps adenomatous, severe diverticulosis, rpt 3 yrs [name])  . CYSTOSCOPY WITH URETHRAL DILATATION N/A 07/24/2013   Procedure: CYSTOSCOPY WITH URETHRAL DILATATION;  Surgeon: [name], MD;  [facility];  Service: Urology;  Laterality: N/A;  . ESOPHAGOGASTRODUODENOSCOPY  2006   duodenitis, medual HH, Grade 2 reflux, mild gastritis  . ESOPHAGOGASTRODUODENOSCOPY  07/2011   mild chronic gastritis  . [place]  . [place]; 2002   left, then repaired  . MRI brain  04/2010   WNL  . PROSTATE SURGERY  02/2009   PVP (laser)  . SKIN CANCER EXCISION  2002   bridge of nose, BCC  . TONSILLECTOMY AND ADENOIDECTOMY  ~ 1945  . TRANSURETHRAL INCISION OF PROSTATE N/A 07/24/2013   Procedure: TRANSURETHRAL INCISION OF THE PROSTATE (TUIP);  Surgeon: [name], MD;  [facility];  Service: Urology;  Laterality: N/A;  . URETHRAL DILATION  2014   tannenbaum   Past Medical History:  Diagnosis Date  . Anemia   . Basal cell carcinoma 2018   R temple   . Basal cell carcinoma of nose 2002   bridge of nose  . BPH with obstruction/lower urinary tract symptoms    failed flomax, uroxatral, myrbetriq - sees urology [name]) pending videourodynamics  . CKD (chronic kidney disease) stage 3, GFR 30-59 ml/min (HCC) 03/11/2012   Renal US 02/2014 - multiple kidney cysts   . CVA (cerebral infarction) 03/07/12   slurred speech; right hemplegia, left handed - completed PT 07/2014 (17 sessions)  . Depression    mild, on cymbalta per prior PCP  . Erectile dysfunction    per urology (prostaglandin injection)  . GERD (gastroesophageal reflux disease)    with sliding HH  . H/O hiatal hernia   . History of kidney problems 12/2010   lacerated left kidney after fall  .  HLD (hyperlipidemia)   . Kidney cysts 02/2014   bilateral (renal US)  . Migraines 03/11/12   now rare  . OSA on CPAP    sleep study 01/2014 - severe, CPAP at 10, previously on nuvigil (Dr. [name])  . Palpitations 01/2012   holter WNL, rare PAC/PVCs  . Presbycusis of both ears 2016   rec amplification [facility])  . Restless leg syndrome  well controlled on mirapex  . Rosacea    Kowalski  . TIA (transient ischemic attack)   . Type 2 diabetes, controlled, with neuropathy ([place]) 2011   [name] in [place]   BP 116/62   Pulse 71   Ht 5\' 8"  (1.727 m) Comment: reported  Wt 185 lb 9.6 oz (84.2 kg)   SpO2 98%   BMI 28.22 kg/m   Opioid Risk Score:   Fall Risk Score:  `1  Depression screen PHQ 2/9  Depression screen [facility] 2/9 03/08/2018 12/14/2017 07/18/2017 07/17/2016 01/17/2016 10/05/2015 06/23/2015  Decreased Interest 0 0 0 0 0 0 0  Down, Depressed, Hopeless 1 1 1  0 0 0 0  PHQ - 2 Score 1 1 1  0 0 0 0  Altered sleeping - - 2 - - - -  Tired, decreased energy - - 2 - - - -  Change in appetite - - 1 - - - -  Feeling bad or failure about yourself  - - 1 - - - -  Trouble concentrating - - 1 - - - -  Moving slowly or fidgety/restless - - 0 - - - -  Suicidal thoughts - - 0 - - - -  PHQ-9 Score - - 8 - - - -  Some recent data might be hidden    Review of Systems  Constitutional: Negative.   HENT: Negative.   Eyes: Negative.   Respiratory: Negative.   Cardiovascular: Negative.   Gastrointestinal: Negative.   Endocrine: Negative.   Genitourinary: Negative.   Musculoskeletal: Negative.   Skin: Negative.   Allergic/Immunologic: Negative.   Neurological: Negative.   Hematological: Negative.   Psychiatric/Behavioral: Negative.   All other systems reviewed and are negative.      Objective:   Physical Exam  Constitutional: He is oriented to person, place, and time. He appears well-developed and well-nourished.  Neurological: He is alert and oriented to person, place, and time.  Skin:  Skin is warm and dry.  Psychiatric: He has a normal mood and affect.  Nursing note and vitals reviewed.    Motor strength is 3- in the right deltoid bicep tricep finger flexors and extensors Left upper extremity 5/5 in the deltoid bicep tricep grip Tone MAS 1 in the right finger flexors wrist flexors and elbow flexors. Musculoskeletal Left shoulder positive impingement at 90 degrees Negative drop arm test No limitation range of motion with internal or external rotation No tenderness palpation shoulder area no evidence of erythema.     Assessment & Plan:  #1.  Chronic right spastic hemiplegia following CVA in 2013.  He is on a stable dosage of Botox with stable technique.  Will switch to every 80-month visits.  We will repeat in approximately 2 months  2.  Left shoulder impingement syndrome.  We discussed exercises have printed out stretching and strengthening exercises for shoulder girdle musculature. We discussed possibility of left subacromial bursa injection.  He will call if he wants this performed.

## 2018-03-08 NOTE — Patient Instructions (Signed)
Shoulder Exercises Ask your health care provider which exercises are safe for you. Do exercises exactly as told by your health care provider and adjust them as directed. It is normal to feel mild stretching, pulling, tightness, or discomfort as you do these exercises, but you should stop right away if you feel sudden pain or your pain gets worse.Do not begin these exercises until told by your health care provider. RANGE OF MOTION EXERCISES These exercises warm up your muscles and joints and improve the movement and flexibility of your shoulder. These exercises also help to relieve pain, numbness, and tingling. These exercises involve stretching your injured shoulder directly. Exercise A: Pendulum  1. Stand near a wall or a surface that you can hold onto for balance. 2. Bend at the waist and let your left / right arm hang straight down. Use your other arm to support you. Keep your back straight and do not lock your knees. 3. Relax your left / right arm and shoulder muscles, and move your hips and your trunk so your left / right arm swings freely. Your arm should swing because of the motion of your body, not because you are using your arm or shoulder muscles. 4. Keep moving your body so your arm swings in the following directions, as told by your health care provider: ? Side to side. ? Forward and backward. ? In clockwise and counterclockwise circles. 5. Continue each motion for __________ seconds, or for as long as told by your health care provider. 6. Slowly return to the starting position. Repeat __________ times. Complete this exercise __________ times a day. Exercise B:Flexion, Standing  1. Stand and hold a broomstick, a cane, or a similar object. Place your hands a little more than shoulder-width apart on the object. Your left / right hand should be palm-up, and your other hand should be palm-down. 2. Keep your elbow straight and keep your shoulder muscles relaxed. Push the stick down with  your healthy arm to raise your left / right arm in front of your body, and then over your head until you feel a stretch in your shoulder. ? Avoid shrugging your shoulder while you raise your arm. Keep your shoulder blade tucked down toward the middle of your back. 3. Hold for __________ seconds. 4. Slowly return to the starting position. Repeat __________ times. Complete this exercise __________ times a day. Exercise C: Abduction, Standing 1. Stand and hold a broomstick, a cane, or a similar object. Place your hands a little more than shoulder-width apart on the object. Your left / right hand should be palm-up, and your other hand should be palm-down. 2. While keeping your elbow straight and your shoulder muscles relaxed, push the stick across your body toward your left / right side. Raise your left / right arm to the side of your body and then over your head until you feel a stretch in your shoulder. ? Do not raise your arm above shoulder height, unless your health care provider tells you to do that. ? Avoid shrugging your shoulder while you raise your arm. Keep your shoulder blade tucked down toward the middle of your back. 3. Hold for __________ seconds. 4. Slowly return to the starting position. Repeat __________ times. Complete this exercise __________ times a day. Exercise D:Internal Rotation  1. Place your left / right hand behind your back, palm-up. 2. Use your other hand to dangle an exercise band, a towel, or a similar object over your shoulder. Grasp the band with   your left / right hand so you are holding onto both ends. 3. Gently pull up on the band until you feel a stretch in the front of your left / right shoulder. ? Avoid shrugging your shoulder while you raise your arm. Keep your shoulder blade tucked down toward the middle of your back. 4. Hold for __________ seconds. 5. Release the stretch by letting go of the band and lowering your hands. Repeat __________ times. Complete  this exercise __________ times a day. STRETCHING EXERCISES These exercises warm up your muscles and joints and improve the movement and flexibility of your shoulder. These exercises also help to relieve pain, numbness, and tingling. These exercises are done using your healthy shoulder to help stretch the muscles of your injured shoulder. Exercise E: Corner Stretch (External Rotation and Abduction)  1. Stand in a doorway with one of your feet slightly in front of the other. This is called a staggered stance. If you cannot reach your forearms to the door frame, stand facing a corner of a room. 2. Choose one of the following positions as told by your health care provider: ? Place your hands and forearms on the door frame above your head. ? Place your hands and forearms on the door frame at the height of your head. ? Place your hands on the door frame at the height of your elbows. 3. Slowly move your weight onto your front foot until you feel a stretch across your chest and in the front of your shoulders. Keep your head and chest upright and keep your abdominal muscles tight. 4. Hold for __________ seconds. 5. To release the stretch, shift your weight to your back foot. Repeat __________ times. Complete this stretch __________ times a day. Exercise F:Extension, Standing 1. Stand and hold a broomstick, a cane, or a similar object behind your back. ? Your hands should be a little wider than shoulder-width apart. ? Your palms should face away from your back. 2. Keeping your elbows straight and keeping your shoulder muscles relaxed, move the stick away from your body until you feel a stretch in your shoulder. ? Avoid shrugging your shoulders while you move the stick. Keep your shoulder blade tucked down toward the middle of your back. 3. Hold for __________ seconds. 4. Slowly return to the starting position. Repeat __________ times. Complete this exercise __________ times a day. STRENGTHENING  EXERCISES These exercises build strength and endurance in your shoulder. Endurance is the ability to use your muscles for a long time, even after they get tired. Exercise G:External Rotation  1. Sit in a stable chair without armrests. 2. Secure an exercise band at elbow height on your left / right side. 3. Place a soft object, such as a folded towel or a small pillow, between your left / right upper arm and your body to move your elbow a few inches away (about 10 cm) from your side. 4. Hold the end of the band so it is tight and there is no slack. 5. Keeping your elbow pressed against the soft object, move your left / right forearm out, away from your abdomen. Keep your body steady so only your forearm moves. 6. Hold for __________ seconds. 7. Slowly return to the starting position. Repeat __________ times. Complete this exercise __________ times a day. Exercise H:Shoulder Abduction  1. Sit in a stable chair without armrests, or stand. 2. Hold a __________ weight in your left / right hand, or hold an exercise band with both hands.   3. Start with your arms straight down and your left / right palm facing in, toward your body. 4. Slowly lift your left / right hand out to your side. Do not lift your hand above shoulder height unless your health care provider tells you that this is safe. ? Keep your arms straight. ? Avoid shrugging your shoulder while you do this movement. Keep your shoulder blade tucked down toward the middle of your back. 5. Hold for __________ seconds. 6. Slowly lower your arm, and return to the starting position. Repeat __________ times. Complete this exercise __________ times a day. Exercise I:Shoulder Extension 1. Sit in a stable chair without armrests, or stand. 2. Secure an exercise band to a stable object in front of you where it is at shoulder height. 3. Hold one end of the exercise band in each hand. Your palms should face each other. 4. Straighten your elbows and  lift your hands up to shoulder height. 5. Step back, away from the secured end of the exercise band, until the band is tight and there is no slack. 6. Squeeze your shoulder blades together as you pull your hands down to the sides of your thighs. Stop when your hands are straight down by your sides. Do not let your hands go behind your body. 7. Hold for __________ seconds. 8. Slowly return to the starting position. Repeat __________ times. Complete this exercise __________ times a day. Exercise J:Standing Shoulder Row 1. Sit in a stable chair without armrests, or stand. 2. Secure an exercise band to a stable object in front of you so it is at waist height. 3. Hold one end of the exercise band in each hand. Your palms should be in a thumbs-up position. 4. Bend each of your elbows to an "L" shape (about 90 degrees) and keep your upper arms at your sides. 5. Step back until the band is tight and there is no slack. 6. Slowly pull your elbows back behind you. 7. Hold for __________ seconds. 8. Slowly return to the starting position. Repeat __________ times. Complete this exercise __________ times a day. Exercise K:Shoulder Press-Ups  1. Sit in a stable chair that has armrests. Sit upright, with your feet flat on the floor. 2. Put your hands on the armrests so your elbows are bent and your fingers are pointing forward. Your hands should be about even with the sides of your body. 3. Push down on the armrests and use your arms to lift yourself off of the chair. Straighten your elbows and lift yourself up as much as you comfortably can. ? Move your shoulder blades down, and avoid letting your shoulders move up toward your ears. ? Keep your feet on the ground. As you get stronger, your feet should support less of your body weight as you lift yourself up. 4. Hold for __________ seconds. 5. Slowly lower yourself back into the chair. Repeat __________ times. Complete this exercise __________ times a  day. Exercise L: Wall Push-Ups  1. Stand so you are facing a stable wall. Your feet should be about one arm-length away from the wall. 2. Lean forward and place your palms on the wall at shoulder height. 3. Keep your feet flat on the floor as you bend your elbows and lean forward toward the wall. 4. Hold for __________ seconds. 5. Straighten your elbows to push yourself back to the starting position. Repeat __________ times. Complete this exercise __________ times a day. This information is not intended to replace advice   given to you by your health care provider. Make sure you discuss any questions you have with your health care provider. Document Released: 07/19/2005 Document Revised: 05/29/2016 Document Reviewed: 05/16/2015 Elsevier Interactive Patient Education  2018 [employer]  

## 2018-03-20 ENCOUNTER — Other Ambulatory Visit: Payer: Self-pay | Admitting: Family Medicine

## 2018-03-20 NOTE — Telephone Encounter (Signed)
Plavix Last filled:  12/20/17, #90 Last OV:  01/15/18 Next OV:  07/18/18

## 2018-04-23 ENCOUNTER — Ambulatory Visit: Payer: Medicare Other | Admitting: Physical Medicine & Rehabilitation

## 2018-04-23 ENCOUNTER — Encounter: Payer: Medicare Other | Attending: Physical Medicine & Rehabilitation

## 2018-04-23 DIAGNOSIS — R252 Cramp and spasm: Secondary | ICD-10-CM | POA: Insufficient documentation

## 2018-05-10 ENCOUNTER — Telehealth: Payer: Self-pay

## 2018-05-10 NOTE — Telephone Encounter (Signed)
Pt returned call, he stated that he did initiate request for CPap supplies.

## 2018-05-10 NOTE — Telephone Encounter (Signed)
Received faxed [employer] Order from [facility] for what looks like CPAP supplies.  Left message for pt to call back.  Need to know that he initiated the request for the supplies.

## 2018-05-10 NOTE — Telephone Encounter (Signed)
Noted.   Placed form in Dr. Synthia Innocent box.

## 2018-05-13 NOTE — Telephone Encounter (Signed)
Signed and in my outbox.

## 2018-05-24 DIAGNOSIS — E1142 Type 2 diabetes mellitus with diabetic polyneuropathy: Secondary | ICD-10-CM | POA: Diagnosis not present

## 2018-05-24 DIAGNOSIS — E039 Hypothyroidism, unspecified: Secondary | ICD-10-CM | POA: Diagnosis not present

## 2018-05-24 DIAGNOSIS — E785 Hyperlipidemia, unspecified: Secondary | ICD-10-CM | POA: Diagnosis not present

## 2018-05-24 DIAGNOSIS — N183 Chronic kidney disease, stage 3 (moderate): Secondary | ICD-10-CM | POA: Diagnosis not present

## 2018-05-24 DIAGNOSIS — I129 Hypertensive chronic kidney disease with stage 1 through stage 4 chronic kidney disease, or unspecified chronic kidney disease: Secondary | ICD-10-CM | POA: Diagnosis not present

## 2018-05-24 DIAGNOSIS — G8191 Hemiplegia, unspecified affecting right dominant side: Secondary | ICD-10-CM | POA: Diagnosis not present

## 2018-05-24 DIAGNOSIS — E1121 Type 2 diabetes mellitus with diabetic nephropathy: Secondary | ICD-10-CM | POA: Diagnosis not present

## 2018-05-24 DIAGNOSIS — Z794 Long term (current) use of insulin: Secondary | ICD-10-CM | POA: Diagnosis not present

## 2018-05-24 DIAGNOSIS — F325 Major depressive disorder, single episode, in full remission: Secondary | ICD-10-CM | POA: Diagnosis not present

## 2018-05-24 DIAGNOSIS — Z79899 Other long term (current) drug therapy: Secondary | ICD-10-CM | POA: Diagnosis not present

## 2018-05-24 DIAGNOSIS — Z23 Encounter for immunization: Secondary | ICD-10-CM | POA: Diagnosis not present

## 2018-05-24 DIAGNOSIS — G4700 Insomnia, unspecified: Secondary | ICD-10-CM | POA: Diagnosis not present

## 2018-07-09 DIAGNOSIS — R1084 Generalized abdominal pain: Secondary | ICD-10-CM | POA: Diagnosis not present

## 2018-07-10 DIAGNOSIS — L539 Erythematous condition, unspecified: Secondary | ICD-10-CM | POA: Diagnosis not present

## 2018-07-10 DIAGNOSIS — L821 Other seborrheic keratosis: Secondary | ICD-10-CM | POA: Diagnosis not present

## 2018-07-10 DIAGNOSIS — L812 Freckles: Secondary | ICD-10-CM | POA: Diagnosis not present

## 2018-07-10 DIAGNOSIS — D1800 Hemangioma unspecified site: Secondary | ICD-10-CM | POA: Diagnosis not present

## 2018-07-10 DIAGNOSIS — Z1283 Encounter for screening for malignant neoplasm of skin: Secondary | ICD-10-CM | POA: Diagnosis not present

## 2018-07-10 DIAGNOSIS — Z85828 Personal history of other malignant neoplasm of skin: Secondary | ICD-10-CM | POA: Diagnosis not present

## 2018-07-10 DIAGNOSIS — L570 Actinic keratosis: Secondary | ICD-10-CM | POA: Diagnosis not present

## 2018-07-10 DIAGNOSIS — L820 Inflamed seborrheic keratosis: Secondary | ICD-10-CM | POA: Diagnosis not present

## 2018-07-12 ENCOUNTER — Encounter: Payer: Self-pay | Admitting: Family Medicine

## 2018-07-12 ENCOUNTER — Ambulatory Visit (INDEPENDENT_AMBULATORY_CARE_PROVIDER_SITE_OTHER): Payer: Medicare Other | Admitting: Family Medicine

## 2018-07-12 VITALS — BP 120/68 | HR 71 | Temp 97.8°F | Ht 68.0 in | Wt 187.2 lb

## 2018-07-12 DIAGNOSIS — G6289 Other specified polyneuropathies: Secondary | ICD-10-CM

## 2018-07-12 DIAGNOSIS — I10 Essential (primary) hypertension: Secondary | ICD-10-CM

## 2018-07-12 DIAGNOSIS — G2581 Restless legs syndrome: Secondary | ICD-10-CM

## 2018-07-12 DIAGNOSIS — E1140 Type 2 diabetes mellitus with diabetic neuropathy, unspecified: Secondary | ICD-10-CM

## 2018-07-12 DIAGNOSIS — G8110 Spastic hemiplegia affecting unspecified side: Secondary | ICD-10-CM | POA: Diagnosis not present

## 2018-07-12 DIAGNOSIS — I69351 Hemiplegia and hemiparesis following cerebral infarction affecting right dominant side: Secondary | ICD-10-CM | POA: Diagnosis not present

## 2018-07-12 LAB — POCT GLYCOSYLATED HEMOGLOBIN (HGB A1C): Hemoglobin A1C: 5.8 % — AB (ref 4.0–5.6)

## 2018-07-12 MED ORDER — PRAMIPEXOLE DIHYDROCHLORIDE 1 MG PO TABS: ORAL_TABLET | ORAL | 1 refills | Status: DC

## 2018-07-12 NOTE — Assessment & Plan Note (Signed)
Now on mirapex 2mg  BID - if lyrica effective for neuropathy pain, recommended return to mirapex dosing nightly.

## 2018-07-12 NOTE — Assessment & Plan Note (Signed)
He is now on lyrica in place of gabapentin - advised give this more time. Using capsaicin cream PRN

## 2018-07-12 NOTE — Progress Notes (Signed)
BP 120/68 (BP Location: Left Arm, Patient Position: Sitting, Cuff Size: Normal)   Pulse 71   Temp 97.8 F (36.6 C) (Oral)   Ht 5\' 8"  (1.727 m)   Wt 187 lb 4 oz (84.9 kg)   SpO2 99%   BMI 28.47 kg/m    CC: 6 mo f/u visit Subjective:    Patient ID: James Spurr, MD, male    DOB: 02-23-1931, 84 y.o.   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 )   RLS - taking mirapex 1mg  2 tab BID. 1 month ago started taking lyrica 75mg  bid for persistent neuropathy. He has tapered off gabapentin. Intermittently uses capsaicin cream at night time.   Following weight watcher diet. 7 lb weight loss in the past 6 months.  DM - does regularly check sugars - some low sugars. This is followed by [name]. Compliant with antihyperglycemic regimen which includes: tresiba has decreased to 60u, ozempic 0.5mg  weekly. Few low sugars. Ongoing neuropathy.  Lab Results  Component Value Date   HGBA1C 5.8 (A) 07/12/2018   Diabetic Foot Exam - Simple   No data filed     Lab Results  Component Value Date   MICROALBUR 2.0 (H) 04/21/2013     Relevant past medical, surgical, family and social history reviewed and updated as indicated. Interim medical history since our last visit reviewed. Allergies and medications reviewed and updated. Outpatient Medications Prior to Visit  Medication Sig Dispense Refill  . capsaicin (ZOSTRIX) 0.025 % cream Apply topically 2 (two) times daily. 60 g 0  . clopidogrel (PLAVIX) 75 MG tablet TAKE 1 TABLET DAILY 90 tablet 1  . Coenzyme Q10 (COQ10) 150 MG CAPS Take 1 capsule by mouth 2 (two) times daily.    . CRESTOR 40 MG tablet TAKE 1 TABLET DAILY 90 tablet 1  . diazepam (VALIUM) 5 MG tablet Take 1 tablet (5 mg total) by mouth every 8 (eight) hours as needed for muscle spasms. 90 tablet 0  . diclofenac sodium (VOLTAREN) 1 % GEL APPLY 2 GRAMS TOPICALLY FOUR TIMES A  DAY 300 g 1  . DULoxetine (CYMBALTA) 60 MG capsule TAKE 1 CAPSULE DAILY 90 capsule 1  . FOLBEE 2.5-25-1 MG TABS tablet TAKE 1 TABLET DAILY 90 tablet 1  . gabapentin (NEURONTIN) 300 MG capsule Take 1 in the morning and 2 at night. 270 capsule 1  . ibuprofen (ADVIL,MOTRIN) 200 MG tablet Take 400 mg by mouth every 6 (six) hours as needed for mild pain or moderate pain.    . Insulin Degludec (TRESIBA FLEXTOUCH) 200 UNIT/ML SOPN Inject 60 Units into the skin at bedtime.     . Insulin Pen Needle (NOVOFINE) 30G X 8 MM MISC Use to inject insulin once daily dx:E11.40 300 each 3  . levothyroxine (LEVOTHROID) 25 MCG tablet Take 1 tablet (25 mcg total) by mouth daily before breakfast. Per endo [name]    . LYRICA 75 MG capsule Take 1 capsule by mouth 2 (two) times daily.    . metoprolol tartrate (LOPRESSOR) 25 MG tablet TAKE ONE-HALF (1/2) TABLET TWICE A DAY 90 tablet 0  . metroNIDAZOLE (METROCREAM) 0.75 % cream Apply 1 application topically 3 (three) times daily.    . nitroGLYCERIN (NITROSTAT) 0.4 MG SL tablet DISSOLVE 1 TABLET UNDER THE TONGUE EVERY 5 MINUTES AS NEEDED FOR CHEST PAIN 25 tablet 1  .  omega-3 acid ethyl esters (LOVAZA) 1 g capsule Take 2 capsules (2 g total) by mouth 2 (two) times daily. 360 capsule 1  . omeprazole (PRILOSEC) 40 MG capsule TAKE 1 CAPSULE DAILY 90 capsule 0  . Semaglutide (OZEMPIC) 0.25 or 0.5 MG/DOSE SOPN Inject into the skin.    Marland Kitchen traZODone (DESYREL) 50 MG tablet Take 50 mg by mouth at bedtime as needed for sleep.    . valsartan (DIOVAN) 40 MG tablet Take 1 tablet (40 mg total) by mouth daily. 90 tablet 1  . vitamin C (ASCORBIC ACID) 500 MG tablet Take 1,000 mg by mouth daily.     . pramipexole (MIRAPEX) 1 MG tablet TAKE 2 TABLETS AT 3 PM, EXTRA TABLET AT BEDTIME AS NEEDED (Patient taking differently: TAKES 2 TABLETS AT 3 PM, 2 TABLETS AT BEDTIME AS NEEDED) 270 tablet 1   No facility-administered medications prior to visit.      Per HPI unless specifically indicated in  ROS section below Review of Systems     Objective:    BP 120/68 (BP Location: Left Arm, Patient Position: Sitting, Cuff Size: Normal)   Pulse 71   Temp 97.8 F (36.6 C) (Oral)   Ht 5\' 8"  (1.727 m)   Wt 187 lb 4 oz (84.9 kg)   SpO2 99%   BMI 28.47 kg/m   Wt Readings from Last 3 Encounters:  07/12/18 187 lb 4 oz (84.9 kg)  03/08/18 185 lb 9.6 oz (84.2 kg)  01/31/18 191 lb (86.6 kg)    Physical Exam  Constitutional: He appears well-developed and well-nourished. No distress.  HENT:  Mouth/Throat: Oropharynx is clear and moist. No oropharyngeal exudate.  Eyes: Pupils are equal, round, and reactive to light. EOM are normal.  Cardiovascular: Normal rate, regular rhythm and normal heart sounds.  No murmur heard. Pulmonary/Chest: Effort normal and breath sounds normal. No respiratory distress. He has no wheezes. He has no rales.  Crackles bibasilarly clear some with coughing  Musculoskeletal: He exhibits no edema.  Psychiatric: He has a normal mood and affect.  Nursing note and vitals reviewed.  Results for orders placed or performed in visit on 07/12/18  POCT glycosylated hemoglobin (Hb A1C)  Result Value Ref Range   Hemoglobin A1C 5.8 (A) 4.0 - 5.6 %   HbA1c POC (<> result, manual entry)     HbA1c, POC (prediabetic range)     HbA1c, POC (controlled diabetic range)        Assessment & Plan:   Problem List Items Addressed This Visit    Type 2 diabetes, controlled, with neuropathy ([place]) - Primary    Chronic. Great A1c control. However having some lows - recommended he touch base with his endocrinologist about tresiba dosing.      Relevant Orders   POCT glycosylated hemoglobin (Hb A1C) (Completed)   Spastic hemiparesis affecting dominant side ([facility])   Restless leg syndrome    Now on mirapex 2mg  BID - if lyrica effective for neuropathy pain, recommended return to mirapex dosing nightly.      Peripheral neuropathy    He is now on lyrica in place of gabapentin - advised give  this more time. Using capsaicin cream PRN      Relevant Medications   LYRICA 75 MG capsule   pramipexole (MIRAPEX) 1 MG tablet   HTN (hypertension)    Chronic, stable. Continue current regimen.       Hemiparesis affecting right side as late effect of stroke ([place])  Meds ordered this encounter  Medications  . pramipexole (MIRAPEX) 1 MG tablet    Sig: TAKES 2 TABLETS AT 3 PM, 2 TABLETS AT BEDTIME AS NEEDED    Dispense:  360 tablet    Refill:  1   Orders Placed This Encounter  Procedures  . POCT glycosylated hemoglobin (Hb A1C)    Follow up plan: Return in about 6 months (around 01/11/2019) for medicare wellness visit, follow up visit.  [name], MD

## 2018-07-12 NOTE — Patient Instructions (Addendum)
You are doing well today Trial lyrica for neuropathy pain in place of gabapentin. If helpful, may return to just nightly mirapex dosing.  Congratulations on weight watchers success - touch base with [name] about insulin dosing to avoid low sugars.  Return in 4-6 months for wellness visit.

## 2018-07-12 NOTE — Assessment & Plan Note (Signed)
Chronic, stable. Continue current regimen. 

## 2018-07-12 NOTE — Assessment & Plan Note (Signed)
Chronic. Great A1c control. However having some lows - recommended he touch base with his endocrinologist about tresiba dosing.

## 2018-07-17 ENCOUNTER — Ambulatory Visit: Payer: Medicare Other | Admitting: Family Medicine

## 2018-07-18 ENCOUNTER — Ambulatory Visit: Payer: Medicare Other | Admitting: Family Medicine

## 2018-08-27 ENCOUNTER — Other Ambulatory Visit: Payer: Self-pay | Admitting: Family Medicine

## 2018-08-28 DIAGNOSIS — M79609 Pain in unspecified limb: Secondary | ICD-10-CM | POA: Diagnosis not present

## 2018-10-21 ENCOUNTER — Other Ambulatory Visit: Payer: Self-pay | Admitting: Geriatric Medicine

## 2018-10-21 DIAGNOSIS — H539 Unspecified visual disturbance: Secondary | ICD-10-CM | POA: Diagnosis not present

## 2018-10-21 DIAGNOSIS — G4452 New daily persistent headache (NDPH): Secondary | ICD-10-CM

## 2018-10-21 DIAGNOSIS — R29898 Other symptoms and signs involving the musculoskeletal system: Secondary | ICD-10-CM | POA: Diagnosis not present

## 2018-10-21 DIAGNOSIS — Z79899 Other long term (current) drug therapy: Secondary | ICD-10-CM | POA: Diagnosis not present

## 2018-10-22 DIAGNOSIS — H40001 Preglaucoma, unspecified, right eye: Secondary | ICD-10-CM | POA: Diagnosis not present

## 2018-10-22 LAB — HM DIABETES EYE EXAM

## 2018-10-24 ENCOUNTER — Encounter: Payer: Self-pay | Admitting: Family Medicine

## 2018-10-26 ENCOUNTER — Ambulatory Visit
Admission: RE | Admit: 2018-10-26 | Discharge: 2018-10-26 | Disposition: A | Discharge status: Home / Self Care | Payer: Medicare Other | Source: Ambulatory Visit | Attending: Geriatric Medicine | Admitting: Geriatric Medicine

## 2018-10-26 DIAGNOSIS — G4452 New daily persistent headache (NDPH): Secondary | ICD-10-CM | POA: Diagnosis not present

## 2018-10-26 MED ORDER — GADOBENATE DIMEGLUMINE 529 MG/ML IV SOLN
17.0000 mL | Freq: Once | INTRAVENOUS | Status: AC | PRN
  Administered 2018-10-26: 17 mL via INTRAVENOUS

## 2018-10-29 ENCOUNTER — Encounter: Payer: Self-pay | Admitting: Neurology

## 2018-10-30 ENCOUNTER — Ambulatory Visit (INDEPENDENT_AMBULATORY_CARE_PROVIDER_SITE_OTHER): Payer: Medicare Other | Admitting: Family Medicine

## 2018-10-30 ENCOUNTER — Encounter: Payer: Self-pay | Admitting: Family Medicine

## 2018-10-30 VITALS — BP 136/70 | HR 79 | Temp 98.4°F | Ht 68.0 in | Wt 190.1 lb

## 2018-10-30 DIAGNOSIS — Z9989 Dependence on other enabling machines and devices: Secondary | ICD-10-CM | POA: Diagnosis not present

## 2018-10-30 DIAGNOSIS — I69351 Hemiplegia and hemiparesis following cerebral infarction affecting right dominant side: Secondary | ICD-10-CM | POA: Diagnosis not present

## 2018-10-30 DIAGNOSIS — E1140 Type 2 diabetes mellitus with diabetic neuropathy, unspecified: Secondary | ICD-10-CM | POA: Diagnosis not present

## 2018-10-30 DIAGNOSIS — E785 Hyperlipidemia, unspecified: Secondary | ICD-10-CM

## 2018-10-30 DIAGNOSIS — I10 Essential (primary) hypertension: Secondary | ICD-10-CM

## 2018-10-30 DIAGNOSIS — N183 Chronic kidney disease, stage 3 unspecified: Secondary | ICD-10-CM

## 2018-10-30 DIAGNOSIS — G6289 Other specified polyneuropathies: Secondary | ICD-10-CM | POA: Diagnosis not present

## 2018-10-30 DIAGNOSIS — Z8673 Personal history of transient ischemic attack (TIA), and cerebral infarction without residual deficits: Secondary | ICD-10-CM

## 2018-10-30 DIAGNOSIS — G4733 Obstructive sleep apnea (adult) (pediatric): Secondary | ICD-10-CM

## 2018-10-30 DIAGNOSIS — R519 Headache, unspecified: Secondary | ICD-10-CM

## 2018-10-30 DIAGNOSIS — R51 Headache: Secondary | ICD-10-CM | POA: Diagnosis not present

## 2018-10-30 LAB — BASIC METABOLIC PANEL
BUN: 26 mg/dL — ABNORMAL HIGH (ref 6–23)
CO2: 27 mEq/L (ref 19–32)
Calcium: 9.3 mg/dL (ref 8.4–10.5)
Chloride: 102 mEq/L (ref 96–112)
Creatinine, Ser: 1.4 mg/dL (ref 0.40–1.50)
GFR: 48.13 mL/min — ABNORMAL LOW (ref >60.00)
Glucose, Bld: 74 mg/dL (ref 70–99)
Potassium: 4 mEq/L (ref 3.5–5.1)
Sodium: 137 mEq/L (ref 135–145)

## 2018-10-30 LAB — CBC WITH DIFFERENTIAL/PLATELET
Basophils Absolute: 0 10*3/uL (ref 0.0–0.1)
Basophils Relative: 0.5 % (ref 0.0–3.0)
Eosinophils Absolute: 0.1 10*3/uL (ref 0.0–0.7)
Eosinophils Relative: 1.4 % (ref 0.0–5.0)
HCT: 40.8 % (ref 39.0–52.0)
Hemoglobin: 13.8 g/dL (ref 13.0–17.0)
Lymphocytes Relative: 16 % (ref 12.0–46.0)
Lymphs Abs: 1.3 10*3/uL (ref 0.7–4.0)
MCHC: 33.8 g/dL (ref 30.0–36.0)
MCV: 90.2 fl (ref 78.0–100.0)
Monocytes Absolute: 0.7 10*3/uL (ref 0.1–1.0)
Monocytes Relative: 8.7 % (ref 3.0–12.0)
Neutro Abs: 6 10*3/uL (ref 1.4–7.7)
Neutrophils Relative %: 73.4 % (ref 43.0–77.0)
Platelets: 171 10*3/uL (ref 150.0–400.0)
RBC: 4.53 Mil/uL (ref 4.22–5.81)
RDW: 13.6 % (ref 11.5–15.5)
WBC: 8.1 10*3/uL (ref 4.0–10.5)

## 2018-10-30 LAB — LDL CHOLESTEROL, DIRECT: Direct LDL: 78 mg/dL

## 2018-10-30 LAB — TSH: TSH: 4.37 u[IU]/mL (ref 0.35–4.50)

## 2018-10-30 LAB — HEMOGLOBIN A1C: Hgb A1c MFr Bld: 6.6 % — ABNORMAL HIGH (ref 4.6–6.5)

## 2018-10-30 LAB — T4, FREE: Free T4: 0.75 ng/dL (ref 0.60–1.60)

## 2018-10-30 MED ORDER — METOPROLOL TARTRATE 25 MG PO TABS: 12.5000 mg | ORAL_TABLET | Freq: Every day | ORAL | 1 refills | Status: DC

## 2018-10-30 NOTE — Progress Notes (Signed)
BP 136/70 (BP Location: Left Arm, Patient Position: Sitting, Cuff Size: Normal)   Pulse 79   Temp 98.4 F (36.9 C) (Oral)   Ht '5\' 8"'$  (1.727 m)   Wt 190 lb 2 oz (86.2 kg)   SpO2 98%   BMI 28.91 kg/m    CC: headache Subjective:    Patient ID: James Spurr, MD, male    DOB: Nov 12, 1930, 85 y.o.   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  Feels like head is "in a jar". Also, c/o double vision. Sxs started about 2 mos ago. )   2 mo h/o headaches upon awakening described as dull ache diffusely and central (they do not wake him up). Head feels like it's "in a jar" most days. Easily falls asleep watching TV - uses CPAP regularly for OSA along with mirapex 2 tablets bid (RLS). Notes double vision that he is able to correct by looking outwards. Notes more difficulty with close reading. Notes increased weakness on right (3 falls backwards in the last few weeks) - same side as residual hemiplegia after initial stroke. Staying nauseated.   Takes aleve '440mg'$  PRN for headaches but not regularly (about once a month). Tylenol doesn't help.  He did have MRI without contrast on Saturday (ordered by Dr Felipa Eth internist at [facility]) - no acute change (see below). He also had ESR checked last week - normal.  He states has had reassuring ophthalmology exam.  Last saw Dr Rexene Alberts for OSA 10/2017. Upcoming appt with her on Monday.   Notes tachycardiac that wakes him up at 3-5am if he doesn't take beta blocker. Normally takes metoprolol 12.'5mg'$  bid.    H/o CVA 02/2012 presented with slurred speech with residual R hemiplegia.  H/o TIAs.  Left handed.   On plavix and crestor daily.  On lyrica '75mg'$  BID - this was changed 3 months ago.   Checks sugars daily. Denies low sugars. On tresiba 60u and ozempic 0.'5mg'$  weekly.      Relevant past medical, surgical, family and social history reviewed and updated as indicated. Interim  medical history since our last visit reviewed. Allergies and medications reviewed and updated. Outpatient Medications Prior to Visit  Medication Sig Dispense Refill  . capsaicin (ZOSTRIX) 0.025 % cream Apply topically 2 (two) times daily. 60 g 0  . clopidogrel (PLAVIX) 75 MG tablet TAKE 1 TABLET DAILY 90 tablet 1  . Coenzyme Q10 (COQ10) 150 MG CAPS Take 1 capsule by mouth 2 (two) times daily.    . CRESTOR 40 MG tablet TAKE 1 TABLET DAILY 90 tablet 1  . diazepam (VALIUM) 5 MG tablet Take 1 tablet (5 mg total) by mouth every 8 (eight) hours as needed for muscle spasms. 90 tablet 0  . diclofenac sodium (VOLTAREN) 1 % GEL APPLY 2 GRAMS TOPICALLY FOUR TIMES A DAY 300 g 1  . DULoxetine (CYMBALTA) 60 MG capsule TAKE 1 CAPSULE DAILY 90 capsule 1  . FOLBEE 2.5-25-1 MG TABS tablet TAKE 1 TABLET DAILY 90 tablet 1  . ibuprofen (ADVIL,MOTRIN) 200 MG tablet Take 400 mg by mouth every 6 (six) hours as needed for mild pain or moderate pain.    . Insulin Degludec (TRESIBA FLEXTOUCH) 200 UNIT/ML SOPN Inject 60 Units into the skin at bedtime.     . Insulin Pen Needle (NOVOFINE) 30G X 8 MM MISC Use to inject insulin once daily dx:E11.40 300 each 3  .  levothyroxine (LEVOTHROID) 25 MCG tablet Take 1 tablet (25 mcg total) by mouth daily before breakfast. Per endo [name]    . LYRICA 75 MG capsule Take 1 capsule by mouth 2 (two) times daily.    . metroNIDAZOLE (METROCREAM) 0.75 % cream Apply 1 application topically 3 (three) times daily.    . nitroGLYCERIN (NITROSTAT) 0.4 MG SL tablet DISSOLVE 1 TABLET UNDER THE TONGUE EVERY 5 MINUTES AS NEEDED FOR CHEST PAIN 25 tablet 1  . omega-3 acid ethyl esters (LOVAZA) 1 g capsule Take 2 capsules (2 g total) by mouth 2 (two) times daily. 360 capsule 1  . omeprazole (PRILOSEC) 40 MG capsule TAKE 1 CAPSULE DAILY 90 capsule 0  . pramipexole (MIRAPEX) 1 MG tablet TAKES 2 TABLETS AT 3 PM, 2 TABLETS AT BEDTIME AS NEEDED 360 tablet 1  . Semaglutide (OZEMPIC) 0.25 or 0.5 MG/DOSE SOPN  Inject into the skin.    Marland Kitchen traZODone (DESYREL) 50 MG tablet Take 50 mg by mouth at bedtime as needed for sleep.    . valsartan (DIOVAN) 40 MG tablet Take 1 tablet (40 mg total) by mouth daily. 90 tablet 1  . vitamin C (ASCORBIC ACID) 500 MG tablet Take 1,000 mg by mouth daily.     . metoprolol tartrate (LOPRESSOR) 25 MG tablet TAKE ONE-HALF (1/2) TABLET TWICE A DAY 90 tablet 1  . gabapentin (NEURONTIN) 300 MG capsule Take 1 in the morning and 2 at night. 270 capsule 1   No facility-administered medications prior to visit.      Per HPI unless specifically indicated in ROS section below Review of Systems Objective:    BP 136/70 (BP Location: Left Arm, Patient Position: Sitting, Cuff Size: Normal)   Pulse 79   Temp 98.4 F (36.9 C) (Oral)   Ht '5\' 8"'$  (1.727 m)   Wt 190 lb 2 oz (86.2 kg)   SpO2 98%   BMI 28.91 kg/m   Wt Readings from Last 3 Encounters:  10/30/18 190 lb 2 oz (86.2 kg)  07/12/18 187 lb 4 oz (84.9 kg)  03/08/18 185 lb 9.6 oz (84.2 kg)    Physical Exam Vitals signs and nursing note reviewed.  Constitutional:      Appearance: Normal appearance. He is well-developed.  HENT:     Head: Normocephalic and atraumatic.     Mouth/Throat:     Mouth: Mucous membranes are moist.     Pharynx: Oropharynx is clear.  Eyes:     Extraocular Movements: Extraocular movements intact.     Conjunctiva/sclera: Conjunctivae normal.     Pupils: Pupils are equal, round, and reactive to light.  Neck:     Vascular: No carotid bruit.  Cardiovascular:     Rate and Rhythm: Normal rate and regular rhythm.     Pulses: Normal pulses.     Heart sounds: Normal heart sounds. No murmur.  Pulmonary:     Effort: Pulmonary effort is normal. No respiratory distress.     Breath sounds: Normal breath sounds. No wheezing, rhonchi or rales.  Skin:    General: Skin is warm and dry.  Neurological:     General: No focal deficit present.     Mental Status: He is alert.     Cranial Nerves: No dysarthria.       Sensory: Sensation is intact.     Motor: Weakness (chronic R sided) present.     Coordination: Finger-Nose-Finger Test abnormal (chronic).     Comments: CN 2-12 intact, hearing aides in place EOMI  FTN testing delayed on right (chronic) 4/5 strength RUE, 5/5 strength LUE (chronic) Ambulates with assistive device  Psychiatric:        Mood and Affect: Mood normal.       Results for orders placed or performed in visit on 10/24/18  HM DIABETES EYE EXAM  Result Value Ref Range   HM Diabetic Eye Exam No Retinopathy No Retinopathy   Lab Results  Component Value Date   HGBA1C 5.8 (A) 07/12/2018    Lab Results  Component Value Date   TSH 3.98 07/18/2017   MRI HEAD WITHOUT AND WITH CONTRAST IMPRESSION: 1. No acute intracranial abnormality. Stable MRI appearance of the brain since April 2019. 2. Chronic ischemia in the left corona radiata and lentiform with Wallerian degeneration. Electronically Signed   By: Genevie Ann M.D.   On: 10/26/2018 14:27 Assessment & Plan:  DM and thyroid managed by [name]. Problem List Items Addressed This Visit    Type 2 diabetes, controlled, with neuropathy ([place])    Unsure when this was last checked - will update A1c today. I have requested [facility] records today. On tresiba and ozempic.      Relevant Orders   Hemoglobin A1c   Peripheral neuropathy    Gabapentin changed to lyrica late last month to combat neuropathic pain. I encouraged he touch base with PMR about possible relation of lyrica to headaches and increased fatigue.       Relevant Orders   TSH   T4, free   OSA on CPAP    Reports compliance with CPAP and mirapex. Has f/u with Dr Rexene Alberts next week.       HTN (hypertension)    Has not been regularly using metoprolol. Now noticing tachycardia that awakens him at night - recommended he schedule nightly metoprolol 12.'5mg'$  with second dose in am if needed.       Relevant Medications   metoprolol tartrate (LOPRESSOR) 25 MG tablet   Other  Relevant Orders   TSH   HLD (hyperlipidemia)    Update dLDL on crestor.       Relevant Medications   metoprolol tartrate (LOPRESSOR) 25 MG tablet   Other Relevant Orders   LDL Cholesterol, Direct   History of CVA (cerebrovascular accident)   Hemiparesis affecting right side as late effect of stroke ([place])   Headache - Primary    With double vision, of unclear cause as of yet. Reassuringly stable neurological exam today. Recent MRI reassuring. This could be med related (lyrica) - I did ask him to contact PMR to ask about switch back to gabapentin. Will check labwork today including TSH. If persistent headache, will refer to neurology in h/o CVA 2013 and TIAs.  I have requested [facility] records today to review workup to date.       Relevant Medications   metoprolol tartrate (LOPRESSOR) 25 MG tablet   Other Relevant Orders   TSH   T4, free   CKD (chronic kidney disease) stage 3, GFR 30-59 ml/min (HCC)   Relevant Orders   Basic metabolic panel   CBC with Differential/Platelet       Meds ordered this encounter  Medications  . metoprolol tartrate (LOPRESSOR) 25 MG tablet    Sig: Take 0.5 tablets (12.5 mg total) by mouth at bedtime. With AM dose if needed    Dispense:  90 tablet    Refill:  1   Orders Placed This Encounter  Procedures  . TSH  . T4, free  . Hemoglobin A1c  .  Basic metabolic panel  . CBC with Differential/Platelet  . LDL Cholesterol, Direct    Patient Instructions  Labs today.  Sign release for records from Dr Felipa Eth.  Take metoprolol 12.'5mg'$  every night at bedtime, with option to take second dose in the morning.  Touch base with Dr Letta Pate about returning to gabapentin in place of lyrica.  If ongoing symptoms despite above changes, let me know for neurology referral.  Return in 1 month for follow up visit.   Follow up plan: Return in about 4 weeks (around 11/27/2018) for follow up visit.  [name], MD

## 2018-10-30 NOTE — Assessment & Plan Note (Signed)
Reports compliance with CPAP and mirapex. Has f/u with Dr Rexene Alberts next week.

## 2018-10-30 NOTE — Assessment & Plan Note (Addendum)
Update dLDL on crestor.

## 2018-10-30 NOTE — Assessment & Plan Note (Addendum)
With double vision, of unclear cause as of yet. Reassuringly stable neurological exam today. Recent MRI reassuring. This could be med related (lyrica) - I did ask him to contact PMR to ask about switch back to gabapentin. Will check labwork today including TSH. If persistent headache, will refer to neurology in h/o CVA 2013 and TIAs.  I have requested [facility] records today to review workup to date.

## 2018-10-30 NOTE — Assessment & Plan Note (Signed)
Has not been regularly using metoprolol. Now noticing tachycardia that awakens him at night - recommended he schedule nightly metoprolol 12.5mg  with second dose in am if needed.

## 2018-10-30 NOTE — Patient Instructions (Addendum)
Labs today.  Sign release for records from Dr Felipa Eth.  Take metoprolol 12.5mg  every night at bedtime, with option to take second dose in the morning.  Touch base with Dr Letta Pate about returning to gabapentin in place of lyrica.  If ongoing symptoms despite above changes, let me know for neurology referral.  Return in 1 month for follow up visit.

## 2018-10-30 NOTE — Assessment & Plan Note (Addendum)
Unsure when this was last checked - will update A1c today. I have requested [facility] records today. On tresiba and ozempic.

## 2018-10-30 NOTE — Assessment & Plan Note (Signed)
Gabapentin changed to lyrica late last month to combat neuropathic pain. I encouraged he touch base with PMR about possible relation of lyrica to headaches and increased fatigue.

## 2018-10-31 ENCOUNTER — Encounter: Payer: Self-pay | Admitting: Neurology

## 2018-11-04 ENCOUNTER — Encounter: Payer: Self-pay | Admitting: Neurology

## 2018-11-04 ENCOUNTER — Ambulatory Visit (INDEPENDENT_AMBULATORY_CARE_PROVIDER_SITE_OTHER): Payer: Medicare Other | Admitting: Neurology

## 2018-11-04 VITALS — BP 120/65 | HR 71 | Ht 68.0 in | Wt 190.0 lb

## 2018-11-04 DIAGNOSIS — Z9989 Dependence on other enabling machines and devices: Secondary | ICD-10-CM | POA: Diagnosis not present

## 2018-11-04 DIAGNOSIS — G4733 Obstructive sleep apnea (adult) (pediatric): Secondary | ICD-10-CM | POA: Diagnosis not present

## 2018-11-04 NOTE — Patient Instructions (Signed)
Please continue using your CPAP regularly. While your insurance requires that you use CPAP at least 4 hours each night on 70% of the nights, I recommend, that you not skip any nights and use it throughout the night if you can. Getting used to CPAP and staying with the treatment long term does take time and patience and discipline. Untreated obstructive sleep apnea when it is moderate to severe can have an adverse impact on cardiovascular health and raise her risk for heart disease, arrhythmias, hypertension, congestive heart failure, stroke and diabetes. Untreated obstructive sleep apnea causes sleep disruption, nonrestorative sleep, and sleep deprivation. This can have an impact on your day to day functioning and cause daytime sleepiness and impairment of cognitive function, memory loss, mood disturbance, and problems focussing. Using CPAP regularly can improve these symptoms.  Keep up the good work! We can see you in 1 year, you can see one of our nurse practitioners as you are stable, Ward Givens, NP.

## 2018-11-04 NOTE — Progress Notes (Signed)
Subjective:    Patient ID: James Spurr, MD is a 85 y.o. male.  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 The patient is unaccompanied today and presents for his yearly checkup. I last saw him on 10/31/2017, at which time he was compliant with his CPAP. He felt stable. Botox was effective.  Today, 11/04/2018: I reviewed his CPAP compliance data from 09/30/2018 through 10/29/2018, which is a total of 30 days, during which time he used his CPAP every night with percent used days greater than 4 hours at 83%, indicating very good compliance with an average usage of 6 hours and 4 minutes, residual AHI at goal at 1.4 per hour, leak acceptable with the 95th percentile at 16.4 L/m on a pressure of 9 cm with EPR of 3. He reports doing well with CPAP therapy. He is up-to-date with his supplies typically, using nasal pillows. He is reducing Lyrica, which was for RLS, feels like he is having SEs. Has not had Botox in some time, he states. Has had some falls when R leg gave out. Feels like he may need rehab again. Using 4 point cane consistently at home.   The patient's allergies, current medications, family history, past medical history, past social history, past surgical history and problem list were reviewed and updated as appropriate.    Previously (copied from previous notes for reference):   I saw him on 10/25/2016, at which time he was using CPAP regularly. He had been receiving Botox injections for poststroke spasticity under Dr. Letta Pate. He was exercising on a regular basis. He had  support system.    I reviewed his CPAP compliance data from 09/29/2017 through 10/28/2017 which is a total of 30 days, during which time he used his machine 28 days with percent used days greater than 4 hours at 73%, indicating adequate compliance with an average usage of 5 hours and 20 minutes, residual AHI at 2 per hour, leak acceptable with the 95th percentile of 15.2 L/m on a pressure of 9 cm with EPR of 3.    I saw him on 07/22/2015, at which time he was compliant with CPAP therapy. He was in the emergency room in June 2016 for right-sided weakness and had workup for TIA including echocardiogram, Doppler, EEG and MRI, workup negative for an acute stroke like events. He was not drinking enough water and I encouraged him to be better hydrated.    I reviewed his CPAP compliance data from 07/25/2016 through 10/22/2016, which is a total of 90 days, during which time he used his machine 73 days with percent used days greater than 4 hours at 43%, indicating decline compliance and suboptimal compliance with an average usage for 4 hours and 15 minutes, residual AHI 2.6 per hour, leak acceptable with the 95th percentile at 12 L/m on a pressure of 9 cm with EPR I saw him on 01/19/2015, at which time he reported compliance with CPAP therapy. He preferred a CPAP pressure of 9 cm as opposed to 8 cm. He was exercising regularly. He was receiving Botox injections for his post stroke spasticity under Dr. Letta Pate with good results. He  was using a cane on his left side for mobility.   I reviewed his CPAP compliance data from 06/20/2015 through 07/19/2015 which is a total of 30 days during which time he used his machine 27 days with percent used days greater than 4 hours at 70%, indicating adequate compliance with an average usage of 4 hours and 41 minutes, residual AHI low at 1.9 per hour, leak at times high with the 95th percentile at 28.5 L/m on a pressure of 9 cm with EPR of 3.   I saw him on 01/08/2014, at which  time I suggested we bring him back for sleep study. He had a split-night sleep study on 01/13/2014 and we talked about his sleep test results and based on the test results I had prescribed CPAP therapy for him. His sleep efficiency at baseline was 69.9% with a sleep latency of 15 minutes and wake after sleep onset of 21 minutes with severe sleep fragmentation noted. He had a markedly elevated arousal index. He had absence of slow-wave sleep and absence of REM sleep prior to CPAP. He had rare PVCs and PACs on EKG. He had no PLMS. He had mild intermittent snoring. He had a total AHI of 58.9 per hour, baseline oxygen saturation was 94%, nadir was 85% during non-REM sleep. He was titrated on CPAP therapy for the rest of the night. Sleep efficiency was 70.1%. Wake after sleep onset was 79.5 minutes with mild to moderate sleep fragmentation noted. He achieved slow-wave sleep and REM sleep after CPAP. He was titrated from 5-8 cm. His AHI was 0 per hour on a pressure of 8. Supine REM sleep was achieved. Based on the test results are prescribed CPAP therapy at home. Of note, he no showed for an appointment on 01/14/2015.   I first met him on 04/04/2013 at which time I suggested an autoPAP trial for 30 days. His old CPAP machine was set for 10 cm, but was defective and he bought a new machine on his own, which was set at 7 cm, then increased to 9 cm, as I understand.   His typical bedtime is reported to be around 9 to 10 PM and usual wake time is around 5 AM. Sleep onset typically occurs within a few minutes. He reports feeling not well rested upon awakening. He wakes up on an average 2 in the middle of the night and has to go to the bathroom 2 times on a typical night. He denies recent morning headaches.   He reports excessive daytime somnolence (EDS) and His Epworth Sleepiness Score (ESS) is 15/24 today. He has been known to snore for the past many years. He had 2 sleep studies in the past and he has records today,  which I reviewed: He had a baseline sleep study on 12/26/2007 at [facility] in [place]. His overall AHI was 40 per hour. His oxygen nadir was 78%. Moderate to severe sleep fragmentation was noted and an increased arousal index. He had periodic leg movements of sleep. He had a CPAP titration study on 05/21/2008. He had periodic leg movements of sleep, oxygen nadir was 92% for the night.   He feels that the pressure is not adequate as he does not wake up rested and has daytime somnolence. He feels that he may be snoring despite using CPAP. He had another machine which could not be fixed. He goes to the gym and drives a car. He lives alone and cooks for himself. He uses  a 4 prong cane on the left side to mobilize. He is up for Botox injections in the right upper extremity.    His Past Medical History Is Significant For: Past Medical History:  Diagnosis Date  . Anemia   . Basal cell carcinoma 2018   [place]   . Basal cell carcinoma of nose 2002   bridge of nose  . BPH with obstruction/lower urinary tract symptoms    failed flomax, uroxatral, myrbetriq - sees urology [name]) pending videourodynamics  . CKD (chronic kidney disease) stage 3, GFR 30-59 ml/min (HCC) 03/11/2012   Renal US 02/2014 - multiple kidney cysts   . CVA (cerebral infarction) 03/07/12   slurred speech; right hemplegia, left handed - completed PT 07/2014 (17 sessions)  . Depression    mild, on cymbalta per prior PCP  . Erectile dysfunction    per urology (prostaglandin injection)  . GERD (gastroesophageal reflux disease)    with sliding HH  . H/O hiatal hernia   . History of kidney problems 12/2010   lacerated left kidney after fall  . HLD (hyperlipidemia)   . Kidney cysts 02/2014   bilateral (renal US)  . Migraines 03/11/12   now rare  . OSA on CPAP    sleep study 01/2014 - severe, CPAP at 10, previously on nuvigil (Dr. [name])  . Palpitations 01/2012   holter WNL, rare PAC/PVCs  .  Presbycusis of both ears 2016   rec amplification [facility])  . Restless leg syndrome    well controlled on mirapex  . Rosacea    [place]  . TIA (transient ischemic attack)   . Type 2 diabetes, controlled, with neuropathy [facility]) 2011   [name] in [place]    His Past Surgical History Is Significant For: Past Surgical History:  Procedure Laterality Date  . ABI  2007   WNL  . CARDIAC CATHETERIZATION  2004   normal; [place]  . CARDIOVASCULAR STRESS TEST  02/2014   WNL, low risk scan, EF 68%  . CAROTID ENDARTERECTOMY    . CATARACT EXTRACTION W/ INTRAOCULAR LENS IMPLANT  11//2012 - 08/2011  . COLONOSCOPY  03/2011   poor prep, unable to biopsy polyp in tic fold, rpt 3 mo [name])  . COLONOSCOPY  06/2011   mult polyps adenomatous, severe diverticulosis, rpt 3 yrs [name])  . CYSTOSCOPY WITH URETHRAL DILATATION N/A 07/24/2013   Procedure: CYSTOSCOPY WITH URETHRAL DILATATION;  Surgeon: [name], MD;  Location: [facility];  Service: Urology;  Laterality: N/A;  . ESOPHAGOGASTRODUODENOSCOPY  2006   duodenitis, medual HH, Grade 2 reflux, mild gastritis  . ESOPHAGOGASTRODUODENOSCOPY  07/2011   mild chronic gastritis  . [place]  . [place]; 2002   left, then repaired  . MRI brain  04/2010   WNL  . PROSTATE SURGERY  02/2009   PVP (laser)  . SKIN CANCER EXCISION  2002   bridge of nose, [facility]  . TONSILLECTOMY AND ADENOIDECTOMY  ~ 1945  . TRANSURETHRAL INCISION OF PROSTATE N/A 07/24/2013   Procedure: TRANSURETHRAL INCISION OF THE PROSTATE (TUIP);  Surgeon: [name], MD;  Location: [facility];  Service: Urology;  Laterality: N/A;  . URETHRAL DILATION  2014   [place]    His Family History Is Significant For: Family History  Problem Relation Age of Onset  . Stroke Father   . Lung cancer Father 102        smoker  . Stroke Brother   . Diabetes  Brother 51  . Lung cancer Brother   . Alcoholism Sister   . Kidney disease Sister   . Coronary  artery disease Neg Hx     His Social History Is Significant For: Social History   Socioeconomic History  . Marital status: Unknown    Spouse name: Not on file  . Number of children: 2  . Years of education: MD  . Highest education level: Not on file  Occupational History  . Occupation: retired    Fish farm manager: RETIRED  Social Needs  . Financial resource strain: Not on file  . Food insecurity:    Worry: Not on file    Inability: Not on file  . Transportation needs:    Medical: Not on file    Non-medical: Not on file  Tobacco Use  . Smoking status: Never Smoker  . Smokeless tobacco: Never Used  . Tobacco comment: "used to smoke pipe when I was younger"  Substance and Sexual Activity  . Alcohol use: Yes    Alcohol/week: 4.0 standard drinks    Types: 2 Glasses of wine, 2 Shots of liquor per week    Comment: occasional  . Drug use: No  . Sexual activity: Yes  Lifestyle  . Physical activity:    Days per week: Not on file    Minutes per session: Not on file  . Stress: Not on file  Relationships  . Social connections:    Talks on phone: Not on file    Gets together: Not on file    Attends religious service: Not on file    Active member of club or organization: Not on file    Attends meetings of clubs or organizations: Not on file    Relationship status: Not on file  Other Topics Concern  . Not on file  Social History Narrative   Caffeine: 1 cup coffee/day    Occupation: ophthalmologist, retired   Edu: MD   Activity: going to gym 3x/wk with trainer   Diet: some water, fruits/vegetables some, red meat a few times, fish 1x/wk   POA - [name] (Daughter)    His Allergies Are:  Allergies  Allergen Reactions  . Lisinopril Cough    tachycardia  . Nexium [Esomeprazole] Other (See Comments)    Headache  . Niacin And Related Other (See Comments)    flushing  :   His Current Medications Are:  Outpatient Encounter Medications as of 11/04/2018  Medication Sig  .  capsaicin (ZOSTRIX) 0.025 % cream Apply topically 2 (two) times daily.  . clopidogrel (PLAVIX) 75 MG tablet TAKE 1 TABLET DAILY  . Coenzyme Q10 (COQ10) 150 MG CAPS Take 1 capsule by mouth 2 (two) times daily.  . CRESTOR 40 MG tablet TAKE 1 TABLET DAILY  . diazepam (VALIUM) 5 MG tablet Take 1 tablet (5 mg total) by mouth every 8 (eight) hours as needed for muscle spasms.  . diclofenac sodium (VOLTAREN) 1 % GEL APPLY 2 GRAMS TOPICALLY FOUR TIMES A DAY  . DULoxetine (CYMBALTA) 60 MG capsule TAKE 1 CAPSULE DAILY  . FOLBEE 2.5-25-1 MG TABS tablet TAKE 1 TABLET DAILY  . ibuprofen (ADVIL,MOTRIN) 200 MG tablet Take 400 mg by mouth every 6 (six) hours as needed for mild pain or moderate pain.  . Insulin Degludec (TRESIBA FLEXTOUCH) 200 UNIT/ML SOPN Inject 60 Units into the skin at bedtime.   . Insulin Pen Needle (NOVOFINE) 30G X 8 MM MISC Use to inject insulin once daily dx:E11.40  . levothyroxine (LEVOTHROID) 25  MCG tablet Take 1 tablet (25 mcg total) by mouth daily before breakfast. Per endo [name]  . LYRICA 75 MG capsule Take 1 capsule by mouth 2 (two) times daily.  . metoprolol tartrate (LOPRESSOR) 25 MG tablet Take 0.5 tablets (12.5 mg total) by mouth at bedtime. With AM dose if needed  . metroNIDAZOLE (METROCREAM) 0.75 % cream Apply 1 application topically 3 (three) times daily.  . nitroGLYCERIN (NITROSTAT) 0.4 MG SL tablet DISSOLVE 1 TABLET UNDER THE TONGUE EVERY 5 MINUTES AS NEEDED FOR CHEST PAIN  . omega-3 acid ethyl esters (LOVAZA) 1 g capsule Take 2 capsules (2 g total) by mouth 2 (two) times daily.  Marland Kitchen omeprazole (PRILOSEC) 40 MG capsule TAKE 1 CAPSULE DAILY  . pramipexole (MIRAPEX) 1 MG tablet TAKES 2 TABLETS AT 3 PM, 2 TABLETS AT BEDTIME AS NEEDED  . Semaglutide (OZEMPIC) 0.25 or 0.5 MG/DOSE SOPN Inject into the skin.  Marland Kitchen traZODone (DESYREL) 50 MG tablet Take 50 mg by mouth at bedtime as needed for sleep.  . valsartan (DIOVAN) 40 MG tablet Take 1 tablet (40 mg total) by mouth daily.  .  vitamin C (ASCORBIC ACID) 500 MG tablet Take 1,000 mg by mouth daily.    No facility-administered encounter medications on file as of 11/04/2018.   :  Review of Systems:  Out of a complete 14 point review of systems, all are reviewed and negative with the exception of these symptoms as listed below: Review of Systems  Neurological:       Pt presents today to discuss his cpap. Pt reports that his cpap is going well.    Objective:  Neurological Exam  Physical Exam Physical Examination:   Vitals:   11/04/18 1018  BP: 120/65  Pulse: 71   General Examination: The patient is a very pleasant 85 y.o. male in no acute distress. He appears well-developed and well-nourished and well groomed.   HEENT:Normocephalic, atraumatic, pupils are equal, round and reactive to light. Extraocular tracking is preserved. Hearing is grossly intact. Fairly symmetric face, very mild right lower facial weakness noted, speech is minimally dysarthric and mildly hypophonic. No tremor is noted, neck shows FROM. Oropharynx exam shows no significant mouth dryness, mild to moderate airway crowding.  Chest:Clear to auscultation without wheezing, rhonchi or crackles noted.  Heart:S1+S2+0, regular and normal without murmurs, rubs or gallops noted.   Abdomen:Soft, non-tender and non-distended.  Extremities:There istrace edema in theright ankle, wearing an AFO on the right. Wearing compression socks.  Skin: Warm and dry without trophic changes noted.  Musculoskeletal: exam reveals no obvious joint deformities, tenderness or joint swelling or erythema.   Neurologically:  Mental status: The patient is awake, alert and oriented in all 4 spheres.Hisimmediate and remote memory, attention, language skills and fund of knowledge are appropriate. There is no evidence of aphasia, agnosia, apraxia or anomia. Speech is clear with normal prosody and enunciation. Thought process is linear. Mood is normaland affect  is normal.  Cranial nerves II - XII are as described above under HEENT exam.  Motor exam: Normal bulk, strength and tone is notedon the left, on the right he has 4 out of 5 strength in the upper and lower extremities, tone is increased in the right upper and right lower extremity, but better in the RUE.  Romberg isnot testablesafely.Fine motor skillsare preserved on the left, impaired on the right. He stands with difficulty and has a 4 pronged cane that he uses on the left, mild circumduction on R.Sensory exam is intact  to light touch. No cerebellar signs.  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 He has been on CPAP of 9 cm, still good with his compliance and reports being up-to-date with his supplies. He is commended for his ongoing good treatment adherence. He is advised to follow-up routinely with one of our nurse practitioners for sleep apnea recheck in one year. I answered all his questions today and he was in agreement. I spent 15 minutes in total face-to-face time with the patient, more than 50% of which was spent in counseling and coordination of care, reviewing test results, reviewing medication and discussing or reviewing the diagnosis of OSA, its prognosis and treatment options. Pertinent laboratory and imaging test results that were available during this visit with the patient were reviewed by me and considered in my medical decision making (see chart for details).

## 2018-11-05 ENCOUNTER — Other Ambulatory Visit: Payer: Self-pay

## 2018-11-05 MED ORDER — OMEPRAZOLE 40 MG PO CPDR: 40.0000 mg | DELAYED_RELEASE_CAPSULE | Freq: Every day | ORAL | 1 refills | Status: DC

## 2018-11-05 MED ORDER — CLOPIDOGREL BISULFATE 75 MG PO TABS: 75.0000 mg | ORAL_TABLET | Freq: Every day | ORAL | 0 refills | Status: DC

## 2018-11-05 NOTE — Telephone Encounter (Signed)
Refilled times one in PCP absence

## 2018-11-05 NOTE — Telephone Encounter (Signed)
Will you address in Dr. Synthia Innocent absence?  Plavix Last rx:  03/20/18, #90/1 Last OV:  10/30/18, f/u Next OV:  11/29/18, 1 mo f/u

## 2018-11-07 ENCOUNTER — Ambulatory Visit (HOSPITAL_BASED_OUTPATIENT_CLINIC_OR_DEPARTMENT_OTHER): Payer: Medicare Other | Admitting: Physical Medicine & Rehabilitation

## 2018-11-07 ENCOUNTER — Encounter: Payer: Self-pay | Admitting: Physical Medicine & Rehabilitation

## 2018-11-07 ENCOUNTER — Encounter: Payer: Medicare Other | Attending: Physical Medicine & Rehabilitation

## 2018-11-07 VITALS — BP 134/71 | HR 61 | Resp 14 | Ht 68.0 in | Wt 190.0 lb

## 2018-11-07 DIAGNOSIS — I69398 Other sequelae of cerebral infarction: Secondary | ICD-10-CM

## 2018-11-07 DIAGNOSIS — R269 Unspecified abnormalities of gait and mobility: Secondary | ICD-10-CM

## 2018-11-07 DIAGNOSIS — I69351 Hemiplegia and hemiparesis following cerebral infarction affecting right dominant side: Secondary | ICD-10-CM

## 2018-11-07 NOTE — Patient Instructions (Signed)
PT office should call you for appt

## 2018-11-07 NOTE — Progress Notes (Signed)
Subjective:    Patient ID: James Spurr, MD, male    DOB: March 22, 1931, 85 y.o.   MRN: [id number] Admitted March 11, 2012, with progressive  right-sided weakness. MRI revealed moderate-size acute infarct  affecting the left posterior putamen and left periventricular white  matter. MRA of the head was unremarkable. Carotid Dopplers with no ICA  stenosis. Echocardiogram with ejection fraction of 65% and grade 1  diastolic dysfunction. Neurology consulted, placed on Plavix thera HPI Chief complaint: Feeling weaker 85 year old male with history of remote left basal ganglia infarct causing chronic right spastic hemiplegia.  He was last seen in June 2019.  He had a Botox injection in May 2019 but none since that time  PCP switched pt from Neurontin to lyrica  Doesn't feel "right in the head", switched back to Neurontin   Pt has had 3 falls causing Right hip bruise and shoulder soreness  Now on BB for elevated BP  On Plavix for stroke prophylaxis  Repeat MRI showed no new infarcts or abnormalities. Pt feels like it started when he was switched to lyrica   No muscle aches, patient had sed rate that was reportedly normal.  More recently he underwent a basic metabolic package which showed mildly elevated creatinine, GFR reduced, CBC was normal on 10/30/2018, T4 and TSH were normal, hemoglobin A1c mildly elevated Pain Inventory Average Pain 2 Pain Right Now 2 My pain is dull  In the last 24 hours, has pain interfered with the following? General activity 3 Relation with others 4 Enjoyment of life 7 What TIME of day is your pain at its worst? morning Sleep (in general) Good  Pain is worse with: unsure Pain improves with: medication Relief from Meds: 9  Mobility walk with assistance use a cane use a walker how many minutes can you walk? 20 ability to climb steps?  yes do you drive?  yes  Function retired  Neuro/Psych weakness trouble walking  Prior Studies Any changes  since last visit?  yes CT/MRI  Physicians involved in your care Any changes since last visit?  no   Family History  Problem Relation Age of Onset  . Stroke Father   . Lung cancer Father 38        smoker  . Stroke Brother   . Diabetes Brother 24  . Lung cancer Brother   . Alcoholism Sister   . Kidney disease Sister   . Coronary artery disease Neg Hx    Social History   Socioeconomic History  . Marital status: Unknown    Spouse name: Not on file  . Number of children: 2  . Years of education: MD  . Highest education level: Not on file  Occupational History  . Occupation: retired    Fish farm manager: RETIRED  Social Needs  . Financial resource strain: Not on file  . Food insecurity:    Worry: Not on file    Inability: Not on file  . Transportation needs:    Medical: Not on file    Non-medical: Not on file  Tobacco Use  . Smoking status: Never Smoker  . Smokeless tobacco: Never Used  . Tobacco comment: "used to smoke pipe when I was younger"  Substance and Sexual Activity  . Alcohol use: Yes    Alcohol/week: 4.0 standard drinks    Types: 2 Glasses of wine, 2 Shots of liquor per week    Comment: occasional  . Drug use: No  . Sexual activity: Yes  Lifestyle  . Physical  activity:    Days per week: Not on file    Minutes per session: Not on file  . Stress: Not on file  Relationships  . Social connections:    Talks on phone: Not on file    Gets together: Not on file    Attends religious service: Not on file    Active member of club or organization: Not on file    Attends meetings of clubs or organizations: Not on file    Relationship status: Not on file  Other Topics Concern  . Not on file  Social History Narrative   Caffeine: 1 cup coffee/day    Occupation: ophthalmologist, retired   Edu: MD   Activity: going to gym 3x/wk with trainer   Diet: some water, fruits/vegetables some, red meat a few times, fish 1x/wk   POA - [name] (Daughter)   Past Surgical  History:  Procedure Laterality Date  . ABI  2007   WNL  . CARDIAC CATHETERIZATION  2004   normal; [place]  . CARDIOVASCULAR STRESS TEST  02/2014   WNL, low risk scan, EF 68%  . CAROTID ENDARTERECTOMY    . CATARACT EXTRACTION W/ INTRAOCULAR LENS IMPLANT  11//2012 - 08/2011  . COLONOSCOPY  03/2011   poor prep, unable to biopsy polyp in tic fold, rpt 3 mo [name])  . COLONOSCOPY  06/2011   mult polyps adenomatous, severe diverticulosis, rpt 3 yrs [name])  . CYSTOSCOPY WITH URETHRAL DILATATION N/A 07/24/2013   Procedure: CYSTOSCOPY WITH URETHRAL DILATATION;  Surgeon: [name], MD;  Location: [facility];  Service: Urology;  Laterality: N/A;  . ESOPHAGOGASTRODUODENOSCOPY  2006   duodenitis, medual HH, Grade 2 reflux, mild gastritis  . ESOPHAGOGASTRODUODENOSCOPY  07/2011   mild chronic gastritis  . [place]  . [place]; 2002   left, then repaired  . MRI brain  04/2010   WNL  . PROSTATE SURGERY  02/2009   PVP (laser)  . SKIN CANCER EXCISION  2002   bridge of nose, BCC  . TONSILLECTOMY AND ADENOIDECTOMY  ~ 1945  . TRANSURETHRAL INCISION OF PROSTATE N/A 07/24/2013   Procedure: TRANSURETHRAL INCISION OF THE PROSTATE (TUIP);  Surgeon: [name], MD;  Location: [facility];  Service: Urology;  Laterality: N/A;  . URETHRAL DILATION  2014   [place]   Past Medical History:  Diagnosis Date  . Anemia   . Basal cell carcinoma 2018   [place]   . Basal cell carcinoma of nose 2002   bridge of nose  . BPH with obstruction/lower urinary tract symptoms    failed flomax, uroxatral, myrbetriq - sees urology [name]) pending videourodynamics  . CKD (chronic kidney disease) stage 3, GFR 30-59 ml/min (HCC) 03/11/2012   Renal US 02/2014 - multiple kidney cysts   . CVA (cerebral infarction) 03/07/12   slurred speech; right hemplegia, left handed - completed PT 07/2014 (17 sessions)  . Depression    mild, on cymbalta per prior PCP  . Erectile  dysfunction    per urology (prostaglandin injection)  . GERD (gastroesophageal reflux disease)    with sliding HH  . H/O hiatal hernia   . History of kidney problems 12/2010   lacerated left kidney after fall  . HLD (hyperlipidemia)   . Kidney cysts 02/2014   bilateral (renal US)  . Migraines 03/11/12   now rare  . OSA on CPAP    sleep study 01/2014 - severe, CPAP at 10, previously on  nuvigil (Dr. [name])  . Palpitations 01/2012   holter WNL, rare PAC/PVCs  . Presbycusis of both ears 2016   rec amplification [facility])  . Restless leg syndrome    well controlled on mirapex  . Rosacea    [place]  . TIA (transient ischemic attack)   . Type 2 diabetes, controlled, with neuropathy ([place]) 2011   [name] in [place]   BP 134/71   Pulse 61   Resp 14   Ht 5\' 8"  (1.727 m)   Wt 190 lb (86.2 kg)   SpO2 99%   BMI 28.89 kg/m   Opioid Risk Score:   Fall Risk Score:  `1  Depression screen PHQ 2/9  Depression screen [facility] 2/9 03/08/2018 12/14/2017 07/18/2017 07/17/2016 01/17/2016 10/05/2015 06/23/2015  Decreased Interest 0 0 0 0 0 0 0  Down, Depressed, Hopeless 1 1 1  0 0 0 0  PHQ - 2 Score 1 1 1  0 0 0 0  Altered sleeping - - 2 - - - -  Tired, decreased energy - - 2 - - - -  Change in appetite - - 1 - - - -  Feeling bad or failure about yourself  - - 1 - - - -  Trouble concentrating - - 1 - - - -  Moving slowly or fidgety/restless - - 0 - - - -  Suicidal thoughts - - 0 - - - -  PHQ-9 Score - - 8 - - - -  Some recent data might be hidden     Review of Systems  Constitutional: Negative.   HENT: Negative.   Eyes: Negative.   Respiratory: Negative.   Cardiovascular: Negative.   Gastrointestinal: Negative.   Endocrine: Negative.   Genitourinary: Negative.   Musculoskeletal: Positive for gait problem.  Skin: Negative.   Allergic/Immunologic: Negative.   Neurological: Positive for weakness.  Hematological: Negative.   Psychiatric/Behavioral: Negative.   All other systems reviewed and  are negative.      Objective:   Physical Exam Constitutional:      Appearance: Normal appearance.  Eyes:     Extraocular Movements: Extraocular movements intact.     Pupils: Pupils are equal, round, and reactive to light.  Neck:     Musculoskeletal: Normal range of motion.  Cardiovascular:     Rate and Rhythm: Normal rate and regular rhythm.  Pulmonary:     Effort: Pulmonary effort is normal. No respiratory distress.     Breath sounds: Normal breath sounds. No stridor.  Abdominal:     General: Abdomen is flat. Bowel sounds are normal.     Palpations: Abdomen is soft.  Skin:    General: Skin is warm and dry.  Neurological:     General: No focal deficit present.     Mental Status: He is alert and oriented to person, place, and time.     Sensory: Sensory deficit present.     Comments: Reduced sensation to LT below ankle  Motor exam 4- at the right deltoid bicep tricep finger flexors and extensors There is decreased fine motor in the right upper limb There is evidence of decreased rapid alternating motion in the right upper limb Right lower limb has 4- at the hip flexor 4 at the knee extensor and 3 at the ankle dorsiflexor. Motor strength on the left side is 5/5 in the left deltoid bicep tricep grip hip flexor knee extensor ankle dorsiflexor. Gait is with a quad cane and right AFO he has widened base of  support and decreased step length. Romberg is positive  Psychiatric:        Attention and Perception: Attention normal.        Mood and Affect: Mood is depressed.        Behavior: Behavior normal.           Assessment & Plan:  1.  History of left subcortical infarct with chronic right spastic hemiplegia.  Overall I do not see any change in his neurologic function. His spasticity is adequately managed with stretching.  I do not think he needs any further Botox injections at this time. 2.  Decline in functioning this is likely multifactorial his medical work-up as well as  MRI were unremarkable.  He does appear a bit down but has been placed on duloxetine 60 mg/day prior by his primary care which I think is a good choice. We discussed that in the absence of clear-cut medical diagnoses this is likely a combination of CNS depressant effect from Lyrica plus minus some postop depression.  Agree with discontinuation of the Lyrica.  This was just yesterday so it may take a couple days before he feels any effects. I do think he would benefit from outpatient physical therapy given his recent falls and decline in balance. Physical medicine rehab follow-up in 6 weeks Primary care follow-up Neurology follow-up

## 2018-11-08 ENCOUNTER — Telehealth: Payer: Self-pay

## 2018-11-08 NOTE — Telephone Encounter (Signed)
James Bell at [facility] request cb to clarify instructions for metoprolol tartrate 25 mg. Instructions are take 1/2 tab at hs . With AM dose if needed.Please advise.

## 2018-11-08 NOTE — Telephone Encounter (Signed)
That is correct. Takes 12.5mg  every night, take 12.5mg  in the morning PRN racing heart.

## 2018-11-08 NOTE — Telephone Encounter (Signed)
Spoke with [employer], relaying Dr. Synthia Innocent instructions.  Verbalizes understanding and made documentation.

## 2018-11-19 ENCOUNTER — Other Ambulatory Visit: Payer: Self-pay

## 2018-11-19 ENCOUNTER — Ambulatory Visit: Payer: Medicare Other | Attending: Physical Medicine & Rehabilitation | Admitting: Physical Therapy

## 2018-11-19 ENCOUNTER — Encounter: Payer: Self-pay | Admitting: Physical Therapy

## 2018-11-19 DIAGNOSIS — R2689 Other abnormalities of gait and mobility: Secondary | ICD-10-CM | POA: Diagnosis not present

## 2018-11-19 DIAGNOSIS — M6281 Muscle weakness (generalized): Secondary | ICD-10-CM | POA: Insufficient documentation

## 2018-11-19 DIAGNOSIS — R261 Paralytic gait: Secondary | ICD-10-CM | POA: Insufficient documentation

## 2018-11-19 DIAGNOSIS — R2681 Unsteadiness on feet: Secondary | ICD-10-CM | POA: Insufficient documentation

## 2018-11-19 DIAGNOSIS — R296 Repeated falls: Secondary | ICD-10-CM | POA: Diagnosis not present

## 2018-11-19 DIAGNOSIS — R293 Abnormal posture: Secondary | ICD-10-CM | POA: Diagnosis not present

## 2018-11-19 DIAGNOSIS — R269 Unspecified abnormalities of gait and mobility: Secondary | ICD-10-CM | POA: Insufficient documentation

## 2018-11-19 NOTE — Therapy (Signed)
[facility] [address] [place], [name], [address] Phone: [phone]   Fax:  [phone]  Physical Therapy Evaluation  Patient Details  Name: James TAVANO, MD MRN: [id number] Date of Birth: [birth year] Referring Provider (PT): Charlett Blake MD   Encounter Date: 11/19/2018  PT End of Session - 11/19/18 1219    Visit Number  1    Number of Visits  26    Date for PT Re-Evaluation  02/17/19    Authorization Type  Medicare & Tricare    Authorization Time Period  [employer] will pick up 100% after Medicare    PT Start Time  0845    PT Stop Time  0930    PT Time Calculation (min)  45 min    Equipment Utilized During Treatment  Gait belt    Activity Tolerance  Patient tolerated treatment well    Behavior During Therapy  [facility] for tasks assessed/performed       Past Medical History:  Diagnosis Date  . Anemia   . Basal cell carcinoma 2018   R temple   . Basal cell carcinoma of nose 2002   bridge of nose  . BPH with obstruction/lower urinary tract symptoms    failed flomax, uroxatral, myrbetriq - sees urology [name]) pending videourodynamics  . CKD (chronic kidney disease) stage 3, GFR 30-59 ml/min (HCC) 03/11/2012   Renal US 02/2014 - multiple kidney cysts   . CVA (cerebral infarction) 03/07/12   slurred speech; right hemplegia, left handed - completed PT 07/2014 (17 sessions)  . Depression    mild, on cymbalta per prior PCP  . Erectile dysfunction    per urology (prostaglandin injection)  . GERD (gastroesophageal reflux disease)    with sliding HH  . H/O hiatal hernia   . History of kidney problems 12/2010   lacerated left kidney after fall  . HLD (hyperlipidemia)   . Kidney cysts 02/2014   bilateral (renal US)  . Migraines 03/11/12   now rare  . OSA on CPAP    sleep study 01/2014 - severe, CPAP at 10, previously on nuvigil (Dr. [name])  . Palpitations 01/2012   holter WNL, rare PAC/PVCs  .  Presbycusis of both ears 2016   rec amplification [facility])  . Restless leg syndrome    well controlled on mirapex  . Rosacea    [place]  . TIA (transient ischemic attack)   . Type 2 diabetes, controlled, with neuropathy [facility]) 2011   [name] in [facility]    Past Surgical History:  Procedure Laterality Date  . ABI  2007   WNL  . CARDIAC CATHETERIZATION  2004   normal; [place]  . CARDIOVASCULAR STRESS TEST  02/2014   WNL, low risk scan, EF 68%  . CAROTID ENDARTERECTOMY    . CATARACT EXTRACTION W/ INTRAOCULAR LENS IMPLANT  11//2012 - 08/2011  . COLONOSCOPY  03/2011   poor prep, unable to biopsy polyp in tic fold, rpt 3 mo [name])  . COLONOSCOPY  06/2011   mult polyps adenomatous, severe diverticulosis, rpt 3 yrs [name])  . CYSTOSCOPY WITH URETHRAL DILATATION N/A 07/24/2013   Procedure: CYSTOSCOPY WITH URETHRAL DILATATION;  Surgeon: [name], MD;  Location: [facility];  Service: Urology;  Laterality: N/A;  . ESOPHAGOGASTRODUODENOSCOPY  2006   duodenitis, medual HH, Grade 2 reflux, mild gastritis  . ESOPHAGOGASTRODUODENOSCOPY  07/2011   mild chronic gastritis  . [place]  . [place];  2002   left, then repaired  . MRI brain  04/2010   WNL  . PROSTATE SURGERY  02/2009   PVP (laser)  . SKIN CANCER EXCISION  2002   bridge of nose, B[facility]  . TONSILLECTOMY AND ADENOIDECTOMY  ~ 1945  . TRANSURETHRAL INCISION OF PROSTATE N/A 07/24/2013   Procedure: TRANSURETHRAL INCISION OF THE PROSTATE (TUIP);  Surgeon: [name], MD;  Location: [facility];  Service: Urology;  Laterality: N/A;  . URETHRAL DILATION  2014   tann[place]baum    There were no vitals filed for this visit.   Subjective Assessment - 11/19/18 0852    Subjective  This 85yo male was referred to PT by [name], MD on 11/07/2018 with hemiparesis effecting right side & gait disturbance history of left subcortical infarct on 03/11/2014. He reports 3 recent falls with contusions. 1.  slipped in shower 2&3. stepping up ~3" threshold fell backwards    Pertinent History  left subcortical infarct on 03/11/2014. CVA, DM2, neuropathy, restless leg syndrome, HTN    Limitations  Lifting;Standing;Walking;House hold activities    Patient Stated Goals  improve strength of right leg. improve walking & balance without falls.     Currently in Pain?  No/denies         [facility] PT Assessment - 11/19/18 0845      Assessment   Medical Diagnosis  right hemiparesis, gait abnormality    Referring Provider (PT)  Charlett Blake MD    Onset Date/Surgical Date  11/07/18   MD referrral to PT   Hand Dominance  Right    Prior Therapy  15 PT visits 07/28/2014-09/24/2014, 11 PT visits 03/02/2015-04/27/2015, 16 PT visits 10/20/2015-12/16/2015      Precautions   Precautions  Fall    Required Braces or Orthoses  Other Brace/Splint   [place] PLS AFO   Other Brace/Splint  reports wears AFO all awake hours      Restrictions   Weight Bearing Restrictions  No      Balance Screen   Has the patient fallen in the past 6 months  Yes    How many times?  3    Has the patient had a decrease in activity level because of a fear of falling?   Yes    Is the patient reluctant to leave their home because of a fear of falling?   No      Home Environment   Living Environment  Private residence    Living Arrangements  Alone    Available Help at Discharge  Family;Friend(s);Personal care attendant   paid aid 4hrs 2x/wk meal prep & cleaning   Type of [place] to enter    Entrance Stairs-Number of Steps  1    Entrance Stairs-Rails  None    Home Layout  One level    Home Equipment  Wheelchair - manual;Cane - quad;Wheelchair - power;Tub bench;Grab bars - tub/shower      Prior Function   Level of Independence  Independent with household mobility with device;Independent with community mobility with device    Vocation  Retired    Leisure  cooking, playing cards      Posture/Postural  Control   Posture/Postural Control  Postural limitations    Postural Limitations  Rounded Shoulders;Forward head;Posterior pelvic tilt;Weight shift left      Tone   Assessment Location  Right Upper Extremity;Right Lower Extremity      ROM / Strength   AROM /  PROM / Strength  Strength      Strength   Overall Strength  Deficits    Overall Strength Comments  LLE grossly 5/5    Right Hip Flexion  4/5    Right Hip Extension  3/5   tested in standing with BUE support   Right Hip ABduction  3+/5   tested in standing with BUE support   Right/Left Knee  Right    Right Knee Flexion  3-/5   tested in standing with BUE support   Right Knee Extension  4/5    Right/Left Ankle  Right    Right Ankle Dorsiflexion  3/5    Right Ankle Plantar Flexion  2+/5      Transfers   Transfers  Sit to Stand;Stand to Sit    Sit to Stand  6: Modified independent (Device/Increase time);With upper extremity assist;With armrests;From chair/3-in-1    Stand to Sit  6: Modified independent (Device/Increase time);With upper extremity assist;With armrests;To chair/3-in-1      Ambulation/Gait   Ambulation/Gait  Yes    Ambulation/Gait Assistance  5: Supervision    Ambulation/Gait Assistance Details  ankle pronates into AFO strut during gait.     Ambulation Distance (Feet)  150 Feet    Assistive device  Small based quad cane;Other (Comment)   [place] PLS AFO   Gait Pattern  Step-to pattern;Decreased arm swing - right;Decreased step length - left;Decreased stance time - right;Decreased stride length;Decreased hip/knee flexion - right;Decreased weight shift to right;Right hip hike;Right flexed knee in stance;Antalgic;Lateral hip instability;Abducted- right    Ambulation Surface  Indoor;Level    Gait velocity  1.41 ft/sec comfortable & 1.68 ft/sec fast   12/16/2015 was 2.11 ft/sec comfortable & 2.50 ft/sec fast   Stairs  Yes    Stairs Assistance  5: Supervision    Stair Management Technique  One rail Left;Step to  pattern;Alternating pattern;Forwards   alternate ascend & step-to descend   Number of Stairs  4    Gait Comments  PT assessed stepping onto 4" aerobic step with [facility] with supervision / instability.  He has fallen 2x at door with 4" threshold step. PT suspects RLE placed on runner under doorway close to threshold wedge would result in backwards balance loss.       Standardized Balance Assessment   Standardized Balance Assessment  Timed Up and Go Test;Dynamic Gait Index      Dynamic Gait Index   Level Surface  Moderate Impairment    Change in Gait Speed  Moderate Impairment    Gait with Horizontal Head Turns  Moderate Impairment    Gait with Vertical Head Turns  Moderate Impairment    Gait and Pivot Turn  Moderate Impairment    Step Over Obstacle  Moderate Impairment    Step Around Obstacles  Moderate Impairment    Steps  Moderate Impairment    Total Score  8      Timed Up and Go Test   Normal TUG (seconds)  23.33   SBQC,  on 12/16/2015 was 14.89sec     Functional Gait  Assessment   Gait assessed   Yes    Gait Level Surface  Walks 20 ft, slow speed, abnormal gait pattern, evidence for imbalance or deviates 10-15 in outside of the 12 in walkway width. Requires more than 7 sec to ambulate 20 ft.    Change in Gait Speed  Makes only minor adjustments to walking speed, or accomplishes a change in speed with significant gait deviations, deviates  10-15 in outside the 12 in walkway width, or changes speed but loses balance but is able to recover and continue walking.    Gait with Horizontal Head Turns  Performs head turns with moderate changes in gait velocity, slows down, deviates 10-15 in outside 12 in walkway width but recovers, can continue to walk.    Gait with Vertical Head Turns  Performs task with moderate change in gait velocity, slows down, deviates 10-15 in outside 12 in walkway width but recovers, can continue to walk.    Gait and Pivot Turn  Turns slowly, requires verbal cueing, or  requires several small steps to catch balance following turn and stop    Step Over Obstacle  Is able to step over one shoe box (4.5 in total height) but must slow down and adjust steps to clear box safely. May require verbal cueing.    Gait with Narrow Base of Support  Ambulates less than 4 steps heel to toe or cannot perform without assistance.    Gait with Eyes Closed  Cannot walk 20 ft without assistance, severe gait deviations or imbalance, deviates greater than 15 in outside 12 in walkway width or will not attempt task.    Ambulating Backwards  Walks 20 ft, slow speed, abnormal gait pattern, evidence for imbalance, deviates 10-15 in outside 12 in walkway width.    Steps  Two feet to a stair, must use rail.    Total Score  8      RUE Tone   RUE Tone  Hypertonic      RLE Tone   RLE Tone  Hypertonic                Objective measurements completed on examination: See above findings.                PT Short Term Goals - 11/19/18 1447      PT SHORT TERM GOAL #1   Title  Patient demostrates understanding of updated HEP. (All STGs Target Date: 12/20/2018)    Time  1    Period  Months    Status  New    Target Date  12/20/18      PT SHORT TERM GOAL #2   Title  Patient ambulates 300' with [name] & AFO with supervision.     Time  1    Period  Months    Status  New    Target Date  12/20/18      PT SHORT TERM GOAL #3   Title  Patient negotiates ramps & curbs with [facility] with supervision.     Time  1    Period  Months    Status  New    Target Date  12/20/18      PT SHORT TERM GOAL #4   Title  Timed Up & Go with SBQC <20 seconds.    Time  1    Period  Months    Status  New    Target Date  12/20/18        PT Long Term Goals - 11/19/18 1443      PT LONG TERM GOAL #1   Title  Patient verbalizes & demonstrates understanding of ongoing HEP / fitness plan. (All LTGs Target Date: 02/14/2019)    Time  3    Period  Months    Status  New    Target Date  02/14/19       PT LONG TERM GOAL #2   Title  Gait Velocity with SBQC comfortable pace >/= 2.11 ft/sec and fast >/= 2.50 ft/sec    Time  3    Period  Months    Status  New    Target Date  02/14/19      PT LONG TERM GOAL #3   Title  Timed Up & Go with SBQC <15 seconds to indicate lower fall risk.     Time  3    Period  Months    Status  New    Target Date  02/14/19      PT LONG TERM GOAL #4   Title  Dynamic Gait Index with [facility] >12/24 to indicate lower fall risk    Time  3    Period  Months    Status  New    Target Date  02/14/19      PT LONG TERM GOAL #5   Title  Patient negotiates ramps & curbs with SBQC modified independent.     Time  3    Period  Months    Status  New    Target Date  02/14/19             Plan - 11/19/18 1221    Clinical Impression Statement  This 85yo male has history of CVA 03/11/2014 and was functioning at community level with [facility] safely. He has fallen 3 times in last few months with contusions. Patient had decline in gait velocity at comfortable pace 1.41 ft/sec from 2.11 ft/sec on 12/16/2015 and fast pace 1.68 ft/sec from 2.50 ft/sec on 12/16/2015.  His Dynamic Gait Index 8/24 with SBQC indicates fall risk. Timed Up & Go 23.33 seconds (was 14.89 seconds on 12/16/2015) also indicates fall risk.  Patient appears to need new AFO as current is 85 yrs old and not appropriate design as he pronates into strut. Patient would benefit from skilled PT to improve moblity & decrease fall risk.      Personal Factors and Comorbidities  Age;Comorbidity 3+;Time since onset of injury/illness/exacerbation;Other   lives alone   Comorbidities  CVA, DM, neuropathy, HTN, restless leg syndrome    Examination-Activity Limitations  Lift;Locomotion Level;Stairs;Stand;Other   balance   Examination-Participation Restrictions  Community Activity    Stability/Clinical Decision Making  Evolving/Moderate complexity    Clinical Decision Making  Moderate    Rehab Potential  Good    PT Frequency   2x / week    PT Duration  12 weeks    PT Treatment/Interventions  ADLs/Self Care Home Management;DME Instruction;Gait training;Stair training;Functional mobility training;Therapeutic activities;Therapeutic exercise;Balance training;Neuromuscular re-education;Patient/family education;Orthotic Fit/Training;Vestibular    PT Next Visit Plan  HEP for flexibility & RLE strength.     Recommended Other Services  needs new AFO for RLE    Consulted and Agree with Plan of Care  Patient       Patient will benefit from skilled therapeutic intervention in order to improve the following deficits and impairments:  Abnormal gait, Decreased activity tolerance, Decreased balance, Decreased coordination, Decreased endurance, Decreased mobility, Decreased range of motion, Decreased strength, Impaired tone, Impaired flexibility, Postural dysfunction, Other (comment)(orthotic dependency)  Visit Diagnosis: Unsteadiness on feet  Muscle weakness (generalized)  Paralytic gait  Other abnormalities of gait and mobility  Abnormal posture  Repeated falls     Problem List Patient Active Problem List   Diagnosis Date Noted  . Headache 10/30/2018  . Peripheral neuropathy 01/15/2018  . Left wrist pain 01/15/2018  . Rosacea   . Gait disturbance, post-stroke 10/05/2015  . Presbycusis  of both ears   . Advanced care planning/counseling discussion 06/23/2015  . Impaired mobility 04/23/2015  . HTN (hypertension) 03/18/2015  . Chronic diastolic heart failure, NYHA class 1 ([place]) 03/18/2015  . Spastic hemiparesis affecting dominant side ([place]) 07/07/2014  . Chest pressure 02/03/2014  . Medicare annual wellness visit, subsequent 11/06/2013  . Erectile dysfunction due to arterial insufficiency 02/05/2013  . MDD (major depressive disorder), recurrent episode, moderate ([place])   . Palpitations 06/26/2012  . HLD (hyperlipidemia)   . Restless leg syndrome   . GERD (gastroesophageal reflux disease)   . BPH with  obstruction/lower urinary tract symptoms   . History of CVA (cerebrovascular accident) 03/11/2012  . Hemiparesis affecting right side as late effect of stroke ([place]) 03/11/2012  . Type 2 diabetes, controlled, with neuropathy ([place]) 03/11/2012  . OSA on CPAP 03/11/2012  . CKD (chronic kidney disease) stage 3, GFR 30-59 ml/min (HCC) 03/11/2012  . Migraines 03/11/2012    Quinta Eimer  PT, DPT 11/19/2018, 10:14 PM  [place] [address] [place], [name], [address] Phone: [phone]   Fax:  [phone]  Name: James ANDRES, MD MRN: [id number] Date of Birth: 22-Jan-1931

## 2018-11-21 ENCOUNTER — Ambulatory Visit: Payer: Medicare Other | Admitting: Physical Therapy

## 2018-11-21 ENCOUNTER — Encounter: Payer: Self-pay | Admitting: Physical Therapy

## 2018-11-21 DIAGNOSIS — R2689 Other abnormalities of gait and mobility: Secondary | ICD-10-CM

## 2018-11-21 DIAGNOSIS — R2681 Unsteadiness on feet: Secondary | ICD-10-CM | POA: Diagnosis not present

## 2018-11-21 DIAGNOSIS — R261 Paralytic gait: Secondary | ICD-10-CM | POA: Diagnosis not present

## 2018-11-21 DIAGNOSIS — R293 Abnormal posture: Secondary | ICD-10-CM

## 2018-11-21 DIAGNOSIS — M6281 Muscle weakness (generalized): Secondary | ICD-10-CM | POA: Diagnosis not present

## 2018-11-21 DIAGNOSIS — R296 Repeated falls: Secondary | ICD-10-CM | POA: Diagnosis not present

## 2018-11-21 NOTE — Therapy (Signed)
[facility] [address] [place], [name], [address] Phone: [phone]   Fax:  [phone]  Physical Therapy Treatment  Patient Details  Name: James VALENTE, MD MRN: [id number] Date of Birth: 09-01-1931 Referring Provider (PT): Charlett Blake MD   Encounter Date: 11/21/2018  PT End of Session - 11/21/18 1217    Visit Number  2    Number of Visits  26    Date for PT Re-Evaluation  02/17/19    Authorization Type  Medicare & Tricare    Authorization Time Period  T[employer]are will pick up 100% after Medicare    PT Start Time  1105    PT Stop Time  1150    PT Time Calculation (min)  45 min    Equipment Utilized During Treatment  Gait belt    Activity Tolerance  Patient tolerated treatment well    Behavior During Therapy  [facility] for tasks assessed/performed       Past Medical History:  Diagnosis Date  . Anemia   . Basal cell carcinoma 2018   R temple   . Basal cell carcinoma of nose 2002   bridge of nose  . BPH with obstruction/lower urinary tract symptoms    failed flomax, uroxatral, myrbetriq - sees urology [name]) pending videourodynamics  . CKD (chronic kidney disease) stage 3, GFR 30-59 ml/min (HCC) 03/11/2012   Renal US 02/2014 - multiple kidney cysts   . CVA (cerebral infarction) 03/07/12   slurred speech; right hemplegia, left handed - completed PT 07/2014 (17 sessions)  . Depression    mild, on cymbalta per prior PCP  . Erectile dysfunction    per urology (prostaglandin injection)  . GERD (gastroesophageal reflux disease)    with sliding HH  . H/O hiatal hernia   . History of kidney problems 12/2010   lacerated left kidney after fall  . HLD (hyperlipidemia)   . Kidney cysts 02/2014   bilateral (renal US)  . Migraines 03/11/12   now rare  . OSA on CPAP    sleep study 01/2014 - severe, CPAP at 10, previously on nuvigil (Dr. [name])  . Palpitations 01/2012   holter WNL, rare PAC/PVCs  .  Presbycusis of both ears 2016   rec amplification [facility])  . Restless leg syndrome    well controlled on mirapex  . Rosacea    [place]  . TIA (transient ischemic attack)   . Type 2 diabetes, controlled, with neuropathy [facility]) 2011   [name] in [facility]    Past Surgical History:  Procedure Laterality Date  . ABI  2007   WNL  . CARDIAC CATHETERIZATION  2004   normal; [place]  . CARDIOVASCULAR STRESS TEST  02/2014   WNL, low risk scan, EF 68%  . CAROTID ENDARTERECTOMY    . CATARACT EXTRACTION W/ INTRAOCULAR LENS IMPLANT  11//2012 - 08/2011  . COLONOSCOPY  03/2011   poor prep, unable to biopsy polyp in tic fold, rpt 3 mo [name])  . COLONOSCOPY  06/2011   mult polyps adenomatous, severe diverticulosis, rpt 3 yrs [name])  . CYSTOSCOPY WITH URETHRAL DILATATION N/A 07/24/2013   Procedure: CYSTOSCOPY WITH URETHRAL DILATATION;  Surgeon: [name], MD;  Location: [facility];  Service: Urology;  Laterality: N/A;  . ESOPHAGOGASTRODUODENOSCOPY  2006   duodenitis, medual HH, Grade 2 reflux, mild gastritis  . ESOPHAGOGASTRODUODENOSCOPY  07/2011   mild chronic gastritis  . [place]  . [place];  2002   left, then repaired  . MRI brain  04/2010   WNL  . PROSTATE SURGERY  02/2009   PVP (laser)  . SKIN CANCER EXCISION  2002   bridge of nose, BCC  . TONSILLECTOMY AND ADENOIDECTOMY  ~ 1945  . TRANSURETHRAL INCISION OF PROSTATE N/A 07/24/2013   Procedure: TRANSURETHRAL INCISION OF THE PROSTATE (TUIP);  Surgeon: [name], MD;  Location: [facility];  Service: Urology;  Laterality: N/A;  . URETHRAL DILATION  2014   [place]    There were no vitals filed for this visit.  Subjective Assessment - 11/21/18 1110    Subjective  No falls. No issues following PT evaluation.     Pertinent History  left subcortical infarct on 03/11/2014. CVA, DM2, neuropathy, restless leg syndrome, HTN    Limitations  Lifting;Standing;Walking;House hold activities     Patient Stated Goals  improve strength of right leg. improve walking & balance without falls.     Currently in Pain?  No/denies                            Balance Exercises - 11/21/18 1100      OTAGO PROGRAM   Head Movements  Sitting;5 reps    Neck Movements  Sitting;5 reps    Back Extension  Standing;5 reps    Trunk Movements  Standing;5 reps    Ankle Movements  Sitting;10 reps    Knee Extensor  10 reps    Knee Flexor  10 reps   counter support   Hip ABductor  10 reps   counter support   Ankle Plantorflexors  --   counter support 10 reps   Ankle Dorsiflexors  --   counter support 10 reps   Knee Bends  10 reps, support   counter support & chair behind    Backwards Walking  Support   counter RUE & SBQC LUE   Sideways Walking  Assistive device   counter support   Tandem Stance  10 seconds, support   counter support intermittently    Tandem Walk  Support   counter RUE & SBQC LUE   One Leg Stand  10 seconds, no support   modified with forefoot in counter, Hoovering hands   Heel Walking  Support   counter RUE & SBQC LUE   Toe Walk  Support   counter RUE & SBQC LUE   Sit to Stand  5 reps, one support   5 reps LUE & 5 reps RUE   Overall OTAGO Comments  warm up marching holding counter 1 minute;  seated hamstring stretch & ankle DF stretch 15 second hold 2 reps ea LE;  using NuStep for endurance.           PT Short Term Goals - 11/19/18 1447      PT SHORT TERM GOAL #1   Title  Patient demostrates understanding of updated HEP. (All STGs Target Date: 12/20/2018)    Time  1    Period  Months    Status  New    Target Date  12/20/18      PT SHORT TERM GOAL #2   Title  Patient ambulates 300' with [facility] & AFO with supervision.     Time  1    Period  Months    Status  New    Target Date  12/20/18      PT SHORT TERM GOAL #3   Title  Patient  negotiates ramps & curbs with [facility] with supervision.     Time  1    Period  Months    Status  New    Target  Date  12/20/18      PT SHORT TERM GOAL #4   Title  Timed Up & Go with SBQC <20 seconds.    Time  1    Period  Months    Status  New    Target Date  12/20/18        PT Long Term Goals - 11/19/18 1443      PT LONG TERM GOAL #1   Title  Patient verbalizes & demonstrates understanding of ongoing HEP / fitness plan. (All LTGs Target Date: 02/14/2019)    Time  3    Period  Months    Status  New    Target Date  02/14/19      PT LONG TERM GOAL #2   Title  Gait Velocity with SBQC comfortable pace >/= 2.11 ft/sec and fast >/= 2.50 ft/sec    Time  3    Period  Months    Status  New    Target Date  02/14/19      PT LONG TERM GOAL #3   Title  Timed Up & Go with SBQC <15 seconds to indicate lower fall risk.     Time  3    Period  Months    Status  New    Target Date  02/14/19      PT LONG TERM GOAL #4   Title  Dynamic Gait Index with [facility] >12/24 to indicate lower fall risk    Time  3    Period  Months    Status  New    Target Date  02/14/19      PT LONG TERM GOAL #5   Title  Patient negotiates ramps & curbs with SBQC modified independent.     Time  3    Period  Months    Status  New    Target Date  02/14/19            Plan - 11/21/18 1229    Clinical Impression Statement  Patient has general understanding of OTAGO HEP that PT introduced today.     Personal Factors and Comorbidities  Age;Comorbidity 3+;Time since onset of injury/illness/exacerbation;Other   lives alone   Comorbidities  CVA, DM, neuropathy, HTN, restless leg syndrome    Examination-Activity Limitations  Lift;Locomotion Level;Stairs;Stand;Other   balance   Examination-Participation Restrictions  Community Activity    Stability/Clinical Decision Making  Evolving/Moderate complexity    Rehab Potential  Good    PT Frequency  2x / week    PT Duration  12 weeks    PT Treatment/Interventions  ADLs/Self Care Home Management;DME Instruction;Gait training;Stair training;Functional mobility  training;Therapeutic activities;Therapeutic exercise;Balance training;Neuromuscular re-education;Patient/family education;Orthotic Fit/Training;Vestibular    PT Next Visit Plan  check on OTAGO HEP, work on gait scanning, negotiating around obstacles & steps especially simulated doorway threshold with runner.    Consulted and Agree with Plan of Care  Patient       Patient will benefit from skilled therapeutic intervention in order to improve the following deficits and impairments:  Abnormal gait, Decreased activity tolerance, Decreased balance, Decreased coordination, Decreased endurance, Decreased mobility, Decreased range of motion, Decreased strength, Impaired tone, Impaired flexibility, Postural dysfunction, Other (comment)(orthotic dependency)  Visit Diagnosis: Unsteadiness on feet  Muscle weakness (generalized)  Paralytic gait  Other abnormalities of gait  and mobility  Abnormal posture     Problem List Patient Active Problem List   Diagnosis Date Noted  . Headache 10/30/2018  . Peripheral neuropathy 01/15/2018  . Left wrist pain 01/15/2018  . Rosacea   . Gait disturbance, post-stroke 10/05/2015  . Presbycusis of both ears   . Advanced care planning/counseling discussion 06/23/2015  . Impaired mobility 04/23/2015  . HTN (hypertension) 03/18/2015  . Chronic diastolic heart failure, NYHA class 1 ([place]) 03/18/2015  . Spastic hemiparesis affecting dominant side ([place]) 07/07/2014  . Chest pressure 02/03/2014  . Medicare annual wellness visit, subsequent 11/06/2013  . Erectile dysfunction due to arterial insufficiency 02/05/2013  . MDD (major depressive disorder), recurrent episode, moderate ([place])   . Palpitations 06/26/2012  . HLD (hyperlipidemia)   . Restless leg syndrome   . GERD (gastroesophageal reflux disease)   . BPH with obstruction/lower urinary tract symptoms   . History of CVA (cerebrovascular accident) 03/11/2012  . Hemiparesis affecting right side as late effect  of stroke ([place]) 03/11/2012  . Type 2 diabetes, controlled, with neuropathy ([place]) 03/11/2012  . OSA on CPAP 03/11/2012  . CKD (chronic kidney disease) stage 3, GFR 30-59 ml/min (HCC) 03/11/2012  . Migraines 03/11/2012    James Bell  PT, DPT 11/21/2018, 12:32 PM  [place] [address] [place], [name], [address] Phone: [phone]   Fax:  [phone]  Name: MOLLY MASELLI, MD MRN: [id number] Date of Birth: 08-08-1931

## 2018-11-28 ENCOUNTER — Encounter: Payer: Self-pay | Admitting: Family Medicine

## 2018-11-28 NOTE — Progress Notes (Signed)
BP 120/66 (BP Location: Right Arm, Patient Position: Sitting, Cuff Size: Normal)   Pulse 74   Temp 98 F (36.7 C) (Oral)   Ht 5\' 8"  (1.727 m)   Wt 189 lb 2 oz (85.8 kg)   SpO2 95%   BMI 28.76 kg/m    CC: 1 mo f/u visit Subjective:    Patient ID: James Spurr, MD, male    DOB: 13-Apr-1931, 85 y.o.   MRN: [id number]  HPI: JSIAH MENTA, MD is a 85 y.o. male presenting on 11/29/2018 for Follow-up (Here for 1 mo f/u.)   See prior note for details. Seen here last month with HA with double vision and fatigue but recent reassuring vision exam and MRI. ?med related cause (lyrica) - I asked him to touch base with PMR about this medication.   Reviewed latest PM&R note. I did not previously prescribe lyrica, I don't see where PMR prescribed this. Pt thinks [name] prescribed this. He stopped lyrica last month. He has restarted gabapentin 300mg  1-2 capsules at night time but not regularly. He takes this with his mirapex.   We discussed possible referral back to neurology if ongoing headaches. Headaches have improved off lyrica. Still notes intermittent dizziness.   PM&R note reviewed - referred to outpatient PT to start next week.   Labs reassuring including TFTs.  Asks about colon cancer screening.       Relevant past medical, surgical, family and social history reviewed and updated as indicated. Interim medical history since our last visit reviewed. Allergies and medications reviewed and updated. Outpatient Medications Prior to Visit  Medication Sig Dispense Refill  . capsaicin (ZOSTRIX) 0.025 % cream Apply topically 2 (two) times daily. 60 g 0  . clopidogrel (PLAVIX) 75 MG tablet Take 1 tablet (75 mg total) by mouth daily. 90 tablet 0  . Coenzyme Q10 (COQ10) 150 MG CAPS Take 1 capsule by mouth 2 (two) times daily.    . CRESTOR 40 MG tablet TAKE 1 TABLET DAILY 90 tablet 1  . diazepam (VALIUM) 5 MG tablet Take 1 tablet (5 mg total) by mouth every 8 (eight) hours as needed for  muscle spasms. 90 tablet 0  . diclofenac sodium (VOLTAREN) 1 % GEL APPLY 2 GRAMS TOPICALLY FOUR TIMES A DAY 300 g 1  . DULoxetine (CYMBALTA) 60 MG capsule TAKE 1 CAPSULE DAILY 90 capsule 1  . FOLBEE 2.5-25-1 MG TABS tablet TAKE 1 TABLET DAILY 90 tablet 1  . ibuprofen (ADVIL,MOTRIN) 200 MG tablet Take 400 mg by mouth every 6 (six) hours as needed for mild pain or moderate pain.    . Insulin Degludec (TRESIBA FLEXTOUCH) 200 UNIT/ML SOPN Inject 60 Units into the skin at bedtime.     . Insulin Pen Needle (NOVOFINE) 30G X 8 MM MISC Use to inject insulin once daily dx:E11.40 300 each 3  . levothyroxine (LEVOTHROID) 25 MCG tablet Take 1 tablet (25 mcg total) by mouth daily before breakfast. Per endo [name]    . metoprolol tartrate (LOPRESSOR) 25 MG tablet Take 0.5 tablets (12.5 mg total) by mouth at bedtime. With AM dose if needed 90 tablet 1  . metroNIDAZOLE (METROCREAM) 0.75 % cream Apply 1 application topically 3 (three) times daily.    . nitroGLYCERIN (NITROSTAT) 0.4 MG SL tablet DISSOLVE 1 TABLET UNDER THE TONGUE EVERY 5 MINUTES AS NEEDED FOR CHEST PAIN 25 tablet 1  . omega-3 acid ethyl esters (LOVAZA) 1 g capsule Take 2 capsules (2 g total) by mouth  2 (two) times daily. 360 capsule 1  . omeprazole (PRILOSEC) 40 MG capsule Take 1 capsule (40 mg total) by mouth daily. 90 capsule 1  . pramipexole (MIRAPEX) 1 MG tablet TAKES 2 TABLETS AT 3 PM, 2 TABLETS AT BEDTIME AS NEEDED 360 tablet 1  . Semaglutide (OZEMPIC) 0.25 or 0.5 MG/DOSE SOPN Inject into the skin.    Marland Kitchen traZODone (DESYREL) 50 MG tablet Take 50 mg by mouth at bedtime as needed for sleep.    . valsartan (DIOVAN) 40 MG tablet Take 1 tablet (40 mg total) by mouth daily. 90 tablet 1  . vitamin C (ASCORBIC ACID) 500 MG tablet Take 1,000 mg by mouth daily.     Marland Kitchen LYRICA 75 MG capsule Take 1 capsule by mouth 2 (two) times daily.    Marland Kitchen gabapentin (NEURONTIN) 300 MG capsule Take 1-2 capsules (300-600 mg total) by mouth at bedtime.     No  facility-administered medications prior to visit.      Per HPI unless specifically indicated in ROS section below Review of Systems Objective:    BP 120/66 (BP Location: Right Arm, Patient Position: Sitting, Cuff Size: Normal)   Pulse 74   Temp 98 F (36.7 C) (Oral)   Ht 5\' 8"  (1.727 m)   Wt 189 lb 2 oz (85.8 kg)   SpO2 95%   BMI 28.76 kg/m   Wt Readings from Last 3 Encounters:  11/29/18 189 lb 2 oz (85.8 kg)  11/07/18 190 lb (86.2 kg)  11/04/18 190 lb (86.2 kg)    Physical Exam Vitals signs and nursing note reviewed.  Constitutional:      Appearance: Normal appearance. He is not ill-appearing.     Comments: Walks with cane due to residual R hemiparesis  Neurological:     Mental Status: He is alert.  Psychiatric:        Mood and Affect: Mood normal.        Behavior: Behavior normal.       Results for orders placed or performed in visit on 10/30/18  TSH  Result Value Ref Range   TSH 4.37 0.35 - 4.50 uIU/mL  T4, free  Result Value Ref Range   Free T4 0.75 0.60 - 1.60 ng/dL  Hemoglobin A1c  Result Value Ref Range   Hgb A1c MFr Bld 6.6 (H) 4.6 - 6.5 %  Basic metabolic panel  Result Value Ref Range   Sodium 137 135 - 145 mEq/L   Potassium 4.0 3.5 - 5.1 mEq/L   Chloride 102 96 - 112 mEq/L   CO2 27 19 - 32 mEq/L   Glucose, Bld 74 70 - 99 mg/dL   BUN 26 (H) 6 - 23 mg/dL   Creatinine, Ser 1.40 0.40 - 1.50 mg/dL   Calcium 9.3 8.4 - 10.5 mg/dL   GFR 48.13 (L) >60.00 mL/min  CBC with Differential/Platelet  Result Value Ref Range   WBC 8.1 4.0 - 10.5 K/uL   RBC 4.53 4.22 - 5.81 Mil/uL   Hemoglobin 13.8 13.0 - 17.0 g/dL   HCT 40.8 39.0 - 52.0 %   MCV 90.2 78.0 - 100.0 fl   MCHC 33.8 30.0 - 36.0 g/dL   RDW 13.6 11.5 - 15.5 %   Platelets 171.0 150.0 - 400.0 K/uL   Neutrophils Relative % 73.4 43.0 - 77.0 %   Lymphocytes Relative 16.0 12.0 - 46.0 %   Monocytes Relative 8.7 3.0 - 12.0 %   Eosinophils Relative 1.4 0.0 - 5.0 %   Basophils  Relative 0.5 0.0 - 3.0 %    Neutro Abs 6.0 1.4 - 7.7 K/uL   Lymphs Abs 1.3 0.7 - 4.0 K/uL   Monocytes Absolute 0.7 0.1 - 1.0 K/uL   Eosinophils Absolute 0.1 0.0 - 0.7 K/uL   Basophils Absolute 0.0 0.0 - 0.1 K/uL  LDL Cholesterol, Direct  Result Value Ref Range   Direct LDL 78.0 mg/dL   Assessment & Plan:  Discussed colon cancer screening. Suggested no further screening at this time, but to let us know if GI symptoms develop.  Problem List Items Addressed This Visit    Type 2 diabetes, controlled, with neuropathy ([place])   Type 2 diabetes mellitus with diabetic nephropathy (HCC)   Restless leg syndrome    Continues mirapex and gabapentin.       Peripheral neuropathy    He has returned to gabapentin 300mg  - he had leftover Rx.      Relevant Medications   gabapentin (NEURONTIN) 300 MG capsule   MDD (major depressive disorder), recurrent episode, moderate (HCC)    Continue cymbalta and trazodone.       Hemiparesis affecting right side as late effect of stroke ([place])   Headache - Primary    Significant improvement since stopping lyrica - will remain off this. Now back on gabapentin.       Relevant Medications   gabapentin (NEURONTIN) 300 MG capsule   CKD (chronic kidney disease) stage 3, GFR 30-59 ml/min (HCC)    Reviewed with patient. Encouraged good hydration status. Will continue to monitor.           No orders of the defined types were placed in this encounter.  No orders of the defined types were placed in this encounter.   Follow up plan: Return if symptoms worsen or fail to improve.  [name], MD

## 2018-11-29 ENCOUNTER — Other Ambulatory Visit: Payer: Self-pay

## 2018-11-29 ENCOUNTER — Encounter: Payer: Self-pay | Admitting: Family Medicine

## 2018-11-29 ENCOUNTER — Ambulatory Visit (INDEPENDENT_AMBULATORY_CARE_PROVIDER_SITE_OTHER): Payer: Medicare Other | Admitting: Family Medicine

## 2018-11-29 VITALS — BP 120/66 | HR 74 | Temp 98.0°F | Ht 68.0 in | Wt 189.1 lb

## 2018-11-29 DIAGNOSIS — I69351 Hemiplegia and hemiparesis following cerebral infarction affecting right dominant side: Secondary | ICD-10-CM

## 2018-11-29 DIAGNOSIS — N183 Chronic kidney disease, stage 3 unspecified: Secondary | ICD-10-CM

## 2018-11-29 DIAGNOSIS — R51 Headache: Secondary | ICD-10-CM

## 2018-11-29 DIAGNOSIS — G2581 Restless legs syndrome: Secondary | ICD-10-CM

## 2018-11-29 DIAGNOSIS — Z794 Long term (current) use of insulin: Secondary | ICD-10-CM | POA: Diagnosis not present

## 2018-11-29 DIAGNOSIS — R519 Headache, unspecified: Secondary | ICD-10-CM

## 2018-11-29 DIAGNOSIS — E1140 Type 2 diabetes mellitus with diabetic neuropathy, unspecified: Secondary | ICD-10-CM | POA: Diagnosis not present

## 2018-11-29 DIAGNOSIS — F331 Major depressive disorder, recurrent, moderate: Secondary | ICD-10-CM

## 2018-11-29 DIAGNOSIS — E1121 Type 2 diabetes mellitus with diabetic nephropathy: Secondary | ICD-10-CM

## 2018-11-29 DIAGNOSIS — G6289 Other specified polyneuropathies: Secondary | ICD-10-CM

## 2018-11-29 NOTE — Assessment & Plan Note (Signed)
Continues mirapex and gabapentin.  °

## 2018-11-29 NOTE — Assessment & Plan Note (Signed)
Significant improvement since stopping lyrica - will remain off this. Now back on gabapentin.

## 2018-11-29 NOTE — Patient Instructions (Addendum)
I do recommend increasing water intake to keep kidneys well hydrated.  I'm glad headaches are better - stay off lyrica and continue gabapentin 300-600mg  at bedtime as needed.  You are doing well today.  Keep appointment in May.

## 2018-11-29 NOTE — Assessment & Plan Note (Addendum)
Reviewed with patient. Encouraged good hydration status. Will continue to monitor.

## 2018-11-29 NOTE — Assessment & Plan Note (Signed)
He has returned to gabapentin 300mg  - he had leftover Rx.

## 2018-11-29 NOTE — Assessment & Plan Note (Signed)
Continue cymbalta and trazodone.

## 2018-12-02 ENCOUNTER — Ambulatory Visit: Payer: Medicare Other | Admitting: Physical Therapy

## 2018-12-02 ENCOUNTER — Encounter: Payer: Self-pay | Admitting: Physical Therapy

## 2018-12-02 DIAGNOSIS — R293 Abnormal posture: Secondary | ICD-10-CM | POA: Diagnosis not present

## 2018-12-02 DIAGNOSIS — R2689 Other abnormalities of gait and mobility: Secondary | ICD-10-CM

## 2018-12-02 DIAGNOSIS — R2681 Unsteadiness on feet: Secondary | ICD-10-CM | POA: Diagnosis not present

## 2018-12-02 DIAGNOSIS — R261 Paralytic gait: Secondary | ICD-10-CM

## 2018-12-02 DIAGNOSIS — R296 Repeated falls: Secondary | ICD-10-CM | POA: Diagnosis not present

## 2018-12-02 DIAGNOSIS — R269 Unspecified abnormalities of gait and mobility: Secondary | ICD-10-CM

## 2018-12-02 DIAGNOSIS — M6281 Muscle weakness (generalized): Secondary | ICD-10-CM

## 2018-12-02 NOTE — Therapy (Signed)
[facility] [address] [place], [name], [address] Phone: [phone]   Fax:  [phone]  Physical Therapy Treatment  Patient Details  Name: James TANORI, James Bell MRN: [id number] Date of Birth: 1930/12/05 Referring Provider (PT): Charlett Blake James Bell   Encounter Date: 12/02/2018  PT End of Session - 12/02/18 1544    Visit Number  3    Number of Visits  26    Date for PT Re-Evaluation  02/17/19    Authorization Type  Medicare & Tricare    Authorization Time Period  Tricare will pick up 100% after Medicare    PT Start Time  0623    PT Stop Time  1530    PT Time Calculation (min)  45 min    Equipment Utilized During Treatment  Gait belt    Activity Tolerance  Patient tolerated treatment well    Behavior During Therapy  [facility] for tasks assessed/performed       Past Medical History:  Diagnosis Date  . Anemia   . Basal cell carcinoma 2018   R temple   . Basal cell carcinoma of nose 2002   bridge of nose  . BPH with obstruction/lower urinary tract symptoms    failed flomax, uroxatral, myrbetriq - sees urology [name]) pending videourodynamics  . CKD (chronic kidney disease) stage 3, GFR 30-59 ml/min (HCC) 03/11/2012   Renal US 02/2014 - multiple kidney cysts   . CVA (cerebral infarction) 03/07/12   slurred speech; right hemplegia, left handed - completed PT 07/2014 (17 sessions)  . Depression    mild, on cymbalta per prior PCP  . Erectile dysfunction    per urology (prostaglandin injection)  . GERD (gastroesophageal reflux disease)    with sliding HH  . H/O hiatal hernia   . History of kidney problems 12/2010   lacerated left kidney after fall  . HLD (hyperlipidemia)   . Kidney cysts 02/2014   bilateral (renal US)  . Migraines 03/11/12   now rare  . OSA on CPAP    sleep study 01/2014 - severe, CPAP at 10, previously on nuvigil (Dr. [name])  . Palpitations 01/2012   holter WNL, rare PAC/PVCs  .  Presbycusis of both ears 2016   rec amplification [facility])  . Restless leg syndrome    well controlled on mirapex  . Rosacea    Kowalski  . TIA (transient ischemic attack)   . Type 2 diabetes, controlled, with neuropathy [facility]) 2011   [name] in [facility]    Past Surgical History:  Procedure Laterality Date  . ABI  2007   WNL  . CARDIAC CATHETERIZATION  2004   normal; [facility]  . CARDIOVASCULAR STRESS TEST  02/2014   WNL, low risk scan, EF 68%  . CAROTID ENDARTERECTOMY    . CATARACT EXTRACTION W/ INTRAOCULAR LENS IMPLANT  11//2012 - 08/2011  . COLONOSCOPY  03/2011   poor prep, unable to biopsy polyp in tic fold, rpt 3 mo [name])  . COLONOSCOPY  06/2011   mult polyps adenomatous, severe diverticulosis, rpt 3 yrs [name])  . CYSTOSCOPY WITH URETHRAL DILATATION N/A 07/24/2013   Procedure: CYSTOSCOPY WITH URETHRAL DILATATION;  Surgeon: [name], James Bell;  Location: [facility];  Service: Urology;  Laterality: N/A;  . ESOPHAGOGASTRODUODENOSCOPY  2006   duodenitis, medual HH, Grade 2 reflux, mild gastritis  . ESOPHAGOGASTRODUODENOSCOPY  07/2011   mild chronic gastritis  . [place]  . [place];  2002   left, then repaired  . MRI brain  04/2010   WNL  . PROSTATE SURGERY  02/2009   PVP (laser)  . SKIN CANCER EXCISION  2002   bridge of nose, BCC  . TONSILLECTOMY AND ADENOIDECTOMY  ~ 1945  . TRANSURETHRAL INCISION OF PROSTATE N/A 07/24/2013   Procedure: TRANSURETHRAL INCISION OF THE PROSTATE (TUIP);  Surgeon: [name], James Bell;  Location: [facility];  Service: Urology;  Laterality: N/A;  . URETHRAL DILATION  2014   tannenbaum    There were no vitals filed for this visit.  Subjective Assessment - 12/02/18 1449    Subjective  Pt reports he is fatigued after working in his garden. He has been having difficulty getting through the [place] at home, has been doing them across two days.     Currently in Pain?  Yes    Pain Score  2     Pain Location  Back     Pain Orientation  Mid    Pain Type  Acute pain    Aggravating Factors   standing (was baking bread this morning)     Pain Relieving Factors  sitting          Balance Exercises - 12/02/18 1453      Balance Exercises: Standing   Other Standing Exercises  stepping on/off towel roll to simulate threshold runner with SBQC, AFO, and CGA x 10 reps       OTAGO PROGRAM   Head Movements  Standing;Other reps (comment)   3   Neck Movements  Standing;Other reps (comment)   3   Back Extension  Standing;Other reps (comment)   3   Trunk Movements  Standing;Other reps (comment)   3   Ankle Movements  Sitting;Other reps (comment)   3   Knee Extensor  Other reps (comment)   3   Knee Flexor  Other reps (comment)   3   Hip ABductor  Other reps (comment)   3   Ankle Plantorflexors  20 reps, support    Ankle Dorsiflexors  20 reps, support    Knee Bends  10 reps, support    Backwards Walking  Support    Walking and Turning Around  --    Sideways Walking  Assistive device    Tandem Stance  10 seconds, support    Tandem Walk  Support    One Leg Stand  30 seconds, no support   modified with forefoot in counter, hoovering hands   Heel Walking  Support    Toe Walk  Support    Sit to Stand  5 reps, one support   5 R support, 5 L support    Overall OTAGO Comments  warm up marching holding counter, seated hamstring & DF stretches        PT Education - 12/02/18 1535    Education Details  advised pt to break up otago throughout the day; stressed importance of completely all exercises in book (even if it takes 2 days), rather than picking favorites    Person(s) Educated  Patient    Methods  Explanation    Comprehension  Verbalized understanding       PT Short Term Goals - 11/19/18 1447      PT SHORT TERM GOAL #1   Title  Patient demostrates understanding of updated HEP. (All STGs Target Date: 12/20/2018)    Time  1    Period  Months    Status  New    Target  Date  12/20/18      PT  SHORT TERM GOAL #2   Title  Patient ambulates 300' with [facility] & AFO with supervision.     Time  1    Period  Months    Status  New    Target Date  12/20/18      PT SHORT TERM GOAL #3   Title  Patient negotiates ramps & curbs with [facility] with supervision.     Time  1    Period  Months    Status  New    Target Date  12/20/18      PT SHORT TERM GOAL #4   Title  Timed Up & Go with SBQC <20 seconds.    Time  1    Period  Months    Status  New    Target Date  12/20/18        PT Long Term Goals - 11/19/18 1443      PT LONG TERM GOAL #1   Title  Patient verbalizes & demonstrates understanding of ongoing HEP / fitness plan. (All LTGs Target Date: 02/14/2019)    Time  3    Period  Months    Status  New    Target Date  02/14/19      PT LONG TERM GOAL #2   Title  Gait Velocity with SBQC comfortable pace >/= 2.11 ft/sec and fast >/= 2.50 ft/sec    Time  3    Period  Months    Status  New    Target Date  02/14/19      PT LONG TERM GOAL #3   Title  Timed Up & Go with SBQC <15 seconds to indicate lower fall risk.     Time  3    Period  Months    Status  New    Target Date  02/14/19      PT LONG TERM GOAL #4   Title  Dynamic Gait Index with [facility] >12/24 to indicate lower fall risk    Time  3    Period  Months    Status  New    Target Date  02/14/19      PT LONG TERM GOAL #5   Title  Patient negotiates ramps & curbs with SBQC modified independent.     Time  3    Period  Months    Status  New    Target Date  02/14/19          Plan - 12/02/18 1545    Clinical Impression Statement  Patient demonstrates understanding of OTAGO HEP introduced last session, requiring minimal visual and verbal cueing for technique and posture to complete exercises. Rest of session focused on stepping on/off of towel roll to simulate threshold runner. Pt demonstrates improvements with [place], but continues to demonstrate unsteadiness with functional mobility and will continue to benefit from ongoing  PT. Continue with PT POC.    Personal Factors and Comorbidities  Age;Comorbidity 3+;Time since onset of injury/illness/exacerbation;Other   lives alone   Comorbidities  CVA, DM, neuropathy, HTN, restless leg syndrome    Examination-Activity Limitations  Lift;Locomotion Level;Stairs;Stand;Other   balance   Examination-Participation Restrictions  Community Activity    Stability/Clinical Decision Making  Evolving/Moderate complexity    Rehab Potential  Good    PT Frequency  2x / week    PT Duration  12 weeks    PT Treatment/Interventions  ADLs/Self Care Home Management;DME Instruction;Gait training;Stair training;Functional mobility training;Therapeutic  activities;Therapeutic exercise;Balance training;Neuromuscular re-education;Patient/family education;Orthotic Fit/Training;Vestibular    PT Next Visit Plan  warm up with towel roll step ups, ramp & curb navigation, simulate doorway threshold runner navigation, gait training     Consulted and Agree with Plan of Care  Patient       Patient will benefit from skilled therapeutic intervention in order to improve the following deficits and impairments:  Abnormal gait, Decreased activity tolerance, Decreased balance, Decreased coordination, Decreased endurance, Decreased mobility, Decreased range of motion, Decreased strength, Impaired tone, Impaired flexibility, Postural dysfunction, Other (comment)(orthotic dependency)  Visit Diagnosis: Unsteadiness on feet  Muscle weakness (generalized)  Paralytic gait  Other abnormalities of gait and mobility  Abnormal posture  Repeated falls  Unsteadiness  Muscle weakness  Abnormality of gait     Problem List Patient Active Problem List   Diagnosis Date Noted  . Type 2 diabetes mellitus with diabetic nephropathy ([place]) 11/29/2018  . Headache 10/30/2018  . Peripheral neuropathy 01/15/2018  . Left wrist pain 01/15/2018  . Rosacea   . Gait disturbance, post-stroke 10/05/2015  . Presbycusis of  both ears   . Advanced care planning/counseling discussion 06/23/2015  . Impaired mobility 04/23/2015  . HTN (hypertension) 03/18/2015  . Chronic diastolic heart failure, NYHA class 1 ([place]) 03/18/2015  . Spastic hemiparesis affecting dominant side ([place]) 07/07/2014  . Chest pressure 02/03/2014  . Medicare annual wellness visit, subsequent 11/06/2013  . Erectile dysfunction due to arterial insufficiency 02/05/2013  . MDD (major depressive disorder), recurrent episode, moderate ([place])   . Palpitations 06/26/2012  . HLD (hyperlipidemia)   . Restless leg syndrome   . GERD (gastroesophageal reflux disease)   . BPH with obstruction/lower urinary tract symptoms   . History of CVA (cerebrovascular accident) 03/11/2012  . Hemiparesis affecting right side as late effect of stroke ([place]) 03/11/2012  . Type 2 diabetes, controlled, with neuropathy ([place]) 03/11/2012  . OSA on CPAP 03/11/2012  . CKD (chronic kidney disease) stage 3, GFR 30-59 ml/min (HCC) 03/11/2012  . Migraines 03/11/2012    Joaquin Courts, SPT 12/02/2018, 3:54 PM  [facility] [address] [place], [name], [address] Phone: [phone]   Fax:  [phone]  Name: James SCHOEPPNER, James Bell MRN: [id number] Date of Birth: April 06, 1931

## 2018-12-05 ENCOUNTER — Ambulatory Visit: Payer: Medicare Other | Admitting: Physical Therapy

## 2018-12-09 ENCOUNTER — Ambulatory Visit: Payer: Medicare Other | Admitting: Physical Therapy

## 2018-12-09 ENCOUNTER — Encounter: Payer: Self-pay | Admitting: Physical Therapy

## 2018-12-09 NOTE — Therapy (Signed)
[facility] [address] [place], [name], [address] Phone: [phone]   Fax:  [phone]  Patient Details  Name: James WAGES, MD MRN: [id number] Date of Birth: [birth year] Referring Provider:  No ref. provider found  Encounter Date: 12/09/2018  Dr. Jinny Sanders was contacted today regarding the temporary closing of [facility] due to Covid-19.  Therapist discussed:  Opportunity for E-visits/telehealth.  Patient is not interested in further information for an e-visit, virtual check in, or telehealth visit, if those services become available.    [facility] will follow up with patients when we are able to resume care.  Jamey Reas, PT, [facility] [address] [place], [place]  [address] Phone:  [phone] Fax:  [phone] Jamey Reas PT, DPT 12/09/2018, 12:28 PM  [facility] [address] [place], [name], [address] Phone: [phone]   Fax:  [phone]

## 2018-12-11 ENCOUNTER — Ambulatory Visit: Payer: Medicare Other | Admitting: Physical Therapy

## 2018-12-12 ENCOUNTER — Other Ambulatory Visit: Payer: Self-pay | Admitting: Family Medicine

## 2018-12-12 ENCOUNTER — Ambulatory Visit: Payer: Medicare Other | Admitting: Physical Therapy

## 2018-12-12 NOTE — Telephone Encounter (Signed)
Gabapentin Last filled:  08/27/18, #270 Last OV:  11/29/18, f/u Next OV:  01/27/19, AWV

## 2018-12-16 ENCOUNTER — Ambulatory Visit: Payer: Medicare Other | Admitting: Physical Therapy

## 2018-12-18 ENCOUNTER — Ambulatory Visit: Payer: Medicare Other | Admitting: Physical Therapy

## 2018-12-19 ENCOUNTER — Ambulatory Visit: Payer: Medicare Other | Admitting: Physical Therapy

## 2018-12-21 ENCOUNTER — Other Ambulatory Visit: Payer: Self-pay | Admitting: Family Medicine

## 2018-12-23 ENCOUNTER — Encounter: Payer: Medicare Other | Attending: Physical Medicine & Rehabilitation

## 2018-12-23 ENCOUNTER — Encounter: Payer: Self-pay | Admitting: Physical Medicine & Rehabilitation

## 2018-12-23 ENCOUNTER — Ambulatory Visit: Payer: Medicare Other | Admitting: Physical Therapy

## 2018-12-23 ENCOUNTER — Ambulatory Visit (HOSPITAL_BASED_OUTPATIENT_CLINIC_OR_DEPARTMENT_OTHER): Payer: Medicare Other | Admitting: Physical Medicine & Rehabilitation

## 2018-12-23 ENCOUNTER — Other Ambulatory Visit: Payer: Self-pay

## 2018-12-23 VITALS — HR 72 | Ht 68.0 in | Wt 190.0 lb

## 2018-12-23 DIAGNOSIS — R269 Unspecified abnormalities of gait and mobility: Secondary | ICD-10-CM

## 2018-12-23 DIAGNOSIS — I69398 Other sequelae of cerebral infarction: Secondary | ICD-10-CM | POA: Insufficient documentation

## 2018-12-23 DIAGNOSIS — I69351 Hemiplegia and hemiparesis following cerebral infarction affecting right dominant side: Secondary | ICD-10-CM

## 2018-12-23 NOTE — Progress Notes (Signed)
Subjective:  This is a WebEx visit  Patient ID: James Spurr, MD, male    DOB: [birth year], 85 y.o.   MRN: [id number] Admitted March 11, 2012, with progressive  right-sided weakness. MRI revealed moderate-size acute infarct  affecting the left posterior putamen and left periventricular white  matter. MRA of the head was unremarkable. Carotid Dopplers with no ICA  stenosis. Echocardiogram with ejection fraction of 65% and grade 1  diastolic dysfunction. Neurology consulted, placed on Plavix  Last visit was 11/07/2018 at which time pt was complaining of overall weakness.  Pt had medical w/u per PCP and Imaging per Neuro, neither of which showed any new abnormality The pt was treated for depression with duloxetine 60mg  per day He was referred to outpt PT, Had 3 sessions with     HPI Last visit was 11/07/2018 at which time pt was complaining of overall weakness.  Pt had medical w/u per PCP and Imaging per Neuro, neither of which showed any new abnormality The pt was treated for depression with duloxetine 60mg  per day He was referred to outpt PT, Had 3 sessions with PT introduction to OTAGO HEP  No further Falls,  Still using 4 point quad cane.  No new medical issues, has not changed from Lyrica to Gabapentin Has 300mg  tablets advised to take 3 times per day in place of Lyrica He does have good pain relief of his diabetic neuropathy with the Lyrica.  There was some concern that this may have been causing some sedation.  We discussed that the gabapentin may have fewer sedation related effects than the Lyrica Pain Inventory Average Pain 0 Pain Right Now 0 My pain is no pain  In the last 24 hours, has pain interfered with the following? General activity 0 Relation with others 0 Enjoyment of life 0 What TIME of day is your pain at its worst? no pain Sleep (in general) Fair  Pain is worse with: no pain Pain improves with: no pain Relief from Meds: no pain  Mobility walk with  assistance use a cane  Function retired  Neuro/Psych weakness trouble walking dizziness  Prior Studies Any changes since last visit?  no  Physicians involved in your care Any changes since last visit?  no   Family History  Problem Relation Age of Onset  . Stroke Father   . Lung cancer Father 33        smoker  . Stroke Brother   . Diabetes Brother 52  . Lung cancer Brother   . Alcoholism Sister   . Kidney disease Sister   . Coronary artery disease Neg Hx    Social History   Socioeconomic History  . Marital status: Unknown    Spouse name: Not on file  . Number of children: 2  . Years of education: MD  . Highest education level: Not on file  Occupational History  . Occupation: retired    Fish farm manager: RETIRED  Social Needs  . Financial resource strain: Not on file  . Food insecurity:    Worry: Not on file    Inability: Not on file  . Transportation needs:    Medical: Not on file    Non-medical: Not on file  Tobacco Use  . Smoking status: Never Smoker  . Smokeless tobacco: Never Used  . Tobacco comment: "used to smoke pipe when I was younger"  Substance and Sexual Activity  . Alcohol use: Yes    Alcohol/week: 4.0 standard drinks    Types: 2  Glasses of wine, 2 Shots of liquor per week    Comment: occasional  . Drug use: No  . Sexual activity: Yes  Lifestyle  . Physical activity:    Days per week: Not on file    Minutes per session: Not on file  . Stress: Not on file  Relationships  . Social connections:    Talks on phone: Not on file    Gets together: Not on file    Attends religious service: Not on file    Active member of club or organization: Not on file    Attends meetings of clubs or organizations: Not on file    Relationship status: Not on file  Other Topics Concern  . Not on file  Social History Narrative   Caffeine: 1 cup coffee/day    Occupation: ophthalmologist, retired   Edu: MD   Activity: going to gym 3x/wk with trainer   Diet: some  water, fruits/vegetables some, red meat a few times, fish 1x/wk   POA - [name] (Daughter)   Past Surgical History:  Procedure Laterality Date  . ABI  2007   WNL  . CARDIAC CATHETERIZATION  2004   normal; [facility]  . CARDIOVASCULAR STRESS TEST  02/2014   WNL, low risk scan, EF 68%  . CAROTID ENDARTERECTOMY    . CATARACT EXTRACTION W/ INTRAOCULAR LENS IMPLANT  11//2012 - 08/2011  . COLONOSCOPY  03/2011   poor prep, unable to biopsy polyp in tic fold, rpt 3 mo [name])  . COLONOSCOPY  06/2011   mult polyps adenomatous, severe diverticulosis, rpt 3 yrs [name])  . CYSTOSCOPY WITH URETHRAL DILATATION N/A 07/24/2013   Procedure: CYSTOSCOPY WITH URETHRAL DILATATION;  Surgeon: [name], MD;  [facility];  Service: Urology;  Laterality: N/A;  . ESOPHAGOGASTRODUODENOSCOPY  2006   duodenitis, medual HH, Grade 2 reflux, mild gastritis  . ESOPHAGOGASTRODUODENOSCOPY  07/2011   mild chronic gastritis  . [place]  . [place]; 2002   left, then repaired  . MRI brain  04/2010   WNL  . PROSTATE SURGERY  02/2009   PVP (laser)  . SKIN CANCER EXCISION  2002   bridge of nose, BCC  . TONSILLECTOMY AND ADENOIDECTOMY  ~ 1945  . TRANSURETHRAL INCISION OF PROSTATE N/A 07/24/2013   Procedure: TRANSURETHRAL INCISION OF THE PROSTATE (TUIP);  Surgeon: [name], MD;  Location[facility];  Service: Urology;  Laterality: N/A;  . URETHRAL DILATION  2014   tannenbaum   Past Medical History:  Diagnosis Date  . Anemia   . Basal cell carcinoma 2018   R temple   . Basal cell carcinoma of nose 2002   bridge of nose  . BPH with obstruction/lower urinary tract symptoms    failed flomax, uroxatral, myrbetriq - sees urology [name]) pending videourodynamics  . CKD (chronic kidney disease) stage 3, GFR 30-59 ml/min (HCC) 03/11/2012   Renal US 02/2014 - multiple kidney cysts   . CVA (cerebral infarction) 03/07/12   slurred speech; right hemplegia,  left handed - completed PT 07/2014 (17 sessions)  . Depression    mild, on cymbalta per prior PCP  . Erectile dysfunction    per urology (prostaglandin injection)  . GERD (gastroesophageal reflux disease)    with sliding HH  . H/O hiatal hernia   . History of kidney problems 12/2010   lacerated left kidney after fall  . HLD (hyperlipidemia)   . Kidney cysts 02/2014  bilateral (renal US)  . Migraines 03/11/12   now rare  . OSA on CPAP    sleep study 01/2014 - severe, CPAP at 10, previously on nuvigil (Dr. [name])  . Palpitations 01/2012   holter WNL, rare PAC/PVCs  . Presbycusis of both ears 2016   rec amplification [facility])  . Restless leg syndrome    well controlled on mirapex  . Rosacea    [place]  . TIA (transient ischemic attack)   . Type 2 diabetes, controlled, with neuropathy ([place]) 2011   [name] in [place]   Pulse 72 Comment: patient reported  Ht 5\' 8"  (1.727 m)   Wt 190 lb (86.2 kg)   SpO2 99%   BMI 28.89 kg/m   Opioid Risk Score:   Fall Risk Score:  `1  Depression screen PHQ 2/9  Depression screen [facility] 2/9 03/08/2018 12/14/2017 07/18/2017 07/17/2016 01/17/2016 10/05/2015 06/23/2015  Decreased Interest 0 0 0 0 0 0 0  Down, Depressed, Hopeless 1 1 1  0 0 0 0  PHQ - 2 Score 1 1 1  0 0 0 0  Altered sleeping - - 2 - - - -  Tired, decreased energy - - 2 - - - -  Change in appetite - - 1 - - - -  Feeling bad or failure about yourself  - - 1 - - - -  Trouble concentrating - - 1 - - - -  Moving slowly or fidgety/restless - - 0 - - - -  Suicidal thoughts - - 0 - - - -  PHQ-9 Score - - 8 - - - -  Some recent data might be hidden   Review of Systems  Constitutional: Negative.   HENT: Negative.   Eyes: Negative.   Respiratory: Negative.   Gastrointestinal: Negative.   Endocrine: Negative.   Genitourinary: Negative.   Musculoskeletal: Positive for gait problem.  Skin: Negative.   Neurological: Positive for dizziness.  Psychiatric/Behavioral: Negative.         Objective:   Physical Exam Speech without evidence of dysarthria Physical examination deferred secondary to virtual visit       Assessment & Plan:  #1.  Left subcortical infarct with resultant chronic right hemiparesis, gait disorder. His spasticity has been controlled with just a stretching program no longer requiring botulinum toxin injections. He was referred to therapy for some decline in function as well as falls.  Because of the coronavirus outbreak his therapy schedule has been curtailed and he hopes to resume as soon as possible. We discussed that he should keep up with his home exercise program on a consistent basis he currently does this every other day which is adequate. I will see him back in approximately 3 months

## 2018-12-25 ENCOUNTER — Ambulatory Visit: Payer: Medicare Other | Admitting: Physical Therapy

## 2018-12-30 ENCOUNTER — Ambulatory Visit: Payer: Medicare Other | Admitting: Physical Therapy

## 2019-01-01 DIAGNOSIS — Z85828 Personal history of other malignant neoplasm of skin: Secondary | ICD-10-CM

## 2019-01-02 ENCOUNTER — Ambulatory Visit: Payer: Medicare Other | Admitting: Physical Therapy

## 2019-01-06 ENCOUNTER — Ambulatory Visit: Payer: Medicare Other | Admitting: Physical Therapy

## 2019-01-08 ENCOUNTER — Ambulatory Visit: Payer: Medicare Other | Admitting: Physical Therapy

## 2019-01-09 ENCOUNTER — Other Ambulatory Visit: Payer: Self-pay | Admitting: Family Medicine

## 2019-01-09 ENCOUNTER — Ambulatory Visit: Payer: Medicare Other | Admitting: Physical Therapy

## 2019-01-09 NOTE — Telephone Encounter (Signed)
Duloxetine Last filled:  11/03/18, #90 Last OV:  11/29/18, f/u Next OV:  01/27/19, CPE Pt 2

## 2019-01-11 ENCOUNTER — Other Ambulatory Visit: Payer: Self-pay | Admitting: Family Medicine

## 2019-01-11 DIAGNOSIS — K219 Gastro-esophageal reflux disease without esophagitis: Secondary | ICD-10-CM

## 2019-01-13 ENCOUNTER — Ambulatory Visit: Payer: Medicare Other | Admitting: Neurology

## 2019-01-13 ENCOUNTER — Ambulatory Visit: Payer: Medicare Other | Admitting: Physical Therapy

## 2019-01-13 NOTE — Telephone Encounter (Signed)
Plavix Last filled:  11/05/18, #90 Last OV:  3./13/20, f/u Next OV:  01/27/19, CPE Pt 2

## 2019-01-14 ENCOUNTER — Ambulatory Visit: Payer: Medicare Other | Admitting: Neurology

## 2019-01-14 MED ORDER — METOPROLOL TARTRATE 25 MG PO TABS: 12.5000 mg | ORAL_TABLET | Freq: Every day | ORAL | 1 refills | Status: DC

## 2019-01-14 MED ORDER — PANTOPRAZOLE SODIUM 40 MG PO TBEC: 40.0000 mg | DELAYED_RELEASE_TABLET | Freq: Every day | ORAL | 3 refills | Status: DC

## 2019-01-14 NOTE — Addendum Note (Signed)
Addended by: Brenton Grills on: 1/58/3094 07:68 PM   Modules accepted: Orders

## 2019-01-14 NOTE — Telephone Encounter (Signed)
Spoke with pt relaying Dr. Synthia Innocent message. Pt verbalizes understanding.  Also, requests refill for metoprolol.  E-scribed refill

## 2019-01-14 NOTE — Telephone Encounter (Signed)
Sent in.  plz notify patient - I would like to switch omeprazole to pantoprazole 40mg  daily for less plavix interaction.

## 2019-01-16 ENCOUNTER — Encounter: Payer: Medicare Other | Admitting: Family Medicine

## 2019-01-16 ENCOUNTER — Ambulatory Visit: Payer: Medicare Other | Admitting: Physical Therapy

## 2019-01-20 ENCOUNTER — Ambulatory Visit: Payer: Medicare Other | Admitting: Physical Therapy

## 2019-01-21 ENCOUNTER — Ambulatory Visit: Payer: Medicare Other

## 2019-01-22 ENCOUNTER — Ambulatory Visit (INDEPENDENT_AMBULATORY_CARE_PROVIDER_SITE_OTHER): Payer: Medicare Other

## 2019-01-22 VITALS — BP 130/72 | HR 71

## 2019-01-22 DIAGNOSIS — Z0000 Encounter for general adult medical examination without abnormal findings: Secondary | ICD-10-CM | POA: Diagnosis not present

## 2019-01-22 NOTE — Progress Notes (Signed)
PCP notes:   Health maintenance:  Foot exam - postponed  Abnormal screenings:   Mini-Cog score: 16/17 MMSE - Mini Mental State Exam 01/22/2019 07/17/2016  Orientation to time 5 5  Orientation to Place 5 5  Registration 3 3  Attention/ Calculation 0 0  Recall 2 2  Recall-comments unable to recall 1 of 3 words pt was unable to recall 1 of 3 words  Language- name 2 objects 0 0  Language- repeat 1 1  Language- follow 3 step command 0 3  Language- read & follow direction 0 0  Write a sentence 0 0  Copy design 0 0  Total score 16 19    Patient concerns:   None  Nurse concerns:  None  Next PCP appt:   01/27/19 @ 0930

## 2019-01-22 NOTE — Patient Instructions (Signed)
James Bell , Thank you for taking time to come for your Medicare Wellness Visit. I appreciate your ongoing commitment to your health goals. Please review the following plan we discussed and let me know if I can assist you in the future.   These are the goals we discussed: Goals    . Patient Stated     Starting 01/22/19, I will continue to take medications as prescribed.        This is a list of the screening recommended for you and due dates:  Health Maintenance  Topic Date Due  . Complete foot exam   09/17/2020*  . DTaP/Tdap/Td vaccine (1 - Tdap) 07/18/2026*  . Flu Shot  04/19/2019  . Hemoglobin A1C  04/30/2019  . Eye exam for diabetics  10/23/2019  . Tetanus Vaccine  07/17/2026  . Pneumonia vaccines  Completed  *Topic was postponed. The date shown is not the original due date.   Preventive Care for Adults  A healthy lifestyle and preventive care can promote health and wellness. Preventive health guidelines for adults include the following key practices.  . A routine yearly physical is a good way to check with your health care provider about your health and preventive screening. It is a chance to share any concerns and updates on your health and to receive a thorough exam.  . Visit your dentist for a routine exam and preventive care every 6 months. Brush your teeth twice a day and floss once a day. Good oral hygiene prevents tooth decay and gum disease.  . The frequency of eye exams is based on your age, health, family medical history, use  of contact lenses, and other factors. Follow your health care provider's recommendations for frequency of eye exams.  . Eat a healthy diet. Foods like vegetables, fruits, whole grains, low-fat dairy products, and lean protein foods contain the nutrients you need without too many calories. Decrease your intake of foods high in solid fats, added sugars, and salt. Eat the right amount of calories for you. Get information about a proper diet from  your health care provider, if necessary.  . Regular physical exercise is one of the most important things you can do for your health. Most adults should get at least 150 minutes of moderate-intensity exercise (any activity that increases your heart rate and causes you to sweat) each week. In addition, most adults need muscle-strengthening exercises on 2 or more days a week.  Silver Sneakers may be a benefit available to you. To determine eligibility, you may visit the website: www.silversneakers.com or contact program at (806)699-7224 Mon-Fri between 8AM-8PM.   . Maintain a healthy weight. The body mass index (BMI) is a screening tool to identify possible weight problems. It provides an estimate of body fat based on height and weight. Your health care provider can find your BMI and can help you achieve or maintain a healthy weight.   For adults 20 years and older: ? A BMI below 18.5 is considered underweight. ? A BMI of 18.5 to 24.9 is normal. ? A BMI of 25 to 29.9 is considered overweight. ? A BMI of 30 and above is considered obese.   . Maintain normal blood lipids and cholesterol levels by exercising and minimizing your intake of saturated fat. Eat a balanced diet with plenty of fruit and vegetables. Blood tests for lipids and cholesterol should begin at age 44 and be repeated every 5 years. If your lipid or cholesterol levels are high, you are over  50, or you are at high risk for heart disease, you may need your cholesterol levels checked more frequently. Ongoing high lipid and cholesterol levels should be treated with medicines if diet and exercise are not working.  . If you smoke, find out from your health care provider how to quit. If you do not use tobacco, please do not start.  . If you choose to drink alcohol, please do not consume more than 2 drinks per day. One drink is considered to be 12 ounces (355 mL) of beer, 5 ounces (148 mL) of wine, or 1.5 ounces (44 mL) of liquor.  . If you  are 39-69 years old, ask your health care provider if you should take aspirin to prevent strokes.  . Use sunscreen. Apply sunscreen liberally and repeatedly throughout the day. You should seek shade when your shadow is shorter than you. Protect yourself by wearing long sleeves, pants, a wide-brimmed hat, and sunglasses year round, whenever you are outdoors.  . Once a month, do a whole body skin exam, using a mirror to look at the skin on your back. Tell your health care provider of new moles, moles that have irregular borders, moles that are larger than a pencil eraser, or moles that have changed in shape or color.

## 2019-01-22 NOTE — Progress Notes (Signed)
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  Review of Systems:  N/A Cardiac Risk Factors include: advanced age (>33men, >15 women);diabetes mellitus;dyslipidemia;hypertension;male gender     Objective:    Vitals: BP 130/72 Comment: patient supplied  Pulse 71 Comment: patient supplied  SpO2 96% Comment: patient supplied  There is no height or weight on file to calculate BMI.  Advanced Directives 01/22/2019 11/19/2018 09/26/2017 05/14/2017 04/02/2017 02/19/2017 02/19/2017  Does Patient Have a Medical Advance Directive? Yes - Yes Yes Yes Yes Yes  Type of Paramedic of [place];Living will [place];Living will [place];Living will [place];Living will [place];Living will [place];Living will [place]  Does patient want to make changes to medical advance directive? - - - - - - -  Copy of [place] in Chart? No - copy requested Yes - validated most recent copy scanned in chart (See row information) - Yes - Yes Yes    Tobacco Social History   Tobacco Use  Smoking Status Never Smoker  Smokeless Tobacco Never Used  Tobacco Comment   "used to smoke pipe when I was younger"     Counseling given: No Comment: "used to smoke pipe when I was younger"   Clinical Intake:  Pre-visit preparation completed: Yes  Pain : No/denies pain Pain Score: 0-No pain     Nutritional Status: BMI 25 -29 Overweight Nutritional Risks: None Diabetes: Yes CBG done?: No Did pt. bring in CBG monitor from home?: No  How often do you need to have someone help you when you read instructions, pamphlets, or other written materials from your doctor or pharmacy?: 1 - Never What is the last grade level you completed in school?: Doctorate  Interpreter Needed?: No  Comments: pt  lives independently Information entered by :: LPinson, LPN  Past Medical History:  Diagnosis Date  . Anemia   . Basal cell carcinoma 2018   R [place]   . Basal cell carcinoma of nose 2002   bridge of nose  . BPH with obstruction/lower urinary tract symptoms    failed flomax, uroxatral, myrbetriq - sees urology [name]) pending videourodynamics  . CKD (chronic kidney disease) stage 3, GFR 30-59 ml/min (HCC) 03/11/2012   Renal US 02/2014 - multiple kidney cysts   . CVA (cerebral infarction) 03/07/12   slurred speech; right hemplegia, left handed - completed PT 07/2014 (17 sessions)  . Depression    mild, on cymbalta per prior PCP  . Erectile dysfunction    per urology (prostaglandin injection)  . GERD (gastroesophageal reflux disease)    with sliding HH  . H/O hiatal hernia   . History of kidney problems 12/2010   lacerated left kidney after fall  . HLD (hyperlipidemia)   . Hx of basal cell carcinoma 07/10/2017   R. lat. forehead near hair line  . Kidney cysts 02/2014   bilateral (renal US)  . Migraines 03/11/12   now rare  . OSA on CPAP    sleep study 01/2014 - severe, CPAP at 10, previously on nuvigil (Dr. [name])  . Palpitations 01/2012   holter WNL, rare PAC/PVCs  . Presbycusis of both ears 2016   rec amplification [facility])  . Restless leg syndrome    well controlled on mirapex  . Rosacea    [place]  . TIA (transient ischemic attack)   .  Type 2 diabetes, controlled, with neuropathy [facility]) 2011   [name] in [facility]   Past Surgical History:  Procedure Laterality Date  . ABI  2007   WNL  . CARDIAC CATHETERIZATION  2004   normal; [place]  . CARDIOVASCULAR STRESS TEST  02/2014   WNL, low risk scan, EF 68%  . CAROTID ENDARTERECTOMY    . CATARACT EXTRACTION W/ INTRAOCULAR LENS IMPLANT  11//2012 - 08/2011  . COLONOSCOPY  03/2011   poor prep, unable to biopsy polyp in tic fold, rpt 3 mo [name])  . COLONOSCOPY  06/2011   mult polyps adenomatous, severe diverticulosis,  rpt 3 yrs [name])  . CYSTOSCOPY WITH URETHRAL DILATATION N/A 07/24/2013   Procedure: CYSTOSCOPY WITH URETHRAL DILATATION;  Surgeon: [name], MD;  Location: [facility];  Service: Urology;  Laterality: N/A;  . ESOPHAGOGASTRODUODENOSCOPY  2006   duodenitis, medual HH, Grade 2 reflux, mild gastritis  . ESOPHAGOGASTRODUODENOSCOPY  07/2011   mild chronic gastritis  . [place]  . [place]; 2002   left, then repaired  . MRI brain  04/2010   WNL  . PROSTATE SURGERY  02/2009   PVP (laser)  . SKIN CANCER EXCISION  2002   bridge of nose, BCC  . TONSILLECTOMY AND ADENOIDECTOMY  ~ 1945  . TRANSURETHRAL INCISION OF PROSTATE N/A 07/24/2013   Procedure: TRANSURETHRAL INCISION OF THE PROSTATE (TUIP);  Surgeon: [name], MD;  Location: [facility];  Service: Urology;  Laterality: N/A;  . URETHRAL DILATION  2014   tannenbaum   Family History  Problem Relation Age of Onset  . Stroke Father   . Lung cancer Father 32        smoker  . Stroke Brother   . Diabetes Brother 65  . Lung cancer Brother   . Alcoholism Sister   . Kidney disease Sister   . Coronary artery disease Neg Hx    Social History   Socioeconomic History  . Marital status: Unknown    Spouse name: Not on file  . Number of children: 2  . Years of education: MD  . Highest education level: Not on file  Occupational History  . Occupation: retired    Fish farm manager: RETIRED  Social Needs  . Financial resource strain: Not on file  . Food insecurity:    Worry: Not on file    Inability: Not on file  . Transportation needs:    Medical: Not on file    Non-medical: Not on file  Tobacco Use  . Smoking status: Never Smoker  . Smokeless tobacco: Never Used  . Tobacco comment: "used to smoke pipe when I was younger"  Substance and Sexual Activity  . Alcohol use: Yes    Alcohol/week: 4.0 standard drinks    Types: 2 Glasses of wine, 2 Shots of liquor per week    Comment: occasional  . Drug  use: No  . Sexual activity: Yes  Lifestyle  . Physical activity:    Days per week: Not on file    Minutes per session: Not on file  . Stress: Not on file  Relationships  . Social connections:    Talks on phone: Not on file    Gets together: Not on file    Attends religious service: Not on file    Active member of club or organization: Not on file    Attends meetings of clubs or organizations: Not on file    Relationship status: Not on  file  Other Topics Concern  . Not on file  Social History Narrative   Caffeine: 1 cup coffee/day    Occupation: ophthalmologist, retired   Edu: MD   Activity: going to gym 3x/wk with trainer   Diet: some water, fruits/vegetables some, red meat a few times, fish 1x/wk   POA - [name] (Daughter)    Outpatient Encounter Medications as of 01/22/2019  Medication Sig  . capsaicin (ZOSTRIX) 0.025 % cream Apply topically 2 (two) times daily.  . clopidogrel (PLAVIX) 75 MG tablet TAKE 1 TABLET DAILY  . Coenzyme Q10 (COQ10) 150 MG CAPS Take 1 capsule by mouth 2 (two) times daily.  . CRESTOR 40 MG tablet TAKE 1 TABLET DAILY  . diazepam (VALIUM) 5 MG tablet Take 1 tablet (5 mg total) by mouth every 8 (eight) hours as needed for muscle spasms.  . DULoxetine (CYMBALTA) 60 MG capsule TAKE 1 CAPSULE DAILY  . FOLBEE 2.5-25-1 MG TABS tablet TAKE 1 TABLET DAILY  . gabapentin (NEURONTIN) 300 MG capsule TAKE 1 CAPSULE IN THE MORNING AND 2 CAPSULES AT NIGHT  . Insulin Degludec (TRESIBA FLEXTOUCH) 200 UNIT/ML SOPN Inject 60 Units into the skin at bedtime.   . Insulin Pen Needle (NOVOFINE) 30G X 8 MM MISC Use to inject insulin once daily dx:E11.40  . levothyroxine (LEVOTHROID) 25 MCG tablet Take 1 tablet (25 mcg total) by mouth daily before breakfast. Per endo [name]  . metoprolol tartrate (LOPRESSOR) 25 MG tablet Take 0.5 tablets (12.5 mg total) by mouth at bedtime. With AM dose if needed  . metroNIDAZOLE (METROCREAM) 0.75 % cream Apply 1 application topically 3  (three) times daily.  . nitroGLYCERIN (NITROSTAT) 0.4 MG SL tablet DISSOLVE 1 TABLET UNDER THE TONGUE EVERY 5 MINUTES AS NEEDED FOR CHEST PAIN  . omega-3 acid ethyl esters (LOVAZA) 1 g capsule Take 2 capsules (2 g total) by mouth 2 (two) times daily.  . pantoprazole (PROTONIX) 40 MG tablet Take 1 tablet (40 mg total) by mouth daily.  . pramipexole (MIRAPEX) 1 MG tablet TAKE 2 TABLETS AT 3 P.M. AND 2 TABLETS AT BEDTIME AS NEEDED  . Semaglutide (OZEMPIC) 0.25 or 0.5 MG/DOSE SOPN Inject into the skin.  . valsartan (DIOVAN) 40 MG tablet Take 1 tablet (40 mg total) by mouth daily.  . vitamin C (ASCORBIC ACID) 500 MG tablet Take 1,000 mg by mouth daily.   . diclofenac sodium (VOLTAREN) 1 % GEL APPLY 2 GRAMS TOPICALLY FOUR TIMES A DAY (Patient not taking: Reported on 01/22/2019)  . ibuprofen (ADVIL,MOTRIN) 200 MG tablet Take 400 mg by mouth every 6 (six) hours as needed for mild pain or moderate pain.  . traZODone (DESYREL) 50 MG tablet Take 50 mg by mouth at bedtime as needed for sleep.   No facility-administered encounter medications on file as of 01/22/2019.     Activities of Daily Living In your present state of health, do you have any difficulty performing the following activities: 01/22/2019  Hearing? Y  Vision? N  Difficulty concentrating or making decisions? N  Walking or climbing stairs? N  Dressing or bathing? N  Doing errands, shopping? N  Preparing Food and eating ? N  Using the Toilet? N  In the past six months, have you accidently leaked urine? N  Do you have problems with loss of bowel control? N  Managing your Medications? N  Managing your Finances? N  Housekeeping or managing your Housekeeping? N  Some recent data might be hidden    Patient  Care Team: [name], MD as PCP - General (Family Medicine) Delrae Rend, MD as Consulting Physician (Endocrinology) Estill Cotta, MD as Consulting Physician (Ophthalmology)   Assessment:   This is a routine wellness  examination for [facility].  Hearing Screening Comments: Bilateral hearing aids Vision Screening Comments: Vision exam in Feb 2020 with Dr. Sandra Cockayne  Exercise Activities and Dietary recommendations Current Exercise Habits: Home exercise routine, Type of exercise: walking(1/3 mile daily), Time (Minutes): 15, Frequency (Times/Week): 7, Weekly Exercise (Minutes/Week): 105, Intensity: Mild, Exercise limited by: None identified  Goals    . Patient Stated     Starting 01/22/19, I will continue to take medications as prescribed.        Fall Risk Fall Risk  01/22/2019 11/07/2018 03/08/2018 12/14/2017 11/02/2017  Falls in the past year? 0 1 No No No  Comment - last fall February 2020 - - -  Number falls in past yr: - 1 - - -  Injury with Fall? - 1 - - -  Comment - bruising, sore shoulder - - -  Risk Factor Category  - - - - -  Comment - - - - -  Risk for fall due to : - Other (Comment) - Impaired balance/gait;Impaired mobility -  Risk for fall due to: Comment - feeling weak - - -  Follow up - - - - -  Comment - - - - -   Depression Screen PHQ 2/9 Scores 01/22/2019 03/08/2018 12/14/2017 07/18/2017  PHQ - 2 Score 0 1 1 1   PHQ- 9 Score 0 - - 8    Cognitive Function MMSE - Mini Mental State Exam 01/22/2019 07/17/2016  Orientation to time 5 5  Orientation to Place 5 5  Registration 3 3  Attention/ Calculation 0 0  Recall 2 2  Recall-comments unable to recall 1 of 3 words pt was unable to recall 1 of 3 words  Language- name 2 objects 0 0  Language- repeat 1 1  Language- follow 3 step command 0 3  Language- read & follow direction 0 0  Write a sentence 0 0  Copy design 0 0  Total score 16 19     PLEASE NOTE: A Mini-Cog screen was completed. Maximum score is 17. A value of 0 denotes this part of Folstein MMSE was not completed or the patient failed this part of the Mini-Cog screening.   Mini-Cog Screening Orientation to Time - Max 5 pts Orientation to Place - Max 5 pts Registration - Max 3  pts Recall - Max 3 pts Language Repeat - Max 1 pts      Immunization History  Administered Date(s) Administered  . Influenza Split 06/25/2012  . Influenza, High Dose Seasonal PF 06/17/2018  . Influenza,inj,Quad PF,6+ Mos 06/02/2013, 06/09/2014, 06/23/2015, 07/17/2016, 07/18/2017  . Pneumococcal Conjugate-13 11/06/2013  . Pneumococcal Polysaccharide-23 09/19/2007, 09/18/2008  . Td 07/17/2016  . Zoster 09/26/2007    Screening Tests Health Maintenance  Topic Date Due  . FOOT EXAM  09/17/2020 (Originally 02/16/2017)  . DTaP/Tdap/Td (1 - Tdap) 07/18/2026 (Originally 07/01/1950)  . INFLUENZA VACCINE  04/19/2019  . HEMOGLOBIN A1C  04/30/2019  . OPHTHALMOLOGY EXAM  10/23/2019  . TETANUS/TDAP  07/17/2026  . PNA vac Low Risk Adult  Completed     Plan:     I have personally reviewed, addressed, and noted the following in the patient's chart:  A. Medical and social history B. Use of alcohol, tobacco or illicit drugs  C. Current medications and supplements D. Functional ability  and status E.  Nutritional status F.  Physical activity G. Advance directives H. List of other physicians I.  Hospitalizations, surgeries, and ER visits in previous 12 months J.  Vitals (unless it is a telemedicine encounter) K. Screenings to include cognitive, depression, hearing, vision (NOTE: hearing and vision screenings not completed in telemedicine encounter) L. Referrals and appointments   In addition, I have reviewed and discussed with patient certain preventive protocols, quality metrics, and best practice recommendations. A written personalized care plan for preventive services and recommendations were provided to patient.  With patient's permission, we connected on 01/22/19 at 11:30 AM EDT by a video enabled telemedicine application. Two patient identifiers were used to ensure the encounter occurred with the correct person.    Patient was in home and writer was in office.   Signed,   Lindell Noe, MHA, BS, LPN Health Coach

## 2019-01-23 ENCOUNTER — Other Ambulatory Visit: Payer: Self-pay

## 2019-01-23 ENCOUNTER — Other Ambulatory Visit (INDEPENDENT_AMBULATORY_CARE_PROVIDER_SITE_OTHER): Payer: Medicare Other

## 2019-01-23 ENCOUNTER — Ambulatory Visit: Payer: Medicare Other | Admitting: Physical Therapy

## 2019-01-23 ENCOUNTER — Other Ambulatory Visit: Payer: Self-pay | Admitting: Family Medicine

## 2019-01-23 DIAGNOSIS — N183 Chronic kidney disease, stage 3 unspecified: Secondary | ICD-10-CM

## 2019-01-23 DIAGNOSIS — E1140 Type 2 diabetes mellitus with diabetic neuropathy, unspecified: Secondary | ICD-10-CM

## 2019-01-23 DIAGNOSIS — E785 Hyperlipidemia, unspecified: Secondary | ICD-10-CM

## 2019-01-23 DIAGNOSIS — N401 Enlarged prostate with lower urinary tract symptoms: Secondary | ICD-10-CM

## 2019-01-23 DIAGNOSIS — N138 Other obstructive and reflux uropathy: Secondary | ICD-10-CM

## 2019-01-23 LAB — LIPID PANEL
Cholesterol: 87 mg/dL (ref 0–200)
HDL: 17.8 mg/dL — ABNORMAL LOW (ref >39.00)
LDL Cholesterol: 33 mg/dL (ref 0–99)
NonHDL: 69.17
Total CHOL/HDL Ratio: 5
Triglycerides: 183 mg/dL — ABNORMAL HIGH (ref 0.0–149.0)
VLDL: 36.6 mg/dL (ref 0.0–40.0)

## 2019-01-23 LAB — COMPREHENSIVE METABOLIC PANEL
ALT: 19 U/L (ref 0–53)
AST: 23 U/L (ref 0–37)
Albumin: 3.9 g/dL (ref 3.5–5.2)
Alkaline Phosphatase: 39 U/L (ref 39–117)
BUN: 32 mg/dL — ABNORMAL HIGH (ref 6–23)
CO2: 26 mEq/L (ref 19–32)
Calcium: 8.9 mg/dL (ref 8.4–10.5)
Chloride: 104 mEq/L (ref 96–112)
Creatinine, Ser: 1.53 mg/dL — ABNORMAL HIGH (ref 0.40–1.50)
GFR: 43.42 mL/min — ABNORMAL LOW (ref >60.00)
Glucose, Bld: 61 mg/dL — ABNORMAL LOW (ref 70–99)
Potassium: 3.8 mEq/L (ref 3.5–5.1)
Sodium: 140 mEq/L (ref 135–145)
Total Bilirubin: 0.5 mg/dL (ref 0.2–1.2)
Total Protein: 7.1 g/dL (ref 6.0–8.3)

## 2019-01-23 LAB — VITAMIN D 25 HYDROXY (VIT D DEFICIENCY, FRACTURES): VITD: 35.64 ng/mL (ref 30.00–100.00)

## 2019-01-23 LAB — HEMOGLOBIN A1C: Hgb A1c MFr Bld: 7.1 % — ABNORMAL HIGH (ref 4.6–6.5)

## 2019-01-27 ENCOUNTER — Ambulatory Visit: Payer: Medicare Other | Admitting: Physical Therapy

## 2019-01-27 ENCOUNTER — Encounter: Payer: Self-pay | Admitting: Family Medicine

## 2019-01-27 ENCOUNTER — Ambulatory Visit (INDEPENDENT_AMBULATORY_CARE_PROVIDER_SITE_OTHER): Payer: Medicare Other | Admitting: Family Medicine

## 2019-01-27 VITALS — BP 116/68 | HR 68 | Temp 98.6°F | Ht 68.0 in | Wt 189.0 lb

## 2019-01-27 DIAGNOSIS — F331 Major depressive disorder, recurrent, moderate: Secondary | ICD-10-CM

## 2019-01-27 DIAGNOSIS — E785 Hyperlipidemia, unspecified: Secondary | ICD-10-CM

## 2019-01-27 DIAGNOSIS — I120 Hypertensive chronic kidney disease with stage 5 chronic kidney disease or end stage renal disease: Secondary | ICD-10-CM

## 2019-01-27 DIAGNOSIS — N183 Chronic kidney disease, stage 3 unspecified: Secondary | ICD-10-CM

## 2019-01-27 DIAGNOSIS — I10 Essential (primary) hypertension: Secondary | ICD-10-CM

## 2019-01-27 DIAGNOSIS — K219 Gastro-esophageal reflux disease without esophagitis: Secondary | ICD-10-CM

## 2019-01-27 DIAGNOSIS — G8110 Spastic hemiplegia affecting unspecified side: Secondary | ICD-10-CM | POA: Diagnosis not present

## 2019-01-27 DIAGNOSIS — Z794 Long term (current) use of insulin: Secondary | ICD-10-CM

## 2019-01-27 DIAGNOSIS — I69351 Hemiplegia and hemiparesis following cerebral infarction affecting right dominant side: Secondary | ICD-10-CM | POA: Diagnosis not present

## 2019-01-27 DIAGNOSIS — I5032 Chronic diastolic (congestive) heart failure: Secondary | ICD-10-CM | POA: Diagnosis not present

## 2019-01-27 DIAGNOSIS — Z7409 Other reduced mobility: Secondary | ICD-10-CM

## 2019-01-27 DIAGNOSIS — Z7189 Other specified counseling: Secondary | ICD-10-CM

## 2019-01-27 DIAGNOSIS — Z8673 Personal history of transient ischemic attack (TIA), and cerebral infarction without residual deficits: Secondary | ICD-10-CM

## 2019-01-27 DIAGNOSIS — E1140 Type 2 diabetes mellitus with diabetic neuropathy, unspecified: Secondary | ICD-10-CM | POA: Diagnosis not present

## 2019-01-27 DIAGNOSIS — R269 Unspecified abnormalities of gait and mobility: Secondary | ICD-10-CM

## 2019-01-27 DIAGNOSIS — I69398 Other sequelae of cerebral infarction: Secondary | ICD-10-CM

## 2019-01-27 DIAGNOSIS — E1121 Type 2 diabetes mellitus with diabetic nephropathy: Secondary | ICD-10-CM

## 2019-01-27 DIAGNOSIS — I110 Hypertensive heart disease with heart failure: Secondary | ICD-10-CM

## 2019-01-27 DIAGNOSIS — E1122 Type 2 diabetes mellitus with diabetic chronic kidney disease: Secondary | ICD-10-CM | POA: Diagnosis not present

## 2019-01-27 MED ORDER — DEXLANSOPRAZOLE 60 MG PO CPDR: 60.0000 mg | DELAYED_RELEASE_CAPSULE | Freq: Every day | ORAL | 3 refills | Status: DC

## 2019-01-27 MED ORDER — VALSARTAN 40 MG PO TABS: 40.0000 mg | ORAL_TABLET | Freq: Every day | ORAL | 3 refills | Status: DC

## 2019-01-27 NOTE — Assessment & Plan Note (Signed)
We changed omeprazole due to plavix interaction. Pantoprazole not as effective - will trial dexilant.

## 2019-01-27 NOTE — Assessment & Plan Note (Signed)
Deteriorated. Encouraged good hydration status - he could do better.

## 2019-01-27 NOTE — Assessment & Plan Note (Addendum)
Chronic, stable. Attributes increasing readings to more sedentary lifestyle with dietary indiscretion. Encouraged ongoing efforts at following diabetic diet.

## 2019-01-27 NOTE — Progress Notes (Signed)
Virtual visit completed through [employer]. Due to national recommendations of social distancing due to [place], a virtual visit is felt to be most appropriate for this patient at this time. Interactive audio and video telecommunications were attempted between myself and James Bell, James Bell, however failed due to having technical difficulties. We continued and completed visit with audio only.  Time: 9:38am - 9:59am   Patient location: home Provider location: [place] at [facility], office If any vitals were documented, they were collected by patient at home unless specified below.    BP 116/68 (BP Location: Left Arm, Patient Position: Sitting)   Pulse 68   Temp 98.6 F (37 C) (Oral)   Ht 5\' 8"  (1.727 m)   Wt 189 lb (85.7 kg)   BMI 28.74 kg/m    CC: AMW f/u visit Subjective:    Patient ID: James Bell, James Bell, male    DOB: 1931/05/13, 85 y.o.   MRN: [id number]  HPI: James Bell is a 85 y.o. male presenting on 01/27/2019 for Annual Exam (Pt 2. Wants to discuss pantoprazole.)   Saw Lesia last week for medicare wellness visit. Note reviewed.    H/o L subcortical infarct with resultant chronic R hemiparesis.   Preventative: COLONOSCOPY Date: 06/2011 mult polyps adenomatous, severe diverticulosis, rpt 3 yrs [name]). Pt decides to defer for now.  Prostate - checked by urology  Flu shot today  Pneumovax - 09/2007. prevnar 2015  Td - 06/2016 zostavax - 09/2007  shingrix - discussed Advanced directives: Has advanced directives at home. Will bring copy for chart. Daughter [name] is HCPOA. Seat belt use discussed  Sunscreen use discussed. No changing moles on skin. Sees derm.  Non smoker Alcohol - none Dentist - q6 mo  Eye exam - q6 mo  Trouble with IADLs 2/2 stroke.   Caffeine: 1 cup coffee/day  Occupation: ophthalmologist, retired Edu: James Bell  Activity: going to gym 3x/wk with trainer  Diet: some water, fruits/vegetables some, red meat a few times, fish  1x/wk POA - [name] (Daughter)      Relevant past medical, surgical, family and social history reviewed and updated as indicated. Interim medical history since our last visit reviewed. Allergies and medications reviewed and updated. Outpatient Medications Prior to Visit  Medication Sig Dispense Refill  . capsaicin (ZOSTRIX) 0.025 % cream Apply topically 2 (two) times daily. 60 g 0  . clopidogrel (PLAVIX) 75 MG tablet TAKE 1 TABLET DAILY 90 tablet 3  . Coenzyme Q10 (COQ10) 150 MG CAPS Take 1 capsule by mouth 2 (two) times daily.    . CRESTOR 40 MG tablet TAKE 1 TABLET DAILY 90 tablet 1  . diazepam (VALIUM) 5 MG tablet Take 1 tablet (5 mg total) by mouth every 8 (eight) hours as needed for muscle spasms. 90 tablet 0  . diclofenac sodium (VOLTAREN) 1 % GEL APPLY 2 GRAMS TOPICALLY FOUR TIMES A DAY 300 g 1  . DULoxetine (CYMBALTA) 60 MG capsule TAKE 1 CAPSULE DAILY 90 capsule 3  . FOLBEE 2.5-25-1 MG TABS tablet TAKE 1 TABLET DAILY 90 tablet 1  . gabapentin (NEURONTIN) 300 MG capsule TAKE 1 CAPSULE IN THE MORNING AND 2 CAPSULES AT NIGHT 270 capsule 3  . ibuprofen (ADVIL,MOTRIN) 200 MG tablet Take 400 mg by mouth every 6 (six) hours as needed for mild pain or moderate pain.    . Insulin Degludec (TRESIBA FLEXTOUCH) 200 UNIT/ML SOPN Inject 60 Units into the skin at bedtime.     Marland Kitchen  Insulin Pen Needle (NOVOFINE) 30G X 8 MM MISC Use to inject insulin once daily dx:E11.40 300 each 3  . levothyroxine (LEVOTHROID) 25 MCG tablet Take 1 tablet (25 mcg total) by mouth daily before breakfast. Per endo [name]    . metoprolol tartrate (LOPRESSOR) 25 MG tablet Take 0.5 tablets (12.5 mg total) by mouth at bedtime. With AM dose if needed 90 tablet 1  . metroNIDAZOLE (METROCREAM) 0.75 % cream Apply 1 application topically 3 (three) times daily.    . nitroGLYCERIN (NITROSTAT) 0.4 MG SL tablet DISSOLVE 1 TABLET UNDER THE TONGUE EVERY 5 MINUTES AS NEEDED FOR CHEST PAIN 25 tablet 1  . omega-3 acid ethyl esters  (LOVAZA) 1 g capsule Take 2 capsules (2 g total) by mouth 2 (two) times daily. 360 capsule 1  . pantoprazole (PROTONIX) 40 MG tablet Take 40 mg by mouth daily.    . pramipexole (MIRAPEX) 1 MG tablet TAKE 2 TABLETS AT 3 P.M. AND 2 TABLETS AT BEDTIME AS NEEDED 360 tablet 0  . Semaglutide (OZEMPIC) 0.25 or 0.5 MG/DOSE SOPN Inject into the skin.    Marland Kitchen traZODone (DESYREL) 50 MG tablet Take 50 mg by mouth at bedtime as needed for sleep.    . vitamin C (ASCORBIC ACID) 500 MG tablet Take 1,000 mg by mouth daily.     . valsartan (DIOVAN) 40 MG tablet Take 1 tablet (40 mg total) by mouth daily. 90 tablet 1  . omeprazole (PRILOSEC) 40 MG capsule Take 40 mg by mouth daily.    . pantoprazole (PROTONIX) 40 MG tablet Take 1 tablet (40 mg total) by mouth daily. 90 tablet 3   No facility-administered medications prior to visit.      Per HPI unless specifically indicated in ROS section below Review of Systems Objective:    BP 116/68 (BP Location: Left Arm, Patient Position: Sitting)   Pulse 68   Temp 98.6 F (37 C) (Oral)   Ht 5\' 8"  (1.727 m)   Wt 189 lb (85.7 kg)   BMI 28.74 kg/m   Wt Readings from Last 3 Encounters:  01/27/19 189 lb (85.7 kg)  12/23/18 190 lb (86.2 kg)  11/29/18 189 lb 2 oz (85.8 kg)     Physical exam: Gen: alert, NAD, not ill appearing Pulm: speaks in complete sentences without increased work of breathing Psych: normal mood, normal thought content      Results for orders placed or performed in visit on 01/23/19  VITAMIN D 25 Hydroxy (Vit-D Deficiency, Fractures)  Result Value Ref Range   VITD 35.64 30.00 - 100.00 ng/mL  Hemoglobin A1c  Result Value Ref Range   Hgb A1c MFr Bld 7.1 (H) 4.6 - 6.5 %  Comprehensive metabolic panel  Result Value Ref Range   Sodium 140 135 - 145 mEq/L   Potassium 3.8 3.5 - 5.1 mEq/L   Chloride 104 96 - 112 mEq/L   CO2 26 19 - 32 mEq/L   Glucose, Bld 61 (L) 70 - 99 mg/dL   BUN 32 (H) 6 - 23 mg/dL   Creatinine, Ser 1.53 (H) 0.40 - 1.50  mg/dL   Total Bilirubin 0.5 0.2 - 1.2 mg/dL   Alkaline Phosphatase 39 39 - 117 U/L   AST 23 0 - 37 U/L   ALT 19 0 - 53 U/L   Total Protein 7.1 6.0 - 8.3 g/dL   Albumin 3.9 3.5 - 5.2 g/dL   Calcium 8.9 8.4 - 10.5 mg/dL   GFR 43.42 (L) >60.00 mL/min  Lipid panel  Result Value Ref Range   Cholesterol 87 0 - 200 mg/dL   Triglycerides 183.0 (H) 0.0 - 149.0 mg/dL   HDL 17.80 (L) >39.00 mg/dL   VLDL 36.6 0.0 - 40.0 mg/dL   LDL Cholesterol 33 0 - 99 mg/dL   Total CHOL/HDL Ratio 5    NonHDL 69.17    Assessment & Plan:   Problem List Items Addressed This Visit    Type 2 diabetes, controlled, with neuropathy (HCC)    Chronic, stable. Attributes increasing readings to more sedentary lifestyle with dietary indiscretion. Encouraged ongoing efforts at following diabetic diet.       Relevant Medications   valsartan (DIOVAN) 40 MG tablet   Type 2 diabetes mellitus with diabetic nephropathy (HCC)   Relevant Medications   valsartan (DIOVAN) 40 MG tablet   Spastic hemiparesis affecting dominant side (HCC)   MDD (major depressive disorder), recurrent episode, moderate (HCC)    Stable period on cymbalta and trazodone and levothyroxine previously followed by psych      Impaired mobility   HTN (hypertension)    Chronic, stable. Continue current regimen.       Relevant Medications   valsartan (DIOVAN) 40 MG tablet   HLD (hyperlipidemia)    Chronic, stable. Continue crestor. HDL low, trig elevated. Discussed this.  The ASCVD Risk score Mikey Bussing DC Jr., et al., 2013) failed to calculate for the following reasons:   The 2013 ASCVD risk score is only valid for ages 69 to 8   The patient has a prior MI or stroke diagnosis       Relevant Medications   valsartan (DIOVAN) 40 MG tablet   History of CVA (cerebrovascular accident)   Hemiparesis affecting right side as late effect of stroke (HCC)   GERD (gastroesophageal reflux disease)    We changed omeprazole due to plavix interaction.  Pantoprazole not as effective - will trial dexilant.       Relevant Medications   pantoprazole (PROTONIX) 40 MG tablet   dexlansoprazole (DEXILANT) 60 MG capsule   Gait disturbance, post-stroke   CKD stage 3 secondary to diabetes ([place])    Deteriorated. Encouraged good hydration status - he could do better.       Relevant Medications   valsartan (DIOVAN) 40 MG tablet   Chronic diastolic heart failure, NYHA class 1 (HCC)   Relevant Medications   valsartan (DIOVAN) 40 MG tablet   Advanced care planning/counseling discussion - Primary    Advanced directives: Has advanced directives at home. Will bring copy for chart. Daughter [name] is HCPOA.          Meds ordered this encounter  Medications  . valsartan (DIOVAN) 40 MG tablet    Sig: Take 1 tablet (40 mg total) by mouth daily.    Dispense:  90 tablet    Refill:  3  . dexlansoprazole (DEXILANT) 60 MG capsule    Sig: Take 1 capsule (60 mg total) by mouth daily.    Dispense:  30 capsule    Refill:  3    In place of pantoprazole   No orders of the defined types were placed in this encounter.   Follow up plan: Return in about 6 months (around 07/30/2019) for follow up visit.  He will call to schedule this.   [name], James Bell

## 2019-01-27 NOTE — Assessment & Plan Note (Signed)
Chronic, stable. Continue current regimen. 

## 2019-01-27 NOTE — Assessment & Plan Note (Signed)
Stable period on cymbalta and trazodone and levothyroxine previously followed by psych

## 2019-01-27 NOTE — Assessment & Plan Note (Signed)
Chronic, stable. Continue crestor. HDL low, trig elevated. Discussed this.  The ASCVD Risk score Mikey Bussing DC Jr., et al., 2013) failed to calculate for the following reasons:   The 2013 ASCVD risk score is only valid for ages 41 to 33   The patient has a prior MI or stroke diagnosis

## 2019-01-27 NOTE — Patient Instructions (Addendum)
Bring me copy of your advanced directive.  If interested, check with pharmacy about new 2 shot shingles series (shingrix).  Try dexilant in place of pantoprazole for GERD. This shouldn't interact with plavix.  You are doing well today. Return as needed or in 6 months for follow up visit, call us to schedule appointment.

## 2019-01-27 NOTE — Assessment & Plan Note (Signed)
Advanced directives: Has advanced directives at home. Will bring copy for chart. Daughter James Bell is HCPOA.  

## 2019-01-30 ENCOUNTER — Ambulatory Visit: Payer: Medicare Other | Admitting: Physical Therapy

## 2019-02-03 ENCOUNTER — Ambulatory Visit: Payer: Medicare Other | Admitting: Physical Therapy

## 2019-02-03 ENCOUNTER — Telehealth: Payer: Self-pay | Admitting: Family Medicine

## 2019-02-03 DIAGNOSIS — K219 Gastro-esophageal reflux disease without esophagitis: Secondary | ICD-10-CM

## 2019-02-03 MED ORDER — RABEPRAZOLE SODIUM 20 MG PO TBEC: 20.0000 mg | DELAYED_RELEASE_TABLET | Freq: Every day | ORAL | 1 refills | Status: DC

## 2019-02-03 NOTE — Telephone Encounter (Signed)
Received notice from pharmacy that dexilant would not be covered - instead aciphex or nexium are preferred.

## 2019-02-06 ENCOUNTER — Ambulatory Visit: Payer: Medicare Other | Admitting: Physical Therapy

## 2019-02-11 ENCOUNTER — Ambulatory Visit: Payer: Medicare Other | Admitting: Physical Therapy

## 2019-02-13 ENCOUNTER — Ambulatory Visit: Payer: Medicare Other | Admitting: Physical Therapy

## 2019-02-17 ENCOUNTER — Encounter: Payer: Self-pay | Admitting: Physical Therapy

## 2019-02-17 ENCOUNTER — Ambulatory Visit: Payer: Medicare Other | Attending: Physical Medicine & Rehabilitation | Admitting: Physical Therapy

## 2019-02-17 ENCOUNTER — Other Ambulatory Visit: Payer: Self-pay

## 2019-02-17 DIAGNOSIS — R293 Abnormal posture: Secondary | ICD-10-CM | POA: Diagnosis not present

## 2019-02-17 DIAGNOSIS — M6281 Muscle weakness (generalized): Secondary | ICD-10-CM | POA: Diagnosis not present

## 2019-02-17 DIAGNOSIS — R2689 Other abnormalities of gait and mobility: Secondary | ICD-10-CM | POA: Insufficient documentation

## 2019-02-17 DIAGNOSIS — R2681 Unsteadiness on feet: Secondary | ICD-10-CM | POA: Diagnosis not present

## 2019-02-17 DIAGNOSIS — R269 Unspecified abnormalities of gait and mobility: Secondary | ICD-10-CM | POA: Insufficient documentation

## 2019-02-17 DIAGNOSIS — R261 Paralytic gait: Secondary | ICD-10-CM | POA: Insufficient documentation

## 2019-02-17 DIAGNOSIS — R296 Repeated falls: Secondary | ICD-10-CM | POA: Insufficient documentation

## 2019-02-17 NOTE — Therapy (Signed)
[facility] [address] [place], [name], [address] Phone: [phone]   Fax:  [phone]  Physical Therapy Treatment & Reassessment  Patient Details  Name: James DURNIN, MD MRN: [id number] Date of Birth: 1931/04/19 Referring Provider (PT): Charlett Blake MD   Encounter Date: 02/17/2019  PT End of Session - 02/17/19 2015    Visit Number  4    Number of Visits  29    Date for PT Re-Evaluation  05/16/19    Authorization Type  Medicare & Tricare    Authorization Time Period  T[employer]are will pick up 100% after Medicare    PT Start Time  1307    PT Stop Time  1348    PT Time Calculation (min)  41 min    Equipment Utilized During Treatment  Gait belt    Activity Tolerance  Patient tolerated treatment well    Behavior During Therapy  [facility] for tasks assessed/performed       CLINIC OPERATION CHANGES: [facility] is operating at a low capacity due to COVID-19.  The patient was brought into the clinic for evaluation and/or treatment following universal masking by staff, social distancing, and <10 people in the clinic.  The patient's COVID risk of complications score is 7.    Past Medical History:  Diagnosis Date  . Anemia   . Basal cell carcinoma 2018   R temple   . Basal cell carcinoma of nose 2002   bridge of nose  . BPH with obstruction/lower urinary tract symptoms    failed flomax, uroxatral, myrbetriq - sees urology [name]) pending videourodynamics  . CKD (chronic kidney disease) stage 3, GFR 30-59 ml/min (HCC) 03/11/2012   Renal US 02/2014 - multiple kidney cysts   . CVA (cerebral infarction) 03/07/12   slurred speech; right hemplegia, left handed - completed PT 07/2014 (17 sessions)  . Depression    mild, on cymbalta per prior PCP  . Erectile dysfunction    per urology (prostaglandin injection)  . GERD (gastroesophageal reflux disease)    with sliding HH  . H/O hiatal  hernia   . History of kidney problems 12/2010   lacerated left kidney after fall  . HLD (hyperlipidemia)   . Hx of basal cell carcinoma 07/10/2017   R. lat. forehead near hair line  . Kidney cysts 02/2014   bilateral (renal US)  . Migraines 03/11/12   now rare  . OSA on CPAP    sleep study 01/2014 - severe, CPAP at 10, previously on nuvigil (Dr. [name])  . Palpitations 01/2012   holter WNL, rare PAC/PVCs  . Presbycusis of both ears 2016   rec amplification [facility])  . Restless leg syndrome    well controlled on mirapex  . Rosacea    [place]  . TIA (transient ischemic attack)   . Type 2 diabetes, controlled, with neuropathy [facility]) 2011   [name] in [facility]    Past Surgical History:  Procedure Laterality Date  . ABI  2007   WNL  . CARDIAC CATHETERIZATION  2004   normal; [place]  . CARDIOVASCULAR STRESS TEST  02/2014   WNL, low risk scan, EF 68%  . CAROTID ENDARTERECTOMY    . CATARACT EXTRACTION W/ INTRAOCULAR LENS IMPLANT  11//2012 - 08/2011  . COLONOSCOPY  03/2011   poor prep, unable to biopsy polyp in tic fold, rpt 3 mo [name])  . COLONOSCOPY  06/2011   mult polyps adenomatous, severe diverticulosis, rpt  3 yrs [name])  . CYSTOSCOPY WITH URETHRAL DILATATION N/A 07/24/2013   Procedure: CYSTOSCOPY WITH URETHRAL DILATATION;  Surgeon: [name], MD;  Location: [facility];  Service: Urology;  Laterality: N/A;  . ESOPHAGOGASTRODUODENOSCOPY  2006   duodenitis, medual HH, Grade 2 reflux, mild gastritis  . ESOPHAGOGASTRODUODENOSCOPY  07/2011   mild chronic gastritis  . [place]  . [place]; 2002   left, then repaired  . MRI brain  04/2010   WNL  . PROSTATE SURGERY  02/2009   PVP (laser)  . SKIN CANCER EXCISION  2002   bridge of nose, BCC  . TONSILLECTOMY AND ADENOIDECTOMY  ~ 1945  . TRANSURETHRAL INCISION OF PROSTATE N/A 07/24/2013   Procedure: TRANSURETHRAL INCISION OF THE PROSTATE (TUIP);  Surgeon: [name], MD;   Location: [facility];  Service: Urology;  Laterality: N/A;  . URETHRAL DILATION  2014   [place]    There were no vitals filed for this visit.  Subjective Assessment - 02/17/19 1310    Subjective  He has been doing the [employer] & walking 1/3 mile daily. It takes ~25 minutes. He says current AFO is still not working correctly. No falls.     Currently in Pain?  No/denies         [facility] PT Assessment - 02/17/19 1305      Assessment   Medical Diagnosis  right hemiparesis, gait abnormality    Referring Provider (PT)  Charlett Blake MD      Ambulation/Gait   Ambulation/Gait  Yes      Standardized Balance Assessment   Standardized Balance Assessment  Timed Up and Go Test;Dynamic Gait Index      Dynamic Gait Index   Level Surface  Moderate Impairment    Change in Gait Speed  Moderate Impairment    Gait with Horizontal Head Turns  Moderate Impairment    Gait with Vertical Head Turns  Moderate Impairment    Gait and Pivot Turn  Moderate Impairment    Step Over Obstacle  Moderate Impairment    Step Around Obstacles  Moderate Impairment    Steps  Moderate Impairment    Total Score  8      Timed Up and Go Test   Normal TUG (seconds)  19.85   19.85sec SBQC & 17.75sec single point cane quad tip   Cognitive TUG (seconds)  24.81   24.81sec w/cane quad tip, naming states along [place]   TUG Comments  Cognitive TUG stopped naming in turns, increase 39.8% & 25.0% from std TUGs      Functional Gait  Assessment   Gait assessed   Yes    Gait Level Surface  Walks 20 ft, slow speed, abnormal gait pattern, evidence for imbalance or deviates 10-15 in outside of the 12 in walkway width. Requires more than 7 sec to ambulate 20 ft.    Change in Gait Speed  Makes only minor adjustments to walking speed, or accomplishes a change in speed with significant gait deviations, deviates 10-15 in outside the 12 in walkway width, or changes speed but loses balance but is able to recover and continue  walking.    Gait with Horizontal Head Turns  Performs head turns with moderate changes in gait velocity, slows down, deviates 10-15 in outside 12 in walkway width but recovers, can continue to walk.    Gait with Vertical Head Turns  Performs task with moderate change in gait velocity, slows down, deviates  10-15 in outside 12 in walkway width but recovers, can continue to walk.    Gait and Pivot Turn  Turns slowly, requires verbal cueing, or requires several small steps to catch balance following turn and stop    Step Over Obstacle  Is able to step over one shoe box (4.5 in total height) but must slow down and adjust steps to clear box safely. May require verbal cueing.    Gait with Narrow Base of Support  Ambulates less than 4 steps heel to toe or cannot perform without assistance.    Gait with Eyes Closed  Cannot walk 20 ft without assistance, severe gait deviations or imbalance, deviates greater than 15 in outside 12 in walkway width or will not attempt task.    Ambulating Backwards  Walks 20 ft, slow speed, abnormal gait pattern, evidence for imbalance, deviates 10-15 in outside 12 in walkway width.    Steps  Alternating feet, must use rail.    Total Score  9                   [facility] Adult PT Treatment/Exercise - 02/17/19 1305      Transfers   Transfers  Sit to Stand;Stand to Sit    Sit to Stand  6: Modified independent (Device/Increase time);With upper extremity assist;With armrests;From chair/3-in-1    Stand to Sit  6: Modified independent (Device/Increase time);With upper extremity assist;With armrests;To chair/3-in-1      Ambulation/Gait   Ambulation/Gait Assistance  5: Supervision;4: Min assist   minA with single point cane quad tip   Ambulation/Gait Assistance Details  right ankle pronates into AFO strut during stance    Ambulation Distance (Feet)  300 Feet    Assistive device  Small based quad cane;Straight cane;Other (Comment)   single point cane quad tip, AFO PLS  dynamic w/medial strut   Gait Pattern  Step-through pattern;Decreased arm swing - right;Decreased step length - left;Decreased stance time - right;Decreased stride length;Decreased hip/knee flexion - right;Decreased dorsiflexion - right;Decreased weight shift to right;Right hip hike;Right genu recurvatum;Antalgic;Scissoring;Lateral hip instability;Narrow base of support    Ambulation Surface  Indoor;Level    Gait velocity  1.74 ft/sec SBQC comfortable pace,  2.24 ft/sec SBQC fast pace;  1.60 ft/sec single point cane quad tip comfortable pace.      Stairs  Yes    Stairs Assistance  5: Supervision    Stairs Assistance Details (indicate cue type and reason)  carried cane in right arm with LUE on rail.     Stair Management Technique  One rail Left;Alternating pattern;Forwards;Other (comment)   slowed movements   Number of Stairs  4    Ramp  5: Supervision   SBQC & AFO   Curb  5: Supervision   SBQC & AFO     Standardized Balance Assessment   Standardized Balance Assessment  Timed Up and Go Test      Timed Up and Go Test   TUG  Normal TUG;Cognitive TUG    Normal TUG (seconds)  19.85   19.85sec SBQC & 17.75sec single point cane quad tip   Cognitive TUG (seconds)  24.81   single point cane quad tip, naming states along [address].            PT Education - 02/17/19 1345    Education Details  need for new AFO    Person(s) Educated  Patient    Methods  Explanation    Comprehension  Verbalized understanding;Need further instruction  PT Short Term Goals - 02/17/19 2018      PT SHORT TERM GOAL #1   Title  Patient demostrates understanding of updated HEP. (All STGs Target Date: 03/20/2019)    Time  1    Period  Months    Status  On-going    Target Date  03/20/19      PT SHORT TERM GOAL #2   Title  Patient ambulates 300' with cane with quad tip & AFO with supervision.     Time  1    Period  Months    Status  Revised    Target Date  03/20/19      PT SHORT TERM GOAL #3   Title   Patient negotiates ramps & curbs with cane with quad tip with supervision.     Time  1    Period  Months    Status  Revised    Target Date  03/20/19      PT SHORT TERM GOAL #4   Title  Timed Up & Go with cane with quad tip <17.5 seconds.    Time  1    Period  Months    Status  Revised    Target Date  03/20/19      PT SHORT TERM GOAL #5   Title  Patient verbalizes understanding of new AFO recommendations.     Time  1    Period  Months    Status  New    Target Date  03/20/19        PT Long Term Goals - 02/17/19 2023      PT LONG TERM GOAL #1   Title  Patient verbalizes & demonstrates understanding of ongoing HEP / fitness plan. (All LTGs Target Date: 05/16/2019)    Time  3    Period  Months    Status  On-going    Target Date  05/16/19      PT LONG TERM GOAL #2   Title  Gait Velocity with cane with quad tip comfortable pace >/= 2.1 ft/sec and fast >/= 2.5 ft/sec    Time  3    Period  Months    Status  Revised    Target Date  05/16/19      PT LONG TERM GOAL #3   Title  Timed Up & Go with cane with quad tip <13.5 seconds to indicate lower fall risk.     Time  3    Period  Months    Status  Revised    Target Date  05/16/19      PT LONG TERM GOAL #4   Title  Dynamic Gait Index with Cane with quad tip >12/24 to indicate lower fall risk    Time  3    Period  Months    Status  Revised    Target Date  05/16/19      PT LONG TERM GOAL #5   Title  Patient negotiates ramps & curbs with cane with quad tip modified independent.     Time  3    Period  Months    Status  Revised    Target Date  05/16/19      Additional Long Term Goals   Additional Long Term Goals  Yes      PT LONG TERM GOAL #6   Title  Cognitive Timed Up & Go with cane with quad tip <20sec & continues naming activity to indicate lower fall risk when distracted  Time  3    Period  Months    Status  New    Target Date  05/16/19            Plan - 02/17/19 2029    Clinical Impression Statement   Due to COVID crisis patient was placed on hold after 3 visits of orginal plan of care. Patient appears to need new AFO and PT plans an orthotic consult. Patient ambulates with [facility] with supervision at 1.74 ft/sec comfortable pace and 2.24 ft/sec fast pace. PT assessed gait with straight cane with quad tip which appeared more fluent but will need more training for safety. Dynamic Gait Index with [place] 8/24 indicating fall risk. Timed Up & Go with cane 19.85sec & 17.75sec indicates fall risk and cognitive TUG 24.81sec (25.0% & 39.8% increase) & stops naming with turns.  Patient would benefit further PT to improve mobility and safety.     Personal Factors and Comorbidities  Age;Comorbidity 3+;Time since onset of injury/illness/exacerbation;Other   lives alone   Comorbidities  CVA, DM, neuropathy, HTN, restless leg syndrome    Examination-Activity Limitations  Lift;Locomotion Level;Stairs;Stand;Other   balance   Examination-Participation Restrictions  Community Activity    Stability/Clinical Decision Making  Evolving/Moderate complexity    Rehab Potential  Good    PT Frequency  2x / week    PT Duration  12 weeks    PT Treatment/Interventions  ADLs/Self Care Home Management;DME Instruction;Gait training;Stair training;Functional mobility training;Therapeutic activities;Therapeutic exercise;Balance training;Neuromuscular re-education;Patient/family education;Orthotic Fit/Training;Vestibular    PT Next Visit Plan  simulate doorway threshold runner navigation, gait training with straight cane with quad tip, orthotic consult    Recommended Other Services  new AFO with lateral strut    Consulted and Agree with Plan of Care  Patient       Patient will benefit from skilled therapeutic intervention in order to improve the following deficits and impairments:  Abnormal gait, Decreased activity tolerance, Decreased balance, Decreased coordination, Decreased endurance, Decreased mobility, Decreased range of motion,  Decreased strength, Impaired tone, Impaired flexibility, Postural dysfunction, Other (comment)(orthotic dependency)  Visit Diagnosis: Unsteadiness on feet  Muscle weakness (generalized)  Paralytic gait  Other abnormalities of gait and mobility  Abnormal posture  Repeated falls  Unsteadiness  Muscle weakness     Problem List Patient Active Problem List   Diagnosis Date Noted  . Type 2 diabetes mellitus with diabetic nephropathy ([place]) 11/29/2018  . Headache 10/30/2018  . Peripheral neuropathy 01/15/2018  . Left wrist pain 01/15/2018  . Hx of basal cell carcinoma 07/10/2017  . Rosacea   . Gait disturbance, post-stroke 10/05/2015  . Presbycusis of both ears   . Advanced care planning/counseling discussion 06/23/2015  . Impaired mobility 04/23/2015  . HTN (hypertension) 03/18/2015  . Chronic diastolic heart failure, NYHA class 1 ([place]) 03/18/2015  . Spastic hemiparesis affecting dominant side ([place]) 07/07/2014  . Chest pressure 02/03/2014  . Medicare annual wellness visit, subsequent 11/06/2013  . Erectile dysfunction due to arterial insufficiency 02/05/2013  . MDD (major depressive disorder), recurrent episode, moderate ([place])   . Palpitations 06/26/2012  . HLD (hyperlipidemia)   . Restless leg syndrome   . GERD (gastroesophageal reflux disease)   . BPH with obstruction/lower urinary tract symptoms   . History of CVA (cerebrovascular accident) 03/11/2012  . Hemiparesis affecting right side as late effect of stroke ([place]) 03/11/2012  . Type 2 diabetes, controlled, with neuropathy ([place]) 03/11/2012  . OSA on CPAP 03/11/2012  . CKD stage 3 secondary to diabetes ([place])  03/11/2012  . Migraines 03/11/2012    Sagrario Lineberry PT, DPT 02/17/2019, 8:41 PM  [facility] [address] [place], [name], [address] Phone: [phone]   Fax:  [phone]  Name: GEORDIE NOONEY, MD MRN: [id number] Date of Birth:  [birth year]

## 2019-02-20 ENCOUNTER — Ambulatory Visit: Payer: Medicare Other | Admitting: Physical Therapy

## 2019-02-21 ENCOUNTER — Encounter: Payer: Self-pay | Admitting: Physical Therapy

## 2019-02-21 ENCOUNTER — Ambulatory Visit: Payer: Medicare Other | Admitting: Physical Therapy

## 2019-02-21 ENCOUNTER — Other Ambulatory Visit: Payer: Self-pay

## 2019-02-21 DIAGNOSIS — R2689 Other abnormalities of gait and mobility: Secondary | ICD-10-CM

## 2019-02-21 DIAGNOSIS — R296 Repeated falls: Secondary | ICD-10-CM

## 2019-02-21 DIAGNOSIS — R293 Abnormal posture: Secondary | ICD-10-CM

## 2019-02-21 DIAGNOSIS — R2681 Unsteadiness on feet: Secondary | ICD-10-CM

## 2019-02-21 DIAGNOSIS — M6281 Muscle weakness (generalized): Secondary | ICD-10-CM

## 2019-02-21 DIAGNOSIS — R269 Unspecified abnormalities of gait and mobility: Secondary | ICD-10-CM

## 2019-02-21 DIAGNOSIS — R261 Paralytic gait: Secondary | ICD-10-CM | POA: Diagnosis not present

## 2019-02-21 NOTE — Therapy (Signed)
[facility] [address] [place], [name], [address] Phone: [phone]   Fax:  [phone]  Physical Therapy Treatment  Patient Details  Name: James MARKGRAF, MD MRN: [id number] Date of Birth: Oct 08, 1930 Referring Provider (PT): Charlett Blake MD   Encounter Date: 02/21/2019   CLINIC OPERATION CHANGES: Outpatient [facility] Re[facility] is open at lower capacity following universal masking, social distancing, and patient screening.  The patient's COVID risk of complications score is 7.   PT End of Session - 02/21/19 1400    Visit Number  5    Number of Visits  29    Date for PT Re-Evaluation  05/16/19    Authorization Type  Medicare & Tricare    Authorization Time Period  [employer] will pick up 100% after Medicare    PT Start Time  1300    PT Stop Time  1345    PT Time Calculation (min)  45 min    Activity Tolerance  Patient tolerated treatment well    Behavior During Therapy  WFL for tasks assessed/performed       Past Medical History:  Diagnosis Date  . Anemia   . Basal cell carcinoma 2018   R temple   . Basal cell carcinoma of nose 2002   bridge of nose  . BPH with obstruction/lower urinary tract symptoms    failed flomax, uroxatral, myrbetriq - sees urology [name]) pending videourodynamics  . CKD (chronic kidney disease) stage 3, GFR 30-59 ml/min (HCC) 03/11/2012   Renal US 02/2014 - multiple kidney cysts   . CVA (cerebral infarction) 03/07/12   slurred speech; right hemplegia, left handed - completed PT 07/2014 (17 sessions)  . Depression    mild, on cymbalta per prior PCP  . Erectile dysfunction    per urology (prostaglandin injection)  . GERD (gastroesophageal reflux disease)    with sliding HH  . H/O hiatal hernia   . History of kidney problems 12/2010   lacerated left kidney after fall  . HLD (hyperlipidemia)   . Hx of basal cell carcinoma 07/10/2017   R. lat. forehead near hair line  . Kidney  cysts 02/2014   bilateral (renal US)  . Migraines 03/11/12   now rare  . OSA on CPAP    sleep study 01/2014 - severe, CPAP at 10, previously on nuvigil (Dr. [name])  . Palpitations 01/2012   holter WNL, rare PAC/PVCs  . Presbycusis of both ears 2016   rec amplification [facility])  . Restless leg syndrome    well controlled on mirapex  . Rosacea    [place]  . TIA (transient ischemic attack)   . Type 2 diabetes, controlled, with neuropathy [facility]) 2011   [name] in [facility]    Past Surgical History:  Procedure Laterality Date  . ABI  2007   WNL  . CARDIAC CATHETERIZATION  2004   normal; [place]  . CARDIOVASCULAR STRESS TEST  02/2014   WNL, low risk scan, EF 68%  . CAROTID ENDARTERECTOMY    . CATARACT EXTRACTION W/ INTRAOCULAR LENS IMPLANT  11//2012 - 08/2011  . COLONOSCOPY  03/2011   poor prep, unable to biopsy polyp in tic fold, rpt 3 mo [name])  . COLONOSCOPY  06/2011   mult polyps adenomatous, severe diverticulosis, rpt 3 yrs [name])  . CYSTOSCOPY WITH URETHRAL DILATATION N/A 07/24/2013   Procedure: CYSTOSCOPY WITH URETHRAL DILATATION;  Surgeon: [name], MD;  Location: [facility];  Service: Urology;  Laterality: N/A;  .  ESOPHAGOGASTRODUODENOSCOPY  2006   duodenitis, medual HH, Grade 2 reflux, mild gastritis  . ESOPHAGOGASTRODUODENOSCOPY  07/2011   mild chronic gastritis  . Graves  . [place]; 2002   left, then repaired  . MRI brain  04/2010   WNL  . PROSTATE SURGERY  02/2009   PVP (laser)  . SKIN CANCER EXCISION  2002   bridge of nose, BCC  . TONSILLECTOMY AND ADENOIDECTOMY  ~ 1945  . TRANSURETHRAL INCISION OF PROSTATE N/A 07/24/2013   Procedure: TRANSURETHRAL INCISION OF THE PROSTATE (TUIP);  Surgeon: [name], MD;  Location: [facility];  Service: Urology;  Laterality: N/A;  . URETHRAL DILATION  2014   tann[place]baum    There were no vitals filed for this visit.  Subjective Assessment - 02/21/19 1258    Subjective   Would like to work on stepping into his house and over threshold.  Still working on exercises and walking 1/3 mile.      Pertinent History  left subcortical infarct on 03/11/2014. CVA, DM2, neuropathy, restless leg syndrome, HTN    Limitations  Lifting;Standing;Walking;House hold activities    Patient Stated Goals  improve strength of right leg. improve walking & balance without falls.     Currently in Pain?  No/denies                       [facility] Adult PT Treatment/Exercise - 02/21/19 1306      Ambulation/Gait   Ambulation/Gait  Yes    Ambulation/Gait Assistance  4: Min guard    Ambulation/Gait Assistance Details  trial gait with Thuasne R posterior leaf spring with more lateral strut and use of cane with quad tip.  Pt reported improvement pressure he was feeling on medial side of ankle during gait and demonstrated improved closed chain ankle DF during mid stance and activation of knee flexion during swing.    Ambulation Distance (Feet)  230 Feet    Assistive device  Small based quad cane;Straight cane;Other (Comment)    Gait Pattern  Step-through pattern;Decreased arm swing - right;Decreased step length - left;Decreased stance time - right;Decreased stride length;Decreased weight shift to right;Narrow base of support    Ambulation Surface  Level;Indoor    Gait Comments  Simulated pt stepping over threshold to enter house holding rail on L side.  Pt reports catching R foot on threshold and losing his balance at home.  Due to lack of closed chain ankle DF pt experiences sudden retraction of hip pulling his foot back.  Focused on forwards stepping driving knee over foot at heel strike and also side stepping over threshold      Neuro Re-ed    Neuro Re-ed Details   standing on long side of balance beam with RLE on focusing on closed chain ankle DF while stepping through with LLE.  Cues to pause with LLE in the air for increased stance time on R and therapist assisting with maintaining  knee alignment.  Changed to standing behindbeam and stepping R foot fully over beam with full anterior weight shift and translation of tibia over R foot with L foot push off.  From long staggered stance focused on pushing off of RLE to shift back onto LLE with min-mod A for balance during weight shifting             PT Education - 02/21/19 1358    Education Details  trial of Thuasne posterior leaf spring with more  lateral strut to replace current AFO; sequencing of stepping forwards over threshold or lateral stepping over threshold    Person(s) Educated  Patient    Methods  Explanation;Demonstration    Comprehension  Verbalized understanding;Returned demonstration       PT Short Term Goals - 02/17/19 2018      PT SHORT TERM GOAL #1   Title  Patient demostrates understanding of updated HEP. (All STGs Target Date: 03/20/2019)    Time  1    Period  Months    Status  On-going    Target Date  03/20/19      PT SHORT TERM GOAL #2   Title  Patient ambulates 300' with cane with quad tip & AFO with supervision.     Time  1    Period  Months    Status  Revised    Target Date  03/20/19      PT SHORT TERM GOAL #3   Title  Patient negotiates ramps & curbs with cane with quad tip with supervision.     Time  1    Period  Months    Status  Revised    Target Date  03/20/19      PT SHORT TERM GOAL #4   Title  Timed Up & Go with cane with quad tip <17.5 seconds.    Time  1    Period  Months    Status  Revised    Target Date  03/20/19      PT SHORT TERM GOAL #5   Title  Patient verbalizes understanding of new AFO recommendations.     Time  1    Period  Months    Status  New    Target Date  03/20/19        PT Long Term Goals - 02/17/19 2023      PT LONG TERM GOAL #1   Title  Patient verbalizes & demonstrates understanding of ongoing HEP / fitness plan. (All LTGs Target Date: 05/16/2019)    Time  3    Period  Months    Status  On-going    Target Date  05/16/19      PT LONG  TERM GOAL #2   Title  Gait Velocity with cane with quad tip comfortable pace >/= 2.1 ft/sec and fast >/= 2.5 ft/sec    Time  3    Period  Months    Status  Revised    Target Date  05/16/19      PT LONG TERM GOAL #3   Title  Timed Up & Go with cane with quad tip <13.5 seconds to indicate lower fall risk.     Time  3    Period  Months    Status  Revised    Target Date  05/16/19      PT LONG TERM GOAL #4   Title  Dynamic Gait Index with Cane with quad tip >12/24 to indicate lower fall risk    Time  3    Period  Months    Status  Revised    Target Date  05/16/19      PT LONG TERM GOAL #5   Title  Patient negotiates ramps & curbs with cane with quad tip modified independent.     Time  3    Period  Months    Status  Revised    Target Date  05/16/19      Additional Long Term Goals  Additional Long Term Goals  Yes      PT LONG TERM GOAL #6   Title  Cognitive Timed Up & Go with cane with quad tip <20sec & continues naming activity to indicate lower fall risk when distracted    Time  3    Period  Months    Status  New    Target Date  05/16/19            Plan - 02/21/19 1751    Clinical Impression Statement  Continued to discuss other options for R AFO.  Performed gait assessment with posterior leaf spring with lateral strut with mitigation of pressure on medial ankle and equal DF assist and foot clearance compared to patient's current AFO.  Also continued gait training with quad tip cane and training for safer home entry when stepping over threshold.  Continued NMR for more efficient gait sequencing.  Pt tolerated well; will continue to address in order to progress towards LTG.    Personal Factors and Comorbidities  Age;Comorbidity 3+;Time since onset of injury/illness/exacerbation;Other   lives alone   Comorbidities  CVA, DM, neuropathy, HTN, restless leg syndrome    Examination-Activity Limitations  Lift;Locomotion Level;Stairs;Stand;Other   balance    Examination-Participation Restrictions  Community Activity    Stability/Clinical Decision Making  Evolving/Moderate complexity    Rehab Potential  Good    PT Frequency  2x / week    PT Duration  12 weeks    PT Treatment/Interventions  ADLs/Self Care Home Management;DME Instruction;Gait training;Stair training;Functional mobility training;Therapeutic activities;Therapeutic exercise;Balance training;Neuromuscular re-education;Patient/family education;Orthotic Fit/Training;Vestibular    PT Next Visit Plan  we simulated the threshold in // bars and I showed him two ways to safely clear it; we also tried the [facility] which worked very well - he may need new shoes though, they are low and no ankle support.  Continue to work on gait with quad tip cane, dynamic balance, gait with new AFO once consult occurs    Consulted and Agree with Plan of Care  Patient       Patient will benefit from skilled therapeutic intervention in order to improve the following deficits and impairments:  Abnormal gait, Decreased activity tolerance, Decreased balance, Decreased coordination, Decreased endurance, Decreased mobility, Decreased range of motion, Decreased strength, Impaired tone, Impaired flexibility, Postural dysfunction, Other (comment)(orthotic dependency)  Visit Diagnosis: Unsteadiness on feet  Muscle weakness (generalized)  Other abnormalities of gait and mobility  Abnormal posture  Repeated falls  Unsteadiness  Muscle weakness  Abnormality of gait     Problem List Patient Active Problem List   Diagnosis Date Noted  . Type 2 diabetes mellitus with diabetic nephropathy ([place]) 11/29/2018  . Headache 10/30/2018  . Peripheral neuropathy 01/15/2018  . Left wrist pain 01/15/2018  . Hx of basal cell carcinoma 07/10/2017  . Rosacea   . Gait disturbance, post-stroke 10/05/2015  . Presbycusis of both ears   . Advanced care planning/counseling discussion 06/23/2015  . Impaired mobility 04/23/2015   . HTN (hypertension) 03/18/2015  . Chronic diastolic heart failure, NYHA class 1 ([place]) 03/18/2015  . Spastic hemiparesis affecting dominant side ([place]) 07/07/2014  . Chest pressure 02/03/2014  . Medicare annual wellness visit, subsequent 11/06/2013  . Erectile dysfunction due to arterial insufficiency 02/05/2013  . MDD (major depressive disorder), recurrent episode, moderate ([place])   . Palpitations 06/26/2012  . HLD (hyperlipidemia)   . Restless leg syndrome   . GERD (gastroesophageal reflux disease)   . BPH with obstruction/lower urinary tract symptoms   .  History of CVA (cerebrovascular accident) 03/11/2012  . Hemiparesis affecting right side as late effect of stroke ([place]) 03/11/2012  . Type 2 diabetes, controlled, with neuropathy ([place]) 03/11/2012  . OSA on CPAP 03/11/2012  . CKD stage 3 secondary to diabetes ([place]) 03/11/2012  . Migraines 03/11/2012    Rico Junker, PT, DPT 02/21/19    6:01 PM    [facility] [address] [place], [name], [address] Phone: [phone]   Fax:  [phone]  Name: JABRON WEESE, MD MRN: [id number] Date of Birth: 01/01/1931

## 2019-02-24 ENCOUNTER — Other Ambulatory Visit: Payer: Self-pay | Admitting: Family Medicine

## 2019-02-24 ENCOUNTER — Telehealth: Payer: Self-pay | Admitting: *Deleted

## 2019-02-24 MED ORDER — AZELASTINE HCL 0.1 % NA SOLN: 1.0000 | Freq: Two times a day (BID) | NASAL | 6 refills | Status: DC | PRN

## 2019-02-24 NOTE — Telephone Encounter (Signed)
Sent in

## 2019-02-24 NOTE — Telephone Encounter (Signed)
Patient called stating that he would like Dr. Danise Mina to send him a new script in for Azelastine Nasal spray/solution to use prn. Patient stated that he has used this in the past for his seasonal allergies.  Pharmacy [facility]

## 2019-02-26 ENCOUNTER — Encounter: Payer: Self-pay | Admitting: Family Medicine

## 2019-02-26 ENCOUNTER — Ambulatory Visit: Payer: Medicare Other | Admitting: Physical Therapy

## 2019-02-26 ENCOUNTER — Ambulatory Visit (INDEPENDENT_AMBULATORY_CARE_PROVIDER_SITE_OTHER): Payer: Medicare Other | Admitting: Family Medicine

## 2019-02-26 VITALS — BP 120/68 | HR 67 | Temp 95.3°F | Ht 68.0 in | Wt 188.0 lb

## 2019-02-26 DIAGNOSIS — K219 Gastro-esophageal reflux disease without esophagitis: Secondary | ICD-10-CM | POA: Diagnosis not present

## 2019-02-26 DIAGNOSIS — J22 Unspecified acute lower respiratory infection: Secondary | ICD-10-CM | POA: Insufficient documentation

## 2019-02-26 MED ORDER — INSULIN PEN NEEDLE 30G X 8 MM MISC: 3 refills | Status: DC

## 2019-02-26 MED ORDER — AZITHROMYCIN 250 MG PO TABS: ORAL_TABLET | ORAL | 0 refills | Status: DC

## 2019-02-26 NOTE — Progress Notes (Signed)
Virtual visit completed through [place]. Due to national recommendations of social distancing due to COVID-19, a virtual visit is felt to be most appropriate for this patient at this time. Reviewed limitations of a virtual visit.   Patient location: home Provider location: [place] at [facility], office If any vitals were documented, they were collected by patient at home unless specified below.    BP 120/68   Pulse 67   Temp (!) 95.3 F (35.2 C)   Ht 5\' 8"  (1.727 m)   Wt 188 lb (85.3 kg)   SpO2 96%   BMI 28.59 kg/m    CC: cough Subjective:    Patient ID: James Spurr, MD, male    DOB: 25-Nov-1930, 85 y.o.   MRN: [id number]  HPI: James FARABEE, MD is a 85 y.o. male presenting on 02/26/2019 for Cough (C/o productive cough.  Worse when sitting up after lying for awhile.  Denies fever or SOB.  Started a few months ago. )   3 month h/o productive cough worse when sitting up after supine. Initially very mild, more recently progressively worsening. Cough productive of opaque white mucous, more noticeable when he first awakens in the morning. Some PNDrainage. Notes nose congested at night time. Some seasonal allergies, not too bothersome this year. Mild occasional headache. Noticing reduced sense of smell since his stroke. He does get coughing fits, associated with diaphoresis. Cough not keeping him up at night - sleeping ok.   No fevers/chills, ear or tooth pain, ST, body aches. No dyspnea or wheezing. No loss of taste. No diarrhea, abd pain.  No sick contacts at home.   No known h/o asthma or COPD.  Non smoker.   Some residual dysphagia post-stroke.      Relevant past medical, surgical, family and social history reviewed and updated as indicated. Interim medical history since our last visit reviewed. Allergies and medications reviewed and updated. Outpatient Medications Prior to Visit  Medication Sig Dispense Refill  . azelastine (ASTELIN) 0.1 % nasal spray Place 1  spray into both nostrils 2 (two) times daily as needed for rhinitis. Use in each nostril as directed 30 mL 6  . capsaicin (ZOSTRIX) 0.025 % cream Apply topically 2 (two) times daily. 60 g 0  . clopidogrel (PLAVIX) 75 MG tablet TAKE 1 TABLET DAILY 90 tablet 3  . Coenzyme Q10 (COQ10) 150 MG CAPS Take 1 capsule by mouth 2 (two) times daily.    . CRESTOR 40 MG tablet TAKE 1 TABLET DAILY 90 tablet 1  . diazepam (VALIUM) 5 MG tablet Take 1 tablet (5 mg total) by mouth every 8 (eight) hours as needed for muscle spasms. 90 tablet 0  . diclofenac sodium (VOLTAREN) 1 % GEL APPLY 2 GRAMS TOPICALLY FOUR TIMES A DAY 300 g 1  . DULoxetine (CYMBALTA) 60 MG capsule TAKE 1 CAPSULE DAILY 90 capsule 3  . FOLBEE 2.5-25-1 MG TABS tablet TAKE 1 TABLET DAILY 90 tablet 1  . gabapentin (NEURONTIN) 300 MG capsule TAKE 1 CAPSULE IN THE MORNING AND 2 CAPSULES AT NIGHT 270 capsule 3  . ibuprofen (ADVIL,MOTRIN) 200 MG tablet Take 400 mg by mouth every 6 (six) hours as needed for mild pain or moderate pain.    . Insulin Degludec (TRESIBA FLEXTOUCH) 200 UNIT/ML SOPN Inject 60 Units into the skin at bedtime.     Marland Kitchen levothyroxine (LEVOTHROID) 25 MCG tablet Take 1 tablet (25 mcg total) by mouth daily before breakfast. Per endo [name]    .  metoprolol tartrate (LOPRESSOR) 25 MG tablet Take 0.5 tablets (12.5 mg total) by mouth at bedtime. With AM dose if needed 90 tablet 1  . metroNIDAZOLE (METROCREAM) 0.75 % cream Apply 1 application topically 3 (three) times daily.    . nitroGLYCERIN (NITROSTAT) 0.4 MG SL tablet DISSOLVE 1 TABLET UNDER THE TONGUE EVERY 5 MINUTES AS NEEDED FOR CHEST PAIN 25 tablet 1  . omega-3 acid ethyl esters (LOVAZA) 1 g capsule Take 2 capsules (2 g total) by mouth 2 (two) times daily. 360 capsule 1  . pramipexole (MIRAPEX) 1 MG tablet TAKE 2 TABLETS AT 3 P.M. AND 2 TABLETS AT BEDTIME AS NEEDED 360 tablet 0  . RABEprazole (ACIPHEX) 20 MG tablet Take 1 tablet (20 mg total) by mouth daily. 90 tablet 1  . Semaglutide  (OZEMPIC) 0.25 or 0.5 MG/DOSE SOPN Inject into the skin.    Marland Kitchen traZODone (DESYREL) 50 MG tablet Take 50 mg by mouth at bedtime as needed for sleep.    . valsartan (DIOVAN) 40 MG tablet TAKE 1 TABLET DAILY 90 tablet 3  . vitamin C (ASCORBIC ACID) 500 MG tablet Take 1,000 mg by mouth daily.     . Insulin Pen Needle (NOVOFINE) 30G X 8 MM MISC Use to inject insulin once daily dx:E11.40 300 each 3  . pantoprazole (PROTONIX) 40 MG tablet Take 40 mg by mouth daily.     No facility-administered medications prior to visit.      Per HPI unless specifically indicated in ROS section below Review of Systems Objective:    BP 120/68   Pulse 67   Temp (!) 95.3 F (35.2 C)   Ht 5\' 8"  (1.727 m)   Wt 188 lb (85.3 kg)   SpO2 96%   BMI 28.59 kg/m   Wt Readings from Last 3 Encounters:  02/26/19 188 lb (85.3 kg)  01/27/19 189 lb (85.7 kg)  12/23/18 190 lb (86.2 kg)     Physical exam: Gen: alert, NAD, not ill appearing Pulm: speaks in complete sentences without increased work of breathing. No cough during visit.  Psych: normal mood, normal thought content      Results for orders placed or performed in visit on 01/23/19  VITAMIN D 25 Hydroxy (Vit-D Deficiency, Fractures)  Result Value Ref Range   VITD 35.64 30.00 - 100.00 ng/mL  Hemoglobin A1c  Result Value Ref Range   Hgb A1c MFr Bld 7.1 (H) 4.6 - 6.5 %  Comprehensive metabolic panel  Result Value Ref Range   Sodium 140 135 - 145 mEq/L   Potassium 3.8 3.5 - 5.1 mEq/L   Chloride 104 96 - 112 mEq/L   CO2 26 19 - 32 mEq/L   Glucose, Bld 61 (L) 70 - 99 mg/dL   BUN 32 (H) 6 - 23 mg/dL   Creatinine, Ser 1.53 (H) 0.40 - 1.50 mg/dL   Total Bilirubin 0.5 0.2 - 1.2 mg/dL   Alkaline Phosphatase 39 39 - 117 U/L   AST 23 0 - 37 U/L   ALT 19 0 - 53 U/L   Total Protein 7.1 6.0 - 8.3 g/dL   Albumin 3.9 3.5 - 5.2 g/dL   Calcium 8.9 8.4 - 10.5 mg/dL   GFR 43.42 (L) >60.00 mL/min  Lipid panel  Result Value Ref Range   Cholesterol 87 0 - 200 mg/dL    Triglycerides 183.0 (H) 0.0 - 149.0 mg/dL   HDL 17.80 (L) >39.00 mg/dL   VLDL 36.6 0.0 - 40.0 mg/dL   LDL Cholesterol 33  0 - 99 mg/dL   Total CHOL/HDL Ratio 5    NonHDL 69.17    Assessment & Plan:   Problem List Items Addressed This Visit    GERD (gastroesophageal reflux disease)    Now on aciphex, doesn't note much improvement.  Discussed if ineffective, next step would be dexilant.       Acute respiratory infection - Primary    Of prolonged duration - given age and comorbidities, will cover for bacterial or atypical bronchitis with zpack antibiotic. Discussed OTC cough syrup use. Update if not improving with this for CXR in office.  Symptoms not consistent with Covid19.       Relevant Medications   azithromycin (ZITHROMAX) 250 MG tablet       Meds ordered this encounter  Medications  . Insulin Pen Needle (NOVOFINE) 30G X 8 MM MISC    Sig: Use to inject insulin once daily dx:E11.40    Dispense:  300 each    Refill:  3  . azithromycin (ZITHROMAX) 250 MG tablet    Sig: Take two tablets on day one followed by one tablet on days 2-5    Dispense:  6 each    Refill:  0   No orders of the defined types were placed in this encounter.   I discussed the assessment and treatment plan with the patient. The patient was provided an opportunity to ask questions and all were answered. The patient agreed with the plan and demonstrated an understanding of the instructions. The patient was advised to call back or seek an in-person evaluation if the symptoms worsen or if the condition fails to improve as anticipated.  Follow up plan: Return if symptoms worsen or fail to improve.  [name], MD

## 2019-02-26 NOTE — Assessment & Plan Note (Addendum)
Of prolonged duration - given age and comorbidities, will cover for bacterial or atypical bronchitis with zpack antibiotic. Discussed OTC cough syrup use. Update if not improving with this for CXR in office.  Symptoms not consistent with Covid19.

## 2019-02-26 NOTE — Assessment & Plan Note (Signed)
Now on aciphex, doesn't note much improvement.  Discussed if ineffective, next step would be dexilant.

## 2019-03-03 ENCOUNTER — Encounter: Payer: Self-pay | Admitting: Family Medicine

## 2019-03-03 DIAGNOSIS — R05 Cough: Secondary | ICD-10-CM

## 2019-03-03 DIAGNOSIS — R053 Chronic cough: Secondary | ICD-10-CM

## 2019-03-04 ENCOUNTER — Ambulatory Visit: Payer: Medicare Other | Admitting: Physical Therapy

## 2019-03-04 ENCOUNTER — Other Ambulatory Visit: Payer: Self-pay

## 2019-03-04 DIAGNOSIS — M6281 Muscle weakness (generalized): Secondary | ICD-10-CM | POA: Diagnosis not present

## 2019-03-04 DIAGNOSIS — R261 Paralytic gait: Secondary | ICD-10-CM | POA: Diagnosis not present

## 2019-03-04 DIAGNOSIS — R296 Repeated falls: Secondary | ICD-10-CM | POA: Diagnosis not present

## 2019-03-04 DIAGNOSIS — R2689 Other abnormalities of gait and mobility: Secondary | ICD-10-CM

## 2019-03-04 DIAGNOSIS — R2681 Unsteadiness on feet: Secondary | ICD-10-CM | POA: Diagnosis not present

## 2019-03-04 DIAGNOSIS — R293 Abnormal posture: Secondary | ICD-10-CM | POA: Diagnosis not present

## 2019-03-05 ENCOUNTER — Encounter: Payer: Self-pay | Admitting: Physical Therapy

## 2019-03-05 NOTE — Therapy (Signed)
[facility] [address] [place], [name], [address] Phone: [phone]   Fax:  [phone]  Physical Therapy Treatment  Patient Details  Name: James OBEIRNE, MD MRN: [id number] Date of Birth: [birth year] Referring Provider (PT): Charlett Blake MD  CLINIC OPERATION CHANGES: Outpatient Neuro Rehab is open at lower capacity following universal masking, social distancing, and patient screening.  The patient's COVID risk of  complications score is 7.   Encounter Date: 03/04/2019  PT End of Session - 03/05/19 1917    Visit Number  6    Number of Visits  29    Date for PT Re-Evaluation  05/16/19    Authorization Type  Medicare & Tricare    Authorization Time Period  T[employer]are will pick up 100% after Medicare    PT Start Time  1401    PT Stop Time  1449    PT Time Calculation (min)  48 min    Equipment Utilized During Treatment  Gait belt    Activity Tolerance  Treatment limited secondary to medical complications (Comment);Other (comment)   c/o increased Rt knee instability since fall sustained last week   Behavior During Therapy  [facility] for tasks assessed/performed       Past Medical History:  Diagnosis Date  . Anemia   . Basal cell carcinoma 2018   R temple   . Basal cell carcinoma of nose 2002   bridge of nose  . BPH with obstruction/lower urinary tract symptoms    failed flomax, uroxatral, myrbetriq - sees urology [name]) pending videourodynamics  . CKD (chronic kidney disease) stage 3, GFR 30-59 ml/min (HCC) 03/11/2012   Renal US 02/2014 - multiple kidney cysts   . CVA (cerebral infarction) 03/07/12   slurred speech; right hemplegia, left handed - completed PT 07/2014 (17 sessions)  . Depression    mild, on cymbalta per prior PCP  . Erectile dysfunction    per urology (prostaglandin injection)  . GERD (gastroesophageal reflux disease)    with sliding HH  . H/O hiatal hernia   . History of kidney  problems 12/2010   lacerated left kidney after fall  . HLD (hyperlipidemia)   . Hx of basal cell carcinoma 07/10/2017   R. lat. forehead near hair line  . Kidney cysts 02/2014   bilateral (renal US)  . Migraines 03/11/12   now rare  . OSA on CPAP    sleep study 01/2014 - severe, CPAP at 10, previously on nuvigil (Dr. [name])  . Palpitations 01/2012   holter WNL, rare PAC/PVCs  . Presbycusis of both ears 2016   rec amplification [facility])  . Restless leg syndrome    well controlled on mirapex  . Rosacea    [place]  . TIA (transient ischemic attack)   . Type 2 diabetes, controlled, with neuropathy [facility]) 2011   [name] in [facility]    Past Surgical History:  Procedure Laterality Date  . ABI  2007   WNL  . CARDIAC CATHETERIZATION  2004   normal; [place]  . CARDIOVASCULAR STRESS TEST  02/2014   WNL, low risk scan, EF 68%  . CAROTID ENDARTERECTOMY    . CATARACT EXTRACTION W/ INTRAOCULAR LENS IMPLANT  11//2012 - 08/2011  . COLONOSCOPY  03/2011   poor prep, unable to biopsy polyp in tic fold, rpt 3 mo [name])  . COLONOSCOPY  06/2011   mult polyps adenomatous, severe diverticulosis, rpt 3 yrs [name])  . CYSTOSCOPY WITH URETHRAL DILATATION N/A  07/24/2013   Procedure: CYSTOSCOPY WITH URETHRAL DILATATION;  Surgeon: [name], MD;  Location: [facility];  Service: Urology;  Laterality: N/A;  . ESOPHAGOGASTRODUODENOSCOPY  2006   duodenitis, medual HH, Grade 2 reflux, mild gastritis  . ESOPHAGOGASTRODUODENOSCOPY  07/2011   mild chronic gastritis  . [place]  . [place]; 2002   left, then repaired  . MRI brain  04/2010   WNL  . PROSTATE SURGERY  02/2009   PVP (laser)  . SKIN CANCER EXCISION  2002   bridge of nose, BCC  . TONSILLECTOMY AND ADENOIDECTOMY  ~ 1945  . TRANSURETHRAL INCISION OF PROSTATE N/A 07/24/2013   Procedure: TRANSURETHRAL INCISION OF THE PROSTATE (TUIP);  Surgeon: [name], MD;  Location: [facility];  Service: Urology;   Laterality: N/A;  . URETHRAL DILATION  2014   t[place]    There were no vitals filed for this visit.  Subjective Assessment - 03/04/19 1402    Subjective  Pt states he fell out of wheelchair in the garden - slipped down into puddle of water of ground    Pertinent History  left subcortical infarct on 03/11/2014. CVA, DM2, neuropathy, restless leg syndrome, HTN    Limitations  Lifting;Standing;Walking;House hold activities    Patient Stated Goals  improve strength of right leg. improve walking & balance without falls.     Currently in Pain?  Yes    Pain Score  2     Pain Location  Leg    Pain Orientation  Right;Lower    Pain Descriptors / Indicators  Aching    Pain Type  Acute pain   pain in RLE since fall last week   Pain Onset  1 to 4 weeks ago    Pain Frequency  Intermittent                       [facility] Adult PT Treatment/Exercise - 03/05/19 0001      Transfers   Transfers  Sit to Stand    Sit to Stand  4: Min assist;4: Min guard    Stand to Sit  4: Min guard    Number of Reps  Other reps (comment)   5   Comments  Lt foot on balance bubble for increased RLE weight bearing/strengthening      Ambulation/Gait   Ambulation/Gait  Yes    Ambulation/Gait Assistance  4: Min guard    Ambulation/Gait Assistance Details  Richardson Landry, Hospital doctor, present for orthotic consult     Ambulation Distance (Feet)  115 Feet    Assistive device  Small based quad cane;Straight cane;Other (Comment)    Gait Pattern  Step-through pattern;Decreased arm swing - right;Decreased step length - left;Decreased stance time - right;Decreased stride length;Decreased weight shift to right;Narrow base of support    Ambulation Surface  Level;Indoor      Exercises   Exercises  Lumbar;Knee/Hip;Ankle      Knee/Hip Exercises: Standing   Forward Step Up  Right;1 set;10 reps;Hand Hold: 2;Step Height: 6"    Step Down  Right;1 set;10 reps;Hand Hold: 2;Step Height: 6"    Functional Squat  1  set;10 reps      NeuroRe-ed;  SLS activities - touching balance bubbles as targets with each foot with UE support prn with min assist Stepping over and back of orange hurdle 10 reps each leg with UE support on counter with min assist  Marching forwards and backwards along counter  with 1 UE support with min guard assist       PT Short Term Goals - 03/05/19 1922      PT SHORT TERM GOAL #1   Title  Patient demostrates understanding of updated HEP. (All STGs Target Date: 03/20/2019)    Time  1    Period  Months    Status  On-going    Target Date  03/20/19      PT SHORT TERM GOAL #2   Title  Patient ambulates 300' with cane with quad tip & AFO with supervision.     Time  1    Period  Months    Status  Revised    Target Date  03/20/19      PT SHORT TERM GOAL #3   Title  Patient negotiates ramps & curbs with cane with quad tip with supervision.     Time  1    Period  Months    Status  Revised    Target Date  03/20/19      PT SHORT TERM GOAL #4   Title  Timed Up & Go with cane with quad tip <17.5 seconds.    Time  1    Period  Months    Status  Revised    Target Date  03/20/19      PT SHORT TERM GOAL #5   Title  Patient verbalizes understanding of new AFO recommendations.     Time  1    Period  Months    Status  New    Target Date  03/20/19        PT Long Term Goals - 03/05/19 1923      PT LONG TERM GOAL #1   Title  Patient verbalizes & demonstrates understanding of ongoing HEP / fitness plan. (All LTGs Target Date: 05/16/2019)    Time  3    Period  Months    Status  On-going      PT LONG TERM GOAL #2   Title  Gait Velocity with cane with quad tip comfortable pace >/= 2.1 ft/sec and fast >/= 2.5 ft/sec    Time  3    Period  Months    Status  Revised      PT LONG TERM GOAL #3   Title  Timed Up & Go with cane with quad tip <13.5 seconds to indicate lower fall risk.     Time  3    Period  Months    Status  Revised      PT LONG TERM GOAL #4   Title  Dynamic  Gait Index with Cane with quad tip >12/24 to indicate lower fall risk    Time  3    Period  Months    Status  Revised      PT LONG TERM GOAL #5   Title  Patient negotiates ramps & curbs with cane with quad tip modified independent.     Time  3    Period  Months    Status  Revised      PT LONG TERM GOAL #6   Title  Cognitive Timed Up & Go with cane with quad tip <20sec & continues naming activity to indicate lower fall risk when distracted    Time  3    Period  Months    Status  New            Plan - 03/05/19 1919    Clinical Impression Statement  Townsend AFO recommended by orthotist Richardson Landry) from [place]: this AFO has posterior strut which pt says is more comfortable as his current AFO has medial strut which compresses his Rt medial ankle due to edema.  Pt has decreased Rt SLS with c/o increased Rt knee instbility since fall sustained last week and also pt reports decr. confidence with now incr. fear of falling since fall sustained.    Personal Factors and Comorbidities  Age;Comorbidity 3+;Time since onset of injury/illness/exacerbation;Other   lives alone   Comorbidities  CVA, DM, neuropathy, HTN, restless leg syndrome    Examination-Activity Limitations  Lift;Locomotion Level;Stairs;Stand;Other   balance   Examination-Participation Restrictions  Community Activity    Stability/Clinical Decision Making  Evolving/Moderate complexity    Rehab Potential  Good    PT Frequency  2x / week    PT Duration  12 weeks    PT Treatment/Interventions  ADLs/Self Care Home Management;DME Instruction;Gait training;Stair training;Functional mobility training;Therapeutic activities;Therapeutic exercise;Balance training;Neuromuscular re-education;Patient/family education;Orthotic Fit/Training;Vestibular    PT Next Visit Plan  we simulated the threshold in // bars and I showed him two ways to safely clear it; we also tried the [facility] which worked very well - he may need new shoes though, they  are low and no ankle support.  Continue to work on gait with quad tip cane, dynamic balance, gait with new AFO once consult occurs    Recommended Other Services  will send inbasket message to MD for order for AFO    Consulted and Agree with Plan of Care  Patient       Patient will benefit from skilled therapeutic intervention in order to improve the following deficits and impairments:  Abnormal gait, Decreased activity tolerance, Decreased balance, Decreased coordination, Decreased endurance, Decreased mobility, Decreased range of motion, Decreased strength, Impaired tone, Impaired flexibility, Postural dysfunction, Other (comment)(orthotic dependency)  Visit Diagnosis: 1. Other abnormalities of gait and mobility   2. Unsteadiness on feet   3. Muscle weakness (generalized)        Problem List Patient Active Problem List   Diagnosis Date Noted  . Acute respiratory infection 02/26/2019  . Type 2 diabetes mellitus with diabetic nephropathy ([place]) 11/29/2018  . Headache 10/30/2018  . Peripheral neuropathy 01/15/2018  . Left wrist pain 01/15/2018  . Hx of basal cell carcinoma 07/10/2017  . Rosacea   . Gait disturbance, post-stroke 10/05/2015  . Presbycusis of both ears   . Advanced care planning/counseling discussion 06/23/2015  . Impaired mobility 04/23/2015  . HTN (hypertension) 03/18/2015  . Chronic diastolic heart failure, NYHA class 1 ([place]) 03/18/2015  . Spastic hemiparesis affecting dominant side ([place]) 07/07/2014  . Chest pressure 02/03/2014  . Medicare annual wellness visit, subsequent 11/06/2013  . Erectile dysfunction due to arterial insufficiency 02/05/2013  . MDD (major depressive disorder), recurrent episode, moderate ([place])   . Palpitations 06/26/2012  . HLD (hyperlipidemia)   . Restless leg syndrome   . GERD (gastroesophageal reflux disease)   . BPH with obstruction/lower urinary tract symptoms   . History of CVA (cerebrovascular accident) 03/11/2012  . Hemiparesis  affecting right side as late effect of stroke ([place]) 03/11/2012  . Type 2 diabetes, controlled, with neuropathy ([place]) 03/11/2012  . OSA on CPAP 03/11/2012  . CKD stage 3 secondary to diabetes ([place]) 03/11/2012  . Migraines 03/11/2012    Dild[place]Jenness Corner, PT 03/05/2019, 7:25 PM  [facility] [address] [place], [name], [address] Phone: [phone]   Fax:  [phone]  Name: CLARENCE COGSWELL, MD MRN: [id number] Date of Birth: 07/10/1931

## 2019-03-05 NOTE — Telephone Encounter (Addendum)
Pt called to ck on status of update of his condition. Notified pt of Dr Synthia Innocent instructions. Pt voiced understanding. No covid symptoms except cough and Dr Darnell Level is aware,no travel and no known exposure to known covid. Pt will come to back parking lot and follow the side walk to the back door of building; pt will wear mask and will come to [facility] 03/06/19 at 9 AM. FYI to Dr Darnell Level.

## 2019-03-06 ENCOUNTER — Ambulatory Visit: Payer: Medicare Other | Admitting: Physical Therapy

## 2019-03-06 ENCOUNTER — Other Ambulatory Visit: Payer: Self-pay

## 2019-03-06 ENCOUNTER — Ambulatory Visit (INDEPENDENT_AMBULATORY_CARE_PROVIDER_SITE_OTHER)
Admission: RE | Admit: 2019-03-06 | Discharge: 2019-03-06 | Disposition: A | Discharge status: Home / Self Care | Payer: Medicare Other | Source: Ambulatory Visit | Attending: Family Medicine | Admitting: Family Medicine

## 2019-03-06 DIAGNOSIS — R2681 Unsteadiness on feet: Secondary | ICD-10-CM

## 2019-03-06 DIAGNOSIS — R05 Cough: Secondary | ICD-10-CM

## 2019-03-06 DIAGNOSIS — R2689 Other abnormalities of gait and mobility: Secondary | ICD-10-CM

## 2019-03-06 DIAGNOSIS — R261 Paralytic gait: Secondary | ICD-10-CM | POA: Diagnosis not present

## 2019-03-06 DIAGNOSIS — R293 Abnormal posture: Secondary | ICD-10-CM | POA: Diagnosis not present

## 2019-03-06 DIAGNOSIS — M6281 Muscle weakness (generalized): Secondary | ICD-10-CM

## 2019-03-06 DIAGNOSIS — R296 Repeated falls: Secondary | ICD-10-CM | POA: Diagnosis not present

## 2019-03-07 ENCOUNTER — Telehealth: Payer: Self-pay | Admitting: Physical Therapy

## 2019-03-07 NOTE — Telephone Encounter (Signed)
Dr. 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  [name] will benefit from use of right AFO in order to improve safety with functional mobility. He currently has an [employer] On AFO which is in disrepair due to having been worn for several years.  A Townsend AFO has been recommended by orthotist, Richardson Landry, from [place].  If you agree, please submit request in EPIC under MD Order, Other Orders (list right AFO in comments) or fax to [facility] at [phone].   Thank you, [name], [name] [address] [place], [place]  [address] Phone:  [phone] Fax:  [phone]

## 2019-03-07 NOTE — Therapy (Signed)
[facility] [address] [place], [name], [address] Phone: [phone]   Fax:  [phone]  Physical Therapy Treatment  Patient Details  Name: James VANDYKE, MD MRN: [id number] Date of Birth: 1931/01/11 Referring Provider (PT): Charlett Blake MD  CLINIC OPERATION CHANGES: [facility]patient [facility] is open at lower capacity following universal masking, social distancing, and patient screening.  The patient's COVID risk  of complications score is 7.  Encounter Date: 03/06/2019   PT End of Session - 03/07/19 1207    Visit Number  7    Number of Visits  29    Date for PT Re-Evaluation  05/16/19    Authorization Type  Medicare & T[employer]are    Authorization Time Period  [employer] will pick up 100% after Medicare    PT Start Time  1501    PT Stop Time  1548    PT Time Calculation (min)  47 min    Equipment Utilized During Treatment  Gait belt    Activity Tolerance  Patient tolerated treatment well   c/o increased Rt knee instability since fall sustained last week   Behavior During Therapy  WFL for tasks assessed/performed       Past Medical History:  Diagnosis Date  . Anemia   . Basal cell carcinoma 2018   R temple   . Basal cell carcinoma of nose 2002   bridge of nose  . BPH with obstruction/lower urinary tract symptoms    failed flomax, uroxatral, myrbetriq - sees urology [name]) pending videourodynamics  . CKD (chronic kidney disease) stage 3, GFR 30-59 ml/min (HCC) 03/11/2012   Renal US 02/2014 - multiple kidney cysts   . CVA (cerebral infarction) 03/07/12   slurred speech; right hemplegia, left handed - completed PT 07/2014 (17 sessions)  . Depression    mild, on cymbalta per prior PCP  . Erectile dysfunction    per urology (prostaglandin injection)  . GERD (gastroesophageal reflux disease)    with sliding HH  . H/O hiatal hernia   . History of kidney problems 12/2010   lacerated left kidney after  fall  . HLD (hyperlipidemia)   . Hx of basal cell carcinoma 07/10/2017   R. lat. forehead near hair line  . Kidney cysts 02/2014   bilateral (renal US)  . Migraines 03/11/12   now rare  . OSA on CPAP    sleep study 01/2014 - severe, CPAP at 10, previously on nuvigil (Dr. [name])  . Palpitations 01/2012   holter WNL, rare PAC/PVCs  . Presbycusis of both ears 2016   rec amplification [facility])  . Restless leg syndrome    well controlled on mirapex  . Rosacea    [place]  . TIA (transient ischemic attack)   . Type 2 diabetes, controlled, with neuropathy [facility]) 2011   [name] in [facility]    Past Surgical History:  Procedure Laterality Date  . ABI  2007   WNL  . CARDIAC CATHETERIZATION  2004   normal; [place]  . CARDIOVASCULAR STRESS TEST  02/2014   WNL, low risk scan, EF 68%  . CAROTID ENDARTERECTOMY    . CATARACT EXTRACTION W/ INTRAOCULAR LENS IMPLANT  11//2012 - 08/2011  . COLONOSCOPY  03/2011   poor prep, unable to biopsy polyp in tic fold, rpt 3 mo [name])  . COLONOSCOPY  06/2011   mult polyps adenomatous, severe diverticulosis, rpt 3 yrs [name])  . CYSTOSCOPY WITH URETHRAL DILATATION N/A 07/24/2013   Procedure:  CYSTOSCOPY WITH URETHRAL DILATATION;  Surgeon: [name], MD;  Location: [facility];  Service: Urology;  Laterality: N/A;  . ESOPHAGOGASTRODUODENOSCOPY  2006   duodenitis, medual HH, Grade 2 reflux, mild gastritis  . ESOPHAGOGASTRODUODENOSCOPY  07/2011   mild chronic gastritis  . [place]  . [place]; 2002   left, then repaired  . MRI brain  04/2010   WNL  . PROSTATE SURGERY  02/2009   PVP (laser)  . SKIN CANCER EXCISION  2002   bridge of nose, BCC  . TONSILLECTOMY AND ADENOIDECTOMY  ~ 1945  . TRANSURETHRAL INCISION OF PROSTATE N/A 07/24/2013   Procedure: TRANSURETHRAL INCISION OF THE PROSTATE (TUIP);  Surgeon: [name], MD;  Location: [facility];  Service: Urology;  Laterality: N/A;  . URETHRAL DILATION  2014    tannenbaum    There were no vitals filed for this visit.  Subjective Assessment - 03/06/19 1505    Subjective  Pt states his Rt knee is feeling better today - not as weak as it felt on Tues.    Pertinent History  left subcortical infarct on 03/11/2014. CVA, DM2, neuropathy, restless leg syndrome, HTN    Limitations  Lifting;Standing;Walking;House hold activities    Patient Stated Goals  improve strength of right leg. improve walking & balance without falls.     Currently in Pain?  No/denies    Pain Onset  --                       [facility] Adult PT Treatment/Exercise - 03/07/19 0001      Transfers   Transfers  Sit to Stand    Stand to Sit  4: Min guard    Number of Reps  10 reps    Comments  no UE support - from mat      Ambulation/Gait   Ambulation/Gait  Yes    Ambulation/Gait Assistance  4: Min guard    Ambulation Distance (Feet)  230 Feet   115' x 1 rep with each cane   Assistive device  Other (Comment);Straight cane   cane with rubber quad tip & cane with tripod base   Gait Pattern  Step-through pattern;Decreased arm swing - right;Decreased step length - left;Decreased stance time - right;Decreased stride length;Decreased weight shift to right;Narrow base of support    Ambulation Surface  Level;Indoor    Stairs  --    Ramp  5: Supervision   with rubber quad tip cane   Curb  5: Supervision   CGA for safety - with rubber quad tip cane     Knee/Hip Exercises: Machines for Strengthening   Cybex Leg Press  bil. LE's 60# 10 reps:  RLE only 40# 2 sets 10 reps      Knee/Hip Exercises: Standing   Forward Step Up  Right;1 set;10 reps;Hand Hold: 2;Step Height: 6"    Step Down  Right;1 set;10 reps;Hand Hold: 2;Step Height: 6"          Balance Exercises - 03/07/19 1206      Balance Exercises: Standing   Rockerboard  Anterior/posterior;Other reps (comment);UE support   3 sets 10 reps with bil. UE support inside // bars   Other Standing Exercises  stepping  over and back of balance beam 10 reps with LLE for improved RLE weight bearing with UE support;          PT Short Term Goals - 03/07/19 1213  PT SHORT TERM GOAL #1   Title  Patient demostrates understanding of updated HEP. (All STGs Target Date: 03/20/2019)    Time  1    Period  Months    Status  On-going    Target Date  03/20/19      PT SHORT TERM GOAL #2   Title  Patient ambulates 300' with cane with quad tip & AFO with supervision.     Time  1    Period  Months    Status  Revised    Target Date  03/20/19      PT SHORT TERM GOAL #3   Title  Patient negotiates ramps & curbs with cane with quad tip with supervision.     Time  1    Period  Months    Status  Revised    Target Date  03/20/19      PT SHORT TERM GOAL #4   Title  Timed Up & Go with cane with quad tip <17.5 seconds.    Time  1    Period  Months    Status  Revised    Target Date  03/20/19      PT SHORT TERM GOAL #5   Title  Patient verbalizes understanding of new AFO recommendations.     Time  1    Period  Months    Status  New    Target Date  03/20/19        PT Long Term Goals - 03/07/19 1213      PT LONG TERM GOAL #1   Title  Patient verbalizes & demonstrates understanding of ongoing HEP / fitness plan. (All LTGs Target Date: 05/16/2019)    Time  3    Period  Months    Status  On-going      PT LONG TERM GOAL #2   Title  Gait Velocity with cane with quad tip comfortable pace >/= 2.1 ft/sec and fast >/= 2.5 ft/sec    Time  3    Period  Months    Status  Revised      PT LONG TERM GOAL #3   Title  Timed Up & Go with cane with quad tip <13.5 seconds to indicate lower fall risk.     Time  3    Period  Months    Status  Revised      PT LONG TERM GOAL #4   Title  Dynamic Gait Index with Cane with quad tip >12/24 to indicate lower fall risk    Time  3    Period  Months    Status  Revised      PT LONG TERM GOAL #5   Title  Patient negotiates ramps & curbs with cane with quad tip modified  independent.     Time  3    Period  Months    Status  Revised      PT LONG TERM GOAL #6   Title  Cognitive Timed Up & Go with cane with quad tip <20sec & continues naming activity to indicate lower fall risk when distracted    Time  3    Period  Months    Status  New            Plan - 03/07/19 1208    Clinical Impression Statement  Increased Rt knee stability noted today compared to performance during PT session on Tues., 03-04-19 with pt feeling more confident in weight bearing on RLE;  rubber quad tip cane provided more stability during gait than the tripod based cane with increased safety and more steady gait noted; pt stated he preferred the rubber quad tip cane over the tripod based cane due to increased stability    Personal Factors and Comorbidities  Age;Comorbidity 3+;Time since onset of injury/illness/exacerbation;Other   lives alone   Comorbidities  CVA, DM, neuropathy, HTN, restless leg syndrome    Examination-Activity Limitations  Lift;Locomotion Level;Stairs;Stand;Other   balance   Examination-Participation Restrictions  Community Activity    Stability/Clinical Decision Making  Evolving/Moderate complexity    Rehab Potential  Good    PT Frequency  2x / week    PT Duration  12 weeks    PT Treatment/Interventions  ADLs/Self Care Home Management;DME Instruction;Gait training;Stair training;Functional mobility training;Therapeutic activities;Therapeutic exercise;Balance training;Neuromuscular re-education;Patient/family education;Orthotic Fit/Training;Vestibular    PT Next Visit Plan  cont balance and gait training    Recommended Other Services  message sent for AFO request to Dr. Letta Pate on 03-07-19    Consulted and Agree with Plan of Care  Patient       Patient will benefit from skilled therapeutic intervention in order to improve the following deficits and impairments:  Abnormal gait, Decreased activity tolerance, Decreased balance, Decreased coordination, Decreased  endurance, Decreased mobility, Decreased range of motion, Decreased strength, Impaired tone, Impaired flexibility, Postural dysfunction, Other (comment)(orthotic dependency)  Visit Diagnosis: 1. Other abnormalities of gait and mobility   2. Unsteadiness on feet   3. Muscle weakness (generalized)        Problem List Patient Active Problem List   Diagnosis Date Noted  . Acute respiratory infection 02/26/2019  . Type 2 diabetes mellitus with diabetic nephropathy ([place]) 11/29/2018  . Headache 10/30/2018  . Peripheral neuropathy 01/15/2018  . Left wrist pain 01/15/2018  . Hx of basal cell carcinoma 07/10/2017  . Rosacea   . Gait disturbance, post-stroke 10/05/2015  . Presbycusis of both ears   . Advanced care planning/counseling discussion 06/23/2015  . Impaired mobility 04/23/2015  . HTN (hypertension) 03/18/2015  . Chronic diastolic heart failure, NYHA class 1 ([place]) 03/18/2015  . Spastic hemiparesis affecting dominant side ([place]) 07/07/2014  . Chest pressure 02/03/2014  . Medicare annual wellness visit, subsequent 11/06/2013  . Erectile dysfunction due to arterial insufficiency 02/05/2013  . MDD (major depressive disorder), recurrent episode, moderate ([place])   . Palpitations 06/26/2012  . HLD (hyperlipidemia)   . Restless leg syndrome   . GERD (gastroesophageal reflux disease)   . BPH with obstruction/lower urinary tract symptoms   . History of CVA (cerebrovascular accident) 03/11/2012  . Hemiparesis affecting right side as late effect of stroke ([place]) 03/11/2012  . Type 2 diabetes, controlled, with neuropathy ([place]) 03/11/2012  . OSA on CPAP 03/11/2012  . CKD stage 3 secondary to diabetes ([place]) 03/11/2012  . Migraines 03/11/2012    DildayJenness Corner, PT 03/07/2019, 12:15 PM  [facility] [address] [place], [name], [address] Phone: [phone]   Fax:  [phone]  Name: NICKHOLAS GOLDSTON, MD MRN:  [id number] Date of Birth: 06-Oct-1930

## 2019-03-07 NOTE — Telephone Encounter (Signed)
Order faxed to outpt neuro rehab (hand written on [employer] order form).

## 2019-03-07 NOTE — Telephone Encounter (Signed)
Ok to order R AFO from [employer] D[employer] acquired R foot drop

## 2019-03-10 NOTE — Progress Notes (Signed)
NEUROLOGY CONSULTATION NOTE  James Spurr, MD MRN: [id number] DOB: 12-31-1930  Referring provider: [name], MD Primary care provider: [name], MD  Reason for consult:  Headaches, gait instability  HISTORY OF PRESENT ILLNESS: James. Geovani Bell is an 85 year old left-handed Caucasian man with type 2 diabetes mellitus with neuropathy, right-sided hemiplegia as late effect of stroke (02/2012), OSA on CPAP and history of migraines who presents for headaches and unsteady gait.  History supplemented by prior neurology and referring provider notes.  In December 2019, he started having headaches, described as dull non-throbbing holocephalic headache upon waking up in the morning.  He wakes up in the morning with them and they typically last a couple hours and occur 5 days a week.  They are not associated with nausea, photophobia, phonophobia or unilateral numbness and weakness  He also nods off often during the day.  He has OSA and uses a CPAP.  For several years, he has had some mild infrequent episodes of intermittent double vision, which occur later in the day.  However, over the past several months, it has gotten worse. He has residual right sided hemiplegia from stroke in 2013 and started feeling weaker on the right sided with increased falls.  He had an MRI of the brain with and without contrast on 10/26/18, which was personally reviewed and showed chronic infarct involving the left corona radiata and lentiform nucleus, unchanged, but no new, acute or subacute intracranial abnormalities.  Sed rate was normal.  He has type 2 diabetes with known polyneuropathy.  Over the past 8 months, his Hgb A1c has steadily increased from 5.8 to 7.1.    His left hemispheric stroke occurred in June 2013.  Since then, he has had "TIAs" requiring repeat imaging.  MRI and MRA of head was performed on 03/18/15 to evaluate worsening right sided weakness and abnormal speech and showed no acute  changes.  MRI of brain with and without contrast from 01/14/18 to evaluate episode of diplopia was also negative for acute changes.  Current NSAIDs:  Ibuprofen Current antihypertensive:  Lopressor, Diovan Current antidepressants:  Cymbalta 60mg  Current antiepileptic:  Gabapentin 300mg  in AM and 600mg  in PM Other medications:  Mirapex, Afr0an   Past antidepressant:  Sertraline  PAST MEDICAL HISTORY: Past Medical History:  Diagnosis Date   Anemia    Basal cell carcinoma 2018   R temple    Basal cell carcinoma of nose 2002   bridge of nose   BPH with obstruction/lower urinary tract symptoms    failed flomax, uroxatral, myrbetriq - sees urology James Bell) pending videourodynamics   CKD (chronic kidney disease) stage 3, GFR 30-59 ml/min ([place]) 03/11/2012   Renal US 02/2014 - multiple kidney cysts    CVA (cerebral infarction) 03/07/12   slurred speech; right hemplegia, left handed - completed PT 07/2014 (17 sessions)   Depression    mild, on cymbalta per prior PCP   Erectile dysfunction    per urology (prostaglandin injection)   GERD (gastroesophageal reflux disease)    with sliding HH   H/O hiatal hernia    History of kidney problems 12/2010   lacerated left kidney after fall   HLD (hyperlipidemia)    Hx of basal cell carcinoma 07/10/2017   R. lat. forehead near hair line   Kidney cysts 02/2014   bilateral (renal US)   Migraines 03/11/12   now rare   OSA on CPAP    sleep study 01/2014 - severe, CPAP at 10, previously  on nuvigil (James. Lucienne Bell)   Palpitations 01/2012   holter WNL, rare PAC/PVCs   Presbycusis of both ears 2016   rec amplification James Bell)   Restless leg syndrome    well controlled on mirapex   Rosacea    James Bell   TIA (transient ischemic attack)    Type 2 diabetes, controlled, with neuropathy (James Bell) 2011   James Bell in [place]: Past Surgical History:  Procedure Laterality Date   ABI  2007   WNL   CARDIAC  CATHETERIZATION  2004   normal; [facility]   CARDIOVASCULAR STRESS TEST  02/2014   WNL, low risk scan, EF 68%   CAROTID ENDARTERECTOMY     CATARACT EXTRACTION W/ INTRAOCULAR LENS IMPLANT  11//2012 - 08/2011   COLONOSCOPY  03/2011   poor prep, unable to biopsy polyp in tic fold, rpt 3 mo James Bell)   COLONOSCOPY  06/2011   mult polyps adenomatous, severe diverticulosis, rpt 3 yrs James Bell)   CYSTOSCOPY WITH URETHRAL DILATATION N/A 07/24/2013   Procedure: CYSTOSCOPY WITH URETHRAL DILATATION;  Surgeon: James Rud, MD;  Location: [facility];  Service: Urology;  Laterality: N/A;   ESOPHAGOGASTRODUODENOSCOPY  2006   duodenitis, medual HH, Grade 2 reflux, mild gastritis   ESOPHAGOGASTRODUODENOSCOPY  07/2011   mild chronic gastritis   HEMORRHOID SURGERY  1991   INGUINAL HERNIA REPAIR  1981; 2002   left, then repaired   MRI brain  04/2010   WNL   PROSTATE SURGERY  02/2009   PVP (laser)   SKIN CANCER EXCISION  2002   bridge of nose, [facility]   TONSILLECTOMY AND ADENOIDECTOMY  ~ [place] N/A 07/24/2013   Procedure: TRANSURETHRAL INCISION OF THE PROSTATE (TUIP);  Surgeon: James Rud, MD;  Location: [facility];  Service: Urology;  Laterality: N/A;   URETHRAL DILATION  2014   [facility]    MEDICATIONS: Current Outpatient Medications on File Prior to Visit  Medication Sig Dispense Refill   azelastine (ASTELIN) 0.1 % nasal spray Place 1 spray into both nostrils 2 (two) times daily as needed for rhinitis. Use in each nostril as directed 30 mL 6   azithromycin (ZITHROMAX) 250 MG tablet Take two tablets on day one followed by one tablet on days 2-5 6 each 0   capsaicin (ZOSTRIX) 0.025 % cream Apply topically 2 (two) times daily. 60 g 0   clopidogrel (PLAVIX) 75 MG tablet TAKE 1 TABLET DAILY 90 tablet 3   Coenzyme Q10 (COQ10) 150 MG CAPS Take 1 capsule by mouth 2 (two) times daily.     CRESTOR 40 MG tablet TAKE 1 TABLET DAILY 90 tablet 1    diazepam (VALIUM) 5 MG tablet Take 1 tablet (5 mg total) by mouth every 8 (eight) hours as needed for muscle spasms. 90 tablet 0   diclofenac sodium (VOLTAREN) 1 % GEL APPLY 2 GRAMS TOPICALLY FOUR TIMES A DAY 300 g 1   DULoxetine (CYMBALTA) 60 MG capsule TAKE 1 CAPSULE DAILY 90 capsule 3   FOLBEE 2.5-25-1 MG TABS tablet TAKE 1 TABLET DAILY 90 tablet 1   gabapentin (NEURONTIN) 300 MG capsule TAKE 1 CAPSULE IN THE MORNING AND 2 CAPSULES AT NIGHT 270 capsule 3   ibuprofen (ADVIL,MOTRIN) 200 MG tablet Take 400 mg by mouth every 6 (six) hours as needed for mild pain or moderate pain.     Insulin Degludec (TRESIBA FLEXTOUCH) 200 UNIT/ML SOPN Inject 60 Units into the skin at bedtime.  Insulin Pen Needle (NOVOFINE) 30G X 8 MM MISC Use to inject insulin once daily dx:E11.40 300 each 3   levothyroxine (LEVOTHROID) 25 MCG tablet Take 1 tablet (25 mcg total) by mouth daily before breakfast. Per endo James Bell     metoprolol tartrate (LOPRESSOR) 25 MG tablet Take 0.5 tablets (12.5 mg total) by mouth at bedtime. With AM dose if needed 90 tablet 1   metroNIDAZOLE (METROCREAM) 0.75 % cream Apply 1 application topically 3 (three) times daily.     nitroGLYCERIN (NITROSTAT) 0.4 MG SL tablet DISSOLVE 1 TABLET UNDER THE TONGUE EVERY 5 MINUTES AS NEEDED FOR CHEST PAIN 25 tablet 1   omega-3 acid ethyl esters (LOVAZA) 1 g capsule Take 2 capsules (2 g total) by mouth 2 (two) times daily. 360 capsule 1   pramipexole (MIRAPEX) 1 MG tablet TAKE 2 TABLETS AT 3 P.M. AND 2 TABLETS AT BEDTIME AS NEEDED 360 tablet 0   RABEprazole (ACIPHEX) 20 MG tablet Take 1 tablet (20 mg total) by mouth daily. 90 tablet 1   Semaglutide (OZEMPIC) 0.25 or 0.5 MG/DOSE SOPN Inject into the skin.     traZODone (DESYREL) 50 MG tablet Take 50 mg by mouth at bedtime as needed for sleep.     valsartan (DIOVAN) 40 MG tablet TAKE 1 TABLET DAILY 90 tablet 3   vitamin C (ASCORBIC ACID) 500 MG tablet Take 1,000 mg by mouth daily.      No  current facility-administered medications on file prior to visit.     ALLERGIES: Allergies  Allergen Reactions   Lisinopril Cough    tachycardia   Nexium [Esomeprazole] Other (See Comments)    Headache   Niacin And Related Other (See Comments)    flushing    FAMILY HISTORY: Family History  Problem Relation Age of Onset   Stroke Father    Lung cancer Father 77        smoker   Stroke Brother    Diabetes Brother 90   Lung cancer Brother    Alcoholism Sister    Kidney disease Sister    Coronary artery disease Neg Hx     SOCIAL HISTORY: Social History   Socioeconomic History   Marital status: Divorced    Spouse name: Not on file   Number of children: 2   Years of education: MD   Highest education level: Not on file  Occupational History   Occupation: retired    Fish farm manager: RETIRED  Scientist, product/process development strain: Not on file   Food insecurity    Worry: Not on file    Inability: Not on file   Transportation needs    Medical: Not on file    Non-medical: Not on file  Tobacco Use   Smoking status: Never Smoker   Smokeless tobacco: Never Used   Tobacco comment: "used to smoke pipe when I was younger"  Substance and Sexual Activity   Alcohol use: Yes    Alcohol/week: 4.0 standard drinks    Types: 2 Glasses of wine, 2 Shots of liquor per week    Comment: occasional   Drug use: No   Sexual activity: Yes  Lifestyle   Physical activity    Days per week: Not on file    Minutes per session: Not on file   Stress: Not on file  Relationships   Social connections    Talks on phone: Not on file    Gets together: Not on file    Attends religious service: Not  on file    Active member of club or organization: Not on file    Attends meetings of clubs or organizations: Not on file    Relationship status: Not on file   Intimate partner violence    Fear of current or ex partner: Not on file    Emotionally abused: Not on file     Physically abused: Not on file    Forced sexual activity: Not on file  Other Topics Concern   Not on file  Social History Narrative   Caffeine: 1 cup coffee/day    Occupation: ophthalmologist, retired   Edu: MD   Activity: going to gym 3x/wk with trainer   Diet: some water, fruits/vegetables some, red meat a few times, fish 1x/wk   POA - [name] (Daughter)    REVIEW OF SYSTEMS: Constitutional: No fevers, chills, or sweats, no generalized fatigue, change in appetite Eyes: No visual changes, double vision, eye pain Ear, nose and throat: No hearing loss, ear pain, nasal congestion, sore throat Cardiovascular: No chest pain, palpitations Respiratory:  No shortness of breath at rest or with exertion, wheezes GastrointestinaI: No nausea, vomiting, diarrhea, abdominal pain, fecal incontinence Genitourinary:  No dysuria, urinary retention or frequency Musculoskeletal:  No neck pain, back pain Integumentary: No rash, pruritus, skin lesions Neurological: as above Psychiatric: No depression, insomnia, anxiety Endocrine: No palpitations, fatigue, diaphoresis, mood swings, change in appetite, change in weight, increased thirst Hematologic/Lymphatic:  No purpura, petechiae. Allergic/Immunologic: no itchy/runny eyes, nasal congestion, recent allergic reactions, rashes  PHYSICAL EXAM: Blood pressure (!) 142/72, pulse 74, weight 185 lb (83.9 kg), SpO2 99 %. General: No acute distress.  Patient appears well-groomed.  Head:  Normocephalic/atraumatic Eyes:  fundi examined but not visualized Neck: supple, no paraspinal tenderness, full range of motion Back: No paraspinal tenderness Heart: regular rate and rhythm Lungs: Clear to auscultation bilaterally. Vascular: No carotid bruits. Neurological Exam: Mental status: alert and oriented to person, place, and time, recent and remote memory intact, fund of knowledge intact, attention and concentration intact, speech fluent and not dysarthric,  language intact. Cranial nerves: CN I: not tested CN II: pupils equal, round and reactive to light, visual fields intact CN III, IV, VI:  full range of motion, no nystagmus, no ptosis CN V: facial sensation intact CN VII: upper and lower face symmetric CN VIII: hearing intact CN IX, X: gag intact, uvula midline CN XI: sternocleidomastoid and trapezius muscles intact CN XII: tongue midline Bulk & Tone: trace right upper extremity spasticity with arm flexion; no fasciculations. Motor:  4+/5 right upper and lower extremities except right foot drop.  Left side 5/5. Sensation:  Pinprick sensation mildly reduced in right upper extremity;  Otherwise pinprick and vibration sensation reduced in feet . Deep Tendon Reflexes:  1+ throughout,  toes downgoing. Finger to nose testing:  Without dysmetria.  Gait:  Ambulates with cane.  Mildly unsteady.  Able to turn but unable to tandem walk. Romberg with sway.  IMPRESSION: 1.  Right hemiparesis as late effect of stroke 2.  Unsteady gait, which I attribute to combination of right lower extremity weakness and diabetic polyneuropathy 3.  Diplopia, occurring later in the day.  Will evaluate for myasthenia gravis. 4.  Morning headaches.  May be tension-type but given the associated daytime drowsiness, may be related to his OSA  PLAN: 1.  Will check myasthenia panel 2.  Increase gabapentin to 600mg  twice daily to treat headache 3.  Advised to check if CPAP settings need to be changed.  4.  Optimize glycemic control 5.  Follow up in 4 months.  Thank you for allowing me to take part in the care of this patient.  Metta Clines, DO  CC:  James Bush, MD

## 2019-03-11 ENCOUNTER — Ambulatory Visit (INDEPENDENT_AMBULATORY_CARE_PROVIDER_SITE_OTHER): Payer: Medicare Other | Admitting: Neurology

## 2019-03-11 ENCOUNTER — Other Ambulatory Visit (INDEPENDENT_AMBULATORY_CARE_PROVIDER_SITE_OTHER): Payer: Medicare Other

## 2019-03-11 ENCOUNTER — Other Ambulatory Visit: Payer: Self-pay

## 2019-03-11 ENCOUNTER — Encounter: Payer: Self-pay | Admitting: Neurology

## 2019-03-11 ENCOUNTER — Ambulatory Visit: Payer: Medicare Other | Admitting: Physical Therapy

## 2019-03-11 VITALS — BP 142/72 | HR 74 | Wt 185.0 lb

## 2019-03-11 DIAGNOSIS — E1142 Type 2 diabetes mellitus with diabetic polyneuropathy: Secondary | ICD-10-CM

## 2019-03-11 DIAGNOSIS — R634 Abnormal weight loss: Secondary | ICD-10-CM | POA: Insufficient documentation

## 2019-03-11 DIAGNOSIS — M6281 Muscle weakness (generalized): Secondary | ICD-10-CM

## 2019-03-11 DIAGNOSIS — E039 Hypothyroidism, unspecified: Secondary | ICD-10-CM | POA: Insufficient documentation

## 2019-03-11 DIAGNOSIS — D500 Iron deficiency anemia secondary to blood loss (chronic): Secondary | ICD-10-CM | POA: Insufficient documentation

## 2019-03-11 DIAGNOSIS — I69351 Hemiplegia and hemiparesis following cerebral infarction affecting right dominant side: Secondary | ICD-10-CM

## 2019-03-11 DIAGNOSIS — R2689 Other abnormalities of gait and mobility: Secondary | ICD-10-CM

## 2019-03-11 DIAGNOSIS — R293 Abnormal posture: Secondary | ICD-10-CM | POA: Diagnosis not present

## 2019-03-11 DIAGNOSIS — R261 Paralytic gait: Secondary | ICD-10-CM | POA: Diagnosis not present

## 2019-03-11 DIAGNOSIS — R296 Repeated falls: Secondary | ICD-10-CM | POA: Diagnosis not present

## 2019-03-11 DIAGNOSIS — H532 Diplopia: Secondary | ICD-10-CM

## 2019-03-11 DIAGNOSIS — R2681 Unsteadiness on feet: Secondary | ICD-10-CM | POA: Diagnosis not present

## 2019-03-11 DIAGNOSIS — I129 Hypertensive chronic kidney disease with stage 1 through stage 4 chronic kidney disease, or unspecified chronic kidney disease: Secondary | ICD-10-CM | POA: Insufficient documentation

## 2019-03-11 DIAGNOSIS — Z86010 Personal history of colonic polyps: Secondary | ICD-10-CM | POA: Insufficient documentation

## 2019-03-11 DIAGNOSIS — I69320 Aphasia following cerebral infarction: Secondary | ICD-10-CM | POA: Insufficient documentation

## 2019-03-11 MED ORDER — GABAPENTIN 300 MG PO CAPS: 600.0000 mg | ORAL_CAPSULE | Freq: Two times a day (BID) | ORAL | 1 refills | Status: DC

## 2019-03-11 NOTE — Patient Instructions (Addendum)
1  Increase gabapentin to 600mg  twice daily 2.  Will check myasthenia panel 3.  Follow up in 6 months.   Your provider has requested that you have labwork completed today. Please go to [facility] ([id number]) on the second floor of this building before leaving the office today. You do not need to check in. If you are not called within 15 minutes please check with the front desk.

## 2019-03-12 NOTE — Therapy (Signed)
[facility] [address] [place], [name], [address] Phone: [phone]   Fax:  [phone]  Physical Therapy Treatment  Patient Details  Name: James JUNCAJ, MD MRN: [id number] Date of Birth: March 01, 1931 Referring Provider (PT): Charlett Blake MD  CLINIC OPERATION CHANGES: [facility] is open at lower capacity following universal masking, social distancing, and patient screening.  The patient's COVID risk of complications score is 7.  Encounter Date: 03/11/2019  PT End of Session - 03/12/19 2024    Visit Number  8    Number of Visits  29    Date for PT Re-Evaluation  05/16/19    Authorization Type  Medicare & Tricare    Authorization Time Period  [employer] will pick up 100% after Medicare    PT Start Time  1104    PT Stop Time  1147    PT Time Calculation (min)  43 min    Equipment Utilized During Treatment  Gait belt    Activity Tolerance  Patient tolerated treatment well   c/o increased Rt knee instability since fall sustained last week   Behavior During Therapy  WFL for tasks assessed/performed       Past Medical History:  Diagnosis Date  . Anemia   . Basal cell carcinoma 2018   R temple   . Basal cell carcinoma of nose 2002   bridge of nose  . BPH with obstruction/lower urinary tract symptoms    failed flomax, uroxatral, myrbetriq - sees urology [name]) pending videourodynamics  . CKD (chronic kidney disease) stage 3, GFR 30-59 ml/min (HCC) 03/11/2012   Renal US 02/2014 - multiple kidney cysts   . CVA (cerebral infarction) 03/07/12   slurred speech; right hemplegia, left handed - completed PT 07/2014 (17 sessions)  . Depression    mild, on cymbalta per prior PCP  . Erectile dysfunction    per urology (prostaglandin injection)  . GERD (gastroesophageal reflux disease)    with sliding HH  . H/O hiatal hernia   . History of kidney problems 12/2010   lacerated left kidney after fall   . HLD (hyperlipidemia)   . Hx of basal cell carcinoma 07/10/2017   R. lat. forehead near hair line  . Kidney cysts 02/2014   bilateral (renal US)  . Migraines 03/11/12   now rare  . OSA on CPAP    sleep study 01/2014 - severe, CPAP at 10, previously on nuvigil (Dr. [name])  . Palpitations 01/2012   holter WNL, rare PAC/PVCs  . Presbycusis of both ears 2016   rec amplification [facility])  . Restless leg syndrome    well controlled on mirapex  . Rosacea    [place]  . TIA (transient ischemic attack)   . Type 2 diabetes, controlled, with neuropathy [facility]) 2011   [name] in [facility]    Past Surgical History:  Procedure Laterality Date  . ABI  2007   WNL  . CARDIAC CATHETERIZATION  2004   normal; [place]  . CARDIOVASCULAR STRESS TEST  02/2014   WNL, low risk scan, EF 68%  . CAROTID ENDARTERECTOMY    . CATARACT EXTRACTION W/ INTRAOCULAR LENS IMPLANT  11//2012 - 08/2011  . COLONOSCOPY  03/2011   poor prep, unable to biopsy polyp in tic fold, rpt 3 mo [name])  . COLONOSCOPY  06/2011   mult polyps adenomatous, severe diverticulosis, rpt 3 yrs [name])  . CYSTOSCOPY WITH URETHRAL DILATATION N/A 07/24/2013   Procedure: CYSTOSCOPY WITH  URETHRAL DILATATION;  Surgeon: [name], MD;  Location: [facility];  Service: Urology;  Laterality: N/A;  . ESOPHAGOGASTRODUODENOSCOPY  2006   duodenitis, medual HH, Grade 2 reflux, mild gastritis  . ESOPHAGOGASTRODUODENOSCOPY  07/2011   mild chronic gastritis  . [place]  . [place]; 2002   left, then repaired  . MRI brain  04/2010   WNL  . PROSTATE SURGERY  02/2009   PVP (laser)  . SKIN CANCER EXCISION  2002   bridge of nose, [facility]  . TONSILLECTOMY AND ADENOIDECTOMY  ~ 1945  . TRANSURETHRAL INCISION OF PROSTATE N/A 07/24/2013   Procedure: TRANSURETHRAL INCISION OF THE PROSTATE (TUIP);  Surgeon: [name], MD;  Location: [facility];  Service: Urology;  Laterality: N/A;  . URETHRAL DILATION  2014    [place]    There were no vitals filed for this visit.  Subjective Assessment - 03/12/19 2020    Subjective  Pt states he is tired today - had appt with Dr. Tomi Likens this am but went to wrong building and had to walk alot in getting to the correct one    Pertinent History  left subcortical infarct on 03/11/2014. CVA, DM2, neuropathy, restless leg syndrome, HTN    Limitations  Lifting;Standing;Walking;House hold activities    Patient Stated Goals  improve strength of right leg. improve walking & balance without falls.     Currently in Pain?  No/denies                       [facility] Adult PT Treatment/Exercise - 03/12/19 0001      Transfers   Transfers  Sit to Stand    Stand to Sit  4: Min guard    Number of Reps  Other reps (comment)   5   Comments  1 UE support      Ambulation/Gait   Ambulation/Gait  Yes    Ambulation/Gait Assistance  4: Min guard    Ambulation Distance (Feet)  115 Feet    Assistive device  Straight cane   with rubber quad tip   Gait Pattern  Step-through pattern;Decreased arm swing - right;Decreased step length - left;Decreased stance time - right;Decreased stride length;Decreased weight shift to right;Narrow base of support    Ambulation Surface  Level;Indoor      Knee/Hip Exercises: Machines for Strengthening   Cybex Leg Press  bil. LE's 60# 10 reps:  RLE only 40# 2 sets 10 reps      Knee/Hip Exercises: Standing   Forward Step Up  Right;1 set;10 reps;Hand Hold: 2;Step Height: 6"    Step Down  Right;1 set;5 reps;Hand Hold: 2;Step Height: 6"   5 reps due to fatigue         Balance Exercises - 03/12/19 2023      Balance Exercises: Standing   Rockerboard  Anterior/posterior;Other reps (comment);UE support   3 sets 10 reps with bil. UE support inside // bars   Sidestepping  2 reps   inside // bars (20') with 1 UE support   Other Standing Exercises  stepping over and back of balance beam 10 reps with LLE for improved RLE weight bearing with  UE support;          PT Short Term Goals - 03/12/19 2028      PT SHORT TERM GOAL #1   Title  Patient demostrates understanding of updated HEP. (All STGs Target Date: 03/20/2019)    Time  1    Period  Months    Status  On-going    Target Date  03/20/19      PT SHORT TERM GOAL #2   Title  Patient ambulates 300' with cane with quad tip & AFO with supervision.     Time  1    Period  Months    Status  Revised    Target Date  03/20/19      PT SHORT TERM GOAL #3   Title  Patient negotiates ramps & curbs with cane with quad tip with supervision.     Time  1    Period  Months    Status  Revised    Target Date  03/20/19      PT SHORT TERM GOAL #4   Title  Timed Up & Go with cane with quad tip <17.5 seconds.    Time  1    Period  Months    Status  Revised    Target Date  03/20/19      PT SHORT TERM GOAL #5   Title  Patient verbalizes understanding of new AFO recommendations.     Time  1    Period  Months    Status  New    Target Date  03/20/19        PT Long Term Goals - 03/12/19 2028      PT LONG TERM GOAL #1   Title  Patient verbalizes & demonstrates understanding of ongoing HEP / fitness plan. (All LTGs Target Date: 05/16/2019)    Time  3    Period  Months    Status  On-going      PT LONG TERM GOAL #2   Title  Gait Velocity with cane with quad tip comfortable pace >/= 2.1 ft/sec and fast >/= 2.5 ft/sec    Time  3    Period  Months    Status  Revised      PT LONG TERM GOAL #3   Title  Timed Up & Go with cane with quad tip <13.5 seconds to indicate lower fall risk.     Time  3    Period  Months    Status  Revised      PT LONG TERM GOAL #4   Title  Dynamic Gait Index with Cane with quad tip >12/24 to indicate lower fall risk    Time  3    Period  Months    Status  Revised      PT LONG TERM GOAL #5   Title  Patient negotiates ramps & curbs with cane with quad tip modified independent.     Time  3    Period  Months    Status  Revised      PT LONG TERM  GOAL #6   Title  Cognitive Timed Up & Go with cane with quad tip <20sec & continues naming activity to indicate lower fall risk when distracted    Time  3    Period  Months    Status  New            Plan - 03/12/19 2025    Clinical Impression Statement  Pt's balance and gait not as steady today due to fatigue; pt had one occurence of LOB with use of quad tip SPC in which he used a crossover step with RLE to recover - min assist provided with recovery of LOB.    Personal Factors and Comorbidities  Age;Comorbidity  3+;Time since onset of injury/illness/exacerbation;Other   lives alone   Comorbidities  CVA, DM, neuropathy, HTN, restless leg syndrome    Examination-Activity Limitations  Lift;Locomotion Level;Stairs;Stand;Other   balance   Examination-Participation Restrictions  Community Activity    Stability/Clinical Decision Making  Evolving/Moderate complexity    Rehab Potential  Good    PT Frequency  2x / week    PT Duration  12 weeks    PT Treatment/Interventions  ADLs/Self Care Home Management;DME Instruction;Gait training;Stair training;Functional mobility training;Therapeutic activities;Therapeutic exercise;Balance training;Neuromuscular re-education;Patient/family education;Orthotic Fit/Training;Vestibular    PT Next Visit Plan  cont balance and gait training    Recommended Other Services  AFO order received on 03-11-19    Consulted and Agree with Plan of Care  Patient       Patient will benefit from skilled therapeutic intervention in order to improve the following deficits and impairments:  Abnormal gait, Decreased activity tolerance, Decreased balance, Decreased coordination, Decreased endurance, Decreased mobility, Decreased range of motion, Decreased strength, Impaired tone, Impaired flexibility, Postural dysfunction, Other (comment)(orthotic dependency)  Visit Diagnosis: 1. Other abnormalities of gait and mobility   2. Unsteadiness on feet   3. Muscle weakness  (generalized)        Problem List Patient Active Problem List   Diagnosis Date Noted  . Aphasia as late effect of stroke 03/11/2019  . Dyslipidemia 03/11/2019  . Hx of colonic polyps 03/11/2019  . Diabetic nephropathy ([place]) 03/11/2019  . Hypertensive renal disease 03/11/2019  . Hypothyroidism 03/11/2019  . Iron deficiency anemia due to chronic blood loss 03/11/2019  . Iron deficiency anemia 03/11/2019  . Polyneuropathy due to type 2 diabetes mellitus ([place]) 03/11/2019  . Weight loss 03/11/2019  . GERD without esophagitis 03/11/2019  . Acute respiratory infection 02/26/2019  . Type 2 diabetes mellitus with diabetic nephropathy ([place]) 11/29/2018  . Headache 10/30/2018  . Peripheral neuropathy 01/15/2018  . Left wrist pain 01/15/2018  . Hx of basal cell carcinoma 07/10/2017  . Rosacea   . Gait disturbance, post-stroke 10/05/2015  . Presbycusis of both ears   . Advanced care planning/counseling discussion 06/23/2015  . Impaired mobility 04/23/2015  . HTN (hypertension) 03/18/2015  . Chronic diastolic heart failure, NYHA class 1 ([place]) 03/18/2015  . Major depressive disorder, single episode 01/19/2015  . Benign prostatic hyperplasia with lower urinary tract symptoms 01/19/2015  . Familial multiple lipoprotein-type hyperlipidemia 01/19/2015  . Spastic hemiparesis affecting dominant side ([place]) 07/07/2014  . Spastic hemiplegia affecting dominant side ([place]) 07/07/2014  . Shoulder joint pain 03/16/2014  . Chest pressure 02/03/2014  . Other chest pain 02/03/2014  . Medicare annual wellness visit, subsequent 11/06/2013  . Erectile dysfunction due to arterial insufficiency 02/05/2013  . ED (erectile dysfunction) of organic origin 02/05/2013  . MDD (major depressive disorder), recurrent episode, moderate ([place])   . Palpitations 06/26/2012  . HLD (hyperlipidemia)   . Restless leg syndrome   . GERD (gastroesophageal reflux disease)   . BPH with obstruction/lower urinary tract symptoms    . History of CVA (cerebrovascular accident) 03/11/2012  . Hemiparesis affecting right side as late effect of stroke ([place]) 03/11/2012  . Type 2 diabetes, controlled, with neuropathy ([place]) 03/11/2012  . OSA on CPAP 03/11/2012  . CKD stage 3 secondary to diabetes ([place]) 03/11/2012  . Migraines 03/11/2012  . Cerebral artery occlusion with cerebral infarction ([place]) 03/11/2012  . Type 2 diabetes mellitus with diabetic neuropathy ([place]) 03/11/2012  . Hemiplegia ([place]) 03/11/2012    Tashara Suder, [place], PT 03/12/2019, 8:30 PM  [facility] [address] [place], [name], [address] Phone: [phone]   Fax:  [phone]  Name: ERINN HUSKINS, MD MRN: [id number] Date of Birth: 06/07/1931

## 2019-03-13 ENCOUNTER — Other Ambulatory Visit: Payer: Self-pay

## 2019-03-13 ENCOUNTER — Ambulatory Visit: Payer: Medicare Other | Admitting: Neurology

## 2019-03-13 ENCOUNTER — Ambulatory Visit: Payer: Medicare Other | Admitting: Physical Therapy

## 2019-03-13 DIAGNOSIS — R293 Abnormal posture: Secondary | ICD-10-CM | POA: Diagnosis not present

## 2019-03-13 DIAGNOSIS — M6281 Muscle weakness (generalized): Secondary | ICD-10-CM | POA: Diagnosis not present

## 2019-03-13 DIAGNOSIS — R2689 Other abnormalities of gait and mobility: Secondary | ICD-10-CM | POA: Diagnosis not present

## 2019-03-13 DIAGNOSIS — R296 Repeated falls: Secondary | ICD-10-CM | POA: Diagnosis not present

## 2019-03-13 DIAGNOSIS — R2681 Unsteadiness on feet: Secondary | ICD-10-CM | POA: Diagnosis not present

## 2019-03-13 DIAGNOSIS — R261 Paralytic gait: Secondary | ICD-10-CM | POA: Diagnosis not present

## 2019-03-13 NOTE — Patient Instructions (Addendum)
Functional Quadriceps: Sit to Stand    Sit on edge of chair, feet flat on floor. Stand upright, extending knees fully. Repeat _10___ times per set. Do _1___ sets per session. Do _1___ sessions per day.  ALSO gave pics of forward, back and side kicks, marching  sidestepping and SLS for HEP  Stepping strategy -up, back and side with LLE for imrpoved RLE SLS

## 2019-03-14 NOTE — Therapy (Signed)
[facility] [address] [place], [name], [address] Phone: [phone]   Fax:  [phone]  Physical Therapy Treatment  Patient Details  Name: James WILMER, James Bell MRN: [id number] Date of Birth: 20-Aug-1931 Referring Provider (PT): Charlett Blake James Bell  CLINIC OPERATION CHANGES: [facility] is open at lower capacity following universal masking, social distancing, and patient screening.  The patient's COVID risk of  complications score is 7.  Encounter Date: 03/13/2019  PT End of Session - 03/14/19 1622    Visit Number  9    Number of Visits  29    Date for PT Re-Evaluation  05/16/19    Authorization Type  Medicare & Tricare    Authorization Time Period  [employer] will pick up 100% after Medicare    PT Start Time  1302    PT Stop Time  1350    PT Time Calculation (min)  48 min    Equipment Utilized During Treatment  Gait belt    Activity Tolerance  Patient tolerated treatment well   c/o increased Rt knee instability since fall sustained last week   Behavior During Therapy  WFL for tasks assessed/performed       Past Medical History:  Diagnosis Date  . Anemia   . Basal cell carcinoma 2018   R temple   . Basal cell carcinoma of nose 2002   bridge of nose  . BPH with obstruction/lower urinary tract symptoms    failed flomax, uroxatral, myrbetriq - sees urology [name]) pending videourodynamics  . CKD (chronic kidney disease) stage 3, GFR 30-59 ml/min (HCC) 03/11/2012   Renal US 02/2014 - multiple kidney cysts   . CVA (cerebral infarction) 03/07/12   slurred speech; right hemplegia, left handed - completed PT 07/2014 (17 sessions)  . Depression    mild, on cymbalta per prior PCP  . Erectile dysfunction    per urology (prostaglandin injection)  . GERD (gastroesophageal reflux disease)    with sliding HH  . H/O hiatal hernia   . History of kidney problems 12/2010   lacerated left kidney after  fall  . HLD (hyperlipidemia)   . Hx of basal cell carcinoma 07/10/2017   R. lat. forehead near hair line  . Kidney cysts 02/2014   bilateral (renal US)  . Migraines 03/11/12   now rare  . OSA on CPAP    sleep study 01/2014 - severe, CPAP at 10, previously on nuvigil (Dr. [name])  . Palpitations 01/2012   holter WNL, rare PAC/PVCs  . Presbycusis of both ears 2016   rec amplification [facility])  . Restless leg syndrome    well controlled on mirapex  . Rosacea    [place]  . TIA (transient ischemic attack)   . Type 2 diabetes, controlled, with neuropathy [facility]) 2011   [name] [facility]    Past Surgical History:  Procedure Laterality Date  . ABI  2007   WNL  . CARDIAC CATHETERIZATION  2004   normal; [place]  . CARDIOVASCULAR STRESS TEST  02/2014   WNL, low risk scan, EF 68%  . CAROTID ENDARTERECTOMY    . CATARACT EXTRACTION W/ INTRAOCULAR LENS IMPLANT  11//2012 - 08/2011  . COLONOSCOPY  03/2011   poor prep, unable to biopsy polyp in tic fold, rpt 3 mo [name])  . COLONOSCOPY  06/2011   mult polyps adenomatous, severe diverticulosis, rpt 3 yrs [name])  . CYSTOSCOPY WITH URETHRAL DILATATION N/A 07/24/2013   Procedure: CYSTOSCOPY  WITH URETHRAL DILATATION;  Surgeon: [name], James Bell;  Location: [facility];  Service: Urology;  Laterality: N/A;  . ESOPHAGOGASTRODUODENOSCOPY  2006   duodenitis, medual HH, Grade 2 reflux, mild gastritis  . ESOPHAGOGASTRODUODENOSCOPY  07/2011   mild chronic gastritis  . [place]  . [place]; 2002   left, then repaired  . MRI brain  04/2010   WNL  . PROSTATE SURGERY  02/2009   PVP (laser)  . SKIN CANCER EXCISION  2002   bridge of nose, BCC  . TONSILLECTOMY AND ADENOIDECTOMY  ~ 1945  . TRANSURETHRAL INCISION OF PROSTATE N/A 07/24/2013   Procedure: TRANSURETHRAL INCISION OF THE PROSTATE (TUIP);  Surgeon: [name], James Bell;  Location: [facility];  Service: Urology;  Laterality: N/A;  . URETHRAL DILATION  2014    tannenbaum    There were no vitals filed for this visit.  Subjective Assessment - 03/13/19 1304    Subjective  Pt states he feels he is getting stronger    Pertinent History  left subcortical infarct on 03/11/2014. CVA, DM2, neuropathy, restless leg syndrome, HTN    Limitations  Lifting;Standing;Walking;House hold activities    Patient Stated Goals  improve strength of right leg. improve walking & balance without falls.     Currently in Pain?  No/denies                       [facility] Adult PT Treatment/Exercise - 03/14/19 0001      Transfers   Transfers  Sit to Stand    Stand to Sit  4: Min guard    Number of Reps  Other reps (comment)   5   Comments  no UE support      Ambulation/Gait   Ambulation/Gait  Yes    Ambulation/Gait Assistance  4: Min guard    Ambulation Distance (Feet)  230 Feet    Assistive device  Straight cane   with rubber quad tip   Gait Pattern  Step-through pattern;Decreased arm swing - right;Decreased step length - left;Decreased stance time - right;Decreased stride length;Decreased weight shift to right;Narrow base of support    Ambulation Surface  Level;Indoor    Stairs  Yes    Stairs Assistance  5: Supervision    Stair Management Technique  Two rails;Alternating pattern;Step to pattern;Forwards   alternating ascending; step to with descension   Number of Stairs  4    Height of Stairs  6      Knee/Hip Exercises: Machines for Strengthening   Cybex Leg Press  bil. LE's 60# 15 reps:  RLE only 40# 2 sets 10 reps      Knee/Hip Exercises: Standing   Forward Step Up  Right;1 set;10 reps;Hand Hold: 2;Step Height: 6"    Step Down  Right;1 set;10 reps;Hand Hold: 2;Step Height: 6"          Balance Exercises - 03/14/19 1620      Balance Exercises: Standing   Rockerboard  Anterior/posterior;Other reps (comment);UE support   3 sets 10 reps with bil. UE support inside // bars   Sidestepping  2 reps   along counter with UE support prn    Other Standing Exercises  stepping over and back of balance beam 10 reps with LLE for improved RLE weight bearing with UE support;      Pt instructed in balance HEP - forward, back and side kicks 10 reps each leg;  Marching in place; SLS:  And stepping strategy with LLE for improved SLS on RLE - gave these pics for HEP   Agility ladder on floor - pt performed stepping inside rungs of ladder step over step sequence with use of SPC with quad tip with CGA on 1st rep; then progressed to hand held assist only for 2 reps without use of cane (mod hand held assist) with cues to lift Rt foot   PT Education - 03/14/19 1633    Education Details  instructed in balance HEP - kicks, SLS, marching and sidestepping    Person(s) Educated  Patient    Methods  Explanation;Demonstration;Handout    Comprehension  Verbalized understanding;Returned demonstration       PT Short Term Goals - 03/14/19 1627      PT SHORT TERM GOAL #1   Title  Patient demostrates understanding of updated HEP. (All STGs Target Date: 03/20/2019)    Time  1    Period  Months    Status  On-going    Target Date  03/20/19      PT SHORT TERM GOAL #2   Title  Patient ambulates 300' with cane with quad tip & AFO with supervision.     Time  1    Period  Months    Status  Revised    Target Date  03/20/19      PT SHORT TERM GOAL #3   Title  Patient negotiates ramps & curbs with cane with quad tip with supervision.     Time  1    Period  Months    Status  Revised    Target Date  03/20/19      PT SHORT TERM GOAL #4   Title  Timed Up & Go with cane with quad tip <17.5 seconds.    Time  1    Period  Months    Status  Revised    Target Date  03/20/19      PT SHORT TERM GOAL #5   Title  Patient verbalizes understanding of new AFO recommendations.     Time  1    Period  Months    Status  New    Target Date  03/20/19        PT Long Term Goals - 03/14/19 1627      PT LONG TERM GOAL #1   Title  Patient verbalizes &  demonstrates understanding of ongoing HEP / fitness plan. (All LTGs Target Date: 05/16/2019)    Time  3    Period  Months    Status  On-going      PT LONG TERM GOAL #2   Title  Gait Velocity with cane with quad tip comfortable pace >/= 2.1 ft/sec and fast >/= 2.5 ft/sec    Time  3    Period  Months    Status  Revised      PT LONG TERM GOAL #3   Title  Timed Up & Go with cane with quad tip <13.5 seconds to indicate lower fall risk.     Time  3    Period  Months    Status  Revised      PT LONG TERM GOAL #4   Title  Dynamic Gait Index with Cane with quad tip >12/24 to indicate lower fall risk    Time  3    Period  Months    Status  Revised      PT LONG TERM GOAL #5   Title  Patient  negotiates ramps & curbs with cane with quad tip modified independent.     Time  3    Period  Months    Status  Revised      PT LONG TERM GOAL #6   Title  Cognitive Timed Up & Go with cane with quad tip <20sec & continues naming activity to indicate lower fall risk when distracted    Time  3    Period  Months    Status  New            Plan - 03/14/19 1623    Clinical Impression Statement  Pt's gait and balance improved today compared to performance on 03-11-19 due to pt not as fatigued during today's session; pt is progressing towards goals - pt continues to have occasional unsteadiness with LOB with use of quad based SPC.    Personal Factors and Comorbidities  Age;Comorbidity 3+;Time since onset of injury/illness/exacerbation;Other   lives alone   Comorbidities  CVA, DM, neuropathy, HTN, restless leg syndrome    Examination-Activity Limitations  Lift;Locomotion Level;Stairs;Stand;Other   balance   Examination-Participation Restrictions  Community Activity    Stability/Clinical Decision Making  Evolving/Moderate complexity    Rehab Potential  Good    PT Frequency  2x / week    PT Duration  12 weeks    PT Treatment/Interventions  ADLs/Self Care Home Management;DME Instruction;Gait  training;Stair training;Functional mobility training;Therapeutic activities;Therapeutic exercise;Balance training;Neuromuscular re-education;Patient/family education;Orthotic Fit/Training;Vestibular    PT Next Visit Plan  10th visit progress note due next session: begin checking STG's and establish new STG's if pt continues for another 8 weeks:    Recommended Other Services  script for AFO faxed to [facility] on 03-13-19    Consulted and Agree with Plan of Care  Patient       Patient will benefit from skilled therapeutic intervention in order to improve the following deficits and impairments:  Abnormal gait, Decreased activity tolerance, Decreased balance, Decreased coordination, Decreased endurance, Decreased mobility, Decreased range of motion, Decreased strength, Impaired tone, Impaired flexibility, Postural dysfunction, Other (comment)(orthotic dependency)  Visit Diagnosis: 1. Other abnormalities of gait and mobility   2. Unsteadiness on feet   3. Muscle weakness (generalized)        Problem List Patient Active Problem List   Diagnosis Date Noted  . Aphasia as late effect of stroke 03/11/2019  . Dyslipidemia 03/11/2019  . Hx of colonic polyps 03/11/2019  . Diabetic nephropathy ([place]) 03/11/2019  . Hypertensive renal disease 03/11/2019  . Hypothyroidism 03/11/2019  . Iron deficiency anemia due to chronic blood loss 03/11/2019  . Iron deficiency anemia 03/11/2019  . Polyneuropathy due to type 2 diabetes mellitus ([place]) 03/11/2019  . Weight loss 03/11/2019  . GERD without esophagitis 03/11/2019  . Acute respiratory infection 02/26/2019  . Type 2 diabetes mellitus with diabetic nephropathy ([place]) 11/29/2018  . Headache 10/30/2018  . Peripheral neuropathy 01/15/2018  . Left wrist pain 01/15/2018  . Hx of basal cell carcinoma 07/10/2017  . Rosacea   . Gait disturbance, post-stroke 10/05/2015  . Presbycusis of both ears   . Advanced care planning/counseling discussion 06/23/2015   . Impaired mobility 04/23/2015  . HTN (hypertension) 03/18/2015  . Chronic diastolic heart failure, NYHA class 1 ([place]) 03/18/2015  . Major depressive disorder, single episode 01/19/2015  . Benign prostatic hyperplasia with lower urinary tract symptoms 01/19/2015  . Familial multiple lipoprotein-type hyperlipidemia 01/19/2015  . Spastic hemiparesis affecting dominant side ([place]) 07/07/2014  . Spastic hemiplegia affecting dominant side ([place])  07/07/2014  . Shoulder joint pain 03/16/2014  . Chest pressure 02/03/2014  . Other chest pain 02/03/2014  . Medicare annual wellness visit, subsequent 11/06/2013  . Erectile dysfunction due to arterial insufficiency 02/05/2013  . ED (erectile dysfunction) of organic origin 02/05/2013  . MDD (major depressive disorder), recurrent episode, moderate ([place])   . Palpitations 06/26/2012  . HLD (hyperlipidemia)   . Restless leg syndrome   . GERD (gastroesophageal reflux disease)   . BPH with obstruction/lower urinary tract symptoms   . History of CVA (cerebrovascular accident) 03/11/2012  . Hemiparesis affecting right side as late effect of stroke ([place]) 03/11/2012  . Type 2 diabetes, controlled, with neuropathy ([place]) 03/11/2012  . OSA on CPAP 03/11/2012  . CKD stage 3 secondary to diabetes ([place]) 03/11/2012  . Migraines 03/11/2012  . Cerebral artery occlusion with cerebral infarction ([place]) 03/11/2012  . Type 2 diabetes mellitus with diabetic neuropathy ([place]) 03/11/2012  . Hemiplegia ([place]) 03/11/2012    Nicolus Ose, [place], PT 03/14/2019, 4:35 PM  [facility] [address] [place], [name], [address] Phone: [phone]   Fax:  [phone]  Name: James RADIGAN, James Bell MRN: [id number] Date of Birth: 01-16-1931

## 2019-03-15 LAB — STRIATED MUSCLE ANTIBODY: STRIATED MUSCLE AB SCREEN: NEGATIVE

## 2019-03-15 LAB — ACETYLCHOLINE RECEPTOR, BINDING: A CHR BINDING ABS: <0.3 nmol/L

## 2019-03-18 ENCOUNTER — Other Ambulatory Visit: Payer: Self-pay

## 2019-03-18 ENCOUNTER — Ambulatory Visit: Payer: Medicare Other | Admitting: Physical Therapy

## 2019-03-18 ENCOUNTER — Encounter: Payer: Self-pay | Admitting: Physical Therapy

## 2019-03-18 DIAGNOSIS — R2681 Unsteadiness on feet: Secondary | ICD-10-CM | POA: Diagnosis not present

## 2019-03-18 DIAGNOSIS — M6281 Muscle weakness (generalized): Secondary | ICD-10-CM | POA: Diagnosis not present

## 2019-03-18 DIAGNOSIS — R261 Paralytic gait: Secondary | ICD-10-CM | POA: Diagnosis not present

## 2019-03-18 DIAGNOSIS — R2689 Other abnormalities of gait and mobility: Secondary | ICD-10-CM

## 2019-03-18 DIAGNOSIS — R293 Abnormal posture: Secondary | ICD-10-CM

## 2019-03-18 DIAGNOSIS — R296 Repeated falls: Secondary | ICD-10-CM

## 2019-03-18 NOTE — Therapy (Signed)
[facility] [address] [place], [name], [address] Phone: [phone]   Fax:  [phone]  Physical Therapy Treatment & 10th Visit Progress Note   Patient Details  Name: James DUNAVANT, MD MRN: [id number] Date of Birth: January 03, 1931 Referring Provider (PT): Charlett Blake MD   Encounter Date: 03/18/2019   CLINIC OPERATION CHANGES: Outpatient [facility] is open at lower capacity following universal masking, social distancing, and patient screening.  The patient's COVID risk of complications score is 7.  Progress Note Reporting Period 11/19/2018 to 03/18/2019  See note below for Objective Data and Assessment of Progress/Goals.       PT End of Session - 03/18/19 0954    Visit Number  10    Number of Visits  29    Date for PT Re-Evaluation  05/16/19    Authorization Type  Medicare & Tricare    Authorization Time Period  [employer] will pick up 100% after Medicare    PT Start Time  0900    PT Stop Time  0946    PT Time Calculation (min)  46 min    Equipment Utilized During Treatment  Gait belt    Activity Tolerance  Patient tolerated treatment well   c/o increased Rt knee instability since fall sustained last week   Behavior During Therapy  WFL for tasks assessed/performed       Past Medical History:  Diagnosis Date  . Anemia   . Basal cell carcinoma 2018   R temple   . Basal cell carcinoma of nose 2002   bridge of nose  . BPH with obstruction/lower urinary tract symptoms    failed flomax, uroxatral, myrbetriq - sees urology [name]) pending videourodynamics  . CKD (chronic kidney disease) stage 3, GFR 30-59 ml/min (HCC) 03/11/2012   Renal US 02/2014 - multiple kidney cysts   . CVA (cerebral infarction) 03/07/12   slurred speech; right hemplegia, left handed - completed PT 07/2014 (17 sessions)  . Depression    mild, on cymbalta per prior PCP  . Erectile dysfunction    per urology (prostaglandin  injection)  . GERD (gastroesophageal reflux disease)    with sliding HH  . H/O hiatal hernia   . History of kidney problems 12/2010   lacerated left kidney after fall  . HLD (hyperlipidemia)   . Hx of basal cell carcinoma 07/10/2017   R. lat. forehead near hair line  . Kidney cysts 02/2014   bilateral (renal US)  . Migraines 03/11/12   now rare  . OSA on CPAP    sleep study 01/2014 - severe, CPAP at 10, previously on nuvigil (Dr. [name])  . Palpitations 01/2012   holter WNL, rare PAC/PVCs  . Presbycusis of both ears 2016   rec amplification [facility])  . Restless leg syndrome    well controlled on mirapex  . Rosacea    [place]  . TIA (transient ischemic attack)   . Type 2 diabetes, controlled, with neuropathy [facility]) 2011   [name] in [facility]    Past Surgical History:  Procedure Laterality Date  . ABI  2007   WNL  . CARDIAC CATHETERIZATION  2004   normal; [place]  . CARDIOVASCULAR STRESS TEST  02/2014   WNL, low risk scan, EF 68%  . CAROTID ENDARTERECTOMY    . CATARACT EXTRACTION W/ INTRAOCULAR LENS IMPLANT  11//2012 - 08/2011  . COLONOSCOPY  03/2011   poor prep, unable to biopsy polyp in tic fold, rpt  3 mo [name])  . COLONOSCOPY  06/2011   mult polyps adenomatous, severe diverticulosis, rpt 3 yrs [name])  . CYSTOSCOPY WITH URETHRAL DILATATION N/A 07/24/2013   Procedure: CYSTOSCOPY WITH URETHRAL DILATATION;  Surgeon: [name], MD;  Location: [facility];  Service: Urology;  Laterality: N/A;  . ESOPHAGOGASTRODUODENOSCOPY  2006   duodenitis, medual HH, Grade 2 reflux, mild gastritis  . ESOPHAGOGASTRODUODENOSCOPY  07/2011   mild chronic gastritis  . [place]; 2002   left, then repaired  . MRI brain  04/2010   WNL  . PROSTATE SURGERY  02/2009   PVP (laser)  . SKIN CANCER EXCISION  2002   bridge of nose, BCC  . TONSILLECTOMY AND ADENOIDECTOMY  ~ 1945  . TRANSURETHRAL INCISION OF PROSTATE N/A 07/24/2013   Procedure:  TRANSURETHRAL INCISION OF THE PROSTATE (TUIP);  Surgeon: [name], MD;  Location: [facility];  Service: Urology;  Laterality: N/A;  . URETHRAL DILATION  2014   tannenbaum    There were no vitals filed for this visit.  Subjective Assessment - 03/18/19 0851    Subjective  No falls. He has been doing the exercises. He is still walking 1/3 mile with neighbor which takes ~20 minutes. He built a small ramp at door threshold which has helped.    Pertinent History  left subcortical infarct on 03/11/2014. CVA, DM2, neuropathy, restless leg syndrome, HTN    Limitations  Lifting;Standing;Walking;House hold activities    Patient Stated Goals  improve strength of right leg. improve walking & balance without falls.     Currently in Pain?  No/denies         [facility] PT Assessment - 03/18/19 0900      Timed Up and Go Test   Normal TUG (seconds)  17.48   straight cane quad tip                  [facility] Adult PT Treatment/Exercise - 03/18/19 0900      Transfers   Transfers  Sit to Stand    Sit to Stand  5: Supervision;With upper extremity assist;From chair/3-in-1    Stand to Sit  5: Supervision;With upper extremity assist;To chair/3-in-1    Comments  --      Ambulation/Gait   Ambulation/Gait  Yes    Ambulation/Gait Assistance  5: Supervision;4: Min guard   Min guard with scanning   Ambulation/Gait Assistance Details  tactile & verbal cues on upright posture, maintaining path & pace with scanning    Ambulation Distance (Feet)  300 Feet    Assistive device  Straight cane;Other (Comment)   with rubber quad tip, AFO   Gait Pattern  Step-through pattern;Decreased arm swing - right;Decreased step length - left;Decreased stance time - right;Decreased stride length;Decreased weight shift to right;Narrow base of support    Ambulation Surface  Indoor;Level    Stairs  --    Stairs Assistance  --    Stair Management Technique  --    Number of Stairs  --    Height of Stairs  --    Ramp   5: Supervision   straight cane quad tip & AFO   Ramp Details (indicate cue type and reason)  tactile & verbal cues on posture & wt shift    Curb  4: Min assist   straight cane quad tip & AFO   Curb Details (indicate cue type and reason)  MinA for balance loss, demo & verbal  cues on stepping 2nd LE not even with 1st to improve balance & wt shift    Gait Comments  simulated stepping up/down threshold with aerobic step: initially in parallel bars with 2nd step thru to facilitate step thru, progressed to outside parallel bars with cane quad tip (pt reports can feel improved stabilization) and added small rolled pillow case to simulate runner under door (PT demo & instructed to step LLE on runner & RLE past, not on runner) pt performed with improved stabilization.  All simulations 4 reps.       Timed Up and Go Test   Normal TUG (seconds)  17.48   straight cane quad tip     High Level Balance   High Level Balance Activities  Head turns;Negotitating around obstacles   straight cane quad tip & AFO   High Level Balance Comments  tactile & verbal cues on maintaining path & pace with scanning (right/left, up/down & diagonals).  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[employer]  --      Knee/Hip Exercises: Standing   Forward Step Up  Right;1 set;10 reps;Hand Hold: 1;Step Height: 4"   parallel bar support   Step Down  Right;1 set;10 reps;Step Height: 4";Hand Hold: 1   parallel bar support         Balance Exercises - 03/18/19 0900      Balance Exercises: Standing   Standing Eyes Opened  Wide (BOA);Head turns   standing crossways firm beam head turns rt/lt, up/down diago   Stepping Strategy  Anterior;Posterior;Lateral;5 reps   stepping off beam & stabilizing w/minA & cues         PT Short Term Goals - 03/18/19 4665      PT SHORT TERM GOAL #1   Title  Patient demostrates understanding of updated HEP. (All STGs  Target Date: 03/20/2019)    Baseline  MET 03/14/2019    Time  1    Period  Months    Status  Achieved    Target Date  03/20/19      PT SHORT TERM GOAL #2   Title  Patient ambulates 300' with cane with quad tip & AFO with supervision.     Baseline  MET 03/18/2019    Time  1    Period  Months    Status  Achieved    Target Date  03/20/19      PT SHORT TERM GOAL #3   Title  Patient negotiates ramps & curbs with cane with quad tip with supervision.     Baseline  Partially MET 03/18/2019  ramp with supervision, curb with minA for balance    Time  1    Period  Months    Status  Partially Met    Target Date  03/20/19      PT SHORT TERM GOAL #4   Title  Timed Up & Go with cane with quad tip <17.5 seconds.    Baseline  MET 03/18/2019  TUG 17.48sec    Time  1    Period  Months    Status  Achieved    Target Date  03/20/19      PT SHORT TERM GOAL #5   Title  Patient verbalizes understanding of new AFO recommendations.     Baseline  MET 03/18/2019    Time  1    Period  Months    Status  Achieved  Target Date  03/20/19        PT Short Term Goals - 03/18/19 2225      PT SHORT TERM GOAL #1   Title  Patient demostrates understanding of updated HEP. (All STGs Target Date: 04/18/2019)    Time  1    Period  Months    Status  On-going    Target Date  04/18/19      PT SHORT TERM GOAL #2   Title  Patient ambulates 300' outdoors on paved surfaces with straight cane with quad tip & AFO with supervision.    Time  1    Period  Months    Status  Revised    Target Date  04/18/19      PT SHORT TERM GOAL #3   Title  Patient negotiates outdoor ramps & curbs with cane with quad tip with supervision.    Time  1    Period  Months    Status  Revised    Target Date  04/18/19      PT SHORT TERM GOAL #4   Title  Timed Up & Go with cane with quad tip <15.5 seconds.    Time  1    Period  Months    Status  Revised    Target Date  04/18/19      PT SHORT TERM GOAL #5   Title  Patient able to  maintain path & balance with scanning environment ambulating with cane quad tip with supervision.    Time  1    Period  Months    Status  New    Target Date  04/18/19        PT Long Term Goals - 03/14/19 1627      PT LONG TERM GOAL #1   Title  Patient verbalizes & demonstrates understanding of ongoing HEP / fitness plan. (All LTGs Target Date: 05/16/2019)    Time  3    Period  Months    Status  On-going      PT LONG TERM GOAL #2   Title  Gait Velocity with cane with quad tip comfortable pace >/= 2.1 ft/sec and fast >/= 2.5 ft/sec    Time  3    Period  Months    Status  Revised      PT LONG TERM GOAL #3   Title  Timed Up & Go with cane with quad tip <13.5 seconds to indicate lower fall risk.     Time  3    Period  Months    Status  Revised      PT LONG TERM GOAL #4   Title  Dynamic Gait Index with Cane with quad tip >12/24 to indicate lower fall risk    Time  3    Period  Months    Status  Revised      PT LONG TERM GOAL #5   Title  Patient negotiates ramps & curbs with cane with quad tip modified independent.     Time  3    Period  Months    Status  Revised      PT LONG TERM GOAL #6   Title  Cognitive Timed Up & Go with cane with quad tip <20sec & continues naming activity to indicate lower fall risk when distracted    Time  3    Period  Months    Status  New            Plan -  03/18/19 2217    Clinical Impression Statement  Patient met or partially met STGs. PT worked on ability to scan while ambulating maintaining balance. PT also worked on hip & step strategies standing crossways on firm 2X4 beam. Pt improved technique to negotiate step up with simulated threshold with skilled instruction.    Personal Factors and Comorbidities  Age;Comorbidity 3+;Time since onset of injury/illness/exacerbation;Other   lives alone   Comorbidities  CVA, DM, neuropathy, HTN, restless leg syndrome    Examination-Activity Limitations  Lift;Locomotion Level;Stairs;Stand;Other    balance   Examination-Participation Restrictions  Community Activity    Stability/Clinical Decision Making  Evolving/Moderate complexity    Rehab Potential  Good    PT Frequency  2x / week    PT Duration  12 weeks    PT Treatment/Interventions  ADLs/Self Care Home Management;DME Instruction;Gait training;Stair training;Functional mobility training;Therapeutic activities;Therapeutic exercise;Balance training;Neuromuscular re-education;Patient/family education;Orthotic Fit/Training;Vestibular    PT Next Visit Plan  Work towards updated STGs, work on gait with straight cane quad tip including scanning & curb/threshold negotiation    Consulted and Agree with Plan of Care  Patient       Patient will benefit from skilled therapeutic intervention in order to improve the following deficits and impairments:  Abnormal gait, Decreased activity tolerance, Decreased balance, Decreased coordination, Decreased endurance, Decreased mobility, Decreased range of motion, Decreased strength, Impaired tone, Impaired flexibility, Postural dysfunction, Other (comment)(orthotic dependency)  Visit Diagnosis: 1. Other abnormalities of gait and mobility   2. Unsteadiness on feet   3. Muscle weakness (generalized)   4. Abnormal posture   5. Repeated falls   6. Unsteadiness        Problem List Patient Active Problem List   Diagnosis Date Noted  . Aphasia as late effect of stroke 03/11/2019  . Dyslipidemia 03/11/2019  . Hx of colonic polyps 03/11/2019  . Diabetic nephropathy ([place]) 03/11/2019  . Hypertensive renal disease 03/11/2019  . Hypothyroidism 03/11/2019  . Iron deficiency anemia due to chronic blood loss 03/11/2019  . Iron deficiency anemia 03/11/2019  . Polyneuropathy due to type 2 diabetes mellitus ([place]) 03/11/2019  . Weight loss 03/11/2019  . GERD without esophagitis 03/11/2019  . Acute respiratory infection 02/26/2019  . Type 2 diabetes mellitus with diabetic nephropathy ([place]) 11/29/2018  .  Headache 10/30/2018  . Peripheral neuropathy 01/15/2018  . Left wrist pain 01/15/2018  . Hx of basal cell carcinoma 07/10/2017  . Rosacea   . Gait disturbance, post-stroke 10/05/2015  . Presbycusis of both ears   . Advanced care planning/counseling discussion 06/23/2015  . Impaired mobility 04/23/2015  . HTN (hypertension) 03/18/2015  . Chronic diastolic heart failure, NYHA class 1 ([place]) 03/18/2015  . Major depressive disorder, single episode 01/19/2015  . Benign prostatic hyperplasia with lower urinary tract symptoms 01/19/2015  . Familial multiple lipoprotein-type hyperlipidemia 01/19/2015  . Spastic hemiparesis affecting dominant side ([place]) 07/07/2014  . Spastic hemiplegia affecting dominant side ([place]) 07/07/2014  . Shoulder joint pain 03/16/2014  . Chest pressure 02/03/2014  . Other chest pain 02/03/2014  . Medicare annual wellness visit, subsequent 11/06/2013  . Erectile dysfunction due to arterial insufficiency 02/05/2013  . ED (erectile dysfunction) of organic origin 02/05/2013  . MDD (major depressive disorder), recurrent episode, moderate ([place])   . Palpitations 06/26/2012  . HLD (hyperlipidemia)   . Restless leg syndrome   . GERD (gastroesophageal reflux disease)   . BPH with obstruction/lower urinary tract symptoms   . History of CVA (cerebrovascular accident) 03/11/2012  . Hemiparesis affecting right side  as late effect of stroke ([place]) 03/11/2012  . Type 2 diabetes, controlled, with neuropathy ([place]) 03/11/2012  . OSA on CPAP 03/11/2012  . CKD stage 3 secondary to diabetes ([place]) 03/11/2012  . Migraines 03/11/2012  . Cerebral artery occlusion with cerebral infarction ([place]) 03/11/2012  . Type 2 diabetes mellitus with diabetic neuropathy ([place]) 03/11/2012  . Hemiplegia ([place]) 03/11/2012    Karizma Cheek PT, DPT 03/18/2019, 10:22 PM  [facility] [address] [place], [name], [address] Phone: [phone]    Fax:  [phone]  Name: MOSTYN VARNELL, MD MRN: [id number] Date of Birth: [birth year]

## 2019-03-20 ENCOUNTER — Telehealth: Payer: Self-pay

## 2019-03-20 ENCOUNTER — Ambulatory Visit: Payer: Medicare Other | Attending: Physical Medicine & Rehabilitation | Admitting: Physical Therapy

## 2019-03-20 ENCOUNTER — Other Ambulatory Visit: Payer: Self-pay

## 2019-03-20 VITALS — BP 139/65 | HR 73

## 2019-03-20 DIAGNOSIS — M6281 Muscle weakness (generalized): Secondary | ICD-10-CM | POA: Diagnosis not present

## 2019-03-20 DIAGNOSIS — R2681 Unsteadiness on feet: Secondary | ICD-10-CM | POA: Diagnosis not present

## 2019-03-20 DIAGNOSIS — R2689 Other abnormalities of gait and mobility: Secondary | ICD-10-CM | POA: Diagnosis not present

## 2019-03-20 DIAGNOSIS — R261 Paralytic gait: Secondary | ICD-10-CM | POA: Diagnosis not present

## 2019-03-20 DIAGNOSIS — R296 Repeated falls: Secondary | ICD-10-CM | POA: Diagnosis not present

## 2019-03-20 DIAGNOSIS — R293 Abnormal posture: Secondary | ICD-10-CM | POA: Insufficient documentation

## 2019-03-20 NOTE — Telephone Encounter (Signed)
Pt called given lab results  Myasthenia gravis antibodies are negative, pt verbalized understanding

## 2019-03-21 NOTE — Therapy (Signed)
[facility] [address] [place], [name], [address] Phone: [phone]   Fax:  [phone]  Physical Therapy Treatment  Patient Details  Name: James WILLCOX, MD MRN: [id number] Date of Birth: Nov 21, 1930 Referring Provider (PT): Charlett Blake MD  CLINIC OPERATION CHANGES: [facility] is open at lower capacity following universal masking, social distancing, and patient screening.  The patient's COVID risk of complications score is 7.   Encounter Date: 03/20/2019  PT End of Session - 03/21/19 1512    Visit Number  11    Number of Visits  29    Date for PT Re-Evaluation  05/16/19    Authorization Type  Medicare & Tricare    Authorization Time Period  [employer] will pick up 100% after Medicare    PT Start Time  1403    PT Stop Time  1450    PT Time Calculation (min)  47 min    Equipment Utilized During Treatment  Gait belt    Activity Tolerance  Patient tolerated treatment well   c/o increased Rt knee instability since fall sustained last week   Behavior During Therapy  WFL for tasks assessed/performed       Past Medical History:  Diagnosis Date  . Anemia   . Basal cell carcinoma 2018   R temple   . Basal cell carcinoma of nose 2002   bridge of nose  . BPH with obstruction/lower urinary tract symptoms    failed flomax, uroxatral, myrbetriq - sees urology [name]) pending videourodynamics  . CKD (chronic kidney disease) stage 3, GFR 30-59 ml/min (HCC) 03/11/2012   Renal US 02/2014 - multiple kidney cysts   . CVA (cerebral infarction) 03/07/12   slurred speech; right hemplegia, left handed - completed PT 07/2014 (17 sessions)  . Depression    mild, on cymbalta per prior PCP  . Erectile dysfunction    per urology (prostaglandin injection)  . GERD (gastroesophageal reflux disease)    with sliding HH  . H/O hiatal hernia   . History of kidney problems 12/2010   lacerated left kidney after  fall  . HLD (hyperlipidemia)   . Hx of basal cell carcinoma 07/10/2017   R. lat. forehead near hair line  . Kidney cysts 02/2014   bilateral (renal US)  . Migraines 03/11/12   now rare  . OSA on CPAP    sleep study 01/2014 - severe, CPAP at 10, previously on nuvigil (Dr. [name])  . Palpitations 01/2012   holter WNL, rare PAC/PVCs  . Presbycusis of both ears 2016   rec amplification [facility])  . Restless leg syndrome    well controlled on mirapex  . Rosacea    [place]  . TIA (transient ischemic attack)   . Type 2 diabetes, controlled, with neuropathy [facility]) 2011   [name] in [facility]    Past Surgical History:  Procedure Laterality Date  . ABI  2007   WNL  . CARDIAC CATHETERIZATION  2004   normal; [place]  . CARDIOVASCULAR STRESS TEST  02/2014   WNL, low risk scan, EF 68%  . CAROTID ENDARTERECTOMY    . CATARACT EXTRACTION W/ INTRAOCULAR LENS IMPLANT  11//2012 - 08/2011  . COLONOSCOPY  03/2011   poor prep, unable to biopsy polyp in tic fold, rpt 3 mo [name])  . COLONOSCOPY  06/2011   mult polyps adenomatous, severe diverticulosis, rpt 3 yrs [name])  . CYSTOSCOPY WITH URETHRAL DILATATION N/A 07/24/2013   Procedure: CYSTOSCOPY  WITH URETHRAL DILATATION;  Surgeon: [name], MD;  Location: [facility];  Service: Urology;  Laterality: N/A;  . ESOPHAGOGASTRODUODENOSCOPY  2006   duodenitis, medual HH, Grade 2 reflux, mild gastritis  . ESOPHAGOGASTRODUODENOSCOPY  07/2011   mild chronic gastritis  . [place]  . [place]; 2002   left, then repaired  . MRI brain  04/2010   WNL  . PROSTATE SURGERY  02/2009   PVP (laser)  . SKIN CANCER EXCISION  2002   bridge of nose, [facility]  . TONSILLECTOMY AND ADENOIDECTOMY  ~ 1945  . TRANSURETHRAL INCISION OF PROSTATE N/A 07/24/2013   Procedure: TRANSURETHRAL INCISION OF THE PROSTATE (TUIP);  Surgeon: [name], MD;  Location: [facility];  Service: Urology;  Laterality: N/A;  . URETHRAL DILATION  2014    [place]    Vitals:   03/20/19 1506  BP: 139/65  Pulse: 73  SpO2: 95%    Subjective Assessment - 03/21/19 1504    Subjective  Pt reports he is not feeling that well today - feels very tired; pt also states he has mild discomfort in his chest - wonders if it is from having lifted and carried his groceries earlier this am; wants to proceed with PT session    Pertinent History  left subcortical infarct on 03/11/2014. CVA, DM2, neuropathy, restless leg syndrome, HTN    Limitations  Lifting;Standing;Walking;House hold activities    Patient Stated Goals  improve strength of right leg. improve walking & balance without falls.     Currently in Pain?  No/denies   no true pain - mild discomfort feeling in chest                      [facility] Adult PT Treatment/Exercise - 03/21/19 0001      Transfers   Transfers  Sit to Stand    Sit to Stand  5: Supervision    Stand to Sit  5: Supervision    Number of Reps  Other reps (comment)   3     Ambulation/Gait   Ambulation/Gait  Yes    Ambulation/Gait Assistance  4: Min guard    Ambulation Distance (Feet)  115 Feet    Assistive device  Straight cane   with rubber quad tip   Gait Pattern  Step-through pattern;Decreased arm swing - right;Decreased step length - left;Decreased stance time - right;Decreased stride length;Decreased weight shift to right;Narrow base of support    Ambulation Surface  Level;Indoor    Stairs  Yes    Stairs Assistance  5: Supervision    Stair Management Technique  Two rails;Alternating pattern;Step to pattern;Forwards   alternating ascending; step to with descension   Number of Stairs  4    Height of Stairs  6      Knee/Hip Exercises: Machines for Strengthening   Cybex Leg Press  bil. LE's 60# 15 reps:  RLE only 40# 2 sets 10 reps      Knee/Hip Exercises: Standing   Forward Step Up  Right;1 set;10 reps;Hand Hold: 2;Step Height: 6"    Step Down  Right;1 set;10 reps;Hand Hold: 2;Step Height: 6"           Balance Exercises - 03/21/19 1511      Balance Exercises: Standing   Rockerboard  Anterior/posterior;Other reps (comment);UE support   3 sets 10 reps with bil. UE support inside // bars   Other Standing Exercises  tap ups to 1st step  10 reps each leg with CGA without UE support on rail          PT Short Term Goals - 03/21/19 1515      PT SHORT TERM GOAL #1   Title  Patient demostrates understanding of updated HEP. (All STGs Target Date: 04/18/2019)    Time  1    Period  Months    Status  On-going    Target Date  04/18/19      PT SHORT TERM GOAL #2   Title  Patient ambulates 300' outdoors on paved surfaces with straight cane with quad tip & AFO with supervision.    Time  1    Period  Months    Status  Revised    Target Date  04/18/19      PT SHORT TERM GOAL #3   Title  Patient negotiates outdoor ramps & curbs with cane with quad tip with supervision.    Time  1    Period  Months    Status  Revised    Target Date  04/18/19      PT SHORT TERM GOAL #4   Title  Timed Up & Go with cane with quad tip <15.5 seconds.    Time  1    Period  Months    Status  Revised    Target Date  04/18/19      PT SHORT TERM GOAL #5   Title  Patient able to maintain path & balance with scanning environment ambulating with cane quad tip with supervision.    Time  1    Period  Months    Status  New    Target Date  04/18/19        PT Long Term Goals - 03/21/19 1515      PT LONG TERM GOAL #1   Title  Patient verbalizes & demonstrates understanding of ongoing HEP / fitness plan. (All LTGs Target Date: 05/16/2019)    Time  3    Period  Months    Status  On-going      PT LONG TERM GOAL #2   Title  Gait Velocity with cane with quad tip comfortable pace >/= 2.1 ft/sec and fast >/= 2.5 ft/sec    Time  3    Period  Months    Status  Revised      PT LONG TERM GOAL #3   Title  Timed Up & Go with cane with quad tip <13.5 seconds to indicate lower fall risk.     Time  3    Period   Months    Status  Revised      PT LONG TERM GOAL #4   Title  Dynamic Gait Index with Cane with quad tip >12/24 to indicate lower fall risk    Time  3    Period  Months    Status  Revised      PT LONG TERM GOAL #5   Title  Patient negotiates ramps & curbs with cane with quad tip modified independent.     Time  3    Period  Months    Status  Revised      PT LONG TERM GOAL #6   Title  Cognitive Timed Up & Go with cane with quad tip <20sec & continues naming activity to indicate lower fall risk when distracted    Time  3    Period  Months    Status  New  Plan - 03/21/19 1513    Clinical Impression Statement  Pt performed activities well with good balance despite c/o not feeling well today; pt reported he was feeling much better after approx. 15" activity in session. Pt progressing towards goals.    Personal Factors and Comorbidities  Age;Comorbidity 3+;Time since onset of injury/illness/exacerbation;Other   lives alone   Comorbidities  CVA, DM, neuropathy, HTN, restless leg syndrome    Examination-Activity Limitations  Lift;Locomotion Level;Stairs;Stand;Other   balance   Examination-Participation Restrictions  Community Activity    Stability/Clinical Decision Making  Evolving/Moderate complexity    Rehab Potential  Good    PT Frequency  2x / week    PT Duration  12 weeks    PT Treatment/Interventions  ADLs/Self Care Home Management;DME Instruction;Gait training;Stair training;Functional mobility training;Therapeutic activities;Therapeutic exercise;Balance training;Neuromuscular re-education;Patient/family education;Orthotic Fit/Training;Vestibular    PT Next Visit Plan  Work towards updated STGs, work on gait with straight cane quad tip including scanning & curb/threshold negotiation    Consulted and Agree with Plan of Care  Patient       Patient will benefit from skilled therapeutic intervention in order to improve the following deficits and impairments:   Abnormal gait, Decreased activity tolerance, Decreased balance, Decreased coordination, Decreased endurance, Decreased mobility, Decreased range of motion, Decreased strength, Impaired tone, Impaired flexibility, Postural dysfunction, Other (comment)(orthotic dependency)  Visit Diagnosis: 1. Other abnormalities of gait and mobility   2. Unsteadiness on feet   3. Muscle weakness (generalized)        Problem List Patient Active Problem List   Diagnosis Date Noted  . Aphasia as late effect of stroke 03/11/2019  . Dyslipidemia 03/11/2019  . Hx of colonic polyps 03/11/2019  . Diabetic nephropathy ([place]) 03/11/2019  . Hypertensive renal disease 03/11/2019  . Hypothyroidism 03/11/2019  . Iron deficiency anemia due to chronic blood loss 03/11/2019  . Iron deficiency anemia 03/11/2019  . Polyneuropathy due to type 2 diabetes mellitus ([place]) 03/11/2019  . Weight loss 03/11/2019  . GERD without esophagitis 03/11/2019  . Acute respiratory infection 02/26/2019  . Type 2 diabetes mellitus with diabetic nephropathy ([place]) 11/29/2018  . Headache 10/30/2018  . Peripheral neuropathy 01/15/2018  . Left wrist pain 01/15/2018  . Hx of basal cell carcinoma 07/10/2017  . Rosacea   . Gait disturbance, post-stroke 10/05/2015  . Presbycusis of both ears   . Advanced care planning/counseling discussion 06/23/2015  . Impaired mobility 04/23/2015  . HTN (hypertension) 03/18/2015  . Chronic diastolic heart failure, NYHA class 1 ([place]) 03/18/2015  . Major depressive disorder, single episode 01/19/2015  . Benign prostatic hyperplasia with lower urinary tract symptoms 01/19/2015  . Familial multiple lipoprotein-type hyperlipidemia 01/19/2015  . Spastic hemiparesis affecting dominant side ([place]) 07/07/2014  . Spastic hemiplegia affecting dominant side ([place]) 07/07/2014  . Shoulder joint pain 03/16/2014  . Chest pressure 02/03/2014  . Other chest pain 02/03/2014  . Medicare annual wellness visit, subsequent  11/06/2013  . Erectile dysfunction due to arterial insufficiency 02/05/2013  . ED (erectile dysfunction) of organic origin 02/05/2013  . MDD (major depressive disorder), recurrent episode, moderate ([place])   . Palpitations 06/26/2012  . HLD (hyperlipidemia)   . Restless leg syndrome   . GERD (gastroesophageal reflux disease)   . BPH with obstruction/lower urinary tract symptoms   . History of CVA (cerebrovascular accident) 03/11/2012  . Hemiparesis affecting right side as late effect of stroke ([place]) 03/11/2012  . Type 2 diabetes, controlled, with neuropathy ([place]) 03/11/2012  . OSA on CPAP 03/11/2012  .  CKD stage 3 secondary to diabetes ([place]) 03/11/2012  . Migraines 03/11/2012  . Cerebral artery occlusion with cerebral infarction ([place]) 03/11/2012  . Type 2 diabetes mellitus with diabetic neuropathy ([place]) 03/11/2012  . Hemiplegia ([place]) 03/11/2012    Colbi Staubs, James Bell, PT 03/21/2019, 3:16 PM  [facility] [address] [place], [name], [address] Phone: [phone]   Fax:  [phone]  Name: James ALEMAN, MD MRN: [id number] Date of Birth: March 14, 1931

## 2019-03-21 NOTE — Progress Notes (Signed)
I reviewed health advisor's note, was available for consultation, and agree with documentation and plan.  

## 2019-03-24 ENCOUNTER — Ambulatory Visit: Payer: Medicare Other | Admitting: Physical Medicine & Rehabilitation

## 2019-03-24 ENCOUNTER — Encounter: Payer: Medicare Other | Attending: Physical Medicine & Rehabilitation | Admitting: Physical Medicine & Rehabilitation

## 2019-03-24 DIAGNOSIS — R269 Unspecified abnormalities of gait and mobility: Secondary | ICD-10-CM | POA: Insufficient documentation

## 2019-03-24 DIAGNOSIS — I69398 Other sequelae of cerebral infarction: Secondary | ICD-10-CM | POA: Insufficient documentation

## 2019-03-25 ENCOUNTER — Encounter: Payer: Self-pay | Admitting: Physical Therapy

## 2019-03-25 ENCOUNTER — Other Ambulatory Visit: Payer: Self-pay

## 2019-03-25 ENCOUNTER — Ambulatory Visit: Payer: Medicare Other | Admitting: Physical Therapy

## 2019-03-25 DIAGNOSIS — M6281 Muscle weakness (generalized): Secondary | ICD-10-CM

## 2019-03-25 DIAGNOSIS — R2681 Unsteadiness on feet: Secondary | ICD-10-CM

## 2019-03-25 DIAGNOSIS — R293 Abnormal posture: Secondary | ICD-10-CM | POA: Diagnosis not present

## 2019-03-25 DIAGNOSIS — R2689 Other abnormalities of gait and mobility: Secondary | ICD-10-CM

## 2019-03-25 DIAGNOSIS — R296 Repeated falls: Secondary | ICD-10-CM | POA: Diagnosis not present

## 2019-03-25 DIAGNOSIS — R261 Paralytic gait: Secondary | ICD-10-CM | POA: Diagnosis not present

## 2019-03-25 NOTE — Therapy (Signed)
[facility] [address] [place], [name], [address] Phone: [phone]   Fax:  [phone]  Physical Therapy Treatment  Patient Details  Name: James BURSTON, MD MRN: [id number] Date of Birth: 05-Oct-1930 Referring Provider (PT): Charlett Blake MD   Encounter Date: 03/25/2019   CLINIC OPERATION CHANGES: [facility] is open at lower capacity following universal masking, social distancing, and patient screening.  The patient's COVID risk of complications score is 7.   PT End of Session - 03/25/19 1004    Visit Number  12    Number of Visits  29    Date for PT Re-Evaluation  05/16/19    Authorization Type  Medicare & T[employer]    Authorization Time Period  [employer] will pick up 100% after Medicare    PT Start Time  0847    PT Stop Time  0945    PT Time Calculation (min)  58 min    Equipment Utilized During Treatment  Gait belt    Activity Tolerance  Patient tolerated treatment well   c/o increased Rt knee instability since fall sustained last week   Behavior During Therapy  WFL for tasks assessed/performed       Past Medical History:  Diagnosis Date  . Anemia   . Basal cell carcinoma 2018   R temple   . Basal cell carcinoma of nose 2002   bridge of nose  . BPH with obstruction/lower urinary tract symptoms    failed flomax, uroxatral, myrbetriq - sees urology [name]) pending videourodynamics  . CKD (chronic kidney disease) stage 3, GFR 30-59 ml/min (HCC) 03/11/2012   Renal US 02/2014 - multiple kidney cysts   . CVA (cerebral infarction) 03/07/12   slurred speech; right hemplegia, left handed - completed PT 07/2014 (17 sessions)  . Depression    mild, on cymbalta per prior PCP  . Erectile dysfunction    per urology (prostaglandin injection)  . GERD (gastroesophageal reflux disease)    with sliding HH  . H/O hiatal hernia   . History of kidney problems 12/2010   lacerated left kidney after  fall  . HLD (hyperlipidemia)   . Hx of basal cell carcinoma 07/10/2017   R. lat. forehead near hair line  . Kidney cysts 02/2014   bilateral (renal US)  . Migraines 03/11/12   now rare  . OSA on CPAP    sleep study 01/2014 - severe, CPAP at 10, previously on nuvigil (Dr. [name])  . Palpitations 01/2012   holter WNL, rare PAC/PVCs  . Presbycusis of both ears 2016   rec amplification [facility])  . Restless leg syndrome    well controlled on mirapex  . Rosacea    [place]  . TIA (transient ischemic attack)   . Type 2 diabetes, controlled, with neuropathy [facility]) 2011   [name] in [facility]    Past Surgical History:  Procedure Laterality Date  . ABI  2007   WNL  . CARDIAC CATHETERIZATION  2004   normal; [place]  . CARDIOVASCULAR STRESS TEST  02/2014   WNL, low risk scan, EF 68%  . CAROTID ENDARTERECTOMY    . CATARACT EXTRACTION W/ INTRAOCULAR LENS IMPLANT  11//2012 - 08/2011  . COLONOSCOPY  03/2011   poor prep, unable to biopsy polyp in tic fold, rpt 3 mo [name])  . COLONOSCOPY  06/2011   mult polyps adenomatous, severe diverticulosis, rpt 3 yrs [name])  . CYSTOSCOPY WITH URETHRAL DILATATION N/A 07/24/2013  Procedure: CYSTOSCOPY WITH URETHRAL DILATATION;  Surgeon: [name], MD;  Location: [facility];  Service: Urology;  Laterality: N/A;  . ESOPHAGOGASTRODUODENOSCOPY  2006   duodenitis, medual HH, Grade 2 reflux, mild gastritis  . ESOPHAGOGASTRODUODENOSCOPY  07/2011   mild chronic gastritis  . [place]  . [place]; 2002   left, then repaired  . MRI brain  04/2010   WNL  . PROSTATE SURGERY  02/2009   PVP (laser)  . SKIN CANCER EXCISION  2002   bridge of nose, BCC  . TONSILLECTOMY AND ADENOIDECTOMY  ~ 1945  . TRANSURETHRAL INCISION OF PROSTATE N/A 07/24/2013   Procedure: TRANSURETHRAL INCISION OF THE PROSTATE (TUIP);  Surgeon: [name], MD;  Location: [facility];  Service: Urology;  Laterality: N/A;  . URETHRAL DILATION  2014    [place]    There were no vitals filed for this visit.  Subjective Assessment - 03/25/19 0847    Subjective  No falls. He has not had any chest pain since last PT session.    Pertinent History  left subcortical infarct on 03/11/2014. CVA, DM2, neuropathy, restless leg syndrome, HTN    Limitations  Lifting;Standing;Walking;House hold activities    Patient Stated Goals  improve strength of right leg. improve walking & balance without falls.     Currently in Pain?  No/denies                       [facility] Adult PT Treatment/Exercise - 03/25/19 0850      Transfers   Transfers  Sit to Stand    Sit to Stand  5: Supervision;With upper extremity assist;From chair/3-in-1   chairs without armrests using LUE   Sit to Stand Details  Visual cues for safe use of DME/AE;Verbal cues for technique    Stand to Sit  5: Supervision;With upper extremity assist;To chair/3-in-1   chairs without armrest using LUE   Stand to Sit Details (indicate cue type and reason)  Visual cues for safe use of DME/AE;Verbal cues for technique      Ambulation/Gait   Ambulation/Gait  Yes    Ambulation/Gait Assistance  4: Min guard;5: Supervision    Ambulation/Gait Assistance Details  trialed leather toe cap which improved clearance;  used 2 different canes with quad tip to determine if wt & handle grip made difference. Pt did better with     Ambulation Distance (Feet)  115 Feet    Assistive device  Straight cane   with rubber quad tip   Gait Pattern  Step-through pattern;Decreased arm swing - right;Decreased step length - left;Decreased stance time - right;Decreased stride length;Decreased weight shift to right;Narrow base of support    Ambulation Surface  Indoor;Level    Stairs  --    Stairs Assistance  --    Stair Management Technique  --    Number of Stairs  --    Height of Stairs  --      Neuro Re-ed    Neuro Re-ed Details   Visual cues with mirror, PT demo & verbal cues on sitting posture with  equal wt bearing (not leaning left) and LEs supported on floor (not dangling).  Standing balance with equal wt bearing rt/left with RUE contact with counter / surface to facilitate not leaning left.  When performing standing ADLs focus on starting activity with midline pelvis for even weight bearing before starting activity like brushing teeth, washing hands, etc.  Pt verbalized &  return demo understanding including connection to intermittent LBP left side.       Knee/Hip Exercises: Machines for Strengthening   Cybex Leg Press  --      Knee/Hip Exercises: Standing   Forward Step Up  --    Step Down  --             PT Education - 03/25/19 1004    Education Details  cane design & weight, quad tip design & weight, leather toe cap, shoe width & depth over increasing length with AFOs.   Arthritic back pain with sitting & standing posture, stiffness with same position too long and pain if suddenly increase activity level    Person(s) Educated  Patient    Methods  Explanation;Demonstration;Verbal cues;Other (comment)   PT wrote note with recommendations   Comprehension  Verbalized understanding       PT Short Term Goals - 03/21/19 1515      PT SHORT TERM GOAL #1   Title  Patient demostrates understanding of updated HEP. (All STGs Target Date: 04/18/2019)    Time  1    Period  Months    Status  On-going    Target Date  04/18/19      PT SHORT TERM GOAL #2   Title  Patient ambulates 300' outdoors on paved surfaces with straight cane with quad tip & AFO with supervision.    Time  1    Period  Months    Status  Revised    Target Date  04/18/19      PT SHORT TERM GOAL #3   Title  Patient negotiates outdoor ramps & curbs with cane with quad tip with supervision.    Time  1    Period  Months    Status  Revised    Target Date  04/18/19      PT SHORT TERM GOAL #4   Title  Timed Up & Go with cane with quad tip <15.5 seconds.    Time  1    Period  Months    Status  Revised    Target  Date  04/18/19      PT SHORT TERM GOAL #5   Title  Patient able to maintain path & balance with scanning environment ambulating with cane quad tip with supervision.    Time  1    Period  Months    Status  New    Target Date  04/18/19        PT Long Term Goals - 03/21/19 1515      PT LONG TERM GOAL #1   Title  Patient verbalizes & demonstrates understanding of ongoing HEP / fitness plan. (All LTGs Target Date: 05/16/2019)    Time  3    Period  Months    Status  On-going      PT LONG TERM GOAL #2   Title  Gait Velocity with cane with quad tip comfortable pace >/= 2.1 ft/sec and fast >/= 2.5 ft/sec    Time  3    Period  Months    Status  Revised      PT LONG TERM GOAL #3   Title  Timed Up & Go with cane with quad tip <13.5 seconds to indicate lower fall risk.     Time  3    Period  Months    Status  Revised      PT LONG TERM GOAL #4   Title  Dynamic Gait Index  with Cane with quad tip >12/24 to indicate lower fall risk    Time  3    Period  Months    Status  Revised      PT LONG TERM GOAL #5   Title  Patient negotiates ramps & curbs with cane with quad tip modified independent.     Time  3    Period  Months    Status  Revised      PT LONG TERM GOAL #6   Title  Cognitive Timed Up & Go with cane with quad tip <20sec & continues naming activity to indicate lower fall risk when distracted    Time  3    Period  Months    Status  New            Plan - 03/25/19 1024    Clinical Impression Statement  PT contacted [facility] & patient to recieve new AFO after PT today. PT worked on patient education on type of cane, quad tip, shoes & leather toe cap. Patient ambulated better with cane that is slightly heavier with larger grip. The leather toe cap simulator improved his right foot clearance. PT also educated patient on sitting & standing posture and relationship to arthritic back pain.    Personal Factors and Comorbidities  Age;Comorbidity 3+;Time since onset of  injury/illness/exacerbation;Other   lives alone   Comorbidities  CVA, DM, neuropathy, HTN, restless leg syndrome    Examination-Activity Limitations  Lift;Locomotion Level;Stairs;Stand;Other   balance   Examination-Participation Restrictions  Community Activity    Stability/Clinical Decision Making  Evolving/Moderate complexity    Rehab Potential  Good    PT Frequency  2x / week    PT Duration  12 weeks    PT Treatment/Interventions  ADLs/Self Care Home Management;DME Instruction;Gait training;Stair training;Functional mobility training;Therapeutic activities;Therapeutic exercise;Balance training;Neuromuscular re-education;Patient/family education;Orthotic Fit/Training;Vestibular    PT Next Visit Plan  Work towards updated STGs, assess gait & balance with new AFO    Consulted and Agree with Plan of Care  Patient       Patient will benefit from skilled therapeutic intervention in order to improve the following deficits and impairments:  Abnormal gait, Decreased activity tolerance, Decreased balance, Decreased coordination, Decreased endurance, Decreased mobility, Decreased range of motion, Decreased strength, Impaired tone, Impaired flexibility, Postural dysfunction, Other (comment)(orthotic dependency)  Visit Diagnosis: 1. Unsteadiness on feet   2. Muscle weakness (generalized)   3. Abnormal posture   4. Other abnormalities of gait and mobility        Problem List Patient Active Problem List   Diagnosis Date Noted  . Aphasia as late effect of stroke 03/11/2019  . Dyslipidemia 03/11/2019  . Hx of colonic polyps 03/11/2019  . Diabetic nephropathy ([place]) 03/11/2019  . Hypertensive renal disease 03/11/2019  . Hypothyroidism 03/11/2019  . Iron deficiency anemia due to chronic blood loss 03/11/2019  . Iron deficiency anemia 03/11/2019  . Polyneuropathy due to type 2 diabetes mellitus ([place]) 03/11/2019  . Weight loss 03/11/2019  . GERD without esophagitis 03/11/2019  . Acute  respiratory infection 02/26/2019  . Type 2 diabetes mellitus with diabetic nephropathy ([place]) 11/29/2018  . Headache 10/30/2018  . Peripheral neuropathy 01/15/2018  . Left wrist pain 01/15/2018  . Hx of basal cell carcinoma 07/10/2017  . Rosacea   . Gait disturbance, post-stroke 10/05/2015  . Presbycusis of both ears   . Advanced care planning/counseling discussion 06/23/2015  . Impaired mobility 04/23/2015  . HTN (hypertension) 03/18/2015  . Chronic diastolic heart  failure, NYHA class 1 ([place]) 03/18/2015  . Major depressive disorder, single episode 01/19/2015  . Benign prostatic hyperplasia with lower urinary tract symptoms 01/19/2015  . Familial multiple lipoprotein-type hyperlipidemia 01/19/2015  . Spastic hemiparesis affecting dominant side ([place]) 07/07/2014  . Spastic hemiplegia affecting dominant side ([place]) 07/07/2014  . Shoulder joint pain 03/16/2014  . Chest pressure 02/03/2014  . Other chest pain 02/03/2014  . Medicare annual wellness visit, subsequent 11/06/2013  . Erectile dysfunction due to arterial insufficiency 02/05/2013  . ED (erectile dysfunction) of organic origin 02/05/2013  . MDD (major depressive disorder), recurrent episode, moderate ([place])   . Palpitations 06/26/2012  . HLD (hyperlipidemia)   . Restless leg syndrome   . GERD (gastroesophageal reflux disease)   . BPH with obstruction/lower urinary tract symptoms   . History of CVA (cerebrovascular accident) 03/11/2012  . Hemiparesis affecting right side as late effect of stroke ([place]) 03/11/2012  . Type 2 diabetes, controlled, with neuropathy ([place]) 03/11/2012  . OSA on CPAP 03/11/2012  . CKD stage 3 secondary to diabetes ([place]) 03/11/2012  . Migraines 03/11/2012  . Cerebral artery occlusion with cerebral infarction ([place]) 03/11/2012  . Type 2 diabetes mellitus with diabetic neuropathy ([place]) 03/11/2012  . Hemiplegia ([place]) 03/11/2012    Alesia Oshields PT, DPT 03/25/2019, 11:48 AM  [facility] [address] [place], [name], [address] Phone: [phone]   Fax:  [phone]  Name: KAYNE YUHAS, MD MRN: [id number] Date of Birth: [birth year]

## 2019-03-27 ENCOUNTER — Other Ambulatory Visit: Payer: Self-pay

## 2019-03-27 ENCOUNTER — Ambulatory Visit: Payer: Medicare Other | Admitting: Physical Therapy

## 2019-03-27 DIAGNOSIS — R2689 Other abnormalities of gait and mobility: Secondary | ICD-10-CM

## 2019-03-27 DIAGNOSIS — M6281 Muscle weakness (generalized): Secondary | ICD-10-CM

## 2019-03-27 DIAGNOSIS — R2681 Unsteadiness on feet: Secondary | ICD-10-CM

## 2019-03-27 DIAGNOSIS — R296 Repeated falls: Secondary | ICD-10-CM | POA: Diagnosis not present

## 2019-03-27 DIAGNOSIS — R293 Abnormal posture: Secondary | ICD-10-CM | POA: Diagnosis not present

## 2019-03-27 DIAGNOSIS — R261 Paralytic gait: Secondary | ICD-10-CM | POA: Diagnosis not present

## 2019-03-28 NOTE — Therapy (Signed)
[facility] [address] [place], [name], [address] Phone: [phone]   Fax:  [phone]  Physical Therapy Treatment  Patient Details  Name: James CORTOPASSI, MD MRN: [id number] Date of Birth: 03/08/1931 Referring Provider (PT): Charlett Blake MD  CLINIC OPERATION CHANGES: [facility]patient [facility] is open at lower capacity following universal masking, social distancing, and patient screening.  The patient's COVID risk of complications score is 7.  Encounter Date: 03/27/2019  PT End of Session - 03/28/19 1121    Visit Number  13    Number of Visits  29    Date for PT Re-Evaluation  05/16/19    Authorization Type  Medicare & Tricare    Authorization Time Period  [employer] will pick up 100% after Medicare    PT Start Time  1750    PT Stop Time  1837    PT Time Calculation (min)  47 min    Equipment Utilized During Treatment  Gait belt    Activity Tolerance  Patient tolerated treatment well   c/o increased Rt knee instability since fall sustained last week   Behavior During Therapy  WFL for tasks assessed/performed       Past Medical History:  Diagnosis Date  . Anemia   . Basal cell carcinoma 2018   R temple   . Basal cell carcinoma of nose 2002   bridge of nose  . BPH with obstruction/lower urinary tract symptoms    failed flomax, uroxatral, myrbetriq - sees urology [name]) pending videourodynamics  . CKD (chronic kidney disease) stage 3, GFR 30-59 ml/min (HCC) 03/11/2012   Renal US 02/2014 - multiple kidney cysts   . CVA (cerebral infarction) 03/07/12   slurred speech; right hemplegia, left handed - completed PT 07/2014 (17 sessions)  . Depression    mild, on cymbalta per prior PCP  . Erectile dysfunction    per urology (prostaglandin injection)  . GERD (gastroesophageal reflux disease)    with sliding HH  . H/O hiatal hernia   . History of kidney problems 12/2010   lacerated left kidney after fall   . HLD (hyperlipidemia)   . Hx of basal cell carcinoma 07/10/2017   R. lat. forehead near hair line  . Kidney cysts 02/2014   bilateral (renal US)  . Migraines 03/11/12   now rare  . OSA on CPAP    sleep study 01/2014 - severe, CPAP at 10, previously on nuvigil (Dr. [name])  . Palpitations 01/2012   holter WNL, rare PAC/PVCs  . Presbycusis of both ears 2016   rec amplification [facility])  . Restless leg syndrome    well controlled on mirapex  . Rosacea    [place]  . TIA (transient ischemic attack)   . Type 2 diabetes, controlled, with neuropathy [facility]) 2011   [name] in [facility]    Past Surgical History:  Procedure Laterality Date  . ABI  2007   WNL  . CARDIAC CATHETERIZATION  2004   normal; [place]  . CARDIOVASCULAR STRESS TEST  02/2014   WNL, low risk scan, EF 68%  . CAROTID ENDARTERECTOMY    . CATARACT EXTRACTION W/ INTRAOCULAR LENS IMPLANT  11//2012 - 08/2011  . COLONOSCOPY  03/2011   poor prep, unable to biopsy polyp in tic fold, rpt 3 mo [name])  . COLONOSCOPY  06/2011   mult polyps adenomatous, severe diverticulosis, rpt 3 yrs [name])  . CYSTOSCOPY WITH URETHRAL DILATATION N/A 07/24/2013   Procedure: CYSTOSCOPY WITH  URETHRAL DILATATION;  Surgeon: [name], MD;  Location: [facility];  Service: Urology;  Laterality: N/A;  . ESOPHAGOGASTRODUODENOSCOPY  2006   duodenitis, medual HH, Grade 2 reflux, mild gastritis  . ESOPHAGOGASTRODUODENOSCOPY  07/2011   mild chronic gastritis  . [place]  . [place]; 2002   left, then repaired  . MRI brain  04/2010   WNL  . PROSTATE SURGERY  02/2009   PVP (laser)  . SKIN CANCER EXCISION  2002   bridge of nose, BCC  . TONSILLECTOMY AND ADENOIDECTOMY  ~ 1945  . TRANSURETHRAL INCISION OF PROSTATE N/A 07/24/2013   Procedure: TRANSURETHRAL INCISION OF THE PROSTATE (TUIP);  Surgeon: [name], MD;  Location: [facility];  Service: Urology;  Laterality: N/A;  . URETHRAL DILATION  2014    tannenbaum    There were no vitals filed for this visit.  Subjective Assessment - 03/27/19 1757    Subjective  Pt now has new AFO from [employer]  - had to change to old shoes because the other shoes he had been wearing are too tight with the new AFO; pt states his toe on Rt foot is now sore from having worn the other shoes with the new AFO;  pt was told by primary PT, Jamey Reas, that he could take the AFO to [employer] for the footplate to be trimmed down    Pertinent History  left subcortical infarct on 03/11/2014. CVA, DM2, neuropathy, restless leg syndrome, HTN    Limitations  Lifting;Standing;Walking;House hold activities    Patient Stated Goals  improve strength of right leg. improve walking & balance without falls.                        [facility] Adult PT Treatment/Exercise - 03/28/19 0001      Transfers   Transfers  Sit to Stand    Sit to Stand  5: Supervision    Stand to Sit  5: Supervision    Number of Reps  Other reps (comment)   5     Ambulation/Gait   Ambulation/Gait  Yes    Ambulation/Gait Assistance  4: Min guard    Ambulation Distance (Feet)  115 Feet   2 reps with rest period between laps   Assistive device  Straight cane   with rubber quad tip   Gait Pattern  Step-through pattern;Decreased arm swing - right;Decreased step length - left;Decreased stance time - right;Decreased stride length;Decreased weight shift to right;Narrow base of support    Ambulation Surface  Level;Indoor    Stairs  Yes    Stairs Assistance  5: Supervision    Stair Management Technique  Two rails;Alternating pattern;Step to pattern;Forwards   alternating ascending; step to with descension   Number of Stairs  4    Height of Stairs  6    Gait Comments  pt had 1 moderate LOB occurrence when he turned to look at someone in gym on his left side - pt was using SPC with quad tip with gait training      Knee/Hip Exercises: Machines for Strengthening   Cybex Leg Press  bil. LE's 50# bil.  15 reps:  RLE only 30# 20 reps      Knee/Hip Exercises: Standing   Forward Step Up  Right;1 set;10 reps;Hand Hold: 2;Step Height: 6"          Balance Exercises - 03/28/19 1119      Balance Exercises:  Standing   Rockerboard  Anterior/posterior;Other reps (comment);UE support   3 sets 10 reps with bil. UE support inside // bars   Other Standing Exercises  tap ups to 1st step 10 reps each leg with CGA without UE support on rail      Agility ladder - negotiating ladder using a step over step sequence with use of SPC with quad tip - CGA for safety  Pt performed stepping over and back of balance beam inside // bars 10 reps each leg with UE support prn with CGA  PT Education - 03/28/19 1120    Education Details  pt instructed to call [employer] to have AFO trimmed to allow fit into his other shoes (by Jamey Reas, DPT - his primary PT)    Person(s) Educated  Patient    Methods  Explanation    Comprehension  Verbalized understanding       PT Short Term Goals - 03/28/19 1130      PT SHORT TERM GOAL #1   Title  Patient demostrates understanding of updated HEP. (All STGs Target Date: 04/18/2019)    Time  1    Period  Months    Status  On-going    Target Date  04/18/19      PT SHORT TERM GOAL #2   Title  Patient ambulates 300' outdoors on paved surfaces with straight cane with quad tip & AFO with supervision.    Time  1    Period  Months    Status  Revised    Target Date  04/18/19      PT SHORT TERM GOAL #3   Title  Patient negotiates outdoor ramps & curbs with cane with quad tip with supervision.    Time  1    Period  Months    Status  Revised    Target Date  04/18/19      PT SHORT TERM GOAL #4   Title  Timed Up & Go with cane with quad tip <15.5 seconds.    Time  1    Period  Months    Status  Revised    Target Date  04/18/19      PT SHORT TERM GOAL #5   Title  Patient able to maintain path & balance with scanning environment ambulating with cane quad tip with  supervision.    Time  1    Period  Months    Status  New    Target Date  04/18/19        PT Long Term Goals - 03/28/19 1130      PT LONG TERM GOAL #1   Title  Patient verbalizes & demonstrates understanding of ongoing HEP / fitness plan. (All LTGs Target Date: 05/16/2019)    Time  3    Period  Months    Status  On-going      PT LONG TERM GOAL #2   Title  Gait Velocity with cane with quad tip comfortable pace >/= 2.1 ft/sec and fast >/= 2.5 ft/sec    Time  3    Period  Months    Status  Revised      PT LONG TERM GOAL #3   Title  Timed Up & Go with cane with quad tip <13.5 seconds to indicate lower fall risk.     Time  3    Period  Months    Status  Revised      PT LONG TERM GOAL #4  Title  Dynamic Gait Index with Cane with quad tip >12/24 to indicate lower fall risk    Time  3    Period  Months    Status  Revised      PT LONG TERM GOAL #5   Title  Patient negotiates ramps & curbs with cane with quad tip modified independent.     Time  3    Period  Months    Status  Revised      PT LONG TERM GOAL #6   Title  Cognitive Timed Up & Go with cane with quad tip <20sec & continues naming activity to indicate lower fall risk when distracted    Time  3    Period  Months    Status  New            Plan - 03/28/19 1123    Clinical Impression Statement  Pt seen at 6:00 appt time due to him having missed his 1:00 appt - states he thought his appt was on Friday this week.  Pt reported fatigue with decreased energy noted to appt being at end of day - pt states he feels much better in the mornings.  Minimal change in gait noted with the new AFO, however, pt is reporting that his toe is sore from having worn the AFO in his other shoes which are too tight with the brace in it, and forced his toe into end of the shoe.  Pt does not the pressure from the strut on his Rt medial lower leg that he did with his other AFO.  PT continues to have LOB with amb. with use of SPC with head turns.     PT Frequency  2x / week    PT Treatment/Interventions  ADLs/Self Care Home Management;DME Instruction;Gait training;Stair training;Functional mobility training;Therapeutic activities;Therapeutic exercise;Balance training;Neuromuscular re-education;Patient/family education;Orthotic Fit/Training;Vestibular    PT Next Visit Plan  Work towards updated STGs, assess gait & balance with new AFO -  ( ROBIN - D/C at end of July? - not sure he needs PT til end of August)    Consulted and Agree with Plan of Care  Patient       Patient will benefit from skilled therapeutic intervention in order to improve the following deficits and impairments:     Visit Diagnosis: 1. Other abnormalities of gait and mobility   2. Muscle weakness (generalized)   3. Unsteadiness on feet        Problem List Patient Active Problem List   Diagnosis Date Noted  . Aphasia as late effect of stroke 03/11/2019  . Dyslipidemia 03/11/2019  . Hx of colonic polyps 03/11/2019  . Diabetic nephropathy ([place]) 03/11/2019  . Hypertensive renal disease 03/11/2019  . Hypothyroidism 03/11/2019  . Iron deficiency anemia due to chronic blood loss 03/11/2019  . Iron deficiency anemia 03/11/2019  . Polyneuropathy due to type 2 diabetes mellitus ([place]) 03/11/2019  . Weight loss 03/11/2019  . GERD without esophagitis 03/11/2019  . Acute respiratory infection 02/26/2019  . Type 2 diabetes mellitus with diabetic nephropathy ([place]) 11/29/2018  . Headache 10/30/2018  . Peripheral neuropathy 01/15/2018  . Left wrist pain 01/15/2018  . Hx of basal cell carcinoma 07/10/2017  . Rosacea   . Gait disturbance, post-stroke 10/05/2015  . Presbycusis of both ears   . Advanced care planning/counseling discussion 06/23/2015  . Impaired mobility 04/23/2015  . HTN (hypertension) 03/18/2015  . Chronic diastolic heart failure, NYHA class 1 ([place]) 03/18/2015  . Major depressive disorder,  single episode 01/19/2015  . Benign prostatic hyperplasia  with lower urinary tract symptoms 01/19/2015  . Familial multiple lipoprotein-type hyperlipidemia 01/19/2015  . Spastic hemiparesis affecting dominant side ([place]) 07/07/2014  . Spastic hemiplegia affecting dominant side ([place]) 07/07/2014  . Shoulder joint pain 03/16/2014  . Chest pressure 02/03/2014  . Other chest pain 02/03/2014  . Medicare annual wellness visit, subsequent 11/06/2013  . Erectile dysfunction due to arterial insufficiency 02/05/2013  . ED (erectile dysfunction) of organic origin 02/05/2013  . MDD (major depressive disorder), recurrent episode, moderate ([employer])   . Palpitations 06/26/2012  . HLD (hyperlipidemia)   . Restless leg syndrome   . GERD (gastroesophageal reflux disease)   . BPH with obstruction/lower urinary tract symptoms   . History of CVA (cerebrovascular accident) 03/11/2012  . Hemiparesis affecting right side as late effect of stroke ([place]) 03/11/2012  . Type 2 diabetes, controlled, with neuropathy ([place]) 03/11/2012  . OSA on CPAP 03/11/2012  . CKD stage 3 secondary to diabetes ([place]) 03/11/2012  . Migraines 03/11/2012  . Cerebral artery occlusion with cerebral infarction ([place]) 03/11/2012  . Type 2 diabetes mellitus with diabetic neuropathy ([place]) 03/11/2012  . Hemiplegia ([place]) 03/11/2012    Rosmery Duggin, [place], PT 03/28/2019, 11:33 AM  [facility] [address] [place], [name], [address] Phone: [phone]   Fax:  [phone]  Name: TYRI ELMORE, MD MRN: [id number] Date of Birth: 1931/02/24

## 2019-04-01 ENCOUNTER — Other Ambulatory Visit: Payer: Self-pay

## 2019-04-01 ENCOUNTER — Encounter: Payer: Self-pay | Admitting: Physical Therapy

## 2019-04-01 ENCOUNTER — Ambulatory Visit: Payer: Medicare Other | Admitting: Physical Therapy

## 2019-04-01 DIAGNOSIS — M6281 Muscle weakness (generalized): Secondary | ICD-10-CM | POA: Diagnosis not present

## 2019-04-01 DIAGNOSIS — R293 Abnormal posture: Secondary | ICD-10-CM

## 2019-04-01 DIAGNOSIS — R2681 Unsteadiness on feet: Secondary | ICD-10-CM

## 2019-04-01 DIAGNOSIS — R261 Paralytic gait: Secondary | ICD-10-CM | POA: Diagnosis not present

## 2019-04-01 DIAGNOSIS — R2689 Other abnormalities of gait and mobility: Secondary | ICD-10-CM

## 2019-04-01 DIAGNOSIS — R296 Repeated falls: Secondary | ICD-10-CM | POA: Diagnosis not present

## 2019-04-01 NOTE — Therapy (Signed)
[facility] [address] [place], [name], [address] Phone: [phone]   Fax:  [phone]  Physical Therapy Treatment  Patient Details  Name: James SHACKETT, MD MRN: [id number] Date of Birth: [birth year] Referring Provider (PT): Charlett Blake MD   Encounter Date: 04/01/2019   CLINIC OPERATION CHANGES: [facility] is open at lower capacity following universal masking, social distancing, and patient screening.  The patient's COVID risk of complications score is 7.   PT End of Session - 04/01/19 1000    Visit Number  14    Number of Visits  29    Date for PT Re-Evaluation  05/16/19    Authorization Type  Medicare & Tricare    Authorization Time Period  [employer] will pick up 100% after Medicare    PT Start Time  0900    PT Stop Time  0946    PT Time Calculation (min)  46 min    Equipment Utilized During Treatment  Gait belt    Activity Tolerance  Patient tolerated treatment well   c/o increased Rt knee instability since fall sustained last week   Behavior During Therapy  WFL for tasks assessed/performed       Past Medical History:  Diagnosis Date  . Anemia   . Basal cell carcinoma 2018   R temple   . Basal cell carcinoma of nose 2002   bridge of nose  . BPH with obstruction/lower urinary tract symptoms    failed flomax, uroxatral, myrbetriq - sees urology [name]) pending videourodynamics  . CKD (chronic kidney disease) stage 3, GFR 30-59 ml/min (HCC) 03/11/2012   Renal US 02/2014 - multiple kidney cysts   . CVA (cerebral infarction) 03/07/12   slurred speech; right hemplegia, left handed - completed PT 07/2014 (17 sessions)  . Depression    mild, on cymbalta per prior PCP  . Erectile dysfunction    per urology (prostaglandin injection)  . GERD (gastroesophageal reflux disease)    with sliding HH  . H/O hiatal hernia   . History of kidney problems 12/2010   lacerated left kidney after  fall  . HLD (hyperlipidemia)   . Hx of basal cell carcinoma 07/10/2017   R. lat. forehead near hair line  . Kidney cysts 02/2014   bilateral (renal US)  . Migraines 03/11/12   now rare  . OSA on CPAP    sleep study 01/2014 - severe, CPAP at 10, previously on nuvigil (Dr. [name])  . Palpitations 01/2012   holter WNL, rare PAC/PVCs  . Presbycusis of both ears 2016   rec amplification [facility])  . Restless leg syndrome    well controlled on mirapex  . Rosacea    [place]  . TIA (transient ischemic attack)   . Type 2 diabetes, controlled, with neuropathy [facility]) 2011   [name] in [facility]    Past Surgical History:  Procedure Laterality Date  . ABI  2007   WNL  . CARDIAC CATHETERIZATION  2004   normal; [place]  . CARDIOVASCULAR STRESS TEST  02/2014   WNL, low risk scan, EF 68%  . CAROTID ENDARTERECTOMY    . CATARACT EXTRACTION W/ INTRAOCULAR LENS IMPLANT  11//2012 - 08/2011  . COLONOSCOPY  03/2011   poor prep, unable to biopsy polyp in tic fold, rpt 3 mo [name])  . COLONOSCOPY  06/2011   mult polyps adenomatous, severe diverticulosis, rpt 3 yrs [name])  . CYSTOSCOPY WITH URETHRAL DILATATION N/A 07/24/2013  Procedure: CYSTOSCOPY WITH URETHRAL DILATATION;  Surgeon: [name], MD;  Location: [facility];  Service: Urology;  Laterality: N/A;  . ESOPHAGOGASTRODUODENOSCOPY  2006   duodenitis, medual HH, Grade 2 reflux, mild gastritis  . ESOPHAGOGASTRODUODENOSCOPY  07/2011   mild chronic gastritis  . [place]  . [place]; 2002   left, then repaired  . MRI brain  04/2010   WNL  . PROSTATE SURGERY  02/2009   PVP (laser)  . SKIN CANCER EXCISION  2002   bridge of nose, BCC  . TONSILLECTOMY AND ADENOIDECTOMY  ~ 1945  . TRANSURETHRAL INCISION OF PROSTATE N/A 07/24/2013   Procedure: TRANSURETHRAL INCISION OF THE PROSTATE (TUIP);  Surgeon: [name], MD;  Location: [facility];  Service: Urology;  Laterality: N/A;  . URETHRAL DILATION  2014    tannenbaum    There were no vitals filed for this visit.  Subjective Assessment - 04/01/19 0858    Subjective  He ordered new shoes & plans to have AFO plate trimmed to new shoes. He got cane that he ordered from [place] but is lighter & smaller handle. So he plans to return it & request other model.    Pertinent History  left subcortical infarct on 03/11/2014. CVA, DM2, neuropathy, restless leg syndrome, HTN    Limitations  Lifting;Standing;Walking;House hold activities    Patient Stated Goals  improve strength of right leg. improve walking & balance without falls.     Currently in Pain?  No/denies                       [facility] Adult PT Treatment/Exercise - 04/01/19 0900      Transfers   Transfers  Sit to Stand    Sit to Stand  5: Supervision    Stand to Sit  5: Supervision      Ambulation/Gait   Ambulation/Gait  Yes    Ambulation/Gait Assistance  4: Min guard;5: Supervision   Min guard with scan, negotiating & grass/gravel   Ambulation/Gait Assistance Details  Simulated leather toe cap on Rt shoe which improved ability to advance RLE.  tactile & verbal cues on balance reactions. He tends to scissor RLE when distracted by scanning or negotiating furniture.     Ambulation Distance (Feet)  400 Feet   200' X 2 indoors, ended session 400' outdoors to his car   Assistive device  Straight cane;Other (Comment)   with rubber quad tip & AFO   Gait Pattern  Step-through pattern;Decreased arm swing - right;Decreased step length - left;Decreased stance time - right;Decreased stride length;Decreased weight shift to right;Narrow base of support    Ambulation Surface  Indoor;Level;Outdoor;Paved;Gravel;Grass    Stairs  --    Manufacturing systems engineer  --    Stair Management Technique  --    Number of Stairs  --    Height of Stairs  --    Ramp  5: Supervision;4: Min assist   cane quad tip & AFO, MinA outdoors when ramp not expected   Ramp Details (indicate cue type and  reason)  tactile cues on balance reaction    Curb  4: Min assist   cane quad tip & AFO   Curb Details (indicate cue type and reason)  verbal cues on step through with 2nd LE for balance    Gait Comments  simulated threshold with runner under his door with aerobic step with rolled towel for runner simulation.  Demo,  tactile & verbal cues on sequence & technique with LLE stepping on runner & RLE stepping over runner, visualizing RLE placement descending       Knee/Hip Exercises: Machines for Strengthening   Cybex Leg Press  --      Knee/Hip Exercises: Standing   Forward Step Up  --             PT Education - 04/01/19 0959    Education Details  educated on leather toe cap and recommendation to have [employer] put on new right shoe    Person(s) Educated  Patient    Methods  Explanation;Demonstration;Verbal cues    Comprehension  Verbalized understanding;Need further instruction       PT Short Term Goals - 03/28/19 1130      PT SHORT TERM GOAL #1   Title  Patient demostrates understanding of updated HEP. (All STGs Target Date: 04/18/2019)    Time  1    Period  Months    Status  On-going    Target Date  04/18/19      PT SHORT TERM GOAL #2   Title  Patient ambulates 300' outdoors on paved surfaces with straight cane with quad tip & AFO with supervision.    Time  1    Period  Months    Status  Revised    Target Date  04/18/19      PT SHORT TERM GOAL #3   Title  Patient negotiates outdoor ramps & curbs with cane with quad tip with supervision.    Time  1    Period  Months    Status  Revised    Target Date  04/18/19      PT SHORT TERM GOAL #4   Title  Timed Up & Go with cane with quad tip <15.5 seconds.    Time  1    Period  Months    Status  Revised    Target Date  04/18/19      PT SHORT TERM GOAL #5   Title  Patient able to maintain path & balance with scanning environment ambulating with cane quad tip with supervision.    Time  1    Period  Months    Status  New     Target Date  04/18/19        PT Long Term Goals - 03/28/19 1130      PT LONG TERM GOAL #1   Title  Patient verbalizes & demonstrates understanding of ongoing HEP / fitness plan. (All LTGs Target Date: 05/16/2019)    Time  3    Period  Months    Status  On-going      PT LONG TERM GOAL #2   Title  Gait Velocity with cane with quad tip comfortable pace >/= 2.1 ft/sec and fast >/= 2.5 ft/sec    Time  3    Period  Months    Status  Revised      PT LONG TERM GOAL #3   Title  Timed Up & Go with cane with quad tip <13.5 seconds to indicate lower fall risk.     Time  3    Period  Months    Status  Revised      PT LONG TERM GOAL #4   Title  Dynamic Gait Index with Cane with quad tip >12/24 to indicate lower fall risk    Time  3    Period  Months    Status  Revised  PT LONG TERM GOAL #5   Title  Patient negotiates ramps & curbs with cane with quad tip modified independent.     Time  3    Period  Months    Status  Revised      PT LONG TERM GOAL #6   Title  Cognitive Timed Up & Go with cane with quad tip <20sec & continues naming activity to indicate lower fall risk when distracted    Time  3    Period  Months    Status  New            Plan - 04/01/19 1008    Clinical Impression Statement  Patient had improved RLE clearance with simulated leather toe cap.  Patient also improved negotiation of simulated threshold with runner like his home entrance that has caused falls.    PT Frequency  2x / week    PT Treatment/Interventions  ADLs/Self Care Home Management;DME Instruction;Gait training;Stair training;Functional mobility training;Therapeutic activities;Therapeutic exercise;Balance training;Neuromuscular re-education;Patient/family education;Orthotic Fit/Training;Vestibular    PT Next Visit Plan  Work towards updated STGs, assess gait & balance with new AFO    Consulted and Agree with Plan of Care  Patient       Patient will benefit from skilled therapeutic intervention  in order to improve the following deficits and impairments:     Visit Diagnosis: 1. Other abnormalities of gait and mobility   2. Muscle weakness (generalized)   3. Unsteadiness on feet   4. Abnormal posture   5. Repeated falls   6. Paralytic gait        Problem List Patient Active Problem List   Diagnosis Date Noted  . Aphasia as late effect of stroke 03/11/2019  . Dyslipidemia 03/11/2019  . Hx of colonic polyps 03/11/2019  . Diabetic nephropathy ([place]) 03/11/2019  . Hypertensive renal disease 03/11/2019  . Hypothyroidism 03/11/2019  . Iron deficiency anemia due to chronic blood loss 03/11/2019  . Iron deficiency anemia 03/11/2019  . Polyneuropathy due to type 2 diabetes mellitus ([place]) 03/11/2019  . Weight loss 03/11/2019  . GERD without esophagitis 03/11/2019  . Acute respiratory infection 02/26/2019  . Type 2 diabetes mellitus with diabetic nephropathy ([place]) 11/29/2018  . Headache 10/30/2018  . Peripheral neuropathy 01/15/2018  . Left wrist pain 01/15/2018  . Hx of basal cell carcinoma 07/10/2017  . Rosacea   . Gait disturbance, post-stroke 10/05/2015  . Presbycusis of both ears   . Advanced care planning/counseling discussion 06/23/2015  . Impaired mobility 04/23/2015  . HTN (hypertension) 03/18/2015  . Chronic diastolic heart failure, NYHA class 1 ([place]) 03/18/2015  . Major depressive disorder, single episode 01/19/2015  . Benign prostatic hyperplasia with lower urinary tract symptoms 01/19/2015  . Familial multiple lipoprotein-type hyperlipidemia 01/19/2015  . Spastic hemiparesis affecting dominant side ([place]) 07/07/2014  . Spastic hemiplegia affecting dominant side ([place]) 07/07/2014  . Shoulder joint pain 03/16/2014  . Chest pressure 02/03/2014  . Other chest pain 02/03/2014  . Medicare annual wellness visit, subsequent 11/06/2013  . Erectile dysfunction due to arterial insufficiency 02/05/2013  . ED (erectile dysfunction) of organic origin 02/05/2013  . MDD  (major depressive disorder), recurrent episode, moderate ([place])   . Palpitations 06/26/2012  . HLD (hyperlipidemia)   . Restless leg syndrome   . GERD (gastroesophageal reflux disease)   . BPH with obstruction/lower urinary tract symptoms   . History of CVA (cerebrovascular accident) 03/11/2012  . Hemiparesis affecting right side as late effect of stroke ([place]) 03/11/2012  . Type  2 diabetes, controlled, with neuropathy ([place]) 03/11/2012  . OSA on CPAP 03/11/2012  . CKD stage 3 secondary to diabetes ([place]) 03/11/2012  . Migraines 03/11/2012  . Cerebral artery occlusion with cerebral infarction ([place]) 03/11/2012  . Type 2 diabetes mellitus with diabetic neuropathy ([place]) 03/11/2012  . Hemiplegia ([place]) 03/11/2012    Ciela Mahajan PT, DPT 04/01/2019, 10:12 AM  [facility] [address] [place], [name], [address] Phone: [phone]   Fax:  [phone]  Name: JHONY ANTRIM, MD MRN: [id number] Date of Birth: [birth year]

## 2019-04-03 ENCOUNTER — Ambulatory Visit: Payer: Medicare Other | Admitting: Physical Therapy

## 2019-04-07 ENCOUNTER — Ambulatory Visit: Payer: Medicare Other | Admitting: Physical Therapy

## 2019-04-08 ENCOUNTER — Other Ambulatory Visit: Payer: Self-pay

## 2019-04-08 ENCOUNTER — Ambulatory Visit: Payer: Medicare Other | Admitting: Physical Therapy

## 2019-04-08 ENCOUNTER — Encounter: Payer: Self-pay | Admitting: Physical Therapy

## 2019-04-08 DIAGNOSIS — R293 Abnormal posture: Secondary | ICD-10-CM | POA: Diagnosis not present

## 2019-04-08 DIAGNOSIS — M6281 Muscle weakness (generalized): Secondary | ICD-10-CM

## 2019-04-08 DIAGNOSIS — R261 Paralytic gait: Secondary | ICD-10-CM | POA: Diagnosis not present

## 2019-04-08 DIAGNOSIS — R296 Repeated falls: Secondary | ICD-10-CM | POA: Diagnosis not present

## 2019-04-08 DIAGNOSIS — R2681 Unsteadiness on feet: Secondary | ICD-10-CM

## 2019-04-08 DIAGNOSIS — R2689 Other abnormalities of gait and mobility: Secondary | ICD-10-CM

## 2019-04-08 NOTE — Therapy (Signed)
[facility] [address] [place], [name], [address] Phone: [phone]   Fax:  [phone]  Physical Therapy Treatment  Patient Details  Name: James MCQUARRIE, James Bell MRN: [id number] Date of Birth: Oct 05, 1930 Referring Provider (PT): Charlett Blake James Bell   Encounter Date: 04/08/2019   CLINIC OPERATION CHANGES: [facility]patient [facility] is open at lower capacity following universal masking, social distancing, and patient screening.  The patient's COVID risk of complications score is 7.   PT End of Session - 04/08/19 0956    Visit Number  15    Number of Visits  29    Date for PT Re-Evaluation  05/16/19    Authorization Type  Medicare & Tricare    Authorization Time Period  T[employer]are will pick up 100% after Medicare    PT Start Time  0859    PT Stop Time  0940    PT Time Calculation (min)  41 min    Equipment Utilized During Treatment  Gait belt    Activity Tolerance  Patient tolerated treatment well   c/o increased Rt knee instability since fall sustained last week   Behavior During Therapy  WFL for tasks assessed/performed       Past Medical History:  Diagnosis Date  . Anemia   . Basal cell carcinoma 2018   R temple   . Basal cell carcinoma of nose 2002   bridge of nose  . BPH with obstruction/lower urinary tract symptoms    failed flomax, uroxatral, myrbetriq - sees urology [name]) pending videourodynamics  . CKD (chronic kidney disease) stage 3, GFR 30-59 ml/min (HCC) 03/11/2012   Renal US 02/2014 - multiple kidney cysts   . CVA (cerebral infarction) 03/07/12   slurred speech; right hemplegia, left handed - completed PT 07/2014 (17 sessions)  . Depression    mild, on cymbalta per prior PCP  . Erectile dysfunction    per urology (prostaglandin injection)  . GERD (gastroesophageal reflux disease)    with sliding HH  . H/O hiatal hernia   . History of kidney problems 12/2010   lacerated left kidney after  fall  . HLD (hyperlipidemia)   . Hx of basal cell carcinoma 07/10/2017   R. lat. forehead near hair line  . Kidney cysts 02/2014   bilateral (renal US)  . Migraines 03/11/12   now rare  . OSA on CPAP    sleep study 01/2014 - severe, CPAP at 10, previously on nuvigil (Dr. [name])  . Palpitations 01/2012   holter WNL, rare PAC/PVCs  . Presbycusis of both ears 2016   rec amplification [facility])  . Restless leg syndrome    well controlled on mirapex  . Rosacea    [place]  . TIA (transient ischemic attack)   . Type 2 diabetes, controlled, with neuropathy [facility]) 2011   [name] in [facility]    Past Surgical History:  Procedure Laterality Date  . ABI  2007   WNL  . CARDIAC CATHETERIZATION  2004   normal; [place]  . CARDIOVASCULAR STRESS TEST  02/2014   WNL, low risk scan, EF 68%  . CAROTID ENDARTERECTOMY    . CATARACT EXTRACTION W/ INTRAOCULAR LENS IMPLANT  11//2012 - 08/2011  . COLONOSCOPY  03/2011   poor prep, unable to biopsy polyp in tic fold, rpt 3 mo [name])  . COLONOSCOPY  06/2011   mult polyps adenomatous, severe diverticulosis, rpt 3 yrs [name])  . CYSTOSCOPY WITH URETHRAL DILATATION N/A 07/24/2013  Procedure: CYSTOSCOPY WITH URETHRAL DILATATION;  Surgeon: [name], James Bell;  Location: [facility];  Service: Urology;  Laterality: N/A;  . ESOPHAGOGASTRODUODENOSCOPY  2006   duodenitis, medual HH, Grade 2 reflux, mild gastritis  . ESOPHAGOGASTRODUODENOSCOPY  07/2011   mild chronic gastritis  . [place]  . [place]; 2002   left, then repaired  . MRI brain  04/2010   WNL  . PROSTATE SURGERY  02/2009   PVP (laser)  . SKIN CANCER EXCISION  2002   bridge of nose, BCC  . TONSILLECTOMY AND ADENOIDECTOMY  ~ 1945  . TRANSURETHRAL INCISION OF PROSTATE N/A 07/24/2013   Procedure: TRANSURETHRAL INCISION OF THE PROSTATE (TUIP);  Surgeon: [name], James Bell;  Location: [facility];  Service: Urology;  Laterality: N/A;  . URETHRAL DILATION  2014    tannenbaum    There were no vitals filed for this visit.  Subjective Assessment - 04/08/19 0900    Subjective  He got new quad tip cane and switched out upper section with [facility] because he prefers the grip. He is going to [facility] this afternoon to have leather toe cap placed on his shoe & check that AFO plate is trimmed correctly for his shoes. No falls.    Pertinent History  left subcortical infarct on 03/11/2014. CVA, DM2, neuropathy, restless leg syndrome, HTN    Limitations  Lifting;Standing;Walking;House hold activities    Patient Stated Goals  improve strength of right leg. improve walking & balance without falls.     Currently in Pain?  No/denies                       [facility] Adult PT Treatment/Exercise - 04/08/19 0900      Transfers   Transfers  Sit to Stand    Sit to Stand  5: Supervision;With upper extremity assist;With armrests;From chair/3-in-1   chairs with & without armrests using LUE   Stand to Sit  5: Supervision;With upper extremity assist;With armrests;To chair/3-in-1   chairs with & without armrests using LUE     Ambulation/Gait   Ambulation/Gait  Yes    Ambulation/Gait Assistance  4: Min guard;5: Supervision   Min guard with scan, negotiating & grass/gravel   Ambulation/Gait Assistance Details  worked on scanning rt/lt, up/down & diagonals in hall then open gym & ended session outdoors on paved scanning.     Ambulation Distance (Feet)  400 Feet   400' indoors, 200' x2 outdoors   Assistive device  Straight cane;Other (Comment)   his new cane with quad tip & AFO w simulated leather toe cap   Gait Pattern  Step-through pattern;Decreased arm swing - right;Decreased step length - left;Decreased stance time - right;Decreased stride length;Decreased weight shift to right;Narrow base of support    Ambulation Surface  Indoor;Level;Outdoor;Paved;Gravel;Grass    Stairs  Yes    Stairs Assistance  5: Supervision    Stairs Assistance Details (indicate cue  type and reason)  PT demo technique with rail on Rt side since RUE is nonfunctional.     Stair Management Technique  One rail Right;Step to pattern;Sideways   quarter turn sideways with feet offset   Number of Stairs  4    Ramp  5: Supervision   cane quad tip & AFO   Curb  5: Supervision   cane quad tip & AFO   Gait Comments  --      High Level Balance   High Level Balance  Activities  Head turns             PT Education - 04/08/19 0954    Education Details  PT set up appt with [facility] after today's PT session to check AFO plate trim & drop off shoe for leather toe cap. Gave patient info for [employer] for any additional toe caps as less expensive    Person(s) Educated  Patient    Methods  Explanation;Verbal cues    Comprehension  Verbalized understanding       PT Short Term Goals - 03/28/19 1130      PT SHORT TERM GOAL #1   Title  Patient demostrates understanding of updated HEP. (All STGs Target Date: 04/18/2019)    Time  1    Period  Months    Status  On-going    Target Date  04/18/19      PT SHORT TERM GOAL #2   Title  Patient ambulates 300' outdoors on paved surfaces with straight cane with quad tip & AFO with supervision.    Time  1    Period  Months    Status  Revised    Target Date  04/18/19      PT SHORT TERM GOAL #3   Title  Patient negotiates outdoor ramps & curbs with cane with quad tip with supervision.    Time  1    Period  Months    Status  Revised    Target Date  04/18/19      PT SHORT TERM GOAL #4   Title  Timed Up & Go with cane with quad tip <15.5 seconds.    Time  1    Period  Months    Status  Revised    Target Date  04/18/19      PT SHORT TERM GOAL #5   Title  Patient able to maintain path & balance with scanning environment ambulating with cane quad tip with supervision.    Time  1    Period  Months    Status  New    Target Date  04/18/19        PT Long Term Goals - 03/28/19 1130      PT LONG TERM GOAL #1    Title  Patient verbalizes & demonstrates understanding of ongoing HEP / fitness plan. (All LTGs Target Date: 05/16/2019)    Time  3    Period  Months    Status  On-going      PT LONG TERM GOAL #2   Title  Gait Velocity with cane with quad tip comfortable pace >/= 2.1 ft/sec and fast >/= 2.5 ft/sec    Time  3    Period  Months    Status  Revised      PT LONG TERM GOAL #3   Title  Timed Up & Go with cane with quad tip <13.5 seconds to indicate lower fall risk.     Time  3    Period  Months    Status  Revised      PT LONG TERM GOAL #4   Title  Dynamic Gait Index with Cane with quad tip >12/24 to indicate lower fall risk    Time  3    Period  Months    Status  Revised      PT LONG TERM GOAL #5   Title  Patient negotiates ramps & curbs with cane with quad tip modified independent.  Time  3    Period  Months    Status  Revised      PT LONG TERM GOAL #6   Title  Cognitive Timed Up & Go with cane with quad tip <20sec & continues naming activity to indicate lower fall risk when distracted    Time  3    Period  Months    Status  New            Plan - 04/08/19 0956    Clinical Impression Statement  Today's session focused on gait with his new cane with quad tip. He reports feels better & likes that it is lighter than his SBQC.    PT Frequency  2x / week    PT Treatment/Interventions  ADLs/Self Care Home Management;DME Instruction;Gait training;Stair training;Functional mobility training;Therapeutic activities;Therapeutic exercise;Balance training;Neuromuscular re-education;Patient/family education;Orthotic Fit/Training;Vestibular    PT Next Visit Plan  Work towards updated STGs, assess & work with leather toe cap on his shoe & his cane with quad tip    Consulted and Agree with Plan of Care  Patient       Patient will benefit from skilled therapeutic intervention in order to improve the following deficits and impairments:     Visit Diagnosis: 1. Other abnormalities of gait  and mobility   2. Muscle weakness (generalized)   3. Unsteadiness on feet   4. Abnormal posture   5. Repeated falls   6. Paralytic gait        Problem List Patient Active Problem List   Diagnosis Date Noted  . Aphasia as late effect of stroke 03/11/2019  . Dyslipidemia 03/11/2019  . Hx of colonic polyps 03/11/2019  . Diabetic nephropathy ([place]) 03/11/2019  . Hypertensive renal disease 03/11/2019  . Hypothyroidism 03/11/2019  . Iron deficiency anemia due to chronic blood loss 03/11/2019  . Iron deficiency anemia 03/11/2019  . Polyneuropathy due to type 2 diabetes mellitus ([place]) 03/11/2019  . Weight loss 03/11/2019  . GERD without esophagitis 03/11/2019  . Acute respiratory infection 02/26/2019  . Type 2 diabetes mellitus with diabetic nephropathy ([place]) 11/29/2018  . Headache 10/30/2018  . Peripheral neuropathy 01/15/2018  . Left wrist pain 01/15/2018  . Hx of basal cell carcinoma 07/10/2017  . Rosacea   . Gait disturbance, post-stroke 10/05/2015  . Presbycusis of both ears   . Advanced care planning/counseling discussion 06/23/2015  . Impaired mobility 04/23/2015  . HTN (hypertension) 03/18/2015  . Chronic diastolic heart failure, NYHA class 1 ([place]) 03/18/2015  . Major depressive disorder, single episode 01/19/2015  . Benign prostatic hyperplasia with lower urinary tract symptoms 01/19/2015  . Familial multiple lipoprotein-type hyperlipidemia 01/19/2015  . Spastic hemiparesis affecting dominant side ([place]) 07/07/2014  . Spastic hemiplegia affecting dominant side ([place]) 07/07/2014  . Shoulder joint pain 03/16/2014  . Chest pressure 02/03/2014  . Other chest pain 02/03/2014  . Medicare annual wellness visit, subsequent 11/06/2013  . Erectile dysfunction due to arterial insufficiency 02/05/2013  . ED (erectile dysfunction) of organic origin 02/05/2013  . MDD (major depressive disorder), recurrent episode, moderate ([place])   . Palpitations 06/26/2012  . HLD (hyperlipidemia)    . Restless leg syndrome   . GERD (gastroesophageal reflux disease)   . BPH with obstruction/lower urinary tract symptoms   . History of CVA (cerebrovascular accident) 03/11/2012  . Hemiparesis affecting right side as late effect of stroke ([place]) 03/11/2012  . Type 2 diabetes, controlled, with neuropathy ([place]) 03/11/2012  . OSA on CPAP 03/11/2012  . CKD stage 3 secondary  to diabetes ([place]) 03/11/2012  . Migraines 03/11/2012  . Cerebral artery occlusion with cerebral infarction ([place]) 03/11/2012  . Type 2 diabetes mellitus with diabetic neuropathy ([place]) 03/11/2012  . Hemiplegia ([place]) 03/11/2012    Alisen Marsiglia PT, DPT 04/08/2019, 9:58 AM  [facility] [address] [place], [name], [address] Phone: [phone]   Fax:  [phone]  Name: James ZENTNER, James Bell MRN: [id number] Date of Birth: 1930/09/23

## 2019-04-10 ENCOUNTER — Other Ambulatory Visit: Payer: Self-pay

## 2019-04-10 ENCOUNTER — Ambulatory Visit: Payer: Medicare Other | Admitting: Physical Therapy

## 2019-04-10 ENCOUNTER — Encounter: Payer: Self-pay | Admitting: Physical Therapy

## 2019-04-10 DIAGNOSIS — R261 Paralytic gait: Secondary | ICD-10-CM | POA: Diagnosis not present

## 2019-04-10 DIAGNOSIS — R293 Abnormal posture: Secondary | ICD-10-CM

## 2019-04-10 DIAGNOSIS — M6281 Muscle weakness (generalized): Secondary | ICD-10-CM | POA: Diagnosis not present

## 2019-04-10 DIAGNOSIS — R2689 Other abnormalities of gait and mobility: Secondary | ICD-10-CM

## 2019-04-10 DIAGNOSIS — R296 Repeated falls: Secondary | ICD-10-CM

## 2019-04-10 DIAGNOSIS — R2681 Unsteadiness on feet: Secondary | ICD-10-CM

## 2019-04-10 NOTE — Therapy (Signed)
[name], [name], [address] Phone: [phone]   Fax:  [phone]  Physical Therapy Treatment  Patient Details  Name: James TICAS, MD MRN: [id number] Date of Birth: 05-25-1931 Referring Provider (PT): Charlett Blake MD   Encounter Date: 04/10/2019   CLINIC OPERATION CHANGES: [facility] is open at lower capacity following universal masking, social distancing, and patient screening.  The patient's COVID risk of complications score is 7.   PT End of Session - 04/10/19 1450    Visit Number  16    Number of Visits  29    Date for PT Re-Evaluation  05/16/19    Authorization Type  Medicare & T[employer]    Authorization Time Period  [employer] will pick up 100% after Medicare    PT Start Time  1353    PT Stop Time  1440    PT Time Calculation (min)  47 min    Equipment Utilized During Treatment  Gait belt    Activity Tolerance  Patient tolerated treatment well   c/o increased Rt knee instability since fall sustained last week   Behavior During Therapy  WFL for tasks assessed/performed       Past Medical History:  Diagnosis Date  . Anemia   . Basal cell carcinoma 2018   R temple   . Basal cell carcinoma of nose 2002   bridge of nose  . BPH with obstruction/lower urinary tract symptoms    failed flomax, uroxatral, myrbetriq - sees urology [name]) pending videourodynamics  . CKD (chronic kidney disease) stage 3, GFR 30-59 ml/min (HCC) 03/11/2012   Renal US 02/2014 - multiple kidney cysts   . CVA (cerebral infarction) 03/07/12   slurred speech; right hemplegia, left handed - completed PT 07/2014 (17 sessions)  . Depression    mild, on cymbalta per prior PCP  . Erectile dysfunction    per urology (prostaglandin injection)  . GERD (gastroesophageal reflux disease)    with sliding HH  . H/O hiatal hernia   . History of kidney problems 12/2010   lacerated left kidney after  fall  . HLD (hyperlipidemia)   . Hx of basal cell carcinoma 07/10/2017   R. lat. forehead near hair line  . Kidney cysts 02/2014   bilateral (renal US)  . Migraines 03/11/12   now rare  . OSA on CPAP    sleep study 01/2014 - severe, CPAP at 10, previously on nuvigil (Dr. [name])  . Palpitations 01/2012   holter WNL, rare PAC/PVCs  . Presbycusis of both ears 2016   rec amplification [facility])  . Restless leg syndrome    well controlled on mirapex  . Rosacea    [place]  . TIA (transient ischemic attack)   . Type 2 diabetes, controlled, with neuropathy [facility]) 2011   [name] in [facility]    Past Surgical History:  Procedure Laterality Date  . ABI  2007   WNL  . CARDIAC CATHETERIZATION  2004   normal; [place]  . CARDIOVASCULAR STRESS TEST  02/2014   WNL, low risk scan, EF 68%  . CAROTID ENDARTERECTOMY    . CATARACT EXTRACTION W/ INTRAOCULAR LENS IMPLANT  11//2012 - 08/2011  . COLONOSCOPY  03/2011   poor prep, unable to biopsy polyp in tic fold, rpt 3 mo [name])  . COLONOSCOPY  06/2011   mult polyps adenomatous, severe diverticulosis, rpt 3 yrs [name])  . CYSTOSCOPY WITH URETHRAL DILATATION N/A 07/24/2013  Procedure: CYSTOSCOPY WITH URETHRAL DILATATION;  Surgeon: [name], MD;  Location: [facility];  Service: Urology;  Laterality: N/A;  . ESOPHAGOGASTRODUODENOSCOPY  2006   duodenitis, medual HH, Grade 2 reflux, mild gastritis  . ESOPHAGOGASTRODUODENOSCOPY  07/2011   mild chronic gastritis  . [place]  . [place]; 2002   left, then repaired  . MRI brain  04/2010   WNL  . PROSTATE SURGERY  02/2009   PVP (laser)  . SKIN CANCER EXCISION  2002   bridge of nose, BCC  . TONSILLECTOMY AND ADENOIDECTOMY  ~ 1945  . TRANSURETHRAL INCISION OF PROSTATE N/A 07/24/2013   Procedure: TRANSURETHRAL INCISION OF THE PROSTATE (TUIP);  Surgeon: [name], MD;  Location: [facility];  Service: Urology;  Laterality: N/A;  . URETHRAL DILATION  2014    [place]    There were no vitals filed for this visit.  Subjective Assessment - 04/10/19 1355    Subjective  He got shoes with leather toe cap yesterday. So he has used them yesterday afternoon & this morning. He thinks his gait is more fluent with cap.    Pertinent History  left subcortical infarct on 03/11/2014. CVA, DM2, neuropathy, restless leg syndrome, HTN    Limitations  Lifting;Standing;Walking;House hold activities    Patient Stated Goals  improve strength of right leg. improve walking & balance without falls.     Currently in Pain?  Yes    Pain Score  2    2-3/10 without back brace & 0/10 with brace   Pain Location  Back    Pain Orientation  Left;Lower    Pain Descriptors / Indicators  Aching    Pain Type  Chronic pain    Pain Onset  More than a month ago    Pain Frequency  Intermittent    Aggravating Factors   standing too long or walking without back brace    Pain Relieving Factors  wearing back brace    Effect of Pain on Daily Activities  limits standing & gait without brace                       [facility] Adult PT Treatment/Exercise - 04/10/19 1400      Transfers   Transfers  Sit to Stand    Sit to Stand  5: Supervision;With upper extremity assist;With armrests;From chair/3-in-1   chairs with & without armrests using LUE   Stand to Sit  5: Supervision;With upper extremity assist;With armrests;To chair/3-in-1   chairs with & without armrests using LUE     Ambulation/Gait   Ambulation/Gait  Yes    Ambulation/Gait Assistance  5: Supervision   Min guard with scan, negotiating & grass/gravel   Ambulation/Gait Assistance Details  cues on upright posture    Ambulation Distance (Feet)  400 Feet   400' indoors & 400' outdoors on paved   Assistive device  Straight cane;Other (Comment)   his new cane with quad tip & AFO w simulated leather toe cap   Gait Pattern  Step-through pattern;Decreased arm swing - right;Decreased step length - left;Decreased stance  time - right;Decreased stride length;Decreased weight shift to right;Narrow base of support    Ambulation Surface  Indoor;Level;Outdoor;Paved    Stairs  --    Stairs Assistance  --    Stair Management Technique  --    Number of Stairs  --    Ramp  5: Supervision   cane quad tip &  AFO   Ramp Details (indicate cue type and reason)  verbal cues on weight shift and upright posture    Curb  4: Min assist;5: Supervision   cane quad tip & AFO   Curb Details (indicate cue type and reason)  demo & verbal cues on step thru to complete transitions for balance.     Gait Comments  simulated threshold with runner under his door with aerobic step with rolled towel for runner simulation.  Demo, tactile & verbal cues on sequence & technique with LLE stepping on runner & RLE stepping over runner, visualizing RLE placement descending.  8 reps.       High Level Balance   High Level Balance Activities  Head turns             PT Education - 04/10/19 1430    Education Details  [employer] website for 2 different size options & local shoe repair shop adding leather toe cap to right shoe    Person(s) Educated  Patient    Methods  Explanation    Comprehension  Verbalized understanding       PT Short Term Goals - 03/28/19 1130      PT SHORT TERM GOAL #1   Title  Patient demostrates understanding of updated HEP. (All STGs Target Date: 04/18/2019)    Time  1    Period  Months    Status  On-going    Target Date  04/18/19      PT SHORT TERM GOAL #2   Title  Patient ambulates 300' outdoors on paved surfaces with straight cane with quad tip & AFO with supervision.    Time  1    Period  Months    Status  Revised    Target Date  04/18/19      PT SHORT TERM GOAL #3   Title  Patient negotiates outdoor ramps & curbs with cane with quad tip with supervision.    Time  1    Period  Months    Status  Revised    Target Date  04/18/19      PT SHORT TERM GOAL #4   Title  Timed Up & Go with cane with quad tip  <15.5 seconds.    Time  1    Period  Months    Status  Revised    Target Date  04/18/19      PT SHORT TERM GOAL #5   Title  Patient able to maintain path & balance with scanning environment ambulating with cane quad tip with supervision.    Time  1    Period  Months    Status  New    Target Date  04/18/19        PT Long Term Goals - 03/28/19 1130      PT LONG TERM GOAL #1   Title  Patient verbalizes & demonstrates understanding of ongoing HEP / fitness plan. (All LTGs Target Date: 05/16/2019)    Time  3    Period  Months    Status  On-going      PT LONG TERM GOAL #2   Title  Gait Velocity with cane with quad tip comfortable pace >/= 2.1 ft/sec and fast >/= 2.5 ft/sec    Time  3    Period  Months    Status  Revised      PT LONG TERM GOAL #3   Title  Timed Up & Go with cane with quad tip <13.5  seconds to indicate lower fall risk.     Time  3    Period  Months    Status  Revised      PT LONG TERM GOAL #4   Title  Dynamic Gait Index with Cane with quad tip >12/24 to indicate lower fall risk    Time  3    Period  Months    Status  Revised      PT LONG TERM GOAL #5   Title  Patient negotiates ramps & curbs with cane with quad tip modified independent.     Time  3    Period  Months    Status  Revised      PT LONG TERM GOAL #6   Title  Cognitive Timed Up & Go with cane with quad tip <20sec & continues naming activity to indicate lower fall risk when distracted    Time  3    Period  Months    Status  New            Plan - 04/10/19 2145    Clinical Impression Statement  Patient's gait is improved with cane with quad tip, new AFO & leather toe cap on right shoe.    PT Frequency  2x / week    PT Treatment/Interventions  ADLs/Self Care Home Management;DME Instruction;Gait training;Stair training;Functional mobility training;Therapeutic activities;Therapeutic exercise;Balance training;Neuromuscular re-education;Patient/family education;Orthotic Fit/Training;Vestibular     PT Next Visit Plan  Check STGs, gait & balance training    Consulted and Agree with Plan of Care  Patient       Patient will benefit from skilled therapeutic intervention in order to improve the following deficits and impairments:     Visit Diagnosis: 1. Other abnormalities of gait and mobility   2. Muscle weakness (generalized)   3. Unsteadiness on feet   4. Abnormal posture   5. Repeated falls   6. Paralytic gait        Problem List Patient Active Problem List   Diagnosis Date Noted  . Aphasia as late effect of stroke 03/11/2019  . Dyslipidemia 03/11/2019  . Hx of colonic polyps 03/11/2019  . Diabetic nephropathy ([place]) 03/11/2019  . Hypertensive renal disease 03/11/2019  . Hypothyroidism 03/11/2019  . Iron deficiency anemia due to chronic blood loss 03/11/2019  . Iron deficiency anemia 03/11/2019  . Polyneuropathy due to type 2 diabetes mellitus ([place]) 03/11/2019  . Weight loss 03/11/2019  . GERD without esophagitis 03/11/2019  . Acute respiratory infection 02/26/2019  . Type 2 diabetes mellitus with diabetic nephropathy ([place]) 11/29/2018  . Headache 10/30/2018  . Peripheral neuropathy 01/15/2018  . Left wrist pain 01/15/2018  . Hx of basal cell carcinoma 07/10/2017  . Rosacea   . Gait disturbance, post-stroke 10/05/2015  . Presbycusis of both ears   . Advanced care planning/counseling discussion 06/23/2015  . Impaired mobility 04/23/2015  . HTN (hypertension) 03/18/2015  . Chronic diastolic heart failure, NYHA class 1 ([place]) 03/18/2015  . Major depressive disorder, single episode 01/19/2015  . Benign prostatic hyperplasia with lower urinary tract symptoms 01/19/2015  . Familial multiple lipoprotein-type hyperlipidemia 01/19/2015  . Spastic hemiparesis affecting dominant side ([place]) 07/07/2014  . Spastic hemiplegia affecting dominant side ([place]) 07/07/2014  . Shoulder joint pain 03/16/2014  . Chest pressure 02/03/2014  . Other chest pain 02/03/2014  . Medicare  annual wellness visit, subsequent 11/06/2013  . Erectile dysfunction due to arterial insufficiency 02/05/2013  . ED (erectile dysfunction) of organic origin 02/05/2013  . MDD (major depressive  disorder), recurrent episode, moderate ([place])   . Palpitations 06/26/2012  . HLD (hyperlipidemia)   . Restless leg syndrome   . GERD (gastroesophageal reflux disease)   . BPH with obstruction/lower urinary tract symptoms   . History of CVA (cerebrovascular accident) 03/11/2012  . Hemiparesis affecting right side as late effect of stroke ([place]) 03/11/2012  . Type 2 diabetes, controlled, with neuropathy ([place]) 03/11/2012  . OSA on CPAP 03/11/2012  . CKD stage 3 secondary to diabetes ([place]) 03/11/2012  . Migraines 03/11/2012  . Cerebral artery occlusion with cerebral infarction ([place]) 03/11/2012  . Type 2 diabetes mellitus with diabetic neuropathy ([place]) 03/11/2012  . Hemiplegia ([place]) 03/11/2012    Letita Prentiss PT, DPT 04/10/2019, 9:48 PM  [facility] [address] [place], [name], [address] Phone: [phone]   Fax:  [phone]  Name: James LEMBCKE, MD MRN: [id number] Date of Birth: 10-05-1930

## 2019-04-15 ENCOUNTER — Ambulatory Visit: Payer: Medicare Other | Admitting: Physical Therapy

## 2019-04-15 ENCOUNTER — Other Ambulatory Visit: Payer: Self-pay

## 2019-04-15 ENCOUNTER — Encounter: Payer: Self-pay | Admitting: Physical Therapy

## 2019-04-15 DIAGNOSIS — R296 Repeated falls: Secondary | ICD-10-CM | POA: Diagnosis not present

## 2019-04-15 DIAGNOSIS — R2689 Other abnormalities of gait and mobility: Secondary | ICD-10-CM

## 2019-04-15 DIAGNOSIS — M6281 Muscle weakness (generalized): Secondary | ICD-10-CM | POA: Diagnosis not present

## 2019-04-15 DIAGNOSIS — R2681 Unsteadiness on feet: Secondary | ICD-10-CM

## 2019-04-15 DIAGNOSIS — R261 Paralytic gait: Secondary | ICD-10-CM

## 2019-04-15 DIAGNOSIS — R293 Abnormal posture: Secondary | ICD-10-CM

## 2019-04-15 NOTE — Therapy (Signed)
[facility] [address] [place], [name], [address] Phone: [phone]   Fax:  [phone]  Physical Therapy Treatment  Patient Details  Name: James BUDAI, MD MRN: [id number] Date of Birth: November 16, 1930 Referring Provider (PT): Charlett Blake MD   Encounter Date: 04/15/2019   CLINIC OPERATION CHANGES: [facility] is open at lower capacity following universal masking, social distancing, and patient screening.  The patient's COVID risk of complications score is 7.   PT End of Session - 04/15/19 1344    Visit Number  17    Number of Visits  29    Date for PT Re-Evaluation  05/16/19    Authorization Type  Medicare & Tricare    Authorization Time Period  [employer] will pick up 100% after Medicare    PT Start Time  1055    PT Stop Time  1141    PT Time Calculation (min)  46 min    Equipment Utilized During Treatment  Gait belt    Activity Tolerance  Patient tolerated treatment well   c/o increased Rt knee instability since fall sustained last week   Behavior During Therapy  WFL for tasks assessed/performed       Past Medical History:  Diagnosis Date  . Anemia   . Basal cell carcinoma 2018   R temple   . Basal cell carcinoma of nose 2002   bridge of nose  . BPH with obstruction/lower urinary tract symptoms    failed flomax, uroxatral, myrbetriq - sees urology [name]) pending videourodynamics  . CKD (chronic kidney disease) stage 3, GFR 30-59 ml/min (HCC) 03/11/2012   Renal US 02/2014 - multiple kidney cysts   . CVA (cerebral infarction) 03/07/12   slurred speech; right hemplegia, left handed - completed PT 07/2014 (17 sessions)  . Depression    mild, on cymbalta per prior PCP  . Erectile dysfunction    per urology (prostaglandin injection)  . GERD (gastroesophageal reflux disease)    with sliding HH  . H/O hiatal hernia   . History of kidney problems 12/2010   lacerated left kidney after  fall  . HLD (hyperlipidemia)   . Hx of basal cell carcinoma 07/10/2017   R. lat. forehead near hair line  . Kidney cysts 02/2014   bilateral (renal US)  . Migraines 03/11/12   now rare  . OSA on CPAP    sleep study 01/2014 - severe, CPAP at 10, previously on nuvigil (Dr. [name])  . Palpitations 01/2012   holter WNL, rare PAC/PVCs  . Presbycusis of both ears 2016   rec amplification [facility])  . Restless leg syndrome    well controlled on mirapex  . Rosacea    [place]  . TIA (transient ischemic attack)   . Type 2 diabetes, controlled, with neuropathy [facility]) 2011   [name] in [facility]    Past Surgical History:  Procedure Laterality Date  . ABI  2007   WNL  . CARDIAC CATHETERIZATION  2004   normal; [place]  . CARDIOVASCULAR STRESS TEST  02/2014   WNL, low risk scan, EF 68%  . CAROTID ENDARTERECTOMY    . CATARACT EXTRACTION W/ INTRAOCULAR LENS IMPLANT  11//2012 - 08/2011  . COLONOSCOPY  03/2011   poor prep, unable to biopsy polyp in tic fold, rpt 3 mo [name])  . COLONOSCOPY  06/2011   mult polyps adenomatous, severe diverticulosis, rpt 3 yrs [name])  . CYSTOSCOPY WITH URETHRAL DILATATION N/A 07/24/2013  Procedure: CYSTOSCOPY WITH URETHRAL DILATATION;  Surgeon: [name], MD;  Location: [facility];  Service: Urology;  Laterality: N/A;  . ESOPHAGOGASTRODUODENOSCOPY  2006   duodenitis, medual HH, Grade 2 reflux, mild gastritis  . ESOPHAGOGASTRODUODENOSCOPY  07/2011   mild chronic gastritis  . [place]  . [place]; 2002   left, then repaired  . MRI brain  04/2010   WNL  . PROSTATE SURGERY  02/2009   PVP (laser)  . SKIN CANCER EXCISION  2002   bridge of nose, BCC  . TONSILLECTOMY AND ADENOIDECTOMY  ~ 1945  . TRANSURETHRAL INCISION OF PROSTATE N/A 07/24/2013   Procedure: TRANSURETHRAL INCISION OF THE PROSTATE (TUIP);  Surgeon: [name], MD;  Location: [facility];  Service: Urology;  Laterality: N/A;  . URETHRAL DILATION  2014    [place]    There were no vitals filed for this visit.  Subjective Assessment - 04/15/19 1055    Subjective  He called [employer] and they do limited amounts of leather top caps. So he plans to just use [employer]. He is tired today so balance is off some.    Pertinent History  left subcortical infarct on 03/11/2014. CVA, DM2, neuropathy, restless leg syndrome, HTN    Limitations  Lifting;Standing;Walking;House hold activities    Patient Stated Goals  improve strength of right leg. improve walking & balance without falls.     Currently in Pain?  No/denies    Pain Onset  More than a month ago         [facility] PT Assessment - 04/15/19 1100      Timed Up and Go Test   Normal TUG (seconds)  16.28   cane quad tip & AFO                  [facility] Adult PT Treatment/Exercise - 04/15/19 1100      Transfers   Transfers  Sit to Stand    Sit to Stand  5: Supervision;With upper extremity assist;With armrests;From chair/3-in-1   chairs with & without armrests using LUE   Stand to Sit  5: Supervision;With upper extremity assist;With armrests;To chair/3-in-1   chairs with & without armrests using LUE   Comments  10 sit/stand from 24" bar stool without UE assist with minA/tactile cues on balance reaction      Ambulation/Gait   Ambulation/Gait  Yes    Ambulation/Gait Assistance  5: Supervision   Min guard with scan, negotiating & grass/gravel   Ambulation/Gait Assistance Details  tactile & verbal cues on upright posture & balance reactions    Ambulation Distance (Feet)  300 Feet   150' X 2 around gym, 300' exit to his car   Assistive device  Straight cane;Other (Comment)   his new cane with quad tip & AFO w/ leather toe cap   Gait Pattern  Step-through pattern;Decreased arm swing - right;Decreased step length - left;Decreased stance time - right;Decreased stride length;Decreased weight shift to right;Narrow base of support    Ambulation Surface  Indoor;Level    Ramp  4: Min  assist   cane quad tip & AFO   Curb  4: Min assist   cane quad tip & AFO   Gait Comments  simulated threshold with runner under his door with aerobic step with rolled towel for runner simulation.  Demo, tactile & verbal cues on sequence & technique with LLE stepping on runner & RLE stepping over runner, visualizing RLE placement descending.  8 reps.       High Level Balance   High Level Balance Activities  Head turns;Side stepping;Backward walking;Negotiating over obstacles   cane quad tip & AFO   High Level Balance Comments  demo & verbal cues on technique and tactile cues for balance               PT Short Term Goals - 04/15/19 1344      PT SHORT TERM GOAL #1   Title  Patient demostrates understanding of updated HEP. (All STGs Target Date: 04/18/2019)    Baseline  MET 04/15/2019    Time  1    Period  Months    Status  Achieved    Target Date  04/18/19      PT SHORT TERM GOAL #2   Title  Patient ambulates 300' outdoors on paved surfaces with straight cane with quad tip & AFO with supervision.    Baseline  MET 04/15/2019    Time  1    Period  Months    Status  Achieved    Target Date  04/18/19      PT SHORT TERM GOAL #3   Title  Patient negotiates outdoor ramps & curbs with cane with quad tip with supervision.    Baseline  NOT MET 04/15/2019  Patient requires minA to negotiate ramps & curbs safely.    Time  1    Period  Months    Status  Not Met    Target Date  04/18/19      PT SHORT TERM GOAL #4   Title  Timed Up & Go with cane with quad tip <15.5 seconds.    Baseline  Partially MET 04/15/2019  TUG  with cane quad tip 16.28sec (improved but not to goal)    Time  1    Period  Months    Status  Partially Met    Target Date  04/18/19      PT SHORT TERM GOAL #5   Title  Patient able to maintain path & balance with scanning environment ambulating with cane quad tip with supervision.    Baseline  Partially MET 7/28/200 He is inconsistent depending on energy level and  how busy environment is.    Time  1    Period  Months    Status  Partially Met    Target Date  04/18/19        PT Long Term Goals - 03/28/19 1130      PT LONG TERM GOAL #1   Title  Patient verbalizes & demonstrates understanding of ongoing HEP / fitness plan. (All LTGs Target Date: 05/16/2019)    Time  3    Period  Months    Status  On-going      PT LONG TERM GOAL #2   Title  Gait Velocity with cane with quad tip comfortable pace >/= 2.1 ft/sec and fast >/= 2.5 ft/sec    Time  3    Period  Months    Status  Revised      PT LONG TERM GOAL #3   Title  Timed Up & Go with cane with quad tip <13.5 seconds to indicate lower fall risk.     Time  3    Period  Months    Status  Revised      PT LONG TERM GOAL #4   Title  Dynamic Gait Index with Cane with quad tip >12/24 to indicate lower fall risk  Time  3    Period  Months    Status  Revised      PT LONG TERM GOAL #5   Title  Patient negotiates ramps & curbs with cane with quad tip modified independent.     Time  3    Period  Months    Status  Revised      PT LONG TERM GOAL #6   Title  Cognitive Timed Up & Go with cane with quad tip <20sec & continues naming activity to indicate lower fall risk when distracted    Time  3    Period  Months    Status  New            Plan - 04/15/19 1348    Clinical Impression Statement  Today's session focused on checking STGs which he improved towards but did not fully meet them.  He reports fatigued today and balance was not as good as some of previous sessions.    PT Frequency  2x / week    PT Treatment/Interventions  ADLs/Self Care Home Management;DME Instruction;Gait training;Stair training;Functional mobility training;Therapeutic activities;Therapeutic exercise;Balance training;Neuromuscular re-education;Patient/family education;Orthotic Fit/Training;Vestibular    PT Next Visit Plan  work towards [place], gait & balance training    Consulted and Agree with Plan of Care  Patient        Patient will benefit from skilled therapeutic intervention in order to improve the following deficits and impairments:     Visit Diagnosis: 1. Other abnormalities of gait and mobility   2. Muscle weakness (generalized)   3. Unsteadiness on feet   4. Abnormal posture   5. Paralytic gait   6. Unsteadiness        Problem List Patient Active Problem List   Diagnosis Date Noted  . Aphasia as late effect of stroke 03/11/2019  . Dyslipidemia 03/11/2019  . Hx of colonic polyps 03/11/2019  . Diabetic nephropathy ([place]) 03/11/2019  . Hypertensive renal disease 03/11/2019  . Hypothyroidism 03/11/2019  . Iron deficiency anemia due to chronic blood loss 03/11/2019  . Iron deficiency anemia 03/11/2019  . Polyneuropathy due to type 2 diabetes mellitus ([place]) 03/11/2019  . Weight loss 03/11/2019  . GERD without esophagitis 03/11/2019  . Acute respiratory infection 02/26/2019  . Type 2 diabetes mellitus with diabetic nephropathy ([place]) 11/29/2018  . Headache 10/30/2018  . Peripheral neuropathy 01/15/2018  . Left wrist pain 01/15/2018  . Hx of basal cell carcinoma 07/10/2017  . Rosacea   . Gait disturbance, post-stroke 10/05/2015  . Presbycusis of both ears   . Advanced care planning/counseling discussion 06/23/2015  . Impaired mobility 04/23/2015  . HTN (hypertension) 03/18/2015  . Chronic diastolic heart failure, NYHA class 1 ([place]) 03/18/2015  . Major depressive disorder, single episode 01/19/2015  . Benign prostatic hyperplasia with lower urinary tract symptoms 01/19/2015  . Familial multiple lipoprotein-type hyperlipidemia 01/19/2015  . Spastic hemiparesis affecting dominant side ([place]) 07/07/2014  . Spastic hemiplegia affecting dominant side ([place]) 07/07/2014  . Shoulder joint pain 03/16/2014  . Chest pressure 02/03/2014  . Other chest pain 02/03/2014  . Medicare annual wellness visit, subsequent 11/06/2013  . Erectile dysfunction due to arterial insufficiency 02/05/2013  . ED  (erectile dysfunction) of organic origin 02/05/2013  . MDD (major depressive disorder), recurrent episode, moderate ([place])   . Palpitations 06/26/2012  . HLD (hyperlipidemia)   . Restless leg syndrome   . GERD (gastroesophageal reflux disease)   . BPH with obstruction/lower urinary tract symptoms   . History of CVA (  cerebrovascular accident) 03/11/2012  . Hemiparesis affecting right side as late effect of stroke ([place]) 03/11/2012  . Type 2 diabetes, controlled, with neuropathy ([place]) 03/11/2012  . OSA on CPAP 03/11/2012  . CKD stage 3 secondary to diabetes ([place]) 03/11/2012  . Migraines 03/11/2012  . Cerebral artery occlusion with cerebral infarction ([place]) 03/11/2012  . Type 2 diabetes mellitus with diabetic neuropathy ([place]) 03/11/2012  . Hemiplegia ([place]) 03/11/2012    Kaedon Fanelli PT, DPT 04/15/2019, 1:50 PM  [facility] [address] [place], [name], [address] Phone: [phone]   Fax:  [phone]  Name: STIRLING ORTON, MD MRN: [id number] Date of Birth: 04-Apr-1931

## 2019-04-17 ENCOUNTER — Other Ambulatory Visit: Payer: Self-pay

## 2019-04-17 ENCOUNTER — Ambulatory Visit: Payer: Medicare Other | Admitting: Physical Therapy

## 2019-04-17 ENCOUNTER — Encounter: Payer: Self-pay | Admitting: Physical Therapy

## 2019-04-17 DIAGNOSIS — M6281 Muscle weakness (generalized): Secondary | ICD-10-CM | POA: Diagnosis not present

## 2019-04-17 DIAGNOSIS — R2689 Other abnormalities of gait and mobility: Secondary | ICD-10-CM

## 2019-04-17 DIAGNOSIS — R2681 Unsteadiness on feet: Secondary | ICD-10-CM | POA: Diagnosis not present

## 2019-04-17 DIAGNOSIS — R261 Paralytic gait: Secondary | ICD-10-CM | POA: Diagnosis not present

## 2019-04-17 DIAGNOSIS — R293 Abnormal posture: Secondary | ICD-10-CM | POA: Diagnosis not present

## 2019-04-17 DIAGNOSIS — R296 Repeated falls: Secondary | ICD-10-CM | POA: Diagnosis not present

## 2019-04-17 NOTE — Therapy (Signed)
[facility] [address] [place], [name], [address] Phone: [phone]   Fax:  [phone]  Physical Therapy Treatment  Patient Details  Name: James SIEMEN, James Bell MRN: [id number] Date of Birth: 01-18-1931 Referring Provider (PT): Charlett Blake James Bell   Encounter Date: 04/17/2019   CLINIC OPERATION CHANGES: [facility] is open at lower capacity following universal masking, social distancing, and patient screening.  The patient's COVID risk of complications score is 7.   PT End of Session - 04/17/19 0956    Visit Number  18    Number of Visits  29    Date for PT Re-Evaluation  05/16/19    Authorization Type  Medicare & Tricare    Authorization Time Period  [employer] will pick up 100% after Medicare    PT Start Time  0900    PT Stop Time  0945    PT Time Calculation (min)  45 min    Equipment Utilized During Treatment  Gait belt    Activity Tolerance  Patient tolerated treatment well   c/o increased Rt knee instability since fall sustained last week   Behavior During Therapy  WFL for tasks assessed/performed       Past Medical History:  Diagnosis Date  . Anemia   . Basal cell carcinoma 2018   R temple   . Basal cell carcinoma of nose 2002   bridge of nose  . BPH with obstruction/lower urinary tract symptoms    failed flomax, uroxatral, myrbetriq - sees urology [name]) pending videourodynamics  . CKD (chronic kidney disease) stage 3, GFR 30-59 ml/min (HCC) 03/11/2012   Renal US 02/2014 - multiple kidney cysts   . CVA (cerebral infarction) 03/07/12   slurred speech; right hemplegia, left handed - completed PT 07/2014 (17 sessions)  . Depression    mild, on cymbalta per prior PCP  . Erectile dysfunction    per urology (prostaglandin injection)  . GERD (gastroesophageal reflux disease)    with sliding HH  . H/O hiatal hernia   . History of kidney problems 12/2010   lacerated left kidney after  fall  . HLD (hyperlipidemia)   . Hx of basal cell carcinoma 07/10/2017   R. lat. forehead near hair line  . Kidney cysts 02/2014   bilateral (renal US)  . Migraines 03/11/12   now rare  . OSA on CPAP    sleep study 01/2014 - severe, CPAP at 10, previously on nuvigil (Dr. [name])  . Palpitations 01/2012   holter WNL, rare PAC/PVCs  . Presbycusis of both ears 2016   rec amplification [facility])  . Restless leg syndrome    well controlled on mirapex  . Rosacea    [place]  . TIA (transient ischemic attack)   . Type 2 diabetes, controlled, with neuropathy [facility]) 2011   [name] in [facility]    Past Surgical History:  Procedure Laterality Date  . ABI  2007   WNL  . CARDIAC CATHETERIZATION  2004   normal; [place]  . CARDIOVASCULAR STRESS TEST  02/2014   WNL, low risk scan, EF 68%  . CAROTID ENDARTERECTOMY    . CATARACT EXTRACTION W/ INTRAOCULAR LENS IMPLANT  11//2012 - 08/2011  . COLONOSCOPY  03/2011   poor prep, unable to biopsy polyp in tic fold, rpt 3 mo [name])  . COLONOSCOPY  06/2011   mult polyps adenomatous, severe diverticulosis, rpt 3 yrs [name])  . CYSTOSCOPY WITH URETHRAL DILATATION N/A 07/24/2013  Procedure: CYSTOSCOPY WITH URETHRAL DILATATION;  Surgeon: [name], James Bell;  Location: [facility];  Service: Urology;  Laterality: N/A;  . ESOPHAGOGASTRODUODENOSCOPY  2006   duodenitis, medual HH, Grade 2 reflux, mild gastritis  . ESOPHAGOGASTRODUODENOSCOPY  07/2011   mild chronic gastritis  . [place]  . [place]; 2002   left, then repaired  . MRI brain  04/2010   WNL  . PROSTATE SURGERY  02/2009   PVP (laser)  . SKIN CANCER EXCISION  2002   bridge of nose, BCC  . TONSILLECTOMY AND ADENOIDECTOMY  ~ 1945  . TRANSURETHRAL INCISION OF PROSTATE N/A 07/24/2013   Procedure: TRANSURETHRAL INCISION OF THE PROSTATE (TUIP);  Surgeon: [name], James Bell;  Location: [facility];  Service: Urology;  Laterality: N/A;  . URETHRAL DILATION  2014    tannenbaum    There were no vitals filed for this visit.  Subjective Assessment - 04/17/19 0855    Subjective  No falls. He has walked neighborhood with friend with new cane & AFO. They seem to have improved his balance & is more fluent.    Pertinent History  left subcortical infarct on 03/11/2014. CVA, DM2, neuropathy, restless leg syndrome, HTN    Limitations  Lifting;Standing;Walking;House hold activities    Patient Stated Goals  improve strength of right leg. improve walking & balance without falls.     Currently in Pain?  No/denies    Pain Onset  More than a month ago                       [facility] Adult PT Treatment/Exercise - 04/17/19 0900      Transfers   Transfers  Sit to Stand    Sit to Stand  5: Supervision;With upper extremity assist;With armrests;From chair/3-in-1   chairs with & without armrests using LUE   Stand to Sit  5: Supervision;With upper extremity assist;With armrests;To chair/3-in-1   chairs with & without armrests using LUE   Comments  --      Ambulation/Gait   Ambulation/Gait  Yes    Ambulation/Gait Assistance  5: Supervision   Min guard with scan, negotiating & grass/gravel   Ambulation/Gait Assistance Details  work on balance with scanning environment    Ambulation Distance (Feet)  300 Feet   300' X 2   Assistive device  Straight cane;Other (Comment)   his new cane with quad tip & AFO w/ leather toe cap   Gait Pattern  Step-through pattern;Decreased arm swing - right;Decreased step length - left;Decreased stance time - right;Decreased stride length;Decreased weight shift to right;Narrow base of support    Ambulation Surface  Indoor;Level;Outdoor;Paved    Ramp  4: Min assist   cane quad tip & AFO   Curb  4: Min assist   cane quad tip & AFO   Gait Comments  --      High Level Balance   High Level Balance Activities  Head turns;Negotiating over obstacles   cane quad tip & AFO   High Level Balance Comments  demo & verbal cues on  technique and tactile cues for balance      Knee/Hip Exercises: Aerobic   Nustep  Level 3 with BUEs & BLEs for 5 minutes.   PT educated verbally on intensity, increasing time, resting      Knee/Hip Exercises: Machines for Strengthening   Cybex Leg Press  BLEs 60# 15 reps, PT verbally educated on wt amount (40# too  light & 80# too heavy)  building reps / sets slowly to avoid muscle fatigue that could result in fall leaving gym          Balance Exercises - 04/17/19 0900      Balance Exercises: Standing   Standing Eyes Opened  Wide (BOA);Solid surface;Head turns;5 reps   5 reps 4 directions   Standing Eyes Closed  Wide (BOA);Solid surface;10 secs;2 reps        PT Education - 04/17/19 0940    Education Details  returning to [employer] with medical need starting lower times & resistance until build back up    Person(s) Educated  Patient    Methods  Explanation;Demonstration;Tactile cues;Verbal cues    Comprehension  Verbalized understanding;Returned demonstration;Need further instruction       PT Short Term Goals - 04/15/19 1344      PT SHORT TERM GOAL #1   Title  Patient demostrates understanding of updated HEP. (All STGs Target Date: 04/18/2019)    Baseline  MET 04/15/2019    Time  1    Period  Months    Status  Achieved    Target Date  04/18/19      PT SHORT TERM GOAL #2   Title  Patient ambulates 300' outdoors on paved surfaces with straight cane with quad tip & AFO with supervision.    Baseline  MET 04/15/2019    Time  1    Period  Months    Status  Achieved    Target Date  04/18/19      PT SHORT TERM GOAL #3   Title  Patient negotiates outdoor ramps & curbs with cane with quad tip with supervision.    Baseline  NOT MET 04/15/2019  Patient requires minA to negotiate ramps & curbs safely.    Time  1    Period  Months    Status  Not Met    Target Date  04/18/19      PT SHORT TERM GOAL #4   Title  Timed Up & Go with cane with quad tip <15.5 seconds.    Baseline   Partially MET 04/15/2019  TUG  with cane quad tip 16.28sec (improved but not to goal)    Time  1    Period  Months    Status  Partially Met    Target Date  04/18/19      PT SHORT TERM GOAL #5   Title  Patient able to maintain path & balance with scanning environment ambulating with cane quad tip with supervision.    Baseline  Partially MET 7/28/200 He is inconsistent depending on energy level and how busy environment is.    Time  1    Period  Months    Status  Partially Met    Target Date  04/18/19        PT Long Term Goals - 03/28/19 1130      PT LONG TERM GOAL #1   Title  Patient verbalizes & demonstrates understanding of ongoing HEP / fitness plan. (All LTGs Target Date: 05/16/2019)    Time  3    Period  Months    Status  On-going      PT LONG TERM GOAL #2   Title  Gait Velocity with cane with quad tip comfortable pace >/= 2.1 ft/sec and fast >/= 2.5 ft/sec    Time  3    Period  Months    Status  Revised  PT LONG TERM GOAL #3   Title  Timed Up & Go with cane with quad tip <13.5 seconds to indicate lower fall risk.     Time  3    Period  Months    Status  Revised      PT LONG TERM GOAL #4   Title  Dynamic Gait Index with Cane with quad tip >12/24 to indicate lower fall risk    Time  3    Period  Months    Status  Revised      PT LONG TERM GOAL #5   Title  Patient negotiates ramps & curbs with cane with quad tip modified independent.     Time  3    Period  Months    Status  Revised      PT LONG TERM GOAL #6   Title  Cognitive Timed Up & Go with cane with quad tip <20sec & continues naming activity to indicate lower fall risk when distracted    Time  3    Period  Months    Status  New            Plan - 04/17/19 0958    Clinical Impression Statement  Today's session focused on exercises to return to [facility] and safety with level of workout.  He said he felt RLE was tired but did not show issues with gait walking out to his car with PT supervision /  assessment.    PT Frequency  2x / week    PT Treatment/Interventions  ADLs/Self Care Home Management;DME Instruction;Gait training;Stair training;Functional mobility training;Therapeutic activities;Therapeutic exercise;Balance training;Neuromuscular re-education;Patient/family education;Orthotic Fit/Training;Vestibular    PT Next Visit Plan  1x/wk working towards LTGs, gait & balance training    Consulted and Agree with Plan of Care  Patient       Patient will benefit from skilled therapeutic intervention in order to improve the following deficits and impairments:     Visit Diagnosis: 1. Other abnormalities of gait and mobility   2. Muscle weakness (generalized)   3. Unsteadiness on feet   4. Abnormal posture   5. Paralytic gait        Problem List Patient Active Problem List   Diagnosis Date Noted  . Aphasia as late effect of stroke 03/11/2019  . Dyslipidemia 03/11/2019  . Hx of colonic polyps 03/11/2019  . Diabetic nephropathy ([place]) 03/11/2019  . Hypertensive renal disease 03/11/2019  . Hypothyroidism 03/11/2019  . Iron deficiency anemia due to chronic blood loss 03/11/2019  . Iron deficiency anemia 03/11/2019  . Polyneuropathy due to type 2 diabetes mellitus ([place]) 03/11/2019  . Weight loss 03/11/2019  . GERD without esophagitis 03/11/2019  . Acute respiratory infection 02/26/2019  . Type 2 diabetes mellitus with diabetic nephropathy ([place]) 11/29/2018  . Headache 10/30/2018  . Peripheral neuropathy 01/15/2018  . Left wrist pain 01/15/2018  . Hx of basal cell carcinoma 07/10/2017  . Rosacea   . Gait disturbance, post-stroke 10/05/2015  . Presbycusis of both ears   . Advanced care planning/counseling discussion 06/23/2015  . Impaired mobility 04/23/2015  . HTN (hypertension) 03/18/2015  . Chronic diastolic heart failure, NYHA class 1 ([place]) 03/18/2015  . Major depressive disorder, single episode 01/19/2015  . Benign prostatic hyperplasia with lower urinary tract  symptoms 01/19/2015  . Familial multiple lipoprotein-type hyperlipidemia 01/19/2015  . Spastic hemiparesis affecting dominant side ([place]) 07/07/2014  . Spastic hemiplegia affecting dominant side ([place]) 07/07/2014  . Shoulder joint pain 03/16/2014  . Chest  pressure 02/03/2014  . Other chest pain 02/03/2014  . Medicare annual wellness visit, subsequent 11/06/2013  . Erectile dysfunction due to arterial insufficiency 02/05/2013  . ED (erectile dysfunction) of organic origin 02/05/2013  . MDD (major depressive disorder), recurrent episode, moderate ([place])   . Palpitations 06/26/2012  . HLD (hyperlipidemia)   . Restless leg syndrome   . GERD (gastroesophageal reflux disease)   . BPH with obstruction/lower urinary tract symptoms   . History of CVA (cerebrovascular accident) 03/11/2012  . Hemiparesis affecting right side as late effect of stroke ([place]) 03/11/2012  . Type 2 diabetes, controlled, with neuropathy ([place]) 03/11/2012  . OSA on CPAP 03/11/2012  . CKD stage 3 secondary to diabetes ([place]) 03/11/2012  . Migraines 03/11/2012  . Cerebral artery occlusion with cerebral infarction ([place]) 03/11/2012  . Type 2 diabetes mellitus with diabetic neuropathy ([place]) 03/11/2012  . Hemiplegia ([place]) 03/11/2012    Latiesha Harada PT, DPT 04/17/2019, 10:01 AM  [facility] [address] [place], [name], [address] Phone: [phone]   Fax:  [phone]  Name: James BRUCATO, James Bell MRN: [id number] Date of Birth: 11/17/1930

## 2019-04-22 DIAGNOSIS — H40001 Preglaucoma, unspecified, right eye: Secondary | ICD-10-CM | POA: Diagnosis not present

## 2019-04-29 ENCOUNTER — Other Ambulatory Visit: Payer: Self-pay

## 2019-04-29 ENCOUNTER — Ambulatory Visit: Payer: Medicare Other | Attending: Physical Medicine & Rehabilitation | Admitting: Physical Therapy

## 2019-04-29 ENCOUNTER — Encounter: Payer: Self-pay | Admitting: Physical Therapy

## 2019-04-29 DIAGNOSIS — M6281 Muscle weakness (generalized): Secondary | ICD-10-CM | POA: Insufficient documentation

## 2019-04-29 DIAGNOSIS — R261 Paralytic gait: Secondary | ICD-10-CM | POA: Insufficient documentation

## 2019-04-29 DIAGNOSIS — R2689 Other abnormalities of gait and mobility: Secondary | ICD-10-CM | POA: Insufficient documentation

## 2019-04-29 DIAGNOSIS — R293 Abnormal posture: Secondary | ICD-10-CM | POA: Insufficient documentation

## 2019-04-29 DIAGNOSIS — R2681 Unsteadiness on feet: Secondary | ICD-10-CM | POA: Diagnosis not present

## 2019-04-29 NOTE — Patient Instructions (Signed)
Access Code: [email or web address]  URL: [email or web address]  Date: 04/29/2019  Prepared by: Jamey Reas   Exercises Seated Trunk Rotation with Arms Crossed - 3 reps - 1 sets - 3 deep breaths hold - 1x daily - 7x weekly Seated Hamstring Stretch - 3 reps - 1 sets - 3 deep breaths hold - 1x daily - 7x weekly Seated Gastroc Stretch with cane - 3 reps - 1 sets - 3 deep breaths hold - 1x daily - 7x weekly Seated Combined Hamstring / Calf Stretch with cane - 3 reps - 1 sets - 3 deep breaths hold - 1x daily - 7x weekly Standing Lumbar Extension with Counter - 3 reps - 1 sets - 3 deep breaths hold - 1x daily - 7x weekly Standing Isometric Cervical Retraction with Chin Tucks and Ball at [employer] - 10 reps - 1 sets - 3 deep breaths hold - 1x daily - 7x weekly Standing Cervical Retraction - 10 reps - 1 sets - 3 deep breaths hold - 1x daily - 7x weekly

## 2019-04-29 NOTE — Therapy (Signed)
[facility] [address] [place], [name], [address] Phone: [phone]   Fax:  [phone]  Physical Therapy Treatment  Patient Details  Name: James BUSCHE, James Bell MRN: [id number] Date of Birth: 1931-01-31 Referring Provider (PT): Charlett Blake James Bell   Encounter Date: 04/29/2019   CLINIC OPERATION CHANGES: [facility]patient [facility] is open at lower capacity following universal masking, social distancing, and patient screening.  The patient's COVID risk of complications score is 7.   PT End of Session - 04/29/19 0927    Visit Number  19    Number of Visits  29    Date for PT Re-Evaluation  05/16/19    Authorization Type  Medicare & T[employer]    Authorization Time Period  [employer] will pick up 100% after Medicare    PT Start Time  0845    PT Stop Time  0925    PT Time Calculation (min)  40 min    Equipment Utilized During Treatment  Gait belt    Activity Tolerance  Patient tolerated treatment well   c/o increased Rt knee instability since fall sustained last week   Behavior During Therapy  WFL for tasks assessed/performed       Past Medical History:  Diagnosis Date  . Anemia   . Basal cell carcinoma 2018   R temple   . Basal cell carcinoma of nose 2002   bridge of nose  . BPH with obstruction/lower urinary tract symptoms    failed flomax, uroxatral, myrbetriq - sees urology [name]) pending videourodynamics  . CKD (chronic kidney disease) stage 3, GFR 30-59 ml/min (HCC) 03/11/2012   Renal US 02/2014 - multiple kidney cysts   . CVA (cerebral infarction) 03/07/12   slurred speech; right hemplegia, left handed - completed PT 07/2014 (17 sessions)  . Depression    mild, on cymbalta per prior PCP  . Erectile dysfunction    per urology (prostaglandin injection)  . GERD (gastroesophageal reflux disease)    with sliding HH  . H/O hiatal hernia   . History of kidney problems 12/2010   lacerated left kidney after  fall  . HLD (hyperlipidemia)   . Hx of basal cell carcinoma 07/10/2017   R. lat. forehead near hair line  . Kidney cysts 02/2014   bilateral (renal US)  . Migraines 03/11/12   now rare  . OSA on CPAP    sleep study 01/2014 - severe, CPAP at 10, previously on nuvigil (Dr. [name])  . Palpitations 01/2012   holter WNL, rare PAC/PVCs  . Presbycusis of both ears 2016   rec amplification [facility])  . Restless leg syndrome    well controlled on mirapex  . Rosacea    [place]  . TIA (transient ischemic attack)   . Type 2 diabetes, controlled, with neuropathy [facility]) 2011   [name] in [facility]    Past Surgical History:  Procedure Laterality Date  . ABI  2007   WNL  . CARDIAC CATHETERIZATION  2004   normal; [place]  . CARDIOVASCULAR STRESS TEST  02/2014   WNL, low risk scan, EF 68%  . CAROTID ENDARTERECTOMY    . CATARACT EXTRACTION W/ INTRAOCULAR LENS IMPLANT  11//2012 - 08/2011  . COLONOSCOPY  03/2011   poor prep, unable to biopsy polyp in tic fold, rpt 3 mo [name])  . COLONOSCOPY  06/2011   mult polyps adenomatous, severe diverticulosis, rpt 3 yrs [name])  . CYSTOSCOPY WITH URETHRAL DILATATION N/A 07/24/2013  Procedure: CYSTOSCOPY WITH URETHRAL DILATATION;  Surgeon: [name], James Bell;  Location: [facility];  Service: Urology;  Laterality: N/A;  . ESOPHAGOGASTRODUODENOSCOPY  2006   duodenitis, medual HH, Grade 2 reflux, mild gastritis  . ESOPHAGOGASTRODUODENOSCOPY  07/2011   mild chronic gastritis  . [place]  . [place]; 2002   left, then repaired  . MRI brain  04/2010   WNL  . PROSTATE SURGERY  02/2009   PVP (laser)  . SKIN CANCER EXCISION  2002   bridge of nose, BCC  . TONSILLECTOMY AND ADENOIDECTOMY  ~ 1945  . TRANSURETHRAL INCISION OF PROSTATE N/A 07/24/2013   Procedure: TRANSURETHRAL INCISION OF THE PROSTATE (TUIP);  Surgeon: [name], James Bell;  Location: [facility];  Service: Urology;  Laterality: N/A;  . URETHRAL DILATION  2014    tann[place]baum    There were no vitals filed for this visit.  Subjective Assessment - 04/29/19 0844    Subjective  No falls. He is not stubbing his toe now with leather toe cap.    Pertinent History  left subcortical infarct on 03/11/2014. CVA, DM2, neuropathy, restless leg syndrome, HTN    Limitations  Lifting;Standing;Walking;House hold activities    Patient Stated Goals  improve strength of right leg. improve walking & balance without falls.     Currently in Pain?  No/denies    Pain Onset  More than a month ago                       [facility] Adult PT Treatment/Exercise - 04/29/19 0800      Transfers   Transfers  Sit to Stand    Sit to Stand  5: Supervision;With upper extremity assist;With armrests;From chair/3-in-1   chairs with & without armrests using LUE   Stand to Sit  5: Supervision;With upper extremity assist;With armrests;To chair/3-in-1   chairs with & without armrests using LUE     Ambulation/Gait   Ambulation/Gait  Yes    Ambulation/Gait Assistance  5: Supervision   Min guard with scan, negotiating & grass/gravel   Ambulation/Gait Assistance Details  worked on changing pace, scanning & 5' with eyes closed to simulate walking in dark to turn on lights.  Tactile & verbal cues.     Ambulation Distance (Feet)  300 Feet   300' X 2   Assistive device  Straight cane;Other (Comment)   his new cane with quad tip & AFO w/ leather toe cap   Gait Pattern  Step-through pattern;Decreased arm swing - right;Decreased step length - left;Decreased stance time - right;Decreased stride length;Decreased weight shift to right;Narrow base of support    Ambulation Surface  Indoor;Level    Ramp  4: Min assist   cane quad tip & AFO   Curb  4: Min assist   cane quad tip & AFO     High Level Balance   High Level Balance Activities  Head turns;Side stepping;Backward walking   cane quad tip & AFO   High Level Balance Comments  demo & verbal cues on technique and tactile cues for  balance      Knee/Hip Exercises: Aerobic   Nustep  --      Knee/Hip Exercises: Machines for Strengthening   Cybex Leg Press  --             PT Education - 04/29/19 1353    Education Details  updated HEP to include stretches Medbridge [id number],  need to add  flexibility, strength & balance to current program of NuStep & Walking with neighbor.  Recommended performing OTAGO >/= 3 days/wk    Person(s) Educated  Patient    Methods  Explanation;Demonstration;Tactile cues;Verbal cues;Handout    Comprehension  Verbalized understanding;Returned demonstration       PT Short Term Goals - 04/15/19 1344      PT SHORT TERM GOAL #1   Title  Patient demostrates understanding of updated HEP. (All STGs Target Date: 04/18/2019)    Baseline  MET 04/15/2019    Time  1    Period  Months    Status  Achieved    Target Date  04/18/19      PT SHORT TERM GOAL #2   Title  Patient ambulates 300' outdoors on paved surfaces with straight cane with quad tip & AFO with supervision.    Baseline  MET 04/15/2019    Time  1    Period  Months    Status  Achieved    Target Date  04/18/19      PT SHORT TERM GOAL #3   Title  Patient negotiates outdoor ramps & curbs with cane with quad tip with supervision.    Baseline  NOT MET 04/15/2019  Patient requires minA to negotiate ramps & curbs safely.    Time  1    Period  Months    Status  Not Met    Target Date  04/18/19      PT SHORT TERM GOAL #4   Title  Timed Up & Go with cane with quad tip <15.5 seconds.    Baseline  Partially MET 04/15/2019  TUG  with cane quad tip 16.28sec (improved but not to goal)    Time  1    Period  Months    Status  Partially Met    Target Date  04/18/19      PT SHORT TERM GOAL #5   Title  Patient able to maintain path & balance with scanning environment ambulating with cane quad tip with supervision.    Baseline  Partially MET 7/28/200 He is inconsistent depending on energy level and how busy environment is.    Time  1     Period  Months    Status  Partially Met    Target Date  04/18/19        PT Long Term Goals - 03/28/19 1130      PT LONG TERM GOAL #1   Title  Patient verbalizes & demonstrates understanding of ongoing HEP / fitness plan. (All LTGs Target Date: 05/16/2019)    Time  3    Period  Months    Status  On-going      PT LONG TERM GOAL #2   Title  Gait Velocity with cane with quad tip comfortable pace >/= 2.1 ft/sec and fast >/= 2.5 ft/sec    Time  3    Period  Months    Status  Revised      PT LONG TERM GOAL #3   Title  Timed Up & Go with cane with quad tip <13.5 seconds to indicate lower fall risk.     Time  3    Period  Months    Status  Revised      PT LONG TERM GOAL #4   Title  Dynamic Gait Index with Cane with quad tip >12/24 to indicate lower fall risk    Time  3    Period  Months  Status  Revised      PT LONG TERM GOAL #5   Title  Patient negotiates ramps & curbs with cane with quad tip modified independent.     Time  3    Period  Months    Status  Revised      PT LONG TERM GOAL #6   Title  Cognitive Timed Up & Go with cane with quad tip <20sec & continues naming activity to indicate lower fall risk when distracted    Time  3    Period  Months    Status  New            Plan - 04/29/19 1355    Clinical Impression Statement  Today's session focused on updating HEP to include flexibility, strength & balance.  PT also worked on gait with scanning, eyes closed for dark simulation, increasing pace short distances & movement side to side / backwards.    PT Frequency  2x / week    PT Treatment/Interventions  ADLs/Self Care Home Management;DME Instruction;Gait training;Stair training;Functional mobility training;Therapeutic activities;Therapeutic exercise;Balance training;Neuromuscular re-education;Patient/family education;Orthotic Fit/Training;Vestibular    PT Next Visit Plan  1x/wk working towards LTGs, gait & balance training    Consulted and Agree with Plan of Care   Patient       Patient will benefit from skilled therapeutic intervention in order to improve the following deficits and impairments:     Visit Diagnosis: 1. Other abnormalities of gait and mobility   2. Muscle weakness (generalized)   3. Unsteadiness on feet   4. Abnormal posture   5. Paralytic gait        Problem List Patient Active Problem List   Diagnosis Date Noted  . Aphasia as late effect of stroke 03/11/2019  . Dyslipidemia 03/11/2019  . Hx of colonic polyps 03/11/2019  . Diabetic nephropathy ([place]) 03/11/2019  . Hypertensive renal disease 03/11/2019  . Hypothyroidism 03/11/2019  . Iron deficiency anemia due to chronic blood loss 03/11/2019  . Iron deficiency anemia 03/11/2019  . Polyneuropathy due to type 2 diabetes mellitus ([place]) 03/11/2019  . Weight loss 03/11/2019  . GERD without esophagitis 03/11/2019  . Acute respiratory infection 02/26/2019  . Type 2 diabetes mellitus with diabetic nephropathy ([place]) 11/29/2018  . Headache 10/30/2018  . Peripheral neuropathy 01/15/2018  . Left wrist pain 01/15/2018  . Hx of basal cell carcinoma 07/10/2017  . Rosacea   . Gait disturbance, post-stroke 10/05/2015  . Presbycusis of both ears   . Advanced care planning/counseling discussion 06/23/2015  . Impaired mobility 04/23/2015  . HTN (hypertension) 03/18/2015  . Chronic diastolic heart failure, NYHA class 1 ([place]) 03/18/2015  . Major depressive disorder, single episode 01/19/2015  . Benign prostatic hyperplasia with lower urinary tract symptoms 01/19/2015  . Familial multiple lipoprotein-type hyperlipidemia 01/19/2015  . Spastic hemiparesis affecting dominant side ([place]) 07/07/2014  . Spastic hemiplegia affecting dominant side ([place]) 07/07/2014  . Shoulder joint pain 03/16/2014  . Chest pressure 02/03/2014  . Other chest pain 02/03/2014  . Medicare annual wellness visit, subsequent 11/06/2013  . Erectile dysfunction due to arterial insufficiency 02/05/2013  . ED  (erectile dysfunction) of organic origin 02/05/2013  . MDD (major depressive disorder), recurrent episode, moderate ([place])   . Palpitations 06/26/2012  . HLD (hyperlipidemia)   . Restless leg syndrome   . GERD (gastroesophageal reflux disease)   . BPH with obstruction/lower urinary tract symptoms   . History of CVA (cerebrovascular accident) 03/11/2012  . Hemiparesis affecting right side as late  effect of stroke ([place]) 03/11/2012  . Type 2 diabetes, controlled, with neuropathy ([place]) 03/11/2012  . OSA on CPAP 03/11/2012  . CKD stage 3 secondary to diabetes ([place]) 03/11/2012  . Migraines 03/11/2012  . Cerebral artery occlusion with cerebral infarction ([place]) 03/11/2012  . Type 2 diabetes mellitus with diabetic neuropathy ([place]) 03/11/2012  . Hemiplegia ([place]) 03/11/2012    TRUE Shackleford PT, DPT 04/29/2019, 1:57 PM  [facility] [address] [place], [name], [address] Phone: [phone]   Fax:  [phone]  Name: James KLINGEL, James Bell MRN: [id number] Date of Birth: [birth year]

## 2019-05-05 ENCOUNTER — Ambulatory Visit: Payer: Medicare Other | Admitting: Physical Therapy

## 2019-05-06 ENCOUNTER — Ambulatory Visit: Payer: Medicare Other | Admitting: Physical Therapy

## 2019-05-13 ENCOUNTER — Encounter: Payer: Self-pay | Admitting: Physical Therapy

## 2019-05-13 ENCOUNTER — Ambulatory Visit: Payer: Medicare Other | Admitting: Physical Therapy

## 2019-05-13 ENCOUNTER — Other Ambulatory Visit: Payer: Self-pay

## 2019-05-13 DIAGNOSIS — R261 Paralytic gait: Secondary | ICD-10-CM

## 2019-05-13 DIAGNOSIS — R2681 Unsteadiness on feet: Secondary | ICD-10-CM | POA: Diagnosis not present

## 2019-05-13 DIAGNOSIS — R293 Abnormal posture: Secondary | ICD-10-CM

## 2019-05-13 DIAGNOSIS — M6281 Muscle weakness (generalized): Secondary | ICD-10-CM | POA: Diagnosis not present

## 2019-05-13 DIAGNOSIS — R2689 Other abnormalities of gait and mobility: Secondary | ICD-10-CM | POA: Diagnosis not present

## 2019-05-13 NOTE — Therapy (Signed)
[facility] [address] [place], [name], [address] Phone: [phone]   Fax:  [phone]  Physical Therapy Treatment & Discharge Summary  Patient Details  Name: James CWIKLA, James Bell MRN: [id number] Date of Birth: Apr 15, 1931 Referring Provider (PT): Charlett Blake James Bell   Encounter Date: 05/13/2019   CLINIC OPERATION CHANGES: Outpatient Neuro Rehab is open at lower capacity following universal masking, social distancing, and patient screening.  The patient's COVID risk of complications score is 7.  PHYSICAL THERAPY DISCHARGE SUMMARY  Visits from Start of Care: 20  Current functional level related to goals / functional outcomes: See below   Remaining deficits: See below   Education / Equipment: HEP & fall prevention strategies.  Pt received new AFO & had leather toe cap added to shoes.   Plan: Patient agrees to discharge.  Patient goals were met. Patient is being discharged due to meeting the stated rehab goals.  ?????          PT End of Session - 05/13/19 0837    Visit Number  20    Number of Visits  29    Date for PT Re-Evaluation  05/16/19    Authorization Type  Medicare & Tricare    Authorization Time Period  Tricare will pick up 100% after Medicare    PT Start Time  0755    PT Stop Time  0837    PT Time Calculation (min)  42 min    Equipment Utilized During Treatment  Gait belt    Activity Tolerance  Patient tolerated treatment well   c/o increased Rt knee instability since fall sustained last week   Behavior During Therapy  WFL for tasks assessed/performed       Past Medical History:  Diagnosis Date  . Anemia   . Basal cell carcinoma 2018   R temple   . Basal cell carcinoma of nose 2002   bridge of nose  . BPH with obstruction/lower urinary tract symptoms    failed flomax, uroxatral, myrbetriq - sees urology [name]) pending videourodynamics  . CKD (chronic kidney disease)  stage 3, GFR 30-59 ml/min (HCC) 03/11/2012   Renal US 02/2014 - multiple kidney cysts   . CVA (cerebral infarction) 03/07/12   slurred speech; right hemplegia, left handed - completed PT 07/2014 (17 sessions)  . Depression    mild, on cymbalta per prior PCP  . Erectile dysfunction    per urology (prostaglandin injection)  . GERD (gastroesophageal reflux disease)    with sliding HH  . H/O hiatal hernia   . History of kidney problems 12/2010   lacerated left kidney after fall  . HLD (hyperlipidemia)   . Hx of basal cell carcinoma 07/10/2017   R. lat. forehead near hair line  . Kidney cysts 02/2014   bilateral (renal US)  . Migraines 03/11/12   now rare  . OSA on CPAP    sleep study 01/2014 - severe, CPAP at 10, previously on nuvigil (Dr. [name])  . Palpitations 01/2012   holter WNL, rare PAC/PVCs  . Presbycusis of both ears 2016   rec amplification [facility])  . Restless leg syndrome    well controlled on mirapex  . Rosacea    [place]  . TIA (transient ischemic attack)   . Type 2 diabetes, controlled, with neuropathy [facility]) 2011   [name] in [facility]    Past Surgical History:  Procedure Laterality Date  . ABI  2007   WNL  .  CARDIAC CATHETERIZATION  2004   normal; [place]  . CARDIOVASCULAR STRESS TEST  02/2014   WNL, low risk scan, EF 68%  . CAROTID ENDARTERECTOMY    . CATARACT EXTRACTION W/ INTRAOCULAR LENS IMPLANT  11//2012 - 08/2011  . COLONOSCOPY  03/2011   poor prep, unable to biopsy polyp in tic fold, rpt 3 mo [name])  . COLONOSCOPY  06/2011   mult polyps adenomatous, severe diverticulosis, rpt 3 yrs [name])  . CYSTOSCOPY WITH URETHRAL DILATATION N/A 07/24/2013   Procedure: CYSTOSCOPY WITH URETHRAL DILATATION;  Surgeon: [name], James Bell;  Location: [facility];  Service: Urology;  Laterality: N/A;  . ESOPHAGOGASTRODUODENOSCOPY  2006   duodenitis, medual HH, Grade 2 reflux, mild gastritis  . ESOPHAGOGASTRODUODENOSCOPY  07/2011   mild chronic gastritis  . [place]  . [place]; 2002   left, then repaired  . MRI brain  04/2010   WNL  . PROSTATE SURGERY  02/2009   PVP (laser)  . SKIN CANCER EXCISION  2002   bridge of nose, B[facility]  . TONSILLECTOMY AND ADENOIDECTOMY  ~ 1945  . TRANSURETHRAL INCISION OF PROSTATE N/A 07/24/2013   Procedure: TRANSURETHRAL INCISION OF THE PROSTATE (TUIP);  Surgeon: [name], James Bell;  Location: [facility];  Service: Urology;  Laterality: N/A;  . URETHRAL DILATION  2014   [place]    There were no vitals filed for this visit.  Subjective Assessment - 05/13/19 0752    Subjective  He fell. He had ridden his NuStep 30 min & transferred to w/c which was unlocked. He had small laceration to head & tongue.    Pertinent History  left subcortical infarct on 03/11/2014. CVA, DM2, neuropathy, restless leg syndrome, HTN    Limitations  Lifting;Standing;Walking;House hold activities    Patient Stated Goals  improve strength of right leg. improve walking & balance without falls.     Currently in Pain?  No/denies    Pain Onset  More than a month ago         [facility] PT Assessment - 05/13/19 0800      Assessment   Medical Diagnosis  right hemiparesis, gait abnormality    Referring Provider (PT)  Charlett Blake James Bell    Onset Date/Surgical Date  11/07/18    Hand Dominance  Right      Transfers   Transfers  Sit to Stand;Stand to Sit    Sit to Stand  6: Modified independent (Device/Increase time);With upper extremity assist;From chair/3-in-1    Stand to Sit  6: Modified independent (Device/Increase time);With upper extremity assist;To chair/3-in-1      Ambulation/Gait   Ambulation/Gait  Yes    Ambulation/Gait Assistance  6: Modified independent (Device/Increase time)    Ambulation Distance (Feet)  300 Feet    Assistive device  Straight cane;Other (Comment)   AFO   Ambulation Surface  Indoor;Level    Gait velocity  2.27 ft/sec comfortable & 2.55 ft/sec fast pace with cane quad tip   Initial  was 1.60 ft/sec cane comfortable & 2.24 ft/sec fast   Stairs  Yes    Stairs Assistance  6: Modified independent (Device/Increase time)    Stair Management Technique  One rail Left;Alternating pattern;Forwards    Number of Stairs  4    Ramp  6: Modified independent (Device)   cane quad tip & AFO   Curb  6: Modified independent (Device/increase time)   cane quad tip & AFO     Dynamic  Gait Index   Level Surface  Mild Impairment    Change in Gait Speed  Mild Impairment    Gait with Horizontal Head Turns  Mild Impairment    Gait with Vertical Head Turns  Mild Impairment    Gait and Pivot Turn  Mild Impairment    Step Over Obstacle  Moderate Impairment    Step Around Obstacles  Mild Impairment    Steps  Mild Impairment    Total Score  15    DGI comment:  Initial DGI 8/24      Timed Up and Go Test   Normal TUG (seconds)  13.91   cane quad tip & AFO,  initial was 16.28sec   Cognitive TUG (seconds)  16.94   continued cognitive task, initial was 24.81sec                            PT Short Term Goals - 04/15/19 1344      PT SHORT TERM GOAL #1   Title  Patient demostrates understanding of updated HEP. (All STGs Target Date: 04/18/2019)    Baseline  MET 04/15/2019    Time  1    Period  Months    Status  Achieved    Target Date  04/18/19      PT SHORT TERM GOAL #2   Title  Patient ambulates 300' outdoors on paved surfaces with straight cane with quad tip & AFO with supervision.    Baseline  MET 04/15/2019    Time  1    Period  Months    Status  Achieved    Target Date  04/18/19      PT SHORT TERM GOAL #3   Title  Patient negotiates outdoor ramps & curbs with cane with quad tip with supervision.    Baseline  NOT MET 04/15/2019  Patient requires minA to negotiate ramps & curbs safely.    Time  1    Period  Months    Status  Not Met    Target Date  04/18/19      PT SHORT TERM GOAL #4   Title  Timed Up & Go with cane with quad tip <15.5 seconds.    Baseline   Partially MET 04/15/2019  TUG  with cane quad tip 16.28sec (improved but not to goal)    Time  1    Period  Months    Status  Partially Met    Target Date  04/18/19      PT SHORT TERM GOAL #5   Title  Patient able to maintain path & balance with scanning environment ambulating with cane quad tip with supervision.    Baseline  Partially MET 7/28/200 He is inconsistent depending on energy level and how busy environment is.    Time  1    Period  Months    Status  Partially Met    Target Date  04/18/19        PT Long Term Goals - 05/13/19 1306      PT LONG TERM GOAL #1   Title  Patient verbalizes & demonstrates understanding of ongoing HEP / fitness plan. (All LTGs Target Date: 05/16/2019)    Baseline  MET 05/13/2019    Time  3    Period  Months    Status  Achieved      PT LONG TERM GOAL #2   Title  Gait Velocity with cane with quad tip  comfortable pace >/= 2.1 ft/sec and fast >/= 2.5 ft/sec    Baseline  MET 05/13/2019  Gait velocity with cane quad tip comfortable pace 2.27 ft/sec & fast pace 2.55 ft/sec    Time  3    Period  Months    Status  Achieved      PT LONG TERM GOAL #3   Title  Timed Up & Go with cane with quad tip <13.5 seconds to indicate lower fall risk.     Baseline  Partially Met 05/13/2019  TUG with cane quad tip 13.91sec from initial of 16.28sec    Time  3    Period  Months    Status  Achieved      PT LONG TERM GOAL #4   Title  Dynamic Gait Index with Cane with quad tip >12/24 to indicate lower fall risk    Baseline  MET 05/13/2019  DGI 15/25 improved from initial 8/24    Time  3    Period  Months    Status  Achieved      PT LONG TERM GOAL #5   Title  Patient negotiates ramps & curbs with cane with quad tip modified independent.     Baseline  MET 05/13/2019    Time  3    Period  Months    Status  Achieved      PT LONG TERM GOAL #6   Title  Cognitive Timed Up & Go with cane with quad tip <20sec & continues naming activity to indicate lower fall risk when  distracted    Baseline  MET 8/252020  Cognitive TUG 16.94sec from initial 24.81sec    Time  3    Period  Months    Status  Achieved            Plan - 05/13/19 1310    Clinical Impression Statement  Patient met all LTGs indicating lower fall risk.  Patient's recent fall was transferring to w/c and brakes did not hold. He has not fallen while ambulating or performing standing ADLs in last 2 months.  Patient understands PT recommendation to continue well-rounded exercise program & not just his NuStep.    PT Frequency  2x / week    PT Treatment/Interventions  ADLs/Self Care Home Management;DME Instruction;Gait training;Stair training;Functional mobility training;Therapeutic activities;Therapeutic exercise;Balance training;Neuromuscular re-education;Patient/family education;Orthotic Fit/Training;Vestibular    PT Next Visit Plan  1x/wk working towards LTGs, gait & balance training    Consulted and Agree with Plan of Care  Patient       Patient will benefit from skilled therapeutic intervention in order to improve the following deficits and impairments:     Visit Diagnosis: Other abnormalities of gait and mobility  Muscle weakness (generalized)  Unsteadiness on feet  Abnormal posture  Paralytic gait     Problem List Patient Active Problem List   Diagnosis Date Noted  . Aphasia as late effect of stroke 03/11/2019  . Dyslipidemia 03/11/2019  . Hx of colonic polyps 03/11/2019  . Diabetic nephropathy ([place]) 03/11/2019  . Hypertensive renal disease 03/11/2019  . Hypothyroidism 03/11/2019  . Iron deficiency anemia due to chronic blood loss 03/11/2019  . Iron deficiency anemia 03/11/2019  . Polyneuropathy due to type 2 diabetes mellitus ([place]) 03/11/2019  . Weight loss 03/11/2019  . GERD without esophagitis 03/11/2019  . Acute respiratory infection 02/26/2019  . Type 2 diabetes mellitus with diabetic nephropathy ([place]) 11/29/2018  . Headache 10/30/2018  . Peripheral neuropathy  01/15/2018  . Left wrist pain  01/15/2018  . Hx of basal cell carcinoma 07/10/2017  . Rosacea   . Gait disturbance, post-stroke 10/05/2015  . Presbycusis of both ears   . Advanced care planning/counseling discussion 06/23/2015  . Impaired mobility 04/23/2015  . HTN (hypertension) 03/18/2015  . Chronic diastolic heart failure, NYHA class 1 ([place]) 03/18/2015  . Major depressive disorder, single episode 01/19/2015  . Benign prostatic hyperplasia with lower urinary tract symptoms 01/19/2015  . Familial multiple lipoprotein-type hyperlipidemia 01/19/2015  . Spastic hemiparesis affecting dominant side ([place]) 07/07/2014  . Spastic hemiplegia affecting dominant side ([place]) 07/07/2014  . Shoulder joint pain 03/16/2014  . Chest pressure 02/03/2014  . Other chest pain 02/03/2014  . Medicare annual wellness visit, subsequent 11/06/2013  . Erectile dysfunction due to arterial insufficiency 02/05/2013  . ED (erectile dysfunction) of organic origin 02/05/2013  . MDD (major depressive disorder), recurrent episode, moderate ([place])   . Palpitations 06/26/2012  . HLD (hyperlipidemia)   . Restless leg syndrome   . GERD (gastroesophageal reflux disease)   . BPH with obstruction/lower urinary tract symptoms   . History of CVA (cerebrovascular accident) 03/11/2012  . Hemiparesis affecting right side as late effect of stroke ([place]) 03/11/2012  . Type 2 diabetes, controlled, with neuropathy ([place]) 03/11/2012  . OSA on CPAP 03/11/2012  . CKD stage 3 secondary to diabetes ([place]) 03/11/2012  . Migraines 03/11/2012  . Cerebral artery occlusion with cerebral infarction ([place]) 03/11/2012  . Type 2 diabetes mellitus with diabetic neuropathy ([place]) 03/11/2012  . Hemiplegia ([place]) 03/11/2012    Enrika Aguado PT, DPT 05/13/2019, 1:14 PM  [facility] [address] [place], [name], [address] Phone: [phone]   Fax:  [phone]  Name: James CHALFANT,  James Bell MRN: [id number] Date of Birth: 1931-05-22

## 2019-05-19 ENCOUNTER — Ambulatory Visit: Payer: Medicare Other | Admitting: Physical Therapy

## 2019-05-20 DIAGNOSIS — E039 Hypothyroidism, unspecified: Secondary | ICD-10-CM | POA: Diagnosis not present

## 2019-05-20 DIAGNOSIS — E785 Hyperlipidemia, unspecified: Secondary | ICD-10-CM | POA: Diagnosis not present

## 2019-05-20 DIAGNOSIS — E1142 Type 2 diabetes mellitus with diabetic polyneuropathy: Secondary | ICD-10-CM | POA: Diagnosis not present

## 2019-05-20 DIAGNOSIS — Z794 Long term (current) use of insulin: Secondary | ICD-10-CM | POA: Diagnosis not present

## 2019-05-30 ENCOUNTER — Encounter: Payer: Self-pay | Admitting: Family Medicine

## 2019-05-30 ENCOUNTER — Ambulatory Visit (INDEPENDENT_AMBULATORY_CARE_PROVIDER_SITE_OTHER): Payer: Medicare Other | Admitting: Family Medicine

## 2019-05-30 VITALS — BP 150/38 | HR 72 | Temp 97.5°F | Ht 68.0 in | Wt 190.0 lb

## 2019-05-30 DIAGNOSIS — R269 Unspecified abnormalities of gait and mobility: Secondary | ICD-10-CM

## 2019-05-30 DIAGNOSIS — Z7409 Other reduced mobility: Secondary | ICD-10-CM | POA: Diagnosis not present

## 2019-05-30 DIAGNOSIS — G6289 Other specified polyneuropathies: Secondary | ICD-10-CM | POA: Diagnosis not present

## 2019-05-30 DIAGNOSIS — E1140 Type 2 diabetes mellitus with diabetic neuropathy, unspecified: Secondary | ICD-10-CM

## 2019-05-30 DIAGNOSIS — N183 Chronic kidney disease, stage 3 unspecified: Secondary | ICD-10-CM

## 2019-05-30 DIAGNOSIS — Z8673 Personal history of transient ischemic attack (TIA), and cerebral infarction without residual deficits: Secondary | ICD-10-CM | POA: Diagnosis not present

## 2019-05-30 DIAGNOSIS — R05 Cough: Secondary | ICD-10-CM

## 2019-05-30 DIAGNOSIS — I69398 Other sequelae of cerebral infarction: Secondary | ICD-10-CM | POA: Diagnosis not present

## 2019-05-30 DIAGNOSIS — E1142 Type 2 diabetes mellitus with diabetic polyneuropathy: Secondary | ICD-10-CM | POA: Diagnosis not present

## 2019-05-30 DIAGNOSIS — E1122 Type 2 diabetes mellitus with diabetic chronic kidney disease: Secondary | ICD-10-CM | POA: Diagnosis not present

## 2019-05-30 DIAGNOSIS — R058 Other specified cough: Secondary | ICD-10-CM

## 2019-05-30 DIAGNOSIS — I10 Essential (primary) hypertension: Secondary | ICD-10-CM

## 2019-05-30 MED ORDER — ALPHA-LIPOIC ACID 600 MG PO CAPS: 1.0000 | ORAL_CAPSULE | Freq: Two times a day (BID) | ORAL | 11 refills | Status: DC

## 2019-05-30 NOTE — Patient Instructions (Addendum)
If interested, check with pharmacy about new 2 shot shingles series (shingrix).  Try alpha lipoic acid 600mg  twice daily neuropathy supplement.  If beneficial, may be able to decrease neurontin dose.  Transition off afrin - instead restart daily astelin and flonase.  Return as needed or in 4 months for follow up visit.

## 2019-05-30 NOTE — Progress Notes (Signed)
Virtual visit completed through [employer]. Due to national recommendations of social distancing due to COVID-19, a virtual visit is felt to be most appropriate for this patient at this time. Reviewed limitations of a virtual visit.   Patient location: home Provider location: [place] at [facility], office If any vitals were documented, they were collected by patient at home unless specified below.    BP (!) 150/38   Pulse 72   Temp (!) 97.5 F (36.4 C)   Ht 5\' 8"  (1.727 m)   Wt 190 lb (86.2 kg)   SpO2 97%   BMI 28.89 kg/m    CC: 4 mo f/u visit Subjective:    Patient ID: James Spurr, MD, male    DOB: Jun 24, 1931, 85 y.o.   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 male presenting on 05/30/2019 for Follow-up   Asks about flu and shingles shots.   Hasn't taken BP meds yet this morning.   Noticing increasing falls - 3 in last 20 days. Taking 4-5 gabapentin per day - has increased due to worsened neuropathy. Mild dizziness prior to falls. No presyncope/syncope. Has not tried alpha lipoic acid supplementation.   Productive cough has returned. Previously treated with azithromycin and tessalon perls with limited benefit. No fevers/chills, dyspnea or wheezing. He does feel PNDrainage and worsening allergies without ST. Has been using afrin nasal spray, not astelin or flonase. Will transition back onto these and update me in 2 wks with effect on cough.   DM - does regularly check sugars 90-130s fasting. Has not seen [name] recently. Compliant with antihyperglycemic regimen which includes: degludec 70u daily, ozempic 0.5mg  weekly. Denies low sugars or hypoglycemic symptoms. Denies paresthesias. Last diabetic eye exam 10/2018. Pneumovax: 1.2010. Prevnar: 10/2013. Glucometer brand: unsure. DSME: unsure. Lab Results  Component Value Date   HGBA1C 7.1 (H) 01/23/2019   Diabetic Foot Exam - Simple   No data filed     Lab Results  Component Value Date   MICROALBUR 2.0  (H) 04/21/2013         Relevant past medical, surgical, family and social history reviewed and updated as indicated. Interim medical history since our last visit reviewed. Allergies and medications reviewed and updated. Outpatient Medications Prior to Visit  Medication Sig Dispense Refill  . atorvastatin (LIPITOR) 20 MG tablet     . azelastine (ASTELIN) 0.1 % nasal spray Place 1 spray into both nostrils 2 (two) times daily as needed for rhinitis. Use in each nostril as directed 30 mL 6  . capsaicin (ZOSTRIX) 0.025 % cream Apply topically 2 (two) times daily. 60 g 0  . clopidogrel (PLAVIX) 75 MG tablet TAKE 1 TABLET DAILY 90 tablet 3  . Coenzyme Q10 (COQ10) 150 MG CAPS Take 1 capsule by mouth 2 (two) times daily.    . CRESTOR 40 MG tablet TAKE 1 TABLET DAILY 90 tablet 1  . diazepam (VALIUM) 5 MG tablet Take 1 tablet (5 mg total) by mouth every 8 (eight) hours as needed for muscle spasms. 90 tablet 0  . diclofenac sodium (VOLTAREN) 1 % GEL APPLY 2 GRAMS TOPICALLY FOUR TIMES A DAY 300 g 1  . DULoxetine (CYMBALTA) 60 MG capsule TAKE 1 CAPSULE DAILY 90 capsule 3  . FOLBEE 2.5-25-1 MG TABS tablet TAKE 1 TABLET DAILY 90 tablet 1  . gabapentin (NEURONTIN) 300 MG capsule Take 2 capsules (600 mg total) by mouth 2 (two) times daily. 360 capsule 1  . Insulin Degludec (TRESIBA  FLEXTOUCH) 200 UNIT/ML SOPN Inject 60 Units into the skin at bedtime.     . Insulin Pen Needle (NOVOFINE) 30G X 8 MM MISC Use to inject insulin once daily dx:E11.40 300 each 3  . levothyroxine (LEVOTHROID) 25 MCG tablet Take 1 tablet (25 mcg total) by mouth daily before breakfast. Per endo [name]    . metoprolol tartrate (LOPRESSOR) 25 MG tablet Take 0.5 tablets (12.5 mg total) by mouth at bedtime. With AM dose if needed 90 tablet 1  . metroNIDAZOLE (METROCREAM) 0.75 % cream Apply 1 application topically 3 (three) times daily.    . nitroGLYCERIN (NITROSTAT) 0.4 MG SL tablet DISSOLVE 1 TABLET UNDER THE TONGUE EVERY 5 MINUTES AS  NEEDED FOR CHEST PAIN 25 tablet 1  . omega-3 acid ethyl esters (LOVAZA) 1 g capsule Take 2 capsules (2 g total) by mouth 2 (two) times daily. 360 capsule 1  . pramipexole (MIRAPEX) 1 MG tablet TAKE 2 TABLETS AT 3 P.M. AND 2 TABLETS AT BEDTIME AS NEEDED 360 tablet 0  . RABEprazole (ACIPHEX) 20 MG tablet Take 1 tablet (20 mg total) by mouth daily. 90 tablet 1  . Semaglutide (OZEMPIC) 0.25 or 0.5 MG/DOSE SOPN Inject into the skin.    Marland Kitchen traZODone (DESYREL) 50 MG tablet Take 50 mg by mouth at bedtime as needed for sleep.    . valsartan (DIOVAN) 40 MG tablet TAKE 1 TABLET DAILY 90 tablet 3  . vitamin C (ASCORBIC ACID) 500 MG tablet Take 1,000 mg by mouth daily.     Marland Kitchen azithromycin (ZITHROMAX) 250 MG tablet Take two tablets on day one followed by one tablet on days 2-5 6 each 0  . ibuprofen (ADVIL,MOTRIN) 200 MG tablet Take 400 mg by mouth every 6 (six) hours as needed for mild pain or moderate pain.     No facility-administered medications prior to visit.      Per HPI unless specifically indicated in ROS section below Review of Systems Objective:    BP (!) 150/38   Pulse 72   Temp (!) 97.5 F (36.4 C)   Ht 5\' 8"  (1.727 m)   Wt 190 lb (86.2 kg)   SpO2 97%   BMI 28.89 kg/m   Wt Readings from Last 3 Encounters:  05/30/19 190 lb (86.2 kg)  03/11/19 185 lb (83.9 kg)  02/26/19 188 lb (85.3 kg)     Physical exam: Gen: alert, NAD, not ill appearing Pulm: speaks in complete sentences without increased work of breathing Psych: normal mood, normal thought content      Results for orders placed or performed in visit on 03/11/19  Striated muscle antibody  Result Value Ref Range   STRIATED MUSCLE AB SCREEN NEGATIVE NEGATIVE  Acetylcholine receptor, binding  Result Value Ref Range   A CHR BINDING ABS <0.30 nmol/L   Assessment & Plan:   Problem List Items Addressed This Visit    Type 2 diabetes, controlled, with neuropathy ([place])    Encouraged f/u with [name] as overdue.        Recurrent productive cough    Anticipate allergic related given concomitant PNdrainage. Has been using afrin nasal spray, not astelin or flonase. Will transition back onto these and update me in 2 wks with effect on cough.       Polyneuropathy due to type 2 diabetes mellitus ([place])    Has been increasing gabapentin due to worsening neuropathy - this could be contributing to increased imbalance. Suggested trial of alpha lipoic acid supplementation at  600mg  bid in hopes of deceasing gabapentin use.       Peripheral neuropathy    See above.       Impaired mobility - Primary   HTN (hypertension)    BP elevated today - but has not taken antihypertensive yet today. Will take med and monitor BP closely at home.       History of CVA (cerebrovascular accident)   Gait disturbance, post-stroke   CKD stage 3 secondary to diabetes ([place])       Meds ordered this encounter  Medications  . Alpha-Lipoic Acid 600 MG CAPS    Sig: Take 1 capsule (600 mg total) by mouth 2 (two) times daily.    Dispense:  60 capsule    Refill:  11   No orders of the defined types were placed in this encounter.   I discussed the assessment and treatment plan with the patient. The patient was provided an opportunity to ask questions and all were answered. The patient agreed with the plan and demonstrated an understanding of the instructions. The patient was advised to call back or seek an in-person evaluation if the symptoms worsen or if the condition fails to improve as anticipated.  Patient Instructions  If interested, check with pharmacy about new 2 shot shingles series (shingrix).  Try alpha lipoic acid 600mg  twice daily neuropathy supplement.  If beneficial, may be able to decrease neurontin dose.  Transition off afrin - instead restart daily astelin and flonase.  Return as needed or in 4 months for follow up visit.    Follow up plan: Return in about 4 months (around 09/29/2019) for follow up visit.  [name], MD

## 2019-05-30 NOTE — Telephone Encounter (Signed)
Dr. Darnell Level, I think patient is asking about AVS for the visit?

## 2019-05-31 DIAGNOSIS — R058 Other specified cough: Secondary | ICD-10-CM | POA: Insufficient documentation

## 2019-05-31 DIAGNOSIS — R05 Cough: Secondary | ICD-10-CM | POA: Insufficient documentation

## 2019-05-31 NOTE — Assessment & Plan Note (Signed)
See above

## 2019-05-31 NOTE — Assessment & Plan Note (Signed)
BP elevated today - but has not taken antihypertensive yet today. Will take med and monitor BP closely at home.

## 2019-05-31 NOTE — Assessment & Plan Note (Signed)
Anticipate allergic related given concomitant PNdrainage. Has been using afrin nasal spray, not astelin or flonase. Will transition back onto these and update me in 2 wks with effect on cough.

## 2019-05-31 NOTE — Assessment & Plan Note (Signed)
Has been increasing gabapentin due to worsening neuropathy - this could be contributing to increased imbalance. Suggested trial of alpha lipoic acid supplementation at 600mg  bid in hopes of deceasing gabapentin use.

## 2019-05-31 NOTE — Assessment & Plan Note (Signed)
Encouraged f/u with [name] as overdue.

## 2019-06-05 ENCOUNTER — Ambulatory Visit (INDEPENDENT_AMBULATORY_CARE_PROVIDER_SITE_OTHER): Payer: Medicare Other

## 2019-06-05 DIAGNOSIS — Z23 Encounter for immunization: Secondary | ICD-10-CM

## 2019-06-21 ENCOUNTER — Emergency Department: Payer: Medicare Other

## 2019-06-21 ENCOUNTER — Other Ambulatory Visit: Payer: Self-pay

## 2019-06-21 ENCOUNTER — Emergency Department
Admission: EM | Admit: 2019-06-21 | Discharge: 2019-06-22 | Disposition: A | Discharge status: Home / Self Care | Payer: Medicare Other | Attending: Emergency Medicine | Admitting: Emergency Medicine

## 2019-06-21 DIAGNOSIS — I110 Hypertensive heart disease with heart failure: Secondary | ICD-10-CM | POA: Diagnosis not present

## 2019-06-21 DIAGNOSIS — J988 Other specified respiratory disorders: Secondary | ICD-10-CM

## 2019-06-21 DIAGNOSIS — B349 Viral infection, unspecified: Secondary | ICD-10-CM | POA: Insufficient documentation

## 2019-06-21 DIAGNOSIS — J069 Acute upper respiratory infection, unspecified: Secondary | ICD-10-CM | POA: Diagnosis not present

## 2019-06-21 DIAGNOSIS — Z85828 Personal history of other malignant neoplasm of skin: Secondary | ICD-10-CM | POA: Diagnosis not present

## 2019-06-21 DIAGNOSIS — Z8673 Personal history of transient ischemic attack (TIA), and cerebral infarction without residual deficits: Secondary | ICD-10-CM | POA: Insufficient documentation

## 2019-06-21 DIAGNOSIS — I5032 Chronic diastolic (congestive) heart failure: Secondary | ICD-10-CM | POA: Insufficient documentation

## 2019-06-21 DIAGNOSIS — E039 Hypothyroidism, unspecified: Secondary | ICD-10-CM | POA: Insufficient documentation

## 2019-06-21 DIAGNOSIS — Z79899 Other long term (current) drug therapy: Secondary | ICD-10-CM | POA: Diagnosis not present

## 2019-06-21 DIAGNOSIS — B9789 Other viral agents as the cause of diseases classified elsewhere: Secondary | ICD-10-CM

## 2019-06-21 DIAGNOSIS — Z794 Long term (current) use of insulin: Secondary | ICD-10-CM | POA: Diagnosis not present

## 2019-06-21 DIAGNOSIS — Z20828 Contact with and (suspected) exposure to other viral communicable diseases: Secondary | ICD-10-CM | POA: Diagnosis not present

## 2019-06-21 DIAGNOSIS — I10 Essential (primary) hypertension: Secondary | ICD-10-CM | POA: Diagnosis not present

## 2019-06-21 DIAGNOSIS — E119 Type 2 diabetes mellitus without complications: Secondary | ICD-10-CM | POA: Insufficient documentation

## 2019-06-21 DIAGNOSIS — R05 Cough: Secondary | ICD-10-CM | POA: Diagnosis not present

## 2019-06-21 DIAGNOSIS — Z20822 Contact with and (suspected) exposure to covid-19: Secondary | ICD-10-CM

## 2019-06-21 LAB — CBC
HCT: 39.4 % (ref 39.0–52.0)
Hemoglobin: 13.2 g/dL (ref 13.0–17.0)
MCH: 29.9 pg (ref 26.0–34.0)
MCHC: 33.5 g/dL (ref 30.0–36.0)
MCV: 89.3 fL (ref 80.0–100.0)
Platelets: 185 10*3/uL (ref 150–400)
RBC: 4.41 MIL/uL (ref 4.22–5.81)
RDW: 13.2 % (ref 11.5–15.5)
WBC: 7.6 10*3/uL (ref 4.0–10.5)
nRBC: 0 % (ref 0.0–0.2)

## 2019-06-21 LAB — COMPREHENSIVE METABOLIC PANEL
ALT: 17 U/L (ref 0–44)
AST: 20 U/L (ref 15–41)
Albumin: 3.6 g/dL (ref 3.5–5.0)
Alkaline Phosphatase: 47 U/L (ref 38–126)
Anion gap: 6 (ref 5–15)
BUN: 32 mg/dL — ABNORMAL HIGH (ref 8–23)
CO2: 24 mmol/L (ref 22–32)
Calcium: 8.7 mg/dL — ABNORMAL LOW (ref 8.9–10.3)
Chloride: 105 mmol/L (ref 98–111)
Creatinine, Ser: 1.31 mg/dL — ABNORMAL HIGH (ref 0.61–1.24)
GFR calc Af Amer: 57 mL/min — ABNORMAL LOW (ref >60)
GFR calc non Af Amer: 49 mL/min — ABNORMAL LOW (ref >60)
Glucose, Bld: 208 mg/dL — ABNORMAL HIGH (ref 70–99)
Potassium: 4 mmol/L (ref 3.5–5.1)
Sodium: 135 mmol/L (ref 135–145)
Total Bilirubin: 0.7 mg/dL (ref 0.3–1.2)
Total Protein: 7 g/dL (ref 6.5–8.1)

## 2019-06-21 LAB — TROPONIN I (HIGH SENSITIVITY): Troponin I (High Sensitivity): 3 ng/L (ref <18)

## 2019-06-21 MED ORDER — SODIUM CHLORIDE 0.9 % IV BOLUS
500.0000 mL | Freq: Once | INTRAVENOUS | Status: AC
  Administered 2019-06-21: 500 mL via INTRAVENOUS

## 2019-06-21 NOTE — ED Notes (Signed)
Patient gives this RN verbal permission for registration to call and let his daughter verify insurance information.

## 2019-06-21 NOTE — ED Provider Notes (Signed)
-----------------------------------------   11:19 PM on 06/21/2019 -----------------------------------------  Assuming care from Dr. Joan Mayans.  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  Refer to the original H&P for additional details.  The current plan of care is to follow up UA and discharge on antibiotics if indicated.  Patient understands concern for COVID-19 and the nature of the send-out test.   ----------------------------------------- 12:33 AM on 06/22/2019 -----------------------------------------  Urinalysis shows a few rare bacteria but without any other signs suggestive of a UTI.  Given that he has no other signs or symptoms of UTI and that his symptoms are more likely due to a respiratory virus and quite possibly COVID-19, I will not treat empirically with antibiotics.  A urine culture is pending and antibiotics can be called in for him if it turns up positive for suspected urinary pathogen.  The patient was given the discharge information as per the plan by Dr. Joan Mayans and will follow-up as an outpatient with the usual return precautions.   Hinda Kehr, MD 06/22/19 [phone]

## 2019-06-21 NOTE — ED Notes (Signed)
Resting quietly with eyes closed, no acute distress noted. °

## 2019-06-21 NOTE — ED Triage Notes (Signed)
Pt states two days of "not feeling well". Pt states he feels weak, nauseated, has a cough with white sputum production, "my taste is off" and has a sore throat. Pt is a poor historian, difficult to obtain information.

## 2019-06-21 NOTE — ED Notes (Signed)
Patient up to restroom.

## 2019-06-22 DIAGNOSIS — B349 Viral infection, unspecified: Secondary | ICD-10-CM | POA: Diagnosis not present

## 2019-06-22 LAB — URINALYSIS, COMPLETE (UACMP) WITH MICROSCOPIC
Bilirubin Urine: NEGATIVE
Glucose, UA: 50 mg/dL — AB
Hgb urine dipstick: NEGATIVE
Ketones, ur: NEGATIVE mg/dL
Leukocytes,Ua: NEGATIVE
Nitrite: NEGATIVE
Protein, ur: NEGATIVE mg/dL
Specific Gravity, Urine: 1.006 (ref 1.005–1.030)
pH: 5 (ref 5.0–8.0)

## 2019-06-22 NOTE — ED Provider Notes (Signed)
[facility] Emergency Department Provider Note  ____________________________________________   First MD Initiated Contact with Patient 06/21/19 2318     (approximate)  I have reviewed the triage vital signs and the nursing notes.  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 He also reports changes to smell/taste. He denies any sick contacts. No fevers, chills, difficulty breathing, chest pain, vomiting, diarrhea.    Past Medical Hx Past Medical History:  Diagnosis Date  . Anemia   . Basal cell carcinoma 2018   R temple   . Basal cell carcinoma of nose 2002   bridge of nose  . BPH with obstruction/lower urinary tract symptoms    failed flomax, uroxatral, myrbetriq - sees urology [name]) pending videourodynamics  . CKD (chronic kidney disease) stage 3, GFR 30-59 ml/min (HCC) 03/11/2012   Renal US 02/2014 - multiple kidney cysts   . CVA (cerebral infarction) 03/07/12   slurred speech; right hemplegia, left handed - completed PT 07/2014 (17 sessions)  . Depression    mild, on cymbalta per prior PCP  . Erectile dysfunction    per urology (prostaglandin injection)  . GERD (gastroesophageal reflux disease)    with sliding HH  . H/O hiatal hernia   . History of kidney problems 12/2010   lacerated left kidney after fall  . HLD (hyperlipidemia)   . Hx of basal cell carcinoma 07/10/2017   R. lat. forehead near hair line  . Kidney cysts 02/2014   bilateral (renal US)  . Migraines 03/11/12   now rare  . OSA on CPAP    sleep study 01/2014 - severe, CPAP at 10, previously on nuvigil (Dr. [name])  . Palpitations 01/2012   holter WNL, rare PAC/PVCs  . Presbycusis of both ears 2016   rec amplification [facility])  . Restless leg syndrome    well controlled on mirapex  . Rosacea    [place]  . TIA (transient  ischemic attack)   . Type 2 diabetes, controlled, with neuropathy [facility]) 2011   [name] in [facility]    Problem List Patient Active Problem List   Diagnosis Date Noted  . Recurrent productive cough 05/31/2019  . Aphasia as late effect of stroke 03/11/2019  . Hx of colonic polyps 03/11/2019  . Hypertensive renal disease 03/11/2019  . Hypothyroidism 03/11/2019  . Iron deficiency anemia due to chronic blood loss 03/11/2019  . Polyneuropathy due to type 2 diabetes mellitus ([place]) 03/11/2019  . Weight loss 03/11/2019  . Acute respiratory infection 02/26/2019  . Headache 10/30/2018  . Peripheral neuropathy 01/15/2018  . Left wrist pain 01/15/2018  . Hx of basal cell carcinoma 07/10/2017  . Rosacea   . Gait disturbance, post-stroke 10/05/2015  . Presbycusis of both ears   . Advanced care planning/counseling discussion 06/23/2015  . Impaired mobility 04/23/2015  . HTN (hypertension) 03/18/2015  . Chronic diastolic heart failure, NYHA class 1 ([place]) 03/18/2015  . Familial multiple lipoprotein-type hyperlipidemia 01/19/2015  . Spastic hemiparesis affecting dominant side ([place]) 07/07/2014  . Spastic hemiplegia affecting dominant side ([place]) 07/07/2014  . Shoulder joint pain 03/16/2014  . Chest pressure 02/03/2014  . Other chest pain 02/03/2014  . Medicare annual wellness visit, subsequent 11/06/2013  . Erectile dysfunction due to arterial insufficiency 02/05/2013  . MDD (major depressive disorder), recurrent episode, moderate ([place])   . Palpitations 06/26/2012  .  HLD (hyperlipidemia)   . Restless leg syndrome   . GERD (gastroesophageal reflux disease)   . BPH with obstruction/lower urinary tract symptoms   . History of CVA (cerebrovascular accident) 03/11/2012  . Hemiparesis affecting right side as late effect of stroke ([place]) 03/11/2012  . Type 2 diabetes, controlled, with neuropathy ([place]) 03/11/2012  . OSA on CPAP 03/11/2012  . CKD stage 3 secondary to diabetes ([place]) 03/11/2012  . Migraines  03/11/2012  . Cerebral artery occlusion with cerebral infarction ([place]) 03/11/2012    Past Surgical Hx Past Surgical History:  Procedure Laterality Date  . ABI  2007   WNL  . CARDIAC CATHETERIZATION  2004   normal; [place]  . CARDIOVASCULAR STRESS TEST  02/2014   WNL, low risk scan, EF 68%  . CAROTID ENDARTERECTOMY    . CATARACT EXTRACTION W/ INTRAOCULAR LENS IMPLANT  11//2012 - 08/2011  . COLONOSCOPY  03/2011   poor prep, unable to biopsy polyp in tic fold, rpt 3 mo [name])  . COLONOSCOPY  06/2011   mult polyps adenomatous, severe diverticulosis, rpt 3 yrs [name])  . CYSTOSCOPY WITH URETHRAL DILATATION N/A 07/24/2013   Procedure: CYSTOSCOPY WITH URETHRAL DILATATION;  Surgeon: [name], MD;  Location: [facility]S;  Service: Urology;  Laterality: N/A;  . ESOPHAGOGASTRODUODENOSCOPY  2006   duodenitis, medual HH, Grade 2 reflux, mild gastritis  . ESOPHAGOGASTRODUODENOSCOPY  07/2011   mild chronic gastritis  . [place]  . [place]; 2002   left, then repaired  . MRI brain  04/2010   WNL  . PROSTATE SURGERY  02/2009   PVP (laser)  . SKIN CANCER EXCISION  2002   bridge of nose, BCC  . TONSILLECTOMY AND ADENOIDECTOMY  ~ 1945  . TRANSURETHRAL INCISION OF PROSTATE N/A 07/24/2013   Procedure: TRANSURETHRAL INCISION OF THE PROSTATE (TUIP);  Surgeon: [name], MD;  Location: [facility];  Service: Urology;  Laterality: N/A;  . URETHRAL DILATION  2014   [place]    Medications Prior to Admission medications   Medication Sig Start Date End Date Taking? Authorizing Provider  Alpha-Lipoic Acid 600 MG CAPS Take 1 capsule (600 mg total) by mouth 2 (two) times daily. 05/30/19   [name], MD  atorvastatin (LIPITOR) 20 MG tablet  03/06/19   [provider]  azelastine (ASTELIN) 0.1 % nasal spray Place 1 spray into both nostrils 2 (two) times daily as needed for rhinitis. Use in each nostril as directed 02/24/19   [name],  MD  capsaicin (ZOSTRIX) 0.025 % cream Apply topically 2 (two) times daily. 01/15/18   [name], MD  clopidogrel (PLAVIX) 75 MG tablet TAKE 1 TABLET DAILY 01/14/19   [name], MD  Coenzyme Q10 (COQ10) 150 MG CAPS Take 1 capsule by mouth 2 (two) times daily.    [provider]  CRESTOR 40 MG tablet TAKE 1 TABLET DAILY 09/12/17   [name], MD  diazepam (VALIUM) 5 MG tablet Take 1 tablet (5 mg total) by mouth every 8 (eight) hours as needed for muscle spasms. 03/27/17   [name], MD  diclofenac sodium (VOLTAREN) 1 % GEL APPLY 2 GRAMS TOPICALLY FOUR TIMES A DAY 01/28/18   Kirsteins, Luanna Salk, MD  DULoxetine (CYMBALTA) 60 MG capsule TAKE 1 CAPSULE DAILY 01/09/19   [name], MD  FOLBEE 2.5-25-1 MG TABS tablet TAKE 1 TABLET DAILY 11/23/16   [name], MD  gabapentin (NEURONTIN) 300 MG capsule Take 2 capsules (600 mg  total) by mouth 2 (two) times daily. 03/11/19   Pieter Partridge, DO  Insulin Degludec (TRESIBA FLEXTOUCH) 200 UNIT/ML SOPN Inject 60 Units into the skin at bedtime.     [provider]  Insulin Pen Needle (NOVOFINE) 30G X 8 MM MISC Use to inject insulin once daily dx:E11.40 02/26/19   [name], MD  levothyroxine (LEVOTHROID) 25 MCG tablet Take 1 tablet (25 mcg total) by mouth daily before breakfast. Per endo [name] 06/23/15   [name], MD  metoprolol tartrate (LOPRESSOR) 25 MG tablet Take 0.5 tablets (12.5 mg total) by mouth at bedtime. With AM dose if needed 01/14/19   [name], MD  metroNIDAZOLE (METROCREAM) 0.75 % cream Apply 1 application topically 3 (three) times daily.    [provider]  nitroGLYCERIN (NITROSTAT) 0.4 MG SL tablet DISSOLVE 1 TABLET UNDER THE TONGUE EVERY 5 MINUTES AS NEEDED FOR CHEST PAIN 07/28/16   [name], MD  omega-3 acid ethyl esters (LOVAZA) 1 g capsule Take 2 capsules (2 g total) by mouth 2 (two) times daily. 06/28/16   [name], MD  pramipexole (MIRAPEX)  1 MG tablet TAKE 2 TABLETS AT 3 P.M. AND 2 TABLETS AT BEDTIME AS NEEDED 12/24/18   [name], MD  RABEprazole (ACIPHEX) 20 MG tablet Take 1 tablet (20 mg total) by mouth daily. 02/03/19   [name], MD  Semaglutide [facility]) 0.25 or 0.5 MG/DOSE SOPN Inject into the skin.    [provider]  traZODone (DESYREL) 50 MG tablet Take 50 mg by mouth at bedtime as needed for sleep.    [provider]  valsartan (DIOVAN) 40 MG tablet TAKE 1 TABLET DAILY 02/24/19   [name], MD  vitamin C (ASCORBIC ACID) 500 MG tablet Take 1,000 mg by mouth daily.     [provider]    Allergies Lisinopril, Nexium [esomeprazole], and Niacin and related  Family Hx Family History  Problem Relation Age of Onset  . Stroke Father   . Lung cancer Father 76        smoker  . Stroke Brother   . Diabetes Brother 35  . Lung cancer Brother   . Alcoholism Sister   . Kidney disease Sister   . Coronary artery disease Neg Hx     Social Hx Social History   Tobacco Use  . Smoking status: Never Smoker  . Smokeless tobacco: Never Used  . Tobacco comment: "used to smoke pipe when I was younger"  Substance Use Topics  . Alcohol use: Yes    Alcohol/week: 4.0 standard drinks    Types: 2 Glasses of wine, 2 Shots of liquor per week    Comment: occasional  . Drug use: No     Review of Systems  Constitutional: Negative for fever, chills. + malaise, decreased appetite Eyes: Negative for visual changes. ENT: Negative for sore throat. Cardiovascular: Negative for chest pain. Respiratory: Negative for shortness of breath. + cough Gastrointestinal: Negative for nausea, vomiting.  Genitourinary: Negative for dysuria. Musculoskeletal: Negative for leg swelling. Skin: Negative for rash. Neurological: Negative for for headaches.   Physical Exam  Vital Signs: ED Triage Vitals  Enc Vitals Group     BP 06/21/19 1904 (!) 128/57     Pulse Rate 06/21/19 1904 66     Resp  06/21/19 1904 18     Temp 06/21/19 1904 (!) 97.5 F (36.4 C)     Temp Source 06/21/19 1904 Oral     SpO2 06/21/19 1904 100 %  Weight 06/21/19 1903 190 lb (86.2 kg)     Height 06/21/19 1903 5\' 8"  (1.727 m)     Head Circumference --      Peak Flow --      Pain Score 06/21/19 1904 0     Pain Loc --      Pain Edu? --      Excl. in [place]? --     Constitutional: Alert and oriented.  Head: Normocephalic. Atraumatic. Eyes: Conjunctivae clear. Sclera anicteric. Nose: No congestion. No rhinorrhea. Mouth/Throat: Mucous membranes are moist.  Neck: No stridor.   Cardiovascular: Normal rate, regular rhythm. No murmurs. Extremities well perfused. Respiratory: Normal respiratory effort.  Lungs CTAB. No wheezing. Gastrointestinal: Soft. Non-tender. Non-distended.  Musculoskeletal: No lower extremity edema. No deformities. Neurologic:  Normal speech and language. No gross focal neurologic deficits are appreciated.  Skin: Skin is warm, dry and intact. No rash noted. Psychiatric: Mood and affect are appropriate for situation.  EKG  Personally reviewed.   Rate: within normal limits Rhythm: sinus Axis: LAD Intervals: slightly wide QRS, 108 ms, appears unchanged from prior No STEMI    Radiology  CXR: IMPRESSION:  No active cardiopulmonary disease.    Procedures  Procedure(s) performed (including critical care):  Procedures   Initial Impression / Assessment and Plan / ED Course  85 y.o. male who presents to the ED for generalized malaise, fatigue, cough, decreased appetite, as above.   Presentation concerning for possible COVID, other viral process, electrolyte abnormality, urine infection.   Will obtain labs, EKG, imaging.   Labs w/o actionable derangements. Cr near baseline. XR negative. HS troponin negative. COVID swab sent, patient understands results will take 1-2 days and the need for quarantine, social distancing, etc until his lab results. Awaiting urine, anticipate  discharge.     Final Clinical Impression(s) / ED Diagnosis  Final diagnoses:  Viral respiratory infection  Suspected COVID-19 virus infection       Note:  This document was prepared using Dragon voice recognition software and may include unintentional dictation errors.   Lilia Pro., MD 06/22/19 [phone]

## 2019-06-22 NOTE — Discharge Instructions (Addendum)
As we discussed, we believe your symptoms are caused by a respiratory virus.  However, because we cannot rule out the possibility of COVID-19 at this time, we recommend that you self-quarantine at home for 14 days, or until 3 consecutive days without fever (without taking medication to make your temperature come down, such as Tylenol (acetaminophen), after your respiratory symptoms have improved, and after at least 7 days have passed since your symptoms first appeared.  You should have as minimal contact as possible with anyone else including close family as per the [facility] paperwork guidelines listed below. Follow-up with your doctor by phone or online as needed and return immediately to the emergency department or call 911 only if you develop new or worsening symptoms that concern you.  Please read through the included information in this paperwork for information about how to set up a MyChart account so that you can log in over the next couple of days for your test results (including COVID-19 swab results if one was obtained during your Emergency Department visit).  You can find up-to-date information about COVID-19 in [place] by calling the [place]: [phone]. You may also call 2-1-1, or [phone], or additional resources.  You can also find information online at [email or web address]-[email or web address] or on the Center for Disease Control (CDC) website at BeginnerSteps.be.

## 2019-06-23 ENCOUNTER — Telehealth: Payer: Self-pay

## 2019-06-23 LAB — NOVEL CORONAVIRUS, NAA (HOSP ORDER, SEND-OUT TO REF LAB; TAT 18-24 HRS): SARS-CoV-2, NAA: NOT DETECTED

## 2019-06-23 LAB — URINE CULTURE

## 2019-06-23 NOTE — Telephone Encounter (Signed)
Would offer virtual visit given seen for cough. Thanks.

## 2019-06-23 NOTE — Telephone Encounter (Signed)
Dr Darnell Level, Patient was at ER on 06/22/2019 and has been sent home per records but his COVID test is still pending. Per D/C notes patient to follow up with PCP. Do you want me to go ahead and call patient to set him up or wait till test result comes back? If it does come back negative do you want patient to do virtual visit or in office?

## 2019-06-23 NOTE — Telephone Encounter (Signed)
COVID result is negative. Left message for patient to call back to schedule Virtual office visit for f/u on ER visit with Dr Darnell Level.

## 2019-06-24 NOTE — Telephone Encounter (Signed)
Left message for patient to call back to schedule virtual office visit for ER f/u with Dr Darnell Level.

## 2019-06-25 NOTE — Telephone Encounter (Addendum)
Left message for patient to call back to schedule virtual office visit for ER f/u with Dr Darnell Level.  I also sent message via [place].

## 2019-06-27 NOTE — Telephone Encounter (Signed)
Have made multiple attempts to reach patient without success through phone calls and mychart

## 2019-07-07 ENCOUNTER — Other Ambulatory Visit: Payer: Self-pay | Admitting: Family Medicine

## 2019-07-11 ENCOUNTER — Telehealth: Payer: Self-pay | Admitting: Family Medicine

## 2019-07-11 MED ORDER — GUAIFENESIN-CODEINE 100-10 MG/5ML PO SYRP: 5.0000 mL | ORAL_SOLUTION | Freq: Two times a day (BID) | ORAL | 0 refills | Status: DC | PRN

## 2019-07-11 NOTE — Telephone Encounter (Addendum)
Replied via mychart. rec flonase and astelin for possible allergic component to cough. I also sent codeine cough syrup to pharmacy Will recommend OV if not better with flonase and astelin.

## 2019-07-11 NOTE — Addendum Note (Signed)
Addended by: [name] on: 07/11/2019 05:39 PM   Modules accepted: Orders

## 2019-07-11 NOTE — Telephone Encounter (Signed)
Patient called and said he received a rx for Zithromax  and it didn't work.  Patient's requesting a cough medicine and something stronger than Zithromax called in to [place] [employer].  Patient has a productive cough.  Patient's had it off and on for a month.  Patient said as soon as he finished the Zithromax the cough came back.

## 2019-07-13 ENCOUNTER — Other Ambulatory Visit: Payer: Self-pay | Admitting: Family Medicine

## 2019-07-16 ENCOUNTER — Ambulatory Visit (INDEPENDENT_AMBULATORY_CARE_PROVIDER_SITE_OTHER): Payer: Medicare Other | Admitting: Family Medicine

## 2019-07-16 ENCOUNTER — Encounter: Payer: Self-pay | Admitting: Family Medicine

## 2019-07-16 VITALS — BP 130/68 | HR 80 | Temp 96.8°F | Ht 68.0 in | Wt 190.0 lb

## 2019-07-16 DIAGNOSIS — K219 Gastro-esophageal reflux disease without esophagitis: Secondary | ICD-10-CM | POA: Diagnosis not present

## 2019-07-16 DIAGNOSIS — R0982 Postnasal drip: Secondary | ICD-10-CM | POA: Insufficient documentation

## 2019-07-16 DIAGNOSIS — R05 Cough: Secondary | ICD-10-CM

## 2019-07-16 DIAGNOSIS — R058 Other specified cough: Secondary | ICD-10-CM

## 2019-07-16 MED ORDER — FAMOTIDINE 20 MG PO TABS: 20.0000 mg | ORAL_TABLET | Freq: Every day | ORAL | Status: DC

## 2019-07-16 NOTE — Progress Notes (Signed)
Virtual visit completed through [employer]. Due to national recommendations of social distancing due to COVID-19, a virtual visit is felt to be most appropriate for this patient at this time. Reviewed limitations of a virtual visit. Interactive audio and video telecommunications were attempted between myself and [name], MD, however failed due to patient having technical difficulties OR patient not having access to video capability.  We continued and completed visit with audio only.  Time: 11:05am - 11:24am   Patient location: home Provider location: [place] at [facility], office If any vitals were documented, they were collected by patient at home unless specified below.    BP 130/68   Pulse 80   Temp (!) 96.8 F (36 C)   Ht 5\' 8"  (1.727 m)   Wt 190 lb (86.2 kg)   SpO2 96%   BMI 28.89 kg/m    CC: ongoing cough Subjective:    Patient ID: [name], MD, male    DOB: October 02, 1930, 85 y.o.   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 Not improving.  Started about 1 mo ago.  Seen and treated previously. )   See prior notes for details.  Ongoing productive cough. Seen at [facility] ER 06/21/2019 with cough gen malaise and nausea, note reviewed. Covid test negative. CXR negative. Thought viral infection rec supportive care. Previously treated 05/30/2019 with zpack for ongoing cough. We also recommended changing from afrin to daily astelin and flonase for ongoing drainage.   He did note benefit while on azithromycin but symptoms returned a few days after finishing course. He is using OTC flonase PRN and astelin PRN. No longer using afrin.   Ongoing post nasal drainage associated with productive cough of pearly white sputum worse in afternoons and evenings. Nasal congestion > rhinorrhea worse as day progresses. Ongoing throat clearing particularly when eating despite daily aciphex.  No  fevers/chills, ST, wheezing or dyspnea, .   Has not been using cheratussin.   Add pepcid (famotidine) 20mg  at night.      Relevant past medical, surgical, family and social history reviewed and updated as indicated. Interim medical history since our last visit reviewed. Allergies and medications reviewed and updated. Outpatient Medications Prior to Visit  Medication Sig Dispense Refill  . Alpha-Lipoic Acid 600 MG CAPS Take 1 capsule (600 mg total) by mouth 2 (two) times daily. 60 capsule 11  . atorvastatin (LIPITOR) 20 MG tablet     . azelastine (ASTELIN) 0.1 % nasal spray Place 1 spray into both nostrils 2 (two) times daily as needed for rhinitis. Use in each nostril as directed 30 mL 6  . capsaicin (ZOSTRIX) 0.025 % cream Apply topically 2 (two) times daily. 60 g 0  . clopidogrel (PLAVIX) 75 MG tablet TAKE 1 TABLET DAILY 90 tablet 3  . Coenzyme Q10 (COQ10) 150 MG CAPS Take 1 capsule by mouth 2 (two) times daily.    . CRESTOR 40 MG tablet TAKE 1 TABLET DAILY 90 tablet 1  . diazepam (VALIUM) 5 MG tablet Take 1 tablet (5 mg total) by mouth every 8 (eight) hours as needed for muscle spasms. 90 tablet 0  . diclofenac sodium (VOLTAREN) 1 % GEL APPLY 2 GRAMS TOPICALLY FOUR TIMES A DAY 300 g 1  . DULoxetine (CYMBALTA) 60 MG capsule TAKE 1 CAPSULE DAILY 90 capsule 3  . FOLBEE 2.5-25-1 MG TABS tablet TAKE 1 TABLET DAILY 90 tablet 1  .  gabapentin (NEURONTIN) 300 MG capsule Take 2 capsules (600 mg total) by mouth 2 (two) times daily. 360 capsule 1  . guaiFENesin-codeine (CHERATUSSIN AC) 100-10 MG/5ML syrup Take 5 mLs by mouth 2 (two) times daily as needed for cough or congestion. 120 mL 0  . Insulin Degludec (TRESIBA FLEXTOUCH) 200 UNIT/ML SOPN Inject 60 Units into the skin at bedtime.     . Insulin Pen Needle (NOVOFINE) 30G X 8 MM MISC Use to inject insulin once daily dx:E11.40 300 each 3  . levothyroxine (LEVOTHROID) 25 MCG tablet Take 1 tablet (25 mcg total) by mouth daily before breakfast. Per  endo [name]    . metoprolol tartrate (LOPRESSOR) 25 MG tablet TAKE ONE-HALF (1/2) TABLET (12.5 MG) AT BEDTIME WITH MORNING DOSE IF NEEDED 90 tablet 2  . metroNIDAZOLE (METROCREAM) 0.75 % cream Apply 1 application topically 3 (three) times daily.    . nitroGLYCERIN (NITROSTAT) 0.4 MG SL tablet DISSOLVE 1 TABLET UNDER THE TONGUE EVERY 5 MINUTES AS NEEDED FOR CHEST PAIN 25 tablet 1  . omega-3 acid ethyl esters (LOVAZA) 1 g capsule Take 2 capsules (2 g total) by mouth 2 (two) times daily. 360 capsule 1  . pramipexole (MIRAPEX) 1 MG tablet TAKE 2 TABLETS AT 3 P.M. AND 2 TABLETS AT BEDTIME AS NEEDED 360 tablet 0  . RABEprazole (ACIPHEX) 20 MG tablet TAKE 1 TABLET DAILY 90 tablet 0  . Semaglutide (OZEMPIC) 0.25 or 0.5 MG/DOSE SOPN Inject into the skin.    Marland Kitchen traZODone (DESYREL) 50 MG tablet Take 50 mg by mouth at bedtime as needed for sleep.    . valsartan (DIOVAN) 40 MG tablet TAKE 1 TABLET DAILY 90 tablet 3  . vitamin C (ASCORBIC ACID) 500 MG tablet Take 1,000 mg by mouth daily.      No facility-administered medications prior to visit.      Per HPI unless specifically indicated in ROS section below Review of Systems Objective:    BP 130/68   Pulse 80   Temp (!) 96.8 F (36 C)   Ht 5\' 8"  (1.727 m)   Wt 190 lb (86.2 kg)   SpO2 96%   BMI 28.89 kg/m   Wt Readings from Last 3 Encounters:  07/16/19 190 lb (86.2 kg)  06/21/19 190 lb (86.2 kg)  05/30/19 190 lb (86.2 kg)     Physical exam: Gen: alert, NAD, not ill appearing Pulm: speaks in complete sentences without increased work of breathing Psych: normal mood, normal thought content      Results for orders placed or performed during the hospital encounter of 06/21/19  Urine culture   Specimen: Urine, Random  Result Value Ref Range   Specimen Description      URINE, RANDOM Performed at [facility], [address] [place], [place] [address]    Special Requests      NONE Performed at [facility], [address] [place], [place] [address]    Culture      Multiple bacterial morphotypes present, none predominant. Suggest appropriate recollection if clinically indicated.   Report Status 06/23/2019 FINAL   Novel Coronavirus, NAA (Hosp order, Send-out to Ref Lab; TAT 18-24 hrs   Specimen: Nasopharyngeal Swab; Respiratory  Result Value Ref Range   SARS-CoV-2, NAA NOT DETECTED NOT DETECTED   Coronavirus Source NASOPHARYNGEAL   CBC  Result Value Ref Range   WBC 7.6 4.0 - 10.5 K/uL   RBC 4.41 4.22 - 5.81 MIL/uL   Hemoglobin 13.2 13.0 - 17.0  g/dL   HCT 39.4 39.0 - 52.0 %   MCV 89.3 80.0 - 100.0 fL   MCH 29.9 26.0 - 34.0 pg   MCHC 33.5 30.0 - 36.0 g/dL   RDW 13.2 11.5 - 15.5 %   Platelets 185 150 - 400 K/uL   nRBC 0.0 0.0 - 0.2 %  Comprehensive metabolic panel  Result Value Ref Range   Sodium 135 135 - 145 mmol/L   Potassium 4.0 3.5 - 5.1 mmol/L   Chloride 105 98 - 111 mmol/L   CO2 24 22 - 32 mmol/L   Glucose, Bld 208 (H) 70 - 99 mg/dL   BUN 32 (H) 8 - 23 mg/dL   Creatinine, Ser 1.31 (H) 0.61 - 1.24 mg/dL   Calcium 8.7 (L) 8.9 - 10.3 mg/dL   Total Protein 7.0 6.5 - 8.1 g/dL   Albumin 3.6 3.5 - 5.0 g/dL   AST 20 15 - 41 U/L   ALT 17 0 - 44 U/L   Alkaline Phosphatase 47 38 - 126 U/L   Total Bilirubin 0.7 0.3 - 1.2 mg/dL   GFR calc non Af Amer 49 (L) >60 mL/min   GFR calc Af Amer 57 (L) >60 mL/min   Anion gap 6 5 - 15  Urinalysis, Complete w Microscopic  Result Value Ref Range   Color, Urine STRAW (A) YELLOW   APPearance CLEAR (A) CLEAR   Specific Gravity, Urine 1.006 1.005 - 1.030   pH 5.0 5.0 - 8.0   Glucose, UA 50 (A) NEGATIVE mg/dL   Hgb urine dipstick NEGATIVE NEGATIVE   Bilirubin Urine NEGATIVE NEGATIVE   Ketones, ur NEGATIVE NEGATIVE mg/dL   Protein, ur NEGATIVE NEGATIVE mg/dL   Nitrite NEGATIVE NEGATIVE   Leukocytes,Ua NEGATIVE NEGATIVE   RBC / HPF 0-5 0 - 5 RBC/hpf   WBC, UA 0-5 0 - 5 WBC/hpf   Bacteria, UA RARE (A) NONE SEEN   Squamous Epithelial / LPF 0-5 0 - 5    Mucus PRESENT   Troponin I (High Sensitivity)  Result Value Ref Range   Troponin I (High Sensitivity) 3 <18 ng/L   Assessment & Plan:   Problem List Items Addressed This Visit    Recurrent productive cough - Primary    Reviewed ER visit with patient with reassuring CXR. Symptoms today not consistent with bacterial infection (no fever, purulent sputum, dyspnea) but rather anticipate combination of hidden GERD and allergic rhinitis. Treat accordingly with scheduled astelin BID, flonase daily, continue aciphex, add pepcid at night. Update with effect in 1 wk. Consider changing PPI. Pt agrees with plan.       Post-nasal drainage    Discussed regular astelin and flonase use.  Now off afrin.       GERD (gastroesophageal reflux disease)    Prior PPIs tried include omeprazole, nexium, pantoprazole, aciphex. Add pepid at night. Consider dexilant if breakthrough GERD.      Relevant Medications   famotidine (PEPCID) 20 MG tablet       Meds ordered this encounter  Medications  . famotidine (PEPCID) 20 MG tablet    Sig: Take 1 tablet (20 mg total) by mouth at bedtime.   No orders of the defined types were placed in this encounter.   I discussed the assessment and treatment plan with the patient. The patient was provided an opportunity to ask questions and all were answered. The patient agreed with the plan and demonstrated an understanding of the instructions. The patient was advised to call back or  seek an in-person evaluation if the symptoms worsen or if the condition fails to improve as anticipated.  Follow up plan: No follow-ups on file.  [name], MD

## 2019-07-16 NOTE — Assessment & Plan Note (Signed)
Discussed regular astelin and flonase use.  Now off afrin.

## 2019-07-16 NOTE — Assessment & Plan Note (Signed)
Prior PPIs tried include omeprazole, nexium, pantoprazole, aciphex. Add pepid at night. Consider dexilant if breakthrough GERD.

## 2019-07-16 NOTE — Assessment & Plan Note (Signed)
Reviewed ER visit with patient with reassuring CXR. Symptoms today not consistent with bacterial infection (no fever, purulent sputum, dyspnea) but rather anticipate combination of hidden GERD and allergic rhinitis. Treat accordingly with scheduled astelin BID, flonase daily, continue aciphex, add pepcid at night. Update with effect in 1 wk. Consider changing PPI. Pt agrees with plan.

## 2019-08-21 DIAGNOSIS — E1165 Type 2 diabetes mellitus with hyperglycemia: Secondary | ICD-10-CM | POA: Diagnosis not present

## 2019-08-21 DIAGNOSIS — E785 Hyperlipidemia, unspecified: Secondary | ICD-10-CM | POA: Diagnosis not present

## 2019-08-21 DIAGNOSIS — Z794 Long term (current) use of insulin: Secondary | ICD-10-CM | POA: Diagnosis not present

## 2019-08-21 DIAGNOSIS — E1142 Type 2 diabetes mellitus with diabetic polyneuropathy: Secondary | ICD-10-CM | POA: Diagnosis not present

## 2019-08-21 DIAGNOSIS — E039 Hypothyroidism, unspecified: Secondary | ICD-10-CM | POA: Diagnosis not present

## 2019-08-21 LAB — HEMOGLOBIN A1C: Hemoglobin A1C: 7.9

## 2019-08-21 LAB — TSH: TSH: 4.91 (ref 0.41–5.90)

## 2019-08-25 ENCOUNTER — Encounter: Payer: Self-pay | Admitting: Family Medicine

## 2019-09-09 ENCOUNTER — Encounter: Payer: Self-pay | Admitting: Neurology

## 2019-09-09 NOTE — Progress Notes (Signed)
Virtual Visit via Video Note The purpose of this virtual visit is to provide medical care while limiting exposure to the novel coronavirus.    Consent was obtained for video visit:  Yes.   Answered questions that patient had about telehealth interaction:  Yes.   I discussed the limitations, risks, security and privacy concerns of performing an evaluation and management service by telemedicine. I also discussed with the patient that there may be a patient responsible charge related to this service. The patient expressed understanding and agreed to proceed.  Pt location: Home Physician Location: office Name of referring provider:  [name], MD I connected with [name], MD at patients initiation/request on 09/10/2019 at  9:50 AM EST by video enabled telemedicine application and verified that I am speaking with the correct person using two identifiers. 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  UPDATE: Due to fluctuating diplopia later in the day, myasthenia gravis panel was checked, which was negative.  For headaches, gabapentin was increased (300mg /600mg /600mg /sometimes another 300mg ) daily and advised to recheck CPAP settings.  He reports that he has gotten better in taking care of his CPAP, such as cleaning.  It has made a different.  Headaches have improved.  However, he reports worsening restless leg.  Balance is still off.  Double vision is still there.  He has history of exophoria for many years and it has been weakened by stroke.  It is mild and his ophthalmologist has been following.    HISTORY: In December 2019, he started having  headaches, described as dull non-throbbing holocephalic headache upon waking up in the morning.  He wakes up in the morning with them and they typically last a couple hours and occur 5 days a week.  They are not associated with nausea, photophobia, phonophobia or unilateral numbness and weakness  He also nods off often during the day.  He has OSA and uses a CPAP.  For several years, he has had some mild infrequent episodes of intermittent double vision, which occur later in the day.  However, over the past several months, it has gotten worse. He has residual right sided hemiplegia from stroke in 2013 and started feeling weaker on the right sided with increased falls.  He had an MRI of the brain with and without contrast on 10/26/18, which was personally reviewed and showed chronic infarct involving the left corona radiata and lentiform nucleus, unchanged, but no new, acute or subacute intracranial abnormalities.  Sed rate was normal.  He has type 2 diabetes with known polyneuropathy.  Over the past 8 months, his Hgb A1c has steadily increased from 5.8 to 7.1.    His left hemispheric stroke occurred in June 2013.  Since then, he has had "TIAs" requiring repeat imaging.  MRI and MRA of head was performed on 03/18/15 to evaluate worsening right sided weakness and abnormal speech and showed no acute changes.  MRI of brain with and without contrast from 01/14/18 to evaluate episode of diplopia was also negative for acute changes.   Past antidepressant:  Sertraline  Past Medical History: Past Medical History:  Diagnosis Date  . Anemia   . Basal  cell carcinoma 2018   R temple   . Basal cell carcinoma of nose 2002   bridge of nose  . BPH with obstruction/lower urinary tract symptoms    failed flomax, uroxatral, myrbetriq - sees urology [name]) pending videourodynamics  . CKD (chronic kidney disease) stage 3, GFR 30-59 ml/min 03/11/2012   Renal US 02/2014 - multiple kidney cysts   . CVA (cerebral  infarction) 03/07/12   slurred speech; right hemplegia, left handed - completed PT 07/2014 (17 sessions)  . Depression    mild, on cymbalta per prior PCP  . Erectile dysfunction    per urology (prostaglandin injection)  . GERD (gastroesophageal reflux disease)    with sliding HH  . H/O hiatal hernia   . History of kidney problems 12/2010   lacerated left kidney after fall  . HLD (hyperlipidemia)   . Hx of basal cell carcinoma 07/10/2017   R. lat. forehead near hair line  . Kidney cysts 02/2014   bilateral (renal US)  . Migraines 03/11/12   now rare  . OSA on CPAP    sleep study 01/2014 - severe, CPAP at 10, previously on nuvigil (Dr. [name])  . Palpitations 01/2012   holter WNL, rare PAC/PVCs  . Presbycusis of both ears 2016   rec amplification [facility])  . Restless leg syndrome    well controlled on mirapex  . Rosacea    Kowalski  . TIA (transient ischemic attack)   . Type 2 diabetes, controlled, with neuropathy [facility]) 2011   [name] in [place]    Medications: Outpatient Encounter Medications as of 09/10/2019  Medication Sig  . Alpha-Lipoic Acid 600 MG CAPS Take 1 capsule (600 mg total) by mouth 2 (two) times daily.  Marland Kitchen atorvastatin (LIPITOR) 20 MG tablet   . azelastine (ASTELIN) 0.1 % nasal spray Place 1 spray into both nostrils 2 (two) times daily as needed for rhinitis. Use in each nostril as directed  . capsaicin (ZOSTRIX) 0.025 % cream Apply topically 2 (two) times daily.  . clopidogrel (PLAVIX) 75 MG tablet TAKE 1 TABLET DAILY  . Coenzyme Q10 (COQ10) 150 MG CAPS Take 1 capsule by mouth 2 (two) times daily.  . CRESTOR 40 MG tablet TAKE 1 TABLET DAILY  . diazepam (VALIUM) 5 MG tablet Take 1 tablet (5 mg total) by mouth every 8 (eight) hours as needed for muscle spasms.  . diclofenac sodium (VOLTAREN) 1 % GEL APPLY 2 GRAMS TOPICALLY FOUR TIMES A DAY  . DULoxetine (CYMBALTA) 60 MG capsule TAKE 1 CAPSULE DAILY  . famotidine (PEPCID) 20 MG tablet Take 1 tablet (20 mg total) by  mouth at bedtime.  Marland Kitchen FOLBEE 2.5-25-1 MG TABS tablet TAKE 1 TABLET DAILY  . gabapentin (NEURONTIN) 300 MG capsule Take 2 capsules (600 mg total) by mouth 2 (two) times daily.  Marland Kitchen guaiFENesin-codeine (CHERATUSSIN AC) 100-10 MG/5ML syrup Take 5 mLs by mouth 2 (two) times daily as needed for cough or congestion.  . Insulin Degludec (TRESIBA FLEXTOUCH) 200 UNIT/ML SOPN Inject 60 Units into the skin at bedtime.   . Insulin Pen Needle (NOVOFINE) 30G X 8 MM MISC Use to inject insulin once daily dx:E11.40  . levothyroxine (LEVOTHROID) 25 MCG tablet Take 1 tablet (25 mcg total) by mouth daily before breakfast. Per endo [name]  . metoprolol tartrate (LOPRESSOR) 25 MG tablet TAKE ONE-HALF (1/2) TABLET (12.5 MG) AT BEDTIME WITH MORNING DOSE IF NEEDED  . metroNIDAZOLE (METROCREAM) 0.75 % cream Apply 1 application topically 3 (three) times daily.  Marland Kitchen  nitroGLYCERIN (NITROSTAT) 0.4 MG SL tablet DISSOLVE 1 TABLET UNDER THE TONGUE EVERY 5 MINUTES AS NEEDED FOR CHEST PAIN  . omega-3 acid ethyl esters (LOVAZA) 1 g capsule Take 2 capsules (2 g total) by mouth 2 (two) times daily.  . pramipexole (MIRAPEX) 1 MG tablet TAKE 2 TABLETS AT 3 P.M. AND 2 TABLETS AT BEDTIME AS NEEDED  . RABEprazole (ACIPHEX) 20 MG tablet TAKE 1 TABLET DAILY  . Semaglutide (OZEMPIC) 0.25 or 0.5 MG/DOSE SOPN Inject into the skin.  Marland Kitchen traZODone (DESYREL) 50 MG tablet Take 50 mg by mouth at bedtime as needed for sleep.  . valsartan (DIOVAN) 40 MG tablet TAKE 1 TABLET DAILY  . vitamin C (ASCORBIC ACID) 500 MG tablet Take 1,000 mg by mouth daily.    No facility-administered encounter medications on file as of 09/10/2019.    Allergies: Allergies  Allergen Reactions  . Lisinopril Cough    tachycardia  . Nexium [Esomeprazole] Other (See Comments)    Headache  . Niacin And Related Other (See Comments)    flushing    Family History: Family History  Problem Relation Age of Onset  . Stroke Father   . Lung cancer Father 51        smoker  .  Stroke Brother   . Diabetes Brother 19  . Lung cancer Brother   . Alcoholism Sister   . Kidney disease Sister   . Coronary artery disease Neg Hx     Social History: Social History   Socioeconomic History  . Marital status: Divorced    Spouse name: Not on file  . Number of children: 2  . Years of education: MD  . Highest education level: Not on file  Occupational History  . Occupation: retired    Fish farm manager: RETIRED  Tobacco Use  . Smoking status: Never Smoker  . Smokeless tobacco: Never Used  . Tobacco comment: "used to smoke pipe when I was younger"  Substance and Sexual Activity  . Alcohol use: Yes    Alcohol/week: 4.0 standard drinks    Types: 2 Glasses of wine, 2 Shots of liquor per week    Comment: occasional  . Drug use: No  . Sexual activity: Yes  Other Topics Concern  . Not on file  Social History Narrative   Caffeine: 1 cup coffee/day    Occupation: ophthalmologist, retired   Edu: MD   Activity: going to gym 3x/wk with trainer   Diet: some water, fruits/vegetables some, red meat a few times, fish 1x/wk   POA - [name] (Daughter)   Social Determinants of Health   Financial Resource Strain:   . Difficulty of Paying Living Expenses: Not on file  Food Insecurity:   . Worried About Charity fundraiser in the Last Year: Not on file  . Ran Out of Food in the Last Year: Not on file  Transportation Needs:   . Lack of Transportation (Medical): Not on file  . Lack of Transportation (Non-Medical): Not on file  Physical Activity:   . Days of Exercise per Week: Not on file  . Minutes of Exercise per Session: Not on file  Stress:   . Feeling of Stress : Not on file  Social Connections:   . Frequency of Communication with Friends and Family: Not on file  . Frequency of Social Gatherings with Friends and Family: Not on file  . Attends Religious Services: Not on file  . Active Member of Clubs or Organizations: Not on file  . Attends  Club or Organization  Meetings: Not on file  . Marital Status: Not on file  Intimate Partner Violence:   . Fear of Current or Ex-Partner: Not on file  . Emotionally Abused: Not on file  . Physically Abused: Not on file  . Sexually Abused: Not on file    Observations/Objective:   Height 5\' 8"  (1.727 m), weight 190 lb (86.2 kg). No acute distress.  Alert and oriented.  Speech fluent and not dysarthric.  Language intact.  Eyes orthophoric on primary gaze.  Face symmetric.  Assessment and Plan:   1.  Right hemiparesis as late effect of stroke 2.  Diplopia.  Has longstanding history of exophoria.  Myasthenia gravis panel negative.  3.  Headaches, resolved 4.  Unsteady gait, which I attribute to right lower extremity weakness and diabetic polyneuropathy  Follow up as needed.  Follow Up Instructions:    -I discussed the assessment and treatment plan with the patient. The patient was provided an opportunity to ask questions and all were answered. The patient agreed with the plan and demonstrated an understanding of the instructions.   The patient was advised to call back or seek an in-person evaluation if the symptoms worsen or if the condition fails to improve as anticipated.    Total Time spent in visit with the patient was:  19 minutes   Dudley Major, DO

## 2019-09-10 ENCOUNTER — Telehealth (INDEPENDENT_AMBULATORY_CARE_PROVIDER_SITE_OTHER): Payer: Medicare Other | Admitting: Neurology

## 2019-09-10 ENCOUNTER — Other Ambulatory Visit: Payer: Self-pay

## 2019-09-10 VITALS — Ht 68.0 in | Wt 190.0 lb

## 2019-09-10 DIAGNOSIS — I69351 Hemiplegia and hemiparesis following cerebral infarction affecting right dominant side: Secondary | ICD-10-CM | POA: Diagnosis not present

## 2019-09-10 DIAGNOSIS — H532 Diplopia: Secondary | ICD-10-CM

## 2019-09-12 ENCOUNTER — Other Ambulatory Visit: Payer: Self-pay | Admitting: Family Medicine

## 2019-09-16 NOTE — Telephone Encounter (Signed)
Last filled on 07/07/2019 #360 with 0 refill. LOV 07/16/2019 for acute visit  Next appointment on 09/30/2019

## 2019-09-26 DIAGNOSIS — G4733 Obstructive sleep apnea (adult) (pediatric): Secondary | ICD-10-CM | POA: Diagnosis not present

## 2019-09-30 ENCOUNTER — Encounter: Payer: Self-pay | Admitting: Family Medicine

## 2019-09-30 ENCOUNTER — Ambulatory Visit (INDEPENDENT_AMBULATORY_CARE_PROVIDER_SITE_OTHER): Payer: Medicare HMO | Admitting: Family Medicine

## 2019-09-30 ENCOUNTER — Other Ambulatory Visit: Payer: Self-pay

## 2019-09-30 VITALS — BP 102/62 | HR 83 | Temp 97.7°F | Wt 198.0 lb

## 2019-09-30 DIAGNOSIS — G2581 Restless legs syndrome: Secondary | ICD-10-CM | POA: Diagnosis not present

## 2019-09-30 DIAGNOSIS — E1140 Type 2 diabetes mellitus with diabetic neuropathy, unspecified: Secondary | ICD-10-CM

## 2019-09-30 LAB — IBC PANEL
Iron: 115 ug/dL (ref 42–165)
Saturation Ratios: 34.5 % (ref 20.0–50.0)
Transferrin: 238 mg/dL (ref 212.0–360.0)

## 2019-09-30 LAB — FERRITIN: Ferritin: 28.3 ng/mL (ref 22.0–322.0)

## 2019-09-30 NOTE — Progress Notes (Signed)
This visit was conducted in person.  BP 102/62 (BP Location: Left Arm, Patient Position: Sitting, Cuff Size: Large)   Pulse 83   Temp 97.7 F (36.5 C) (Temporal)   Wt 198 lb (89.8 kg)   SpO2 98%   BMI 30.11 kg/m    CC: 3 mo f/u visit Subjective:    Patient ID: James Bell, James Bell, male    DOB: [birth year], 86 y.o.   MRN: [id number]  HPI: James Bell, James Bell is a 86 y.o. male presenting on 09/30/2019 for Follow-up   He has been scheduled for covid vaccine 10/12/2019.   RLS - ongoing trouble - has increased neurontin to 600mg  TID AC and HS. Also on mirapex 2mg  at afternoon and 2mg  at bedtime. Progressively worsening symptoms despite high doses. Describes leg twitching throughout the day, worse at night time. No arm symptoms.   DM - sees [name] endo - saw late last year. Does regularly check sugars about once a day. Compliant with antihyperglycemic regimen which includes: ozempic 0.5mg  weekly, degludec insulin 80u daily. Denies low sugars or hypoglycemic symptoms. Denies paresthesias. Last diabetic eye exam 10/2018. Pneumovax: 2010. Prevnar: 2015. Glucometer brand: "instant" DSME: has not completed, declines. Lab Results  Component Value Date   HGBA1C 7.9 08/21/2019   Diabetic Foot Exam - Simple   No data filed     Lab Results  Component Value Date   MICROALBUR 2.0 (H) 04/21/2013        Relevant past medical, surgical, family and social history reviewed and updated as indicated. Interim medical history since our last visit reviewed. Allergies and medications reviewed and updated. Outpatient Medications Prior to Visit  Medication Sig Dispense Refill  . Alpha-Lipoic Acid 600 MG CAPS Take 1 capsule (600 mg total) by mouth 2 (two) times daily. 60 capsule 11  . atorvastatin (LIPITOR) 20 MG tablet     . azelastine (ASTELIN) 0.1 % nasal spray Place 1 spray into both nostrils 2 (two) times daily as needed for rhinitis. Use in each nostril as directed 30 mL 6  . capsaicin  (ZOSTRIX) 0.025 % cream Apply topically 2 (two) times daily. 60 g 0  . clopidogrel (PLAVIX) 75 MG tablet TAKE 1 TABLET DAILY 90 tablet 3  . Coenzyme Q10 (COQ10) 150 MG CAPS Take 1 capsule by mouth 2 (two) times daily.    . CRESTOR 40 MG tablet TAKE 1 TABLET DAILY 90 tablet 1  . diazepam (VALIUM) 5 MG tablet Take 1 tablet (5 mg total) by mouth every 8 (eight) hours as needed for muscle spasms. 90 tablet 0  . diclofenac sodium (VOLTAREN) 1 % GEL APPLY 2 GRAMS TOPICALLY FOUR TIMES A DAY 300 g 1  . DULoxetine (CYMBALTA) 60 MG capsule TAKE 1 CAPSULE DAILY 90 capsule 3  . famotidine (PEPCID) 20 MG tablet Take 1 tablet (20 mg total) by mouth at bedtime.    Marland Kitchen FOLBEE 2.5-25-1 MG TABS tablet TAKE 1 TABLET DAILY 90 tablet 1  . gabapentin (NEURONTIN) 300 MG capsule Take 2 capsules (600 mg total) by mouth 2 (two) times daily. (Patient taking differently: Take 600 mg by mouth 2 (two) times daily. Pt is taken 8 or so a day) 360 capsule 1  . guaiFENesin-codeine (CHERATUSSIN AC) 100-10 MG/5ML syrup Take 5 mLs by mouth 2 (two) times daily as needed for cough or congestion. 120 mL 0  . Insulin Degludec (TRESIBA FLEXTOUCH) 200 UNIT/ML SOPN Inject 80 Units into the skin at bedtime.     Marland Kitchen  Insulin Pen Needle (NOVOFINE) 30G X 8 MM MISC Use to inject insulin once daily dx:E11.40 300 each 3  . levothyroxine (LEVOTHROID) 25 MCG tablet Take 1 tablet (25 mcg total) by mouth daily before breakfast. Per endo [name]    . metoprolol tartrate (LOPRESSOR) 25 MG tablet TAKE ONE-HALF (1/2) TABLET (12.5 MG) AT BEDTIME WITH MORNING DOSE IF NEEDED 90 tablet 2  . metroNIDAZOLE (METROCREAM) 0.75 % cream Apply 1 application topically 3 (three) times daily.    . nitroGLYCERIN (NITROSTAT) 0.4 MG SL tablet DISSOLVE 1 TABLET UNDER THE TONGUE EVERY 5 MINUTES AS NEEDED FOR CHEST PAIN 25 tablet 1  . omega-3 acid ethyl esters (LOVAZA) 1 g capsule Take 2 capsules (2 g total) by mouth 2 (two) times daily. 360 capsule 1  . RABEprazole (ACIPHEX) 20 MG  tablet TAKE 1 TABLET DAILY 90 tablet 0  . Semaglutide (OZEMPIC) 0.25 or 0.5 MG/DOSE SOPN Inject into the skin.    Marland Kitchen traZODone (DESYREL) 50 MG tablet Take 50 mg by mouth at bedtime as needed for sleep.    . valsartan (DIOVAN) 40 MG tablet TAKE 1 TABLET DAILY 90 tablet 3  . vitamin C (ASCORBIC ACID) 500 MG tablet Take 1,000 mg by mouth daily.     . pramipexole (MIRAPEX) 1 MG tablet TAKE 2 TABLETS AT 3 P.M. AND 2 TABLETS AT BEDTIME AS NEEDED 360 tablet 3   No facility-administered medications prior to visit.     Per HPI unless specifically indicated in ROS section below Review of Systems Objective:    BP 102/62 (BP Location: Left Arm, Patient Position: Sitting, Cuff Size: Large)   Pulse 83   Temp 97.7 F (36.5 C) (Temporal)   Wt 198 lb (89.8 kg)   SpO2 98%   BMI 30.11 kg/m   Wt Readings from Last 3 Encounters:  09/30/19 198 lb (89.8 kg)  09/09/19 190 lb (86.2 kg)  07/16/19 190 lb (86.2 kg)    Physical Exam Vitals and nursing note reviewed.  Constitutional:      General: He is not in acute distress.    Appearance: He is well-developed.  HENT:     Head: Normocephalic and atraumatic.     Right Ear: External ear normal.     Left Ear: External ear normal.     Nose: Nose normal.     Mouth/Throat:     Pharynx: No oropharyngeal exudate.  Eyes:     General: No scleral icterus.    Conjunctiva/sclera: Conjunctivae normal.     Pupils: Pupils are equal, round, and reactive to light.  Cardiovascular:     Rate and Rhythm: Normal rate and regular rhythm.     Heart sounds: Normal heart sounds. No murmur.  Pulmonary:     Effort: Pulmonary effort is normal. No respiratory distress.     Breath sounds: Normal breath sounds. No wheezing or rales.  Musculoskeletal:     Cervical back: Normal range of motion and neck supple.     Comments: See HPI for foot exam if done  Lymphadenopathy:     Cervical: No cervical adenopathy.  Skin:    General: Skin is warm and dry.     Findings: No rash.        Results for orders placed or performed in visit on 09/30/19  IBC panel  Result Value Ref Range   Iron 115 42 - 165 ug/dL   Transferrin 238.0 212.0 - 360.0 mg/dL   Saturation Ratios 34.5 20.0 - 50.0 %  Ferritin  Result Value Ref Range   Ferritin 28.3 22.0 - 322.0 ng/mL   Lab Results  Component Value Date   CREATININE 1.31 (H) 06/21/2019   BUN 32 (H) 06/21/2019   NA 135 06/21/2019   K 4.0 06/21/2019   CL 105 06/21/2019   CO2 24 06/21/2019    Assessment & Plan:  This visit occurred during the SARS-CoV-2 public health emergency.  Safety protocols were in place, including screening questions prior to the visit, additional usage of staff PPE, and extensive cleaning of exam room while observing appropriate contact time as indicated for disinfecting solutions.   Problem List Items Addressed This Visit    Type 2 diabetes, controlled, with neuropathy (HCC)    Sees endo [name]. On degludec and ozempic. Latest A1c was elevated. Has not completed diabetes education - will refer. Interested in seeing nutritionist.       Relevant Orders   Ambulatory referral to diabetic education   Restless leg syndrome - Primary    Has needed to gradually increase gabapentin and mirapex dosing due to increasing symptoms - concern for augmentation developing on high dose mirapex - he is already on too high a dose. Will recommend slow taper of miralex to appropriate levels (0.75mg  nightly). Will check iron stores and if low, replete accordingly. Consider switch to requip vs long acting transdermal rotigotine if affordable.       Relevant Orders   IBC panel (Completed)   Ferritin (Completed)       Meds ordered this encounter  Medications  . ferrous sulfate 325 (65 FE) MG tablet    Sig: Take 1 tablet (325 mg total) by mouth every other day.  . pramipexole (MIRAPEX) 1 MG tablet    Sig: Take 1 tablet (1 mg total) by mouth 2 (two) times daily. For RLS    Dispense:  180 tablet    Refill:  1     Note new sig   Orders Placed This Encounter  Procedures  . IBC panel  . Ferritin  . Ambulatory referral to diabetic education    Referral Priority:   Routine    Referral Type:   Consultation    Referral Reason:   Specialty Services Required    Number of Visits Requested:   1    Patient Instructions  Iron panel today.  Possible augmentation of RLS symptoms on mirapex - will be in touch with plan after iron levels return.  We will refer you to diabetes education at [name].    Follow up plan: Return in about 4 months (around 01/28/2020) for follow up visit.  [name], James Bell

## 2019-09-30 NOTE — Patient Instructions (Addendum)
Iron panel today.  Possible augmentation of RLS symptoms on mirapex - will be in touch with plan after iron levels return.  We will refer you to diabetes education at [facility].

## 2019-10-01 MED ORDER — PRAMIPEXOLE DIHYDROCHLORIDE 1 MG PO TABS: 1.0000 mg | ORAL_TABLET | Freq: Two times a day (BID) | ORAL | 1 refills | Status: DC

## 2019-10-01 MED ORDER — FERROUS SULFATE 325 (65 FE) MG PO TABS: 325.0000 mg | ORAL_TABLET | ORAL | Status: DC

## 2019-10-01 NOTE — Assessment & Plan Note (Signed)
Has needed to gradually increase gabapentin and mirapex dosing due to increasing symptoms - concern for augmentation developing on high dose mirapex - he is already on too high a dose. Will recommend slow taper of miralex to appropriate levels (0.75mg  nightly). Will check iron stores and if low, replete accordingly. Consider switch to requip vs long acting transdermal rotigotine if affordable.

## 2019-10-01 NOTE — Assessment & Plan Note (Signed)
Sees endo [name]. On degludec and ozempic. Latest A1c was elevated. Has not completed diabetes education - will refer. Interested in seeing nutritionist.

## 2019-10-07 ENCOUNTER — Ambulatory Visit: Payer: Medicare HMO | Admitting: *Deleted

## 2019-10-09 ENCOUNTER — Encounter: Payer: Medicare HMO | Attending: Family Medicine | Admitting: Skilled Nursing Facility1

## 2019-10-09 ENCOUNTER — Encounter: Payer: Self-pay | Admitting: Skilled Nursing Facility1

## 2019-10-09 ENCOUNTER — Other Ambulatory Visit: Payer: Self-pay

## 2019-10-09 DIAGNOSIS — Z713 Dietary counseling and surveillance: Secondary | ICD-10-CM | POA: Diagnosis not present

## 2019-10-09 DIAGNOSIS — E1140 Type 2 diabetes mellitus with diabetic neuropathy, unspecified: Secondary | ICD-10-CM | POA: Diagnosis not present

## 2019-10-09 DIAGNOSIS — E119 Type 2 diabetes mellitus without complications: Secondary | ICD-10-CM

## 2019-10-09 NOTE — Progress Notes (Signed)
Pt states he sleeps with his head elevated to control his GERD. Pt states he has a real problem with restless leg and states he is taking too much gabapentin and wants to drop it back.  Pt states he has done weight watchers about 1 year ago.  Pt states he is up to the 195 pounds due to a loss of mobility. Pt states before he gained the weight he was 175-180 pounds and felt comfortable.  Pt states he does walk with his rolling walker 1/3 of a mile with his neighbor.  Pt states he likes to garden in the summer.  Pt states he is interested on the CBG monitoring system.  Pt states once a week feels tremors, funny in his head, less steady so he checks his blood sugar: 40, 70, 60 so he Eats cottage cheese. Pt states he has a Public house manager come over a couple times a week for cleaning and such. Pt state he likes to bake breads and such.  Pt states he stopped drinking alcohol because it made him unsteady on his feet. Pt states it is hard to cook for one person.  Pt states he is really interested in getting a CBGM system. Dietitian advised she is unsure if his insurance would cover it but he can certainly speak with his endocrinologist on the topic   Education topics: Foods in the carbohydrate category What to do in the event of a low blood sugar Non starchy vegetables and convenient ways to eat them The need for appropriate fluid ounces throughout the day The relationship between health/independance and physical activity such as arm chair exercises   Goals: When you have a low blood sugar eat fast acting sugar such as fruit and recheck 15 minutes after resting if numbers into the 80's then have a meal to keep that number up Set your alarm for getting up every hour maybe get a drink of water Be sure to have protein with your cereal such as cheesy egg Try the frozen bags of vegetables that you put in the microwave "steamers"  Try a snack at 8pm such as cheese toast or cottage cheese with  fruit Fruit is a carbohydrate so it directly affects your blood sugar numbers

## 2019-10-10 ENCOUNTER — Encounter: Payer: Self-pay | Admitting: Family Medicine

## 2019-10-12 ENCOUNTER — Ambulatory Visit: Payer: Medicare HMO | Attending: Internal Medicine

## 2019-10-12 DIAGNOSIS — Z23 Encounter for immunization: Secondary | ICD-10-CM | POA: Insufficient documentation

## 2019-10-12 NOTE — Progress Notes (Signed)
   Covid-19 Vaccination Clinic  Name:  James AVELLINO, James Bell    MRN: [id number] DOB: 09-11-1931  10/12/2019  James Bell was observed post Covid-19 immunization for 15 minutes without incidence. He was provided with Vaccine Information Sheet and instruction to access the V-Safe system.   James Bell was instructed to call 911 with any severe reactions post vaccine: Marland Kitchen Difficulty breathing  . Swelling of your face and throat  . A fast heartbeat  . A bad rash all over your body  . Dizziness and weakness    Immunizations Administered    Name Date Dose VIS Date Route   Pfizer COVID-19 Vaccine 10/12/2019 10:32 AM 0.3 mL 08/29/2019 Intramuscular   Manufacturer: [place]   Lot: [id number]   [place]: [id number]

## 2019-10-17 DIAGNOSIS — L89616 Pressure-induced deep tissue damage of right heel: Secondary | ICD-10-CM | POA: Diagnosis not present

## 2019-10-17 DIAGNOSIS — I739 Peripheral vascular disease, unspecified: Secondary | ICD-10-CM | POA: Diagnosis not present

## 2019-10-17 DIAGNOSIS — M79671 Pain in right foot: Secondary | ICD-10-CM | POA: Diagnosis not present

## 2019-10-17 DIAGNOSIS — E1142 Type 2 diabetes mellitus with diabetic polyneuropathy: Secondary | ICD-10-CM | POA: Diagnosis not present

## 2019-10-17 DIAGNOSIS — S90821A Blister (nonthermal), right foot, initial encounter: Secondary | ICD-10-CM | POA: Diagnosis not present

## 2019-10-20 DIAGNOSIS — H40001 Preglaucoma, unspecified, right eye: Secondary | ICD-10-CM | POA: Diagnosis not present

## 2019-10-22 LAB — HM DIABETES EYE EXAM

## 2019-10-22 MED ORDER — CLONAZEPAM 0.5 MG PO TABS: 0.2500 mg | ORAL_TABLET | Freq: Every evening | ORAL | 0 refills | Status: DC | PRN

## 2019-10-22 NOTE — Addendum Note (Signed)
Addended by: [name] on: 10/22/2019 03:05 PM   Modules accepted: Orders

## 2019-10-24 DIAGNOSIS — S90821A Blister (nonthermal), right foot, initial encounter: Secondary | ICD-10-CM | POA: Diagnosis not present

## 2019-10-24 DIAGNOSIS — E039 Hypothyroidism, unspecified: Secondary | ICD-10-CM | POA: Diagnosis not present

## 2019-10-24 DIAGNOSIS — M79671 Pain in right foot: Secondary | ICD-10-CM | POA: Diagnosis not present

## 2019-10-27 DIAGNOSIS — G4733 Obstructive sleep apnea (adult) (pediatric): Secondary | ICD-10-CM | POA: Diagnosis not present

## 2019-10-28 DIAGNOSIS — H40001 Preglaucoma, unspecified, right eye: Secondary | ICD-10-CM | POA: Diagnosis not present

## 2019-10-29 DIAGNOSIS — E1142 Type 2 diabetes mellitus with diabetic polyneuropathy: Secondary | ICD-10-CM | POA: Diagnosis not present

## 2019-10-29 DIAGNOSIS — I739 Peripheral vascular disease, unspecified: Secondary | ICD-10-CM | POA: Diagnosis not present

## 2019-11-03 ENCOUNTER — Ambulatory Visit: Payer: Medicare HMO | Attending: Internal Medicine

## 2019-11-03 DIAGNOSIS — Z23 Encounter for immunization: Secondary | ICD-10-CM | POA: Insufficient documentation

## 2019-11-03 NOTE — Progress Notes (Signed)
   Covid-19 Vaccination Clinic  Name:  James UHL, MD    MRN: [id number] DOB: 04-21-1931  11/03/2019  James Bell was observed post Covid-19 immunization for 15 minutes without incidence. He was provided with Vaccine Information Sheet and instruction to access the V-Safe system.   James Bell was instructed to call 911 with any severe reactions post vaccine: Marland Kitchen Difficulty breathing  . Swelling of your face and throat  . A fast heartbeat  . A bad rash all over your body  . Dizziness and weakness    Immunizations Administered    Name Date Dose VIS Date Route   Pfizer COVID-19 Vaccine 11/03/2019  9:52 AM 0.3 mL 08/29/2019 Intramuscular   Manufacturer: [place]   Lot: [id number]   [place]: [id number]

## 2019-11-04 ENCOUNTER — Encounter: Payer: Self-pay | Admitting: Family Medicine

## 2019-11-10 DIAGNOSIS — L97512 Non-pressure chronic ulcer of other part of right foot with fat layer exposed: Secondary | ICD-10-CM | POA: Diagnosis not present

## 2019-11-11 ENCOUNTER — Ambulatory Visit (INDEPENDENT_AMBULATORY_CARE_PROVIDER_SITE_OTHER): Payer: Medicare HMO | Admitting: Adult Health

## 2019-11-11 ENCOUNTER — Other Ambulatory Visit: Payer: Self-pay

## 2019-11-11 ENCOUNTER — Encounter: Payer: Self-pay | Admitting: Adult Health

## 2019-11-11 VITALS — BP 125/75 | HR 68 | Temp 96.8°F | Ht 68.0 in | Wt 190.0 lb

## 2019-11-11 DIAGNOSIS — G4733 Obstructive sleep apnea (adult) (pediatric): Secondary | ICD-10-CM | POA: Diagnosis not present

## 2019-11-11 DIAGNOSIS — Z9989 Dependence on other enabling machines and devices: Secondary | ICD-10-CM

## 2019-11-11 NOTE — Progress Notes (Addendum)
PATIENT: James Spurr, MD DOB: 10-07-1930  REASON FOR VISIT: follow up HISTORY FROM: patient  HISTORY OF PRESENT ILLNESS: Today 11/11/19:  James Bell is an 86 year old male with a history of obstructive sleep apnea on CPAP.  His download indicates that he uses machine 27 out of 30 days for compliance of 90%.  He uses machine greater than 4 hours 19 days for compliance of 63%.  On average he uses his machine 5 hours and 31 minutes.  His residual AHI is 1.7 on 9 cm of water with EPR 3.  He does not have a significant leak.  Reports that the machine is working well for him.  He denies any new issues.  Returns today for follow-up  HISTORY 11/04/2018: I reviewed his CPAP compliance data from 09/30/2018 through 10/29/2018, which is a total of 30 days, during which time he used his CPAP every night with percent used days greater than 4 hours at 83%, indicating very good compliance with an average usage of 6 hours and 4 minutes, residual AHI at goal at 1.4 per hour, leak acceptable with the 95th percentile at 16.4 L/m on a pressure of 9 cm with EPR of 3. He reports doing well with CPAP therapy. He is up-to-date with his supplies typically, using nasal pillows. He is reducing Lyrica, which was for RLS, feels like he is having SEs. Has not had Botox in some time, he states. Has had some falls when R leg gave out. Feels like he may need rehab again. Using 4 point cane consistently at home.   REVIEW OF SYSTEMS: Out of a complete 14 system review of symptoms, the patient complains only of the following symptoms, and all other reviewed systems are negative.  ESS 8 FSS 21   ALLERGIES: Allergies  Allergen Reactions  . Gabapentin Other (See Comments)    At high doses - BLURRED VISION, BALLANCE, CONCENTRATION PROBLEMS, RESTLESSNESS, NOT SLEEPING, ? LOST OF STRENGTH, UNSTEADINESS  . Lisinopril Cough    tachycardia  . Nexium [Esomeprazole] Other (See Comments)    Headache  . Niacin And Related  Other (See Comments)    flushing    HOME MEDICATIONS: Outpatient Medications Prior to Visit  Medication Sig Dispense Refill  . Alpha-Lipoic Acid 600 MG CAPS Take 1 capsule (600 mg total) by mouth 2 (two) times daily. 60 capsule 11  . atorvastatin (LIPITOR) 20 MG tablet     . azelastine (ASTELIN) 0.1 % nasal spray Place 1 spray into both nostrils 2 (two) times daily as needed for rhinitis. Use in each nostril as directed 30 mL 6  . capsaicin (ZOSTRIX) 0.025 % cream Apply topically 2 (two) times daily. 60 g 0  . clonazePAM (KLONOPIN) 0.5 MG tablet Take 0.5-1 tablets (0.25-0.5 mg total) by mouth at bedtime as needed (restless legs). 30 tablet 0  . clopidogrel (PLAVIX) 75 MG tablet TAKE 1 TABLET DAILY 90 tablet 3  . Coenzyme Q10 (COQ10) 150 MG CAPS Take 1 capsule by mouth 2 (two) times daily.    . CRESTOR 40 MG tablet TAKE 1 TABLET DAILY 90 tablet 1  . diazepam (VALIUM) 5 MG tablet Take 1 tablet (5 mg total) by mouth every 8 (eight) hours as needed for muscle spasms. 90 tablet 0  . diclofenac sodium (VOLTAREN) 1 % GEL APPLY 2 GRAMS TOPICALLY FOUR TIMES A DAY 300 g 1  . DULoxetine (CYMBALTA) 60 MG capsule TAKE 1 CAPSULE DAILY 90 capsule 3  . famotidine (PEPCID) 20 MG tablet  Take 1 tablet (20 mg total) by mouth at bedtime.    . ferrous sulfate 325 (65 FE) MG tablet Take 1 tablet (325 mg total) by mouth every other day.    Marland Kitchen FOLBEE 2.5-25-1 MG TABS tablet TAKE 1 TABLET DAILY 90 tablet 1  . gabapentin (NEURONTIN) 300 MG capsule Take 2 capsules (600 mg total) by mouth 2 (two) times daily. Pt is taken 8 or so a day    . guaiFENesin-codeine (CHERATUSSIN AC) 100-10 MG/5ML syrup Take 5 mLs by mouth 2 (two) times daily as needed for cough or congestion. 120 mL 0  . Insulin Degludec (TRESIBA FLEXTOUCH) 200 UNIT/ML SOPN Inject 80 Units into the skin at bedtime.     . Insulin Pen Needle (NOVOFINE) 30G X 8 MM MISC Use to inject insulin once daily dx:E11.40 300 each 3  . levothyroxine (LEVOTHROID) 25 MCG  tablet Take 1 tablet (25 mcg total) by mouth daily before breakfast. Per endo [name]    . metoprolol tartrate (LOPRESSOR) 25 MG tablet TAKE ONE-HALF (1/2) TABLET (12.5 MG) AT BEDTIME WITH MORNING DOSE IF NEEDED 90 tablet 2  . metroNIDAZOLE (METROCREAM) 0.75 % cream Apply 1 application topically 3 (three) times daily.    . nitroGLYCERIN (NITROSTAT) 0.4 MG SL tablet DISSOLVE 1 TABLET UNDER THE TONGUE EVERY 5 MINUTES AS NEEDED FOR CHEST PAIN 25 tablet 1  . omega-3 acid ethyl esters (LOVAZA) 1 g capsule Take 2 capsules (2 g total) by mouth 2 (two) times daily. 360 capsule 1  . pramipexole (MIRAPEX) 1 MG tablet Take 1 tablet (1 mg total) by mouth 2 (two) times daily. For RLS 180 tablet 1  . RABEprazole (ACIPHEX) 20 MG tablet TAKE 1 TABLET DAILY 90 tablet 0  . Semaglutide (OZEMPIC) 0.25 or 0.5 MG/DOSE SOPN Inject into the skin.    Marland Kitchen traZODone (DESYREL) 50 MG tablet Take 50 mg by mouth at bedtime as needed for sleep.    . valsartan (DIOVAN) 40 MG tablet TAKE 1 TABLET DAILY 90 tablet 3  . vitamin C (ASCORBIC ACID) 500 MG tablet Take 1,000 mg by mouth daily.      No facility-administered medications prior to visit.    PAST MEDICAL HISTORY: Past Medical History:  Diagnosis Date  . Anemia   . Basal cell carcinoma 2018   [place]   . Basal cell carcinoma of nose 2002   bridge of nose  . BPH with obstruction/lower urinary tract symptoms    failed flomax, uroxatral, myrbetriq - sees urology [name]) pending videourodynamics  . CKD (chronic kidney disease) stage 3, GFR 30-59 ml/min 03/11/2012   Renal US 02/2014 - multiple kidney cysts   . CVA (cerebral infarction) 03/07/12   slurred speech; right hemplegia, left handed - completed PT 07/2014 (17 sessions)  . Depression    mild, on cymbalta per prior PCP  . Erectile dysfunction    per urology (prostaglandin injection)  . GERD (gastroesophageal reflux disease)    with sliding HH  . H/O hiatal hernia   . History of kidney problems 12/2010    lacerated left kidney after fall  . HLD (hyperlipidemia)   . Hx of basal cell carcinoma 07/10/2017   R. lat. forehead near hair line  . Kidney cysts 02/2014   bilateral (renal US)  . Migraines 03/11/12   now rare  . OSA on CPAP    sleep study 01/2014 - severe, CPAP at 10, previously on nuvigil (Dr. [name])  . Palpitations 01/2012   holter  WNL, rare PAC/PVCs  . Presbycusis of both ears 2016   rec amplification [facility])  . Restless leg syndrome    well controlled on mirapex  . Rosacea    [place]  . TIA (transient ischemic attack)   . Type 2 diabetes, controlled, with neuropathy ([place]) 2011   [name] in [place]    PAST SURGICAL HISTORY: Past Surgical History:  Procedure Laterality Date  . ABI  2007   WNL  . CARDIAC CATHETERIZATION  2004   normal; [place]  . CARDIOVASCULAR STRESS TEST  02/2014   WNL, low risk scan, EF 68%  . CAROTID ENDARTERECTOMY    . CATARACT EXTRACTION W/ INTRAOCULAR LENS IMPLANT  11//2012 - 08/2011  . COLONOSCOPY  03/2011   poor prep, unable to biopsy polyp in tic fold, rpt 3 mo [name])  . COLONOSCOPY  06/2011   mult polyps adenomatous, severe diverticulosis, rpt 3 yrs [name])  . CYSTOSCOPY WITH URETHRAL DILATATION N/A 07/24/2013   Procedure: CYSTOSCOPY WITH URETHRAL DILATATION;  Surgeon: [name], MD;  Location: [facility];  Service: Urology;  Laterality: N/A;  . ESOPHAGOGASTRODUODENOSCOPY  2006   duodenitis, medual HH, Grade 2 reflux, mild gastritis  . ESOPHAGOGASTRODUODENOSCOPY  07/2011   mild chronic gastritis  . [place]  . [place]; 2002   left, then repaired  . MRI brain  04/2010   WNL  . PROSTATE SURGERY  02/2009   PVP (laser)  . SKIN CANCER EXCISION  2002   bridge of nose, [facility]  . TONSILLECTOMY AND ADENOIDECTOMY  ~ 1945  . TRANSURETHRAL INCISION OF PROSTATE N/A 07/24/2013   Procedure: TRANSURETHRAL INCISION OF THE PROSTATE (TUIP);  Surgeon: [name], MD;  Location: [facility];  Service:  Urology;  Laterality: N/A;  . URETHRAL DILATION  2014   [place]    FAMILY HISTORY: Family History  Problem Relation Age of Onset  . Stroke Father   . Lung cancer Father 75        smoker  . Stroke Brother   . Diabetes Brother 6  . Lung cancer Brother   . Alcoholism Sister   . Kidney disease Sister   . Coronary artery disease Neg Hx     SOCIAL HISTORY: Social History   Socioeconomic History  . Marital status: Divorced    Spouse name: Not on file  . Number of children: 2  . Years of education: MD  . Highest education level: Not on file  Occupational History  . Occupation: retired    Fish farm manager: RETIRED  Tobacco Use  . Smoking status: Never Smoker  . Smokeless tobacco: Never Used  . Tobacco comment: "used to smoke pipe when I was younger"  Substance and Sexual Activity  . Alcohol use: Yes    Alcohol/week: 4.0 standard drinks    Types: 2 Glasses of wine, 2 Shots of liquor per week    Comment: occasional  . Drug use: No  . Sexual activity: Yes  Other Topics Concern  . Not on file  Social History Narrative   Caffeine: 1 cup coffee/day    Occupation: ophthalmologist, retired   Edu: MD   Activity: going to gym 3x/wk with trainer   Diet: some water, fruits/vegetables some, red meat a few times, fish 1x/wk   POA - [name] (Daughter)   Social Determinants of Health   Financial Resource Strain:   . Difficulty of Paying Living Expenses: Not on file  Food Insecurity:   . Worried  About Running Out of Food in the Last Year: Not on file  . Ran Out of Food in the Last Year: Not on file  Transportation Needs:   . Lack of Transportation (Medical): Not on file  . Lack of Transportation (Non-Medical): Not on file  Physical Activity:   . Days of Exercise per Week: Not on file  . Minutes of Exercise per Session: Not on file  Stress:   . Feeling of Stress : Not on file  Social Connections:   . Frequency of Communication with Friends and Family: Not on file  .  Frequency of Social Gatherings with Friends and Family: Not on file  . Attends Religious Services: Not on file  . Active Member of Clubs or Organizations: Not on file  . Attends Archivist Meetings: Not on file  . Marital Status: Not on file  Intimate Partner Violence:   . Fear of Current or Ex-Partner: Not on file  . Emotionally Abused: Not on file  . Physically Abused: Not on file  . Sexually Abused: Not on file      PHYSICAL EXAM  Vitals:   11/11/19 1105  BP: 125/75  Pulse: 68  Temp: (!) 96.8 F (36 C)  Weight: 190 lb (86.2 kg)  Height: 5\' 8"  (1.727 m)   Body mass index is 28.89 kg/m.  Generalized: Well developed, in no acute distress  Chest: Lungs clear to auscultation bilaterally  Neurological examination  Mentation: Alert oriented to time, place, history taking. Follows all commands speech and language fluent Cranial nerve II-XII: Extraocular movements were full, visual field were full on confrontational test Head turning and shoulder shrug  were normal and symmetric. Motor: The motor testing reveals 5 over 5 strength of all 4 extremities. Good symmetric motor tone is noted throughout.  Boot on the right foot due to a wound Sensory: Sensory testing is intact to soft touch on all 4 extremities. No evidence of extinction is noted.  Gait and station: Gait is normal.    DIAGNOSTIC DATA (LABS, IMAGING, TESTING) - I reviewed patient records, labs, notes, testing and imaging myself where available.  Lab Results  Component Value Date   WBC 7.6 06/21/2019   HGB 13.2 06/21/2019   HCT 39.4 06/21/2019   MCV 89.3 06/21/2019   PLT 185 06/21/2019      Component Value Date/Time   NA 135 06/21/2019 1923   K 4.0 06/21/2019 1923   CL 105 06/21/2019 1923   CO2 24 06/21/2019 1923   GLUCOSE 208 (H) 06/21/2019 1923   BUN 32 (H) 06/21/2019 1923   CREATININE 1.31 (H) 06/21/2019 1923   CALCIUM 8.7 (L) 06/21/2019 1923   PROT 7.0 06/21/2019 1923   ALBUMIN 3.6  06/21/2019 1923   AST 20 06/21/2019 1923   ALT 17 06/21/2019 1923   ALKPHOS 47 06/21/2019 1923   BILITOT 0.7 06/21/2019 1923   GFRNONAA 49 (L) 06/21/2019 1923   GFRAA 57 (L) 06/21/2019 1923   Lab Results  Component Value Date   CHOL 87 01/23/2019   HDL 17.80 (L) 01/23/2019   LDLCALC 33 01/23/2019   LDLDIRECT 78.0 10/30/2018   TRIG 183.0 (H) 01/23/2019   CHOLHDL 5 01/23/2019   Lab Results  Component Value Date   HGBA1C 7.9 08/21/2019   Lab Results  Component Value Date   VITAMINB12 >1500 (H) 07/18/2017   Lab Results  Component Value Date   TSH 4.91 08/21/2019      ASSESSMENT AND PLAN 86 y.o. year old  male  has a past medical history of Anemia, Basal cell carcinoma (2018), Basal cell carcinoma of nose (2002), BPH with obstruction/lower urinary tract symptoms, CKD (chronic kidney disease) stage 3, GFR 30-59 ml/min (03/11/2012), CVA (cerebral infarction) (03/07/12), Depression, Erectile dysfunction, GERD (gastroesophageal reflux disease), H/O hiatal hernia, History of kidney problems (12/2010), HLD (hyperlipidemia), basal cell carcinoma (07/10/2017), Kidney cysts (02/2014), Migraines (03/11/12), OSA on CPAP, Palpitations (01/2012), Presbycusis of both ears (2016), Restless leg syndrome, Rosacea, TIA (transient ischemic attack), and Type 2 diabetes, controlled, with neuropathy ([place]) (2011). here with:  1.  Obstructive sleep apnea on CPAP  -Suboptimal compliance- used >4 hours only 19/30 days (63 %) -Encouraged to use the machine the entire time he is sleeping -Advised if his symptoms worsen or he develops new symptoms he should let us know. -Follow-up in 1 year or sooner if needed  I spent 15 minutes with the patient. 50% of this time was spent reviewing CPAP download   James Givens, MSN, NP-C 11/11/2019, 11:13 AM [facility] [address] [place], [place] [address] [phone]  I reviewed the above note and documentation by the Nurse Practitioner  and agree with the history, exam, assessment and plan as outlined above. I was available for consultation. Star Age, MD, PhD [facility])

## 2019-11-11 NOTE — Patient Instructions (Signed)
Continue using CPAP nightly and greater than 4 hours each night °If your symptoms worsen or you develop new symptoms please let us know.  ° °

## 2019-11-24 DIAGNOSIS — G4733 Obstructive sleep apnea (adult) (pediatric): Secondary | ICD-10-CM | POA: Diagnosis not present

## 2019-12-01 DIAGNOSIS — L97512 Non-pressure chronic ulcer of other part of right foot with fat layer exposed: Secondary | ICD-10-CM | POA: Diagnosis not present

## 2019-12-05 DIAGNOSIS — E11621 Type 2 diabetes mellitus with foot ulcer: Secondary | ICD-10-CM | POA: Diagnosis not present

## 2019-12-05 DIAGNOSIS — E1142 Type 2 diabetes mellitus with diabetic polyneuropathy: Secondary | ICD-10-CM | POA: Diagnosis not present

## 2019-12-05 DIAGNOSIS — E039 Hypothyroidism, unspecified: Secondary | ICD-10-CM | POA: Diagnosis not present

## 2019-12-05 DIAGNOSIS — Z794 Long term (current) use of insulin: Secondary | ICD-10-CM | POA: Diagnosis not present

## 2019-12-05 DIAGNOSIS — E785 Hyperlipidemia, unspecified: Secondary | ICD-10-CM | POA: Diagnosis not present

## 2019-12-05 LAB — LIPID PANEL
Cholesterol: 144 (ref 0–200)
HDL: 16 — AB (ref 35–70)
LDL Cholesterol: 53
Triglycerides: 497 — AB (ref 40–160)

## 2019-12-05 LAB — COMPREHENSIVE METABOLIC PANEL: Albumin: 4 (ref 3.5–5.0)

## 2019-12-05 LAB — HEPATIC FUNCTION PANEL
ALT: 16 (ref 10–40)
AST: 20 (ref 14–40)
Alkaline Phosphatase: 44 (ref 25–125)
Bilirubin, Total: 0.3

## 2019-12-05 LAB — BASIC METABOLIC PANEL
Creatinine: 1.4 — AB (ref 0.6–1.3)
Potassium: 4 (ref 3.4–5.3)
Sodium: 139 (ref 137–147)

## 2019-12-05 LAB — HEMOGLOBIN A1C: Hemoglobin A1C: 8.6

## 2019-12-05 LAB — TSH: TSH: 3.09 (ref 0.41–5.90)

## 2019-12-07 ENCOUNTER — Other Ambulatory Visit: Payer: Self-pay | Admitting: Family Medicine

## 2019-12-07 MED ORDER — ALPHA-LIPOIC ACID 600 MG PO CAPS: 1.0000 | ORAL_CAPSULE | Freq: Every day | ORAL | 11 refills | Status: DC

## 2019-12-08 ENCOUNTER — Other Ambulatory Visit: Payer: Self-pay

## 2019-12-08 MED ORDER — RABEPRAZOLE SODIUM 20 MG PO TBEC: 20.0000 mg | DELAYED_RELEASE_TABLET | Freq: Every day | ORAL | 1 refills | Status: DC

## 2019-12-08 MED ORDER — GABAPENTIN 300 MG PO CAPS: 600.0000 mg | ORAL_CAPSULE | Freq: Four times a day (QID) | ORAL | 0 refills | Status: DC

## 2019-12-08 MED ORDER — GABAPENTIN 300 MG PO CAPS: 600.0000 mg | ORAL_CAPSULE | Freq: Four times a day (QID) | ORAL | 1 refills | Status: DC

## 2019-12-08 MED ORDER — GABAPENTIN 300 MG PO CAPS: 600.0000 mg | ORAL_CAPSULE | Freq: Two times a day (BID) | ORAL | 1 refills | Status: DC

## 2019-12-08 NOTE — Addendum Note (Signed)
Addended by: [name] on: 12/08/2019 06:52 PM   Modules accepted: Orders

## 2019-12-08 NOTE — Telephone Encounter (Signed)
plz verify that he's taking 300mg  QID.  Also plz verify what pharmacy he wants this to ([facility] he have enough to last him?)

## 2019-12-08 NOTE — Telephone Encounter (Signed)
Spoke with pt to clarify how he's taking gabapentin.  Pt states he takes 2- 300 mg (600 mg total) caps QID.  Requests refill be sent to [facility] but also needs a quick refill sent to [facility].  Also requests refill for Aciphex sent to [facility].

## 2019-12-08 NOTE — Telephone Encounter (Signed)
Pt left v/m that pt is taking neurontin 4 x a day instead of 3 x a day. Pt is almost out of med. Pt last seen 09/30/19 for 3 mth FU and pt has next appt 01/13/20 for 3 mth appt. On hx med list Dr Tomi Likens last filled neurontin 300 mg # 360 x 1 on 03/11/19. Please advise.

## 2019-12-18 ENCOUNTER — Telehealth: Payer: Self-pay

## 2019-12-18 DIAGNOSIS — E1165 Type 2 diabetes mellitus with hyperglycemia: Secondary | ICD-10-CM | POA: Diagnosis not present

## 2019-12-18 NOTE — Telephone Encounter (Signed)
Pt needs letter of renewal for the post office to continue receiving his mail at his front door instead of at the road. He will pick it up when ready. Call when ready [phone].

## 2019-12-18 NOTE — Telephone Encounter (Signed)
Letter written and in [name]'s box.  

## 2019-12-18 NOTE — Telephone Encounter (Signed)
Spoke with pt notifying him the letter is ready to pick up.  He will pick up today.   [Placed letter at front office- yellow folders.]

## 2019-12-22 DIAGNOSIS — L97512 Non-pressure chronic ulcer of other part of right foot with fat layer exposed: Secondary | ICD-10-CM | POA: Diagnosis not present

## 2019-12-22 DIAGNOSIS — I739 Peripheral vascular disease, unspecified: Secondary | ICD-10-CM | POA: Diagnosis not present

## 2019-12-22 DIAGNOSIS — M79671 Pain in right foot: Secondary | ICD-10-CM | POA: Diagnosis not present

## 2020-01-02 DIAGNOSIS — G4733 Obstructive sleep apnea (adult) (pediatric): Secondary | ICD-10-CM | POA: Diagnosis not present

## 2020-01-04 ENCOUNTER — Other Ambulatory Visit: Payer: Self-pay | Admitting: Family Medicine

## 2020-01-05 ENCOUNTER — Other Ambulatory Visit: Payer: Self-pay | Admitting: Family Medicine

## 2020-01-10 ENCOUNTER — Other Ambulatory Visit: Payer: Self-pay | Admitting: Family Medicine

## 2020-01-13 ENCOUNTER — Other Ambulatory Visit: Payer: Self-pay

## 2020-01-13 ENCOUNTER — Encounter: Payer: Self-pay | Admitting: Family Medicine

## 2020-01-13 ENCOUNTER — Ambulatory Visit (INDEPENDENT_AMBULATORY_CARE_PROVIDER_SITE_OTHER): Payer: Medicare HMO | Admitting: Family Medicine

## 2020-01-13 VITALS — BP 124/66 | HR 74 | Temp 97.8°F | Ht 68.0 in | Wt 192.3 lb

## 2020-01-13 DIAGNOSIS — E1142 Type 2 diabetes mellitus with diabetic polyneuropathy: Secondary | ICD-10-CM

## 2020-01-13 DIAGNOSIS — I69398 Other sequelae of cerebral infarction: Secondary | ICD-10-CM

## 2020-01-13 DIAGNOSIS — E1165 Type 2 diabetes mellitus with hyperglycemia: Secondary | ICD-10-CM | POA: Diagnosis not present

## 2020-01-13 DIAGNOSIS — N183 Chronic kidney disease, stage 3 unspecified: Secondary | ICD-10-CM

## 2020-01-13 DIAGNOSIS — E1122 Type 2 diabetes mellitus with diabetic chronic kidney disease: Secondary | ICD-10-CM | POA: Diagnosis not present

## 2020-01-13 DIAGNOSIS — I69351 Hemiplegia and hemiparesis following cerebral infarction affecting right dominant side: Secondary | ICD-10-CM | POA: Diagnosis not present

## 2020-01-13 DIAGNOSIS — L97519 Non-pressure chronic ulcer of other part of right foot with unspecified severity: Secondary | ICD-10-CM

## 2020-01-13 DIAGNOSIS — G8110 Spastic hemiplegia affecting unspecified side: Secondary | ICD-10-CM

## 2020-01-13 DIAGNOSIS — Z9989 Dependence on other enabling machines and devices: Secondary | ICD-10-CM

## 2020-01-13 DIAGNOSIS — Z7409 Other reduced mobility: Secondary | ICD-10-CM

## 2020-01-13 DIAGNOSIS — G4733 Obstructive sleep apnea (adult) (pediatric): Secondary | ICD-10-CM | POA: Diagnosis not present

## 2020-01-13 DIAGNOSIS — G2581 Restless legs syndrome: Secondary | ICD-10-CM | POA: Diagnosis not present

## 2020-01-13 DIAGNOSIS — R269 Unspecified abnormalities of gait and mobility: Secondary | ICD-10-CM

## 2020-01-13 DIAGNOSIS — E11621 Type 2 diabetes mellitus with foot ulcer: Secondary | ICD-10-CM

## 2020-01-13 DIAGNOSIS — E1140 Type 2 diabetes mellitus with diabetic neuropathy, unspecified: Secondary | ICD-10-CM

## 2020-01-13 DIAGNOSIS — IMO0002 Reserved for concepts with insufficient information to code with codable children: Secondary | ICD-10-CM

## 2020-01-13 LAB — POCT GLYCOSYLATED HEMOGLOBIN (HGB A1C): Hemoglobin A1C: 8.2 % — AB (ref 4.0–5.6)

## 2020-01-13 MED ORDER — CLOPIDOGREL BISULFATE 75 MG PO TABS: 75.0000 mg | ORAL_TABLET | Freq: Every day | ORAL | 3 refills | Status: DC

## 2020-01-13 NOTE — Assessment & Plan Note (Signed)
Uses quad cane.

## 2020-01-13 NOTE — Patient Instructions (Addendum)
Try ferrous sulfate 325mg  (65 FE) every other day for restless leg symptoms - trial this for 1 month.  Continue other medicines as up to now.  Return as needed or in 3-4 months for medicare wellness and physical We will refer you to physical therapy.

## 2020-01-13 NOTE — Assessment & Plan Note (Signed)
Now on gabapentin 600mg  QID and alpha lipoid acid with benefit.

## 2020-01-13 NOTE — Assessment & Plan Note (Addendum)
Overall improvement since starting alpha lipoic acid. He didn't find klonopin effective. Now managing with mirapex 2mg  nightly (still higher dose than routinely used, but lower than he was previously on), as well as gabapentin 600mg  QID. He has not tried iron replacement - suggested  Month trial of QOD iron replacement, may stop if no benefit noted.

## 2020-01-13 NOTE — Assessment & Plan Note (Signed)
Appreciate endo care. Humulin recently added.

## 2020-01-13 NOTE — Assessment & Plan Note (Signed)
Pt reports recent good report by neurology. Reviewing note, they recommended increased CPAP use.

## 2020-01-13 NOTE — Progress Notes (Signed)
This visit was conducted in person.  BP 124/66 (BP Location: Left Arm, Patient Position: Sitting, Cuff Size: Normal)   Pulse 74   Temp 97.8 F (36.6 C) (Temporal)   Ht 5\' 8"  (1.727 m)   Wt 192 lb 5 oz (87.2 kg)   SpO2 98%   BMI 29.24 kg/m    CC: 3 mo f/u visit  Subjective:    Patient ID: James Spurr, MD, male    DOB: 08-27-1931, 86 y.o.   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  Seeing podiatry for R plantar foot ulcer of skin (burned on heating pad). This is almost fully healed.  RLS - concern for augmentation with neurontin 600mg  TID AC and HS and mirapex 2mg  BID - progressively worsening symptoms despite high dose. We tried clonazepam 0.5mg  nightly PRN RLS - no significant benefit with this. He feels alpha lipoic acid has helped RLS more. Has not started OTC iron   Not using trazodone - sleeping ok.  Notes worsening R leg weakness. Has not been walking as much due to heel wound. Asks about outpatient PT [facility]). H/o R hemiparesis after CVA.   DM - continues seeing [name] - now using freestyle CGM. cbg 227 this past week. No noted signs of infection. Continues tresiba 80u qhs, ozempic 0.5mg  weekly. Started on humulin 6u with meals - forgets this at times.  Lab Results  Component Value Date   HGBA1C 8.2 (A) 01/13/2020  He has recently seen the nutritionist (09/2019)       Relevant past medical, surgical, family and social history reviewed and updated as indicated. Interim medical history since our last visit reviewed. Allergies and medications reviewed and updated. Outpatient Medications Prior to Visit  Medication Sig Dispense Refill  . Alpha-Lipoic Acid 600 MG CAPS Take 1 capsule (600 mg total) by mouth daily. 30 capsule 11  . atorvastatin (LIPITOR) 20 MG tablet     . azelastine (ASTELIN) 0.1 % nasal spray  Place 1 spray into both nostrils 2 (two) times daily as needed for rhinitis. Use in each nostril as directed 30 mL 6  . BD PEN NEEDLE NANO 2ND GEN 32G X 4 MM MISC     . capsaicin (ZOSTRIX) 0.025 % cream Apply topically 2 (two) times daily. 60 g 0  . Coenzyme Q10 (COQ10) 150 MG CAPS Take 1 capsule by mouth 2 (two) times daily.    . CRESTOR 40 MG tablet TAKE 1 TABLET DAILY 90 tablet 1  . diazepam (VALIUM) 5 MG tablet Take 1 tablet (5 mg total) by mouth every 8 (eight) hours as needed for muscle spasms. 90 tablet 0  . diclofenac sodium (VOLTAREN) 1 % GEL APPLY 2 GRAMS TOPICALLY FOUR TIMES A DAY 300 g 1  . DULoxetine (CYMBALTA) 60 MG capsule TAKE 1 CAPSULE DAILY 90 capsule 0  . famotidine (PEPCID) 20 MG tablet Take 1 tablet (20 mg total) by mouth at bedtime.    . ferrous sulfate 325 (65 FE) MG tablet Take 1 tablet (325 mg total) by mouth every other day.    [place] FOLBEE 2.5-25-1 MG TABS tablet TAKE 1 TABLET DAILY 90 tablet 1  . gabapentin (NEURONTIN) 300 MG capsule Take 2 capsules (600 mg total) by mouth 4 (four) times daily. 720 capsule 1  . guaiFENesin-codeine (CHERATUSSIN AC)  100-10 MG/5ML syrup Take 5 mLs by mouth 2 (two) times daily as needed for cough or congestion. 120 mL 0  . HUMALOG KWIKPEN 100 UNIT/ML KwikPen Inject 8 Units into the skin. After a large meal    . insulin degludec (TRESIBA FLEXTOUCH) 200 UNIT/ML FlexTouch Pen Inject 90 Units into the skin at bedtime.    . Insulin Pen Needle (NOVOFINE) 30G X 8 MM MISC Use to inject insulin once daily dx:E11.40 300 each 3  . levothyroxine (SYNTHROID) 50 MCG tablet Take 1 tablet (50 mcg total) by mouth daily before breakfast. Per endo [name]    . metoprolol tartrate (LOPRESSOR) 25 MG tablet TAKE ONE-HALF (1/2) TABLET (12.5 MG) AT BEDTIME WITH MORNING DOSE IF NEEDED 90 tablet 2  . metroNIDAZOLE (METROCREAM) 0.75 % cream Apply 1 application topically 3 (three) times daily.    . nitroGLYCERIN (NITROSTAT) 0.4 MG SL tablet DISSOLVE 1 TABLET UNDER THE  TONGUE EVERY 5 MINUTES AS NEEDED FOR CHEST PAIN 25 tablet 1  . omega-3 acid ethyl esters (LOVAZA) 1 g capsule Take 2 capsules (2 g total) by mouth 2 (two) times daily. 360 capsule 1  . pramipexole (MIRAPEX) 1 MG tablet Take 1 tablet (1 mg total) by mouth 2 (two) times daily. For RLS 180 tablet 1  . RABEprazole (ACIPHEX) 20 MG tablet Take 1 tablet (20 mg total) by mouth daily. 90 tablet 1  . Semaglutide (OZEMPIC) 0.25 or 0.5 MG/DOSE SOPN Inject into the skin.    [place] traZODone (DESYREL) 50 MG tablet Take 50 mg by mouth at bedtime as needed for sleep.    . valsartan (DIOVAN) 40 MG tablet TAKE 1 TABLET DAILY 90 tablet 3  . vitamin C (ASCORBIC ACID) 500 MG tablet Take 1,000 mg by mouth daily.     . clonazePAM (KLONOPIN) 0.5 MG tablet Take 0.5-1 tablets (0.25-0.5 mg total) by mouth at bedtime as needed (restless legs). 30 tablet 0  . clopidogrel (PLAVIX) 75 MG tablet TAKE 1 TABLET DAILY 90 tablet 3  . gabapentin (NEURONTIN) 300 MG capsule Take 2 capsules (600 mg total) by mouth 4 (four) times daily. 240 capsule 0  . Insulin Degludec (TRESIBA FLEXTOUCH) 200 UNIT/ML SOPN Inject 80 Units into the skin at bedtime.     . insulin regular (HUMULIN R) 100 units/mL injection Inject 0.06 mLs (6 Units total) into the skin 3 (three) times daily before meals.     No facility-administered medications prior to visit.     Per HPI unless specifically indicated in ROS section below Review of Systems Objective:    BP 124/66 (BP Location: Left Arm, Patient Position: Sitting, Cuff Size: Normal)   Pulse 74   Temp 97.8 F (36.6 C) (Temporal)   Ht 5\' 8"  (1.727 m)   Wt 192 lb 5 oz (87.2 kg)   SpO2 98%   BMI 29.24 kg/m   Wt Readings from Last 3 Encounters:  01/13/20 192 lb 5 oz (87.2 kg)  11/11/19 190 lb (86.2 kg)  09/30/19 198 lb (89.8 kg)    Physical Exam Vitals and nursing note reviewed.  Constitutional:      Appearance: Normal appearance. He is not ill-appearing.     Comments: Walks with quad cane   Cardiovascular:     Rate and Rhythm: Normal rate and regular rhythm.     Pulses: Normal pulses.     Heart sounds: Normal heart sounds. No murmur.  Pulmonary:     Effort: Pulmonary effort is normal. No respiratory distress.  Breath sounds: Normal breath sounds. No wheezing, rhonchi or rales.  Musculoskeletal:     Right lower leg: Edema (tr-1+) present.     Left lower leg: Edema (tr-1+) present.  Skin:    General: Skin is warm and dry.     Findings: No rash.  Neurological:     Mental Status: He is alert.     Comments:  5/5 strength BLE except 4/5 hip flexors and knee extension on right Needs to use arm rests to get up out of chair.   Psychiatric:        Mood and Affect: Mood normal.        Behavior: Behavior normal.       Results for orders placed or performed in visit on 01/13/20  POCT glycosylated hemoglobin (Hb A1C)  Result Value Ref Range   Hemoglobin A1C 8.2 (A) 4.0 - 5.6 %   HbA1c POC (<> result, manual entry)     HbA1c, POC (prediabetic range)     HbA1c, POC (controlled diabetic range)     Assessment & Plan:  This visit occurred during the SARS-CoV-2 public health emergency.  Safety protocols were in place, including screening questions prior to the visit, additional usage of staff PPE, and extensive cleaning of exam room while observing appropriate contact time as indicated for disinfecting solutions.   Problem List Items Addressed This Visit    Type 2 diabetes mellitus, uncontrolled, with neuropathy ([place])    Appreciate endo care. Humulin recently added.      Relevant Medications   HUMALOG KWIKPEN 100 UNIT/ML KwikPen   insulin regular (HUMULIN R) 100 units/mL injection   insulin degludec (TRESIBA FLEXTOUCH) 200 UNIT/ML FlexTouch Pen   Other Relevant Orders   POCT glycosylated hemoglobin (Hb A1C) (Completed)   Spastic hemiparesis affecting dominant side (HCC)   Relevant Orders   Ambulatory referral to Physical Therapy   Restless leg syndrome - Primary     Overall improvement since starting alpha lipoic acid. He didn't find klonopin effective. Now managing with mirapex 2mg  nightly (still higher dose than routinely used, but lower than he was previously on), as well as gabapentin 600mg  QID. He has not tried iron replacement - suggested  Month trial of QOD iron replacement, may stop if no benefit noted.       Polyneuropathy due to type 2 diabetes mellitus ([place])    Now on gabapentin 600mg  QID and alpha lipoid acid with benefit.       Relevant Medications   HUMALOG KWIKPEN 100 UNIT/ML KwikPen   insulin regular (HUMULIN R) 100 units/mL injection   insulin degludec (TRESIBA FLEXTOUCH) 200 UNIT/ML FlexTouch Pen   Other Relevant Orders   Ambulatory referral to Physical Therapy   OSA on CPAP    Pt reports recent good report by neurology. Reviewing note, they recommended increased CPAP use.       Impaired mobility    Uses quad cane.       Relevant Orders   Ambulatory referral to Physical Therapy   Hemiparesis affecting right side as late effect of stroke (HCC)    Residual. Continues plavix alone.  Concern for worsening R sided weakness after prolonged sedentary period while R heel ulcer healed. He is interested in outpatient physical therapy to work on stamina/conditioning       Relevant Orders   Ambulatory referral to Physical Therapy   Gait disturbance, post-stroke   Relevant Orders   Ambulatory referral to Physical Therapy   CKD stage 3 secondary to diabetes (  [place])    He is now using app on phone to remind him to drink every 10 min - anticipate improved insufficiency due to this. Check when he returns for CPE.       Relevant Medications   HUMALOG KWIKPEN 100 UNIT/ML KwikPen   insulin regular (HUMULIN R) 100 units/mL injection   insulin degludec (TRESIBA FLEXTOUCH) 200 UNIT/ML FlexTouch Pen    Other Visit Diagnoses    Type 2 diabetes mellitus with right diabetic foot ulcer (HCC)       Relevant Medications   HUMALOG KWIKPEN 100  UNIT/ML KwikPen   insulin regular (HUMULIN R) 100 units/mL injection   insulin degludec (TRESIBA FLEXTOUCH) 200 UNIT/ML FlexTouch Pen   Other Relevant Orders   Ambulatory referral to Physical Therapy       Meds ordered this encounter  Medications  . clopidogrel (PLAVIX) 75 MG tablet    Sig: Take 1 tablet (75 mg total) by mouth daily.    Dispense:  90 tablet    Refill:  3   Orders Placed This Encounter  Procedures  . Ambulatory referral to Physical Therapy    Referral Priority:   Routine    Referral Type:   Physical Medicine    Referral Reason:   Specialty Services Required    Requested Specialty:   Physical Therapy    Number of Visits Requested:   1  . POCT glycosylated hemoglobin (Hb A1C)    Follow up plan: Return in about 3 months (around 04/13/2020) for medicare wellness visit, annual exam, prior fasting for blood work.  [name], MD

## 2020-01-13 NOTE — Assessment & Plan Note (Addendum)
Residual. Continues plavix alone.  Concern for worsening R sided weakness after prolonged sedentary period while R heel ulcer healed. He is interested in outpatient physical therapy to work on stamina/conditioning

## 2020-01-13 NOTE — Assessment & Plan Note (Signed)
He is now using app on phone to remind him to drink every 10 min - anticipate improved insufficiency due to this. Check when he returns for CPE.

## 2020-01-15 ENCOUNTER — Other Ambulatory Visit: Payer: Self-pay

## 2020-01-15 MED ORDER — GABAPENTIN 300 MG PO CAPS: 600.0000 mg | ORAL_CAPSULE | Freq: Four times a day (QID) | ORAL | 1 refills | Status: DC

## 2020-01-15 NOTE — Telephone Encounter (Signed)
Received faxed refill request from [facility]. Gabapentin Last rx:  12/08/19, #720 Last OV:  01/13/20, 3-4 mo f/u Next OV:  04/22/20, AWV prt 2

## 2020-01-17 DIAGNOSIS — E1165 Type 2 diabetes mellitus with hyperglycemia: Secondary | ICD-10-CM | POA: Diagnosis not present

## 2020-01-20 DIAGNOSIS — R2681 Unsteadiness on feet: Secondary | ICD-10-CM | POA: Diagnosis not present

## 2020-01-20 DIAGNOSIS — R2689 Other abnormalities of gait and mobility: Secondary | ICD-10-CM | POA: Diagnosis not present

## 2020-01-22 DIAGNOSIS — R2689 Other abnormalities of gait and mobility: Secondary | ICD-10-CM | POA: Diagnosis not present

## 2020-01-22 DIAGNOSIS — R2681 Unsteadiness on feet: Secondary | ICD-10-CM | POA: Diagnosis not present

## 2020-01-27 DIAGNOSIS — R2681 Unsteadiness on feet: Secondary | ICD-10-CM | POA: Diagnosis not present

## 2020-01-27 DIAGNOSIS — R2689 Other abnormalities of gait and mobility: Secondary | ICD-10-CM | POA: Diagnosis not present

## 2020-01-29 DIAGNOSIS — R2689 Other abnormalities of gait and mobility: Secondary | ICD-10-CM | POA: Diagnosis not present

## 2020-01-29 DIAGNOSIS — R2681 Unsteadiness on feet: Secondary | ICD-10-CM | POA: Diagnosis not present

## 2020-02-01 DIAGNOSIS — G4733 Obstructive sleep apnea (adult) (pediatric): Secondary | ICD-10-CM | POA: Diagnosis not present

## 2020-02-02 DIAGNOSIS — W19XXXA Unspecified fall, initial encounter: Secondary | ICD-10-CM | POA: Diagnosis not present

## 2020-02-02 DIAGNOSIS — E039 Hypothyroidism, unspecified: Secondary | ICD-10-CM | POA: Diagnosis not present

## 2020-02-02 DIAGNOSIS — I6930 Unspecified sequelae of cerebral infarction: Secondary | ICD-10-CM | POA: Diagnosis not present

## 2020-02-02 DIAGNOSIS — Z794 Long term (current) use of insulin: Secondary | ICD-10-CM | POA: Diagnosis not present

## 2020-02-02 DIAGNOSIS — E1142 Type 2 diabetes mellitus with diabetic polyneuropathy: Secondary | ICD-10-CM | POA: Diagnosis not present

## 2020-02-02 DIAGNOSIS — E785 Hyperlipidemia, unspecified: Secondary | ICD-10-CM | POA: Diagnosis not present

## 2020-02-03 DIAGNOSIS — R2681 Unsteadiness on feet: Secondary | ICD-10-CM | POA: Diagnosis not present

## 2020-02-03 DIAGNOSIS — R2689 Other abnormalities of gait and mobility: Secondary | ICD-10-CM | POA: Diagnosis not present

## 2020-02-05 DIAGNOSIS — R2681 Unsteadiness on feet: Secondary | ICD-10-CM | POA: Diagnosis not present

## 2020-02-05 DIAGNOSIS — R2689 Other abnormalities of gait and mobility: Secondary | ICD-10-CM | POA: Diagnosis not present

## 2020-02-10 DIAGNOSIS — R2689 Other abnormalities of gait and mobility: Secondary | ICD-10-CM | POA: Diagnosis not present

## 2020-02-10 DIAGNOSIS — R2681 Unsteadiness on feet: Secondary | ICD-10-CM | POA: Diagnosis not present

## 2020-02-12 DIAGNOSIS — R2681 Unsteadiness on feet: Secondary | ICD-10-CM | POA: Diagnosis not present

## 2020-02-12 DIAGNOSIS — R2689 Other abnormalities of gait and mobility: Secondary | ICD-10-CM | POA: Diagnosis not present

## 2020-02-16 DIAGNOSIS — E1165 Type 2 diabetes mellitus with hyperglycemia: Secondary | ICD-10-CM | POA: Diagnosis not present

## 2020-02-19 DIAGNOSIS — R2689 Other abnormalities of gait and mobility: Secondary | ICD-10-CM | POA: Diagnosis not present

## 2020-02-19 DIAGNOSIS — R2681 Unsteadiness on feet: Secondary | ICD-10-CM | POA: Diagnosis not present

## 2020-02-26 DIAGNOSIS — R2681 Unsteadiness on feet: Secondary | ICD-10-CM | POA: Diagnosis not present

## 2020-02-26 DIAGNOSIS — R2689 Other abnormalities of gait and mobility: Secondary | ICD-10-CM | POA: Diagnosis not present

## 2020-03-02 DIAGNOSIS — R2681 Unsteadiness on feet: Secondary | ICD-10-CM | POA: Diagnosis not present

## 2020-03-02 DIAGNOSIS — R2689 Other abnormalities of gait and mobility: Secondary | ICD-10-CM | POA: Diagnosis not present

## 2020-03-03 DIAGNOSIS — G4733 Obstructive sleep apnea (adult) (pediatric): Secondary | ICD-10-CM | POA: Diagnosis not present

## 2020-03-05 DIAGNOSIS — I6930 Unspecified sequelae of cerebral infarction: Secondary | ICD-10-CM | POA: Diagnosis not present

## 2020-03-05 DIAGNOSIS — E1142 Type 2 diabetes mellitus with diabetic polyneuropathy: Secondary | ICD-10-CM | POA: Diagnosis not present

## 2020-03-05 DIAGNOSIS — Z794 Long term (current) use of insulin: Secondary | ICD-10-CM | POA: Diagnosis not present

## 2020-03-05 DIAGNOSIS — E785 Hyperlipidemia, unspecified: Secondary | ICD-10-CM | POA: Diagnosis not present

## 2020-03-05 DIAGNOSIS — E039 Hypothyroidism, unspecified: Secondary | ICD-10-CM | POA: Diagnosis not present

## 2020-03-10 ENCOUNTER — Other Ambulatory Visit: Payer: Self-pay | Admitting: Family Medicine

## 2020-03-10 NOTE — Progress Notes (Signed)
Updated meds per endo note.

## 2020-03-11 ENCOUNTER — Other Ambulatory Visit: Payer: Self-pay | Admitting: Family Medicine

## 2020-03-16 DIAGNOSIS — R2681 Unsteadiness on feet: Secondary | ICD-10-CM | POA: Diagnosis not present

## 2020-03-16 DIAGNOSIS — R2689 Other abnormalities of gait and mobility: Secondary | ICD-10-CM | POA: Diagnosis not present

## 2020-03-18 DIAGNOSIS — R2681 Unsteadiness on feet: Secondary | ICD-10-CM | POA: Diagnosis not present

## 2020-03-18 DIAGNOSIS — R2689 Other abnormalities of gait and mobility: Secondary | ICD-10-CM | POA: Diagnosis not present

## 2020-03-23 DIAGNOSIS — R2689 Other abnormalities of gait and mobility: Secondary | ICD-10-CM | POA: Diagnosis not present

## 2020-03-23 DIAGNOSIS — R2681 Unsteadiness on feet: Secondary | ICD-10-CM | POA: Diagnosis not present

## 2020-03-30 ENCOUNTER — Other Ambulatory Visit: Payer: Self-pay | Admitting: Family Medicine

## 2020-03-30 NOTE — Telephone Encounter (Signed)
Mirapex Last filled:  4//2, #180 Last OV:  01/13/20, 3-4 mo f/u Next OV:  8//21, AWV prt 2

## 2020-04-01 DIAGNOSIS — Z9842 Cataract extraction status, left eye: Secondary | ICD-10-CM | POA: Diagnosis not present

## 2020-04-01 DIAGNOSIS — H0288B Meibomian gland dysfunction left eye, upper and lower eyelids: Secondary | ICD-10-CM | POA: Diagnosis not present

## 2020-04-01 DIAGNOSIS — Z9841 Cataract extraction status, right eye: Secondary | ICD-10-CM | POA: Diagnosis not present

## 2020-04-01 DIAGNOSIS — H5052 Exophoria: Secondary | ICD-10-CM | POA: Diagnosis not present

## 2020-04-01 DIAGNOSIS — E119 Type 2 diabetes mellitus without complications: Secondary | ICD-10-CM | POA: Diagnosis not present

## 2020-04-01 DIAGNOSIS — H5203 Hypermetropia, bilateral: Secondary | ICD-10-CM | POA: Diagnosis not present

## 2020-04-01 DIAGNOSIS — H40053 Ocular hypertension, bilateral: Secondary | ICD-10-CM | POA: Diagnosis not present

## 2020-04-01 LAB — HM DIABETES EYE EXAM

## 2020-04-04 ENCOUNTER — Other Ambulatory Visit: Payer: Self-pay | Admitting: Family Medicine

## 2020-04-06 DIAGNOSIS — H903 Sensorineural hearing loss, bilateral: Secondary | ICD-10-CM | POA: Diagnosis not present

## 2020-04-06 DIAGNOSIS — R2689 Other abnormalities of gait and mobility: Secondary | ICD-10-CM | POA: Diagnosis not present

## 2020-04-06 DIAGNOSIS — R2681 Unsteadiness on feet: Secondary | ICD-10-CM | POA: Diagnosis not present

## 2020-04-08 ENCOUNTER — Encounter: Payer: Self-pay | Admitting: Family Medicine

## 2020-04-08 DIAGNOSIS — R2681 Unsteadiness on feet: Secondary | ICD-10-CM | POA: Diagnosis not present

## 2020-04-08 DIAGNOSIS — R2689 Other abnormalities of gait and mobility: Secondary | ICD-10-CM | POA: Diagnosis not present

## 2020-04-09 DIAGNOSIS — G4733 Obstructive sleep apnea (adult) (pediatric): Secondary | ICD-10-CM | POA: Diagnosis not present

## 2020-04-10 ENCOUNTER — Other Ambulatory Visit: Payer: Self-pay | Admitting: Family Medicine

## 2020-04-12 NOTE — Telephone Encounter (Signed)
Rx was last refilled on 07/14/19 for #90 with 2 refills.  Patient was last seen 01/13/20. OK to refill?

## 2020-04-13 DIAGNOSIS — R2689 Other abnormalities of gait and mobility: Secondary | ICD-10-CM | POA: Diagnosis not present

## 2020-04-13 DIAGNOSIS — R2681 Unsteadiness on feet: Secondary | ICD-10-CM | POA: Diagnosis not present

## 2020-04-14 ENCOUNTER — Other Ambulatory Visit: Payer: Self-pay | Admitting: Family Medicine

## 2020-04-14 DIAGNOSIS — E1169 Type 2 diabetes mellitus with other specified complication: Secondary | ICD-10-CM

## 2020-04-14 DIAGNOSIS — N183 Chronic kidney disease, stage 3 unspecified: Secondary | ICD-10-CM

## 2020-04-14 DIAGNOSIS — E785 Hyperlipidemia, unspecified: Secondary | ICD-10-CM

## 2020-04-14 DIAGNOSIS — E1140 Type 2 diabetes mellitus with diabetic neuropathy, unspecified: Secondary | ICD-10-CM

## 2020-04-14 DIAGNOSIS — E039 Hypothyroidism, unspecified: Secondary | ICD-10-CM

## 2020-04-14 DIAGNOSIS — IMO0002 Reserved for concepts with insufficient information to code with codable children: Secondary | ICD-10-CM

## 2020-04-15 ENCOUNTER — Other Ambulatory Visit: Payer: Self-pay

## 2020-04-15 ENCOUNTER — Other Ambulatory Visit (INDEPENDENT_AMBULATORY_CARE_PROVIDER_SITE_OTHER): Payer: Medicare HMO

## 2020-04-15 ENCOUNTER — Ambulatory Visit: Payer: Medicare HMO

## 2020-04-15 DIAGNOSIS — E039 Hypothyroidism, unspecified: Secondary | ICD-10-CM | POA: Diagnosis not present

## 2020-04-15 DIAGNOSIS — IMO0002 Reserved for concepts with insufficient information to code with codable children: Secondary | ICD-10-CM

## 2020-04-15 DIAGNOSIS — R2681 Unsteadiness on feet: Secondary | ICD-10-CM | POA: Diagnosis not present

## 2020-04-15 DIAGNOSIS — N183 Chronic kidney disease, stage 3 unspecified: Secondary | ICD-10-CM | POA: Diagnosis not present

## 2020-04-15 DIAGNOSIS — E1165 Type 2 diabetes mellitus with hyperglycemia: Secondary | ICD-10-CM

## 2020-04-15 DIAGNOSIS — E1122 Type 2 diabetes mellitus with diabetic chronic kidney disease: Secondary | ICD-10-CM

## 2020-04-15 DIAGNOSIS — E785 Hyperlipidemia, unspecified: Secondary | ICD-10-CM

## 2020-04-15 DIAGNOSIS — E1169 Type 2 diabetes mellitus with other specified complication: Secondary | ICD-10-CM | POA: Diagnosis not present

## 2020-04-15 DIAGNOSIS — E1140 Type 2 diabetes mellitus with diabetic neuropathy, unspecified: Secondary | ICD-10-CM

## 2020-04-15 DIAGNOSIS — R2689 Other abnormalities of gait and mobility: Secondary | ICD-10-CM | POA: Diagnosis not present

## 2020-04-15 LAB — COMPREHENSIVE METABOLIC PANEL
ALT: 15 U/L (ref 0–53)
AST: 17 U/L (ref 0–37)
Albumin: 3.7 g/dL (ref 3.5–5.2)
Alkaline Phosphatase: 51 U/L (ref 39–117)
BUN: 25 mg/dL — ABNORMAL HIGH (ref 6–23)
CO2: 29 mEq/L (ref 19–32)
Calcium: 8.9 mg/dL (ref 8.4–10.5)
Chloride: 102 mEq/L (ref 96–112)
Creatinine, Ser: 1.44 mg/dL (ref 0.40–1.50)
GFR: 46.43 mL/min — ABNORMAL LOW (ref >60.00)
Glucose, Bld: 220 mg/dL — ABNORMAL HIGH (ref 70–99)
Potassium: 4.3 mEq/L (ref 3.5–5.1)
Sodium: 137 mEq/L (ref 135–145)
Total Bilirubin: 0.5 mg/dL (ref 0.2–1.2)
Total Protein: 6.7 g/dL (ref 6.0–8.3)

## 2020-04-15 LAB — CBC WITH DIFFERENTIAL/PLATELET
Basophils Absolute: 0 10*3/uL (ref 0.0–0.1)
Basophils Relative: 0.4 % (ref 0.0–3.0)
Eosinophils Absolute: 0.1 10*3/uL (ref 0.0–0.7)
Eosinophils Relative: 1.8 % (ref 0.0–5.0)
HCT: 41.8 % (ref 39.0–52.0)
Hemoglobin: 14.1 g/dL (ref 13.0–17.0)
Lymphocytes Relative: 27.1 % (ref 12.0–46.0)
Lymphs Abs: 1.8 10*3/uL (ref 0.7–4.0)
MCHC: 33.7 g/dL (ref 30.0–36.0)
MCV: 89.3 fl (ref 78.0–100.0)
Monocytes Absolute: 0.7 10*3/uL (ref 0.1–1.0)
Monocytes Relative: 10.1 % (ref 3.0–12.0)
Neutro Abs: 4 10*3/uL (ref 1.4–7.7)
Neutrophils Relative %: 60.6 % (ref 43.0–77.0)
Platelets: 166 10*3/uL (ref 150.0–400.0)
RBC: 4.68 Mil/uL (ref 4.22–5.81)
RDW: 13.5 % (ref 11.5–15.5)
WBC: 6.7 10*3/uL (ref 4.0–10.5)

## 2020-04-15 LAB — LIPID PANEL
Cholesterol: 139 mg/dL (ref 0–200)
HDL: 18.1 mg/dL — ABNORMAL LOW (ref >39.00)
NonHDL: 121.24
Total CHOL/HDL Ratio: 8
Triglycerides: 244 mg/dL — ABNORMAL HIGH (ref 0.0–149.0)
VLDL: 48.8 mg/dL — ABNORMAL HIGH (ref 0.0–40.0)

## 2020-04-15 LAB — LDL CHOLESTEROL, DIRECT: Direct LDL: 87 mg/dL

## 2020-04-15 LAB — HEMOGLOBIN A1C: Hgb A1c MFr Bld: 8 % — ABNORMAL HIGH (ref 4.6–6.5)

## 2020-04-15 LAB — VITAMIN D 25 HYDROXY (VIT D DEFICIENCY, FRACTURES): VITD: 40.46 ng/mL (ref 30.00–100.00)

## 2020-04-15 LAB — TSH: TSH: 0.9 u[IU]/mL (ref 0.35–4.50)

## 2020-04-16 DIAGNOSIS — E1142 Type 2 diabetes mellitus with diabetic polyneuropathy: Secondary | ICD-10-CM | POA: Diagnosis not present

## 2020-04-16 DIAGNOSIS — S90121A Contusion of right lesser toe(s) without damage to nail, initial encounter: Secondary | ICD-10-CM | POA: Diagnosis not present

## 2020-04-16 DIAGNOSIS — I739 Peripheral vascular disease, unspecified: Secondary | ICD-10-CM | POA: Diagnosis not present

## 2020-04-16 DIAGNOSIS — S9031XA Contusion of right foot, initial encounter: Secondary | ICD-10-CM | POA: Diagnosis not present

## 2020-04-16 DIAGNOSIS — M79671 Pain in right foot: Secondary | ICD-10-CM | POA: Diagnosis not present

## 2020-04-16 DIAGNOSIS — S90821A Blister (nonthermal), right foot, initial encounter: Secondary | ICD-10-CM | POA: Diagnosis not present

## 2020-04-20 ENCOUNTER — Other Ambulatory Visit: Payer: Self-pay

## 2020-04-20 ENCOUNTER — Encounter (HOSPITAL_COMMUNITY): Payer: Self-pay | Admitting: Emergency Medicine

## 2020-04-20 ENCOUNTER — Emergency Department (HOSPITAL_COMMUNITY)
Admission: EM | Admit: 2020-04-20 | Discharge: 2020-04-20 | Disposition: A | Discharge status: Home / Self Care | Payer: Medicare HMO | Attending: Emergency Medicine | Admitting: Emergency Medicine

## 2020-04-20 DIAGNOSIS — Z5321 Procedure and treatment not carried out due to patient leaving prior to being seen by health care provider: Secondary | ICD-10-CM | POA: Diagnosis not present

## 2020-04-20 DIAGNOSIS — G2581 Restless legs syndrome: Secondary | ICD-10-CM | POA: Insufficient documentation

## 2020-04-20 LAB — CBC WITH DIFFERENTIAL/PLATELET
Abs Immature Granulocytes: 0.03 10*3/uL (ref 0.00–0.07)
Basophils Absolute: 0 10*3/uL (ref 0.0–0.1)
Basophils Relative: 0 %
Eosinophils Absolute: 0.2 10*3/uL (ref 0.0–0.5)
Eosinophils Relative: 2 %
HCT: 40.9 % (ref 39.0–52.0)
Hemoglobin: 13.7 g/dL (ref 13.0–17.0)
Immature Granulocytes: 0 %
Lymphocytes Relative: 18 %
Lymphs Abs: 1.8 10*3/uL (ref 0.7–4.0)
MCH: 30 pg (ref 26.0–34.0)
MCHC: 33.5 g/dL (ref 30.0–36.0)
MCV: 89.5 fL (ref 80.0–100.0)
Monocytes Absolute: 1 10*3/uL (ref 0.1–1.0)
Monocytes Relative: 10 %
Neutro Abs: 6.6 10*3/uL (ref 1.7–7.7)
Neutrophils Relative %: 70 %
Platelets: 182 10*3/uL (ref 150–400)
RBC: 4.57 MIL/uL (ref 4.22–5.81)
RDW: 13 % (ref 11.5–15.5)
WBC: 9.5 10*3/uL (ref 4.0–10.5)
nRBC: 0 % (ref 0.0–0.2)

## 2020-04-20 LAB — COMPREHENSIVE METABOLIC PANEL
ALT: 18 U/L (ref 0–44)
AST: 20 U/L (ref 15–41)
Albumin: 3.2 g/dL — ABNORMAL LOW (ref 3.5–5.0)
Alkaline Phosphatase: 48 U/L (ref 38–126)
Anion gap: 13 (ref 5–15)
BUN: 21 mg/dL (ref 8–23)
CO2: 22 mmol/L (ref 22–32)
Calcium: 8.8 mg/dL — ABNORMAL LOW (ref 8.9–10.3)
Chloride: 100 mmol/L (ref 98–111)
Creatinine, Ser: 1.67 mg/dL — ABNORMAL HIGH (ref 0.61–1.24)
GFR calc Af Amer: 42 mL/min — ABNORMAL LOW (ref >60)
GFR calc non Af Amer: 36 mL/min — ABNORMAL LOW (ref >60)
Glucose, Bld: 282 mg/dL — ABNORMAL HIGH (ref 70–99)
Potassium: 3.9 mmol/L (ref 3.5–5.1)
Sodium: 135 mmol/L (ref 135–145)
Total Bilirubin: 0.6 mg/dL (ref 0.3–1.2)
Total Protein: 6.5 g/dL (ref 6.5–8.1)

## 2020-04-20 NOTE — ED Triage Notes (Addendum)
Pt st's he has restless legs and has not been able to sleep in 2 days.  St's he is taking his Neurontin without relief.   Pt st's he took Valium 5mg  aprox 2 hours ago with some relief

## 2020-04-20 NOTE — ED Notes (Signed)
Pt state they were leaving AMA

## 2020-04-21 ENCOUNTER — Emergency Department (HOSPITAL_COMMUNITY): Payer: Medicare HMO

## 2020-04-21 ENCOUNTER — Encounter (HOSPITAL_COMMUNITY): Payer: Self-pay

## 2020-04-21 ENCOUNTER — Telehealth: Payer: Self-pay | Admitting: Family Medicine

## 2020-04-21 ENCOUNTER — Emergency Department (HOSPITAL_COMMUNITY)
Admission: EM | Admit: 2020-04-21 | Discharge: 2020-04-21 | Disposition: A | Discharge status: Home / Self Care | Payer: Medicare HMO | Attending: Emergency Medicine | Admitting: Emergency Medicine

## 2020-04-21 DIAGNOSIS — Z794 Long term (current) use of insulin: Secondary | ICD-10-CM | POA: Diagnosis not present

## 2020-04-21 DIAGNOSIS — I7090 Unspecified atherosclerosis: Secondary | ICD-10-CM | POA: Diagnosis not present

## 2020-04-21 DIAGNOSIS — I959 Hypotension, unspecified: Secondary | ICD-10-CM | POA: Diagnosis not present

## 2020-04-21 DIAGNOSIS — Z85828 Personal history of other malignant neoplasm of skin: Secondary | ICD-10-CM | POA: Insufficient documentation

## 2020-04-21 DIAGNOSIS — D5780 Other sickle-cell disorders without crisis: Secondary | ICD-10-CM | POA: Diagnosis not present

## 2020-04-21 DIAGNOSIS — N183 Chronic kidney disease, stage 3 unspecified: Secondary | ICD-10-CM | POA: Insufficient documentation

## 2020-04-21 DIAGNOSIS — I130 Hypertensive heart and chronic kidney disease with heart failure and stage 1 through stage 4 chronic kidney disease, or unspecified chronic kidney disease: Secondary | ICD-10-CM | POA: Diagnosis not present

## 2020-04-21 DIAGNOSIS — G2581 Restless legs syndrome: Secondary | ICD-10-CM | POA: Diagnosis not present

## 2020-04-21 DIAGNOSIS — T50995A Adverse effect of other drugs, medicaments and biological substances, initial encounter: Secondary | ICD-10-CM | POA: Diagnosis not present

## 2020-04-21 DIAGNOSIS — I5032 Chronic diastolic (congestive) heart failure: Secondary | ICD-10-CM | POA: Insufficient documentation

## 2020-04-21 DIAGNOSIS — T50905A Adverse effect of unspecified drugs, medicaments and biological substances, initial encounter: Secondary | ICD-10-CM | POA: Diagnosis not present

## 2020-04-21 DIAGNOSIS — Z79899 Other long term (current) drug therapy: Secondary | ICD-10-CM | POA: Insufficient documentation

## 2020-04-21 DIAGNOSIS — G4489 Other headache syndrome: Secondary | ICD-10-CM | POA: Diagnosis not present

## 2020-04-21 DIAGNOSIS — R0902 Hypoxemia: Secondary | ICD-10-CM | POA: Diagnosis not present

## 2020-04-21 DIAGNOSIS — E1165 Type 2 diabetes mellitus with hyperglycemia: Secondary | ICD-10-CM | POA: Diagnosis not present

## 2020-04-21 DIAGNOSIS — E039 Hypothyroidism, unspecified: Secondary | ICD-10-CM | POA: Diagnosis not present

## 2020-04-21 DIAGNOSIS — R42 Dizziness and giddiness: Secondary | ICD-10-CM | POA: Diagnosis not present

## 2020-04-21 DIAGNOSIS — I6381 Other cerebral infarction due to occlusion or stenosis of small artery: Secondary | ICD-10-CM | POA: Diagnosis not present

## 2020-04-21 DIAGNOSIS — E1140 Type 2 diabetes mellitus with diabetic neuropathy, unspecified: Secondary | ICD-10-CM | POA: Insufficient documentation

## 2020-04-21 DIAGNOSIS — G319 Degenerative disease of nervous system, unspecified: Secondary | ICD-10-CM | POA: Diagnosis not present

## 2020-04-21 LAB — CBC WITH DIFFERENTIAL/PLATELET
Abs Immature Granulocytes: 0.02 10*3/uL (ref 0.00–0.07)
Basophils Absolute: 0 10*3/uL (ref 0.0–0.1)
Basophils Relative: 0 %
Eosinophils Absolute: 0.1 10*3/uL (ref 0.0–0.5)
Eosinophils Relative: 2 %
HCT: 40.5 % (ref 39.0–52.0)
Hemoglobin: 13.4 g/dL (ref 13.0–17.0)
Immature Granulocytes: 0 %
Lymphocytes Relative: 20 %
Lymphs Abs: 1.4 10*3/uL (ref 0.7–4.0)
MCH: 30.2 pg (ref 26.0–34.0)
MCHC: 33.1 g/dL (ref 30.0–36.0)
MCV: 91.2 fL (ref 80.0–100.0)
Monocytes Absolute: 0.8 10*3/uL (ref 0.1–1.0)
Monocytes Relative: 12 %
Neutro Abs: 4.7 10*3/uL (ref 1.7–7.7)
Neutrophils Relative %: 66 %
Platelets: 184 10*3/uL (ref 150–400)
RBC: 4.44 MIL/uL (ref 4.22–5.81)
RDW: 13.1 % (ref 11.5–15.5)
WBC: 7.1 10*3/uL (ref 4.0–10.5)
nRBC: 0 % (ref 0.0–0.2)

## 2020-04-21 LAB — BASIC METABOLIC PANEL
Anion gap: 7 (ref 5–15)
BUN: 22 mg/dL (ref 8–23)
CO2: 24 mmol/L (ref 22–32)
Calcium: 8.4 mg/dL — ABNORMAL LOW (ref 8.9–10.3)
Chloride: 103 mmol/L (ref 98–111)
Creatinine, Ser: 1.37 mg/dL — ABNORMAL HIGH (ref 0.61–1.24)
GFR calc Af Amer: 54 mL/min — ABNORMAL LOW (ref >60)
GFR calc non Af Amer: 46 mL/min — ABNORMAL LOW (ref >60)
Glucose, Bld: 291 mg/dL — ABNORMAL HIGH (ref 70–99)
Potassium: 4.5 mmol/L (ref 3.5–5.1)
Sodium: 134 mmol/L — ABNORMAL LOW (ref 135–145)

## 2020-04-21 LAB — CBG MONITORING, ED: Glucose-Capillary: 246 mg/dL — ABNORMAL HIGH (ref 70–99)

## 2020-04-21 NOTE — Telephone Encounter (Signed)
Patient called back to speak with CMA. Please advise.

## 2020-04-21 NOTE — ED Notes (Signed)
Pt attempted to urinate unsuccessful. Refused In and out cath.

## 2020-04-21 NOTE — Discharge Instructions (Addendum)
Taper off the neurontin by decreasing the dose by one-half for 3 days. Follow-up with your PCP as discussed. You may benefit from a consult with neurology. Your PCP should be able to assist with scheduling a consult.

## 2020-04-21 NOTE — Telephone Encounter (Signed)
Lvm, at home #, letting pt know we are returning his call and asking him to call back.  Vm box full on cellphone.

## 2020-04-21 NOTE — ED Triage Notes (Signed)
Per EMS pt has hx of restless leg syndrome and has been experiencing dizziness, unsteady gait, and double vision for "about a month". Pt reports that he has always had unsteady gait since his last stroke but this has been worsening.

## 2020-04-21 NOTE — Telephone Encounter (Addendum)
Noted. Will await ER eval. We were unable to reach him this morning after his phone call.

## 2020-04-21 NOTE — Telephone Encounter (Addendum)
Returned pt's call.  Lvm asking pt to call back.  Need to find out reason for his call.

## 2020-04-21 NOTE — Telephone Encounter (Signed)
Unable to reach anyone on multiple contact #s. Sending access nurse note to Dr Darnell Level and Lattie Haw CMA. While trying to contact someone it came up on chart review tab that pt is at [facility] ED now.

## 2020-04-21 NOTE — Telephone Encounter (Signed)
Called, left message, got cut off. Called again, left another message.  Please call this morning to touch base with patient.

## 2020-04-21 NOTE — ED Provider Notes (Signed)
[facility] EMERGENCY DEPARTMENT Provider Note   CSN: [id number] Arrival date & time: 04/21/20  1333     History No chief complaint on file.   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  Patient presents via EMS with history of increasing dizziness, double vision, "brain fog" and unsteady gait. Symptoms onset about 4 weeks ago, and seems to be associated with an increase in his neurontin dosing for restless legs.  The history is provided by the patient.  Dizziness Quality:  Imbalance Severity:  Moderate Onset quality:  Gradual Timing:  Constant Progression:  Unchanged Chronicity:  New Associated symptoms: vision changes   Associated symptoms: no chest pain and no shortness of breath   Risk factors: new medications        Past Medical History:  Diagnosis Date  . Anemia   . Basal cell carcinoma 2018   R temple   . Basal cell carcinoma of nose 2002   bridge of nose  . BPH with obstruction/lower urinary tract symptoms    failed flomax, uroxatral, myrbetriq - sees urology [name]) pending videourodynamics  . CKD (chronic kidney disease) stage 3, GFR 30-59 ml/min 03/11/2012   Renal US 02/2014 - multiple kidney cysts   . CVA (cerebral infarction) 03/07/12   slurred speech; right hemplegia, left handed - completed PT 07/2014 (17 sessions)  . Depression    mild, on cymbalta per prior PCP  . Erectile dysfunction    per urology (prostaglandin injection)  . GERD (gastroesophageal reflux disease)    with sliding HH  . H/O hiatal hernia   . History of kidney problems 12/2010   lacerated left kidney after fall  . HLD (hyperlipidemia)   . Hx of basal cell carcinoma 07/10/2017   R. lat. forehead near hair line  . Kidney cysts 02/2014   bilateral (renal US)  . Migraines 03/11/12   now rare  . OSA on CPAP    sleep study 01/2014 - severe, CPAP at 10, previously on nuvigil (Dr. [name])  . Palpitations 01/2012   holter WNL, rare PAC/PVCs  . Presbycusis of  both ears 2016   rec amplification [facility])  . Restless leg syndrome    well controlled on mirapex  . Rosacea    [place]  . Stroke ([place])   . TIA (transient ischemic attack)   . Type 2 diabetes, controlled, with neuropathy [facility]) 2011   [name] in [facility]    Patient Active Problem List   Diagnosis Date Noted  . Post-nasal drainage 07/16/2019  . Recurrent productive cough 05/31/2019  . Aphasia as late effect of stroke 03/11/2019  . Hx of colonic polyps 03/11/2019  . Hypertensive renal disease 03/11/2019  . Hypothyroidism 03/11/2019  . Iron deficiency anemia due to chronic blood loss 03/11/2019  . Polyneuropathy due to type 2 diabetes mellitus ([place]) 03/11/2019  . Weight loss 03/11/2019  . Headache 10/30/2018  . Left wrist pain 01/15/2018  . Hx of basal cell carcinoma 07/10/2017  . Rosacea   . Gait disturbance, post-stroke 10/05/2015  . Presbycusis of both ears   . Advanced care planning/counseling discussion 06/23/2015  . Impaired mobility 04/23/2015  . HTN (hypertension) 03/18/2015  . Chronic diastolic heart failure, NYHA class 1 ([place]) 03/18/2015  . Familial multiple lipoprotein-type hyperlipidemia 01/19/2015  . Spastic hemiparesis affecting dominant side ([place]) 07/07/2014  . Shoulder joint pain 03/16/2014  . Chest pressure 02/03/2014  . Other chest pain 02/03/2014  . Medicare annual wellness visit, subsequent 11/06/2013  .  Erectile dysfunction due to arterial insufficiency 02/05/2013  . MDD (major depressive disorder), recurrent episode, moderate ([place])   . Palpitations 06/26/2012  . Hyperlipidemia associated with type 2 diabetes mellitus ([place])   . Restless leg syndrome   . GERD (gastroesophageal reflux disease)   . BPH with obstruction/lower urinary tract symptoms   . History of CVA (cerebrovascular accident) 03/11/2012  . Hemiparesis affecting right side as late effect of stroke ([place]) 03/11/2012  . Type 2 diabetes mellitus, uncontrolled, with neuropathy ([place]) 03/11/2012   . OSA on CPAP 03/11/2012  . CKD stage 3 secondary to diabetes ([place]) 03/11/2012  . Migraines 03/11/2012  . Cerebral artery occlusion with cerebral infarction ([place]) 03/11/2012    Past Surgical History:  Procedure Laterality Date  . ABI  2007   WNL  . CARDIAC CATHETERIZATION  2004   normal; [place]  . CARDIOVASCULAR STRESS TEST  02/2014   WNL, low risk scan, EF 68%  . CAROTID ENDARTERECTOMY    . CATARACT EXTRACTION W/ INTRAOCULAR LENS IMPLANT  11//2012 - 08/2011  . COLONOSCOPY  03/2011   poor prep, unable to biopsy polyp in tic fold, rpt 3 mo [name])  . COLONOSCOPY  06/2011   mult polyps adenomatous, severe diverticulosis, rpt 3 yrs [name])  . CYSTOSCOPY WITH URETHRAL DILATATION N/A 07/24/2013   Procedure: CYSTOSCOPY WITH URETHRAL DILATATION;  Surgeon: [name], MD;  Location: [facility] ORS;  Service: Urology;  Laterality: N/A;  . ESOPHAGOGASTRODUODENOSCOPY  2006   duodenitis, medual HH, Grade 2 reflux, mild gastritis  . ESOPHAGOGASTRODUODENOSCOPY  07/2011   mild chronic gastritis  . [place]  . [place]; 2002   left, then repaired  . MRI brain  04/2010   WNL  . PROSTATE SURGERY  02/2009   PVP (laser)  . SKIN CANCER EXCISION  2002   bridge of nose, BCC  . TONSILLECTOMY AND ADENOIDECTOMY  ~ 1945  . TRANSURETHRAL INCISION OF PROSTATE N/A 07/24/2013   Procedure: TRANSURETHRAL INCISION OF THE PROSTATE (TUIP);  Surgeon: [name], MD;  Location: [facility] ORS;  Service: Urology;  Laterality: N/A;  . URETHRAL DILATION  2014   [place]       Family History  Problem Relation Age of Onset  . Stroke Father   . Lung cancer Father 65        smoker  . Stroke Brother   . Diabetes Brother 59  . Lung cancer Brother   . Alcoholism Sister   . Kidney disease Sister   . Coronary artery disease Neg Hx     Social History   Tobacco Use  . Smoking status: Never Smoker  . Smokeless tobacco: Never Used  . Tobacco comment: "used to smoke  pipe when I was younger"  Vaping Use  . Vaping Use: Never used  Substance Use Topics  . Alcohol use: Yes    Alcohol/week: 4.0 standard drinks    Types: 2 Glasses of wine, 2 Shots of liquor per week    Comment: occasional  . Drug use: No    Home Medications Prior to Admission medications   Medication Sig Start Date End Date Taking? Authorizing Provider  pramipexole (MIRAPEX) 1 MG tablet TAKE 1 TABLET TWICE A DAY FOR RESTLESS LEG SYNDROME (NEW DIRECTION) 03/30/20   [name], MD  Alpha-Lipoic Acid 600 MG CAPS Take 1 capsule (600 mg total) by mouth daily. 12/07/19   [name], MD  atorvastatin (LIPITOR) 20 MG tablet  03/06/19   [provider]  azelastine (ASTELIN) 0.1 % nasal spray Place 1 spray into both nostrils 2 (two) times daily as needed for rhinitis. Use in each nostril as directed 02/24/19   [name], MD  BD PEN NEEDLE NANO 2ND GEN 32G X 4 MM MISC  12/29/19   [provider]  capsaicin (ZOSTRIX) 0.025 % cream Apply topically 2 (two) times daily. 01/15/18   [name], MD  clopidogrel (PLAVIX) 75 MG tablet Take 1 tablet (75 mg total) by mouth daily. 01/13/20   [name], MD  Coenzyme Q10 (COQ10) 150 MG CAPS Take 1 capsule by mouth 2 (two) times daily.    [provider]  CRESTOR 40 MG tablet TAKE 1 TABLET DAILY 09/12/17   [name], MD  diazepam (VALIUM) 5 MG tablet Take 1 tablet (5 mg total) by mouth every 8 (eight) hours as needed for muscle spasms. 03/27/17   [name], MD  diclofenac sodium (VOLTAREN) 1 % GEL APPLY 2 GRAMS TOPICALLY FOUR TIMES A DAY 01/28/18   Kirsteins, Luanna Salk, MD  DULoxetine (CYMBALTA) 60 MG capsule TAKE 1 CAPSULE DAILY 04/05/20   [name], MD  empagliflozin (JARDIANCE) 10 MG TABS tablet Take 1 tablet (10 mg total) by mouth daily before breakfast. 03/10/20   [name], MD  famotidine (PEPCID) 20 MG tablet Take 1 tablet (20 mg total) by mouth at bedtime. 07/16/19    [name], MD  ferrous sulfate 325 (65 FE) MG tablet Take 1 tablet (325 mg total) by mouth every other day. 10/01/19   [name], MD  FOLBEE 2.5-25-1 MG TABS tablet TAKE 1 TABLET DAILY 11/23/16   [name], MD  gabapentin (NEURONTIN) 300 MG capsule Take 2 capsules (600 mg total) by mouth 4 (four) times daily. 01/15/20   [name], MD  guaiFENesin-codeine [facility]) 100-10 MG/5ML syrup Take 5 mLs by mouth 2 (two) times daily as needed for cough or congestion. 07/11/19   [name], MD  insulin degludec (TRESIBA FLEXTOUCH) 200 UNIT/ML FlexTouch Pen Inject 90 Units into the skin at bedtime. 01/13/20   [name], MD  Insulin Pen Needle (NOVOFINE) 30G X 8 MM MISC Use to inject insulin once daily dx:E11.40 02/26/19   [name], MD  insulin regular (HUMULIN R) 100 units/mL injection Inject 0.06 mLs (6 Units total) into the skin 3 (three) times daily before meals. 01/13/20   [name], MD  levothyroxine (SYNTHROID) 50 MCG tablet Take 1 tablet (50 mcg total) by mouth daily before breakfast. Per endo [name] 12/07/19   [name], MD  metoprolol tartrate (LOPRESSOR) 25 MG tablet TAKE ONE-HALF (1/2) TABLET (12.5 MG) AT BEDTIME WITH MORNING DOSE IF NEEDED 04/13/20   [name], MD  metroNIDAZOLE (METROCREAM) 0.75 % cream Apply 1 application topically 3 (three) times daily.    [provider]  nitroGLYCERIN (NITROSTAT) 0.4 MG SL tablet DISSOLVE 1 TABLET UNDER THE TONGUE EVERY 5 MINUTES AS NEEDED FOR CHEST PAIN 07/28/16   [name], MD  omega-3 acid ethyl esters (LOVAZA) 1 g capsule Take 2 capsules (2 g total) by mouth 2 (two) times daily. 06/28/16   [name], MD  RABEprazole (ACIPHEX) 20 MG tablet Take 1 tablet (20 mg total) by mouth daily. 12/08/19   [name], MD  Semaglutide [facility]) 0.25 or 0.5 MG/DOSE SOPN Inject into the skin.    [provider]  traZODone (DESYREL) 50 MG tablet Take 50 mg by  mouth at bedtime as needed for sleep.    [provider]  valsartan (DIOVAN) 40 MG tablet TAKE 1 TABLET DAILY 01/05/20   [name], MD  vitamin C (ASCORBIC ACID) 500 MG tablet Take 1,000 mg by mouth daily.     [provider]    Allergies    Gabapentin, Lisinopril, Nexium [esomeprazole], and Niacin and related  Review of Systems   Review of Systems  Constitutional: Positive for activity change.  Respiratory: Negative for shortness of breath.   Cardiovascular: Negative for chest pain.  Neurological: Positive for dizziness.  All other systems reviewed and are negative.   Physical Exam Updated Vital Signs BP 133/69 (BP Location: Right Arm)   Pulse 64   Resp 14   SpO2 100%   Physical Exam Vitals and nursing note reviewed.  HENT:     Head: Atraumatic.     Mouth/Throat:     Mouth: Mucous membranes are moist.  Eyes:     Extraocular Movements: Extraocular movements intact.     Right eye: No nystagmus.     Left eye: No nystagmus.     Conjunctiva/sclera: Conjunctivae normal.  Cardiovascular:     Rate and Rhythm: Normal rate and regular rhythm.  Pulmonary:     Effort: Pulmonary effort is normal.     Breath sounds: Normal breath sounds.  Abdominal:     Palpations: Abdomen is soft.  Musculoskeletal:        General: No swelling.  Skin:    General: Skin is warm and dry.  Neurological:     Mental Status: He is alert.     GCS: GCS eye subscore is 4. GCS verbal subscore is 5. GCS motor subscore is 6.     Cranial Nerves: No dysarthria or facial asymmetry.     Sensory: Sensation is intact.     Comments: Patient with residual weakness of right upper and lower extremities secondary to prior stroke.  Psychiatric:        Mood and Affect: Mood normal.        Behavior: Behavior normal.     ED Results / Procedures / Treatments   Labs (all labs ordered are listed, but only abnormal results are displayed) Labs Reviewed  BASIC METABOLIC PANEL - Abnormal;  Notable for the following components:      Result Value   Sodium 134 (*)    Glucose, Bld 291 (*)    Creatinine, Ser 1.37 (*)    Calcium 8.4 (*)    GFR calc non Af Amer 46 (*)    GFR calc Af Amer 54 (*)    All other components within normal limits  CBG MONITORING, ED - Abnormal; Notable for the following components:   Glucose-Capillary 246 (*)    All other components within normal limits  CBC WITH DIFFERENTIAL/PLATELET  URINALYSIS, ROUTINE W REFLEX MICROSCOPIC    EKG None  Radiology CT Head Wo Contrast  Result Date: 04/21/2020 CLINICAL DATA:  Dizziness, nonspecific. Additional history provided: Patient reports dizziness and unsteady gait for 1 week EXAM: CT HEAD WITHOUT CONTRAST TECHNIQUE: Contiguous axial images were obtained from the base of the skull through the vertex without intravenous contrast. COMPARISON:  Brain MRI 10/26/2018, head CT 03/18/2015 FINDINGS: Brain: Stable, mild generalized parenchymal atrophy. Redemonstrated chronic lacunar infarct within the left corona radiata/lentiform nucleus with associated wallerian degeneration. There is no acute intracranial hemorrhage. No demarcated cortical infarct is identified. No extra-axial fluid collection. No evidence of intracranial mass. No midline shift. Vascular: No hyperdense vessel.  Atherosclerotic calcifications Skull: Normal. Negative for fracture or  focal suspicious osseous lesion. Sinuses/Orbits: Visualized orbits show no acute finding. No significant paranasal sinus disease or mastoid effusion at the imaged levels. IMPRESSION: No CT evidence of acute intracranial abnormality. Redemonstrated chronic lacunar infarct within the left corona radiata/basal ganglia with associated wallerian degeneration. Stable, mild generalized parenchymal atrophy. Electronically Signed   By: Kellie Simmering DO   On: 04/21/2020 14:53    Procedures Procedures (including critical care time)  Medications Ordered in ED Medications - No data to display   ED Course  I have reviewed the triage vital signs and the nursing notes.  Pertinent labs & imaging results that were available during my care of the patient were reviewed by me and considered in my medical decision making (see chart for details).    MDM Rules/Calculators/A&P                          Patient discussed with and seen by Dr. Ron Parker. Renal functions are at baseline. CT of head without any acute abnormality.  Spoke with patient's PCP Danise Mina) regarding today's visit.  Suspect symptoms are due to neurontin toxicity. Will implement a reduction taper.  Wound on right heel is responding well to wound care plan. No purulent drainage noted.  Patient is scheduled to see his PCP tomorrow. May benefit from neurology consult as RLS symptoms have been difficult to control (is currently on mirapex and neurontin). Final Clinical Impression(s) / ED Diagnoses Final diagnoses:  Adverse effect of drug, initial encounter  Restless legs syndrome (RLS)    Rx / DC Orders ED Discharge Orders    None       Etta Quill, NP 04/21/20 2308    Breck Coons, MD 04/22/20 [phone]

## 2020-04-21 NOTE — Telephone Encounter (Signed)
[facility] - Client TELEPHONE ADVICE RECORD [facility] Patient Name: James Bell Gender: Male DOB: March 01, 1931 Age: 86 Y 9 M 21 D Return Phone Number: [phone] (Primary) Address: City/State/Zip: [place] [address] Client [facility] - Client Client Site [facility] Physician [name] - MD Contact Type Call Who Is Calling Patient / Member / Family / Caregiver Call Type Triage / Clinical Caller Name paul greene Relationship To Patient Son Return Phone Number [phone] (Primary) Chief Complaint CONFUSION - new onset Reason for Call Symptomatic / Request for Health Information Initial Comment Er last night, has RLS, wants to find out if its critical. spasming was Very severe, and uneasiness where cognitive skills and mobility are decreased, states his mental is like a fog over him Translation No Nurse Assessment Nurse: Thad Ranger, RN, Langley Gauss Date/Time ([place] Time): 04/21/2020 11:43:43 AM Confirm and document reason for call. If symptomatic, describe symptoms. ---Pt in ER last night, has RLS and spasms were very severe last night, cognitive skills and mobility decreased. He has the same s/s today but also has double vision and dizziness today as well. Has the patient had close contact with a person known or suspected to have the novel coronavirus illness OR traveled / lives in area with major community spread (including international travel) in the last 14 days from the onset of symptoms? * If Asymptomatic, screen for exposure and travel within the last 14 days. ---No Does the patient have any new or worsening symptoms? ---Yes Will a triage be completed? ---Yes Related visit to physician within the last 2 weeks? ---Yes Does the PT have any chronic conditions? (i.e. diabetes, asthma, this includes High risk factors for pregnancy, etc.) ---Yes List chronic conditions.  ---Diabetes Is this a behavioral health or substance abuse call? ---No Guidelines Guideline Title Affirmed Question Affirmed Notes Nurse Date/Time (Eastern Time) Confusion - Delirium [1] Difficult to awaken or acting confused (e.g., disoriented, slurred speech) AND [2] present Carmon, RN, Langley Gauss 04/21/2020 11:46:56 AM PLEASE NOTE: All timestamps contained within this report are represented as [place] Standard Time. CONFIDENTIALTY NOTICE: This fax transmission is intended only for the addressee. It contains information that is legally privileged, confidential or otherwise protected from use or disclosure. If you are not the intended recipient, you are strictly prohibited from reviewing, disclosing, copying using or disseminating any of this information or taking any action in reliance on or regarding this information. If you have received this fax in error, please notify us immediately by telephone so that we can arrange for its return to [name]. Phone: [phone], Toll-Free: [phone], Fax: [phone] Page: 2 of 2 Call Id: [id number] Guidelines Guideline Title Affirmed Question Affirmed Notes Nurse Date/Time Eilene Ghazi Time) now AND [3] diabetes mellitus Disp. Time Eilene Ghazi Time) Disposition Final User 04/21/2020 11:37:32 AM Send to Urgent Queue Ilona Sorrel 04/21/2020 11:53:03 AM Send To RN Personal Thad Ranger, RN, Langley Gauss 04/21/2020 12:03:20 PM 911 Outcome Documentation Carmon, RN, Langley Gauss Reason: 911 cb complete and EMS in route. 04/21/2020 11:52:33 AM Call EMS 911 Now Yes Carmon, RN, Yevette Edwards Disagree/Comply Comply Caller Understands Yes PreDisposition Call Doctor Care Advice Given Per Guideline CALL EMS 911 NOW: CARE ADVICE given per Confusion-Delirium (Adult) guideline. Comments User: Romeo Apple, RN Date/Time Eilene Ghazi Time): 04/21/2020 11:50:38 AM Advised to give pt a few crackers and small cup of milk just in case his BG is low. BG has not been checked and caller does not know how  to ck  it.

## 2020-04-21 NOTE — Telephone Encounter (Signed)
Patient called needing Dr.Gutierrez to call him ASAP regarding a ER visit patient had last night. Please call patient ASAP before his appointment tomorrow.

## 2020-04-22 ENCOUNTER — Ambulatory Visit (INDEPENDENT_AMBULATORY_CARE_PROVIDER_SITE_OTHER): Payer: Medicare HMO | Admitting: Family Medicine

## 2020-04-22 ENCOUNTER — Encounter: Payer: Self-pay | Admitting: Family Medicine

## 2020-04-22 ENCOUNTER — Other Ambulatory Visit: Payer: Self-pay

## 2020-04-22 VITALS — BP 120/66 | HR 68 | Temp 97.7°F | Ht 67.75 in | Wt 198.0 lb

## 2020-04-22 DIAGNOSIS — G2581 Restless legs syndrome: Secondary | ICD-10-CM

## 2020-04-22 DIAGNOSIS — I69351 Hemiplegia and hemiparesis following cerebral infarction affecting right dominant side: Secondary | ICD-10-CM | POA: Diagnosis not present

## 2020-04-22 DIAGNOSIS — R259 Unspecified abnormal involuntary movements: Secondary | ICD-10-CM

## 2020-04-22 DIAGNOSIS — E1169 Type 2 diabetes mellitus with other specified complication: Secondary | ICD-10-CM

## 2020-04-22 DIAGNOSIS — E785 Hyperlipidemia, unspecified: Secondary | ICD-10-CM

## 2020-04-22 MED ORDER — ROSUVASTATIN CALCIUM 40 MG PO TABS: 40.0000 mg | ORAL_TABLET | Freq: Every day | ORAL | 3 refills | Status: DC

## 2020-04-22 NOTE — Patient Instructions (Addendum)
We will refer you to [facility]rology for restless legs.  Continue gabapentin no more than 600mg  four times a day.  Try 1/2 tablet mirapex at night time for 1-2 weeks, watch effect on these movements.  Update me with effect.  Continue crestor (rosuvastatin) 40mg  daily for cholesterol levels - don't take atorvastatin (lipitor) Reschedule physical/wellness visit in 1 month.

## 2020-04-22 NOTE — Progress Notes (Signed)
This visit was conducted in person.  BP 120/66 (BP Location: Right Arm, Patient Position: Sitting, Cuff Size: Normal)   Pulse 68   Temp 97.7 F (36.5 C) (Temporal)   Ht 5' 7.75" (1.721 m)   Wt 198 lb (89.8 kg)   SpO2 99%   BMI 30.33 kg/m    CC: AMW/CPE - converted to ER f/u visit  Subjective:    Patient ID: James Spurr, MD, male    DOB: 1931/01/26, 86 y.o.   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  Seen yesterday at ER for apparently several weeks of increasing dizziness, double vision, brain fog and unsteady gait. This in setting of self increasing neurontin dose to 3 tab (900mg ) QID. Records reviewed. Received reassuring metabolic evaluation including head CT without contrast (remote lacunar infarct).   Has had difficult to control restless leg syndrome despite mirapex 2mg  nightly (which was decrease from previous higher dose) and gabapentin 600mg  QID - describes ongoing restless leg sensation including uncontrolled leg movements and shaking. Sometimes has arm movements. Has had episodes of "flopping" in bed, once resulting in fall to floor. Noticing some of these symptoms during the day but predominantly at night time. Klonopin trial previously didn't help.   He was previously on mirapex 2mg  nightly - endorses self tapering down to 1mg  several months ago.      Relevant past medical, surgical, family and social history reviewed and updated as indicated. Interim medical history since our last visit reviewed. Allergies and medications reviewed and updated. Outpatient Medications Prior to Visit  Medication Sig Dispense Refill  . Alpha-Lipoic Acid 600 MG CAPS Take 1 capsule (600 mg total) by mouth daily. 30 capsule 11  . azelastine (ASTELIN) 0.1 % nasal spray Place 1 spray into both nostrils 2 (two) times daily as needed for rhinitis.  Use in each nostril as directed 30 mL 6  . BD PEN NEEDLE NANO 2ND GEN 32G X 4 MM MISC     . capsaicin (ZOSTRIX) 0.025 % cream Apply topically 2 (two) times daily. 60 g 0  . clopidogrel (PLAVIX) 75 MG tablet Take 1 tablet (75 mg total) by mouth daily. 90 tablet 3  . Coenzyme Q10 (COQ10) 150 MG CAPS Take 1 capsule by mouth 2 (two) times daily.    . diazepam (VALIUM) 5 MG tablet Take 1 tablet (5 mg total) by mouth every 8 (eight) hours as needed for muscle spasms. 90 tablet 0  . diclofenac sodium (VOLTAREN) 1 % GEL APPLY 2 GRAMS TOPICALLY FOUR TIMES A DAY 300 g 1  . DULoxetine (CYMBALTA) 60 MG capsule TAKE 1 CAPSULE DAILY 90 capsule 0  . empagliflozin (JARDIANCE) 10 MG TABS tablet Take 1 tablet (10 mg total) by mouth daily before breakfast.    . famotidine (PEPCID) 20 MG tablet Take 1 tablet (20 mg total) by mouth at bedtime.    . ferrous sulfate 325 (65 FE) MG tablet Take 1 tablet (325 mg total) by mouth every other day.    Marland Kitchen FOLBEE 2.5-25-1 MG TABS tablet TAKE 1 TABLET DAILY 90 tablet 1  . gabapentin (NEURONTIN) 300 MG capsule Take 2 capsules (600 mg total) by mouth 4 (four) times daily. (Patient taking differently: Take 600 mg by mouth 4 (four) times daily. Takes 3 capsules 4 times a day) 720 capsule 1  . guaiFENesin-codeine (CHERATUSSIN  AC) 100-10 MG/5ML syrup Take 5 mLs by mouth 2 (two) times daily as needed for cough or congestion. 120 mL 0  . insulin degludec (TRESIBA FLEXTOUCH) 200 UNIT/ML FlexTouch Pen Inject 90 Units into the skin at bedtime.    . Insulin Pen Needle (NOVOFINE) 30G X 8 MM MISC Use to inject insulin once daily dx:E11.40 300 each 3  . insulin regular (HUMULIN R) 100 units/mL injection Inject 0.06 mLs (6 Units total) into the skin 3 (three) times daily before meals.    Marland Kitchen levothyroxine (SYNTHROID) 50 MCG tablet Take 1 tablet (50 mcg total) by mouth daily before breakfast. Per endo [name]    . LUMIGAN 0.01 % SOLN     . metoprolol tartrate (LOPRESSOR) 25 MG tablet TAKE ONE-HALF  (1/2) TABLET (12.5 MG) AT BEDTIME WITH MORNING DOSE IF NEEDED 90 tablet 3  . metroNIDAZOLE (METROCREAM) 0.75 % cream Apply 1 application topically 3 (three) times daily.    . nitroGLYCERIN (NITROSTAT) 0.4 MG SL tablet DISSOLVE 1 TABLET UNDER THE TONGUE EVERY 5 MINUTES AS NEEDED FOR CHEST PAIN 25 tablet 1  . omega-3 acid ethyl esters (LOVAZA) 1 g capsule Take 2 capsules (2 g total) by mouth 2 (two) times daily. 360 capsule 1  . pramipexole (MIRAPEX) 1 MG tablet Take 1 tablet (1 mg total) by mouth at bedtime.    . RABEprazole (ACIPHEX) 20 MG tablet Take 1 tablet (20 mg total) by mouth daily. 90 tablet 1  . Semaglutide (OZEMPIC) 0.25 or 0.5 MG/DOSE SOPN Inject into the skin.    Marland Kitchen traZODone (DESYREL) 50 MG tablet Take 50 mg by mouth at bedtime as needed for sleep.    . valsartan (DIOVAN) 40 MG tablet TAKE 1 TABLET DAILY 90 tablet 3  . vitamin C (ASCORBIC ACID) 500 MG tablet Take 1,000 mg by mouth daily.     Marland Kitchen atorvastatin (LIPITOR) 20 MG tablet     . CRESTOR 40 MG tablet TAKE 1 TABLET DAILY 90 tablet 1  . pramipexole (MIRAPEX) 1 MG tablet TAKE 1 TABLET TWICE A DAY FOR RESTLESS LEG SYNDROME (NEW DIRECTION) 180 tablet 1   No facility-administered medications prior to visit.     Per HPI unless specifically indicated in ROS section below Review of Systems Objective:  BP 120/66 (BP Location: Right Arm, Patient Position: Sitting, Cuff Size: Normal)   Pulse 68   Temp 97.7 F (36.5 C) (Temporal)   Ht 5' 7.75" (1.721 m)   Wt 198 lb (89.8 kg)   SpO2 99%   BMI 30.33 kg/m   Wt Readings from Last 3 Encounters:  04/22/20 198 lb (89.8 kg)  04/20/20 185 lb (83.9 kg)  01/13/20 192 lb 5 oz (87.2 kg)      Physical Exam Vitals and nursing note reviewed.  Constitutional:      Appearance: Normal appearance. He is not ill-appearing.     Comments: Sitting in wheelchair  Cardiovascular:     Rate and Rhythm: Normal rate and regular rhythm.     Pulses: Normal pulses.     Heart sounds: Normal heart  sounds. No murmur heard.   Pulmonary:     Effort: Pulmonary effort is normal. No respiratory distress.     Breath sounds: Normal breath sounds. No wheezing, rhonchi or rales.  Musculoskeletal:     Right lower leg: Edema (1+) present.     Left lower leg: Edema (tr) present.  Skin:    General: Skin is warm and dry.     Findings:  No rash.  Neurological:     Mental Status: He is alert.     Comments:  CN grossly intact Chronic spastic R hemiparesis Hand wringing movements noted on exam - pt has difficulty determining if this is involuntary or a nervous tick        Results for orders placed or performed during the hospital encounter of 04/21/20  CBC with Differential/Platelet  Result Value Ref Range   WBC 7.1 4.0 - 10.5 K/uL   RBC 4.44 4.22 - 5.81 MIL/uL   Hemoglobin 13.4 13.0 - 17.0 g/dL   HCT 40.5 39 - 52 %   MCV 91.2 80.0 - 100.0 fL   MCH 30.2 26.0 - 34.0 pg   MCHC 33.1 30.0 - 36.0 g/dL   RDW 13.1 11.5 - 15.5 %   Platelets 184 150 - 400 K/uL   nRBC 0.0 0.0 - 0.2 %   Neutrophils Relative % 66 %   Neutro Abs 4.7 1.7 - 7.7 K/uL   Lymphocytes Relative 20 %   Lymphs Abs 1.4 0.7 - 4.0 K/uL   Monocytes Relative 12 %   Monocytes Absolute 0.8 0 - 1 K/uL   Eosinophils Relative 2 %   Eosinophils Absolute 0.1 0 - 0 K/uL   Basophils Relative 0 %   Basophils Absolute 0.0 0 - 0 K/uL   Immature Granulocytes 0 %   Abs Immature Granulocytes 0.02 0.00 - 0.07 K/uL  Basic metabolic panel  Result Value Ref Range   Sodium 134 (L) 135 - 145 mmol/L   Potassium 4.5 3.5 - 5.1 mmol/L   Chloride 103 98 - 111 mmol/L   CO2 24 22 - 32 mmol/L   Glucose, Bld 291 (H) 70 - 99 mg/dL   BUN 22 8 - 23 mg/dL   Creatinine, Ser 1.37 (H) 0.61 - 1.24 mg/dL   Calcium 8.4 (L) 8.9 - 10.3 mg/dL   GFR calc non Af Amer 46 (L) >60 mL/min   GFR calc Af Amer 54 (L) >60 mL/min   Anion gap 7 5 - 15  CBG monitoring, ED  Result Value Ref Range   Glucose-Capillary 246 (H) 70 - 99 mg/dL   Lab Results  Component  Value Date   IRON 115 09/30/2019   FERRITIN 28.3 09/30/2019   Lab Results  Component Value Date   TSH 0.90 04/15/2020    Assessment & Plan:  This visit occurred during the SARS-CoV-2 public health emergency.  Safety protocols were in place, including screening questions prior to the visit, additional usage of staff PPE, and extensive cleaning of exam room while observing appropriate contact time as indicated for disinfecting solutions.   Problem List Items Addressed This Visit    Restless leg syndrome - Primary    Previous improvement when alpha lipoic acid was started, but over last several months notes worsening symptoms after dropping mirapex dose from 2mg  to 1mg  nightly, leading to self titration of gabapentin up to 900mg  QID. - with resultant adverse effects of dizziness, double vision, brain fog, unsteadiness which led to recent ER evaluation.  Has dropped dose back to prescribed 600mg  QID but notes ongoing difficulty with endorsed RLS symptoms as well as new possible involuntary movements of arms and legs - ?dyskinesias from dopamine agonist.  I recommended he lower mirapex dose to 0.5mg  nightly (1/2 tablet of 1mg ) over next few weeks and monitor effect on involuntary movements. I will also refer to neurology for further evaluation of difficult to control RLS - previous trial klonopin  was ineffective.       Relevant Orders   Ambulatory referral to Neurology   Involuntary movements    Concern for dyskinesia from mirapex use - will taper dose to 0.5mg  nightly and refer to neurology.       Hyperlipidemia associated with type 2 diabetes mellitus ([place])    Question of which statin he is taking at home - as he has both atorva and rosuvastatin on his medication list. Recommend he continue rosuvastatin - will remove atorva from drug list.  The ASCVD Risk score Mikey Bussing DC Jr., et al., 2013) failed to calculate for the following reasons:   The 2013 ASCVD risk score is only valid for ages 22 to  22   The patient has a prior MI or stroke diagnosis       Relevant Medications   rosuvastatin (CRESTOR) 40 MG tablet   Hemiparesis affecting right side as late effect of stroke ([place])       Meds ordered this encounter  Medications  . rosuvastatin (CRESTOR) 40 MG tablet    Sig: Take 1 tablet (40 mg total) by mouth daily.    Dispense:  90 tablet    Refill:  3   Orders Placed This Encounter  Procedures  . Ambulatory referral to Neurology    Referral Priority:   Routine    Referral Type:   Consultation    Referral Reason:   Specialty Services Required    Requested Specialty:   Neurology    Number of Visits Requested:   1    Patient Instructions  We will refer you to [facility] for restless legs.  Continue gabapentin no more than 600mg  four times a day.  Try 1/2 tablet mirapex at night time for 1-2 weeks, watch effect on these movements.  Update me with effect.  Continue crestor (rosuvastatin) 40mg  daily for cholesterol levels - don't take atorvastatin (lipitor) Reschedule physical/wellness visit in 1 month.    Follow up plan: Return in about 4 weeks (around 05/20/2020) for annual exam, prior fasting for blood work, follow up visit.  [name], MD

## 2020-04-25 DIAGNOSIS — R259 Unspecified abnormal involuntary movements: Secondary | ICD-10-CM | POA: Insufficient documentation

## 2020-04-25 NOTE — Assessment & Plan Note (Signed)
Concern for dyskinesia from mirapex use - will taper dose to 0.5mg  nightly and refer to neurology.

## 2020-04-25 NOTE — Assessment & Plan Note (Addendum)
Previous improvement when alpha lipoic acid was started, but over last several months notes worsening symptoms after dropping mirapex dose from 2mg  to 1mg  nightly, leading to self titration of gabapentin up to 900mg  QID. - with resultant adverse effects of dizziness, double vision, brain fog, unsteadiness which led to recent ER evaluation.  Has dropped dose back to prescribed 600mg  QID but notes ongoing difficulty with endorsed RLS symptoms as well as new possible involuntary movements of arms and legs - ?dyskinesias from dopamine agonist.  I recommended he lower mirapex dose to 0.5mg  nightly (1/2 tablet of 1mg ) over next few weeks and monitor effect on involuntary movements. I will also refer to neurology for further evaluation of difficult to control RLS - previous trial klonopin was ineffective.

## 2020-04-25 NOTE — Assessment & Plan Note (Signed)
Question of which statin he is taking at home - as he has both atorva and rosuvastatin on his medication list. Recommend he continue rosuvastatin - will remove atorva from drug list.  The ASCVD Risk score Mikey Bussing DC Jr., et al., 2013) failed to calculate for the following reasons:   The 2013 ASCVD risk score is only valid for ages 69 to 35   The patient has a prior MI or stroke diagnosis

## 2020-04-26 DIAGNOSIS — G2581 Restless legs syndrome: Secondary | ICD-10-CM | POA: Diagnosis not present

## 2020-04-26 DIAGNOSIS — E611 Iron deficiency: Secondary | ICD-10-CM | POA: Diagnosis not present

## 2020-04-30 ENCOUNTER — Telehealth: Payer: Self-pay | Admitting: Family Medicine

## 2020-04-30 MED ORDER — RABEPRAZOLE SODIUM 20 MG PO TBEC: 20.0000 mg | DELAYED_RELEASE_TABLET | Freq: Every day | ORAL | 0 refills | Status: DC

## 2020-04-30 NOTE — Telephone Encounter (Signed)
Pt called and would like a call back from lisa He didn't want to go into the reason why with me

## 2020-04-30 NOTE — Telephone Encounter (Signed)
Noted! Thank you

## 2020-04-30 NOTE — Telephone Encounter (Signed)
Returned pt's call.  States he accidentally dropped his bottle of rabeprazole in the trash and realized it went out with the trash.  Requests a 30-day refill sent to local [facility] and a 90-day refill sent to [facility].   E-scribed refills to requested locations.   Also, pt wanted to make Dr. Darnell Level aware that he could not be seen by referred neuro until late Sept.  So pt made his own appt with [facility] and was seen by Dr. Jeraldine Loots on 04/26/20.  FYI to Dr. Darnell Level.

## 2020-05-03 DIAGNOSIS — E1165 Type 2 diabetes mellitus with hyperglycemia: Secondary | ICD-10-CM | POA: Diagnosis not present

## 2020-05-07 ENCOUNTER — Telehealth: Payer: Self-pay | Admitting: Family Medicine

## 2020-05-07 NOTE — Telephone Encounter (Signed)
Pt would like a call back. He mention restless leg syndrome driving him crazy but that was it.

## 2020-05-07 NOTE — Telephone Encounter (Signed)
Lvm returning pt's call and asked him to call back.

## 2020-05-10 DIAGNOSIS — G4733 Obstructive sleep apnea (adult) (pediatric): Secondary | ICD-10-CM | POA: Diagnosis not present

## 2020-05-10 NOTE — Telephone Encounter (Signed)
Best number [phone] Pt returned your called

## 2020-05-11 NOTE — Telephone Encounter (Signed)
Returned pt's call.  Pt states RLS seems to be worse than usual.  Also, pt is requesting rx for nausea.  Says he used to have rx for prn.  States he thinks he had some food poisoning from food a neighbor brought him 4-5 days ago. He has had some vomiting but denies any diarrhea, abd pains or fever.

## 2020-05-13 MED ORDER — ONDANSETRON HCL 4 MG PO TABS: 4.0000 mg | ORAL_TABLET | Freq: Three times a day (TID) | ORAL | 0 refills | Status: DC | PRN

## 2020-05-13 NOTE — Telephone Encounter (Signed)
Spoke with pt relaying Dr. Synthia Innocent message.  Pt verbalizes understanding.  States infusion is schedule for 1st or 2nd wk of Sept.  Also, says the oxycodone is helping.  FYI to Dr. Darnell Level.

## 2020-05-13 NOTE — Addendum Note (Signed)
Addended by: [name] on: 05/13/2020 08:46 AM   Modules accepted: Orders

## 2020-05-13 NOTE — Telephone Encounter (Addendum)
zofran sent to pharmacy. Let us know if ongoing nausea/vomiting despite treatment.  For RLS - has he had the iron infusion scheduled by Dr Jeraldine Loots yet? We do expect symptoms to get worse before they will improve as he's dropping dose of gabapentin. Max gabapentin dose recommended by neurology was 700mg  BID. How did he do with oxycodone prescribed by neurology?

## 2020-05-28 DIAGNOSIS — E611 Iron deficiency: Secondary | ICD-10-CM | POA: Diagnosis not present

## 2020-06-01 ENCOUNTER — Encounter: Payer: Self-pay | Admitting: Family Medicine

## 2020-06-01 ENCOUNTER — Ambulatory Visit (INDEPENDENT_AMBULATORY_CARE_PROVIDER_SITE_OTHER): Payer: Medicare HMO | Admitting: Family Medicine

## 2020-06-01 ENCOUNTER — Other Ambulatory Visit: Payer: Self-pay

## 2020-06-01 VITALS — BP 114/60 | HR 67 | Temp 98.1°F | Ht 68.0 in | Wt 192.3 lb

## 2020-06-01 DIAGNOSIS — R269 Unspecified abnormalities of gait and mobility: Secondary | ICD-10-CM

## 2020-06-01 DIAGNOSIS — I10 Essential (primary) hypertension: Secondary | ICD-10-CM

## 2020-06-01 DIAGNOSIS — Z0001 Encounter for general adult medical examination with abnormal findings: Secondary | ICD-10-CM

## 2020-06-01 DIAGNOSIS — E1165 Type 2 diabetes mellitus with hyperglycemia: Secondary | ICD-10-CM | POA: Diagnosis not present

## 2020-06-01 DIAGNOSIS — G4733 Obstructive sleep apnea (adult) (pediatric): Secondary | ICD-10-CM

## 2020-06-01 DIAGNOSIS — E785 Hyperlipidemia, unspecified: Secondary | ICD-10-CM

## 2020-06-01 DIAGNOSIS — E1122 Type 2 diabetes mellitus with diabetic chronic kidney disease: Secondary | ICD-10-CM | POA: Diagnosis not present

## 2020-06-01 DIAGNOSIS — E1169 Type 2 diabetes mellitus with other specified complication: Secondary | ICD-10-CM

## 2020-06-01 DIAGNOSIS — H6121 Impacted cerumen, right ear: Secondary | ICD-10-CM | POA: Diagnosis not present

## 2020-06-01 DIAGNOSIS — I5032 Chronic diastolic (congestive) heart failure: Secondary | ICD-10-CM

## 2020-06-01 DIAGNOSIS — Z23 Encounter for immunization: Secondary | ICD-10-CM | POA: Diagnosis not present

## 2020-06-01 DIAGNOSIS — I69351 Hemiplegia and hemiparesis following cerebral infarction affecting right dominant side: Secondary | ICD-10-CM

## 2020-06-01 DIAGNOSIS — Z8673 Personal history of transient ischemic attack (TIA), and cerebral infarction without residual deficits: Secondary | ICD-10-CM | POA: Diagnosis not present

## 2020-06-01 DIAGNOSIS — G2581 Restless legs syndrome: Secondary | ICD-10-CM | POA: Diagnosis not present

## 2020-06-01 DIAGNOSIS — Z9989 Dependence on other enabling machines and devices: Secondary | ICD-10-CM

## 2020-06-01 DIAGNOSIS — Z7189 Other specified counseling: Secondary | ICD-10-CM | POA: Diagnosis not present

## 2020-06-01 DIAGNOSIS — F331 Major depressive disorder, recurrent, moderate: Secondary | ICD-10-CM

## 2020-06-01 DIAGNOSIS — R259 Unspecified abnormal involuntary movements: Secondary | ICD-10-CM

## 2020-06-01 DIAGNOSIS — G8110 Spastic hemiplegia affecting unspecified side: Secondary | ICD-10-CM

## 2020-06-01 DIAGNOSIS — E1140 Type 2 diabetes mellitus with diabetic neuropathy, unspecified: Secondary | ICD-10-CM

## 2020-06-01 DIAGNOSIS — IMO0002 Reserved for concepts with insufficient information to code with codable children: Secondary | ICD-10-CM

## 2020-06-01 DIAGNOSIS — Z0000 Encounter for general adult medical examination without abnormal findings: Secondary | ICD-10-CM | POA: Insufficient documentation

## 2020-06-01 DIAGNOSIS — N183 Chronic kidney disease, stage 3 unspecified: Secondary | ICD-10-CM

## 2020-06-01 DIAGNOSIS — I69398 Other sequelae of cerebral infarction: Secondary | ICD-10-CM

## 2020-06-01 LAB — POCT GLYCOSYLATED HEMOGLOBIN (HGB A1C): Hemoglobin A1C: 7.9 % — AB (ref 4.0–5.6)

## 2020-06-01 MED ORDER — NYSTATIN 100000 UNIT/GM EX CREA: 1.0000 "application " | TOPICAL_CREAM | Freq: Two times a day (BID) | CUTANEOUS | 0 refills | Status: DC

## 2020-06-01 MED ORDER — FISH OIL 1000 MG PO CAPS: 1.0000 | ORAL_CAPSULE | Freq: Two times a day (BID) | ORAL | 0 refills | Status: DC

## 2020-06-01 MED ORDER — FLUCONAZOLE 150 MG PO TABS: 150.0000 mg | ORAL_TABLET | Freq: Once | ORAL | 0 refills | Status: AC

## 2020-06-01 NOTE — Assessment & Plan Note (Signed)

## 2020-06-01 NOTE — Progress Notes (Signed)
This visit was conducted in person.  BP 114/60 (BP Location: Left Arm, Patient Position: Sitting, Cuff Size: Normal)   Pulse 67   Temp 98.1 F (36.7 C) (Temporal)   Ht 5\' 8"  (1.727 m)   Wt 192 lb 5 oz (87.2 kg)   SpO2 96%   BMI 29.24 kg/m    CC: AMW  Subjective:    Patient ID: James Spurr, MD, male    DOB: 17-May-1931, 86 y.o.   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    Hearing Screening   125Hz  250Hz  500Hz  1000Hz  2000Hz  3000Hz  4000Hz  6000Hz  8000Hz   Right ear:           Left ear:           Comments: Last hearing test, 2 wks ago.  Told he needs hearing aids.  Will be ready 06/04/20.  Vision Screening Comments: Last eye exam, 04/2020.    Office Visit from 06/01/2020 in [facility] at [facility]  PHQ-2 Total Score 0      Fall Risk  06/01/2020 04/22/2020 09/09/2019 03/11/2019 01/22/2019  Falls in the past year? 1 1 1 1  0  Comment - - - - -  Number falls in past yr: 1 1 1 1  -  Comment 4x - - - -  Injury with Fall? 0 0 0 1 -  Comment - - - - -  Risk Factor Category  - - - - -  Comment - - - - -  Risk for fall due to : - - - - -  Risk for fall due to: Comment - - - - -  Follow up - - - - -  Comment - - - - -    H/o L subcortical infarct with resultant chronic R hemiparesis.   Severe RLS - saw neurology Dr Jeraldine Loots, just completed first iron infusion through [facility]. Also recommended tapering gabapentin to max dose of 700mg  bid. Rx oxycodone 5mg  - taking nightly for leg pains. Has been able to taper gabapentin down to 600mg  bid. Will call to cancel [place] appointment.   Recent GI illness possible food poisoning - slowly recovering from this.   DM - recently started jardiance with resultant yeast infection - ongoing trouble with this. Feels this is more candidal - tried monistat bid without benefit. Currently jardiance on hold. Has not been taking ozempic injection.    Preventative: COLONOSCOPY Date: 06/2011 mult polyps adenomatous, severe diverticulosis, rpt 3 yrs [name]). Pt has decided to defer.  Prostate - checked by urology  Flu shot today  Pneumovax - 09/2007, 09/2008.Prevnar 2015.  Td - 06/2016, Tdap 05/2018 COVID vaccine - [place] 09/2019, 10/2019 zostavax- 09/2007 shingrix - discussed Advanced directives: Has advanced directives at home. Will bring copy for chart. Daughter James Bell is HCPOA.  Seat belt use discussed  Sunscreen use discussed. No changing moles on skin.Sees derm.  Non smoker  Alcohol - none  Dentist - q6 mo  Eye exam - q6 mo  Trouble with IADLs 2/2 stroke.   Caffeine: 1 cup coffee/day  Occupation: ophthalmologist, retired Edu: MD  Activity: going to gym 3x/wk with trainer  Diet: some water, fruits/vegetables some, red meat a few times, fish 1x/wk POA - James Bell (Daughter)      Relevant past medical, surgical, family and social history reviewed and updated as indicated. Interim medical history since our last  visit reviewed. Allergies and medications reviewed and updated. Outpatient Medications Prior to Visit  Medication Sig Dispense Refill  . Alpha-Lipoic Acid 600 MG CAPS Take 1 capsule (600 mg total) by mouth daily. 30 capsule 11  . azelastine (ASTELIN) 0.1 % nasal spray Place 1 spray into both nostrils 2 (two) times daily as needed for rhinitis. Use in each nostril as directed 30 mL 6  . BD PEN NEEDLE NANO 2ND GEN 32G X 4 MM MISC     . capsaicin (ZOSTRIX) 0.025 % cream Apply topically 2 (two) times daily. 60 g 0  . clopidogrel (PLAVIX) 75 MG tablet Take 1 tablet (75 mg total) by mouth daily. 90 tablet 3  . Coenzyme Q10 (COQ10) 150 MG CAPS Take 1 capsule by mouth 2 (two) times daily.    . diazepam (VALIUM) 5 MG tablet Take 1 tablet (5 mg total) by mouth every 8 (eight) hours as needed for muscle spasms. 90 tablet 0  . diclofenac sodium (VOLTAREN) 1 % GEL APPLY 2 GRAMS TOPICALLY FOUR TIMES A DAY 300 g 1  .  DULoxetine (CYMBALTA) 60 MG capsule TAKE 1 CAPSULE DAILY 90 capsule 0  . famotidine (PEPCID) 20 MG tablet Take 1 tablet (20 mg total) by mouth at bedtime.    . ferrous sulfate 325 (65 FE) MG tablet Take 1 tablet (325 mg total) by mouth every other day.    Marland Kitchen FOLBEE 2.5-25-1 MG TABS tablet TAKE 1 TABLET DAILY 90 tablet 1  . gabapentin (NEURONTIN) 300 MG capsule Take 2 capsules (600 mg total) by mouth 2 (two) times daily.    Marland Kitchen guaiFENesin-codeine (CHERATUSSIN AC) 100-10 MG/5ML syrup Take 5 mLs by mouth 2 (two) times daily as needed for cough or congestion. 120 mL 0  . insulin degludec (TRESIBA FLEXTOUCH) 200 UNIT/ML FlexTouch Pen Inject 90 Units into the skin at bedtime.    . Insulin Pen Needle (NOVOFINE) 30G X 8 MM MISC Use to inject insulin once daily dx:E11.40 300 each 3  . insulin regular (HUMULIN R) 100 units/mL injection Inject 0.06 mLs (6 Units total) into the skin 3 (three) times daily before meals.    Marland Kitchen levothyroxine (SYNTHROID) 50 MCG tablet Take 1 tablet (50 mcg total) by mouth daily before breakfast. Per endo [name]    . LUMIGAN 0.01 % SOLN Place 1 drop into the right eye at bedtime.    . metoprolol tartrate (LOPRESSOR) 25 MG tablet TAKE ONE-HALF (1/2) TABLET (12.5 MG) AT BEDTIME WITH MORNING DOSE IF NEEDED 90 tablet 3  . metroNIDAZOLE (METROCREAM) 0.75 % cream Apply 1 application topically 3 (three) times daily.    . nitroGLYCERIN (NITROSTAT) 0.4 MG SL tablet DISSOLVE 1 TABLET UNDER THE TONGUE EVERY 5 MINUTES AS NEEDED FOR CHEST PAIN 25 tablet 1  . ondansetron (ZOFRAN) 4 MG tablet Take 1 tablet (4 mg total) by mouth every 8 (eight) hours as needed for nausea or vomiting. 20 tablet 0  . pramipexole (MIRAPEX) 1 MG tablet Take 1 tablet (1 mg total) by mouth at bedtime.    . RABEprazole (ACIPHEX) 20 MG tablet Take 1 tablet (20 mg total) by mouth daily. 90 tablet 0  . rosuvastatin (CRESTOR) 40 MG tablet Take 1 tablet (40 mg total) by mouth daily. 90 tablet 3  . Semaglutide,0.25 or 0.5MG /DOS,  (OZEMPIC, 0.25 OR 0.5 MG/DOSE,) 2 MG/1.5ML SOPN Inject 0.5 mg into the skin once a week.    . traZODone (DESYREL) 50 MG tablet Take 50 mg by mouth at bedtime as  needed for sleep.    . valsartan (DIOVAN) 40 MG tablet TAKE 1 TABLET DAILY 90 tablet 3  . vitamin C (ASCORBIC ACID) 500 MG tablet Take 1,000 mg by mouth daily.     . empagliflozin (JARDIANCE) 10 MG TABS tablet Take 1 tablet (10 mg total) by mouth daily before breakfast.    . gabapentin (NEURONTIN) 300 MG capsule Take 2 capsules (600 mg total) by mouth 4 (four) times daily. (Patient taking differently: Take 600 mg by mouth 4 (four) times daily. Takes 3 capsules 4 times a day) 720 capsule 1  . LUMIGAN 0.01 % SOLN     . omega-3 acid ethyl esters (LOVAZA) 1 g capsule Take 2 capsules (2 g total) by mouth 2 (two) times daily. 360 capsule 1  . Semaglutide (OZEMPIC) 0.25 or 0.5 MG/DOSE SOPN Inject into the skin.     No facility-administered medications prior to visit.     Per HPI unless specifically indicated in ROS section below Review of Systems  Constitutional: Negative for activity change, appetite change, chills, fatigue, fever and unexpected weight change.  HENT: Negative for hearing loss.   Eyes: Negative for visual disturbance.  Respiratory: Negative for cough, chest tightness, shortness of breath and wheezing.   Cardiovascular: Negative for chest pain, palpitations and leg swelling.  Gastrointestinal: Positive for abdominal pain, nausea and vomiting. Negative for abdominal distention, blood in stool, constipation and diarrhea.  Genitourinary: Negative for difficulty urinating and hematuria.  Musculoskeletal: Negative for arthralgias, myalgias and neck pain.  Skin: Negative for rash.  Neurological: Negative for dizziness, seizures, syncope and headaches.  Hematological: Negative for adenopathy. Does not bruise/bleed easily.  Psychiatric/Behavioral: Negative for dysphoric mood. The patient is not nervous/anxious.    Objective:  BP  114/60 (BP Location: Left Arm, Patient Position: Sitting, Cuff Size: Normal)   Pulse 67   Temp 98.1 F (36.7 C) (Temporal)   Ht 5\' 8"  (1.727 m)   Wt 192 lb 5 oz (87.2 kg)   SpO2 96%   BMI 29.24 kg/m   Wt Readings from Last 3 Encounters:  06/01/20 192 lb 5 oz (87.2 kg)  04/22/20 198 lb (89.8 kg)  04/20/20 185 lb (83.9 kg)      Physical Exam Vitals and nursing note reviewed.  Constitutional:      General: He is not in acute distress.    Appearance: Normal appearance. He is well-developed. He is not ill-appearing.  HENT:     Head: Normocephalic and atraumatic.     Right Ear: Hearing, tympanic membrane, ear canal and external ear normal. There is impacted cerumen.     Left Ear: Hearing, tympanic membrane, ear canal and external ear normal.     Ears:     Comments: R cerumen impaction removed with irrigation, plastic curette and alligator forceps. Pt tolerated well.  Eyes:     General: No scleral icterus.    Extraocular Movements: Extraocular movements intact.     Conjunctiva/sclera: Conjunctivae normal.     Pupils: Pupils are equal, round, and reactive to light.  Neck:     Thyroid: No thyroid mass or thyromegaly.     Vascular: No carotid bruit.  Cardiovascular:     Rate and Rhythm: Normal rate and regular rhythm.     Pulses: Normal pulses.          Radial pulses are 2+ on the right side and 2+ on the left side.     Heart sounds: Normal heart sounds. No murmur heard.   Pulmonary:  Effort: Pulmonary effort is normal. No respiratory distress.     Breath sounds: Normal breath sounds. No wheezing, rhonchi or rales.  Abdominal:     General: Abdomen is flat. Bowel sounds are normal. There is no distension.     Palpations: Abdomen is soft. There is no mass.     Tenderness: There is no abdominal tenderness. There is no guarding or rebound.     Hernia: No hernia is present.  Musculoskeletal:        General: Normal range of motion.     Cervical back: Normal range of motion and  neck supple.     Right lower leg: No edema.     Left lower leg: No edema.  Lymphadenopathy:     Cervical: No cervical adenopathy.  Skin:    General: Skin is warm and dry.     Findings: No rash.  Neurological:     General: No focal deficit present.     Mental Status: He is alert and oriented to person, place, and time.     Comments:  CN grossly intact, station and gait intact Recall 3/3 Calculation 5/5 DLROW  Psychiatric:        Mood and Affect: Mood normal.        Behavior: Behavior normal.        Thought Content: Thought content normal.        Judgment: Judgment normal.       Results for orders placed or performed in visit on 06/01/20  POCT glycosylated hemoglobin (Hb A1C)  Result Value Ref Range   Hemoglobin A1C 7.9 (A) 4.0 - 5.6 %   HbA1c POC (<> result, manual entry)     HbA1c, POC (prediabetic range)     HbA1c, POC (controlled diabetic range)     Assessment & Plan:  This visit occurred during the SARS-CoV-2 public health emergency.  Safety protocols were in place, including screening questions prior to the visit, additional usage of staff PPE, and extensive cleaning of exam room while observing appropriate contact time as indicated for disinfecting solutions.   Problem List Items Addressed This Visit    Type 2 diabetes mellitus, uncontrolled, with neuropathy ([place])    Appreciate endo care.  He stopped jardiance due to balanitis - advised notify endo.  POC A1c 7.9% - collected too early today - will not charge patient.       Relevant Medications   Semaglutide,0.25 or 0.5MG /DOS, (OZEMPIC, 0.25 OR 0.5 MG/DOSE,) 2 MG/1.5ML SOPN   Other Relevant Orders   POCT glycosylated hemoglobin (Hb A1C) (Completed)   Spastic hemiparesis affecting dominant side (HCC)   Restless leg syndrome    Appreciate [facility] neuro (Dr Jeraldine Loots) assistance - down to 600mg  gabapentin BID with mirapex 1mg  nightly. Using oxycodone 5mg  nightly as bridge with continued taper off mirapex. Oxycodone  currently being prescribed by [facility]. He has received first iron infusion through [facility].       OSA on CPAP    Continue neuro f/u. Continues regular CPAP use.       Medicare annual wellness visit, subsequent - Primary    I have personally reviewed the Medicare Annual Wellness questionnaire and have noted 1. The patient's medical and social history 2. Their use of alcohol, tobacco or illicit drugs 3. Their current medications and supplements 4. The patient's functional ability including ADL's, fall risks, home safety risks and hearing or visual impairment. Cognitive function has been assessed and addressed as indicated.  5. Diet and physical activity 6.  Evidence for depression or mood disorders The patients weight, height, BMI have been recorded in the chart. I have made referrals, counseling and provided education to the patient based on review of the above and I have provided the pt with a written personalized care plan for preventive services. Provider list updated.. See scanned questionairre as needed for further documentation. Reviewed preventative protocols and updated unless pt declined.       MDD (major depressive disorder), recurrent episode, moderate (HCC)    Chronic, stable. Continue cymbalta with trazodone nightly. Also on levothyroxine through psych.       Involuntary movements    This has resolved with mirapex taper.       Hyperlipidemia associated with type 2 diabetes mellitus ([place])    Continue crestor 40mg  daily. Latest LDL 87. Recommend goal <70. Recheck next visit.  The ASCVD Risk score Mikey Bussing DC Jr., et al., 2013) failed to calculate for the following reasons:   The 2013 ASCVD risk score is only valid for ages 26 to 49   The patient has a prior MI or stroke diagnosis       Relevant Medications   Semaglutide,0.25 or 0.5MG /DOS, (OZEMPIC, 0.25 OR 0.5 MG/DOSE,) 2 MG/1.5ML SOPN   HTN (hypertension)    Chronic, stable. Continue metoprolol and valsartan.        History of CVA (cerebrovascular accident)    Continue statin, plavix.       Hemiparesis affecting right side as late effect of stroke ([place])    H/o L subcortical infarct with residual chronic R hemiparesis - continues plavix and statin. Continues seeing PM&R, referred back to PT earlier this year.       Hearing loss of right ear due to cerumen impaction    Saw audiologist - planning to get hearing aides at end of the week.  Noted R cerumen impaction - pt agrees to irrigation.  Cerumen removed, pt tolerated well.       Healthcare maintenance    Preventative protocols reviewed and updated unless pt declined. Discussed healthy diet and lifestyle.       Gait disturbance, post-stroke   CKD stage 3 secondary to diabetes ([place])    Encouraged glycemic control.       Relevant Medications   Semaglutide,0.25 or 0.5MG /DOS, (OZEMPIC, 0.25 OR 0.5 MG/DOSE,) 2 MG/1.5ML SOPN   Chronic diastolic heart failure, NYHA class 1 (HCC)    Seems euvolemic.       Advanced care planning/counseling discussion    Advanced directives: Has advanced directives at home. Will bring copy for chart. Daughter James Bell is HCPOA.       Other Visit Diagnoses    Need for influenza vaccination       Relevant Orders   Flu Vaccine QUAD High Dose(Fluad) (Completed)       Meds ordered this encounter  Medications  . Omega-3 Fatty Acids (FISH OIL) 1000 MG CAPS    Sig: Take 1 capsule (1,000 mg total) by mouth in the morning and at bedtime.    Refill:  0  . fluconazole (DIFLUCAN) 150 MG tablet    Sig: Take 1 tablet (150 mg total) by mouth once for 1 dose.    Dispense:  1 tablet    Refill:  0  . nystatin cream (MYCOSTATIN)    Sig: Apply 1 application topically 2 (two) times daily.    Dispense:  30 g    Refill:  0   Orders Placed This Encounter  Procedures  . Flu Vaccine  QUAD High Dose(Fluad)  . POCT glycosylated hemoglobin (Hb A1C)    Patient instructions: Flu shot today R ear irrigation  today Call and cancel next week's appointment with neurology [phone].  If interested, check with pharmacy about new 2 shot shingles series (shingrix).  Bring [name] a copy of your living will to update your chart.  Good to see you today. Continue current medicines. Return as needed or in 3-4 months for follow up visit   Follow up plan: Return in about 3 months (around 08/31/2020) for follow up visit.  [name], MD

## 2020-06-01 NOTE — Assessment & Plan Note (Signed)
Advanced directives: Has advanced directives at home. Will bring copy for chart. Daughter James Bell is HCPOA.  

## 2020-06-01 NOTE — Assessment & Plan Note (Signed)
Preventative protocols reviewed and updated unless pt declined. Discussed healthy diet and lifestyle.  

## 2020-06-01 NOTE — Patient Instructions (Addendum)
Flu shot today R ear irrigation today Call and cancel next week's appointment with neurology [phone].  If interested, check with pharmacy about new 2 shot shingles series (shingrix).  Bring [name] a copy of your living will to update your chart.  Good to see you today. Continue current medicines. Return as needed or in 3-4 months for follow up visit   Health Maintenance After Age 84 After age 95, you are at a higher risk for certain long-term diseases and infections as well as injuries from falls. Falls are a major cause of broken bones and head injuries in people who are older than age 41. Getting regular preventive care can help to keep you healthy and well. Preventive care includes getting regular testing and making lifestyle changes as recommended by your health care provider. Talk with your health care provider about:  Which screenings and tests you should have. A screening is a test that checks for a disease when you have no symptoms.  A diet and exercise plan that is right for you. What should I know about screenings and tests to prevent falls? Screening and testing are the best ways to find a health problem early. Early diagnosis and treatment give you the best chance of managing medical conditions that are common after age 71. Certain conditions and lifestyle choices may make you more likely to have a fall. Your health care provider may recommend:  Regular vision checks. Poor vision and conditions such as cataracts can make you more likely to have a fall. If you wear glasses, make sure to get your prescription updated if your vision changes.  Medicine review. Work with your health care provider to regularly review all of the medicines you are taking, including over-the-counter medicines. Ask your health care provider about any side effects that may make you more likely to have a fall. Tell your health care provider if any medicines that you take make you feel dizzy or  sleepy.  Osteoporosis screening. Osteoporosis is a condition that causes the bones to get weaker. This can make the bones weak and cause them to break more easily.  Blood pressure screening. Blood pressure changes and medicines to control blood pressure can make you feel dizzy.  Strength and balance checks. Your health care provider may recommend certain tests to check your strength and balance while standing, walking, or changing positions.  Foot health exam. Foot pain and numbness, as well as not wearing proper footwear, can make you more likely to have a fall.  Depression screening. You may be more likely to have a fall if you have a fear of falling, feel emotionally low, or feel unable to do activities that you used to do.  Alcohol use screening. Using too much alcohol can affect your balance and may make you more likely to have a fall. What actions can I take to lower my risk of falls? General instructions  Talk with your health care provider about your risks for falling. Tell your health care provider if: ? You fall. Be sure to tell your health care provider about all falls, even ones that seem minor. ? You feel dizzy, sleepy, or off-balance.  Take over-the-counter and prescription medicines only as told by your health care provider. These include any supplements.  Eat a healthy diet and maintain a healthy weight. A healthy diet includes low-fat dairy products, low-fat (lean) meats, and fiber from whole grains, beans, and lots of fruits and vegetables. Home safety  Remove any tripping hazards,  such as rugs, cords, and clutter.  Install safety equipment such as grab bars in bathrooms and safety rails on stairs.  Keep rooms and walkways well-lit. Activity   Follow a regular exercise program to stay fit. This will help you maintain your balance. Ask your health care provider what types of exercise are appropriate for you.  If you need a cane or walker, use it as recommended by  your health care provider.  Wear supportive shoes that have nonskid soles. Lifestyle  Do not drink alcohol if your health care provider tells you not to drink.  If you drink alcohol, limit how much you have: ? 0-1 drink a day for women. ? 0-2 drinks a day for men.  Be aware of how much alcohol is in your drink. In the [place] one drink equals one typical bottle of beer (12 oz), one-half glass of wine (5 oz), or one shot of hard liquor (1 oz).  Do not use any products that contain nicotine or tobacco, such as cigarettes and e-cigarettes. If you need help quitting, ask your health care provider. Summary  Having a healthy lifestyle and getting preventive care can help to protect your health and wellness after age 26.  Screening and testing are the best way to find a health problem early and help you avoid having a fall. Early diagnosis and treatment give you the best chance for managing medical conditions that are more common for people who are older than age 54.  Falls are a major cause of broken bones and head injuries in people who are older than age 35. Take precautions to prevent a fall at home.  Work with your health care provider to learn what changes you can make to improve your health and wellness and to prevent falls. This information is not intended to replace advice given to you by your health care provider. Make sure you discuss any questions you have with your health care provider. Document Revised: 12/26/2018 Document Reviewed: 07/18/2017 Elsevier Patient Education  2020 [employer].

## 2020-06-03 NOTE — Assessment & Plan Note (Signed)
Encouraged glycemic control.

## 2020-06-03 NOTE — Assessment & Plan Note (Signed)
Seems euvolemic.  

## 2020-06-03 NOTE — Assessment & Plan Note (Signed)
Continue neuro f/u. Continues regular CPAP use.

## 2020-06-03 NOTE — Assessment & Plan Note (Signed)
This has resolved with mirapex taper.

## 2020-06-03 NOTE — Assessment & Plan Note (Addendum)
Continue statin, plavix.  

## 2020-06-03 NOTE — Assessment & Plan Note (Addendum)
Appreciate [facility] neuro (Dr Jeraldine Loots) assistance - down to 600mg  gabapentin BID with mirapex 1mg  nightly. Using oxycodone 5mg  nightly as bridge with continued taper off mirapex. Oxycodone currently being prescribed by [facility]. He has received first iron infusion through [facility].

## 2020-06-03 NOTE — Assessment & Plan Note (Signed)
Chronic, stable. Continue cymbalta with trazodone nightly. Also on levothyroxine through psych.

## 2020-06-03 NOTE — Assessment & Plan Note (Addendum)
H/o L subcortical infarct with residual chronic R hemiparesis - continues plavix and statin. Continues seeing PM&R, referred back to PT earlier this year.

## 2020-06-03 NOTE — Assessment & Plan Note (Signed)
Chronic, stable. Continue metoprolol and valsartan.

## 2020-06-03 NOTE — Assessment & Plan Note (Signed)
Continue crestor 40mg  daily. Latest LDL 87. Recommend goal <70. Recheck next visit.  The ASCVD Risk score Mikey Bussing DC Jr., et al., 2013) failed to calculate for the following reasons:   The 2013 ASCVD risk score is only valid for ages 76 to 42   The patient has a prior MI or stroke diagnosis

## 2020-06-03 NOTE — Assessment & Plan Note (Signed)
Saw audiologist - planning to get hearing aides at end of the week.  Noted R cerumen impaction - pt agrees to irrigation.  Cerumen removed, pt tolerated well.

## 2020-06-03 NOTE — Assessment & Plan Note (Signed)
Appreciate endo care.  He stopped jardiance due to balanitis - advised notify endo.  POC A1c 7.9% - collected too early today - will not charge patient.

## 2020-06-04 ENCOUNTER — Telehealth: Payer: Self-pay

## 2020-06-04 DIAGNOSIS — E611 Iron deficiency: Secondary | ICD-10-CM | POA: Diagnosis not present

## 2020-06-04 MED ORDER — DICLOFENAC SODIUM 1 % EX GEL
2.0000 g | Freq: Three times a day (TID) | CUTANEOUS | 1 refills | Status: DC | PRN
Start: 2020-06-04 — End: 2021-04-01

## 2020-06-04 NOTE — Telephone Encounter (Signed)
Patient contacted the office ands states that he is having issues and pain with his right shoulder. He states he would like some voltaren cream sent in for him. Please advise.

## 2020-06-04 NOTE — Telephone Encounter (Addendum)
plz notify voltaren sent to local pharmacy. Could see Dr Lorelei Pont if shoulder not improving.

## 2020-06-04 NOTE — Telephone Encounter (Signed)
Lvm, on home #, asking pt to call back.  Need to relay Dr. Synthia Innocent message.

## 2020-06-04 NOTE — Addendum Note (Signed)
Addended by: [name] on: 06/04/2020 02:11 PM   Modules accepted: Orders

## 2020-06-07 ENCOUNTER — Institutional Professional Consult (permissible substitution): Payer: Medicare HMO | Admitting: Neurology

## 2020-06-07 NOTE — Telephone Encounter (Signed)
Spoke with pt relaying Dr. Synthia Innocent message.  Pt verbalizes understanding.  Scheduled OV with Dr. Lorelei Pont 06/16/20 at 10:40.

## 2020-06-08 DIAGNOSIS — E1142 Type 2 diabetes mellitus with diabetic polyneuropathy: Secondary | ICD-10-CM | POA: Diagnosis not present

## 2020-06-08 DIAGNOSIS — E039 Hypothyroidism, unspecified: Secondary | ICD-10-CM | POA: Diagnosis not present

## 2020-06-08 DIAGNOSIS — Z794 Long term (current) use of insulin: Secondary | ICD-10-CM | POA: Diagnosis not present

## 2020-06-08 DIAGNOSIS — I6930 Unspecified sequelae of cerebral infarction: Secondary | ICD-10-CM | POA: Diagnosis not present

## 2020-06-08 DIAGNOSIS — E785 Hyperlipidemia, unspecified: Secondary | ICD-10-CM | POA: Diagnosis not present

## 2020-06-10 DIAGNOSIS — G4733 Obstructive sleep apnea (adult) (pediatric): Secondary | ICD-10-CM | POA: Diagnosis not present

## 2020-06-15 NOTE — Progress Notes (Signed)
James Bell T. 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 Shoulder Pain    Left    This visit occurred during the SARS-CoV-2 public health emergency.  Safety protocols were in place, including screening questions prior to the visit, additional usage of staff PPE, and extensive cleaning of exam room while observing appropriate contact time as indicated for disinfecting solutions.   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 who presents with the following:  He presents with L sided shoulder pain:  Also complains of his hip.  Had a stroke on the R side and has pain mostly in the back buy mainly in the back and a minimal amount in the groin. No numbness or thingling in his leg.  Decreased steadiness. Has done some pt with the since his stroke. At baseline has some decreased strength on the right leg as well. He does complain of pain in the posterior pelvis and some in the lowest part of the back.  Has some left shoulder pain.  He does have some continued decreased functionality and lost motion at the right shoulder, and this is been longstanding since his stroke.  At this point he does have some pain, some loss of motion and pain with abduction and flexion as well as some rotational movements of the left shoulder.  He has not had any specific injury that he can recall.  Walking is limited.   Exercise has not helped much  He does occasionally take Aleve.  He does take Plavix chronically after his stroke.  Review of Systems is noted in the HPI, as appropriate    Objective:   BP 102/60   Pulse 73   Temp 98.1 F (36.7 C) (Temporal)   Ht 5\' 8"  (1.727 m)   Wt 189 lb 4 oz (85.8 kg)   SpO2 99%   BMI 28.78 kg/m    GEN: No acute distress; alert,appropriate. PULM: Breathing comfortably in no respiratory distress PSYCH: Normally interactive.    Left shoulder.  Flexion to 167 degrees and abduction to 170.  With the shoulder abducted to 90 degrees he is able to externally rotate to approximately 70 degrees and has approximately 15 degrees of internal range of motion.  Strength is 4+/5 in all directions.  He does have some tenderness at the [facility] joint and to a lesser extent in the bicipital groove He does have some pain with Neer testing as well as with Michel Bickers and Jobe testing. He does have some grinding with manipulation of the shoulder as well.  HIP EXAM: SIDE: Left ROM: Abduction, Flexion, Internal and External range of motion: Minimal impairment Pain with terminal IROM and EROM: None GTB: NT SLR: NEG Knees: No effusion Piriformis: NT at direct palpation Str: flexion: 4+/5 abduction: 4+/5 adduction: 4+/5 Strength testing non-tender  He does have some tenderness at the SI joints as well as in the lowest part of the back approximately from L4-S1, most on the left.  Radiology: DG Shoulder Left  Result Date: 06/17/2020 CLINICAL DATA:  Left shoulder pain.  No  known injury. EXAM: LEFT SHOULDER - 2+ VIEW COMPARISON:  None. FINDINGS: There is no acute bony or joint abnormality. The patient has moderate acromioclavicular osteoarthritis. The glenohumeral joint appears normal. No focal bony lesion. Imaged lung parenchyma is clear. IMPRESSION: Moderate acromioclavicular osteoarthritis.  Otherwise negative. Electronically Signed   By: Inge Rise M.D.   On: 06/17/2020 15:58   DG HIP UNILAT W OR W/O PELVIS 2-3 VIEWS LEFT  Result Date: 06/17/2020 CLINICAL DATA:  Left hip pain. EXAM: DG HIP (WITH OR WITHOUT PELVIS) 2-3V LEFT COMPARISON:   None. FINDINGS: There is no evidence of hip fracture or dislocation. There is no evidence of arthropathy or other focal bone abnormality. IMPRESSION: Negative exam. Electronically Signed   By: Inge Rise M.D.   On: 06/17/2020 15:59     Assessment and Plan:     ICD-10-CM   1. Left shoulder pain, unspecified chronicity  M25.512 DG Shoulder Left  2. Left hip pain  M25.552 DG HIP UNILAT W OR W/O PELVIS 2-3 VIEWS LEFT    CANCELED: DG Hip Unilat W OR W/O Pelvis 1V Left   Overuse with the arm as well as the lower extremity given his decreased functionality on the right at the arm as well as the leg as cause some rotator cuff tendinopathy on the left as well as some pain in the predominantly lower back.  Suspect there is also some fatigue with the muscles of the hip, but at this point they are not tender in the office and his strength is fairly well-preserved at age 67.  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  He is reassured that he does not have end-stage degenerative joint disease.  I think that it is reasonable to have him have some Celebrex on hand, which have would have a decreased risk of a GI bleed compared to Aleve.  He understands that he should use this sparingly, and that there is some risk, but he is willing to accept that risk.  From a quality of life standpoint I think that that is a reasonable thing to offer.  Meds ordered this encounter  Medications  . celecoxib (CELEBREX) 200 MG capsule    Sig: Take 1 capsule (200 mg total) by mouth daily.    Dispense:  90 capsule    Refill:  1   Medications Discontinued During This Encounter  Medication Reason  . Semaglutide,0.25 or 0.5MG /DOS, (OZEMPIC, 0.25 OR 0.5 MG/DOSE,) 2 MG/1.5ML SOPN Change in therapy  . SYNTHROID 25 MCG tablet Change in therapy   Orders Placed This Encounter  Procedures  . DG Shoulder Left  . DG HIP UNILAT W OR W/O PELVIS 2-3 VIEWS LEFT    Follow-up: No  follow-ups on file.  Signed,  James Deed. Kaula Klenke, MD   Outpatient Encounter Medications as of 06/16/2020  Medication Sig  . Alpha-Lipoic Acid 600 MG CAPS Take 1 capsule (600 mg total) by mouth daily.  Marland Kitchen azelastine (ASTELIN) 0.1 % nasal spray Place 1 spray into both nostrils 2 (two) times daily as needed for rhinitis. Use in each nostril as directed  . BD PEN NEEDLE NANO 2ND GEN 32G X 4 MM MISC   . capsaicin (ZOSTRIX) 0.025 % cream Apply topically 2 (two) times daily.  . clopidogrel (PLAVIX) 75 MG tablet Take 1 tablet (75 mg total) by mouth daily.  . Coenzyme Q10 (COQ10) 150 MG CAPS Take 1 capsule by mouth 2 (two) times daily.  Marland Kitchen  diazepam (VALIUM) 5 MG tablet Take 1 tablet (5 mg total) by mouth every 8 (eight) hours as needed for muscle spasms.  . diclofenac Sodium (VOLTAREN) 1 % GEL Apply 2 g topically 3 (three) times daily as needed.  . DULoxetine (CYMBALTA) 60 MG capsule TAKE 1 CAPSULE DAILY  . famotidine (PEPCID) 20 MG tablet Take 1 tablet (20 mg total) by mouth at bedtime.  . ferrous sulfate 325 (65 FE) MG tablet Take 1 tablet (325 mg total) by mouth every other day.  Marland Kitchen FOLBEE 2.5-25-1 MG TABS tablet TAKE 1 TABLET DAILY  . gabapentin (NEURONTIN) 300 MG capsule Take 2 capsules (600 mg total) by mouth 2 (two) times daily.  Marland Kitchen guaiFENesin-codeine (CHERATUSSIN AC) 100-10 MG/5ML syrup Take 5 mLs by mouth 2 (two) times daily as needed for cough or congestion.  . insulin degludec (TRESIBA FLEXTOUCH) 200 UNIT/ML FlexTouch Pen Inject 90 Units into the skin at bedtime.  . Insulin Pen Needle (NOVOFINE) 30G X 8 MM MISC Use to inject insulin once daily dx:E11.40  . insulin regular (HUMULIN R) 100 units/mL injection Inject 0.06 mLs (6 Units total) into the skin 3 (three) times daily before meals.  Marland Kitchen levothyroxine (SYNTHROID) 50 MCG tablet Take 1 tablet (50 mcg total) by mouth daily before breakfast. Per endo [name]  . LUMIGAN 0.01 % SOLN Place 1 drop into the right eye at bedtime.  . metoprolol  tartrate (LOPRESSOR) 25 MG tablet TAKE ONE-HALF (1/2) TABLET (12.5 MG) AT BEDTIME WITH MORNING DOSE IF NEEDED  . metroNIDAZOLE (METROCREAM) 0.75 % cream Apply 1 application topically 3 (three) times daily.  . nitroGLYCERIN (NITROSTAT) 0.4 MG SL tablet DISSOLVE 1 TABLET UNDER THE TONGUE EVERY 5 MINUTES AS NEEDED FOR CHEST PAIN  . nystatin cream (MYCOSTATIN) Apply 1 application topically 2 (two) times daily.  . Omega-3 Fatty Acids (FISH OIL) 1000 MG CAPS Take 1 capsule (1,000 mg total) by mouth in the morning and at bedtime.  . ondansetron (ZOFRAN) 4 MG tablet Take 1 tablet (4 mg total) by mouth every 8 (eight) hours as needed for nausea or vomiting.  Marland Kitchen oxyCODONE (OXY IR/ROXICODONE) 5 MG immediate release tablet Take by mouth.  Marland Kitchen OZEMPIC, 1 MG/DOSE, 2 MG/1.5ML SOPN SMARTSIG:0.75 Milliliter(s) SUB-Q Once a Week  . pramipexole (MIRAPEX) 1 MG tablet Take 1 tablet (1 mg total) by mouth at bedtime.  . RABEprazole (ACIPHEX) 20 MG tablet Take 1 tablet (20 mg total) by mouth daily.  . rosuvastatin (CRESTOR) 40 MG tablet Take 1 tablet (40 mg total) by mouth daily.  . traZODone (DESYREL) 50 MG tablet Take 50 mg by mouth at bedtime as needed for sleep.  . valsartan (DIOVAN) 40 MG tablet TAKE 1 TABLET DAILY  . vitamin C (ASCORBIC ACID) 500 MG tablet Take 1,000 mg by mouth daily.   . celecoxib (CELEBREX) 200 MG capsule Take 1 capsule (200 mg total) by mouth daily.  . [DISCONTINUED] Semaglutide,0.25 or 0.5MG /DOS, (OZEMPIC, 0.25 OR 0.5 MG/DOSE,) 2 MG/1.5ML SOPN Inject 0.5 mg into the skin once a week.  . [DISCONTINUED] SYNTHROID 25 MCG tablet Take by mouth.   No facility-administered encounter medications on file as of 06/16/2020.

## 2020-06-16 ENCOUNTER — Other Ambulatory Visit: Payer: Self-pay

## 2020-06-16 ENCOUNTER — Ambulatory Visit (INDEPENDENT_AMBULATORY_CARE_PROVIDER_SITE_OTHER)
Admission: RE | Admit: 2020-06-16 | Discharge: 2020-06-16 | Disposition: A | Discharge status: Home / Self Care | Payer: Medicare HMO | Source: Ambulatory Visit | Attending: Family Medicine | Admitting: Family Medicine

## 2020-06-16 ENCOUNTER — Ambulatory Visit (INDEPENDENT_AMBULATORY_CARE_PROVIDER_SITE_OTHER): Payer: Medicare HMO | Admitting: Family Medicine

## 2020-06-16 ENCOUNTER — Encounter: Payer: Self-pay | Admitting: Family Medicine

## 2020-06-16 VITALS — BP 102/60 | HR 73 | Temp 98.1°F | Ht 68.0 in | Wt 189.2 lb

## 2020-06-16 DIAGNOSIS — M19012 Primary osteoarthritis, left shoulder: Secondary | ICD-10-CM | POA: Diagnosis not present

## 2020-06-16 DIAGNOSIS — M25552 Pain in left hip: Secondary | ICD-10-CM

## 2020-06-16 DIAGNOSIS — Z7902 Long term (current) use of antithrombotics/antiplatelets: Secondary | ICD-10-CM

## 2020-06-16 DIAGNOSIS — M25512 Pain in left shoulder: Secondary | ICD-10-CM

## 2020-06-16 MED ORDER — CELECOXIB 200 MG PO CAPS: 200.0000 mg | ORAL_CAPSULE | Freq: Every day | ORAL | 1 refills | Status: DC

## 2020-06-23 ENCOUNTER — Other Ambulatory Visit: Payer: Self-pay

## 2020-06-23 DIAGNOSIS — H40001 Preglaucoma, unspecified, right eye: Secondary | ICD-10-CM | POA: Diagnosis not present

## 2020-06-23 NOTE — Telephone Encounter (Signed)
Pt needs a refill on ozempic sent to [facility]. He said they don't have the prescription.

## 2020-06-26 NOTE — Telephone Encounter (Signed)
We haven't been filling this medicine but rather endocrinology [name]. Also don't know if he's taking 0.5mg  or 1mg  weekly. I believe he should be on 0.5mg  weekly dose.

## 2020-06-30 NOTE — Telephone Encounter (Signed)
Spoke with pt relaying Dr. Synthia Innocent message.  Pt verbalizes understanding.  States he does 1 mg wkly.  Says he will contact Dr. Cindra Eves office for correct refill.

## 2020-07-01 ENCOUNTER — Encounter (HOSPITAL_COMMUNITY): Payer: Self-pay

## 2020-07-01 ENCOUNTER — Emergency Department (HOSPITAL_COMMUNITY): Payer: Medicare Other

## 2020-07-01 ENCOUNTER — Inpatient Hospital Stay (HOSPITAL_COMMUNITY)
Admission: EM | Admit: 2020-07-01 | Discharge: 2020-07-06 | DRG: 864 | Disposition: A | Discharge status: Skilled Nursing Facility | Payer: Medicare Other | Attending: Internal Medicine | Admitting: Internal Medicine

## 2020-07-01 DIAGNOSIS — W010XXA Fall on same level from slipping, tripping and stumbling without subsequent striking against object, initial encounter: Secondary | ICD-10-CM | POA: Diagnosis present

## 2020-07-01 DIAGNOSIS — E860 Dehydration: Secondary | ICD-10-CM | POA: Diagnosis present

## 2020-07-01 DIAGNOSIS — Z79890 Hormone replacement therapy (postmenopausal): Secondary | ICD-10-CM

## 2020-07-01 DIAGNOSIS — J90 Pleural effusion, not elsewhere classified: Secondary | ICD-10-CM | POA: Diagnosis not present

## 2020-07-01 DIAGNOSIS — Z9181 History of falling: Secondary | ICD-10-CM

## 2020-07-01 DIAGNOSIS — R42 Dizziness and giddiness: Secondary | ICD-10-CM | POA: Diagnosis not present

## 2020-07-01 DIAGNOSIS — N401 Enlarged prostate with lower urinary tract symptoms: Secondary | ICD-10-CM | POA: Diagnosis present

## 2020-07-01 DIAGNOSIS — Z20822 Contact with and (suspected) exposure to covid-19: Secondary | ICD-10-CM | POA: Diagnosis present

## 2020-07-01 DIAGNOSIS — Z7401 Bed confinement status: Secondary | ICD-10-CM | POA: Diagnosis not present

## 2020-07-01 DIAGNOSIS — E11649 Type 2 diabetes mellitus with hypoglycemia without coma: Secondary | ICD-10-CM | POA: Diagnosis not present

## 2020-07-01 DIAGNOSIS — R1312 Dysphagia, oropharyngeal phase: Secondary | ICD-10-CM | POA: Diagnosis not present

## 2020-07-01 DIAGNOSIS — E785 Hyperlipidemia, unspecified: Secondary | ICD-10-CM | POA: Diagnosis not present

## 2020-07-01 DIAGNOSIS — Z833 Family history of diabetes mellitus: Secondary | ICD-10-CM

## 2020-07-01 DIAGNOSIS — Z85828 Personal history of other malignant neoplasm of skin: Secondary | ICD-10-CM | POA: Diagnosis not present

## 2020-07-01 DIAGNOSIS — R509 Fever, unspecified: Secondary | ICD-10-CM | POA: Diagnosis present

## 2020-07-01 DIAGNOSIS — W19XXXA Unspecified fall, initial encounter: Secondary | ICD-10-CM | POA: Diagnosis not present

## 2020-07-01 DIAGNOSIS — G2581 Restless legs syndrome: Secondary | ICD-10-CM | POA: Diagnosis not present

## 2020-07-01 DIAGNOSIS — D696 Thrombocytopenia, unspecified: Secondary | ICD-10-CM | POA: Diagnosis not present

## 2020-07-01 DIAGNOSIS — Y92009 Unspecified place in unspecified non-institutional (private) residence as the place of occurrence of the external cause: Secondary | ICD-10-CM | POA: Diagnosis not present

## 2020-07-01 DIAGNOSIS — E1122 Type 2 diabetes mellitus with diabetic chronic kidney disease: Secondary | ICD-10-CM | POA: Diagnosis not present

## 2020-07-01 DIAGNOSIS — E1140 Type 2 diabetes mellitus with diabetic neuropathy, unspecified: Secondary | ICD-10-CM | POA: Diagnosis present

## 2020-07-01 DIAGNOSIS — F32A Depression, unspecified: Secondary | ICD-10-CM | POA: Diagnosis present

## 2020-07-01 DIAGNOSIS — R2689 Other abnormalities of gait and mobility: Secondary | ICD-10-CM | POA: Diagnosis not present

## 2020-07-01 DIAGNOSIS — G43909 Migraine, unspecified, not intractable, without status migrainosus: Secondary | ICD-10-CM | POA: Diagnosis not present

## 2020-07-01 DIAGNOSIS — R110 Nausea: Secondary | ICD-10-CM | POA: Diagnosis not present

## 2020-07-01 DIAGNOSIS — G4733 Obstructive sleep apnea (adult) (pediatric): Secondary | ICD-10-CM | POA: Diagnosis present

## 2020-07-01 DIAGNOSIS — Z794 Long term (current) use of insulin: Secondary | ICD-10-CM

## 2020-07-01 DIAGNOSIS — M4312 Spondylolisthesis, cervical region: Secondary | ICD-10-CM | POA: Diagnosis not present

## 2020-07-01 DIAGNOSIS — Z841 Family history of disorders of kidney and ureter: Secondary | ICD-10-CM

## 2020-07-01 DIAGNOSIS — I129 Hypertensive chronic kidney disease with stage 1 through stage 4 chronic kidney disease, or unspecified chronic kidney disease: Secondary | ICD-10-CM | POA: Diagnosis present

## 2020-07-01 DIAGNOSIS — Z7902 Long term (current) use of antithrombotics/antiplatelets: Secondary | ICD-10-CM

## 2020-07-01 DIAGNOSIS — M2578 Osteophyte, vertebrae: Secondary | ICD-10-CM | POA: Diagnosis not present

## 2020-07-01 DIAGNOSIS — E871 Hypo-osmolality and hyponatremia: Secondary | ICD-10-CM | POA: Diagnosis present

## 2020-07-01 DIAGNOSIS — M542 Cervicalgia: Secondary | ICD-10-CM | POA: Diagnosis not present

## 2020-07-01 DIAGNOSIS — Z79899 Other long term (current) drug therapy: Secondary | ICD-10-CM

## 2020-07-01 DIAGNOSIS — I69351 Hemiplegia and hemiparesis following cerebral infarction affecting right dominant side: Secondary | ICD-10-CM

## 2020-07-01 DIAGNOSIS — E1169 Type 2 diabetes mellitus with other specified complication: Secondary | ICD-10-CM | POA: Diagnosis present

## 2020-07-01 DIAGNOSIS — W19XXXD Unspecified fall, subsequent encounter: Secondary | ICD-10-CM | POA: Diagnosis not present

## 2020-07-01 DIAGNOSIS — H9113 Presbycusis, bilateral: Secondary | ICD-10-CM | POA: Diagnosis present

## 2020-07-01 DIAGNOSIS — N529 Male erectile dysfunction, unspecified: Secondary | ICD-10-CM | POA: Diagnosis present

## 2020-07-01 DIAGNOSIS — R488 Other symbolic dysfunctions: Secondary | ICD-10-CM | POA: Diagnosis not present

## 2020-07-01 DIAGNOSIS — R296 Repeated falls: Secondary | ICD-10-CM | POA: Diagnosis not present

## 2020-07-01 DIAGNOSIS — R5381 Other malaise: Secondary | ICD-10-CM | POA: Diagnosis not present

## 2020-07-01 DIAGNOSIS — K219 Gastro-esophageal reflux disease without esophagitis: Secondary | ICD-10-CM | POA: Diagnosis not present

## 2020-07-01 DIAGNOSIS — N1832 Chronic kidney disease, stage 3b: Secondary | ICD-10-CM | POA: Diagnosis present

## 2020-07-01 DIAGNOSIS — L719 Rosacea, unspecified: Secondary | ICD-10-CM | POA: Diagnosis present

## 2020-07-01 DIAGNOSIS — Z888 Allergy status to other drugs, medicaments and biological substances status: Secondary | ICD-10-CM

## 2020-07-01 DIAGNOSIS — G8110 Spastic hemiplegia affecting unspecified side: Secondary | ICD-10-CM | POA: Diagnosis not present

## 2020-07-01 DIAGNOSIS — S199XXA Unspecified injury of neck, initial encounter: Secondary | ICD-10-CM | POA: Diagnosis not present

## 2020-07-01 DIAGNOSIS — I10 Essential (primary) hypertension: Secondary | ICD-10-CM | POA: Diagnosis present

## 2020-07-01 DIAGNOSIS — S0990XA Unspecified injury of head, initial encounter: Secondary | ICD-10-CM | POA: Diagnosis not present

## 2020-07-01 DIAGNOSIS — R5083 Postvaccination fever: Secondary | ICD-10-CM | POA: Diagnosis not present

## 2020-07-01 DIAGNOSIS — G4489 Other headache syndrome: Secondary | ICD-10-CM | POA: Diagnosis not present

## 2020-07-01 DIAGNOSIS — R52 Pain, unspecified: Secondary | ICD-10-CM | POA: Diagnosis not present

## 2020-07-01 DIAGNOSIS — Z791 Long term (current) use of non-steroidal anti-inflammatories (NSAID): Secondary | ICD-10-CM

## 2020-07-01 DIAGNOSIS — T50B95A Adverse effect of other viral vaccines, initial encounter: Secondary | ICD-10-CM | POA: Diagnosis not present

## 2020-07-01 DIAGNOSIS — K5730 Diverticulosis of large intestine without perforation or abscess without bleeding: Secondary | ICD-10-CM | POA: Diagnosis not present

## 2020-07-01 DIAGNOSIS — Z823 Family history of stroke: Secondary | ICD-10-CM

## 2020-07-01 DIAGNOSIS — M47812 Spondylosis without myelopathy or radiculopathy, cervical region: Secondary | ICD-10-CM | POA: Diagnosis not present

## 2020-07-01 DIAGNOSIS — N138 Other obstructive and reflux uropathy: Secondary | ICD-10-CM | POA: Diagnosis not present

## 2020-07-01 DIAGNOSIS — R531 Weakness: Secondary | ICD-10-CM | POA: Diagnosis not present

## 2020-07-01 DIAGNOSIS — M6281 Muscle weakness (generalized): Secondary | ICD-10-CM | POA: Diagnosis not present

## 2020-07-01 DIAGNOSIS — M2550 Pain in unspecified joint: Secondary | ICD-10-CM | POA: Diagnosis not present

## 2020-07-01 DIAGNOSIS — R279 Unspecified lack of coordination: Secondary | ICD-10-CM | POA: Diagnosis not present

## 2020-07-01 LAB — COMPREHENSIVE METABOLIC PANEL
ALT: 18 U/L (ref 0–44)
AST: 24 U/L (ref 15–41)
Albumin: 3.3 g/dL — ABNORMAL LOW (ref 3.5–5.0)
Alkaline Phosphatase: 40 U/L (ref 38–126)
Anion gap: 9 (ref 5–15)
BUN: 20 mg/dL (ref 8–23)
CO2: 25 mmol/L (ref 22–32)
Calcium: 8.9 mg/dL (ref 8.9–10.3)
Chloride: 100 mmol/L (ref 98–111)
Creatinine, Ser: 1.38 mg/dL — ABNORMAL HIGH (ref 0.61–1.24)
GFR, Estimated: 46 mL/min — ABNORMAL LOW (ref >60)
Glucose, Bld: 139 mg/dL — ABNORMAL HIGH (ref 70–99)
Potassium: 4.3 mmol/L (ref 3.5–5.1)
Sodium: 134 mmol/L — ABNORMAL LOW (ref 135–145)
Total Bilirubin: 0.7 mg/dL (ref 0.3–1.2)
Total Protein: 6.6 g/dL (ref 6.5–8.1)

## 2020-07-01 LAB — CBC WITH DIFFERENTIAL/PLATELET
Abs Immature Granulocytes: 0.02 10*3/uL (ref 0.00–0.07)
Basophils Absolute: 0 10*3/uL (ref 0.0–0.1)
Basophils Relative: 0 %
Eosinophils Absolute: 0.1 10*3/uL (ref 0.0–0.5)
Eosinophils Relative: 1 %
HCT: 42 % (ref 39.0–52.0)
Hemoglobin: 13.5 g/dL (ref 13.0–17.0)
Immature Granulocytes: 0 %
Lymphocytes Relative: 9 %
Lymphs Abs: 0.7 10*3/uL (ref 0.7–4.0)
MCH: 28.9 pg (ref 26.0–34.0)
MCHC: 32.1 g/dL (ref 30.0–36.0)
MCV: 89.9 fL (ref 80.0–100.0)
Monocytes Absolute: 0.8 10*3/uL (ref 0.1–1.0)
Monocytes Relative: 10 %
Neutro Abs: 6.2 10*3/uL (ref 1.7–7.7)
Neutrophils Relative %: 80 %
Platelets: 143 10*3/uL — ABNORMAL LOW (ref 150–400)
RBC: 4.67 MIL/uL (ref 4.22–5.81)
RDW: 13.3 % (ref 11.5–15.5)
WBC: 7.8 10*3/uL (ref 4.0–10.5)
nRBC: 0 % (ref 0.0–0.2)

## 2020-07-01 LAB — PROTIME-INR
INR: 1.1 (ref 0.8–1.2)
Prothrombin Time: 14.1 seconds (ref 11.4–15.2)

## 2020-07-01 LAB — LIPASE, BLOOD: Lipase: 31 U/L (ref 11–51)

## 2020-07-01 MED ORDER — SODIUM CHLORIDE 0.9 % IV BOLUS
1000.0000 mL | Freq: Once | INTRAVENOUS | Status: AC
  Administered 2020-07-01: 1000 mL via INTRAVENOUS

## 2020-07-01 MED ORDER — IOHEXOL 300 MG/ML  SOLN
80.0000 mL | Freq: Once | INTRAMUSCULAR | Status: AC | PRN
  Administered 2020-07-01: 80 mL via INTRAVENOUS

## 2020-07-01 MED ORDER — HYDROCODONE-ACETAMINOPHEN 5-325 MG PO TABS
1.0000 | ORAL_TABLET | Freq: Once | ORAL | Status: AC
  Administered 2020-07-01: 1 via ORAL
  Filled 2020-07-01: qty 1

## 2020-07-01 NOTE — ED Triage Notes (Signed)
Pt comes via [facility], had a fall getting up to use the bathroom, hit head, no LOC, on plavix, c/o of neck pain, PTA received 4mg  zofran.

## 2020-07-01 NOTE — ED Notes (Signed)
Informed pt that we needed a urinalysis. Assisted pt to standing position in order to provide UA and pt could not provide one at this time. Pt very unsteady standing with maximal assist. Pt assisted back into bed at lowest position, bed rails are both secured.

## 2020-07-01 NOTE — ED Notes (Signed)
Patient transported to CT 

## 2020-07-01 NOTE — ED Provider Notes (Addendum)
[facility] EMERGENCY DEPARTMENT Provider Note   CSN: [id number] Arrival date & time: 07/01/20  2014     History Chief Complaint  Patient presents with  . Lind Covert, MD is a 86 y.o. male with past medical history of BPH, CKD, GERD, OSA on CPAP, type 2 diabetes, stroke on Plavix who presents after a fall.  Patient states he was standing up to pull his pants up when he tripped and fell.  He did hit his head, no LOC.  No other blood thinners.  He does complain of headache and neck pain.  He is also complaining of some mild abdominal soreness that feels like "we just worked out a lot."  He said he had a couple episodes of emesis and some continued nausea after the fall, received Zofran with EMS.   Fall This is a new problem. The current episode started less than 1 hour ago. The problem has been resolved. Associated symptoms include headaches. Pertinent negatives include no chest pain, no abdominal pain and no shortness of breath. He has tried nothing for the symptoms.       Past Medical History:  Diagnosis Date  . Anemia   . Basal cell carcinoma 2018   R temple   . Basal cell carcinoma of nose 2002   bridge of nose  . BPH with obstruction/lower urinary tract symptoms    failed flomax, uroxatral, myrbetriq - sees urology [name]) pending videourodynamics  . CKD (chronic kidney disease) stage 3, GFR 30-59 ml/min (HCC) 03/11/2012   Renal US 02/2014 - multiple kidney cysts   . CVA (cerebral infarction) 03/07/12   slurred speech; right hemplegia, left handed - completed PT 07/2014 (17 sessions)  . Depression    mild, on cymbalta per prior PCP  . Erectile dysfunction    per urology (prostaglandin injection)  . GERD (gastroesophageal reflux disease)    with sliding HH  . H/O hiatal hernia   . History of kidney problems 12/2010   lacerated left kidney after fall  . HLD (hyperlipidemia)   . Hx of basal cell carcinoma 07/10/2017   R. lat. forehead  near hair line  . Kidney cysts 02/2014   bilateral (renal US)  . Migraines 03/11/12   now rare  . OSA on CPAP    sleep study 01/2014 - severe, CPAP at 10, previously on nuvigil (Dr. [name])  . Palpitations 01/2012   holter WNL, rare PAC/PVCs  . Presbycusis of both ears 2016   rec amplification [facility])  . Restless leg syndrome    well controlled on mirapex  . Rosacea    [place]  . Stroke ([place])   . TIA (transient ischemic attack)   . Type 2 diabetes, controlled, with neuropathy [facility]) 2011   [name] in [facility]    Patient Active Problem List   Diagnosis Date Noted  . Fever 07/02/2020  . Healthcare maintenance 06/01/2020  . Involuntary movements 04/25/2020  . Post-nasal drainage 07/16/2019  . Recurrent productive cough 05/31/2019  . Hx of basal cell carcinoma 07/10/2017  . Rosacea   . Gait disturbance, post-stroke 10/05/2015  . Presbycusis of both ears   . Advanced care planning/counseling discussion 06/23/2015  . Impaired mobility 04/23/2015  . Hearing loss of right ear due to cerumen impaction 03/23/2015  . HTN (hypertension) 03/18/2015  . Chronic diastolic heart failure, NYHA class 1 ([place]) 03/18/2015  . Spastic hemiparesis affecting dominant side ([place]) 07/07/2014  . Shoulder joint pain 03/16/2014  .  Chest pressure 02/03/2014  . Medicare annual wellness visit, subsequent 11/06/2013  . Erectile dysfunction due to arterial insufficiency 02/05/2013  . MDD (major depressive disorder), recurrent episode, moderate ([place])   . Palpitations 06/26/2012  . Hyperlipidemia associated with type 2 diabetes mellitus ([place])   . Restless leg syndrome   . GERD (gastroesophageal reflux disease)   . BPH with obstruction/lower urinary tract symptoms   . History of CVA (cerebrovascular accident) 03/11/2012  . Hemiparesis affecting right side as late effect of stroke ([place]) 03/11/2012  . Type 2 diabetes mellitus, uncontrolled, with neuropathy ([place]) 03/11/2012  . OSA on CPAP 03/11/2012  . CKD  stage 3 secondary to diabetes ([place]) 03/11/2012  . Migraines 03/11/2012    Past Surgical History:  Procedure Laterality Date  . ABI  2007   WNL  . CARDIAC CATHETERIZATION  2004   normal; [place]  . CARDIOVASCULAR STRESS TEST  02/2014   WNL, low risk scan, EF 68%  . CAROTID ENDARTERECTOMY    . CATARACT EXTRACTION W/ INTRAOCULAR LENS IMPLANT  11//2012 - 08/2011  . COLONOSCOPY  03/2011   poor prep, unable to biopsy polyp in tic fold, rpt 3 mo [name])  . COLONOSCOPY  06/2011   mult polyps adenomatous, severe diverticulosis, rpt 3 yrs [name])  . CYSTOSCOPY WITH URETHRAL DILATATION N/A 07/24/2013   Procedure: CYSTOSCOPY WITH URETHRAL DILATATION;  Surgeon: [name], MD;  Location: [facility] ORS;  Service: Urology;  Laterality: N/A;  . ESOPHAGOGASTRODUODENOSCOPY  2006   duodenitis, medual HH, Grade 2 reflux, mild gastritis  . ESOPHAGOGASTRODUODENOSCOPY  07/2011   mild chronic gastritis  . [place]  . [place]; 2002   left, then repaired  . MRI brain  04/2010   WNL  . PROSTATE SURGERY  02/2009   PVP (laser)  . SKIN CANCER EXCISION  2002   bridge of nose, BCC  . TONSILLECTOMY AND ADENOIDECTOMY  ~ 1945  . TRANSURETHRAL INCISION OF PROSTATE N/A 07/24/2013   Procedure: TRANSURETHRAL INCISION OF THE PROSTATE (TUIP);  Surgeon: [name], MD;  Location: [facility] ORS;  Service: Urology;  Laterality: N/A;  . URETHRAL DILATION  2014   [place]       Family History  Problem Relation Age of Onset  . Stroke Father   . Lung cancer Father 59        smoker  . Stroke Brother   . Diabetes Brother 71  . Lung cancer Brother   . Alcoholism Sister   . Kidney disease Sister   . Coronary artery disease Neg Hx     Social History   Tobacco Use  . Smoking status: Never Smoker  . Smokeless tobacco: Never Used  . Tobacco comment: "used to smoke pipe when I was younger"  Vaping Use  . Vaping Use: Never used  Substance Use Topics  . Alcohol use: Yes     Alcohol/week: 4.0 standard drinks    Types: 2 Glasses of wine, 2 Shots of liquor per week    Comment: occasional  . Drug use: No    Home Medications Prior to Admission medications   Medication Sig Start Date End Date Taking? Authorizing Provider  Alpha-Lipoic Acid 600 MG CAPS Take 1 capsule (600 mg total) by mouth daily. 12/07/19   [name], MD  azelastine (ASTELIN) 0.1 % nasal spray Place 1 spray into both nostrils 2 (two) times daily as needed for rhinitis. Use in each nostril as directed 02/24/19   [name],  MD  BD PEN NEEDLE NANO 2ND GEN 32G X 4 MM MISC  12/29/19   [provider]  capsaicin (ZOSTRIX) 0.025 % cream Apply topically 2 (two) times daily. 01/15/18   [name], MD  celecoxib (CELEBREX) 200 MG capsule Take 1 capsule (200 mg total) by mouth daily. 06/16/20   Copland, Frederico Hamman, MD  clopidogrel (PLAVIX) 75 MG tablet Take 1 tablet (75 mg total) by mouth daily. 01/13/20   [name], MD  Coenzyme Q10 (COQ10) 150 MG CAPS Take 1 capsule by mouth 2 (two) times daily.    [provider]  diazepam (VALIUM) 5 MG tablet Take 1 tablet (5 mg total) by mouth every 8 (eight) hours as needed for muscle spasms. 03/27/17   [name], MD  diclofenac Sodium (VOLTAREN) 1 % GEL Apply 2 g topically 3 (three) times daily as needed. 06/04/20   [name], MD  DULoxetine (CYMBALTA) 60 MG capsule TAKE 1 CAPSULE DAILY 04/05/20   [name], MD  famotidine (PEPCID) 20 MG tablet Take 1 tablet (20 mg total) by mouth at bedtime. 07/16/19   [name], MD  ferrous sulfate 325 (65 FE) MG tablet Take 1 tablet (325 mg total) by mouth every other day. 10/01/19   [name], MD  FOLBEE 2.5-25-1 MG TABS tablet TAKE 1 TABLET DAILY 11/23/16   [name], MD  gabapentin (NEURONTIN) 300 MG capsule Take 2 capsules (600 mg total) by mouth 2 (two) times daily. 06/01/20   [name], MD  guaiFENesin-codeine [facility]) 100-10 MG/5ML  syrup Take 5 mLs by mouth 2 (two) times daily as needed for cough or congestion. 07/11/19   [name], MD  insulin degludec (TRESIBA FLEXTOUCH) 200 UNIT/ML FlexTouch Pen Inject 90 Units into the skin at bedtime. 01/13/20   [name], MD  Insulin Pen Needle (NOVOFINE) 30G X 8 MM MISC Use to inject insulin once daily dx:E11.40 02/26/19   [name], MD  insulin regular (HUMULIN R) 100 units/mL injection Inject 0.06 mLs (6 Units total) into the skin 3 (three) times daily before meals. 01/13/20   [name], MD  levothyroxine (SYNTHROID) 50 MCG tablet Take 1 tablet (50 mcg total) by mouth daily before breakfast. Per endo [name] 12/07/19   [name], MD  LUMIGAN 0.01 % SOLN Place 1 drop into the right eye at bedtime. 06/01/20   [name], MD  metoprolol tartrate (LOPRESSOR) 25 MG tablet TAKE ONE-HALF (1/2) TABLET (12.5 MG) AT BEDTIME WITH MORNING DOSE IF NEEDED 04/13/20   [name], MD  metroNIDAZOLE (METROCREAM) 0.75 % cream Apply 1 application topically 3 (three) times daily.    [provider]  nitroGLYCERIN (NITROSTAT) 0.4 MG SL tablet DISSOLVE 1 TABLET UNDER THE TONGUE EVERY 5 MINUTES AS NEEDED FOR CHEST PAIN 07/28/16   [name], MD  nystatin cream (MYCOSTATIN) Apply 1 application topically 2 (two) times daily. 06/01/20   [name], MD  Omega-3 Fatty Acids (FISH OIL) 1000 MG CAPS Take 1 capsule (1,000 mg total) by mouth in the morning and at bedtime. 06/01/20   [name], MD  ondansetron (ZOFRAN) 4 MG tablet Take 1 tablet (4 mg total) by mouth every 8 (eight) hours as needed for nausea or vomiting. 05/13/20   [name], MD  pramipexole (MIRAPEX) 1 MG tablet Take 1 tablet (1 mg total) by mouth at bedtime. 04/22/20   [name], MD  RABEprazole (ACIPHEX) 20 MG tablet Take 1 tablet (20 mg total) by mouth daily. 04/30/20   [name],  MD  rosuvastatin (CRESTOR) 40 MG tablet Take 1 tablet (40 mg total) by  mouth daily. 04/22/20   [name], MD  Semaglutide,0.25 or 0.5MG /DOS, (OZEMPIC, 0.25 OR 0.5 MG/DOSE,) 2 MG/1.5ML SOPN Inject 0.5 mg into the skin once a week. 06/26/20   [name], MD  traZODone (DESYREL) 50 MG tablet Take 50 mg by mouth at bedtime as needed for sleep.    [provider]  valsartan (DIOVAN) 40 MG tablet TAKE 1 TABLET DAILY 01/05/20   [name], MD  vitamin C (ASCORBIC ACID) 500 MG tablet Take 1,000 mg by mouth daily.     [provider]    Allergies    Gabapentin, Lisinopril, Nexium [esomeprazole], and Niacin and related  Review of Systems   Review of Systems  Constitutional: Negative for chills and fever.  HENT: Negative for ear pain and sore throat.   Eyes: Negative for pain and visual disturbance.  Respiratory: Negative for cough and shortness of breath.   Cardiovascular: Negative for chest pain and palpitations.  Gastrointestinal: Positive for nausea. Negative for abdominal pain.  Genitourinary: Negative for dysuria and hematuria.  Musculoskeletal: Positive for neck pain. Negative for arthralgias and back pain.  Skin: Negative for color change and rash.  Neurological: Positive for headaches. Negative for dizziness, seizures and syncope.  All other systems reviewed and are negative.   Physical Exam Updated Vital Signs BP 119/72   Pulse 84   Temp (!) 100.4 F (38 C) (Oral)   Resp 20   SpO2 97%   Physical Exam Vitals and nursing note reviewed.  Constitutional:      Appearance: He is well-developed.  HENT:     Head: Normocephalic.     Comments: No external signs of trauma on the head.    Mouth/Throat:     Pharynx: Oropharynx is clear.  Eyes:     Conjunctiva/sclera: Conjunctivae normal.     Pupils: Pupils are equal, round, and reactive to light.  Cardiovascular:     Rate and Rhythm: Normal rate and regular rhythm.     Heart sounds: No murmur heard.   Pulmonary:     Effort: Pulmonary effort is normal. No  respiratory distress.     Breath sounds: Normal breath sounds.  Abdominal:     Palpations: Abdomen is soft.     Tenderness: There is abdominal tenderness (mild, epigastric ).  Musculoskeletal:     Cervical back: Neck supple. Tenderness present.     Comments: No T or L-spine tenderness.  Skin:    General: Skin is warm and dry.  Neurological:     Mental Status: He is alert and oriented to person, place, and time.     Cranial Nerves: No cranial nerve deficit.     ED Results / Procedures / Treatments   Labs (all labs ordered are listed, but only abnormal results are displayed) Labs Reviewed  CBC WITH DIFFERENTIAL/PLATELET - Abnormal; Notable for the following components:      Result Value   Platelets 143 (*)    All other components within normal limits  COMPREHENSIVE METABOLIC PANEL - Abnormal; Notable for the following components:   Sodium 134 (*)    Glucose, Bld 139 (*)    Creatinine, Ser 1.38 (*)    Albumin 3.3 (*)    GFR, Estimated 46 (*)    All other components within normal limits  RESPIRATORY PANEL BY RT PCR (FLU A&B, COVID)  CULTURE, BLOOD (ROUTINE X 2)  CULTURE, BLOOD (ROUTINE X 2)  LIPASE, BLOOD  PROTIME-INR  LACTIC ACID, PLASMA  URINALYSIS, ROUTINE W REFLEX MICROSCOPIC  LACTIC ACID, PLASMA  CBC WITH DIFFERENTIAL/PLATELET    EKG None  Radiology CT Head Wo Contrast  Result Date: 07/01/2020 CLINICAL DATA:  86 year old male status post fall striking head. Pain. EXAM: CT HEAD WITHOUT CONTRAST TECHNIQUE: Contiguous axial images were obtained from the base of the skull through the vertex without intravenous contrast. COMPARISON:  Head CT 04/21/2020 and earlier. FINDINGS: Brain: Stable cerebral volume. Stable chronic lacunar infarct of the left corona radiata and lentiform. No midline shift, ventriculomegaly, mass effect, evidence of mass lesion, intracranial hemorrhage or evidence of cortically based acute infarction. Stable gray-white matter differentiation  throughout the brain. Vascular: Calcified atherosclerosis at the skull base. No suspicious intracranial vascular hyperdensity. Skull: Stable.  No fracture identified. Sinuses/Orbits: Visualized paranasal sinuses and mastoids are stable and well pneumatized. Other: No orbit or scalp soft tissue injury identified. IMPRESSION: 1. No acute intracranial abnormality or acute traumatic injury identified. 2. Stable chronic small vessel ischemia in the left hemisphere. Electronically Signed   By: Genevie Ann M.D.   On: 07/01/2020 21:27   CT Cervical Spine Wo Contrast  Result Date: 07/01/2020 CLINICAL DATA:  86 year old male status post fall striking head. Pain. EXAM: CT CERVICAL SPINE WITHOUT CONTRAST TECHNIQUE: Multidetector CT imaging of the cervical spine was performed without intravenous contrast. Multiplanar CT image reconstructions were also generated. COMPARISON:  CT cervical spine 03/11/2012.  Head CT today. FINDINGS: Alignment: Cervical lordosis has not significantly changed since 2013, including mild chronic degenerative anterolisthesis of C5 on C6. Cervicothoracic junction alignment is within normal limits. Bilateral posterior element alignment is within normal limits. Skull base and vertebrae: Visualized skull base is intact. No atlanto-occipital dissociation. No acute osseous abnormality identified. Soft tissues and spinal canal: No prevertebral fluid or swelling. No visible canal hematoma. Negative noncontrast visible neck soft tissues. Disc levels: Widespread right side cervical facet arthropathy. Flowing cervical endplate osteophytes resulting in interbody ankylosis at C6-C7. No significant cervical spinal stenosis suspected. Upper chest: Chronic upper thoracic spine degeneration. Visible upper thoracic levels appear intact. Mild mosaic attenuation in the upper lobes, probably gas trapping. Tortuous proximal great vessels in the visible superior mediastinum. IMPRESSION: 1. No acute traumatic injury  identified in the cervical spine. 2. Cervical spine degeneration not significantly changed since 2013. Gas trapping suspected in the lung apices. Electronically Signed   By: Genevie Ann M.D.   On: 07/01/2020 21:30   CT ABDOMEN PELVIS W CONTRAST  Result Date: 07/01/2020 CLINICAL DATA:  Abdominal pain, fever. Weakness with history of falls. History of appendicitis/appendectomy. EXAM: CT ABDOMEN AND PELVIS WITH CONTRAST TECHNIQUE: Multidetector CT imaging of the abdomen and pelvis was performed using the standard protocol following bolus administration of intravenous contrast. CONTRAST:  18mL OMNIPAQUE IOHEXOL 300 MG/ML  SOLN COMPARISON:  Ultrasound renal 02/20/2014, CT abdomen pelvis 12/01/2010. FINDINGS: Lower chest: Bilateral lower lobe subsegmental atelectasis. At least mild three-vessel coronary artery calcifications. Hepatobiliary: No focal liver abnormality. No gallstones, gallbladder wall thickening, or pericholecystic fluid. No biliary dilatation. Pancreas: Diffusely atrophic. No focal lesion. Otherwise normal pancreatic contour. No surrounding inflammatory changes. No main pancreatic ductal dilatation. Spleen: Normal in size without focal abnormality. Adrenals/Urinary Tract: No adrenal nodule bilaterally. Interval resolution of previous left subcapsular hematoma. Slightly decreased on the right and slightly increased on the left bilateral fluid density lesions likely represent simple renal cysts. Subcentimeter hypodensities are too small to characterize. Bilateral kidneys enhance symmetrically. No hydronephrosis. No hydroureter. The urinary bladder  is unremarkable. Stomach/Bowel: Stomach is within normal limits. No evidence of bowel wall thickening or dilatation. Diffuse colonic diverticulosis. Status post appendectomy. Vascular/Lymphatic: Small I 8 interval increase in size of a peripherally calcified splenic artery aneurysm measuring up to 1.2 cm (from 1 cm). No abdominal aorta or iliac aneurysm. Moderate  severe calcified and noncalcified atherosclerotic plaque of the aorta and its branches. No abdominal, pelvic, or inguinal lymphadenopathy. Reproductive: Prostate is unremarkable. Other: No intraperitoneal free fluid. No intraperitoneal free gas. No organized fluid collection. Musculoskeletal: Surgical changes likely related to a right inguinal hernia repair. No suspicious lytic or blastic osseous lesions. No acute displaced fracture. Multilevel degenerative changes of the spine. IMPRESSION: 1. No acute intra-abdominal or intrapelvic abnormality. 2. Diffuse colonic diverticulosis with no acute diverticulitis. 3. Slightly increased in size 1.2 cm peripherally calcified splenic artery aneurysm. 4.  Aortic Atherosclerosis (ICD10-I70.0). Electronically Signed   By: Iven Finn M.D.   On: 07/01/2020 22:48    Procedures Procedures (including critical care time)  Medications Ordered in ED Medications  enoxaparin (LOVENOX) injection 40 mg (has no administration in time range)  lactated ringers infusion (has no administration in time range)  acetaminophen (TYLENOL) tablet 650 mg (has no administration in time range)    Or  acetaminophen (TYLENOL) suppository 650 mg (has no administration in time range)  ondansetron (ZOFRAN) tablet 4 mg (has no administration in time range)    Or  ondansetron (ZOFRAN) injection 4 mg (has no administration in time range)  insulin aspart (novoLOG) injection 0-15 Units (has no administration in time range)  HYDROcodone-acetaminophen (NORCO/VICODIN) 5-325 MG per tablet 1 tablet (1 tablet Oral Given 07/01/20 2051)  iohexol (OMNIPAQUE) 300 MG/ML solution 80 mL (80 mLs Intravenous Contrast Given 07/01/20 2226)  sodium chloride 0.9 % bolus 1,000 mL (1,000 mLs Intravenous New Bag/Given 07/01/20 2344)    ED Course  I have reviewed the triage vital signs and the nursing notes.  Pertinent labs & imaging results that were available during my care of the patient were reviewed by  me and considered in my medical decision making (see chart for details).    MDM Rules/Calculators/A&P                         Consistent with mechanical fall.  No concern for syncope.  CT head and CT C-spine negative for traumatic injury.  CT abdomen pelvis without injury or acute intra-abdominal pathology.  Do not suspect any traumatic injury of the chest, T-spine, or L-spine.  Patient is febrile and has extreme generalized weakness.  Patient cannot even stand to use the urinal.  Dust x-ray, Covid test, blood cultures, UA pending.  Patient will be admitted to medicine service for further management.  This patient was seen with Dr. Billy Fischer. Final Clinical Impression(s) / ED Diagnoses Final diagnoses:  Fall, initial encounter  Fever, unspecified fever cause    Rx / DC Orders ED Discharge Orders    None       Asencion Noble, MD 07/02/20 [id number]    Asencion Noble, MD 07/02/20 [id number]    Gareth Morgan, MD 07/04/20 [phone]

## 2020-07-02 ENCOUNTER — Emergency Department (HOSPITAL_COMMUNITY): Payer: Medicare Other

## 2020-07-02 DIAGNOSIS — J90 Pleural effusion, not elsewhere classified: Secondary | ICD-10-CM | POA: Diagnosis not present

## 2020-07-02 DIAGNOSIS — E871 Hypo-osmolality and hyponatremia: Secondary | ICD-10-CM

## 2020-07-02 DIAGNOSIS — G8110 Spastic hemiplegia affecting unspecified side: Secondary | ICD-10-CM

## 2020-07-02 DIAGNOSIS — D696 Thrombocytopenia, unspecified: Secondary | ICD-10-CM

## 2020-07-02 DIAGNOSIS — R509 Fever, unspecified: Secondary | ICD-10-CM

## 2020-07-02 DIAGNOSIS — K219 Gastro-esophageal reflux disease without esophagitis: Secondary | ICD-10-CM

## 2020-07-02 DIAGNOSIS — W19XXXD Unspecified fall, subsequent encounter: Secondary | ICD-10-CM

## 2020-07-02 DIAGNOSIS — Z9989 Dependence on other enabling machines and devices: Secondary | ICD-10-CM

## 2020-07-02 DIAGNOSIS — I10 Essential (primary) hypertension: Secondary | ICD-10-CM

## 2020-07-02 DIAGNOSIS — G4733 Obstructive sleep apnea (adult) (pediatric): Secondary | ICD-10-CM

## 2020-07-02 DIAGNOSIS — N1832 Chronic kidney disease, stage 3b: Secondary | ICD-10-CM

## 2020-07-02 DIAGNOSIS — E785 Hyperlipidemia, unspecified: Secondary | ICD-10-CM

## 2020-07-02 DIAGNOSIS — R531 Weakness: Secondary | ICD-10-CM | POA: Diagnosis not present

## 2020-07-02 DIAGNOSIS — E1140 Type 2 diabetes mellitus with diabetic neuropathy, unspecified: Secondary | ICD-10-CM

## 2020-07-02 DIAGNOSIS — E1169 Type 2 diabetes mellitus with other specified complication: Secondary | ICD-10-CM

## 2020-07-02 LAB — CBG MONITORING, ED
Glucose-Capillary: 45 mg/dL — ABNORMAL LOW (ref 70–99)
Glucose-Capillary: 51 mg/dL — ABNORMAL LOW (ref 70–99)
Glucose-Capillary: 75 mg/dL (ref 70–99)
Glucose-Capillary: 145 mg/dL — ABNORMAL HIGH (ref 70–99)
Glucose-Capillary: 61 mg/dL — ABNORMAL LOW (ref 70–99)

## 2020-07-02 LAB — BASIC METABOLIC PANEL
Anion gap: 9 (ref 5–15)
BUN: 17 mg/dL (ref 8–23)
CO2: 25 mmol/L (ref 22–32)
Calcium: 8.5 mg/dL — ABNORMAL LOW (ref 8.9–10.3)
Chloride: 102 mmol/L (ref 98–111)
Creatinine, Ser: 1.45 mg/dL — ABNORMAL HIGH (ref 0.61–1.24)
GFR, Estimated: 43 mL/min — ABNORMAL LOW (ref >60)
Glucose, Bld: 164 mg/dL — ABNORMAL HIGH (ref 70–99)
Potassium: 4.2 mmol/L (ref 3.5–5.1)
Sodium: 136 mmol/L (ref 135–145)

## 2020-07-02 LAB — CBC WITH DIFFERENTIAL/PLATELET
Abs Immature Granulocytes: 0.03 10*3/uL (ref 0.00–0.07)
Basophils Absolute: 0 10*3/uL (ref 0.0–0.1)
Basophils Relative: 0 %
Eosinophils Absolute: 0.1 10*3/uL (ref 0.0–0.5)
Eosinophils Relative: 2 %
HCT: 43.1 % (ref 39.0–52.0)
Hemoglobin: 13.8 g/dL (ref 13.0–17.0)
Immature Granulocytes: 0 %
Lymphocytes Relative: 13 %
Lymphs Abs: 0.9 10*3/uL (ref 0.7–4.0)
MCH: 29.1 pg (ref 26.0–34.0)
MCHC: 32 g/dL (ref 30.0–36.0)
MCV: 90.9 fL (ref 80.0–100.0)
Monocytes Absolute: 0.7 10*3/uL (ref 0.1–1.0)
Monocytes Relative: 11 %
Neutro Abs: 5.1 10*3/uL (ref 1.7–7.7)
Neutrophils Relative %: 74 %
Platelets: 127 10*3/uL — ABNORMAL LOW (ref 150–400)
RBC: 4.74 MIL/uL (ref 4.22–5.81)
RDW: 13.4 % (ref 11.5–15.5)
WBC: 6.9 10*3/uL (ref 4.0–10.5)
nRBC: 0 % (ref 0.0–0.2)

## 2020-07-02 LAB — RESPIRATORY PANEL BY RT PCR (FLU A&B, COVID)
Influenza A by PCR: NEGATIVE
Influenza B by PCR: NEGATIVE
SARS Coronavirus 2 by RT PCR: NEGATIVE

## 2020-07-02 LAB — URINALYSIS, ROUTINE W REFLEX MICROSCOPIC
Bilirubin Urine: NEGATIVE
Glucose, UA: NEGATIVE mg/dL
Hgb urine dipstick: NEGATIVE
Ketones, ur: NEGATIVE mg/dL
Leukocytes,Ua: NEGATIVE
Nitrite: NEGATIVE
Protein, ur: NEGATIVE mg/dL
Specific Gravity, Urine: 1.025 (ref 1.005–1.030)
pH: 5 (ref 5.0–8.0)

## 2020-07-02 LAB — LACTIC ACID, PLASMA: Lactic Acid, Venous: 1.1 mmol/L (ref 0.5–1.9)

## 2020-07-02 LAB — GLUCOSE, CAPILLARY
Glucose-Capillary: 131 mg/dL — ABNORMAL HIGH (ref 70–99)
Glucose-Capillary: 198 mg/dL — ABNORMAL HIGH (ref 70–99)

## 2020-07-02 MED ORDER — INSULIN GLARGINE 100 UNIT/ML ~~LOC~~ SOLN
45.0000 [IU] | Freq: Every day | SUBCUTANEOUS | Status: DC
  Administered 2020-07-02: 45 [IU] via SUBCUTANEOUS
  Filled 2020-07-02 (×2): qty 0.45

## 2020-07-02 MED ORDER — FAMOTIDINE 20 MG PO TABS
20.0000 mg | ORAL_TABLET | Freq: Every day | ORAL | Status: DC
  Administered 2020-07-02 – 2020-07-05 (×4): 20 mg via ORAL
  Filled 2020-07-02 (×4): qty 1

## 2020-07-02 MED ORDER — TRAZODONE HCL 50 MG PO TABS: 50.0000 mg | ORAL_TABLET | Freq: Every evening | ORAL | Status: DC | PRN

## 2020-07-02 MED ORDER — CELECOXIB 200 MG PO CAPS
200.0000 mg | ORAL_CAPSULE | Freq: Every day | ORAL | Status: DC
  Administered 2020-07-03 – 2020-07-06 (×4): 200 mg via ORAL
  Filled 2020-07-02 (×4): qty 1

## 2020-07-02 MED ORDER — NITROGLYCERIN 0.4 MG SL SUBL: 0.4000 mg | SUBLINGUAL_TABLET | SUBLINGUAL | Status: DC | PRN

## 2020-07-02 MED ORDER — DICLOFENAC SODIUM 1 % EX GEL
2.0000 g | Freq: Three times a day (TID) | CUTANEOUS | Status: DC | PRN
  Filled 2020-07-02: qty 100

## 2020-07-02 MED ORDER — FERROUS SULFATE 325 (65 FE) MG PO TABS
325.0000 mg | ORAL_TABLET | ORAL | Status: DC
  Administered 2020-07-03 – 2020-07-05 (×2): 325 mg via ORAL
  Filled 2020-07-02 (×2): qty 1

## 2020-07-02 MED ORDER — ONDANSETRON HCL 4 MG/2ML IJ SOLN
4.0000 mg | Freq: Four times a day (QID) | INTRAMUSCULAR | Status: DC | PRN
  Administered 2020-07-06: 4 mg via INTRAVENOUS
  Filled 2020-07-02: qty 2

## 2020-07-02 MED ORDER — LATANOPROST 0.005 % OP SOLN
1.0000 [drp] | Freq: Every day | OPHTHALMIC | Status: DC
  Administered 2020-07-02 – 2020-07-05 (×4): 1 [drp] via OPHTHALMIC
  Filled 2020-07-02: qty 2.5

## 2020-07-02 MED ORDER — PANTOPRAZOLE SODIUM 40 MG PO TBEC
40.0000 mg | DELAYED_RELEASE_TABLET | Freq: Every day | ORAL | Status: DC
  Administered 2020-07-02 – 2020-07-06 (×5): 40 mg via ORAL
  Filled 2020-07-02 (×5): qty 1

## 2020-07-02 MED ORDER — INSULIN DEGLUDEC 200 UNIT/ML ~~LOC~~ SOPN: 45.0000 [IU] | PEN_INJECTOR | Freq: Every day | SUBCUTANEOUS | Status: DC

## 2020-07-02 MED ORDER — ENOXAPARIN SODIUM 40 MG/0.4ML ~~LOC~~ SOLN
40.0000 mg | Freq: Every day | SUBCUTANEOUS | Status: DC
  Administered 2020-07-02 – 2020-07-05 (×5): 40 mg via SUBCUTANEOUS
  Filled 2020-07-02 (×5): qty 0.4

## 2020-07-02 MED ORDER — ONDANSETRON HCL 4 MG PO TABS: 4.0000 mg | ORAL_TABLET | Freq: Four times a day (QID) | ORAL | Status: DC | PRN

## 2020-07-02 MED ORDER — INSULIN ASPART 100 UNIT/ML ~~LOC~~ SOLN
6.0000 [IU] | Freq: Three times a day (TID) | SUBCUTANEOUS | Status: DC
  Administered 2020-07-03 (×2): 6 [IU] via SUBCUTANEOUS

## 2020-07-02 MED ORDER — GABAPENTIN 300 MG PO CAPS
600.0000 mg | ORAL_CAPSULE | Freq: Every day | ORAL | Status: DC
  Administered 2020-07-02 – 2020-07-05 (×5): 600 mg via ORAL
  Filled 2020-07-02 (×4): qty 2

## 2020-07-02 MED ORDER — LACTATED RINGERS IV SOLN: INTRAVENOUS | Status: AC

## 2020-07-02 MED ORDER — ROSUVASTATIN CALCIUM 20 MG PO TABS
40.0000 mg | ORAL_TABLET | Freq: Every day | ORAL | Status: DC
  Administered 2020-07-03 – 2020-07-06 (×4): 40 mg via ORAL
  Filled 2020-07-02 (×4): qty 2

## 2020-07-02 MED ORDER — INSULIN ASPART 100 UNIT/ML ~~LOC~~ SOLN
0.0000 [IU] | Freq: Three times a day (TID) | SUBCUTANEOUS | Status: DC
  Administered 2020-07-05 – 2020-07-06 (×2): 2 [IU] via SUBCUTANEOUS

## 2020-07-02 MED ORDER — METOPROLOL TARTRATE 12.5 MG HALF TABLET
12.5000 mg | ORAL_TABLET | Freq: Two times a day (BID) | ORAL | Status: DC
  Administered 2020-07-02 – 2020-07-04 (×3): 12.5 mg via ORAL
  Filled 2020-07-02 (×4): qty 1

## 2020-07-02 MED ORDER — FOLIC ACID-VIT B6-VIT B12 2.5-25-1 MG PO TABS: 1.0000 | ORAL_TABLET | Freq: Every day | ORAL | Status: DC

## 2020-07-02 MED ORDER — GABAPENTIN 300 MG PO CAPS
600.0000 mg | ORAL_CAPSULE | Freq: Every day | ORAL | Status: DC
  Administered 2020-07-03 – 2020-07-04 (×2): 600 mg via ORAL
  Filled 2020-07-02 (×2): qty 2

## 2020-07-02 MED ORDER — PRAMIPEXOLE DIHYDROCHLORIDE 1 MG PO TABS
1.0000 mg | ORAL_TABLET | Freq: Every day | ORAL | Status: DC
  Administered 2020-07-02 – 2020-07-03 (×2): 1 mg via ORAL
  Filled 2020-07-02 (×2): qty 8
  Filled 2020-07-02 (×2): qty 1

## 2020-07-02 MED ORDER — LEVOTHYROXINE SODIUM 50 MCG PO TABS
50.0000 ug | ORAL_TABLET | Freq: Every day | ORAL | Status: DC
  Administered 2020-07-03 – 2020-07-06 (×4): 50 ug via ORAL
  Filled 2020-07-02 (×4): qty 1

## 2020-07-02 MED ORDER — OXYCODONE HCL 5 MG PO TABS
5.0000 mg | ORAL_TABLET | Freq: Two times a day (BID) | ORAL | Status: DC | PRN
  Administered 2020-07-02 – 2020-07-05 (×5): 5 mg via ORAL
  Filled 2020-07-02 (×5): qty 1

## 2020-07-02 MED ORDER — L-METHYLFOLATE-B6-B12 3-35-2 MG PO TABS
1.0000 | ORAL_TABLET | Freq: Every day | ORAL | Status: DC
  Administered 2020-07-03 – 2020-07-04 (×2): 1 via ORAL
  Filled 2020-07-02 (×2): qty 1

## 2020-07-02 MED ORDER — GABAPENTIN 300 MG PO CAPS: 600.0000 mg | ORAL_CAPSULE | Freq: Two times a day (BID) | ORAL | Status: DC

## 2020-07-02 MED ORDER — CLOPIDOGREL BISULFATE 75 MG PO TABS
75.0000 mg | ORAL_TABLET | Freq: Every day | ORAL | Status: DC
  Administered 2020-07-02 – 2020-07-04 (×3): 75 mg via ORAL
  Filled 2020-07-02 (×3): qty 1

## 2020-07-02 MED ORDER — ACETAMINOPHEN 325 MG PO TABS
650.0000 mg | ORAL_TABLET | Freq: Four times a day (QID) | ORAL | Status: DC | PRN
  Administered 2020-07-02: 650 mg via ORAL
  Filled 2020-07-02: qty 2

## 2020-07-02 MED ORDER — ASCORBIC ACID 500 MG PO TABS
1000.0000 mg | ORAL_TABLET | Freq: Every day | ORAL | Status: DC
  Administered 2020-07-02 – 2020-07-04 (×3): 1000 mg via ORAL
  Filled 2020-07-02 (×3): qty 2

## 2020-07-02 MED ORDER — DULOXETINE HCL 60 MG PO CPEP
60.0000 mg | ORAL_CAPSULE | Freq: Every day | ORAL | Status: DC
  Administered 2020-07-03 – 2020-07-06 (×3): 60 mg via ORAL
  Filled 2020-07-02 (×5): qty 1

## 2020-07-02 MED ORDER — ACETAMINOPHEN 650 MG RE SUPP: 650.0000 mg | Freq: Four times a day (QID) | RECTAL | Status: DC | PRN

## 2020-07-02 MED ORDER — GABAPENTIN 300 MG PO CAPS
600.0000 mg | ORAL_CAPSULE | Freq: Two times a day (BID) | ORAL | Status: DC
  Administered 2020-07-02: 600 mg via ORAL
  Filled 2020-07-02: qty 2

## 2020-07-02 MED ORDER — ALPHA-LIPOIC ACID 600 MG PO CAPS: 1.0000 | ORAL_CAPSULE | Freq: Every day | ORAL | Status: DC

## 2020-07-02 MED ORDER — IRBESARTAN 75 MG PO TABS
75.0000 mg | ORAL_TABLET | Freq: Every day | ORAL | Status: DC
  Administered 2020-07-03: 75 mg via ORAL
  Filled 2020-07-02: qty 1

## 2020-07-02 NOTE — Progress Notes (Signed)
RT went to check on pt to see if he is ready to go on the CPAP. Pt refused tonight.

## 2020-07-02 NOTE — Progress Notes (Signed)
PHARMACIST - PHYSICIAN ORDER COMMUNICATION  CONCERNING: P&T Medication Policy on Herbal Medications  DESCRIPTION:  This patient's order for: Alpha lipoic acid has been noted.  This product(s) is classified as an "herbal" or natural product. Due to a lack of definitive safety studies or FDA approval, nonstandard manufacturing practices, plus the potential risk of unknown drug-drug interactions while on inpatient medications, the Pharmacy and Therapeutics Committee does not permit the use of "herbal" or natural products of this type within [place].   ACTION TAKEN: The pharmacy department is unable to verify this order at this time and your patient has been informed of this safety policy. Please reevaluate patient's clinical condition at discharge and address if the herbal or natural product(s) should be resumed at that time.  

## 2020-07-02 NOTE — ED Notes (Signed)
Breakfast tray set up for pt at this time.

## 2020-07-02 NOTE — Evaluation (Signed)
Physical Therapy Evaluation Patient Details Name: James CHERNE, MD MRN: [id number] DOB: [birth year] Today's Date: 07/02/2020   History of Present Illness  Pt is 86 y.o. male with PMH including of anemia, BPH, CKD stage III, history of CVA with right-sided hemiparesis, depression, rectal dysfunction, GERD, hiatal hernia,hyperlipidemia,, OSA on CPAP, restless leg syndrome, and DM2.  Pt presented to the ED after a fall.  CT head/cervical spine/abd/pelvis no acute abnormality. Pt found to have a fever. Pt received Musician Vaccine 2 days ago.  Clinical Impression   Pt admitted with above diagnosis. Pt has hx of R hemiparesis but is typically independent with AD.  He does live alone in retirement community.  Pt now presents with decreased mobiltiy, strength, balance, endurance, ROM, and safety.  He demonstrates decreased R LE strength compared to his baseline and now with buckling and required knee blocked to take a couple steps.  Pt not safe to return home alone from PT perspective - recommend SNF. Pt currently with functional limitations due to the deficits listed below (see PT Problem List). Pt will benefit from skilled PT to increase their independence and safety with mobility to allow discharge to the venue listed below.       Follow Up Recommendations SNF    Equipment Recommendations  None recommended by PT    Recommendations for Other Services OT consult     Precautions / Restrictions Precautions Precautions: Fall      Mobility  Bed Mobility Overal bed mobility: Needs Assistance Bed Mobility: Supine to Sit;Sit to Supine     Supine to sit: Mod assist Sit to supine: Min assist   General bed mobility comments: Mod A to lift trunk to sit; min A for R LE back to bed  Transfers Overall transfer level: Needs assistance Equipment used: 1 person hand held assist Transfers: Sit to/from Stand Sit to Stand: Min assist;Mod assist         General transfer comment:  Sit to stand x 3 with min-mod A to power up.  Provided assistance with belt , pt holding therapist arm and therapist blocking R knee.  Ambulation/Gait Ambulation/Gait assistance: Mod assist Gait Distance (Feet): 2 Feet (2'x2) Assistive device: 1 person hand held assist Gait Pattern/deviations: Step-to pattern;Shuffle;Decreased weight shift to right Gait velocity: decreased   General Gait Details: Applied AFO prior to standing. Took lateral steps at EOB with pt holding therapist arms and therapist blocking R knee during stance to prevent buckling.  Needed rest breaks , fatigue easily, reports has never had issues with buckling before  Stairs            Wheelchair Mobility    Modified Rankin (Stroke Patients Only)       Balance Overall balance assessment: Needs assistance Sitting-balance support: Feet supported;Bilateral upper extremity supported;Single extremity supported Sitting balance-Leahy Scale: Poor Sitting balance - Comments: Pt requiring single or bil UE support for balance, only able to lift UE for short time   Standing balance support: Bilateral upper extremity supported Standing balance-Leahy Scale: Poor Standing balance comment: Required min A for static standing with UE support; cued for posture; performed 3 bouts of standing with weight shifting with R knee buckled                             Pertinent Vitals/Pain Pain Assessment: 0-10 Pain Score: 4  Pain Location: sore abdominal muscles Pain Descriptors / Indicators: Sore Pain Intervention(s): Limited activity within  patient's tolerance;Monitored during session    Home Living Family/patient expects to be discharged to:: Private residence Living Arrangements: Alone Available Help at Discharge: Family;Available PRN/intermittently Type of Home: Independent living facility (retirement community) Home Access: Level entry     Home Layout: One level Home Equipment: Environmental consultant - 4 wheels;Cane -  quad;Grab bars - tub/shower;Shower seat      Prior Function Level of Independence: Independent with assistive device(s)         Comments: Pt reports completely independent with cane or walker.  Can ambulate in community, drive, do ADLs and IADLs     Hand Dominance        Extremity/Trunk Assessment   Upper Extremity Assessment Upper Extremity Assessment: RUE deficits/detail;LUE deficits/detail RUE Deficits / Details: Chronic deficits due to CVA.  Shoulder abduction to ~45 degrees, elbow/hand WFL but does have increased flexor tone.  Strength 3/5 LUE Deficits / Details: ROM WFL; MMT 4/5    Lower Extremity Assessment Lower Extremity Assessment: LLE deficits/detail;RLE deficits/detail RLE Deficits / Details: ROM WFL; MMT: Chronic deficits due to CVA but these are worsened at this time. Ankle DF 3/5 (pt does wear AFO), Knee ext 3/5 - significantly weaker than his baseline, hip 3/5 (weaker than baseline) LLE Deficits / Details: ROM WFL: MMT 4+/5    Cervical / Trunk Assessment Cervical / Trunk Assessment: Normal  Communication   Communication: Expressive difficulties (slurred speech hx of CVA)  Cognition Arousal/Alertness: Awake/alert Behavior During Therapy: WFL for tasks assessed/performed Overall Cognitive Status: Within Functional Limits for tasks assessed                                        General Comments General comments (skin integrity, edema, etc.): VSS    Exercises     Assessment/Plan    PT Assessment Patient needs continued PT services  PT Problem List Decreased strength;Decreased mobility;Decreased range of motion;Decreased coordination;Decreased activity tolerance;Decreased balance;Decreased knowledge of use of DME       PT Treatment Interventions DME instruction;Therapeutic exercise;Gait training;Balance training;Functional mobility training;Therapeutic activities;Patient/family education;Neuromuscular re-education    PT Goals (Current  goals can be found in the Care Plan section)  Acute Rehab PT Goals Patient Stated Goal: return to independence PT Goal Formulation: With patient Time For Goal Achievement: 07/16/20 Potential to Achieve Goals: Good    Frequency Min 3X/week   Barriers to discharge Decreased caregiver support      Co-evaluation               AM-PAC PT "6 Clicks" Mobility  Outcome Measure Help needed turning from your back to your side while in a flat bed without using bedrails?: A Little Help needed moving from lying on your back to sitting on the side of a flat bed without using bedrails?: A Lot Help needed moving to and from a bed to a chair (including a wheelchair)?: A Lot Help needed standing up from a chair using your arms (e.g., wheelchair or bedside chair)?: A Lot Help needed to walk in hospital room?: A Lot Help needed climbing 3-5 steps with a railing? : Total 6 Click Score: 12    End of Session Equipment Utilized During Treatment: Gait belt Activity Tolerance: Patient tolerated treatment well Patient left: in bed;with call bell/phone within reach (in ED on stretcher) Nurse Communication: Mobility status PT Visit Diagnosis: Other abnormalities of gait and mobility (R26.89);Muscle weakness (generalized) (M62.81);History of falling (Z91.81)  Time: 6161-2240 PT Time Calculation (min) (ACUTE ONLY): 30 min   Charges:   PT Evaluation $PT Eval Moderate Complexity: 1 Mod PT Treatments $Therapeutic Activity: 8-22 mins        Abran Richard, PT Acute Rehab Services Pager [phone] [facility] [phone]    Karlton Lemon 07/02/2020, 1:23 PM

## 2020-07-02 NOTE — Progress Notes (Signed)
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 He presents to [facility] on 06/30/2020 following a fall. He had received a Psychologist, prison and probation services vaccine on 06/29/2020. He denies rhinorrhea, sore throat, productive cough, nausea, emesis, diarrhea, flank pain or dysuria. However he did admit to feeling weaker earlier in the day before his fall.   He carries a past medical history significant for  anemia, BPH, CKD stage III, history of CVA with right-sided hemiparesis, depression, rectal dysfunction, GERD, hiatal hernia,hyperlipidemia,, OSA on CPAP, restless leg syndrome, and DM2.   In the ED he was found to have a fever of 100.4. The remainder of his vitals were within normal limits. CBC was likewise normal with the exception of a slightly low sodium (134) and an elevated creatinine.  [facility] have been consulted to admit the patient for further evaluation and treatment. This patient was admitted by my colleague Dr. Olevia Bowens earlier this morning.  The patient is resting comfortably as I enter the room. He states that he cannot think of a possible infective source of his fever. He is awake, alert, and oriented x 3. Heart and lung exam are within normal limits. Abdomen is soft, non-tender, non-distended. Bowel sounds are within normal limits. Extremities are without cyanosis, clubbing, or edema.  PT/OT has been asked to evaluate and treat the patient and to make recommendations for disposition.

## 2020-07-02 NOTE — ED Notes (Signed)
Assisted patient to and from [facility], required 2 staff members. Patient did not void. Patient assisted back in bed.

## 2020-07-02 NOTE — H&P (Signed)
History and Physical    James Spurr, MD TDV:[id number] DOB: 1931/02/11 DOA: 07/01/2020  PCP: [name], MD  Patient coming from: Home.  I have personally briefly reviewed patient's old medical records in [place]  Chief Complaint: Fall.  HPI: James Spurr, MD is a 86 y.o. male with medical history significant of anemia, right temple and nasal bridge basal cell carcinoma, BPH, CKD stage III, history of CVA with right-sided hemiparesis, depression, rectal dysfunction, GERD, hiatal hernia, would lithiasis, hyperlipidemia, renal cyst, rare migraines, OSA on CPAP, history of palpitations, restless leg syndrome, rosacea, type 2 diabetes who is coming to the emergency department after having a fall while trying to get up to use the bathroom hitting his head, but without having any loss of consciousness.  The patient was febrile on arrival to the emergency department, but he denies rhinorrhea, sore throat, productive cough, nausea, emesis, diarrhea, flank pain or dysuria.  However, he had his Pfizer mRNA vaccine third dose 2 days ago.  His appetite has been good, but he has been feeling weaker since earlier in the day.  He denies chest pain, palpitations, dizziness, diaphoresis, or recent lower extremity edema.  No abdominal pain, constipation, melena hematochezia.  Denies polyuria, polyuria, polydipsia, polyphagia or blurred vision.  ED Course: Initial vital signs were temperature 100.4 F, pulse 84, respirations 20, blood pressure 119/72 mmHg O2 sat 97% on room air.  The patient received a 1000 mL NS bolus and a tablet of Norco 5-325 mg.  His urinalysis was unremarkable.  CBC showed a white count 7.8, hemoglobin 13.5 g/dL and platelets 143.  PT and INR were normal.  Lipase and lactic acid within normal limits.  SARS 2 and influenza PCR were negative.  CMP showed a sodium of 134 mmol/L, but all other values are within normal limits.  Glucose 139 and creatinine 1.38 mg/dL.   LFTs show an albumin of 3.3 g/dL, but all other values are within expected range.  Imaging: 1 view chest radiograph did not show any acute on normalities.  CT head/cervical spine/abdomen/pelvis did not show any acute normality.  Please see images and full regular report for further detail.  Review of Systems: As per HPI otherwise all other systems reviewed and are negative.  Past Medical History:  Diagnosis Date  . Anemia   . Basal cell carcinoma 2018   R temple   . Basal cell carcinoma of nose 2002   bridge of nose  . BPH with obstruction/lower urinary tract symptoms    failed flomax, uroxatral, myrbetriq - sees urology [name]) pending videourodynamics  . CKD (chronic kidney disease) stage 3, GFR 30-59 ml/min (HCC) 03/11/2012   Renal US 02/2014 - multiple kidney cysts   . CVA (cerebral infarction) 03/07/12   slurred speech; right hemplegia, left handed - completed PT 07/2014 (17 sessions)  . Depression    mild, on cymbalta per prior PCP  . Erectile dysfunction    per urology (prostaglandin injection)  . GERD (gastroesophageal reflux disease)    with sliding HH  . H/O hiatal hernia   . History of kidney problems 12/2010   lacerated left kidney after fall  . HLD (hyperlipidemia)   . Hx of basal cell carcinoma 07/10/2017   R. lat. forehead near hair line  . Kidney cysts 02/2014   bilateral (renal US)  . Migraines 03/11/12   now rare  . OSA on CPAP    sleep study 01/2014 - severe, CPAP at 10, previously on nuvigil (  Dr. [name])  . Palpitations 01/2012   holter WNL, rare PAC/PVCs  . Presbycusis of both ears 2016   rec amplification [facility])  . Restless leg syndrome    well controlled on mirapex  . Rosacea    [place]  . Stroke ([place])   . TIA (transient ischemic attack)   . Type 2 diabetes, controlled, with neuropathy [facility]) 2011   [name] in [facility]    Past Surgical History:  Procedure Laterality Date  . ABI  2007   WNL  . CARDIAC CATHETERIZATION  2004   normal; [place]  . CARDIOVASCULAR STRESS TEST  02/2014   WNL, low risk scan, EF 68%  . CAROTID ENDARTERECTOMY    . CATARACT EXTRACTION W/ INTRAOCULAR LENS IMPLANT  11//2012 - 08/2011  . COLONOSCOPY  03/2011   poor prep, unable to biopsy polyp in tic fold, rpt 3 mo [name])  . COLONOSCOPY  06/2011   mult polyps adenomatous, severe diverticulosis, rpt 3 yrs [name])  . CYSTOSCOPY WITH URETHRAL DILATATION N/A 07/24/2013   Procedure: CYSTOSCOPY WITH URETHRAL DILATATION;  Surgeon: [name], MD;  Location: [facility]S;  Service: Urology;  Laterality: N/A;  . ESOPHAGOGASTRODUODENOSCOPY  2006   duodenitis, medual HH, Grade 2 reflux, mild gastritis  . ESOPHAGOGASTRODUODENOSCOPY  07/2011   mild chronic gastritis  . [place]  . [place]; 2002   left, then repaired  . MRI brain  04/2010   WNL  . PROSTATE SURGERY  02/2009   PVP (laser)  . SKIN CANCER EXCISION  2002   bridge of nose, BCC  . TONSILLECTOMY AND ADENOIDECTOMY  ~ 1945  . TRANSURETHRAL INCISION OF PROSTATE N/A 07/24/2013   Procedure: TRANSURETHRAL INCISION OF THE PROSTATE (TUIP);  Surgeon: [name], MD;  Location: [facility];  Service: Urology;  Laterality: N/A;  . URETHRAL DILATION  2014   t[place]    Social History  reports that he has never smoked. He has never used smokeless tobacco. He reports current alcohol use of about 4.0 standard drinks of alcohol per week. He reports that he does not use drugs.  Allergies  Allergen Reactions  . Gabapentin Other (See Comments)    At high doses - BLURRED VISION, BALLANCE, CONCENTRATION PROBLEMS, RESTLESSNESS, NOT SLEEPING, ? LOST OF STRENGTH, UNSTEADINESS  . Lisinopril Cough    tachycardia  . Nexium [Esomeprazole] Other (See Comments)    Headache  . Niacin And Related Other (See Comments)    flushing    Family History  Problem Relation Age of Onset  . Stroke Father   . Lung cancer Father 51        smoker  . Stroke Brother   . Diabetes  Brother 46  . Lung cancer Brother   . Alcoholism Sister   . Kidney disease Sister   . Coronary artery disease Neg Hx    Prior to Admission medications   Medication Sig Start Date End Date Taking? Authorizing Provider  Alpha-Lipoic Acid 600 MG CAPS Take 1 capsule (600 mg total) by mouth daily. 12/07/19   [name], MD  azelastine (ASTELIN) 0.1 % nasal spray Place 1 spray into both nostrils 2 (two) times daily as needed for rhinitis. Use in each nostril as directed 02/24/19   [name], MD  BD PEN NEEDLE NANO 2ND GEN 32G X 4 MM MISC  12/29/19   [provider]  capsaicin (ZOSTRIX) 0.025 % cream Apply topically 2 (two) times daily. 01/15/18  [name], MD  celecoxib (CELEBREX) 200 MG capsule Take 1 capsule (200 mg total) by mouth daily. 06/16/20   Copland, Frederico Hamman, MD  clopidogrel (PLAVIX) 75 MG tablet Take 1 tablet (75 mg total) by mouth daily. 01/13/20   [name], MD  Coenzyme Q10 (COQ10) 150 MG CAPS Take 1 capsule by mouth 2 (two) times daily.    [provider]  diazepam (VALIUM) 5 MG tablet Take 1 tablet (5 mg total) by mouth every 8 (eight) hours as needed for muscle spasms. 03/27/17   [name], MD  diclofenac Sodium (VOLTAREN) 1 % GEL Apply 2 g topically 3 (three) times daily as needed. 06/04/20   [name], MD  DULoxetine (CYMBALTA) 60 MG capsule TAKE 1 CAPSULE DAILY 04/05/20   [name], MD  famotidine (PEPCID) 20 MG tablet Take 1 tablet (20 mg total) by mouth at bedtime. 07/16/19   [name], MD  ferrous sulfate 325 (65 FE) MG tablet Take 1 tablet (325 mg total) by mouth every other day. 10/01/19   [name], MD  FOLBEE 2.5-25-1 MG TABS tablet TAKE 1 TABLET DAILY 11/23/16   [name], MD  gabapentin (NEURONTIN) 300 MG capsule Take 2 capsules (600 mg total) by mouth 2 (two) times daily. 06/01/20   [name], MD  guaiFENesin-codeine [facility]) 100-10 MG/5ML syrup Take 5 mLs by mouth 2 (two)  times daily as needed for cough or congestion. 07/11/19   [name], MD  insulin degludec (TRESIBA FLEXTOUCH) 200 UNIT/ML FlexTouch Pen Inject 90 Units into the skin at bedtime. 01/13/20   [name], MD  Insulin Pen Needle (NOVOFINE) 30G X 8 MM MISC Use to inject insulin once daily dx:E11.40 02/26/19   [name], MD  insulin regular (HUMULIN R) 100 units/mL injection Inject 0.06 mLs (6 Units total) into the skin 3 (three) times daily before meals. 01/13/20   [name], MD  levothyroxine (SYNTHROID) 50 MCG tablet Take 1 tablet (50 mcg total) by mouth daily before breakfast. Per endo [name] 12/07/19   [name], MD  LUMIGAN 0.01 % SOLN Place 1 drop into the right eye at bedtime. 06/01/20   [name], MD  metoprolol tartrate (LOPRESSOR) 25 MG tablet TAKE ONE-HALF (1/2) TABLET (12.5 MG) AT BEDTIME WITH MORNING DOSE IF NEEDED 04/13/20   [name], MD  metroNIDAZOLE (METROCREAM) 0.75 % cream Apply 1 application topically 3 (three) times daily.    [provider]  nitroGLYCERIN (NITROSTAT) 0.4 MG SL tablet DISSOLVE 1 TABLET UNDER THE TONGUE EVERY 5 MINUTES AS NEEDED FOR CHEST PAIN 07/28/16   [name], MD  nystatin cream (MYCOSTATIN) Apply 1 application topically 2 (two) times daily. 06/01/20   [name], MD  Omega-3 Fatty Acids (FISH OIL) 1000 MG CAPS Take 1 capsule (1,000 mg total) by mouth in the morning and at bedtime. 06/01/20   [name], MD  ondansetron (ZOFRAN) 4 MG tablet Take 1 tablet (4 mg total) by mouth every 8 (eight) hours as needed for nausea or vomiting. 05/13/20   [name], MD  pramipexole (MIRAPEX) 1 MG tablet Take 1 tablet (1 mg total) by mouth at bedtime. 04/22/20   [name], MD  RABEprazole (ACIPHEX) 20 MG tablet Take 1 tablet (20 mg total) by mouth daily. 04/30/20   [name], MD  rosuvastatin (CRESTOR) 40 MG tablet Take 1 tablet (40 mg total) by mouth daily. 04/22/20   [name], MD  Semaglutide,0.25 or 0.5MG /DOS, (OZEMPIC, 0.25 OR 0.5 MG/DOSE,) 2 MG/1.5ML SOPN  Inject 0.5 mg into the skin once a week. 06/26/20   [name], MD  traZODone (DESYREL) 50 MG tablet Take 50 mg by mouth at bedtime as needed for sleep.    [provider]  valsartan (DIOVAN) 40 MG tablet TAKE 1 TABLET DAILY 01/05/20   [name], MD  vitamin C (ASCORBIC ACID) 500 MG tablet Take 1,000 mg by mouth daily.     [provider]    Physical Exam: Vitals:   07/01/20 2017 07/02/20 0100  BP: 119/72 (!) 120/52  Pulse: 84 80  Resp: 20 16  Temp: (!) 100.4 F (38 C) 97.9 F (36.6 C)  TempSrc: Oral Oral  SpO2: 97% 99%    Constitutional: NAD, calm, comfortable Eyes: PERRL, lids and conjunctivae mildly injected. ENMT: Mucous membranes are moist. Posterior pharynx clear of any exudate or lesions.Normal dentition.  Neck: normal, supple, no masses, no thyromegaly Respiratory: clear to auscultation bilaterally, no wheezing, no crackles. Normal respiratory effort. No accessory muscle use.  Cardiovascular: Regular rate and rhythm, no murmurs / rubs / gallops. No extremity edema. 2+ pedal pulses. No carotid bruits.  Abdomen: Nondistended.  BS positive.  Soft, no tenderness, no masses palpated. No hepatosplenomegaly. Musculoskeletal: no clubbing / cyanosis. No joint deformity upper and lower extremities. Good ROM, no contractures.  Increased right-sided muscle tone.  Skin: Positive facial erythema. Neurologic: CN 2-12 grossly intact. Sensation intact, DTR normal.  Spastic hemiparesis on the right side. Psychiatric: Normal judgment and insight. Alert and oriented x 3. Normal mood.   Labs on Admission: I have personally reviewed following labs and imaging studies  CBC: Recent Labs  Lab 07/01/20 2036  WBC 7.8  NEUTROABS 6.2  HGB 13.5  HCT 42.0  MCV 89.9  PLT 143*    Basic Metabolic Panel: Recent Labs  Lab 07/01/20 2036  NA 134*  K 4.3  CL 100  CO2 25   GLUCOSE 139*  BUN 20  CREATININE 1.38*  CALCIUM 8.9    GFR: CrCl cannot be calculated (Unknown ideal weight.).  Liver Function Tests: Recent Labs  Lab 07/01/20 2036  AST 24  ALT 18  ALKPHOS 40  BILITOT 0.7  PROT 6.6  ALBUMIN 3.3*    Urine analysis:    Component Value Date/Time   COLORURINE STRAW (A) 07/02/2020 0138   APPEARANCEUR CLEAR 07/02/2020 0138   LABSPEC 1.025 07/02/2020 0138   PHURINE 5.0 07/02/2020 0138   GLUCOSEU NEGATIVE 07/02/2020 0138   HGBUR NEGATIVE 07/02/2020 0138   BILIRUBINUR NEGATIVE 07/02/2020 0138   BILIRUBINUR Negative 07/05/2012 1139   KETONESUR NEGATIVE 07/02/2020 0138   PROTEINUR NEGATIVE 07/02/2020 0138   UROBILINOGEN 0.2 03/18/2015 1102   NITRITE NEGATIVE 07/02/2020 0138   LEUKOCYTESUR NEGATIVE 07/02/2020 0138    Radiological Exams on Admission: CT Head Wo Contrast  Result Date: 07/01/2020 CLINICAL DATA:  86 year old male status post fall striking head. Pain. EXAM: CT HEAD WITHOUT CONTRAST TECHNIQUE: Contiguous axial images were obtained from the base of the skull through the vertex without intravenous contrast. COMPARISON:  Head CT 04/21/2020 and earlier. FINDINGS: Brain: Stable cerebral volume. Stable chronic lacunar infarct of the left corona radiata and lentiform. No midline shift, ventriculomegaly, mass effect, evidence of mass lesion, intracranial hemorrhage or evidence of cortically based acute infarction. Stable gray-white matter differentiation throughout the brain. Vascular: Calcified atherosclerosis at the skull base. No suspicious intracranial vascular hyperdensity. Skull: Stable.  No fracture identified. Sinuses/Orbits: Visualized paranasal sinuses and mastoids are stable and well pneumatized. Other: No orbit or scalp soft  tissue injury identified. IMPRESSION: 1. No acute intracranial abnormality or acute traumatic injury identified. 2. Stable chronic small vessel ischemia in the left hemisphere. Electronically Signed   By: Genevie Ann  M.D.   On: 07/01/2020 21:27   CT Cervical Spine Wo Contrast  Result Date: 07/01/2020 CLINICAL DATA:  86 year old male status post fall striking head. Pain. EXAM: CT CERVICAL SPINE WITHOUT CONTRAST TECHNIQUE: Multidetector CT imaging of the cervical spine was performed without intravenous contrast. Multiplanar CT image reconstructions were also generated. COMPARISON:  CT cervical spine 03/11/2012.  Head CT today. FINDINGS: Alignment: Cervical lordosis has not significantly changed since 2013, including mild chronic degenerative anterolisthesis of C5 on C6. Cervicothoracic junction alignment is within normal limits. Bilateral posterior element alignment is within normal limits. Skull base and vertebrae: Visualized skull base is intact. No atlanto-occipital dissociation. No acute osseous abnormality identified. Soft tissues and spinal canal: No prevertebral fluid or swelling. No visible canal hematoma. Negative noncontrast visible neck soft tissues. Disc levels: Widespread right side cervical facet arthropathy. Flowing cervical endplate osteophytes resulting in interbody ankylosis at C6-C7. No significant cervical spinal stenosis suspected. Upper chest: Chronic upper thoracic spine degeneration. Visible upper thoracic levels appear intact. Mild mosaic attenuation in the upper lobes, probably gas trapping. Tortuous proximal great vessels in the visible superior mediastinum. IMPRESSION: 1. No acute traumatic injury identified in the cervical spine. 2. Cervical spine degeneration not significantly changed since 2013. Gas trapping suspected in the lung apices. Electronically Signed   By: Genevie Ann M.D.   On: 07/01/2020 21:30   CT ABDOMEN PELVIS W CONTRAST  Result Date: 07/01/2020 CLINICAL DATA:  Abdominal pain, fever. Weakness with history of falls. History of appendicitis/appendectomy. EXAM: CT ABDOMEN AND PELVIS WITH CONTRAST TECHNIQUE: Multidetector CT imaging of the abdomen and pelvis was performed using the  standard protocol following bolus administration of intravenous contrast. CONTRAST:  67mL OMNIPAQUE IOHEXOL 300 MG/ML  SOLN COMPARISON:  Ultrasound renal 02/20/2014, CT abdomen pelvis 12/01/2010. FINDINGS: Lower chest: Bilateral lower lobe subsegmental atelectasis. At least mild three-vessel coronary artery calcifications. Hepatobiliary: No focal liver abnormality. No gallstones, gallbladder wall thickening, or pericholecystic fluid. No biliary dilatation. Pancreas: Diffusely atrophic. No focal lesion. Otherwise normal pancreatic contour. No surrounding inflammatory changes. No main pancreatic ductal dilatation. Spleen: Normal in size without focal abnormality. Adrenals/Urinary Tract: No adrenal nodule bilaterally. Interval resolution of previous left subcapsular hematoma. Slightly decreased on the right and slightly increased on the left bilateral fluid density lesions likely represent simple renal cysts. Subcentimeter hypodensities are too small to characterize. Bilateral kidneys enhance symmetrically. No hydronephrosis. No hydroureter. The urinary bladder is unremarkable. Stomach/Bowel: Stomach is within normal limits. No evidence of bowel wall thickening or dilatation. Diffuse colonic diverticulosis. Status post appendectomy. Vascular/Lymphatic: Small I 8 interval increase in size of a peripherally calcified splenic artery aneurysm measuring up to 1.2 cm (from 1 cm). No abdominal aorta or iliac aneurysm. Moderate severe calcified and noncalcified atherosclerotic plaque of the aorta and its branches. No abdominal, pelvic, or inguinal lymphadenopathy. Reproductive: Prostate is unremarkable. Other: No intraperitoneal free fluid. No intraperitoneal free gas. No organized fluid collection. Musculoskeletal: Surgical changes likely related to a right inguinal hernia repair. No suspicious lytic or blastic osseous lesions. No acute displaced fracture. Multilevel degenerative changes of the spine. IMPRESSION: 1. No acute  intra-abdominal or intrapelvic abnormality. 2. Diffuse colonic diverticulosis with no acute diverticulitis. 3. Slightly increased in size 1.2 cm peripherally calcified splenic artery aneurysm. 4.  Aortic Atherosclerosis (ICD10-I70.0). Electronically Signed   By: Thomasena Edis  Mckinley Jewel M.D.   On: 07/01/2020 22:48   DG Chest Portable 1 View  Result Date: 07/02/2020 CLINICAL DATA:  Fever, generalized weakness EXAM: PORTABLE CHEST 1 VIEW COMPARISON:  06/21/2019 FINDINGS: Lungs volumes are small, but are symmetric and are clear. No pneumothorax or pleural effusion. Cardiac size within normal limits. Pulmonary vascularity is normal. Osseous structures are age-appropriate. No acute bone abnormality. IMPRESSION: No active disease. Electronically Signed   By: Fidela Salisbury MD   On: 07/02/2020 00:36    EKG: Independently reviewed.  Vent. rate 84 BPM PR interval * ms QRS duration 107 ms QT/QTc 362/428 ms P-R-T axes 80 -46 59 Sinus rhythm LAD, consider left anterior fascicular block Low voltage, precordial leads Consider anterior infarct  Assessment/Plan Principal Problem:   Fever Observation/telemetry. Work-up so far has been benign. Consider postvaccine fever if work-up remains negative. Acetaminophen as needed. Follow-up blood culture and sensitivity.  Active Problems:   Fall Consult physical therapy. Consult TOC.    OSA on CPAP Continue CPAP.    Hyperlipidemia associated with type 2 diabetes mellitus (HCC) Continue rosuvastatin 40 mg p.o. daily after med reconciliation.Marland Kitchen    GERD (gastroesophageal reflux disease)  Continue PPI.    Spastic hemiparesis affecting dominant side ([place]) Supportive care. PT evaluation in a.m.    HTN (hypertension) Continue ARB and beta-blocker after med reconciliation. Monitor blood pressure.    Type 2 diabetes, controlled, with neuropathy (HCC) Carbohydrate modified diet. CBG monitoring with R ISS. Resume gabapentin sitting after dosing confirmed.     Thrombocytopenia (HCC) Monitor platelet count.    Hyponatremia Mild in the setting of fever. Continue time-limited IV hydration.   DVT prophylaxis: Lovenox SQ. Code Status:   Full code. Family Communication: Disposition Plan:   Patient is from:  Home.  Anticipated DC to:  Home.  Anticipated DC date:  07/03/2020.  Anticipated DC barriers: Clinical status.  Consults called: Admission status:  Observation/telemetry.   Severity of Illness: High due to having a fall secondary to weakness due to fever.  Reubin Milan MD [facility]  How to contact the [facility] Attending or Consulting provider [place] or covering provider during after hours [place], for this patient?   1. Check the care team in [facility] and look for a) attending/consulting [facility] provider listed and b) the [facility] team listed 2. Log into [email or web address] and use [place]'s universal password to access. If you do not have the password, please contact the hospital operator. 3. Locate the [facility] provider you are looking for under [facility] and page to a number that you can be directly reached. 4. If you still have difficulty reaching the provider, please page the [facility] (Director on Call) for the Hospitalists listed on amion for assistance.  07/02/2020, 2:16 AM   This document was prepared using Dragon voice recognition software and may contain some unintended transcription errors.

## 2020-07-02 NOTE — ED Notes (Signed)
Wells Guiles Daughter would like an update [phone]

## 2020-07-02 NOTE — ED Notes (Signed)
Breakfast ordered 

## 2020-07-02 NOTE — ED Notes (Signed)
Pt glucose 45. PT a&ox4. 8oz of orange juice and short bread cookies provided and patient encouraged to eat. Will continue to monitor.

## 2020-07-03 DIAGNOSIS — W19XXXD Unspecified fall, subsequent encounter: Secondary | ICD-10-CM | POA: Diagnosis not present

## 2020-07-03 DIAGNOSIS — Z794 Long term (current) use of insulin: Secondary | ICD-10-CM

## 2020-07-03 DIAGNOSIS — E1122 Type 2 diabetes mellitus with diabetic chronic kidney disease: Secondary | ICD-10-CM | POA: Diagnosis present

## 2020-07-03 DIAGNOSIS — F32A Depression, unspecified: Secondary | ICD-10-CM | POA: Diagnosis present

## 2020-07-03 DIAGNOSIS — E785 Hyperlipidemia, unspecified: Secondary | ICD-10-CM | POA: Diagnosis present

## 2020-07-03 DIAGNOSIS — G2581 Restless legs syndrome: Secondary | ICD-10-CM | POA: Diagnosis present

## 2020-07-03 DIAGNOSIS — G43909 Migraine, unspecified, not intractable, without status migrainosus: Secondary | ICD-10-CM | POA: Diagnosis present

## 2020-07-03 DIAGNOSIS — W010XXA Fall on same level from slipping, tripping and stumbling without subsequent striking against object, initial encounter: Secondary | ICD-10-CM | POA: Diagnosis present

## 2020-07-03 DIAGNOSIS — R509 Fever, unspecified: Secondary | ICD-10-CM | POA: Diagnosis not present

## 2020-07-03 DIAGNOSIS — E11649 Type 2 diabetes mellitus with hypoglycemia without coma: Secondary | ICD-10-CM | POA: Diagnosis not present

## 2020-07-03 DIAGNOSIS — N1832 Chronic kidney disease, stage 3b: Secondary | ICD-10-CM | POA: Diagnosis present

## 2020-07-03 DIAGNOSIS — I10 Essential (primary) hypertension: Secondary | ICD-10-CM | POA: Diagnosis not present

## 2020-07-03 DIAGNOSIS — T50B95A Adverse effect of other viral vaccines, initial encounter: Secondary | ICD-10-CM | POA: Diagnosis present

## 2020-07-03 DIAGNOSIS — I69351 Hemiplegia and hemiparesis following cerebral infarction affecting right dominant side: Secondary | ICD-10-CM | POA: Diagnosis not present

## 2020-07-03 DIAGNOSIS — E871 Hypo-osmolality and hyponatremia: Secondary | ICD-10-CM | POA: Diagnosis present

## 2020-07-03 DIAGNOSIS — R5083 Postvaccination fever: Secondary | ICD-10-CM | POA: Diagnosis present

## 2020-07-03 DIAGNOSIS — E1169 Type 2 diabetes mellitus with other specified complication: Secondary | ICD-10-CM | POA: Diagnosis present

## 2020-07-03 DIAGNOSIS — E860 Dehydration: Secondary | ICD-10-CM | POA: Diagnosis present

## 2020-07-03 DIAGNOSIS — Z9181 History of falling: Secondary | ICD-10-CM | POA: Diagnosis not present

## 2020-07-03 DIAGNOSIS — E1140 Type 2 diabetes mellitus with diabetic neuropathy, unspecified: Secondary | ICD-10-CM | POA: Diagnosis present

## 2020-07-03 DIAGNOSIS — K219 Gastro-esophageal reflux disease without esophagitis: Secondary | ICD-10-CM | POA: Diagnosis not present

## 2020-07-03 DIAGNOSIS — Z85828 Personal history of other malignant neoplasm of skin: Secondary | ICD-10-CM | POA: Diagnosis not present

## 2020-07-03 DIAGNOSIS — D696 Thrombocytopenia, unspecified: Secondary | ICD-10-CM | POA: Diagnosis present

## 2020-07-03 DIAGNOSIS — N401 Enlarged prostate with lower urinary tract symptoms: Secondary | ICD-10-CM | POA: Diagnosis present

## 2020-07-03 DIAGNOSIS — Z20822 Contact with and (suspected) exposure to covid-19: Secondary | ICD-10-CM | POA: Diagnosis present

## 2020-07-03 DIAGNOSIS — Y92009 Unspecified place in unspecified non-institutional (private) residence as the place of occurrence of the external cause: Secondary | ICD-10-CM | POA: Diagnosis not present

## 2020-07-03 DIAGNOSIS — G4733 Obstructive sleep apnea (adult) (pediatric): Secondary | ICD-10-CM | POA: Diagnosis present

## 2020-07-03 DIAGNOSIS — N529 Male erectile dysfunction, unspecified: Secondary | ICD-10-CM | POA: Diagnosis present

## 2020-07-03 DIAGNOSIS — I129 Hypertensive chronic kidney disease with stage 1 through stage 4 chronic kidney disease, or unspecified chronic kidney disease: Secondary | ICD-10-CM | POA: Diagnosis present

## 2020-07-03 DIAGNOSIS — N138 Other obstructive and reflux uropathy: Secondary | ICD-10-CM | POA: Diagnosis present

## 2020-07-03 LAB — GLUCOSE, CAPILLARY
Glucose-Capillary: 90 mg/dL (ref 70–99)
Glucose-Capillary: 71 mg/dL (ref 70–99)
Glucose-Capillary: 67 mg/dL — ABNORMAL LOW (ref 70–99)
Glucose-Capillary: 112 mg/dL — ABNORMAL HIGH (ref 70–99)
Glucose-Capillary: 153 mg/dL — ABNORMAL HIGH (ref 70–99)

## 2020-07-03 MED ORDER — INSULIN GLARGINE 100 UNIT/ML ~~LOC~~ SOLN
30.0000 [IU] | Freq: Every day | SUBCUTANEOUS | Status: DC
  Administered 2020-07-03 – 2020-07-04 (×2): 30 [IU] via SUBCUTANEOUS
  Filled 2020-07-03 (×3): qty 0.3

## 2020-07-03 NOTE — NC FL2 (Signed)
[place] LEVEL OF CARE SCREENING TOOL     IDENTIFICATION  Patient Name: James Spurr, MD Birthdate: Jan 01, 1931 Sex: male Admission Date (Current Location): 07/01/2020  [facility] [place] Number:  Herbalist and Address:  [facility], [place] [address] [place], [place] [address]      Provider Number: [id number]  Attending Physician Name and Address:  Karie Kirks, DO  Relative Name and Phone Number:       Current Level of Care: Hospital Recommended Level of Care: [place] Prior Approval Number:    Date Approved/Denied:   PASRR Number: [id number]  Discharge Plan: SNF    Current Diagnoses: Patient Active Problem List   Diagnosis Date Noted  . Fever 07/02/2020  . Healthcare maintenance 06/01/2020  . Involuntary movements 04/25/2020  . Post-nasal drainage 07/16/2019  . Recurrent productive cough 05/31/2019  . Hx of basal cell carcinoma 07/10/2017  . Rosacea   . Gait disturbance, post-stroke 10/05/2015  . Presbycusis of both ears   . Advanced care planning/counseling discussion 06/23/2015  . Impaired mobility 04/23/2015  . Hearing loss of right ear due to cerumen impaction 03/23/2015  . HTN (hypertension) 03/18/2015  . Chronic diastolic heart failure, NYHA class 1 ([place]) 03/18/2015  . Spastic hemiparesis affecting dominant side ([place]) 07/07/2014  . Shoulder joint pain 03/16/2014  . Chest pressure 02/03/2014  . Medicare annual wellness visit, subsequent 11/06/2013  . Erectile dysfunction due to arterial insufficiency 02/05/2013  . MDD (major depressive disorder), recurrent episode, moderate ([place])   . Palpitations 06/26/2012  . Hyperlipidemia associated with type 2 diabetes mellitus ([place])   . Restless leg syndrome   . GERD (gastroesophageal reflux disease)   . BPH with obstruction/lower urinary tract symptoms   . History of CVA (cerebrovascular accident) 03/11/2012  . Hemiparesis affecting right  side as late effect of stroke ([place]) 03/11/2012  . Type 2 diabetes mellitus, uncontrolled, with neuropathy ([place]) 03/11/2012  . OSA on CPAP 03/11/2012  . CKD stage 3 secondary to diabetes ([place]) 03/11/2012  . Migraines 03/11/2012  . Type 2 diabetes, controlled, with neuropathy ([place]) 2011  . Thrombocytopenia ([place]) 2011  . Hyponatremia 2011  . Fall 2011    Orientation RESPIRATION BLADDER Height & Weight     Self, Time, Situation, Place  Normal Continent Weight:   Height:     BEHAVIORAL SYMPTOMS/MOOD NEUROLOGICAL BOWEL NUTRITION STATUS      Continent Diet (See discharge summary)  AMBULATORY STATUS COMMUNICATION OF NEEDS Skin   Limited Assist Verbally Normal                       Personal Care Assistance Level of Assistance  Bathing, Dressing, Feeding Bathing Assistance: Limited assistance Feeding assistance: Independent Dressing Assistance: Limited assistance Total Care Assistance: Limited assistance   Functional Limitations Info  Sight, Hearing, Speech Sight Info: Adequate Hearing Info: Impaired Speech Info: Adequate    SPECIAL CARE FACTORS FREQUENCY  PT (By licensed PT), OT (By licensed OT)     PT Frequency: 5x a week OT Frequency: 5x a week            Contractures Contractures Info: Not present    Additional Factors Info  Code Status, Allergies Code Status Info: Full Allergies Info: Gabapentin Lisinopril Nexium Niacin And Related           Current Medications (07/03/2020):  This is the current hospital active medication list Current Facility-Administered Medications  Medication Dose Route  Frequency Provider Last Rate Last Admin  . acetaminophen (TYLENOL) tablet 650 mg  650 mg Oral Q6H PRN Reubin Milan, MD   650 mg at 07/02/20 1839   Or  . acetaminophen (TYLENOL) suppository 650 mg  650 mg Rectal Q6H PRN Reubin Milan, MD      . ascorbic acid (VITAMIN C) tablet 1,000 mg  1,000 mg Oral Daily Swayze, Ava, DO   1,000 mg at 07/03/20 1024  .  celecoxib (CELEBREX) capsule 200 mg  200 mg Oral Daily Swayze, Ava, DO   200 mg at 07/03/20 1027  . clopidogrel (PLAVIX) tablet 75 mg  75 mg Oral Daily Swayze, Ava, DO   75 mg at 07/03/20 1025  . diclofenac Sodium (VOLTAREN) 1 % topical gel 2 g  2 g Topical TID PRN Swayze, Ava, DO      . DULoxetine (CYMBALTA) DR capsule 60 mg  60 mg Oral Daily Swayze, Ava, DO   60 mg at 07/03/20 1026  . enoxaparin (LOVENOX) injection 40 mg  40 mg Subcutaneous QHS Reubin Milan, MD   40 mg at 07/02/20 2043  . famotidine (PEPCID) tablet 20 mg  20 mg Oral QHS Swayze, Ava, DO   20 mg at 07/02/20 2044  . ferrous sulfate tablet 325 mg  325 mg Oral QODAY Swayze, Ava, DO   325 mg at 07/03/20 1027  . gabapentin (NEURONTIN) capsule 600 mg  600 mg Oral Daily Swayze, Ava, DO   600 mg at 07/03/20 0845  . gabapentin (NEURONTIN) capsule 600 mg  600 mg Oral Daily Swayze, Ava, DO   600 mg at 07/02/20 1925  . insulin aspart (novoLOG) injection 0-15 Units  0-15 Units Subcutaneous TID WC Reubin Milan, MD      . insulin aspart (novoLOG) injection 6 Units  6 Units Subcutaneous TID WC Swayze, Ava, DO   6 Units at 07/03/20 0845  . insulin glargine (LANTUS) injection 45 Units  45 Units Subcutaneous QHS Swayze, Ava, DO   45 Units at 07/02/20 2105  . irbesartan (AVAPRO) tablet 75 mg  75 mg Oral Daily Swayze, Ava, DO   75 mg at 07/03/20 1025  . l-methylfolate-B6-B12 (METANX) 3-35-2 MG per tablet 1 tablet  1 tablet Oral Daily Swayze, Ava, DO   1 tablet at 07/03/20 1026  . latanoprost (XALATAN) 0.005 % ophthalmic solution 1 drop  1 drop Right Eye QHS Swayze, Ava, DO   1 drop at 07/02/20 2044  . levothyroxine (SYNTHROID) tablet 50 mcg  50 mcg Oral QAC breakfast Swayze, Ava, DO   50 mcg at 07/03/20 0603  . metoprolol tartrate (LOPRESSOR) tablet 12.5 mg  12.5 mg Oral BID Swayze, Ava, DO   12.5 mg at 07/02/20 2043  . nitroGLYCERIN (NITROSTAT) SL tablet 0.4 mg  0.4 mg Sublingual Q5 min PRN Swayze, Ava, DO      . ondansetron (ZOFRAN)  tablet 4 mg  4 mg Oral Q6H PRN Reubin Milan, MD       Or  . ondansetron [facility]) injection 4 mg  4 mg Intravenous Q6H PRN Reubin Milan, MD      . oxyCODONE (Oxy IR/ROXICODONE) immediate release tablet 5 mg  5 mg Oral BID PRN Swayze, Ava, DO   5 mg at 07/03/20 0846  . pantoprazole (PROTONIX) EC tablet 40 mg  40 mg Oral Daily Swayze, Ava, DO   40 mg at 07/03/20 1024  . pramipexole (MIRAPEX) tablet 1 mg  1 mg Oral QHS Swayze, Ava,  DO   1 mg at 07/02/20 2055  . rosuvastatin (CRESTOR) tablet 40 mg  40 mg Oral Daily Swayze, Ava, DO   40 mg at 07/03/20 1025  . traZODone (DESYREL) tablet 50 mg  50 mg Oral QHS PRN Swayze, Ava, DO         Discharge Medications: Please see discharge summary for a list of discharge medications.  Relevant Imaging Results:  Relevant Lab Results:   Additional Information SSN: [id number]  Emeterio Reeve, [place]

## 2020-07-03 NOTE — Evaluation (Signed)
Occupational Therapy Evaluation Patient Details Name: James GONNELLA, MD MRN: [id number] DOB: January 13, 1931 Today's Date: 07/03/2020    History of Present Illness Pt is 86 y.o. male with PMH including of anemia, BPH, CKD stage III, history of CVA with right-sided hemiparesis, depression, rectal dysfunction, GERD, hiatal hernia,hyperlipidemia,, OSA on CPAP, restless leg syndrome, and DM2.  Pt presented to the ED after a fall.  CT head/cervical spine/abd/pelvis no acute abnormality. Pt found to have a fever. Pt received Musician Vaccine 2 days ago.   Clinical Impression   PTA patient independent using AD for mobility, ADLs and IADLs. Admitted for above and limited by problem list below, including decreased activity tolerance, impaired balance, R UE hemiparesis (chronic), and generalized weakness. Patient seated on [facility] upon entry, completing toileting with supervision and toilet transfers with mod assist.  Engaged in ADLs with min assist for UB/LB ADLs and setup for grooming.  He will benefit from further OT services while admitted and after dc at SNF level to optimize independence, safety and return to PLOF with ADLs, mobility prior to dc home alone. Will follow acutely.     Follow Up Recommendations  SNF;Supervision/Assistance - 24 hour    Equipment Recommendations  Other (comment) (TBD at next venue of care)    Recommendations for Other Services       Precautions / Restrictions Precautions Precautions: Fall Restrictions Weight Bearing Restrictions: No      Mobility Bed Mobility Overal bed mobility: Needs Assistance Bed Mobility: Sit to Supine       Sit to supine: Min guard   General bed mobility comments: transitioned back to supine with min guard for safety, able to reach overhead to reposition in bed with supervision   Transfers Overall transfer level: Needs assistance Equipment used: 1 person hand held assist Transfers: Sit to/from [employer] Sit to Stand: Min assist Stand pivot transfers: Mod assist       General transfer comment: pt on commode upon entry, min assist to power up and steady pivoting towards R side with mod assist to steady given increased time and hand held support to R UE     Balance Overall balance assessment: Needs assistance Sitting-balance support: No upper extremity supported;Feet supported Sitting balance-Leahy Scale: Fair Sitting balance - Comments: statically EOB with supervision, dynamcally engaging in LB dressing (socks) with min guard   Standing balance support: Single extremity supported;During functional activity Standing balance-Leahy Scale: Poor Standing balance comment: relies on exteranal and UE support                           ADL either performed or assessed with clinical judgement   ADL Overall ADL's : Needs assistance/impaired     Grooming: Set up;Sitting;Wash/dry hands   Upper Body Bathing: Minimal assistance;Sitting   Lower Body Bathing: Minimal assistance;Sit to/from stand   Upper Body Dressing : Minimal assistance;Sitting   Lower Body Dressing: Minimal assistance;Sit to/from stand Lower Body Dressing Details (indicate cue type and reason): min assist to adjust socks, relies on UE support in standing and min assist sit to stand  Toilet Transfer: Moderate assistance;Stand-pivot;BSC   Toileting- Clothing Manipulation and Hygiene: Supervision/safety;Sitting/lateral lean       Functional mobility during ADLs: Moderate assistance;Minimal assistance;Cueing for safety General ADL Comments: pt limited by R sided hemiparesis, impaired balance and decreased activity tolerance     Vision         Perception  Praxis      Pertinent Vitals/Pain Pain Assessment: No/denies pain     Hand Dominance     Extremity/Trunk Assessment Upper Extremity Assessment Upper Extremity Assessment: RUE deficits/detail RUE Deficits / Details: chronic deficits from  CVA, able to flex shoulder to 60*, abduct to 45*, full elbow extension and hand extension; discoordination  RUE Coordination: decreased gross motor;decreased fine motor   Lower Extremity Assessment Lower Extremity Assessment: Defer to PT evaluation       Communication Communication Communication: No difficulties   Cognition Arousal/Alertness: Awake/alert Behavior During Therapy: WFL for tasks assessed/performed Overall Cognitive Status: Within Functional Limits for tasks assessed                                     General Comments  VSS     Exercises     Shoulder Instructions      Home Living Family/patient expects to be discharged to:: Private residence Living Arrangements: Alone Available Help at Discharge: Family;Available PRN/intermittently Type of Home: Independent living facility (retirement community ) Home Access: Level entry     Home Layout: One level     Bathroom Shower/Tub: Occupational psychologist: Standard     Home Equipment: Environmental consultant - 4 wheels;Cane - quad;Grab bars - tub/shower;Shower seat          Prior Functioning/Environment Level of Independence: Independent with assistive device(s)        Comments: Pt reports completely independent with cane or walker.  Can ambulate in community, drive, do ADLs and IADLs        OT Problem List: Decreased strength;Decreased range of motion;Decreased activity tolerance;Impaired balance (sitting and/or standing);Decreased coordination;Decreased knowledge of use of DME or AE;Decreased knowledge of precautions;Impaired UE functional use      OT Treatment/Interventions: Self-care/ADL training;Energy conservation;Therapeutic exercise;Therapeutic activities;Balance training;Patient/family education    OT Goals(Current goals can be found in the care plan section) Acute Rehab OT Goals Patient Stated Goal: return to independence OT Goal Formulation: With patient Time For Goal Achievement:  07/17/20 Potential to Achieve Goals: Good  OT Frequency: Min 2X/week   Barriers to D/C:            Co-evaluation              AM-PAC OT "6 Clicks" Daily Activity     Outcome Measure Help from another person eating meals?: A Little Help from another person taking care of personal grooming?: A Little Help from another person toileting, which includes using toliet, bedpan, or urinal?: A Little Help from another person bathing (including washing, rinsing, drying)?: A Little Help from another person to put on and taking off regular upper body clothing?: A Little Help from another person to put on and taking off regular lower body clothing?: A Little 6 Click Score: 18   End of Session Equipment Utilized During Treatment: Gait belt Nurse Communication: Mobility status  Activity Tolerance: Patient tolerated treatment well Patient left: in bed;with call bell/phone within reach;with bed alarm set  OT Visit Diagnosis: Other abnormalities of gait and mobility (R26.89);Muscle weakness (generalized) (M62.81);Hemiplegia and hemiparesis Hemiplegia - Right/Left: Right Hemiplegia - caused by: Cerebral infarction (chronic)                Time: 9983-3825 OT Time Calculation (min): 20 min Charges:  OT General Charges $OT Visit: 1 Visit OT Evaluation $OT Eval Moderate Complexity: 1 Mod  Brielynn Sekula B, OT Acute Rehabilitation  Services Pager [phone] Office [place] 07/03/2020, 10:22 AM

## 2020-07-03 NOTE — TOC Initial Note (Signed)
Transition of Care [facility]) - Initial/Assessment Note    Patient Details  Name: James BEDNARCZYK, MD MRN: [id number] Date of Birth: 05-06-1931  Transition of [facility]) CM/SW Contact:    Emeterio Reeve, [place] Phone Number: 07/03/2020, 11:49 AM  Clinical Narrative:                  CSW met with pt at bedside. CSW introduced self and explained her role at the hospital.  PT reports Pts reports PTA he was living at home alone. Pt lives in a condo, there are no steps. Pt reports he was independent and completed ADLS independently. Pt reports having the covid vaccines and recently received the booster.   CSW reviewed PT/OT reccs of SNF. Pt reports that he believes SNF will be beneficial. Pt reports he went to snf 2-3 years ago, but does not remember the name of the facility. Pt would like to go to a facility around the [place] area.   CSW completed FL2 and has faxed the pt out to facilities.   TOC will continue to follow.   Expected Discharge Plan: Skilled Nursing Facility Barriers to Discharge: Continued Medical Work up, Ship broker   Patient Goals and CMS Choice Patient states their goals for this hospitalization and ongoing recovery are:: to return home CMS Medicare.gov Compare Post Acute Care list provided to:: Patient Choice offered to / list presented to : Patient  Expected Discharge Plan and Services Expected Discharge Plan: [place]       Living arrangements for the past 2 months: Apartment                                      Prior Living Arrangements/Services Living arrangements for the past 2 months: Apartment Lives with:: Self Patient language and need for interpreter reviewed:: No Do you feel safe going back to the place where you live?: Yes      Need for Family Participation in Patient Care: Yes (Comment) Care giver support system in place?: No (comment)   Criminal Activity/Legal Involvement Pertinent to Current  Situation/Hospitalization: No - Comment as needed  Activities of Daily Living Home Assistive Devices/Equipment: None ADL Screening (condition at time of admission) Patient's cognitive ability adequate to safely complete daily activities?: Yes Is the patient deaf or have difficulty hearing?: No Does the patient have difficulty seeing, even when wearing glasses/contacts?: No Does the patient have difficulty concentrating, remembering, or making decisions?: No Patient able to express need for assistance with ADLs?: Yes Does the patient have difficulty dressing or bathing?: Yes Independently performs ADLs?: Yes (appropriate for developmental age) Does the patient have difficulty walking or climbing stairs?: Yes Weakness of Legs: Both Weakness of Arms/Hands: Both  Permission Sought/Granted   Permission granted to share information with : Yes, Verbal Permission Granted     Permission granted to share info w AGENCY: SNF        Emotional Assessment Appearance:: Appears stated age Attitude/Demeanor/Rapport: Engaged Affect (typically observed): Appropriate Orientation: : Oriented to Self, Oriented to Place, Oriented to  Time, Oriented to Situation Alcohol / Substance Use: Not Applicable Psych Involvement: No (comment)  Admission diagnosis:  Fever [R50.9] Fall, initial encounter [W19.XXXA] Fever, unspecified fever cause [R50.9] Patient Active Problem List   Diagnosis Date Noted  . Fever 07/02/2020  . Healthcare maintenance 06/01/2020  . Involuntary movements 04/25/2020  . Post-nasal drainage 07/16/2019  . Recurrent productive  cough 05/31/2019  . Hx of basal cell carcinoma 07/10/2017  . Rosacea   . Gait disturbance, post-stroke 10/05/2015  . Presbycusis of both ears   . Advanced care planning/counseling discussion 06/23/2015  . Impaired mobility 04/23/2015  . Hearing loss of right ear due to cerumen impaction 03/23/2015  . HTN (hypertension) 03/18/2015  . Chronic diastolic heart  failure, NYHA class 1 ([place]) 03/18/2015  . Spastic hemiparesis affecting dominant side ([place]) 07/07/2014  . Shoulder joint pain 03/16/2014  . Chest pressure 02/03/2014  . Medicare annual wellness visit, subsequent 11/06/2013  . Erectile dysfunction due to arterial insufficiency 02/05/2013  . MDD (major depressive disorder), recurrent episode, moderate ([place])   . Palpitations 06/26/2012  . Hyperlipidemia associated with type 2 diabetes mellitus ([place])   . Restless leg syndrome   . GERD (gastroesophageal reflux disease)   . BPH with obstruction/lower urinary tract symptoms   . History of CVA (cerebrovascular accident) 03/11/2012  . Hemiparesis affecting right side as late effect of stroke ([place]) 03/11/2012  . Type 2 diabetes mellitus, uncontrolled, with neuropathy ([place]) 03/11/2012  . OSA on CPAP 03/11/2012  . CKD stage 3 secondary to diabetes ([place]) 03/11/2012  . Migraines 03/11/2012  . Type 2 diabetes, controlled, with neuropathy ([place]) 2011  . Thrombocytopenia ([place]) 2011  . Hyponatremia 2011  . Fall 2011   PCP:  [name], MD Pharmacy:   [place], [place] [address] [place] [address] Phone: [phone] Fax: [phone]  [id number] - [place], [name] - [place] AT [place] [address] [name] [address] Phone: [phone] Fax: [phone]     Social Determinants of Health ([place]) Interventions    Readmission Risk Interventions No flowsheet data found.  Emeterio Reeve, Latanya Presser, [place] Social Worker [phone]

## 2020-07-03 NOTE — Progress Notes (Signed)
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 He presents to [facility] on 06/30/2020 following a fall. He had received a Psychologist, prison and probation services vaccine on 06/29/2020. He denies rhinorrhea, sore throat, productive cough, nausea, emesis, diarrhea, flank pain or dysuria. However he did admit to feeling weaker earlier in the day before his fall.   He carries a past medical history significant for anemia, BPH, CKD stage III, history of CVA with right-sided hemiparesis, depression, rectal dysfunction, GERD, hiatal hernia,hyperlipidemia,, OSA on CPAP, restless leg syndrome, and DM2.   In the ED he was found to have a fever of 100.4. The remainder of his vitals were within normal limits. CBC was likewise normal with the exception of a slightly low sodium (134) and an elevated creatinine. EKG performed in the ED was essentially unchanged from previous EKG performed on 06/21/2019.  [facility] have been consulted to admit the patient for further evaluation and treatment.   The patient has been admitted to a telemetry bed. He has has no further fevers. He has been evaluated by PT/OT and their recommendation is for discharge to SNF. The patient is in agreement.  Consultants  . None  Procedures  . None  Antibiotics   Anti-infectives (From admission, onward)   None    .  Subjective  The patient is sitting up in bed. No new complaints. He has had 2 episodes of hypoglycemia today on 1/3 of the long acting insulin that he usually takes. He states that at home he regularly runs in the 200's. He states that he doesn't like the diet here.  Objective   Vitals:  Vitals:   07/03/20 0609 07/03/20 1516  BP: 109/60 (!) 99/54  Pulse: 74 61  Resp: 17 16  Temp: 98.9 F (37.2 C) 97.6 F (36.4 C)  SpO2: 95% 95%    Exam:  Constitutional:  . The patient is awake, alert, and  oriented x 3. No acute distress. Respiratory:  . No increased work of breathing. . No wheezes, rales, or rhonchi . No tactile fremitus Cardiovascular:  . Regular rate and rhythm . No murmurs, ectopy, or gallups. . No lateral PMI. No thrills. Abdomen:  . Abdomen is soft, non-tender, non-distended . No hernias, masses, or organomegaly . Normoactive bowel sounds.  Musculoskeletal:  . No cyanosis, clubbing, or edema Skin:  . No rashes, lesions, ulcers . palpation of skin: no induration or nodules Neurologic:  . CN 2-12 intact . Sensation all 4 extremities intact Psychiatric:  . Mental status o Mood, affect appropriate o Orientation to person, place, time  . judgment and insight appear intact   I have personally reviewed the following:   Today's Data  . BMP  Micro Data  . Blood cultures x 2: No growth . Urine culture: No growth  Imaging  . CT Head . CT C Spine . CT abdomen and pelvis  Cardiology Data   . EKG - unchanged from previous  Scheduled Meds: . vitamin C  1,000 mg Oral Daily  . celecoxib  200 mg Oral Daily  . clopidogrel  75 mg Oral Daily  . DULoxetine  60 mg Oral Daily  . enoxaparin (LOVENOX) injection  40 mg Subcutaneous QHS  . famotidine  20 mg Oral QHS  . ferrous sulfate  325 mg Oral QODAY  . gabapentin  600 mg Oral Daily  . gabapentin  600 mg  Oral Daily  . insulin aspart  0-15 Units Subcutaneous TID WC  . insulin glargine  45 Units Subcutaneous QHS  . irbesartan  75 mg Oral Daily  . l-methylfolate-B6-B12  1 tablet Oral Daily  . latanoprost  1 drop Right Eye QHS  . levothyroxine  50 mcg Oral QAC breakfast  . metoprolol tartrate  12.5 mg Oral BID  . pantoprazole  40 mg Oral Daily  . pramipexole  1 mg Oral QHS  . rosuvastatin  40 mg Oral Daily   Continuous Infusions:  Principal Problem:   Fever Active Problems:   OSA on CPAP   Hyperlipidemia associated with type 2 diabetes mellitus (HCC)   GERD (gastroesophageal reflux disease)   Spastic  hemiparesis affecting dominant side (HCC)   HTN (hypertension)   Type 2 diabetes, controlled, with neuropathy ([place])   Thrombocytopenia ([place])   Hyponatremia   Fall   LOS: 0 days   A & P   Fever: No infectious source of fever is identified. Most likely a post-vaccine fever. Blood cultures have had no growth. Urine culture is negative.   Fall: Likely due to weakness and fever. PT has evaluated the patient and has recommended SNF placement. The patient agrees that he will benefit from rehab at [facility]. TOC consutled.  OSA on CPAP: Continue CPAP.  Hyperlipidemia associated with type 2 diabetes mellitus ([place]): Continue rosuvastatin 40 mg p.o. daily after med reconciliation.  GERD (gastroesophageal reflux disease): Continue PPI.  Spastic hemiparesis affecting dominant side ([place]): Supportive care.  Essential Hypertension: Blood pressure is actually low on his home dosages of metoprolol irbesartan. Given renal insufficiency I will discontinue the irbesartan. Monitor.  Hypoglycemia/Type 2 diabetes/Diabetic neuropathy ([place]): The patient has had a total of 4 episodes of hypoglycemia since presentation. Two of them in the last 24 hours. This is despite reduction in his long acting insulin from 80 units to 45. This has again been reduced to 30 units. The patient states that his blood sugars usually run in the 200's at home. He uses a CGM which he states gives a slightly higher value than the glucometer used by the hospital staff does. I wonder if indeed he always knows when he is low. If not, it is possible that his falls and weakness may be in part due to hypoglycemia unaware. Monitor. He has been restarted on his gabapentin. Glucoses are followed with FSBS and SSI.  Thrombocytopenia ([place]): Monitor platelet count.  Hyponatremia: Resolved. Due to dehydration related to fever.  I have seen and examined this patient myself. I have spent 35 minutes in his evaluation and care.   DVT  prophylaxis:      Lovenox SQ. Code Status:              Full code. Family Communication: Disposition Plan:              Patient is from:                        Home.             Anticipated DC to:                   SNF             Anticipated DC date:               10/118/2021.             Anticipated DC barriers:  No safe discharge           Kimiye Strathman, DO [facility] Direct contact: see [email or web address]  7PM-7AM contact night coverage as above 07/03/2020, 6:24 PM  LOS: 0 days

## 2020-07-04 DIAGNOSIS — R5381 Other malaise: Secondary | ICD-10-CM

## 2020-07-04 LAB — BASIC METABOLIC PANEL
Anion gap: 10 (ref 5–15)
BUN: 22 mg/dL (ref 8–23)
CO2: 24 mmol/L (ref 22–32)
Calcium: 8.4 mg/dL — ABNORMAL LOW (ref 8.9–10.3)
Chloride: 104 mmol/L (ref 98–111)
Creatinine, Ser: 1.61 mg/dL — ABNORMAL HIGH (ref 0.61–1.24)
GFR, Estimated: 38 mL/min — ABNORMAL LOW (ref >60)
Glucose, Bld: 98 mg/dL (ref 70–99)
Potassium: 3.9 mmol/L (ref 3.5–5.1)
Sodium: 138 mmol/L (ref 135–145)

## 2020-07-04 LAB — GLUCOSE, CAPILLARY
Glucose-Capillary: 77 mg/dL (ref 70–99)
Glucose-Capillary: 94 mg/dL (ref 70–99)
Glucose-Capillary: 85 mg/dL (ref 70–99)
Glucose-Capillary: 137 mg/dL — ABNORMAL HIGH (ref 70–99)

## 2020-07-04 MED ORDER — ASCORBIC ACID 500 MG PO TABS
1000.0000 mg | ORAL_TABLET | Freq: Every day | ORAL | Status: DC
  Administered 2020-07-05 – 2020-07-06 (×2): 1000 mg via ORAL
  Filled 2020-07-04 (×2): qty 2

## 2020-07-04 MED ORDER — METOPROLOL TARTRATE 12.5 MG HALF TABLET
12.5000 mg | ORAL_TABLET | Freq: Two times a day (BID) | ORAL | Status: DC
  Administered 2020-07-04 – 2020-07-06 (×4): 12.5 mg via ORAL
  Filled 2020-07-04 (×4): qty 1

## 2020-07-04 MED ORDER — GABAPENTIN 300 MG PO CAPS
600.0000 mg | ORAL_CAPSULE | Freq: Every day | ORAL | Status: DC
  Administered 2020-07-05 – 2020-07-06 (×2): 600 mg via ORAL
  Filled 2020-07-04 (×2): qty 2

## 2020-07-04 MED ORDER — CLOPIDOGREL BISULFATE 75 MG PO TABS
75.0000 mg | ORAL_TABLET | Freq: Every day | ORAL | Status: DC
  Administered 2020-07-05 – 2020-07-06 (×2): 75 mg via ORAL
  Filled 2020-07-04 (×2): qty 1

## 2020-07-04 MED ORDER — L-METHYLFOLATE-B6-B12 3-35-2 MG PO TABS
1.0000 | ORAL_TABLET | Freq: Every day | ORAL | Status: DC
  Administered 2020-07-05 – 2020-07-06 (×2): 1 via ORAL
  Filled 2020-07-04 (×2): qty 1

## 2020-07-04 MED ORDER — PRAMIPEXOLE DIHYDROCHLORIDE 0.125 MG PO TABS
1.0000 mg | ORAL_TABLET | Freq: Every day | ORAL | Status: DC
  Administered 2020-07-04 – 2020-07-05 (×2): 1 mg via ORAL
  Filled 2020-07-04 (×2): qty 8

## 2020-07-04 NOTE — Progress Notes (Signed)
Patient has home CPAP at bedside. He states he is able to place himself on/off as needed. No assistance needed from RT at this time. Patient aware to call for assistance  If needed.

## 2020-07-04 NOTE — Plan of Care (Signed)

## 2020-07-04 NOTE — Progress Notes (Signed)
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 He had received a Psychologist, prison and probation services vaccine on 06/29/2020. He denies rhinorrhea, sore throat, productive cough, nausea, emesis, diarrhea, flank pain or dysuria. However he did admit to feeling weaker earlier in the day before his fall.   He carries a past medical history significant for anemia, BPH, CKD stage III, history of CVA with right-sided hemiparesis, depression, rectal dysfunction, GERD, hiatal hernia,hyperlipidemia,, OSA on CPAP, restless leg syndrome, and DM2.   In the ED he was found to have a fever of 100.4. The remainder of his vitals were within normal limits. CBC was likewise normal with the exception of a slightly low sodium (134) and an elevated creatinine. EKG performed in the ED was essentially unchanged from previous EKG performed on 06/21/2019.  [facility] have been consulted to admit the patient for further evaluation and treatment.   The patient has been admitted to a telemetry bed. He has has no further fevers. He has been evaluated by PT/OT and their recommendation is for discharge to SNF. The patient is in agreement.  Consultants  . None  Procedures  . None  Antibiotics   Anti-infectives (From admission, onward)   None     Subjective  The patient is sitting up in bed. No new complaints. He does ask that his medications aare staggered as he feels that he takes too many at one time.  Objective   Vitals:  Vitals:   07/04/20 1040 07/04/20 1408  BP: 125/65 94/72  Pulse: 69 65  Resp:  18  Temp:  98.3 F (36.8 C)  SpO2:  95%    Exam:  Constitutional:  . The patient is awake, alert, and oriented x 3. No acute distress. Respiratory:  . No increased work of breathing. . No wheezes, rales, or rhonchi . No tactile  fremitus Cardiovascular:  . Regular rate and rhythm . No murmurs, ectopy, or gallups. . No lateral PMI. No thrills. Abdomen:  . Abdomen is soft, non-tender, non-distended . No hernias, masses, or organomegaly . Normoactive bowel sounds.  Musculoskeletal:  . No cyanosis, clubbing, or edema Skin:  . No rashes, lesions, ulcers . palpation of skin: no induration or nodules Neurologic:  . CN 2-12 intact . Sensation all 4 extremities intact Psychiatric:  . Mental status o Mood, affect appropriate o Orientation to person, place, time  . judgment and insight appear intact   I have personally reviewed the following:   Today's Data  . BMP  Micro Data  . Blood cultures x 2: No growth . Urine culture: No growth  Imaging  . CT Head . CT C Spine . CT abdomen and pelvis  Cardiology Data   . EKG - unchanged from previous  Scheduled Meds: . vitamin C  1,000 mg Oral Daily  . celecoxib  200 mg Oral Daily  . clopidogrel  75 mg Oral Daily  . DULoxetine  60 mg Oral Daily  . enoxaparin (LOVENOX) injection  40 mg Subcutaneous QHS  . famotidine  20 mg Oral QHS  . ferrous sulfate  325 mg Oral QODAY  . gabapentin  600 mg Oral Daily  . gabapentin  600 mg Oral Daily  . insulin aspart  0-15 Units Subcutaneous TID WC  . insulin glargine  30 Units Subcutaneous QHS  . l-methylfolate-B6-B12  1 tablet Oral Daily  . latanoprost  1 drop Right Eye QHS  . levothyroxine  50 mcg Oral QAC breakfast  . metoprolol tartrate  12.5 mg Oral BID  . pantoprazole  40 mg Oral Daily  . pramipexole  1 mg Oral QHS  . rosuvastatin  40 mg Oral Daily   Continuous Infusions:  Principal Problem:   Fever Active Problems:   OSA on CPAP   Hyperlipidemia associated with type 2 diabetes mellitus (HCC)   GERD (gastroesophageal reflux disease)   Spastic hemiparesis affecting dominant side (HCC)   HTN (hypertension)   Type 2 diabetes, controlled, with neuropathy ([place])   Thrombocytopenia ([place])   Hyponatremia    Fall   LOS: 1 day   A & P   Fever: No infectious source of fever is identified. Most likely a post-vaccine fever. Blood cultures have had no growth. Urine culture is negative.   Fall: Likely due to weakness and fever. PT has evaluated the patient and has recommended SNF placement. The patient agrees that he will benefit from rehab at [facility]. TOC consutled.  OSA on CPAP: Continue CPAP.  Hyperlipidemia associated with type 2 diabetes mellitus ([place]): Continue rosuvastatin 40 mg p.o. daily after med reconciliation.  GERD (gastroesophageal reflux disease): Continue PPI.  Spastic hemiparesis affecting dominant side ([place]): Supportive care.  Essential Hypertension: Blood pressure is actually low on his home dosages of metoprolol irbesartan. Given renal insufficiency I will discontinue the irbesartan. Monitor.  Hypoglycemia/Type 2 diabetes/Diabetic neuropathy ([place]): The patient has had a total of 4 episodes of hypoglycemia since presentation. Two of them in the last 24 hours. This is despite reduction in his long acting insulin from 80 units to 45. This has again been reduced to 30 units. The patient states that his blood sugars usually run in the 200's at home. He uses a CGM which he states gives a slightly higher value than the glucometer used by the hospital staff does. I wonder if indeed he always knows when he is low. If not, it is possible that his falls and weakness may be in part due to hypoglycemia unaware. Monitor. He has been restarted on his gabapentin. Glucoses are followed with FSBS and SSI.  Thrombocytopenia ([place]): Monitor platelet count.  Hyponatremia: Resolved. Due to dehydration related to fever.  I have seen and examined this patient myself. I have spent 30 minutes in his evaluation and care.   DVT prophylaxis:      Lovenox SQ. Code Status:              Full code. Family Communication: Disposition Plan:              Patient is from:                        Home.              Anticipated DC to:                   SNF             Anticipated DC date:               10/118/2021.             Anticipated DC barriers:         No safe discharge           Dayan Kreis, DO [facility] Direct contact: see [email or web address]  7PM-7AM contact night coverage  as above 07/04/2020, 4:45 PM  LOS: 0 days

## 2020-07-04 NOTE — Plan of Care (Signed)
  Problem: Education: Goal: Knowledge of General Education information will improve Description Including pain rating scale, medication(s)/side effects and non-pharmacologic comfort measures Outcome: Progressing   

## 2020-07-05 LAB — GLUCOSE, CAPILLARY
Glucose-Capillary: 62 mg/dL — ABNORMAL LOW (ref 70–99)
Glucose-Capillary: 148 mg/dL — ABNORMAL HIGH (ref 70–99)
Glucose-Capillary: 94 mg/dL (ref 70–99)
Glucose-Capillary: 158 mg/dL — ABNORMAL HIGH (ref 70–99)

## 2020-07-05 MED ORDER — INSULIN GLARGINE 100 UNIT/ML ~~LOC~~ SOLN
20.0000 [IU] | Freq: Every day | SUBCUTANEOUS | Status: DC
  Administered 2020-07-05: 20 [IU] via SUBCUTANEOUS
  Filled 2020-07-05 (×2): qty 0.2

## 2020-07-05 NOTE — Plan of Care (Signed)
  Problem: Education: Goal: Knowledge of General Education information will improve Description Including pain rating scale, medication(s)/side effects and non-pharmacologic comfort measures Outcome: Progressing   

## 2020-07-05 NOTE — Progress Notes (Signed)
  Patient has home CPAP equipment at bedside. Able to place himself on/off as needed. Aware to call for assistance if needed.

## 2020-07-05 NOTE — Progress Notes (Signed)
Physical Therapy Treatment Patient Details Name: LUISFERNANDO BRIGHTWELL, MD MRN: [id number] DOB: 1930/10/10 Today's Date: 07/05/2020    History of Present Illness Pt is 86 y.o. male with PMH including of anemia, BPH, CKD stage III, history of CVA with right-sided hemiparesis, depression, rectal dysfunction, GERD, hiatal hernia,hyperlipidemia,, OSA on CPAP, restless leg syndrome, and DM2.  Pt presented to the ED after a fall.  CT head/cervical spine/abd/pelvis no acute abnormality. Pt found to have a fever. Pt received Musician Vaccine 2 days ago.    PT Comments    Pt was seen for mobility of gait and there ex to BLE's and RUE to increase ROM and upright posture.  Pt is having a significant amount of residual weakness and tone on RUE, so discussed his best option to increase WB and work on scapular location of R UE.  Pt is walking farther today without a buckling feeling on RLE, but is laterally unstable with turns.  Focus on RLE strength, effective use of RUE possibly on a platform and standing posture to normalize R side use.   Follow Up Recommendations  SNF     Equipment Recommendations  None recommended by PT    Recommendations for Other Services       Precautions / Restrictions Precautions Precautions: Fall Restrictions Weight Bearing Restrictions: No    Mobility  Bed Mobility               General bed mobility comments: on side of  bed when PT arrived  Transfers Overall transfer level: Needs assistance Equipment used: 1 person hand held assist Transfers: Sit to/from Stand Sit to Stand: Min assist Stand pivot transfers: Mod assist          Ambulation/Gait Ambulation/Gait assistance: Mod assist Gait Distance (Feet): 25 Feet Assistive device: 1 person hand held assist Gait Pattern/deviations: Step-to pattern;Shuffle;Decreased stride length;Decreased weight shift to right Gait velocity: decreased Gait velocity interpretation: <1.8 ft/sec, indicate of  risk for recurrent falls General Gait Details: had one R lateral shift turning to return to bedside, but could maintain balance with assist   Stairs             Wheelchair Mobility    Modified Rankin (Stroke Patients Only) Modified Rankin (Stroke Patients Only) Pre-Morbid Rankin Score: Moderate disability Modified Rankin: Moderately severe disability     Balance Overall balance assessment: Needs assistance Sitting-balance support: Feet supported Sitting balance-Leahy Scale: Fair Sitting balance - Comments: lists backward with LE resisted ex Postural control: Posterior lean Standing balance support: Single extremity supported;Bilateral upper extremity supported Standing balance-Leahy Scale: Poor                              Cognition Arousal/Alertness: Awake/alert Behavior During Therapy: WFL for tasks assessed/performed Overall Cognitive Status: Within Functional Limits for tasks assessed                                 General Comments: following instructions well for all exercises      Exercises General Exercises - Upper Extremity Shoulder Flexion: Self ROM Shoulder Extension: Self ROM;Other (comment) (yoga wb on RUE with scap depression) General Exercises - Lower Extremity Long Arc Quad: Strengthening;10 reps Heel Slides: Strengthening;10 reps Hip ABduction/ADduction: Strengthening;10 reps    General Comments        Pertinent Vitals/Pain Pain Assessment: No/denies pain    Home Living  Prior Function            PT Goals (current goals can now be found in the care plan section) Acute Rehab PT Goals Patient Stated Goal: return to independence Progress towards PT goals: Progressing toward goals    Frequency    Min 3X/week      PT Plan Current plan remains appropriate    Co-evaluation              AM-PAC PT "6 Clicks" Mobility   Outcome Measure  Help needed turning from your  back to your side while in a flat bed without using bedrails?: A Little Help needed moving from lying on your back to sitting on the side of a flat bed without using bedrails?: A Lot Help needed moving to and from a bed to a chair (including a wheelchair)?: A Lot Help needed standing up from a chair using your arms (e.g., wheelchair or bedside chair)?: A Lot Help needed to walk in hospital room?: A Lot Help needed climbing 3-5 steps with a railing? : Total 6 Click Score: 12    End of Session Equipment Utilized During Treatment: Gait belt Activity Tolerance: Patient tolerated treatment well Patient left: in bed;with call bell/phone within reach Nurse Communication: Mobility status PT Visit Diagnosis: Other abnormalities of gait and mobility (R26.89);Muscle weakness (generalized) (M62.81);History of falling (Z91.81)     Time: 9753-0051 PT Time Calculation (min) (ACUTE ONLY): 33 min  Charges:  $Gait Training: 8-22 mins $Therapeutic Exercise: 8-22 mins $Therapeutic Activity: 8-22 mins                   Ramond Dial 07/05/2020, 8:00 PM  Mee Hives, PT MS Acute Rehab Dept. Number: [place] [facility] [place]

## 2020-07-05 NOTE — Progress Notes (Signed)
Inpatient Diabetes Program Recommendations  AACE/ADA: New Consensus Statement on Inpatient Glycemic Control (2015)  Target Ranges:  Prepandial:   less than 140 mg/dL      Peak postprandial:   less than 180 mg/dL (1-2 hours)      Critically ill patients:  140 - 180 mg/dL   Lab Results  Component Value Date   GLUCAP 62 (L) 07/05/2020   HGBA1C 7.9 (A) 06/01/2020    Review of Glycemic Control Results for James Bell, Marcheta Grammes, MD "DR. [place]" (MRN [id number]) as of 07/05/2020 10:30  Ref. Range 07/04/2020 07:25 07/04/2020 11:53 07/04/2020 15:54 07/04/2020 20:29 07/05/2020 08:04  Glucose-Capillary Latest Ref Range: 70 - 99 mg/dL 77 94 85 137 (H) 62 (L)   Diabetes history: DM 2 Outpatient Diabetes medications: Tresiba 90 units qhs, Regular insulin 6 units tid, Semaglutide 0.5 mg weekly Current orders for Inpatient glycemic control:  Lantus 30 units Novolog 0-15 units tid  Inpatient Diabetes Program Recommendations:    Glucose trends below inpatient goal with hypoglycemia this am.   -  Consider reducing Lantus to 20 units. -  Consider reducing Novolog Correction scale to "sensitive" 0-9 units tid  Thanks,  Tama Headings RN, MSN, BC-ADM Inpatient Diabetes Coordinator Team Pager [phone] (8a-5p)

## 2020-07-05 NOTE — Progress Notes (Addendum)
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 He presents to [facility] on 06/30/2020 following a fall. He had received a Psychologist, prison and probation services vaccine on 06/29/2020. He denies rhinorrhea, sore throat, productive cough, nausea, emesis, diarrhea, flank pain or dysuria. However he did admit to feeling weaker earlier in the day before his fall.    He carries a past medical history significant for  anemia, BPH, CKD stage III, history of CVA with right-sided hemiparesis, depression, rectal dysfunction, GERD, hiatal hernia,hyperlipidemia,, OSA on CPAP, restless leg syndrome, and DM2.    In the ED he was found to have a fever of 100.4. The remainder of his vitals were within normal limits. CBC was likewise normal with the exception of a slightly low sodium (134) and an elevated creatinine. EKG performed in the ED was essentially unchanged from previous EKG performed on 06/21/2019.   [facility] have been consulted to admit the patient for further evaluation and treatment.   The patient has been admitted to a telemetry bed. He has has no further fevers. He has been evaluated by PT/OT and their recommendation is for discharge to SNF. The patient is in agreement.  Consultants  None  Procedures  None  Antibiotics   Anti-infectives (From admission, onward)    None      Subjective  The patient is sitting up in bed. No new complaints. He does ask that his medications aare staggered as he feels that he takes too many at one time.  Objective   Vitals:  Vitals:   07/05/20 0512 07/05/20 1400  BP: (!) 109/50 96/68  Pulse: 71 65  Resp: 17 16  Temp: (!) 97.5 F (36.4 C) 97.9 F (36.6 C)  SpO2: 94% 100%    Exam:  Constitutional:  The patient is awake, alert, and oriented x 3. No acute distress. Respiratory:  No increased work of breathing. No wheezes, rales, or  rhonchi No tactile fremitus Cardiovascular:  Regular rate and rhythm No murmurs, ectopy, or gallups. No lateral PMI. No thrills. Abdomen:  Abdomen is soft, non-tender, non-distended No hernias, masses, or organomegaly Normoactive bowel sounds.  Musculoskeletal:  No cyanosis, clubbing, or edema Skin:  No rashes, lesions, ulcers palpation of skin: no induration or nodules Neurologic:  CN 2-12 intact Sensation all 4 extremities intact Psychiatric:  Mental status Mood, affect appropriate Orientation to person, place, time  judgment and insight appear intact   I have personally reviewed the following:   Today's Data  BMP  Micro Data  Blood cultures x 2: No growth Urine culture: No growth  Imaging  CT Head CT C Spine CT abdomen and pelvis  Cardiology Data   EKG - unchanged from previous  Scheduled Meds:  vitamin C  1,000 mg Oral Daily   celecoxib  200 mg Oral Daily   clopidogrel  75 mg Oral Daily   DULoxetine  60 mg Oral Daily   enoxaparin (LOVENOX) injection  40 mg Subcutaneous QHS   famotidine  20 mg Oral QHS   ferrous sulfate  325 mg Oral QODAY   gabapentin  600 mg Oral Daily   gabapentin  600 mg Oral Daily   insulin aspart  0-15 Units Subcutaneous TID WC   insulin glargine  20 Units Subcutaneous QHS   l-methylfolate-B6-B12  1 tablet Oral Daily   latanoprost  1 drop Right Eye QHS   levothyroxine  50 mcg Oral QAC breakfast   metoprolol tartrate  12.5 mg Oral BID   pantoprazole  40 mg Oral Daily   pramipexole  1 mg Oral QHS   rosuvastatin  40 mg Oral Daily   Continuous Infusions:   Principal Problem:   Fever Active Problems:   OSA on CPAP   Hyperlipidemia associated with type 2 diabetes mellitus (HCC)   GERD (gastroesophageal reflux disease)   Spastic hemiparesis affecting dominant side (HCC)   HTN (hypertension)   Type 2 diabetes, controlled, with neuropathy ([place])   Thrombocytopenia ([place])   Hyponatremia   Fall   LOS: 2 days   A & P    Fever: No infectious source of fever is identified. Most likely a post-vaccine fever. Blood cultures have had no growth. Urine culture is negative.   Fall: Likely due to weakness and fever. PT has evaluated the patient and has recommended SNF placement. The patient agrees that he will benefit from rehab at [facility]. TOC consutled.   OSA on CPAP: Continue CPAP.   Hyperlipidemia associated with type 2 diabetes mellitus ([place]): Continue rosuvastatin 40 mg p.o. daily after med reconciliation.  CKD 3b: stable creatinine appears to be at baseline.   GERD (gastroesophageal reflux disease): Continue PPI.   Spastic hemiparesis affecting dominant side ([place]): Supportive care.   Essential Hypertension: Blood pressure is actually low on his home dosages of metoprolol irbesartan. Given renal insufficiency I will discontinue the irbesartan. Monitor.   Hypoglycemia/Type 2 diabetes/Diabetic neuropathy ([place]): The patient has had a total of 4 episodes of hypoglycemia since presentation. Two of them in the last 24 hours. This is despite reduction in his long acting insulin from 80 units to 45. This has again been reduced to 30 units. The patient states that his blood sugars usually run in the 200's at home. He uses a CGM which he states gives a slightly higher value than the glucometer used by the hospital staff does. I wonder if indeed he always knows when he is low. If not, it is possible that his falls and weakness may be in part due to hypoglycemia unaware. Monitor. He has been restarted on his gabapentin. Glucoses are followed with FSBS and SSI.   Thrombocytopenia ([place]): Monitor platelet count.   Hyponatremia: Resolved. Due to dehydration related to fever.  I have seen and examined this patient myself. I have spent 32 minutes in his evaluation and care.     DVT prophylaxis:      Lovenox SQ. Code Status:              Full code. Family Communication: Disposition Plan:              Patient is from:                         Home.             Anticipated DC to:                   SNF             Anticipated DC date:               10/118/2021.             Anticipated DC barriers:         No safe discharge           Patterson Hollenbaugh, DO [facility] Direct contact: see [email or web address]  7PM-7AM contact night coverage as above 07/05/2020, 4:13 PM  LOS: 0 days

## 2020-07-05 NOTE — TOC Progression Note (Addendum)
Transition of Care [facility]) - Progression Note    Patient Details  Name: James NASTASI, MD MRN: [id number] Date of Birth: November 17, 1930  Transition of Care [facility]) CM/SW [place] [place] Phone Number: 07/05/2020, 11:59 AM  Clinical Narrative:     CSW met with pt to discuss SNF choice. Pt is now considering Home with HH. He lives alone but states he is feeling  Much better and is able to walk to the bathroom. He states he has an Transport planner to get around home. Has a daughter in [place] he wants to discuss his decision with. CSW to check back later in afternoon.   Spoke with pt bedside and daughter on telephone. They want snf but want [facility] in [place].   CSW contacted [facility] in [place] after sending referral in hub. They extend bed offer. CSW to call following day to confirm d/c date.   Auth started and clinicals faxed to [facility]. Ref #[id number]  Pt is okay with [facility] in [place]. CSW will continue follow through D/C  Expected Discharge Plan: [place] Barriers to Discharge: Continued Medical Work up, Ship broker  Expected Discharge Plan and Services Expected Discharge Plan: [place]       Living arrangements for the past 2 months: Apartment                                       Social Determinants of Health (SDOH) Interventions    Readmission Risk Interventions No flowsheet data found.

## 2020-07-06 DIAGNOSIS — K219 Gastro-esophageal reflux disease without esophagitis: Secondary | ICD-10-CM | POA: Diagnosis not present

## 2020-07-06 DIAGNOSIS — R279 Unspecified lack of coordination: Secondary | ICD-10-CM | POA: Diagnosis not present

## 2020-07-06 DIAGNOSIS — Z7401 Bed confinement status: Secondary | ICD-10-CM | POA: Diagnosis not present

## 2020-07-06 DIAGNOSIS — G4733 Obstructive sleep apnea (adult) (pediatric): Secondary | ICD-10-CM | POA: Diagnosis not present

## 2020-07-06 DIAGNOSIS — W19XXXA Unspecified fall, initial encounter: Secondary | ICD-10-CM | POA: Diagnosis not present

## 2020-07-06 DIAGNOSIS — M6281 Muscle weakness (generalized): Secondary | ICD-10-CM | POA: Diagnosis not present

## 2020-07-06 DIAGNOSIS — R531 Weakness: Secondary | ICD-10-CM

## 2020-07-06 DIAGNOSIS — I10 Essential (primary) hypertension: Secondary | ICD-10-CM | POA: Diagnosis not present

## 2020-07-06 DIAGNOSIS — R5381 Other malaise: Secondary | ICD-10-CM | POA: Diagnosis not present

## 2020-07-06 DIAGNOSIS — T50B95A Adverse effect of other viral vaccines, initial encounter: Secondary | ICD-10-CM | POA: Diagnosis not present

## 2020-07-06 DIAGNOSIS — E1140 Type 2 diabetes mellitus with diabetic neuropathy, unspecified: Secondary | ICD-10-CM | POA: Diagnosis not present

## 2020-07-06 DIAGNOSIS — I69951 Hemiplegia and hemiparesis following unspecified cerebrovascular disease affecting right dominant side: Secondary | ICD-10-CM | POA: Diagnosis not present

## 2020-07-06 DIAGNOSIS — R1312 Dysphagia, oropharyngeal phase: Secondary | ICD-10-CM | POA: Diagnosis not present

## 2020-07-06 DIAGNOSIS — E11649 Type 2 diabetes mellitus with hypoglycemia without coma: Secondary | ICD-10-CM | POA: Diagnosis not present

## 2020-07-06 DIAGNOSIS — M2550 Pain in unspecified joint: Secondary | ICD-10-CM | POA: Diagnosis not present

## 2020-07-06 DIAGNOSIS — W19XXXD Unspecified fall, subsequent encounter: Secondary | ICD-10-CM | POA: Diagnosis not present

## 2020-07-06 DIAGNOSIS — R2689 Other abnormalities of gait and mobility: Secondary | ICD-10-CM | POA: Diagnosis not present

## 2020-07-06 DIAGNOSIS — R488 Other symbolic dysfunctions: Secondary | ICD-10-CM | POA: Diagnosis not present

## 2020-07-06 DIAGNOSIS — E1169 Type 2 diabetes mellitus with other specified complication: Secondary | ICD-10-CM | POA: Diagnosis not present

## 2020-07-06 DIAGNOSIS — I639 Cerebral infarction, unspecified: Secondary | ICD-10-CM | POA: Diagnosis not present

## 2020-07-06 DIAGNOSIS — R509 Fever, unspecified: Secondary | ICD-10-CM | POA: Diagnosis not present

## 2020-07-06 LAB — GLUCOSE, CAPILLARY
Glucose-Capillary: 142 mg/dL — ABNORMAL HIGH (ref 70–99)
Glucose-Capillary: 96 mg/dL (ref 70–99)

## 2020-07-06 LAB — SARS CORONAVIRUS 2 BY RT PCR (HOSPITAL ORDER, PERFORMED IN ~~LOC~~ HOSPITAL LAB): SARS Coronavirus 2: NEGATIVE

## 2020-07-06 MED ORDER — OXYCODONE HCL 5 MG PO TABS
5.0000 mg | ORAL_TABLET | Freq: Two times a day (BID) | ORAL | 0 refills | Status: DC | PRN
Start: 2020-07-06 — End: 2020-11-05

## 2020-07-06 MED ORDER — INSULIN GLARGINE 100 UNIT/ML ~~LOC~~ SOLN: 20.0000 [IU] | Freq: Every day | SUBCUTANEOUS | 11 refills | Status: DC

## 2020-07-06 MED ORDER — INSULIN REGULAR HUMAN 100 UNIT/ML IJ SOLN: 4.0000 [IU] | Freq: Three times a day (TID) | INTRAMUSCULAR | 11 refills | Status: DC

## 2020-07-06 MED ORDER — L-METHYLFOLATE-B6-B12 3-35-2 MG PO TABS: 1.0000 | ORAL_TABLET | Freq: Every day | ORAL | 0 refills | Status: DC

## 2020-07-06 NOTE — Discharge Summary (Signed)
Physician Discharge Summary  James Spurr, MD AVW:[id number] DOB: 1930/09/30 DOA: 07/01/2020  PCP: [name], MD  Admit date: 07/01/2020 Discharge date: 07/06/2020  Recommendations for Outpatient Follow-up:  1. Discharge to SNF for rehab 2. Follow up with PCP in 7-10 days after discharge from rehab. 3. Pt is to check glucose once in the am before breakfast and once in the evening before dinner. Record glucoses and take them in to PCP when he visits.  Discharge Diagnoses: Principal diagnosis is #1 1. 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 Fall 3. OSA on CPAP 4. Hyperlipidemia 5. DM II 6. CKD 3b 7. GERD 8. Chronic spastic hemipareis affecting dominant side 9. Essential hypertension  Discharge Condition: Fair  Disposition: SNF for rehab  Diet recommendation: Heart healthy with modified carbohydrates.  There were no vitals filed for this visit.  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  The patient was febrile on arrival to the emergency department, but he denies rhinorrhea, sore throat, productive cough, nausea, emesis, diarrhea, flank pain or dysuria.  However, he had his Pfizer mRNA vaccine third dose 2 days ago.  His appetite has been good, but he has been feeling weaker since earlier in the day.  He denies chest pain, palpitations, dizziness, diaphoresis, or recent lower extremity edema.  No abdominal pain,  constipation, melena hematochezia.  Denies polyuria, polyuria, polydipsia, polyphagia or blurred vision.  ED Course: Initial vital signs were temperature 100.4 F, pulse 84, respirations 20, blood pressure 119/72 mmHg O2 sat 97% on room air.  The patient received a 1000 mL NS bolus and a tablet of Norco 5-325 mg.  His urinalysis was unremarkable.  CBC showed a white count 7.8, hemoglobin 13.5 g/dL and platelets 143.  PT and INR were normal.  Lipase and lactic acid within normal limits.  SARS 2 and influenza PCR were negative.  CMP showed a sodium of 134 mmol/L, but all other values are within normal limits.  Glucose 139 and creatinine 1.38 mg/dL.  LFTs show an albumin of 3.3 g/dL, but all other values are within expected range.  Imaging: 1 view chest radiograph did not show any acute on normalities.  CT head/cervical spine/abdomen/pelvis did not show any acute normality.  Please see images and full regular report for further detail.  Hospital Course:  [facility] have been consulted to admit the patient for further evaluation and treatment.   The patient has been admitted to a telemetry bed. He has has no further fevers. No other source for fevers has been identified other than the COVID-19 vaccine.  He has been evaluated by PT/OT and their recommendation is for discharge to SNF. The patient is in agreement.  Today's assessment: S: The patient is sitting up on the side of the bed. He has no new complaints. O: Vitals:  Vitals:   07/05/20 2202 07/06/20 0532  BP: 128/63 123/66  Pulse: 68 65  Resp: 17 18  Temp: 97.9 F (36.6 C) 98.3 F (36.8 C)  SpO2: 96% 99%   Exam:  Constitutional:   The patient is awake, alert, and oriented x 3. No acute distress. Respiratory:   No increased work of breathing.  No wheezes, rales, or rhonchi  No tactile fremitus Cardiovascular:   Regular rate and rhythm  No murmurs, ectopy, or gallups.  No lateral PMI. No thrills. Abdomen:    Abdomen is soft, non-tender, non-distended  No hernias, masses, or organomegaly  Normoactive bowel sounds.  Musculoskeletal:   No cyanosis, clubbing, or edema Skin:   No rashes, lesions, ulcers  palpation of skin: no induration or nodules Neurologic:   CN 2-12 intact  Sensation all 4 extremities intact Psychiatric:   Mental status ? Mood, affect appropriate ? Orientation to person, place, time   judgment and insight appear intact  Discharge Instructions  Discharge Instructions    Activity as tolerated - No restrictions   Complete by: As directed    Call MD for:  difficulty breathing, headache or visual disturbances   Complete by: As directed    Call MD for:  persistant dizziness or light-headedness   Complete by: As directed    Call MD for:  severe uncontrolled pain   Complete by: As directed    Call MD for:  temperature >100.4   Complete by: As directed    Diet - low sodium heart healthy   Complete by: As directed    Discharge instructions   Complete by: As directed    Discharge to SNF for rehab Follow up with PCP in 7-10 days after discharge from rehab. Pt is to check glucose once in the am before breakfast and once in the evening before dinner. Record glucoses and take them in to PCP when he visits.   Increase activity slowly   Complete by: As directed      Allergies as of 07/06/2020      Reactions   Gabapentin Other (See Comments)   At high doses - BLURRED VISION, BALLANCE, CONCENTRATION PROBLEMS, RESTLESSNESS, NOT SLEEPING, ? LOST OF STRENGTH, UNSTEADINESS   Lisinopril Cough   tachycardia   Nexium [esomeprazole] Other (See Comments)   Headache   Niacin And Related Other (See Comments)   flushing      Medication List    STOP taking these medications   azelastine 0.1 % nasal spray Commonly known as: ASTELIN   BD Pen Needle Nano 2nd Gen 32G X 4 MM Misc Generic drug: Insulin Pen Needle   capsaicin 0.025 % cream Commonly known as: ZOSTRIX    diazepam 5 MG tablet Commonly known as: VALIUM   ferrous sulfate 325 (65 FE) MG tablet   Folbee 2.5-25-1 MG Tabs tablet Generic drug: Folic Acid-Vit E9-FYB O17   guaiFENesin-codeine 100-10 MG/5ML syrup Commonly known as: Cheratussin AC   Insulin Pen Needle 30G X 8 MM Misc Commonly known as: NovoFine   metroNIDAZOLE 0.75 % cream Commonly known as: METROCREAM   nystatin cream Commonly known as: MYCOSTATIN   Ozempic (0.25 or 0.5 MG/DOSE) 2 MG/1.5ML Sopn Generic drug: Semaglutide(0.25 or 0.5MG /DOS)   Tyler Aas FlexTouch 200 UNIT/ML FlexTouch Pen Generic drug: insulin degludec   valsartan 40 MG tablet Commonly known as: DIOVAN     TAKE these medications   Alpha-Lipoic Acid 600 MG Caps Take 1 capsule (600 mg total) by mouth daily.   atorvastatin 40 MG tablet Commonly known as: LIPITOR Take 40 mg by mouth daily.   celecoxib 200 MG capsule Commonly known as: CeleBREX Take 1 capsule (200 mg total) by mouth  daily.   clopidogrel 75 MG tablet Commonly known as: PLAVIX Take 1 tablet (75 mg total) by mouth daily.   COQ10 150 MG Caps Take 1 capsule by mouth 2 (two) times daily.   diclofenac Sodium 1 % Gel Commonly known as: VOLTAREN Apply 2 g topically 3 (three) times daily as needed. What changed: reasons to take this   DULoxetine 60 MG capsule Commonly known as: CYMBALTA TAKE 1 CAPSULE DAILY   famotidine 20 MG tablet Commonly known as: Pepcid Take 1 tablet (20 mg total) by mouth at bedtime.   Fish Oil 1000 MG Caps Take 1 capsule (1,000 mg total) by mouth in the morning and at bedtime.   gabapentin 300 MG capsule Commonly known as: NEURONTIN Take 2 capsules (600 mg total) by mouth 2 (two) times daily.   insulin glargine 100 UNIT/ML injection Commonly known as: LANTUS Inject 0.2 mLs (20 Units total) into the skin at bedtime.   insulin regular 100 units/mL injection Commonly known as: HumuLIN R Inject 0.04 mLs (4 Units total) into the skin 3 (three) times  daily before meals. What changed: how much to take   l-methylfolate-B6-B12 3-35-2 MG Tabs tablet Commonly known as: METANX Take 1 tablet by mouth daily. Start taking on: July 07, 2020   levothyroxine 50 MCG tablet Commonly known as: SYNTHROID Take 1 tablet (50 mcg total) by mouth daily before breakfast. Per endo [name]   Lumigan 0.01 % Soln Generic drug: bimatoprost Place 1 drop into the right eye at bedtime.   metoprolol tartrate 25 MG tablet Commonly known as: LOPRESSOR TAKE ONE-HALF (1/2) TABLET (12.5 MG) AT BEDTIME WITH MORNING DOSE IF NEEDED What changed: See the new instructions.   nitroGLYCERIN 0.4 MG SL tablet Commonly known as: Nitrostat DISSOLVE 1 TABLET UNDER THE TONGUE EVERY 5 MINUTES AS NEEDED FOR CHEST PAIN What changed:   how much to take  how to take this  when to take this  reasons to take this  additional instructions   ondansetron 4 MG tablet Commonly known as: Zofran Take 1 tablet (4 mg total) by mouth every 8 (eight) hours as needed for nausea or vomiting.   oxyCODONE 5 MG immediate release tablet Commonly known as: Oxy IR/ROXICODONE Take 1 tablet (5 mg total) by mouth 2 (two) times daily as needed. rls   pramipexole 1 MG tablet Commonly known as: MIRAPEX Take 1 tablet (1 mg total) by mouth at bedtime.   RABEprazole 20 MG tablet Commonly known as: ACIPHEX Take 1 tablet (20 mg total) by mouth daily.   rosuvastatin 40 MG tablet Commonly known as: Crestor Take 1 tablet (40 mg total) by mouth daily.   traZODone 50 MG tablet Commonly known as: DESYREL Take 50 mg by mouth at bedtime as needed for sleep.   vitamin C 500 MG tablet Commonly known as: ASCORBIC ACID Take 1,000 mg by mouth daily.      Allergies  Allergen Reactions  . Gabapentin Other (See Comments)    At high doses - BLURRED VISION, BALLANCE, CONCENTRATION PROBLEMS, RESTLESSNESS, NOT SLEEPING, ? LOST OF STRENGTH, UNSTEADINESS  . Lisinopril Cough    tachycardia  .  Nexium [Esomeprazole] Other (See Comments)    Headache  . Niacin And Related Other (See Comments)    flushing    The results of significant diagnostics from this hospitalization (including imaging, microbiology, ancillary and laboratory) are listed below for reference.    Significant Diagnostic Studies: CT Head Wo Contrast  Result Date: 07/01/2020 CLINICAL DATA:  86 year old male status post fall striking head. Pain. EXAM: CT HEAD WITHOUT CONTRAST TECHNIQUE: Contiguous axial images were obtained from the base of the skull through the vertex without intravenous contrast. COMPARISON:  Head CT 04/21/2020 and earlier. FINDINGS: Brain: Stable cerebral volume. Stable chronic lacunar infarct of the left corona radiata and lentiform. No midline shift, ventriculomegaly, mass effect, evidence of mass lesion, intracranial hemorrhage or evidence of cortically based acute infarction. Stable gray-white matter differentiation throughout the brain. Vascular: Calcified atherosclerosis at the skull base. No suspicious intracranial vascular hyperdensity. Skull: Stable.  No fracture identified. Sinuses/Orbits: Visualized paranasal sinuses and mastoids are stable and well pneumatized. Other: No orbit or scalp soft tissue injury identified. IMPRESSION: 1. No acute intracranial abnormality or acute traumatic injury identified. 2. Stable chronic small vessel ischemia in the left hemisphere. Electronically Signed   By: Genevie Ann M.D.   On: 07/01/2020 21:27   CT Cervical Spine Wo Contrast  Result Date: 07/01/2020 CLINICAL DATA:  86 year old male status post fall striking head. Pain. EXAM: CT CERVICAL SPINE WITHOUT CONTRAST TECHNIQUE: Multidetector CT imaging of the cervical spine was performed without intravenous contrast. Multiplanar CT image reconstructions were also generated. COMPARISON:  CT cervical spine 03/11/2012.  Head CT today. FINDINGS: Alignment: Cervical lordosis has not significantly changed since 2013, including  mild chronic degenerative anterolisthesis of C5 on C6. Cervicothoracic junction alignment is within normal limits. Bilateral posterior element alignment is within normal limits. Skull base and vertebrae: Visualized skull base is intact. No atlanto-occipital dissociation. No acute osseous abnormality identified. Soft tissues and spinal canal: No prevertebral fluid or swelling. No visible canal hematoma. Negative noncontrast visible neck soft tissues. Disc levels: Widespread right side cervical facet arthropathy. Flowing cervical endplate osteophytes resulting in interbody ankylosis at C6-C7. No significant cervical spinal stenosis suspected. Upper chest: Chronic upper thoracic spine degeneration. Visible upper thoracic levels appear intact. Mild mosaic attenuation in the upper lobes, probably gas trapping. Tortuous proximal great vessels in the visible superior mediastinum. IMPRESSION: 1. No acute traumatic injury identified in the cervical spine. 2. Cervical spine degeneration not significantly changed since 2013. Gas trapping suspected in the lung apices. Electronically Signed   By: Genevie Ann M.D.   On: 07/01/2020 21:30   CT ABDOMEN PELVIS W CONTRAST  Result Date: 07/01/2020 CLINICAL DATA:  Abdominal pain, fever. Weakness with history of falls. History of appendicitis/appendectomy. EXAM: CT ABDOMEN AND PELVIS WITH CONTRAST TECHNIQUE: Multidetector CT imaging of the abdomen and pelvis was performed using the standard protocol following bolus administration of intravenous contrast. CONTRAST:  22mL OMNIPAQUE IOHEXOL 300 MG/ML  SOLN COMPARISON:  Ultrasound renal 02/20/2014, CT abdomen pelvis 12/01/2010. FINDINGS: Lower chest: Bilateral lower lobe subsegmental atelectasis. At least mild three-vessel coronary artery calcifications. Hepatobiliary: No focal liver abnormality. No gallstones, gallbladder wall thickening, or pericholecystic fluid. No biliary dilatation. Pancreas: Diffusely atrophic. No focal lesion.  Otherwise normal pancreatic contour. No surrounding inflammatory changes. No main pancreatic ductal dilatation. Spleen: Normal in size without focal abnormality. Adrenals/Urinary Tract: No adrenal nodule bilaterally. Interval resolution of previous left subcapsular hematoma. Slightly decreased on the right and slightly increased on the left bilateral fluid density lesions likely represent simple renal cysts. Subcentimeter hypodensities are too small to characterize. Bilateral kidneys enhance symmetrically. No hydronephrosis. No hydroureter. The urinary bladder is unremarkable. Stomach/Bowel: Stomach is within normal limits. No evidence of bowel wall thickening or dilatation. Diffuse colonic diverticulosis. Status post appendectomy. Vascular/Lymphatic: Small I 8 interval increase in size of a peripherally calcified splenic artery aneurysm measuring up to  1.2 cm (from 1 cm). No abdominal aorta or iliac aneurysm. Moderate severe calcified and noncalcified atherosclerotic plaque of the aorta and its branches. No abdominal, pelvic, or inguinal lymphadenopathy. Reproductive: Prostate is unremarkable. Other: No intraperitoneal free fluid. No intraperitoneal free gas. No organized fluid collection. Musculoskeletal: Surgical changes likely related to a right inguinal hernia repair. No suspicious lytic or blastic osseous lesions. No acute displaced fracture. Multilevel degenerative changes of the spine. IMPRESSION: 1. No acute intra-abdominal or intrapelvic abnormality. 2. Diffuse colonic diverticulosis with no acute diverticulitis. 3. Slightly increased in size 1.2 cm peripherally calcified splenic artery aneurysm. 4.  Aortic Atherosclerosis (ICD10-I70.0). Electronically Signed   By: Iven Finn M.D.   On: 07/01/2020 22:48   DG Chest Portable 1 View  Result Date: 07/02/2020 CLINICAL DATA:  Fever, generalized weakness EXAM: PORTABLE CHEST 1 VIEW COMPARISON:  06/21/2019 FINDINGS: Lungs volumes are small, but are  symmetric and are clear. No pneumothorax or pleural effusion. Cardiac size within normal limits. Pulmonary vascularity is normal. Osseous structures are age-appropriate. No acute bone abnormality. IMPRESSION: No active disease. Electronically Signed   By: Fidela Salisbury MD   On: 07/02/2020 00:36   DG Shoulder Left  Result Date: 06/17/2020 CLINICAL DATA:  Left shoulder pain.  No known injury. EXAM: LEFT SHOULDER - 2+ VIEW COMPARISON:  None. FINDINGS: There is no acute bony or joint abnormality. The patient has moderate acromioclavicular osteoarthritis. The glenohumeral joint appears normal. No focal bony lesion. Imaged lung parenchyma is clear. IMPRESSION: Moderate acromioclavicular osteoarthritis.  Otherwise negative. Electronically Signed   By: Inge Rise M.D.   On: 06/17/2020 15:58   DG HIP UNILAT W OR W/O PELVIS 2-3 VIEWS LEFT  Result Date: 06/17/2020 CLINICAL DATA:  Left hip pain. EXAM: DG HIP (WITH OR WITHOUT PELVIS) 2-3V LEFT COMPARISON:  None. FINDINGS: There is no evidence of hip fracture or dislocation. There is no evidence of arthropathy or other focal bone abnormality. IMPRESSION: Negative exam. Electronically Signed   By: Inge Rise M.D.   On: 06/17/2020 15:59    Microbiology: Recent Results (from the past 240 hour(s))  Blood culture (routine x 2)     Status: None (Preliminary result)   Collection Time: 07/01/20 11:16 PM   Specimen: BLOOD RIGHT HAND  Result Value Ref Range Status   Specimen Description BLOOD RIGHT HAND  Final   Special Requests   Final    BOTTLES DRAWN AEROBIC AND ANAEROBIC Blood Culture adequate volume   Culture   Final    NO GROWTH 4 DAYS Performed at [facility], [place] [address], [place], [place] [address]    Report Status PENDING  Incomplete  Blood culture (routine x 2)     Status: None (Preliminary result)   Collection Time: 07/01/20 11:20 PM   Specimen: BLOOD LEFT HAND  Result Value Ref Range Status   Specimen Description BLOOD LEFT  HAND  Final   Special Requests   Final    BOTTLES DRAWN AEROBIC AND ANAEROBIC Blood Culture adequate volume   Culture   Final    NO GROWTH 4 DAYS Performed at [facility], [place] [address] [place], [place] [address]    Report Status PENDING  Incomplete  Respiratory Panel by RT PCR (Flu A&B, Covid) - Nasopharyngeal Swab     Status: None   Collection Time: 07/01/20 11:47 PM   Specimen: Nasopharyngeal Swab  Result Value Ref Range Status   SARS Coronavirus 2 by RT PCR NEGATIVE NEGATIVE Final  Comment: (NOTE) SARS-CoV-2 target nucleic acids are NOT DETECTED.  The SARS-CoV-2 RNA is generally detectable in upper respiratoy specimens during the acute phase of infection. The lowest concentration of SARS-CoV-2 viral copies this assay can detect is 131 copies/mL. A negative result does not preclude SARS-Cov-2 infection and should not be used as the sole basis for treatment or other patient management decisions. A negative result may occur with  improper specimen collection/handling, submission of specimen other than nasopharyngeal swab, presence of viral mutation(s) within the areas targeted by this assay, and inadequate number of viral copies (<131 copies/mL). A negative result must be combined with clinical observations, patient history, and epidemiological information. The expected result is Negative.  Fact Sheet for Patients:  PinkCheek.be  Fact Sheet for Healthcare Providers:  GravelBags.it  This test is no t yet approved or cleared by the [place] FDA and  has been authorized for detection and/or diagnosis of SARS-CoV-2 by FDA under an Emergency Use Authorization (EUA). This EUA will remain  in effect (meaning this test can be used) for the duration of the COVID-19 declaration under Section 564(b)(1) of the Act, 21 U.S.C. section 360bbb-3(b)(1), unless the authorization is terminated or revoked sooner.      Influenza A by PCR NEGATIVE NEGATIVE Final   Influenza B by PCR NEGATIVE NEGATIVE Final    Comment: (NOTE) The Xpert Xpress SARS-CoV-2/FLU/RSV assay is intended as an aid in  the diagnosis of influenza from Nasopharyngeal swab specimens and  should not be used as a sole basis for treatment. Nasal washings and  aspirates are unacceptable for Xpert Xpress SARS-CoV-2/FLU/RSV  testing.  Fact Sheet for Patients: PinkCheek.be  Fact Sheet for Healthcare Providers: GravelBags.it  This test is not yet approved or cleared by the [place] FDA and  has been authorized for detection and/or diagnosis of SARS-CoV-2 by  FDA under an Emergency Use Authorization (EUA). This EUA will remain  in effect (meaning this test can be used) for the duration of the  Covid-19 declaration under Section 564(b)(1) of the Act, 21  U.S.C. section 360bbb-3(b)(1), unless the authorization is  terminated or revoked. Performed at [facility], [place] [address] [place], [place] [address]   SARS Coronavirus 2 by RT PCR (hospital order, performed in [facility] hospital lab) Nasopharyngeal Nasopharyngeal Swab     Status: None   Collection Time: 07/06/20 11:15 AM   Specimen: Nasopharyngeal Swab  Result Value Ref Range Status   SARS Coronavirus 2 NEGATIVE NEGATIVE Final    Comment: (NOTE) SARS-CoV-2 target nucleic acids are NOT DETECTED.  The SARS-CoV-2 RNA is generally detectable in upper and lower respiratory specimens during the acute phase of infection. The lowest concentration of SARS-CoV-2 viral copies this assay can detect is 250 copies / mL. A negative result does not preclude SARS-CoV-2 infection and should not be used as the sole basis for treatment or other patient management decisions.  A negative result may occur with improper specimen collection / handling, submission of specimen other than nasopharyngeal swab, presence of viral  mutation(s) within the areas targeted by this assay, and inadequate number of viral copies (<250 copies / mL). A negative result must be combined with clinical observations, patient history, and epidemiological information.  Fact Sheet for Patients:   StrictlyIdeas.no  Fact Sheet for Healthcare Providers: BankingDealers.co.za  This test is not yet approved or  cleared by the [place] FDA and has been authorized for detection and/or diagnosis of SARS-CoV-2 by FDA under an Emergency Use  Authorization (EUA).  This EUA will remain in effect (meaning this test can be used) for the duration of the COVID-19 declaration under Section 564(b)(1) of the Act, 21 U.S.C. section 360bbb-3(b)(1), unless the authorization is terminated or revoked sooner.  Performed at [facility], [place] [address], [place], [place] [address]      Labs: Basic Metabolic Panel: Recent Labs  Lab 07/01/20 2036 07/02/20 1025 07/04/20 0146  NA 134* 136 138  K 4.3 4.2 3.9  CL 100 102 104  CO2 25 25 24   GLUCOSE 139* 164* 98  BUN 20 17 22   CREATININE 1.38* 1.45* 1.61*  CALCIUM 8.9 8.5* 8.4*   Liver Function Tests: Recent Labs  Lab 07/01/20 2036  AST 24  ALT 18  ALKPHOS 40  BILITOT 0.7  PROT 6.6  ALBUMIN 3.3*   Recent Labs  Lab 07/01/20 2036  LIPASE 31   No results for input(s): AMMONIA in the last 168 hours. CBC: Recent Labs  Lab 07/01/20 2036 07/02/20 0412  WBC 7.8 6.9  NEUTROABS 6.2 5.1  HGB 13.5 13.8  HCT 42.0 43.1  MCV 89.9 90.9  PLT 143* 127*   Cardiac Enzymes: No results for input(s): CKTOTAL, CKMB, CKMBINDEX, TROPONINI in the last 168 hours. BNP: BNP (last 3 results) No results for input(s): BNP in the last 8760 hours.  ProBNP (last 3 results) No results for input(s): PROBNP in the last 8760 hours.  CBG: Recent Labs  Lab 07/05/20 1147 07/05/20 1632 07/05/20 2200 07/06/20 0742 07/06/20 1232  GLUCAP 148* 94 158*  142* 96    Principal Problem:   Fever Active Problems:   OSA on CPAP   Hyperlipidemia associated with type 2 diabetes mellitus (HCC)   GERD (gastroesophageal reflux disease)   Spastic hemiparesis affecting dominant side (HCC)   HTN (hypertension)   Type 2 diabetes, controlled, with neuropathy (HCC)   Thrombocytopenia (HCC)   Hyponatremia   Fall   Time coordinating discharge: 38 minutes.  Signed:        Angely Dietz, DO [facility]  07/06/2020, 1:34 PM

## 2020-07-06 NOTE — Progress Notes (Signed)
James Spurr, MD to be D/C'd per MD order. Discussed with the patient and all questions fully answered. ? VSS, Skin clean, dry and intact without evidence of skin break down, no evidence of skin tears noted. ? IV catheter discontinued intact. Site without signs and symptoms of complications. Dressing and pressure applied. ? An After Visit Summary was printed and placed in patient discharge packet.  ? D/C education completed with receiving facility including follow up instructions, medication list, d/c activities limitations if indicated, with other d/c instructions as indicated by MD.    ? Patient to be escorted via stretcher, and D/C home via [facility].

## 2020-07-06 NOTE — Progress Notes (Signed)
Auth approved for [employer]. TMHD#[id number] Ref# [id number] Care Coordinator Clement Sayres. Approved for 3days 10/19 -- 07/08/20.

## 2020-07-06 NOTE — TOC Transition Note (Signed)
Transition of Care [facility]) - CM/SW Discharge Note   Patient Details  Name: James RODWELL, MD MRN: [id number] Date of Birth: Sep 23, 1930  [facility] CM/SW Contact:  Bethann Berkshire, [place] Phone Number: 07/06/2020, 1:54 PM   Clinical Narrative:     Patient will DC to:  [place] date:07/06/20 Family notified:  [name] Daughter [phone]    Transport by: Corey Harold   Per MD patient ready for DC to [facility]. RN, patient, patient's family, and facility notified of DC. Discharge Summary and FL2 sent to facility. RN to call report prior to discharge [phone] DC packet on chart. Ambulance transport requested for patient.   CSW will sign off for now as social work intervention is no longer needed. Please consult [name] again if new needs arise.     Barriers to Discharge: Continued Medical Work up, Ship broker   Patient Goals and CMS Choice Patient states their goals for this hospitalization and ongoing recovery are:: to return home CMS Medicare.gov Compare Post Acute Care list provided to:: Patient Choice offered to / list presented to : Patient  Discharge Placement                       Discharge Plan and Services                                     Social Determinants of Health (SDOH) Interventions     Readmission Risk Interventions No flowsheet data found.

## 2020-07-07 LAB — CULTURE, BLOOD (ROUTINE X 2)
Culture: NO GROWTH
Culture: NO GROWTH
Special Requests: ADEQUATE
Special Requests: ADEQUATE

## 2020-07-08 DIAGNOSIS — T50B95A Adverse effect of other viral vaccines, initial encounter: Secondary | ICD-10-CM | POA: Diagnosis not present

## 2020-07-08 DIAGNOSIS — R531 Weakness: Secondary | ICD-10-CM | POA: Diagnosis not present

## 2020-07-08 DIAGNOSIS — G4733 Obstructive sleep apnea (adult) (pediatric): Secondary | ICD-10-CM | POA: Diagnosis not present

## 2020-07-08 DIAGNOSIS — I69951 Hemiplegia and hemiparesis following unspecified cerebrovascular disease affecting right dominant side: Secondary | ICD-10-CM | POA: Diagnosis not present

## 2020-07-08 DIAGNOSIS — I639 Cerebral infarction, unspecified: Secondary | ICD-10-CM | POA: Diagnosis not present

## 2020-07-13 DIAGNOSIS — I639 Cerebral infarction, unspecified: Secondary | ICD-10-CM | POA: Diagnosis not present

## 2020-07-13 DIAGNOSIS — I69951 Hemiplegia and hemiparesis following unspecified cerebrovascular disease affecting right dominant side: Secondary | ICD-10-CM | POA: Diagnosis not present

## 2020-07-13 DIAGNOSIS — E1140 Type 2 diabetes mellitus with diabetic neuropathy, unspecified: Secondary | ICD-10-CM | POA: Diagnosis not present

## 2020-07-13 DIAGNOSIS — E11649 Type 2 diabetes mellitus with hypoglycemia without coma: Secondary | ICD-10-CM | POA: Diagnosis not present

## 2020-07-14 DIAGNOSIS — I639 Cerebral infarction, unspecified: Secondary | ICD-10-CM | POA: Diagnosis not present

## 2020-07-14 DIAGNOSIS — R5381 Other malaise: Secondary | ICD-10-CM | POA: Diagnosis not present

## 2020-07-14 DIAGNOSIS — I69951 Hemiplegia and hemiparesis following unspecified cerebrovascular disease affecting right dominant side: Secondary | ICD-10-CM | POA: Diagnosis not present

## 2020-07-16 DIAGNOSIS — R260 Ataxic gait: Secondary | ICD-10-CM | POA: Diagnosis not present

## 2020-07-16 DIAGNOSIS — Z794 Long term (current) use of insulin: Secondary | ICD-10-CM | POA: Diagnosis not present

## 2020-07-16 DIAGNOSIS — C44311 Basal cell carcinoma of skin of nose: Secondary | ICD-10-CM | POA: Diagnosis not present

## 2020-07-16 DIAGNOSIS — E785 Hyperlipidemia, unspecified: Secondary | ICD-10-CM | POA: Diagnosis not present

## 2020-07-16 DIAGNOSIS — T50B95D Adverse effect of other viral vaccines, subsequent encounter: Secondary | ICD-10-CM | POA: Diagnosis not present

## 2020-07-16 DIAGNOSIS — E1140 Type 2 diabetes mellitus with diabetic neuropathy, unspecified: Secondary | ICD-10-CM | POA: Diagnosis not present

## 2020-07-16 DIAGNOSIS — W19XXXD Unspecified fall, subsequent encounter: Secondary | ICD-10-CM | POA: Diagnosis not present

## 2020-07-16 DIAGNOSIS — G4733 Obstructive sleep apnea (adult) (pediatric): Secondary | ICD-10-CM | POA: Diagnosis not present

## 2020-07-16 DIAGNOSIS — D631 Anemia in chronic kidney disease: Secondary | ICD-10-CM | POA: Diagnosis not present

## 2020-07-16 DIAGNOSIS — K219 Gastro-esophageal reflux disease without esophagitis: Secondary | ICD-10-CM | POA: Diagnosis not present

## 2020-07-16 DIAGNOSIS — I69351 Hemiplegia and hemiparesis following cerebral infarction affecting right dominant side: Secondary | ICD-10-CM | POA: Diagnosis not present

## 2020-07-16 DIAGNOSIS — M6281 Muscle weakness (generalized): Secondary | ICD-10-CM | POA: Diagnosis not present

## 2020-07-16 DIAGNOSIS — Z8673 Personal history of transient ischemic attack (TIA), and cerebral infarction without residual deficits: Secondary | ICD-10-CM | POA: Diagnosis not present

## 2020-07-16 DIAGNOSIS — E1122 Type 2 diabetes mellitus with diabetic chronic kidney disease: Secondary | ICD-10-CM | POA: Diagnosis not present

## 2020-07-16 DIAGNOSIS — I129 Hypertensive chronic kidney disease with stage 1 through stage 4 chronic kidney disease, or unspecified chronic kidney disease: Secondary | ICD-10-CM | POA: Diagnosis not present

## 2020-07-16 DIAGNOSIS — E871 Hypo-osmolality and hyponatremia: Secondary | ICD-10-CM | POA: Diagnosis not present

## 2020-07-20 DIAGNOSIS — H40001 Preglaucoma, unspecified, right eye: Secondary | ICD-10-CM | POA: Diagnosis not present

## 2020-07-21 DIAGNOSIS — Z7902 Long term (current) use of antithrombotics/antiplatelets: Secondary | ICD-10-CM | POA: Diagnosis not present

## 2020-07-21 DIAGNOSIS — D631 Anemia in chronic kidney disease: Secondary | ICD-10-CM | POA: Diagnosis not present

## 2020-07-21 DIAGNOSIS — W19XXXD Unspecified fall, subsequent encounter: Secondary | ICD-10-CM | POA: Diagnosis not present

## 2020-07-21 DIAGNOSIS — I69351 Hemiplegia and hemiparesis following cerebral infarction affecting right dominant side: Secondary | ICD-10-CM | POA: Diagnosis not present

## 2020-07-21 DIAGNOSIS — C44311 Basal cell carcinoma of skin of nose: Secondary | ICD-10-CM | POA: Diagnosis not present

## 2020-07-21 DIAGNOSIS — Z794 Long term (current) use of insulin: Secondary | ICD-10-CM | POA: Diagnosis not present

## 2020-07-21 DIAGNOSIS — Z79891 Long term (current) use of opiate analgesic: Secondary | ICD-10-CM | POA: Diagnosis not present

## 2020-07-21 DIAGNOSIS — E1140 Type 2 diabetes mellitus with diabetic neuropathy, unspecified: Secondary | ICD-10-CM | POA: Diagnosis not present

## 2020-07-21 DIAGNOSIS — N1832 Chronic kidney disease, stage 3b: Secondary | ICD-10-CM | POA: Diagnosis not present

## 2020-07-21 DIAGNOSIS — M6281 Muscle weakness (generalized): Secondary | ICD-10-CM | POA: Diagnosis not present

## 2020-07-21 DIAGNOSIS — G4733 Obstructive sleep apnea (adult) (pediatric): Secondary | ICD-10-CM | POA: Diagnosis not present

## 2020-07-21 DIAGNOSIS — E1122 Type 2 diabetes mellitus with diabetic chronic kidney disease: Secondary | ICD-10-CM | POA: Diagnosis not present

## 2020-07-21 DIAGNOSIS — K219 Gastro-esophageal reflux disease without esophagitis: Secondary | ICD-10-CM | POA: Diagnosis not present

## 2020-07-21 DIAGNOSIS — E785 Hyperlipidemia, unspecified: Secondary | ICD-10-CM | POA: Diagnosis not present

## 2020-07-21 DIAGNOSIS — I129 Hypertensive chronic kidney disease with stage 1 through stage 4 chronic kidney disease, or unspecified chronic kidney disease: Secondary | ICD-10-CM | POA: Diagnosis not present

## 2020-07-21 DIAGNOSIS — T50B95D Adverse effect of other viral vaccines, subsequent encounter: Secondary | ICD-10-CM | POA: Diagnosis not present

## 2020-07-21 DIAGNOSIS — E871 Hypo-osmolality and hyponatremia: Secondary | ICD-10-CM | POA: Diagnosis not present

## 2020-07-23 ENCOUNTER — Telehealth: Payer: Self-pay | Admitting: *Deleted

## 2020-07-23 DIAGNOSIS — Z7902 Long term (current) use of antithrombotics/antiplatelets: Secondary | ICD-10-CM

## 2020-07-23 DIAGNOSIS — K219 Gastro-esophageal reflux disease without esophagitis: Secondary | ICD-10-CM

## 2020-07-23 DIAGNOSIS — E1140 Type 2 diabetes mellitus with diabetic neuropathy, unspecified: Secondary | ICD-10-CM | POA: Diagnosis not present

## 2020-07-23 DIAGNOSIS — T50B95D Adverse effect of other viral vaccines, subsequent encounter: Secondary | ICD-10-CM | POA: Diagnosis not present

## 2020-07-23 DIAGNOSIS — N1832 Chronic kidney disease, stage 3b: Secondary | ICD-10-CM

## 2020-07-23 DIAGNOSIS — Z79891 Long term (current) use of opiate analgesic: Secondary | ICD-10-CM

## 2020-07-23 DIAGNOSIS — N400 Enlarged prostate without lower urinary tract symptoms: Secondary | ICD-10-CM

## 2020-07-23 DIAGNOSIS — D631 Anemia in chronic kidney disease: Secondary | ICD-10-CM

## 2020-07-23 DIAGNOSIS — Z794 Long term (current) use of insulin: Secondary | ICD-10-CM

## 2020-07-23 DIAGNOSIS — G4733 Obstructive sleep apnea (adult) (pediatric): Secondary | ICD-10-CM

## 2020-07-23 DIAGNOSIS — C44311 Basal cell carcinoma of skin of nose: Secondary | ICD-10-CM | POA: Diagnosis not present

## 2020-07-23 DIAGNOSIS — E785 Hyperlipidemia, unspecified: Secondary | ICD-10-CM

## 2020-07-23 DIAGNOSIS — M6281 Muscle weakness (generalized): Secondary | ICD-10-CM | POA: Diagnosis not present

## 2020-07-23 DIAGNOSIS — W19XXXD Unspecified fall, subsequent encounter: Secondary | ICD-10-CM

## 2020-07-23 DIAGNOSIS — I69351 Hemiplegia and hemiparesis following cerebral infarction affecting right dominant side: Secondary | ICD-10-CM | POA: Diagnosis not present

## 2020-07-23 DIAGNOSIS — E1122 Type 2 diabetes mellitus with diabetic chronic kidney disease: Secondary | ICD-10-CM

## 2020-07-23 DIAGNOSIS — E871 Hypo-osmolality and hyponatremia: Secondary | ICD-10-CM

## 2020-07-23 DIAGNOSIS — I129 Hypertensive chronic kidney disease with stage 1 through stage 4 chronic kidney disease, or unspecified chronic kidney disease: Secondary | ICD-10-CM

## 2020-07-23 DIAGNOSIS — F329 Major depressive disorder, single episode, unspecified: Secondary | ICD-10-CM

## 2020-07-23 NOTE — Telephone Encounter (Signed)
Noted  

## 2020-07-23 NOTE — Telephone Encounter (Signed)
James Bell PT with [facility] left a voicemail wanting to let the doctor know that they missed a visit today because they were not able to get in touch with the patient until too last yesterday to schedule him today.  This is a Pharmacist, hospital. James Bell requested a call back to confirm that this message was received. Left a message for James Bell that the message has been received and sent to the doctor.

## 2020-07-26 DIAGNOSIS — I129 Hypertensive chronic kidney disease with stage 1 through stage 4 chronic kidney disease, or unspecified chronic kidney disease: Secondary | ICD-10-CM | POA: Diagnosis not present

## 2020-07-26 DIAGNOSIS — M6281 Muscle weakness (generalized): Secondary | ICD-10-CM | POA: Diagnosis not present

## 2020-07-26 DIAGNOSIS — Z794 Long term (current) use of insulin: Secondary | ICD-10-CM | POA: Diagnosis not present

## 2020-07-26 DIAGNOSIS — Z7902 Long term (current) use of antithrombotics/antiplatelets: Secondary | ICD-10-CM | POA: Diagnosis not present

## 2020-07-26 DIAGNOSIS — I69351 Hemiplegia and hemiparesis following cerebral infarction affecting right dominant side: Secondary | ICD-10-CM | POA: Diagnosis not present

## 2020-07-26 DIAGNOSIS — N1832 Chronic kidney disease, stage 3b: Secondary | ICD-10-CM | POA: Diagnosis not present

## 2020-07-26 DIAGNOSIS — Z79891 Long term (current) use of opiate analgesic: Secondary | ICD-10-CM | POA: Diagnosis not present

## 2020-07-26 DIAGNOSIS — D631 Anemia in chronic kidney disease: Secondary | ICD-10-CM | POA: Diagnosis not present

## 2020-07-26 DIAGNOSIS — T50B95D Adverse effect of other viral vaccines, subsequent encounter: Secondary | ICD-10-CM | POA: Diagnosis not present

## 2020-07-26 DIAGNOSIS — G4733 Obstructive sleep apnea (adult) (pediatric): Secondary | ICD-10-CM | POA: Diagnosis not present

## 2020-07-26 DIAGNOSIS — E785 Hyperlipidemia, unspecified: Secondary | ICD-10-CM | POA: Diagnosis not present

## 2020-07-26 DIAGNOSIS — W19XXXD Unspecified fall, subsequent encounter: Secondary | ICD-10-CM | POA: Diagnosis not present

## 2020-07-26 DIAGNOSIS — E1140 Type 2 diabetes mellitus with diabetic neuropathy, unspecified: Secondary | ICD-10-CM | POA: Diagnosis not present

## 2020-07-26 DIAGNOSIS — C44311 Basal cell carcinoma of skin of nose: Secondary | ICD-10-CM | POA: Diagnosis not present

## 2020-07-26 DIAGNOSIS — E871 Hypo-osmolality and hyponatremia: Secondary | ICD-10-CM | POA: Diagnosis not present

## 2020-07-26 DIAGNOSIS — E1122 Type 2 diabetes mellitus with diabetic chronic kidney disease: Secondary | ICD-10-CM | POA: Diagnosis not present

## 2020-07-26 DIAGNOSIS — K219 Gastro-esophageal reflux disease without esophagitis: Secondary | ICD-10-CM | POA: Diagnosis not present

## 2020-07-28 DIAGNOSIS — W19XXXD Unspecified fall, subsequent encounter: Secondary | ICD-10-CM | POA: Diagnosis not present

## 2020-07-28 DIAGNOSIS — D631 Anemia in chronic kidney disease: Secondary | ICD-10-CM | POA: Diagnosis not present

## 2020-07-28 DIAGNOSIS — Z7902 Long term (current) use of antithrombotics/antiplatelets: Secondary | ICD-10-CM | POA: Diagnosis not present

## 2020-07-28 DIAGNOSIS — K219 Gastro-esophageal reflux disease without esophagitis: Secondary | ICD-10-CM | POA: Diagnosis not present

## 2020-07-28 DIAGNOSIS — I69351 Hemiplegia and hemiparesis following cerebral infarction affecting right dominant side: Secondary | ICD-10-CM | POA: Diagnosis not present

## 2020-07-28 DIAGNOSIS — E785 Hyperlipidemia, unspecified: Secondary | ICD-10-CM | POA: Diagnosis not present

## 2020-07-28 DIAGNOSIS — Z794 Long term (current) use of insulin: Secondary | ICD-10-CM | POA: Diagnosis not present

## 2020-07-28 DIAGNOSIS — N1832 Chronic kidney disease, stage 3b: Secondary | ICD-10-CM | POA: Diagnosis not present

## 2020-07-28 DIAGNOSIS — E871 Hypo-osmolality and hyponatremia: Secondary | ICD-10-CM | POA: Diagnosis not present

## 2020-07-28 DIAGNOSIS — C44311 Basal cell carcinoma of skin of nose: Secondary | ICD-10-CM | POA: Diagnosis not present

## 2020-07-28 DIAGNOSIS — M6281 Muscle weakness (generalized): Secondary | ICD-10-CM | POA: Diagnosis not present

## 2020-07-28 DIAGNOSIS — T50B95D Adverse effect of other viral vaccines, subsequent encounter: Secondary | ICD-10-CM | POA: Diagnosis not present

## 2020-07-28 DIAGNOSIS — G4733 Obstructive sleep apnea (adult) (pediatric): Secondary | ICD-10-CM | POA: Diagnosis not present

## 2020-07-28 DIAGNOSIS — Z79891 Long term (current) use of opiate analgesic: Secondary | ICD-10-CM | POA: Diagnosis not present

## 2020-07-28 DIAGNOSIS — E1122 Type 2 diabetes mellitus with diabetic chronic kidney disease: Secondary | ICD-10-CM | POA: Diagnosis not present

## 2020-07-28 DIAGNOSIS — I129 Hypertensive chronic kidney disease with stage 1 through stage 4 chronic kidney disease, or unspecified chronic kidney disease: Secondary | ICD-10-CM | POA: Diagnosis not present

## 2020-07-28 DIAGNOSIS — E1140 Type 2 diabetes mellitus with diabetic neuropathy, unspecified: Secondary | ICD-10-CM | POA: Diagnosis not present

## 2020-08-02 ENCOUNTER — Other Ambulatory Visit: Payer: Self-pay | Admitting: Family Medicine

## 2020-08-02 DIAGNOSIS — N1832 Chronic kidney disease, stage 3b: Secondary | ICD-10-CM | POA: Diagnosis not present

## 2020-08-02 DIAGNOSIS — E785 Hyperlipidemia, unspecified: Secondary | ICD-10-CM | POA: Diagnosis not present

## 2020-08-02 DIAGNOSIS — K219 Gastro-esophageal reflux disease without esophagitis: Secondary | ICD-10-CM | POA: Diagnosis not present

## 2020-08-02 DIAGNOSIS — M6281 Muscle weakness (generalized): Secondary | ICD-10-CM | POA: Diagnosis not present

## 2020-08-02 DIAGNOSIS — E1122 Type 2 diabetes mellitus with diabetic chronic kidney disease: Secondary | ICD-10-CM | POA: Diagnosis not present

## 2020-08-02 DIAGNOSIS — W19XXXD Unspecified fall, subsequent encounter: Secondary | ICD-10-CM | POA: Diagnosis not present

## 2020-08-02 DIAGNOSIS — I69351 Hemiplegia and hemiparesis following cerebral infarction affecting right dominant side: Secondary | ICD-10-CM | POA: Diagnosis not present

## 2020-08-02 DIAGNOSIS — D631 Anemia in chronic kidney disease: Secondary | ICD-10-CM | POA: Diagnosis not present

## 2020-08-02 DIAGNOSIS — I129 Hypertensive chronic kidney disease with stage 1 through stage 4 chronic kidney disease, or unspecified chronic kidney disease: Secondary | ICD-10-CM | POA: Diagnosis not present

## 2020-08-02 DIAGNOSIS — T50B95D Adverse effect of other viral vaccines, subsequent encounter: Secondary | ICD-10-CM | POA: Diagnosis not present

## 2020-08-02 DIAGNOSIS — E1140 Type 2 diabetes mellitus with diabetic neuropathy, unspecified: Secondary | ICD-10-CM | POA: Diagnosis not present

## 2020-08-02 DIAGNOSIS — Z794 Long term (current) use of insulin: Secondary | ICD-10-CM | POA: Diagnosis not present

## 2020-08-02 DIAGNOSIS — E871 Hypo-osmolality and hyponatremia: Secondary | ICD-10-CM | POA: Diagnosis not present

## 2020-08-02 DIAGNOSIS — C44311 Basal cell carcinoma of skin of nose: Secondary | ICD-10-CM | POA: Diagnosis not present

## 2020-08-02 DIAGNOSIS — G4733 Obstructive sleep apnea (adult) (pediatric): Secondary | ICD-10-CM | POA: Diagnosis not present

## 2020-08-02 DIAGNOSIS — Z79891 Long term (current) use of opiate analgesic: Secondary | ICD-10-CM | POA: Diagnosis not present

## 2020-08-02 DIAGNOSIS — Z7902 Long term (current) use of antithrombotics/antiplatelets: Secondary | ICD-10-CM | POA: Diagnosis not present

## 2020-08-03 ENCOUNTER — Ambulatory Visit (INDEPENDENT_AMBULATORY_CARE_PROVIDER_SITE_OTHER): Payer: Medicare Other | Admitting: Family Medicine

## 2020-08-03 ENCOUNTER — Other Ambulatory Visit: Payer: Self-pay

## 2020-08-03 ENCOUNTER — Encounter: Payer: Self-pay | Admitting: Family Medicine

## 2020-08-03 VITALS — BP 134/60 | HR 71 | Temp 97.9°F | Ht 68.0 in | Wt 187.1 lb

## 2020-08-03 DIAGNOSIS — I69351 Hemiplegia and hemiparesis following cerebral infarction affecting right dominant side: Secondary | ICD-10-CM

## 2020-08-03 DIAGNOSIS — G2581 Restless legs syndrome: Secondary | ICD-10-CM

## 2020-08-03 DIAGNOSIS — K219 Gastro-esophageal reflux disease without esophagitis: Secondary | ICD-10-CM | POA: Diagnosis not present

## 2020-08-03 DIAGNOSIS — R5083 Postvaccination fever: Secondary | ICD-10-CM | POA: Diagnosis not present

## 2020-08-03 DIAGNOSIS — IMO0002 Reserved for concepts with insufficient information to code with codable children: Secondary | ICD-10-CM

## 2020-08-03 DIAGNOSIS — I10 Essential (primary) hypertension: Secondary | ICD-10-CM

## 2020-08-03 DIAGNOSIS — E1140 Type 2 diabetes mellitus with diabetic neuropathy, unspecified: Secondary | ICD-10-CM

## 2020-08-03 DIAGNOSIS — G8110 Spastic hemiplegia affecting unspecified side: Secondary | ICD-10-CM

## 2020-08-03 DIAGNOSIS — E1165 Type 2 diabetes mellitus with hyperglycemia: Secondary | ICD-10-CM

## 2020-08-03 MED ORDER — ONDANSETRON HCL 4 MG PO TABS: 4.0000 mg | ORAL_TABLET | Freq: Three times a day (TID) | ORAL | 0 refills | Status: DC | PRN

## 2020-08-03 NOTE — Assessment & Plan Note (Signed)
Diabetic regimen was changed during hospitalization - from tresiba 90u daily to lantus 20u daily, unclear why the drop. ozempic was also held. Patient has restarted previous home regimen of tresiba and wants to restart ozempic - advised he contact [name] endocrinology for guidance on antihyperglycemic regimen.

## 2020-08-03 NOTE — Assessment & Plan Note (Signed)
Post-COVID booster fever. Infectious workup was normal.

## 2020-08-03 NOTE — Assessment & Plan Note (Signed)
Reviewed regimen which is aciphex 20mg  daily with pepcid at night.

## 2020-08-03 NOTE — Patient Instructions (Addendum)
Check with Dr Jeraldine Loots about need for oral iron.  Zofran refilled today.  Touch base with endocrinology about plans for antihyperglycemics. Continue previous regimen.

## 2020-08-03 NOTE — Assessment & Plan Note (Signed)
Chronic, stable. Continue metoprolol and valsartan (restarted)

## 2020-08-03 NOTE — Assessment & Plan Note (Signed)
Continues seeing [facility] neurologist Dr Jeraldine Loots - reviewed current regimen.

## 2020-08-03 NOTE — Progress Notes (Signed)
Patient ID: James Spurr, MD, male    DOB: 04/29/1931, 86 y.o.   MRN: [id number]  This visit was conducted in person.  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 Also had fever attributed to recent [place] booster 06/29/2020. Tested negative for flu and COVID-19. Blcx neg x2. S/p PT/OT eval - rec discharge to SNF for rehab.  Poor experience at rehab, came home 1 wk later. HHPT now coming home twice weekly.   Multiple medications were held upon discharge including astelin nasal spray, capsaicin, diazepam, oral ferrous sulfate, folbee vitamin, codeine cough syrup, metronidazole cream, nystatin cream, ozempic, tresiba and valsartan.   Home BP running well controlled. He actually restarted valsartan at home.   He will call endo to follow up on diabetic medications. He restarted tresiba and is planning to restart ozempic.   RLS - seeing [facility] neurologist Dr Jeraldine Loots for this - managing with gabapentin 600mg  bid and 1 oxycodone 5mg  daily. He has also restarted ferrous sulfate. He continues mirapex 1mg  and cymbalta 60mg  daily. Overall stable period.    Admit date: 07/01/2020 Discharge date: 07/06/2020 SNF Rehab in [place], came home 1 wk later.  TCM hosp f/u phone call not completed.   Recommendations for Outpatient Follow-up:  1. Discharge to SNF for rehab 2. Follow up with PCP in 7-10 days after discharge from rehab. 3. Pt is to check glucose once in the am before breakfast and once in the evening before dinner. Record glucoses and take them in to PCP when he visits.  Discharge Diagnoses: Principal diagnosis is  #1 1. 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 Fall 3. OSA on CPAP 4. Hyperlipidemia 5. DM II 6. CKD 3b 7. GERD 8. Chronic spastic hemipareis affecting dominant side 9. Essential hypertension  Discharge Condition: Fair Disposition: SNF for rehab Diet recommendation: Heart healthy with modified carbohydrates.     Relevant past medical, surgical, family and social history reviewed and updated as indicated. Interim medical history since our last visit reviewed. Allergies and medications reviewed and updated. Outpatient Medications Prior to Visit  Medication Sig Dispense Refill  . Alpha-Lipoic Acid 600 MG CAPS Take 1 capsule (600 mg total) by mouth daily. 30 capsule 11  . atorvastatin (LIPITOR) 40 MG tablet Take 40 mg by mouth daily.    . celecoxib (CELEBREX) 200 MG capsule Take 1 capsule (200 mg total) by mouth daily. 90 capsule 1  . clopidogrel (PLAVIX) 75 MG tablet Take 1 tablet (75 mg total) by mouth daily. 90 tablet 3  . Coenzyme Q10 (COQ10) 150 MG CAPS Take 1 capsule by mouth 2 (two) times daily.    . diclofenac Sodium (VOLTAREN) 1 % GEL Apply 2 g topically 3 (three) times daily as needed. (Patient taking differently: Apply 2 g topically 3 (three) times daily as needed (pain). ) 100 g 1  . DULoxetine (CYMBALTA) 60 MG capsule TAKE 1 CAPSULE DAILY 90 capsule 3  . famotidine (PEPCID) 20 MG tablet Take 1 tablet (20 mg total) by mouth at bedtime.    Marland Kitchen  gabapentin (NEURONTIN) 300 MG capsule Take 2 capsules (600 mg total) by mouth 2 (two) times daily.    . insulin regular (HUMULIN R) 100 units/mL injection Inject 0.04 mLs (4 Units total) into the skin 3 (three) times daily before meals.    Marland Kitchen l-methylfolate-B6-B12 (METANX) 3-35-2 MG TABS tablet Take 1 tablet by mouth daily. 30 tablet 0  . levothyroxine (SYNTHROID) 50 MCG tablet Take 1 tablet (50 mcg total) by mouth daily before breakfast. Per endo [name]    . LUMIGAN 0.01 % SOLN Place 1 drop into the right eye at bedtime.    .  metoprolol tartrate (LOPRESSOR) 25 MG tablet TAKE ONE-HALF (1/2) TABLET (12.5 MG) AT BEDTIME WITH MORNING DOSE IF NEEDED (Patient taking differently: Take 12.5 mg by mouth See admin instructions. TAKE ONE-HALF (1/2) TABLET (12.5 MG) AT BEDTIME WITH MORNING DOSE IF NEEDED) 90 tablet 3  . nitroGLYCERIN (NITROSTAT) 0.4 MG SL tablet DISSOLVE 1 TABLET UNDER THE TONGUE EVERY 5 MINUTES AS NEEDED FOR CHEST PAIN (Patient taking differently: Place 0.4 mg under the tongue every 5 (five) minutes as needed for chest pain. ) 25 tablet 1  . Omega-3 Fatty Acids (FISH OIL) 1000 MG CAPS Take 1 capsule (1,000 mg total) by mouth in the morning and at bedtime.  0  . oxyCODONE (OXY IR/ROXICODONE) 5 MG immediate release tablet Take 1 tablet (5 mg total) by mouth 2 (two) times daily as needed. rls 20 tablet 0  . pramipexole (MIRAPEX) 1 MG tablet Take 1 tablet (1 mg total) by mouth at bedtime.    . RABEprazole (ACIPHEX) 20 MG tablet TAKE 1 TABLET DAILY 90 tablet 3  . rosuvastatin (CRESTOR) 40 MG tablet Take 1 tablet (40 mg total) by mouth daily. 90 tablet 3  . traZODone (DESYREL) 50 MG tablet Take 50 mg by mouth at bedtime as needed for sleep.    . vitamin C (ASCORBIC ACID) 500 MG tablet Take 1,000 mg by mouth daily.     . insulin glargine (LANTUS) 100 UNIT/ML injection Inject 0.2 mLs (20 Units total) into the skin at bedtime. 10 mL 11  . insulin regular (HUMULIN R) 100 units/mL injection Inject 0.04 mLs (4 Units total) into the skin 3 (three) times daily before meals. 10 mL 11  . ondansetron (ZOFRAN) 4 MG tablet Take 1 tablet (4 mg total) by mouth every 8 (eight) hours as needed for nausea or vomiting. 20 tablet 0  . insulin degludec (TRESIBA FLEXTOUCH) 200 UNIT/ML FlexTouch Pen Inject 90 Units into the skin at bedtime.    . valsartan (DIOVAN) 40 MG tablet Take 1 tablet (40 mg total) by mouth daily.     No facility-administered medications prior to visit.     Per HPI unless specifically indicated in ROS section  below Review of Systems Objective:  BP 134/60 (BP Location: Left Arm, Patient Position: Sitting, Cuff Size: Normal)   Pulse 71   Temp 97.9 F (36.6 C) (Temporal)   Ht 5\' 8"  (1.727 m)   Wt 187 lb 2 oz (84.9 kg)   SpO2 99%   BMI 28.45 kg/m   Wt Readings from Last 3 Encounters:  08/03/20 187 lb 2 oz (84.9 kg)  06/16/20 189 lb 4 oz (85.8 kg)  06/01/20 192 lb 5 oz (87.2 kg)      Physical Exam Vitals and nursing note reviewed.  Constitutional:      Appearance: Normal appearance. He is not ill-appearing.  Cardiovascular:     Rate and Rhythm: Normal rate  and regular rhythm.     Pulses: Normal pulses.     Heart sounds: Normal heart sounds. No murmur heard.   Pulmonary:     Effort: Pulmonary effort is normal. No respiratory distress.     Breath sounds: Normal breath sounds. No wheezing, rhonchi or rales.  Musculoskeletal:     Right lower leg: No edema.     Left lower leg: No edema.     Comments: R foot drop brace present  Neurological:     Mental Status: He is alert.     Comments: Chronic spastic R hemiparesis  Psychiatric:        Mood and Affect: Mood normal.        Behavior: Behavior normal.       Lab Results  Component Value Date   HGBA1C 7.9 (A) 06/01/2020    Lab Results  Component Value Date   WBC 6.9 07/02/2020   HGB 13.8 07/02/2020   HCT 43.1 07/02/2020   MCV 90.9 07/02/2020   PLT 127 (L) 07/02/2020    Lab Results  Component Value Date   CREATININE 1.61 (H) 07/04/2020   BUN 22 07/04/2020   NA 138 07/04/2020   K 3.9 07/04/2020   CL 104 07/04/2020   CO2 24 07/04/2020    Assessment & Plan:  This visit occurred during the SARS-CoV-2 public health emergency.  Safety protocols were in place, including screening questions prior to the visit, additional usage of staff PPE, and extensive cleaning of exam room while observing appropriate contact time as indicated for disinfecting solutions.   Problem List Items Addressed This Visit    Type 2 diabetes mellitus,  uncontrolled, with neuropathy ([place])    Diabetic regimen was changed during hospitalization - from tresiba 90u daily to lantus 20u daily, unclear why the drop. ozempic was also held. Patient has restarted previous home regimen of tresiba and wants to restart ozempic - advised he contact [name] endocrinology for guidance on antihyperglycemic regimen.       Relevant Medications   valsartan (DIOVAN) 40 MG tablet   insulin degludec (TRESIBA FLEXTOUCH) 200 UNIT/ML FlexTouch Pen   insulin regular (HUMULIN R) 100 units/mL injection   Spastic hemiparesis affecting dominant side (HCC)   Restless leg syndrome    Continues seeing [place] neurologist Dr Jeraldine Loots - reviewed current regimen.       HTN (hypertension)    Chronic, stable. Continue metoprolol and valsartan (restarted)      Relevant Medications   valsartan (DIOVAN) 40 MG tablet   Hemiparesis affecting right side as late effect of stroke (HCC)   GERD (gastroesophageal reflux disease)    Reviewed regimen which is aciphex 20mg  daily with pepcid at night.       Relevant Medications   ondansetron (ZOFRAN) 4 MG tablet   Fever - Primary    Post-COVID booster fever. Infectious workup was normal.           Meds ordered this encounter  Medications  . ondansetron (ZOFRAN) 4 MG tablet    Sig: Take 1 tablet (4 mg total) by mouth every 8 (eight) hours as needed for nausea or vomiting.    Dispense:  20 tablet    Refill:  0   No orders of the defined types were placed in this encounter.   Patient Instructions  Check with Dr Jeraldine Loots about need for oral iron.  Zofran refilled today.  Touch base with endocrinology about plans for antihyperglycemics. Continue previous regimen.    Follow up plan:  Return if symptoms worsen or fail to improve.  [name], MD

## 2020-08-04 DIAGNOSIS — G4733 Obstructive sleep apnea (adult) (pediatric): Secondary | ICD-10-CM | POA: Diagnosis not present

## 2020-08-04 DIAGNOSIS — I69351 Hemiplegia and hemiparesis following cerebral infarction affecting right dominant side: Secondary | ICD-10-CM | POA: Diagnosis not present

## 2020-08-04 DIAGNOSIS — Z794 Long term (current) use of insulin: Secondary | ICD-10-CM | POA: Diagnosis not present

## 2020-08-04 DIAGNOSIS — K219 Gastro-esophageal reflux disease without esophagitis: Secondary | ICD-10-CM | POA: Diagnosis not present

## 2020-08-04 DIAGNOSIS — W19XXXD Unspecified fall, subsequent encounter: Secondary | ICD-10-CM | POA: Diagnosis not present

## 2020-08-04 DIAGNOSIS — C44311 Basal cell carcinoma of skin of nose: Secondary | ICD-10-CM | POA: Diagnosis not present

## 2020-08-04 DIAGNOSIS — I129 Hypertensive chronic kidney disease with stage 1 through stage 4 chronic kidney disease, or unspecified chronic kidney disease: Secondary | ICD-10-CM | POA: Diagnosis not present

## 2020-08-04 DIAGNOSIS — E871 Hypo-osmolality and hyponatremia: Secondary | ICD-10-CM | POA: Diagnosis not present

## 2020-08-04 DIAGNOSIS — Z79891 Long term (current) use of opiate analgesic: Secondary | ICD-10-CM | POA: Diagnosis not present

## 2020-08-04 DIAGNOSIS — Z7902 Long term (current) use of antithrombotics/antiplatelets: Secondary | ICD-10-CM | POA: Diagnosis not present

## 2020-08-04 DIAGNOSIS — D631 Anemia in chronic kidney disease: Secondary | ICD-10-CM | POA: Diagnosis not present

## 2020-08-04 DIAGNOSIS — T50B95D Adverse effect of other viral vaccines, subsequent encounter: Secondary | ICD-10-CM | POA: Diagnosis not present

## 2020-08-04 DIAGNOSIS — N1832 Chronic kidney disease, stage 3b: Secondary | ICD-10-CM | POA: Diagnosis not present

## 2020-08-04 DIAGNOSIS — E1122 Type 2 diabetes mellitus with diabetic chronic kidney disease: Secondary | ICD-10-CM | POA: Diagnosis not present

## 2020-08-04 DIAGNOSIS — E785 Hyperlipidemia, unspecified: Secondary | ICD-10-CM | POA: Diagnosis not present

## 2020-08-04 DIAGNOSIS — M6281 Muscle weakness (generalized): Secondary | ICD-10-CM | POA: Diagnosis not present

## 2020-08-04 DIAGNOSIS — E1140 Type 2 diabetes mellitus with diabetic neuropathy, unspecified: Secondary | ICD-10-CM | POA: Diagnosis not present

## 2020-08-09 DIAGNOSIS — M6281 Muscle weakness (generalized): Secondary | ICD-10-CM | POA: Diagnosis not present

## 2020-08-09 DIAGNOSIS — E1140 Type 2 diabetes mellitus with diabetic neuropathy, unspecified: Secondary | ICD-10-CM | POA: Diagnosis not present

## 2020-08-09 DIAGNOSIS — D631 Anemia in chronic kidney disease: Secondary | ICD-10-CM | POA: Diagnosis not present

## 2020-08-09 DIAGNOSIS — T50B95D Adverse effect of other viral vaccines, subsequent encounter: Secondary | ICD-10-CM | POA: Diagnosis not present

## 2020-08-09 DIAGNOSIS — E871 Hypo-osmolality and hyponatremia: Secondary | ICD-10-CM | POA: Diagnosis not present

## 2020-08-09 DIAGNOSIS — W19XXXD Unspecified fall, subsequent encounter: Secondary | ICD-10-CM | POA: Diagnosis not present

## 2020-08-09 DIAGNOSIS — G4733 Obstructive sleep apnea (adult) (pediatric): Secondary | ICD-10-CM | POA: Diagnosis not present

## 2020-08-09 DIAGNOSIS — I69351 Hemiplegia and hemiparesis following cerebral infarction affecting right dominant side: Secondary | ICD-10-CM | POA: Diagnosis not present

## 2020-08-09 DIAGNOSIS — C44311 Basal cell carcinoma of skin of nose: Secondary | ICD-10-CM | POA: Diagnosis not present

## 2020-08-09 DIAGNOSIS — Z794 Long term (current) use of insulin: Secondary | ICD-10-CM | POA: Diagnosis not present

## 2020-08-09 DIAGNOSIS — I129 Hypertensive chronic kidney disease with stage 1 through stage 4 chronic kidney disease, or unspecified chronic kidney disease: Secondary | ICD-10-CM | POA: Diagnosis not present

## 2020-08-09 DIAGNOSIS — E785 Hyperlipidemia, unspecified: Secondary | ICD-10-CM | POA: Diagnosis not present

## 2020-08-09 DIAGNOSIS — K219 Gastro-esophageal reflux disease without esophagitis: Secondary | ICD-10-CM | POA: Diagnosis not present

## 2020-08-09 DIAGNOSIS — Z79891 Long term (current) use of opiate analgesic: Secondary | ICD-10-CM | POA: Diagnosis not present

## 2020-08-09 DIAGNOSIS — Z7902 Long term (current) use of antithrombotics/antiplatelets: Secondary | ICD-10-CM | POA: Diagnosis not present

## 2020-08-09 DIAGNOSIS — E1122 Type 2 diabetes mellitus with diabetic chronic kidney disease: Secondary | ICD-10-CM | POA: Diagnosis not present

## 2020-08-09 DIAGNOSIS — N1832 Chronic kidney disease, stage 3b: Secondary | ICD-10-CM | POA: Diagnosis not present

## 2020-08-16 DIAGNOSIS — Z794 Long term (current) use of insulin: Secondary | ICD-10-CM | POA: Diagnosis not present

## 2020-08-16 DIAGNOSIS — T50B95D Adverse effect of other viral vaccines, subsequent encounter: Secondary | ICD-10-CM | POA: Diagnosis not present

## 2020-08-16 DIAGNOSIS — N1832 Chronic kidney disease, stage 3b: Secondary | ICD-10-CM | POA: Diagnosis not present

## 2020-08-16 DIAGNOSIS — I129 Hypertensive chronic kidney disease with stage 1 through stage 4 chronic kidney disease, or unspecified chronic kidney disease: Secondary | ICD-10-CM | POA: Diagnosis not present

## 2020-08-16 DIAGNOSIS — C44311 Basal cell carcinoma of skin of nose: Secondary | ICD-10-CM | POA: Diagnosis not present

## 2020-08-16 DIAGNOSIS — Z7902 Long term (current) use of antithrombotics/antiplatelets: Secondary | ICD-10-CM | POA: Diagnosis not present

## 2020-08-16 DIAGNOSIS — Z79891 Long term (current) use of opiate analgesic: Secondary | ICD-10-CM | POA: Diagnosis not present

## 2020-08-16 DIAGNOSIS — E1122 Type 2 diabetes mellitus with diabetic chronic kidney disease: Secondary | ICD-10-CM | POA: Diagnosis not present

## 2020-08-16 DIAGNOSIS — E785 Hyperlipidemia, unspecified: Secondary | ICD-10-CM | POA: Diagnosis not present

## 2020-08-16 DIAGNOSIS — G4733 Obstructive sleep apnea (adult) (pediatric): Secondary | ICD-10-CM | POA: Diagnosis not present

## 2020-08-16 DIAGNOSIS — W19XXXD Unspecified fall, subsequent encounter: Secondary | ICD-10-CM | POA: Diagnosis not present

## 2020-08-16 DIAGNOSIS — I69351 Hemiplegia and hemiparesis following cerebral infarction affecting right dominant side: Secondary | ICD-10-CM | POA: Diagnosis not present

## 2020-08-16 DIAGNOSIS — K219 Gastro-esophageal reflux disease without esophagitis: Secondary | ICD-10-CM | POA: Diagnosis not present

## 2020-08-16 DIAGNOSIS — D631 Anemia in chronic kidney disease: Secondary | ICD-10-CM | POA: Diagnosis not present

## 2020-08-16 DIAGNOSIS — E1140 Type 2 diabetes mellitus with diabetic neuropathy, unspecified: Secondary | ICD-10-CM | POA: Diagnosis not present

## 2020-08-16 DIAGNOSIS — E871 Hypo-osmolality and hyponatremia: Secondary | ICD-10-CM | POA: Diagnosis not present

## 2020-08-16 DIAGNOSIS — M6281 Muscle weakness (generalized): Secondary | ICD-10-CM | POA: Diagnosis not present

## 2020-08-18 DIAGNOSIS — E785 Hyperlipidemia, unspecified: Secondary | ICD-10-CM | POA: Diagnosis not present

## 2020-08-18 DIAGNOSIS — Z794 Long term (current) use of insulin: Secondary | ICD-10-CM | POA: Diagnosis not present

## 2020-08-18 DIAGNOSIS — W19XXXD Unspecified fall, subsequent encounter: Secondary | ICD-10-CM | POA: Diagnosis not present

## 2020-08-18 DIAGNOSIS — I129 Hypertensive chronic kidney disease with stage 1 through stage 4 chronic kidney disease, or unspecified chronic kidney disease: Secondary | ICD-10-CM | POA: Diagnosis not present

## 2020-08-18 DIAGNOSIS — Z7902 Long term (current) use of antithrombotics/antiplatelets: Secondary | ICD-10-CM | POA: Diagnosis not present

## 2020-08-18 DIAGNOSIS — Z79891 Long term (current) use of opiate analgesic: Secondary | ICD-10-CM | POA: Diagnosis not present

## 2020-08-18 DIAGNOSIS — G4733 Obstructive sleep apnea (adult) (pediatric): Secondary | ICD-10-CM | POA: Diagnosis not present

## 2020-08-18 DIAGNOSIS — M6281 Muscle weakness (generalized): Secondary | ICD-10-CM | POA: Diagnosis not present

## 2020-08-18 DIAGNOSIS — K219 Gastro-esophageal reflux disease without esophagitis: Secondary | ICD-10-CM | POA: Diagnosis not present

## 2020-08-18 DIAGNOSIS — C44311 Basal cell carcinoma of skin of nose: Secondary | ICD-10-CM | POA: Diagnosis not present

## 2020-08-18 DIAGNOSIS — E871 Hypo-osmolality and hyponatremia: Secondary | ICD-10-CM | POA: Diagnosis not present

## 2020-08-18 DIAGNOSIS — D631 Anemia in chronic kidney disease: Secondary | ICD-10-CM | POA: Diagnosis not present

## 2020-08-18 DIAGNOSIS — I69351 Hemiplegia and hemiparesis following cerebral infarction affecting right dominant side: Secondary | ICD-10-CM | POA: Diagnosis not present

## 2020-08-18 DIAGNOSIS — E1122 Type 2 diabetes mellitus with diabetic chronic kidney disease: Secondary | ICD-10-CM | POA: Diagnosis not present

## 2020-08-18 DIAGNOSIS — T50B95D Adverse effect of other viral vaccines, subsequent encounter: Secondary | ICD-10-CM | POA: Diagnosis not present

## 2020-08-18 DIAGNOSIS — N1832 Chronic kidney disease, stage 3b: Secondary | ICD-10-CM | POA: Diagnosis not present

## 2020-08-18 DIAGNOSIS — E1140 Type 2 diabetes mellitus with diabetic neuropathy, unspecified: Secondary | ICD-10-CM | POA: Diagnosis not present

## 2020-08-23 ENCOUNTER — Telehealth: Payer: Self-pay | Admitting: Family Medicine

## 2020-08-23 DIAGNOSIS — W19XXXD Unspecified fall, subsequent encounter: Secondary | ICD-10-CM | POA: Diagnosis not present

## 2020-08-23 DIAGNOSIS — M6281 Muscle weakness (generalized): Secondary | ICD-10-CM | POA: Diagnosis not present

## 2020-08-23 DIAGNOSIS — E871 Hypo-osmolality and hyponatremia: Secondary | ICD-10-CM | POA: Diagnosis not present

## 2020-08-23 DIAGNOSIS — I129 Hypertensive chronic kidney disease with stage 1 through stage 4 chronic kidney disease, or unspecified chronic kidney disease: Secondary | ICD-10-CM | POA: Diagnosis not present

## 2020-08-23 DIAGNOSIS — G4733 Obstructive sleep apnea (adult) (pediatric): Secondary | ICD-10-CM | POA: Diagnosis not present

## 2020-08-23 DIAGNOSIS — N1832 Chronic kidney disease, stage 3b: Secondary | ICD-10-CM | POA: Diagnosis not present

## 2020-08-23 DIAGNOSIS — K219 Gastro-esophageal reflux disease without esophagitis: Secondary | ICD-10-CM | POA: Diagnosis not present

## 2020-08-23 DIAGNOSIS — E1140 Type 2 diabetes mellitus with diabetic neuropathy, unspecified: Secondary | ICD-10-CM | POA: Diagnosis not present

## 2020-08-23 DIAGNOSIS — Z79891 Long term (current) use of opiate analgesic: Secondary | ICD-10-CM | POA: Diagnosis not present

## 2020-08-23 DIAGNOSIS — E785 Hyperlipidemia, unspecified: Secondary | ICD-10-CM | POA: Diagnosis not present

## 2020-08-23 DIAGNOSIS — D631 Anemia in chronic kidney disease: Secondary | ICD-10-CM | POA: Diagnosis not present

## 2020-08-23 DIAGNOSIS — C44311 Basal cell carcinoma of skin of nose: Secondary | ICD-10-CM | POA: Diagnosis not present

## 2020-08-23 DIAGNOSIS — Z7902 Long term (current) use of antithrombotics/antiplatelets: Secondary | ICD-10-CM | POA: Diagnosis not present

## 2020-08-23 DIAGNOSIS — I69351 Hemiplegia and hemiparesis following cerebral infarction affecting right dominant side: Secondary | ICD-10-CM | POA: Diagnosis not present

## 2020-08-23 DIAGNOSIS — T50B95D Adverse effect of other viral vaccines, subsequent encounter: Secondary | ICD-10-CM | POA: Diagnosis not present

## 2020-08-23 DIAGNOSIS — E1122 Type 2 diabetes mellitus with diabetic chronic kidney disease: Secondary | ICD-10-CM | POA: Diagnosis not present

## 2020-08-23 DIAGNOSIS — Z794 Long term (current) use of insulin: Secondary | ICD-10-CM | POA: Diagnosis not present

## 2020-08-23 NOTE — Progress Notes (Signed)
  Chronic Care Management   Outreach Note  08/23/2020 Name: James PIMENTA, MD MRN: [id number] DOB: 30-Nov-1930  Referred by: [name], MD Reason for referral : Chronic Care Management   An unsuccessful telephone outreach was attempted today. The patient was referred to the pharmacist for assistance with care management and care coordination.   Follow Up Plan:   Hilario Quarry  Upstream Scheduler

## 2020-08-25 DIAGNOSIS — E1140 Type 2 diabetes mellitus with diabetic neuropathy, unspecified: Secondary | ICD-10-CM | POA: Diagnosis not present

## 2020-08-25 DIAGNOSIS — G4733 Obstructive sleep apnea (adult) (pediatric): Secondary | ICD-10-CM | POA: Diagnosis not present

## 2020-08-25 DIAGNOSIS — D631 Anemia in chronic kidney disease: Secondary | ICD-10-CM | POA: Diagnosis not present

## 2020-08-25 DIAGNOSIS — W19XXXD Unspecified fall, subsequent encounter: Secondary | ICD-10-CM | POA: Diagnosis not present

## 2020-08-25 DIAGNOSIS — M6281 Muscle weakness (generalized): Secondary | ICD-10-CM | POA: Diagnosis not present

## 2020-08-25 DIAGNOSIS — N1832 Chronic kidney disease, stage 3b: Secondary | ICD-10-CM | POA: Diagnosis not present

## 2020-08-25 DIAGNOSIS — I129 Hypertensive chronic kidney disease with stage 1 through stage 4 chronic kidney disease, or unspecified chronic kidney disease: Secondary | ICD-10-CM | POA: Diagnosis not present

## 2020-08-25 DIAGNOSIS — I69351 Hemiplegia and hemiparesis following cerebral infarction affecting right dominant side: Secondary | ICD-10-CM | POA: Diagnosis not present

## 2020-08-25 DIAGNOSIS — T50B95D Adverse effect of other viral vaccines, subsequent encounter: Secondary | ICD-10-CM | POA: Diagnosis not present

## 2020-08-25 DIAGNOSIS — Z794 Long term (current) use of insulin: Secondary | ICD-10-CM | POA: Diagnosis not present

## 2020-08-25 DIAGNOSIS — K219 Gastro-esophageal reflux disease without esophagitis: Secondary | ICD-10-CM | POA: Diagnosis not present

## 2020-08-25 DIAGNOSIS — E785 Hyperlipidemia, unspecified: Secondary | ICD-10-CM | POA: Diagnosis not present

## 2020-08-25 DIAGNOSIS — Z7902 Long term (current) use of antithrombotics/antiplatelets: Secondary | ICD-10-CM | POA: Diagnosis not present

## 2020-08-25 DIAGNOSIS — Z79891 Long term (current) use of opiate analgesic: Secondary | ICD-10-CM | POA: Diagnosis not present

## 2020-08-25 DIAGNOSIS — C44311 Basal cell carcinoma of skin of nose: Secondary | ICD-10-CM | POA: Diagnosis not present

## 2020-08-25 DIAGNOSIS — E1122 Type 2 diabetes mellitus with diabetic chronic kidney disease: Secondary | ICD-10-CM | POA: Diagnosis not present

## 2020-08-25 DIAGNOSIS — E871 Hypo-osmolality and hyponatremia: Secondary | ICD-10-CM | POA: Diagnosis not present

## 2020-08-30 DIAGNOSIS — M6281 Muscle weakness (generalized): Secondary | ICD-10-CM | POA: Diagnosis not present

## 2020-08-30 DIAGNOSIS — C44311 Basal cell carcinoma of skin of nose: Secondary | ICD-10-CM | POA: Diagnosis not present

## 2020-08-30 DIAGNOSIS — W19XXXD Unspecified fall, subsequent encounter: Secondary | ICD-10-CM | POA: Diagnosis not present

## 2020-08-30 DIAGNOSIS — E1122 Type 2 diabetes mellitus with diabetic chronic kidney disease: Secondary | ICD-10-CM | POA: Diagnosis not present

## 2020-08-30 DIAGNOSIS — Z7902 Long term (current) use of antithrombotics/antiplatelets: Secondary | ICD-10-CM | POA: Diagnosis not present

## 2020-08-30 DIAGNOSIS — Z79891 Long term (current) use of opiate analgesic: Secondary | ICD-10-CM | POA: Diagnosis not present

## 2020-08-30 DIAGNOSIS — I129 Hypertensive chronic kidney disease with stage 1 through stage 4 chronic kidney disease, or unspecified chronic kidney disease: Secondary | ICD-10-CM | POA: Diagnosis not present

## 2020-08-30 DIAGNOSIS — E785 Hyperlipidemia, unspecified: Secondary | ICD-10-CM | POA: Diagnosis not present

## 2020-08-30 DIAGNOSIS — Z794 Long term (current) use of insulin: Secondary | ICD-10-CM | POA: Diagnosis not present

## 2020-08-30 DIAGNOSIS — G4733 Obstructive sleep apnea (adult) (pediatric): Secondary | ICD-10-CM | POA: Diagnosis not present

## 2020-08-30 DIAGNOSIS — K219 Gastro-esophageal reflux disease without esophagitis: Secondary | ICD-10-CM | POA: Diagnosis not present

## 2020-08-30 DIAGNOSIS — D631 Anemia in chronic kidney disease: Secondary | ICD-10-CM | POA: Diagnosis not present

## 2020-08-30 DIAGNOSIS — E871 Hypo-osmolality and hyponatremia: Secondary | ICD-10-CM | POA: Diagnosis not present

## 2020-08-30 DIAGNOSIS — I69351 Hemiplegia and hemiparesis following cerebral infarction affecting right dominant side: Secondary | ICD-10-CM | POA: Diagnosis not present

## 2020-08-30 DIAGNOSIS — N1832 Chronic kidney disease, stage 3b: Secondary | ICD-10-CM | POA: Diagnosis not present

## 2020-08-30 DIAGNOSIS — E1140 Type 2 diabetes mellitus with diabetic neuropathy, unspecified: Secondary | ICD-10-CM | POA: Diagnosis not present

## 2020-08-30 DIAGNOSIS — T50B95D Adverse effect of other viral vaccines, subsequent encounter: Secondary | ICD-10-CM | POA: Diagnosis not present

## 2020-08-31 ENCOUNTER — Other Ambulatory Visit: Payer: Self-pay

## 2020-08-31 ENCOUNTER — Ambulatory Visit (INDEPENDENT_AMBULATORY_CARE_PROVIDER_SITE_OTHER): Payer: Medicare Other | Admitting: Family Medicine

## 2020-08-31 ENCOUNTER — Encounter: Payer: Self-pay | Admitting: Family Medicine

## 2020-08-31 VITALS — BP 122/60 | HR 75 | Temp 97.5°F | Ht 68.0 in | Wt 189.0 lb

## 2020-08-31 DIAGNOSIS — E1140 Type 2 diabetes mellitus with diabetic neuropathy, unspecified: Secondary | ICD-10-CM

## 2020-08-31 DIAGNOSIS — I6930 Unspecified sequelae of cerebral infarction: Secondary | ICD-10-CM | POA: Diagnosis not present

## 2020-08-31 DIAGNOSIS — E1165 Type 2 diabetes mellitus with hyperglycemia: Secondary | ICD-10-CM

## 2020-08-31 DIAGNOSIS — G2581 Restless legs syndrome: Secondary | ICD-10-CM | POA: Diagnosis not present

## 2020-08-31 DIAGNOSIS — Z794 Long term (current) use of insulin: Secondary | ICD-10-CM | POA: Diagnosis not present

## 2020-08-31 DIAGNOSIS — I5189 Other ill-defined heart diseases: Secondary | ICD-10-CM | POA: Diagnosis not present

## 2020-08-31 DIAGNOSIS — E785 Hyperlipidemia, unspecified: Secondary | ICD-10-CM | POA: Diagnosis not present

## 2020-08-31 DIAGNOSIS — I69351 Hemiplegia and hemiparesis following cerebral infarction affecting right dominant side: Secondary | ICD-10-CM | POA: Diagnosis not present

## 2020-08-31 DIAGNOSIS — E1142 Type 2 diabetes mellitus with diabetic polyneuropathy: Secondary | ICD-10-CM | POA: Diagnosis not present

## 2020-08-31 DIAGNOSIS — E039 Hypothyroidism, unspecified: Secondary | ICD-10-CM | POA: Diagnosis not present

## 2020-08-31 DIAGNOSIS — R600 Localized edema: Secondary | ICD-10-CM

## 2020-08-31 DIAGNOSIS — IMO0002 Reserved for concepts with insufficient information to code with codable children: Secondary | ICD-10-CM

## 2020-08-31 NOTE — Patient Instructions (Addendum)
You are doing well today  Return in 3 months with labwork - we will chcek iron levels at that time as well as BNP.  For leg swelling - elevate legs, limit salt/sodium to 2 gms/day, ideally <1.5gm/day, increase water intake.

## 2020-08-31 NOTE — Assessment & Plan Note (Signed)
Stable period on current regimen of gabapentin, oxycodone and mirapex 1mg  daily. S/p iron infusion 05/2020, currently off oral iron. Will need iron levels updated next labwork.

## 2020-08-31 NOTE — Assessment & Plan Note (Addendum)
Leg swelling noted. rec leg elevation, avoiding sodium in diet, increased water. Consider BNP next labwork and/or updated echo (latest one 2016 reviewed with patient).

## 2020-08-31 NOTE — Progress Notes (Signed)
Patient ID: James Spurr, MD, male    DOB: 1931-08-13, 86 y.o.   MRN: [id number]  This visit was conducted in person.  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 )   Known h/o L subcortical infarct with resultant chronic R hemiparesis.   DM - upcoming endo appt later today [name]) - will get labs through them. Sugars averaging 140s. He continues tresiba 90u daily along with novolin 4u PRN. Also on ozempic 1mg  weekly. Manages constipation with daily stool softener.  Lab Results  Component Value Date   HGBA1C 7.9 (A) 06/01/2020     RLS - seeing [facility] neurologist Dr Jeraldine Loots for this - managing with gabapentin 600mg  bid and 1 oxycodone 5mg  daily. He continues mirapex 1mg  and cymbalta 60mg  daily. Overall stable period. He did receive iron infusions x2 through [facility] (05/2020). No longer on oral iron.   Weight stable. Mild cough. No significant dyspnea. Hasn't noted leg swelling.      Relevant past medical, surgical, family and social history reviewed and updated as indicated. Interim medical history since our last visit reviewed. Allergies and medications reviewed and updated. Outpatient Medications Prior to Visit  Medication Sig Dispense Refill  . Alpha-Lipoic Acid 600 MG CAPS Take 1 capsule (600 mg total) by mouth daily. 30 capsule 11  . atorvastatin (LIPITOR) 40 MG tablet Take 40 mg by mouth daily.    . celecoxib (CELEBREX) 200 MG capsule Take 1 capsule (200 mg total) by mouth daily. 90 capsule 1  . clopidogrel (PLAVIX) 75 MG tablet Take 1 tablet (75 mg total) by mouth daily. 90 tablet 3  . Coenzyme Q10 (COQ10) 150 MG CAPS Take 1 capsule by mouth 2 (two) times daily.    .  diclofenac Sodium (VOLTAREN) 1 % GEL Apply 2 g topically 3 (three) times daily as needed. (Patient taking differently: Apply 2 g topically 3 (three) times daily as needed (pain).) 100 g 1  . DULoxetine (CYMBALTA) 60 MG capsule TAKE 1 CAPSULE DAILY 90 capsule 3  . famotidine (PEPCID) 20 MG tablet Take 1 tablet (20 mg total) by mouth at bedtime.    . gabapentin (NEURONTIN) 300 MG capsule Take 2 capsules (600 mg total) by mouth 2 (two) times daily.    . insulin degludec (TRESIBA FLEXTOUCH) 200 UNIT/ML FlexTouch Pen Inject 90 Units into the skin at bedtime. As needed    . insulin regular (NOVOLIN R) 100 units/mL injection Inject 0.04 mLs (4 Units total) into the skin 3 (three) times daily before meals.    Marland Kitchen l-methylfolate-B6-B12 (METANX) 3-35-2 MG TABS tablet Take 1 tablet by mouth daily. 30 tablet 0  . levothyroxine (SYNTHROID) 50 MCG tablet Take 1 tablet (50 mcg total) by mouth daily before breakfast. Per endo [name]    . LUMIGAN 0.01 % SOLN Place 1 drop into the right eye at bedtime.    . metoprolol tartrate (LOPRESSOR) 25 MG tablet TAKE ONE-HALF (1/2) TABLET (12.5 MG) AT BEDTIME WITH MORNING DOSE IF NEEDED (Patient taking differently: Take 12.5 mg by mouth See admin instructions. TAKE ONE-HALF (1/2)  TABLET (12.5 MG) AT BEDTIME WITH MORNING DOSE IF NEEDED) 90 tablet 3  . nitroGLYCERIN (NITROSTAT) 0.4 MG SL tablet DISSOLVE 1 TABLET UNDER THE TONGUE EVERY 5 MINUTES AS NEEDED FOR CHEST PAIN (Patient taking differently: Place 0.4 mg under the tongue every 5 (five) minutes as needed for chest pain.) 25 tablet 1  . Omega-3 Fatty Acids (FISH OIL) 1000 MG CAPS Take 1 capsule (1,000 mg total) by mouth in the morning and at bedtime.  0  . ondansetron (ZOFRAN) 4 MG tablet Take 1 tablet (4 mg total) by mouth every 8 (eight) hours as needed for nausea or vomiting. 20 tablet 0  . oxyCODONE (OXY IR/ROXICODONE) 5 MG immediate release tablet Take 1 tablet (5 mg total) by mouth 2 (two) times daily as needed. rls 20  tablet 0  . pramipexole (MIRAPEX) 1 MG tablet Take 1 tablet (1 mg total) by mouth at bedtime.    . RABEprazole (ACIPHEX) 20 MG tablet TAKE 1 TABLET DAILY 90 tablet 3  . rosuvastatin (CRESTOR) 40 MG tablet Take 1 tablet (40 mg total) by mouth daily. 90 tablet 3  . Semaglutide, 1 MG/DOSE, (OZEMPIC, 1 MG/DOSE,) 2 MG/1.5ML SOPN 1 mg weekly    . traZODone (DESYREL) 50 MG tablet Take 50 mg by mouth at bedtime as needed for sleep.    . valsartan (DIOVAN) 40 MG tablet Take 1 tablet (40 mg total) by mouth daily.    . vitamin C (ASCORBIC ACID) 500 MG tablet Take 1,000 mg by mouth daily.      No facility-administered medications prior to visit.     Per HPI unless specifically indicated in ROS section below Review of Systems Objective:  BP 122/60 (BP Location: Left Arm, Patient Position: Sitting, Cuff Size: Normal)   Pulse 75   Temp (!) 97.5 F (36.4 C) (Temporal)   Ht 5\' 8"  (1.727 m)   Wt 189 lb (85.7 kg)   SpO2 96%   BMI 28.74 kg/m   Wt Readings from Last 3 Encounters:  08/31/20 189 lb (85.7 kg)  08/03/20 187 lb 2 oz (84.9 kg)  06/16/20 189 lb 4 oz (85.8 kg)      Physical Exam Vitals and nursing note reviewed.  Constitutional:      Appearance: Normal appearance. He is not ill-appearing.  Cardiovascular:     Rate and Rhythm: Normal rate and regular rhythm.     Pulses: Normal pulses.     Heart sounds: Normal heart sounds. No murmur heard.   Pulmonary:     Effort: Pulmonary effort is normal. No respiratory distress.     Breath sounds: Normal breath sounds. No wheezing, rhonchi or rales.  Musculoskeletal:     Right lower leg: Edema (2+) present.     Left lower leg: Edema (1+) present.  Skin:    General: Skin is warm and dry.     Findings: No rash.  Neurological:     Mental Status: He is alert.     Comments: Chronic R hemiparesis  Psychiatric:        Mood and Affect: Mood normal.        Behavior: Behavior normal.       Lab Results  Component Value Date   CREATININE 1.61  (H) 07/04/2020   BUN 22 07/04/2020   NA 138 07/04/2020   K 3.9 07/04/2020   CL 104 07/04/2020   CO2 24 07/04/2020    Lab Results  Component Value Date   HGBA1C 7.9 (A) 06/01/2020  Assessment & Plan:  This visit occurred during the SARS-CoV-2 public health emergency.  Safety protocols were in place, including screening questions prior to the visit, additional usage of staff PPE, and extensive cleaning of exam room while observing appropriate contact time as indicated for disinfecting solutions.   Problem List Items Addressed This Visit    Type 2 diabetes mellitus, uncontrolled, with neuropathy ([place]) - Primary    Chronic, seems stable based on recall cbg. He has restarted full dose ozempic without nausea or significant GI upset. He has endo appt later today - will defer labwork to them. He states he gets foot exam regularly through endo.       Relevant Medications   Semaglutide, 1 MG/DOSE, (OZEMPIC, 1 MG/DOSE,) 2 MG/1.5ML SOPN   Restless leg syndrome    Stable period on current regimen of gabapentin, oxycodone and mirapex 1mg  daily. S/p iron infusion 05/2020, currently off oral iron. Will need iron levels updated next labwork.       Hemiparesis affecting right side as late effect of stroke (HCC)   Diastolic dysfunction    Leg swelling noted. rec leg elevation, avoiding sodium in diet, increased water. Consider BNP next labwork and/or updated echo (latest one 2016 reviewed with patient).        Other Visit Diagnoses    Pedal edema           No orders of the defined types were placed in this encounter.  No orders of the defined types were placed in this encounter.   Patient Instructions  You are doing well today  Return in 3 months with labwork - we will chcek iron levels at that time as well as BNP.  For leg swelling - elevate legs, limit salt/sodium to 2 gms/day, ideally <1.5gm/day, increase water intake.    Follow up plan: Return in about 3 months (around 11/29/2020) for  follow up visit.  [name], MD

## 2020-08-31 NOTE — Assessment & Plan Note (Signed)
Chronic, seems stable based on recall cbg. He has restarted full dose ozempic without nausea or significant GI upset. He has endo appt later today - will defer labwork to them. He states he gets foot exam regularly through endo.

## 2020-09-02 ENCOUNTER — Encounter: Payer: Self-pay | Admitting: Family Medicine

## 2020-09-06 DIAGNOSIS — I129 Hypertensive chronic kidney disease with stage 1 through stage 4 chronic kidney disease, or unspecified chronic kidney disease: Secondary | ICD-10-CM | POA: Diagnosis not present

## 2020-09-06 DIAGNOSIS — G4733 Obstructive sleep apnea (adult) (pediatric): Secondary | ICD-10-CM | POA: Diagnosis not present

## 2020-09-06 DIAGNOSIS — E1140 Type 2 diabetes mellitus with diabetic neuropathy, unspecified: Secondary | ICD-10-CM | POA: Diagnosis not present

## 2020-09-06 DIAGNOSIS — C44311 Basal cell carcinoma of skin of nose: Secondary | ICD-10-CM | POA: Diagnosis not present

## 2020-09-06 DIAGNOSIS — Z79891 Long term (current) use of opiate analgesic: Secondary | ICD-10-CM | POA: Diagnosis not present

## 2020-09-06 DIAGNOSIS — M6281 Muscle weakness (generalized): Secondary | ICD-10-CM | POA: Diagnosis not present

## 2020-09-06 DIAGNOSIS — W19XXXD Unspecified fall, subsequent encounter: Secondary | ICD-10-CM | POA: Diagnosis not present

## 2020-09-06 DIAGNOSIS — Z794 Long term (current) use of insulin: Secondary | ICD-10-CM | POA: Diagnosis not present

## 2020-09-06 DIAGNOSIS — N1832 Chronic kidney disease, stage 3b: Secondary | ICD-10-CM | POA: Diagnosis not present

## 2020-09-06 DIAGNOSIS — T50B95D Adverse effect of other viral vaccines, subsequent encounter: Secondary | ICD-10-CM | POA: Diagnosis not present

## 2020-09-06 DIAGNOSIS — I69351 Hemiplegia and hemiparesis following cerebral infarction affecting right dominant side: Secondary | ICD-10-CM | POA: Diagnosis not present

## 2020-09-06 DIAGNOSIS — E871 Hypo-osmolality and hyponatremia: Secondary | ICD-10-CM | POA: Diagnosis not present

## 2020-09-06 DIAGNOSIS — E1122 Type 2 diabetes mellitus with diabetic chronic kidney disease: Secondary | ICD-10-CM | POA: Diagnosis not present

## 2020-09-06 DIAGNOSIS — E785 Hyperlipidemia, unspecified: Secondary | ICD-10-CM | POA: Diagnosis not present

## 2020-09-06 DIAGNOSIS — Z7902 Long term (current) use of antithrombotics/antiplatelets: Secondary | ICD-10-CM | POA: Diagnosis not present

## 2020-09-06 DIAGNOSIS — D631 Anemia in chronic kidney disease: Secondary | ICD-10-CM | POA: Diagnosis not present

## 2020-09-06 DIAGNOSIS — K219 Gastro-esophageal reflux disease without esophagitis: Secondary | ICD-10-CM | POA: Diagnosis not present

## 2020-09-13 ENCOUNTER — Telehealth: Payer: Self-pay | Admitting: Family Medicine

## 2020-09-13 NOTE — Chronic Care Management (AMB) (Signed)
  Chronic Care Management   Note  09/13/2020 Name: JAMALE SPANGLER, MD MRN: [id number] DOB: 29-Nov-1930  [name], MD is a 86 y.o. year old male who is a primary care patient of Eustaquio Boyden, MD. I reached out to [name], MD by phone today in response to a referral sent by Mr. [name], MD's PCP, Eustaquio Boyden, MD.   Mr. Ferencz was given information about Chronic Care Management services today including:  1. CCM service includes personalized support from designated clinical staff supervised by his physician, including individualized plan of care and coordination with other care providers 2. 24/7 contact phone numbers for assistance for urgent and routine care needs. 3. Service will only be billed when office clinical staff spend 20 minutes or more in a month to coordinate care. 4. Only one practitioner may furnish and bill the service in a calendar month. 5. The patient may stop CCM services at any time (effective at the end of the month) by phone call to the office staff.   Patient agreed to services and verbal consent obtained.   Follow up plan:   Aggie Hacker  Upstream Scheduler

## 2020-09-27 DIAGNOSIS — E1165 Type 2 diabetes mellitus with hyperglycemia: Secondary | ICD-10-CM | POA: Diagnosis not present

## 2020-09-28 ENCOUNTER — Telehealth: Payer: Self-pay | Admitting: Family Medicine

## 2020-09-28 MED ORDER — ONDANSETRON HCL 4 MG PO TABS: 4.0000 mg | ORAL_TABLET | Freq: Three times a day (TID) | ORAL | 0 refills | Status: DC | PRN

## 2020-09-28 NOTE — Telephone Encounter (Signed)
Pt is requesting refill on Zofran to be sent to [facility].

## 2020-09-28 NOTE — Telephone Encounter (Signed)
E-scribed refill 

## 2020-10-11 ENCOUNTER — Telehealth: Payer: Self-pay

## 2020-10-11 NOTE — Chronic Care Management (AMB) (Signed)
Chronic Care Management Pharmacy Assistant   Name: James TOPPING, MD  MRN: [id number] DOB: [birth year]  Reason for Encounter: Initial Questions for CCM visit 10/13/20  PCP : [name], MD  Allergies:   Allergies  Allergen Reactions  . Dulaglutide     Other reaction(s): upset stomach  . Empagliflozin     Other reaction(s): yeast infection  . Gabapentin Other (See Comments)    At high doses - BLURRED VISION, BALLANCE, CONCENTRATION PROBLEMS, RESTLESSNESS, NOT SLEEPING, ? LOST OF STRENGTH, UNSTEADINESS  . Lisinopril Cough    tachycardia  . Nexium [Esomeprazole] Other (See Comments)    Headache  . Niacin And Related Other (See Comments)    flushing  . Other     Other reaction(s): muscle pain  . Pregabalin     Other reaction(s): MALAISE, SLIGHT NAUSEA, SLIGHT HEADACHE    Medications: Outpatient Encounter Medications as of 10/11/2020  Medication Sig Note  . Alpha-Lipoic Acid 600 MG CAPS Take 1 capsule (600 mg total) by mouth daily.   Marland Kitchen atorvastatin (LIPITOR) 40 MG tablet Take 40 mg by mouth daily.   . celecoxib (CELEBREX) 200 MG capsule Take 1 capsule (200 mg total) by mouth daily.   . clopidogrel (PLAVIX) 75 MG tablet Take 1 tablet (75 mg total) by mouth daily.   . Coenzyme Q10 (COQ10) 150 MG CAPS Take 1 capsule by mouth 2 (two) times daily.   . diclofenac Sodium (VOLTAREN) 1 % GEL Apply 2 g topically 3 (three) times daily as needed. (Patient taking differently: Apply 2 g topically 3 (three) times daily as needed (pain).)   . DULoxetine (CYMBALTA) 60 MG capsule TAKE 1 CAPSULE DAILY   . famotidine (PEPCID) 20 MG tablet Take 1 tablet (20 mg total) by mouth at bedtime.   . gabapentin (NEURONTIN) 300 MG capsule Take 2 capsules (600 mg total) by mouth 2 (two) times daily.   . insulin degludec (TRESIBA FLEXTOUCH) 200 UNIT/ML FlexTouch Pen Inject 90 Units into the skin at bedtime. As needed   . insulin regular (NOVOLIN R) 100 units/mL injection Inject 0.04 mLs (4 Units  total) into the skin 3 (three) times daily before meals.   Marland Kitchen l-methylfolate-B6-B12 (METANX) 3-35-2 MG TABS tablet Take 1 tablet by mouth daily.   Marland Kitchen levothyroxine (SYNTHROID) 50 MCG tablet Take 1 tablet (50 mcg total) by mouth daily before breakfast. Per endo [name]   . LUMIGAN 0.01 % SOLN Place 1 drop into the right eye at bedtime.   . metoprolol tartrate (LOPRESSOR) 25 MG tablet TAKE ONE-HALF (1/2) TABLET (12.5 MG) AT BEDTIME WITH MORNING DOSE IF NEEDED (Patient taking differently: Take 12.5 mg by mouth See admin instructions. TAKE ONE-HALF (1/2) TABLET (12.5 MG) AT BEDTIME WITH MORNING DOSE IF NEEDED)   . nitroGLYCERIN (NITROSTAT) 0.4 MG SL tablet DISSOLVE 1 TABLET UNDER THE TONGUE EVERY 5 MINUTES AS NEEDED FOR CHEST PAIN (Patient taking differently: Place 0.4 mg under the tongue every 5 (five) minutes as needed for chest pain.) 07/02/2020: Prescription expired on July 28, 2017 and can no longer be refilled.   . Omega-3 Fatty Acids (FISH OIL) 1000 MG CAPS Take 1 capsule (1,000 mg total) by mouth in the morning and at bedtime.   . ondansetron (ZOFRAN) 4 MG tablet Take 1 tablet (4 mg total) by mouth every 8 (eight) hours as needed for nausea or vomiting.   Marland Kitchen oxyCODONE (OXY IR/ROXICODONE) 5 MG immediate release tablet Take 1 tablet (5 mg total) by mouth 2 (two) times  daily as needed. rls   . pramipexole (MIRAPEX) 1 MG tablet Take 1 tablet (1 mg total) by mouth at bedtime.   . RABEprazole (ACIPHEX) 20 MG tablet TAKE 1 TABLET DAILY   . rosuvastatin (CRESTOR) 40 MG tablet Take 1 tablet (40 mg total) by mouth daily.   . Semaglutide, 1 MG/DOSE, (OZEMPIC, 1 MG/DOSE,) 2 MG/1.5ML SOPN 1 mg weekly   . traZODone (DESYREL) 50 MG tablet Take 50 mg by mouth at bedtime as needed for sleep.   . valsartan (DIOVAN) 40 MG tablet Take 1 tablet (40 mg total) by mouth daily.   . vitamin C (ASCORBIC ACID) 500 MG tablet Take 1,000 mg by mouth daily.     No facility-administered encounter medications on file as of  10/11/2020.    Current Diagnosis: Patient Active Problem List   Diagnosis Date Noted  . Fever 07/02/2020  . Healthcare maintenance 06/01/2020  . Involuntary movements 04/25/2020  . Post-nasal drainage 07/16/2019  . Recurrent productive cough 05/31/2019  . Hx of basal cell carcinoma 07/10/2017  . Rosacea   . Gait disturbance, post-stroke 10/05/2015  . Presbycusis of both ears   . Advanced care planning/counseling discussion 06/23/2015  . Impaired mobility 04/23/2015  . Hearing loss of right ear due to cerumen impaction 03/23/2015  . HTN (hypertension) 03/18/2015  . Diastolic dysfunction 69/48/5462  . Spastic hemiparesis affecting dominant side ([place]) 07/07/2014  . Shoulder joint pain 03/16/2014  . Chest pressure 02/03/2014  . Medicare annual wellness visit, subsequent 11/06/2013  . Erectile dysfunction due to arterial insufficiency 02/05/2013  . MDD (major depressive disorder), recurrent episode, moderate ([place])   . Palpitations 06/26/2012  . Hyperlipidemia associated with type 2 diabetes mellitus ([place])   . Restless leg syndrome   . GERD (gastroesophageal reflux disease)   . BPH with obstruction/lower urinary tract symptoms   . History of CVA (cerebrovascular accident) 03/11/2012  . Hemiparesis affecting right side as late effect of stroke ([place]) 03/11/2012  . Type 2 diabetes mellitus, uncontrolled, with neuropathy ([place]) 03/11/2012  . OSA on CPAP 03/11/2012  . CKD stage 3 secondary to diabetes ([place]) 03/11/2012  . Migraines 03/11/2012  . Thrombocytopenia ([place]) 2011      Have you seen any other providers since your last visit? Yes  08/31/2020- Dr. Delrae Rend- Endocrinology   Any changes in your medications or health? No   Any side effects from any medications? "Not that he is aware of"   Do you have an symptoms or problems not managed by your medications? No   Any concerns about your health right now? States his right side is weak after stroke, balance is off.     Has your provider asked that you check blood pressure, blood sugar, or follow special diet at home?  States he is supposed to check blood sugar daily. States he has freestyle Elenor Legato that he really likes.    Do you get any type of exercise on a regular basis? Not exercising on a daily basis but would like to   Can you think of a goal you would like to reach for your health? Wants to walk everyday.    Do you have any problems getting your medications?   Patient uses [facility] as his pharmacy. Denies any issues with medication   Is there anything that you would like to discuss during the appointment? Discuss exercising and balance issues.  Patient reminded to have all medications, supplements and any blood sugar and blood pressure readings available for review  with [name], Pharm. D, at their telephone visit on 10/13/20.     After discussing with [name] it would be a phone visit he requested to reschedule when he would be able to come in to the office. This appointment was rescheduled for 11/18/20 at 10:15 AM.  Follow-Up:  Pharmacist Review and Scheduled Follow-Up With Clinical Pharmacist   [name], CPP notified  Margaretmary Dys, [place] Pharmacy Assistant [phone]

## 2020-10-12 DIAGNOSIS — G4733 Obstructive sleep apnea (adult) (pediatric): Secondary | ICD-10-CM | POA: Diagnosis not present

## 2020-10-13 ENCOUNTER — Telehealth: Payer: Medicare Other

## 2020-10-15 ENCOUNTER — Other Ambulatory Visit: Payer: Self-pay

## 2020-10-18 ENCOUNTER — Other Ambulatory Visit: Payer: Self-pay

## 2020-10-18 ENCOUNTER — Ambulatory Visit (INDEPENDENT_AMBULATORY_CARE_PROVIDER_SITE_OTHER): Payer: Medicare PPO | Admitting: Family Medicine

## 2020-10-18 ENCOUNTER — Encounter: Payer: Self-pay | Admitting: Family Medicine

## 2020-10-18 VITALS — BP 134/78 | HR 82 | Temp 97.5°F | Ht 68.0 in | Wt 184.1 lb

## 2020-10-18 DIAGNOSIS — H6122 Impacted cerumen, left ear: Secondary | ICD-10-CM | POA: Insufficient documentation

## 2020-10-18 DIAGNOSIS — Z8673 Personal history of transient ischemic attack (TIA), and cerebral infarction without residual deficits: Secondary | ICD-10-CM

## 2020-10-18 DIAGNOSIS — H6121 Impacted cerumen, right ear: Secondary | ICD-10-CM

## 2020-10-18 DIAGNOSIS — N390 Urinary tract infection, site not specified: Secondary | ICD-10-CM | POA: Insufficient documentation

## 2020-10-18 DIAGNOSIS — I69398 Other sequelae of cerebral infarction: Secondary | ICD-10-CM

## 2020-10-18 DIAGNOSIS — IMO0002 Reserved for concepts with insufficient information to code with codable children: Secondary | ICD-10-CM

## 2020-10-18 DIAGNOSIS — I69351 Hemiplegia and hemiparesis following cerebral infarction affecting right dominant side: Secondary | ICD-10-CM | POA: Diagnosis not present

## 2020-10-18 DIAGNOSIS — E1140 Type 2 diabetes mellitus with diabetic neuropathy, unspecified: Secondary | ICD-10-CM | POA: Diagnosis not present

## 2020-10-18 DIAGNOSIS — Z7409 Other reduced mobility: Secondary | ICD-10-CM | POA: Diagnosis not present

## 2020-10-18 DIAGNOSIS — G8110 Spastic hemiplegia affecting unspecified side: Secondary | ICD-10-CM | POA: Diagnosis not present

## 2020-10-18 DIAGNOSIS — G2581 Restless legs syndrome: Secondary | ICD-10-CM

## 2020-10-18 DIAGNOSIS — R197 Diarrhea, unspecified: Secondary | ICD-10-CM | POA: Insufficient documentation

## 2020-10-18 DIAGNOSIS — E1165 Type 2 diabetes mellitus with hyperglycemia: Secondary | ICD-10-CM

## 2020-10-18 DIAGNOSIS — R110 Nausea: Secondary | ICD-10-CM

## 2020-10-18 DIAGNOSIS — R300 Dysuria: Secondary | ICD-10-CM | POA: Diagnosis not present

## 2020-10-18 DIAGNOSIS — R269 Unspecified abnormalities of gait and mobility: Secondary | ICD-10-CM

## 2020-10-18 DIAGNOSIS — R112 Nausea with vomiting, unspecified: Secondary | ICD-10-CM | POA: Insufficient documentation

## 2020-10-18 LAB — POC URINALSYSI DIPSTICK (AUTOMATED)
Bilirubin, UA: NEGATIVE
Glucose, UA: NEGATIVE
Ketones, UA: NEGATIVE
Nitrite, UA: NEGATIVE
Protein, UA: POSITIVE — AB
Spec Grav, UA: 1.025 (ref 1.010–1.025)
Urobilinogen, UA: 0.2 E.U./dL
pH, UA: 5.5 (ref 5.0–8.0)

## 2020-10-18 MED ORDER — CEPHALEXIN 500 MG PO CAPS: 500.0000 mg | ORAL_CAPSULE | Freq: Two times a day (BID) | ORAL | 0 refills | Status: DC

## 2020-10-18 NOTE — Assessment & Plan Note (Signed)
Followed by endo [name]), has upcoming endo appt tomorrow.

## 2020-10-18 NOTE — Progress Notes (Signed)
Patient ID: James Spurr, MD, male    DOB: 08-07-1931, 87 y.o.   MRN: [id number]  This visit was conducted in person.  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 ), Dysuria (C/o burning when urinating and cloudy urine.  Sxs started about 4 days ago. ), and Nausea (C/o nausea off and on.  Thinks may be due to a med.  Vomited in office today and now c/o HA. )   Right handed  Known h/o L subcortical infarct with resultant chronic R hemiparesis.  Diabetic on insulin and ozempic - endo f/u scheduled for tomorrow [name])  He has been using old power wheelchair at home for the last several years but it's breaking down. Notes increasing imbalance - has had several falls recently due to this. Uses power wheelchair at night to go to the bathroom - when he is more unsteady. Also uses it during the day for MRADLs including preparing meals in the kitchen, eating meals in the living room.  Limited walking with hemi walker, insufficient to meet needs for MRADLs due to hemiparesis. Cannot use manual wheelchair due to R arm weakness. Cannot use scooter due to postural instability and difficulty with transfers onto scooter.  Patient has sufficient mental and physical ability to safely operate power wheelchair. Using a power wheelchair will significantly improve his ability to participate in MRADLs and he is willing and able to use PWC at home.   Lives in [place] in 1 level condo - has enough space to move around but may require smaller PWC due to space limitations. Doesn't have stairs to navigate.   Last completed PT course several months ago.   Episode of emesis in  office today with residual HA. Wonders if med related (gabapentin vs oxycodone). No recent med changes. Last ozempic dose was last night - had been off for 2 wks (ran out).   By the way - 4d h/o dysuria, cloudy urine and frequency. Notes increased nocturia. No abd pain, flank pain, fevers/chills. Some nausea/vomiting (see below)  Overdue for f/u with Dr Letta Pate     Relevant past medical, surgical, family and social history reviewed and updated as indicated. Interim medical history since our last visit reviewed. Allergies and medications reviewed and updated. Outpatient Medications Prior to Visit  Medication Sig Dispense Refill  . Alpha-Lipoic Acid 600 MG CAPS Take 1 capsule (600 mg total) by mouth daily. 30 capsule 11  . atorvastatin (LIPITOR) 40 MG tablet Take 40 mg by mouth daily.    . celecoxib (CELEBREX) 200 MG capsule Take 1 capsule (200 mg total) by mouth daily. 90 capsule 1  . clopidogrel (PLAVIX) 75 MG tablet Take 1 tablet (75 mg total) by mouth daily. 90 tablet 3  . Coenzyme Q10 (COQ10) 150 MG CAPS Take 1 capsule by mouth 2 (two) times daily.    . diclofenac Sodium (VOLTAREN) 1 % GEL Apply 2 g topically 3 (three) times daily as needed. (Patient taking differently: Apply 2 g topically 3 (three) times daily as needed (pain).) 100 g 1  . DULoxetine (  CYMBALTA) 60 MG capsule TAKE 1 CAPSULE DAILY 90 capsule 3  . famotidine (PEPCID) 20 MG tablet Take 1 tablet (20 mg total) by mouth at bedtime.    . gabapentin (NEURONTIN) 300 MG capsule Take 2 capsules (600 mg total) by mouth 2 (two) times daily.    . insulin degludec (TRESIBA FLEXTOUCH) 200 UNIT/ML FlexTouch Pen Inject 90 Units into the skin at bedtime. As needed    . insulin regular (NOVOLIN R) 100 units/mL injection Inject 0.04 mLs (4 Units total) into the skin 3 (three) times daily before meals.    Marland Kitchen l-methylfolate-B6-B12 (METANX) 3-35-2 MG TABS tablet Take 1 tablet by mouth daily. 30 tablet 0  . levothyroxine (SYNTHROID) 50 MCG tablet  Take 1 tablet (50 mcg total) by mouth daily before breakfast. Per endo [name]    . LUMIGAN 0.01 % SOLN Place 1 drop into the right eye at bedtime.    . metoprolol tartrate (LOPRESSOR) 25 MG tablet TAKE ONE-HALF (1/2) TABLET (12.5 MG) AT BEDTIME WITH MORNING DOSE IF NEEDED (Patient taking differently: Take 12.5 mg by mouth See admin instructions. TAKE ONE-HALF (1/2) TABLET (12.5 MG) AT BEDTIME WITH MORNING DOSE IF NEEDED) 90 tablet 3  . nitroGLYCERIN (NITROSTAT) 0.4 MG SL tablet DISSOLVE 1 TABLET UNDER THE TONGUE EVERY 5 MINUTES AS NEEDED FOR CHEST PAIN (Patient taking differently: Place 0.4 mg under the tongue every 5 (five) minutes as needed for chest pain.) 25 tablet 1  . Omega-3 Fatty Acids (FISH OIL) 1000 MG CAPS Take 1 capsule (1,000 mg total) by mouth in the morning and at bedtime.  0  . ondansetron (ZOFRAN) 4 MG tablet Take 1 tablet (4 mg total) by mouth every 8 (eight) hours as needed for nausea or vomiting. 20 tablet 0  . oxyCODONE (OXY IR/ROXICODONE) 5 MG immediate release tablet Take 1 tablet (5 mg total) by mouth 2 (two) times daily as needed. rls 20 tablet 0  . pramipexole (MIRAPEX) 1 MG tablet Take 1 tablet (1 mg total) by mouth at bedtime.    . RABEprazole (ACIPHEX) 20 MG tablet TAKE 1 TABLET DAILY 90 tablet 3  . rosuvastatin (CRESTOR) 40 MG tablet Take 1 tablet (40 mg total) by mouth daily. 90 tablet 3  . Semaglutide, 1 MG/DOSE, (OZEMPIC, 1 MG/DOSE,) 2 MG/1.5ML SOPN 1 mg weekly    . traZODone (DESYREL) 50 MG tablet Take 50 mg by mouth at bedtime as needed for sleep.    . valsartan (DIOVAN) 40 MG tablet Take 1 tablet (40 mg total) by mouth daily.    . vitamin C (ASCORBIC ACID) 500 MG tablet Take 1,000 mg by mouth daily.      No facility-administered medications prior to visit.     Per HPI unless specifically indicated in ROS section below Review of Systems Objective:  BP 134/78 (BP Location: Left Arm, Patient Position: Sitting, Cuff Size: Normal)   Pulse 82   Temp (!) 97.5 F  (36.4 C) (Temporal)   Ht 5\' 8"  (1.727 m)   Wt 184 lb 1 oz (83.5 kg)   SpO2 97%   BMI 27.99 kg/m   Wt Readings from Last 3 Encounters:  10/18/20 184 lb 1 oz (83.5 kg)  08/31/20 189 lb (85.7 kg)  08/03/20 187 lb 2 oz (84.9 kg)      Physical Exam Vitals and nursing note reviewed.  Constitutional:      Appearance: Normal appearance. He is ill-appearing (chronic).     Comments: Ambulates with hemiwalker  HENT:  Right Ear: Ear canal and external ear normal. Decreased hearing noted. There is impacted cerumen.     Left Ear: Hearing, tympanic membrane, ear canal and external ear normal.     Ears:     Comments: Irrigation performed unsuccessfully  Neck:     Vascular: No carotid bruit.  Cardiovascular:     Rate and Rhythm: Normal rate and regular rhythm.     Pulses: Normal pulses.     Heart sounds: Normal heart sounds. No murmur heard.   Pulmonary:     Effort: Pulmonary effort is normal. No respiratory distress.     Breath sounds: Normal breath sounds. No wheezing, rhonchi or rales.  Musculoskeletal:     Cervical back: Normal range of motion and neck supple.     Right lower leg: No edema.     Left lower leg: No edema.     Comments: R AFO in place  Lymphadenopathy:     Cervical: No cervical adenopathy.  Skin:    General: Skin is warm and dry.     Findings: No rash.  Neurological:     Mental Status: He is alert.     Sensory: Sensory deficit present.     Motor: Weakness present.     Coordination: Coordination abnormal.     Gait: Gait abnormal.     Comments:  Chronic R hemiparesis - 3/5 strength on right, 5/5 strength LUE, LLE Postural instability with sitting, standing Unsteady stance  Psychiatric:        Mood and Affect: Mood normal.        Behavior: Behavior normal.       Results for orders placed or performed in visit on 10/18/20  POCT Urinalysis Dipstick (Automated)  Result Value Ref Range   Color, UA yellow    Clarity, UA cloudy    Glucose, UA Negative  Negative   Bilirubin, UA negative    Ketones, UA negative    Spec Grav, UA 1.025 1.010 - 1.025   Blood, UA +/-    pH, UA 5.5 5.0 - 8.0   Protein, UA Positive (A) Negative   Urobilinogen, UA 0.2 0.2 or 1.0 E.U./dL   Nitrite, UA negative    Leukocytes, UA Large (3+) (A) Negative   Assessment & Plan:  This visit occurred during the SARS-CoV-2 public health emergency.  Safety protocols were in place, including screening questions prior to the visit, additional usage of staff PPE, and extensive cleaning of exam room while observing appropriate contact time as indicated for disinfecting solutions.   Over 45 minutes were spent face-to-face with the patient during this encounter or in coordination of care, reviewing past records, time spent documenting, lab results or radiology, or educating the patient or family.  Problem List Items Addressed This Visit    Type 2 diabetes mellitus, uncontrolled, with neuropathy ([place])    Followed by endo [name]), has upcoming endo appt tomorrow.       Spastic hemiparesis affecting dominant side ([place])    Overdue for f/u with PM&R - # provided to Dr Dianna Limbo office, he will let me know if new referral needed       Restless leg syndrome    Saw neurology, overall stable period on current regimen of mirapex 1mg  daily, gabapentin 600mg  bid, oxycodone 5mg  PRN. Continue.       Nausea    Nausea/vomiting - in setting of recently restarting full dose ozempic after missing several weeks of med. Anticipate med related, discussed with patient. If ongoing, suggested  dropping dose to 0.5mg  weekly and touching base with endo.  I also suggested he take 1/2 oxycodone for now as opiates are another common cause of nausea.       Impaired mobility - Primary    Mobility assessment completed.  Rx will be sent along with face to face encounter today.       History of CVA (cerebrovascular accident)    Continue statin, plavix.       Hemiparesis affecting right side as late  effect of stroke ([facility])    Residual R hemiparesis in right handed individual.       Hearing loss of right ear due to cerumen impaction    Failed irrigation today - will refer to ENT for cleaning/eval.       Relevant Orders   Ambulatory referral to ENT   Gait disturbance, post-stroke   Dysuria    UA, story consistent with UTI - treat with keflex 500mg  BID x 7 days, will send UCx.       Relevant Orders   POCT Urinalysis Dipstick (Automated) (Completed)   Urine Culture       Meds ordered this encounter  Medications  . cephALEXin (KEFLEX) 500 MG capsule    Sig: Take 1 capsule (500 mg total) by mouth 2 (two) times daily.    Dispense:  14 capsule    Refill:  0   Orders Placed This Encounter  Procedures  . Urine Culture  . Ambulatory referral to ENT    Referral Priority:   Routine    Referral Type:   Consultation    Referral Reason:   Specialty Services Required    Requested Specialty:   Otolaryngology    Number of Visits Requested:   1  . POCT Urinalysis Dipstick (Automated)    Patient Instructions  Ozempic likely caused vomiting /nausea today. If ongoing, you may need to drop dose to 0.5mg  weekly.  Decrease oxycodone to 1/2 tablet at a time. See if this will help nausea.  R ear irrigation today.  For possible UTI - start keflex twice daily for 7 days. Urine culture sent.  Call Dr Letta Pate office for new appointment, let me know if you do need new referral. [phone]  Follow up plan: Return if symptoms worsen or fail to improve.  [name], MD

## 2020-10-18 NOTE — Assessment & Plan Note (Signed)
Failed irrigation today - will refer to ENT for cleaning/eval.

## 2020-10-18 NOTE — Assessment & Plan Note (Signed)
Mobility assessment completed.  Rx will be sent along with face to face encounter today.

## 2020-10-18 NOTE — Assessment & Plan Note (Addendum)
Nausea/vomiting - in setting of recently restarting full dose ozempic after missing several weeks of med. Anticipate med related, discussed with patient. If ongoing, suggested dropping dose to 0.5mg  weekly and touching base with endo.  I also suggested he take 1/2 oxycodone for now as opiates are another common cause of nausea.

## 2020-10-18 NOTE — Patient Instructions (Addendum)
Ozempic likely caused vomiting /nausea today. If ongoing, you may need to drop dose to 0.5mg  weekly.  Decrease oxycodone to 1/2 tablet at a time. See if this will help nausea.  R ear irrigation today.  For possible UTI - start keflex twice daily for 7 days. Urine culture sent.  Call Dr Letta Pate office for new appointment, let me know if you do need new referral. [phone]

## 2020-10-18 NOTE — Assessment & Plan Note (Signed)
Continue statin, plavix.  

## 2020-10-18 NOTE — Assessment & Plan Note (Signed)
UA, story consistent with UTI - treat with keflex 500mg  BID x 7 days, will send UCx.

## 2020-10-18 NOTE — Assessment & Plan Note (Signed)
Residual R hemiparesis in right handed individual.

## 2020-10-18 NOTE — Assessment & Plan Note (Addendum)
Saw neurology, overall stable period on current regimen of mirapex 1mg  daily, gabapentin 600mg  bid, oxycodone 5mg  PRN. Continue.

## 2020-10-18 NOTE — Assessment & Plan Note (Signed)
Overdue for f/u with PM&R - # provided to Dr Dianna Limbo office, he will let me know if new referral needed

## 2020-10-19 DIAGNOSIS — E039 Hypothyroidism, unspecified: Secondary | ICD-10-CM | POA: Diagnosis not present

## 2020-10-20 ENCOUNTER — Telehealth: Payer: Self-pay

## 2020-10-20 LAB — URINE CULTURE
MICRO NUMBER:: 11475971
SPECIMEN QUALITY:: ADEQUATE

## 2020-10-20 NOTE — Telephone Encounter (Signed)
I sent [facility] yesterday. I have not received anything further from [employer]. It may be up front.

## 2020-10-20 NOTE — Telephone Encounter (Signed)
James Bell with [employer] calling to verify received detailed product description document that was faxed to [phone] on 10/19/20. James Bell request cb and ref# [id number]. Sending to Dr Darnell Level and Lattie Haw CMA. Pt seen for mobility by Dr Darnell Level on 10/18/20.

## 2020-10-21 NOTE — Telephone Encounter (Addendum)
Spoke with Elmyra Ricks, of [employer], notifying her we have not received faxed document.  She confirmed our correct fax #.  She is faxing form today.

## 2020-10-22 NOTE — Telephone Encounter (Signed)
Spoke with Katharine Look of [employer] informing her we still have not received faxed form.  I provided fax # [phone] (Donna[employer]) to see if it will come through.  Says she will fax info shortly.

## 2020-10-22 NOTE — Telephone Encounter (Signed)
James Bell with [employer] left v/m to verify received detailed product description requested faxed to office on 12/19/20. Ref # [id number]. James Bell needs Dr Darnell Level to sign and date document with NPI # and fax to [phone].

## 2020-10-22 NOTE — Telephone Encounter (Signed)
I have not received this yet

## 2020-10-22 NOTE — Telephone Encounter (Signed)
I have yet to see the fax.  Dr. Darnell Level, did you by chance get this form?

## 2020-10-25 ENCOUNTER — Other Ambulatory Visit: Payer: Self-pay

## 2020-10-25 ENCOUNTER — Telehealth: Payer: Self-pay | Admitting: Family Medicine

## 2020-10-25 MED ORDER — BD PEN NEEDLE NANO 2ND GEN 32G X 4 MM MISC: 1.0000 | Freq: Every day | 0 refills | Status: DC

## 2020-10-25 NOTE — Telephone Encounter (Signed)
James Bell with [employer] left a voicemail wanting to know if you received the detailed product description form that she re-faxed to you last week. Ref #[id number] Fax # [phone]

## 2020-10-25 NOTE — Telephone Encounter (Signed)
I still have not received anything.

## 2020-10-25 NOTE — Telephone Encounter (Signed)
Pt called in due to he was told by Dr. Darnell Level to call and let him know how to medication is working and its not working, and he needs  A new prescription for diabetic needle.  [facility]ara[facility]: [email or web address]

## 2020-10-26 MED ORDER — SULFAMETHOXAZOLE-TRIMETHOPRIM 800-160 MG PO TABS: 1.0000 | ORAL_TABLET | Freq: Two times a day (BID) | ORAL | 0 refills | Status: DC

## 2020-10-26 NOTE — Addendum Note (Signed)
Addended by: [name] on: 10/26/2020 01:33 PM   Modules accepted: Orders

## 2020-10-26 NOTE — Telephone Encounter (Signed)
Spoke with pt relaying Dr. Synthia Innocent message.  Pt verbalizes understanding.  Says he's doing better but still has a little discomfort when urinating and lingering dribble after finishing.

## 2020-10-26 NOTE — Telephone Encounter (Addendum)
Please clarify what medicine he is referring to - keflex for UTI? ozempic causing nausea? Oxycodone causing nausea? If ozempic, what did endo recommend for this?  Please send in diabetic needles.

## 2020-10-26 NOTE — Telephone Encounter (Signed)
Keflex- he said he would get a stronger dosage due to the one he has now is not working.

## 2020-10-26 NOTE — Telephone Encounter (Signed)
plz notify I've sent in bactrim to take twice daily for 1 week. plz get update on what symptoms he is currently having.

## 2020-10-27 ENCOUNTER — Telehealth: Payer: Self-pay | Admitting: Family Medicine

## 2020-10-27 NOTE — Telephone Encounter (Signed)
Sandy from [employer] called and asked if we received a fax for the powerchair, informed her that we havent received , she is going to email it and fax it again

## 2020-10-27 NOTE — Telephone Encounter (Signed)
And he wants the PT to be from a Dr. [place]

## 2020-10-27 NOTE — Telephone Encounter (Signed)
Pt called in wantd to know about the referral for rehab.

## 2020-10-27 NOTE — Telephone Encounter (Signed)
Finally received today, signed and in [name]'s box.

## 2020-10-27 NOTE — Telephone Encounter (Signed)
Please call to clarify - was overdue for f/u with PM&R - # provided during OV to Dr Dianna Limbo office in [place] to call for appointment.

## 2020-10-28 NOTE — Telephone Encounter (Signed)
Spoke with pt asking for clarification of his message.  States he was wanting to get set up with a doctor in [place] to cut down on travel.  However, during our conversation, pt states he will stick with Dr. Letta Pate since he already has pt's records.  FYI to Dr. Darnell Level.

## 2020-10-28 NOTE — Telephone Encounter (Signed)
Faxed form.

## 2020-10-28 NOTE — Telephone Encounter (Signed)
Then I don't think he needs new referral but just to call and schedule appt.

## 2020-10-28 NOTE — Telephone Encounter (Addendum)
Spoke with pt relaying Dr. Synthia Innocent message.  Pt verbalizes understanding and will call them for an appt.

## 2020-11-02 ENCOUNTER — Emergency Department (HOSPITAL_COMMUNITY): Payer: Medicare Other

## 2020-11-02 ENCOUNTER — Other Ambulatory Visit: Payer: Self-pay

## 2020-11-02 ENCOUNTER — Inpatient Hospital Stay (HOSPITAL_COMMUNITY)
Admission: EM | Admit: 2020-11-02 | Discharge: 2020-11-05 | DRG: 123 | Disposition: A | Discharge status: Home Health | Payer: Medicare Other | Attending: Internal Medicine | Admitting: Internal Medicine

## 2020-11-02 DIAGNOSIS — Z743 Need for continuous supervision: Secondary | ICD-10-CM | POA: Diagnosis not present

## 2020-11-02 DIAGNOSIS — I6389 Other cerebral infarction: Secondary | ICD-10-CM | POA: Diagnosis not present

## 2020-11-02 DIAGNOSIS — R339 Retention of urine, unspecified: Secondary | ICD-10-CM | POA: Diagnosis present

## 2020-11-02 DIAGNOSIS — E1122 Type 2 diabetes mellitus with diabetic chronic kidney disease: Secondary | ICD-10-CM | POA: Diagnosis present

## 2020-11-02 DIAGNOSIS — G4489 Other headache syndrome: Secondary | ICD-10-CM | POA: Diagnosis not present

## 2020-11-02 DIAGNOSIS — R627 Adult failure to thrive: Secondary | ICD-10-CM | POA: Diagnosis present

## 2020-11-02 DIAGNOSIS — N401 Enlarged prostate with lower urinary tract symptoms: Secondary | ICD-10-CM | POA: Diagnosis present

## 2020-11-02 DIAGNOSIS — W19XXXA Unspecified fall, initial encounter: Secondary | ICD-10-CM

## 2020-11-02 DIAGNOSIS — Z823 Family history of stroke: Secondary | ICD-10-CM

## 2020-11-02 DIAGNOSIS — R531 Weakness: Secondary | ICD-10-CM | POA: Diagnosis not present

## 2020-11-02 DIAGNOSIS — N183 Chronic kidney disease, stage 3 unspecified: Secondary | ICD-10-CM | POA: Diagnosis present

## 2020-11-02 DIAGNOSIS — Z79899 Other long term (current) drug therapy: Secondary | ICD-10-CM

## 2020-11-02 DIAGNOSIS — R55 Syncope and collapse: Secondary | ICD-10-CM

## 2020-11-02 DIAGNOSIS — N390 Urinary tract infection, site not specified: Secondary | ICD-10-CM | POA: Diagnosis not present

## 2020-11-02 DIAGNOSIS — E1165 Type 2 diabetes mellitus with hyperglycemia: Secondary | ICD-10-CM | POA: Diagnosis present

## 2020-11-02 DIAGNOSIS — R296 Repeated falls: Secondary | ICD-10-CM | POA: Diagnosis present

## 2020-11-02 DIAGNOSIS — Z833 Family history of diabetes mellitus: Secondary | ICD-10-CM

## 2020-11-02 DIAGNOSIS — E11649 Type 2 diabetes mellitus with hypoglycemia without coma: Secondary | ICD-10-CM | POA: Diagnosis not present

## 2020-11-02 DIAGNOSIS — N1832 Chronic kidney disease, stage 3b: Secondary | ICD-10-CM | POA: Diagnosis present

## 2020-11-02 DIAGNOSIS — Z6828 Body mass index (BMI) 28.0-28.9, adult: Secondary | ICD-10-CM | POA: Diagnosis not present

## 2020-11-02 DIAGNOSIS — G4733 Obstructive sleep apnea (adult) (pediatric): Secondary | ICD-10-CM | POA: Diagnosis not present

## 2020-11-02 DIAGNOSIS — E785 Hyperlipidemia, unspecified: Secondary | ICD-10-CM | POA: Diagnosis present

## 2020-11-02 DIAGNOSIS — R4781 Slurred speech: Secondary | ICD-10-CM | POA: Diagnosis not present

## 2020-11-02 DIAGNOSIS — N281 Cyst of kidney, acquired: Secondary | ICD-10-CM | POA: Diagnosis present

## 2020-11-02 DIAGNOSIS — R4701 Aphasia: Secondary | ICD-10-CM | POA: Diagnosis not present

## 2020-11-02 DIAGNOSIS — G928 Other toxic encephalopathy: Secondary | ICD-10-CM | POA: Diagnosis present

## 2020-11-02 DIAGNOSIS — Z841 Family history of disorders of kidney and ureter: Secondary | ICD-10-CM

## 2020-11-02 DIAGNOSIS — Z79890 Hormone replacement therapy (postmenopausal): Secondary | ICD-10-CM

## 2020-11-02 DIAGNOSIS — R471 Dysarthria and anarthria: Secondary | ICD-10-CM | POA: Diagnosis not present

## 2020-11-02 DIAGNOSIS — G9340 Encephalopathy, unspecified: Secondary | ICD-10-CM | POA: Diagnosis present

## 2020-11-02 DIAGNOSIS — I447 Left bundle-branch block, unspecified: Secondary | ICD-10-CM | POA: Diagnosis not present

## 2020-11-02 DIAGNOSIS — Z85828 Personal history of other malignant neoplasm of skin: Secondary | ICD-10-CM

## 2020-11-02 DIAGNOSIS — Z794 Long term (current) use of insulin: Secondary | ICD-10-CM

## 2020-11-02 DIAGNOSIS — R410 Disorientation, unspecified: Secondary | ICD-10-CM

## 2020-11-02 DIAGNOSIS — G2581 Restless legs syndrome: Secondary | ICD-10-CM | POA: Diagnosis not present

## 2020-11-02 DIAGNOSIS — I440 Atrioventricular block, first degree: Secondary | ICD-10-CM | POA: Diagnosis not present

## 2020-11-02 DIAGNOSIS — R7989 Other specified abnormal findings of blood chemistry: Secondary | ICD-10-CM | POA: Diagnosis present

## 2020-11-02 DIAGNOSIS — I129 Hypertensive chronic kidney disease with stage 1 through stage 4 chronic kidney disease, or unspecified chronic kidney disease: Secondary | ICD-10-CM | POA: Diagnosis not present

## 2020-11-02 DIAGNOSIS — I499 Cardiac arrhythmia, unspecified: Secondary | ICD-10-CM | POA: Diagnosis not present

## 2020-11-02 DIAGNOSIS — F331 Major depressive disorder, recurrent, moderate: Secondary | ICD-10-CM | POA: Diagnosis present

## 2020-11-02 DIAGNOSIS — Z20822 Contact with and (suspected) exposure to covid-19: Secondary | ICD-10-CM | POA: Diagnosis present

## 2020-11-02 DIAGNOSIS — Z7189 Other specified counseling: Secondary | ICD-10-CM

## 2020-11-02 DIAGNOSIS — E039 Hypothyroidism, unspecified: Secondary | ICD-10-CM | POA: Diagnosis present

## 2020-11-02 DIAGNOSIS — H532 Diplopia: Secondary | ICD-10-CM | POA: Diagnosis not present

## 2020-11-02 DIAGNOSIS — N1831 Chronic kidney disease, stage 3a: Secondary | ICD-10-CM | POA: Diagnosis present

## 2020-11-02 DIAGNOSIS — Z7902 Long term (current) use of antithrombotics/antiplatelets: Secondary | ICD-10-CM

## 2020-11-02 DIAGNOSIS — K219 Gastro-esophageal reflux disease without esophagitis: Secondary | ICD-10-CM | POA: Diagnosis not present

## 2020-11-02 DIAGNOSIS — I69351 Hemiplegia and hemiparesis following cerebral infarction affecting right dominant side: Secondary | ICD-10-CM

## 2020-11-02 DIAGNOSIS — I10 Essential (primary) hypertension: Secondary | ICD-10-CM | POA: Diagnosis present

## 2020-11-02 DIAGNOSIS — N179 Acute kidney failure, unspecified: Secondary | ICD-10-CM | POA: Diagnosis present

## 2020-11-02 DIAGNOSIS — E860 Dehydration: Secondary | ICD-10-CM | POA: Diagnosis not present

## 2020-11-02 DIAGNOSIS — G319 Degenerative disease of nervous system, unspecified: Secondary | ICD-10-CM | POA: Diagnosis not present

## 2020-11-02 DIAGNOSIS — Z791 Long term (current) use of non-steroidal anti-inflammatories (NSAID): Secondary | ICD-10-CM

## 2020-11-02 DIAGNOSIS — I4430 Unspecified atrioventricular block: Secondary | ICD-10-CM | POA: Diagnosis not present

## 2020-11-02 DIAGNOSIS — E1140 Type 2 diabetes mellitus with diabetic neuropathy, unspecified: Secondary | ICD-10-CM | POA: Diagnosis present

## 2020-11-02 DIAGNOSIS — Z888 Allergy status to other drugs, medicaments and biological substances status: Secondary | ICD-10-CM

## 2020-11-02 LAB — COMPREHENSIVE METABOLIC PANEL
ALT: 16 U/L (ref 0–44)
AST: 24 U/L (ref 15–41)
Albumin: 3.1 g/dL — ABNORMAL LOW (ref 3.5–5.0)
Alkaline Phosphatase: 39 U/L (ref 38–126)
Anion gap: 7 (ref 5–15)
BUN: 29 mg/dL — ABNORMAL HIGH (ref 8–23)
CO2: 22 mmol/L (ref 22–32)
Calcium: 8.1 mg/dL — ABNORMAL LOW (ref 8.9–10.3)
Chloride: 105 mmol/L (ref 98–111)
Creatinine, Ser: 1.78 mg/dL — ABNORMAL HIGH (ref 0.61–1.24)
GFR, Estimated: 36 mL/min — ABNORMAL LOW (ref >60)
Glucose, Bld: 194 mg/dL — ABNORMAL HIGH (ref 70–99)
Potassium: 4.9 mmol/L (ref 3.5–5.1)
Sodium: 134 mmol/L — ABNORMAL LOW (ref 135–145)
Total Bilirubin: 0.2 mg/dL — ABNORMAL LOW (ref 0.3–1.2)
Total Protein: 6.4 g/dL — ABNORMAL LOW (ref 6.5–8.1)

## 2020-11-02 LAB — SAMPLE TO BLOOD BANK

## 2020-11-02 LAB — CBG MONITORING, ED
Glucose-Capillary: 163 mg/dL — ABNORMAL HIGH (ref 70–99)
Glucose-Capillary: 61 mg/dL — ABNORMAL LOW (ref 70–99)
Glucose-Capillary: 93 mg/dL (ref 70–99)

## 2020-11-02 LAB — I-STAT VENOUS BLOOD GAS, ED
Acid-base deficit: 3 mmol/L — ABNORMAL HIGH (ref 0.0–2.0)
Bicarbonate: 20.9 mmol/L (ref 20.0–28.0)
Calcium, Ion: 1.09 mmol/L — ABNORMAL LOW (ref 1.15–1.40)
HCT: 37 % — ABNORMAL LOW (ref 39.0–52.0)
Hemoglobin: 12.6 g/dL — ABNORMAL LOW (ref 13.0–17.0)
O2 Saturation: 97 %
Potassium: 4.4 mmol/L (ref 3.5–5.1)
Sodium: 139 mmol/L (ref 135–145)
TCO2: 22 mmol/L (ref 22–32)
pCO2, Ven: 33.1 mmHg — ABNORMAL LOW (ref 44.0–60.0)
pH, Ven: 7.409 (ref 7.250–7.430)
pO2, Ven: 92 mmHg — ABNORMAL HIGH (ref 32.0–45.0)

## 2020-11-02 LAB — CBC
HCT: 36.9 % — ABNORMAL LOW (ref 39.0–52.0)
Hemoglobin: 11.9 g/dL — ABNORMAL LOW (ref 13.0–17.0)
MCH: 30.1 pg (ref 26.0–34.0)
MCHC: 32.2 g/dL (ref 30.0–36.0)
MCV: 93.4 fL (ref 80.0–100.0)
Platelets: 173 10*3/uL (ref 150–400)
RBC: 3.95 MIL/uL — ABNORMAL LOW (ref 4.22–5.81)
RDW: 12.3 % (ref 11.5–15.5)
WBC: 9.5 10*3/uL (ref 4.0–10.5)
nRBC: 0 % (ref 0.0–0.2)

## 2020-11-02 LAB — LACTIC ACID, PLASMA: Lactic Acid, Venous: 1 mmol/L (ref 0.5–1.9)

## 2020-11-02 LAB — URINALYSIS, ROUTINE W REFLEX MICROSCOPIC
Bilirubin Urine: NEGATIVE
Glucose, UA: NEGATIVE mg/dL
Hgb urine dipstick: NEGATIVE
Ketones, ur: NEGATIVE mg/dL
Leukocytes,Ua: NEGATIVE
Nitrite: NEGATIVE
Protein, ur: NEGATIVE mg/dL
Specific Gravity, Urine: 1.005 (ref 1.005–1.030)
pH: 5 (ref 5.0–8.0)

## 2020-11-02 LAB — ETHANOL: Alcohol, Ethyl (B): <10 mg/dL (ref <10)

## 2020-11-02 LAB — SARS CORONAVIRUS 2 (TAT 6-24 HRS): SARS Coronavirus 2: NEGATIVE

## 2020-11-02 LAB — PROTIME-INR
INR: 1.1 (ref 0.8–1.2)
Prothrombin Time: 14.2 seconds (ref 11.4–15.2)

## 2020-11-02 MED ORDER — ACETAMINOPHEN 325 MG PO TABS: 650.0000 mg | ORAL_TABLET | Freq: Four times a day (QID) | ORAL | Status: DC | PRN

## 2020-11-02 MED ORDER — LORAZEPAM 2 MG/ML IJ SOLN
0.5000 mg | Freq: Once | INTRAMUSCULAR | Status: AC
  Administered 2020-11-02: 0.5 mg via INTRAVENOUS
  Filled 2020-11-02: qty 1

## 2020-11-02 MED ORDER — LACTATED RINGERS IV BOLUS
1000.0000 mL | Freq: Once | INTRAVENOUS | Status: AC
  Administered 2020-11-02: 1000 mL via INTRAVENOUS

## 2020-11-02 MED ORDER — SODIUM CHLORIDE 0.9 % IV SOLN: INTRAVENOUS | Status: AC

## 2020-11-02 MED ORDER — GABAPENTIN 300 MG PO CAPS
600.0000 mg | ORAL_CAPSULE | Freq: Once | ORAL | Status: AC
  Administered 2020-11-02: 600 mg via ORAL
  Filled 2020-11-02: qty 2

## 2020-11-02 MED ORDER — LORAZEPAM 2 MG/ML IJ SOLN
1.0000 mg | Freq: Once | INTRAMUSCULAR | Status: AC
  Administered 2020-11-02: 1 mg via INTRAVENOUS
  Filled 2020-11-02: qty 1

## 2020-11-02 MED ORDER — ACETAMINOPHEN 650 MG RE SUPP: 650.0000 mg | Freq: Four times a day (QID) | RECTAL | Status: DC | PRN

## 2020-11-02 MED ORDER — HEPARIN SODIUM (PORCINE) 5000 UNIT/ML IJ SOLN
5000.0000 [IU] | Freq: Three times a day (TID) | INTRAMUSCULAR | Status: DC
  Administered 2020-11-02 – 2020-11-05 (×8): 5000 [IU] via SUBCUTANEOUS
  Filled 2020-11-02 (×9): qty 1

## 2020-11-02 NOTE — ED Notes (Signed)
Pt has pulled out 2 of their IV's. MD aware.

## 2020-11-02 NOTE — H&P (Addendum)
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 male with medical history significant of CKD stage III, CVA with residual right hemiparesis, depression, GERD, BPH, hypertension, hyperlipidemia, OSA on CPAP, insulin-dependent type 2 diabetes, RLS presented to the ED via EMS for evaluation of diplopia.  Patient was recently treated for UTI.  Family reported slurred speech which began this morning.  Patient presenting with multiple days of generalized fatigue, minimal confusion, and fell asleep today while he was on the toilet, fell asleep and hit his head but unclear if it was a syncopal event.  In the ED, vital signs stable.  Labs showing WBC 9.5, hemoglobin 11.9, hematocrit 36.9, platelet count 173K.  Hemoglobin 12.6 on repeat labs.  Sodium 134, potassium 4.9, chloride 105, bicarb 22, BUN 29, creatinine 1.7, glucose 194.  Blood ethanol level undetectable.  INR 1.1.  Lactic acid 1.0.  UA not suggestive of infection.  SARS-CoV-2 PCR test pending.  Chest x-ray showing no active disease.  Head CT negative for acute intracranial abnormality.  Brain MRI (motion degraded exam) showing no acute infarct.  Patient's mental status was initially normal in the ED and he was able to answer questions.  After he was given his home gabapentin and Ativan prior to MRI, he became more confused and restless.  CBG 61 and he was given juice.  VBG with pH 7.40, PCO2 33.  Patient is AAO x3 but confused and very restless.  Reports having double vision.  History provided mostly by daughter at bedside who states that patient started having double vision today but he has had this problem in the past as well.  States he was not confused when she spoke to him on Sunday but today when she spoke to the patient his speech was slurred.  Daughter states patient  told her that for the past 2 days he has had multiple episodes of falling asleep while sitting.  He has not been eating much for the past few days.  Review of Systems:  All systems reviewed and apart from history of presenting illness, are negative.  Past Medical History:  Diagnosis Date  . Anemia   . Basal cell carcinoma 2018   R temple   . Basal cell carcinoma of nose 2002   bridge of nose  . BPH with obstruction/lower urinary tract symptoms    failed flomax, uroxatral, myrbetriq - sees urology [name]) pending videourodynamics  . CKD (chronic kidney disease) stage 3, GFR 30-59 ml/min (HCC) 03/11/2012   Renal US 02/2014 - multiple kidney cysts   . CVA (cerebral infarction) 03/07/12   slurred speech; right hemplegia, left handed - completed PT 07/2014 (17 sessions)  . Depression    mild, on cymbalta per prior PCP  . Erectile dysfunction    per urology (prostaglandin injection)  . GERD (gastroesophageal reflux disease)    with sliding HH  . H/O hiatal hernia   . History of kidney problems 12/2010   lacerated left kidney after fall  . HLD (hyperlipidemia)   . Hx of basal cell carcinoma 07/10/2017   R. lat. forehead near hair line  . Kidney cysts 02/2014   bilateral (renal US)  . Migraines 03/11/12   now rare  . OSA on CPAP    sleep study 01/2014 - severe, CPAP at 10, previously on nuvigil (Dr. Donnelly Angelica  [place])  . Palpitations 01/2012   holter WNL, rare PAC/PVCs  . Presbycusis of both ears 2016   rec amplification [facility])  . Restless leg syndrome    well controlled on mirapex  . Rosacea    [place]  . Stroke ([place])   . TIA (transient ischemic attack)   . Type 2 diabetes, controlled, with neuropathy [facility]) 2011   [name] in [facility]    Past Surgical History:  Procedure Laterality Date  . ABI  2007   WNL  . CARDIAC CATHETERIZATION  2004   normal; [place]  . CARDIOVASCULAR STRESS TEST  02/2014   WNL, low risk scan, EF 68%  . CAROTID ENDARTERECTOMY    . CATARACT EXTRACTION W/  INTRAOCULAR LENS IMPLANT  11//2012 - 08/2011  . COLONOSCOPY  03/2011   poor prep, unable to biopsy polyp in tic fold, rpt 3 mo [name])  . COLONOSCOPY  06/2011   mult polyps adenomatous, severe diverticulosis, rpt 3 yrs [name])  . CYSTOSCOPY WITH URETHRAL DILATATION N/A 07/24/2013   Procedure: CYSTOSCOPY WITH URETHRAL DILATATION;  Surgeon: [name], MD;  Location: [facility];  Service: Urology;  Laterality: N/A;  . ESOPHAGOGASTRODUODENOSCOPY  2006   duodenitis, medual HH, Grade 2 reflux, mild gastritis  . ESOPHAGOGASTRODUODENOSCOPY  07/2011   mild chronic gastritis  . [place]  . [place]; 2002   left, then repaired  . MRI brain  04/2010   WNL  . PROSTATE SURGERY  02/2009   PVP (laser)  . SKIN CANCER EXCISION  2002   bridge of nose, [facility]  . TONSILLECTOMY AND ADENOIDECTOMY  ~ 1945  . TRANSURETHRAL INCISION OF PROSTATE N/A 07/24/2013   Procedure: TRANSURETHRAL INCISION OF THE PROSTATE (TUIP);  Surgeon: [name], MD;  Location: [facility];  Service: Urology;  Laterality: N/A;  . URETHRAL DILATION  2014   [place]     reports that he has never smoked. He has never used smokeless tobacco. He reports current alcohol use of about 4.0 standard drinks of alcohol per week. He reports that he does not use drugs.  Allergies  Allergen Reactions  . Crestor [Rosuvastatin]     Other reaction(s): muscle pain  . Dulaglutide     Other reaction(s): upset stomach  . Empagliflozin     Other reaction(s): yeast infection  . Gabapentin Other (See Comments)    At high doses - BLURRED VISION, BALLANCE, CONCENTRATION PROBLEMS, RESTLESSNESS, NOT SLEEPING, ? LOST OF STRENGTH, UNSTEADINESS  . Lisinopril Cough    tachycardia  . Nexium [Esomeprazole] Other (See Comments)    Headache  . Niacin And Related Other (See Comments)    flushing  . Pregabalin     Other reaction(s): MALAISE, SLIGHT NAUSEA, SLIGHT HEADACHE    Family History  Problem Relation Age  of Onset  . Stroke Father   . Lung cancer Father 64        smoker  . Stroke Brother   . Diabetes Brother 92  . Lung cancer Brother   . Alcoholism Sister   . Kidney disease Sister   . Coronary artery disease Neg Hx     Prior to Admission medications   Medication Sig Start Date End Date Taking? Authorizing Provider  Alpha-Lipoic Acid 600 MG CAPS Take 1 capsule (600 mg total) by mouth daily. 12/07/19   [name], MD  atorvastatin (LIPITOR) 40 MG tablet Take 40 mg by mouth daily.    [provider]  BD PEN  NEEDLE NANO 2ND GEN 32G X 4 MM MISC 1 each by Other route daily. 10/25/20   [name], MD  celecoxib (CELEBREX) 200 MG capsule Take 1 capsule (200 mg total) by mouth daily. 06/16/20   Copland, Frederico Hamman, MD  cephALEXin (KEFLEX) 500 MG capsule Take 1 capsule (500 mg total) by mouth 2 (two) times daily. 10/18/20   [name], MD  clopidogrel (PLAVIX) 75 MG tablet Take 1 tablet (75 mg total) by mouth daily. 01/13/20   [name], MD  Coenzyme Q10 (COQ10) 150 MG CAPS Take 1 capsule by mouth 2 (two) times daily.    [provider]  diclofenac Sodium (VOLTAREN) 1 % GEL Apply 2 g topically 3 (three) times daily as needed. Patient taking differently: Apply 2 g topically 3 (three) times daily as needed (pain). 06/04/20   [name], MD  DULoxetine (CYMBALTA) 60 MG capsule TAKE 1 CAPSULE DAILY 08/03/20   [name], MD  famotidine (PEPCID) 20 MG tablet Take 1 tablet (20 mg total) by mouth at bedtime. 07/16/19   [name], MD  gabapentin (NEURONTIN) 300 MG capsule Take 2 capsules (600 mg total) by mouth 2 (two) times daily. 06/01/20   [name], MD  insulin degludec (TRESIBA FLEXTOUCH) 200 UNIT/ML FlexTouch Pen Inject 90 Units into the skin at bedtime. As needed 08/03/20   [name], MD  insulin regular (NOVOLIN R) 100 units/mL injection Inject 0.04 mLs (4 Units total) into the skin 3 (three) times daily before meals. 08/03/20    [name], MD  l-methylfolate-B6-B12 [facility]) 3-35-2 MG TABS tablet Take 1 tablet by mouth daily. 07/07/20   Swayze, Ava, DO  levothyroxine (SYNTHROID) 50 MCG tablet Take 1 tablet (50 mcg total) by mouth daily before breakfast. Per endo [name] 12/07/19   [name], MD  LUMIGAN 0.01 % SOLN Place 1 drop into the right eye at bedtime. 06/01/20   [name], MD  metoprolol tartrate (LOPRESSOR) 25 MG tablet TAKE ONE-HALF (1/2) TABLET (12.5 MG) AT BEDTIME WITH MORNING DOSE IF NEEDED Patient taking differently: Take 12.5 mg by mouth See admin instructions. TAKE ONE-HALF (1/2) TABLET (12.5 MG) AT BEDTIME WITH MORNING DOSE IF NEEDED 04/13/20   [name], MD  nitroGLYCERIN (NITROSTAT) 0.4 MG SL tablet DISSOLVE 1 TABLET UNDER THE TONGUE EVERY 5 MINUTES AS NEEDED FOR CHEST PAIN Patient taking differently: Place 0.4 mg under the tongue every 5 (five) minutes as needed for chest pain. 07/28/16   [name], MD  Omega-3 Fatty Acids (FISH OIL) 1000 MG CAPS Take 1 capsule (1,000 mg total) by mouth in the morning and at bedtime. 06/01/20   [name], MD  ondansetron (ZOFRAN) 4 MG tablet Take 1 tablet (4 mg total) by mouth every 8 (eight) hours as needed for nausea or vomiting. 09/28/20   [name], MD  oxyCODONE (OXY IR/ROXICODONE) 5 MG immediate release tablet Take 1 tablet (5 mg total) by mouth 2 (two) times daily as needed. rls 07/06/20   Swayze, Ava, DO  pramipexole (MIRAPEX) 1 MG tablet Take 1 tablet (1 mg total) by mouth at bedtime. 04/22/20   [name], MD  RABEprazole (ACIPHEX) 20 MG tablet TAKE 1 TABLET DAILY 08/03/20   [name], MD  rosuvastatin (CRESTOR) 40 MG tablet Take 1 tablet (40 mg total) by mouth daily. 04/22/20   [name], MD  Semaglutide, 1 MG/DOSE, (OZEMPIC, 1 MG/DOSE,) 2 MG/1.5ML SOPN 1 mg weekly 06/08/20   [provider]  sulfamethoxazole-trimethoprim (BACTRIM DS) 800-160 MG tablet Take 1  tablet by mouth 2 (two)  times daily. 10/26/20   [name], MD  traZODone (DESYREL) 50 MG tablet Take 50 mg by mouth at bedtime as needed for sleep.    [provider]  valsartan (DIOVAN) 40 MG tablet Take 1 tablet (40 mg total) by mouth daily. 08/03/20   [name], MD  vitamin C (ASCORBIC ACID) 500 MG tablet Take 1,000 mg by mouth daily.     [provider]    Physical Exam: Vitals:   11/02/20 1830 11/02/20 1845 11/02/20 1915 11/02/20 1930  BP:    134/66  Pulse: 76 72 68 67  Resp: (!) 21 16 17 16   Temp:      TempSrc:      SpO2: 94% 96% 94% 94%  Weight:      Height:        Physical Exam Constitutional:      General: He is not in acute distress. HENT:     Head: Normocephalic and atraumatic.     Mouth/Throat:     Comments: Very dry mucous membranes Eyes:     Extraocular Movements: Extraocular movements intact.  Cardiovascular:     Rate and Rhythm: Normal rate and regular rhythm.     Pulses: Normal pulses.  Pulmonary:     Effort: Pulmonary effort is normal. No respiratory distress.     Breath sounds: Normal breath sounds. No wheezing or rales.  Abdominal:     General: Bowel sounds are normal.     Palpations: Abdomen is soft.     Tenderness: There is abdominal tenderness.     Comments: Suprapubic tenderness  Musculoskeletal:        General: No swelling or tenderness.     Cervical back: Normal range of motion and neck supple.  Skin:    General: Skin is warm and dry.  Neurological:     Mental Status: He is alert and oriented to person, place, and time.     Comments: Examination limited as patient is currently very restless Moving all extremities spontaneously, no focal motor weakness.     Labs on Admission: I have personally reviewed following labs and imaging studies  CBC: Recent Labs  Lab 11/02/20 1200 11/02/20 2005  WBC 9.5  --   HGB 11.9* 12.6*  HCT 36.9* 37.0*  MCV 93.4  --   PLT 173  --    Basic Metabolic Panel: Recent Labs  Lab 11/02/20 1200  11/02/20 2005  NA 134* 139  K 4.9 4.4  CL 105  --   CO2 22  --   GLUCOSE 194*  --   BUN 29*  --   CREATININE 1.78*  --   CALCIUM 8.1*  --    GFR: Estimated Creatinine Clearance: 30.9 mL/min (A) (by C-G formula based on SCr of 1.78 mg/dL (H)). Liver Function Tests: Recent Labs  Lab 11/02/20 1200  AST 24  ALT 16  ALKPHOS 39  BILITOT 0.2*  PROT 6.4*  ALBUMIN 3.1*   No results for input(s): LIPASE, AMYLASE in the last 168 hours. No results for input(s): AMMONIA in the last 168 hours. Coagulation Profile: Recent Labs  Lab 11/02/20 1233  INR 1.1   Cardiac Enzymes: No results for input(s): CKTOTAL, CKMB, CKMBINDEX, TROPONINI in the last 168 hours. BNP (last 3 results) No results for input(s): PROBNP in the last 8760 hours. HbA1C: No results for input(s): HGBA1C in the last 72 hours. CBG: Recent Labs  Lab 11/02/20 1226 11/02/20 2014  GLUCAP 163* 61*  Lipid Profile: No results for input(s): CHOL, HDL, LDLCALC, TRIG, CHOLHDL, LDLDIRECT in the last 72 hours. Thyroid Function Tests: No results for input(s): TSH, T4TOTAL, FREET4, T3FREE, THYROIDAB in the last 72 hours. Anemia Panel: No results for input(s): VITAMINB12, FOLATE, FERRITIN, TIBC, IRON, RETICCTPCT in the last 72 hours. Urine analysis:    Component Value Date/Time   COLORURINE STRAW (A) 11/02/2020 1344   APPEARANCEUR CLEAR 11/02/2020 1344   LABSPEC 1.005 11/02/2020 1344   PHURINE 5.0 11/02/2020 1344   GLUCOSEU NEGATIVE 11/02/2020 1344   HGBUR NEGATIVE 11/02/2020 1344   BILIRUBINUR NEGATIVE 11/02/2020 1344   BILIRUBINUR negative 10/18/2020 0902   KETONESUR NEGATIVE 11/02/2020 1344   PROTEINUR NEGATIVE 11/02/2020 1344   UROBILINOGEN 0.2 10/18/2020 0902   UROBILINOGEN 0.2 03/18/2015 1102   NITRITE NEGATIVE 11/02/2020 1344   LEUKOCYTESUR NEGATIVE 11/02/2020 1344    Radiological Exams on Admission: CT Head Wo Contrast  Result Date: 11/02/2020 CLINICAL DATA:  87 year old male with history of minor  head trauma. Slurred speech. EXAM: CT HEAD WITHOUT CONTRAST TECHNIQUE: Contiguous axial images were obtained from the base of the skull through the vertex without intravenous contrast. COMPARISON:  Head CT 07/01/2020. FINDINGS: Brain: Mild cerebral atrophy. Patchy and confluent areas of decreased attenuation are noted throughout the deep and periventricular white matter of the cerebral hemispheres bilaterally, compatible with chronic microvascular ischemic disease. Large old lacunar infarct in the left corona radiata and lentiform nucleus. No evidence of acute infarction, hemorrhage, hydrocephalus, extra-axial collection or mass lesion/mass effect. Vascular: No hyperdense vessel. Atherosclerotic calcifications in the vasculature at the skull base. Skull: Normal. Negative for fracture or focal lesion. Sinuses/Orbits: No acute finding. Other: None. IMPRESSION: 1. No acute intracranial abnormalities. 2. Mild cerebral atrophy with chronic microvascular ischemic changes and old left-sided lacunar infarct, similar to the prior study, as above. Electronically Signed   By: Vinnie Langton M.D.   On: 11/02/2020 13:19   MR BRAIN WO CONTRAST  Result Date: 11/02/2020 CLINICAL DATA:  Weakness and diplopia. EXAM: MRI HEAD WITHOUT CONTRAST TECHNIQUE: Multiplanar, multiecho pulse sequences of the brain and surrounding structures were obtained without intravenous contrast. COMPARISON:  Head CT 11/02/2020 and MRI 10/26/2018 FINDINGS: The patient was unable to tolerate the complete examination. Axial and coronal diffusion, axial FLAIR, axial T2, and sagittal T1 sequences were obtained and are moderately motion degraded. Brain: There is no evidence of an acute infarct, intracranial hemorrhage, mass, midline shift, or extra-axial fluid collection within limitations of motion artifact. A chronic infarct is again noted in the left basal ganglia and deep white matter with associated ex vacuo dilatation of the left lateral ventricle.  There is mild cerebral atrophy. Vascular: Major intracranial vascular flow voids are preserved. Skull and upper cervical spine: No gross marrow lesion. Sinuses/Orbits: Bilateral cataract extraction. Paranasal sinuses and mastoid air cells are clear. Other: None. IMPRESSION: 1. Motion degraded, incomplete examination. 2. No acute infarct. 3. Chronic left basal ganglia infarct. Electronically Signed   By: Logan Bores M.D.   On: 11/02/2020 18:54   DG Chest Port 1 View  Result Date: 11/02/2020 CLINICAL DATA:  Loss of consciousness. EXAM: PORTABLE CHEST 1 VIEW COMPARISON:  07/02/2020 FINDINGS: 1231 hours. The lungs are clear without focal pneumonia, edema, pneumothorax or pleural effusion. Interstitial markings are diffusely coarsened with chronic features. Cardiopericardial silhouette is at upper limits of normal for size. Bones are diffusely demineralized. Telemetry leads overlie the chest. IMPRESSION: No active disease. Electronically Signed   By: Misty Stanley M.D.   On: 11/02/2020 12:55  Assessment/Plan Principal Problem:   Acute encephalopathy Active Problems:   CKD stage 3 secondary to diabetes (HCC)   GERD (gastroesophageal reflux disease)   MDD (major depressive disorder), recurrent episode, moderate (HCC)   HTN (hypertension)   Acute toxic metabolic encephalopathy Diplopia, dysarthria Reportedly his mental status was normal in the ED initially but became confused and restless after receiving Ativan and gabapentin.  Additionally, he was found to be hypoglycemic at that time with CBG 61 and was given juice.  CBG now improved to 93.  VBG with pH 7.40, PCO2 33.  Head CT negative for acute intracranial abnormality.  Brain MRI (motion degraded exam) showing no acute infarct.  UA without signs of infection.  Blood ethanol level undetectable.  At present, able to answer orientation questions appropriately but otherwise confused and very restless.  Sitting up at the edge of the bed, trying to get  up, and at risk for falling.  Appears dehydrated with very dry mucous membranes, give IV fluid hydration.  May require soft restraints to ensure his safety if patient continues to be restless/trying to get out of the bed and at risk of falling.  I have requested an EKG to be done stat to assess QT interval before Haldol can be given for sedation.  I have also requested nursing staff to do a bladder scan stat to assess for acute urinary retention as patient has not been able to urinate since he has been in the ED and has suprapubic tenderness on exam.  Addendum: Bladder scan showing 806 cc, Foley placed for acute urinary retention.  ?Syncope: Daughter reported that patient has had multiple episodes of falling asleep while sitting down for the past few days.  ?Syncopal events.  PE less likely given no hypoxia or tachycardia. -Cardiac monitoring.  Stat EKG.  Order echocardiogram, carotid Dopplers, and EEG.  Check orthostatics.  CKD stage III: Creatinine 1.7, previously ranging between 1.3-1.6. -Continue to monitor renal function  Insulin-dependent type 2 diabetes -Check A1c.  Hold insulin given hypoglycemic episode.  Regular diet at this time.  Frequent CBG checks.  Hypertension: Stable.  Currently normotensive. -Hold antihypertensives at this time, orthostatics pending.  History of CVA Depression GERD BPH  Hyperlipidemia Hypothyroidism: Check TSH level. RLS -Pharmacy med rec pending  DVT prophylaxis: Subcutaneous heparin Code Status: Full code.  Discussed with the patient and his daughter at bedside. Family Communication: Daughter updated. Disposition Plan: Status is: Inpatient  Remains inpatient appropriate because:Altered mental status, IV treatments appropriate due to intensity of illness or inability to take PO and Inpatient level of care appropriate due to severity of illness   Dispo: The patient is from: Home              Anticipated d/c is to: SNF              Anticipated d/c  date is: 3 days              Patient currently is not medically stable to d/c.   Difficult to place patient No  Level of care: Level of care: Telemetry Cardiac   The medical decision making on this patient was of high complexity and the patient is at high risk for clinical deterioration, therefore this is a level 3 visit.  Shela Leff MD [facility]  If 7PM-7AM, please contact night-coverage [email or web address]  11/02/2020, 9:07 PM

## 2020-11-02 NOTE — ED Notes (Signed)
Bladder scanned pt 806 mL

## 2020-11-02 NOTE — ED Notes (Signed)
EKG was not done earlier due to pt thrashing around in bed. MD aware.

## 2020-11-02 NOTE — ED Notes (Signed)
Pt is sleeping and very restless with his body. Daughter is at bedside. Tried to get VS, pt could not stay still even with me holding his arm down. Told the daughter we will try again in a little bit.

## 2020-11-02 NOTE — ED Triage Notes (Signed)
Patient BIB [facility] from home. Patient has been treated for UTI x1 week. Feels like he was improving until yesterday. Patient states he has some periods of double vision that resolved. Daughter notes some slurred speech that began this morning. LSW 0830 yesterday. Reports syncopal episode this morning in which he fell off the toilet this morning hitting his head. Patient is on blood thinners. Hypotensive in triage.

## 2020-11-02 NOTE — ED Notes (Signed)
RN has stuck pt 3 times. 2 of those times were a success and pt pulled the IVs out. The IVs lasted about an hour. I just attempted a third time and was unsuccessful. RN will put in for IV consult.

## 2020-11-02 NOTE — ED Provider Notes (Signed)
[facility] EMERGENCY DEPARTMENT Provider Note   CSN: [id number] Arrival date & time: 11/02/20  1149     History Chief Complaint  Patient presents with  . Loss of Consciousness  . 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  HPI    87 year old male with history of CKD, CVA with right-sided deficit, TIA, hyperlipidemia comes in a chief complaint of loss of consciousness.  Patient reports that he never fainted.  He states that he has been feeling sleepy the last couple of days.  Today he was on the commode and fell down.  Patient has also noted some double vision.  Double vision is binocular, worse when he is lifting and peripheral fields.  Review of system is positive for headaches.  Patient is having generalized weakness, no focal weakness.  He denies any numbness, tingling, spinning sensation.  Patient has been taking his medications as prescribed.  Patient is on antibiotics for UTI, denies any fevers, chills, COVID-19 exposures.  Past Medical History:  Diagnosis Date  . Anemia   . Basal cell carcinoma 2018   R temple   . Basal cell carcinoma of nose 2002   bridge of nose  . BPH with obstruction/lower urinary tract symptoms    failed flomax, uroxatral, myrbetriq - sees urology [name]) pending videourodynamics  . CKD (chronic kidney disease) stage 3, GFR 30-59 ml/min (HCC) 03/11/2012   Renal US 02/2014 - multiple kidney cysts   . CVA (cerebral infarction) 03/07/12   slurred speech; right hemplegia, left handed - completed PT 07/2014 (17 sessions)  . Depression    mild, on cymbalta per prior PCP  . Erectile dysfunction    per urology (prostaglandin injection)  . GERD (gastroesophageal reflux disease)    with sliding HH  . H/O hiatal hernia   . History of kidney problems 12/2010   lacerated left kidney after fall  . HLD (hyperlipidemia)   . Hx of basal cell carcinoma 07/10/2017   R. lat. forehead near hair line  . Kidney cysts 02/2014    bilateral (renal US)  . Migraines 03/11/12   now rare  . OSA on CPAP    sleep study 01/2014 - severe, CPAP at 10, previously on nuvigil (Dr. [name])  . Palpitations 01/2012   holter WNL, rare PAC/PVCs  . Presbycusis of both ears 2016   rec amplification [facility])  . Restless leg syndrome    well controlled on mirapex  . Rosacea    [place]  . Stroke ([place])   . TIA (transient ischemic attack)   . Type 2 diabetes, controlled, with neuropathy [facility]) 2011   [name] in [facility]    Patient Active Problem List   Diagnosis Date Noted  . Dysuria 10/18/2020  . Nausea 10/18/2020  . Fever 07/02/2020  . Healthcare maintenance 06/01/2020  . Involuntary movements 04/25/2020  . Post-nasal drainage 07/16/2019  . Recurrent productive cough 05/31/2019  . Hx of basal cell carcinoma 07/10/2017  . Rosacea   . Gait disturbance, post-stroke 10/05/2015  . Presbycusis of both ears   . Advanced care planning/counseling discussion 06/23/2015  . Impaired mobility 04/23/2015  . Hearing loss of right ear due to cerumen impaction 03/23/2015  . HTN (hypertension) 03/18/2015  . Diastolic dysfunction 88/41/6606  . Spastic hemiparesis affecting dominant side ([place]) 07/07/2014  . Shoulder joint pain 03/16/2014  . Chest pressure 02/03/2014  . Medicare annual wellness visit, subsequent 11/06/2013  . Erectile dysfunction due to arterial insufficiency  02/05/2013  . MDD (major depressive disorder), recurrent episode, moderate ([place])   . Palpitations 06/26/2012  . Hyperlipidemia associated with type 2 diabetes mellitus ([place])   . Restless leg syndrome   . GERD (gastroesophageal reflux disease)   . BPH with obstruction/lower urinary tract symptoms   . History of CVA (cerebrovascular accident) 03/11/2012  . Hemiparesis affecting right side as late effect of stroke ([place]) 03/11/2012  . Type 2 diabetes mellitus, uncontrolled, with neuropathy ([place]) 03/11/2012  . OSA on CPAP 03/11/2012  . CKD stage 3 secondary to diabetes  ([place]) 03/11/2012  . Migraines 03/11/2012  . Thrombocytopenia ([place]) 2011    Past Surgical History:  Procedure Laterality Date  . ABI  2007   WNL  . CARDIAC CATHETERIZATION  2004   normal; [place]  . CARDIOVASCULAR STRESS TEST  02/2014   WNL, low risk scan, EF 68%  . CAROTID ENDARTERECTOMY    . CATARACT EXTRACTION W/ INTRAOCULAR LENS IMPLANT  11//2012 - 08/2011  . COLONOSCOPY  03/2011   poor prep, unable to biopsy polyp in tic fold, rpt 3 mo [name])  . COLONOSCOPY  06/2011   mult polyps adenomatous, severe diverticulosis, rpt 3 yrs [name])  . CYSTOSCOPY WITH URETHRAL DILATATION N/A 07/24/2013   Procedure: CYSTOSCOPY WITH URETHRAL DILATATION;  Surgeon: [name], MD;  Location: [facility] ORS;  Service: Urology;  Laterality: N/A;  . ESOPHAGOGASTRODUODENOSCOPY  2006   duodenitis, medual HH, Grade 2 reflux, mild gastritis  . ESOPHAGOGASTRODUODENOSCOPY  07/2011   mild chronic gastritis  . [place]  . [place]; 2002   left, then repaired  . MRI brain  04/2010   WNL  . PROSTATE SURGERY  02/2009   PVP (laser)  . SKIN CANCER EXCISION  2002   bridge of nose, [facility]  . TONSILLECTOMY AND ADENOIDECTOMY  ~ 1945  . TRANSURETHRAL INCISION OF PROSTATE N/A 07/24/2013   Procedure: TRANSURETHRAL INCISION OF THE PROSTATE (TUIP);  Surgeon: [name], MD;  Location: [facility] ORS;  Service: Urology;  Laterality: N/A;  . URETHRAL DILATION  2014   t[place]       Family History  Problem Relation Age of Onset  . Stroke Father   . Lung cancer Father 31        smoker  . Stroke Brother   . Diabetes Brother 36  . Lung cancer Brother   . Alcoholism Sister   . Kidney disease Sister   . Coronary artery disease Neg Hx     Social History   Tobacco Use  . Smoking status: Never Smoker  . Smokeless tobacco: Never Used  . Tobacco comment: "used to smoke pipe when I was younger"  Vaping Use  . Vaping Use: Never used  Substance Use Topics  . Alcohol use:  Yes    Alcohol/week: 4.0 standard drinks    Types: 2 Glasses of wine, 2 Shots of liquor per week    Comment: occasional  . Drug use: No    Home Medications Prior to Admission medications   Medication Sig Start Date End Date Taking? Authorizing Provider  Alpha-Lipoic Acid 600 MG CAPS Take 1 capsule (600 mg total) by mouth daily. 12/07/19   [name], MD  atorvastatin (LIPITOR) 40 MG tablet Take 40 mg by mouth daily.    [provider]  BD PEN NEEDLE NANO 2ND GEN 32G X 4 MM MISC 1 each by Other route daily. 10/25/20   [name], MD  celecoxib (CELEBREX) 200  MG capsule Take 1 capsule (200 mg total) by mouth daily. 06/16/20   Copland, Frederico Hamman, MD  cephALEXin (KEFLEX) 500 MG capsule Take 1 capsule (500 mg total) by mouth 2 (two) times daily. 10/18/20   [name], MD  clopidogrel (PLAVIX) 75 MG tablet Take 1 tablet (75 mg total) by mouth daily. 01/13/20   [name], MD  Coenzyme Q10 (COQ10) 150 MG CAPS Take 1 capsule by mouth 2 (two) times daily.    [provider]  diclofenac Sodium (VOLTAREN) 1 % GEL Apply 2 g topically 3 (three) times daily as needed. Patient taking differently: Apply 2 g topically 3 (three) times daily as needed (pain). 06/04/20   [name], MD  DULoxetine (CYMBALTA) 60 MG capsule TAKE 1 CAPSULE DAILY 08/03/20   [name], MD  famotidine (PEPCID) 20 MG tablet Take 1 tablet (20 mg total) by mouth at bedtime. 07/16/19   [name], MD  gabapentin (NEURONTIN) 300 MG capsule Take 2 capsules (600 mg total) by mouth 2 (two) times daily. 06/01/20   [name], MD  insulin degludec (TRESIBA FLEXTOUCH) 200 UNIT/ML FlexTouch Pen Inject 90 Units into the skin at bedtime. As needed 08/03/20   [name], MD  insulin regular (NOVOLIN R) 100 units/mL injection Inject 0.04 mLs (4 Units total) into the skin 3 (three) times daily before meals. 08/03/20   [name], MD  l-methylfolate-B6-B12 [facility]) 3-35-2 MG  TABS tablet Take 1 tablet by mouth daily. 07/07/20   Swayze, Ava, DO  levothyroxine (SYNTHROID) 50 MCG tablet Take 1 tablet (50 mcg total) by mouth daily before breakfast. Per endo [name] 12/07/19   [name], MD  LUMIGAN 0.01 % SOLN Place 1 drop into the right eye at bedtime. 06/01/20   [name], MD  metoprolol tartrate (LOPRESSOR) 25 MG tablet TAKE ONE-HALF (1/2) TABLET (12.5 MG) AT BEDTIME WITH MORNING DOSE IF NEEDED Patient taking differently: Take 12.5 mg by mouth See admin instructions. TAKE ONE-HALF (1/2) TABLET (12.5 MG) AT BEDTIME WITH MORNING DOSE IF NEEDED 04/13/20   [name], MD  nitroGLYCERIN (NITROSTAT) 0.4 MG SL tablet DISSOLVE 1 TABLET UNDER THE TONGUE EVERY 5 MINUTES AS NEEDED FOR CHEST PAIN Patient taking differently: Place 0.4 mg under the tongue every 5 (five) minutes as needed for chest pain. 07/28/16   [name], MD  Omega-3 Fatty Acids (FISH OIL) 1000 MG CAPS Take 1 capsule (1,000 mg total) by mouth in the morning and at bedtime. 06/01/20   [name], MD  ondansetron (ZOFRAN) 4 MG tablet Take 1 tablet (4 mg total) by mouth every 8 (eight) hours as needed for nausea or vomiting. 09/28/20   [name], MD  oxyCODONE (OXY IR/ROXICODONE) 5 MG immediate release tablet Take 1 tablet (5 mg total) by mouth 2 (two) times daily as needed. rls 07/06/20   Swayze, Ava, DO  pramipexole (MIRAPEX) 1 MG tablet Take 1 tablet (1 mg total) by mouth at bedtime. 04/22/20   [name], MD  RABEprazole (ACIPHEX) 20 MG tablet TAKE 1 TABLET DAILY 08/03/20   [name], MD  rosuvastatin (CRESTOR) 40 MG tablet Take 1 tablet (40 mg total) by mouth daily. 04/22/20   [name], MD  Semaglutide, 1 MG/DOSE, (OZEMPIC, 1 MG/DOSE,) 2 MG/1.5ML SOPN 1 mg weekly 06/08/20   [provider]  sulfamethoxazole-trimethoprim (BACTRIM DS) 800-160 MG tablet Take 1 tablet by mouth 2 (two) times daily. 10/26/20   [name], MD  traZODone (DESYREL) 50  MG tablet Take 50 mg by mouth  at bedtime as needed for sleep.    [provider]  valsartan (DIOVAN) 40 MG tablet Take 1 tablet (40 mg total) by mouth daily. 08/03/20   [name], MD  vitamin C (ASCORBIC ACID) 500 MG tablet Take 1,000 mg by mouth daily.     [provider]    Allergies    Crestor [rosuvastatin], Dulaglutide, Empagliflozin, Gabapentin, Lisinopril, Nexium [esomeprazole], Niacin and related, and Pregabalin  Review of Systems   Review of Systems  Constitutional: Positive for activity change and fatigue.  Eyes: Positive for visual disturbance.  Respiratory: Negative for shortness of breath.   Cardiovascular: Negative for chest pain.  Neurological: Positive for headaches.  Hematological: Does not bruise/bleed easily.  All other systems reviewed and are negative.   Physical Exam Updated Vital Signs BP (!) 110/93   Pulse 72   Temp 97.6 F (36.4 C) (Oral)   Resp 19   Ht 5\' 8"  (1.727 m)   Wt 83.9 kg   SpO2 96%   BMI 28.13 kg/m   Physical Exam Vitals and nursing note reviewed.  Constitutional:      Appearance: He is well-developed.  HENT:     Head: Atraumatic.  Eyes:     Extraocular Movements: Extraocular movements intact.     Pupils: Pupils are equal, round, and reactive to light.     Comments: Visual fields are intact, but patient has binocular diplopia that is worse in the periphery  Cardiovascular:     Rate and Rhythm: Normal rate.  Pulmonary:     Effort: Pulmonary effort is normal.  Musculoskeletal:     Cervical back: Neck supple.  Skin:    General: Skin is warm.  Neurological:     General: No focal deficit present.     Mental Status: He is alert and oriented to person, place, and time.     Cranial Nerves: No cranial nerve deficit.     Sensory: No sensory deficit.     Motor: No weakness.     Coordination: Coordination normal.     ED Results / Procedures / Treatments   Labs (all labs ordered are listed, but only  abnormal results are displayed) Labs Reviewed  COMPREHENSIVE METABOLIC PANEL - Abnormal; Notable for the following components:      Result Value   Sodium 134 (*)    Glucose, Bld 194 (*)    BUN 29 (*)    Creatinine, Ser 1.78 (*)    Calcium 8.1 (*)    Total Protein 6.4 (*)    Albumin 3.1 (*)    Total Bilirubin 0.2 (*)    GFR, Estimated 36 (*)    All other components within normal limits  CBC - Abnormal; Notable for the following components:   RBC 3.95 (*)    Hemoglobin 11.9 (*)    HCT 36.9 (*)    All other components within normal limits  URINALYSIS, ROUTINE W REFLEX MICROSCOPIC - Abnormal; Notable for the following components:   Color, Urine STRAW (*)    All other components within normal limits  CBG MONITORING, ED - Abnormal; Notable for the following components:   Glucose-Capillary 163 (*)    All other components within normal limits  ETHANOL  LACTIC ACID, PLASMA  PROTIME-INR  SAMPLE TO BLOOD BANK    EKG None  Radiology CT Head Wo Contrast  Result Date: 11/02/2020 CLINICAL DATA:  87 year old male with history of minor head trauma. Slurred speech. EXAM: CT HEAD WITHOUT CONTRAST TECHNIQUE: Contiguous axial images were  obtained from the base of the skull through the vertex without intravenous contrast. COMPARISON:  Head CT 07/01/2020. FINDINGS: Brain: Mild cerebral atrophy. Patchy and confluent areas of decreased attenuation are noted throughout the deep and periventricular white matter of the cerebral hemispheres bilaterally, compatible with chronic microvascular ischemic disease. Large old lacunar infarct in the left corona radiata and lentiform nucleus. No evidence of acute infarction, hemorrhage, hydrocephalus, extra-axial collection or mass lesion/mass effect. Vascular: No hyperdense vessel. Atherosclerotic calcifications in the vasculature at the skull base. Skull: Normal. Negative for fracture or focal lesion. Sinuses/Orbits: No acute finding. Other: None. IMPRESSION: 1. No  acute intracranial abnormalities. 2. Mild cerebral atrophy with chronic microvascular ischemic changes and old left-sided lacunar infarct, similar to the prior study, as above. Electronically Signed   By: Vinnie Langton M.D.   On: 11/02/2020 13:19   DG Chest Port 1 View  Result Date: 11/02/2020 CLINICAL DATA:  Loss of consciousness. EXAM: PORTABLE CHEST 1 VIEW COMPARISON:  07/02/2020 FINDINGS: 1231 hours. The lungs are clear without focal pneumonia, edema, pneumothorax or pleural effusion. Interstitial markings are diffusely coarsened with chronic features. Cardiopericardial silhouette is at upper limits of normal for size. Bones are diffusely demineralized. Telemetry leads overlie the chest. IMPRESSION: No active disease. Electronically Signed   By: Misty Stanley M.D.   On: 11/02/2020 12:55    Procedures Procedures   Medications Ordered in ED Medications  gabapentin (NEURONTIN) capsule 600 mg (600 mg Oral Given 11/02/20 1545)  LORazepam (ATIVAN) injection 1 mg (1 mg Intravenous Given 11/02/20 1548)    ED Course  I have reviewed the triage vital signs and the nursing notes.  Pertinent labs & imaging results that were available during my care of the patient were reviewed by me and considered in my medical decision making (see chart for details).    MDM Rules/Calculators/A&P                          87 year old male comes in with chief complaint of diplopia, weakness.  He denies syncope, reports that for the last 2 days he has been feeling very sleepy and the fall today from commode was because he " dozed off."  Patient is on medications for UTI.  He is currently oriented x4.  No signs of infection driving this process.  Differential diagnosis includes stroke given the diplopia.  Doubt focal ocular issue given that the process is binocular.  Appropriate labs, CT head ordered.  Results are reassuring.  MRI of the brain ordered.  If the MRI is negative the patient can be discharged with  outpatient ophthalmology follow-up.  Patient and the daughter have been made aware of this plan and they are comfortable with it.  Final Clinical Impression(s) / ED Diagnoses Final diagnoses:  Fall  Diplopia    Rx / DC Orders ED Discharge Orders    None       Varney Biles, MD 11/02/20 [phone]

## 2020-11-02 NOTE — ED Notes (Signed)
RN spoke with MD Marlowe Sax and stated she can not put in an order for CPAP until the COVID test has resulted.

## 2020-11-02 NOTE — ED Provider Notes (Addendum)
Assumed care from Dr. Kathrynn Humble at 5 PM.  Patient is an elderly male presenting today with multiple days of generalized fatigue, minimal confusion and fell asleep today while he was on the toilet but unclear if he potentially had syncope.  Upon arrival here Dr. Kathrynn Humble reports that patient mental status was normal and he was able to answer questions.  He did complain of diplopia that was worse yesterday but by the end of the day was more present in the periphery.  Patient's MRI today showed significant artifact and he did receive gabapentin and Ativan before going over.  Now patient is confused, restless which may be a result of the medications but also still concern for underlying issue.  May be related to medication as he is on his last day of an antibiotic.  Low suspicion for Covid at this time but order in process.  Patient does have some mild AKI today.  Will check vbg which is normal.  CBG with sugar of 61 and pt was able to drink some juice.   Blanchie Dessert, MD 11/02/20 Doran Heater    Blanchie Dessert, MD 11/02/20 2027

## 2020-11-02 NOTE — Progress Notes (Signed)
Orthopedic Tech Progress Note Patient Details:  LINDEN TAGLIAFERRO, MD Jan 29, 1931 [id number] Level 2 trauma this morning Patient ID: [name], MD, male   DOB: 13-Jun-1931, 87 y.o.   MRN: [id number]   Janit Pagan 11/02/2020, 1:43 PM

## 2020-11-02 NOTE — ED Notes (Signed)
RN applied 4 point soft restraints

## 2020-11-02 NOTE — ED Notes (Signed)
MD aware of patients status change.

## 2020-11-03 ENCOUNTER — Inpatient Hospital Stay (HOSPITAL_COMMUNITY): Payer: Medicare Other

## 2020-11-03 ENCOUNTER — Encounter (HOSPITAL_COMMUNITY): Payer: Self-pay | Admitting: Internal Medicine

## 2020-11-03 DIAGNOSIS — R55 Syncope and collapse: Secondary | ICD-10-CM | POA: Diagnosis not present

## 2020-11-03 DIAGNOSIS — G9340 Encephalopathy, unspecified: Secondary | ICD-10-CM

## 2020-11-03 LAB — BASIC METABOLIC PANEL
Anion gap: 9 (ref 5–15)
BUN: 25 mg/dL — ABNORMAL HIGH (ref 8–23)
CO2: 21 mmol/L — ABNORMAL LOW (ref 22–32)
Calcium: 8.6 mg/dL — ABNORMAL LOW (ref 8.9–10.3)
Chloride: 109 mmol/L (ref 98–111)
Creatinine, Ser: 1.74 mg/dL — ABNORMAL HIGH (ref 0.61–1.24)
GFR, Estimated: 37 mL/min — ABNORMAL LOW (ref >60)
Glucose, Bld: 64 mg/dL — ABNORMAL LOW (ref 70–99)
Potassium: 4.3 mmol/L (ref 3.5–5.1)
Sodium: 139 mmol/L (ref 135–145)

## 2020-11-03 LAB — GLUCOSE, CAPILLARY
Glucose-Capillary: 101 mg/dL — ABNORMAL HIGH (ref 70–99)
Glucose-Capillary: 57 mg/dL — ABNORMAL LOW (ref 70–99)
Glucose-Capillary: 119 mg/dL — ABNORMAL HIGH (ref 70–99)
Glucose-Capillary: 125 mg/dL — ABNORMAL HIGH (ref 70–99)
Glucose-Capillary: 189 mg/dL — ABNORMAL HIGH (ref 70–99)
Glucose-Capillary: 174 mg/dL — ABNORMAL HIGH (ref 70–99)

## 2020-11-03 LAB — CBG MONITORING, ED: Glucose-Capillary: 62 mg/dL — ABNORMAL LOW (ref 70–99)

## 2020-11-03 LAB — TSH: TSH: 2.763 u[IU]/mL (ref 0.350–4.500)

## 2020-11-03 MED ORDER — INSULIN ASPART 100 UNIT/ML ~~LOC~~ SOLN
0.0000 [IU] | Freq: Three times a day (TID) | SUBCUTANEOUS | Status: DC
  Administered 2020-11-03: 2 [IU] via SUBCUTANEOUS
  Administered 2020-11-03 – 2020-11-04 (×2): 1 [IU] via SUBCUTANEOUS
  Administered 2020-11-04: 3 [IU] via SUBCUTANEOUS
  Administered 2020-11-05: 1 [IU] via SUBCUTANEOUS
  Administered 2020-11-05: 2 [IU] via SUBCUTANEOUS

## 2020-11-03 MED ORDER — GABAPENTIN 100 MG PO CAPS
100.0000 mg | ORAL_CAPSULE | Freq: Two times a day (BID) | ORAL | Status: DC
  Administered 2020-11-03 – 2020-11-05 (×5): 100 mg via ORAL
  Filled 2020-11-03 (×5): qty 1

## 2020-11-03 MED ORDER — PERFLUTREN LIPID MICROSPHERE
1.0000 mL | INTRAVENOUS | Status: AC | PRN
  Administered 2020-11-03: 2 mL via INTRAVENOUS
  Filled 2020-11-03: qty 10

## 2020-11-03 MED ORDER — ASCORBIC ACID 500 MG PO TABS
1000.0000 mg | ORAL_TABLET | Freq: Every day | ORAL | Status: DC
  Administered 2020-11-04 – 2020-11-05 (×2): 1000 mg via ORAL
  Filled 2020-11-03 (×3): qty 2

## 2020-11-03 MED ORDER — MELATONIN 5 MG PO TABS
5.0000 mg | ORAL_TABLET | Freq: Every evening | ORAL | Status: DC | PRN
  Administered 2020-11-03 – 2020-11-04 (×2): 5 mg via ORAL
  Filled 2020-11-03 (×2): qty 1

## 2020-11-03 MED ORDER — CLOPIDOGREL BISULFATE 75 MG PO TABS
75.0000 mg | ORAL_TABLET | Freq: Every day | ORAL | Status: DC
  Administered 2020-11-03 – 2020-11-05 (×3): 75 mg via ORAL
  Filled 2020-11-03 (×3): qty 1

## 2020-11-03 MED ORDER — PRAMIPEXOLE DIHYDROCHLORIDE 1 MG PO TABS
1.0000 mg | ORAL_TABLET | Freq: Every day | ORAL | Status: DC
Start: 2020-11-03 — End: 2020-11-05
  Administered 2020-11-03 – 2020-11-04 (×2): 1 mg via ORAL
  Filled 2020-11-03 (×2): qty 1

## 2020-11-03 MED ORDER — LATANOPROST 0.005 % OP SOLN
1.0000 [drp] | Freq: Every day | OPHTHALMIC | Status: DC
  Administered 2020-11-03 – 2020-11-04 (×2): 1 [drp] via OPHTHALMIC
  Filled 2020-11-03: qty 2.5

## 2020-11-03 MED ORDER — SENNOSIDES-DOCUSATE SODIUM 8.6-50 MG PO TABS
1.0000 | ORAL_TABLET | Freq: Two times a day (BID) | ORAL | Status: DC
  Administered 2020-11-03 – 2020-11-05 (×5): 1 via ORAL
  Filled 2020-11-03 (×5): qty 1

## 2020-11-03 MED ORDER — ROSUVASTATIN CALCIUM 20 MG PO TABS
40.0000 mg | ORAL_TABLET | Freq: Every day | ORAL | Status: DC
  Administered 2020-11-03 – 2020-11-05 (×3): 40 mg via ORAL
  Filled 2020-11-03 (×3): qty 2

## 2020-11-03 MED ORDER — METOPROLOL TARTRATE 12.5 MG HALF TABLET
12.5000 mg | ORAL_TABLET | Freq: Two times a day (BID) | ORAL | Status: DC
  Administered 2020-11-03 – 2020-11-04 (×2): 12.5 mg via ORAL
  Filled 2020-11-03 (×2): qty 1

## 2020-11-03 MED ORDER — SODIUM CHLORIDE 0.9 % IV SOLN: INTRAVENOUS | Status: AC

## 2020-11-03 MED ORDER — LEVOTHYROXINE SODIUM 50 MCG PO TABS
50.0000 ug | ORAL_TABLET | Freq: Every day | ORAL | Status: DC
  Administered 2020-11-04 – 2020-11-05 (×2): 50 ug via ORAL
  Filled 2020-11-03 (×2): qty 1

## 2020-11-03 MED ORDER — NITROGLYCERIN 0.4 MG SL SUBL
0.4000 mg | SUBLINGUAL_TABLET | SUBLINGUAL | Status: DC | PRN
Start: 2020-11-03 — End: 2020-11-05

## 2020-11-03 MED ORDER — CO Q-10 150 MG PO CAPS: 1.0000 | ORAL_CAPSULE | Freq: Two times a day (BID) | ORAL | Status: DC

## 2020-11-03 MED ORDER — CHLORHEXIDINE GLUCONATE CLOTH 2 % EX PADS
6.0000 | MEDICATED_PAD | Freq: Every day | CUTANEOUS | Status: DC
  Administered 2020-11-03 – 2020-11-04 (×3): 6 via TOPICAL

## 2020-11-03 MED ORDER — DEXTROSE 50 % IV SOLN
25.0000 mL | Freq: Once | INTRAVENOUS | Status: AC
  Administered 2020-11-03: 25 mL via INTRAVENOUS
  Filled 2020-11-03: qty 50

## 2020-11-03 MED ORDER — HYDRALAZINE HCL 10 MG PO TABS: 10.0000 mg | ORAL_TABLET | Freq: Three times a day (TID) | ORAL | Status: DC | PRN

## 2020-11-03 NOTE — Procedures (Signed)
Patient Name: HASNAIN MANHEIM, MD  MRN: [id number]  Epilepsy Attending: Lora Havens  Referring Physician/Provider: Dr. Shela Leff Date:  11/03/2020 Duration: 22.58 mins  Patient history: 87 year old male with altered mental status.  EEG to evaluate for seizures.  Level of alertness: Awake  AEDs during EEG study: Gabapentin  Technical aspects: This EEG study was done with scalp electrodes positioned according to the 10-20 International system of electrode placement. Electrical activity was acquired at a sampling rate of 500Hz  and reviewed with a high frequency filter of 70Hz  and a low frequency filter of 1Hz . EEG data were recorded continuously and digitally stored.   Description: The posterior dominant rhythm consists of 8-9 Hz activity of moderate voltage (25-35 uV) seen predominantly in posterior head regions, symmetric and reactive to eye opening and eye closing. Hyperventilation and photic stimulation were not performed.     IMPRESSION: This study is within normal limits. No seizures or epileptiform discharges were seen throughout the recording.  Ingvald Theisen [name]

## 2020-11-03 NOTE — Progress Notes (Signed)
Portable EEG completed, results pending. 

## 2020-11-03 NOTE — ED Notes (Signed)
Soft restraints no longer needed for patient safety based on reexamination. Pt is extremely restless when he is asleep, but calm and cooperative after being awaken and oriented. Seizure pads in place for safety.

## 2020-11-03 NOTE — Progress Notes (Signed)
Carotid duplex bilateral study completed.   Please see CV Proc for preliminary results.   Lewayne Pauley, RDMS  

## 2020-11-03 NOTE — Progress Notes (Addendum)
PROGRESS NOTE    James Spurr, MD  GGY:[id number] DOB: Apr 13, 1931 DOA: 11/02/2020 PCP: [name], MD    Chief Complaint  Patient presents with  . Loss of Consciousness  . Aphasia    Brief Narrative:  James Spurr, MD is a 87 y.o. male with medical history significant of CKD stage III, CVA with residual right hemiparesis, BPH, hypertension, hyperlipidemia, OSA on CPAP, insulin-dependent type 2 diabetes, RLS presented to the ED via EMS for evaluation of diplopia.  Patient was recently treated for UTI.  Family reported slurred speech which began this morning.  Patient presenting with multiple days of generalized fatigue, minimal confusion, and fell asleep today while he was on the toilet, fell asleep and hit his head but unclear if it was a syncopal event  Subjective:  He is drowsy, slow in answering questions but oriented x3 C/o restless leg , wants neurontin and mirapex restarted C/o bladder being full, reports h/o bph and urinary difficulty, ua on presentation no bacteria  Assessment & Plan:   Principal Problem:   Acute encephalopathy Active Problems:   CKD stage 3 secondary to diabetes ([place])   GERD (gastroesophageal reflux disease)   MDD (major depressive disorder), recurrent episode, moderate ([facility])   HTN (hypertension)     Acute toxic metabolic encephalopathy /diplopia [facility] Home weakness/reports falling asleep while on the commode and fell,  CT head no acute finding Suspect from polypharmacy, UA no infection Reports recently started on oxycodone and cutting down neurontin for restless leg For now will reduce Neurontin dose, continue Mirapex, hold oxycodone Continue syncope work-up, carotid ultrasound, echocardiogram, EEG  Orthostatic vital sign Systolic blood pressure dropped from 1 20-1 08 upon standing, heart rate increased from 62-72 upon standing Continue hydration Encourage oral intake  CKD 3B -With mild azotemia/creatinine mildly elevated  above baseline ( baseline creatinine appears as from 1.4-1.6 -Renal dosing meds  Insulin dependent type 2 diabetes with hyperglycemia A1c in process A.m. blood glucose 64 Hold Tresiba, hold Jardiance Continue SSI, continue hypoglycemia protocol, encourage oral intake   History of CVA with right-sided hemiparesis, continue Plavix, statin  HTN, blood pressure low normal  hold arb, continue lopressor with holding parameters  BPH Report urinary difficulty ,Foley placed in the ED Increase activity, moving bm, then voiding trial in am  Restless leg syndrome: [facility] reduce Neurontin dose  Hypothyroidism TSH 2.7 Continue Synthroid  OSA on cpap   FTT PT eval, snf?  Unresulted Labs (From admission, onward)          Start     Ordered   11/04/20 0500  CK  Tomorrow morning,   R       Question:  Specimen collection method  Answer:  Lab=Lab collect   11/03/20 1705   11/04/20 0500  CBC  Tomorrow morning,   R       Question:  Specimen collection method  Answer:  Lab=Lab collect   11/03/20 1710   11/04/20 0350  Basic metabolic panel  Tomorrow morning,   R       Question:  Specimen collection method  Answer:  Lab=Lab collect   11/03/20 1710   11/04/20 0500  Magnesium  Tomorrow morning,   R       Question:  Specimen collection method  Answer:  Lab=Lab collect   11/03/20 1710   11/03/20 0500  Hemoglobin A1c  Tomorrow morning,   R        11/02/20 2106  DVT prophylaxis: heparin injection 5,000 Units Start: 11/02/20 2200   Code Status: Full Family Communication: Called daughters cell phone no answer, will try tomorrow Disposition:   Status is: Inpatient  Dispo: The patient is from: Home              Anticipated d/c is to: Likely need skilled nursing facility pending PT eval              Anticipated d/c date is: Pending PT eval                Consultants:   None  Procedures:   None  Antimicrobials:    None     Objective: Vitals:   11/03/20 0424 11/03/20 0500 11/03/20 0535 11/03/20 0700  BP:   (!) 120/49 (!) 219/205  Pulse:   62 87  Resp:   18 18  Temp: 97.9 F (36.6 C)  98.4 F (36.9 C) (!) 97.4 F (36.3 C)  TempSrc: Axillary Oral Oral Oral  SpO2:   94% 94%  Weight:   82.3 kg   Height:        Intake/Output Summary (Last 24 hours) at 11/03/2020 0808 Last data filed at 11/03/2020 0600 Gross per 24 hour  Intake 1500 ml  Output 1650 ml  Net -150 ml   Filed Weights   11/02/20 1217 11/03/20 0535  Weight: 83.9 kg 82.3 kg    Examination:  General exam: appear drowsy, but AAOX3, calm, NAD Respiratory system: Clear to auscultation. Respiratory effort normal. Cardiovascular system: S1 & S2 heard, RRR. No JVD, no murmur, No pedal edema. Gastrointestinal system: Abdomen is nondistended, soft and nontender. No organomegaly or masses felt. Normal bowel sounds heard. Central nervous system: Alert and oriented. No focal neurological deficits. Extremities: Symmetric 5 x 5 power. Skin: No rashes, lesions or ulcers Psychiatry: Judgement and insight appear normal. Mood & affect appropriate.     Data Reviewed: I have personally reviewed following labs and imaging studies  CBC: Recent Labs  Lab 11/02/20 1200 11/02/20 2005  WBC 9.5  --   HGB 11.9* 12.6*  HCT 36.9* 37.0*  MCV 93.4  --   PLT 173  --     Basic Metabolic Panel: Recent Labs  Lab 11/02/20 1200 11/02/20 2005 11/03/20 0443  NA 134* 139 139  K 4.9 4.4 4.3  CL 105  --  109  CO2 22  --  21*  GLUCOSE 194*  --  64*  BUN 29*  --  25*  CREATININE 1.78*  --  1.74*  CALCIUM 8.1*  --  8.6*    GFR: Estimated Creatinine Clearance: 31.3 mL/min (A) (by C-G formula based on SCr of 1.74 mg/dL (H)).  Liver Function Tests: Recent Labs  Lab 11/02/20 1200  AST 24  ALT 16  ALKPHOS 39  BILITOT 0.2*  PROT 6.4*  ALBUMIN 3.1*    CBG: Recent Labs  Lab 11/02/20 1226 11/02/20 2014 11/02/20 2128 11/03/20 0354  11/03/20 0557  GLUCAP 163* 61* 93 62* 101*     Recent Results (from the past 240 hour(s))  SARS CORONAVIRUS 2 (TAT 6-24 HRS) Nasopharyngeal Nasopharyngeal Swab     Status: None   Collection Time: 11/02/20  5:04 PM   Specimen: Nasopharyngeal Swab  Result Value Ref Range Status   SARS Coronavirus 2 NEGATIVE NEGATIVE Final    Comment: (NOTE) SARS-CoV-2 target nucleic acids are NOT DETECTED.  The SARS-CoV-2 RNA is generally detectable in upper and lower respiratory specimens during the acute phase of  infection. Negative results do not preclude SARS-CoV-2 infection, do not rule out co-infections with other pathogens, and should not be used as the sole basis for treatment or other patient management decisions. Negative results must be combined with clinical observations, patient history, and epidemiological information. The expected result is Negative.  Fact Sheet for Patients: SugarRoll.be  Fact Sheet for Healthcare Providers: https://www.woods-mathews.com/  This test is not yet approved or cleared by the [place] FDA and  has been authorized for detection and/or diagnosis of SARS-CoV-2 by FDA under an Emergency Use Authorization (EUA). This EUA will remain  in effect (meaning this test can be used) for the duration of the COVID-19 declaration under Se ction 564(b)(1) of the Act, 21 U.S.C. section 360bbb-3(b)(1), unless the authorization is terminated or revoked sooner.  Performed at [facility], [place] [address], [place], [place] [address]          Radiology Studies: CT Head Wo Contrast  Result Date: 11/02/2020 CLINICAL DATA:  87 year old male with history of minor head trauma. Slurred speech. EXAM: CT HEAD WITHOUT CONTRAST TECHNIQUE: Contiguous axial images were obtained from the base of the skull through the vertex without intravenous contrast. COMPARISON:  Head CT 07/01/2020. FINDINGS: Brain: Mild cerebral atrophy.  Patchy and confluent areas of decreased attenuation are noted throughout the deep and periventricular white matter of the cerebral hemispheres bilaterally, compatible with chronic microvascular ischemic disease. Large old lacunar infarct in the left corona radiata and lentiform nucleus. No evidence of acute infarction, hemorrhage, hydrocephalus, extra-axial collection or mass lesion/mass effect. Vascular: No hyperdense vessel. Atherosclerotic calcifications in the vasculature at the skull base. Skull: Normal. Negative for fracture or focal lesion. Sinuses/Orbits: No acute finding. Other: None. IMPRESSION: 1. No acute intracranial abnormalities. 2. Mild cerebral atrophy with chronic microvascular ischemic changes and old left-sided lacunar infarct, similar to the prior study, as above. Electronically Signed   By: Vinnie Langton M.D.   On: 11/02/2020 13:19   MR BRAIN WO CONTRAST  Result Date: 11/02/2020 CLINICAL DATA:  Weakness and diplopia. EXAM: MRI HEAD WITHOUT CONTRAST TECHNIQUE: Multiplanar, multiecho pulse sequences of the brain and surrounding structures were obtained without intravenous contrast. COMPARISON:  Head CT 11/02/2020 and MRI 10/26/2018 FINDINGS: The patient was unable to tolerate the complete examination. Axial and coronal diffusion, axial FLAIR, axial T2, and sagittal T1 sequences were obtained and are moderately motion degraded. Brain: There is no evidence of an acute infarct, intracranial hemorrhage, mass, midline shift, or extra-axial fluid collection within limitations of motion artifact. A chronic infarct is again noted in the left basal ganglia and deep white matter with associated ex vacuo dilatation of the left lateral ventricle. There is mild cerebral atrophy. Vascular: Major intracranial vascular flow voids are preserved. Skull and upper cervical spine: No gross marrow lesion. Sinuses/Orbits: Bilateral cataract extraction. Paranasal sinuses and mastoid air cells are clear. Other:  None. IMPRESSION: 1. Motion degraded, incomplete examination. 2. No acute infarct. 3. Chronic left basal ganglia infarct. Electronically Signed   By: Logan Bores M.D.   On: 11/02/2020 18:54   DG Chest Port 1 View  Result Date: 11/02/2020 CLINICAL DATA:  Loss of consciousness. EXAM: PORTABLE CHEST 1 VIEW COMPARISON:  07/02/2020 FINDINGS: 1231 hours. The lungs are clear without focal pneumonia, edema, pneumothorax or pleural effusion. Interstitial markings are diffusely coarsened with chronic features. Cardiopericardial silhouette is at upper limits of normal for size. Bones are diffusely demineralized. Telemetry leads overlie the chest. IMPRESSION: No active disease. Electronically Signed   By:  Misty Stanley M.D.   On: 11/02/2020 12:55        Scheduled Meds: . Chlorhexidine Gluconate Cloth  6 each Topical Daily  . heparin  5,000 Units Subcutaneous Q8H   Continuous Infusions: . sodium chloride 125 mL/hr at 11/03/20 0610     LOS: 1 day   Time spent: 35 mins Greater than 50% of this time was spent in counseling, explanation of diagnosis, planning of further management, and coordination of care.   Voice Recognition Viviann Spare dictation system was used to create this note, attempts have been made to correct errors. Please contact the author with questions and/or clarifications.   Florencia Reasons, MD PhD FACP [facility]  Available via Epic secure chat 7am-7pm for nonurgent issues Please page for urgent issues To page the attending provider between 7A-7P or the covering provider during after hours 7P-7A, please log into the web site [email or web address] and access using universal Miranda password for that web site. If you do not have the password, please call the hospital operator.    11/03/2020, 8:08 AM

## 2020-11-03 NOTE — Progress Notes (Signed)
  Echocardiogram 2D Echocardiogram with definity has been performed.  Darlina Sicilian M 11/03/2020, 3:29 PM

## 2020-11-03 NOTE — ED Notes (Signed)
Report to 6E attempted

## 2020-11-03 NOTE — ED Notes (Signed)
Pt extremely restless when sleeping, but now easily arousable. Seizure pads still in place for safety. Daughter at bed side states extensive history of restless leg syndrome. Will follow up with pharm on med rec for medications.

## 2020-11-03 NOTE — Progress Notes (Signed)
Patient placed on CPAP with 3L bled in. Patient's pleasantly confused but is tolerating well at this time.

## 2020-11-04 ENCOUNTER — Inpatient Hospital Stay (HOSPITAL_COMMUNITY): Payer: Medicare Other

## 2020-11-04 LAB — MAGNESIUM: Magnesium: 2.2 mg/dL (ref 1.7–2.4)

## 2020-11-04 LAB — BASIC METABOLIC PANEL
Anion gap: 9 (ref 5–15)
BUN: 24 mg/dL — ABNORMAL HIGH (ref 8–23)
CO2: 22 mmol/L (ref 22–32)
Calcium: 8.4 mg/dL — ABNORMAL LOW (ref 8.9–10.3)
Chloride: 109 mmol/L (ref 98–111)
Creatinine, Ser: 1.98 mg/dL — ABNORMAL HIGH (ref 0.61–1.24)
GFR, Estimated: 32 mL/min — ABNORMAL LOW (ref >60)
Glucose, Bld: 148 mg/dL — ABNORMAL HIGH (ref 70–99)
Potassium: 4.4 mmol/L (ref 3.5–5.1)
Sodium: 140 mmol/L (ref 135–145)

## 2020-11-04 LAB — ECHOCARDIOGRAM COMPLETE
Area-P 1/2: 3.42 cm2
Height: 68 in
Weight: 2902.4 oz

## 2020-11-04 LAB — CBC
HCT: 34.7 % — ABNORMAL LOW (ref 39.0–52.0)
Hemoglobin: 11.7 g/dL — ABNORMAL LOW (ref 13.0–17.0)
MCH: 30.6 pg (ref 26.0–34.0)
MCHC: 33.7 g/dL (ref 30.0–36.0)
MCV: 90.8 fL (ref 80.0–100.0)
Platelets: 166 10*3/uL (ref 150–400)
RBC: 3.82 MIL/uL — ABNORMAL LOW (ref 4.22–5.81)
RDW: 12.6 % (ref 11.5–15.5)
WBC: 8.3 10*3/uL (ref 4.0–10.5)
nRBC: 0 % (ref 0.0–0.2)

## 2020-11-04 LAB — GLUCOSE, CAPILLARY
Glucose-Capillary: 143 mg/dL — ABNORMAL HIGH (ref 70–99)
Glucose-Capillary: 113 mg/dL — ABNORMAL HIGH (ref 70–99)
Glucose-Capillary: 98 mg/dL (ref 70–99)
Glucose-Capillary: 121 mg/dL — ABNORMAL HIGH (ref 70–99)
Glucose-Capillary: 208 mg/dL — ABNORMAL HIGH (ref 70–99)

## 2020-11-04 LAB — HEMOGLOBIN A1C
Hgb A1c MFr Bld: 8 % — ABNORMAL HIGH (ref 4.8–5.6)
Mean Plasma Glucose: 183 mg/dL

## 2020-11-04 LAB — CK: Total CK: 352 U/L (ref 49–397)

## 2020-11-04 MED ORDER — METOPROLOL TARTRATE 25 MG PO TABS
25.0000 mg | ORAL_TABLET | Freq: Two times a day (BID) | ORAL | Status: DC
  Administered 2020-11-04 – 2020-11-05 (×2): 25 mg via ORAL
  Filled 2020-11-04 (×2): qty 1

## 2020-11-04 MED ORDER — SODIUM CHLORIDE 0.9 % IV SOLN: INTRAVENOUS | Status: AC

## 2020-11-04 NOTE — Progress Notes (Addendum)
PROGRESS NOTE    James Spurr, MD  BMW:[id number] DOB: July 05, 1931 DOA: 11/02/2020 PCP: [name], MD    Chief Complaint  Patient presents with  . Loss of Consciousness  . Aphasia    Brief Narrative:  James Spurr, MD is a 87 y.o. male with medical history significant of CKD stage III, CVA with residual right hemiparesis, BPH, hypertension, hyperlipidemia, OSA on CPAP, insulin-dependent type 2 diabetes, RLS presented to the ED via EMS for evaluation of diplopia.  Patient was recently treated for UTI.  Family reported slurred speech which began this morning.  Patient presenting with multiple days of generalized fatigue, minimal confusion, and fell asleep today while he was on the toilet, fell asleep and hit his head but unclear if it was a syncopal event  Subjective:  Cr went up, he continue to appear weak, but more alert and talking faster Reports restless leg symptoms is better after restarted on  neurontin and mirapex   urinary difficulty, ua on presentation no bacteria  Assessment & Plan:   Principal Problem:   Acute encephalopathy Active Problems:   CKD stage 3 secondary to diabetes ([place])   GERD (gastroesophageal reflux disease)   MDD (major depressive disorder), recurrent episode, moderate (HCC)   HTN (hypertension)     Acute toxic metabolic encephalopathy /diplopia [facility] Home weakness/reports falling asleep while on the commode and fell,  CT head no acute finding Suspect from polypharmacy, UA no infection Reports recently started on oxycodone and cutting down neurontin for restless leg For now will reduce Neurontin dose, continue Mirapex, hold oxycodone carotid ultrasound, echocardiogram, EEG no acute findings He appears slowly improving, continue current regimen,   Orthostatic vital sign Systolic blood pressure dropped from 120-108 upon standing, heart rate increased from 62-72 upon standing Continue hydration Encourage oral  intake improving  AKI on CKD 3B -ua on presentation no bacteria -With mild azotemia/creatinine mildly elevated above baseline ( baseline creatinine appears as from 1.4-1.6 -cr went up to 1.98, ck unremarkable, continue ivf, check renal US, Renal dosing meds  H/o BPH s/p TURP  reports recently having urinary difficulty, foley placed in the ED Will do voiding trial, bladder scan to measure PVR Start flomax Increase activity, out of bad  Insulin dependent type 2 diabetes uncontrolled with hypoglycemia A1c 8 A.m. blood glucose 64 on 2/16 Hold Tresiba, hold Jardiance Continue SSI, continue hypoglycemia protocol, encourage oral intake Am blood glucose 148 this am, likely able to restart long active insulin tomorrow  History of CVA with right-sided hemiparesis, continue Plavix, statin  HTN, blood pressure low normal with aki Blood pressure improving, increase lopressor, continue hold arb due to [facility]  BPH Report urinary difficulty ,Foley placed in the ED Increase activity, moving bm, then voiding trial in am  Restless leg syndrome:  Followed by neurology at [facility], reduce Neurontin dose ( renally dosing), hold oxycodone due to drowsiness   Hypothyroidism TSH 2.7 Continue Synthroid  OSA on cpap   FTT PT eval recommended home health  Unresulted Labs (From admission, onward)          Start     Ordered   11/05/20 2725  Basic metabolic panel  Tomorrow morning,   R       Question:  Specimen collection method  Answer:  Lab=Lab collect   11/04/20 1648             DVT prophylaxis: heparin injection 5,000 Units Start: 11/02/20 2200   Code Status: Full Family Communication: daughter  over the phone Disposition:   Status is: Inpatient  Dispo: The patient is from: Home              Anticipated d/c is to: home with home health              Anticipated d/c date is: 24-48hrs, pending cr improvement                Consultants:   None  Procedures:    None  Antimicrobials:   None     Objective: Vitals:   11/04/20 0508 11/04/20 0917 11/04/20 1011 11/04/20 1638  BP:   (!) 140/57 (!) 156/110  Pulse:   72 75  Resp:  16 14 20   Temp:  98.7 F (37.1 C)  97.6 F (36.4 C)  TempSrc:  Oral  Oral  SpO2:   100% 97%  Weight: 83 kg     Height:        Intake/Output Summary (Last 24 hours) at 11/04/2020 1648 Last data filed at 11/04/2020 1600 Gross per 24 hour  Intake 2652.9 ml  Output 2630 ml  Net 22.9 ml   Filed Weights   11/02/20 1217 11/03/20 0535 11/04/20 0508  Weight: 83.9 kg 82.3 kg 83 kg    Examination:  General exam: appear more alert and less slow,  AAOX3, calm, NAD Respiratory system: Clear to auscultation. Respiratory effort normal. Cardiovascular system: S1 & S2 heard, RRR. No JVD, no murmur, No pedal edema. Gastrointestinal system: Abdomen is nondistended, soft and nontender. No organomegaly or masses felt. Normal bowel sounds heard. Central nervous system: Alert and orientedx3. No focal neurological deficits. Extremities: Symmetric 5 x 5 power. Skin: No rashes, lesions or ulcers Psychiatry: Judgement and insight appear normal. Mood & affect appropriate.     Data Reviewed: I have personally reviewed following labs and imaging studies  CBC: Recent Labs  Lab 11/02/20 1200 11/02/20 2005 11/04/20 0148  WBC 9.5  --  8.3  HGB 11.9* 12.6* 11.7*  HCT 36.9* 37.0* 34.7*  MCV 93.4  --  90.8  PLT 173  --  607    Basic Metabolic Panel: Recent Labs  Lab 11/02/20 1200 11/02/20 2005 11/03/20 0443 11/04/20 0148  NA 134* 139 139 140  K 4.9 4.4 4.3 4.4  CL 105  --  109 109  CO2 22  --  21* 22  GLUCOSE 194*  --  64* 148*  BUN 29*  --  25* 24*  CREATININE 1.78*  --  1.74* 1.98*  CALCIUM 8.1*  --  8.6* 8.4*  MG  --   --   --  2.2    GFR: Estimated Creatinine Clearance: 27.6 mL/min (A) (by C-G formula based on SCr of 1.98 mg/dL (H)).  Liver Function Tests: Recent Labs  Lab 11/02/20 1200  AST 24  ALT  16  ALKPHOS 39  BILITOT 0.2*  PROT 6.4*  ALBUMIN 3.1*    CBG: Recent Labs  Lab 11/03/20 2107 11/04/20 0101 11/04/20 0458 11/04/20 0812 11/04/20 1218  GLUCAP 174* 143* 113* 98 121*     Recent Results (from the past 240 hour(s))  SARS CORONAVIRUS 2 (TAT 6-24 HRS) Nasopharyngeal Nasopharyngeal Swab     Status: None   Collection Time: 11/02/20  5:04 PM   Specimen: Nasopharyngeal Swab  Result Value Ref Range Status   SARS Coronavirus 2 NEGATIVE NEGATIVE Final    Comment: (NOTE) SARS-CoV-2 target nucleic acids are NOT DETECTED.  The SARS-CoV-2 RNA is generally detectable in upper and  lower respiratory specimens during the acute phase of infection. Negative results do not preclude SARS-CoV-2 infection, do not rule out co-infections with other pathogens, and should not be used as the sole basis for treatment or other patient management decisions. Negative results must be combined with clinical observations, patient history, and epidemiological information. The expected result is Negative.  Fact Sheet for Patients: SugarRoll.be  Fact Sheet for Healthcare Providers: https://www.woods-mathews.com/  This test is not yet approved or cleared by the [place] FDA and  has been authorized for detection and/or diagnosis of SARS-CoV-2 by FDA under an Emergency Use Authorization (EUA). This EUA will remain  in effect (meaning this test can be used) for the duration of the COVID-19 declaration under Se ction 564(b)(1) of the Act, 21 U.S.C. section 360bbb-3(b)(1), unless the authorization is terminated or revoked sooner.  Performed at [facility], [place] [address], [place], [place] [address]          Radiology Studies: MR BRAIN WO CONTRAST  Result Date: 11/02/2020 CLINICAL DATA:  Weakness and diplopia. EXAM: MRI HEAD WITHOUT CONTRAST TECHNIQUE: Multiplanar, multiecho pulse sequences of the brain and surrounding structures were  obtained without intravenous contrast. COMPARISON:  Head CT 11/02/2020 and MRI 10/26/2018 FINDINGS: The patient was unable to tolerate the complete examination. Axial and coronal diffusion, axial FLAIR, axial T2, and sagittal T1 sequences were obtained and are moderately motion degraded. Brain: There is no evidence of an acute infarct, intracranial hemorrhage, mass, midline shift, or extra-axial fluid collection within limitations of motion artifact. A chronic infarct is again noted in the left basal ganglia and deep white matter with associated ex vacuo dilatation of the left lateral ventricle. There is mild cerebral atrophy. Vascular: Major intracranial vascular flow voids are preserved. Skull and upper cervical spine: No gross marrow lesion. Sinuses/Orbits: Bilateral cataract extraction. Paranasal sinuses and mastoid air cells are clear. Other: None. IMPRESSION: 1. Motion degraded, incomplete examination. 2. No acute infarct. 3. Chronic left basal ganglia infarct. Electronically Signed   By: Logan Bores M.D.   On: 11/02/2020 18:54   US RENAL  Result Date: 11/04/2020 CLINICAL DATA:  Elevated creatinine level. EXAM: RENAL / URINARY TRACT ULTRASOUND COMPLETE COMPARISON:  February 20, 2014. FINDINGS: Right Kidney: Renal measurements: 11.1 x 6.2 x 5.0 cm = volume: 180 mL. Partially exophytic 2.4 cm simple cyst is seen arising from midpole. Echogenicity within normal limits. No mass or hydronephrosis visualized. Left Kidney: Renal measurements: 11.6 x 6.0 by 4.1 cm = volume: 149 mL. 4.4 cm bilobed cyst is noted in upper pole. Echogenicity within normal limits. No mass or hydronephrosis visualized. Bladder: Decompressed secondary to Foley catheter. Other: None. IMPRESSION: Bilateral simple renal cysts. No hydronephrosis or renal obstruction is noted. Electronically Signed   By: Marijo Conception M.D.   On: 11/04/2020 15:59   EEG adult  Result Date: 11/03/2020 Lora Havens, MD     11/03/2020  1:13 PM Patient Name:  James Spurr, MD MRN: [id number] Epilepsy Attending: Lora Havens Referring Physician/Provider: Dr. Shela Leff Date:  11/03/2020 Duration: 22.58 mins Patient history: 87 year old male with altered mental status.  EEG to evaluate for seizures. Level of alertness: Awake AEDs during EEG study: Gabapentin Technical aspects: This EEG study was done with scalp electrodes positioned according to the 10-20 International system of electrode placement. Electrical activity was acquired at a sampling rate of 500Hz  and reviewed with a high frequency filter of 70Hz  and a low frequency filter of 1Hz . EEG data were  recorded continuously and digitally stored. Description: The posterior dominant rhythm consists of 8-9 Hz activity of moderate voltage (25-35 uV) seen predominantly in posterior head regions, symmetric and reactive to eye opening and eye closing. Hyperventilation and photic stimulation were not performed.   IMPRESSION: This study is within normal limits. No seizures or epileptiform discharges were seen throughout the recording. Lora Havens   ECHOCARDIOGRAM COMPLETE  Result Date: 11/04/2020    ECHOCARDIOGRAM REPORT   Patient Name:   JEAN SKOW [facility] Date of Exam: 11/03/2020 Medical Rec #:  [id number]          Height:       68.0 in Accession #:    [id number]         Weight:       181.4 lb Date of Birth:  Sep 20, 1930         BSA:          1.961 m Patient Age:    52 years           BP:           114/63 mmHg Patient Gender: M                  HR:           86 bpm. Exam Location:  Inpatient Procedure: 2D Echo, Color Doppler, Cardiac Doppler and Intracardiac            Opacification Agent Indications:    Syncope 780.2 / R55  History:        Patient has prior history of Echocardiogram examinations, most                 recent 03/19/2015. Stroke and TIA, Arrythmias:PAC, RBBB and                 Palpitations; Risk Factors:Dyslipidemia, Diabetes and Sleep                 Apnea. Chronic kidney disease. GERD.   Sonographer:    Darlina Sicilian RDCS Referring Phys: [id number] [place]  1. Left ventricular ejection fraction, by estimation, is 60 to 65%. The left ventricle has normal function. The left ventricle has no regional wall motion abnormalities. Left ventricular diastolic parameters are indeterminate.  2. Right ventricular systolic function was not well visualized. The right ventricular size is not well visualized. Poor RV visualization but grossly normal function. Tricuspid regurgitation signal is inadequate for assessing PA pressure.  3. The mitral valve is normal in structure. No evidence of mitral valve regurgitation.  4. The aortic valve was not well visualized. Aortic valve regurgitation is not visualized. No aortic stenosis is present.  5. The inferior vena cava is dilated in size with >50% respiratory variability, suggesting right atrial pressure of 8 mmHg. FINDINGS  Left Ventricle: Left ventricular ejection fraction, by estimation, is 60 to 65%. The left ventricle has normal function. The left ventricle has no regional wall motion abnormalities. Definity contrast agent was given IV to delineate the left ventricular  endocardial borders. The left ventricular internal cavity size was normal in size. There is no left ventricular hypertrophy. Left ventricular diastolic parameters are indeterminate. Right Ventricle: The right ventricular size is not well visualized. Right vetricular wall thickness was not well visualized. Right ventricular systolic function was not well visualized. Tricuspid regurgitation signal is inadequate for assessing PA pressure. Left Atrium: Left atrial size was not well visualized. Right Atrium: Right atrial size was not well visualized. Pericardium: There  is no evidence of pericardial effusion. Mitral Valve: The mitral valve is normal in structure. No evidence of mitral valve regurgitation. Tricuspid Valve: The tricuspid valve is normal in structure. Tricuspid valve  regurgitation is not demonstrated. Aortic Valve: The aortic valve was not well visualized. Aortic valve regurgitation is not visualized. No aortic stenosis is present. Pulmonic Valve: The pulmonic valve was not well visualized. Pulmonic valve regurgitation is not visualized. Aorta: The aortic root is normal in size and structure. Venous: The inferior vena cava is dilated in size with greater than 50% respiratory variability, suggesting right atrial pressure of 8 mmHg. IAS/Shunts: The interatrial septum was not well visualized.  LEFT VENTRICLE PLAX 2D LVOT diam:     2.10 cm  Diastology LV SV:         45       LV e' medial:    6.64 cm/s LV SV Index:   23       LV E/e' medial:  9.4 LVOT Area:     3.46 cm LV e' lateral:   6.53 cm/s                         LV E/e' lateral: 9.6  AORTIC VALVE LVOT Vmax:   88.70 cm/s LVOT Vmean:  57.700 cm/s LVOT VTI:    0.130 m  AORTA Ao Root diam: 3.30 cm MITRAL VALVE MV Area (PHT): 3.42 cm    SHUNTS MV Decel Time: 222 msec    Systemic VTI:  0.13 m MV E velocity: 62.60 cm/s  Systemic Diam: 2.10 cm MV A velocity: 77.10 cm/s MV E/A ratio:  0.81 Oswaldo Milian MD Electronically signed by Oswaldo Milian MD Signature Date/Time: 11/04/2020/12:09:20 AM    Final    VAS US CAROTID  Result Date: 11/03/2020 Carotid Arterial Duplex Study Indications:       Syncope, diplopia, slurred speech. Risk Factors:      Hypertension, hyperlipidemia, Diabetes, prior CVA. Comparison Study:  03-19-2015 Prior carotid duplex study showed 1-39% stenosis                    bilaterally. Performing Technologist: Darlin Coco RDMS  Examination Guidelines: A complete evaluation includes B-mode imaging, spectral Doppler, color Doppler, and power Doppler as needed of all accessible portions of each vessel. Bilateral testing is considered an integral part of a complete examination. Limited examinations for reoccurring indications may be performed as noted.  Right Carotid Findings:  +----------+--------+--------+--------+------------------+------------------+           PSV cm/sEDV cm/sStenosisPlaque DescriptionComments           +----------+--------+--------+--------+------------------+------------------+ CCA Prox  83      12                                                   +----------+--------+--------+--------+------------------+------------------+ CCA Distal103     14                                                   +----------+--------+--------+--------+------------------+------------------+ ICA Prox  76      21      1-39%  intimal thickening +----------+--------+--------+--------+------------------+------------------+ ICA Distal127     31                                                   +----------+--------+--------+--------+------------------+------------------+ ECA       135                                                          +----------+--------+--------+--------+------------------+------------------+ +----------+--------+-------+----------------+-------------------+           PSV cm/sEDV cmsDescribe        Arm Pressure (mmHG) +----------+--------+-------+----------------+-------------------+ [id number]            Multiphasic, WNL                    +----------+--------+-------+----------------+-------------------+ +---------+--------+--+--------+--+---------+ VertebralPSV cm/s77EDV cm/s10Antegrade +---------+--------+--+--------+--+---------+  Left Carotid Findings: +----------+--------+--------+--------+-----------------------+--------+           PSV cm/sEDV cm/sStenosisPlaque Description     Comments +----------+--------+--------+--------+-----------------------+--------+ CCA Prox  137     10                                              +----------+--------+--------+--------+-----------------------+--------+ CCA Distal103     14              heterogenous and smooth          +----------+--------+--------+--------+-----------------------+--------+ ICA Prox  147     18      1-39%   heterogenous and smooth         +----------+--------+--------+--------+-----------------------+--------+ ICA Distal100     25                                              +----------+--------+--------+--------+-----------------------+--------+ ECA       248                                                     +----------+--------+--------+--------+-----------------------+--------+ +----------+--------+--------+----------------+-------------------+           PSV cm/sEDV cm/sDescribe        Arm Pressure (mmHG) +----------+--------+--------+----------------+-------------------+ [id number]             Multiphasic, WNL                    +----------+--------+--------+----------------+-------------------+ +---------+--------+---+--------+--+---------+ VertebralPSV cm/s109EDV cm/s12Antegrade +---------+--------+---+--------+--+---------+   Summary: Right Carotid: Velocities in the right ICA are consistent with a 1-39% stenosis. Left Carotid: Velocities in the left ICA are consistent with a 1-39% stenosis. Vertebrals:  Bilateral vertebral arteries demonstrate antegrade flow. Subclavians: Normal flow hemodynamics were seen in bilateral subclavian              arteries. *See table(s) above for measurements and observations.  Electronically signed by Antony Contras MD on 11/03/2020 at 12:38:21 PM.    Final  Scheduled Meds: . vitamin C  1,000 mg Oral Daily  . Chlorhexidine Gluconate Cloth  6 each Topical Daily  . clopidogrel  75 mg Oral Daily  . gabapentin  100 mg Oral BID  . heparin  5,000 Units Subcutaneous Q8H  . insulin aspart  0-9 Units Subcutaneous TID WC  . latanoprost  1 drop Right Eye QHS  . levothyroxine  50 mcg Oral QAC breakfast  . metoprolol tartrate  25 mg Oral BID  . pramipexole  1 mg Oral QHS  . rosuvastatin  40 mg Oral Daily  . senna-docusate   1 tablet Oral BID   Continuous Infusions: . sodium chloride 125 mL/hr at 11/04/20 1050     LOS: 2 days   Time spent: 35 mins Greater than 50% of this time was spent in counseling, explanation of diagnosis, planning of further management, and coordination of care.   Voice Recognition Viviann Spare dictation system was used to create this note, attempts have been made to correct errors. Please contact the author with questions and/or clarifications.   Florencia Reasons, MD PhD FACP [facility]  Available via Epic secure chat 7am-7pm for nonurgent issues Please page for urgent issues To page the attending provider between 7A-7P or the covering provider during after hours 7P-7A, please log into the web site [email or web address] and access using universal [place] password for that web site. If you do not have the password, please call the hospital operator.    11/04/2020, 4:48 PM

## 2020-11-04 NOTE — Evaluation (Signed)
Physical Therapy Evaluation Patient Details Name: James DANH, MD MRN: [id number] DOB: 1931-02-27 Today's Date: 11/04/2020   History of Present Illness  87 y.o. male with medical history significant of CKD stage III, CVA with residual right hemiparesis, depression, GERD, BPH, hypertension, hyperlipidemia, OSA on CPAP, insulin-dependent type 2 diabetes, RLS presented to the ED via EMS for evaluation of diplopia.  Patient was recently treated for UTI.  Family reported slurred speech which began this morning.  Patient presenting with multiple days of generalized fatigue, minimal confusion, and fell asleep today while he was on the toilet, fell asleep and hit his head but unclear if it was a syncopal event. CT negative. MRI negative, but motion degraded.    Clinical Impression  Pt admitted with above diagnosis. PTA pt mod I mobility using rollator vs cane. On eval, he required min guard assist bed mobility, min assist sit to stand, and min guard assist ambulation 150' with RW. Pt currently with functional limitations due to the deficits listed below (see PT Problem List). Pt will benefit from skilled PT to increase their independence and safety with mobility to allow discharge to the venue listed below.       Follow Up Recommendations Home health PT    Equipment Recommendations  None recommended by PT    Recommendations for Other Services       Precautions / Restrictions Precautions Precautions: Fall      Mobility  Bed Mobility Overal bed mobility: Needs Assistance Bed Mobility: Supine to Sit     Supine to sit: HOB elevated;Min guard     General bed mobility comments: min guard for safety, +rail, increased time and effort    Transfers Overall transfer level: Needs assistance Equipment used: Rolling walker (2 wheeled) Transfers: Sit to/from Stand Sit to Stand: Min assist         General transfer comment: assist to power up from low surface, min guard assist from  elevated surface. Increased time to stabilize initial balance  Ambulation/Gait Ambulation/Gait assistance: Min guard Gait Distance (Feet): 150 Feet Assistive device: Rolling walker (2 wheeled) Gait Pattern/deviations: Step-through pattern;Decreased weight shift to right Gait velocity: decreased Gait velocity interpretation: <1.31 ft/sec, indicative of household ambulator General Gait Details: min guard for safety, no LOB noted. Pt declined need for AFO; therefore, not donned. Pt able to clear R foot without difficulty.  Stairs            Wheelchair Mobility    Modified Rankin (Stroke Patients Only)       Balance Overall balance assessment: Needs assistance Sitting-balance support: No upper extremity supported;Feet supported Sitting balance-Leahy Scale: Good     Standing balance support: Bilateral upper extremity supported;During functional activity Standing balance-Leahy Scale: Poor Standing balance comment: reliant on external support                             Pertinent Vitals/Pain Pain Assessment: Faces Faces Pain Scale: Hurts little more Pain Location: headache Pain Descriptors / Indicators: Headache Pain Intervention(s): Monitored during session;Repositioned    Home Living Family/patient expects to be discharged to:: Private residence Living Arrangements: Alone Available Help at Discharge: Family;Available PRN/intermittently;Personal care attendant Type of Home: Independent living facility (retirement community) Home Access: Level entry     Home Layout: One level Home Equipment: Environmental consultant - 4 wheels;Cane - quad;Grab bars - tub/shower;Shower seat;Wheelchair - power      Prior Function Level of Independence: Needs assistance   Gait /  Transfers Assistance Needed: Mod I using cane or rollator household distance and sometimes community distances. Does have power w/c when needed to manage distance.  ADL's / Homemaking Assistance Needed: PCA 3x week  for iADLs (meal prep and housekeeping).  Comments: Pt still drives.     Hand Dominance   Dominant Hand: Left    Extremity/Trunk Assessment   Upper Extremity Assessment Upper Extremity Assessment: RUE deficits/detail RUE Deficits / Details: h/o CVA with R residual deficits    Lower Extremity Assessment Lower Extremity Assessment: RLE deficits/detail RLE Deficits / Details: h/o CVA with R residual deficits, AFO at baseline       Communication   Communication: No difficulties  Cognition Arousal/Alertness: Awake/alert Behavior During Therapy: WFL for tasks assessed/performed Overall Cognitive Status: Within Functional Limits for tasks assessed                                 General Comments: mild confusion noted but appears to be clearing. Cognition intact for basic tasks.      General Comments General comments (skin integrity, edema, etc.): VSS on RA    Exercises     Assessment/Plan    PT Assessment Patient needs continued PT services  PT Problem List Decreased strength;Decreased mobility;Decreased activity tolerance;Pain;Decreased balance       PT Treatment Interventions Therapeutic activities;Gait training;Therapeutic exercise;Patient/family education;Balance training;Functional mobility training    PT Goals (Current goals can be found in the Care Plan section)  Acute Rehab PT Goals Patient Stated Goal: home PT Goal Formulation: With patient Time For Goal Achievement: 11/18/20 Potential to Achieve Goals: Good    Frequency Min 3X/week   Barriers to discharge        Co-evaluation               AM-PAC PT "6 Clicks" Mobility  Outcome Measure Help needed turning from your back to your side while in a flat bed without using bedrails?: A Little Help needed moving from lying on your back to sitting on the side of a flat bed without using bedrails?: A Little Help needed moving to and from a bed to a chair (including a wheelchair)?: A  Little Help needed standing up from a chair using your arms (e.g., wheelchair or bedside chair)?: A Little Help needed to walk in hospital room?: A Little Help needed climbing 3-5 steps with a railing? : A Lot 6 Click Score: 17    End of Session Equipment Utilized During Treatment: Gait belt Activity Tolerance: Patient tolerated treatment well Patient left: in chair;with call bell/phone within reach;with chair alarm set Nurse Communication: Mobility status PT Visit Diagnosis: Difficulty in walking, not elsewhere classified (R26.2);Muscle weakness (generalized) (M62.81)    Time: 9678-9381 PT Time Calculation (min) (ACUTE ONLY): 27 min   Charges:   PT Evaluation $PT Eval Moderate Complexity: 1 Mod PT Treatments $Gait Training: 8-22 mins        Lorrin Goodell, PT  Office # [phone] Pager [phone]   Lorriane Shire 11/04/2020, 9:19 AM

## 2020-11-04 NOTE — Plan of Care (Signed)
  Problem: Education: Goal: Knowledge of General Education information will improve Description Including pain rating scale, medication(s)/side effects and non-pharmacologic comfort measures Outcome: Progressing   Problem: Health Behavior/Discharge Planning: Goal: Ability to manage health-related needs will improve Outcome: Progressing   Problem: Clinical Measurements: Goal: Will remain free from infection Outcome: Progressing Goal: Diagnostic test results will improve Outcome: Progressing   Problem: Activity: Goal: Risk for activity intolerance will decrease Outcome: Progressing   Problem: Nutrition: Goal: Adequate nutrition will be maintained Outcome: Progressing   

## 2020-11-04 NOTE — Plan of Care (Signed)
  Problem: Education: Goal: Knowledge of General Education information will improve Description: Including pain rating scale, medication(s)/side effects and non-pharmacologic comfort measures Outcome: Progressing   Problem: Health Behavior/Discharge Planning: Goal: Ability to manage health-related needs will improve Outcome: Progressing   Problem: Clinical Measurements: Goal: Ability to maintain clinical measurements within normal limits will improve Outcome: Progressing Goal: Will remain free from infection Outcome: Progressing Goal: Diagnostic test results will improve Outcome: Progressing Goal: Cardiovascular complication will be avoided Outcome: Progressing   Problem: Pain Managment: Goal: General experience of comfort will improve Outcome: Progressing   Problem: Safety: Goal: Ability to remain free from injury will improve Outcome: Progressing

## 2020-11-05 ENCOUNTER — Telehealth: Payer: Self-pay

## 2020-11-05 LAB — GLUCOSE, CAPILLARY
Glucose-Capillary: 183 mg/dL — ABNORMAL HIGH (ref 70–99)
Glucose-Capillary: 145 mg/dL — ABNORMAL HIGH (ref 70–99)
Glucose-Capillary: 161 mg/dL — ABNORMAL HIGH (ref 70–99)

## 2020-11-05 LAB — BASIC METABOLIC PANEL
Anion gap: 7 (ref 5–15)
BUN: 21 mg/dL (ref 8–23)
CO2: 20 mmol/L — ABNORMAL LOW (ref 22–32)
Calcium: 8 mg/dL — ABNORMAL LOW (ref 8.9–10.3)
Chloride: 110 mmol/L (ref 98–111)
Creatinine, Ser: 1.57 mg/dL — ABNORMAL HIGH (ref 0.61–1.24)
GFR, Estimated: 42 mL/min — ABNORMAL LOW (ref >60)
Glucose, Bld: 204 mg/dL — ABNORMAL HIGH (ref 70–99)
Potassium: 4.1 mmol/L (ref 3.5–5.1)
Sodium: 137 mmol/L (ref 135–145)

## 2020-11-05 MED ORDER — TAMSULOSIN HCL 0.4 MG PO CAPS
0.4000 mg | ORAL_CAPSULE | Freq: Every day | ORAL | Status: DC
  Administered 2020-11-05: 0.4 mg via ORAL
  Filled 2020-11-05: qty 1

## 2020-11-05 MED ORDER — OXYCODONE HCL 5 MG PO TABS
5.0000 mg | ORAL_TABLET | Freq: Every evening | ORAL | 0 refills | Status: DC | PRN
Start: 2020-11-05 — End: 2020-11-10

## 2020-11-05 MED ORDER — SENNOSIDES-DOCUSATE SODIUM 8.6-50 MG PO TABS: 1.0000 | ORAL_TABLET | Freq: Every day | ORAL | 0 refills | Status: DC

## 2020-11-05 MED ORDER — ONDANSETRON HCL 4 MG/2ML IJ SOLN
4.0000 mg | Freq: Four times a day (QID) | INTRAMUSCULAR | Status: DC | PRN
  Administered 2020-11-05: 4 mg via INTRAVENOUS
  Filled 2020-11-05: qty 2

## 2020-11-05 MED ORDER — TAMSULOSIN HCL 0.4 MG PO CAPS: 0.4000 mg | ORAL_CAPSULE | Freq: Every day | ORAL | 0 refills | Status: DC

## 2020-11-05 MED ORDER — VALSARTAN 40 MG PO TABS: 20.0000 mg | ORAL_TABLET | Freq: Every day | ORAL | Status: DC

## 2020-11-05 NOTE — Progress Notes (Signed)
[facility] ([facility])  Hospital Liaison RN note         Notified by TOC manager of patient/family request for [facility] Palliative services at home after discharge.              [facility] Palliative team will follow up with patient after discharge.         Please call with any hospice or palliative related questions.         Thank you for the opportunity to participate in this patient's care.     Chrislyn King, BSN, RN [facility] Hospital Liaison (listed on [facility] under Hospice/[facility])    [phone] (24h on call)    

## 2020-11-05 NOTE — TOC Initial Note (Signed)
Transition of Care [facility]) - Initial/Assessment Note    Patient Details  Name: James TANGEN, MD MRN: [id number] Date of Birth: 05/25/1931  [facility]) CM/SW Contact:    Bethena Roys, RN Phone Number: 11/05/2020, 2:02 PM  Clinical Narrative:  Plan for transition home. Prior to arrival patient was from home alone and has support of family that checks in on him. Patient in need of a bedside commode-ordered via [employer] and delivered to the room. Case Manager discussed home health services and patient is agreeable to home health Physical Therapy- could not remember the agency he used before; however, wanted an agency in network. Patient did not have a preference. Case Manager called [facility] and they can provide services for the patient- start of care to begin within 24-48 hours post transition home. Daughter picked up patient via private vehicle.                  Expected Discharge Plan: [place] Barriers to Discharge: No Barriers Identified   Patient Goals and CMS Choice Patient states their goals for this hospitalization and ongoing recovery are:: to return home   Choice offered to / list presented to : NA (Could not remember the last agency-wanted one in network. No preference.)  Expected Discharge Plan and Services Expected Discharge Plan: [place] In-house Referral: NA Discharge Planning Services: CM Consult Post Acute Care Choice: Home Health,Durable Medical Equipment Living arrangements for the past 2 months: Single Family Home Expected Discharge Date: 11/05/20               DME Arranged: 3-N-1 DME Agency: [facility] Date DME Agency Contacted: 11/05/20 Time DME Agency Contacted: 1000 Representative spoke with at DME Agency: Freda Munro HH Arranged: PT [place] Date [place]: 11/05/20 Time [place]: 85 Representative spoke with at [place]: Marissa  Arrangements/Services Living arrangements for the past 2 months: [place] with:: Self (children check in on him') Patient language and need for interpreter reviewed:: Yes Do you feel safe going back to the place where you live?: Yes      Need for Family Participation in Patient Care: Yes (Comment) Care giver support system in place?: Yes (comment)   Criminal Activity/Legal Involvement Pertinent to Current Situation/Hospitalization: No - Comment as needed  Activities of Daily Living Home Assistive Devices/Equipment: CPAP,Walker (specify type) ADL Screening (condition at time of admission) Patient's cognitive ability adequate to safely complete daily activities?: Yes Is the patient deaf or have difficulty hearing?: Yes Does the patient have difficulty seeing, even when wearing glasses/contacts?: No Does the patient have difficulty concentrating, remembering, or making decisions?: No Patient able to express need for assistance with ADLs?: Yes Does the patient have difficulty dressing or bathing?: No Independently performs ADLs?: Yes (appropriate for developmental age) Does the patient have difficulty walking or climbing stairs?: Yes Weakness of Legs: Both Weakness of Arms/Hands: None  Permission Sought/Granted Permission sought to share information with : Family Supports,Case Nature conservation officer Permission granted to share information with : Yes, Verbal Permission Granted     Permission granted to share info w AGENCY: [facility]        Emotional Assessment Appearance:: Appears stated age Attitude/Demeanor/Rapport: Engaged Affect (typically observed): Appropriate Orientation: : Oriented to Situation,Oriented to Place,Oriented to Self,Oriented to  Time Alcohol / Substance Use: Not Applicable Psych Involvement: No (comment)  Admission diagnosis:  Diplopia [H53.2] Delirium [  R41.0] Fall [W19.XXXA] Acute encephalopathy [G93.40] AKI  (acute kidney injury) ([place]) [N17.9] Syncope, unspecified syncope type [R55] Patient Active Problem List   Diagnosis Date Noted  . Acute encephalopathy 11/02/2020  . Dysuria 10/18/2020  . Nausea 10/18/2020  . Fever 07/02/2020  . Healthcare maintenance 06/01/2020  . Involuntary movements 04/25/2020  . Post-nasal drainage 07/16/2019  . Recurrent productive cough 05/31/2019  . Hx of basal cell carcinoma 07/10/2017  . Rosacea   . Gait disturbance, post-stroke 10/05/2015  . Presbycusis of both ears   . Advanced care planning/counseling discussion 06/23/2015  . Impaired mobility 04/23/2015  . Hearing loss of right ear due to cerumen impaction 03/23/2015  . HTN (hypertension) 03/18/2015  . Diastolic dysfunction 93/23/5573  . Spastic hemiparesis affecting dominant side ([place]) 07/07/2014  . Shoulder joint pain 03/16/2014  . Chest pressure 02/03/2014  . Medicare annual wellness visit, subsequent 11/06/2013  . Erectile dysfunction due to arterial insufficiency 02/05/2013  . MDD (major depressive disorder), recurrent episode, moderate ([place])   . Palpitations 06/26/2012  . Hyperlipidemia associated with type 2 diabetes mellitus ([place])   . Restless leg syndrome   . GERD (gastroesophageal reflux disease)   . BPH with obstruction/lower urinary tract symptoms   . History of CVA (cerebrovascular accident) 03/11/2012  . Hemiparesis affecting right side as late effect of stroke ([place]) 03/11/2012  . Type 2 diabetes mellitus, uncontrolled, with neuropathy ([place]) 03/11/2012  . OSA on CPAP 03/11/2012  . CKD stage 3 secondary to diabetes ([place]) 03/11/2012  . Migraines 03/11/2012  . Thrombocytopenia ([place]) 2011   PCP:  [name], MD Pharmacy:   [place], [place] [address] [place] [address] Phone: [phone] Fax: [phone]  [id number] - [place], [name] - [place] AT [place] [address] [name] [address] Phone: [phone] Fax: [phone]     Social Determinants of Health ([place]) Interventions    Readmission Risk Interventions No flowsheet data found.

## 2020-11-05 NOTE — Consult Note (Signed)
   [facility] CM Inpatient Consult   11/05/2020  [name], MD 06/20/1931 [id number]   [place] Organization [ACO] Patient: [employer] Medicare  Primary Care Provider: [place] with Dr. [name] as primary.  Patient was screened for [place] Management services. Patient will have the transition of care call conducted by the primary care provider. This patient has had [facility] Chronic Care Management out reaches with the CCM team.  Plan: Will send [place] Management team any pertinent updates for TOC.    Please contact for further questions,  Natividad Brood, RN BSN [place] [facility] Liaison  Toll free office [phone]  Fax number: [phone] [email or web address]

## 2020-11-05 NOTE — Care Management Important Message (Signed)
Important Message  Patient Details  Name: James BECKNER, MD MRN: [id number] Date of Birth: 09-18-1931   Medicare Important Message Given:  Yes     Shelda Altes 11/05/2020, 9:27 AM

## 2020-11-05 NOTE — Progress Notes (Signed)
Patient voided well last night he had a total 1536ml ouput and 1 occurrence. His post void residual was 128ml.

## 2020-11-05 NOTE — Discharge Instructions (Signed)
Acute Kidney Injury, Adult  Acute kidney injury is a sudden worsening of kidney function. The kidneys are organs that have several jobs. They filter the blood to remove waste products and extra fluid. They also maintain a healthy balance of minerals and hormones in the body, which helps control blood pressure and keep bones strong. With this condition, your kidneys do not do their jobs as well as they should. This condition ranges from mild to severe. Over time, it may develop into long-lasting (chronic) kidney disease. Early detection and treatment may prevent acute kidney injury from developing into a chronic condition. What are the causes? Common causes of this condition include:  A problem with blood flow to the kidneys. This may be caused by: ? Low blood pressure (hypotension) or shock. ? Blood loss. ? Heart and blood vessel (cardiovascular) disease. ? Severe burns. ? Liver disease.  Direct damage to the kidneys. This may be caused by: ? Certain medicines. ? A kidney infection. ? Poisoning. ? Being around or in contact with toxic substances. ? A surgical wound. ? A hard, direct hit to the kidney area.  A sudden blockage of urine flow. This may be caused by: ? Cancer. ? Kidney stones. ? An enlarged prostate in males. What increases the risk? You are more likely to develop this condition if you:  Are older than age 65.  Are male.  Are hospitalized, especially if you are in critical condition.  Have certain conditions, such as: ? Chronic kidney disease. ? Diabetes. ? Coronary artery disease and heart failure. ? Pulmonary disease. ? Chronic liver disease. What are the signs or symptoms? Symptoms of this condition may not be obvious until the condition becomes severe. Symptoms of this condition can include:  Tiredness (lethargy) or difficulty staying awake.  Nausea or vomiting.  Swelling (edema) of the face, legs, ankles, or feet.  Problems with urination, such  as: ? Pain in the abdomen, or pain along the side of your stomach (flank). ? Producing little or no urine. ? Passing urine with a weak flow.  Muscle twitches and cramps, especially in the legs.  Confusion or trouble concentrating.  Loss of appetite.  Fever. How is this diagnosed? Your health care provider can diagnose this condition based on your symptoms, medical history, and a physical exam.  You may also have other tests, such as:  Blood tests.  Urine tests.  Imaging tests.  A test in which a sample of tissue is removed from the kidneys to be examined under a microscope (kidney biopsy). How is this treated? Treatment for this condition depends on the cause and how severe the condition is. In mild cases, treatment may not be needed. The kidneys may heal on their own. In more severe cases, treatment will involve:  Treating the cause of the kidney injury. This may involve changing any medicines you are taking or adjusting your dosage.  Fluids. You may need specialized IV fluids to balance your body's needs.  Having a catheter placed to drain urine and prevent blockages.  Preventing problems from occurring. This may mean avoiding certain medicines or procedures that can cause further injury to the kidneys. In some cases, treatment may also require:  A procedure to remove toxic wastes from the body (dialysis or continuous renal replacement therapy, CRRT).  Surgery. This may be done to repair a torn kidney or to remove the blockage from the urinary system. Follow these instructions at home: Medicines  Take over-the-counter and prescription medicines only as   told by your health care provider.  Do not take any new medicines without your health care provider's approval. Many medicines can worsen your kidney damage.  Do not take any vitamin and mineral supplements without your health care provider's approval. Many nutritional supplements can worsen your kidney  damage. Lifestyle  If your health care provider prescribed changes to your diet, follow them. You may need to decrease the amount of protein you eat.  Achieve and maintain a healthy weight. If you need help with this, ask your health care provider.  Start or continue an exercise plan. Try to exercise at least 30 minutes a day, 5 days a week.  Do not use any products that contain nicotine or tobacco, such as cigarettes, e-cigarettes, and chewing tobacco. If you need help quitting, ask your health care provider.   General instructions  Keep track of your blood pressure. Report changes in your blood pressure as told by your health care provider.  Stay up to date with your vaccines. Ask your health care provider which vaccines you need.  Keep all follow-up visits as told by your health care provider. This is important.   Where to find more information  American Association of Kidney Patients: BombTimer.gl  National Kidney Foundation: www.kidney.[place]: https://mathis.com/  [facility]: ? www.lifeoptions.org ? www.kidneyschool.org Contact a health care provider if:  Your symptoms get worse.  You develop new symptoms. Get help right away if:  You develop symptoms of worsening kidney disease, which include: ? Headaches. ? Abnormally dark or light skin. ? Easy bruising. ? Frequent hiccups. ? Chest pain. ? Shortness of breath. ? End of menstruation in women. ? Seizures. ? Confusion or altered mental status. ? Abdominal or back pain. ? Itchiness.  You have a fever.  Your body is producing less urine.  You have pain or bleeding when you urinate. Summary  Acute kidney injury is a sudden worsening of kidney function.  Acute kidney injury can be caused by problems with blood flow to the kidneys, direct damage to the kidneys, and sudden blockage of urine flow.  Symptoms of this condition may not be obvious until it becomes severe.  Symptoms may include edema, lethargy, confusion, nausea or vomiting, and problems passing urine.  This condition can be diagnosed with blood tests, urine tests, and imaging tests. Sometimes a kidney biopsy is done to diagnose this condition.  Treatment for this condition often involves treating the underlying cause. It is treated with fluids, medicines, diet changes, dialysis, or surgery. This information is not intended to replace advice given to you by your health care provider. Make sure you discuss any questions you have with your health care provider. Document Revised: 07/15/2019 Document Reviewed: 07/15/2019 Elsevier Patient Education  2021 [place] Prevention in the Home, Adult Falls can cause injuries and can happen to people of all ages. There are many things you can do to make your home safe and to help prevent falls. Ask for help when making these changes. What actions can I take to prevent falls? General Instructions  Use good lighting in all rooms. Replace any light bulbs that burn out.  Turn on the lights in dark areas. Use night-lights.  Keep items that you use often in easy-to-reach places. Lower the shelves around your home if needed.  Set up your furniture so you have a clear path. Avoid moving your furniture around.  Do not have throw rugs or other things on the  floor that can make you trip.  Avoid walking on wet floors.  If any of your floors are uneven, fix them.  Add color or contrast paint or tape to clearly mark and help you see: ? Grab bars or handrails. ? First and last steps of staircases. ? Where the edge of each step is.  If you use a stepladder: ? Make sure that it is fully opened. Do not climb a closed stepladder. ? Make sure the sides of the stepladder are locked in place. ? Ask someone to hold the stepladder while you use it.  Know where your pets are when moving through your home. What can I do in the bathroom?  Keep the floor  dry. Clean up any water on the floor right away.  Remove soap buildup in the tub or shower.  Use nonskid mats or decals on the floor of the tub or shower.  Attach bath mats securely with double-sided, nonslip rug tape.  If you need to sit down in the shower, use a plastic, nonslip stool.  Install grab bars by the toilet and in the tub and shower. Do not use towel bars as grab bars.      What can I do in the bedroom?  Make sure that you have a light by your bed that is easy to reach.  Do not use any sheets or blankets for your bed that hang to the floor.  Have a firm chair with side arms that you can use for support when you get dressed. What can I do in the kitchen?  Clean up any spills right away.  If you need to reach something above you, use a step stool with a grab bar.  Keep electrical cords out of the way.  Do not use floor polish or wax that makes floors slippery. What can I do with my stairs?  Do not leave any items on the stairs.  Make sure that you have a light switch at the top and the bottom of the stairs.  Make sure that there are handrails on both sides of the stairs. Fix handrails that are broken or loose.  Install nonslip stair treads on all your stairs.  Avoid having throw rugs at the top or bottom of the stairs.  Choose a carpet that does not hide the edge of the steps on the stairs.  Check carpeting to make sure that it is firmly attached to the stairs. Fix carpet that is loose or worn. What can I do on the outside of my home?  Use bright outdoor lighting.  Fix the edges of walkways and driveways and fix any cracks.  Remove anything that might make you trip as you walk through a door, such as a raised step or threshold.  Trim any bushes or trees on paths to your home.  Check to see if handrails are loose or broken and that both sides of all steps have handrails.  Install guardrails along the edges of any raised decks and porches.  Clear  paths of anything that can make you trip, such as tools or rocks.  Have leaves, snow, or ice cleared regularly.  Use sand or salt on paths during winter.  Clean up any spills in your garage right away. This includes grease or oil spills. What other actions can I take?  Wear shoes that: ? Have a low heel. Do not wear high heels. ? Have rubber bottoms. ? Feel good on your feet and fit well. ?  Are closed at the toe. Do not wear open-toe sandals.  Use tools that help you move around if needed. These include: ? Canes. ? Walkers. ? Scooters. ? Crutches.  Review your medicines with your doctor. Some medicines can make you feel dizzy. This can increase your chance of falling. Ask your doctor what else you can do to help prevent falls. Where to find more information  Centers for Disease Control and Prevention, [employer]: http://www.wolf.info/  National Institute on Aging: http://kim-miller.com/ Contact a doctor if:  You are afraid of falling at home.  You feel weak, drowsy, or dizzy at home.  You fall at home. Summary  There are many simple things that you can do to make your home safe and to help prevent falls.  Ways to make your home safe include removing things that can make you trip and installing grab bars in the bathroom.  Ask for help when making these changes in your home. This information is not intended to replace advice given to you by your health care provider. Make sure you discuss any questions you have with your health care provider. Document Revised: 04/07/2020 Document Reviewed: 04/07/2020 Elsevier Patient Education  [place].

## 2020-11-05 NOTE — Telephone Encounter (Signed)
Transition Care Management Unsuccessful Follow-up Telephone Call  Date of discharge and from where:  11/05/2020, [facility]  Attempts:  1st Attempt  Reason for unsuccessful TCM follow-up call:  Left voice message

## 2020-11-05 NOTE — Discharge Summary (Signed)
Discharge Summary  James Spurr, James Bell EYC:[id number] DOB: [birth year]  PCP: [name], James Bell  Admit date: 11/02/2020 Discharge date: 11/05/2020  Time spent: 12mins, more than 50% time spent on coordination of care.   Recommendations for Outpatient Follow-up:  1. F/u with PCP within a week  for hospital discharge follow up, repeat cbc/bmp at follow up, pcp to monitor renal function, renal dosing neurontin accordingly  2.  f/u with neurology 3. Ambulatory referral to palliative care ( patient agrees) 4. Patient is advised to check blood pressure twice at home and bring in blood pressure record for pcp to review 5. Advise patient do not drive until released by pcp and neurology  Discharge Diagnoses:  Active Hospital Problems   Diagnosis Date Noted  . Acute encephalopathy 11/02/2020  . HTN (hypertension) 03/18/2015  . MDD (major depressive disorder), recurrent episode, moderate ([place])   . GERD (gastroesophageal reflux disease)   . CKD stage 3 secondary to diabetes [facility]) 03/11/2012    Resolved Hospital Problems  No resolved problems to display.    Discharge Condition: stable  Diet recommendation: heart healthy/carb modified, encourage fluids intake to prevent dehydration   Filed Weights   11/03/20 0535 11/04/20 0508 11/05/20 0403  Weight: 82.3 kg 83 kg 84.2 kg    History of present illness: ( per admitting James Bell Dr Marlowe Sax) Chief Complaint: Diplopia, syncope  HPI: James Spurr, James Bell is a 87 y.o. male with medical history significant of CKD stage III, CVA with residual right hemiparesis, depression, GERD, BPH, hypertension, hyperlipidemia, OSA on CPAP, insulin-dependent type 2 diabetes, RLS presented to the ED via EMS for evaluation of diplopia.  Patient was recently treated for UTI.  Family reported slurred speech which began this morning.  Patient presenting with multiple days of generalized fatigue, minimal confusion, and fell asleep today while he was on the toilet,  fell asleep and hit his head but unclear if it was a syncopal event.  In the ED, vital signs stable.  Labs showing WBC 9.5, hemoglobin 11.9, hematocrit 36.9, platelet count 173K.  Hemoglobin 12.6 on repeat labs.  Sodium 134, potassium 4.9, chloride 105, bicarb 22, BUN 29, creatinine 1.7, glucose 194.  Blood ethanol level undetectable.  INR 1.1.  Lactic acid 1.0.  UA not suggestive of infection.  SARS-CoV-2 PCR test pending.  Chest x-ray showing no active disease.  Head CT negative for acute intracranial abnormality.  Brain MRI (motion degraded exam) showing no acute infarct.  Patient's mental status was initially normal in the ED and he was able to answer questions.  After he was given his home gabapentin and Ativan prior to MRI, he became more confused and restless.  CBG 61 and he was given juice.  VBG with pH 7.40, PCO2 33.  Patient is AAO x3 but confused and very restless.  Reports having double vision.  History provided mostly by daughter at bedside who states that patient started having double vision today but he has had this problem in the past as well.  States he was not confused when she spoke to him on Sunday but today when she spoke to the patient his speech was slurred.  Daughter states patient told her that for the past 2 days he has had multiple episodes of falling asleep while sitting.  He has not been eating much for the past few days.  Hospital Course:  Principal Problem:   Acute encephalopathy Active Problems:   CKD stage 3 secondary to diabetes (HCC)   GERD (gastroesophageal reflux  disease)   MDD (major depressive disorder), recurrent episode, moderate (HCC)   HTN (hypertension)   Acute toxic metabolic encephalopathy /diplopia Leslee Home weakness/reports falling asleep while on the commode and fell,  CT head no acute finding UA no infection Suspect from polypharmacy and dehydration /aki Reports recently started on oxycodone and cutting down neurontin for restless leg He  received reduce Neurontin dose, continue Mirapex, hold oxycodone carotid ultrasound, echocardiogram, EEG no acute findings He has improved, he walked with PT, PT recommend home health, patient desires to go home, home health order placed  Orthostatic vital sign Systolic blood pressure dropped from 120-108 upon standing, heart rate increased from 62-72 upon standing Resolved with  hydration Encourage oral intake   AKI on CKD 3B -ua on presentation no bacteria -With mild azotemia/creatinine mildly elevated above baseline ( baseline creatinine appears as from 1.4-1.6 -cr peaked at 1.98, ck unremarkable, renal US "Bilateral simple renal cysts. No hydronephrosis or renal obstruction is noted" -cr improved with hydration,  Renal dosing meds, encourage oral intake, f/u with pcp to repeat bmp  H/o BPH s/p TURP  reports recently having urinary difficulty, foley placed in the ED Successful  voiding trial,   PVR 142 cc  Start flomax Increase activity, avoid constipation   Insulin dependent type 2 diabetes uncontrolled with hypoglycemia A1c 8 A.m. blood glucose 64 on 2/16 Hold Tresiba, hold ozempic  Continue SSI, continue hypoglycemia protocol, encourage oral intake Am blood glucose improved,  Patient states he eats better at home , he does not like the hospital food, he reports use continuous glucose monitoring device , knows what to do for insulin adjustment , reports home blood glucose range from 70-200 Will resume home diabetic regimen, follow up with pcp closely   Reports chronic intermittent n/v, on zofran prn Ab exam benigh ozempic side effect? F/u with pcp  History of CVA with right-sided hemiparesis, continue Plavix, statin  HTN, blood pressure low normal with aki Blood pressure improving, aki improved Discharge home on home dose lopressor,  continue hold arb for another three days, start next week on reduced dose, check blood pressure at home, f/u with pcp for further  bp meds adjustment    Restless leg syndrome:  Followed by neurology at [facility], also has an follow up appointment at [place] [facility] this month Continue Mirapex,  Neurontin dose reduced to 100mg  bid in the hospital due to aki( renally dosing)  oxycodone held in the hospital due to drowsiness  He reports symptoms are fairly tolerated Drowsiness and renal function improved at discharge, he is discharged on home dose neurontin, he is advised to follow up with pcp to repeat bmp, neurontin dose needs to be adjusted according renal function Oxycodone changed from twice a day prn to qhs prn, he should not drive if he take this meds   Hypothyroidism TSH 2.7 Continue Synthroid  OSA on cpap   FTT PT eval recommended home health Patient agreed to outpatient palliative care  Referral        DVT prophylaxis while in the hospital: heparin injection 5,000 Units Start: 11/02/20 2200   Code Status: Full Family Communication: daughter over the phone Disposition:  home with home health  Consultants:   None  Procedures:   None  Antimicrobials:   None   Discharge Exam: BP 130/65   Pulse 65   Temp 98 F (36.7 C) (Oral)   Resp 16   Ht 5\' 8"  (1.727 m)   Wt 84.2 kg   SpO2 97%  BMI 28.22 kg/m   General: NAD, AAOX3 Cardiovascular: RRR Respiratory: CTABL  Discharge Instructions You were cared for by a hospitalist during your hospital stay. If you have any questions about your discharge medications or the care you received while you were in the hospital after you are discharged, you can call the unit and asked to speak with the hospitalist on call if the hospitalist that took care of you is not available. Once you are discharged, your primary care physician will handle any further medical issues. Please note that NO REFILLS for any discharge medications will be authorized once you are discharged, as it is imperative that you return to your primary  care physician (or establish a relationship with a primary care physician if you do not have one) for your aftercare needs so that they can reassess your need for medications and monitor your lab values.  Discharge Instructions    Amb Referral to Palliative Care   Complete by: As directed    Diet - low sodium heart healthy   Complete by: As directed    Carb modified diet   Increase activity slowly   Complete by: As directed      Allergies as of 11/05/2020      Reactions   Crestor [rosuvastatin]    Other reaction(s): muscle pain 2.16.2022 Pt is current on this medication without any issues.   Dulaglutide    Other reaction(s): upset stomach   Empagliflozin    Other reaction(s): yeast infection   Gabapentin Other (See Comments)   At high doses - BLURRED VISION, BALLANCE, CONCENTRATION PROBLEMS, RESTLESSNESS, NOT SLEEPING, ? LOST OF STRENGTH, UNSTEADINESS   Lisinopril Cough   tachycardia   Nexium [esomeprazole] Other (See Comments)   Headache   Niacin And Related Other (See Comments)   flushing   Pregabalin    Other reaction(s): MALAISE, SLIGHT NAUSEA, SLIGHT HEADACHE      Medication List    STOP taking these medications   cephALEXin 500 MG capsule Commonly known as: KEFLEX   sulfamethoxazole-trimethoprim 800-160 MG tablet Commonly known as: BACTRIM DS   traZODone 50 MG tablet Commonly known as: DESYREL     TAKE these medications   Alpha-Lipoic Acid 600 MG Caps Take 1 capsule (600 mg total) by mouth daily.   BD Pen Needle Nano 2nd Gen 32G X 4 MM Misc Generic drug: Insulin Pen Needle 1 each by Other route daily.   celecoxib 200 MG capsule Commonly known as: CeleBREX Take 1 capsule (200 mg total) by mouth daily. What changed:   when to take this  reasons to take this   clopidogrel 75 MG tablet Commonly known as: PLAVIX Take 1 tablet (75 mg total) by mouth daily.   Combigan 0.2-0.5 % ophthalmic solution Generic drug: brimonidine-timolol Place 1 drop into  the right eye in the morning and at bedtime.   COQ10 150 MG Caps Take 1 capsule by mouth 2 (two) times daily.   diclofenac Sodium 1 % Gel Commonly known as: VOLTAREN Apply 2 g topically 3 (three) times daily as needed. What changed: reasons to take this   DULoxetine 60 MG capsule Commonly known as: CYMBALTA TAKE 1 CAPSULE DAILY   famotidine 20 MG tablet Commonly known as: Pepcid Take 1 tablet (20 mg total) by mouth at bedtime. What changed: when to take this   Fish Oil 1000 MG Caps Take 1 capsule (1,000 mg total) by mouth in the morning and at bedtime.   gabapentin 300 MG capsule Commonly known  as: NEURONTIN Take 2 capsules (600 mg total) by mouth 2 (two) times daily.   insulin lispro 100 UNIT/ML KwikPen Commonly known as: HUMALOG Inject 4 Units into the skin daily at 12 noon. With largest meal   l-methylfolate-B6-B12 3-35-2 MG Tabs tablet Commonly known as: METANX Take 1 tablet by mouth daily.   levothyroxine 50 MCG tablet Commonly known as: SYNTHROID Take 1 tablet (50 mcg total) by mouth daily before breakfast. Per endo [name]   Lumigan 0.01 % Soln Generic drug: bimatoprost Place 1 drop into the right eye at bedtime.   melatonin 5 MG Tabs Take 5 mg by mouth at bedtime as needed for sleep.   metoprolol tartrate 25 MG tablet Commonly known as: LOPRESSOR TAKE ONE-HALF (1/2) TABLET (12.5 MG) AT BEDTIME WITH MORNING DOSE IF NEEDED What changed: See the new instructions.   nitroGLYCERIN 0.4 MG SL tablet Commonly known as: Nitrostat DISSOLVE 1 TABLET UNDER THE TONGUE EVERY 5 MINUTES AS NEEDED FOR CHEST PAIN What changed:   how much to take  how to take this  when to take this  reasons to take this  additional instructions   ondansetron 4 MG tablet Commonly known as: Zofran Take 1 tablet (4 mg total) by mouth every 8 (eight) hours as needed for nausea or vomiting.   oxyCODONE 5 MG immediate release tablet Commonly known as: Oxy IR/ROXICODONE Take 1  tablet (5 mg total) by mouth at bedtime as needed for breakthrough pain. rls What changed:   when to take this  reasons to take this   Ozempic (1 MG/DOSE) 2 MG/1.5ML Sopn Generic drug: Semaglutide (1 MG/DOSE) Inject 1 mg into the skin every Saturday.   pramipexole 1 MG tablet Commonly known as: MIRAPEX Take 1 mg by mouth See admin instructions. At 4pm   RABEprazole 20 MG tablet Commonly known as: ACIPHEX TAKE 1 TABLET DAILY   rosuvastatin 40 MG tablet Commonly known as: Crestor Take 1 tablet (40 mg total) by mouth daily.   senna-docusate 8.6-50 MG tablet Commonly known as: Senokot-S Take 1 tablet by mouth at bedtime.   tamsulosin 0.4 MG Caps capsule Commonly known as: FLOMAX Take 1 capsule (0.4 mg total) by mouth daily after breakfast.   Tyler Aas FlexTouch 200 UNIT/ML FlexTouch Pen Generic drug: insulin degludec Inject 90 Units into the skin at bedtime. As needed   valsartan 40 MG tablet Commonly known as: DIOVAN Take 0.5 tablets (20 mg total) by mouth daily. Start taking on: November 08, 2020 What changed:   how much to take  These instructions start on November 08, 2020. If you are unsure what to do until then, ask your doctor or other care provider.   vitamin C 500 MG tablet Commonly known as: ASCORBIC ACID Take 1,000 mg by mouth daily.      Allergies  Allergen Reactions  . Crestor [Rosuvastatin]     Other reaction(s): muscle pain 2.16.2022 Pt is current on this medication without any issues.  . Dulaglutide     Other reaction(s): upset stomach  . Empagliflozin     Other reaction(s): yeast infection  . Gabapentin Other (See Comments)    At high doses - BLURRED VISION, BALLANCE, CONCENTRATION PROBLEMS, RESTLESSNESS, NOT SLEEPING, ? LOST OF STRENGTH, UNSTEADINESS  . Lisinopril Cough    tachycardia  . Nexium [Esomeprazole] Other (See Comments)    Headache  . Niacin And Related Other (See Comments)    flushing  . Pregabalin     Other reaction(s):  MALAISE, SLIGHT NAUSEA,  SLIGHT HEADACHE    Follow-up Information    [name], James Bell Follow up in 1 week(s).   Specialty: Family Medicine Why: hospital discharge follow up, pcp to monitor renal function and neurontin dose adjustment. please chech your blood pressure twice a day, bring record for your pcp to review Contact information: [place] [phone]                The results of significant diagnostics from this hospitalization (including imaging, microbiology, ancillary and laboratory) are listed below for reference.    Significant Diagnostic Studies: CT Head Wo Contrast  Result Date: 11/02/2020 CLINICAL DATA:  87 year old male with history of minor head trauma. Slurred speech. EXAM: CT HEAD WITHOUT CONTRAST TECHNIQUE: Contiguous axial images were obtained from the base of the skull through the vertex without intravenous contrast. COMPARISON:  Head CT 07/01/2020. FINDINGS: Brain: Mild cerebral atrophy. Patchy and confluent areas of decreased attenuation are noted throughout the deep and periventricular white matter of the cerebral hemispheres bilaterally, compatible with chronic microvascular ischemic disease. Large old lacunar infarct in the left corona radiata and lentiform nucleus. No evidence of acute infarction, hemorrhage, hydrocephalus, extra-axial collection or mass lesion/mass effect. Vascular: No hyperdense vessel. Atherosclerotic calcifications in the vasculature at the skull base. Skull: Normal. Negative for fracture or focal lesion. Sinuses/Orbits: No acute finding. Other: None. IMPRESSION: 1. No acute intracranial abnormalities. 2. Mild cerebral atrophy with chronic microvascular ischemic changes and old left-sided lacunar infarct, similar to the prior study, as above. Electronically Signed   By: Vinnie Langton M.D.   On: 11/02/2020 13:19   MR BRAIN WO CONTRAST  Result Date: 11/02/2020 CLINICAL DATA:  Weakness and diplopia.  EXAM: MRI HEAD WITHOUT CONTRAST TECHNIQUE: Multiplanar, multiecho pulse sequences of the brain and surrounding structures were obtained without intravenous contrast. COMPARISON:  Head CT 11/02/2020 and MRI 10/26/2018 FINDINGS: The patient was unable to tolerate the complete examination. Axial and coronal diffusion, axial FLAIR, axial T2, and sagittal T1 sequences were obtained and are moderately motion degraded. Brain: There is no evidence of an acute infarct, intracranial hemorrhage, mass, midline shift, or extra-axial fluid collection within limitations of motion artifact. A chronic infarct is again noted in the left basal ganglia and deep white matter with associated ex vacuo dilatation of the left lateral ventricle. There is mild cerebral atrophy. Vascular: Major intracranial vascular flow voids are preserved. Skull and upper cervical spine: No gross marrow lesion. Sinuses/Orbits: Bilateral cataract extraction. Paranasal sinuses and mastoid air cells are clear. Other: None. IMPRESSION: 1. Motion degraded, incomplete examination. 2. No acute infarct. 3. Chronic left basal ganglia infarct. Electronically Signed   By: Logan Bores M.D.   On: 11/02/2020 18:54   US RENAL  Result Date: 11/04/2020 CLINICAL DATA:  Elevated creatinine level. EXAM: RENAL / URINARY TRACT ULTRASOUND COMPLETE COMPARISON:  February 20, 2014. FINDINGS: Right Kidney: Renal measurements: 11.1 x 6.2 x 5.0 cm = volume: 180 mL. Partially exophytic 2.4 cm simple cyst is seen arising from midpole. Echogenicity within normal limits. No mass or hydronephrosis visualized. Left Kidney: Renal measurements: 11.6 x 6.0 by 4.1 cm = volume: 149 mL. 4.4 cm bilobed cyst is noted in upper pole. Echogenicity within normal limits. No mass or hydronephrosis visualized. Bladder: Decompressed secondary to Foley catheter. Other: None. IMPRESSION: Bilateral simple renal cysts. No hydronephrosis or renal obstruction is noted. Electronically Signed   By: Marijo Conception  M.D.   On: 11/04/2020 15:59   DG [facility]  1 View  Result Date: 11/02/2020 CLINICAL DATA:  Loss of consciousness. EXAM: PORTABLE CHEST 1 VIEW COMPARISON:  07/02/2020 FINDINGS: 1231 hours. The lungs are clear without focal pneumonia, edema, pneumothorax or pleural effusion. Interstitial markings are diffusely coarsened with chronic features. Cardiopericardial silhouette is at upper limits of normal for size. Bones are diffusely demineralized. Telemetry leads overlie the chest. IMPRESSION: No active disease. Electronically Signed   By: Misty Stanley M.D.   On: 11/02/2020 12:55   EEG adult  Result Date: 11/03/2020 Lora Havens, James Bell     11/03/2020  1:13 PM Patient Name: James Spurr, James Bell MRN: [id number] Epilepsy Attending: Lora Havens Referring Physician/Provider: Dr. Shela Leff Date:  11/03/2020 Duration: 22.58 mins Patient history: 87 year old male with altered mental status.  EEG to evaluate for seizures. Level of alertness: Awake AEDs during EEG study: Gabapentin Technical aspects: This EEG study was done with scalp electrodes positioned according to the 10-20 International system of electrode placement. Electrical activity was acquired at a sampling rate of 500Hz  and reviewed with a high frequency filter of 70Hz  and a low frequency filter of 1Hz . EEG data were recorded continuously and digitally stored. Description: The posterior dominant rhythm consists of 8-9 Hz activity of moderate voltage (25-35 uV) seen predominantly in posterior head regions, symmetric and reactive to eye opening and eye closing. Hyperventilation and photic stimulation were not performed.   IMPRESSION: This study is within normal limits. No seizures or epileptiform discharges were seen throughout the recording. Lora Havens   ECHOCARDIOGRAM COMPLETE  Result Date: 11/04/2020    ECHOCARDIOGRAM REPORT   Patient Name:   [facility] Date of Exam: 11/03/2020 Medical Rec #:  [id number]          Height:        68.0 in Accession #:    [id number]         Weight:       181.4 lb Date of Birth:  15-Nov-1930         BSA:          1.961 m Patient Age:    87 years           BP:           114/63 mmHg Patient Gender: M                  HR:           86 bpm. Exam Location:  Inpatient Procedure: 2D Echo, Color Doppler, Cardiac Doppler and Intracardiac            Opacification Agent Indications:    Syncope 780.2 / R55  History:        Patient has prior history of Echocardiogram examinations, most                 recent 03/19/2015. Stroke and TIA, Arrythmias:PAC, RBBB and                 Palpitations; Risk Factors:Dyslipidemia, Diabetes and Sleep                 Apnea. Chronic kidney disease. GERD.  Sonographer:    Darlina Sicilian RDCS Referring Phys: [id number] Harold  1. Left ventricular ejection fraction, by estimation, is 60 to 65%. The left ventricle has normal function. The left ventricle has no regional wall motion abnormalities. Left ventricular diastolic parameters are indeterminate.  2. Right ventricular systolic function was not well visualized. The right ventricular size  is not well visualized. Poor RV visualization but grossly normal function. Tricuspid regurgitation signal is inadequate for assessing PA pressure.  3. The mitral valve is normal in structure. No evidence of mitral valve regurgitation.  4. The aortic valve was not well visualized. Aortic valve regurgitation is not visualized. No aortic stenosis is present.  5. The inferior vena cava is dilated in size with >50% respiratory variability, suggesting right atrial pressure of 8 mmHg. FINDINGS  Left Ventricle: Left ventricular ejection fraction, by estimation, is 60 to 65%. The left ventricle has normal function. The left ventricle has no regional wall motion abnormalities. Definity contrast agent was given IV to delineate the left ventricular  endocardial borders. The left ventricular internal cavity size was normal in size. There is no left  ventricular hypertrophy. Left ventricular diastolic parameters are indeterminate. Right Ventricle: The right ventricular size is not well visualized. Right vetricular wall thickness was not well visualized. Right ventricular systolic function was not well visualized. Tricuspid regurgitation signal is inadequate for assessing PA pressure. Left Atrium: Left atrial size was not well visualized. Right Atrium: Right atrial size was not well visualized. Pericardium: There is no evidence of pericardial effusion. Mitral Valve: The mitral valve is normal in structure. No evidence of mitral valve regurgitation. Tricuspid Valve: The tricuspid valve is normal in structure. Tricuspid valve regurgitation is not demonstrated. Aortic Valve: The aortic valve was not well visualized. Aortic valve regurgitation is not visualized. No aortic stenosis is present. Pulmonic Valve: The pulmonic valve was not well visualized. Pulmonic valve regurgitation is not visualized. Aorta: The aortic root is normal in size and structure. Venous: The inferior vena cava is dilated in size with greater than 50% respiratory variability, suggesting right atrial pressure of 8 mmHg. IAS/Shunts: The interatrial septum was not well visualized.  LEFT VENTRICLE PLAX 2D LVOT diam:     2.10 cm  Diastology LV SV:         45       LV e' medial:    6.64 cm/s LV SV Index:   23       LV E/e' medial:  9.4 LVOT Area:     3.46 cm LV e' lateral:   6.53 cm/s                         LV E/e' lateral: 9.6  AORTIC VALVE LVOT Vmax:   88.70 cm/s LVOT Vmean:  57.700 cm/s LVOT VTI:    0.130 m  AORTA Ao Root diam: 3.30 cm MITRAL VALVE MV Area (PHT): 3.42 cm    SHUNTS MV Decel Time: 222 msec    Systemic VTI:  0.13 m MV E velocity: 62.60 cm/s  Systemic Diam: 2.10 cm MV A velocity: 77.10 cm/s MV E/A ratio:  0.81 Oswaldo Milian James Bell Electronically signed by Oswaldo Milian James Bell Signature Date/Time: 11/04/2020/12:09:20 AM    Final    VAS US CAROTID  Result Date:  11/03/2020 Carotid Arterial Duplex Study Indications:       Syncope, diplopia, slurred speech. Risk Factors:      Hypertension, hyperlipidemia, Diabetes, prior CVA. Comparison Study:  03-19-2015 Prior carotid duplex study showed 1-39% stenosis                    bilaterally. Performing Technologist: Darlin Coco RDMS  Examination Guidelines: A complete evaluation includes B-mode imaging, spectral Doppler, color Doppler, and power Doppler as needed of all accessible portions of each vessel. Bilateral testing is considered  an integral part of a complete examination. Limited examinations for reoccurring indications may be performed as noted.  Right Carotid Findings: +----------+--------+--------+--------+------------------+------------------+           PSV cm/sEDV cm/sStenosisPlaque DescriptionComments           +----------+--------+--------+--------+------------------+------------------+ CCA Prox  83      12                                                   +----------+--------+--------+--------+------------------+------------------+ CCA Distal103     14                                                   +----------+--------+--------+--------+------------------+------------------+ ICA Prox  76      21      1-39%                     intimal thickening +----------+--------+--------+--------+------------------+------------------+ ICA Distal127     31                                                   +----------+--------+--------+--------+------------------+------------------+ ECA       135                                                          +----------+--------+--------+--------+------------------+------------------+ +----------+--------+-------+----------------+-------------------+           PSV cm/sEDV cmsDescribe        Arm Pressure (mmHG) +----------+--------+-------+----------------+-------------------+ [id number]            Multiphasic, WNL                     +----------+--------+-------+----------------+-------------------+ +---------+--------+--+--------+--+---------+ VertebralPSV cm/s77EDV cm/s10Antegrade +---------+--------+--+--------+--+---------+  Left Carotid Findings: +----------+--------+--------+--------+-----------------------+--------+           PSV cm/sEDV cm/sStenosisPlaque Description     Comments +----------+--------+--------+--------+-----------------------+--------+ CCA Prox  137     10                                              +----------+--------+--------+--------+-----------------------+--------+ CCA Distal103     14              heterogenous and smooth         +----------+--------+--------+--------+-----------------------+--------+ ICA Prox  147     18      1-39%   heterogenous and smooth         +----------+--------+--------+--------+-----------------------+--------+ ICA Distal100     25                                              +----------+--------+--------+--------+-----------------------+--------+ ECA       248                                                     +----------+--------+--------+--------+-----------------------+--------+ +----------+--------+--------+----------------+-------------------+  PSV cm/sEDV cm/sDescribe        Arm Pressure (mmHG) +----------+--------+--------+----------------+-------------------+ [id number]             Multiphasic, WNL                    +----------+--------+--------+----------------+-------------------+ +---------+--------+---+--------+--+---------+ VertebralPSV cm/s109EDV cm/s12Antegrade +---------+--------+---+--------+--+---------+   Summary: Right Carotid: Velocities in the right ICA are consistent with a 1-39% stenosis. Left Carotid: Velocities in the left ICA are consistent with a 1-39% stenosis. Vertebrals:  Bilateral vertebral arteries demonstrate antegrade flow. Subclavians: Normal flow hemodynamics were  seen in bilateral subclavian              arteries. *See table(s) above for measurements and observations.  Electronically signed by Antony Contras [facility] on 11/03/2020 at 12:38:21 PM.    Final     Microbiology: Recent Results (from the past 240 hour(s))  SARS CORONAVIRUS 2 (TAT 6-24 HRS) Nasopharyngeal Nasopharyngeal Swab     Status: None   Collection Time: 11/02/20  5:04 PM   Specimen: Nasopharyngeal Swab  Result Value Ref Range Status   SARS Coronavirus 2 NEGATIVE NEGATIVE Final    Comment: (NOTE) SARS-CoV-2 target nucleic acids are NOT DETECTED.  The SARS-CoV-2 RNA is generally detectable in upper and lower respiratory specimens during the acute phase of infection. Negative results do not preclude SARS-CoV-2 infection, do not rule out co-infections with other pathogens, and should not be used as the sole basis for treatment or other patient management decisions. Negative results must be combined with clinical observations, patient history, and epidemiological information. The expected result is Negative.  Fact Sheet for Patients: SugarRoll.be  Fact Sheet for Healthcare Providers: https://www.woods-mathews.com/  This test is not yet approved or cleared by the [place] FDA and  has been authorized for detection and/or diagnosis of SARS-CoV-2 by FDA under an Emergency Use Authorization (EUA). This EUA will remain  in effect (meaning this test can be used) for the duration of the COVID-19 declaration under Se ction 564(b)(1) of the Act, 21 U.S.C. section 360bbb-3(b)(1), unless the authorization is terminated or revoked sooner.  Performed at [facility], [place] [address], [place], [place] [address]      Labs: Basic Metabolic Panel: Recent Labs  Lab 11/02/20 1200 11/02/20 2005 11/03/20 0443 11/04/20 0148 11/05/20 0149  NA 134* 139 139 140 137  K 4.9 4.4 4.3 4.4 4.1  CL 105  --  109 109 110  CO2 22  --  21* 22 20*  GLUCOSE  194*  --  64* 148* 204*  BUN 29*  --  25* 24* 21  CREATININE 1.78*  --  1.74* 1.98* 1.57*  CALCIUM 8.1*  --  8.6* 8.4* 8.0*  MG  --   --   --  2.2  --    Liver Function Tests: Recent Labs  Lab 11/02/20 1200  AST 24  ALT 16  ALKPHOS 39  BILITOT 0.2*  PROT 6.4*  ALBUMIN 3.1*   No results for input(s): LIPASE, AMYLASE in the last 168 hours. No results for input(s): AMMONIA in the last 168 hours. CBC: Recent Labs  Lab 11/02/20 1200 11/02/20 2005 11/04/20 0148  WBC 9.5  --  8.3  HGB 11.9* 12.6* 11.7*  HCT 36.9* 37.0* 34.7*  MCV 93.4  --  90.8  PLT 173  --  166   Cardiac Enzymes: Recent Labs  Lab 11/04/20 0148  CKTOTAL 352   BNP: BNP (last 3 results) No results for input(s): BNP in the  last 8760 hours.  ProBNP (last 3 results) No results for input(s): PROBNP in the last 8760 hours.  CBG: Recent Labs  Lab 11/04/20 0812 11/04/20 1218 11/04/20 1701 11/04/20 2229 11/05/20 0849  GLUCAP 98 121* 208* 183* 145*       Signed:  Florencia Reasons MD, PhD, FACP  [facility] 11/05/2020, 10:10 AM

## 2020-11-08 NOTE — Telephone Encounter (Signed)
Transition Care Management Follow-up Telephone Call  Date of discharge and from where: 11/05/2020, [facility]  How have you been since you were released from the hospital? Patient states that he is doing fine.   Any questions or concerns? No  Items Reviewed:  Did the pt receive and understand the discharge instructions provided? Yes   Medications obtained and verified? Yes   Other? No   Any new allergies since your discharge? No   Dietary orders reviewed? Yes  Do you have support at home? Yes   Home Care and Equipment/Supplies: Were home health services ordered? not applicable If so, what is the name of the agency? N/A  Has the agency set up a time to come to the patient's home? not applicable Were any new equipment or medical supplies ordered?  No What is the name of the medical supply agency? N/A Were you able to get the supplies/equipment? not applicable Do you have any questions related to the use of the equipment or supplies? No  Functional Questionnaire: (I = Independent and D = Dependent) ADLs: I  Bathing/Dressing- I  Meal Prep- I  Eating- I  Maintaining continence- I  Transferring/Ambulation- I  Managing Meds- I  Follow up appointments reviewed:   PCP Hospital f/u appt confirmed? Yes  Scheduled to see Dr. Danise Mina on 11/10/2020 @ 12:30 pm.  [facility] f/u appt confirmed? No    Are transportation arrangements needed? No   If their condition worsens, is the pt aware to call PCP or go to the Emergency Dept.? Yes  Was the patient provided with contact information for the PCP's office or ED? Yes  Was to pt encouraged to call back with questions or concerns? Yes

## 2020-11-09 ENCOUNTER — Telehealth: Payer: Self-pay | Admitting: Family Medicine

## 2020-11-09 ENCOUNTER — Telehealth: Payer: Self-pay | Admitting: *Deleted

## 2020-11-09 DIAGNOSIS — N183 Chronic kidney disease, stage 3 unspecified: Secondary | ICD-10-CM | POA: Diagnosis not present

## 2020-11-09 DIAGNOSIS — G9340 Encephalopathy, unspecified: Secondary | ICD-10-CM | POA: Diagnosis not present

## 2020-11-09 DIAGNOSIS — E1122 Type 2 diabetes mellitus with diabetic chronic kidney disease: Secondary | ICD-10-CM | POA: Diagnosis not present

## 2020-11-09 DIAGNOSIS — Z794 Long term (current) use of insulin: Secondary | ICD-10-CM | POA: Diagnosis not present

## 2020-11-09 DIAGNOSIS — I69351 Hemiplegia and hemiparesis following cerebral infarction affecting right dominant side: Secondary | ICD-10-CM | POA: Diagnosis not present

## 2020-11-09 DIAGNOSIS — K219 Gastro-esophageal reflux disease without esophagitis: Secondary | ICD-10-CM | POA: Diagnosis not present

## 2020-11-09 DIAGNOSIS — Z7902 Long term (current) use of antithrombotics/antiplatelets: Secondary | ICD-10-CM | POA: Diagnosis not present

## 2020-11-09 DIAGNOSIS — I10 Essential (primary) hypertension: Secondary | ICD-10-CM | POA: Diagnosis not present

## 2020-11-09 NOTE — Telephone Encounter (Signed)
Agree with this. Thank you.  

## 2020-11-09 NOTE — Telephone Encounter (Signed)
Agree with this. thanks. 

## 2020-11-09 NOTE — Telephone Encounter (Signed)
Spoke with Education officer, museum with [facility] informing her Dr. Darnell Level is giving verbal orders for palliative care for pt. Says she will give message to [place].

## 2020-11-09 NOTE — Telephone Encounter (Signed)
Approval for physical therapy  2 week 4 1 week 4  EM

## 2020-11-09 NOTE — Telephone Encounter (Signed)
Langley Gauss with [facility] left a voicemail stating that patient is being discharged from the hospital. Langley Gauss stated that they have been requested to provide palliative care. Langley Gauss would like to know if Dr. Danise Mina is okay with them providing the patient with this service. Langley Gauss requested a call back.

## 2020-11-10 ENCOUNTER — Other Ambulatory Visit: Payer: Self-pay

## 2020-11-10 ENCOUNTER — Encounter: Payer: Self-pay | Admitting: Family Medicine

## 2020-11-10 ENCOUNTER — Telehealth: Payer: Self-pay | Admitting: Nurse Practitioner

## 2020-11-10 ENCOUNTER — Ambulatory Visit (INDEPENDENT_AMBULATORY_CARE_PROVIDER_SITE_OTHER): Payer: Medicare Other | Admitting: Family Medicine

## 2020-11-10 VITALS — BP 122/60 | HR 74 | Temp 98.3°F | Ht 68.0 in | Wt 189.4 lb

## 2020-11-10 DIAGNOSIS — I10 Essential (primary) hypertension: Secondary | ICD-10-CM

## 2020-11-10 DIAGNOSIS — I69351 Hemiplegia and hemiparesis following cerebral infarction affecting right dominant side: Secondary | ICD-10-CM | POA: Diagnosis not present

## 2020-11-10 DIAGNOSIS — N401 Enlarged prostate with lower urinary tract symptoms: Secondary | ICD-10-CM | POA: Diagnosis not present

## 2020-11-10 DIAGNOSIS — E1122 Type 2 diabetes mellitus with diabetic chronic kidney disease: Secondary | ICD-10-CM | POA: Diagnosis not present

## 2020-11-10 DIAGNOSIS — N183 Chronic kidney disease, stage 3 unspecified: Secondary | ICD-10-CM

## 2020-11-10 DIAGNOSIS — N138 Other obstructive and reflux uropathy: Secondary | ICD-10-CM | POA: Diagnosis not present

## 2020-11-10 DIAGNOSIS — G9340 Encephalopathy, unspecified: Secondary | ICD-10-CM

## 2020-11-10 DIAGNOSIS — G2581 Restless legs syndrome: Secondary | ICD-10-CM

## 2020-11-10 LAB — CBC WITH DIFFERENTIAL/PLATELET
Basophils Absolute: 0 10*3/uL (ref 0.0–0.1)
Basophils Relative: 0.5 % (ref 0.0–3.0)
Eosinophils Absolute: 0.2 10*3/uL (ref 0.0–0.7)
Eosinophils Relative: 2 % (ref 0.0–5.0)
HCT: 36.7 % — ABNORMAL LOW (ref 39.0–52.0)
Hemoglobin: 12.4 g/dL — ABNORMAL LOW (ref 13.0–17.0)
Lymphocytes Relative: 22.5 % (ref 12.0–46.0)
Lymphs Abs: 1.8 10*3/uL (ref 0.7–4.0)
MCHC: 33.9 g/dL (ref 30.0–36.0)
MCV: 91.1 fl (ref 78.0–100.0)
Monocytes Absolute: 0.8 10*3/uL (ref 0.1–1.0)
Monocytes Relative: 9.6 % (ref 3.0–12.0)
Neutro Abs: 5.2 10*3/uL (ref 1.4–7.7)
Neutrophils Relative %: 65.4 % (ref 43.0–77.0)
Platelets: 184 10*3/uL (ref 150.0–400.0)
RBC: 4.03 Mil/uL — ABNORMAL LOW (ref 4.22–5.81)
RDW: 12.9 % (ref 11.5–15.5)
WBC: 8 10*3/uL (ref 4.0–10.5)

## 2020-11-10 LAB — IBC PANEL
Iron: 90 ug/dL (ref 42–165)
Saturation Ratios: 29 % (ref 20.0–50.0)
Transferrin: 222 mg/dL (ref 212.0–360.0)

## 2020-11-10 LAB — RENAL FUNCTION PANEL
Albumin: 3.9 g/dL (ref 3.5–5.2)
BUN: 32 mg/dL — ABNORMAL HIGH (ref 6–23)
CO2: 28 mEq/L (ref 19–32)
Calcium: 8.9 mg/dL (ref 8.4–10.5)
Chloride: 103 mEq/L (ref 96–112)
Creatinine, Ser: 1.5 mg/dL (ref 0.40–1.50)
GFR: 41.64 mL/min — ABNORMAL LOW (ref >60.00)
Glucose, Bld: 143 mg/dL — ABNORMAL HIGH (ref 70–99)
Phosphorus: 4.2 mg/dL (ref 2.3–4.6)
Potassium: 4.4 mEq/L (ref 3.5–5.1)
Sodium: 137 mEq/L (ref 135–145)

## 2020-11-10 LAB — FERRITIN: Ferritin: 321.6 ng/mL (ref 22.0–322.0)

## 2020-11-10 MED ORDER — ONDANSETRON HCL 4 MG PO TABS: 4.0000 mg | ORAL_TABLET | Freq: Three times a day (TID) | ORAL | 0 refills | Status: DC | PRN

## 2020-11-10 NOTE — Assessment & Plan Note (Signed)
Flomax started in the hospital - he doesn't notice significant change in urination. Discussed PM dosing.

## 2020-11-10 NOTE — Assessment & Plan Note (Addendum)
AMS led to recent hospitalization - reassuring evaluation including head imaging, carotid US, echocardiogram and EEG. Likely dehydration which led to AKI + oxycodone use. He has decided to stop oxycodone, which has been added to intolerance list per his request. Will continue to monitor mentation.  [facility] involved.

## 2020-11-10 NOTE — Telephone Encounter (Signed)
Attempted to contact Erline Levine at [facility] # provided.  Received error msg stating, "Call cannot be completed as dialed."  Called [facility] and was given correct phn # [phone].  Spoke with Erline Levine informing her Dr. Darnell Level is giving verbal orders for PT series requested.

## 2020-11-10 NOTE — Telephone Encounter (Signed)
Spoke with patient's daughter, [name], regarding the Palliative referral and all questions were answered.  She requested that I call patient to schedule visit, daughter stated that patient is currently at MD appointment now.  Told daughter that I would call him back later this afternoon or in the morning.

## 2020-11-10 NOTE — Patient Instructions (Addendum)
Labs today  I agree with stopping oxycodone at this time.  Today's labs will determine recommendations for gabapentin and valsartan dosing.  Keep Monday's appointment with neurology.  Change flomax to night time dosing.

## 2020-11-10 NOTE — Progress Notes (Signed)
Patient ID: [facility] Spurr, [facility], male    DOB: [birth year], 87 y.o.   MRN: [id number]  This visit was conducted in person.  BP 122/60   Pulse 74   Temp 98.3 F (36.8 C) (Temporal)   Ht 5\' 8"  (1.727 m)   Wt 189 lb 6 oz (85.9 kg)   SpO2 98%   BMI 28.79 kg/m    CC: hosp f/u visit  Subjective:   HPI: James Spurr, [facility] is a 87 y.o. male presenting on 11/10/2020 for No chief complaint on file.   Recent hospitalization for altered mental status with diplopia in setting of recent treatment for UTI. Daughter noted slurred speech the morning of hospital admission associated with several days of fatigue, confusion, increased somnolence (fell asleep on toilet). Tested negative for COVID infection. Head CT then brain MRI overall reassuring, also received reassuring EEG, echocardiogram and carotid US. Symptoms thought stemming from dehydration + polypharmacy. Rehydrated for AKI with Cr peak at 1.98, dropped to 1.78 on discharge. He was started on flomax for BPH, initially treated with foley. Had successful voiding trial with PVR 142cc.   For mild hypotension - home ARB held.  RLS followed by [facility] neurology - gabapentin dose reduced from 600mg  tid to 100mg  bid (renally dosed), oxycodone held.   Since home, he's tried 1/2 oxycodone - this still caused confusion next am - has decided to stop.   Discharged with [facility] referral. Seeing [facility].  Referred for palliative care - pending. Discussed this.  He drove here today. No BP log today.    Admit date: 11/02/2020 Discharge date: 11/05/2020 TCM hosp f/u phone call completed on 11/08/2020  Recommendations for Outpatient Follow-up:  1. F/u with PCP within a week  for hospital discharge follow up, repeat cbc/bmp at follow up, pcp to monitor renal function, renal dosing neurontin accordingly  2.  f/u with neurology 3. Ambulatory referral to palliative care ( patient agrees) 4. Patient is advised to check blood pressure twice at home and  bring in blood pressure record for pcp to review 5. Advise patient do not drive until released by pcp and neurology  Discharge Diagnoses:      Active Hospital Problems   Diagnosis Date Noted  . Acute encephalopathy 11/02/2020  . HTN (hypertension) 03/18/2015  . MDD (major depressive disorder), recurrent episode, moderate ([place])   . GERD (gastroesophageal reflux disease)   . CKD stage 3 secondary to diabetes [facility]) 03/11/2012    Resolved Hospital Problems  No resolved problems to display.   Discharge Condition: stable  Diet recommendation: heart healthy/carb modified, encourage fluids intake to prevent dehydration       Relevant past medical, surgical, family and social history reviewed and updated as indicated. Interim medical history since our last visit reviewed. Allergies and medications reviewed and updated. Outpatient Medications Prior to Visit  Medication Sig Dispense Refill  . Alpha-Lipoic Acid 600 MG CAPS Take 1 capsule (600 mg total) by mouth daily. 30 capsule 11  . BD PEN NEEDLE NANO 2ND GEN 32G X 4 MM MISC 1 each by Other route daily. 100 each 0  . celecoxib (CELEBREX) 200 MG capsule Take 1 capsule (200 mg total) by mouth daily. (Patient taking differently: Take 200 mg by mouth daily as needed for mild pain.) 90 capsule 1  . clopidogrel (PLAVIX) 75 MG tablet Take 1 tablet (75 mg total) by mouth daily. 90 tablet 3  . Coenzyme Q10 (COQ10) 150 MG CAPS Take 1 capsule by  mouth 2 (two) times daily.    . COMBIGAN 0.2-0.5 % ophthalmic solution Place 1 drop into the right eye in the morning and at bedtime.    . diclofenac Sodium (VOLTAREN) 1 % GEL Apply 2 g topically 3 (three) times daily as needed. (Patient taking differently: Apply 2 g topically 3 (three) times daily as needed (pain).) 100 g 1  . DULoxetine (CYMBALTA) 60 MG capsule TAKE 1 CAPSULE DAILY (Patient taking differently: Take 60 mg by mouth daily.) 90 capsule 3  . famotidine (PEPCID) 20 MG tablet Take 1 tablet  (20 mg total) by mouth at bedtime. (Patient taking differently: Take 20 mg by mouth daily.)    . gabapentin (NEURONTIN) 300 MG capsule Take 600 mg by mouth 3 (three) times daily.    . insulin degludec (TRESIBA FLEXTOUCH) 200 UNIT/ML FlexTouch Pen Inject 90 Units into the skin at bedtime. As needed    . insulin lispro (HUMALOG) 100 UNIT/ML KwikPen Inject 4 Units into the skin daily at 12 noon. With largest meal    . l-methylfolate-B6-B12 (METANX) 3-35-2 MG TABS tablet Take 1 tablet by mouth daily. 30 tablet 0  . levothyroxine (SYNTHROID) 50 MCG tablet Take 1 tablet (50 mcg total) by mouth daily before breakfast. Per endo [name]    . LUMIGAN 0.01 % SOLN Place 1 drop into the right eye at bedtime.    . melatonin 5 MG TABS Take 5 mg by mouth at bedtime as needed for sleep.    . metoprolol tartrate (LOPRESSOR) 25 MG tablet TAKE ONE-HALF (1/2) TABLET (12.5 MG) AT BEDTIME WITH MORNING DOSE IF NEEDED (Patient taking differently: Take 25 mg by mouth at bedtime.) 90 tablet 3  . nitroGLYCERIN (NITROSTAT) 0.4 MG SL tablet DISSOLVE 1 TABLET UNDER THE TONGUE EVERY 5 MINUTES AS NEEDED FOR CHEST PAIN (Patient taking differently: Place 0.4 mg under the tongue every 5 (five) minutes as needed for chest pain.) 25 tablet 1  . Omega-3 Fatty Acids (FISH OIL) 1000 MG CAPS Take 1 capsule (1,000 mg total) by mouth in the morning and at bedtime.  0  . pramipexole (MIRAPEX) 1 MG tablet Take 1 mg by mouth See admin instructions. At 4pm    . RABEprazole (ACIPHEX) 20 MG tablet TAKE 1 TABLET DAILY (Patient taking differently: Take 20 mg by mouth daily.) 90 tablet 3  . rosuvastatin (CRESTOR) 40 MG tablet Take 1 tablet (40 mg total) by mouth daily. 90 tablet 3  . Semaglutide, 1 MG/DOSE, (OZEMPIC, 1 MG/DOSE,) 2 MG/1.5ML SOPN Inject 1 mg into the skin every Saturday.    . senna-docusate (SENOKOT-S) 8.6-50 MG tablet Take 1 tablet by mouth at bedtime. 30 tablet 0  . tamsulosin (FLOMAX) 0.4 MG CAPS capsule Take 1 capsule (0.4 mg total)  by mouth daily after breakfast. 30 capsule 0  . valsartan (DIOVAN) 40 MG tablet Take 0.5 tablets (20 mg total) by mouth daily.    . vitamin C (ASCORBIC ACID) 500 MG tablet Take 1,000 mg by mouth daily.     . ondansetron (ZOFRAN) 4 MG tablet Take 1 tablet (4 mg total) by mouth every 8 (eight) hours as needed for nausea or vomiting. 20 tablet 0  . oxyCODONE (OXY IR/ROXICODONE) 5 MG immediate release tablet Take 1 tablet (5 mg total) by mouth at bedtime as needed for breakthrough pain. rls 20 tablet 0   No facility-administered medications prior to visit.     Per HPI unless specifically indicated in ROS section below Review of Systems Objective:  BP 122/60   Pulse 74   Temp 98.3 F (36.8 C) (Temporal)   Ht 5\' 8"  (1.727 m)   Wt 189 lb 6 oz (85.9 kg)   SpO2 98%   BMI 28.79 kg/m   Wt Readings from Last 3 Encounters:  11/10/20 189 lb 6 oz (85.9 kg)  11/05/20 185 lb 10 oz (84.2 kg)  10/18/20 184 lb 1 oz (83.5 kg)      Physical Exam Vitals and nursing note reviewed.  Constitutional:      General: He is not in acute distress.    Appearance: Normal appearance. He is not ill-appearing.     Comments: Ambulates with rolling walker  Cardiovascular:     Rate and Rhythm: Normal rate and regular rhythm.     Pulses: Normal pulses.     Heart sounds: Normal heart sounds. No murmur heard.   Pulmonary:     Effort: Pulmonary effort is normal. No respiratory distress.     Breath sounds: Normal breath sounds. No wheezing, rhonchi or rales.  Musculoskeletal:        General: Normal range of motion.     Right lower leg: No edema.     Left lower leg: No edema.  Skin:    General: Skin is warm and dry.     Findings: No rash.  Neurological:     Mental Status: He is alert.  Psychiatric:        Mood and Affect: Mood normal.        Behavior: Behavior normal.       Lab Results  Component Value Date   CREATININE 1.57 (H) 11/05/2020   BUN 21 11/05/2020   NA 137 11/05/2020   K 4.1 11/05/2020    CL 110 11/05/2020   CO2 20 (L) 11/05/2020    Lab Results  Component Value Date   WBC 8.3 11/04/2020   HGB 11.7 (L) 11/04/2020   HCT 34.7 (L) 11/04/2020   MCV 90.8 11/04/2020   PLT 166 11/04/2020    Assessment & Plan:  This visit occurred during the SARS-CoV-2 public health emergency.  Safety protocols were in place, including screening questions prior to the visit, additional usage of staff PPE, and extensive cleaning of exam room while observing appropriate contact time as indicated for disinfecting solutions.   Problem List Items Addressed This Visit    Restless leg syndrome    Ongoing, predominant concern. Difficult situation given concern that opiate led to recent hospitalization with AMS.  Has decided to stop oxycodone. Continues mirapex 1mg  daily as well as gabapentin 600mg  TID - discussed may need to decrease dose based on kidney function.  [facility] Jeraldine Loots) f/u planned in a few months, but he is considering transitioning care locally to [place]. Will touch base with [facility] about this at upcoming appt.       HTN (hypertension)    ARB currently on hold, bp stable today - check renal panel.       Hemiparesis affecting right side as late effect of stroke [facility])    Has been referred to palliative care outpatient program.       CKD stage 3 secondary to diabetes [facility])    Recent hospitalization with AKI - update renal panel.  Valsartan currently on hold.  Today's kidney function will guide gabapentin and ARB dosing.       Relevant Orders   CBC with Differential/Platelet   Renal function panel   Ferritin   IBC panel   BPH with obstruction/lower  urinary tract symptoms    Flomax started in the hospital - he doesn't notice significant change in urination. Discussed PM dosing.       Acute encephalopathy - Primary    AMS led to recent hospitalization - reassuring evaluation including head imaging, carotid US, echocardiogram and EEG. Likely dehydration which led to AKI + oxycodone  use. He has decided to stop oxycodone, which has been added to intolerance list per his request. Will continue to monitor mentation.  [facility] involved.           Meds ordered this encounter  Medications  . ondansetron (ZOFRAN) 4 MG tablet    Sig: Take 1 tablet (4 mg total) by mouth every 8 (eight) hours as needed for nausea or vomiting.    Dispense:  30 tablet    Refill:  0   Orders Placed This Encounter  Procedures  . CBC with Differential/Platelet  . Renal function panel  . Ferritin  . IBC panel    Patient Instructions  Labs today  I agree with stopping oxycodone at this time.  Today's labs will determine recommendations for gabapentin and valsartan dosing.  Keep Monday's appointment with neurology.  Change flomax to night time dosing.   Follow up plan: Return if symptoms worsen or fail to improve.  [name], [facility]

## 2020-11-10 NOTE — Assessment & Plan Note (Addendum)
Recent hospitalization with AKI - update renal panel.  Valsartan currently on hold.  Today's kidney function will guide gabapentin and ARB dosing.

## 2020-11-10 NOTE — Assessment & Plan Note (Signed)
ARB currently on hold, bp stable today - check renal panel.

## 2020-11-10 NOTE — Assessment & Plan Note (Signed)
Has been referred to palliative care outpatient program.

## 2020-11-10 NOTE — Assessment & Plan Note (Signed)
Ongoing, predominant concern. Difficult situation given concern that opiate led to recent hospitalization with AMS.  Has decided to stop oxycodone. Continues mirapex 1mg  daily as well as gabapentin 600mg  TID - discussed may need to decrease dose based on kidney function.  [facility] neuro Jeraldine Loots) f/u planned in a few months, but he is considering transitioning care locally to [place]. Will touch base with [facility] about this at upcoming appt.

## 2020-11-11 ENCOUNTER — Telehealth: Payer: Self-pay | Admitting: Nurse Practitioner

## 2020-11-11 NOTE — Telephone Encounter (Signed)
Called patient's home & cell # to offer to schedule Palliative Consult, no answer - left message at both numbers requesting a return call to schedule visit.  Left my name and call back number.

## 2020-11-12 DIAGNOSIS — G9340 Encephalopathy, unspecified: Secondary | ICD-10-CM | POA: Diagnosis not present

## 2020-11-12 DIAGNOSIS — Z7902 Long term (current) use of antithrombotics/antiplatelets: Secondary | ICD-10-CM | POA: Diagnosis not present

## 2020-11-12 DIAGNOSIS — I69351 Hemiplegia and hemiparesis following cerebral infarction affecting right dominant side: Secondary | ICD-10-CM | POA: Diagnosis not present

## 2020-11-12 DIAGNOSIS — K219 Gastro-esophageal reflux disease without esophagitis: Secondary | ICD-10-CM | POA: Diagnosis not present

## 2020-11-12 DIAGNOSIS — E1122 Type 2 diabetes mellitus with diabetic chronic kidney disease: Secondary | ICD-10-CM | POA: Diagnosis not present

## 2020-11-12 DIAGNOSIS — Z794 Long term (current) use of insulin: Secondary | ICD-10-CM | POA: Diagnosis not present

## 2020-11-12 DIAGNOSIS — I10 Essential (primary) hypertension: Secondary | ICD-10-CM | POA: Diagnosis not present

## 2020-11-12 DIAGNOSIS — N183 Chronic kidney disease, stage 3 unspecified: Secondary | ICD-10-CM | POA: Diagnosis not present

## 2020-11-15 ENCOUNTER — Ambulatory Visit (INDEPENDENT_AMBULATORY_CARE_PROVIDER_SITE_OTHER): Payer: Medicare Other | Admitting: Adult Health

## 2020-11-15 ENCOUNTER — Other Ambulatory Visit: Payer: Self-pay | Admitting: Family Medicine

## 2020-11-15 ENCOUNTER — Encounter: Payer: Self-pay | Admitting: Adult Health

## 2020-11-15 VITALS — BP 127/72 | HR 66 | Ht 68.0 in | Wt 188.0 lb

## 2020-11-15 DIAGNOSIS — G2581 Restless legs syndrome: Secondary | ICD-10-CM | POA: Diagnosis not present

## 2020-11-15 DIAGNOSIS — Z9989 Dependence on other enabling machines and devices: Secondary | ICD-10-CM | POA: Diagnosis not present

## 2020-11-15 DIAGNOSIS — K219 Gastro-esophageal reflux disease without esophagitis: Secondary | ICD-10-CM | POA: Diagnosis not present

## 2020-11-15 DIAGNOSIS — G4733 Obstructive sleep apnea (adult) (pediatric): Secondary | ICD-10-CM | POA: Diagnosis not present

## 2020-11-15 DIAGNOSIS — E1122 Type 2 diabetes mellitus with diabetic chronic kidney disease: Secondary | ICD-10-CM | POA: Diagnosis not present

## 2020-11-15 DIAGNOSIS — Z794 Long term (current) use of insulin: Secondary | ICD-10-CM | POA: Diagnosis not present

## 2020-11-15 DIAGNOSIS — I69351 Hemiplegia and hemiparesis following cerebral infarction affecting right dominant side: Secondary | ICD-10-CM | POA: Diagnosis not present

## 2020-11-15 DIAGNOSIS — Z7902 Long term (current) use of antithrombotics/antiplatelets: Secondary | ICD-10-CM | POA: Diagnosis not present

## 2020-11-15 DIAGNOSIS — N183 Chronic kidney disease, stage 3 unspecified: Secondary | ICD-10-CM | POA: Diagnosis not present

## 2020-11-15 DIAGNOSIS — I10 Essential (primary) hypertension: Secondary | ICD-10-CM | POA: Diagnosis not present

## 2020-11-15 DIAGNOSIS — G9340 Encephalopathy, unspecified: Secondary | ICD-10-CM | POA: Diagnosis not present

## 2020-11-15 NOTE — Patient Instructions (Signed)
Continue using CPAP nightly and greater than 4 hours each night Will discuss RLS with Dr. Rexene Alberts If your symptoms worsen or you develop new symptoms please let us know.

## 2020-11-15 NOTE — Progress Notes (Addendum)
PATIENT: James Spurr, MD DOB: November 24, 1930  REASON FOR VISIT: follow up HISTORY FROM: patient  HISTORY OF PRESENT ILLNESS: Today 11/15/20: James Bell is an 87 year old male with a history of obstructive sleep apnea on CPAP.  He reports that the CPAP is working well for him.  He is more concerned about his restless legs.  He states that he has been seeing Dr. Jeraldine Loots at [facility] and he was originally placed on oxycodone.  He cannot tolerate this medication as it caused altered mental status.  He is currently taking gabapentin 600 mg 4 times a day in addition to Mirapex 1 mg at bedtime.  The patient has been discussing with his PCP.  His PCP would like to decrease his dose of gabapentin due to kidney function.  The patient is asking for our input as well as he does not want to continue traveling to do.  He would like [name] to take over care of his diagnosis of restless legs.  He states that he has the most trouble at bedtime.  However during the day when his gabapentin dose is due he begins to have symptoms.  He returns today for an evaluation.  Compliance Report Usage 10/15/2020 - 11/13/2020 Usage days 26/30 days (87%) >= 4 hours 20 days (67%) Average usage (days used) 5 hours 43 minutes   AirSense 10 CPAP Serial number [id number] Mode CPAP Set pressure 9 cmH2O EPR Fulltime EPR level 3  Therapy Leaks - L/min 95th percentile: 16.9 Events per hour AHI: 3.7  11/11/19: James Bell is an 87 year old male with a history of obstructive sleep apnea on CPAP.  His download indicates that he uses machine 27 out of 30 days for compliance of 90%.  He uses machine greater than 4 hours 19 days for compliance of 63%.  On average he uses his machine 5 hours and 31 minutes.  His residual AHI is 1.7 on 9 cm of water with EPR 3.  He does not have a significant leak.  Reports that the machine is working well for him.  He denies any new issues.  Returns today for follow-up  HISTORY 11/04/2018: I reviewed  his CPAP compliance data from 09/30/2018 through 10/29/2018, which is a total of 30 days, during which time he used his CPAP every night with percent used days greater than 4 hours at 83%, indicating very good compliance with an average usage of 6 hours and 4 minutes, residual AHI at goal at 1.4 per hour, leak acceptable with the 95th percentile at 16.4 L/m on a pressure of 9 cm with EPR of 3. He reports doing well with CPAP therapy. He is up-to-date with his supplies typically, using nasal pillows. He is reducing Lyrica, which was for RLS, feels like he is having SEs. Has not had Botox in some time, he states. Has had some falls when R leg gave out. Feels like he may need rehab again. Using 4 point cane consistently at home.   REVIEW OF SYSTEMS: Out of a complete 14 system review of symptoms, the patient complains only of the following symptoms, and all other reviewed systems are negative.  ESS 8 FSS 21   ALLERGIES: Allergies  Allergen Reactions  . Crestor [Rosuvastatin]     Other reaction(s): muscle pain 2.16.2022 Pt is current on this medication without any issues.  . Dulaglutide     Other reaction(s): upset stomach  . Empagliflozin     Other reaction(s): yeast infection  . Gabapentin Other (See Comments)  At high doses - BLURRED VISION, BALLANCE, CONCENTRATION PROBLEMS, RESTLESSNESS, NOT SLEEPING, ? LOST OF STRENGTH, UNSTEADINESS  . Lisinopril Cough    tachycardia  . Nexium [Esomeprazole] Other (See Comments)    Headache  . Niacin And Related Other (See Comments)    flushing  . Oxycodone Other (See Comments)    Even 1/2 tablet of 5mg  caused increased confusion  . Pregabalin     Other reaction(s): MALAISE, SLIGHT NAUSEA, SLIGHT HEADACHE    HOME MEDICATIONS: Outpatient Medications Prior to Visit  Medication Sig Dispense Refill  . Alpha-Lipoic Acid 600 MG CAPS Take 1 capsule (600 mg total) by mouth daily. 30 capsule 11  . BD PEN NEEDLE NANO 2ND GEN 32G X 4 MM MISC 1 each by  Other route daily. 100 each 0  . celecoxib (CELEBREX) 200 MG capsule Take 1 capsule (200 mg total) by mouth daily. (Patient taking differently: Take 200 mg by mouth daily as needed for mild pain.) 90 capsule 1  . clopidogrel (PLAVIX) 75 MG tablet Take 1 tablet (75 mg total) by mouth daily. 90 tablet 3  . Coenzyme Q10 (COQ10) 150 MG CAPS Take 1 capsule by mouth 2 (two) times daily.    . COMBIGAN 0.2-0.5 % ophthalmic solution Place 1 drop into the right eye in the morning and at bedtime.    . diclofenac Sodium (VOLTAREN) 1 % GEL Apply 2 g topically 3 (three) times daily as needed. (Patient taking differently: Apply 2 g topically 3 (three) times daily as needed (pain).) 100 g 1  . DULoxetine (CYMBALTA) 60 MG capsule TAKE 1 CAPSULE DAILY (Patient taking differently: Take 60 mg by mouth daily.) 90 capsule 3  . famotidine (PEPCID) 20 MG tablet Take 1 tablet (20 mg total) by mouth at bedtime. (Patient taking differently: Take 20 mg by mouth daily.)    . gabapentin (NEURONTIN) 300 MG capsule Take 1 capsule (300 mg total) by mouth 3 (three) times daily.    . insulin degludec (TRESIBA FLEXTOUCH) 200 UNIT/ML FlexTouch Pen Inject 90 Units into the skin at bedtime. As needed    . insulin lispro (HUMALOG) 100 UNIT/ML KwikPen Inject 4 Units into the skin daily at 12 noon. With largest meal    . l-methylfolate-B6-B12 (METANX) 3-35-2 MG TABS tablet Take 1 tablet by mouth daily. 30 tablet 0  . levothyroxine (SYNTHROID) 50 MCG tablet Take 1 tablet (50 mcg total) by mouth daily before breakfast. Per endo [name]    . LUMIGAN 0.01 % SOLN Place 1 drop into the right eye at bedtime.    . melatonin 5 MG TABS Take 5 mg by mouth at bedtime as needed for sleep.    . metoprolol tartrate (LOPRESSOR) 25 MG tablet TAKE ONE-HALF (1/2) TABLET (12.5 MG) AT BEDTIME WITH MORNING DOSE IF NEEDED (Patient taking differently: Take 25 mg by mouth at bedtime.) 90 tablet 3  . nitroGLYCERIN (NITROSTAT) 0.4 MG SL tablet DISSOLVE 1 TABLET UNDER  THE TONGUE EVERY 5 MINUTES AS NEEDED FOR CHEST PAIN (Patient taking differently: Place 0.4 mg under the tongue every 5 (five) minutes as needed for chest pain.) 25 tablet 1  . Omega-3 Fatty Acids (FISH OIL) 1000 MG CAPS Take 1 capsule (1,000 mg total) by mouth in the morning and at bedtime.  0  . ondansetron (ZOFRAN) 4 MG tablet Take 1 tablet (4 mg total) by mouth every 8 (eight) hours as needed for nausea or vomiting. 30 tablet 0  . pramipexole (MIRAPEX) 1 MG tablet Take 1 mg  by mouth See admin instructions. At 4pm    . RABEprazole (ACIPHEX) 20 MG tablet TAKE 1 TABLET DAILY (Patient taking differently: Take 20 mg by mouth daily.) 90 tablet 3  . rosuvastatin (CRESTOR) 40 MG tablet Take 1 tablet (40 mg total) by mouth daily. 90 tablet 3  . Semaglutide, 1 MG/DOSE, (OZEMPIC, 1 MG/DOSE,) 2 MG/1.5ML SOPN Inject 1 mg into the skin every Saturday.    . senna-docusate (SENOKOT-S) 8.6-50 MG tablet Take 1 tablet by mouth at bedtime. 30 tablet 0  . tamsulosin (FLOMAX) 0.4 MG CAPS capsule Take 1 capsule (0.4 mg total) by mouth daily after breakfast. 30 capsule 0  . valsartan (DIOVAN) 40 MG tablet Take 0.5 tablets (20 mg total) by mouth daily.    . vitamin C (ASCORBIC ACID) 500 MG tablet Take 1,000 mg by mouth daily.      No facility-administered medications prior to visit.    PAST MEDICAL HISTORY: Past Medical History:  Diagnosis Date  . Anemia   . Basal cell carcinoma 2018   R [place]   . Basal cell carcinoma of nose 2002   bridge of nose  . BPH with obstruction/lower urinary tract symptoms    failed flomax, uroxatral, myrbetriq - sees urology [name]) pending videourodynamics  . CKD (chronic kidney disease) stage 3, GFR 30-59 ml/min (HCC) 03/11/2012   Renal US 02/2014 - multiple kidney cysts   . CVA (cerebral infarction) 03/07/12   slurred speech; right hemplegia, left handed - completed PT 07/2014 (17 sessions)  . Depression    mild, on cymbalta per prior PCP  . Erectile dysfunction    per  urology (prostaglandin injection)  . GERD (gastroesophageal reflux disease)    with sliding HH  . H/O hiatal hernia   . History of kidney problems 12/2010   lacerated left kidney after fall  . HLD (hyperlipidemia)   . Hx of basal cell carcinoma 07/10/2017   R. lat. forehead near hair line  . Kidney cysts 02/2014   bilateral (renal US)  . Migraines 03/11/12   now rare  . OSA on CPAP    sleep study 01/2014 - severe, CPAP at 10, previously on nuvigil (Dr. [name])  . Palpitations 01/2012   holter WNL, rare PAC/PVCs  . Presbycusis of both ears 2016   rec amplification [facility])  . Restless leg syndrome    well controlled on mirapex  . Rosacea    [place]  . Stroke ([place])   . TIA (transient ischemic attack)   . Type 2 diabetes, controlled, with neuropathy ([place]) 2011   [name] in [place]    PAST SURGICAL HISTORY: Past Surgical History:  Procedure Laterality Date  . ABI  2007   WNL  . CARDIAC CATHETERIZATION  2004   normal; [place]  . CARDIOVASCULAR STRESS TEST  02/2014   WNL, low risk scan, EF 68%  . CAROTID ENDARTERECTOMY    . CATARACT EXTRACTION W/ INTRAOCULAR LENS IMPLANT  11//2012 - 08/2011  . COLONOSCOPY  03/2011   poor prep, unable to biopsy polyp in tic fold, rpt 3 mo [name])  . COLONOSCOPY  06/2011   mult polyps adenomatous, severe diverticulosis, rpt 3 yrs [name])  . CYSTOSCOPY WITH URETHRAL DILATATION N/A 07/24/2013   Procedure: CYSTOSCOPY WITH URETHRAL DILATATION;  Surgeon: [name], MD;  Location: [facility];  Service: Urology;  Laterality: N/A;  . ESOPHAGOGASTRODUODENOSCOPY  2006   duodenitis, medual HH, Grade 2 reflux, mild gastritis  . ESOPHAGOGASTRODUODENOSCOPY  07/2011   mild  chronic gastritis  . [place]  . [place]; 2002   left, then repaired  . MRI brain  04/2010   WNL  . PROSTATE SURGERY  02/2009   PVP (laser)  . SKIN CANCER EXCISION  2002   bridge of nose, BCC  . TONSILLECTOMY AND ADENOIDECTOMY  ~ 1945  .  TRANSURETHRAL INCISION OF PROSTATE N/A 07/24/2013   Procedure: TRANSURETHRAL INCISION OF THE PROSTATE (TUIP);  Surgeon: [name], MD;  Location: [facility];  Service: Urology;  Laterality: N/A;  . URETHRAL DILATION  2014   [place]    FAMILY HISTORY: Family History  Problem Relation Age of Onset  . Stroke Father   . Lung cancer Father 18        smoker  . Stroke Brother   . Diabetes Brother 72  . Lung cancer Brother   . Alcoholism Sister   . Kidney disease Sister   . Coronary artery disease Neg Hx     SOCIAL HISTORY: Social History   Socioeconomic History  . Marital status: Divorced    Spouse name: Not on file  . Number of children: 2  . Years of education: MD  . Highest education level: Not on file  Occupational History  . Occupation: retired    Fish farm manager: RETIRED  Tobacco Use  . Smoking status: Never Smoker  . Smokeless tobacco: Never Used  . Tobacco comment: "used to smoke pipe when I was younger"  Vaping Use  . Vaping Use: Never used  Substance and Sexual Activity  . Alcohol use: Yes    Alcohol/week: 4.0 standard drinks    Types: 2 Glasses of wine, 2 Shots of liquor per week    Comment: occasional  . Drug use: No  . Sexual activity: Yes  Other Topics Concern  . Not on file  Social History Narrative   Caffeine: 1 cup coffee/day    Occupation: ophthalmologist, retired   Edu: MD   Activity: going to gym 3x/wk with trainer   Diet: some water, fruits/vegetables some, red meat a few times, fish 1x/wk   POA - [name] (Daughter)   Social Determinants of Health   Financial Resource Strain: Not on file  Food Insecurity: Not on file  Transportation Needs: Not on file  Physical Activity: Not on file  Stress: Not on file  Social Connections: Not on file  Intimate Partner Violence: Not on file      PHYSICAL EXAM  Vitals:   11/15/20 1058  BP: 127/72  Pulse: 66  Weight: 188 lb (85.3 kg)  Height: 5\' 8"  (1.727 m)   Body mass index is 28.59  kg/m.  Generalized: Well developed, in no acute distress  Chest: Lungs clear to auscultation bilaterally  Neurological examination  Mentation: Alert oriented to time, place, history taking. Follows all commands speech and language fluent Cranial nerve II-XII: Extraocular movements were full, visual field were full on confrontational test Head turning and shoulder shrug  were normal and symmetric. Motor: The motor testing reveals 5 over 5 strength of all 4 extremities. Good symmetric motor tone is noted throughout.  Boot on the right foot due to a wound Sensory: Sensory testing is intact to soft touch on all 4 extremities. No evidence of extinction is noted.  Gait and station: Gait is normal.    DIAGNOSTIC DATA (LABS, IMAGING, TESTING) - I reviewed patient records, labs, notes, testing and imaging myself where available.  Lab Results  Component Value Date  WBC 8.0 11/10/2020   HGB 12.4 (L) 11/10/2020   HCT 36.7 (L) 11/10/2020   MCV 91.1 11/10/2020   PLT 184.0 11/10/2020      Component Value Date/Time   NA 137 11/10/2020 1308   NA 139 12/05/2019 0000   K 4.4 11/10/2020 1308   CL 103 11/10/2020 1308   CO2 28 11/10/2020 1308   GLUCOSE 143 (H) 11/10/2020 1308   BUN 32 (H) 11/10/2020 1308   CREATININE 1.50 11/10/2020 1308   CALCIUM 8.9 11/10/2020 1308   PROT 6.4 (L) 11/02/2020 1200   ALBUMIN 3.9 11/10/2020 1308   AST 24 11/02/2020 1200   ALT 16 11/02/2020 1200   ALKPHOS 39 11/02/2020 1200   BILITOT 0.2 (L) 11/02/2020 1200   GFRNONAA 42 (L) 11/05/2020 0149   GFRAA 54 (L) 04/21/2020 1400   Lab Results  Component Value Date   CHOL 139 04/15/2020   HDL 18.10 (L) 04/15/2020   LDLCALC 53 12/05/2019   LDLDIRECT 87.0 04/15/2020   TRIG 244.0 (H) 04/15/2020   CHOLHDL 8 04/15/2020   Lab Results  Component Value Date   HGBA1C 8.0 (H) 11/03/2020   Lab Results  Component Value Date   VITAMINB12 >1500 (H) 07/18/2017   Lab Results  Component Value Date   TSH 2.763  11/03/2020      ASSESSMENT AND PLAN 87 y.o. year old male  has a past medical history of Anemia, Basal cell carcinoma (2018), Basal cell carcinoma of nose (2002), BPH with obstruction/lower urinary tract symptoms, CKD (chronic kidney disease) stage 3, GFR 30-59 ml/min (HCC) (03/11/2012), CVA (cerebral infarction) (03/07/12), Depression, Erectile dysfunction, GERD (gastroesophageal reflux disease), H/O hiatal hernia, History of kidney problems (12/2010), HLD (hyperlipidemia), basal cell carcinoma (07/10/2017), Kidney cysts (02/2014), Migraines (03/11/12), OSA on CPAP, Palpitations (01/2012), Presbycusis of both ears (2016), Restless leg syndrome, Rosacea, Stroke ([place]), TIA (transient ischemic attack), and Type 2 diabetes, controlled, with neuropathy ([place]) (2011). here with:  1.  Obstructive sleep apnea on CPAP  -Suboptimal compliance  -Encouraged to use the machine the entire time he is sleeping -Advised if his symptoms worsen or he develops new symptoms he should let us know.  2. RLS  --The patient has been seeing Dr. Jeraldine Loots at [facility] for his restless legs.  In the past he was on oxycodone but was unable to tolerate this due to altered mental status.  The patient is currently on gabapentin 600 mg 4 times a day and Mirapex 1 mg at bedtime.  I discussed with Dr. Rexene Alberts.  She does not feel comfortable adding or adjusting any medication until he is able to reduce his dose of gabapentin.  Probably in the patient's best interest to continue following with Dr. Jeraldine Loots until he is able to get the patient on a regimen that treats his RLS.  I spent 30 minutes of face-to-face and non-face-to-face time with patient.  This included previsit chart review, lab review, study review, order entry, electronic health record documentation, patient education.    Ward Givens, MSN, NP-C 11/15/2020, 11:18 AM [facility] [address] [place], [place] [address] [phone]  I reviewed the  above note and documentation by the Nurse Practitioner and agree with the history, exam, assessment and plan as outlined above. I was available for consultation. Star Age, MD, PhD [facility])

## 2020-11-16 ENCOUNTER — Telehealth: Payer: Self-pay | Admitting: *Deleted

## 2020-11-16 DIAGNOSIS — Z794 Long term (current) use of insulin: Secondary | ICD-10-CM | POA: Diagnosis not present

## 2020-11-16 DIAGNOSIS — I69351 Hemiplegia and hemiparesis following cerebral infarction affecting right dominant side: Secondary | ICD-10-CM

## 2020-11-16 DIAGNOSIS — K219 Gastro-esophageal reflux disease without esophagitis: Secondary | ICD-10-CM

## 2020-11-16 DIAGNOSIS — E1122 Type 2 diabetes mellitus with diabetic chronic kidney disease: Secondary | ICD-10-CM

## 2020-11-16 DIAGNOSIS — Z7902 Long term (current) use of antithrombotics/antiplatelets: Secondary | ICD-10-CM

## 2020-11-16 DIAGNOSIS — I10 Essential (primary) hypertension: Secondary | ICD-10-CM | POA: Diagnosis not present

## 2020-11-16 DIAGNOSIS — G9340 Encephalopathy, unspecified: Secondary | ICD-10-CM

## 2020-11-16 DIAGNOSIS — N183 Chronic kidney disease, stage 3 unspecified: Secondary | ICD-10-CM

## 2020-11-16 DIAGNOSIS — F329 Major depressive disorder, single episode, unspecified: Secondary | ICD-10-CM

## 2020-11-16 NOTE — Telephone Encounter (Signed)
I called and spoke to pt and relayed the message per MM/NP after her consulting with Dr, Rexene Alberts.  He verbalized understanding.  Will continue to f/u with Dr. Jeraldine Loots.

## 2020-11-16 NOTE — Telephone Encounter (Signed)
-----   Message from Ward Givens, NP sent at 11/16/2020  1:51 PM EST ----- James Bell: Please call the patient and let him know that I spoke to Dr. Rexene Alberts about taking over care for RLS and    She does not feel comfortable adding or adjusting any medication until he is able to reduce his dose of gabapentin.  Probably in the patient's best interest to continue following with Dr. Jeraldine Loots until he is able to get the patient on a regimen that treats his RLS.

## 2020-11-17 ENCOUNTER — Telehealth: Payer: Self-pay | Admitting: Family Medicine

## 2020-11-17 NOTE — Telephone Encounter (Signed)
Pt called in wanted to know about canceling his appointment he has a conflict

## 2020-11-17 NOTE — Chronic Care Management (AMB) (Addendum)
Contacted [name] to confirm appointment change. He asked for a later appointment. Initial face to face appointment was rescheduled for 11/18/20 at 11:15 AM per patient's request. [name] has been notified.   Follow-Up:  Pharmacist Review and Scheduled Follow-Up With Clinical Pharmacist  [name], CPP notified  Margaretmary Dys, [place] [phone]  Total time spent for month: 10

## 2020-11-18 ENCOUNTER — Telehealth: Payer: Self-pay

## 2020-11-18 ENCOUNTER — Ambulatory Visit (INDEPENDENT_AMBULATORY_CARE_PROVIDER_SITE_OTHER): Payer: Medicare Other

## 2020-11-18 ENCOUNTER — Other Ambulatory Visit: Payer: Self-pay

## 2020-11-18 DIAGNOSIS — G9340 Encephalopathy, unspecified: Secondary | ICD-10-CM | POA: Diagnosis not present

## 2020-11-18 DIAGNOSIS — E1122 Type 2 diabetes mellitus with diabetic chronic kidney disease: Secondary | ICD-10-CM | POA: Diagnosis not present

## 2020-11-18 DIAGNOSIS — I69351 Hemiplegia and hemiparesis following cerebral infarction affecting right dominant side: Secondary | ICD-10-CM | POA: Diagnosis not present

## 2020-11-18 DIAGNOSIS — Z794 Long term (current) use of insulin: Secondary | ICD-10-CM | POA: Diagnosis not present

## 2020-11-18 DIAGNOSIS — I10 Essential (primary) hypertension: Secondary | ICD-10-CM | POA: Diagnosis not present

## 2020-11-18 DIAGNOSIS — Z7902 Long term (current) use of antithrombotics/antiplatelets: Secondary | ICD-10-CM | POA: Diagnosis not present

## 2020-11-18 DIAGNOSIS — E1140 Type 2 diabetes mellitus with diabetic neuropathy, unspecified: Secondary | ICD-10-CM | POA: Diagnosis not present

## 2020-11-18 DIAGNOSIS — IMO0002 Reserved for concepts with insufficient information to code with codable children: Secondary | ICD-10-CM

## 2020-11-18 DIAGNOSIS — E1165 Type 2 diabetes mellitus with hyperglycemia: Secondary | ICD-10-CM

## 2020-11-18 DIAGNOSIS — N183 Chronic kidney disease, stage 3 unspecified: Secondary | ICD-10-CM | POA: Diagnosis not present

## 2020-11-18 DIAGNOSIS — G2581 Restless legs syndrome: Secondary | ICD-10-CM

## 2020-11-18 DIAGNOSIS — K219 Gastro-esophageal reflux disease without esophagitis: Secondary | ICD-10-CM | POA: Diagnosis not present

## 2020-11-18 MED ORDER — BD PEN NEEDLE NANO 2ND GEN 32G X 4 MM MISC: 0 refills | Status: DC

## 2020-11-18 NOTE — Telephone Encounter (Signed)
Received refill request from [facility].  E-scribed refill.

## 2020-11-18 NOTE — Progress Notes (Signed)
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  The CCM team was consulted for assistance with disease management and care coordination needs.    Engaged with patient face to face for initial visit in response to provider referral for pharmacy case management and/or care coordination services.   CCM Consent 10/11/20  Consent to Services:  The patient was given the following information about Chronic Care Management services today, agreed to services, and gave verbal consent: 1. CCM service includes personalized support from designated clinical staff supervised by the primary care provider, including individualized plan of care and coordination with other care providers 2. 24/7 contact phone numbers for assistance for urgent and routine care needs. 3. Service will only be billed when office clinical staff spend 20 minutes or more in a month to coordinate care. 4. Only one practitioner may furnish and bill the service in a calendar month. 5.The patient may stop CCM services at any time (effective at the end of the month) by phone call to the office staff. 6. The patient will be responsible for cost sharing (co-pay) of up to 20% of the service fee (after annual deductible is met). Patient agreed to services and consent obtained.  Patient Care Team: James Bush, MD as PCP - General (Family Medicine) Delrae Rend, MD as Consulting Physician (Endocrinology) Dingeldein, Remo Lipps, MD as Consulting Physician (Ophthalmology) Pieter Partridge, DO as Consulting Physician (Neurology) Letta Pate Luanna Salk, MD as Consulting Physician (Physical Medicine and Rehabilitation) [name], [facility] as Pharmacist (Pharmacist)  Recent office visits: 11/15/20 - Lab result notes - Dr Lin Landsman, Your kidney function returned impaired but  improved from previous values. We should limit gabapentin to 980m/day. Monitor blood pressures, if they start trending up, ok to restart valsartan 263mif needed.  11/10/20 - PCP - Recent hospitalization with AMS thought to be due to oxycodone. Likely dehydration which led to AKI + oxycodone use. He has decided to stop oxycodone, which has been added to intolerance list per his request. Chief concern is RLS. Continues mirapex 91m75maily as well as gabapentin 600m44mD - discussed may need to decrease dose based on kidney function. ARB currently on hold, bp stable today - check renal panel. Flomax started in the hospital - he doesn't notice significant change in urination. Discussed PM dosing.  10/18/20 - PCP   Recent consult visits: 11/15/20 - Neurology - CPAP working well, more concerned about his RLS. He states that [facility] on [facility]codone.  He cannot tolerate this medication as it caused altered mental status.  He is currently taking gabapentin 600 mg 4 times a day in addition to Mirapex 1 mg at bedtime.  She does not feel comfortable adding or adjusting any medication until he is able to reduce his dose of gabapentin.  Probably in the patient's best interest to continue following with Dr. SpecJeraldine Lootsil he is able to get the patient on a regimen that treats his RLS. 10/19/20 - Endocrinology - note unavailable  Hospital visits: 11/02/20 - ED - diplopia, fall, delirium   Objective:  Lab Results  Component Value Date   CREATININE 1.50 11/10/2020   BUN 32 (H) 11/10/2020   GFR 41.64 (L) 11/10/2020   GFRNONAA 42 (L) 11/05/2020   GFRAA  54 (L) 04/21/2020   NA 137 11/10/2020   K 4.4 11/10/2020   CALCIUM 8.9 11/10/2020   CO2 28 11/10/2020    Lab Results  Component Value Date/Time   HGBA1C 8.0 (H) 11/03/2020 04:43 AM   HGBA1C 7.9 (A) 06/01/2020 11:26 AM   HGBA1C 8.0 (H) 04/15/2020 07:33 AM   HGBA1C 8.6 12/05/2019 12:00 AM   HGBA1C 7.9  08/21/2019 12:00 AM   GFR 41.64 (L) 11/10/2020 01:08 PM   GFR 46.43 (L) 04/15/2020 07:33 AM   MICROALBUR 2.0 (H) 04/21/2013 11:19 AM   MICROALBUR 1.9 07/05/2012 11:21 AM    Last diabetic Eye exam:  Lab Results  Component Value Date/Time   HMDIABEYEEXA No Retinopathy 11/23/2020 12:00 AM    Last diabetic Foot exam: completed regularly with endocrinology per chart review  Lab Results  Component Value Date   CHOL 139 04/15/2020   HDL 18.10 (L) 04/15/2020   LDLCALC 53 12/05/2019   LDLDIRECT 87.0 04/15/2020   TRIG 244.0 (H) 04/15/2020   CHOLHDL 8 04/15/2020    Hepatic Function Latest Ref Rng & Units 11/10/2020 11/02/2020 07/01/2020  Total Protein 6.5 - 8.1 g/dL - 6.4(L) 6.6  Albumin 3.5 - 5.2 g/dL 3.9 3.1(L) 3.3(L)  AST 15 - 41 U/L - 24 24  ALT 0 - 44 U/L - 16 18  Alk Phosphatase 38 - 126 U/L - 39 40  Total Bilirubin 0.3 - 1.2 mg/dL - 0.2(L) 0.7    Lab Results  Component Value Date/Time   TSH 2.763 11/03/2020 04:43 AM   TSH 0.90 04/15/2020 07:33 AM   TSH 3.09 12/05/2019 12:00 AM   TSH 4.91 08/21/2019 12:00 AM   TSH 4.37 10/30/2018 10:28 AM   FREET4 0.75 10/30/2018 10:28 AM   FREET4 0.72 07/18/2017 01:51 PM    CBC Latest Ref Rng & Units 11/10/2020 11/04/2020 11/02/2020  WBC 4.0 - 10.5 K/uL 8.0 8.3 -  Hemoglobin 13.0 - 17.0 g/dL 12.4(L) 11.7(L) 12.6(L)  Hematocrit 39.0 - 52.0 % 36.7(L) 34.7(L) 37.0(L)  Platelets 150.0 - 400.0 K/uL 184.0 166 -    Lab Results  Component Value Date/Time   VD25OH 40.46 04/15/2020 07:33 AM   VD25OH 35.64 01/23/2019 08:49 AM    Clinical ASCVD: Yes  The ASCVD Risk score Mikey Bussing DC Jr., et al., 2013) failed to calculate for the following reasons:   The 2013 ASCVD risk score is only valid for ages 49 to 32    Depression screen PHQ 2/9 06/01/2020 04/22/2020 01/22/2019  Decreased Interest 0 0 0  Down, Depressed, Hopeless 0 0 0  PHQ - 2 Score 0 0 0  Altered sleeping 1 1 0  Tired, decreased energy 1 1 0  Change in appetite 1 1 0  Feeling bad or  failure about yourself  0 0 0  Trouble concentrating 1 2 0  Moving slowly or fidgety/restless 0 3 0  Suicidal thoughts 0 0 0  PHQ-9 Score 4 8 0  Difficult doing work/chores - - Not difficult at all  Some recent data might be hidden    Social History   Tobacco Use  Smoking Status Never Smoker  Smokeless Tobacco Never Used  Tobacco Comment   "used to smoke pipe when I was younger"   BP Readings from Last 3 Encounters:  11/30/20 126/68  11/15/20 127/72  11/10/20 122/60   Pulse Readings from Last 3 Encounters:  11/30/20 74  11/15/20 66  11/10/20 74   Wt Readings from Last 3 Encounters:  11/30/20 190 lb (86.2  kg)  11/15/20 188 lb (85.3 kg)  11/10/20 189 lb 6 oz (85.9 kg)    Assessment/Interventions: Review of patient past medical history, allergies, medications, health status, including review of consultants reports, laboratory and other test data, was performed as part of comprehensive evaluation and provision of chronic care management services.   SDOH:  (Social Determinants of Health) assessments and interventions performed: Yes SDOH Interventions   Flowsheet Row Most Recent Value  SDOH Interventions   Financial Strain Interventions Intervention Not Indicated      CCM Care Plan  Allergies  Allergen Reactions  . Crestor [Rosuvastatin]     Other reaction(s): muscle pain 2.16.2022 Pt is current on this medication without any issues.  . Dulaglutide     Other reaction(s): upset stomach  . Empagliflozin     Other reaction(s): yeast infection  . Gabapentin Other (See Comments)    At high doses - BLURRED VISION, BALLANCE, CONCENTRATION PROBLEMS, RESTLESSNESS, NOT SLEEPING, ? LOST OF STRENGTH, UNSTEADINESS  . Lisinopril Cough    tachycardia  . Nexium [Esomeprazole] Other (See Comments)    Headache  . Niacin And Related Other (See Comments)    flushing  . Oxycodone Other (See Comments)    Even 1/2 tablet of 68m caused increased confusion  . Pregabalin     Other  reaction(s): MALAISE, SLIGHT NAUSEA, SLIGHT HEADACHE    Medications Reviewed Today    Reviewed [facility](Pharmacist) on 12/08/20 at 1206  Med List Status: <None>  Medication Order Taking? Sig Documenting Provider Last Dose Status Informant  Alpha-Lipoic Acid 600 MG CAPS [id number] Take 1 capsule (600 mg total) by mouth daily. GRia Bush MD Taking Active Multiple Informants  BD PEN NEEDLE NANO 2ND GEN 32G X 4 MM MISC [id number] Use to inject medication daily GRia Bush MD Taking Active   celecoxib (CELEBREX) 200 MG capsule [id number] Take 1 capsule (200 mg total) by mouth daily.  Patient taking differently: Take 200 mg by mouth daily as needed for mild pain.   Copland, SFrederico Hamman MD Taking Active   clopidogrel (PLAVIX) 75 MG tablet [id number] Take 1 tablet (75 mg total) by mouth daily. GRia Bush MD Taking Active Multiple Informants  Coenzyme Q10 (COQ10) 150 MG CAPS [id number] Take 1 capsule by mouth 2 (two) times daily. [provider] Taking Active Multiple Informants  COMBIGAN 0.2-0.5 % ophthalmic solution [id number] Place 1 drop into the right eye in the morning and at bedtime. [provider] Taking Active Multiple Informants  diclofenac Sodium (VOLTAREN) 1 % GEL [id number] Apply 2 g topically 3 (three) times daily as needed.  Patient taking differently: Apply 2 g topically 3 (three) times daily as needed (pain).   GRia Bush MD Taking Active   DULoxetine (CYMBALTA) 60 MG capsule [id number] TAKE 1 CAPSULE DAILY GRia Bush MD Taking Active Multiple Informants  famotidine (PEPCID) 20 MG tablet [id number] Take 1 tablet (20 mg total) by mouth at bedtime.  Patient taking differently: Take 20 mg by mouth daily. PRN   GRia Bush MD Taking Active Self  gabapentin (NEURONTIN) 300 MG capsule [id number] Take 600 mg by mouth 3 (three) times daily. 600 mg BID is his minimum dose without having  breakthrough symptoms. He is aware 900 mg/day is recommended per kidney function. GRia Bush MD Taking Active   insulin degludec (TRESIBA FLEXTOUCH) 200 UNIT/ML FlexTouch Pen [id number] Inject 90 Units into the skin at bedtime. As needed GRia Bush MD  Taking Active Multiple Informants  insulin lispro (HUMALOG) 100 UNIT/ML KwikPen [id number] Yes Inject 4 Units into the skin daily at 12 noon. With largest meal; Sliding Scale prior to meals if needed [provider] Taking Active Self  l-methylfolate-B6-B12 (METANX) 3-35-2 MG TABS tablet [id number] Yes Take 1 tablet by mouth daily. Swayze, Ava, DO Taking Active Multiple Informants  levothyroxine (SYNTHROID) 50 MCG tablet [id number] Yes Take 1 tablet (50 mcg total) by mouth daily before breakfast. Per endo Dr Trish Fountain, MD Taking Active Multiple Informants  LORazepam (ATIVAN) 0.5 MG tablet [id number]  Take 1 tablet (0.5 mg total) by mouth at bedtime as needed for sleep. James Bush, MD  Active   LUMIGAN 0.01 % Bailey Mech [id number] Yes Place 1 drop into the right eye at bedtime. James Bush, MD Taking Active Multiple Informants  melatonin 5 MG TABS [id number] Yes Take 5 mg by mouth at bedtime. [provider] Taking Active Self  metoprolol tartrate (LOPRESSOR) 25 MG tablet [id number] Yes TAKE ONE-HALF (1/2) TABLET (12.5 MG) AT BEDTIME WITH MORNING DOSE IF NEEDED  Patient taking differently: Take 25 mg by mouth at bedtime. PRN palpitations   James Bush, MD Taking Active Self           Med Note Andree Elk, Zoeya Gramajo   Fri Nov 19, 2020  3:05 PM)    nitroGLYCERIN (NITROSTAT) 0.4 MG SL tablet [id number] Yes DISSOLVE 1 TABLET UNDER THE TONGUE EVERY 5 MINUTES AS NEEDED FOR CHEST PAIN  Patient taking differently: Place 0.4 mg under the tongue every 5 (five) minutes as needed for chest pain.   James Bush, MD Taking Active            Med Note Nash Mantis, TIFFANI S   Wed Nov 03, 2020  1:34 PM)    Omega-3 Fatty  Acids (FISH OIL) 1000 MG CAPS [id number] Yes Take 1 capsule (1,000 mg total) by mouth in the morning and at bedtime. James Bush, MD Taking Active Multiple Informants  ondansetron [facility]) 4 MG tablet [id number] Yes Take 1 tablet (4 mg total) by mouth every 8 (eight) hours as needed for nausea or vomiting. James Bush, MD Taking Active   pramipexole (MIRAPEX) 1 MG tablet [id number] Yes Take 1 mg by mouth See admin instructions. At Harlon Flor, MD Taking Active Multiple Informants  RABEprazole (ACIPHEX) 20 MG tablet [id number] Yes TAKE 1 TABLET DAILY James Bush, MD Taking Active   rosuvastatin (CRESTOR) 40 MG tablet [id number] Yes Take 1 tablet (40 mg total) by mouth daily. James Bush, MD Taking Active Multiple Informants  Semaglutide, 1 MG/DOSE, (OZEMPIC, 1 MG/DOSE,) 2 MG/1.5ML SOPN [id number] Yes Inject 1 mg into the skin every Saturday. [provider] Taking Active Multiple Informants  senna-docusate (SENOKOT-S) 8.6-50 MG tablet [id number] Yes Take 1 tablet by mouth at bedtime. Florencia Reasons, MD Taking Active   tamsulosin [facility]) 0.4 MG CAPS capsule [id number] Yes Take 1 capsule (0.4 mg total) by mouth daily after breakfast.  Patient taking differently: Take 0.4 mg by mouth daily after supper.   Florencia Reasons, MD Taking Active Self  valsartan (DIOVAN) 40 MG tablet [id number] No Take 0.5 tablets (20 mg total) by mouth daily. Florencia Reasons, MD Not Taking Active   vitamin C (ASCORBIC ACID) 500 MG tablet [id number] Yes Take 1,000 mg by mouth daily.  [provider] Taking Active Multiple Informants          Patient Active Problem List   Diagnosis Date Noted  . Acute encephalopathy 11/02/2020  . Dysuria  10/18/2020  . Healthcare maintenance 06/01/2020  . Involuntary movements 04/25/2020  . Post-nasal drainage 07/16/2019  . Recurrent productive cough 05/31/2019  . Hx of basal cell carcinoma 07/10/2017  . Rosacea   . Gait disturbance, post-stroke 10/05/2015  .  Presbycusis of both ears   . Advanced care planning/counseling discussion 06/23/2015  . Impaired mobility 04/23/2015  . Hearing loss of right ear due to cerumen impaction 03/23/2015  . HTN (hypertension) 03/18/2015  . Diastolic dysfunction 70/48/8891  . Spastic hemiparesis affecting dominant side ([place]) 07/07/2014  . Chest pressure 02/03/2014  . Medicare annual wellness visit, subsequent 11/06/2013  . Erectile dysfunction due to arterial insufficiency 02/05/2013  . MDD (major depressive disorder), recurrent episode, moderate ([place])   . Palpitations 06/26/2012  . Hyperlipidemia associated with type 2 diabetes mellitus ([place])   . Restless leg syndrome   . GERD (gastroesophageal reflux disease)   . BPH with obstruction/lower urinary tract symptoms   . History of CVA (cerebrovascular accident) 03/11/2012  . Hemiparesis affecting right side as late effect of stroke ([place]) 03/11/2012  . Type 2 diabetes mellitus, uncontrolled, with neuropathy ([place]) 03/11/2012  . OSA on CPAP 03/11/2012  . CKD stage 3 secondary to diabetes ([place]) 03/11/2012  . Migraines 03/11/2012  . Thrombocytopenia ([place]) 2011    Immunization History  Administered Date(s) Administered  . Fluad Quad(high Dose 65+) 06/05/2019, 06/01/2020  . Influenza Split 06/25/2012  . Influenza, High Dose Seasonal PF 06/17/2018  . Influenza,inj,Quad PF,6+ Mos 06/02/2013, 06/09/2014, 06/23/2015, 07/17/2016, 07/18/2017  . PFIZER(Purple Top)SARS-COV-2 Vaccination 10/12/2019, 11/03/2019, 06/30/2020  . Pneumococcal Conjugate-13 11/06/2013  . Pneumococcal Polysaccharide-23 09/19/2007, 09/18/2008  . Td 07/17/2016  . Tdap 05/24/2018  . Zoster 09/26/2007    Conditions to be addressed/monitored:  Hypertension, Hyperlipidemia, Diabetes, BPH and RLS  Care Plan : [place]  Updates made by [name], [place] since 12/08/2020 12:00 AM    Problem: CHL AMB "PATIENT-SPECIFIC PROBLEM"     Long-Range Goal: Disease Management   Start  Date: 11/18/2020  Priority: High  Note:    Current Barriers:  . Unable to achieve control of restless leg syndrome   Pharmacist Clinical Goal(s):  Marland Kitchen Over the next 90 days, patient will contact provider office for questions/concerns as evidenced notation of same in electronic health record through collaboration with PharmD and provider.   Interventions: . 1:1 collaboration with James Bush, MD regarding development and update of comprehensive plan of care as evidenced by provider attestation and co-signature . Inter-disciplinary care team collaboration (see longitudinal plan of care) . Comprehensive medication review performed; medication list updated in electronic medical record  Hypertension (BP goal <140/90) Query-Controlled -Current treatment: . None - ARB on hold as of 2/15 due to hospitalization dehydration/AKI, but patient unaware and was still taking as of today  -Medications previously tried: Lisinopril - tachycardia, Valsartan on hold   -Current home readings: none to report, reports BP has always been normal/low end  -Last UACR < 30, 2014 -Denies hypotensive/hypertensive symptoms -Educated on Importance of home blood pressure monitoring; -Counseled to monitor BP at home daily, document, and provide log at future appointments -Recommended hold valsartan (per PCP visit 11/10/20) and monitor BP daily for next 2 weeks. Will call pt for BP update.  Hyperlipidemia: (LDL goal < 70) -Controlled -Current treatment: . Rosuvastatin 40 mg - 1 tablet daily  -Medications previously tried: pt reports multiple prior failed therapies -Educated on Benefits of statin for ASCVD risk reduction; -Recommended to continue current medication  Diabetes (A1c goal <8%  ADA - age/comorbidities) -Controlled -Current medications: . Ozempic 1 mg - Inject once weekly . Tresiba - Inject 90 units at bedtime  . Humalog (Insulin Lispro) Inject 4 units prior to lunchtime meal (taking 5-15 units  pre-meal PRN if eating large meal) -Medications previously tried: Janumet, glyburide - pt reports upset stomach -Adherence - reports taking Humalog sliding scale (5-15 units, usually 15 units) prior to lunch. Confirms 90 units Tresiba and 1 mg Ozempic weekly. -Discussed BG goals 80-150 in the morning and < 200 2 hours after meals, for an A1c goal < 8% -Current home glucose readings - using Freestyle Libre . Post prandial glucose: reports lot of fluctuations, several above 200. Twice over 300 this month. . Denies any hypoglycemia -Denies hypoglycemic/hyperglycemic symptoms -Diet - reports he loves bread and is a big breakfast eater. With Freestyle he can see how this impacts his BG and tries to limit. -Educated onA1c and blood sugar goals; Prevention and management of hypoglycemic episodes; -Counseled to check feet daily and get yearly eye exams -Recommended to continue current medication; Follow up with Dr. [name] as planned.  RLS (Goal: Improve symptoms) -Uncontrolled -Current treatment  . Gabapentin 300 mg - 2 caps TID . Cymbalta 60 mg - 1 capsule daily . Pramipexole 1 mg - 1 tablet daily at supper time . Alpha lipoic acid 600 mg - 1 daily  -Medications previously tried: oxycodone - delirium, has reduced gabapentin from 1200 mg TID to current dose due to adverse effects/CKD. -Pt aware max dose recommended gabapentin is 900 mg/day per renal function. Reports unbearable pain on less than 1800 mg/day. He may take 1 additional tablet PRN breakthrough pain as well. He has follow up with Dr. Jeraldine Loots soon to discuss treatment options. Pt inquired about long acting Mirapex. -Recommended to continue current medication; Send patient info on long acting Mirapex.   Patient Goals/Self-Care Activities . Over the next 90 days, patient will:  - check glucose daily, document, and provide at future appointments  Follow Up Plan: Telephone follow up appointment with care management team member scheduled  for: 6 months; CMA call for BP review in 4 weeks.      Medication Assistance: None required.  Patient affirms current coverage meets needs.  Patient's preferred pharmacy is:  [place], [place] [address] [place] [address] Phone: [phone] Fax: [phone]  [id number] - [place], [place] AT [place] [address] [name] [address] Phone: [phone] Fax: [phone]  Pt endorses compliance to all medications.  We discussed: Benefits of medication synchronization, packaging and delivery as well as enhanced pharmacist oversight with [employer]. Patient decided to: Continue current medication management strategy  Care Plan and Follow Up Patient Decision:  Patient agrees to Care Plan and Follow-up.  [name], PharmD Clinical Pharmacist [facility] [phone]

## 2020-11-22 DIAGNOSIS — K219 Gastro-esophageal reflux disease without esophagitis: Secondary | ICD-10-CM | POA: Diagnosis not present

## 2020-11-22 DIAGNOSIS — I10 Essential (primary) hypertension: Secondary | ICD-10-CM | POA: Diagnosis not present

## 2020-11-22 DIAGNOSIS — E1122 Type 2 diabetes mellitus with diabetic chronic kidney disease: Secondary | ICD-10-CM | POA: Diagnosis not present

## 2020-11-22 DIAGNOSIS — Z7902 Long term (current) use of antithrombotics/antiplatelets: Secondary | ICD-10-CM | POA: Diagnosis not present

## 2020-11-22 DIAGNOSIS — I69351 Hemiplegia and hemiparesis following cerebral infarction affecting right dominant side: Secondary | ICD-10-CM | POA: Diagnosis not present

## 2020-11-22 DIAGNOSIS — Z794 Long term (current) use of insulin: Secondary | ICD-10-CM | POA: Diagnosis not present

## 2020-11-22 DIAGNOSIS — G9340 Encephalopathy, unspecified: Secondary | ICD-10-CM | POA: Diagnosis not present

## 2020-11-22 DIAGNOSIS — N183 Chronic kidney disease, stage 3 unspecified: Secondary | ICD-10-CM | POA: Diagnosis not present

## 2020-11-23 DIAGNOSIS — H40001 Preglaucoma, unspecified, right eye: Secondary | ICD-10-CM | POA: Diagnosis not present

## 2020-11-23 LAB — HM DIABETES EYE EXAM

## 2020-11-25 DIAGNOSIS — G9340 Encephalopathy, unspecified: Secondary | ICD-10-CM | POA: Diagnosis not present

## 2020-11-25 DIAGNOSIS — Z794 Long term (current) use of insulin: Secondary | ICD-10-CM | POA: Diagnosis not present

## 2020-11-25 DIAGNOSIS — K219 Gastro-esophageal reflux disease without esophagitis: Secondary | ICD-10-CM | POA: Diagnosis not present

## 2020-11-25 DIAGNOSIS — N183 Chronic kidney disease, stage 3 unspecified: Secondary | ICD-10-CM | POA: Diagnosis not present

## 2020-11-25 DIAGNOSIS — Z7902 Long term (current) use of antithrombotics/antiplatelets: Secondary | ICD-10-CM | POA: Diagnosis not present

## 2020-11-25 DIAGNOSIS — I10 Essential (primary) hypertension: Secondary | ICD-10-CM | POA: Diagnosis not present

## 2020-11-25 DIAGNOSIS — E1122 Type 2 diabetes mellitus with diabetic chronic kidney disease: Secondary | ICD-10-CM | POA: Diagnosis not present

## 2020-11-25 DIAGNOSIS — I69351 Hemiplegia and hemiparesis following cerebral infarction affecting right dominant side: Secondary | ICD-10-CM | POA: Diagnosis not present

## 2020-11-30 ENCOUNTER — Other Ambulatory Visit: Payer: Self-pay

## 2020-11-30 ENCOUNTER — Ambulatory Visit (INDEPENDENT_AMBULATORY_CARE_PROVIDER_SITE_OTHER): Payer: Medicare Other | Admitting: Family Medicine

## 2020-11-30 ENCOUNTER — Encounter: Payer: Self-pay | Admitting: Family Medicine

## 2020-11-30 VITALS — BP 126/68 | HR 74 | Temp 97.7°F | Ht 68.0 in | Wt 190.0 lb

## 2020-11-30 DIAGNOSIS — Z9989 Dependence on other enabling machines and devices: Secondary | ICD-10-CM

## 2020-11-30 DIAGNOSIS — Z794 Long term (current) use of insulin: Secondary | ICD-10-CM | POA: Diagnosis not present

## 2020-11-30 DIAGNOSIS — G4733 Obstructive sleep apnea (adult) (pediatric): Secondary | ICD-10-CM

## 2020-11-30 DIAGNOSIS — G2581 Restless legs syndrome: Secondary | ICD-10-CM | POA: Diagnosis not present

## 2020-11-30 DIAGNOSIS — E1140 Type 2 diabetes mellitus with diabetic neuropathy, unspecified: Secondary | ICD-10-CM | POA: Diagnosis not present

## 2020-11-30 DIAGNOSIS — I69351 Hemiplegia and hemiparesis following cerebral infarction affecting right dominant side: Secondary | ICD-10-CM

## 2020-11-30 DIAGNOSIS — IMO0002 Reserved for concepts with insufficient information to code with codable children: Secondary | ICD-10-CM

## 2020-11-30 DIAGNOSIS — G8110 Spastic hemiplegia affecting unspecified side: Secondary | ICD-10-CM

## 2020-11-30 DIAGNOSIS — E1165 Type 2 diabetes mellitus with hyperglycemia: Secondary | ICD-10-CM

## 2020-11-30 DIAGNOSIS — E1142 Type 2 diabetes mellitus with diabetic polyneuropathy: Secondary | ICD-10-CM | POA: Diagnosis not present

## 2020-11-30 DIAGNOSIS — E785 Hyperlipidemia, unspecified: Secondary | ICD-10-CM | POA: Diagnosis not present

## 2020-11-30 DIAGNOSIS — R609 Edema, unspecified: Secondary | ICD-10-CM | POA: Diagnosis not present

## 2020-11-30 DIAGNOSIS — I6930 Unspecified sequelae of cerebral infarction: Secondary | ICD-10-CM | POA: Diagnosis not present

## 2020-11-30 DIAGNOSIS — E039 Hypothyroidism, unspecified: Secondary | ICD-10-CM | POA: Diagnosis not present

## 2020-11-30 MED ORDER — LORAZEPAM 0.5 MG PO TABS: 0.5000 mg | ORAL_TABLET | Freq: Every evening | ORAL | 0 refills | Status: DC | PRN

## 2020-11-30 NOTE — Assessment & Plan Note (Signed)
Appreciate endo care. Upcoming endo appt later today

## 2020-11-30 NOTE — Progress Notes (Signed)
Patient ID: James Spurr, MD, male    DOB: Sep 17, 1931, 87 y.o.   MRN: [id number]  This visit was conducted in person.  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 Has remained off oxycodone, continues mirapex 1mg  daily and gabapentin 600mg  TID - previously recommended limiting gabapentin to 300mg  TID given CKD. He is actually taking 600mg  QID due to uncontrollable leg symptoms. Describes paresthesias that progresses to involuntary movements of feet, worse at night and keeps him awake. Describes akathisia. Can also affect hands.   Has been referred to palliative care outpatient program in residual hemiparesis after stroke.   DM - having trouble managing cbg's. Seeing endo later today.   OSA on CPAP followed by [facility] Cl[facility] Seal).  RLS - continues seeing [facility] (Dr Jeraldine Loots). Having trouble scheduling appt - next scheduled on 07/2021.   S/p feraheme infusion x2 at [facility] 05/2020 Iron panel WNL, ferritin 300s 10/2020     Relevant past medical, surgical, family and social history reviewed and updated as indicated. Interim medical history since our last visit reviewed. Allergies and medications reviewed and updated. Outpatient Medications Prior to Visit  Medication Sig Dispense Refill  . Alpha-Lipoic Acid 600 MG CAPS Take 1 capsule (600 mg total) by mouth daily. 30 capsule 11  . BD PEN NEEDLE NANO 2ND GEN 32G X 4 MM MISC Use to inject medication daily 100 each 0  . celecoxib (CELEBREX) 200 MG capsule Take 1 capsule (200 mg total) by mouth daily. (Patient taking differently: Take 200 mg by mouth daily as needed for mild pain.) 90 capsule 1  . clopidogrel (PLAVIX) 75 MG tablet Take 1 tablet (75 mg  total) by mouth daily. 90 tablet 3  . Coenzyme Q10 (COQ10) 150 MG CAPS Take 1 capsule by mouth 2 (two) times daily.    . COMBIGAN 0.2-0.5 % ophthalmic solution Place 1 drop into the right eye in the morning and at bedtime.    . diclofenac Sodium (VOLTAREN) 1 % GEL Apply 2 g topically 3 (three) times daily as needed. (Patient taking differently: Apply 2 g topically 3 (three) times daily as needed (pain).) 100 g 1  . DULoxetine (CYMBALTA) 60 MG capsule TAKE 1 CAPSULE DAILY 90 capsule 3  . famotidine (PEPCID) 20 MG tablet Take 1 tablet (20 mg total) by mouth at bedtime. (Patient taking differently: Take 20 mg by mouth daily. PRN)    . gabapentin (NEURONTIN) 300 MG capsule Take 600 mg by mouth 3 (three) times daily. 600 mg BID is his minimum dose without having breakthrough symptoms. He is aware 900 mg/day is recommended per kidney function.    . insulin degludec (TRESIBA FLEXTOUCH) 200 UNIT/ML FlexTouch Pen Inject 90 Units into the skin at bedtime. As needed    . insulin lispro (HUMALOG) 100 UNIT/ML KwikPen Inject 4 Units into the skin daily at 12 noon. With largest meal; Sliding Scale prior to meals if needed    . l-methylfolate-B6-B12 (METANX) 3-35-2 MG TABS tablet Take 1 tablet by mouth daily. 30 tablet 0  . levothyroxine (SYNTHROID) 50 MCG tablet Take 1 tablet (  50 mcg total) by mouth daily before breakfast. Per endo [name]    . LUMIGAN 0.01 % SOLN Place 1 drop into the right eye at bedtime.    . melatonin 5 MG TABS Take 5 mg by mouth at bedtime.    . metoprolol tartrate (LOPRESSOR) 25 MG tablet TAKE ONE-HALF (1/2) TABLET (12.5 MG) AT BEDTIME WITH MORNING DOSE IF NEEDED (Patient taking differently: Take 25 mg by mouth at bedtime. PRN palpitations) 90 tablet 3  . nitroGLYCERIN (NITROSTAT) 0.4 MG SL tablet DISSOLVE 1 TABLET UNDER THE TONGUE EVERY 5 MINUTES AS NEEDED FOR CHEST PAIN (Patient taking differently: Place 0.4 mg under the tongue every 5 (five) minutes as needed for chest pain.) 25 tablet 1  .  Omega-3 Fatty Acids (FISH OIL) 1000 MG CAPS Take 1 capsule (1,000 mg total) by mouth in the morning and at bedtime.  0  . ondansetron (ZOFRAN) 4 MG tablet Take 1 tablet (4 mg total) by mouth every 8 (eight) hours as needed for nausea or vomiting. 30 tablet 0  . pramipexole (MIRAPEX) 1 MG tablet Take 1 mg by mouth See admin instructions. At 4pm    . RABEprazole (ACIPHEX) 20 MG tablet TAKE 1 TABLET DAILY 90 tablet 3  . rosuvastatin (CRESTOR) 40 MG tablet Take 1 tablet (40 mg total) by mouth daily. 90 tablet 3  . Semaglutide, 1 MG/DOSE, (OZEMPIC, 1 MG/DOSE,) 2 MG/1.5ML SOPN Inject 1 mg into the skin every Saturday.    . senna-docusate (SENOKOT-S) 8.6-50 MG tablet Take 1 tablet by mouth at bedtime. 30 tablet 0  . tamsulosin (FLOMAX) 0.4 MG CAPS capsule Take 1 capsule (0.4 mg total) by mouth daily after breakfast. (Patient taking differently: Take 0.4 mg by mouth daily after supper.) 30 capsule 0  . valsartan (DIOVAN) 40 MG tablet Take 0.5 tablets (20 mg total) by mouth daily.    . vitamin C (ASCORBIC ACID) 500 MG tablet Take 1,000 mg by mouth daily.      No facility-administered medications prior to visit.     Per HPI unless specifically indicated in ROS section below Review of Systems Objective:  BP 126/68   Pulse 74   Temp 97.7 F (36.5 C) (Temporal)   Ht 5\' 8"  (1.727 m)   Wt 190 lb (86.2 kg)   SpO2 97%   BMI 28.89 kg/m   Wt Readings from Last 3 Encounters:  11/30/20 190 lb (86.2 kg)  11/15/20 188 lb (85.3 kg)  11/10/20 189 lb 6 oz (85.9 kg)      Physical Exam Vitals and nursing note reviewed.  Constitutional:      Appearance: Normal appearance. He is not ill-appearing.     Comments: Ambulates with rolling walker  Cardiovascular:     Rate and Rhythm: Normal rate and regular rhythm.     Pulses: Normal pulses.     Heart sounds: Normal heart sounds. No murmur heard.   Pulmonary:     Effort: Pulmonary effort is normal. No respiratory distress.     Breath sounds: Normal breath  sounds. No wheezing, rhonchi or rales.  Neurological:     Mental Status: He is alert.     Comments: Chronic R hemiparesis  Psychiatric:        Mood and Affect: Mood normal.        Behavior: Behavior normal.       Results for orders placed or performed in visit on 11/10/20  CBC with Differential/Platelet  Result Value Ref Range   WBC 8.0  4.0 - 10.5 K/uL   RBC 4.03 (L) 4.22 - 5.81 Mil/uL   Hemoglobin 12.4 (L) 13.0 - 17.0 g/dL   HCT 36.7 (L) 39.0 - 52.0 %   MCV 91.1 78.0 - 100.0 fl   MCHC 33.9 30.0 - 36.0 g/dL   RDW 12.9 11.5 - 15.5 %   Platelets 184.0 150.0 - 400.0 K/uL   Neutrophils Relative % 65.4 43.0 - 77.0 %   Lymphocytes Relative 22.5 12.0 - 46.0 %   Monocytes Relative 9.6 3.0 - 12.0 %   Eosinophils Relative 2.0 0.0 - 5.0 %   Basophils Relative 0.5 0.0 - 3.0 %   Neutro Abs 5.2 1.4 - 7.7 K/uL   Lymphs Abs 1.8 0.7 - 4.0 K/uL   Monocytes Absolute 0.8 0.1 - 1.0 K/uL   Eosinophils Absolute 0.2 0.0 - 0.7 K/uL   Basophils Absolute 0.0 0.0 - 0.1 K/uL  Renal function panel  Result Value Ref Range   Sodium 137 135 - 145 mEq/L   Potassium 4.4 3.5 - 5.1 mEq/L   Chloride 103 96 - 112 mEq/L   CO2 28 19 - 32 mEq/L   Albumin 3.9 3.5 - 5.2 g/dL   BUN 32 (H) 6 - 23 mg/dL   Creatinine, Ser 1.50 0.40 - 1.50 mg/dL   Glucose, Bld 143 (H) 70 - 99 mg/dL   Phosphorus 4.2 2.3 - 4.6 mg/dL   GFR 41.64 (L) >60.00 mL/min   Calcium 8.9 8.4 - 10.5 mg/dL  Ferritin  Result Value Ref Range   Ferritin 321.6 22.0 - 322.0 ng/mL  IBC panel  Result Value Ref Range   Iron 90 42 - 165 ug/dL   Transferrin 222.0 212.0 - 360.0 mg/dL   Saturation Ratios 29.0 20.0 - 50.0 %   Assessment & Plan:  This visit occurred during the SARS-CoV-2 public health emergency.  Safety protocols were in place, including screening questions prior to the visit, additional usage of staff PPE, and extensive cleaning of exam room while observing appropriate contact time as indicated for disinfecting solutions.   Problem List  Items Addressed This Visit    Hemiparesis affecting right side as late effect of stroke ([place])   Type 2 diabetes mellitus, uncontrolled, with neuropathy ([place])    Appreciate endo care. Upcoming endo appt later today       OSA on CPAP    Recently saw neurology ([facility])      Restless leg syndrome - Primary    Difficult situation - ongoing severe RLS symptoms now off oxycodone. Describes akathisia with paresthesias more than leg pain or involuntary leg movements. Still consistent with RLS but would want neuro input to r/o other neurologic process.  Discussed concern with high dose gabapentin - recommend limit dose to 300mg  QID. He also continues cymbalta 60mg  daily and mirapex 1mg  daily. Will trial low dose lorazepam 0.5mg  nightly PRN to see if we can decrease gabapentin to 300mg  QID.  Will see if we can expedite [facility] neuro appt.       Relevant Orders   Ambulatory referral to Neurology   Spastic hemiparesis affecting dominant side ([place])       Meds ordered this encounter  Medications  . LORazepam (ATIVAN) 0.5 MG tablet    Sig: Take 1 tablet (0.5 mg total) by mouth at bedtime as needed for sleep.    Dispense:  30 tablet    Refill:  0   Orders Placed This Encounter  Procedures  . Ambulatory referral to Neurology  Referral Priority:   Routine    Referral Type:   Consultation    Referral Reason:   Specialty Services Required    Requested Specialty:   Neurology    Number of Visits Requested:   1    Patient Instructions  I wonder if leg symptoms are more than just restless legs. Trial lorazepam 1 pill at night to see if it will help you sleep.  Drop gabapentin dose to 300mg  1 tablet up to 4 times a day. Keep me updated with how you're doing. Call Dr Jeraldine Loots for sooner appointment - I will also see if we can help with that.  Return in 3 months for follow up visit.   Follow up plan: Return in about 3 months (around 03/02/2021), or if symptoms worsen or fail to improve.  [name], MD

## 2020-11-30 NOTE — Assessment & Plan Note (Signed)
Recently saw neurology ([facility])

## 2020-11-30 NOTE — Patient Instructions (Signed)
I wonder if leg symptoms are more than just restless legs. Trial lorazepam 1 pill at night to see if it will help you sleep.  Drop gabapentin dose to 300mg  1 tablet up to 4 times a day. Keep me updated with how you're doing. Call Dr Jeraldine Loots for sooner appointment - I will also see if we can help with that.  Return in 3 months for follow up visit.

## 2020-11-30 NOTE — Assessment & Plan Note (Addendum)
Difficult situation - ongoing severe RLS symptoms now off oxycodone. Describes akathisia with paresthesias more than leg pain or involuntary leg movements. Still consistent with RLS but would want neuro input to r/o other neurologic process.  Discussed concern with high dose gabapentin - recommend limit dose to 300mg  QID. He also continues cymbalta 60mg  daily and mirapex 1mg  daily. Will trial low dose lorazepam 0.5mg  nightly PRN to see if we can decrease gabapentin to 300mg  QID.  Will see if we can expedite [facility] neuro appt.

## 2020-12-01 DIAGNOSIS — K219 Gastro-esophageal reflux disease without esophagitis: Secondary | ICD-10-CM | POA: Diagnosis not present

## 2020-12-01 DIAGNOSIS — Z7902 Long term (current) use of antithrombotics/antiplatelets: Secondary | ICD-10-CM | POA: Diagnosis not present

## 2020-12-01 DIAGNOSIS — N183 Chronic kidney disease, stage 3 unspecified: Secondary | ICD-10-CM | POA: Diagnosis not present

## 2020-12-01 DIAGNOSIS — G9340 Encephalopathy, unspecified: Secondary | ICD-10-CM | POA: Diagnosis not present

## 2020-12-01 DIAGNOSIS — I10 Essential (primary) hypertension: Secondary | ICD-10-CM | POA: Diagnosis not present

## 2020-12-01 DIAGNOSIS — E1122 Type 2 diabetes mellitus with diabetic chronic kidney disease: Secondary | ICD-10-CM | POA: Diagnosis not present

## 2020-12-01 DIAGNOSIS — Z794 Long term (current) use of insulin: Secondary | ICD-10-CM | POA: Diagnosis not present

## 2020-12-01 DIAGNOSIS — I69351 Hemiplegia and hemiparesis following cerebral infarction affecting right dominant side: Secondary | ICD-10-CM | POA: Diagnosis not present

## 2020-12-02 DIAGNOSIS — H903 Sensorineural hearing loss, bilateral: Secondary | ICD-10-CM | POA: Diagnosis not present

## 2020-12-02 DIAGNOSIS — J310 Chronic rhinitis: Secondary | ICD-10-CM | POA: Diagnosis not present

## 2020-12-02 DIAGNOSIS — H6121 Impacted cerumen, right ear: Secondary | ICD-10-CM | POA: Diagnosis not present

## 2020-12-02 DIAGNOSIS — H6061 Unspecified chronic otitis externa, right ear: Secondary | ICD-10-CM | POA: Diagnosis not present

## 2020-12-03 DIAGNOSIS — E1122 Type 2 diabetes mellitus with diabetic chronic kidney disease: Secondary | ICD-10-CM | POA: Diagnosis not present

## 2020-12-03 DIAGNOSIS — N183 Chronic kidney disease, stage 3 unspecified: Secondary | ICD-10-CM | POA: Diagnosis not present

## 2020-12-03 DIAGNOSIS — I69351 Hemiplegia and hemiparesis following cerebral infarction affecting right dominant side: Secondary | ICD-10-CM | POA: Diagnosis not present

## 2020-12-03 DIAGNOSIS — Z794 Long term (current) use of insulin: Secondary | ICD-10-CM | POA: Diagnosis not present

## 2020-12-03 DIAGNOSIS — I10 Essential (primary) hypertension: Secondary | ICD-10-CM | POA: Diagnosis not present

## 2020-12-03 DIAGNOSIS — Z7902 Long term (current) use of antithrombotics/antiplatelets: Secondary | ICD-10-CM | POA: Diagnosis not present

## 2020-12-03 DIAGNOSIS — K219 Gastro-esophageal reflux disease without esophagitis: Secondary | ICD-10-CM | POA: Diagnosis not present

## 2020-12-03 DIAGNOSIS — G9340 Encephalopathy, unspecified: Secondary | ICD-10-CM | POA: Diagnosis not present

## 2020-12-06 DIAGNOSIS — G8110 Spastic hemiplegia affecting unspecified side: Secondary | ICD-10-CM | POA: Diagnosis not present

## 2020-12-06 DIAGNOSIS — I69351 Hemiplegia and hemiparesis following cerebral infarction affecting right dominant side: Secondary | ICD-10-CM | POA: Diagnosis not present

## 2020-12-06 DIAGNOSIS — E1140 Type 2 diabetes mellitus with diabetic neuropathy, unspecified: Secondary | ICD-10-CM | POA: Diagnosis not present

## 2020-12-06 DIAGNOSIS — R269 Unspecified abnormalities of gait and mobility: Secondary | ICD-10-CM | POA: Diagnosis not present

## 2020-12-06 DIAGNOSIS — G2581 Restless legs syndrome: Secondary | ICD-10-CM | POA: Diagnosis not present

## 2020-12-08 DIAGNOSIS — Z7902 Long term (current) use of antithrombotics/antiplatelets: Secondary | ICD-10-CM | POA: Diagnosis not present

## 2020-12-08 DIAGNOSIS — N183 Chronic kidney disease, stage 3 unspecified: Secondary | ICD-10-CM | POA: Diagnosis not present

## 2020-12-08 DIAGNOSIS — G9340 Encephalopathy, unspecified: Secondary | ICD-10-CM | POA: Diagnosis not present

## 2020-12-08 DIAGNOSIS — E1122 Type 2 diabetes mellitus with diabetic chronic kidney disease: Secondary | ICD-10-CM | POA: Diagnosis not present

## 2020-12-08 DIAGNOSIS — I10 Essential (primary) hypertension: Secondary | ICD-10-CM | POA: Diagnosis not present

## 2020-12-08 DIAGNOSIS — K219 Gastro-esophageal reflux disease without esophagitis: Secondary | ICD-10-CM | POA: Diagnosis not present

## 2020-12-08 DIAGNOSIS — Z794 Long term (current) use of insulin: Secondary | ICD-10-CM | POA: Diagnosis not present

## 2020-12-08 DIAGNOSIS — I69351 Hemiplegia and hemiparesis following cerebral infarction affecting right dominant side: Secondary | ICD-10-CM | POA: Diagnosis not present

## 2020-12-08 NOTE — Patient Instructions (Signed)
Dear [name], MD,  It was a pleasure meeting you during our initial appointment on November 18, 2020. Below is a summary of the goals we discussed and components of chronic care management. Please contact me anytime with questions or concerns.   Visit Information  Patient Care Plan: CCM Pharmacy Care Plan    Problem Identified: CHL AMB "PATIENT-SPECIFIC PROBLEM"     Long-Range Goal: Disease Management   Start Date: 11/18/2020  Priority: High  Note:    Current Barriers:  . Unable to achieve control of restless leg syndrome   Pharmacist Clinical Goal(s):  Marland Kitchen Over the next 90 days, patient will contact provider office for questions/concerns as evidenced notation of same in electronic health record through collaboration with PharmD and provider.   Interventions: . 1:1 collaboration with [name], MD regarding development and update of comprehensive plan of care as evidenced by provider attestation and co-signature . Inter-disciplinary care team collaboration (see longitudinal plan of care) . Comprehensive medication review performed; medication list updated in electronic medical record  Hypertension (BP goal <140/90) Query-Controlled -Current treatment: . None - losartan on hold as of 2/15 due to hospitalization dehydration/AKI, but patient unaware and was still taking as of today  -Medications previously tried: Lisinopril - tachycardia  -Current home readings: none to report, reports BP has always been normal/low end  -Last UACR < 30, 2014 -Denies hypotensive/hypertensive symptoms -Educated on Importance of home blood pressure monitoring; -Counseled to monitor BP at home daily, document, and provide log at future appointments -Recommended hold losartan (per PCP visit 11/10/20) and monitor BP daily for next 2 weeks. Will call pt for BP update.  Hyperlipidemia: (LDL goal < 70) -Controlled -Current treatment: . Rosuvastatin 40 mg - 1 tablet daily  -Medications  previously tried: pt reports multiple prior failed therapies -Educated on Benefits of statin for ASCVD risk reduction; -Recommended to continue current medication  Diabetes (A1c goal <8%  ADA - age/comorbidities) -Controlled -Current medications: . Ozempic 1 mg - Inject once weekly . Tresiba - Inject 90 units at bedtime  . Humalog (Insulin Lispro) Inject 4 units prior to lunchtime meal (taking 5-15 units pre-meal PRN if eating large meal) -Medications previously tried: Janumet, glyburide - pt reports upset stomach -Adherence - reports taking Humalog sliding scale (5-15 units, usually 15 units) prior to lunch. Confirms 90 units Tresiba and 1 mg Ozempic weekly. -Discussed BG goals 80-150 in the morning and < 200 2 hours after meals, for an A1c goal < 8% -Current home glucose readings - using Freestyle Libre . Post prandial glucose: reports lot of fluctuations, several above 200. Twice over 300 this month. . Denies any hypoglycemia -Denies hypoglycemic/hyperglycemic symptoms -Diet - reports he loves bread and is a big breakfast eater. With Freestyle he can see how this impacts his BG and tries to limit. -Educated onA1c and blood sugar goals; Prevention and management of hypoglycemic episodes; -Counseled to check feet daily and get yearly eye exams -Recommended to continue current medication; Follow up with Dr. [name] as planned.  RLS (Goal: Improve symptoms) -Uncontrolled -Current treatment  . Gabapentin 300 mg - 2 caps TID . Cymbalta 60 mg - 1 capsule daily . Pramipexole 1 mg - 1 tablet daily at supper time . Alpha lipoic acid 600 mg - 1 daily  -Medications previously tried: oxycodone - delirium, has reduced gabapentin from 1200 mg TID to current dose due to adverse effects/CKD. -Pt aware max dose recommended gabapentin is 900 mg/day per renal function. Reports unbearable pain  on less than 1800 mg/day. He may take 1 additional tablet PRN breakthrough pain as well. He has follow up with Dr.  Jeraldine Loots soon to discuss treatment options. Pt inquired about long acting Mirapex. -Recommended to continue current medication; Send patient info on long acting Mirapex.   Patient Goals/Self-Care Activities . Over the next 90 days, patient will:  - check glucose daily, document, and provide at future appointments  Follow Up Plan: Telephone follow up appointment with care management team member scheduled for: 6 months; CMA call for BP review in 4 weeks.      Mr. Dettman was given information about Chronic Care Management services today including:  1. CCM service includes personalized support from designated clinical staff supervised by his physician, including individualized plan of care and coordination with other care providers 2. 24/7 contact phone numbers for assistance for urgent and routine care needs. 3. Standard insurance, coinsurance, copays and deductibles apply for chronic care management only during months in which we provide at least 20 minutes of these services. Most insurances cover these services at 100%, however patients may be responsible for any copay, coinsurance and/or deductible if applicable. This service may help you avoid the need for more expensive face-to-face services. 4. Only one practitioner may furnish and bill the service in a calendar month. 5. The patient may stop CCM services at any time (effective at the end of the month) by phone call to the office staff.  Patient agreed to services and verbal consent obtained.   The patient verbalized understanding of instructions, educational materials, and care plan provided today and agreed to receive a mailed copy of patient instructions, educational materials, and care plan.  The pharmacy team will reach out to the patient again over the next 45 days.   [name], PharmD Clinical Pharmacist [facility] at [facility] [phone]   Restless Legs Syndrome Restless legs syndrome is a condition that  causes uncomfortable feelings or sensations in the legs, especially while sitting or lying down. The sensations usually cause an overwhelming urge to move the legs. The arms can also sometimes be affected. The condition can range from mild to severe. The symptoms often interfere with a person's ability to sleep. What are the causes? The cause of this condition is not known. What increases the risk? The following factors may make you more likely to develop this condition:  Being older than 50.  Pregnancy.  Being a woman. In general, the condition is more common in women than in men.  A family history of the condition.  Having iron deficiency.  Overuse of caffeine, nicotine, or alcohol.  Certain medical conditions, such as kidney disease, Parkinson's disease, or nerve damage.  Certain medicines, such as those for high blood pressure, nausea, colds, allergies, depression, and some heart conditions. What are the signs or symptoms? The main symptom of this condition is uncomfortable sensations in the legs, such as:  Pulling.  Tingling.  Prickling.  Throbbing.  Crawling.  Burning. Usually, the sensations:  Affect both sides of the body.  Are worse when you sit or lie down.  Are worse at night. These may wake you up or make it difficult to fall asleep.  Make you have a strong urge to move your legs.  Are temporarily relieved by moving your legs. The arms can also be affected, but this is rare. People who have this condition often have tiredness during the day because of their lack of sleep at night. How is this  diagnosed? This condition may be diagnosed based on:  Your symptoms.  Blood tests. In some cases, you may be monitored in a sleep lab by a specialist (a sleep study). This can detect any disruptions in your sleep. How is this treated? This condition is treated by managing the symptoms. This may include:  Lifestyle changes, such as exercising, using relaxation  techniques, and avoiding caffeine, alcohol, or tobacco.  Medicines. Anti-seizure medicines may be tried first. Follow these instructions at home: General instructions  Take over-the-counter and prescription medicines only as told by your health care provider.  Use methods to help relieve the uncomfortable sensations, such as: ? Massaging your legs. ? Walking or stretching. ? Taking a cold or hot bath.  Keep all follow-up visits as told by your health care provider. This is important. Lifestyle  Practice good sleep habits. For example, go to bed and get up at the same time every day. Most adults should get 7-9 hours of sleep each night.  Exercise regularly. Try to get at least 30 minutes of exercise most days of the week.  Practice ways of relaxing, such as yoga or meditation.  Avoid caffeine and alcohol.  Do not use any products that contain nicotine or tobacco, such as cigarettes and e-cigarettes. If you need help quitting, ask your health care provider.      Contact a health care provider if:  Your symptoms get worse or they do not improve with treatment. Summary  Restless legs syndrome is a condition that causes uncomfortable feelings or sensations in the legs, especially while sitting or lying down.  The symptoms often interfere with a person's ability to sleep.  This condition is treated by managing the symptoms. You may need to make lifestyle changes or take medicines. This information is not intended to replace advice given to you by your health care provider. Make sure you discuss any questions you have with your health care provider. Document Revised: 10/24/2019 Document Reviewed: 09/24/2017 Elsevier Patient Education  2021 [employer].

## 2020-12-08 NOTE — Progress Notes (Signed)
Encounter details: CCM Time Spent       Value Time User   Time spent with patient (minutes)  210 12/08/2020  1:00 PM [name], [facility]   Time spent performing Chart review  30 12/08/2020  1:00 PM [name], [facility]   Total time (minutes)  240 12/08/2020  1:00 PM [name], RPH      Moderate to High Complex Decision Making       Value Time User   Moderate to High complex decision making  Yes 12/08/2020 12:32 PM [name], [facility]      CCM Services: This encounter meets complex CCM services and moderate to high decision making.  Prior to outreach and patient consent for Chronic Care Management, I referred this patient for services after reviewing the nominated patient list or from a personal encounter with the patient.  I have personally reviewed this encounter including the documentation in this note and have collaborated with the care management provider regarding care management and care coordination activities to include development and update of the comprehensive care plan. I am certifying that I agree with the content of this note and encounter as supervising physician.

## 2020-12-13 ENCOUNTER — Telehealth: Payer: Self-pay | Admitting: Nurse Practitioner

## 2020-12-13 NOTE — Telephone Encounter (Signed)
Returned call to patient to let him know that our Palliative RN and SW could see him this Thurs. 12/16/20 @ 10 or 11 AM and he requested to be seen at 10 AM.  I have notified our Palliative Team of the appointment.

## 2020-12-13 NOTE — Telephone Encounter (Signed)
Spoke with patient regarding Palliative referral and he was in agreement with scheduling a visit.l  He said that his main concern right now is that he is looking at moving to a place where he has more support/services for himself, he lives alone.  I offered a telehealth visit with NP but he preferred an in-home visit, I told him that the next available in-home appointment with NP was 01/04/21.  I told patient that I would reach out to our RN and SW to see if they could possibly see him sooner and that I would get back with him after I spoke with them and he was in agreement with this.

## 2020-12-15 DIAGNOSIS — K219 Gastro-esophageal reflux disease without esophagitis: Secondary | ICD-10-CM | POA: Diagnosis not present

## 2020-12-15 DIAGNOSIS — Z794 Long term (current) use of insulin: Secondary | ICD-10-CM | POA: Diagnosis not present

## 2020-12-15 DIAGNOSIS — I69351 Hemiplegia and hemiparesis following cerebral infarction affecting right dominant side: Secondary | ICD-10-CM | POA: Diagnosis not present

## 2020-12-15 DIAGNOSIS — E1122 Type 2 diabetes mellitus with diabetic chronic kidney disease: Secondary | ICD-10-CM | POA: Diagnosis not present

## 2020-12-15 DIAGNOSIS — I10 Essential (primary) hypertension: Secondary | ICD-10-CM | POA: Diagnosis not present

## 2020-12-15 DIAGNOSIS — N183 Chronic kidney disease, stage 3 unspecified: Secondary | ICD-10-CM | POA: Diagnosis not present

## 2020-12-15 DIAGNOSIS — G9340 Encephalopathy, unspecified: Secondary | ICD-10-CM | POA: Diagnosis not present

## 2020-12-15 DIAGNOSIS — Z7902 Long term (current) use of antithrombotics/antiplatelets: Secondary | ICD-10-CM | POA: Diagnosis not present

## 2020-12-16 ENCOUNTER — Other Ambulatory Visit: Payer: Self-pay

## 2020-12-16 ENCOUNTER — Other Ambulatory Visit: Payer: Medicare Other

## 2020-12-16 VITALS — BP 140/88 | HR 77 | Temp 97.7°F

## 2020-12-16 DIAGNOSIS — Z515 Encounter for palliative care: Secondary | ICD-10-CM

## 2020-12-16 NOTE — Progress Notes (Signed)
COMMUNITY PALLIATIVE CARE SW NOTE  PATIENT NAME: James Spurr, MD DOB: [birth year] MRN: [id number]  PRIMARY CARE PROVIDER: [name], MD  RESPONSIBLE PARTY:  Acct ID - Guarantor Home Phone Work Phone Relationship Acct Type  [id number] - Encinas,MAR* [phone]  Self P/F     [address], [place], [place] [address]     PLAN OF CARE and INTERVENTIONS:             1. GOALS OF CARE/ ADVANCE CARE PLANNING:  Patient has living will in place. Patient shared that he is a DNR, but form is not present at this time. MOST form discussed with patient as well. Patients goal is to transition to an independent living setting.   SOCIAL/EMOTIONAL/SPIRITUAL ASSESSMENT/ INTERVENTIONS:  SW and RN met with patient for an initial visit to introduce palliative care services, on behalf of assigned palliative care NP.Marland Kitchen Patient lives independently in a one story home/condo. Patient has a caregiver visit 3x/wk to do light house keeping.          Patient suffers from different medical conditions but his main concern is long term planning and placement. No recent falls. Patient eats well. Patient shares that his sleeping has been off lately due to worrying.        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 Patient is very keen on his health and planning for his needs. Patient does all of his ADL's besides some light housekeeping. Patient is mobile via an Transport planner. Patient still drives.         Patient states that he is interested in independent living communities in the [employer] area. SW assisted with outreaching [place] independent living communities for additional information and wait list information. These communities will outreach patient with more information via mail and phone call. Patient appreciative of assistance.  RN checked vitals and reviewed. Patient currently [facility] medications  [facility] and sets up his own pill box monthly.  SW discussed care and goals. Patient and wife and no other concerns or needs of palliative care team at this time. Patient is doing well overall. Palliative care will continue to follow.  2. PATIENT/CAREGIVER EDUCATION/ COPING:  Patient is alert and oriented x4. PHQ-9 administered in normal discussion form. Patient scored 0, indicating no depressive pr anxiety symptoms. Patient enjoys reading. Patient has 2 daughters and a nephew that are supportive and live locally.  3. PERSONAL EMERGENCY PLAN:  Patient to call 9-1-1 for emergencies.  4. COMMUNITY RESOURCES COORDINATION/ HEALTH CARE NAVIGATION:  Patient manages own medical needs. Has a Chartered certified accountant 2-3x a week.   5. FINANCIAL/LEGAL CONCERNS/INTERVENTIONS:  None. Manages his own finances.     SOCIAL HX:  Social History   Tobacco Use  . Smoking status: Never Smoker  . Smokeless tobacco: Never Used  . Tobacco comment: "used to smoke pipe when I was younger"  Substance Use Topics  . Alcohol use: Yes    Alcohol/week: 4.0 standard drinks    Types: 2 Glasses of wine, 2 Shots of liquor per week    Comment: occasional    CODE STATUS: Full code. But wishes to be DNR. Form left with patient.  ADVANCED DIRECTIVES: Y. Living will. MOST FORM COMPLETE: TBD. Form left with patient. HOSPICE EDUCATION PROVIDED: No  PPS: Patient is independent with ADL's. Patient cooks own meals. Patient Is mobile with motorized chair. Patient still drives.   Time  spent: 1hr 30min.      Doreene Eland, [place]

## 2020-12-16 NOTE — Progress Notes (Signed)
PATIENT NAME: James Spurr, MD DOB: Oct 22, 1930 MRN: [id number]  PRIMARY CARE PROVIDER: [name], MD  RESPONSIBLE PARTY:  Acct ID - Guarantor Home Phone Work Phone Relationship Acct Type  [id number] - Nepomuceno,MAR* [phone]  Self P/F     [address], [place], [place] [address]    PLAN OF CARE and INTERVENTIONS:               1.  GOALS OF CARE/ ADVANCE CARE PLANNING:  Long term care planning and Advance Directives.               2.  PATIENT/CAREGIVER EDUCATION:  Reviewed the MOST form and Code Status.  Discussed CCR[facility] vs Assisted Living.  Palliative Care Services.               4. PERSONAL EMERGENCY PLAN:  Activate 911 for emergencies.               5.  DISEASE STATUS:  Joint visit completed with James Bell, SW and patient.  Patient is using a motorized scooter and is able to complete most ADL's independently. Right sided hemiparesis present.   He currently has a private caregiver coming into the home 3 days a week to offer assistance with household chores.  Patient denies any issues with pain.  He is currently taking Gabapentin to address the RLS but notes this is also affecting his kidney function.  He does have occasionally issues with nausea due to the use of Gabapentin.  Patient is currently using Zofran to address this symptom and notes it is effective.  He denies any other medical concerns at this time.  Discussion completed on Palliative Care services.  The sole purpose of today's visit is to assist patient with long term planning as he feels a higher level of care will soon be needed.  Patient has not begun the process of exploring facilities and options and is requesting assistance with this today.  He desires to remain in this area as he has a nephew in [place] that visits him often, a daughter in [place] and another daughter in the [place] area.  Discussed [facility] facilities vs a free-standing assisted living facility.  James Bell, SW was able to reach representatives  from [facility] during this visit.  Both facilities with either mail or e-mail information to [name] and follow up with a phone call in the coming days.  Patient is asking about a bubble pack for his medications.  He is currently getting medications through [facility] but they do not do bubble packs.  Patient states there are frequent changes to the brand of medication he is using which can cause some confusion.  Patient has a spread sheet with all of his medications listed and also the pill is glued next to the name of the medication.  He is using this as a tool to complete monthly medication baggies.  He is changing the pills on the spread sheet when they change but is becoming weary of doing this.   Patient is currently pleased with using [facility] and does not necessarily have a desire to change companies.  I have discussed some local options of pharmacies that will deliver and do bubble packs.  I have also discussed aides and/or nurses in facilities who administer medications to patients depending Consuello Bossier is currently doing a 1 month supply of pre-filled baggies with his medications. He is doing this independently.   He notes the challenges  of doing this as [facility] will n which level of care they are at.  At this time, patient will continue with his monthly baggies as he feels there will be a decision made about placement in the coming month.  Patient has a Living Will in place.  He confirms his desire for CPR-Full Code at this time but would not want aggressive measures done if his prognosis is poor.  We have discussed the MOST form at length.  A copy of this has been left with patient for his review.  He will make note of any questions he may have prior to our next visit.  Explained this form would need to be signed by either his PCP or the Palliative Care NP and would be added to his chart.  Patient voiced understanding will review this prior to our next  visit.    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  He enjoyed sharing about his practices in 4 locations on the [name]. Active listening provided as patient discussed the challenges of living in his current home and likely need for more assistance sooner.  He has spoken with his PCP who supports his decision to look for a higher level of care.  Patient believes once he reviews the material from the facilities that have been contacted, he will be able to make a decision by the time we meet again.  Visit scheduled for next month on April 26th @ 10 am.  Information on [facility] and contact information provided to patient should he need to contact us prior to our next visit.     HISTORY OF PRESENT ILLNESS:  87 year old male with a hx of CVA.  Patient is being followed by Palliative Care monthly and PRN  CODE STATUS: Full ADVANCED DIRECTIVES: Yes MOST FORM: No PPS: 50%   PHYSICAL EXAM:   VITALS: Today's Vitals   12/16/20 1020  BP: 140/88  Pulse: 77  Temp: 97.7 F (36.5 C)  SpO2: 96%  PainSc: 0-No pain        Lorenza Burton, RN

## 2020-12-17 DIAGNOSIS — H401132 Primary open-angle glaucoma, bilateral, moderate stage: Secondary | ICD-10-CM | POA: Diagnosis not present

## 2020-12-20 DIAGNOSIS — G9340 Encephalopathy, unspecified: Secondary | ICD-10-CM | POA: Diagnosis not present

## 2020-12-20 DIAGNOSIS — E1122 Type 2 diabetes mellitus with diabetic chronic kidney disease: Secondary | ICD-10-CM | POA: Diagnosis not present

## 2020-12-20 DIAGNOSIS — N183 Chronic kidney disease, stage 3 unspecified: Secondary | ICD-10-CM | POA: Diagnosis not present

## 2020-12-20 DIAGNOSIS — Z7902 Long term (current) use of antithrombotics/antiplatelets: Secondary | ICD-10-CM | POA: Diagnosis not present

## 2020-12-20 DIAGNOSIS — I69351 Hemiplegia and hemiparesis following cerebral infarction affecting right dominant side: Secondary | ICD-10-CM | POA: Diagnosis not present

## 2020-12-20 DIAGNOSIS — Z794 Long term (current) use of insulin: Secondary | ICD-10-CM | POA: Diagnosis not present

## 2020-12-20 DIAGNOSIS — K219 Gastro-esophageal reflux disease without esophagitis: Secondary | ICD-10-CM | POA: Diagnosis not present

## 2020-12-20 DIAGNOSIS — I10 Essential (primary) hypertension: Secondary | ICD-10-CM | POA: Diagnosis not present

## 2020-12-21 DIAGNOSIS — H903 Sensorineural hearing loss, bilateral: Secondary | ICD-10-CM | POA: Diagnosis not present

## 2020-12-21 DIAGNOSIS — H6063 Unspecified chronic otitis externa, bilateral: Secondary | ICD-10-CM | POA: Diagnosis not present

## 2020-12-23 ENCOUNTER — Other Ambulatory Visit: Payer: Self-pay | Admitting: Family Medicine

## 2020-12-23 NOTE — Telephone Encounter (Signed)
New Rx sent in

## 2020-12-23 NOTE — Telephone Encounter (Signed)
Per 11/10/20 Lab Result Note, pt was told he could restart 20 mg.  Do you want to send new rx?  Valsartan 40 mg Last filled:  10/02/20 Last OV:  11/30/20, 3 mo f/u Next OV:  03/08/21, 3 mo f/u

## 2020-12-27 ENCOUNTER — Telehealth: Payer: Self-pay | Admitting: Family Medicine

## 2020-12-27 DIAGNOSIS — I69398 Other sequelae of cerebral infarction: Secondary | ICD-10-CM

## 2020-12-27 DIAGNOSIS — K219 Gastro-esophageal reflux disease without esophagitis: Secondary | ICD-10-CM | POA: Diagnosis not present

## 2020-12-27 DIAGNOSIS — I10 Essential (primary) hypertension: Secondary | ICD-10-CM | POA: Diagnosis not present

## 2020-12-27 DIAGNOSIS — E1122 Type 2 diabetes mellitus with diabetic chronic kidney disease: Secondary | ICD-10-CM | POA: Diagnosis not present

## 2020-12-27 DIAGNOSIS — Z7902 Long term (current) use of antithrombotics/antiplatelets: Secondary | ICD-10-CM | POA: Diagnosis not present

## 2020-12-27 DIAGNOSIS — N183 Chronic kidney disease, stage 3 unspecified: Secondary | ICD-10-CM | POA: Diagnosis not present

## 2020-12-27 DIAGNOSIS — G8110 Spastic hemiplegia affecting unspecified side: Secondary | ICD-10-CM

## 2020-12-27 DIAGNOSIS — Z8673 Personal history of transient ischemic attack (TIA), and cerebral infarction without residual deficits: Secondary | ICD-10-CM

## 2020-12-27 DIAGNOSIS — I69351 Hemiplegia and hemiparesis following cerebral infarction affecting right dominant side: Secondary | ICD-10-CM

## 2020-12-27 DIAGNOSIS — G9340 Encephalopathy, unspecified: Secondary | ICD-10-CM | POA: Diagnosis not present

## 2020-12-27 DIAGNOSIS — R269 Unspecified abnormalities of gait and mobility: Secondary | ICD-10-CM

## 2020-12-27 DIAGNOSIS — Z794 Long term (current) use of insulin: Secondary | ICD-10-CM | POA: Diagnosis not present

## 2020-12-27 NOTE — Telephone Encounter (Signed)
Error

## 2020-12-27 NOTE — Addendum Note (Signed)
Addended by: [name] on: 12/27/2020 05:10 PM   Modules accepted: Orders

## 2020-12-27 NOTE — Telephone Encounter (Signed)
Outpatient PT referral placed.

## 2020-12-27 NOTE — Telephone Encounter (Signed)
Home Health verbal orders-caller/Agency: [facility]/ [place] number: [phone]  Requesting OT/PT/Skilled nursing/Social Work/Speech: PT  Reason: Patient is being discharged from in home PT and is wanting to do more PT in an outpatient Office at [facility]. Please fax the order to them for new orders  Please send referral   Frequency: N/a

## 2020-12-27 NOTE — Telephone Encounter (Signed)
Pt called requesting to speak with Sharyn Lull, pharmacist. He states he has questions regarding some medication that he is wanting to discuss.

## 2020-12-28 NOTE — Telephone Encounter (Signed)
Contacted patient on 12/28/20 at 2 PM. Pt was in a nursing visit and requested a call back today around 4:30 PM. Would like to discuss pill packaging.  [name], PharmD Clinical Pharmacist [facility] [phone]

## 2020-12-28 NOTE — Telephone Encounter (Signed)
Spoke with James Bell informing her Dr. Darnell Level placed requested referral.  Expresses her thanks for letting her know.

## 2020-12-30 ENCOUNTER — Other Ambulatory Visit: Payer: Self-pay

## 2020-12-30 ENCOUNTER — Telehealth: Payer: Self-pay

## 2020-12-30 ENCOUNTER — Ambulatory Visit (INDEPENDENT_AMBULATORY_CARE_PROVIDER_SITE_OTHER): Payer: Medicare Other

## 2020-12-30 DIAGNOSIS — E1169 Type 2 diabetes mellitus with other specified complication: Secondary | ICD-10-CM | POA: Diagnosis not present

## 2020-12-30 DIAGNOSIS — IMO0002 Reserved for concepts with insufficient information to code with codable children: Secondary | ICD-10-CM

## 2020-12-30 DIAGNOSIS — E1165 Type 2 diabetes mellitus with hyperglycemia: Secondary | ICD-10-CM | POA: Diagnosis not present

## 2020-12-30 DIAGNOSIS — G2581 Restless legs syndrome: Secondary | ICD-10-CM

## 2020-12-30 DIAGNOSIS — E785 Hyperlipidemia, unspecified: Secondary | ICD-10-CM

## 2020-12-30 DIAGNOSIS — E1140 Type 2 diabetes mellitus with diabetic neuropathy, unspecified: Secondary | ICD-10-CM | POA: Diagnosis not present

## 2020-12-30 MED ORDER — PRAMIPEXOLE DIHYDROCHLORIDE 1 MG PO TABS: 1.0000 mg | ORAL_TABLET | ORAL | 1 refills | Status: DC

## 2020-12-30 MED ORDER — CLOPIDOGREL BISULFATE 75 MG PO TABS: 75.0000 mg | ORAL_TABLET | Freq: Every day | ORAL | 3 refills | Status: DC

## 2020-12-30 MED ORDER — TAMSULOSIN HCL 0.4 MG PO CAPS: 0.4000 mg | ORAL_CAPSULE | Freq: Every day | ORAL | 1 refills | Status: DC

## 2020-12-30 NOTE — Progress Notes (Signed)
Chronic Care Management Pharmacy Note  12/31/2020 Name:  James FRANZEL, James Bell MRN:  [id number] DOB:  08-28-1931  Subjective: James Spurr, James Bell is an 87 y.o. year old male who is a primary patient of [name], James Bell.  The CCM team was consulted for assistance with disease management and care coordination needs.    Engaged with patient by telephone for follow up visit in response to provider referral for pharmacy case management and/or care coordination services. Patient contacted CCM pharmacist to discuss adherence packaging/delivery services.  Consent to Services:  The patient was given information about Chronic Care Management services, agreed to services, and gave verbal consent prior to initiation of services.  Please see initial visit note for detailed documentation.   Patient Care Team: [name], James Bell as PCP - General (Family Medicine) Delrae Rend, James Bell as Consulting Physician (Endocrinology) Dingeldein, Remo Lipps, James Bell as Consulting Physician (Ophthalmology) Pieter Partridge, DO as Consulting Physician (Neurology) Letta Pate Luanna Salk, James Bell as Consulting Physician (Physical Medicine and Rehabilitation) [name], [facility] as Pharmacist (Pharmacist)  Recent office visits: 11/30/20- PCP - Start lorazepam 0.5 mg at bedtime PRN for RLS. Drop gabapentin dose to 387m 1 tablet up to 4 times a day. 11/15/20 - Lab result notes - CKD, Limit gabapentin to 9030mday. Monitor blood pressures, if they start trending up, ok to restart valsartan 2071mf needed.  11/10/20 - PCP - Recent hospitalization with AMS thought to be due to oxycodone. Likely dehydration which led to AKI + oxycodone use. He has decided to stop oxycodone, which has been added to intolerance list per his request. Chief concern is RLS. Continues mirapex 1mg41mily as well as gabapentin 600mg30m - discussed may need to decrease dose based on kidney function. ARB currently on hold, bp stable today - check renal panel.  Flomax started in the hospital - he doesn't notice significant change in urination. Discussed PM dosing.    Recent consult visits: 11/15/20 - Neurology - CPAP working well, more concerned about his RLS. He states that he has [facility].  He cannot tolerate this medication as it caused altered mental status.  He is currently taking gabapentin 600 mg 4 times a day in addition to Mirapex 1 mg at bedtime.  She does not feel comfortable adding or adjusting any medication until he is able to reduce his dose of gabapentin.  Probably in the patient's best interest to continue following with Dr. SpectJeraldine Lootsl he is able to get the patient on a regimen that treats his RLS. 10/19/20 - Endocrinology - note unavailable  Hospital visits: 11/02/20 - ED - diplopia, fall, delirium   Objective:  Lab Results  Component Value Date   CREATININE 1.50 11/10/2020   BUN 32 (H) 11/10/2020   GFR 41.64 (L) 11/10/2020   GFRNONAA 42 (L) 11/05/2020   GFRAA 54 (L) 04/21/2020   NA 137 11/10/2020   K 4.4 11/10/2020   CALCIUM 8.9 11/10/2020   CO2 28 11/10/2020    Lab Results  Component Value Date/Time   HGBA1C 8.0 (H) 11/03/2020 04:43 AM   HGBA1C 7.9 (A) 06/01/2020 11:26 AM   HGBA1C 8.0 (H) 04/15/2020 07:33 AM   HGBA1C 8.6 12/05/2019 12:00 AM   HGBA1C 7.9 08/21/2019 12:00 AM   GFR 41.64 (L) 11/10/2020 01:08 PM   GFR 46.43 (L) 04/15/2020 07:33 AM   MICROALBUR 2.0 (H) 04/21/2013 11:19 AM   MICROALBUR 1.9 07/05/2012 11:21 AM  Last diabetic Eye exam:  Lab Results  Component Value Date/Time   HMDIABEYEEXA No Retinopathy 11/23/2020 12:00 AM    Last diabetic Foot exam: completed regularly with endocrinology per chart review  Lab Results  Component Value Date   CHOL 139 04/15/2020   HDL 18.10 (L) 04/15/2020   LDLCALC 53 12/05/2019   LDLDIRECT 87.0 04/15/2020   TRIG 244.0 (H) 04/15/2020   CHOLHDL 8 04/15/2020    Hepatic Function Latest Ref Rng &  Units 11/10/2020 11/02/2020 07/01/2020  Total Protein 6.5 - 8.1 g/dL - 6.4(L) 6.6  Albumin 3.5 - 5.2 g/dL 3.9 3.1(L) 3.3(L)  AST 15 - 41 U/L - 24 24  ALT 0 - 44 U/L - 16 18  Alk Phosphatase 38 - 126 U/L - 39 40  Total Bilirubin 0.3 - 1.2 mg/dL - 0.2(L) 0.7    Lab Results  Component Value Date/Time   TSH 2.763 11/03/2020 04:43 AM   TSH 0.90 04/15/2020 07:33 AM   TSH 3.09 12/05/2019 12:00 AM   TSH 4.91 08/21/2019 12:00 AM   TSH 4.37 10/30/2018 10:28 AM   FREET4 0.75 10/30/2018 10:28 AM   FREET4 0.72 07/18/2017 01:51 PM    CBC Latest Ref Rng & Units 11/10/2020 11/04/2020 11/02/2020  WBC 4.0 - 10.5 K/uL 8.0 8.3 -  Hemoglobin 13.0 - 17.0 g/dL 12.4(L) 11.7(L) 12.6(L)  Hematocrit 39.0 - 52.0 % 36.7(L) 34.7(L) 37.0(L)  Platelets 150.0 - 400.0 K/uL 184.0 166 -    Lab Results  Component Value Date/Time   VD25OH 40.46 04/15/2020 07:33 AM   VD25OH 35.64 01/23/2019 08:49 AM    Clinical ASCVD: Yes  The ASCVD Risk score Mikey Bussing DC Jr., et al., 2013) failed to calculate for the following reasons:   The 2013 ASCVD risk score is only valid for ages 64 to 57    Depression screen PHQ 2/9 06/01/2020 04/22/2020 01/22/2019  Decreased Interest 0 0 0  Down, Depressed, Hopeless 0 0 0  PHQ - 2 Score 0 0 0  Altered sleeping 1 1 0  Tired, decreased energy 1 1 0  Change in appetite 1 1 0  Feeling bad or failure about yourself  0 0 0  Trouble concentrating 1 2 0  Moving slowly or fidgety/restless 0 3 0  Suicidal thoughts 0 0 0  PHQ-9 Score 4 8 0  Difficult doing work/chores - - Not difficult at all  Some recent data might be hidden    Social History   Tobacco Use  Smoking Status Never Smoker  Smokeless Tobacco Never Used  Tobacco Comment   "used to smoke pipe when I was younger"   BP Readings from Last 3 Encounters:  12/16/20 140/88  11/30/20 126/68  11/15/20 127/72   Pulse Readings from Last 3 Encounters:  12/16/20 77  11/30/20 74  11/15/20 66   Wt Readings from Last 3 Encounters:   11/30/20 190 lb (86.2 kg)  11/15/20 188 lb (85.3 kg)  11/10/20 189 lb 6 oz (85.9 kg)    Assessment/Interventions: Review of patient past medical history, allergies, medications, health status, including review of consultants reports, laboratory and other test data, was performed as part of comprehensive evaluation and provision of chronic care management services.   SDOH:  (Social Determinants of Health) assessments and interventions performed: Yes  SDOH Interventions   Flowsheet Row Most Recent Value  SDOH Interventions   Financial Strain Interventions Intervention Not Indicated  [MEdications affordable]       CCM Care Plan  Allergies  Allergen Reactions  .  Crestor [Rosuvastatin]     Other reaction(s): muscle pain 2.16.2022 Pt is current on this medication without any issues.  . Dulaglutide     Other reaction(s): upset stomach  . Empagliflozin     Other reaction(s): yeast infection  . Gabapentin Other (See Comments)    At high doses - BLURRED VISION, BALLANCE, CONCENTRATION PROBLEMS, RESTLESSNESS, NOT SLEEPING, ? LOST OF STRENGTH, UNSTEADINESS  . Lisinopril Cough    tachycardia  . Nexium [Esomeprazole] Other (See Comments)    Headache  . Niacin And Related Other (See Comments)    flushing  . Oxycodone Other (See Comments)    Even 1/2 tablet of 59m caused increased confusion  . Pregabalin     Other reaction(s): MALAISE, SLIGHT NAUSEA, SLIGHT HEADACHE    Medications Reviewed Today    Reviewed by ADebbora Dus RVa [facility](Pharmacist) on 12/30/20 at 1522  Med List Status: <None>  Medication Order Taking? Sig Documenting Provider Last Dose Status Informant  Alpha-Lipoic Acid 600 MG CAPS [id number] Take 1 capsule (600 mg total) by mouth daily.  Patient taking differently: Take 1 capsule by mouth in the morning and at bedtime.   GRia Bush James Bell Taking Active Self  BD PEN NEEDLE NANO 2ND GEN 32G X 4 MM MISC [id number] Use to inject medication daily GRia Bush  James Bell Taking Active   celecoxib (CELEBREX) 200 MG capsule [id number] Take 1 capsule (200 mg total) by mouth daily.  Patient taking differently: Take 200 mg by mouth daily as needed for mild pain.   [place], SFrederico Hamman James Bell Taking Active   clopidogrel (PLAVIX) 75 MG tablet [id number] Take 1 tablet (75 mg total) by mouth daily. GRia Bush James Bell Taking Active Multiple Informants  Coenzyme Q10 (COQ10) 150 MG CAPS [id number] Take 1 capsule by mouth daily. Provider, Historical, James Bell Taking Active Self  diclofenac Sodium (VOLTAREN) 1 % GEL [id number] Apply 2 g topically 3 (three) times daily as needed.  Patient taking differently: Apply 2 g topically 3 (three) times daily as needed (pain).   GRia Bush James Bell Taking Active   dorzolamide-timolol (COSOPT) 22.3-6.8 MG/ML ophthalmic solution [id number]  Provider, Historical, James Bell Taking Active   DULoxetine (CYMBALTA) 60 MG capsule [id number] TCharlos HeightsGRia Bush James Bell Taking Active Multiple Informants  famotidine (PEPCID) 20 MG tablet [id number] Take 1 tablet (20 mg total) by mouth at bedtime.  Patient taking differently: Take 20 mg by mouth daily. PRN   GRia Bush James Bell Taking Active Self  gabapentin (NEURONTIN) 300 MG capsule [id number] Take 600 mg by mouth 3 (three) times daily. 600 mg BID is his minimum dose without having breakthrough symptoms. He is aware 900 mg/day is recommended per kidney function. GRia Bush James Bell Taking Active   insulin degludec (TRESIBA FLEXTOUCH) 200 UNIT/ML FlexTouch Pen [id number] Inject 90 Units into the skin at bedtime. As needed GRia Bush James Bell Taking Active Multiple Informants  insulin lispro (HUMALOG) 100 UNIT/ML KwikPen [id number] Inject 4 Units into the skin daily at 12 noon. With largest meal; Sliding Scale prior to meals if needed Provider, Historical, James Bell Taking Active Self  l-methylfolate-B6-B12 (METANX) 3-35-2 MG TABS tablet [id number] Take 1 tablet by  mouth daily. Swayze, Ava, DO Taking Active Multiple Informants  levothyroxine (SYNTHROID) 50 MCG tablet [id number] Take 1 tablet (50 mcg total) by mouth daily before breakfast. Per endo Dr KTrish Fountain James Bell Taking Active Multiple Informants  LORazepam (ATIVAN) 0.5 MG tablet [id number] Take 1  tablet (0.5 mg total) by mouth at bedtime as needed for sleep. [name], James Bell Taking Active   LUMIGAN 0.01 % Bailey Mech [id number] Yes Place 1 drop into the right eye at bedtime. [name], James Bell Taking Active Multiple Informants  melatonin 5 MG TABS [id number] Yes Take 5 mg by mouth at bedtime. Provider, Historical, James Bell Taking Active Self  metoprolol tartrate (LOPRESSOR) 25 MG tablet [id number] Yes TAKE ONE-HALF (1/2) TABLET (12.5 MG) AT BEDTIME WITH MORNING DOSE IF NEEDED  Patient taking differently: Take 25 mg by mouth at bedtime. PRN palpitations   [name], James Bell Taking Active Self           Med Note Andree Elk, Camara Renstrom   Fri Nov 19, 2020  3:05 PM)    nitroGLYCERIN (NITROSTAT) 0.4 MG SL tablet [id number] Yes DISSOLVE 1 TABLET UNDER THE TONGUE EVERY 5 MINUTES AS NEEDED FOR CHEST PAIN  Patient taking differently: Place 0.4 mg under the tongue every 5 (five) minutes as needed for chest pain.   [name], James Bell Taking Active            Med Note Nash Mantis, TIFFANI S   Wed Nov 03, 2020  1:34 PM)    Omega-3 Fatty Acids (FISH OIL) 1000 MG CAPS [id number] Yes Take 1 capsule (1,000 mg total) by mouth in the morning and at bedtime. [name], James Bell Taking Active Multiple Informants  ondansetron [facility]) 4 MG tablet [id number] No Take 1 tablet (4 mg total) by mouth every 8 (eight) hours as needed for nausea or vomiting.  Patient not taking: Reported on 12/30/2020   [name], James Bell Not Taking Active   pramipexole (MIRAPEX) 1 MG tablet [id number] Yes Take 1 mg by mouth See admin instructions. At Harlon Flor, James Bell Taking Active Multiple Informants  RABEprazole (ACIPHEX) 20 MG tablet  [id number] Yes TAKE 1 TABLET DAILY [name], James Bell Taking Active   rosuvastatin (CRESTOR) 40 MG tablet [id number] Yes Take 1 tablet (40 mg total) by mouth daily. [name], James Bell Taking Active Multiple Informants  Semaglutide, 1 MG/DOSE, (OZEMPIC, 1 MG/DOSE,) 2 MG/1.5ML SOPN [id number] Yes Inject 1 mg into the skin every Saturday. Provider, Historical, James Bell Taking Active Multiple Informants  senna-docusate (SENOKOT-S) 8.6-50 MG tablet [id number] Yes Take 1 tablet by mouth at bedtime. Florencia Reasons, James Bell Taking Active   tamsulosin [facility]) 0.4 MG CAPS capsule [id number] Yes Take 1 capsule (0.4 mg total) by mouth daily after breakfast. [name], James Bell Taking Active   valsartan (DIOVAN) 40 MG tablet [id number] No Take 0.5 tablets (20 mg total) by mouth daily.  Patient not taking: Reported on 12/30/2020   [name], James Bell Not Taking Consider Medication Status and Discontinue   vitamin C (ASCORBIC ACID) 500 MG tablet [id number] Yes Take 1,000 mg by mouth daily as needed. Provider, Historical, James Bell Taking Active Multiple Informants          Patient Active Problem List   Diagnosis Date Noted  . Acute encephalopathy 11/02/2020  . Dysuria 10/18/2020  . Healthcare maintenance 06/01/2020  . Involuntary movements 04/25/2020  . Post-nasal drainage 07/16/2019  . Recurrent productive cough 05/31/2019  . Hx of basal cell carcinoma 07/10/2017  . Rosacea   . Gait disturbance, post-stroke 10/05/2015  . Presbycusis of both ears   . Advanced care planning/counseling discussion 06/23/2015  . Impaired mobility 04/23/2015  . Hearing loss of right ear due to cerumen impaction 03/23/2015  . HTN (hypertension) 03/18/2015  . Diastolic dysfunction 09/38/1829  . Spastic hemiparesis affecting dominant side ([place]) 07/07/2014  .  Chest pressure 02/03/2014  . Medicare annual wellness visit, subsequent 11/06/2013  . Erectile dysfunction due to arterial insufficiency 02/05/2013  . MDD (major depressive disorder),  recurrent episode, moderate ([place])   . Palpitations 06/26/2012  . Hyperlipidemia associated with type 2 diabetes mellitus ([place])   . Restless leg syndrome   . GERD (gastroesophageal reflux disease)   . BPH with obstruction/lower urinary tract symptoms   . History of CVA (cerebrovascular accident) 03/11/2012  . Hemiparesis affecting right side as late effect of stroke ([place]) 03/11/2012  . Type 2 diabetes mellitus, uncontrolled, with neuropathy ([place]) 03/11/2012  . OSA on CPAP 03/11/2012  . CKD stage 3 secondary to diabetes ([place]) 03/11/2012  . Migraines 03/11/2012  . Thrombocytopenia ([place]) 2011    Immunization History  Administered Date(s) Administered  . Fluad Quad(high Dose 65+) 06/05/2019, 06/01/2020  . Influenza Split 06/25/2012  . Influenza, High Dose Seasonal PF 06/17/2018  . Influenza,inj,Quad PF,6+ Mos 06/02/2013, 06/09/2014, 06/23/2015, 07/17/2016, 07/18/2017  . PFIZER(Purple Top)SARS-COV-2 Vaccination 10/12/2019, 11/03/2019, 06/30/2020  . Pneumococcal Conjugate-13 11/06/2013  . Pneumococcal Polysaccharide-23 09/19/2007, 09/18/2008  . Td 07/17/2016  . Tdap 05/24/2018  . Zoster 09/26/2007    Conditions to be addressed/monitored:  Hypertension, Hyperlipidemia, Diabetes, BPH and RLS   Patient Care Plan: CCM Pharmacy Care Plan    Problem Identified: CHL AMB "PATIENT-SPECIFIC PROBLEM"     Long-Range Goal: Disease Management   Start Date: 11/18/2020  Priority: High  Note:    Current Barriers:  . Unable to achieve control of restless leg syndrome   Pharmacist Clinical Goal(s):  Marland Kitchen Over the next 90 days, patient will contact provider office for questions/concerns as evidenced notation of same in electronic health record through collaboration with PharmD and provider.   Interventions: . 1:1 collaboration with [name], James Bell regarding development and update of comprehensive plan of care as evidenced by provider attestation and co-signature . Inter-disciplinary care team  collaboration (see longitudinal plan of care) . Comprehensive medication review performed; medication list updated in electronic medical record  Hypertension (BP goal <140/90) -Controlled - home readings within goal -Current treatment: . Metoprolol tartrate 25 mg - 1/2 tablet at bedtime as needed for tachycardia -Medications previously tried: Lisinopril - tachycardia, Valsartan on hold   -Current home readings: 120/68 this week with home health nurse -Last UACR < 30, 2014 -Denies hypotensive/hypertensive symptoms -Educated on Importance of home blood pressure monitoring; -Counseled to monitor BP at home daily, document, and provide log at future appointments -Recommend continue current medications   Hyperlipidemia: (LDL goal < 70) -Controlled - LDL 53 -Current treatment: . Rosuvastatin 40 mg - 1 tablet daily  -Medications previously tried: pt reports multiple prior failed therapies -Educated on Benefits of statin for ASCVD risk reduction; -Recommended to continue current medications  Diabetes (A1c goal <8%  ADA - age/comorbidities) -Controlled - A1c 8.0%  Followed by endocrinology -Current medications: . Ozempic 1 mg - Inject once weekly . Tresiba - Inject 90 units at bedtime  . Humalog (Insulin Lispro) Inject 4 units prior to lunchtime meal (taking 5-15 units pre-meal PRN if eating large meal) -Medications previously tried: Janumet, glyburide - pt reports upset stomach -Adherence - reports taking Humalog sliding scale (5-15 units, usually 15 units) prior to lunch. Confirms 90 units Tresiba and 1 mg Ozempic weekly. -Current home glucose readings - using Freestyle Libre . Post prandial glucose: none reported today . Denies any hypoglycemia -Recommended to continue current medication  RLS (Goal: Improve symptoms) -Controlled - pt reports symptoms controlled -Current treatment  .  Gabapentin 300 mg - 1 up to four times daily  (pt taking differently: 2 caps four times  daily) . Cymbalta 60 mg - 1 capsule daily at 9 PM . Pramipexole 1 mg - 1 tablet daily at 9 PM . Alpha lipoic acid 600 mg - 1 twice daily  . Lorazepam - 1 tablet as needed at bedtime  -Medications previously tried: oxycodone - delirium -Pt reports he has not tried lorazepam. Let him know he could try this and reduce gabapentin dose slowly down to 1252m/day -Pt reports he cannot see Dr. SJeraldine Lootsuntil Nov 2022.  -Recommended to continue current medication; Trial lorazepam as PCP recommended. Try reducing gabapentin dose slowly to 1200 mg max/day.  BPH -Uncontrolled - pt reports nocturia x 2 nightly -Current treatment  Tamsulosin 0.4 mg - 1 capsule daily (not taking) Recent history: Flomax started by hospital early 2022 and never refilled. Pt ran out after 30 days. Saw PCP and discussed tamsulosin not very effective, PCP recommended evening dosing after supper. Pt never tried this as he did not have any refills. - We discussed: He would like to trial tamsulosin again but take it after supper. -Recommended resume tamsulosin after supper. Sent note to CMA to refill tamsulosin to [facility].   Patient Goals/Self-Care Activities . Over the next 90 days, patient will:  - check glucose daily, document, and provide at future appointments  Follow Up Plan: Telephone follow up appointment with care management team member scheduled for: 3 months; Monthly [facility] adherence calls    Medication Assistance:  None required.  Patient affirms current coverage meets needs.  Patient's preferred pharmacy is:  [facility] [place] [address] [place]Phone: [phone]: [phone] We discussed: Benefits of medication synchronization, packaging and delivery as well as enhanced pharmacist oversight with [facility]. Patient decided to: Utilize [facility] pharmacy for medication synchronization, packaging and delivery    Pt endorses compliance to all medications. Reviewed all medications in detail and made a synchronization plan. Contacted mail order pharmacy for verbal confirmation to transfer all prescriptions. Pt will receive 30 DS adherence packaging with 5 time slots - estimated sync date 03/22/21. Contacted multiple specialists for refills to ensure patient does not go without medications.  Verbal consent obtained for [facility] enhanced pharmacy services (medication synchronization, adherence packaging, delivery coordination). A medication sync plan was created to allow patient to get all medications delivered once every 30 to 90 days per patient preference. Patient understands they have freedom to choose pharmacy and clinical pharmacist will coordinate care between all prescribers and [facility].  Care Plan and Follow Up Patient Decision:  Patient agrees to Care Plan and Follow-up.  MDebbora Dus PharmD Clinical Pharmacist [phone]

## 2020-12-30 NOTE — Telephone Encounter (Signed)
-----   Message from [place], [facility] sent at 12/30/2020  1:47 PM EDT ----- Regarding: Refills Refill request on: pramipexole, tamsulosin, and Plavix to [facility].  Thank you!  [name], PharmD Clinical Pharmacist [facility] at [facility] [phone]

## 2020-12-30 NOTE — Telephone Encounter (Addendum)
FYI to PCP/update on patient  Pt still taking gabapentin 600 mg four times daily. Has not tried lorazepam. Encouraged him to try this and slowly lower gabapentin to max dose 1200 mg/day/CKD. Reports he is not able to see Dr. Jeraldine Loots sooner than November 2022. Overall states his RLS/nerve pain is fairly controlled.  [name], PharmD Clinical Pharmacist [facility] at [facility] [phone]

## 2020-12-30 NOTE — Telephone Encounter (Signed)
Plavix last rx:  01/13/20, #90/3 Mirapex last rx:  04/22/20 Last OV:  11/30/20, 3 mo f/u Next OV:  03/08/21, 3 mo f/u

## 2020-12-30 NOTE — Telephone Encounter (Signed)
ERx 

## 2020-12-31 NOTE — Patient Instructions (Signed)
Dear James Spurr, MD,  Below is a summary of the goals we discussed during our follow up appointment on December 31, 2020. Please contact me anytime with questions or concerns.   Visit Information  Patient Care Plan: CCM Pharmacy Care Plan    Problem Identified: CHL AMB "PATIENT-SPECIFIC PROBLEM"     Long-Range Goal: Disease Management   Start Date: 11/18/2020  Priority: High  Note:    Current Barriers:  . Unable to achieve control of restless leg syndrome   Pharmacist Clinical Goal(s):  Marland Kitchen Over the next 90 days, patient will contact provider office for questions/concerns as evidenced notation of same in electronic health record through collaboration with PharmD and provider.   Interventions: . 1:1 collaboration with [name], MD regarding development and update of comprehensive plan of care as evidenced by provider attestation and co-signature . Inter-disciplinary care team collaboration (see longitudinal plan of care) . Comprehensive medication review performed; medication list updated in electronic medical record  Hypertension (BP goal <140/90) -Controlled - home readings within goal -Current treatment: . Metoprolol tartrate 25 mg - 1/2 tablet at bedtime as needed for tachycardia -Medications previously tried: Lisinopril - tachycardia, Valsartan on hold   -Current home readings: 120/68 this week with home health nurse -Last UACR < 30, 2014 -Denies hypotensive/hypertensive symptoms -Educated on Importance of home blood pressure monitoring; -Counseled to monitor BP at home daily, document, and provide log at future appointments -Recommend continue current medications   Hyperlipidemia: (LDL goal < 70) -Controlled - LDL 53 -Current treatment: . Rosuvastatin 40 mg - 1 tablet daily  -Medications previously tried: pt reports multiple prior failed therapies -Educated on Benefits of statin for ASCVD risk reduction; -Recommended to continue current  medications  Diabetes (A1c goal <8%  ADA - age/comorbidities) -Controlled - A1c 8.0%  Followed by endocrinology -Current medications: . Ozempic 1 mg - Inject once weekly . Tresiba - Inject 90 units at bedtime  . Humalog (Insulin Lispro) Inject 4 units prior to lunchtime meal (taking 5-15 units pre-meal PRN if eating large meal) -Medications previously tried: Janumet, glyburide - pt reports upset stomach -Adherence - reports taking Humalog sliding scale (5-15 units, usually 15 units) prior to lunch. Confirms 90 units Tresiba and 1 mg Ozempic weekly. -Current home glucose readings - using Freestyle Libre . Post prandial glucose: none reported today . Denies any hypoglycemia -Recommended to continue current medication  RLS (Goal: Improve symptoms) -Controlled - pt reports symptoms controlled -Current treatment  . Gabapentin 300 mg - 1 up to four times daily  (pt taking differently: 2 caps four times daily) . Cymbalta 60 mg - 1 capsule daily at 9 PM . Pramipexole 1 mg - 1 tablet daily at 9 PM . Alpha lipoic acid 600 mg - 1 twice daily  . Lorazepam - 1 tablet as needed at bedtime  -Medications previously tried: oxycodone - delirium -Pt reports he has not tried lorazepam. Let him know he could try this and reduce gabapentin dose slowly down to 1200mg /day -Pt reports he cannot see Dr. Jeraldine Loots until Nov 2022.  -Recommended to continue current medication; Trial lorazepam as PCP recommended. Try reducing gabapentin dose slowly to 1200 mg max/day.  BPH -Uncontrolled - pt reports nocturia x 2 nightly -Current treatment  Tamsulosin 0.4 mg - 1 capsule daily (not taking) Recent history: Flomax started by hospital early 2022 and never refilled. Pt ran out after 30 days. Saw PCP and discussed tamsulosin not very effective, PCP recommended evening dosing after supper. Pt never  tried this as he did not have any refills. - We discussed: He would like to trial tamsulosin again but take it after  supper. -Recommended resume tamsulosin after supper. Sent note to CMA to refill tamsulosin to [facility].   Patient Goals/Self-Care Activities . Over the next 90 days, patient will:  - check glucose daily, document, and provide at future appointments  Follow Up Plan: Telephone follow up appointment with care management team member scheduled for: 3 months; Monthly [facility] adherence calls      The patient verbalized understanding of instructions, educational materials, and care plan provided today and agreed to receive a mailed copy of patient instructions, educational materials, and care plan.   [name], PharmD Clinical Pharmacist [facility] at [facility] [phone]

## 2021-01-01 NOTE — Progress Notes (Signed)
Encounter details: CCM Time Spent       Value Time User   Time spent with patient (minutes)  210 12/31/2020  2:05 PM [name], [facility]   Time spent performing Chart review  30 12/31/2020  2:05 PM [name], [facility]   Total time (minutes)  240 12/31/2020  2:05 PM [name], RPH      Moderate to High Complex Decision Making     None      CCM Services: This encounter meets routine CCM services.  Prior to outreach and patient consent for Chronic Care Management, I referred this patient for services after reviewing the nominated patient list or from a personal encounter with the patient.  I have personally reviewed this encounter including the documentation in this note and have collaborated with the care management provider regarding care management and care coordination activities to include development and update of the comprehensive care plan. I am certifying that I agree with the content of this note and encounter as supervising physician.

## 2021-01-03 NOTE — Telephone Encounter (Signed)
Noted. Agree with recommendations.  

## 2021-01-05 ENCOUNTER — Telehealth: Payer: Self-pay

## 2021-01-05 NOTE — Telephone Encounter (Signed)
[facility] pharmacy out of network with patient's [place]. We discussed other options for pharmacies that pill pack. Patient interested in changing to [facility]. Prescriptions transferred and coordinated care with [facility] pharmacist- Caryl Pina. Patient aware of change. Changed pharmacy in chart.  [name], PharmD Clinical Pharmacist [facility] at [facility] [phone]

## 2021-01-06 DIAGNOSIS — E1140 Type 2 diabetes mellitus with diabetic neuropathy, unspecified: Secondary | ICD-10-CM | POA: Diagnosis not present

## 2021-01-06 DIAGNOSIS — R269 Unspecified abnormalities of gait and mobility: Secondary | ICD-10-CM | POA: Diagnosis not present

## 2021-01-06 DIAGNOSIS — G2581 Restless legs syndrome: Secondary | ICD-10-CM | POA: Diagnosis not present

## 2021-01-06 DIAGNOSIS — G8110 Spastic hemiplegia affecting unspecified side: Secondary | ICD-10-CM | POA: Diagnosis not present

## 2021-01-06 DIAGNOSIS — I69351 Hemiplegia and hemiparesis following cerebral infarction affecting right dominant side: Secondary | ICD-10-CM | POA: Diagnosis not present

## 2021-01-11 ENCOUNTER — Encounter: Payer: Self-pay | Admitting: Family Medicine

## 2021-01-11 ENCOUNTER — Other Ambulatory Visit: Payer: Medicare Other

## 2021-01-11 ENCOUNTER — Other Ambulatory Visit: Payer: Self-pay

## 2021-01-11 ENCOUNTER — Telehealth: Payer: Self-pay

## 2021-01-11 ENCOUNTER — Other Ambulatory Visit: Payer: Self-pay | Admitting: Family Medicine

## 2021-01-11 VITALS — BP 130/68 | HR 70 | Temp 97.3°F

## 2021-01-11 DIAGNOSIS — Z515 Encounter for palliative care: Secondary | ICD-10-CM

## 2021-01-11 NOTE — Telephone Encounter (Signed)
Received faxed Rehab Physician Orders from [facility]  Placed form in Dr. Synthia Innocent box.

## 2021-01-11 NOTE — Progress Notes (Signed)
PATIENT NAME: James Spurr, MD DOB: December 18, 1930 MRN: [id number]  PRIMARY CARE PROVIDER: [name], MD  RESPONSIBLE PARTY:  Acct ID - Guarantor Home Phone Work Phone Relationship Acct Type  [id number] - Toppins,MAR* [phone]  Self P/F     [address], [place], [place] [address]    PLAN OF CARE and INTERVENTIONS:               1.  GOALS OF CARE/ ADVANCE CARE PLANNING:  Patient will be moving to [facility] in [place] next week.               2.  PATIENT/CAREGIVER EDUCATION:                 4. PERSONAL EMERGENCY PLAN:  Activate 911 for emergencies.               5.  DISEASE STATUS:  Joint visit with [name], SW and patient.  Patient is up in his electric wheelchair this am.  Patient provides [name] with an update since our last visit.  He has elected to move to [place] next week.  He has already begun the process of moving and condensing his property.  Patient openly discussed the process of this decision and the multitude of feelings he has experienced. Patient engaged in life review and shared a book he has compiled for his daughters that shares his life and information about his parents.  He does have the support of his 2 daughters who will be present this weekend to further assist with the move.  Patient states there are therapy services at the facility that include Silver Sneakers and PT/OT services.  He is looking forward to starting this program to see if he can improve his strength.     He is currently receiving bubble packs for his medications and notes there are pros/cons to this.  He will continue this process for 1 month and then make a decision on weather to proceed with bubble packs.  Patient reports concerns over moving plants and 70" television.  SW is able to assist with resources to aide patient in the move.  Patient denies any new concerns at this time.  He is notified of Palliative Care NP that will begin following  him after his move to [facility]      HISTORY OF PRESENT ILLNESS:  87 year old male with HTN and Type 2 Diabetes.  Patient is being followed by Palliative Care monthly and PRN.  CODE STATUS: Full ADVANCED DIRECTIVES: Yes MOST FORM: No PPS: 50%   PHYSICAL EXAM:   VITALS: Today's Vitals   01/11/21 1025  BP: 130/68  Pulse: 70  Temp: (!) 97.3 F (36.3 C)  SpO2: 97%  PainSc: 0-No pain    LUNGS: CTA, no rhonchi, rales or wheezes present. CARDIAC: HRR EXTREMITIES: - for edema. SKIN: warm and dry to touch.  No skin breakdown present. NEURO: alert and oriented x 4       Lorenza Burton, RN

## 2021-01-11 NOTE — Progress Notes (Signed)
COMMUNITY PALLIATIVE CARE SW NOTE  PATIENT NAME: James Sarks, MD DOB: [birth year] MRN: [id number]  PRIMARY CARE PROVIDER: Eustaquio Boyden, MD  RESPONSIBLE PARTY:  Acct ID - Guarantor Home Phone Work Phone Relationship Acct Type  [id number] - Heather,MAR* [phone]  Self P/F     [address], [place], [place] [address]     PLAN OF CARE and INTERVENTIONS:             1. GOALS OF CARE/ ADVANCE CARE PLANNING: Patient has living will in place. Patient shared that he is a DNR, but form is not present at this time. MOST form discussed with patient as well. Patients goal is to transition to [facility] independent living.   2. SOCIAL/EMOTIONAL/SPIRITUAL ASSESSMENT/ INTERVENTIONS: SW and RN met with patient for an initial visit to introduce palliative care services, on behalf of assigned palliative care NP. Patient lives independently in a one story home/condo. Patient has a caregiver visit 3x/wk to do light house keeping.    Patient provided update of recent medical changes. Patient recently switched to bubble pack medications with [facility] pharmacy. Patient has not had any changes medically since last visit. No recent falls. Patient eats well. Patient shares that his sleeping okay.  Patient has decided to transition to [facility] Independent living. Move in time frame is the first week of May. Patient shared that he feels [facility] will be a good fit for him and that the resident and staff there have all been very nice and welcoming of him. Patient is currently in the process of packing and daughter will be assisting. Patient is mentally processing letting go of his home and detaching from a lot of his belongings. SW assisted patient with locating assistance to have his TV dismounted and mounted through [employer].   RN checked vitals and reviewed. Patient states that he will remain with [facility] pharmacy for a month to see how he will like the bubble packs, however currently he feels  it is not advantageous to him. PCP aware of patients move to independent living. Patient will consider continuing therapy at [place].   SW discussed care and goals. No further psychosocial needs voiced or identified by patient. Patient had no other concerns or needs of palliative care team at this time. Patient is doing well overall. Palliative care NP will continue to follow.  3. PATIENT/CAREGIVER EDUCATION/ COPING:  Patient is alert and oriented x4.  No signs or symptoms of anxiety or depression. Patient enjoys reading. Patient has 2 daughters and a nephew that are supportive and live locally. Patient shared a biography of himself that he wrote with [facility] team and shared a lot of his hx and memories.   4. PERSONAL EMERGENCY PLAN: Patient to call 9-1-1 for emergencies.  5.  COMMUNITY RESOURCES COORDINATION/ HEALTH CARE NAVIGATION:  Patient manages own medical needs. Has a Programmer, applications 2-3x a week.  6. FINANCIAL/LEGAL CONCERNS/INTERVENTIONS:  None. Manages his own finances.     SOCIAL HX:  Social History   Tobacco Use  . Smoking status: Never Smoker  . Smokeless tobacco: Never Used  . Tobacco comment: "used to smoke pipe when I was younger"  Substance Use Topics  . Alcohol use: Yes    Alcohol/week: 4.0 standard drinks    Types: 2 Glasses of wine, 2 Shots of liquor per week    Comment: occasional    CODE STATUS: Full code. Wishes to be DNR ADVANCED DIRECTIVES: Y MOST FORM COMPLETE: TBD HOSPICE EDUCATION PROVIDED: N  PPS: Patient is independent with all ADL's and able to drive.    Time spent: 1hr 48min      [place], [place]

## 2021-01-11 NOTE — Telephone Encounter (Signed)
Filled and in lisa's box.  

## 2021-01-12 ENCOUNTER — Other Ambulatory Visit: Payer: Self-pay | Admitting: Family Medicine

## 2021-01-12 DIAGNOSIS — IMO0002 Reserved for concepts with insufficient information to code with codable children: Secondary | ICD-10-CM

## 2021-01-12 DIAGNOSIS — E1140 Type 2 diabetes mellitus with diabetic neuropathy, unspecified: Secondary | ICD-10-CM

## 2021-01-12 DIAGNOSIS — E1165 Type 2 diabetes mellitus with hyperglycemia: Secondary | ICD-10-CM

## 2021-01-12 NOTE — Telephone Encounter (Signed)
Faxed order

## 2021-01-13 ENCOUNTER — Ambulatory Visit (HOSPITAL_COMMUNITY): Payer: Medicare Other | Admitting: Physical Therapy

## 2021-01-25 DIAGNOSIS — R2689 Other abnormalities of gait and mobility: Secondary | ICD-10-CM | POA: Diagnosis not present

## 2021-01-25 DIAGNOSIS — R2681 Unsteadiness on feet: Secondary | ICD-10-CM | POA: Diagnosis not present

## 2021-01-25 DIAGNOSIS — G8191 Hemiplegia, unspecified affecting right dominant side: Secondary | ICD-10-CM | POA: Diagnosis not present

## 2021-01-25 DIAGNOSIS — R296 Repeated falls: Secondary | ICD-10-CM | POA: Diagnosis not present

## 2021-01-27 DIAGNOSIS — G8191 Hemiplegia, unspecified affecting right dominant side: Secondary | ICD-10-CM | POA: Diagnosis not present

## 2021-01-27 DIAGNOSIS — R279 Unspecified lack of coordination: Secondary | ICD-10-CM | POA: Diagnosis not present

## 2021-01-27 DIAGNOSIS — R296 Repeated falls: Secondary | ICD-10-CM | POA: Diagnosis not present

## 2021-01-27 DIAGNOSIS — M6281 Muscle weakness (generalized): Secondary | ICD-10-CM | POA: Diagnosis not present

## 2021-01-28 DIAGNOSIS — R2689 Other abnormalities of gait and mobility: Secondary | ICD-10-CM | POA: Diagnosis not present

## 2021-01-28 DIAGNOSIS — G8191 Hemiplegia, unspecified affecting right dominant side: Secondary | ICD-10-CM | POA: Diagnosis not present

## 2021-01-28 DIAGNOSIS — R296 Repeated falls: Secondary | ICD-10-CM | POA: Diagnosis not present

## 2021-01-28 DIAGNOSIS — R2681 Unsteadiness on feet: Secondary | ICD-10-CM | POA: Diagnosis not present

## 2021-01-29 DIAGNOSIS — R2681 Unsteadiness on feet: Secondary | ICD-10-CM | POA: Diagnosis not present

## 2021-01-29 DIAGNOSIS — R2689 Other abnormalities of gait and mobility: Secondary | ICD-10-CM | POA: Diagnosis not present

## 2021-01-29 DIAGNOSIS — G8191 Hemiplegia, unspecified affecting right dominant side: Secondary | ICD-10-CM | POA: Diagnosis not present

## 2021-01-31 DIAGNOSIS — G8191 Hemiplegia, unspecified affecting right dominant side: Secondary | ICD-10-CM | POA: Diagnosis not present

## 2021-01-31 DIAGNOSIS — R279 Unspecified lack of coordination: Secondary | ICD-10-CM | POA: Diagnosis not present

## 2021-01-31 DIAGNOSIS — M6281 Muscle weakness (generalized): Secondary | ICD-10-CM | POA: Diagnosis not present

## 2021-01-31 DIAGNOSIS — E1165 Type 2 diabetes mellitus with hyperglycemia: Secondary | ICD-10-CM | POA: Diagnosis not present

## 2021-01-31 DIAGNOSIS — R296 Repeated falls: Secondary | ICD-10-CM | POA: Diagnosis not present

## 2021-01-31 DIAGNOSIS — R2689 Other abnormalities of gait and mobility: Secondary | ICD-10-CM | POA: Diagnosis not present

## 2021-01-31 DIAGNOSIS — R2681 Unsteadiness on feet: Secondary | ICD-10-CM | POA: Diagnosis not present

## 2021-02-02 ENCOUNTER — Telehealth: Payer: Self-pay

## 2021-02-02 DIAGNOSIS — M6281 Muscle weakness (generalized): Secondary | ICD-10-CM | POA: Diagnosis not present

## 2021-02-02 DIAGNOSIS — R279 Unspecified lack of coordination: Secondary | ICD-10-CM | POA: Diagnosis not present

## 2021-02-02 DIAGNOSIS — G8191 Hemiplegia, unspecified affecting right dominant side: Secondary | ICD-10-CM | POA: Diagnosis not present

## 2021-02-02 DIAGNOSIS — R296 Repeated falls: Secondary | ICD-10-CM | POA: Diagnosis not present

## 2021-02-02 NOTE — Telephone Encounter (Addendum)
Would ensure he's not currently taking senakot.  What OTC remedies has he tried to date?  Any blood in stool, fever, abd pain? If not, may try imodium OTC 1 tab 1-2 times daily.

## 2021-02-02 NOTE — Telephone Encounter (Signed)
Pt reports diarrhea x 10 day denies other Sx and has tried OTC meds with no relief.... would like advice on what he needs to do.Marland KitchenMarland KitchenMarland Kitchen

## 2021-02-03 DIAGNOSIS — R2681 Unsteadiness on feet: Secondary | ICD-10-CM | POA: Diagnosis not present

## 2021-02-03 DIAGNOSIS — G8191 Hemiplegia, unspecified affecting right dominant side: Secondary | ICD-10-CM | POA: Diagnosis not present

## 2021-02-03 DIAGNOSIS — R2689 Other abnormalities of gait and mobility: Secondary | ICD-10-CM | POA: Diagnosis not present

## 2021-02-03 DIAGNOSIS — R296 Repeated falls: Secondary | ICD-10-CM | POA: Diagnosis not present

## 2021-02-03 NOTE — Telephone Encounter (Signed)
Lvm asking pt to call back. Need to get answer to Dr. G's questions and relay message.  ?

## 2021-02-04 DIAGNOSIS — R279 Unspecified lack of coordination: Secondary | ICD-10-CM | POA: Diagnosis not present

## 2021-02-04 DIAGNOSIS — M6281 Muscle weakness (generalized): Secondary | ICD-10-CM | POA: Diagnosis not present

## 2021-02-04 DIAGNOSIS — G8191 Hemiplegia, unspecified affecting right dominant side: Secondary | ICD-10-CM | POA: Diagnosis not present

## 2021-02-04 DIAGNOSIS — R296 Repeated falls: Secondary | ICD-10-CM | POA: Diagnosis not present

## 2021-02-04 NOTE — Telephone Encounter (Signed)
Lvm asking pt to call back. Need to get answer to Dr. G's questions and relay message.  ?

## 2021-02-05 DIAGNOSIS — G8110 Spastic hemiplegia affecting unspecified side: Secondary | ICD-10-CM | POA: Diagnosis not present

## 2021-02-05 DIAGNOSIS — G2581 Restless legs syndrome: Secondary | ICD-10-CM | POA: Diagnosis not present

## 2021-02-05 DIAGNOSIS — R269 Unspecified abnormalities of gait and mobility: Secondary | ICD-10-CM | POA: Diagnosis not present

## 2021-02-05 DIAGNOSIS — E1140 Type 2 diabetes mellitus with diabetic neuropathy, unspecified: Secondary | ICD-10-CM | POA: Diagnosis not present

## 2021-02-05 DIAGNOSIS — I69351 Hemiplegia and hemiparesis following cerebral infarction affecting right dominant side: Secondary | ICD-10-CM | POA: Diagnosis not present

## 2021-02-07 DIAGNOSIS — M6281 Muscle weakness (generalized): Secondary | ICD-10-CM | POA: Diagnosis not present

## 2021-02-07 DIAGNOSIS — E1142 Type 2 diabetes mellitus with diabetic polyneuropathy: Secondary | ICD-10-CM | POA: Diagnosis not present

## 2021-02-07 DIAGNOSIS — Z794 Long term (current) use of insulin: Secondary | ICD-10-CM | POA: Diagnosis not present

## 2021-02-07 DIAGNOSIS — R2689 Other abnormalities of gait and mobility: Secondary | ICD-10-CM | POA: Diagnosis not present

## 2021-02-07 DIAGNOSIS — G8191 Hemiplegia, unspecified affecting right dominant side: Secondary | ICD-10-CM | POA: Diagnosis not present

## 2021-02-07 DIAGNOSIS — E785 Hyperlipidemia, unspecified: Secondary | ICD-10-CM | POA: Diagnosis not present

## 2021-02-07 DIAGNOSIS — I6930 Unspecified sequelae of cerebral infarction: Secondary | ICD-10-CM | POA: Diagnosis not present

## 2021-02-07 DIAGNOSIS — E039 Hypothyroidism, unspecified: Secondary | ICD-10-CM | POA: Diagnosis not present

## 2021-02-07 DIAGNOSIS — R279 Unspecified lack of coordination: Secondary | ICD-10-CM | POA: Diagnosis not present

## 2021-02-07 DIAGNOSIS — R609 Edema, unspecified: Secondary | ICD-10-CM | POA: Diagnosis not present

## 2021-02-07 DIAGNOSIS — E1165 Type 2 diabetes mellitus with hyperglycemia: Secondary | ICD-10-CM | POA: Diagnosis not present

## 2021-02-07 DIAGNOSIS — R2681 Unsteadiness on feet: Secondary | ICD-10-CM | POA: Diagnosis not present

## 2021-02-07 DIAGNOSIS — R296 Repeated falls: Secondary | ICD-10-CM | POA: Diagnosis not present

## 2021-02-07 NOTE — Telephone Encounter (Signed)
Lvm asking pt to call back.  Need to get answer to Dr. Synthia Innocent questions and relay message.   Mailing a letter.

## 2021-02-08 DIAGNOSIS — R2681 Unsteadiness on feet: Secondary | ICD-10-CM | POA: Diagnosis not present

## 2021-02-08 DIAGNOSIS — R2689 Other abnormalities of gait and mobility: Secondary | ICD-10-CM | POA: Diagnosis not present

## 2021-02-08 DIAGNOSIS — G8191 Hemiplegia, unspecified affecting right dominant side: Secondary | ICD-10-CM | POA: Diagnosis not present

## 2021-02-09 DIAGNOSIS — G8191 Hemiplegia, unspecified affecting right dominant side: Secondary | ICD-10-CM | POA: Diagnosis not present

## 2021-02-09 DIAGNOSIS — M6281 Muscle weakness (generalized): Secondary | ICD-10-CM | POA: Diagnosis not present

## 2021-02-09 DIAGNOSIS — R296 Repeated falls: Secondary | ICD-10-CM | POA: Diagnosis not present

## 2021-02-09 DIAGNOSIS — R279 Unspecified lack of coordination: Secondary | ICD-10-CM | POA: Diagnosis not present

## 2021-02-10 DIAGNOSIS — G8191 Hemiplegia, unspecified affecting right dominant side: Secondary | ICD-10-CM | POA: Diagnosis not present

## 2021-02-10 DIAGNOSIS — R2681 Unsteadiness on feet: Secondary | ICD-10-CM | POA: Diagnosis not present

## 2021-02-10 DIAGNOSIS — R296 Repeated falls: Secondary | ICD-10-CM | POA: Diagnosis not present

## 2021-02-10 DIAGNOSIS — R2689 Other abnormalities of gait and mobility: Secondary | ICD-10-CM | POA: Diagnosis not present

## 2021-02-11 DIAGNOSIS — R279 Unspecified lack of coordination: Secondary | ICD-10-CM | POA: Diagnosis not present

## 2021-02-11 DIAGNOSIS — R2689 Other abnormalities of gait and mobility: Secondary | ICD-10-CM | POA: Diagnosis not present

## 2021-02-11 DIAGNOSIS — G8191 Hemiplegia, unspecified affecting right dominant side: Secondary | ICD-10-CM | POA: Diagnosis not present

## 2021-02-11 DIAGNOSIS — R296 Repeated falls: Secondary | ICD-10-CM | POA: Diagnosis not present

## 2021-02-11 DIAGNOSIS — R2681 Unsteadiness on feet: Secondary | ICD-10-CM | POA: Diagnosis not present

## 2021-02-11 DIAGNOSIS — M6281 Muscle weakness (generalized): Secondary | ICD-10-CM | POA: Diagnosis not present

## 2021-02-15 DIAGNOSIS — M6281 Muscle weakness (generalized): Secondary | ICD-10-CM | POA: Diagnosis not present

## 2021-02-15 DIAGNOSIS — R296 Repeated falls: Secondary | ICD-10-CM | POA: Diagnosis not present

## 2021-02-15 DIAGNOSIS — R2689 Other abnormalities of gait and mobility: Secondary | ICD-10-CM | POA: Diagnosis not present

## 2021-02-15 DIAGNOSIS — R2681 Unsteadiness on feet: Secondary | ICD-10-CM | POA: Diagnosis not present

## 2021-02-15 DIAGNOSIS — G8191 Hemiplegia, unspecified affecting right dominant side: Secondary | ICD-10-CM | POA: Diagnosis not present

## 2021-02-15 DIAGNOSIS — R279 Unspecified lack of coordination: Secondary | ICD-10-CM | POA: Diagnosis not present

## 2021-02-16 DIAGNOSIS — R279 Unspecified lack of coordination: Secondary | ICD-10-CM | POA: Diagnosis not present

## 2021-02-16 DIAGNOSIS — G8191 Hemiplegia, unspecified affecting right dominant side: Secondary | ICD-10-CM | POA: Diagnosis not present

## 2021-02-16 DIAGNOSIS — R296 Repeated falls: Secondary | ICD-10-CM | POA: Diagnosis not present

## 2021-02-16 DIAGNOSIS — M6281 Muscle weakness (generalized): Secondary | ICD-10-CM | POA: Diagnosis not present

## 2021-02-16 NOTE — Telephone Encounter (Signed)
James Bell called in returning [name] call, he stated that he has been moving and missed her calls.

## 2021-02-16 NOTE — Telephone Encounter (Addendum)
Ok to continue liquid imodium as needed in place of tabs.

## 2021-02-16 NOTE — Telephone Encounter (Signed)
Spoke with pt asking about Senokot.  Pt states he is not taking Senokot.  For the diarrhea, pt has been taking Imodium (liquid) after each episode, which is about once daily.  Says he's messed his bed in his sleep a couple of times.  Denies any blood in stool, fever or abd pain.  I relayed Dr. Synthia Innocent message about Imodium tabs.  Pt asks is it better to take tablets or liquid 1-2 times daily.  Plz advise.

## 2021-02-16 NOTE — Telephone Encounter (Signed)
Spoke with pt relaying Dr. G's message. Pt verbalizes understanding.  

## 2021-02-17 DIAGNOSIS — R2689 Other abnormalities of gait and mobility: Secondary | ICD-10-CM | POA: Diagnosis not present

## 2021-02-17 DIAGNOSIS — R2681 Unsteadiness on feet: Secondary | ICD-10-CM | POA: Diagnosis not present

## 2021-02-17 DIAGNOSIS — G8191 Hemiplegia, unspecified affecting right dominant side: Secondary | ICD-10-CM | POA: Diagnosis not present

## 2021-02-17 DIAGNOSIS — R296 Repeated falls: Secondary | ICD-10-CM | POA: Diagnosis not present

## 2021-02-17 DIAGNOSIS — R279 Unspecified lack of coordination: Secondary | ICD-10-CM | POA: Diagnosis not present

## 2021-02-17 DIAGNOSIS — M6281 Muscle weakness (generalized): Secondary | ICD-10-CM | POA: Diagnosis not present

## 2021-02-18 DIAGNOSIS — R2689 Other abnormalities of gait and mobility: Secondary | ICD-10-CM | POA: Diagnosis not present

## 2021-02-18 DIAGNOSIS — R296 Repeated falls: Secondary | ICD-10-CM | POA: Diagnosis not present

## 2021-02-18 DIAGNOSIS — G8191 Hemiplegia, unspecified affecting right dominant side: Secondary | ICD-10-CM | POA: Diagnosis not present

## 2021-02-18 DIAGNOSIS — H401112 Primary open-angle glaucoma, right eye, moderate stage: Secondary | ICD-10-CM | POA: Diagnosis not present

## 2021-02-18 DIAGNOSIS — R2681 Unsteadiness on feet: Secondary | ICD-10-CM | POA: Diagnosis not present

## 2021-02-21 DIAGNOSIS — R2689 Other abnormalities of gait and mobility: Secondary | ICD-10-CM | POA: Diagnosis not present

## 2021-02-21 DIAGNOSIS — R2681 Unsteadiness on feet: Secondary | ICD-10-CM | POA: Diagnosis not present

## 2021-02-21 DIAGNOSIS — G8191 Hemiplegia, unspecified affecting right dominant side: Secondary | ICD-10-CM | POA: Diagnosis not present

## 2021-02-22 DIAGNOSIS — R279 Unspecified lack of coordination: Secondary | ICD-10-CM | POA: Diagnosis not present

## 2021-02-22 DIAGNOSIS — M6281 Muscle weakness (generalized): Secondary | ICD-10-CM | POA: Diagnosis not present

## 2021-02-22 DIAGNOSIS — R296 Repeated falls: Secondary | ICD-10-CM | POA: Diagnosis not present

## 2021-02-22 DIAGNOSIS — G8191 Hemiplegia, unspecified affecting right dominant side: Secondary | ICD-10-CM | POA: Diagnosis not present

## 2021-02-23 DIAGNOSIS — R2681 Unsteadiness on feet: Secondary | ICD-10-CM | POA: Diagnosis not present

## 2021-02-23 DIAGNOSIS — R296 Repeated falls: Secondary | ICD-10-CM | POA: Diagnosis not present

## 2021-02-23 DIAGNOSIS — G8191 Hemiplegia, unspecified affecting right dominant side: Secondary | ICD-10-CM | POA: Diagnosis not present

## 2021-02-23 DIAGNOSIS — R279 Unspecified lack of coordination: Secondary | ICD-10-CM | POA: Diagnosis not present

## 2021-02-23 DIAGNOSIS — R2689 Other abnormalities of gait and mobility: Secondary | ICD-10-CM | POA: Diagnosis not present

## 2021-02-23 DIAGNOSIS — M6281 Muscle weakness (generalized): Secondary | ICD-10-CM | POA: Diagnosis not present

## 2021-02-24 ENCOUNTER — Telehealth: Payer: Self-pay | Admitting: Adult Health Nurse Practitioner

## 2021-02-24 DIAGNOSIS — R296 Repeated falls: Secondary | ICD-10-CM | POA: Diagnosis not present

## 2021-02-24 DIAGNOSIS — R279 Unspecified lack of coordination: Secondary | ICD-10-CM | POA: Diagnosis not present

## 2021-02-24 DIAGNOSIS — G8191 Hemiplegia, unspecified affecting right dominant side: Secondary | ICD-10-CM | POA: Diagnosis not present

## 2021-02-24 DIAGNOSIS — M6281 Muscle weakness (generalized): Secondary | ICD-10-CM | POA: Diagnosis not present

## 2021-02-24 NOTE — Telephone Encounter (Signed)
Called patient to offer to schedule a Palliative f/u visit, no answer - left message requesting a return call to schedule visit.

## 2021-02-25 DIAGNOSIS — G8191 Hemiplegia, unspecified affecting right dominant side: Secondary | ICD-10-CM | POA: Diagnosis not present

## 2021-02-25 DIAGNOSIS — R296 Repeated falls: Secondary | ICD-10-CM | POA: Diagnosis not present

## 2021-02-25 DIAGNOSIS — R2681 Unsteadiness on feet: Secondary | ICD-10-CM | POA: Diagnosis not present

## 2021-02-25 DIAGNOSIS — R2689 Other abnormalities of gait and mobility: Secondary | ICD-10-CM | POA: Diagnosis not present

## 2021-02-28 ENCOUNTER — Telehealth: Payer: Self-pay | Admitting: Adult Health Nurse Practitioner

## 2021-02-28 DIAGNOSIS — G8191 Hemiplegia, unspecified affecting right dominant side: Secondary | ICD-10-CM | POA: Diagnosis not present

## 2021-02-28 DIAGNOSIS — R279 Unspecified lack of coordination: Secondary | ICD-10-CM | POA: Diagnosis not present

## 2021-02-28 DIAGNOSIS — R296 Repeated falls: Secondary | ICD-10-CM | POA: Diagnosis not present

## 2021-02-28 DIAGNOSIS — M6281 Muscle weakness (generalized): Secondary | ICD-10-CM | POA: Diagnosis not present

## 2021-03-01 DIAGNOSIS — R2681 Unsteadiness on feet: Secondary | ICD-10-CM | POA: Diagnosis not present

## 2021-03-01 DIAGNOSIS — R2689 Other abnormalities of gait and mobility: Secondary | ICD-10-CM | POA: Diagnosis not present

## 2021-03-01 DIAGNOSIS — G8191 Hemiplegia, unspecified affecting right dominant side: Secondary | ICD-10-CM | POA: Diagnosis not present

## 2021-03-01 DIAGNOSIS — R296 Repeated falls: Secondary | ICD-10-CM | POA: Diagnosis not present

## 2021-03-01 NOTE — Telephone Encounter (Signed)
Spoke with patient and he was in agreement with scheduling an In-home Consult for 03/08/21 @ 1 PM.

## 2021-03-02 DIAGNOSIS — R296 Repeated falls: Secondary | ICD-10-CM | POA: Diagnosis not present

## 2021-03-02 DIAGNOSIS — E1165 Type 2 diabetes mellitus with hyperglycemia: Secondary | ICD-10-CM | POA: Diagnosis not present

## 2021-03-02 DIAGNOSIS — M6281 Muscle weakness (generalized): Secondary | ICD-10-CM | POA: Diagnosis not present

## 2021-03-02 DIAGNOSIS — R2681 Unsteadiness on feet: Secondary | ICD-10-CM | POA: Diagnosis not present

## 2021-03-02 DIAGNOSIS — G8191 Hemiplegia, unspecified affecting right dominant side: Secondary | ICD-10-CM | POA: Diagnosis not present

## 2021-03-02 DIAGNOSIS — R279 Unspecified lack of coordination: Secondary | ICD-10-CM | POA: Diagnosis not present

## 2021-03-02 DIAGNOSIS — R2689 Other abnormalities of gait and mobility: Secondary | ICD-10-CM | POA: Diagnosis not present

## 2021-03-03 DIAGNOSIS — M6281 Muscle weakness (generalized): Secondary | ICD-10-CM | POA: Diagnosis not present

## 2021-03-03 DIAGNOSIS — G8191 Hemiplegia, unspecified affecting right dominant side: Secondary | ICD-10-CM | POA: Diagnosis not present

## 2021-03-03 DIAGNOSIS — R296 Repeated falls: Secondary | ICD-10-CM | POA: Diagnosis not present

## 2021-03-03 DIAGNOSIS — R279 Unspecified lack of coordination: Secondary | ICD-10-CM | POA: Diagnosis not present

## 2021-03-04 DIAGNOSIS — R2689 Other abnormalities of gait and mobility: Secondary | ICD-10-CM | POA: Diagnosis not present

## 2021-03-04 DIAGNOSIS — R2681 Unsteadiness on feet: Secondary | ICD-10-CM | POA: Diagnosis not present

## 2021-03-04 DIAGNOSIS — G8191 Hemiplegia, unspecified affecting right dominant side: Secondary | ICD-10-CM | POA: Diagnosis not present

## 2021-03-04 DIAGNOSIS — R296 Repeated falls: Secondary | ICD-10-CM | POA: Diagnosis not present

## 2021-03-07 ENCOUNTER — Telehealth: Payer: Self-pay | Admitting: Adult Health Nurse Practitioner

## 2021-03-07 DIAGNOSIS — R279 Unspecified lack of coordination: Secondary | ICD-10-CM | POA: Diagnosis not present

## 2021-03-07 DIAGNOSIS — M6281 Muscle weakness (generalized): Secondary | ICD-10-CM | POA: Diagnosis not present

## 2021-03-07 DIAGNOSIS — G8191 Hemiplegia, unspecified affecting right dominant side: Secondary | ICD-10-CM | POA: Diagnosis not present

## 2021-03-07 DIAGNOSIS — R296 Repeated falls: Secondary | ICD-10-CM | POA: Diagnosis not present

## 2021-03-07 NOTE — Telephone Encounter (Signed)
Spoke with patient and confirmed appointment for tomorrow James Penick [name] NP 

## 2021-03-08 ENCOUNTER — Ambulatory Visit (INDEPENDENT_AMBULATORY_CARE_PROVIDER_SITE_OTHER): Payer: Medicare Other | Admitting: Family Medicine

## 2021-03-08 ENCOUNTER — Encounter: Payer: Self-pay | Admitting: Adult Health Nurse Practitioner

## 2021-03-08 ENCOUNTER — Other Ambulatory Visit: Payer: Self-pay

## 2021-03-08 ENCOUNTER — Other Ambulatory Visit: Payer: Medicare Other | Admitting: Adult Health Nurse Practitioner

## 2021-03-08 ENCOUNTER — Encounter: Payer: Self-pay | Admitting: Family Medicine

## 2021-03-08 VITALS — BP 128/62 | HR 74 | Temp 97.7°F | Ht 68.0 in | Wt 196.1 lb

## 2021-03-08 DIAGNOSIS — H6123 Impacted cerumen, bilateral: Secondary | ICD-10-CM | POA: Diagnosis not present

## 2021-03-08 DIAGNOSIS — Z7409 Other reduced mobility: Secondary | ICD-10-CM | POA: Diagnosis not present

## 2021-03-08 DIAGNOSIS — H6063 Unspecified chronic otitis externa, bilateral: Secondary | ICD-10-CM | POA: Diagnosis not present

## 2021-03-08 DIAGNOSIS — E1140 Type 2 diabetes mellitus with diabetic neuropathy, unspecified: Secondary | ICD-10-CM

## 2021-03-08 DIAGNOSIS — G8110 Spastic hemiplegia affecting unspecified side: Secondary | ICD-10-CM | POA: Diagnosis not present

## 2021-03-08 DIAGNOSIS — I69351 Hemiplegia and hemiparesis following cerebral infarction affecting right dominant side: Secondary | ICD-10-CM | POA: Diagnosis not present

## 2021-03-08 DIAGNOSIS — E1165 Type 2 diabetes mellitus with hyperglycemia: Secondary | ICD-10-CM | POA: Diagnosis not present

## 2021-03-08 DIAGNOSIS — S0990XA Unspecified injury of head, initial encounter: Secondary | ICD-10-CM | POA: Diagnosis not present

## 2021-03-08 DIAGNOSIS — W19XXXA Unspecified fall, initial encounter: Secondary | ICD-10-CM

## 2021-03-08 DIAGNOSIS — Z79899 Other long term (current) drug therapy: Secondary | ICD-10-CM | POA: Diagnosis not present

## 2021-03-08 DIAGNOSIS — Z515 Encounter for palliative care: Secondary | ICD-10-CM | POA: Diagnosis not present

## 2021-03-08 DIAGNOSIS — G2581 Restless legs syndrome: Secondary | ICD-10-CM

## 2021-03-08 DIAGNOSIS — R269 Unspecified abnormalities of gait and mobility: Secondary | ICD-10-CM | POA: Diagnosis not present

## 2021-03-08 DIAGNOSIS — IMO0002 Reserved for concepts with insufficient information to code with codable children: Secondary | ICD-10-CM

## 2021-03-08 NOTE — Progress Notes (Signed)
Patient ID: James Spurr, MD, male    DOB: [birth year], 87 y.o.   MRN: [id number]  This visit was conducted in person.  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 )   Upcoming in-home palliative care consult scheduled for this afternoon.   Recent move to [place]. Silver sneakers and PT/OT available at this facility. He notes improved strength with participation in PT. Previously lived in [place].   Intermittent diarrhea managed with imodium about once a day - hasn't recently needed antidiarrheal.   DM - sugars running elevated, pending f/u with endocrinology [name]). Using [employer] with benefit. Average sugar 172 based on CGM. Continues tresiba 90u at bedtime, ozempic 1mg  weekly, and humalog 20u with lunch  RLS - continued gabapentin 600mg  QID, recommend max dose 1200mg /day. Has had trouble tapering down due to worsening RLS - but it ounds like he's tried taper by cutting dose in half ie 300mg  QID. Remains off oxycodone, continues mirapex 1mg  daily. Describes akathisia, paresthesias that progresses to involuntary movements of feet, worse at night and keeps him awake. Also affect hands. Followed by [facility] neurology Jeraldine Loots) - next appt scheduled 07/2021. Would like establish locally with neurologist. Has found caffeine worsens RLS symptoms - better control when really limiting caffeine.   Tried diet supplement ?Lipozene to help him lose weight - did not tolerate well.   Had a fall 1 wk ago while transferring from bed to chair feet got tangled up. Landed on left shoulder and hit head on rug. Notes some mental haziness since fall as well as PT has mentioned he's weaker than prior to fall. Notes intermittent headache present since fall. No  nausea/vomiting. He is on plavix blood thinner.   S/p feraheme infusion x2 at [facility] 05/2020 Iron panel WNL, ferritin 300s 10/2020     Relevant past medical, surgical, family and social history reviewed and updated as indicated. Interim medical history since our last visit reviewed. Allergies and medications reviewed and updated. Outpatient Medications Prior to Visit  Medication Sig Dispense Refill   Alpha-Lipoic Acid 600 MG CAPS Take 1 capsule (600 mg total) by mouth daily. (Patient taking differently: Take 1 capsule by mouth in the morning and at bedtime.) 30 capsule 11   BD PEN NEEDLE NANO 2ND GEN 32G X 4 MM MISC USE TO INJECT MEDICATION DAILY 100 each 3   celecoxib (CELEBREX) 200 MG capsule Take 1 capsule (200 mg total) by mouth daily. (Patient taking differently: Take 200 mg by mouth daily as needed for mild pain.) 90 capsule 1   clopidogrel (PLAVIX) 75 MG tablet TAKE 1 TABLET DAILY 90 tablet 3   Coenzyme Q10 (COQ10) 150 MG CAPS Take 1 capsule by mouth daily.     diclofenac Sodium (VOLTAREN) 1 % GEL Apply 2 g topically 3 (three) times daily as needed. (Patient taking differently: Apply 2 g topically 3 (three) times daily as needed (pain).) 100 g 1   dorzolamide-timolol (COSOPT) 22.3-6.8 MG/ML ophthalmic solution      DULoxetine (CYMBALTA) 60 MG capsule TAKE 1 CAPSULE DAILY 90 capsule 3   famotidine (PEPCID) 20 MG tablet Take  1 tablet (20 mg total) by mouth at bedtime. (Patient taking differently: Take 20 mg by mouth daily. PRN)     gabapentin (NEURONTIN) 300 MG capsule Take 600 mg by mouth 3 (three) times daily. 600 mg BID is his minimum dose without having breakthrough symptoms. He is aware 900 mg/day is recommended per kidney function.     insulin degludec (TRESIBA FLEXTOUCH) 200 UNIT/ML FlexTouch Pen Inject 90 Units into the skin at bedtime. As needed     l-methylfolate-B6-B12 (METANX) 3-35-2 MG TABS tablet Take 1 tablet by mouth daily. 30 tablet 0   levothyroxine (SYNTHROID) 50 MCG tablet  Take 1 tablet (50 mcg total) by mouth daily before breakfast. Per endo [name]     LORazepam (ATIVAN) 0.5 MG tablet Take 1 tablet (0.5 mg total) by mouth at bedtime as needed for sleep. 30 tablet 0   LUMIGAN 0.01 % SOLN Place 1 drop into the right eye at bedtime.     melatonin 5 MG TABS Take 5 mg by mouth at bedtime.     metoprolol tartrate (LOPRESSOR) 25 MG tablet TAKE ONE-HALF (1/2) TABLET (12.5 MG) AT BEDTIME WITH MORNING DOSE IF NEEDED (Patient taking differently: Take 25 mg by mouth at bedtime. PRN palpitations) 90 tablet 3   nitroGLYCERIN (NITROSTAT) 0.4 MG SL tablet DISSOLVE 1 TABLET UNDER THE TONGUE EVERY 5 MINUTES AS NEEDED FOR CHEST PAIN (Patient taking differently: Place 0.4 mg under the tongue every 5 (five) minutes as needed for chest pain.) 25 tablet 1   Omega-3 Fatty Acids (FISH OIL) 1000 MG CAPS Take 1 capsule (1,000 mg total) by mouth in the morning and at bedtime.  0   ondansetron (ZOFRAN) 4 MG tablet Take 1 tablet (4 mg total) by mouth every 8 (eight) hours as needed for nausea or vomiting. 30 tablet 0   pramipexole (MIRAPEX) 1 MG tablet Take 1 tablet (1 mg total) by mouth See admin instructions. At 4pm 90 tablet 1   RABEprazole (ACIPHEX) 20 MG tablet TAKE 1 TABLET DAILY 90 tablet 3   rosuvastatin (CRESTOR) 40 MG tablet Take 1 tablet (40 mg total) by mouth daily. 90 tablet 3   Semaglutide, 1 MG/DOSE, (OZEMPIC, 1 MG/DOSE,) 2 MG/1.5ML SOPN Inject 1 mg into the skin every Saturday.     senna-docusate (SENOKOT-S) 8.6-50 MG tablet Take 1 tablet by mouth at bedtime. 30 tablet 0   tamsulosin (FLOMAX) 0.4 MG CAPS capsule Take 1 capsule (0.4 mg total) by mouth daily after breakfast. 90 capsule 1   valsartan (DIOVAN) 40 MG tablet Take 0.5 tablets (20 mg total) by mouth daily. 45 tablet 3   vitamin C (ASCORBIC ACID) 500 MG tablet Take 1,000 mg by mouth daily as needed.     insulin lispro (HUMALOG) 100 UNIT/ML KwikPen Inject 4 Units into the skin daily at 12 noon. With largest meal; Sliding  Scale prior to meals if needed     insulin lispro (HUMALOG) 100 UNIT/ML KwikPen Inject 20 Units into the skin daily at 12 noon. With largest meal (lunch) as well as occasionally with dinner; Sliding Scale prior to meals if needed     insulin lispro (HUMALOG) 100 UNIT/ML KwikPen Inject 20 Units into the skin daily at 12 noon. With largest meal; Sliding Scale prior to meals if needed     No facility-administered medications prior to visit.     Per HPI unless specifically indicated in ROS section below Review of Systems Objective:  BP 128/62   Pulse 74   Temp  97.7 F (36.5 C) (Temporal)   Ht 5\' 8"  (1.727 m)   Wt 196 lb 2 oz (89 kg)   SpO2 97%   BMI 29.82 kg/m   Wt Readings from Last 3 Encounters:  03/08/21 196 lb 2 oz (89 kg)  11/30/20 190 lb (86.2 kg)  11/15/20 188 lb (85.3 kg)      Physical Exam Vitals and nursing note reviewed.  Constitutional:      Appearance: Normal appearance. He is not ill-appearing.  Eyes:     Extraocular Movements: Extraocular movements intact.  Neck:     Thyroid: No thyroid mass or thyromegaly.  Cardiovascular:     Rate and Rhythm: Normal rate and regular rhythm.     Pulses: Normal pulses.     Heart sounds: Normal heart sounds. No murmur heard. Pulmonary:     Effort: Pulmonary effort is normal. No respiratory distress.     Breath sounds: Normal breath sounds. No wheezing, rhonchi or rales.  Musculoskeletal:     Cervical back: Normal range of motion and neck supple. No rigidity.     Right lower leg: No edema.     Left lower leg: No edema.  Lymphadenopathy:     Cervical: No cervical adenopathy.  Skin:    General: Skin is warm and dry.     Findings: No rash.  Neurological:     Mental Status: He is alert.     Comments:  CN 2-12 intact FTN intact EOMI Chronic R hemiparesis Ambulates with rolling walker  Psychiatric:        Mood and Affect: Mood normal.        Behavior: Behavior normal.      Results for orders placed or performed in  visit on 11/30/20  HM DIABETES EYE EXAM  Result Value Ref Range   HM Diabetic Eye Exam No Retinopathy No Retinopathy   *Note: Due to a large number of results and/or encounters for the requested time period, some results have not been displayed. A complete set of results can be found in Results Review.   Assessment & Plan:  This visit occurred during the SARS-CoV-2 public health emergency.  Safety protocols were in place, including screening questions prior to the visit, additional usage of staff PPE, and extensive cleaning of exam room while observing appropriate contact time as indicated for disinfecting solutions.   Problem List Items Addressed This Visit     Hemiparesis affecting right side as late effect of stroke [facility])    Anticipate will benefit from available PT at [facility].IL[facility].        Type 2 diabetes mellitus, uncontrolled, with neuropathy [facility])    Regularly sees endocrinology - appreciate Dr Cindra Eves care.        Relevant Medications   insulin lispro (HUMALOG) 100 UNIT/ML KwikPen   Restless leg syndrome    Now off opiate. Continues mirapex 1mg  nightly.  Continues high dose of gabapentin at 600mg  QID (too high for kidney function) - do recommend continued taper as per below.  He has seen Dr Jeraldine Loots at [facility] for RLS - but he requests to establish with local neurologist. Will place new referral.        Relevant Orders   Ambulatory referral to Neurology   Impaired mobility   Cause of injury, fall - Primary    Recent mechanical fall at home while transfering from bed to chairf, landed on L shoulder (now better) but also hit head on rug, with residual intermittent headaches since then. nonfocal  neurological exam. Given blood thinner use and persistent headache after fall, will check head imaging r/o subdural or other bleed.        Relevant Orders   CT Head Wo Contrast   Polypharmacy    Off oxycodone. Recommend slow taper of gabapentin by 300mg  every few days as per  instructions.        Other Visit Diagnoses     Injury of head, initial encounter       Relevant Orders   CT Head Wo Contrast        No orders of the defined types were placed in this encounter.  Orders Placed This Encounter  Procedures   CT Head Wo Contrast    Standing Status:   Future    Standing Expiration Date:   03/08/2022    Order Specific Question:   Preferred imaging location?    Answer:   [facility]   Ambulatory referral to Neurology    Referral Priority:   Routine    Referral Type:   Consultation    Referral Reason:   Specialty Services Required    Requested Specialty:   Neurology    Number of Visits Requested:   1    Patient Instructions  Stay off caffeine as much as you can to better control restless legs.  Slowly taper down on gabapentin dose - may try 300/600/600/600 for 1 week then drop to 300/300/600/600 for 1 week and continue taper that way until you're down to 300mg  four times a day.  We will order head CT scan after recent fall and refer you to local neurology for RLS.  Return as needed or in 3 months for wellness visit    Follow up plan: Return in about 3 months (around 06/08/2021), or if symptoms worsen or fail to improve, for annual exam, prior fasting for blood work, medicare wellness visit.  [name], MD

## 2021-03-08 NOTE — Patient Instructions (Addendum)
Stay off caffeine as much as you can to better control restless legs.  Slowly taper down on gabapentin dose - may try 300/600/600/600 for 1 week then drop to 300/300/600/600 for 1 week and continue taper that way until you're down to 300mg  four times a day.  We will order head CT scan after recent fall and refer you to local neurology for RLS.  Return as needed or in 3 months for wellness visit

## 2021-03-08 NOTE — Progress Notes (Signed)
[facility] Consult Note Telephone: [phone]  Fax: [phone]    Date of encounter: 03/08/21 PATIENT NAME: [name], MD [address] [name] [address]   [phone] (home)  DOB: 30-Dec-1930 MRN: [id number] PRIMARY CARE PROVIDER:    [name], MD,  [name] [address] [phone]  REFERRING PROVIDER:   [name], MD [address] [place],  [place] [address] [phone]  RESPONSIBLE PARTY:    Contact Information     Name Relation Home Work Mobile   [place] [phone]   [name] Daughter [phone]        I met face to face with patient in home. Palliative Care was asked to follow this patient by consultation request of  [name], MD to address advance care planning and complex medical decision making. This is the initial visit.                                     ASSESSMENT AND PLAN / RECOMMENDATIONS:   Advance Care Planning/Goals of Care: Goals include to maximize quality of life and symptom management.  CODE STATUS:  Wants to have CPR for just one round and if unsuccessful to pass naturally  Symptom Management/Plan:  Right side hemiparesis s/p CVA/impaired mobility: Patient is adjusting well to his new home.  He is still fairly independent of ADLs and still drives himself to doctors appointments.  Currently is stable.  Continue PT/OT as ordered   Follow up Palliative Care Visit: Palliative care will continue to follow for complex medical decision making, advance care planning, and clarification of goals. Prefers to have check in every couple of months.  Encouraged to call with any questions or concerns.  I spent 45 minutes providing this consultation. More than 50% of the time in this consultation was spent in counseling and care coordination.  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  Patient has recently moved from living alone at home and is now an independent living apartments at [facility].  States that the move has been an adjustment but he is settling in well.  Patient has no new complaints today and states that he is doing well.  Denies pain, headaches, dizziness, SOB, cough, chest pain, N/V/D, constipation, dysuria, hematuria.  Does state that he did recently have a fall with no injuries noted.  Patient does state that he gets 3 meals a day prepared by the facility and that someone comes in and cleans his apartment once a week.  States his appetite is good with no weight changes noted.  Patient did have hospitalization 2/15 through 11/05/2020 for altered mental status with diplopia.  At that time UA was negative, chest x-ray was negative for any active disease, MRI of brain showed no acute infarct.  Was believed that episode may have been related to polypharmacy and dehydration as he was recently started on oxycodone for RLS.  The oxycodone was stopped and gabapentin reduced.  Patient gets around mostly in a motorized wheelchair.  He does state that he tries to walk short distances with his walker.  He is currently working  with PT/OT.  He is able to perform his ADLs independently.  History obtained from review of EMR and interview with  Mr. Primo.  I reviewed available labs, medications, imaging, studies and related documents from the EMR.  Records reviewed and summarized above.    Physical Exam:  Constitutional: NAD General: WNWD EYES: anicteric sclera, lids intact, no discharge  ENMT: intact hearing, oral mucous membranes moist CV: S1S2, no LE edema Pulmonary: no increased work of breathing, no cough MSK: moves all extremities,  ambulatory with walker for short distances Skin:  no rashes on visible skin Neuro:   A and O x 3 Hem/lymph/immuno: no widespread bruising   CURRENT PROBLEM LIST:  Patient Active Problem List   Diagnosis Date Noted   Acute encephalopathy 11/02/2020   Dysuria 10/18/2020   Healthcare maintenance 06/01/2020   Involuntary movements 04/25/2020   Post-nasal drainage 07/16/2019   Recurrent productive cough 05/31/2019   Hx of basal cell carcinoma 07/10/2017   Rosacea    Gait disturbance, post-stroke 10/05/2015   Presbycusis of both ears    Advanced care planning/counseling discussion 06/23/2015   Impaired mobility 04/23/2015   Hearing loss of right ear due to cerumen impaction 03/23/2015   HTN (hypertension) 87/56/4332   Diastolic dysfunction 95/18/8416   Spastic hemiparesis affecting dominant side ([place]) 07/07/2014   Chest pressure 02/03/2014   Medicare annual wellness visit, subsequent 11/06/2013   Erectile dysfunction due to arterial insufficiency 02/05/2013   MDD (major depressive disorder), recurrent episode, moderate ([place])    Palpitations 06/26/2012   Hyperlipidemia associated with type 2 diabetes mellitus ([place])    Restless leg syndrome    GERD (gastroesophageal reflux disease)    BPH with obstruction/lower urinary tract symptoms    History of CVA (cerebrovascular accident) 03/11/2012   Hemiparesis affecting right side as late effect of stroke ([place]) 03/11/2012   Type 2 diabetes mellitus, uncontrolled, with neuropathy ([place]) 03/11/2012   OSA on CPAP 03/11/2012   CKD stage 3 secondary to diabetes ([place]) 03/11/2012   Migraines 03/11/2012   Thrombocytopenia ([place]) 2011   PAST MEDICAL HISTORY:  Active Ambulatory Problems    Diagnosis Date Noted   History of CVA (cerebrovascular accident) 03/11/2012   Hemiparesis affecting right side as late effect of stroke ([place]) 03/11/2012   Type 2 diabetes mellitus, uncontrolled, with neuropathy ([place]) 03/11/2012   OSA on CPAP 03/11/2012   CKD  stage 3 secondary to diabetes ([place]) 03/11/2012   Hyperlipidemia associated with type 2 diabetes mellitus ([place])    Restless leg syndrome    GERD (gastroesophageal reflux disease)    Migraines 03/11/2012   BPH with obstruction/lower urinary tract symptoms    Palpitations 06/26/2012   MDD (major depressive disorder), recurrent episode, moderate ([place])    Erectile dysfunction due to arterial insufficiency 02/05/2013   Medicare annual wellness visit, subsequent 11/06/2013   Chest pressure 02/03/2014   Spastic hemiparesis affecting dominant side ([place]) 07/07/2014   HTN (hypertension) 60/63/0160   Diastolic dysfunction 10/93/2355   Hearing loss of right ear due to cerumen impaction 03/23/2015   Impaired mobility 04/23/2015   Advanced care planning/counseling discussion 06/23/2015   Presbycusis of both ears    Gait disturbance, post-stroke 10/05/2015   Rosacea    Hx of basal cell carcinoma 07/10/2017   Recurrent productive cough 05/31/2019   Post-nasal drainage 07/16/2019   Involuntary movements 04/25/2020   Healthcare maintenance 06/01/2020   Thrombocytopenia ([place]) 2011   Dysuria 10/18/2020   Acute encephalopathy 11/02/2020   Resolved Ambulatory Problems  Diagnosis Date Noted   Medicare annual wellness visit, initial 07/07/2012   Dark stools 10/05/2012   Oral lesion 11/06/2013   Left shoulder pain 03/16/2014   Right leg swelling 06/09/2014   Acute right-sided weakness 03/18/2015   External nasal lesion 06/23/2015   Skin lesion 06/23/2015   Dry cough 10/25/2015   Left wrist pain 01/15/2018   Headache 10/30/2018   Acute respiratory infection 02/26/2019   Aphasia as late effect of stroke 03/11/2019   Cerebral artery occlusion with cerebral infarction ([place]) 03/11/2012   Hx of colonic polyps 03/11/2019   Hypertensive renal disease 03/11/2019   Hypothyroidism 03/11/2019   Iron deficiency anemia due to chronic blood loss 03/11/2019   Polyneuropathy due to type 2 diabetes mellitus  ([place]) 03/11/2019   Weight loss 03/11/2019   Other chest pain 02/03/2014   Familial multiple lipoprotein-type hyperlipidemia 01/19/2015   Shoulder joint pain 03/16/2014   Fever 07/02/2020   Type 2 diabetes, controlled, with neuropathy [facility]) 2011   Hyponatremia 2011   Fall 2011   Hearing loss of left ear due to cerumen impaction 10/18/2020   Nausea 10/18/2020   Past Medical History:  Diagnosis Date   Anemia    Basal cell carcinoma 2018   Basal cell carcinoma of nose 2002   CKD (chronic kidney disease) stage 3, GFR 30-59 ml/min ([place]) 03/11/2012   CVA (cerebral infarction) 03/07/12   Depression    Erectile dysfunction    H/O hiatal hernia    History of kidney problems 12/2010   HLD (hyperlipidemia)    Kidney cysts 02/2014   Stroke ([place])    TIA (transient ischemic attack)    SOCIAL HX:  Social History   Tobacco Use   Smoking status: Never   Smokeless tobacco: Never   Tobacco comments:    "used to smoke pipe when I was younger"  Substance Use Topics   Alcohol use: Yes    Alcohol/week: 4.0 standard drinks    Types: 2 Glasses of wine, 2 Shots of liquor per week    Comment: occasional   FAMILY HX:  Family History  Problem Relation Age of Onset   Stroke Father    Lung cancer Father 99        smoker   Stroke Brother    Diabetes Brother 64   Lung cancer Brother    Alcoholism Sister    Kidney disease Sister    Coronary artery disease Neg Hx       ALLERGIES:  Allergies  Allergen Reactions   Crestor [Rosuvastatin]     Other reaction(s): muscle pain 2.16.2022 Pt is current on this medication without any issues.   Dulaglutide     Other reaction(s): upset stomach   Empagliflozin     Other reaction(s): yeast infection   Gabapentin Other (See Comments)    At high doses - BLURRED VISION, BALLANCE, CONCENTRATION PROBLEMS, RESTLESSNESS, NOT SLEEPING, ? LOST OF STRENGTH, UNSTEADINESS   Lisinopril Cough    tachycardia   Nexium [Esomeprazole] Other (See Comments)    Headache    Niacin And Related Other (See Comments)    flushing   Oxycodone Other (See Comments)    Even 1/2 tablet of 69m caused increased confusion   Pregabalin     Other reaction(s): MALAISE, SLIGHT NAUSEA, SLIGHT HEADACHE     PERTINENT MEDICATIONS:  Outpatient Encounter Medications as of 03/08/2021  Medication Sig   Alpha-Lipoic Acid 600 MG CAPS Take 1 capsule (600 mg total) by mouth daily. (Patient taking differently: Take 1  capsule by mouth in the morning and at bedtime.)   BD PEN NEEDLE NANO 2ND GEN 32G X 4 MM MISC USE TO INJECT MEDICATION DAILY   celecoxib (CELEBREX) 200 MG capsule Take 1 capsule (200 mg total) by mouth daily. (Patient taking differently: Take 200 mg by mouth daily as needed for mild pain.)   clopidogrel (PLAVIX) 75 MG tablet TAKE 1 TABLET DAILY   Coenzyme Q10 (COQ10) 150 MG CAPS Take 1 capsule by mouth daily.   diclofenac Sodium (VOLTAREN) 1 % GEL Apply 2 g topically 3 (three) times daily as needed. (Patient taking differently: Apply 2 g topically 3 (three) times daily as needed (pain).)   dorzolamide-timolol (COSOPT) 22.3-6.8 MG/ML ophthalmic solution    DULoxetine (CYMBALTA) 60 MG capsule TAKE 1 CAPSULE DAILY   famotidine (PEPCID) 20 MG tablet Take 1 tablet (20 mg total) by mouth at bedtime. (Patient taking differently: Take 20 mg by mouth daily. PRN)   gabapentin (NEURONTIN) 300 MG capsule Take 600 mg by mouth 3 (three) times daily. 600 mg BID is his minimum dose without having breakthrough symptoms. He is aware 900 mg/day is recommended per kidney function.   insulin degludec (TRESIBA FLEXTOUCH) 200 UNIT/ML FlexTouch Pen Inject 90 Units into the skin at bedtime. As needed   insulin lispro (HUMALOG) 100 UNIT/ML KwikPen Inject 20 Units into the skin daily at 12 noon. With largest meal (lunch) as well as occasionally with dinner; Sliding Scale prior to meals if needed   l-methylfolate-B6-B12 (METANX) 3-35-2 MG TABS tablet Take 1 tablet by mouth daily.   levothyroxine  (SYNTHROID) 50 MCG tablet Take 1 tablet (50 mcg total) by mouth daily before breakfast. Per endo [name]   LORazepam (ATIVAN) 0.5 MG tablet Take 1 tablet (0.5 mg total) by mouth at bedtime as needed for sleep.   LUMIGAN 0.01 % SOLN Place 1 drop into the right eye at bedtime.   melatonin 5 MG TABS Take 5 mg by mouth at bedtime.   metoprolol tartrate (LOPRESSOR) 25 MG tablet TAKE ONE-HALF (1/2) TABLET (12.5 MG) AT BEDTIME WITH MORNING DOSE IF NEEDED (Patient taking differently: Take 25 mg by mouth at bedtime. PRN palpitations)   nitroGLYCERIN (NITROSTAT) 0.4 MG SL tablet DISSOLVE 1 TABLET UNDER THE TONGUE EVERY 5 MINUTES AS NEEDED FOR CHEST PAIN (Patient taking differently: Place 0.4 mg under the tongue every 5 (five) minutes as needed for chest pain.)   Omega-3 Fatty Acids (FISH OIL) 1000 MG CAPS Take 1 capsule (1,000 mg total) by mouth in the morning and at bedtime.   ondansetron (ZOFRAN) 4 MG tablet Take 1 tablet (4 mg total) by mouth every 8 (eight) hours as needed for nausea or vomiting.   pramipexole (MIRAPEX) 1 MG tablet Take 1 tablet (1 mg total) by mouth See admin instructions. At 4pm   RABEprazole (ACIPHEX) 20 MG tablet TAKE 1 TABLET DAILY   rosuvastatin (CRESTOR) 40 MG tablet Take 1 tablet (40 mg total) by mouth daily.   Semaglutide, 1 MG/DOSE, (OZEMPIC, 1 MG/DOSE,) 2 MG/1.5ML SOPN Inject 1 mg into the skin every Saturday.   senna-docusate (SENOKOT-S) 8.6-50 MG tablet Take 1 tablet by mouth at bedtime.   tamsulosin (FLOMAX) 0.4 MG CAPS capsule Take 1 capsule (0.4 mg total) by mouth daily after breakfast.   valsartan (DIOVAN) 40 MG tablet Take 0.5 tablets (20 mg total) by mouth daily.   vitamin C (ASCORBIC ACID) 500 MG tablet Take 1,000 mg by mouth daily as needed.   No facility-administered encounter medications on  file as of 03/08/2021.    Thank you for the opportunity to participate in the care of Mr. Vicario.  The palliative care team will continue to follow. Please call our office at  [phone] if we can be of additional assistance.   Kazi Montoro Jenetta Downer, NP , DNP  This chart was dictated using voice recognition software.  Despite best efforts to proofread,  errors can occur which can change the documentation meaning.   COVID-19 PATIENT SCREENING TOOL Asked and negative response unless otherwise noted:   Have you had symptoms of covid, tested positive or been in contact with someone with symptoms/positive test in the past 5-10 day

## 2021-03-09 DIAGNOSIS — G8191 Hemiplegia, unspecified affecting right dominant side: Secondary | ICD-10-CM | POA: Diagnosis not present

## 2021-03-09 DIAGNOSIS — R296 Repeated falls: Secondary | ICD-10-CM | POA: Diagnosis not present

## 2021-03-09 DIAGNOSIS — R279 Unspecified lack of coordination: Secondary | ICD-10-CM | POA: Diagnosis not present

## 2021-03-09 DIAGNOSIS — M6281 Muscle weakness (generalized): Secondary | ICD-10-CM | POA: Diagnosis not present

## 2021-03-09 DIAGNOSIS — R2681 Unsteadiness on feet: Secondary | ICD-10-CM | POA: Diagnosis not present

## 2021-03-09 DIAGNOSIS — R2689 Other abnormalities of gait and mobility: Secondary | ICD-10-CM | POA: Diagnosis not present

## 2021-03-10 DIAGNOSIS — G8191 Hemiplegia, unspecified affecting right dominant side: Secondary | ICD-10-CM | POA: Diagnosis not present

## 2021-03-10 DIAGNOSIS — R296 Repeated falls: Secondary | ICD-10-CM | POA: Diagnosis not present

## 2021-03-10 DIAGNOSIS — R2689 Other abnormalities of gait and mobility: Secondary | ICD-10-CM | POA: Diagnosis not present

## 2021-03-10 DIAGNOSIS — R279 Unspecified lack of coordination: Secondary | ICD-10-CM | POA: Diagnosis not present

## 2021-03-10 DIAGNOSIS — R2681 Unsteadiness on feet: Secondary | ICD-10-CM | POA: Diagnosis not present

## 2021-03-10 DIAGNOSIS — Z79899 Other long term (current) drug therapy: Secondary | ICD-10-CM | POA: Insufficient documentation

## 2021-03-10 DIAGNOSIS — M6281 Muscle weakness (generalized): Secondary | ICD-10-CM | POA: Diagnosis not present

## 2021-03-10 NOTE — Assessment & Plan Note (Addendum)
Now off opiate. Continues mirapex 1mg  nightly.  Continues high dose of gabapentin at 600mg  QID (too high for kidney function) - do recommend continued taper as per below.  He has seen Dr Jeraldine Loots at [facility] for RLS - but he requests to establish with local neurologist. Will place new referral.

## 2021-03-10 NOTE — Assessment & Plan Note (Signed)
Anticipate will benefit from available PT at [facility].ILF.

## 2021-03-10 NOTE — Assessment & Plan Note (Signed)
Off oxycodone. Recommend slow taper of gabapentin by 300mg  every few days as per instructions.

## 2021-03-10 NOTE — Assessment & Plan Note (Signed)
Recent mechanical fall at home while transfering from bed to chairf, landed on L shoulder (now better) but also hit head on rug, with residual intermittent headaches since then. nonfocal neurological exam. Given blood thinner use and persistent headache after fall, will check head imaging r/o subdural or other bleed.

## 2021-03-10 NOTE — Assessment & Plan Note (Signed)
Regularly sees endocrinology - appreciate Dr Cindra Eves care.

## 2021-03-11 DIAGNOSIS — R2681 Unsteadiness on feet: Secondary | ICD-10-CM | POA: Diagnosis not present

## 2021-03-11 DIAGNOSIS — R296 Repeated falls: Secondary | ICD-10-CM | POA: Diagnosis not present

## 2021-03-11 DIAGNOSIS — G8191 Hemiplegia, unspecified affecting right dominant side: Secondary | ICD-10-CM | POA: Diagnosis not present

## 2021-03-11 DIAGNOSIS — R2689 Other abnormalities of gait and mobility: Secondary | ICD-10-CM | POA: Diagnosis not present

## 2021-03-14 ENCOUNTER — Telehealth: Payer: Self-pay

## 2021-03-14 ENCOUNTER — Telehealth: Payer: Self-pay | Admitting: Family Medicine

## 2021-03-14 DIAGNOSIS — R279 Unspecified lack of coordination: Secondary | ICD-10-CM | POA: Diagnosis not present

## 2021-03-14 DIAGNOSIS — G8191 Hemiplegia, unspecified affecting right dominant side: Secondary | ICD-10-CM | POA: Diagnosis not present

## 2021-03-14 DIAGNOSIS — M6281 Muscle weakness (generalized): Secondary | ICD-10-CM | POA: Diagnosis not present

## 2021-03-14 DIAGNOSIS — R296 Repeated falls: Secondary | ICD-10-CM | POA: Diagnosis not present

## 2021-03-14 NOTE — Telephone Encounter (Signed)
LVM asking pt to bring in CPAP and power cable to appt tomorrow

## 2021-03-14 NOTE — Telephone Encounter (Signed)
Pt is wanting to know if Dr Darnell Level feels that he should get the booster covid vaccine.  Given he had a reaction to the first vaccine he is hesitant to get the booster.   Wants to know Dr Synthia Innocent thoughts.   Please advise, thanks.

## 2021-03-15 ENCOUNTER — Encounter: Payer: Self-pay | Admitting: Adult Health

## 2021-03-15 ENCOUNTER — Ambulatory Visit: Payer: Medicare Other | Admitting: Adult Health

## 2021-03-15 DIAGNOSIS — R296 Repeated falls: Secondary | ICD-10-CM | POA: Diagnosis not present

## 2021-03-15 DIAGNOSIS — R2689 Other abnormalities of gait and mobility: Secondary | ICD-10-CM | POA: Diagnosis not present

## 2021-03-15 DIAGNOSIS — R2681 Unsteadiness on feet: Secondary | ICD-10-CM | POA: Diagnosis not present

## 2021-03-15 DIAGNOSIS — G8191 Hemiplegia, unspecified affecting right dominant side: Secondary | ICD-10-CM | POA: Diagnosis not present

## 2021-03-15 DIAGNOSIS — M7062 Trochanteric bursitis, left hip: Secondary | ICD-10-CM | POA: Diagnosis not present

## 2021-03-15 NOTE — Telephone Encounter (Signed)
Can he remind me what symptoms he had after first booster? Was it just fever or were there other symptoms as well (confusion, body aches, chest pain, etc)?  If just fever after booster, I think reasonable to get another one for extra protection, could take tylenol with the booster. If felt really bad, would recommend not get 2nd booster.

## 2021-03-15 NOTE — Telephone Encounter (Signed)
Lvm asking pt to call back.  Need to get answers to Dr. Synthia Innocent questions and relay Dr. Synthia Innocent message.

## 2021-03-16 DIAGNOSIS — G8191 Hemiplegia, unspecified affecting right dominant side: Secondary | ICD-10-CM | POA: Diagnosis not present

## 2021-03-16 DIAGNOSIS — R2681 Unsteadiness on feet: Secondary | ICD-10-CM | POA: Diagnosis not present

## 2021-03-16 DIAGNOSIS — R2689 Other abnormalities of gait and mobility: Secondary | ICD-10-CM | POA: Diagnosis not present

## 2021-03-16 NOTE — Telephone Encounter (Signed)
Lvm asking pt to call back.  Need to get answers to Dr. Synthia Innocent questions and relay Dr. Synthia Innocent message.

## 2021-03-17 DIAGNOSIS — G8191 Hemiplegia, unspecified affecting right dominant side: Secondary | ICD-10-CM | POA: Diagnosis not present

## 2021-03-17 DIAGNOSIS — R2689 Other abnormalities of gait and mobility: Secondary | ICD-10-CM | POA: Diagnosis not present

## 2021-03-17 DIAGNOSIS — R2681 Unsteadiness on feet: Secondary | ICD-10-CM | POA: Diagnosis not present

## 2021-03-17 DIAGNOSIS — R279 Unspecified lack of coordination: Secondary | ICD-10-CM | POA: Diagnosis not present

## 2021-03-17 DIAGNOSIS — M6281 Muscle weakness (generalized): Secondary | ICD-10-CM | POA: Diagnosis not present

## 2021-03-17 DIAGNOSIS — R296 Repeated falls: Secondary | ICD-10-CM | POA: Diagnosis not present

## 2021-03-17 NOTE — Telephone Encounter (Signed)
Lvm asking pt to call back.  Need to get answers to Dr. Synthia Innocent questions and relay Dr. Synthia Innocent message.

## 2021-03-18 ENCOUNTER — Other Ambulatory Visit: Payer: Medicare Other

## 2021-03-18 DIAGNOSIS — R296 Repeated falls: Secondary | ICD-10-CM | POA: Diagnosis not present

## 2021-03-18 DIAGNOSIS — R279 Unspecified lack of coordination: Secondary | ICD-10-CM | POA: Diagnosis not present

## 2021-03-18 DIAGNOSIS — G8191 Hemiplegia, unspecified affecting right dominant side: Secondary | ICD-10-CM | POA: Diagnosis not present

## 2021-03-18 DIAGNOSIS — M6281 Muscle weakness (generalized): Secondary | ICD-10-CM | POA: Diagnosis not present

## 2021-03-21 DIAGNOSIS — G8191 Hemiplegia, unspecified affecting right dominant side: Secondary | ICD-10-CM | POA: Diagnosis not present

## 2021-03-21 DIAGNOSIS — M6281 Muscle weakness (generalized): Secondary | ICD-10-CM | POA: Diagnosis not present

## 2021-03-21 DIAGNOSIS — R279 Unspecified lack of coordination: Secondary | ICD-10-CM | POA: Diagnosis not present

## 2021-03-22 ENCOUNTER — Other Ambulatory Visit: Payer: Self-pay

## 2021-03-22 ENCOUNTER — Ambulatory Visit
Admission: RE | Admit: 2021-03-22 | Discharge: 2021-03-22 | Disposition: A | Discharge status: Home / Self Care | Payer: Medicare Other | Source: Ambulatory Visit | Attending: Family Medicine | Admitting: Family Medicine

## 2021-03-22 DIAGNOSIS — M6281 Muscle weakness (generalized): Secondary | ICD-10-CM | POA: Diagnosis not present

## 2021-03-22 DIAGNOSIS — R279 Unspecified lack of coordination: Secondary | ICD-10-CM | POA: Diagnosis not present

## 2021-03-22 DIAGNOSIS — W19XXXA Unspecified fall, initial encounter: Secondary | ICD-10-CM | POA: Insufficient documentation

## 2021-03-22 DIAGNOSIS — S0990XA Unspecified injury of head, initial encounter: Secondary | ICD-10-CM | POA: Diagnosis not present

## 2021-03-22 DIAGNOSIS — Y9289 Other specified places as the place of occurrence of the external cause: Secondary | ICD-10-CM | POA: Insufficient documentation

## 2021-03-22 DIAGNOSIS — R519 Headache, unspecified: Secondary | ICD-10-CM | POA: Diagnosis not present

## 2021-03-22 DIAGNOSIS — Y939 Activity, unspecified: Secondary | ICD-10-CM | POA: Diagnosis not present

## 2021-03-22 DIAGNOSIS — G8191 Hemiplegia, unspecified affecting right dominant side: Secondary | ICD-10-CM | POA: Diagnosis not present

## 2021-03-22 DIAGNOSIS — I6782 Cerebral ischemia: Secondary | ICD-10-CM | POA: Insufficient documentation

## 2021-03-23 DIAGNOSIS — G8191 Hemiplegia, unspecified affecting right dominant side: Secondary | ICD-10-CM | POA: Diagnosis not present

## 2021-03-23 DIAGNOSIS — R2681 Unsteadiness on feet: Secondary | ICD-10-CM | POA: Diagnosis not present

## 2021-03-23 DIAGNOSIS — R2689 Other abnormalities of gait and mobility: Secondary | ICD-10-CM | POA: Diagnosis not present

## 2021-03-23 DIAGNOSIS — R296 Repeated falls: Secondary | ICD-10-CM | POA: Diagnosis not present

## 2021-03-23 NOTE — Telephone Encounter (Signed)
Patient was called and given the recommendations from Dr. Danise Mina, patient stated that the timing of the booster and hospital visit were super close. Patient stated that he has been having for the past three weeks now diarrhea and can't seem to get it to stop and has tried imodium but it didn't help. Please advise.

## 2021-03-24 DIAGNOSIS — R2681 Unsteadiness on feet: Secondary | ICD-10-CM | POA: Diagnosis not present

## 2021-03-24 DIAGNOSIS — G8191 Hemiplegia, unspecified affecting right dominant side: Secondary | ICD-10-CM | POA: Diagnosis not present

## 2021-03-24 DIAGNOSIS — R2689 Other abnormalities of gait and mobility: Secondary | ICD-10-CM | POA: Diagnosis not present

## 2021-03-24 NOTE — Telephone Encounter (Signed)
Any fever, abd pain, blood in stool? How may times a day is he having a stool? Recommend OV if ongoing diarrhea for further evaluation. Marland Kitchen

## 2021-03-25 DIAGNOSIS — R2689 Other abnormalities of gait and mobility: Secondary | ICD-10-CM | POA: Diagnosis not present

## 2021-03-25 DIAGNOSIS — M6281 Muscle weakness (generalized): Secondary | ICD-10-CM | POA: Diagnosis not present

## 2021-03-25 DIAGNOSIS — G8191 Hemiplegia, unspecified affecting right dominant side: Secondary | ICD-10-CM | POA: Diagnosis not present

## 2021-03-25 DIAGNOSIS — R296 Repeated falls: Secondary | ICD-10-CM | POA: Diagnosis not present

## 2021-03-25 DIAGNOSIS — H401131 Primary open-angle glaucoma, bilateral, mild stage: Secondary | ICD-10-CM | POA: Diagnosis not present

## 2021-03-25 DIAGNOSIS — R2681 Unsteadiness on feet: Secondary | ICD-10-CM | POA: Diagnosis not present

## 2021-03-25 DIAGNOSIS — R279 Unspecified lack of coordination: Secondary | ICD-10-CM | POA: Diagnosis not present

## 2021-03-25 NOTE — Telephone Encounter (Signed)
Lvm asking pt to call back.  Need to get answers to Dr. Synthia Innocent questions and schedule OV accordingly.   Also per Dr. Darnell Level, received notice from [facility] - they will not see pt for RLS and recommend he continue seeing [facility] or referral to [facility] neurology.  Is pt interested in referral to [facility]?

## 2021-03-28 DIAGNOSIS — R296 Repeated falls: Secondary | ICD-10-CM | POA: Diagnosis not present

## 2021-03-28 DIAGNOSIS — R279 Unspecified lack of coordination: Secondary | ICD-10-CM | POA: Diagnosis not present

## 2021-03-28 DIAGNOSIS — G8191 Hemiplegia, unspecified affecting right dominant side: Secondary | ICD-10-CM | POA: Diagnosis not present

## 2021-03-28 DIAGNOSIS — M6281 Muscle weakness (generalized): Secondary | ICD-10-CM | POA: Diagnosis not present

## 2021-03-29 DIAGNOSIS — G8191 Hemiplegia, unspecified affecting right dominant side: Secondary | ICD-10-CM | POA: Diagnosis not present

## 2021-03-29 DIAGNOSIS — R2681 Unsteadiness on feet: Secondary | ICD-10-CM | POA: Diagnosis not present

## 2021-03-29 DIAGNOSIS — R2689 Other abnormalities of gait and mobility: Secondary | ICD-10-CM | POA: Diagnosis not present

## 2021-03-29 DIAGNOSIS — R296 Repeated falls: Secondary | ICD-10-CM | POA: Diagnosis not present

## 2021-03-29 NOTE — Telephone Encounter (Signed)
Called the patient, he stated that he would like to see [facility] instead of [facility].

## 2021-03-30 DIAGNOSIS — R279 Unspecified lack of coordination: Secondary | ICD-10-CM | POA: Diagnosis not present

## 2021-03-30 DIAGNOSIS — G8191 Hemiplegia, unspecified affecting right dominant side: Secondary | ICD-10-CM | POA: Diagnosis not present

## 2021-03-30 DIAGNOSIS — R296 Repeated falls: Secondary | ICD-10-CM | POA: Diagnosis not present

## 2021-03-30 DIAGNOSIS — M6281 Muscle weakness (generalized): Secondary | ICD-10-CM | POA: Diagnosis not present

## 2021-03-31 ENCOUNTER — Other Ambulatory Visit: Payer: Self-pay

## 2021-03-31 ENCOUNTER — Emergency Department: Payer: Medicare Other

## 2021-03-31 ENCOUNTER — Inpatient Hospital Stay
Admission: EM | Admit: 2021-03-31 | Discharge: 2021-04-06 | DRG: 690 | Disposition: A | Discharge status: Skilled Nursing Facility | Payer: Medicare Other | Attending: Student | Admitting: Student

## 2021-03-31 ENCOUNTER — Encounter: Payer: Self-pay | Admitting: Emergency Medicine

## 2021-03-31 ENCOUNTER — Emergency Department
Admission: EM | Admit: 2021-03-31 | Discharge: 2021-03-31 | Disposition: A | Discharge status: Home / Self Care | Payer: Medicare Other | Source: Home / Self Care | Attending: Emergency Medicine | Admitting: Emergency Medicine

## 2021-03-31 DIAGNOSIS — Z811 Family history of alcohol abuse and dependence: Secondary | ICD-10-CM

## 2021-03-31 DIAGNOSIS — Z79899 Other long term (current) drug therapy: Secondary | ICD-10-CM | POA: Insufficient documentation

## 2021-03-31 DIAGNOSIS — N401 Enlarged prostate with lower urinary tract symptoms: Secondary | ICD-10-CM | POA: Diagnosis present

## 2021-03-31 DIAGNOSIS — E860 Dehydration: Secondary | ICD-10-CM | POA: Diagnosis not present

## 2021-03-31 DIAGNOSIS — R531 Weakness: Secondary | ICD-10-CM | POA: Insufficient documentation

## 2021-03-31 DIAGNOSIS — H409 Unspecified glaucoma: Secondary | ICD-10-CM | POA: Diagnosis present

## 2021-03-31 DIAGNOSIS — M25511 Pain in right shoulder: Secondary | ICD-10-CM | POA: Insufficient documentation

## 2021-03-31 DIAGNOSIS — W19XXXA Unspecified fall, initial encounter: Secondary | ICD-10-CM

## 2021-03-31 DIAGNOSIS — E1136 Type 2 diabetes mellitus with diabetic cataract: Secondary | ICD-10-CM | POA: Insufficient documentation

## 2021-03-31 DIAGNOSIS — M6281 Muscle weakness (generalized): Secondary | ICD-10-CM | POA: Diagnosis not present

## 2021-03-31 DIAGNOSIS — M25551 Pain in right hip: Secondary | ICD-10-CM | POA: Insufficient documentation

## 2021-03-31 DIAGNOSIS — N138 Other obstructive and reflux uropathy: Secondary | ICD-10-CM | POA: Diagnosis present

## 2021-03-31 DIAGNOSIS — K219 Gastro-esophageal reflux disease without esophagitis: Secondary | ICD-10-CM | POA: Diagnosis not present

## 2021-03-31 DIAGNOSIS — I69351 Hemiplegia and hemiparesis following cerebral infarction affecting right dominant side: Secondary | ICD-10-CM

## 2021-03-31 DIAGNOSIS — E039 Hypothyroidism, unspecified: Secondary | ICD-10-CM | POA: Diagnosis not present

## 2021-03-31 DIAGNOSIS — I10 Essential (primary) hypertension: Secondary | ICD-10-CM | POA: Diagnosis not present

## 2021-03-31 DIAGNOSIS — Z20822 Contact with and (suspected) exposure to covid-19: Secondary | ICD-10-CM | POA: Insufficient documentation

## 2021-03-31 DIAGNOSIS — N179 Acute kidney failure, unspecified: Secondary | ICD-10-CM | POA: Diagnosis not present

## 2021-03-31 DIAGNOSIS — R739 Hyperglycemia, unspecified: Secondary | ICD-10-CM | POA: Diagnosis not present

## 2021-03-31 DIAGNOSIS — B9689 Other specified bacterial agents as the cause of diseases classified elsewhere: Secondary | ICD-10-CM | POA: Insufficient documentation

## 2021-03-31 DIAGNOSIS — R55 Syncope and collapse: Secondary | ICD-10-CM | POA: Diagnosis not present

## 2021-03-31 DIAGNOSIS — I129 Hypertensive chronic kidney disease with stage 1 through stage 4 chronic kidney disease, or unspecified chronic kidney disease: Secondary | ICD-10-CM | POA: Insufficient documentation

## 2021-03-31 DIAGNOSIS — Z85828 Personal history of other malignant neoplasm of skin: Secondary | ICD-10-CM

## 2021-03-31 DIAGNOSIS — G4733 Obstructive sleep apnea (adult) (pediatric): Secondary | ICD-10-CM

## 2021-03-31 DIAGNOSIS — M25561 Pain in right knee: Secondary | ICD-10-CM | POA: Insufficient documentation

## 2021-03-31 DIAGNOSIS — G2581 Restless legs syndrome: Secondary | ICD-10-CM | POA: Diagnosis not present

## 2021-03-31 DIAGNOSIS — G43909 Migraine, unspecified, not intractable, without status migrainosus: Secondary | ICD-10-CM | POA: Diagnosis present

## 2021-03-31 DIAGNOSIS — N390 Urinary tract infection, site not specified: Secondary | ICD-10-CM | POA: Diagnosis not present

## 2021-03-31 DIAGNOSIS — Z888 Allergy status to other drugs, medicaments and biological substances status: Secondary | ICD-10-CM

## 2021-03-31 DIAGNOSIS — Z7902 Long term (current) use of antithrombotics/antiplatelets: Secondary | ICD-10-CM

## 2021-03-31 DIAGNOSIS — Z833 Family history of diabetes mellitus: Secondary | ICD-10-CM

## 2021-03-31 DIAGNOSIS — R6889 Other general symptoms and signs: Secondary | ICD-10-CM | POA: Diagnosis not present

## 2021-03-31 DIAGNOSIS — R1084 Generalized abdominal pain: Secondary | ICD-10-CM | POA: Diagnosis not present

## 2021-03-31 DIAGNOSIS — E1122 Type 2 diabetes mellitus with diabetic chronic kidney disease: Secondary | ICD-10-CM | POA: Insufficient documentation

## 2021-03-31 DIAGNOSIS — Z9989 Dependence on other enabling machines and devices: Secondary | ICD-10-CM | POA: Diagnosis not present

## 2021-03-31 DIAGNOSIS — E785 Hyperlipidemia, unspecified: Secondary | ICD-10-CM | POA: Diagnosis present

## 2021-03-31 DIAGNOSIS — J929 Pleural plaque without asbestos: Secondary | ICD-10-CM | POA: Diagnosis not present

## 2021-03-31 DIAGNOSIS — M25521 Pain in right elbow: Secondary | ICD-10-CM | POA: Insufficient documentation

## 2021-03-31 DIAGNOSIS — M542 Cervicalgia: Secondary | ICD-10-CM | POA: Insufficient documentation

## 2021-03-31 DIAGNOSIS — E1169 Type 2 diabetes mellitus with other specified complication: Secondary | ICD-10-CM | POA: Insufficient documentation

## 2021-03-31 DIAGNOSIS — N183 Chronic kidney disease, stage 3 unspecified: Secondary | ICD-10-CM | POA: Insufficient documentation

## 2021-03-31 DIAGNOSIS — G8191 Hemiplegia, unspecified affecting right dominant side: Secondary | ICD-10-CM | POA: Diagnosis not present

## 2021-03-31 DIAGNOSIS — N139 Obstructive and reflux uropathy, unspecified: Secondary | ICD-10-CM | POA: Diagnosis not present

## 2021-03-31 DIAGNOSIS — E1140 Type 2 diabetes mellitus with diabetic neuropathy, unspecified: Secondary | ICD-10-CM | POA: Insufficient documentation

## 2021-03-31 DIAGNOSIS — R296 Repeated falls: Secondary | ICD-10-CM | POA: Diagnosis not present

## 2021-03-31 DIAGNOSIS — Z841 Family history of disorders of kidney and ureter: Secondary | ICD-10-CM

## 2021-03-31 DIAGNOSIS — N3000 Acute cystitis without hematuria: Secondary | ICD-10-CM | POA: Insufficient documentation

## 2021-03-31 DIAGNOSIS — Z794 Long term (current) use of insulin: Secondary | ICD-10-CM | POA: Insufficient documentation

## 2021-03-31 DIAGNOSIS — E1165 Type 2 diabetes mellitus with hyperglycemia: Secondary | ICD-10-CM | POA: Diagnosis present

## 2021-03-31 DIAGNOSIS — K5900 Constipation, unspecified: Secondary | ICD-10-CM | POA: Diagnosis present

## 2021-03-31 DIAGNOSIS — Z7189 Other specified counseling: Secondary | ICD-10-CM | POA: Diagnosis not present

## 2021-03-31 DIAGNOSIS — H26233 Glaucomatous flecks (subcapsular), bilateral: Secondary | ICD-10-CM | POA: Diagnosis not present

## 2021-03-31 DIAGNOSIS — R112 Nausea with vomiting, unspecified: Secondary | ICD-10-CM | POA: Diagnosis not present

## 2021-03-31 DIAGNOSIS — S0990XA Unspecified injury of head, initial encounter: Secondary | ICD-10-CM | POA: Insufficient documentation

## 2021-03-31 DIAGNOSIS — Z8673 Personal history of transient ischemic attack (TIA), and cerebral infarction without residual deficits: Secondary | ICD-10-CM

## 2021-03-31 DIAGNOSIS — Z6372 Alcoholism and drug addiction in family: Secondary | ICD-10-CM

## 2021-03-31 DIAGNOSIS — R279 Unspecified lack of coordination: Secondary | ICD-10-CM | POA: Diagnosis not present

## 2021-03-31 DIAGNOSIS — R5381 Other malaise: Secondary | ICD-10-CM | POA: Diagnosis not present

## 2021-03-31 DIAGNOSIS — Z736 Limitation of activities due to disability: Secondary | ICD-10-CM | POA: Diagnosis not present

## 2021-03-31 DIAGNOSIS — N1832 Chronic kidney disease, stage 3b: Secondary | ICD-10-CM | POA: Diagnosis not present

## 2021-03-31 DIAGNOSIS — Z823 Family history of stroke: Secondary | ICD-10-CM

## 2021-03-31 DIAGNOSIS — Z79890 Hormone replacement therapy (postmenopausal): Secondary | ICD-10-CM

## 2021-03-31 DIAGNOSIS — R140 Abdominal distension (gaseous): Secondary | ICD-10-CM

## 2021-03-31 DIAGNOSIS — R1312 Dysphagia, oropharyngeal phase: Secondary | ICD-10-CM | POA: Diagnosis not present

## 2021-03-31 DIAGNOSIS — D696 Thrombocytopenia, unspecified: Secondary | ICD-10-CM | POA: Diagnosis present

## 2021-03-31 DIAGNOSIS — N281 Cyst of kidney, acquired: Secondary | ICD-10-CM | POA: Diagnosis not present

## 2021-03-31 DIAGNOSIS — Z7984 Long term (current) use of oral hypoglycemic drugs: Secondary | ICD-10-CM | POA: Insufficient documentation

## 2021-03-31 DIAGNOSIS — M2578 Osteophyte, vertebrae: Secondary | ICD-10-CM | POA: Diagnosis not present

## 2021-03-31 DIAGNOSIS — M5030 Other cervical disc degeneration, unspecified cervical region: Secondary | ICD-10-CM | POA: Diagnosis not present

## 2021-03-31 DIAGNOSIS — I739 Peripheral vascular disease, unspecified: Secondary | ICD-10-CM | POA: Diagnosis not present

## 2021-03-31 DIAGNOSIS — Z743 Need for continuous supervision: Secondary | ICD-10-CM | POA: Diagnosis not present

## 2021-03-31 DIAGNOSIS — M79603 Pain in arm, unspecified: Secondary | ICD-10-CM | POA: Diagnosis not present

## 2021-03-31 DIAGNOSIS — M47814 Spondylosis without myelopathy or radiculopathy, thoracic region: Secondary | ICD-10-CM | POA: Diagnosis not present

## 2021-03-31 DIAGNOSIS — N1831 Chronic kidney disease, stage 3a: Secondary | ICD-10-CM | POA: Diagnosis present

## 2021-03-31 DIAGNOSIS — Z885 Allergy status to narcotic agent status: Secondary | ICD-10-CM

## 2021-03-31 DIAGNOSIS — I728 Aneurysm of other specified arteries: Secondary | ICD-10-CM | POA: Diagnosis not present

## 2021-03-31 DIAGNOSIS — R918 Other nonspecific abnormal finding of lung field: Secondary | ICD-10-CM | POA: Diagnosis not present

## 2021-03-31 DIAGNOSIS — Z801 Family history of malignant neoplasm of trachea, bronchus and lung: Secondary | ICD-10-CM

## 2021-03-31 DIAGNOSIS — M19011 Primary osteoarthritis, right shoulder: Secondary | ICD-10-CM | POA: Diagnosis not present

## 2021-03-31 LAB — URINALYSIS, COMPLETE (UACMP) WITH MICROSCOPIC
Bilirubin Urine: NEGATIVE
Glucose, UA: >=500 mg/dL — AB
Hgb urine dipstick: NEGATIVE
Ketones, ur: NEGATIVE mg/dL
Nitrite: POSITIVE — AB
Protein, ur: 30 mg/dL — AB
Specific Gravity, Urine: 1.016 (ref 1.005–1.030)
Squamous Epithelial / HPF: NONE SEEN (ref 0–5)
pH: 5 (ref 5.0–8.0)

## 2021-03-31 LAB — CBC WITH DIFFERENTIAL/PLATELET
Abs Immature Granulocytes: 0.09 10*3/uL — ABNORMAL HIGH (ref 0.00–0.07)
Abs Immature Granulocytes: 0.05 10*3/uL (ref 0.00–0.07)
Basophils Absolute: 0 10*3/uL (ref 0.0–0.1)
Basophils Absolute: 0 10*3/uL (ref 0.0–0.1)
Basophils Relative: 0 %
Basophils Relative: 0 %
Eosinophils Absolute: 0.1 10*3/uL (ref 0.0–0.5)
Eosinophils Absolute: 0.1 10*3/uL (ref 0.0–0.5)
Eosinophils Relative: 1 %
Eosinophils Relative: 1 %
HCT: 43.6 % (ref 39.0–52.0)
HCT: 44.1 % (ref 39.0–52.0)
Hemoglobin: 14.4 g/dL (ref 13.0–17.0)
Hemoglobin: 14.4 g/dL (ref 13.0–17.0)
Immature Granulocytes: 1 %
Immature Granulocytes: 0 %
Lymphocytes Relative: 9 %
Lymphocytes Relative: 12 %
Lymphs Abs: 1.4 10*3/uL (ref 0.7–4.0)
Lymphs Abs: 1.8 10*3/uL (ref 0.7–4.0)
MCH: 30 pg (ref 26.0–34.0)
MCH: 29.8 pg (ref 26.0–34.0)
MCHC: 33 g/dL (ref 30.0–36.0)
MCHC: 32.7 g/dL (ref 30.0–36.0)
MCV: 90.8 fL (ref 80.0–100.0)
MCV: 91.1 fL (ref 80.0–100.0)
Monocytes Absolute: 0.9 10*3/uL (ref 0.1–1.0)
Monocytes Absolute: 1.1 10*3/uL — ABNORMAL HIGH (ref 0.1–1.0)
Monocytes Relative: 6 %
Monocytes Relative: 8 %
Neutro Abs: 12.9 10*3/uL — ABNORMAL HIGH (ref 1.7–7.7)
Neutro Abs: 11.6 10*3/uL — ABNORMAL HIGH (ref 1.7–7.7)
Neutrophils Relative %: 83 %
Neutrophils Relative %: 79 %
Platelets: 162 10*3/uL (ref 150–400)
Platelets: 148 10*3/uL — ABNORMAL LOW (ref 150–400)
RBC: 4.8 MIL/uL (ref 4.22–5.81)
RBC: 4.84 MIL/uL (ref 4.22–5.81)
RDW: 13.2 % (ref 11.5–15.5)
RDW: 13.3 % (ref 11.5–15.5)
WBC: 15.4 10*3/uL — ABNORMAL HIGH (ref 4.0–10.5)
WBC: 14.6 10*3/uL — ABNORMAL HIGH (ref 4.0–10.5)
nRBC: 0 % (ref 0.0–0.2)
nRBC: 0 % (ref 0.0–0.2)

## 2021-03-31 LAB — BASIC METABOLIC PANEL
Anion gap: 7 (ref 5–15)
Anion gap: 9 (ref 5–15)
BUN: 28 mg/dL — ABNORMAL HIGH (ref 8–23)
BUN: 25 mg/dL — ABNORMAL HIGH (ref 8–23)
CO2: 25 mmol/L (ref 22–32)
CO2: 27 mmol/L (ref 22–32)
Calcium: 8.8 mg/dL — ABNORMAL LOW (ref 8.9–10.3)
Calcium: 8.7 mg/dL — ABNORMAL LOW (ref 8.9–10.3)
Chloride: 103 mmol/L (ref 98–111)
Chloride: 101 mmol/L (ref 98–111)
Creatinine, Ser: 1.52 mg/dL — ABNORMAL HIGH (ref 0.61–1.24)
Creatinine, Ser: 1.53 mg/dL — ABNORMAL HIGH (ref 0.61–1.24)
GFR, Estimated: 44 mL/min — ABNORMAL LOW (ref >60)
GFR, Estimated: 44 mL/min — ABNORMAL LOW (ref >60)
Glucose, Bld: 220 mg/dL — ABNORMAL HIGH (ref 70–99)
Glucose, Bld: 161 mg/dL — ABNORMAL HIGH (ref 70–99)
Potassium: 4.1 mmol/L (ref 3.5–5.1)
Potassium: 3.7 mmol/L (ref 3.5–5.1)
Sodium: 135 mmol/L (ref 135–145)
Sodium: 137 mmol/L (ref 135–145)

## 2021-03-31 LAB — RESP PANEL BY RT-PCR (FLU A&B, COVID) ARPGX2
Influenza A by PCR: NEGATIVE
Influenza B by PCR: NEGATIVE
SARS Coronavirus 2 by RT PCR: NEGATIVE

## 2021-03-31 MED ORDER — SODIUM CHLORIDE 0.9 % IV SOLN
1.0000 g | Freq: Once | INTRAVENOUS | Status: AC
  Administered 2021-03-31: 1 g via INTRAVENOUS
  Filled 2021-03-31: qty 10

## 2021-03-31 MED ORDER — LACTATED RINGERS IV BOLUS
1000.0000 mL | Freq: Once | INTRAVENOUS | Status: AC
  Administered 2021-03-31: 1000 mL via INTRAVENOUS

## 2021-03-31 MED ORDER — AMOXICILLIN-POT CLAVULANATE 875-125 MG PO TABS: 1.0000 | ORAL_TABLET | Freq: Two times a day (BID) | ORAL | 0 refills | Status: DC

## 2021-03-31 NOTE — ED Notes (Signed)
Called [facility] for transport to [place]

## 2021-03-31 NOTE — ED Notes (Signed)
Pt resting with eyes closed. VSS. Will continue to monitor for changes.

## 2021-03-31 NOTE — H&P (Addendum)
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  He was seen last night in the ER after having a fall and trauma work-up came back negative.  He stated that his legs are not holding him and that he fell 3 times over the last week.  He admitted to urinary frequency and urgency without dysuria or hematuria or flank pain.  He has been having mild abdominal distention and tenderness with associated constipation.  No melena or bright red bleeding per rectum.  ED Course: Upon presenting to the emergency room, blood pressure was 145/60 with otherwise normal vital signs.  Labs revealed.  25 and creatinine 1.53 comparable to previous levels with stage IIIb CKD, and CBC showed leukocytosis 14.6.  UA was positive for UTI.  COVID-19 PCR is currently pending.  Urine culture was sent. EKG as reviewed by me : Showed normal sinus rhythm with a rate of 92 with left intrafascicular block and poor R wave progression Imaging: Chest x-ray yesterday morning showed lower lung volumes with no acute cardiopulmonary disease or traumatic injury.  Right knee, shoulder and elbow x-ray revealed no acute abnormalities.  The patient was given 1 g of IV Rocephin and 1 L bolus  of IV lactated Ringer.  He will be admitted to a medical bed for further evaluation and management. PAST MEDICAL HISTORY:   Past Medical History:  Diagnosis Date   Anemia    Basal cell carcinoma 2018   R temple    Basal cell carcinoma of nose 2002   bridge of nose   BPH with obstruction/lower urinary tract symptoms    failed flomax, uroxatral, myrbetriq - sees urology [name]) pending videourodynamics   CKD (chronic kidney disease) stage 3, GFR 30-59 ml/min ([place]) 03/11/2012   Renal US 02/2014 - multiple kidney cysts    CVA (cerebral infarction) 03/07/12   slurred speech; right hemplegia, left handed - completed PT 07/2014 (17 sessions)   Depression    mild, on cymbalta per prior PCP   Erectile dysfunction    per urology (prostaglandin injection)   GERD (gastroesophageal reflux disease)    with sliding HH   H/O hiatal hernia    History of kidney problems 12/2010   lacerated left kidney after fall   HLD (hyperlipidemia)    Hx of basal cell carcinoma 07/10/2017   R. lat. forehead near hair line   Kidney cysts 02/2014   bilateral (renal US)   Migraines 03/11/12   now rare   OSA on CPAP    sleep study 01/2014 - severe, CPAP at 10, previously on nuvigil (Dr. [name])   Palpitations 01/2012   holter WNL, rare PAC/PVCs   Presbycusis of both ears 2016   rec amplification [facility])  Restless leg syndrome    well controlled on mirapex   Rosacea    [name]   Stroke [facility])    TIA (transient ischemic attack)    Type 2 diabetes, controlled, with neuropathy ([place]) 2011   [name] in [place]:   Past Surgical History:  Procedure Laterality Date   ABI  2007   WNL   CARDIAC CATHETERIZATION  2004   normal; [place]   CARDIOVASCULAR STRESS TEST  02/2014   WNL, low risk scan, EF 68%   CAROTID ENDARTERECTOMY     CATARACT EXTRACTION W/ INTRAOCULAR LENS IMPLANT  11//2012 - 08/2011   COLONOSCOPY  03/2011   poor prep, unable to biopsy polyp in tic fold, rpt 3 mo  [name])   COLONOSCOPY  06/2011   mult polyps adenomatous, severe diverticulosis, rpt 3 yrs [name])   CYSTOSCOPY WITH URETHRAL DILATATION N/A 07/24/2013   Procedure: CYSTOSCOPY WITH URETHRAL DILATATION;  Surgeon: [name], MD;  Location: [facility];  Service: Urology;  Laterality: N/A;   ESOPHAGOGASTRODUODENOSCOPY  2006   duodenitis, medual HH, Grade 2 reflux, mild gastritis   ESOPHAGOGASTRODUODENOSCOPY  07/2011   mild chronic gastritis   HEMORRHOID SURGERY  1991   INGUINAL HERNIA REPAIR  1981; 2002   left, then repaired   MRI brain  04/2010   WNL   PROSTATE SURGERY  02/2009   PVP (laser)   SKIN CANCER EXCISION  2002   bridge of nose, [facility]   TONSILLECTOMY AND ADENOIDECTOMY  ~ [place] N/A 07/24/2013   Procedure: TRANSURETHRAL INCISION OF THE PROSTATE (TUIP);  Surgeon: [name], MD;  Location: [facility];  Service: Urology;  Laterality: N/A;   URETHRAL DILATION  2014   [place]    SOCIAL HISTORY:   Social History   Tobacco Use   Smoking status: Never   Smokeless tobacco: Never   Tobacco comments:    "used to smoke pipe when I was younger"  Substance Use Topics   Alcohol use: Yes    Alcohol/week: 4.0 standard drinks    Types: 2 Glasses of wine, 2 Shots of liquor per week    Comment: occasional    FAMILY HISTORY:   Family History  Problem Relation Age of Onset   Stroke Father    Lung cancer Father 52        smoker   Stroke Brother    Diabetes Brother 31   Lung cancer Brother    Alcoholism Sister    Kidney disease Sister    Coronary artery disease Neg Hx     DRUG ALLERGIES:   Allergies  Allergen Reactions   Crestor [Rosuvastatin]     Other reaction(s): muscle pain 2.16.2022 Pt is current on this medication without any issues.   Dulaglutide     Other reaction(s): upset stomach   Empagliflozin     Other reaction(s): yeast infection   Gabapentin Other (See Comments)    At high doses - BLURRED VISION, BALLANCE,  CONCENTRATION PROBLEMS, RESTLESSNESS, NOT SLEEPING, ? LOST OF STRENGTH, UNSTEADINESS   Lisinopril Cough    tachycardia   Nexium [Esomeprazole] Other (See Comments)    Headache   Niacin And Related Other (See Comments)    flushing   Oxycodone Other (See Comments)    Even 1/2 tablet of 5mg  caused increased confusion - contributory to hospitalization 10/2020 with AMS   Pregabalin     Other reaction(s): MALAISE, SLIGHT NAUSEA, SLIGHT HEADACHE  REVIEW OF SYSTEMS:   ROS As per history of present illness. All pertinent systems were reviewed above. Constitutional, HEENT, cardiovascular, respiratory, GI, GU, musculoskeletal, neuro, psychiatric, endocrine, integumentary and hematologic systems were reviewed and are otherwise negative/unremarkable except for positive findings mentioned above in the HPI.   MEDICATIONS AT HOME:   Prior to Admission medications   Medication Sig Start Date End Date Taking? Authorizing Provider  Alpha-Lipoic Acid 600 MG CAPS Take 1 capsule (600 mg total) by mouth daily. Patient taking differently: Take 1 capsule by mouth in the morning and at bedtime. 12/07/19   [name], MD  amoxicillin-clavulanate (AUGMENTIN) 875-125 MG tablet Take 1 tablet by mouth 2 (two) times daily for 10 days. 03/31/21 04/10/21  Rudene Re, MD  BD PEN NEEDLE NANO 2ND GEN 32G X 4 MM MISC USE TO INJECT MEDICATION DAILY 01/12/21   [name], MD  brimonidine [facility]) 0.2 % ophthalmic solution  03/25/21   [provider]  celecoxib (CELEBREX) 200 MG capsule Take 1 capsule (200 mg total) by mouth daily. Patient taking differently: Take 200 mg by mouth daily as needed for mild pain. 06/16/20   Copland, Frederico Hamman, MD  cephALEXin (KEFLEX) 500 MG capsule  03/25/21   [provider]  clopidogrel (PLAVIX) 75 MG tablet TAKE 1 TABLET DAILY 01/11/21   [name], MD  Coenzyme Q10 (COQ10) 150 MG CAPS Take 1 capsule by mouth daily.    [provider]  COMBIGAN  0.2-0.5 % ophthalmic solution  03/25/21   [provider]  diclofenac Sodium (VOLTAREN) 1 % GEL Apply 2 g topically 3 (three) times daily as needed. Patient taking differently: Apply 2 g topically 3 (three) times daily as needed (pain). 06/04/20   [name], MD  dorzolamide-timolol (COSOPT) 22.3-6.8 MG/ML ophthalmic solution  12/17/20   [provider]  DULoxetine (CYMBALTA) 60 MG capsule TAKE 1 CAPSULE DAILY 08/03/20   [name], MD  famotidine (PEPCID) 20 MG tablet Take 1 tablet (20 mg total) by mouth at bedtime. Patient taking differently: Take 20 mg by mouth daily. PRN 07/16/19   [name], MD  Fluocinolone Acetonide 0.01 % OIL  03/25/21   [provider]  gabapentin (NEURONTIN) 300 MG capsule Take 600 mg by mouth 3 (three) times daily. 600 mg BID is his minimum dose without having breakthrough symptoms. He is aware 900 mg/day is recommended per kidney function. 11/15/20   [name], MD  insulin degludec (TRESIBA FLEXTOUCH) 200 UNIT/ML FlexTouch Pen Inject 90 Units into the skin at bedtime. As needed 08/03/20   [name], MD  insulin lispro (HUMALOG) 100 UNIT/ML KwikPen Inject 20 Units into the skin daily at 12 noon. With largest meal (lunch) as well as occasionally with dinner; Sliding Scale prior to meals if needed 03/08/21   [name], MD  ipratropium (ATROVENT) 0.03 % nasal spray  03/25/21   [provider]  JARDIANCE 10 MG TABS tablet  03/25/21   [provider]  l-methylfolate-B6-B12 (METANX) 3-35-2 MG TABS tablet Take 1 tablet by mouth daily. 07/07/20   Swayze, Ava, DO  levothyroxine (SYNTHROID) 50 MCG tablet Take 1 tablet (50 mcg total) by mouth daily before breakfast. Per endo [name] 12/07/19   [name], MD  LORazepam (ATIVAN) 0.5 MG tablet Take 1 tablet (0.5 mg total) by mouth at bedtime as needed for sleep. 11/30/20   [name], MD  LUMIGAN 0.01 % SOLN Place 1 drop into the right eye at  bedtime. 06/01/20   [name],  MD  melatonin 5 MG TABS Take 5 mg by mouth at bedtime. 05/24/18   [provider]  metoprolol tartrate (LOPRESSOR) 25 MG tablet TAKE ONE-HALF (1/2) TABLET (12.5 MG) AT BEDTIME WITH MORNING DOSE IF NEEDED Patient taking differently: Take 25 mg by mouth at bedtime. PRN palpitations 04/13/20   [name], MD  nitroGLYCERIN (NITROSTAT) 0.4 MG SL tablet DISSOLVE 1 TABLET UNDER THE TONGUE EVERY 5 MINUTES AS NEEDED FOR CHEST PAIN Patient taking differently: Place 0.4 mg under the tongue every 5 (five) minutes as needed for chest pain. 07/28/16   [name], MD  ofloxacin (FLOXIN) 0.3 % OTIC solution  03/25/21   [provider]  Omega-3 Fatty Acids (FISH OIL) 1000 MG CAPS Take 1 capsule (1,000 mg total) by mouth in the morning and at bedtime. 06/01/20   [name], MD  ondansetron (ZOFRAN) 4 MG tablet Take 1 tablet (4 mg total) by mouth every 8 (eight) hours as needed for nausea or vomiting. 11/10/20   [name], MD  pramipexole (MIRAPEX) 1 MG tablet Take 1 tablet (1 mg total) by mouth See admin instructions. At 4pm 12/30/20   [name], MD  RABEprazole (ACIPHEX) 20 MG tablet TAKE 1 TABLET DAILY 08/03/20   [name], MD  rosuvastatin (CRESTOR) 40 MG tablet Take 1 tablet (40 mg total) by mouth daily. 04/22/20   [name], MD  Semaglutide, 1 MG/DOSE, (OZEMPIC, 1 MG/DOSE,) 2 MG/1.5ML SOPN Inject 1 mg into the skin every Saturday. 06/08/20   [provider]  senna-docusate (SENOKOT-S) 8.6-50 MG tablet Take 1 tablet by mouth at bedtime. 11/05/20   Florencia Reasons, MD  sulfamethoxazole-trimethoprim (BACTRIM DS) 800-160 MG tablet  03/25/21   [provider]  tamsulosin (FLOMAX) 0.4 MG CAPS capsule Take 1 capsule (0.4 mg total) by mouth daily after breakfast. 12/30/20   [name], MD  valsartan (DIOVAN) 40 MG tablet Take 0.5 tablets (20 mg total) by mouth daily. 12/23/20   [name], MD  vitamin C  (ASCORBIC ACID) 500 MG tablet Take 1,000 mg by mouth daily as needed.    [provider]      VITAL SIGNS:  Blood pressure (!) 132/55, pulse 90, temperature 98.7 F (37.1 C), resp. rate 18, height 5\' 8"  (1.727 m), weight 83.9 kg, SpO2 99 %.  PHYSICAL EXAMINATION:  Physical Exam  GENERAL:  87 y.o.-year-old Caucasian male patient lying in the bed with no acute distress.  EYES: Pupils equal, round, reactive to light and accommodation. No scleral icterus. Extraocular muscles intact.  HEENT: Head atraumatic, normocephalic. Oropharynx and nasopharynx clear.  NECK:  Supple, no jugular venous distention. No thyroid enlargement, no tenderness.  LUNGS: Normal breath sounds bilaterally, no wheezing, rales,rhonchi or crepitation. No use of accessory muscles of respiration.  CARDIOVASCULAR: Regular rate and rhythm, S1, S2 normal. No murmurs, rubs, or gallops.  ABDOMEN: Soft, mildly distended with generalized tenderness without rebound tenderness guarding or rigidity.. Bowel sounds present. No organomegaly or mass.  EXTREMITIES: No pedal edema, cyanosis, or clubbing.  NEUROLOGIC: Cranial nerves II through XII are intact. Muscle strength 5/5 in all extremities. Sensation intact. Gait not checked.  PSYCHIATRIC: The patient is alert and oriented x 3.  Normal affect and good eye contact. SKIN: No obvious rash, lesion, or ulcer.   LABORATORY PANEL:   CBC Recent Labs  Lab 03/31/21 2238  WBC 14.6*  HGB 14.4  HCT 44.1  PLT 148*   ------------------------------------------------------------------------------------------------------------------  Chemistries  Recent Labs  Lab 03/31/21 2238  NA 137  K 3.7  CL 101  CO2 27  GLUCOSE 161*  BUN 25*  CREATININE 1.53*  CALCIUM 8.7*   ------------------------------------------------------------------------------------------------------------------  Cardiac Enzymes No results for input(s): TROPONINI in the last 168  hours. ------------------------------------------------------------------------------------------------------------------  RADIOLOGY:  DG Chest 1 View  Result Date: 03/31/2021 CLINICAL DATA:  87 year old male status post falls. Pain. EXAM: CHEST  1 VIEW COMPARISON:  Portable chest 11/02/2020 and earlier. FINDINGS: Supine AP views at 0426 hours. Lower lung volumes. Mediastinal contours are stable and within normal limits. Visualized tracheal air column is within normal limits. Allowing for portable technique the lungs are clear. Mild apical pleural thickening is stable. No pneumothorax or pleural effusion evident on these supine views. Stable visualized osseous structures. IMPRESSION: Lower lung volumes. No acute cardiopulmonary abnormality or acute traumatic injury identified. Electronically Signed   By: Genevie Ann M.D.   On: 03/31/2021 05:30   DG Shoulder Right  Result Date: 03/31/2021 CLINICAL DATA:  87 year old male status post falls. Pain. EXAM: RIGHT SHOULDER - 2+ VIEW COMPARISON:  Chest radiographs 03/18/2015. FINDINGS: Bone mineralization is within normal limits for age. There is no evidence of fracture or dislocation. Normal glenohumeral alignment. Proximal right humerus appears intact. Mild for age degenerative spurring at the right [facility] joint. Visible right ribs appear intact. IMPRESSION: No acute fracture or dislocation identified about the right shoulder. Electronically Signed   By: Genevie Ann M.D.   On: 03/31/2021 05:29   DG Elbow Complete Right  Result Date: 03/31/2021 CLINICAL DATA:  87 year old male status post falls. Pain. EXAM: RIGHT ELBOW - COMPLETE 3+ VIEW COMPARISON:  None. FINDINGS: Lateral view is slightly oblique. Bone mineralization is within normal limits for age. There is no evidence of fracture, dislocation, or joint effusion. No significant degenerative changes. No discrete soft tissue injury. IMPRESSION: No acute fracture or dislocation identified about the right elbow.  Electronically Signed   By: Genevie Ann M.D.   On: 03/31/2021 05:27   CT Head Wo Contrast  Result Date: 03/31/2021 CLINICAL DATA:  87 year old male status post falls, collapse. EXAM: CT HEAD WITHOUT CONTRAST TECHNIQUE: Contiguous axial images were obtained from the base of the skull through the vertex without intravenous contrast. COMPARISON:  Brain MRI 11/02/2020.  Head CT 03/22/2021. FINDINGS: Brain: Advanced chronic small vessel disease in the left corona radiata and lentiform appears stable. Stable gray-white matter differentiation elsewhere. No midline shift, ventriculomegaly, mass effect, evidence of mass lesion, intracranial hemorrhage or evidence of cortically based acute infarction. Vascular: Calcified atherosclerosis at the skull base. No suspicious intracranial vascular hyperdensity. Skull: Stable, intact. Small imbedded metallic foreign body at the vertex is stable (series 2, image 75). Sinuses/Orbits: Visualized paranasal sinuses and mastoids are stable and well aerated. Other: No orbit or scalp soft tissue injury identified. IMPRESSION: 1. No acute intracranial abnormality or acute traumatic injury identified. 2. Chronic small vessel disease in the left corona radiata and lentiform. Electronically Signed   By: Genevie Ann M.D.   On: 03/31/2021 04:56   CT Cervical Spine Wo Contrast  Result Date: 03/31/2021 CLINICAL DATA:  87 year old male status post falls, collapse. EXAM: CT CERVICAL SPINE WITHOUT CONTRAST TECHNIQUE: Multidetector CT imaging of the cervical spine was performed without intravenous contrast. Multiplanar CT image reconstructions were also generated. COMPARISON:  Cervical spine CT 07/01/2020. Head CT today reported separately. FINDINGS: Alignment: Stable, mildly exaggerated cervical lordosis. Cervicothoracic junction alignment is within normal limits. Posterior element alignment is stable. Skull base and vertebrae: Visualized skull base is intact. No atlanto-occipital dissociation. Stable  bone mineralization. C1 and C2 appear stable and intact. No acute osseous abnormality identified. Chronic interbody ankylosis C6 through T1 from flowing anterior endplate osteophytes. Soft tissues and spinal canal: No prevertebral fluid or swelling. No visible canal hematoma. Negative noncontrast neck soft tissues. Disc levels: Stable cervical spine degeneration. Fairly capacious cervical spinal canal. Upper chest: Stable visible upper chest. IMPRESSION: 1. No acute traumatic injury identified in the cervical spine. 2. Chronic cervical spine degeneration. Electronically Signed   By: Genevie Ann M.D.   On: 03/31/2021 04:59   DG Knee Complete 4 Views Right  Result Date: 03/31/2021 CLINICAL DATA:  87 year old male status post falls. Pain. EXAM: RIGHT KNEE - COMPLETE 4+ VIEW COMPARISON:  None. FINDINGS: Bone mineralization is within normal limits for age. No evidence of fracture, dislocation, or joint effusion. No significant degenerative changes. No discrete soft tissue injury. IMPRESSION: No acute fracture or dislocation identified about the right knee. Electronically Signed   By: Genevie Ann M.D.   On: 03/31/2021 05:32   DG Hip Unilat W or Wo Pelvis 2-3 Views Right  Result Date: 03/31/2021 CLINICAL DATA:  87 year old male status post falls.  Pain. EXAM: DG HIP (WITH OR WITHOUT PELVIS) 2-3V RIGHT COMPARISON:  CT Abdomen and Pelvis 07/01/2020. FINDINGS: Bone mineralization is within normal limits for age. There is no evidence of hip fracture or dislocation. Pelvis appears intact. Grossly intact proximal left femur. Incidental scrotal surgical clips. IMPRESSION: No acute fracture or dislocation identified about the right hip or pelvis. Electronically Signed   By: Genevie Ann M.D.   On: 03/31/2021 05:26      IMPRESSION AND PLAN:  Active Problems:   UTI (urinary tract infection)  1.  Generalized weakness and deconditioning partly secondary to UTI.  The patient had subsequent recurrent falls.  He has been  constipated. - The patient be admitted to a medical monitored bed. - We will continue antibiotic therapy with IV Rocephin for his UTI and follow urine culture. - Physical therapy consult to be obtained to assess his gait given recurrent falls. -She will be hydrated with IV normal saline. - We will manage his constipation.  2.  Type 2 diabetes mellitus. - The patient will be placed on supplement coverage with NovoLog and continue his basal coverage. - We will continue his Jardiance.  3.  Hypothyroidism. - We will continue Synthroid.  4.  BPH. - We will continue Flomax.  5.  Dyslipidemia. - We will continue statin therapy.  6.  GERD. - We will continue PPI therapy.  7.  History of involuntary movements/tremors. - We will continue with Mirapex.  8.  Glaucoma. - We will continue his ophthalmic gtt.  DVT prophylaxis: Lovenox.  Code Status: full code.  Family Communication:  The plan of care was discussed in details with the patient (and family). I answered all questions. The patient agreed to proceed with the above mentioned plan. Further management will depend upon hospital course. Disposition Plan: Back to previous home environment Consults called: none.  All the records are reviewed and case discussed with ED provider.  Status is: Inpatient  Remains inpatient appropriate because:Ongoing diagnostic testing needed not appropriate for outpatient work up, Unsafe d/c plan, IV treatments appropriate due to intensity of illness or inability to take PO, and Inpatient level of care appropriate due to severity of illness  Dispo: The patient is from: Home              Anticipated d/c is to: Home  Patient currently is not medically stable to d/c.   Difficult to place patient No   TOTAL TIME TAKING CARE OF THIS PATIENT: 55 minutes.    Christel Mormon M.D on 03/31/2021 at 11:42 PM  [facility]   From 7 PM-7 AM, contact night-coverage [email or web address]  CC: Primary  care physician; [name], MD

## 2021-03-31 NOTE — ED Notes (Signed)
Report to Kelley, RN  

## 2021-03-31 NOTE — ED Triage Notes (Signed)
Pt presents to the ER via EMS from [facility] s/p a fall x2. Pt reports he just collapsed. He denies sx prior to fall. Per EMS, pt on Plavix. Pt denies LOC. Pt notes right arm and leg pain.

## 2021-03-31 NOTE — ED Notes (Signed)
ED Provider at bedside. 

## 2021-03-31 NOTE — ED Notes (Signed)
Patient transported to CT 

## 2021-03-31 NOTE — ED Provider Notes (Signed)
[facility] Emergency Department Provider Note  ____________________________________________  Time seen: Approximately 4:30 AM  I have reviewed the triage vital signs and the nursing notes.   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  Patient reports that over the last several days he has felt very weak.  Had 2 falls today because his legs just gave out.  He denies feeling dizzy or passing out.  He did hit his head on the floor.  He is complaining of right-sided shoulder pain, elbow pain, hip pain, neck pain, and knee pain.  She denies chest pain or shortness of breath, abdominal pain, cough, fever, body aches, dysuria or hematuria, vomiting or diarrhea.   Past Medical History:  Diagnosis Date   Anemia    Basal cell carcinoma 2018   R temple    Basal cell carcinoma of nose 2002   bridge of nose   BPH with obstruction/lower urinary tract symptoms    failed flomax, uroxatral, myrbetriq - sees urology [name]) pending videourodynamics   CKD (chronic kidney disease) stage 3, GFR 30-59 ml/min ([place]) 03/11/2012   Renal US 02/2014 - multiple kidney cysts    CVA (cerebral infarction) 03/07/12   slurred speech; right hemplegia, left handed - completed PT 07/2014 (17 sessions)   Depression    mild, on cymbalta per prior PCP   Erectile dysfunction    per urology (prostaglandin injection)   GERD (gastroesophageal reflux disease)    with sliding HH   H/O hiatal hernia    History of kidney problems 12/2010   lacerated left kidney after fall   HLD (hyperlipidemia)    Hx of basal cell carcinoma 07/10/2017   R. lat. forehead near hair line   Kidney cysts 02/2014   bilateral (renal US)   Migraines 03/11/12   now rare   OSA on CPAP    sleep study 01/2014 - severe, CPAP at 10, previously  on nuvigil (Dr. [name])   Palpitations 01/2012   holter WNL, rare PAC/PVCs   Presbycusis of both ears 2016   rec amplification [facility])   Restless leg syndrome    well controlled on mirapex   Rosacea    [name]   Stroke [facility])    TIA (transient ischemic attack)    Type 2 diabetes, controlled, with neuropathy ([place]) 2011   [name] in [facility]    Patient Active Problem List   Diagnosis Date Noted   Polypharmacy 03/10/2021   Acute encephalopathy 11/02/2020   Dysuria 10/18/2020   Healthcare maintenance 06/01/2020   Involuntary movements 04/25/2020   Post-nasal drainage 07/16/2019   Recurrent productive cough 05/31/2019   Hx of basal cell carcinoma 07/10/2017   Rosacea    Gait disturbance, post-stroke 10/05/2015   Presbycusis of both ears    Advanced care planning/counseling discussion 06/23/2015   Impaired mobility 04/23/2015   Hearing loss of right ear due to cerumen impaction 03/23/2015   HTN (hypertension) 44/11/4740   Diastolic dysfunction 59/56/3875   Spastic hemiparesis affecting dominant side ([place]) 07/07/2014   Chest pressure 02/03/2014   Medicare annual wellness visit, subsequent 11/06/2013   Erectile dysfunction due to arterial insufficiency 02/05/2013   MDD (major depressive disorder), recurrent episode, moderate (HCC)    Palpitations 06/26/2012   Hyperlipidemia associated with type 2 diabetes mellitus ([place])    Restless  leg syndrome    GERD (gastroesophageal reflux disease)    BPH with obstruction/lower urinary tract symptoms    History of CVA (cerebrovascular accident) 03/11/2012   Hemiparesis affecting right side as late effect of stroke ([place]) 03/11/2012   Type 2 diabetes mellitus, uncontrolled, with neuropathy ([place]) 03/11/2012   OSA on CPAP 03/11/2012   CKD stage 3 secondary to diabetes ([place]) 03/11/2012   Migraines 03/11/2012   Thrombocytopenia ([place]) 2011   Cause of injury, fall 2011    Past Surgical History:  Procedure Laterality Date   ABI  2007   WNL    CARDIAC CATHETERIZATION  2004   normal; [place]   CARDIOVASCULAR STRESS TEST  02/2014   WNL, low risk scan, EF 68%   CAROTID ENDARTERECTOMY     CATARACT EXTRACTION W/ INTRAOCULAR LENS IMPLANT  11//2012 - 08/2011   COLONOSCOPY  03/2011   poor prep, unable to biopsy polyp in tic fold, rpt 3 mo [name])   COLONOSCOPY  06/2011   mult polyps adenomatous, severe diverticulosis, rpt 3 yrs [name])   CYSTOSCOPY WITH URETHRAL DILATATION N/A 07/24/2013   Procedure: CYSTOSCOPY WITH URETHRAL DILATATION;  Surgeon: [name], MD;  Location: [facility];  Service: Urology;  Laterality: N/A;   ESOPHAGOGASTRODUODENOSCOPY  2006   duodenitis, medual HH, Grade 2 reflux, mild gastritis   ESOPHAGOGASTRODUODENOSCOPY  07/2011   mild chronic gastritis   HEMORRHOID SURGERY  1991   INGUINAL HERNIA REPAIR  1981; 2002   left, then repaired   MRI brain  04/2010   WNL   PROSTATE SURGERY  02/2009   PVP (laser)   SKIN CANCER EXCISION  2002   bridge of nose, [facility]   TONSILLECTOMY AND ADENOIDECTOMY  ~ [place] N/A 07/24/2013   Procedure: TRANSURETHRAL INCISION OF THE PROSTATE (TUIP);  Surgeon: [name], MD;  Location: [facility];  Service: Urology;  Laterality: N/A;   URETHRAL DILATION  2014   [place]    Prior to Admission medications   Medication Sig Start Date End Date Taking? Authorizing Provider  amoxicillin-clavulanate (AUGMENTIN) 875-125 MG tablet Take 1 tablet by mouth 2 (two) times daily for 10 days. 03/31/21 04/10/21 Yes Ivonna Kinnick, [place], MD  Alpha-Lipoic Acid 600 MG CAPS Take 1 capsule (600 mg total) by mouth daily. Patient taking differently: Take 1 capsule by mouth in the morning and at bedtime. 12/07/19   [name], MD  BD PEN NEEDLE NANO 2ND GEN 32G X 4 MM MISC USE TO INJECT MEDICATION DAILY 01/12/21   [name], MD  celecoxib (CELEBREX) 200 MG capsule Take 1 capsule (200 mg total) by mouth daily. Patient taking differently: Take 200 mg by  mouth daily as needed for mild pain. 06/16/20   Copland, Frederico Hamman, MD  clopidogrel (PLAVIX) 75 MG tablet TAKE 1 TABLET DAILY 01/11/21   [name], MD  Coenzyme Q10 (COQ10) 150 MG CAPS Take 1 capsule by mouth daily.    [provider]  diclofenac Sodium (VOLTAREN) 1 % GEL Apply 2 g topically 3 (three) times daily as needed. Patient taking differently: Apply 2 g topically 3 (three) times daily as needed (pain). 06/04/20   [name], MD  dorzolamide-timolol (COSOPT) 22.3-6.8 MG/ML ophthalmic solution  12/17/20   [provider]  DULoxetine (CYMBALTA) 60 MG capsule TAKE 1 CAPSULE DAILY 08/03/20   [name], MD  famotidine (PEPCID) 20 MG tablet Take 1 tablet (20 mg total) by mouth at bedtime. Patient taking differently: Take 20 mg by  mouth daily. PRN 07/16/19   [name], MD  gabapentin (NEURONTIN) 300 MG capsule Take 600 mg by mouth 3 (three) times daily. 600 mg BID is his minimum dose without having breakthrough symptoms. He is aware 900 mg/day is recommended per kidney function. 11/15/20   [name], MD  insulin degludec (TRESIBA FLEXTOUCH) 200 UNIT/ML FlexTouch Pen Inject 90 Units into the skin at bedtime. As needed 08/03/20   [name], MD  insulin lispro (HUMALOG) 100 UNIT/ML KwikPen Inject 20 Units into the skin daily at 12 noon. With largest meal (lunch) as well as occasionally with dinner; Sliding Scale prior to meals if needed 03/08/21   [name], MD  l-methylfolate-B6-B12 [facility]) 3-35-2 MG TABS tablet Take 1 tablet by mouth daily. 07/07/20   Swayze, Ava, DO  levothyroxine (SYNTHROID) 50 MCG tablet Take 1 tablet (50 mcg total) by mouth daily before breakfast. Per endo [name] 12/07/19   [name], MD  LORazepam (ATIVAN) 0.5 MG tablet Take 1 tablet (0.5 mg total) by mouth at bedtime as needed for sleep. 11/30/20   [name], MD  LUMIGAN 0.01 % SOLN Place 1 drop into the right eye at bedtime. 06/01/20   [name], MD  melatonin 5 MG TABS Take 5 mg by mouth at bedtime. 05/24/18   [provider]  metoprolol tartrate (LOPRESSOR) 25 MG tablet TAKE ONE-HALF (1/2) TABLET (12.5 MG) AT BEDTIME WITH MORNING DOSE IF NEEDED Patient taking differently: Take 25 mg by mouth at bedtime. PRN palpitations 04/13/20   [name], MD  nitroGLYCERIN (NITROSTAT) 0.4 MG SL tablet DISSOLVE 1 TABLET UNDER THE TONGUE EVERY 5 MINUTES AS NEEDED FOR CHEST PAIN Patient taking differently: Place 0.4 mg under the tongue every 5 (five) minutes as needed for chest pain. 07/28/16   [name], MD  Omega-3 Fatty Acids (FISH OIL) 1000 MG CAPS Take 1 capsule (1,000 mg total) by mouth in the morning and at bedtime. 06/01/20   [name], MD  ondansetron (ZOFRAN) 4 MG tablet Take 1 tablet (4 mg total) by mouth every 8 (eight) hours as needed for nausea or vomiting. 11/10/20   [name], MD  pramipexole (MIRAPEX) 1 MG tablet Take 1 tablet (1 mg total) by mouth See admin instructions. At 4pm 12/30/20   [name], MD  RABEprazole (ACIPHEX) 20 MG tablet TAKE 1 TABLET DAILY 08/03/20   [name], MD  rosuvastatin (CRESTOR) 40 MG tablet Take 1 tablet (40 mg total) by mouth daily. 04/22/20   [name], MD  Semaglutide, 1 MG/DOSE, (OZEMPIC, 1 MG/DOSE,) 2 MG/1.5ML SOPN Inject 1 mg into the skin every Saturday. 06/08/20   [provider]  senna-docusate (SENOKOT-S) 8.6-50 MG tablet Take 1 tablet by mouth at bedtime. 11/05/20   Florencia Reasons, MD  tamsulosin [facility]) 0.4 MG CAPS capsule Take 1 capsule (0.4 mg total) by mouth daily after breakfast. 12/30/20   [name], MD  valsartan (DIOVAN) 40 MG tablet Take 0.5 tablets (20 mg total) by mouth daily. 12/23/20   [name], MD  vitamin C (ASCORBIC ACID) 500 MG tablet Take 1,000 mg by mouth daily as needed.    [provider]    Allergies Crestor [rosuvastatin], Dulaglutide, Empagliflozin, Gabapentin, Lisinopril, Nexium  [esomeprazole], Niacin and related, Oxycodone, and Pregabalin  Family History  Problem Relation Age of Onset   Stroke Father    Lung cancer Father 38        smoker   Stroke Brother    Diabetes Brother 37  Lung cancer Brother    Alcoholism Sister    Kidney disease Sister    Coronary artery disease Neg Hx     Social History Social History   Tobacco Use   Smoking status: Never   Smokeless tobacco: Never   Tobacco comments:    "used to smoke pipe when I was younger"  Vaping Use   Vaping Use: Never used  Substance Use Topics   Alcohol use: Yes    Alcohol/week: 4.0 standard drinks    Types: 2 Glasses of wine, 2 Shots of liquor per week    Comment: occasional   Drug use: No    Review of Systems  Constitutional: Negative for fever. Eyes: Negative for visual changes. ENT: Negative for facial injury. + neck pain Cardiovascular: Negative for chest injury. Respiratory: Negative for shortness of breath. Negative for chest wall injury. Gastrointestinal: Negative for abdominal pain or injury. Genitourinary: Negative for dysuria. Musculoskeletal: Negative for back injury, + right shoulder, right elbow, right hip, right knee pain Skin: Negative for laceration/abrasions. Neurological: Negative for head injury.   ____________________________________________   PHYSICAL EXAM:  VITAL SIGNS: ED Triage Vitals  Enc Vitals Group     BP 03/31/21 0331 (!) 145/60     Pulse Rate 03/31/21 0331 87     Resp 03/31/21 0331 20     Temp 03/31/21 0331 98.4 F (36.9 C)     Temp Source 03/31/21 0331 Oral     SpO2 03/31/21 0331 97 %     Weight 03/31/21 0331 190 lb (86.2 kg)     Height 03/31/21 0331 5\' 8"  (1.727 m)     Head Circumference --      Peak Flow --      Pain Score 03/31/21 0332 3     Pain Loc --      Pain Edu? --      Excl. in [place]? --     Full spinal precautions maintained throughout the trauma exam. Constitutional: Alert and oriented. No acute distress. Does not appear  intoxicated. HEENT Head: Normocephalic and atraumatic. Face: No facial bony tenderness. Stable midface Ears: No hemotympanum bilaterally. No Battle sign Eyes: No eye injury. PERRL. No raccoon eyes Nose: Nontender. No epistaxis. No rhinorrhea Mouth/Throat: Mucous membranes are moist. No oropharyngeal blood. No dental injury. Airway patent without stridor. Normal voice. Neck: no C-collar. No midline c-spine tenderness.  Cardiovascular: Normal rate, regular rhythm. Normal and symmetric distal pulses are present in all extremities. Pulmonary/Chest: Chest wall is stable and nontender to palpation/compression. Normal respiratory effort. Breath sounds are normal. No crepitus.  Abdominal: Soft, nontender, non distended. Musculoskeletal: Nontender with normal full range of motion in all extremities. No deformities. No thoracic or lumbar midline spinal tenderness. Pelvis is stable. Skin: Skin is warm, dry and intact. No abrasions or contutions. Psychiatric: Speech and behavior are appropriate. Neurological: Normal speech and language. Moves all extremities to command. No gross focal neurologic deficits are appreciated.  Glascow Coma Score: 4 - Opens eyes on own 6 - Follows simple motor commands 5 - Alert and oriented GCS: 15   ____________________________________________   LABS (all labs ordered are listed, but only abnormal results are displayed)  Labs Reviewed  CBC WITH DIFFERENTIAL/PLATELET - Abnormal; Notable for the following components:      Result Value   WBC 15.4 (*)    Neutro Abs 12.9 (*)    Abs Immature Granulocytes 0.09 (*)    All other components within normal limits  BASIC METABOLIC PANEL - Abnormal;  Notable for the following components:   Glucose, Bld 220 (*)    BUN 28 (*)    Creatinine, Ser 1.52 (*)    Calcium 8.8 (*)    GFR, Estimated 44 (*)    All other components within normal limits  URINALYSIS, COMPLETE (UACMP) WITH MICROSCOPIC - Abnormal; Notable for the  following components:   Color, Urine YELLOW (*)    APPearance HAZY (*)    Glucose, UA >=500 (*)    Protein, ur 30 (*)    Nitrite POSITIVE (*)    Leukocytes,Ua TRACE (*)    Bacteria, UA FEW (*)    All other components within normal limits  RESP PANEL BY RT-PCR (FLU A&B, COVID) [id number]   ____________________________________________  EKG  ED ECG REPORT I, Rudene Re, the attending physician, personally viewed and interpreted this ECG.  Sinus rhythm with rate of 87, normal intervals, left axis deviation, no ST elevations or depressions ____________________________________________  RADIOLOGY  I have personally reviewed the images performed during this visit and I agree with the Radiologist's read.   Interpretation by Radiologist:  DG Chest 1 View  Result Date: 03/31/2021 CLINICAL DATA:  87 year old male status post falls. Pain. EXAM: CHEST  1 VIEW COMPARISON:  Portable chest 11/02/2020 and earlier. FINDINGS: Supine AP views at 0426 hours. Lower lung volumes. Mediastinal contours are stable and within normal limits. Visualized tracheal air column is within normal limits. Allowing for portable technique the lungs are clear. Mild apical pleural thickening is stable. No pneumothorax or pleural effusion evident on these supine views. Stable visualized osseous structures. IMPRESSION: Lower lung volumes. No acute cardiopulmonary abnormality or acute traumatic injury identified. Electronically Signed   By: Genevie Ann M.D.   On: 03/31/2021 05:30   DG Shoulder Right  Result Date: 03/31/2021 CLINICAL DATA:  87 year old male status post falls. Pain. EXAM: RIGHT SHOULDER - 2+ VIEW COMPARISON:  Chest radiographs 03/18/2015. FINDINGS: Bone mineralization is within normal limits for age. There is no evidence of fracture or dislocation. Normal glenohumeral alignment. Proximal right humerus appears intact. Mild for age degenerative spurring at the right [facility] joint. Visible right ribs appear intact.  IMPRESSION: No acute fracture or dislocation identified about the right shoulder. Electronically Signed   By: Genevie Ann M.D.   On: 03/31/2021 05:29   DG Elbow Complete Right  Result Date: 03/31/2021 CLINICAL DATA:  87 year old male status post falls. Pain. EXAM: RIGHT ELBOW - COMPLETE 3+ VIEW COMPARISON:  None. FINDINGS: Lateral view is slightly oblique. Bone mineralization is within normal limits for age. There is no evidence of fracture, dislocation, or joint effusion. No significant degenerative changes. No discrete soft tissue injury. IMPRESSION: No acute fracture or dislocation identified about the right elbow. Electronically Signed   By: Genevie Ann M.D.   On: 03/31/2021 05:27   CT Head Wo Contrast  Result Date: 03/31/2021 CLINICAL DATA:  87 year old male status post falls, collapse. EXAM: CT HEAD WITHOUT CONTRAST TECHNIQUE: Contiguous axial images were obtained from the base of the skull through the vertex without intravenous contrast. COMPARISON:  Brain MRI 11/02/2020.  Head CT 03/22/2021. FINDINGS: Brain: Advanced chronic small vessel disease in the left corona radiata and lentiform appears stable. Stable gray-white matter differentiation elsewhere. No midline shift, ventriculomegaly, mass effect, evidence of mass lesion, intracranial hemorrhage or evidence of cortically based acute infarction. Vascular: Calcified atherosclerosis at the skull base. No suspicious intracranial vascular hyperdensity. Skull: Stable, intact. Small imbedded metallic foreign body at the vertex is stable (series 2, image 75).  Sinuses/Orbits: Visualized paranasal sinuses and mastoids are stable and well aerated. Other: No orbit or scalp soft tissue injury identified. IMPRESSION: 1. No acute intracranial abnormality or acute traumatic injury identified. 2. Chronic small vessel disease in the left corona radiata and lentiform. Electronically Signed   By: Genevie Ann M.D.   On: 03/31/2021 04:56   CT Cervical Spine Wo Contrast  Result  Date: 03/31/2021 CLINICAL DATA:  87 year old male status post falls, collapse. EXAM: CT CERVICAL SPINE WITHOUT CONTRAST TECHNIQUE: Multidetector CT imaging of the cervical spine was performed without intravenous contrast. Multiplanar CT image reconstructions were also generated. COMPARISON:  Cervical spine CT 07/01/2020. Head CT today reported separately. FINDINGS: Alignment: Stable, mildly exaggerated cervical lordosis. Cervicothoracic junction alignment is within normal limits. Posterior element alignment is stable. Skull base and vertebrae: Visualized skull base is intact. No atlanto-occipital dissociation. Stable bone mineralization. C1 and C2 appear stable and intact. No acute osseous abnormality identified. Chronic interbody ankylosis C6 through T1 from flowing anterior endplate osteophytes. Soft tissues and spinal canal: No prevertebral fluid or swelling. No visible canal hematoma. Negative noncontrast neck soft tissues. Disc levels: Stable cervical spine degeneration. Fairly capacious cervical spinal canal. Upper chest: Stable visible upper chest. IMPRESSION: 1. No acute traumatic injury identified in the cervical spine. 2. Chronic cervical spine degeneration. Electronically Signed   By: Genevie Ann M.D.   On: 03/31/2021 04:59   DG Knee Complete 4 Views Right  Result Date: 03/31/2021 CLINICAL DATA:  87 year old male status post falls. Pain. EXAM: RIGHT KNEE - COMPLETE 4+ VIEW COMPARISON:  None. FINDINGS: Bone mineralization is within normal limits for age. No evidence of fracture, dislocation, or joint effusion. No significant degenerative changes. No discrete soft tissue injury. IMPRESSION: No acute fracture or dislocation identified about the right knee. Electronically Signed   By: Genevie Ann M.D.   On: 03/31/2021 05:32   DG Hip Unilat W or Wo Pelvis 2-3 Views Right  Result Date: 03/31/2021 CLINICAL DATA:  87 year old male status post falls.  Pain. EXAM: DG HIP (WITH OR WITHOUT PELVIS) 2-3V RIGHT  COMPARISON:  CT Abdomen and Pelvis 07/01/2020. FINDINGS: Bone mineralization is within normal limits for age. There is no evidence of hip fracture or dislocation. Pelvis appears intact. Grossly intact proximal left femur. Incidental scrotal surgical clips. IMPRESSION: No acute fracture or dislocation identified about the right hip or pelvis. Electronically Signed   By: Genevie Ann M.D.   On: 03/31/2021 05:26     ____________________________________________   PROCEDURES  Procedure(s) performed:yes .1-3 Lead EKG Interpretation  Date/Time: 03/31/2021 4:33 AM Performed by: Rudene Re, MD Authorized by: Rudene Re, MD     Interpretation: non-specific     ECG rate assessment: normal     Rhythm: sinus rhythm     Ectopy: none     Conduction: normal    Critical Care performed:  None ____________________________________________   INITIAL IMPRESSION / ASSESSMENT AND PLAN / ED COURSE  87 y.o. male with history of CVA with right-sided hemiplegia, CKD, BPH, anemia, hyperlipidemia, OSA on CPAP, diabetes who presents for evaluation after fall.  Patient reports that his legs gave out and he fell onto the right side.  He denies any dizziness.  EKG shows no signs of dysrhythmias.  Patient placed on telemetry for monitoring of cardiorespiratory status and to evaluate for any signs of dysrhythmias.  We will get basic labs to rule out dehydration, sepsis, UTI, anemia, electrolyte derangements, AKI as possible etiologies of patient's generalized weakness and falls.  Will  check for COVID and flu.  Will get head CT, CT cervical spine, x-rays to evaluate for any traumatic injuries.  _________________________ 6:40 AM on 03/31/2021 ----------------------------------------- COVID and flu negative.  UA positive for UTI with no signs of sepsis.  Patient does have a white count but no fever or tachycardia.  Patient's prior urine cultures reviewed showing Klebsiella susceptible to Rocephin Augmentin.   Patient given a dose of Rocephin and will be discharged home on Augmentin.  Imaging studies reviewed by me with no acute traumatic injuries, confirmed by radiology.  Discussed my standard return precautions.      ____________________________________________  Please note:  Patient was evaluated in Emergency Department today for the symptoms described in the history of present illness. Patient was evaluated in the context of the global COVID-19 pandemic, which necessitated consideration that the patient might be at risk for infection with the SARS-CoV-2 virus that causes COVID-19. Institutional protocols and algorithms that pertain to the evaluation of patients at risk for COVID-19 are in a state of rapid change based on information released by regulatory bodies including the CDC and federal and state organizations. These policies and algorithms were followed during the patient's care in the ED.  Some ED evaluations and interventions may be delayed as a result of limited staffing during the pandemic.   ____________________________________________   FINAL CLINICAL IMPRESSION(S) / ED DIAGNOSES   Final diagnoses:  Fall, initial encounter  Acute cystitis without hematuria      NEW MEDICATIONS STARTED DURING THIS VISIT:  ED Discharge Orders          Ordered    amoxicillin-clavulanate (AUGMENTIN) 875-125 MG tablet  2 times daily        03/31/21 0640             Note:  This document was prepared using Dragon voice recognition software and may include unintentional dictation errors.    Alfred Levins, [place], MD 03/31/21 [phone]

## 2021-03-31 NOTE — ED Notes (Signed)
Unable to obtain Urine at this time.

## 2021-03-31 NOTE — Discharge Instructions (Addendum)

## 2021-03-31 NOTE — ED Triage Notes (Signed)
Pt in via EMS from [facility]. Pt was d/c from the ED this am, has not got his prescription filled yet and is not feeling better so wanted to come back.   Pt daughter Lanette [place] [phone], call for transport

## 2021-03-31 NOTE — ED Notes (Signed)
Assisting pt with urinal at this time to obtain UA specimen collection.

## 2021-03-31 NOTE — ED Provider Notes (Signed)
[facility] Emergency Department Provider Note   ____________________________________________   Event Date/Time   First MD Initiated Contact with Patient 03/31/21 2105     (approximate)  I have reviewed the triage vital signs and the nursing notes.   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  Patient reports that he has been feeling increasingly weak for the past 3 days, he is typically able to get around using a walker but has been unable to walk for least the past 24 hours.  He was seen in the ED overnight last night due to fall, had unremarkable work-up for traumatic injury but was found to have UTI.  He was given a dose of Rocephin and prescribed Augmentin, but he states he was too weak to pick up this medication from the pharmacy.  He endorses diffuse body aches along with pain in his suprapubic area.  He denies any fevers, cough, chest pain, shortness of breath, nausea, or vomiting.        Past Medical History:  Diagnosis Date   Anemia    Basal cell carcinoma 2018   R temple    Basal cell carcinoma of nose 2002   bridge of nose   BPH with obstruction/lower urinary tract symptoms    failed flomax, uroxatral, myrbetriq - sees urology [name]) pending videourodynamics   CKD (chronic kidney disease) stage 3, GFR 30-59 ml/min ([place]) 03/11/2012   Renal US 02/2014 - multiple kidney cysts    CVA (cerebral infarction) 03/07/12   slurred speech; right hemplegia, left handed - completed PT 07/2014 (17 sessions)   Depression    mild, on cymbalta per prior PCP   Erectile dysfunction    per urology (prostaglandin injection)   GERD (gastroesophageal reflux disease)    with sliding HH   H/O hiatal hernia    History of kidney  problems 12/2010   lacerated left kidney after fall   HLD (hyperlipidemia)    Hx of basal cell carcinoma 07/10/2017   R. lat. forehead near hair line   Kidney cysts 02/2014   bilateral (renal US)   Migraines 03/11/12   now rare   OSA on CPAP    sleep study 01/2014 - severe, CPAP at 10, previously on nuvigil (Dr. [name])   Palpitations 01/2012   holter WNL, rare PAC/PVCs   Presbycusis of both ears 2016   rec amplification [facility])   Restless leg syndrome    well controlled on mirapex   Rosacea    [name]   Stroke [facility])    TIA (transient ischemic attack)    Type 2 diabetes, controlled, with neuropathy ([place]) 2011   [name] in [facility]    Patient Active Problem List   Diagnosis Date Noted   Polypharmacy 03/10/2021   Acute encephalopathy 11/02/2020   Dysuria 10/18/2020   Healthcare maintenance 06/01/2020   Involuntary movements 04/25/2020   Post-nasal drainage 07/16/2019   Recurrent productive cough 05/31/2019   Hx of basal cell carcinoma 07/10/2017   Rosacea    Gait disturbance, post-stroke 10/05/2015   Presbycusis of both ears    Advanced care planning/counseling discussion 06/23/2015   Impaired mobility 04/23/2015   Hearing loss of right ear due to cerumen impaction 03/23/2015   HTN (hypertension) 24/40/1027   Diastolic dysfunction 25/36/6440  Spastic hemiparesis affecting dominant side ([place]) 07/07/2014   Chest pressure 02/03/2014   Medicare annual wellness visit, subsequent 11/06/2013   Erectile dysfunction due to arterial insufficiency 02/05/2013   MDD (major depressive disorder), recurrent episode, moderate ([place])    Palpitations 06/26/2012   Hyperlipidemia associated with type 2 diabetes mellitus ([place])    Restless leg syndrome    GERD (gastroesophageal reflux disease)    BPH with obstruction/lower urinary tract symptoms    History of CVA (cerebrovascular accident) 03/11/2012   Hemiparesis affecting right side as late effect of stroke ([place]) 03/11/2012   Type 2  diabetes mellitus, uncontrolled, with neuropathy ([place]) 03/11/2012   OSA on CPAP 03/11/2012   CKD stage 3 secondary to diabetes ([place]) 03/11/2012   Migraines 03/11/2012   Thrombocytopenia ([place]) 2011   Cause of injury, fall 2011    Past Surgical History:  Procedure Laterality Date   ABI  2007   WNL   CARDIAC CATHETERIZATION  2004   normal; [place]   CARDIOVASCULAR STRESS TEST  02/2014   WNL, low risk scan, EF 68%   CAROTID ENDARTERECTOMY     CATARACT EXTRACTION W/ INTRAOCULAR LENS IMPLANT  11//2012 - 08/2011   COLONOSCOPY  03/2011   poor prep, unable to biopsy polyp in tic fold, rpt 3 mo [name])   COLONOSCOPY  06/2011   mult polyps adenomatous, severe diverticulosis, rpt 3 yrs [name])   CYSTOSCOPY WITH URETHRAL DILATATION N/A 07/24/2013   Procedure: CYSTOSCOPY WITH URETHRAL DILATATION;  Surgeon: [name], MD;  Location: [facility] ORS;  Service: Urology;  Laterality: N/A;   ESOPHAGOGASTRODUODENOSCOPY  2006   duodenitis, medual HH, Grade 2 reflux, mild gastritis   ESOPHAGOGASTRODUODENOSCOPY  07/2011   mild chronic gastritis   HEMORRHOID SURGERY  1991   INGUINAL HERNIA REPAIR  1981; 2002   left, then repaired   MRI brain  04/2010   WNL   PROSTATE SURGERY  02/2009   PVP (laser)   SKIN CANCER EXCISION  2002   bridge of nose, BCC   TONSILLECTOMY AND ADENOIDECTOMY  ~ [place] N/A 07/24/2013   Procedure: TRANSURETHRAL INCISION OF THE PROSTATE (TUIP);  Surgeon: [name], MD;  Location: [facility] ORS;  Service: Urology;  Laterality: N/A;   URETHRAL DILATION  2014   [place]    Prior to Admission medications   Medication Sig Start Date End Date Taking? Authorizing Provider  Alpha-Lipoic Acid 600 MG CAPS Take 1 capsule (600 mg total) by mouth daily. Patient taking differently: Take 1 capsule by mouth in the morning and at bedtime. 12/07/19   [name], MD  amoxicillin-clavulanate (AUGMENTIN) 875-125 MG tablet Take 1 tablet by mouth 2  (two) times daily for 10 days. 03/31/21 04/10/21  Rudene Re, MD  BD PEN NEEDLE NANO 2ND GEN 32G X 4 MM MISC USE TO INJECT MEDICATION DAILY 01/12/21   [name], MD  brimonidine [facility]) 0.2 % ophthalmic solution  03/25/21   [provider]  celecoxib (CELEBREX) 200 MG capsule Take 1 capsule (200 mg total) by mouth daily. Patient taking differently: Take 200 mg by mouth daily as needed for mild pain. 06/16/20   Copland, Frederico Hamman, MD  cephALEXin (KEFLEX) 500 MG capsule  03/25/21   [provider]  clopidogrel (PLAVIX) 75 MG tablet TAKE 1 TABLET DAILY 01/11/21   [name], MD  Coenzyme Q10 (COQ10) 150 MG CAPS Take 1 capsule by mouth daily.    [provider]  COMBIGAN 0.2-0.5 % ophthalmic  solution  03/25/21   [provider]  diclofenac Sodium (VOLTAREN) 1 % GEL Apply 2 g topically 3 (three) times daily as needed. Patient taking differently: Apply 2 g topically 3 (three) times daily as needed (pain). 06/04/20   [name], MD  dorzolamide-timolol (COSOPT) 22.3-6.8 MG/ML ophthalmic solution  12/17/20   [provider]  DULoxetine (CYMBALTA) 60 MG capsule TAKE 1 CAPSULE DAILY 08/03/20   [name], MD  famotidine (PEPCID) 20 MG tablet Take 1 tablet (20 mg total) by mouth at bedtime. Patient taking differently: Take 20 mg by mouth daily. PRN 07/16/19   [name], MD  Fluocinolone Acetonide 0.01 % OIL  03/25/21   [provider]  gabapentin (NEURONTIN) 300 MG capsule Take 600 mg by mouth 3 (three) times daily. 600 mg BID is his minimum dose without having breakthrough symptoms. He is aware 900 mg/day is recommended per kidney function. 11/15/20   [name], MD  insulin degludec (TRESIBA FLEXTOUCH) 200 UNIT/ML FlexTouch Pen Inject 90 Units into the skin at bedtime. As needed 08/03/20   [name], MD  insulin lispro (HUMALOG) 100 UNIT/ML KwikPen Inject 20 Units into the skin daily at 12 noon. With largest  meal (lunch) as well as occasionally with dinner; Sliding Scale prior to meals if needed 03/08/21   [name], MD  ipratropium (ATROVENT) 0.03 % nasal spray  03/25/21   [provider]  JARDIANCE 10 MG TABS tablet  03/25/21   [provider]  l-methylfolate-B6-B12 (METANX) 3-35-2 MG TABS tablet Take 1 tablet by mouth daily. 07/07/20   Swayze, Ava, DO  levothyroxine (SYNTHROID) 50 MCG tablet Take 1 tablet (50 mcg total) by mouth daily before breakfast. Per endo [name] 12/07/19   [name], MD  LORazepam (ATIVAN) 0.5 MG tablet Take 1 tablet (0.5 mg total) by mouth at bedtime as needed for sleep. 11/30/20   [name], MD  LUMIGAN 0.01 % SOLN Place 1 drop into the right eye at bedtime. 06/01/20   [name], MD  melatonin 5 MG TABS Take 5 mg by mouth at bedtime. 05/24/18   [provider]  metoprolol tartrate (LOPRESSOR) 25 MG tablet TAKE ONE-HALF (1/2) TABLET (12.5 MG) AT BEDTIME WITH MORNING DOSE IF NEEDED Patient taking differently: Take 25 mg by mouth at bedtime. PRN palpitations 04/13/20   [name], MD  nitroGLYCERIN (NITROSTAT) 0.4 MG SL tablet DISSOLVE 1 TABLET UNDER THE TONGUE EVERY 5 MINUTES AS NEEDED FOR CHEST PAIN Patient taking differently: Place 0.4 mg under the tongue every 5 (five) minutes as needed for chest pain. 07/28/16   [name], MD  ofloxacin (FLOXIN) 0.3 % OTIC solution  03/25/21   [provider]  Omega-3 Fatty Acids (FISH OIL) 1000 MG CAPS Take 1 capsule (1,000 mg total) by mouth in the morning and at bedtime. 06/01/20   [name], MD  ondansetron (ZOFRAN) 4 MG tablet Take 1 tablet (4 mg total) by mouth every 8 (eight) hours as needed for nausea or vomiting. 11/10/20   [name], MD  pramipexole (MIRAPEX) 1 MG tablet Take 1 tablet (1 mg total) by mouth See admin instructions. At 4pm 12/30/20   [name], MD  RABEprazole (ACIPHEX) 20 MG tablet TAKE 1 TABLET DAILY 08/03/20   [name], MD  rosuvastatin (CRESTOR) 40 MG tablet Take 1 tablet (40 mg total) by mouth daily. 04/22/20   [name], MD  Semaglutide, 1 MG/DOSE, (OZEMPIC, 1 MG/DOSE,) 2 MG/1.5ML SOPN Inject 1 mg into the skin  every Saturday. 06/08/20   [provider]  senna-docusate (SENOKOT-S) 8.6-50 MG tablet Take 1 tablet by mouth at bedtime. 11/05/20   Florencia Reasons, MD  sulfamethoxazole-trimethoprim (BACTRIM DS) 800-160 MG tablet  03/25/21   [provider]  tamsulosin (FLOMAX) 0.4 MG CAPS capsule Take 1 capsule (0.4 mg total) by mouth daily after breakfast. 12/30/20   [name], MD  valsartan (DIOVAN) 40 MG tablet Take 0.5 tablets (20 mg total) by mouth daily. 12/23/20   [name], MD  vitamin C (ASCORBIC ACID) 500 MG tablet Take 1,000 mg by mouth daily as needed.    [provider]    Allergies Crestor [rosuvastatin], Dulaglutide, Empagliflozin, Gabapentin, Lisinopril, Nexium [esomeprazole], Niacin and related, Oxycodone, and Pregabalin  Family History  Problem Relation Age of Onset   Stroke Father    Lung cancer Father 24        smoker   Stroke Brother    Diabetes Brother 44   Lung cancer Brother    Alcoholism Sister    Kidney disease Sister    Coronary artery disease Neg Hx     Social History Social History   Tobacco Use   Smoking status: Never   Smokeless tobacco: Never   Tobacco comments:    "used to smoke pipe when I was youngerFinancial risk analyst Use: Never used  Substance Use Topics   Alcohol use: Yes    Alcohol/week: 4.0 standard drinks    Types: 2 Glasses of wine, 2 Shots of liquor per week    Comment: occasional   Drug use: No    Review of Systems  Constitutional: No fever/chills.  Positive for generalized weakness. Eyes: No visual changes. ENT: No sore throat. Cardiovascular: Denies chest pain. Respiratory: Denies shortness of breath. Gastrointestinal: Positive for abdominal pain.  No nausea, no vomiting.  No diarrhea.  No  constipation. Genitourinary: Negative for dysuria. Musculoskeletal: Negative for back pain. Skin: Negative for rash. Neurological: Negative for headaches, focal weakness or numbness.  ____________________________________________   PHYSICAL EXAM:  VITAL SIGNS: ED Triage Vitals  Enc Vitals Group     BP 03/31/21 1848 137/61     Pulse Rate 03/31/21 1848 88     Resp 03/31/21 1848 16     Temp 03/31/21 1848 98.7 F (37.1 C)     Temp src --      SpO2 03/31/21 1848 98 %     Weight 03/31/21 1849 185 lb (83.9 kg)     Height 03/31/21 1849 5\' 8"  (1.727 m)     Head Circumference --      Peak Flow --      Pain Score 03/31/21 1849 6     Pain Loc --      Pain Edu? --      Excl. in [place]? --     Constitutional: Alert and oriented. Eyes: Conjunctivae are normal. Head: Atraumatic. Nose: No congestion/rhinnorhea. Mouth/Throat: Mucous membranes are moist. Neck: Normal ROM Cardiovascular: Normal rate, regular rhythm. Grossly normal heart sounds. Respiratory: Normal respiratory effort.  No retractions. Lungs CTAB. Gastrointestinal: Soft and tender to palpation in the suprapubic area with no rebound or guarding. No distention. Genitourinary: deferred Musculoskeletal: No lower extremity tenderness nor edema. Neurologic:  Normal speech and language. No gross focal neurologic deficits are appreciated. Skin:  Skin is warm, dry and intact. No rash noted. Psychiatric: Mood and affect are normal. Speech and behavior are normal.  ____________________________________________   LABS (all labs ordered are listed, but only  abnormal results are displayed)  Labs Reviewed  CBC WITH DIFFERENTIAL/PLATELET - Abnormal; Notable for the following components:      Result Value   WBC 14.6 (*)    Platelets 148 (*)    Neutro Abs 11.6 (*)    Monocytes Absolute 1.1 (*)    All other components within normal limits  URINE CULTURE  SARS CORONAVIRUS 2 (TAT 6-24 HRS)  BASIC METABOLIC PANEL  URINALYSIS, COMPLETE  (UACMP) WITH MICROSCOPIC   ____________________________________________  EKG  ED ECG REPORT I, Blake Divine, the attending physician, personally viewed and interpreted this ECG.   Date: 03/31/2021  EKG Time: 22:38  Rate: 92  Rhythm: there are no previous tracings available for comparison  Axis: LAD  Intervals:left anterior fascicular block  ST&T Change: None   PROCEDURES  Procedure(s) performed (including Critical Care):  Procedures   ____________________________________________   INITIAL IMPRESSION / ASSESSMENT AND PLAN / ED COURSE      87 year old male with past medical history of hyperlipidemia, diabetes, hypertension, anemia, CKD, and stroke with right-sided deficits presents to the ED with increasing weakness over the past 3 days with fall overnight last night.  Patient noted to have UTI last night and this is likely the cause of his suprapubic pain along with his generalized weakness.  Vital signs are reassuring and not consistent with sepsis, we will send urine for culture and give dose of Rocephin.  We will plan to recheck labs and hydrate patient with IV fluids as he states he has not had anything to eat or drink today.      ____________________________________________   FINAL CLINICAL IMPRESSION(S) / ED DIAGNOSES  Final diagnoses:  Lower urinary tract infectious disease  Generalized weakness     ED Discharge Orders     None        Note:  This document was prepared using Dragon voice recognition software and may include unintentional dictation errors.    Blake Divine, MD 03/31/21 [phone]

## 2021-03-31 NOTE — ED Notes (Signed)
Patient to ED with multiple complaints. Patient dc'd from ED this AM with UTI and has not picked up antibiotics. Complaining of frequency, right knee pain (from recent falls, had x-ray that showed no fracture), headache, and is wanting glaucoma eye drops.

## 2021-04-01 ENCOUNTER — Encounter: Payer: Self-pay | Admitting: Family Medicine

## 2021-04-01 ENCOUNTER — Inpatient Hospital Stay: Payer: Medicare Other

## 2021-04-01 DIAGNOSIS — N183 Chronic kidney disease, stage 3 unspecified: Secondary | ICD-10-CM

## 2021-04-01 DIAGNOSIS — K219 Gastro-esophageal reflux disease without esophagitis: Secondary | ICD-10-CM

## 2021-04-01 DIAGNOSIS — N3000 Acute cystitis without hematuria: Secondary | ICD-10-CM

## 2021-04-01 DIAGNOSIS — E1122 Type 2 diabetes mellitus with diabetic chronic kidney disease: Secondary | ICD-10-CM

## 2021-04-01 DIAGNOSIS — I10 Essential (primary) hypertension: Secondary | ICD-10-CM

## 2021-04-01 LAB — URINALYSIS, COMPLETE (UACMP) WITH MICROSCOPIC
Bacteria, UA: NONE SEEN
Bilirubin Urine: NEGATIVE
Glucose, UA: 150 mg/dL — AB
Ketones, ur: NEGATIVE mg/dL
Nitrite: NEGATIVE
Protein, ur: 100 mg/dL — AB
Specific Gravity, Urine: 1.015 (ref 1.005–1.030)
Squamous Epithelial / HPF: NONE SEEN (ref 0–5)
WBC, UA: >50 WBC/hpf — ABNORMAL HIGH (ref 0–5)
pH: 6 (ref 5.0–8.0)

## 2021-04-01 LAB — GLUCOSE, CAPILLARY
Glucose-Capillary: 128 mg/dL — ABNORMAL HIGH (ref 70–99)
Glucose-Capillary: 55 mg/dL — ABNORMAL LOW (ref 70–99)
Glucose-Capillary: 87 mg/dL (ref 70–99)

## 2021-04-01 LAB — CBC
HCT: 38.8 % — ABNORMAL LOW (ref 39.0–52.0)
Hemoglobin: 13.3 g/dL (ref 13.0–17.0)
MCH: 30.4 pg (ref 26.0–34.0)
MCHC: 34.3 g/dL (ref 30.0–36.0)
MCV: 88.8 fL (ref 80.0–100.0)
Platelets: 139 10*3/uL — ABNORMAL LOW (ref 150–400)
RBC: 4.37 MIL/uL (ref 4.22–5.81)
RDW: 13.4 % (ref 11.5–15.5)
WBC: 12.4 10*3/uL — ABNORMAL HIGH (ref 4.0–10.5)
nRBC: 0 % (ref 0.0–0.2)

## 2021-04-01 LAB — BASIC METABOLIC PANEL
Anion gap: 4 — ABNORMAL LOW (ref 5–15)
BUN: 23 mg/dL (ref 8–23)
CO2: 27 mmol/L (ref 22–32)
Calcium: 8.3 mg/dL — ABNORMAL LOW (ref 8.9–10.3)
Chloride: 108 mmol/L (ref 98–111)
Creatinine, Ser: 1.37 mg/dL — ABNORMAL HIGH (ref 0.61–1.24)
GFR, Estimated: 50 mL/min — ABNORMAL LOW (ref >60)
Glucose, Bld: 74 mg/dL (ref 70–99)
Potassium: 3.6 mmol/L (ref 3.5–5.1)
Sodium: 139 mmol/L (ref 135–145)

## 2021-04-01 LAB — SARS CORONAVIRUS 2 (TAT 6-24 HRS): SARS Coronavirus 2: NEGATIVE

## 2021-04-01 MED ORDER — MELATONIN 5 MG PO TABS
5.0000 mg | ORAL_TABLET | Freq: Every day | ORAL | Status: DC
  Administered 2021-04-01 – 2021-04-05 (×5): 5 mg via ORAL
  Filled 2021-04-01 (×5): qty 1

## 2021-04-01 MED ORDER — PRAMIPEXOLE DIHYDROCHLORIDE 1 MG PO TABS
1.0000 mg | ORAL_TABLET | Freq: Every day | ORAL | Status: DC
  Administered 2021-04-01 – 2021-04-05 (×5): 1 mg via ORAL
  Filled 2021-04-01 (×6): qty 1

## 2021-04-01 MED ORDER — L-METHYLFOLATE-B6-B12 3-35-2 MG PO TABS: 1.0000 | ORAL_TABLET | Freq: Every day | ORAL | Status: DC

## 2021-04-01 MED ORDER — GABAPENTIN 300 MG PO CAPS: 600.0000 mg | ORAL_CAPSULE | Freq: Three times a day (TID) | ORAL | Status: DC

## 2021-04-01 MED ORDER — CELECOXIB 200 MG PO CAPS: 200.0000 mg | ORAL_CAPSULE | Freq: Every day | ORAL | Status: DC | PRN

## 2021-04-01 MED ORDER — BISACODYL 5 MG PO TBEC: 5.0000 mg | DELAYED_RELEASE_TABLET | Freq: Every day | ORAL | Status: DC | PRN

## 2021-04-01 MED ORDER — ACETAMINOPHEN 325 MG PO TABS: 650.0000 mg | ORAL_TABLET | Freq: Four times a day (QID) | ORAL | Status: DC | PRN

## 2021-04-01 MED ORDER — INSULIN GLARGINE 100 UNIT/ML ~~LOC~~ SOLN
90.0000 [IU] | Freq: Every day | SUBCUTANEOUS | Status: DC
  Administered 2021-04-01: 90 [IU] via SUBCUTANEOUS
  Filled 2021-04-01 (×3): qty 0.9

## 2021-04-01 MED ORDER — ROSUVASTATIN CALCIUM 10 MG PO TABS
40.0000 mg | ORAL_TABLET | Freq: Every day | ORAL | Status: DC
  Administered 2021-04-01 – 2021-04-06 (×6): 40 mg via ORAL
  Filled 2021-04-01 (×4): qty 4
  Filled 2021-04-01: qty 8
  Filled 2021-04-01: qty 4

## 2021-04-01 MED ORDER — CO Q-10 150 MG PO CAPS: 1.0000 | ORAL_CAPSULE | Freq: Every day | ORAL | Status: DC

## 2021-04-01 MED ORDER — GABAPENTIN 300 MG PO CAPS
600.0000 mg | ORAL_CAPSULE | Freq: Four times a day (QID) | ORAL | Status: DC
  Administered 2021-04-01 – 2021-04-06 (×21): 600 mg via ORAL
  Filled 2021-04-01 (×21): qty 2

## 2021-04-01 MED ORDER — METOPROLOL TARTRATE 25 MG PO TABS: 25.0000 mg | ORAL_TABLET | Freq: Every evening | ORAL | Status: DC | PRN

## 2021-04-01 MED ORDER — MAGNESIUM HYDROXIDE 400 MG/5ML PO SUSP: 30.0000 mL | Freq: Every day | ORAL | Status: DC | PRN

## 2021-04-01 MED ORDER — SENNOSIDES-DOCUSATE SODIUM 8.6-50 MG PO TABS: 1.0000 | ORAL_TABLET | Freq: Every day | ORAL | Status: DC

## 2021-04-01 MED ORDER — DOCUSATE SODIUM 100 MG PO CAPS: 100.0000 mg | ORAL_CAPSULE | Freq: Two times a day (BID) | ORAL | Status: DC | PRN

## 2021-04-01 MED ORDER — IPRATROPIUM BROMIDE 0.03 % NA SOLN
1.0000 | Freq: Two times a day (BID) | NASAL | Status: DC
  Administered 2021-04-01 – 2021-04-06 (×9): 1 via NASAL
  Filled 2021-04-01: qty 30

## 2021-04-01 MED ORDER — NITROGLYCERIN 0.4 MG SL SUBL: 0.4000 mg | SUBLINGUAL_TABLET | SUBLINGUAL | Status: DC | PRN

## 2021-04-01 MED ORDER — SEMAGLUTIDE (1 MG/DOSE) 2 MG/1.5ML ~~LOC~~ SOPN: 1.0000 mg | PEN_INJECTOR | SUBCUTANEOUS | Status: DC

## 2021-04-01 MED ORDER — ACETAMINOPHEN 650 MG RE SUPP: 650.0000 mg | Freq: Four times a day (QID) | RECTAL | Status: DC | PRN

## 2021-04-01 MED ORDER — ALPHA-LIPOIC ACID 600 MG PO CAPS: 1.0000 | ORAL_CAPSULE | Freq: Every day | ORAL | Status: DC

## 2021-04-01 MED ORDER — ONDANSETRON HCL 4 MG/2ML IJ SOLN: 4.0000 mg | Freq: Four times a day (QID) | INTRAMUSCULAR | Status: DC | PRN

## 2021-04-01 MED ORDER — OMEGA-3-ACID ETHYL ESTERS 1 G PO CAPS
1.0000 g | ORAL_CAPSULE | Freq: Two times a day (BID) | ORAL | Status: DC
  Administered 2021-04-01 – 2021-04-06 (×11): 1 g via ORAL
  Filled 2021-04-01 (×11): qty 1

## 2021-04-01 MED ORDER — FLEET ENEMA 7-19 GM/118ML RE ENEM: 1.0000 | ENEMA | Freq: Every day | RECTAL | Status: DC | PRN

## 2021-04-01 MED ORDER — TRAZODONE HCL 50 MG PO TABS
25.0000 mg | ORAL_TABLET | Freq: Every evening | ORAL | Status: DC | PRN
  Administered 2021-04-01: 25 mg via ORAL
  Filled 2021-04-01: qty 1

## 2021-04-01 MED ORDER — ENOXAPARIN SODIUM 40 MG/0.4ML IJ SOSY
40.0000 mg | PREFILLED_SYRINGE | INTRAMUSCULAR | Status: DC
  Administered 2021-04-01 – 2021-04-06 (×6): 40 mg via SUBCUTANEOUS
  Filled 2021-04-01 (×6): qty 0.4

## 2021-04-01 MED ORDER — SODIUM CHLORIDE 0.9 % IV SOLN
1.0000 g | INTRAVENOUS | Status: DC
  Administered 2021-04-01: 1 g via INTRAVENOUS
  Filled 2021-04-01: qty 1
  Filled 2021-04-01: qty 10

## 2021-04-01 MED ORDER — ONDANSETRON HCL 4 MG PO TABS: 4.0000 mg | ORAL_TABLET | Freq: Three times a day (TID) | ORAL | Status: DC | PRN

## 2021-04-01 MED ORDER — SODIUM CHLORIDE 0.9 % IV SOLN: INTRAVENOUS | Status: DC

## 2021-04-01 MED ORDER — LEVOTHYROXINE SODIUM 50 MCG PO TABS
50.0000 ug | ORAL_TABLET | Freq: Every day | ORAL | Status: DC
  Administered 2021-04-01 – 2021-04-06 (×6): 50 ug via ORAL
  Filled 2021-04-01 (×6): qty 1

## 2021-04-01 MED ORDER — ASCORBIC ACID 500 MG PO TABS
1000.0000 mg | ORAL_TABLET | Freq: Every day | ORAL | Status: DC
  Administered 2021-04-01 – 2021-04-06 (×6): 1000 mg via ORAL
  Filled 2021-04-01 (×6): qty 2

## 2021-04-01 MED ORDER — PANTOPRAZOLE SODIUM 40 MG PO TBEC
40.0000 mg | DELAYED_RELEASE_TABLET | Freq: Every day | ORAL | Status: DC
  Administered 2021-04-01 – 2021-04-06 (×6): 40 mg via ORAL
  Filled 2021-04-01 (×6): qty 1

## 2021-04-01 MED ORDER — SENNOSIDES-DOCUSATE SODIUM 8.6-50 MG PO TABS
2.0000 | ORAL_TABLET | Freq: Two times a day (BID) | ORAL | Status: DC
  Administered 2021-04-01 – 2021-04-06 (×9): 2 via ORAL
  Filled 2021-04-01 (×10): qty 2

## 2021-04-01 MED ORDER — POLYETHYLENE GLYCOL 3350 17 G PO PACK
17.0000 g | PACK | Freq: Every day | ORAL | Status: DC
  Administered 2021-04-01 – 2021-04-04 (×4): 17 g via ORAL
  Filled 2021-04-01 (×6): qty 1

## 2021-04-01 MED ORDER — CLOPIDOGREL BISULFATE 75 MG PO TABS
75.0000 mg | ORAL_TABLET | Freq: Every day | ORAL | Status: DC
  Administered 2021-04-01 – 2021-04-06 (×6): 75 mg via ORAL
  Filled 2021-04-01 (×6): qty 1

## 2021-04-01 MED ORDER — ONDANSETRON HCL 4 MG PO TABS: 4.0000 mg | ORAL_TABLET | Freq: Four times a day (QID) | ORAL | Status: DC | PRN

## 2021-04-01 MED ORDER — TAMSULOSIN HCL 0.4 MG PO CAPS
0.4000 mg | ORAL_CAPSULE | Freq: Every day | ORAL | Status: DC
  Administered 2021-04-01 – 2021-04-06 (×6): 0.4 mg via ORAL
  Filled 2021-04-01 (×6): qty 1

## 2021-04-01 MED ORDER — LORAZEPAM 0.5 MG PO TABS: 0.5000 mg | ORAL_TABLET | Freq: Every evening | ORAL | Status: DC | PRN

## 2021-04-01 MED ORDER — FAMOTIDINE 20 MG PO TABS
20.0000 mg | ORAL_TABLET | Freq: Every day | ORAL | Status: DC | PRN
  Administered 2021-04-02: 20 mg via ORAL
  Filled 2021-04-01: qty 1

## 2021-04-01 MED ORDER — DULOXETINE HCL 60 MG PO CPEP
60.0000 mg | ORAL_CAPSULE | Freq: Every day | ORAL | Status: DC
  Administered 2021-04-01 – 2021-04-06 (×6): 60 mg via ORAL
  Filled 2021-04-01 (×6): qty 1

## 2021-04-01 NOTE — Progress Notes (Signed)
Hypoglycemic Event  CBG: 55  Treatment: 8 oz juice/soda  Symptoms: Sweaty  Follow-up CBG: Time:2205 CBG Result:87  Possible Reasons for Event: Unknown  Comments/MD notified:na     Jaynie Crumble

## 2021-04-01 NOTE — NC FL2 (Signed)
[place] LEVEL OF CARE SCREENING TOOL     IDENTIFICATION  Patient Name: James Spurr, MD Birthdate: Oct 19, 1930 Sex: male Admission Date (Current Location): 03/31/2021  [facility] and [place] Number:  Engineering geologist and Address:  [facility], [address] [place], [place] [address]      Provider Number: [id number]  Attending Physician Name and Address:  Hosie Poisson, MD  Relative Name and Phone Number:  Vonna Kotyk [phone]    Current Level of Care: Hospital Recommended Level of Care: [place] Prior Approval Number:    Date Approved/Denied:   PASRR Number: [id number]  Discharge Plan: SNF    Current Diagnoses: Patient Active Problem List   Diagnosis Date Noted   UTI (urinary tract infection) 03/31/2021   Polypharmacy 03/10/2021   Acute encephalopathy 11/02/2020   Dysuria 10/18/2020   Healthcare maintenance 06/01/2020   Involuntary movements 04/25/2020   Post-nasal drainage 07/16/2019   Recurrent productive cough 05/31/2019   Hx of basal cell carcinoma 07/10/2017   Rosacea    Gait disturbance, post-stroke 10/05/2015   Presbycusis of both ears    Advanced care planning/counseling discussion 06/23/2015   Impaired mobility 04/23/2015   Hearing loss of right ear due to cerumen impaction 03/23/2015   HTN (hypertension) 17/51/0258   Diastolic dysfunction 52/77/8242   Spastic hemiparesis affecting dominant side ([place]) 07/07/2014   Chest pressure 02/03/2014   Medicare annual wellness visit, subsequent 11/06/2013   Erectile dysfunction due to arterial insufficiency 02/05/2013   MDD (major depressive disorder), recurrent episode, moderate ([place])    Palpitations 06/26/2012   Hyperlipidemia associated with type 2 diabetes mellitus ([place])    Restless leg syndrome    GERD (gastroesophageal reflux disease)    BPH with obstruction/lower urinary tract symptoms    History of CVA (cerebrovascular  accident) 03/11/2012   Hemiparesis affecting right side as late effect of stroke ([place]) 03/11/2012   Type 2 diabetes mellitus, uncontrolled, with neuropathy ([place]) 03/11/2012   OSA on CPAP 03/11/2012   CKD stage 3 secondary to diabetes ([place]) 03/11/2012   Migraines 03/11/2012   Thrombocytopenia ([place]) 2011   Cause of injury, fall 2011    Orientation RESPIRATION BLADDER Height & Weight     Self, Time, Situation, Place  Normal External catheter Weight: 83.9 kg Height:  5\' 8"  (172.7 cm)  BEHAVIORAL SYMPTOMS/MOOD NEUROLOGICAL BOWEL NUTRITION STATUS      Continent Diet (regular)  AMBULATORY STATUS COMMUNICATION OF NEEDS Skin   Extensive Assist Verbally Normal                       Personal Care Assistance Level of Assistance  Bathing, Dressing Bathing Assistance: Limited assistance   Dressing Assistance: Limited assistance Total Care Assistance: Limited assistance   Functional Limitations Info             SPECIAL CARE FACTORS FREQUENCY  PT (By licensed PT), OT (By licensed OT)     PT Frequency: 5 times per week OT Frequency: 3 times per week            Contractures Contractures Info: Not present    Additional Factors Info  Code Status, Allergies Code Status Info: FUll code Allergies Info: Crestor Rosuvastatin, Dulaglutide, Empagliflozin, Gabapentin, Lisinopril, Nexium Esomeprazole, Niacin And Related, Oxycodone, Pregabalin           Current Medications (04/01/2021):  This is the current hospital active medication list Current Facility-Administered Medications  Medication Dose Route Frequency Provider Last  Rate Last Admin   0.9 %  sodium chloride infusion   Intravenous Continuous Mansy, Jan A, MD 100 mL/hr at 04/01/21 1022 Restarted at 04/01/21 1022   acetaminophen (TYLENOL) tablet 650 mg  650 mg Oral Q6H PRN Mansy, Jan A, MD       Or   acetaminophen (TYLENOL) suppository 650 mg  650 mg Rectal Q6H PRN Mansy, Jan A, MD       ascorbic acid (VITAMIN C) tablet  1,000 mg  1,000 mg Oral Daily Mansy, Jan A, MD   1,000 mg at 04/01/21 0940   bisacodyl (DULCOLAX) EC tablet 5 mg  5 mg Oral Daily PRN Mansy, Jan A, MD       cefTRIAXone (ROCEPHIN) 1 g in sodium chloride 0.9 % 100 mL IVPB  1 g Intravenous Q24H Mansy, Jan A, MD       celecoxib (CELEBREX) capsule 200 mg  200 mg Oral Daily PRN Mansy, Jan A, MD       clopidogrel (PLAVIX) tablet 75 mg  75 mg Oral Daily Mansy, Jan A, MD   75 mg at 04/01/21 0830   docusate sodium (COLACE) capsule 100 mg  100 mg Oral BID PRN Mansy, Jan A, MD       DULoxetine (CYMBALTA) DR capsule 60 mg  60 mg Oral Daily Mansy, Jan A, MD   60 mg at 04/01/21 0939   enoxaparin (LOVENOX) injection 40 mg  40 mg Subcutaneous Q24H Mansy, Jan A, MD   40 mg at 04/01/21 0800   famotidine (PEPCID) tablet 20 mg  20 mg Oral Daily PRN Mansy, Jan A, MD       gabapentin (NEURONTIN) capsule 600 mg  600 mg Oral QID Hosie Poisson, MD   600 mg at 04/01/21 0939   insulin glargine (LANTUS) injection 90 Units  90 Units Subcutaneous QHS Mansy, Jan A, MD   90 Units at 04/01/21 0210   ipratropium (ATROVENT) 0.03 % nasal spray 1 spray  1 spray Each Nare BID Mansy, Jan A, MD   1 spray at 04/01/21 0935   l-methylfolate-B6-B12 (METANX) 3-35-2 MG per tablet 1 tablet  1 tablet Oral Daily Mansy, Jan A, MD       levothyroxine (SYNTHROID) tablet 50 mcg  50 mcg Oral Q0600 Mansy, Jan A, MD   50 mcg at 04/01/21 0524   LORazepam (ATIVAN) tablet 0.5 mg  0.5 mg Oral QHS PRN Mansy, Jan A, MD       magnesium hydroxide (MILK OF MAGNESIA) suspension 30 mL  30 mL Oral Daily PRN Mansy, Jan A, MD       melatonin tablet 5 mg  5 mg Oral QHS Mansy, Jan A, MD       metoprolol tartrate (LOPRESSOR) tablet 25 mg  25 mg Oral QHS PRN Mansy, Jan A, MD       nitroGLYCERIN (NITROSTAT) SL tablet 0.4 mg  0.4 mg Sublingual Q5 min PRN Mansy, Jan A, MD       omega-3 acid ethyl esters (LOVAZA) capsule 1 g  1 g Oral BID Mansy, Jan A, MD   1 g at 04/01/21 0938   ondansetron (ZOFRAN) tablet 4 mg  4 mg Oral  Q6H PRN Mansy, Jan A, MD       Or   ondansetron [facility]) injection 4 mg  4 mg Intravenous Q6H PRN Mansy, Jan A, MD       ondansetron [facility]) tablet 4 mg  4 mg Oral Q8H PRN Mansy, Arvella Merles, MD  pantoprazole (PROTONIX) EC tablet 40 mg  40 mg Oral Daily Mansy, Jan A, MD   40 mg at 04/01/21 0939   polyethylene glycol (MIRALAX / GLYCOLAX) packet 17 g  17 g Oral Daily Hosie Poisson, MD   17 g at 04/01/21 0936   pramipexole (MIRAPEX) tablet 1 mg  1 mg Oral q1600 Mansy, Jan A, MD       rosuvastatin (CRESTOR) tablet 40 mg  40 mg Oral Daily Mansy, Jan A, MD   40 mg at 04/01/21 0938   [START ON 04/02/2021] Semaglutide (1 MG/DOSE) SOPN 1 mg  1 mg Subcutaneous Q Sat Mansy, Jan A, MD       senna-docusate (Senokot-S) tablet 2 tablet  2 tablet Oral BID Hosie Poisson, MD   2 tablet at 04/01/21 0938   sodium phosphate (FLEET) 7-19 GM/118ML enema 1 enema  1 enema Rectal Daily PRN Mansy, Jan A, MD       tamsulosin [facility]) capsule 0.4 mg  0.4 mg Oral QPC breakfast Mansy, Jan A, MD   0.4 mg at 04/01/21 0939   traZODone (DESYREL) tablet 25 mg  25 mg Oral QHS PRN Mansy, Jan A, MD   25 mg at 04/01/21 6269     Discharge Medications: Please see discharge summary for a list of discharge medications.  Relevant Imaging Results:  Relevant Lab Results:   Additional Information SSN: [id number]  Su Hilt, RN

## 2021-04-01 NOTE — Progress Notes (Signed)
PHARMACIST - PHYSICIAN ORDER COMMUNICATION  CONCERNING: P&T Medication Policy on Herbal Medications  DESCRIPTION:  This patient's order for:  Alpha-Lipoic Acid CAPS 600 mg and  COQ10 CAPS 1 capsule has been noted.  This product(s) is classified as an "herbal" or natural product. Due to a lack of definitive safety studies or FDA approval, nonstandard manufacturing practices, plus the potential risk of unknown drug-drug interactions while on inpatient medications, the Pharmacy and Therapeutics Committee does not permit the use of "herbal" or natural products of this type within [facility].   ACTION TAKEN: The pharmacy department is unable to verify this order at this time. Please reevaluate patient's clinical condition at discharge and address if the herbal or natural product(s) should be resumed at that time.  Renda Rolls, PharmD, MBA 04/01/2021 3:00 AM

## 2021-04-01 NOTE — Progress Notes (Signed)
PROGRESS NOTE    [name], MD  GYF:[id number] DOB: [birth year] DOA: 03/31/2021 PCP: [name], MD    Chief Complaint  Patient presents with   Urinary Frequency    Brief Narrative:  87 year old gentleman with prior history of migraine, hypertension, obstructive sleep apnea on CPAP, type 2 diabetes mellitus stage IIIa CKD, dyslipidemia, see history of CVA with right-sided hemiparesis, GERD, restless leg syndrome presents to ED with acute onset of generalized weakness and multiple falls.  On arrival to ED he was found to have a UTI.  Imaging of pelvis and hips did not show any acute abnormalities.  Chest x-ray is negative for acute cardiopulmonary disease.  He was admitted for evaluation and treatment of dehydration and urinary tract infection.    Assessment & Plan:   Active Problems:   History of CVA (cerebrovascular accident)   Type 2 diabetes mellitus, uncontrolled, with neuropathy (HCC)   OSA on CPAP   CKD stage 3 secondary to diabetes ([place])   Hyperlipidemia associated with type 2 diabetes mellitus (HCC)   Restless leg syndrome   GERD (gastroesophageal reflux disease)   Migraines   HTN (hypertension)   UTI (urinary tract infection)   Generalized weakness, multiple falls, urinary tract infection Patient was started on IV Rocephin and IV fluids. Continue to follow urine cultures. Therapy evaluations ordered for deconditioning and generalized weakness.   AKI superimposed on stage IIIa CKD Baseline creatinine around 1.3.  He was admitted with a creatinine of 1.5. Gently hydrated and repeat renal parameters are back to baseline.    Obstructive sleep apnea Continue with CPAP at night.   Type 2 diabetes mellitus Uncontrolled with neuropathy CBG (last 3)  Recent Labs    04/01/21 0216  GLUCAP 128*   Continue with sliding scale insulin and gabapentin for peripheral neuropathy. Hemoglobin A1c ordered.   Essential hypertension Blood pressure  parameters appear to be optimal continue with home medications.    Mild abdominal pain, no bowel movement for over 3 days now Bowel regimen ordered an abdominal films to evaluate for obstruction.   Mild thrombocytopenia No signs of bleeding at this time.   GERD Stable continue with PPI   DVT prophylaxis: (Lovenox) Code Status: (Full Code) Family Communication: none at bedside.  Disposition:   Status is: Inpatient  Remains inpatient appropriate because:Ongoing diagnostic testing needed not appropriate for outpatient work up, Unsafe d/c plan, and IV treatments appropriate due to intensity of illness or inability to take PO  Dispo: The patient is from: ALF              Anticipated d/c is to:  PENDING.               Patient currently is not medically stable to d/c.   Difficult to place patient No       Consultants:  None.   Procedures: none.   Antimicrobials:  Antibiotics Given (last 72 hours)     Date/Time Action Medication Dose Rate   03/31/21 2253 New Bag/Given   cefTRIAXone (ROCEPHIN) 1 g in sodium chloride 0.9 % 100 mL IVPB 1 g 200 mL/hr         Subjective: Pt reports some abdominal pain and discomfort, reports restless legs, and requesting gabapentin.    Objective: Vitals:   04/01/21 0004 04/01/21 0041 04/01/21 0444 04/01/21 0910  BP: 130/67 (!) 129/59 (!) 139/57 (!) 131/46  Pulse: 84 79 87 79  Resp: 19 17 19 19   Temp: 98.5 F (36.9 C) 98.9  F (37.2 C) 98.9 F (37.2 C) 98.7 F (37.1 C)  TempSrc: Oral Oral    SpO2: 100% 100% 100% 100%  Weight:      Height:        Intake/Output Summary (Last 24 hours) at 04/01/2021 1245 Last data filed at 04/01/2021 0448 Gross per 24 hour  Intake 1193.75 ml  Output 450 ml  Net 743.75 ml   Filed Weights   03/31/21 1849  Weight: 83.9 kg    Examination:  General exam: Appears calm and comfortable  Respiratory system: Clear to auscultation. Respiratory effort normal. Cardiovascular system: S1 & S2  heard, RRR. No JVD, No pedal edema. Gastrointestinal system: Abdomen is soft, mildy tender in the lower quadrant,  Normal bowel sounds heard. Central nervous system: Alert and oriented. No focal neurological deficits. Extremities: no pedal edema.  Skin: No rashes, lesions or ulcers Psychiatry: Mood & affect appropriate.     Data Reviewed: I have personally reviewed following labs and imaging studies  CBC: Recent Labs  Lab 03/31/21 0348 03/31/21 2238 04/01/21 0500  WBC 15.4* 14.6* 12.4*  NEUTROABS 12.9* 11.6*  --   HGB 14.4 14.4 13.3  HCT 43.6 44.1 38.8*  MCV 90.8 91.1 88.8  PLT 162 148* 139*    Basic Metabolic Panel: Recent Labs  Lab 03/31/21 0348 03/31/21 2238 04/01/21 0500  NA 135 137 139  K 4.1 3.7 3.6  CL 103 101 108  CO2 25 27 27   GLUCOSE 220* 161* 74  BUN 28* 25* 23  CREATININE 1.52* 1.53* 1.37*  CALCIUM 8.8* 8.7* 8.3*    GFR: Estimated Creatinine Clearance: 40.1 mL/min (A) (by C-G formula based on SCr of 1.37 mg/dL (H)).  Liver Function Tests: No results for input(s): AST, ALT, ALKPHOS, BILITOT, PROT, ALBUMIN in the last 168 hours.  CBG: Recent Labs  Lab 04/01/21 0216  GLUCAP 128*     Recent Results (from the past 240 hour(s))  Resp Panel by RT-PCR (Flu A&B, Covid) Nasopharyngeal Swab     Status: None   Collection Time: 03/31/21  3:48 AM   Specimen: Nasopharyngeal Swab; Nasopharyngeal(NP) swabs in vial transport medium  Result Value Ref Range Status   SARS Coronavirus 2 by RT PCR NEGATIVE NEGATIVE Final    Comment: (NOTE) SARS-CoV-2 target nucleic acids are NOT DETECTED.  The SARS-CoV-2 RNA is generally detectable in upper respiratory specimens during the acute phase of infection. The lowest concentration of SARS-CoV-2 viral copies this assay can detect is 138 copies/mL. A negative result does not preclude SARS-Cov-2 infection and should not be used as the sole basis for treatment or other patient management decisions. A negative result may  occur with  improper specimen collection/handling, submission of specimen other than nasopharyngeal swab, presence of viral mutation(s) within the areas targeted by this assay, and inadequate number of viral copies(<138 copies/mL). A negative result must be combined with clinical observations, patient history, and epidemiological information. The expected result is Negative.  Fact Sheet for Patients:  EntrepreneurPulse.com.au  Fact Sheet for Healthcare Providers:  IncredibleEmployment.be  This test is no t yet approved or cleared by the [place] FDA and  has been authorized for detection and/or diagnosis of SARS-CoV-2 by FDA under an Emergency Use Authorization (EUA). This EUA will remain  in effect (meaning this test can be used) for the duration of the COVID-19 declaration under Section 564(b)(1) of the Act, 21 U.S.C.section 360bbb-3(b)(1), unless the authorization is terminated  or revoked sooner.       Influenza A  by PCR NEGATIVE NEGATIVE Final   Influenza B by PCR NEGATIVE NEGATIVE Final    Comment: (NOTE) The Xpert Xpress SARS-CoV-2/FLU/RSV plus assay is intended as an aid in the diagnosis of influenza from Nasopharyngeal swab specimens and should not be used as a sole basis for treatment. Nasal washings and aspirates are unacceptable for Xpert Xpress SARS-CoV-2/FLU/RSV testing.  Fact Sheet for Patients: EntrepreneurPulse.com.au  Fact Sheet for Healthcare Providers: IncredibleEmployment.be  This test is not yet approved or cleared by the [place] FDA and has been authorized for detection and/or diagnosis of SARS-CoV-2 by FDA under an Emergency Use Authorization (EUA). This EUA will remain in effect (meaning this test can be used) for the duration of the COVID-19 declaration under Section 564(b)(1) of the Act, 21 U.S.C. section 360bbb-3(b)(1), unless the authorization is terminated  or revoked.  Performed at [facility], [place], [place] [address]   SARS CORONAVIRUS 2 (TAT 6-24 HRS) Nasopharyngeal Nasopharyngeal Swab     Status: None   Collection Time: 03/31/21 10:38 PM   Specimen: Nasopharyngeal Swab  Result Value Ref Range Status   SARS Coronavirus 2 NEGATIVE NEGATIVE Final    Comment: (NOTE) SARS-CoV-2 target nucleic acids are NOT DETECTED.  The SARS-CoV-2 RNA is generally detectable in upper and lower respiratory specimens during the acute phase of infection. Negative results do not preclude SARS-CoV-2 infection, do not rule out co-infections with other pathogens, and should not be used as the sole basis for treatment or other patient management decisions. Negative results must be combined with clinical observations, patient history, and epidemiological information. The expected result is Negative.  Fact Sheet for Patients: SugarRoll.be  Fact Sheet for Healthcare Providers: https://www.woods-mathews.com/  This test is not yet approved or cleared by the [place] FDA and  has been authorized for detection and/or diagnosis of SARS-CoV-2 by FDA under an Emergency Use Authorization (EUA). This EUA will remain  in effect (meaning this test can be used) for the duration of the COVID-19 declaration under Se ction 564(b)(1) of the Act, 21 U.S.C. section 360bbb-3(b)(1), unless the authorization is terminated or revoked sooner.  Performed at [facility], [place] [address], [place], [place] [address]          Radiology Studies: DG Chest 1 View  Result Date: 03/31/2021 CLINICAL DATA:  87 year old male status post falls. Pain. EXAM: CHEST  1 VIEW COMPARISON:  Portable chest 11/02/2020 and earlier. FINDINGS: Supine AP views at 0426 hours. Lower lung volumes. Mediastinal contours are stable and within normal limits. Visualized tracheal air column is within normal limits. Allowing for  portable technique the lungs are clear. Mild apical pleural thickening is stable. No pneumothorax or pleural effusion evident on these supine views. Stable visualized osseous structures. IMPRESSION: Lower lung volumes. No acute cardiopulmonary abnormality or acute traumatic injury identified. Electronically Signed   By: Genevie Ann M.D.   On: 03/31/2021 05:30   DG Shoulder Right  Result Date: 03/31/2021 CLINICAL DATA:  87 year old male status post falls. Pain. EXAM: RIGHT SHOULDER - 2+ VIEW COMPARISON:  Chest radiographs 03/18/2015. FINDINGS: Bone mineralization is within normal limits for age. There is no evidence of fracture or dislocation. Normal glenohumeral alignment. Proximal right humerus appears intact. Mild for age degenerative spurring at the right [facility] joint. Visible right ribs appear intact. IMPRESSION: No acute fracture or dislocation identified about the right shoulder. Electronically Signed   By: Genevie Ann M.D.   On: 03/31/2021 05:29   DG Elbow Complete Right  Result Date: 03/31/2021 CLINICAL DATA:  87 year old male status post falls. Pain. EXAM: RIGHT ELBOW - COMPLETE 3+ VIEW COMPARISON:  None. FINDINGS: Lateral view is slightly oblique. Bone mineralization is within normal limits for age. There is no evidence of fracture, dislocation, or joint effusion. No significant degenerative changes. No discrete soft tissue injury. IMPRESSION: No acute fracture or dislocation identified about the right elbow. Electronically Signed   By: Genevie Ann M.D.   On: 03/31/2021 05:27   CT Head Wo Contrast  Result Date: 03/31/2021 CLINICAL DATA:  87 year old male status post falls, collapse. EXAM: CT HEAD WITHOUT CONTRAST TECHNIQUE: Contiguous axial images were obtained from the base of the skull through the vertex without intravenous contrast. COMPARISON:  Brain MRI 11/02/2020.  Head CT 03/22/2021. FINDINGS: Brain: Advanced chronic small vessel disease in the left corona radiata and lentiform appears stable. Stable  gray-white matter differentiation elsewhere. No midline shift, ventriculomegaly, mass effect, evidence of mass lesion, intracranial hemorrhage or evidence of cortically based acute infarction. Vascular: Calcified atherosclerosis at the skull base. No suspicious intracranial vascular hyperdensity. Skull: Stable, intact. Small imbedded metallic foreign body at the vertex is stable (series 2, image 75). Sinuses/Orbits: Visualized paranasal sinuses and mastoids are stable and well aerated. Other: No orbit or scalp soft tissue injury identified. IMPRESSION: 1. No acute intracranial abnormality or acute traumatic injury identified. 2. Chronic small vessel disease in the left corona radiata and lentiform. Electronically Signed   By: Genevie Ann M.D.   On: 03/31/2021 04:56   CT Cervical Spine Wo Contrast  Result Date: 03/31/2021 CLINICAL DATA:  87 year old male status post falls, collapse. EXAM: CT CERVICAL SPINE WITHOUT CONTRAST TECHNIQUE: Multidetector CT imaging of the cervical spine was performed without intravenous contrast. Multiplanar CT image reconstructions were also generated. COMPARISON:  Cervical spine CT 07/01/2020. Head CT today reported separately. FINDINGS: Alignment: Stable, mildly exaggerated cervical lordosis. Cervicothoracic junction alignment is within normal limits. Posterior element alignment is stable. Skull base and vertebrae: Visualized skull base is intact. No atlanto-occipital dissociation. Stable bone mineralization. C1 and C2 appear stable and intact. No acute osseous abnormality identified. Chronic interbody ankylosis C6 through T1 from flowing anterior endplate osteophytes. Soft tissues and spinal canal: No prevertebral fluid or swelling. No visible canal hematoma. Negative noncontrast neck soft tissues. Disc levels: Stable cervical spine degeneration. Fairly capacious cervical spinal canal. Upper chest: Stable visible upper chest. IMPRESSION: 1. No acute traumatic injury identified in the  cervical spine. 2. Chronic cervical spine degeneration. Electronically Signed   By: Genevie Ann M.D.   On: 03/31/2021 04:59   DG Knee Complete 4 Views Right  Result Date: 03/31/2021 CLINICAL DATA:  87 year old male status post falls. Pain. EXAM: RIGHT KNEE - COMPLETE 4+ VIEW COMPARISON:  None. FINDINGS: Bone mineralization is within normal limits for age. No evidence of fracture, dislocation, or joint effusion. No significant degenerative changes. No discrete soft tissue injury. IMPRESSION: No acute fracture or dislocation identified about the right knee. Electronically Signed   By: Genevie Ann M.D.   On: 03/31/2021 05:32   DG Hip Unilat W or Wo Pelvis 2-3 Views Right  Result Date: 03/31/2021 CLINICAL DATA:  87 year old male status post falls.  Pain. EXAM: DG HIP (WITH OR WITHOUT PELVIS) 2-3V RIGHT COMPARISON:  CT Abdomen and Pelvis 07/01/2020. FINDINGS: Bone mineralization is within normal limits for age. There is no evidence of hip fracture or dislocation. Pelvis appears intact. Grossly intact proximal left femur. Incidental scrotal surgical clips. IMPRESSION: No acute fracture or dislocation identified  about the right hip or pelvis. Electronically Signed   By: Genevie Ann M.D.   On: 03/31/2021 05:26        Scheduled Meds:  vitamin C  1,000 mg Oral Daily   clopidogrel  75 mg Oral Daily   DULoxetine  60 mg Oral Daily   enoxaparin (LOVENOX) injection  40 mg Subcutaneous Q24H   gabapentin  600 mg Oral QID   insulin glargine  90 Units Subcutaneous QHS   ipratropium  1 spray Each Nare BID   l-methylfolate-B6-B12  1 tablet Oral Daily   levothyroxine  50 mcg Oral Q0600   melatonin  5 mg Oral QHS   omega-3 acid ethyl esters  1 g Oral BID   pantoprazole  40 mg Oral Daily   polyethylene glycol  17 g Oral Daily   pramipexole  1 mg Oral q1600   rosuvastatin  40 mg Oral Daily   [START ON 04/02/2021] Semaglutide (1 MG/DOSE)  1 mg Subcutaneous Q Sat   senna-docusate  2 tablet Oral BID   tamsulosin  0.4 mg Oral  QPC breakfast   Continuous Infusions:  sodium chloride 100 mL/hr at 04/01/21 1022   cefTRIAXone (ROCEPHIN)  IV       LOS: 1 day        Hosie Poisson, MD [facility]   To contact the attending provider between 7A-7P or the covering provider during after hours 7P-7A, please log into the web site [email or web address] and access using universal [place] password for that web site. If you do not have the password, please call the hospital operator.  04/01/2021, 12:45 PM

## 2021-04-01 NOTE — Plan of Care (Signed)
New care plan initiated 

## 2021-04-01 NOTE — Evaluation (Addendum)
Physical Therapy Evaluation Patient Details Name: James ERBES, MD MRN: [id number] DOB: Feb 13, 1931 Today's Date: 04/01/2021   History of Present Illness  Per MD notes, pt is an 87 year old gentleman with prior history of migraine, hypertension, obstructive sleep apnea on CPAP, type 2 diabetes mellitus stage IIIa CKD, dyslipidemia, history of CVA with right-sided hemiparesis, GERD, restless leg syndrome presented to ED with acute onset of generalized weakness and multiple falls. MD assessment includes generalized weakness, multiple falls, urinary tract infection, and AKI superimposed on stage IIIa CKD.  Clinical Impression  Pt was pleasant and motivated to participate during the session. Pt gave good effort during session. He reported 7/10 abdominal pain with activity but 4/10 at rest. Min A needed for bed mobility and min A to min A +2 required for OOB activities for increased safety. Pt demonstrates unsteadiness walking having difficulty advancing RLE ,slow short steps, and min assist +2 for maintaining upright posture.  Pt is limited secondary to generalized weakness and pain. He would like to return to [facility] saying he should be able to get assistance with transfers and OOB activities but was agreeable to have SNF as a recommendation in case he was unable to receive those services (Pt would currently require assistance with all functional mobility for safety). Pt will benefit from PT services in a SNF setting upon discharge to safely address deficits listed in patient problem list for decreased caregiver assistance and eventual return to PLOF.     Follow Up Recommendations SNF;Supervision for mobility/OOB    Equipment Recommendations  None recommended by PT    Recommendations for Other Services       Precautions / Restrictions Precautions Precautions: Fall Restrictions Weight Bearing Restrictions: No      Mobility  Bed Mobility Overal bed mobility: Needs Assistance Bed  Mobility: Supine to Sit     Supine to sit: Min assist     General bed mobility comments: Assist with trunk control    Transfers Overall transfer level: Needs assistance Equipment used: Rolling walker (2 wheeled) Transfers: Sit to/from Stand Sit to Stand: Min assist         General transfer comment: Assist to initiate standing from elevated surface, poor eccentric control  Ambulation/Gait Ambulation/Gait assistance: Min assist;+2 safety/equipment Gait Distance (Feet): 5 Feet Assistive device: Rolling walker (2 wheeled) Gait Pattern/deviations: Step-to pattern;Decreased step length - right;Decreased step length - left;Decreased stride length;Trunk flexed Gait velocity: decreased   General Gait Details: Demonstrated difficulty advancing R LE. Min assist +2 for increased saftey and maintaining upright posture. Pt walked forward/backwards with verbal cueing for sequencing.   Stairs            Wheelchair Mobility    Modified Rankin (Stroke Patients Only)       Balance Overall balance assessment: Needs assistance Sitting-balance support: Bilateral upper extremity supported;Feet supported Sitting balance-Leahy Scale: Fair Sitting balance - Comments: Intermittent assistance to prevent loss of balance primarily to right side. Postural control: Right lateral lean Standing balance support: Bilateral upper extremity supported Standing balance-Leahy Scale: Poor Standing balance comment: Min assist +2 required and heavy reliance on UEs through RW                             Pertinent Vitals/Pain Pain Assessment: 0-10 Pain Score: 7  (7/10 with activity and 4/10 at rest) Pain Location: abdomen Pain Descriptors / Indicators: Aching;Sore;Discomfort Pain Intervention(s): Limited activity within patient's tolerance;Monitored during session;Repositioned  Home Living Family/patient expects to be discharged to:: Private residence Living Arrangements:  Alone Available Help at Discharge: Family;Available PRN/intermittently;Personal care attendant Type of Home: Independent living facility [facility]) Home Access: Level entry     Home Layout: One level Home Equipment: [place] - 4 wheels;Walker - 2 wheels;Cane - quad;Grab bars - tub/shower;Shower seat;Wheelchair - power      Prior Function Level of Independence: Needs assistance   Gait / Transfers Assistance Needed: Pt reports primarily using power w/c but uses rollator for short distances to stay strong.  ADL's / Homemaking Assistance Needed: PCA 3x week for iADLs (meal prep and housekeeping).  Comments: Pt reports 3 falls within the last week secondary to knees buckling on him but none prior to this.     Hand Dominance   Dominant Hand: Left    Extremity/Trunk Assessment   Upper Extremity Assessment Upper Extremity Assessment: RUE deficits/detail;LUE deficits/detail RUE Deficits / Details: Grossly at 4-/5 LUE Deficits / Details: Grossly at 5/5    Lower Extremity Assessment Lower Extremity Assessment: RLE deficits/detail;LLE deficits/detail RLE Deficits / Details: Grossly at 4-/5 LLE Deficits / Details: Grossly at 5/5    Cervical / Trunk Assessment Cervical / Trunk Assessment: Kyphotic  Communication   Communication: No difficulties  Cognition Arousal/Alertness: Awake/alert Behavior During Therapy: WFL for tasks assessed/performed Overall Cognitive Status: Within Functional Limits for tasks assessed                                        General Comments General comments (skin integrity, edema, etc.): Bruising note around right scapular and reported tenderness to touch. Bruising and scabs at R knee.     Exercises Total Joint Exercises Ankle Circles/Pumps: AROM;Both;10 reps;Supine Straight Leg Raises: AROM;Both;10 reps;Supine Other Exercises Other Exercises: Static sitting 3-35min with intermittent min assist for improved trunk control and  balance. Other Exercises: Gait education with RW to reduce weight bearing on RLE as needed in case of buckling was provided via demonstration and verbally.   Assessment/Plan    PT Assessment Patient needs continued PT services  PT Problem List Decreased strength;Decreased activity tolerance;Decreased balance;Decreased mobility;Decreased knowledge of use of DME;Decreased safety awareness;Pain       PT Treatment Interventions DME instruction;Gait training;Functional mobility training;Therapeutic activities;Therapeutic exercise;Balance training;Patient/family education    PT Goals (Current goals can be found in the Care Plan section)  Acute Rehab PT Goals Patient Stated Goal: Go to bathroom by myself PT Goal Formulation: With patient Time For Goal Achievement: 04/14/21 Potential to Achieve Goals: Fair    Frequency Min 2X/week   Barriers to discharge Decreased caregiver support      Co-evaluation               AM-PAC PT "6 Clicks" Mobility  Outcome Measure Help needed turning from your back to your side while in a flat bed without using bedrails?: A Little Help needed moving from lying on your back to sitting on the side of a flat bed without using bedrails?: A Little Help needed moving to and from a bed to a chair (including a wheelchair)?: A Lot Help needed standing up from a chair using your arms (e.g., wheelchair or bedside chair)?: A Little Help needed to walk in hospital room?: A Lot Help needed climbing 3-5 steps with a railing? : Total 6 Click Score: 14    End of Session Equipment Utilized During Treatment: Gait belt Activity  Tolerance: Patient tolerated treatment well Patient left: in chair;with call bell/phone within reach;with chair alarm set Nurse Communication: Mobility status (notified of need for hot compress) PT Visit Diagnosis: Unsteadiness on feet (R26.81);Difficulty in walking, not elsewhere classified (R26.2);Muscle weakness (generalized)  (M62.81);History of falling (Z91.81);Pain Pain - Right/Left:  (abdomen) Pain - part of body:  (abdomen)    Time: 0092-3300 PT Time Calculation (min) (ACUTE ONLY): 45 min   Charges:              Dayton Scrape SPT 04/01/21, 1:46 PM   During this session, this physical therapist was present most of the session (all of exercises and functional mobility), participating in and directing the evaluation/treatment as needed.   This note has been reviewed and this clinician agrees with the information provided.   Leitha Bleak, PT 04/01/21, 3:16 PM

## 2021-04-01 NOTE — TOC Progression Note (Addendum)
Transition of Care [facility]) - Progression Note    Patient Details  Name: James ENOCHS, MD MRN: [id number] Date of Birth: Aug 24, 1931  Transition of Care [facility]) CM/SW [place], RN Phone Number: 04/01/2021, 3:42 PM  Clinical Narrative:     Met with the patient to discuss DC plan, He is agreeable to a bedsearch for [facility], PASSR obtained, FL2 completed, Bedsearch sent, will review bed choices once obtained, the patient is from [facility]        Expected Discharge Plan and Services                                                 Social Determinants of Health (SDOH) Interventions    Readmission Risk Interventions No flowsheet data found.

## 2021-04-01 NOTE — TOC Progression Note (Signed)
Transition of Care [facility]) - Progression Note    Patient Details  Name: James GUAMAN, MD MRN: [id number] Date of Birth: 1930-11-06  Transition of Care [facility]) CM/SW [place], RN Phone Number: 04/01/2021, 9:32 AM  Clinical Narrative:     Patient lives at [facility], His daughter is Lanette [place] [phone], He walks with a walker at baseline, Has not walked in last 24 hours due to weakness, TOC CM to monitor for needs       Expected Discharge Plan and Services                                                 Social Determinants of Health (SDOH) Interventions    Readmission Risk Interventions No flowsheet data found.

## 2021-04-02 ENCOUNTER — Inpatient Hospital Stay: Payer: Medicare Other

## 2021-04-02 LAB — BASIC METABOLIC PANEL
Anion gap: 6 (ref 5–15)
BUN: 23 mg/dL (ref 8–23)
CO2: 24 mmol/L (ref 22–32)
Calcium: 7.9 mg/dL — ABNORMAL LOW (ref 8.9–10.3)
Chloride: 106 mmol/L (ref 98–111)
Creatinine, Ser: 1.31 mg/dL — ABNORMAL HIGH (ref 0.61–1.24)
GFR, Estimated: 53 mL/min — ABNORMAL LOW (ref >60)
Glucose, Bld: 134 mg/dL — ABNORMAL HIGH (ref 70–99)
Potassium: 3.6 mmol/L (ref 3.5–5.1)
Sodium: 136 mmol/L (ref 135–145)

## 2021-04-02 LAB — GLUCOSE, CAPILLARY
Glucose-Capillary: 70 mg/dL (ref 70–99)
Glucose-Capillary: 126 mg/dL — ABNORMAL HIGH (ref 70–99)
Glucose-Capillary: 140 mg/dL — ABNORMAL HIGH (ref 70–99)
Glucose-Capillary: 162 mg/dL — ABNORMAL HIGH (ref 70–99)

## 2021-04-02 LAB — URINE CULTURE: Culture: <10000 — AB

## 2021-04-02 LAB — HEMOGLOBIN A1C
Hgb A1c MFr Bld: 8.5 % — ABNORMAL HIGH (ref 4.8–5.6)
Mean Plasma Glucose: 197.25 mg/dL

## 2021-04-02 MED ORDER — INSULIN ASPART 100 UNIT/ML IJ SOLN
0.0000 [IU] | Freq: Three times a day (TID) | INTRAMUSCULAR | Status: DC
  Administered 2021-04-02 (×2): 1 [IU] via SUBCUTANEOUS
  Administered 2021-04-03: 3 [IU] via SUBCUTANEOUS
  Administered 2021-04-04: 1 [IU] via SUBCUTANEOUS
  Administered 2021-04-04 – 2021-04-05 (×2): 2 [IU] via SUBCUTANEOUS
  Administered 2021-04-05: 3 [IU] via SUBCUTANEOUS
  Administered 2021-04-06: 2 [IU] via SUBCUTANEOUS
  Filled 2021-04-02 (×7): qty 1

## 2021-04-02 MED ORDER — INSULIN ASPART 100 UNIT/ML IJ SOLN
0.0000 [IU] | Freq: Every day | INTRAMUSCULAR | Status: DC
  Administered 2021-04-05: 2 [IU] via SUBCUTANEOUS
  Filled 2021-04-02: qty 1

## 2021-04-02 MED ORDER — INSULIN GLARGINE 100 UNIT/ML ~~LOC~~ SOLN
80.0000 [IU] | Freq: Every day | SUBCUTANEOUS | Status: DC
  Administered 2021-04-02: 80 [IU] via SUBCUTANEOUS
  Filled 2021-04-02 (×2): qty 0.8

## 2021-04-02 MED ORDER — CEPHALEXIN 500 MG PO CAPS
500.0000 mg | ORAL_CAPSULE | Freq: Two times a day (BID) | ORAL | Status: DC
  Administered 2021-04-02 – 2021-04-06 (×8): 500 mg via ORAL
  Filled 2021-04-02 (×8): qty 1

## 2021-04-02 MED ORDER — IBUPROFEN 400 MG PO TABS
200.0000 mg | ORAL_TABLET | Freq: Once | ORAL | Status: AC
  Administered 2021-04-02: 200 mg via ORAL
  Filled 2021-04-02: qty 1

## 2021-04-02 NOTE — Progress Notes (Signed)
PROGRESS NOTE    [name], MD  TWS:[id number] DOB: 11-04-1930 DOA: 03/31/2021 PCP: [name], MD    Chief Complaint  Patient presents with   Urinary Frequency    Brief Narrative:  87 year old gentleman with prior history of migraine, hypertension, obstructive sleep apnea on CPAP, type 2 diabetes mellitus stage IIIa CKD, dyslipidemia, see history of CVA with right-sided hemiparesis, GERD, restless leg syndrome presents to ED with acute onset of generalized weakness and multiple falls.  On arrival to ED he was found to have a UTI.  Imaging of pelvis and hips did not show any acute abnormalities.  Chest x-ray is negative for acute cardiopulmonary disease.  He was admitted for evaluation and treatment of dehydration and urinary tract infection.    Assessment & Plan:   Active Problems:   History of CVA (cerebrovascular accident)   Type 2 diabetes mellitus, uncontrolled, with neuropathy (HCC)   OSA on CPAP   CKD stage 3 secondary to diabetes ([place])   Hyperlipidemia associated with type 2 diabetes mellitus (HCC)   Restless leg syndrome   GERD (gastroesophageal reflux disease)   Migraines   HTN (hypertension)   UTI (urinary tract infection)   Generalized weakness, multiple falls, urinary tract infection Patient was started on IV Rocephin and IV fluids. Urine culture show insignificant growth, complete 3 days of IV Rocephin and discontinue the antibiotics.  Therapy evaluations ordered for deconditioning and generalized weakness recommending SNF.    AKI superimposed on stage IIIa CKD Baseline creatinine around 1.3.  He was admitted with a creatinine of 1.5. Gently hydrated and repeat renal parameters are back to baseline.    Obstructive sleep apnea Continue with CPAP at night.   Type 2 diabetes mellitus Uncontrolled with neuropathy CBG (last 3)  Recent Labs    04/01/21 0216 04/01/21 2118 04/01/21 2205  GLUCAP 128* 55* 87   Decrease the dose of lantus  from 90 units to 80 units . Continue with sliding scale insulin and gabapentin for peripheral neuropathy. Hemoglobin A1c ordered.   Essential hypertension Blood pressure parameters well controlled.     Mild abdominal pain, no bowel movement for over 3 days now Bowel regimen ordered an abdominal films to evaluate for obstruction. Abd x rays are negative for obstruction or constipation.  CT abd and pelvis without contrast to evaluate for left lower quadrant pain.  Pain control.  Pt had BM last night.    Mild thrombocytopenia No signs of bleeding at this time.   GERD Stable continue with PPI   DVT prophylaxis: (Lovenox) Code Status: (Full Code) Family Communication: none at bedside.  Disposition:   Status is: Inpatient  Remains inpatient appropriate because:Ongoing diagnostic testing needed not appropriate for outpatient work up, Unsafe d/c plan, and IV treatments appropriate due to intensity of illness or inability to take PO  Dispo: The patient is from: ALF              Anticipated d/c is to:  PENDING.               Patient currently is not medically stable to d/c.   Difficult to place patient No       Consultants:  None.   Procedures: none.   Antimicrobials:  Antibiotics Given (last 72 hours)     Date/Time Action Medication Dose Rate   03/31/21 2253 New Bag/Given   cefTRIAXone (ROCEPHIN) 1 g in sodium chloride 0.9 % 100 mL IVPB 1 g 200 mL/hr   04/01/21 2358 New  Bag/Given   cefTRIAXone (ROCEPHIN) 1 g in sodium chloride 0.9 % 100 mL IVPB 1 g 200 mL/hr         Subjective: Generalized body aches and mild lower quadrant abdominal pain.    Objective: Vitals:   04/01/21 2113 04/01/21 2209 04/02/21 0344 04/02/21 0815  BP: (!) 128/55  (!) 121/58 119/63  Pulse: 80 72 70 73  Resp: 19 16 17 20   Temp: 97.6 F (36.4 C)  98.2 F (36.8 C) 97.9 F (36.6 C)  TempSrc:    Oral  SpO2: 94% 97% 97% 100%  Weight:      Height:        Intake/Output Summary  (Last 24 hours) at 04/02/2021 0953 Last data filed at 04/02/2021 0400 Gross per 24 hour  Intake 1662.99 ml  Output 1500 ml  Net 162.99 ml    Filed Weights   03/31/21 1849  Weight: 83.9 kg    Examination:  General exam: elderly gentleman, not in distress.  Respiratory system: air entry fair, no wheezing heard.  Cardiovascular system: S1 & S2 heard, RRR, no JVD, no pedal edema.  Gastrointestinal system: Abdomen is soft, mild gen tenderness, bowels sounds wnl.  Central nervous system: alert and oriented, non focal.  Extremities: no pedal edema.  Skin: no rashes seen.  Psychiatry: Mood is appropriate. .     Data Reviewed: I have personally reviewed following labs and imaging studies  CBC: Recent Labs  Lab 03/31/21 0348 03/31/21 2238 04/01/21 0500  WBC 15.4* 14.6* 12.4*  NEUTROABS 12.9* 11.6*  --   HGB 14.4 14.4 13.3  HCT 43.6 44.1 38.8*  MCV 90.8 91.1 88.8  PLT 162 148* 139*     Basic Metabolic Panel: Recent Labs  Lab 03/31/21 0348 03/31/21 2238 04/01/21 0500  NA 135 137 139  K 4.1 3.7 3.6  CL 103 101 108  CO2 25 27 27   GLUCOSE 220* 161* 74  BUN 28* 25* 23  CREATININE 1.52* 1.53* 1.37*  CALCIUM 8.8* 8.7* 8.3*     GFR: Estimated Creatinine Clearance: 40.1 mL/min (A) (by C-G formula based on SCr of 1.37 mg/dL (H)).  Liver Function Tests: No results for input(s): AST, ALT, ALKPHOS, BILITOT, PROT, ALBUMIN in the last 168 hours.  CBG: Recent Labs  Lab 04/01/21 0216 04/01/21 2118 04/01/21 2205  GLUCAP 128* 55* 87      Recent Results (from the past 240 hour(s))  Resp Panel by RT-PCR (Flu A&B, Covid) Nasopharyngeal Swab     Status: None   Collection Time: 03/31/21  3:48 AM   Specimen: Nasopharyngeal Swab; Nasopharyngeal(NP) swabs in vial transport medium  Result Value Ref Range Status   SARS Coronavirus 2 by RT PCR NEGATIVE NEGATIVE Final    Comment: (NOTE) SARS-CoV-2 target nucleic acids are NOT DETECTED.  The SARS-CoV-2 RNA is generally  detectable in upper respiratory specimens during the acute phase of infection. The lowest concentration of SARS-CoV-2 viral copies this assay can detect is 138 copies/mL. A negative result does not preclude SARS-Cov-2 infection and should not be used as the sole basis for treatment or other patient management decisions. A negative result may occur with  improper specimen collection/handling, submission of specimen other than nasopharyngeal swab, presence of viral mutation(s) within the areas targeted by this assay, and inadequate number of viral copies(<138 copies/mL). A negative result must be combined with clinical observations, patient history, and epidemiological information. The expected result is Negative.  Fact Sheet for Patients:  EntrepreneurPulse.com.au  Fact Sheet for Healthcare  Providers:  IncredibleEmployment.be  This test is no t yet approved or cleared by the [place] and  has been authorized for detection and/or diagnosis of SARS-CoV-2 by FDA under an Emergency Use Authorization (EUA). This EUA will remain  in effect (meaning this test can be used) for the duration of the COVID-19 declaration under Section 564(b)(1) of the Act, 21 U.S.C.section 360bbb-3(b)(1), unless the authorization is terminated  or revoked sooner.       Influenza A by PCR NEGATIVE NEGATIVE Final   Influenza B by PCR NEGATIVE NEGATIVE Final    Comment: (NOTE) The Xpert Xpress SARS-CoV-2/FLU/RSV plus assay is intended as an aid in the diagnosis of influenza from Nasopharyngeal swab specimens and should not be used as a sole basis for treatment. Nasal washings and aspirates are unacceptable for Xpert Xpress SARS-CoV-2/FLU/RSV testing.  Fact Sheet for Patients: EntrepreneurPulse.com.au  Fact Sheet for Healthcare Providers: IncredibleEmployment.be  This test is not yet approved or cleared by the [place] FDA  and has been authorized for detection and/or diagnosis of SARS-CoV-2 by FDA under an Emergency Use Authorization (EUA). This EUA will remain in effect (meaning this test can be used) for the duration of the COVID-19 declaration under Section 564(b)(1) of the Act, 21 U.S.C. section 360bbb-3(b)(1), unless the authorization is terminated or revoked.  Performed at [facility], [place], [place] [address]   SARS CORONAVIRUS 2 (TAT 6-24 HRS) Nasopharyngeal Nasopharyngeal Swab     Status: None   Collection Time: 03/31/21 10:38 PM   Specimen: Nasopharyngeal Swab  Result Value Ref Range Status   SARS Coronavirus 2 NEGATIVE NEGATIVE Final    Comment: (NOTE) SARS-CoV-2 target nucleic acids are NOT DETECTED.  The SARS-CoV-2 RNA is generally detectable in upper and lower respiratory specimens during the acute phase of infection. Negative results do not preclude SARS-CoV-2 infection, do not rule out co-infections with other pathogens, and should not be used as the sole basis for treatment or other patient management decisions. Negative results must be combined with clinical observations, patient history, and epidemiological information. The expected result is Negative.  Fact Sheet for Patients: SugarRoll.be  Fact Sheet for Healthcare Providers: https://www.woods-mathews.com/  This test is not yet approved or cleared by the [place] FDA and  has been authorized for detection and/or diagnosis of SARS-CoV-2 by FDA under an Emergency Use Authorization (EUA). This EUA will remain  in effect (meaning this test can be used) for the duration of the COVID-19 declaration under Se ction 564(b)(1) of the Act, 21 U.S.C. section 360bbb-3(b)(1), unless the authorization is terminated or revoked sooner.  Performed at [facility], [place] [address], [place], [place] [address]   Urine Culture     Status: Abnormal   Collection  Time: 04/01/21 12:06 AM   Specimen: Urine, Random  Result Value Ref Range Status   Specimen Description   Final    URINE, RANDOM Performed at [facility], [address], [place], [place] [address]    Special Requests   Final    NONE Performed at [facility], [address] [place], [place] [address]    Culture (A)  Final    <10,000 COLONIES/mL INSIGNIFICANT GROWTH Performed at [facility], [place] [address] [place], [place] [address]    Report Status 04/02/2021 FINAL  Final          Radiology Studies: DG Abd 2 Views  Result Date: 04/01/2021 CLINICAL DATA:  Hypertension. EXAM: ABDOMEN -  2 VIEW COMPARISON:  None. FINDINGS: The bowel gas pattern is normal. There is no evidence of free air. No radio-opaque calculi or other significant radiographic abnormality is seen. IMPRESSION: Negative. Electronically Signed   By: Misty Stanley M.D.   On: 04/01/2021 12:50        Scheduled Meds:  vitamin C  1,000 mg Oral Daily   clopidogrel  75 mg Oral Daily   DULoxetine  60 mg Oral Daily   enoxaparin (LOVENOX) injection  40 mg Subcutaneous Q24H   gabapentin  600 mg Oral QID   ibuprofen  200 mg Oral Once   insulin aspart  0-5 Units Subcutaneous QHS   insulin aspart  0-9 Units Subcutaneous TID WC   insulin glargine  80 Units Subcutaneous QHS   ipratropium  1 spray Each Nare BID   l-methylfolate-B6-B12  1 tablet Oral Daily   levothyroxine  50 mcg Oral Q0600   melatonin  5 mg Oral QHS   omega-3 acid ethyl esters  1 g Oral BID   pantoprazole  40 mg Oral Daily   polyethylene glycol  17 g Oral Daily   pramipexole  1 mg Oral q1600   rosuvastatin  40 mg Oral Daily   Semaglutide (1 MG/DOSE)  1 mg Subcutaneous Q Sat   senna-docusate  2 tablet Oral BID   tamsulosin  0.4 mg Oral QPC breakfast   Continuous Infusions:  sodium chloride 100 mL/hr at 04/02/21 0138   cefTRIAXone (ROCEPHIN)  IV 1 g (04/01/21 2358)     LOS: 2 days        Hosie Poisson,  MD [facility]   To contact the attending provider between 7A-7P or the covering provider during after hours 7P-7A, please log into the web site [email or web address] and access using universal [employer] password for that web site. If you do not have the password, please call the hospital operator.  04/02/2021, 9:53 AM

## 2021-04-03 LAB — BASIC METABOLIC PANEL
Anion gap: 6 (ref 5–15)
BUN: 26 mg/dL — ABNORMAL HIGH (ref 8–23)
CO2: 25 mmol/L (ref 22–32)
Calcium: 8 mg/dL — ABNORMAL LOW (ref 8.9–10.3)
Chloride: 106 mmol/L (ref 98–111)
Creatinine, Ser: 1.29 mg/dL — ABNORMAL HIGH (ref 0.61–1.24)
GFR, Estimated: 54 mL/min — ABNORMAL LOW (ref >60)
Glucose, Bld: 75 mg/dL (ref 70–99)
Potassium: 3.9 mmol/L (ref 3.5–5.1)
Sodium: 137 mmol/L (ref 135–145)

## 2021-04-03 LAB — CBC WITH DIFFERENTIAL/PLATELET
Abs Immature Granulocytes: 0.02 10*3/uL (ref 0.00–0.07)
Basophils Absolute: 0 10*3/uL (ref 0.0–0.1)
Basophils Relative: 0 %
Eosinophils Absolute: 0.2 10*3/uL (ref 0.0–0.5)
Eosinophils Relative: 3 %
HCT: 39.5 % (ref 39.0–52.0)
Hemoglobin: 13 g/dL (ref 13.0–17.0)
Immature Granulocytes: 0 %
Lymphocytes Relative: 16 %
Lymphs Abs: 0.8 10*3/uL (ref 0.7–4.0)
MCH: 30.1 pg (ref 26.0–34.0)
MCHC: 32.9 g/dL (ref 30.0–36.0)
MCV: 91.4 fL (ref 80.0–100.0)
Monocytes Absolute: 0.6 10*3/uL (ref 0.1–1.0)
Monocytes Relative: 12 %
Neutro Abs: 3.4 10*3/uL (ref 1.7–7.7)
Neutrophils Relative %: 69 %
Platelets: 146 10*3/uL — ABNORMAL LOW (ref 150–400)
RBC: 4.32 MIL/uL (ref 4.22–5.81)
RDW: 13.4 % (ref 11.5–15.5)
WBC: 5 10*3/uL (ref 4.0–10.5)
nRBC: 0 % (ref 0.0–0.2)

## 2021-04-03 LAB — GLUCOSE, CAPILLARY
Glucose-Capillary: 82 mg/dL (ref 70–99)
Glucose-Capillary: 203 mg/dL — ABNORMAL HIGH (ref 70–99)
Glucose-Capillary: 135 mg/dL — ABNORMAL HIGH (ref 70–99)
Glucose-Capillary: 137 mg/dL — ABNORMAL HIGH (ref 70–99)

## 2021-04-03 MED ORDER — INSULIN GLARGINE 100 UNIT/ML ~~LOC~~ SOLN
70.0000 [IU] | Freq: Every day | SUBCUTANEOUS | Status: DC
  Administered 2021-04-03: 70 [IU] via SUBCUTANEOUS
  Filled 2021-04-03 (×2): qty 0.7

## 2021-04-03 NOTE — TOC Transition Note (Addendum)
Transition of Care [facility]) - CM/SW Discharge Note   Patient Details  Name: James FRANGOS, MD MRN: [id number] Date of Birth: [birth year]  [facility]) CM/SW Contact:  Izola Price, RN Phone Number: 04/03/2021, 10:18 AM   Clinical Narrative:   7/17: Continued medical work up 7/16 to rule out abd. obstruction. Bed search: [facility] patient in hub as of 1020 am today. Simmie Davies RN CM   Post progression update: Unit RN reported that patient stated ALF can provide therapy if able to return to ALF/[facility] which is his primary goal. Simmie Davies RN CM        Patient Goals and CMS Choice        Discharge Placement                       Discharge Plan and Services                                     Social Determinants of Health (SDOH) Interventions     Readmission Risk Interventions No flowsheet data found.

## 2021-04-03 NOTE — Progress Notes (Signed)
PROGRESS NOTE    James Spurr, MD  GNO:[id number] DOB: May 29, 1931 DOA: 03/31/2021 PCP: [name], MD    Chief Complaint  Patient presents with   Urinary Frequency    Brief Narrative:  87 year old gentleman with prior history of migraine, hypertension, obstructive sleep apnea on CPAP, type 2 diabetes mellitus stage IIIa CKD, dyslipidemia, see history of CVA with right-sided hemiparesis, GERD, restless leg syndrome presents to ED with acute onset of generalized weakness and multiple falls.  On arrival to ED he was found to have a UTI.  Imaging of pelvis and hips did not show any acute abnormalities.  Chest x-ray is negative for acute cardiopulmonary disease.  He was admitted for evaluation and treatment of dehydration and urinary tract infection.    Assessment & Plan:   Active Problems:   History of CVA (cerebrovascular accident)   Type 2 diabetes mellitus, uncontrolled, with neuropathy (HCC)   OSA on CPAP   CKD stage 3 secondary to diabetes ([place])   Hyperlipidemia associated with type 2 diabetes mellitus (HCC)   Restless leg syndrome   GERD (gastroesophageal reflux disease)   Migraines   HTN (hypertension)   UTI (urinary tract infection)   Generalized weakness, multiple falls, urinary tract infection Patient was started on IV Rocephin and IV fluids. Urine culture show insignificant growth, complete 3 days of IV Rocephin and discontinue the antibiotics.  Therapy evaluations ordered for deconditioning and generalized weakness recommending SNF.    AKI superimposed on stage IIIa CKD Baseline creatinine around 1.3.  He was admitted with a creatinine of 1.5. Gently hydrated and repeat renal parameters are back to baseline. Creatinine around 1.29.     Obstructive sleep apnea Continue with CPAP at night.   Type 2 diabetes mellitus Uncontrolled with neuropathy CBG (last 3)  Recent Labs    04/02/21 2034 04/03/21 0854 04/03/21 1213  GLUCAP 162* 82 203*    Decrease the dose of lantus from 80 units to 70 units . Continue with sliding scale insulin and gabapentin for peripheral neuropathy. Discussed about decreasing the dose of gabapentin , but he is adamant about keeping the same dose,he reports having severe RLS .  Hemoglobin A1c is 8.5%   Essential hypertension Blood pressure parameters are well controlled.    Mild abdominal pain, constipation Bowel regimen ordered an abdominal films to evaluate for obstruction. Abd x rays are negative for obstruction or constipation.  CT abd and pelvis without contrast to evaluate for left lower quadrant pain. Which was negative for acute pathology.  Pain control.     Mild thrombocytopenia No signs of bleeding at this time.   GERD Stable continue with PPI   DVT prophylaxis: (Lovenox) Code Status: (Full Code) Family Communication: none at bedside.  Disposition:   Status is: Inpatient  Remains inpatient appropriate because:Ongoing diagnostic testing needed not appropriate for outpatient work up, Unsafe d/c plan, and IV treatments appropriate due to intensity of illness or inability to take PO  Dispo: The patient is from: ALF              Anticipated d/c is to:  PENDING.               Patient currently is not medically stable to d/c.   Difficult to place patient No       Consultants:  None.   Procedures: none.   Antimicrobials:  Antibiotics Given (last 72 hours)     Date/Time Action Medication Dose Rate   03/31/21 2253 New Bag/Given  cefTRIAXone (ROCEPHIN) 1 g in sodium chloride 0.9 % 100 mL IVPB 1 g 200 mL/hr   04/01/21 2358 New Bag/Given   cefTRIAXone (ROCEPHIN) 1 g in sodium chloride 0.9 % 100 mL IVPB 1 g 200 mL/hr   04/02/21 2144 Given   cephALEXin (KEFLEX) capsule 500 mg 500 mg    04/03/21 0844 Given   cephALEXin (KEFLEX) capsule 500 mg 500 mg          Subjective: No chest pain or sob. Abd pain has improved.    Objective: Vitals:   04/02/21 1505 04/02/21  2008 04/03/21 0521 04/03/21 0830  BP: 108/64 117/69 128/69 112/87  Pulse: 76 72 78 79  Resp: 15 17 17 18   Temp: 97.9 F (36.6 C) (!) 97.4 F (36.3 C) 97.7 F (36.5 C) 98.3 F (36.8 C)  TempSrc: Oral     SpO2:  100% 99% 99%  Weight:      Height:        Intake/Output Summary (Last 24 hours) at 04/03/2021 1321 Last data filed at 04/03/2021 1134 Gross per 24 hour  Intake 240 ml  Output 851 ml  Net -611 ml    Filed Weights   03/31/21 1849  Weight: 83.9 kg    Examination:  General exam: elderly gentleman, not in distress.  Respiratory system: clear to ausculation, no wheezing heard.  Cardiovascular system: S1 & S2 heard, RRR, no JVD, no  pedal edema.  Gastrointestinal system: Abdomen is soft, non tender non distended bowel sounds wnl Central nervous system: alert and oriented, non focal.  Extremities: no pedal edema.  Skin: no rashes seen.  Psychiatry: Mood is appropriate. .     Data Reviewed: I have personally reviewed following labs and imaging studies  CBC: Recent Labs  Lab 03/31/21 0348 03/31/21 2238 04/01/21 0500 04/03/21 0445  WBC 15.4* 14.6* 12.4* 5.0  NEUTROABS 12.9* 11.6*  --  3.4  HGB 14.4 14.4 13.3 13.0  HCT 43.6 44.1 38.8* 39.5  MCV 90.8 91.1 88.8 91.4  PLT 162 148* 139* 146*     Basic Metabolic Panel: Recent Labs  Lab 03/31/21 0348 03/31/21 2238 04/01/21 0500 04/02/21 1043 04/03/21 0445  NA 135 137 139 136 137  K 4.1 3.7 3.6 3.6 3.9  CL 103 101 108 106 106  CO2 25 27 27 24 25   GLUCOSE 220* 161* 74 134* 75  BUN 28* 25* 23 23 26*  CREATININE 1.52* 1.53* 1.37* 1.31* 1.29*  CALCIUM 8.8* 8.7* 8.3* 7.9* 8.0*     GFR: Estimated Creatinine Clearance: 42.6 mL/min (A) (by C-G formula based on SCr of 1.29 mg/dL (H)).  Liver Function Tests: No results for input(s): AST, ALT, ALKPHOS, BILITOT, PROT, ALBUMIN in the last 168 hours.  CBG: Recent Labs  Lab 04/02/21 1246 04/02/21 1621 04/02/21 2034 04/03/21 0854 04/03/21 1213  GLUCAP 126*  140* 162* 82 203*      Recent Results (from the past 240 hour(s))  Resp Panel by RT-PCR (Flu A&B, Covid) Nasopharyngeal Swab     Status: None   Collection Time: 03/31/21  3:48 AM   Specimen: Nasopharyngeal Swab; Nasopharyngeal(NP) swabs in vial transport medium  Result Value Ref Range Status   SARS Coronavirus 2 by RT PCR NEGATIVE NEGATIVE Final    Comment: (NOTE) SARS-CoV-2 target nucleic acids are NOT DETECTED.  The SARS-CoV-2 RNA is generally detectable in upper respiratory specimens during the acute phase of infection. The lowest concentration of SARS-CoV-2 viral copies this assay can detect is 138 copies/mL. A negative  result does not preclude SARS-Cov-2 infection and should not be used as the sole basis for treatment or other patient management decisions. A negative result may occur with  improper specimen collection/handling, submission of specimen other than nasopharyngeal swab, presence of viral mutation(s) within the areas targeted by this assay, and inadequate number of viral copies(<138 copies/mL). A negative result must be combined with clinical observations, patient history, and epidemiological information. The expected result is Negative.  Fact Sheet for Patients:  EntrepreneurPulse.com.au  Fact Sheet for Healthcare Providers:  IncredibleEmployment.be  This test is no t yet approved or cleared by the [place] FDA and  has been authorized for detection and/or diagnosis of SARS-CoV-2 by FDA under an Emergency Use Authorization (EUA). This EUA will remain  in effect (meaning this test can be used) for the duration of the COVID-19 declaration under Section 564(b)(1) of the Act, 21 U.S.C.section 360bbb-3(b)(1), unless the authorization is terminated  or revoked sooner.       Influenza A by PCR NEGATIVE NEGATIVE Final   Influenza B by PCR NEGATIVE NEGATIVE Final    Comment: (NOTE) The Xpert Xpress SARS-CoV-2/FLU/RSV plus  assay is intended as an aid in the diagnosis of influenza from Nasopharyngeal swab specimens and should not be used as a sole basis for treatment. Nasal washings and aspirates are unacceptable for Xpert Xpress SARS-CoV-2/FLU/RSV testing.  Fact Sheet for Patients: EntrepreneurPulse.com.au  Fact Sheet for Healthcare Providers: IncredibleEmployment.be  This test is not yet approved or cleared by the [place] FDA and has been authorized for detection and/or diagnosis of SARS-CoV-2 by FDA under an Emergency Use Authorization (EUA). This EUA will remain in effect (meaning this test can be used) for the duration of the COVID-19 declaration under Section 564(b)(1) of the Act, 21 U.S.C. section 360bbb-3(b)(1), unless the authorization is terminated or revoked.  Performed at [facility], [place], [place] [address]   SARS CORONAVIRUS 2 (TAT 6-24 HRS) Nasopharyngeal Nasopharyngeal Swab     Status: None   Collection Time: 03/31/21 10:38 PM   Specimen: Nasopharyngeal Swab  Result Value Ref Range Status   SARS Coronavirus 2 NEGATIVE NEGATIVE Final    Comment: (NOTE) SARS-CoV-2 target nucleic acids are NOT DETECTED.  The SARS-CoV-2 RNA is generally detectable in upper and lower respiratory specimens during the acute phase of infection. Negative results do not preclude SARS-CoV-2 infection, do not rule out co-infections with other pathogens, and should not be used as the sole basis for treatment or other patient management decisions. Negative results must be combined with clinical observations, patient history, and epidemiological information. The expected result is Negative.  Fact Sheet for Patients: SugarRoll.be  Fact Sheet for Healthcare Providers: https://www.woods-mathews.com/  This test is not yet approved or cleared by the [place] FDA and  has been authorized for  detection and/or diagnosis of SARS-CoV-2 by FDA under an Emergency Use Authorization (EUA). This EUA will remain  in effect (meaning this test can be used) for the duration of the COVID-19 declaration under Se ction 564(b)(1) of the Act, 21 U.S.C. section 360bbb-3(b)(1), unless the authorization is terminated or revoked sooner.  Performed at [facility], [place] [address] [place], [place] [address]   Urine Culture     Status: Abnormal   Collection Time: 04/01/21 12:06 AM   Specimen: Urine, Random  Result Value Ref Range Status   Specimen Description   Final    URINE, RANDOM Performed at [facility], [place],  [name] [address]    Special Requests   Final    NONE Performed at [facility], [address] [place], [place] [address]    Culture (A)  Final    <10,000 COLONIES/mL INSIGNIFICANT GROWTH Performed at [facility] [address], [place], [place] [address]    Report Status 04/02/2021 FINAL  Final          Radiology Studies: CT ABDOMEN WO CONTRAST  Result Date: 04/02/2021 CLINICAL DATA:  Nausea vomiting and left upper quadrant abdominal pain. EXAM: CT ABDOMEN WITHOUT CONTRAST TECHNIQUE: Multidetector CT imaging of the abdomen was performed following the standard protocol without IV contrast. COMPARISON:  July 01, 2020 FINDINGS: Lower chest: No acute abnormality. Hepatobiliary: No focal liver abnormality is seen. No gallstones, gallbladder wall thickening, or biliary dilatation. Pancreas: Mostly fatty replaced. Spleen: Normal in size without focal abnormality. Adrenals/Urinary Tract: Normal adrenal glands. No evidence of obstructive uropathy or renal calculi. Bilateral renal cysts. Stomach/Bowel: Stomach is within normal limits. No evidence of bowel wall thickening, distention, or inflammatory changes, where visualized. Vascular/Lymphatic: Aortic atherosclerosis. No enlarged abdominal lymph nodes. 1.3 cm rim calcified  splenic artery aneurysm, stable from 2021. Other: No abdominal wall hernia or abnormality. Musculoskeletal: Diffuse spondylosis of the thoracic spine. IMPRESSION: 1. No evidence of obstructive uropathy or renal calculi. 2. Bilateral renal cysts. 3. Stable 1.3 cm rim calcified splenic artery aneurysm. 4. Aortic atherosclerosis. Aortic Atherosclerosis (ICD10-I70.0). Electronically Signed   By: Fidela Salisbury M.D.   On: 04/02/2021 13:23        Scheduled Meds:  vitamin C  1,000 mg Oral Daily   cephALEXin  500 mg Oral BID   clopidogrel  75 mg Oral Daily   DULoxetine  60 mg Oral Daily   enoxaparin (LOVENOX) injection  40 mg Subcutaneous Q24H   gabapentin  600 mg Oral QID   insulin aspart  0-5 Units Subcutaneous QHS   insulin aspart  0-9 Units Subcutaneous TID WC   insulin glargine  80 Units Subcutaneous QHS   ipratropium  1 spray Each Nare BID   l-methylfolate-B6-B12  1 tablet Oral Daily   levothyroxine  50 mcg Oral Q0600   melatonin  5 mg Oral QHS   omega-3 acid ethyl esters  1 g Oral BID   pantoprazole  40 mg Oral Daily   polyethylene glycol  17 g Oral Daily   pramipexole  1 mg Oral q1600   rosuvastatin  40 mg Oral Daily   Semaglutide (1 MG/DOSE)  1 mg Subcutaneous Q Sat   senna-docusate  2 tablet Oral BID   tamsulosin  0.4 mg Oral QPC breakfast   Continuous Infusions:  sodium chloride 75 mL/hr at 04/03/21 0519     LOS: 3 days        Hosie Poisson, MD [facility]   To contact the attending provider between 7A-7P or the covering provider during after hours 7P-7A, please log into the web site [email or web address] and access using universal [place] password for that web site. If you do not have the password, please call the hospital operator.  04/03/2021, 1:21 PM

## 2021-04-04 DIAGNOSIS — Z7189 Other specified counseling: Secondary | ICD-10-CM

## 2021-04-04 LAB — GLUCOSE, CAPILLARY
Glucose-Capillary: 58 mg/dL — ABNORMAL LOW (ref 70–99)
Glucose-Capillary: 74 mg/dL (ref 70–99)
Glucose-Capillary: 97 mg/dL (ref 70–99)
Glucose-Capillary: 199 mg/dL — ABNORMAL HIGH (ref 70–99)
Glucose-Capillary: 193 mg/dL — ABNORMAL HIGH (ref 70–99)
Glucose-Capillary: 152 mg/dL — ABNORMAL HIGH (ref 70–99)

## 2021-04-04 MED ORDER — INSULIN GLARGINE 100 UNIT/ML ~~LOC~~ SOLN
50.0000 [IU] | Freq: Every day | SUBCUTANEOUS | Status: DC
  Administered 2021-04-04: 50 [IU] via SUBCUTANEOUS
  Filled 2021-04-04 (×2): qty 0.5

## 2021-04-04 MED ORDER — IBUPROFEN 400 MG PO TABS
200.0000 mg | ORAL_TABLET | Freq: Every day | ORAL | Status: DC | PRN
  Administered 2021-04-04: 200 mg via ORAL
  Filled 2021-04-04: qty 1

## 2021-04-04 MED ORDER — TRAMADOL HCL 50 MG PO TABS: 50.0000 mg | ORAL_TABLET | Freq: Four times a day (QID) | ORAL | Status: DC | PRN

## 2021-04-04 NOTE — Progress Notes (Signed)
Physical Therapy Treatment Patient Details Name: James MCOMBER, MD MRN: [id number] DOB: 1931/08/23 Today's Date: 04/04/2021    History of Present Illness Per MD notes, pt is an 87 year old gentleman with prior history of migraine, hypertension, obstructive sleep apnea on CPAP, type 2 diabetes mellitus stage IIIa CKD, dyslipidemia, see history of CVA with right-sided hemiparesis, GERD, restless leg syndrome presented to ED with acute onset of generalized weakness and multiple falls. MD assessment includes generalized weakness, multiple falls, urinary tract infection, and AKI superimposed on stage IIIa CKD.    PT Comments    Supine ex x 10 prior to OOB.  To EOB with min guard and verbal cues for sequencing.  Generally steady in sitting.  Stood with min a x 1 and elevated bed height.  Initially unsteady in standing but after several minutes of wight shifting and marching,  he is able to obtain balance.  Some increased forward lean with dynamic activities.  He is able to take several hesitant steps to recliner at bedside but further gait is limited by general fatigue.  Overall improvement and tolerance from last session.  SNF remains appropriate for discharge.  Motivated to return to PLOF.   Follow Up Recommendations  SNF;Supervision for mobility/OOB     Equipment Recommendations  None recommended by PT    Recommendations for Other Services       Precautions / Restrictions Precautions Precautions: Fall Restrictions Weight Bearing Restrictions: No    Mobility  Bed Mobility Overal bed mobility: Needs Assistance Bed Mobility: Supine to Sit     Supine to sit: Min assist          Transfers Overall transfer level: Needs assistance Equipment used: Rolling walker (2 wheeled) Transfers: Sit to/from Stand Sit to Stand: Min assist;From elevated surface            Ambulation/Gait Ambulation/Gait assistance: Min Web designer (Feet): 5 Feet Assistive device: Rolling  walker (2 wheeled) Gait Pattern/deviations: Step-to pattern;Decreased step length - right;Decreased step length - left;Decreased stride length;Trunk flexed Gait velocity: decreased       Stairs             Wheelchair Mobility    Modified Rankin (Stroke Patients Only)       Balance Overall balance assessment: Needs assistance Sitting-balance support: Bilateral upper extremity supported;Feet supported Sitting balance-Leahy Scale: Fair     Standing balance support: Bilateral upper extremity supported Standing balance-Leahy Scale: Poor                              Cognition Arousal/Alertness: Awake/alert Behavior During Therapy: WFL for tasks assessed/performed Overall Cognitive Status: Within Functional Limits for tasks assessed                                        Exercises Other Exercises Other Exercises: extended time in standing this session    General Comments        Pertinent Vitals/Pain Pain Assessment: Faces Faces Pain Scale: Hurts little more Pain Location: abdomen Pain Descriptors / Indicators: Aching;Sore;Discomfort Pain Intervention(s): Limited activity within patient's tolerance;Monitored during session;Repositioned    Home Living                      Prior Function            PT Goals (current goals can  now be found in the care plan section) Progress towards PT goals: Progressing toward goals    Frequency    Min 1X/week      PT Plan Current plan remains appropriate    Co-evaluation              AM-PAC PT "6 Clicks" Mobility   Outcome Measure  Help needed turning from your back to your side while in a flat bed without using bedrails?: A Little Help needed moving from lying on your back to sitting on the side of a flat bed without using bedrails?: A Little Help needed moving to and from a bed to a chair (including a wheelchair)?: A Little Help needed standing up from a chair using  your arms (e.g., wheelchair or bedside chair)?: A Little Help needed to walk in hospital room?: A Lot Help needed climbing 3-5 steps with a railing? : Total 6 Click Score: 15    End of Session Equipment Utilized During Treatment: Gait belt Activity Tolerance: Patient tolerated treatment well Patient left: in chair;with call bell/phone within reach;with chair alarm set Nurse Communication: Mobility status PT Visit Diagnosis: Unsteadiness on feet (R26.81);Difficulty in walking, not elsewhere classified (R26.2);Muscle weakness (generalized) (M62.81);History of falling (Z91.81);Pain     Time: 7741-4239 PT Time Calculation (min) (ACUTE ONLY): 23 min  Charges:  $Therapeutic Exercise: 8-22 mins $Therapeutic Activity: 8-22 mins                    Chesley Noon, PTA 04/04/21, 2:27 PM , 2:24 PM

## 2021-04-04 NOTE — Progress Notes (Signed)
Hypoglycemic Event  CBG: 58  Treatment: 4 oz juice/soda  Symptoms: Pale and Sweaty  Follow-up CBG: Time:0329 CBG Result:78  Possible Reasons for Event: Unknown  Comments/MD notified:Kakrakandy MD    Rogelia Rohrer

## 2021-04-04 NOTE — TOC Progression Note (Signed)
Transition of Care [facility]) - Progression Note    Patient Details  Name: SHAHEEN MENDE, MD MRN: [id number] Date of Birth: [birth year]  [facility] CM/SW [place], RN Phone Number: 04/04/2021, 10:59 AM  Clinical Narrative:    Met with the patient to review bed offers for STR, He chose [facility], I notified Tammy at [facility], and started the insurance process, awaiting approval     Barriers to Discharge: Continued Medical Work up  Expected Discharge Plan and Services                                                 Social Determinants of Health (SDOH) Interventions    Readmission Risk Interventions No flowsheet data found.

## 2021-04-04 NOTE — Progress Notes (Addendum)
PROGRESS NOTE    James Spurr, MD  LKG:[id number] DOB: 1931/07/28 DOA: 03/31/2021 PCP: [name], MD    Chief Complaint  Patient presents with   Urinary Frequency    Brief Narrative:  87 year old gentleman with prior history of migraine, hypertension, obstructive sleep apnea on CPAP, type 2 diabetes mellitus stage IIIa CKD, dyslipidemia, see history of CVA with right-sided hemiparesis, GERD, restless leg syndrome presents to ED with acute onset of generalized weakness and multiple falls.  On arrival to ED he was found to have a UTI.  Imaging of pelvis and hips did not show any acute abnormalities.  Chest x-ray is negative for acute cardiopulmonary disease.  He was admitted for evaluation and treatment of dehydration and urinary tract infection. Pt seen and examined, no new complaints at this time.     Assessment & Plan:   Active Problems:   History of CVA (cerebrovascular accident)   Type 2 diabetes mellitus, uncontrolled, with neuropathy (HCC)   OSA on CPAP   CKD stage 3 secondary to diabetes ([place])   Hyperlipidemia associated with type 2 diabetes mellitus (HCC)   Restless leg syndrome   GERD (gastroesophageal reflux disease)   Migraines   HTN (hypertension)   UTI (urinary tract infection)   Generalized weakness, multiple falls, urinary tract infection Patient was started on IV Rocephin and IV fluids. Urine culture show insignificant growth, complete 3 days of IV Rocephin and discontinue the antibiotics.  Therapy evaluations ordered for deconditioning and generalized weakness recommending SNF.    AKI superimposed on stage IIIa CKD Baseline creatinine around 1.3.  He was admitted with a creatinine of 1.5. Gently hydrated and repeat renal parameters are back to baseline. Creatinine around 1.29.     Obstructive sleep apnea Continue with CPAP at night.   Type 2 diabetes mellitus Uncontrolled with neuropathy CBG (last 3)  Recent Labs    04/04/21 0311  04/04/21 0329 04/04/21 0809  GLUCAP 58* 74 97   Decrease the dose of lantus from 70 units to 50 units . Continue with sliding scale insulin and gabapentin for peripheral neuropathy. Discussed about decreasing the dose of gabapentin , but he is adamant about keeping the same dose,he reports having severe RLS .  Hemoglobin A1c is 8.5%.   Essential hypertension Blood pressure parameters are well controlled.    Mild abdominal pain, constipation Bowel regimen ordered an abdominal films to evaluate for obstruction. Abd x rays are negative for obstruction or constipation.  CT abd and pelvis without contrast to evaluate for left lower quadrant pain. Which was negative for acute pathology.  Pain control.     Mild thrombocytopenia No signs of bleeding at this time.   GERD Stable continue with PPI   DVT prophylaxis: (Lovenox) Code Status: (Full Code) Family Communication: none at bedside.  Disposition:   Status is: Inpatient  Remains inpatient appropriate because:Unsafe d/c plan  Dispo: The patient is from: ALF              Anticipated d/c is to:  snf              Patient currently is medically stable to d/c.   Difficult to place patient No       Consultants:  None.   Procedures: none.   Antimicrobials:  Antibiotics Given (last 72 hours)     Date/Time Action Medication Dose Rate   04/01/21 2358 New Bag/Given   cefTRIAXone (ROCEPHIN) 1 g in sodium chloride 0.9 % 100 mL IVPB 1 g  200 mL/hr   04/02/21 2144 Given   cephALEXin (KEFLEX) capsule 500 mg 500 mg    04/03/21 0844 Given   cephALEXin (KEFLEX) capsule 500 mg 500 mg    04/03/21 2101 Given   cephALEXin (KEFLEX) capsule 500 mg 500 mg          Subjective:  No chest pain or sob, no nausea, or vomiting.   Objective: Vitals:   04/03/21 1531 04/03/21 2045 04/04/21 0310 04/04/21 0816  BP: 124/63 126/66 (!) 143/79 106/63  Pulse: 71 79 65 68  Resp: 18 18 20 16   Temp: 97.9 F (36.6 C) 99.2 F (37.3 C)  98.2 F (36.8 C) 98.3 F (36.8 C)  TempSrc:   Oral   SpO2: 97% 99% 93% 100%  Weight:      Height:        Intake/Output Summary (Last 24 hours) at 04/04/2021 1059 Last data filed at 04/04/2021 1016 Gross per 24 hour  Intake 120 ml  Output 801 ml  Net -681 ml    Filed Weights   03/31/21 1849  Weight: 83.9 kg    Examination:  General exam: elderly gentleman, no in distress.  Respiratory system: clear to auscultation, no wheezing or rhonchi.  Cardiovascular system: S1 & S2 heard, RRR, no JVD, no pedal edema.  Gastrointestinal system: Abdomen is soft, NT ND BS+ Central nervous system: alert , in good spirits and oriented, non focal.  Extremities: no leg edema.  Skin: no rashes or ulcers seen.  Psychiatry: Mood is appropriate.     Data Reviewed: I have personally reviewed following labs and imaging studies  CBC: Recent Labs  Lab 03/31/21 0348 03/31/21 2238 04/01/21 0500 04/03/21 0445  WBC 15.4* 14.6* 12.4* 5.0  NEUTROABS 12.9* 11.6*  --  3.4  HGB 14.4 14.4 13.3 13.0  HCT 43.6 44.1 38.8* 39.5  MCV 90.8 91.1 88.8 91.4  PLT 162 148* 139* 146*     Basic Metabolic Panel: Recent Labs  Lab 03/31/21 0348 03/31/21 2238 04/01/21 0500 04/02/21 1043 04/03/21 0445  NA 135 137 139 136 137  K 4.1 3.7 3.6 3.6 3.9  CL 103 101 108 106 106  CO2 25 27 27 24 25   GLUCOSE 220* 161* 74 134* 75  BUN 28* 25* 23 23 26*  CREATININE 1.52* 1.53* 1.37* 1.31* 1.29*  CALCIUM 8.8* 8.7* 8.3* 7.9* 8.0*     GFR: Estimated Creatinine Clearance: 42.6 mL/min (A) (by C-G formula based on SCr of 1.29 mg/dL (H)).  Liver Function Tests: No results for input(s): AST, ALT, ALKPHOS, BILITOT, PROT, ALBUMIN in the last 168 hours.  CBG: Recent Labs  Lab 04/03/21 1641 04/03/21 2102 04/04/21 0311 04/04/21 0329 04/04/21 0809  GLUCAP 135* 137* 58* 74 97      Recent Results (from the past 240 hour(s))  Resp Panel by RT-PCR (Flu A&B, Covid) Nasopharyngeal Swab     Status: None    Collection Time: 03/31/21  3:48 AM   Specimen: Nasopharyngeal Swab; Nasopharyngeal(NP) swabs in vial transport medium  Result Value Ref Range Status   SARS Coronavirus 2 by RT PCR NEGATIVE NEGATIVE Final    Comment: (NOTE) SARS-CoV-2 target nucleic acids are NOT DETECTED.  The SARS-CoV-2 RNA is generally detectable in upper respiratory specimens during the acute phase of infection. The lowest concentration of SARS-CoV-2 viral copies this assay can detect is 138 copies/mL. A negative result does not preclude SARS-Cov-2 infection and should not be used as the sole basis for treatment or other patient management decisions.  A negative result may occur with  improper specimen collection/handling, submission of specimen other than nasopharyngeal swab, presence of viral mutation(s) within the areas targeted by this assay, and inadequate number of viral copies(<138 copies/mL). A negative result must be combined with clinical observations, patient history, and epidemiological information. The expected result is Negative.  Fact Sheet for Patients:  EntrepreneurPulse.com.au  Fact Sheet for Healthcare Providers:  IncredibleEmployment.be  This test is no t yet approved or cleared by the [place] FDA and  has been authorized for detection and/or diagnosis of SARS-CoV-2 by FDA under an Emergency Use Authorization (EUA). This EUA will remain  in effect (meaning this test can be used) for the duration of the COVID-19 declaration under Section 564(b)(1) of the Act, 21 U.S.C.section 360bbb-3(b)(1), unless the authorization is terminated  or revoked sooner.       Influenza A by PCR NEGATIVE NEGATIVE Final   Influenza B by PCR NEGATIVE NEGATIVE Final    Comment: (NOTE) The Xpert Xpress SARS-CoV-2/FLU/RSV plus assay is intended as an aid in the diagnosis of influenza from Nasopharyngeal swab specimens and should not be used as a sole basis for treatment.  Nasal washings and aspirates are unacceptable for Xpert Xpress SARS-CoV-2/FLU/RSV testing.  Fact Sheet for Patients: EntrepreneurPulse.com.au  Fact Sheet for Healthcare Providers: IncredibleEmployment.be  This test is not yet approved or cleared by the [place] FDA and has been authorized for detection and/or diagnosis of SARS-CoV-2 by FDA under an Emergency Use Authorization (EUA). This EUA will remain in effect (meaning this test can be used) for the duration of the COVID-19 declaration under Section 564(b)(1) of the Act, 21 U.S.C. section 360bbb-3(b)(1), unless the authorization is terminated or revoked.  Performed at [facility], [place], [place] [address]   SARS CORONAVIRUS 2 (TAT 6-24 HRS) Nasopharyngeal Nasopharyngeal Swab     Status: None   Collection Time: 03/31/21 10:38 PM   Specimen: Nasopharyngeal Swab  Result Value Ref Range Status   SARS Coronavirus 2 NEGATIVE NEGATIVE Final    Comment: (NOTE) SARS-CoV-2 target nucleic acids are NOT DETECTED.  The SARS-CoV-2 RNA is generally detectable in upper and lower respiratory specimens during the acute phase of infection. Negative results do not preclude SARS-CoV-2 infection, do not rule out co-infections with other pathogens, and should not be used as the sole basis for treatment or other patient management decisions. Negative results must be combined with clinical observations, patient history, and epidemiological information. The expected result is Negative.  Fact Sheet for Patients: SugarRoll.be  Fact Sheet for Healthcare Providers: https://www.woods-mathews.com/  This test is not yet approved or cleared by the [place] FDA and  has been authorized for detection and/or diagnosis of SARS-CoV-2 by FDA under an Emergency Use Authorization (EUA). This EUA will remain  in effect (meaning this test can be  used) for the duration of the COVID-19 declaration under Se ction 564(b)(1) of the Act, 21 U.S.C. section 360bbb-3(b)(1), unless the authorization is terminated or revoked sooner.  Performed at [facility], [place] [address], [place], [place] [address]   Urine Culture     Status: Abnormal   Collection Time: 04/01/21 12:06 AM   Specimen: Urine, Random  Result Value Ref Range Status   Specimen Description   Final    URINE, RANDOM Performed at [facility], [address], [place], [place] [address]    Special Requests   Final    NONE Performed at [facility], [address] [place], [name] [address]    Culture (A)  Final    <10,000 COLONIES/mL INSIGNIFICANT GROWTH Performed at [facility] [address] [place], [place] [address]    Report Status 04/02/2021 FINAL  Final          Radiology Studies: CT ABDOMEN WO CONTRAST  Result Date: 04/02/2021 CLINICAL DATA:  Nausea vomiting and left upper quadrant abdominal pain. EXAM: CT ABDOMEN WITHOUT CONTRAST TECHNIQUE: Multidetector CT imaging of the abdomen was performed following the standard protocol without IV contrast. COMPARISON:  July 01, 2020 FINDINGS: Lower chest: No acute abnormality. Hepatobiliary: No focal liver abnormality is seen. No gallstones, gallbladder wall thickening, or biliary dilatation. Pancreas: Mostly fatty replaced. Spleen: Normal in size without focal abnormality. Adrenals/Urinary Tract: Normal adrenal glands. No evidence of obstructive uropathy or renal calculi. Bilateral renal cysts. Stomach/Bowel: Stomach is within normal limits. No evidence of bowel wall thickening, distention, or inflammatory changes, where visualized. Vascular/Lymphatic: Aortic atherosclerosis. No enlarged abdominal lymph nodes. 1.3 cm rim calcified splenic artery aneurysm, stable from 2021. Other: No abdominal wall hernia or abnormality. Musculoskeletal: Diffuse spondylosis of the thoracic spine.  IMPRESSION: 1. No evidence of obstructive uropathy or renal calculi. 2. Bilateral renal cysts. 3. Stable 1.3 cm rim calcified splenic artery aneurysm. 4. Aortic atherosclerosis. Aortic Atherosclerosis (ICD10-I70.0). Electronically Signed   By: Fidela Salisbury M.D.   On: 04/02/2021 13:23        Scheduled Meds:  vitamin C  1,000 mg Oral Daily   cephALEXin  500 mg Oral BID   clopidogrel  75 mg Oral Daily   DULoxetine  60 mg Oral Daily   enoxaparin (LOVENOX) injection  40 mg Subcutaneous Q24H   gabapentin  600 mg Oral QID   insulin aspart  0-5 Units Subcutaneous QHS   insulin aspart  0-9 Units Subcutaneous TID WC   insulin glargine  50 Units Subcutaneous QHS   ipratropium  1 spray Each Nare BID   l-methylfolate-B6-B12  1 tablet Oral Daily   levothyroxine  50 mcg Oral Q0600   melatonin  5 mg Oral QHS   omega-3 acid ethyl esters  1 g Oral BID   pantoprazole  40 mg Oral Daily   polyethylene glycol  17 g Oral Daily   pramipexole  1 mg Oral q1600   rosuvastatin  40 mg Oral Daily   Semaglutide (1 MG/DOSE)  1 mg Subcutaneous Q Sat   senna-docusate  2 tablet Oral BID   tamsulosin  0.4 mg Oral QPC breakfast   Continuous Infusions:     LOS: 4 days        Hosie Poisson, MD [facility]   To contact the attending provider between 7A-7P or the covering provider during after hours 7P-7A, please log into the web site [email or web address] and access using universal [place] password for that web site. If you do not have the password, please call the hospital operator.  04/04/2021, 10:59 AM

## 2021-04-04 NOTE — Consult Note (Signed)
Consultation Note Date: 04/04/2021   Patient Name: James MOAN, MD  DOB: 1930-10-21  MRN: [id number]  Age / Sex: 87 y.o., male  PCP: [name], MD Referring Physician: Hosie Poisson, MD  Reason for Consultation: Establishing goals of care  HPI/Patient Profile: 87 year old gentleman with prior history of migraine, hypertension, obstructive sleep apnea on CPAP, type 2 diabetes mellitus stage IIIa CKD, dyslipidemia, see history of CVA with right-sided hemiparesis, GERD, restless leg syndrome presents to ED with acute onset of generalized weakness and multiple falls.  On arrival to ED he was found to have a UTI.  Imaging of pelvis and hips did not show any acute abnormalities.  Chest x-ray is negative for acute cardiopulmonary disease.  He was admitted for evaluation and treatment of dehydration and urinary tract infection.  Clinical Assessment and Goals of Care: Patient is resting in bed. No family at bedside. He  is a retired Engineer, drilling. He lives in independent living.   He understands his admission and has been well updated on his status and articulates it well. He discusses plans to go to SNF to try to regain independence to return to independent living, as this is his most pressing goal.       SUMMARY OF RECOMMENDATIONS   Would recommend outpatient palliative to follow for [place] conversations over time.  Prognosis:  Unable to determine       Primary Diagnoses: Present on Admission:  UTI (urinary tract infection)  Type 2 diabetes mellitus, uncontrolled, with neuropathy (HCC)  Restless leg syndrome  Migraines  Hyperlipidemia associated with type 2 diabetes mellitus (HCC)  HTN (hypertension)  GERD (gastroesophageal reflux disease)  CKD stage 3 secondary to diabetes ([place])   I have reviewed the medical record, interviewed the patient and family, and examined the patient. The following  aspects are pertinent.  Past Medical History:  Diagnosis Date   Anemia    Basal cell carcinoma 2018   R temple    Basal cell carcinoma of nose 2002   bridge of nose   BPH with obstruction/lower urinary tract symptoms    failed flomax, uroxatral, myrbetriq - sees urology [name]) pending videourodynamics   CKD (chronic kidney disease) stage 3, GFR 30-59 ml/min ([place]) 03/11/2012   Renal US 02/2014 - multiple kidney cysts    CVA (cerebral infarction) 03/07/12   slurred speech; right hemplegia, left handed - completed PT 07/2014 (17 sessions)   Depression    mild, on cymbalta per prior PCP   Erectile dysfunction    per urology (prostaglandin injection)   GERD (gastroesophageal reflux disease)    with sliding HH   H/O hiatal hernia    History of kidney problems 12/2010   lacerated left kidney after fall   HLD (hyperlipidemia)    Hx of basal cell carcinoma 07/10/2017   R. lat. forehead near hair line   Kidney cysts 02/2014   bilateral (renal US)   Migraines 03/11/12   now rare   OSA on CPAP    sleep study 01/2014 - severe,  CPAP at 12, previously on nuvigil (Dr. [name])   Palpitations 01/2012   holter WNL, rare PAC/PVCs   Presbycusis of both ears 2016   rec amplification [facility])   Restless leg syndrome    well controlled on mirapex   Rosacea    [place]   Stroke [facility])    TIA (transient ischemic attack)    Type 2 diabetes, controlled, with neuropathy [facility]) 2011   [name] in [place] History   Socioeconomic History   Marital status: Divorced    Spouse name: Not on file   Number of children: 2   Years of education: MD   Highest education level: Not on file  Occupational History   Occupation: retired    Fish farm manager: RETIRED  Tobacco Use   Smoking status: Never   Smokeless tobacco: Never   Tobacco comments:    "used to smoke pipe when I was youngerFinancial risk analyst Use: Never used  Substance and Sexual Activity   Alcohol use: Yes    Alcohol/week: 4.0  standard drinks    Types: 2 Glasses of wine, 2 Shots of liquor per week    Comment: occasional   Drug use: No   Sexual activity: Yes  Other Topics Concern   Not on file  Social History Narrative   Moved into [place] in [place] 12/2020   Caffeine: 1 cup coffee/day     Occupation: ophthalmologist, retired   Edu: MD   Activity: going to gym 3x/wk with trainer   Diet: some water, fruits/vegetables some, red meat a few times, fish 1x/wk   POA - [name] (Daughter)   Social Determinants of Health   Financial Resource Strain: Low Risk    Difficulty of Paying Living Expenses: Not hard at all  Food Insecurity: Not on file  Transportation Needs: Not on file  Physical Activity: Not on file  Stress: Not on file  Social Connections: Not on file   Family History  Problem Relation Age of Onset   Stroke Father    Lung cancer Father 30        smoker   Stroke Brother    Diabetes Brother 38   Lung cancer Brother    Alcoholism Sister    Kidney disease Sister    Coronary artery disease Neg Hx    Scheduled Meds:  vitamin C  1,000 mg Oral Daily   cephALEXin  500 mg Oral BID   clopidogrel  75 mg Oral Daily   DULoxetine  60 mg Oral Daily   enoxaparin (LOVENOX) injection  40 mg Subcutaneous Q24H   gabapentin  600 mg Oral QID   insulin aspart  0-5 Units Subcutaneous QHS   insulin aspart  0-9 Units Subcutaneous TID WC   insulin glargine  50 Units Subcutaneous QHS   ipratropium  1 spray Each Nare BID   l-methylfolate-B6-B12  1 tablet Oral Daily   levothyroxine  50 mcg Oral Q0600   melatonin  5 mg Oral QHS   omega-3 acid ethyl esters  1 g Oral BID   pantoprazole  40 mg Oral Daily   polyethylene glycol  17 g Oral Daily   pramipexole  1 mg Oral q1600   rosuvastatin  40 mg Oral Daily   Semaglutide (1 MG/DOSE)  1 mg Subcutaneous Q Sat   senna-docusate  2 tablet Oral BID   tamsulosin  0.4 mg Oral QPC breakfast   Continuous Infusions: PRN Meds:.acetaminophen  **OR** acetaminophen,  bisacodyl, celecoxib, docusate sodium, famotidine, LORazepam, magnesium hydroxide, metoprolol tartrate, nitroGLYCERIN, ondansetron **OR** ondansetron (ZOFRAN) IV, sodium phosphate, traZODone Medications Prior to Admission:  Prior to Admission medications   Medication Sig Start Date End Date Taking? Authorizing Provider  Alpha-Lipoic Acid 600 MG CAPS Take 1 capsule (600 mg total) by mouth daily. Patient taking differently: Take 1 capsule by mouth in the morning and at bedtime. 12/07/19  Yes [name], MD  brimonidine [facility]) 0.2 % ophthalmic solution  03/25/21  Yes [provider]  clopidogrel (PLAVIX) 75 MG tablet TAKE 1 TABLET DAILY 01/11/21  Yes [name], MD  Coenzyme Q10 (COQ10) 150 MG CAPS Take 1 capsule by mouth daily.   Yes [provider]  COMBIGAN 0.2-0.5 % ophthalmic solution  03/25/21  Yes [provider]  dorzolamide-timolol (COSOPT) 22.3-6.8 MG/ML ophthalmic solution  12/17/20  Yes [provider]  DULoxetine (CYMBALTA) 60 MG capsule TAKE 1 CAPSULE DAILY 08/03/20  Yes [name], MD  gabapentin (NEURONTIN) 300 MG capsule Take 600 mg by mouth 3 (three) times daily. 600 mg BID is his minimum dose without having breakthrough symptoms. He is aware 900 mg/day is recommended per kidney function. 11/15/20  Yes [name], MD  insulin degludec (TRESIBA FLEXTOUCH) 200 UNIT/ML FlexTouch Pen Inject 90 Units into the skin at bedtime. As needed 08/03/20  Yes [name], MD  l-methylfolate-B6-B12 [facility]) 3-35-2 MG TABS tablet Take 1 tablet by mouth daily. 07/07/20  Yes Swayze, Ava, DO  levothyroxine (SYNTHROID) 50 MCG tablet Take 1 tablet (50 mcg total) by mouth daily before breakfast. Per endo [name] 12/07/19  Yes [name], MD  LUMIGAN 0.01 % SOLN Place 1 drop into the right eye at bedtime. 06/01/20  Yes [name], MD  melatonin 5 MG TABS Take 5 mg by mouth at bedtime.   Yes [provider]  NOVOLOG FLEXPEN 100 UNIT/ML FlexPen Inject 20-26 Units into the skin 2 (two) times daily before a meal. 03/25/21  Yes [provider]  Omega-3 Fatty Acids (FISH OIL) 1000 MG CAPS Take 1 capsule (1,000 mg total) by mouth in the morning and at bedtime. 06/01/20  Yes [name], MD  pramipexole (MIRAPEX) 1 MG tablet Take 1 tablet (1 mg total) by mouth See admin instructions. At 4pm 12/30/20  Yes [name], MD  RABEprazole (ACIPHEX) 20 MG tablet TAKE 1 TABLET DAILY 08/03/20  Yes [name], MD  rosuvastatin (CRESTOR) 40 MG tablet Take 1 tablet (40 mg total) by mouth daily. 04/22/20  Yes [name], MD  Semaglutide, 1 MG/DOSE, (OZEMPIC, 1 MG/DOSE,) 2 MG/1.5ML SOPN Inject 1 mg into the skin every Saturday. 06/08/20  Yes [provider]  tamsulosin (FLOMAX) 0.4 MG CAPS capsule Take 1 capsule (0.4 mg total) by mouth daily after breakfast. 12/30/20  Yes [name], MD  amoxicillin-clavulanate (AUGMENTIN) 875-125 MG tablet Take 1 tablet by mouth 2 (two) times daily for 10 days. 03/31/21 04/10/21  Rudene Re, MD  BD PEN NEEDLE NANO 2ND GEN 32G X 4 MM MISC USE TO INJECT MEDICATION DAILY 01/12/21   [name], MD  cephALEXin [facility]) 500 MG capsule  03/25/21   [provider]  Fluocinolone Acetonide 0.01 % OIL  03/25/21   [provider]  insulin lispro (HUMALOG) 100 UNIT/ML KwikPen Inject 20 Units into the skin daily at 12 noon. With largest meal (lunch) as well as occasionally with dinner; Sliding Scale prior to meals if needed Patient not taking: No sig reported 03/08/21   [name],  MD  ipratropium (ATROVENT) 0.03 % nasal spray  03/25/21   [provider]  JARDIANCE 10 MG TABS tablet  03/25/21   [provider]  LORazepam (ATIVAN) 0.5 MG tablet Take 1 tablet (0.5 mg total) by mouth at bedtime as needed for sleep. Patient not taking: No sig reported 11/30/20   [name], MD  metoprolol tartrate  (LOPRESSOR) 25 MG tablet TAKE ONE-HALF (1/2) TABLET (12.5 MG) AT BEDTIME WITH MORNING DOSE IF NEEDED Patient not taking: Reported on 04/01/2021 04/13/20   [name], MD  nitroGLYCERIN (NITROSTAT) 0.4 MG SL tablet DISSOLVE 1 TABLET UNDER THE TONGUE EVERY 5 MINUTES AS NEEDED FOR CHEST PAIN Patient taking differently: Place 0.4 mg under the tongue every 5 (five) minutes as needed for chest pain. 07/28/16   [name], MD  ofloxacin (FLOXIN) 0.3 % OTIC solution  03/25/21   [provider]  ondansetron (ZOFRAN) 4 MG tablet Take 1 tablet (4 mg total) by mouth every 8 (eight) hours as needed for nausea or vomiting. 11/10/20   [name], MD  senna-docusate (SENOKOT-S) 8.6-50 MG tablet Take 1 tablet by mouth at bedtime. 11/05/20   Florencia Reasons, MD  sulfamethoxazole-trimethoprim (BACTRIM DS) 800-160 MG tablet  03/25/21   [provider]  valsartan (DIOVAN) 40 MG tablet Take 0.5 tablets (20 mg total) by mouth daily. Patient not taking: No sig reported 12/23/20   [name], MD  vitamin C (ASCORBIC ACID) 500 MG tablet Take 1,000 mg by mouth daily as needed.    [provider]   Allergies  Allergen Reactions   Crestor [Rosuvastatin]     Other reaction(s): muscle pain 2.16.2022 Pt is current on this medication without any issues.   Dulaglutide     Other reaction(s): upset stomach   Empagliflozin     Other reaction(s): yeast infection   Gabapentin Other (See Comments)    At high doses - BLURRED VISION, BALLANCE, CONCENTRATION PROBLEMS, RESTLESSNESS, NOT SLEEPING, ? LOST OF STRENGTH, UNSTEADINESS   Lisinopril Cough    tachycardia   Nexium [Esomeprazole] Other (See Comments)    Headache   Niacin And Related Other (See Comments)    flushing   Oxycodone Other (See Comments)    Even 1/2 tablet of 5mg  caused increased confusion - contributory to hospitalization 10/2020 with AMS   Pregabalin     Other reaction(s): MALAISE, SLIGHT NAUSEA, SLIGHT HEADACHE   Review  of Systems  All other systems reviewed and are negative.  Physical Exam Pulmonary:     Effort: Pulmonary effort is normal.  Neurological:     Mental Status: He is alert.    Vital Signs: BP 106/63 (BP Location: Right Arm)   Pulse 68   Temp 98.3 F (36.8 C)   Resp 16   Ht 5\' 8"  (1.727 m)   Wt 83.9 kg   SpO2 100%   BMI 28.13 kg/m  Pain Scale: 0-10 POSS *See Group Information*: 1-Acceptable,Awake and alert Pain Score: 0-No pain   SpO2: SpO2: 100 % O2 Device:SpO2: 100 % O2 Flow Rate: .   IO: Intake/output summary:  Intake/Output Summary (Last 24 hours) at 04/04/2021 1243 Last data filed at 04/04/2021 1016 Gross per 24 hour  Intake 120 ml  Output 800 ml  Net -680 ml    LBM: Last BM Date: 04/03/21 Baseline Weight: Weight: 83.9 kg Most recent weight: Weight: 83.9 kg      Time In: 9:00 Time Out: 9:30 Time Total: 30 min Greater than 50%  of this time  was spent counseling and coordinating care related to the above assessment and plan.  Signed by: Asencion Gowda, NP   Please contact Palliative Medicine Team phone at [phone] for questions and concerns.  For individual provider: See Shea Evans

## 2021-04-05 DIAGNOSIS — Z9989 Dependence on other enabling machines and devices: Secondary | ICD-10-CM

## 2021-04-05 DIAGNOSIS — E1140 Type 2 diabetes mellitus with diabetic neuropathy, unspecified: Secondary | ICD-10-CM

## 2021-04-05 DIAGNOSIS — G2581 Restless legs syndrome: Secondary | ICD-10-CM

## 2021-04-05 DIAGNOSIS — E1165 Type 2 diabetes mellitus with hyperglycemia: Secondary | ICD-10-CM

## 2021-04-05 DIAGNOSIS — G4733 Obstructive sleep apnea (adult) (pediatric): Secondary | ICD-10-CM

## 2021-04-05 LAB — RESP PANEL BY RT-PCR (FLU A&B, COVID) ARPGX2
Influenza A by PCR: NEGATIVE
Influenza B by PCR: NEGATIVE
SARS Coronavirus 2 by RT PCR: NEGATIVE

## 2021-04-05 LAB — GLUCOSE, CAPILLARY
Glucose-Capillary: 53 mg/dL — ABNORMAL LOW (ref 70–99)
Glucose-Capillary: 154 mg/dL — ABNORMAL HIGH (ref 70–99)
Glucose-Capillary: 224 mg/dL — ABNORMAL HIGH (ref 70–99)
Glucose-Capillary: 197 mg/dL — ABNORMAL HIGH (ref 70–99)
Glucose-Capillary: 230 mg/dL — ABNORMAL HIGH (ref 70–99)

## 2021-04-05 MED ORDER — DOCUSATE SODIUM 100 MG PO CAPS: 100.0000 mg | ORAL_CAPSULE | Freq: Two times a day (BID) | ORAL | 0 refills | Status: DC | PRN

## 2021-04-05 MED ORDER — POLYETHYLENE GLYCOL 3350 17 G PO PACK: 17.0000 g | PACK | Freq: Every day | ORAL | 0 refills | Status: DC | PRN

## 2021-04-05 MED ORDER — FAMOTIDINE 20 MG PO TABS: 20.0000 mg | ORAL_TABLET | Freq: Every day | ORAL | Status: DC

## 2021-04-05 MED ORDER — INSULIN ASPART 100 UNIT/ML IJ SOLN: INTRAMUSCULAR | 11 refills | Status: DC

## 2021-04-05 MED ORDER — INSULIN GLARGINE 100 UNIT/ML ~~LOC~~ SOLN
40.0000 [IU] | Freq: Every day | SUBCUTANEOUS | 11 refills | Status: DC
Start: 2021-04-05 — End: 2021-11-02

## 2021-04-05 MED ORDER — INSULIN GLARGINE 100 UNIT/ML ~~LOC~~ SOLN
40.0000 [IU] | Freq: Every day | SUBCUTANEOUS | Status: DC
  Administered 2021-04-05: 40 [IU] via SUBCUTANEOUS
  Filled 2021-04-05 (×2): qty 0.4

## 2021-04-05 NOTE — Progress Notes (Signed)
Inpatient Diabetes Program Recommendations  AACE/ADA: New Consensus Statement on Inpatient Glycemic Control   Target Ranges:  Prepandial:   less than 140 mg/dL      Peak postprandial:   less than 180 mg/dL (1-2 hours)      Critically ill patients:  140 - 180 mg/dL   Results for Staheli, Marcheta Grammes, MD "DR. [place]" (MRN [id number]) as of 04/05/2021 10:09  Ref. Range 04/04/2021 08:09 04/04/2021 11:47 04/04/2021 16:29 04/04/2021 21:17 04/05/2021 08:10 04/05/2021 09:10  Glucose-Capillary Latest Ref Range: 70 - 99 mg/dL 97 199 (H) 193 (H) 152 (H) 53 (L) 154 (H)    Review of Glycemic Control  Diabetes history: DM2 Outpatient Diabetes medications: Tresiba 90 units QHS, Novolog 20-26 units BID, Ozempic 1 mg Qweek Current orders for Inpatient glycemic control: Lantus 50 units QHS, Novolog 0-9 units TID with meals, Novolog 0-5 units QHS, Ozempic 1 mg Qweek  Inpatient Diabetes Program Recommendations:    Insulin: Fasting glucose 53 mg/dl today. Please consider decreasing Lantus to 45 units QHS and discontinuing Ozempic (not on formulary) while inpatient.  Thanks, Barnie Alderman, RN, MSN, CDE Diabetes Coordinator Inpatient Diabetes Program [phone] (Team Pager from 8am to 5pm)

## 2021-04-05 NOTE — Progress Notes (Addendum)
Hypoglycemic Event  CBG: 53  Treatment: 8 Oz orange juice given PO  Symptoms: none  Follow-up CBG: Time:0830 CBG Result:154  Possible Reasons for Event: inadequate meal intake.  Comments/MD notified:Dr Akula notified per protocol of Hypoglycemic event and Hypoglycemic algorithm initiated.    Levander Katzenstein J Jareth Pardee

## 2021-04-05 NOTE — Discharge Summary (Addendum)
Physician Discharge Summary  James Spurr, MD JKD:[id number] DOB: Sep 15, 1931 DOA: 03/31/2021  PCP: [name], MD  Admit date: 03/31/2021 Discharge date: 04/06/2021  Admitted From: ALF Disposition:  SNF  Recommendations for Outpatient Follow-up:  Follow up with PCP in 1-2 weeks Please obtain BMP/CBC in one week   Discharge Condition: stable.  CODE STATUS:full code.  Diet recommendation: Heart Healthy / Carb Modified   Brief/Interim Summary: 87 year old gentleman with prior history of migraine, hypertension, obstructive sleep apnea on CPAP, type 2 diabetes mellitus stage IIIa CKD, dyslipidemia, see history of CVA with right-sided hemiparesis, GERD, restless leg syndrome presents to ED with acute onset of generalized weakness and multiple falls.  On arrival to ED he was found to have a UTI.  Imaging of pelvis and hips did not show any acute abnormalities.  Chest x-ray is negative for acute cardiopulmonary disease.  He was admitted for evaluation and treatment of dehydration and urinary tract infection. Pt seen and examined, no new complaints at this time  Discharge Diagnoses:  Active Problems:   History of CVA (cerebrovascular accident)   Type 2 diabetes mellitus, uncontrolled, with neuropathy (HCC)   OSA on CPAP   CKD stage 3 secondary to diabetes ([place])   Hyperlipidemia associated with type 2 diabetes mellitus (HCC)   Restless leg syndrome   GERD (gastroesophageal reflux disease)   Migraines   HTN (hypertension)   UTI (urinary tract infection)   Generalized weakness, multiple falls, urinary tract infection Patient was started on IV Rocephin and IV fluids. Urine culture show insignificant growth, complete 3 days of IV Rocephin and discontinue the antibiotics.  Therapy evaluations ordered for deconditioning and generalized weakness recommending SNF.     AKI superimposed on stage IIIa CKD Baseline creatinine around 1.3.  He was admitted with a creatinine of  1.5. Gently hydrated and repeat renal parameters are back to baseline. Creatinine around 1.29.       Obstructive sleep apnea Continue with CPAP at night.     Type 2 diabetes mellitus Uncontrolled with neuropathy Decrease the dose of lantus from 50 units to 40 units . Continue with sliding scale insulin and gabapentin for peripheral neuropathy. Discussed about decreasing the dose of gabapentin , but he is adamant about keeping the same dose,he reports having severe RLS . Hemoglobin A1c is 8.5%. Holding ozempic and changed the SSI on discharge.      Essential hypertension Blood pressure parameters are well controlled.      Mild abdominal pain, constipation Bowel regimen ordered an abdominal films to evaluate for obstruction. Abd x rays are negative for obstruction or constipation. CT abd and pelvis without contrast to evaluate for left lower quadrant pain. Which was negative for acute pathology. Pain control.      Mild thrombocytopenia No signs of bleeding at this time.    GERD Stable continue with PPI   Addendum Pt was dc'd yesterday but going to SNF today, no overnight issues, c/o incomplete bladder emptying, will do bladder scan, if retaining urine then we will insert foley to keep for 1 wk and f/u urology out pt for voiding trial. Stable to d/c to SNF today.   Discharge Instructions  Discharge Instructions     Diet - low sodium heart healthy   Complete by: As directed    Discharge instructions   Complete by: As directed    Please follow up with PCP in one week.      Allergies as of 04/05/2021  Reactions   Crestor [rosuvastatin]    Other reaction(s): muscle pain 2.16.2022 Pt is current on this medication without any issues.   Dulaglutide    Other reaction(s): upset stomach   Empagliflozin    Other reaction(s): yeast infection   Gabapentin Other (See Comments)   At high doses - BLURRED VISION, BALLANCE, CONCENTRATION PROBLEMS, RESTLESSNESS, NOT  SLEEPING, ? LOST OF STRENGTH, UNSTEADINESS   Lisinopril Cough   tachycardia   Nexium [esomeprazole] Other (See Comments)   Headache   Niacin And Related Other (See Comments)   flushing   Oxycodone Other (See Comments)   Even 1/2 tablet of 5mg  caused increased confusion - contributory to hospitalization 10/2020 with AMS   Pregabalin    Other reaction(s): MALAISE, SLIGHT NAUSEA, SLIGHT HEADACHE        Medication List     STOP taking these medications    amoxicillin-clavulanate 875-125 MG tablet Commonly known as: Augmentin   cephALEXin 500 MG capsule Commonly known as: KEFLEX   insulin lispro 100 UNIT/ML KwikPen Commonly known as: HUMALOG   Jardiance 10 MG Tabs tablet Generic drug: empagliflozin   LORazepam 0.5 MG tablet Commonly known as: ATIVAN   metoprolol tartrate 25 MG tablet Commonly known as: LOPRESSOR   NovoLOG FlexPen 100 UNIT/ML FlexPen Generic drug: insulin aspart Replaced by: insulin aspart 100 UNIT/ML injection   Ozempic (1 MG/DOSE) 2 MG/1.5ML Sopn Generic drug: Semaglutide (1 MG/DOSE)   sulfamethoxazole-trimethoprim 800-160 MG tablet Commonly known as: BACTRIM DS   Tresiba FlexTouch 200 UNIT/ML FlexTouch Pen Generic drug: insulin degludec Replaced by: insulin glargine 100 UNIT/ML injection   valsartan 40 MG tablet Commonly known as: DIOVAN       TAKE these medications    Alpha-Lipoic Acid 600 MG Caps Take 1 capsule (600 mg total) by mouth daily. What changed: when to take this   BD Pen Needle Nano 2nd Gen 32G X 4 MM Misc Generic drug: Insulin Pen Needle USE TO INJECT MEDICATION DAILY   brimonidine 0.2 % ophthalmic solution Commonly known as: ALPHAGAN   clopidogrel 75 MG tablet Commonly known as: PLAVIX TAKE 1 TABLET DAILY   Combigan 0.2-0.5 % ophthalmic solution Generic drug: brimonidine-timolol   COQ10 150 MG Caps Take 1 capsule by mouth daily.   docusate sodium 100 MG capsule Commonly known as: COLACE Take 1 capsule (100  mg total) by mouth 2 (two) times daily as needed for mild constipation.   dorzolamide-timolol 22.3-6.8 MG/ML ophthalmic solution Commonly known as: COSOPT   DULoxetine 60 MG capsule Commonly known as: CYMBALTA TAKE 1 CAPSULE DAILY   famotidine 20 MG tablet Commonly known as: Pepcid Take 1 tablet (20 mg total) by mouth at bedtime. What changed:  when to take this additional instructions   Fish Oil 1000 MG Caps Take 1 capsule (1,000 mg total) by mouth in the morning and at bedtime.   Fluocinolone Acetonide 0.01 % Oil   gabapentin 300 MG capsule Commonly known as: NEURONTIN Take 600 mg by mouth 3 (three) times daily. 600 mg BID is his minimum dose without having breakthrough symptoms. He is aware 900 mg/day is recommended per kidney function.   insulin aspart 100 UNIT/ML injection Commonly known as: novoLOG CBG 70 - 120: 0 units  CBG 121 - 150: 1 unit  CBG 151 - 200: 2 units  CBG 201 - 250: 3 units  CBG 251 - 300: 5 units  CBG 301 - 350: 7 units  CBG 351 - 400: 9 units Replaces: NovoLOG FlexPen 100 UNIT/ML  FlexPen   insulin glargine 100 UNIT/ML injection Commonly known as: LANTUS Inject 0.4 mLs (40 Units total) into the skin at bedtime. Replaces: Tyler Aas FlexTouch 200 UNIT/ML FlexTouch Pen   ipratropium 0.03 % nasal spray Commonly known as: ATROVENT   l-methylfolate-B6-B12 3-35-2 MG Tabs tablet Commonly known as: METANX Take 1 tablet by mouth daily.   levothyroxine 50 MCG tablet Commonly known as: SYNTHROID Take 1 tablet (50 mcg total) by mouth daily before breakfast. Per endo [name]   Lumigan 0.01 % Soln Generic drug: bimatoprost Place 1 drop into the right eye at bedtime.   melatonin 5 MG Tabs Take 5 mg by mouth at bedtime.   nitroGLYCERIN 0.4 MG SL tablet Commonly known as: Nitrostat DISSOLVE 1 TABLET UNDER THE TONGUE EVERY 5 MINUTES AS NEEDED FOR CHEST PAIN What changed:  how much to take how to take this when to take this reasons to take  this additional instructions   ofloxacin 0.3 % OTIC solution Commonly known as: FLOXIN   ondansetron 4 MG tablet Commonly known as: Zofran Take 1 tablet (4 mg total) by mouth every 8 (eight) hours as needed for nausea or vomiting.   polyethylene glycol 17 g packet Commonly known as: MIRALAX / GLYCOLAX Take 17 g by mouth daily as needed.   pramipexole 1 MG tablet Commonly known as: MIRAPEX Take 1 tablet (1 mg total) by mouth See admin instructions. At 4pm   RABEprazole 20 MG tablet Commonly known as: ACIPHEX TAKE 1 TABLET DAILY   rosuvastatin 40 MG tablet Commonly known as: Crestor Take 1 tablet (40 mg total) by mouth daily.   senna-docusate 8.6-50 MG tablet Commonly known as: Senokot-S Take 1 tablet by mouth at bedtime.   tamsulosin 0.4 MG Caps capsule Commonly known as: FLOMAX Take 1 capsule (0.4 mg total) by mouth daily after breakfast.   vitamin C 500 MG tablet Commonly known as: ASCORBIC ACID Take 1,000 mg by mouth daily as needed.        Contact information for follow-up providers     [name], MD. Schedule an appointment as soon as possible for a visit in 1 week(s).   Specialty: Family Medicine Contact information: [place] [address] [phone]              Contact information for after-discharge care     Destination     [facility] Preferred SNF .   Service: Skilled Nursing Contact information: [place] [phone]                    Allergies  Allergen Reactions   Crestor [Rosuvastatin]     Other reaction(s): muscle pain 2.16.2022 Pt is current on this medication without any issues.   Dulaglutide     Other reaction(s): upset stomach   Empagliflozin     Other reaction(s): yeast infection   Gabapentin Other (See Comments)    At high doses - BLURRED VISION, BALLANCE, CONCENTRATION PROBLEMS, RESTLESSNESS, NOT SLEEPING, ? LOST OF  STRENGTH, UNSTEADINESS   Lisinopril Cough    tachycardia   Nexium [Esomeprazole] Other (See Comments)    Headache   Niacin And Related Other (See Comments)    flushing   Oxycodone Other (See Comments)    Even 1/2 tablet of 5mg  caused increased confusion - contributory to hospitalization 10/2020 with AMS   Pregabalin     Other reaction(s): MALAISE, SLIGHT NAUSEA, SLIGHT HEADACHE    Consultations:  Palliative care    Procedures/Studies: CT ABDOMEN WO CONTRAST  Result Date: 04/02/2021 CLINICAL DATA:  Nausea vomiting and left upper quadrant abdominal pain. EXAM: CT ABDOMEN WITHOUT CONTRAST TECHNIQUE: Multidetector CT imaging of the abdomen was performed following the standard protocol without IV contrast. COMPARISON:  July 01, 2020 FINDINGS: Lower chest: No acute abnormality. Hepatobiliary: No focal liver abnormality is seen. No gallstones, gallbladder wall thickening, or biliary dilatation. Pancreas: Mostly fatty replaced. Spleen: Normal in size without focal abnormality. Adrenals/Urinary Tract: Normal adrenal glands. No evidence of obstructive uropathy or renal calculi. Bilateral renal cysts. Stomach/Bowel: Stomach is within normal limits. No evidence of bowel wall thickening, distention, or inflammatory changes, where visualized. Vascular/Lymphatic: Aortic atherosclerosis. No enlarged abdominal lymph nodes. 1.3 cm rim calcified splenic artery aneurysm, stable from 2021. Other: No abdominal wall hernia or abnormality. Musculoskeletal: Diffuse spondylosis of the thoracic spine. IMPRESSION: 1. No evidence of obstructive uropathy or renal calculi. 2. Bilateral renal cysts. 3. Stable 1.3 cm rim calcified splenic artery aneurysm. 4. Aortic atherosclerosis. Aortic Atherosclerosis (ICD10-I70.0). Electronically Signed   By: Fidela Salisbury M.D.   On: 04/02/2021 13:23   DG Chest 1 View  Result Date: 03/31/2021 CLINICAL DATA:  87 year old male status post falls. Pain. EXAM: CHEST  1 VIEW COMPARISON:   Portable chest 11/02/2020 and earlier. FINDINGS: Supine AP views at 0426 hours. Lower lung volumes. Mediastinal contours are stable and within normal limits. Visualized tracheal air column is within normal limits. Allowing for portable technique the lungs are clear. Mild apical pleural thickening is stable. No pneumothorax or pleural effusion evident on these supine views. Stable visualized osseous structures. IMPRESSION: Lower lung volumes. No acute cardiopulmonary abnormality or acute traumatic injury identified. Electronically Signed   By: Genevie Ann M.D.   On: 03/31/2021 05:30   DG Shoulder Right  Result Date: 03/31/2021 CLINICAL DATA:  87 year old male status post falls. Pain. EXAM: RIGHT SHOULDER - 2+ VIEW COMPARISON:  Chest radiographs 03/18/2015. FINDINGS: Bone mineralization is within normal limits for age. There is no evidence of fracture or dislocation. Normal glenohumeral alignment. Proximal right humerus appears intact. Mild for age degenerative spurring at the right [facility] joint. Visible right ribs appear intact. IMPRESSION: No acute fracture or dislocation identified about the right shoulder. Electronically Signed   By: Genevie Ann M.D.   On: 03/31/2021 05:29   DG Elbow Complete Right  Result Date: 03/31/2021 CLINICAL DATA:  87 year old male status post falls. Pain. EXAM: RIGHT ELBOW - COMPLETE 3+ VIEW COMPARISON:  None. FINDINGS: Lateral view is slightly oblique. Bone mineralization is within normal limits for age. There is no evidence of fracture, dislocation, or joint effusion. No significant degenerative changes. No discrete soft tissue injury. IMPRESSION: No acute fracture or dislocation identified about the right elbow. Electronically Signed   By: Genevie Ann M.D.   On: 03/31/2021 05:27   CT Head Wo Contrast  Result Date: 03/31/2021 CLINICAL DATA:  87 year old male status post falls, collapse. EXAM: CT HEAD WITHOUT CONTRAST TECHNIQUE: Contiguous axial images were obtained from the base of the  skull through the vertex without intravenous contrast. COMPARISON:  Brain MRI 11/02/2020.  Head CT 03/22/2021. FINDINGS: Brain: Advanced chronic small vessel disease in the left corona radiata and lentiform appears stable. Stable gray-white matter differentiation elsewhere. No midline shift, ventriculomegaly, mass effect, evidence of mass lesion, intracranial hemorrhage or evidence of cortically based acute infarction. Vascular: Calcified atherosclerosis at the skull base. No suspicious intracranial vascular hyperdensity. Skull: Stable, intact. Small imbedded metallic foreign body at the  vertex is stable (series 2, image 75). Sinuses/Orbits: Visualized paranasal sinuses and mastoids are stable and well aerated. Other: No orbit or scalp soft tissue injury identified. IMPRESSION: 1. No acute intracranial abnormality or acute traumatic injury identified. 2. Chronic small vessel disease in the left corona radiata and lentiform. Electronically Signed   By: Genevie Ann M.D.   On: 03/31/2021 04:56   CT Head Wo Contrast  Result Date: 03/22/2021 CLINICAL DATA:  Head trauma. Fall 2 weeks ago, hitting head on concrete. Headaches. Brain fog. EXAM: CT HEAD WITHOUT CONTRAST TECHNIQUE: Contiguous axial images were obtained from the base of the skull through the vertex without intravenous contrast. COMPARISON:  Head CT and MRI 11/02/2020 FINDINGS: Brain: There is no evidence of an acute infarct, intracranial hemorrhage, mass, midline shift, or extra-axial fluid collection. Hypodensities in the cerebral white matter bilaterally are unchanged and nonspecific but compatible with mild chronic small vessel ischemic disease. A chronic infarct is again noted involving the left basal ganglia and deep white matter with ex vacuo dilatation of the left lateral ventricle. There is mild cerebral atrophy. Vascular: Calcified atherosclerosis at the skull base. No hyperdense vessel. Skull: No fracture or suspicious osseous lesion. Sinuses/Orbits:  Visualized paranasal sinuses and mastoid air cells are clear. Bilateral cataract extraction. Other: None. IMPRESSION: 1. No evidence of acute intracranial abnormality. 2. Mild chronic small vessel ischemic disease including a chronic left basal ganglia infarct. Electronically Signed   By: Logan Bores M.D.   On: 03/22/2021 16:15   CT Cervical Spine Wo Contrast  Result Date: 03/31/2021 CLINICAL DATA:  87 year old male status post falls, collapse. EXAM: CT CERVICAL SPINE WITHOUT CONTRAST TECHNIQUE: Multidetector CT imaging of the cervical spine was performed without intravenous contrast. Multiplanar CT image reconstructions were also generated. COMPARISON:  Cervical spine CT 07/01/2020. Head CT today reported separately. FINDINGS: Alignment: Stable, mildly exaggerated cervical lordosis. Cervicothoracic junction alignment is within normal limits. Posterior element alignment is stable. Skull base and vertebrae: Visualized skull base is intact. No atlanto-occipital dissociation. Stable bone mineralization. C1 and C2 appear stable and intact. No acute osseous abnormality identified. Chronic interbody ankylosis C6 through T1 from flowing anterior endplate osteophytes. Soft tissues and spinal canal: No prevertebral fluid or swelling. No visible canal hematoma. Negative noncontrast neck soft tissues. Disc levels: Stable cervical spine degeneration. Fairly capacious cervical spinal canal. Upper chest: Stable visible upper chest. IMPRESSION: 1. No acute traumatic injury identified in the cervical spine. 2. Chronic cervical spine degeneration. Electronically Signed   By: Genevie Ann M.D.   On: 03/31/2021 04:59   DG Knee Complete 4 Views Right  Result Date: 03/31/2021 CLINICAL DATA:  87 year old male status post falls. Pain. EXAM: RIGHT KNEE - COMPLETE 4+ VIEW COMPARISON:  None. FINDINGS: Bone mineralization is within normal limits for age. No evidence of fracture, dislocation, or joint effusion. No significant degenerative  changes. No discrete soft tissue injury. IMPRESSION: No acute fracture or dislocation identified about the right knee. Electronically Signed   By: Genevie Ann M.D.   On: 03/31/2021 05:32   DG Abd 2 Views  Result Date: 04/01/2021 CLINICAL DATA:  Hypertension. EXAM: ABDOMEN - 2 VIEW COMPARISON:  None. FINDINGS: The bowel gas pattern is normal. There is no evidence of free air. No radio-opaque calculi or other significant radiographic abnormality is seen. IMPRESSION: Negative. Electronically Signed   By: Misty Stanley M.D.   On: 04/01/2021 12:50   DG Hip Unilat W or Wo Pelvis 2-3 Views Right  Result Date: 03/31/2021 CLINICAL DATA:  87 year old male status post falls.  Pain. EXAM: DG HIP (WITH OR WITHOUT PELVIS) 2-3V RIGHT COMPARISON:  CT Abdomen and Pelvis 07/01/2020. FINDINGS: Bone mineralization is within normal limits for age. There is no evidence of hip fracture or dislocation. Pelvis appears intact. Grossly intact proximal left femur. Incidental scrotal surgical clips. IMPRESSION: No acute fracture or dislocation identified about the right hip or pelvis. Electronically Signed   By: Genevie Ann M.D.   On: 03/31/2021 05:26      Subjective: No new complaints.   Discharge Exam: Vitals:   04/05/21 0808 04/05/21 1217  BP: (!) 148/77 131/64  Pulse: 78 80  Resp: 16 18  Temp: 97.6 F (36.4 C) 97.9 F (36.6 C)  SpO2: 100% 98%   Vitals:   04/04/21 1947 04/05/21 0458 04/05/21 0808 04/05/21 1217  BP: 114/60 130/74 (!) 148/77 131/64  Pulse: 64 72 78 80  Resp: 18 20 16 18   Temp: 97.6 F (36.4 C) 97.6 F (36.4 C) 97.6 F (36.4 C) 97.9 F (36.6 C)  TempSrc:      SpO2: 96% 97% 100% 98%  Weight:      Height:        General: Pt is alert, awake, not in acute distress Cardiovascular: RRR, S1/S2 +, no rubs, no gallops Respiratory: CTA bilaterally, no wheezing, no rhonchi Abdominal: Soft, NT, ND, bowel sounds + Extremities: no edema, no cyanosis    The results of significant diagnostics from this  hospitalization (including imaging, microbiology, ancillary and laboratory) are listed below for reference.     Microbiology: Recent Results (from the past 240 hour(s))  Resp Panel by RT-PCR (Flu A&B, Covid) Nasopharyngeal Swab     Status: None   Collection Time: 03/31/21  3:48 AM   Specimen: Nasopharyngeal Swab; Nasopharyngeal(NP) swabs in vial transport medium  Result Value Ref Range Status   SARS Coronavirus 2 by RT PCR NEGATIVE NEGATIVE Final    Comment: (NOTE) SARS-CoV-2 target nucleic acids are NOT DETECTED.  The SARS-CoV-2 RNA is generally detectable in upper respiratory specimens during the acute phase of infection. The lowest concentration of SARS-CoV-2 viral copies this assay can detect is 138 copies/mL. A negative result does not preclude SARS-Cov-2 infection and should not be used as the sole basis for treatment or other patient management decisions. A negative result may occur with  improper specimen collection/handling, submission of specimen other than nasopharyngeal swab, presence of viral mutation(s) within the areas targeted by this assay, and inadequate number of viral copies(<138 copies/mL). A negative result must be combined with clinical observations, patient history, and epidemiological information. The expected result is Negative.  Fact Sheet for Patients:  EntrepreneurPulse.com.au  Fact Sheet for Healthcare Providers:  IncredibleEmployment.be  This test is no t yet approved or cleared by the [place] FDA and  has been authorized for detection and/or diagnosis of SARS-CoV-2 by FDA under an Emergency Use Authorization (EUA). This EUA will remain  in effect (meaning this test can be used) for the duration of the COVID-19 declaration under Section 564(b)(1) of the Act, 21 U.S.C.section 360bbb-3(b)(1), unless the authorization is terminated  or revoked sooner.       Influenza A by PCR NEGATIVE NEGATIVE Final    Influenza B by PCR NEGATIVE NEGATIVE Final    Comment: (NOTE) The Xpert Xpress SARS-CoV-2/FLU/RSV plus assay is intended as an aid in the diagnosis of influenza from Nasopharyngeal swab specimens and should not be used as a sole basis for treatment. Nasal washings and aspirates are unacceptable  for Xpert Xpress SARS-CoV-2/FLU/RSV testing.  Fact Sheet for Patients: EntrepreneurPulse.com.au  Fact Sheet for Healthcare Providers: IncredibleEmployment.be  This test is not yet approved or cleared by the [place] FDA and has been authorized for detection and/or diagnosis of SARS-CoV-2 by FDA under an Emergency Use Authorization (EUA). This EUA will remain in effect (meaning this test can be used) for the duration of the COVID-19 declaration under Section 564(b)(1) of the Act, 21 U.S.C. section 360bbb-3(b)(1), unless the authorization is terminated or revoked.  Performed at [facility], [place], [place] [address]   SARS CORONAVIRUS 2 (TAT 6-24 HRS) Nasopharyngeal Nasopharyngeal Swab     Status: None   Collection Time: 03/31/21 10:38 PM   Specimen: Nasopharyngeal Swab  Result Value Ref Range Status   SARS Coronavirus 2 NEGATIVE NEGATIVE Final    Comment: (NOTE) SARS-CoV-2 target nucleic acids are NOT DETECTED.  The SARS-CoV-2 RNA is generally detectable in upper and lower respiratory specimens during the acute phase of infection. Negative results do not preclude SARS-CoV-2 infection, do not rule out co-infections with other pathogens, and should not be used as the sole basis for treatment or other patient management decisions. Negative results must be combined with clinical observations, patient history, and epidemiological information. The expected result is Negative.  Fact Sheet for Patients: SugarRoll.be  Fact Sheet for Healthcare  Providers: https://www.woods-mathews.com/  This test is not yet approved or cleared by the [place] FDA and  has been authorized for detection and/or diagnosis of SARS-CoV-2 by FDA under an Emergency Use Authorization (EUA). This EUA will remain  in effect (meaning this test can be used) for the duration of the COVID-19 declaration under Se ction 564(b)(1) of the Act, 21 U.S.C. section 360bbb-3(b)(1), unless the authorization is terminated or revoked sooner.  Performed at [facility], [place] [address] [place], [place] [address]   Urine Culture     Status: Abnormal   Collection Time: 04/01/21 12:06 AM   Specimen: Urine, Random  Result Value Ref Range Status   Specimen Description   Final    URINE, RANDOM Performed at [facility], [address] [place], [place] [address]    Special Requests   Final    NONE Performed at [facility], [place] [place], [place] [address]    Culture (A)  Final    <10,000 COLONIES/mL INSIGNIFICANT GROWTH Performed at [facility], [place] [address] [place],  [address]    Report Status 04/02/2021 FINAL  Final     Labs: BNP (last 3 results) No results for input(s): BNP in the last 8760 hours. Basic Metabolic Panel: Recent Labs  Lab 03/31/21 0348 03/31/21 2238 04/01/21 0500 04/02/21 1043 04/03/21 0445  NA 135 137 139 136 137  K 4.1 3.7 3.6 3.6 3.9  CL 103 101 108 106 106  CO2 25 27 27 24 25   GLUCOSE 220* 161* 74 134* 75  BUN 28* 25* 23 23 26*  CREATININE 1.52* 1.53* 1.37* 1.31* 1.29*  CALCIUM 8.8* 8.7* 8.3* 7.9* 8.0*   Liver Function Tests: No results for input(s): AST, ALT, ALKPHOS, BILITOT, PROT, ALBUMIN in the last 168 hours. No results for input(s): LIPASE, AMYLASE in the last 168 hours. No results for input(s): AMMONIA in the last 168 hours. CBC: Recent Labs  Lab 03/31/21 0348 03/31/21 2238 04/01/21 0500 04/03/21 0445  WBC 15.4* 14.6* 12.4* 5.0  NEUTROABS 12.9*  11.6*  --  3.4  HGB 14.4 14.4 13.3  13.0  HCT 43.6 44.1 38.8* 39.5  MCV 90.8 91.1 88.8 91.4  PLT 162 148* 139* 146*   Cardiac Enzymes: No results for input(s): CKTOTAL, CKMB, CKMBINDEX, TROPONINI in the last 168 hours. BNP: Invalid input(s): POCBNP CBG: Recent Labs  Lab 04/04/21 1629 04/04/21 2117 04/05/21 0810 04/05/21 0910 04/05/21 1226  GLUCAP 193* 152* 53* 154* 224*   D-Dimer No results for input(s): DDIMER in the last 72 hours. Hgb A1c No results for input(s): HGBA1C in the last 72 hours. Lipid Profile No results for input(s): CHOL, HDL, LDLCALC, TRIG, CHOLHDL, LDLDIRECT in the last 72 hours. Thyroid function studies No results for input(s): TSH, T4TOTAL, T3FREE, THYROIDAB in the last 72 hours.  Invalid input(s): FREET3 Anemia work up No results for input(s): VITAMINB12, FOLATE, FERRITIN, TIBC, IRON, RETICCTPCT in the last 72 hours. Urinalysis    Component Value Date/Time   COLORURINE YELLOW (A) 04/01/2021 0006   APPEARANCEUR CLOUDY (A) 04/01/2021 0006   LABSPEC 1.015 04/01/2021 0006   PHURINE 6.0 04/01/2021 0006   GLUCOSEU 150 (A) 04/01/2021 0006   HGBUR SMALL (A) 04/01/2021 0006   BILIRUBINUR NEGATIVE 04/01/2021 0006   BILIRUBINUR negative 10/18/2020 0902   KETONESUR NEGATIVE 04/01/2021 0006   PROTEINUR 100 (A) 04/01/2021 0006   UROBILINOGEN 0.2 10/18/2020 0902   UROBILINOGEN 0.2 03/18/2015 1102   NITRITE NEGATIVE 04/01/2021 0006   LEUKOCYTESUR LARGE (A) 04/01/2021 0006   Sepsis Labs Invalid input(s): PROCALCITONIN,  WBC,  LACTICIDVEN Microbiology Recent Results (from the past 240 hour(s))  Resp Panel by RT-PCR (Flu A&B, Covid) Nasopharyngeal Swab     Status: None   Collection Time: 03/31/21  3:48 AM   Specimen: Nasopharyngeal Swab; Nasopharyngeal(NP) swabs in vial transport medium  Result Value Ref Range Status   SARS Coronavirus 2 by RT PCR NEGATIVE NEGATIVE Final    Comment: (NOTE) SARS-CoV-2 target nucleic acids are NOT DETECTED.  The SARS-CoV-2  RNA is generally detectable in upper respiratory specimens during the acute phase of infection. The lowest concentration of SARS-CoV-2 viral copies this assay can detect is 138 copies/mL. A negative result does not preclude SARS-Cov-2 infection and should not be used as the sole basis for treatment or other patient management decisions. A negative result may occur with  improper specimen collection/handling, submission of specimen other than nasopharyngeal swab, presence of viral mutation(s) within the areas targeted by this assay, and inadequate number of viral copies(<138 copies/mL). A negative result must be combined with clinical observations, patient history, and epidemiological information. The expected result is Negative.  Fact Sheet for Patients:  EntrepreneurPulse.com.au  Fact Sheet for Healthcare Providers:  IncredibleEmployment.be  This test is no t yet approved or cleared by the [place] FDA and  has been authorized for detection and/or diagnosis of SARS-CoV-2 by FDA under an Emergency Use Authorization (EUA). This EUA will remain  in effect (meaning this test can be used) for the duration of the COVID-19 declaration under Section 564(b)(1) of the Act, 21 U.S.C.section 360bbb-3(b)(1), unless the authorization is terminated  or revoked sooner.       Influenza A by PCR NEGATIVE NEGATIVE Final   Influenza B by PCR NEGATIVE NEGATIVE Final    Comment: (NOTE) The Xpert Xpress SARS-CoV-2/FLU/RSV plus assay is intended as an aid in the diagnosis of influenza from Nasopharyngeal swab specimens and should not be used as a sole basis for treatment. Nasal washings and aspirates are unacceptable for Xpert Xpress SARS-CoV-2/FLU/RSV testing.  Fact Sheet for Patients: EntrepreneurPulse.com.au  Fact Sheet for Healthcare Providers:  IncredibleEmployment.be  This test is not yet approved or cleared by the  [place] and has been authorized for detection and/or diagnosis of SARS-CoV-2 by FDA under an Emergency Use Authorization (EUA). This EUA will remain in effect (meaning this test can be used) for the duration of the COVID-19 declaration under Section 564(b)(1) of the Act, 21 U.S.C. section 360bbb-3(b)(1), unless the authorization is terminated or revoked.  Performed at [facility], [place], [place] [address]   SARS CORONAVIRUS 2 (TAT 6-24 HRS) Nasopharyngeal Nasopharyngeal Swab     Status: None   Collection Time: 03/31/21 10:38 PM   Specimen: Nasopharyngeal Swab  Result Value Ref Range Status   SARS Coronavirus 2 NEGATIVE NEGATIVE Final    Comment: (NOTE) SARS-CoV-2 target nucleic acids are NOT DETECTED.  The SARS-CoV-2 RNA is generally detectable in upper and lower respiratory specimens during the acute phase of infection. Negative results do not preclude SARS-CoV-2 infection, do not rule out co-infections with other pathogens, and should not be used as the sole basis for treatment or other patient management decisions. Negative results must be combined with clinical observations, patient history, and epidemiological information. The expected result is Negative.  Fact Sheet for Patients: SugarRoll.be  Fact Sheet for Healthcare Providers: https://www.woods-mathews.com/  This test is not yet approved or cleared by the [place] FDA and  has been authorized for detection and/or diagnosis of SARS-CoV-2 by FDA under an Emergency Use Authorization (EUA). This EUA will remain  in effect (meaning this test can be used) for the duration of the COVID-19 declaration under Se ction 564(b)(1) of the Act, 21 U.S.C. section 360bbb-3(b)(1), unless the authorization is terminated or revoked sooner.  Performed at [facility], [place] [address] [place], [place] [address]   Urine Culture     Status:  Abnormal   Collection Time: 04/01/21 12:06 AM   Specimen: Urine, Random  Result Value Ref Range Status   Specimen Description   Final    URINE, RANDOM Performed at [facility], [address] [place], [place] [address]    Special Requests   Final    NONE Performed at [facility], [address] [place], [place] [address]    Culture (A)  Final    <10,000 COLONIES/mL INSIGNIFICANT GROWTH Performed at [facility], [place] [address], [place], [place] [address]    Report Status 04/02/2021 FINAL  Final     Time coordinating discharge:38 minutes  SIGNED:   Hosie Poisson, MD  [facility] 04/05/2021, 1:39 PM

## 2021-04-05 NOTE — Care Management Important Message (Signed)
Important Message  Patient Details  Name: James PIETRZAK, MD MRN: [id number] Date of Birth: [birth year]   Medicare Important Message Given:  Yes     Juliann Pulse A Johnette Teigen 04/05/2021, 11:46 AM

## 2021-04-05 NOTE — TOC Progression Note (Signed)
Transition of Care [facility]) - Progression Note    Patient Details  Name: DELLA HOMAN, MD MRN: [id number] Date of Birth: 1931/08/10  Transition of Care [facility]) CM/SW [place], RN Phone Number: 04/05/2021, 1:47 PM  Clinical Narrative:     Received notification from [facility] with ins approval [id number], start date 7/19 next review 7/21    Barriers to Discharge: Continued Medical Work up  Expected Discharge Plan and Services           Expected Discharge Date: 04/05/21                                     Social Determinants of Health (SDOH) Interventions    Readmission Risk Interventions No flowsheet data found.

## 2021-04-05 NOTE — TOC Progression Note (Signed)
Transition of Care [facility]) - Progression Note    Patient Details  Name: IRAN ROWE, MD MRN: [id number] Date of Birth: 1931/09/17  Transition of Care [facility]) CM/SW [place], RN Phone Number: 04/05/2021, 9:16 AM  Clinical Narrative:    Received Notification from [facility] requesting updated PYT notes, Uploaded the PT notes from 7/18, awaiting approval to go to SNF     Barriers to Discharge: Continued Medical Work up  Expected Discharge Plan and Services                                                 Social Determinants of Health (SDOH) Interventions    Readmission Risk Interventions No flowsheet data found.

## 2021-04-06 DIAGNOSIS — H26233 Glaucomatous flecks (subcapsular), bilateral: Secondary | ICD-10-CM | POA: Diagnosis not present

## 2021-04-06 DIAGNOSIS — G8110 Spastic hemiplegia affecting unspecified side: Secondary | ICD-10-CM | POA: Diagnosis not present

## 2021-04-06 DIAGNOSIS — E1165 Type 2 diabetes mellitus with hyperglycemia: Secondary | ICD-10-CM | POA: Diagnosis not present

## 2021-04-06 DIAGNOSIS — N481 Balanitis: Secondary | ICD-10-CM | POA: Diagnosis not present

## 2021-04-06 DIAGNOSIS — N139 Obstructive and reflux uropathy, unspecified: Secondary | ICD-10-CM | POA: Diagnosis not present

## 2021-04-06 DIAGNOSIS — I10 Essential (primary) hypertension: Secondary | ICD-10-CM | POA: Diagnosis not present

## 2021-04-06 DIAGNOSIS — E039 Hypothyroidism, unspecified: Secondary | ICD-10-CM | POA: Diagnosis not present

## 2021-04-06 DIAGNOSIS — Z736 Limitation of activities due to disability: Secondary | ICD-10-CM | POA: Diagnosis not present

## 2021-04-06 DIAGNOSIS — N179 Acute kidney failure, unspecified: Secondary | ICD-10-CM | POA: Diagnosis not present

## 2021-04-06 DIAGNOSIS — I739 Peripheral vascular disease, unspecified: Secondary | ICD-10-CM | POA: Diagnosis not present

## 2021-04-06 DIAGNOSIS — R339 Retention of urine, unspecified: Secondary | ICD-10-CM | POA: Diagnosis not present

## 2021-04-06 DIAGNOSIS — E1140 Type 2 diabetes mellitus with diabetic neuropathy, unspecified: Secondary | ICD-10-CM | POA: Diagnosis not present

## 2021-04-06 DIAGNOSIS — N472 Paraphimosis: Secondary | ICD-10-CM | POA: Diagnosis not present

## 2021-04-06 DIAGNOSIS — R5381 Other malaise: Secondary | ICD-10-CM | POA: Diagnosis not present

## 2021-04-06 DIAGNOSIS — R269 Unspecified abnormalities of gait and mobility: Secondary | ICD-10-CM | POA: Diagnosis not present

## 2021-04-06 DIAGNOSIS — M6281 Muscle weakness (generalized): Secondary | ICD-10-CM | POA: Diagnosis not present

## 2021-04-06 DIAGNOSIS — E785 Hyperlipidemia, unspecified: Secondary | ICD-10-CM | POA: Diagnosis not present

## 2021-04-06 DIAGNOSIS — N390 Urinary tract infection, site not specified: Secondary | ICD-10-CM | POA: Diagnosis not present

## 2021-04-06 DIAGNOSIS — G2581 Restless legs syndrome: Secondary | ICD-10-CM | POA: Diagnosis not present

## 2021-04-06 DIAGNOSIS — R1312 Dysphagia, oropharyngeal phase: Secondary | ICD-10-CM | POA: Diagnosis not present

## 2021-04-06 DIAGNOSIS — N3000 Acute cystitis without hematuria: Secondary | ICD-10-CM | POA: Diagnosis not present

## 2021-04-06 DIAGNOSIS — D696 Thrombocytopenia, unspecified: Secondary | ICD-10-CM | POA: Diagnosis not present

## 2021-04-06 DIAGNOSIS — K219 Gastro-esophageal reflux disease without esophagitis: Secondary | ICD-10-CM | POA: Diagnosis not present

## 2021-04-06 DIAGNOSIS — R279 Unspecified lack of coordination: Secondary | ICD-10-CM | POA: Diagnosis not present

## 2021-04-06 DIAGNOSIS — R531 Weakness: Secondary | ICD-10-CM | POA: Diagnosis not present

## 2021-04-06 DIAGNOSIS — H409 Unspecified glaucoma: Secondary | ICD-10-CM | POA: Diagnosis not present

## 2021-04-06 DIAGNOSIS — I69351 Hemiplegia and hemiparesis following cerebral infarction affecting right dominant side: Secondary | ICD-10-CM | POA: Diagnosis not present

## 2021-04-06 DIAGNOSIS — D649 Anemia, unspecified: Secondary | ICD-10-CM | POA: Diagnosis not present

## 2021-04-06 LAB — GLUCOSE, CAPILLARY
Glucose-Capillary: 101 mg/dL — ABNORMAL HIGH (ref 70–99)
Glucose-Capillary: 183 mg/dL — ABNORMAL HIGH (ref 70–99)

## 2021-04-06 NOTE — TOC Progression Note (Signed)
Transition of Care [facility]) - Progression Note    Patient Details  Name: ANAIS KOENEN, MD MRN: [id number] Date of Birth: 1931/08/30  Transition of Care [facility]) CM/SW [place], RN Phone Number: 04/06/2021, 10:43 AM  Clinical Narrative:     Spoke with the patient's daughter Wells Guiles, notified her that transport will pick up the Patient at 1 PM to go to [facility], She plans to meet him there at 2 PM.     Barriers to Discharge: Continued Medical Work up  Expected Discharge Plan and Services           Expected Discharge Date: 04/06/21                                     Social Determinants of Health (SDOH) Interventions    Readmission Risk Interventions No flowsheet data found.

## 2021-04-06 NOTE — Progress Notes (Signed)
Bladder scan was done per order with residual >921ml.MD notified and new order to place indwelling Foley catheter.16 Fr Foley was placed using sterile procedure.Peri-care was provided before and after procedure.Foley tubing secured to right upper thigh with stat lock. Drainage bag is below bladder level on bed frame.900 mL.clear, amber in color urine was collected w/o sediment.patient educated to notify nurse if develops any bladder pain, discomfort, or spasms.Patient verbalized understanding.

## 2021-04-06 NOTE — Plan of Care (Signed)
Patient discharged per MD orders at this time.All discharge instructions,medications and education reviewed with Pt at bedside.patient expressed understanding and will comply with dc instructions.follow up appointments was also communicated to patient.no verbal c/o or any ssx of distress.Pt discharged to [facility] at [facility] for rehab/PT services per order.report was called in to Ms.Angela Schick/Admissions before transport.Pt transported by 2 EMS personnel on a stretcher.

## 2021-04-07 DIAGNOSIS — M6281 Muscle weakness (generalized): Secondary | ICD-10-CM | POA: Diagnosis not present

## 2021-04-07 DIAGNOSIS — R339 Retention of urine, unspecified: Secondary | ICD-10-CM | POA: Diagnosis not present

## 2021-04-07 DIAGNOSIS — N390 Urinary tract infection, site not specified: Secondary | ICD-10-CM | POA: Diagnosis not present

## 2021-04-07 DIAGNOSIS — K219 Gastro-esophageal reflux disease without esophagitis: Secondary | ICD-10-CM | POA: Diagnosis not present

## 2021-04-07 DIAGNOSIS — D696 Thrombocytopenia, unspecified: Secondary | ICD-10-CM | POA: Diagnosis not present

## 2021-04-07 DIAGNOSIS — E1165 Type 2 diabetes mellitus with hyperglycemia: Secondary | ICD-10-CM | POA: Diagnosis not present

## 2021-04-07 DIAGNOSIS — I10 Essential (primary) hypertension: Secondary | ICD-10-CM | POA: Diagnosis not present

## 2021-04-07 DIAGNOSIS — R531 Weakness: Secondary | ICD-10-CM | POA: Diagnosis not present

## 2021-04-07 DIAGNOSIS — N179 Acute kidney failure, unspecified: Secondary | ICD-10-CM | POA: Diagnosis not present

## 2021-04-07 NOTE — Progress Notes (Signed)
Pt already sees [facility] ([facility]

## 2021-04-08 ENCOUNTER — Telehealth: Payer: Self-pay

## 2021-04-08 NOTE — Chronic Care Management (AMB) (Addendum)
Chronic Care Management Pharmacy Assistant   Name: HATCHER FRITH, MD  MRN: [id number] DOB: 02/09/1931  Reason for Encounter: Disease State DM    Recent office visits:  03/08/21 - Dr.Gutierrez PCP  - Presents due to fall. Stay off caffeine as much as you can to better control restless legs. Slowly taper down on gabapentin dose - may try 300/600/600/600 for 1 week then drop to 300/300/600/600 for 1 week and continue taper that way until you're down to '300mg'$  four times a day. We will order head CT scan after recent fall and refer you to local neurology for RLS. Return as needed or in 3 months for wellness visit   Recent consult visits:  03/07/21 - Palliative care home visits   Hospital visits:  03/31/21 thru 04/06/21 [facility] admission s/p UTI with fall   Medications: Outpatient Encounter Medications as of 04/08/2021  Medication Sig   Alpha-Lipoic Acid 600 MG CAPS Take 1 capsule (600 mg total) by mouth daily.   BD PEN NEEDLE NANO 2ND GEN 32G X 4 MM MISC USE TO INJECT MEDICATION DAILY   brimonidine (ALPHAGAN) 0.2 % ophthalmic solution    clopidogrel (PLAVIX) 75 MG tablet TAKE 1 TABLET DAILY   Coenzyme Q10 (COQ10) 150 MG CAPS Take 1 capsule by mouth daily.   COMBIGAN 0.2-0.5 % ophthalmic solution    docusate sodium (COLACE) 100 MG capsule Take 1 capsule (100 mg total) by mouth 2 (two) times daily as needed for mild constipation.   dorzolamide-timolol (COSOPT) 22.3-6.8 MG/ML ophthalmic solution    DULoxetine (CYMBALTA) 60 MG capsule TAKE 1 CAPSULE DAILY   famotidine (PEPCID) 20 MG tablet Take 1 tablet (20 mg total) by mouth at bedtime.   Fluocinolone Acetonide 0.01 % OIL    gabapentin (NEURONTIN) 300 MG capsule Take 600 mg by mouth 3 (three) times daily. 600 mg BID is his minimum dose without having breakthrough symptoms. He is aware 900 mg/day is recommended per kidney function.   insulin aspart (NOVOLOG) 100 UNIT/ML injection CBG 70 - 120: 0 units  CBG 121 - 150: 1 unit  CBG 151 -  200: 2 units  CBG 201 - 250: 3 units  CBG 251 - 300: 5 units  CBG 301 - 350: 7 units  CBG 351 - 400: 9 units   insulin glargine (LANTUS) 100 UNIT/ML injection Inject 0.4 mLs (40 Units total) into the skin at bedtime.   ipratropium (ATROVENT) 0.03 % nasal spray    l-methylfolate-B6-B12 (METANX) 3-35-2 MG TABS tablet Take 1 tablet by mouth daily.   levothyroxine (SYNTHROID) 50 MCG tablet Take 1 tablet (50 mcg total) by mouth daily before breakfast. Per endo [name]   LUMIGAN 0.01 % SOLN Place 1 drop into the right eye at bedtime.   melatonin 5 MG TABS Take 5 mg by mouth at bedtime.   nitroGLYCERIN (NITROSTAT) 0.4 MG SL tablet DISSOLVE 1 TABLET UNDER THE TONGUE EVERY 5 MINUTES AS NEEDED FOR CHEST PAIN   ofloxacin (FLOXIN) 0.3 % OTIC solution    Omega-3 Fatty Acids (FISH OIL) 1000 MG CAPS Take 1 capsule (1,000 mg total) by mouth in the morning and at bedtime.   ondansetron (ZOFRAN) 4 MG tablet Take 1 tablet (4 mg total) by mouth every 8 (eight) hours as needed for nausea or vomiting.   polyethylene glycol (MIRALAX / GLYCOLAX) 17 g packet Take 17 g by mouth daily as needed.   pramipexole (MIRAPEX) 1 MG tablet Take 1 tablet (1 mg total)  by mouth See admin instructions. At 4pm   RABEprazole (ACIPHEX) 20 MG tablet TAKE 1 TABLET DAILY   rosuvastatin (CRESTOR) 40 MG tablet Take 1 tablet (40 mg total) by mouth daily.   senna-docusate (SENOKOT-S) 8.6-50 MG tablet Take 1 tablet by mouth at bedtime.   tamsulosin (FLOMAX) 0.4 MG CAPS capsule Take 1 capsule (0.4 mg total) by mouth daily after breakfast.   vitamin C (ASCORBIC ACID) 500 MG tablet Take 1,000 mg by mouth daily as needed.   No facility-administered encounter medications on file as of 04/08/2021.     Recent Relevant Labs: Lab Results  Component Value Date/Time   HGBA1C 8.5 (H) 04/02/2021 10:43 AM   HGBA1C 8.0 (H) 11/03/2020 04:43 AM   HGBA1C 8.6 12/05/2019 12:00 AM   HGBA1C 7.9 08/21/2019 12:00 AM   MICROALBUR 2.0 (H) 04/21/2013 11:19 AM    MICROALBUR 1.9 07/05/2012 11:21 AM    Kidney Function Lab Results  Component Value Date/Time   CREATININE 1.29 (H) 04/03/2021 04:45 AM   CREATININE 1.31 (H) 04/02/2021 10:43 AM   GFR 41.64 (L) 11/10/2020 01:08 PM   GFRNONAA 54 (L) 04/03/2021 04:45 AM   GFRAA 54 (L) 04/21/2020 02:00 PM   Attempted contact with [name], MD 3 times on 04/08/21,04/11/21,04/12/21. Unsuccessful outreach, will attempt again next month.    Current antihyperglycemic regimen:  Ozempic 1 mg - Inject once weekly Tresiba - Inject 90 units at bedtime Humalog (Insulin Lispro) Inject 4 units prior to lunchtime meal (taking 5-15 units pre-meal PRN if eating large meal)  Have there been any recent hospitalizations or ED visits since last visit with CPP? Yes 03/31/21 thru 04/06/21 [facility] admission s/p UTI with fall  added amoxicillin 875 take 1 tablet 2 times daily     Adherence Review: Is the patient currently on a STATIN medication? Yes Is the patient currently on ACE/ARB medication? Yes Does the patient have >5 day gap between last estimated fill dates? No  Care Gaps: Last annual wellness visit:  sched 05/2021  Star Rating Drugs:  Medication:  Last Fill: Day Supply Lantus   04/05/21 - Novolog  03/25/21  - Rosuvastatin '40mg'$  03/25/21  - Jardiance '10mg'$  03/25/21  - Tyler Aas 200unit/ml 04/07/21 90 Ozempic '1mg'$ /dose 04/05/21 28 Valsartan '40mg'$  03/25/21  90  PCP appointment on 06/13/2021  [name], CPP notified  [name], [place] Assistant [phone]  I have reviewed the care management and care coordination activities outlined in this encounter and I am certifying that I agree with the content of this note. No further action required.  [name], PharmD Clinical Pharmacist [facility] at [facility] [phone]

## 2021-04-11 DIAGNOSIS — M6281 Muscle weakness (generalized): Secondary | ICD-10-CM | POA: Diagnosis not present

## 2021-04-11 DIAGNOSIS — E1165 Type 2 diabetes mellitus with hyperglycemia: Secondary | ICD-10-CM | POA: Diagnosis not present

## 2021-04-11 DIAGNOSIS — I10 Essential (primary) hypertension: Secondary | ICD-10-CM | POA: Diagnosis not present

## 2021-04-11 DIAGNOSIS — N179 Acute kidney failure, unspecified: Secondary | ICD-10-CM | POA: Diagnosis not present

## 2021-04-11 DIAGNOSIS — R339 Retention of urine, unspecified: Secondary | ICD-10-CM | POA: Diagnosis not present

## 2021-04-11 DIAGNOSIS — D696 Thrombocytopenia, unspecified: Secondary | ICD-10-CM | POA: Diagnosis not present

## 2021-04-12 DIAGNOSIS — M6281 Muscle weakness (generalized): Secondary | ICD-10-CM | POA: Diagnosis not present

## 2021-04-12 DIAGNOSIS — N179 Acute kidney failure, unspecified: Secondary | ICD-10-CM | POA: Diagnosis not present

## 2021-04-12 DIAGNOSIS — E1165 Type 2 diabetes mellitus with hyperglycemia: Secondary | ICD-10-CM | POA: Diagnosis not present

## 2021-04-12 DIAGNOSIS — I10 Essential (primary) hypertension: Secondary | ICD-10-CM | POA: Diagnosis not present

## 2021-04-12 DIAGNOSIS — R339 Retention of urine, unspecified: Secondary | ICD-10-CM | POA: Diagnosis not present

## 2021-04-13 ENCOUNTER — Encounter: Payer: Self-pay | Admitting: Urology

## 2021-04-13 ENCOUNTER — Ambulatory Visit: Payer: Self-pay | Admitting: Physician Assistant

## 2021-04-13 ENCOUNTER — Other Ambulatory Visit: Payer: Self-pay

## 2021-04-13 ENCOUNTER — Inpatient Hospital Stay: Payer: Medicare Other | Admitting: Family Medicine

## 2021-04-13 ENCOUNTER — Ambulatory Visit (INDEPENDENT_AMBULATORY_CARE_PROVIDER_SITE_OTHER): Payer: Medicare Other | Admitting: Urology

## 2021-04-13 VITALS — BP 117/49 | HR 70 | Ht 68.0 in | Wt 180.0 lb

## 2021-04-13 DIAGNOSIS — R339 Retention of urine, unspecified: Secondary | ICD-10-CM | POA: Diagnosis not present

## 2021-04-13 DIAGNOSIS — N472 Paraphimosis: Secondary | ICD-10-CM

## 2021-04-13 LAB — BLADDER SCAN AMB NON-IMAGING

## 2021-04-13 MED ORDER — FLUCONAZOLE 100 MG PO TABS: 100.0000 mg | ORAL_TABLET | Freq: Every day | ORAL | 0 refills | Status: AC

## 2021-04-13 NOTE — Addendum Note (Signed)
Addended by: Evelina Bucy on: 04/13/2021 01:48 PM   Modules accepted: Orders

## 2021-04-13 NOTE — Addendum Note (Signed)
Addended by: Mickle Plumb on: 04/13/2021 02:04 PM   Modules accepted: Orders

## 2021-04-13 NOTE — Progress Notes (Signed)
04/13/2021 8:30 AM   James Spurr, MD 02-Jun-1931 [id number]  Referring provider: [name], MD [place],  [place] [address]  Chief Complaint  Patient presents with   Other    HPI: James Bell is an 87 y.o. male who presents for urinary retention.  Hospitalized to [facility] 03/31/2021 after sustaining a fall As part of his evaluation he had been complaining of weakness, difficulty walking and urinary frequency and urgency Urinalysis showed large leukocytes and pyuria; he had mild leukocytosis Admitted for UTI and started on IV Rocephin; urine culture returned and significant growth CT abdomen showed bilateral renal cysts; no hydronephrosis or calculi On the day of discharge he was complaining of incomplete bladder emptying and a bladder scan was performed which showed >995 mL of urine.  A Foley catheter was placed and he was discharged to SNF with instructions of urologic follow-up Past urologic history remarkable for BPH and he underwent PVP ~ 2010 and [place].  Saw Dr. [name] in [place] and 2014 for severe LUTS with as directed study showing a 550 cc bladder capacity with low amplitude instability and urinary retention.  He was treated with transurethral incision prostate and meatal dilation for a bladder neck contracture and meatal stenosis He states he did well after the procedure however over the last year has noted progressive decreased force and caliber stream, difficulty urinating stating he typically has to lean forward to empty the bladder He states it was painful when the catheter was placed and since that time he has been complaining of penile pain and swelling.  He is uncircumcised   PMH: Past Medical History:  Diagnosis Date   Anemia    Basal cell carcinoma 2018   R temple    Basal cell carcinoma of nose 2002   bridge of nose   BPH with obstruction/lower urinary tract symptoms    failed flomax, uroxatral,  myrbetriq - sees urology [name]) pending videourodynamics   CKD (chronic kidney disease) stage 3, GFR 30-59 ml/min ([place]) 03/11/2012   Renal US 02/2014 - multiple kidney cysts    CVA (cerebral infarction) 03/07/12   slurred speech; right hemplegia, left handed - completed PT 07/2014 (17 sessions)   Depression    mild, on cymbalta per prior PCP   Erectile dysfunction    per urology (prostaglandin injection)   GERD (gastroesophageal reflux disease)    with sliding HH   H/O hiatal hernia    History of kidney problems 12/2010   lacerated left kidney after fall   HLD (hyperlipidemia)    Hx of basal cell carcinoma 07/10/2017   R. lat. forehead near hair line   Kidney cysts 02/2014   bilateral (renal US)   Migraines 03/11/12   now rare   OSA on CPAP    sleep study 01/2014 - severe, CPAP at 10, previously on nuvigil (Dr. [name])   Palpitations 01/2012   holter WNL, rare PAC/PVCs   Presbycusis of both ears 2016   rec amplification [facility])   Restless leg syndrome    well controlled on mirapex   Rosacea    [name]   Stroke [facility])    TIA (transient ischemic attack)    Type 2 diabetes, controlled, with neuropathy ([place]) 2011   [name] in [facility]    Surgical History: Past Surgical History:  Procedure Laterality Date   ABI  2007   WNL   CARDIAC CATHETERIZATION  2004   normal; [place]   CARDIOVASCULAR STRESS  TEST  02/2014   WNL, low risk scan, EF 68%   CAROTID ENDARTERECTOMY     CATARACT EXTRACTION W/ INTRAOCULAR LENS IMPLANT  11//2012 - 08/2011   COLONOSCOPY  03/2011   poor prep, unable to biopsy polyp in tic fold, rpt 3 mo [name])   COLONOSCOPY  06/2011   mult polyps adenomatous, severe diverticulosis, rpt 3 yrs [name])   CYSTOSCOPY WITH URETHRAL DILATATION N/A 07/24/2013   Procedure: CYSTOSCOPY WITH URETHRAL DILATATION;  Surgeon: [name], MD;  Location: [facility];  Service: Urology;  Laterality: N/A;   ESOPHAGOGASTRODUODENOSCOPY  2006   duodenitis, medual HH, Grade 2  reflux, mild gastritis   ESOPHAGOGASTRODUODENOSCOPY  07/2011   mild chronic gastritis   HEMORRHOID SURGERY  1991   INGUINAL HERNIA REPAIR  1981; 2002   left, then repaired   MRI brain  04/2010   WNL   PROSTATE SURGERY  02/2009   PVP (laser)   SKIN CANCER EXCISION  2002   bridge of nose, BCC   TONSILLECTOMY AND ADENOIDECTOMY  ~ [place] N/A 07/24/2013   Procedure: TRANSURETHRAL INCISION OF THE PROSTATE (TUIP);  Surgeon: [name], MD;  Location: [facility];  Service: Urology;  Laterality: N/A;   URETHRAL DILATION  2014   [place]    Home Medications:  Allergies as of 04/13/2021       Reactions   Crestor [rosuvastatin]    Other reaction(s): muscle pain 2.16.2022 Pt is current on this medication without any issues.   Dulaglutide    Other reaction(s): upset stomach   Empagliflozin    Other reaction(s): yeast infection   Gabapentin Other (See Comments)   At high doses - BLURRED VISION, BALLANCE, CONCENTRATION PROBLEMS, RESTLESSNESS, NOT SLEEPING, ? LOST OF STRENGTH, UNSTEADINESS   Lisinopril Cough   tachycardia   Nexium [esomeprazole] Other (See Comments)   Headache   Niacin And Related Other (See Comments)   flushing   Oxycodone Other (See Comments)   Even 1/2 tablet of '5mg'$  caused increased confusion - contributory to hospitalization 10/2020 with AMS   Pregabalin    Other reaction(s): MALAISE, SLIGHT NAUSEA, SLIGHT HEADACHE        Medication List        Accurate as of April 13, 2021  8:30 AM. If you have any questions, ask your nurse or doctor.          Alpha-Lipoic Acid 600 MG Caps Take 1 capsule (600 mg total) by mouth daily.   BD Pen Needle Nano 2nd Gen 32G X 4 MM Misc Generic drug: Insulin Pen Needle USE TO INJECT MEDICATION DAILY   brimonidine 0.2 % ophthalmic solution Commonly known as: ALPHAGAN   clopidogrel 75 MG tablet Commonly known as: PLAVIX TAKE 1 TABLET DAILY   Combigan 0.2-0.5 % ophthalmic  solution Generic drug: brimonidine-timolol   COQ10 150 MG Caps Take 1 capsule by mouth daily.   docusate sodium 100 MG capsule Commonly known as: COLACE Take 1 capsule (100 mg total) by mouth 2 (two) times daily as needed for mild constipation.   dorzolamide-timolol 22.3-6.8 MG/ML ophthalmic solution Commonly known as: COSOPT   DULoxetine 60 MG capsule Commonly known as: CYMBALTA TAKE 1 CAPSULE DAILY   famotidine 20 MG tablet Commonly known as: Pepcid Take 1 tablet (20 mg total) by mouth at bedtime.   Fish Oil 1000 MG Caps Take 1 capsule (1,000 mg total) by mouth in the morning and at bedtime.   Fluocinolone Acetonide 0.01 % Oil  gabapentin 300 MG capsule Commonly known as: NEURONTIN Take 600 mg by mouth 3 (three) times daily. 600 mg BID is his minimum dose without having breakthrough symptoms. He is aware 900 mg/day is recommended per kidney function.   insulin aspart 100 UNIT/ML injection Commonly known as: novoLOG CBG 70 - 120: 0 units  CBG 121 - 150: 1 unit  CBG 151 - 200: 2 units  CBG 201 - 250: 3 units  CBG 251 - 300: 5 units  CBG 301 - 350: 7 units  CBG 351 - 400: 9 units   insulin glargine 100 UNIT/ML injection Commonly known as: LANTUS Inject 0.4 mLs (40 Units total) into the skin at bedtime.   ipratropium 0.03 % nasal spray Commonly known as: ATROVENT   l-methylfolate-B6-B12 3-35-2 MG Tabs tablet Commonly known as: METANX Take 1 tablet by mouth daily.   levothyroxine 50 MCG tablet Commonly known as: SYNTHROID Take 1 tablet (50 mcg total) by mouth daily before breakfast. Per endo [name]   Lumigan 0.01 % Soln Generic drug: bimatoprost Place 1 drop into the right eye at bedtime.   melatonin 5 MG Tabs Take 5 mg by mouth at bedtime.   nitroGLYCERIN 0.4 MG SL tablet Commonly known as: Nitrostat DISSOLVE 1 TABLET UNDER THE TONGUE EVERY 5 MINUTES AS NEEDED FOR CHEST PAIN   ofloxacin 0.3 % OTIC solution Commonly known as: FLOXIN   ondansetron 4  MG tablet Commonly known as: Zofran Take 1 tablet (4 mg total) by mouth every 8 (eight) hours as needed for nausea or vomiting.   polyethylene glycol 17 g packet Commonly known as: MIRALAX / GLYCOLAX Take 17 g by mouth daily as needed.   pramipexole 1 MG tablet Commonly known as: MIRAPEX Take 1 tablet (1 mg total) by mouth See admin instructions. At 4pm   RABEprazole 20 MG tablet Commonly known as: ACIPHEX TAKE 1 TABLET DAILY   rosuvastatin 40 MG tablet Commonly known as: Crestor Take 1 tablet (40 mg total) by mouth daily.   senna-docusate 8.6-50 MG tablet Commonly known as: Senokot-S Take 1 tablet by mouth at bedtime.   tamsulosin 0.4 MG Caps capsule Commonly known as: FLOMAX Take 1 capsule (0.4 mg total) by mouth daily after breakfast.   vitamin C 500 MG tablet Commonly known as: ASCORBIC ACID Take 1,000 mg by mouth daily as needed.        Allergies:  Allergies  Allergen Reactions   Crestor [Rosuvastatin]     Other reaction(s): muscle pain 2.16.2022 Pt is current on this medication without any issues.   Dulaglutide     Other reaction(s): upset stomach   Empagliflozin     Other reaction(s): yeast infection   Gabapentin Other (See Comments)    At high doses - BLURRED VISION, BALLANCE, CONCENTRATION PROBLEMS, RESTLESSNESS, NOT SLEEPING, ? LOST OF STRENGTH, UNSTEADINESS   Lisinopril Cough    tachycardia   Nexium [Esomeprazole] Other (See Comments)    Headache   Niacin And Related Other (See Comments)    flushing   Oxycodone Other (See Comments)    Even 1/2 tablet of '5mg'$  caused increased confusion - contributory to hospitalization 10/2020 with AMS   Pregabalin     Other reaction(s): MALAISE, SLIGHT NAUSEA, SLIGHT HEADACHE    Family History: Family History  Problem Relation Age of Onset   Stroke Father    Lung cancer Father 59        smoker   Stroke Brother    Diabetes Brother 60  Lung cancer Brother    Alcoholism Sister    Kidney disease Sister     Coronary artery disease Neg Hx     Social History:  reports that he has never smoked. He has never used smokeless tobacco. He reports current alcohol use of about 4.0 standard drinks of alcohol per week. He reports that he does not use drugs.   Physical Exam: BP (!) 117/49   Pulse 70   Ht '5\' 8"'$  (1.727 m)   Wt 180 lb (81.6 kg)   BMI 27.37 kg/m   Constitutional:  Alert, No acute distress. HEENT: [place] AT, moist mucus membranes.  Trachea midline, no masses. Cardiovascular: No clubbing, cyanosis, or edema. Respiratory: Normal respiratory effort, no increased work of breathing. GU: Foley catheter draining clear urine.  Foreskin is retracted with a collar of edematous prepuce Skin: No rashes, bruises or suspicious lesions. Psychiatric: Normal mood and affect.   Assessment & Plan:    Urinary retention Catheter removed for voiding trial He has a follow-up appointment this afternoon for bladder scan Schedule cystoscopy for elevated residual recurrent retention If he is adequately emptying would recommend a 1 month follow-up for repeat bladder scan  2.  Paraphimosis Reduced without difficulty Instructed not to retract foreskin for at least the next 7 days Rx Diflucan 100 mg daily x5 days   Abbie Sons, MD  [place] [address] [place], [place] [address] [phone]

## 2021-04-13 NOTE — Progress Notes (Signed)
Catheter Removal  Patient is present today for a catheter removal.  8 ml of water was drained from the balloon. A 16FR foley cath was removed from the bladder no complications were noted . Patient tolerated well.  Performed by: Gaspar Cola CMA   Follow up/ Additional notes: This afternoon

## 2021-04-13 NOTE — Progress Notes (Signed)
Afternoon follow-up  Patient returned to clinic this afternoon for repeat PVR. He reports drinking approximately 1L of fluid. He has been able to urinate. PVR 177m. He denies difficulty voiding.  Results for orders placed or performed in visit on 04/13/21  Bladder Scan (Post Void Residual) in office  Result Value Ref Range   Scan Result 1970m   *Note: Due to a large number of results and/or encounters for the requested time period, some results have not been displayed. A complete set of results can be found in Results Review.    Voiding trial passed.  Follow up: Return in about 4 weeks (around 05/11/2021) for Repeat PVR.

## 2021-04-14 DIAGNOSIS — N179 Acute kidney failure, unspecified: Secondary | ICD-10-CM | POA: Diagnosis not present

## 2021-04-14 DIAGNOSIS — R339 Retention of urine, unspecified: Secondary | ICD-10-CM | POA: Diagnosis not present

## 2021-04-14 DIAGNOSIS — N481 Balanitis: Secondary | ICD-10-CM | POA: Diagnosis not present

## 2021-04-14 DIAGNOSIS — E1165 Type 2 diabetes mellitus with hyperglycemia: Secondary | ICD-10-CM | POA: Diagnosis not present

## 2021-04-14 DIAGNOSIS — N472 Paraphimosis: Secondary | ICD-10-CM | POA: Diagnosis not present

## 2021-04-14 DIAGNOSIS — D696 Thrombocytopenia, unspecified: Secondary | ICD-10-CM | POA: Diagnosis not present

## 2021-04-16 DIAGNOSIS — M6281 Muscle weakness (generalized): Secondary | ICD-10-CM | POA: Diagnosis not present

## 2021-04-16 DIAGNOSIS — N481 Balanitis: Secondary | ICD-10-CM | POA: Diagnosis not present

## 2021-04-16 DIAGNOSIS — I10 Essential (primary) hypertension: Secondary | ICD-10-CM | POA: Diagnosis not present

## 2021-04-16 DIAGNOSIS — H409 Unspecified glaucoma: Secondary | ICD-10-CM | POA: Diagnosis not present

## 2021-04-16 DIAGNOSIS — D696 Thrombocytopenia, unspecified: Secondary | ICD-10-CM | POA: Diagnosis not present

## 2021-04-16 DIAGNOSIS — E1165 Type 2 diabetes mellitus with hyperglycemia: Secondary | ICD-10-CM | POA: Diagnosis not present

## 2021-04-16 DIAGNOSIS — R339 Retention of urine, unspecified: Secondary | ICD-10-CM | POA: Diagnosis not present

## 2021-04-16 DIAGNOSIS — N179 Acute kidney failure, unspecified: Secondary | ICD-10-CM | POA: Diagnosis not present

## 2021-04-16 DIAGNOSIS — N472 Paraphimosis: Secondary | ICD-10-CM | POA: Diagnosis not present

## 2021-04-19 ENCOUNTER — Telehealth: Payer: Self-pay | Admitting: Family Medicine

## 2021-04-19 NOTE — Telephone Encounter (Signed)
What's going on with R leg? Have we discussed this in the past?  Is he still at ALF or back at home? Likely need OV to review.

## 2021-04-19 NOTE — Telephone Encounter (Signed)
Mr. Borak called in wanted to know if Dr. Darnell Level could do a referral for Orthopedic Surgeon for his right leg. He stated he wanted Dr. Darnell Level recommendation

## 2021-04-20 DIAGNOSIS — I69398 Other sequelae of cerebral infarction: Secondary | ICD-10-CM | POA: Diagnosis not present

## 2021-04-20 DIAGNOSIS — R296 Repeated falls: Secondary | ICD-10-CM | POA: Diagnosis not present

## 2021-04-20 DIAGNOSIS — M6281 Muscle weakness (generalized): Secondary | ICD-10-CM | POA: Diagnosis not present

## 2021-04-20 DIAGNOSIS — S8391XA Sprain of unspecified site of right knee, initial encounter: Secondary | ICD-10-CM | POA: Diagnosis not present

## 2021-04-20 DIAGNOSIS — R279 Unspecified lack of coordination: Secondary | ICD-10-CM | POA: Diagnosis not present

## 2021-04-20 NOTE — Telephone Encounter (Signed)
Lvm asking pt to call back.  Need to get answers to Dr. Synthia Innocent questions and schedule OV as necessary.

## 2021-04-21 DIAGNOSIS — M6281 Muscle weakness (generalized): Secondary | ICD-10-CM | POA: Diagnosis not present

## 2021-04-21 DIAGNOSIS — I69398 Other sequelae of cerebral infarction: Secondary | ICD-10-CM | POA: Diagnosis not present

## 2021-04-21 DIAGNOSIS — R279 Unspecified lack of coordination: Secondary | ICD-10-CM | POA: Diagnosis not present

## 2021-04-21 NOTE — Telephone Encounter (Signed)
Lvm asking pt to call back.  Need to get answers to Dr. Synthia Innocent questions and schedule OV as necessary.

## 2021-04-22 DIAGNOSIS — R2681 Unsteadiness on feet: Secondary | ICD-10-CM | POA: Diagnosis not present

## 2021-04-22 DIAGNOSIS — M25561 Pain in right knee: Secondary | ICD-10-CM | POA: Diagnosis not present

## 2021-04-22 DIAGNOSIS — M6281 Muscle weakness (generalized): Secondary | ICD-10-CM | POA: Diagnosis not present

## 2021-04-22 DIAGNOSIS — R296 Repeated falls: Secondary | ICD-10-CM | POA: Diagnosis not present

## 2021-04-22 DIAGNOSIS — R279 Unspecified lack of coordination: Secondary | ICD-10-CM | POA: Diagnosis not present

## 2021-04-22 NOTE — Telephone Encounter (Signed)
Lvm asking pt to call back.  Need to get answers to Dr. Synthia Innocent questions and schedule OV as necessary.

## 2021-04-25 DIAGNOSIS — R279 Unspecified lack of coordination: Secondary | ICD-10-CM | POA: Diagnosis not present

## 2021-04-25 DIAGNOSIS — R296 Repeated falls: Secondary | ICD-10-CM | POA: Diagnosis not present

## 2021-04-25 DIAGNOSIS — I69398 Other sequelae of cerebral infarction: Secondary | ICD-10-CM | POA: Diagnosis not present

## 2021-04-25 DIAGNOSIS — M6281 Muscle weakness (generalized): Secondary | ICD-10-CM | POA: Diagnosis not present

## 2021-04-25 NOTE — Telephone Encounter (Signed)
Lvm asking pt to call back.  Need to get answers to Dr. Synthia Innocent questions and schedule OV as necessary.

## 2021-04-26 DIAGNOSIS — R2681 Unsteadiness on feet: Secondary | ICD-10-CM | POA: Diagnosis not present

## 2021-04-26 DIAGNOSIS — R279 Unspecified lack of coordination: Secondary | ICD-10-CM | POA: Diagnosis not present

## 2021-04-26 DIAGNOSIS — M25561 Pain in right knee: Secondary | ICD-10-CM | POA: Diagnosis not present

## 2021-04-26 DIAGNOSIS — M6281 Muscle weakness (generalized): Secondary | ICD-10-CM | POA: Diagnosis not present

## 2021-04-26 DIAGNOSIS — R296 Repeated falls: Secondary | ICD-10-CM | POA: Diagnosis not present

## 2021-04-26 NOTE — Telephone Encounter (Addendum)
Lvm asking pt to call back.  Need to get answers to Dr. Synthia Innocent questions and schedule OV as necessary.  Mailing a letter.

## 2021-04-27 DIAGNOSIS — M6281 Muscle weakness (generalized): Secondary | ICD-10-CM | POA: Diagnosis not present

## 2021-04-27 DIAGNOSIS — M25561 Pain in right knee: Secondary | ICD-10-CM | POA: Diagnosis not present

## 2021-04-27 DIAGNOSIS — R279 Unspecified lack of coordination: Secondary | ICD-10-CM | POA: Diagnosis not present

## 2021-04-27 DIAGNOSIS — R2681 Unsteadiness on feet: Secondary | ICD-10-CM | POA: Diagnosis not present

## 2021-04-28 DIAGNOSIS — R296 Repeated falls: Secondary | ICD-10-CM | POA: Diagnosis not present

## 2021-04-28 DIAGNOSIS — I69398 Other sequelae of cerebral infarction: Secondary | ICD-10-CM | POA: Diagnosis not present

## 2021-04-28 DIAGNOSIS — R279 Unspecified lack of coordination: Secondary | ICD-10-CM | POA: Diagnosis not present

## 2021-04-28 DIAGNOSIS — M6281 Muscle weakness (generalized): Secondary | ICD-10-CM | POA: Diagnosis not present

## 2021-04-29 DIAGNOSIS — H401132 Primary open-angle glaucoma, bilateral, moderate stage: Secondary | ICD-10-CM | POA: Diagnosis not present

## 2021-04-29 DIAGNOSIS — I69398 Other sequelae of cerebral infarction: Secondary | ICD-10-CM | POA: Diagnosis not present

## 2021-04-29 DIAGNOSIS — R2681 Unsteadiness on feet: Secondary | ICD-10-CM | POA: Diagnosis not present

## 2021-04-29 DIAGNOSIS — R296 Repeated falls: Secondary | ICD-10-CM | POA: Diagnosis not present

## 2021-04-29 DIAGNOSIS — M6281 Muscle weakness (generalized): Secondary | ICD-10-CM | POA: Diagnosis not present

## 2021-04-29 DIAGNOSIS — M25561 Pain in right knee: Secondary | ICD-10-CM | POA: Diagnosis not present

## 2021-04-29 DIAGNOSIS — R279 Unspecified lack of coordination: Secondary | ICD-10-CM | POA: Diagnosis not present

## 2021-05-01 DIAGNOSIS — R279 Unspecified lack of coordination: Secondary | ICD-10-CM | POA: Diagnosis not present

## 2021-05-01 DIAGNOSIS — M25561 Pain in right knee: Secondary | ICD-10-CM | POA: Diagnosis not present

## 2021-05-01 DIAGNOSIS — R2681 Unsteadiness on feet: Secondary | ICD-10-CM | POA: Diagnosis not present

## 2021-05-01 DIAGNOSIS — M6281 Muscle weakness (generalized): Secondary | ICD-10-CM | POA: Diagnosis not present

## 2021-05-03 DIAGNOSIS — R296 Repeated falls: Secondary | ICD-10-CM | POA: Diagnosis not present

## 2021-05-03 DIAGNOSIS — M6281 Muscle weakness (generalized): Secondary | ICD-10-CM | POA: Diagnosis not present

## 2021-05-03 DIAGNOSIS — I69398 Other sequelae of cerebral infarction: Secondary | ICD-10-CM | POA: Diagnosis not present

## 2021-05-03 DIAGNOSIS — R2681 Unsteadiness on feet: Secondary | ICD-10-CM | POA: Diagnosis not present

## 2021-05-03 DIAGNOSIS — R279 Unspecified lack of coordination: Secondary | ICD-10-CM | POA: Diagnosis not present

## 2021-05-03 DIAGNOSIS — M25561 Pain in right knee: Secondary | ICD-10-CM | POA: Diagnosis not present

## 2021-05-04 DIAGNOSIS — I69398 Other sequelae of cerebral infarction: Secondary | ICD-10-CM | POA: Diagnosis not present

## 2021-05-04 DIAGNOSIS — M6281 Muscle weakness (generalized): Secondary | ICD-10-CM | POA: Diagnosis not present

## 2021-05-04 DIAGNOSIS — R279 Unspecified lack of coordination: Secondary | ICD-10-CM | POA: Diagnosis not present

## 2021-05-04 DIAGNOSIS — R296 Repeated falls: Secondary | ICD-10-CM | POA: Diagnosis not present

## 2021-05-05 DIAGNOSIS — M25561 Pain in right knee: Secondary | ICD-10-CM | POA: Diagnosis not present

## 2021-05-05 DIAGNOSIS — R296 Repeated falls: Secondary | ICD-10-CM | POA: Diagnosis not present

## 2021-05-05 DIAGNOSIS — I69398 Other sequelae of cerebral infarction: Secondary | ICD-10-CM | POA: Diagnosis not present

## 2021-05-05 DIAGNOSIS — M6281 Muscle weakness (generalized): Secondary | ICD-10-CM | POA: Diagnosis not present

## 2021-05-05 DIAGNOSIS — R2681 Unsteadiness on feet: Secondary | ICD-10-CM | POA: Diagnosis not present

## 2021-05-05 DIAGNOSIS — R279 Unspecified lack of coordination: Secondary | ICD-10-CM | POA: Diagnosis not present

## 2021-05-08 DIAGNOSIS — R269 Unspecified abnormalities of gait and mobility: Secondary | ICD-10-CM | POA: Diagnosis not present

## 2021-05-08 DIAGNOSIS — E1140 Type 2 diabetes mellitus with diabetic neuropathy, unspecified: Secondary | ICD-10-CM | POA: Diagnosis not present

## 2021-05-08 DIAGNOSIS — I69351 Hemiplegia and hemiparesis following cerebral infarction affecting right dominant side: Secondary | ICD-10-CM | POA: Diagnosis not present

## 2021-05-08 DIAGNOSIS — G8110 Spastic hemiplegia affecting unspecified side: Secondary | ICD-10-CM | POA: Diagnosis not present

## 2021-05-08 DIAGNOSIS — G2581 Restless legs syndrome: Secondary | ICD-10-CM | POA: Diagnosis not present

## 2021-05-09 DIAGNOSIS — R2681 Unsteadiness on feet: Secondary | ICD-10-CM | POA: Diagnosis not present

## 2021-05-09 DIAGNOSIS — M6281 Muscle weakness (generalized): Secondary | ICD-10-CM | POA: Diagnosis not present

## 2021-05-09 DIAGNOSIS — R279 Unspecified lack of coordination: Secondary | ICD-10-CM | POA: Diagnosis not present

## 2021-05-09 DIAGNOSIS — R296 Repeated falls: Secondary | ICD-10-CM | POA: Diagnosis not present

## 2021-05-09 DIAGNOSIS — M25561 Pain in right knee: Secondary | ICD-10-CM | POA: Diagnosis not present

## 2021-05-10 DIAGNOSIS — R279 Unspecified lack of coordination: Secondary | ICD-10-CM | POA: Diagnosis not present

## 2021-05-10 DIAGNOSIS — I69398 Other sequelae of cerebral infarction: Secondary | ICD-10-CM | POA: Diagnosis not present

## 2021-05-10 DIAGNOSIS — E785 Hyperlipidemia, unspecified: Secondary | ICD-10-CM | POA: Diagnosis not present

## 2021-05-10 DIAGNOSIS — Z794 Long term (current) use of insulin: Secondary | ICD-10-CM | POA: Diagnosis not present

## 2021-05-10 DIAGNOSIS — I6930 Unspecified sequelae of cerebral infarction: Secondary | ICD-10-CM | POA: Diagnosis not present

## 2021-05-10 DIAGNOSIS — R609 Edema, unspecified: Secondary | ICD-10-CM | POA: Diagnosis not present

## 2021-05-10 DIAGNOSIS — E1142 Type 2 diabetes mellitus with diabetic polyneuropathy: Secondary | ICD-10-CM | POA: Diagnosis not present

## 2021-05-10 DIAGNOSIS — E039 Hypothyroidism, unspecified: Secondary | ICD-10-CM | POA: Diagnosis not present

## 2021-05-10 DIAGNOSIS — M6281 Muscle weakness (generalized): Secondary | ICD-10-CM | POA: Diagnosis not present

## 2021-05-10 DIAGNOSIS — R296 Repeated falls: Secondary | ICD-10-CM | POA: Diagnosis not present

## 2021-05-11 ENCOUNTER — Other Ambulatory Visit: Payer: Self-pay

## 2021-05-11 ENCOUNTER — Encounter: Payer: Self-pay | Admitting: Family Medicine

## 2021-05-11 ENCOUNTER — Ambulatory Visit (INDEPENDENT_AMBULATORY_CARE_PROVIDER_SITE_OTHER): Payer: Medicare Other | Admitting: Family Medicine

## 2021-05-11 VITALS — BP 112/60 | HR 68 | Temp 98.1°F | Ht 68.0 in | Wt 194.0 lb

## 2021-05-11 DIAGNOSIS — G2581 Restless legs syndrome: Secondary | ICD-10-CM | POA: Diagnosis not present

## 2021-05-11 DIAGNOSIS — IMO0002 Reserved for concepts with insufficient information to code with codable children: Secondary | ICD-10-CM

## 2021-05-11 DIAGNOSIS — M6281 Muscle weakness (generalized): Secondary | ICD-10-CM | POA: Diagnosis not present

## 2021-05-11 DIAGNOSIS — R531 Weakness: Secondary | ICD-10-CM | POA: Diagnosis not present

## 2021-05-11 DIAGNOSIS — R269 Unspecified abnormalities of gait and mobility: Secondary | ICD-10-CM | POA: Diagnosis not present

## 2021-05-11 DIAGNOSIS — I69398 Other sequelae of cerebral infarction: Secondary | ICD-10-CM | POA: Diagnosis not present

## 2021-05-11 DIAGNOSIS — I69351 Hemiplegia and hemiparesis following cerebral infarction affecting right dominant side: Secondary | ICD-10-CM

## 2021-05-11 DIAGNOSIS — R279 Unspecified lack of coordination: Secondary | ICD-10-CM | POA: Diagnosis not present

## 2021-05-11 DIAGNOSIS — M25561 Pain in right knee: Secondary | ICD-10-CM | POA: Diagnosis not present

## 2021-05-11 DIAGNOSIS — R2681 Unsteadiness on feet: Secondary | ICD-10-CM | POA: Diagnosis not present

## 2021-05-11 DIAGNOSIS — E1165 Type 2 diabetes mellitus with hyperglycemia: Secondary | ICD-10-CM | POA: Diagnosis not present

## 2021-05-11 DIAGNOSIS — E1140 Type 2 diabetes mellitus with diabetic neuropathy, unspecified: Secondary | ICD-10-CM | POA: Diagnosis not present

## 2021-05-11 DIAGNOSIS — G4733 Obstructive sleep apnea (adult) (pediatric): Secondary | ICD-10-CM

## 2021-05-11 DIAGNOSIS — Z79899 Other long term (current) drug therapy: Secondary | ICD-10-CM

## 2021-05-11 DIAGNOSIS — Z7409 Other reduced mobility: Secondary | ICD-10-CM | POA: Diagnosis not present

## 2021-05-11 DIAGNOSIS — Z9989 Dependence on other enabling machines and devices: Secondary | ICD-10-CM

## 2021-05-11 DIAGNOSIS — R296 Repeated falls: Secondary | ICD-10-CM | POA: Diagnosis not present

## 2021-05-11 NOTE — Progress Notes (Signed)
Patient ID: James Spurr, James Bell, male    DOB: [birth year], 87 y.o.   MRN: [id number]  This visit was conducted in person.  BP 112/60   Pulse 68   Temp 98.1 F (36.7 C) (Temporal)   Ht '5\' 8"'$  (1.727 m)   Wt 194 lb (88 kg)   SpO2 99%   BMI 29.50 kg/m    CC: hosp f/u visit  Subjective:   HPI: James Spurr, James Bell is a 87 y.o. male presenting on 05/11/2021 for Hospitalization Follow-up   Recent hospitalization for acute onset generalized weakness with multiple falls. On arrival, suspicion for UTI. Imaging didn't reveal any fractures. CXR didn't show pneumonia. Treated for dehydration and UTI with IVF and IV rocephin, UCx returned insignificant growth. He did complete 3 days of IV rocephin. PT eval in hospital recommended SNF at discharge.   DM followed by endocrinology - lantus dose dropped from 50u to 40u, continued SSI. Saw [name] yesterday, note reviewed - trial farxiga '5mg'$  daily, monitor for yeast infection (occurred while on jardiance), continues novolog 15-20u with meals, ozempic increased to '2mg'$  weekly. Now back on tresiba 90u daily   OSA - continues CPAP nightly.   Had 4 falls prior to hospitalization - one of those falls landed R knee onto concrete. Had steroid injection ~03/2021 through [facility] - knee pain did improve but persists. Had reassuring knee xray at that time. Points to medial knee joint. No bruising, no locking. Some instability with ambulation. Managing pain with voltaren gel topically, no oral meds.   RLS - continues gabapentin '600mg'$  QID and mirapex '1mg'$  nightly. Hasn't been able to taper down gabapentin dose. [facility]NA recommended f/u with [facility] neuro for RLS - agrees to [facility] to [facility].   Continues PT through [facility] date: 03/31/2021 Discharge date: 04/06/2021 TCM hosp f/u phone call not performed. Discharged from [facility] after 2 weeks.  Has been at [facility] for the past 3 weeks.   Admitted From: ALF Disposition: SNF ([facility])  Discharge Diagnoses:  Active Problems:   History of CVA (cerebrovascular accident)   Type 2 diabetes mellitus, uncontrolled, with neuropathy (HCC)   OSA on CPAP   CKD stage 3 secondary to diabetes ([place])   Hyperlipidemia associated with type 2 diabetes mellitus (HCC)   Restless leg syndrome   GERD (gastroesophageal reflux disease)   Migraines   HTN (hypertension)   UTI (urinary tract infection)   Recommendations for Outpatient Follow-up:  Follow up with PCP in 1-2 weeks Please obtain BMP/CBC in one week   Discharge Condition: stable.  CODE STATUS:full code.  Diet recommendation: Heart Healthy / Carb Modified      Relevant past medical, surgical, family and social history reviewed and updated as indicated. Interim medical history since our last visit reviewed. Allergies and medications reviewed and updated. Outpatient Medications Prior to Visit  Medication Sig Dispense Refill   Alpha-Lipoic Acid 600 MG CAPS Take 1 capsule (600 mg total) by mouth daily. 30 capsule 11   BD PEN NEEDLE NANO 2ND GEN 32G X 4 MM MISC USE TO INJECT MEDICATION DAILY 100 each 3   brimonidine (ALPHAGAN) 0.2 % ophthalmic solution      clopidogrel (PLAVIX) 75 MG tablet TAKE 1 TABLET DAILY 90 tablet 3   Coenzyme Q10 (COQ10) 150 MG CAPS Take 1 capsule by mouth daily.     COMBIGAN 0.2-0.5 % ophthalmic solution      docusate sodium (COLACE) 100 MG  capsule Take 1 capsule (100 mg total) by mouth 2 (two) times daily as needed for mild constipation. 10 capsule 0   dorzolamide-timolol (COSOPT) 22.3-6.8 MG/ML ophthalmic solution      DULoxetine (CYMBALTA) 60 MG capsule TAKE 1 CAPSULE DAILY 90 capsule 3   famotidine (PEPCID) 20 MG tablet Take 1 tablet (20 mg total) by mouth at bedtime.     Fluocinolone Acetonide 0.01 % OIL      gabapentin (NEURONTIN) 300 MG capsule Take 600 mg by mouth 3 (three) times daily. 600 mg BID is his minimum dose without having breakthrough symptoms. He is aware 900 mg/day is  recommended per kidney function.     insulin aspart (NOVOLOG) 100 UNIT/ML injection CBG 70 - 120: 0 units  CBG 121 - 150: 1 unit  CBG 151 - 200: 2 units  CBG 201 - 250: 3 units  CBG 251 - 300: 5 units  CBG 301 - 350: 7 units  CBG 351 - 400: 9 units 10 mL 11   insulin glargine (LANTUS) 100 UNIT/ML injection Inject 0.4 mLs (40 Units total) into the skin at bedtime. 10 mL 11   ipratropium (ATROVENT) 0.03 % nasal spray      l-methylfolate-B6-B12 (METANX) 3-35-2 MG TABS tablet Take 1 tablet by mouth daily. 30 tablet 0   levothyroxine (SYNTHROID) 50 MCG tablet Take 1 tablet (50 mcg total) by mouth daily before breakfast. Per endo [name]     LUMIGAN 0.01 % SOLN Place 1 drop into the right eye at bedtime.     melatonin 5 MG TABS Take 5 mg by mouth at bedtime.     nitroGLYCERIN (NITROSTAT) 0.4 MG SL tablet DISSOLVE 1 TABLET UNDER THE TONGUE EVERY 5 MINUTES AS NEEDED FOR CHEST PAIN 25 tablet 1   ofloxacin (FLOXIN) 0.3 % OTIC solution      Omega-3 Fatty Acids (FISH OIL) 1000 MG CAPS Take 1 capsule (1,000 mg total) by mouth in the morning and at bedtime.  0   ondansetron (ZOFRAN) 4 MG tablet Take 1 tablet (4 mg total) by mouth every 8 (eight) hours as needed for nausea or vomiting. 30 tablet 0   polyethylene glycol (MIRALAX / GLYCOLAX) 17 g packet Take 17 g by mouth daily as needed. 14 each 0   pramipexole (MIRAPEX) 1 MG tablet Take 1 tablet (1 mg total) by mouth See admin instructions. At 4pm 90 tablet 1   RABEprazole (ACIPHEX) 20 MG tablet TAKE 1 TABLET DAILY 90 tablet 3   rosuvastatin (CRESTOR) 40 MG tablet Take 1 tablet (40 mg total) by mouth daily. 90 tablet 3   senna-docusate (SENOKOT-S) 8.6-50 MG tablet Take 1 tablet by mouth at bedtime. 30 tablet 0   tamsulosin (FLOMAX) 0.4 MG CAPS capsule Take 1 capsule (0.4 mg total) by mouth daily after breakfast. 90 capsule 1   vitamin C (ASCORBIC ACID) 500 MG tablet Take 1,000 mg by mouth daily as needed.     No facility-administered medications prior to  visit.     Per HPI unless specifically indicated in ROS section below Review of Systems  Objective:  BP 112/60   Pulse 68   Temp 98.1 F (36.7 C) (Temporal)   Ht '5\' 8"'$  (1.727 m)   Wt 194 lb (88 kg)   SpO2 99%   BMI 29.50 kg/m   Wt Readings from Last 3 Encounters:  05/11/21 194 lb (88 kg)  04/13/21 180 lb (81.6 kg)  03/31/21 185 lb (83.9 kg)  Physical Exam Vitals and nursing note reviewed.  Constitutional:      Appearance: Normal appearance. He is not ill-appearing.     Comments: Ambulates with rollator  Cardiovascular:     Rate and Rhythm: Normal rate and regular rhythm.     Pulses: Normal pulses.     Heart sounds: Normal heart sounds. No murmur heard. Pulmonary:     Effort: Pulmonary effort is normal. No respiratory distress.     Breath sounds: Normal breath sounds. No wheezing, rhonchi or rales.  Musculoskeletal:     Right lower leg: No edema.     Left lower leg: No edema.     Comments:  R ankle foot drop brace present L knee WNL R knee exam: Abrasion to lateral anterior leg No deformity on inspection. Reproducible tenderness with palpation of medial knee joint line. Mild swelling noted medial knee. FROM in flex/extension without crepitus. No popliteal fullness. No pain at pes anserine bursa Neg drawer test. Discomfort with mcmurray test. No pain with valgus/varus stress. No PFgrind. No abnormal patellar mobility.   Skin:    General: Skin is warm and dry.     Findings: No rash.  Neurological:     Mental Status: He is alert.  Psychiatric:        Mood and Affect: Mood normal.        Behavior: Behavior normal.      Results for orders placed or performed in visit on 04/13/21  Bladder Scan (Post Void Residual) in office  Result Value Ref Range   Scan Result 138m    *Note: Due to a large number of results and/or encounters for the requested time period, some results have not been displayed. A complete set of results can be found in Results Review.    Lab Results  Component Value Date   HGBA1C 8.5 (H) 04/02/2021    Assessment & Plan:  This visit occurred during the SARS-CoV-2 public health emergency.  Safety protocols were in place, including screening questions prior to the visit, additional usage of staff PPE, and extensive cleaning of exam room while observing appropriate contact time as indicated for disinfecting solutions.   Problem List Items Addressed This Visit     Hemiparesis affecting right side as late effect of stroke ([place]    [facility]       Type 2 diabetes mellitus, uncontrolled, with neuropathy (HCC)    Recently saw Dr KBuddy Dutyendo, ozempic dose increased to '2mg'$  weekly.       OSA on CPAP    [facility]Dr ARexene Alberts MWard Givens      Restless leg syndrome    Ongoing struggle with severe symptoms.  He has been unable to taper gabapentin dose down.  Also continues mirapex '1mg'$  nightly.  Off oxycodone - as this contributed to AMS and hospitalization earlier this year.  [facility] requests assistance to schedule f/u - will re-refer      Relevant Orders   Ambulatory referral to Neurology   Impaired mobility   Gait disturbance, post-stroke   Polypharmacy   General weakness - Primary    Recent hospitalization for weakness and falls - this improved with IVF rehydration. He did stay 2 weeks at SNF. [facility]       Right medial knee pain    Suffered after fall - saw ortho s/p knee steroid injection with benefit. Exam today  suspicious for meniscal injury - will recommend voltaren gel topically and knee brace use for support. If ongoing, discussed return to ortho for further evaluation.         No orders of the defined types were placed in this encounter.  Orders Placed This Encounter  Procedures   Ambulatory referral to Neurology    Referral Priority:   Routine    Referral Type:    Consultation    Referral Reason:   Specialty Services Required    Requested Specialty:   Neurology    Number of Visits Requested:   1      Patient Instructions  I will place referral back to [facility] neurology For right knee - continue voltaren gel three times daily for next 2 weeks, consider buying knee sleeve brace for extra support, return to ortho if not better with this.  Continue physical therapy.  Keep upcoming appointment next month.   Follow up plan: Return if symptoms worsen or fail to improve.  [name], James Bell

## 2021-05-11 NOTE — Patient Instructions (Addendum)
I will place referral back to [facility] neurology For right knee - continue voltaren gel three times daily for next 2 weeks, consider buying knee sleeve brace for extra support, return to ortho if not better with this.  Continue physical therapy.  Keep upcoming appointment next month.

## 2021-05-12 DIAGNOSIS — R296 Repeated falls: Secondary | ICD-10-CM | POA: Diagnosis not present

## 2021-05-12 DIAGNOSIS — R279 Unspecified lack of coordination: Secondary | ICD-10-CM | POA: Diagnosis not present

## 2021-05-12 DIAGNOSIS — R531 Weakness: Secondary | ICD-10-CM | POA: Insufficient documentation

## 2021-05-12 DIAGNOSIS — M25561 Pain in right knee: Secondary | ICD-10-CM | POA: Insufficient documentation

## 2021-05-12 DIAGNOSIS — M6281 Muscle weakness (generalized): Secondary | ICD-10-CM | POA: Diagnosis not present

## 2021-05-12 DIAGNOSIS — R2681 Unsteadiness on feet: Secondary | ICD-10-CM | POA: Diagnosis not present

## 2021-05-12 NOTE — Assessment & Plan Note (Signed)
Recent hospitalization for weakness and falls - this improved with IVF rehydration. He did stay 2 weeks at SNF. He now is receiving physical therapy through [facility].

## 2021-05-12 NOTE — Assessment & Plan Note (Signed)
Continue PT at [facility].

## 2021-05-12 NOTE — Assessment & Plan Note (Addendum)
Ongoing struggle with severe symptoms.  He has been unable to taper gabapentin dose down.  Also continues mirapex '1mg'$  nightly.  Off oxycodone - as this contributed to AMS and hospitalization earlier this year.  Discussed return to [facility] Neurology, pt requests assistance to schedule f/u - will re-refer

## 2021-05-12 NOTE — Assessment & Plan Note (Signed)
Suffered after fall - saw ortho s/p knee steroid injection with benefit. Exam today suspicious for meniscal injury - will recommend voltaren gel topically and knee brace use for support. If ongoing, discussed return to ortho for further evaluation.

## 2021-05-12 NOTE — Assessment & Plan Note (Signed)
Recently saw [name] endo, ozempic dose increased to '2mg'$  weekly.

## 2021-05-12 NOTE — Assessment & Plan Note (Signed)
This is followed by [facility] (Dr Rexene Alberts, Ward Givens)

## 2021-05-13 DIAGNOSIS — M6281 Muscle weakness (generalized): Secondary | ICD-10-CM | POA: Diagnosis not present

## 2021-05-13 DIAGNOSIS — R279 Unspecified lack of coordination: Secondary | ICD-10-CM | POA: Diagnosis not present

## 2021-05-13 DIAGNOSIS — I69398 Other sequelae of cerebral infarction: Secondary | ICD-10-CM | POA: Diagnosis not present

## 2021-05-13 DIAGNOSIS — R296 Repeated falls: Secondary | ICD-10-CM | POA: Diagnosis not present

## 2021-05-16 DIAGNOSIS — I69398 Other sequelae of cerebral infarction: Secondary | ICD-10-CM | POA: Diagnosis not present

## 2021-05-16 DIAGNOSIS — M6281 Muscle weakness (generalized): Secondary | ICD-10-CM | POA: Diagnosis not present

## 2021-05-16 DIAGNOSIS — M25561 Pain in right knee: Secondary | ICD-10-CM | POA: Diagnosis not present

## 2021-05-16 DIAGNOSIS — R279 Unspecified lack of coordination: Secondary | ICD-10-CM | POA: Diagnosis not present

## 2021-05-17 ENCOUNTER — Ambulatory Visit: Payer: Self-pay | Admitting: Physician Assistant

## 2021-05-17 DIAGNOSIS — R279 Unspecified lack of coordination: Secondary | ICD-10-CM | POA: Diagnosis not present

## 2021-05-17 DIAGNOSIS — M6281 Muscle weakness (generalized): Secondary | ICD-10-CM | POA: Diagnosis not present

## 2021-05-17 DIAGNOSIS — R2681 Unsteadiness on feet: Secondary | ICD-10-CM | POA: Diagnosis not present

## 2021-05-17 DIAGNOSIS — M25561 Pain in right knee: Secondary | ICD-10-CM | POA: Diagnosis not present

## 2021-05-17 DIAGNOSIS — R296 Repeated falls: Secondary | ICD-10-CM | POA: Diagnosis not present

## 2021-05-18 ENCOUNTER — Telehealth: Payer: Self-pay

## 2021-05-18 DIAGNOSIS — I69398 Other sequelae of cerebral infarction: Secondary | ICD-10-CM | POA: Diagnosis not present

## 2021-05-18 DIAGNOSIS — R2681 Unsteadiness on feet: Secondary | ICD-10-CM | POA: Diagnosis not present

## 2021-05-18 DIAGNOSIS — M25561 Pain in right knee: Secondary | ICD-10-CM | POA: Diagnosis not present

## 2021-05-18 DIAGNOSIS — R296 Repeated falls: Secondary | ICD-10-CM | POA: Diagnosis not present

## 2021-05-18 DIAGNOSIS — R279 Unspecified lack of coordination: Secondary | ICD-10-CM | POA: Diagnosis not present

## 2021-05-18 DIAGNOSIS — M6281 Muscle weakness (generalized): Secondary | ICD-10-CM | POA: Diagnosis not present

## 2021-05-18 NOTE — Telephone Encounter (Signed)
207 pm.  Phone call made to patient to schedule a home visit as requested by Hanley Ben, NP.  No answer but message has been left requesting a call back.

## 2021-05-19 DIAGNOSIS — M6281 Muscle weakness (generalized): Secondary | ICD-10-CM | POA: Diagnosis not present

## 2021-05-19 DIAGNOSIS — R279 Unspecified lack of coordination: Secondary | ICD-10-CM | POA: Diagnosis not present

## 2021-05-19 DIAGNOSIS — M25561 Pain in right knee: Secondary | ICD-10-CM | POA: Diagnosis not present

## 2021-05-19 DIAGNOSIS — R296 Repeated falls: Secondary | ICD-10-CM | POA: Diagnosis not present

## 2021-05-19 DIAGNOSIS — R2681 Unsteadiness on feet: Secondary | ICD-10-CM | POA: Diagnosis not present

## 2021-05-20 ENCOUNTER — Encounter: Payer: Self-pay | Admitting: Physician Assistant

## 2021-05-20 DIAGNOSIS — M6281 Muscle weakness (generalized): Secondary | ICD-10-CM | POA: Diagnosis not present

## 2021-05-20 DIAGNOSIS — R279 Unspecified lack of coordination: Secondary | ICD-10-CM | POA: Diagnosis not present

## 2021-05-20 DIAGNOSIS — R296 Repeated falls: Secondary | ICD-10-CM | POA: Diagnosis not present

## 2021-05-20 DIAGNOSIS — I69398 Other sequelae of cerebral infarction: Secondary | ICD-10-CM | POA: Diagnosis not present

## 2021-05-24 DIAGNOSIS — R296 Repeated falls: Secondary | ICD-10-CM | POA: Diagnosis not present

## 2021-05-24 DIAGNOSIS — M6281 Muscle weakness (generalized): Secondary | ICD-10-CM | POA: Diagnosis not present

## 2021-05-24 DIAGNOSIS — R279 Unspecified lack of coordination: Secondary | ICD-10-CM | POA: Diagnosis not present

## 2021-05-24 DIAGNOSIS — I69398 Other sequelae of cerebral infarction: Secondary | ICD-10-CM | POA: Diagnosis not present

## 2021-05-24 DIAGNOSIS — M25561 Pain in right knee: Secondary | ICD-10-CM | POA: Diagnosis not present

## 2021-05-24 DIAGNOSIS — R2681 Unsteadiness on feet: Secondary | ICD-10-CM | POA: Diagnosis not present

## 2021-05-25 DIAGNOSIS — R296 Repeated falls: Secondary | ICD-10-CM | POA: Diagnosis not present

## 2021-05-25 DIAGNOSIS — R279 Unspecified lack of coordination: Secondary | ICD-10-CM | POA: Diagnosis not present

## 2021-05-25 DIAGNOSIS — M6281 Muscle weakness (generalized): Secondary | ICD-10-CM | POA: Diagnosis not present

## 2021-05-25 DIAGNOSIS — I69398 Other sequelae of cerebral infarction: Secondary | ICD-10-CM | POA: Diagnosis not present

## 2021-05-26 DIAGNOSIS — I69398 Other sequelae of cerebral infarction: Secondary | ICD-10-CM | POA: Diagnosis not present

## 2021-05-26 DIAGNOSIS — R279 Unspecified lack of coordination: Secondary | ICD-10-CM | POA: Diagnosis not present

## 2021-05-26 DIAGNOSIS — R296 Repeated falls: Secondary | ICD-10-CM | POA: Diagnosis not present

## 2021-05-26 DIAGNOSIS — M25561 Pain in right knee: Secondary | ICD-10-CM | POA: Diagnosis not present

## 2021-05-26 DIAGNOSIS — R2681 Unsteadiness on feet: Secondary | ICD-10-CM | POA: Diagnosis not present

## 2021-05-26 DIAGNOSIS — M6281 Muscle weakness (generalized): Secondary | ICD-10-CM | POA: Diagnosis not present

## 2021-05-27 DIAGNOSIS — M6281 Muscle weakness (generalized): Secondary | ICD-10-CM | POA: Diagnosis not present

## 2021-05-27 DIAGNOSIS — M25561 Pain in right knee: Secondary | ICD-10-CM | POA: Diagnosis not present

## 2021-05-27 DIAGNOSIS — R279 Unspecified lack of coordination: Secondary | ICD-10-CM | POA: Diagnosis not present

## 2021-05-27 DIAGNOSIS — R2681 Unsteadiness on feet: Secondary | ICD-10-CM | POA: Diagnosis not present

## 2021-05-27 DIAGNOSIS — R296 Repeated falls: Secondary | ICD-10-CM | POA: Diagnosis not present

## 2021-05-28 ENCOUNTER — Other Ambulatory Visit: Payer: Self-pay | Admitting: Family Medicine

## 2021-05-30 DIAGNOSIS — R279 Unspecified lack of coordination: Secondary | ICD-10-CM | POA: Diagnosis not present

## 2021-05-30 DIAGNOSIS — I69398 Other sequelae of cerebral infarction: Secondary | ICD-10-CM | POA: Diagnosis not present

## 2021-05-30 DIAGNOSIS — M6281 Muscle weakness (generalized): Secondary | ICD-10-CM | POA: Diagnosis not present

## 2021-05-30 DIAGNOSIS — R296 Repeated falls: Secondary | ICD-10-CM | POA: Diagnosis not present

## 2021-05-31 ENCOUNTER — Telehealth: Payer: Self-pay

## 2021-05-31 DIAGNOSIS — M25561 Pain in right knee: Secondary | ICD-10-CM | POA: Diagnosis not present

## 2021-05-31 DIAGNOSIS — M6281 Muscle weakness (generalized): Secondary | ICD-10-CM | POA: Diagnosis not present

## 2021-05-31 DIAGNOSIS — R279 Unspecified lack of coordination: Secondary | ICD-10-CM | POA: Diagnosis not present

## 2021-05-31 DIAGNOSIS — R2681 Unsteadiness on feet: Secondary | ICD-10-CM | POA: Diagnosis not present

## 2021-05-31 DIAGNOSIS — R296 Repeated falls: Secondary | ICD-10-CM | POA: Diagnosis not present

## 2021-05-31 NOTE — Chronic Care Management (AMB) (Addendum)
Chronic Care Management Pharmacy Assistant   Name: James ENSEY, MD  MRN: [id number] DOB: 1931/06/06  Reason for Encounter: HTN Review   Recent office visits:  05/11/21-PCP-Patient presented for follow up after hospitalizaiton. Continue PT. Refer to Neurology. Exam today suspicious for right knee meniscal injury. Will recommend voltaren gel topically and knee brace use for support.   Recent consult visits:  04/13/21-Urology-Patient presented for follow up bladder issues, bladder scan ordered,Rx Diflucan 100 mg daily x5 days,schedule cystoscopy.Follow up 4 weeks  Hospital visits:  03/31/21 thru 04/06/21 [facility] admission s/p UTI with Fall-discharged to SNF,repeat labs one week.  Medications: Outpatient Encounter Medications as of 05/31/2021  Medication Sig   Alpha-Lipoic Acid 600 MG CAPS Take 1 capsule (600 mg total) by mouth daily.   BD PEN NEEDLE NANO 2ND GEN 32G X 4 MM MISC USE TO INJECT MEDICATION DAILY   brimonidine (ALPHAGAN) 0.2 % ophthalmic solution    clopidogrel (PLAVIX) 75 MG tablet TAKE 1 TABLET DAILY   Coenzyme Q10 (COQ10) 150 MG CAPS Take 1 capsule by mouth daily.   COMBIGAN 0.2-0.5 % ophthalmic solution    docusate sodium (COLACE) 100 MG capsule Take 1 capsule (100 mg total) by mouth 2 (two) times daily as needed for mild constipation.   dorzolamide-timolol (COSOPT) 22.3-6.8 MG/ML ophthalmic solution    DULoxetine (CYMBALTA) 60 MG capsule TAKE 1 CAPSULE DAILY   famotidine (PEPCID) 20 MG tablet Take 1 tablet (20 mg total) by mouth at bedtime.   Fluocinolone Acetonide 0.01 % OIL    gabapentin (NEURONTIN) 300 MG capsule Take 600 mg by mouth 3 (three) times daily. 600 mg BID is his minimum dose without having breakthrough symptoms. He is aware 900 mg/day is recommended per kidney function.   insulin aspart (NOVOLOG) 100 UNIT/ML injection CBG 70 - 120: 0 units  CBG 121 - 150: 1 unit  CBG 151 - 200: 2 units  CBG 201 - 250: 3 units  CBG 251 - 300: 5 units  CBG 301 - 350:  7 units  CBG 351 - 400: 9 units   insulin glargine (LANTUS) 100 UNIT/ML injection Inject 0.4 mLs (40 Units total) into the skin at bedtime.   ipratropium (ATROVENT) 0.03 % nasal spray    l-methylfolate-B6-B12 (METANX) 3-35-2 MG TABS tablet Take 1 tablet by mouth daily.   levothyroxine (SYNTHROID) 50 MCG tablet Take 1 tablet (50 mcg total) by mouth daily before breakfast. Per endo [name]   LUMIGAN 0.01 % SOLN Place 1 drop into the right eye at bedtime.   melatonin 5 MG TABS Take 5 mg by mouth at bedtime.   nitroGLYCERIN (NITROSTAT) 0.4 MG SL tablet DISSOLVE 1 TABLET UNDER THE TONGUE EVERY 5 MINUTES AS NEEDED FOR CHEST PAIN   ofloxacin (FLOXIN) 0.3 % OTIC solution    Omega-3 Fatty Acids (FISH OIL) 1000 MG CAPS Take 1 capsule (1,000 mg total) by mouth in the morning and at bedtime.   ondansetron (ZOFRAN) 4 MG tablet Take 1 tablet (4 mg total) by mouth every 8 (eight) hours as needed for nausea or vomiting.   polyethylene glycol (MIRALAX / GLYCOLAX) 17 g packet Take 17 g by mouth daily as needed.   pramipexole (MIRAPEX) 1 MG tablet Take 1 tablet (1 mg total) by mouth See admin instructions. At 4pm   RABEprazole (ACIPHEX) 20 MG tablet TAKE 1 TABLET DAILY   rosuvastatin (CRESTOR) 40 MG tablet TAKE 1 TABLET BY MOUTH ONCE DAILY   senna-docusate (SENOKOT-S) 8.6-50 MG tablet Take  1 tablet by mouth at bedtime.   tamsulosin (FLOMAX) 0.4 MG CAPS capsule Take 1 capsule (0.4 mg total) by mouth daily after breakfast.   vitamin C (ASCORBIC ACID) 500 MG tablet Take 1,000 mg by mouth daily as needed.   No facility-administered encounter medications on file as of 05/31/2021.     Recent Office Vitals: BP Readings from Last 3 Encounters:  05/11/21 112/60  04/13/21 (!) 117/49  04/06/21 123/65   Pulse Readings from Last 3 Encounters:  05/11/21 68  04/13/21 70  04/06/21 74    Wt Readings from Last 3 Encounters:  05/11/21 194 lb (88 kg)  04/13/21 180 lb (81.6 kg)  03/31/21 185 lb (83.9 kg)     Kidney  Function Lab Results  Component Value Date/Time   CREATININE 1.29 (H) 04/03/2021 04:45 AM   CREATININE 1.31 (H) 04/02/2021 10:43 AM   GFR 41.64 (L) 11/10/2020 01:08 PM   GFRNONAA 54 (L) 04/03/2021 04:45 AM   GFRAA 54 (L) 04/21/2020 02:00 PM    BMP Latest Ref Rng & Units 04/03/2021 04/02/2021 04/01/2021  Glucose 70 - 99 mg/dL 75 134(H) 74  BUN 8 - 23 mg/dL 26(H) 23 23  Creatinine 0.61 - 1.24 mg/dL 1.29(H) 1.31(H) 1.37(H)  Sodium 135 - 145 mmol/L 137 136 139  Potassium 3.5 - 5.1 mmol/L 3.9 3.6 3.6  Chloride 98 - 111 mmol/L 106 106 108  CO2 22 - 32 mmol/L '25 24 27  '$ Calcium 8.9 - 10.3 mg/dL 8.0(L) 7.9(L) 8.3(L)     Contacted patient on 06/02/21 to discuss hypertension disease state  Current antihypertensive regimen:  Metoprolol tartrate 25 mg - 1/2 tablet at bedtime as needed for tachycardia  Patient verbally confirms he is taking the above medications as directed. Yes The patient reports he only uses 1/2 tablet as needed and he has not had to use lately.  How often are you checking your Blood Pressure? daily The RN  where he is living checks his BP periodically  Current home BP readings: 06/02/21  120/60  78-P  Wrist or arm cuff: arm cuff Caffeine intake:decaffinated coffee in the am  Salt intake: the patient reports he eats 3 meals a day served where he lives and he does not add salt to the food. OTC medications including pseudoephedrine or NSAIDs? no  Any readings above 180/120? No  What recent interventions/DTPs have been made by any provider to improve Blood Pressure control since last CPP Visit: none identified  Any recent hospitalizations or ED visits since last visit with CPP?  03/31/21 thru 04/06/21 [facility] admission s/p UTI with Fall-discharged to SNF,repeat labs one week. What diet changes have been made to improve Blood Pressure Control? The patient reports not adding salt to foods  What exercise is being done to improve your Blood Pressure Control? Minimal at this time due  to right knee pain and possible MRI upcoming.   Adherence Review: Is the patient currently on ACE/ARB medication? No Does the patient have >5 day gap between last estimated fill dates? No gap identified  Star Rating Drugs:  Medication:  Last Fill: Day Supply Rosuvastatin '40mg'$  05/30/21 90 Lantus Novolog  03/25/21  - Farxiga '5mg'$   05/10/21 30 Tresiba   04/07/21 Humalog  05/24/21  29 Ozempic  05/24/21  28  Care Gaps: Annual wellness visit in last year? Sched 06/13/21 Most Recent BP reading:112/60  68-P (05/11/21)  If Diabetic: Most recent A1C reading:8.5 (04/02/21) Last eye exam / retinopathy screening: 11/23/20 Last diabetic foot exam: 12/30/20  PCP appointment on 06/13/21  [name], CPP notified  [name], [place] Assistant [phone]  I have reviewed the care management and care coordination activities outlined in this encounter and I am certifying that I agree with the content of this note. No further action required.  [name], PharmD Clinical Pharmacist [facility] at [facility] [phone]

## 2021-06-01 DIAGNOSIS — M6281 Muscle weakness (generalized): Secondary | ICD-10-CM | POA: Diagnosis not present

## 2021-06-01 DIAGNOSIS — R296 Repeated falls: Secondary | ICD-10-CM | POA: Diagnosis not present

## 2021-06-01 DIAGNOSIS — R279 Unspecified lack of coordination: Secondary | ICD-10-CM | POA: Diagnosis not present

## 2021-06-01 DIAGNOSIS — I69398 Other sequelae of cerebral infarction: Secondary | ICD-10-CM | POA: Diagnosis not present

## 2021-06-02 DIAGNOSIS — M6281 Muscle weakness (generalized): Secondary | ICD-10-CM | POA: Diagnosis not present

## 2021-06-02 DIAGNOSIS — R2681 Unsteadiness on feet: Secondary | ICD-10-CM | POA: Diagnosis not present

## 2021-06-02 DIAGNOSIS — R279 Unspecified lack of coordination: Secondary | ICD-10-CM | POA: Diagnosis not present

## 2021-06-02 DIAGNOSIS — M25561 Pain in right knee: Secondary | ICD-10-CM | POA: Diagnosis not present

## 2021-06-02 DIAGNOSIS — R296 Repeated falls: Secondary | ICD-10-CM | POA: Diagnosis not present

## 2021-06-02 DIAGNOSIS — I69398 Other sequelae of cerebral infarction: Secondary | ICD-10-CM | POA: Diagnosis not present

## 2021-06-03 DIAGNOSIS — R279 Unspecified lack of coordination: Secondary | ICD-10-CM | POA: Diagnosis not present

## 2021-06-03 DIAGNOSIS — M25561 Pain in right knee: Secondary | ICD-10-CM | POA: Diagnosis not present

## 2021-06-03 DIAGNOSIS — R2681 Unsteadiness on feet: Secondary | ICD-10-CM | POA: Diagnosis not present

## 2021-06-03 DIAGNOSIS — M6281 Muscle weakness (generalized): Secondary | ICD-10-CM | POA: Diagnosis not present

## 2021-06-03 DIAGNOSIS — R296 Repeated falls: Secondary | ICD-10-CM | POA: Diagnosis not present

## 2021-06-04 ENCOUNTER — Other Ambulatory Visit: Payer: Self-pay | Admitting: Family Medicine

## 2021-06-04 DIAGNOSIS — G2581 Restless legs syndrome: Secondary | ICD-10-CM

## 2021-06-04 DIAGNOSIS — E1169 Type 2 diabetes mellitus with other specified complication: Secondary | ICD-10-CM

## 2021-06-04 DIAGNOSIS — E785 Hyperlipidemia, unspecified: Secondary | ICD-10-CM

## 2021-06-04 DIAGNOSIS — E1140 Type 2 diabetes mellitus with diabetic neuropathy, unspecified: Secondary | ICD-10-CM

## 2021-06-04 DIAGNOSIS — N183 Chronic kidney disease, stage 3 unspecified: Secondary | ICD-10-CM

## 2021-06-04 DIAGNOSIS — IMO0002 Reserved for concepts with insufficient information to code with codable children: Secondary | ICD-10-CM

## 2021-06-06 ENCOUNTER — Other Ambulatory Visit: Payer: Medicare Other

## 2021-06-06 DIAGNOSIS — I69398 Other sequelae of cerebral infarction: Secondary | ICD-10-CM | POA: Diagnosis not present

## 2021-06-06 DIAGNOSIS — R279 Unspecified lack of coordination: Secondary | ICD-10-CM | POA: Diagnosis not present

## 2021-06-06 DIAGNOSIS — M6281 Muscle weakness (generalized): Secondary | ICD-10-CM | POA: Diagnosis not present

## 2021-06-06 DIAGNOSIS — R296 Repeated falls: Secondary | ICD-10-CM | POA: Diagnosis not present

## 2021-06-07 DIAGNOSIS — M6281 Muscle weakness (generalized): Secondary | ICD-10-CM | POA: Diagnosis not present

## 2021-06-07 DIAGNOSIS — R2681 Unsteadiness on feet: Secondary | ICD-10-CM | POA: Diagnosis not present

## 2021-06-07 DIAGNOSIS — M25561 Pain in right knee: Secondary | ICD-10-CM | POA: Diagnosis not present

## 2021-06-07 DIAGNOSIS — R279 Unspecified lack of coordination: Secondary | ICD-10-CM | POA: Diagnosis not present

## 2021-06-07 DIAGNOSIS — R296 Repeated falls: Secondary | ICD-10-CM | POA: Diagnosis not present

## 2021-06-08 ENCOUNTER — Ambulatory Visit: Payer: Medicare Other

## 2021-06-08 DIAGNOSIS — I69398 Other sequelae of cerebral infarction: Secondary | ICD-10-CM | POA: Diagnosis not present

## 2021-06-08 DIAGNOSIS — R279 Unspecified lack of coordination: Secondary | ICD-10-CM | POA: Diagnosis not present

## 2021-06-08 DIAGNOSIS — G2581 Restless legs syndrome: Secondary | ICD-10-CM | POA: Diagnosis not present

## 2021-06-08 DIAGNOSIS — R296 Repeated falls: Secondary | ICD-10-CM | POA: Diagnosis not present

## 2021-06-08 DIAGNOSIS — R2681 Unsteadiness on feet: Secondary | ICD-10-CM | POA: Diagnosis not present

## 2021-06-08 DIAGNOSIS — R269 Unspecified abnormalities of gait and mobility: Secondary | ICD-10-CM | POA: Diagnosis not present

## 2021-06-08 DIAGNOSIS — I69351 Hemiplegia and hemiparesis following cerebral infarction affecting right dominant side: Secondary | ICD-10-CM | POA: Diagnosis not present

## 2021-06-08 DIAGNOSIS — G8110 Spastic hemiplegia affecting unspecified side: Secondary | ICD-10-CM | POA: Diagnosis not present

## 2021-06-08 DIAGNOSIS — M6281 Muscle weakness (generalized): Secondary | ICD-10-CM | POA: Diagnosis not present

## 2021-06-08 DIAGNOSIS — M25561 Pain in right knee: Secondary | ICD-10-CM | POA: Diagnosis not present

## 2021-06-08 DIAGNOSIS — E1140 Type 2 diabetes mellitus with diabetic neuropathy, unspecified: Secondary | ICD-10-CM | POA: Diagnosis not present

## 2021-06-09 DIAGNOSIS — R296 Repeated falls: Secondary | ICD-10-CM | POA: Diagnosis not present

## 2021-06-09 DIAGNOSIS — I69398 Other sequelae of cerebral infarction: Secondary | ICD-10-CM | POA: Diagnosis not present

## 2021-06-09 DIAGNOSIS — M6281 Muscle weakness (generalized): Secondary | ICD-10-CM | POA: Diagnosis not present

## 2021-06-09 DIAGNOSIS — R279 Unspecified lack of coordination: Secondary | ICD-10-CM | POA: Diagnosis not present

## 2021-06-10 DIAGNOSIS — M6281 Muscle weakness (generalized): Secondary | ICD-10-CM | POA: Diagnosis not present

## 2021-06-10 DIAGNOSIS — R296 Repeated falls: Secondary | ICD-10-CM | POA: Diagnosis not present

## 2021-06-10 DIAGNOSIS — M25561 Pain in right knee: Secondary | ICD-10-CM | POA: Diagnosis not present

## 2021-06-10 DIAGNOSIS — R279 Unspecified lack of coordination: Secondary | ICD-10-CM | POA: Diagnosis not present

## 2021-06-10 DIAGNOSIS — R2681 Unsteadiness on feet: Secondary | ICD-10-CM | POA: Diagnosis not present

## 2021-06-13 ENCOUNTER — Ambulatory Visit (INDEPENDENT_AMBULATORY_CARE_PROVIDER_SITE_OTHER): Payer: Medicare Other | Admitting: Family Medicine

## 2021-06-13 ENCOUNTER — Encounter: Payer: Self-pay | Admitting: Family Medicine

## 2021-06-13 ENCOUNTER — Other Ambulatory Visit: Payer: Self-pay

## 2021-06-13 VITALS — BP 128/72 | HR 72 | Temp 97.5°F | Ht 67.25 in | Wt 184.6 lb

## 2021-06-13 DIAGNOSIS — N183 Chronic kidney disease, stage 3 unspecified: Secondary | ICD-10-CM

## 2021-06-13 DIAGNOSIS — I69351 Hemiplegia and hemiparesis following cerebral infarction affecting right dominant side: Secondary | ICD-10-CM

## 2021-06-13 DIAGNOSIS — Z23 Encounter for immunization: Secondary | ICD-10-CM

## 2021-06-13 DIAGNOSIS — E1140 Type 2 diabetes mellitus with diabetic neuropathy, unspecified: Secondary | ICD-10-CM | POA: Diagnosis not present

## 2021-06-13 DIAGNOSIS — K219 Gastro-esophageal reflux disease without esophagitis: Secondary | ICD-10-CM

## 2021-06-13 DIAGNOSIS — R2681 Unsteadiness on feet: Secondary | ICD-10-CM | POA: Diagnosis not present

## 2021-06-13 DIAGNOSIS — E1169 Type 2 diabetes mellitus with other specified complication: Secondary | ICD-10-CM

## 2021-06-13 DIAGNOSIS — E1122 Type 2 diabetes mellitus with diabetic chronic kidney disease: Secondary | ICD-10-CM | POA: Diagnosis not present

## 2021-06-13 DIAGNOSIS — Z79899 Other long term (current) drug therapy: Secondary | ICD-10-CM

## 2021-06-13 DIAGNOSIS — R296 Repeated falls: Secondary | ICD-10-CM | POA: Diagnosis not present

## 2021-06-13 DIAGNOSIS — F331 Major depressive disorder, recurrent, moderate: Secondary | ICD-10-CM

## 2021-06-13 DIAGNOSIS — G2581 Restless legs syndrome: Secondary | ICD-10-CM

## 2021-06-13 DIAGNOSIS — E1165 Type 2 diabetes mellitus with hyperglycemia: Secondary | ICD-10-CM | POA: Diagnosis not present

## 2021-06-13 DIAGNOSIS — N138 Other obstructive and reflux uropathy: Secondary | ICD-10-CM

## 2021-06-13 DIAGNOSIS — Z7189 Other specified counseling: Secondary | ICD-10-CM

## 2021-06-13 DIAGNOSIS — E785 Hyperlipidemia, unspecified: Secondary | ICD-10-CM

## 2021-06-13 DIAGNOSIS — D696 Thrombocytopenia, unspecified: Secondary | ICD-10-CM

## 2021-06-13 DIAGNOSIS — G8110 Spastic hemiplegia affecting unspecified side: Secondary | ICD-10-CM

## 2021-06-13 DIAGNOSIS — R279 Unspecified lack of coordination: Secondary | ICD-10-CM | POA: Diagnosis not present

## 2021-06-13 DIAGNOSIS — IMO0002 Reserved for concepts with insufficient information to code with codable children: Secondary | ICD-10-CM

## 2021-06-13 DIAGNOSIS — I10 Essential (primary) hypertension: Secondary | ICD-10-CM

## 2021-06-13 DIAGNOSIS — M6281 Muscle weakness (generalized): Secondary | ICD-10-CM | POA: Diagnosis not present

## 2021-06-13 DIAGNOSIS — Z0000 Encounter for general adult medical examination without abnormal findings: Secondary | ICD-10-CM | POA: Diagnosis not present

## 2021-06-13 DIAGNOSIS — M25561 Pain in right knee: Secondary | ICD-10-CM | POA: Diagnosis not present

## 2021-06-13 DIAGNOSIS — Z8673 Personal history of transient ischemic attack (TIA), and cerebral infarction without residual deficits: Secondary | ICD-10-CM

## 2021-06-13 DIAGNOSIS — I69398 Other sequelae of cerebral infarction: Secondary | ICD-10-CM | POA: Diagnosis not present

## 2021-06-13 NOTE — Progress Notes (Signed)
Patient ID: James Spurr, James Bell, male    DOB: [birth year], 87 y.o.   MRN: [id number]  This visit was conducted in person.  BP 128/72   Pulse 72   Temp (!) 97.5 F (36.4 C) (Temporal)   Ht 5' 7.25" (1.708 m)   Wt 184 lb 9 oz (83.7 kg)   SpO2 97%   BMI 28.69 kg/m    CC: AMW/CPE Subjective:   HPI: James Spurr, James Bell is a 87 y.o. male presenting on 06/13/2021 for Medicare Wellness   Did not see health advisor this year.   Hearing Screening   500Hz  1000Hz  2000Hz  4000Hz   Right ear 25 25 0 0  Left ear 25 25 40 0  Comments: Wears bilateral hearing aids.  Not wearing at today's OV.   Vision Screening - Comments:: Last eye exam, 11/2020.  [place] Office Visit from 06/13/2021 in [place] [facility] [place]  PHQ-2 Total Score 3       Fall Risk  06/13/2021 06/01/2020 04/22/2020 09/09/2019 03/11/2019  Falls in the past year? 1 1 1 1 1   Comment - - - - -  Number falls in past yr: 1 1 1 1 1   Comment - 4x - - -  Injury with Fall? 0 0 0 0 1  Comment - - - - -  Risk Factor Category  - - - - -  Comment - - - - -  Risk for fall due to : - - - - -  Risk for fall due to: Comment - - - - -  Follow up - - - - -  Comment - - - - -    Continues living in [place] in [place].  H/o L subcortical infarct with resultant chronic R hemiparesis 2013.    Severe RLS - saw neurology Dr Jeraldine Loots, s/p iron infusion through [facility] 2021. Recommended gabapentin max dose of 700mg  bid. He's taking 600mg  TID, lower doses cause breakthrough severe RLS symptoms.   DM - has been using [employer] with significant benefit. Cbg's running 200-300s which is an improvement from prior. He did run out of sensor recently, awaiting new supply from [employer] - considering switching companies.   Requests new Rx for ASO brace through local office, will bring in contact info.   Preventative: COLONOSCOPY Date: 06/2011 mult polyps adenomatous, severe diverticulosis, rpt 3 yrs [name]). Pt has  decided to defer.  Prostate - followed by urology  Lung cancer screening - not eligible  Flu shot today  [place] 09/2019, 10/2019, booster 06/2020  Pneumovax - 09/2007, 09/2008. Prevnar-13 2015  Td - 06/2016, Tdap 05/2018  zostavax - 09/2007  Shingrix - discussed, to check at pharmacy Advanced directives: Has advanced directives at home. Will bring copy for chart. Daughter [name] is HCPOA.  Seat belt use discussed  Sunscreen use discussed. No changing moles on skin. Sees derm.  Non smoker  Alcohol - none  Dentist - q6 mo  Eye exam - q6 mo, monitoring glaucoma  Trouble with IADLs 2/2 stroke.  Bowel - no constipation Bladder - no incontinence  Caffeine: 1 cup coffee/day   Occupation: ophthalmologist, retired Edu: James Bell  Activity: going to gym 3x/wk with trainer  Diet: some water, fruits/vegetables some, red meat a few times, fish 1x/wk POA - [name] (Daughter)      Relevant past medical, surgical, family and social history reviewed and updated as indicated. Interim medical history since our last visit reviewed.  Allergies and medications reviewed and updated. Outpatient Medications Prior to Visit  Medication Sig Dispense Refill   Alpha-Lipoic Acid 600 MG CAPS Take 1 capsule (600 mg total) by mouth daily. 30 capsule 11   BD PEN NEEDLE NANO 2ND GEN 32G X 4 MM MISC USE TO INJECT MEDICATION DAILY 100 each 3   brimonidine (ALPHAGAN) 0.2 % ophthalmic solution      clopidogrel (PLAVIX) 75 MG tablet TAKE 1 TABLET DAILY 90 tablet 3   Coenzyme Q10 (COQ10) 150 MG CAPS Take 1 capsule by mouth daily.     COMBIGAN 0.2-0.5 % ophthalmic solution      docusate sodium (COLACE) 100 MG capsule Take 1 capsule (100 mg total) by mouth 2 (two) times daily as needed for mild constipation. 10 capsule 0   dorzolamide-timolol (COSOPT) 22.3-6.8 MG/ML ophthalmic solution      DULoxetine (CYMBALTA) 60 MG capsule TAKE 1 CAPSULE DAILY 90 capsule 3   famotidine (PEPCID) 20 MG tablet Take 1  tablet (20 mg total) by mouth at bedtime.     Fluocinolone Acetonide 0.01 % OIL      gabapentin (NEURONTIN) 300 MG capsule Take 600 mg by mouth 3 (three) times daily. 600 mg BID is his minimum dose without having breakthrough symptoms. He is aware 900 mg/day is recommended per kidney function.     insulin aspart (NOVOLOG) 100 UNIT/ML injection CBG 70 - 120: 0 units  CBG 121 - 150: 1 unit  CBG 151 - 200: 2 units  CBG 201 - 250: 3 units  CBG 251 - 300: 5 units  CBG 301 - 350: 7 units  CBG 351 - 400: 9 units 10 mL 11   insulin glargine (LANTUS) 100 UNIT/ML injection Inject 0.4 mLs (40 Units total) into the skin at bedtime. 10 mL 11   ipratropium (ATROVENT) 0.03 % nasal spray      l-methylfolate-B6-B12 (METANX) 3-35-2 MG TABS tablet Take 1 tablet by mouth daily. 30 tablet 0   levothyroxine (SYNTHROID) 50 MCG tablet Take 1 tablet (50 mcg total) by mouth daily before breakfast. Per endo [name]     LUMIGAN 0.01 % SOLN Place 1 drop into the right eye at bedtime.     melatonin 5 MG TABS Take 5 mg by mouth at bedtime.     nitroGLYCERIN (NITROSTAT) 0.4 MG SL tablet DISSOLVE 1 TABLET UNDER THE TONGUE EVERY 5 MINUTES AS NEEDED FOR CHEST PAIN 25 tablet 1   ofloxacin (FLOXIN) 0.3 % OTIC solution      Omega-3 Fatty Acids (FISH OIL) 1000 MG CAPS Take 1 capsule (1,000 mg total) by mouth in the morning and at bedtime.  0   ondansetron (ZOFRAN) 4 MG tablet Take 1 tablet (4 mg total) by mouth every 8 (eight) hours as needed for nausea or vomiting. 30 tablet 0   polyethylene glycol (MIRALAX / GLYCOLAX) 17 g packet Take 17 g by mouth daily as needed. 14 each 0   pramipexole (MIRAPEX) 1 MG tablet Take 1 tablet (1 mg total) by mouth See admin instructions. At 4pm 90 tablet 1   RABEprazole (ACIPHEX) 20 MG tablet TAKE 1 TABLET DAILY 90 tablet 3   rosuvastatin (CRESTOR) 40 MG tablet TAKE 1 TABLET BY MOUTH ONCE DAILY 90 tablet 3   senna-docusate (SENOKOT-S) 8.6-50 MG tablet Take 1 tablet by mouth at bedtime. 30 tablet 0    sodium chloride (MURO 128) 5 % ophthalmic solution 1 drop as needed for eye irritation.     tamsulosin (FLOMAX)  0.4 MG CAPS capsule Take 1 capsule (0.4 mg total) by mouth daily after breakfast. 90 capsule 1   vitamin C (ASCORBIC ACID) 500 MG tablet Take 1,000 mg by mouth daily as needed.     No facility-administered medications prior to visit.     Per HPI unless specifically indicated in ROS section below Review of Systems  Constitutional:  Negative for activity change, appetite change, chills, fatigue, fever and unexpected weight change.  HENT:  Negative for hearing loss.   Eyes:  Negative for visual disturbance.  Respiratory:  Negative for cough, chest tightness, shortness of breath and wheezing.   Cardiovascular:  Negative for chest pain, palpitations and leg swelling.  Gastrointestinal:  Positive for diarrhea (mild). Negative for abdominal distention, abdominal pain, blood in stool, constipation, nausea and vomiting.  Genitourinary:  Negative for difficulty urinating and hematuria.  Musculoskeletal:  Negative for arthralgias, myalgias and neck pain.  Skin:  Negative for rash.  Neurological:  Negative for dizziness, seizures, syncope and headaches.  Hematological:  Negative for adenopathy. Does not bruise/bleed easily.  Psychiatric/Behavioral:  Negative for dysphoric mood. The patient is not nervous/anxious.    Objective:  BP 128/72   Pulse 72   Temp (!) 97.5 F (36.4 C) (Temporal)   Ht 5' 7.25" (1.708 m)   Wt 184 lb 9 oz (83.7 kg)   SpO2 97%   BMI 28.69 kg/m   Wt Readings from Last 3 Encounters:  06/13/21 184 lb 9 oz (83.7 kg)  05/11/21 194 lb (88 kg)  04/13/21 180 lb (81.6 kg)      Physical Exam Vitals and nursing note reviewed.  Constitutional:      General: He is not in acute distress.    Appearance: Normal appearance. He is well-developed. He is not ill-appearing.  HENT:     Head: Normocephalic and atraumatic.     Right Ear: Hearing, tympanic membrane, ear  canal and external ear normal.     Left Ear: Hearing, tympanic membrane, ear canal and external ear normal.  Eyes:     General: No scleral icterus.    Extraocular Movements: Extraocular movements intact.     Conjunctiva/sclera: Conjunctivae normal.     Pupils: Pupils are equal, round, and reactive to light.  Neck:     Thyroid: No thyroid mass or thyromegaly.     Vascular: No carotid bruit.  Cardiovascular:     Rate and Rhythm: Normal rate and regular rhythm.     Pulses: Normal pulses.          Radial pulses are 2+ on the right side and 2+ on the left side.     Heart sounds: Normal heart sounds.  Pulmonary:     Effort: Pulmonary effort is normal. No respiratory distress.     Breath sounds: Normal breath sounds. No wheezing, rhonchi or rales.  Abdominal:     General: Bowel sounds are normal. There is no distension.     Palpations: Abdomen is soft. There is no mass.     Tenderness: There is abdominal tenderness (mild) in the epigastric area. There is no guarding or rebound.     Hernia: No hernia is present.  Musculoskeletal:        General: Normal range of motion.     Cervical back: Normal range of motion and neck supple.     Right lower leg: No edema.     Left lower leg: No edema.  Lymphadenopathy:     Cervical: No cervical adenopathy.  Skin:  General: Skin is warm and dry.     Findings: No rash.  Neurological:     General: No focal deficit present.     Mental Status: He is alert and oriented to person, place, and time.     Comments:  Recall 3/3 Calculation 2/5 DOLRW  Psychiatric:        Mood and Affect: Mood normal.        Behavior: Behavior normal.        Thought Content: Thought content normal.        Judgment: Judgment normal.      Results for orders placed or performed in visit on 06/13/21  VITAMIN D 25 Hydroxy (Vit-D Deficiency, Fractures)  Result Value Ref Range   VITD 53.89 30.00 - 100.00 ng/mL  IBC panel  Result Value Ref Range   Iron 115 42 - 165 ug/dL    Transferrin 231.0 212.0 - 360.0 mg/dL   Saturation Ratios 35.6 20.0 - 50.0 %   TIBC 323.4 250.0 - 450.0 mcg/dL  Fructosamine  Result Value Ref Range   Fructosamine 362 (H) 205 - 285 umol/L  Microalbumin / creatinine urine ratio  Result Value Ref Range   Microalb, Ur 4.8 (H) 0.0 - 1.9 mg/dL   Creatinine,U 21.8 mg/dL   Microalb Creat Ratio 22.2 0.0 - 30.0 mg/g  CBC with Differential/Platelet  Result Value Ref Range   WBC 8.8 4.0 - 10.5 K/uL   RBC 4.73 4.22 - 5.81 Mil/uL   Hemoglobin 14.3 13.0 - 17.0 g/dL   HCT 41.9 39.0 - 52.0 %   MCV 88.7 78.0 - 100.0 fl   MCHC 34.2 30.0 - 36.0 g/dL   RDW 13.7 11.5 - 15.5 %   Platelets 172.0 150.0 - 400.0 K/uL   Neutrophils Relative % 63.6 43.0 - 77.0 %   Lymphocytes Relative 25.8 12.0 - 46.0 %   Monocytes Relative 8.0 3.0 - 12.0 %   Eosinophils Relative 1.6 0.0 - 5.0 %   Basophils Relative 1.0 0.0 - 3.0 %   Neutro Abs 5.6 1.4 - 7.7 K/uL   Lymphs Abs 2.3 0.7 - 4.0 K/uL   Monocytes Absolute 0.7 0.1 - 1.0 K/uL   Eosinophils Absolute 0.1 0.0 - 0.7 K/uL   Basophils Absolute 0.1 0.0 - 0.1 K/uL  Lipid panel  Result Value Ref Range   Cholesterol 105 0 - 200 mg/dL   Triglycerides 209.0 (H) 0.0 - 149.0 mg/dL   HDL 18.50 (L) >39.00 mg/dL   VLDL 41.8 (H) 0.0 - 40.0 mg/dL   Total CHOL/HDL Ratio 6    NonHDL 86.54   Comprehensive metabolic panel  Result Value Ref Range   Sodium 135 135 - 145 mEq/L   Potassium 4.3 3.5 - 5.1 mEq/L   Chloride 99 96 - 112 mEq/L   CO2 28 19 - 32 mEq/L   Glucose, Bld 163 (H) 70 - 99 mg/dL   BUN 27 (H) 6 - 23 mg/dL   Creatinine, Ser 1.40 0.40 - 1.50 mg/dL   Total Bilirubin 0.7 0.2 - 1.2 mg/dL   Alkaline Phosphatase 41 39 - 117 U/L   AST 18 0 - 37 U/L   ALT 13 0 - 53 U/L   Total Protein 7.7 6.0 - 8.3 g/dL   Albumin 4.1 3.5 - 5.2 g/dL   GFR 45.05 (L) >60.00 mL/min   Calcium 9.3 8.4 - 10.5 mg/dL  LDL cholesterol, direct  Result Value Ref Range   Direct LDL 57.0 mg/dL   *Note: Due to a  large number of results and/or  encounters for the requested time period, some results have not been displayed. A complete set of results can be found in Results Review.    Assessment & Plan:  This visit occurred during the SARS-CoV-2 public health emergency.  Safety protocols were in place, including screening questions prior to the visit, additional usage of staff PPE, and extensive cleaning of exam room while observing appropriate contact time as indicated for disinfecting solutions.   Problem List Items Addressed This Visit     Medicare annual wellness visit, subsequent - Primary (Chronic)    I have personally reviewed the Medicare Annual Wellness questionnaire and have noted 1. The patient's medical and social history 2. Their use of alcohol, tobacco or illicit drugs 3. Their current medications and supplements 4. The patient's functional ability including ADL's, fall risks, home safety risks and hearing or visual impairment. Cognitive function has been assessed and addressed as indicated.  5. Diet and physical activity 6. Evidence for depression or mood disorders The patients weight, height, BMI have been recorded in the chart. I have made referrals, counseling and provided education to the patient based on review of the above and I have provided the pt with a written personalized care plan for preventive services. Provider list updated.. See scanned questionairre as needed for further documentation. Reviewed preventative protocols and updated unless pt declined.       Advanced care planning/counseling discussion (Chronic)    Advanced directives: Has advanced directives at home. Will bring copy for chart. Daughter [name] is HCPOA.       Healthcare maintenance (Chronic)    Preventative protocols reviewed and updated unless pt declined. Discussed healthy diet and lifestyle.       History of CVA (cerebrovascular accident)    Continue statin and plavix.       Hemiparesis affecting right side as late  effect of stroke [facility])    Will need new ASO brace sent to local DME supply store-  he will send [name] contact info to fax in Rx.       Type 2 diabetes mellitus, uncontrolled, with neuropathy ([place])    Followed by [name] - has started using Freestyle libre CGM, really likes it. Current DME - [facility], considering switch.       CKD stage 3 secondary to diabetes ([place])    Update Cr/GFR      Hyperlipidemia associated with type 2 diabetes mellitus (HCC)    Chronic, stable on crestor 40mg  daily. The ASCVD Risk score (Arnett DK, et al., 2019) failed to calculate for the following reasons:   The 2019 ASCVD risk score is only valid for ages 29 to 25       Restless leg syndrome, severe    Severe, has seen [facility] initially managed with oxycodone however did not tolerate this. Also received iron infusions 05/2020. Continues mirapex and gabapentin (higher dose than recommended based on GFR), unable to taper gabapentin lower due to worsening RLS.  Some improvement noted with dropping caffeine intake.       GERD (gastroesophageal reflux disease)    Continues aciphex + pepcid daily.       BPH with obstruction/lower urinary tract symptoms    Continues tamsulosin, started during hospitalization earlier this year.       MDD (major depressive disorder), recurrent episode, moderate (HCC)    Chronic, stable on cymbalta. Now on melatonin for sleep. Has seen psychiatry in the past - on levothyroxine for mood.  Spastic hemiparesis affecting dominant side (HCC)   HTN (hypertension)    Chronic, stable period off antihypertensive medication.      Thrombocytopenia ([place])    Update CBC      Polypharmacy   Other Visit Diagnoses     Need for influenza vaccination       Relevant Orders   Flu Vaccine QUAD High Dose(Fluad) (Completed)        No orders of the defined types were placed in this encounter.  Orders Placed This Encounter  Procedures   Flu Vaccine QUAD High Dose(Fluad)    LDL cholesterol, direct     Patient instuctions: Labs today  Flu shot today  Find name of durable medical supply to send ASO brace prescription into.  If interested, check with pharmacy about new 2 shot shingles series (shingrix).  Good to see you today  Return as needed or in 4-6 months for follow up visit.   Follow up plan: Return in about 4 months (around 10/13/2021) for follow up visit.  [name], James Bell

## 2021-06-13 NOTE — Assessment & Plan Note (Signed)

## 2021-06-13 NOTE — Patient Instructions (Addendum)
Flu shot today  Labs today  Find name of durable medical supply to send ASO brace prescription into.  If interested, check with pharmacy about new 2 shot shingles series (shingrix).  Good to see you today  Return as needed or in 4-6 months for follow up visit.   Health Maintenance After Age 85 After age 12, you are at a higher risk for certain long-term diseases and infections as well as injuries from falls. Falls are a major cause of broken bones and head injuries in people who are older than age 8. Getting regular preventive care can help to keep you healthy and well. Preventive care includes getting regular testing and making lifestyle changes as recommended by your health care provider. Talk with your health care provider about: Which screenings and tests you should have. A screening is a test that checks for a disease when you have no symptoms. A diet and exercise plan that is right for you. What should I know about screenings and tests to prevent falls? Screening and testing are the best ways to find a health problem early. Early diagnosis and treatment give you the best chance of managing medical conditions that are common after age 3. Certain conditions and lifestyle choices may make you more likely to have a fall. Your health care provider may recommend: Regular vision checks. Poor vision and conditions such as cataracts can make you more likely to have a fall. If you wear glasses, make sure to get your prescription updated if your vision changes. Medicine review. Work with your health care provider to regularly review all of the medicines you are taking, including over-the-counter medicines. Ask your health care provider about any side effects that may make you more likely to have a fall. Tell your health care provider if any medicines that you take make you feel dizzy or sleepy. Osteoporosis screening. Osteoporosis is a condition that causes the bones to get weaker. This can make the  bones weak and cause them to break more easily. Blood pressure screening. Blood pressure changes and medicines to control blood pressure can make you feel dizzy. Strength and balance checks. Your health care provider may recommend certain tests to check your strength and balance while standing, walking, or changing positions. Foot health exam. Foot pain and numbness, as well as not wearing proper footwear, can make you more likely to have a fall. Depression screening. You may be more likely to have a fall if you have a fear of falling, feel emotionally low, or feel unable to do activities that you used to do. Alcohol use screening. Using too much alcohol can affect your balance and may make you more likely to have a fall. What actions can I take to lower my risk of falls? General instructions Talk with your health care provider about your risks for falling. Tell your health care provider if: You fall. Be sure to tell your health care provider about all falls, even ones that seem minor. You feel dizzy, sleepy, or off-balance. Take over-the-counter and prescription medicines only as told by your health care provider. These include any supplements. Eat a healthy diet and maintain a healthy weight. A healthy diet includes low-fat dairy products, low-fat (lean) meats, and fiber from whole grains, beans, and lots of fruits and vegetables. Home safety Remove any tripping hazards, such as rugs, cords, and clutter. Install safety equipment such as grab bars in bathrooms and safety rails on stairs. Keep rooms and walkways well-lit. Activity  Follow a  regular exercise program to stay fit. This will help you maintain your balance. Ask your health care provider what types of exercise are appropriate for you. If you need a cane or walker, use it as recommended by your health care provider. Wear supportive shoes that have nonskid soles. Lifestyle Do not drink alcohol if your health care provider tells you  not to drink. If you drink alcohol, limit how much you have: 0-1 drink a day for women. 0-2 drinks a day for men. Be aware of how much alcohol is in your drink. In the [place] one drink equals one typical bottle of beer (12 oz), one-half glass of wine (5 oz), or one shot of hard liquor (1 oz). Do not use any products that contain nicotine or tobacco, such as cigarettes and e-cigarettes. If you need help quitting, ask your health care provider. Summary Having a healthy lifestyle and getting preventive care can help to protect your health and wellness after age 43. Screening and testing are the best way to find a health problem early and help you avoid having a fall. Early diagnosis and treatment give you the best chance for managing medical conditions that are more common for people who are older than age 43. Falls are a major cause of broken bones and head injuries in people who are older than age 57. Take precautions to prevent a fall at home. Work with your health care provider to learn what changes you can make to improve your health and wellness and to prevent falls. This information is not intended to replace advice given to you by your health care provider. Make sure you discuss any questions you have with your health care provider. Document Revised: 11/12/2020 Document Reviewed: 08/20/2020 Elsevier Patient Education  2022 [employer].

## 2021-06-13 NOTE — Assessment & Plan Note (Signed)
Preventative protocols reviewed and updated unless pt declined. Discussed healthy diet and lifestyle.  

## 2021-06-13 NOTE — Assessment & Plan Note (Signed)
Advanced directives: Has advanced directives at home. Will bring copy for chart. Daughter [name] is HCPOA.

## 2021-06-13 NOTE — Assessment & Plan Note (Addendum)
Continue statin and plavix.

## 2021-06-14 DIAGNOSIS — M6281 Muscle weakness (generalized): Secondary | ICD-10-CM | POA: Diagnosis not present

## 2021-06-14 DIAGNOSIS — M25561 Pain in right knee: Secondary | ICD-10-CM | POA: Diagnosis not present

## 2021-06-14 DIAGNOSIS — R279 Unspecified lack of coordination: Secondary | ICD-10-CM | POA: Diagnosis not present

## 2021-06-14 DIAGNOSIS — R2681 Unsteadiness on feet: Secondary | ICD-10-CM | POA: Diagnosis not present

## 2021-06-14 LAB — IBC PANEL
Iron: 115 ug/dL (ref 42–165)
Saturation Ratios: 35.6 % (ref 20.0–50.0)
TIBC: 323.4 ug/dL (ref 250.0–450.0)
Transferrin: 231 mg/dL (ref 212.0–360.0)

## 2021-06-14 LAB — LIPID PANEL
Cholesterol: 105 mg/dL (ref 0–200)
HDL: 18.5 mg/dL — ABNORMAL LOW (ref >39.00)
NonHDL: 86.54
Total CHOL/HDL Ratio: 6
Triglycerides: 209 mg/dL — ABNORMAL HIGH (ref 0.0–149.0)
VLDL: 41.8 mg/dL — ABNORMAL HIGH (ref 0.0–40.0)

## 2021-06-14 LAB — COMPREHENSIVE METABOLIC PANEL
ALT: 13 U/L (ref 0–53)
AST: 18 U/L (ref 0–37)
Albumin: 4.1 g/dL (ref 3.5–5.2)
Alkaline Phosphatase: 41 U/L (ref 39–117)
BUN: 27 mg/dL — ABNORMAL HIGH (ref 6–23)
CO2: 28 mEq/L (ref 19–32)
Calcium: 9.3 mg/dL (ref 8.4–10.5)
Chloride: 99 mEq/L (ref 96–112)
Creatinine, Ser: 1.4 mg/dL (ref 0.40–1.50)
GFR: 45.05 mL/min — ABNORMAL LOW (ref >60.00)
Glucose, Bld: 163 mg/dL — ABNORMAL HIGH (ref 70–99)
Potassium: 4.3 mEq/L (ref 3.5–5.1)
Sodium: 135 mEq/L (ref 135–145)
Total Bilirubin: 0.7 mg/dL (ref 0.2–1.2)
Total Protein: 7.7 g/dL (ref 6.0–8.3)

## 2021-06-14 LAB — CBC WITH DIFFERENTIAL/PLATELET
Basophils Absolute: 0.1 10*3/uL (ref 0.0–0.1)
Basophils Relative: 1 % (ref 0.0–3.0)
Eosinophils Absolute: 0.1 10*3/uL (ref 0.0–0.7)
Eosinophils Relative: 1.6 % (ref 0.0–5.0)
HCT: 41.9 % (ref 39.0–52.0)
Hemoglobin: 14.3 g/dL (ref 13.0–17.0)
Lymphocytes Relative: 25.8 % (ref 12.0–46.0)
Lymphs Abs: 2.3 10*3/uL (ref 0.7–4.0)
MCHC: 34.2 g/dL (ref 30.0–36.0)
MCV: 88.7 fl (ref 78.0–100.0)
Monocytes Absolute: 0.7 10*3/uL (ref 0.1–1.0)
Monocytes Relative: 8 % (ref 3.0–12.0)
Neutro Abs: 5.6 10*3/uL (ref 1.4–7.7)
Neutrophils Relative %: 63.6 % (ref 43.0–77.0)
Platelets: 172 10*3/uL (ref 150.0–400.0)
RBC: 4.73 Mil/uL (ref 4.22–5.81)
RDW: 13.7 % (ref 11.5–15.5)
WBC: 8.8 10*3/uL (ref 4.0–10.5)

## 2021-06-14 LAB — LDL CHOLESTEROL, DIRECT: Direct LDL: 57 mg/dL

## 2021-06-14 LAB — MICROALBUMIN / CREATININE URINE RATIO
Creatinine,U: 21.8 mg/dL
Microalb Creat Ratio: 22.2 mg/g (ref 0.0–30.0)
Microalb, Ur: 4.8 mg/dL — ABNORMAL HIGH (ref 0.0–1.9)

## 2021-06-14 LAB — VITAMIN D 25 HYDROXY (VIT D DEFICIENCY, FRACTURES): VITD: 53.89 ng/mL (ref 30.00–100.00)

## 2021-06-15 DIAGNOSIS — R279 Unspecified lack of coordination: Secondary | ICD-10-CM | POA: Diagnosis not present

## 2021-06-15 DIAGNOSIS — M6281 Muscle weakness (generalized): Secondary | ICD-10-CM | POA: Diagnosis not present

## 2021-06-15 DIAGNOSIS — I69398 Other sequelae of cerebral infarction: Secondary | ICD-10-CM | POA: Diagnosis not present

## 2021-06-15 DIAGNOSIS — R296 Repeated falls: Secondary | ICD-10-CM | POA: Diagnosis not present

## 2021-06-16 DIAGNOSIS — M25561 Pain in right knee: Secondary | ICD-10-CM | POA: Diagnosis not present

## 2021-06-16 DIAGNOSIS — R279 Unspecified lack of coordination: Secondary | ICD-10-CM | POA: Diagnosis not present

## 2021-06-16 DIAGNOSIS — R296 Repeated falls: Secondary | ICD-10-CM | POA: Diagnosis not present

## 2021-06-16 DIAGNOSIS — R2681 Unsteadiness on feet: Secondary | ICD-10-CM | POA: Diagnosis not present

## 2021-06-16 DIAGNOSIS — M6281 Muscle weakness (generalized): Secondary | ICD-10-CM | POA: Diagnosis not present

## 2021-06-16 LAB — FRUCTOSAMINE: Fructosamine: 362 umol/L — ABNORMAL HIGH (ref 205–285)

## 2021-06-16 NOTE — Assessment & Plan Note (Signed)
Followed by [name] - has started using Freestyle libre CGM, really likes it. Current DME - [facility], considering switch.

## 2021-06-16 NOTE — Assessment & Plan Note (Addendum)
Chronic, stable on cymbalta. Now on melatonin for sleep. Has seen psychiatry in the past - on levothyroxine for mood.

## 2021-06-16 NOTE — Assessment & Plan Note (Addendum)
Update CBC. 

## 2021-06-16 NOTE — Assessment & Plan Note (Addendum)
Severe, has seen [facility] initially managed with oxycodone however did not tolerate this. Also received iron infusions 05/2020. Continues mirapex and gabapentin (higher dose than recommended based on GFR), unable to taper gabapentin lower due to worsening RLS.  Some improvement noted with dropping caffeine intake.

## 2021-06-16 NOTE — Assessment & Plan Note (Signed)
Will need new ASO brace sent to local DME supply store-  he will send [name] contact info to fax in Rx.

## 2021-06-16 NOTE — Assessment & Plan Note (Signed)
Continues tamsulosin, started during hospitalization earlier this year.

## 2021-06-16 NOTE — Assessment & Plan Note (Signed)
Chronic, stable period off antihypertensive medication.

## 2021-06-16 NOTE — Assessment & Plan Note (Addendum)
Continues aciphex + pepcid daily.  

## 2021-06-16 NOTE — Assessment & Plan Note (Signed)
Chronic, stable on crestor 40mg  daily. The ASCVD Risk score (Arnett DK, et al., 2019) failed to calculate for the following reasons:   The 2019 ASCVD risk score is only valid for ages 43 to 88

## 2021-06-16 NOTE — Assessment & Plan Note (Signed)
Update Cr/GFR

## 2021-06-17 DIAGNOSIS — R296 Repeated falls: Secondary | ICD-10-CM | POA: Diagnosis not present

## 2021-06-17 DIAGNOSIS — M6281 Muscle weakness (generalized): Secondary | ICD-10-CM | POA: Diagnosis not present

## 2021-06-17 DIAGNOSIS — R279 Unspecified lack of coordination: Secondary | ICD-10-CM | POA: Diagnosis not present

## 2021-06-17 DIAGNOSIS — I69398 Other sequelae of cerebral infarction: Secondary | ICD-10-CM | POA: Diagnosis not present

## 2021-06-20 ENCOUNTER — Telehealth: Payer: Self-pay | Admitting: Family Medicine

## 2021-06-20 DIAGNOSIS — R2681 Unsteadiness on feet: Secondary | ICD-10-CM | POA: Diagnosis not present

## 2021-06-20 DIAGNOSIS — M25561 Pain in right knee: Secondary | ICD-10-CM | POA: Diagnosis not present

## 2021-06-20 DIAGNOSIS — M6281 Muscle weakness (generalized): Secondary | ICD-10-CM | POA: Diagnosis not present

## 2021-06-20 DIAGNOSIS — R296 Repeated falls: Secondary | ICD-10-CM | POA: Diagnosis not present

## 2021-06-20 DIAGNOSIS — R279 Unspecified lack of coordination: Secondary | ICD-10-CM | POA: Diagnosis not present

## 2021-06-20 NOTE — Telephone Encounter (Signed)
Pt called to give you the information for the [name] [address]. Fax # [phone] Phone [phone]

## 2021-06-20 NOTE — Telephone Encounter (Signed)
Faxed rx to [facility].

## 2021-06-21 DIAGNOSIS — R279 Unspecified lack of coordination: Secondary | ICD-10-CM | POA: Diagnosis not present

## 2021-06-21 DIAGNOSIS — R296 Repeated falls: Secondary | ICD-10-CM | POA: Diagnosis not present

## 2021-06-21 DIAGNOSIS — M25561 Pain in right knee: Secondary | ICD-10-CM | POA: Diagnosis not present

## 2021-06-21 DIAGNOSIS — I69398 Other sequelae of cerebral infarction: Secondary | ICD-10-CM | POA: Diagnosis not present

## 2021-06-21 DIAGNOSIS — M6281 Muscle weakness (generalized): Secondary | ICD-10-CM | POA: Diagnosis not present

## 2021-06-23 DIAGNOSIS — R279 Unspecified lack of coordination: Secondary | ICD-10-CM | POA: Diagnosis not present

## 2021-06-23 DIAGNOSIS — I69398 Other sequelae of cerebral infarction: Secondary | ICD-10-CM | POA: Diagnosis not present

## 2021-06-23 DIAGNOSIS — R2681 Unsteadiness on feet: Secondary | ICD-10-CM | POA: Diagnosis not present

## 2021-06-23 DIAGNOSIS — M6281 Muscle weakness (generalized): Secondary | ICD-10-CM | POA: Diagnosis not present

## 2021-06-23 DIAGNOSIS — M25561 Pain in right knee: Secondary | ICD-10-CM | POA: Diagnosis not present

## 2021-06-23 DIAGNOSIS — R296 Repeated falls: Secondary | ICD-10-CM | POA: Diagnosis not present

## 2021-06-24 DIAGNOSIS — I69398 Other sequelae of cerebral infarction: Secondary | ICD-10-CM | POA: Diagnosis not present

## 2021-06-24 DIAGNOSIS — M25561 Pain in right knee: Secondary | ICD-10-CM | POA: Diagnosis not present

## 2021-06-24 DIAGNOSIS — R279 Unspecified lack of coordination: Secondary | ICD-10-CM | POA: Diagnosis not present

## 2021-06-24 DIAGNOSIS — M6281 Muscle weakness (generalized): Secondary | ICD-10-CM | POA: Diagnosis not present

## 2021-06-24 DIAGNOSIS — R296 Repeated falls: Secondary | ICD-10-CM | POA: Diagnosis not present

## 2021-06-24 DIAGNOSIS — R2681 Unsteadiness on feet: Secondary | ICD-10-CM | POA: Diagnosis not present

## 2021-06-27 DIAGNOSIS — E1165 Type 2 diabetes mellitus with hyperglycemia: Secondary | ICD-10-CM | POA: Diagnosis not present

## 2021-06-27 DIAGNOSIS — M25561 Pain in right knee: Secondary | ICD-10-CM | POA: Diagnosis not present

## 2021-06-28 ENCOUNTER — Telehealth: Payer: Self-pay | Admitting: Family Medicine

## 2021-06-28 DIAGNOSIS — M6281 Muscle weakness (generalized): Secondary | ICD-10-CM | POA: Diagnosis not present

## 2021-06-28 DIAGNOSIS — R279 Unspecified lack of coordination: Secondary | ICD-10-CM | POA: Diagnosis not present

## 2021-06-28 DIAGNOSIS — M25561 Pain in right knee: Secondary | ICD-10-CM | POA: Diagnosis not present

## 2021-06-28 DIAGNOSIS — I69398 Other sequelae of cerebral infarction: Secondary | ICD-10-CM | POA: Diagnosis not present

## 2021-06-28 DIAGNOSIS — R2681 Unsteadiness on feet: Secondary | ICD-10-CM | POA: Diagnosis not present

## 2021-06-28 DIAGNOSIS — R296 Repeated falls: Secondary | ICD-10-CM | POA: Diagnosis not present

## 2021-06-28 MED ORDER — TAMSULOSIN HCL 0.4 MG PO CAPS: 0.4000 mg | ORAL_CAPSULE | Freq: Every day | ORAL | 3 refills | Status: DC

## 2021-06-28 MED ORDER — DULOXETINE HCL 60 MG PO CPEP: 60.0000 mg | ORAL_CAPSULE | Freq: Every day | ORAL | 3 refills | Status: DC

## 2021-06-28 MED ORDER — ALPHA-LIPOIC ACID 600 MG PO CAPS: 1.0000 | ORAL_CAPSULE | Freq: Every day | ORAL | 11 refills | Status: DC

## 2021-06-28 NOTE — Telephone Encounter (Signed)
[facility] called in and wanted to know about getting prescriptions due to he is going to the [facility]. And wanted to know about getting a updated prescriptions due to they were transferred from [place]  The medications are: DULoxetine (CYMBALTA) 60 MG capsule tamsulosin (FLOMAX) 0.4 MG CAPS capsule

## 2021-06-28 NOTE — Addendum Note (Signed)
Addended by: Brenton Grills on: 82/70/7867 12:49 PM   Modules accepted: Orders

## 2021-06-28 NOTE — Telephone Encounter (Signed)
E-scribed refills.  

## 2021-06-28 NOTE — Addendum Note (Signed)
Addended by: Brenton Grills on: 03/49/6116 01:05 PM   Modules accepted: Orders

## 2021-06-30 DIAGNOSIS — I69398 Other sequelae of cerebral infarction: Secondary | ICD-10-CM | POA: Diagnosis not present

## 2021-06-30 DIAGNOSIS — R296 Repeated falls: Secondary | ICD-10-CM | POA: Diagnosis not present

## 2021-06-30 DIAGNOSIS — M6281 Muscle weakness (generalized): Secondary | ICD-10-CM | POA: Diagnosis not present

## 2021-06-30 DIAGNOSIS — R279 Unspecified lack of coordination: Secondary | ICD-10-CM | POA: Diagnosis not present

## 2021-07-01 DIAGNOSIS — R2681 Unsteadiness on feet: Secondary | ICD-10-CM | POA: Diagnosis not present

## 2021-07-01 DIAGNOSIS — R279 Unspecified lack of coordination: Secondary | ICD-10-CM | POA: Diagnosis not present

## 2021-07-01 DIAGNOSIS — M25561 Pain in right knee: Secondary | ICD-10-CM | POA: Diagnosis not present

## 2021-07-01 DIAGNOSIS — R296 Repeated falls: Secondary | ICD-10-CM | POA: Diagnosis not present

## 2021-07-01 DIAGNOSIS — M6281 Muscle weakness (generalized): Secondary | ICD-10-CM | POA: Diagnosis not present

## 2021-07-04 DIAGNOSIS — R2681 Unsteadiness on feet: Secondary | ICD-10-CM | POA: Diagnosis not present

## 2021-07-04 DIAGNOSIS — M25561 Pain in right knee: Secondary | ICD-10-CM | POA: Diagnosis not present

## 2021-07-04 DIAGNOSIS — R279 Unspecified lack of coordination: Secondary | ICD-10-CM | POA: Diagnosis not present

## 2021-07-04 DIAGNOSIS — M6281 Muscle weakness (generalized): Secondary | ICD-10-CM | POA: Diagnosis not present

## 2021-07-05 DIAGNOSIS — M6281 Muscle weakness (generalized): Secondary | ICD-10-CM | POA: Diagnosis not present

## 2021-07-05 DIAGNOSIS — R296 Repeated falls: Secondary | ICD-10-CM | POA: Diagnosis not present

## 2021-07-05 DIAGNOSIS — I69398 Other sequelae of cerebral infarction: Secondary | ICD-10-CM | POA: Diagnosis not present

## 2021-07-05 DIAGNOSIS — R279 Unspecified lack of coordination: Secondary | ICD-10-CM | POA: Diagnosis not present

## 2021-07-06 DIAGNOSIS — R2681 Unsteadiness on feet: Secondary | ICD-10-CM | POA: Diagnosis not present

## 2021-07-06 DIAGNOSIS — M25561 Pain in right knee: Secondary | ICD-10-CM | POA: Diagnosis not present

## 2021-07-06 DIAGNOSIS — M6281 Muscle weakness (generalized): Secondary | ICD-10-CM | POA: Diagnosis not present

## 2021-07-06 DIAGNOSIS — R296 Repeated falls: Secondary | ICD-10-CM | POA: Diagnosis not present

## 2021-07-06 DIAGNOSIS — R279 Unspecified lack of coordination: Secondary | ICD-10-CM | POA: Diagnosis not present

## 2021-07-08 DIAGNOSIS — I69351 Hemiplegia and hemiparesis following cerebral infarction affecting right dominant side: Secondary | ICD-10-CM | POA: Diagnosis not present

## 2021-07-08 DIAGNOSIS — R269 Unspecified abnormalities of gait and mobility: Secondary | ICD-10-CM | POA: Diagnosis not present

## 2021-07-08 DIAGNOSIS — E1140 Type 2 diabetes mellitus with diabetic neuropathy, unspecified: Secondary | ICD-10-CM | POA: Diagnosis not present

## 2021-07-08 DIAGNOSIS — M25561 Pain in right knee: Secondary | ICD-10-CM | POA: Diagnosis not present

## 2021-07-08 DIAGNOSIS — I69398 Other sequelae of cerebral infarction: Secondary | ICD-10-CM | POA: Diagnosis not present

## 2021-07-08 DIAGNOSIS — G8110 Spastic hemiplegia affecting unspecified side: Secondary | ICD-10-CM | POA: Diagnosis not present

## 2021-07-08 DIAGNOSIS — R279 Unspecified lack of coordination: Secondary | ICD-10-CM | POA: Diagnosis not present

## 2021-07-08 DIAGNOSIS — R296 Repeated falls: Secondary | ICD-10-CM | POA: Diagnosis not present

## 2021-07-08 DIAGNOSIS — G2581 Restless legs syndrome: Secondary | ICD-10-CM | POA: Diagnosis not present

## 2021-07-08 DIAGNOSIS — R2681 Unsteadiness on feet: Secondary | ICD-10-CM | POA: Diagnosis not present

## 2021-07-08 DIAGNOSIS — M6281 Muscle weakness (generalized): Secondary | ICD-10-CM | POA: Diagnosis not present

## 2021-07-12 DIAGNOSIS — R296 Repeated falls: Secondary | ICD-10-CM | POA: Diagnosis not present

## 2021-07-12 DIAGNOSIS — R2681 Unsteadiness on feet: Secondary | ICD-10-CM | POA: Diagnosis not present

## 2021-07-12 DIAGNOSIS — I69398 Other sequelae of cerebral infarction: Secondary | ICD-10-CM | POA: Diagnosis not present

## 2021-07-12 DIAGNOSIS — M6281 Muscle weakness (generalized): Secondary | ICD-10-CM | POA: Diagnosis not present

## 2021-07-12 DIAGNOSIS — M25561 Pain in right knee: Secondary | ICD-10-CM | POA: Diagnosis not present

## 2021-07-12 DIAGNOSIS — R279 Unspecified lack of coordination: Secondary | ICD-10-CM | POA: Diagnosis not present

## 2021-07-13 ENCOUNTER — Telehealth: Payer: Self-pay | Admitting: Family Medicine

## 2021-07-13 DIAGNOSIS — R296 Repeated falls: Secondary | ICD-10-CM | POA: Diagnosis not present

## 2021-07-13 DIAGNOSIS — M25561 Pain in right knee: Secondary | ICD-10-CM | POA: Diagnosis not present

## 2021-07-13 DIAGNOSIS — R2681 Unsteadiness on feet: Secondary | ICD-10-CM | POA: Diagnosis not present

## 2021-07-13 DIAGNOSIS — M6281 Muscle weakness (generalized): Secondary | ICD-10-CM | POA: Diagnosis not present

## 2021-07-13 DIAGNOSIS — R279 Unspecified lack of coordination: Secondary | ICD-10-CM | POA: Diagnosis not present

## 2021-07-13 NOTE — Telephone Encounter (Signed)
New Rx written and in [name]'s box.  °

## 2021-07-13 NOTE — Telephone Encounter (Signed)
Spoke to patient by telephone and was advised that Dr. Darnell Level gave him a script for a leg prosthetic and he is not happy with the company that he used. Patient stated that he wants another script written for a leg prosthetic and it has to show ASO on the script. Patient stated the company that he wants to use now is [facility] that is in [place]. Patient stated that their telephone number is [phone] to call and get the fax number if Dr. Darnell Level will do this for him.

## 2021-07-13 NOTE — Telephone Encounter (Signed)
Pt called in stated need a proscription for a prosthetics . Would like a call back [phone]

## 2021-07-14 DIAGNOSIS — M6281 Muscle weakness (generalized): Secondary | ICD-10-CM | POA: Diagnosis not present

## 2021-07-14 DIAGNOSIS — H6123 Impacted cerumen, bilateral: Secondary | ICD-10-CM | POA: Diagnosis not present

## 2021-07-14 DIAGNOSIS — H903 Sensorineural hearing loss, bilateral: Secondary | ICD-10-CM | POA: Diagnosis not present

## 2021-07-14 DIAGNOSIS — R279 Unspecified lack of coordination: Secondary | ICD-10-CM | POA: Diagnosis not present

## 2021-07-14 DIAGNOSIS — R296 Repeated falls: Secondary | ICD-10-CM | POA: Diagnosis not present

## 2021-07-14 DIAGNOSIS — I69398 Other sequelae of cerebral infarction: Secondary | ICD-10-CM | POA: Diagnosis not present

## 2021-07-15 DIAGNOSIS — R296 Repeated falls: Secondary | ICD-10-CM | POA: Diagnosis not present

## 2021-07-15 DIAGNOSIS — M6281 Muscle weakness (generalized): Secondary | ICD-10-CM | POA: Diagnosis not present

## 2021-07-15 DIAGNOSIS — R279 Unspecified lack of coordination: Secondary | ICD-10-CM | POA: Diagnosis not present

## 2021-07-15 DIAGNOSIS — R2681 Unsteadiness on feet: Secondary | ICD-10-CM | POA: Diagnosis not present

## 2021-07-15 DIAGNOSIS — M25561 Pain in right knee: Secondary | ICD-10-CM | POA: Diagnosis not present

## 2021-07-15 NOTE — Telephone Encounter (Signed)
Faxed rx to [facility] at [phone].

## 2021-07-18 DIAGNOSIS — R279 Unspecified lack of coordination: Secondary | ICD-10-CM | POA: Diagnosis not present

## 2021-07-18 DIAGNOSIS — H401132 Primary open-angle glaucoma, bilateral, moderate stage: Secondary | ICD-10-CM | POA: Diagnosis not present

## 2021-07-18 DIAGNOSIS — M6281 Muscle weakness (generalized): Secondary | ICD-10-CM | POA: Diagnosis not present

## 2021-07-18 DIAGNOSIS — R2681 Unsteadiness on feet: Secondary | ICD-10-CM | POA: Diagnosis not present

## 2021-07-18 DIAGNOSIS — M25561 Pain in right knee: Secondary | ICD-10-CM | POA: Diagnosis not present

## 2021-07-20 DIAGNOSIS — R296 Repeated falls: Secondary | ICD-10-CM | POA: Diagnosis not present

## 2021-07-20 DIAGNOSIS — I69398 Other sequelae of cerebral infarction: Secondary | ICD-10-CM | POA: Diagnosis not present

## 2021-07-20 DIAGNOSIS — R2681 Unsteadiness on feet: Secondary | ICD-10-CM | POA: Diagnosis not present

## 2021-07-20 DIAGNOSIS — M25561 Pain in right knee: Secondary | ICD-10-CM | POA: Diagnosis not present

## 2021-07-20 DIAGNOSIS — R279 Unspecified lack of coordination: Secondary | ICD-10-CM | POA: Diagnosis not present

## 2021-07-20 DIAGNOSIS — M6281 Muscle weakness (generalized): Secondary | ICD-10-CM | POA: Diagnosis not present

## 2021-07-21 DIAGNOSIS — M25561 Pain in right knee: Secondary | ICD-10-CM | POA: Diagnosis not present

## 2021-07-21 DIAGNOSIS — I69398 Other sequelae of cerebral infarction: Secondary | ICD-10-CM | POA: Diagnosis not present

## 2021-07-21 DIAGNOSIS — M6281 Muscle weakness (generalized): Secondary | ICD-10-CM | POA: Diagnosis not present

## 2021-07-21 DIAGNOSIS — R2681 Unsteadiness on feet: Secondary | ICD-10-CM | POA: Diagnosis not present

## 2021-07-21 DIAGNOSIS — R296 Repeated falls: Secondary | ICD-10-CM | POA: Diagnosis not present

## 2021-07-21 DIAGNOSIS — R279 Unspecified lack of coordination: Secondary | ICD-10-CM | POA: Diagnosis not present

## 2021-07-22 ENCOUNTER — Encounter: Payer: Self-pay | Admitting: Physical Medicine & Rehabilitation

## 2021-07-22 ENCOUNTER — Other Ambulatory Visit: Payer: Self-pay

## 2021-07-22 ENCOUNTER — Encounter: Payer: Medicare Other | Attending: Physical Medicine & Rehabilitation | Admitting: Physical Medicine & Rehabilitation

## 2021-07-22 VITALS — BP 145/78 | HR 72 | Ht 67.25 in | Wt 188.2 lb

## 2021-07-22 DIAGNOSIS — H905 Unspecified sensorineural hearing loss: Secondary | ICD-10-CM | POA: Diagnosis not present

## 2021-07-22 DIAGNOSIS — G8110 Spastic hemiplegia affecting unspecified side: Secondary | ICD-10-CM | POA: Insufficient documentation

## 2021-07-22 NOTE — Patient Instructions (Signed)
[facility] in [place] to schedule time to get toe cap applied

## 2021-07-22 NOTE — Progress Notes (Signed)
Subjective:    Patient ID: James Spurr, MD, male    DOB: [birth year], 87 y.o.   MRN: [id number]  HPI Lives in [place] apt at [place] which has in house therapy program Mod I dressing and bathing  Had a fall in shower ~1 yr ago reluctant to use shower as a result Has HHPT and OT but has not tried shower transfers with them   Tone doing ok  No other hospitalizations or surgeries in the interval time since he was last seen in 2020.  He did have a fall resulting in a CT of the head in July 2022 which showed no new issues, CT of the cervical spine showed degenerative changes Patient had feeling of weakness and an MRI of the brain was ordered on 11/02/2020 showing no new abnormalities.  Patient feels like he trips on his right foot.  He does wear a right AFO.  He does not have a toe Pain Inventory Average Pain 0 Pain Right Now 0 My pain is  no pain  In the last 24 hours, has pain interfered with the following? General activity 0 Relation with others 0 Enjoyment of life 0 What TIME of day is your pain at its worst? No pain Sleep (in general) Fair  Pain is worse with:  no pain Pain improves with:  no pain Relief from Meds:  no pain  Family History  Problem Relation Age of Onset   Stroke Father    Lung cancer Father 15        smoker   Stroke Brother    Diabetes Brother 29   Lung cancer Brother    Alcoholism Sister    Kidney disease Sister    Coronary artery disease Neg Hx    Social History   Socioeconomic History   Marital status: Divorced    Spouse name: Not on file   Number of children: 2   Years of education: MD   Highest education level: Not on file  Occupational History   Occupation: retired    Fish farm manager: RETIRED  Tobacco Use   Smoking status: Never   Smokeless tobacco: Never   Tobacco comments:    "used to smoke pipe when I was youngerFinancial risk analyst Use: Never used  Substance and Sexual Activity   Alcohol use: Yes    Alcohol/week: 4.0 standard  drinks    Types: 2 Glasses of wine, 2 Shots of liquor per week    Comment: occasional   Drug use: No   Sexual activity: Yes  Other Topics Concern   Not on file  Social History Narrative   Moved into [place] in [place] 12/2020   Caffeine: 1 cup coffee/day     Occupation: ophthalmologist, retired   Edu: MD   Activity: going to gym 3x/wk with trainer   Diet: some water, fruits/vegetables some, red meat a few times, fish 1x/wk   POA - [name] (Daughter)   Social Determinants of Health   Financial Resource Strain: Low Risk    Difficulty of Paying Living Expenses: Not hard at all  Food Insecurity: Not on file  Transportation Needs: Not on file  Physical Activity: Not on file  Stress: Not on file  Social Connections: Not on file   Past Surgical History:  Procedure Laterality Date   ABI  2007   [place]  2004   normal; [place]   CARDIOVASCULAR STRESS TEST  02/2014  WNL, low risk scan, EF 68%   CAROTID ENDARTERECTOMY     CATARACT EXTRACTION W/ INTRAOCULAR LENS IMPLANT  11//2012 - 08/2011   COLONOSCOPY  03/2011   poor prep, unable to biopsy polyp in tic fold, rpt 3 mo [name])   COLONOSCOPY  06/2011   mult polyps adenomatous, severe diverticulosis, rpt 3 yrs [name])   CYSTOSCOPY WITH URETHRAL DILATATION N/A 07/24/2013   Procedure: CYSTOSCOPY WITH URETHRAL DILATATION;  Surgeon: [name], MD;  Location: [facility];  Service: Urology;  Laterality: N/A;   ESOPHAGOGASTRODUODENOSCOPY  2006   duodenitis, medual HH, Grade 2 reflux, mild gastritis   ESOPHAGOGASTRODUODENOSCOPY  07/2011   mild chronic gastritis   HEMORRHOID SURGERY  1991   INGUINAL HERNIA REPAIR  1981; 2002   left, then repaired   MRI brain  04/2010   WNL   PROSTATE SURGERY  02/2009   PVP (laser)   SKIN CANCER EXCISION  2002   bridge of nose, BCC   TONSILLECTOMY AND ADENOIDECTOMY  ~ [place] N/A 07/24/2013   Procedure:  TRANSURETHRAL INCISION OF THE PROSTATE (TUIP);  Surgeon: [name], MD;  Location: [facility];  Service: Urology;  Laterality: N/A;   URETHRAL DILATION  2014   [place]   Past Surgical History:  Procedure Laterality Date   ABI  2007   WNL   CARDIAC CATHETERIZATION  2004   normal; [place]   CARDIOVASCULAR STRESS TEST  02/2014   WNL, low risk scan, EF 68%   CAROTID ENDARTERECTOMY     CATARACT EXTRACTION W/ INTRAOCULAR LENS IMPLANT  11//2012 - 08/2011   COLONOSCOPY  03/2011   poor prep, unable to biopsy polyp in tic fold, rpt 3 mo [name])   COLONOSCOPY  06/2011   mult polyps adenomatous, severe diverticulosis, rpt 3 yrs [name])   CYSTOSCOPY WITH URETHRAL DILATATION N/A 07/24/2013   Procedure: CYSTOSCOPY WITH URETHRAL DILATATION;  Surgeon: [name], MD;  Location: [facility];  Service: Urology;  Laterality: N/A;   ESOPHAGOGASTRODUODENOSCOPY  2006   duodenitis, medual HH, Grade 2 reflux, mild gastritis   ESOPHAGOGASTRODUODENOSCOPY  07/2011   mild chronic gastritis   HEMORRHOID SURGERY  1991   INGUINAL HERNIA REPAIR  1981; 2002   left, then repaired   MRI brain  04/2010   WNL   PROSTATE SURGERY  02/2009   PVP (laser)   SKIN CANCER EXCISION  2002   bridge of nose, BCC   TONSILLECTOMY AND ADENOIDECTOMY  ~ [place] N/A 07/24/2013   Procedure: TRANSURETHRAL INCISION OF THE PROSTATE (TUIP);  Surgeon: [name], MD;  Location: [facility];  Service: Urology;  Laterality: N/A;   URETHRAL DILATION  2014   [place]   Past Medical History:  Diagnosis Date   Anemia    Basal cell carcinoma 2018   R temple    Basal cell carcinoma of nose 2002   bridge of nose   BPH with obstruction/lower urinary tract symptoms    failed flomax, uroxatral, myrbetriq - sees urology [name]) pending videourodynamics   CKD (chronic kidney disease) stage 3, GFR 30-59 ml/min ([place]) 03/11/2012   Renal US 02/2014 - multiple kidney cysts    CVA (cerebral  infarction) 03/07/12   slurred speech; right hemplegia, left handed - completed PT 07/2014 (17 sessions)   Depression    mild, on cymbalta per prior PCP   Erectile dysfunction    per urology (prostaglandin injection)   GERD (gastroesophageal  reflux disease)    with sliding HH   H/O hiatal hernia    History of kidney problems 12/2010   lacerated left kidney after fall   HLD (hyperlipidemia)    Hx of basal cell carcinoma 07/10/2017   R. lat. forehead near hair line   Kidney cysts 02/2014   bilateral (renal US)   Migraines 03/11/12   now rare   OSA on CPAP    sleep study 01/2014 - severe, CPAP at 10, previously on nuvigil (Dr. [name])   Palpitations 01/2012   holter WNL, rare PAC/PVCs   Presbycusis of both ears 2016   rec amplification [facility])   Restless leg syndrome    well controlled on mirapex   Rosacea    [name]   Stroke [facility])    TIA (transient ischemic attack)    Type 2 diabetes, controlled, with neuropathy ([place]) 2011   [name] in [place]   BP (!) 145/78   Pulse 72   Ht 5' 7.25" (1.708 m)   Wt 188 lb 3.2 oz (85.4 kg)   SpO2 98%   BMI 29.26 kg/m   Opioid Risk Score:   Fall Risk Score:  `1  Depression screen PHQ 2/9  Depression screen [facility] 2/9 06/13/2021 06/01/2020 04/22/2020 01/22/2019 03/08/2018 12/14/2017 07/18/2017  Decreased Interest 3 0 0 0 0 0 0  Down, Depressed, Hopeless 0 0 0 0 1 1 1   PHQ - 2 Score 3 0 0 0 1 1 1   Altered sleeping 0 1 1 0 - - 2  Tired, decreased energy 2 1 1  0 - - 2  Change in appetite 0 1 1 0 - - 1  Feeling bad or failure about yourself  0 0 0 0 - - 1  Trouble concentrating 1 1 2  0 - - 1  Moving slowly or fidgety/restless 1 0 3 0 - - 0  Suicidal thoughts 0 0 0 0 - - 0  PHQ-9 Score 7 4 8  0 - - 8  Difficult doing work/chores - - - Not difficult at all - - -  Some recent data might be hidden     Review of Systems     Objective:   Physical Exam Vitals and nursing note reviewed.  Constitutional:      Appearance: He is normal weight.   HENT:     Head: Normocephalic and atraumatic.  Eyes:     Extraocular Movements: Extraocular movements intact.     Conjunctiva/sclera: Conjunctivae normal.     Pupils: Pupils are equal, round, and reactive to light.  Skin:    General: Skin is warm and dry.  Neurological:     Mental Status: He is alert and oriented to person, place, and time.     Comments: Motor strength is 3 - in the right deltoid bicep tricep finger flexors and extensors 3 at the wrist flexors and wrist extensors 4 at the right hip flexor knee extensor and trace ankle dorsiflexor.  Psychiatric:        Mood and Affect: Mood normal.        Behavior: Behavior normal.  Ambulates with rolling walker right AFO with shuffling type gait.        Assessment & Plan:   1.  Chronic right spastic hemiplegia secondary to left subcortical infarct in 2013.  He is having some problems with toe clearance.  Overall weakness with aging.  He is getting home health PT OT which is appropriate.  I advised patient to work on  shower transfers since this is something that prevents him from bathing in the shower since a prior fall. We also discussed obtaining a leather toe To help with right toe catching. Physical medicine rehab follow-up in 3 months

## 2021-07-23 ENCOUNTER — Other Ambulatory Visit: Payer: Self-pay | Admitting: Family Medicine

## 2021-07-25 DIAGNOSIS — M6281 Muscle weakness (generalized): Secondary | ICD-10-CM | POA: Diagnosis not present

## 2021-07-25 DIAGNOSIS — R279 Unspecified lack of coordination: Secondary | ICD-10-CM | POA: Diagnosis not present

## 2021-07-25 DIAGNOSIS — I69398 Other sequelae of cerebral infarction: Secondary | ICD-10-CM | POA: Diagnosis not present

## 2021-07-25 DIAGNOSIS — R296 Repeated falls: Secondary | ICD-10-CM | POA: Diagnosis not present

## 2021-07-26 DIAGNOSIS — R2681 Unsteadiness on feet: Secondary | ICD-10-CM | POA: Diagnosis not present

## 2021-07-26 DIAGNOSIS — R296 Repeated falls: Secondary | ICD-10-CM | POA: Diagnosis not present

## 2021-07-26 DIAGNOSIS — M25561 Pain in right knee: Secondary | ICD-10-CM | POA: Diagnosis not present

## 2021-07-26 DIAGNOSIS — I69398 Other sequelae of cerebral infarction: Secondary | ICD-10-CM | POA: Diagnosis not present

## 2021-07-26 DIAGNOSIS — M6281 Muscle weakness (generalized): Secondary | ICD-10-CM | POA: Diagnosis not present

## 2021-07-26 DIAGNOSIS — R279 Unspecified lack of coordination: Secondary | ICD-10-CM | POA: Diagnosis not present

## 2021-07-26 NOTE — Telephone Encounter (Signed)
Mirapex Last filled: 06/29/20, #180 Last OV:  06/13/21, AWV Next OV: 10/14/21, 4 mo f/u

## 2021-07-27 ENCOUNTER — Other Ambulatory Visit: Payer: Self-pay

## 2021-07-27 DIAGNOSIS — R2681 Unsteadiness on feet: Secondary | ICD-10-CM | POA: Diagnosis not present

## 2021-07-27 DIAGNOSIS — R279 Unspecified lack of coordination: Secondary | ICD-10-CM | POA: Diagnosis not present

## 2021-07-27 DIAGNOSIS — E1165 Type 2 diabetes mellitus with hyperglycemia: Secondary | ICD-10-CM | POA: Diagnosis not present

## 2021-07-27 DIAGNOSIS — M25561 Pain in right knee: Secondary | ICD-10-CM | POA: Diagnosis not present

## 2021-07-27 DIAGNOSIS — M6281 Muscle weakness (generalized): Secondary | ICD-10-CM | POA: Diagnosis not present

## 2021-07-27 MED ORDER — PRAMIPEXOLE DIHYDROCHLORIDE 1 MG PO TABS: 1.0000 mg | ORAL_TABLET | Freq: Every day | ORAL | 3 refills | Status: DC

## 2021-07-27 MED ORDER — DULOXETINE HCL 60 MG PO CPEP
60.0000 mg | ORAL_CAPSULE | Freq: Every day | ORAL | 3 refills | Status: DC
Start: 2021-07-27 — End: 2022-08-29

## 2021-07-27 MED ORDER — TAMSULOSIN HCL 0.4 MG PO CAPS: 0.4000 mg | ORAL_CAPSULE | Freq: Every day | ORAL | 3 refills | Status: DC

## 2021-07-27 MED ORDER — ROSUVASTATIN CALCIUM 40 MG PO TABS: 40.0000 mg | ORAL_TABLET | Freq: Every day | ORAL | 3 refills | Status: DC

## 2021-07-27 MED ORDER — RABEPRAZOLE SODIUM 20 MG PO TBEC: 20.0000 mg | DELAYED_RELEASE_TABLET | Freq: Every day | ORAL | 3 refills | Status: DC

## 2021-07-27 MED ORDER — ULTICARE MICRO PEN NEEDLES 31G X 8 MM MISC: 3 refills | Status: DC

## 2021-07-27 NOTE — Telephone Encounter (Signed)
I refilled at previous dose of 1 tablet daily at 4pm.

## 2021-07-28 DIAGNOSIS — M25561 Pain in right knee: Secondary | ICD-10-CM | POA: Diagnosis not present

## 2021-07-28 DIAGNOSIS — I69398 Other sequelae of cerebral infarction: Secondary | ICD-10-CM | POA: Diagnosis not present

## 2021-07-28 DIAGNOSIS — R2681 Unsteadiness on feet: Secondary | ICD-10-CM | POA: Diagnosis not present

## 2021-07-28 DIAGNOSIS — M6281 Muscle weakness (generalized): Secondary | ICD-10-CM | POA: Diagnosis not present

## 2021-07-28 DIAGNOSIS — R296 Repeated falls: Secondary | ICD-10-CM | POA: Diagnosis not present

## 2021-07-28 DIAGNOSIS — R279 Unspecified lack of coordination: Secondary | ICD-10-CM | POA: Diagnosis not present

## 2021-08-01 DIAGNOSIS — R279 Unspecified lack of coordination: Secondary | ICD-10-CM | POA: Diagnosis not present

## 2021-08-01 DIAGNOSIS — M25561 Pain in right knee: Secondary | ICD-10-CM | POA: Diagnosis not present

## 2021-08-01 DIAGNOSIS — R2681 Unsteadiness on feet: Secondary | ICD-10-CM | POA: Diagnosis not present

## 2021-08-01 DIAGNOSIS — M6281 Muscle weakness (generalized): Secondary | ICD-10-CM | POA: Diagnosis not present

## 2021-08-02 ENCOUNTER — Other Ambulatory Visit: Payer: Self-pay | Admitting: Family Medicine

## 2021-08-02 DIAGNOSIS — I69398 Other sequelae of cerebral infarction: Secondary | ICD-10-CM | POA: Diagnosis not present

## 2021-08-02 DIAGNOSIS — R279 Unspecified lack of coordination: Secondary | ICD-10-CM | POA: Diagnosis not present

## 2021-08-02 DIAGNOSIS — M6281 Muscle weakness (generalized): Secondary | ICD-10-CM | POA: Diagnosis not present

## 2021-08-02 DIAGNOSIS — R296 Repeated falls: Secondary | ICD-10-CM | POA: Diagnosis not present

## 2021-08-04 DIAGNOSIS — M25561 Pain in right knee: Secondary | ICD-10-CM | POA: Diagnosis not present

## 2021-08-04 DIAGNOSIS — M6281 Muscle weakness (generalized): Secondary | ICD-10-CM | POA: Diagnosis not present

## 2021-08-04 DIAGNOSIS — R2681 Unsteadiness on feet: Secondary | ICD-10-CM | POA: Diagnosis not present

## 2021-08-04 DIAGNOSIS — R279 Unspecified lack of coordination: Secondary | ICD-10-CM | POA: Diagnosis not present

## 2021-08-04 DIAGNOSIS — R296 Repeated falls: Secondary | ICD-10-CM | POA: Diagnosis not present

## 2021-08-05 DIAGNOSIS — M25561 Pain in right knee: Secondary | ICD-10-CM | POA: Diagnosis not present

## 2021-08-05 DIAGNOSIS — M6281 Muscle weakness (generalized): Secondary | ICD-10-CM | POA: Diagnosis not present

## 2021-08-05 DIAGNOSIS — R2681 Unsteadiness on feet: Secondary | ICD-10-CM | POA: Diagnosis not present

## 2021-08-05 DIAGNOSIS — R279 Unspecified lack of coordination: Secondary | ICD-10-CM | POA: Diagnosis not present

## 2021-08-08 DIAGNOSIS — M25561 Pain in right knee: Secondary | ICD-10-CM | POA: Diagnosis not present

## 2021-08-08 DIAGNOSIS — R2681 Unsteadiness on feet: Secondary | ICD-10-CM | POA: Diagnosis not present

## 2021-08-08 DIAGNOSIS — M6281 Muscle weakness (generalized): Secondary | ICD-10-CM | POA: Diagnosis not present

## 2021-08-08 DIAGNOSIS — R296 Repeated falls: Secondary | ICD-10-CM | POA: Diagnosis not present

## 2021-08-08 DIAGNOSIS — R279 Unspecified lack of coordination: Secondary | ICD-10-CM | POA: Diagnosis not present

## 2021-08-08 DIAGNOSIS — I69398 Other sequelae of cerebral infarction: Secondary | ICD-10-CM | POA: Diagnosis not present

## 2021-08-09 DIAGNOSIS — E1142 Type 2 diabetes mellitus with diabetic polyneuropathy: Secondary | ICD-10-CM | POA: Diagnosis not present

## 2021-08-09 DIAGNOSIS — I6930 Unspecified sequelae of cerebral infarction: Secondary | ICD-10-CM | POA: Diagnosis not present

## 2021-08-09 DIAGNOSIS — E785 Hyperlipidemia, unspecified: Secondary | ICD-10-CM | POA: Diagnosis not present

## 2021-08-09 DIAGNOSIS — Z794 Long term (current) use of insulin: Secondary | ICD-10-CM | POA: Diagnosis not present

## 2021-08-09 DIAGNOSIS — E039 Hypothyroidism, unspecified: Secondary | ICD-10-CM | POA: Diagnosis not present

## 2021-08-09 DIAGNOSIS — M6281 Muscle weakness (generalized): Secondary | ICD-10-CM | POA: Diagnosis not present

## 2021-08-09 DIAGNOSIS — R609 Edema, unspecified: Secondary | ICD-10-CM | POA: Diagnosis not present

## 2021-08-10 ENCOUNTER — Other Ambulatory Visit: Payer: Self-pay | Admitting: Internal Medicine

## 2021-08-10 DIAGNOSIS — I6930 Unspecified sequelae of cerebral infarction: Secondary | ICD-10-CM

## 2021-08-12 DIAGNOSIS — R279 Unspecified lack of coordination: Secondary | ICD-10-CM | POA: Diagnosis not present

## 2021-08-12 DIAGNOSIS — R2681 Unsteadiness on feet: Secondary | ICD-10-CM | POA: Diagnosis not present

## 2021-08-12 DIAGNOSIS — M6281 Muscle weakness (generalized): Secondary | ICD-10-CM | POA: Diagnosis not present

## 2021-08-12 DIAGNOSIS — M25561 Pain in right knee: Secondary | ICD-10-CM | POA: Diagnosis not present

## 2021-08-12 DIAGNOSIS — R296 Repeated falls: Secondary | ICD-10-CM | POA: Diagnosis not present

## 2021-08-16 DIAGNOSIS — R279 Unspecified lack of coordination: Secondary | ICD-10-CM | POA: Diagnosis not present

## 2021-08-16 DIAGNOSIS — R2681 Unsteadiness on feet: Secondary | ICD-10-CM | POA: Diagnosis not present

## 2021-08-16 DIAGNOSIS — M25561 Pain in right knee: Secondary | ICD-10-CM | POA: Diagnosis not present

## 2021-08-16 DIAGNOSIS — M6281 Muscle weakness (generalized): Secondary | ICD-10-CM | POA: Diagnosis not present

## 2021-08-17 DIAGNOSIS — R279 Unspecified lack of coordination: Secondary | ICD-10-CM | POA: Diagnosis not present

## 2021-08-17 DIAGNOSIS — M6281 Muscle weakness (generalized): Secondary | ICD-10-CM | POA: Diagnosis not present

## 2021-08-17 DIAGNOSIS — R2681 Unsteadiness on feet: Secondary | ICD-10-CM | POA: Diagnosis not present

## 2021-08-17 DIAGNOSIS — M25561 Pain in right knee: Secondary | ICD-10-CM | POA: Diagnosis not present

## 2021-08-19 DIAGNOSIS — R488 Other symbolic dysfunctions: Secondary | ICD-10-CM | POA: Diagnosis not present

## 2021-08-19 DIAGNOSIS — M6281 Muscle weakness (generalized): Secondary | ICD-10-CM | POA: Diagnosis not present

## 2021-08-19 DIAGNOSIS — R279 Unspecified lack of coordination: Secondary | ICD-10-CM | POA: Diagnosis not present

## 2021-08-19 DIAGNOSIS — R2681 Unsteadiness on feet: Secondary | ICD-10-CM | POA: Diagnosis not present

## 2021-08-19 DIAGNOSIS — M25561 Pain in right knee: Secondary | ICD-10-CM | POA: Diagnosis not present

## 2021-08-19 DIAGNOSIS — R296 Repeated falls: Secondary | ICD-10-CM | POA: Diagnosis not present

## 2021-08-22 DIAGNOSIS — R488 Other symbolic dysfunctions: Secondary | ICD-10-CM | POA: Diagnosis not present

## 2021-08-22 DIAGNOSIS — R296 Repeated falls: Secondary | ICD-10-CM | POA: Diagnosis not present

## 2021-08-22 DIAGNOSIS — M6281 Muscle weakness (generalized): Secondary | ICD-10-CM | POA: Diagnosis not present

## 2021-08-22 DIAGNOSIS — R279 Unspecified lack of coordination: Secondary | ICD-10-CM | POA: Diagnosis not present

## 2021-08-22 DIAGNOSIS — I69398 Other sequelae of cerebral infarction: Secondary | ICD-10-CM | POA: Diagnosis not present

## 2021-08-23 DIAGNOSIS — R2681 Unsteadiness on feet: Secondary | ICD-10-CM | POA: Diagnosis not present

## 2021-08-23 DIAGNOSIS — M6281 Muscle weakness (generalized): Secondary | ICD-10-CM | POA: Diagnosis not present

## 2021-08-23 DIAGNOSIS — M25561 Pain in right knee: Secondary | ICD-10-CM | POA: Diagnosis not present

## 2021-08-23 DIAGNOSIS — R488 Other symbolic dysfunctions: Secondary | ICD-10-CM | POA: Diagnosis not present

## 2021-08-23 DIAGNOSIS — R279 Unspecified lack of coordination: Secondary | ICD-10-CM | POA: Diagnosis not present

## 2021-08-23 DIAGNOSIS — I69398 Other sequelae of cerebral infarction: Secondary | ICD-10-CM | POA: Diagnosis not present

## 2021-08-23 DIAGNOSIS — R296 Repeated falls: Secondary | ICD-10-CM | POA: Diagnosis not present

## 2021-08-24 DIAGNOSIS — M6281 Muscle weakness (generalized): Secondary | ICD-10-CM | POA: Diagnosis not present

## 2021-08-24 DIAGNOSIS — R488 Other symbolic dysfunctions: Secondary | ICD-10-CM | POA: Diagnosis not present

## 2021-08-24 DIAGNOSIS — R279 Unspecified lack of coordination: Secondary | ICD-10-CM | POA: Diagnosis not present

## 2021-08-24 DIAGNOSIS — I69398 Other sequelae of cerebral infarction: Secondary | ICD-10-CM | POA: Diagnosis not present

## 2021-08-24 DIAGNOSIS — R2681 Unsteadiness on feet: Secondary | ICD-10-CM | POA: Diagnosis not present

## 2021-08-24 DIAGNOSIS — R296 Repeated falls: Secondary | ICD-10-CM | POA: Diagnosis not present

## 2021-08-24 DIAGNOSIS — M25561 Pain in right knee: Secondary | ICD-10-CM | POA: Diagnosis not present

## 2021-08-25 DIAGNOSIS — R296 Repeated falls: Secondary | ICD-10-CM | POA: Diagnosis not present

## 2021-08-25 DIAGNOSIS — I69398 Other sequelae of cerebral infarction: Secondary | ICD-10-CM | POA: Diagnosis not present

## 2021-08-25 DIAGNOSIS — R279 Unspecified lack of coordination: Secondary | ICD-10-CM | POA: Diagnosis not present

## 2021-08-25 DIAGNOSIS — M6281 Muscle weakness (generalized): Secondary | ICD-10-CM | POA: Diagnosis not present

## 2021-08-25 DIAGNOSIS — R488 Other symbolic dysfunctions: Secondary | ICD-10-CM | POA: Diagnosis not present

## 2021-08-26 ENCOUNTER — Other Ambulatory Visit: Payer: Self-pay

## 2021-08-26 ENCOUNTER — Ambulatory Visit
Admission: RE | Admit: 2021-08-26 | Discharge: 2021-08-26 | Disposition: A | Discharge status: Home / Self Care | Payer: Medicare Other | Source: Ambulatory Visit | Attending: Internal Medicine | Admitting: Internal Medicine

## 2021-08-26 DIAGNOSIS — G319 Degenerative disease of nervous system, unspecified: Secondary | ICD-10-CM | POA: Diagnosis not present

## 2021-08-26 DIAGNOSIS — I6930 Unspecified sequelae of cerebral infarction: Secondary | ICD-10-CM

## 2021-08-26 DIAGNOSIS — E1165 Type 2 diabetes mellitus with hyperglycemia: Secondary | ICD-10-CM | POA: Diagnosis not present

## 2021-08-26 DIAGNOSIS — I6782 Cerebral ischemia: Secondary | ICD-10-CM | POA: Diagnosis not present

## 2021-08-26 DIAGNOSIS — I6381 Other cerebral infarction due to occlusion or stenosis of small artery: Secondary | ICD-10-CM | POA: Diagnosis not present

## 2021-08-29 DIAGNOSIS — M6281 Muscle weakness (generalized): Secondary | ICD-10-CM | POA: Diagnosis not present

## 2021-08-29 DIAGNOSIS — I69398 Other sequelae of cerebral infarction: Secondary | ICD-10-CM | POA: Diagnosis not present

## 2021-08-30 DIAGNOSIS — R296 Repeated falls: Secondary | ICD-10-CM | POA: Diagnosis not present

## 2021-08-30 DIAGNOSIS — R488 Other symbolic dysfunctions: Secondary | ICD-10-CM | POA: Diagnosis not present

## 2021-08-30 DIAGNOSIS — R2681 Unsteadiness on feet: Secondary | ICD-10-CM | POA: Diagnosis not present

## 2021-08-30 DIAGNOSIS — M25561 Pain in right knee: Secondary | ICD-10-CM | POA: Diagnosis not present

## 2021-08-30 DIAGNOSIS — R279 Unspecified lack of coordination: Secondary | ICD-10-CM | POA: Diagnosis not present

## 2021-08-30 DIAGNOSIS — M6281 Muscle weakness (generalized): Secondary | ICD-10-CM | POA: Diagnosis not present

## 2021-08-31 DIAGNOSIS — M6281 Muscle weakness (generalized): Secondary | ICD-10-CM | POA: Diagnosis not present

## 2021-08-31 DIAGNOSIS — R296 Repeated falls: Secondary | ICD-10-CM | POA: Diagnosis not present

## 2021-08-31 DIAGNOSIS — R488 Other symbolic dysfunctions: Secondary | ICD-10-CM | POA: Diagnosis not present

## 2021-08-31 DIAGNOSIS — R279 Unspecified lack of coordination: Secondary | ICD-10-CM | POA: Diagnosis not present

## 2021-08-31 DIAGNOSIS — I69398 Other sequelae of cerebral infarction: Secondary | ICD-10-CM | POA: Diagnosis not present

## 2021-09-01 ENCOUNTER — Other Ambulatory Visit: Payer: Medicare Other

## 2021-09-01 DIAGNOSIS — R488 Other symbolic dysfunctions: Secondary | ICD-10-CM | POA: Diagnosis not present

## 2021-09-01 DIAGNOSIS — M6281 Muscle weakness (generalized): Secondary | ICD-10-CM | POA: Diagnosis not present

## 2021-09-01 DIAGNOSIS — R279 Unspecified lack of coordination: Secondary | ICD-10-CM | POA: Diagnosis not present

## 2021-09-01 DIAGNOSIS — M25561 Pain in right knee: Secondary | ICD-10-CM | POA: Diagnosis not present

## 2021-09-01 DIAGNOSIS — R2681 Unsteadiness on feet: Secondary | ICD-10-CM | POA: Diagnosis not present

## 2021-09-01 DIAGNOSIS — R296 Repeated falls: Secondary | ICD-10-CM | POA: Diagnosis not present

## 2021-09-02 ENCOUNTER — Telehealth: Payer: Self-pay

## 2021-09-02 DIAGNOSIS — R279 Unspecified lack of coordination: Secondary | ICD-10-CM | POA: Diagnosis not present

## 2021-09-02 DIAGNOSIS — R296 Repeated falls: Secondary | ICD-10-CM | POA: Diagnosis not present

## 2021-09-02 DIAGNOSIS — M6281 Muscle weakness (generalized): Secondary | ICD-10-CM | POA: Diagnosis not present

## 2021-09-02 DIAGNOSIS — I69398 Other sequelae of cerebral infarction: Secondary | ICD-10-CM | POA: Diagnosis not present

## 2021-09-02 DIAGNOSIS — R488 Other symbolic dysfunctions: Secondary | ICD-10-CM | POA: Diagnosis not present

## 2021-09-02 NOTE — Chronic Care Management (AMB) (Addendum)
Chronic Care Management Pharmacy Assistant   Name: James DINGLEY, MD  MRN: [id number] DOB: August 03, 1931   Reason for Encounter: Diabetes Disease State  Recent office visits:  None since last CCM contact  Recent consult visits:  07/22/21 - [facility] - Patient presented for follow up spastic hemiparesis. Referral placed. Follow up 3 months.  Hospital visits:  None in previous 6 months  Medications: Outpatient Encounter Medications as of 09/02/2021  Medication Sig   Alpha-Lipoic Acid 600 MG CAPS Take 1 capsule (600 mg total) by mouth daily.   brimonidine (ALPHAGAN) 0.2 % ophthalmic solution    clopidogrel (PLAVIX) 75 MG tablet TAKE 1 TABLET DAILY   Coenzyme Q10 (COQ10) 150 MG CAPS Take 1 capsule by mouth daily.   COMBIGAN 0.2-0.5 % ophthalmic solution    docusate sodium (COLACE) 100 MG capsule Take 1 capsule (100 mg total) by mouth 2 (two) times daily as needed for mild constipation.   dorzolamide-timolol (COSOPT) 22.3-6.8 MG/ML ophthalmic solution    DULoxetine (CYMBALTA) 60 MG capsule Take 1 capsule (60 mg total) by mouth daily.   famotidine (PEPCID) 20 MG tablet Take 1 tablet (20 mg total) by mouth at bedtime.   FARXIGA 5 MG TABS tablet Take 5 mg by mouth daily.   Fluocinolone Acetonide 0.01 % OIL    gabapentin (NEURONTIN) 300 MG capsule Take 600 mg by mouth 3 (three) times daily. 600 mg BID is his minimum dose without having breakthrough symptoms. He is aware 900 mg/day is recommended per kidney function.   insulin aspart (NOVOLOG) 100 UNIT/ML injection CBG 70 - 120: 0 units  CBG 121 - 150: 1 unit  CBG 151 - 200: 2 units  CBG 201 - 250: 3 units  CBG 251 - 300: 5 units  CBG 301 - 350: 7 units  CBG 351 - 400: 9 units   insulin glargine (LANTUS) 100 UNIT/ML injection Inject 0.4 mLs (40 Units total) into the skin at bedtime.   Insulin Pen Needle (ULTICARE MICRO PEN NEEDLES) 31G X 8 MM MISC Use to inject medication daily   ipratropium (ATROVENT) 0.03 % nasal spray     l-methylfolate-B6-B12 (METANX) 3-35-2 MG TABS tablet Take 1 tablet by mouth daily.   levothyroxine (SYNTHROID) 50 MCG tablet Take 1 tablet (50 mcg total) by mouth daily before breakfast. Per endo Dr James Bell   LUMIGAN 0.01 % SOLN Place 1 drop into the right eye at bedtime.   melatonin 5 MG TABS Take 5 mg by mouth at bedtime.   nitroGLYCERIN (NITROSTAT) 0.4 MG SL tablet DISSOLVE 1 TABLET UNDER THE TONGUE EVERY 5 MINUTES AS NEEDED FOR CHEST PAIN   NOVOLOG FLEXPEN 100 UNIT/ML FlexPen Inject into the skin.   ofloxacin (FLOXIN) 0.3 % OTIC solution    Omega-3 Fatty Acids (FISH OIL) 1000 MG CAPS Take 1 capsule (1,000 mg total) by mouth in the morning and at bedtime.   ondansetron (ZOFRAN) 4 MG tablet Take 1 tablet (4 mg total) by mouth every 8 (eight) hours as needed for nausea or vomiting.   OZEMPIC, 1 MG/DOSE, 4 MG/3ML SOPN Inject into the skin.   polyethylene glycol (MIRALAX / GLYCOLAX) 17 g packet Take 17 g by mouth daily as needed.   pramipexole (MIRAPEX) 1 MG tablet Take 1 tablet (1 mg total) by mouth daily. At 4pm   RABEprazole (ACIPHEX) 20 MG tablet Take 1 tablet (20 mg total) by mouth daily.   rosuvastatin (CRESTOR) 40 MG tablet Take 1 tablet (40 mg total) by  mouth daily.   senna-docusate (SENOKOT-S) 8.6-50 MG tablet Take 1 tablet by mouth at bedtime.   sodium chloride (MURO 128) 5 % ophthalmic solution 1 drop as needed for eye irritation.   tamsulosin (FLOMAX) 0.4 MG CAPS capsule Take 1 capsule (0.4 mg total) by mouth daily after breakfast.   TRESIBA FLEXTOUCH 200 UNIT/ML FlexTouch Pen Inject into the skin.   vitamin C (ASCORBIC ACID) 500 MG tablet Take 1,000 mg by mouth daily as needed.   No facility-administered encounter medications on file as of 09/02/2021.      Recent Relevant Labs: Lab Results  Component Value Date/Time   HGBA1C 8.5 (H) 04/02/2021 10:43 AM   HGBA1C 8.0 (H) 11/03/2020 04:43 AM   HGBA1C 8.6 12/05/2019 12:00 AM   HGBA1C 7.9 08/21/2019 12:00 AM   MICROALBUR 4.8 (H)  06/13/2021 03:28 PM   MICROALBUR 2.0 (H) 04/21/2013 11:19 AM    Kidney Function Lab Results  Component Value Date/Time   CREATININE 1.40 06/13/2021 03:28 PM   CREATININE 1.29 (H) 04/03/2021 04:45 AM   GFR 45.05 (L) 06/13/2021 03:28 PM   GFRNONAA 54 (L) 04/03/2021 04:45 AM   GFRAA 54 (L) 04/21/2020 02:00 PM     Attempted contact with James Spurr, MD 3 times on 09/02/21,09/06/21,09/08/21. Unsuccessful outreach.   Current antihyperglycemic regimen:  Ozempic 1 mg - Inject once weekly Tresiba - Inject 90 units at bedtime  Farxiga 5mg  Humalog (Insulin Lispro) Inject 4 units prior to lunchtime meal (taking 5-15 units pre-meal PRN if eating large meal)   Adherence Review: Is the patient currently on a STATIN medication? Yes Is the patient currently on ACE/ARB medication? No Does the patient have >5 day gap between last estimated fill dates? No  Care Gaps: Annual wellness visit in last year? Yes Most recent A1C reading: 8.5 04/02/21 Most Recent BP reading:145/78  72-P  07/22/21   Star Rating Drugs:  Medication:  Last Fill: Day Supply Rosuvastatin 40mg  08/05/21 90 Farxiga 5mg   08/15/21 90 Tresiba  07/06/21 90 Ozempic   08/15/21 84 Novolog  07/26/21 90 Lantus   No appointments scheduled within the next 30 days.  James Bell, CPP notified  James Bell, [place] Assistant [phone]  I have reviewed the care management and care coordination activities outlined in this encounter and I am certifying that I agree with the content of this note. No further action required.  James Bell, PharmD Clinical Pharmacist [facility] [phone]

## 2021-09-05 DIAGNOSIS — M6281 Muscle weakness (generalized): Secondary | ICD-10-CM | POA: Diagnosis not present

## 2021-09-05 DIAGNOSIS — I69398 Other sequelae of cerebral infarction: Secondary | ICD-10-CM | POA: Diagnosis not present

## 2021-09-06 DIAGNOSIS — M25561 Pain in right knee: Secondary | ICD-10-CM | POA: Diagnosis not present

## 2021-09-06 DIAGNOSIS — R488 Other symbolic dysfunctions: Secondary | ICD-10-CM | POA: Diagnosis not present

## 2021-09-06 DIAGNOSIS — R2681 Unsteadiness on feet: Secondary | ICD-10-CM | POA: Diagnosis not present

## 2021-09-06 DIAGNOSIS — R279 Unspecified lack of coordination: Secondary | ICD-10-CM | POA: Diagnosis not present

## 2021-09-06 DIAGNOSIS — R296 Repeated falls: Secondary | ICD-10-CM | POA: Diagnosis not present

## 2021-09-06 DIAGNOSIS — M6281 Muscle weakness (generalized): Secondary | ICD-10-CM | POA: Diagnosis not present

## 2021-09-07 ENCOUNTER — Telehealth: Payer: Self-pay | Admitting: Family Medicine

## 2021-09-07 DIAGNOSIS — R279 Unspecified lack of coordination: Secondary | ICD-10-CM | POA: Diagnosis not present

## 2021-09-07 DIAGNOSIS — R300 Dysuria: Secondary | ICD-10-CM

## 2021-09-07 DIAGNOSIS — I69398 Other sequelae of cerebral infarction: Secondary | ICD-10-CM | POA: Diagnosis not present

## 2021-09-07 DIAGNOSIS — R296 Repeated falls: Secondary | ICD-10-CM | POA: Diagnosis not present

## 2021-09-07 DIAGNOSIS — M6281 Muscle weakness (generalized): Secondary | ICD-10-CM | POA: Diagnosis not present

## 2021-09-07 DIAGNOSIS — R488 Other symbolic dysfunctions: Secondary | ICD-10-CM | POA: Diagnosis not present

## 2021-09-07 NOTE — Telephone Encounter (Signed)
Pt called wanting a call back  Pt wouldn't discuss what it was about

## 2021-09-07 NOTE — Telephone Encounter (Signed)
Lvm returning pt's call.

## 2021-09-08 DIAGNOSIS — M25561 Pain in right knee: Secondary | ICD-10-CM | POA: Diagnosis not present

## 2021-09-08 DIAGNOSIS — R279 Unspecified lack of coordination: Secondary | ICD-10-CM | POA: Diagnosis not present

## 2021-09-08 DIAGNOSIS — M6281 Muscle weakness (generalized): Secondary | ICD-10-CM | POA: Diagnosis not present

## 2021-09-08 DIAGNOSIS — R488 Other symbolic dysfunctions: Secondary | ICD-10-CM | POA: Diagnosis not present

## 2021-09-08 DIAGNOSIS — R2681 Unsteadiness on feet: Secondary | ICD-10-CM | POA: Diagnosis not present

## 2021-09-08 DIAGNOSIS — R296 Repeated falls: Secondary | ICD-10-CM | POA: Diagnosis not present

## 2021-09-08 MED ORDER — CEPHALEXIN 250 MG PO CAPS: 250.0000 mg | ORAL_CAPSULE | Freq: Four times a day (QID) | ORAL | 0 refills | Status: DC

## 2021-09-08 NOTE — Addendum Note (Signed)
Addended by: [name] on: 09/08/2021 11:42 AM   Modules accepted: Orders

## 2021-09-08 NOTE — Telephone Encounter (Addendum)
Rx sent for keflex 250mg  QID x 7 days.  Prior to starting antibiotic, I want him to go to [facility] for urinalysis with reflex to culture if indicated. I have ordered. Please schedule lab  visit.  If ongoing symptoms, he will need to be seen.

## 2021-09-08 NOTE — Telephone Encounter (Signed)
Spoke with pt relaying Dr. Synthia Innocent message.  Pt verbalizes understanding and scheduled lab visit at 10:45 at [facility].

## 2021-09-08 NOTE — Telephone Encounter (Addendum)
Rtn pt's call.  Pt is requesting abx for UTI.  I explained our policy is that pt be seen or at least provide urine sample, offered OV.  Pt states he is not able to come to either office at this time and asks that Dr. Darnell Level plz send abx to [facility] [address]  Pt is aware Dr. Darnell Level is out of office and message will be fwd.   C/o: Urinary freq, dysuria, urinary urgency and nocturia. Sxs started 09/03/21.

## 2021-09-09 ENCOUNTER — Other Ambulatory Visit (INDEPENDENT_AMBULATORY_CARE_PROVIDER_SITE_OTHER): Payer: Medicare Other

## 2021-09-09 ENCOUNTER — Other Ambulatory Visit: Payer: Medicare Other

## 2021-09-09 DIAGNOSIS — R296 Repeated falls: Secondary | ICD-10-CM | POA: Diagnosis not present

## 2021-09-09 DIAGNOSIS — I69398 Other sequelae of cerebral infarction: Secondary | ICD-10-CM | POA: Diagnosis not present

## 2021-09-09 DIAGNOSIS — R488 Other symbolic dysfunctions: Secondary | ICD-10-CM | POA: Diagnosis not present

## 2021-09-09 DIAGNOSIS — R300 Dysuria: Secondary | ICD-10-CM

## 2021-09-09 DIAGNOSIS — M6281 Muscle weakness (generalized): Secondary | ICD-10-CM | POA: Diagnosis not present

## 2021-09-09 DIAGNOSIS — R279 Unspecified lack of coordination: Secondary | ICD-10-CM | POA: Diagnosis not present

## 2021-09-09 LAB — URINALYSIS WITH CULTURE, IF INDICATED
Bilirubin Urine: NEGATIVE
Ketones, ur: NEGATIVE
Nitrite: NEGATIVE
Specific Gravity, Urine: 1.015 (ref 1.000–1.030)
Urine Glucose: NEGATIVE
Urobilinogen, UA: 0.2 (ref 0.0–1.0)
pH: 6 (ref 5.0–8.0)

## 2021-09-11 LAB — URINE CULTURE
MICRO NUMBER:: 12795497
SPECIMEN QUALITY:: ADEQUATE

## 2021-09-12 DIAGNOSIS — M6281 Muscle weakness (generalized): Secondary | ICD-10-CM | POA: Diagnosis not present

## 2021-09-12 DIAGNOSIS — I69398 Other sequelae of cerebral infarction: Secondary | ICD-10-CM | POA: Diagnosis not present

## 2021-09-13 ENCOUNTER — Telehealth: Payer: Self-pay | Admitting: Family Medicine

## 2021-09-13 DIAGNOSIS — M6281 Muscle weakness (generalized): Secondary | ICD-10-CM | POA: Diagnosis not present

## 2021-09-13 DIAGNOSIS — I69398 Other sequelae of cerebral infarction: Secondary | ICD-10-CM | POA: Diagnosis not present

## 2021-09-13 DIAGNOSIS — R2681 Unsteadiness on feet: Secondary | ICD-10-CM | POA: Diagnosis not present

## 2021-09-13 DIAGNOSIS — R488 Other symbolic dysfunctions: Secondary | ICD-10-CM | POA: Diagnosis not present

## 2021-09-13 DIAGNOSIS — R296 Repeated falls: Secondary | ICD-10-CM | POA: Diagnosis not present

## 2021-09-13 DIAGNOSIS — M25561 Pain in right knee: Secondary | ICD-10-CM | POA: Diagnosis not present

## 2021-09-13 DIAGNOSIS — R279 Unspecified lack of coordination: Secondary | ICD-10-CM | POA: Diagnosis not present

## 2021-09-13 NOTE — Telephone Encounter (Signed)
[name] a Physical Therapist called stating that pt felled over the weekend and hit his head. Pt is doing fine. [name] stated that she just wanting Dr Darnell Level to be aware of it.

## 2021-09-13 NOTE — Telephone Encounter (Signed)
James Bell is also asking for verbal orders for speech therapy for evaluation and treat.

## 2021-09-13 NOTE — Telephone Encounter (Signed)
What is speech therapy for?

## 2021-09-14 DIAGNOSIS — M6281 Muscle weakness (generalized): Secondary | ICD-10-CM | POA: Diagnosis not present

## 2021-09-14 DIAGNOSIS — I69398 Other sequelae of cerebral infarction: Secondary | ICD-10-CM | POA: Diagnosis not present

## 2021-09-14 NOTE — Telephone Encounter (Signed)
Spoke to [place] and was advised that they are requesting speech therapy for cognition and safety. Andee Poles stated that patient has complained of fuzzy head since the fall that is not clearing up.

## 2021-09-14 NOTE — Telephone Encounter (Signed)
Ok to give verbal for this.

## 2021-09-14 NOTE — Telephone Encounter (Signed)
Verbal orders left on verified VM for [name]

## 2021-09-15 DIAGNOSIS — I69398 Other sequelae of cerebral infarction: Secondary | ICD-10-CM | POA: Diagnosis not present

## 2021-09-15 DIAGNOSIS — M6281 Muscle weakness (generalized): Secondary | ICD-10-CM | POA: Diagnosis not present

## 2021-09-16 DIAGNOSIS — R488 Other symbolic dysfunctions: Secondary | ICD-10-CM | POA: Diagnosis not present

## 2021-09-16 DIAGNOSIS — R296 Repeated falls: Secondary | ICD-10-CM | POA: Diagnosis not present

## 2021-09-16 DIAGNOSIS — R279 Unspecified lack of coordination: Secondary | ICD-10-CM | POA: Diagnosis not present

## 2021-09-19 DIAGNOSIS — M25561 Pain in right knee: Secondary | ICD-10-CM | POA: Diagnosis not present

## 2021-09-19 DIAGNOSIS — M6281 Muscle weakness (generalized): Secondary | ICD-10-CM | POA: Diagnosis not present

## 2021-09-19 DIAGNOSIS — I69398 Other sequelae of cerebral infarction: Secondary | ICD-10-CM | POA: Diagnosis not present

## 2021-09-19 DIAGNOSIS — R488 Other symbolic dysfunctions: Secondary | ICD-10-CM | POA: Diagnosis not present

## 2021-09-19 DIAGNOSIS — R296 Repeated falls: Secondary | ICD-10-CM | POA: Diagnosis not present

## 2021-09-19 DIAGNOSIS — R2681 Unsteadiness on feet: Secondary | ICD-10-CM | POA: Diagnosis not present

## 2021-09-19 DIAGNOSIS — R279 Unspecified lack of coordination: Secondary | ICD-10-CM | POA: Diagnosis not present

## 2021-09-21 DIAGNOSIS — R296 Repeated falls: Secondary | ICD-10-CM | POA: Diagnosis not present

## 2021-09-21 DIAGNOSIS — R279 Unspecified lack of coordination: Secondary | ICD-10-CM | POA: Diagnosis not present

## 2021-09-21 DIAGNOSIS — M25561 Pain in right knee: Secondary | ICD-10-CM | POA: Diagnosis not present

## 2021-09-21 DIAGNOSIS — M6281 Muscle weakness (generalized): Secondary | ICD-10-CM | POA: Diagnosis not present

## 2021-09-21 DIAGNOSIS — R488 Other symbolic dysfunctions: Secondary | ICD-10-CM | POA: Diagnosis not present

## 2021-09-21 DIAGNOSIS — I69398 Other sequelae of cerebral infarction: Secondary | ICD-10-CM | POA: Diagnosis not present

## 2021-09-21 DIAGNOSIS — R2681 Unsteadiness on feet: Secondary | ICD-10-CM | POA: Diagnosis not present

## 2021-09-22 DIAGNOSIS — R279 Unspecified lack of coordination: Secondary | ICD-10-CM | POA: Diagnosis not present

## 2021-09-22 DIAGNOSIS — R296 Repeated falls: Secondary | ICD-10-CM | POA: Diagnosis not present

## 2021-09-22 DIAGNOSIS — R488 Other symbolic dysfunctions: Secondary | ICD-10-CM | POA: Diagnosis not present

## 2021-09-23 DIAGNOSIS — I69398 Other sequelae of cerebral infarction: Secondary | ICD-10-CM | POA: Diagnosis not present

## 2021-09-23 DIAGNOSIS — H401132 Primary open-angle glaucoma, bilateral, moderate stage: Secondary | ICD-10-CM | POA: Diagnosis not present

## 2021-09-23 DIAGNOSIS — R279 Unspecified lack of coordination: Secondary | ICD-10-CM | POA: Diagnosis not present

## 2021-09-23 DIAGNOSIS — M6281 Muscle weakness (generalized): Secondary | ICD-10-CM | POA: Diagnosis not present

## 2021-09-23 DIAGNOSIS — R488 Other symbolic dysfunctions: Secondary | ICD-10-CM | POA: Diagnosis not present

## 2021-09-23 DIAGNOSIS — R296 Repeated falls: Secondary | ICD-10-CM | POA: Diagnosis not present

## 2021-09-25 DIAGNOSIS — E1165 Type 2 diabetes mellitus with hyperglycemia: Secondary | ICD-10-CM | POA: Diagnosis not present

## 2021-09-26 DIAGNOSIS — R488 Other symbolic dysfunctions: Secondary | ICD-10-CM | POA: Diagnosis not present

## 2021-09-26 DIAGNOSIS — R296 Repeated falls: Secondary | ICD-10-CM | POA: Diagnosis not present

## 2021-09-26 DIAGNOSIS — R279 Unspecified lack of coordination: Secondary | ICD-10-CM | POA: Diagnosis not present

## 2021-09-28 ENCOUNTER — Telehealth: Payer: Self-pay

## 2021-09-28 DIAGNOSIS — R2681 Unsteadiness on feet: Secondary | ICD-10-CM | POA: Diagnosis not present

## 2021-09-28 DIAGNOSIS — M25561 Pain in right knee: Secondary | ICD-10-CM | POA: Diagnosis not present

## 2021-09-28 DIAGNOSIS — M6281 Muscle weakness (generalized): Secondary | ICD-10-CM | POA: Diagnosis not present

## 2021-09-28 DIAGNOSIS — R488 Other symbolic dysfunctions: Secondary | ICD-10-CM | POA: Diagnosis not present

## 2021-09-28 DIAGNOSIS — R279 Unspecified lack of coordination: Secondary | ICD-10-CM | POA: Diagnosis not present

## 2021-09-28 DIAGNOSIS — R296 Repeated falls: Secondary | ICD-10-CM | POA: Diagnosis not present

## 2021-09-28 NOTE — Telephone Encounter (Signed)
446 pm. Request received from [facility], NP to make a follow up call with patient and schedule a home visit if needed.   Phone call made to patient but no answer.  Message left requesting a call back.

## 2021-09-29 DIAGNOSIS — R279 Unspecified lack of coordination: Secondary | ICD-10-CM | POA: Diagnosis not present

## 2021-09-29 DIAGNOSIS — R488 Other symbolic dysfunctions: Secondary | ICD-10-CM | POA: Diagnosis not present

## 2021-09-29 DIAGNOSIS — R296 Repeated falls: Secondary | ICD-10-CM | POA: Diagnosis not present

## 2021-09-30 ENCOUNTER — Telehealth: Payer: Self-pay

## 2021-09-30 DIAGNOSIS — M6281 Muscle weakness (generalized): Secondary | ICD-10-CM | POA: Diagnosis not present

## 2021-09-30 DIAGNOSIS — R2681 Unsteadiness on feet: Secondary | ICD-10-CM | POA: Diagnosis not present

## 2021-09-30 DIAGNOSIS — M25561 Pain in right knee: Secondary | ICD-10-CM | POA: Diagnosis not present

## 2021-09-30 DIAGNOSIS — R488 Other symbolic dysfunctions: Secondary | ICD-10-CM | POA: Diagnosis not present

## 2021-09-30 DIAGNOSIS — R279 Unspecified lack of coordination: Secondary | ICD-10-CM | POA: Diagnosis not present

## 2021-09-30 DIAGNOSIS — R296 Repeated falls: Secondary | ICD-10-CM | POA: Diagnosis not present

## 2021-09-30 NOTE — Chronic Care Management (AMB) (Addendum)
Chronic Care Management Pharmacy Assistant   Name: James BONNEAU, MD  MRN: [id number] DOB: 1931/06/25  Reason for Encounter: Diabetes Disease State   Recent office visits:  None since last CCM contact  Recent consult visits:  None since last CCM contact  Hospital visits:  None in previous 6 months  Medications: Outpatient Encounter Medications as of 09/30/2021  Medication Sig   Alpha-Lipoic Acid 600 MG CAPS Take 1 capsule (600 mg total) by mouth daily.   brimonidine (ALPHAGAN) 0.2 % ophthalmic solution    cephALEXin (KEFLEX) 250 MG capsule Take 1 capsule (250 mg total) by mouth 4 (four) times daily.   clopidogrel (PLAVIX) 75 MG tablet TAKE 1 TABLET DAILY   Coenzyme Q10 (COQ10) 150 MG CAPS Take 1 capsule by mouth daily.   COMBIGAN 0.2-0.5 % ophthalmic solution    docusate sodium (COLACE) 100 MG capsule Take 1 capsule (100 mg total) by mouth 2 (two) times daily as needed for mild constipation.   dorzolamide-timolol (COSOPT) 22.3-6.8 MG/ML ophthalmic solution    DULoxetine (CYMBALTA) 60 MG capsule Take 1 capsule (60 mg total) by mouth daily.   famotidine (PEPCID) 20 MG tablet Take 1 tablet (20 mg total) by mouth at bedtime.   FARXIGA 5 MG TABS tablet Take 5 mg by mouth daily.   Fluocinolone Acetonide 0.01 % OIL    gabapentin (NEURONTIN) 300 MG capsule Take 600 mg by mouth 3 (three) times daily. 600 mg BID is his minimum dose without having breakthrough symptoms. He is aware 900 mg/day is recommended per kidney function.   insulin aspart (NOVOLOG) 100 UNIT/ML injection CBG 70 - 120: 0 units  CBG 121 - 150: 1 unit  CBG 151 - 200: 2 units  CBG 201 - 250: 3 units  CBG 251 - 300: 5 units  CBG 301 - 350: 7 units  CBG 351 - 400: 9 units   insulin glargine (LANTUS) 100 UNIT/ML injection Inject 0.4 mLs (40 Units total) into the skin at bedtime.   Insulin Pen Needle (ULTICARE MICRO PEN NEEDLES) 31G X 8 MM MISC Use to inject medication daily   ipratropium (ATROVENT) 0.03 % nasal  spray    l-methylfolate-B6-B12 (METANX) 3-35-2 MG TABS tablet Take 1 tablet by mouth daily.   levothyroxine (SYNTHROID) 50 MCG tablet Take 1 tablet (50 mcg total) by mouth daily before breakfast. Per endo [name]   LUMIGAN 0.01 % SOLN Place 1 drop into the right eye at bedtime.   melatonin 5 MG TABS Take 5 mg by mouth at bedtime.   nitroGLYCERIN (NITROSTAT) 0.4 MG SL tablet DISSOLVE 1 TABLET UNDER THE TONGUE EVERY 5 MINUTES AS NEEDED FOR CHEST PAIN   NOVOLOG FLEXPEN 100 UNIT/ML FlexPen Inject into the skin.   ofloxacin (FLOXIN) 0.3 % OTIC solution    Omega-3 Fatty Acids (FISH OIL) 1000 MG CAPS Take 1 capsule (1,000 mg total) by mouth in the morning and at bedtime.   ondansetron (ZOFRAN) 4 MG tablet Take 1 tablet (4 mg total) by mouth every 8 (eight) hours as needed for nausea or vomiting.   OZEMPIC, 1 MG/DOSE, 4 MG/3ML SOPN Inject into the skin.   polyethylene glycol (MIRALAX / GLYCOLAX) 17 g packet Take 17 g by mouth daily as needed.   pramipexole (MIRAPEX) 1 MG tablet Take 1 tablet (1 mg total) by mouth daily. At 4pm   RABEprazole (ACIPHEX) 20 MG tablet Take 1 tablet (20 mg total) by mouth daily.   rosuvastatin (CRESTOR) 40 MG tablet Take  1 tablet (40 mg total) by mouth daily.   senna-docusate (SENOKOT-S) 8.6-50 MG tablet Take 1 tablet by mouth at bedtime.   sodium chloride (MURO 128) 5 % ophthalmic solution 1 drop as needed for eye irritation.   tamsulosin (FLOMAX) 0.4 MG CAPS capsule Take 1 capsule (0.4 mg total) by mouth daily after breakfast.   TRESIBA FLEXTOUCH 200 UNIT/ML FlexTouch Pen Inject into the skin.   vitamin C (ASCORBIC ACID) 500 MG tablet Take 1,000 mg by mouth daily as needed.   No facility-administered encounter medications on file as of 09/30/2021.     Recent Relevant Labs: Lab Results  Component Value Date/Time   HGBA1C 8.5 (H) 04/02/2021 10:43 AM   HGBA1C 8.0 (H) 11/03/2020 04:43 AM   HGBA1C 8.6 12/05/2019 12:00 AM   HGBA1C 7.9 08/21/2019 12:00 AM   MICROALBUR  4.8 (H) 06/13/2021 03:28 PM   MICROALBUR 2.0 (H) 04/21/2013 11:19 AM    Kidney Function Lab Results  Component Value Date/Time   CREATININE 1.40 06/13/2021 03:28 PM   CREATININE 1.29 (H) 04/03/2021 04:45 AM   GFR 45.05 (L) 06/13/2021 03:28 PM   GFRNONAA 54 (L) 04/03/2021 04:45 AM   GFRAA 54 (L) 04/21/2020 02:00 PM   Attempted contact with [name], MD 3 times on 09/29/21,10/05/21,10/07/21 Unsuccessful outreach. Will attempt contact next month.    Current antihyperglycemic regimen:  Ozempic 1 mg - Inject once weekly Tresiba - Inject 90 units at bedtime  Farxiga 5mg  Humalog (Insulin Lispro) Inject 4 units prior to lunchtime meal (taking 5-15 units pre-meal PRN if eating large meal)   Adherence Review: Is the patient currently on a STATIN medication? Yes Is the patient currently on ACE/ARB medication? No Does the patient have >5 day gap between last estimated fill dates? No  Care Gaps: Annual wellness visit in last year? Yes Most recent A1C reading: 8.5  04/02/21 Most Recent BP reading:145/78  72-P  07/22/21  Last eye exam / retinopathy screening:11/23/20 Last diabetic foot exam:2021  Counseled patient on importance of annual eye and foot exam.   Star Rating Drugs:  Medication:  Last Fill: Day Supply Rosuvastatin 40mg      08/05/21          90 Farxiga 5mg                 08/15/21          90 Tresiba                        09/11/21          23 Ozempic                      08/15/21          84 Novolog                       07/26/21            90 Lantus  PCP appointment on 10/14/21  [name], CPP notified  [name], [place] Assistant [phone]  I have reviewed the care management and care coordination activities outlined in this encounter and I am certifying that I agree with the content of this note. No further action required.  [name], PharmD Clinical Pharmacist [facility] at [facility] [phone]

## 2021-10-04 ENCOUNTER — Telehealth: Payer: Self-pay

## 2021-10-04 DIAGNOSIS — R296 Repeated falls: Secondary | ICD-10-CM | POA: Diagnosis not present

## 2021-10-04 DIAGNOSIS — I69398 Other sequelae of cerebral infarction: Secondary | ICD-10-CM | POA: Diagnosis not present

## 2021-10-04 DIAGNOSIS — R279 Unspecified lack of coordination: Secondary | ICD-10-CM | POA: Diagnosis not present

## 2021-10-04 DIAGNOSIS — R488 Other symbolic dysfunctions: Secondary | ICD-10-CM | POA: Diagnosis not present

## 2021-10-04 DIAGNOSIS — M6281 Muscle weakness (generalized): Secondary | ICD-10-CM | POA: Diagnosis not present

## 2021-10-04 NOTE — Telephone Encounter (Signed)
935 am.  Phone contact made with patient follow up on overall status and offer a home visit with Palliative Care NP.  Patient advised he is doing relatively well at this time and is without needs.  He prefers phone contact with PC every 2-3 months.  At patient's request, reviewed Palliative Care services.  Update provided to [facility], NP

## 2021-10-05 DIAGNOSIS — M25561 Pain in right knee: Secondary | ICD-10-CM | POA: Diagnosis not present

## 2021-10-05 DIAGNOSIS — R296 Repeated falls: Secondary | ICD-10-CM | POA: Diagnosis not present

## 2021-10-05 DIAGNOSIS — M6281 Muscle weakness (generalized): Secondary | ICD-10-CM | POA: Diagnosis not present

## 2021-10-05 DIAGNOSIS — R488 Other symbolic dysfunctions: Secondary | ICD-10-CM | POA: Diagnosis not present

## 2021-10-05 DIAGNOSIS — R2681 Unsteadiness on feet: Secondary | ICD-10-CM | POA: Diagnosis not present

## 2021-10-05 DIAGNOSIS — R279 Unspecified lack of coordination: Secondary | ICD-10-CM | POA: Diagnosis not present

## 2021-10-06 DIAGNOSIS — I69398 Other sequelae of cerebral infarction: Secondary | ICD-10-CM | POA: Diagnosis not present

## 2021-10-06 DIAGNOSIS — M6281 Muscle weakness (generalized): Secondary | ICD-10-CM | POA: Diagnosis not present

## 2021-10-06 DIAGNOSIS — R296 Repeated falls: Secondary | ICD-10-CM | POA: Diagnosis not present

## 2021-10-06 DIAGNOSIS — R488 Other symbolic dysfunctions: Secondary | ICD-10-CM | POA: Diagnosis not present

## 2021-10-06 DIAGNOSIS — R279 Unspecified lack of coordination: Secondary | ICD-10-CM | POA: Diagnosis not present

## 2021-10-07 DIAGNOSIS — M6281 Muscle weakness (generalized): Secondary | ICD-10-CM | POA: Diagnosis not present

## 2021-10-07 DIAGNOSIS — R279 Unspecified lack of coordination: Secondary | ICD-10-CM | POA: Diagnosis not present

## 2021-10-07 DIAGNOSIS — I69398 Other sequelae of cerebral infarction: Secondary | ICD-10-CM | POA: Diagnosis not present

## 2021-10-07 DIAGNOSIS — R488 Other symbolic dysfunctions: Secondary | ICD-10-CM | POA: Diagnosis not present

## 2021-10-07 DIAGNOSIS — R2681 Unsteadiness on feet: Secondary | ICD-10-CM | POA: Diagnosis not present

## 2021-10-07 DIAGNOSIS — R296 Repeated falls: Secondary | ICD-10-CM | POA: Diagnosis not present

## 2021-10-07 DIAGNOSIS — M25561 Pain in right knee: Secondary | ICD-10-CM | POA: Diagnosis not present

## 2021-10-10 DIAGNOSIS — M6281 Muscle weakness (generalized): Secondary | ICD-10-CM | POA: Diagnosis not present

## 2021-10-10 DIAGNOSIS — R279 Unspecified lack of coordination: Secondary | ICD-10-CM | POA: Diagnosis not present

## 2021-10-10 DIAGNOSIS — R296 Repeated falls: Secondary | ICD-10-CM | POA: Diagnosis not present

## 2021-10-10 DIAGNOSIS — I69398 Other sequelae of cerebral infarction: Secondary | ICD-10-CM | POA: Diagnosis not present

## 2021-10-10 DIAGNOSIS — R488 Other symbolic dysfunctions: Secondary | ICD-10-CM | POA: Diagnosis not present

## 2021-10-11 DIAGNOSIS — H532 Diplopia: Secondary | ICD-10-CM | POA: Diagnosis not present

## 2021-10-12 ENCOUNTER — Other Ambulatory Visit: Payer: Self-pay | Admitting: Family Medicine

## 2021-10-12 DIAGNOSIS — I69398 Other sequelae of cerebral infarction: Secondary | ICD-10-CM | POA: Diagnosis not present

## 2021-10-12 DIAGNOSIS — M6281 Muscle weakness (generalized): Secondary | ICD-10-CM | POA: Diagnosis not present

## 2021-10-13 DIAGNOSIS — R488 Other symbolic dysfunctions: Secondary | ICD-10-CM | POA: Diagnosis not present

## 2021-10-13 DIAGNOSIS — R279 Unspecified lack of coordination: Secondary | ICD-10-CM | POA: Diagnosis not present

## 2021-10-13 DIAGNOSIS — R296 Repeated falls: Secondary | ICD-10-CM | POA: Diagnosis not present

## 2021-10-13 NOTE — Telephone Encounter (Signed)
Last office visit 06/13/2021 for [facility].  Last refilled:  Not on current medication list with note on refill request-The original prescription was discontinued on 04/01/2021 by Arby Barrette, CPhT for the following reason: Discontinued by provider. Renewing this prescription may not be appropriate.    Patient is scheduled to see Dr. Darnell Level on Friday 10/14/2021 for 4 month follow up.

## 2021-10-14 ENCOUNTER — Ambulatory Visit: Payer: Medicare Other | Admitting: Family Medicine

## 2021-10-14 DIAGNOSIS — R488 Other symbolic dysfunctions: Secondary | ICD-10-CM | POA: Diagnosis not present

## 2021-10-14 DIAGNOSIS — R296 Repeated falls: Secondary | ICD-10-CM | POA: Diagnosis not present

## 2021-10-14 DIAGNOSIS — R2681 Unsteadiness on feet: Secondary | ICD-10-CM | POA: Diagnosis not present

## 2021-10-14 DIAGNOSIS — M6281 Muscle weakness (generalized): Secondary | ICD-10-CM | POA: Diagnosis not present

## 2021-10-14 DIAGNOSIS — R279 Unspecified lack of coordination: Secondary | ICD-10-CM | POA: Diagnosis not present

## 2021-10-14 DIAGNOSIS — I69398 Other sequelae of cerebral infarction: Secondary | ICD-10-CM | POA: Diagnosis not present

## 2021-10-14 DIAGNOSIS — M25561 Pain in right knee: Secondary | ICD-10-CM | POA: Diagnosis not present

## 2021-10-17 ENCOUNTER — Other Ambulatory Visit: Payer: Self-pay

## 2021-10-17 ENCOUNTER — Encounter: Payer: Self-pay | Admitting: Adult Health

## 2021-10-17 ENCOUNTER — Other Ambulatory Visit (INDEPENDENT_AMBULATORY_CARE_PROVIDER_SITE_OTHER): Payer: Medicare Other

## 2021-10-17 ENCOUNTER — Telehealth: Payer: Self-pay

## 2021-10-17 ENCOUNTER — Other Ambulatory Visit: Payer: Self-pay | Admitting: Adult Health

## 2021-10-17 ENCOUNTER — Encounter: Payer: Medicare Other | Admitting: Adult Health

## 2021-10-17 DIAGNOSIS — I69398 Other sequelae of cerebral infarction: Secondary | ICD-10-CM | POA: Diagnosis not present

## 2021-10-17 DIAGNOSIS — R300 Dysuria: Secondary | ICD-10-CM

## 2021-10-17 DIAGNOSIS — M6281 Muscle weakness (generalized): Secondary | ICD-10-CM | POA: Diagnosis not present

## 2021-10-17 DIAGNOSIS — G4733 Obstructive sleep apnea (adult) (pediatric): Secondary | ICD-10-CM

## 2021-10-17 MED ORDER — CEPHALEXIN 250 MG PO CAPS: 250.0000 mg | ORAL_CAPSULE | Freq: Four times a day (QID) | ORAL | 0 refills | Status: DC

## 2021-10-17 NOTE — Telephone Encounter (Signed)
2nd possible UTI in 2 months, last UTI was 09/07/2021 UCx at that time grew Klebsiella resistant to nitrofurantoin.  Plz notify - Rx sent for keflex 250mg  QID x 7 days.  Prior to starting antibiotic, I want him to go to [facility] for urinalysis with reflex to culture if indicated. I have ordered. Please schedule lab visit.  If ongoing symptoms, he will need to be seen.    Pt also missed appointment on Friday of last week. Please have him reschedule 32mo f/u OV for when we're back at [facility].

## 2021-10-17 NOTE — Telephone Encounter (Signed)
[facility] Night - Client TELEPHONE ADVICE RECORD [facility] Patient Name: James Bell Gender: Male DOB: [birth year] Age: 88 Y 3 M 16 D Return Phone Number: [phone] (Primary) Address: City/ State/ Zip: [name]  [address] Client [place] Night - Client Client Site [place] Provider [name] - MD Contact Type Call Who Is Calling Patient / Member / Family / Caregiver Call Type Triage / Clinical Relationship To Patient Self Return Phone Number [phone] (Primary) Chief Complaint Urination Pain Reason for Call Symptomatic / Request for [place] states he is having issues with his urinary tract and is requesting an antibiotic. His symptoms are urinary pain and urinary frequency. Translation No Disp. Time Eilene Ghazi Time) Disposition Final User 10/17/2021 8:25:15 AM Attempt made - message left [place], RN, Nira Conn 10/17/2021 8:43:25 AM FINAL ATTEMPT MADE - no message left Standifer, RN, Nira Conn 10/17/2021 8:44:08 AM Send to RN Final Attempt Lissa Hoard, RN, Nira Conn 10/17/2021 10:07:25 AM Attempt made - no message left Standifer, RN, Nira Conn 10/17/2021 10:17:57 AM FINAL ATTEMPT MADE - no message left Yes Standifer, RN, [place]

## 2021-10-17 NOTE — Telephone Encounter (Signed)
Patient notified as instructed by telephone and verbalized understanding. Patient stated that he was not aware that he had an appointment Friday. Patient scheduled for a follow-up appointment with Dr. Danise Mina 11/10/21 at 11:00. Patient stated that he did not request the refill on Celebrex because he has not been taking it.

## 2021-10-17 NOTE — Telephone Encounter (Signed)
Patient notified as instructed by telephone and verbalized understanding. Patient came to the [place] office and gave a urine specimen. Patient scheduled for a follow-up appointment with Dr. Danise Mina 11/10/21 at 11:00.

## 2021-10-17 NOTE — Telephone Encounter (Signed)
Pt missed Friday appointment.  Looks like celebrex was stopped 03/2021.  He should minimize NSAIDs even COX2s as he's on plavix.  Why is he needing this refilled?

## 2021-10-17 NOTE — Telephone Encounter (Signed)
I spoke with pt; pt said starting on 10/14/21 pt began with urinary pain and frequency and has worsened over weekend. Pt said has dull constant urinary pain that worsens upon urination. Pt also has frequency of urine. Pt said he does not have any abd pain and no fever. Pt denies any blood in urine and no back pain. Pt said he would not be able to do appt today. Pt request note sent to Dr Danise Mina for med to be sent to [facility] [address].  Pt request cb after reviewed  by Dr Danise Mina. Will send note to Dr Danise Mina, and Lattie Haw CMA. Will also teams [facility] [place].

## 2021-10-17 NOTE — Progress Notes (Signed)
PATIENT: James Spurr, MD DOB: 03-13-1931  REASON FOR VISIT: follow up HISTORY FROM: patient  HISTORY OF PRESENT ILLNESS: Today 10/18/21: James Bell is an 88 year old male with a history of obstructive sleep apnea on CPAP.  He reports that the CPAP is working well for him.  He is more concerned about his restless legs.  He states that he has been seeing Dr. Jeraldine Loots at [facility] and he was originally placed on oxycodone.  He cannot tolerate this medication as it caused altered mental status.  He is currently taking gabapentin 600 mg 4 times a day in addition to Mirapex 1 mg at bedtime.  The patient has been discussing with his PCP.  His PCP would like to decrease his dose of gabapentin due to kidney function.  The patient is asking for our input as well as he does not want to continue traveling to do.  He would like [name] to take over care of his diagnosis of restless legs.  He states that he has the most trouble at bedtime.  However during the day when his gabapentin dose is due he begins to have symptoms.  He returns today for an evaluation.  Compliance Report Usage 10/15/2020 - 11/13/2020 Usage days 26/30 days (87%) >= 4 hours 20 days (67%) Average usage (days used) 5 hours 43 minutes   AirSense 10 CPAP Serial number [id number] Mode CPAP Set pressure 9 cmH2O EPR Fulltime EPR level 3  Therapy Leaks - L/min 95th percentile: 16.9 Events per hour AHI: 3.7  11/11/19: James Bell is an 88 year old male with a history of obstructive sleep apnea on CPAP.  His download indicates that he uses machine 27 out of 30 days for compliance of 90%.  He uses machine greater than 4 hours 19 days for compliance of 63%.  On average he uses his machine 5 hours and 31 minutes.  His residual AHI is 1.7 on 9 cm of water with EPR 3.  He does not have a significant leak.  Reports that the machine is working well for him.  He denies any new issues.  Returns today for follow-up  HISTORY 11/04/2018: I reviewed  his CPAP compliance data from 09/30/2018 through 10/29/2018, which is a total of 30 days, during which time he used his CPAP every night with percent used days greater than 4 hours at 83%, indicating very good compliance with an average usage of 6 hours and 4 minutes, residual AHI at goal at 1.4 per hour, leak acceptable with the 95th percentile at 16.4 L/m on a pressure of 9 cm with EPR of 3. He reports doing well with CPAP therapy. He is up-to-date with his supplies typically, using nasal pillows. He is reducing Lyrica, which was for RLS, feels like he is having SEs. Has not had Botox in some time, he states. Has had some falls when R leg gave out. Feels like he may need rehab again. Using 4 point cane consistently at home.   REVIEW OF SYSTEMS: Out of a complete 14 system review of symptoms, the patient complains only of the following symptoms, and all other reviewed systems are negative.  ESS 8 FSS 21   ALLERGIES: Allergies  Allergen Reactions   Crestor [Rosuvastatin]     Other reaction(s): muscle pain 2.16.2022 Pt is current on this medication without any issues.   Dulaglutide     Other reaction(s): upset stomach   Empagliflozin     Other reaction(s): yeast infection   Gabapentin Other (See Comments)  At high doses - BLURRED VISION, BALLANCE, CONCENTRATION PROBLEMS, RESTLESSNESS, NOT SLEEPING, ? LOST OF STRENGTH, UNSTEADINESS   Lisinopril Cough    tachycardia   Nexium [Esomeprazole] Other (See Comments)    Headache   Niacin And Related Other (See Comments)    flushing   Oxycodone Other (See Comments)    Even 1/2 tablet of 5mg  caused increased confusion - contributory to hospitalization 10/2020 with AMS   Pregabalin     Other reaction(s): MALAISE, SLIGHT NAUSEA, SLIGHT HEADACHE    HOME MEDICATIONS: Outpatient Medications Prior to Visit  Medication Sig Dispense Refill   Alpha-Lipoic Acid 600 MG CAPS Take 1 capsule (600 mg total) by mouth daily. 30 capsule 11    brimonidine (ALPHAGAN) 0.2 % ophthalmic solution      cephALEXin (KEFLEX) 250 MG capsule Take 1 capsule (250 mg total) by mouth 4 (four) times daily. 28 capsule 0   clopidogrel (PLAVIX) 75 MG tablet TAKE 1 TABLET DAILY 90 tablet 3   Coenzyme Q10 (COQ10) 150 MG CAPS Take 1 capsule by mouth daily.     COMBIGAN 0.2-0.5 % ophthalmic solution      docusate sodium (COLACE) 100 MG capsule Take 1 capsule (100 mg total) by mouth 2 (two) times daily as needed for mild constipation. 10 capsule 0   dorzolamide-timolol (COSOPT) 22.3-6.8 MG/ML ophthalmic solution      DULoxetine (CYMBALTA) 60 MG capsule Take 1 capsule (60 mg total) by mouth daily. 90 capsule 3   famotidine (PEPCID) 20 MG tablet Take 1 tablet (20 mg total) by mouth at bedtime.     FARXIGA 5 MG TABS tablet Take 5 mg by mouth daily.     Fluocinolone Acetonide 0.01 % OIL      gabapentin (NEURONTIN) 300 MG capsule Take 600 mg by mouth 3 (three) times daily. 600 mg BID is his minimum dose without having breakthrough symptoms. He is aware 900 mg/day is recommended per kidney function.     insulin aspart (NOVOLOG) 100 UNIT/ML injection CBG 70 - 120: 0 units  CBG 121 - 150: 1 unit  CBG 151 - 200: 2 units  CBG 201 - 250: 3 units  CBG 251 - 300: 5 units  CBG 301 - 350: 7 units  CBG 351 - 400: 9 units 10 mL 11   insulin glargine (LANTUS) 100 UNIT/ML injection Inject 0.4 mLs (40 Units total) into the skin at bedtime. 10 mL 11   Insulin Pen Needle (ULTICARE MICRO PEN NEEDLES) 31G X 8 MM MISC Use to inject medication daily 100 each 3   ipratropium (ATROVENT) 0.03 % nasal spray      l-methylfolate-B6-B12 (METANX) 3-35-2 MG TABS tablet Take 1 tablet by mouth daily. 30 tablet 0   levothyroxine (SYNTHROID) 50 MCG tablet Take 1 tablet (50 mcg total) by mouth daily before breakfast. Per endo [name]     LUMIGAN 0.01 % SOLN Place 1 drop into the right eye at bedtime.     melatonin 5 MG TABS Take 5 mg by mouth at bedtime.     nitroGLYCERIN  (NITROSTAT) 0.4 MG SL tablet DISSOLVE 1 TABLET UNDER THE TONGUE EVERY 5 MINUTES AS NEEDED FOR CHEST PAIN 25 tablet 1   NOVOLOG FLEXPEN 100 UNIT/ML FlexPen Inject into the skin.     ofloxacin (FLOXIN) 0.3 % OTIC solution      Omega-3 Fatty Acids (FISH OIL) 1000 MG CAPS Take 1 capsule (1,000 mg total) by mouth in the morning and at bedtime.  0  ondansetron (ZOFRAN) 4 MG tablet Take 1 tablet (4 mg total) by mouth every 8 (eight) hours as needed for nausea or vomiting. 30 tablet 0   OZEMPIC, 1 MG/DOSE, 4 MG/3ML SOPN Inject into the skin.     polyethylene glycol (MIRALAX / GLYCOLAX) 17 g packet Take 17 g by mouth daily as needed. 14 each 0   pramipexole (MIRAPEX) 1 MG tablet Take 1 tablet (1 mg total) by mouth daily. At 4pm 90 tablet 3   RABEprazole (ACIPHEX) 20 MG tablet Take 1 tablet (20 mg total) by mouth daily. 90 tablet 3   rosuvastatin (CRESTOR) 40 MG tablet Take 1 tablet (40 mg total) by mouth daily. 90 tablet 3   senna-docusate (SENOKOT-S) 8.6-50 MG tablet Take 1 tablet by mouth at bedtime. 30 tablet 0   sodium chloride (MURO 128) 5 % ophthalmic solution 1 drop as needed for eye irritation.     tamsulosin (FLOMAX) 0.4 MG CAPS capsule Take 1 capsule (0.4 mg total) by mouth daily after breakfast. 90 capsule 3   TRESIBA FLEXTOUCH 200 UNIT/ML FlexTouch Pen Inject into the skin.     vitamin C (ASCORBIC ACID) 500 MG tablet Take 1,000 mg by mouth daily as needed.     cephALEXin (KEFLEX) 250 MG capsule Take 1 capsule (250 mg total) by mouth 4 (four) times daily. 28 capsule 0   No facility-administered medications prior to visit.    PAST MEDICAL HISTORY: Past Medical History:  Diagnosis Date   Anemia    Basal cell carcinoma 2018   R temple    Basal cell carcinoma of nose 2002   bridge of nose   BPH with obstruction/lower urinary tract symptoms    failed flomax, uroxatral, myrbetriq - sees urology [name]) pending videourodynamics   CKD (chronic kidney disease) stage  3, GFR 30-59 ml/min ([place]) 03/11/2012   Renal US 02/2014 - multiple kidney cysts    CVA (cerebral infarction) 03/07/12   slurred speech; right hemplegia, left handed - completed PT 07/2014 (17 sessions)   Depression    mild, on cymbalta per prior PCP   Erectile dysfunction    per urology (prostaglandin injection)   GERD (gastroesophageal reflux disease)    with sliding HH   H/O hiatal hernia    History of kidney problems 12/2010   lacerated left kidney after fall   HLD (hyperlipidemia)    Hx of basal cell carcinoma 07/10/2017   R. lat. forehead near hair line   Kidney cysts 02/2014   bilateral (renal US)   Migraines 03/11/12   now rare   OSA on CPAP    sleep study 01/2014 - severe, CPAP at 10, previously on nuvigil (Dr. [name])   Palpitations 01/2012   holter WNL, rare PAC/PVCs   Presbycusis of both ears 2016   rec amplification [facility])   Restless leg syndrome    well controlled on mirapex   Rosacea    James Bell   Stroke [facility])    TIA (transient ischemic attack)    Type 2 diabetes, controlled, with neuropathy ([place]) 2011   [name] in [place]: Past Surgical History:  Procedure Laterality Date   ABI  2007   WNL   CARDIAC CATHETERIZATION  2004   normal; [place]   CARDIOVASCULAR STRESS TEST  02/2014   WNL, low risk scan, EF 68%   CAROTID ENDARTERECTOMY     CATARACT EXTRACTION W/ INTRAOCULAR LENS IMPLANT  11//2012 - 08/2011   COLONOSCOPY  03/2011   poor prep, unable to biopsy polyp in tic fold, rpt 3 mo [name])   COLONOSCOPY  06/2011   mult polyps adenomatous, severe diverticulosis, rpt 3 yrs [name])   CYSTOSCOPY WITH URETHRAL DILATATION N/A 07/24/2013   Procedure: CYSTOSCOPY WITH URETHRAL DILATATION;  Surgeon: [name], MD;  Location: [facility];  Service: Urology;  Laterality: N/A;   ESOPHAGOGASTRODUODENOSCOPY  2006   duodenitis, medual HH, Grade 2 reflux, mild gastritis   ESOPHAGOGASTRODUODENOSCOPY  07/2011    mild chronic gastritis   HEMORRHOID SURGERY  1991   INGUINAL HERNIA REPAIR  1981; 2002   left, then repaired   MRI brain  04/2010   WNL   PROSTATE SURGERY  02/2009   PVP (laser)   SKIN CANCER EXCISION  2002   bridge of nose, BCC   TONSILLECTOMY AND ADENOIDECTOMY  ~ [place] N/A 07/24/2013   Procedure: TRANSURETHRAL INCISION OF THE PROSTATE (TUIP);  Surgeon: [name], MD;  Location: [facility];  Service: Urology;  Laterality: N/A;   URETHRAL DILATION  2014   tannenbaum    FAMILY HISTORY: Family History  Problem Relation Age of Onset   Stroke Father    Lung cancer Father 15        smoker   Alcoholism Sister    Kidney disease Sister    Stroke Brother    Diabetes Brother 7   Lung cancer Brother    Coronary artery disease Neg Hx    Sleep apnea Neg Hx     SOCIAL HISTORY: Social History   Socioeconomic History   Marital status: Divorced    Spouse name: Not on file   Number of children: 2   Years of education: MD   Highest education level: Not on file  Occupational History   Occupation: retired    Fish farm manager: RETIRED  Tobacco Use   Smoking status: Never   Smokeless tobacco: Never   Tobacco comments:    "used to smoke pipe when I was youngerFinancial risk analyst Use: Never used  Substance and Sexual Activity   Alcohol use: Yes    Alcohol/week: 4.0 standard drinks    Types: 2 Glasses of wine, 2 Shots of liquor per week    Comment: occasional   Drug use: No   Sexual activity: Yes  Other Topics Concern   Not on file  Social History Narrative   Moved into [place] in [place] 12/2020   Caffeine: 1 cup coffee/day    Occupation: ophthalmologist, retired   Edu: MD   Activity: going to gym 3x/wk with trainer   Diet: some water, fruits/vegetables some, red meat a few times, fish 1x/wk   POA - [name] (Daughter)   Social Determinants of Health   Financial Resource  Strain: Low Risk    Difficulty of Paying Living Expenses: Not hard at all  Food Insecurity: Not on file  Transportation Needs: Not on file  Physical Activity: Not on file  Stress: Not on file  Social Connections: Not on file  Intimate Partner Violence: Not on file      PHYSICAL EXAM  Vitals:   10/17/21 1339  BP: (!) 148/79  Pulse: 70  Weight: 196 lb (88.9 kg)  Height: 5\' 8"  (1.727 m)   Body mass index is 29.8 kg/m.  Generalized: Well developed, in no acute distress  Chest: Lungs clear to auscultation bilaterally  Neurological examination  Mentation: Alert oriented to time, place,  history taking. Follows all commands speech and language fluent Cranial nerve II-XII: Extraocular movements were full, visual field were full on confrontational test Head turning and shoulder shrug  were normal and symmetric. Motor: The motor testing reveals 5 over 5 strength of all 4 extremities. Good symmetric motor tone is noted throughout.  Boot on the right foot due to a wound Sensory: Sensory testing is intact to soft touch on all 4 extremities. No evidence of extinction is noted.  Gait and station: Gait is normal.    DIAGNOSTIC DATA (LABS, IMAGING, TESTING) - I reviewed patient records, labs, notes, testing and imaging myself where available.  Lab Results  Component Value Date   WBC 8.8 06/13/2021   HGB 14.3 06/13/2021   HCT 41.9 06/13/2021   MCV 88.7 06/13/2021   PLT 172.0 06/13/2021      Component Value Date/Time   NA 135 06/13/2021 1528   NA 139 12/05/2019 0000   K 4.3 06/13/2021 1528   CL 99 06/13/2021 1528   CO2 28 06/13/2021 1528   GLUCOSE 163 (H) 06/13/2021 1528   BUN 27 (H) 06/13/2021 1528   CREATININE 1.40 06/13/2021 1528   CALCIUM 9.3 06/13/2021 1528   PROT 7.7 06/13/2021 1528   ALBUMIN 4.1 06/13/2021 1528   AST 18 06/13/2021 1528   ALT 13 06/13/2021 1528   ALKPHOS 41 06/13/2021 1528   BILITOT 0.7 06/13/2021 1528   GFRNONAA 54 (L) 04/03/2021 0445   GFRAA 54  (L) 04/21/2020 1400   Lab Results  Component Value Date   CHOL 105 06/13/2021   HDL 18.50 (L) 06/13/2021   LDLCALC 53 12/05/2019   LDLDIRECT 57.0 06/13/2021   TRIG 209.0 (H) 06/13/2021   CHOLHDL 6 06/13/2021   Lab Results  Component Value Date   HGBA1C 8.5 (H) 04/02/2021   Lab Results  Component Value Date   VITAMINB12 >1500 (H) 07/18/2017   Lab Results  Component Value Date   TSH 2.763 11/03/2020      ASSESSMENT AND PLAN 88 y.o. year old male  has a past medical history of Anemia, Basal cell carcinoma (2018), Basal cell carcinoma of nose (2002), BPH with obstruction/lower urinary tract symptoms, CKD (chronic kidney disease) stage 3, GFR 30-59 ml/min ([facility]) (03/11/2012), CVA (cerebral infarction) (03/07/12), Depression, Erectile dysfunction, GERD (gastroesophageal reflux disease), H/O hiatal hernia, History of kidney problems (12/2010), HLD (hyperlipidemia), basal cell carcinoma (07/10/2017), Kidney cysts (02/2014), Migraines (03/11/12), OSA on CPAP, Palpitations (01/2012), Presbycusis of both ears (2016), Restless leg syndrome, Rosacea, Stroke ([place]), TIA (transient ischemic attack), and Type 2 diabetes, controlled, with neuropathy ([place]) (2011). here with:  1.  Obstructive sleep apnea on CPAP  -Suboptimal compliance  -Encouraged to use the machine the entire time he is sleeping -Advised if his symptoms worsen or he develops new symptoms he should let us know.    I spent 30 minutes of face-to-face and non-face-to-face time with patient.  This included previsit chart review, lab review, study review, order entry, electronic health record documentation, patient education.    Ward Givens, MSN, NP-C 10/18/2021, 8:35 AM [facility] [address] [place], [place] [address] [phone]       This encounter was created in error - please disregard.

## 2021-10-18 DIAGNOSIS — M6281 Muscle weakness (generalized): Secondary | ICD-10-CM | POA: Diagnosis not present

## 2021-10-18 DIAGNOSIS — I69398 Other sequelae of cerebral infarction: Secondary | ICD-10-CM | POA: Diagnosis not present

## 2021-10-18 LAB — URINALYSIS WITH CULTURE, IF INDICATED
Bilirubin Urine: NEGATIVE
Ketones, ur: NEGATIVE
Nitrite: NEGATIVE
Specific Gravity, Urine: 1.02 (ref 1.000–1.030)
Total Protein, Urine: 30 — AB
Urine Glucose: >=1000 — AB
Urobilinogen, UA: 0.2 (ref 0.0–1.0)
pH: 5.5 (ref 5.0–8.0)

## 2021-10-19 ENCOUNTER — Other Ambulatory Visit: Payer: Self-pay | Admitting: Family Medicine

## 2021-10-19 ENCOUNTER — Other Ambulatory Visit: Payer: Medicare Other

## 2021-10-19 DIAGNOSIS — L089 Local infection of the skin and subcutaneous tissue, unspecified: Secondary | ICD-10-CM | POA: Diagnosis not present

## 2021-10-19 DIAGNOSIS — R300 Dysuria: Secondary | ICD-10-CM | POA: Diagnosis not present

## 2021-10-19 DIAGNOSIS — R238 Other skin changes: Secondary | ICD-10-CM | POA: Diagnosis not present

## 2021-10-20 DIAGNOSIS — R279 Unspecified lack of coordination: Secondary | ICD-10-CM | POA: Diagnosis not present

## 2021-10-20 DIAGNOSIS — M6281 Muscle weakness (generalized): Secondary | ICD-10-CM | POA: Diagnosis not present

## 2021-10-20 DIAGNOSIS — I69398 Other sequelae of cerebral infarction: Secondary | ICD-10-CM | POA: Diagnosis not present

## 2021-10-20 LAB — URINE CULTURE
MICRO NUMBER:: 12949032
Result:: NO GROWTH
SPECIMEN QUALITY:: ADEQUATE

## 2021-10-21 ENCOUNTER — Encounter: Payer: Medicare Other | Admitting: Physical Medicine & Rehabilitation

## 2021-10-21 DIAGNOSIS — L97521 Non-pressure chronic ulcer of other part of left foot limited to breakdown of skin: Secondary | ICD-10-CM | POA: Diagnosis not present

## 2021-10-21 DIAGNOSIS — S90821A Blister (nonthermal), right foot, initial encounter: Secondary | ICD-10-CM | POA: Diagnosis not present

## 2021-10-21 DIAGNOSIS — S90822A Blister (nonthermal), left foot, initial encounter: Secondary | ICD-10-CM | POA: Diagnosis not present

## 2021-10-21 DIAGNOSIS — E1142 Type 2 diabetes mellitus with diabetic polyneuropathy: Secondary | ICD-10-CM | POA: Diagnosis not present

## 2021-10-21 DIAGNOSIS — G8190 Hemiplegia, unspecified affecting unspecified side: Secondary | ICD-10-CM | POA: Diagnosis not present

## 2021-10-24 DIAGNOSIS — R279 Unspecified lack of coordination: Secondary | ICD-10-CM | POA: Diagnosis not present

## 2021-10-24 DIAGNOSIS — I69398 Other sequelae of cerebral infarction: Secondary | ICD-10-CM | POA: Diagnosis not present

## 2021-10-24 DIAGNOSIS — M6281 Muscle weakness (generalized): Secondary | ICD-10-CM | POA: Diagnosis not present

## 2021-10-25 ENCOUNTER — Other Ambulatory Visit: Payer: Self-pay

## 2021-10-25 ENCOUNTER — Emergency Department: Payer: Medicare Other

## 2021-10-25 ENCOUNTER — Inpatient Hospital Stay
Admission: EM | Admit: 2021-10-25 | Discharge: 2021-11-02 | DRG: 372 | Disposition: A | Discharge status: Skilled Nursing Facility | Payer: Medicare Other | Source: Skilled Nursing Facility | Attending: Internal Medicine | Admitting: Internal Medicine

## 2021-10-25 ENCOUNTER — Encounter: Payer: Self-pay | Admitting: Emergency Medicine

## 2021-10-25 DIAGNOSIS — A0472 Enterocolitis due to Clostridium difficile, not specified as recurrent: Secondary | ICD-10-CM

## 2021-10-25 DIAGNOSIS — E11621 Type 2 diabetes mellitus with foot ulcer: Secondary | ICD-10-CM | POA: Diagnosis present

## 2021-10-25 DIAGNOSIS — G2581 Restless legs syndrome: Secondary | ICD-10-CM | POA: Diagnosis present

## 2021-10-25 DIAGNOSIS — I129 Hypertensive chronic kidney disease with stage 1 through stage 4 chronic kidney disease, or unspecified chronic kidney disease: Secondary | ICD-10-CM | POA: Diagnosis not present

## 2021-10-25 DIAGNOSIS — N1831 Chronic kidney disease, stage 3a: Secondary | ICD-10-CM | POA: Diagnosis present

## 2021-10-25 DIAGNOSIS — K219 Gastro-esophageal reflux disease without esophagitis: Secondary | ICD-10-CM | POA: Diagnosis not present

## 2021-10-25 DIAGNOSIS — R531 Weakness: Secondary | ICD-10-CM

## 2021-10-25 DIAGNOSIS — N390 Urinary tract infection, site not specified: Secondary | ICD-10-CM | POA: Diagnosis not present

## 2021-10-25 DIAGNOSIS — R739 Hyperglycemia, unspecified: Secondary | ICD-10-CM | POA: Diagnosis not present

## 2021-10-25 DIAGNOSIS — I69351 Hemiplegia and hemiparesis following cerebral infarction affecting right dominant side: Secondary | ICD-10-CM | POA: Diagnosis not present

## 2021-10-25 DIAGNOSIS — Z794 Long term (current) use of insulin: Secondary | ICD-10-CM | POA: Diagnosis not present

## 2021-10-25 DIAGNOSIS — Z9841 Cataract extraction status, right eye: Secondary | ICD-10-CM

## 2021-10-25 DIAGNOSIS — R55 Syncope and collapse: Secondary | ICD-10-CM

## 2021-10-25 DIAGNOSIS — R42 Dizziness and giddiness: Secondary | ICD-10-CM | POA: Diagnosis not present

## 2021-10-25 DIAGNOSIS — I6381 Other cerebral infarction due to occlusion or stenosis of small artery: Secondary | ICD-10-CM | POA: Diagnosis not present

## 2021-10-25 DIAGNOSIS — G4733 Obstructive sleep apnea (adult) (pediatric): Secondary | ICD-10-CM | POA: Diagnosis present

## 2021-10-25 DIAGNOSIS — Z833 Family history of diabetes mellitus: Secondary | ICD-10-CM

## 2021-10-25 DIAGNOSIS — Z961 Presence of intraocular lens: Secondary | ICD-10-CM | POA: Diagnosis present

## 2021-10-25 DIAGNOSIS — F32A Depression, unspecified: Secondary | ICD-10-CM | POA: Diagnosis present

## 2021-10-25 DIAGNOSIS — B9689 Other specified bacterial agents as the cause of diseases classified elsewhere: Secondary | ICD-10-CM | POA: Diagnosis not present

## 2021-10-25 DIAGNOSIS — I10 Essential (primary) hypertension: Secondary | ICD-10-CM | POA: Diagnosis not present

## 2021-10-25 DIAGNOSIS — M6281 Muscle weakness (generalized): Secondary | ICD-10-CM | POA: Diagnosis not present

## 2021-10-25 DIAGNOSIS — Z888 Allergy status to other drugs, medicaments and biological substances status: Secondary | ICD-10-CM

## 2021-10-25 DIAGNOSIS — N401 Enlarged prostate with lower urinary tract symptoms: Secondary | ICD-10-CM | POA: Diagnosis present

## 2021-10-25 DIAGNOSIS — S91302A Unspecified open wound, left foot, initial encounter: Secondary | ICD-10-CM | POA: Diagnosis not present

## 2021-10-25 DIAGNOSIS — L03116 Cellulitis of left lower limb: Secondary | ICD-10-CM | POA: Diagnosis not present

## 2021-10-25 DIAGNOSIS — Z79899 Other long term (current) drug therapy: Secondary | ICD-10-CM

## 2021-10-25 DIAGNOSIS — Z743 Need for continuous supervision: Secondary | ICD-10-CM | POA: Diagnosis not present

## 2021-10-25 DIAGNOSIS — W19XXXA Unspecified fall, initial encounter: Secondary | ICD-10-CM | POA: Diagnosis present

## 2021-10-25 DIAGNOSIS — R197 Diarrhea, unspecified: Secondary | ICD-10-CM | POA: Diagnosis not present

## 2021-10-25 DIAGNOSIS — E785 Hyperlipidemia, unspecified: Secondary | ICD-10-CM | POA: Diagnosis not present

## 2021-10-25 DIAGNOSIS — L97429 Non-pressure chronic ulcer of left heel and midfoot with unspecified severity: Secondary | ICD-10-CM | POA: Diagnosis not present

## 2021-10-25 DIAGNOSIS — Z87440 Personal history of urinary (tract) infections: Secondary | ICD-10-CM

## 2021-10-25 DIAGNOSIS — S90822A Blister (nonthermal), left foot, initial encounter: Secondary | ICD-10-CM | POA: Diagnosis not present

## 2021-10-25 DIAGNOSIS — E1165 Type 2 diabetes mellitus with hyperglycemia: Secondary | ICD-10-CM

## 2021-10-25 DIAGNOSIS — Z85828 Personal history of other malignant neoplasm of skin: Secondary | ICD-10-CM

## 2021-10-25 DIAGNOSIS — Z20822 Contact with and (suspected) exposure to covid-19: Secondary | ICD-10-CM | POA: Diagnosis present

## 2021-10-25 DIAGNOSIS — Z9842 Cataract extraction status, left eye: Secondary | ICD-10-CM

## 2021-10-25 DIAGNOSIS — Z885 Allergy status to narcotic agent status: Secondary | ICD-10-CM

## 2021-10-25 DIAGNOSIS — E861 Hypovolemia: Secondary | ICD-10-CM | POA: Diagnosis not present

## 2021-10-25 DIAGNOSIS — Z823 Family history of stroke: Secondary | ICD-10-CM

## 2021-10-25 DIAGNOSIS — Z79890 Hormone replacement therapy (postmenopausal): Secondary | ICD-10-CM

## 2021-10-25 DIAGNOSIS — Z7902 Long term (current) use of antithrombotics/antiplatelets: Secondary | ICD-10-CM

## 2021-10-25 DIAGNOSIS — S91309A Unspecified open wound, unspecified foot, initial encounter: Secondary | ICD-10-CM

## 2021-10-25 DIAGNOSIS — R2689 Other abnormalities of gait and mobility: Secondary | ICD-10-CM | POA: Diagnosis not present

## 2021-10-25 DIAGNOSIS — E1122 Type 2 diabetes mellitus with diabetic chronic kidney disease: Secondary | ICD-10-CM | POA: Diagnosis not present

## 2021-10-25 DIAGNOSIS — S90812A Abrasion, left foot, initial encounter: Secondary | ICD-10-CM

## 2021-10-25 DIAGNOSIS — L03119 Cellulitis of unspecified part of limb: Secondary | ICD-10-CM | POA: Diagnosis present

## 2021-10-25 DIAGNOSIS — L089 Local infection of the skin and subcutaneous tissue, unspecified: Secondary | ICD-10-CM

## 2021-10-25 DIAGNOSIS — R9431 Abnormal electrocardiogram [ECG] [EKG]: Secondary | ICD-10-CM | POA: Diagnosis not present

## 2021-10-25 DIAGNOSIS — Z8619 Personal history of other infectious and parasitic diseases: Secondary | ICD-10-CM

## 2021-10-25 LAB — CBC
HCT: 40.4 % (ref 39.0–52.0)
Hemoglobin: 13.3 g/dL (ref 13.0–17.0)
MCH: 30.3 pg (ref 26.0–34.0)
MCHC: 32.9 g/dL (ref 30.0–36.0)
MCV: 92 fL (ref 80.0–100.0)
Platelets: 193 10*3/uL (ref 150–400)
RBC: 4.39 MIL/uL (ref 4.22–5.81)
RDW: 13.6 % (ref 11.5–15.5)
WBC: 12.4 10*3/uL — ABNORMAL HIGH (ref 4.0–10.5)
nRBC: 0 % (ref 0.0–0.2)

## 2021-10-25 LAB — RESP PANEL BY RT-PCR (FLU A&B, COVID) ARPGX2
Influenza A by PCR: NEGATIVE
Influenza B by PCR: NEGATIVE
SARS Coronavirus 2 by RT PCR: NEGATIVE

## 2021-10-25 LAB — LACTIC ACID, PLASMA: Lactic Acid, Venous: 1.4 mmol/L (ref 0.5–1.9)

## 2021-10-25 LAB — BASIC METABOLIC PANEL
Anion gap: 7 (ref 5–15)
BUN: 20 mg/dL (ref 8–23)
CO2: 25 mmol/L (ref 22–32)
Calcium: 8.4 mg/dL — ABNORMAL LOW (ref 8.9–10.3)
Chloride: 101 mmol/L (ref 98–111)
Creatinine, Ser: 1.35 mg/dL — ABNORMAL HIGH (ref 0.61–1.24)
GFR, Estimated: 50 mL/min — ABNORMAL LOW (ref >60)
Glucose, Bld: 302 mg/dL — ABNORMAL HIGH (ref 70–99)
Potassium: 4.2 mmol/L (ref 3.5–5.1)
Sodium: 133 mmol/L — ABNORMAL LOW (ref 135–145)

## 2021-10-25 LAB — BRAIN NATRIURETIC PEPTIDE: B Natriuretic Peptide: 74.4 pg/mL (ref 0.0–100.0)

## 2021-10-25 MED ORDER — GABAPENTIN 300 MG PO CAPS
600.0000 mg | ORAL_CAPSULE | Freq: Once | ORAL | Status: AC
  Administered 2021-10-25: 600 mg via ORAL
  Filled 2021-10-25: qty 2

## 2021-10-25 MED ORDER — SODIUM CHLORIDE 0.9 % IV BOLUS
500.0000 mL | Freq: Once | INTRAVENOUS | Status: AC
  Administered 2021-10-25: 500 mL via INTRAVENOUS

## 2021-10-25 MED ORDER — SODIUM CHLORIDE 0.9 % IV SOLN
2.0000 g | INTRAVENOUS | Status: DC
  Administered 2021-10-26 – 2021-10-27 (×3): 2 g via INTRAVENOUS
  Filled 2021-10-25 (×4): qty 20

## 2021-10-25 MED ORDER — CLINDAMYCIN PHOSPHATE 600 MG/50ML IV SOLN
600.0000 mg | Freq: Once | INTRAVENOUS | Status: AC
  Administered 2021-10-25: 600 mg via INTRAVENOUS
  Filled 2021-10-25: qty 50

## 2021-10-25 MED ORDER — ENOXAPARIN SODIUM 40 MG/0.4ML IJ SOSY
40.0000 mg | PREFILLED_SYRINGE | INTRAMUSCULAR | Status: DC
  Administered 2021-10-26: 40 mg via SUBCUTANEOUS
  Filled 2021-10-25: qty 0.4

## 2021-10-25 MED ORDER — ONDANSETRON HCL 4 MG/2ML IJ SOLN
4.0000 mg | Freq: Four times a day (QID) | INTRAMUSCULAR | Status: DC | PRN
Start: 2021-10-25 — End: 2021-11-02

## 2021-10-25 MED ORDER — INSULIN ASPART 100 UNIT/ML IJ SOLN
0.0000 [IU] | Freq: Three times a day (TID) | INTRAMUSCULAR | Status: DC
  Administered 2021-10-26: 3 [IU] via SUBCUTANEOUS
  Administered 2021-10-26 – 2021-10-31 (×7): 2 [IU] via SUBCUTANEOUS
  Administered 2021-10-31 (×2): 3 [IU] via SUBCUTANEOUS
  Administered 2021-11-01 (×3): 2 [IU] via SUBCUTANEOUS
  Administered 2021-11-02: 5 [IU] via SUBCUTANEOUS
  Filled 2021-10-25 (×14): qty 1

## 2021-10-25 MED ORDER — VANCOMYCIN HCL 2000 MG/400ML IV SOLN
2000.0000 mg | Freq: Once | INTRAVENOUS | Status: AC
  Administered 2021-10-26: 2000 mg via INTRAVENOUS
  Filled 2021-10-25 (×2): qty 400

## 2021-10-25 MED ORDER — ACETAMINOPHEN 650 MG RE SUPP: 650.0000 mg | Freq: Four times a day (QID) | RECTAL | Status: DC | PRN

## 2021-10-25 MED ORDER — MELATONIN 5 MG PO TABS
5.0000 mg | ORAL_TABLET | Freq: Every day | ORAL | Status: DC
  Administered 2021-10-26 – 2021-11-01 (×8): 5 mg via ORAL
  Filled 2021-10-25 (×8): qty 1

## 2021-10-25 MED ORDER — INSULIN ASPART 100 UNIT/ML IJ SOLN: 0.0000 [IU] | Freq: Every day | INTRAMUSCULAR | Status: DC

## 2021-10-25 MED ORDER — ALPHA-LIPOIC ACID 600 MG PO CAPS: 1.0000 | ORAL_CAPSULE | Freq: Every day | ORAL | Status: DC

## 2021-10-25 MED ORDER — INSULIN GLARGINE-YFGN 100 UNIT/ML ~~LOC~~ SOLN
20.0000 [IU] | Freq: Every day | SUBCUTANEOUS | Status: DC
  Administered 2021-10-26 – 2021-11-01 (×8): 20 [IU] via SUBCUTANEOUS
  Filled 2021-10-25 (×9): qty 0.2

## 2021-10-25 MED ORDER — CLOPIDOGREL BISULFATE 75 MG PO TABS
75.0000 mg | ORAL_TABLET | Freq: Every day | ORAL | Status: DC
  Administered 2021-10-26 – 2021-11-02 (×8): 75 mg via ORAL
  Filled 2021-10-25 (×8): qty 1

## 2021-10-25 MED ORDER — PRAMIPEXOLE DIHYDROCHLORIDE 1 MG PO TABS
1.0000 mg | ORAL_TABLET | Freq: Every day | ORAL | Status: DC
  Administered 2021-10-26 – 2021-11-02 (×8): 1 mg via ORAL
  Filled 2021-10-25 (×8): qty 1

## 2021-10-25 MED ORDER — PANTOPRAZOLE SODIUM 40 MG PO TBEC
40.0000 mg | DELAYED_RELEASE_TABLET | Freq: Every day | ORAL | Status: DC
  Administered 2021-10-26 – 2021-11-02 (×8): 40 mg via ORAL
  Filled 2021-10-25 (×8): qty 1

## 2021-10-25 MED ORDER — GABAPENTIN 300 MG PO CAPS: 600.0000 mg | ORAL_CAPSULE | Freq: Three times a day (TID) | ORAL | Status: DC

## 2021-10-25 MED ORDER — ONDANSETRON HCL 4 MG PO TABS: 4.0000 mg | ORAL_TABLET | Freq: Four times a day (QID) | ORAL | Status: DC | PRN

## 2021-10-25 MED ORDER — TAMSULOSIN HCL 0.4 MG PO CAPS
0.4000 mg | ORAL_CAPSULE | Freq: Every day | ORAL | Status: DC
  Administered 2021-10-26 – 2021-11-02 (×8): 0.4 mg via ORAL
  Filled 2021-10-25 (×8): qty 1

## 2021-10-25 MED ORDER — ACETAMINOPHEN 325 MG PO TABS
650.0000 mg | ORAL_TABLET | Freq: Four times a day (QID) | ORAL | Status: DC | PRN
  Administered 2021-10-29: 650 mg via ORAL
  Filled 2021-10-25: qty 2

## 2021-10-25 NOTE — H&P (Signed)
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  Patient states he fell against the wall and was able to slide down to the floor.  He did not hit his head or sustain any injury.  He was concerned he might have a urinary tract infection as his doctor had treated him recently for a UTI.  Reports having diarrhea for the last few weeks, about 2 soft bowel movements daily.  His daughter who is at the bedside also contributes to the history he mentioned an area on his left heel which has been increasingly painful and red.  Patient has severe restless leg syndrome that is managed by neurology, and he has been rubbing his left heel uncontrollably on the bed causing a wound to develop.  He denies nausea, vomiting, cough, shortness of breath or chest pain.  ED course: Tmax 100.3 with otherwise normal vitals on arrival Blood work: WBC 12,000 Blood glucose 302.  Creatinine at baseline at 1.35  EKG, personally viewed and interpreted: NSR at 86 with no acute ST-T wave changes  Imaging: CT head with all left basal ganglia lacunar infarct Chest x-ray no acute process Left foot x-ray.  No acute bony abnormality  Patient started on clindamycin and given an IV fluid bolus.  Hospitalist consulted for admission.   Review of Systems: As mentioned in the history of present illness. All other systems reviewed and are negative. Past Medical History:  Diagnosis Date   Anemia    Basal cell  carcinoma 2018   R temple    Basal cell carcinoma of nose 2002   bridge of nose   BPH with obstruction/lower urinary tract symptoms    failed flomax, uroxatral, myrbetriq - sees urology [name]) pending videourodynamics   CKD (chronic kidney disease) stage 3, GFR 30-59 ml/min ([place]) 03/11/2012   Renal US 02/2014 - multiple kidney cysts    CVA (cerebral infarction) 03/07/12   slurred speech; right hemplegia, left handed - completed PT 07/2014 (17 sessions)   Depression    mild, on cymbalta per prior PCP   Erectile dysfunction    per urology (prostaglandin injection)   GERD (gastroesophageal reflux disease)    with sliding HH   H/O hiatal hernia    History of kidney problems 12/2010   lacerated left kidney after fall   HLD (hyperlipidemia)    Hx of basal cell carcinoma 07/10/2017   R. lat. forehead near hair line   Kidney cysts 02/2014   bilateral (renal US)   Migraines 03/11/12   now rare   OSA on CPAP    sleep study 01/2014 - severe, CPAP at 10, previously on nuvigil (Dr. [name])   Palpitations 01/2012   holter WNL, rare PAC/PVCs   Presbycusis of both ears 2016   rec amplification [facility])   Restless leg syndrome    well controlled on mirapex   Rosacea    [place]   Stroke [facility])    TIA (  transient ischemic attack)    Type 2 diabetes, controlled, with neuropathy ([place]) 2011   [name] in [facility]   Past Surgical History:  Procedure Laterality Date   ABI  2007   WNL   CARDIAC CATHETERIZATION  2004   normal; [place]   CARDIOVASCULAR STRESS TEST  02/2014   WNL, low risk scan, EF 68%   CAROTID ENDARTERECTOMY     CATARACT EXTRACTION W/ INTRAOCULAR LENS IMPLANT  11//2012 - 08/2011   COLONOSCOPY  03/2011   poor prep, unable to biopsy polyp in tic fold, rpt 3 mo [name])   COLONOSCOPY  06/2011   mult polyps adenomatous, severe diverticulosis, rpt 3 yrs [name])   CYSTOSCOPY WITH URETHRAL DILATATION N/A 07/24/2013   Procedure: CYSTOSCOPY WITH URETHRAL DILATATION;  Surgeon:  [name], MD;  Location: [facility];  Service: Urology;  Laterality: N/A;   ESOPHAGOGASTRODUODENOSCOPY  2006   duodenitis, medual HH, Grade 2 reflux, mild gastritis   ESOPHAGOGASTRODUODENOSCOPY  07/2011   mild chronic gastritis   HEMORRHOID SURGERY  1991   INGUINAL HERNIA REPAIR  1981; 2002   left, then repaired   MRI brain  04/2010   WNL   PROSTATE SURGERY  02/2009   PVP (laser)   SKIN CANCER EXCISION  2002   bridge of nose, [facility]   TONSILLECTOMY AND ADENOIDECTOMY  ~ [place] N/A 07/24/2013   Procedure: TRANSURETHRAL INCISION OF THE PROSTATE (TUIP);  Surgeon: [name], MD;  Location: [facility];  Service: Urology;  Laterality: N/A;   URETHRAL DILATION  2014   [place]   Social History:  reports that he has never smoked. He has never used smokeless tobacco. He reports current alcohol use of about 4.0 standard drinks per week. He reports that he does not use drugs.  Allergies  Allergen Reactions   Crestor [Rosuvastatin]     Other reaction(s): muscle pain 2.16.2022 Pt is current on this medication without any issues.   Lisinopril Cough    tachycardia   Nexium [Esomeprazole] Other (See Comments)    Headache   Oxycodone Other (See Comments)    Even 1/2 tablet of 5mg  caused increased confusion - contributory to hospitalization 10/2020 with AMS    Family History  Problem Relation Age of Onset   Stroke Father    Lung cancer Father 20        smoker   Alcoholism Sister    Kidney disease Sister    Stroke Brother    Diabetes Brother 70   Lung cancer Brother    Coronary artery disease Neg Hx    Sleep apnea Neg Hx     Prior to Admission medications   Medication Sig Start Date End Date Taking? Authorizing Provider  Alpha-Lipoic Acid 600 MG CAPS Take 1 capsule (600 mg total) by mouth daily. 06/28/21   [name], MD  brimonidine [facility]) 0.2 % ophthalmic solution  03/25/21   [provider]  cephALEXin (KEFLEX) 250 MG  capsule Take 1 capsule (250 mg total) by mouth 4 (four) times daily. 10/17/21   [name], MD  clopidogrel (PLAVIX) 75 MG tablet TAKE 1 TABLET DAILY 01/11/21   [name], MD  Coenzyme Q10 (COQ10) 150 MG CAPS Take 1 capsule by mouth daily.    [provider]  COMBIGAN 0.2-0.5 % ophthalmic solution  03/25/21   [provider]  docusate sodium (COLACE) 100 MG capsule Take 1 capsule (100 mg total) by mouth 2 (two) times daily as  needed for mild constipation. 04/05/21   Hosie Poisson, MD  dorzolamide-timolol (COSOPT) 22.3-6.8 MG/ML ophthalmic solution  12/17/20   [provider]  DULoxetine (CYMBALTA) 60 MG capsule Take 1 capsule (60 mg total) by mouth daily. 07/27/21   [name], MD  famotidine (PEPCID) 20 MG tablet Take 1 tablet (20 mg total) by mouth at bedtime. 04/05/21   Hosie Poisson, MD  FARXIGA 5 MG TABS tablet Take 5 mg by mouth daily. 06/28/21   [provider]  Fluocinolone Acetonide 0.01 % OIL  03/25/21   [provider]  gabapentin (NEURONTIN) 300 MG capsule Take 600 mg by mouth 3 (three) times daily. 600 mg BID is his minimum dose without having breakthrough symptoms. He is aware 900 mg/day is recommended per kidney function. 11/15/20   [name], MD  insulin aspart (NOVOLOG) 100 UNIT/ML injection CBG 70 - 120: 0 units  CBG 121 - 150: 1 unit  CBG 151 - 200: 2 units  CBG 201 - 250: 3 units  CBG 251 - 300: 5 units  CBG 301 - 350: 7 units  CBG 351 - 400: 9 units 04/05/21   Hosie Poisson, MD  insulin glargine (LANTUS) 100 UNIT/ML injection Inject 0.4 mLs (40 Units total) into the skin at bedtime. 04/05/21   Hosie Poisson, MD  Insulin Pen Needle (ULTICARE MICRO PEN NEEDLES) 31G X 8 MM MISC Use to inject medication daily 07/27/21   [name], MD  ipratropium (ATROVENT) 0.03 % nasal spray  03/25/21   [provider]  l-methylfolate-B6-B12 (METANX) 3-35-2 MG TABS tablet Take 1 tablet by mouth daily. 07/07/20   Swayze,  Ava, DO  levothyroxine (SYNTHROID) 50 MCG tablet Take 1 tablet (50 mcg total) by mouth daily before breakfast. Per endo [name] 12/07/19   [name], MD  LUMIGAN 0.01 % SOLN Place 1 drop into the right eye at bedtime. 06/01/20   [name], MD  melatonin 5 MG TABS Take 5 mg by mouth at bedtime.    [provider]  nitroGLYCERIN (NITROSTAT) 0.4 MG SL tablet DISSOLVE 1 TABLET UNDER THE TONGUE EVERY 5 MINUTES AS NEEDED FOR CHEST PAIN 07/28/16   [name], MD  NOVOLOG FLEXPEN 100 UNIT/ML FlexPen Inject into the skin. 06/27/21   [provider]  ofloxacin (FLOXIN) 0.3 % OTIC solution  03/25/21   [provider]  Omega-3 Fatty Acids (FISH OIL) 1000 MG CAPS Take 1 capsule (1,000 mg total) by mouth in the morning and at bedtime. 06/01/20   [name], MD  ondansetron (ZOFRAN) 4 MG tablet Take 1 tablet (4 mg total) by mouth every 8 (eight) hours as needed for nausea or vomiting. 11/10/20   [name], MD  OZEMPIC, 1 MG/DOSE, 4 MG/3ML SOPN Inject into the skin. 06/27/21   [provider]  polyethylene glycol (MIRALAX / GLYCOLAX) 17 g packet Take 17 g by mouth daily as needed. 04/05/21   Hosie Poisson, MD  pramipexole (MIRAPEX) 1 MG tablet Take 1 tablet (1 mg total) by mouth daily. At 4pm 07/27/21   [name], MD  RABEprazole (ACIPHEX) 20 MG tablet Take 1 tablet (20 mg total) by mouth daily. 07/27/21   [name], MD  rosuvastatin (CRESTOR) 40 MG tablet Take 1 tablet (40 mg total) by mouth daily. 07/27/21   [name], MD  senna-docusate (SENOKOT-S) 8.6-50 MG tablet Take 1 tablet by mouth at bedtime. 11/05/20   Florencia Reasons, MD  sodium chloride (MURO 128) 5 % ophthalmic solution 1 drop as  needed for eye irritation.    [provider]  tamsulosin (FLOMAX) 0.4 MG CAPS capsule Take 1 capsule (0.4 mg total) by mouth daily after breakfast. 07/27/21   [name], MD  TRESIBA FLEXTOUCH 200 UNIT/ML FlexTouch Pen Inject into  the skin. 07/06/21   [provider]  vitamin C (ASCORBIC ACID) 500 MG tablet Take 1,000 mg by mouth daily as needed.    [provider]    Physical Exam: Vitals:   10/25/21 2130 10/25/21 2245 10/25/21 2300 10/25/21 2330  BP: 138/63 (!) 130/53 124/64 126/61  Pulse: 86 89 88 85  Resp: (!) 22 18 19 16   Temp:    98.5 F (36.9 C)  TempSrc:    Oral  SpO2: 98% 100% 100% 99%  Weight:      Height:       Physical Exam Vitals and nursing note reviewed.  Constitutional:      General: He is not in acute distress.    Appearance: Normal appearance.  HENT:     Head: Normocephalic and atraumatic.  Cardiovascular:     Rate and Rhythm: Normal rate and regular rhythm.     Pulses: Normal pulses.     Heart sounds: Normal heart sounds. No murmur heard. Pulmonary:     Effort: Pulmonary effort is normal.     Breath sounds: Normal breath sounds. No wheezing or rhonchi.  Abdominal:     General: Bowel sounds are normal.     Palpations: Abdomen is soft.     Tenderness: There is no abdominal tenderness.  Musculoskeletal:        General: No swelling or tenderness. Normal range of motion.     Cervical back: Normal range of motion and neck supple.  Skin:    General: Skin is warm and dry.     Comments: Blister left heel. See pic below. Surrounding erythema and warmth  Neurological:     Mental Status: He is alert. Mental status is at baseline.     Comments: Right sided weakness  Psychiatric:        Mood and Affect: Mood normal.        Behavior: Behavior normal.      Data Reviewed: Notes from primary care and specialist visits, past discharge summaries. Prior diagnostic testing as applicable to current admission diagnoses Updated medications and problem lists for reconciliation ED course, including vitals, labs, imaging, treatment and response to treatment Triage notes and ED providers notes   Assessment and Plan: * Postural dizziness with presyncope Secondary to acute  infection Treat cellulitis/skin infection IV hydration Neurologic checks and fall precautions Follow-up urinalysis to rule out coexisting UTI  Cellulitis of left foot- (present on admission) Management as above  Abrasion of heel, infected, left, initial encounter Friction burn left heel secondary to restless leg syndrome with superimposed infection Rocephin and vancomycin Podiatry consult Wound care consult Treat restless legs Elevate heels  Uncontrolled type 2 diabetes mellitus with hyperglycemia, with long-term current use of insulin (HCC) Basal insulin with sliding scale coverage Follow A1c  Diarrhea Patient reports weeks of diarrhea in the setting of antibiotics for UTI Stool for C. difficile and GI panel ordered  Frequent UTI Chart review reveals Klebsiella UTI 09/09/2021, treated Follow-up UA/urine culture to  HTN (hypertension)- (present on admission) Continue home meds pending verification  Restless leg syndrome, severe- (present on admission) Resume neurontin and Mirapex which daughter states has been very effective Consider neurology input.  Has seen [facility] in the past  Stage  3a chronic kidney disease ([place]) Renal function at baseline Renally dose all meds  Hemiparesis affecting right side as late effect of cerebrovascular accident (CVA) ([place]) Continue antiplatelets and statin    Advance Care Planning:   Code Status: Full Code   Consults: podiatry  Family Communication: daughter at bedside  Severity of Illness: The appropriate patient status for this patient is INPATIENT. Inpatient status is judged to be reasonable and necessary in order to provide the required intensity of service to ensure the patient's safety. The patient's presenting symptoms, physical exam findings, and initial radiographic and laboratory data in the context of their chronic comorbidities is felt to place them at high risk for further clinical deterioration. Furthermore, it is  not anticipated that the patient will be medically stable for discharge from the hospital within 2 midnights of admission.   * I certify that at the point of admission it is my clinical judgment that the patient will require inpatient hospital care spanning beyond 2 midnights from the point of admission due to high intensity of service, high risk for further deterioration and high frequency of surveillance required.*  Author: Athena Masse, MD 10/26/2021 1:13 AM  For on call review [email or web address].

## 2021-10-25 NOTE — Assessment & Plan Note (Addendum)
Friction burn left heel secondary to restless leg syndrome with superimposed infection Rocephin and vancomycin Podiatry consult Wound care consult Treat restless legs Elevate heels

## 2021-10-25 NOTE — ED Notes (Signed)
Gave urine cup to patient does not need to urinate at this time.

## 2021-10-25 NOTE — ED Provider Notes (Signed)
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  Patient states that he believes the fall was primarily mechanical in nature.  However patient also states that he may have been weak causing the fall.  Patient did not hit his head during this fall but states that he came weak, fell into the wall and slid to the floor.  Patient also has an area of concern to the left heel.  Patient has a history of restless leg syndrome and states that he frequently rubs his legs and feet against objects.  He has rubbed an area to the posterior left foot/heel region which has essentially removed the epidermal layer of tissue.  Patient reports increasing pain to the area with extending erythema.  He also has noted that he has had some bilateral lower extremity edema though he has no history of congestive heart failure.  Patient is also concerned that he could have a UTI as he had had similar symptoms resulting in this type of fall in the past secondary to UTI.  He denied any dysuria, polyuria or hematuria at this time.  Patient stated he had no fever but did arrive with a temperature of 100.3 F.  On review of patient's medical records, he was admitted in July of last year on the 14th for similar symptoms.  Patient had sustained a fall after being weak, was noted to have a slightly elevated white blood cell count which resulted in the patient having a UTI.     Physical Exam   Triage Vital Signs: ED Triage Vitals  Enc Vitals Group     BP 10/25/21 1648 138/61     Pulse Rate 10/25/21 1648 86     Resp 10/25/21 1648 19     Temp 10/25/21 1648 100.3 F (37.9 C)     Temp Source 10/25/21 1648 Oral     SpO2 10/25/21 1648 91 %     Weight 10/25/21 1703 196 lb (88.9 kg)     Height 10/25/21 1703 5\' 8"  (1.727 m)     Head Circumference --       Peak Flow --      Pain Score 10/25/21 1703 7     Pain Loc --      Pain Edu? --      Excl. in [place]? --     Most recent vital signs: Vitals:   10/25/21 2130 10/25/21 2245  BP: 138/63 (!) 130/53  Pulse: 86 89  Resp: (!) 22 18  Temp:    SpO2: 98% 100%     General: Alert and in no acute distress. Eyes:  PERRL. EOMI. Head: No acute traumatic findings ENT:      Ears:       Nose: No congestion/rhinnorhea.      Mouth/Throat: Mucous membranes are moist.  Cardiovascular:  Good peripheral perfusion Respiratory: Normal respiratory effort without tachypnea or retractions. Lungs CTAB. Good air entry to the bases with no decreased or absent breath sounds. Gastrointestinal: Bowel sounds 4 quadrants. Soft and nontender to palpation. No guarding or rigidity. No palpable masses. No distention. No CVA tenderness. Musculoskeletal: Full range of motion to all extremities.  Visualization of the left lower extremity reveals a wound to the posterior left heel/foot.  Patient has a lesion into the dermal tissue throughout this  region.  There is surrounding erythema, tenderness extending up into the calf from this wound.  No purulent drainage.  No fluctuance concerning for abscess.  Patient does have mild bilateral pedal edema. Neurologic:  No gross focal neurologic deficits are appreciated.  Cranial nerves II through XII grossly intact. Skin:   No rash noted Other:        ED Results / Procedures / Treatments   Labs (all labs ordered are listed, but only abnormal results are displayed) Labs Reviewed  BASIC METABOLIC PANEL - Abnormal; Notable for the following components:      Result Value   Sodium 133 (*)    Glucose, Bld 302 (*)    Creatinine, Ser 1.35 (*)    Calcium 8.4 (*)    GFR, Estimated 50 (*)    All other components within normal limits  CBC - Abnormal; Notable for the following components:   WBC 12.4 (*)    All other components within normal limits  RESP PANEL BY RT-PCR (FLU A&B,  COVID) ARPGX2  CULTURE, BLOOD (ROUTINE X 2)  CULTURE, BLOOD (ROUTINE X 2)  URINALYSIS, ROUTINE W REFLEX MICROSCOPIC  LACTIC ACID, PLASMA  LACTIC ACID, PLASMA  BRAIN NATRIURETIC PEPTIDE     EKG     RADIOLOGY  I personally viewed and evaluated these images as part of my medical decision making, as well as reviewing the written report by the radiologist.  ED Provider Interpretation: No acute cardiopulmonary findings on chest x-ray.  CT of the head reveals an old lacunar infarct but no acute intracranial abnormality.  No evidence of osteomyelitis on x-ray of the foot  DG Chest 2 View  Result Date: 10/25/2021 CLINICAL DATA:  Weakness, fell EXAM: CHEST - 2 VIEW COMPARISON:  03/31/2021 FINDINGS: Frontal and lateral views of the chest demonstrate a stable cardiac silhouette. No acute airspace disease, effusion, or pneumothorax. No acute bony abnormalities. IMPRESSION: 1. Stable chest, no acute process. Electronically Signed   By: Randa Ngo M.D.   On: 10/25/2021 20:13   CT HEAD WO CONTRAST (5MM)  Result Date: 10/25/2021 CLINICAL DATA:  Head trauma, minor (Age >= 65y) fall EXAM: CT HEAD WITHOUT CONTRAST TECHNIQUE: Contiguous axial images were obtained from the base of the skull through the vertex without intravenous contrast. RADIATION DOSE REDUCTION: This exam was performed according to the departmental dose-optimization program which includes automated exposure control, adjustment of the mA and/or kV according to patient size and/or use of iterative reconstruction technique. COMPARISON:  03/31/2021 FINDINGS: Brain: Old left basal ganglia and periventricular lacunar infarct. There is atrophy and chronic small vessel disease changes. No acute intracranial abnormality. Specifically, no hemorrhage, hydrocephalus, mass lesion, acute infarction, or significant intracranial injury. Vascular: No hyperdense vessel or unexpected calcification. Skull: No acute calvarial abnormality. Sinuses/Orbits: No  acute findings Other: None IMPRESSION: Old left basal ganglia lacunar infarct, stable. Atrophy, chronic microvascular disease. No acute intracranial abnormality. Electronically Signed   By: Rolm Baptise M.D.   On: 10/25/2021 19:55   DG Foot Complete Left  Result Date: 10/25/2021 CLINICAL DATA:  Left heel wound EXAM: LEFT FOOT - COMPLETE 3+ VIEW COMPARISON:  None. FINDINGS: No acute bony abnormality. Specifically, no fracture, subluxation, or dislocation. No bone destruction to suggest osteomyelitis. No soft tissue gas or radiopaque foreign body. IMPRESSION: No acute bony abnormality. Electronically Signed   By: Rolm Baptise M.D.   On: 10/25/2021 22:06    PROCEDURES:  Critical Care performed: No  Procedures   MEDICATIONS ORDERED IN ED: Medications  clindamycin (CLEOCIN) IVPB 600 mg (600 mg Intravenous New Bag/Given 10/25/21 2250)  gabapentin (NEURONTIN) capsule 600 mg (600 mg Oral Given 10/25/21 2105)  sodium chloride 0.9 % bolus 500 mL (500 mLs Intravenous New Bag/Given 10/25/21 2252)     IMPRESSION / MDM / ASSESSMENT AND PLAN / ED COURSE  I reviewed the triage vital signs and the nursing notes.                              Differential diagnosis includes, but is not limited to, full, CVA, pneumonia, UTI, sepsis, cellulitis   Patient's diagnosis is consistent with fall secondary to infection in the left heel.  Patient presented to emergency department after sustaining a fall.  Patient apparently became weak while walking and slid down the side of a wall onto the floor.  Differential was broad initially but upon further discussion with the patient and his daughter it appears that patient has developed an infection to the left heel.  He has severe restless leg syndrome, has rubbed an area on his left heel overall.  There is dermal tissue exposed, see picture above.  Patient has surrounding cellulitis.  Apparently patient has had symptoms mostly beginning with falls when he has had infection so  differential included pneumonia, viral illness, UTI.  When we identified the source of infection of the left heel I feel that this is likely secondary to the patient's weakness and subsequent fall.  Feel the patient would benefit from IV antibiotics and discussed the patient for admission with the hospitalist service.  I feel that patient will likely require wound care as well given the cause of this skin breakdown as well as his symptoms..  At this time patient is admitted to the hospitalist service.       FINAL CLINICAL IMPRESSION(S) / ED DIAGNOSES   Final diagnoses:  Wound of foot  Cellulitis of foot  Weakness  Fall, initial encounter     Rx / DC Orders   ED Discharge Orders     None        Note:  This document was prepared using Dragon voice recognition software and may include unintentional dictation errors.   Darletta Moll, PA-C 10/25/21 2257    Naaman Plummer, MD 10/26/21 2039

## 2021-10-25 NOTE — Assessment & Plan Note (Signed)
Continue antiplatelets and statin

## 2021-10-25 NOTE — Assessment & Plan Note (Signed)
Continue home meds pending verification 

## 2021-10-25 NOTE — Assessment & Plan Note (Addendum)
Resume neurontin and Mirapex which daughter states has been very effective Consider neurology input.  Has seen [facility] in the past

## 2021-10-25 NOTE — ED Triage Notes (Signed)
Pt comes into the ED via [facility] from [facility] c/o weakness.  Pt explains that his legs went out on him due to weakness and it made him fall and slide down the wall.  Pt denies any currently hurting.  Pt afebrile.  Pt has h/o CVA with right side deficits.  Pt unable to stand independently with EMS,   130/64 79 Hr 98% RA 391 CBG 200 Normal saline given 18g Left AC.

## 2021-10-25 NOTE — Assessment & Plan Note (Signed)
Management as above °

## 2021-10-25 NOTE — Assessment & Plan Note (Addendum)
Secondary to acute infection Treat cellulitis/skin infection IV hydration Neurologic checks and fall precautions Follow-up urinalysis to rule out coexisting UTI

## 2021-10-25 NOTE — Assessment & Plan Note (Signed)
Basal insulin with sliding scale coverage Follow A1c

## 2021-10-25 NOTE — Assessment & Plan Note (Signed)
Renal function at baseline Renally dose all meds

## 2021-10-25 NOTE — ED Notes (Signed)
[facility] sent to the lab.  

## 2021-10-26 DIAGNOSIS — L03116 Cellulitis of left lower limb: Secondary | ICD-10-CM

## 2021-10-26 DIAGNOSIS — L089 Local infection of the skin and subcutaneous tissue, unspecified: Secondary | ICD-10-CM

## 2021-10-26 DIAGNOSIS — S90812A Abrasion, left foot, initial encounter: Secondary | ICD-10-CM

## 2021-10-26 DIAGNOSIS — R197 Diarrhea, unspecified: Secondary | ICD-10-CM

## 2021-10-26 LAB — BLOOD CULTURE ID PANEL (REFLEXED) - BCID2

## 2021-10-26 LAB — URINALYSIS, ROUTINE W REFLEX MICROSCOPIC
Bilirubin Urine: NEGATIVE
Glucose, UA: >=500 mg/dL — AB
Ketones, ur: NEGATIVE mg/dL
Nitrite: NEGATIVE
Protein, ur: 30 mg/dL — AB
Specific Gravity, Urine: 1.011 (ref 1.005–1.030)
WBC, UA: >50 WBC/hpf — ABNORMAL HIGH (ref 0–5)
pH: 5 (ref 5.0–8.0)

## 2021-10-26 LAB — GLUCOSE, CAPILLARY
Glucose-Capillary: 121 mg/dL — ABNORMAL HIGH (ref 70–99)
Glucose-Capillary: 152 mg/dL — ABNORMAL HIGH (ref 70–99)
Glucose-Capillary: 172 mg/dL — ABNORMAL HIGH (ref 70–99)
Glucose-Capillary: 177 mg/dL — ABNORMAL HIGH (ref 70–99)
Glucose-Capillary: 94 mg/dL (ref 70–99)

## 2021-10-26 LAB — CREATININE, SERUM
Creatinine, Ser: 1.36 mg/dL — ABNORMAL HIGH (ref 0.61–1.24)
GFR, Estimated: 50 mL/min — ABNORMAL LOW (ref >60)

## 2021-10-26 LAB — HEMOGLOBIN A1C
Hgb A1c MFr Bld: 7.2 % — ABNORMAL HIGH (ref 4.8–5.6)
Mean Plasma Glucose: 159.94 mg/dL

## 2021-10-26 LAB — LACTIC ACID, PLASMA: Lactic Acid, Venous: 1.8 mmol/L (ref 0.5–1.9)

## 2021-10-26 MED ORDER — LEVOTHYROXINE SODIUM 50 MCG PO TABS
50.0000 ug | ORAL_TABLET | Freq: Every day | ORAL | Status: DC
  Administered 2021-10-27 – 2021-11-02 (×7): 50 ug via ORAL
  Filled 2021-10-26 (×7): qty 1

## 2021-10-26 MED ORDER — GABAPENTIN 300 MG PO CAPS
600.0000 mg | ORAL_CAPSULE | Freq: Two times a day (BID) | ORAL | Status: DC
  Administered 2021-10-26 – 2021-11-02 (×16): 600 mg via ORAL
  Filled 2021-10-26 (×16): qty 2

## 2021-10-26 MED ORDER — SODIUM CHLORIDE 0.9 % IV SOLN: INTRAVENOUS | Status: DC

## 2021-10-26 MED ORDER — VANCOMYCIN HCL 1250 MG/250ML IV SOLN
1250.0000 mg | INTRAVENOUS | Status: DC
  Administered 2021-10-27: 1250 mg via INTRAVENOUS
  Filled 2021-10-26: qty 250

## 2021-10-26 MED ORDER — DULOXETINE HCL 30 MG PO CPEP
60.0000 mg | ORAL_CAPSULE | Freq: Every day | ORAL | Status: DC
  Administered 2021-10-26 – 2021-11-02 (×8): 60 mg via ORAL
  Filled 2021-10-26 (×8): qty 2

## 2021-10-26 MED ORDER — GABAPENTIN 300 MG PO CAPS: 600.0000 mg | ORAL_CAPSULE | Freq: Every day | ORAL | Status: DC | PRN

## 2021-10-26 NOTE — Progress Notes (Addendum)
PROGRESS NOTE    [name], MD  GDJ:[id number] DOB: [birth year] DOA: 10/25/2021 PCP: [name], MD   Assessment & Plan:   Principal Problem:   Postural dizziness with presyncope Active Problems:   Hemiparesis affecting right side as late effect of cerebrovascular accident (CVA) ([place])   Uncontrolled type 2 diabetes mellitus with hyperglycemia, with long-term current use of insulin (HCC)   Stage 3a chronic kidney disease (HCC)   Restless leg syndrome, severe   HTN (hypertension)   Frequent UTI   Abrasion of heel, infected, left, initial encounter   Cellulitis of left foot   Diarrhea   Postural dizziness: w/ presyncope. Etiology unclear. Continue on IVFs   Cellulitis of left foot: continue on IV rocephin, vanco. Podiatry consulted as per admitting physician    Left infected abrasion of heel: friction burn left heel secondary to restless leg syndrome with superimposed infection. Continue on IV rocephin, vanco. Wound care consulted. Podiatry consulted   Unlikely bacteremia: blood cx growing stap epi, likely containment. Will repeat blood cxs tomorrow    DM2: poorly controlled. Continue on glargine, SSI w/ accuchecks   Diarrhea: etiology unclear. GI PCR panel & c. diff ordered   Frequent UTIs: last UTI 08/2021 & treated. Urine cx ordered   Restless leg syndrome: continue on home dose of pramipexole   CKDIIIa: Cr is at baseline    Hemiparesis affecting right side: residual effect of CVA. Continue on plavix, statin     DVT prophylaxis: lovenox  Code Status: full  Family Communication:  Disposition Plan: depends on PT/OT recs   Level of care: Med-Surg  Status is: Inpatient Remains inpatient appropriate because: severity of illness     Consultants:  Podiatry   Procedures:   Antimicrobials: vanco, rocephin    Subjective: Pt c/o heel pain   Objective: Vitals:   10/26/21 0417 10/26/21 0804 10/26/21 1146 10/26/21 1554  BP: 115/61 (!) 125/58 (!)  146/67 (!) 115/44  Pulse: 78 87 97 100  Resp: 16 16 16 15   Temp: 99.4 F (37.4 C) 97.9 F (36.6 C) 97.9 F (36.6 C) 98.1 F (36.7 C)  TempSrc: Oral     SpO2: 97% 98% 98% 95%  Weight:      Height:        Intake/Output Summary (Last 24 hours) at 10/26/2021 1617 Last data filed at 10/26/2021 0844 Gross per 24 hour  Intake 919.32 ml  Output 1200 ml  Net -280.68 ml   Filed Weights   10/25/21 1703  Weight: 88.9 kg    Examination:  General exam: Appears calm and comfortable  Respiratory system: Clear to auscultation. Respiratory effort normal. Cardiovascular system: S1 & S2 +. No rubs, gallops or clicks.  Gastrointestinal system: Abdomen is nondistended, soft and nontender. Normal bowel sounds heard. Central nervous system: Alert and oriented. Moves all extremities  Psychiatry: Judgement and insight appear normal. Flat mood and affect    Data Reviewed: I have personally reviewed following labs and imaging studies  CBC: Recent Labs  Lab 10/25/21 1705  WBC 12.4*  HGB 13.3  HCT 40.4  MCV 92.0  PLT 622   Basic Metabolic Panel: Recent Labs  Lab 10/25/21 1705 10/26/21 0828  NA 133*  --   K 4.2  --   CL 101  --   CO2 25  --   GLUCOSE 302*  --   BUN 20  --   CREATININE 1.35* 1.36*  CALCIUM 8.4*  --    GFR: Estimated Creatinine Clearance: 40.7  mL/min (A) (by C-G formula based on SCr of 1.36 mg/dL (H)). Liver Function Tests: No results for input(s): AST, ALT, ALKPHOS, BILITOT, PROT, ALBUMIN in the last 168 hours. No results for input(s): LIPASE, AMYLASE in the last 168 hours. No results for input(s): AMMONIA in the last 168 hours. Coagulation Profile: No results for input(s): INR, PROTIME in the last 168 hours. Cardiac Enzymes: No results for input(s): CKTOTAL, CKMB, CKMBINDEX, TROPONINI in the last 168 hours. BNP (last 3 results) No results for input(s): PROBNP in the last 8760 hours. HbA1C: Recent Labs    10/25/21 1705  HGBA1C 7.2*   CBG: Recent Labs   Lab 10/26/21 0036 10/26/21 0808 10/26/21 1140 10/26/21 1549  GLUCAP 172* 94 152* 121*   Lipid Profile: No results for input(s): CHOL, HDL, LDLCALC, TRIG, CHOLHDL, LDLDIRECT in the last 72 hours. Thyroid Function Tests: No results for input(s): TSH, T4TOTAL, FREET4, T3FREE, THYROIDAB in the last 72 hours. Anemia Panel: No results for input(s): VITAMINB12, FOLATE, FERRITIN, TIBC, IRON, RETICCTPCT in the last 72 hours. Sepsis Labs: Recent Labs  Lab 10/25/21 2232 10/26/21 0049  LATICACIDVEN 1.4 1.8    Recent Results (from the past 240 hour(s))  Urine Culture     Status: None   Collection Time: 10/19/21 10:32 AM   Specimen: Urine  Result Value Ref Range Status   MICRO NUMBER: [id number]  Final   SPECIMEN QUALITY: Adequate  Final   Sample Source NOT GIVEN  Final   STATUS: FINAL  Final   Result: No Growth  Final  Resp Panel by RT-PCR (Flu A&B, Covid) Nasopharyngeal Swab     Status: None   Collection Time: 10/25/21 10:32 PM   Specimen: Nasopharyngeal Swab; Nasopharyngeal(NP) swabs in vial transport medium  Result Value Ref Range Status   SARS Coronavirus 2 by RT PCR NEGATIVE NEGATIVE Final    Comment: (NOTE) SARS-CoV-2 target nucleic acids are NOT DETECTED.  The SARS-CoV-2 RNA is generally detectable in upper respiratory specimens during the acute phase of infection. The lowest concentration of SARS-CoV-2 viral copies this assay can detect is 138 copies/mL. A negative result does not preclude SARS-Cov-2 infection and should not be used as the sole basis for treatment or other patient management decisions. A negative result may occur with  improper specimen collection/handling, submission of specimen other than nasopharyngeal swab, presence of viral mutation(s) within the areas targeted by this assay, and inadequate number of viral copies(<138 copies/mL). A negative result must be combined with clinical observations, patient history, and epidemiological information. The  expected result is Negative.  Fact Sheet for Patients:  EntrepreneurPulse.com.au  Fact Sheet for Healthcare Providers:  IncredibleEmployment.be  This test is no t yet approved or cleared by the [place] FDA and  has been authorized for detection and/or diagnosis of SARS-CoV-2 by FDA under an Emergency Use Authorization (EUA). This EUA will remain  in effect (meaning this test can be used) for the duration of the COVID-19 declaration under Section 564(b)(1) of the Act, 21 U.S.C.section 360bbb-3(b)(1), unless the authorization is terminated  or revoked sooner.       Influenza A by PCR NEGATIVE NEGATIVE Final   Influenza B by PCR NEGATIVE NEGATIVE Final    Comment: (NOTE) The Xpert Xpress SARS-CoV-2/FLU/RSV plus assay is intended as an aid in the diagnosis of influenza from Nasopharyngeal swab specimens and should not be used as a sole basis for treatment. Nasal washings and aspirates are unacceptable for Xpert Xpress SARS-CoV-2/FLU/RSV testing.  Fact Sheet for Patients: EntrepreneurPulse.com.au  Fact Sheet for Healthcare Providers: IncredibleEmployment.be  This test is not yet approved or cleared by the [place] FDA and has been authorized for detection and/or diagnosis of SARS-CoV-2 by FDA under an Emergency Use Authorization (EUA). This EUA will remain in effect (meaning this test can be used) for the duration of the COVID-19 declaration under Section 564(b)(1) of the Act, 21 U.S.C. section 360bbb-3(b)(1), unless the authorization is terminated or revoked.  Performed at [facility], [address] [place], [place] [address]   Culture, blood (routine x 2)     Status: None (Preliminary result)   Collection Time: 10/25/21 10:33 PM   Specimen: BLOOD  Result Value Ref Range Status   Specimen Description BLOOD LEFT ASSIST CONTROL  Final   Special Requests   Final    BOTTLES DRAWN  AEROBIC AND ANAEROBIC Blood Culture adequate volume   Culture  Setup Time   Final    GRAM POSITIVE COCCI Organism ID to follow CRITICAL RESULT CALLED TO, READ BACK BY AND VERIFIED WITH: ABBY ELLINGTON @1438  10/26/21 SCS ANAEROBIC BOTTLE ONLY Performed at [facility], [address] [place], [place] [address]    Culture GRAM POSITIVE COCCI  Final   Report Status PENDING  Incomplete  Blood Culture ID Panel (Reflexed)     Status: Abnormal   Collection Time: 10/25/21 10:33 PM  Result Value Ref Range Status   Enterococcus faecalis NOT DETECTED NOT DETECTED Final   Enterococcus Faecium NOT DETECTED NOT DETECTED Final   Listeria monocytogenes NOT DETECTED NOT DETECTED Final   Staphylococcus species DETECTED (A) NOT DETECTED Final    Comment: CRITICAL RESULT CALLED TO, READ BACK BY AND VERIFIED WITH: ABBY ELLINGTON @1438  10/26/21 SCS    Staphylococcus aureus (BCID) NOT DETECTED NOT DETECTED Final   Staphylococcus epidermidis DETECTED (A) NOT DETECTED Final    Comment: Methicillin (oxacillin) resistant coagulase negative staphylococcus. Possible blood culture contaminant (unless isolated from more than one blood culture draw or clinical case suggests pathogenicity). No antibiotic treatment is indicated for blood  culture contaminants. CRITICAL RESULT CALLED TO, READ BACK BY AND VERIFIED WITH: ABBY ELLINGTON @1438  10/26/21 SCS    Staphylococcus lugdunensis NOT DETECTED NOT DETECTED Final   Streptococcus species NOT DETECTED NOT DETECTED Final   Streptococcus agalactiae NOT DETECTED NOT DETECTED Final   Streptococcus pneumoniae NOT DETECTED NOT DETECTED Final   Streptococcus pyogenes NOT DETECTED NOT DETECTED Final   A.calcoaceticus-baumannii NOT DETECTED NOT DETECTED Final   Bacteroides fragilis NOT DETECTED NOT DETECTED Final   Enterobacterales NOT DETECTED NOT DETECTED Final   Enterobacter cloacae complex NOT DETECTED NOT DETECTED Final   Escherichia coli NOT DETECTED NOT DETECTED  Final   Klebsiella aerogenes NOT DETECTED NOT DETECTED Final   Klebsiella oxytoca NOT DETECTED NOT DETECTED Final   Klebsiella pneumoniae NOT DETECTED NOT DETECTED Final   Proteus species NOT DETECTED NOT DETECTED Final   Salmonella species NOT DETECTED NOT DETECTED Final   Serratia marcescens NOT DETECTED NOT DETECTED Final   Haemophilus influenzae NOT DETECTED NOT DETECTED Final   Neisseria meningitidis NOT DETECTED NOT DETECTED Final   Pseudomonas aeruginosa NOT DETECTED NOT DETECTED Final   Stenotrophomonas maltophilia NOT DETECTED NOT DETECTED Final   Candida albicans NOT DETECTED NOT DETECTED Final   Candida auris NOT DETECTED NOT DETECTED Final   Candida glabrata NOT DETECTED NOT DETECTED Final   Candida krusei NOT DETECTED NOT DETECTED Final   Candida parapsilosis NOT DETECTED NOT DETECTED Final   Candida tropicalis NOT DETECTED NOT  DETECTED Final   Cryptococcus neoformans/gattii NOT DETECTED NOT DETECTED Final   Methicillin resistance mecA/C DETECTED (A) NOT DETECTED Final    Comment: CRITICAL RESULT CALLED TO, READ BACK BY AND VERIFIED WITH: ABBY ELLINGTON @1438  10/26/21 SCS Performed at [facility], [address] [place], [place] [address]   Culture, blood (Routine X 2) w Reflex to ID Panel     Status: None (Preliminary result)   Collection Time: 10/26/21 12:49 AM   Specimen: BLOOD  Result Value Ref Range Status   Specimen Description BLOOD RIGHT HAND  Final   Special Requests   Final    BOTTLES DRAWN AEROBIC ONLY Blood Culture results may not be optimal due to an inadequate volume of blood received in culture bottles   Culture   Final    NO GROWTH < 24 HOURS Performed at [facility], [address], [place], [place] [address]    Report Status PENDING  Incomplete         Radiology Studies: DG Chest 2 View  Result Date: 10/25/2021 CLINICAL DATA:  Weakness, fell EXAM: CHEST - 2 VIEW COMPARISON:  03/31/2021 FINDINGS: Frontal and lateral views  of the chest demonstrate a stable cardiac silhouette. No acute airspace disease, effusion, or pneumothorax. No acute bony abnormalities. IMPRESSION: 1. Stable chest, no acute process. Electronically Signed   By: Randa Ngo M.D.   On: 10/25/2021 20:13   CT HEAD WO CONTRAST (5MM)  Result Date: 10/25/2021 CLINICAL DATA:  Head trauma, minor (Age >= 65y) fall EXAM: CT HEAD WITHOUT CONTRAST TECHNIQUE: Contiguous axial images were obtained from the base of the skull through the vertex without intravenous contrast. RADIATION DOSE REDUCTION: This exam was performed according to the departmental dose-optimization program which includes automated exposure control, adjustment of the mA and/or kV according to patient size and/or use of iterative reconstruction technique. COMPARISON:  03/31/2021 FINDINGS: Brain: Old left basal ganglia and periventricular lacunar infarct. There is atrophy and chronic small vessel disease changes. No acute intracranial abnormality. Specifically, no hemorrhage, hydrocephalus, mass lesion, acute infarction, or significant intracranial injury. Vascular: No hyperdense vessel or unexpected calcification. Skull: No acute calvarial abnormality. Sinuses/Orbits: No acute findings Other: None IMPRESSION: Old left basal ganglia lacunar infarct, stable. Atrophy, chronic microvascular disease. No acute intracranial abnormality. Electronically Signed   By: Rolm Baptise M.D.   On: 10/25/2021 19:55   DG Foot Complete Left  Result Date: 10/25/2021 CLINICAL DATA:  Left heel wound EXAM: LEFT FOOT - COMPLETE 3+ VIEW COMPARISON:  None. FINDINGS: No acute bony abnormality. Specifically, no fracture, subluxation, or dislocation. No bone destruction to suggest osteomyelitis. No soft tissue gas or radiopaque foreign body. IMPRESSION: No acute bony abnormality. Electronically Signed   By: Rolm Baptise M.D.   On: 10/25/2021 22:06        Scheduled Meds:  clopidogrel  75 mg Oral Daily   DULoxetine  60 mg Oral  Daily   enoxaparin (LOVENOX) injection  40 mg Subcutaneous Q24H   gabapentin  600 mg Oral BID   insulin aspart  0-15 Units Subcutaneous TID WC   insulin aspart  0-5 Units Subcutaneous QHS   insulin glargine-yfgn  20 Units Subcutaneous QHS   [START ON 10/27/2021] levothyroxine  50 mcg Oral Q0600   melatonin  5 mg Oral QHS   pantoprazole  40 mg Oral Q breakfast   pramipexole  1 mg Oral q1600   tamsulosin  0.4 mg Oral QPC breakfast   Continuous Infusions:  sodium chloride 75 mL/hr  at 10/26/21 0927   cefTRIAXone (ROCEPHIN)  IV 2 g (10/26/21 0059)   [START ON 10/27/2021] vancomycin       LOS: 1 day    Time spent: 30 mins     Wyvonnia Dusky, MD [facility] Pager [phone]  If 7PM-7AM, please contact night-coverage 10/26/2021, 4:17 PM

## 2021-10-26 NOTE — Plan of Care (Signed)
  Problem: Clinical Measurements: Goal: Ability to avoid or minimize complications of infection will improve Outcome: Progressing   Problem: Skin Integrity: Goal: Skin integrity will improve Outcome: Progressing   Problem: Education: Goal: Knowledge of General Education information will improve Description: Including pain rating scale, medication(s)/side effects and non-pharmacologic comfort measures Outcome: Progressing   Problem: Health Behavior/Discharge Planning: Goal: Ability to manage health-related needs will improve Outcome: Progressing   Problem: Clinical Measurements: Goal: Ability to maintain clinical measurements within normal limits will improve Outcome: Progressing Goal: Will remain free from infection Outcome: Progressing Goal: Diagnostic test results will improve Outcome: Progressing Goal: Respiratory complications will improve Outcome: Progressing Goal: Cardiovascular complication will be avoided Outcome: Progressing   

## 2021-10-26 NOTE — Assessment & Plan Note (Signed)
Patient reports weeks of diarrhea in the setting of antibiotics for UTI Stool for C. difficile and GI panel ordered

## 2021-10-26 NOTE — Assessment & Plan Note (Signed)
Chart review reveals Klebsiella UTI 09/09/2021, treated Follow-up UA/urine culture to

## 2021-10-26 NOTE — TOC Progression Note (Signed)
Transition of Care [facility]) - Progression Note    Patient Details  Name: James DEVINS, MD MRN: [id number] Date of Birth: 1931-07-30  [facility]) CM/SW [place], RN Phone Number: 10/26/2021, 10:11 AM  Clinical Narrative:   The patient is a resident at [facility], he comes with a wound on the left Heel area from rubbing it on the bed due to Restless Leg Syndrome, he feels that he may have a UTI, PT to eval, TOC to assist with DC planning         Expected Discharge Plan and Services                                                 Social Determinants of Health (SDOH) Interventions    Readmission Risk Interventions No flowsheet data found.

## 2021-10-26 NOTE — Progress Notes (Signed)
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  BCID detects MRSE.  Heel  wound from rubbing on bed secondary to RLS.  Concern for possible UTI although no mention of dysuria  Name of physician (or Provider) Contacted: Dr Jimmye Norman  Current antibiotics: Vancomycin and ceftriaxone  Changes to prescribed antibiotics recommended:  Patient is on recommended antibiotics - No changes needed. Currently on vancomycin for SSTI,  follow-up to determine significance of single bottle positivity  Results for orders placed or performed during the hospital encounter of 10/25/21  Blood Culture ID Panel (Reflexed) (Collected: 10/25/2021 10:33 PM)  Result Value Ref Range   Enterococcus faecalis NOT DETECTED NOT DETECTED   Enterococcus Faecium NOT DETECTED NOT DETECTED   Listeria monocytogenes NOT DETECTED NOT DETECTED   Staphylococcus species DETECTED (A) NOT DETECTED   Staphylococcus aureus (BCID) NOT DETECTED NOT DETECTED   Staphylococcus epidermidis DETECTED (A) NOT DETECTED   Staphylococcus lugdunensis NOT DETECTED NOT DETECTED   Streptococcus species NOT DETECTED NOT DETECTED   Streptococcus agalactiae NOT DETECTED NOT DETECTED   Streptococcus pneumoniae NOT DETECTED NOT DETECTED   Streptococcus pyogenes NOT DETECTED NOT DETECTED   A.calcoaceticus-baumannii NOT DETECTED NOT DETECTED   Bacteroides fragilis NOT DETECTED NOT DETECTED   Enterobacterales NOT DETECTED NOT DETECTED   Enterobacter cloacae complex NOT DETECTED NOT DETECTED   Escherichia coli NOT DETECTED NOT DETECTED   Klebsiella aerogenes NOT DETECTED NOT DETECTED   Klebsiella oxytoca NOT DETECTED NOT DETECTED   Klebsiella pneumoniae NOT DETECTED NOT DETECTED   Proteus species NOT DETECTED NOT DETECTED    Salmonella species NOT DETECTED NOT DETECTED   Serratia marcescens NOT DETECTED NOT DETECTED   Haemophilus influenzae NOT DETECTED NOT DETECTED   Neisseria meningitidis NOT DETECTED NOT DETECTED   Pseudomonas aeruginosa NOT DETECTED NOT DETECTED   Stenotrophomonas maltophilia NOT DETECTED NOT DETECTED   Candida albicans NOT DETECTED NOT DETECTED   Candida auris NOT DETECTED NOT DETECTED   Candida glabrata NOT DETECTED NOT DETECTED   Candida krusei NOT DETECTED NOT DETECTED   Candida parapsilosis NOT DETECTED NOT DETECTED   Candida tropicalis NOT DETECTED NOT DETECTED   Cryptococcus neoformans/gattii NOT DETECTED NOT DETECTED   Methicillin resistance mecA/C DETECTED (A) NOT DETECTED    Doreene Eland, PharmD, BCPS, BCIDP Work Cell: [phone] 10/26/2021 3:43 PM

## 2021-10-26 NOTE — Progress Notes (Signed)
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  Pharmacy has been consulted for Vancomycin dosing.  Plan: Ordered initial dose of Vancomycin 2 gm per pt wt: 88.9 kg Vancomycin 1250 mg IV Q 24 hrs.  Goal AUC 400-550. Expected AUC: 445 SCr used: 1.35  Pharmacy will continue to follow and will adjust abx dosing whenever warranted.   Height: 5\' 8"  (172.7 cm) Weight: 88.9 kg (196 lb) IBW/kg (Calculated) : 68.4  Temp (24hrs), Avg:99.4 F (37.4 C), Min:98.5 F (36.9 C), Max:100.3 F (37.9 C)  Recent Labs  Lab 10/25/21 1705 10/25/21 2232  WBC 12.4*  --   CREATININE 1.35*  --   LATICACIDVEN  --  1.4    Estimated Creatinine Clearance: 41 mL/min (A) (by C-G formula based on SCr of 1.35 mg/dL (H)).    Allergies  Allergen Reactions   Crestor [Rosuvastatin]     Other reaction(s): muscle pain 2.16.2022 Pt is current on this medication without any issues.   Lisinopril Cough    tachycardia   Nexium [Esomeprazole] Other (See Comments)    Headache   Oxycodone Other (See Comments)    Even 1/2 tablet of 5mg  caused increased confusion - contributory to hospitalization 10/2020 with AMS    Antimicrobials this admission: 2/7 Clindamycin >> x 1 dose 2/8 Ceftriaxone >> x 7 days 2/8 Vancomycin >>   Microbiology results: 2/07 BCx: Pending  Thank you for allowing pharmacy to be a part of this patients care.  Renda Rolls, PharmD, [facility] 10/26/2021 12:48 AM

## 2021-10-26 NOTE — Progress Notes (Signed)
PHARMACIST - PHYSICIAN ORDER COMMUNICATION  CONCERNING: P&T Medication Policy on Herbal Medications  DESCRIPTION:  This patients order for:  Alpha-Lipoic Acid CAPS 600 mg  has been noted.  This product(s) is classified as an herbal or natural product. Due to a lack of definitive safety studies or FDA approval, nonstandard manufacturing practices, plus the potential risk of unknown drug-drug interactions while on inpatient medications, the Pharmacy and Therapeutics Committee does not permit the use of herbal or natural products of this type within [facility].   ACTION TAKEN: The pharmacy department is unable to verify this order at this time. Please reevaluate patients clinical condition at discharge and address if the herbal or natural product(s) should be resumed at that time.   Renda Rolls, PharmD, [facility] 10/26/2021 12:26 AM

## 2021-10-27 ENCOUNTER — Telehealth: Payer: Self-pay

## 2021-10-27 DIAGNOSIS — N1831 Chronic kidney disease, stage 3a: Secondary | ICD-10-CM

## 2021-10-27 LAB — GLUCOSE, CAPILLARY
Glucose-Capillary: 113 mg/dL — ABNORMAL HIGH (ref 70–99)
Glucose-Capillary: 139 mg/dL — ABNORMAL HIGH (ref 70–99)
Glucose-Capillary: 142 mg/dL — ABNORMAL HIGH (ref 70–99)
Glucose-Capillary: 154 mg/dL — ABNORMAL HIGH (ref 70–99)

## 2021-10-27 LAB — CBC
HCT: 38.3 % — ABNORMAL LOW (ref 39.0–52.0)
Hemoglobin: 12.4 g/dL — ABNORMAL LOW (ref 13.0–17.0)
MCH: 29.5 pg (ref 26.0–34.0)
MCHC: 32.4 g/dL (ref 30.0–36.0)
MCV: 91 fL (ref 80.0–100.0)
Platelets: 178 10*3/uL (ref 150–400)
RBC: 4.21 MIL/uL — ABNORMAL LOW (ref 4.22–5.81)
RDW: 13.9 % (ref 11.5–15.5)
WBC: 13.9 10*3/uL — ABNORMAL HIGH (ref 4.0–10.5)
nRBC: 0 % (ref 0.0–0.2)

## 2021-10-27 LAB — BASIC METABOLIC PANEL
Anion gap: 4 — ABNORMAL LOW (ref 5–15)
BUN: 17 mg/dL (ref 8–23)
CO2: 25 mmol/L (ref 22–32)
Calcium: 7.8 mg/dL — ABNORMAL LOW (ref 8.9–10.3)
Chloride: 107 mmol/L (ref 98–111)
Creatinine, Ser: 1.5 mg/dL — ABNORMAL HIGH (ref 0.61–1.24)
GFR, Estimated: 45 mL/min — ABNORMAL LOW (ref >60)
Glucose, Bld: 148 mg/dL — ABNORMAL HIGH (ref 70–99)
Potassium: 4.4 mmol/L (ref 3.5–5.1)
Sodium: 136 mmol/L (ref 135–145)

## 2021-10-27 LAB — MRSA NEXT GEN BY PCR, NASAL: MRSA by PCR Next Gen: DETECTED — AB

## 2021-10-27 MED ORDER — VANCOMYCIN HCL IN DEXTROSE 1-5 GM/200ML-% IV SOLN: 1000.0000 mg | INTRAVENOUS | Status: DC

## 2021-10-27 MED ORDER — SODIUM CHLORIDE 0.9 % IV SOLN
100.0000 mg | Freq: Two times a day (BID) | INTRAVENOUS | Status: DC
  Administered 2021-10-27 – 2021-10-28 (×2): 100 mg via INTRAVENOUS
  Filled 2021-10-27 (×3): qty 100

## 2021-10-27 NOTE — Progress Notes (Signed)
PROGRESS NOTE    [name], MD  IPJ:[id number] DOB: 1931/04/21 DOA: 10/25/2021 PCP: [name], MD   Assessment & Plan:   Principal Problem:   Postural dizziness with presyncope Active Problems:   Hemiparesis affecting right side as late effect of cerebrovascular accident (CVA) ([place])   Uncontrolled type 2 diabetes mellitus with hyperglycemia, with long-term current use of insulin (HCC)   Stage 3a chronic kidney disease (HCC)   Restless leg syndrome, severe   HTN (hypertension)   Frequent UTI   Abrasion of heel, infected, left, initial encounter   Cellulitis of left foot   Diarrhea   Postural dizziness: w/ presyncope. Etiology unclear. Improved    Cellulitis of left foot: continue on IV rocephin and changed IV vanco to IV doxy. No debridement needed as per podiatry    Left infected abrasion of heel: friction burn left heel secondary to restless leg syndrome with superimposed infection. Continue on IV rocephin and changed IV vanco to IV doxy. No debridement needed as per podiatry. Podiatry recs apprec   Unlikely bacteremia: blood cx growing stap epi, likely containment. Repeat blood cxs ordered today    DM2: HbA1c 7.2, fairly well controlled. Continue on glargine, SSI w/ accuchecks    Diarrhea: etiology unclear. GI PCR panel & c. diff ordered but unable to be collected yet    Frequent UTIs: last UTI 08/2021 & treated. Urine cx is pending    Restless leg syndrome: continue on home dose of pramipexole    CKDIIIa: Cr is trending up from day prior. Avoid nephrotoxic meds    Hemiparesis affecting right side: residual effect of CVA. Continue on plavix, statin     DVT prophylaxis: lovenox  Code Status: full  Family Communication: discussed pt's care w/ pt's daughter, Wells Guiles, and answered her questions  Disposition Plan: possibly d/c to SNF  Level of care: Med-Surg  Status is: Inpatient Remains inpatient appropriate because: severity of  illness     Consultants:  Podiatry   Procedures:   Antimicrobials: doxycycline, rocephin    Subjective: Pt c/o malaise   Objective: Vitals:   10/26/21 1554 10/26/21 2018 10/26/21 2306 10/27/21 0343  BP: (!) 115/44 100/78 (!) 112/56 (!) 114/56  Pulse: 100 91 94 86  Resp: 15 16 17 16   Temp: 98.1 F (36.7 C) 99.1 F (37.3 C) 98.3 F (36.8 C) 98.2 F (36.8 C)  TempSrc:      SpO2: 95% 100% 96% 99%  Weight:      Height:        Intake/Output Summary (Last 24 hours) at 10/27/2021 0757 Last data filed at 10/26/2021 0844 Gross per 24 hour  Intake --  Output 1000 ml  Net -1000 ml   Filed Weights   10/25/21 1703  Weight: 88.9 kg    Examination:  General exam: Appears comfortable  Respiratory system: clear breath sounds b/l  Cardiovascular system: S1/S2+. No rubs or clicks   Gastrointestinal system: Abd is soft, NT, ND & hypoactive bowel sounds Central nervous system: Alert and oriented. Moves all extremities   Psychiatry: Judgement and insight appears normal. Flat mood and affect    Data Reviewed: I have personally reviewed following labs and imaging studies  CBC: Recent Labs  Lab 10/25/21 1705 10/27/21 0326  WBC 12.4* 13.9*  HGB 13.3 12.4*  HCT 40.4 38.3*  MCV 92.0 91.0  PLT 193 734   Basic Metabolic Panel: Recent Labs  Lab 10/25/21 1705 10/26/21 0828 10/27/21 0326  NA 133*  --  136  K 4.2  --  4.4  CL 101  --  107  CO2 25  --  25  GLUCOSE 302*  --  148*  BUN 20  --  17  CREATININE 1.35* 1.36* 1.50*  CALCIUM 8.4*  --  7.8*   GFR: Estimated Creatinine Clearance: 36.9 mL/min (A) (by C-G formula based on SCr of 1.5 mg/dL (H)). Liver Function Tests: No results for input(s): AST, ALT, ALKPHOS, BILITOT, PROT, ALBUMIN in the last 168 hours. No results for input(s): LIPASE, AMYLASE in the last 168 hours. No results for input(s): AMMONIA in the last 168 hours. Coagulation Profile: No results for input(s): INR, PROTIME in the last 168 hours. Cardiac  Enzymes: No results for input(s): CKTOTAL, CKMB, CKMBINDEX, TROPONINI in the last 168 hours. BNP (last 3 results) No results for input(s): PROBNP in the last 8760 hours. HbA1C: Recent Labs    10/25/21 1705  HGBA1C 7.2*   CBG: Recent Labs  Lab 10/26/21 0036 10/26/21 0808 10/26/21 1140 10/26/21 1549 10/26/21 2103  GLUCAP 172* 94 152* 121* 177*   Lipid Profile: No results for input(s): CHOL, HDL, LDLCALC, TRIG, CHOLHDL, LDLDIRECT in the last 72 hours. Thyroid Function Tests: No results for input(s): TSH, T4TOTAL, FREET4, T3FREE, THYROIDAB in the last 72 hours. Anemia Panel: No results for input(s): VITAMINB12, FOLATE, FERRITIN, TIBC, IRON, RETICCTPCT in the last 72 hours. Sepsis Labs: Recent Labs  Lab 10/25/21 2232 10/26/21 0049  LATICACIDVEN 1.4 1.8    Recent Results (from the past 240 hour(s))  Urine Culture     Status: None   Collection Time: 10/19/21 10:32 AM   Specimen: Urine  Result Value Ref Range Status   MICRO NUMBER: [id number]  Final   SPECIMEN QUALITY: Adequate  Final   Sample Source NOT GIVEN  Final   STATUS: FINAL  Final   Result: No Growth  Final  Resp Panel by RT-PCR (Flu A&B, Covid) Nasopharyngeal Swab     Status: None   Collection Time: 10/25/21 10:32 PM   Specimen: Nasopharyngeal Swab; Nasopharyngeal(NP) swabs in vial transport medium  Result Value Ref Range Status   SARS Coronavirus 2 by RT PCR NEGATIVE NEGATIVE Final    Comment: (NOTE) SARS-CoV-2 target nucleic acids are NOT DETECTED.  The SARS-CoV-2 RNA is generally detectable in upper respiratory specimens during the acute phase of infection. The lowest concentration of SARS-CoV-2 viral copies this assay can detect is 138 copies/mL. A negative result does not preclude SARS-Cov-2 infection and should not be used as the sole basis for treatment or other patient management decisions. A negative result may occur with  improper specimen collection/handling, submission of specimen other than  nasopharyngeal swab, presence of viral mutation(s) within the areas targeted by this assay, and inadequate number of viral copies(<138 copies/mL). A negative result must be combined with clinical observations, patient history, and epidemiological information. The expected result is Negative.  Fact Sheet for Patients:  EntrepreneurPulse.com.au  Fact Sheet for Healthcare Providers:  IncredibleEmployment.be  This test is no t yet approved or cleared by the [place] FDA and  has been authorized for detection and/or diagnosis of SARS-CoV-2 by FDA under an Emergency Use Authorization (EUA). This EUA will remain  in effect (meaning this test can be used) for the duration of the COVID-19 declaration under Section 564(b)(1) of the Act, 21 U.S.C.section 360bbb-3(b)(1), unless the authorization is terminated  or revoked sooner.       Influenza A by PCR NEGATIVE NEGATIVE Final   Influenza B by PCR NEGATIVE  NEGATIVE Final    Comment: (NOTE) The Xpert Xpress SARS-CoV-2/FLU/RSV plus assay is intended as an aid in the diagnosis of influenza from Nasopharyngeal swab specimens and should not be used as a sole basis for treatment. Nasal washings and aspirates are unacceptable for Xpert Xpress SARS-CoV-2/FLU/RSV testing.  Fact Sheet for Patients: EntrepreneurPulse.com.au  Fact Sheet for Healthcare Providers: IncredibleEmployment.be  This test is not yet approved or cleared by the [place] FDA and has been authorized for detection and/or diagnosis of SARS-CoV-2 by FDA under an Emergency Use Authorization (EUA). This EUA will remain in effect (meaning this test can be used) for the duration of the COVID-19 declaration under Section 564(b)(1) of the Act, 21 U.S.C. section 360bbb-3(b)(1), unless the authorization is terminated or revoked.  Performed at [facility], [address] [place], [place]  [address]   Culture, blood (routine x 2)     Status: None (Preliminary result)   Collection Time: 10/25/21 10:33 PM   Specimen: BLOOD  Result Value Ref Range Status   Specimen Description   Final    BLOOD LEFT ANTECUBITAL Performed at [facility], [place] [address], [place], [place] [address]    Special Requests   Final    BOTTLES DRAWN AEROBIC AND ANAEROBIC Blood Culture adequate volume Performed at [facility], [address] [place], [place] [address]    Culture  Setup Time   Final    GRAM POSITIVE COCCI Organism ID to follow CRITICAL RESULT CALLED TO, READ BACK BY AND VERIFIED WITH: ABBY ELLINGTON @1438  10/26/21 SCS IN BOTH AEROBIC AND ANAEROBIC BOTTLES Performed at [facility], [address] [place], [place] [address]    Culture   Final    Lonell Grandchild POSITIVE COCCI CULTURE REINCUBATED FOR BETTER GROWTH Performed at [facility], [place] [address], [place], [place] [address]    Report Status PENDING  Incomplete  Blood Culture ID Panel (Reflexed)     Status: Abnormal   Collection Time: 10/25/21 10:33 PM  Result Value Ref Range Status   Enterococcus faecalis NOT DETECTED NOT DETECTED Final   Enterococcus Faecium NOT DETECTED NOT DETECTED Final   Listeria monocytogenes NOT DETECTED NOT DETECTED Final   Staphylococcus species DETECTED (A) NOT DETECTED Final    Comment: CRITICAL RESULT CALLED TO, READ BACK BY AND VERIFIED WITH: ABBY ELLINGTON @1438  10/26/21 SCS    Staphylococcus aureus (BCID) NOT DETECTED NOT DETECTED Final   Staphylococcus epidermidis DETECTED (A) NOT DETECTED Final    Comment: Methicillin (oxacillin) resistant coagulase negative staphylococcus. Possible blood culture contaminant (unless isolated from more than one blood culture draw or clinical case suggests pathogenicity). No antibiotic treatment is indicated for blood  culture contaminants. CRITICAL RESULT CALLED TO, READ BACK BY AND VERIFIED WITH: ABBY ELLINGTON @1438  10/26/21 SCS     Staphylococcus lugdunensis NOT DETECTED NOT DETECTED Final   Streptococcus species NOT DETECTED NOT DETECTED Final   Streptococcus agalactiae NOT DETECTED NOT DETECTED Final   Streptococcus pneumoniae NOT DETECTED NOT DETECTED Final   Streptococcus pyogenes NOT DETECTED NOT DETECTED Final   A.calcoaceticus-baumannii NOT DETECTED NOT DETECTED Final   Bacteroides fragilis NOT DETECTED NOT DETECTED Final   Enterobacterales NOT DETECTED NOT DETECTED Final   Enterobacter cloacae complex NOT DETECTED NOT DETECTED Final   Escherichia coli NOT DETECTED NOT DETECTED Final   Klebsiella aerogenes NOT DETECTED NOT DETECTED Final   Klebsiella oxytoca NOT DETECTED NOT DETECTED Final   Klebsiella pneumoniae NOT DETECTED NOT DETECTED Final   Proteus  species NOT DETECTED NOT DETECTED Final   Salmonella species NOT DETECTED NOT DETECTED Final   Serratia marcescens NOT DETECTED NOT DETECTED Final   Haemophilus influenzae NOT DETECTED NOT DETECTED Final   Neisseria meningitidis NOT DETECTED NOT DETECTED Final   Pseudomonas aeruginosa NOT DETECTED NOT DETECTED Final   Stenotrophomonas maltophilia NOT DETECTED NOT DETECTED Final   Candida albicans NOT DETECTED NOT DETECTED Final   Candida auris NOT DETECTED NOT DETECTED Final   Candida glabrata NOT DETECTED NOT DETECTED Final   Candida krusei NOT DETECTED NOT DETECTED Final   Candida parapsilosis NOT DETECTED NOT DETECTED Final   Candida tropicalis NOT DETECTED NOT DETECTED Final   Cryptococcus neoformans/gattii NOT DETECTED NOT DETECTED Final   Methicillin resistance mecA/C DETECTED (A) NOT DETECTED Final    Comment: CRITICAL RESULT CALLED TO, READ BACK BY AND VERIFIED WITH: ABBY ELLINGTON @1438  10/26/21 SCS Performed at [facility], [address] [place], [place] [address]   Culture, blood (Routine X 2) w Reflex to ID Panel     Status: None (Preliminary result)   Collection Time: 10/26/21 12:49 AM   Specimen: BLOOD  Result Value Ref Range  Status   Specimen Description BLOOD RIGHT HAND  Final   Special Requests   Final    BOTTLES DRAWN AEROBIC ONLY Blood Culture results may not be optimal due to an inadequate volume of blood received in culture bottles   Culture   Final    NO GROWTH < 24 HOURS Performed at [facility], [address] [place], [place] [address]    Report Status PENDING  Incomplete         Radiology Studies: DG Chest 2 View  Result Date: 10/25/2021 CLINICAL DATA:  Weakness, fell EXAM: CHEST - 2 VIEW COMPARISON:  03/31/2021 FINDINGS: Frontal and lateral views of the chest demonstrate a stable cardiac silhouette. No acute airspace disease, effusion, or pneumothorax. No acute bony abnormalities. IMPRESSION: 1. Stable chest, no acute process. Electronically Signed   By: Randa Ngo M.D.   On: 10/25/2021 20:13   CT HEAD WO CONTRAST (5MM)  Result Date: 10/25/2021 CLINICAL DATA:  Head trauma, minor (Age >= 65y) fall EXAM: CT HEAD WITHOUT CONTRAST TECHNIQUE: Contiguous axial images were obtained from the base of the skull through the vertex without intravenous contrast. RADIATION DOSE REDUCTION: This exam was performed according to the departmental dose-optimization program which includes automated exposure control, adjustment of the mA and/or kV according to patient size and/or use of iterative reconstruction technique. COMPARISON:  03/31/2021 FINDINGS: Brain: Old left basal ganglia and periventricular lacunar infarct. There is atrophy and chronic small vessel disease changes. No acute intracranial abnormality. Specifically, no hemorrhage, hydrocephalus, mass lesion, acute infarction, or significant intracranial injury. Vascular: No hyperdense vessel or unexpected calcification. Skull: No acute calvarial abnormality. Sinuses/Orbits: No acute findings Other: None IMPRESSION: Old left basal ganglia lacunar infarct, stable. Atrophy, chronic microvascular disease. No acute intracranial abnormality. Electronically  Signed   By: Rolm Baptise M.D.   On: 10/25/2021 19:55   DG Foot Complete Left  Result Date: 10/25/2021 CLINICAL DATA:  Left heel wound EXAM: LEFT FOOT - COMPLETE 3+ VIEW COMPARISON:  None. FINDINGS: No acute bony abnormality. Specifically, no fracture, subluxation, or dislocation. No bone destruction to suggest osteomyelitis. No soft tissue gas or radiopaque foreign body. IMPRESSION: No acute bony abnormality. Electronically Signed   By: Rolm Baptise M.D.   On: 10/25/2021 22:06        Scheduled Meds:  clopidogrel  75 mg Oral Daily   DULoxetine  60 mg Oral Daily   gabapentin  600 mg Oral BID   insulin aspart  0-15 Units Subcutaneous TID WC   insulin aspart  0-5 Units Subcutaneous QHS   insulin glargine-yfgn  20 Units Subcutaneous QHS   levothyroxine  50 mcg Oral Q0600   melatonin  5 mg Oral QHS   pantoprazole  40 mg Oral Q breakfast   pramipexole  1 mg Oral q1600   tamsulosin  0.4 mg Oral QPC breakfast   Continuous Infusions:  sodium chloride 75 mL/hr at 10/26/21 0927   cefTRIAXone (ROCEPHIN)  IV 2 g (10/26/21 2313)   vancomycin 1,250 mg (10/27/21 0024)     LOS: 2 days    Time spent: 32 mins     Wyvonnia Dusky, MD [facility] Pager [phone]  If 7PM-7AM, please contact night-coverage 10/27/2021, 7:57 AM

## 2021-10-27 NOTE — Progress Notes (Signed)
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  Pharmacy has been consulted for Vancomycin dosing.  -f/u Blood cx, urine cx  Plan: Scr 1.36>1.50 Adjust Vancomycin from 1250 mg to 1000 mg IV Q 24 hrs.  Goal AUC 400-550. Expected AUC: 492 SCr used: 1.50 Cmin 11.5  Pharmacy will continue to follow and will adjust abx dosing whenever warranted.   Height: 5\' 8"  (172.7 cm) Weight: 88.9 kg (196 lb) IBW/kg (Calculated) : 68.4  Temp (24hrs), Avg:98.2 F (36.8 C), Min:97.3 F (36.3 C), Max:99.1 F (37.3 C)  Recent Labs  Lab 10/25/21 1705 10/25/21 2232 10/26/21 0049 10/26/21 0828 10/27/21 0326  WBC 12.4*  --   --   --  13.9*  CREATININE 1.35*  --   --  1.36* 1.50*  LATICACIDVEN  --  1.4 1.8  --   --      Estimated Creatinine Clearance: 36.9 mL/min (A) (by C-G formula based on SCr of 1.5 mg/dL (H)).    Allergies  Allergen Reactions   Dulaglutide     Other reaction(s): upset stomach Other reaction(s): Abdominal Pain, upset stomach Other reaction(s): upset stomach    Empagliflozin Other (See Comments)    Other reaction(s): yeast infection Other reaction(s): Other (See Comments) Other reaction(s): yeast infection    Lisinopril Cough    tachycardia   Nexium [Esomeprazole] Other (See Comments)    Headache   Oxycodone Other (See Comments)    Even 1/2 tablet of 5mg  caused increased confusion - contributory to hospitalization 10/2020 with AMS   Pregabalin Other (See Comments) and Nausea Only    Other reaction(s): MALAISE, SLIGHT NAUSEA, SLIGHT HEADACHE Other reaction(s): MALAISE, SLIGHT NAUSEA, SLIGHT HEADACHE    Rosuvastatin Other (See Comments)    Other reaction(s): muscle pain 2.16.2022 Pt is current on this medication without any issues. Other reaction(s): muscle pain 2.16.2022 Pt is current on this medication without any issues. Other reaction(s): muscle pain    Antimicrobials this admission: 2/7  Clindamycin  x 1 dose 2/8 Ceftriaxone >> x 7 days 2/8 Vancomycin >>   Microbiology results: 2/07 BCx: 2 of 4 (same set)= GPC, BCID:MRSE 2/9 Bcx pending 2/8 Ucx ordered 2/9 MRSA PCR+   Thank you for allowing pharmacy to be a part of this patients care.  Chinita Greenland PharmD Clinical Pharmacist 10/27/2021

## 2021-10-27 NOTE — Evaluation (Signed)
Physical Therapy Evaluation Patient Details Name: James BLUBAUGH, MD MRN: [id number] DOB: 10-28-1930 Today's Date: 10/27/2021  History of Present Illness  Pt is an 88 y.o. male presenting to hospital 2/7 with c/o weakness and fall (was weak anfd fell into the wall and slid to floor).  Pt admitted with postural dizziness with presyncope, cellulitis of L foot, infected abrasion of L heel (friction burn secondary to RLS), diarrhea, and frequent UTI.  PMH includes h/o CVA with R sided hemiplegia, CKD, BPH, anemia, HLD, OSA on CPAP, DM, CKD, h/o hiatal hernia, OSA on CPAP, RLS, hearing loss R ear, and CEA.  Clinical Impression  Prior to hospital admission, pt was modified independent ambulating with rollator (limited distances d/t L heel pain); lives at [facility].  Pt requesting to use BSC d/t needing to have bowel movement during session; 2nd assist required for peri-care.  Currently pt is SBA semi-supine to sitting edge of bed (increased effort and time for pt to perform on own); min to mod assist with transfers; and min assist to ambulate 5 feet BSC to recliner with RW (2nd assist for safety).  Part of session PT/OT co-evaluation d/t pt needs.  Pt would benefit from skilled PT to address noted impairments and functional limitations (see below for any additional details).  Upon hospital discharge, pt would benefit from SNF.    Recommendations for follow up therapy are one component of a multi-disciplinary discharge planning process, led by the attending physician.  Recommendations may be updated based on patient status, additional functional criteria and insurance authorization.  Follow Up Recommendations Skilled nursing-short term rehab (<3 hours/day)    Assistance Recommended at Discharge Frequent or constant Supervision/Assistance  Patient can return home with the following  A lot of help with walking and/or transfers;A lot of help with bathing/dressing/bathroom;Assistance with  cooking/housework;Assist for transportation    Equipment Recommendations Rolling walker (2 wheels);BSC/3in1;Wheelchair (measurements PT);Wheelchair cushion (measurements PT);Hospital bed  Recommendations for Other Services  OT consult    Functional Status Assessment Patient has had a recent decline in their functional status and demonstrates the ability to make significant improvements in function in a reasonable and predictable amount of time.     Precautions / Restrictions Precautions Precautions: Fall Restrictions Weight Bearing Restrictions: No      Mobility  Bed Mobility Overal bed mobility: Needs Assistance Bed Mobility: Supine to Sit     Supine to sit: Supervision, HOB elevated     General bed mobility comments: increased effort and time for pt to perform on own; use of bed rail    Transfers Overall transfer level: Needs assistance Equipment used: None, Rolling walker (2 wheels) Transfers: Sit to/from Stand, Bed to chair/wheelchair/BSC Sit to Stand: Min assist, Mod assist (x1 trial from [facility] up to RW) Stand pivot transfers: Mod assist (stand pivot bed to [facility] towards L side)         General transfer comment: vc's for UE/LE positioning and overall technique    Ambulation/Gait Ambulation/Gait assistance: Min assist, +2 safety/equipment Gait Distance (Feet): 5 Feet Assistive device: Rolling walker (2 wheels)   Gait velocity: decreased     General Gait Details: decreased B LE step length/foot clearance/heelstrike; pt placing more weight through L forefoot than L heel; increased effort and time to take steps; intermittent vc's for upright posture  Stairs            Wheelchair Mobility    Modified Rankin (Stroke Patients Only)       Balance Overall  balance assessment: Needs assistance Sitting-balance support: No upper extremity supported, Feet supported Sitting balance-Leahy Scale: Good Sitting balance - Comments: steady sitting reaching within  BOS   Standing balance support: Single extremity supported Standing balance-Leahy Scale: Fair Standing balance comment: steady static standing with at least single UE support                             Pertinent Vitals/Pain Pain Assessment Pain Assessment: 0-10 Pain Score: 4  Pain Location: L heel Pain Descriptors / Indicators: Sore Pain Intervention(s): Limited activity within patient's tolerance, Monitored during session, Repositioned Vitals (HR and O2 on room air) stable and WFL throughout treatment session.    Home Living Family/patient expects to be discharged to:: Assisted living                 Home Equipment: Rollator (4 wheels);Rolling Environmental consultant (2 wheels) (hemi-walker) Additional Comments: [facility]    Prior Function Prior Level of Function : Needs assist       Physical Assist : ADLs (physical)   ADLs (physical): Bathing Mobility Comments: Ambulatory with rollator (limited distances d/t L heel pain)       Hand Dominance        Extremity/Trunk Assessment   Upper Extremity Assessment Upper Extremity Assessment: RUE deficits/detail;LUE deficits/detail RUE Deficits / Details: good R hand grip strength; 3/5 R elbow flexion; R shoulder flexion AROM unable to lift against gravity LUE Deficits / Details: at least 3/5 AROM elbow flexion/extension and shoulder flexion; good L hand grip strength    Lower Extremity Assessment Lower Extremity Assessment: RLE deficits/detail;LLE deficits/detail RLE Deficits / Details: 2+/5 R DF; good R quad set isometric strength; at least 3/5 hip flexion; at least 3/5 knee extension    Cervical / Trunk Assessment Cervical / Trunk Assessment: Normal  Communication   Communication: HOH  Cognition Arousal/Alertness: Awake/alert Behavior During Therapy: WFL for tasks assessed/performed Overall Cognitive Status: Within Functional Limits for tasks assessed                                           General Comments General comments (skin integrity, edema, etc.): L heel pad in place.  Nursing cleared pt for participation in physical therapy.  Pt agreeable to PT session.    Exercises  Transfer and ambulation training   Assessment/Plan    PT Assessment Patient needs continued PT services  PT Problem List Decreased strength;Decreased activity tolerance;Decreased balance;Decreased mobility;Decreased knowledge of use of DME;Decreased knowledge of precautions;Pain;Decreased skin integrity       PT Treatment Interventions DME instruction;Gait training;Functional mobility training;Therapeutic activities;Therapeutic exercise;Balance training;Patient/family education    PT Goals (Current goals can be found in the Care Plan section)  Acute Rehab PT Goals Patient Stated Goal: to improve strength and mobility PT Goal Formulation: With patient Time For Goal Achievement: 11/10/21 Potential to Achieve Goals: Good    Frequency Min 2X/week     Co-evaluation PT/OT/SLP Co-Evaluation/Treatment: Yes Reason for Co-Treatment: For patient/therapist safety;To address functional/ADL transfers PT goals addressed during session: Mobility/safety with mobility;Proper use of DME OT goals addressed during session: ADL's and self-care       AM-PAC PT "6 Clicks" Mobility  Outcome Measure Help needed turning from your back to your side while in a flat bed without using bedrails?: None Help needed moving from lying on your back  to sitting on the side of a flat bed without using bedrails?: A Little Help needed moving to and from a bed to a chair (including a wheelchair)?: A Lot Help needed standing up from a chair using your arms (e.g., wheelchair or bedside chair)?: A Lot Help needed to walk in hospital room?: Total Help needed climbing 3-5 steps with a railing? : Total 6 Click Score: 13    End of Session Equipment Utilized During Treatment: Gait belt Activity Tolerance: Patient limited by  fatigue Patient left: in chair;with call bell/phone within reach;with chair alarm set;Other (comment) (B heels floating via pillow support) Nurse Communication: Mobility status;Precautions;Weight bearing status (pt needing new male purewick placed (fell off when pt stood up); also pt with stool sample) PT Visit Diagnosis: Other abnormalities of gait and mobility (R26.89);Muscle weakness (generalized) (M62.81);Difficulty in walking, not elsewhere classified (R26.2)    Time: 2482-5003 PT Time Calculation (min) (ACUTE ONLY): 49 min   Charges:   PT Evaluation $PT Eval Low Complexity: 1 Low PT Treatments $Therapeutic Activity: 23-37 mins       Nyah Shepherd, PT 10/27/21, 11:04 AM

## 2021-10-27 NOTE — TOC Progression Note (Signed)
Transition of Care [facility]) - Progression Note    Patient Details  Name: James DUBIE, James Bell MRN: [id number] Date of Birth: 04-Jun-1931  [facility] CM/SW [place], RN Phone Number: 10/27/2021, 3:22 PM  Clinical Narrative:    Met with the patient to discuss DC plan and needs He lives at [facility], he is agreeable to go to [facility] SNF, and would like [facility] I sent the referral to [facility] completed, PASSr obtained        Expected Discharge Plan and Services     Discharge Planning Services: CM Consult   Living arrangements for the past 2 months: [place]                                       Social Determinants of Health (SDOH) Interventions    Readmission Risk Interventions No flowsheet data found.

## 2021-10-27 NOTE — NC FL2 (Signed)
[place] LEVEL OF CARE SCREENING TOOL     IDENTIFICATION  Patient Name: James Spurr, MD Birthdate: April 18, 1931 Sex: male Admission Date (Current Location): 10/25/2021  [facility] and [place] Number:  Engineering geologist and Address:  [facility], [address], [place], [place] [address]      Provider Number: [id number]  Attending Physician Name and Address:  Wyvonnia Dusky, MD  Relative Name and Phone Number:  Vonna Kotyk [phone]    Current Level of Care: Hospital Recommended Level of Care: [place] Prior Approval Number:    Date Approved/Denied:   PASRR Number: [id number]  Discharge Plan: SNF    Current Diagnoses: Patient Active Problem List   Diagnosis Date Noted   Diarrhea 10/26/2021   Postural dizziness with presyncope 10/25/2021   Abrasion of heel, infected, left, initial encounter 10/25/2021   Cellulitis of left foot 10/25/2021   General weakness 05/12/2021   Right medial knee pain 05/12/2021   Frequent UTI 03/31/2021   Polypharmacy 03/10/2021   Acute encephalopathy 11/02/2020   Healthcare maintenance 06/01/2020   Involuntary movements 04/25/2020   Post-nasal drainage 07/16/2019   Recurrent productive cough 05/31/2019   Hx of basal cell carcinoma 07/10/2017   Rosacea    Gait disturbance, post-stroke 10/05/2015   Presbycusis of both ears    Advanced care planning/counseling discussion 06/23/2015   Impaired mobility 04/23/2015   Hearing loss of right ear due to cerumen impaction 03/23/2015   HTN (hypertension) 50/05/3817   Diastolic dysfunction 29/93/7169   Spastic hemiparesis affecting dominant side ([place]) 07/07/2014   Chest pressure 02/03/2014   Medicare annual wellness visit, subsequent 11/06/2013   Erectile dysfunction due to arterial insufficiency 02/05/2013   MDD (major depressive disorder), recurrent episode, moderate (HCC)    Palpitations 06/26/2012   Hyperlipidemia  associated with type 2 diabetes mellitus (HCC)    Restless leg syndrome, severe    GERD (gastroesophageal reflux disease)    BPH with obstruction/lower urinary tract symptoms    History of CVA (cerebrovascular accident) 03/11/2012   Hemiparesis affecting right side as late effect of cerebrovascular accident (CVA) ([place]) 03/11/2012   Uncontrolled type 2 diabetes mellitus with hyperglycemia, with long-term current use of insulin ([place]) 03/11/2012   OSA on CPAP 03/11/2012   Stage 3a chronic kidney disease ([place]) 03/11/2012   Migraines 03/11/2012   Thrombocytopenia ([place]) 2011   Cause of injury, fall 2011    Orientation RESPIRATION BLADDER Height & Weight     Self, Time, Situation, Place  Normal Continent Weight: 88.9 kg Height:  5\' 8"  (172.7 cm)  BEHAVIORAL SYMPTOMS/MOOD NEUROLOGICAL BOWEL NUTRITION STATUS      Continent Diet (regular)  AMBULATORY STATUS COMMUNICATION OF NEEDS Skin   Extensive Assist Verbally Normal                       Personal Care Assistance Level of Assistance  Bathing, Dressing Bathing Assistance: Limited assistance   Dressing Assistance: Limited assistance     Functional Limitations Info             SPECIAL CARE FACTORS FREQUENCY  PT (By licensed PT)     PT Frequency: 5 times per week              Contractures Contractures Info: Not present    Additional Factors Info  Code Status, Allergies Code Status Info: full code Allergies Info: Dulaglutide, Empagliflozin, Lisinopril, Nexium (Esomeprazole), Oxycodone, Pregabalin, Rosuvastatin  Current Medications (10/27/2021):  This is the current hospital active medication list Current Facility-Administered Medications  Medication Dose Route Frequency Provider Last Rate Last Admin   0.9 %  sodium chloride infusion   Intravenous Continuous Wyvonnia Dusky, MD 75 mL/hr at 10/26/21 0927 New Bag at 10/26/21 8676   acetaminophen (TYLENOL) tablet 650 mg  650 mg Oral Q6H PRN Athena Masse, MD       Or   acetaminophen (TYLENOL) suppository 650 mg  650 mg Rectal Q6H PRN Athena Masse, MD       cefTRIAXone (ROCEPHIN) 2 g in sodium chloride 0.9 % 100 mL IVPB  2 g Intravenous Q24H Judd Gaudier V, MD 200 mL/hr at 10/26/21 2313 2 g at 10/26/21 2313   clopidogrel (PLAVIX) tablet 75 mg  75 mg Oral Daily Athena Masse, MD   75 mg at 10/27/21 0930   doxycycline (VIBRAMYCIN) 100 mg in sodium chloride 0.9 % 250 mL IVPB  100 mg Intravenous Q12H Wyvonnia Dusky, MD       DULoxetine (CYMBALTA) DR capsule 60 mg  60 mg Oral Daily Wyvonnia Dusky, MD   60 mg at 10/27/21 0930   gabapentin (NEURONTIN) capsule 600 mg  600 mg Oral BID Renda Rolls, RPH   600 mg at 10/27/21 0930   And   gabapentin (NEURONTIN) capsule 600 mg  600 mg Oral Daily PRN Renda Rolls, RPH       insulin aspart (novoLOG) injection 0-15 Units  0-15 Units Subcutaneous TID WC Athena Masse, MD   2 Units at 10/27/21 1303   insulin aspart (novoLOG) injection 0-5 Units  0-5 Units Subcutaneous QHS Athena Masse, MD       insulin glargine-yfgn [facility]) injection 20 Units  20 Units Subcutaneous QHS Athena Masse, MD   20 Units at 10/26/21 2118   levothyroxine (SYNTHROID) tablet 50 mcg  50 mcg Oral Q0600 Wyvonnia Dusky, MD   50 mcg at 10/27/21 0545   melatonin tablet 5 mg  5 mg Oral QHS Judd Gaudier V, MD   5 mg at 10/26/21 2118   ondansetron (ZOFRAN) tablet 4 mg  4 mg Oral Q6H PRN Athena Masse, MD       Or   ondansetron [facility]) injection 4 mg  4 mg Intravenous Q6H PRN Athena Masse, MD       pantoprazole (PROTONIX) EC tablet 40 mg  40 mg Oral Q breakfast Judd Gaudier V, MD   40 mg at 10/27/21 0930   pramipexole (MIRAPEX) tablet 1 mg  1 mg Oral q1600 Athena Masse, MD   1 mg at 10/27/21 1517   tamsulosin (FLOMAX) capsule 0.4 mg  0.4 mg Oral QPC breakfast Athena Masse, MD   0.4 mg at 10/27/21 0930     Discharge Medications: Please see discharge summary for a list of discharge  medications.  Relevant Imaging Results:  Relevant Lab Results:   Additional Information SSN: [id number]  Conception Oms, RN

## 2021-10-27 NOTE — Evaluation (Signed)
Occupational Therapy Evaluation Patient Details Name: James COLSTON, MD MRN: [id number] DOB: [birth year] Today's Date: 10/27/2021   History of Present Illness Pt is an 88 y.o. male presenting to hospital 2/7 with c/o weakness and fall (was weak anfd fell into the wall and slid to floor).  Pt admitted with postural dizziness with presyncope, cellulitis of L foot, infected abrasion of L heel (friction burn secondary to RLS), diarrhea, and frequent UTI.  PMH includes h/o CVA with R sided hemiplegia, CKD, BPH, anemia, HLD, OSA on CPAP, DM, CKD, h/o hiatal hernia, OSA on CPAP, RLS, hearing loss R ear, and CEA.   Clinical Impression   Mr Dorantes was seen for OT/PT co-evaluation this date. Prior to hospital admission, pt was MOD I using 4WW with assistance for bathing. Pt lives at [facility]. Pt presents to acute OT demonstrating impaired ADL performance and functional mobility 2/2 decreased activity tolerance and functional strength/ROM/balance deficits. Pt currently requires MAX A x2 + RW perihygiene in standing. MOD A +RW for BSC t/f, +2 for lines mgmt. MAX A for LB access at EOB.  Pt would benefit from skilled OT to address noted impairments and functional limitations (see below for any additional details). Upon hospital discharge, recommend STR to maximize pt safety and return to PLOF.       Recommendations for follow up therapy are one component of a multi-disciplinary discharge planning process, led by the attending physician.  Recommendations may be updated based on patient status, additional functional criteria and insurance authorization.   Follow Up Recommendations  Skilled nursing-short term rehab (<3 hours/day)    Assistance Recommended at Discharge Frequent or constant Supervision/Assistance  Patient can return home with the following A lot of help with walking and/or transfers;A lot of help with bathing/dressing/bathroom;Help with stairs or ramp for entrance;Assistance with  cooking/housework    Functional Status Assessment  Patient has had a recent decline in their functional status and demonstrates the ability to make significant improvements in function in a reasonable and predictable amount of time.  Equipment Recommendations  BSC/3in1    Recommendations for Other Services       Precautions / Restrictions Precautions Precautions: Fall Restrictions Weight Bearing Restrictions: No      Mobility Bed Mobility               General bed mobility comments: received and left sitting    Transfers Overall transfer level: Needs assistance Equipment used: Rolling walker (2 wheels) Transfers: Sit to/from Stand, Bed to chair/wheelchair/BSC Sit to Stand: Mod assist     Step pivot transfers: Mod assist            Balance Overall balance assessment: Needs assistance Sitting-balance support: No upper extremity supported, Feet supported Sitting balance-Leahy Scale: Good     Standing balance support: Single extremity supported Standing balance-Leahy Scale: Fair Standing balance comment: steady static standing with at least single UE support                           ADL either performed or assessed with clinical judgement   ADL Overall ADL's : Needs assistance/impaired                                       General ADL Comments: MAX A x2 + RW perihygiene in standing. MOD A +RW for BSC t/f, +2 for lines  mgmt. MAX A for LB access at EOB      Pertinent Vitals/Pain Pain Assessment Pain Assessment: 0-10 Pain Score: 4  Pain Location: L heel Pain Descriptors / Indicators: Sore Pain Intervention(s): Limited activity within patient's tolerance, Repositioned     Hand Dominance     Extremity/Trunk Assessment Upper Extremity Assessment Upper Extremity Assessment: Generalized weakness RUE Deficits / Details: good R hand grip strength; 3/5 R elbow flexion; R shoulder flexion AROM unable to lift against  gravity LUE Deficits / Details: at least 3/5 AROM elbow flexion/extension and shoulder flexion; good L hand grip strength   Lower Extremity Assessment Lower Extremity Assessment: Generalized weakness RLE Deficits / Details: 2+/5 R DF; good R quad set isometric strength; at least 3/5 hip flexion; at least 3/5 knee extension   Cervical / Trunk Assessment Cervical / Trunk Assessment: Normal   Communication Communication Communication: HOH   Cognition Arousal/Alertness: Awake/alert Behavior During Therapy: WFL for tasks assessed/performed Overall Cognitive Status: Within Functional Limits for tasks assessed                                       General Comments  L heel pad in place            Home Living Family/patient expects to be discharged to:: Assisted living                             Home Equipment: Rollator (4 wheels);Rolling Walker (2 wheels);Other (comment) (hemi walker)   Additional Comments: [facility]      Prior Functioning/Environment Prior Level of Function : Needs assist       Physical Assist : ADLs (physical)   ADLs (physical): Bathing Mobility Comments: Ambulatory with rollator (limited distances d/t L heel pain)          OT Problem List: Decreased strength;Decreased range of motion;Decreased activity tolerance;Impaired balance (sitting and/or standing);Decreased safety awareness      OT Treatment/Interventions: Self-care/ADL training;Therapeutic exercise;Energy conservation;DME and/or AE instruction;Therapeutic activities;Patient/family education;Balance training    OT Goals(Current goals can be found in the care plan section) Acute Rehab OT Goals Patient Stated Goal: to return to PLOF OT Goal Formulation: With patient Time For Goal Achievement: 11/10/21 Potential to Achieve Goals: Good ADL Goals Pt Will Perform Grooming: with min guard assist;standing Pt Will Perform Lower Body Dressing: with min assist;sit  to/from stand Pt Will Transfer to Toilet: with supervision;ambulating;bedside commode Pt Will Perform Toileting - Clothing Manipulation and hygiene: with min guard assist;sit to/from stand  OT Frequency: Min 2X/week    Co-evaluation PT/OT/SLP Co-Evaluation/Treatment: Yes Reason for Co-Treatment: For patient/therapist safety;To address functional/ADL transfers PT goals addressed during session: Mobility/safety with mobility OT goals addressed during session: ADL's and self-care      AM-PAC OT "6 Clicks" Daily Activity     Outcome Measure Help from another person eating meals?: A Little Help from another person taking care of personal grooming?: A Little Help from another person toileting, which includes using toliet, bedpan, or urinal?: A Lot Help from another person bathing (including washing, rinsing, drying)?: A Lot Help from another person to put on and taking off regular upper body clothing?: A Little Help from another person to put on and taking off regular lower body clothing?: A Lot 6 Click Score: 15   End of Session Equipment Utilized During Treatment: Rolling walker (2  wheels);Gait belt Nurse Communication: Mobility status  Activity Tolerance: Patient tolerated treatment well Patient left: in chair;with call bell/phone within reach  OT Visit Diagnosis: Other abnormalities of gait and mobility (R26.89);Muscle weakness (generalized) (M62.81)                Time: 3559-7416 OT Time Calculation (min): 20 min Charges:  OT General Charges $OT Visit: 1 Visit OT Evaluation $OT Eval Low Complexity: 1 Low  Dessie Coma, M.S. OTR/L  10/27/21, 1:17 PM  ascom [phone]

## 2021-10-27 NOTE — TOC Progression Note (Addendum)
Transition of Care [facility]) - Progression Note    Patient Details  Name: James ALMEDA, MD MRN: [id number] Date of Birth: [birth year]  [facility] CM/SW [place], RN Phone Number: 10/27/2021, 3:33 PM  Clinical Narrative:   Patient to go to [facility], Ins approval needed, [place] have to be negative May DC Monday, Will need Ins approval to DC        Expected Discharge Plan and Services     Discharge Planning Services: CM Consult   Living arrangements for the past 2 months: Assisted Living Facility                                       Social Determinants of Health (SDOH) Interventions    Readmission Risk Interventions No flowsheet data found.

## 2021-10-27 NOTE — Telephone Encounter (Signed)
[place] Night - Client TELEPHONE ADVICE RECORD Access[facility]urse Patient Name: James Bell Gender: Male DOB: 07-10-1931 Age: 88 Y 3 M 25 D Return Phone Number: [phone] (Primary) Address: City/ State/ Zip: [name]  [address] Client [place] Night - Client Client Site [place] Provider [name] - MD Contact Type Call Who Is Calling Patient / Member / Family / Caregiver Call Type Triage / Clinical Caller Name Joen Laura Relationship To Patient Care Giver Return Phone Number [phone] (Primary) Chief Complaint Paging or Request for Consult Reason for Call Request to Speak to a Physician Initial Comment Caller states she is a nurse calling from [facility] and need a verbal order for home health for a mutual pt. Caller states he has a sore on his leg. Translation No Disp. Time Eilene Ghazi Time) Disposition Final User 10/26/2021 9:27:02 AM Attempt made - message left Deaton, RN, Levada Dy 10/26/2021 9:42:03 AM FINAL ATTEMPT MADE - message left Yes Deaton, RN, [name]

## 2021-10-27 NOTE — Telephone Encounter (Signed)
Was admitted to [facility] on 10/26/21. Sending note to Dr Darnell Level and Lattie Haw CMA.

## 2021-10-27 NOTE — Consult Note (Signed)
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  Complains of some soreness but not severe.  Pt has silicone heel pad in place upon evaluation today.  Past Medical History:  Diagnosis Date   Anemia    Basal cell carcinoma 2018   R temple    Basal cell carcinoma of nose 2002   bridge of nose   BPH with obstruction/lower urinary tract symptoms    failed flomax, uroxatral, myrbetriq - sees urology [name]) pending videourodynamics   CKD (chronic kidney disease) stage 3, GFR 30-59 ml/min ([name] 03/11/2012   Renal US 02/2014 - multiple kidney cysts    CVA (cerebral infarction) 03/07/12   slurred speech; right hemplegia, left handed - completed PT 07/2014 (17 sessions)   Depression    mild, on cymbalta per prior PCP   Erectile dysfunction    per urology (prostaglandin injection)   GERD (gastroesophageal reflux disease)    with sliding HH   H/O hiatal hernia    History of kidney problems 12/2010   lacerated left kidney after fall   HLD (hyperlipidemia)    Hx of basal cell carcinoma 07/10/2017   R. lat. forehead near hair line   Kidney cysts 02/2014   bilateral (renal US)   Migraines 03/11/12   now rare   OSA on CPAP    sleep study 01/2014 - severe, CPAP at 10, previously on nuvigil (Dr. [name])   Palpitations 01/2012   holter WNL, rare PAC/PVCs   Presbycusis of both ears 2016   rec amplification [facility])   Restless leg syndrome    well controlled on mirapex   Rosacea    [name]   Stroke [facility])    TIA (transient ischemic attack)    Type 2 diabetes, controlled, with neuropathy ([name] 2011   [name] in [facility]   Past Surgical History:  Procedure Laterality Date   ABI  2007   WNL   CARDIAC CATHETERIZATION  2004   normal; [place]   CARDIOVASCULAR STRESS TEST  02/2014   WNL, low risk scan, EF 68%   CAROTID  ENDARTERECTOMY     CATARACT EXTRACTION W/ INTRAOCULAR LENS IMPLANT  11//2012 - 08/2011   COLONOSCOPY  03/2011   poor prep, unable to biopsy polyp in tic fold, rpt 3 mo [name])   COLONOSCOPY  06/2011   mult polyps adenomatous, severe diverticulosis, rpt 3 yrs [name])   CYSTOSCOPY WITH URETHRAL DILATATION N/A 07/24/2013   Procedure: CYSTOSCOPY WITH URETHRAL DILATATION;  Surgeon: [name], MD;  Location: [facility];  Service: Urology;  Laterality: N/A;   ESOPHAGOGASTRODUODENOSCOPY  2006   duodenitis, medual HH, Grade 2 reflux, mild gastritis   ESOPHAGOGASTRODUODENOSCOPY  07/2011   mild chronic gastritis   HEMORRHOID SURGERY  1991   INGUINAL HERNIA REPAIR  1981; 2002   left, then repaired   MRI brain  04/2010   WNL   PROSTATE SURGERY  02/2009   PVP (laser)   SKIN CANCER EXCISION  2002   bridge of nose, BCC   TONSILLECTOMY AND ADENOIDECTOMY  ~ [name] 07/24/2013   Procedure: TRANSURETHRAL INCISION OF THE PROSTATE (TUIP);  Surgeon: [name], MD;  Location: [facility];  Service: Urology;  Laterality: N/A;   URETHRAL DILATION  2014   [place]   Social History  Socioeconomic History   Marital status: Divorced    Spouse name: Not on file   Number of children: 2   Years of education: MD   Highest education level: Not on file  Occupational History   Occupation: retired    Fish farm manager: RETIRED  Tobacco Use   Smoking status: Never   Smokeless tobacco: Never   Tobacco comments:    "used to smoke pipe when I was youngerCustomer service manager   Vaping Use: Never used  Substance and Sexual Activity   Alcohol use: Yes    Alcohol/week: 4.0 standard drinks    Types: 2 Glasses of wine, 2 Shots of liquor per week    Comment: occasional   Drug use: No   Sexual activity: Yes  Other Topics Concern   Not on file  Social History Narrative   Moved into [place] in [place] 12/2020   Caffeine: 1 cup coffee/day     Occupation:  ophthalmologist, retired   Edu: MD   Activity: going to gym 3x/wk with trainer   Diet: some water, fruits/vegetables some, red meat a few times, fish 1x/wk   POA - [name] (Daughter)   Social Determinants of Health   Financial Resource Strain: Low Risk    Difficulty of Paying Living Expenses: Not hard at all  Food Insecurity: Not on file  Transportation Needs: Not on file  Physical Activity: Not on file  Stress: Not on file  Social Connections: Not on file   Family History  Problem Relation Age of Onset   Stroke Father    Lung cancer Father 30        smoker   Alcoholism Sister    Kidney disease Sister    Stroke Brother    Diabetes Brother 44   Lung cancer Brother    Coronary artery disease Neg Hx    Sleep apnea Neg Hx    Allergies  Allergen Reactions   Crestor [Rosuvastatin]     Other reaction(s): muscle pain 2.16.2022 Pt is current on this medication without any issues.   Lisinopril Cough    tachycardia   Nexium [Esomeprazole] Other (See Comments)    Headache   Oxycodone Other (See Comments)    Even 1/2 tablet of 5mg  caused increased confusion - contributory to hospitalization 10/2020 with AMS   Prior to Admission medications   Medication Sig Start Date End Date Taking? Authorizing Provider  Alpha-Lipoic Acid 600 MG CAPS Take 1 capsule (600 mg total) by mouth daily. 06/28/21  Yes [name], MD  clopidogrel (PLAVIX) 75 MG tablet TAKE 1 TABLET DAILY 01/11/21  Yes [name], MD  Coenzyme Q10 (COQ10) 150 MG CAPS Take 1 capsule by mouth daily.   Yes [provider]  COMBIGAN 0.2-0.5 % ophthalmic solution  03/25/21  Yes [provider]  DULoxetine (CYMBALTA) 60 MG capsule Take 1 capsule (60 mg total) by mouth daily. 07/27/21  Yes [name], MD  famotidine (PEPCID) 20 MG tablet Take 1 tablet (20 mg total) by mouth at bedtime. Patient taking differently: Take 20 mg by mouth at bedtime as needed. 04/05/21  Yes Hosie Poisson, MD   gabapentin (NEURONTIN) 300 MG capsule Take 600 mg by mouth 3 (three) times daily. 600 mg BID is his minimum dose without having breakthrough symptoms. He is aware 900 mg/day is recommended per kidney function. 11/15/20  Yes [name], MD  insulin aspart (NOVOLOG) 100 UNIT/ML injection CBG 70 - 120: 0 units  CBG 121 - 150:  1 unit  CBG 151 - 200: 2 units  CBG 201 - 250: 3 units  CBG 251 - 300: 5 units  CBG 301 - 350: 7 units  CBG 351 - 400: 9 units 04/05/21  Yes Hosie Poisson, MD  Insulin Pen Needle (ULTICARE MICRO PEN NEEDLES) 31G X 8 MM MISC Use to inject medication daily 07/27/21  Yes [name], MD  levothyroxine (SYNTHROID) 50 MCG tablet Take 1 tablet (50 mcg total) by mouth daily before breakfast. Per endo [name] 12/07/19  Yes [name], MD  melatonin 5 MG TABS Take 5 mg by mouth at bedtime.   Yes [provider]  nitroGLYCERIN (NITROSTAT) 0.4 MG SL tablet DISSOLVE 1 TABLET UNDER THE TONGUE EVERY 5 MINUTES AS NEEDED FOR CHEST PAIN 07/28/16  Yes [name], MD  NOVOLOG FLEXPEN 100 UNIT/ML FlexPen Inject into the skin. 06/27/21  Yes [provider]  polyethylene glycol (MIRALAX / GLYCOLAX) 17 g packet Take 17 g by mouth daily as needed. 04/05/21  Yes Hosie Poisson, MD  pramipexole (MIRAPEX) 1 MG tablet Take 1 tablet (1 mg total) by mouth daily. At 4pm 07/27/21  Yes [name], MD  RABEprazole (ACIPHEX) 20 MG tablet Take 1 tablet (20 mg total) by mouth daily. 07/27/21  Yes [name], MD  sodium chloride (MURO 128) 5 % ophthalmic solution 1 drop as needed for eye irritation.   Yes [provider]  tamsulosin (FLOMAX) 0.4 MG CAPS capsule Take 1 capsule (0.4 mg total) by mouth daily after breakfast. 07/27/21  Yes [name], MD  TRESIBA FLEXTOUCH 200 UNIT/ML FlexTouch Pen Inject 90 Units into the skin at bedtime. 07/06/21  Yes [provider]  vitamin C (ASCORBIC ACID) 500 MG tablet Take 1,000 mg by mouth daily as  needed.   Yes [provider]  VYZULTA 0.024 % SOLN  09/29/21  Yes [provider]  brimonidine (ALPHAGAN) 0.2 % ophthalmic solution Place 1 drop into both eyes 2 (two) times daily. Patient not taking: Reported on 10/26/2021 03/25/21   [provider]  cephALEXin (KEFLEX) 250 MG capsule Take 1 capsule (250 mg total) by mouth 4 (four) times daily. Patient not taking: Reported on 10/25/2021 10/17/21   [name], MD  docusate sodium (COLACE) 100 MG capsule Take 1 capsule (100 mg total) by mouth 2 (two) times daily as needed for mild constipation. Patient not taking: Reported on 10/25/2021 04/05/21   Hosie Poisson, MD  dorzolamide-timolol (COSOPT) 22.3-6.8 MG/ML ophthalmic solution Place 1 drop into both eyes 2 (two) times daily. Patient not taking: Reported on 10/26/2021 12/17/20   [provider]  FARXIGA 5 MG TABS tablet Take 5 mg by mouth daily. Patient not taking: Reported on 10/25/2021 06/28/21   [provider]  Fluocinolone Acetonide 0.01 % OIL  03/25/21   [provider]  insulin glargine (LANTUS) 100 UNIT/ML injection Inject 0.4 mLs (40 Units total) into the skin at bedtime. Patient not taking: Reported on 10/25/2021 04/05/21   Hosie Poisson, MD  ipratropium (ATROVENT) 0.03 % nasal spray  03/25/21   [provider]  l-methylfolate-B6-B12 (METANX) 3-35-2 MG TABS tablet Take 1 tablet by mouth daily. Patient not taking: Reported on 10/25/2021 07/07/20   Swayze, Ava, DO  LUMIGAN 0.01 % SOLN Place 1 drop into the right eye at bedtime. Patient not taking: Reported on 10/25/2021 06/01/20   [name], MD  ofloxacin [facility]) 0.3 % OTIC solution  03/25/21   [provider]  Omega-3 Fatty Acids (Broadus)  1000 MG CAPS Take 1 capsule (1,000 mg total) by mouth in the morning and at bedtime. Patient not taking: Reported on 10/25/2021 06/01/20   [name], MD  ondansetron (ZOFRAN) 4 MG tablet Take 1 tablet (4 mg total) by mouth every 8  (eight) hours as needed for nausea or vomiting. Patient not taking: Reported on 10/26/2021 11/10/20   [name], MD  [facility], 1 MG/DOSE, 4 MG/3ML SOPN Inject into the skin. Patient not taking: Reported on 10/26/2021 06/27/21   [provider]  rosuvastatin (CRESTOR) 40 MG tablet Take 1 tablet (40 mg total) by mouth daily. Patient not taking: Reported on 10/26/2021 07/27/21   [name], MD  senna-docusate (SENOKOT-S) 8.6-50 MG tablet Take 1 tablet by mouth at bedtime. Patient not taking: Reported on 10/26/2021 11/05/20   Florencia Reasons, MD   DG Chest 2 View  Result Date: 10/25/2021 CLINICAL DATA:  Weakness, fell EXAM: CHEST - 2 VIEW COMPARISON:  03/31/2021 FINDINGS: Frontal and lateral views of the chest demonstrate a stable cardiac silhouette. No acute airspace disease, effusion, or pneumothorax. No acute bony abnormalities. IMPRESSION: 1. Stable chest, no acute process. Electronically Signed   By: Randa Ngo M.D.   On: 10/25/2021 20:13   CT HEAD WO CONTRAST (5MM)  Result Date: 10/25/2021 CLINICAL DATA:  Head trauma, minor (Age >= 65y) fall EXAM: CT HEAD WITHOUT CONTRAST TECHNIQUE: Contiguous axial images were obtained from the base of the skull through the vertex without intravenous contrast. RADIATION DOSE REDUCTION: This exam was performed according to the departmental dose-optimization program which includes automated exposure control, adjustment of the mA and/or kV according to patient size and/or use of iterative reconstruction technique. COMPARISON:  03/31/2021 FINDINGS: Brain: Old left basal ganglia and periventricular lacunar infarct. There is atrophy and chronic small vessel disease changes. No acute intracranial abnormality. Specifically, no hemorrhage, hydrocephalus, mass lesion, acute infarction, or significant intracranial injury. Vascular: No hyperdense vessel or unexpected calcification. Skull: No acute calvarial abnormality. Sinuses/Orbits: No acute findings Other: None  IMPRESSION: Old left basal ganglia lacunar infarct, stable. Atrophy, chronic microvascular disease. No acute intracranial abnormality. Electronically Signed   By: Rolm Baptise M.D.   On: 10/25/2021 19:55   DG Foot Complete Left  Result Date: 10/25/2021 CLINICAL DATA:  Left heel wound EXAM: LEFT FOOT - COMPLETE 3+ VIEW COMPARISON:  None. FINDINGS: No acute bony abnormality. Specifically, no fracture, subluxation, or dislocation. No bone destruction to suggest osteomyelitis. No soft tissue gas or radiopaque foreign body. IMPRESSION: No acute bony abnormality. Electronically Signed   By: Rolm Baptise M.D.   On: 10/25/2021 22:06    Positive ROS: All other systems have been reviewed and were otherwise negative with the exception of those mentioned in the HPI and as above.  12 point ROS was performed.  Physical Exam: General: Alert and oriented.  No apparent distress.  Vascular:  Left foot:Dorsalis Pedis:  present Posterior Tibial:  present  Right foot: Dorsalis Pedis:  present Posterior Tibial:  present  Neuro:intact  Derm:Left heel has area of pinkish superficial irritation secondary to recent likely deroofed heel blister.  No breakdown of epidermis  Ortho/MS: Demonstrated intact rom.   Assessment: Recent heel blister  Plan: The skin is intact and no ulceration at this time.  Recommend continued use of heel pad and pressure reducing boots. No s/s infection.  Pad applied by myself today.  No f/u needed at this time unless further breakdown of skin noted.  Will sign off.    Elesa Hacker,  DPM Cell [phone]   10/27/2021 8:19 AM

## 2021-10-27 NOTE — TOC Progression Note (Signed)
Transition of Care [facility]) - Progression Note    Patient Details  Name: James ROMINGER, MD MRN: [id number] Date of Birth: 24-Sep-1930  Transition of Care [facility]) CM/SW [place], RN Phone Number: 10/27/2021, 10:38 AM  Clinical Narrative:    Received a message from [place] from [facility] they are open with the patient for RN, PT, OT , will need order at [place] for home health        Expected Discharge Plan and Services     Discharge Planning Services: CM Consult   Living arrangements for the past 2 months: Assisted Living Facility                                       Social Determinants of Health (SDOH) Interventions    Readmission Risk Interventions No flowsheet data found.

## 2021-10-28 ENCOUNTER — Encounter: Payer: Medicare Other | Admitting: Physical Medicine & Rehabilitation

## 2021-10-28 ENCOUNTER — Other Ambulatory Visit (HOSPITAL_COMMUNITY): Payer: Self-pay

## 2021-10-28 DIAGNOSIS — A0472 Enterocolitis due to Clostridium difficile, not specified as recurrent: Principal | ICD-10-CM

## 2021-10-28 DIAGNOSIS — G2581 Restless legs syndrome: Secondary | ICD-10-CM

## 2021-10-28 LAB — GASTROINTESTINAL PANEL BY PCR, STOOL (REPLACES STOOL CULTURE)

## 2021-10-28 LAB — CULTURE, BLOOD (ROUTINE X 2): Special Requests: ADEQUATE

## 2021-10-28 LAB — CBC
HCT: 38.2 % — ABNORMAL LOW (ref 39.0–52.0)
Hemoglobin: 12.5 g/dL — ABNORMAL LOW (ref 13.0–17.0)
MCH: 29.8 pg (ref 26.0–34.0)
MCHC: 32.7 g/dL (ref 30.0–36.0)
MCV: 91 fL (ref 80.0–100.0)
Platelets: 187 10*3/uL (ref 150–400)
RBC: 4.2 MIL/uL — ABNORMAL LOW (ref 4.22–5.81)
RDW: 13.9 % (ref 11.5–15.5)
WBC: 10.4 10*3/uL (ref 4.0–10.5)
nRBC: 0 % (ref 0.0–0.2)

## 2021-10-28 LAB — BASIC METABOLIC PANEL
Anion gap: 7 (ref 5–15)
BUN: 18 mg/dL (ref 8–23)
CO2: 22 mmol/L (ref 22–32)
Calcium: 7.9 mg/dL — ABNORMAL LOW (ref 8.9–10.3)
Chloride: 106 mmol/L (ref 98–111)
Creatinine, Ser: 1.22 mg/dL (ref 0.61–1.24)
GFR, Estimated: 57 mL/min — ABNORMAL LOW (ref >60)
Glucose, Bld: 93 mg/dL (ref 70–99)
Potassium: 3.7 mmol/L (ref 3.5–5.1)
Sodium: 135 mmol/L (ref 135–145)

## 2021-10-28 LAB — GLUCOSE, CAPILLARY
Glucose-Capillary: 79 mg/dL (ref 70–99)
Glucose-Capillary: 128 mg/dL — ABNORMAL HIGH (ref 70–99)
Glucose-Capillary: 131 mg/dL — ABNORMAL HIGH (ref 70–99)
Glucose-Capillary: 124 mg/dL — ABNORMAL HIGH (ref 70–99)

## 2021-10-28 LAB — C DIFFICILE QUICK SCREEN W PCR REFLEX
C Diff antigen: POSITIVE — AB
C Diff interpretation: DETECTED
C Diff toxin: POSITIVE — AB

## 2021-10-28 MED ORDER — VANCOMYCIN HCL 125 MG PO CAPS
125.0000 mg | ORAL_CAPSULE | Freq: Four times a day (QID) | ORAL | Status: DC
  Administered 2021-10-28 – 2021-11-02 (×22): 125 mg via ORAL
  Filled 2021-10-28 (×24): qty 1

## 2021-10-28 NOTE — Progress Notes (Signed)
Occupational Therapy Treatment Patient Details Name: James SILVERNAIL, MD MRN: [id number] DOB: 09-24-1930 Today's Date: 10/28/2021   History of present illness Pt is an 88 y.o. male presenting to hospital 2/7 with c/o weakness and fall (was weak anfd fell into the wall and slid to floor).  Pt admitted with postural dizziness with presyncope, cellulitis of L foot, infected abrasion of L heel (friction burn secondary to RLS), diarrhea, and frequent UTI.  PMH includes h/o CVA with R sided hemiplegia, CKD, BPH, anemia, HLD, OSA on CPAP, DM, CKD, h/o hiatal hernia, OSA on CPAP, RLS, hearing loss R ear, and CEA.   OT comments  [name] was seen for OT treatment on this date. Upon arrival to room pt reclined in bed having completed tileting with nursing however pt agreeable to tx. Pt requires MIN A exit R side of bed, MOD A + RW sit<>stand. SETUP + SUPERVISION for LB access seated EOC. Pt tolerated seated therex as described below, instructed in HEP including seated situps. Pt making good progress toward goals. Pt continues to benefit from skilled OT services to maximize return to PLOF and minimize risk of future falls, injury, caregiver burden, and readmission. Will continue to follow POC. Discharge recommendation remains appropriate.     Recommendations for follow up therapy are one component of a multi-disciplinary discharge planning process, led by the attending physician.  Recommendations may be updated based on patient status, additional functional criteria and insurance authorization.    Follow Up Recommendations  Skilled nursing-short term rehab (<3 hours/day)    Assistance Recommended at Discharge Frequent or constant Supervision/Assistance  Patient can return home with the following  A lot of help with walking and/or transfers;A lot of help with bathing/dressing/bathroom;Help with stairs or ramp for entrance;Assistance with cooking/housework   Equipment Recommendations  BSC/3in1     Recommendations for Other Services      Precautions / Restrictions Precautions Precautions: Fall Restrictions Weight Bearing Restrictions: No       Mobility Bed Mobility Overal bed mobility: Needs Assistance Bed Mobility: Supine to Sit     Supine to sit: Min assist          Transfers Overall transfer level: Needs assistance Equipment used: Rolling walker (2 wheels) Transfers: Sit to/from Stand, Bed to chair/wheelchair/BSC Sit to Stand: Mod assist     Step pivot transfers: Min assist           Balance Overall balance assessment: Needs assistance Sitting-balance support: No upper extremity supported, Feet supported Sitting balance-Leahy Scale: Good     Standing balance support: Single extremity supported Standing balance-Leahy Scale: Fair                             ADL either performed or assessed with clinical judgement   ADL Overall ADL's : Needs assistance/impaired                                       General ADL Comments: MOD A + RW for simulated BSC t/f. SETUP for LBD seated in chair.      Cognition Arousal/Alertness: Awake/alert Behavior During Therapy: WFL for tasks assessed/performed Overall Cognitive Status: Within Functional Limits for tasks assessed  Pertinent Vitals/ Pain       Pain Assessment Pain Assessment: No/denies pain         Frequency  Min 2X/week        Progress Toward Goals  OT Goals(current goals can now be found in the care plan section)  Progress towards OT goals: Progressing toward goals  Acute Rehab OT Goals Patient Stated Goal: to feel better OT Goal Formulation: With patient Time For Goal Achievement: 11/10/21 Potential to Achieve Goals: Good ADL Goals Pt Will Perform Grooming: with min guard assist;standing Pt Will Perform Lower Body Dressing: with min assist;sit to/from stand Pt Will Transfer to  Toilet: with supervision;ambulating;bedside commode Pt Will Perform Toileting - Clothing Manipulation and hygiene: with min guard assist;sit to/from stand  Plan Discharge plan remains appropriate;Frequency remains appropriate    Co-evaluation                 AM-PAC OT "6 Clicks" Daily Activity     Outcome Measure   Help from another person eating meals?: A Little Help from another person taking care of personal grooming?: A Little Help from another person toileting, which includes using toliet, bedpan, or urinal?: A Lot Help from another person bathing (including washing, rinsing, drying)?: A Lot Help from another person to put on and taking off regular upper body clothing?: A Little Help from another person to put on and taking off regular lower body clothing?: A Little 6 Click Score: 16    End of Session Equipment Utilized During Treatment: Rolling walker (2 wheels);Gait belt  OT Visit Diagnosis: Other abnormalities of gait and mobility (R26.89);Muscle weakness (generalized) (M62.81)   Activity Tolerance Patient tolerated treatment well   Patient Left in chair;with call bell/phone within reach;with chair alarm set   Nurse Communication Mobility status        Time: 6415-8309 OT Time Calculation (min): 26 min  Charges: OT General Charges $OT Visit: 1 Visit OT Treatments $Self Care/Home Management : 8-22 mins $Therapeutic Exercise: 8-22 mins  James Bell, M.S. OTR/L  10/28/21, 10:57 AM  ascom [phone]

## 2021-10-28 NOTE — TOC Progression Note (Signed)
Transition of Care [facility]) - Progression Note    Patient Details  Name: James TANZI, MD MRN: [id number] Date of Birth: 1931-03-11  Transition of Care [facility]) CM/SW [place], RN Phone Number: 10/28/2021, 9:21 AM  Clinical Narrative:   The patient has Pos Cdif, [facility] is not able to accept him with Pos Cdiff, bedsearch sent to other facilities to see if they will accept the patient with Cdiff    Expected Discharge Plan: Skilled Nursing Facility Barriers to Discharge: Continued Medical Work up  Expected Discharge Plan and Services Expected Discharge Plan: [place]   Discharge Planning Services: CM Consult   Living arrangements for the past 2 months: Assisted Living Facility                                       Social Determinants of Health (SDOH) Interventions    Readmission Risk Interventions No flowsheet data found.

## 2021-10-28 NOTE — Progress Notes (Signed)
Stool sample for GI panel by PCR and C-diff screen collected and sent to [facility] during this shift.Nurse later received critical results from [facility] that patient tested positive for C-diff.Pt has enteric precautions order in place.will cont to monitor.

## 2021-10-28 NOTE — TOC Benefit Eligibility Note (Signed)
Patient Teacher, English as a foreign language completed.    The patient is currently admitted and upon discharge could be taking Vancomycin 125 mg Capsules.  The current 10 day co-pay is, $14.00.   The patient is currently admitted and upon discharge could be taking Dificid 200 mg tablets.  The current 10 day co-pay is, $38.00.   The patient is insured through [place] ([place] only [place] that takes [employer])     Lyndel Safe, [place] Patient Advocate Specialist [place] Patient Advocate Team Direct Number: [phone]  Fax: [phone]

## 2021-10-28 NOTE — Plan of Care (Signed)

## 2021-10-28 NOTE — Progress Notes (Signed)
PROGRESS NOTE    James Spurr, MD  QAS:[id number] DOB: August 29, 1931 DOA: 10/25/2021 PCP: [name], MD   Assessment & Plan:   Principal Problem:   Postural dizziness with presyncope Active Problems:   Hemiparesis affecting right side as late effect of cerebrovascular accident (CVA) ([place])   Uncontrolled type 2 diabetes mellitus with hyperglycemia, with long-term current use of insulin (HCC)   Stage 3a chronic kidney disease (HCC)   Restless leg syndrome, severe   HTN (hypertension)   Frequent UTI   Abrasion of heel, infected, left, initial encounter   Cellulitis of left foot   Diarrhea  C. diff: likely cause of the diarrhea. Started on po vanco. D/c all other abxs. Continue on contact/enteric precautions   Leukocytosis: likely secondary to c. Diff. Continue on po vanco   Postural dizziness: w/ presyncope. Etiology unclear. Improved    Cellulitis of left foot: completed abx course. No debridement needed as per podiatry    Left infected abrasion of heel: friction burn left heel secondary to restless leg syndrome with superimposed infection. Completed abx course.   Unlikely bacteremia: blood cx growing stap epi, likely containment. Repeat blood cxs NGTD   DM2: HbA1c 7.2, fairly well controlled. Continue on glargine, SSI w/ accuchecks    Frequent UTIs: last UTI 08/2021 & treated.  Urine cx growing enterobacter approx 30,000 colonies/ml. Completed abx course    Restless leg syndrome: continue on home dose of pramipexole    CKDIIIa: Cr is trending down from day prior. Avoid nephrotoxic meds     Hemiparesis affecting right side: residual effect of CVA. Continue on plavix, statin     DVT prophylaxis: lovenox  Code Status: full  Family Communication: discussed pt's care w/ pt's daughter, Wells Guiles, and answered her questions  Disposition Plan: possibly d/c to SNF  Level of care: Med-Surg  Status is: Inpatient Remains inpatient appropriate because: significant  diarrhea     Consultants:  Podiatry   Procedures:   Antimicrobials: po vanco    Subjective: Pt c/o diarrhea  Objective: Vitals:   10/27/21 1652 10/27/21 1937 10/28/21 0335 10/28/21 0827  BP: 132/60 140/61 104/60 136/73  Pulse: 82 87 71 73  Resp: 18 16 16 16   Temp: 98 F (36.7 C) 99 F (37.2 C) 97.7 F (36.5 C) 97.9 F (36.6 C)  TempSrc:      SpO2: 100% 100% 97% 97%  Weight:      Height:        Intake/Output Summary (Last 24 hours) at 10/28/2021 1433 Last data filed at 10/28/2021 0500 Gross per 24 hour  Intake --  Output 950 ml  Net -950 ml   Filed Weights   10/25/21 1703  Weight: 88.9 kg    Examination:  General exam: appears calm & comfortable  Respiratory system: clear breath sounds b/l  Cardiovascular system: S1 & S2+. No rubs or clicks    Gastrointestinal system: Abd is soft, NT, ND & hyperactive bowel sounds  Central nervous system: alert and oriented. Moves all extremities   Psychiatry: judgement and insight appears normal. Flat mood and affect     Data Reviewed: I have personally reviewed following labs and imaging studies  CBC: Recent Labs  Lab 10/25/21 1705 10/27/21 0326 10/28/21 0619  WBC 12.4* 13.9* 10.4  HGB 13.3 12.4* 12.5*  HCT 40.4 38.3* 38.2*  MCV 92.0 91.0 91.0  PLT 193 178 798   Basic Metabolic Panel: Recent Labs  Lab 10/25/21 1705 10/26/21 0828 10/27/21 0326 10/28/21 0619  NA  133*  --  136 135  K 4.2  --  4.4 3.7  CL 101  --  107 106  CO2 25  --  25 22  GLUCOSE 302*  --  148* 93  BUN 20  --  17 18  CREATININE 1.35* 1.36* 1.50* 1.22  CALCIUM 8.4*  --  7.8* 7.9*   GFR: Estimated Creatinine Clearance: 45.3 mL/min (by C-G formula based on SCr of 1.22 mg/dL). Liver Function Tests: No results for input(s): AST, ALT, ALKPHOS, BILITOT, PROT, ALBUMIN in the last 168 hours. No results for input(s): LIPASE, AMYLASE in the last 168 hours. No results for input(s): AMMONIA in the last 168 hours. Coagulation Profile: No  results for input(s): INR, PROTIME in the last 168 hours. Cardiac Enzymes: No results for input(s): CKTOTAL, CKMB, CKMBINDEX, TROPONINI in the last 168 hours. BNP (last 3 results) No results for input(s): PROBNP in the last 8760 hours. HbA1C: Recent Labs    10/25/21 1705  HGBA1C 7.2*   CBG: Recent Labs  Lab 10/27/21 1130 10/27/21 1648 10/27/21 2249 10/28/21 0820 10/28/21 1243  GLUCAP 139* 142* 154* 79 128*   Lipid Profile: No results for input(s): CHOL, HDL, LDLCALC, TRIG, CHOLHDL, LDLDIRECT in the last 72 hours. Thyroid Function Tests: No results for input(s): TSH, T4TOTAL, FREET4, T3FREE, THYROIDAB in the last 72 hours. Anemia Panel: No results for input(s): VITAMINB12, FOLATE, FERRITIN, TIBC, IRON, RETICCTPCT in the last 72 hours. Sepsis Labs: Recent Labs  Lab 10/25/21 2232 10/26/21 0049  LATICACIDVEN 1.4 1.8    Recent Results (from the past 240 hour(s))  Urine Culture     Status: None   Collection Time: 10/19/21 10:32 AM   Specimen: Urine  Result Value Ref Range Status   MICRO NUMBER: [id number]  Final   SPECIMEN QUALITY: Adequate  Final   Sample Source NOT GIVEN  Final   STATUS: FINAL  Final   Result: No Growth  Final  Resp Panel by RT-PCR (Flu A&B, Covid) Nasopharyngeal Swab     Status: None   Collection Time: 10/25/21 10:32 PM   Specimen: Nasopharyngeal Swab; Nasopharyngeal(NP) swabs in vial transport medium  Result Value Ref Range Status   SARS Coronavirus 2 by RT PCR NEGATIVE NEGATIVE Final    Comment: (NOTE) SARS-CoV-2 target nucleic acids are NOT DETECTED.  The SARS-CoV-2 RNA is generally detectable in upper respiratory specimens during the acute phase of infection. The lowest concentration of SARS-CoV-2 viral copies this assay can detect is 138 copies/mL. A negative result does not preclude SARS-Cov-2 infection and should not be used as the sole basis for treatment or other patient management decisions. A negative result may occur with  improper  specimen collection/handling, submission of specimen other than nasopharyngeal swab, presence of viral mutation(s) within the areas targeted by this assay, and inadequate number of viral copies(<138 copies/mL). A negative result must be combined with clinical observations, patient history, and epidemiological information. The expected result is Negative.  Fact Sheet for Patients:  EntrepreneurPulse.com.au  Fact Sheet for Healthcare Providers:  IncredibleEmployment.be  This test is no t yet approved or cleared by the [place] FDA and  has been authorized for detection and/or diagnosis of SARS-CoV-2 by FDA under an Emergency Use Authorization (EUA). This EUA will remain  in effect (meaning this test can be used) for the duration of the COVID-19 declaration under Section 564(b)(1) of the Act, 21 U.S.C.section 360bbb-3(b)(1), unless the authorization is terminated  or revoked sooner.       Influenza A  by PCR NEGATIVE NEGATIVE Final   Influenza B by PCR NEGATIVE NEGATIVE Final    Comment: (NOTE) The Xpert Xpress SARS-CoV-2/FLU/RSV plus assay is intended as an aid in the diagnosis of influenza from Nasopharyngeal swab specimens and should not be used as a sole basis for treatment. Nasal washings and aspirates are unacceptable for Xpert Xpress SARS-CoV-2/FLU/RSV testing.  Fact Sheet for Patients: EntrepreneurPulse.com.au  Fact Sheet for Healthcare Providers: IncredibleEmployment.be  This test is not yet approved or cleared by the [place] FDA and has been authorized for detection and/or diagnosis of SARS-CoV-2 by FDA under an Emergency Use Authorization (EUA). This EUA will remain in effect (meaning this test can be used) for the duration of the COVID-19 declaration under Section 564(b)(1) of the Act, 21 U.S.C. section 360bbb-3(b)(1), unless the authorization is terminated or revoked.  Performed at  [facility], [address] [place], [place] [address]   Culture, blood (routine x 2)     Status: Abnormal   Collection Time: 10/25/21 10:33 PM   Specimen: BLOOD  Result Value Ref Range Status   Specimen Description   Final    BLOOD LEFT ANTECUBITAL Performed at [facility], [place] [address] [place], [place] [address]    Special Requests   Final    BOTTLES DRAWN AEROBIC AND ANAEROBIC Blood Culture adequate volume Performed at [facility], [address] [place], [place] [address]    Culture  Setup Time   Final    GRAM POSITIVE COCCI Organism ID to follow CRITICAL RESULT CALLED TO, READ BACK BY AND VERIFIED WITH: ABBY ELLINGTON @1438  10/26/21 SCS IN BOTH AEROBIC AND ANAEROBIC BOTTLES Performed at [facility], [address] [place], [place] [address]    Culture (A)  Final    STAPHYLOCOCCUS EPIDERMIDIS THE SIGNIFICANCE OF ISOLATING THIS ORGANISM FROM A SINGLE SET OF BLOOD CULTURES WHEN MULTIPLE SETS ARE DRAWN IS UNCERTAIN. PLEASE NOTIFY THE MICROBIOLOGY DEPARTMENT WITHIN ONE WEEK IF SPECIATION AND SENSITIVITIES ARE REQUIRED. Performed at [facility], [place] [address] [place],  [address]    Report Status 10/28/2021 FINAL  Final  Blood Culture ID Panel (Reflexed)     Status: Abnormal   Collection Time: 10/25/21 10:33 PM  Result Value Ref Range Status   Enterococcus faecalis NOT DETECTED NOT DETECTED Final   Enterococcus Faecium NOT DETECTED NOT DETECTED Final   Listeria monocytogenes NOT DETECTED NOT DETECTED Final   Staphylococcus species DETECTED (A) NOT DETECTED Final    Comment: CRITICAL RESULT CALLED TO, READ BACK BY AND VERIFIED WITH: ABBY ELLINGTON @1438  10/26/21 SCS    Staphylococcus aureus (BCID) NOT DETECTED NOT DETECTED Final   Staphylococcus epidermidis DETECTED (A) NOT DETECTED Final    Comment: Methicillin (oxacillin) resistant coagulase negative staphylococcus. Possible blood culture contaminant (unless isolated from  more than one blood culture draw or clinical case suggests pathogenicity). No antibiotic treatment is indicated for blood  culture contaminants. CRITICAL RESULT CALLED TO, READ BACK BY AND VERIFIED WITH: ABBY ELLINGTON @1438  10/26/21 SCS    Staphylococcus lugdunensis NOT DETECTED NOT DETECTED Final   Streptococcus species NOT DETECTED NOT DETECTED Final   Streptococcus agalactiae NOT DETECTED NOT DETECTED Final   Streptococcus pneumoniae NOT DETECTED NOT DETECTED Final   Streptococcus pyogenes NOT DETECTED NOT DETECTED Final   A.calcoaceticus-baumannii NOT DETECTED NOT DETECTED Final   Bacteroides fragilis NOT DETECTED NOT DETECTED Final   Enterobacterales NOT DETECTED NOT DETECTED Final   Enterobacter cloacae complex NOT DETECTED NOT DETECTED Final  Escherichia coli NOT DETECTED NOT DETECTED Final   Klebsiella aerogenes NOT DETECTED NOT DETECTED Final   Klebsiella oxytoca NOT DETECTED NOT DETECTED Final   Klebsiella pneumoniae NOT DETECTED NOT DETECTED Final   Proteus species NOT DETECTED NOT DETECTED Final   Salmonella species NOT DETECTED NOT DETECTED Final   Serratia marcescens NOT DETECTED NOT DETECTED Final   Haemophilus influenzae NOT DETECTED NOT DETECTED Final   Neisseria meningitidis NOT DETECTED NOT DETECTED Final   Pseudomonas aeruginosa NOT DETECTED NOT DETECTED Final   Stenotrophomonas maltophilia NOT DETECTED NOT DETECTED Final   Candida albicans NOT DETECTED NOT DETECTED Final   Candida auris NOT DETECTED NOT DETECTED Final   Candida glabrata NOT DETECTED NOT DETECTED Final   Candida krusei NOT DETECTED NOT DETECTED Final   Candida parapsilosis NOT DETECTED NOT DETECTED Final   Candida tropicalis NOT DETECTED NOT DETECTED Final   Cryptococcus neoformans/gattii NOT DETECTED NOT DETECTED Final   Methicillin resistance mecA/C DETECTED (A) NOT DETECTED Final    Comment: CRITICAL RESULT CALLED TO, READ BACK BY AND VERIFIED WITH: ABBY ELLINGTON @1438  10/26/21 SCS Performed  at [facility], [address] [place], [place] [address]   Culture, blood (Routine X 2) w Reflex to ID Panel     Status: None (Preliminary result)   Collection Time: 10/26/21 12:49 AM   Specimen: BLOOD  Result Value Ref Range Status   Specimen Description BLOOD RIGHT HAND  Final   Special Requests   Final    BOTTLES DRAWN AEROBIC ONLY Blood Culture results may not be optimal due to an inadequate volume of blood received in culture bottles   Culture   Final    NO GROWTH 2 DAYS Performed at [facility], [address] [place], [place] [address]    Report Status PENDING  Incomplete  Urine Culture     Status: Abnormal (Preliminary result)   Collection Time: 10/26/21  3:31 AM   Specimen: Urine, Random  Result Value Ref Range Status   Specimen Description   Final    URINE, RANDOM Performed at [facility], [address] [place], [place] [address]    Special Requests   Final    NONE Performed at [facility], [address] [place], [place] [address]    Culture (A)  Final    30,000 COLONIES/mL ENTEROBACTER AEROGENES SUSCEPTIBILITIES TO FOLLOW Performed at [facility], [address] [place], [place] [address]    Report Status PENDING  Incomplete  CULTURE, BLOOD (ROUTINE X 2) w Reflex to ID Panel     Status: None (Preliminary result)   Collection Time: 10/27/21  8:16 AM   Specimen: BLOOD RIGHT HAND  Result Value Ref Range Status   Specimen Description BLOOD RIGHT HAND  Final   Special Requests   Final    BOTTLES DRAWN AEROBIC AND ANAEROBIC Blood Culture adequate volume   Culture   Final    NO GROWTH < 24 HOURS Performed at [facility], [address] [place], [place] [address]    Report Status PENDING  Incomplete  CULTURE, BLOOD (ROUTINE X 2) w Reflex to ID Panel     Status: None (Preliminary result)   Collection Time: 10/27/21  8:23 AM   Specimen: BLOOD LEFT HAND  Result Value Ref Range Status   Specimen  Description BLOOD LEFT HAND  Final   Special Requests   Final    BOTTLES DRAWN AEROBIC ONLY Blood Culture results may not be optimal  due to an inadequate volume of blood received in culture bottles   Culture   Final    NO GROWTH < 24 HOURS Performed at [facility], [address] [place], [place] [address]    Report Status PENDING  Incomplete  MRSA Next Gen by PCR, Nasal     Status: Abnormal   Collection Time: 10/27/21  9:42 AM   Specimen: Nasal Mucosa; Nasal Swab  Result Value Ref Range Status   MRSA by PCR Next Gen DETECTED (A) NOT DETECTED Final    Comment: RESULT CALLED TO, READ BACK BY AND VERIFIED WITH: ANGELA HESTER 10/27/21 1055 MW (NOTE) The GeneXpert MRSA Assay (FDA approved for NASAL specimens only), is one component of a comprehensive MRSA colonization surveillance program. It is not intended to diagnose MRSA infection nor to guide or monitor treatment for MRSA infections. Test performance is not FDA approved in patients less than 66 years old. Performed at [facility], [address] [place], [place] [address]   Gastrointestinal Panel by PCR , Stool     Status: None   Collection Time: 10/28/21  5:15 AM   Specimen: Stool  Result Value Ref Range Status   Campylobacter species NOT DETECTED NOT DETECTED Final   Plesimonas shigelloides NOT DETECTED NOT DETECTED Final   Salmonella species NOT DETECTED NOT DETECTED Final   Yersinia enterocolitica NOT DETECTED NOT DETECTED Final   Vibrio species NOT DETECTED NOT DETECTED Final   Vibrio cholerae NOT DETECTED NOT DETECTED Final   Enteroaggregative E coli (EAEC) NOT DETECTED NOT DETECTED Final   Enteropathogenic E coli (EPEC) NOT DETECTED NOT DETECTED Final   Enterotoxigenic E coli (ETEC) NOT DETECTED NOT DETECTED Final   Shiga like toxin producing E coli (STEC) NOT DETECTED NOT DETECTED Final   Shigella/Enteroinvasive E coli (EIEC) NOT DETECTED NOT DETECTED Final   Cryptosporidium NOT DETECTED NOT  DETECTED Final   Cyclospora cayetanensis NOT DETECTED NOT DETECTED Final   Entamoeba histolytica NOT DETECTED NOT DETECTED Final   Giardia lamblia NOT DETECTED NOT DETECTED Final   Adenovirus F40/41 NOT DETECTED NOT DETECTED Final   Astrovirus NOT DETECTED NOT DETECTED Final   Norovirus GI/GII NOT DETECTED NOT DETECTED Final   Rotavirus A NOT DETECTED NOT DETECTED Final   Sapovirus (I, II, IV, and V) NOT DETECTED NOT DETECTED Final    Comment: Performed at [facility], [address] [place], [name] [address]  C Difficile Quick Screen w PCR reflex     Status: Abnormal   Collection Time: 10/28/21  5:15 AM   Specimen: STOOL  Result Value Ref Range Status   C Diff antigen POSITIVE (A) NEGATIVE Final   C Diff toxin POSITIVE (A) NEGATIVE Final   C Diff interpretation Toxin producing C. difficile detected.  Final    Comment: CRITICAL RESULT CALLED TO, READ BACK BY AND VERIFIED WITH: ADEYEMI AJIBADE AT 8185 10/28/21.PMF Performed at [facility], [address], [place],  [address]          Radiology Studies: No results found.      Scheduled Meds:  clopidogrel  75 mg Oral Daily   DULoxetine  60 mg Oral Daily   gabapentin  600 mg Oral BID   insulin aspart  0-15 Units Subcutaneous TID WC   insulin aspart  0-5 Units Subcutaneous QHS   insulin glargine-yfgn  20 Units Subcutaneous QHS   levothyroxine  50 mcg Oral Q0600   melatonin  5 mg Oral QHS   pantoprazole  40 mg Oral Q breakfast   pramipexole  1 mg Oral q1600   tamsulosin  0.4 mg Oral QPC breakfast   vancomycin  125 mg Oral QID   Continuous Infusions:     LOS: 3 days    Time spent: 25 mins     Wyvonnia Dusky, MD [facility] Pager [phone]  If 7PM-7AM, please contact night-coverage 10/28/2021, 2:33 PM

## 2021-10-28 NOTE — Plan of Care (Signed)
°  Problem: Clinical Measurements: Goal: Ability to avoid or minimize complications of infection will improve Outcome: Progressing   Problem: Skin Integrity: Goal: Skin integrity will improve Outcome: Progressing   Problem: Education: Goal: Knowledge of General Education information will improve Description: Including pain rating scale, medication(s)/side effects and non-pharmacologic comfort measures Outcome: Progressing   Problem: Health Behavior/Discharge Planning: Goal: Ability to manage health-related needs will improve Outcome: Progressing   

## 2021-10-29 LAB — URINE CULTURE: Culture: 30000 — AB

## 2021-10-29 LAB — CBC
HCT: 37.9 % — ABNORMAL LOW (ref 39.0–52.0)
Hemoglobin: 12.4 g/dL — ABNORMAL LOW (ref 13.0–17.0)
MCH: 29.6 pg (ref 26.0–34.0)
MCHC: 32.7 g/dL (ref 30.0–36.0)
MCV: 90.5 fL (ref 80.0–100.0)
Platelets: 229 10*3/uL (ref 150–400)
RBC: 4.19 MIL/uL — ABNORMAL LOW (ref 4.22–5.81)
RDW: 13.5 % (ref 11.5–15.5)
WBC: 9.1 10*3/uL (ref 4.0–10.5)
nRBC: 0 % (ref 0.0–0.2)

## 2021-10-29 LAB — BASIC METABOLIC PANEL
Anion gap: 8 (ref 5–15)
BUN: 17 mg/dL (ref 8–23)
CO2: 22 mmol/L (ref 22–32)
Calcium: 8.4 mg/dL — ABNORMAL LOW (ref 8.9–10.3)
Chloride: 107 mmol/L (ref 98–111)
Creatinine, Ser: 1.29 mg/dL — ABNORMAL HIGH (ref 0.61–1.24)
GFR, Estimated: 53 mL/min — ABNORMAL LOW (ref >60)
Glucose, Bld: 108 mg/dL — ABNORMAL HIGH (ref 70–99)
Potassium: 3.7 mmol/L (ref 3.5–5.1)
Sodium: 137 mmol/L (ref 135–145)

## 2021-10-29 LAB — IRON AND TIBC
Iron: 48 ug/dL (ref 45–182)
Saturation Ratios: 32 % (ref 17.9–39.5)
TIBC: 153 ug/dL — ABNORMAL LOW (ref 250–450)
UIBC: 105 ug/dL

## 2021-10-29 LAB — GLUCOSE, CAPILLARY
Glucose-Capillary: 92 mg/dL (ref 70–99)
Glucose-Capillary: 139 mg/dL — ABNORMAL HIGH (ref 70–99)
Glucose-Capillary: 132 mg/dL — ABNORMAL HIGH (ref 70–99)
Glucose-Capillary: 155 mg/dL — ABNORMAL HIGH (ref 70–99)

## 2021-10-29 MED ORDER — IBUPROFEN 400 MG PO TABS
400.0000 mg | ORAL_TABLET | Freq: Four times a day (QID) | ORAL | Status: DC | PRN
  Administered 2021-10-29: 400 mg via ORAL
  Filled 2021-10-29: qty 1

## 2021-10-29 NOTE — Progress Notes (Signed)
Physical Therapy Treatment Patient Details Name: James BALDREE, James Bell MRN: [id number] DOB: Nov 21, 1930 Today's Date: 10/29/2021   History of Present Illness Pt is an 88 y.o. male presenting to hospital 2/7 with c/o weakness and fall (was weak anfd fell into the wall and slid to floor).  Pt admitted with postural dizziness with presyncope, cellulitis of L foot, infected abrasion of L heel (friction burn secondary to RLS), diarrhea, and frequent UTI.  PMH includes h/o CVA with R sided hemiplegia, CKD, BPH, anemia, HLD, OSA on CPAP, DM, CKD, h/o hiatal hernia, OSA on CPAP, RLS, hearing loss R ear, and CEA.    PT Comments    Pt was alert long sitting in bed requesting to have BM. He prefers to be called [name] and present with flat affect. He was cooperative throughout session but session greatly limited by frequent Bms. He was able to exit bed, stand, an ambulate to [facility] prior to standing and ambulating to recliner. Gait distances were limited by fatigue. Slightly SOB with minimal activity. He will require SNF at DC to address deficits prior to returning to independent living facility. He will continue to benefit from skilled PT to address deficits while maximizing independence with all ADLs.   Recommendations for follow up therapy are one component of a multi-disciplinary discharge planning process, led by the attending physician.  Recommendations may be updated based on patient status, additional functional criteria and insurance authorization.  Follow Up Recommendations  Skilled nursing-short term rehab (<3 hours/day)     Assistance Recommended at Discharge Frequent or constant Supervision/Assistance  Patient can return home with the following A little help with walking and/or transfers;A little help with bathing/dressing/bathroom;Assistance with cooking/housework;Assistance with feeding;Direct supervision/assist for medications management;Direct supervision/assist for financial  management;Assist for transportation;Help with stairs or ramp for entrance   Equipment Recommendations  Rolling walker (2 wheels);BSC/3in1;Wheelchair (measurements PT);Wheelchair cushion (measurements PT);Hospital bed       Precautions / Restrictions Precautions Precautions: Fall Restrictions Weight Bearing Restrictions: No     Mobility  Bed Mobility Overal bed mobility: Needs Assistance Bed Mobility: Supine to Sit     Supine to sit: Min assist     Transfers Overall transfer level: Needs assistance Equipment used: Rolling walker (2 wheels) Transfers: Sit to/from Stand Sit to Stand: Min assist, From elevated surface           General transfer comment: min assist required to stand from slightly elevated bed height and from [facility]. pt had BM during session that greatly slowed session progression. He fatigues quickly but overall tolerated well.    Ambulation/Gait Ambulation/Gait assistance: Min assist Gait Distance (Feet): 5 Feet Assistive device: Rolling walker (2 wheels) Gait Pattern/deviations: Step-to pattern, Trunk flexed, Shuffle Gait velocity: decreased     General Gait Details: pt was able to ambulate 5 ft to [facility] then another 5 ft to go to recliner. He has slow cadence and poor overall posture. Wil Vcs was able to correct. Unwilling due to fatigue from BM to ambulate greater distances.     Balance Overall balance assessment: Needs assistance Sitting-balance support: No upper extremity supported, Feet supported Sitting balance-Leahy Scale: Good Sitting balance - Comments: steady sitting reaching within BOS   Standing balance support: Bilateral upper extremity supported, During functional activity, Reliant on assistive device for balance Standing balance-Leahy Scale: Fair       Cognition Arousal/Alertness: Awake/alert Behavior During Therapy: WFL for tasks assessed/performed Overall Cognitive Status: Within Functional Limits for tasks assessed  General Comments: Pt is A and O x 4. has flat affect but cooperative. slow moving throughout               Pertinent Vitals/Pain Pain Assessment Pain Assessment: 0-10 Pain Score: 4  Pain Location: L heel Pain Descriptors / Indicators: Sore Pain Intervention(s): Limited activity within patient's tolerance, Monitored during session, Premedicated before session, Repositioned     PT Goals (current goals can now be found in the care plan section) Acute Rehab PT Goals Patient Stated Goal: get stronger so I can return to [facility] Progress towards PT goals: Progressing toward goals    Frequency    Min 2X/week      PT Plan Current plan remains appropriate    Co-evaluation     PT goals addressed during session: Mobility/safety with mobility;Balance;Strengthening/ROM;Proper use of DME        AM-PAC PT "6 Clicks" Mobility   Outcome Measure  Help needed turning from your back to your side while in a flat bed without using bedrails?: None Help needed moving from lying on your back to sitting on the side of a flat bed without using bedrails?: A Little Help needed moving to and from a bed to a chair (including a wheelchair)?: A Little Help needed standing up from a chair using your arms (e.g., wheelchair or bedside chair)?: A Little Help needed to walk in hospital room?: A Little Help needed climbing 3-5 steps with a railing? : A Lot 6 Click Score: 18    End of Session   Activity Tolerance: Patient limited by fatigue;Other (comment) (limited by frequent BMs) Patient left: in chair;with call bell/phone within reach;with chair alarm set;Other (comment) Nurse Communication: Mobility status;Precautions;Weight bearing status PT Visit Diagnosis: Other abnormalities of gait and mobility (R26.89);Muscle weakness (generalized) (M62.81);Difficulty in walking, not elsewhere classified (R26.2)     Time: 1010-1044 PT Time Calculation (min) (ACUTE ONLY): 34 min  Charges:  $Gait  Training: 8-22 mins $Therapeutic Activity: 8-22 mins                    Julaine Fusi PTA 10/29/21, 12:58 PM

## 2021-10-29 NOTE — Progress Notes (Signed)
PROGRESS NOTE    James Spurr, MD  ELF:[id number] DOB: 11-02-1930 DOA: 10/25/2021 PCP: [name], MD   Assessment & Plan:   Principal Problem:   Postural dizziness with presyncope Active Problems:   Hemiparesis affecting right side as late effect of cerebrovascular accident (CVA) ([place])   Uncontrolled type 2 diabetes mellitus with hyperglycemia, with long-term current use of insulin (HCC)   Stage 3a chronic kidney disease (HCC)   Restless leg syndrome, severe   HTN (hypertension)   Frequent UTI   Abrasion of heel, infected, left, initial encounter   Cellulitis of left foot   Diarrhea  C. diff: likely cause of the diarrhea. Continue on po vanco. Continue on contact/enteric precautions   Leukocytosis: resolved   Postural dizziness: w/ presyncope. Etiology unclear. Improved    Cellulitis of left foot: completed abx course. No debridement needed as per podiatry   Left infected abrasion of heel: improved. Friction burn left heel secondary to restless leg syndrome with superimposed infection. Completed abx course.   Unlikely bacteremia: blood cx growing stap epi, likely containment. Repeat blood cxs NGTD   DM2: fairly well controlled, HbA1c 7.2. Continue on glargine, SSI w/ accuchecks    Frequent UTIs: last UTI 08/2021 & treated.  Urine cx growing enterobacter approx 30,000 colonies/ml. Completed abx course    Restless leg syndrome: continue on home dose  of pramipexole    CKDIIIa: Cr is labile. Avoid nephrotoxic meds    Hemiparesis affecting right side: residual effect of CVA. Continue on plavix, statin      DVT prophylaxis: lovenox  Code Status: full  Family Communication: called pt's daughter, Wells Guiles, but no answer so I left a voicemail  Disposition Plan: likely d/c to SNF   Level of care: Med-Surg  Status is: Inpatient Remains inpatient appropriate because: diarrhea slightly improved today, still needs SNF placement      Consultants:  Podiatry    Procedures:   Antimicrobials: po vanco    Subjective: Pt c/o diarrhea but slightly improved from day prior   Objective: Vitals:   10/28/21 0827 10/28/21 1705 10/28/21 2041 10/29/21 0440  BP: 136/73 (!) 141/64 139/68 137/68  Pulse: 73 73 78 76  Resp: 16 16 16 16   Temp: 97.9 F (36.6 C) 97.9 F (36.6 C) 98.8 F (37.1 C) (!) 97.5 F (36.4 C)  TempSrc:      SpO2: 97% 99% 100% 98%  Weight:      Height:       No intake or output data in the 24 hours ending 10/29/21 0756  Filed Weights   10/25/21 1703  Weight: 88.9 kg    Examination:  General exam: appears comfortable  Respiratory system: clear breath sounds b/l. No rubs or clicks  Cardiovascular system: S1/S2+. No rubs or gallops  Gastrointestinal system: Abd is soft, NT, ND & hyperactive bowel sounds  Central nervous system: alert and oriented. Moves all extremities    Psychiatry: judgement and insight appears normal. Flat mood and affect    Data Reviewed: I have personally reviewed following labs and imaging studies  CBC: Recent Labs  Lab 10/25/21 1705 10/27/21 0326 10/28/21 0619 10/29/21 0433  WBC 12.4* 13.9* 10.4 9.1  HGB 13.3 12.4* 12.5* 12.4*  HCT 40.4 38.3* 38.2* 37.9*  MCV 92.0 91.0 91.0 90.5  PLT 193 178 187 585   Basic Metabolic Panel: Recent Labs  Lab 10/25/21 1705 10/26/21 0828 10/27/21 0326 10/28/21 0619 10/29/21 0433  NA 133*  --  136 135 137  K  4.2  --  4.4 3.7 3.7  CL 101  --  107 106 107  CO2 25  --  25 22 22   GLUCOSE 302*  --  148* 93 108*  BUN 20  --  17 18 17   CREATININE 1.35* 1.36* 1.50* 1.22 1.29*  CALCIUM 8.4*  --  7.8* 7.9* 8.4*   GFR: Estimated Creatinine Clearance: 42.9 mL/min (A) (by C-G formula based on SCr of 1.29 mg/dL (H)). Liver Function Tests: No results for input(s): AST, ALT, ALKPHOS, BILITOT, PROT, ALBUMIN in the last 168 hours. No results for input(s): LIPASE, AMYLASE in the last 168 hours. No results for input(s): AMMONIA in the last 168  hours. Coagulation Profile: No results for input(s): INR, PROTIME in the last 168 hours. Cardiac Enzymes: No results for input(s): CKTOTAL, CKMB, CKMBINDEX, TROPONINI in the last 168 hours. BNP (last 3 results) No results for input(s): PROBNP in the last 8760 hours. HbA1C: No results for input(s): HGBA1C in the last 72 hours.  CBG: Recent Labs  Lab 10/27/21 2249 10/28/21 0820 10/28/21 1243 10/28/21 1705 10/28/21 2038  GLUCAP 154* 79 128* 131* 124*   Lipid Profile: No results for input(s): CHOL, HDL, LDLCALC, TRIG, CHOLHDL, LDLDIRECT in the last 72 hours. Thyroid Function Tests: No results for input(s): TSH, T4TOTAL, FREET4, T3FREE, THYROIDAB in the last 72 hours. Anemia Panel: Recent Labs    10/29/21 0433  TIBC 153*  IRON 48   Sepsis Labs: Recent Labs  Lab 10/25/21 2232 10/26/21 0049  LATICACIDVEN 1.4 1.8    Recent Results (from the past 240 hour(s))  Urine Culture     Status: None   Collection Time: 10/19/21 10:32 AM   Specimen: Urine  Result Value Ref Range Status   MICRO NUMBER: [id number]  Final   SPECIMEN QUALITY: Adequate  Final   Sample Source NOT GIVEN  Final   STATUS: FINAL  Final   Result: No Growth  Final  Resp Panel by RT-PCR (Flu A&B, Covid) Nasopharyngeal Swab     Status: None   Collection Time: 10/25/21 10:32 PM   Specimen: Nasopharyngeal Swab; Nasopharyngeal(NP) swabs in vial transport medium  Result Value Ref Range Status   SARS Coronavirus 2 by RT PCR NEGATIVE NEGATIVE Final    Comment: (NOTE) SARS-CoV-2 target nucleic acids are NOT DETECTED.  The SARS-CoV-2 RNA is generally detectable in upper respiratory specimens during the acute phase of infection. The lowest concentration of SARS-CoV-2 viral copies this assay can detect is 138 copies/mL. A negative result does not preclude SARS-Cov-2 infection and should not be used as the sole basis for treatment or other patient management decisions. A negative result may occur with  improper  specimen collection/handling, submission of specimen other than nasopharyngeal swab, presence of viral mutation(s) within the areas targeted by this assay, and inadequate number of viral copies(<138 copies/mL). A negative result must be combined with clinical observations, patient history, and epidemiological information. The expected result is Negative.  Fact Sheet for Patients:  EntrepreneurPulse.com.au  Fact Sheet for Healthcare Providers:  IncredibleEmployment.be  This test is no t yet approved or cleared by the [place] FDA and  has been authorized for detection and/or diagnosis of SARS-CoV-2 by FDA under an Emergency Use Authorization (EUA). This EUA will remain  in effect (meaning this test can be used) for the duration of the COVID-19 declaration under Section 564(b)(1) of the Act, 21 U.S.C.section 360bbb-3(b)(1), unless the authorization is terminated  or revoked sooner.       Influenza A  by PCR NEGATIVE NEGATIVE Final   Influenza B by PCR NEGATIVE NEGATIVE Final    Comment: (NOTE) The Xpert Xpress SARS-CoV-2/FLU/RSV plus assay is intended as an aid in the diagnosis of influenza from Nasopharyngeal swab specimens and should not be used as a sole basis for treatment. Nasal washings and aspirates are unacceptable for Xpert Xpress SARS-CoV-2/FLU/RSV testing.  Fact Sheet for Patients: EntrepreneurPulse.com.au  Fact Sheet for Healthcare Providers: IncredibleEmployment.be  This test is not yet approved or cleared by the [place] FDA and has been authorized for detection and/or diagnosis of SARS-CoV-2 by FDA under an Emergency Use Authorization (EUA). This EUA will remain in effect (meaning this test can be used) for the duration of the COVID-19 declaration under Section 564(b)(1) of the Act, 21 U.S.C. section 360bbb-3(b)(1), unless the authorization is terminated or revoked.  Performed at  [facility], [address] [place], [place] [address]   Culture, blood (routine x 2)     Status: Abnormal   Collection Time: 10/25/21 10:33 PM   Specimen: BLOOD  Result Value Ref Range Status   Specimen Description   Final    BLOOD LEFT ANTECUBITAL Performed at [facility], [place] [address], [place], [place] [address]    Special Requests   Final    BOTTLES DRAWN AEROBIC AND ANAEROBIC Blood Culture adequate volume Performed at [facility], [address] [place], [place] [address]    Culture  Setup Time   Final    GRAM POSITIVE COCCI Organism ID to follow CRITICAL RESULT CALLED TO, READ BACK BY AND VERIFIED WITH: ABBY ELLINGTON @1438  10/26/21 SCS IN BOTH AEROBIC AND ANAEROBIC BOTTLES Performed at [facility], [address] [place], [place] [address]    Culture (A)  Final    STAPHYLOCOCCUS EPIDERMIDIS THE SIGNIFICANCE OF ISOLATING THIS ORGANISM FROM A SINGLE SET OF BLOOD CULTURES WHEN MULTIPLE SETS ARE DRAWN IS UNCERTAIN. PLEASE NOTIFY THE MICROBIOLOGY DEPARTMENT WITHIN ONE WEEK IF SPECIATION AND SENSITIVITIES ARE REQUIRED. Performed at [facility], [place] [address], [place], [place] [address]    Report Status 10/28/2021 FINAL  Final  Blood Culture ID Panel (Reflexed)     Status: Abnormal   Collection Time: 10/25/21 10:33 PM  Result Value Ref Range Status   Enterococcus faecalis NOT DETECTED NOT DETECTED Final   Enterococcus Faecium NOT DETECTED NOT DETECTED Final   Listeria monocytogenes NOT DETECTED NOT DETECTED Final   Staphylococcus species DETECTED (A) NOT DETECTED Final    Comment: CRITICAL RESULT CALLED TO, READ BACK BY AND VERIFIED WITH: ABBY ELLINGTON @1438  10/26/21 SCS    Staphylococcus aureus (BCID) NOT DETECTED NOT DETECTED Final   Staphylococcus epidermidis DETECTED (A) NOT DETECTED Final    Comment: Methicillin (oxacillin) resistant coagulase negative staphylococcus. Possible blood culture contaminant (unless isolated from  more than one blood culture draw or clinical case suggests pathogenicity). No antibiotic treatment is indicated for blood  culture contaminants. CRITICAL RESULT CALLED TO, READ BACK BY AND VERIFIED WITH: ABBY ELLINGTON @1438  10/26/21 SCS    Staphylococcus lugdunensis NOT DETECTED NOT DETECTED Final   Streptococcus species NOT DETECTED NOT DETECTED Final   Streptococcus agalactiae NOT DETECTED NOT DETECTED Final   Streptococcus pneumoniae NOT DETECTED NOT DETECTED Final   Streptococcus pyogenes NOT DETECTED NOT DETECTED Final   A.calcoaceticus-baumannii NOT DETECTED NOT DETECTED Final   Bacteroides fragilis NOT DETECTED NOT DETECTED Final   Enterobacterales NOT DETECTED NOT DETECTED Final   Enterobacter cloacae complex NOT DETECTED NOT DETECTED Final  Escherichia coli NOT DETECTED NOT DETECTED Final   Klebsiella aerogenes NOT DETECTED NOT DETECTED Final   Klebsiella oxytoca NOT DETECTED NOT DETECTED Final   Klebsiella pneumoniae NOT DETECTED NOT DETECTED Final   Proteus species NOT DETECTED NOT DETECTED Final   Salmonella species NOT DETECTED NOT DETECTED Final   Serratia marcescens NOT DETECTED NOT DETECTED Final   Haemophilus influenzae NOT DETECTED NOT DETECTED Final   Neisseria meningitidis NOT DETECTED NOT DETECTED Final   Pseudomonas aeruginosa NOT DETECTED NOT DETECTED Final   Stenotrophomonas maltophilia NOT DETECTED NOT DETECTED Final   Candida albicans NOT DETECTED NOT DETECTED Final   Candida auris NOT DETECTED NOT DETECTED Final   Candida glabrata NOT DETECTED NOT DETECTED Final   Candida krusei NOT DETECTED NOT DETECTED Final   Candida parapsilosis NOT DETECTED NOT DETECTED Final   Candida tropicalis NOT DETECTED NOT DETECTED Final   Cryptococcus neoformans/gattii NOT DETECTED NOT DETECTED Final   Methicillin resistance mecA/C DETECTED (A) NOT DETECTED Final    Comment: CRITICAL RESULT CALLED TO, READ BACK BY AND VERIFIED WITH: ABBY ELLINGTON @1438  10/26/21 SCS Performed  at [facility], [address] [place], [place] [address]   Culture, blood (Routine X 2) w Reflex to ID Panel     Status: None (Preliminary result)   Collection Time: 10/26/21 12:49 AM   Specimen: BLOOD  Result Value Ref Range Status   Specimen Description BLOOD RIGHT HAND  Final   Special Requests   Final    BOTTLES DRAWN AEROBIC ONLY Blood Culture results may not be optimal due to an inadequate volume of blood received in culture bottles   Culture   Final    NO GROWTH 3 DAYS Performed at [facility], [address] [place], [place] [address]    Report Status PENDING  Incomplete  Urine Culture     Status: Abnormal (Preliminary result)   Collection Time: 10/26/21  3:31 AM   Specimen: Urine, Random  Result Value Ref Range Status   Specimen Description   Final    URINE, RANDOM Performed at [facility], [address], [place], [place] [address]    Special Requests   Final    NONE Performed at [facility], [address], [place], [place] [address]    Culture (A)  Final    30,000 COLONIES/mL ENTEROBACTER AEROGENES SUSCEPTIBILITIES TO FOLLOW Performed at [facility], [address], [place], [place] [address]    Report Status PENDING  Incomplete  CULTURE, BLOOD (ROUTINE X 2) w Reflex to ID Panel     Status: None (Preliminary result)   Collection Time: 10/27/21  8:16 AM   Specimen: BLOOD RIGHT HAND  Result Value Ref Range Status   Specimen Description BLOOD RIGHT HAND  Final   Special Requests   Final    BOTTLES DRAWN AEROBIC AND ANAEROBIC Blood Culture adequate volume   Culture   Final    NO GROWTH 2 DAYS Performed at [facility], [address], [place],  [address]    Report Status PENDING  Incomplete  CULTURE, BLOOD (ROUTINE X 2) w Reflex to ID Panel     Status: None (Preliminary result)   Collection Time: 10/27/21  8:23 AM   Specimen: BLOOD LEFT HAND  Result Value Ref Range Status   Specimen  Description BLOOD LEFT HAND  Final   Special Requests   Final    BOTTLES DRAWN AEROBIC ONLY Blood Culture results may not be optimal due  to an inadequate volume of blood received in culture bottles   Culture   Final    NO GROWTH 2 DAYS Performed at [facility], [address] [place], [place] [address]    Report Status PENDING  Incomplete  MRSA Next Gen by PCR, Nasal     Status: Abnormal   Collection Time: 10/27/21  9:42 AM   Specimen: Nasal Mucosa; Nasal Swab  Result Value Ref Range Status   MRSA by PCR Next Gen DETECTED (A) NOT DETECTED Final    Comment: RESULT CALLED TO, READ BACK BY AND VERIFIED WITH: ANGELA HESTER 10/27/21 1055 MW (NOTE) The GeneXpert MRSA Assay (FDA approved for NASAL specimens only), is one component of a comprehensive MRSA colonization surveillance program. It is not intended to diagnose MRSA infection nor to guide or monitor treatment for MRSA infections. Test performance is not FDA approved in patients less than 19 years old. Performed at [facility], [address] [place], [place] [address]   Gastrointestinal Panel by PCR , Stool     Status: None   Collection Time: 10/28/21  5:15 AM   Specimen: Stool  Result Value Ref Range Status   Campylobacter species NOT DETECTED NOT DETECTED Final   Plesimonas shigelloides NOT DETECTED NOT DETECTED Final   Salmonella species NOT DETECTED NOT DETECTED Final   Yersinia enterocolitica NOT DETECTED NOT DETECTED Final   Vibrio species NOT DETECTED NOT DETECTED Final   Vibrio cholerae NOT DETECTED NOT DETECTED Final   Enteroaggregative E coli (EAEC) NOT DETECTED NOT DETECTED Final   Enteropathogenic E coli (EPEC) NOT DETECTED NOT DETECTED Final   Enterotoxigenic E coli (ETEC) NOT DETECTED NOT DETECTED Final   Shiga like toxin producing E coli (STEC) NOT DETECTED NOT DETECTED Final   Shigella/Enteroinvasive E coli (EIEC) NOT DETECTED NOT DETECTED Final   Cryptosporidium NOT DETECTED NOT DETECTED  Final   Cyclospora cayetanensis NOT DETECTED NOT DETECTED Final   Entamoeba histolytica NOT DETECTED NOT DETECTED Final   Giardia lamblia NOT DETECTED NOT DETECTED Final   Adenovirus F40/41 NOT DETECTED NOT DETECTED Final   Astrovirus NOT DETECTED NOT DETECTED Final   Norovirus GI/GII NOT DETECTED NOT DETECTED Final   Rotavirus A NOT DETECTED NOT DETECTED Final   Sapovirus (I, II, IV, and V) NOT DETECTED NOT DETECTED Final    Comment: Performed at [facility], [address] [place], [name] [address]  C Difficile Quick Screen w PCR reflex     Status: Abnormal   Collection Time: 10/28/21  5:15 AM   Specimen: STOOL  Result Value Ref Range Status   C Diff antigen POSITIVE (A) NEGATIVE Final   C Diff toxin POSITIVE (A) NEGATIVE Final   C Diff interpretation Toxin producing C. difficile detected.  Final    Comment: CRITICAL RESULT CALLED TO, READ BACK BY AND VERIFIED WITH: ADEYEMI AJIBADE AT 4650 10/28/21.PMF Performed at [facility], [address], [place], [place] [address]          Radiology Studies: No results found.      Scheduled Meds:  clopidogrel  75 mg Oral Daily   DULoxetine  60 mg Oral Daily   gabapentin  600 mg Oral BID   insulin aspart  0-15 Units Subcutaneous TID WC   insulin aspart  0-5 Units Subcutaneous QHS   insulin glargine-yfgn  20 Units Subcutaneous QHS   levothyroxine  50 mcg Oral Q0600   melatonin  5 mg Oral QHS   pantoprazole  40  mg Oral Q breakfast   pramipexole  1 mg Oral q1600   tamsulosin  0.4 mg Oral QPC breakfast   vancomycin  125 mg Oral QID   Continuous Infusions:     LOS: 4 days    Time spent: 20 mins     Wyvonnia Dusky, MD [facility] Pager [phone]  If 7PM-7AM, please contact night-coverage 10/29/2021, 7:56 AM

## 2021-10-30 LAB — BASIC METABOLIC PANEL
Anion gap: 7 (ref 5–15)
BUN: 18 mg/dL (ref 8–23)
CO2: 23 mmol/L (ref 22–32)
Calcium: 8.3 mg/dL — ABNORMAL LOW (ref 8.9–10.3)
Chloride: 105 mmol/L (ref 98–111)
Creatinine, Ser: 1.36 mg/dL — ABNORMAL HIGH (ref 0.61–1.24)
GFR, Estimated: 50 mL/min — ABNORMAL LOW (ref >60)
Glucose, Bld: 117 mg/dL — ABNORMAL HIGH (ref 70–99)
Potassium: 3.9 mmol/L (ref 3.5–5.1)
Sodium: 135 mmol/L (ref 135–145)

## 2021-10-30 LAB — CBC
HCT: 37.1 % — ABNORMAL LOW (ref 39.0–52.0)
Hemoglobin: 12.1 g/dL — ABNORMAL LOW (ref 13.0–17.0)
MCH: 29 pg (ref 26.0–34.0)
MCHC: 32.6 g/dL (ref 30.0–36.0)
MCV: 89 fL (ref 80.0–100.0)
Platelets: 219 10*3/uL (ref 150–400)
RBC: 4.17 MIL/uL — ABNORMAL LOW (ref 4.22–5.81)
RDW: 13.5 % (ref 11.5–15.5)
WBC: 9.4 10*3/uL (ref 4.0–10.5)
nRBC: 0 % (ref 0.0–0.2)

## 2021-10-30 LAB — GLUCOSE, CAPILLARY
Glucose-Capillary: 71 mg/dL (ref 70–99)
Glucose-Capillary: 149 mg/dL — ABNORMAL HIGH (ref 70–99)
Glucose-Capillary: 104 mg/dL — ABNORMAL HIGH (ref 70–99)
Glucose-Capillary: 184 mg/dL — ABNORMAL HIGH (ref 70–99)

## 2021-10-30 NOTE — Progress Notes (Signed)
PROGRESS NOTE    James Spurr, MD  BJS:[id number] DOB: 13-Jun-1931 DOA: 10/25/2021 PCP: [name], MD   Assessment & Plan:   Principal Problem:   Postural dizziness with presyncope Active Problems:   Hemiparesis affecting right side as late effect of cerebrovascular accident (CVA) ([place])   Uncontrolled type 2 diabetes mellitus with hyperglycemia, with long-term current use of insulin (HCC)   Stage 3a chronic kidney disease (HCC)   Restless leg syndrome, severe   HTN (hypertension)   Frequent UTI   Abrasion of heel, infected, left, initial encounter   Cellulitis of left foot   Diarrhea  C. diff: likely cause of the diarrhea. Improving. Continue on po vanco. Continue on contact/enteric precautions   Leukocytosis: resolved   Postural dizziness: w/ presyncope. Etiology unclear. Resolved    Cellulitis of left foot: completed abx course. No debridement needed as per podiatry   Left infected abrasion of heel: improved. Friction burn left heel secondary to restless leg syndrome with superimposed infection. Completed abx course.   Unlikely bacteremia: blood cx growing stap epi, likely containment. Repeat blood cxs NGTD    DM2: fair well controlled, HbA1c 7.2. Continue on glargine, SSI w/ accuchecks    Frequent UTIs: last UTI 08/2021 & treated.  Urine cx growing enterobacter approx 30,000 colonies/ml. Completed abx course    Restless leg syndrome: continue on home dose of pramipexole    CKDIIIa: Cr is labile. Will continue to monitor    Hemiparesis affecting right side: residual effect of CVA. Continue on statin, plavix      DVT prophylaxis: lovenox  Code Status: full  Family Communication:  Disposition Plan: likely d/c to SNF   Level of care: Med-Surg  Status is: Inpatient Remains inpatient appropriate because: diarrhea slightly improved today, still needs SNF placement      Consultants:  Podiatry   Procedures:   Antimicrobials: po vanco     Subjective: Pt c/o generalized weakness   Objective: Vitals:   10/29/21 1247 10/29/21 1751 10/29/21 2126 10/30/21 0415  BP: 117/61 125/60 128/61 115/61  Pulse: 63 66 72 67  Resp: 16 16 16 20   Temp: 97.8 F (36.6 C) 97.9 F (36.6 C) (!) 97.4 F (36.3 C) 98.7 F (37.1 C)  TempSrc:      SpO2: 100% 100% 98% 95%  Weight:      Height:        Intake/Output Summary (Last 24 hours) at 10/30/2021 0734 Last data filed at 10/29/2021 0900 Gross per 24 hour  Intake 240 ml  Output --  Net 240 ml    Filed Weights   10/25/21 1703  Weight: 88.9 kg    Examination:  General exam: appears calm & comfortable  Respiratory system: clear breath sounds b/l   Cardiovascular system: S1 & S2+. No rubs or clicks   Gastrointestinal system: abd is soft, NT, ND & normal bowel sounds Central nervous system: alert and oriented. Moves all extremities     Psychiatry: judgement and insight appears normal. Flat mood and affect     Data Reviewed: I have personally reviewed following labs and imaging studies  CBC: Recent Labs  Lab 10/25/21 1705 10/27/21 0326 10/28/21 0619 10/29/21 0433 10/30/21 0410  WBC 12.4* 13.9* 10.4 9.1 9.4  HGB 13.3 12.4* 12.5* 12.4* 12.1*  HCT 40.4 38.3* 38.2* 37.9* 37.1*  MCV 92.0 91.0 91.0 90.5 89.0  PLT 193 178 187 229 607   Basic Metabolic Panel: Recent Labs  Lab 10/25/21 1705 10/26/21 0828 10/27/21 0326 10/28/21  3267 10/29/21 0433 10/30/21 0410  NA 133*  --  136 135 137 135  K 4.2  --  4.4 3.7 3.7 3.9  CL 101  --  107 106 107 105  CO2 25  --  25 22 22 23   GLUCOSE 302*  --  148* 93 108* 117*  BUN 20  --  17 18 17 18   CREATININE 1.35* 1.36* 1.50* 1.22 1.29* 1.36*  CALCIUM 8.4*  --  7.8* 7.9* 8.4* 8.3*   GFR: Estimated Creatinine Clearance: 40.7 mL/min (A) (by C-G formula based on SCr of 1.36 mg/dL (H)). Liver Function Tests: No results for input(s): AST, ALT, ALKPHOS, BILITOT, PROT, ALBUMIN in the last 168 hours. No results for input(s): LIPASE,  AMYLASE in the last 168 hours. No results for input(s): AMMONIA in the last 168 hours. Coagulation Profile: No results for input(s): INR, PROTIME in the last 168 hours. Cardiac Enzymes: No results for input(s): CKTOTAL, CKMB, CKMBINDEX, TROPONINI in the last 168 hours. BNP (last 3 results) No results for input(s): PROBNP in the last 8760 hours. HbA1C: No results for input(s): HGBA1C in the last 72 hours.  CBG: Recent Labs  Lab 10/28/21 2038 10/29/21 0829 10/29/21 1255 10/29/21 1658 10/29/21 2125  GLUCAP 124* 92 139* 132* 155*   Lipid Profile: No results for input(s): CHOL, HDL, LDLCALC, TRIG, CHOLHDL, LDLDIRECT in the last 72 hours. Thyroid Function Tests: No results for input(s): TSH, T4TOTAL, FREET4, T3FREE, THYROIDAB in the last 72 hours. Anemia Panel: Recent Labs    10/29/21 0433  TIBC 153*  IRON 48   Sepsis Labs: Recent Labs  Lab 10/25/21 2232 10/26/21 0049  LATICACIDVEN 1.4 1.8    Recent Results (from the past 240 hour(s))  Resp Panel by RT-PCR (Flu A&B, Covid) Nasopharyngeal Swab     Status: None   Collection Time: 10/25/21 10:32 PM   Specimen: Nasopharyngeal Swab; Nasopharyngeal(NP) swabs in vial transport medium  Result Value Ref Range Status   SARS Coronavirus 2 by RT PCR NEGATIVE NEGATIVE Final    Comment: (NOTE) SARS-CoV-2 target nucleic acids are NOT DETECTED.  The SARS-CoV-2 RNA is generally detectable in upper respiratory specimens during the acute phase of infection. The lowest concentration of SARS-CoV-2 viral copies this assay can detect is 138 copies/mL. A negative result does not preclude SARS-Cov-2 infection and should not be used as the sole basis for treatment or other patient management decisions. A negative result may occur with  improper specimen collection/handling, submission of specimen other than nasopharyngeal swab, presence of viral mutation(s) within the areas targeted by this assay, and inadequate number of viral copies(<138  copies/mL). A negative result must be combined with clinical observations, patient history, and epidemiological information. The expected result is Negative.  Fact Sheet for Patients:  EntrepreneurPulse.com.au  Fact Sheet for Healthcare Providers:  IncredibleEmployment.be  This test is no t yet approved or cleared by the [place] FDA and  has been authorized for detection and/or diagnosis of SARS-CoV-2 by FDA under an Emergency Use Authorization (EUA). This EUA will remain  in effect (meaning this test can be used) for the duration of the COVID-19 declaration under Section 564(b)(1) of the Act, 21 U.S.C.section 360bbb-3(b)(1), unless the authorization is terminated  or revoked sooner.       Influenza A by PCR NEGATIVE NEGATIVE Final   Influenza B by PCR NEGATIVE NEGATIVE Final    Comment: (NOTE) The Xpert Xpress SARS-CoV-2/FLU/RSV plus assay is intended as an aid in the diagnosis of influenza from Nasopharyngeal  swab specimens and should not be used as a sole basis for treatment. Nasal washings and aspirates are unacceptable for Xpert Xpress SARS-CoV-2/FLU/RSV testing.  Fact Sheet for Patients: EntrepreneurPulse.com.au  Fact Sheet for Healthcare Providers: IncredibleEmployment.be  This test is not yet approved or cleared by the [place] FDA and has been authorized for detection and/or diagnosis of SARS-CoV-2 by FDA under an Emergency Use Authorization (EUA). This EUA will remain in effect (meaning this test can be used) for the duration of the COVID-19 declaration under Section 564(b)(1) of the Act, 21 U.S.C. section 360bbb-3(b)(1), unless the authorization is terminated or revoked.  Performed at [facility], [address] [place], [place] [address]   Culture, blood (routine x 2)     Status: Abnormal   Collection Time: 10/25/21 10:33 PM   Specimen: BLOOD  Result Value Ref  Range Status   Specimen Description   Final    BLOOD LEFT ANTECUBITAL Performed at [facility], [place] [address], [place], [place] [address]    Special Requests   Final    BOTTLES DRAWN AEROBIC AND ANAEROBIC Blood Culture adequate volume Performed at [facility], [address] [place], [place] [address]    Culture  Setup Time   Final    GRAM POSITIVE COCCI Organism ID to follow CRITICAL RESULT CALLED TO, READ BACK BY AND VERIFIED WITH: ABBY ELLINGTON @1438  10/26/21 SCS IN BOTH AEROBIC AND ANAEROBIC BOTTLES Performed at [facility], [address] [place], [place] [address]    Culture (A)  Final    STAPHYLOCOCCUS EPIDERMIDIS THE SIGNIFICANCE OF ISOLATING THIS ORGANISM FROM A SINGLE SET OF BLOOD CULTURES WHEN MULTIPLE SETS ARE DRAWN IS UNCERTAIN. PLEASE NOTIFY THE MICROBIOLOGY DEPARTMENT WITHIN ONE WEEK IF SPECIATION AND SENSITIVITIES ARE REQUIRED. Performed at [facility], [place] [address] [place], [place] [address]    Report Status 10/28/2021 FINAL  Final  Blood Culture ID Panel (Reflexed)     Status: Abnormal   Collection Time: 10/25/21 10:33 PM  Result Value Ref Range Status   Enterococcus faecalis NOT DETECTED NOT DETECTED Final   Enterococcus Faecium NOT DETECTED NOT DETECTED Final   Listeria monocytogenes NOT DETECTED NOT DETECTED Final   Staphylococcus species DETECTED (A) NOT DETECTED Final    Comment: CRITICAL RESULT CALLED TO, READ BACK BY AND VERIFIED WITH: ABBY ELLINGTON @1438  10/26/21 SCS    Staphylococcus aureus (BCID) NOT DETECTED NOT DETECTED Final   Staphylococcus epidermidis DETECTED (A) NOT DETECTED Final    Comment: Methicillin (oxacillin) resistant coagulase negative staphylococcus. Possible blood culture contaminant (unless isolated from more than one blood culture draw or clinical case suggests pathogenicity). No antibiotic treatment is indicated for blood  culture contaminants. CRITICAL RESULT CALLED TO, READ BACK BY AND  VERIFIED WITH: ABBY ELLINGTON @1438  10/26/21 SCS    Staphylococcus lugdunensis NOT DETECTED NOT DETECTED Final   Streptococcus species NOT DETECTED NOT DETECTED Final   Streptococcus agalactiae NOT DETECTED NOT DETECTED Final   Streptococcus pneumoniae NOT DETECTED NOT DETECTED Final   Streptococcus pyogenes NOT DETECTED NOT DETECTED Final   A.calcoaceticus-baumannii NOT DETECTED NOT DETECTED Final   Bacteroides fragilis NOT DETECTED NOT DETECTED Final   Enterobacterales NOT DETECTED NOT DETECTED Final   Enterobacter cloacae complex NOT DETECTED NOT DETECTED Final   Escherichia coli NOT DETECTED NOT DETECTED Final   Klebsiella aerogenes NOT DETECTED NOT DETECTED Final   Klebsiella oxytoca NOT DETECTED NOT DETECTED Final   Klebsiella pneumoniae NOT DETECTED NOT DETECTED Final  Proteus species NOT DETECTED NOT DETECTED Final   Salmonella species NOT DETECTED NOT DETECTED Final   Serratia marcescens NOT DETECTED NOT DETECTED Final   Haemophilus influenzae NOT DETECTED NOT DETECTED Final   Neisseria meningitidis NOT DETECTED NOT DETECTED Final   Pseudomonas aeruginosa NOT DETECTED NOT DETECTED Final   Stenotrophomonas maltophilia NOT DETECTED NOT DETECTED Final   Candida albicans NOT DETECTED NOT DETECTED Final   Candida auris NOT DETECTED NOT DETECTED Final   Candida glabrata NOT DETECTED NOT DETECTED Final   Candida krusei NOT DETECTED NOT DETECTED Final   Candida parapsilosis NOT DETECTED NOT DETECTED Final   Candida tropicalis NOT DETECTED NOT DETECTED Final   Cryptococcus neoformans/gattii NOT DETECTED NOT DETECTED Final   Methicillin resistance mecA/C DETECTED (A) NOT DETECTED Final    Comment: CRITICAL RESULT CALLED TO, READ BACK BY AND VERIFIED WITH: ABBY ELLINGTON @1438  10/26/21 SCS Performed at [facility], [address] [place], [place] [address]   Culture, blood (Routine X 2) w Reflex to ID Panel     Status: None (Preliminary result)   Collection Time: 10/26/21  12:49 AM   Specimen: BLOOD  Result Value Ref Range Status   Specimen Description BLOOD RIGHT HAND  Final   Special Requests   Final    BOTTLES DRAWN AEROBIC ONLY Blood Culture results may not be optimal due to an inadequate volume of blood received in culture bottles   Culture   Final    NO GROWTH 4 DAYS Performed at [facility], [address] [place], [place] [address]    Report Status PENDING  Incomplete  Urine Culture     Status: Abnormal   Collection Time: 10/26/21  3:31 AM   Specimen: Urine, Random  Result Value Ref Range Status   Specimen Description   Final    URINE, RANDOM Performed at [facility], [address] [place], [place] [address]    Special Requests   Final    NONE Performed at [facility], [address] [place], [place] [address]    Culture 30,000 COLONIES/mL ENTEROBACTER AEROGENES (A)  Final   Report Status 10/29/2021 FINAL  Final   Organism ID, Bacteria ENTEROBACTER AEROGENES (A)  Final      Susceptibility   Enterobacter aerogenes - MIC*    CEFAZOLIN >=64 RESISTANT Resistant     CEFEPIME <=0.12 SENSITIVE Sensitive     CEFTRIAXONE <=0.25 SENSITIVE Sensitive     CIPROFLOXACIN <=0.25 SENSITIVE Sensitive     GENTAMICIN <=1 SENSITIVE Sensitive     IMIPENEM 2 SENSITIVE Sensitive     NITROFURANTOIN 32 SENSITIVE Sensitive     TRIMETH/SULFA <=20 SENSITIVE Sensitive     PIP/TAZO <=4 SENSITIVE Sensitive     * 30,000 COLONIES/mL ENTEROBACTER AEROGENES  CULTURE, BLOOD (ROUTINE X 2) w Reflex to ID Panel     Status: None (Preliminary result)   Collection Time: 10/27/21  8:16 AM   Specimen: BLOOD RIGHT HAND  Result Value Ref Range Status   Specimen Description BLOOD RIGHT HAND  Final   Special Requests   Final    BOTTLES DRAWN AEROBIC AND ANAEROBIC Blood Culture adequate volume   Culture   Final    NO GROWTH 3 DAYS Performed at [facility], [address] [place],  [address]    Report Status PENDING   Incomplete  CULTURE, BLOOD (ROUTINE X 2) w Reflex to ID Panel     Status: None (Preliminary result)   Collection Time: 10/27/21  8:23 AM   Specimen: BLOOD LEFT HAND  Result Value Ref Range Status   Specimen Description BLOOD LEFT HAND  Final   Special Requests   Final    BOTTLES DRAWN AEROBIC ONLY Blood Culture results may not be optimal due to an inadequate volume of blood received in culture bottles   Culture   Final    NO GROWTH 3 DAYS Performed at [facility], [address], [place], [place] [address]    Report Status PENDING  Incomplete  MRSA Next Gen by PCR, Nasal     Status: Abnormal   Collection Time: 10/27/21  9:42 AM   Specimen: Nasal Mucosa; Nasal Swab  Result Value Ref Range Status   MRSA by PCR Next Gen DETECTED (A) NOT DETECTED Final    Comment: RESULT CALLED TO, READ BACK BY AND VERIFIED WITH: ANGELA HESTER 10/27/21 1055 MW (NOTE) The GeneXpert MRSA Assay (FDA approved for NASAL specimens only), is one component of a comprehensive MRSA colonization surveillance program. It is not intended to diagnose MRSA infection nor to guide or monitor treatment for MRSA infections. Test performance is not FDA approved in patients less than 49 years old. Performed at [facility], [address] [place], [place] [address]   Gastrointestinal Panel by PCR , Stool     Status: None   Collection Time: 10/28/21  5:15 AM   Specimen: Stool  Result Value Ref Range Status   Campylobacter species NOT DETECTED NOT DETECTED Final   Plesimonas shigelloides NOT DETECTED NOT DETECTED Final   Salmonella species NOT DETECTED NOT DETECTED Final   Yersinia enterocolitica NOT DETECTED NOT DETECTED Final   Vibrio species NOT DETECTED NOT DETECTED Final   Vibrio cholerae NOT DETECTED NOT DETECTED Final   Enteroaggregative E coli (EAEC) NOT DETECTED NOT DETECTED Final   Enteropathogenic E coli (EPEC) NOT DETECTED NOT DETECTED Final   Enterotoxigenic E coli (ETEC) NOT DETECTED  NOT DETECTED Final   Shiga like toxin producing E coli (STEC) NOT DETECTED NOT DETECTED Final   Shigella/Enteroinvasive E coli (EIEC) NOT DETECTED NOT DETECTED Final   Cryptosporidium NOT DETECTED NOT DETECTED Final   Cyclospora cayetanensis NOT DETECTED NOT DETECTED Final   Entamoeba histolytica NOT DETECTED NOT DETECTED Final   Giardia lamblia NOT DETECTED NOT DETECTED Final   Adenovirus F40/41 NOT DETECTED NOT DETECTED Final   Astrovirus NOT DETECTED NOT DETECTED Final   Norovirus GI/GII NOT DETECTED NOT DETECTED Final   Rotavirus A NOT DETECTED NOT DETECTED Final   Sapovirus (I, II, IV, and V) NOT DETECTED NOT DETECTED Final    Comment: Performed at [facility], [address] [place], [name] [address]  C Difficile Quick Screen w PCR reflex     Status: Abnormal   Collection Time: 10/28/21  5:15 AM   Specimen: STOOL  Result Value Ref Range Status   C Diff antigen POSITIVE (A) NEGATIVE Final   C Diff toxin POSITIVE (A) NEGATIVE Final   C Diff interpretation Toxin producing C. difficile detected.  Final    Comment: CRITICAL RESULT CALLED TO, READ BACK BY AND VERIFIED WITH: ADEYEMI AJIBADE AT 9924 10/28/21.PMF Performed at [facility], [address] [place], [place] [address]          Radiology Studies: No results found.      Scheduled Meds:  clopidogrel  75 mg Oral Daily   DULoxetine  60 mg Oral Daily   gabapentin  600 mg Oral BID   insulin aspart  0-15 Units Subcutaneous TID WC   insulin aspart  0-5 Units Subcutaneous QHS   insulin glargine-yfgn  20 Units Subcutaneous QHS   levothyroxine  50 mcg Oral Q0600   melatonin  5 mg Oral QHS   pantoprazole  40 mg Oral Q breakfast   pramipexole  1 mg Oral q1600   tamsulosin  0.4 mg Oral QPC breakfast   vancomycin  125 mg Oral QID   Continuous Infusions:     LOS: 5 days    Time spent: 15 mins     Wyvonnia Dusky, MD [facility] Pager [phone]  If 7PM-7AM, please contact  night-coverage 10/30/2021, 7:34 AM

## 2021-10-31 LAB — BASIC METABOLIC PANEL
Anion gap: 11 (ref 5–15)
BUN: 18 mg/dL (ref 8–23)
CO2: 23 mmol/L (ref 22–32)
Calcium: 8.3 mg/dL — ABNORMAL LOW (ref 8.9–10.3)
Chloride: 100 mmol/L (ref 98–111)
Creatinine, Ser: 1.17 mg/dL (ref 0.61–1.24)
GFR, Estimated: 60 mL/min — ABNORMAL LOW (ref >60)
Glucose, Bld: 179 mg/dL — ABNORMAL HIGH (ref 70–99)
Potassium: 3.8 mmol/L (ref 3.5–5.1)
Sodium: 134 mmol/L — ABNORMAL LOW (ref 135–145)

## 2021-10-31 LAB — CBC
HCT: 39.1 % (ref 39.0–52.0)
Hemoglobin: 12.8 g/dL — ABNORMAL LOW (ref 13.0–17.0)
MCH: 29.2 pg (ref 26.0–34.0)
MCHC: 32.7 g/dL (ref 30.0–36.0)
MCV: 89.3 fL (ref 80.0–100.0)
Platelets: 225 10*3/uL (ref 150–400)
RBC: 4.38 MIL/uL (ref 4.22–5.81)
RDW: 13.4 % (ref 11.5–15.5)
WBC: 9.9 10*3/uL (ref 4.0–10.5)
nRBC: 0 % (ref 0.0–0.2)

## 2021-10-31 LAB — CULTURE, BLOOD (ROUTINE X 2): Culture: NO GROWTH

## 2021-10-31 LAB — GLUCOSE, CAPILLARY
Glucose-Capillary: 154 mg/dL — ABNORMAL HIGH (ref 70–99)
Glucose-Capillary: 122 mg/dL — ABNORMAL HIGH (ref 70–99)
Glucose-Capillary: 181 mg/dL — ABNORMAL HIGH (ref 70–99)
Glucose-Capillary: 128 mg/dL — ABNORMAL HIGH (ref 70–99)

## 2021-10-31 MED ORDER — CHLORHEXIDINE GLUCONATE CLOTH 2 % EX PADS
6.0000 | MEDICATED_PAD | Freq: Every day | CUTANEOUS | Status: DC
  Administered 2021-10-31 – 2021-11-02 (×3): 6 via TOPICAL

## 2021-10-31 MED ORDER — MUPIROCIN 2 % EX OINT
1.0000 "application " | TOPICAL_OINTMENT | Freq: Two times a day (BID) | CUTANEOUS | Status: DC
  Administered 2021-10-31 – 2021-11-02 (×4): 1 via NASAL
  Filled 2021-10-31 (×2): qty 22

## 2021-10-31 NOTE — TOC Progression Note (Addendum)
Transition of Care [facility]) - Progression Note    Patient Details  Name: James GEFFRE, MD MRN: [id number] Date of Birth: Aug 24, 1931  Transition of [facility]) CM/SW [place], RN Phone Number: 10/31/2021, 1:54 PM  Clinical Narrative: Attempted to call patient no answer, left message. Called daughter discussed SNF bed offer she prefers to do research and get back with me this afternoon.   4:30pm. Daughter called back declines [facility], request that [facility] search other facilities, WOM ok, text sent to Neoma Laming to notify TOC if she can make bed offer, no other facilities local searches.   Expected Discharge Plan: [place] Barriers to Discharge: Continued Medical Work up  Expected Discharge Plan and Services Expected Discharge Plan: [place]   Discharge Planning Services: CM Consult   Living arrangements for the past 2 months: Assisted Living Facility                                       Social Determinants of Health (SDOH) Interventions    Readmission Risk Interventions No flowsheet data found.

## 2021-10-31 NOTE — Progress Notes (Signed)
PROGRESS NOTE    James Spurr, MD  HBZ:[id number] DOB: 10/25/1930 DOA: 10/25/2021 PCP: [name], MD   Assessment & Plan:   Principal Problem:   Postural dizziness with presyncope Active Problems:   Hemiparesis affecting right side as late effect of cerebrovascular accident (CVA) ([place])   Uncontrolled type 2 diabetes mellitus with hyperglycemia, with long-term current use of insulin (HCC)   Stage 3a chronic kidney disease (HCC)   Restless leg syndrome, severe   HTN (hypertension)   Frequent UTI   Abrasion of heel, infected, left, initial encounter   Cellulitis of left foot   Diarrhea  C. diff: likely cause of the diarrhea. Continue on po vanco. Continue on contact/enteric precautions   Leukocytosis: resolved   Postural dizziness: w/ presyncope. Etiology unclear. Resolved    Cellulitis of left foot: completed abx course. No debridement needed as per podiatry   Left infected abrasion of heel: improving. Friction burn left heel secondary to restless leg syndrome. Completed abx course   Unlikely bacteremia: blood cx growing stap epi, likely containment. Repeat blood cx NGTD   DM2: HbA1c, fairly well controlled. Continue on glargine, SSI w/ accuchecks   Frequent UTIs: last UTI 08/2021 & treated.  Urine cx growing enterobacter approx 30,000 colonies/ml. Completed abx course    Restless leg syndrome: continue on home dose of pramipexole    CKDIIIa: Cr is trending down today. Avoid nephrotoxic meds    Hemiparesis affecting right side: residual effect of CVA. Continue on statin, plavix      DVT prophylaxis: lovenox  Code Status: full  Family Communication: discussed pt's care w/ pt's daughter, James Guiles and answered her questions  Disposition Plan: likely d/c to SNF   Level of care: Med-Surg  Status is: Inpatient Remains inpatient appropriate because: needs SNF placement & insurance auth     Consultants:  Podiatry   Procedures:   Antimicrobials: po  vanco    Subjective: Pt c/o malaise   Objective: Vitals:   10/30/21 1233 10/30/21 1607 10/30/21 2049 10/31/21 0526  BP: (!) 128/54 116/69 129/60 118/61  Pulse: 70 74 80 77  Resp: 16 17 20 20   Temp: 98.2 F (36.8 C) 98.3 F (36.8 C) 98.6 F (37 C) 98.3 F (36.8 C)  TempSrc:   Oral   SpO2: 98% 99% 100% 95%  Weight:      Height:        Intake/Output Summary (Last 24 hours) at 10/31/2021 0730 Last data filed at 10/31/2021 0531 Gross per 24 hour  Intake --  Output 1400 ml  Net -1400 ml    Filed Weights   10/25/21 1703  Weight: 88.9 kg    Examination:  General exam: appears comfortable   Respiratory system: clear breath sounds b/l  Cardiovascular system: S1/S2+. No rubs or clicks  Gastrointestinal system: abd is soft, NT, ND & hyperactive bowel sounds  Central nervous system: alert and oriented. Moves all extremities  Psychiatry: judgement and insight appears normal. Flat mood and affect     Data Reviewed: I have personally reviewed following labs and imaging studies  CBC: Recent Labs  Lab 10/27/21 0326 10/28/21 0619 10/29/21 0433 10/30/21 0410 10/31/21 0454  WBC 13.9* 10.4 9.1 9.4 9.9  HGB 12.4* 12.5* 12.4* 12.1* 12.8*  HCT 38.3* 38.2* 37.9* 37.1* 39.1  MCV 91.0 91.0 90.5 89.0 89.3  PLT 178 187 229 219 101   Basic Metabolic Panel: Recent Labs  Lab 10/27/21 0326 10/28/21 0619 10/29/21 0433 10/30/21 0410 10/31/21 0454  NA 136  135 137 135 134*  K 4.4 3.7 3.7 3.9 3.8  CL 107 106 107 105 100  CO2 25 22 22 23 23   GLUCOSE 148* 93 108* 117* 179*  BUN 17 18 17 18 18   CREATININE 1.50* 1.22 1.29* 1.36* 1.17  CALCIUM 7.8* 7.9* 8.4* 8.3* 8.3*   GFR: Estimated Creatinine Clearance: 47.3 mL/min (by C-G formula based on SCr of 1.17 mg/dL). Liver Function Tests: No results for input(s): AST, ALT, ALKPHOS, BILITOT, PROT, ALBUMIN in the last 168 hours. No results for input(s): LIPASE, AMYLASE in the last 168 hours. No results for input(s): AMMONIA in the  last 168 hours. Coagulation Profile: No results for input(s): INR, PROTIME in the last 168 hours. Cardiac Enzymes: No results for input(s): CKTOTAL, CKMB, CKMBINDEX, TROPONINI in the last 168 hours. BNP (last 3 results) No results for input(s): PROBNP in the last 8760 hours. HbA1C: No results for input(s): HGBA1C in the last 72 hours.  CBG: Recent Labs  Lab 10/29/21 2125 10/30/21 0809 10/30/21 1148 10/30/21 1608 10/30/21 2046  GLUCAP 155* 71 149* 104* 184*   Lipid Profile: No results for input(s): CHOL, HDL, LDLCALC, TRIG, CHOLHDL, LDLDIRECT in the last 72 hours. Thyroid Function Tests: No results for input(s): TSH, T4TOTAL, FREET4, T3FREE, THYROIDAB in the last 72 hours. Anemia Panel: Recent Labs    10/29/21 0433  TIBC 153*  IRON 48   Sepsis Labs: Recent Labs  Lab 10/25/21 2232 10/26/21 0049  LATICACIDVEN 1.4 1.8    Recent Results (from the past 240 hour(s))  Resp Panel by RT-PCR (Flu A&B, Covid) Nasopharyngeal Swab     Status: None   Collection Time: 10/25/21 10:32 PM   Specimen: Nasopharyngeal Swab; Nasopharyngeal(NP) swabs in vial transport medium  Result Value Ref Range Status   SARS Coronavirus 2 by RT PCR NEGATIVE NEGATIVE Final    Comment: (NOTE) SARS-CoV-2 target nucleic acids are NOT DETECTED.  The SARS-CoV-2 RNA is generally detectable in upper respiratory specimens during the acute phase of infection. The lowest concentration of SARS-CoV-2 viral copies this assay can detect is 138 copies/mL. A negative result does not preclude SARS-Cov-2 infection and should not be used as the sole basis for treatment or other patient management decisions. A negative result may occur with  improper specimen collection/handling, submission of specimen other than nasopharyngeal swab, presence of viral mutation(s) within the areas targeted by this assay, and inadequate number of viral copies(<138 copies/mL). A negative result must be combined with clinical  observations, patient history, and epidemiological information. The expected result is Negative.  Fact Sheet for Patients:  EntrepreneurPulse.com.au  Fact Sheet for Healthcare Providers:  IncredibleEmployment.be  This test is no t yet approved or cleared by the [place] FDA and  has been authorized for detection and/or diagnosis of SARS-CoV-2 by FDA under an Emergency Use Authorization (EUA). This EUA will remain  in effect (meaning this test can be used) for the duration of the COVID-19 declaration under Section 564(b)(1) of the Act, 21 U.S.C.section 360bbb-3(b)(1), unless the authorization is terminated  or revoked sooner.       Influenza A by PCR NEGATIVE NEGATIVE Final   Influenza B by PCR NEGATIVE NEGATIVE Final    Comment: (NOTE) The Xpert Xpress SARS-CoV-2/FLU/RSV plus assay is intended as an aid in the diagnosis of influenza from Nasopharyngeal swab specimens and should not be used as a sole basis for treatment. Nasal washings and aspirates are unacceptable for Xpert Xpress SARS-CoV-2/FLU/RSV testing.  Fact Sheet for Patients: EntrepreneurPulse.com.au  Fact  Sheet for Healthcare Providers: IncredibleEmployment.be  This test is not yet approved or cleared by the [place] and has been authorized for detection and/or diagnosis of SARS-CoV-2 by FDA under an Emergency Use Authorization (EUA). This EUA will remain in effect (meaning this test can be used) for the duration of the COVID-19 declaration under Section 564(b)(1) of the Act, 21 U.S.C. section 360bbb-3(b)(1), unless the authorization is terminated or revoked.  Performed at [facility], [address] [place], [place] [address]   Culture, blood (routine x 2)     Status: Abnormal   Collection Time: 10/25/21 10:33 PM   Specimen: BLOOD  Result Value Ref Range Status   Specimen Description   Final    BLOOD LEFT  ANTECUBITAL Performed at [facility], [place] [address], [place], [place] [address]    Special Requests   Final    BOTTLES DRAWN AEROBIC AND ANAEROBIC Blood Culture adequate volume Performed at [facility], [address] [place], [place] [address]    Culture  Setup Time   Final    GRAM POSITIVE COCCI Organism ID to follow CRITICAL RESULT CALLED TO, READ BACK BY AND VERIFIED WITH: ABBY ELLINGTON @1438  10/26/21 SCS IN BOTH AEROBIC AND ANAEROBIC BOTTLES Performed at [facility], [address] [place], [place] [address]    Culture (A)  Final    STAPHYLOCOCCUS EPIDERMIDIS THE SIGNIFICANCE OF ISOLATING THIS ORGANISM FROM A SINGLE SET OF BLOOD CULTURES WHEN MULTIPLE SETS ARE DRAWN IS UNCERTAIN. PLEASE NOTIFY THE MICROBIOLOGY DEPARTMENT WITHIN ONE WEEK IF SPECIATION AND SENSITIVITIES ARE REQUIRED. Performed at [facility], [place] [address], [place], [place] [address]    Report Status 10/28/2021 FINAL  Final  Blood Culture ID Panel (Reflexed)     Status: Abnormal   Collection Time: 10/25/21 10:33 PM  Result Value Ref Range Status   Enterococcus faecalis NOT DETECTED NOT DETECTED Final   Enterococcus Faecium NOT DETECTED NOT DETECTED Final   Listeria monocytogenes NOT DETECTED NOT DETECTED Final   Staphylococcus species DETECTED (A) NOT DETECTED Final    Comment: CRITICAL RESULT CALLED TO, READ BACK BY AND VERIFIED WITH: ABBY ELLINGTON @1438  10/26/21 SCS    Staphylococcus aureus (BCID) NOT DETECTED NOT DETECTED Final   Staphylococcus epidermidis DETECTED (A) NOT DETECTED Final    Comment: Methicillin (oxacillin) resistant coagulase negative staphylococcus. Possible blood culture contaminant (unless isolated from more than one blood culture draw or clinical case suggests pathogenicity). No antibiotic treatment is indicated for blood  culture contaminants. CRITICAL RESULT CALLED TO, READ BACK BY AND VERIFIED WITH: ABBY ELLINGTON @1438  10/26/21 SCS     Staphylococcus lugdunensis NOT DETECTED NOT DETECTED Final   Streptococcus species NOT DETECTED NOT DETECTED Final   Streptococcus agalactiae NOT DETECTED NOT DETECTED Final   Streptococcus pneumoniae NOT DETECTED NOT DETECTED Final   Streptococcus pyogenes NOT DETECTED NOT DETECTED Final   A.calcoaceticus-baumannii NOT DETECTED NOT DETECTED Final   Bacteroides fragilis NOT DETECTED NOT DETECTED Final   Enterobacterales NOT DETECTED NOT DETECTED Final   Enterobacter cloacae complex NOT DETECTED NOT DETECTED Final   Escherichia coli NOT DETECTED NOT DETECTED Final   Klebsiella aerogenes NOT DETECTED NOT DETECTED Final   Klebsiella oxytoca NOT DETECTED NOT DETECTED Final   Klebsiella pneumoniae NOT DETECTED NOT DETECTED Final   Proteus species NOT DETECTED NOT DETECTED Final   Salmonella species NOT DETECTED NOT DETECTED Final   Serratia marcescens NOT DETECTED NOT DETECTED Final   Haemophilus influenzae NOT DETECTED  NOT DETECTED Final   Neisseria meningitidis NOT DETECTED NOT DETECTED Final   Pseudomonas aeruginosa NOT DETECTED NOT DETECTED Final   Stenotrophomonas maltophilia NOT DETECTED NOT DETECTED Final   Candida albicans NOT DETECTED NOT DETECTED Final   Candida auris NOT DETECTED NOT DETECTED Final   Candida glabrata NOT DETECTED NOT DETECTED Final   Candida krusei NOT DETECTED NOT DETECTED Final   Candida parapsilosis NOT DETECTED NOT DETECTED Final   Candida tropicalis NOT DETECTED NOT DETECTED Final   Cryptococcus neoformans/gattii NOT DETECTED NOT DETECTED Final   Methicillin resistance mecA/C DETECTED (A) NOT DETECTED Final    Comment: CRITICAL RESULT CALLED TO, READ BACK BY AND VERIFIED WITH: ABBY ELLINGTON @1438  10/26/21 SCS Performed at [facility], [address] [place], [place] [address]   Culture, blood (Routine X 2) w Reflex to ID Panel     Status: None   Collection Time: 10/26/21 12:49 AM   Specimen: BLOOD  Result Value Ref Range Status   Specimen  Description BLOOD RIGHT HAND  Final   Special Requests   Final    BOTTLES DRAWN AEROBIC ONLY Blood Culture results may not be optimal due to an inadequate volume of blood received in culture bottles   Culture   Final    NO GROWTH 5 DAYS Performed at [facility], [address] [place], [place] [address]    Report Status 10/31/2021 FINAL  Final  Urine Culture     Status: Abnormal   Collection Time: 10/26/21  3:31 AM   Specimen: Urine, Random  Result Value Ref Range Status   Specimen Description   Final    URINE, RANDOM Performed at [facility], [address] [place], [place] [address]    Special Requests   Final    NONE Performed at [facility], [address] [place], [place] [address]    Culture 30,000 COLONIES/mL ENTEROBACTER AEROGENES (A)  Final   Report Status 10/29/2021 FINAL  Final   Organism ID, Bacteria ENTEROBACTER AEROGENES (A)  Final      Susceptibility   Enterobacter aerogenes - MIC*    CEFAZOLIN >=64 RESISTANT Resistant     CEFEPIME <=0.12 SENSITIVE Sensitive     CEFTRIAXONE <=0.25 SENSITIVE Sensitive     CIPROFLOXACIN <=0.25 SENSITIVE Sensitive     GENTAMICIN <=1 SENSITIVE Sensitive     IMIPENEM 2 SENSITIVE Sensitive     NITROFURANTOIN 32 SENSITIVE Sensitive     TRIMETH/SULFA <=20 SENSITIVE Sensitive     PIP/TAZO <=4 SENSITIVE Sensitive     * 30,000 COLONIES/mL ENTEROBACTER AEROGENES  CULTURE, BLOOD (ROUTINE X 2) w Reflex to ID Panel     Status: None (Preliminary result)   Collection Time: 10/27/21  8:16 AM   Specimen: BLOOD RIGHT HAND  Result Value Ref Range Status   Specimen Description BLOOD RIGHT HAND  Final   Special Requests   Final    BOTTLES DRAWN AEROBIC AND ANAEROBIC Blood Culture adequate volume   Culture   Final    NO GROWTH 4 DAYS Performed at [facility], [address] [place], [place] [address]    Report Status PENDING  Incomplete  CULTURE, BLOOD (ROUTINE X 2) w Reflex to ID Panel     Status:  None (Preliminary result)   Collection Time: 10/27/21  8:23 AM   Specimen: BLOOD LEFT HAND  Result Value Ref Range Status   Specimen Description BLOOD LEFT HAND  Final   Special Requests   Final  BOTTLES DRAWN AEROBIC ONLY Blood Culture results may not be optimal due to an inadequate volume of blood received in culture bottles   Culture   Final    NO GROWTH 4 DAYS Performed at [facility], [address] [place], [place] [address]    Report Status PENDING  Incomplete  MRSA Next Gen by PCR, Nasal     Status: Abnormal   Collection Time: 10/27/21  9:42 AM   Specimen: Nasal Mucosa; Nasal Swab  Result Value Ref Range Status   MRSA by PCR Next Gen DETECTED (A) NOT DETECTED Final    Comment: RESULT CALLED TO, READ BACK BY AND VERIFIED WITH: ANGELA HESTER 10/27/21 1055 MW (NOTE) The GeneXpert MRSA Assay (FDA approved for NASAL specimens only), is one component of a comprehensive MRSA colonization surveillance program. It is not intended to diagnose MRSA infection nor to guide or monitor treatment for MRSA infections. Test performance is not FDA approved in patients less than 34 years old. Performed at [facility], [address] [place], [place] [address]   Gastrointestinal Panel by PCR , Stool     Status: None   Collection Time: 10/28/21  5:15 AM   Specimen: Stool  Result Value Ref Range Status   Campylobacter species NOT DETECTED NOT DETECTED Final   Plesimonas shigelloides NOT DETECTED NOT DETECTED Final   Salmonella species NOT DETECTED NOT DETECTED Final   Yersinia enterocolitica NOT DETECTED NOT DETECTED Final   Vibrio species NOT DETECTED NOT DETECTED Final   Vibrio cholerae NOT DETECTED NOT DETECTED Final   Enteroaggregative E coli (EAEC) NOT DETECTED NOT DETECTED Final   Enteropathogenic E coli (EPEC) NOT DETECTED NOT DETECTED Final   Enterotoxigenic E coli (ETEC) NOT DETECTED NOT DETECTED Final   Shiga like toxin producing E coli (STEC) NOT DETECTED  NOT DETECTED Final   Shigella/Enteroinvasive E coli (EIEC) NOT DETECTED NOT DETECTED Final   Cryptosporidium NOT DETECTED NOT DETECTED Final   Cyclospora cayetanensis NOT DETECTED NOT DETECTED Final   Entamoeba histolytica NOT DETECTED NOT DETECTED Final   Giardia lamblia NOT DETECTED NOT DETECTED Final   Adenovirus F40/41 NOT DETECTED NOT DETECTED Final   Astrovirus NOT DETECTED NOT DETECTED Final   Norovirus GI/GII NOT DETECTED NOT DETECTED Final   Rotavirus A NOT DETECTED NOT DETECTED Final   Sapovirus (I, II, IV, and V) NOT DETECTED NOT DETECTED Final    Comment: Performed at [facility], [address] [place], [name] [address]  C Difficile Quick Screen w PCR reflex     Status: Abnormal   Collection Time: 10/28/21  5:15 AM   Specimen: STOOL  Result Value Ref Range Status   C Diff antigen POSITIVE (A) NEGATIVE Final   C Diff toxin POSITIVE (A) NEGATIVE Final   C Diff interpretation Toxin producing C. difficile detected.  Final    Comment: CRITICAL RESULT CALLED TO, READ BACK BY AND VERIFIED WITH: ADEYEMI AJIBADE AT 2683 10/28/21.PMF Performed at [facility], [address] [place], [place] [address]          Radiology Studies: No results found.      Scheduled Meds:  clopidogrel  75 mg Oral Daily   DULoxetine  60 mg Oral Daily   gabapentin  600 mg Oral BID   insulin aspart  0-15 Units Subcutaneous TID WC   insulin aspart  0-5 Units Subcutaneous QHS   insulin glargine-yfgn  20 Units Subcutaneous QHS   levothyroxine  50 mcg Oral Q0600  melatonin  5 mg Oral QHS   pantoprazole  40 mg Oral Q breakfast   pramipexole  1 mg Oral q1600   tamsulosin  0.4 mg Oral QPC breakfast   vancomycin  125 mg Oral QID   Continuous Infusions:     LOS: 6 days    Time spent: 15 mins     Wyvonnia Dusky, MD [facility] Pager [phone]  If 7PM-7AM, please contact night-coverage 10/31/2021, 7:30 AM

## 2021-10-31 NOTE — Progress Notes (Signed)
Physical Therapy Treatment Patient Details Name: James PARAGAS, MD MRN: [id number] DOB: 05/28/1931 Today's Date: 10/31/2021   History of Present Illness Pt is an 88 y.o. male presenting to hospital 2/7 with c/o weakness and fall (was weak anfd fell into the wall and slid to floor).  Pt admitted with postural dizziness with presyncope, cellulitis of L foot, infected abrasion of L heel (friction burn secondary to RLS), diarrhea, and frequent UTI.  PMH includes h/o CVA with R sided hemiplegia, CKD, BPH, anemia, HLD, OSA on CPAP, DM, CKD, h/o hiatal hernia, OSA on CPAP, RLS, hearing loss R ear, and CEA.    PT Comments    Pt ready for session.  To EOB with rail and min a x 1.  Stood with min a x 1 and transfer to recliner.  Standing ex x 10 for LE ex with seated rest break.  2 x  sit to stand with light min a x 1.  Pt is able to step 3 steps forward and back x 2 with AFO donned.  Overall progressing well and fatigued with activity but remains in recliner after session.   Recommendations for follow up therapy are one component of a multi-disciplinary discharge planning process, led by the attending physician.  Recommendations may be updated based on patient status, additional functional criteria and insurance authorization.  Follow Up Recommendations  Skilled nursing-short term rehab (<3 hours/day)     Assistance Recommended at Discharge Frequent or constant Supervision/Assistance  Patient can return home with the following A little help with walking and/or transfers;A little help with bathing/dressing/bathroom;Assistance with cooking/housework;Assistance with feeding;Direct supervision/assist for medications management;Direct supervision/assist for financial management;Assist for transportation;Help with stairs or ramp for entrance   Equipment Recommendations  Rolling walker (2 wheels);BSC/3in1;Wheelchair (measurements PT);Wheelchair cushion (measurements PT);Hospital bed    Recommendations  for Other Services       Precautions / Restrictions Precautions Precautions: Fall Restrictions Weight Bearing Restrictions: No     Mobility  Bed Mobility Overal bed mobility: Needs Assistance Bed Mobility: Supine to Sit     Supine to sit: Min guard     General bed mobility comments: uses rail and increased time but able to do on    Transfers Overall transfer level: Needs assistance Equipment used: Rolling walker (2 wheels) Transfers: Sit to/from Stand Sit to Stand: Min assist, From elevated surface   Step pivot transfers: Min assist       General transfer comment: does well with time with light assist    Ambulation/Gait Ambulation/Gait assistance: Min assist Gait Distance (Feet): 10 Feet Assistive device: Rolling walker (2 wheels) Gait Pattern/deviations: Step-to pattern, Trunk flexed, Shuffle Gait velocity: decreased     General Gait Details: to chair then is able to step forward and back from chair.  difficulty with backwards gait   Stairs             Wheelchair Mobility    Modified Rankin (Stroke Patients Only)       Balance Overall balance assessment: Needs assistance Sitting-balance support: No upper extremity supported, Feet supported Sitting balance-Leahy Scale: Good Sitting balance - Comments: steady sitting reaching within BOS   Standing balance support: Bilateral upper extremity supported, During functional activity, Reliant on assistive device for balance Standing balance-Leahy Scale: Fair                              Cognition Arousal/Alertness: Awake/alert Behavior During Therapy: WFL for tasks assessed/performed Overall  Cognitive Status: Within Functional Limits for tasks assessed                                          Exercises Other Exercises Other Exercises: standing AROM x 10, sit to stand 5 x 2    General Comments        Pertinent Vitals/Pain Pain Assessment Pain Assessment:  No/denies pain    Home Living                          Prior Function            PT Goals (current goals can now be found in the care plan section) Progress towards PT goals: Progressing toward goals    Frequency    Min 2X/week      PT Plan Current plan remains appropriate    Co-evaluation              AM-PAC PT "6 Clicks" Mobility   Outcome Measure  Help needed turning from your back to your side while in a flat bed without using bedrails?: None Help needed moving from lying on your back to sitting on the side of a flat bed without using bedrails?: A Little Help needed moving to and from a bed to a chair (including a wheelchair)?: A Little Help needed standing up from a chair using your arms (e.g., wheelchair or bedside chair)?: A Little Help needed to walk in hospital room?: A Little Help needed climbing 3-5 steps with a railing? : A Lot 6 Click Score: 18    End of Session Equipment Utilized During Treatment: Gait belt Activity Tolerance: Patient tolerated treatment well Patient left: in chair;with call bell/phone within reach;with chair alarm set;Other (comment) Nurse Communication: Mobility status;Precautions;Weight bearing status PT Visit Diagnosis: Other abnormalities of gait and mobility (R26.89);Muscle weakness (generalized) (M62.81);Difficulty in walking, not elsewhere classified (R26.2)     Time: 8127-5170 PT Time Calculation (min) (ACUTE ONLY): 24 min  Charges:  $Therapeutic Exercise: 8-22 mins $Therapeutic Activity: 8-22 mins                    Chesley Noon, PTA 10/31/21, 3:52 PM

## 2021-11-01 DIAGNOSIS — R531 Weakness: Secondary | ICD-10-CM

## 2021-11-01 DIAGNOSIS — Z8619 Personal history of other infectious and parasitic diseases: Secondary | ICD-10-CM

## 2021-11-01 DIAGNOSIS — A0472 Enterocolitis due to Clostridium difficile, not specified as recurrent: Secondary | ICD-10-CM

## 2021-11-01 LAB — BASIC METABOLIC PANEL
Anion gap: 7 (ref 5–15)
BUN: 20 mg/dL (ref 8–23)
CO2: 25 mmol/L (ref 22–32)
Calcium: 8.4 mg/dL — ABNORMAL LOW (ref 8.9–10.3)
Chloride: 104 mmol/L (ref 98–111)
Creatinine, Ser: 1.24 mg/dL (ref 0.61–1.24)
GFR, Estimated: 56 mL/min — ABNORMAL LOW (ref >60)
Glucose, Bld: 136 mg/dL — ABNORMAL HIGH (ref 70–99)
Potassium: 3.8 mmol/L (ref 3.5–5.1)
Sodium: 136 mmol/L (ref 135–145)

## 2021-11-01 LAB — CULTURE, BLOOD (ROUTINE X 2)
Culture: NO GROWTH
Culture: NO GROWTH
Special Requests: ADEQUATE

## 2021-11-01 LAB — GLUCOSE, CAPILLARY
Glucose-Capillary: 139 mg/dL — ABNORMAL HIGH (ref 70–99)
Glucose-Capillary: 147 mg/dL — ABNORMAL HIGH (ref 70–99)
Glucose-Capillary: 139 mg/dL — ABNORMAL HIGH (ref 70–99)
Glucose-Capillary: 185 mg/dL — ABNORMAL HIGH (ref 70–99)

## 2021-11-01 LAB — CBC
HCT: 39.2 % (ref 39.0–52.0)
Hemoglobin: 13 g/dL (ref 13.0–17.0)
MCH: 29.5 pg (ref 26.0–34.0)
MCHC: 33.2 g/dL (ref 30.0–36.0)
MCV: 88.9 fL (ref 80.0–100.0)
Platelets: 226 10*3/uL (ref 150–400)
RBC: 4.41 MIL/uL (ref 4.22–5.81)
RDW: 13.5 % (ref 11.5–15.5)
WBC: 8.3 10*3/uL (ref 4.0–10.5)
nRBC: 0 % (ref 0.0–0.2)

## 2021-11-01 NOTE — TOC Progression Note (Addendum)
Transition of Care [facility]) - Progression Note    Patient Details  Name: James DESHMUKH, MD MRN: [id number] Date of Birth: 01-23-1931  Transition of Care [facility]) CM/SW Lucien, RN Phone Number: 11/01/2021, 12:55 PM  Clinical Narrative:   Spoke with the patient's daughter on the phone, He has been to [facility] in the past,  She stated that he has been in [facility] previously and does not want to return, He has been to [facility] previously and did not get therapy on the weekends He has not been to [facility] She lives in [place] and is willing to go to [place] area if needed  I reached out to [place] at [facility] I left a voice mail and requested a bed I reached out thru the hub to [place] area facilities and requested a bed  I called [facility] in [place] spoke with Legrand Como  she requested that I fax the information to [phone], faxed information   Expected Discharge Plan: [place] Barriers to Discharge: Continued Medical Work up  Expected Discharge Plan and Services Expected Discharge Plan: [place]   Discharge Planning Services: CM Consult   Living arrangements for the past 2 months: [place]                                       Social Determinants of Health (SDOH) Interventions    Readmission Risk Interventions No flowsheet data found.

## 2021-11-01 NOTE — Progress Notes (Signed)
Physical Therapy Treatment Patient Details Name: James DUNG, MD MRN: [id number] DOB: 1931/07/13 Today's Date: 11/01/2021   History of Present Illness Pt is an 88 y.o. male presenting to hospital 2/7 with c/o weakness and fall (was weak anfd fell into the wall and slid to floor).  Pt admitted with postural dizziness with presyncope, cellulitis of L foot, infected abrasion of L heel (friction burn secondary to RLS), diarrhea, and frequent UTI.  PMH includes h/o CVA with R sided hemiplegia, CKD, BPH, anemia, HLD, OSA on CPAP, DM, CKD, h/o hiatal hernia, OSA on CPAP, RLS, hearing loss R ear, and CEA.    PT Comments    Good tolerance for mobility this pm. Pt completed strengthening of B LE's in sitting and gait training in room with support of RW 71ft with CGA for safety. Pt appears to be regaining strength and may only need a short stay at SNF prior to returning home.    Recommendations for follow up therapy are one component of a multi-disciplinary discharge planning process, led by the attending physician.  Recommendations may be updated based on patient status, additional functional criteria and insurance authorization.  Follow Up Recommendations  Skilled nursing-short term rehab (<3 hours/day)     Assistance Recommended at Discharge Frequent or constant Supervision/Assistance  Patient can return home with the following A little help with walking and/or transfers;A little help with bathing/dressing/bathroom;Assistance with cooking/housework;Assistance with feeding;Direct supervision/assist for medications management;Direct supervision/assist for financial management;Assist for transportation;Help with stairs or ramp for entrance   Equipment Recommendations  Rolling walker (2 wheels);BSC/3in1;Wheelchair (measurements PT);Wheelchair cushion (measurements PT);Hospital bed    Recommendations for Other Services OT consult     Precautions / Restrictions Precautions Precautions:  Fall Restrictions Weight Bearing Restrictions: No     Mobility  Bed Mobility                    Transfers Overall transfer level: Needs assistance Equipment used: Rolling walker (2 wheels) Transfers: Sit to/from Stand Sit to Stand: Min assist, Mod assist (from low recliner)           General transfer comment:  (MinA to maintain initial standing balance at RW)    Ambulation/Gait Ambulation/Gait assistance: Min assist Gait Distance (Feet):  (35) Assistive device: Rolling walker (2 wheels) Gait Pattern/deviations: Step-to pattern, Trunk flexed (Manual assist to prevent right knee buckling)       General Gait Details:  (Pt ambulated in room since unable to ambulate in halls at this time)   Chief Strategy Officer    Modified Rankin (Stroke Patients Only)       Balance Overall balance assessment: Needs assistance Sitting-balance support: No upper extremity supported, Feet supported Sitting balance-Leahy Scale: Good Sitting balance - Comments: steady sitting reaching within BOS   Standing balance support: Bilateral upper extremity supported (initial MinA to prevent R lateral LOB in standing) Standing balance-Leahy Scale: Fair Standing balance comment:  (once balance regained, pt steady at walker)                            Cognition Arousal/Alertness: Awake/alert Behavior During Therapy: WFL for tasks assessed/performed Overall Cognitive Status: Within Functional Limits for tasks assessed                                 General  Comments: Pt is A and O x 4. has flat affect but cooperative. slow moving throughout        Exercises General Exercises - Lower Extremity Ankle Circles/Pumps: AROM, Right, AAROM, Left, 10 reps Long Arc Quad: AROM, Strengthening, Both, 10 reps, Seated Hip Flexion/Marching: AROM, Strengthening, Both, 10 reps, Seated    General Comments        Pertinent Vitals/Pain Pain  Assessment Pain Assessment: No/denies pain    Home Living                          Prior Function            PT Goals (current goals can now be found in the care plan section) Acute Rehab PT Goals Patient Stated Goal: get stronger so I can return to [facility] Progress towards PT goals: Progressing toward goals    Frequency    Min 2X/week      PT Plan Current plan remains appropriate    Co-evaluation              AM-PAC PT "6 Clicks" Mobility   Outcome Measure  Help needed turning from your back to your side while in a flat bed without using bedrails?: None Help needed moving from lying on your back to sitting on the side of a flat bed without using bedrails?: A Little Help needed moving to and from a bed to a chair (including a wheelchair)?: A Little Help needed standing up from a chair using your arms (e.g., wheelchair or bedside chair)?: A Little Help needed to walk in hospital room?: A Little Help needed climbing 3-5 steps with a railing? : A Lot 6 Click Score: 18    End of Session Equipment Utilized During Treatment: Gait belt Activity Tolerance: Patient tolerated treatment well Patient left: in chair;with call bell/phone within reach;with chair alarm set;Other (comment) Nurse Communication: Mobility status;Precautions;Weight bearing status PT Visit Diagnosis: Other abnormalities of gait and mobility (R26.89);Muscle weakness (generalized) (M62.81);Difficulty in walking, not elsewhere classified (R26.2)     Time: 9758-8325 PT Time Calculation (min) (ACUTE ONLY): 15 min  Charges:  $Therapeutic Activity: 8-22 mins                    Mikel Cella, PTA    [name] 11/01/2021, 3:41 PM

## 2021-11-01 NOTE — Progress Notes (Addendum)
PROGRESS NOTE   HPI was taken from Dr. 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  Patient states he fell against the wall and was able to slide down to the floor.  He did not hit his head or sustain any injury.  He was concerned he might have a urinary tract infection as his doctor had treated him recently for a UTI.  Reports having diarrhea for the last few weeks, about 2 soft bowel movements daily.  His daughter who is at the bedside also contributes to the history he mentioned an area on his left heel which has been increasingly painful and red.  Patient has severe restless leg syndrome that is managed by neurology, and he has been rubbing his left heel uncontrollably on the bed causing a wound to develop.  He denies nausea, vomiting, cough, shortness of breath or chest pain.   ED course: Tmax 100.3 with otherwise normal vitals on arrival Blood work: WBC 12,000 Blood glucose 302.  Creatinine at baseline at 1.35   EKG, personally viewed and interpreted: NSR at 86 with no acute ST-T wave changes   Imaging: CT head with all left basal ganglia lacunar infarct Chest x-ray no acute process Left foot x-ray.  No acute bony abnormality   Patient started on clindamycin and given an IV fluid bolus.  Hospitalist consulted for admission.     Hospital course from Dr. Jimmye Norman 2/8-2/14/23: Pt presented w/ diarrhea which found to be secondary to c.diff. Pt is currently on po vanco which diarrhea improving slowly. Of note, pt was found to have cellulitis of left foot and left heel abrasion for which he received and completed abx course. No debridement was needed as per podiatry. PT/OT evaluated the pt and recs SNF. CM gave the pt's daughter 2 SNF options but she  declined them both and asked for CM to search for other facilities.    James Spurr, MD  AVW:[id number] DOB: 1930-12-23 DOA: 10/25/2021 PCP: [name], MD   Assessment & Plan:   Principal Problem:   Postural dizziness with presyncope Active Problems:   Hemiparesis affecting right side as late effect of cerebrovascular accident (CVA) ([place])   Uncontrolled type 2 diabetes mellitus with hyperglycemia, with long-term current use of insulin (H[facility])   Stage 3a chronic kidney disease ([facility])   Restless leg syndrome, severe   HTN (hypertension)   Frequent UTI   Abrasion of heel, infected, left, initial encounter   Cellulitis of left foot   Diarrhea  C. diff: likely cause of the diarrhea. Continue on po vanco. Continue on contact/enteric precautions   Generalized weakness: PT/OT recs SNF   Leukocytosis: resolved   Postural dizziness: w/ presyncope. Etiology unclear. Resolved    Cellulitis of left foot: no debridement needed as per podiatry. Completed abx course  Left infected abrasion of heel: improving. Friction burn left heel secondary to restless leg syndrome. Completed abx course   Unlikely bacteremia: blood cx growing stap epi, likely containment. Repeated blood cxs NGTD, so no bacteremia    DM2: fairly well controlled, HbA1c 7.2. Continue on glargine, SSI w/ accuchecks   Frequent UTIs: last UTI 08/2021 & treated.  Urine cx growing enterobacter approx 30,000 colonies/ml. Completed abx course    Restless leg  syndrome: continue on home dose of pramipexole   CKDIIIa: Cr is labile. Avoid nephrotoxic meds    Hemiparesis affecting right side: residual effect of CVA. Continue on plavix, statin       DVT prophylaxis: lovenox  Code Status: full  Family Communication: discussed pt's care w/ pt's daughter, Wells Guiles and answered her questions  Disposition Plan: likely d/c to SNF   Level of care: Med-Surg  Status is: Inpatient Remains inpatient appropriate because: needs  SNF placement & insurance auth     Consultants:  Podiatry   Procedures:   Antimicrobials: po vanco    Subjective: Pt c/o fatigue   Objective: Vitals:   10/31/21 0755 10/31/21 1546 10/31/21 1943 11/01/21 0521  BP: 126/64 (!) 134/57 122/66 (!) 107/55  Pulse: 75 82 80 63  Resp: 18 18 20  (!) 21  Temp: 98.1 F (36.7 C) 98.9 F (37.2 C) 98.3 F (36.8 C) 97.9 F (36.6 C)  TempSrc:      SpO2: 95% 97% 100% 97%  Weight:      Height:       No intake or output data in the 24 hours ending 11/01/21 0742   Filed Weights   10/25/21 1703  Weight: 88.9 kg    Examination:  General exam: appears calm & comfortable    Respiratory system: clear breath sounds b/l. No wheezes  Cardiovascular system: S1 & S2+. No gallops or rubs  Gastrointestinal system: Abd is soft, ND, NT & hyperactive bowel sounds  Central nervous system: alert and oriented. Moves all extremities  Psychiatry: judgement and insight appears normal. Flat mood and affect     Data Reviewed: I have personally reviewed following labs and imaging studies  CBC: Recent Labs  Lab 10/27/21 0326 10/28/21 0619 10/29/21 0433 10/30/21 0410 10/31/21 0454  WBC 13.9* 10.4 9.1 9.4 9.9  HGB 12.4* 12.5* 12.4* 12.1* 12.8*  HCT 38.3* 38.2* 37.9* 37.1* 39.1  MCV 91.0 91.0 90.5 89.0 89.3  PLT 178 187 229 219 092   Basic Metabolic Panel: Recent Labs  Lab 10/27/21 0326 10/28/21 0619 10/29/21 0433 10/30/21 0410 10/31/21 0454  NA 136 135 137 135 134*  K 4.4 3.7 3.7 3.9 3.8  CL 107 106 107 105 100  CO2 25 22 22 23 23   GLUCOSE 148* 93 108* 117* 179*  BUN 17 18 17 18 18   CREATININE 1.50* 1.22 1.29* 1.36* 1.17  CALCIUM 7.8* 7.9* 8.4* 8.3* 8.3*   GFR: Estimated Creatinine Clearance: 47.3 mL/min (by C-G formula based on SCr of 1.17 mg/dL). Liver Function Tests: No results for input(s): AST, ALT, ALKPHOS, BILITOT, PROT, ALBUMIN in the last 168 hours. No results for input(s): LIPASE, AMYLASE in the last 168 hours. No  results for input(s): AMMONIA in the last 168 hours. Coagulation Profile: No results for input(s): INR, PROTIME in the last 168 hours. Cardiac Enzymes: No results for input(s): CKTOTAL, CKMB, CKMBINDEX, TROPONINI in the last 168 hours. BNP (last 3 results) No results for input(s): PROBNP in the last 8760 hours. HbA1C: No results for input(s): HGBA1C in the last 72 hours.  CBG: Recent Labs  Lab 10/30/21 2046 10/31/21 0807 10/31/21 1218 10/31/21 1610 10/31/21 2157  GLUCAP 184* 154* 122* 181* 128*   Lipid Profile: No results for input(s): CHOL, HDL, LDLCALC, TRIG, CHOLHDL, LDLDIRECT in the last 72 hours. Thyroid Function Tests: No results for input(s): TSH, T4TOTAL, FREET4, T3FREE, THYROIDAB in the last 72 hours. Anemia Panel: No results for input(s): VITAMINB12, FOLATE, FERRITIN, TIBC, IRON, RETICCTPCT in the  last 72 hours.  Sepsis Labs: Recent Labs  Lab 10/25/21 2232 10/26/21 0049  LATICACIDVEN 1.4 1.8    Recent Results (from the past 240 hour(s))  Resp Panel by RT-PCR (Flu A&B, Covid) Nasopharyngeal Swab     Status: None   Collection Time: 10/25/21 10:32 PM   Specimen: Nasopharyngeal Swab; Nasopharyngeal(NP) swabs in vial transport medium  Result Value Ref Range Status   SARS Coronavirus 2 by RT PCR NEGATIVE NEGATIVE Final    Comment: (NOTE) SARS-CoV-2 target nucleic acids are NOT DETECTED.  The SARS-CoV-2 RNA is generally detectable in upper respiratory specimens during the acute phase of infection. The lowest concentration of SARS-CoV-2 viral copies this assay can detect is 138 copies/mL. A negative result does not preclude SARS-Cov-2 infection and should not be used as the sole basis for treatment or other patient management decisions. A negative result may occur with  improper specimen collection/handling, submission of specimen other than nasopharyngeal swab, presence of viral mutation(s) within the areas targeted by this assay, and inadequate number of  viral copies(<138 copies/mL). A negative result must be combined with clinical observations, patient history, and epidemiological information. The expected result is Negative.  Fact Sheet for Patients:  EntrepreneurPulse.com.au  Fact Sheet for Healthcare Providers:  IncredibleEmployment.be  This test is no t yet approved or cleared by the [place] FDA and  has been authorized for detection and/or diagnosis of SARS-CoV-2 by FDA under an Emergency Use Authorization (EUA). This EUA will remain  in effect (meaning this test can be used) for the duration of the COVID-19 declaration under Section 564(b)(1) of the Act, 21 U.S.C.section 360bbb-3(b)(1), unless the authorization is terminated  or revoked sooner.       Influenza A by PCR NEGATIVE NEGATIVE Final   Influenza B by PCR NEGATIVE NEGATIVE Final    Comment: (NOTE) The Xpert Xpress SARS-CoV-2/FLU/RSV plus assay is intended as an aid in the diagnosis of influenza from Nasopharyngeal swab specimens and should not be used as a sole basis for treatment. Nasal washings and aspirates are unacceptable for Xpert Xpress SARS-CoV-2/FLU/RSV testing.  Fact Sheet for Patients: EntrepreneurPulse.com.au  Fact Sheet for Healthcare Providers: IncredibleEmployment.be  This test is not yet approved or cleared by the [place] FDA and has been authorized for detection and/or diagnosis of SARS-CoV-2 by FDA under an Emergency Use Authorization (EUA). This EUA will remain in effect (meaning this test can be used) for the duration of the COVID-19 declaration under Section 564(b)(1) of the Act, 21 U.S.C. section 360bbb-3(b)(1), unless the authorization is terminated or revoked.  Performed at [facility], [address] [place], [place] [address]   Culture, blood (routine x 2)     Status: Abnormal   Collection Time: 10/25/21 10:33 PM   Specimen: BLOOD   Result Value Ref Range Status   Specimen Description   Final    BLOOD LEFT ANTECUBITAL Performed at [facility], [place] [address], [place], [place] [address]    Special Requests   Final    BOTTLES DRAWN AEROBIC AND ANAEROBIC Blood Culture adequate volume Performed at [facility], [address] [place], [place] [address]    Culture  Setup Time   Final    GRAM POSITIVE COCCI Organism ID to follow CRITICAL RESULT CALLED TO, READ BACK BY AND VERIFIED WITH: ABBY ELLINGTON @1438  10/26/21 SCS IN BOTH AEROBIC AND ANAEROBIC BOTTLES Performed at [facility], [address] [place], [place] [address]    Culture (A)  Final  STAPHYLOCOCCUS EPIDERMIDIS THE SIGNIFICANCE OF ISOLATING THIS ORGANISM FROM A SINGLE SET OF BLOOD CULTURES WHEN MULTIPLE SETS ARE DRAWN IS UNCERTAIN. PLEASE NOTIFY THE MICROBIOLOGY DEPARTMENT WITHIN ONE WEEK IF SPECIATION AND SENSITIVITIES ARE REQUIRED. Performed at [facility], [place] [address], [place], [place] [address]    Report Status 10/28/2021 FINAL  Final  Blood Culture ID Panel (Reflexed)     Status: Abnormal   Collection Time: 10/25/21 10:33 PM  Result Value Ref Range Status   Enterococcus faecalis NOT DETECTED NOT DETECTED Final   Enterococcus Faecium NOT DETECTED NOT DETECTED Final   Listeria monocytogenes NOT DETECTED NOT DETECTED Final   Staphylococcus species DETECTED (A) NOT DETECTED Final    Comment: CRITICAL RESULT CALLED TO, READ BACK BY AND VERIFIED WITH: ABBY ELLINGTON @1438  10/26/21 SCS    Staphylococcus aureus (BCID) NOT DETECTED NOT DETECTED Final   Staphylococcus epidermidis DETECTED (A) NOT DETECTED Final    Comment: Methicillin (oxacillin) resistant coagulase negative staphylococcus. Possible blood culture contaminant (unless isolated from more than one blood culture draw or clinical case suggests pathogenicity). No antibiotic treatment is indicated for blood  culture contaminants. CRITICAL RESULT CALLED TO, READ  BACK BY AND VERIFIED WITH: ABBY ELLINGTON @1438  10/26/21 SCS    Staphylococcus lugdunensis NOT DETECTED NOT DETECTED Final   Streptococcus species NOT DETECTED NOT DETECTED Final   Streptococcus agalactiae NOT DETECTED NOT DETECTED Final   Streptococcus pneumoniae NOT DETECTED NOT DETECTED Final   Streptococcus pyogenes NOT DETECTED NOT DETECTED Final   A.calcoaceticus-baumannii NOT DETECTED NOT DETECTED Final   Bacteroides fragilis NOT DETECTED NOT DETECTED Final   Enterobacterales NOT DETECTED NOT DETECTED Final   Enterobacter cloacae complex NOT DETECTED NOT DETECTED Final   Escherichia coli NOT DETECTED NOT DETECTED Final   Klebsiella aerogenes NOT DETECTED NOT DETECTED Final   Klebsiella oxytoca NOT DETECTED NOT DETECTED Final   Klebsiella pneumoniae NOT DETECTED NOT DETECTED Final   Proteus species NOT DETECTED NOT DETECTED Final   Salmonella species NOT DETECTED NOT DETECTED Final   Serratia marcescens NOT DETECTED NOT DETECTED Final   Haemophilus influenzae NOT DETECTED NOT DETECTED Final   Neisseria meningitidis NOT DETECTED NOT DETECTED Final   Pseudomonas aeruginosa NOT DETECTED NOT DETECTED Final   Stenotrophomonas maltophilia NOT DETECTED NOT DETECTED Final   Candida albicans NOT DETECTED NOT DETECTED Final   Candida auris NOT DETECTED NOT DETECTED Final   Candida glabrata NOT DETECTED NOT DETECTED Final   Candida krusei NOT DETECTED NOT DETECTED Final   Candida parapsilosis NOT DETECTED NOT DETECTED Final   Candida tropicalis NOT DETECTED NOT DETECTED Final   Cryptococcus neoformans/gattii NOT DETECTED NOT DETECTED Final   Methicillin resistance mecA/C DETECTED (A) NOT DETECTED Final    Comment: CRITICAL RESULT CALLED TO, READ BACK BY AND VERIFIED WITH: ABBY ELLINGTON @1438  10/26/21 SCS Performed at [facility], [address] [place], [place] [address]   Culture, blood (Routine X 2) w Reflex to ID Panel     Status: None   Collection Time: 10/26/21 12:49 AM    Specimen: BLOOD  Result Value Ref Range Status   Specimen Description BLOOD RIGHT HAND  Final   Special Requests   Final    BOTTLES DRAWN AEROBIC ONLY Blood Culture results may not be optimal due to an inadequate volume of blood received in culture bottles   Culture   Final    NO GROWTH 5 DAYS Performed at [facility], [address], [place], [place] [address]  Report Status 10/31/2021 FINAL  Final  Urine Culture     Status: Abnormal   Collection Time: 10/26/21  3:31 AM   Specimen: Urine, Random  Result Value Ref Range Status   Specimen Description   Final    URINE, RANDOM Performed at [facility], [address] [place], [place] [address]    Special Requests   Final    NONE Performed at [facility], [address] [place], [place] [address]    Culture 30,000 COLONIES/mL ENTEROBACTER AEROGENES (A)  Final   Report Status 10/29/2021 FINAL  Final   Organism ID, Bacteria ENTEROBACTER AEROGENES (A)  Final      Susceptibility   Enterobacter aerogenes - MIC*    CEFAZOLIN >=64 RESISTANT Resistant     CEFEPIME <=0.12 SENSITIVE Sensitive     CEFTRIAXONE <=0.25 SENSITIVE Sensitive     CIPROFLOXACIN <=0.25 SENSITIVE Sensitive     GENTAMICIN <=1 SENSITIVE Sensitive     IMIPENEM 2 SENSITIVE Sensitive     NITROFURANTOIN 32 SENSITIVE Sensitive     TRIMETH/SULFA <=20 SENSITIVE Sensitive     PIP/TAZO <=4 SENSITIVE Sensitive     * 30,000 COLONIES/mL ENTEROBACTER AEROGENES  CULTURE, BLOOD (ROUTINE X 2) w Reflex to ID Panel     Status: None (Preliminary result)   Collection Time: 10/27/21  8:16 AM   Specimen: BLOOD RIGHT HAND  Result Value Ref Range Status   Specimen Description BLOOD RIGHT HAND  Final   Special Requests   Final    BOTTLES DRAWN AEROBIC AND ANAEROBIC Blood Culture adequate volume   Culture   Final    NO GROWTH 4 DAYS Performed at [facility], [address] [place], [place] [address]    Report Status PENDING  Incomplete   CULTURE, BLOOD (ROUTINE X 2) w Reflex to ID Panel     Status: None (Preliminary result)   Collection Time: 10/27/21  8:23 AM   Specimen: BLOOD LEFT HAND  Result Value Ref Range Status   Specimen Description BLOOD LEFT HAND  Final   Special Requests   Final    BOTTLES DRAWN AEROBIC ONLY Blood Culture results may not be optimal due to an inadequate volume of blood received in culture bottles   Culture   Final    NO GROWTH 4 DAYS Performed at [facility], [address] [place], [place] [address]    Report Status PENDING  Incomplete  MRSA Next Gen by PCR, Nasal     Status: Abnormal   Collection Time: 10/27/21  9:42 AM   Specimen: Nasal Mucosa; Nasal Swab  Result Value Ref Range Status   MRSA by PCR Next Gen DETECTED (A) NOT DETECTED Final    Comment: RESULT CALLED TO, READ BACK BY AND VERIFIED WITH: ANGELA HESTER 10/27/21 1055 MW (NOTE) The GeneXpert MRSA Assay (FDA approved for NASAL specimens only), is one component of a comprehensive MRSA colonization surveillance program. It is not intended to diagnose MRSA infection nor to guide or monitor treatment for MRSA infections. Test performance is not FDA approved in patients less than 81 years old. Performed at [facility], [address] [place],  [address]   Gastrointestinal Panel by PCR , Stool     Status: None   Collection Time: 10/28/21  5:15 AM   Specimen: Stool  Result Value Ref Range Status   Campylobacter species NOT DETECTED NOT DETECTED Final   Plesimonas shigelloides NOT DETECTED NOT DETECTED Final   Salmonella species NOT DETECTED  NOT DETECTED Final   Yersinia enterocolitica NOT DETECTED NOT DETECTED Final   Vibrio species NOT DETECTED NOT DETECTED Final   Vibrio cholerae NOT DETECTED NOT DETECTED Final   Enteroaggregative E coli (EAEC) NOT DETECTED NOT DETECTED Final   Enteropathogenic E coli (EPEC) NOT DETECTED NOT DETECTED Final   Enterotoxigenic E coli (ETEC) NOT DETECTED NOT DETECTED  Final   Shiga like toxin producing E coli (STEC) NOT DETECTED NOT DETECTED Final   Shigella/Enteroinvasive E coli (EIEC) NOT DETECTED NOT DETECTED Final   Cryptosporidium NOT DETECTED NOT DETECTED Final   Cyclospora cayetanensis NOT DETECTED NOT DETECTED Final   Entamoeba histolytica NOT DETECTED NOT DETECTED Final   Giardia lamblia NOT DETECTED NOT DETECTED Final   Adenovirus F40/41 NOT DETECTED NOT DETECTED Final   Astrovirus NOT DETECTED NOT DETECTED Final   Norovirus GI/GII NOT DETECTED NOT DETECTED Final   Rotavirus A NOT DETECTED NOT DETECTED Final   Sapovirus (I, II, IV, and V) NOT DETECTED NOT DETECTED Final    Comment: Performed at [facility], [address] [place], [name] [address]  C Difficile Quick Screen w PCR reflex     Status: Abnormal   Collection Time: 10/28/21  5:15 AM   Specimen: STOOL  Result Value Ref Range Status   C Diff antigen POSITIVE (A) NEGATIVE Final   C Diff toxin POSITIVE (A) NEGATIVE Final   C Diff interpretation Toxin producing C. difficile detected.  Final    Comment: CRITICAL RESULT CALLED TO, READ BACK BY AND VERIFIED WITH: ADEYEMI AJIBADE AT 2761 10/28/21.PMF Performed at [facility], [address], [place], [place] [address]          Radiology Studies: No results found.      Scheduled Meds:  Chlorhexidine Gluconate Cloth  6 each Topical Q0600   clopidogrel  75 mg Oral Daily   DULoxetine  60 mg Oral Daily   gabapentin  600 mg Oral BID   insulin aspart  0-15 Units Subcutaneous TID WC   insulin aspart  0-5 Units Subcutaneous QHS   insulin glargine-yfgn  20 Units Subcutaneous QHS   levothyroxine  50 mcg Oral Q0600   melatonin  5 mg Oral QHS   mupirocin ointment  1 application Nasal BID   pantoprazole  40 mg Oral Q breakfast   pramipexole  1 mg Oral q1600   tamsulosin  0.4 mg Oral QPC breakfast   vancomycin  125 mg Oral QID   Continuous Infusions:     LOS: 7 days    Time spent: 15 mins      Wyvonnia Dusky, MD [facility] Pager [phone]  If 7PM-7AM, please contact night-coverage 11/01/2021, 7:42 AM

## 2021-11-01 NOTE — Plan of Care (Signed)
  Problem: Clinical Measurements: Goal: Ability to avoid or minimize complications of infection will improve Outcome: Progressing   Problem: Skin Integrity: Goal: Skin integrity will improve Outcome: Progressing   Problem: Education: Goal: Knowledge of General Education information will improve Description: Including pain rating scale, medication(s)/side effects and non-pharmacologic comfort measures Outcome: Progressing   Problem: Health Behavior/Discharge Planning: Goal: Ability to manage health-related needs will improve Outcome: Progressing   Problem: Clinical Measurements: Goal: Ability to maintain clinical measurements within normal limits will improve Outcome: Progressing Goal: Will remain free from infection Outcome: Progressing Goal: Diagnostic test results will improve Outcome: Progressing Goal: Respiratory complications will improve Outcome: Progressing Goal: Cardiovascular complication will be avoided Outcome: Progressing   

## 2021-11-01 NOTE — Progress Notes (Signed)
Occupational Therapy Treatment Patient Details Name: James AIKEN, MD MRN: [id number] DOB: 07/22/1931 Today's Date: 11/01/2021   History of present illness Pt is an 88 y.o. male presenting to hospital 2/7 with c/o weakness and fall (was weak anfd fell into the wall and slid to floor).  Pt admitted with postural dizziness with presyncope, cellulitis of L foot, infected abrasion of L heel (friction burn secondary to RLS), diarrhea, and frequent UTI.  PMH includes h/o CVA with R sided hemiplegia, CKD, BPH, anemia, HLD, OSA on CPAP, DM, CKD, h/o hiatal hernia, OSA on CPAP, RLS, hearing loss R ear, and CEA.   OT comments  Mr Interrante was seen for OT treatment on this date. Upon arrival to room pt reclined in bed, agreeable to tx. Pt required MIN A to exit R side of bed. Tolerates ~5 min static sitting using BUE support improving to single UE support. MIN A + RW for bed>chair step pivot t/f. Pt making progress toward goals. Pt continues to benefit from skilled OT services to maximize return to PLOF and minimize risk of future falls, injury, caregiver burden, and readmission. Will continue to follow POC. Discharge recommendation remains appropriate.     Recommendations for follow up therapy are one component of a multi-disciplinary discharge planning process, led by the attending physician.  Recommendations may be updated based on patient status, additional functional criteria and insurance authorization.    Follow Up Recommendations  Skilled nursing-short term rehab (<3 hours/day)    Assistance Recommended at Discharge Frequent or constant Supervision/Assistance  Patient can return home with the following  A lot of help with walking and/or transfers;A lot of help with bathing/dressing/bathroom;Help with stairs or ramp for entrance;Assistance with cooking/housework   Equipment Recommendations  BSC/3in1    Recommendations for Other Services      Precautions / Restrictions  Precautions Precautions: Fall Restrictions Weight Bearing Restrictions: No       Mobility Bed Mobility Overal bed mobility: Needs Assistance Bed Mobility: Supine to Sit     Supine to sit: Min assist          Transfers Overall transfer level: Needs assistance Equipment used: Rolling walker (2 wheels) Transfers: Sit to/from Stand Sit to Stand: Min assist, From elevated surface     Step pivot transfers: Min assist           Balance Overall balance assessment: Needs assistance Sitting-balance support: No upper extremity supported, Feet supported Sitting balance-Leahy Scale: Good Sitting balance - Comments: steady sitting reaching within BOS   Standing balance support: Bilateral upper extremity supported, During functional activity, Reliant on assistive device for balance Standing balance-Leahy Scale: Fair Standing balance comment: steady static standing with at least single UE support                           ADL either performed or assessed with clinical judgement   ADL Overall ADL's : Needs assistance/impaired                                       General ADL Comments: MIN A + RW for simulated BSC t/f. MAX A for LBD seated EOB      Cognition Arousal/Alertness: Awake/alert Behavior During Therapy: WFL for tasks assessed/performed Overall Cognitive Status: Within Functional Limits for tasks assessed  Pertinent Vitals/ Pain       Pain Assessment Pain Assessment: Faces Faces Pain Scale: Hurts a little bit Pain Location: L heel Pain Descriptors / Indicators: Sore Pain Intervention(s): Limited activity within patient's tolerance, Repositioned   Frequency  Min 2X/week        Progress Toward Goals  OT Goals(current goals can now be found in the care plan section)  Progress towards OT goals: Progressing toward goals  Acute Rehab OT Goals Patient  Stated Goal: to feel better OT Goal Formulation: With patient Time For Goal Achievement: 11/10/21 Potential to Achieve Goals: Good ADL Goals Pt Will Perform Grooming: with min guard assist;standing Pt Will Perform Lower Body Dressing: with min assist;sit to/from stand Pt Will Transfer to Toilet: with supervision;ambulating;bedside commode Pt Will Perform Toileting - Clothing Manipulation and hygiene: with min guard assist;sit to/from stand  Plan Discharge plan remains appropriate;Frequency remains appropriate    Co-evaluation                 AM-PAC OT "6 Clicks" Daily Activity     Outcome Measure   Help from another person eating meals?: None Help from another person taking care of personal grooming?: A Little Help from another person toileting, which includes using toliet, bedpan, or urinal?: A Lot Help from another person bathing (including washing, rinsing, drying)?: A Lot Help from another person to put on and taking off regular upper body clothing?: A Little Help from another person to put on and taking off regular lower body clothing?: A Little 6 Click Score: 17    End of Session Equipment Utilized During Treatment: Rolling walker (2 wheels)  OT Visit Diagnosis: Other abnormalities of gait and mobility (R26.89);Muscle weakness (generalized) (M62.81)   Activity Tolerance Patient tolerated treatment well   Patient Left in chair;with call bell/phone within reach;with chair alarm set   Nurse Communication          Time: 1610-9604 OT Time Calculation (min): 13 min  Charges: OT General Charges $OT Visit: 1 Visit OT Treatments $Self Care/Home Management : 8-22 mins  Dessie Coma, M.S. OTR/L  11/01/21, 12:55 PM  ascom [phone]

## 2021-11-02 DIAGNOSIS — J690 Pneumonitis due to inhalation of food and vomit: Secondary | ICD-10-CM | POA: Diagnosis not present

## 2021-11-02 DIAGNOSIS — H9113 Presbycusis, bilateral: Secondary | ICD-10-CM | POA: Diagnosis not present

## 2021-11-02 DIAGNOSIS — K219 Gastro-esophageal reflux disease without esophagitis: Secondary | ICD-10-CM | POA: Diagnosis not present

## 2021-11-02 DIAGNOSIS — R339 Retention of urine, unspecified: Secondary | ICD-10-CM | POA: Diagnosis not present

## 2021-11-02 DIAGNOSIS — G4733 Obstructive sleep apnea (adult) (pediatric): Secondary | ICD-10-CM | POA: Diagnosis not present

## 2021-11-02 DIAGNOSIS — Z7902 Long term (current) use of antithrombotics/antiplatelets: Secondary | ICD-10-CM | POA: Diagnosis not present

## 2021-11-02 DIAGNOSIS — N390 Urinary tract infection, site not specified: Secondary | ICD-10-CM

## 2021-11-02 DIAGNOSIS — Z833 Family history of diabetes mellitus: Secondary | ICD-10-CM | POA: Diagnosis not present

## 2021-11-02 DIAGNOSIS — N200 Calculus of kidney: Secondary | ICD-10-CM | POA: Diagnosis not present

## 2021-11-02 DIAGNOSIS — I129 Hypertensive chronic kidney disease with stage 1 through stage 4 chronic kidney disease, or unspecified chronic kidney disease: Secondary | ICD-10-CM | POA: Diagnosis not present

## 2021-11-02 DIAGNOSIS — R1110 Vomiting, unspecified: Secondary | ICD-10-CM | POA: Diagnosis present

## 2021-11-02 DIAGNOSIS — M7989 Other specified soft tissue disorders: Secondary | ICD-10-CM | POA: Diagnosis not present

## 2021-11-02 DIAGNOSIS — Z9181 History of falling: Secondary | ICD-10-CM | POA: Diagnosis not present

## 2021-11-02 DIAGNOSIS — I10 Essential (primary) hypertension: Secondary | ICD-10-CM | POA: Diagnosis not present

## 2021-11-02 DIAGNOSIS — N1831 Chronic kidney disease, stage 3a: Secondary | ICD-10-CM | POA: Diagnosis not present

## 2021-11-02 DIAGNOSIS — E785 Hyperlipidemia, unspecified: Secondary | ICD-10-CM | POA: Diagnosis not present

## 2021-11-02 DIAGNOSIS — E1165 Type 2 diabetes mellitus with hyperglycemia: Secondary | ICD-10-CM | POA: Diagnosis not present

## 2021-11-02 DIAGNOSIS — I69351 Hemiplegia and hemiparesis following cerebral infarction affecting right dominant side: Secondary | ICD-10-CM | POA: Diagnosis not present

## 2021-11-02 DIAGNOSIS — I639 Cerebral infarction, unspecified: Secondary | ICD-10-CM | POA: Diagnosis not present

## 2021-11-02 DIAGNOSIS — Z79890 Hormone replacement therapy (postmenopausal): Secondary | ICD-10-CM | POA: Diagnosis not present

## 2021-11-02 DIAGNOSIS — E860 Dehydration: Secondary | ICD-10-CM | POA: Diagnosis not present

## 2021-11-02 DIAGNOSIS — I499 Cardiac arrhythmia, unspecified: Secondary | ICD-10-CM | POA: Diagnosis not present

## 2021-11-02 DIAGNOSIS — A0472 Enterocolitis due to Clostridium difficile, not specified as recurrent: Secondary | ICD-10-CM | POA: Diagnosis not present

## 2021-11-02 DIAGNOSIS — E1122 Type 2 diabetes mellitus with diabetic chronic kidney disease: Secondary | ICD-10-CM | POA: Diagnosis not present

## 2021-11-02 DIAGNOSIS — L03116 Cellulitis of left lower limb: Secondary | ICD-10-CM | POA: Diagnosis not present

## 2021-11-02 DIAGNOSIS — Z79899 Other long term (current) drug therapy: Secondary | ICD-10-CM | POA: Diagnosis not present

## 2021-11-02 DIAGNOSIS — R531 Weakness: Secondary | ICD-10-CM | POA: Diagnosis not present

## 2021-11-02 DIAGNOSIS — R2689 Other abnormalities of gait and mobility: Secondary | ICD-10-CM | POA: Diagnosis not present

## 2021-11-02 DIAGNOSIS — E871 Hypo-osmolality and hyponatremia: Secondary | ICD-10-CM | POA: Diagnosis not present

## 2021-11-02 DIAGNOSIS — Z794 Long term (current) use of insulin: Secondary | ICD-10-CM | POA: Diagnosis not present

## 2021-11-02 DIAGNOSIS — R600 Localized edema: Secondary | ICD-10-CM | POA: Diagnosis not present

## 2021-11-02 DIAGNOSIS — R509 Fever, unspecified: Secondary | ICD-10-CM | POA: Diagnosis not present

## 2021-11-02 DIAGNOSIS — N281 Cyst of kidney, acquired: Secondary | ICD-10-CM | POA: Diagnosis not present

## 2021-11-02 DIAGNOSIS — E1140 Type 2 diabetes mellitus with diabetic neuropathy, unspecified: Secondary | ICD-10-CM | POA: Diagnosis not present

## 2021-11-02 DIAGNOSIS — I208 Other forms of angina pectoris: Secondary | ICD-10-CM | POA: Diagnosis not present

## 2021-11-02 DIAGNOSIS — Z743 Need for continuous supervision: Secondary | ICD-10-CM | POA: Diagnosis not present

## 2021-11-02 DIAGNOSIS — Z20822 Contact with and (suspected) exposure to covid-19: Secondary | ICD-10-CM | POA: Diagnosis not present

## 2021-11-02 DIAGNOSIS — I700 Atherosclerosis of aorta: Secondary | ICD-10-CM | POA: Diagnosis not present

## 2021-11-02 DIAGNOSIS — Z841 Family history of disorders of kidney and ureter: Secondary | ICD-10-CM | POA: Diagnosis not present

## 2021-11-02 DIAGNOSIS — R110 Nausea: Secondary | ICD-10-CM | POA: Diagnosis not present

## 2021-11-02 DIAGNOSIS — F32A Depression, unspecified: Secondary | ICD-10-CM | POA: Diagnosis not present

## 2021-11-02 DIAGNOSIS — M6281 Muscle weakness (generalized): Secondary | ICD-10-CM | POA: Diagnosis not present

## 2021-11-02 DIAGNOSIS — E1169 Type 2 diabetes mellitus with other specified complication: Secondary | ICD-10-CM | POA: Diagnosis not present

## 2021-11-02 DIAGNOSIS — R55 Syncope and collapse: Secondary | ICD-10-CM | POA: Diagnosis not present

## 2021-11-02 DIAGNOSIS — S91302A Unspecified open wound, left foot, initial encounter: Secondary | ICD-10-CM | POA: Diagnosis not present

## 2021-11-02 DIAGNOSIS — Z85828 Personal history of other malignant neoplasm of skin: Secondary | ICD-10-CM | POA: Diagnosis not present

## 2021-11-02 DIAGNOSIS — R42 Dizziness and giddiness: Secondary | ICD-10-CM | POA: Diagnosis not present

## 2021-11-02 DIAGNOSIS — R918 Other nonspecific abnormal finding of lung field: Secondary | ICD-10-CM | POA: Diagnosis not present

## 2021-11-02 DIAGNOSIS — R112 Nausea with vomiting, unspecified: Secondary | ICD-10-CM | POA: Diagnosis not present

## 2021-11-02 DIAGNOSIS — N139 Obstructive and reflux uropathy, unspecified: Secondary | ICD-10-CM | POA: Diagnosis not present

## 2021-11-02 DIAGNOSIS — G2581 Restless legs syndrome: Secondary | ICD-10-CM | POA: Diagnosis not present

## 2021-11-02 DIAGNOSIS — R6889 Other general symptoms and signs: Secondary | ICD-10-CM | POA: Diagnosis not present

## 2021-11-02 LAB — CBC WITH DIFFERENTIAL/PLATELET
Abs Immature Granulocytes: 0.12 10*3/uL — ABNORMAL HIGH (ref 0.00–0.07)
Basophils Absolute: 0 10*3/uL (ref 0.0–0.1)
Basophils Relative: 0 %
Eosinophils Absolute: 0.2 10*3/uL (ref 0.0–0.5)
Eosinophils Relative: 2 %
HCT: 38.7 % — ABNORMAL LOW (ref 39.0–52.0)
Hemoglobin: 12.9 g/dL — ABNORMAL LOW (ref 13.0–17.0)
Immature Granulocytes: 1 %
Lymphocytes Relative: 22 %
Lymphs Abs: 2 10*3/uL (ref 0.7–4.0)
MCH: 29.4 pg (ref 26.0–34.0)
MCHC: 33.3 g/dL (ref 30.0–36.0)
MCV: 88.2 fL (ref 80.0–100.0)
Monocytes Absolute: 0.8 10*3/uL (ref 0.1–1.0)
Monocytes Relative: 8 %
Neutro Abs: 6 10*3/uL (ref 1.7–7.7)
Neutrophils Relative %: 67 %
Platelets: 251 10*3/uL (ref 150–400)
RBC: 4.39 MIL/uL (ref 4.22–5.81)
RDW: 13.5 % (ref 11.5–15.5)
WBC: 9 10*3/uL (ref 4.0–10.5)
nRBC: 0 % (ref 0.0–0.2)

## 2021-11-02 LAB — BASIC METABOLIC PANEL
Anion gap: 6 (ref 5–15)
BUN: 22 mg/dL (ref 8–23)
CO2: 26 mmol/L (ref 22–32)
Calcium: 8.5 mg/dL — ABNORMAL LOW (ref 8.9–10.3)
Chloride: 105 mmol/L (ref 98–111)
Creatinine, Ser: 1.29 mg/dL — ABNORMAL HIGH (ref 0.61–1.24)
GFR, Estimated: 53 mL/min — ABNORMAL LOW (ref >60)
Glucose, Bld: 124 mg/dL — ABNORMAL HIGH (ref 70–99)
Potassium: 3.9 mmol/L (ref 3.5–5.1)
Sodium: 137 mmol/L (ref 135–145)

## 2021-11-02 LAB — RESP PANEL BY RT-PCR (FLU A&B, COVID) ARPGX2
Influenza A by PCR: NEGATIVE
Influenza B by PCR: NEGATIVE
SARS Coronavirus 2 by RT PCR: NEGATIVE

## 2021-11-02 LAB — GLUCOSE, CAPILLARY
Glucose-Capillary: 135 mg/dL — ABNORMAL HIGH (ref 70–99)
Glucose-Capillary: 147 mg/dL — ABNORMAL HIGH (ref 70–99)
Glucose-Capillary: 203 mg/dL — ABNORMAL HIGH (ref 70–99)

## 2021-11-02 LAB — MAGNESIUM: Magnesium: 2 mg/dL (ref 1.7–2.4)

## 2021-11-02 MED ORDER — VANCOMYCIN HCL 125 MG PO CAPS: 125.0000 mg | ORAL_CAPSULE | Freq: Four times a day (QID) | ORAL | 0 refills | Status: AC

## 2021-11-02 MED ORDER — MUPIROCIN 2 % EX OINT: 1.0000 "application " | TOPICAL_OINTMENT | Freq: Two times a day (BID) | CUTANEOUS | 0 refills | Status: AC

## 2021-11-02 MED ORDER — ACETAMINOPHEN 325 MG PO TABS: 650.0000 mg | ORAL_TABLET | Freq: Four times a day (QID) | ORAL | Status: DC | PRN

## 2021-11-02 MED ORDER — INSULIN GLARGINE 100 UNIT/ML ~~LOC~~ SOLN: 20.0000 [IU] | Freq: Every day | SUBCUTANEOUS | 11 refills | Status: DC

## 2021-11-02 NOTE — TOC Progression Note (Signed)
Transition of Care [facility]) - Progression Note    Patient Details  Name: James SKOWRON, MD MRN: [id number] Date of Birth: [birth year]  Transition of Care [facility]) CM/SW [place], RN Phone Number: 11/02/2021, 2:22 PM  Clinical Narrative:   EMS called to transport patient to [facility] Room 304 The patient is number 5 on the list    Expected Discharge Plan: [place] Barriers to Discharge: Continued Medical Work up  Expected Discharge Plan and Services Expected Discharge Plan: [place]   Discharge Planning Services: CM Consult   Living arrangements for the past 2 months: [place] Expected Discharge Date: 11/02/21                                     Social Determinants of Health (SDOH) Interventions    Readmission Risk Interventions No flowsheet data found.

## 2021-11-02 NOTE — TOC Progression Note (Addendum)
Transition of Care [facility]) - Progression Note    Patient Details  Name: WIL SLAPE, MD MRN: [id number] Date of Birth: 02/25/1931  Transition of Care [facility]) CM/SW [place], RN Phone Number: 11/02/2021, 8:39 AM  Clinical Narrative:   Reached out to [facility] to inquire if they would offer a bed. Awaiting a response  Reached out to [facility] in [place], [phone], left a secure VM requesting a call back  Expected Discharge Plan: [place] Barriers to Discharge: Continued Medical Work up  Expected Discharge Plan and Services Expected Discharge Plan: [place]   Discharge Planning Services: CM Consult   Living arrangements for the past 2 months: Assisted Living Facility                                       Social Determinants of Health (SDOH) Interventions    Readmission Risk Interventions No flowsheet data found.

## 2021-11-02 NOTE — TOC Progression Note (Addendum)
Transition of Care [facility]) - Progression Note    Patient Details  Name: James PRUETT, MD MRN: [id number] Date of Birth: October 24, 1930  Transition of Care [facility]) CM/SW [place], RN Phone Number: 11/02/2021, 10:39 AM  Clinical Narrative:    Spoke with the daughter Wells Guiles and reviewed bed offers, she accepted the bed at [facility], sent clinical date thru the Hub to [facility] to [place] approval  Ins approved, ref number  [id number]  [place] today Will need EMS to transport  Expected Discharge Plan: [place] Barriers to Discharge: Continued Medical Work up  Expected Discharge Plan and Services Expected Discharge Plan: [place]   Discharge Planning Services: CM Consult   Living arrangements for the past 2 months: [place]                                       Social Determinants of Health (SDOH) Interventions    Readmission Risk Interventions No flowsheet data found.

## 2021-11-02 NOTE — TOC Progression Note (Signed)
Transition of Care [facility]) - Progression Note    Patient Details  Name: James SCHEPERS, MD MRN: [id number] Date of Birth: 1931/01/15  Transition of Care [facility]) CM/SW [place], RN Phone Number: 11/02/2021, 12:33 PM  Clinical Narrative:   I attempted to reach Wells Guiles the patient's daughter and was unable to reach, I notified him that he will be going to room 304 at [facility], He will notify his daughter, He will take his own CPAP to [facility]    Expected Discharge Plan: [place] Barriers to Discharge: Continued Medical Work up  Expected Discharge Plan and Services Expected Discharge Plan: [place]   Discharge Planning Services: CM Consult   Living arrangements for the past 2 months: [place]                                       Social Determinants of Health (SDOH) Interventions    Readmission Risk Interventions No flowsheet data found.

## 2021-11-02 NOTE — Discharge Summary (Signed)
Physician Discharge Summary  James Spurr, MD ZJI:[id number] DOB: 09-03-1931 DOA: 10/25/2021  PCP: [name], MD  Admit date: 10/25/2021 Discharge date: 11/02/2021  Time spent: 60 minutes  Recommendations for Outpatient Follow-up:  Follow-up with MD at SNF.  Patient will need a basic metabolic profile, magnesium level checked in 1 week to follow-up on electrolytes and renal function.   Discharge Diagnoses:  Principal Problem:   Postural dizziness with presyncope Active Problems:   C. difficile colitis   Hemiparesis affecting right side as late effect of cerebrovascular accident (CVA) ([place])   Uncontrolled type 2 diabetes mellitus with hyperglycemia, with long-term current use of insulin (HCC)   Stage 3a chronic kidney disease (HCC)   Restless leg syndrome, severe   HTN (hypertension)   Frequent UTI   Abrasion of heel, infected, left, initial encounter   Cellulitis of left foot   Diarrhea   Discharge Condition: Stable and improved  Diet recommendation: Carb modified diet  Filed Weights   10/25/21 1703  Weight: 88.9 kg    History of present illness:  HPI per Dr. Kieth Brightly, MD is a 88 y.o. male with medical history significant of DM, right primary paresis secondary to old stroke, HTN, CKD 3A, severe restless leg syndrome, who presents to the ED with generalized weakness leading to a near fall as he walked from one room to another.  Patient states he fell against the wall and was able to slide down to the floor.  He did not hit his head or sustain any injury.  He was concerned he might have a urinary tract infection as his doctor had treated him recently for a UTI.  Reports having diarrhea for the last few weeks, about 2 soft bowel movements daily.  His daughter who is at the bedside also contributes to the history he mentioned an area on his left heel which has been increasingly painful and red.  Patient has severe restless leg syndrome that is managed by  neurology, and he has been rubbing his left heel uncontrollably on the bed causing a wound to develop.  He denies nausea, vomiting, cough, shortness of breath or chest pain.   ED course: Tmax 100.3 with otherwise normal vitals on arrival Blood work: WBC 12,000 Blood glucose 302.  Creatinine at baseline at 1.35   EKG, personally viewed and interpreted: NSR at 86 with no acute ST-T wave changes   Imaging: CT head with all left basal ganglia lacunar infarct Chest x-ray no acute process Left foot x-ray.  No acute bony abnormality   Patient started on clindamycin and given an IV fluid bolus.  Hospitalist consulted for admission.    Hospital Course:  Postural dizziness with presyncope Secondary to acute infection in the setting of C. difficile diarrhea/colitis in the setting of hypovolemia. -Patient hydrated with IV fluids, placed on oral vancomycin during the hospitalization. -Patient improved clinically with no further episodes of presyncope or postural dizziness. -Outpatient follow-up..  C. difficile diarrhea Patient reported weeks of diarrhea in the setting of antibiotics for UTI -C. difficile PCR obtained was positive for antigen and toxin.  GI pathogen panel obtained was negative.   -Patient placed on oral vancomycin and improved clinically during the hospitalization.   -Patient be discharged to skilled nursing facility with 5 more days of oral vancomycin to complete a 10-day course.    Unlikely bacteremia/contamination -Blood cultures initially grew staph epidermis likely contaminant. -Repeat blood cultures done with no growth to date x5 days.  Cellulitis of left foot- (present on admission) Management as above   Abrasion of heel, infected, left, initial encounter Friction burn left heel secondary to restless leg syndrome with superimposed infection -Patient initially placed on IV vancomycin and Rocephin.   -Patient seen in consultation by wound care RN.   -Patient also seen  in consultation by podiatry who assessed the patient, recommended continued use of heel pad and pressure reducing boots with no signs or symptoms of infection.   -Per podiatry no further antibiotics recommended.     Uncontrolled type 2 diabetes mellitus with hyperglycemia, with long-term current use of insulin (HCC) -Hemoglobin A1c 7.2. Patient maintained on Semglee 20 units daily during the hospitalization as well as sliding scale insulin.      Frequent UTI Chart review reveals Klebsiella UTI 09/09/2021, treated Urine cultures done during this hospitalization were growing Enterobacter approximately 30,000 colonies per milliliter.  Patient did finish a course of antibiotic treatment during the hospitalization.   -No further antibiotics required.     HTN (hypertension)- (present on admission) Controlled during the hospitalization on home regimen Flomax.     Restless leg syndrome, severe- (present on admission) Home regimen Mirapex and Neurontin were resumed and he was noted that patient's daughter had stated had been very effective.   -Outpatient follow-up with neurologist as previously scheduled.    Stage 3a chronic kidney disease ([place]) Renal function at baseline throughout the hospitalization. -Medications were renally dosed.   Hemiparesis affecting right side as late effect of cerebrovascular accident (CVA) [facility]) Patient maintained on home regimen of antiplatelets and statin      Procedures: CT head 10/25/2021 Plan.  The left foot 10/25/2021 Chest x-ray 10/25/2021   Consultations: Podiatry: Dr. Vickki Muff 10/27/2021  Discharge Exam: Vitals:   11/02/21 0757 11/02/21 1211  BP: 119/62 118/60  Pulse: 65 63  Resp: 18 16  Temp: 98.1 F (36.7 C) 98.1 F (36.7 C)  SpO2: 95% 100%    General: NAD Cardiovascular: Regular rate rhythm no murmurs rubs or gallops.  No JVD.  No lower extremity edema. Respiratory: CTA B.  No wheezes, no crackles, no rhonchi.  Normal respiratory effort.   Speaking in full sentences.  Discharge Instructions   Discharge Instructions     Diet Carb Modified   Complete by: As directed    Discharge wound care:   Complete by: As directed    As above   Increase activity slowly   Complete by: As directed       Allergies as of 11/02/2021       Reactions   Dulaglutide    Other reaction(s): upset stomach Other reaction(s): Abdominal Pain, upset stomach Other reaction(s): upset stomach   Empagliflozin Other (See Comments)   Other reaction(s): yeast infection Other reaction(s): Other (See Comments) Other reaction(s): yeast infection   Lisinopril Cough   tachycardia   Nexium [esomeprazole] Other (See Comments)   Headache   Oxycodone Other (See Comments)   Even 1/2 tablet of 5mg  caused increased confusion - contributory to hospitalization 10/2020 with AMS   Pregabalin Other (See Comments), Nausea Only   Other reaction(s): MALAISE, SLIGHT NAUSEA, SLIGHT HEADACHE Other reaction(s): MALAISE, SLIGHT NAUSEA, SLIGHT HEADACHE   Rosuvastatin Other (See Comments)   Other reaction(s): muscle pain 2.16.2022 Pt is current on this medication without any issues. Other reaction(s): muscle pain 2.16.2022 Pt is current on this medication without any issues. Other reaction(s): muscle pain        Medication List     STOP taking these  medications    brimonidine 0.2 % ophthalmic solution Commonly known as: ALPHAGAN   cephALEXin 250 MG capsule Commonly known as: Keflex   Combigan 0.2-0.5 % ophthalmic solution Generic drug: brimonidine-timolol   docusate sodium 100 MG capsule Commonly known as: COLACE   dorzolamide-timolol 22.3-6.8 MG/ML ophthalmic solution Commonly known as: COSOPT   doxycycline 100 MG capsule Commonly known as: VIBRAMYCIN   Farxiga 5 MG Tabs tablet Generic drug: dapagliflozin propanediol   Fish Oil 1000 MG Caps   fluconazole 150 MG tablet Commonly known as: DIFLUCAN   Fluocinolone Acetonide 0.01 % Oil    ipratropium 0.03 % nasal spray Commonly known as: ATROVENT   l-methylfolate-B6-B12 3-35-2 MG Tabs tablet Commonly known as: METANX   LORazepam 0.5 MG tablet Commonly known as: ATIVAN   Lumigan 0.01 % Soln Generic drug: bimatoprost   ofloxacin 0.3 % OTIC solution Commonly known as: FLOXIN   Ozempic (1 MG/DOSE) 4 MG/3ML Sopn Generic drug: Semaglutide (1 MG/DOSE)   rosuvastatin 40 MG tablet Commonly known as: CRESTOR   senna-docusate 8.6-50 MG tablet Commonly known as: Senokot-S   Tresiba FlexTouch 200 UNIT/ML FlexTouch Pen Generic drug: insulin degludec   Vyzulta 0.024 % Soln Generic drug: Latanoprostene Bunod       TAKE these medications    acetaminophen 325 MG tablet Commonly known as: TYLENOL Take 2 tablets (650 mg total) by mouth every 6 (six) hours as needed for mild pain (or Fever >/= 101).   Alpha-Lipoic Acid 600 MG Caps Take 1 capsule (600 mg total) by mouth daily.   celecoxib 200 MG capsule Commonly known as: CELEBREX   clopidogrel 75 MG tablet Commonly known as: PLAVIX TAKE 1 TABLET DAILY   COQ10 150 MG Caps Take 1 capsule by mouth daily.   DULoxetine 60 MG capsule Commonly known as: CYMBALTA Take 1 capsule (60 mg total) by mouth daily.   famotidine 20 MG tablet Commonly known as: Pepcid Take 1 tablet (20 mg total) by mouth at bedtime. What changed:  when to take this reasons to take this   gabapentin 300 MG capsule Commonly known as: NEURONTIN Take 600 mg by mouth 3 (three) times daily. 600 mg BID is his minimum dose without having breakthrough symptoms. He is aware 900 mg/day is recommended per kidney function.   insulin aspart 100 UNIT/ML injection Commonly known as: novoLOG CBG 70 - 120: 0 units  CBG 121 - 150: 1 unit  CBG 151 - 200: 2 units  CBG 201 - 250: 3 units  CBG 251 - 300: 5 units  CBG 301 - 350: 7 units  CBG 351 - 400: 9 units What changed: Another medication with the same name was removed. Continue taking this  medication, and follow the directions you see here.   insulin glargine 100 UNIT/ML injection Commonly known as: LANTUS Inject 0.2 mLs (20 Units total) into the skin at bedtime. What changed: how much to take   levothyroxine 50 MCG tablet Commonly known as: SYNTHROID Take 1 tablet (50 mcg total) by mouth daily before breakfast. Per endo [name]   melatonin 5 MG Tabs Take 5 mg by mouth at bedtime.   mupirocin ointment 2 % Commonly known as: BACTROBAN Place 1 application into the nose 2 (two) times daily for 3 days.   nitroGLYCERIN 0.4 MG SL tablet Commonly known as: Nitrostat DISSOLVE 1 TABLET UNDER THE TONGUE EVERY 5 MINUTES AS NEEDED FOR CHEST PAIN   ondansetron 4 MG tablet Commonly known as: Zofran Take 1 tablet (4  mg total) by mouth every 8 (eight) hours as needed for nausea or vomiting.   polyethylene glycol 17 g packet Commonly known as: MIRALAX / GLYCOLAX Take 17 g by mouth daily as needed.   pramipexole 1 MG tablet Commonly known as: MIRAPEX Take 1 tablet (1 mg total) by mouth daily. At 4pm   RABEprazole 20 MG tablet Commonly known as: ACIPHEX Take 1 tablet (20 mg total) by mouth daily.   sodium chloride 5 % ophthalmic solution Commonly known as: MURO 128 1 drop as needed for eye irritation.   tamsulosin 0.4 MG Caps capsule Commonly known as: FLOMAX Take 1 capsule (0.4 mg total) by mouth daily after breakfast.   UltiCare Micro Pen Needles 31G X 8 MM Misc Generic drug: Insulin Pen Needle Use to inject medication daily   vancomycin 125 MG capsule Commonly known as: VANCOCIN Take 1 capsule (125 mg total) by mouth 4 (four) times daily for 5 days.   vitamin C 500 MG tablet Commonly known as: ASCORBIC ACID Take 1,000 mg by mouth daily as needed.               Durable Medical Equipment  (From admission, onward)           Start     Ordered   11/02/21 0825  For home use only DME 3 n 1  Once        11/02/21 0824              Discharge  Care Instructions  (From admission, onward)           Start     Ordered   11/02/21 0000  Discharge wound care:       Comments: As above   11/02/21 1354           Allergies  Allergen Reactions   Dulaglutide     Other reaction(s): upset stomach Other reaction(s): Abdominal Pain, upset stomach Other reaction(s): upset stomach    Empagliflozin Other (See Comments)    Other reaction(s): yeast infection Other reaction(s): Other (See Comments) Other reaction(s): yeast infection    Lisinopril Cough    tachycardia   Nexium [Esomeprazole] Other (See Comments)    Headache   Oxycodone Other (See Comments)    Even 1/2 tablet of 5mg  caused increased confusion - contributory to hospitalization 10/2020 with AMS   Pregabalin Other (See Comments) and Nausea Only    Other reaction(s): MALAISE, SLIGHT NAUSEA, SLIGHT HEADACHE Other reaction(s): MALAISE, SLIGHT NAUSEA, SLIGHT HEADACHE    Rosuvastatin Other (See Comments)    Other reaction(s): muscle pain 2.16.2022 Pt is current on this medication without any issues. Other reaction(s): muscle pain 2.16.2022 Pt is current on this medication without any issues. Other reaction(s): muscle pain    Contact information for follow-up providers     MD AT SNF Follow up.               Contact information for after-discharge care     Destination     [facility] .   Service: Skilled Nursing Contact information: [address] [place] [phone]                      The results of significant diagnostics from this hospitalization (including imaging, microbiology, ancillary and laboratory) are listed below for reference.    Significant Diagnostic Studies: DG Chest 2 View  Result Date: 10/25/2021 CLINICAL DATA:  Weakness, fell EXAM: CHEST -  2 VIEW COMPARISON:  03/31/2021 FINDINGS: Frontal and lateral views of the chest demonstrate a stable cardiac silhouette. No  acute airspace disease, effusion, or pneumothorax. No acute bony abnormalities. IMPRESSION: 1. Stable chest, no acute process. Electronically Signed   By: Randa Ngo M.D.   On: 10/25/2021 20:13   CT HEAD WO CONTRAST (5MM)  Result Date: 10/25/2021 CLINICAL DATA:  Head trauma, minor (Age >= 65y) fall EXAM: CT HEAD WITHOUT CONTRAST TECHNIQUE: Contiguous axial images were obtained from the base of the skull through the vertex without intravenous contrast. RADIATION DOSE REDUCTION: This exam was performed according to the departmental dose-optimization program which includes automated exposure control, adjustment of the mA and/or kV according to patient size and/or use of iterative reconstruction technique. COMPARISON:  03/31/2021 FINDINGS: Brain: Old left basal ganglia and periventricular lacunar infarct. There is atrophy and chronic small vessel disease changes. No acute intracranial abnormality. Specifically, no hemorrhage, hydrocephalus, mass lesion, acute infarction, or significant intracranial injury. Vascular: No hyperdense vessel or unexpected calcification. Skull: No acute calvarial abnormality. Sinuses/Orbits: No acute findings Other: None IMPRESSION: Old left basal ganglia lacunar infarct, stable. Atrophy, chronic microvascular disease. No acute intracranial abnormality. Electronically Signed   By: Rolm Baptise M.D.   On: 10/25/2021 19:55   DG Foot Complete Left  Result Date: 10/25/2021 CLINICAL DATA:  Left heel wound EXAM: LEFT FOOT - COMPLETE 3+ VIEW COMPARISON:  None. FINDINGS: No acute bony abnormality. Specifically, no fracture, subluxation, or dislocation. No bone destruction to suggest osteomyelitis. No soft tissue gas or radiopaque foreign body. IMPRESSION: No acute bony abnormality. Electronically Signed   By: Rolm Baptise M.D.   On: 10/25/2021 22:06    Microbiology: Recent Results (from the past 240 hour(s))  Resp Panel by RT-PCR (Flu A&B, Covid) Nasopharyngeal Swab     Status: None    Collection Time: 10/25/21 10:32 PM   Specimen: Nasopharyngeal Swab; Nasopharyngeal(NP) swabs in vial transport medium  Result Value Ref Range Status   SARS Coronavirus 2 by RT PCR NEGATIVE NEGATIVE Final    Comment: (NOTE) SARS-CoV-2 target nucleic acids are NOT DETECTED.  The SARS-CoV-2 RNA is generally detectable in upper respiratory specimens during the acute phase of infection. The lowest concentration of SARS-CoV-2 viral copies this assay can detect is 138 copies/mL. A negative result does not preclude SARS-Cov-2 infection and should not be used as the sole basis for treatment or other patient management decisions. A negative result may occur with  improper specimen collection/handling, submission of specimen other than nasopharyngeal swab, presence of viral mutation(s) within the areas targeted by this assay, and inadequate number of viral copies(<138 copies/mL). A negative result must be combined with clinical observations, patient history, and epidemiological information. The expected result is Negative.  Fact Sheet for Patients:  EntrepreneurPulse.com.au  Fact Sheet for Healthcare Providers:  IncredibleEmployment.be  This test is no t yet approved or cleared by the [place] FDA and  has been authorized for detection and/or diagnosis of SARS-CoV-2 by FDA under an Emergency Use Authorization (EUA). This EUA will remain  in effect (meaning this test can be used) for the duration of the COVID-19 declaration under Section 564(b)(1) of the Act, 21 U.S.C.section 360bbb-3(b)(1), unless the authorization is terminated  or revoked sooner.       Influenza A by PCR NEGATIVE NEGATIVE Final   Influenza B by PCR NEGATIVE NEGATIVE Final    Comment: (NOTE) The Xpert Xpress SARS-CoV-2/FLU/RSV plus assay is intended as an aid in the diagnosis of influenza from  Nasopharyngeal swab specimens and should not be used as a sole basis for treatment.  Nasal washings and aspirates are unacceptable for Xpert Xpress SARS-CoV-2/FLU/RSV testing.  Fact Sheet for Patients: EntrepreneurPulse.com.au  Fact Sheet for Healthcare Providers: IncredibleEmployment.be  This test is not yet approved or cleared by the [place] FDA and has been authorized for detection and/or diagnosis of SARS-CoV-2 by FDA under an Emergency Use Authorization (EUA). This EUA will remain in effect (meaning this test can be used) for the duration of the COVID-19 declaration under Section 564(b)(1) of the Act, 21 U.S.C. section 360bbb-3(b)(1), unless the authorization is terminated or revoked.  Performed at [facility], [address] [place], [place] [address]   Culture, blood (routine x 2)     Status: Abnormal   Collection Time: 10/25/21 10:33 PM   Specimen: BLOOD  Result Value Ref Range Status   Specimen Description   Final    BLOOD LEFT ANTECUBITAL Performed at [facility], [place] [address] [place], [place] [address]    Special Requests   Final    BOTTLES DRAWN AEROBIC AND ANAEROBIC Blood Culture adequate volume Performed at [facility], [address] [place], [place] [address]    Culture  Setup Time   Final    GRAM POSITIVE COCCI Organism ID to follow CRITICAL RESULT CALLED TO, READ BACK BY AND VERIFIED WITH: ABBY ELLINGTON @1438  10/26/21 SCS IN BOTH AEROBIC AND ANAEROBIC BOTTLES Performed at [facility], [address] [place], [place] [address]    Culture (A)  Final    STAPHYLOCOCCUS EPIDERMIDIS THE SIGNIFICANCE OF ISOLATING THIS ORGANISM FROM A SINGLE SET OF BLOOD CULTURES WHEN MULTIPLE SETS ARE DRAWN IS UNCERTAIN. PLEASE NOTIFY THE MICROBIOLOGY DEPARTMENT WITHIN ONE WEEK IF SPECIATION AND SENSITIVITIES ARE REQUIRED. Performed at [facility], [place] [address] [place], [place] [address]    Report Status 10/28/2021 FINAL  Final  Blood Culture ID Panel (Reflexed)      Status: Abnormal   Collection Time: 10/25/21 10:33 PM  Result Value Ref Range Status   Enterococcus faecalis NOT DETECTED NOT DETECTED Final   Enterococcus Faecium NOT DETECTED NOT DETECTED Final   Listeria monocytogenes NOT DETECTED NOT DETECTED Final   Staphylococcus species DETECTED (A) NOT DETECTED Final    Comment: CRITICAL RESULT CALLED TO, READ BACK BY AND VERIFIED WITH: ABBY ELLINGTON @1438  10/26/21 SCS    Staphylococcus aureus (BCID) NOT DETECTED NOT DETECTED Final   Staphylococcus epidermidis DETECTED (A) NOT DETECTED Final    Comment: Methicillin (oxacillin) resistant coagulase negative staphylococcus. Possible blood culture contaminant (unless isolated from more than one blood culture draw or clinical case suggests pathogenicity). No antibiotic treatment is indicated for blood  culture contaminants. CRITICAL RESULT CALLED TO, READ BACK BY AND VERIFIED WITH: ABBY ELLINGTON @1438  10/26/21 SCS    Staphylococcus lugdunensis NOT DETECTED NOT DETECTED Final   Streptococcus species NOT DETECTED NOT DETECTED Final   Streptococcus agalactiae NOT DETECTED NOT DETECTED Final   Streptococcus pneumoniae NOT DETECTED NOT DETECTED Final   Streptococcus pyogenes NOT DETECTED NOT DETECTED Final   A.calcoaceticus-baumannii NOT DETECTED NOT DETECTED Final   Bacteroides fragilis NOT DETECTED NOT DETECTED Final   Enterobacterales NOT DETECTED NOT DETECTED Final   Enterobacter cloacae complex NOT DETECTED NOT DETECTED Final   Escherichia coli NOT DETECTED NOT DETECTED Final   Klebsiella aerogenes NOT DETECTED NOT DETECTED Final   Klebsiella oxytoca NOT DETECTED NOT DETECTED Final   Klebsiella pneumoniae NOT DETECTED NOT DETECTED Final  Proteus species NOT DETECTED NOT DETECTED Final   Salmonella species NOT DETECTED NOT DETECTED Final   Serratia marcescens NOT DETECTED NOT DETECTED Final   Haemophilus influenzae NOT DETECTED NOT DETECTED Final   Neisseria meningitidis NOT DETECTED NOT DETECTED  Final   Pseudomonas aeruginosa NOT DETECTED NOT DETECTED Final   Stenotrophomonas maltophilia NOT DETECTED NOT DETECTED Final   Candida albicans NOT DETECTED NOT DETECTED Final   Candida auris NOT DETECTED NOT DETECTED Final   Candida glabrata NOT DETECTED NOT DETECTED Final   Candida krusei NOT DETECTED NOT DETECTED Final   Candida parapsilosis NOT DETECTED NOT DETECTED Final   Candida tropicalis NOT DETECTED NOT DETECTED Final   Cryptococcus neoformans/gattii NOT DETECTED NOT DETECTED Final   Methicillin resistance mecA/C DETECTED (A) NOT DETECTED Final    Comment: CRITICAL RESULT CALLED TO, READ BACK BY AND VERIFIED WITH: ABBY ELLINGTON @1438  10/26/21 SCS Performed at [facility], [address] [place], [place] [address]   Culture, blood (Routine X 2) w Reflex to ID Panel     Status: None   Collection Time: 10/26/21 12:49 AM   Specimen: BLOOD  Result Value Ref Range Status   Specimen Description BLOOD RIGHT HAND  Final   Special Requests   Final    BOTTLES DRAWN AEROBIC ONLY Blood Culture results may not be optimal due to an inadequate volume of blood received in culture bottles   Culture   Final    NO GROWTH 5 DAYS Performed at [facility], [address] [place], [place] [address]    Report Status 10/31/2021 FINAL  Final  Urine Culture     Status: Abnormal   Collection Time: 10/26/21  3:31 AM   Specimen: Urine, Random  Result Value Ref Range Status   Specimen Description   Final    URINE, RANDOM Performed at [facility], [address] [place], [place] [address]    Special Requests   Final    NONE Performed at [facility], [address] [place], [place] [address]    Culture 30,000 COLONIES/mL ENTEROBACTER AEROGENES (A)  Final   Report Status 10/29/2021 FINAL  Final   Organism ID, Bacteria ENTEROBACTER AEROGENES (A)  Final      Susceptibility   Enterobacter aerogenes - MIC*    CEFAZOLIN >=64 RESISTANT Resistant      CEFEPIME <=0.12 SENSITIVE Sensitive     CEFTRIAXONE <=0.25 SENSITIVE Sensitive     CIPROFLOXACIN <=0.25 SENSITIVE Sensitive     GENTAMICIN <=1 SENSITIVE Sensitive     IMIPENEM 2 SENSITIVE Sensitive     NITROFURANTOIN 32 SENSITIVE Sensitive     TRIMETH/SULFA <=20 SENSITIVE Sensitive     PIP/TAZO <=4 SENSITIVE Sensitive     * 30,000 COLONIES/mL ENTEROBACTER AEROGENES  CULTURE, BLOOD (ROUTINE X 2) w Reflex to ID Panel     Status: None   Collection Time: 10/27/21  8:16 AM   Specimen: BLOOD RIGHT HAND  Result Value Ref Range Status   Specimen Description BLOOD RIGHT HAND  Final   Special Requests   Final    BOTTLES DRAWN AEROBIC AND ANAEROBIC Blood Culture adequate volume   Culture   Final    NO GROWTH 5 DAYS Performed at [facility], [address] [place], [place] [address]    Report Status 11/01/2021 FINAL  Final  CULTURE, BLOOD (ROUTINE X 2) w Reflex to ID Panel     Status: None   Collection Time: 10/27/21  8:23 AM  Specimen: BLOOD LEFT HAND  Result Value Ref Range Status   Specimen Description BLOOD LEFT HAND  Final   Special Requests   Final    BOTTLES DRAWN AEROBIC ONLY Blood Culture results may not be optimal due to an inadequate volume of blood received in culture bottles   Culture   Final    NO GROWTH 5 DAYS Performed at [facility], [address], [place], [place] [address]    Report Status 11/01/2021 FINAL  Final  MRSA Next Gen by PCR, Nasal     Status: Abnormal   Collection Time: 10/27/21  9:42 AM   Specimen: Nasal Mucosa; Nasal Swab  Result Value Ref Range Status   MRSA by PCR Next Gen DETECTED (A) NOT DETECTED Final    Comment: RESULT CALLED TO, READ BACK BY AND VERIFIED WITH: ANGELA HESTER 10/27/21 1055 MW (NOTE) The GeneXpert MRSA Assay (FDA approved for NASAL specimens only), is one component of a comprehensive MRSA colonization surveillance program. It is not intended to diagnose MRSA infection nor to guide or monitor treatment for MRSA  infections. Test performance is not FDA approved in patients less than 87 years old. Performed at [facility], [address] [place], [place] [address]   Gastrointestinal Panel by PCR , Stool     Status: None   Collection Time: 10/28/21  5:15 AM   Specimen: Stool  Result Value Ref Range Status   Campylobacter species NOT DETECTED NOT DETECTED Final   Plesimonas shigelloides NOT DETECTED NOT DETECTED Final   Salmonella species NOT DETECTED NOT DETECTED Final   Yersinia enterocolitica NOT DETECTED NOT DETECTED Final   Vibrio species NOT DETECTED NOT DETECTED Final   Vibrio cholerae NOT DETECTED NOT DETECTED Final   Enteroaggregative E coli (EAEC) NOT DETECTED NOT DETECTED Final   Enteropathogenic E coli (EPEC) NOT DETECTED NOT DETECTED Final   Enterotoxigenic E coli (ETEC) NOT DETECTED NOT DETECTED Final   Shiga like toxin producing E coli (STEC) NOT DETECTED NOT DETECTED Final   Shigella/Enteroinvasive E coli (EIEC) NOT DETECTED NOT DETECTED Final   Cryptosporidium NOT DETECTED NOT DETECTED Final   Cyclospora cayetanensis NOT DETECTED NOT DETECTED Final   Entamoeba histolytica NOT DETECTED NOT DETECTED Final   Giardia lamblia NOT DETECTED NOT DETECTED Final   Adenovirus F40/41 NOT DETECTED NOT DETECTED Final   Astrovirus NOT DETECTED NOT DETECTED Final   Norovirus GI/GII NOT DETECTED NOT DETECTED Final   Rotavirus A NOT DETECTED NOT DETECTED Final   Sapovirus (I, II, IV, and V) NOT DETECTED NOT DETECTED Final    Comment: Performed at [facility], [address] [place], [name] [address]  C Difficile Quick Screen w PCR reflex     Status: Abnormal   Collection Time: 10/28/21  5:15 AM   Specimen: STOOL  Result Value Ref Range Status   C Diff antigen POSITIVE (A) NEGATIVE Final   C Diff toxin POSITIVE (A) NEGATIVE Final   C Diff interpretation Toxin producing C. difficile detected.  Final    Comment: CRITICAL RESULT CALLED TO, READ BACK BY AND VERIFIED  WITH: ADEYEMI AJIBADE AT 7001 10/28/21.PMF Performed at [facility], [address] [place], [place] [address]   Resp Panel by RT-PCR (Flu A&B, Covid) Nasopharyngeal Swab     Status: None   Collection Time: 11/02/21 12:00 PM   Specimen: Nasopharyngeal Swab; Nasopharyngeal(NP) swabs in vial transport medium  Result Value Ref Range Status   SARS Coronavirus 2 by RT PCR NEGATIVE  NEGATIVE Final    Comment: (NOTE) SARS-CoV-2 target nucleic acids are NOT DETECTED.  The SARS-CoV-2 RNA is generally detectable in upper respiratory specimens during the acute phase of infection. The lowest concentration of SARS-CoV-2 viral copies this assay can detect is 138 copies/mL. A negative result does not preclude SARS-Cov-2 infection and should not be used as the sole basis for treatment or other patient management decisions. A negative result may occur with  improper specimen collection/handling, submission of specimen other than nasopharyngeal swab, presence of viral mutation(s) within the areas targeted by this assay, and inadequate number of viral copies(<138 copies/mL). A negative result must be combined with clinical observations, patient history, and epidemiological information. The expected result is Negative.  Fact Sheet for Patients:  EntrepreneurPulse.com.au  Fact Sheet for Healthcare Providers:  IncredibleEmployment.be  This test is no t yet approved or cleared by the [place] FDA and  has been authorized for detection and/or diagnosis of SARS-CoV-2 by FDA under an Emergency Use Authorization (EUA). This EUA will remain  in effect (meaning this test can be used) for the duration of the COVID-19 declaration under Section 564(b)(1) of the Act, 21 U.S.C.section 360bbb-3(b)(1), unless the authorization is terminated  or revoked sooner.       Influenza A by PCR NEGATIVE NEGATIVE Final   Influenza B by PCR NEGATIVE NEGATIVE Final     Comment: (NOTE) The Xpert Xpress SARS-CoV-2/FLU/RSV plus assay is intended as an aid in the diagnosis of influenza from Nasopharyngeal swab specimens and should not be used as a sole basis for treatment. Nasal washings and aspirates are unacceptable for Xpert Xpress SARS-CoV-2/FLU/RSV testing.  Fact Sheet for Patients: EntrepreneurPulse.com.au  Fact Sheet for Healthcare Providers: IncredibleEmployment.be  This test is not yet approved or cleared by the [place] FDA and has been authorized for detection and/or diagnosis of SARS-CoV-2 by FDA under an Emergency Use Authorization (EUA). This EUA will remain in effect (meaning this test can be used) for the duration of the COVID-19 declaration under Section 564(b)(1) of the Act, 21 U.S.C. section 360bbb-3(b)(1), unless the authorization is terminated or revoked.  Performed at [facility], [address] [name], [place] [address]      Labs: Basic Metabolic Panel: Recent Labs  Lab 10/29/21 0433 10/30/21 0410 10/31/21 0454 11/01/21 0820 11/02/21 0843  NA 137 135 134* 136 137  K 3.7 3.9 3.8 3.8 3.9  CL 107 105 100 104 105  CO2 22 23 23 25 26   GLUCOSE 108* 117* 179* 136* 124*  BUN 17 18 18 20 22   CREATININE 1.29* 1.36* 1.17 1.24 1.29*  CALCIUM 8.4* 8.3* 8.3* 8.4* 8.5*  MG  --   --   --   --  2.0   Liver Function Tests: No results for input(s): AST, ALT, ALKPHOS, BILITOT, PROT, ALBUMIN in the last 168 hours. No results for input(s): LIPASE, AMYLASE in the last 168 hours. No results for input(s): AMMONIA in the last 168 hours. CBC: Recent Labs  Lab 10/29/21 0433 10/30/21 0410 10/31/21 0454 11/01/21 0820 11/02/21 0843  WBC 9.1 9.4 9.9 8.3 9.0  NEUTROABS  --   --   --   --  6.0  HGB 12.4* 12.1* 12.8* 13.0 12.9*  HCT 37.9* 37.1* 39.1 39.2 38.7*  MCV 90.5 89.0 89.3 88.9 88.2  PLT 229 219 225 226 251   Cardiac Enzymes: No results for input(s): CKTOTAL, CKMB,  CKMBINDEX, TROPONINI in the last 168 hours. BNP: BNP (last 3 results) Recent Labs  10/25/21 2232  BNP 74.4    ProBNP (last 3 results) No results for input(s): PROBNP in the last 8760 hours.  CBG: Recent Labs  Lab 11/01/21 1137 11/01/21 1619 11/01/21 2115 11/02/21 0730 11/02/21 1122  GLUCAP 147* 139* 185* 135* 147*       Signed:  Irine Seal MD.  [facility] 11/02/2021, 2:14 PM

## 2021-11-02 NOTE — Care Management Important Message (Signed)
Important Message  Patient Details  Name: ERWIN NISHIYAMA, MD MRN: [id number] Date of Birth: [birth year]   Medicare Important Message Given:  Yes  Patient is in an isolation room so I talked with him by phone [phone]) and reviewed the Important Message from Medicare with him. He is agreement with the discharge for today.  I wished him well and thanked him for his time.  Juliann Pulse A Elener Custodio 11/02/2021, 1:45 PM

## 2021-11-04 DIAGNOSIS — I208 Other forms of angina pectoris: Secondary | ICD-10-CM | POA: Diagnosis not present

## 2021-11-04 DIAGNOSIS — N139 Obstructive and reflux uropathy, unspecified: Secondary | ICD-10-CM | POA: Diagnosis not present

## 2021-11-10 ENCOUNTER — Ambulatory Visit: Payer: Medicare Other | Admitting: Family Medicine

## 2021-11-14 DIAGNOSIS — I208 Other forms of angina pectoris: Secondary | ICD-10-CM | POA: Diagnosis not present

## 2021-11-14 DIAGNOSIS — N139 Obstructive and reflux uropathy, unspecified: Secondary | ICD-10-CM | POA: Diagnosis not present

## 2021-11-16 ENCOUNTER — Telehealth: Payer: Self-pay

## 2021-11-16 ENCOUNTER — Telehealth: Payer: Self-pay | Admitting: Family Medicine

## 2021-11-16 DIAGNOSIS — G2581 Restless legs syndrome: Secondary | ICD-10-CM | POA: Diagnosis not present

## 2021-11-16 DIAGNOSIS — I10 Essential (primary) hypertension: Secondary | ICD-10-CM | POA: Diagnosis not present

## 2021-11-16 DIAGNOSIS — Z9181 History of falling: Secondary | ICD-10-CM | POA: Diagnosis not present

## 2021-11-16 DIAGNOSIS — N1831 Chronic kidney disease, stage 3a: Secondary | ICD-10-CM | POA: Diagnosis not present

## 2021-11-16 DIAGNOSIS — E1169 Type 2 diabetes mellitus with other specified complication: Secondary | ICD-10-CM | POA: Diagnosis not present

## 2021-11-16 DIAGNOSIS — Z794 Long term (current) use of insulin: Secondary | ICD-10-CM | POA: Diagnosis not present

## 2021-11-16 DIAGNOSIS — R339 Retention of urine, unspecified: Secondary | ICD-10-CM | POA: Diagnosis not present

## 2021-11-16 DIAGNOSIS — I69351 Hemiplegia and hemiparesis following cerebral infarction affecting right dominant side: Secondary | ICD-10-CM | POA: Diagnosis not present

## 2021-11-16 NOTE — Telephone Encounter (Signed)
Lvm with [facility] asking for a call back.  Need a good fax # to return forms for pt.  [phone] and [phone] do not work.  ?

## 2021-11-16 NOTE — Telephone Encounter (Signed)
Please mail to: ? ?Attention [name] with [employer] ?[name] [address] ? ?[name] states that faxes are coming through on their end, so not sure why ours isn't. ?

## 2021-11-16 NOTE — Telephone Encounter (Signed)
James Bell with [facility] returning your call concerning pt. Please advise. ?

## 2021-11-16 NOTE — Telephone Encounter (Signed)
Called and spoke to [name] at [facility] and advised her that the last fax number that she gave [name] is not working either. James Bell was advised that we now have tried to fax the paperwork to them at 3 different numbers and none have worked. James Bell was asked if we can mail the paperwork to her and she said yes. James Bell stated that she will check and call back with the address that this needs to be mailed to. James Bell [phone] ?

## 2021-11-16 NOTE — Telephone Encounter (Signed)
See other phn note from today. ?

## 2021-11-16 NOTE — Telephone Encounter (Signed)
[name] of [facility] rtn call.  (See other phn noted from today.) ? ?Spoke with Andee Poles asking for a different fax #.  She provided fax # [phone] to send pt's form.  ? ?Faxed form.  ?

## 2021-11-17 NOTE — Telephone Encounter (Signed)
Tried 1 more time to fax form.  Received another 'Failed Fax'.  Mailed form to [place] at address provided below. ? ?[Made copy to scan.] ?

## 2021-11-21 ENCOUNTER — Emergency Department: Payer: Medicare Other

## 2021-11-21 ENCOUNTER — Other Ambulatory Visit: Payer: Self-pay

## 2021-11-21 ENCOUNTER — Inpatient Hospital Stay
Admission: EM | Admit: 2021-11-21 | Discharge: 2021-11-24 | DRG: 178 | Disposition: A | Discharge status: Skilled Nursing Facility | Payer: Medicare Other | Source: Skilled Nursing Facility | Attending: Internal Medicine | Admitting: Internal Medicine

## 2021-11-21 DIAGNOSIS — J690 Pneumonitis due to inhalation of food and vomit: Principal | ICD-10-CM | POA: Diagnosis present

## 2021-11-21 DIAGNOSIS — I69351 Hemiplegia and hemiparesis following cerebral infarction affecting right dominant side: Secondary | ICD-10-CM

## 2021-11-21 DIAGNOSIS — Z79890 Hormone replacement therapy (postmenopausal): Secondary | ICD-10-CM

## 2021-11-21 DIAGNOSIS — Z841 Family history of disorders of kidney and ureter: Secondary | ICD-10-CM

## 2021-11-21 DIAGNOSIS — E860 Dehydration: Secondary | ICD-10-CM | POA: Diagnosis present

## 2021-11-21 DIAGNOSIS — R1110 Vomiting, unspecified: Secondary | ICD-10-CM | POA: Diagnosis present

## 2021-11-21 DIAGNOSIS — I129 Hypertensive chronic kidney disease with stage 1 through stage 4 chronic kidney disease, or unspecified chronic kidney disease: Secondary | ICD-10-CM | POA: Diagnosis not present

## 2021-11-21 DIAGNOSIS — K219 Gastro-esophageal reflux disease without esophagitis: Secondary | ICD-10-CM | POA: Diagnosis not present

## 2021-11-21 DIAGNOSIS — E1165 Type 2 diabetes mellitus with hyperglycemia: Secondary | ICD-10-CM

## 2021-11-21 DIAGNOSIS — Z79899 Other long term (current) drug therapy: Secondary | ICD-10-CM

## 2021-11-21 DIAGNOSIS — G4733 Obstructive sleep apnea (adult) (pediatric): Secondary | ICD-10-CM | POA: Diagnosis present

## 2021-11-21 DIAGNOSIS — E1122 Type 2 diabetes mellitus with diabetic chronic kidney disease: Secondary | ICD-10-CM | POA: Diagnosis not present

## 2021-11-21 DIAGNOSIS — F32A Depression, unspecified: Secondary | ICD-10-CM | POA: Diagnosis present

## 2021-11-21 DIAGNOSIS — Z20822 Contact with and (suspected) exposure to covid-19: Secondary | ICD-10-CM | POA: Diagnosis present

## 2021-11-21 DIAGNOSIS — G2581 Restless legs syndrome: Secondary | ICD-10-CM | POA: Diagnosis present

## 2021-11-21 DIAGNOSIS — R509 Fever, unspecified: Secondary | ICD-10-CM | POA: Diagnosis not present

## 2021-11-21 DIAGNOSIS — Z833 Family history of diabetes mellitus: Secondary | ICD-10-CM

## 2021-11-21 DIAGNOSIS — Z7902 Long term (current) use of antithrombotics/antiplatelets: Secondary | ICD-10-CM | POA: Diagnosis not present

## 2021-11-21 DIAGNOSIS — R112 Nausea with vomiting, unspecified: Secondary | ICD-10-CM

## 2021-11-21 DIAGNOSIS — I499 Cardiac arrhythmia, unspecified: Secondary | ICD-10-CM | POA: Diagnosis not present

## 2021-11-21 DIAGNOSIS — Z85828 Personal history of other malignant neoplasm of skin: Secondary | ICD-10-CM

## 2021-11-21 DIAGNOSIS — R55 Syncope and collapse: Secondary | ICD-10-CM | POA: Diagnosis not present

## 2021-11-21 DIAGNOSIS — R110 Nausea: Secondary | ICD-10-CM | POA: Diagnosis not present

## 2021-11-21 DIAGNOSIS — Z743 Need for continuous supervision: Secondary | ICD-10-CM | POA: Diagnosis not present

## 2021-11-21 DIAGNOSIS — E871 Hypo-osmolality and hyponatremia: Secondary | ICD-10-CM | POA: Diagnosis present

## 2021-11-21 DIAGNOSIS — R6889 Other general symptoms and signs: Secondary | ICD-10-CM | POA: Diagnosis not present

## 2021-11-21 DIAGNOSIS — H9113 Presbycusis, bilateral: Secondary | ICD-10-CM | POA: Diagnosis present

## 2021-11-21 DIAGNOSIS — N200 Calculus of kidney: Secondary | ICD-10-CM | POA: Diagnosis not present

## 2021-11-21 DIAGNOSIS — Z794 Long term (current) use of insulin: Secondary | ICD-10-CM

## 2021-11-21 DIAGNOSIS — I639 Cerebral infarction, unspecified: Secondary | ICD-10-CM | POA: Diagnosis not present

## 2021-11-21 DIAGNOSIS — S91302A Unspecified open wound, left foot, initial encounter: Secondary | ICD-10-CM | POA: Diagnosis not present

## 2021-11-21 DIAGNOSIS — E1140 Type 2 diabetes mellitus with diabetic neuropathy, unspecified: Secondary | ICD-10-CM | POA: Diagnosis not present

## 2021-11-21 DIAGNOSIS — N1831 Chronic kidney disease, stage 3a: Secondary | ICD-10-CM | POA: Diagnosis present

## 2021-11-21 DIAGNOSIS — M7989 Other specified soft tissue disorders: Secondary | ICD-10-CM | POA: Diagnosis not present

## 2021-11-21 DIAGNOSIS — R918 Other nonspecific abnormal finding of lung field: Secondary | ICD-10-CM | POA: Diagnosis not present

## 2021-11-21 DIAGNOSIS — M6281 Muscle weakness (generalized): Secondary | ICD-10-CM | POA: Diagnosis not present

## 2021-11-21 DIAGNOSIS — R4182 Altered mental status, unspecified: Secondary | ICD-10-CM

## 2021-11-21 DIAGNOSIS — R0989 Other specified symptoms and signs involving the circulatory and respiratory systems: Secondary | ICD-10-CM

## 2021-11-21 DIAGNOSIS — E785 Hyperlipidemia, unspecified: Secondary | ICD-10-CM | POA: Diagnosis not present

## 2021-11-21 DIAGNOSIS — R600 Localized edema: Secondary | ICD-10-CM | POA: Diagnosis not present

## 2021-11-21 DIAGNOSIS — I700 Atherosclerosis of aorta: Secondary | ICD-10-CM | POA: Diagnosis not present

## 2021-11-21 DIAGNOSIS — N281 Cyst of kidney, acquired: Secondary | ICD-10-CM | POA: Diagnosis not present

## 2021-11-21 DIAGNOSIS — R2689 Other abnormalities of gait and mobility: Secondary | ICD-10-CM | POA: Diagnosis not present

## 2021-11-21 DIAGNOSIS — R531 Weakness: Secondary | ICD-10-CM

## 2021-11-21 DIAGNOSIS — R197 Diarrhea, unspecified: Secondary | ICD-10-CM

## 2021-11-21 LAB — CBC WITH DIFFERENTIAL/PLATELET
Abs Immature Granulocytes: 0.04 10*3/uL (ref 0.00–0.07)
Basophils Absolute: 0 10*3/uL (ref 0.0–0.1)
Basophils Relative: 0 %
Eosinophils Absolute: 0.1 10*3/uL (ref 0.0–0.5)
Eosinophils Relative: 1 %
HCT: 42.2 % (ref 39.0–52.0)
Hemoglobin: 14 g/dL (ref 13.0–17.0)
Immature Granulocytes: 0 %
Lymphocytes Relative: 12 %
Lymphs Abs: 1.1 10*3/uL (ref 0.7–4.0)
MCH: 29.3 pg (ref 26.0–34.0)
MCHC: 33.2 g/dL (ref 30.0–36.0)
MCV: 88.3 fL (ref 80.0–100.0)
Monocytes Absolute: 0.8 10*3/uL (ref 0.1–1.0)
Monocytes Relative: 9 %
Neutro Abs: 7 10*3/uL (ref 1.7–7.7)
Neutrophils Relative %: 78 %
Platelets: 174 10*3/uL (ref 150–400)
RBC: 4.78 MIL/uL (ref 4.22–5.81)
RDW: 13.6 % (ref 11.5–15.5)
WBC: 9 10*3/uL (ref 4.0–10.5)
nRBC: 0 % (ref 0.0–0.2)

## 2021-11-21 LAB — COMPREHENSIVE METABOLIC PANEL
ALT: 10 U/L (ref 0–44)
AST: 20 U/L (ref 15–41)
Albumin: 3.3 g/dL — ABNORMAL LOW (ref 3.5–5.0)
Alkaline Phosphatase: 49 U/L (ref 38–126)
Anion gap: 8 (ref 5–15)
BUN: 18 mg/dL (ref 8–23)
CO2: 22 mmol/L (ref 22–32)
Calcium: 8.4 mg/dL — ABNORMAL LOW (ref 8.9–10.3)
Chloride: 104 mmol/L (ref 98–111)
Creatinine, Ser: 1.37 mg/dL — ABNORMAL HIGH (ref 0.61–1.24)
GFR, Estimated: 50 mL/min — ABNORMAL LOW (ref >60)
Glucose, Bld: 218 mg/dL — ABNORMAL HIGH (ref 70–99)
Potassium: 4 mmol/L (ref 3.5–5.1)
Sodium: 134 mmol/L — ABNORMAL LOW (ref 135–145)
Total Bilirubin: 1.2 mg/dL (ref 0.3–1.2)
Total Protein: 7 g/dL (ref 6.5–8.1)

## 2021-11-21 LAB — URINALYSIS, COMPLETE (UACMP) WITH MICROSCOPIC
Bilirubin Urine: NEGATIVE
Glucose, UA: 50 mg/dL — AB
Hgb urine dipstick: NEGATIVE
Ketones, ur: NEGATIVE mg/dL
Leukocytes,Ua: NEGATIVE
Nitrite: NEGATIVE
Protein, ur: 30 mg/dL — AB
Specific Gravity, Urine: 1.017 (ref 1.005–1.030)
pH: 5 (ref 5.0–8.0)

## 2021-11-21 LAB — LACTIC ACID, PLASMA
Lactic Acid, Venous: 1.6 mmol/L (ref 0.5–1.9)
Lactic Acid, Venous: 1.1 mmol/L (ref 0.5–1.9)

## 2021-11-21 LAB — PROTIME-INR
INR: 1.1 (ref 0.8–1.2)
Prothrombin Time: 14.2 seconds (ref 11.4–15.2)

## 2021-11-21 LAB — RESP PANEL BY RT-PCR (FLU A&B, COVID) ARPGX2
Influenza A by PCR: NEGATIVE
Influenza B by PCR: NEGATIVE
SARS Coronavirus 2 by RT PCR: NEGATIVE

## 2021-11-21 LAB — CBG MONITORING, ED
Glucose-Capillary: 215 mg/dL — ABNORMAL HIGH (ref 70–99)
Glucose-Capillary: 174 mg/dL — ABNORMAL HIGH (ref 70–99)

## 2021-11-21 LAB — TROPONIN I (HIGH SENSITIVITY)
Troponin I (High Sensitivity): 6 ng/L (ref <18)
Troponin I (High Sensitivity): 6 ng/L (ref <18)

## 2021-11-21 LAB — APTT: aPTT: 33 seconds (ref 24–36)

## 2021-11-21 MED ORDER — LACTATED RINGERS IV SOLN: INTRAVENOUS | Status: AC

## 2021-11-21 MED ORDER — GABAPENTIN 300 MG PO CAPS
600.0000 mg | ORAL_CAPSULE | Freq: Three times a day (TID) | ORAL | Status: DC
  Administered 2021-11-21 – 2021-11-24 (×10): 600 mg via ORAL
  Filled 2021-11-21 (×10): qty 2

## 2021-11-21 MED ORDER — INSULIN ASPART 100 UNIT/ML IJ SOLN
0.0000 [IU] | Freq: Three times a day (TID) | INTRAMUSCULAR | Status: DC
  Administered 2021-11-21: 3 [IU] via SUBCUTANEOUS
  Administered 2021-11-22 (×3): 2 [IU] via SUBCUTANEOUS
  Administered 2021-11-23 (×3): 3 [IU] via SUBCUTANEOUS
  Administered 2021-11-24: 17:00:00 1 [IU] via SUBCUTANEOUS
  Administered 2021-11-24: 13:00:00 3 [IU] via SUBCUTANEOUS
  Administered 2021-11-24: 10:00:00 2 [IU] via SUBCUTANEOUS
  Filled 2021-11-21 (×10): qty 1

## 2021-11-21 MED ORDER — POLYVINYL ALCOHOL 1.4 % OP SOLN
1.0000 [drp] | OPHTHALMIC | Status: DC | PRN
  Filled 2021-11-21: qty 15

## 2021-11-21 MED ORDER — DULOXETINE HCL 60 MG PO CPEP: 60.0000 mg | ORAL_CAPSULE | Freq: Every day | ORAL | Status: DC

## 2021-11-21 MED ORDER — SODIUM CHLORIDE 0.9 % IV SOLN
500.0000 mg | Freq: Once | INTRAVENOUS | Status: AC
  Administered 2021-11-21: 500 mg via INTRAVENOUS
  Filled 2021-11-21: qty 5

## 2021-11-21 MED ORDER — VANCOMYCIN HCL IN DEXTROSE 1-5 GM/200ML-% IV SOLN: 1000.0000 mg | Freq: Once | INTRAVENOUS | Status: DC

## 2021-11-21 MED ORDER — DULOXETINE HCL 30 MG PO CPEP
60.0000 mg | ORAL_CAPSULE | Freq: Every day | ORAL | Status: DC
Start: 2021-11-22 — End: 2021-11-25
  Administered 2021-11-22 – 2021-11-24 (×3): 60 mg via ORAL
  Filled 2021-11-21: qty 1
  Filled 2021-11-21 (×2): qty 2

## 2021-11-21 MED ORDER — CLOPIDOGREL BISULFATE 75 MG PO TABS
75.0000 mg | ORAL_TABLET | Freq: Every day | ORAL | Status: DC
Start: 2021-11-22 — End: 2021-11-25
  Administered 2021-11-22 – 2021-11-24 (×3): 75 mg via ORAL
  Filled 2021-11-21 (×3): qty 1

## 2021-11-21 MED ORDER — CLOPIDOGREL BISULFATE 75 MG PO TABS: 75.0000 mg | ORAL_TABLET | Freq: Every day | ORAL | Status: DC

## 2021-11-21 MED ORDER — IOHEXOL 300 MG/ML  SOLN
100.0000 mL | Freq: Once | INTRAMUSCULAR | Status: AC | PRN
  Administered 2021-11-21: 100 mL via INTRAVENOUS

## 2021-11-21 MED ORDER — SODIUM CHLORIDE 0.9 % IV SOLN
2.0000 g | Freq: Once | INTRAVENOUS | Status: AC
  Administered 2021-11-21: 2 g via INTRAVENOUS
  Filled 2021-11-21: qty 2

## 2021-11-21 MED ORDER — ACETAMINOPHEN 325 MG PO TABS: 650.0000 mg | ORAL_TABLET | Freq: Four times a day (QID) | ORAL | Status: DC | PRN

## 2021-11-21 MED ORDER — LEVOTHYROXINE SODIUM 50 MCG PO TABS: 50.0000 ug | ORAL_TABLET | Freq: Every day | ORAL | Status: DC

## 2021-11-21 MED ORDER — NITROGLYCERIN 0.4 MG SL SUBL: 0.4000 mg | SUBLINGUAL_TABLET | SUBLINGUAL | Status: DC | PRN

## 2021-11-21 MED ORDER — LACTATED RINGERS IV SOLN: INTRAVENOUS | Status: DC

## 2021-11-21 MED ORDER — FAMOTIDINE 20 MG PO TABS: 20.0000 mg | ORAL_TABLET | Freq: Every evening | ORAL | Status: DC | PRN

## 2021-11-21 MED ORDER — LEVOTHYROXINE SODIUM 50 MCG PO TABS
50.0000 ug | ORAL_TABLET | Freq: Every day | ORAL | Status: DC
  Administered 2021-11-22 – 2021-11-24 (×3): 50 ug via ORAL
  Filled 2021-11-21 (×3): qty 1

## 2021-11-21 MED ORDER — MELATONIN 5 MG PO TABS
5.0000 mg | ORAL_TABLET | Freq: Every day | ORAL | Status: DC
Start: 2021-11-21 — End: 2021-11-25
  Administered 2021-11-21 – 2021-11-23 (×3): 5 mg via ORAL
  Filled 2021-11-21 (×3): qty 1

## 2021-11-21 MED ORDER — VANCOMYCIN HCL IN DEXTROSE 1-5 GM/200ML-% IV SOLN
1000.0000 mg | Freq: Once | INTRAVENOUS | Status: AC
  Administered 2021-11-21: 1000 mg via INTRAVENOUS
  Filled 2021-11-21: qty 200

## 2021-11-21 MED ORDER — SODIUM CHLORIDE 0.9 % IV SOLN: 2.0000 g | Freq: Once | INTRAVENOUS | Status: DC

## 2021-11-21 MED ORDER — ONDANSETRON HCL 4 MG/2ML IJ SOLN: 4.0000 mg | INTRAMUSCULAR | Status: DC | PRN

## 2021-11-21 MED ORDER — PRAMIPEXOLE DIHYDROCHLORIDE 1 MG PO TABS
1.0000 mg | ORAL_TABLET | Freq: Every day | ORAL | Status: DC
Start: 2021-11-21 — End: 2021-11-25
  Administered 2021-11-21 – 2021-11-24 (×4): 1 mg via ORAL
  Filled 2021-11-21 (×5): qty 1

## 2021-11-21 MED ORDER — SODIUM CHLORIDE 0.9 % IV SOLN: 500.0000 mg | Freq: Once | INTRAVENOUS | Status: DC

## 2021-11-21 MED ORDER — TAMSULOSIN HCL 0.4 MG PO CAPS
0.4000 mg | ORAL_CAPSULE | Freq: Every day | ORAL | Status: DC
  Administered 2021-11-22 – 2021-11-24 (×3): 0.4 mg via ORAL
  Filled 2021-11-21 (×3): qty 1

## 2021-11-21 MED ORDER — SODIUM CHLORIDE 0.9 % IV BOLUS
500.0000 mL | Freq: Once | INTRAVENOUS | Status: AC
Start: 2021-11-21 — End: 2021-11-21
  Administered 2021-11-21: 500 mL via INTRAVENOUS

## 2021-11-21 NOTE — ED Notes (Signed)
Attempted to collect urine prior to starting ABX. Pt unable to provide sample at this time. ?

## 2021-11-21 NOTE — ED Triage Notes (Signed)
From [facility] due emesis X 2 930AM today. Given '4mg'$  zofran PIV and 250cc NS. Alert and oriented X 4. Temperature of 100.2 ? CBG 241 ?

## 2021-11-21 NOTE — ED Notes (Signed)
Pt back from MRI. Pt placed back on cardiac monitor. Pt resting comfortably with NAD.  ?

## 2021-11-21 NOTE — H&P (Signed)
History and Physical    James Spurr, MD IRJ:[id number] DOB: [birth year] DOA: 11/21/2021  PCP: [name], MD  Patient coming from: Home  Chief Complaint: Nausea , vomiting and cough.  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 Patient reported that her symptoms started around 2 days ago. Reports to have had at least 3-4 episodes each day with decreased appetite . He states that he was still able to consume some diet but for the most part was feeling extremely nauseous.  This morning he got up and had at least 2-3 more episodes of nausea and vomiting and he feels that 1 of those episodes he could have aspirated.  Following that episode he started experiencing gurgling sensation in his throat with some mildly difficulty in breathing.  Patient denies having any significant abdominal pain or diarrhea.  He reports that he has never had these episodes before.  He denies having consumed any food from outside.  Patient also reports that in the last 2 to 3 days he has been feeling extremely weak, he lives at home alone and was still able to ambulate with some help but of late he has physical therapy evaluation revealed that the patient has high risk of falling and the physical therapist has advised to supervision during his ambulation.  Patient came to the emergency room was evaluated ER physician with a CT scan of the abdomen and pelvis that was negative for any acute findings.  Normal WBC count with a hemoglobin that was added 2 g higher than his baseline, mild hyponatremia and slightly elevated creatinine.  Patient was also evaluated with a chest x-ray that was negative for any acute findings.  Patient currently denies having any nausea  reports that Zofran has helped him.  Patient is being admitted by the hospitalist team for further evaluation and management.      Review of Systems: As per HPI otherwise all other systems reviewed and are negative.  Past Medical History:  Diagnosis Date   Anemia    Basal cell carcinoma 2018   R temple    Basal cell carcinoma of nose 2002   bridge of nose   BPH with obstruction/lower urinary tract symptoms    failed flomax, uroxatral, myrbetriq - sees urology [name]) pending videourodynamics   CKD (chronic kidney disease) stage 3, GFR 30-59 ml/min ([place]) 03/11/2012   Renal US 02/2014 - multiple kidney cysts    CVA (cerebral infarction) 03/07/12   slurred speech; right hemplegia, left handed - completed PT 07/2014 (17 sessions)   Depression    mild, on cymbalta per prior PCP   Erectile dysfunction    per urology (prostaglandin injection)   GERD (gastroesophageal reflux disease)    with sliding HH   H/O hiatal hernia    History of kidney problems 12/2010   lacerated left kidney after fall   HLD (hyperlipidemia)    Hx of basal cell carcinoma 07/10/2017   R. lat. forehead near hair line   Kidney cysts 02/2014   bilateral (renal US)   Migraines 03/11/12   now rare   OSA on CPAP    sleep study 01/2014 - severe, CPAP at 10, previously on nuvigil (Dr. [name])   Palpitations 01/2012   holter WNL, rare  PAC/PVCs   Presbycusis of both ears 2016   rec amplification [facility])   Restless leg syndrome    well controlled on mirapex   Rosacea    [place]   Stroke [facility])    TIA (transient ischemic attack)    Type 2 diabetes, controlled, with neuropathy ([place]) 2011   [name] in [facility]    Past Surgical History:  Procedure Laterality Date   ABI  2007   WNL   CARDIAC CATHETERIZATION  2004   normal; [place]   CARDIOVASCULAR STRESS TEST  02/2014   WNL, low risk scan, EF 68%   CAROTID ENDARTERECTOMY     CATARACT EXTRACTION W/ INTRAOCULAR LENS IMPLANT  11//2012 - 08/2011    COLONOSCOPY  03/2011   poor prep, unable to biopsy polyp in tic fold, rpt 3 mo [name])   COLONOSCOPY  06/2011   mult polyps adenomatous, severe diverticulosis, rpt 3 yrs [name])   CYSTOSCOPY WITH URETHRAL DILATATION N/A 07/24/2013   Procedure: CYSTOSCOPY WITH URETHRAL DILATATION;  Surgeon: [name], MD;  Location: [facility];  Service: Urology;  Laterality: N/A;   ESOPHAGOGASTRODUODENOSCOPY  2006   duodenitis, medual HH, Grade 2 reflux, mild gastritis   ESOPHAGOGASTRODUODENOSCOPY  07/2011   mild chronic gastritis   HEMORRHOID SURGERY  1991   INGUINAL HERNIA REPAIR  1981; 2002   left, then repaired   MRI brain  04/2010   WNL   PROSTATE SURGERY  02/2009   PVP (laser)   SKIN CANCER EXCISION  2002   bridge of nose, [facility]   TONSILLECTOMY AND ADENOIDECTOMY  ~ [place] N/A 07/24/2013   Procedure: TRANSURETHRAL INCISION OF THE PROSTATE (TUIP);  Surgeon: [name], MD;  Location: [facility];  Service: Urology;  Laterality: N/A;   URETHRAL DILATION  2014   [place]    Social History  reports that he has never smoked. He has never used smokeless tobacco. He reports current alcohol use of about 4.0 standard drinks per week. He reports that he does not use drugs.  Allergies  Allergen Reactions   Dulaglutide     Other reaction(s): upset stomach Other reaction(s): Abdominal Pain, upset stomach Other reaction(s): upset stomach    Empagliflozin Other (See Comments)    Other reaction(s): yeast infection Other reaction(s): Other (See Comments) Other reaction(s): yeast infection    Lisinopril Cough    tachycardia   Nexium [Esomeprazole] Other (See Comments)    Headache   Oxycodone Other (See Comments)    Even 1/2 tablet of '5mg'$  caused increased confusion - contributory to hospitalization 10/2020 with AMS   Pregabalin Other (See Comments) and Nausea Only    Other reaction(s): MALAISE, SLIGHT NAUSEA, SLIGHT HEADACHE Other reaction(s): MALAISE,  SLIGHT NAUSEA, SLIGHT HEADACHE    Rosuvastatin Other (See Comments)    Other reaction(s): muscle pain 2.16.2022 Pt is current on this medication without any issues. Other reaction(s): muscle pain 2.16.2022 Pt is current on this medication without any issues. Other reaction(s): muscle pain    Family History  Problem Relation Age of Onset   Stroke Father    Lung cancer Father 25        smoker   Alcoholism Sister    Kidney disease Sister    Stroke Brother    Diabetes Brother 40   Lung cancer Brother    Coronary artery disease Neg Hx    Sleep apnea Neg Hx      Prior to Admission medications   Medication Sig  Start Date End Date Taking? Authorizing Provider  Alpha-Lipoic Acid 600 MG CAPS Take 1 capsule (600 mg total) by mouth daily. 06/28/21  Yes [name], MD  celecoxib (CELEBREX) 200 MG capsule Take 200 mg by mouth daily. 08/27/20  Yes [provider]  clopidogrel (PLAVIX) 75 MG tablet TAKE 1 TABLET DAILY 01/11/21  Yes [name], MD  Coenzyme Q10 (COQ10) 150 MG CAPS Take 1 capsule by mouth daily.   Yes [provider]  DULoxetine (CYMBALTA) 60 MG capsule Take 1 capsule (60 mg total) by mouth daily. 07/27/21  Yes [name], MD  famotidine (PEPCID) 20 MG tablet Take 1 tablet (20 mg total) by mouth at bedtime. 04/05/21  Yes Hosie Poisson, MD  gabapentin (NEURONTIN) 300 MG capsule Take 600 mg by mouth 4 (four) times daily. 600 mg BID is his minimum dose without having breakthrough symptoms. He is aware 900 mg/day is recommended per kidney function. 11/15/20  Yes [name], MD  insulin aspart (NOVOLOG) 100 UNIT/ML injection CBG 70 - 120: 0 units  CBG 121 - 150: 1 unit  CBG 151 - 200: 2 units  CBG 201 - 250: 3 units  CBG 251 - 300: 5 units  CBG 301 - 350: 7 units  CBG 351 - 400: 9 units 04/05/21  Yes Hosie Poisson, MD  insulin glargine (LANTUS) 100 UNIT/ML injection Inject 0.2 mLs (20 Units total) into the skin at bedtime. 11/02/21  Yes  Eugenie Filler, MD  levothyroxine (SYNTHROID) 50 MCG tablet Take 1 tablet (50 mcg total) by mouth daily before breakfast. Per endo [name] 12/07/19  Yes [name], MD  melatonin 5 MG TABS Take 5 mg by mouth at bedtime.   Yes [provider]  pantoprazole (PROTONIX) 40 MG tablet Take 40 mg by mouth daily. 11/02/21  Yes [provider]  pramipexole (MIRAPEX) 1 MG tablet Take 1 tablet (1 mg total) by mouth daily. At 4pm 07/27/21  Yes [name], MD  tamsulosin [facility]) 0.4 MG CAPS capsule Take 1 capsule (0.4 mg total) by mouth daily after breakfast. 07/27/21  Yes [name], MD  vitamin C (ASCORBIC ACID) 500 MG tablet Take 1,000 mg by mouth daily as needed.   Yes [provider]  acetaminophen (TYLENOL) 325 MG tablet Take 2 tablets (650 mg total) by mouth every 6 (six) hours as needed for mild pain (or Fever >/= 101). 11/02/21   Eugenie Filler, MD  Insulin Pen Needle (ULTICARE MICRO PEN NEEDLES) 31G X 8 MM MISC Use to inject medication daily 07/27/21   [name], MD  nitroGLYCERIN (NITROSTAT) 0.4 MG SL tablet DISSOLVE 1 TABLET UNDER THE TONGUE EVERY 5 MINUTES AS NEEDED FOR CHEST PAIN 07/28/16   [name], MD  ondansetron (ZOFRAN) 4 MG tablet Take 1 tablet (4 mg total) by mouth every 8 (eight) hours as needed for nausea or vomiting. 11/10/20   [name], MD  polyethylene glycol (MIRALAX / GLYCOLAX) 17 g packet Take 17 g by mouth daily as needed. 04/05/21   Hosie Poisson, MD  RABEprazole (ACIPHEX) 20 MG tablet Take 1 tablet (20 mg total) by mouth daily. Patient not taking: Reported on 11/21/2021 07/27/21   [name], MD  sodium chloride (MURO 128) 5 % ophthalmic solution 1 drop as needed for eye irritation.    [provider]    Physical Exam: Vitals:   11/21/21 1630 11/21/21 1700 11/21/21 1730 11/21/21 1800  BP: 125/63 (!) 123/55 (!) 121/55 (!) 111/54  Pulse: 89  86 89 89  Resp: (!) '22 16 19 15  '$ Temp:       TempSrc:      SpO2: 97% 95% 97% 96%  Weight:      Height:        Constitutional: Patient appears to be 25 mg weak and fatigued. Vitals:   11/21/21 1630 11/21/21 1700 11/21/21 1730 11/21/21 1800  BP: 125/63 (!) 123/55 (!) 121/55 (!) 111/54  Pulse: 89 86 89 89  Resp: (!) '22 16 19 15  '$ Temp:      TempSrc:      SpO2: 97% 95% 97% 96%  Weight:      Height:       Eyes: PERRL, lids and conjunctivae normal ENMT: Mucous membranes are moist. Posterior pharynx clear of any exudate or lesions.Normal dentition.  Neck: normal, supple, no masses, no thyromegaly Respiratory: Bilateral equal air entry, no active wheezing or rhonchi appreciated, gurgling noted at the throat area  Cardiovascular: Regular rate and rhythm, no murmurs / rubs / gallops. No extremity edema. 2+ pedal pulses. No carotid bruits.  Abdomen: no tenderness, no masses palpated. No hepatosplenomegaly. Bowel sounds positive.  Musculoskeletal: no clubbing / cyanosis. No joint deformity upper and lower extremities. Good ROM, no contractures. Normal muscle tone.  Skin: no rashes, lesions, ulcers. No induration, dry. Neurologic: CN 2-12 grossly intact. Sensation intact, DTR normal. Strength 5/5 in all 4.  Psychiatric: Normal judgment and insight. Alert and oriented x 3. Normal mood.    Labs on Admission: I have personally reviewed following labs and imaging studies  CBC: Recent Labs  Lab 11/21/21 1049  WBC 9.0  NEUTROABS 7.0  HGB 14.0  HCT 42.2  MCV 88.3  PLT 147    Basic Metabolic Panel: Recent Labs  Lab 11/21/21 1049  NA 134*  K 4.0  CL 104  CO2 22  GLUCOSE 218*  BUN 18  CREATININE 1.37*  CALCIUM 8.4*    GFR: Estimated Creatinine Clearance: 40.2 mL/min (A) (by C-G formula based on SCr of 1.37 mg/dL (H)).  Liver Function Tests: Recent Labs  Lab 11/21/21 1049  AST 20  ALT 10  ALKPHOS 49  BILITOT 1.2  PROT 7.0  ALBUMIN 3.3*    Urine analysis:    Component Value Date/Time   COLORURINE YELLOW (A)  11/21/2021 1344   APPEARANCEUR CLEAR (A) 11/21/2021 1344   LABSPEC 1.017 11/21/2021 1344   PHURINE 5.0 11/21/2021 1344   GLUCOSEU 50 (A) 11/21/2021 1344   GLUCOSEU >=1000 (A) 10/17/2021 1509   HGBUR NEGATIVE 11/21/2021 1344   BILIRUBINUR NEGATIVE 11/21/2021 1344   BILIRUBINUR negative 10/18/2020 0902   KETONESUR NEGATIVE 11/21/2021 1344   PROTEINUR 30 (A) 11/21/2021 1344   UROBILINOGEN 0.2 10/17/2021 1509   NITRITE NEGATIVE 11/21/2021 1344   LEUKOCYTESUR NEGATIVE 11/21/2021 1344    Radiological Exams on Admission: CT HEAD WO CONTRAST (5MM)  Result Date: 11/21/2021 CLINICAL DATA:  Mental status change, unknown cause. EXAM: CT HEAD WITHOUT CONTRAST TECHNIQUE: Contiguous axial images were obtained from the base of the skull through the vertex without intravenous contrast. RADIATION DOSE REDUCTION: This exam was performed according to the departmental dose-optimization program which includes automated exposure control, adjustment of the mA and/or kV according to patient size and/or use of iterative reconstruction technique. COMPARISON:  Prior head CT examinations 10/25/2021 and earlier. Brain MRI 08/26/2021. FINDINGS: There is residual circulating contrast from the abdominopelvic CT performed earlier today. Brain: Mild generalized cerebral atrophy. Redemonstrated chronic infarct within the left corona radiata/internal capsule and  lentiform nucleus. Background mild patchy and ill-defined hypoattenuation within the cerebral white matter, nonspecific but compatible chronic small vessel ischemic disease. There is no acute intracranial hemorrhage. No demarcated cortical infarct. No extra-axial fluid collection. No evidence of an intracranial mass. No midline shift. Vascular: Residual circulating contrast limits evaluation for hyperdense vessels. Atherosclerotic calcification. Skull: Normal. Negative for fracture or focal lesion. Sinuses/Orbits: Visualized orbits show no acute finding. Trace mucosal  thickening within the bilateral frontoethmoidal recesses and anterior ethmoid air cells. Other: Redemonstrated subcentimeter retained metallic foreign body within the frontal calvarium/scalp. IMPRESSION: No evidence of acute intracranial abnormality. Redemonstrated chronic infarct within the left corona radiata/internal capsule and left basal ganglia. Background mild chronic small vessel ischemic changes within the cerebral white matter. Mild generalized cerebral atrophy. Electronically Signed   By: Kellie Simmering D.O.   On: 11/21/2021 14:26   MR BRAIN WO CONTRAST  Result Date: 11/21/2021 CLINICAL DATA:  Stroke, follow up EXAM: MRI HEAD WITHOUT CONTRAST TECHNIQUE: Multiplanar, multiecho pulse sequences of the brain and surrounding structures were obtained without intravenous contrast. COMPARISON:  08/26/2021 FINDINGS: Brain: There is no acute infarction or intracranial hemorrhage. There is no intracranial mass, mass effect, or edema. There is no hydrocephalus or extra-axial fluid collection. Prominence of the ventricles and sulci reflects similar parenchymal volume loss. Patchy and confluent areas of T2 hyperintensity in the supratentorial white matter are nonspecific probably reflect similar chronic microvascular ischemic changes. There is a chronic small vessel infarct of the left corona radiata and basal ganglia. Vascular: Major vessel flow voids at the skull base are preserved. Skull and upper cervical spine: Normal marrow signal is preserved. Sinuses/Orbits: Paranasal sinuses are aerated. Bilateral lens replacements. Other: Sella is unremarkable.  Mastoid air cells are clear. IMPRESSION: No acute infarct, hemorrhage, or mass. No substantial change in chronic finding since prior study. Electronically Signed   By: Macy Mis M.D.   On: 11/21/2021 15:46   CT ABDOMEN PELVIS W CONTRAST  Result Date: 11/21/2021 CLINICAL DATA:  Nausea and vomiting.  Possible sepsis. EXAM: CT ABDOMEN AND PELVIS WITH CONTRAST  TECHNIQUE: Multidetector CT imaging of the abdomen and pelvis was performed using the standard protocol following bolus administration of intravenous contrast. RADIATION DOSE REDUCTION: This exam was performed according to the departmental dose-optimization program which includes automated exposure control, adjustment of the mA and/or kV according to patient size and/or use of iterative reconstruction technique. CONTRAST:  146m OMNIPAQUE IOHEXOL 300 MG/ML  SOLN COMPARISON:  CT abdomen dated April 02, 2021. CT abdomen pelvis dated July 01, 2020. FINDINGS: Lower chest: Patchy subsegmental atelectasis at the lung bases. Hepatobiliary: No focal liver abnormality is seen. No gallstones, gallbladder wall thickening, or biliary dilatation. Pancreas: Atrophic. No ductal dilatation or surrounding inflammatory changes. Spleen: Normal in size without focal abnormality. Adrenals/Urinary Tract: Adrenal glands are unremarkable. Unchanged punctate right renal calculus and bilateral renal cysts. No hydronephrosis. Bladder is unremarkable. Stomach/Bowel: Stomach is within normal limits. Diminutive or absent appendix. No evidence of bowel wall thickening, distention, or inflammatory changes. Moderate left-sided colonic diverticulosis. Vascular/Lymphatic: Aortic atherosclerosis. Unchanged 1.3 cm densely calcified splenic artery aneurysm. No enlarged abdominal or pelvic lymph nodes. Reproductive: Prostate is unremarkable. Other: Unchanged small fat containing left inguinal hernia. Prior right inguinal hernia repair. No free fluid or pneumoperitoneum. Musculoskeletal: No acute or significant osseous findings. IMPRESSION: 1. No acute intra-abdominal process. 2. Unchanged punctate right nephrolithiasis. 3. Aortic Atherosclerosis (ICD10-I70.0). Electronically Signed   By: WTitus DubinM.D.   On: 11/21/2021 12:15   MR FOOT  LEFT WO CONTRAST  Result Date: 11/21/2021 CLINICAL DATA:  Foot swelling, nondiabetic, osteomyelitis suspected.  EXAM: MRI OF THE LEFT FOOT WITHOUT CONTRAST TECHNIQUE: Multiplanar, multisequence MR imaging of the left was performed. No intravenous contrast was administered. COMPARISON:  Radiograph performed earlier on the same date FINDINGS: Multiple sequences are degraded due to motion. Bones/Joint/Cartilage No fracture or dislocation. Normal alignment. No joint effusion. No marrow signal abnormality. Area of concern in the proximal phalanx of the second digit was not included on this examination. Ligaments Evaluation of collateral ligaments is limited due to motion. Lisfranc ligament is intact. Muscles and Tendons Flexor, peroneal and extensor compartment tendons are intact. Muscles are normal. Soft tissue No fluid collection or hematoma. No soft tissue mass. Subcutaneous soft tissue edema about the dorsum of the foot. IMPRESSION: 1. Multiple sequences are limited due to motion. Within the limitation there is no MR evidence of acute osteomyelitis. Area of concern in the proximal phalanx of the second digit however was not included on this examination. If there is persistent clinical concern MR of the forefoot could be obtained for further evaluation. 2. Number soft tissue edema about the dorsum of the foot and about the ankle. Electronically Signed   By: Keane Police D.O.   On: 11/21/2021 16:16   DG Chest Port 1 View  Result Date: 11/21/2021 CLINICAL DATA:  Possible sepsis EXAM: PORTABLE CHEST 1 VIEW COMPARISON:  Previous studies including the examination of 10/25/2021 FINDINGS: Cardiac size is within normal limits. There is poor inspiration. There are no signs of alveolar pulmonary edema or focal pulmonary consolidation. There is no pleural effusion or pneumothorax. IMPRESSION: Crowding of markings in the lower lung fields may be due to poor inspiration. There are no signs of pulmonary edema or focal pulmonary consolidation. Electronically Signed   By: Elmer Picker M.D.   On: 11/21/2021 11:06   DG Foot Complete  Left  Result Date: 11/21/2021 CLINICAL DATA:  Open wound in the skin EXAM: LEFT FOOT - COMPLETE 3+ VIEW COMPARISON:  10/25/2021 FINDINGS: No fracture or dislocation is seen. In the oblique projection, there is possible small radiolucency in the medial aspect of distal shaft of proximal phalanx of the second toe. There are no radiopaque foreign bodies. IMPRESSION: No fracture or dislocation is seen. There is small radiolucency along the medial cortical margin of proximal phalanx of left second toe seen in the single view. If clinically warranted, follow-up MRI may be considered to rule out osteomyelitis. Electronically Signed   By: Elmer Picker M.D.   On: 11/21/2021 11:40      Assessment/Plan Principal Problem:   Nausea and vomiting    #1 nausea and vomiting:: - CT scan of the abdomen pelvis negative for any acute pathology. - Patient initiated on clear liquid diet, gentle IV fluids As needed Zofran. - Continue to monitor and advance diet as tolerated. - Abdominal physical exam also is nonfocal. - Patient has any recurrent nausea vomiting or diarrhea the should be tested for Noravirus as  an outbreak going on another state.  #2 severe generalized weakness/fatigue: - This could be secondary to underlying dehydration - Continue IV fluids - Check magnesium, phosphorus, B12, folate levels and vitamin D levels. - Physical therapy has been consulted.  #3 Suspicion for aspiration pneumonia: - Patient feels that he - Currently respiratory status is stable - Chest x-ray is negative for any acute findings - We will preemptively cover the patient with ceftriaxone 1 g every 24 hours daily. - If the  patient respiratory status continues to stable this could be discontinued later.  #4 history of diabetes mellitus: - Patient is currently on clear liquid diet - Place the patient on insulin sliding scale. - Currently no signs of hypoglycemia.  5.  History of CKD: - Creatinine 55. -  Continue gentle hydration through IV fluids.    DVT prophylaxis: Lovenox Code Status:   Full Code   Disposition Plan: Based on PT eval.      Carlyle Lipa MD [facility]  How to contact the [facility] Attending or Consulting provider [place] or covering provider during after hours [place], for this patient?   Check the care team in [facility] and look for a) attending/consulting [facility] provider listed and b) the [facility] team listed Log into [email or web address] and use [place] universal password to access. If you do not have the password, please contact the hospital operator. Locate the [facility] provider you are looking for under [facility] and page to a number that you can be directly reached. If you still have difficulty reaching the provider, please page the [facility] (Director on Call) for the Hospitalists listed on amion for assistance.  11/21/2021, 6:23 PM

## 2021-11-21 NOTE — ED Notes (Signed)
Pt has not had UO since the I&O cath at 1342. Pt has urge to void at this time, but is unable to do so. Pt bladder scanned and noted approx 230m.  ?

## 2021-11-21 NOTE — Consult Note (Signed)
PHARMACY -  BRIEF ANTIBIOTIC NOTE  ? ?Pharmacy has received consult(s) for cefepime and vancomycin from an ED provider. Patient is also ordered azithromycin. The patient's profile has been reviewed for ht/wt/allergies/indication/available labs.   ? ?One time order(s) placed for  ?--Cefepime 2 g IV ?--Vancomycin 1 g IV ? ?Further antibiotics/pharmacy consults should be ordered by admitting physician if indicated.       ?                ?Thank you, ?Benita Gutter ?11/21/2021  11:10 AM  ?

## 2021-11-21 NOTE — ED Provider Notes (Addendum)
? ?[facility] ?Provider Note ? ? ? Event Date/Time  ? First MD Initiated Contact with Patient 11/21/21 1043   ?  (approximate) ? ? ?History  ? ?Emesis ? ? ?HPI ? 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  Reportedly febrile prior to arrival.  Denies any dysuria does have some epigastric pain.  Reportedly hypoxic with EMS and does not wear home oxygen.  Review of discharge summary was just admitted for dehydration near syncope AKI and infection of the heel of left foot. ? ?  ? ? ?Physical Exam  ? ?Triage Vital Signs: ?ED Triage Vitals  ?Enc Vitals Group  ?   BP 11/21/21 1045 132/67  ?   Pulse Rate 11/21/21 1045 96  ?   Resp 11/21/21 1045 20  ?   Temp 11/21/21 1045 99.6 ?F (37.6 ?C)  ?   Temp Source 11/21/21 1045 Oral  ?   SpO2 11/21/21 1045 93 %  ?   Weight 11/21/21 1042 194 lb 0.1 oz (88 kg)  ?   Height 11/21/21 1042 '5\' 8"'$  (1.727 m)  ?   Head Circumference --   ?   Peak Flow --   ?   Pain Score 11/21/21 1042 0  ?   Pain Loc --   ?   Pain Edu? --   ?   Excl. in [place]? --   ? ? ?Most recent vital signs: ?Vitals:  ? 11/21/21 1330 11/21/21 1400  ?BP: 122/63 (!) 115/58  ?Pulse: 97 94  ?Resp: 17 18  ?Temp:    ?SpO2: 96% 91%  ? ? ? ?Constitutional: Alert, ill appearing ?Eyes: Conjunctivae are normal.  ?Head: Atraumatic. ?Nose: No congestion/rhinnorhea. ?Mouth/Throat: Mucous membranes are moist.   ?Neck: Painless ROM.  ?Cardiovascular:   Good peripheral circulation. ?Respiratory: Normal respiratory effort.  No retractions, scattered crackles throughout  ?Gastrointestinal: Soft and nontender.  ?Musculoskeletal:  no deformity ?Neurologic:  No gross focal neurologic deficits are appreciated.  ?Skin:  Skin is warm, dry and intact. No rash noted. ?Psychiatric: Mood and affect are normal. Speech and behavior are normal. ? ? ? ?ED Results /  Procedures / Treatments  ? ?Labs ?(all labs ordered are listed, but only abnormal results are displayed) ?Labs Reviewed  ?COMPREHENSIVE METABOLIC PANEL - Abnormal; Notable for the following components:  ?    Result Value  ? Sodium 134 (*)   ? Glucose, Bld 218 (*)   ? Creatinine, Ser 1.37 (*)   ? Calcium 8.4 (*)   ? Albumin 3.3 (*)   ? GFR, Estimated 50 (*)   ? All other components within normal limits  ?URINALYSIS, COMPLETE (UACMP) WITH MICROSCOPIC - Abnormal; Notable for the following components:  ? Color, Urine YELLOW (*)   ? APPearance CLEAR (*)   ? Glucose, UA 50 (*)   ? Protein, ur 30 (*)   ? Bacteria, UA RARE (*)   ? All other components within normal limits  ?RESP PANEL BY RT-PCR (FLU A&B, COVID) [id number]  ?CULTURE, BLOOD (ROUTINE X 2)  ?CULTURE, BLOOD (ROUTINE X 2)  ?LACTIC ACID, PLASMA  ?LACTIC ACID, PLASMA  ?CBC WITH DIFFERENTIAL/PLATELET  ?PROTIME-INR  ?APTT  ?TROPONIN I (HIGH SENSITIVITY)  ?TROPONIN I (HIGH SENSITIVITY)  ? ? ? ?EKG ? ?ED ECG REPORT ?Tyrone Nine, the attending physician, personally viewed and  interpreted this ECG. ? ? Date: 11/21/2021 ? EKG Time: 10:47 ? Rate: 95 ? Rhythm: sinus ? Axis: left ? Intervals:normal ? ST&T Change: poor r wave progression, no stemi ? ? ? ?RADIOLOGY ?Please see ED Course for my review and interpretation. ? ?I personally reviewed all radiographic images ordered to evaluate for the above acute complaints and reviewed radiology reports and findings.  These findings were personally discussed with the patient.  Please see medical record for radiology report. ? ? ? ?PROCEDURES: ? ?Critical Care performed: No ? ?Procedures ? ? ?MEDICATIONS ORDERED IN ED: ?Medications  ?lactated ringers infusion ( Intravenous New Bag/Given 11/21/21 1351)  ?lactated ringers infusion (0 mLs Intravenous Paused 11/21/21 1114)  ?vancomycin (VANCOCIN) IVPB 1000 mg/200 mL premix (0 mg Intravenous Stopped 11/21/21 1316)  ?ceFEPIme (MAXIPIME) 2 g in sodium chloride 0.9 % 100 mL IVPB (0 g  Intravenous Stopped 11/21/21 1205)  ?azithromycin (ZITHROMAX) 500 mg in sodium chloride 0.9 % 250 mL IVPB (0 mg Intravenous Stopped 11/21/21 1259)  ?sodium chloride 0.9 % bolus 500 mL (0 mLs Intravenous Stopped 11/21/21 1326)  ?iohexol (OMNIPAQUE) 300 MG/ML solution 100 mL (100 mLs Intravenous Contrast Given 11/21/21 1145)  ? ? ? ?IMPRESSION / MDM / ASSESSMENT AND PLAN / ED COURSE  ?I reviewed the triage vital signs and the nursing notes. ?             ?               ? ?Differential diagnosis includes, but is not limited to, Dehydration, sepsis, pna, uti, hypoglycemia, cva, drug effect, withdrawal, encephalitis ? ?Patient is ill-appearing protecting his airway does have new hypoxia requiring supplemental oxygen.  Blood work sent off due to concern for sepsis.  Chest x-ray ordered.  Will order CT imaging of the abdomen will give IV fluids. ? ?Clinical Course as of 11/21/21 1451  ?Mon Nov 21, 2021  ?1107 My review chest x-ray does not show any evidence of pneumothorax. [PR]  ?1156 No leukocytosis lactate normal [PR]  ?1446 Patient still seems somewhat drowsy I do appreciate crackles on the right lung fields question possible aspiration he is not hypoxic on room air currently.  Has received IV antibiotics for possible pneumonia.  Urinalysis without any significant infection.  Left heel does not appear acutely cellulitic or infected.  Given altered mental status will order MRI rule out stroke have a lower suspicion for this.  Patient's presentation discussed with the patient's POA he states that he is a DNR but agreeable otherwise full scope of treatment.  Have consulted hospitalist who is excepted patient to their service. [PR]  ?  ?Clinical Course User Index ?[PR] Merlyn Lot, MD  ? ? ? ?FINAL CLINICAL IMPRESSION(S) / ED DIAGNOSES  ? ?Final diagnoses:  ?Uncontrollable vomiting  ?Altered mental status, unspecified altered mental status type  ? ? ? ?Rx / DC Orders  ? ?ED Discharge Orders   ? ? None  ? ?  ? ? ? ?Note:   This document was prepared using Dragon voice recognition software and may include unintentional dictation errors. ? ?  ?Merlyn Lot, MD ?11/21/21 1449 ? ?  ?Merlyn Lot, MD ?11/21/21 1451 ? ?

## 2021-11-22 ENCOUNTER — Other Ambulatory Visit: Payer: Self-pay

## 2021-11-22 DIAGNOSIS — R112 Nausea with vomiting, unspecified: Secondary | ICD-10-CM

## 2021-11-22 LAB — GLUCOSE, CAPILLARY
Glucose-Capillary: 197 mg/dL — ABNORMAL HIGH (ref 70–99)
Glucose-Capillary: 195 mg/dL — ABNORMAL HIGH (ref 70–99)
Glucose-Capillary: 228 mg/dL — ABNORMAL HIGH (ref 70–99)

## 2021-11-22 LAB — PROCALCITONIN
Procalcitonin: 0.56 ng/mL
Procalcitonin: 0.67 ng/mL

## 2021-11-22 LAB — FOLATE: Folate: 15.2 ng/mL (ref >5.9)

## 2021-11-22 LAB — VITAMIN D 25 HYDROXY (VIT D DEFICIENCY, FRACTURES): Vit D, 25-Hydroxy: 29.23 ng/mL — ABNORMAL LOW (ref 30–100)

## 2021-11-22 LAB — CBG MONITORING, ED: Glucose-Capillary: 185 mg/dL — ABNORMAL HIGH (ref 70–99)

## 2021-11-22 LAB — PHOSPHORUS: Phosphorus: 3.1 mg/dL (ref 2.5–4.6)

## 2021-11-22 LAB — MAGNESIUM: Magnesium: 1.6 mg/dL — ABNORMAL LOW (ref 1.7–2.4)

## 2021-11-22 LAB — VITAMIN B12: Vitamin B-12: 368 pg/mL (ref 180–914)

## 2021-11-22 MED ORDER — MAGNESIUM SULFATE 2 GM/50ML IV SOLN
2.0000 g | Freq: Once | INTRAVENOUS | Status: AC
  Administered 2021-11-22: 2 g via INTRAVENOUS
  Filled 2021-11-22: qty 50

## 2021-11-22 MED ORDER — ENOXAPARIN SODIUM 40 MG/0.4ML IJ SOSY
40.0000 mg | PREFILLED_SYRINGE | INTRAMUSCULAR | Status: DC
  Administered 2021-11-22 – 2021-11-23 (×2): 40 mg via SUBCUTANEOUS
  Filled 2021-11-22 (×2): qty 0.4

## 2021-11-22 MED ORDER — SODIUM CHLORIDE 0.9 % IV SOLN
3.0000 g | Freq: Four times a day (QID) | INTRAVENOUS | Status: DC
  Administered 2021-11-22 – 2021-11-24 (×7): 3 g via INTRAVENOUS
  Filled 2021-11-22 (×5): qty 8
  Filled 2021-11-22: qty 3
  Filled 2021-11-22: qty 8
  Filled 2021-11-22: qty 3
  Filled 2021-11-22: qty 8
  Filled 2021-11-22 (×2): qty 3

## 2021-11-22 MED ORDER — LACTATED RINGERS IV SOLN: INTRAVENOUS | Status: DC

## 2021-11-22 MED ORDER — PANTOPRAZOLE SODIUM 40 MG PO TBEC
40.0000 mg | DELAYED_RELEASE_TABLET | Freq: Every day | ORAL | Status: DC
  Administered 2021-11-22 – 2021-11-24 (×3): 40 mg via ORAL
  Filled 2021-11-22 (×3): qty 1

## 2021-11-22 MED ORDER — IPRATROPIUM-ALBUTEROL 0.5-2.5 (3) MG/3ML IN SOLN
3.0000 mL | Freq: Four times a day (QID) | RESPIRATORY_TRACT | Status: DC
  Administered 2021-11-22: 3 mL via RESPIRATORY_TRACT
  Filled 2021-11-22: qty 3

## 2021-11-22 MED ORDER — GUAIFENESIN ER 600 MG PO TB12
1200.0000 mg | ORAL_TABLET | Freq: Two times a day (BID) | ORAL | Status: DC
  Administered 2021-11-22 – 2021-11-24 (×4): 1200 mg via ORAL
  Filled 2021-11-22 (×5): qty 2

## 2021-11-22 MED ORDER — IPRATROPIUM-ALBUTEROL 0.5-2.5 (3) MG/3ML IN SOLN
3.0000 mL | Freq: Three times a day (TID) | RESPIRATORY_TRACT | Status: DC
  Administered 2021-11-22 – 2021-11-23 (×2): 3 mL via RESPIRATORY_TRACT
  Filled 2021-11-22 (×2): qty 3

## 2021-11-22 MED ORDER — SODIUM CHLORIDE 3 % IN NEBU
4.0000 mL | INHALATION_SOLUTION | Freq: Two times a day (BID) | RESPIRATORY_TRACT | Status: DC
  Administered 2021-11-22 – 2021-11-24 (×4): 4 mL via RESPIRATORY_TRACT
  Filled 2021-11-22 (×6): qty 4

## 2021-11-22 MED ORDER — SODIUM CHLORIDE 0.9 % IV SOLN
3.0000 g | Freq: Four times a day (QID) | INTRAVENOUS | Status: DC
  Filled 2021-11-22 (×2): qty 8

## 2021-11-22 NOTE — Evaluation (Signed)
Clinical/Bedside Swallow Evaluation Patient Details  Name: James HINELY, MD MRN: [id number] Date of Birth: [birth year]  Today's Date: 11/22/2021 Time: SLP Start Time (ACUTE ONLY): 48 SLP Stop Time (ACUTE ONLY): 1520 SLP Time Calculation (min) (ACUTE ONLY): 60 min  Past Medical History:  Past Medical History:  Diagnosis Date   Anemia    Basal cell carcinoma 2018   R temple    Basal cell carcinoma of nose 2002   bridge of nose   BPH with obstruction/lower urinary tract symptoms    failed flomax, uroxatral, myrbetriq - sees urology [name]) pending videourodynamics   CKD (chronic kidney disease) stage 3, GFR 30-59 ml/min ([place]) 03/11/2012   Renal US 02/2014 - multiple kidney cysts    CVA (cerebral infarction) 03/07/12   slurred speech; right hemplegia, left handed - completed PT 07/2014 (17 sessions)   Depression    mild, on cymbalta per prior PCP   Erectile dysfunction    per urology (prostaglandin injection)   GERD (gastroesophageal reflux disease)    with sliding HH   H/O hiatal hernia    History of kidney problems 12/2010   lacerated left kidney after fall   HLD (hyperlipidemia)    Hx of basal cell carcinoma 07/10/2017   R. lat. forehead near hair line   Kidney cysts 02/2014   bilateral (renal US)   Migraines 03/11/12   now rare   OSA on CPAP    sleep study 01/2014 - severe, CPAP at 10, previously on nuvigil (Dr. [name])   Palpitations 01/2012   holter WNL, rare PAC/PVCs   Presbycusis of both ears 2016   rec amplification [facility])   Restless leg syndrome    well controlled on mirapex   Rosacea    [name]   Stroke [facility])    TIA (transient ischemic attack)    Type 2 diabetes, controlled, with neuropathy ([place]) 2011   [name] in [facility]   Past Surgical History:  Past Surgical History:  Procedure Laterality Date   ABI  2007   WNL   CARDIAC CATHETERIZATION  2004   normal; [place]   CARDIOVASCULAR STRESS TEST  02/2014   WNL, low risk scan, EF 68%   CAROTID  ENDARTERECTOMY     CATARACT EXTRACTION W/ INTRAOCULAR LENS IMPLANT  11//2012 - 08/2011   COLONOSCOPY  03/2011   poor prep, unable to biopsy polyp in tic fold, rpt 3 mo [name])   COLONOSCOPY  06/2011   mult polyps adenomatous, severe diverticulosis, rpt 3 yrs [name])   CYSTOSCOPY WITH URETHRAL DILATATION N/A 07/24/2013   Procedure: CYSTOSCOPY WITH URETHRAL DILATATION;  Surgeon: [name], MD;  Location: [facility] ORS;  Service: Urology;  Laterality: N/A;   ESOPHAGOGASTRODUODENOSCOPY  2006   duodenitis, medual HH, Grade 2 reflux, mild gastritis   ESOPHAGOGASTRODUODENOSCOPY  07/2011   mild chronic gastritis   HEMORRHOID SURGERY  1991   INGUINAL HERNIA REPAIR  1981; 2002   left, then repaired   MRI brain  04/2010   WNL   PROSTATE SURGERY  02/2009   PVP (laser)   SKIN CANCER EXCISION  2002   bridge of nose, [facility]   TONSILLECTOMY AND ADENOIDECTOMY  ~ [place] N/A 07/24/2013   Procedure: TRANSURETHRAL INCISION OF THE PROSTATE (TUIP);  Surgeon: [name], MD;  Location: [facility];  Service: Urology;  Laterality: N/A;   URETHRAL DILATION  2014   [place]   HPI:  Pt is a 88 y.o. male with  medical history significant of GERD, hiatal hernia, CVA with right-sided residual weakness in 2013, diabetes mellitus type 2, CKD stage III, obstructive sleep apnea on CPAP, migraine who presents to the emergency room with complaints of nausea, Vomiting and intermittent cough -- pt reported the episodes of n/V have been ongoing for "~2 months now".   Patient reported that current symptoms started around 2 days ago. Reports to have had at least 3-4 episodes each day of Vomiting with decreased appetite . He states that he was still able to consume some diet but for the most part was feeling extremely nauseous.  On the morning of admit, he got up and had at least 2-3 more episodes of nausea and Vomiting and he feels that 1 of those episodes he could have aspirated.  Following  that episode he started experiencing gurgling sensation in his throat with some mildly difficulty in breathing.  Patient denies having any significant abdominal pain or diarrhea.  He denies having consumed any food from outside.  Patient also reports that in the last 2 to 3 days he has been feeling extremely weak w/ Fall risk.   CT scan of the abdomen and pelvis that was negative for any acute findings.  Normal WBC count with mild hyponatremia and slightly elevated creatinine.  Patient was also evaluated with a chest x-ray that was negative for any acute findings.  Patient currently denies having any nausea reports that Zofran has helped him.  Patient is being admitted by the hospitalist team for further evaluation and management.   Pt lives at an independent living facility where he receives support with IADL management including cooking and cleaning at baseline.    Assessment / Plan / Recommendation  Clinical Impression  Pt appears to present w/ adequate oropharyngeal phase swallow function w/ No overt oropharyngeal phase dysphagia noted, No neuromuscular deficits impacting swallowing noted. Pt consumed po trials w/ No overt, clinical s/s of aspiration during/post the trials. Pt appears at reduced risk for aspiration when following general aspiration precautions w/ support of tray setup and positioning (weak RUE baseline).   OF NOTE: Pt has a [place] dx of GERD and Hiatal Hernia w/ reports of ongoing N/V episodes for "~2 months", per pt report; increased episodes of N/V in the past few days. ANY Dysmotility or Regurgitation of Reflux material can increase risk for aspiration of the Reflux material during Retrograde backflow thus impact Voicing and Pulmonary status.    During po trials, pt consumed all consistencies w/ no overt coughing, decline in vocal quality, or change in respiratory presentation during/post trials. Oral phase appeared [facility] w/ timely bolus management, mastication, and control of bolus  propulsion for A-P transfer for swallowing. No anterior leakage occurred. Oral clearing achieved w/ all trial consistencies. OM Exam appeared grossly [facility] w/ no unilateral lingual weakness noted; slight decreased tone in R upper lip(baseline). Speech Clear. Pt fed self given setup support.   Recommend a Regular consistency diet w/ well-Cut meats, moistened foods; Thin liquids VIA CUP - pt does not use straws at home. Recommend general aspiration precautions, Pills WHOLE in Puree for safer, easier swallowing as pt described Larger pills causing difficulty to swallow. Education given on Pills in Puree; food consistencies and easy to eat options; general aspiration precautions. NSG to reconsult if any new needs arise. NSG agreed. MD consulted and given recommendations for a PPI and f/u w/ GI for further management d/t frequency of N/V (in order to hopefully address and reduce frequency of). SLP Visit  Diagnosis: Dysphagia, unspecified (R13.10) (GERD)    Aspiration Risk   (reduced following general aspiration precautions; REFLUX precautions)    Diet Recommendation   Regular consistency diet w/ well-Cut meats, moistened foods; Thin liquids VIA CUP - pt does not use straws at home. Recommend general aspiration precautions; REFLUX/GERD precautions   Medication Administration: Whole meds with puree (for safer swallowing)    Other  Recommendations Recommended Consults: Consider GI evaluation;Consider esophageal assessment Oral Care Recommendations: Oral care BID;Oral care before and after PO;Patient independent with oral care (setup support) Other Recommendations:  (n/a)    Recommendations for follow up therapy are one component of a multi-disciplinary discharge planning process, led by the attending physician.  Recommendations may be updated based on patient status, additional functional criteria and insurance authorization.  Follow up Recommendations No SLP follow up      Assistance Recommended at  Discharge Set up Supervision/Assistance  Functional Status Assessment Patient has not had a recent decline in their functional status (not in oropharyngeal swallowing)  Frequency and Duration  (n/a)   (n/a)       Prognosis Prognosis for Safe Diet Advancement: Good Barriers to Reach Goals: Time post onset;Severity of deficits Barriers/Prognosis Comment: GI Dysmotility - pt has had recent increased episodes of N/V      Swallow Study   General Date of Onset: 11/21/21 HPI: Pt is a 88 y.o. male with medical history significant of GERD, hiatal hernia, CVA with right-sided residual weakness in 2013, diabetes mellitus type 2, CKD stage III, obstructive sleep apnea on CPAP, migraine who presents to the emergency room with complaints of nausea, Vomiting and intermittent cough -- pt reported the episodes of n/V have been ongoing for "~2 months now".   Patient reported that current symptoms started around 2 days ago. Reports to have had at least 3-4 episodes each day of Vomiting with decreased appetite . He states that he was still able to consume some diet but for the most part was feeling extremely nauseous.  On the morning of admit, he got up and had at least 2-3 more episodes of nausea and Vomiting and he feels that 1 of those episodes he could have aspirated.  Following that episode he started experiencing gurgling sensation in his throat with some mildly difficulty in breathing.  Patient denies having any significant abdominal pain or diarrhea.  He denies having consumed any food from outside.  Patient also reports that in the last 2 to 3 days he has been feeling extremely weak w/ Fall risk.   CT scan of the abdomen and pelvis that was negative for any acute findings.  Normal WBC count with mild hyponatremia and slightly elevated creatinine.  Patient was also evaluated with a chest x-ray that was negative for any acute findings.  Patient currently denies having any nausea reports that Zofran has helped him.   Patient is being admitted by the hospitalist team for further evaluation and management.  Pt lives at an independent living facility where he receives support with IADL management including cooking and cleaning at baseline. Type of Study: Bedside Swallow Evaluation Previous Swallow Assessment: none Diet Prior to this Study: NPO (regular diet at home) Temperature Spikes Noted: No (wbc 9.0) Respiratory Status: Room air History of Recent Intubation: No Behavior/Cognition: Alert;Cooperative;Pleasant mood;Requires cueing (min) Oral Cavity Assessment: Dry Oral Care Completed by SLP: Yes Oral Cavity - Dentition: Adequate natural dentition Vision: Functional for self-feeding Self-Feeding Abilities: Able to feed self;Needs set up Patient Positioning: Upright in bed (needed  positioning support) Baseline Vocal Quality: Normal Volitional Cough: Strong Volitional Swallow: Able to elicit    Oral/Motor/Sensory Function Overall Oral Motor/Sensory Function: Within functional limits (slight decreased upper R labial tone at rest -- from previous stroke in 2013)   Ice Chips Ice chips: Within functional limits Presentation: Spoon (fed; 2 trials)   Thin Liquid Thin Liquid: Within functional limits Presentation: Cup;Self Fed (12+ trials)    Nectar Thick Nectar Thick Liquid: Not tested   Honey Thick Honey Thick Liquid: Not tested   Puree Puree: Within functional limits Presentation: Self Fed;Spoon (~3 ozs)   Solid     Solid: Within functional limits Presentation: Self Fed (9 trials)         Orinda Kenner, MS, CCC-SLP Speech Language Pathologist Rehab Services; [place] [phone] (ascom) Jasnoor Trussell 11/22/2021,5:10 PM

## 2021-11-22 NOTE — Evaluation (Signed)
Occupational Therapy Evaluation ?Patient Details ?Name: James Spurr, MD ?MRN: [id number] ?DOB: February 16, 1931 ?Today's Date: 11/22/2021 ? ? ?History of Present Illness Patient is an 88 year old male with a PMH (+) for CKD, CVA, GERD, recent admission for cellulitis wound infection presents to the ER for nausea vomiting and generalized malaise.  ? ?Clinical Impression ?  ?[name] was seen for OT evaluation this date. Prior to hospital admission, pt was receiving care in a STR where he had been for ~3 weeks. Pt lives at an independent living facility where he receives support with IADL management including cooking and cleaning at baseline. Currently pt demonstrates impairments as described below (See OT problem list) which functionally limit his ability to perform ADL/self-care tasks. Pt currently requires MIN A for bed/functional mobility, MOD A for LB ADL management, and SET UP assist for UB ADL management in seated position.  Pt would benefit from skilled OT services to address noted impairments and functional limitations (see below for any additional details) in order to maximize safety and independence while minimizing falls risk and caregiver burden. Upon hospital discharge, recommend STR to maximize pt safety and return to PLOF.  ?  ?   ? ?Recommendations for follow up therapy are one component of a multi-disciplinary discharge planning process, led by the attending physician.  Recommendations may be updated based on patient status, additional functional criteria and insurance authorization.  ? ?Follow Up Recommendations ? Skilled nursing-short term rehab (<3 hours/day)  ?  ?Assistance Recommended at Discharge Frequent or constant Supervision/Assistance  ?Patient can return home with the following A lot of help with walking and/or transfers;A little help with bathing/dressing/bathroom;Help with stairs or ramp for entrance;Assist for transportation;Assistance with cooking/housework ? ?  ?Functional Status  Assessment ? Patient has had a recent decline in their functional status and demonstrates the ability to make significant improvements in function in a reasonable and predictable amount of time.  ?Equipment Recommendations ? BSC/3in1  ?  ?Recommendations for Other Services   ? ? ?  ?Precautions / Restrictions Precautions ?Precautions: Fall ?Restrictions ?Weight Bearing Restrictions: No  ? ?  ? ?Mobility Bed Mobility ?Overal bed mobility: Needs Assistance ?Bed Mobility: Supine to Sit ?  ?  ?Supine to sit: Min assist ?  ?  ?General bed mobility comments: MIN A for trunk elevation. ?  ? ?Transfers ?Overall transfer level: Needs assistance ?Equipment used: Rolling walker (2 wheels) ?Transfers: Sit to/from Stand ?Sit to Stand: Min assist, From elevated surface ?Stand pivot transfers: Min assist ?  ?  ?  ?  ?General transfer comment: HHA to SPT to recliner from EOB. ?  ? ?  ?Balance Overall balance assessment: Needs assistance ?Sitting-balance support: Bilateral upper extremity supported, Feet unsupported ?Sitting balance-Leahy Scale: Fair ?Sitting balance - Comments: Pt is able to seit OB without feet suppor with supervision for safety. ?  ?Standing balance support: Single extremity supported, During functional activity ?Standing balance-Leahy Scale: Fair ?  ?  ?  ?  ?  ?  ?  ?  ?  ?  ?  ?  ?   ? ?ADL either performed or assessed with clinical judgement  ? ?ADL Overall ADL's : Needs assistance/impaired ?  ?  ?  ?  ?  ?  ?  ?  ?  ?  ?  ?  ?  ?  ?  ?  ?  ?  ?  ?General ADL Comments: MIN A for SUP>SIT and STS from EOB (gurney bed); +1  HHA with MIN A for SPT to recliner. Anticipate MOD A for LB ADL management in seated position. SET UP/SUP for UB ADL management in seated position.  ? ? ? ?Vision Patient Visual Report: No change from baseline ?   ?   ?Perception   ?  ?Praxis   ?  ? ?Pertinent Vitals/Pain Pain Assessment ?Faces Pain Scale: Hurts a little bit ?Pain Location: abdomen/throat with coughing. ?Pain Descriptors /  Indicators: Sore ?Pain Intervention(s): Limited activity within patient's tolerance, Repositioned, Monitored during session  ? ? ? ?Hand Dominance Left (2/2 hx of CVA) ?  ?Extremity/Trunk Assessment Upper Extremity Assessment ?Upper Extremity Assessment: Generalized weakness ?RUE Deficits / Details: Decr Active shoulder flexion, elbow flexion/extension at baseline. Grip/FMC WFL. Grossly 3/5. ?LUE Deficits / Details: at least 3+/5 strength LUE ?  ?Lower Extremity Assessment ?Lower Extremity Assessment: Generalized weakness ?RLE Deficits / Details: 2+/5 R DF; at least 3/5 strength with hip flexion, knee flexion, and knee extension ?  ?Cervical / Trunk Assessment ?Cervical / Trunk Assessment: Normal ?  ?Communication Communication ?Communication: HOH ?  ?Cognition Arousal/Alertness: Awake/alert ?Behavior During Therapy: [facility] for tasks assessed/performed ?Overall Cognitive Status: Within Functional Limits for tasks assessed ?  ?  ?  ?  ?  ?  ?  ?  ?  ?  ?  ?  ?  ?  ?  ?  ?General Comments: Pt is A&Ox4, pleasant, conversational t/o session. ?  ?  ?General Comments    ? ?  ?Exercises Other Exercises ?Other Exercises: Pt educated on role of OT in acute setting, safe use of AE/DME for ADL management, routines modifications to support safety and functional indep upon hospital DC. ?  ?Shoulder Instructions    ? ? ?Home Living Family/patient expects to be discharged to:: Skilled nursing facility ?  ?  ?  ?  ?  ?  ?  ?  ?  ?  ?  ?  ?  ?  ?Home Equipment: Rollator (4 wheels);Rolling Walker (2 wheels);Other (comment) ?  ?Additional Comments: Lives at [facility]); had recently gone to [facility]) and per pt was preparing to DC back to [facility] just PTA. ?  ? ?  ?Prior Functioning/Environment Prior Level of Function : Needs assist;Driving ?  ?  ?  ?Physical Assist : ADLs (physical) ?  ?  ?Mobility Comments: Ambulatory with rollator ?ADLs Comments: Pt reports ~1 month PTA he was able to dress himself independently,  needed supervision for safety with bathing from Normal staff. ILF also provided assist for IADL management including cooking, cleaning, etc. Pt endorses driving. ?  ? ?  ?  ?OT Problem List: Decreased strength;Decreased range of motion;Decreased activity tolerance;Impaired balance (sitting and/or standing);Decreased safety awareness;Decreased knowledge of use of DME or AE ?  ?   ?OT Treatment/Interventions: Self-care/ADL training;Therapeutic exercise;Energy conservation;DME and/or AE instruction;Therapeutic activities;Patient/family education;Balance training  ?  ?OT Goals(Current goals can be found in the care plan section) Acute Rehab OT Goals ?Patient Stated Goal: To go home ?OT Goal Formulation: With patient ?Time For Goal Achievement: 12/06/21 ?Potential to Achieve Goals: Good  ?OT Frequency: Min 2X/week ?  ? ?Co-evaluation   ?  ?  ?  ?  ? ?  ?AM-PAC OT "6 Clicks" Daily Activity     ?Outcome Measure Help from another person eating meals?: None ?Help from another person taking care of personal grooming?: A Little ?Help from another person toileting, which includes using toliet, bedpan, or urinal?: A Little ?Help  from another person bathing (including washing, rinsing, drying)?: A Lot ?Help from another person to put on and taking off regular upper body clothing?: A Little ?Help from another person to put on and taking off regular lower body clothing?: A Lot ?6 Click Score: 17 ?  ?End of Session Equipment Utilized During Treatment: Gait belt ?Nurse Communication: Mobility status ? ?Activity Tolerance: Patient tolerated treatment well ?Patient left: in chair;with call bell/phone within reach;with nursing/sitter in room ? ?OT Visit Diagnosis: Other abnormalities of gait and mobility (R26.89);Muscle weakness (generalized) (M62.81)  ?              ?Time: 7125-2712 ?OT Time Calculation (min): 26 min ?Charges:  OT Evaluation ?$OT Eval Moderate Complexity: 1 Mod ?OT Treatments ?$Self Care/Home Management : 8-22  mins ? ?Shara Blazing, M.S., OTR/L ?Feeding Team - [facility] ?Ascom: [phone] ?11/22/21, 1:38 PM ? ?

## 2021-11-22 NOTE — Progress Notes (Signed)
Inpatient Diabetes Program Recommendations ? ?AACE/ADA: New Consensus Statement on Inpatient Glycemic Control (2015) ? ?Target Ranges:  Prepandial:   less than 140 mg/dL ?     Peak postprandial:   less than 180 mg/dL (1-2 hours) ?     Critically ill patients:  140 - 180 mg/dL  ? ?Lab Results  ?Component Value Date  ? GLUCAP 197 (H) 11/22/2021  ? HGBA1C 7.2 (H) 10/25/2021  ? ? ?Review of Glycemic Control ? Latest Reference Range & Units 11/02/21 15:46 11/21/21 16:35 11/21/21 22:59 11/22/21 08:19 11/22/21 12:52  ?Glucose-Capillary 70 - 99 mg/dL 203 (H) 215 (H) 174 (H) 185 (H) 197 (H)  ?(H): Data is abnormally high ? ?Diabetes history: DM2 ?Outpatient Diabetes medications: Lantus 20 units QD, Novolog 0-9 units TID ?Current orders for Inpatient glycemic control: Novolog 0-9 units TID ? ?Inpatient Diabetes Program Recommendations:   ? ?Semglee 10 units QD (50% of home dose) ? ?Will continue to follow while inpatient. ? ?Thank you, ?Reche Dixon, MSN, RN ?Diabetes Coordinator ?Inpatient Diabetes Program ?[phone] (team pager from 8a-5p) ? ? ? ?

## 2021-11-22 NOTE — Progress Notes (Signed)
Pharmacy Antibiotic Note ? 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  Pharmacy has been consulted for Unasyn dosing. ? ?-PCT 0.67>>0.56 ? ?Plan: ?Patient received Cefepime,vanc, azith x 1 each on 3/6 in ED ? ?Will order Unasyn 3 gm IV q6h  ? ? ? ?Height: '5\' 8"'$  (172.7 cm) ?Weight: 88 kg (194 lb 0.1 oz) ?IBW/kg (Calculated) : 68.4 ? ?Temp (24hrs), Avg:98.8 ?F (37.1 ?C), Min:97.9 ?F (36.6 ?C), Max:99.6 ?F (37.6 ?C) ? ?Recent Labs  ?Lab 11/21/21 ?1049 11/21/21 ?1344  ?WBC 9.0  --   ?CREATININE 1.37*  --   ?LATICACIDVEN 1.6 1.1  ?  ?Estimated Creatinine Clearance: 40.2 mL/min (A) (by C-G formula based on SCr of 1.37 mg/dL (H)).   ? ?Allergies  ?Allergen Reactions  ? Dulaglutide   ?  Other reaction(s): upset stomach ?Other reaction(s): Abdominal Pain, upset stomach ?Other reaction(s): upset stomach ?  ? Empagliflozin Other (See Comments)  ?  Other reaction(s): yeast infection ?Other reaction(s): Other (See Comments) ?Other reaction(s): yeast infection ?  ? Lisinopril Cough  ?  tachycardia  ? Nexium [Esomeprazole] Other (See Comments)  ?  Headache  ? Oxycodone Other (See Comments)  ?  Even 1/2 tablet of '5mg'$  caused increased confusion - contributory to hospitalization 10/2020 with AMS  ? Pregabalin Other (See Comments) and Nausea Only  ?  Other reaction(s): MALAISE, SLIGHT NAUSEA, SLIGHT HEADACHE ?Other reaction(s): MALAISE, SLIGHT NAUSEA, SLIGHT HEADACHE ?  ? Rosuvastatin Other (See Comments)  ?  Other reaction(s): muscle pain ?2.16.2022 Pt is current on this medication without any issues. ?Other reaction(s): muscle pain ?2.16.2022 Pt is current on this medication without any issues. ?Other reaction(s): muscle pain  ? ? ?Antimicrobials this admission: ?Vancomycin, Cefepime, Azithromycin x 1 dose each 3/6 in ED ?Unasyn 3/7 >>   ? ?Dose adjustments this admission: ?  ? ?Microbiology results: ?3/6 BCx: NG<24 hrs ?  UCx:    ?  Sputum:    ?  MRSA PCR:   ? ?Thank you for allowing pharmacy  to be a part of this patient?s care. ? ?Jacai Kipp A ?11/22/2021 9:38 AM ? ?

## 2021-11-22 NOTE — Progress Notes (Signed)
PROGRESS NOTE  James Spurr, James Bell GYI:[id number] DOB: February 27, 1931 DOA: 11/21/2021 PCP: [name], James Bell  HPI/Recap of past 24 hours: James Spurr, James Bell is a 88 y.o. male with medical history significant of CVA with right-sided residual weakness, diabetes mellitus type 2, CKD stage III, obstructive sleep apnea on CPAP, GERD, migraine who presents from independent living facility to [facility] ED with complaints of nausea, vomiting and intermittent productive cough.  Work-up revealed concern for aspiration pneumonia and physical debility.  Started on Unasyn.  Nausea and vomiting improving, tolerating a clear liquid diet.  Due to concern for dysphagia with aspiration speech therapist was consulted for formal swallow evaluation.  11/22/2021: Patient was seen and examined at his bedside.  Persistent productive cough, noted on exam.  States it hurts his abdomen when he coughs.  Assessment/Plan: Principal Problem:   Nausea and vomiting Intractable nausea and vomiting, unclear etiology - CT scan of the abdomen pelvis negative for any acute pathology. - Patient initiated on clear liquid diet, gentle IV fluids As needed Zofran. - Continue to monitor and advance diet as tolerated. - Abdominal physical exam also is nonfocal. - Patient has any recurrent nausea vomiting or diarrhea the should be tested for Noravirus as an outbreak going on another state.  His nausea and vomiting are improving.  Aspiration pneumonia, POA Personally reviewed CT scan done on admission showing bilateral lower lobes infiltrates.  Due to concern for developing aspiration pneumonia, started on Unasyn on 11/22/2021.   Aspiration precautions in place. Speech therapist consulted.  Severe generalized weakness/fatigue: - This could be secondary to underlying dehydration - Continue gentle IV fluid hydration, replete electrolytes as indicated. PT OT assessment recommending SNF. TOC assisting with SNF placement. Continue fall  precautions.  Type 2 diabetes with hyperglycemia Hemoglobin A1c 7.2 on 10/25/2021. Cover with insulin sliding scale. Avoid hypoglycemia.  CKD 2 Appears to be close to his baseline creatinine Continue gentle IV fluid hydration Continue to avoid nephrotoxic agents, dehydration and hypotension. Closely monitor urine output Repeat renal panel in the morning.    DVT prophylaxis:      Lovenox subcu daily. Code Status:              Full Code    Disposition Plan: Likely will DC to SNF.  Patient is from [place].                Status is: Inpatient Patient requires at least 2 midnights for further evaluation and treatment of present condition.    Objective: Vitals:   11/22/21 0711 11/22/21 0800 11/22/21 1100 11/22/21 1250  BP: 129/63 122/65 126/70 (!) 146/69  Pulse: 80 77 84 89  Resp: '16 13 18 18  '$ Temp:  98 F (36.7 C) 98.4 F (36.9 C) 98.4 F (36.9 C)  TempSrc:  Oral Oral Oral  SpO2: 95% 94% 94% 92%  Weight:      Height:        Intake/Output Summary (Last 24 hours) at 11/22/2021 1408 Last data filed at 11/22/2021 0111 Gross per 24 hour  Intake 182.04 ml  Output 250 ml  Net -67.96 ml   Filed Weights   11/21/21 1042  Weight: 88 kg    Exam:  General: 88 y.o. year-old male well developed well nourished in no acute distress.  Alert and oriented x3. Cardiovascular: Regular rate and rhythm with no rubs or gallops.  No thyromegaly or JVD noted.   Respiratory: Mild rales at bases.  No wheezing noted.  Poor inspiratory effort.  Abdomen: Soft nontender nondistended with normal bowel sounds x4 quadrants. Musculoskeletal: Trace lower extremity edema bilaterally. Skin: No ulcerative lesions noted or rashes, Psychiatry: Mood is appropriate for condition and setting   Data Reviewed: CBC: Recent Labs  Lab 11/21/21 1049  WBC 9.0  NEUTROABS 7.0  HGB 14.0  HCT 42.2  MCV 88.3  PLT 701   Basic Metabolic Panel: Recent Labs  Lab 11/21/21 1049 11/21/21 2330  NA 134*  --   K  4.0  --   CL 104  --   CO2 22  --   GLUCOSE 218*  --   BUN 18  --   CREATININE 1.37*  --   CALCIUM 8.4*  --   MG  --  1.6*  PHOS  --  3.1   GFR: Estimated Creatinine Clearance: 40.2 mL/min (A) (by C-G formula based on SCr of 1.37 mg/dL (H)). Liver Function Tests: Recent Labs  Lab 11/21/21 1049  AST 20  ALT 10  ALKPHOS 49  BILITOT 1.2  PROT 7.0  ALBUMIN 3.3*   No results for input(s): LIPASE, AMYLASE in the last 168 hours. No results for input(s): AMMONIA in the last 168 hours. Coagulation Profile: Recent Labs  Lab 11/21/21 1049  INR 1.1   Cardiac Enzymes: No results for input(s): CKTOTAL, CKMB, CKMBINDEX, TROPONINI in the last 168 hours. BNP (last 3 results) No results for input(s): PROBNP in the last 8760 hours. HbA1C: No results for input(s): HGBA1C in the last 72 hours. CBG: Recent Labs  Lab 11/21/21 1635 11/21/21 2259 11/22/21 0819 11/22/21 1252  GLUCAP 215* 174* 185* 197*   Lipid Profile: No results for input(s): CHOL, HDL, LDLCALC, TRIG, CHOLHDL, LDLDIRECT in the last 72 hours. Thyroid Function Tests: No results for input(s): TSH, T4TOTAL, FREET4, T3FREE, THYROIDAB in the last 72 hours. Anemia Panel: Recent Labs    11/21/21 2330  VITAMINB12 368  FOLATE 15.2   Urine analysis:    Component Value Date/Time   COLORURINE YELLOW (A) 11/21/2021 1344   APPEARANCEUR CLEAR (A) 11/21/2021 1344   LABSPEC 1.017 11/21/2021 1344   PHURINE 5.0 11/21/2021 1344   GLUCOSEU 50 (A) 11/21/2021 1344   GLUCOSEU >=1000 (A) 10/17/2021 1509   HGBUR NEGATIVE 11/21/2021 1344   BILIRUBINUR NEGATIVE 11/21/2021 1344   BILIRUBINUR negative 10/18/2020 0902   KETONESUR NEGATIVE 11/21/2021 1344   PROTEINUR 30 (A) 11/21/2021 1344   UROBILINOGEN 0.2 10/17/2021 1509   NITRITE NEGATIVE 11/21/2021 1344   LEUKOCYTESUR NEGATIVE 11/21/2021 1344   Sepsis Labs: '@LABRCNTIP'$ (procalcitonin:4,lacticidven:4)  ) Recent Results (from the past 240 hour(s))  Blood Culture (routine x 2)      Status: None (Preliminary result)   Collection Time: 11/21/21 10:49 AM   Specimen: BLOOD  Result Value Ref Range Status   Specimen Description BLOOD RIGHT HAND  Final   Special Requests   Final    BOTTLES DRAWN AEROBIC AND ANAEROBIC Blood Culture adequate volume   Culture   Final    NO GROWTH < 24 HOURS Performed at [facility], [address] [place], [place] [address]    Report Status PENDING  Incomplete  Blood Culture (routine x 2)     Status: None (Preliminary result)   Collection Time: 11/21/21 10:49 AM   Specimen: BLOOD  Result Value Ref Range Status   Specimen Description BLOOD RIGHT ARM  Final   Special Requests   Final    BOTTLES DRAWN AEROBIC AND ANAEROBIC Blood Culture adequate volume   Culture   Final  NO GROWTH < 24 HOURS Performed at [facility], [address] [place], [place] [address]    Report Status PENDING  Incomplete  Resp Panel by RT-PCR (Flu A&B, Covid) Nasopharyngeal Swab     Status: None   Collection Time: 11/21/21 10:49 AM   Specimen: Nasopharyngeal Swab; Nasopharyngeal(NP) swabs in vial transport medium  Result Value Ref Range Status   SARS Coronavirus 2 by RT PCR NEGATIVE NEGATIVE Final    Comment: (NOTE) SARS-CoV-2 target nucleic acids are NOT DETECTED.  The SARS-CoV-2 RNA is generally detectable in upper respiratory specimens during the acute phase of infection. The lowest concentration of SARS-CoV-2 viral copies this assay can detect is 138 copies/mL. A negative result does not preclude SARS-Cov-2 infection and should not be used as the sole basis for treatment or other patient management decisions. A negative result may occur with  improper specimen collection/handling, submission of specimen other than nasopharyngeal swab, presence of viral mutation(s) within the areas targeted by this assay, and inadequate number of viral copies(<138 copies/mL). A negative result must be combined with clinical observations,  patient history, and epidemiological information. The expected result is Negative.  Fact Sheet for Patients:  EntrepreneurPulse.com.au  Fact Sheet for Healthcare Providers:  IncredibleEmployment.be  This test is no t yet approved or cleared by the [place] FDA and  has been authorized for detection and/or diagnosis of SARS-CoV-2 by FDA under an Emergency Use Authorization (EUA). This EUA will remain  in effect (meaning this test can be used) for the duration of the COVID-19 declaration under Section 564(b)(1) of the Act, 21 U.S.C.section 360bbb-3(b)(1), unless the authorization is terminated  or revoked sooner.       Influenza A by PCR NEGATIVE NEGATIVE Final   Influenza B by PCR NEGATIVE NEGATIVE Final    Comment: (NOTE) The Xpert Xpress SARS-CoV-2/FLU/RSV plus assay is intended as an aid in the diagnosis of influenza from Nasopharyngeal swab specimens and should not be used as a sole basis for treatment. Nasal washings and aspirates are unacceptable for Xpert Xpress SARS-CoV-2/FLU/RSV testing.  Fact Sheet for Patients: EntrepreneurPulse.com.au  Fact Sheet for Healthcare Providers: IncredibleEmployment.be  This test is not yet approved or cleared by the [place] FDA and has been authorized for detection and/or diagnosis of SARS-CoV-2 by FDA under an Emergency Use Authorization (EUA). This EUA will remain in effect (meaning this test can be used) for the duration of the COVID-19 declaration under Section 564(b)(1) of the Act, 21 U.S.C. section 360bbb-3(b)(1), unless the authorization is terminated or revoked.  Performed at [facility], [address] [place], [place] [address]       Studies: CT HEAD WO CONTRAST (5MM)  Result Date: 11/21/2021 CLINICAL DATA:  Mental status change, unknown cause. EXAM: CT HEAD WITHOUT CONTRAST TECHNIQUE: Contiguous axial images were obtained from  the base of the skull through the vertex without intravenous contrast. RADIATION DOSE REDUCTION: This exam was performed according to the departmental dose-optimization program which includes automated exposure control, adjustment of the mA and/or kV according to patient size and/or use of iterative reconstruction technique. COMPARISON:  Prior head CT examinations 10/25/2021 and earlier. Brain MRI 08/26/2021. FINDINGS: There is residual circulating contrast from the abdominopelvic CT performed earlier today. Brain: Mild generalized cerebral atrophy. Redemonstrated chronic infarct within the left corona radiata/internal capsule and lentiform nucleus. Background mild patchy and ill-defined hypoattenuation within the cerebral white matter, nonspecific but compatible chronic small vessel ischemic disease. There is no acute intracranial hemorrhage. No demarcated cortical  infarct. No extra-axial fluid collection. No evidence of an intracranial mass. No midline shift. Vascular: Residual circulating contrast limits evaluation for hyperdense vessels. Atherosclerotic calcification. Skull: Normal. Negative for fracture or focal lesion. Sinuses/Orbits: Visualized orbits show no acute finding. Trace mucosal thickening within the bilateral frontoethmoidal recesses and anterior ethmoid air cells. Other: Redemonstrated subcentimeter retained metallic foreign body within the frontal calvarium/scalp. IMPRESSION: No evidence of acute intracranial abnormality. Redemonstrated chronic infarct within the left corona radiata/internal capsule and left basal ganglia. Background mild chronic small vessel ischemic changes within the cerebral white matter. Mild generalized cerebral atrophy. Electronically Signed   By: Kellie Simmering D.O.   On: 11/21/2021 14:26   MR BRAIN WO CONTRAST  Result Date: 11/21/2021 CLINICAL DATA:  Stroke, follow up EXAM: MRI HEAD WITHOUT CONTRAST TECHNIQUE: Multiplanar, multiecho pulse sequences of the brain and  surrounding structures were obtained without intravenous contrast. COMPARISON:  08/26/2021 FINDINGS: Brain: There is no acute infarction or intracranial hemorrhage. There is no intracranial mass, mass effect, or edema. There is no hydrocephalus or extra-axial fluid collection. Prominence of the ventricles and sulci reflects similar parenchymal volume loss. Patchy and confluent areas of T2 hyperintensity in the supratentorial white matter are nonspecific probably reflect similar chronic microvascular ischemic changes. There is a chronic small vessel infarct of the left corona radiata and basal ganglia. Vascular: Major vessel flow voids at the skull base are preserved. Skull and upper cervical spine: Normal marrow signal is preserved. Sinuses/Orbits: Paranasal sinuses are aerated. Bilateral lens replacements. Other: Sella is unremarkable.  Mastoid air cells are clear. IMPRESSION: No acute infarct, hemorrhage, or mass. No substantial change in chronic finding since prior study. Electronically Signed   By: Macy Mis M.D.   On: 11/21/2021 15:46   MR FOOT LEFT WO CONTRAST  Result Date: 11/21/2021 CLINICAL DATA:  Foot swelling, nondiabetic, osteomyelitis suspected. EXAM: MRI OF THE LEFT FOOT WITHOUT CONTRAST TECHNIQUE: Multiplanar, multisequence MR imaging of the left was performed. No intravenous contrast was administered. COMPARISON:  Radiograph performed earlier on the same date FINDINGS: Multiple sequences are degraded due to motion. Bones/Joint/Cartilage No fracture or dislocation. Normal alignment. No joint effusion. No marrow signal abnormality. Area of concern in the proximal phalanx of the second digit was not included on this examination. Ligaments Evaluation of collateral ligaments is limited due to motion. Lisfranc ligament is intact. Muscles and Tendons Flexor, peroneal and extensor compartment tendons are intact. Muscles are normal. Soft tissue No fluid collection or hematoma. No soft tissue mass.  Subcutaneous soft tissue edema about the dorsum of the foot. IMPRESSION: 1. Multiple sequences are limited due to motion. Within the limitation there is no MR evidence of acute osteomyelitis. Area of concern in the proximal phalanx of the second digit however was not included on this examination. If there is persistent clinical concern MR of the forefoot could be obtained for further evaluation. 2. Number soft tissue edema about the dorsum of the foot and about the ankle. Electronically Signed   By: Keane Police D.O.   On: 11/21/2021 16:16    Scheduled Meds:  clopidogrel  75 mg Oral Daily   DULoxetine  60 mg Oral Daily   gabapentin  600 mg Oral TID   guaiFENesin  1,200 mg Oral BID   insulin aspart  0-9 Units Subcutaneous TID WC   ipratropium-albuterol  3 mL Nebulization Q6H   levothyroxine  50 mcg Oral Q0600   melatonin  5 mg Oral QHS   pramipexole  1 mg Oral Daily  sodium chloride HYPERTONIC  4 mL Nebulization BID   tamsulosin  0.4 mg Oral QPC breakfast    Continuous Infusions:  ampicillin-sulbactam (UNASYN) IV     lactated ringers 75 mL/hr at 11/22/21 0059   magnesium sulfate bolus IVPB 2 g (11/22/21 1319)     LOS: 1 day     Kayleen Memos, James Bell [facility] Pager [phone]  If 7PM-7AM, please contact night-coverage [email or web address] Password [facility] 11/22/2021, 2:08 PM

## 2021-11-22 NOTE — Evaluation (Signed)
Physical Therapy Evaluation Patient Details Name: James KURKA, MD MRN: [id number] DOB: 04-29-1931 Today's Date: 11/22/2021  History of Present Illness  Patient is an 88 year old male with a PMH (+) for CKD, CVA, GERD, recent admission for cellulitis wound infection presents to the ER for nausea vomiting and generalized malaise.   Clinical Impression  Physical Therapy Evaluation completed this date. Patient tolerated session well and was agreeable to treatment. Pain reported in patient's throat (7/10), was A&Ox3. Patient came from [facility], and lives alone. Per patient, less than a week ago he had an HHPT evaluation. Per documentation, this PT evaluation deemed patient a high fall risk. Patient states that his daughter is looking into getting patient a home aide to come in the morning and night.   Patient demonstrated generalized weakness bilaterally (R>L- due to previous stroke). RUE and RLE at least 2+to3/5 strength, and LUE and LLE at least 3+/5 strength. Patient required Mod A to complete supine to sitting bed transfer. Attempted through HHA, however patient was unable to complete. Assistance was given at trunk and BLEs. Once in sitting patient demonstrated poor sitting balance, with increased use of BUEs for stability and a posterior lean. Initially required Min A, however was able to progress to supervision. Min-Mod A was required to sit to stand transfer from EOB and RW, as well as increased time to come to full standing. Patient initially required only CGA with RW during ambulation within the home, however with increased fatigue with RUE and RLE, Mod A +2 (RN) was required to get patient back to bed. With fatigue increased knee flexion and trunk flexion was noted. Patient required Min A to complete sit to supine transfer back into bed, and Max A +2 to scoot patient back up in bed via chuck pad. Patient would continue to benefit from skilled physical therapy in order to optimize  patient's return to PLOF. Recommend STR for now, however if patient progresses would consider HHPT with increased home aide supervision.      Recommendations for follow up therapy are one component of a multi-disciplinary discharge planning process, led by the attending physician.  Recommendations may be updated based on patient status, additional functional criteria and insurance authorization.  Follow Up Recommendations Skilled nursing-short term rehab (<3 hours/day)    Assistance Recommended at Discharge Frequent or constant Supervision/Assistance  Patient can return home with the following  A little help with walking and/or transfers;A little help with bathing/dressing/bathroom;Assistance with cooking/housework;Assistance with feeding;Direct supervision/assist for medications management;Direct supervision/assist for financial management;Assist for transportation;Help with stairs or ramp for entrance    Equipment Recommendations Other (comment) (defer to next level of care)  Recommendations for Other Services  OT consult    Functional Status Assessment Patient has had a recent decline in their functional status and demonstrates the ability to make significant improvements in function in a reasonable and predictable amount of time.     Precautions / Restrictions Precautions Precautions: Fall Restrictions Weight Bearing Restrictions: No      Mobility  Bed Mobility Overal bed mobility: Needs Assistance Bed Mobility: Supine to Sit, Sit to Supine     Supine to sit: Mod assist (increased assistance at BLEs and trunk, attempted Min A through HHA however patient was unable to complete) Sit to supine: Min assist     Patient Response: Flat affect  Transfers Overall transfer level: Needs assistance Equipment used: Rolling walker (2 wheels) Transfers: Sit to/from Stand Sit to Stand: Min assist, Mod assist  Ambulation/Gait Ambulation/Gait assistance: Min  guard, Min assist, Mod assist Gait Distance (Feet): 15 Feet Assistive device: Rolling walker (2 wheels) Gait Pattern/deviations: Step-to pattern, Trunk flexed Gait velocity: decreased     General Gait Details: patient progressively required increased assistance when ambulating into room, increased knee flexion and trunk forward flexion when RUE and RLE fatigued, RN came into assist (Mod A+2) back to bed, mild-moderate unsteadiness when ambulating  Stairs            Wheelchair Mobility    Modified Rankin (Stroke Patients Only)       Balance Overall balance assessment: Needs assistance Sitting-balance support: Bilateral upper extremity supported, Feet unsupported Sitting balance-Leahy Scale: Poor Sitting balance - Comments: cueing for upright posture, however patient unable to maintain for long periods of time, occasional Min A to maintain upright posture Postural control: Posterior lean Standing balance support: Bilateral upper extremity supported, During functional activity, Reliant on assistive device for balance Standing balance-Leahy Scale: Poor Standing balance comment: Increased assistance required in standing when patient fatigues                             Pertinent Vitals/Pain Pain Assessment Pain Score: 7  Faces Pain Scale: Hurts a little bit Pain Location: Throat Pain Descriptors / Indicators: Sore Pain Intervention(s): Limited activity within patient's tolerance, Monitored during session    Home Living Family/patient expects to be discharged to:: Assisted living                 Home Equipment: Rollator (4 wheels);Rolling Walker (2 wheels);Other (comment) (Hemi Walker) Additional Comments: [employer]    Prior Function Prior Level of Function : Needs assist             Mobility Comments: Ambulatory with rollator       Hand Dominance   Dominant Hand: Left    Extremity/Trunk Assessment   Upper Extremity Assessment Upper  Extremity Assessment: Generalized weakness RUE Deficits / Details: at least 3/5 strength on RUE LUE Deficits / Details: at least 3+/5 strength LUE    Lower Extremity Assessment Lower Extremity Assessment: Generalized weakness RLE Deficits / Details: 2+/5 R DF; at least 3/5 strength with hip flexion, knee flexion, and knee extension       Communication   Communication: HOH  Cognition Arousal/Alertness: Awake/alert Behavior During Therapy: WFL for tasks assessed/performed Overall Cognitive Status: Within Functional Limits for tasks assessed                                 General Comments: Pt is A and O x 4. has flat affect but cooperative. slow moving throughout        General Comments      Exercises Other Exercises Other Exercises: patient educated on role of PT in acute setting, fall risk, d/c plans   Assessment/Plan    PT Assessment Patient needs continued PT services  PT Problem List Decreased strength;Decreased activity tolerance;Decreased balance;Decreased mobility;Decreased knowledge of use of DME;Decreased knowledge of precautions;Pain;Decreased skin integrity       PT Treatment Interventions DME instruction;Gait training;Functional mobility training;Therapeutic activities;Therapeutic exercise;Balance training;Patient/family education    PT Goals (Current goals can be found in the Care Plan section)  Acute Rehab PT Goals Patient Stated Goal: to go home PT Goal Formulation: With patient Time For Goal Achievement: 12/06/21 Potential to Achieve Goals: Fair    Frequency Min  2X/week     Co-evaluation               AM-PAC PT "6 Clicks" Mobility  Outcome Measure Help needed turning from your back to your side while in a flat bed without using bedrails?: A Lot Help needed moving from lying on your back to sitting on the side of a flat bed without using bedrails?: A Lot Help needed moving to and from a bed to a chair (including a wheelchair)?:  A Little Help needed standing up from a chair using your arms (e.g., wheelchair or bedside chair)?: A Little Help needed to walk in hospital room?: A Lot Help needed climbing 3-5 steps with a railing? : A Lot 6 Click Score: 14    End of Session Equipment Utilized During Treatment: Gait belt Activity Tolerance: Patient tolerated treatment well Patient left: in bed;with nursing/sitter in room Nurse Communication: Mobility status PT Visit Diagnosis: Other abnormalities of gait and mobility (R26.89);Muscle weakness (generalized) (M62.81);Difficulty in walking, not elsewhere classified (R26.2)    Time: 4081-4481 PT Time Calculation (min) (ACUTE ONLY): 26 min   Charges:   PT Evaluation $PT Eval Low Complexity: 1 Low PT Treatments $Gait Training: 8-22 mins        Iva Boop, PT  11/22/21. 10:18 AM

## 2021-11-22 NOTE — TOC Initial Note (Signed)
Transition of Care (TOC) - Initial/Assessment Note  ? ? ?Patient Details  ?Name: James Spurr, James Bell ?MRN: [id number] ?Date of Birth: 11/27/1930 ? ?Transition of Care (TOC) CM/SW Contact:    ?Pete Pelt, RN ?Phone Number: ?11/22/2021, 4:23 PM ? ?Clinical Narrative:         Patient transferred from [facility] for nausea and vomiting  Patient was sent to [facility] in February  Message left with Debbra Riding at facility.  TOC to follow.        ? ? ?Expected Discharge Plan: [place] ?Barriers to Discharge: Continued Medical Work up ? ? ?Patient Goals and CMS Choice ?  ?  ?Choice offered to / list presented to : NA ? ?Expected Discharge Plan and Services ?Expected Discharge Plan: [place] ?  ?Discharge Planning Services: CM Consult ?  ?Living arrangements for the past 2 months: [place] ?                ?  ?  ?  ?  ?  ?  ?  ?  ?  ?  ? ?Prior Living Arrangements/Services ?Living arrangements for the past 2 months: [place] ?Lives with:: Facility Resident ?Patient language and need for interpreter reviewed:: Yes ?Do you feel safe going back to the place where you live?: Yes      ?Need for Family Participation in Patient Care: Yes (Comment) ?Care giver support system in place?: Yes (comment) ?  ?Criminal Activity/Legal Involvement Pertinent to Current Situation/Hospitalization: No - Comment as needed ? ?Activities of Daily Living ?Home Assistive Devices/Equipment: Gilford Rile (specify type), Wheelchair ?ADL Screening (condition at time of admission) ?Patient's cognitive ability adequate to safely complete daily activities?: Yes ?Is the patient deaf or have difficulty hearing?: No ?Does the patient have difficulty seeing, even when wearing glasses/contacts?: No ?Does the patient have difficulty concentrating, remembering, or making decisions?: No ?Patient able to express need for assistance with ADLs?: Yes ?Does the patient have difficulty dressing or  bathing?: No ?Independently performs ADLs?: Yes (appropriate for developmental age) ?Does the patient have difficulty walking or climbing stairs?: Yes ?Weakness of Legs: Right ?Weakness of Arms/Hands: None ? ?Permission Sought/Granted ?Permission sought to share information with : Case Manager ?Permission granted to share information with : Yes, Verbal Permission Granted ?   ? Permission granted to share info w AGENCY: [facility] ?   ?   ? ?Emotional Assessment ?Appearance:: Appears stated age ?Attitude/Demeanor/Rapport: Gracious ?Affect (typically observed): Pleasant ?Orientation: : Oriented to Self, Oriented to Place, Oriented to  Time, Oriented to Situation ?Alcohol / Substance Use: Not Applicable ?Psych Involvement: No (comment) ? ?Admission diagnosis:  Uncontrollable vomiting [R11.10] ?Nausea and vomiting [R11.2] ?Altered mental status, unspecified altered mental status type [R41.82] ?Patient Active Problem List  ? Diagnosis Date Noted  ? Nausea and vomiting 11/21/2021  ? C. difficile colitis   ? Diarrhea 10/26/2021  ? Postural dizziness with presyncope 10/25/2021  ? Abrasion of heel, infected, left, initial encounter 10/25/2021  ? Cellulitis of left foot 10/25/2021  ? General weakness 05/12/2021  ? Right medial knee pain 05/12/2021  ? Frequent UTI 03/31/2021  ? Polypharmacy 03/10/2021  ? Acute encephalopathy 11/02/2020  ? Healthcare maintenance 06/01/2020  ? Involuntary movements 04/25/2020  ? Post-nasal drainage 07/16/2019  ? Recurrent productive cough 05/31/2019  ? Hx of basal cell carcinoma 07/10/2017  ? Rosacea   ? Gait disturbance, post-stroke 10/05/2015  ? Presbycusis of both ears   ?  Advanced care planning/counseling discussion 06/23/2015  ? Impaired mobility 04/23/2015  ? Hearing loss of right ear due to cerumen impaction 03/23/2015  ? HTN (hypertension) 03/18/2015  ? Diastolic dysfunction 16/06/9603  ? Spastic hemiparesis affecting dominant side ([place]) 07/07/2014  ? Chest pressure 02/03/2014  ?  Medicare annual wellness visit, subsequent 11/06/2013  ? Erectile dysfunction due to arterial insufficiency 02/05/2013  ? MDD (major depressive disorder), recurrent episode, moderate ([place])   ? Palpitations 06/26/2012  ? Hyperlipidemia associated with type 2 diabetes mellitus ([place])   ? Restless leg syndrome, severe   ? GERD (gastroesophageal reflux disease)   ? BPH with obstruction/lower urinary tract symptoms   ? History of CVA (cerebrovascular accident) 03/11/2012  ? Hemiparesis affecting right side as late effect of cerebrovascular accident (CVA) ([place]) 03/11/2012  ? Uncontrolled type 2 diabetes mellitus with hyperglycemia, with long-term current use of insulin ([place]) 03/11/2012  ? OSA on CPAP 03/11/2012  ? Stage 3a chronic kidney disease ([place]) 03/11/2012  ? Migraines 03/11/2012  ? Thrombocytopenia ([place]) 2011  ? Cause of injury, fall 2011  ? ?PCP:  [name], James Bell ?Pharmacy:   ?[facility], [place] ?[address] ?[place] [address] ?Phone: [phone] Fax: [phone] ? ?[facility] - [place], [name] ?[address] ?[name] [address] ?Phone: [phone] Fax: [phone] ? ?[id number] - [address], [place] ?[address] ?[place] [address] ?Phone: [phone] Fax: [phone] ? ? ? ? ?Social Determinants of Health (SDOH) Interventions ?  ? ?Readmission Risk Interventions ?No flowsheet data found. ? ? ?

## 2021-11-22 NOTE — ED Notes (Signed)
Pt sleeping at this time. NAD Noted, respirations even and unlabored ?

## 2021-11-23 ENCOUNTER — Encounter: Payer: Self-pay | Admitting: Internal Medicine

## 2021-11-23 DIAGNOSIS — R0989 Other specified symptoms and signs involving the circulatory and respiratory systems: Secondary | ICD-10-CM

## 2021-11-23 DIAGNOSIS — E1165 Type 2 diabetes mellitus with hyperglycemia: Secondary | ICD-10-CM

## 2021-11-23 DIAGNOSIS — J690 Pneumonitis due to inhalation of food and vomit: Principal | ICD-10-CM

## 2021-11-23 DIAGNOSIS — R531 Weakness: Secondary | ICD-10-CM

## 2021-11-23 LAB — COMPREHENSIVE METABOLIC PANEL
ALT: 10 U/L (ref 0–44)
AST: 13 U/L — ABNORMAL LOW (ref 15–41)
Albumin: 2.6 g/dL — ABNORMAL LOW (ref 3.5–5.0)
Alkaline Phosphatase: 43 U/L (ref 38–126)
Anion gap: 7 (ref 5–15)
BUN: 14 mg/dL (ref 8–23)
CO2: 25 mmol/L (ref 22–32)
Calcium: 8.5 mg/dL — ABNORMAL LOW (ref 8.9–10.3)
Chloride: 105 mmol/L (ref 98–111)
Creatinine, Ser: 1.36 mg/dL — ABNORMAL HIGH (ref 0.61–1.24)
GFR, Estimated: 50 mL/min — ABNORMAL LOW (ref >60)
Glucose, Bld: 219 mg/dL — ABNORMAL HIGH (ref 70–99)
Potassium: 4.1 mmol/L (ref 3.5–5.1)
Sodium: 137 mmol/L (ref 135–145)
Total Bilirubin: 0.7 mg/dL (ref 0.3–1.2)
Total Protein: 6.1 g/dL — ABNORMAL LOW (ref 6.5–8.1)

## 2021-11-23 LAB — CBC WITH DIFFERENTIAL/PLATELET
Abs Immature Granulocytes: 0.04 10*3/uL (ref 0.00–0.07)
Basophils Absolute: 0 10*3/uL (ref 0.0–0.1)
Basophils Relative: 0 %
Eosinophils Absolute: 0.2 10*3/uL (ref 0.0–0.5)
Eosinophils Relative: 3 %
HCT: 36.8 % — ABNORMAL LOW (ref 39.0–52.0)
Hemoglobin: 12.1 g/dL — ABNORMAL LOW (ref 13.0–17.0)
Immature Granulocytes: 1 %
Lymphocytes Relative: 27 %
Lymphs Abs: 1.9 10*3/uL (ref 0.7–4.0)
MCH: 29.1 pg (ref 26.0–34.0)
MCHC: 32.9 g/dL (ref 30.0–36.0)
MCV: 88.5 fL (ref 80.0–100.0)
Monocytes Absolute: 0.7 10*3/uL (ref 0.1–1.0)
Monocytes Relative: 10 %
Neutro Abs: 4.3 10*3/uL (ref 1.7–7.7)
Neutrophils Relative %: 59 %
Platelets: 154 10*3/uL (ref 150–400)
RBC: 4.16 MIL/uL — ABNORMAL LOW (ref 4.22–5.81)
RDW: 13.7 % (ref 11.5–15.5)
WBC: 7.2 10*3/uL (ref 4.0–10.5)
nRBC: 0 % (ref 0.0–0.2)

## 2021-11-23 LAB — GLUCOSE, CAPILLARY
Glucose-Capillary: 203 mg/dL — ABNORMAL HIGH (ref 70–99)
Glucose-Capillary: 229 mg/dL — ABNORMAL HIGH (ref 70–99)
Glucose-Capillary: 211 mg/dL — ABNORMAL HIGH (ref 70–99)
Glucose-Capillary: 213 mg/dL — ABNORMAL HIGH (ref 70–99)
Glucose-Capillary: 175 mg/dL — ABNORMAL HIGH (ref 70–99)

## 2021-11-23 LAB — PROCALCITONIN: Procalcitonin: 0.2 ng/mL

## 2021-11-23 LAB — MAGNESIUM: Magnesium: 2 mg/dL (ref 1.7–2.4)

## 2021-11-23 LAB — PHOSPHORUS: Phosphorus: 3.5 mg/dL (ref 2.5–4.6)

## 2021-11-23 MED ORDER — IPRATROPIUM-ALBUTEROL 0.5-2.5 (3) MG/3ML IN SOLN
3.0000 mL | Freq: Two times a day (BID) | RESPIRATORY_TRACT | Status: DC
  Administered 2021-11-23 – 2021-11-24 (×3): 3 mL via RESPIRATORY_TRACT
  Filled 2021-11-23 (×3): qty 3

## 2021-11-23 MED ORDER — ORAL CARE MOUTH RINSE
15.0000 mL | Freq: Two times a day (BID) | OROMUCOSAL | Status: DC
  Administered 2021-11-23 – 2021-11-24 (×3): 15 mL via OROMUCOSAL

## 2021-11-23 MED ORDER — CHLORHEXIDINE GLUCONATE 0.12 % MT SOLN
15.0000 mL | Freq: Two times a day (BID) | OROMUCOSAL | Status: DC
  Administered 2021-11-23 (×2): 15 mL via OROMUCOSAL
  Filled 2021-11-23 (×3): qty 15

## 2021-11-23 NOTE — Assessment & Plan Note (Addendum)
Fatigue ?Physical deconditioning ?Suspect due to choking episode, aspiration pneumonia, dehydration and electrolyte abnormalities. ?Improving. ?SNF upon discharge for rehab.  It appears that patient's insurance had initially denied his SNF admission.  I did a peer to peer review with Dr. Filbert Schilder, Psychologist, occupational and was able to overturn the denial. ?

## 2021-11-23 NOTE — Assessment & Plan Note (Signed)
Patient clearly indicates that this was not intractable. ?Resolved.  Tolerating diet. ?CT A/P negative for acute pathology. ?

## 2021-11-23 NOTE — Assessment & Plan Note (Addendum)
Creatinine has fluctuated somewhat recently but this appears to be his baseline.  Creatinine down to 1.26 on day of discharge. ?

## 2021-11-23 NOTE — Assessment & Plan Note (Addendum)
Suspected during the choking episode. ?CT A/P showed basilar atelectasis but previous treating MD clinically felt the patient had aspiration pneumonia. ?Improved on IV Unasyn.  Completed about 3 days course.  Communicated with pharmacy and transitioned to Augmentin to complete total 5 days course. ?Aggressive pulmonary toilet. ?May consider repeat imaging i.e. chest x-ray or CT chest without contrast in 4 weeks to ensure resolution of abnormal findings. ?

## 2021-11-23 NOTE — Assessment & Plan Note (Addendum)
Patient denies prior history of dysphagia. ?Reported choking on a pill when he was taking multiple pills which was then followed by nausea and vomiting. ?SLP evaluated and recommend regular consistency diet and thin liquids. ?No recurrence of episodes. ?

## 2021-11-23 NOTE — Hospital Course (Addendum)
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  Admitted for suspected aspiration pneumonia/pneumonitis and physical debility.  Initiated on Unasyn.  SLP consulted, recommended regular consistency diet and thin liquids.  Clinically improved and discharging to SNF for rehab.  I did a peer to peer review with Psychologist, occupational. ?

## 2021-11-23 NOTE — Assessment & Plan Note (Signed)
Replaced. °

## 2021-11-23 NOTE — Assessment & Plan Note (Addendum)
Hemoglobin A1c 7.2 on 10/25/2021. ?  Resume prior to admission meds at discharge.  Close monitoring of CBGs at SNF. ?

## 2021-11-23 NOTE — Progress Notes (Signed)
Inpatient Diabetes Program Recommendations ? ?AACE/ADA: New Consensus Statement on Inpatient Glycemic Control (2015) ? ?Target Ranges:  Prepandial:   less than 140 mg/dL ?     Peak postprandial:   less than 180 mg/dL (1-2 hours) ?     Critically ill patients:  140 - 180 mg/dL  ? ?Lab Results  ?Component Value Date  ? GLUCAP 203 (H) 11/23/2021  ? HGBA1C 7.2 (H) 10/25/2021  ? ? ?Review of Glycemic Control ? Latest Reference Range & Units 11/22/21 08:19 11/22/21 12:52 11/22/21 16:39 11/22/21 21:04 11/23/21 07:32  ?Glucose-Capillary 70 - 99 mg/dL 185 (H) 197 (H) 195 (H) 228 (H) 203 (H)  ? ? ?Diabetes history: DM2 ?Outpatient Diabetes medications: Lantus 20 units QD, Novolog 0-9 units TID ?Current orders for Inpatient glycemic control: Novolog 0-9 units TID ? ?Inpatient Diabetes Program Recommendations:   ? ?Semglee 10 units QD (50% of home dose) ? ?Will continue to follow while inpatient. ? ?Thank you, ?Tama Headings RN, MSN, BC-ADM ?Inpatient Diabetes Coordinator ?Team Pager [phone] (8a-5p) ? ? ? ?

## 2021-11-23 NOTE — Progress Notes (Signed)
PROGRESS NOTE   [name], MD  QMV:[id number]    DOB: 10-Nov-1930    DOA: 11/21/2021  PCP: [name], MD   I have briefly reviewed patients previous medical records in [facility]  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  Admitted for suspected aspiration pneumonia/pneumonitis and physical debility.  Initiated on Unasyn.  She will be consulted and recommend regular consistency diet and thin liquids.  Clinically improving and stable for DC to SNF insurance authorization.   Assessment & Plan:  Principal Problem:   Aspiration pneumonia ([place]) Active Problems:   Choking episode   Nausea and vomiting   Generalized weakness   Type 2 diabetes mellitus with hyperglycemia (HCC)   Chronic kidney disease, stage 3a (HCC)   Hypomagnesemia   Assessment and Plan: * Aspiration pneumonia ([place]) Suspected during the choking episode. CT A/P showed basilar atelectasis but previous treating MD clinically felt the patient had aspiration pneumonia. Improved on IV Unasyn. Transition to Augmentin at discharge to complete total 5 to 7-day course. Incentive spirometry.  Choking episode Patient denies prior history of dysphagia. Reported choking on a pill when he was taking multiple pills which was then followed by nausea and vomiting. SLP evaluated and recommend regular consistency diet and thin liquids.  Nausea and vomiting Patient clearly indicates that this was not intractable. Resolved.  Tolerating diet. CT A/P negative for acute pathology.  Generalized weakness Fatigue Physical deconditioning Suspect due to choking episode, aspiration  pneumonia, dehydration and electrolyte abnormalities. Improving. SNF upon discharge for rehab.  Type 2 diabetes mellitus with hyperglycemia (HCC) Hemoglobin A1c 7.2 on 10/25/2021. Cover with insulin sliding scale.  Hypomagnesemia Replaced.  Chronic kidney disease, stage 3a (HCC) Creatinine has fluctuated somewhat recently but this appears to be his baseline.   Body mass index is 29.5 kg/m.    DVT prophylaxis: enoxaparin (LOVENOX) injection 40 mg Start: 11/22/21 2200     Code Status: Full Code:  Family Communication: None at bedside Disposition:  Status is: Inpatient Remains inpatient appropriate because: IV antibiotics for aspiration pneumonia.  Clinically improved/optimized for DC to SNF pending insurance authorization.    Consultants:     Procedures:     Antimicrobials:   As above   Subjective:  History as noted above.  Denies intractable nausea or vomiting.  Reports choking episode as described above followed by self-limited nausea and vomiting.  Tolerating diet.  Mild intermittent dry cough.  No chest pain or dyspnea.  Objective:   Vitals:   11/23/21 0031 11/23/21 0358 11/23/21 0735 11/23/21 1210  BP: 101/61 117/71 128/60 (!) 136/58  Pulse: 88 77 88 77  Resp: '18 18 18 19  '$ Temp: 97.9 F (36.6 C) 98.2 F (36.8 C) 98 F (36.7 C) 98 F (36.7 C)  TempSrc: Oral Oral    SpO2: 96% 98% 94% 95%  Weight:      Height:        General exam: Elderly male, moderately built and nourished sitting up comfortably at edge of bed Respiratory system: Clear to auscultation. Respiratory effort normal. Cardiovascular system: S1 & S2 heard, RRR. No JVD, murmurs, rubs, gallops or clicks. No pedal edema.  Telemetry personally reviewed: Sinus rhythm  Gastrointestinal system: Abdomen is nondistended, soft and nontender. No organomegaly or masses felt. Normal bowel sounds heard. Central nervous system: Alert and oriented. No focal neurological deficits. Extremities: Left limbs grade  5 x 5 power.  Right limbs grade 4 x 5 power. Skin: No rashes, lesions or ulcers Psychiatry: Judgement and insight appear normal. Mood & affect appropriate.     Data Reviewed:   I have personally reviewed following labs and imaging studies   CBC: Recent Labs  Lab 11/21/21 1049 11/23/21 0510  WBC 9.0 7.2  NEUTROABS 7.0 4.3  HGB 14.0 12.1*  HCT 42.2 36.8*  MCV 88.3 88.5  PLT 174 637    Basic Metabolic Panel: Recent Labs  Lab 11/21/21 1049 11/21/21 2330 11/23/21 0510  NA 134*  --  137  K 4.0  --  4.1  CL 104  --  105  CO2 22  --  25  GLUCOSE 218*  --  219*  BUN 18  --  14  CREATININE 1.37*  --  1.36*  CALCIUM 8.4*  --  8.5*  MG  --  1.6* 2.0  PHOS  --  3.1 3.5    Liver Function Tests: Recent Labs  Lab 11/21/21 1049 11/23/21 0510  AST 20 13*  ALT 10 10  ALKPHOS 49 43  BILITOT 1.2 0.7  PROT 7.0 6.1*  ALBUMIN 3.3* 2.6*    CBG: Recent Labs  Lab 11/22/21 2104 11/23/21 0732 11/23/21 1211  GLUCAP 228* 203* 229*    Microbiology Studies:   Recent Results (from the past 240 hour(s))  Blood Culture (routine x 2)     Status: None (Preliminary result)   Collection Time: 11/21/21 10:49 AM   Specimen: BLOOD  Result Value Ref Range Status   Specimen Description BLOOD RIGHT HAND  Final   Special Requests   Final    BOTTLES DRAWN AEROBIC AND ANAEROBIC Blood Culture adequate volume   Culture   Final    NO GROWTH 2 DAYS Performed at [facility], [address] [place], [place] [address]    Report Status PENDING  Incomplete  Blood Culture (routine x 2)     Status: None (Preliminary result)   Collection Time: 11/21/21 10:49 AM   Specimen: BLOOD  Result Value Ref Range Status   Specimen Description BLOOD RIGHT ARM  Final   Special Requests   Final    BOTTLES DRAWN AEROBIC AND ANAEROBIC Blood Culture adequate volume   Culture   Final    NO GROWTH 2 DAYS Performed at [facility], [address], [place], [place] [address]    Report  Status PENDING  Incomplete  Resp Panel by RT-PCR (Flu A&B, Covid) Nasopharyngeal Swab     Status: None   Collection Time: 11/21/21 10:49 AM   Specimen: Nasopharyngeal Swab; Nasopharyngeal(NP) swabs in vial transport medium  Result Value Ref Range Status   SARS Coronavirus 2 by RT PCR NEGATIVE NEGATIVE Final    Comment: (NOTE) SARS-CoV-2 target nucleic acids are NOT DETECTED.  The SARS-CoV-2 RNA is generally detectable in upper respiratory specimens during the acute phase of infection. The lowest concentration of SARS-CoV-2 viral copies this assay can detect is 138 copies/mL. A negative result does not preclude SARS-Cov-2 infection and should not be used as the sole basis for treatment or other patient management decisions. A negative result may occur with  improper specimen collection/handling, submission of specimen other than nasopharyngeal swab, presence of viral mutation(s) within the areas targeted by this assay,  and inadequate number of viral copies(<138 copies/mL). A negative result must be combined with clinical observations, patient history, and epidemiological information. The expected result is Negative.  Fact Sheet for Patients:  EntrepreneurPulse.com.au  Fact Sheet for Healthcare Providers:  IncredibleEmployment.be  This test is no t yet approved or cleared by the [place] FDA and  has been authorized for detection and/or diagnosis of SARS-CoV-2 by FDA under an Emergency Use Authorization (EUA). This EUA will remain  in effect (meaning this test can be used) for the duration of the COVID-19 declaration under Section 564(b)(1) of the Act, 21 U.S.C.section 360bbb-3(b)(1), unless the authorization is terminated  or revoked sooner.       Influenza A by PCR NEGATIVE NEGATIVE Final   Influenza B by PCR NEGATIVE NEGATIVE Final    Comment: (NOTE) The Xpert Xpress SARS-CoV-2/FLU/RSV plus assay is intended as an aid in the diagnosis  of influenza from Nasopharyngeal swab specimens and should not be used as a sole basis for treatment. Nasal washings and aspirates are unacceptable for Xpert Xpress SARS-CoV-2/FLU/RSV testing.  Fact Sheet for Patients: EntrepreneurPulse.com.au  Fact Sheet for Healthcare Providers: IncredibleEmployment.be  This test is not yet approved or cleared by the [place] FDA and has been authorized for detection and/or diagnosis of SARS-CoV-2 by FDA under an Emergency Use Authorization (EUA). This EUA will remain in effect (meaning this test can be used) for the duration of the COVID-19 declaration under Section 564(b)(1) of the Act, 21 U.S.C. section 360bbb-3(b)(1), unless the authorization is terminated or revoked.  Performed at [facility], [address] [place], [place] [address]     Radiology Studies:  MR BRAIN WO CONTRAST  Result Date: 11/21/2021 CLINICAL DATA:  Stroke, follow up EXAM: MRI HEAD WITHOUT CONTRAST TECHNIQUE: Multiplanar, multiecho pulse sequences of the brain and surrounding structures were obtained without intravenous contrast. COMPARISON:  08/26/2021 FINDINGS: Brain: There is no acute infarction or intracranial hemorrhage. There is no intracranial mass, mass effect, or edema. There is no hydrocephalus or extra-axial fluid collection. Prominence of the ventricles and sulci reflects similar parenchymal volume loss. Patchy and confluent areas of T2 hyperintensity in the supratentorial white matter are nonspecific probably reflect similar chronic microvascular ischemic changes. There is a chronic small vessel infarct of the left corona radiata and basal ganglia. Vascular: Major vessel flow voids at the skull base are preserved. Skull and upper cervical spine: Normal marrow signal is preserved. Sinuses/Orbits: Paranasal sinuses are aerated. Bilateral lens replacements. Other: Sella is unremarkable.  Mastoid air cells are clear.  IMPRESSION: No acute infarct, hemorrhage, or mass. No substantial change in chronic finding since prior study. Electronically Signed   By: Macy Mis M.D.   On: 11/21/2021 15:46   MR FOOT LEFT WO CONTRAST  Result Date: 11/21/2021 CLINICAL DATA:  Foot swelling, nondiabetic, osteomyelitis suspected. EXAM: MRI OF THE LEFT FOOT WITHOUT CONTRAST TECHNIQUE: Multiplanar, multisequence MR imaging of the left was performed. No intravenous contrast was administered. COMPARISON:  Radiograph performed earlier on the same date FINDINGS: Multiple sequences are degraded due to motion. Bones/Joint/Cartilage No fracture or dislocation. Normal alignment. No joint effusion. No marrow signal abnormality. Area of concern in the proximal phalanx of the second digit was not included on this examination. Ligaments Evaluation of collateral ligaments is limited due to motion. Lisfranc ligament is intact. Muscles and Tendons Flexor, peroneal and extensor compartment tendons are intact. Muscles are normal. Soft tissue No fluid collection or hematoma. No soft tissue mass. Subcutaneous soft tissue edema about  the dorsum of the foot. IMPRESSION: 1. Multiple sequences are limited due to motion. Within the limitation there is no MR evidence of acute osteomyelitis. Area of concern in the proximal phalanx of the second digit however was not included on this examination. If there is persistent clinical concern MR of the forefoot could be obtained for further evaluation. 2. Number soft tissue edema about the dorsum of the foot and about the ankle. Electronically Signed   By: Keane Police D.O.   On: 11/21/2021 16:16    Scheduled Meds:    chlorhexidine  15 mL Mouth Rinse BID   clopidogrel  75 mg Oral Daily   DULoxetine  60 mg Oral Daily   enoxaparin (LOVENOX) injection  40 mg Subcutaneous Q24H   gabapentin  600 mg Oral TID   guaiFENesin  1,200 mg Oral BID   insulin aspart  0-9 Units Subcutaneous TID WC   ipratropium-albuterol  3 mL  Nebulization BID   levothyroxine  50 mcg Oral Q0600   mouth rinse  15 mL Mouth Rinse q12n4p   melatonin  5 mg Oral QHS   pantoprazole  40 mg Oral Daily   pramipexole  1 mg Oral Daily   sodium chloride HYPERTONIC  4 mL Nebulization BID   tamsulosin  0.4 mg Oral QPC breakfast    Continuous Infusions:    ampicillin-sulbactam (UNASYN) IV 3 g (11/23/21 1313)   lactated ringers 50 mL/hr at 11/23/21 1416     LOS: 2 days     Vernell Leep, MD,  FACP, [facility], [facility], [facility] (Care Management Physician Certified) [place]  To contact the attending provider between 7A-7P or the covering provider during after hours 7P-7A, please log into the web site [email or web address] and access using universal [place] password for that web site. If you do not have the password, please call the hospital operator.  11/23/2021, 2:55 PM

## 2021-11-24 DIAGNOSIS — Z9989 Dependence on other enabling machines and devices: Secondary | ICD-10-CM | POA: Diagnosis not present

## 2021-11-24 DIAGNOSIS — M6281 Muscle weakness (generalized): Secondary | ICD-10-CM | POA: Diagnosis not present

## 2021-11-24 DIAGNOSIS — E1165 Type 2 diabetes mellitus with hyperglycemia: Secondary | ICD-10-CM | POA: Diagnosis not present

## 2021-11-24 DIAGNOSIS — K219 Gastro-esophageal reflux disease without esophagitis: Secondary | ICD-10-CM | POA: Diagnosis not present

## 2021-11-24 DIAGNOSIS — N1831 Chronic kidney disease, stage 3a: Secondary | ICD-10-CM | POA: Diagnosis not present

## 2021-11-24 DIAGNOSIS — J690 Pneumonitis due to inhalation of food and vomit: Secondary | ICD-10-CM | POA: Diagnosis not present

## 2021-11-24 DIAGNOSIS — I639 Cerebral infarction, unspecified: Secondary | ICD-10-CM | POA: Diagnosis not present

## 2021-11-24 DIAGNOSIS — G629 Polyneuropathy, unspecified: Secondary | ICD-10-CM | POA: Diagnosis not present

## 2021-11-24 DIAGNOSIS — E785 Hyperlipidemia, unspecified: Secondary | ICD-10-CM | POA: Diagnosis not present

## 2021-11-24 DIAGNOSIS — R6889 Other general symptoms and signs: Secondary | ICD-10-CM | POA: Diagnosis not present

## 2021-11-24 DIAGNOSIS — R1310 Dysphagia, unspecified: Secondary | ICD-10-CM | POA: Diagnosis not present

## 2021-11-24 DIAGNOSIS — I69351 Hemiplegia and hemiparesis following cerebral infarction affecting right dominant side: Secondary | ICD-10-CM | POA: Diagnosis not present

## 2021-11-24 DIAGNOSIS — R2689 Other abnormalities of gait and mobility: Secondary | ICD-10-CM | POA: Diagnosis not present

## 2021-11-24 DIAGNOSIS — I6930 Unspecified sequelae of cerebral infarction: Secondary | ICD-10-CM | POA: Diagnosis not present

## 2021-11-24 DIAGNOSIS — G4733 Obstructive sleep apnea (adult) (pediatric): Secondary | ICD-10-CM | POA: Diagnosis not present

## 2021-11-24 DIAGNOSIS — Z743 Need for continuous supervision: Secondary | ICD-10-CM | POA: Diagnosis not present

## 2021-11-24 DIAGNOSIS — E1169 Type 2 diabetes mellitus with other specified complication: Secondary | ICD-10-CM | POA: Diagnosis not present

## 2021-11-24 DIAGNOSIS — Z794 Long term (current) use of insulin: Secondary | ICD-10-CM | POA: Diagnosis not present

## 2021-11-24 DIAGNOSIS — R55 Syncope and collapse: Secondary | ICD-10-CM | POA: Diagnosis not present

## 2021-11-24 DIAGNOSIS — G4700 Insomnia, unspecified: Secondary | ICD-10-CM | POA: Diagnosis not present

## 2021-11-24 DIAGNOSIS — L603 Nail dystrophy: Secondary | ICD-10-CM | POA: Diagnosis not present

## 2021-11-24 DIAGNOSIS — R609 Edema, unspecified: Secondary | ICD-10-CM | POA: Diagnosis not present

## 2021-11-24 DIAGNOSIS — M2022 Hallux rigidus, left foot: Secondary | ICD-10-CM | POA: Diagnosis not present

## 2021-11-24 DIAGNOSIS — E1151 Type 2 diabetes mellitus with diabetic peripheral angiopathy without gangrene: Secondary | ICD-10-CM | POA: Diagnosis not present

## 2021-11-24 DIAGNOSIS — M2021 Hallux rigidus, right foot: Secondary | ICD-10-CM | POA: Diagnosis not present

## 2021-11-24 DIAGNOSIS — E039 Hypothyroidism, unspecified: Secondary | ICD-10-CM | POA: Diagnosis not present

## 2021-11-24 DIAGNOSIS — E1142 Type 2 diabetes mellitus with diabetic polyneuropathy: Secondary | ICD-10-CM | POA: Diagnosis not present

## 2021-11-24 LAB — BASIC METABOLIC PANEL
Anion gap: 7 (ref 5–15)
BUN: 16 mg/dL (ref 8–23)
CO2: 26 mmol/L (ref 22–32)
Calcium: 8.2 mg/dL — ABNORMAL LOW (ref 8.9–10.3)
Chloride: 104 mmol/L (ref 98–111)
Creatinine, Ser: 1.26 mg/dL — ABNORMAL HIGH (ref 0.61–1.24)
GFR, Estimated: 55 mL/min — ABNORMAL LOW (ref >60)
Glucose, Bld: 220 mg/dL — ABNORMAL HIGH (ref 70–99)
Potassium: 4.1 mmol/L (ref 3.5–5.1)
Sodium: 137 mmol/L (ref 135–145)

## 2021-11-24 LAB — GLUCOSE, CAPILLARY
Glucose-Capillary: 193 mg/dL — ABNORMAL HIGH (ref 70–99)
Glucose-Capillary: 206 mg/dL — ABNORMAL HIGH (ref 70–99)
Glucose-Capillary: 139 mg/dL — ABNORMAL HIGH (ref 70–99)

## 2021-11-24 MED ORDER — AMOXICILLIN-POT CLAVULANATE 875-125 MG PO TABS: 1.0000 | ORAL_TABLET | Freq: Two times a day (BID) | ORAL | Status: AC

## 2021-11-24 MED ORDER — AMOXICILLIN-POT CLAVULANATE 875-125 MG PO TABS
1.0000 | ORAL_TABLET | Freq: Two times a day (BID) | ORAL | Status: DC
  Administered 2021-11-24: 13:00:00 1 via ORAL
  Filled 2021-11-24: qty 1

## 2021-11-24 MED ORDER — GABAPENTIN 300 MG PO CAPS: 600.0000 mg | ORAL_CAPSULE | Freq: Three times a day (TID) | ORAL | Status: DC

## 2021-11-24 MED ORDER — GUAIFENESIN ER 600 MG PO TB12: 1200.0000 mg | ORAL_TABLET | Freq: Two times a day (BID) | ORAL | Status: AC

## 2021-11-24 NOTE — [place]2 (Signed)
?[place] MEDICAID FL2 LEVEL OF CARE SCREENING TOOL  ?  ? ?IDENTIFICATION  ?Patient Name: ?James Spurr, MD Birthdate: 07-06-1931 Sex: male Admission Date (Current Location): ?11/21/2021  ?[place] and [place] Number: ? [place] ?  Facility and Address:  ?[facility], [address], [place], [place] [address] ?     Provider Number: ?[id number]  ?Attending Physician Name and Address:  ?Modena Jansky, MD ? Relative Name and Phone Number:  ?Verry,Richard Alanson Puls)   [phone] [facility]) ?   ?Current Level of Care: ?Hospital Recommended Level of Care: ?[place] Prior Approval Number: ?  ? ?Date Approved/Denied: ?  PASRR Number: ?[id number] ? ?Discharge Plan: ?SNF ?  ? ?Current Diagnoses: ?Patient Active Problem List  ? Diagnosis Date Noted  ? Choking episode 11/23/2021  ? Aspiration pneumonia ([place]) 11/23/2021  ? Generalized weakness 11/23/2021  ? Type 2 diabetes mellitus with hyperglycemia ([place]) 11/23/2021  ? Hypomagnesemia 11/23/2021  ? C. difficile colitis   ? Diarrhea 10/26/2021  ? Postural dizziness with presyncope 10/25/2021  ? Abrasion of heel, infected, left, initial encounter 10/25/2021  ? Cellulitis of left foot 10/25/2021  ? General weakness 05/12/2021  ? Right medial knee pain 05/12/2021  ? Frequent UTI 03/31/2021  ? Polypharmacy 03/10/2021  ? Acute encephalopathy 11/02/2020  ? Nausea and vomiting 10/18/2020  ? Healthcare maintenance 06/01/2020  ? Involuntary movements 04/25/2020  ? Post-nasal drainage 07/16/2019  ? Recurrent productive cough 05/31/2019  ? Hx of basal cell carcinoma 07/10/2017  ? Rosacea   ? Gait disturbance, post-stroke 10/05/2015  ? Presbycusis of both ears   ? Advanced care planning/counseling discussion 06/23/2015  ? Impaired mobility 04/23/2015  ? Hearing loss of right ear due to cerumen impaction 03/23/2015  ? HTN (hypertension) 03/18/2015  ? Diastolic dysfunction 03/11/7627  ? Spastic hemiparesis affecting dominant side ([place])  07/07/2014  ? Chest pressure 02/03/2014  ? Medicare annual wellness visit, subsequent 11/06/2013  ? Erectile dysfunction due to arterial insufficiency 02/05/2013  ? MDD (major depressive disorder), recurrent episode, moderate ([place])   ? Palpitations 06/26/2012  ? Hyperlipidemia associated with type 2 diabetes mellitus ([place])   ? Restless leg syndrome, severe   ? GERD (gastroesophageal reflux disease)   ? BPH with obstruction/lower urinary tract symptoms   ? History of CVA (cerebrovascular accident) 03/11/2012  ? Hemiparesis affecting right side as late effect of cerebrovascular accident (CVA) ([place]) 03/11/2012  ? Uncontrolled type 2 diabetes mellitus with hyperglycemia, with long-term current use of insulin ([place]) 03/11/2012  ? OSA on CPAP 03/11/2012  ? Chronic kidney disease, stage 3a ([place]) 03/11/2012  ? Migraines 03/11/2012  ? Thrombocytopenia ([place]) 2011  ? Cause of injury, fall 2011  ? ? ?Orientation RESPIRATION BLADDER Height & Weight   ?  ?Self, Time, Situation, Place ?   Continent Weight: 88 kg ?Height:  '5\' 8"'$  (172.7 cm)  ?BEHAVIORAL SYMPTOMS/MOOD NEUROLOGICAL BOWEL NUTRITION STATUS  ?    Continent Diet (Regular but no straws)  ?AMBULATORY STATUS COMMUNICATION OF NEEDS Skin   ?Extensive Assist Verbally Normal ?  ?  ?  ?    ?     ?     ? ? ?Personal Care Assistance Level of Assistance  ?Bathing, Feeding, Dressing Bathing Assistance: Limited assistance ?Feeding assistance: Limited assistance ?Dressing Assistance: Limited assistance ?   ? ?Functional Limitations Info  ?Sight, Hearing, Speech   ?  ?   ? ? ?SPECIAL CARE FACTORS FREQUENCY  ?PT (By licensed PT), OT (By licensed OT)   ?  ?  PT Frequency: min 5x weekly ?OT Frequency: min 5x weekly ?  ?  ?  ?   ? ? ?Contractures Contractures Info: Not present  ? ? ?Additional Factors Info  ?Code Status, Allergies Code Status Info: Full code ?  ?  ?  ?  ?   ? ?Current Medications (11/24/2021):  This is the current hospital active medication list ?Current Facility-Administered  Medications  ?Medication Dose Route Frequency Provider Last Rate Last Admin  ? acetaminophen (TYLENOL) tablet 650 mg  650 mg Oral Q6H PRN Pudota, Kathe Becton, MD      ? amoxicillin-clavulanate (AUGMENTIN) 875-125 MG per tablet 1 tablet  1 tablet Oral Q12H Modena Jansky, MD   1 tablet at 11/24/21 1301  ? chlorhexidine (PERIDEX) 0.12 % solution 15 mL  15 mL Mouth Rinse BID Dwyane Dee, MD   15 mL at 11/23/21 2026  ? clopidogrel (PLAVIX) tablet 75 mg  75 mg Oral Daily Dorothe Pea, RPH   75 mg at 11/24/21 0930  ? DULoxetine (CYMBALTA) DR capsule 60 mg  60 mg Oral Daily Dorothe Pea, RPH   60 mg at 11/24/21 0930  ? enoxaparin (LOVENOX) injection 40 mg  40 mg Subcutaneous Q24H Irene Pap N, DO   40 mg at 11/23/21 2026  ? famotidine (PEPCID) tablet 20 mg  20 mg Oral QHS PRN Carlyle Lipa, MD      ? gabapentin (NEURONTIN) capsule 600 mg  600 mg Oral TID Carlyle Lipa, MD   600 mg at 11/24/21 0930  ? guaiFENesin (MUCINEX) 12 hr tablet 1,200 mg  1,200 mg Oral BID Irene Pap N, DO   1,200 mg at 11/24/21 0930  ? insulin aspart (novoLOG) injection 0-9 Units  0-9 Units Subcutaneous TID WC Pudota, Kathe Becton, MD   3 Units at 11/24/21 1302  ? ipratropium-albuterol (DUONEB) 0.5-2.5 (3) MG/3ML nebulizer solution 3 mL  3 mL Nebulization BID Modena Jansky, MD   3 mL at 11/24/21 0802  ? levothyroxine (SYNTHROID) tablet 50 mcg  50 mcg Oral Q0600 Dorothe Pea, RPH   50 mcg at 11/24/21 0545  ? MEDLINE mouth rinse  15 mL Mouth Rinse q12n4p Dwyane Dee, MD   15 mL at 11/24/21 1307  ? melatonin tablet 5 mg  5 mg Oral QHS Carlyle Lipa, MD   5 mg at 11/23/21 2027  ? nitroGLYCERIN (NITROSTAT) SL tablet 0.4 mg  0.4 mg Sublingual Q5 min PRN Pudota, Kathe Becton, MD      ? ondansetron (ZOFRAN) injection 4 mg  4 mg Intravenous Q4H PRN Pudota, Kathe Becton, MD      ? pantoprazole (PROTONIX) EC tablet 40 mg  40 mg Oral Daily [place], Carole N, DO   40 mg at 11/24/21 0930  ? polyvinyl alcohol (LIQUIFILM TEARS) 1.4  % ophthalmic solution 1 drop  1 drop Both Eyes Q4H PRN P[place]ota, Kathe Becton, MD      ? pramipexole (MIRAPEX) tablet 1 mg  1 mg Oral Daily Pudota, Kathe Becton, MD   1 mg at 11/23/21 1746  ? sodium chloride HYPERTONIC 3 % nebulizer solution 4 mL  4 mL Nebulization BID Irene Pap N, DO   4 mL at 11/24/21 0802  ? tamsulosin (FLOMAX) capsule 0.4 mg  0.4 mg Oral QPC breakfast Carlyle Lipa, MD   0.4 mg at 11/24/21 0930  ? ? ? ?Discharge Medications: ?Please see discharge summary for a list of discharge medications. ? ?Relevant Imaging Results: ? ?Relevant  Lab Results: ? ? ?Additional Information ?SSN [id number] ? ?Pete Pelt, RN ? ? ? ? ?

## 2021-11-24 NOTE — TOC Progression Note (Signed)
Transition of Care (TOC) - Progression Note  ? ? ?Patient Details  ?Name: James Spurr, MD ?MRN: [id number] ?Date of Birth: [birth year] ? ?Transition of Care (TOC) CM/SW Contact  ?Pete Pelt, RN ?Phone Number: ?11/24/2021, 3:22 PM ? ?Clinical Narrative:   Patient transferring to [facility] room 304 as per D. Meyers today via ems.  RNCM left message for nephew. ? ? ? ?Expected Discharge Plan: [place] ?Barriers to Discharge: Continued Medical Work up ? ?Expected Discharge Plan and Services ?Expected Discharge Plan: [place] ?  ?Discharge Planning Services: CM Consult ?  ?Living arrangements for the past 2 months: [place] ?Expected Discharge Date: 11/24/21               ?  ?  ?  ?  ?  ?  ?  ?  ?  ?  ? ? ?Social Determinants of Health (SDOH) Interventions ?  ? ?Readmission Risk Interventions ?No flowsheet data found. ? ?

## 2021-11-24 NOTE — Progress Notes (Signed)
Patient mistakenly pulled  out his IV on his R arm at 0538. There was no bleeding, the catheter was intact, no infiltration noted or any abnormality. He didn't want another IV line started again, he still has another one on his Left arm. ?

## 2021-11-24 NOTE — Discharge Summary (Signed)
Physician Discharge Summary  James Spurr, MD XBM:[id number] DOB: 1931/05/30  PCP: [name], MD  Admitted from: SNF Discharged to: SNF  Admit date: 11/21/2021 Discharge date: 11/24/2021  Recommendations for Outpatient Follow-up:    Follow-up Information     [name], MD. Schedule an appointment as soon as possible for a visit.   Specialty: Family Medicine Why: Upon discharge from SNF. Contact information: [place] [address] [phone]         MD at SNF. Schedule an appointment as soon as possible for a visit.   Why: To be seen in 3 to 4 days with repeat labs (CBC & BMP).                 Home Health: None    Equipment/Devices: TBD at SNF    Discharge Condition: Improved and stable.   Code Status: Full Code Diet recommendation:  Discharge Diet Orders (From admission, onward)     Start     Ordered   11/24/21 0000  Diet - low sodium heart healthy       Comments: NO STRAWS!!!!   Extra gravy on any cut meats to moisten.  He likes the spaghetti CUT.   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  Admitted for suspected aspiration pneumonia/pneumonitis and physical debility.  Initiated on Unasyn.  SLP consulted, recommended regular consistency diet and thin liquids.  Clinically improved and  discharging to SNF for rehab.  I did a peer to peer review with Psychologist, occupational.  Assessment and Plan: * Aspiration pneumonia ([place]) Suspected during the choking episode. CT A/P showed basilar atelectasis but previous treating MD clinically felt the patient had aspiration pneumonia. Improved on IV Unasyn.  Completed about 3 days course.  Communicated with pharmacy and transitioned to Augmentin to complete total 5 days course. Aggressive pulmonary toilet. May consider repeat imaging i.e. chest x-ray or CT chest without contrast in 4 weeks to ensure resolution of abnormal findings.  Choking episode Patient denies prior history of dysphagia. Reported choking on a pill when he was taking multiple pills which was then followed by nausea and vomiting. SLP evaluated and recommend regular consistency diet and thin liquids. No recurrence of episodes.  Nausea and vomiting Patient clearly indicates that this was not intractable. Resolved.  Tolerating diet. CT A/P negative for acute pathology.  Generalized weakness Fatigue Physical deconditioning Suspect due to choking episode, aspiration pneumonia, dehydration and electrolyte abnormalities. Improving. SNF upon discharge for rehab.  It appears that patient's insurance had initially denied his SNF admission.  I did a peer to peer review with Dr. Filbert Schilder, insurance medical director and was able to overturn the denial.  Type 2 diabetes mellitus with hyperglycemia ([place]) Hemoglobin A1c 7.2 on 10/25/2021.   Resume prior to admission meds  at discharge.  Close monitoring of CBGs at SNF.  Hypomagnesemia Replaced.  Chronic kidney disease, stage 3a (HCC) Creatinine has fluctuated somewhat recently but this appears to be his baseline.  Creatinine down to 1.26 on day of discharge.   Body mass index is 29.5 kg/m.   Consultations: None  Procedures: None   Discharge Instructions  Discharge Instructions     Call MD for:  difficulty  breathing, headache or visual disturbances   Complete by: As directed    Call MD for:  extreme fatigue   Complete by: As directed    Call MD for:  persistant dizziness or light-headedness   Complete by: As directed    Call MD for:  persistant nausea and vomiting   Complete by: As directed    Call MD for:  severe uncontrolled pain   Complete by: As directed    Call MD for:  temperature >100.4   Complete by: As directed    Diet - low sodium heart healthy   Complete by: As directed    NO STRAWS!!!!   Extra gravy on any cut meats to moisten.  He likes the spaghetti CUT.   Diet Carb Modified   Complete by: As directed    Increase activity slowly   Complete by: As directed         Medication List     STOP taking these medications    celecoxib 200 MG capsule Commonly known as: CELEBREX   RABEprazole 20 MG tablet Commonly known as: ACIPHEX       TAKE these medications    acetaminophen 325 MG tablet Commonly known as: TYLENOL Take 2 tablets (650 mg total) by mouth every 6 (six) hours as needed for mild pain (or Fever >/= 101).   Alpha-Lipoic Acid 600 MG Caps Take 1 capsule (600 mg total) by mouth daily.   amoxicillin-clavulanate 875-125 MG tablet Commonly known as: AUGMENTIN Take 1 tablet by mouth every 12 (twelve) hours for 5 doses.   clopidogrel 75 MG tablet Commonly known as: PLAVIX TAKE 1 TABLET DAILY   COQ10 150 MG Caps Take 1 capsule by mouth daily.   DULoxetine 60 MG capsule Commonly known as: CYMBALTA Take 1 capsule (60 mg total) by mouth daily.   famotidine 20 MG tablet Commonly known as: Pepcid Take 1 tablet (20 mg total) by mouth at bedtime.   gabapentin 300 MG capsule Commonly known as: NEURONTIN Take 2 capsules (600 mg total) by mouth 3 (three) times daily. 600 mg BID is his minimum dose without having breakthrough symptoms. He is aware 900 mg/day is recommended per kidney function. What changed: when to take this   guaiFENesin 600 MG 12 hr  tablet Commonly known as: MUCINEX Take 2 tablets (1,200 mg total) by mouth 2 (two) times daily for 3 days.   insulin aspart 100 UNIT/ML injection Commonly known as: novoLOG CBG 70 - 120: 0 units  CBG 121 - 150: 1 unit  CBG 151 - 200: 2 units  CBG 201 - 250: 3 units  CBG 251 - 300: 5 units  CBG 301 - 350: 7 units  CBG 351 - 400: 9 units   insulin glargine 100 UNIT/ML injection Commonly known as: LANTUS Inject 0.2 mLs (20 Units total) into the skin at bedtime.   levothyroxine 50 MCG tablet Commonly known as: SYNTHROID Take 1 tablet (50 mcg total) by mouth daily before breakfast. Per endo [name]   melatonin 5 MG Tabs Take 5 mg by mouth at  bedtime.   nitroGLYCERIN 0.4 MG SL tablet Commonly known as: Nitrostat DISSOLVE 1 TABLET UNDER THE TONGUE EVERY 5 MINUTES AS NEEDED FOR CHEST PAIN   ondansetron 4 MG tablet Commonly known as: Zofran Take 1 tablet (4 mg total) by mouth every 8 (eight) hours as needed for nausea or vomiting.   pantoprazole 40 MG tablet Commonly known as: PROTONIX Take 40 mg by mouth daily.   polyethylene glycol 17 g packet Commonly known as: MIRALAX / GLYCOLAX Take 17 g by mouth daily as needed.   pramipexole 1 MG tablet Commonly known as: MIRAPEX Take 1 tablet (1 mg total) by mouth daily. At 4pm   sodium chloride 5 % ophthalmic solution Commonly known as: MURO 128 1 drop as needed for eye irritation.   tamsulosin 0.4 MG Caps capsule Commonly known as: FLOMAX Take 1 capsule (0.4 mg total) by mouth daily after breakfast.   UltiCare Micro Pen Needles 31G X 8 MM Misc Generic drug: Insulin Pen Needle Use to inject medication daily   vitamin C 500 MG tablet Commonly known as: ASCORBIC ACID Take 1,000 mg by mouth daily as needed.       Allergies  Allergen Reactions   Dulaglutide     Other reaction(s): upset stomach Other reaction(s): Abdominal Pain, upset stomach Other reaction(s): upset stomach    Empagliflozin Other (See Comments)     Other reaction(s): yeast infection Other reaction(s): Other (See Comments) Other reaction(s): yeast infection    Lisinopril Cough    tachycardia   Nexium [Esomeprazole] Other (See Comments)    Headache   Oxycodone Other (See Comments)    Even 1/2 tablet of '5mg'$  caused increased confusion - contributory to hospitalization 10/2020 with AMS   Pregabalin Other (See Comments) and Nausea Only    Other reaction(s): MALAISE, SLIGHT NAUSEA, SLIGHT HEADACHE Other reaction(s): MALAISE, SLIGHT NAUSEA, SLIGHT HEADACHE    Rosuvastatin Other (See Comments)    Other reaction(s): muscle pain 2.16.2022 Pt is current on this medication without any issues. Other reaction(s): muscle pain 2.16.2022 Pt is current on this medication without any issues. Other reaction(s): muscle pain      Procedures/Studies: CT HEAD WO CONTRAST (5MM)  Result Date: 11/21/2021 CLINICAL DATA:  Mental status change, unknown cause. EXAM: CT HEAD WITHOUT CONTRAST TECHNIQUE: Contiguous axial images were obtained from the base of the skull through the vertex without intravenous contrast. RADIATION DOSE REDUCTION: This exam was performed according to the departmental dose-optimization program which includes automated exposure control, adjustment of the mA and/or kV according to patient size and/or use of iterative reconstruction technique. COMPARISON:  Prior head CT examinations 10/25/2021 and earlier. Brain MRI 08/26/2021. FINDINGS: There is residual circulating contrast from the abdominopelvic CT performed earlier today. Brain: Mild generalized cerebral atrophy. Redemonstrated chronic infarct within the left corona radiata/internal capsule and lentiform nucleus. Background mild patchy and ill-defined hypoattenuation within the cerebral white matter, nonspecific but compatible chronic small vessel ischemic disease. There is no acute intracranial hemorrhage. No demarcated cortical infarct. No extra-axial fluid collection. No evidence of an  intracranial mass. No midline shift. Vascular: Residual circulating contrast limits evaluation for hyperdense vessels. Atherosclerotic calcification. Skull: Normal. Negative for fracture or focal lesion. Sinuses/Orbits: Visualized orbits show no acute finding. Trace mucosal thickening within the bilateral frontoethmoidal recesses and anterior ethmoid air cells. Other: Redemonstrated subcentimeter retained metallic foreign body within the frontal calvarium/scalp. IMPRESSION: No evidence of acute intracranial abnormality. Redemonstrated chronic infarct within the left corona radiata/internal capsule and left basal ganglia. Background mild chronic  small vessel ischemic changes within the cerebral white matter. Mild generalized cerebral atrophy. Electronically Signed   By: Kellie Simmering D.O.   On: 11/21/2021 14:26   MR BRAIN WO CONTRAST  Result Date: 11/21/2021 CLINICAL DATA:  Stroke, follow up EXAM: MRI HEAD WITHOUT CONTRAST TECHNIQUE: Multiplanar, multiecho pulse sequences of the brain and surrounding structures were obtained without intravenous contrast. COMPARISON:  08/26/2021 FINDINGS: Brain: There is no acute infarction or intracranial hemorrhage. There is no intracranial mass, mass effect, or edema. There is no hydrocephalus or extra-axial fluid collection. Prominence of the ventricles and sulci reflects similar parenchymal volume loss. Patchy and confluent areas of T2 hyperintensity in the supratentorial white matter are nonspecific probably reflect similar chronic microvascular ischemic changes. There is a chronic small vessel infarct of the left corona radiata and basal ganglia. Vascular: Major vessel flow voids at the skull base are preserved. Skull and upper cervical spine: Normal marrow signal is preserved. Sinuses/Orbits: Paranasal sinuses are aerated. Bilateral lens replacements. Other: Sella is unremarkable.  Mastoid air cells are clear. IMPRESSION: No acute infarct, hemorrhage, or mass. No substantial  change in chronic finding since prior study. Electronically Signed   By: Macy Mis M.D.   On: 11/21/2021 15:46   CT ABDOMEN PELVIS W CONTRAST  Result Date: 11/21/2021 CLINICAL DATA:  Nausea and vomiting.  Possible sepsis. EXAM: CT ABDOMEN AND PELVIS WITH CONTRAST TECHNIQUE: Multidetector CT imaging of the abdomen and pelvis was performed using the standard protocol following bolus administration of intravenous contrast. RADIATION DOSE REDUCTION: This exam was performed according to the departmental dose-optimization program which includes automated exposure control, adjustment of the mA and/or kV according to patient size and/or use of iterative reconstruction technique. CONTRAST:  133m OMNIPAQUE IOHEXOL 300 MG/ML  SOLN COMPARISON:  CT abdomen dated April 02, 2021. CT abdomen pelvis dated July 01, 2020. FINDINGS: Lower chest: Patchy subsegmental atelectasis at the lung bases. Hepatobiliary: No focal liver abnormality is seen. No gallstones, gallbladder wall thickening, or biliary dilatation. Pancreas: Atrophic. No ductal dilatation or surrounding inflammatory changes. Spleen: Normal in size without focal abnormality. Adrenals/Urinary Tract: Adrenal glands are unremarkable. Unchanged punctate right renal calculus and bilateral renal cysts. No hydronephrosis. Bladder is unremarkable. Stomach/Bowel: Stomach is within normal limits. Diminutive or absent appendix. No evidence of bowel wall thickening, distention, or inflammatory changes. Moderate left-sided colonic diverticulosis. Vascular/Lymphatic: Aortic atherosclerosis. Unchanged 1.3 cm densely calcified splenic artery aneurysm. No enlarged abdominal or pelvic lymph nodes. Reproductive: Prostate is unremarkable. Other: Unchanged small fat containing left inguinal hernia. Prior right inguinal hernia repair. No free fluid or pneumoperitoneum. Musculoskeletal: No acute or significant osseous findings. IMPRESSION: 1. No acute intra-abdominal process. 2.  Unchanged punctate right nephrolithiasis. 3. Aortic Atherosclerosis (ICD10-I70.0). Electronically Signed   By: WTitus DubinM.D.   On: 11/21/2021 12:15   MR FOOT LEFT WO CONTRAST  Result Date: 11/21/2021 CLINICAL DATA:  Foot swelling, nondiabetic, osteomyelitis suspected. EXAM: MRI OF THE LEFT FOOT WITHOUT CONTRAST TECHNIQUE: Multiplanar, multisequence MR imaging of the left was performed. No intravenous contrast was administered. COMPARISON:  Radiograph performed earlier on the same date FINDINGS: Multiple sequences are degraded due to motion. Bones/Joint/Cartilage No fracture or dislocation. Normal alignment. No joint effusion. No marrow signal abnormality. Area of concern in the proximal phalanx of the second digit was not included on this examination. Ligaments Evaluation of collateral ligaments is limited due to motion. Lisfranc ligament is intact. Muscles and Tendons Flexor, peroneal and extensor compartment tendons are intact. Muscles are normal. Soft tissue No  fluid collection or hematoma. No soft tissue mass. Subcutaneous soft tissue edema about the dorsum of the foot. IMPRESSION: 1. Multiple sequences are limited due to motion. Within the limitation there is no MR evidence of acute osteomyelitis. Area of concern in the proximal phalanx of the second digit however was not included on this examination. If there is persistent clinical concern MR of the forefoot could be obtained for further evaluation. 2. Number soft tissue edema about the dorsum of the foot and about the ankle. Electronically Signed   By: Keane Police D.O.   On: 11/21/2021 16:16   DG Chest Port 1 View  Result Date: 11/21/2021 CLINICAL DATA:  Possible sepsis EXAM: PORTABLE CHEST 1 VIEW COMPARISON:  Previous studies including the examination of 10/25/2021 FINDINGS: Cardiac size is within normal limits. There is poor inspiration. There are no signs of alveolar pulmonary edema or focal pulmonary consolidation. There is no pleural  effusion or pneumothorax. IMPRESSION: Crowding of markings in the lower lung fields may be due to poor inspiration. There are no signs of pulmonary edema or focal pulmonary consolidation. Electronically Signed   By: Elmer Picker M.D.   On: 11/21/2021 11:06   DG Foot Complete Left  Result Date: 11/21/2021 CLINICAL DATA:  Open wound in the skin EXAM: LEFT FOOT - COMPLETE 3+ VIEW COMPARISON:  10/25/2021 FINDINGS: No fracture or dislocation is seen. In the oblique projection, there is possible small radiolucency in the medial aspect of distal shaft of proximal phalanx of the second toe. There are no radiopaque foreign bodies. IMPRESSION: No fracture or dislocation is seen. There is small radiolucency along the medial cortical margin of proximal phalanx of left second toe seen in the single view. If clinically warranted, follow-up MRI may be considered to rule out osteomyelitis. Electronically Signed   By: Elmer Picker M.D.   On: 11/21/2021 11:40    Subjective: Patient denies complaints.  Had a episode of somewhat soft BM this morning.  Tolerating diet without nausea, vomiting or abdominal pain.  Denies dyspnea, chest pain, dizziness or lightheadedness.  Discharge Exam:  Vitals:   11/24/21 0124 11/24/21 0544 11/24/21 0924 11/24/21 1256  BP: 131/64 107/63 (!) 117/53 (!) 143/56  Pulse: 60 61 (!) 56 65  Resp: '18 19 18 18  '$ Temp: 97.7 F (36.5 C) 97.8 F (36.6 C) 97.8 F (36.6 C) (!) 97.5 F (36.4 C)  TempSrc:      SpO2: 96% 96% 98% 100%  Weight:      Height:        General exam: Elderly male, moderately built and nourished sitting up comfortably in reclining chair reading a novel. Respiratory system: Clear to auscultation. Respiratory effort normal. Cardiovascular system: S1 & S2 heard, RRR. No JVD, murmurs, rubs, gallops or clicks. No pedal edema.  Telemetry personally reviewed: Sinus rhythm Gastrointestinal system: Abdomen is nondistended, soft and nontender. No organomegaly or  masses felt. Normal bowel sounds heard. Central nervous system: Alert and oriented. No focal neurological deficits. Extremities: Left limbs grade 5 x 5 power.  Right limbs grade 4 x 5 power.  Personally examined bilateral feet, healed left heel prior lesion without acute findings in either feet. Skin: No rashes, lesions or ulcers Psychiatry: Judgement and insight appear normal. Mood & affect appropriate.     The results of significant diagnostics from this hospitalization (including imaging, microbiology, ancillary and laboratory) are listed below for reference.     Microbiology: Recent Results (from the past 240 hour(s))  Blood Culture (routine x  2)     Status: None (Preliminary result)   Collection Time: 11/21/21 10:49 AM   Specimen: BLOOD  Result Value Ref Range Status   Specimen Description BLOOD RIGHT HAND  Final   Special Requests   Final    BOTTLES DRAWN AEROBIC AND ANAEROBIC Blood Culture adequate volume   Culture   Final    NO GROWTH 3 DAYS Performed at [facility], [address] [place], [place] [address]    Report Status PENDING  Incomplete  Blood Culture (routine x 2)     Status: None (Preliminary result)   Collection Time: 11/21/21 10:49 AM   Specimen: BLOOD  Result Value Ref Range Status   Specimen Description BLOOD RIGHT ARM  Final   Special Requests   Final    BOTTLES DRAWN AEROBIC AND ANAEROBIC Blood Culture adequate volume   Culture   Final    NO GROWTH 3 DAYS Performed at [facility], [address] [place], [place] [address]    Report Status PENDING  Incomplete  Resp Panel by RT-PCR (Flu A&B, Covid) Nasopharyngeal Swab     Status: None   Collection Time: 11/21/21 10:49 AM   Specimen: Nasopharyngeal Swab; Nasopharyngeal(NP) swabs in vial transport medium  Result Value Ref Range Status   SARS Coronavirus 2 by RT PCR NEGATIVE NEGATIVE Final    Comment: (NOTE) SARS-CoV-2 target nucleic acids are NOT DETECTED.  The SARS-CoV-2 RNA is  generally detectable in upper respiratory specimens during the acute phase of infection. The lowest concentration of SARS-CoV-2 viral copies this assay can detect is 138 copies/mL. A negative result does not preclude SARS-Cov-2 infection and should not be used as the sole basis for treatment or other patient management decisions. A negative result may occur with  improper specimen collection/handling, submission of specimen other than nasopharyngeal swab, presence of viral mutation(s) within the areas targeted by this assay, and inadequate number of viral copies(<138 copies/mL). A negative result must be combined with clinical observations, patient history, and epidemiological information. The expected result is Negative.  Fact Sheet for Patients:  EntrepreneurPulse.com.au  Fact Sheet for Healthcare Providers:  IncredibleEmployment.be  This test is no t yet approved or cleared by the [place] FDA and  has been authorized for detection and/or diagnosis of SARS-CoV-2 by FDA under an Emergency Use Authorization (EUA). This EUA will remain  in effect (meaning this test can be used) for the duration of the COVID-19 declaration under Section 564(b)(1) of the Act, 21 U.S.C.section 360bbb-3(b)(1), unless the authorization is terminated  or revoked sooner.       Influenza A by PCR NEGATIVE NEGATIVE Final   Influenza B by PCR NEGATIVE NEGATIVE Final    Comment: (NOTE) The Xpert Xpress SARS-CoV-2/FLU/RSV plus assay is intended as an aid in the diagnosis of influenza from Nasopharyngeal swab specimens and should not be used as a sole basis for treatment. Nasal washings and aspirates are unacceptable for Xpert Xpress SARS-CoV-2/FLU/RSV testing.  Fact Sheet for Patients: EntrepreneurPulse.com.au  Fact Sheet for Healthcare Providers: IncredibleEmployment.be  This test is not yet approved or cleared by the [place] FDA and has been authorized for detection and/or diagnosis of SARS-CoV-2 by FDA under an Emergency Use Authorization (EUA). This EUA will remain in effect (meaning this test can be used) for the duration of the COVID-19 declaration under Section 564(b)(1) of the Act, 21 U.S.C. section 360bbb-3(b)(1), unless the authorization is terminated or revoked.  Performed at [facility], [place],  [place], [place] [address]      Labs: CBC: Recent Labs  Lab 11/21/21 1049 11/23/21 0510  WBC 9.0 7.2  NEUTROABS 7.0 4.3  HGB 14.0 12.1*  HCT 42.2 36.8*  MCV 88.3 88.5  PLT 174 034    Basic Metabolic Panel: Recent Labs  Lab 11/21/21 1049 11/21/21 2330 11/23/21 0510 11/24/21 0512  NA 134*  --  137 137  K 4.0  --  4.1 4.1  CL 104  --  105 104  CO2 22  --  25 26  GLUCOSE 218*  --  219* 220*  BUN 18  --  14 16  CREATININE 1.37*  --  1.36* 1.26*  CALCIUM 8.4*  --  8.5* 8.2*  MG  --  1.6* 2.0  --   PHOS  --  3.1 3.5  --     Liver Function Tests: Recent Labs  Lab 11/21/21 1049 11/23/21 0510  AST 20 13*  ALT 10 10  ALKPHOS 49 43  BILITOT 1.2 0.7  PROT 7.0 6.1*  ALBUMIN 3.3* 2.6*    CBG: Recent Labs  Lab 11/23/21 1647 11/23/21 1709 11/23/21 1956 11/24/21 0919 11/24/21 1253  GLUCAP 213* 211* 175* 193* 206*    Anemia work up Recent Labs    11/21/21 2330  VITAMINB12 368  FOLATE 15.2    Urinalysis    Component Value Date/Time   COLORURINE YELLOW (A) 11/21/2021 1344   APPEARANCEUR CLEAR (A) 11/21/2021 1344   LABSPEC 1.017 11/21/2021 1344   PHURINE 5.0 11/21/2021 1344   GLUCOSEU 50 (A) 11/21/2021 1344   GLUCOSEU >=1000 (A) 10/17/2021 1509   HGBUR NEGATIVE 11/21/2021 1344   BILIRUBINUR NEGATIVE 11/21/2021 1344   BILIRUBINUR negative 10/18/2020 0902   KETONESUR NEGATIVE 11/21/2021 1344   PROTEINUR 30 (A) 11/21/2021 1344   UROBILINOGEN 0.2 10/17/2021 1509   NITRITE NEGATIVE 11/21/2021 1344   LEUKOCYTESUR NEGATIVE 11/21/2021 1344    I  discussed with patient's daughter via phone, updated care and answered all questions.  Time coordinating discharge: 40 minutes  SIGNED:  Vernell Leep, MD,  FACP, [facility], [facility], [facility] (Care Management Physician Certified). [facility]  To contact the attending provider between 7A-7P or the covering provider during after hours 7P-7A, please log into the web site [email or web address] and access using universal [place] password for that web site. If you do not have the password, please call the hospital operator.

## 2021-11-24 NOTE — Progress Notes (Signed)
Occupational Therapy Treatment ?Patient Details ?Name: James Spurr, MD ?MRN: [id number] ?DOB: 03/23/1931 ?Today's Date: 11/24/2021 ? ? ?History of present illness Patient is an 88 year old male with a PMH (+) for CKD, CVA, GERD, recent admission for cellulitis wound infection presents to the ER for nausea vomiting and generalized malaise. ?  ?OT comments ? [name] demonstrated good progress today, able to perform multiple sit<>stand transfers from various surfaces/heights. During first transfer attempt, pt required Min A to power up into standing, but on subsequent attempts was able to perform with SUPV-CGA -- no physical assist needed. Pt demonstrated fair balance but was able to show increased tolerance/improved strength for standing. He continues to require at least Min A for all ADL besides self-feeding, therefore recommend DC to SNF to assist pt in building strength, endurance, balance to facilitate return to PLOF.  ? ?Recommendations for follow up therapy are one component of a multi-disciplinary discharge planning process, led by the attending physician.  Recommendations may be updated based on patient status, additional functional criteria and insurance authorization. ?   ?Follow Up Recommendations ? Skilled nursing-short term rehab (<3 hours/day)  ?  ?Assistance Recommended at Discharge Frequent or constant Supervision/Assistance  ?Patient can return home with the following ? A little help with walking and/or transfers;A little help with bathing/dressing/bathroom;Help with stairs or ramp for entrance;Assist for transportation;Assistance with cooking/housework ?  ?Equipment Recommendations ? BSC/3in1  ?  ?Recommendations for Other Services   ? ?  ?Precautions / Restrictions Precautions ?Precautions: Fall ?Restrictions ?Weight Bearing Restrictions: No  ? ? ?  ? ?Mobility Bed Mobility ?  ?  ?  ?  ?  ?  ?  ?General bed mobility comments: received, left in recliner ?  ? ?Transfers ?Overall transfer  level: Needs assistance ?Equipment used: Rolling walker (2 wheels) ?Transfers: Sit to/from Stand, Bed to chair/wheelchair/BSC ?Sit to Stand: Min assist, Min guard ?Stand pivot transfers: Min guard ?  ?Step pivot transfers: Min guard ?  ?  ?  ?  ?  ?Balance Overall balance assessment: Needs assistance ?Sitting-balance support: Bilateral upper extremity supported, Feet unsupported ?Sitting balance-Leahy Scale: Good ?  ?  ?Standing balance support: Bilateral upper extremity supported, During functional activity, Reliant on assistive device for balance ?Standing balance-Leahy Scale: Fair ?  ?  ?  ?  ?  ?  ?  ?  ?  ?  ?  ?  ?   ? ?ADL either performed or assessed with clinical judgement  ? ?ADL Overall ADL's : Needs assistance/impaired ?  ?  ?  ?  ?  ?  ?  ?  ?  ?  ?  ?  ?Toilet Transfer: Minimal assistance;BSC/3in1 ?  ?Toileting- Clothing Manipulation and Hygiene: Minimal assistance ?  ?  ?  ?  ?  ?  ? ?Extremity/Trunk Assessment Upper Extremity Assessment ?Upper Extremity Assessment: Generalized weakness;RUE deficits/detail ?RUE Deficits / Details: limited elbow, wrist ext/flex, 3/5 strength ?RUE Coordination: decreased fine motor;decreased gross motor ?  ?Lower Extremity Assessment ?Lower Extremity Assessment: Generalized weakness ?RLE Deficits / Details: 3/5 strength ?  ?Cervical / Trunk Assessment ?Cervical / Trunk Assessment: Kyphotic ?  ? ?Vision   ?  ?  ?Perception   ?  ?Praxis   ?  ? ?Cognition Arousal/Alertness: Awake/alert ?Behavior During Therapy: [facility] for tasks assessed/performed ?Overall Cognitive Status: Within Functional Limits for tasks assessed ?  ?  ?  ?  ?  ?  ?  ?  ?  ?  ?  ?  ?  ?  ?  ?  ?  General Comments: Pleasant and cooperative ?  ?  ?   ?Exercises Other Exercises ?Other Exercises: x5 sit<>stand, stand/pivot t/fs. Standing balance tolerance. Educ re: falls prevention. ? ?  ?Shoulder Instructions   ? ? ?  ?General Comments    ? ? ?Pertinent Vitals/ Pain       Pain Assessment ?Pain Score: 0-No  pain ? ?Home Living   ?  ?  ?  ?  ?  ?  ?  ?  ?  ?  ?  ?  ?  ?  ?  ?  ?  ?  ? ?  ?Prior Functioning/Environment    ?  ?  ?  ?   ? ?Frequency ? Min 2X/week  ? ? ? ? ?  ?Progress Toward Goals ? ?OT Goals(current goals can now be found in the care plan section) ? Progress towards OT goals: Progressing toward goals ? ?Acute Rehab OT Goals ?Patient Stated Goal: to go home ?OT Goal Formulation: With patient ?Time For Goal Achievement: 12/06/21 ?Potential to Achieve Goals: Good  ?Plan Discharge plan remains appropriate;Frequency remains appropriate   ? ?Co-evaluation ? ? ?   ?  ?  ?  ?  ? ?  ?AM-PAC OT "6 Clicks" Daily Activity     ?Outcome Measure ? ? Help from another person eating meals?: None ?Help from another person taking care of personal grooming?: A Little ?Help from another person toileting, which includes using toliet, bedpan, or urinal?: A Little ?Help from another person bathing (including washing, rinsing, drying)?: A Lot ?Help from another person to put on and taking off regular upper body clothing?: A Little ?Help from another person to put on and taking off regular lower body clothing?: A Lot ?6 Click Score: 17 ? ?  ?End of Session Equipment Utilized During Treatment: Rolling walker (2 wheels) ? ?OT Visit Diagnosis: Other abnormalities of gait and mobility (R26.89);Muscle weakness (generalized) (M62.81) ?  ?Activity Tolerance Patient tolerated treatment well ?  ?Patient Left in chair;with call bell/phone within reach;with nursing/sitter in room ?  ?Nurse Communication Mobility status ?  ? ?   ? ?Time: 7741-4239 ?OT Time Calculation (min): 10 min ? ?Charges: OT General Charges ?$OT Visit: 1 Visit ?OT Treatments ?$Self Care/Home Management : 8-22 mins ? ?Josiah Lobo, PhD, MS, OTR/L ?11/24/21, 1:34 PM ? ?

## 2021-11-24 NOTE — TOC Progression Note (Signed)
Transition of Care (TOC) - Progression Note  ? ? ?Patient Details  ?Name: James Spurr, MD ?MRN: [id number] ?Date of Birth: Jan 06, 1931 ? ?Transition of Care (TOC) CM/SW Contact  ?Pete Pelt, RN ?Phone Number: ?11/24/2021, 9:35 AM ? ?Clinical Narrative:   Authorization for return to [facility] started yesterday, still pending at 0935am today.  TOC to follow. ? ? ? ?Expected Discharge Plan: [place] ?Barriers to Discharge: Continued Medical Work up ? ?Expected Discharge Plan and Services ?Expected Discharge Plan: [place] ?  ?Discharge Planning Services: CM Consult ?  ?Living arrangements for the past 2 months: [place] ?                ?  ?  ?  ?  ?  ?  ?  ?  ?  ?  ? ? ?Social Determinants of Health (SDOH) Interventions ?  ? ?Readmission Risk Interventions ?No flowsheet data found. ? ?

## 2021-11-24 NOTE — TOC Progression Note (Signed)
Transition of Care (TOC) - Progression Note  ? ? ?Patient Details  ?Name: [name], MD ?MRN: [id number] ?Date of Birth: Mar 22, 1931 ? ?Transition of Care (TOC) CM/SW Contact  ?Pete Pelt, RN ?Phone Number: ?11/24/2021, 1:40 PM ? ?Clinical Narrative:   Approval for SNF from [facility]: [id number] ?[id number] ?SNF ?11/23/2021 ?11/24/2021-11/28/2021 ?Approved ? ?Dr. Algis Liming made aware.  Awaiting medical discharge. ? ? ? ?Expected Discharge Plan: [place] ?Barriers to Discharge: Continued Medical Work up ? ?Expected Discharge Plan and Services ?Expected Discharge Plan: [place] ?  ?Discharge Planning Services: CM Consult ?  ?Living arrangements for the past 2 months: [place] ?                ?  ?  ?  ?  ?  ?  ?  ?  ?  ?  ? ? ?Social Determinants of Health (SDOH) Interventions ?  ? ?Readmission Risk Interventions ?No flowsheet data found. ? ?

## 2021-11-24 NOTE — Progress Notes (Signed)
Physical Therapy Treatment ?Patient Details ?Name: James Spurr, MD ?MRN: [id number] ?DOB: [birth year] ?Today's Date: 11/24/2021 ? ? ?History of Present Illness Patient is an 88 year old male with a PMH (+) for CKD, CVA, GERD, recent admission for cellulitis wound infection presents to the ER for nausea vomiting and generalized malaise. ? ?  ?PT Comments  ? ? Patient tolerated session well and was agreeable to treatment. Upon entry into room patient was seated in recliner eating lunch. RN came in shortly after to administer medications (~3-5 minutes, time not billed for). Patient was able to demonstrate increased ease with sit to stand transfers from recliner at CGA/SBA with RW. Increased attempts and time to come to standing, however once in standing demonstrated fair balance. He was able to ambulate around the entire nurses station with RW at CGA/SBA with no LOB noted. Session finished with seated therapeutic exercises to work on BLE strengthening. Patient is progressing towards his goals and would continue to benefit from skilled physical therapy in order to optimize patient's return to PLOF. Continue to recommend STR upon discharge from acute hospitalization.  ?   ?Recommendations for follow up therapy are one component of a multi-disciplinary discharge planning process, led by the attending physician.  Recommendations may be updated based on patient status, additional functional criteria and insurance authorization. ? ?Follow Up Recommendations ? Skilled nursing-short term rehab (<3 hours/day) ?  ?  ?Assistance Recommended at Discharge Intermittent Supervision/Assistance  ?Patient can return home with the following A little help with walking and/or transfers;A little help with bathing/dressing/bathroom;Assistance with cooking/housework;Assistance with feeding;Direct supervision/assist for medications management;Direct supervision/assist for financial management;Assist for transportation;Help with stairs or  ramp for entrance ?  ?Equipment Recommendations ? Other (comment) (defer to next level of care)  ?  ?Recommendations for Other Services   ? ? ?  ?Precautions / Restrictions Precautions ?Precautions: Fall ?Restrictions ?Weight Bearing Restrictions: No  ?  ? ?Mobility ? Bed Mobility ?Overal bed mobility:  (not assessed as patient started and ended session in recliner) ?  ?  ?  ?  ?  ?  ?  ?Patient Response: Flat affect ? ?Transfers ?Overall transfer level: Needs assistance ?Equipment used: Rolling walker (2 wheels) ?Transfers: Sit to/from Stand ?Sit to Stand: Min guard (SBA, from recliner) ?  ?  ?  ?  ?  ?  ?  ? ?Ambulation/Gait ?Ambulation/Gait assistance: Min guard (SBA) ?Gait Distance (Feet): 200 Feet ?Assistive device: Rolling walker (2 wheels) ?Gait Pattern/deviations: Step-through pattern, Decreased step length - right, Decreased step length - left, Narrow base of support ?Gait velocity: decreased ?  ?  ?General Gait Details: no LOB noted with ambulation ? ? ?Stairs ?  ?  ?  ?  ?  ? ? ?Wheelchair Mobility ?  ? ?Modified Rankin (Stroke Patients Only) ?  ? ? ?  ?Balance Overall balance assessment: Needs assistance ?Sitting-balance support: Bilateral upper extremity supported, Feet unsupported ?Sitting balance-Leahy Scale: Good ?  ?  ?Standing balance support: Bilateral upper extremity supported, During functional activity, Reliant on assistive device for balance ?Standing balance-Leahy Scale: Fair ?  ?  ?  ?  ?  ?  ?  ?  ?  ?  ?  ?  ?  ? ?  ?Cognition Arousal/Alertness: Awake/alert ?Behavior During Therapy: [facility] for tasks assessed/performed ?Overall Cognitive Status: Within Functional Limits for tasks assessed ?  ?  ?  ?  ?  ?  ?  ?  ?  ?  ?  ?  ?  ?  ?  ?  ?  General Comments: Pleasant man, more awake/alert today ?  ?  ? ?  ?Exercises General Exercises - Lower Extremity ?Long Arc Quad: AROM, Strengthening, Both, Seated, 10 reps ?Hip Flexion/Marching: AROM, Strengthening, Both, Seated, 20 reps ?Other Exercises ?Other  Exercises: x3 additional sit to stands for strengthening at CGA/SBA ? ?  ?General Comments   ?  ?  ? ?Pertinent Vitals/Pain Pain Assessment ?Pain Assessment: No/denies pain ?Pain Intervention(s): Monitored during session  ? ? ?Home Living   ?  ?  ?  ?  ?  ?  ?  ?  ?  ?   ?  ?Prior Function    ?  ?  ?   ? ?PT Goals (current goals can now be found in the care plan section) Acute Rehab PT Goals ?Patient Stated Goal: to go home ?PT Goal Formulation: With patient ?Time For Goal Achievement: 12/06/21 ?Potential to Achieve Goals: Good ?Progress towards PT goals: Progressing toward goals ? ?  ?Frequency ? ? ? Min 2X/week ? ? ? ?  ?PT Plan Current plan remains appropriate  ? ? ?Co-evaluation   ?  ?  ?  ?  ? ?  ?AM-PAC PT "6 Clicks" Mobility   ?Outcome Measure ? Help needed turning from your back to your side while in a flat bed without using bedrails?: A Little ?Help needed moving from lying on your back to sitting on the side of a flat bed without using bedrails?: A Little ?Help needed moving to and from a bed to a chair (including a wheelchair)?: A Little ?Help needed standing up from a chair using your arms (e.g., wheelchair or bedside chair)?: A Little ?Help needed to walk in hospital room?: A Little ?Help needed climbing 3-5 steps with a railing? : A Lot ?6 Click Score: 17 ? ?  ?End of Session Equipment Utilized During Treatment: Gait belt ?Activity Tolerance: Patient tolerated treatment well ?Patient left: in chair;with call bell/phone within reach ?Nurse Communication: Mobility status ?PT Visit Diagnosis: Other abnormalities of gait and mobility (R26.89);Muscle weakness (generalized) (M62.81);Difficulty in walking, not elsewhere classified (R26.2) ?  ? ? ?Time: 9030-0923 ?PT Time Calculation (min) (ACUTE ONLY): 16 min ? ?Charges:  $Gait Training: 8-22 mins          ?          ? ?Iva Boop, PT  ?11/24/21. 1:29 PM ? ? ?

## 2021-11-24 NOTE — Discharge Instructions (Signed)

## 2021-11-26 LAB — CULTURE, BLOOD (ROUTINE X 2)
Culture: NO GROWTH
Culture: NO GROWTH
Special Requests: ADEQUATE
Special Requests: ADEQUATE

## 2021-11-28 ENCOUNTER — Encounter: Payer: Self-pay | Admitting: *Deleted

## 2021-11-29 ENCOUNTER — Telehealth: Payer: Self-pay

## 2021-11-29 ENCOUNTER — Encounter: Payer: Self-pay | Admitting: Adult Health

## 2021-11-29 ENCOUNTER — Ambulatory Visit (INDEPENDENT_AMBULATORY_CARE_PROVIDER_SITE_OTHER): Payer: Medicare Other | Admitting: Adult Health

## 2021-11-29 VITALS — BP 117/59 | HR 71 | Ht 68.0 in | Wt 186.0 lb

## 2021-11-29 DIAGNOSIS — Z9989 Dependence on other enabling machines and devices: Secondary | ICD-10-CM | POA: Diagnosis not present

## 2021-11-29 DIAGNOSIS — M2021 Hallux rigidus, right foot: Secondary | ICD-10-CM | POA: Diagnosis not present

## 2021-11-29 DIAGNOSIS — L603 Nail dystrophy: Secondary | ICD-10-CM | POA: Diagnosis not present

## 2021-11-29 DIAGNOSIS — G4733 Obstructive sleep apnea (adult) (pediatric): Secondary | ICD-10-CM | POA: Diagnosis not present

## 2021-11-29 DIAGNOSIS — M2022 Hallux rigidus, left foot: Secondary | ICD-10-CM | POA: Diagnosis not present

## 2021-11-29 DIAGNOSIS — E1151 Type 2 diabetes mellitus with diabetic peripheral angiopathy without gangrene: Secondary | ICD-10-CM | POA: Diagnosis not present

## 2021-11-29 NOTE — Patient Instructions (Signed)
Continue using CPAP nightly and greater than 4 hours each night °If your symptoms worsen or you develop new symptoms please let us know.  ° °

## 2021-11-29 NOTE — Progress Notes (Signed)
Attempted to reach out to the patient to schedule appointment with CCM pharmacist . 3 attempts unsuccessful outreach ? ? ?Charlene Brooke, CPP notified ? ?Lamanda Rudder, CCMA ?Health concierge  ?[phone]  ?

## 2021-11-29 NOTE — Progress Notes (Signed)
? ? ?PATIENT: James Spurr, MD ?DOB: 19-Feb-1931 ? ?REASON FOR VISIT: follow up ?HISTORY FROM: patient ?PRIMARY NEUROLOGIST: Dr. Rexene Alberts ? ? ?HISTORY OF PRESENT ILLNESS: ?Today 11/29/21: ? 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  He returns today with a caregiver.  He is currently at [facility] for rehab.  It sounds as if he was in the hospital for aspiration pneumonia?  He reports that CPAP is doing well.  He does need new supplies ? ?11/15/20: ?HISTORY: James Bell is an 88 year old male with a history of obstructive sleep apnea on CPAP.  He reports that the CPAP is working well for him.  He is more concerned about his restless legs.  He states that he has been seeing Dr. Jeraldine Loots at [facility] and he was originally placed on oxycodone.  He cannot tolerate this medication as it caused altered mental status.  He is currently taking gabapentin 600 mg 4 times a day in addition to Mirapex 1 mg at bedtime.  The patient has been discussing with his PCP.  His PCP would like to decrease his dose of gabapentin due to kidney function.  The patient is asking for our input as well as he does not want to continue traveling to do.  He would like [name] to take over care of his diagnosis of restless legs.  He states that he has the most trouble at bedtime.  However during the day when his gabapentin dose is due he begins to have symptoms.  He returns today for an evaluation. ? ? ?REVIEW OF SYSTEMS: Out of a complete 14 system review of symptoms, the patient complains only of the following symptoms, and all other reviewed systems are negative. ? ? ?ESS 6 ? ?ALLERGIES: ?Allergies  ?Allergen Reactions  ? Dulaglutide   ?  Other reaction(s): upset stomach ?Other reaction(s): Abdominal Pain, upset stomach ?Other reaction(s): upset stomach ?  ? Empagliflozin Other (See Comments)  ?  Other reaction(s): yeast infection ?Other reaction(s): Other (See Comments) ?Other reaction(s): yeast infection ?  ?  Lisinopril Cough  ?  tachycardia  ? Nexium [Esomeprazole] Other (See Comments)  ?  Headache  ? Oxycodone Other (See Comments)  ?  Even 1/2 tablet of '5mg'$  caused increased confusion - contributory to hospitalization 10/2020 with AMS  ? Pregabalin Other (See Comments) and Nausea Only  ?  Other reaction(s): MALAISE, SLIGHT NAUSEA, SLIGHT HEADACHE ?Other reaction(s): MALAISE, SLIGHT NAUSEA, SLIGHT HEADACHE ?  ? Rosuvastatin Other (See Comments)  ?  Other reaction(s): muscle pain ?2.16.2022 Pt is current on this medication without any issues. ?Other reaction(s): muscle pain ?2.16.2022 Pt is current on this medication without any issues. ?Other reaction(s): muscle pain  ? ? ?HOME MEDICATIONS: ?Outpatient Medications Prior to Visit  ?Medication Sig Dispense Refill  ? acetaminophen (TYLENOL) 325 MG tablet Take 2 tablets (650 mg total) by mouth every 6 (six) hours as needed for mild pain (or Fever >/= 101).    ? Alpha-Lipoic Acid 600 MG CAPS Take 1 capsule (600 mg total) by mouth daily. 30 capsule 11  ? clopidogrel (PLAVIX) 75 MG tablet TAKE 1 TABLET DAILY 90 tablet 3  ? Coenzyme Q10 (COQ10) 150 MG CAPS Take 1 capsule by mouth daily.    ? DULoxetine (CYMBALTA) 60 MG capsule Take 1 capsule (60 mg total) by mouth daily. 90 capsule 3  ? famotidine (PEPCID) 20 MG tablet Take 1 tablet (20 mg total) by mouth at bedtime.    ?  gabapentin (NEURONTIN) 300 MG capsule Take 2 capsules (600 mg total) by mouth 3 (three) times daily. 600 mg BID is his minimum dose without having breakthrough symptoms. He is aware 900 mg/day is recommended per kidney function.    ? insulin aspart (NOVOLOG) 100 UNIT/ML injection CBG 70 - 120: 0 units  ?CBG 121 - 150: 1 unit  ?CBG 151 - 200: 2 units  ?CBG 201 - 250: 3 units ? CBG 251 - 300: 5 units  ?CBG 301 - 350: 7 units  ?CBG 351 - 400: 9 units 10 mL 11  ? insulin glargine (LANTUS) 100 UNIT/ML injection Inject 0.2 mLs (20 Units total) into the skin at bedtime. 10 mL 11  ? Insulin Pen Needle (ULTICARE MICRO  PEN NEEDLES) 31G X 8 MM MISC Use to inject medication daily 100 each 3  ? levothyroxine (SYNTHROID) 50 MCG tablet Take 1 tablet (50 mcg total) by mouth daily before breakfast. Per endo [name]    ? melatonin 5 MG TABS Take 5 mg by mouth at bedtime.    ? nitroGLYCERIN (NITROSTAT) 0.4 MG SL tablet DISSOLVE 1 TABLET UNDER THE TONGUE EVERY 5 MINUTES AS NEEDED FOR CHEST PAIN 25 tablet 1  ? ondansetron (ZOFRAN) 4 MG tablet Take 1 tablet (4 mg total) by mouth every 8 (eight) hours as needed for nausea or vomiting. 30 tablet 0  ? pantoprazole (PROTONIX) 40 MG tablet Take 40 mg by mouth daily.    ? polyethylene glycol (MIRALAX / GLYCOLAX) 17 g packet Take 17 g by mouth daily as needed. 14 each 0  ? pramipexole (MIRAPEX) 1 MG tablet Take 1 tablet (1 mg total) by mouth daily. At 4pm 90 tablet 3  ? sodium chloride (MURO 128) 5 % ophthalmic solution 1 drop as needed for eye irritation.    ? tamsulosin (FLOMAX) 0.4 MG CAPS capsule Take 1 capsule (0.4 mg total) by mouth daily after breakfast. 90 capsule 3  ? vitamin C (ASCORBIC ACID) 500 MG tablet Take 1,000 mg by mouth daily as needed.    ? ?No facility-administered medications prior to visit.  ? ? ?PAST MEDICAL HISTORY: ?Past Medical History:  ?Diagnosis Date  ? Anemia   ? Basal cell carcinoma 2018  ? R temple   ? Basal cell carcinoma of nose 2002  ? bridge of nose  ? BPH with obstruction/lower urinary tract symptoms   ? failed flomax, uroxatral, myrbetriq - sees urology [name]) pending videourodynamics  ? CKD (chronic kidney disease) stage 3, GFR 30-59 ml/min (HCC) 03/11/2012  ? Renal US 02/2014 - multiple kidney cysts   ? CVA (cerebral infarction) 03/07/2012  ? slurred speech; right hemplegia, left handed - completed PT 07/2014 (17 sessions)  ? Dehydration   ? Depression   ? mild, on cymbalta per prior PCP  ? Erectile dysfunction   ? per urology (prostaglandin injection)  ? GERD (gastroesophageal reflux disease)   ? with sliding HH  ? H/O hiatal hernia   ? History of  kidney problems 12/2010  ? lacerated left kidney after fall  ? HLD (hyperlipidemia)   ? Hx of basal cell carcinoma 07/10/2017  ? R. lat. forehead near hair line  ? Kidney cysts 02/2014  ? bilateral (renal US)  ? Migraines 03/11/2012  ? now rare  ? OSA on CPAP   ? sleep study 01/2014 - severe, CPAP at 10, previously on nuvigil (Dr. [name])  ? Palpitations 01/2012  ? holter WNL, rare PAC/PVCs  ? Presbycusis of  both ears 2016  ? rec amplification [facility])  ? Restless leg syndrome   ? well controlled on mirapex  ? Rosacea   ? [name]  ? Stroke [facility])   ? TIA (transient ischemic attack)   ? Type 2 diabetes, controlled, with neuropathy ([place]) 2011  ? [name] in [place]  ? ? ?PAST SURGICAL HISTORY: ?Past Surgical History:  ?Procedure Laterality Date  ? ABI  2007  ? WNL  ? CARDIAC CATHETERIZATION  2004  ? normal; [place]  ? CARDIOVASCULAR STRESS TEST  02/2014  ? WNL, low risk scan, EF 68%  ? CAROTID ENDARTERECTOMY    ? CATARACT EXTRACTION W/ INTRAOCULAR LENS IMPLANT  11//2012 - 08/2011  ? COLONOSCOPY  03/2011  ? poor prep, unable to biopsy polyp in tic fold, rpt 3 mo [name])  ? COLONOSCOPY  06/2011  ? mult polyps adenomatous, severe diverticulosis, rpt 3 yrs [name])  ? CYSTOSCOPY WITH URETHRAL DILATATION N/A 07/24/2013  ? Procedure: CYSTOSCOPY WITH URETHRAL DILATATION;  Surgeon: [name], MD;  Location: [facility];  Service: Urology;  Laterality: N/A;  ? ESOPHAGOGASTRODUODENOSCOPY  2006  ? duodenitis, medual HH, Grade 2 reflux, mild gastritis  ? ESOPHAGOGASTRODUODENOSCOPY  07/2011  ? mild chronic gastritis  ? [place]  ? [place]; 2002  ? left, then repaired  ? MRI brain  04/2010  ? WNL  ? PROSTATE SURGERY  02/2009  ? PVP (laser)  ? SKIN CANCER EXCISION  2002  ? bridge of nose, BCC  ? TONSILLECTOMY AND ADENOIDECTOMY  ~ 1945  ? TRANSURETHRAL INCISION OF PROSTATE N/A 07/24/2013  ? Procedure: TRANSURETHRAL INCISION OF THE PROSTATE (TUIP);  Surgeon: [name], MD;  Location:  [facility];  Service: Urology;  Laterality: N/A;  ? URETHRAL DILATION  2014  ? [place]  ? ? ?FAMILY HISTORY: ?Family History  ?Problem Relation Age of Onset  ? Stroke Father   ? Lung cancer Father 76  ?

## 2021-11-30 DIAGNOSIS — G629 Polyneuropathy, unspecified: Secondary | ICD-10-CM | POA: Diagnosis not present

## 2021-11-30 DIAGNOSIS — G4700 Insomnia, unspecified: Secondary | ICD-10-CM | POA: Diagnosis not present

## 2021-11-30 DIAGNOSIS — I639 Cerebral infarction, unspecified: Secondary | ICD-10-CM | POA: Diagnosis not present

## 2021-11-30 DIAGNOSIS — E1169 Type 2 diabetes mellitus with other specified complication: Secondary | ICD-10-CM | POA: Diagnosis not present

## 2021-11-30 DIAGNOSIS — R1310 Dysphagia, unspecified: Secondary | ICD-10-CM | POA: Diagnosis not present

## 2021-11-30 DIAGNOSIS — E039 Hypothyroidism, unspecified: Secondary | ICD-10-CM | POA: Diagnosis not present

## 2021-11-30 DIAGNOSIS — K219 Gastro-esophageal reflux disease without esophagitis: Secondary | ICD-10-CM | POA: Diagnosis not present

## 2021-11-30 DIAGNOSIS — J690 Pneumonitis due to inhalation of food and vomit: Secondary | ICD-10-CM | POA: Diagnosis not present

## 2021-12-01 DIAGNOSIS — M6281 Muscle weakness (generalized): Secondary | ICD-10-CM | POA: Diagnosis not present

## 2021-12-01 DIAGNOSIS — E785 Hyperlipidemia, unspecified: Secondary | ICD-10-CM | POA: Diagnosis not present

## 2021-12-01 DIAGNOSIS — Z794 Long term (current) use of insulin: Secondary | ICD-10-CM | POA: Diagnosis not present

## 2021-12-01 DIAGNOSIS — E039 Hypothyroidism, unspecified: Secondary | ICD-10-CM | POA: Diagnosis not present

## 2021-12-01 DIAGNOSIS — R609 Edema, unspecified: Secondary | ICD-10-CM | POA: Diagnosis not present

## 2021-12-01 DIAGNOSIS — I6930 Unspecified sequelae of cerebral infarction: Secondary | ICD-10-CM | POA: Diagnosis not present

## 2021-12-01 DIAGNOSIS — E1142 Type 2 diabetes mellitus with diabetic polyneuropathy: Secondary | ICD-10-CM | POA: Diagnosis not present

## 2021-12-02 NOTE — Progress Notes (Signed)
Denyse Amass, RN; Vanessa Ralphs ?got it!   ? ?  ?Previous Messages ?  ?----- Message -----  ?From: Brandon Melnick, RN  ?Sent: 11/29/2021  11:21 AM EDT  ?To: [name]  ?Subject: cpap supplies                                  ? ?Good Morning. New orders in Epic for pt.  ?James Bell "[name]"  ?Male, 88 y.o., 1931/04/04  ?MRN:  ?[id number]  ?Needs new supplies, currently resides at [place].    ? ?Acupuncturist  ? ?

## 2021-12-06 ENCOUNTER — Ambulatory Visit: Payer: Medicare Other | Admitting: Family Medicine

## 2021-12-07 DIAGNOSIS — G4733 Obstructive sleep apnea (adult) (pediatric): Secondary | ICD-10-CM | POA: Diagnosis not present

## 2021-12-09 DIAGNOSIS — I129 Hypertensive chronic kidney disease with stage 1 through stage 4 chronic kidney disease, or unspecified chronic kidney disease: Secondary | ICD-10-CM | POA: Diagnosis not present

## 2021-12-09 DIAGNOSIS — I69351 Hemiplegia and hemiparesis following cerebral infarction affecting right dominant side: Secondary | ICD-10-CM | POA: Diagnosis not present

## 2021-12-09 DIAGNOSIS — K219 Gastro-esophageal reflux disease without esophagitis: Secondary | ICD-10-CM | POA: Diagnosis not present

## 2021-12-09 DIAGNOSIS — Z87440 Personal history of urinary (tract) infections: Secondary | ICD-10-CM | POA: Diagnosis not present

## 2021-12-09 DIAGNOSIS — K5900 Constipation, unspecified: Secondary | ICD-10-CM | POA: Diagnosis not present

## 2021-12-09 DIAGNOSIS — N1831 Chronic kidney disease, stage 3a: Secondary | ICD-10-CM | POA: Diagnosis not present

## 2021-12-09 DIAGNOSIS — Z7902 Long term (current) use of antithrombotics/antiplatelets: Secondary | ICD-10-CM | POA: Diagnosis not present

## 2021-12-09 DIAGNOSIS — E1122 Type 2 diabetes mellitus with diabetic chronic kidney disease: Secondary | ICD-10-CM | POA: Diagnosis not present

## 2021-12-09 DIAGNOSIS — G8929 Other chronic pain: Secondary | ICD-10-CM | POA: Diagnosis not present

## 2021-12-09 DIAGNOSIS — Z794 Long term (current) use of insulin: Secondary | ICD-10-CM | POA: Diagnosis not present

## 2021-12-09 DIAGNOSIS — Z7985 Long-term (current) use of injectable non-insulin antidiabetic drugs: Secondary | ICD-10-CM | POA: Diagnosis not present

## 2021-12-09 DIAGNOSIS — E785 Hyperlipidemia, unspecified: Secondary | ICD-10-CM | POA: Diagnosis not present

## 2021-12-09 DIAGNOSIS — R339 Retention of urine, unspecified: Secondary | ICD-10-CM | POA: Diagnosis not present

## 2021-12-09 DIAGNOSIS — G2581 Restless legs syndrome: Secondary | ICD-10-CM | POA: Diagnosis not present

## 2021-12-09 DIAGNOSIS — E039 Hypothyroidism, unspecified: Secondary | ICD-10-CM | POA: Diagnosis not present

## 2021-12-09 DIAGNOSIS — E1140 Type 2 diabetes mellitus with diabetic neuropathy, unspecified: Secondary | ICD-10-CM | POA: Diagnosis not present

## 2021-12-09 DIAGNOSIS — G479 Sleep disorder, unspecified: Secondary | ICD-10-CM | POA: Diagnosis not present

## 2021-12-12 ENCOUNTER — Telehealth: Payer: Self-pay | Admitting: Family Medicine

## 2021-12-12 NOTE — Telephone Encounter (Signed)
James Bell, [place] ?[phone]   ? ?Skilled nursing request: ? ?1 wk 1 ?2 wk 2 ?1wk 1 ?3  mos 1 ?2 PRNs ? ?Reason: Disease management and medications ?

## 2021-12-12 NOTE — Telephone Encounter (Signed)
Agree with these verbal orders thanks.  

## 2021-12-12 NOTE — Telephone Encounter (Signed)
Olin Hauser notified as instructed by telephone and verbalized understanding. ?

## 2021-12-13 ENCOUNTER — Ambulatory Visit (INDEPENDENT_AMBULATORY_CARE_PROVIDER_SITE_OTHER): Payer: Medicare Other | Admitting: Family Medicine

## 2021-12-13 ENCOUNTER — Encounter: Payer: Self-pay | Admitting: Family Medicine

## 2021-12-13 ENCOUNTER — Other Ambulatory Visit: Payer: Self-pay

## 2021-12-13 VITALS — BP 130/76 | HR 86 | Temp 97.3°F | Ht 68.0 in | Wt 188.4 lb

## 2021-12-13 DIAGNOSIS — N1831 Chronic kidney disease, stage 3a: Secondary | ICD-10-CM | POA: Diagnosis not present

## 2021-12-13 DIAGNOSIS — E1165 Type 2 diabetes mellitus with hyperglycemia: Secondary | ICD-10-CM

## 2021-12-13 DIAGNOSIS — Z794 Long term (current) use of insulin: Secondary | ICD-10-CM | POA: Diagnosis not present

## 2021-12-13 DIAGNOSIS — A0472 Enterocolitis due to Clostridium difficile, not specified as recurrent: Secondary | ICD-10-CM

## 2021-12-13 DIAGNOSIS — E785 Hyperlipidemia, unspecified: Secondary | ICD-10-CM | POA: Diagnosis not present

## 2021-12-13 DIAGNOSIS — K219 Gastro-esophageal reflux disease without esophagitis: Secondary | ICD-10-CM

## 2021-12-13 DIAGNOSIS — E1140 Type 2 diabetes mellitus with diabetic neuropathy, unspecified: Secondary | ICD-10-CM | POA: Diagnosis not present

## 2021-12-13 DIAGNOSIS — G8110 Spastic hemiplegia affecting unspecified side: Secondary | ICD-10-CM | POA: Diagnosis not present

## 2021-12-13 DIAGNOSIS — E039 Hypothyroidism, unspecified: Secondary | ICD-10-CM | POA: Diagnosis not present

## 2021-12-13 DIAGNOSIS — G8929 Other chronic pain: Secondary | ICD-10-CM | POA: Diagnosis not present

## 2021-12-13 DIAGNOSIS — I129 Hypertensive chronic kidney disease with stage 1 through stage 4 chronic kidney disease, or unspecified chronic kidney disease: Secondary | ICD-10-CM | POA: Diagnosis not present

## 2021-12-13 DIAGNOSIS — Z7902 Long term (current) use of antithrombotics/antiplatelets: Secondary | ICD-10-CM | POA: Diagnosis not present

## 2021-12-13 DIAGNOSIS — R339 Retention of urine, unspecified: Secondary | ICD-10-CM | POA: Diagnosis not present

## 2021-12-13 DIAGNOSIS — Z87440 Personal history of urinary (tract) infections: Secondary | ICD-10-CM | POA: Diagnosis not present

## 2021-12-13 DIAGNOSIS — G479 Sleep disorder, unspecified: Secondary | ICD-10-CM | POA: Diagnosis not present

## 2021-12-13 DIAGNOSIS — K5900 Constipation, unspecified: Secondary | ICD-10-CM | POA: Diagnosis not present

## 2021-12-13 DIAGNOSIS — G4733 Obstructive sleep apnea (adult) (pediatric): Secondary | ICD-10-CM | POA: Diagnosis not present

## 2021-12-13 DIAGNOSIS — G2581 Restless legs syndrome: Secondary | ICD-10-CM | POA: Diagnosis not present

## 2021-12-13 DIAGNOSIS — E1122 Type 2 diabetes mellitus with diabetic chronic kidney disease: Secondary | ICD-10-CM | POA: Diagnosis not present

## 2021-12-13 DIAGNOSIS — Z9989 Dependence on other enabling machines and devices: Secondary | ICD-10-CM | POA: Diagnosis not present

## 2021-12-13 DIAGNOSIS — Z7985 Long-term (current) use of injectable non-insulin antidiabetic drugs: Secondary | ICD-10-CM | POA: Diagnosis not present

## 2021-12-13 DIAGNOSIS — J690 Pneumonitis due to inhalation of food and vomit: Secondary | ICD-10-CM

## 2021-12-13 DIAGNOSIS — I69351 Hemiplegia and hemiparesis following cerebral infarction affecting right dominant side: Secondary | ICD-10-CM | POA: Diagnosis not present

## 2021-12-13 MED ORDER — ONDANSETRON HCL 4 MG PO TABS: 4.0000 mg | ORAL_TABLET | Freq: Three times a day (TID) | ORAL | 0 refills | Status: DC | PRN

## 2021-12-13 MED ORDER — RABEPRAZOLE SODIUM 20 MG PO TBEC: 20.0000 mg | DELAYED_RELEASE_TABLET | Freq: Every day | ORAL | 3 refills | Status: DC

## 2021-12-13 NOTE — Patient Instructions (Addendum)
I'm glad you've recovered well . ?Continue current medicines ?I have sent in aciphex to use in place of pantoprazole.  ?Go ahead and start probiotic you have at home.  ?Good to see you today. ?Return as needed or in 6 months for physical/wellness visit.  ?

## 2021-12-13 NOTE — Progress Notes (Addendum)
? ? Patient ID: James Spurr, MD, male    DOB: [birth year], 88 y.o.   MRN: [id number] ? ?This visit was conducted in person. ? ?BP 130/76   Pulse 86   Temp (!) 97.3 ?F (36.3 ?C) (Temporal)   Ht '5\' 8"'$  (1.727 m)   Wt 188 lb 6 oz (85.4 kg)   SpO2 (!) 6%   BMI 28.64 kg/m?   ? ?CC: hosp f/u visit  ?Subjective:  ? 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  ) ? ? ?Recent hospitalization 10/2020 for postural dizziness with syncope in setting of C diff vs Enterobacter urinary infection treated with vancomycin. Also had cellulitis of L heel that began as abrasion. Subsequently hospitalized 11/2021 with aspiration pneumonia after choking on pill followed by nausea/vomiting and cough. Treated with IV Unasyn 3d course, transitioned to oral augmentin 5d course. SLP eval - recommended regular consistency diet with thin liquids. CT abd/pelvis negative for acute process.  ?Hospital records reviewed. Med rec performed. Aciphex and celebrex were stopped. He continues taking pantoprazole and pepcid. Feels Aciphex is more effective - will refill this. ?He was discharged to SNF.  ?He was discharged home from rehab last week.  ? ?Home health set up - [facility].  ?Other follow up appointments scheduled: SN/PT/OT.  ? ?OSA on CPAP - recently saw neurology with good compliance.  ?Notes alternating diarrhea/constipation without significant watery stools or blood in stool. Stools have never really normalized since last month.  ?Has not been using probiotic.  ?______________________________________________________________________ ?Hospital admission: 11/21/2021 ?Hospital discharge: 11/24/2021 ?TCM f/u phone call: not performed  ? ?Discharge Diagnoses:  ?Principal Problem: ?  Aspiration pneumonia ([place]) ?Active Problems: ?  Choking episode ?  Nausea and vomiting ?  Generalized weakness ?  Type 2 diabetes  mellitus with hyperglycemia (HCC) ?  Chronic kidney disease, stage 3a ([place]) ?  Hypomagnesemia ?   ? ?Relevant past medical, surgical, family and social history reviewed and updated as indicated. Interim medical history since our last visit reviewed. ?Allergies and medications reviewed and updated. ?Outpatient Medications Prior to Visit  ?Medication Sig Dispense Refill  ? acetaminophen (TYLENOL) 325 MG tablet Take 2 tablets (650 mg total) by mouth every 6 (six) hours as needed for mild pain (or Fever >/= 101).    ? Alpha-Lipoic Acid 600 MG CAPS Take 1 capsule (600 mg total) by mouth daily. 30 capsule 11  ? clopidogrel (PLAVIX) 75 MG tablet TAKE 1 TABLET DAILY 90 tablet 3  ? Coenzyme Q10 (COQ10) 150 MG CAPS Take 1 capsule by mouth daily.    ? DULoxetine (CYMBALTA) 60 MG capsule Take 1 capsule (60 mg total) by mouth daily. 90 capsule 3  ? famotidine (PEPCID) 20 MG tablet Take 1 tablet (20 mg total) by mouth at bedtime.    ? gabapentin (NEURONTIN) 300 MG capsule Take 2 capsules (600 mg total) by mouth 3 (three) times daily. 600 mg BID is his minimum dose without having breakthrough symptoms. He is aware 900 mg/day is recommended per kidney function.    ? insulin aspart (NOVOLOG) 100 UNIT/ML injection CBG 70 - 120: 0 units  ?CBG 121 - 150: 1 unit  ?CBG 151 - 200: 2 units  ?CBG 201 - 250: 3 units ? CBG 251 - 300: 5 units  ?CBG 301 - 350: 7 units  ?CBG 351 - 400: 9 units 10 mL 11  ?  insulin glargine (LANTUS) 100 UNIT/ML injection Inject 0.2 mLs (20 Units total) into the skin at bedtime. (Patient taking differently: Inject 20 Units into the skin at bedtime. Injects 30 units daily at bedtime) 10 mL 11  ? Insulin Pen Needle (ULTICARE MICRO PEN NEEDLES) 31G X 8 MM MISC Use to inject medication daily 100 each 3  ? levothyroxine (SYNTHROID) 50 MCG tablet Take 1 tablet (50 mcg total) by mouth daily before breakfast. Per endo [name]    ? melatonin 5 MG TABS Take 5 mg by mouth at bedtime.    ? nitroGLYCERIN (NITROSTAT) 0.4 MG SL  tablet DISSOLVE 1 TABLET UNDER THE TONGUE EVERY 5 MINUTES AS NEEDED FOR CHEST PAIN 25 tablet 1  ? polyethylene glycol (MIRALAX / GLYCOLAX) 17 g packet Take 17 g by mouth daily as needed. 14 each 0  ? pramipexole (MIRAPEX) 1 MG tablet Take 1 tablet (1 mg total) by mouth daily. At 4pm 90 tablet 3  ? sodium chloride (MURO 128) 5 % ophthalmic solution 1 drop as needed for eye irritation.    ? vitamin C (ASCORBIC ACID) 500 MG tablet Take 1,000 mg by mouth daily as needed.    ? ondansetron (ZOFRAN) 4 MG tablet Take 1 tablet (4 mg total) by mouth every 8 (eight) hours as needed for nausea or vomiting. 30 tablet 0  ? pantoprazole (PROTONIX) 40 MG tablet Take 40 mg by mouth daily.    ? tamsulosin (FLOMAX) 0.4 MG CAPS capsule Take 1 capsule (0.4 mg total) by mouth daily after breakfast. (Patient not taking: Reported on 12/13/2021) 90 capsule 3  ? ?No facility-administered medications prior to visit.  ?  ? ?Per HPI unless specifically indicated in ROS section below ?Review of Systems ? ?Objective:  ?BP 130/76   Pulse 86   Temp (!) 97.3 ?F (36.3 ?C) (Temporal)   Ht '5\' 8"'$  (1.727 m)   Wt 188 lb 6 oz (85.4 kg)   SpO2 (!) 6%   BMI 28.64 kg/m?   ?Wt Readings from Last 3 Encounters:  ?12/13/21 188 lb 6 oz (85.4 kg)  ?11/29/21 186 lb (84.4 kg)  ?11/21/21 194 lb 0.1 oz (88 kg)  ?  ?  ?Physical Exam ?Vitals and nursing note reviewed.  ?Constitutional:   ?   Appearance: Normal appearance. He is not ill-appearing.  ?Cardiovascular:  ?   Rate and Rhythm: Normal rate and regular rhythm.  ?   Pulses: Normal pulses.  ?   Heart sounds: Normal heart sounds. No murmur heard. ?Pulmonary:  ?   Effort: Pulmonary effort is normal. No respiratory distress.  ?   Breath sounds: Normal breath sounds. No wheezing, rhonchi or rales.  ?Musculoskeletal:  ?   Right lower leg: No edema.  ?   Left lower leg: No edema.  ?Skin: ?   General: Skin is warm and dry.  ?   Findings: No rash.  ?Neurological:  ?   Mental Status: He is alert.  ?   Comments:   ?Residual R hemiparesis. ?Ambulates with rollator  ?Psychiatric:     ?   Mood and Affect: Mood normal.     ?   Behavior: Behavior normal.  ? ?   ?Lab Results  ?Component Value Date  ? HGBA1C 7.2 (H) 10/25/2021  ?  ?Lab Results  ?Component Value Date  ? CREATININE 1.26 (H) 11/24/2021  ? BUN 16 11/24/2021  ? NA 137 11/24/2021  ? K 4.1 11/24/2021  ? CL 104 11/24/2021  ? CO2  26 11/24/2021  ? ?Assessment & Plan:  ?This visit occurred during the SARS-CoV-2 public health emergency.  Safety protocols were in place, including screening questions prior to the visit, additional usage of staff PPE, and extensive cleaning of exam room while observing appropriate contact time as indicated for disinfecting solutions.  ? ?Problem List Items Addressed This Visit   ? ? Uncontrolled type 2 diabetes mellitus with hyperglycemia, with long-term current use of insulin ([place])  ?  Latest A1c showing good control.  This is followed by Endo Dr. [name].  Continue Lantus 20 units daily at bedtime. ?  ?  ? OSA on CPAP  ?  He just saw neurology, note reviewed.  Continue CPAP nightly ?  ?  ? GERD (gastroesophageal reflux disease)  ?  It seems he was placed on pantoprazole during latest hospitalization however his home regimen is Aciphex which he finds more effective.  Will restart Aciphex. ?  ?  ? Relevant Medications  ? ondansetron (ZOFRAN) 4 MG tablet  ? RABEprazole (ACIPHEX) 20 MG tablet  ? Spastic hemiparesis affecting dominant side ([place])  ? C. difficile colitis  ?  This was treated during hospitalization in February with course of vancomycin. ?He notes ongoing intermittent alternating diarrhea and constipation.  Will continue to monitor. ?I did recommend he start a probiotic and if ongoing diarrhea to let me know to repeat testing. ?  ?  ? Aspiration pneumonia ([place]) - Primary  ?  He has completed an outpatient course of oral augmentin without ongoing symptoms.  ?He was evaluated by speech therapy in the hospital - okay to continue regular  consistency diet with thin liquids. ?  ?  ?  ? ?Meds ordered this encounter  ?Medications  ? ondansetron (ZOFRAN) 4 MG tablet  ?  Sig: Take 1 tablet (4 mg total) by mouth every 8 (eight) hours as needed for nausea or vomiting

## 2021-12-14 NOTE — Assessment & Plan Note (Addendum)
This was treated during hospitalization in February with course of vancomycin. ?He notes ongoing intermittent alternating diarrhea and constipation.  Will continue to monitor. ?I did recommend he start a probiotic and if ongoing diarrhea to let me know to repeat testing. ?

## 2021-12-14 NOTE — Assessment & Plan Note (Signed)
Latest A1c showing good control.  This is followed by Endo Dr. [name].  Continue Lantus 20 units daily at bedtime. ?

## 2021-12-14 NOTE — Assessment & Plan Note (Addendum)
It seems he was placed on pantoprazole during latest hospitalization however his home regimen is Aciphex which he finds more effective.  Will restart Aciphex. ?

## 2021-12-14 NOTE — Assessment & Plan Note (Signed)
He just saw neurology, note reviewed.  Continue CPAP nightly ?

## 2021-12-14 NOTE — Assessment & Plan Note (Signed)
He has completed an outpatient course of oral augmentin without ongoing symptoms.  ?He was evaluated by speech therapy in the hospital - okay to continue regular consistency diet with thin liquids. ?

## 2021-12-16 DIAGNOSIS — I129 Hypertensive chronic kidney disease with stage 1 through stage 4 chronic kidney disease, or unspecified chronic kidney disease: Secondary | ICD-10-CM | POA: Diagnosis not present

## 2021-12-16 DIAGNOSIS — E1122 Type 2 diabetes mellitus with diabetic chronic kidney disease: Secondary | ICD-10-CM | POA: Diagnosis not present

## 2021-12-16 DIAGNOSIS — R339 Retention of urine, unspecified: Secondary | ICD-10-CM | POA: Diagnosis not present

## 2021-12-16 DIAGNOSIS — Z87440 Personal history of urinary (tract) infections: Secondary | ICD-10-CM | POA: Diagnosis not present

## 2021-12-16 DIAGNOSIS — E785 Hyperlipidemia, unspecified: Secondary | ICD-10-CM | POA: Diagnosis not present

## 2021-12-16 DIAGNOSIS — I69351 Hemiplegia and hemiparesis following cerebral infarction affecting right dominant side: Secondary | ICD-10-CM | POA: Diagnosis not present

## 2021-12-16 DIAGNOSIS — G2581 Restless legs syndrome: Secondary | ICD-10-CM | POA: Diagnosis not present

## 2021-12-16 DIAGNOSIS — N1831 Chronic kidney disease, stage 3a: Secondary | ICD-10-CM | POA: Diagnosis not present

## 2021-12-16 DIAGNOSIS — Z7902 Long term (current) use of antithrombotics/antiplatelets: Secondary | ICD-10-CM | POA: Diagnosis not present

## 2021-12-16 DIAGNOSIS — E039 Hypothyroidism, unspecified: Secondary | ICD-10-CM | POA: Diagnosis not present

## 2021-12-16 DIAGNOSIS — K5900 Constipation, unspecified: Secondary | ICD-10-CM | POA: Diagnosis not present

## 2021-12-16 DIAGNOSIS — Z7985 Long-term (current) use of injectable non-insulin antidiabetic drugs: Secondary | ICD-10-CM | POA: Diagnosis not present

## 2021-12-16 DIAGNOSIS — K219 Gastro-esophageal reflux disease without esophagitis: Secondary | ICD-10-CM | POA: Diagnosis not present

## 2021-12-16 DIAGNOSIS — Z794 Long term (current) use of insulin: Secondary | ICD-10-CM | POA: Diagnosis not present

## 2021-12-16 DIAGNOSIS — E1140 Type 2 diabetes mellitus with diabetic neuropathy, unspecified: Secondary | ICD-10-CM | POA: Diagnosis not present

## 2021-12-16 DIAGNOSIS — G8929 Other chronic pain: Secondary | ICD-10-CM | POA: Diagnosis not present

## 2021-12-16 DIAGNOSIS — G479 Sleep disorder, unspecified: Secondary | ICD-10-CM | POA: Diagnosis not present

## 2021-12-19 DIAGNOSIS — E039 Hypothyroidism, unspecified: Secondary | ICD-10-CM

## 2021-12-19 DIAGNOSIS — R339 Retention of urine, unspecified: Secondary | ICD-10-CM

## 2021-12-19 DIAGNOSIS — K5900 Constipation, unspecified: Secondary | ICD-10-CM

## 2021-12-19 DIAGNOSIS — Z87440 Personal history of urinary (tract) infections: Secondary | ICD-10-CM

## 2021-12-19 DIAGNOSIS — Z794 Long term (current) use of insulin: Secondary | ICD-10-CM

## 2021-12-19 DIAGNOSIS — N1831 Chronic kidney disease, stage 3a: Secondary | ICD-10-CM

## 2021-12-19 DIAGNOSIS — G479 Sleep disorder, unspecified: Secondary | ICD-10-CM

## 2021-12-19 DIAGNOSIS — I69351 Hemiplegia and hemiparesis following cerebral infarction affecting right dominant side: Secondary | ICD-10-CM

## 2021-12-19 DIAGNOSIS — E785 Hyperlipidemia, unspecified: Secondary | ICD-10-CM

## 2021-12-19 DIAGNOSIS — G8929 Other chronic pain: Secondary | ICD-10-CM

## 2021-12-19 DIAGNOSIS — I129 Hypertensive chronic kidney disease with stage 1 through stage 4 chronic kidney disease, or unspecified chronic kidney disease: Secondary | ICD-10-CM

## 2021-12-19 DIAGNOSIS — K219 Gastro-esophageal reflux disease without esophagitis: Secondary | ICD-10-CM

## 2021-12-19 DIAGNOSIS — Z7902 Long term (current) use of antithrombotics/antiplatelets: Secondary | ICD-10-CM

## 2021-12-19 DIAGNOSIS — E1122 Type 2 diabetes mellitus with diabetic chronic kidney disease: Secondary | ICD-10-CM

## 2021-12-19 DIAGNOSIS — Z7985 Long-term (current) use of injectable non-insulin antidiabetic drugs: Secondary | ICD-10-CM

## 2021-12-19 DIAGNOSIS — J189 Pneumonia, unspecified organism: Secondary | ICD-10-CM

## 2021-12-19 DIAGNOSIS — E1140 Type 2 diabetes mellitus with diabetic neuropathy, unspecified: Secondary | ICD-10-CM

## 2021-12-19 DIAGNOSIS — G2581 Restless legs syndrome: Secondary | ICD-10-CM

## 2021-12-20 DIAGNOSIS — I129 Hypertensive chronic kidney disease with stage 1 through stage 4 chronic kidney disease, or unspecified chronic kidney disease: Secondary | ICD-10-CM | POA: Diagnosis not present

## 2021-12-20 DIAGNOSIS — G479 Sleep disorder, unspecified: Secondary | ICD-10-CM | POA: Diagnosis not present

## 2021-12-20 DIAGNOSIS — K219 Gastro-esophageal reflux disease without esophagitis: Secondary | ICD-10-CM | POA: Diagnosis not present

## 2021-12-20 DIAGNOSIS — Z7902 Long term (current) use of antithrombotics/antiplatelets: Secondary | ICD-10-CM | POA: Diagnosis not present

## 2021-12-20 DIAGNOSIS — E1122 Type 2 diabetes mellitus with diabetic chronic kidney disease: Secondary | ICD-10-CM | POA: Diagnosis not present

## 2021-12-20 DIAGNOSIS — G2581 Restless legs syndrome: Secondary | ICD-10-CM | POA: Diagnosis not present

## 2021-12-20 DIAGNOSIS — E039 Hypothyroidism, unspecified: Secondary | ICD-10-CM | POA: Diagnosis not present

## 2021-12-20 DIAGNOSIS — Z794 Long term (current) use of insulin: Secondary | ICD-10-CM | POA: Diagnosis not present

## 2021-12-20 DIAGNOSIS — E1140 Type 2 diabetes mellitus with diabetic neuropathy, unspecified: Secondary | ICD-10-CM | POA: Diagnosis not present

## 2021-12-20 DIAGNOSIS — K5900 Constipation, unspecified: Secondary | ICD-10-CM | POA: Diagnosis not present

## 2021-12-20 DIAGNOSIS — N1831 Chronic kidney disease, stage 3a: Secondary | ICD-10-CM | POA: Diagnosis not present

## 2021-12-20 DIAGNOSIS — G8929 Other chronic pain: Secondary | ICD-10-CM | POA: Diagnosis not present

## 2021-12-20 DIAGNOSIS — R339 Retention of urine, unspecified: Secondary | ICD-10-CM | POA: Diagnosis not present

## 2021-12-20 DIAGNOSIS — Z87440 Personal history of urinary (tract) infections: Secondary | ICD-10-CM | POA: Diagnosis not present

## 2021-12-20 DIAGNOSIS — E785 Hyperlipidemia, unspecified: Secondary | ICD-10-CM | POA: Diagnosis not present

## 2021-12-20 DIAGNOSIS — I69351 Hemiplegia and hemiparesis following cerebral infarction affecting right dominant side: Secondary | ICD-10-CM | POA: Diagnosis not present

## 2021-12-20 DIAGNOSIS — Z7985 Long-term (current) use of injectable non-insulin antidiabetic drugs: Secondary | ICD-10-CM | POA: Diagnosis not present

## 2021-12-22 DIAGNOSIS — K219 Gastro-esophageal reflux disease without esophagitis: Secondary | ICD-10-CM | POA: Diagnosis not present

## 2021-12-22 DIAGNOSIS — I129 Hypertensive chronic kidney disease with stage 1 through stage 4 chronic kidney disease, or unspecified chronic kidney disease: Secondary | ICD-10-CM | POA: Diagnosis not present

## 2021-12-22 DIAGNOSIS — Z7902 Long term (current) use of antithrombotics/antiplatelets: Secondary | ICD-10-CM | POA: Diagnosis not present

## 2021-12-22 DIAGNOSIS — E785 Hyperlipidemia, unspecified: Secondary | ICD-10-CM | POA: Diagnosis not present

## 2021-12-22 DIAGNOSIS — G8929 Other chronic pain: Secondary | ICD-10-CM | POA: Diagnosis not present

## 2021-12-22 DIAGNOSIS — G2581 Restless legs syndrome: Secondary | ICD-10-CM | POA: Diagnosis not present

## 2021-12-22 DIAGNOSIS — E1122 Type 2 diabetes mellitus with diabetic chronic kidney disease: Secondary | ICD-10-CM | POA: Diagnosis not present

## 2021-12-22 DIAGNOSIS — N1831 Chronic kidney disease, stage 3a: Secondary | ICD-10-CM | POA: Diagnosis not present

## 2021-12-22 DIAGNOSIS — Z87440 Personal history of urinary (tract) infections: Secondary | ICD-10-CM | POA: Diagnosis not present

## 2021-12-22 DIAGNOSIS — R339 Retention of urine, unspecified: Secondary | ICD-10-CM | POA: Diagnosis not present

## 2021-12-22 DIAGNOSIS — Z7985 Long-term (current) use of injectable non-insulin antidiabetic drugs: Secondary | ICD-10-CM | POA: Diagnosis not present

## 2021-12-22 DIAGNOSIS — E039 Hypothyroidism, unspecified: Secondary | ICD-10-CM | POA: Diagnosis not present

## 2021-12-22 DIAGNOSIS — E1140 Type 2 diabetes mellitus with diabetic neuropathy, unspecified: Secondary | ICD-10-CM | POA: Diagnosis not present

## 2021-12-22 DIAGNOSIS — Z794 Long term (current) use of insulin: Secondary | ICD-10-CM | POA: Diagnosis not present

## 2021-12-22 DIAGNOSIS — I69351 Hemiplegia and hemiparesis following cerebral infarction affecting right dominant side: Secondary | ICD-10-CM | POA: Diagnosis not present

## 2021-12-22 DIAGNOSIS — G479 Sleep disorder, unspecified: Secondary | ICD-10-CM | POA: Diagnosis not present

## 2021-12-22 DIAGNOSIS — K5900 Constipation, unspecified: Secondary | ICD-10-CM | POA: Diagnosis not present

## 2021-12-23 DIAGNOSIS — G2581 Restless legs syndrome: Secondary | ICD-10-CM | POA: Diagnosis not present

## 2021-12-23 DIAGNOSIS — E1140 Type 2 diabetes mellitus with diabetic neuropathy, unspecified: Secondary | ICD-10-CM | POA: Diagnosis not present

## 2021-12-23 DIAGNOSIS — Z794 Long term (current) use of insulin: Secondary | ICD-10-CM | POA: Diagnosis not present

## 2021-12-23 DIAGNOSIS — G8929 Other chronic pain: Secondary | ICD-10-CM | POA: Diagnosis not present

## 2021-12-23 DIAGNOSIS — Z7985 Long-term (current) use of injectable non-insulin antidiabetic drugs: Secondary | ICD-10-CM | POA: Diagnosis not present

## 2021-12-23 DIAGNOSIS — K219 Gastro-esophageal reflux disease without esophagitis: Secondary | ICD-10-CM | POA: Diagnosis not present

## 2021-12-23 DIAGNOSIS — R339 Retention of urine, unspecified: Secondary | ICD-10-CM | POA: Diagnosis not present

## 2021-12-23 DIAGNOSIS — E1122 Type 2 diabetes mellitus with diabetic chronic kidney disease: Secondary | ICD-10-CM | POA: Diagnosis not present

## 2021-12-23 DIAGNOSIS — K5900 Constipation, unspecified: Secondary | ICD-10-CM | POA: Diagnosis not present

## 2021-12-23 DIAGNOSIS — G479 Sleep disorder, unspecified: Secondary | ICD-10-CM | POA: Diagnosis not present

## 2021-12-23 DIAGNOSIS — Z7902 Long term (current) use of antithrombotics/antiplatelets: Secondary | ICD-10-CM | POA: Diagnosis not present

## 2021-12-23 DIAGNOSIS — Z87440 Personal history of urinary (tract) infections: Secondary | ICD-10-CM | POA: Diagnosis not present

## 2021-12-23 DIAGNOSIS — N1831 Chronic kidney disease, stage 3a: Secondary | ICD-10-CM | POA: Diagnosis not present

## 2021-12-23 DIAGNOSIS — E039 Hypothyroidism, unspecified: Secondary | ICD-10-CM | POA: Diagnosis not present

## 2021-12-23 DIAGNOSIS — I69351 Hemiplegia and hemiparesis following cerebral infarction affecting right dominant side: Secondary | ICD-10-CM | POA: Diagnosis not present

## 2021-12-23 DIAGNOSIS — I129 Hypertensive chronic kidney disease with stage 1 through stage 4 chronic kidney disease, or unspecified chronic kidney disease: Secondary | ICD-10-CM | POA: Diagnosis not present

## 2021-12-23 DIAGNOSIS — E785 Hyperlipidemia, unspecified: Secondary | ICD-10-CM | POA: Diagnosis not present

## 2021-12-24 DIAGNOSIS — E1165 Type 2 diabetes mellitus with hyperglycemia: Secondary | ICD-10-CM | POA: Diagnosis not present

## 2021-12-26 DIAGNOSIS — K219 Gastro-esophageal reflux disease without esophagitis: Secondary | ICD-10-CM | POA: Diagnosis not present

## 2021-12-26 DIAGNOSIS — I10 Essential (primary) hypertension: Secondary | ICD-10-CM | POA: Diagnosis not present

## 2021-12-26 DIAGNOSIS — E039 Hypothyroidism, unspecified: Secondary | ICD-10-CM | POA: Diagnosis not present

## 2021-12-26 DIAGNOSIS — K5900 Constipation, unspecified: Secondary | ICD-10-CM | POA: Diagnosis not present

## 2021-12-26 DIAGNOSIS — E1169 Type 2 diabetes mellitus with other specified complication: Secondary | ICD-10-CM | POA: Diagnosis not present

## 2021-12-26 DIAGNOSIS — G629 Polyneuropathy, unspecified: Secondary | ICD-10-CM | POA: Diagnosis not present

## 2021-12-26 DIAGNOSIS — N1831 Chronic kidney disease, stage 3a: Secondary | ICD-10-CM | POA: Diagnosis not present

## 2021-12-26 DIAGNOSIS — Z794 Long term (current) use of insulin: Secondary | ICD-10-CM | POA: Diagnosis not present

## 2021-12-27 DIAGNOSIS — Z7902 Long term (current) use of antithrombotics/antiplatelets: Secondary | ICD-10-CM | POA: Diagnosis not present

## 2021-12-27 DIAGNOSIS — K219 Gastro-esophageal reflux disease without esophagitis: Secondary | ICD-10-CM | POA: Diagnosis not present

## 2021-12-27 DIAGNOSIS — E1140 Type 2 diabetes mellitus with diabetic neuropathy, unspecified: Secondary | ICD-10-CM | POA: Diagnosis not present

## 2021-12-27 DIAGNOSIS — E785 Hyperlipidemia, unspecified: Secondary | ICD-10-CM | POA: Diagnosis not present

## 2021-12-27 DIAGNOSIS — E039 Hypothyroidism, unspecified: Secondary | ICD-10-CM | POA: Diagnosis not present

## 2021-12-27 DIAGNOSIS — Z794 Long term (current) use of insulin: Secondary | ICD-10-CM | POA: Diagnosis not present

## 2021-12-27 DIAGNOSIS — N1831 Chronic kidney disease, stage 3a: Secondary | ICD-10-CM | POA: Diagnosis not present

## 2021-12-27 DIAGNOSIS — Z7985 Long-term (current) use of injectable non-insulin antidiabetic drugs: Secondary | ICD-10-CM | POA: Diagnosis not present

## 2021-12-27 DIAGNOSIS — K5900 Constipation, unspecified: Secondary | ICD-10-CM | POA: Diagnosis not present

## 2021-12-27 DIAGNOSIS — G8929 Other chronic pain: Secondary | ICD-10-CM | POA: Diagnosis not present

## 2021-12-27 DIAGNOSIS — I69351 Hemiplegia and hemiparesis following cerebral infarction affecting right dominant side: Secondary | ICD-10-CM | POA: Diagnosis not present

## 2021-12-27 DIAGNOSIS — G2581 Restless legs syndrome: Secondary | ICD-10-CM | POA: Diagnosis not present

## 2021-12-27 DIAGNOSIS — E1122 Type 2 diabetes mellitus with diabetic chronic kidney disease: Secondary | ICD-10-CM | POA: Diagnosis not present

## 2021-12-27 DIAGNOSIS — I129 Hypertensive chronic kidney disease with stage 1 through stage 4 chronic kidney disease, or unspecified chronic kidney disease: Secondary | ICD-10-CM | POA: Diagnosis not present

## 2021-12-27 DIAGNOSIS — R339 Retention of urine, unspecified: Secondary | ICD-10-CM | POA: Diagnosis not present

## 2021-12-27 DIAGNOSIS — G479 Sleep disorder, unspecified: Secondary | ICD-10-CM | POA: Diagnosis not present

## 2021-12-27 DIAGNOSIS — Z87440 Personal history of urinary (tract) infections: Secondary | ICD-10-CM | POA: Diagnosis not present

## 2021-12-29 DIAGNOSIS — E039 Hypothyroidism, unspecified: Secondary | ICD-10-CM | POA: Diagnosis not present

## 2021-12-29 DIAGNOSIS — E785 Hyperlipidemia, unspecified: Secondary | ICD-10-CM | POA: Diagnosis not present

## 2021-12-29 DIAGNOSIS — E1140 Type 2 diabetes mellitus with diabetic neuropathy, unspecified: Secondary | ICD-10-CM | POA: Diagnosis not present

## 2021-12-29 DIAGNOSIS — R339 Retention of urine, unspecified: Secondary | ICD-10-CM | POA: Diagnosis not present

## 2021-12-29 DIAGNOSIS — Z7902 Long term (current) use of antithrombotics/antiplatelets: Secondary | ICD-10-CM | POA: Diagnosis not present

## 2021-12-29 DIAGNOSIS — G2581 Restless legs syndrome: Secondary | ICD-10-CM | POA: Diagnosis not present

## 2021-12-29 DIAGNOSIS — I129 Hypertensive chronic kidney disease with stage 1 through stage 4 chronic kidney disease, or unspecified chronic kidney disease: Secondary | ICD-10-CM | POA: Diagnosis not present

## 2021-12-29 DIAGNOSIS — G8929 Other chronic pain: Secondary | ICD-10-CM | POA: Diagnosis not present

## 2021-12-29 DIAGNOSIS — I69351 Hemiplegia and hemiparesis following cerebral infarction affecting right dominant side: Secondary | ICD-10-CM | POA: Diagnosis not present

## 2021-12-29 DIAGNOSIS — G479 Sleep disorder, unspecified: Secondary | ICD-10-CM | POA: Diagnosis not present

## 2021-12-29 DIAGNOSIS — Z794 Long term (current) use of insulin: Secondary | ICD-10-CM | POA: Diagnosis not present

## 2021-12-29 DIAGNOSIS — K5900 Constipation, unspecified: Secondary | ICD-10-CM | POA: Diagnosis not present

## 2021-12-29 DIAGNOSIS — Z7985 Long-term (current) use of injectable non-insulin antidiabetic drugs: Secondary | ICD-10-CM | POA: Diagnosis not present

## 2021-12-29 DIAGNOSIS — Z87440 Personal history of urinary (tract) infections: Secondary | ICD-10-CM | POA: Diagnosis not present

## 2021-12-29 DIAGNOSIS — N1831 Chronic kidney disease, stage 3a: Secondary | ICD-10-CM | POA: Diagnosis not present

## 2021-12-29 DIAGNOSIS — K219 Gastro-esophageal reflux disease without esophagitis: Secondary | ICD-10-CM | POA: Diagnosis not present

## 2021-12-29 DIAGNOSIS — E1122 Type 2 diabetes mellitus with diabetic chronic kidney disease: Secondary | ICD-10-CM | POA: Diagnosis not present

## 2022-01-01 ENCOUNTER — Inpatient Hospital Stay
Admission: EM | Admit: 2022-01-01 | Discharge: 2022-01-03 | DRG: 177 | Disposition: A | Discharge status: Home Health | Payer: Medicare Other | Attending: Internal Medicine | Admitting: Internal Medicine

## 2022-01-01 ENCOUNTER — Emergency Department: Payer: Medicare Other

## 2022-01-01 ENCOUNTER — Encounter: Payer: Self-pay | Admitting: Radiology

## 2022-01-01 ENCOUNTER — Inpatient Hospital Stay: Payer: Medicare Other

## 2022-01-01 DIAGNOSIS — E1122 Type 2 diabetes mellitus with diabetic chronic kidney disease: Secondary | ICD-10-CM | POA: Diagnosis present

## 2022-01-01 DIAGNOSIS — E039 Hypothyroidism, unspecified: Secondary | ICD-10-CM | POA: Diagnosis not present

## 2022-01-01 DIAGNOSIS — R55 Syncope and collapse: Secondary | ICD-10-CM | POA: Diagnosis not present

## 2022-01-01 DIAGNOSIS — Z85828 Personal history of other malignant neoplasm of skin: Secondary | ICD-10-CM

## 2022-01-01 DIAGNOSIS — I129 Hypertensive chronic kidney disease with stage 1 through stage 4 chronic kidney disease, or unspecified chronic kidney disease: Secondary | ICD-10-CM | POA: Diagnosis present

## 2022-01-01 DIAGNOSIS — R404 Transient alteration of awareness: Secondary | ICD-10-CM | POA: Diagnosis not present

## 2022-01-01 DIAGNOSIS — K219 Gastro-esophageal reflux disease without esophagitis: Secondary | ICD-10-CM | POA: Diagnosis not present

## 2022-01-01 DIAGNOSIS — J1282 Pneumonia due to coronavirus disease 2019: Secondary | ICD-10-CM | POA: Diagnosis present

## 2022-01-01 DIAGNOSIS — I10 Essential (primary) hypertension: Secondary | ICD-10-CM | POA: Diagnosis not present

## 2022-01-01 DIAGNOSIS — Z7902 Long term (current) use of antithrombotics/antiplatelets: Secondary | ICD-10-CM

## 2022-01-01 DIAGNOSIS — E119 Type 2 diabetes mellitus without complications: Secondary | ICD-10-CM

## 2022-01-01 DIAGNOSIS — N138 Other obstructive and reflux uropathy: Secondary | ICD-10-CM | POA: Diagnosis not present

## 2022-01-01 DIAGNOSIS — Z8673 Personal history of transient ischemic attack (TIA), and cerebral infarction without residual deficits: Secondary | ICD-10-CM

## 2022-01-01 DIAGNOSIS — E1140 Type 2 diabetes mellitus with diabetic neuropathy, unspecified: Secondary | ICD-10-CM | POA: Diagnosis present

## 2022-01-01 DIAGNOSIS — E162 Hypoglycemia, unspecified: Secondary | ICD-10-CM | POA: Diagnosis not present

## 2022-01-01 DIAGNOSIS — G2581 Restless legs syndrome: Secondary | ICD-10-CM | POA: Diagnosis not present

## 2022-01-01 DIAGNOSIS — F331 Major depressive disorder, recurrent, moderate: Secondary | ICD-10-CM | POA: Diagnosis present

## 2022-01-01 DIAGNOSIS — R6889 Other general symptoms and signs: Secondary | ICD-10-CM | POA: Diagnosis not present

## 2022-01-01 DIAGNOSIS — E785 Hyperlipidemia, unspecified: Secondary | ICD-10-CM | POA: Diagnosis not present

## 2022-01-01 DIAGNOSIS — I499 Cardiac arrhythmia, unspecified: Secondary | ICD-10-CM | POA: Diagnosis not present

## 2022-01-01 DIAGNOSIS — U071 COVID-19: Secondary | ICD-10-CM | POA: Diagnosis not present

## 2022-01-01 DIAGNOSIS — I6381 Other cerebral infarction due to occlusion or stenosis of small artery: Secondary | ICD-10-CM | POA: Diagnosis not present

## 2022-01-01 DIAGNOSIS — R531 Weakness: Secondary | ICD-10-CM

## 2022-01-01 DIAGNOSIS — I959 Hypotension, unspecified: Secondary | ICD-10-CM | POA: Diagnosis present

## 2022-01-01 DIAGNOSIS — M6282 Rhabdomyolysis: Secondary | ICD-10-CM | POA: Diagnosis present

## 2022-01-01 DIAGNOSIS — Z801 Family history of malignant neoplasm of trachea, bronchus and lung: Secondary | ICD-10-CM

## 2022-01-01 DIAGNOSIS — I69398 Other sequelae of cerebral infarction: Secondary | ICD-10-CM | POA: Diagnosis not present

## 2022-01-01 DIAGNOSIS — Z87440 Personal history of urinary (tract) infections: Secondary | ICD-10-CM

## 2022-01-01 DIAGNOSIS — N401 Enlarged prostate with lower urinary tract symptoms: Secondary | ICD-10-CM | POA: Diagnosis not present

## 2022-01-01 DIAGNOSIS — G4700 Insomnia, unspecified: Secondary | ICD-10-CM | POA: Diagnosis not present

## 2022-01-01 DIAGNOSIS — G4733 Obstructive sleep apnea (adult) (pediatric): Secondary | ICD-10-CM

## 2022-01-01 DIAGNOSIS — E1169 Type 2 diabetes mellitus with other specified complication: Secondary | ICD-10-CM | POA: Diagnosis present

## 2022-01-01 DIAGNOSIS — R059 Cough, unspecified: Secondary | ICD-10-CM | POA: Diagnosis not present

## 2022-01-01 DIAGNOSIS — Z823 Family history of stroke: Secondary | ICD-10-CM

## 2022-01-01 DIAGNOSIS — R1110 Vomiting, unspecified: Secondary | ICD-10-CM | POA: Diagnosis not present

## 2022-01-01 DIAGNOSIS — E11649 Type 2 diabetes mellitus with hypoglycemia without coma: Secondary | ICD-10-CM | POA: Diagnosis present

## 2022-01-01 DIAGNOSIS — Z9989 Dependence on other enabling machines and devices: Secondary | ICD-10-CM

## 2022-01-01 DIAGNOSIS — G9341 Metabolic encephalopathy: Secondary | ICD-10-CM | POA: Diagnosis not present

## 2022-01-01 DIAGNOSIS — Z79890 Hormone replacement therapy (postmenopausal): Secondary | ICD-10-CM

## 2022-01-01 DIAGNOSIS — Z833 Family history of diabetes mellitus: Secondary | ICD-10-CM

## 2022-01-01 DIAGNOSIS — R0902 Hypoxemia: Secondary | ICD-10-CM | POA: Diagnosis not present

## 2022-01-01 DIAGNOSIS — Z79899 Other long term (current) drug therapy: Secondary | ICD-10-CM

## 2022-01-01 DIAGNOSIS — Z794 Long term (current) use of insulin: Secondary | ICD-10-CM

## 2022-01-01 DIAGNOSIS — N1831 Chronic kidney disease, stage 3a: Secondary | ICD-10-CM | POA: Diagnosis not present

## 2022-01-01 DIAGNOSIS — R001 Bradycardia, unspecified: Secondary | ICD-10-CM | POA: Diagnosis not present

## 2022-01-01 DIAGNOSIS — R918 Other nonspecific abnormal finding of lung field: Secondary | ICD-10-CM | POA: Diagnosis not present

## 2022-01-01 DIAGNOSIS — D696 Thrombocytopenia, unspecified: Secondary | ICD-10-CM | POA: Diagnosis not present

## 2022-01-01 DIAGNOSIS — R1111 Vomiting without nausea: Secondary | ICD-10-CM | POA: Diagnosis not present

## 2022-01-01 DIAGNOSIS — N390 Urinary tract infection, site not specified: Secondary | ICD-10-CM | POA: Diagnosis present

## 2022-01-01 DIAGNOSIS — N281 Cyst of kidney, acquired: Secondary | ICD-10-CM | POA: Diagnosis not present

## 2022-01-01 DIAGNOSIS — Z841 Family history of disorders of kidney and ureter: Secondary | ICD-10-CM

## 2022-01-01 DIAGNOSIS — N200 Calculus of kidney: Secondary | ICD-10-CM | POA: Diagnosis not present

## 2022-01-01 DIAGNOSIS — R112 Nausea with vomiting, unspecified: Secondary | ICD-10-CM | POA: Diagnosis not present

## 2022-01-01 DIAGNOSIS — J189 Pneumonia, unspecified organism: Secondary | ICD-10-CM | POA: Diagnosis not present

## 2022-01-01 DIAGNOSIS — R4182 Altered mental status, unspecified: Secondary | ICD-10-CM | POA: Diagnosis present

## 2022-01-01 DIAGNOSIS — Z743 Need for continuous supervision: Secondary | ICD-10-CM | POA: Diagnosis not present

## 2022-01-01 DIAGNOSIS — N3289 Other specified disorders of bladder: Secondary | ICD-10-CM | POA: Diagnosis not present

## 2022-01-01 DIAGNOSIS — I69351 Hemiplegia and hemiparesis following cerebral infarction affecting right dominant side: Secondary | ICD-10-CM | POA: Diagnosis not present

## 2022-01-01 DIAGNOSIS — R269 Unspecified abnormalities of gait and mobility: Secondary | ICD-10-CM | POA: Diagnosis not present

## 2022-01-01 DIAGNOSIS — G319 Degenerative disease of nervous system, unspecified: Secondary | ICD-10-CM | POA: Diagnosis not present

## 2022-01-01 LAB — LACTIC ACID, PLASMA
Lactic Acid, Venous: 1.6 mmol/L (ref 0.5–1.9)
Lactic Acid, Venous: 1.2 mmol/L (ref 0.5–1.9)

## 2022-01-01 LAB — URINALYSIS, ROUTINE W REFLEX MICROSCOPIC
Bacteria, UA: NONE SEEN
Bilirubin Urine: NEGATIVE
Glucose, UA: >=500 mg/dL — AB
Ketones, ur: NEGATIVE mg/dL
Leukocytes,Ua: NEGATIVE
Nitrite: NEGATIVE
Protein, ur: 100 mg/dL — AB
Specific Gravity, Urine: 1.015 (ref 1.005–1.030)
pH: 6 (ref 5.0–8.0)

## 2022-01-01 LAB — GLUCOSE, CAPILLARY
Glucose-Capillary: 102 mg/dL — ABNORMAL HIGH (ref 70–99)
Glucose-Capillary: 127 mg/dL — ABNORMAL HIGH (ref 70–99)
Glucose-Capillary: 139 mg/dL — ABNORMAL HIGH (ref 70–99)
Glucose-Capillary: 157 mg/dL — ABNORMAL HIGH (ref 70–99)
Glucose-Capillary: 64 mg/dL — ABNORMAL LOW (ref 70–99)

## 2022-01-01 LAB — CBG MONITORING, ED
Glucose-Capillary: 46 mg/dL — ABNORMAL LOW (ref 70–99)
Glucose-Capillary: 85 mg/dL (ref 70–99)
Glucose-Capillary: 163 mg/dL — ABNORMAL HIGH (ref 70–99)
Glucose-Capillary: 66 mg/dL — ABNORMAL LOW (ref 70–99)

## 2022-01-01 LAB — COMPREHENSIVE METABOLIC PANEL
ALT: 17 U/L (ref 0–44)
AST: 37 U/L (ref 15–41)
Albumin: 3.4 g/dL — ABNORMAL LOW (ref 3.5–5.0)
Alkaline Phosphatase: 44 U/L (ref 38–126)
Anion gap: 8 (ref 5–15)
BUN: 21 mg/dL (ref 8–23)
CO2: 24 mmol/L (ref 22–32)
Calcium: 8.3 mg/dL — ABNORMAL LOW (ref 8.9–10.3)
Chloride: 104 mmol/L (ref 98–111)
Creatinine, Ser: UNDETERMINED mg/dL (ref 0.61–1.24)
Glucose, Bld: 157 mg/dL — ABNORMAL HIGH (ref 70–99)
Potassium: 4.2 mmol/L (ref 3.5–5.1)
Sodium: 136 mmol/L (ref 135–145)
Total Bilirubin: 0.9 mg/dL (ref 0.3–1.2)
Total Protein: 7.2 g/dL (ref 6.5–8.1)

## 2022-01-01 LAB — BRAIN NATRIURETIC PEPTIDE: B Natriuretic Peptide: 180.8 pg/mL — ABNORMAL HIGH (ref 0.0–100.0)

## 2022-01-01 LAB — RESP PANEL BY RT-PCR (FLU A&B, COVID) ARPGX2
Influenza A by PCR: NEGATIVE
Influenza B by PCR: NEGATIVE
SARS Coronavirus 2 by RT PCR: POSITIVE — AB

## 2022-01-01 LAB — BASIC METABOLIC PANEL
Anion gap: 8 (ref 5–15)
BUN: 23 mg/dL (ref 8–23)
CO2: 23 mmol/L (ref 22–32)
Calcium: 7.9 mg/dL — ABNORMAL LOW (ref 8.9–10.3)
Chloride: 103 mmol/L (ref 98–111)
Creatinine, Ser: 1.39 mg/dL — ABNORMAL HIGH (ref 0.61–1.24)
GFR, Estimated: 49 mL/min — ABNORMAL LOW (ref >60)
Glucose, Bld: 109 mg/dL — ABNORMAL HIGH (ref 70–99)
Potassium: 3.7 mmol/L (ref 3.5–5.1)
Sodium: 134 mmol/L — ABNORMAL LOW (ref 135–145)

## 2022-01-01 LAB — CBC
HCT: 47.5 % (ref 39.0–52.0)
Hemoglobin: 14.7 g/dL (ref 13.0–17.0)
MCH: 28.7 pg (ref 26.0–34.0)
MCHC: 30.9 g/dL (ref 30.0–36.0)
MCV: 92.8 fL (ref 80.0–100.0)
Platelets: 148 10*3/uL — ABNORMAL LOW (ref 150–400)
RBC: 5.12 MIL/uL (ref 4.22–5.81)
RDW: 14 % (ref 11.5–15.5)
WBC: 9.5 10*3/uL (ref 4.0–10.5)
nRBC: 0 % (ref 0.0–0.2)

## 2022-01-01 LAB — PROTIME-INR
INR: 1.2 (ref 0.8–1.2)
Prothrombin Time: 14.9 seconds (ref 11.4–15.2)

## 2022-01-01 LAB — TROPONIN I (HIGH SENSITIVITY)
Troponin I (High Sensitivity): 7 ng/L (ref <18)
Troponin I (High Sensitivity): 9 ng/L (ref <18)

## 2022-01-01 LAB — PROCALCITONIN: Procalcitonin: <0.1 ng/mL

## 2022-01-01 LAB — TSH: TSH: 2.546 u[IU]/mL (ref 0.350–4.500)

## 2022-01-01 LAB — LIPASE, BLOOD: Lipase: 25 U/L (ref 11–51)

## 2022-01-01 LAB — MRSA NEXT GEN BY PCR, NASAL: MRSA by PCR Next Gen: DETECTED — AB

## 2022-01-01 LAB — MAGNESIUM: Magnesium: 1.6 mg/dL — ABNORMAL LOW (ref 1.7–2.4)

## 2022-01-01 LAB — CK: Total CK: 1187 U/L — ABNORMAL HIGH (ref 49–397)

## 2022-01-01 LAB — APTT: aPTT: 30 seconds (ref 24–36)

## 2022-01-01 MED ORDER — PANTOPRAZOLE SODIUM 40 MG PO TBEC
40.0000 mg | DELAYED_RELEASE_TABLET | Freq: Every day | ORAL | Status: DC
Start: 2022-01-02 — End: 2022-01-03
  Administered 2022-01-02 – 2022-01-03 (×2): 40 mg via ORAL
  Filled 2022-01-01 (×2): qty 1

## 2022-01-01 MED ORDER — HYDRALAZINE HCL 20 MG/ML IJ SOLN: 5.0000 mg | Freq: Four times a day (QID) | INTRAMUSCULAR | Status: DC | PRN

## 2022-01-01 MED ORDER — IPRATROPIUM-ALBUTEROL 0.5-2.5 (3) MG/3ML IN SOLN: 3.0000 mL | Freq: Four times a day (QID) | RESPIRATORY_TRACT | Status: DC | PRN

## 2022-01-01 MED ORDER — SODIUM CHLORIDE 0.9 % IV BOLUS (SEPSIS)
250.0000 mL | Freq: Once | INTRAVENOUS | Status: AC
  Administered 2022-01-01: 250 mL via INTRAVENOUS

## 2022-01-01 MED ORDER — NITROGLYCERIN 0.4 MG SL SUBL: 0.4000 mg | SUBLINGUAL_TABLET | SUBLINGUAL | Status: DC | PRN

## 2022-01-01 MED ORDER — ENOXAPARIN SODIUM 40 MG/0.4ML IJ SOSY: 40.0000 mg | PREFILLED_SYRINGE | Freq: Every day | INTRAMUSCULAR | Status: DC

## 2022-01-01 MED ORDER — ACETAMINOPHEN 325 MG PO TABS: 650.0000 mg | ORAL_TABLET | Freq: Four times a day (QID) | ORAL | Status: DC | PRN

## 2022-01-01 MED ORDER — SODIUM CHLORIDE 0.9 % IV SOLN
1.0000 g | INTRAVENOUS | Status: DC
  Filled 2022-01-01: qty 10

## 2022-01-01 MED ORDER — LEVOTHYROXINE SODIUM 50 MCG PO TABS
50.0000 ug | ORAL_TABLET | Freq: Every day | ORAL | Status: DC
  Administered 2022-01-02 – 2022-01-03 (×2): 50 ug via ORAL
  Filled 2022-01-01 (×2): qty 1

## 2022-01-01 MED ORDER — MAGNESIUM SULFATE IN D5W 1-5 GM/100ML-% IV SOLN
1.0000 g | Freq: Once | INTRAVENOUS | Status: AC
Start: 2022-01-01 — End: 2022-01-01
  Administered 2022-01-01: 1 g via INTRAVENOUS
  Filled 2022-01-01: qty 100

## 2022-01-01 MED ORDER — SODIUM CHLORIDE 0.9% FLUSH
3.0000 mL | Freq: Two times a day (BID) | INTRAVENOUS | Status: DC
  Administered 2022-01-01 – 2022-01-03 (×5): 3 mL via INTRAVENOUS

## 2022-01-01 MED ORDER — SODIUM CHLORIDE 0.9 % IV SOLN
2.0000 g | Freq: Two times a day (BID) | INTRAVENOUS | Status: DC
  Administered 2022-01-01 – 2022-01-02 (×2): 2 g via INTRAVENOUS
  Filled 2022-01-01 (×3): qty 12.5

## 2022-01-01 MED ORDER — GUAIFENESIN-DM 100-10 MG/5ML PO SYRP
10.0000 mL | ORAL_SOLUTION | ORAL | Status: DC | PRN
  Administered 2022-01-02 (×2): 10 mL via ORAL
  Filled 2022-01-01 (×2): qty 10

## 2022-01-01 MED ORDER — DULOXETINE HCL 30 MG PO CPEP
60.0000 mg | ORAL_CAPSULE | Freq: Every day | ORAL | Status: DC
Start: 2022-01-02 — End: 2022-01-03
  Administered 2022-01-02 – 2022-01-03 (×2): 60 mg via ORAL
  Filled 2022-01-01 (×2): qty 2

## 2022-01-01 MED ORDER — DEXTROSE 10 % IV SOLN
250.0000 mL | Freq: Once | INTRAVENOUS | Status: AC
  Administered 2022-01-01: 250 mL via INTRAVENOUS

## 2022-01-01 MED ORDER — VANCOMYCIN HCL IN DEXTROSE 1-5 GM/200ML-% IV SOLN
1000.0000 mg | INTRAVENOUS | Status: DC
  Filled 2022-01-01: qty 200

## 2022-01-01 MED ORDER — ALPHA-LIPOIC ACID 600 MG PO CAPS: 1.0000 | ORAL_CAPSULE | Freq: Every day | ORAL | Status: DC

## 2022-01-01 MED ORDER — TAMSULOSIN HCL 0.4 MG PO CAPS: 0.4000 mg | ORAL_CAPSULE | Freq: Every day | ORAL | Status: DC

## 2022-01-01 MED ORDER — GABAPENTIN 300 MG PO CAPS
600.0000 mg | ORAL_CAPSULE | Freq: Three times a day (TID) | ORAL | Status: DC
  Administered 2022-01-01 – 2022-01-03 (×6): 600 mg via ORAL
  Filled 2022-01-01 (×6): qty 2

## 2022-01-01 MED ORDER — ACETAMINOPHEN 10 MG/ML IV SOLN
1000.0000 mg | INTRAVENOUS | Status: AC
  Administered 2022-01-01: 1000 mg via INTRAVENOUS
  Filled 2022-01-01: qty 100

## 2022-01-01 MED ORDER — MUPIROCIN 2 % EX OINT
1.0000 "application " | TOPICAL_OINTMENT | Freq: Two times a day (BID) | CUTANEOUS | Status: DC
  Administered 2022-01-01 – 2022-01-03 (×4): 1 via NASAL
  Filled 2022-01-01 (×2): qty 22

## 2022-01-01 MED ORDER — SODIUM CHLORIDE 0.9 % IV SOLN
500.0000 mg | INTRAVENOUS | Status: DC
  Administered 2022-01-01: 500 mg via INTRAVENOUS
  Filled 2022-01-01 (×2): qty 5

## 2022-01-01 MED ORDER — ONDANSETRON HCL 4 MG PO TABS: 4.0000 mg | ORAL_TABLET | Freq: Four times a day (QID) | ORAL | Status: DC | PRN

## 2022-01-01 MED ORDER — ACETAMINOPHEN 650 MG RE SUPP: 650.0000 mg | Freq: Four times a day (QID) | RECTAL | Status: DC | PRN

## 2022-01-01 MED ORDER — INSULIN ASPART 100 UNIT/ML IJ SOLN: 0.0000 [IU] | Freq: Every day | INTRAMUSCULAR | Status: DC

## 2022-01-01 MED ORDER — ASCORBIC ACID 500 MG PO TABS
1000.0000 mg | ORAL_TABLET | Freq: Every day | ORAL | Status: DC
  Administered 2022-01-02 – 2022-01-03 (×2): 1000 mg via ORAL
  Filled 2022-01-01 (×2): qty 2

## 2022-01-01 MED ORDER — SODIUM CHLORIDE (HYPERTONIC) 5 % OP SOLN: 1.0000 [drp] | OPHTHALMIC | Status: DC | PRN

## 2022-01-01 MED ORDER — LACTATED RINGERS IV BOLUS
500.0000 mL | Freq: Once | INTRAVENOUS | Status: AC
  Administered 2022-01-01: 500 mL via INTRAVENOUS

## 2022-01-01 MED ORDER — MAGNESIUM SULFATE 2 GM/50ML IV SOLN
2.0000 g | Freq: Once | INTRAVENOUS | Status: AC
  Administered 2022-01-01: 2 g via INTRAVENOUS
  Filled 2022-01-01: qty 50

## 2022-01-01 MED ORDER — DEXTROSE 50 % IV SOLN
1.0000 | INTRAVENOUS | Status: DC | PRN
  Administered 2022-01-01: 50 mL via INTRAVENOUS

## 2022-01-01 MED ORDER — ENOXAPARIN SODIUM 40 MG/0.4ML IJ SOSY
40.0000 mg | PREFILLED_SYRINGE | INTRAMUSCULAR | Status: DC
  Administered 2022-01-01 – 2022-01-02 (×2): 40 mg via SUBCUTANEOUS
  Filled 2022-01-01 (×2): qty 0.4

## 2022-01-01 MED ORDER — SODIUM CHLORIDE 0.9 % IV BOLUS
500.0000 mL | Freq: Once | INTRAVENOUS | Status: AC
  Administered 2022-01-01: 500 mL via INTRAVENOUS

## 2022-01-01 MED ORDER — IPRATROPIUM-ALBUTEROL 0.5-2.5 (3) MG/3ML IN SOLN
3.0000 mL | Freq: Once | RESPIRATORY_TRACT | Status: AC
Start: 2022-01-01 — End: 2022-01-01
  Administered 2022-01-01: 3 mL via RESPIRATORY_TRACT
  Filled 2022-01-01: qty 3

## 2022-01-01 MED ORDER — CLOPIDOGREL BISULFATE 75 MG PO TABS
75.0000 mg | ORAL_TABLET | Freq: Every day | ORAL | Status: DC
  Administered 2022-01-02 – 2022-01-03 (×2): 75 mg via ORAL
  Filled 2022-01-01 (×2): qty 1

## 2022-01-01 MED ORDER — INSULIN ASPART 100 UNIT/ML IJ SOLN: 0.0000 [IU] | Freq: Three times a day (TID) | INTRAMUSCULAR | Status: DC

## 2022-01-01 MED ORDER — VANCOMYCIN HCL 2000 MG/400ML IV SOLN
2000.0000 mg | Freq: Once | INTRAVENOUS | Status: AC
  Administered 2022-01-01: 2000 mg via INTRAVENOUS
  Filled 2022-01-01: qty 400

## 2022-01-01 MED ORDER — CO Q-10 150 MG PO CAPS: 1.0000 | ORAL_CAPSULE | Freq: Every day | ORAL | Status: DC

## 2022-01-01 MED ORDER — CHLORHEXIDINE GLUCONATE CLOTH 2 % EX PADS: 6.0000 | MEDICATED_PAD | Freq: Every day | CUTANEOUS | Status: DC

## 2022-01-01 MED ORDER — MELATONIN 5 MG PO TABS
5.0000 mg | ORAL_TABLET | Freq: Every evening | ORAL | Status: DC | PRN
  Administered 2022-01-02: 5 mg via ORAL
  Filled 2022-01-01: qty 1

## 2022-01-01 MED ORDER — ONDANSETRON HCL 4 MG/2ML IJ SOLN: 4.0000 mg | Freq: Four times a day (QID) | INTRAMUSCULAR | Status: DC | PRN

## 2022-01-01 MED ORDER — DEXAMETHASONE SODIUM PHOSPHATE 10 MG/ML IJ SOLN
6.0000 mg | INTRAMUSCULAR | Status: DC
  Administered 2022-01-01 – 2022-01-02 (×2): 6 mg via INTRAVENOUS
  Filled 2022-01-01: qty 0.6
  Filled 2022-01-01: qty 1
  Filled 2022-01-01: qty 0.6

## 2022-01-01 MED ORDER — FAMOTIDINE 20 MG PO TABS
20.0000 mg | ORAL_TABLET | Freq: Every day | ORAL | Status: DC
Start: 2022-01-01 — End: 2022-01-03
  Administered 2022-01-01 – 2022-01-02 (×2): 20 mg via ORAL
  Filled 2022-01-01 (×2): qty 1

## 2022-01-01 MED ORDER — DEXTROSE 50 % IV SOLN
INTRAVENOUS | Status: AC
  Filled 2022-01-01: qty 50

## 2022-01-01 MED ORDER — NIRMATRELVIR/RITONAVIR (PAXLOVID) TABLET (RENAL DOSING)
2.0000 | ORAL_TABLET | Freq: Two times a day (BID) | ORAL | Status: DC
  Administered 2022-01-01 – 2022-01-03 (×4): 2 via ORAL
  Filled 2022-01-01: qty 20

## 2022-01-01 MED ORDER — MIDODRINE HCL 5 MG PO TABS
10.0000 mg | ORAL_TABLET | Freq: Once | ORAL | Status: AC
  Administered 2022-01-01: 10 mg via ORAL
  Filled 2022-01-01: qty 2

## 2022-01-01 MED ORDER — ONDANSETRON HCL 4 MG/2ML IJ SOLN
4.0000 mg | Freq: Once | INTRAMUSCULAR | Status: AC | PRN
  Administered 2022-01-01: 4 mg via INTRAVENOUS
  Filled 2022-01-01: qty 2

## 2022-01-01 NOTE — Progress Notes (Signed)
[facility] following for sepsis protocol. 

## 2022-01-01 NOTE — ED Notes (Addendum)
BGL 46, provider notified. ?

## 2022-01-01 NOTE — H&P (Addendum)
?History and Physical  ? ?James Spurr, MD JKD:[id number] DOB: 26-Apr-1931 DOA: 01/01/2022 ? ?PCP: [name], MD  ?Outpatient Specialists: Dr. [name], physical medicine and rehabilitation ?Patient coming from: [facility] via EMS ? ?I have personally briefly reviewed patient's old medical records in [place]. ? ?Chief Concern: Syncope/found down ? ?HPI: James Bell is a 88 year old male with history of insulin-dependent diabetes mellitus, hypothyroid, OSA on CPAP, history of CVA, CKD stage IIIa, GERD, BPH, history of right-sided hemiparesis presumed secondary to late progression from CVA, with dominant side mastic hemiparesis, generalized weakness, hypomagnesemia, who presents emergency department for chief concerns of being found down/syncope. ? ?Initial vitals in the emergency department showed temperature of 98.9, and increased to 100, respiration rate of 21, heart rate of 79, blood pressure 125/64, SPO2 98% on room air. ? ?Serum sodium is 136, potassium 4.2, chloride 104, bicarb 25, BUN of 21, serum creatinine and GFR quantity not sufficient to determine.  Nonfasting blood glucose 157.  Anion gap was 8.  High sensitive troponin was initially 7 and increased to 9. ? ?TSH was 2.546.  Serum calcium is 8.3. ? ?WBC is 9.5, hemoglobin 14.7, platelets of 148. ? ?UA was negative for leukocytes and nitrates.  UA was positive for greater than 500 of glucose, and hemoglobin showed small. ? ?COVID/influenza A/influenza B PCR resulted and COVID was positive. ? ?ED imaging: ? ?Portable chest x-ray was read as no acute cardiopulmonary abnormality or acute traumatic injury identified. ? ?CT head without contrast was read as no acute traumatic injury identified.  Stable noncontrast CT appearance of the brain with chronic infarct of the left corona radiata and lentiform.  New mild paranasal sinus inflammation, small volume retained secretions in the nasopharynx. ? ?CT chest abdomen and pelvis  without contrast was read as no acute traumatic injury identified in the noncontrast chest, abdomen, pelvis.  Improved atelectasis from last month with new bandlike also mildly spiculated opacity in the right middle lobe (12 mm).  Distended urinary bladder (415 mL).  Chronic nephrolithiasis.  Aortic atherosclerosis. ? ?ED treatment: DuoNebs x1, dextrose 10% infusion, sodium chloride 500 mL bolus, ondansetron 4 mg IV. ? ?At bedside patient is able to tell me his full name, age, current location of hospital.  He is able to identify his daughter at bedside. ? ?Per daughter at bedside, patient appears weaker than his baseline.  She also states that he is on his baseline in terms of mental status and is more delayed.  She reports there was no reported fever at independent living facility. ? ?He denies shortness of breath, chest pain, abdominal pain. ? ?Social history: He is from [facility].  He denies tobacco, EtOH use. ? ?ROS: ?Constitutional: no weight change, no fever ?ENT/Mouth: no sore throat, no rhinorrhea ?Eyes: no eye pain, no vision changes ?Cardiovascular: no chest pain, no dyspnea,  no edema, no palpitations ?Respiratory: no cough, no sputum, no wheezing ?Gastrointestinal: no nausea, no vomiting, no diarrhea, no constipation ?Genitourinary: no urinary incontinence, no dysuria, no hematuria ?Musculoskeletal: no arthralgias, no myalgias ?Skin: no skin lesions, no pruritus, ?Neuro: + weakness, + loss of consciousness, + syncope ?Psych: no anxiety, no depression, + decrease appetite ?Heme/Lymph: no bruising, no bleeding ? ?ED Course: Discussed with emergency medicine provider, patient requiring hospitalization for chief concerns of syncope/found down presumed secondary to hypoglycemia. ? ?Assessment/Plan ? ?Principal Problem: ?  Syncope ?Active Problems: ?  History of CVA (cerebrovascular accident) ?  Hemiparesis affecting right side  as late effect of cerebrovascular accident (CVA) ([place]) ?  OSA  on CPAP ?  Chronic kidney disease, stage 3a ([place]) ?  Hyperlipidemia associated with type 2 diabetes mellitus ([place]) ?  Restless leg syndrome, severe ?  GERD (gastroesophageal reflux disease) ?  BPH with obstruction/lower urinary tract symptoms ?  MDD (major depressive disorder), recurrent episode, moderate ([place]) ?  HTN (hypertension) ?  Gait disturbance, post-stroke ?  Thrombocytopenia ([place]) ?  Frequent UTI ?  General weakness ?  Insulin dependent type 2 diabetes mellitus ([name]) ?  Insomnia ?  Hypoglycemia ?  Altered mental status ?  Pneumonia due to COVID-19 virus ?  ?Assessment and Plan: ? ?* Syncope ?- Etiology work-up in progress, initial working diagnosis is hypoglycemia ?- Complete echo ordered, orthostatic hypotension ordered, check magnesium, check CK level, MRI of the brain without contrast ?- Elevated temperature of 100.1 patient received acetaminophen per ED RN ?- Blood cultures x2 ordered ?- Fall precautions, aspiration precautions, strict I's and O's ?- Admit to telemetry cardiac, observation ? ?Pneumonia due to COVID-19 virus ?- Paxlovid cannot be ordered at the time of admission, due to pending serum creatinine and GFR ?- Repeat BMP ordered ?- Conservative fluid administration at this time ?- Airborne and contact precautions, aspiration precaution ? ?Altered mental status ?- With low normotensive, MAP decreasing to 64 at bedside ?- Repeat blood pressure with manual cuff per nursing was 100/58 ?- Midodrine 10 mg p.o. one-time dose ordered ?- Admit to stepdown, inpatient ? ?Hypoglycemia ?- Status post dextrose 10 infusion at 750 mL/h, and dextrose 10 infusion at 1500 mL/h IV bolus ?- CBG monitoring, 3 times daily AC and nightly and every morning ?- D50, 1 amp, as needed, 3 doses ordered with instructions to give as needed and then let provider know ? ?Insomnia ?- Melatonin 5 mg nightly as needed for sleep ordered ? ?Insulin dependent type 2 diabetes mellitus ([name]) ?- Home long-acting insulin glargine 20  units nightly and insulin sliding scale has not been resumed due to presentation of syncope with possible hypoglycemia ?- Hospital Insulin SSI with at bedtime coverage ordered, with 3 times daily AC glucose monitoring ordered ?- Goal inpatient blood glucose levels 140-180 ? ?HTN (hypertension) ?- Hydralazine injection 5 mg IV every 6 hours as needed for SBP greater than 170, 3 days ordered ? ?MDD (major depressive disorder), recurrent episode, moderate ([place]) ?- Resumed duloxetine 60 mg daily ? ?BPH with obstruction/lower urinary tract symptoms ?- Per med reconciliation patient is no longer taking Flomax ?- Given distended bladder on CT imaging, I have ordered Flomax daily on admission, 2 doses ordered ? ?GERD (gastroesophageal reflux disease) ?- Home famotidine 20 mg nightly resumed, daily PPI resumed ? ?Restless leg syndrome, severe ?- Resumed home gabapentin 600 mg p.o. 3 times daily ? ?Chart reviewed.  ? ?DVT prophylaxis: Enoxaparin ?Code Status: Full code ?Diet: Heart healthy/carb modified ?Family Communication: Updated daughter, who states she is the primary healthcare power of attorney, Wells Guiles at bedside ?Disposition Plan: Pending clinical course, guarded prognosis ?Consults called: None at this time ?Admission status:  stepdown, inpatient ? ?Past Medical History:  ?Diagnosis Date  ? Anemia   ? Basal cell carcinoma 2018  ? R temple   ? Basal cell carcinoma of nose 2002  ? bridge of nose  ? BPH with obstruction/lower urinary tract symptoms   ? failed flomax, uroxatral, myrbetriq - sees urology [name]) pending videourodynamics  ? CKD (chronic kidney disease) stage 3, GFR 30-59 ml/min (HCC) 03/11/2012  ?  Renal US 02/2014 - multiple kidney cysts   ? CVA (cerebral infarction) 03/07/2012  ? slurred speech; right hemplegia, left handed - completed PT 07/2014 (17 sessions)  ? Dehydration   ? Depression   ? mild, on cymbalta per prior PCP  ? Erectile dysfunction   ? per urology (prostaglandin injection)  ? GERD  (gastroesophageal reflux disease)   ? with sliding HH  ? H/O hiatal hernia   ? History of kidney problems 12/2010  ? lacerated left kidney after fall  ? HLD (hyperlipidemia)   ? Hx of basal cell carcin

## 2022-01-01 NOTE — ED Provider Notes (Signed)
Angiocath insertion ?Performed by: Delman Kitten ? ?Patient with difficult lab draws.  Multiple attempts unable to obtain additional labs at this time, thus we personally performed IV cannulation with ultrasound guidance.  Patient identity confirmed via wristband prior to procedure ? ?Preparation: Patient was prepped and draped in the usual sterile fashion. ? ?Vein Location: Right antecubital ? ?Ultrasound Guided ? ?Gauge: 20 ? ?Chlorhexidine prep and sterile ultrasound gel utilized ? ?Normal blood return and flush without difficulty ?Patient tolerance: Patient tolerated the procedure well with no immediate complications. ? ? ?  Delman Kitten, MD ?01/01/22 1619 ? ?

## 2022-01-01 NOTE — Assessment & Plan Note (Addendum)
-   Continue Home famotidine 20 mg nightly  ?

## 2022-01-01 NOTE — Plan of Care (Signed)
  Problem: Education: Goal: Knowledge of General Education information will improve Description: Including pain rating scale, medication(s)/side effects and non-pharmacologic comfort measures Outcome: Progressing   Problem: Pain Managment: Goal: General experience of comfort will improve Outcome: Progressing   Problem: Skin Integrity: Goal: Risk for impaired skin integrity will decrease Outcome: Progressing   

## 2022-01-01 NOTE — Assessment & Plan Note (Addendum)
Continue melatonin 5 mg nightly as needed for sleep  ?

## 2022-01-01 NOTE — ED Provider Notes (Signed)
COVID +  ? ?Dr. Tobie Poet aware ?  ?Delman Kitten, MD ?01/01/22 1545 ? ?

## 2022-01-01 NOTE — ED Notes (Signed)
This RN attempted to stick pt x 2 for lab work. Not able to obtain specimens. Order placed for stat IV team consult due to hypoglycemia ?

## 2022-01-01 NOTE — Assessment & Plan Note (Addendum)
Continue home gabapentin 600 mg p.o. 3 times daily ?

## 2022-01-01 NOTE — ED Provider Notes (Addendum)
? ?[facility] ?Provider Note ? ? ? Event Date/Time  ? First MD Initiated Contact with Patient 01/01/22 1110   ?  (approximate) ? ? ?History  ? ?Fall ? ? ?HPI ? 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 ?Recent admission about 1 month ago for aspiration pneumonia ? ?Patient reports that he felt okay yesterday.  Got up this morning felt sort of weak not in any 1 spot but just fatigue and quite feel himself.  Then he recalls waking up vomiting on the floor.  Denies any injury.  Did not hurt himself, but reports that he is not sure exactly what happened.  No headache no neck pain.  Has chronic numbness and weakness of his right arm and right leg.  ? ?At the present time denies any chest pain.  No shortness of breath.  Does not recall what happened. ? ?Reports has chronic swelling of both lower legs no change from baseline ? ?Patient is alert really well oriented.  Nauseated, having active slightly bilious appearance of emesis into emesis bag. ?  ? ? ?Physical Exam  ? ?Triage Vital Signs: ?ED Triage Vitals [01/01/22 1109]  ?Enc Vitals Group  ?   BP   ?   Pulse   ?   Resp   ?   Temp   ?   Temp src   ?   SpO2   ?   Weight 194 lb 14.2 oz (88.4 kg)  ?   Height   ?   Head Circumference   ?   Peak Flow   ?   Pain Score 0  ?   Pain Loc   ?   Pain Edu?   ?   Excl. in [place]?   ? ? ?Most recent vital signs: ?Vitals:  ? 01/01/22 1249 01/01/22 1345  ?BP:  127/75  ?Pulse: 92 96  ?Resp: (!) 39 (!) 22  ?Temp:  100 ?F (37.8 ?C)  ?SpO2: 98% 99%  ? ? ? ?General: Awake, no distress.  Does however appear nauseated, holding emesis bag in left hand.  Some active vomiting of bilious emesis.  Managing secretions well does not appear to be actively aspirating. ?CV:  Good peripheral perfusion.  Normal heart tones no murmur.   Regular. ?Resp:  Normal effort.  Some scant expiratory wheezing.  Normal saturation on room air.  No noted rales or crackles.  No respiratory distress. ?Abd:  With abdomen is quite protuberant or appears slightly distended, but soft and he denies pain to palpation in any quadrant. ?Other:  2+ bilateral lower extremity pitting edema patient reports this is chronic and typical level of edema ? ? ?ED Results / Procedures / Treatments  ? ?Labs ?(all labs ordered are listed, but only abnormal results are displayed) ?Labs Reviewed  ?CBC - Abnormal; Notable for the following components:  ?    Result Value  ? Platelets 148 (*)   ? All other components within normal limits  ?URINALYSIS, ROUTINE W REFLEX MICROSCOPIC - Abnormal; Notable for the following components:  ? Color, Urine YELLOW (*)   ? APPearance CLEAR (*)   ? Glucose, UA >=500 (*)   ? Hgb urine dipstick SMALL (*)   ? Protein, ur 100 (*)   ? All other components within normal  limits  ?COMPREHENSIVE METABOLIC PANEL - Abnormal; Notable for the following components:  ? Glucose, Bld 157 (*)   ? Calcium 8.3 (*)   ? Albumin 3.4 (*)   ? All other components within normal limits  ?BRAIN NATRIURETIC PEPTIDE - Abnormal; Notable for the following components:  ? B Natriuretic Peptide 180.8 (*)   ? All other components within normal limits  ?CBG MONITORING, ED - Abnormal; Notable for the following components:  ? Glucose-Capillary 46 (*)   ? All other components within normal limits  ?CBG MONITORING, ED - Abnormal; Notable for the following components:  ? Glucose-Capillary 66 (*)   ? All other components within normal limits  ?CBG MONITORING, ED - Abnormal; Notable for the following components:  ? Glucose-Capillary 163 (*)   ? All other components within normal limits  ?RESP PANEL BY RT-PCR (FLU A&B, COVID) [id number]  ?GASTROINTESTINAL PANEL BY PCR, STOOL (REPLACES STOOL CULTURE)  ?C DIFFICILE QUICK SCREEN W PCR REFLEX    ?LIPASE, BLOOD  ?TSH  ?CBG MONITORING, ED  ?CBG MONITORING,  ED  ?CBG MONITORING, ED  ?CBG MONITORING, ED  ?CBG MONITORING, ED  ?CBG MONITORING, ED  ?TROPONIN I (HIGH SENSITIVITY)  ?TROPONIN I (HIGH SENSITIVITY)  ? ? ? ?EKG ? ?Reviewed and interpreted at 1115 heart rate 90 QRS 110 ?QTc 450 ?Normal sinus rhythm, left anterior fascicular block.  No evidence of acute ischemia ? ? ?RADIOLOGY ? ? ?CT Head Wo Contrast ? ?Result Date: 01/01/2022 ?CLINICAL DATA:  88 year old male found down. Weakness, vomiting, fall. EXAM: CT HEAD WITHOUT CONTRAST TECHNIQUE: Contiguous axial images were obtained from the base of the skull through the vertex without intravenous contrast. RADIATION DOSE REDUCTION: This exam was performed according to the departmental dose-optimization program which includes automated exposure control, adjustment of the mA and/or kV according to patient size and/or use of iterative reconstruction technique. COMPARISON:  Brain MRI and head CT 11/21/2021 and earlier. FINDINGS: Brain: Chronic lacunar infarct of the posterior left corona radiata and lentiform. Stable associated white matter hypodensity. Stable mild ex vacuo enlargement of the left lateral ventricle. No midline shift, ventriculomegaly, mass effect, evidence of mass lesion, intracranial hemorrhage or evidence of cortically based acute infarction. Stable gray-white matter differentiation throughout the brain. Vascular: Calcified atherosclerosis at the skull base. No suspicious intracranial vascular hyperdensity. Skull: Stable.  No acute osseous abnormality identified. Sinuses/Orbits: Small fluid levels now in the paranasal sinuses, most conspicuous in the maxillary sinuses. Mild new mucosal thickening in the frontoethmoidal recesses also. Tympanic cavities and mastoids are clear. Other: Small volume retained secretions in the nasopharynx. No acute orbit or scalp soft tissue injury identified. IMPRESSION: 1. No acute traumatic injury identified. 2. Stable non contrast CT appearance of the brain with chronic  infarct of the left corona radiata and lentiform. 3. New mild paranasal sinus inflammation, and small volume retained secretions in the nasopharynx. Electronically Signed   By: Genevie Ann M.D.   On: 01/01/2022 11:49  ? ?DG Chest Portable 1 View ? ?Result Date: 01/01/2022 ?CLINICAL DATA:  88 year old male found down. Weakness, vomiting, fall. EXAM: PORTABLE CHEST 1 VIEW COMPARISON:  CT Chest, Abdomen, and Pelvis today reported separately. FINDINGS: Portable AP upright view at 1126 hours. Somewhat low lung volumes stable from last month. Stable cardiac size and mediastinal contours. Calcified aortic atherosclerosis. Visualized tracheal air column is within normal limits. No pneumothorax, pulmonary edema or consolidation. No evidence of pleural effusion. No acute osseous abnormality identified. Paucity of bowel gas in the upper abdomen. IMPRESSION:  No acute cardiopulmonary abnormality or acute traumatic injury identified. Electronically Signed   By: Genevie Ann M.D.   On: 01/01/2022 11:46  ? ?CT CHEST ABDOMEN PELVIS WO CONTRAST ? ?Result Date: 01/01/2022 ?CLINICAL DATA:  88 year old male found down. Weakness, vomiting, fall. EXAM: CT CHEST, ABDOMEN AND PELVIS WITHOUT CONTRAST TECHNIQUE: Multidetector CT imaging of the chest, abdomen and pelvis was performed following the standard protocol without IV contrast. RADIATION DOSE REDUCTION: This exam was performed according to the departmental dose-optimization program which includes automated exposure control, adjustment of the mA and/or kV according to patient size and/or use of iterative reconstruction technique. COMPARISON:  Portable chest x-ray today. CT Abdomen and Pelvis 11/21/2021. FINDINGS: CT CHEST FINDINGS Cardiovascular: Calcified coronary artery and aortic atherosclerosis. Stable heart size with mediastinal lipomatosis but no cardiomegaly or pericardial effusion. Vascular patency is not evaluated in the absence of IV contrast. Mediastinum/Nodes: Mediastinal lipomatosis.  No mediastinal mass or lymphadenopathy. Lungs/Pleura: Major airways are patent. Low lung volumes although improved from the prior CT Abdomen and Pelvis. Regressed but not resolved dependent atelectasis in both lungs. So

## 2022-01-01 NOTE — Significant Event (Addendum)
I reviewed patient's MEWs and repeat vitals and discovered that patient's MAP is 50.  ? ?I re-evaluated patient in Stepdown 6. Patient appears calm, does not appear to be in acute distress.  ? ?Discussed with RN regarding Midodrine ordered at 1545. Called pharmacy to verify midodrine 10 mg once time dose.  ? ?# Hypotension with mild elevation in temperature ?# Covid 19 infection ?# SIRs ?# Possible severe sepsis with septic shock ?- Blood culture, 2 sets ordered ?- Lactic acid ordered ?- NS bolus of 250 ml once ordered ?- Discussed with RN and pharmacy that we need to give pt midodrine now ?- broad spectrum abx ordered: vancomycine and cefepime per pharmacy ?- Consulted PCCM, intensivist for pressors ? ?Dr. Tobie Poet  ? ?

## 2022-01-01 NOTE — ED Notes (Signed)
RN given report to Angela,RN in Stepdown at this time. ?

## 2022-01-01 NOTE — Assessment & Plan Note (Signed)
-   Per med reconciliation patient is no longer taking Flomax ?- Given distended bladder on CT imaging, I have ordered Flomax daily on admission, 2 doses ordered ?

## 2022-01-01 NOTE — ED Notes (Addendum)
BGL 66, provider notified ?

## 2022-01-01 NOTE — Assessment & Plan Note (Addendum)
Continue duloxetine 60 mg daily ?

## 2022-01-01 NOTE — Consult Note (Addendum)
? ?NAME:  [name], MD, MRN:  [id number], DOB:  April 03, 1931, LOS: 0 ?ADMISSION DATE:  01/01/2022, CONSULTATION DATE:  01/01/22 ?REFERRING MD:  Dr. Tobie Poet, CHIEF COMPLAINT:   Fall ? ?History of Present Illness:  ?88 yo M presenting on 01/01/22 from [facility] via EMS after being found on the floor of his bathroom covered in vomit by his neighbor. Following history from patient/ ED & [place] documentation: patient last talked to his daughter on 4/15 at 2100, he reports feeling fine on 4/15. When he got up the morning of 4/16 he felt generalized weakness and not himself. He next recalls waking up on the floor and vomiting, he denies any blood in vomit, he also believes he was on the floor for about 6 hours. He denies any injury, but is not entirely sure what happened. Does admit to chronic numbness in his RIGHT arm & leg. ?ED course: ?Patient A&O on arrival with self-reported chronic BLE edema and nausea with bilious emesis. Patient hypoglycemic requiring D10 boluses. Febrile, concern for aspiration pneumonia, which he has a history with recent admission. Daughter, bedside in ED is documented as reporting her father appeared more weaker and that his mental status is more delayed from baseline ?Medications given: D10 bolus x 2, 500 mL bolus, zofran IV, Duoneb Initial Vitals: 98.9, 21, 79, 125/64, 98% on 1 L [place] ?Significant labs: (Labs/ Imaging personally reviewed) ?I, Domingo Pulse [place], AGACNP-BC, personally viewed and interpreted this ECG. ?EKG Interpretation: Date: 01/01/22, EKG Time: 11:14, Rate: 88, Rhythm: NSR, QRS Axis:  LAD, Intervals: probable LAFB, ST/T Wave abnormalities: none, Narrative Interpretation: NSR with LAD and probable LAFB ?**Most recent** ?Chemistry: Na+:134, K+: 3.7, BUN/Cr.: 23/1.39, Serum CO2/ AG: 23/8, Mg: 1.6, CK: 1187 ?Hematology: WBC: 9.5, Hgb: 14.7,  ?Troponin: 7 > 9, BNP: 180.8, Lactic/ PCT: 1.6 > 1.2/ <0.10,  ?COVID-19 & Influenza A/B: positive ? ?CXR 01/01/22: no  acute cardiopulmonary process or acute injury ?CT head wo contrast 01/01/22: No acute traumatic injury identified. ?Stable non contrast CT appearance of the brain with chronic ?infarct of the left corona radiata and lentiform. New mild paranasal sinus inflammation, and small volume retained secretions in the nasopharynx. ?CT chest/abdomen/pelvis wo contrast 01/01/22: No acute traumatic injury identified in the non-contrast chest, abdomen, or pelvis. Improved atelectasis from last month but new bandlike also mildly spiculated opacity in the right middle lobe (12 mm). Consider a non-contrast Chest CT at 3 months, a PET/CT, or tissue sampling. (These guidelines do not apply to immunocompromised patients and patients with cancer. Follow up in patients with significant co morbidities as clinically warranted. For lung cancer screening, adhere to Lung-RADS guidelines.) ? ?TRH consulted for admission to [facility] unit. Patient's BP became marginal and he was given 250 mL bolus and 10 mg of midodrine. ?PCCM consulted for admission due to potential need for vasopressor support and central line placement. ? ?Pertinent  Medical History  ?CVA (residual RIGHT hemiplegia hemiparesis) ?T2DM ?CKD Stage 3a ?OSA on CPAP ?GERD ?Migraine ?Hypothyroidism ?BPH ?Significant Hospital Events: ?Including procedures, antibiotic start and stop dates in addition to other pertinent events   ?01/01/22: Admit to SDU COVID+ with suspected syncope and fall PTA. Marginal BP's, PCCM consulted d/t concern for vasopressor support ? ?Interim History / Subjective:  ?Patient A&O x 4 but drowsy. No current complaints of nausea or discomfort. Discussed chronic BLE edema, patient reports wearing compression socks at home and that PCP has felt in the past this was venous stasis. He denies weight gain,  does admit to sleeping with his head elevated for comfort- but this is not new. Denies PND ? ?Objective   ?Blood pressure (!) 124/46, pulse 81, temperature 98.9 ?F  (37.2 ?C), temperature source Oral, resp. rate 17, height '5\' 8"'$  (1.727 m), weight 88.4 kg, SpO2 98 %. ?   ?   ? ?Intake/Output Summary (Last 24 hours) at 01/01/2022 2107 ?Last data filed at 01/01/2022 2100 ?Gross per 24 hour  ?Intake 720.48 ml  ?Output --  ?Net 720.48 ml  ? ?Filed Weights  ? 01/01/22 1109  ?Weight: 88.4 kg  ? ? ?Examination: ?General: Adult male, acutely ill, lying in bed, NAD ?HEENT: MM pink/moist, anicteric, atraumatic, neck supple ?Neuro: A&O x 4, follows commands, PERRL +3, MAE > baseline weakness in RUE/RLE 4/5 vs 5/5 on left side ?CV: s1s2 RRR, NSR on monitor, no r/m/g ?Pulm: Regular, non labored on RA, breath sounds clear-BUL & clear/diminished-BLL ?GI: soft, rounded, non tender, bs x 4 ?Skin: no rashes/lesions noted ?Extremities: warm/dry, pulses + 2 R/P, +1 edema noted BLE ? ?Resolved Hospital Problem list   ? ? ?Assessment & Plan:  ?Hypotension secondary to mild Rhabdomyolysis in the setting of fall PTA ?Syncope ?CK: 1187, UA + for myoglobinuria. Pt has received total of 750 mL of NS & 500 mL of D10 bolus for hypoglycemia. Patient received one dose of midodrine 10 mg. Low suspicion for septic shock as pt does not meet SIRS criteria and had no lactic acidosis, PCT negative as well. Mg: 1.6 ?- additional Mg supplementation ordered ?- BP marginal but stable >> will give additional 500 mL bolus due to mild rhabdo (limited due to elevated BNP and edema on exam) ?- consider levophed PRN to maintain MAP > 65 ?- place central line PRN ?- hydralazine PRN discontinued ?Agree with plan of care per [facility] service as follows: ?- echocardiogram ordered ?- f/u cultures ?- falls precautions ?- MRI brain wo contrast ordered ? ?COVID-19 Infection  ?Suspected Aspiration ?PMHx: OSA ?1st COVID infection, pt received total 3 vaccine shots ?- trend PCT ?- respiratory culture, f/u cultures > consider discontinuing vancomycin ?- supplemental oxygen PRN to maintain SpO2 > 90% ?Agree with TRH plan of care as follows: ?-  Paxlovid with renal dosing ?- azithromycin & cefepime ?- decadron IV daily ?- vitamin C & zinc ?- bronchodilators PRN ?- airborne/contact precautions ?- aggressive pulmonary toileting: IS & flutter ?- home CPAP QHS ? ?Hypoglycemia  ?PMHx: T2DM ?- Q 2 CBG checks until 4 stable blood glucose readings ?- transition to [facility] with SSI per [facility] service 4/17 ?- follow ICU hypo/hyper-glycemia protocol PRN ?- target CBG 140-180 ? ?Best Practice (right click and "Reselect all SmartList Selections" daily)  ?Diet/type: NPO w/ oral meds ?DVT prophylaxis: LMWH ?GI prophylaxis: H2B ?Lines: N/A ?Foley:  N/A ?Code Status:  full code ?Last date of multidisciplinary goals of care discussion [01/01/22- primary serivce] ? ?Labs   ?CBC: ?Recent Labs  ?Lab 01/01/22 ?1118  ?WBC 9.5  ?HGB 14.7  ?HCT 47.5  ?MCV 92.8  ?PLT 148*  ? ? ?Basic Metabolic Panel: ?Recent Labs  ?Lab 01/01/22 ?1318 01/01/22 ?1714  ?NA 136 134*  ?K 4.2 3.7  ?CL 104 103  ?CO2 24 23  ?GLUCOSE 157* 109*  ?BUN 21 23  ?CREATININE QUANTITY NOT SUFFICIENT, UNABLE TO PERFORM TEST 1.39*  ?CALCIUM 8.3* 7.9*  ?MG  --  1.6*  ? ?GFR: ?Estimated Creatinine Clearance: 39.7 mL/min (A) (by C-G formula based on SCr of 1.39 mg/dL (H)). ?Recent Labs  ?  Lab 01/01/22 ?1118 01/01/22 ?1728 01/01/22 ?1814 01/01/22 ?2010  ?PROCALCITON  --   --  <0.10  --   ?WBC 9.5  --   --   --   ?LATICACIDVEN  --  1.6  --  1.2  ? ? ?Liver Function Tests: ?Recent Labs  ?Lab 01/01/22 ?1318  ?AST 37  ?ALT 17  ?ALKPHOS 44  ?BILITOT 0.9  ?PROT 7.2  ?ALBUMIN 3.4*  ? ?Recent Labs  ?Lab 01/01/22 ?1318  ?LIPASE 25  ? ?No results for input(s): AMMONIA in the last 168 hours. ? ?ABG ?   ?Component Value Date/Time  ? HCO3 20.9 11/02/2020 2005  ? TCO2 22 11/02/2020 2005  ? ACIDBASEDEF 3.0 (H) 11/02/2020 2005  ? O2SAT 97.0 11/02/2020 2005  ?  ? ?Coagulation Profile: ?Recent Labs  ?Lab 01/01/22 ?1814  ?INR 1.2  ? ? ?Cardiac Enzymes: ?Recent Labs  ?Lab 01/01/22 ?1714  ?CKTOTAL 1,187*  ? ? ?HbA1C: ?Hemoglobin A1C  ?Date/Time  Value Ref Range Status  ?12/05/2019 12:00 AM 8.6  Final  ?08/21/2019 12:00 AM 7.9  Final  ? ?Hgb A1c MFr Bld  ?Date/Time Value Ref Range Status  ?10/25/2021 05:05 PM 7.2 (H) 4.8 - 5.6 % Final  ?  Comment:

## 2022-01-01 NOTE — ED Notes (Addendum)
This RN among to two other nurses attempted to get blood from pt with straight needle. Rns unable to.  ?

## 2022-01-01 NOTE — ED Triage Notes (Signed)
Pt arrived to ED from [place] [facility]. Pt was found on the floor by a neighbor in the bathroom, pt is covered in his own vomit. Pt last talked to his daughter last night at 2100 and he told EMS that he did get out of bed this AM. Pt has right sided deficits from a previous stroke. Pt is resting on ED stretcher, Pt is A/Ox4, NAD ?

## 2022-01-01 NOTE — Consult Note (Signed)
Pharmacy Antibiotic Note ? 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 Pharmacy has been consulted for vancomycin and cefepime dosing. ? ?Plan: ?Vancomycin 2000 mg IV loading dose, followed by 1000 mg IV q24h ?Goal AUC 400-550  ?Est AUC: 463.5 ?Est Cmax: 28.2 ?Est Cmin: 12.9 ?Calculated with SCr 1.39, Vd 0.72 ? ?Cefepime 2 g IV q12h per indication and renal function ? ?Pt also on azithromycin 500 mg IV q24h and Paxlovid  ? ?Monitor clinical picture, renal function, and vancomycin levels at steady state ?F/U C&S, abx deescalation / LOT ? ? ?Weight: 88.4 kg (194 lb 14.2 oz) ? ?Temp (24hrs), Avg:99.4 ?F (37.4 ?C), Min:98.6 ?F (37 ?C), Max:100.1 ?F (37.8 ?C) ? ?Recent Labs  ?Lab 01/01/22 ?1118 01/01/22 ?1318  ?WBC 9.5  --   ?CREATININE  --  QUANTITY NOT SUFFICIENT, UNABLE TO PERFORM TEST  ?  ?CrCl cannot be calculated (This lab value cannot be used to calculate CrCl because it is not a number: QUANTITY NOT SUFFICIENT, UNABLE TO PERFORM TEST).   ? ?Allergies  ?Allergen Reactions  ? Dulaglutide   ?  Other reaction(s): upset stomach ?Other reaction(s): Abdominal Pain, upset stomach ?Other reaction(s): upset stomach ?  ? Empagliflozin Other (See Comments)  ?  Other reaction(s): yeast infection ?Other reaction(s): Other (See Comments) ?Other reaction(s): yeast infection ?  ? Lisinopril Cough  ?  tachycardia  ? Nexium [Esomeprazole] Other (See Comments)  ?  Headache  ? Oxycodone Other (See Comments)  ?  Even 1/2 tablet of '5mg'$  caused increased confusion - contributory to hospitalization 10/2020 with AMS  ? Pregabalin Other (See Comments) and Nausea Only  ?  Other reaction(s): MALAISE, SLIGHT NAUSEA, SLIGHT HEADACHE ?Other reaction(s): MALAISE, SLIGHT NAUSEA, SLIGHT HEADACHE ?  ? Rosuvastatin Other (See Comments)  ?  Other reaction(s): muscle pain ?2.16.2022 Pt is current on this medication without any issues. ?Other reaction(s): muscle pain ?2.16.2022 Pt is current  on this medication without any issues. ?Other reaction(s): muscle pain  ? ? ?Antimicrobials this admission: ?4/16 vancomycin >>  ?4/16 cefepime >>  ?4/16 azithromycin >> ?4/16 Paxlovid >> ? ?Dose adjustments this admission: ?N/A ? ?Microbiology results: ?4/16 BCx: pending ?4/16 MRSA PCR: positive ? ?Thank you for allowing pharmacy to be a part of this patient?s care. ? ?Darnelle Bos, PharmD ?01/01/2022 5:58 PM ? ?

## 2022-01-01 NOTE — Progress Notes (Signed)
Patient was received from ED around 1700. Pt was drowsy, but responded appropriately to questions. Oriented x 3, except to day and time of day. Given po Midodrine once verified by pharmacy. Pt able to take po midodrine with sips of water. Pt also did experience an episode of increased lethargy, found to have hypoglycemia, which was treated with D50. Pt also given 250 cc NS bolus x 1. Given dexamethasone as ordered, and IV cefipime started. Pt able to move all extremities but right arm weaker than left. He reports a history of stroke, and uses walker at home. Skin intact except for abrasions x 2 on right upper arm, that are scabbed. Lower extremities and hands appear mottled. Intermittently tachypneic, RR 18-30, with O2 sats 99-100 on 1 liter O2. Dr. Lanney Gins was updated as reqiested regarding patient's improved BP after Midodrine.  ?

## 2022-01-01 NOTE — Assessment & Plan Note (Addendum)
-   Home long-acting insulin glargine 20 units nightly and insulin sliding scale has not been resumed due to presentation of syncope with possible hypoglycemia ?- Hospital Insulin SSI with at bedtime coverage ordered, with 3 times daily AC glucose monitoring ordered ?- Goal inpatient blood glucose levels 140-180 ?Consult diabetic nurse ?

## 2022-01-01 NOTE — Assessment & Plan Note (Addendum)
-   Likely due to hypoglycemia.  Less likely from sinus bradycardia.  TSH within normal limit ?- Complete echo shows normal LV systolic function. MRI of the brain without contrast shows no acute stroke ?- Blood cultures x2 negative thus far ?- Fall precautions, aspiration precautions, strict I's and O's ?-Cardiology seen and recommends 2-week ZIO monitor at discharge. ?

## 2022-01-01 NOTE — Assessment & Plan Note (Addendum)
Transient and now back to normal baseline mental status ?

## 2022-01-01 NOTE — Progress Notes (Signed)
Received 2nd IV consult for midline placement. IV RN day shift assessed pt and recommended PICC or CVC due to no suitable veins noted. Pt has 3 IVs. Spoke with Levada Dy, RN which stated midline was needed for blood work. RN notified that midline is inappropriate for lab draws. Pt needs PICC or CVC. ?

## 2022-01-01 NOTE — Assessment & Plan Note (Addendum)
-   Continue Paxlovid ?- Airborne and contact precautions, aspiration precaution ?Considering procalcitonin is within normal limit I would stop all the antibiotics ?

## 2022-01-01 NOTE — ED Notes (Addendum)
BGL 85 at this time. ?

## 2022-01-01 NOTE — Assessment & Plan Note (Signed)
-   Hydralazine injection 5 mg IV every 6 hours as needed for SBP greater than 170, 3 days ordered ?

## 2022-01-01 NOTE — Assessment & Plan Note (Addendum)
-   Status post dextrose 10 infusion at 750 mL/h, and dextrose 10 infusion at 1500 mL/h IV bolus while in the ED ?- Now blood sugar has been stable and off D10 ?He is eating/drinking ?

## 2022-01-01 NOTE — ED Notes (Signed)
RN was given a verbal order for an I/O straight cath for urine. RN collected 758m as output. ?

## 2022-01-01 NOTE — Progress Notes (Signed)
Received IV consult to place Midline. Assessed left upper arm with ultrasound. No suitable veins found. Recommend PICC or central line. Notified primary RN Levada Dy. ?

## 2022-01-01 NOTE — Hospital Course (Addendum)
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 ? ?Initial vitals in the emergency department showed temperature of 98.9, and increased to 100, respiration rate of 21, heart rate of 79, blood pressure 125/64, SPO2 98% on room air. ? ?Serum sodium is 136, potassium 4.2, chloride 104, bicarb 25, BUN of 21, serum creatinine and GFR quantity not sufficient to determine.  Nonfasting blood glucose 157.  Anion gap was 8.  High sensitive troponin was initially 7 and increased to 9. ? ?TSH was 2.546.  Serum calcium is 8.3. ? ?WBC is 9.5, hemoglobin 14.7, platelets of 148. ? ?UA was negative for leukocytes and nitrates.  UA was positive for greater than 500 of glucose, and hemoglobin showed small. ? ?COVID/influenza A/influenza B PCR resulted and COVID was positive. ? ?ED imaging: ? ?Portable chest x-ray was read as no acute cardiopulmonary abnormality or acute traumatic injury identified. ? ?CT head without contrast was read as no acute traumatic injury identified.  Stable noncontrast CT appearance of the brain with chronic infarct of the left corona radiata and lentiform.  New mild paranasal sinus inflammation, small volume retained secretions in the nasopharynx. ? ?CT chest abdomen and pelvis without contrast was read as no acute traumatic injury identified in the noncontrast chest, abdomen, pelvis.  Improved atelectasis from last month with new bandlike also mildly spiculated opacity in the right middle lobe (12 mm).  Distended urinary bladder (415 mL).  Chronic nephrolithiasis.  Aortic atherosclerosis. ? ?ED treatment: DuoNebs x1, dextrose 10% infusion, sodium  chloride 500 mL bolus, ondansetron 4 mg IV. ? ?4/17: Cardiology recommends conservative management ?

## 2022-01-01 NOTE — Sepsis Progress Note (Signed)
Secured chat communication with the CCM provider this evening regarding sepsis with hypotension, fluids resuscitation needs and the Dgn + Covid. Provider will continue to monitor V/S and give fluids as appropriately  ?

## 2022-01-01 NOTE — ED Notes (Addendum)
BGL 163

## 2022-01-02 ENCOUNTER — Inpatient Hospital Stay (HOSPITAL_COMMUNITY)
Admit: 2022-01-02 | Discharge: 2022-01-02 | Disposition: A | Discharge status: Home / Self Care | Payer: Medicare Other | Attending: Internal Medicine | Admitting: Internal Medicine

## 2022-01-02 ENCOUNTER — Other Ambulatory Visit: Payer: Self-pay

## 2022-01-02 DIAGNOSIS — R55 Syncope and collapse: Secondary | ICD-10-CM

## 2022-01-02 DIAGNOSIS — J189 Pneumonia, unspecified organism: Secondary | ICD-10-CM

## 2022-01-02 DIAGNOSIS — R269 Unspecified abnormalities of gait and mobility: Secondary | ICD-10-CM

## 2022-01-02 DIAGNOSIS — I69398 Other sequelae of cerebral infarction: Secondary | ICD-10-CM

## 2022-01-02 LAB — CBC WITH DIFFERENTIAL/PLATELET
Abs Immature Granulocytes: 0.03 10*3/uL (ref 0.00–0.07)
Basophils Absolute: 0 10*3/uL (ref 0.0–0.1)
Basophils Relative: 0 %
Eosinophils Absolute: 0 10*3/uL (ref 0.0–0.5)
Eosinophils Relative: 0 %
HCT: 39.6 % (ref 39.0–52.0)
Hemoglobin: 12.7 g/dL — ABNORMAL LOW (ref 13.0–17.0)
Immature Granulocytes: 0 %
Lymphocytes Relative: 11 %
Lymphs Abs: 0.8 10*3/uL (ref 0.7–4.0)
MCH: 29.4 pg (ref 26.0–34.0)
MCHC: 32.1 g/dL (ref 30.0–36.0)
MCV: 91.7 fL (ref 80.0–100.0)
Monocytes Absolute: 0.3 10*3/uL (ref 0.1–1.0)
Monocytes Relative: 4 %
Neutro Abs: 6.3 10*3/uL (ref 1.7–7.7)
Neutrophils Relative %: 85 %
Platelets: 141 10*3/uL — ABNORMAL LOW (ref 150–400)
RBC: 4.32 MIL/uL (ref 4.22–5.81)
RDW: 13.9 % (ref 11.5–15.5)
WBC: 7.4 10*3/uL (ref 4.0–10.5)
nRBC: 0 % (ref 0.0–0.2)

## 2022-01-02 LAB — COMPREHENSIVE METABOLIC PANEL
ALT: 18 U/L (ref 0–44)
AST: 39 U/L (ref 15–41)
Albumin: 2.9 g/dL — ABNORMAL LOW (ref 3.5–5.0)
Alkaline Phosphatase: 33 U/L — ABNORMAL LOW (ref 38–126)
Anion gap: 8 (ref 5–15)
BUN: 23 mg/dL (ref 8–23)
CO2: 24 mmol/L (ref 22–32)
Calcium: 7.9 mg/dL — ABNORMAL LOW (ref 8.9–10.3)
Chloride: 104 mmol/L (ref 98–111)
Creatinine, Ser: 1.33 mg/dL — ABNORMAL HIGH (ref 0.61–1.24)
GFR, Estimated: 51 mL/min — ABNORMAL LOW (ref >60)
Glucose, Bld: 190 mg/dL — ABNORMAL HIGH (ref 70–99)
Potassium: 4.6 mmol/L (ref 3.5–5.1)
Sodium: 136 mmol/L (ref 135–145)
Total Bilirubin: 0.7 mg/dL (ref 0.3–1.2)
Total Protein: 6.3 g/dL — ABNORMAL LOW (ref 6.5–8.1)

## 2022-01-02 LAB — ECHOCARDIOGRAM COMPLETE
AR max vel: 1.53 cm2
AV Area VTI: 1.9 cm2
AV Area mean vel: 1.59 cm2
AV Mean grad: 3 mmHg
AV Peak grad: 5.9 mmHg
Ao pk vel: 1.21 m/s
Area-P 1/2: 3.46 cm2
Height: 68 in
MV VTI: 1.41 cm2
S' Lateral: 2.6 cm
Weight: 3054.69 oz

## 2022-01-02 LAB — GLUCOSE, CAPILLARY
Glucose-Capillary: 164 mg/dL — ABNORMAL HIGH (ref 70–99)
Glucose-Capillary: 150 mg/dL — ABNORMAL HIGH (ref 70–99)
Glucose-Capillary: 175 mg/dL — ABNORMAL HIGH (ref 70–99)
Glucose-Capillary: 263 mg/dL — ABNORMAL HIGH (ref 70–99)
Glucose-Capillary: 234 mg/dL — ABNORMAL HIGH (ref 70–99)
Glucose-Capillary: 233 mg/dL — ABNORMAL HIGH (ref 70–99)
Glucose-Capillary: 284 mg/dL — ABNORMAL HIGH (ref 70–99)

## 2022-01-02 LAB — PHOSPHORUS: Phosphorus: 4.2 mg/dL (ref 2.5–4.6)

## 2022-01-02 LAB — MAGNESIUM: Magnesium: 2.7 mg/dL — ABNORMAL HIGH (ref 1.7–2.4)

## 2022-01-02 LAB — PROCALCITONIN: Procalcitonin: <0.1 ng/mL

## 2022-01-02 MED ORDER — BRIMONIDINE TARTRATE 0.2 % OP SOLN
1.0000 [drp] | Freq: Three times a day (TID) | OPHTHALMIC | Status: DC
  Administered 2022-01-02 – 2022-01-03 (×2): 1 [drp] via OPHTHALMIC

## 2022-01-02 MED ORDER — CHLORHEXIDINE GLUCONATE CLOTH 2 % EX PADS
6.0000 | MEDICATED_PAD | Freq: Every day | CUTANEOUS | Status: DC
  Administered 2022-01-02 – 2022-01-03 (×2): 6 via TOPICAL

## 2022-01-02 MED ORDER — NYSTATIN 100000 UNIT/GM EX POWD
Freq: Two times a day (BID) | CUTANEOUS | Status: DC
  Filled 2022-01-02: qty 15

## 2022-01-02 MED ORDER — BRIMONIDINE TARTRATE 0.2 % OP SOLN
1.0000 [drp] | Freq: Two times a day (BID) | OPHTHALMIC | Status: DC
  Administered 2022-01-02: 1 [drp] via OPHTHALMIC
  Filled 2022-01-02: qty 5

## 2022-01-02 MED ORDER — SODIUM CHLORIDE 0.9 % IV SOLN: INTRAVENOUS | Status: DC

## 2022-01-02 MED ORDER — ORAL CARE MOUTH RINSE
15.0000 mL | Freq: Two times a day (BID) | OROMUCOSAL | Status: DC
  Administered 2022-01-02 – 2022-01-03 (×4): 15 mL via OROMUCOSAL

## 2022-01-02 MED ORDER — INSULIN ASPART 100 UNIT/ML IJ SOLN
0.0000 [IU] | INTRAMUSCULAR | Status: DC
  Administered 2022-01-02: 5 [IU] via SUBCUTANEOUS
  Administered 2022-01-02: 8 [IU] via SUBCUTANEOUS
  Administered 2022-01-02: 5 [IU] via SUBCUTANEOUS
  Administered 2022-01-02: 3 [IU] via SUBCUTANEOUS
  Administered 2022-01-03: 8 [IU] via SUBCUTANEOUS
  Administered 2022-01-03: 3 [IU] via SUBCUTANEOUS
  Administered 2022-01-03 (×2): 2 [IU] via SUBCUTANEOUS
  Filled 2022-01-02 (×8): qty 1

## 2022-01-02 MED ORDER — TIMOLOL MALEATE 0.5 % OP SOLN
1.0000 [drp] | Freq: Two times a day (BID) | OPHTHALMIC | Status: DC
  Administered 2022-01-02 (×2): 1 [drp] via OPHTHALMIC
  Filled 2022-01-02: qty 5

## 2022-01-02 MED ORDER — CHLORHEXIDINE GLUCONATE 0.12 % MT SOLN
15.0000 mL | Freq: Two times a day (BID) | OROMUCOSAL | Status: DC
  Administered 2022-01-02 – 2022-01-03 (×3): 15 mL via OROMUCOSAL
  Filled 2022-01-02 (×3): qty 15

## 2022-01-02 MED ORDER — MIDODRINE HCL 5 MG PO TABS
10.0000 mg | ORAL_TABLET | Freq: Three times a day (TID) | ORAL | Status: DC
  Administered 2022-01-02 – 2022-01-03 (×5): 10 mg via ORAL
  Filled 2022-01-02 (×5): qty 2

## 2022-01-02 NOTE — Consult Note (Addendum)
?Cardiology Consultation:  ? ?Patient ID: James Spurr, MD ?MRN: [id number]; DOB: 1931-08-30 ? ?Admit date: 01/01/2022 ?Date of Consult: 01/02/2022 ? ?PCP:  [name], MD ?  ?[facility] ?Cardiologist: New ? ?Patient Profile:  ? 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 ? ?History of Present Illness:  ? ?Mr. Faivre has not been see by cardiology in the past. No prior heart attack or stents. No alcohol, drug, or tobacco history. Lives by himself, has someone come help a couple days a week.  ? ?The patient presented for syncope. Does not remember any prodromal symptoms. He remembers waking up and feeling groggy, other notes said he vomited.  No injury noted, he did not hit his head. No chest pain or SOB. Has dependent edema.  ? ?In the ER BP 127/75, pulse 96bpm, 100F, 99%O2. Labs showed plt 148, glucose <50 started on dextrose. Na 136, potassium 4.2, chloride 104, bicarb 25, HS trop 7>9. TSH wnl. WBC 9.5, Hgb 14.7, UA with leukocytes and nitrates. COVID positive. CXR showed no acute abnormality. CT head no acute abnormality. CT chest/abd/pelvis with no acute injury. The patient was started on abx and admitted.  ? ?Heart rates was noted to be bradycardic into upper 30s and 40s with pauses, longest 2.76 seconds. Patient denies dizziness or lightheadedness. He is not on rate lowering medications.  ? ?Past Medical History:  ?Diagnosis Date  ? Anemia   ? Basal cell carcinoma 2018  ? R temple   ? Basal cell carcinoma of nose 2002  ? bridge of nose  ? BPH with obstruction/lower urinary tract symptoms   ? failed flomax, uroxatral, myrbetriq - sees urology [name]) pending  videourodynamics  ? CKD (chronic kidney disease) stage 3, GFR 30-59 ml/min (HCC) 03/11/2012  ? Renal US 02/2014 - multiple kidney cysts   ? CVA (cerebral infarction) 03/07/2012  ? slurred speech; right hemplegia, left handed - completed PT 07/2014 (17 sessions)  ? Dehydration   ? Depression   ? mild, on cymbalta per prior PCP  ? Erectile dysfunction   ? per urology (prostaglandin injection)  ? GERD (gastroesophageal reflux disease)   ? with sliding HH  ? H/O hiatal hernia   ? History of kidney problems 12/2010  ? lacerated left kidney after fall  ? HLD (hyperlipidemia)   ? Hx of basal cell carcinoma 07/10/2017  ? R. lat. forehead near hair line  ? Kidney cysts 02/2014  ? bilateral (renal US)  ? Migraines 03/11/2012  ? now rare  ? OSA on CPAP   ? sleep study 01/2014 - severe, CPAP at 10, previously on nuvigil (Dr. [name])  ? Palpitations 01/2012  ? holter WNL, rare PAC/PVCs  ? Presbycusis of both ears 2016  ? rec amplification [facility])  ? Restless leg syndrome   ? well controlled on mirapex  ? Rosacea   ? [name]  ? Stroke [facility])   ? TIA (transient ischemic attack)   ? Type 2 diabetes, controlled, with neuropathy ([place]) 2011  ? [name] in [place]  ? ? ?Past Surgical History:  ?Procedure Laterality Date  ? ABI  2007  ? WNL  ? CARDIAC CATHETERIZATION  2004  ?  normal; [place]  ? CARDIOVASCULAR STRESS TEST  02/2014  ? WNL, low risk scan, EF 68%  ? CAROTID ENDARTERECTOMY    ? CATARACT EXTRACTION W/ INTRAOCULAR LENS IMPLANT  11//2012 - 08/2011  ? COLONOSCOPY  03/2011  ? poor prep, unable to biopsy polyp in tic fold, rpt 3 mo [name])  ? COLONOSCOPY  06/2011  ? mult polyps adenomatous, severe diverticulosis, rpt 3 yrs [name])  ? CYSTOSCOPY WITH URETHRAL DILATATION N/A 07/24/2013  ? Procedure: CYSTOSCOPY WITH URETHRAL DILATATION;  Surgeon: [name], MD;  Location: [facility];  Service: Urology;  Laterality: N/A;  ? ESOPHAGOGASTRODUODENOSCOPY  2006  ? duodenitis, medual HH, Grade 2 reflux, mild gastritis  ?  ESOPHAGOGASTRODUODENOSCOPY  07/2011  ? mild chronic gastritis  ? [place]  ? [place]; 2002  ? left, then repaired  ? MRI brain  04/2010  ? WNL  ? PROSTATE SURGERY  02/2009  ? PVP (laser)  ? SKIN CANCER EXCISION  2002  ? bridge of nose, BCC  ? TONSILLECTOMY AND ADENOIDECTOMY  ~ 1945  ? TRANSURETHRAL INCISION OF PROSTATE N/A 07/24/2013  ? Procedure: TRANSURETHRAL INCISION OF THE PROSTATE (TUIP);  Surgeon: [name], MD;  Location: [facility];  Service: Urology;  Laterality: N/A;  ? URETHRAL DILATION  2014  ? [place]  ?  ? ?Home Medications:  ?Prior to Admission medications   ?Medication Sig Start Date End Date Taking? Authorizing Provider  ?acetaminophen (TYLENOL) 325 MG tablet Take 2 tablets (650 mg total) by mouth every 6 (six) hours as needed for mild pain (or Fever >/= 101). 11/02/21  Yes Eugenie Filler, MD  ?Alpha-Lipoic Acid 600 MG CAPS Take 1 capsule (600 mg total) by mouth daily. 06/28/21  Yes [name], MD  ?brimonidine-timolol (COMBIGAN) 0.2-0.5 % ophthalmic solution Place 1 drop into both eyes every 12 (twelve) hours.   Yes [provider]  ?clopidogrel (PLAVIX) 75 MG tablet TAKE 1 TABLET DAILY 01/11/21  Yes [name], MD  ?Coenzyme Q10 (COQ10) 150 MG CAPS Take 1 capsule by mouth daily.   Yes [provider]  ?DULoxetine (CYMBALTA) 60 MG capsule Take 1 capsule (60 mg total) by mouth daily. 07/27/21  Yes [name], MD  ?famotidine (PEPCID) 20 MG tablet Take 1 tablet (20 mg total) by mouth at bedtime. 04/05/21  Yes Hosie Poisson, MD  ?gabapentin (NEURONTIN) 300 MG capsule Take 2 capsules (600 mg total) by mouth 3 (three) times daily. 600 mg BID is his minimum dose without having breakthrough symptoms. He is aware 900 mg/day is recommended per kidney function. 11/24/21  Yes Hongalgi, Lenis Dickinson, MD  ?insulin aspart (NOVOLOG) 100 UNIT/ML injection CBG 70 - 120: 0 units  ?CBG 121 - 150: 1 unit  ?CBG 151 - 200: 2 units  ?CBG 201 -  250: 3 units ? CBG 251 - 300: 5 units  ?CBG 301 - 350: 7 units  ?CBG 351 - 400: 9 units 04/05/21  Yes Hosie Poisson, MD  ?levothyroxine (SYNTHROID) 50 MCG tablet Take 1 tablet (50 mcg total) by mouth daily before breakfast. Per endo [name] 12/07/19  Yes [name], MD  ?melatonin 5 MG TABS Take 5 mg by mouth at bedtime.   Yes [provider]  ?nitroGLYCERIN (NITROSTAT) 0.4 MG SL tablet DISSOLVE 1 TABLET UNDER THE TONGUE EVERY 5 MINUTES AS NEEDED FOR CHEST PAIN 07/28/16  Yes [name], MD  ?ondansetron (ZOFRAN) 4 MG tablet Take 1 tablet (4 mg total)  by mouth every 8 (eight) hours as needed for nausea or vomiting. 12/13/21  Yes [name], MD  ?pramipexole (MIRAPEX) 1 MG tablet Take 1 tablet (1 mg total) by mouth daily. At 4pm 07/27/21  Yes [name], MD  ?RABEprazole (ACIPHEX) 20 MG tablet Take 1 tablet (20 mg total) by mouth daily. 12/13/21  Yes [name], MD  ?sodium chloride (MURO 128) 5 % ophthalmic solution 1 drop as needed for eye irritation.   Yes [provider]  ?vitamin C (ASCORBIC ACID) 500 MG tablet Take 1,000 mg by mouth daily as needed.   Yes [provider]  ?insulin glargine (LANTUS) 100 UNIT/ML injection Inject 0.2 mLs (20 Units total) into the skin at bedtime. ?Patient taking differently: Inject 20 Units into the skin at bedtime. Injects 30 units daily at bedtime 11/02/21   Eugenie Filler, MD  ?Insulin Pen Needle (ULTICARE MICRO PEN NEEDLES) 31G X 8 MM MISC Use to inject medication daily 07/27/21   [name], MD  ?polyethylene glycol (MIRALAX / GLYCOLAX) 17 g packet Take 17 g by mouth daily as needed. 04/05/21   Hosie Poisson, MD  ?tamsulosin (FLOMAX) 0.4 MG CAPS capsule Take 1 capsule (0.4 mg total) by mouth daily after breakfast. ?Patient not taking: Reported on 12/13/2021 07/27/21   [name], MD  ? ? ?Inpatient Medications: ?Scheduled Meds: ? vitamin C  1,000 mg Oral Daily  ? brimonidine  1 drop Both Eyes BID  ?  chlorhexidine  15 mL Mouth Rinse BID  ? Chlorhexidine Gluconate Cloth  6 each Topical Daily  ? clopidogrel  75 mg Oral Daily  ? dexamethasone (DECADRON) injection  6 mg Intravenous Q24H  ? DULoxetine  60 mg Oral Daily  ? en

## 2022-01-02 NOTE — TOC Initial Note (Signed)
Transition of Care (TOC) - Initial/Assessment Note  ? ? ?Patient Details  ?Name: James Spurr, MD ?MRN: [id number] ?Date of Birth: [birth year] ? ?Transition of Care (TOC) CM/SW Contact:    ?Candie Chroman, LCSW ?Phone Number: ?01/02/2022, 2:17 PM ? ?Clinical Narrative:  Readmission prevention screen complete. Unable to enter data at this time. CSW called patient in the room, introduced role, and explained that discharge planning would be discussed. PCP is [name], MD at [facility]. Patient drives sometimes, other times he gets a ride. Pharmacy is [facility]. When needed uses [facility] on corner of [address]. No issues obtaining medications. Patient will resume home health services with [facility] at discharge. He has a rollator, lift chair, bedside commode, and shower chair at home. No further concerns. CSW encouraged patient to contact CSW as needed. CSW will continue to follow patient for support and facilitate return to [place] at discharge.               ? ?Expected Discharge Plan: [place] ?Barriers to Discharge: Continued Medical Work up ? ? ?Patient Goals and CMS Choice ?  ?  ?  ? ?Expected Discharge Plan and Services ?Expected Discharge Plan: [place] ?  ?  ?Post Acute Care Choice: Resumption of Svcs/PTA Provider ?Living arrangements for the past 2 months: [place] (with home health) ?                ?  ?  ?  ?  ?  ?HH Arranged: RN ?[place] Agency: [place] ?Date HH Agency Contacted: 01/02/22 ?  ?Representative spoke with at [place]: [name] ? ?Prior Living Arrangements/Services ?Living arrangements for the past 2 months: [place] (with home health) ?Lives with:: Facility Resident ?Patient language and need for interpreter reviewed:: Yes ?Do you feel safe going back to the place where you live?: Yes      ?Need for Family Participation in Patient  Care: Yes (Comment) ?Care giver support system in place?: Yes (comment) ?Current home services: DME, Home RN ?Criminal Activity/Legal Involvement Pertinent to Current Situation/Hospitalization: No - Comment as needed ? ?Activities of Daily Living ?Home Assistive Devices/Equipment: CPAP, Eyeglasses, Hearing aid, [place] Rile (specify type) ?ADL Screening (condition at time of admission) ?Patient's cognitive ability adequate to safely complete daily activities?: Yes ?Is the patient deaf or have difficulty hearing?: Yes ?Does the patient have difficulty seeing, even when wearing glasses/contacts?: No ?Does the patient have difficulty concentrating, remembering, or making decisions?: No ?Patient able to express need for assistance with ADLs?: Yes ?Does the patient have difficulty dressing or bathing?: Yes ?Independently performs ADLs?: No ?Communication: Independent ?Dressing (OT): Needs assistance ?Is this a change from baseline?: Pre-admission baseline ?Grooming: Independent ?Feeding: Independent ?Bathing: Needs assistance ?Is this a change from baseline?: Pre-admission baseline ?Toileting: Needs assistance ?Is this a change from baseline?: Pre-admission baseline ?In/Out Bed: Needs assistance ?Is this a change from baseline?: Pre-admission baseline ?Walks in Home: Needs assistance ?Is this a change from baseline?: Pre-admission baseline ?Does the patient have difficulty walking or climbing stairs?: Yes ?Weakness of Legs: Right ?Weakness of Arms/Hands: Right ? ?Permission Sought/Granted ?Permission sought to share information with : Customer service manager ?Permission granted to share information with : Yes, Verbal Permission Granted ?   ? Permission granted to share info w AGENCY: [place] ?   ?   ? ?Emotional Assessment ?Appearance:: Appears stated age ?  Attitude/Demeanor/Rapport: Engaged, Gracious ?Affect (typically observed): Accepting, Appropriate, Calm, Pleasant ?Orientation: : Oriented to Self,  Oriented to Place, Oriented to  Time, Oriented to Situation ?Alcohol / Substance Use: Not Applicable ?Psych Involvement: No (comment) ? ?Admission diagnosis:  Syncope [R55] ?Altered mental status [R41.82] ?Hypoglycemia [E16.2] ?Nausea and vomiting, unspecified vomiting type [R11.2] ?COVID-19 [U07.1] ?Patient Active Problem List  ? Diagnosis Date Noted  ? Syncope and collapse 01/01/2022  ? Insulin dependent type 2 diabetes mellitus ([place]) 01/01/2022  ? Insomnia 01/01/2022  ? Hypoglycemia 01/01/2022  ? Altered mental status 01/01/2022  ? Pneumonia due to COVID-19 virus 01/01/2022  ? Non-traumatic rhabdomyolysis   ? Aspiration pneumonia ([place]) 11/23/2021  ? Hypomagnesemia 11/23/2021  ? C. difficile colitis   ? Postural dizziness with presyncope 10/25/2021  ? Abrasion of heel, infected, left, initial encounter 10/25/2021  ? Cellulitis of left foot 10/25/2021  ? General weakness 05/12/2021  ? Right medial knee pain 05/12/2021  ? Frequent UTI 03/31/2021  ? Polypharmacy 03/10/2021  ? Acute encephalopathy 11/02/2020  ? Nausea and vomiting 10/18/2020  ? Healthcare maintenance 06/01/2020  ? Involuntary movements 04/25/2020  ? Post-nasal drainage 07/16/2019  ? Recurrent productive cough 05/31/2019  ? Hx of basal cell carcinoma 07/10/2017  ? Rosacea   ? Gait disturbance, post-stroke 10/05/2015  ? Presbycusis of both ears   ? Advanced care planning/counseling discussion 06/23/2015  ? Impaired mobility 04/23/2015  ? Hearing loss of right ear due to cerumen impaction 03/23/2015  ? HTN (hypertension) 03/18/2015  ? Diastolic dysfunction 16/06/9603  ? Spastic hemiparesis affecting dominant side ([place]) 07/07/2014  ? Chest pressure 02/03/2014  ? Medicare annual wellness visit, subsequent 11/06/2013  ? Erectile dysfunction due to arterial insufficiency 02/05/2013  ? MDD (major depressive disorder), recurrent episode, moderate ([place])   ? Palpitations 06/26/2012  ? Hyperlipidemia associated with type 2 diabetes mellitus ([place])   ? Restless leg  syndrome, severe   ? GERD (gastroesophageal reflux disease)   ? BPH with obstruction/lower urinary tract symptoms   ? History of CVA (cerebrovascular accident) 03/11/2012  ? Hemiparesis affecting right side as late effect of cerebrovascular accident (CVA) ([place]) 03/11/2012  ? Uncontrolled type 2 diabetes mellitus with hyperglycemia, with long-term current use of insulin ([place]) 03/11/2012  ? OSA on CPAP 03/11/2012  ? Chronic kidney disease, stage 3a ([place]) 03/11/2012  ? Migraines 03/11/2012  ? Thrombocytopenia ([place]) 2011  ? Cause of injury, fall 2011  ? ?PCP:  [name], MD ?Pharmacy:   ?[facility] [place] ?[address] ?[place] [address] ?Phone: [phone] Fax: [phone] ? ?[facility] - [place], [name] ?[address] ?[name] [address] ?Phone: [phone] Fax: [phone] ? ?[id number] - [address], [place] ?[place] ?[place] [address] ?Phone: [phone] Fax: [phone] ? ? ? ? ?Social Determinants of Health (SDOH) Interventions ?  ? ?Readmission Risk Interventions ?   ? View : No data to display.  ?  ?  ?  ? ? ? ?

## 2022-01-02 NOTE — TOC CM/SW Note (Signed)
Patient on airborne isolation precautions. Tried calling in the room for readmission prevention screen but no answer. Will try again later.. Patient is from [place]. Went to [facility] 3/9-3/23. Is active with [facility]. Representative is confirming which services he is receiving. ? ?James Bell, [place] ?[phone] ? ?

## 2022-01-02 NOTE — Progress Notes (Signed)
Vital signs reviewed, ICU needs resolved ?BP stable, no oxygen needed at this time ? ? ?Will sign off at this time. No further recommendations at this time. ? ? ?Corrin Parker, M.D.  ?[facility]  ?Medical Director [place] ?Medical Director [facility] Cardio-Pulmonary Department  ? ?

## 2022-01-02 NOTE — Assessment & Plan Note (Signed)
Continue IV hydration and monitor CK ?

## 2022-01-02 NOTE — Evaluation (Signed)
Physical Therapy Evaluation ?Patient Details ?Name: James Spurr, MD ?MRN: [id number] ?DOB: 03/10/1931 ?Today's Date: 01/02/2022 ? ?History of Present Illness ? Dr. Zohaib Heeney is a 88 year old male with history of insulin-dependent diabetes mellitus, hypothyroid, OSA on CPAP, history of CVA, CKD stage IIIa, GERD, BPH, history of right-sided hemiparesis presumed secondary to late progression from CVA, with dominant side mastic hemiparesis, generalized weakness, hypomagnesemia, who presents emergency department for chief concerns of being found down/syncope. ?  ?Clinical Impression ? Patient received in bed, he is agreeable to PT assessment. Patient pleasant and performed bed mobility with mod independence and is able to stand with min A and able to take a few steps along edge of bed with min guard and RW. Patient should progress well. He will continue to benefit from skilled PT while here to improve functional independence and safety.     ?   ? ?Recommendations for follow up therapy are one component of a multi-disciplinary discharge planning process, led by the attending physician.  Recommendations may be updated based on patient status, additional functional criteria and insurance authorization. ? ?Follow Up Recommendations Home health PT ? ?  ?Assistance Recommended at Discharge Intermittent Supervision/Assistance  ?Patient can return home with the following ? A little help with walking and/or transfers;A little help with bathing/dressing/bathroom ? ?  ?Equipment Recommendations None recommended by PT  ?Recommendations for Other Services ?    ?  ?Functional Status Assessment Patient has had a recent decline in their functional status and demonstrates the ability to make significant improvements in function in a reasonable and predictable amount of time.  ? ?  ?Precautions / Restrictions Precautions ?Precautions: Fall ?Restrictions ?Weight Bearing Restrictions: No  ? ?  ? ?Mobility ? Bed Mobility ?Overal  bed mobility: Modified Independent ?  ?  ?  ?  ?  ?  ?General bed mobility comments: was able to sit up at edge of bed with mod independence. Use of rails ?  ? ?Transfers ?Overall transfer level: Needs assistance ?Equipment used: Rolling walker (2 wheels) ?Transfers: Sit to/from Stand ?Sit to Stand: Min guard ?  ?  ?  ?  ?  ?  ?  ? ?Ambulation/Gait ?Ambulation/Gait assistance: Min guard ?Gait Distance (Feet): 3 Feet ?Assistive device: Rolling walker (2 wheels) ?Gait Pattern/deviations: Step-to pattern, Decreased step length - right, Decreased step length - left ?Gait velocity: decr ?  ?  ?General Gait Details: patient able to side step at edge of bed with min guard, RW ? ?Stairs ?  ?  ?  ?  ?  ? ?Wheelchair Mobility ?  ? ?Modified Rankin (Stroke Patients Only) ?  ? ?  ? ?Balance Overall balance assessment: Needs assistance ?Sitting-balance support: Feet supported ?Sitting balance-Leahy Scale: Good ?  ?  ?Standing balance support: Bilateral upper extremity supported, During functional activity, Reliant on assistive device for balance ?Standing balance-Leahy Scale: Fair ?  ?  ?  ?  ?  ?  ?  ?  ?  ?  ?  ?  ?   ? ? ? ?Pertinent Vitals/Pain Pain Assessment ?Pain Assessment: No/denies pain  ? ? ?Home Living Family/patient expects to be discharged to:: Assisted living ?  ?  ?  ?  ?  ?  ?  ?  ?Home Equipment: Rollator (4 wheels);Rolling Walker (2 wheels);Other (comment) ?Additional Comments: Lives at [facility])  ?  ?Prior Function Prior Level of Function : Needs assist ?  ?  ?  ?  ?  ?  ?  Mobility Comments: Ambulatory with rollator ?ADLs Comments: patient states he walks with walker, some and has motorized scoooter. ?  ? ? ?Hand Dominance  ? Dominant Hand: Left ? ?  ?Extremity/Trunk Assessment  ? Upper Extremity Assessment ?Upper Extremity Assessment: RUE deficits/detail ?RUE Deficits / Details: prior CVA ?  ? ?Lower Extremity Assessment ?Lower Extremity Assessment: Overall WFL for tasks assessed ?  ? ?Cervical /  Trunk Assessment ?Cervical / Trunk Assessment: Normal  ?Communication  ? Communication: HOH  ?Cognition Arousal/Alertness: Awake/alert ?Behavior During Therapy: [facility] for tasks assessed/performed ?Overall Cognitive Status: Within Functional Limits for tasks assessed ?  ?  ?  ?  ?  ?  ?  ?  ?  ?  ?  ?  ?  ?  ?  ?  ?  ?  ?  ? ?  ?General Comments   ? ?  ?Exercises    ? ?Assessment/Plan  ?  ?PT Assessment Patient needs continued PT services  ?PT Problem List Decreased strength;Decreased mobility;Decreased activity tolerance;Decreased balance ? ?   ?  ?PT Treatment Interventions Therapeutic exercise;DME instruction;Gait training;Functional mobility training;Therapeutic activities;Patient/family education;Balance training   ? ?PT Goals (Current goals can be found in the Care Plan section)  ?Acute Rehab PT Goals ?Patient Stated Goal: to return to [facility] ?PT Goal Formulation: With patient ?Time For Goal Achievement: 01/15/22 ?Potential to Achieve Goals: Good ? ?  ?Frequency Min 2X/week ?  ? ? ?Co-evaluation   ?  ?  ?  ?  ? ? ?  ?AM-PAC PT "6 Clicks" Mobility  ?Outcome Measure Help needed turning from your back to your side while in a flat bed without using bedrails?: A Little ?Help needed moving from lying on your back to sitting on the side of a flat bed without using bedrails?: A Little ?Help needed moving to and from a bed to a chair (including a wheelchair)?: A Little ?Help needed standing up from a chair using your arms (e.g., wheelchair or bedside chair)?: A Little ?Help needed to walk in hospital room?: A Lot ?Help needed climbing 3-5 steps with a railing? : Total ?6 Click Score: 15 ? ?  ?End of Session   ?Activity Tolerance: Patient tolerated treatment well;Patient limited by lethargy ?Patient left: in bed;with call bell/phone within reach ?Nurse Communication: Mobility status ?PT Visit Diagnosis: Other abnormalities of gait and mobility (R26.89);Muscle weakness (generalized) (M62.81);Difficulty in walking, not  elsewhere classified (R26.2) ?  ? ?Time: 5102-5852 ?PT Time Calculation (min) (ACUTE ONLY): 27 min ? ? ?Charges:   PT Evaluation ?$PT Eval Moderate Complexity: 1 Mod ?PT Treatments ?$Gait Training: 8-22 mins ?  ?   ? ? ?[employer], PT, [employer] ?01/02/22,11:56 AM ? ?

## 2022-01-02 NOTE — Progress Notes (Signed)
?Progress Note ? ? ?Patient: James PEAKE, MD VOH:[id number] DOB: [birth year] DOA: 01/01/2022     1 ?DOS: the patient was seen and examined on 01/02/2022 ?  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 ? ?Initial vitals in the emergency department showed temperature of 98.9, and increased to 100, respiration rate of 21, heart rate of 79, blood pressure 125/64, SPO2 98% on room air. ? ?Serum sodium is 136, potassium 4.2, chloride 104, bicarb 25, BUN of 21, serum creatinine and GFR quantity not sufficient to determine.  Nonfasting blood glucose 157.  Anion gap was 8.  High sensitive troponin was initially 7 and increased to 9. ? ?TSH was 2.546.  Serum calcium is 8.3. ? ?WBC is 9.5, hemoglobin 14.7, platelets of 148. ? ?UA was negative for leukocytes and nitrates.  UA was positive for greater than 500 of glucose, and hemoglobin showed small. ? ?COVID/influenza A/influenza B PCR resulted and COVID was positive. ? ?ED imaging: ? ?Portable chest x-ray was read as no acute cardiopulmonary abnormality or acute traumatic injury identified. ? ?CT head without contrast was read as no acute traumatic injury identified.  Stable noncontrast CT appearance of the brain with chronic infarct of the left corona radiata and lentiform.  New mild paranasal sinus inflammation, small volume retained secretions in the nasopharynx. ? ?CT chest abdomen and pelvis without contrast was read as no acute traumatic injury identified in the noncontrast chest, abdomen, pelvis.  Improved atelectasis from last month with new bandlike also mildly spiculated opacity in  the right middle lobe (12 mm).  Distended urinary bladder (415 mL).  Chronic nephrolithiasis.  Aortic atherosclerosis. ? ?ED treatment: DuoNebs x1, dextrose 10% infusion, sodium chloride 500 mL bolus, ondansetron 4 mg IV. ? ?4/17: Cardiology recommends conservative management ? ? ?Assessment and Plan: ?* Syncope and collapse ?- Likely due to hypoglycemia.  Less likely from sinus bradycardia.  TSH within normal limit ?- Complete echo shows normal LV systolic function. MRI of the brain without contrast shows no acute stroke ?- Blood cultures x2 negative thus far ?- Fall precautions, aspiration precautions, strict I's and O's ?-Cardiology seen and recommends 2-week ZIO monitor at discharge. ? ?Non-traumatic rhabdomyolysis ?Continue IV hydration and monitor CK ? ?Pneumonia due to COVID-19 virus ?- Continue Paxlovid ?- Airborne and contact precautions, aspiration precaution ?Considering procalcitonin is within normal limit I would stop all the antibiotics ? ?Altered mental status ?Transient and now back to normal baseline mental status ? ?Hypoglycemia ?- Status post dextrose 10 infusion at 750 mL/h, and dextrose 10 infusion at 1500 mL/h IV bolus while in the ED ?- Now blood sugar has been stable and off D10 ?He is eating/drinking ? ?Insomnia ?Continue melatonin 5 mg nightly as needed for sleep  ? ?Insulin dependent type 2 diabetes mellitus ([place]) ?- Home long-acting insulin glargine 20 units nightly and insulin sliding scale has not been resumed due to presentation of syncope with possible hypoglycemia ?- Hospital Insulin SSI with at bedtime coverage ordered, with 3 times daily AC glucose monitoring ordered ?- Goal inpatient blood glucose levels 140-180 ?Consult diabetic nurse ? ?HTN (hypertension) ?- Hydralazine injection 5 mg IV every 6 hours as needed for SBP greater than 170, 3  days ordered ? ?MDD (major depressive disorder), recurrent episode, moderate ([address]) ?Continue duloxetine 60 mg daily ? ?BPH with  obstruction/lower urinary tract symptoms ?- Per med reconciliation patient is no longer taking Flomax ?- Given distended bladder on CT imaging, I have ordered Flomax daily on admission, 2 doses ordered ? ?GERD (gastroesophageal reflux disease) ?- Continue Home famotidine 20 mg nightly  ? ?Restless leg syndrome, severe ?Continue home gabapentin 600 mg p.o. 3 times daily ? ? ? ? ?  ? ?Subjective: Feeling better.  Denies any new signs/symptoms.  Would like to get his medications at the scheduled time ? ?Physical Exam: ?Vitals:  ? 01/02/22 0900 01/02/22 1300 01/02/22 1649 01/02/22 2018  ?BP: (!) 117/55 (!) 122/53 (!) 145/64 (!) 179/73  ?Pulse: 64 (!) 50 (!) 50 (!) 49  ?Resp: '16 14 17 17  '$ ?Temp:   97.7 ?F (36.5 ?C) 97.9 ?F (36.6 ?C)  ?TempSrc:   Oral   ?SpO2: 100% 94% 100% 97%  ?Weight:      ?Height:      ? ?88 year old male lying in the bed comfortably without any acute distress ?Lungs clear to auscultation bilaterally ?Cardiovascular S1-S2 normal, no murmurs ?Abdomen soft, benign ?Skin no rash or lesion ?Neuro alert and oriented, nonfocal ? ?Data Reviewed: ? ?CK1 1187, magnesium 1.6 ? ?Family Communication: None ? ?Disposition: ?Status is: Inpatient ?Remains inpatient appropriate because: Management of syncope, hydration due to rhabdomyolysis ? ? Planned Discharge Destination: Home with Home Health ? ? ? DVT prophylaxis- Lovenox subcu ?Time spent: 35 minutes ? ?Author: ?Max Sane, MD ?01/02/2022 9:59 PM ? ?For on call review [email or web address].  ?

## 2022-01-03 ENCOUNTER — Inpatient Hospital Stay (INDEPENDENT_AMBULATORY_CARE_PROVIDER_SITE_OTHER)
Admit: 2022-01-03 | Discharge: 2022-01-03 | Disposition: A | Discharge status: Home / Self Care | Payer: Medicare Other | Attending: Medical | Admitting: Medical

## 2022-01-03 DIAGNOSIS — R001 Bradycardia, unspecified: Secondary | ICD-10-CM

## 2022-01-03 DIAGNOSIS — R112 Nausea with vomiting, unspecified: Secondary | ICD-10-CM

## 2022-01-03 LAB — CBC
HCT: 39.5 % (ref 39.0–52.0)
Hemoglobin: 12.8 g/dL — ABNORMAL LOW (ref 13.0–17.0)
MCH: 28.6 pg (ref 26.0–34.0)
MCHC: 32.4 g/dL (ref 30.0–36.0)
MCV: 88.4 fL (ref 80.0–100.0)
Platelets: 148 10*3/uL — ABNORMAL LOW (ref 150–400)
RBC: 4.47 MIL/uL (ref 4.22–5.81)
RDW: 14.1 % (ref 11.5–15.5)
WBC: 9.1 10*3/uL (ref 4.0–10.5)
nRBC: 0 % (ref 0.0–0.2)

## 2022-01-03 LAB — BASIC METABOLIC PANEL
Anion gap: 9 (ref 5–15)
BUN: 34 mg/dL — ABNORMAL HIGH (ref 8–23)
CO2: 21 mmol/L — ABNORMAL LOW (ref 22–32)
Calcium: 8.2 mg/dL — ABNORMAL LOW (ref 8.9–10.3)
Chloride: 106 mmol/L (ref 98–111)
Creatinine, Ser: 1.41 mg/dL — ABNORMAL HIGH (ref 0.61–1.24)
GFR, Estimated: 48 mL/min — ABNORMAL LOW (ref >60)
Glucose, Bld: 195 mg/dL — ABNORMAL HIGH (ref 70–99)
Potassium: 4.6 mmol/L (ref 3.5–5.1)
Sodium: 136 mmol/L (ref 135–145)

## 2022-01-03 LAB — GLUCOSE, CAPILLARY
Glucose-Capillary: 202 mg/dL — ABNORMAL HIGH (ref 70–99)
Glucose-Capillary: 150 mg/dL — ABNORMAL HIGH (ref 70–99)
Glucose-Capillary: 153 mg/dL — ABNORMAL HIGH (ref 70–99)
Glucose-Capillary: 217 mg/dL — ABNORMAL HIGH (ref 70–99)

## 2022-01-03 LAB — PROCALCITONIN: Procalcitonin: <0.1 ng/mL

## 2022-01-03 LAB — CK: Total CK: 285 U/L (ref 49–397)

## 2022-01-03 MED ORDER — MUPIROCIN 2 % EX OINT: 1.0000 "application " | TOPICAL_OINTMENT | Freq: Two times a day (BID) | CUTANEOUS | 0 refills | Status: DC

## 2022-01-03 MED ORDER — NIRMATRELVIR/RITONAVIR (PAXLOVID) TABLET (RENAL DOSING): 2.0000 | ORAL_TABLET | Freq: Two times a day (BID) | ORAL | 0 refills | Status: AC

## 2022-01-03 NOTE — Progress Notes (Signed)
? ?Progress Note ? ?Patient Name: James Spurr, MD ?Date of Encounter: 01/03/2022 ? ?[facility] Cardiologist: New ? ?Subjective  ? ?Patient see by MD given COVID status. Telemetry shows SB with Heart rates mid 40s-50s.  ? ?Inpatient Medications  ?  ?Scheduled Meds: ? vitamin C  1,000 mg Oral Daily  ? brimonidine  1 drop Both Eyes TID  ? chlorhexidine  15 mL Mouth Rinse BID  ? Chlorhexidine Gluconate Cloth  6 each Topical Daily  ? clopidogrel  75 mg Oral Daily  ? dexamethasone (DECADRON) injection  6 mg Intravenous Q24H  ? DULoxetine  60 mg Oral Daily  ? enoxaparin (LOVENOX) injection  40 mg Subcutaneous Q24H  ? famotidine  20 mg Oral QHS  ? gabapentin  600 mg Oral TID  ? insulin aspart  0-15 Units Subcutaneous Q4H  ? levothyroxine  50 mcg Oral QAC breakfast  ? mouth rinse  15 mL Mouth Rinse q12n4p  ? midodrine  10 mg Oral TID WC  ? mupirocin ointment  1 application. Nasal BID  ? nirmatrelvir/ritonavir EUA (renal dosing)  2 tablet Oral BID  ? nystatin   Topical BID  ? pantoprazole  40 mg Oral Daily  ? sodium chloride flush  3 mL Intravenous Q12H  ? ?Continuous Infusions: ? ?PRN Meds: ?acetaminophen **OR** acetaminophen, dextrose, guaiFENesin-dextromethorphan, ipratropium-albuterol, melatonin, nitroGLYCERIN, ondansetron **OR** ondansetron (ZOFRAN) IV, sodium chloride  ? ?Vital Signs  ?  ?Vitals:  ? 01/03/22 0027 01/03/22 0449 01/03/22 0500 01/03/22 0805  ?BP: (!) 176/90 (!) 162/66  (!) 158/78  ?Pulse: (!) 47 (!) 47  (!) 48  ?Resp: '18 17  20  '$ ?Temp: 98.2 ?F (36.8 ?C) 98 ?F (36.7 ?C)  (!) 97.3 ?F (36.3 ?C)  ?TempSrc:      ?SpO2: 100% 95%  97%  ?Weight:   86.8 kg   ?Height:      ? ? ?Intake/Output Summary (Last 24 hours) at 01/03/2022 1150 ?Last data filed at 01/03/2022 0800 ?Gross per 24 hour  ?Intake 240 ml  ?Output 2700 ml  ?Net -2460 ml  ? ? ?  01/03/2022  ?  5:00 AM 01/02/2022  ?  4:28 AM 01/01/2022  ? 11:09 AM  ?Last 3 Weights  ?Weight (lbs) 191 lb 5.8 oz 190 lb 14.7 oz 194 lb 14.2 oz  ?Weight (kg) 86.8 kg  86.6 kg 88.4 kg  ?   ? ?Telemetry  ?  ?SB, HR 40-50s, PACs - Personally Reviewed ? ?ECG  ?  ?No new - Personally Reviewed ? ?Physical Exam  ? ?Per MD ? ?GEN: No acute distress.   ?Neck: No JVD ?Cardiac: bradycardia, RR, no murmurs, rubs, or gallops.  ?Respiratory: Clear to auscultation bilaterally. ?GI: Soft, nontender, non-distended  ?MS: No edema; No deformity. ?Neuro:  Nonfocal  ?Psych: Normal affect  ? ?Labs  ?  ?High Sensitivity Troponin:   ?Recent Labs  ?Lab 01/01/22 ?1118 01/01/22 ?1318  ?TROPONINIHS 7 9  ?   ?Chemistry ?Recent Labs  ?Lab 01/01/22 ?1318 01/01/22 ?1714 01/02/22 ?0409 01/03/22 ?0532  ?NA 136 134* 136 136  ?K 4.2 3.7 4.6 4.6  ?CL 104 103 104 106  ?CO2 '24 23 24 '$ 21*  ?GLUCOSE 157* 109* 190* 195*  ?BUN '21 23 23 '$ 34*  ?CREATININE QUANTITY NOT SUFFICIENT, UNABLE TO PERFORM TEST 1.39* 1.33* 1.41*  ?CALCIUM 8.3* 7.9* 7.9* 8.2*  ?MG  --  1.6* 2.7*  --   ?PROT 7.2  --  6.3*  --   ?ALBUMIN 3.4*  --  2.9*  --   ?AST 37  --  39  --   ?ALT 17  --  18  --   ?ALKPHOS 44  --  33*  --   ?BILITOT 0.9  --  0.7  --   ?GFRNONAA NOT CALCULATED 49* 51* 48*  ?ANIONGAP '8 8 8 9  '$ ?  ?Lipids No results for input(s): CHOL, TRIG, HDL, LABVLDL, LDLCALC, CHOLHDL in the last 168 hours.  ?Hematology ?Recent Labs  ?Lab 01/01/22 ?1118 01/02/22 ?0409 01/03/22 ?0532  ?WBC 9.5 7.4 9.1  ?RBC 5.12 4.32 4.47  ?HGB 14.7 12.7* 12.8*  ?HCT 47.5 39.6 39.5  ?MCV 92.8 91.7 88.4  ?MCH 28.7 29.4 28.6  ?MCHC 30.9 32.1 32.4  ?RDW 14.0 13.9 14.1  ?PLT 148* 141* 148*  ? ?Thyroid  ?Recent Labs  ?Lab 01/01/22 ?1318  ?TSH 2.546  ?  ?BNP ?Recent Labs  ?Lab 01/01/22 ?1118  ?BNP 180.8*  ?  ?DDimer No results for input(s): DDIMER in the last 168 hours.  ? ?Radiology  ?  ?MR BRAIN WO CONTRAST ? ?Result Date: 01/02/2022 ?CLINICAL DATA:  Initial evaluation for acute neuro deficit, stroke suspected. EXAM: MRI HEAD WITHOUT CONTRAST TECHNIQUE: Multiplanar, multiecho pulse sequences of the brain and surrounding structures were obtained without intravenous  contrast. COMPARISON:  Prior head CT from earlier the same day. FINDINGS: Brain: Generalized age-related cerebral atrophy with mild chronic small vessel ischemic disease. Remote lacunar infarct present at the left basal ganglia, extending into the left cerebral peduncle. Associated mild chronic hemosiderin staining. No evidence for acute or subacute infarct. Gray-white matter differentiation otherwise maintained. No other areas of chronic cortical infarction. No other acute or chronic intracranial blood products. No mass lesion, mass effect or midline shift. No hydrocephalus or extra-axial fluid collection. Pituitary gland suprasellar region normal. Midline structures intact. Vascular: Major intracranial vascular flow voids are maintained. Skull and upper cervical spine: Craniocervical junction within normal limits. Bone marrow signal intensity mildly heterogeneous but overall within normal limits. No scalp soft tissue abnormality. Sinuses/Orbits: Patient status post bilateral ocular lens replacement. Mild scattered mucosal thickening noted throughout the paranasal sinuses. No mastoid effusion. Inner ear structures grossly normal. Other: None. IMPRESSION: 1. No acute intracranial abnormality. 2. Remote lacunar infarct involving the left basal ganglia with underlying mild chronic small vessel ischemic disease. Electronically Signed   By: Jeannine Boga M.D.   On: 01/02/2022 00:38  ? ?ECHOCARDIOGRAM COMPLETE ? ?Result Date: 01/02/2022 ?   ECHOCARDIOGRAM REPORT   Patient Name:   James Bell [facility] Date of Exam: 01/02/2022 Medical Rec #:  [id number]          Height:       68.0 in Accession #:    [id number]         Weight:       190.9 lb Date of Birth:  04/08/1931         BSA:          2.004 m? Patient Age:    88 years           BP:           122/60 mmHg Patient Gender: M                  HR:           43 bpm. Exam Location:  [facility] Procedure: 2D Echo, Cardiac Doppler and Color Doppler Indications:     R55 Syncope   History:  Patient has prior history of Echocardiogram examinations. Risk                  Factors:Hypertension, Diabetes and Dyslipidemia.  Sonographer:     Rosalia Hammers Referring Phys:  [id number] AMY N COX Diagnosing Phys: Kathlyn Sacramento MD  Sonographer Comments: Suboptimal apical window and Technically difficult study due to poor echo windows. Image acquisition challenging due to respiratory motion. IMPRESSIONS  1. Left ventricular ejection fraction, by estimation, is 55 to 60%. The left ventricle has normal function. The left ventricle has no regional wall motion abnormalities. Left ventricular diastolic parameters are indeterminate.  2. Right ventricular systolic function is normal. The right ventricular size is normal.  3. The mitral valve is normal in structure. No evidence of mitral valve regurgitation. No evidence of mitral stenosis.  4. The aortic valve is calcified. Aortic valve regurgitation is not visualized. Aortic valve sclerosis/calcification is present, without any evidence of aortic stenosis.  5. The inferior vena cava is normal in size with <50% respiratory variability, suggesting right atrial pressure of 8 mmHg. FINDINGS  Left Ventricle: Left ventricular ejection fraction, by estimation, is 55 to 60%. The left ventricle has normal function. The left ventricle has no regional wall motion abnormalities. The left ventricular internal cavity size was normal in size. There is  borderline left ventricular hypertrophy. Left ventricular diastolic parameters are indeterminate. Right Ventricle: The right ventricular size is normal. No increase in right ventricular wall thickness. Right ventricular systolic function is normal. Left Atrium: Left atrial size was normal in size. Right Atrium: Right atrial size was normal in size. Pericardium: There is no evidence of pericardial effusion. Mitral Valve: The mitral valve is normal in structure. There is mild calcification of the mitral valve leaflet(s). No  evidence of mitral valve regurgitation. No evidence of mitral valve stenosis. MV peak gradient, 4.0 mmHg. The mean mitral valve gradient  is 1.0 mmHg. Tricuspid Valve: The tricuspid valve is normal in structure. Tric

## 2022-01-03 NOTE — Progress Notes (Signed)
Inpatient Diabetes Program Recommendations ? ?AACE/ADA: New Consensus Statement on Inpatient Glycemic Control  ? ?Target Ranges:  Prepandial:   less than 140 mg/dL ?     Peak postprandial:   less than 180 mg/dL (1-2 hours) ?     Critically ill patients:  140 - 180 mg/dL  ? ? Latest Reference Range & Units 01/03/22 04:46 01/03/22 08:07  ?Glucose-Capillary 70 - 99 mg/dL 202 (H) 150 (H)  ? ? Latest Reference Range & Units 01/02/22 08:17 01/02/22 12:09 01/02/22 16:53 01/02/22 21:12 01/02/22 23:25  ?Glucose-Capillary 70 - 99 mg/dL 175 (H) 263 (H) 234 (H) 233 (H) 284 (H)  ? ?Review of Glycemic Control ? ?Diabetes history: DM2 ?Outpatient Diabetes medications: Lantus 30 units daily, Novolog 0-9 units TID with meals ?Current orders for Inpatient glycemic control: Novolog 0-15 units Q4H; Decadron 6 mg Q24H ? ?Inpatient Diabetes Program Recommendations:   ? ?Insulin: If steroids are continued, please consider ordering Semglee 9 units Q24H and Novolog 3 units TID with meals for meal coverage if patient eats at least 50% of meals. ? ? ?Thanks, ?Barnie Alderman, RN, MSN, CDE ?Diabetes Coordinator ?Inpatient Diabetes Program ?[phone] (Team Pager from 8am to 5pm) ? ? ?

## 2022-01-03 NOTE — Progress Notes (Signed)
Physical Therapy Treatment ?Patient Details ?Name: James Spurr, MD ?MRN: [id number] ?DOB: 1931/06/01 ?Today's Date: 01/03/2022 ? ? ?History of Present Illness Dr. Chrisotpher Rivero is a 88 year old male with history of insulin-dependent diabetes mellitus, hypothyroid, OSA on CPAP, history of CVA, CKD stage IIIa, GERD, BPH, history of right-sided hemiparesis presumed secondary to late progression from CVA, with dominant side mastic hemiparesis, generalized weakness, hypomagnesemia, who presents emergency department for chief concerns of being found down/syncope. ? ?  ?PT Comments  ? ? Pt seen briefly to assist with standing balance and transfers while getting ready for EMS to arrive for d/c. Pt required MinA to stand from raised bed, Mod/MinA to maintain static standing while pt fastened pants. Discussed d/c plans, pt appears very aware of his limitations and has scheduled assistance and therapy services to progress his functional independence once home. Continue PT if d/c is delayed next available date/time. ?  ?Recommendations for follow up therapy are one component of a multi-disciplinary discharge planning process, led by the attending physician.  Recommendations may be updated based on patient status, additional functional criteria and insurance authorization. ? ?Follow Up Recommendations ? Home health PT ?  ?  ?Assistance Recommended at Discharge Intermittent Supervision/Assistance  ?Patient can return home with the following A little help with walking and/or transfers;A little help with bathing/dressing/bathroom ?  ?Equipment Recommendations ? None recommended by PT  ?  ?Recommendations for Other Services   ? ? ?  ?Precautions / Restrictions Precautions ?Precautions: Fall ?Restrictions ?Weight Bearing Restrictions: No  ?  ? ?Mobility ? Bed Mobility ?Overal bed mobility: Modified Independent ?  ?  ?  ?  ?  ?  ?General bed mobility comments: was able to sit up at edge of bed with mod independence. Use of  rails ?  ? ?Transfers ?Overall transfer level: Needs assistance ?Equipment used: Rolling walker (2 wheels) ?Transfers: Sit to/from Stand ?Sit to Stand: Min guard ?  ?  ?  ?  ?  ?General transfer comment:  (static standing without AD while donning pants buckle with Min/ModA for balance.) ?  ? ?Ambulation/Gait ?  ?  ?  ?  ?  ?  ?  ?  ? ? ?Stairs ?  ?  ?  ?  ?  ? ? ?Wheelchair Mobility ?  ? ?Modified Rankin (Stroke Patients Only) ?  ? ? ?  ?Balance   ?  ?  ?  ?  ?  ?  ?  ?  ?  ?  ?  ?  ?  ?  ?  ?  ?  ?  ?  ? ?  ?Cognition Arousal/Alertness: Awake/alert ?Behavior During Therapy: [facility] for tasks assessed/performed ?Overall Cognitive Status: Within Functional Limits for tasks assessed ?  ?  ?  ?  ?  ?  ?  ?  ?  ?  ?  ?  ?  ?  ?  ?  ?  ?  ?  ? ?  ?Exercises   ? ?  ?General Comments   ?  ?  ? ?Pertinent Vitals/Pain Pain Assessment ?Pain Assessment: No/denies pain  ? ? ?Home Living   ?  ?  ?  ?  ?  ?  ?  ?  ?  ?   ?  ?Prior Function    ?  ?  ?   ? ?PT Goals (current goals can now be found in the care plan section) Acute Rehab PT Goals ?Patient Stated Goal: to return to  [facility] ? ?  ?Frequency ? ? ? Min 2X/week ? ? ? ?  ?PT Plan Current plan remains appropriate  ? ? ?Co-evaluation   ?  ?  ?  ?  ? ?  ?AM-PAC PT "6 Clicks" Mobility   ?Outcome Measure ? Help needed turning from your back to your side while in a flat bed without using bedrails?: A Little ?Help needed moving from lying on your back to sitting on the side of a flat bed without using bedrails?: A Little ?Help needed moving to and from a bed to a chair (including a wheelchair)?: A Little ?Help needed standing up from a chair using your arms (e.g., wheelchair or bedside chair)?: A Little ?Help needed to walk in hospital room?: A Lot ?Help needed climbing 3-5 steps with a railing? : Total ?6 Click Score: 15 ? ?  ?End of Session Equipment Utilized During Treatment: Gait belt ?Activity Tolerance:  (Session limited due to awaiting arrival of EMS for d/c) ?Patient left:  in chair;with call bell/phone within reach;with family/visitor present ?Nurse Communication: Mobility status ?PT Visit Diagnosis: Other abnormalities of gait and mobility (R26.89);Muscle weakness (generalized) (M62.81);Difficulty in walking, not elsewhere classified (R26.2) ?  ? ? ?Time: 6010-9323 ?PT Time Calculation (min) (ACUTE ONLY): 9 min ? ?Charges:  $Therapeutic Activity: 8-22 mins          ?          ?Mikel Cella, PTA ? ? ? ?[name] ?01/03/2022, 3:26 PM ? ?

## 2022-01-03 NOTE — Plan of Care (Signed)

## 2022-01-03 NOTE — Progress Notes (Signed)
[facility]) Hospital Liaison note: ? ?This patient is currently enrolled in [facility] outpatient-based Palliative Care. Will continue to follow for disposition. ? ?Please call with any outpatient palliative questions or concerns. ? ?Thank you, ?Lorelee Market, LPN ?[facility] Hospital Liaison ?[phone] ?

## 2022-01-03 NOTE — TOC Transition Note (Signed)
Transition of Care (TOC) - CM/SW Discharge Note ? ? ?Patient Details  ?Name: James Spurr, MD ?MRN: [id number] ?Date of Birth: 1930/10/24 ? ?Transition of Care (TOC) CM/SW Contact:  ?Candie Chroman, LCSW ?Phone Number: ?01/03/2022, 12:58 PM ? ? ?Clinical Narrative:   Patient has orders to discharge home today. [name] is aware. Daughter unable to get him in her car so set up EMS transport for 2:30. Heart monitor has been delivered per RN. Patient lives in a third floor apartment so if EMS unable to get up to the third floor [facility] representative, [name], will ask staff to have his electric wheelchair brought down. Daughter asked about additional home services. She will contact [facility] representative if PCS needed. No further concerns. CSW signing off. ? ?Final next level of care: [place] ?Barriers to Discharge: Barriers Resolved ? ? ?Patient Goals and CMS Choice ?  ?  ?Choice offered to / list presented to : NA ? ?Discharge Placement ?  ?           ?  ?Patient to be transferred to facility by: EMS ?Name of family member notified: [name] ?Patient and family notified of of transfer: 01/03/22 ? ?Discharge Plan and Services ?  ?  ?Post Acute Care Choice: Resumption of Svcs/PTA Provider          ?  ?  ?  ?  ?  ?HH Arranged: RN, PT, OT ?[place] Agency: [facility] ?Date HH Agency Contacted: 01/03/22 ?  ?Representative spoke with at [place]: [name] ? ?Social Determinants of Health (SDOH) Interventions ?  ? ? ?Readmission Risk Interventions ?   ? View : No data to display.  ?  ?  ?  ? ? ? ? ? ?

## 2022-01-04 ENCOUNTER — Telehealth: Payer: Self-pay

## 2022-01-04 DIAGNOSIS — R001 Bradycardia, unspecified: Secondary | ICD-10-CM | POA: Diagnosis not present

## 2022-01-04 NOTE — Telephone Encounter (Signed)
1 pm.  Phone call made to patient to schedule a follow up visit with NP post hospitalization.  No answer.  Message left requesting a call back.  ?

## 2022-01-05 NOTE — Discharge Summary (Signed)
?Physician Discharge Summary ?  ?Patient: James RETZ, MD MRN: [id number] DOB: 1931/04/22  ?Admit date:     01/01/2022  ?Discharge date: 01/03/2022  ?Discharge Physician: Max Sane  ? ?PCP: [name], MD  ? ?Recommendations at discharge:  ? ? F/up with outpt providers as requested ? ?Discharge Diagnoses: ?Principal Problem: ?  Syncope and collapse ?Active Problems: ?  History of CVA (cerebrovascular accident) ?  Hemiparesis affecting right side as late effect of cerebrovascular accident (CVA) ([place]) ?  OSA on CPAP ?  Chronic kidney disease, stage 3a ([place]) ?  Hyperlipidemia associated with type 2 diabetes mellitus ([place]) ?  Restless leg syndrome, severe ?  GERD (gastroesophageal reflux disease) ?  BPH with obstruction/lower urinary tract symptoms ?  MDD (major depressive disorder), recurrent episode, moderate ([place]) ?  HTN (hypertension) ?  Gait disturbance, post-stroke ?  Thrombocytopenia ([place]) ?  Frequent UTI ?  General weakness ?  Insulin dependent type 2 diabetes mellitus ([place]) ?  Insomnia ?  Hypoglycemia ?  Altered mental status ?  COVID-19 ?  Non-traumatic rhabdomyolysis ?  Bradycardia ? ?Hospital Course: ?Dr. Nikolis Berent is a 88 year old male with history of insulin-dependent diabetes mellitus, hypothyroid, OSA on CPAP, history of CVA, CKD stage IIIa, GERD, BPH, history of right-sided hemiparesis presumed secondary to late progression from CVA, with dominant side mastic hemiparesis, generalized weakness, hypomagnesemia, who presents emergency department for chief concerns of being found down/syncope. ? ?Initial vitals in the emergency department showed temperature of 98.9, and increased to 100, respiration rate of 21, heart rate of 79, blood pressure 125/64, SPO2 98% on room air. ? ?Serum sodium is 136, potassium 4.2, chloride 104, bicarb 25, BUN of 21, serum creatinine and GFR quantity not sufficient to determine.  Nonfasting blood glucose 157.  Anion gap was 8.  High sensitive troponin was  initially 7 and increased to 9. ? ?TSH was 2.546.  Serum calcium is 8.3. ? ?WBC is 9.5, hemoglobin 14.7, platelets of 148. ? ?UA was negative for leukocytes and nitrates.  UA was positive for greater than 500 of glucose, and hemoglobin showed small. ? ?COVID/influenza A/influenza B PCR resulted and COVID was positive. ? ?ED imaging: ? ?Portable chest x-ray was read as no acute cardiopulmonary abnormality or acute traumatic injury identified. ? ?CT head without contrast was read as no acute traumatic injury identified.  Stable noncontrast CT appearance of the brain with chronic infarct of the left corona radiata and lentiform.  New mild paranasal sinus inflammation, small volume retained secretions in the nasopharynx. ? ?CT chest abdomen and pelvis without contrast was read as no acute traumatic injury identified in the noncontrast chest, abdomen, pelvis.  Improved atelectasis from last month with new bandlike also mildly spiculated opacity in the right middle lobe (12 mm).  Distended urinary bladder (415 mL).  Chronic nephrolithiasis.  Aortic atherosclerosis. ? ?ED treatment: DuoNebs x1, dextrose 10% infusion, sodium chloride 500 mL bolus, ondansetron 4 mg IV. ? ?4/17: Cardiology recommends conservative management ? ?Assessment and Plan: ?* Syncope and collapse ?- Likely due to hypoglycemia in the setting of COVID-19 infection.  Less likely from sinus bradycardia or brief episode of Mobitz type II second degree AV block.  TSH within normal limit ?- Complete echo shows normal LV systolic function. MRI of the brain without contrast shows no acute stroke ?- Blood cultures x2 negative thus far ?-Cardiology placed 2-week ZIO monitor at discharge. ? ?Non-traumatic rhabdomyolysis ?Improved with hydration ? ?COVID-19 Infection ?- Continue Paxlovid at D/C to  finish the course ?Considering procalcitonin is within normal limit - antibiotics stopped ? ?Altered mental status ?Transient and now back to normal baseline mental  status ? ?Hypoglycemia ?- likely due to poor PO intake in the setting of COVID-19 infection. ?- now resolved. ? ?Insomnia ?Insulin dependent type 2 diabetes mellitus ([place]) ?- resume home regimen ? ?HTN (hypertension) ?- continue home regimen ? ?MDD (major depressive disorder), recurrent episode, moderate ([place]) ?Continue duloxetine 60 mg daily ? ?BPH with obstruction/lower urinary tract symptoms ?GERD (gastroesophageal reflux disease) ?Restless leg syndrome, severe ?Continue home gabapentin 600 mg p.o. 3 times daily ? ? ? ? ?  ? ? ?Consultants: Cardio ?Disposition: Home health ?Diet recommendation:  ?Discharge Diet Orders (From admission, onward)  ? ?  Start     Ordered  ? 01/03/22 0000  Diet - low sodium heart healthy       ? 01/03/22 1212  ? ?  ?  ? ?  ? ?Carb modified diet ?DISCHARGE MEDICATION: ?Allergies as of 01/03/2022   ? ?   Reactions  ? Dulaglutide   ? Other reaction(s): upset stomach ?Other reaction(s): Abdominal Pain, upset stomach ?Other reaction(s): upset stomach  ? Empagliflozin Other (See Comments)  ? Other reaction(s): yeast infection ?Other reaction(s): Other (See Comments) ?Other reaction(s): yeast infection  ? Lisinopril Cough  ? tachycardia  ? Nexium [esomeprazole] Other (See Comments)  ? Headache  ? Oxycodone Other (See Comments)  ? Even 1/2 tablet of '5mg'$  caused increased confusion - contributory to hospitalization 10/2020 with AMS  ? Pregabalin Other (See Comments), Nausea Only  ? Other reaction(s): MALAISE, SLIGHT NAUSEA, SLIGHT HEADACHE ?Other reaction(s): MALAISE, SLIGHT NAUSEA, SLIGHT HEADACHE  ? Rosuvastatin Other (See Comments)  ? Other reaction(s): muscle pain ?2.16.2022 Pt is current on this medication without any issues. ?Other reaction(s): muscle pain ?2.16.2022 Pt is current on this medication without any issues. ?Other reaction(s): muscle pain  ? ?  ? ?  ?Medication List  ?  ? ?TAKE these medications   ? ?acetaminophen 325 MG tablet ?Commonly known as: TYLENOL ?Take 2 tablets (650 mg  total) by mouth every 6 (six) hours as needed for mild pain (or Fever >/= 101). ?  ?Alpha-Lipoic Acid 600 MG Caps ?Take 1 capsule (600 mg total) by mouth daily. ?  ?clopidogrel 75 MG tablet ?Commonly known as: PLAVIX ?TAKE 1 TABLET DAILY ?  ?Combigan 0.2-0.5 % ophthalmic solution ?Generic drug: brimonidine-timolol ?Place 1 drop into both eyes every 12 (twelve) hours. ?  ?COQ10 150 MG Caps ?Take 1 capsule by mouth daily. ?  ?DULoxetine 60 MG capsule ?Commonly known as: CYMBALTA ?Take 1 capsule (60 mg total) by mouth daily. ?  ?famotidine 20 MG tablet ?Commonly known as: Pepcid ?Take 1 tablet (20 mg total) by mouth at bedtime. ?  ?gabapentin 300 MG capsule ?Commonly known as: NEURONTIN ?Take 2 capsules (600 mg total) by mouth 3 (three) times daily. 600 mg BID is his minimum dose without having breakthrough symptoms. He is aware 900 mg/day is recommended per kidney function. ?  ?insulin aspart 100 UNIT/ML injection ?Commonly known as: novoLOG ?CBG 70 - 120: 0 units  ?CBG 121 - 150: 1 unit  ?CBG 151 - 200: 2 units  ?CBG 201 - 250: 3 units ? CBG 251 - 300: 5 units  ?CBG 301 - 350: 7 units  ?CBG 351 - 400: 9 units ?  ?insulin glargine 100 UNIT/ML injection ?Commonly known as: LANTUS ?Inject 0.2 mLs (20 Units total) into the skin at bedtime. ?What  changed: additional instructions ?  ?levothyroxine 50 MCG tablet ?Commonly known as: SYNTHROID ?Take 1 tablet (50 mcg total) by mouth daily before breakfast. Per endo [name] ?  ?melatonin 5 MG Tabs ?Take 5 mg by mouth at bedtime. ?  ?mupirocin ointment 2 % ?Commonly known as: BACTROBAN ?Place 1 application. into the nose 2 (two) times daily. ?  ?nirmatrelvir/ritonavir EUA (renal dosing) 10 x 150 MG & 10 x '100MG'$  Tabs ?Commonly known as: PAXLOVID ?Take 2 tablets by mouth 2 (two) times daily for 4 days. Patient GFR is 48. Take nirmatrelvir (150 mg) one tablet twice daily for 5 days and ritonavir (100 mg) one tablet twice daily for 5 days. ?  ?nitroGLYCERIN 0.4 MG SL  tablet ?Commonly known as: Nitrostat ?DISSOLVE 1 TABLET UNDER THE TONGUE EVERY 5 MINUTES AS NEEDED FOR CHEST PAIN ?  ?ondansetron 4 MG tablet ?Commonly known as: Zofran ?Take 1 tablet (4 mg total) by mouth every 8 (eight)

## 2022-01-06 ENCOUNTER — Encounter: Payer: Self-pay | Admitting: Family Medicine

## 2022-01-06 ENCOUNTER — Telehealth (INDEPENDENT_AMBULATORY_CARE_PROVIDER_SITE_OTHER): Payer: Medicare Other | Admitting: Family Medicine

## 2022-01-06 ENCOUNTER — Telehealth: Payer: Self-pay

## 2022-01-06 VITALS — BP 138/85 | HR 81 | Temp 96.0°F | Ht 68.0 in

## 2022-01-06 DIAGNOSIS — Z794 Long term (current) use of insulin: Secondary | ICD-10-CM | POA: Diagnosis not present

## 2022-01-06 DIAGNOSIS — G479 Sleep disorder, unspecified: Secondary | ICD-10-CM | POA: Diagnosis not present

## 2022-01-06 DIAGNOSIS — Z87440 Personal history of urinary (tract) infections: Secondary | ICD-10-CM | POA: Diagnosis not present

## 2022-01-06 DIAGNOSIS — E162 Hypoglycemia, unspecified: Secondary | ICD-10-CM | POA: Diagnosis not present

## 2022-01-06 DIAGNOSIS — I69351 Hemiplegia and hemiparesis following cerebral infarction affecting right dominant side: Secondary | ICD-10-CM

## 2022-01-06 DIAGNOSIS — G8929 Other chronic pain: Secondary | ICD-10-CM | POA: Diagnosis not present

## 2022-01-06 DIAGNOSIS — R339 Retention of urine, unspecified: Secondary | ICD-10-CM | POA: Diagnosis not present

## 2022-01-06 DIAGNOSIS — M6282 Rhabdomyolysis: Secondary | ICD-10-CM | POA: Diagnosis not present

## 2022-01-06 DIAGNOSIS — Z7985 Long-term (current) use of injectable non-insulin antidiabetic drugs: Secondary | ICD-10-CM | POA: Diagnosis not present

## 2022-01-06 DIAGNOSIS — G2581 Restless legs syndrome: Secondary | ICD-10-CM | POA: Diagnosis not present

## 2022-01-06 DIAGNOSIS — E1165 Type 2 diabetes mellitus with hyperglycemia: Secondary | ICD-10-CM | POA: Diagnosis not present

## 2022-01-06 DIAGNOSIS — E1122 Type 2 diabetes mellitus with diabetic chronic kidney disease: Secondary | ICD-10-CM | POA: Diagnosis not present

## 2022-01-06 DIAGNOSIS — N1831 Chronic kidney disease, stage 3a: Secondary | ICD-10-CM | POA: Diagnosis not present

## 2022-01-06 DIAGNOSIS — E785 Hyperlipidemia, unspecified: Secondary | ICD-10-CM | POA: Diagnosis not present

## 2022-01-06 DIAGNOSIS — K5900 Constipation, unspecified: Secondary | ICD-10-CM | POA: Diagnosis not present

## 2022-01-06 DIAGNOSIS — K219 Gastro-esophageal reflux disease without esophagitis: Secondary | ICD-10-CM | POA: Diagnosis not present

## 2022-01-06 DIAGNOSIS — Z7902 Long term (current) use of antithrombotics/antiplatelets: Secondary | ICD-10-CM | POA: Diagnosis not present

## 2022-01-06 DIAGNOSIS — I129 Hypertensive chronic kidney disease with stage 1 through stage 4 chronic kidney disease, or unspecified chronic kidney disease: Secondary | ICD-10-CM | POA: Diagnosis not present

## 2022-01-06 DIAGNOSIS — E1140 Type 2 diabetes mellitus with diabetic neuropathy, unspecified: Secondary | ICD-10-CM | POA: Diagnosis not present

## 2022-01-06 DIAGNOSIS — R001 Bradycardia, unspecified: Secondary | ICD-10-CM | POA: Diagnosis not present

## 2022-01-06 DIAGNOSIS — E039 Hypothyroidism, unspecified: Secondary | ICD-10-CM | POA: Diagnosis not present

## 2022-01-06 DIAGNOSIS — U071 COVID-19: Secondary | ICD-10-CM | POA: Diagnosis not present

## 2022-01-06 LAB — CULTURE, BLOOD (ROUTINE X 2)
Culture: NO GROWTH
Culture: NO GROWTH
Special Requests: ADEQUATE
Special Requests: ADEQUATE

## 2022-01-06 MED ORDER — FLUTICASONE PROPIONATE HFA 110 MCG/ACT IN AERO: 1.0000 | INHALATION_SPRAY | Freq: Two times a day (BID) | RESPIRATORY_TRACT | 0 refills | Status: DC

## 2022-01-06 NOTE — Telephone Encounter (Signed)
I was able to reach pt and get him set up for visit.  Pt is now connected and ready.  ?

## 2022-01-06 NOTE — Progress Notes (Signed)
? ? Patient ID: James Spurr, MD, male    DOB: April 09, 1931, 88 y.o.   MRN: [id number] ? ?Virtual visit completed through MyChart, a video enabled telemedicine application. Due to national recommendations of social distancing due to COVID-19, a virtual visit is felt to be most appropriate for this patient at this time. Reviewed limitations, risks, security and privacy concerns of performing a virtual visit and the availability of in person appointments. I also reviewed that there may be a patient responsible charge related to this service. The patient agreed to proceed.  ? ?Patient location: home ?Provider location: Financial controller at [facility], office ?Persons participating in this virtual visit: patient, provider  ? ?If any vitals were documented, they were collected by patient at home unless specified below.   ? ?BP 138/85   Pulse 81   Temp (!) 96 ?F (35.6 ?C)   Ht '5\' 8"'$  (1.727 m)   SpO2 96%   BMI 29.10 kg/m?   ? ?CC: hospital f/u visit  ?Subjective:  ? 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 ? ?Recent hospitalization for syncope, found down at home.  He did test positive for COVID.  In the ER he was treated with DuoNebs, IV fluids and Zofran.  Syncope was attributed to hypoglycemia in setting of COVID infection vs possible arrhythmia. He did have a echocardiogram showing normal left ventricular systolic function and a MRI of the brain without an acute stroke. Blood cultures returned negative x2, final growth.  He did have rhabdomyolysis which did improve prior with hydration to discharge.  He was treated with Paxlovid renally dosed #10.  ?Hospital records reviewed. Med rec performed.  ? ?First time COVID infection.  ?COVID symptoms included fatigue, cough, congestion. No fever or chills.  ?First day of symptoms was Friday 11/29/2021.  ? ?Home health not set  up.  ?Other follow up appointments scheduled: cardiology 01/17/2022.  ?He currently has Zio patch in place.  ?Currently resides at [facility].  ? ?DM - continues ozempic '1mg'$  weekly along with novolog SSI (18-24u BID with meals). This is followed by endo Dr James Bell. Next appt scheduled sometime in May.  ?______________________________________________________________________ ?Hospital admission: 01/01/2022 ?Hospital discharge: 01/03/2022 ?TCM f/u phone call: not performed  ? ?Recommendations at discharge:  ?  ? F/up with outpt providers as requested ?  ?Discharge Diagnoses: ?Principal Problem: ?  Syncope and collapse ?Active Problems: ?  History of CVA (cerebrovascular accident) ?  Hemiparesis affecting right side as late effect of cerebrovascular accident (CVA) ([place]) ?  OSA on CPAP ?  Chronic kidney disease, stage 3a ([place]) ?  Hyperlipidemia associated with type 2 diabetes mellitus ([place]) ?  Restless leg syndrome, severe ?  GERD (gastroesophageal reflux disease) ?  BPH with obstruction/lower urinary tract symptoms ?  MDD (major depressive disorder), recurrent episode, moderate ([place]) ?  HTN (hypertension) ?  Gait disturbance, post-stroke ?  Thrombocytopenia ([place]) ?  Frequent UTI ?  General weakness ?  Insulin dependent type 2 diabetes mellitus ([place]) ?  Insomnia ?  Hypoglycemia ?  Altered mental status ?  COVID-19 ?  Non-traumatic rhabdomyolysis ?  Bradycardia ?   ? ?Relevant past medical, surgical, family and social history reviewed and updated as indicated. Interim medical history since our last visit reviewed. ?Allergies and medications reviewed and updated. ?Outpatient Medications Prior to Visit  ?Medication Sig Dispense Refill  ? acetaminophen (TYLENOL)  325 MG tablet Take 2 tablets (650 mg total) by mouth every 6 (six) hours as needed for mild pain (or Fever >/= 101).    ? Alpha-Lipoic Acid 600 MG CAPS Take 1 capsule (600 mg total) by mouth daily. 30 capsule 11  ? brimonidine-timolol (COMBIGAN) 0.2-0.5 %  ophthalmic solution Place 1 drop into both eyes every 12 (twelve) hours.    ? clopidogrel (PLAVIX) 75 MG tablet TAKE 1 TABLET DAILY 90 tablet 3  ? Coenzyme Q10 (COQ10) 150 MG CAPS Take 1 capsule by mouth daily.    ? DULoxetine (CYMBALTA) 60 MG capsule Take 1 capsule (60 mg total) by mouth daily. 90 capsule 3  ? famotidine (PEPCID) 20 MG tablet Take 1 tablet (20 mg total) by mouth at bedtime.    ? gabapentin (NEURONTIN) 300 MG capsule Take 2 capsules (600 mg total) by mouth 3 (three) times daily. 600 mg BID is his minimum dose without having breakthrough symptoms. He is aware 900 mg/day is recommended per kidney function.    ? insulin aspart (NOVOLOG) 100 UNIT/ML injection CBG 70 - 120: 0 units  ?CBG 121 - 150: 1 unit  ?CBG 151 - 200: 2 units  ?CBG 201 - 250: 3 units ? CBG 251 - 300: 5 units  ?CBG 301 - 350: 7 units  ?CBG 351 - 400: 9 units 10 mL 11  ? Insulin Pen Needle (ULTICARE MICRO PEN NEEDLES) 31G X 8 MM MISC Use to inject medication daily 100 each 3  ? levothyroxine (SYNTHROID) 50 MCG tablet Take 1 tablet (50 mcg total) by mouth daily before breakfast. Per endo Dr James Bell    ? melatonin 5 MG TABS Take 5 mg by mouth at bedtime.    ? mupirocin ointment (BACTROBAN) 2 % Place 1 application. into the nose 2 (two) times daily. 22 g 0  ? nirmatrelvir/ritonavir EUA, renal dosing, (PAXLOVID) 10 x 150 MG & 10 x '100MG'$  TABS Take 2 tablets by mouth 2 (two) times daily for 4 days. Patient GFR is 48. Take nirmatrelvir (150 mg) one tablet twice daily for 5 days and ritonavir (100 mg) one tablet twice daily for 5 days. 16 tablet 0  ? nitroGLYCERIN (NITROSTAT) 0.4 MG SL tablet DISSOLVE 1 TABLET UNDER THE TONGUE EVERY 5 MINUTES AS NEEDED FOR CHEST PAIN 25 tablet 1  ? ondansetron (ZOFRAN) 4 MG tablet Take 1 tablet (4 mg total) by mouth every 8 (eight) hours as needed for nausea or vomiting. 30 tablet 0  ? polyethylene glycol (MIRALAX / GLYCOLAX) 17 g packet Take 17 g by mouth daily as needed. 14 each 0  ? pramipexole (MIRAPEX) 1 MG  tablet Take 1 tablet (1 mg total) by mouth daily. At 4pm 90 tablet 3  ? RABEprazole (ACIPHEX) 20 MG tablet Take 1 tablet (20 mg total) by mouth daily. 90 tablet 3  ? Semaglutide, 1 MG/DOSE, (OZEMPIC, 1 MG/DOSE,) 4 MG/3ML SOPN Inject 1 mg into the skin once a week.    ? sodium chloride (MURO 128) 5 % ophthalmic solution 1 drop as needed for eye irritation.    ? tamsulosin (FLOMAX) 0.4 MG CAPS capsule Take 1 capsule (0.4 mg total) by mouth daily after breakfast. 90 capsule 3  ? vitamin C (ASCORBIC ACID) 500 MG tablet Take 1,000 mg by mouth daily as needed.    ? insulin glargine (LANTUS) 100 UNIT/ML injection Inject 0.2 mLs (20 Units total) into the skin at bedtime. (Patient taking differently: Inject 20 Units into the skin at bedtime.  Injects 30 units daily at bedtime) 10 mL 11  ? ?No facility-administered medications prior to visit.  ?  ? ?Per HPI unless specifically indicated in ROS section below ?Review of Systems ?Objective:  ?BP 138/85   Pulse 81   Temp (!) 96 ?F (35.6 ?C)   Ht '5\' 8"'$  (1.727 m)   SpO2 96%   BMI 29.10 kg/m?   ?Wt Readings from Last 3 Encounters:  ?01/03/22 191 lb 5.8 oz (86.8 kg)  ?12/13/21 188 lb 6 oz (85.4 kg)  ?11/29/21 186 lb (84.4 kg)  ?  ?  ? ?Physical exam: ?Gen: alert, NAD, not ill appearing ?Pulm: speaks in complete sentences without increased work of breathing ?Psych: normal mood, normal thought content  ? ?   ?Lab Results  ?Component Value Date  ? CREATININE 1.41 (H) 01/03/2022  ? BUN 34 (H) 01/03/2022  ? NA 136 01/03/2022  ? K 4.6 01/03/2022  ? CL 106 01/03/2022  ? CO2 21 (L) 01/03/2022  ?  ?Lab Results  ?Component Value Date  ? WBC 9.1 01/03/2022  ? HGB 12.8 (L) 01/03/2022  ? HCT 39.5 01/03/2022  ? MCV 88.4 01/03/2022  ? PLT 148 (L) 01/03/2022  ?  ?Lab Results  ?Component Value Date  ? CKTOTAL 285 01/03/2022  ? TROPONINI <0.30 03/11/2012  ? ?Lab Results  ?Component Value Date  ? HGBA1C 7.2 (H) 10/25/2021  ?  ?Assessment & Plan:  ? ?Problem List Items Addressed This Visit   ? ?  Hemiparesis affecting right side as late effect of cerebrovascular accident (CVA) ([place])  ? Uncontrolled type 2 diabetes mellitus with hyperglycemia, with long-term current use of insulin ([place])  ?  This is followed by endo D

## 2022-01-06 NOTE — Telephone Encounter (Signed)
Pt has MyChart visit scheduled today at 12:00 w/ Dr. Darnell Level.  ? ?Attempted several times to contact pt.  Lvm asking him to call back.  Need to speak with pt to get him set up for video visit. ? ?Also, called pt's daughter, James Bell (on dpr), asking if she had another phn # for pt. States the cell # is his only phn.  She suggested calling [facility] where pt resides to see if someone can go tell him to call Dr. Synthia Innocent office. ? ?I spoke with Jinny Blossom at [facility] explaining we are trying to reach pt but we keep getting his vm. Says she will try to get someone to try and have him call our office.  ?

## 2022-01-07 NOTE — Assessment & Plan Note (Signed)
This is followed by endo [name]. ?Pt states latest regimen change was stopping lantus and starting ozempic - he is currently on '1mg'$  weekly dose and tolerating well, he continues novolog per [facility]SI ?

## 2022-01-07 NOTE — Assessment & Plan Note (Signed)
HR normal on home vital check today. He is currently wearing Zio patch.  ?He does have cardiology f/u scheduled.  ?

## 2022-01-07 NOTE — Assessment & Plan Note (Addendum)
Anticipate contributed to syncope resulting in recent hospitalization.  ?He did complete paxlovid course and seems to have tolerated well. I don't see if meds were altered while on paxlovid due to drug interactions - but he has completed regimen and seems to be recovering well.  ?Discussed isolation/mask wearing guidelines.  ?

## 2022-01-07 NOTE — Assessment & Plan Note (Addendum)
In setting of poor PO intake due to COVID.  ?This is now resolved.  ?

## 2022-01-07 NOTE — Assessment & Plan Note (Signed)
This is resolved

## 2022-01-10 ENCOUNTER — Telehealth: Payer: Self-pay

## 2022-01-10 DIAGNOSIS — G479 Sleep disorder, unspecified: Secondary | ICD-10-CM | POA: Diagnosis not present

## 2022-01-10 DIAGNOSIS — Z794 Long term (current) use of insulin: Secondary | ICD-10-CM | POA: Diagnosis not present

## 2022-01-10 DIAGNOSIS — E039 Hypothyroidism, unspecified: Secondary | ICD-10-CM | POA: Diagnosis not present

## 2022-01-10 DIAGNOSIS — G8929 Other chronic pain: Secondary | ICD-10-CM | POA: Diagnosis not present

## 2022-01-10 DIAGNOSIS — I69351 Hemiplegia and hemiparesis following cerebral infarction affecting right dominant side: Secondary | ICD-10-CM | POA: Diagnosis not present

## 2022-01-10 DIAGNOSIS — G2581 Restless legs syndrome: Secondary | ICD-10-CM | POA: Diagnosis not present

## 2022-01-10 DIAGNOSIS — K219 Gastro-esophageal reflux disease without esophagitis: Secondary | ICD-10-CM | POA: Diagnosis not present

## 2022-01-10 DIAGNOSIS — K5900 Constipation, unspecified: Secondary | ICD-10-CM | POA: Diagnosis not present

## 2022-01-10 DIAGNOSIS — E1140 Type 2 diabetes mellitus with diabetic neuropathy, unspecified: Secondary | ICD-10-CM | POA: Diagnosis not present

## 2022-01-10 DIAGNOSIS — Z7985 Long-term (current) use of injectable non-insulin antidiabetic drugs: Secondary | ICD-10-CM | POA: Diagnosis not present

## 2022-01-10 DIAGNOSIS — E1122 Type 2 diabetes mellitus with diabetic chronic kidney disease: Secondary | ICD-10-CM | POA: Diagnosis not present

## 2022-01-10 DIAGNOSIS — I129 Hypertensive chronic kidney disease with stage 1 through stage 4 chronic kidney disease, or unspecified chronic kidney disease: Secondary | ICD-10-CM | POA: Diagnosis not present

## 2022-01-10 DIAGNOSIS — Z87440 Personal history of urinary (tract) infections: Secondary | ICD-10-CM | POA: Diagnosis not present

## 2022-01-10 DIAGNOSIS — Z7902 Long term (current) use of antithrombotics/antiplatelets: Secondary | ICD-10-CM | POA: Diagnosis not present

## 2022-01-10 DIAGNOSIS — R339 Retention of urine, unspecified: Secondary | ICD-10-CM | POA: Diagnosis not present

## 2022-01-10 DIAGNOSIS — N1831 Chronic kidney disease, stage 3a: Secondary | ICD-10-CM | POA: Diagnosis not present

## 2022-01-10 DIAGNOSIS — E785 Hyperlipidemia, unspecified: Secondary | ICD-10-CM | POA: Diagnosis not present

## 2022-01-11 DIAGNOSIS — E039 Hypothyroidism, unspecified: Secondary | ICD-10-CM | POA: Diagnosis not present

## 2022-01-11 DIAGNOSIS — K5900 Constipation, unspecified: Secondary | ICD-10-CM | POA: Diagnosis not present

## 2022-01-11 DIAGNOSIS — G479 Sleep disorder, unspecified: Secondary | ICD-10-CM | POA: Diagnosis not present

## 2022-01-11 DIAGNOSIS — Z794 Long term (current) use of insulin: Secondary | ICD-10-CM | POA: Diagnosis not present

## 2022-01-11 DIAGNOSIS — Z7902 Long term (current) use of antithrombotics/antiplatelets: Secondary | ICD-10-CM | POA: Diagnosis not present

## 2022-01-11 DIAGNOSIS — Z87440 Personal history of urinary (tract) infections: Secondary | ICD-10-CM | POA: Diagnosis not present

## 2022-01-11 DIAGNOSIS — N1831 Chronic kidney disease, stage 3a: Secondary | ICD-10-CM | POA: Diagnosis not present

## 2022-01-11 DIAGNOSIS — G8929 Other chronic pain: Secondary | ICD-10-CM | POA: Diagnosis not present

## 2022-01-11 DIAGNOSIS — E785 Hyperlipidemia, unspecified: Secondary | ICD-10-CM | POA: Diagnosis not present

## 2022-01-11 DIAGNOSIS — I129 Hypertensive chronic kidney disease with stage 1 through stage 4 chronic kidney disease, or unspecified chronic kidney disease: Secondary | ICD-10-CM | POA: Diagnosis not present

## 2022-01-11 DIAGNOSIS — K219 Gastro-esophageal reflux disease without esophagitis: Secondary | ICD-10-CM | POA: Diagnosis not present

## 2022-01-11 DIAGNOSIS — I69351 Hemiplegia and hemiparesis following cerebral infarction affecting right dominant side: Secondary | ICD-10-CM | POA: Diagnosis not present

## 2022-01-11 DIAGNOSIS — Z7985 Long-term (current) use of injectable non-insulin antidiabetic drugs: Secondary | ICD-10-CM | POA: Diagnosis not present

## 2022-01-11 DIAGNOSIS — R339 Retention of urine, unspecified: Secondary | ICD-10-CM | POA: Diagnosis not present

## 2022-01-11 DIAGNOSIS — E1140 Type 2 diabetes mellitus with diabetic neuropathy, unspecified: Secondary | ICD-10-CM | POA: Diagnosis not present

## 2022-01-11 DIAGNOSIS — G2581 Restless legs syndrome: Secondary | ICD-10-CM | POA: Diagnosis not present

## 2022-01-11 DIAGNOSIS — E1122 Type 2 diabetes mellitus with diabetic chronic kidney disease: Secondary | ICD-10-CM | POA: Diagnosis not present

## 2022-01-11 NOTE — Telephone Encounter (Signed)
Noted  

## 2022-01-16 ENCOUNTER — Telehealth: Payer: Self-pay | Admitting: Family Medicine

## 2022-01-16 NOTE — Telephone Encounter (Signed)
Tried to call patient and unable to leave a message because mailbox was full. ?Will try to call patient again later. ?

## 2022-01-16 NOTE — Telephone Encounter (Signed)
Pt called and stated he needed to speak with the nurse about medication, pt did not know what the name of it was. Could barely hear him though the phone.  ? ?Callback Number: [phone] ?

## 2022-01-17 ENCOUNTER — Encounter: Payer: Self-pay | Admitting: Cardiovascular Disease

## 2022-01-17 ENCOUNTER — Ambulatory Visit (INDEPENDENT_AMBULATORY_CARE_PROVIDER_SITE_OTHER): Payer: Medicare Other | Admitting: Cardiovascular Disease

## 2022-01-17 VITALS — BP 124/68 | HR 78 | Ht 68.0 in | Wt 189.4 lb

## 2022-01-17 DIAGNOSIS — R001 Bradycardia, unspecified: Secondary | ICD-10-CM | POA: Diagnosis not present

## 2022-01-17 NOTE — Progress Notes (Signed)
?  ?Cardiology Office Note ? ? ?Date:  01/18/2022  ? ?ID:  James Spurr, MD, DOB [birth year], MRN [id number] ? ?PCP:  [name], MD  ?Cardiologist:   Kathlyn Sacramento, MD  ? ?Chief Complaint  ?Patient presents with  ? Follow-up  ? ? ?  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  He is a retired Chief Strategy Officer.  He has history of type 2 diabetes, chronic kidney disease, hypothyroidism and previous stroke.   ?The patient lives at [facility] independent living.  He was found on the floor by a neighbor in the bathroom and was covered in his own vomit.  The patient went to sleep the night before and did not get up out of bed until he was found by the neighbor.  He reported feeling weak the days preceding.  In the ED, he was found to be hypoglycemic with a blood sugar of 46 that improved with dextrose infusion.  In addition, he was found to have low-grade fever and tested positive for COVID.  His CPK was mildly elevated at 1187. ?During his hospital stay, he was noted to have intermittent bradycardia with some pauses the longest was 2.7 seconds.  Mobitz 2 second-degree AV block was also noted.  He had an echocardiogram done which showed normal LV systolic function with mildly calcified aortic valve without significant stenosis.  His TSH was normal.  I recommended 2 weeks ZIO monitor. ? ?He has been doing well with no chest pain, shortness of breath, palpitations, dizziness or syncope.  He feels close to baseline. ? ? ?Past Medical History:  ?Diagnosis Date  ? Anemia   ? Basal cell carcinoma 2018  ? R temple   ? Basal cell carcinoma of nose 2002  ? bridge of nose  ? BPH with obstruction/lower urinary tract symptoms   ? failed flomax, uroxatral, myrbetriq - sees urology [name]) pending videourodynamics  ? CKD (chronic kidney disease) stage 3, GFR 30-59 ml/min (HCC) 03/11/2012  ? Renal US 02/2014 - multiple kidney cysts   ?  CVA (cerebral infarction) 03/07/2012  ? slurred speech; right hemplegia, left handed - completed PT 07/2014 (17 sessions)  ? Dehydration   ? Depression   ? mild, on cymbalta per prior PCP  ? Erectile dysfunction   ? per urology (prostaglandin injection)  ? GERD (gastroesophageal reflux disease)   ? with sliding HH  ? H/O hiatal hernia   ? History of kidney problems 12/2010  ? lacerated left kidney after fall  ? HLD (hyperlipidemia)   ? Hx of basal cell carcinoma 07/10/2017  ? R. lat. forehead near hair line  ? Kidney cysts 02/2014  ? bilateral (renal US)  ? Migraines 03/11/2012  ? now rare  ? OSA on CPAP   ? sleep study 01/2014 - severe, CPAP at 10, previously on nuvigil (Dr. [name])  ? Palpitations 01/2012  ? holter WNL, rare PAC/PVCs  ? Presbycusis of both ears 2016  ? rec amplification [facility])  ? Restless leg syndrome   ? well controlled on mirapex  ? Rosacea   ? [name]  ? Stroke [facility])   ? TIA (transient ischemic attack)   ? Type 2 diabetes, controlled, with neuropathy ([place]) 2011  ? [name] in [place]  ? ? ?Past Surgical History:  ?Procedure Laterality Date  ? ABI  2007  ? WNL  ? CARDIAC CATHETERIZATION  2004  ? normal; [facility]  [place]  ? CARDIOVASCULAR STRESS TEST  02/2014  ? WNL, low risk scan, EF 68%  ? CAROTID ENDARTERECTOMY    ? CATARACT EXTRACTION W/ INTRAOCULAR LENS IMPLANT  11//2012 - 08/2011  ? COLONOSCOPY  03/2011  ? poor prep, unable to biopsy polyp in tic fold, rpt 3 mo [name])  ? COLONOSCOPY  06/2011  ? mult polyps adenomatous, severe diverticulosis, rpt 3 yrs [name])  ? CYSTOSCOPY WITH URETHRAL DILATATION N/A 07/24/2013  ? Procedure: CYSTOSCOPY WITH URETHRAL DILATATION;  Surgeon: [name], MD;  Location: [facility];  Service: Urology;  Laterality: N/A;  ? ESOPHAGOGASTRODUODENOSCOPY  2006  ? duodenitis, medual HH, Grade 2 reflux, mild gastritis  ? ESOPHAGOGASTRODUODENOSCOPY  07/2011  ? mild chronic gastritis  ? [place]  ? [place]; 2002  ? left, then repaired   ? MRI brain  04/2010  ? WNL  ? PROSTATE SURGERY  02/2009  ? PVP (laser)  ? SKIN CANCER EXCISION  2002  ? bridge of nose, BCC  ? TONSILLECTOMY AND ADENOIDECTOMY  ~ 1945  ? TRANSURETHRAL INCISION OF PROSTATE N/A 07/24/2013  ? Procedure: TRANSURETHRAL INCISION OF THE PROSTATE (TUIP);  Surgeon: [name], MD;  Location: [facility];  Service: Urology;  Laterality: N/A;  ? URETHRAL DILATION  2014  ? tannenbaum  ? ? ? ?Current Outpatient Medications  ?Medication Sig Dispense Refill  ? Alpha-Lipoic Acid 600 MG CAPS Take 1 capsule (600 mg total) by mouth daily. 30 capsule 11  ? brimonidine-timolol (COMBIGAN) 0.2-0.5 % ophthalmic solution Place 1 drop into both eyes every 12 (twelve) hours.    ? clopidogrel (PLAVIX) 75 MG tablet TAKE 1 TABLET DAILY 90 tablet 3  ? Coenzyme Q10 (COQ10) 150 MG CAPS Take 1 capsule by mouth daily.    ? DULoxetine (CYMBALTA) 60 MG capsule Take 1 capsule (60 mg total) by mouth daily. 90 capsule 3  ? famotidine (PEPCID) 20 MG tablet Take 1 tablet (20 mg total) by mouth at bedtime.    ? gabapentin (NEURONTIN) 300 MG capsule Take 2 capsules (600 mg total) by mouth 3 (three) times daily. 600 mg BID is his minimum dose without having breakthrough symptoms. He is aware 900 mg/day is recommended per kidney function.    ? insulin aspart (NOVOLOG) 100 UNIT/ML injection CBG 70 - 120: 0 units  ?CBG 121 - 150: 1 unit  ?CBG 151 - 200: 2 units  ?CBG 201 - 250: 3 units ? CBG 251 - 300: 5 units  ?CBG 301 - 350: 7 units  ?CBG 351 - 400: 9 units 10 mL 11  ? insulin degludec (TRESIBA FLEXTOUCH) 200 UNIT/ML FlexTouch Pen Inject 90 Units into the skin at bedtime.    ? Insulin Pen Needle (ULTICARE MICRO PEN NEEDLES) 31G X 8 MM MISC Use to inject medication daily 100 each 3  ? levothyroxine (SYNTHROID) 50 MCG tablet Take 1 tablet (50 mcg total) by mouth daily before breakfast. Per endo [name]    ? melatonin 5 MG TABS Take 5 mg by mouth at bedtime.    ? nitroGLYCERIN (NITROSTAT) 0.4 MG SL tablet DISSOLVE 1 TABLET  UNDER THE TONGUE EVERY 5 MINUTES AS NEEDED FOR CHEST PAIN 25 tablet 1  ? ondansetron (ZOFRAN) 4 MG tablet Take 1 tablet (4 mg total) by mouth every 8 (eight) hours as needed for nausea or vomiting. 30 tablet 0  ? polyethylene glycol (MIRALAX / GLYCOLAX) 17 g packet Take 17 g by mouth daily as  needed. 14 each 0  ? pramipexole (MIRAPEX) 1 MG tablet Take 1 tablet (1 mg total) by mouth daily. At 4pm 90 tablet 3  ? RABEprazole (ACIPHEX) 20 MG tablet Take 1 tablet (20 mg total) by mouth daily. 90 tablet 3  ? Semaglutide, 1 MG/DOSE, (OZEMPIC, 1 MG/DOSE,) 4 MG/3ML SOPN Inject 1 mg into the skin once a week.    ? sodium chloride (MURO 128) 5 % ophthalmic solution 1 drop as needed for eye irritation.    ? tamsulosin (FLOMAX) 0.4 MG CAPS capsule Take 1 capsule (0.4 mg total) by mouth daily after breakfast. 90 capsule 3  ? vitamin C (ASCORBIC ACID) 500 MG tablet Take 1,000 mg by mouth daily as needed.    ? acetaminophen (TYLENOL) 325 MG tablet Take 2 tablets (650 mg total) by mouth every 6 (six) hours as needed for mild pain (or Fever >/= 101). (Patient not taking: Reported on 01/17/2022)    ? fluticasone (FLOVENT HFA) 110 MCG/ACT inhaler Inhale 1 puff into the lungs in the morning and at bedtime. (Patient not taking: Reported on 01/17/2022) 1 each 0  ? mupirocin ointment (BACTROBAN) 2 % Place 1 application. into the nose 2 (two) times daily. (Patient not taking: Reported on 01/17/2022) 22 g 0  ? ?No current facility-administered medications for this visit.  ? ? ?Allergies:   Dulaglutide, Empagliflozin, Lisinopril, Nexium [esomeprazole], Oxycodone, Pregabalin, and Rosuvastatin  ? ? ?Social History:  The patient  reports that he has never smoked. He has never used smokeless tobacco. He reports current alcohol use of about 4.0 standard drinks per week. He reports that he does not use drugs.  ? ?Family History:  The patient's family history includes Alcoholism in his sister; Diabetes (age of onset: 22) in his brother; Kidney disease in  his sister; Lung cancer in his brother; Lung cancer (age of onset: 43) in his father; Stroke in his brother and father.  ? ? ?ROS:  Please see the history of present illness.   Otherwise, review of syste

## 2022-01-17 NOTE — Patient Instructions (Signed)
Medication Instructions:  No changes *If you need a refill on your cardiac medications before your next appointment, please call your pharmacy*   Lab Work: None ordered If you have labs (blood work) drawn today and your tests are completely normal, you will receive your results only by: MyChart Message (if you have MyChart) OR A paper copy in the mail If you have any lab test that is abnormal or we need to change your treatment, we will call you to review the results.   Testing/Procedures: None ordered   Follow-Up: At [facility], you and your health needs are our priority.  As part of our continuing mission to provide you with exceptional heart care, we have created designated Provider Care Teams.  These Care Teams include your primary Cardiologist (physician) and Advanced Practice Providers (APPs -  Physician Assistants and Nurse Practitioners) who all work together to provide you with the care you need, when you need it.  We recommend signing up for the patient portal called "MyChart".  Sign up information is provided on this After Visit Summary.  MyChart is used to connect with patients for Virtual Visits (Telemedicine).  Patients are able to view lab/test results, encounter notes, upcoming appointments, etc.  Non-urgent messages can be sent to your provider as well.   To learn more about what you can do with MyChart, go to [email or web address].    Your next appointment:   6 month(s)  The format for your next appointment:   In Person  Provider:   Dr. Arida  Important Information About Sugar       

## 2022-01-18 NOTE — Telephone Encounter (Signed)
Spoke to patient by telephone and was advised that he has another yeast infection in his groin area. Patient stated that the area is itching and burning. Patient stated that Dr. Danise Mina has given him medication several times for this and would like a script sent to his pharmacy. Patient was offered and an appointment which he declines stating that Dr. Danise Mina knows that this is a recurrent issue for him.  ?Pharmacy [facility] [address]. ?

## 2022-01-19 DIAGNOSIS — E1122 Type 2 diabetes mellitus with diabetic chronic kidney disease: Secondary | ICD-10-CM | POA: Diagnosis not present

## 2022-01-19 DIAGNOSIS — Z7902 Long term (current) use of antithrombotics/antiplatelets: Secondary | ICD-10-CM | POA: Diagnosis not present

## 2022-01-19 DIAGNOSIS — Z7985 Long-term (current) use of injectable non-insulin antidiabetic drugs: Secondary | ICD-10-CM | POA: Diagnosis not present

## 2022-01-19 DIAGNOSIS — Z794 Long term (current) use of insulin: Secondary | ICD-10-CM | POA: Diagnosis not present

## 2022-01-19 DIAGNOSIS — N1831 Chronic kidney disease, stage 3a: Secondary | ICD-10-CM | POA: Diagnosis not present

## 2022-01-19 DIAGNOSIS — I129 Hypertensive chronic kidney disease with stage 1 through stage 4 chronic kidney disease, or unspecified chronic kidney disease: Secondary | ICD-10-CM | POA: Diagnosis not present

## 2022-01-19 DIAGNOSIS — K5900 Constipation, unspecified: Secondary | ICD-10-CM | POA: Diagnosis not present

## 2022-01-19 DIAGNOSIS — E1140 Type 2 diabetes mellitus with diabetic neuropathy, unspecified: Secondary | ICD-10-CM | POA: Diagnosis not present

## 2022-01-19 DIAGNOSIS — G2581 Restless legs syndrome: Secondary | ICD-10-CM | POA: Diagnosis not present

## 2022-01-19 DIAGNOSIS — K219 Gastro-esophageal reflux disease without esophagitis: Secondary | ICD-10-CM | POA: Diagnosis not present

## 2022-01-19 DIAGNOSIS — G479 Sleep disorder, unspecified: Secondary | ICD-10-CM | POA: Diagnosis not present

## 2022-01-19 DIAGNOSIS — I69351 Hemiplegia and hemiparesis following cerebral infarction affecting right dominant side: Secondary | ICD-10-CM | POA: Diagnosis not present

## 2022-01-19 DIAGNOSIS — G8929 Other chronic pain: Secondary | ICD-10-CM | POA: Diagnosis not present

## 2022-01-19 DIAGNOSIS — Z87440 Personal history of urinary (tract) infections: Secondary | ICD-10-CM | POA: Diagnosis not present

## 2022-01-19 DIAGNOSIS — E039 Hypothyroidism, unspecified: Secondary | ICD-10-CM | POA: Diagnosis not present

## 2022-01-19 DIAGNOSIS — E785 Hyperlipidemia, unspecified: Secondary | ICD-10-CM | POA: Diagnosis not present

## 2022-01-19 DIAGNOSIS — R339 Retention of urine, unspecified: Secondary | ICD-10-CM | POA: Diagnosis not present

## 2022-01-20 DIAGNOSIS — H401132 Primary open-angle glaucoma, bilateral, moderate stage: Secondary | ICD-10-CM | POA: Diagnosis not present

## 2022-01-20 MED ORDER — FLUCONAZOLE 150 MG PO TABS: 150.0000 mg | ORAL_TABLET | ORAL | 0 refills | Status: DC

## 2022-01-20 NOTE — Telephone Encounter (Signed)
I've sent diflucan to his pharmacy. ?Plz ensure he's not taking jardiance or farxiga.  ?Let us know if not improved with this.  ?

## 2022-01-20 NOTE — Telephone Encounter (Signed)
Patient notified as instructed by telephone and verbalized understanding. Patient stated that he is not taking jardiance or farxiga. ?

## 2022-01-23 DIAGNOSIS — Z794 Long term (current) use of insulin: Secondary | ICD-10-CM | POA: Diagnosis not present

## 2022-01-23 DIAGNOSIS — Z7902 Long term (current) use of antithrombotics/antiplatelets: Secondary | ICD-10-CM | POA: Diagnosis not present

## 2022-01-23 DIAGNOSIS — Z7985 Long-term (current) use of injectable non-insulin antidiabetic drugs: Secondary | ICD-10-CM | POA: Diagnosis not present

## 2022-01-23 DIAGNOSIS — I129 Hypertensive chronic kidney disease with stage 1 through stage 4 chronic kidney disease, or unspecified chronic kidney disease: Secondary | ICD-10-CM | POA: Diagnosis not present

## 2022-01-23 DIAGNOSIS — E1140 Type 2 diabetes mellitus with diabetic neuropathy, unspecified: Secondary | ICD-10-CM | POA: Diagnosis not present

## 2022-01-23 DIAGNOSIS — Z87440 Personal history of urinary (tract) infections: Secondary | ICD-10-CM | POA: Diagnosis not present

## 2022-01-23 DIAGNOSIS — G479 Sleep disorder, unspecified: Secondary | ICD-10-CM | POA: Diagnosis not present

## 2022-01-23 DIAGNOSIS — E785 Hyperlipidemia, unspecified: Secondary | ICD-10-CM | POA: Diagnosis not present

## 2022-01-23 DIAGNOSIS — N1831 Chronic kidney disease, stage 3a: Secondary | ICD-10-CM | POA: Diagnosis not present

## 2022-01-23 DIAGNOSIS — E1122 Type 2 diabetes mellitus with diabetic chronic kidney disease: Secondary | ICD-10-CM | POA: Diagnosis not present

## 2022-01-23 DIAGNOSIS — K5900 Constipation, unspecified: Secondary | ICD-10-CM | POA: Diagnosis not present

## 2022-01-23 DIAGNOSIS — I69351 Hemiplegia and hemiparesis following cerebral infarction affecting right dominant side: Secondary | ICD-10-CM | POA: Diagnosis not present

## 2022-01-23 DIAGNOSIS — G2581 Restless legs syndrome: Secondary | ICD-10-CM | POA: Diagnosis not present

## 2022-01-23 DIAGNOSIS — E039 Hypothyroidism, unspecified: Secondary | ICD-10-CM | POA: Diagnosis not present

## 2022-01-23 DIAGNOSIS — K219 Gastro-esophageal reflux disease without esophagitis: Secondary | ICD-10-CM | POA: Diagnosis not present

## 2022-01-23 DIAGNOSIS — R339 Retention of urine, unspecified: Secondary | ICD-10-CM | POA: Diagnosis not present

## 2022-01-23 DIAGNOSIS — E1165 Type 2 diabetes mellitus with hyperglycemia: Secondary | ICD-10-CM | POA: Diagnosis not present

## 2022-01-23 DIAGNOSIS — G8929 Other chronic pain: Secondary | ICD-10-CM | POA: Diagnosis not present

## 2022-01-24 ENCOUNTER — Telehealth: Payer: Self-pay | Admitting: *Deleted

## 2022-01-24 ENCOUNTER — Telehealth: Payer: Self-pay | Admitting: Family Medicine

## 2022-01-24 DIAGNOSIS — I69351 Hemiplegia and hemiparesis following cerebral infarction affecting right dominant side: Secondary | ICD-10-CM | POA: Diagnosis not present

## 2022-01-24 DIAGNOSIS — E1140 Type 2 diabetes mellitus with diabetic neuropathy, unspecified: Secondary | ICD-10-CM | POA: Diagnosis not present

## 2022-01-24 DIAGNOSIS — Z794 Long term (current) use of insulin: Secondary | ICD-10-CM | POA: Diagnosis not present

## 2022-01-24 DIAGNOSIS — R339 Retention of urine, unspecified: Secondary | ICD-10-CM | POA: Diagnosis not present

## 2022-01-24 DIAGNOSIS — G8929 Other chronic pain: Secondary | ICD-10-CM | POA: Diagnosis not present

## 2022-01-24 DIAGNOSIS — E039 Hypothyroidism, unspecified: Secondary | ICD-10-CM | POA: Diagnosis not present

## 2022-01-24 DIAGNOSIS — K5900 Constipation, unspecified: Secondary | ICD-10-CM | POA: Diagnosis not present

## 2022-01-24 DIAGNOSIS — Z7902 Long term (current) use of antithrombotics/antiplatelets: Secondary | ICD-10-CM | POA: Diagnosis not present

## 2022-01-24 DIAGNOSIS — Z7985 Long-term (current) use of injectable non-insulin antidiabetic drugs: Secondary | ICD-10-CM | POA: Diagnosis not present

## 2022-01-24 DIAGNOSIS — K219 Gastro-esophageal reflux disease without esophagitis: Secondary | ICD-10-CM | POA: Diagnosis not present

## 2022-01-24 DIAGNOSIS — G2581 Restless legs syndrome: Secondary | ICD-10-CM | POA: Diagnosis not present

## 2022-01-24 DIAGNOSIS — I129 Hypertensive chronic kidney disease with stage 1 through stage 4 chronic kidney disease, or unspecified chronic kidney disease: Secondary | ICD-10-CM | POA: Diagnosis not present

## 2022-01-24 DIAGNOSIS — N1831 Chronic kidney disease, stage 3a: Secondary | ICD-10-CM | POA: Diagnosis not present

## 2022-01-24 DIAGNOSIS — E785 Hyperlipidemia, unspecified: Secondary | ICD-10-CM | POA: Diagnosis not present

## 2022-01-24 DIAGNOSIS — E1122 Type 2 diabetes mellitus with diabetic chronic kidney disease: Secondary | ICD-10-CM | POA: Diagnosis not present

## 2022-01-24 DIAGNOSIS — G479 Sleep disorder, unspecified: Secondary | ICD-10-CM | POA: Diagnosis not present

## 2022-01-24 DIAGNOSIS — Z87440 Personal history of urinary (tract) infections: Secondary | ICD-10-CM | POA: Diagnosis not present

## 2022-01-24 NOTE — Telephone Encounter (Signed)
Agree with this. Thanks.  

## 2022-01-24 NOTE — Telephone Encounter (Signed)
Home Health Verbal Orders ?Caller Name: Marlowe Kays  ?Agency Name: [facility] ? ?Callback number: [phone] ? ?Requesting: OT ? ?Reason: Exercises  ? ?Frequency: Twice a week for two weeks ? ?Please forward to [place] pool or providers CMA  ?

## 2022-01-24 NOTE — Telephone Encounter (Signed)
Patient came in for an office visit and asked that we remove the cardiac monitor for him. He did not have the return envelope or case that it should be mailed back in. Call placed to [facility] for return envelope. ? ?Returned envelope has been received and monitor has been mailed back.  ?

## 2022-01-24 NOTE — Telephone Encounter (Signed)
Marlowe Kays at [facility] notified as instructed by telephone and verbalized understanding.  ?

## 2022-01-30 ENCOUNTER — Telehealth: Payer: Self-pay

## 2022-01-30 DIAGNOSIS — G8929 Other chronic pain: Secondary | ICD-10-CM | POA: Diagnosis not present

## 2022-01-30 DIAGNOSIS — N1831 Chronic kidney disease, stage 3a: Secondary | ICD-10-CM | POA: Diagnosis not present

## 2022-01-30 DIAGNOSIS — E1122 Type 2 diabetes mellitus with diabetic chronic kidney disease: Secondary | ICD-10-CM | POA: Diagnosis not present

## 2022-01-30 DIAGNOSIS — K219 Gastro-esophageal reflux disease without esophagitis: Secondary | ICD-10-CM | POA: Diagnosis not present

## 2022-01-30 DIAGNOSIS — Z7985 Long-term (current) use of injectable non-insulin antidiabetic drugs: Secondary | ICD-10-CM | POA: Diagnosis not present

## 2022-01-30 DIAGNOSIS — I69351 Hemiplegia and hemiparesis following cerebral infarction affecting right dominant side: Secondary | ICD-10-CM | POA: Diagnosis not present

## 2022-01-30 DIAGNOSIS — Z7902 Long term (current) use of antithrombotics/antiplatelets: Secondary | ICD-10-CM | POA: Diagnosis not present

## 2022-01-30 DIAGNOSIS — K5900 Constipation, unspecified: Secondary | ICD-10-CM | POA: Diagnosis not present

## 2022-01-30 DIAGNOSIS — E785 Hyperlipidemia, unspecified: Secondary | ICD-10-CM | POA: Diagnosis not present

## 2022-01-30 DIAGNOSIS — Z87440 Personal history of urinary (tract) infections: Secondary | ICD-10-CM | POA: Diagnosis not present

## 2022-01-30 DIAGNOSIS — G2581 Restless legs syndrome: Secondary | ICD-10-CM | POA: Diagnosis not present

## 2022-01-30 DIAGNOSIS — G479 Sleep disorder, unspecified: Secondary | ICD-10-CM | POA: Diagnosis not present

## 2022-01-30 DIAGNOSIS — Z794 Long term (current) use of insulin: Secondary | ICD-10-CM | POA: Diagnosis not present

## 2022-01-30 DIAGNOSIS — R339 Retention of urine, unspecified: Secondary | ICD-10-CM | POA: Diagnosis not present

## 2022-01-30 DIAGNOSIS — E1140 Type 2 diabetes mellitus with diabetic neuropathy, unspecified: Secondary | ICD-10-CM | POA: Diagnosis not present

## 2022-01-30 DIAGNOSIS — E039 Hypothyroidism, unspecified: Secondary | ICD-10-CM | POA: Diagnosis not present

## 2022-01-30 DIAGNOSIS — I129 Hypertensive chronic kidney disease with stage 1 through stage 4 chronic kidney disease, or unspecified chronic kidney disease: Secondary | ICD-10-CM | POA: Diagnosis not present

## 2022-01-30 NOTE — Chronic Care Management (AMB) (Signed)
? ? ?Chronic Care Management ?Pharmacy Assistant  ? ?Name: James WEIGHTMAN, James Bell  MRN: [id number] DOB: Nov 14, 1930 ? ?James Spurr, James Bell is an 88 y.o. year old male who presents for his follow-up CCM visit with the clinical pharmacist. ? ?Reason for Encounter: Reminder Call ?  ?Conditions to be addressed/monitored: ?HTN, HLD, and DMII ? ? ?Recent office visits:  ?01/06/22-Javier Gutierrez,James Bell(PCP)-telemedicine-Hospital F/U-[facility] of [facility],  ?  ?Pt states latest regimen change was stopping lantus and starting ozempic - he is currently on '1mg'$  weekly dose and tolerating well, he continues novolog per [facility] (Dr.Kerr) He did complete paxlovid course (positive Covid-19) Discussed isolation/mask wearing guidelines.  ?12/13/21-Javier Gutierrez,James Bell(PCP)- hospital F/U pneumonia, Will restart Aciphex., instead of pantoprazole, go ahead and start probiotic you have at home. Follow up 6 months                                                               ? ?Recent consult visits:  ?01/17/22-Muhammad Arida,James Bell(cardio)f/u bradycardia-wearing zio monitor follow up 6 months ?11/29/21-Dr.Athar(neuro)- follow up OSA, living at [facility] supplies for CPAP ordered ?10/19/21-Ronald Laney,James Bell-[facility]- Left foot infection-Keep wound clean, dry, and covered. F/U podiatry ? ?Hospital visits:  ?Medication Reconciliation was completed by comparing discharge summary, patient?s EMR and Pharmacy list, and upon discussion with patient. ? ?Admitted to the hospital on 01/01/22 due to syncope and collaspe. Discharge date was 01/03/22. Discharged from [facility].   ? ?New?Medications Started at [facility] Discharge:?? ?-started Bactroban  ? Paxlovid ? ?Medications that remain the same after Hospital Discharge:??  ?-All other medications will remain the same.   ?Medication Reconciliation was completed by comparing discharge summary, patient?s EMR and Pharmacy list, and upon discussion with patient. ? ?Admitted to the hospital on 11/21/21 due  to pneumonia. Discharge date was 11/24/21. Discharged from [facility].   ? ?New?Medications Started at [facility] Discharge:?? ?-started amoxicillan ?Guafenesin ? ?Medication Changes at Hospital Discharge: ?-Changed Gabapentin   ? ?Medications Discontinued at Hospital Discharge: ?-Stopped celebrex ?Aciphe ?Medications that remain the same after Hospital Discharge:??  ?-All other medications will remain the same.    ? ?Medication Reconciliation was completed by comparing discharge summary, patient?s EMR and Pharmacy list, and upon discussion with patient. ? ?Admitted to the hospital on 10/25/21 due to dizziness and syncope,cellulitis of foot, Discharge date was 11/02/21. Discharged from [facility].   ? ?New?Medications Started at [facility] Discharge:?? ?-started acetaminophen ?Bactroban ?Vancomycin ?Medication Changes at Hospital Discharge: ?-Changed Novolog ? Lantus ? ?Medications that remain the same after Hospital Discharge:??  ?-All other medications will remain the same.    ? ?Medications: ?Outpatient Encounter Medications as of 01/30/2022  ?Medication Sig Note  ? acetaminophen (TYLENOL) 325 MG tablet Take 2 tablets (650 mg total) by mouth every 6 (six) hours as needed for mild pain (or Fever >/= 101). (Patient not taking: Reported on 01/17/2022)   ? Alpha-Lipoic Acid 600 MG CAPS Take 1 capsule (600 mg total) by mouth daily. 01/01/2022: Recent Dispenses ? ?   ?11/02/2021 600 MG CAPS (disp 60, 60d supply ? ?  ? brimonidine-timolol (COMBIGAN) 0.2-0.5 % ophthalmic solution Place 1 drop into both eyes every 12 (twelve) hours.   ? clopidogrel (PLAVIX) 75 MG tablet TAKE 1 TABLET DAILY 01/01/2022: Recent Dispenses ? ?   ?12/08/2021 75  MG TABS (disp 90, 90d supply) ? ?  ? Coenzyme Q10 (COQ10) 150 MG CAPS Take 1 capsule by mouth daily. 01/01/2022: Recent Dispenses ? ?   ?11/24/2021 100 MG CAPS (disp 8, 8d supply) ? ?  ? DULoxetine (CYMBALTA) 60 MG capsule Take 1 capsule (60 mg total) by mouth daily. 01/01/2022: Recent  Dispenses ? ?   ?12/02/2021 60 MG CPEP (disp 90, 90d supply) ? ?  ? famotidine (PEPCID) 20 MG tablet Take 1 tablet (20 mg total) by mouth at bedtime. 01/01/2022: Recent Dispenses ? ?   ?11/24/2021 20 MG TABS (disp 8, 8d supply) ? ?  ? fluconazole (DIFLUCAN) 150 MG tablet Take 1 tablet (150 mg total) by mouth once a week.   ? fluticasone (FLOVENT HFA) 110 MCG/ACT inhaler Inhale 1 puff into the lungs in the morning and at bedtime. (Patient not taking: Reported on 01/17/2022)   ? gabapentin (NEURONTIN) 300 MG capsule Take 2 capsules (600 mg total) by mouth 3 (three) times daily. 600 mg BID is his minimum dose without having breakthrough symptoms. He is aware 900 mg/day is recommended per kidney function. 01/01/2022: Recent Dispenses ? ?   ?11/24/2021 600 MG TABS (disp 24, 8d supply) ? ?  ? insulin aspart (NOVOLOG) 100 UNIT/ML injection CBG 70 - 120: 0 units  ?CBG 121 - 150: 1 unit  ?CBG 151 - 200: 2 units  ?CBG 201 - 250: 3 units ? CBG 251 - 300: 5 units  ?CBG 301 - 350: 7 units  ?CBG 351 - 400: 9 units 01/01/2022: Recent Dispenses ? ?   ?12/28/2021 100 UNIT/ML SOPN (disp 75, 90d supply) ? ?  ? insulin degludec (TRESIBA FLEXTOUCH) 200 UNIT/ML FlexTouch Pen Inject 90 Units into the skin at bedtime.   ? Insulin Pen Needle (ULTICARE MICRO PEN NEEDLES) 31G X 8 MM MISC Use to inject medication daily   ? levothyroxine (SYNTHROID) 50 MCG tablet Take 1 tablet (50 mcg total) by mouth daily before breakfast. Per endo [name] 01/01/2022: Recent Dispenses ? ?   ?11/24/2021 50 MCG TABS (disp 8, 8d supply ? ?  ? melatonin 5 MG TABS Take 5 mg by mouth at bedtime. 01/01/2022: Recent Dispenses ? ?   ?11/24/2021 5 MG TABS (disp 8, 8d supply) ? ?  ? mupirocin ointment (BACTROBAN) 2 % Place 1 application. into the nose 2 (two) times daily. (Patient not taking: Reported on 01/17/2022)   ? nitroGLYCERIN (NITROSTAT) 0.4 MG SL tablet DISSOLVE 1 TABLET UNDER THE TONGUE EVERY 5 MINUTES AS NEEDED FOR CHEST PAIN 01/01/2022: Recent Dispenses ? ?    ?11/24/2021 0.4 MG SUBL (disp 25, 10d supply) ? ?  ? ondansetron (ZOFRAN) 4 MG tablet Take 1 tablet (4 mg total) by mouth every 8 (eight) hours as needed for nausea or vomiting. 01/01/2022: Recent Dispenses ? ?   ?12/13/2021 4 MG TABS (disp 30, 10d supply) ? ?  ? polyethylene glycol (MIRALAX / GLYCOLAX) 17 g packet Take 17 g by mouth daily as needed.   ? pramipexole (MIRAPEX) 1 MG tablet Take 1 tablet (1 mg total) by mouth daily. At 4pm 01/01/2022: Recent Dispenses ? ?   ?11/24/2021 1 MG TABS (disp 8, 8d supply) ? ?  ? RABEprazole (ACIPHEX) 20 MG tablet Take 1 tablet (20 mg total) by mouth daily. 01/01/2022: Recent Dispenses ? ?   ?11/09/2021 20 MG TBEC (disp 90, 90d supply) ? ?  ? Semaglutide, 1 MG/DOSE, (OZEMPIC, 1 MG/DOSE,) 4 MG/3ML SOPN Inject 1 mg into the  skin once a week.   ? sodium chloride (MURO 128) 5 % ophthalmic solution 1 drop as needed for eye irritation. 01/01/2022: Recent Dispenses ? ?   ?11/24/2021 5 % SOLN (disp 15, 30d supply) ? ?  ? tamsulosin (FLOMAX) 0.4 MG CAPS capsule Take 1 capsule (0.4 mg total) by mouth daily after breakfast.   ? vitamin C (ASCORBIC ACID) 500 MG tablet Take 1,000 mg by mouth daily as needed. 01/01/2022: Recent Dispenses ? ?   ?11/24/2021 1000 MG TABS (disp 15, 15d supply) ? ?  ? ?No facility-administered encounter medications on file as of 01/30/2022.  ? ? ?James Spurr, James Bell was contacted to remind of upcoming telephone visit with Charlene Brooke on 02/02/22 at 11:00am. Patient was reminded to have any blood glucose and blood pressure readings available for review at appointment.  ? ?Message was left reminding patient of appointment. ? ?Star Rating Drugs: ?Medication:  Last Fill: Day Supply ?Tresiba 200  10/04/21 90 ?Ozempic '1mg'$  ?Novolog 100  12/28/21 90 ?Insulin Glargine u100   11/21/21  30 ? ?Charlene Brooke, CPP notified ? ?Kym Scannell, CCMA ?Health concierge  ?[phone]  ?

## 2022-01-30 NOTE — Addendum Note (Signed)
Encounter addended by: Jeannette How on: 01/30/2022 7:51 AM ? Actions taken: Imaging Exam ended

## 2022-02-01 DIAGNOSIS — E039 Hypothyroidism, unspecified: Secondary | ICD-10-CM | POA: Diagnosis not present

## 2022-02-01 DIAGNOSIS — N1831 Chronic kidney disease, stage 3a: Secondary | ICD-10-CM | POA: Diagnosis not present

## 2022-02-01 DIAGNOSIS — G479 Sleep disorder, unspecified: Secondary | ICD-10-CM | POA: Diagnosis not present

## 2022-02-01 DIAGNOSIS — Z794 Long term (current) use of insulin: Secondary | ICD-10-CM | POA: Diagnosis not present

## 2022-02-01 DIAGNOSIS — I129 Hypertensive chronic kidney disease with stage 1 through stage 4 chronic kidney disease, or unspecified chronic kidney disease: Secondary | ICD-10-CM | POA: Diagnosis not present

## 2022-02-01 DIAGNOSIS — G2581 Restless legs syndrome: Secondary | ICD-10-CM | POA: Diagnosis not present

## 2022-02-01 DIAGNOSIS — K219 Gastro-esophageal reflux disease without esophagitis: Secondary | ICD-10-CM | POA: Diagnosis not present

## 2022-02-01 DIAGNOSIS — Z87440 Personal history of urinary (tract) infections: Secondary | ICD-10-CM | POA: Diagnosis not present

## 2022-02-01 DIAGNOSIS — Z7902 Long term (current) use of antithrombotics/antiplatelets: Secondary | ICD-10-CM | POA: Diagnosis not present

## 2022-02-01 DIAGNOSIS — I69351 Hemiplegia and hemiparesis following cerebral infarction affecting right dominant side: Secondary | ICD-10-CM | POA: Diagnosis not present

## 2022-02-01 DIAGNOSIS — Z7985 Long-term (current) use of injectable non-insulin antidiabetic drugs: Secondary | ICD-10-CM | POA: Diagnosis not present

## 2022-02-01 DIAGNOSIS — E1122 Type 2 diabetes mellitus with diabetic chronic kidney disease: Secondary | ICD-10-CM | POA: Diagnosis not present

## 2022-02-01 DIAGNOSIS — E1140 Type 2 diabetes mellitus with diabetic neuropathy, unspecified: Secondary | ICD-10-CM | POA: Diagnosis not present

## 2022-02-01 DIAGNOSIS — E785 Hyperlipidemia, unspecified: Secondary | ICD-10-CM | POA: Diagnosis not present

## 2022-02-01 DIAGNOSIS — G8929 Other chronic pain: Secondary | ICD-10-CM | POA: Diagnosis not present

## 2022-02-01 DIAGNOSIS — R339 Retention of urine, unspecified: Secondary | ICD-10-CM | POA: Diagnosis not present

## 2022-02-01 DIAGNOSIS — K5900 Constipation, unspecified: Secondary | ICD-10-CM | POA: Diagnosis not present

## 2022-02-02 ENCOUNTER — Telehealth: Payer: Self-pay | Admitting: Pharmacist

## 2022-02-02 ENCOUNTER — Telehealth: Payer: Medicare Other

## 2022-02-02 ENCOUNTER — Encounter: Payer: Self-pay | Admitting: Family Medicine

## 2022-02-02 DIAGNOSIS — I471 Supraventricular tachycardia: Secondary | ICD-10-CM | POA: Insufficient documentation

## 2022-02-02 NOTE — Progress Notes (Deleted)
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  The CCM team was consulted for assistance with disease management and care coordination needs.    Engaged with patient by telephone for follow up visit in response to provider referral for pharmacy case management and/or care coordination services.   Consent to Services:  The patient was given information about Chronic Care Management services, agreed to services, and gave verbal consent prior to initiation of services.  Please see initial visit note for detailed documentation.   Patient Care Team: [name], MD as PCP - General (Family Medicine) Wellington Hampshire, MD as PCP - Cardiology (Cardiology) Delrae Rend, MD as Consulting Physician (Endocrinology) Dingeldein, Remo Lipps, MD as Consulting Physician (Ophthalmology) Pieter Partridge, DO as Consulting Physician (Neurology) Letta Pate Luanna Salk, MD as Consulting Physician (Physical Medicine and Rehabilitation) [name], [facility] as Pharmacist (Pharmacist)  Recent office visits: 01/06/22-James Gutierrez,MD(PCP)-telemedicine-Hospital F/U-Resident of [facility], Pt states latest regimen change was stopping lantus and starting ozempic - he is currently on $RemoveBefo'1mg'rpQustOtLOT$  weekly dose and tolerating well, he continues novolog per [facility] (Dr.Kerr) He did complete paxlovid course (positive Covid-19) Discussed isolation/mask wearing guidelines  12/13/21-James Gutierrez,MD(PCP)- hospital F/U pneumonia, Will restart Aciphex., instead of pantoprazole, go ahead and start probiotic you have at home. Follow up 6 months    Recent consult visits: 01/17/22-James Arida,MD(cardio)f/u bradycardia-wearing zio monitor follow up 6  months 11/29/21-Dr.Athar(neuro)- follow up OSA, living at [facility] supplies for CPAP ordered 10/19/21-James Laney,MD-[facility]- Left foot infection-Keep wound clean, dry, and covered. F/U podiatry  Hospital visits: 01/01/22-01/03/22 Admission [facility]): syncope and collapse - hypoglycemia in s/o COVID.  Tx'd with Paxlovid. Placed zio monitor at D/c  11/21/21-11/24/21 Admission [facility]):  Aspiration PNA - discharged on Augmentin. SLP rec regular diet. Discharged to SNF.   10/25/21-11/02/21 Admission [facility]): dizziness/presyncope 2/2 Cdiff/colitis. Tx oral vancomycin.  Objective:  Lab Results  Component Value Date   CREATININE 1.41 (H) 01/03/2022   BUN 34 (H) 01/03/2022   GFR 45.05 (L) 06/13/2021   GFRNONAA 48 (L) 01/03/2022   GFRAA 54 (L) 04/21/2020   NA 136 01/03/2022   K 4.6 01/03/2022   CALCIUM 8.2 (L) 01/03/2022   CO2 21 (L) 01/03/2022   GLUCOSE 195 (H) 01/03/2022    Lab Results  Component Value Date/Time   HGBA1C 7.2 (H) 10/25/2021 05:05 PM   HGBA1C 8.5 (H) 04/02/2021 10:43 AM   HGBA1C 8.6 12/05/2019 12:00 AM   HGBA1C 7.9 08/21/2019 12:00 AM   FRUCTOSAMINE 362 (H) 06/13/2021 03:28 PM   GFR 45.05 (L) 06/13/2021 03:28 PM   GFR 41.64 (L) 11/10/2020 01:08 PM   MICROALBUR 4.8 (H) 06/13/2021 03:28 PM   MICROALBUR 2.0 (H) 04/21/2013 11:19 AM    Last diabetic Eye exam:  Lab Results  Component Value Date/Time   HMDIABEYEEXA No Retinopathy 11/23/2020 12:00 AM    Last diabetic Foot exam: No results found for: HMDIABFOOTEX   Lab Results  Component Value Date   CHOL 105 06/13/2021   HDL 18.50 (L) 06/13/2021   LDLCALC 53 12/05/2019   LDLDIRECT 57.0 06/13/2021   TRIG 209.0 (H) 06/13/2021   CHOLHDL 6 06/13/2021       Latest Ref Rng & Units 01/02/2022    4:09 AM 01/01/2022  1:18 PM 11/23/2021    5:10 AM  Hepatic Function  Total Protein 6.5 - 8.1 g/dL 6.3   7.2   6.1    Albumin 3.5 - 5.0 g/dL 2.9   3.4   2.6    AST 15 - 41 U/L 39   37   13    ALT 0 - 44 U/L 18   17   10      Alk Phosphatase 38 - 126 U/L 33   44   43    Total Bilirubin 0.3 - 1.2 mg/dL 0.7   0.9   0.7      Lab Results  Component Value Date/Time   TSH 2.546 01/01/2022 01:18 PM   TSH 2.763 11/03/2020 04:43 AM   TSH 0.90 04/15/2020 07:33 AM   TSH 4.37 10/30/2018 10:28 AM   FREET4 0.75 10/30/2018 10:28 AM   FREET4 0.72 07/18/2017 01:51 PM       Latest Ref Rng & Units 01/03/2022    5:32 AM 01/02/2022    4:09 AM 01/01/2022   11:18 AM  CBC  WBC 4.0 - 10.5 K/uL 9.1   7.4   9.5    Hemoglobin 13.0 - 17.0 g/dL 12.8   12.7   14.7    Hematocrit 39.0 - 52.0 % 39.5   39.6   47.5    Platelets 150 - 400 K/uL 148   141   148      Lab Results  Component Value Date/Time   VD25OH 29.23 (L) 11/21/2021 11:30 PM   VD25OH 53.89 06/13/2021 03:28 PM   VD25OH 40.46 04/15/2020 07:33 AM    Clinical ASCVD: Yes  The ASCVD Risk score (Arnett DK, et al., 2019) failed to calculate for the following reasons:   The 2019 ASCVD risk score is only valid for ages 19 to 61       06/13/2021    3:52 PM 06/01/2020   12:39 PM 04/22/2020   10:01 AM  Depression screen PHQ 2/9  Decreased Interest 3 0 0  Down, Depressed, Hopeless 0 0 0  PHQ - 2 Score 3 0 0  Altered sleeping 0 1 1  Tired, decreased energy 2 1 1   Change in appetite 0 1 1  Feeling bad or failure about yourself  0 0 0  Trouble concentrating 1 1 2   Moving slowly or fidgety/restless 1 0 3  Suicidal thoughts 0 0 0  PHQ-9 Score 7 4 8      Social History   Tobacco Use  Smoking Status Never  Smokeless Tobacco Never  Tobacco Comments   "used to smoke pipe when I was younger"   BP Readings from Last 3 Encounters:  01/17/22 124/68  01/06/22 138/85  01/03/22 (!) 166/89   Pulse Readings from Last 3 Encounters:  01/17/22 78  01/06/22 81  01/03/22 (!) 48   Wt Readings from Last 3 Encounters:  01/17/22 189 lb 6.4 oz (85.9 kg)  01/03/22 191 lb 5.8 oz (86.8 kg)  12/13/21 188 lb 6 oz (85.4 kg)   BMI Readings from Last 3 Encounters:  01/17/22 28.80  kg/m  01/06/22 29.10 kg/m  01/03/22 29.10 kg/m    Assessment/Interventions: Review of patient past medical history, allergies, medications, health status, including review of consultants reports, laboratory and other test data, was performed as part of comprehensive evaluation and provision of chronic care management services.   SDOH:  (Social Determinants of Health) assessments and interventions performed: {yes/no:20286}  SDOH Screenings   Alcohol Screen: Not on  file  Depression (PHQ2-9): Medium Risk   PHQ-2 Score: 7  Financial Resource Strain: Not on file  Food Insecurity: Not on file  Housing: Not on file  Physical Activity: Not on file  Social Connections: Not on file  Stress: Not on file  Tobacco Use: Low Risk    Smoking Tobacco Use: Never   Smokeless Tobacco Use: Never   Passive Exposure: Not on file  Transportation Needs: Not on file    CCM Care Plan  Allergies  Allergen Reactions   Dulaglutide     Other reaction(s): upset stomach Other reaction(s): Abdominal Pain, upset stomach Other reaction(s): upset stomach    Empagliflozin Other (See Comments)    Other reaction(s): yeast infection Other reaction(s): Other (See Comments) Other reaction(s): yeast infection    Lisinopril Cough    tachycardia   Nexium [Esomeprazole] Other (See Comments)    Headache   Oxycodone Other (See Comments)    Even 1/2 tablet of $RemoveB'5mg'zfrRIMYz$  caused increased confusion - contributory to hospitalization 10/2020 with [facility]   Pregabalin Other (See Comments) and Nausea Only    Other reaction(s): MALAISE, SLIGHT NAUSEA, SLIGHT HEADACHE Other reaction(s): MALAISE, SLIGHT NAUSEA, SLIGHT HEADACHE    Rosuvastatin Other (See Comments)    Other reaction(s): muscle pain 2.16.2022 Pt is current on this medication without any issues. Other reaction(s): muscle pain 2.16.2022 Pt is current on this medication without any issues. Other reaction(s): muscle pain    Medications Reviewed Today     Reviewed  by Wellington Hampshire, MD (Physician) on 01/18/22 at 1423  Med List Status: <None>   Medication Order Taking? Sig Documenting Provider Last Dose Status Informant  acetaminophen (TYLENOL) 325 MG tablet [id number] No Take 2 tablets (650 mg total) by mouth every 6 (six) hours as needed for mild pain (or Fever >/= 101).  Patient not taking: Reported on 01/17/2022   Eugenie Filler, MD Not Taking Active Pharmacy Records, Multiple Informants  Alpha-Lipoic Acid 600 MG CAPS [id number] Yes Take 1 capsule (600 mg total) by mouth daily. [name], MD Taking Active Pharmacy Records, Multiple Informants           Med Note Carlye Grippe, Orland Penman Jan 01, 2022  2:44 PM) Recent Dispenses     11/02/2021 600 MG CAPS (disp 60, 60d supply    brimonidine-timolol (COMBIGAN) 0.2-0.5 % ophthalmic solution [id number] Yes Place 1 drop into both eyes every 12 (twelve) hours. [provider] Taking Active Self  clopidogrel (PLAVIX) 75 MG tablet [id number] Yes TAKE 1 TABLET DAILY [name], MD Taking Active Pharmacy Records, Multiple Informants           Med Note Carlye Grippe, Orland Penman Jan 01, 2022  2:45 PM) Recent Dispenses     12/08/2021 75 MG TABS (disp 90, 90d supply)    Coenzyme Q10 (COQ10) 150 MG CAPS [id number] Yes Take 1 capsule by mouth daily. [provider] Taking Active Pharmacy Records, Multiple Informants           Med Note Miki Kins Jan 01, 2022  2:45 PM) Recent Dispenses     11/24/2021 100 MG CAPS (disp 8, 8d supply)    DULoxetine (CYMBALTA) 60 MG capsule [id number] Yes Take 1 capsule (60 mg total) by mouth daily. [name], MD Taking Active Pharmacy Records, Multiple Informants           Med Note Carlye Grippe, Orland Penman Jan 01, 2022  2:45 PM) Recent Dispenses  12/02/2021 60 MG CPEP (disp 90, 90d supply)    famotidine (PEPCID) 20 MG tablet [id number] Yes Take 1 tablet (20 mg total) by mouth at bedtime. Hosie Poisson, MD Taking  Active Pharmacy Records, Multiple Informants           Med Note Carlye Grippe, Orland Penman Jan 01, 2022  2:45 PM) Recent Dispenses     11/24/2021 20 MG TABS (disp 8, 8d supply)    fluticasone (FLOVENT HFA) 110 MCG/ACT inhaler [id number] No Inhale 1 puff into the lungs in the morning and at bedtime.  Patient not taking: Reported on 01/17/2022   [name], MD Not Taking Active   gabapentin (NEURONTIN) 300 MG capsule [id number] Yes Take 2 capsules (600 mg total) by mouth 3 (three) times daily. 600 mg BID is his minimum dose without having breakthrough symptoms. He is aware 900 mg/day is recommended per kidney function. Modena Jansky, MD Taking Active Pharmacy Records, Multiple Informants           Med Note Carlye Grippe, Orland Penman Jan 01, 2022  2:46 PM) Recent Dispenses     11/24/2021 600 MG TABS (disp 24, 8d supply)    insulin aspart (NOVOLOG) 100 UNIT/ML injection [id number] Yes CBG 70 - 120: 0 units  CBG 121 - 150: 1 unit  CBG 151 - 200: 2 units  CBG 201 - 250: 3 units  CBG 251 - 300: 5 units  CBG 301 - 350: 7 units  CBG 351 - 400: 9 units Hosie Poisson, MD Taking Active Pharmacy Records, Multiple Informants           Med Note Carlye Grippe, Orland Penman Jan 01, 2022  2:46 PM) Recent Dispenses     12/28/2021 100 UNIT/ML SOPN (disp 75, 90d supply)    insulin degludec (TRESIBA FLEXTOUCH) 200 UNIT/ML FlexTouch Pen [id number] Yes Inject 90 Units into the skin at bedtime. [provider] Taking Active   Insulin Pen Needle (ULTICARE MICRO PEN NEEDLES) 31G X 8 MM MISC [id number] Yes Use to inject medication daily [name], MD Taking Active Pharmacy Records, Multiple Informants  levothyroxine (SYNTHROID) 50 MCG tablet [id number] Yes Take 1 tablet (50 mcg total) by mouth daily before breakfast. Per endo Dr Trish Fountain, MD Taking Active Pharmacy Records, Multiple Informants           Med Note Carlye Grippe, Orland Penman Jan 01, 2022  2:46 PM) Recent  Dispenses     11/24/2021 50 MCG TABS (disp 8, 8d supply    melatonin 5 MG TABS [id number] Yes Take 5 mg by mouth at bedtime. [provider] Taking Active Pharmacy Records, Multiple Informants           Med Note Miki Kins Jan 01, 2022  2:46 PM) Recent Dispenses     11/24/2021 5 MG TABS (disp 8, 8d supply)    mupirocin ointment (BACTROBAN) 2 % [id number] No Place 1 application. into the nose 2 (two) times daily.  Patient not taking: Reported on 01/17/2022   Max Sane, MD Not Taking Active   nitroGLYCERIN (NITROSTAT) 0.4 MG SL tablet [id number] Yes DISSOLVE 1 TABLET UNDER THE TONGUE EVERY 5 MINUTES AS NEEDED FOR CHEST PAIN [name], MD Taking Active Pharmacy Records, Multiple Informants           Med Note Miki Kins Jan 01, 2022  2:49 PM) Recent Dispenses     11/24/2021 0.4  MG SUBL (disp 25, 10d supply)    ondansetron (ZOFRAN) 4 MG tablet [id number] Yes Take 1 tablet (4 mg total) by mouth every 8 (eight) hours as needed for nausea or vomiting. [name], MD Taking Active Pharmacy Records, Multiple Informants           Med Note Carlye Grippe, Orland Penman Jan 01, 2022  2:49 PM) Recent Dispenses     12/13/2021 4 MG TABS (disp 30, 10d supply)    polyethylene glycol (MIRALAX / GLYCOLAX) 17 g packet [id number] Yes Take 17 g by mouth daily as needed. Hosie Poisson, MD Taking Active Pharmacy Records, Multiple Informants  pramipexole (MIRAPEX) 1 MG tablet [id number] Yes Take 1 tablet (1 mg total) by mouth daily. At Harlon Flor, MD Taking Active Pharmacy Records, Multiple Informants           Med Note Carlye Grippe, Orland Penman Jan 01, 2022  2:48 PM) Recent Dispenses     11/24/2021 1 MG TABS (disp 8, 8d supply)    RABEprazole (ACIPHEX) 20 MG tablet [id number] Yes Take 1 tablet (20 mg total) by mouth daily. [name], MD Taking Active Pharmacy Records, Multiple Informants           Med Note Carlye Grippe, Orland Penman Jan 01, 2022  2:48 PM) Recent Dispenses     11/09/2021 20 MG TBEC (disp 90, 90d supply)    Semaglutide, 1 MG/DOSE, (OZEMPIC, 1 MG/DOSE,) 4 MG/3ML SOPN [id number] Yes Inject 1 mg into the skin once a week. [name], MD Taking Active   sodium chloride (MURO 128) 5 % ophthalmic solution [id number] Yes 1 drop as needed for eye irritation. [provider] Taking Active Pharmacy Records, Multiple Informants           Med Note Carlye Grippe, Orland Penman Jan 01, 2022  2:48 PM) Recent Dispenses     11/24/2021 5 % SOLN (disp 15, 30d supply)    tamsulosin (FLOMAX) 0.4 MG CAPS capsule [id number] Yes Take 1 capsule (0.4 mg total) by mouth daily after breakfast. [name], MD Taking Active Pharmacy Records, Multiple Informants  vitamin C (ASCORBIC ACID) 500 MG tablet [id number] Yes Take 1,000 mg by mouth daily as needed. [provider] Taking Active Pharmacy Records, Multiple Informants           Med Note Miki Kins Jan 01, 2022  2:47 PM) Recent Dispenses     11/24/2021 1000 MG TABS (disp 15, 15d supply)              Patient Active Problem List   Diagnosis Date Noted   Bradycardia    Syncope and collapse 01/01/2022   Insomnia 01/01/2022   Hypoglycemia 01/01/2022   COVID-19 virus infection 01/01/2022   Non-traumatic rhabdomyolysis    Aspiration pneumonia ([place]) 11/23/2021   Hypomagnesemia 11/23/2021   C. difficile colitis    Postural dizziness with presyncope 10/25/2021   Abrasion of heel, infected, left, initial encounter 10/25/2021   Cellulitis of left foot 10/25/2021   General weakness 05/12/2021   Right medial knee pain 05/12/2021   Frequent UTI 03/31/2021   Polypharmacy 03/10/2021   Acute encephalopathy 11/02/2020   Nausea and vomiting 10/18/2020   Healthcare maintenance 06/01/2020   Involuntary movements 04/25/2020   Post-nasal drainage 07/16/2019   Recurrent productive cough 05/31/2019   Hx of basal cell carcinoma 07/10/2017    Rosacea    Gait disturbance, post-stroke 10/05/2015   Presbycusis  of both ears    Advanced care planning/counseling discussion 06/23/2015   Impaired mobility 04/23/2015   Hearing loss of right ear due to cerumen impaction 03/23/2015   HTN (hypertension) 11/73/5670   Diastolic dysfunction 14/06/3012   Spastic hemiparesis affecting dominant side ([place]) 07/07/2014   Chest pressure 02/03/2014   Medicare annual wellness visit, subsequent 11/06/2013   Erectile dysfunction due to arterial insufficiency 02/05/2013   MDD (major depressive disorder), recurrent episode, moderate ([place])    Palpitations 06/26/2012   Hyperlipidemia associated with type 2 diabetes mellitus ([place])    Restless leg syndrome, severe    GERD (gastroesophageal reflux disease)    BPH with obstruction/lower urinary tract symptoms    History of CVA (cerebrovascular accident) 03/11/2012   Hemiparesis affecting right side as late effect of cerebrovascular accident (CVA) ([place]) 03/11/2012   Uncontrolled type 2 diabetes mellitus with hyperglycemia, with long-term current use of insulin ([place]) 03/11/2012   OSA on CPAP 03/11/2012   Chronic kidney disease, stage 3a ([place]) 03/11/2012   Migraines 03/11/2012   Thrombocytopenia ([place]) 2011   Cause of injury, fall 2011    Immunization History  Administered Date(s) Administered   Fluad Quad(high Dose 65+) 06/05/2019, 06/01/2020, 06/13/2021   Influenza Split 06/25/2012   Influenza, High Dose Seasonal PF 06/17/2018   Influenza,inj,Quad PF,6+ Mos 06/02/2013, 06/09/2014, 06/23/2015, 07/17/2016, 07/18/2017   PFIZER(Purple Top)SARS-COV-2 Vaccination 10/12/2019, 11/03/2019, 06/30/2020   Pneumococcal Conjugate-13 11/06/2013   Pneumococcal Polysaccharide-23 09/19/2007, 09/18/2008   Td 07/17/2016   Tdap 05/24/2018   Zoster, Live 09/26/2007    Conditions to be addressed/monitored:  {USCCMDZASSESSMENTOPTIONS:23563}  There are no care plans that you recently modified to display for this  patient.    Medication Assistance: {MEDASSISTANCEINFO:25044}  Compliance/Adherence/Medication fill history: Care Gaps: Foot exam (due 08/31/21) Eye exam (due 11/23/21)  Star-Rating Drugs: ***  Patient's preferred pharmacy is:  [facility], [place] [address] [place] [address] Phone: [phone] Fax: [phone]  [facility] [place], [place] [facility] [place] [address] [name] [address] Phone: [phone] Fax: [phone]  Uses pill box? {Yes or If no, why not?:20788} Pt endorses ***% compliance  We discussed: {Pharmacy options:24294} Patient decided to: {US Pharmacy Plan:23885}  Care Plan and Follow Up Patient Decision:  {FOLLOWUP:24991}  Plan: {CM FOLLOW UP PLAN:25073}  ***    Current Barriers:  Unable to achieve control of restless leg syndrome   Pharmacist Clinical Goal(s):  Over the next 90 days, patient will contact provider office for questions/concerns as evidenced notation of same in electronic health record through collaboration with PharmD and provider.   Interventions: 1:1 collaboration with [name], MD regarding development and update of comprehensive plan of care as evidenced by provider attestation and co-signature Inter-disciplinary care team collaboration (see longitudinal plan of care) Comprehensive medication review performed; medication list updated in electronic medical record  Hypertension (BP goal <140/90) -Controlled - home readings within goal -Current treatment: None -Medications previously tried: Lisinopril - tachycardia, Valsartan on hold   -Current home readings: 120/68 this week with home health nurse -Last UACR < 30, 2014 -Denies hypotensive/hypertensive symptoms -Educated on Importance of home blood pressure monitoring; -Counseled to monitor BP at home daily, document,  and provide log at future appointments -Recommend continue current medications   Hyperlipidemia: (LDL goal < 70) -Query Controlled - LDL 57 (05/2021) - rosuvastatin stopped Feb 2023 hospitalization -Hx CVA -  Current treatment: Clopidogrel 75 mg daily -Medications previously tried: rosuvastatin 40 mg -Educated on Benefits of statin for ASCVD risk reduction; -Recommended to continue current medications  Diabetes (A1c goal <8%  ADA - age/comorbidities) -Controlled - A1c 8.0%  -Followed by endocrinology [name] -Current home glucose readings - using Freestyle Libre -Current medications: Ozempic 1 mg weekly Tresiba 90 units HS Novolog SSI -Medications previously tried: Janumet, glyburide (GI), Farxiga -Adherence - reports taking Humalog sliding scale (5-15 units, usually 15 units) prior to lunch. Confirms 90 units Tresiba and 1 mg Ozempic weekly. -Recommended to continue current medication  RLS (Goal: Improve symptoms) -Controlled - pt reports symptoms controlled -Current treatment  Gabapentin 300 mg - 1 up to four times daily  (pt taking differently: 2 caps four times daily) Cymbalta 60 mg - 1 capsule daily at 9 PM Pramipexole 1 mg - 1 tablet daily at 9 PM Alpha lipoic acid 600 mg - 1 twice daily  Lorazepam - 1 tablet as needed at bedtime  -Medications previously tried: oxycodone - delirium -Pt reports he has not tried lorazepam. Let him know he could try this and reduce gabapentin dose slowly down to $Remov'1200mg'qnzlDV$ /day -Pt reports he cannot see Dr. Jeraldine Loots until Nov 2022.  -Recommended to continue current medication; Trial lorazepam as PCP recommended. Try reducing gabapentin dose slowly to 1200 mg max/day.  BPH -Uncontrolled - pt reports nocturia x 2 nightly -Current treatment Tamsulosin 0.4 mg - 1 capsule daily (not taking) Recent history: Flomax started by hospital early 2022 and never refilled. Pt ran out after 30 days. Saw PCP and discussed tamsulosin not very effective, PCP  recommended evening dosing after supper. Pt never tried this as he did not have any refills. - We discussed: He would like to trial tamsulosin again but take it after supper. -Recommended resume tamsulosin after supper. Sent note to CMA to refill tamsulosin to [facility].   Patient Goals/Self-Care Activities Patient will:  - {pharmacypatientgoals:24919}

## 2022-02-02 NOTE — Telephone Encounter (Signed)
  Chronic Care Management   Outreach Note  02/02/2022 Name: BURTIS IMHOFF, MD MRN: [id number] DOB: [birth year]  Referred by: [name], MD  Patient had a phone appointment scheduled with clinical pharmacist today.  An unsuccessful telephone outreach was attempted today. The patient was referred to the pharmacist for assistance with medications, care management and care coordination.   Patient will NOT be penalized in any way for missing a CCM appointment. The no-show fee does not apply.  If possible, a message was left to return call to: [phone] or to [facility].  Charlene Brooke, PharmD, BCACP Clinical Pharmacist [facility] [phone]

## 2022-02-03 ENCOUNTER — Telehealth: Payer: Self-pay

## 2022-02-03 NOTE — Telephone Encounter (Signed)
Agree with above recommendations. Ok to give verbal for this.

## 2022-02-03 NOTE — Telephone Encounter (Signed)
Lvm for [employer].  Need to inform her Dr. Darnell Level is giving verbal orders for services requested.

## 2022-02-03 NOTE — Telephone Encounter (Signed)
Beverlee Nims with [employer], called to ask for verbal orders for OT and PT for the patient. CB [phone]  Patient was discharged from [facility] services and they will take over-they are contracted with the place he lives at.

## 2022-02-06 DIAGNOSIS — U099 Post covid-19 condition, unspecified: Secondary | ICD-10-CM | POA: Diagnosis not present

## 2022-02-06 DIAGNOSIS — M6281 Muscle weakness (generalized): Secondary | ICD-10-CM | POA: Diagnosis not present

## 2022-02-06 DIAGNOSIS — I69353 Hemiplegia and hemiparesis following cerebral infarction affecting right non-dominant side: Secondary | ICD-10-CM | POA: Diagnosis not present

## 2022-02-06 NOTE — Telephone Encounter (Signed)
Lvm for [employer].  Need to inform her Dr. Darnell Level is giving verbal orders for services requested.

## 2022-02-07 DIAGNOSIS — Z9181 History of falling: Secondary | ICD-10-CM | POA: Diagnosis not present

## 2022-02-07 DIAGNOSIS — R2681 Unsteadiness on feet: Secondary | ICD-10-CM | POA: Diagnosis not present

## 2022-02-07 DIAGNOSIS — R2689 Other abnormalities of gait and mobility: Secondary | ICD-10-CM | POA: Diagnosis not present

## 2022-02-07 NOTE — Telephone Encounter (Signed)
Left message on voicemail to call the office back. This is the third message left.

## 2022-02-08 DIAGNOSIS — I69353 Hemiplegia and hemiparesis following cerebral infarction affecting right non-dominant side: Secondary | ICD-10-CM | POA: Diagnosis not present

## 2022-02-08 DIAGNOSIS — M6281 Muscle weakness (generalized): Secondary | ICD-10-CM | POA: Diagnosis not present

## 2022-02-08 DIAGNOSIS — U099 Post covid-19 condition, unspecified: Secondary | ICD-10-CM | POA: Diagnosis not present

## 2022-02-08 NOTE — Telephone Encounter (Signed)
Lvm for [employer].  Need to inform her Dr. Darnell Level is giving verbal orders for services requested.

## 2022-02-09 NOTE — Telephone Encounter (Signed)
Spoke with James Bell informing her Dr. Darnell Level is giving verbal orders for services requested.  States she tried to call back yesterday and got down to #9 in que but couldn't hold any longer.  She expresses her thanks for me calling back.

## 2022-02-10 DIAGNOSIS — R2681 Unsteadiness on feet: Secondary | ICD-10-CM | POA: Diagnosis not present

## 2022-02-10 DIAGNOSIS — U099 Post covid-19 condition, unspecified: Secondary | ICD-10-CM | POA: Diagnosis not present

## 2022-02-10 DIAGNOSIS — I69353 Hemiplegia and hemiparesis following cerebral infarction affecting right non-dominant side: Secondary | ICD-10-CM | POA: Diagnosis not present

## 2022-02-10 DIAGNOSIS — Z9181 History of falling: Secondary | ICD-10-CM | POA: Diagnosis not present

## 2022-02-10 DIAGNOSIS — R2689 Other abnormalities of gait and mobility: Secondary | ICD-10-CM | POA: Diagnosis not present

## 2022-02-10 DIAGNOSIS — M6281 Muscle weakness (generalized): Secondary | ICD-10-CM | POA: Diagnosis not present

## 2022-02-14 ENCOUNTER — Ambulatory Visit: Payer: Medicare Other | Admitting: Medical

## 2022-02-14 ENCOUNTER — Encounter: Payer: Self-pay | Admitting: Medical

## 2022-02-14 DIAGNOSIS — R2689 Other abnormalities of gait and mobility: Secondary | ICD-10-CM | POA: Diagnosis not present

## 2022-02-14 DIAGNOSIS — M6281 Muscle weakness (generalized): Secondary | ICD-10-CM | POA: Diagnosis not present

## 2022-02-14 DIAGNOSIS — I69353 Hemiplegia and hemiparesis following cerebral infarction affecting right non-dominant side: Secondary | ICD-10-CM | POA: Diagnosis not present

## 2022-02-14 DIAGNOSIS — Z9181 History of falling: Secondary | ICD-10-CM | POA: Diagnosis not present

## 2022-02-14 DIAGNOSIS — U099 Post covid-19 condition, unspecified: Secondary | ICD-10-CM | POA: Diagnosis not present

## 2022-02-14 DIAGNOSIS — R2681 Unsteadiness on feet: Secondary | ICD-10-CM | POA: Diagnosis not present

## 2022-02-14 NOTE — Progress Notes (Deleted)
Cardiology Office Note:    Date:  02/14/2022   ID:  James Spurr, James Bell, DOB Jun 13, 1931, MRN [id number]  PCP:  [name], [facility] Cardiologist:  Kathlyn Sacramento, James Bell  [place] Electrophysiologist:  None   Referring James Bell: [name], James Bell   Chief Complaint: Hospital follow-up  History of Present Illness:    James Spurr, James Bell is a 88 y.o. male with a hx of DM2, CKD, hypothyroidism, prior stroke who presents for hospital follow-up.   The patient was addmitted in April for syncope. In the ED he was hypoglycemic with a BG of 46 that improved with dextrose infusion. He was found to have a low grade fever and was COVID positive. CPK was mildly elevated at 1187. Telemetry was noted to have intermittent bradycardia with some pasues, 2.7 was the longest. Mobitz 2 degree AV block was noted. Echo showed normal LVSF with mildly calcified aortic valve. A 2 week heart monitor was ordered. Last seen 01/17/22 and was overall doing well. Heart monitor showed predominately normal rhythm with an average HR of 75bpm, 13 beats of SVT longest lasting 15 beats, rare PACs and PVCs.   Today,   Past Medical History:  Diagnosis Date   Anemia    Basal cell carcinoma 2018   R temple    Basal cell carcinoma of nose 2002   bridge of nose   BPH with obstruction/lower urinary tract symptoms    failed flomax, uroxatral, myrbetriq - sees urology [name]) pending videourodynamics   CKD (chronic kidney disease) stage 3, GFR 30-59 ml/min ([place]) 03/11/2012   Renal US 02/2014 - multiple kidney cysts    CVA (cerebral infarction) 03/07/2012   slurred speech; right hemplegia, left handed - completed PT 07/2014 (17 sessions)   Dehydration    Depression    mild, on cymbalta per prior PCP   Erectile dysfunction    per urology (prostaglandin injection)   GERD (gastroesophageal reflux disease)    with sliding HH   H/O hiatal hernia    History of kidney problems 12/2010   lacerated left  kidney after fall   HLD (hyperlipidemia)    Hx of basal cell carcinoma 07/10/2017   R. lat. forehead near hair line   Kidney cysts 02/2014   bilateral (renal US)   Migraines 03/11/2012   now rare   OSA on CPAP    sleep study 01/2014 - severe, CPAP at 10, previously on nuvigil (Dr. [name])   Palpitations 01/2012   holter WNL, rare PAC/PVCs   Presbycusis of both ears 2016   rec amplification [facility])   Restless leg syndrome    well controlled on mirapex   Rosacea    [name]   Stroke [facility])    TIA (transient ischemic attack)    Type 2 diabetes, controlled, with neuropathy ([place]) 2011   [name] in [facility]    Past Surgical History:  Procedure Laterality Date   ABI  2007   WNL   CARDIAC CATHETERIZATION  2004   normal; [place] [facility]   CARDIOVASCULAR STRESS TEST  02/2014   WNL, low risk scan, EF 68%   CAROTID ENDARTERECTOMY     CATARACT EXTRACTION W/ INTRAOCULAR LENS IMPLANT  11//2012 - 08/2011   COLONOSCOPY  03/2011   poor prep, unable to biopsy polyp in tic fold, rpt 3 mo [name])   COLONOSCOPY  06/2011   mult polyps adenomatous, severe diverticulosis, rpt 3 yrs [name])   CYSTOSCOPY WITH URETHRAL DILATATION N/A 07/24/2013  Procedure: CYSTOSCOPY WITH URETHRAL DILATATION;  Surgeon: [name], James Bell;  Location: [facility];  Service: Urology;  Laterality: N/A;   ESOPHAGOGASTRODUODENOSCOPY  2006   duodenitis, medual HH, Grade 2 reflux, mild gastritis   ESOPHAGOGASTRODUODENOSCOPY  07/2011   mild chronic gastritis   HEMORRHOID SURGERY  1991   INGUINAL HERNIA REPAIR  1981; 2002   left, then repaired   MRI brain  04/2010   WNL   PROSTATE SURGERY  02/2009   PVP (laser)   SKIN CANCER EXCISION  2002   bridge of nose, [facility]   TONSILLECTOMY AND ADENOIDECTOMY  ~ [place] N/A 07/24/2013   Procedure: TRANSURETHRAL INCISION OF THE PROSTATE (TUIP);  Surgeon: [name], James Bell;  Location: [facility];  Service: Urology;  Laterality: N/A;   URETHRAL DILATION   2014   [facility]    Current Medications: No outpatient medications have been marked as taking for the 02/14/22 encounter (Appointment) with Kathlen Mody, Edel Rivero H, PA-C.     Allergies:   Dulaglutide, Empagliflozin, Lisinopril, Nexium [esomeprazole], Oxycodone, Pregabalin, and Rosuvastatin   Social History   Socioeconomic History   Marital status: Divorced    Spouse name: Not on file   Number of children: 2   Years of education: James Bell   Highest education level: Not on file  Occupational History   Occupation: retired    Fish farm manager: RETIRED  Tobacco Use   Smoking status: Never   Smokeless tobacco: Never   Tobacco comments:    "used to smoke pipe when I was youngerFinancial risk analyst Use: Never used  Substance and Sexual Activity   Alcohol use: Yes    Alcohol/week: 4.0 standard drinks    Types: 2 Glasses of wine, 2 Shots of liquor per week    Comment: occasional   Drug use: No   Sexual activity: Yes  Other Topics Concern   Not on file  Social History Narrative   Moved into [place] in [place] 12/2020   Caffeine: 1 cup coffee/day     Occupation: ophthalmologist, retired   Edu: James Bell   Activity: going to gym 3x/wk with trainer   Diet: some water, fruits/vegetables some, red meat a few times, fish 1x/wk   POA - James Bell (Daughter)   Social Determinants of Health   Financial Resource Strain: Not on file  Food Insecurity: Not on file  Transportation Needs: Not on file  Physical Activity: Not on file  Stress: Not on file  Social Connections: Not on file     Family History: The patient's family history includes Alcoholism in his sister; Diabetes (age of onset: 23) in his brother; Kidney disease in his sister; Lung cancer in his brother; Lung cancer (age of onset: 29) in his father; Stroke in his brother and father. There is no history of Coronary artery disease or Sleep apnea.  ROS:   Please see the history of present illness.     All other systems  reviewed and are negative.  EKGs/Labs/Other Studies Reviewed:    The following studies were reviewed today:  Heart monitor 12/2021 Patch Wear Time:  13 days and 21 hours (2023-04-18T12:53:33-0400 to 2023-05-02T10:42:28-0400)   Patient had a min HR of 44 bpm, max HR of 145 bpm, and avg HR of 75 bpm.  Predominant underlying rhythm was Sinus Rhythm.  13 Supraventricular Tachycardia runs occurred, the run with the fastest interval lasting 5 beats with a max rate of 145 bpm, the longest  lasting 16 beats with an avg rate of 101 bpm. Rare PACs and rare PVCs.  Echo 12/2021  1. Left ventricular ejection fraction, by estimation, is 55 to 60%. The  left ventricle has normal function. The left ventricle has no regional  wall motion abnormalities. Left ventricular diastolic parameters are  indeterminate.   2. Right ventricular systolic function is normal. The right ventricular  size is normal.   3. The mitral valve is normal in structure. No evidence of mitral valve  regurgitation. No evidence of mitral stenosis.   4. The aortic valve is calcified. Aortic valve regurgitation is not  visualized. Aortic valve sclerosis/calcification is present, without any  evidence of aortic stenosis.   5. The inferior vena cava is normal in size with <50% respiratory  variability, suggesting right atrial pressure of 8 mmHg.   EKG:  EKG is *** ordered today.  The ekg ordered today demonstrates ***  Recent Labs: 01/01/2022: B Natriuretic Peptide 180.8; TSH 2.546 01/02/2022: ALT 18; Magnesium 2.7 01/03/2022: BUN 34; Creatinine, Ser 1.41; Hemoglobin 12.8; Platelets 148; Potassium 4.6; Sodium 136  Recent Lipid Panel    Component Value Date/Time   CHOL 105 06/13/2021 1528   TRIG 209.0 (H) 06/13/2021 1528   HDL 18.50 (L) 06/13/2021 1528   CHOLHDL 6 06/13/2021 1528   VLDL 41.8 (H) 06/13/2021 1528   LDLCALC 53 12/05/2019 0000   LDLDIRECT 57.0 06/13/2021 1528     Risk Assessment/Calculations:   {Does this  patient have ATRIAL FIBRILLATION?[phone]}   Physical Exam:    VS:  There were no vitals taken for this visit.    Wt Readings from Last 3 Encounters:  01/17/22 189 lb 6.4 oz (85.9 kg)  01/03/22 191 lb 5.8 oz (86.8 kg)  12/13/21 188 lb 6 oz (85.4 kg)     GEN: *** Well nourished, well developed in no acute distress HEENT: Normal NECK: No JVD; No carotid bruits LYMPHATICS: No lymphadenopathy CARDIAC: ***RRR, no murmurs, rubs, gallops RESPIRATORY:  Clear to auscultation without rales, wheezing or rhonchi  ABDOMEN: Soft, non-tender, non-distended MUSCULOSKELETAL:  No edema; No deformity  SKIN: Warm and dry NEUROLOGIC:  Alert and oriented x 3 PSYCHIATRIC:  Normal affect   ASSESSMENT:    No diagnosis found. PLAN:    In order of problems listed above:  Bradycardia Mobitz Type 2 second degree AV block  H/o CVA  Disposition: Follow up {follow up:15908} with ***   Shared Decision Making/Informed Consent   {Are you ordering a CV Procedure (e.g. stress test, cath, DCCV, TEE, etc)?   Press F2        :[id number]}    Signed, Saphia Vanderford Ninfa Meeker, PA-C  02/14/2022 1:12 PM    [facility]

## 2022-02-16 DIAGNOSIS — Z9181 History of falling: Secondary | ICD-10-CM | POA: Diagnosis not present

## 2022-02-16 DIAGNOSIS — U099 Post covid-19 condition, unspecified: Secondary | ICD-10-CM | POA: Diagnosis not present

## 2022-02-16 DIAGNOSIS — M6281 Muscle weakness (generalized): Secondary | ICD-10-CM | POA: Diagnosis not present

## 2022-02-16 DIAGNOSIS — I69353 Hemiplegia and hemiparesis following cerebral infarction affecting right non-dominant side: Secondary | ICD-10-CM | POA: Diagnosis not present

## 2022-02-16 DIAGNOSIS — R2681 Unsteadiness on feet: Secondary | ICD-10-CM | POA: Diagnosis not present

## 2022-02-16 DIAGNOSIS — R2689 Other abnormalities of gait and mobility: Secondary | ICD-10-CM | POA: Diagnosis not present

## 2022-02-17 DIAGNOSIS — R2681 Unsteadiness on feet: Secondary | ICD-10-CM | POA: Diagnosis not present

## 2022-02-17 DIAGNOSIS — R2689 Other abnormalities of gait and mobility: Secondary | ICD-10-CM | POA: Diagnosis not present

## 2022-02-17 DIAGNOSIS — Z9181 History of falling: Secondary | ICD-10-CM | POA: Diagnosis not present

## 2022-02-18 DIAGNOSIS — R1310 Dysphagia, unspecified: Secondary | ICD-10-CM | POA: Diagnosis not present

## 2022-02-18 DIAGNOSIS — F802 Mixed receptive-expressive language disorder: Secondary | ICD-10-CM | POA: Diagnosis not present

## 2022-02-18 DIAGNOSIS — R1312 Dysphagia, oropharyngeal phase: Secondary | ICD-10-CM | POA: Diagnosis not present

## 2022-02-18 DIAGNOSIS — R479 Unspecified speech disturbances: Secondary | ICD-10-CM | POA: Diagnosis not present

## 2022-02-20 DIAGNOSIS — M6281 Muscle weakness (generalized): Secondary | ICD-10-CM | POA: Diagnosis not present

## 2022-02-20 DIAGNOSIS — I69353 Hemiplegia and hemiparesis following cerebral infarction affecting right non-dominant side: Secondary | ICD-10-CM | POA: Diagnosis not present

## 2022-02-20 DIAGNOSIS — U099 Post covid-19 condition, unspecified: Secondary | ICD-10-CM | POA: Diagnosis not present

## 2022-02-21 DIAGNOSIS — R2681 Unsteadiness on feet: Secondary | ICD-10-CM | POA: Diagnosis not present

## 2022-02-21 DIAGNOSIS — R2689 Other abnormalities of gait and mobility: Secondary | ICD-10-CM | POA: Diagnosis not present

## 2022-02-21 DIAGNOSIS — Z9181 History of falling: Secondary | ICD-10-CM | POA: Diagnosis not present

## 2022-02-22 DIAGNOSIS — E1165 Type 2 diabetes mellitus with hyperglycemia: Secondary | ICD-10-CM | POA: Diagnosis not present

## 2022-02-22 DIAGNOSIS — U099 Post covid-19 condition, unspecified: Secondary | ICD-10-CM | POA: Diagnosis not present

## 2022-02-22 DIAGNOSIS — M6281 Muscle weakness (generalized): Secondary | ICD-10-CM | POA: Diagnosis not present

## 2022-02-22 DIAGNOSIS — I69353 Hemiplegia and hemiparesis following cerebral infarction affecting right non-dominant side: Secondary | ICD-10-CM | POA: Diagnosis not present

## 2022-02-23 DIAGNOSIS — F802 Mixed receptive-expressive language disorder: Secondary | ICD-10-CM | POA: Diagnosis not present

## 2022-02-23 DIAGNOSIS — R2681 Unsteadiness on feet: Secondary | ICD-10-CM | POA: Diagnosis not present

## 2022-02-23 DIAGNOSIS — Z9181 History of falling: Secondary | ICD-10-CM | POA: Diagnosis not present

## 2022-02-23 DIAGNOSIS — R479 Unspecified speech disturbances: Secondary | ICD-10-CM | POA: Diagnosis not present

## 2022-02-23 DIAGNOSIS — R1310 Dysphagia, unspecified: Secondary | ICD-10-CM | POA: Diagnosis not present

## 2022-02-23 DIAGNOSIS — R2689 Other abnormalities of gait and mobility: Secondary | ICD-10-CM | POA: Diagnosis not present

## 2022-02-23 DIAGNOSIS — R1312 Dysphagia, oropharyngeal phase: Secondary | ICD-10-CM | POA: Diagnosis not present

## 2022-02-24 DIAGNOSIS — Z9181 History of falling: Secondary | ICD-10-CM | POA: Diagnosis not present

## 2022-02-24 DIAGNOSIS — R2681 Unsteadiness on feet: Secondary | ICD-10-CM | POA: Diagnosis not present

## 2022-02-24 DIAGNOSIS — R2689 Other abnormalities of gait and mobility: Secondary | ICD-10-CM | POA: Diagnosis not present

## 2022-02-27 DIAGNOSIS — U099 Post covid-19 condition, unspecified: Secondary | ICD-10-CM | POA: Diagnosis not present

## 2022-02-27 DIAGNOSIS — M6281 Muscle weakness (generalized): Secondary | ICD-10-CM | POA: Diagnosis not present

## 2022-02-27 DIAGNOSIS — I69353 Hemiplegia and hemiparesis following cerebral infarction affecting right non-dominant side: Secondary | ICD-10-CM | POA: Diagnosis not present

## 2022-02-28 DIAGNOSIS — R2681 Unsteadiness on feet: Secondary | ICD-10-CM | POA: Diagnosis not present

## 2022-02-28 DIAGNOSIS — Z9181 History of falling: Secondary | ICD-10-CM | POA: Diagnosis not present

## 2022-02-28 DIAGNOSIS — R2689 Other abnormalities of gait and mobility: Secondary | ICD-10-CM | POA: Diagnosis not present

## 2022-03-01 DIAGNOSIS — U099 Post covid-19 condition, unspecified: Secondary | ICD-10-CM | POA: Diagnosis not present

## 2022-03-01 DIAGNOSIS — M6281 Muscle weakness (generalized): Secondary | ICD-10-CM | POA: Diagnosis not present

## 2022-03-01 DIAGNOSIS — I69353 Hemiplegia and hemiparesis following cerebral infarction affecting right non-dominant side: Secondary | ICD-10-CM | POA: Diagnosis not present

## 2022-03-02 DIAGNOSIS — R2681 Unsteadiness on feet: Secondary | ICD-10-CM | POA: Diagnosis not present

## 2022-03-02 DIAGNOSIS — R1312 Dysphagia, oropharyngeal phase: Secondary | ICD-10-CM | POA: Diagnosis not present

## 2022-03-02 DIAGNOSIS — Z9181 History of falling: Secondary | ICD-10-CM | POA: Diagnosis not present

## 2022-03-02 DIAGNOSIS — F802 Mixed receptive-expressive language disorder: Secondary | ICD-10-CM | POA: Diagnosis not present

## 2022-03-02 DIAGNOSIS — R479 Unspecified speech disturbances: Secondary | ICD-10-CM | POA: Diagnosis not present

## 2022-03-02 DIAGNOSIS — R1310 Dysphagia, unspecified: Secondary | ICD-10-CM | POA: Diagnosis not present

## 2022-03-02 DIAGNOSIS — R2689 Other abnormalities of gait and mobility: Secondary | ICD-10-CM | POA: Diagnosis not present

## 2022-03-03 DIAGNOSIS — R2681 Unsteadiness on feet: Secondary | ICD-10-CM | POA: Diagnosis not present

## 2022-03-03 DIAGNOSIS — Z9181 History of falling: Secondary | ICD-10-CM | POA: Diagnosis not present

## 2022-03-03 DIAGNOSIS — R2689 Other abnormalities of gait and mobility: Secondary | ICD-10-CM | POA: Diagnosis not present

## 2022-03-06 DIAGNOSIS — I69353 Hemiplegia and hemiparesis following cerebral infarction affecting right non-dominant side: Secondary | ICD-10-CM | POA: Diagnosis not present

## 2022-03-06 DIAGNOSIS — U099 Post covid-19 condition, unspecified: Secondary | ICD-10-CM | POA: Diagnosis not present

## 2022-03-06 DIAGNOSIS — M6281 Muscle weakness (generalized): Secondary | ICD-10-CM | POA: Diagnosis not present

## 2022-03-07 DIAGNOSIS — I6930 Unspecified sequelae of cerebral infarction: Secondary | ICD-10-CM | POA: Diagnosis not present

## 2022-03-07 DIAGNOSIS — Z794 Long term (current) use of insulin: Secondary | ICD-10-CM | POA: Diagnosis not present

## 2022-03-07 DIAGNOSIS — E1142 Type 2 diabetes mellitus with diabetic polyneuropathy: Secondary | ICD-10-CM | POA: Diagnosis not present

## 2022-03-07 DIAGNOSIS — E785 Hyperlipidemia, unspecified: Secondary | ICD-10-CM | POA: Diagnosis not present

## 2022-03-07 DIAGNOSIS — G4733 Obstructive sleep apnea (adult) (pediatric): Secondary | ICD-10-CM | POA: Diagnosis not present

## 2022-03-07 DIAGNOSIS — R2681 Unsteadiness on feet: Secondary | ICD-10-CM | POA: Diagnosis not present

## 2022-03-07 DIAGNOSIS — R2689 Other abnormalities of gait and mobility: Secondary | ICD-10-CM | POA: Diagnosis not present

## 2022-03-07 DIAGNOSIS — Z9181 History of falling: Secondary | ICD-10-CM | POA: Diagnosis not present

## 2022-03-07 DIAGNOSIS — E039 Hypothyroidism, unspecified: Secondary | ICD-10-CM | POA: Diagnosis not present

## 2022-03-08 ENCOUNTER — Other Ambulatory Visit: Payer: Self-pay | Admitting: Family Medicine

## 2022-03-08 DIAGNOSIS — Z9181 History of falling: Secondary | ICD-10-CM | POA: Diagnosis not present

## 2022-03-08 DIAGNOSIS — R2681 Unsteadiness on feet: Secondary | ICD-10-CM | POA: Diagnosis not present

## 2022-03-08 DIAGNOSIS — R2689 Other abnormalities of gait and mobility: Secondary | ICD-10-CM | POA: Diagnosis not present

## 2022-03-09 DIAGNOSIS — R2681 Unsteadiness on feet: Secondary | ICD-10-CM | POA: Diagnosis not present

## 2022-03-09 DIAGNOSIS — R2689 Other abnormalities of gait and mobility: Secondary | ICD-10-CM | POA: Diagnosis not present

## 2022-03-09 DIAGNOSIS — Z9181 History of falling: Secondary | ICD-10-CM | POA: Diagnosis not present

## 2022-03-10 DIAGNOSIS — I69353 Hemiplegia and hemiparesis following cerebral infarction affecting right non-dominant side: Secondary | ICD-10-CM | POA: Diagnosis not present

## 2022-03-10 DIAGNOSIS — U099 Post covid-19 condition, unspecified: Secondary | ICD-10-CM | POA: Diagnosis not present

## 2022-03-10 DIAGNOSIS — M6281 Muscle weakness (generalized): Secondary | ICD-10-CM | POA: Diagnosis not present

## 2022-03-13 DIAGNOSIS — U099 Post covid-19 condition, unspecified: Secondary | ICD-10-CM | POA: Diagnosis not present

## 2022-03-13 DIAGNOSIS — M6281 Muscle weakness (generalized): Secondary | ICD-10-CM | POA: Diagnosis not present

## 2022-03-13 DIAGNOSIS — I69353 Hemiplegia and hemiparesis following cerebral infarction affecting right non-dominant side: Secondary | ICD-10-CM | POA: Diagnosis not present

## 2022-03-14 DIAGNOSIS — R2681 Unsteadiness on feet: Secondary | ICD-10-CM | POA: Diagnosis not present

## 2022-03-14 DIAGNOSIS — R2689 Other abnormalities of gait and mobility: Secondary | ICD-10-CM | POA: Diagnosis not present

## 2022-03-14 DIAGNOSIS — Z9181 History of falling: Secondary | ICD-10-CM | POA: Diagnosis not present

## 2022-03-15 DIAGNOSIS — M6281 Muscle weakness (generalized): Secondary | ICD-10-CM | POA: Diagnosis not present

## 2022-03-15 DIAGNOSIS — Z9181 History of falling: Secondary | ICD-10-CM | POA: Diagnosis not present

## 2022-03-15 DIAGNOSIS — U099 Post covid-19 condition, unspecified: Secondary | ICD-10-CM | POA: Diagnosis not present

## 2022-03-15 DIAGNOSIS — R479 Unspecified speech disturbances: Secondary | ICD-10-CM | POA: Diagnosis not present

## 2022-03-15 DIAGNOSIS — R1312 Dysphagia, oropharyngeal phase: Secondary | ICD-10-CM | POA: Diagnosis not present

## 2022-03-15 DIAGNOSIS — R1310 Dysphagia, unspecified: Secondary | ICD-10-CM | POA: Diagnosis not present

## 2022-03-15 DIAGNOSIS — R2689 Other abnormalities of gait and mobility: Secondary | ICD-10-CM | POA: Diagnosis not present

## 2022-03-15 DIAGNOSIS — R2681 Unsteadiness on feet: Secondary | ICD-10-CM | POA: Diagnosis not present

## 2022-03-15 DIAGNOSIS — I69353 Hemiplegia and hemiparesis following cerebral infarction affecting right non-dominant side: Secondary | ICD-10-CM | POA: Diagnosis not present

## 2022-03-15 DIAGNOSIS — F802 Mixed receptive-expressive language disorder: Secondary | ICD-10-CM | POA: Diagnosis not present

## 2022-03-17 DIAGNOSIS — M6281 Muscle weakness (generalized): Secondary | ICD-10-CM | POA: Diagnosis not present

## 2022-03-17 DIAGNOSIS — U099 Post covid-19 condition, unspecified: Secondary | ICD-10-CM | POA: Diagnosis not present

## 2022-03-17 DIAGNOSIS — I69353 Hemiplegia and hemiparesis following cerebral infarction affecting right non-dominant side: Secondary | ICD-10-CM | POA: Diagnosis not present

## 2022-03-20 DIAGNOSIS — I69353 Hemiplegia and hemiparesis following cerebral infarction affecting right non-dominant side: Secondary | ICD-10-CM | POA: Diagnosis not present

## 2022-03-20 DIAGNOSIS — U099 Post covid-19 condition, unspecified: Secondary | ICD-10-CM | POA: Diagnosis not present

## 2022-03-20 DIAGNOSIS — M6281 Muscle weakness (generalized): Secondary | ICD-10-CM | POA: Diagnosis not present

## 2022-03-22 DIAGNOSIS — I69353 Hemiplegia and hemiparesis following cerebral infarction affecting right non-dominant side: Secondary | ICD-10-CM | POA: Diagnosis not present

## 2022-03-22 DIAGNOSIS — U099 Post covid-19 condition, unspecified: Secondary | ICD-10-CM | POA: Diagnosis not present

## 2022-03-22 DIAGNOSIS — M6281 Muscle weakness (generalized): Secondary | ICD-10-CM | POA: Diagnosis not present

## 2022-03-24 DIAGNOSIS — Z9181 History of falling: Secondary | ICD-10-CM | POA: Diagnosis not present

## 2022-03-24 DIAGNOSIS — I69353 Hemiplegia and hemiparesis following cerebral infarction affecting right non-dominant side: Secondary | ICD-10-CM | POA: Diagnosis not present

## 2022-03-24 DIAGNOSIS — M6281 Muscle weakness (generalized): Secondary | ICD-10-CM | POA: Diagnosis not present

## 2022-03-24 DIAGNOSIS — R2681 Unsteadiness on feet: Secondary | ICD-10-CM | POA: Diagnosis not present

## 2022-03-24 DIAGNOSIS — R2689 Other abnormalities of gait and mobility: Secondary | ICD-10-CM | POA: Diagnosis not present

## 2022-03-24 DIAGNOSIS — U099 Post covid-19 condition, unspecified: Secondary | ICD-10-CM | POA: Diagnosis not present

## 2022-03-24 DIAGNOSIS — E1165 Type 2 diabetes mellitus with hyperglycemia: Secondary | ICD-10-CM | POA: Diagnosis not present

## 2022-03-25 DIAGNOSIS — R479 Unspecified speech disturbances: Secondary | ICD-10-CM | POA: Diagnosis not present

## 2022-03-25 DIAGNOSIS — F802 Mixed receptive-expressive language disorder: Secondary | ICD-10-CM | POA: Diagnosis not present

## 2022-03-25 DIAGNOSIS — R1312 Dysphagia, oropharyngeal phase: Secondary | ICD-10-CM | POA: Diagnosis not present

## 2022-03-25 DIAGNOSIS — R1310 Dysphagia, unspecified: Secondary | ICD-10-CM | POA: Diagnosis not present

## 2022-03-27 DIAGNOSIS — I69353 Hemiplegia and hemiparesis following cerebral infarction affecting right non-dominant side: Secondary | ICD-10-CM | POA: Diagnosis not present

## 2022-03-27 DIAGNOSIS — U099 Post covid-19 condition, unspecified: Secondary | ICD-10-CM | POA: Diagnosis not present

## 2022-03-27 DIAGNOSIS — M6281 Muscle weakness (generalized): Secondary | ICD-10-CM | POA: Diagnosis not present

## 2022-03-28 DIAGNOSIS — E1140 Type 2 diabetes mellitus with diabetic neuropathy, unspecified: Secondary | ICD-10-CM | POA: Diagnosis not present

## 2022-03-28 DIAGNOSIS — Z9181 History of falling: Secondary | ICD-10-CM | POA: Diagnosis not present

## 2022-03-28 DIAGNOSIS — R2681 Unsteadiness on feet: Secondary | ICD-10-CM | POA: Diagnosis not present

## 2022-03-28 DIAGNOSIS — R2689 Other abnormalities of gait and mobility: Secondary | ICD-10-CM | POA: Diagnosis not present

## 2022-03-28 DIAGNOSIS — G2581 Restless legs syndrome: Secondary | ICD-10-CM | POA: Diagnosis not present

## 2022-03-28 DIAGNOSIS — G8110 Spastic hemiplegia affecting unspecified side: Secondary | ICD-10-CM | POA: Diagnosis not present

## 2022-03-28 DIAGNOSIS — I69351 Hemiplegia and hemiparesis following cerebral infarction affecting right dominant side: Secondary | ICD-10-CM | POA: Diagnosis not present

## 2022-03-29 DIAGNOSIS — I69353 Hemiplegia and hemiparesis following cerebral infarction affecting right non-dominant side: Secondary | ICD-10-CM | POA: Diagnosis not present

## 2022-03-29 DIAGNOSIS — Z9181 History of falling: Secondary | ICD-10-CM | POA: Diagnosis not present

## 2022-03-29 DIAGNOSIS — U099 Post covid-19 condition, unspecified: Secondary | ICD-10-CM | POA: Diagnosis not present

## 2022-03-29 DIAGNOSIS — R2689 Other abnormalities of gait and mobility: Secondary | ICD-10-CM | POA: Diagnosis not present

## 2022-03-29 DIAGNOSIS — M6281 Muscle weakness (generalized): Secondary | ICD-10-CM | POA: Diagnosis not present

## 2022-03-29 DIAGNOSIS — R2681 Unsteadiness on feet: Secondary | ICD-10-CM | POA: Diagnosis not present

## 2022-03-30 DIAGNOSIS — Z9181 History of falling: Secondary | ICD-10-CM | POA: Diagnosis not present

## 2022-03-30 DIAGNOSIS — R1312 Dysphagia, oropharyngeal phase: Secondary | ICD-10-CM | POA: Diagnosis not present

## 2022-03-30 DIAGNOSIS — R479 Unspecified speech disturbances: Secondary | ICD-10-CM | POA: Diagnosis not present

## 2022-03-30 DIAGNOSIS — R1310 Dysphagia, unspecified: Secondary | ICD-10-CM | POA: Diagnosis not present

## 2022-03-30 DIAGNOSIS — R2689 Other abnormalities of gait and mobility: Secondary | ICD-10-CM | POA: Diagnosis not present

## 2022-03-30 DIAGNOSIS — F802 Mixed receptive-expressive language disorder: Secondary | ICD-10-CM | POA: Diagnosis not present

## 2022-03-30 DIAGNOSIS — R2681 Unsteadiness on feet: Secondary | ICD-10-CM | POA: Diagnosis not present

## 2022-03-31 DIAGNOSIS — I69353 Hemiplegia and hemiparesis following cerebral infarction affecting right non-dominant side: Secondary | ICD-10-CM | POA: Diagnosis not present

## 2022-03-31 DIAGNOSIS — M6281 Muscle weakness (generalized): Secondary | ICD-10-CM | POA: Diagnosis not present

## 2022-03-31 DIAGNOSIS — U099 Post covid-19 condition, unspecified: Secondary | ICD-10-CM | POA: Diagnosis not present

## 2022-04-03 DIAGNOSIS — M6281 Muscle weakness (generalized): Secondary | ICD-10-CM | POA: Diagnosis not present

## 2022-04-03 DIAGNOSIS — U099 Post covid-19 condition, unspecified: Secondary | ICD-10-CM | POA: Diagnosis not present

## 2022-04-03 DIAGNOSIS — I69353 Hemiplegia and hemiparesis following cerebral infarction affecting right non-dominant side: Secondary | ICD-10-CM | POA: Diagnosis not present

## 2022-04-05 DIAGNOSIS — M6281 Muscle weakness (generalized): Secondary | ICD-10-CM | POA: Diagnosis not present

## 2022-04-05 DIAGNOSIS — U099 Post covid-19 condition, unspecified: Secondary | ICD-10-CM | POA: Diagnosis not present

## 2022-04-05 DIAGNOSIS — I69353 Hemiplegia and hemiparesis following cerebral infarction affecting right non-dominant side: Secondary | ICD-10-CM | POA: Diagnosis not present

## 2022-04-06 DIAGNOSIS — Z9181 History of falling: Secondary | ICD-10-CM | POA: Diagnosis not present

## 2022-04-06 DIAGNOSIS — R2681 Unsteadiness on feet: Secondary | ICD-10-CM | POA: Diagnosis not present

## 2022-04-06 DIAGNOSIS — R2689 Other abnormalities of gait and mobility: Secondary | ICD-10-CM | POA: Diagnosis not present

## 2022-04-07 DIAGNOSIS — L821 Other seborrheic keratosis: Secondary | ICD-10-CM | POA: Diagnosis not present

## 2022-04-07 DIAGNOSIS — D2262 Melanocytic nevi of left upper limb, including shoulder: Secondary | ICD-10-CM | POA: Diagnosis not present

## 2022-04-07 DIAGNOSIS — D2261 Melanocytic nevi of right upper limb, including shoulder: Secondary | ICD-10-CM | POA: Diagnosis not present

## 2022-04-07 DIAGNOSIS — Z9181 History of falling: Secondary | ICD-10-CM | POA: Diagnosis not present

## 2022-04-07 DIAGNOSIS — Z08 Encounter for follow-up examination after completed treatment for malignant neoplasm: Secondary | ICD-10-CM | POA: Diagnosis not present

## 2022-04-07 DIAGNOSIS — Z85828 Personal history of other malignant neoplasm of skin: Secondary | ICD-10-CM | POA: Diagnosis not present

## 2022-04-07 DIAGNOSIS — R2681 Unsteadiness on feet: Secondary | ICD-10-CM | POA: Diagnosis not present

## 2022-04-07 DIAGNOSIS — R2689 Other abnormalities of gait and mobility: Secondary | ICD-10-CM | POA: Diagnosis not present

## 2022-04-10 DIAGNOSIS — N3001 Acute cystitis with hematuria: Secondary | ICD-10-CM | POA: Diagnosis not present

## 2022-04-10 DIAGNOSIS — Z794 Long term (current) use of insulin: Secondary | ICD-10-CM | POA: Diagnosis not present

## 2022-04-10 DIAGNOSIS — R300 Dysuria: Secondary | ICD-10-CM | POA: Diagnosis not present

## 2022-04-10 DIAGNOSIS — N481 Balanitis: Secondary | ICD-10-CM | POA: Diagnosis not present

## 2022-04-10 DIAGNOSIS — E1169 Type 2 diabetes mellitus with other specified complication: Secondary | ICD-10-CM | POA: Diagnosis not present

## 2022-04-11 DIAGNOSIS — Z9181 History of falling: Secondary | ICD-10-CM | POA: Diagnosis not present

## 2022-04-11 DIAGNOSIS — R2689 Other abnormalities of gait and mobility: Secondary | ICD-10-CM | POA: Diagnosis not present

## 2022-04-11 DIAGNOSIS — R2681 Unsteadiness on feet: Secondary | ICD-10-CM | POA: Diagnosis not present

## 2022-04-12 DIAGNOSIS — U099 Post covid-19 condition, unspecified: Secondary | ICD-10-CM | POA: Diagnosis not present

## 2022-04-12 DIAGNOSIS — I69353 Hemiplegia and hemiparesis following cerebral infarction affecting right non-dominant side: Secondary | ICD-10-CM | POA: Diagnosis not present

## 2022-04-12 DIAGNOSIS — M6281 Muscle weakness (generalized): Secondary | ICD-10-CM | POA: Diagnosis not present

## 2022-04-13 DIAGNOSIS — Z9181 History of falling: Secondary | ICD-10-CM | POA: Diagnosis not present

## 2022-04-13 DIAGNOSIS — R2681 Unsteadiness on feet: Secondary | ICD-10-CM | POA: Diagnosis not present

## 2022-04-13 DIAGNOSIS — R2689 Other abnormalities of gait and mobility: Secondary | ICD-10-CM | POA: Diagnosis not present

## 2022-04-14 DIAGNOSIS — Z9181 History of falling: Secondary | ICD-10-CM | POA: Diagnosis not present

## 2022-04-14 DIAGNOSIS — M6281 Muscle weakness (generalized): Secondary | ICD-10-CM | POA: Diagnosis not present

## 2022-04-14 DIAGNOSIS — I69353 Hemiplegia and hemiparesis following cerebral infarction affecting right non-dominant side: Secondary | ICD-10-CM | POA: Diagnosis not present

## 2022-04-14 DIAGNOSIS — U099 Post covid-19 condition, unspecified: Secondary | ICD-10-CM | POA: Diagnosis not present

## 2022-04-14 DIAGNOSIS — R2681 Unsteadiness on feet: Secondary | ICD-10-CM | POA: Diagnosis not present

## 2022-04-14 DIAGNOSIS — R2689 Other abnormalities of gait and mobility: Secondary | ICD-10-CM | POA: Diagnosis not present

## 2022-04-17 DIAGNOSIS — R2681 Unsteadiness on feet: Secondary | ICD-10-CM | POA: Diagnosis not present

## 2022-04-17 DIAGNOSIS — Z9181 History of falling: Secondary | ICD-10-CM | POA: Diagnosis not present

## 2022-04-17 DIAGNOSIS — R2689 Other abnormalities of gait and mobility: Secondary | ICD-10-CM | POA: Diagnosis not present

## 2022-04-18 DIAGNOSIS — Z9181 History of falling: Secondary | ICD-10-CM | POA: Diagnosis not present

## 2022-04-18 DIAGNOSIS — R2689 Other abnormalities of gait and mobility: Secondary | ICD-10-CM | POA: Diagnosis not present

## 2022-04-18 DIAGNOSIS — R2681 Unsteadiness on feet: Secondary | ICD-10-CM | POA: Diagnosis not present

## 2022-04-19 DIAGNOSIS — R479 Unspecified speech disturbances: Secondary | ICD-10-CM | POA: Diagnosis not present

## 2022-04-19 DIAGNOSIS — U099 Post covid-19 condition, unspecified: Secondary | ICD-10-CM | POA: Diagnosis not present

## 2022-04-19 DIAGNOSIS — F802 Mixed receptive-expressive language disorder: Secondary | ICD-10-CM | POA: Diagnosis not present

## 2022-04-19 DIAGNOSIS — E1142 Type 2 diabetes mellitus with diabetic polyneuropathy: Secondary | ICD-10-CM | POA: Diagnosis not present

## 2022-04-19 DIAGNOSIS — R1310 Dysphagia, unspecified: Secondary | ICD-10-CM | POA: Diagnosis not present

## 2022-04-19 DIAGNOSIS — I69353 Hemiplegia and hemiparesis following cerebral infarction affecting right non-dominant side: Secondary | ICD-10-CM | POA: Diagnosis not present

## 2022-04-19 DIAGNOSIS — L97521 Non-pressure chronic ulcer of other part of left foot limited to breakdown of skin: Secondary | ICD-10-CM | POA: Diagnosis not present

## 2022-04-19 DIAGNOSIS — R1312 Dysphagia, oropharyngeal phase: Secondary | ICD-10-CM | POA: Diagnosis not present

## 2022-04-19 DIAGNOSIS — G8190 Hemiplegia, unspecified affecting unspecified side: Secondary | ICD-10-CM | POA: Diagnosis not present

## 2022-04-19 DIAGNOSIS — M6281 Muscle weakness (generalized): Secondary | ICD-10-CM | POA: Diagnosis not present

## 2022-04-21 DIAGNOSIS — I69353 Hemiplegia and hemiparesis following cerebral infarction affecting right non-dominant side: Secondary | ICD-10-CM | POA: Diagnosis not present

## 2022-04-21 DIAGNOSIS — U099 Post covid-19 condition, unspecified: Secondary | ICD-10-CM | POA: Diagnosis not present

## 2022-04-21 DIAGNOSIS — M6281 Muscle weakness (generalized): Secondary | ICD-10-CM | POA: Diagnosis not present

## 2022-04-23 DIAGNOSIS — E1165 Type 2 diabetes mellitus with hyperglycemia: Secondary | ICD-10-CM | POA: Diagnosis not present

## 2022-04-24 ENCOUNTER — Encounter: Payer: Self-pay | Admitting: Family Medicine

## 2022-04-24 ENCOUNTER — Ambulatory Visit (INDEPENDENT_AMBULATORY_CARE_PROVIDER_SITE_OTHER): Payer: Medicare Other | Admitting: Family Medicine

## 2022-04-24 VITALS — BP 126/68 | HR 68 | Temp 97.3°F | Ht 68.0 in | Wt 189.1 lb

## 2022-04-24 DIAGNOSIS — Z794 Long term (current) use of insulin: Secondary | ICD-10-CM | POA: Diagnosis not present

## 2022-04-24 DIAGNOSIS — E11621 Type 2 diabetes mellitus with foot ulcer: Secondary | ICD-10-CM | POA: Diagnosis not present

## 2022-04-24 DIAGNOSIS — N390 Urinary tract infection, site not specified: Secondary | ICD-10-CM

## 2022-04-24 DIAGNOSIS — L97521 Non-pressure chronic ulcer of other part of left foot limited to breakdown of skin: Secondary | ICD-10-CM

## 2022-04-24 DIAGNOSIS — E1165 Type 2 diabetes mellitus with hyperglycemia: Secondary | ICD-10-CM | POA: Diagnosis not present

## 2022-04-24 DIAGNOSIS — L97501 Non-pressure chronic ulcer of other part of unspecified foot limited to breakdown of skin: Secondary | ICD-10-CM | POA: Insufficient documentation

## 2022-04-24 LAB — POC URINALSYSI DIPSTICK (AUTOMATED)
Bilirubin, UA: NEGATIVE
Blood, UA: NEGATIVE
Glucose, UA: NEGATIVE
Ketones, UA: NEGATIVE
Leukocytes, UA: NEGATIVE
Nitrite, UA: NEGATIVE
Protein, UA: POSITIVE — AB
Spec Grav, UA: 1.015 (ref 1.010–1.025)
Urobilinogen, UA: 0.2 E.U./dL
pH, UA: 5.5 (ref 5.0–8.0)

## 2022-04-24 NOTE — Progress Notes (Signed)
Patient ID: James Spurr, MD, male    DOB: 11/01/1930, 88 y.o.   MRN: [id number]  This visit was conducted in person.  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 Ripped L great toenail off about 2 wks ago.  Seen at UC, tx abx. Also, c/o fatigue and feeling sleepy all the time. Pt accompanied by caregiver, Davy Pique. )   DOI: 3 wks ago Ripped L great toenail off, seen at [facility], placed on bactrim 10d antibiotic course. No redness or discharge. He's been doing dressing changes with betadine. No streaking redness, pain. Chronic neuropathy, toe stays with dull ache.   Also treated for UTI and penile yeast infection at that time as well.  UCx grew >100k Enterobacter colacae sensitive to bactrim  Stays tired/fatigued, sleepiness. General "malaise". Feels he's sleeping well at night however.  Denies UTI symptoms at this time - of dysuria hematuria, urgency, frequency, flank pain or lower abd pain, fever. Notes intermittent nausea and decreased appetite.  COVID infection s/p hospitalization 12/2021 - last seen virtually at that time for hospital follow up visit, was largely asymptomatic with COVID infection. Not consistent with post-COVID fatigue.   Sugars overall doing well - averaging 146.      Relevant past medical, surgical, family and social history reviewed and updated as indicated. Interim medical history since our last visit reviewed. Allergies and medications reviewed and updated. Outpatient Medications Prior to Visit  Medication Sig Dispense Refill   acetaminophen (TYLENOL) 325 MG tablet Take 2 tablets (650 mg total) by mouth every 6 (six) hours as needed for mild pain (or Fever >/= 101).     Alpha-Lipoic Acid 600 MG CAPS Take 1  capsule (600 mg total) by mouth daily. 30 capsule 11   brimonidine-timolol (COMBIGAN) 0.2-0.5 % ophthalmic solution Place 1 drop into both eyes every 12 (twelve) hours.     clopidogrel (PLAVIX) 75 MG tablet TAKE 1 TABLET DAILY 90 tablet 1   Coenzyme Q10 (COQ10) 150 MG CAPS Take 1 capsule by mouth daily.     DULoxetine (CYMBALTA) 60 MG capsule Take 1 capsule (60 mg total) by mouth daily. 90 capsule 3   famotidine (PEPCID) 20 MG tablet Take 1 tablet (20 mg total) by mouth at bedtime.     fluconazole (DIFLUCAN) 150 MG tablet Take 1 tablet (150 mg total) by mouth once a week. 2 tablet 0   fluticasone (FLOVENT HFA) 110 MCG/ACT inhaler Inhale 1 puff into the lungs in the morning and at bedtime. 1 each 0   gabapentin (NEURONTIN) 300 MG capsule Take 2 capsules (600 mg total) by mouth 3 (three) times daily. 600 mg BID is his minimum dose without having breakthrough symptoms. He is aware 900 mg/day is recommended per kidney function.     insulin aspart (NOVOLOG) 100 UNIT/ML injection CBG 70 - 120: 0 units  CBG 121 - 150: 1 unit  CBG 151 - 200: 2 units  CBG 201 - 250: 3 units  CBG 251 - 300: 5 units  CBG 301 - 350: 7 units  CBG 351 - 400: 9 units 10 mL 11  insulin degludec (TRESIBA FLEXTOUCH) 200 UNIT/ML FlexTouch Pen Inject 90 Units into the skin at bedtime.     Insulin Pen Needle (ULTICARE MICRO PEN NEEDLES) 31G X 8 MM MISC Use to inject medication daily 100 each 3   levothyroxine (SYNTHROID) 50 MCG tablet Take 1 tablet (50 mcg total) by mouth daily before breakfast. Per endo [name]     melatonin 5 MG TABS Take 5 mg by mouth at bedtime.     mupirocin ointment (BACTROBAN) 2 % Place 1 application. into the nose 2 (two) times daily. 22 g 0   nitroGLYCERIN (NITROSTAT) 0.4 MG SL tablet DISSOLVE 1 TABLET UNDER THE TONGUE EVERY 5 MINUTES AS NEEDED FOR CHEST PAIN 25 tablet 1   ondansetron (ZOFRAN) 4 MG tablet Take 1 tablet (4 mg total) by mouth every 8 (eight) hours as needed for nausea or vomiting. 30 tablet  0   polyethylene glycol (MIRALAX / GLYCOLAX) 17 g packet Take 17 g by mouth daily as needed. 14 each 0   pramipexole (MIRAPEX) 1 MG tablet Take 1 tablet (1 mg total) by mouth daily. At 4pm 90 tablet 3   RABEprazole (ACIPHEX) 20 MG tablet Take 1 tablet (20 mg total) by mouth daily. 90 tablet 3   Semaglutide, 1 MG/DOSE, (OZEMPIC, 1 MG/DOSE,) 4 MG/3ML SOPN Inject 1 mg into the skin once a week.     sodium chloride (MURO 128) 5 % ophthalmic solution 1 drop as needed for eye irritation.     tamsulosin (FLOMAX) 0.4 MG CAPS capsule Take 1 capsule (0.4 mg total) by mouth daily after breakfast. 90 capsule 3   vitamin C (ASCORBIC ACID) 500 MG tablet Take 1,000 mg by mouth daily as needed.     No facility-administered medications prior to visit.     Per HPI unless specifically indicated in ROS section below Review of Systems  Objective:  BP 126/68   Pulse 68   Temp (!) 97.3 F (36.3 C) (Temporal)   Ht '5\' 8"'$  (1.727 m)   Wt 189 lb 2 oz (85.8 kg)   SpO2 98%   BMI 28.76 kg/m   Wt Readings from Last 3 Encounters:  04/24/22 189 lb 2 oz (85.8 kg)  01/17/22 189 lb 6.4 oz (85.9 kg)  01/03/22 191 lb 5.8 oz (86.8 kg)      Physical Exam Vitals and nursing note reviewed.  Constitutional:      Appearance: Normal appearance. He is not ill-appearing.  HENT:     Mouth/Throat:     Mouth: Mucous membranes are moist.     Pharynx: Oropharynx is clear. No oropharyngeal exudate.  Eyes:     Extraocular Movements: Extraocular movements intact.     Pupils: Pupils are equal, round, and reactive to light.  Cardiovascular:     Rate and Rhythm: Normal rate and regular rhythm.     Pulses: Normal pulses.     Heart sounds: Normal heart sounds.  Pulmonary:     Effort: Pulmonary effort is normal. No respiratory distress.     Breath sounds: Normal breath sounds. No wheezing, rhonchi or rales.  Musculoskeletal:        General: Signs of injury present.     Right lower leg: No edema.     Left lower leg: No edema.      Comments:  Small ulcer at tip of L great toe without surrounding erythema  Ulcer to nail bed without surrounding erythema  Skin:    General: Skin is warm and dry.  Findings: No erythema or rash.  Neurological:     Mental Status: He is alert.  Psychiatric:        Mood and Affect: Mood normal.        Behavior: Behavior normal.       Results for orders placed or performed in visit on 04/24/22  POCT Urinalysis Dipstick (Automated)  Result Value Ref Range   Color, UA yellow    Clarity, UA clear    Glucose, UA Negative Negative   Bilirubin, UA negative    Ketones, UA negative    Spec Grav, UA 1.015 1.010 - 1.025   Blood, UA negative    pH, UA 5.5 5.0 - 8.0   Protein, UA Positive (A) Negative   Urobilinogen, UA 0.2 0.2 or 1.0 E.U./dL   Nitrite, UA negative    Leukocytes, UA Negative Negative   *Note: Due to a large number of results and/or encounters for the requested time period, some results have not been displayed. A complete set of results can be found in Results Review.    Assessment & Plan:   Problem List Items Addressed This Visit     Type 2 diabetes mellitus with hyperglycemia ([place])    Followed by endo, on ozempic and tresiba.       Frequent UTI    Recent UCx grew Enterobacter s/p 10d bactrim course. Notes persistent malaise, nausea and decreased appetite. Will update UA to ensure no signs of ongoing UTI.       Relevant Orders   POCT Urinalysis Dipstick (Automated) (Completed)   Diabetic ulcer of toe associated with type 2 diabetes mellitus, limited to breakdown of skin ([place]) - Primary    Ongoing ulcer to L great to x2 after injury 2+ weeks ago.  No signs of ongoing infection.  Given poorly healing wound, discussed podiatry evaluation - he will call Dr Luana Shu at [place] to schedule appt.  Wound cleaned with alcohol and dressed with triple abx ointment and gauze.  Home instructions provided.         No orders of the defined types were placed in this  encounter.  Orders Placed This Encounter  Procedures   POCT Urinalysis Dipstick (Automated)     Patient Instructions  I recommend dressing change daily with antibiotic ointment for toe wound. Call and schedule appointment with Dr Luana Shu podiatry for further evaluation.  Urine test today to ensure recent UTI fully cleared. Good to see you today.   Follow up plan: Return if symptoms worsen or fail to improve.  [name], MD

## 2022-04-24 NOTE — Assessment & Plan Note (Signed)
Recent UCx grew Enterobacter s/p 10d bactrim course. Notes persistent malaise, nausea and decreased appetite. Will update UA to ensure no signs of ongoing UTI.

## 2022-04-24 NOTE — Assessment & Plan Note (Signed)
Ongoing ulcer to L great to x2 after injury 2+ weeks ago.  No signs of ongoing infection.  Given poorly healing wound, discussed podiatry evaluation - he will call Dr Luana Shu at [place] to schedule appt.  Wound cleaned with alcohol and dressed with triple abx ointment and gauze.  Home instructions provided.

## 2022-04-24 NOTE — Patient Instructions (Addendum)
I recommend dressing change daily with antibiotic ointment for toe wound. Call and schedule appointment with Dr Luana Shu podiatry for further evaluation.  Urine test today to ensure recent UTI fully cleared. Good to see you today.

## 2022-04-24 NOTE — Assessment & Plan Note (Addendum)
Followed by endo, on ozempic and tresiba.

## 2022-04-25 DIAGNOSIS — R2681 Unsteadiness on feet: Secondary | ICD-10-CM | POA: Diagnosis not present

## 2022-04-25 DIAGNOSIS — Z9181 History of falling: Secondary | ICD-10-CM | POA: Diagnosis not present

## 2022-04-25 DIAGNOSIS — F802 Mixed receptive-expressive language disorder: Secondary | ICD-10-CM | POA: Diagnosis not present

## 2022-04-25 DIAGNOSIS — R1312 Dysphagia, oropharyngeal phase: Secondary | ICD-10-CM | POA: Diagnosis not present

## 2022-04-25 DIAGNOSIS — R1310 Dysphagia, unspecified: Secondary | ICD-10-CM | POA: Diagnosis not present

## 2022-04-25 DIAGNOSIS — R479 Unspecified speech disturbances: Secondary | ICD-10-CM | POA: Diagnosis not present

## 2022-04-25 DIAGNOSIS — R2689 Other abnormalities of gait and mobility: Secondary | ICD-10-CM | POA: Diagnosis not present

## 2022-04-26 DIAGNOSIS — U099 Post covid-19 condition, unspecified: Secondary | ICD-10-CM | POA: Diagnosis not present

## 2022-04-26 DIAGNOSIS — I69353 Hemiplegia and hemiparesis following cerebral infarction affecting right non-dominant side: Secondary | ICD-10-CM | POA: Diagnosis not present

## 2022-04-26 DIAGNOSIS — M6281 Muscle weakness (generalized): Secondary | ICD-10-CM | POA: Diagnosis not present

## 2022-04-27 DIAGNOSIS — R2681 Unsteadiness on feet: Secondary | ICD-10-CM | POA: Diagnosis not present

## 2022-04-27 DIAGNOSIS — R2689 Other abnormalities of gait and mobility: Secondary | ICD-10-CM | POA: Diagnosis not present

## 2022-04-27 DIAGNOSIS — Z9181 History of falling: Secondary | ICD-10-CM | POA: Diagnosis not present

## 2022-04-28 DIAGNOSIS — M6281 Muscle weakness (generalized): Secondary | ICD-10-CM | POA: Diagnosis not present

## 2022-04-28 DIAGNOSIS — Z9181 History of falling: Secondary | ICD-10-CM | POA: Diagnosis not present

## 2022-04-28 DIAGNOSIS — U099 Post covid-19 condition, unspecified: Secondary | ICD-10-CM | POA: Diagnosis not present

## 2022-04-28 DIAGNOSIS — R2681 Unsteadiness on feet: Secondary | ICD-10-CM | POA: Diagnosis not present

## 2022-04-28 DIAGNOSIS — R2689 Other abnormalities of gait and mobility: Secondary | ICD-10-CM | POA: Diagnosis not present

## 2022-04-28 DIAGNOSIS — I69353 Hemiplegia and hemiparesis following cerebral infarction affecting right non-dominant side: Secondary | ICD-10-CM | POA: Diagnosis not present

## 2022-05-01 DIAGNOSIS — Z9181 History of falling: Secondary | ICD-10-CM | POA: Diagnosis not present

## 2022-05-01 DIAGNOSIS — R2689 Other abnormalities of gait and mobility: Secondary | ICD-10-CM | POA: Diagnosis not present

## 2022-05-01 DIAGNOSIS — M6281 Muscle weakness (generalized): Secondary | ICD-10-CM | POA: Diagnosis not present

## 2022-05-01 DIAGNOSIS — I69353 Hemiplegia and hemiparesis following cerebral infarction affecting right non-dominant side: Secondary | ICD-10-CM | POA: Diagnosis not present

## 2022-05-01 DIAGNOSIS — R2681 Unsteadiness on feet: Secondary | ICD-10-CM | POA: Diagnosis not present

## 2022-05-01 DIAGNOSIS — U099 Post covid-19 condition, unspecified: Secondary | ICD-10-CM | POA: Diagnosis not present

## 2022-05-03 DIAGNOSIS — Z9181 History of falling: Secondary | ICD-10-CM | POA: Diagnosis not present

## 2022-05-03 DIAGNOSIS — I69353 Hemiplegia and hemiparesis following cerebral infarction affecting right non-dominant side: Secondary | ICD-10-CM | POA: Diagnosis not present

## 2022-05-03 DIAGNOSIS — U099 Post covid-19 condition, unspecified: Secondary | ICD-10-CM | POA: Diagnosis not present

## 2022-05-03 DIAGNOSIS — M6281 Muscle weakness (generalized): Secondary | ICD-10-CM | POA: Diagnosis not present

## 2022-05-03 DIAGNOSIS — R2681 Unsteadiness on feet: Secondary | ICD-10-CM | POA: Diagnosis not present

## 2022-05-03 DIAGNOSIS — R2689 Other abnormalities of gait and mobility: Secondary | ICD-10-CM | POA: Diagnosis not present

## 2022-05-05 DIAGNOSIS — R2681 Unsteadiness on feet: Secondary | ICD-10-CM | POA: Diagnosis not present

## 2022-05-05 DIAGNOSIS — M6281 Muscle weakness (generalized): Secondary | ICD-10-CM | POA: Diagnosis not present

## 2022-05-05 DIAGNOSIS — U099 Post covid-19 condition, unspecified: Secondary | ICD-10-CM | POA: Diagnosis not present

## 2022-05-05 DIAGNOSIS — R479 Unspecified speech disturbances: Secondary | ICD-10-CM | POA: Diagnosis not present

## 2022-05-05 DIAGNOSIS — R2689 Other abnormalities of gait and mobility: Secondary | ICD-10-CM | POA: Diagnosis not present

## 2022-05-05 DIAGNOSIS — Z9181 History of falling: Secondary | ICD-10-CM | POA: Diagnosis not present

## 2022-05-05 DIAGNOSIS — R1310 Dysphagia, unspecified: Secondary | ICD-10-CM | POA: Diagnosis not present

## 2022-05-05 DIAGNOSIS — I69353 Hemiplegia and hemiparesis following cerebral infarction affecting right non-dominant side: Secondary | ICD-10-CM | POA: Diagnosis not present

## 2022-05-05 DIAGNOSIS — R1312 Dysphagia, oropharyngeal phase: Secondary | ICD-10-CM | POA: Diagnosis not present

## 2022-05-05 DIAGNOSIS — F802 Mixed receptive-expressive language disorder: Secondary | ICD-10-CM | POA: Diagnosis not present

## 2022-05-07 DIAGNOSIS — R350 Frequency of micturition: Secondary | ICD-10-CM | POA: Diagnosis not present

## 2022-05-07 DIAGNOSIS — N3001 Acute cystitis with hematuria: Secondary | ICD-10-CM | POA: Diagnosis not present

## 2022-05-08 DIAGNOSIS — Z9181 History of falling: Secondary | ICD-10-CM | POA: Diagnosis not present

## 2022-05-08 DIAGNOSIS — R2681 Unsteadiness on feet: Secondary | ICD-10-CM | POA: Diagnosis not present

## 2022-05-08 DIAGNOSIS — R2689 Other abnormalities of gait and mobility: Secondary | ICD-10-CM | POA: Diagnosis not present

## 2022-05-09 ENCOUNTER — Encounter: Payer: Self-pay | Admitting: Physical Medicine & Rehabilitation

## 2022-05-09 ENCOUNTER — Encounter: Payer: Medicare Other | Attending: Physical Medicine & Rehabilitation | Admitting: Physical Medicine & Rehabilitation

## 2022-05-09 VITALS — BP 135/81 | HR 67 | Ht 68.0 in | Wt 192.2 lb

## 2022-05-09 DIAGNOSIS — G8110 Spastic hemiplegia affecting unspecified side: Secondary | ICD-10-CM | POA: Diagnosis not present

## 2022-05-09 DIAGNOSIS — I69353 Hemiplegia and hemiparesis following cerebral infarction affecting right non-dominant side: Secondary | ICD-10-CM | POA: Diagnosis not present

## 2022-05-09 DIAGNOSIS — U099 Post covid-19 condition, unspecified: Secondary | ICD-10-CM | POA: Diagnosis not present

## 2022-05-09 DIAGNOSIS — R2689 Other abnormalities of gait and mobility: Secondary | ICD-10-CM | POA: Diagnosis not present

## 2022-05-09 DIAGNOSIS — M6281 Muscle weakness (generalized): Secondary | ICD-10-CM | POA: Diagnosis not present

## 2022-05-09 DIAGNOSIS — R2681 Unsteadiness on feet: Secondary | ICD-10-CM | POA: Diagnosis not present

## 2022-05-09 DIAGNOSIS — Z9181 History of falling: Secondary | ICD-10-CM | POA: Diagnosis not present

## 2022-05-09 NOTE — Patient Instructions (Addendum)
Use voltaren gel Right knee  If knee acts up again recommend repeat knee injection .    Call [facility] for replacement of velcro on Right AFO

## 2022-05-09 NOTE — Progress Notes (Signed)
Subjective:    Patient ID: James Spurr, MD, male    DOB: Aug 19, 1931, 88 y.o.   MRN: [id number] CVA Left March 11, 2012 HPI Dr. Garriga is an 88 year old male with history of left basal ganglia infarct in 2013.  He is here today with gait disorder related to chronic stroke.  He also has intermittent right knee pain.  He has had knee injections from orthopedics which were helpful in the past.  Right knee x-rays were reviewed and unremarkable.  He uses a right AFO.  He is having problems with cuff closure.  Examined the cuff and the Velcro is worn out.  Patient and caregiver were instructed to call [facility] in [place] soon to get new Velcro applied. Walking worse since recent bout of cystitis Pt receiving PT 2x per week.  Pt resides at [facility] independent living .  Occasionally drives to MD offices Had ~ 3 falls just prior to admission for syncope in April 2023 No falls since hospitalization.  During his hospitalization MRI of the brain was performed and did not show any new infarcts Pain Inventory Average Pain 0 Pain Right Now 0 My pain is  denies pain  In the last 24 hours, has pain interfered with the following? General activity 0 Relation with others 0 Enjoyment of life 0 What TIME of day is your pain at its worst? varies Sleep (in general)  denies pain  Pain is worse with:  denies pain Pain improves with:  denies pain Relief from Meds:  denies pain  Family History  Problem Relation Age of Onset   Stroke Father    Lung cancer Father 36        smoker   Alcoholism Sister    Kidney disease Sister    Stroke Brother    Diabetes Brother 69   Lung cancer Brother    Coronary artery disease Neg Hx    Sleep apnea Neg Hx    Social History   Socioeconomic History   Marital status: Divorced    Spouse name: Not on file   Number of children: 2   Years of education: MD   Highest education level: Not on file  Occupational History   Occupation: retired     Fish farm manager: RETIRED  Tobacco Use   Smoking status: Never   Smokeless tobacco: Never   Tobacco comments:    "used to smoke pipe when I was youngerFinancial risk analyst Use: Never used  Substance and Sexual Activity   Alcohol use: Yes    Alcohol/week: 4.0 standard drinks of alcohol    Types: 2 Glasses of wine, 2 Shots of liquor per week    Comment: occasional   Drug use: No   Sexual activity: Yes  Other Topics Concern   Not on file  Social History Narrative   Moved into [place] in [place] 12/2020   Caffeine: 1 cup coffee/day     Occupation: ophthalmologist, retired   Edu: MD   Activity: going to gym 3x/wk with trainer   Diet: some water, fruits/vegetables some, red meat a few times, fish 1x/wk   POA - [name] (Daughter)   Social Determinants of Health   Financial Resource Strain: Low Risk  (12/30/2020)   Overall Financial Resource Strain (CARDIA)    Difficulty of Paying Living Expenses: Not hard at all  Food Insecurity: Not on file  Transportation Needs: Not on file  Physical Activity: Not on file  Stress: Not  on file  Social Connections: Not on file   Past Surgical History:  Procedure Laterality Date   ABI  2007   WNL   CARDIAC CATHETERIZATION  2004   normal; [facility]   CARDIOVASCULAR STRESS TEST  02/2014   WNL, low risk scan, EF 68%   CAROTID ENDARTERECTOMY     CATARACT EXTRACTION W/ INTRAOCULAR LENS IMPLANT  11//2012 - 08/2011   COLONOSCOPY  03/2011   poor prep, unable to biopsy polyp in tic fold, rpt 3 mo [name])   COLONOSCOPY  06/2011   mult polyps adenomatous, severe diverticulosis, rpt 3 yrs [name])   CYSTOSCOPY WITH URETHRAL DILATATION N/A 07/24/2013   Procedure: CYSTOSCOPY WITH URETHRAL DILATATION;  Surgeon: [name], MD;  Location: [facility];  Service: Urology;  Laterality: N/A;   ESOPHAGOGASTRODUODENOSCOPY  2006   duodenitis, medual HH, Grade 2 reflux, mild gastritis   ESOPHAGOGASTRODUODENOSCOPY  07/2011   mild  chronic gastritis   HEMORRHOID SURGERY  1991   INGUINAL HERNIA REPAIR  1981; 2002   left, then repaired   MRI brain  04/2010   WNL   PROSTATE SURGERY  02/2009   PVP (laser)   SKIN CANCER EXCISION  2002   bridge of nose, BCC   TONSILLECTOMY AND ADENOIDECTOMY  ~ [place] N/A 07/24/2013   Procedure: TRANSURETHRAL INCISION OF THE PROSTATE (TUIP);  Surgeon: [name], MD;  [facility]: [facility];  Service: Urology;  Laterality: N/A;   URETHRAL DILATION  2014   tannenbaum   Past Surgical History:  Procedure Laterality Date   ABI  2007   WNL   CARDIAC CATHETERIZATION  2004   normal; [facility]   CARDIOVASCULAR STRESS TEST  02/2014   WNL, low risk scan, EF 68%   CAROTID ENDARTERECTOMY     CATARACT EXTRACTION W/ INTRAOCULAR LENS IMPLANT  11//2012 - 08/2011   COLONOSCOPY  03/2011   poor prep, unable to biopsy polyp in tic fold, rpt 3 mo [name])   COLONOSCOPY  06/2011   mult polyps adenomatous, severe diverticulosis, rpt 3 yrs [name])   CYSTOSCOPY WITH URETHRAL DILATATION N/A 07/24/2013   Procedure: CYSTOSCOPY WITH URETHRAL DILATATION;  Surgeon: [name], MD;  [facility];  Service: Urology;  Laterality: N/A;   ESOPHAGOGASTRODUODENOSCOPY  2006   duodenitis, medual HH, Grade 2 reflux, mild gastritis   ESOPHAGOGASTRODUODENOSCOPY  07/2011   mild chronic gastritis   HEMORRHOID SURGERY  1991   INGUINAL HERNIA REPAIR  1981; 2002   left, then repaired   MRI brain  04/2010   WNL   PROSTATE SURGERY  02/2009   PVP (laser)   SKIN CANCER EXCISION  2002   bridge of nose, BCC   TONSILLECTOMY AND ADENOIDECTOMY  ~ [place] N/A 07/24/2013   Procedure: TRANSURETHRAL INCISION OF THE PROSTATE (TUIP);  Surgeon: [name], MD;  [facility];  Service: Urology;  Laterality: N/A;   URETHRAL DILATION  2014   [facility]baum   Past Medical History:  Diagnosis Date   Anemia    Basal cell carcinoma 2018   R  temple    Basal cell carcinoma of nose 2002   bridge of nose   BPH with obstruction/lower urinary tract symptoms    failed flomax, uroxatral, myrbetriq - sees urology [name]) pending videourodynamics   CKD (chronic kidney disease) stage 3, GFR 30-59 ml/min ([place]) 03/11/2012   Renal US 02/2014 - multiple kidney cysts  CVA (cerebral infarction) 03/07/2012   slurred speech; right hemplegia, left handed - completed PT 07/2014 (17 sessions)   Dehydration    Depression    mild, on cymbalta per prior PCP   Erectile dysfunction    per urology (prostaglandin injection)   GERD (gastroesophageal reflux disease)    with sliding HH   H/O hiatal hernia    History of kidney problems 12/2010   lacerated left kidney after fall   HLD (hyperlipidemia)    Hx of basal cell carcinoma 07/10/2017   R. lat. forehead near hair line   Kidney cysts 02/2014   bilateral (renal US)   Migraines 03/11/2012   now rare   OSA on CPAP    sleep study 01/2014 - severe, CPAP at 10, previously on nuvigil (Dr. [name])   Palpitations 01/2012   holter WNL, rare PAC/PVCs   Presbycusis of both ears 2016   rec amplification [facility])   Restless leg syndrome    well controlled on mirapex   Rosacea    [name]   Stroke [facility])    TIA (transient ischemic attack)    Type 2 diabetes, controlled, with neuropathy ([place]) 2011   [name] in [place]   BP 135/81   Pulse 67   Ht '5\' 8"'$  (1.727 m)   Wt 192 lb 3.2 oz (87.2 kg)   SpO2 98%   BMI 29.22 kg/m   Opioid Risk Score:   Fall Risk Score:  `1  Depression screen [facility] 2/9     05/09/2022   11:07 AM 06/13/2021    3:52 PM 06/01/2020   12:39 PM 04/22/2020   10:01 AM 01/22/2019    3:05 PM 03/08/2018    1:23 PM 12/14/2017   11:42 AM  Depression screen PHQ 2/9  Decreased Interest 0 3 0 0 0 0 0  Down, Depressed, Hopeless 0 0 0 0 0 1 1  PHQ - 2 Score 0 3 0 0 0 1 1  Altered sleeping  0 1 1 0    Tired, decreased energy  '2 1 1 '$ 0    Change in appetite  0 1 1 0    Feeling bad or  failure about yourself   0 0 0 0    Trouble concentrating  '1 1 2 '$ 0    Moving slowly or fidgety/restless  1 0 3 0    Suicidal thoughts  0 0 0 0    PHQ-9 Score  '7 4 8 '$ 0    Difficult doing work/chores     Not difficult at all        Review of Systems  All other systems reviewed and are negative.     Objective:   Physical Exam Vitals and nursing note reviewed.  Constitutional:      Appearance: He is obese.  HENT:     Head: Normocephalic and atraumatic.  Eyes:     Extraocular Movements: Extraocular movements intact.     Conjunctiva/sclera: Conjunctivae normal.     Pupils: Pupils are equal, round, and reactive to light.  Musculoskeletal:     Comments: No pain with bilateral shoulder abduction no pain with elbow or wrist motion Right knee no tenderness along the medial or lateral joint line no tenderness along the quadricep or patellar tendon no pain with passive range of motion  Skin:    General: Skin is warm and dry.  Neurological:     General: No focal deficit present.     Mental Status: He is alert  and oriented to person, place, and time.  Psychiatric:        Mood and Affect: Mood normal.        Behavior: Behavior normal.           Assessment & Plan:   1.  History of remote left basal ganglia infarct with chronic right spastic hemiparesis.  He is doing relatively well at this time he is living in an independent living facility he does have a home health caregiver that accompanies him at times to physician visits as well as provide some other assistance around his apartment.  He does continue to drive although mainly shorter distances. He will continue his physical therapy at the independent living facility.  I will see him back in 1 year he will continue to follow-up with his primary care physician as well as neurology 2.  Chronic right knee pain x-ray from last year unremarkable likely has meniscal degeneration but no effusion is noted on examination today.  No pain with  range of motion or to palpation.  The patient has seen orthopedics for this if he has an exacerbation he may benefit from another knee injection.

## 2022-05-10 DIAGNOSIS — R2681 Unsteadiness on feet: Secondary | ICD-10-CM | POA: Diagnosis not present

## 2022-05-10 DIAGNOSIS — Z9181 History of falling: Secondary | ICD-10-CM | POA: Diagnosis not present

## 2022-05-10 DIAGNOSIS — I69353 Hemiplegia and hemiparesis following cerebral infarction affecting right non-dominant side: Secondary | ICD-10-CM | POA: Diagnosis not present

## 2022-05-10 DIAGNOSIS — M6281 Muscle weakness (generalized): Secondary | ICD-10-CM | POA: Diagnosis not present

## 2022-05-10 DIAGNOSIS — U099 Post covid-19 condition, unspecified: Secondary | ICD-10-CM | POA: Diagnosis not present

## 2022-05-10 DIAGNOSIS — R2689 Other abnormalities of gait and mobility: Secondary | ICD-10-CM | POA: Diagnosis not present

## 2022-05-11 DIAGNOSIS — R1310 Dysphagia, unspecified: Secondary | ICD-10-CM | POA: Diagnosis not present

## 2022-05-11 DIAGNOSIS — R479 Unspecified speech disturbances: Secondary | ICD-10-CM | POA: Diagnosis not present

## 2022-05-11 DIAGNOSIS — R1312 Dysphagia, oropharyngeal phase: Secondary | ICD-10-CM | POA: Diagnosis not present

## 2022-05-11 DIAGNOSIS — F802 Mixed receptive-expressive language disorder: Secondary | ICD-10-CM | POA: Diagnosis not present

## 2022-05-12 DIAGNOSIS — M6281 Muscle weakness (generalized): Secondary | ICD-10-CM | POA: Diagnosis not present

## 2022-05-12 DIAGNOSIS — I69353 Hemiplegia and hemiparesis following cerebral infarction affecting right non-dominant side: Secondary | ICD-10-CM | POA: Diagnosis not present

## 2022-05-12 DIAGNOSIS — U099 Post covid-19 condition, unspecified: Secondary | ICD-10-CM | POA: Diagnosis not present

## 2022-05-15 DIAGNOSIS — M6281 Muscle weakness (generalized): Secondary | ICD-10-CM | POA: Diagnosis not present

## 2022-05-15 DIAGNOSIS — U099 Post covid-19 condition, unspecified: Secondary | ICD-10-CM | POA: Diagnosis not present

## 2022-05-15 DIAGNOSIS — R2689 Other abnormalities of gait and mobility: Secondary | ICD-10-CM | POA: Diagnosis not present

## 2022-05-15 DIAGNOSIS — R2681 Unsteadiness on feet: Secondary | ICD-10-CM | POA: Diagnosis not present

## 2022-05-15 DIAGNOSIS — Z9181 History of falling: Secondary | ICD-10-CM | POA: Diagnosis not present

## 2022-05-15 DIAGNOSIS — I69353 Hemiplegia and hemiparesis following cerebral infarction affecting right non-dominant side: Secondary | ICD-10-CM | POA: Diagnosis not present

## 2022-05-16 DIAGNOSIS — R1310 Dysphagia, unspecified: Secondary | ICD-10-CM | POA: Diagnosis not present

## 2022-05-16 DIAGNOSIS — R1312 Dysphagia, oropharyngeal phase: Secondary | ICD-10-CM | POA: Diagnosis not present

## 2022-05-16 DIAGNOSIS — F802 Mixed receptive-expressive language disorder: Secondary | ICD-10-CM | POA: Diagnosis not present

## 2022-05-16 DIAGNOSIS — R479 Unspecified speech disturbances: Secondary | ICD-10-CM | POA: Diagnosis not present

## 2022-05-17 DIAGNOSIS — I69353 Hemiplegia and hemiparesis following cerebral infarction affecting right non-dominant side: Secondary | ICD-10-CM | POA: Diagnosis not present

## 2022-05-17 DIAGNOSIS — R2689 Other abnormalities of gait and mobility: Secondary | ICD-10-CM | POA: Diagnosis not present

## 2022-05-17 DIAGNOSIS — U099 Post covid-19 condition, unspecified: Secondary | ICD-10-CM | POA: Diagnosis not present

## 2022-05-17 DIAGNOSIS — R2681 Unsteadiness on feet: Secondary | ICD-10-CM | POA: Diagnosis not present

## 2022-05-17 DIAGNOSIS — M6281 Muscle weakness (generalized): Secondary | ICD-10-CM | POA: Diagnosis not present

## 2022-05-17 DIAGNOSIS — Z9181 History of falling: Secondary | ICD-10-CM | POA: Diagnosis not present

## 2022-05-19 DIAGNOSIS — M6281 Muscle weakness (generalized): Secondary | ICD-10-CM | POA: Diagnosis not present

## 2022-05-19 DIAGNOSIS — I69353 Hemiplegia and hemiparesis following cerebral infarction affecting right non-dominant side: Secondary | ICD-10-CM | POA: Diagnosis not present

## 2022-05-19 DIAGNOSIS — U099 Post covid-19 condition, unspecified: Secondary | ICD-10-CM | POA: Diagnosis not present

## 2022-05-22 DIAGNOSIS — I69353 Hemiplegia and hemiparesis following cerebral infarction affecting right non-dominant side: Secondary | ICD-10-CM | POA: Diagnosis not present

## 2022-05-22 DIAGNOSIS — U099 Post covid-19 condition, unspecified: Secondary | ICD-10-CM | POA: Diagnosis not present

## 2022-05-22 DIAGNOSIS — M6281 Muscle weakness (generalized): Secondary | ICD-10-CM | POA: Diagnosis not present

## 2022-05-23 DIAGNOSIS — R1310 Dysphagia, unspecified: Secondary | ICD-10-CM | POA: Diagnosis not present

## 2022-05-23 DIAGNOSIS — R2681 Unsteadiness on feet: Secondary | ICD-10-CM | POA: Diagnosis not present

## 2022-05-23 DIAGNOSIS — F802 Mixed receptive-expressive language disorder: Secondary | ICD-10-CM | POA: Diagnosis not present

## 2022-05-23 DIAGNOSIS — Z9181 History of falling: Secondary | ICD-10-CM | POA: Diagnosis not present

## 2022-05-23 DIAGNOSIS — E1165 Type 2 diabetes mellitus with hyperglycemia: Secondary | ICD-10-CM | POA: Diagnosis not present

## 2022-05-23 DIAGNOSIS — R2689 Other abnormalities of gait and mobility: Secondary | ICD-10-CM | POA: Diagnosis not present

## 2022-05-23 DIAGNOSIS — R479 Unspecified speech disturbances: Secondary | ICD-10-CM | POA: Diagnosis not present

## 2022-05-23 DIAGNOSIS — R1312 Dysphagia, oropharyngeal phase: Secondary | ICD-10-CM | POA: Diagnosis not present

## 2022-05-25 DIAGNOSIS — Z9181 History of falling: Secondary | ICD-10-CM | POA: Diagnosis not present

## 2022-05-25 DIAGNOSIS — I69353 Hemiplegia and hemiparesis following cerebral infarction affecting right non-dominant side: Secondary | ICD-10-CM | POA: Diagnosis not present

## 2022-05-25 DIAGNOSIS — R2689 Other abnormalities of gait and mobility: Secondary | ICD-10-CM | POA: Diagnosis not present

## 2022-05-25 DIAGNOSIS — R2681 Unsteadiness on feet: Secondary | ICD-10-CM | POA: Diagnosis not present

## 2022-05-25 DIAGNOSIS — M6281 Muscle weakness (generalized): Secondary | ICD-10-CM | POA: Diagnosis not present

## 2022-05-25 DIAGNOSIS — U099 Post covid-19 condition, unspecified: Secondary | ICD-10-CM | POA: Diagnosis not present

## 2022-05-26 DIAGNOSIS — H401132 Primary open-angle glaucoma, bilateral, moderate stage: Secondary | ICD-10-CM | POA: Diagnosis not present

## 2022-05-29 DIAGNOSIS — R2689 Other abnormalities of gait and mobility: Secondary | ICD-10-CM | POA: Diagnosis not present

## 2022-05-29 DIAGNOSIS — R2681 Unsteadiness on feet: Secondary | ICD-10-CM | POA: Diagnosis not present

## 2022-05-29 DIAGNOSIS — U099 Post covid-19 condition, unspecified: Secondary | ICD-10-CM | POA: Diagnosis not present

## 2022-05-29 DIAGNOSIS — M6281 Muscle weakness (generalized): Secondary | ICD-10-CM | POA: Diagnosis not present

## 2022-05-29 DIAGNOSIS — Z9181 History of falling: Secondary | ICD-10-CM | POA: Diagnosis not present

## 2022-05-29 DIAGNOSIS — I69353 Hemiplegia and hemiparesis following cerebral infarction affecting right non-dominant side: Secondary | ICD-10-CM | POA: Diagnosis not present

## 2022-05-31 DIAGNOSIS — R2689 Other abnormalities of gait and mobility: Secondary | ICD-10-CM | POA: Diagnosis not present

## 2022-05-31 DIAGNOSIS — Z9181 History of falling: Secondary | ICD-10-CM | POA: Diagnosis not present

## 2022-05-31 DIAGNOSIS — R2681 Unsteadiness on feet: Secondary | ICD-10-CM | POA: Diagnosis not present

## 2022-06-01 DIAGNOSIS — Z9181 History of falling: Secondary | ICD-10-CM | POA: Diagnosis not present

## 2022-06-01 DIAGNOSIS — M6281 Muscle weakness (generalized): Secondary | ICD-10-CM | POA: Diagnosis not present

## 2022-06-01 DIAGNOSIS — I69353 Hemiplegia and hemiparesis following cerebral infarction affecting right non-dominant side: Secondary | ICD-10-CM | POA: Diagnosis not present

## 2022-06-01 DIAGNOSIS — U099 Post covid-19 condition, unspecified: Secondary | ICD-10-CM | POA: Diagnosis not present

## 2022-06-01 DIAGNOSIS — R2681 Unsteadiness on feet: Secondary | ICD-10-CM | POA: Diagnosis not present

## 2022-06-01 DIAGNOSIS — R2689 Other abnormalities of gait and mobility: Secondary | ICD-10-CM | POA: Diagnosis not present

## 2022-06-02 ENCOUNTER — Other Ambulatory Visit: Payer: Self-pay

## 2022-06-02 DIAGNOSIS — R1312 Dysphagia, oropharyngeal phase: Secondary | ICD-10-CM | POA: Diagnosis not present

## 2022-06-02 DIAGNOSIS — R1310 Dysphagia, unspecified: Secondary | ICD-10-CM | POA: Diagnosis not present

## 2022-06-02 DIAGNOSIS — F802 Mixed receptive-expressive language disorder: Secondary | ICD-10-CM | POA: Diagnosis not present

## 2022-06-02 DIAGNOSIS — R479 Unspecified speech disturbances: Secondary | ICD-10-CM | POA: Diagnosis not present

## 2022-06-02 NOTE — Telephone Encounter (Signed)
Flovent inhaler Last rx: 01/06/22, #1 ea Last OV:  04/24/22, diabetic toe ulcer Next OV:  07/31/22, CPE

## 2022-06-03 MED ORDER — FLUTICASONE PROPIONATE HFA 110 MCG/ACT IN AERO: 1.0000 | INHALATION_SPRAY | Freq: Two times a day (BID) | RESPIRATORY_TRACT | 1 refills | Status: DC

## 2022-06-03 NOTE — Telephone Encounter (Signed)
ERx 

## 2022-06-05 DIAGNOSIS — Z9181 History of falling: Secondary | ICD-10-CM | POA: Diagnosis not present

## 2022-06-05 DIAGNOSIS — U099 Post covid-19 condition, unspecified: Secondary | ICD-10-CM | POA: Diagnosis not present

## 2022-06-05 DIAGNOSIS — I69353 Hemiplegia and hemiparesis following cerebral infarction affecting right non-dominant side: Secondary | ICD-10-CM | POA: Diagnosis not present

## 2022-06-05 DIAGNOSIS — R2681 Unsteadiness on feet: Secondary | ICD-10-CM | POA: Diagnosis not present

## 2022-06-05 DIAGNOSIS — R2689 Other abnormalities of gait and mobility: Secondary | ICD-10-CM | POA: Diagnosis not present

## 2022-06-05 DIAGNOSIS — M6281 Muscle weakness (generalized): Secondary | ICD-10-CM | POA: Diagnosis not present

## 2022-06-07 DIAGNOSIS — R2689 Other abnormalities of gait and mobility: Secondary | ICD-10-CM | POA: Diagnosis not present

## 2022-06-07 DIAGNOSIS — Z9181 History of falling: Secondary | ICD-10-CM | POA: Diagnosis not present

## 2022-06-07 DIAGNOSIS — R2681 Unsteadiness on feet: Secondary | ICD-10-CM | POA: Diagnosis not present

## 2022-06-08 ENCOUNTER — Ambulatory Visit: Payer: Medicare Other

## 2022-06-08 DIAGNOSIS — Z9181 History of falling: Secondary | ICD-10-CM | POA: Diagnosis not present

## 2022-06-08 DIAGNOSIS — R2681 Unsteadiness on feet: Secondary | ICD-10-CM | POA: Diagnosis not present

## 2022-06-08 DIAGNOSIS — I69353 Hemiplegia and hemiparesis following cerebral infarction affecting right non-dominant side: Secondary | ICD-10-CM | POA: Diagnosis not present

## 2022-06-08 DIAGNOSIS — U099 Post covid-19 condition, unspecified: Secondary | ICD-10-CM | POA: Diagnosis not present

## 2022-06-08 DIAGNOSIS — M6281 Muscle weakness (generalized): Secondary | ICD-10-CM | POA: Diagnosis not present

## 2022-06-08 DIAGNOSIS — R2689 Other abnormalities of gait and mobility: Secondary | ICD-10-CM | POA: Diagnosis not present

## 2022-06-09 DIAGNOSIS — B351 Tinea unguium: Secondary | ICD-10-CM | POA: Diagnosis not present

## 2022-06-09 DIAGNOSIS — G8190 Hemiplegia, unspecified affecting unspecified side: Secondary | ICD-10-CM | POA: Diagnosis not present

## 2022-06-09 DIAGNOSIS — I890 Lymphedema, not elsewhere classified: Secondary | ICD-10-CM | POA: Diagnosis not present

## 2022-06-09 DIAGNOSIS — E1142 Type 2 diabetes mellitus with diabetic polyneuropathy: Secondary | ICD-10-CM | POA: Diagnosis not present

## 2022-06-09 DIAGNOSIS — M7989 Other specified soft tissue disorders: Secondary | ICD-10-CM | POA: Diagnosis not present

## 2022-06-12 DIAGNOSIS — M6281 Muscle weakness (generalized): Secondary | ICD-10-CM | POA: Diagnosis not present

## 2022-06-12 DIAGNOSIS — U099 Post covid-19 condition, unspecified: Secondary | ICD-10-CM | POA: Diagnosis not present

## 2022-06-12 DIAGNOSIS — R2681 Unsteadiness on feet: Secondary | ICD-10-CM | POA: Diagnosis not present

## 2022-06-12 DIAGNOSIS — I69353 Hemiplegia and hemiparesis following cerebral infarction affecting right non-dominant side: Secondary | ICD-10-CM | POA: Diagnosis not present

## 2022-06-12 DIAGNOSIS — Z9181 History of falling: Secondary | ICD-10-CM | POA: Diagnosis not present

## 2022-06-12 DIAGNOSIS — R2689 Other abnormalities of gait and mobility: Secondary | ICD-10-CM | POA: Diagnosis not present

## 2022-06-15 DIAGNOSIS — Z9181 History of falling: Secondary | ICD-10-CM | POA: Diagnosis not present

## 2022-06-15 DIAGNOSIS — M6281 Muscle weakness (generalized): Secondary | ICD-10-CM | POA: Diagnosis not present

## 2022-06-15 DIAGNOSIS — I69353 Hemiplegia and hemiparesis following cerebral infarction affecting right non-dominant side: Secondary | ICD-10-CM | POA: Diagnosis not present

## 2022-06-15 DIAGNOSIS — R2689 Other abnormalities of gait and mobility: Secondary | ICD-10-CM | POA: Diagnosis not present

## 2022-06-15 DIAGNOSIS — U099 Post covid-19 condition, unspecified: Secondary | ICD-10-CM | POA: Diagnosis not present

## 2022-06-15 DIAGNOSIS — R2681 Unsteadiness on feet: Secondary | ICD-10-CM | POA: Diagnosis not present

## 2022-06-16 DIAGNOSIS — R2681 Unsteadiness on feet: Secondary | ICD-10-CM | POA: Diagnosis not present

## 2022-06-16 DIAGNOSIS — Z9181 History of falling: Secondary | ICD-10-CM | POA: Diagnosis not present

## 2022-06-16 DIAGNOSIS — R2689 Other abnormalities of gait and mobility: Secondary | ICD-10-CM | POA: Diagnosis not present

## 2022-06-19 ENCOUNTER — Encounter: Payer: Medicare Other | Admitting: Family Medicine

## 2022-06-22 DIAGNOSIS — E1165 Type 2 diabetes mellitus with hyperglycemia: Secondary | ICD-10-CM | POA: Diagnosis not present

## 2022-06-27 DIAGNOSIS — E039 Hypothyroidism, unspecified: Secondary | ICD-10-CM | POA: Diagnosis not present

## 2022-06-27 DIAGNOSIS — E119 Type 2 diabetes mellitus without complications: Secondary | ICD-10-CM | POA: Diagnosis not present

## 2022-06-27 DIAGNOSIS — I69351 Hemiplegia and hemiparesis following cerebral infarction affecting right dominant side: Secondary | ICD-10-CM | POA: Diagnosis not present

## 2022-06-27 DIAGNOSIS — K219 Gastro-esophageal reflux disease without esophagitis: Secondary | ICD-10-CM | POA: Diagnosis not present

## 2022-06-27 DIAGNOSIS — H4010X2 Unspecified open-angle glaucoma, moderate stage: Secondary | ICD-10-CM | POA: Diagnosis not present

## 2022-06-27 DIAGNOSIS — H401110 Primary open-angle glaucoma, right eye, stage unspecified: Secondary | ICD-10-CM | POA: Diagnosis not present

## 2022-06-27 DIAGNOSIS — Z794 Long term (current) use of insulin: Secondary | ICD-10-CM | POA: Diagnosis not present

## 2022-06-27 DIAGNOSIS — H401112 Primary open-angle glaucoma, right eye, moderate stage: Secondary | ICD-10-CM | POA: Diagnosis not present

## 2022-07-04 DIAGNOSIS — Z8631 Personal history of diabetic foot ulcer: Secondary | ICD-10-CM | POA: Diagnosis not present

## 2022-07-04 DIAGNOSIS — E1165 Type 2 diabetes mellitus with hyperglycemia: Secondary | ICD-10-CM | POA: Diagnosis not present

## 2022-07-04 DIAGNOSIS — L84 Corns and callosities: Secondary | ICD-10-CM | POA: Diagnosis not present

## 2022-07-14 DIAGNOSIS — R2681 Unsteadiness on feet: Secondary | ICD-10-CM | POA: Diagnosis not present

## 2022-07-14 DIAGNOSIS — R2689 Other abnormalities of gait and mobility: Secondary | ICD-10-CM | POA: Diagnosis not present

## 2022-07-14 DIAGNOSIS — Z9181 History of falling: Secondary | ICD-10-CM | POA: Diagnosis not present

## 2022-07-17 DIAGNOSIS — M6281 Muscle weakness (generalized): Secondary | ICD-10-CM | POA: Diagnosis not present

## 2022-07-19 ENCOUNTER — Other Ambulatory Visit: Payer: Self-pay | Admitting: Family Medicine

## 2022-07-19 DIAGNOSIS — R2681 Unsteadiness on feet: Secondary | ICD-10-CM | POA: Diagnosis not present

## 2022-07-19 DIAGNOSIS — Z9181 History of falling: Secondary | ICD-10-CM | POA: Diagnosis not present

## 2022-07-19 DIAGNOSIS — R2689 Other abnormalities of gait and mobility: Secondary | ICD-10-CM | POA: Diagnosis not present

## 2022-07-20 ENCOUNTER — Encounter: Payer: Self-pay | Admitting: Cardiovascular Disease

## 2022-07-20 ENCOUNTER — Ambulatory Visit: Payer: Medicare PPO | Attending: Cardiovascular Disease | Admitting: Cardiovascular Disease

## 2022-07-20 VITALS — BP 140/60 | HR 77 | Ht 68.0 in | Wt 195.5 lb

## 2022-07-20 DIAGNOSIS — R001 Bradycardia, unspecified: Secondary | ICD-10-CM | POA: Diagnosis not present

## 2022-07-20 DIAGNOSIS — M6281 Muscle weakness (generalized): Secondary | ICD-10-CM | POA: Diagnosis not present

## 2022-07-20 NOTE — Patient Instructions (Signed)
Medication Instructions:  Your physician recommends that you continue on your current medications as directed. Please refer to the Current Medication list given to you today.  *If you need a refill on your cardiac medications before your next appointment, please call your pharmacy*   Lab Work: None ordered If you have labs (blood work) drawn today and your tests are completely normal, you will receive your results only by: [place] (if you have MyChart) OR A paper copy in the mail If you have any lab test that is abnormal or we need to change your treatment, we will call you to review the results.   Follow-Up: At [facility], you and your health needs are our priority.  As part of our continuing mission to provide you with exceptional heart care, we have created designated Provider Care Teams.  These Care Teams include your primary Cardiologist (physician) and Advanced Practice Providers (APPs -  Physician Assistants and Nurse Practitioners) who all work together to provide you with the care you need, when you need it.  Your next appointment:   As needed  Important Information About Sugar

## 2022-07-20 NOTE — Progress Notes (Signed)
Cardiology Office Note   Date:  07/20/2022   ID:  James Spurr, MD, DOB [birth year], MRN [id number]  PCP:  [name], MD  Cardiologist:   Kathlyn Sacramento, MD   Chief Complaint  Patient presents with   OTHER    6 month f/u c/o chest discomfort. Meds reviewed verbally with pt.      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  He is a retired Chief Strategy Officer.  He has history of type 2 diabetes, chronic kidney disease, hypothyroidism and previous stroke.   The patient lives at [facility] independent living.  He was hospitalized in April of this year after he was found down on the floor by his neighbor. The patient went to sleep the night before and did not get up out of bed until he was found by the neighbor.  He reported feeling weak the days preceding.  In the ED, he was found to be hypoglycemic with a blood sugar of 46 that improved with dextrose infusion.  In addition, he was found to have low-grade fever and tested positive for COVID.  His CPK was mildly elevated at 1187. During his hospital stay, he was noted to have intermittent bradycardia with some pauses the longest was 2.7 seconds.  Mobitz 2 second-degree AV block was also noted.  He had an echocardiogram done which showed normal LV systolic function with mildly calcified aortic valve without significant stenosis.  His TSH was normal.   Subsequent outpatient ZIO monitor in May showed sinus rhythm with a minimum heart rate of 44 bpm, 13 short SVTs the longest lasted 16 beats.  He has been doing well with no recent chest pain, shortness of breath, dizziness or syncope.   Past Medical History:  Diagnosis Date   Anemia    Basal cell carcinoma 2018   R temple    Basal cell carcinoma of nose 2002   bridge of nose   BPH with obstruction/lower urinary tract symptoms    failed flomax, uroxatral, myrbetriq - sees urology [name])  pending videourodynamics   CKD (chronic kidney disease) stage 3, GFR 30-59 ml/min ([place]) 03/11/2012   Renal US 02/2014 - multiple kidney cysts    CVA (cerebral infarction) 03/07/2012   slurred speech; right hemplegia, left handed - completed PT 07/2014 (17 sessions)   Dehydration    Depression    mild, on cymbalta per prior PCP   Erectile dysfunction    per urology (prostaglandin injection)   GERD (gastroesophageal reflux disease)    with sliding HH   H/O hiatal hernia    History of kidney problems 12/2010   lacerated left kidney after fall   HLD (hyperlipidemia)    Hx of basal cell carcinoma 07/10/2017   R. lat. forehead near hair line   Kidney cysts 02/2014   bilateral (renal US)   Migraines 03/11/2012   now rare   OSA on CPAP    sleep study 01/2014 - severe, CPAP at 10, previously on nuvigil (Dr. [name])   Palpitations 01/2012   holter WNL, rare PAC/PVCs   Presbycusis of both ears 2016   rec amplification [facility])   Restless leg syndrome    well controlled on mirapex   Rosacea    [name]   Stroke [facility])    TIA (transient ischemic attack)    Type 2 diabetes, controlled, with neuropathy ([place]) 2011   [name] in [facility]  Past Surgical History:  Procedure Laterality Date   ABI  2007   WNL   CARDIAC CATHETERIZATION  2004   normal; [facility]   CARDIOVASCULAR STRESS TEST  02/2014   WNL, low risk scan, EF 68%   CAROTID ENDARTERECTOMY     CATARACT EXTRACTION W/ INTRAOCULAR LENS IMPLANT  11//2012 - 08/2011   COLONOSCOPY  03/2011   poor prep, unable to biopsy polyp in tic fold, rpt 3 mo [name])   COLONOSCOPY  06/2011   mult polyps adenomatous, severe diverticulosis, rpt 3 yrs [name])   CYSTOSCOPY WITH URETHRAL DILATATION N/A 07/24/2013   Procedure: CYSTOSCOPY WITH URETHRAL DILATATION;  Surgeon: [name], MD;  [facility];  Service: Urology;  Laterality: N/A;   ESOPHAGOGASTRODUODENOSCOPY  2006   duodenitis, medual HH, Grade 2 reflux, mild gastritis    ESOPHAGOGASTRODUODENOSCOPY  07/2011   mild chronic gastritis   HEMORRHOID SURGERY  1991   INGUINAL HERNIA REPAIR  1981; 2002   left, then repaired   MRI brain  04/2010   WNL   PROSTATE SURGERY  02/2009   PVP (laser)   SKIN CANCER EXCISION  2002   bridge of nose, BCC   TONSILLECTOMY AND ADENOIDECTOMY  ~ [place] N/A 07/24/2013   Procedure: TRANSURETHRAL INCISION OF THE PROSTATE (TUIP);  Surgeon: [name], MD;  [facility];  Service: Urology;  Laterality: N/A;   URETHRAL DILATION  2014   tann[place]baum     Current Outpatient Medications  Medication Sig Dispense Refill   Alpha-Lipoic Acid 600 MG CAPS Take 1 capsule (600 mg total) by mouth daily. 30 capsule 11   brimonidine-timolol (COMBIGAN) 0.2-0.5 % ophthalmic solution Place 1 drop into both eyes every 12 (twelve) hours.     clopidogrel (PLAVIX) 75 MG tablet TAKE 1 TABLET DAILY 90 tablet 1   Coenzyme Q10 (COQ10) 150 MG CAPS Take 1 capsule by mouth daily.     DULoxetine (CYMBALTA) 60 MG capsule Take 1 capsule (60 mg total) by mouth daily. 90 capsule 3   famotidine (PEPCID) 20 MG tablet Take 1 tablet (20 mg total) by mouth at bedtime.     fluconazole (DIFLUCAN) 150 MG tablet Take 1 tablet (150 mg total) by mouth once a week. 2 tablet 0   fluticasone (FLOVENT HFA) 110 MCG/ACT inhaler Inhale 1 puff into the lungs in the morning and at bedtime. 1 each 1   gabapentin (NEURONTIN) 300 MG capsule Take 2 capsules (600 mg total) by mouth 3 (three) times daily. 600 mg BID is his minimum dose without having breakthrough symptoms. He is aware 900 mg/day is recommended per kidney function.     insulin aspart (NOVOLOG) 100 UNIT/ML injection CBG 70 - 120: 0 units  CBG 121 - 150: 1 unit  CBG 151 - 200: 2 units  CBG 201 - 250: 3 units  CBG 251 - 300: 5 units  CBG 301 - 350: 7 units  CBG 351 - 400: 9 units 10 mL 11   insulin degludec (TRESIBA FLEXTOUCH) 200 UNIT/ML FlexTouch Pen Inject 90 Units into the skin  at bedtime.     Insulin Pen Needle (ULTICARE MICRO PEN NEEDLES) 31G X 8 MM MISC Use to inject medication daily 100 each 3   levothyroxine (SYNTHROID) 50 MCG tablet Take 1 tablet (50 mcg total) by mouth daily before breakfast. Per endo [name]     melatonin 5 MG TABS Take 5 mg by mouth at bedtime.  MELATONIN PO Take by mouth at bedtime.     mupirocin ointment (BACTROBAN) 2 % Place 1 application. into the nose 2 (two) times daily. 22 g 0   nitroGLYCERIN (NITROSTAT) 0.4 MG SL tablet DISSOLVE 1 TABLET UNDER THE TONGUE EVERY 5 MINUTES AS NEEDED FOR CHEST PAIN 25 tablet 1   ondansetron (ZOFRAN) 4 MG tablet Take 4 mg by mouth daily as needed for nausea or vomiting.     polyethylene glycol (MIRALAX / GLYCOLAX) 17 g packet Take 17 g by mouth daily as needed. 14 each 0   pramipexole (MIRAPEX) 1 MG tablet Take 1 tablet (1 mg total) by mouth daily. At 4pm 90 tablet 3   RABEprazole (ACIPHEX) 20 MG tablet Take 1 tablet (20 mg total) by mouth daily. 90 tablet 3   rosuvastatin (CRESTOR) 40 MG tablet Take 40 mg by mouth daily.     Semaglutide, 1 MG/DOSE, (OZEMPIC, 1 MG/DOSE,) 4 MG/3ML SOPN Inject 1 mg into the skin once a week.     sennosides-docusate sodium (SENOKOT-S) 8.6-50 MG tablet Take 1 tablet by mouth daily.     sodium chloride (MURO 128) 5 % ophthalmic solution 1 drop as needed for eye irritation.     tamsulosin (FLOMAX) 0.4 MG CAPS capsule TAKE 1 CAPSULE DAILY AFTER BREAKFAST 90 capsule 3   vitamin C (ASCORBIC ACID) 500 MG tablet Take 1,000 mg by mouth daily as needed.     No current facility-administered medications for this visit.    Allergies:   Dulaglutide, Empagliflozin, Lisinopril, Nexium [esomeprazole], Oxycodone, Pregabalin, and Rosuvastatin    Social History:  The patient  reports that he has never smoked. He has never used smokeless tobacco. He reports current alcohol use of about 4.0 standard drinks of alcohol per week. He reports that he does not use drugs.   Family History:  The  patient's family history includes Alcoholism in his sister; Diabetes (age of onset: 28) in his brother; Kidney disease in his sister; Lung cancer in his brother; Lung cancer (age of onset: 62) in his father; Stroke in his brother and father.    ROS:  Please see the history of present illness.   Otherwise, review of systems are positive for none.   All other systems are reviewed and negative.    PHYSICAL EXAM: VS:  BP (!) 140/60 (BP Location: Left Arm, Patient Position: Sitting, Cuff Size: Normal)   Pulse 77   Ht '5\' 8"'$  (1.727 m)   Wt 195 lb 8 oz (88.7 kg)   SpO2 96%   BMI 29.73 kg/m  , BMI Body mass index is 29.73 kg/m. GEN: Well nourished, well developed, in no acute distress  HEENT: normal  Neck: no JVD, carotid bruits, or masses Cardiac: RRR; no murmurs, rubs, or gallops,no edema  Respiratory:  clear to auscultation bilaterally, normal work of breathing GI: soft, nontender, nondistended, + BS MS: no deformity or atrophy  Skin: warm and dry, no rash Neuro:  Strength and sensation are intact Psych: euthymic mood, full affect   EKG:  EKG is ordered today. EKG showed normal sinus rhythm with left axis deviation and incomplete left bundle branch block.   Recent Labs: 01/01/2022: B Natriuretic Peptide 180.8; TSH 2.546 01/02/2022: ALT 18; Magnesium 2.7 01/03/2022: BUN 34; Creatinine, Ser 1.41; Hemoglobin 12.8; Platelets 148; Potassium 4.6; Sodium 136    Lipid Panel    Component Value Date/Time   CHOL 105 06/13/2021 1528   TRIG 209.0 (H) 06/13/2021 1528   HDL 18.50 (L) 06/13/2021  1528   CHOLHDL 6 06/13/2021 1528   VLDL 41.8 (H) 06/13/2021 1528   LDLCALC 53 12/05/2019 0000   LDLDIRECT 57.0 06/13/2021 1528      Wt Readings from Last 3 Encounters:  07/20/22 195 lb 8 oz (88.7 kg)  05/09/22 192 lb 3.2 oz (87.2 kg)  04/24/22 189 lb 2 oz (85.8 kg)            No data to display            ASSESSMENT AND PLAN:  1.  Bradycardia and intermittent Mobitz 2  second-degree AV block: Outpatient monitor showed no significant AV block and no significant bradycardia.  He currently has no symptoms of bradycardia arrhythmia and EKG shows sinus rhythm with a heart rate of 77 bpm.    2.  History of CVA: On long-term treatment with clopidogrel.   Disposition:   FU with me in as needed for any new cardiac issues. Signed,  Kathlyn Sacramento, MD  07/20/2022 10:59 AM    [place]

## 2022-07-21 DIAGNOSIS — R2689 Other abnormalities of gait and mobility: Secondary | ICD-10-CM | POA: Diagnosis not present

## 2022-07-21 DIAGNOSIS — Z9181 History of falling: Secondary | ICD-10-CM | POA: Diagnosis not present

## 2022-07-21 DIAGNOSIS — R2681 Unsteadiness on feet: Secondary | ICD-10-CM | POA: Diagnosis not present

## 2022-07-22 DIAGNOSIS — E1165 Type 2 diabetes mellitus with hyperglycemia: Secondary | ICD-10-CM | POA: Diagnosis not present

## 2022-07-24 DIAGNOSIS — Z9181 History of falling: Secondary | ICD-10-CM | POA: Diagnosis not present

## 2022-07-24 DIAGNOSIS — R2689 Other abnormalities of gait and mobility: Secondary | ICD-10-CM | POA: Diagnosis not present

## 2022-07-24 DIAGNOSIS — R2681 Unsteadiness on feet: Secondary | ICD-10-CM | POA: Diagnosis not present

## 2022-07-25 DIAGNOSIS — M6281 Muscle weakness (generalized): Secondary | ICD-10-CM | POA: Diagnosis not present

## 2022-07-26 DIAGNOSIS — R2681 Unsteadiness on feet: Secondary | ICD-10-CM | POA: Diagnosis not present

## 2022-07-26 DIAGNOSIS — R2689 Other abnormalities of gait and mobility: Secondary | ICD-10-CM | POA: Diagnosis not present

## 2022-07-26 DIAGNOSIS — Z9181 History of falling: Secondary | ICD-10-CM | POA: Diagnosis not present

## 2022-07-27 DIAGNOSIS — R2689 Other abnormalities of gait and mobility: Secondary | ICD-10-CM | POA: Diagnosis not present

## 2022-07-27 DIAGNOSIS — M6281 Muscle weakness (generalized): Secondary | ICD-10-CM | POA: Diagnosis not present

## 2022-07-27 DIAGNOSIS — Z9181 History of falling: Secondary | ICD-10-CM | POA: Diagnosis not present

## 2022-07-27 DIAGNOSIS — R2681 Unsteadiness on feet: Secondary | ICD-10-CM | POA: Diagnosis not present

## 2022-07-31 ENCOUNTER — Encounter: Payer: Self-pay | Admitting: Family Medicine

## 2022-07-31 ENCOUNTER — Ambulatory Visit (INDEPENDENT_AMBULATORY_CARE_PROVIDER_SITE_OTHER): Payer: Medicare PPO | Admitting: Family Medicine

## 2022-07-31 VITALS — BP 130/68 | HR 81 | Temp 97.3°F | Ht 66.75 in | Wt 192.8 lb

## 2022-07-31 DIAGNOSIS — I69398 Other sequelae of cerebral infarction: Secondary | ICD-10-CM

## 2022-07-31 DIAGNOSIS — E785 Hyperlipidemia, unspecified: Secondary | ICD-10-CM | POA: Diagnosis not present

## 2022-07-31 DIAGNOSIS — N1831 Chronic kidney disease, stage 3a: Secondary | ICD-10-CM | POA: Diagnosis not present

## 2022-07-31 DIAGNOSIS — Z794 Long term (current) use of insulin: Secondary | ICD-10-CM

## 2022-07-31 DIAGNOSIS — I10 Essential (primary) hypertension: Secondary | ICD-10-CM

## 2022-07-31 DIAGNOSIS — F331 Major depressive disorder, recurrent, moderate: Secondary | ICD-10-CM

## 2022-07-31 DIAGNOSIS — G2581 Restless legs syndrome: Secondary | ICD-10-CM

## 2022-07-31 DIAGNOSIS — E1165 Type 2 diabetes mellitus with hyperglycemia: Secondary | ICD-10-CM

## 2022-07-31 DIAGNOSIS — G8110 Spastic hemiplegia affecting unspecified side: Secondary | ICD-10-CM

## 2022-07-31 DIAGNOSIS — R2681 Unsteadiness on feet: Secondary | ICD-10-CM | POA: Diagnosis not present

## 2022-07-31 DIAGNOSIS — N1832 Chronic kidney disease, stage 3b: Secondary | ICD-10-CM

## 2022-07-31 DIAGNOSIS — Z7409 Other reduced mobility: Secondary | ICD-10-CM

## 2022-07-31 DIAGNOSIS — E1122 Type 2 diabetes mellitus with diabetic chronic kidney disease: Secondary | ICD-10-CM

## 2022-07-31 DIAGNOSIS — K219 Gastro-esophageal reflux disease without esophagitis: Secondary | ICD-10-CM

## 2022-07-31 DIAGNOSIS — N138 Other obstructive and reflux uropathy: Secondary | ICD-10-CM

## 2022-07-31 DIAGNOSIS — E1169 Type 2 diabetes mellitus with other specified complication: Secondary | ICD-10-CM | POA: Diagnosis not present

## 2022-07-31 DIAGNOSIS — Z9181 History of falling: Secondary | ICD-10-CM | POA: Diagnosis not present

## 2022-07-31 DIAGNOSIS — R2689 Other abnormalities of gait and mobility: Secondary | ICD-10-CM | POA: Diagnosis not present

## 2022-07-31 DIAGNOSIS — G4733 Obstructive sleep apnea (adult) (pediatric): Secondary | ICD-10-CM

## 2022-07-31 DIAGNOSIS — Z8673 Personal history of transient ischemic attack (TIA), and cerebral infarction without residual deficits: Secondary | ICD-10-CM

## 2022-07-31 DIAGNOSIS — Z0000 Encounter for general adult medical examination without abnormal findings: Secondary | ICD-10-CM | POA: Diagnosis not present

## 2022-07-31 DIAGNOSIS — I69351 Hemiplegia and hemiparesis following cerebral infarction affecting right dominant side: Secondary | ICD-10-CM

## 2022-07-31 DIAGNOSIS — R413 Other amnesia: Secondary | ICD-10-CM

## 2022-07-31 MED ORDER — GABAPENTIN 300 MG PO CAPS: 600.0000 mg | ORAL_CAPSULE | Freq: Three times a day (TID) | ORAL | 1 refills | Status: DC

## 2022-07-31 NOTE — Assessment & Plan Note (Signed)

## 2022-07-31 NOTE — Patient Instructions (Addendum)
If interested, check with pharmacy about 2 shot shingles series (shingrix).  Labs today.  Schedule follow up with Dr Jeraldine Loots neurology for RLS.  Limit gabapentin to '600mg'$  three times a day, ideally no more than '900mg'$ /day.  Good to see you today.  Return 3 months for memory evaluation.   Health Maintenance After Age 86 After age 40, you are at a higher risk for certain long-term diseases and infections as well as injuries from falls. Falls are a major cause of broken bones and head injuries in people who are older than age 34. Getting regular preventive care can help to keep you healthy and well. Preventive care includes getting regular testing and making lifestyle changes as recommended by your health care provider. Talk with your health care provider about: Which screenings and tests you should have. A screening is a test that checks for a disease when you have no symptoms. A diet and exercise plan that is right for you. What should I know about screenings and tests to prevent falls? Screening and testing are the best ways to find a health problem early. Early diagnosis and treatment give you the best chance of managing medical conditions that are common after age 42. Certain conditions and lifestyle choices may make you more likely to have a fall. Your health care provider may recommend: Regular vision checks. Poor vision and conditions such as cataracts can make you more likely to have a fall. If you wear glasses, make sure to get your prescription updated if your vision changes. Medicine review. Work with your health care provider to regularly review all of the medicines you are taking, including over-the-counter medicines. Ask your health care provider about any side effects that may make you more likely to have a fall. Tell your health care provider if any medicines that you take make you feel dizzy or sleepy. Strength and balance checks. Your health care provider may recommend certain tests to  check your strength and balance while standing, walking, or changing positions. Foot health exam. Foot pain and numbness, as well as not wearing proper footwear, can make you more likely to have a fall. Screenings, including: Osteoporosis screening. Osteoporosis is a condition that causes the bones to get weaker and break more easily. Blood pressure screening. Blood pressure changes and medicines to control blood pressure can make you feel dizzy. Depression screening. You may be more likely to have a fall if you have a fear of falling, feel depressed, or feel unable to do activities that you used to do. Alcohol use screening. Using too much alcohol can affect your balance and may make you more likely to have a fall. Follow these instructions at home: Lifestyle Do not drink alcohol if: Your health care provider tells you not to drink. If you drink alcohol: Limit how much you have to: 0-1 drink a day for women. 0-2 drinks a day for men. Know how much alcohol is in your drink. In the [place] one drink equals one 12 oz bottle of beer (355 mL), one 5 oz glass of wine (148 mL), or one 1 oz glass of hard liquor (44 mL). Do not use any products that contain nicotine or tobacco. These products include cigarettes, chewing tobacco, and vaping devices, such as e-cigarettes. If you need help quitting, ask your health care provider. Activity  Follow a regular exercise program to stay fit. This will help you maintain your balance. Ask your health care provider what types of exercise are appropriate for you.  If you need a cane or walker, use it as recommended by your health care provider. Wear supportive shoes that have nonskid soles. Safety  Remove any tripping hazards, such as rugs, cords, and clutter. Install safety equipment such as grab bars in bathrooms and safety rails on stairs. Keep rooms and walkways well-lit. General instructions Talk with your health care provider about your risks for falling.  Tell your health care provider if: You fall. Be sure to tell your health care provider about all falls, even ones that seem minor. You feel dizzy, tiredness (fatigue), or off-balance. Take over-the-counter and prescription medicines only as told by your health care provider. These include supplements. Eat a healthy diet and maintain a healthy weight. A healthy diet includes low-fat dairy products, low-fat (lean) meats, and fiber from whole grains, beans, and lots of fruits and vegetables. Stay current with your vaccines. Schedule regular health, dental, and eye exams. Summary Having a healthy lifestyle and getting preventive care can help to protect your health and wellness after age 62. Screening and testing are the best way to find a health problem early and help you avoid having a fall. Early diagnosis and treatment give you the best chance for managing medical conditions that are more common for people who are older than age 85. Falls are a major cause of broken bones and head injuries in people who are older than age 38. Take precautions to prevent a fall at home. Work with your health care provider to learn what changes you can make to improve your health and wellness and to prevent falls. This information is not intended to replace advice given to you by your health care provider. Make sure you discuss any questions you have with your health care provider. Document Revised: 01/24/2021 Document Reviewed: 01/24/2021 Elsevier Patient Education  [place].

## 2022-07-31 NOTE — Progress Notes (Unsigned)
Patient ID: James Spurr, MD, male    DOB: 11-03-1930, 88 y.o.   MRN: [id number]  This visit was conducted in person.  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 )   Did not speak with health advisor.   Hearing Screening - Comments:: Wears bilateral hearing aids.  Wearing at today's OV.  Vision Screening - Comments:: Last eye exam, 06/2022.  [place] Office Visit from 05/09/2022 in [facility]  PHQ-2 Total Score 0          07/31/2022    2:28 PM 05/09/2022   11:06 AM 07/22/2021   11:04 AM 06/13/2021    2:43 PM 06/01/2020   11:31 AM  Fall Risk   Falls in the past year? '1  1 1 1  '$ Number falls in past yr: 1 1 0 1 1  Comment     4x  Injury with Fall? 0 0 0 0 0  3-4 falls this past year, latest Spring 2023, s/p outpatient at [facility] without recurrence. Changed insurance plan - back doing outpatient PT with benefit.   Continues living in [place] in [place].  H/o L subcortical infarct with resultant chronic R hemiparesis 2013.   Notes some memory difficulty as well as easily falling asleep at inopportune times.   Known bradycardia with intermittent mobitz type II 2nd degree AVB - has seen cardiology Dr Fletcher Anon.   Severe RLS - saw neurology Dr Jeraldine Loots, s/p iron infusion through [facility] 2021. Continues mirapex '1mg'$  nightly. Recommended gabapentin max dose of '700mg'$  bid. He's taking '600mg'$  QID!, states lower doses cause breakthrough severe RLS symptoms.   DM - has been using [employer] 2 with significant benefit. Followed by Dr Louanna Raw endo. Continues Tresiba 90u daily with Novolog aspart SSI, as well as ozempic '1mg'$  weekly.   Preventative: COLONOSCOPY Date: 06/2011  mult adenomatous polyps, severe diverticulosis, rpt 3 yrs [name]). Pt has decided to defer. No noted blood in stool. Oc diarrhea.  Prostate - previously followed by urology  Lung cancer screening - not eligible  Flu shot today  [place] 09/2019, 10/2019, booster 06/2020  Pneumovax - 09/2007, 09/2008. Prevnar-13 2015  Td - 06/2016, Tdap 05/2018  zostavax - 09/2007  Shingrix - discussed, to check at pharmacy  Advanced directives: Has advanced directives at home. Will bring copy for chart. Daughter [name] is HCPOA.  Seat belt use discussed  Sunscreen use discussed. No changing moles on skin. Sees derm [name].  Non smoker  Alcohol - none  Dentist - q6 mo  Eye exam - q6 mo, monitoring glaucoma  Trouble with IADLs 2/2 stroke.  Bowel - no constipation  Bladder - no incontinence   Caffeine: 1 cup coffee/day   Occupation: ophthalmologist, retired Edu: MD  Activity: going to gym 3x/wk with trainer  Diet: some water, fruits/vegetables some, red meat a few times, fish 1x/wk POA - [name] (Daughter)      Relevant past medical, surgical, family and social history reviewed and updated as indicated. Interim medical history since our last visit reviewed. Allergies and medications reviewed and updated. Outpatient  Medications Prior to Visit  Medication Sig Dispense Refill   Alpha-Lipoic Acid 600 MG CAPS Take 1 capsule (600 mg total) by mouth daily. 30 capsule 11   brimonidine-timolol (COMBIGAN) 0.2-0.5 % ophthalmic solution Place 1 drop into both eyes every 12 (twelve) hours.     clopidogrel (PLAVIX) 75 MG tablet TAKE 1 TABLET DAILY 90 tablet 1   Coenzyme Q10 (COQ10) 150 MG CAPS Take 1 capsule by mouth daily.     DULoxetine (CYMBALTA) 60 MG capsule Take 1 capsule (60 mg total) by mouth daily. 90 capsule 3   famotidine (PEPCID) 20 MG tablet Take 1 tablet (20 mg total) by mouth at bedtime.     fluconazole (DIFLUCAN) 150 MG tablet Take 1 tablet (150 mg total) by mouth once a  week. 2 tablet 0   fluticasone (FLOVENT HFA) 110 MCG/ACT inhaler Inhale 1 puff into the lungs in the morning and at bedtime. 1 each 1   insulin aspart (NOVOLOG) 100 UNIT/ML injection CBG 70 - 120: 0 units  CBG 121 - 150: 1 unit  CBG 151 - 200: 2 units  CBG 201 - 250: 3 units  CBG 251 - 300: 5 units  CBG 301 - 350: 7 units  CBG 351 - 400: 9 units 10 mL 11   insulin degludec (TRESIBA FLEXTOUCH) 200 UNIT/ML FlexTouch Pen Inject 90 Units into the skin at bedtime.     Insulin Pen Needle (ULTICARE MICRO PEN NEEDLES) 31G X 8 MM MISC Use to inject medication daily 100 each 3   levothyroxine (SYNTHROID) 50 MCG tablet Take 1 tablet (50 mcg total) by mouth daily before breakfast. Per endo [name]     melatonin 5 MG TABS Take 5 mg by mouth at bedtime.     MELATONIN PO Take by mouth at bedtime.     mupirocin ointment (BACTROBAN) 2 % Place 1 application. into the nose 2 (two) times daily. 22 g 0   nitroGLYCERIN (NITROSTAT) 0.4 MG SL tablet DISSOLVE 1 TABLET UNDER THE TONGUE EVERY 5 MINUTES AS NEEDED FOR CHEST PAIN 25 tablet 1   ondansetron (ZOFRAN) 4 MG tablet Take 4 mg by mouth daily as needed for nausea or vomiting.     polyethylene glycol (MIRALAX / GLYCOLAX) 17 g packet Take 17 g by mouth daily as needed. 14 each 0   pramipexole (MIRAPEX) 1 MG tablet Take 1 tablet (1 mg total) by mouth daily. At 4pm 90 tablet 3   RABEprazole (ACIPHEX) 20 MG tablet Take 1 tablet (20 mg total) by mouth daily. 90 tablet 3   rosuvastatin (CRESTOR) 40 MG tablet Take 40 mg by mouth daily.     Semaglutide, 1 MG/DOSE, (OZEMPIC, 1 MG/DOSE,) 4 MG/3ML SOPN Inject 1 mg into the skin once a week.     sennosides-docusate sodium (SENOKOT-S) 8.6-50 MG tablet Take 1 tablet by mouth daily.     sodium chloride (MURO 128) 5 % ophthalmic solution 1 drop as needed for eye irritation.     tamsulosin (FLOMAX) 0.4 MG CAPS capsule TAKE 1 CAPSULE DAILY AFTER BREAKFAST 90 capsule 3   vitamin C (ASCORBIC ACID) 500 MG tablet Take 1,000 mg by mouth  daily as needed.     gabapentin (NEURONTIN) 300 MG capsule Take 2 capsules (600 mg total) by mouth 3 (three) times daily. 600 mg BID is his minimum dose without having breakthrough symptoms. He is aware 900 mg/day is recommended per kidney function.     No facility-administered medications prior to visit.  Per HPI unless specifically indicated in ROS section below Review of Systems  Constitutional:  Negative for activity change, appetite change, chills, fatigue, fever and unexpected weight change.  HENT:  Negative for hearing loss.   Eyes:  Negative for visual disturbance.  Respiratory:  Negative for cough, chest tightness, shortness of breath and wheezing.   Cardiovascular:  Positive for leg swelling (wears compression). Negative for chest pain and palpitations.  Gastrointestinal:  Positive for diarrhea (occ). Negative for abdominal distention, abdominal pain, blood in stool, constipation, nausea and vomiting.  Genitourinary:  Negative for difficulty urinating and hematuria.  Musculoskeletal:  Negative for arthralgias, myalgias and neck pain.  Skin:  Negative for rash.  Neurological:  Negative for dizziness, seizures, syncope and headaches.  Hematological:  Negative for adenopathy. Does not bruise/bleed easily.  Psychiatric/Behavioral:  Positive for dysphoric mood. The patient is not nervous/anxious.     Objective:  BP 130/68   Pulse 81   Temp (!) 97.3 F (36.3 C) (Temporal)   Ht 5' 6.75" (1.695 m)   Wt 192 lb 12.8 oz (87.5 kg)   SpO2 97%   BMI 30.42 kg/m   Wt Readings from Last 3 Encounters:  07/31/22 192 lb 12.8 oz (87.5 kg)  07/20/22 195 lb 8 oz (88.7 kg)  05/09/22 192 lb 3.2 oz (87.2 kg)      Physical Exam Vitals and nursing note reviewed.  Constitutional:      General: He is not in acute distress.    Appearance: Normal appearance. He is well-developed. He is not ill-appearing.     Comments: Ambulates with rollator  HENT:     Head: Normocephalic and atraumatic.      Right Ear: Hearing, tympanic membrane, ear canal and external ear normal.     Left Ear: Hearing, tympanic membrane, ear canal and external ear normal.     Nose: Nose normal.     Mouth/Throat:     Mouth: Mucous membranes are moist.     Pharynx: Oropharynx is clear. No oropharyngeal exudate or posterior oropharyngeal erythema.  Eyes:     General: No scleral icterus.    Extraocular Movements: Extraocular movements intact.     Conjunctiva/sclera: Conjunctivae normal.     Pupils: Pupils are equal, round, and reactive to light.  Neck:     Thyroid: No thyroid mass or thyromegaly.     Vascular: No carotid bruit.  Cardiovascular:     Rate and Rhythm: Normal rate and regular rhythm.     Pulses: Normal pulses.          Radial pulses are 2+ on the right side and 2+ on the left side.     Heart sounds: Normal heart sounds. No murmur heard. Pulmonary:     Effort: Pulmonary effort is normal. No respiratory distress.     Breath sounds: Normal breath sounds. No wheezing, rhonchi or rales.  Abdominal:     General: Bowel sounds are normal. There is no distension.     Palpations: Abdomen is soft. There is no mass.     Tenderness: There is no abdominal tenderness. There is no guarding or rebound.     Hernia: No hernia is present.  Musculoskeletal:        General: Normal range of motion.     Cervical back: Normal range of motion and neck supple.     Right lower leg: No edema.     Left lower leg: No edema.  Lymphadenopathy:     Cervical: No cervical adenopathy.  Skin:    General: Skin is warm and dry.     Findings: No rash.  Neurological:     General: No focal deficit present.     Mental Status: He is alert and oriented to person, place, and time.     Comments:  Recall 2/3, 3/3 with cue  calculation 4/5 DLOW   Psychiatric:        Mood and Affect: Mood normal.        Behavior: Behavior normal.        Thought Content: Thought content normal.        Judgment: Judgment normal.       Results  for orders placed or performed in visit on 04/24/22  POCT Urinalysis Dipstick (Automated)  Result Value Ref Range   Color, UA yellow    Clarity, UA clear    Glucose, UA Negative Negative   Bilirubin, UA negative    Ketones, UA negative    Spec Grav, UA 1.015 1.010 - 1.025   Blood, UA negative    pH, UA 5.5 5.0 - 8.0   Protein, UA Positive (A) Negative   Urobilinogen, UA 0.2 0.2 or 1.0 E.U./dL   Nitrite, UA negative    Leukocytes, UA Negative Negative   *Note: Due to a large number of results and/or encounters for the requested time period, some results have not been displayed. A complete set of results can be found in Results Review.    Assessment & Plan:   Problem List Items Addressed This Visit     Medicare annual wellness visit, subsequent - Primary (Chronic)    I have personally reviewed the Medicare Annual Wellness questionnaire and have noted 1. The patient's medical and social history 2. Their use of alcohol, tobacco or illicit drugs 3. Their current medications and supplements 4. The patient's functional ability including ADL's, fall risks, home safety risks and hearing or visual impairment. Cognitive function has been assessed and addressed as indicated.  5. Diet and physical activity 6. Evidence for depression or mood disorders The patients weight, height, BMI have been recorded in the chart. I have made referrals, counseling and provided education to the patient based on review of the above and I have provided the pt with a written personalized care plan for preventive services. Provider list updated.. See scanned questionairre as needed for further documentation. Reviewed preventative protocols and updated unless pt declined.       Healthcare maintenance (Chronic)    Preventative protocols reviewed and updated unless pt declined. Discussed healthy diet and lifestyle.       Type 2 diabetes mellitus with hyperglycemia (HCC)   Relevant Orders   Hemoglobin A1c    Microalbumin / creatinine urine ratio   Chronic kidney disease, stage 3a (HCC)   Relevant Orders   Comprehensive metabolic panel   VITAMIN D 25 Hydroxy (Vit-D Deficiency, Fractures)   Microalbumin / creatinine urine ratio   CBC with Differential/Platelet   Parathyroid hormone, intact (no Ca)   Hyperlipidemia associated with type 2 diabetes mellitus (HCC)   Relevant Orders   Lipid panel   Comprehensive metabolic panel   CK   Restless leg syndrome, severe   Relevant Orders   Ferritin   IBC panel   TSH   Hypomagnesemia   Relevant Orders   Magnesium   Memory deficit     Meds ordered this encounter  Medications   gabapentin (NEURONTIN) 300 MG capsule    Sig: Take 2 capsules (600 mg total) by mouth  3 (three) times daily. 600 mg BID is his minimum dose without having breakthrough symptoms. He is aware 900 mg/day is recommended per kidney function.    Dispense:  360 capsule    Refill:  1   Orders Placed This Encounter  Procedures   Lipid panel   Comprehensive metabolic panel   VITAMIN D 25 Hydroxy (Vit-D Deficiency, Fractures)   Hemoglobin A1c   Microalbumin / creatinine urine ratio   CBC with Differential/Platelet   Parathyroid hormone, intact (no Ca)   Magnesium   Ferritin   IBC panel   TSH   CK    Patient instructions: If interested, check with pharmacy about 2 shot shingles series (shingrix).  Labs today.  Schedule follow up with Dr Jeraldine Loots neurology for RLS.  Limit gabapentin to '600mg'$  three times a day, ideally no more than '900mg'$ /day.  Good to see you today.  Return 3 months for memory evaluation.  Follow up plan: Return in about 3 months (around 10/31/2022), or if symptoms worsen or fail to improve, for follow up visit.  [name], MD

## 2022-07-31 NOTE — Assessment & Plan Note (Addendum)
Preventative protocols reviewed and updated unless pt declined. Discussed healthy diet and lifestyle.  

## 2022-08-01 LAB — PARATHYROID HORMONE, INTACT (NO CA): PTH: 42 pg/mL (ref 16–77)

## 2022-08-01 LAB — LDL CHOLESTEROL, DIRECT: Direct LDL: 126 mg/dL

## 2022-08-01 LAB — COMPREHENSIVE METABOLIC PANEL
ALT: 13 U/L (ref 0–53)
AST: 18 U/L (ref 0–37)
Albumin: 3.8 g/dL (ref 3.5–5.2)
Alkaline Phosphatase: 45 U/L (ref 39–117)
BUN: 21 mg/dL (ref 6–23)
CO2: 28 mEq/L (ref 19–32)
Calcium: 8.6 mg/dL (ref 8.4–10.5)
Chloride: 99 mEq/L (ref 96–112)
Creatinine, Ser: 1.33 mg/dL (ref 0.40–1.50)
GFR: 47.53 mL/min — ABNORMAL LOW (ref >60.00)
Glucose, Bld: 186 mg/dL — ABNORMAL HIGH (ref 70–99)
Potassium: 4.3 mEq/L (ref 3.5–5.1)
Sodium: 133 mEq/L — ABNORMAL LOW (ref 135–145)
Total Bilirubin: 0.4 mg/dL (ref 0.2–1.2)
Total Protein: 6.8 g/dL (ref 6.0–8.3)

## 2022-08-01 LAB — MICROALBUMIN / CREATININE URINE RATIO
Creatinine,U: 75.3 mg/dL
Microalb Creat Ratio: 27.4 mg/g (ref 0.0–30.0)
Microalb, Ur: 20.6 mg/dL — ABNORMAL HIGH (ref 0.0–1.9)

## 2022-08-01 LAB — CBC WITH DIFFERENTIAL/PLATELET
Basophils Absolute: 0.1 10*3/uL (ref 0.0–0.1)
Basophils Relative: 0.8 % (ref 0.0–3.0)
Eosinophils Absolute: 0.4 10*3/uL (ref 0.0–0.7)
Eosinophils Relative: 5.4 % — ABNORMAL HIGH (ref 0.0–5.0)
HCT: 40.4 % (ref 39.0–52.0)
Hemoglobin: 13.7 g/dL (ref 13.0–17.0)
Lymphocytes Relative: 25.4 % (ref 12.0–46.0)
Lymphs Abs: 1.9 10*3/uL (ref 0.7–4.0)
MCHC: 33.8 g/dL (ref 30.0–36.0)
MCV: 90.9 fl (ref 78.0–100.0)
Monocytes Absolute: 0.6 10*3/uL (ref 0.1–1.0)
Monocytes Relative: 8.4 % (ref 3.0–12.0)
Neutro Abs: 4.5 10*3/uL (ref 1.4–7.7)
Neutrophils Relative %: 60 % (ref 43.0–77.0)
Platelets: 183 10*3/uL (ref 150.0–400.0)
RBC: 4.45 Mil/uL (ref 4.22–5.81)
RDW: 13.7 % (ref 11.5–15.5)
WBC: 7.5 10*3/uL (ref 4.0–10.5)

## 2022-08-01 LAB — CK: Total CK: 103 U/L (ref 7–232)

## 2022-08-01 LAB — HEMOGLOBIN A1C: Hgb A1c MFr Bld: 7.9 % — ABNORMAL HIGH (ref 4.6–6.5)

## 2022-08-01 LAB — IBC PANEL
Iron: 76 ug/dL (ref 42–165)
Saturation Ratios: 25.2 % (ref 20.0–50.0)
TIBC: 301 ug/dL (ref 250.0–450.0)
Transferrin: 215 mg/dL (ref 212.0–360.0)

## 2022-08-01 LAB — LIPID PANEL
Cholesterol: 202 mg/dL — ABNORMAL HIGH (ref 0–200)
HDL: 17.6 mg/dL — ABNORMAL LOW (ref >39.00)
Total CHOL/HDL Ratio: 11
Triglycerides: 410 mg/dL — ABNORMAL HIGH (ref 0.0–149.0)

## 2022-08-01 LAB — MAGNESIUM: Magnesium: 1.8 mg/dL (ref 1.5–2.5)

## 2022-08-01 LAB — VITAMIN D 25 HYDROXY (VIT D DEFICIENCY, FRACTURES): VITD: 48.19 ng/mL (ref 30.00–100.00)

## 2022-08-01 LAB — TSH: TSH: 2.71 u[IU]/mL (ref 0.35–5.50)

## 2022-08-01 LAB — FERRITIN: Ferritin: 96.5 ng/mL (ref 22.0–322.0)

## 2022-08-01 NOTE — Assessment & Plan Note (Signed)
Chronic, stable on current regimen - continue. 

## 2022-08-01 NOTE — Assessment & Plan Note (Signed)
He continues outpatient physical therapy with benefit. Last fall was spring 2023.

## 2022-08-01 NOTE — Assessment & Plan Note (Signed)
Continue plavix, statin.  

## 2022-08-01 NOTE — Assessment & Plan Note (Signed)
Continue crestor '40mg'$  daily. Update FLP.  The ASCVD Risk score (Arnett DK, et al., 2019) failed to calculate for the following reasons:   The 2019 ASCVD risk score is only valid for ages 43 to 49

## 2022-08-01 NOTE — Assessment & Plan Note (Signed)
Continues duloxetine '60mg'$  daily.

## 2022-08-01 NOTE — Assessment & Plan Note (Signed)
He is on nightly CPAP

## 2022-08-01 NOTE — Assessment & Plan Note (Signed)
He continues tamsulosin.

## 2022-08-01 NOTE — Assessment & Plan Note (Signed)
Sees endocrinology on insulin and ozempic, using freestyle libre 2.  Appreciate endo care.

## 2022-08-01 NOTE — Assessment & Plan Note (Signed)
Stable period on aciphex '20mg'$  daily with pepcid '20mg'$  nightly.

## 2022-08-01 NOTE — Assessment & Plan Note (Signed)
Update magnesium.

## 2022-08-01 NOTE — Assessment & Plan Note (Signed)
Reviewed regimen - max gabapentin for kidney function is '900mg'$ /day. He's taking '600mg'$  QID. Advised drop at max to TID dosing.  Consider adding magnesium. Check iron panel today.

## 2022-08-01 NOTE — Assessment & Plan Note (Signed)
Endorses worsening memory difficulty.  Will return in 3 months for geriatric assessment

## 2022-08-02 DIAGNOSIS — M6281 Muscle weakness (generalized): Secondary | ICD-10-CM | POA: Diagnosis not present

## 2022-08-02 DIAGNOSIS — R2689 Other abnormalities of gait and mobility: Secondary | ICD-10-CM | POA: Diagnosis not present

## 2022-08-02 DIAGNOSIS — R2681 Unsteadiness on feet: Secondary | ICD-10-CM | POA: Diagnosis not present

## 2022-08-02 DIAGNOSIS — Z9181 History of falling: Secondary | ICD-10-CM | POA: Diagnosis not present

## 2022-08-03 ENCOUNTER — Other Ambulatory Visit: Payer: Self-pay | Admitting: Family Medicine

## 2022-08-03 DIAGNOSIS — R2689 Other abnormalities of gait and mobility: Secondary | ICD-10-CM | POA: Diagnosis not present

## 2022-08-03 DIAGNOSIS — M6281 Muscle weakness (generalized): Secondary | ICD-10-CM | POA: Diagnosis not present

## 2022-08-03 DIAGNOSIS — Z9181 History of falling: Secondary | ICD-10-CM | POA: Diagnosis not present

## 2022-08-03 DIAGNOSIS — R2681 Unsteadiness on feet: Secondary | ICD-10-CM | POA: Diagnosis not present

## 2022-08-03 MED ORDER — MAGNESIUM 250 MG PO TABS: 1.0000 | ORAL_TABLET | Freq: Every day | ORAL | 0 refills | Status: DC

## 2022-08-07 ENCOUNTER — Other Ambulatory Visit: Payer: Self-pay | Admitting: Family Medicine

## 2022-08-07 DIAGNOSIS — R2681 Unsteadiness on feet: Secondary | ICD-10-CM | POA: Diagnosis not present

## 2022-08-07 DIAGNOSIS — R2689 Other abnormalities of gait and mobility: Secondary | ICD-10-CM | POA: Diagnosis not present

## 2022-08-07 DIAGNOSIS — Z9181 History of falling: Secondary | ICD-10-CM | POA: Diagnosis not present

## 2022-08-08 ENCOUNTER — Other Ambulatory Visit: Payer: Self-pay | Admitting: Family Medicine

## 2022-08-08 DIAGNOSIS — R2689 Other abnormalities of gait and mobility: Secondary | ICD-10-CM | POA: Diagnosis not present

## 2022-08-08 DIAGNOSIS — Z9181 History of falling: Secondary | ICD-10-CM | POA: Diagnosis not present

## 2022-08-08 DIAGNOSIS — R2681 Unsteadiness on feet: Secondary | ICD-10-CM | POA: Diagnosis not present

## 2022-08-09 DIAGNOSIS — M6281 Muscle weakness (generalized): Secondary | ICD-10-CM | POA: Diagnosis not present

## 2022-08-09 DIAGNOSIS — R2689 Other abnormalities of gait and mobility: Secondary | ICD-10-CM | POA: Diagnosis not present

## 2022-08-09 DIAGNOSIS — Z9181 History of falling: Secondary | ICD-10-CM | POA: Diagnosis not present

## 2022-08-09 DIAGNOSIS — R2681 Unsteadiness on feet: Secondary | ICD-10-CM | POA: Diagnosis not present

## 2022-08-11 DIAGNOSIS — M6281 Muscle weakness (generalized): Secondary | ICD-10-CM | POA: Diagnosis not present

## 2022-08-14 DIAGNOSIS — R2689 Other abnormalities of gait and mobility: Secondary | ICD-10-CM | POA: Diagnosis not present

## 2022-08-14 DIAGNOSIS — Z9181 History of falling: Secondary | ICD-10-CM | POA: Diagnosis not present

## 2022-08-14 DIAGNOSIS — R2681 Unsteadiness on feet: Secondary | ICD-10-CM | POA: Diagnosis not present

## 2022-08-15 DIAGNOSIS — M6281 Muscle weakness (generalized): Secondary | ICD-10-CM | POA: Diagnosis not present

## 2022-08-16 DIAGNOSIS — R2681 Unsteadiness on feet: Secondary | ICD-10-CM | POA: Diagnosis not present

## 2022-08-16 DIAGNOSIS — Z9181 History of falling: Secondary | ICD-10-CM | POA: Diagnosis not present

## 2022-08-16 DIAGNOSIS — R2689 Other abnormalities of gait and mobility: Secondary | ICD-10-CM | POA: Diagnosis not present

## 2022-08-17 DIAGNOSIS — M6281 Muscle weakness (generalized): Secondary | ICD-10-CM | POA: Diagnosis not present

## 2022-08-18 DIAGNOSIS — Z9181 History of falling: Secondary | ICD-10-CM | POA: Diagnosis not present

## 2022-08-18 DIAGNOSIS — R2681 Unsteadiness on feet: Secondary | ICD-10-CM | POA: Diagnosis not present

## 2022-08-18 DIAGNOSIS — R2689 Other abnormalities of gait and mobility: Secondary | ICD-10-CM | POA: Diagnosis not present

## 2022-08-21 DIAGNOSIS — Z9181 History of falling: Secondary | ICD-10-CM | POA: Diagnosis not present

## 2022-08-21 DIAGNOSIS — E1165 Type 2 diabetes mellitus with hyperglycemia: Secondary | ICD-10-CM | POA: Diagnosis not present

## 2022-08-21 DIAGNOSIS — R2681 Unsteadiness on feet: Secondary | ICD-10-CM | POA: Diagnosis not present

## 2022-08-21 DIAGNOSIS — R2689 Other abnormalities of gait and mobility: Secondary | ICD-10-CM | POA: Diagnosis not present

## 2022-08-22 DIAGNOSIS — M6281 Muscle weakness (generalized): Secondary | ICD-10-CM | POA: Diagnosis not present

## 2022-08-23 DIAGNOSIS — Z9181 History of falling: Secondary | ICD-10-CM | POA: Diagnosis not present

## 2022-08-23 DIAGNOSIS — R2689 Other abnormalities of gait and mobility: Secondary | ICD-10-CM | POA: Diagnosis not present

## 2022-08-23 DIAGNOSIS — R2681 Unsteadiness on feet: Secondary | ICD-10-CM | POA: Diagnosis not present

## 2022-08-24 DIAGNOSIS — M6281 Muscle weakness (generalized): Secondary | ICD-10-CM | POA: Diagnosis not present

## 2022-08-25 DIAGNOSIS — R2689 Other abnormalities of gait and mobility: Secondary | ICD-10-CM | POA: Diagnosis not present

## 2022-08-25 DIAGNOSIS — Z9181 History of falling: Secondary | ICD-10-CM | POA: Diagnosis not present

## 2022-08-25 DIAGNOSIS — R2681 Unsteadiness on feet: Secondary | ICD-10-CM | POA: Diagnosis not present

## 2022-08-28 DIAGNOSIS — M6281 Muscle weakness (generalized): Secondary | ICD-10-CM | POA: Diagnosis not present

## 2022-08-28 DIAGNOSIS — Z9181 History of falling: Secondary | ICD-10-CM | POA: Diagnosis not present

## 2022-08-28 DIAGNOSIS — R2681 Unsteadiness on feet: Secondary | ICD-10-CM | POA: Diagnosis not present

## 2022-08-28 DIAGNOSIS — R2689 Other abnormalities of gait and mobility: Secondary | ICD-10-CM | POA: Diagnosis not present

## 2022-08-29 ENCOUNTER — Other Ambulatory Visit: Payer: Self-pay | Admitting: Family Medicine

## 2022-08-29 DIAGNOSIS — F331 Major depressive disorder, recurrent, moderate: Secondary | ICD-10-CM

## 2022-08-30 DIAGNOSIS — R2681 Unsteadiness on feet: Secondary | ICD-10-CM | POA: Diagnosis not present

## 2022-08-30 DIAGNOSIS — Z9181 History of falling: Secondary | ICD-10-CM | POA: Diagnosis not present

## 2022-08-30 DIAGNOSIS — M6281 Muscle weakness (generalized): Secondary | ICD-10-CM | POA: Diagnosis not present

## 2022-08-30 DIAGNOSIS — R2689 Other abnormalities of gait and mobility: Secondary | ICD-10-CM | POA: Diagnosis not present

## 2022-08-31 DIAGNOSIS — R2689 Other abnormalities of gait and mobility: Secondary | ICD-10-CM | POA: Diagnosis not present

## 2022-08-31 DIAGNOSIS — Z9181 History of falling: Secondary | ICD-10-CM | POA: Diagnosis not present

## 2022-08-31 DIAGNOSIS — R2681 Unsteadiness on feet: Secondary | ICD-10-CM | POA: Diagnosis not present

## 2022-09-04 ENCOUNTER — Other Ambulatory Visit: Payer: Self-pay | Admitting: Family Medicine

## 2022-09-04 DIAGNOSIS — R2681 Unsteadiness on feet: Secondary | ICD-10-CM | POA: Diagnosis not present

## 2022-09-04 DIAGNOSIS — R2689 Other abnormalities of gait and mobility: Secondary | ICD-10-CM | POA: Diagnosis not present

## 2022-09-04 DIAGNOSIS — Z9181 History of falling: Secondary | ICD-10-CM | POA: Diagnosis not present

## 2022-09-04 DIAGNOSIS — M6281 Muscle weakness (generalized): Secondary | ICD-10-CM | POA: Diagnosis not present

## 2022-09-05 ENCOUNTER — Telehealth: Payer: Self-pay | Admitting: Family Medicine

## 2022-09-05 DIAGNOSIS — Z9181 History of falling: Secondary | ICD-10-CM | POA: Diagnosis not present

## 2022-09-05 DIAGNOSIS — R2681 Unsteadiness on feet: Secondary | ICD-10-CM | POA: Diagnosis not present

## 2022-09-05 DIAGNOSIS — R2689 Other abnormalities of gait and mobility: Secondary | ICD-10-CM | POA: Diagnosis not present

## 2022-09-05 NOTE — Telephone Encounter (Signed)
Plavix Last filled:  06/06/22, #90 Last OV:  07/31/22, AWV Next OV:  10/31/22, 3 mo memory f/u

## 2022-09-05 NOTE — Telephone Encounter (Signed)
Patient called in to let lisa know that a fax from [facility] will send over a fax for a prescription for a cpap machine,he would like Lattie Haw to keep her eye out for it.

## 2022-09-06 DIAGNOSIS — R2681 Unsteadiness on feet: Secondary | ICD-10-CM | POA: Diagnosis not present

## 2022-09-06 DIAGNOSIS — G8190 Hemiplegia, unspecified affecting unspecified side: Secondary | ICD-10-CM | POA: Diagnosis not present

## 2022-09-06 DIAGNOSIS — Z9181 History of falling: Secondary | ICD-10-CM | POA: Diagnosis not present

## 2022-09-06 DIAGNOSIS — E1142 Type 2 diabetes mellitus with diabetic polyneuropathy: Secondary | ICD-10-CM | POA: Diagnosis not present

## 2022-09-06 DIAGNOSIS — L603 Nail dystrophy: Secondary | ICD-10-CM | POA: Diagnosis not present

## 2022-09-06 DIAGNOSIS — R2689 Other abnormalities of gait and mobility: Secondary | ICD-10-CM | POA: Diagnosis not present

## 2022-09-06 NOTE — Telephone Encounter (Signed)
Faxed to [facility] at [phone], attn: Dr. Rayvon Char, NP.

## 2022-09-06 NOTE — Telephone Encounter (Signed)
Placed order in Dr. Synthia Innocent box.

## 2022-09-06 NOTE — Telephone Encounter (Addendum)
This need to go to neurology who manages OSA, last seen 11/2021. Plz fax attn Dr Myra Rude Clabe Seal

## 2022-09-06 NOTE — Telephone Encounter (Signed)
ERx 

## 2022-09-07 ENCOUNTER — Other Ambulatory Visit: Payer: Self-pay

## 2022-09-07 ENCOUNTER — Telehealth: Payer: Self-pay | Admitting: *Deleted

## 2022-09-07 DIAGNOSIS — M6281 Muscle weakness (generalized): Secondary | ICD-10-CM | POA: Diagnosis not present

## 2022-09-07 NOTE — Telephone Encounter (Signed)
Aciphex Last rx:  12/13/21, #90 Last OV: 07/31/22, AWV Next OV:  10/31/22, 3 mo memory eval

## 2022-09-07 NOTE — Telephone Encounter (Signed)
Received fax from pts facility ([facility]) that needed to come to [name] per pt request for new autopap machine and supplies.  We last pt 12-09-2021 he was compliant.  Form in inbox.    1. OSA on CPAP   - CPAP compliance good. - Good treatment of AHI  - Encourage patient to use CPAP nightly and > 4 hours each night - F/U in 1 year or sooner if needed

## 2022-09-08 MED ORDER — RABEPRAZOLE SODIUM 20 MG PO TBEC: 20.0000 mg | DELAYED_RELEASE_TABLET | Freq: Every day | ORAL | 3 refills | Status: DC

## 2022-09-11 ENCOUNTER — Other Ambulatory Visit: Payer: Self-pay | Admitting: Family Medicine

## 2022-09-12 DIAGNOSIS — M6281 Muscle weakness (generalized): Secondary | ICD-10-CM | POA: Diagnosis not present

## 2022-09-13 DIAGNOSIS — E785 Hyperlipidemia, unspecified: Secondary | ICD-10-CM | POA: Diagnosis not present

## 2022-09-13 DIAGNOSIS — E039 Hypothyroidism, unspecified: Secondary | ICD-10-CM | POA: Diagnosis not present

## 2022-09-13 DIAGNOSIS — E1142 Type 2 diabetes mellitus with diabetic polyneuropathy: Secondary | ICD-10-CM | POA: Diagnosis not present

## 2022-09-13 DIAGNOSIS — I6930 Unspecified sequelae of cerebral infarction: Secondary | ICD-10-CM | POA: Diagnosis not present

## 2022-09-13 DIAGNOSIS — Z794 Long term (current) use of insulin: Secondary | ICD-10-CM | POA: Diagnosis not present

## 2022-09-14 ENCOUNTER — Telehealth: Payer: Self-pay | Admitting: Hospice

## 2022-09-14 DIAGNOSIS — Z515 Encounter for palliative care: Secondary | ICD-10-CM

## 2022-09-14 NOTE — Telephone Encounter (Signed)
NP called to check-in and patient; left him a voicemail with callback number.

## 2022-09-15 DIAGNOSIS — Z9181 History of falling: Secondary | ICD-10-CM | POA: Diagnosis not present

## 2022-09-15 DIAGNOSIS — R2681 Unsteadiness on feet: Secondary | ICD-10-CM | POA: Diagnosis not present

## 2022-09-15 DIAGNOSIS — R2689 Other abnormalities of gait and mobility: Secondary | ICD-10-CM | POA: Diagnosis not present

## 2022-09-15 DIAGNOSIS — M6281 Muscle weakness (generalized): Secondary | ICD-10-CM | POA: Diagnosis not present

## 2022-09-18 DIAGNOSIS — R2689 Other abnormalities of gait and mobility: Secondary | ICD-10-CM | POA: Diagnosis not present

## 2022-09-18 DIAGNOSIS — R2681 Unsteadiness on feet: Secondary | ICD-10-CM | POA: Diagnosis not present

## 2022-09-18 DIAGNOSIS — Z9181 History of falling: Secondary | ICD-10-CM | POA: Diagnosis not present

## 2022-09-20 DIAGNOSIS — R2689 Other abnormalities of gait and mobility: Secondary | ICD-10-CM | POA: Diagnosis not present

## 2022-09-20 DIAGNOSIS — Z9181 History of falling: Secondary | ICD-10-CM | POA: Diagnosis not present

## 2022-09-20 DIAGNOSIS — M6281 Muscle weakness (generalized): Secondary | ICD-10-CM | POA: Diagnosis not present

## 2022-09-20 DIAGNOSIS — E1165 Type 2 diabetes mellitus with hyperglycemia: Secondary | ICD-10-CM | POA: Diagnosis not present

## 2022-09-20 DIAGNOSIS — R2681 Unsteadiness on feet: Secondary | ICD-10-CM | POA: Diagnosis not present

## 2022-09-21 DIAGNOSIS — R2681 Unsteadiness on feet: Secondary | ICD-10-CM | POA: Diagnosis not present

## 2022-09-21 DIAGNOSIS — R2689 Other abnormalities of gait and mobility: Secondary | ICD-10-CM | POA: Diagnosis not present

## 2022-09-21 DIAGNOSIS — Z9181 History of falling: Secondary | ICD-10-CM | POA: Diagnosis not present

## 2022-09-21 NOTE — Telephone Encounter (Signed)
Prescription for cpap (order form) signed and faxed back to [phone]. (Received confirmation).

## 2022-09-22 ENCOUNTER — Telehealth: Payer: Self-pay | Admitting: Nurse Practitioner

## 2022-09-22 DIAGNOSIS — R2689 Other abnormalities of gait and mobility: Secondary | ICD-10-CM | POA: Diagnosis not present

## 2022-09-22 DIAGNOSIS — M6281 Muscle weakness (generalized): Secondary | ICD-10-CM | POA: Diagnosis not present

## 2022-09-22 DIAGNOSIS — R2681 Unsteadiness on feet: Secondary | ICD-10-CM | POA: Diagnosis not present

## 2022-09-22 DIAGNOSIS — Z9181 History of falling: Secondary | ICD-10-CM | POA: Diagnosis not present

## 2022-09-22 NOTE — Telephone Encounter (Signed)
I attempted to contact Dr Lin Landsman to set up a f/u pc visit, last visit was 09/2021, message left on mobile, unable to leave a message on home phone.

## 2022-09-25 DIAGNOSIS — R2689 Other abnormalities of gait and mobility: Secondary | ICD-10-CM | POA: Diagnosis not present

## 2022-09-25 DIAGNOSIS — Z9181 History of falling: Secondary | ICD-10-CM | POA: Diagnosis not present

## 2022-09-25 DIAGNOSIS — R2681 Unsteadiness on feet: Secondary | ICD-10-CM | POA: Diagnosis not present

## 2022-09-26 DIAGNOSIS — M6281 Muscle weakness (generalized): Secondary | ICD-10-CM | POA: Diagnosis not present

## 2022-09-27 DIAGNOSIS — Z9181 History of falling: Secondary | ICD-10-CM | POA: Diagnosis not present

## 2022-09-27 DIAGNOSIS — R2681 Unsteadiness on feet: Secondary | ICD-10-CM | POA: Diagnosis not present

## 2022-09-27 DIAGNOSIS — R2689 Other abnormalities of gait and mobility: Secondary | ICD-10-CM | POA: Diagnosis not present

## 2022-09-28 DIAGNOSIS — Z9181 History of falling: Secondary | ICD-10-CM | POA: Diagnosis not present

## 2022-09-28 DIAGNOSIS — R2681 Unsteadiness on feet: Secondary | ICD-10-CM | POA: Diagnosis not present

## 2022-09-28 DIAGNOSIS — R2689 Other abnormalities of gait and mobility: Secondary | ICD-10-CM | POA: Diagnosis not present

## 2022-09-29 DIAGNOSIS — M6281 Muscle weakness (generalized): Secondary | ICD-10-CM | POA: Diagnosis not present

## 2022-10-02 DIAGNOSIS — M6281 Muscle weakness (generalized): Secondary | ICD-10-CM | POA: Diagnosis not present

## 2022-10-03 DIAGNOSIS — Z9181 History of falling: Secondary | ICD-10-CM | POA: Diagnosis not present

## 2022-10-03 DIAGNOSIS — R2689 Other abnormalities of gait and mobility: Secondary | ICD-10-CM | POA: Diagnosis not present

## 2022-10-03 DIAGNOSIS — R2681 Unsteadiness on feet: Secondary | ICD-10-CM | POA: Diagnosis not present

## 2022-10-04 ENCOUNTER — Encounter: Payer: Self-pay | Admitting: Internal Medicine

## 2022-10-04 ENCOUNTER — Ambulatory Visit (INDEPENDENT_AMBULATORY_CARE_PROVIDER_SITE_OTHER): Payer: Medicare PPO | Admitting: Internal Medicine

## 2022-10-04 ENCOUNTER — Telehealth: Payer: Medicare Other

## 2022-10-04 ENCOUNTER — Telehealth: Payer: Self-pay | Admitting: Family Medicine

## 2022-10-04 VITALS — BP 92/60 | HR 72 | Temp 97.0°F | Ht 67.75 in | Wt 194.0 lb

## 2022-10-04 DIAGNOSIS — K2100 Gastro-esophageal reflux disease with esophagitis, without bleeding: Secondary | ICD-10-CM | POA: Diagnosis not present

## 2022-10-04 DIAGNOSIS — R2689 Other abnormalities of gait and mobility: Secondary | ICD-10-CM | POA: Diagnosis not present

## 2022-10-04 DIAGNOSIS — E1169 Type 2 diabetes mellitus with other specified complication: Secondary | ICD-10-CM | POA: Diagnosis not present

## 2022-10-04 DIAGNOSIS — E785 Hyperlipidemia, unspecified: Secondary | ICD-10-CM

## 2022-10-04 DIAGNOSIS — Z9181 History of falling: Secondary | ICD-10-CM | POA: Diagnosis not present

## 2022-10-04 DIAGNOSIS — R059 Cough, unspecified: Secondary | ICD-10-CM | POA: Diagnosis not present

## 2022-10-04 DIAGNOSIS — R410 Disorientation, unspecified: Secondary | ICD-10-CM | POA: Diagnosis not present

## 2022-10-04 DIAGNOSIS — R2681 Unsteadiness on feet: Secondary | ICD-10-CM | POA: Diagnosis not present

## 2022-10-04 LAB — CBC
HCT: 40.1 % (ref 39.0–52.0)
Hemoglobin: 13.8 g/dL (ref 13.0–17.0)
MCHC: 34.3 g/dL (ref 30.0–36.0)
MCV: 88.4 fl (ref 78.0–100.0)
Platelets: 179 10*3/uL (ref 150.0–400.0)
RBC: 4.54 Mil/uL (ref 4.22–5.81)
RDW: 13.5 % (ref 11.5–15.5)
WBC: 8.7 10*3/uL (ref 4.0–10.5)

## 2022-10-04 LAB — COMPREHENSIVE METABOLIC PANEL
ALT: 12 U/L (ref 0–53)
AST: 19 U/L (ref 0–37)
Albumin: 3.6 g/dL (ref 3.5–5.2)
Alkaline Phosphatase: 42 U/L (ref 39–117)
BUN: 23 mg/dL (ref 6–23)
CO2: 29 mEq/L (ref 19–32)
Calcium: 8.8 mg/dL (ref 8.4–10.5)
Chloride: 101 mEq/L (ref 96–112)
Creatinine, Ser: 1.37 mg/dL (ref 0.40–1.50)
GFR: 45.81 mL/min — ABNORMAL LOW (ref >60.00)
Glucose, Bld: 131 mg/dL — ABNORMAL HIGH (ref 70–99)
Potassium: 3.8 mEq/L (ref 3.5–5.1)
Sodium: 136 mEq/L (ref 135–145)
Total Bilirubin: 0.5 mg/dL (ref 0.2–1.2)
Total Protein: 7.1 g/dL (ref 6.0–8.3)

## 2022-10-04 LAB — POC URINALSYSI DIPSTICK (AUTOMATED)
Bilirubin, UA: NEGATIVE
Blood, UA: NEGATIVE
Glucose, UA: NEGATIVE
Ketones, UA: NEGATIVE
Leukocytes, UA: NEGATIVE
Nitrite, UA: NEGATIVE
Protein, UA: POSITIVE — AB
Spec Grav, UA: 1.025 (ref 1.010–1.025)
Urobilinogen, UA: 0.2 E.U./dL
pH, UA: 5.5 (ref 5.0–8.0)

## 2022-10-04 MED ORDER — HYDROCOD POLI-CHLORPHE POLI ER 10-8 MG/5ML PO SUER: 5.0000 mL | Freq: Every evening | ORAL | 0 refills | Status: DC | PRN

## 2022-10-04 NOTE — Assessment & Plan Note (Signed)
Can be rarely severe and asks for refill on tussionex for rare use (lost old bottle) Discussed holding off till confusion clear

## 2022-10-04 NOTE — Telephone Encounter (Signed)
Noted. Appreciate Dr Silvio Pate seeing patient.

## 2022-10-04 NOTE — Assessment & Plan Note (Signed)
Did have spell that woke him 2 nights ago Knows behavioral measures On the aciphex Asked him to use the prn pepcid nightly for the next week

## 2022-10-04 NOTE — Addendum Note (Signed)
Addended by: Pilar Grammes on: 10/04/2022 12:28 PM   Modules accepted: Orders

## 2022-10-04 NOTE — Telephone Encounter (Signed)
Jessenia, the pt's CNA, called stating the pt is experiencing confusion along with a uti & pink eyes today. Jessenia wanted to let Dr. Darnell Level know. Scheduled pt to see Dr. Silvio Pate today. 1/17. Call back # [phone]

## 2022-10-04 NOTE — Assessment & Plan Note (Signed)
Lab Results  Component Value Date   HGBA1C 7.9 (H) 07/31/2022   Control has been acceptable Diarrhea may be from the ozempic--discussed adequate fluid intake

## 2022-10-04 NOTE — Progress Notes (Signed)
Subjective:    Patient ID: James Spurr, MD, male    DOB: [birth year], 89 y.o.   MRN: [id number]  HPI Here with caretaker, Keane Scrape due to several concerns  Some confusion this morning upon awakening Unsteady on his feet also Not present yesterday Up 4AM --and got up and showered (instead of going back to work) Some word finding problems---and verbal issues (not new)  Noticed some cloudy urine or concentration this morning Thinks he may not be drinking enough water No dysuria or hematuria No fever  No eating great---he doesn't think so Was trying to lose weight--but hasn't   No SOB Does get acid reflux and will get into esophagus---causes some cough Woke 2 nights ago with reflux spell--with cough all night Slept better last night with robitussin On aciphex and prn pepcid  Some diarrhea Goes back 2 weeks--2-3 per day No abdominal pain No blood  Current Outpatient Medications on File Prior to Visit  Medication Sig Dispense Refill   Alpha-Lipoic Acid 600 MG CAPS Take 1 capsule (600 mg total) by mouth daily. 30 capsule 11   brimonidine-timolol (COMBIGAN) 0.2-0.5 % ophthalmic solution Place 1 drop into both eyes every 12 (twelve) hours.     clopidogrel (PLAVIX) 75 MG tablet TAKE 1 TABLET DAILY 90 tablet 1   Coenzyme Q10 (COQ10) 150 MG CAPS Take 1 capsule by mouth daily.     DULoxetine (CYMBALTA) 60 MG capsule TAKE 1 CAPSULE DAILY 90 capsule 3   famotidine (PEPCID) 20 MG tablet Take 1 tablet (20 mg total) by mouth at bedtime.     fluconazole (DIFLUCAN) 150 MG tablet Take 1 tablet (150 mg total) by mouth once a week. 2 tablet 0   fluticasone (FLOVENT HFA) 110 MCG/ACT inhaler Inhale 1 puff into the lungs in the morning and at bedtime. 1 each 1   gabapentin (NEURONTIN) 300 MG capsule Take 2 capsules (600 mg total) by mouth 3 (three) times daily. 600 mg BID is his minimum dose without having breakthrough symptoms. He is aware 900 mg/day is recommended per kidney function. 540  capsule 1   insulin aspart (NOVOLOG) 100 UNIT/ML injection CBG 70 - 120: 0 units  CBG 121 - 150: 1 unit  CBG 151 - 200: 2 units  CBG 201 - 250: 3 units  CBG 251 - 300: 5 units  CBG 301 - 350: 7 units  CBG 351 - 400: 9 units 10 mL 11   insulin degludec (TRESIBA FLEXTOUCH) 200 UNIT/ML FlexTouch Pen Inject 90 Units into the skin at bedtime.     Insulin Pen Needle (ULTICARE MICRO PEN NEEDLES) 31G X 8 MM MISC Use to inject medication daily 100 each 3   levothyroxine (SYNTHROID) 50 MCG tablet Take 1 tablet (50 mcg total) by mouth daily before breakfast. Per endo [name]     Magnesium 250 MG TABS Take 1 tablet (250 mg total) by mouth daily.  0   melatonin 5 MG TABS Take 5 mg by mouth at bedtime.     MELATONIN PO Take by mouth at bedtime.     mupirocin ointment (BACTROBAN) 2 % Place 1 application. into the nose 2 (two) times daily. 22 g 0   nitroGLYCERIN (NITROSTAT) 0.4 MG SL tablet DISSOLVE 1 TABLET UNDER THE TONGUE EVERY 5 MINUTES AS NEEDED FOR CHEST PAIN 25 tablet 1   ondansetron (ZOFRAN) 4 MG tablet Take 4 mg by mouth daily as needed for nausea or vomiting.     polyethylene glycol (MIRALAX / GLYCOLAX)  17 g packet Take 17 g by mouth daily as needed. 14 each 0   pramipexole (MIRAPEX) 1 MG tablet TAKE 1 TABLET DAILY AT 4 P.M. 90 tablet 3   RABEprazole (ACIPHEX) 20 MG tablet Take 1 tablet (20 mg total) by mouth daily. 90 tablet 3   rosuvastatin (CRESTOR) 40 MG tablet TAKE 1 TABLET DAILY 90 tablet 3   Semaglutide, 1 MG/DOSE, (OZEMPIC, 1 MG/DOSE,) 4 MG/3ML SOPN Inject 1 mg into the skin once a week.     sennosides-docusate sodium (SENOKOT-S) 8.6-50 MG tablet Take 1 tablet by mouth daily.     sodium chloride (MURO 128) 5 % ophthalmic solution 1 drop as needed for eye irritation.     tamsulosin (FLOMAX) 0.4 MG CAPS capsule TAKE 1 CAPSULE DAILY AFTER BREAKFAST 90 capsule 3   vitamin C (ASCORBIC ACID) 500 MG tablet Take 1,000 mg by mouth daily as needed.     No current facility-administered medications  on file prior to visit.    Allergies  Allergen Reactions   Dulaglutide     Other reaction(s): upset stomach Other reaction(s): Abdominal Pain, upset stomach Other reaction(s): upset stomach    Empagliflozin Other (See Comments)    Other reaction(s): yeast infection Other reaction(s): Other (See Comments) Other reaction(s): yeast infection    Lisinopril Cough    tachycardia   Nexium [Esomeprazole] Other (See Comments)    Headache   Oxycodone Other (See Comments)    Even 1/2 tablet of '5mg'$  caused increased confusion - contributory to hospitalization 10/2020 with AMS   Pregabalin Other (See Comments) and Nausea Only    Other reaction(s): MALAISE, SLIGHT NAUSEA, SLIGHT HEADACHE Other reaction(s): MALAISE, SLIGHT NAUSEA, SLIGHT HEADACHE    Rosuvastatin Other (See Comments)    Other reaction(s): muscle pain 2.16.2022 Pt is current on this medication without any issues. Other reaction(s): muscle pain 2.16.2022 Pt is current on this medication without any issues. Other reaction(s): muscle pain    Past Medical History:  Diagnosis Date   Anemia    Basal cell carcinoma 2018   R temple    Basal cell carcinoma of nose 2002   bridge of nose   BPH with obstruction/lower urinary tract symptoms    failed flomax, uroxatral, myrbetriq - sees urology [name]) pending videourodynamics   CKD (chronic kidney disease) stage 3, GFR 30-59 ml/min ([place]) 03/11/2012   Renal US 02/2014 - multiple kidney cysts    CVA (cerebral infarction) 03/07/2012   slurred speech; right hemplegia, left handed - completed PT 07/2014 (17 sessions)   Dehydration    Depression    mild, on cymbalta per prior PCP   Erectile dysfunction    per urology (prostaglandin injection)   GERD (gastroesophageal reflux disease)    with sliding HH   H/O hiatal hernia    History of kidney problems 12/2010   lacerated left kidney after fall   HLD (hyperlipidemia)    Hx of basal cell carcinoma 07/10/2017   R. lat. forehead  near hair line   Kidney cysts 02/2014   bilateral (renal US)   Migraines 03/11/2012   now rare   OSA on CPAP    sleep study 01/2014 - severe, CPAP at 10, previously on nuvigil (Dr. [name])   Palpitations 01/2012   holter WNL, rare PAC/PVCs   Presbycusis of both ears 2016   rec amplification [facility])   Restless leg syndrome    well controlled on mirapex   Rosacea    [name]   Stroke [facility])  TIA (transient ischemic attack)    Type 2 diabetes, controlled, with neuropathy ([place]) 2011   [name] in [facility]    Past Surgical History:  Procedure Laterality Date   ABI  2007   WNL   CARDIAC CATHETERIZATION  2004   normal; [facility]   CARDIOVASCULAR STRESS TEST  02/2014   WNL, low risk scan, EF 68%   CAROTID ENDARTERECTOMY     CATARACT EXTRACTION W/ INTRAOCULAR LENS IMPLANT  11//2012 - 08/2011   COLONOSCOPY  03/2011   poor prep, unable to biopsy polyp in tic fold, rpt 3 mo [name])   COLONOSCOPY  06/2011   mult polyps adenomatous, severe diverticulosis, rpt 3 yrs [name])   CYSTOSCOPY WITH URETHRAL DILATATION N/A 07/24/2013   Procedure: CYSTOSCOPY WITH URETHRAL DILATATION;  Surgeon: [name], MD;  Location: [facility];  Service: Urology;  Laterality: N/A;   ESOPHAGOGASTRODUODENOSCOPY  2006   duodenitis, medual HH, Grade 2 reflux, mild gastritis   ESOPHAGOGASTRODUODENOSCOPY  07/2011   mild chronic gastritis   HEMORRHOID SURGERY  1991   INGUINAL HERNIA REPAIR  1981; 2002   left, then repaired   MRI brain  04/2010   WNL   PROSTATE SURGERY  02/2009   PVP (laser)   SKIN CANCER EXCISION  2002   bridge of nose, [facility]   TONSILLECTOMY AND ADENOIDECTOMY  ~ [place] N/A 07/24/2013   Procedure: TRANSURETHRAL INCISION OF THE PROSTATE (TUIP);  Surgeon: [name], MD;  Location: [facility];  Service: Urology;  Laterality: N/A;   URETHRAL DILATION  2014   tannenbaum    Family History  Problem Relation Age of Onset   Stroke Father    Lung cancer  Father 30        smoker   Alcoholism Sister    Kidney disease Sister    Stroke Brother    Diabetes Brother 105   Lung cancer Brother    Coronary artery disease Neg Hx    Sleep apnea Neg Hx     Social History   Socioeconomic History   Marital status: Divorced    Spouse name: Not on file   Number of children: 2   Years of education: MD   Highest education level: Not on file  Occupational History   Occupation: retired    Fish farm manager: RETIRED  Tobacco Use   Smoking status: Never    Passive exposure: Never   Smokeless tobacco: Never   Tobacco comments:    "used to smoke pipe when I was youngerCustomer service manager   Vaping Use: Never used  Substance and Sexual Activity   Alcohol use: Yes    Alcohol/week: 4.0 standard drinks of alcohol    Types: 2 Glasses of wine, 2 Shots of liquor per week    Comment: occasional   Drug use: No   Sexual activity: Yes  Other Topics Concern   Not on file  Social History Narrative   Moved into [place] in [place] 12/2020   Caffeine: 1 cup coffee/day     Occupation: ophthalmologist, retired   Edu: MD   Activity: going to gym 3x/wk with trainer   Diet: some water, fruits/vegetables some, red meat a few times, fish 1x/wk   POA - [name] (Daughter)   Social Determinants of Health   Financial Resource Strain: Low Risk  (12/30/2020)   Overall Financial Resource Strain (CARDIA)    Difficulty of Paying Living Expenses: Not hard at all  Food Insecurity: Not on file  Transportation Needs: Not on file  Physical Activity: Not on file  Stress: Not on file  Social Connections: Not on file  Intimate Partner Violence: Not on file   Review of Systems Sleeps okay-- "too much" No chest pain    Objective:   Physical Exam Constitutional:      Appearance: Normal appearance.  Cardiovascular:     Rate and Rhythm: Normal rate and regular rhythm.     Heart sounds: No murmur heard.    No gallop.  Pulmonary:     Effort: Pulmonary  effort is normal.     Breath sounds: Normal breath sounds. No wheezing or rales.  Abdominal:     Palpations: Abdomen is soft.     Tenderness: There is no abdominal tenderness.  Musculoskeletal:     Cervical back: Neck supple.     Comments: Trace edema in calves  Lymphadenopathy:     Cervical: No cervical adenopathy.  Neurological:     Mental Status: He is alert.     Comments: Alert Knows month and year---Dr Gutierrez's office Backwards months fine  Psychiatric:        Mood and Affect: Mood normal.            Assessment & Plan:

## 2022-10-04 NOTE — Assessment & Plan Note (Addendum)
Not overt delirium No evidence of UTI ?related to disordered sleep past 2 nights Will check labs Discussed weaning the gabapentin--he did not tolerate that in the past

## 2022-10-06 DIAGNOSIS — R2689 Other abnormalities of gait and mobility: Secondary | ICD-10-CM | POA: Diagnosis not present

## 2022-10-06 DIAGNOSIS — G4733 Obstructive sleep apnea (adult) (pediatric): Secondary | ICD-10-CM | POA: Diagnosis not present

## 2022-10-06 DIAGNOSIS — Z9181 History of falling: Secondary | ICD-10-CM | POA: Diagnosis not present

## 2022-10-06 DIAGNOSIS — R2681 Unsteadiness on feet: Secondary | ICD-10-CM | POA: Diagnosis not present

## 2022-10-06 DIAGNOSIS — M6281 Muscle weakness (generalized): Secondary | ICD-10-CM | POA: Diagnosis not present

## 2022-10-09 DIAGNOSIS — L570 Actinic keratosis: Secondary | ICD-10-CM | POA: Diagnosis not present

## 2022-10-09 DIAGNOSIS — D2262 Melanocytic nevi of left upper limb, including shoulder: Secondary | ICD-10-CM | POA: Diagnosis not present

## 2022-10-09 DIAGNOSIS — D225 Melanocytic nevi of trunk: Secondary | ICD-10-CM | POA: Diagnosis not present

## 2022-10-09 DIAGNOSIS — D2261 Melanocytic nevi of right upper limb, including shoulder: Secondary | ICD-10-CM | POA: Diagnosis not present

## 2022-10-09 DIAGNOSIS — L821 Other seborrheic keratosis: Secondary | ICD-10-CM | POA: Diagnosis not present

## 2022-10-10 DIAGNOSIS — M6281 Muscle weakness (generalized): Secondary | ICD-10-CM | POA: Diagnosis not present

## 2022-10-11 DIAGNOSIS — R2689 Other abnormalities of gait and mobility: Secondary | ICD-10-CM | POA: Diagnosis not present

## 2022-10-11 DIAGNOSIS — R2681 Unsteadiness on feet: Secondary | ICD-10-CM | POA: Diagnosis not present

## 2022-10-11 DIAGNOSIS — Z9181 History of falling: Secondary | ICD-10-CM | POA: Diagnosis not present

## 2022-10-12 DIAGNOSIS — R2689 Other abnormalities of gait and mobility: Secondary | ICD-10-CM | POA: Diagnosis not present

## 2022-10-12 DIAGNOSIS — R2681 Unsteadiness on feet: Secondary | ICD-10-CM | POA: Diagnosis not present

## 2022-10-12 DIAGNOSIS — Z9181 History of falling: Secondary | ICD-10-CM | POA: Diagnosis not present

## 2022-10-13 DIAGNOSIS — R2681 Unsteadiness on feet: Secondary | ICD-10-CM | POA: Diagnosis not present

## 2022-10-13 DIAGNOSIS — Z9181 History of falling: Secondary | ICD-10-CM | POA: Diagnosis not present

## 2022-10-13 DIAGNOSIS — R2689 Other abnormalities of gait and mobility: Secondary | ICD-10-CM | POA: Diagnosis not present

## 2022-10-14 DIAGNOSIS — M6281 Muscle weakness (generalized): Secondary | ICD-10-CM | POA: Diagnosis not present

## 2022-10-16 DIAGNOSIS — Z9181 History of falling: Secondary | ICD-10-CM | POA: Diagnosis not present

## 2022-10-16 DIAGNOSIS — R2681 Unsteadiness on feet: Secondary | ICD-10-CM | POA: Diagnosis not present

## 2022-10-16 DIAGNOSIS — R2689 Other abnormalities of gait and mobility: Secondary | ICD-10-CM | POA: Diagnosis not present

## 2022-10-17 DIAGNOSIS — M6281 Muscle weakness (generalized): Secondary | ICD-10-CM | POA: Diagnosis not present

## 2022-10-18 DIAGNOSIS — Z9181 History of falling: Secondary | ICD-10-CM | POA: Diagnosis not present

## 2022-10-18 DIAGNOSIS — R2681 Unsteadiness on feet: Secondary | ICD-10-CM | POA: Diagnosis not present

## 2022-10-18 DIAGNOSIS — R2689 Other abnormalities of gait and mobility: Secondary | ICD-10-CM | POA: Diagnosis not present

## 2022-10-19 DIAGNOSIS — H401133 Primary open-angle glaucoma, bilateral, severe stage: Secondary | ICD-10-CM | POA: Diagnosis not present

## 2022-10-20 DIAGNOSIS — E1165 Type 2 diabetes mellitus with hyperglycemia: Secondary | ICD-10-CM | POA: Diagnosis not present

## 2022-10-20 DIAGNOSIS — R2681 Unsteadiness on feet: Secondary | ICD-10-CM | POA: Diagnosis not present

## 2022-10-20 DIAGNOSIS — R2689 Other abnormalities of gait and mobility: Secondary | ICD-10-CM | POA: Diagnosis not present

## 2022-10-20 DIAGNOSIS — M6281 Muscle weakness (generalized): Secondary | ICD-10-CM | POA: Diagnosis not present

## 2022-10-20 DIAGNOSIS — Z9181 History of falling: Secondary | ICD-10-CM | POA: Diagnosis not present

## 2022-10-24 DIAGNOSIS — R2689 Other abnormalities of gait and mobility: Secondary | ICD-10-CM | POA: Diagnosis not present

## 2022-10-24 DIAGNOSIS — R2681 Unsteadiness on feet: Secondary | ICD-10-CM | POA: Diagnosis not present

## 2022-10-24 DIAGNOSIS — Z9181 History of falling: Secondary | ICD-10-CM | POA: Diagnosis not present

## 2022-10-25 DIAGNOSIS — R2689 Other abnormalities of gait and mobility: Secondary | ICD-10-CM | POA: Diagnosis not present

## 2022-10-25 DIAGNOSIS — R2681 Unsteadiness on feet: Secondary | ICD-10-CM | POA: Diagnosis not present

## 2022-10-25 DIAGNOSIS — M6281 Muscle weakness (generalized): Secondary | ICD-10-CM | POA: Diagnosis not present

## 2022-10-25 DIAGNOSIS — Z9181 History of falling: Secondary | ICD-10-CM | POA: Diagnosis not present

## 2022-10-26 DIAGNOSIS — M6281 Muscle weakness (generalized): Secondary | ICD-10-CM | POA: Diagnosis not present

## 2022-10-26 DIAGNOSIS — Z9181 History of falling: Secondary | ICD-10-CM | POA: Diagnosis not present

## 2022-10-26 DIAGNOSIS — R2689 Other abnormalities of gait and mobility: Secondary | ICD-10-CM | POA: Diagnosis not present

## 2022-10-26 DIAGNOSIS — R2681 Unsteadiness on feet: Secondary | ICD-10-CM | POA: Diagnosis not present

## 2022-10-27 DIAGNOSIS — H401133 Primary open-angle glaucoma, bilateral, severe stage: Secondary | ICD-10-CM | POA: Diagnosis not present

## 2022-10-30 DIAGNOSIS — M6281 Muscle weakness (generalized): Secondary | ICD-10-CM | POA: Diagnosis not present

## 2022-10-31 ENCOUNTER — Encounter: Payer: Self-pay | Admitting: Family Medicine

## 2022-10-31 ENCOUNTER — Ambulatory Visit (INDEPENDENT_AMBULATORY_CARE_PROVIDER_SITE_OTHER): Payer: Medicare PPO | Admitting: Family Medicine

## 2022-10-31 VITALS — BP 136/72 | HR 83 | Temp 97.9°F | Ht 67.75 in | Wt 199.2 lb

## 2022-10-31 DIAGNOSIS — R413 Other amnesia: Secondary | ICD-10-CM

## 2022-10-31 DIAGNOSIS — R2681 Unsteadiness on feet: Secondary | ICD-10-CM | POA: Diagnosis not present

## 2022-10-31 DIAGNOSIS — Z9181 History of falling: Secondary | ICD-10-CM | POA: Diagnosis not present

## 2022-10-31 DIAGNOSIS — Z794 Long term (current) use of insulin: Secondary | ICD-10-CM

## 2022-10-31 DIAGNOSIS — R2689 Other abnormalities of gait and mobility: Secondary | ICD-10-CM | POA: Diagnosis not present

## 2022-10-31 DIAGNOSIS — E1165 Type 2 diabetes mellitus with hyperglycemia: Secondary | ICD-10-CM

## 2022-10-31 NOTE — Patient Instructions (Addendum)
Memory testing overall very reassuring.  Memory trouble may be medicine related.  Medications to consider - duloxetine (Cymbalta), gabapentin, insulin (if associated with hypoglycemia), mirapex.   Reviewed 4 core lifestyle modifications to support a healthy mind:  1. Nutritious well balance diet.  2. Regular physical activity routine.  3. Regular mental activity such as reading books, word puzzles, math puzzles, jigsaw puzzles.  4. Social engagement.  Also ensure good blood pressure control, limit alcohol, no smoking.

## 2022-10-31 NOTE — Progress Notes (Unsigned)
Patient ID: James Spurr, James Bell, male    DOB: 23-Oct-1930, 89 y.o.   MRN: [id number]  This visit was conducted in person.  BP 136/72   Pulse 83   Temp 97.9 F (36.6 C) (Temporal)   Ht 5' 7.75" (1.721 m)   Wt 199 lb 4 oz (90.4 kg)   SpO2 97%   BMI 30.52 kg/m    CC: 3 mo memory eval Subjective:   HPI: James Spurr, James Bell is a 89 y.o. male presenting on 10/31/2022 for Medical Management of Chronic Issues (Here for 3 mo geriatric assessment. Pt accompanied by caregiver, Davy Pique. )   Geriatric Assessment: Activities of Daily Living:     Bathing- independent    Dressing- independent    Eating- independent    Toileting- independent    Transferring- independent    Continence-independent Overall Assessment: independent   Instrumental Activities of Daily Living:     Transportation- independent     Meal/Food Preparation- dependent    Shopping Errands- dependent     Housekeeping/Chores- dependent     Money Management/Finances- dependent (daughter)     Medication Management- partially dependent     Ability to Use Telephone- independent     Laundry- dependent  Overall Assessment: overall dependent  Mental Status Exam: 29/30 (value/max value)  Clock Drawing Score: 4/4       Relevant past medical, surgical, family and social history reviewed and updated as indicated. Interim medical history since our last visit reviewed. Allergies and medications reviewed and updated. Outpatient Medications Prior to Visit  Medication Sig Dispense Refill   Alpha-Lipoic Acid 600 MG CAPS Take 1 capsule (600 mg total) by mouth daily. 30 capsule 11   brimonidine-timolol (COMBIGAN) 0.2-0.5 % ophthalmic solution Place 1 drop into both eyes every 12 (twelve) hours.     chlorpheniramine-HYDROcodone (TUSSIONEX) 10-8 MG/5ML Take 5 mLs by mouth at bedtime as needed for cough. 70 mL 0   clopidogrel (PLAVIX) 75 MG tablet TAKE 1 TABLET DAILY 90 tablet 1   Coenzyme Q10 (COQ10) 150 MG CAPS Take 1 capsule  by mouth daily.     DULoxetine (CYMBALTA) 60 MG capsule TAKE 1 CAPSULE DAILY 90 capsule 3   famotidine (PEPCID) 20 MG tablet Take 1 tablet (20 mg total) by mouth at bedtime.     fluconazole (DIFLUCAN) 150 MG tablet Take 1 tablet (150 mg total) by mouth once a week. 2 tablet 0   fluticasone (FLOVENT HFA) 110 MCG/ACT inhaler Inhale 1 puff into the lungs in the morning and at bedtime. 1 each 1   gabapentin (NEURONTIN) 300 MG capsule Take 2 capsules (600 mg total) by mouth 3 (three) times daily. 600 mg BID is his minimum dose without having breakthrough symptoms. He is aware 900 mg/day is recommended per kidney function. 540 capsule 1   insulin aspart (NOVOLOG) 100 UNIT/ML injection CBG 70 - 120: 0 units  CBG 121 - 150: 1 unit  CBG 151 - 200: 2 units  CBG 201 - 250: 3 units  CBG 251 - 300: 5 units  CBG 301 - 350: 7 units  CBG 351 - 400: 9 units 10 mL 11   insulin degludec (TRESIBA FLEXTOUCH) 200 UNIT/ML FlexTouch Pen Inject 90 Units into the skin at bedtime.     Insulin Pen Needle (ULTICARE MICRO PEN NEEDLES) 31G X 8 MM MISC Use to inject medication daily 100 each 3   levothyroxine (SYNTHROID) 50 MCG tablet Take 1 tablet (50 mcg total) by mouth daily  before breakfast. Per endo [name]     mupirocin ointment (BACTROBAN) 2 % Place 1 application. into the nose 2 (two) times daily. 22 g 0   nitroGLYCERIN (NITROSTAT) 0.4 MG SL tablet DISSOLVE 1 TABLET UNDER THE TONGUE EVERY 5 MINUTES AS NEEDED FOR CHEST PAIN 25 tablet 1   ondansetron (ZOFRAN) 4 MG tablet Take 4 mg by mouth daily as needed for nausea or vomiting.     polyethylene glycol (MIRALAX / GLYCOLAX) 17 g packet Take 17 g by mouth daily as needed. 14 each 0   pramipexole (MIRAPEX) 1 MG tablet TAKE 1 TABLET DAILY AT 4 P.M. 90 tablet 3   RABEprazole (ACIPHEX) 20 MG tablet Take 1 tablet (20 mg total) by mouth daily. 90 tablet 3   rosuvastatin (CRESTOR) 40 MG tablet TAKE 1 TABLET DAILY 90 tablet 3   Semaglutide, 1 MG/DOSE, (OZEMPIC, 1 MG/DOSE,) 4  MG/3ML SOPN Inject 1 mg into the skin once a week.     sennosides-docusate sodium (SENOKOT-S) 8.6-50 MG tablet Take 1 tablet by mouth daily.     sodium chloride (MURO 128) 5 % ophthalmic solution 1 drop as needed for eye irritation.     tamsulosin (FLOMAX) 0.4 MG CAPS capsule TAKE 1 CAPSULE DAILY AFTER BREAKFAST 90 capsule 3   vitamin C (ASCORBIC ACID) 500 MG tablet Take 1,000 mg by mouth daily as needed.     No facility-administered medications prior to visit.     Per HPI unless specifically indicated in ROS section below Review of Systems  Objective:  BP 136/72   Pulse 83   Temp 97.9 F (36.6 C) (Temporal)   Ht 5' 7.75" (1.721 m)   Wt 199 lb 4 oz (90.4 kg)   SpO2 97%   BMI 30.52 kg/m   Wt Readings from Last 3 Encounters:  10/31/22 199 lb 4 oz (90.4 kg)  10/04/22 194 lb (88 kg)  07/31/22 192 lb 12.8 oz (87.5 kg)      Physical Exam    Results for orders placed or performed in visit on 10/04/22  Comprehensive metabolic panel  Result Value Ref Range   Sodium 136 135 - 145 mEq/L   Potassium 3.8 3.5 - 5.1 mEq/L   Chloride 101 96 - 112 mEq/L   CO2 29 19 - 32 mEq/L   Glucose, Bld 131 (H) 70 - 99 mg/dL   BUN 23 6 - 23 mg/dL   Creatinine, Ser 1.37 0.40 - 1.50 mg/dL   Total Bilirubin 0.5 0.2 - 1.2 mg/dL   Alkaline Phosphatase 42 39 - 117 U/L   AST 19 0 - 37 U/L   ALT 12 0 - 53 U/L   Total Protein 7.1 6.0 - 8.3 g/dL   Albumin 3.6 3.5 - 5.2 g/dL   GFR 45.81 (L) >60.00 mL/min   Calcium 8.8 8.4 - 10.5 mg/dL  CBC  Result Value Ref Range   WBC 8.7 4.0 - 10.5 K/uL   RBC 4.54 4.22 - 5.81 Mil/uL   Platelets 179.0 150.0 - 400.0 K/uL   Hemoglobin 13.8 13.0 - 17.0 g/dL   HCT 40.1 39.0 - 52.0 %   MCV 88.4 78.0 - 100.0 fl   MCHC 34.3 30.0 - 36.0 g/dL   RDW 13.5 11.5 - 15.5 %  POCT Urinalysis Dipstick (Automated)  Result Value Ref Range   Color, UA yellow    Clarity, UA clear    Glucose, UA Negative Negative   Bilirubin, UA negative    Ketones, UA negative  Spec Grav, UA  1.025 1.010 - 1.025   Blood, UA negative    pH, UA 5.5 5.0 - 8.0   Protein, UA Positive (A) Negative   Urobilinogen, UA 0.2 0.2 or 1.0 E.U./dL   Nitrite, UA negative    Leukocytes, UA Negative Negative   *Note: Due to a large number of results and/or encounters for the requested time period, some results have not been displayed. A complete set of results can be found in Results Review.   Lab Results  Component Value Date   IRON 76 07/31/2022   TIBC 301.0 07/31/2022   FERRITIN 96.5 07/31/2022    Assessment & Plan:   Problem List Items Addressed This Visit   None    No orders of the defined types were placed in this encounter.   No orders of the defined types were placed in this encounter.   There are no Patient Instructions on file for this visit.  Follow up plan: No follow-ups on file.  [name], James Bell

## 2022-11-01 DIAGNOSIS — M6281 Muscle weakness (generalized): Secondary | ICD-10-CM | POA: Diagnosis not present

## 2022-11-01 NOTE — Assessment & Plan Note (Signed)
Sees endo, notes some hypoglycemic episodes on current insulin regimen - encouraged endo f/u.

## 2022-11-01 NOTE — Assessment & Plan Note (Addendum)
09/2022: MMSE 28/30 misses 1 orientation and 1 recall, remembered with cue, CDT 4/4, independent in ADLs, dependent in IADLs due to stroke with residual R hemiparesis.  Overall reassuring memory evaluation today - no signs of dementia. However will continue to monitor for vascular dementia in stroke history.  Discussed possible relation of medications to memory issues - specifically gabapentin, cymbalta, mirapex, insulin if associated with hypoglycemia.  Provided with handout on lifestyle modifications to support a health mind.

## 2022-11-02 DIAGNOSIS — R2689 Other abnormalities of gait and mobility: Secondary | ICD-10-CM | POA: Diagnosis not present

## 2022-11-02 DIAGNOSIS — Z9181 History of falling: Secondary | ICD-10-CM | POA: Diagnosis not present

## 2022-11-02 DIAGNOSIS — R2681 Unsteadiness on feet: Secondary | ICD-10-CM | POA: Diagnosis not present

## 2022-11-03 DIAGNOSIS — Z9181 History of falling: Secondary | ICD-10-CM | POA: Diagnosis not present

## 2022-11-03 DIAGNOSIS — R2681 Unsteadiness on feet: Secondary | ICD-10-CM | POA: Diagnosis not present

## 2022-11-03 DIAGNOSIS — R2689 Other abnormalities of gait and mobility: Secondary | ICD-10-CM | POA: Diagnosis not present

## 2022-11-06 DIAGNOSIS — R2689 Other abnormalities of gait and mobility: Secondary | ICD-10-CM | POA: Diagnosis not present

## 2022-11-06 DIAGNOSIS — G4733 Obstructive sleep apnea (adult) (pediatric): Secondary | ICD-10-CM | POA: Diagnosis not present

## 2022-11-06 DIAGNOSIS — Z9181 History of falling: Secondary | ICD-10-CM | POA: Diagnosis not present

## 2022-11-06 DIAGNOSIS — R2681 Unsteadiness on feet: Secondary | ICD-10-CM | POA: Diagnosis not present

## 2022-11-07 DIAGNOSIS — M6281 Muscle weakness (generalized): Secondary | ICD-10-CM | POA: Diagnosis not present

## 2022-11-08 DIAGNOSIS — H903 Sensorineural hearing loss, bilateral: Secondary | ICD-10-CM | POA: Diagnosis not present

## 2022-11-08 DIAGNOSIS — G4733 Obstructive sleep apnea (adult) (pediatric): Secondary | ICD-10-CM | POA: Diagnosis not present

## 2022-11-09 DIAGNOSIS — R2681 Unsteadiness on feet: Secondary | ICD-10-CM | POA: Diagnosis not present

## 2022-11-09 DIAGNOSIS — Z9181 History of falling: Secondary | ICD-10-CM | POA: Diagnosis not present

## 2022-11-09 DIAGNOSIS — R2689 Other abnormalities of gait and mobility: Secondary | ICD-10-CM | POA: Diagnosis not present

## 2022-11-09 DIAGNOSIS — M6281 Muscle weakness (generalized): Secondary | ICD-10-CM | POA: Diagnosis not present

## 2022-11-10 DIAGNOSIS — R2689 Other abnormalities of gait and mobility: Secondary | ICD-10-CM | POA: Diagnosis not present

## 2022-11-10 DIAGNOSIS — R2681 Unsteadiness on feet: Secondary | ICD-10-CM | POA: Diagnosis not present

## 2022-11-10 DIAGNOSIS — Z9181 History of falling: Secondary | ICD-10-CM | POA: Diagnosis not present

## 2022-11-13 DIAGNOSIS — M6281 Muscle weakness (generalized): Secondary | ICD-10-CM | POA: Diagnosis not present

## 2022-11-13 DIAGNOSIS — Z9181 History of falling: Secondary | ICD-10-CM | POA: Diagnosis not present

## 2022-11-13 DIAGNOSIS — R2681 Unsteadiness on feet: Secondary | ICD-10-CM | POA: Diagnosis not present

## 2022-11-13 DIAGNOSIS — R2689 Other abnormalities of gait and mobility: Secondary | ICD-10-CM | POA: Diagnosis not present

## 2022-11-14 DIAGNOSIS — M6281 Muscle weakness (generalized): Secondary | ICD-10-CM | POA: Diagnosis not present

## 2022-11-15 DIAGNOSIS — R2689 Other abnormalities of gait and mobility: Secondary | ICD-10-CM | POA: Diagnosis not present

## 2022-11-15 DIAGNOSIS — Z9181 History of falling: Secondary | ICD-10-CM | POA: Diagnosis not present

## 2022-11-15 DIAGNOSIS — R2681 Unsteadiness on feet: Secondary | ICD-10-CM | POA: Diagnosis not present

## 2022-11-16 DIAGNOSIS — Z9181 History of falling: Secondary | ICD-10-CM | POA: Diagnosis not present

## 2022-11-16 DIAGNOSIS — R2689 Other abnormalities of gait and mobility: Secondary | ICD-10-CM | POA: Diagnosis not present

## 2022-11-16 DIAGNOSIS — R2681 Unsteadiness on feet: Secondary | ICD-10-CM | POA: Diagnosis not present

## 2022-11-17 DIAGNOSIS — M6281 Muscle weakness (generalized): Secondary | ICD-10-CM | POA: Diagnosis not present

## 2022-11-17 DIAGNOSIS — R2689 Other abnormalities of gait and mobility: Secondary | ICD-10-CM | POA: Diagnosis not present

## 2022-11-17 DIAGNOSIS — R2681 Unsteadiness on feet: Secondary | ICD-10-CM | POA: Diagnosis not present

## 2022-11-17 DIAGNOSIS — Z9181 History of falling: Secondary | ICD-10-CM | POA: Diagnosis not present

## 2022-11-19 DIAGNOSIS — E1165 Type 2 diabetes mellitus with hyperglycemia: Secondary | ICD-10-CM | POA: Diagnosis not present

## 2022-11-20 DIAGNOSIS — R2681 Unsteadiness on feet: Secondary | ICD-10-CM | POA: Diagnosis not present

## 2022-11-20 DIAGNOSIS — Z9181 History of falling: Secondary | ICD-10-CM | POA: Diagnosis not present

## 2022-11-20 DIAGNOSIS — R2689 Other abnormalities of gait and mobility: Secondary | ICD-10-CM | POA: Diagnosis not present

## 2022-11-21 DIAGNOSIS — M6281 Muscle weakness (generalized): Secondary | ICD-10-CM | POA: Diagnosis not present

## 2022-11-22 DIAGNOSIS — R2681 Unsteadiness on feet: Secondary | ICD-10-CM | POA: Diagnosis not present

## 2022-11-22 DIAGNOSIS — R2689 Other abnormalities of gait and mobility: Secondary | ICD-10-CM | POA: Diagnosis not present

## 2022-11-22 DIAGNOSIS — Z9181 History of falling: Secondary | ICD-10-CM | POA: Diagnosis not present

## 2022-11-23 DIAGNOSIS — M6281 Muscle weakness (generalized): Secondary | ICD-10-CM | POA: Diagnosis not present

## 2022-11-24 DIAGNOSIS — Z9181 History of falling: Secondary | ICD-10-CM | POA: Diagnosis not present

## 2022-11-24 DIAGNOSIS — R2681 Unsteadiness on feet: Secondary | ICD-10-CM | POA: Diagnosis not present

## 2022-11-24 DIAGNOSIS — R2689 Other abnormalities of gait and mobility: Secondary | ICD-10-CM | POA: Diagnosis not present

## 2022-11-27 DIAGNOSIS — Z9181 History of falling: Secondary | ICD-10-CM | POA: Diagnosis not present

## 2022-11-27 DIAGNOSIS — R2681 Unsteadiness on feet: Secondary | ICD-10-CM | POA: Diagnosis not present

## 2022-11-27 DIAGNOSIS — R2689 Other abnormalities of gait and mobility: Secondary | ICD-10-CM | POA: Diagnosis not present

## 2022-11-28 DIAGNOSIS — S81812A Laceration without foreign body, left lower leg, initial encounter: Secondary | ICD-10-CM | POA: Diagnosis not present

## 2022-11-28 DIAGNOSIS — M6281 Muscle weakness (generalized): Secondary | ICD-10-CM | POA: Diagnosis not present

## 2022-11-30 DIAGNOSIS — M6281 Muscle weakness (generalized): Secondary | ICD-10-CM | POA: Diagnosis not present

## 2022-12-01 DIAGNOSIS — Z9181 History of falling: Secondary | ICD-10-CM | POA: Diagnosis not present

## 2022-12-01 DIAGNOSIS — R2681 Unsteadiness on feet: Secondary | ICD-10-CM | POA: Diagnosis not present

## 2022-12-01 DIAGNOSIS — R2689 Other abnormalities of gait and mobility: Secondary | ICD-10-CM | POA: Diagnosis not present

## 2022-12-04 DIAGNOSIS — Z9181 History of falling: Secondary | ICD-10-CM | POA: Diagnosis not present

## 2022-12-04 DIAGNOSIS — R2681 Unsteadiness on feet: Secondary | ICD-10-CM | POA: Diagnosis not present

## 2022-12-04 DIAGNOSIS — R2689 Other abnormalities of gait and mobility: Secondary | ICD-10-CM | POA: Diagnosis not present

## 2022-12-05 ENCOUNTER — Encounter: Payer: Self-pay | Admitting: Adult Health

## 2022-12-05 ENCOUNTER — Ambulatory Visit (INDEPENDENT_AMBULATORY_CARE_PROVIDER_SITE_OTHER): Payer: Medicare PPO | Admitting: Adult Health

## 2022-12-05 VITALS — BP 120/67 | HR 81 | Ht 68.0 in | Wt 202.0 lb

## 2022-12-05 DIAGNOSIS — G4733 Obstructive sleep apnea (adult) (pediatric): Secondary | ICD-10-CM | POA: Diagnosis not present

## 2022-12-05 DIAGNOSIS — M6281 Muscle weakness (generalized): Secondary | ICD-10-CM | POA: Diagnosis not present

## 2022-12-05 DIAGNOSIS — G2581 Restless legs syndrome: Secondary | ICD-10-CM

## 2022-12-05 NOTE — Progress Notes (Signed)
PATIENT: James Spurr, MD DOB: 1931-01-26  REASON FOR VISIT: follow up HISTORY FROM: patient PRIMARY NEUROLOGIST: Dr. Rexene Alberts  Chief Complaint  Patient presents with   RM 9    Here with caregiver, Davy Pique, for cpap yearly f/u. Patient states everything has been going ok and he has a new machine. It was setup on 09/29/22. ESS 16.     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 Returns today for follow-up.  He reports that the CPAP is doing well.  He is here today with a caregiver.  His download is below.  He reports that his primary care has been managing his restless legs.  He is currently on gabapentin, Mirapex, alpha lipoic acid.  Reports that he has had iron infusions in the past.  States that his restless legs are still not under good control.  Primary care is also tried magnesium.  Per patient he has never tried Neupro patch.  I will reach out to his primary care to see if they feel this is a option for him.     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  He returns today with a caregiver.  He is currently at [facility] for rehab.  It sounds as if he was in the hospital for aspiration pneumonia?  He reports that CPAP is doing well.  He does need new supplies  11/15/20: HISTORY: Mr. Mendillo is an 89 year old male with a history of obstructive sleep apnea on CPAP.  He reports that the CPAP is working well for him.  He is more concerned about his restless legs.  He states that he has been seeing Dr. Jeraldine Loots at [facility] and he was originally placed on oxycodone.  He cannot tolerate this medication as it caused altered mental status.  He is currently taking gabapentin 600 mg 4 times a day in addition to Mirapex 1 mg at bedtime.  The patient has been discussing with his PCP.  His PCP would like to decrease his dose of gabapentin due to kidney  function.  The patient is asking for our input as well as he does not want to continue traveling to do.  He would like [name] to take over care of his diagnosis of restless legs.  He states that he has the most trouble at bedtime.  However during the day when his gabapentin dose is due he begins to have symptoms.  He returns today for an evaluation.   REVIEW OF SYSTEMS: Out of a complete 14 system review of symptoms, the patient complains only of the following symptoms, and all other reviewed systems are negative.   ESS 6  ALLERGIES: Allergies  Allergen Reactions   Dulaglutide     Other reaction(s): upset stomach Other reaction(s): Abdominal Pain, upset stomach Other reaction(s): upset stomach    Empagliflozin Other (See Comments)    Other reaction(s): yeast infection Other reaction(s): Other (See Comments) Other reaction(s): yeast infection    Lisinopril Cough    tachycardia   Nexium [Esomeprazole] Other (See Comments)    Headache   Oxycodone Other (See Comments)    Even 1/2 tablet of 5mg  caused increased confusion - contributory to hospitalization 10/2020 with AMS   Pregabalin Other (See Comments) and Nausea Only    Other reaction(s): MALAISE, SLIGHT NAUSEA, SLIGHT HEADACHE Other reaction(s): MALAISE, SLIGHT NAUSEA, SLIGHT HEADACHE  Rosuvastatin Other (See Comments)    Other reaction(s): muscle pain 2.16.2022 Pt is current on this medication without any issues. Other reaction(s): muscle pain 2.16.2022 Pt is current on this medication without any issues. Other reaction(s): muscle pain    HOME MEDICATIONS: Outpatient Medications Prior to Visit  Medication Sig Dispense Refill   Alpha-Lipoic Acid 600 MG CAPS Take 1 capsule (600 mg total) by mouth daily. 30 capsule 11   brimonidine-timolol (COMBIGAN) 0.2-0.5 % ophthalmic solution Place 1 drop into both eyes every 12 (twelve) hours.     chlorpheniramine-HYDROcodone (TUSSIONEX) 10-8 MG/5ML Take 5 mLs by mouth at bedtime as needed  for cough. 70 mL 0   clopidogrel (PLAVIX) 75 MG tablet TAKE 1 TABLET DAILY 90 tablet 1   Coenzyme Q10 (COQ10) 150 MG CAPS Take 1 capsule by mouth daily.     DULoxetine (CYMBALTA) 60 MG capsule TAKE 1 CAPSULE DAILY 90 capsule 3   famotidine (PEPCID) 20 MG tablet Take 1 tablet (20 mg total) by mouth at bedtime.     fluconazole (DIFLUCAN) 150 MG tablet Take 1 tablet (150 mg total) by mouth once a week. 2 tablet 0   fluticasone (FLOVENT HFA) 110 MCG/ACT inhaler Inhale 1 puff into the lungs in the morning and at bedtime. 1 each 1   gabapentin (NEURONTIN) 300 MG capsule Take 2 capsules (600 mg total) by mouth 3 (three) times daily. 600 mg BID is his minimum dose without having breakthrough symptoms. He is aware 900 mg/day is recommended per kidney function. 540 capsule 1   insulin aspart (NOVOLOG) 100 UNIT/ML injection CBG 70 - 120: 0 units  CBG 121 - 150: 1 unit  CBG 151 - 200: 2 units  CBG 201 - 250: 3 units  CBG 251 - 300: 5 units  CBG 301 - 350: 7 units  CBG 351 - 400: 9 units 10 mL 11   insulin degludec (TRESIBA FLEXTOUCH) 200 UNIT/ML FlexTouch Pen Inject 90 Units into the skin at bedtime.     Insulin Pen Needle (ULTICARE MICRO PEN NEEDLES) 31G X 8 MM MISC Use to inject medication daily 100 each 3   levothyroxine (SYNTHROID) 50 MCG tablet Take 1 tablet (50 mcg total) by mouth daily before breakfast. Per endo [name]     mupirocin ointment (BACTROBAN) 2 % Place 1 application. into the nose 2 (two) times daily. 22 g 0   nitroGLYCERIN (NITROSTAT) 0.4 MG SL tablet DISSOLVE 1 TABLET UNDER THE TONGUE EVERY 5 MINUTES AS NEEDED FOR CHEST PAIN 25 tablet 1   ondansetron (ZOFRAN) 4 MG tablet Take 4 mg by mouth daily as needed for nausea or vomiting.     polyethylene glycol (MIRALAX / GLYCOLAX) 17 g packet Take 17 g by mouth daily as needed. 14 each 0   pramipexole (MIRAPEX) 1 MG tablet TAKE 1 TABLET DAILY AT 4 P.M. 90 tablet 3   RABEprazole (ACIPHEX) 20 MG tablet Take 1 tablet (20 mg total) by mouth  daily. 90 tablet 3   rosuvastatin (CRESTOR) 40 MG tablet TAKE 1 TABLET DAILY 90 tablet 3   Semaglutide, 1 MG/DOSE, (OZEMPIC, 1 MG/DOSE,) 4 MG/3ML SOPN Inject 1 mg into the skin once a week.     sennosides-docusate sodium (SENOKOT-S) 8.6-50 MG tablet Take 1 tablet by mouth daily.     sodium chloride (MURO 128) 5 % ophthalmic solution 1 drop as needed for eye irritation.     tamsulosin (FLOMAX) 0.4 MG CAPS capsule TAKE 1 CAPSULE DAILY AFTER BREAKFAST 90 capsule  3   vitamin C (ASCORBIC ACID) 500 MG tablet Take 1,000 mg by mouth daily as needed.     No facility-administered medications prior to visit.    PAST MEDICAL HISTORY: Past Medical History:  Diagnosis Date   Anemia    Basal cell carcinoma 2018   R temple    Basal cell carcinoma of nose 2002   bridge of nose   BPH with obstruction/lower urinary tract symptoms    failed flomax, uroxatral, myrbetriq - sees urology [name]) pending videourodynamics   CKD (chronic kidney disease) stage 3, GFR 30-59 ml/min ([place]) 03/11/2012   Renal US 02/2014 - multiple kidney cysts    CVA (cerebral infarction) 03/07/2012   slurred speech; right hemplegia, left handed - completed PT 07/2014 (17 sessions)   Dehydration    Depression    mild, on cymbalta per prior PCP   Erectile dysfunction    per urology (prostaglandin injection)   GERD (gastroesophageal reflux disease)    with sliding HH   H/O hiatal hernia    History of kidney problems 12/2010   lacerated left kidney after fall   HLD (hyperlipidemia)    Hx of basal cell carcinoma 07/10/2017   R. lat. forehead near hair line   Kidney cysts 02/2014   bilateral (renal US)   Migraines 03/11/2012   now rare   OSA on CPAP    sleep study 01/2014 - severe, CPAP at 10, previously on nuvigil (Dr. [name])   Palpitations 01/2012   holter WNL, rare PAC/PVCs   Presbycusis of both ears 2016   rec amplification [facility])   Restless leg syndrome    well controlled on mirapex   Rosacea     [name]   Stroke [facility])    TIA (transient ischemic attack)    Type 2 diabetes, controlled, with neuropathy ([place]) 2011   [name] in [place]: Past Surgical History:  Procedure Laterality Date   ABI  2007   WNL   CARDIAC CATHETERIZATION  2004   normal; [place]   CARDIOVASCULAR STRESS TEST  02/2014   WNL, low risk scan, EF 68%   CAROTID ENDARTERECTOMY     CATARACT EXTRACTION W/ INTRAOCULAR LENS IMPLANT  11//2012 - 08/2011   COLONOSCOPY  03/2011   poor prep, unable to biopsy polyp in tic fold, rpt 3 mo [name])   COLONOSCOPY  06/2011   mult polyps adenomatous, severe diverticulosis, rpt 3 yrs [name])   CYSTOSCOPY WITH URETHRAL DILATATION N/A 07/24/2013   Procedure: CYSTOSCOPY WITH URETHRAL DILATATION;  Surgeon: [name], MD;  Location: [facility];  Service: Urology;  Laterality: N/A;   ESOPHAGOGASTRODUODENOSCOPY  2006   duodenitis, medual HH, Grade 2 reflux, mild gastritis   ESOPHAGOGASTRODUODENOSCOPY  07/2011   mild chronic gastritis   HEMORRHOID SURGERY  1991   INGUINAL HERNIA REPAIR  1981; 2002   left, then repaired   MRI brain  04/2010   WNL   PROSTATE SURGERY  02/2009   PVP (laser)   SKIN CANCER EXCISION  2002   bridge of nose, BCC   TONSILLECTOMY AND ADENOIDECTOMY  ~ [place] N/A 07/24/2013   Procedure: TRANSURETHRAL INCISION OF THE PROSTATE (TUIP);  Surgeon: [name], MD;  Location: [facility];  Service: Urology;  Laterality: N/A;   URETHRAL DILATION  2014   tannenbaum    FAMILY HISTORY: Family History  Problem Relation Age of Onset   Stroke Father  Lung cancer Father 51        smoker   Alcoholism Sister    Kidney disease Sister    Stroke Brother    Diabetes Brother 55   Lung cancer Brother    Coronary artery disease Neg Hx    Sleep apnea Neg Hx     SOCIAL HISTORY: Social History   Socioeconomic History   Marital status: Divorced    Spouse name: Not on file   Number of children: 2    Years of education: MD   Highest education level: Not on file  Occupational History   Occupation: retired    Fish farm manager: RETIRED  Tobacco Use   Smoking status: Never    Passive exposure: Never   Smokeless tobacco: Never   Tobacco comments:    "used to smoke pipe when I was youngerFinancial risk analyst Use: Never used  Substance and Sexual Activity   Alcohol use: Not Currently    Alcohol/week: 4.0 standard drinks of alcohol    Types: 2 Glasses of wine, 2 Shots of liquor per week    Comment: "nonexistent anymore"   Drug use: No   Sexual activity: Yes  Other Topics Concern   Not on file  Social History Narrative   Moved into [place] in [place] 12/2020   Caffeine: 1 cup coffee/day     Occupation: ophthalmologist, retired   Edu: MD   Activity: going to gym 3x/wk with trainer   Diet: some water, fruits/vegetables some, red meat a few times, fish 1x/wk   POA - [name] (Daughter)   Social Determinants of Health   Financial Resource Strain: [place]  (12/30/2020)   Overall Financial Resource Strain (CARDIA)    Difficulty of Paying Living Expenses: Not hard at all  Food Insecurity: Not on file  Transportation Needs: Not on file  Physical Activity: Not on file  Stress: Not on file  Social Connections: Not on file  Intimate Partner Violence: Not on file      PHYSICAL EXAM  Vitals:   12/05/22 1040  BP: 120/67  Pulse: 81  Weight: 202 lb (91.6 kg)  Height: 5\' 8"  (1.727 m)    Body mass index is 30.71 kg/m.  Generalized: Well developed, in no acute distress  Chest: Lungs clear to auscultation bilaterally  Neurological examination  Mentation: Alert oriented to time, place, history taking. Follows all commands speech and language fluent Cranial nerve II-XII: Extraocular movements were full, visual field were full on confrontational test Head turning and shoulder shrug  were normal and symmetric. Motor: The motor testing reveals 5 over 5  strength of all 4 extremities. Good symmetric motor tone is noted throughout.  Sensory: Sensory testing is intact to soft touch on all 4 extremities. No evidence of extinction is noted.  Gait and station: Gait is normal.    DIAGNOSTIC DATA (LABS, IMAGING, TESTING) - I reviewed patient records, labs, notes, testing and imaging myself where available.  Lab Results  Component Value Date   WBC 8.7 10/04/2022   HGB 13.8 10/04/2022   HCT 40.1 10/04/2022   MCV 88.4 10/04/2022   PLT 179.0 10/04/2022      Component Value Date/Time   NA 136 10/04/2022 1231   NA 139 12/05/2019 0000   K 3.8 10/04/2022 1231   CL 101 10/04/2022 1231   CO2 29 10/04/2022 1231   GLUCOSE 131 (H) 10/04/2022 1231   BUN 23 10/04/2022 1231   CREATININE 1.37 10/04/2022 1231  CALCIUM 8.8 10/04/2022 1231   PROT 7.1 10/04/2022 1231   ALBUMIN 3.6 10/04/2022 1231   AST 19 10/04/2022 1231   ALT 12 10/04/2022 1231   ALKPHOS 42 10/04/2022 1231   BILITOT 0.5 10/04/2022 1231   GFRNONAA 48 (L) 01/03/2022 0532   GFRAA 54 (L) 04/21/2020 1400   Lab Results  Component Value Date   CHOL 202 (H) 07/31/2022   HDL 17.60 (L) 07/31/2022   LDLCALC 53 12/05/2019   LDLDIRECT 126.0 07/31/2022   TRIG (H) 07/31/2022    410.0 Triglyceride is over 400; calculations on Lipids are invalid.   CHOLHDL 11 07/31/2022   Lab Results  Component Value Date   HGBA1C 7.9 (H) 07/31/2022   Lab Results  Component Value Date   [id number] 11/21/2021   Lab Results  Component Value Date   TSH 2.71 07/31/2022      ASSESSMENT AND PLAN 89 y.o. year old male  has a past medical history of Anemia, Basal cell carcinoma (2018), Basal cell carcinoma of nose (2002), BPH with obstruction/lower urinary tract symptoms, CKD (chronic kidney disease) stage 3, GFR 30-59 ml/min ([facility]) (03/11/2012), CVA (cerebral infarction) (03/07/2012), Dehydration, Depression, Erectile dysfunction, GERD (gastroesophageal reflux disease), H/O hiatal hernia, History of  kidney problems (12/2010), HLD (hyperlipidemia), basal cell carcinoma (07/10/2017), Kidney cysts (02/2014), Migraines (03/11/2012), OSA on CPAP, Palpitations (01/2012), Presbycusis of both ears (2016), Restless leg syndrome, Rosacea, Stroke ([place]), TIA (transient ischemic attack), and Type 2 diabetes, controlled, with neuropathy ([place]) (2011). here with:  OSA on CPAP  - CPAP compliance good. - Good treatment of AHI  - Encourage patient to use CPAP nightly and > 4 hours each night  2.  Restless leg syndrome  -Currently being managed by his primary care -I will reach out to them to his PCP and see  if they feel Neupro patch would be a good option for him - F/U in 1 year or sooner if needed     Ward Givens, MSN, NP-C 12/05/2022, 10:41 AM [facility] [address] [place], [place] [address] [phone]

## 2022-12-05 NOTE — Progress Notes (Signed)
CPAP supplies order sent to [facility]. Receipt of order confirmed by [facility].

## 2022-12-05 NOTE — Patient Instructions (Addendum)
Continue using CPAP nightly and greater than 4 hours each night Change out supplies  If your symptoms worsen or you develop new symptoms please let us know.

## 2022-12-06 DIAGNOSIS — R2689 Other abnormalities of gait and mobility: Secondary | ICD-10-CM | POA: Diagnosis not present

## 2022-12-06 DIAGNOSIS — R2681 Unsteadiness on feet: Secondary | ICD-10-CM | POA: Diagnosis not present

## 2022-12-06 DIAGNOSIS — Z9181 History of falling: Secondary | ICD-10-CM | POA: Diagnosis not present

## 2022-12-07 DIAGNOSIS — M6281 Muscle weakness (generalized): Secondary | ICD-10-CM | POA: Diagnosis not present

## 2022-12-08 DIAGNOSIS — R2689 Other abnormalities of gait and mobility: Secondary | ICD-10-CM | POA: Diagnosis not present

## 2022-12-08 DIAGNOSIS — Z9181 History of falling: Secondary | ICD-10-CM | POA: Diagnosis not present

## 2022-12-08 DIAGNOSIS — R2681 Unsteadiness on feet: Secondary | ICD-10-CM | POA: Diagnosis not present

## 2022-12-11 DIAGNOSIS — R2681 Unsteadiness on feet: Secondary | ICD-10-CM | POA: Diagnosis not present

## 2022-12-11 DIAGNOSIS — R2689 Other abnormalities of gait and mobility: Secondary | ICD-10-CM | POA: Diagnosis not present

## 2022-12-11 DIAGNOSIS — Z9181 History of falling: Secondary | ICD-10-CM | POA: Diagnosis not present

## 2022-12-12 DIAGNOSIS — M6281 Muscle weakness (generalized): Secondary | ICD-10-CM | POA: Diagnosis not present

## 2022-12-13 ENCOUNTER — Ambulatory Visit (INDEPENDENT_AMBULATORY_CARE_PROVIDER_SITE_OTHER): Payer: Medicare PPO | Admitting: Family

## 2022-12-13 ENCOUNTER — Encounter: Payer: Self-pay | Admitting: Family

## 2022-12-13 VITALS — BP 132/82 | HR 78 | Temp 97.9°F | Ht 68.0 in | Wt 201.2 lb

## 2022-12-13 DIAGNOSIS — J029 Acute pharyngitis, unspecified: Secondary | ICD-10-CM

## 2022-12-13 DIAGNOSIS — Z20822 Contact with and (suspected) exposure to covid-19: Secondary | ICD-10-CM

## 2022-12-13 DIAGNOSIS — J069 Acute upper respiratory infection, unspecified: Secondary | ICD-10-CM

## 2022-12-13 LAB — POCT RAPID STREP A (OFFICE): Rapid Strep A Screen: NEGATIVE

## 2022-12-13 LAB — POC COVID19 BINAXNOW: SARS Coronavirus 2 Ag: NEGATIVE

## 2022-12-13 NOTE — Progress Notes (Signed)
Established Patient Office Visit  Subjective:   Patient ID: James Sarks, MD, male    DOB: [birth year]  Age: 89 y.o. MRN: [id number]  CC:  Chief Complaint  Patient presents with   Cough    Clear mucus. Reflux started a few days ago which is causing hoarsness.    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 Reflux started a few days ago which is causing hoarsness.)   Cough    Three days ago started with hoarseness after he was experiencing heartburn when he was lying down. Since then has increased hoarseness. He does take pepcid regularly.   He does c/o sore throat with slight nasal congestion.  Has noticed some coughing with dry nonproductive, with some chest congestion and hearing rattles in his chest. He doesn't have shortness of breath, no fever.   He does live in assisted living and over the last two weeks 7 patients with covid positive.      ROS: Negative unless specifically indicated above in HPI.   Relevant past medical history reviewed and updated as indicated.   Allergies and medications reviewed and updated.   Current Outpatient Medications:    Alpha-Lipoic Acid 600 MG CAPS, Take 1 capsule (600 mg total) by mouth daily., Disp: 30 capsule, Rfl: 11   brimonidine-timolol (COMBIGAN) 0.2-0.5 % ophthalmic solution, Place 1 drop into both eyes every 12 (twelve) hours., Disp: , Rfl:    chlorpheniramine-HYDROcodone (TUSSIONEX) 10-8 MG/5ML, Take 5 mLs by mouth at bedtime as needed for cough., Disp: 70 mL, Rfl: 0   clopidogrel (PLAVIX) 75 MG tablet, TAKE 1 TABLET DAILY, Disp: 90 tablet, Rfl: 1   Coenzyme Q10 (COQ10) 150 MG CAPS, Take 1 capsule by mouth daily., Disp: , Rfl:    DULoxetine (CYMBALTA) 60 MG capsule, TAKE 1 CAPSULE DAILY, Disp: 90 capsule, Rfl: 3   famotidine (PEPCID) 20 MG tablet, Take 1 tablet (20 mg total) by mouth at bedtime., Disp: , Rfl:    fluconazole (DIFLUCAN) 150 MG tablet, Take 1 tablet (150 mg  total) by mouth once a week., Disp: 2 tablet, Rfl: 0   fluticasone (FLOVENT HFA) 110 MCG/ACT inhaler, Inhale 1 puff into the lungs in the morning and at bedtime., Disp: 1 each, Rfl: 1   gabapentin (NEURONTIN) 300 MG capsule, Take 2 capsules (600 mg total) by mouth 3 (three) times daily. 600 mg BID is his minimum dose without having breakthrough symptoms. He is aware 900 mg/day is recommended per kidney function., Disp: 540 capsule, Rfl: 1   insulin aspart (NOVOLOG) 100 UNIT/ML injection, CBG 70 - 120: 0 units  CBG 121 - 150: 1 unit  CBG 151 - 200: 2 units  CBG 201 - 250: 3 units  CBG 251 - 300: 5 units  CBG 301 - 350: 7 units  CBG 351 - 400: 9 units, Disp: 10 mL, Rfl: 11   insulin degludec (TRESIBA FLEXTOUCH) 200 UNIT/ML FlexTouch Pen, Inject 90 Units into the skin at bedtime., Disp: , Rfl:    Insulin Pen Needle (ULTICARE MICRO PEN NEEDLES) 31G X 8 MM MISC, Use to inject medication daily, Disp: 100 each, Rfl: 3   levothyroxine (SYNTHROID) 50 MCG tablet, Take 1 tablet (50 mcg total) by mouth daily before breakfast. Per endo Dr Sharl Ma, Disp: , Rfl:    mupirocin ointment (BACTROBAN) 2 %, Place 1 application. into the nose 2 (two) times daily., Disp: 22 g, Rfl: 0   nitroGLYCERIN (NITROSTAT) 0.4  MG SL tablet, DISSOLVE 1 TABLET UNDER THE TONGUE EVERY 5 MINUTES AS NEEDED FOR CHEST PAIN, Disp: 25 tablet, Rfl: 1   ondansetron (ZOFRAN) 4 MG tablet, Take 4 mg by mouth daily as needed for nausea or vomiting., Disp: , Rfl:    polyethylene glycol (MIRALAX / GLYCOLAX) 17 g packet, Take 17 g by mouth daily as needed., Disp: 14 each, Rfl: 0   pramipexole (MIRAPEX) 1 MG tablet, TAKE 1 TABLET DAILY AT 4 P.M., Disp: 90 tablet, Rfl: 3   RABEprazole (ACIPHEX) 20 MG tablet, Take 1 tablet (20 mg total) by mouth daily., Disp: 90 tablet, Rfl: 3   rosuvastatin (CRESTOR) 40 MG tablet, TAKE 1 TABLET DAILY, Disp: 90 tablet, Rfl: 3   Semaglutide, 1 MG/DOSE, (OZEMPIC, 1 MG/DOSE,) 4 MG/3ML SOPN, Inject 1 mg into the skin once a week.,  Disp: , Rfl:    sennosides-docusate sodium (SENOKOT-S) 8.6-50 MG tablet, Take 1 tablet by mouth daily., Disp: , Rfl:    sodium chloride (MURO 128) 5 % ophthalmic solution, 1 drop as needed for eye irritation., Disp: , Rfl:    tamsulosin (FLOMAX) 0.4 MG CAPS capsule, TAKE 1 CAPSULE DAILY AFTER BREAKFAST, Disp: 90 capsule, Rfl: 3   vitamin C (ASCORBIC ACID) 500 MG tablet, Take 1,000 mg by mouth daily as needed., Disp: , Rfl:   Allergies  Allergen Reactions   Dulaglutide     Other reaction(s): upset stomach Other reaction(s): Abdominal Pain, upset stomach Other reaction(s): upset stomach    Empagliflozin Other (See Comments)    Other reaction(s): yeast infection Other reaction(s): Other (See Comments) Other reaction(s): yeast infection    Lisinopril Cough    tachycardia   Nexium [Esomeprazole] Other (See Comments)    Headache   Oxycodone Other (See Comments)    Even 1/2 tablet of 5mg  caused increased confusion - contributory to hospitalization 10/2020 with AMS   Pregabalin Other (See Comments) and Nausea Only    Other reaction(s): MALAISE, SLIGHT NAUSEA, SLIGHT HEADACHE Other reaction(s): MALAISE, SLIGHT NAUSEA, SLIGHT HEADACHE    Rosuvastatin Other (See Comments)    Other reaction(s): muscle pain 2.16.2022 Pt is current on this medication without any issues. Other reaction(s): muscle pain 2.16.2022 Pt is current on this medication without any issues. Other reaction(s): muscle pain    Objective:   BP 132/82   Pulse 78   Temp 97.9 F (36.6 C) (Temporal)   Ht 5\' 8"  (1.727 m)   Wt 201 lb 3.2 oz (91.3 kg)   SpO2 98%   BMI 30.59 kg/m    Physical Exam Vitals reviewed.  Constitutional:      General: He is not in acute distress.    Appearance: Normal appearance. He is obese. He is not ill-appearing, toxic-appearing or diaphoretic.  HENT:     Head: Normocephalic.     Right Ear: Tympanic membrane normal.     Left Ear: Tympanic membrane normal.     Nose: Nose normal.      Mouth/Throat:     Mouth: Mucous membranes are moist.     Pharynx: Posterior oropharyngeal erythema present.  Eyes:     Pupils: Pupils are equal, round, and reactive to light.  Cardiovascular:     Rate and Rhythm: Normal rate and regular rhythm.  Pulmonary:     Effort: Pulmonary effort is normal.     Breath sounds: Normal breath sounds. No wheezing.  Musculoskeletal:        General: Normal range of motion.     Cervical back: Normal  range of motion.  Neurological:     General: No focal deficit present.     Mental Status: He is alert and oriented to person, place, and time. Mental status is at baseline.  Psychiatric:        Mood and Affect: Mood normal.        Behavior: Behavior normal.        Thought Content: Thought content normal.        Judgment: Judgment normal.     Assessment & Plan:  Sore throat -     POCT rapid strep A  Contact with and (suspected) exposure to covid-19 -     POC COVID-19 BinaxNow  Viral upper respiratory illness Assessment & Plan: Covid strep and flu negative. Suspect viral in nature.  Advised patient on supportive measures:  Be sure to rest, drink plenty of fluids, and use tylenol or as needed for pain/fever. Follow up if fever >101, if symptoms worsen or if symptoms are not improved in 3 days. Patient verbalizes understanding.            Follow up plan: Return for f/u with primary care provider if no improvement.  Mort Sawyers, FNP

## 2022-12-14 DIAGNOSIS — J069 Acute upper respiratory infection, unspecified: Secondary | ICD-10-CM | POA: Insufficient documentation

## 2022-12-14 DIAGNOSIS — M6281 Muscle weakness (generalized): Secondary | ICD-10-CM | POA: Diagnosis not present

## 2022-12-14 NOTE — Assessment & Plan Note (Signed)
Covid strep and flu negative. Suspect viral in nature.  Advised patient on supportive measures:  Be sure to rest, drink plenty of fluids, and use tylenol or as needed for pain/fever. Follow up if fever >101, if symptoms worsen or if symptoms are not improved in 3 days. Patient verbalizes understanding.

## 2022-12-14 NOTE — Patient Instructions (Signed)
Be sure to rest, drink plenty of fluids, and use tylenol as needed for pain. Follow up if fever >101, if symptoms worsen or if symptoms are not improved in 3 days.   You can try a few things over the counter to help with your symptoms including:  Cough: Delsym or Robitussin (get the off brand, works just as well) Chest Congestion: Mucinex (plain, not DM) Nasal Congestion/Ear Pressure/Sinus Pressure: Try using Flonase (fluticasone) nasal spray. This can be purchased over the counter. Body aches, fevers, headache: Ibuprofen (not to exceed 2400 mg in 24 hours) or Acetaminophen-Tylenol (not to exceed 3000 mg in 24 hours) Runny Nose/Throat Drainage/Sneezing/Itchy or Watery Eyes: An antihistamine such as Zyrtec, Claritin, Xyzal, Allegra  You should be feeling better by day seven of symptoms, but please do contact me if this is not the case.

## 2022-12-15 DIAGNOSIS — Z9181 History of falling: Secondary | ICD-10-CM | POA: Diagnosis not present

## 2022-12-15 DIAGNOSIS — R2689 Other abnormalities of gait and mobility: Secondary | ICD-10-CM | POA: Diagnosis not present

## 2022-12-15 DIAGNOSIS — R2681 Unsteadiness on feet: Secondary | ICD-10-CM | POA: Diagnosis not present

## 2022-12-18 ENCOUNTER — Other Ambulatory Visit: Payer: Self-pay | Admitting: Family Medicine

## 2022-12-18 DIAGNOSIS — Z9181 History of falling: Secondary | ICD-10-CM | POA: Diagnosis not present

## 2022-12-18 DIAGNOSIS — M6281 Muscle weakness (generalized): Secondary | ICD-10-CM | POA: Diagnosis not present

## 2022-12-18 DIAGNOSIS — R2681 Unsteadiness on feet: Secondary | ICD-10-CM | POA: Diagnosis not present

## 2022-12-18 DIAGNOSIS — R2689 Other abnormalities of gait and mobility: Secondary | ICD-10-CM | POA: Diagnosis not present

## 2022-12-19 DIAGNOSIS — M6281 Muscle weakness (generalized): Secondary | ICD-10-CM | POA: Diagnosis not present

## 2022-12-19 DIAGNOSIS — E1165 Type 2 diabetes mellitus with hyperglycemia: Secondary | ICD-10-CM | POA: Diagnosis not present

## 2022-12-19 DIAGNOSIS — M25472 Effusion, left ankle: Secondary | ICD-10-CM | POA: Diagnosis not present

## 2022-12-19 DIAGNOSIS — M25572 Pain in left ankle and joints of left foot: Secondary | ICD-10-CM | POA: Diagnosis not present

## 2022-12-20 DIAGNOSIS — R2681 Unsteadiness on feet: Secondary | ICD-10-CM | POA: Diagnosis not present

## 2022-12-20 DIAGNOSIS — R2689 Other abnormalities of gait and mobility: Secondary | ICD-10-CM | POA: Diagnosis not present

## 2022-12-20 DIAGNOSIS — Z9181 History of falling: Secondary | ICD-10-CM | POA: Diagnosis not present

## 2022-12-21 ENCOUNTER — Other Ambulatory Visit: Payer: Self-pay

## 2022-12-21 ENCOUNTER — Emergency Department: Payer: Medicare PPO

## 2022-12-21 ENCOUNTER — Emergency Department
Admission: EM | Admit: 2022-12-21 | Discharge: 2022-12-21 | Disposition: A | Discharge status: Home / Self Care | Payer: Medicare PPO | Attending: Emergency Medicine | Admitting: Emergency Medicine

## 2022-12-21 DIAGNOSIS — M25561 Pain in right knee: Secondary | ICD-10-CM | POA: Diagnosis not present

## 2022-12-21 DIAGNOSIS — Y92002 Bathroom of unspecified non-institutional (private) residence single-family (private) house as the place of occurrence of the external cause: Secondary | ICD-10-CM | POA: Insufficient documentation

## 2022-12-21 DIAGNOSIS — M6281 Muscle weakness (generalized): Secondary | ICD-10-CM | POA: Diagnosis not present

## 2022-12-21 DIAGNOSIS — I129 Hypertensive chronic kidney disease with stage 1 through stage 4 chronic kidney disease, or unspecified chronic kidney disease: Secondary | ICD-10-CM | POA: Diagnosis not present

## 2022-12-21 DIAGNOSIS — W19XXXA Unspecified fall, initial encounter: Secondary | ICD-10-CM | POA: Insufficient documentation

## 2022-12-21 DIAGNOSIS — M25571 Pain in right ankle and joints of right foot: Secondary | ICD-10-CM | POA: Diagnosis not present

## 2022-12-21 DIAGNOSIS — N189 Chronic kidney disease, unspecified: Secondary | ICD-10-CM | POA: Diagnosis not present

## 2022-12-21 DIAGNOSIS — Z7901 Long term (current) use of anticoagulants: Secondary | ICD-10-CM | POA: Insufficient documentation

## 2022-12-21 DIAGNOSIS — S199XXA Unspecified injury of neck, initial encounter: Secondary | ICD-10-CM | POA: Diagnosis not present

## 2022-12-21 DIAGNOSIS — I959 Hypotension, unspecified: Secondary | ICD-10-CM | POA: Diagnosis not present

## 2022-12-21 DIAGNOSIS — Y92009 Unspecified place in unspecified non-institutional (private) residence as the place of occurrence of the external cause: Secondary | ICD-10-CM

## 2022-12-21 DIAGNOSIS — M79661 Pain in right lower leg: Secondary | ICD-10-CM | POA: Insufficient documentation

## 2022-12-21 DIAGNOSIS — E119 Type 2 diabetes mellitus without complications: Secondary | ICD-10-CM | POA: Diagnosis not present

## 2022-12-21 DIAGNOSIS — S0990XA Unspecified injury of head, initial encounter: Secondary | ICD-10-CM | POA: Insufficient documentation

## 2022-12-21 DIAGNOSIS — Z8673 Personal history of transient ischemic attack (TIA), and cerebral infarction without residual deficits: Secondary | ICD-10-CM | POA: Diagnosis not present

## 2022-12-21 DIAGNOSIS — S80919A Unspecified superficial injury of unspecified knee, initial encounter: Secondary | ICD-10-CM | POA: Diagnosis not present

## 2022-12-21 DIAGNOSIS — M79604 Pain in right leg: Secondary | ICD-10-CM

## 2022-12-21 DIAGNOSIS — M1711 Unilateral primary osteoarthritis, right knee: Secondary | ICD-10-CM | POA: Diagnosis not present

## 2022-12-21 DIAGNOSIS — R2689 Other abnormalities of gait and mobility: Secondary | ICD-10-CM | POA: Diagnosis not present

## 2022-12-21 DIAGNOSIS — Z9181 History of falling: Secondary | ICD-10-CM | POA: Diagnosis not present

## 2022-12-21 DIAGNOSIS — Z043 Encounter for examination and observation following other accident: Secondary | ICD-10-CM | POA: Diagnosis not present

## 2022-12-21 DIAGNOSIS — R2681 Unsteadiness on feet: Secondary | ICD-10-CM | POA: Diagnosis not present

## 2022-12-21 MED ORDER — HYDROCODONE-ACETAMINOPHEN 5-325 MG PO TABS
1.0000 | ORAL_TABLET | Freq: Once | ORAL | Status: AC
  Administered 2022-12-21: 1 via ORAL
  Filled 2022-12-21: qty 1

## 2022-12-21 MED ORDER — HYDROCODONE-ACETAMINOPHEN 5-325 MG PO TABS: 1.0000 | ORAL_TABLET | Freq: Three times a day (TID) | ORAL | 0 refills | Status: AC | PRN

## 2022-12-21 MED ORDER — GABAPENTIN 300 MG PO CAPS
600.0000 mg | ORAL_CAPSULE | Freq: Once | ORAL | Status: AC
  Administered 2022-12-21: 600 mg via ORAL
  Filled 2022-12-21: qty 2

## 2022-12-21 NOTE — ED Triage Notes (Signed)
Arrives from [facility] via [place].  Patient had an unwitnessed fall in [place].  C/O right knee pain.  Denies LOC.  Takes blood thinner.  Patient states right knee gave out.  VS wnl.  CBG:  187.;

## 2022-12-21 NOTE — ED Provider Notes (Signed)
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  Patient reports he was in his bathroom at his facility, when he lost his balance standing up to pull his pants up.  He denies any frank head injury, but notes his right leg gave out, noting knee pain and now right ankle pain.  Patient wears an AFO on that right leg due to his weakness. He presents via EMS for evaluation of his injuries.  Physical Exam   Triage Vital Signs: ED Triage Vitals  Enc Vitals Group     BP 12/21/22 1423 137/62     Pulse Rate 12/21/22 1423 69     Resp 12/21/22 1423 18     Temp 12/21/22 1423 97.8 F (36.6 C)     Temp Source 12/21/22 1423 Oral     SpO2 12/21/22 1423 100 %     Weight 12/21/22 1419 201 lb 1 oz (91.2 kg)     Height 12/21/22 1419 5\' 8"  (1.727 m)     Head Circumference --      Peak Flow --      Pain Score 12/21/22 1420 6     Pain Loc --      Pain Edu? --      Excl. in [place]? --     Most recent vital signs: Vitals:   12/21/22 1423 12/21/22 1800  BP: 137/62 138/64  Pulse: 69 72  Resp: 18 18  Temp: 97.8 F (36.6 C) 97.8 F (36.6 C)  SpO2: 100% 98%    General Awake, no distress. NAD CV:  Good peripheral perfusion.  RESP:  Normal effort.  ABD:  No distention.  MSK:  Hemiparesis noted to the right upper and lower extremities.  AFO in place to the right ankle.  Normal active range of motion to the right knee.  Right ankle and foot without obvious deformity or dislocation.  Subtle soft tissue swelling noted distally.  Patient without any significant tenderness to palpation to the medial /lateral malleoli.  No calf or Achilles tenderness elicited.  ED Results / Procedures / Treatments   Labs (all  labs ordered are listed, but only abnormal results are displayed) Labs Reviewed - No data to display   EKG   RADIOLOGY  I personally viewed and evaluated these images as part of my medical decision making, as well as reviewing the written report by the radiologist.  ED Provider Interpretation: Acute findings.  DG Ankle Complete Right  Result Date: 12/21/2022 CLINICAL DATA:  Status post fall.  Hemiparesis. EXAM: RIGHT ANKLE - COMPLETE 3+ VIEW COMPARISON:  None Available. FINDINGS: Medial greater than lateral soft tissue fullness could be due to swelling or patient body habitus. Tiny degenerative cyst in the talar dome. Base of fifth metatarsal intact. Subtle osseous irregularity about the medial malleolus on the oblique image only. IMPRESSION: Subtle osseous irregularity about the medial malleolus, warranting physical exam correlation. If point tenderness in this area, nondisplaced fracture would be a concern. Bimalleolar soft tissue fullness could be posttraumatic or related to patient body habitus. Electronically Signed   By: Abigail Miyamoto M.D.   On: 12/21/2022 15:54   CT Cervical Spine Wo  Contrast  Result Date: 12/21/2022 CLINICAL DATA:  Trauma EXAM: CT CERVICAL SPINE WITHOUT CONTRAST TECHNIQUE: Multidetector CT imaging of the cervical spine was performed without intravenous contrast. Multiplanar CT image reconstructions were also generated. RADIATION DOSE REDUCTION: This exam was performed according to the departmental dose-optimization program which includes automated exposure control, adjustment of the mA and/or kV according to patient size and/or use of iterative reconstruction technique. COMPARISON:  03/31/2021 FINDINGS: Alignment: Normal. Skull base and vertebrae: No acute fracture. No primary bone lesion or focal pathologic process. Soft tissues and spinal canal: No prevertebral fluid or swelling. No visible canal hematoma. Disc levels: Disc space narrowing with marginal osteophyte formation  identified most severely involving C4-5 through C7-T1. Facet joint degenerative changes identified from C3-4 through C7-T1. Upper chest: Nonspecific interstitial prominence and pleural effusion or thickening. IMPRESSION: Degenerative changes.  No acute traumatic abnormalities identified. Electronically Signed   By: Sammie Bench M.D.   On: 12/21/2022 15:48   CT HEAD WO CONTRAST (5MM)  Result Date: 12/21/2022 CLINICAL DATA:  Head trauma EXAM: CT HEAD WITHOUT CONTRAST TECHNIQUE: Contiguous axial images were obtained from the base of the skull through the vertex without intravenous contrast. RADIATION DOSE REDUCTION: This exam was performed according to the departmental dose-optimization program which includes automated exposure control, adjustment of the mA and/or kV according to patient size and/or use of iterative reconstruction technique. COMPARISON:  01/01/2022 FINDINGS: Brain: There is periventricular white matter decreased attenuation consistent with small vessel ischemic changes. Ventricles, sulci and cisterns are prominent consistent with age related involutional changes. No acute intracranial hemorrhage, mass effect or shift. No hydrocephalus. Left periventricular encephalomalacia consistent with a chronic CVA. Vascular: No hyperdense vessel or unexpected calcification. Skull: Normal. Negative for fracture or focal lesion. Sinuses/Orbits: No acute finding. IMPRESSION: Atrophy and chronic small vessel ischemic changes. Chronic left-sided CVA. No acute intracranial process identified. Electronically Signed   By: Sammie Bench M.D.   On: 12/21/2022 15:42   DG Knee Complete 4 Views Right  Result Date: 12/21/2022 CLINICAL DATA:  Fall.  Pain EXAM: RIGHT KNEE - COMPLETE 4+ VIEW COMPARISON:  Right knee radiographs 08/01/2021 FINDINGS: Mild-to-moderate medial compartment joint space narrowing. Mild chronic enthesopathic change at the quadriceps insertion on the patella. No joint effusion. No acute fracture  or dislocation. IMPRESSION: Mild-to-moderate medial compartment osteoarthritis. Electronically Signed   By: Yvonne Kendall M.D.   On: 12/21/2022 15:20     PROCEDURES:  Critical Care performed: No  Procedures   MEDICATIONS ORDERED IN ED: Medications  HYDROcodone-acetaminophen (NORCO/VICODIN) 5-325 MG per tablet 1 tablet (1 tablet Oral Given 12/21/22 1604)  gabapentin (NEURONTIN) capsule 600 mg (600 mg Oral Given 12/21/22 1716)     IMPRESSION / MDM / ASSESSMENT AND PLAN / ED COURSE  I reviewed the triage vital signs and the nursing notes.                              Differential diagnosis includes, but is not limited to, closed head injury, SDH, cervical fracture, cervical radiculopathy, knee fracture, dislocation, ankle fracture, ankle dislocation  Patient's presentation is most consistent with acute complicated illness / injury requiring diagnostic workup.  Patient's diagnosis is consistent with mechanical fall at home, resulting in right leg pain.  No radiologic evidence of any SDH or cervical fracture based on interpretation of CTs.  Plain films of the right knee and ankle also negative and reassuring for any acute fracture or dislocation.  Patient without  any palpable medial joint tenderness to the ankle, to suggest a nondisplaced fracture.  Patient will be discharged home with prescriptions for Vicodin (#9). Patient is to follow up with his primary provider as needed or otherwise directed. Patient is given ED precautions to return to the ED for any worsening or new symptoms.  FINAL CLINICAL IMPRESSION(S) / ED DIAGNOSES   Final diagnoses:  Fall in home, initial encounter  Pain of right lower extremity     Rx / DC Orders   ED Discharge Orders          Ordered    HYDROcodone-acetaminophen (NORCO) 5-325 MG tablet  3 times daily PRN        12/21/22 1753             Note:  This document was prepared using Dragon voice recognition software and may include unintentional  dictation errors.    Lissa Hoard, PA-C 12/21/22 2030    Georga Hacking, MD 12/23/22 [phone]

## 2022-12-21 NOTE — Discharge Instructions (Addendum)
Your exam, x-rays, and CT scans are normal and reassuring following your mechanical fall.  No evidence of closed head injury or bony fracture or dislocation to the right lower extremity.  Take the prescription pain medicine as needed.  Follow-up with your primary provider for ongoing symptoms.  Return to the ED needed.

## 2022-12-21 NOTE — ED Notes (Signed)
Pt able to transfer/pivot from wheelchair to bathroom toilet. Pt also able to stand from wheelchair using walker.

## 2022-12-25 DIAGNOSIS — Z9181 History of falling: Secondary | ICD-10-CM | POA: Diagnosis not present

## 2022-12-25 DIAGNOSIS — M6281 Muscle weakness (generalized): Secondary | ICD-10-CM | POA: Diagnosis not present

## 2022-12-25 DIAGNOSIS — R2681 Unsteadiness on feet: Secondary | ICD-10-CM | POA: Diagnosis not present

## 2022-12-25 DIAGNOSIS — R2689 Other abnormalities of gait and mobility: Secondary | ICD-10-CM | POA: Diagnosis not present

## 2022-12-26 DIAGNOSIS — M6281 Muscle weakness (generalized): Secondary | ICD-10-CM | POA: Diagnosis not present

## 2022-12-27 ENCOUNTER — Telehealth: Payer: Self-pay

## 2022-12-27 DIAGNOSIS — R2689 Other abnormalities of gait and mobility: Secondary | ICD-10-CM | POA: Diagnosis not present

## 2022-12-27 DIAGNOSIS — R2681 Unsteadiness on feet: Secondary | ICD-10-CM | POA: Diagnosis not present

## 2022-12-27 DIAGNOSIS — Z9181 History of falling: Secondary | ICD-10-CM | POA: Diagnosis not present

## 2022-12-27 NOTE — Telephone Encounter (Signed)
        Patient  visited [place] on 4/4   Telephone encounter attempt :  2nd  A HIPAA compliant voice message was left requesting a return call.  Instructed patient to call back .   [facility], [place] [phone] [address], , [place] [address] Phone: [phone] Email: [name]       

## 2022-12-27 NOTE — Telephone Encounter (Signed)
        Patient  visited [place] on 4/4    Telephone encounter attempt :  1st  A HIPAA compliant voice message was left requesting a return call.  Instructed patient to call back .    Thales Knipple [facility], [place] [phone] [address], [place], [place] [address] Phone: [phone] Email: [email or web address]       

## 2022-12-28 DIAGNOSIS — S82844A Nondisplaced bimalleolar fracture of right lower leg, initial encounter for closed fracture: Secondary | ICD-10-CM | POA: Diagnosis not present

## 2022-12-28 DIAGNOSIS — S8391XA Sprain of unspecified site of right knee, initial encounter: Secondary | ICD-10-CM | POA: Diagnosis not present

## 2022-12-29 DIAGNOSIS — R2689 Other abnormalities of gait and mobility: Secondary | ICD-10-CM | POA: Diagnosis not present

## 2022-12-29 DIAGNOSIS — R2681 Unsteadiness on feet: Secondary | ICD-10-CM | POA: Diagnosis not present

## 2022-12-29 DIAGNOSIS — Z9181 History of falling: Secondary | ICD-10-CM | POA: Diagnosis not present

## 2022-12-29 DIAGNOSIS — M6281 Muscle weakness (generalized): Secondary | ICD-10-CM | POA: Diagnosis not present

## 2023-01-01 DIAGNOSIS — R2681 Unsteadiness on feet: Secondary | ICD-10-CM | POA: Diagnosis not present

## 2023-01-01 DIAGNOSIS — R2689 Other abnormalities of gait and mobility: Secondary | ICD-10-CM | POA: Diagnosis not present

## 2023-01-01 DIAGNOSIS — Z9181 History of falling: Secondary | ICD-10-CM | POA: Diagnosis not present

## 2023-01-01 DIAGNOSIS — M6281 Muscle weakness (generalized): Secondary | ICD-10-CM | POA: Diagnosis not present

## 2023-01-02 DIAGNOSIS — M6281 Muscle weakness (generalized): Secondary | ICD-10-CM | POA: Diagnosis not present

## 2023-01-03 DIAGNOSIS — R2689 Other abnormalities of gait and mobility: Secondary | ICD-10-CM | POA: Diagnosis not present

## 2023-01-03 DIAGNOSIS — R2681 Unsteadiness on feet: Secondary | ICD-10-CM | POA: Diagnosis not present

## 2023-01-03 DIAGNOSIS — Z9181 History of falling: Secondary | ICD-10-CM | POA: Diagnosis not present

## 2023-01-04 DIAGNOSIS — M6281 Muscle weakness (generalized): Secondary | ICD-10-CM | POA: Diagnosis not present

## 2023-01-05 DIAGNOSIS — Z9181 History of falling: Secondary | ICD-10-CM | POA: Diagnosis not present

## 2023-01-05 DIAGNOSIS — G4733 Obstructive sleep apnea (adult) (pediatric): Secondary | ICD-10-CM | POA: Diagnosis not present

## 2023-01-05 DIAGNOSIS — R2689 Other abnormalities of gait and mobility: Secondary | ICD-10-CM | POA: Diagnosis not present

## 2023-01-05 DIAGNOSIS — R2681 Unsteadiness on feet: Secondary | ICD-10-CM | POA: Diagnosis not present

## 2023-01-08 DIAGNOSIS — M6281 Muscle weakness (generalized): Secondary | ICD-10-CM | POA: Diagnosis not present

## 2023-01-08 DIAGNOSIS — Z9181 History of falling: Secondary | ICD-10-CM | POA: Diagnosis not present

## 2023-01-08 DIAGNOSIS — R2681 Unsteadiness on feet: Secondary | ICD-10-CM | POA: Diagnosis not present

## 2023-01-08 DIAGNOSIS — R2689 Other abnormalities of gait and mobility: Secondary | ICD-10-CM | POA: Diagnosis not present

## 2023-01-09 DIAGNOSIS — M6281 Muscle weakness (generalized): Secondary | ICD-10-CM | POA: Diagnosis not present

## 2023-01-10 DIAGNOSIS — G8190 Hemiplegia, unspecified affecting unspecified side: Secondary | ICD-10-CM | POA: Diagnosis not present

## 2023-01-10 DIAGNOSIS — R2681 Unsteadiness on feet: Secondary | ICD-10-CM | POA: Diagnosis not present

## 2023-01-10 DIAGNOSIS — S82434A Nondisplaced oblique fracture of shaft of right fibula, initial encounter for closed fracture: Secondary | ICD-10-CM | POA: Diagnosis not present

## 2023-01-10 DIAGNOSIS — I890 Lymphedema, not elsewhere classified: Secondary | ICD-10-CM | POA: Diagnosis not present

## 2023-01-10 DIAGNOSIS — E1142 Type 2 diabetes mellitus with diabetic polyneuropathy: Secondary | ICD-10-CM | POA: Diagnosis not present

## 2023-01-10 DIAGNOSIS — B351 Tinea unguium: Secondary | ICD-10-CM | POA: Diagnosis not present

## 2023-01-10 DIAGNOSIS — L603 Nail dystrophy: Secondary | ICD-10-CM | POA: Diagnosis not present

## 2023-01-10 DIAGNOSIS — Z9181 History of falling: Secondary | ICD-10-CM | POA: Diagnosis not present

## 2023-01-10 DIAGNOSIS — R2689 Other abnormalities of gait and mobility: Secondary | ICD-10-CM | POA: Diagnosis not present

## 2023-01-10 DIAGNOSIS — L97522 Non-pressure chronic ulcer of other part of left foot with fat layer exposed: Secondary | ICD-10-CM | POA: Diagnosis not present

## 2023-01-11 DIAGNOSIS — M6281 Muscle weakness (generalized): Secondary | ICD-10-CM | POA: Diagnosis not present

## 2023-01-15 DIAGNOSIS — M6281 Muscle weakness (generalized): Secondary | ICD-10-CM | POA: Diagnosis not present

## 2023-01-17 DIAGNOSIS — M6281 Muscle weakness (generalized): Secondary | ICD-10-CM | POA: Diagnosis not present

## 2023-01-17 DIAGNOSIS — R2689 Other abnormalities of gait and mobility: Secondary | ICD-10-CM | POA: Diagnosis not present

## 2023-01-17 DIAGNOSIS — R2681 Unsteadiness on feet: Secondary | ICD-10-CM | POA: Diagnosis not present

## 2023-01-17 DIAGNOSIS — M25571 Pain in right ankle and joints of right foot: Secondary | ICD-10-CM | POA: Diagnosis not present

## 2023-01-17 DIAGNOSIS — Z9181 History of falling: Secondary | ICD-10-CM | POA: Diagnosis not present

## 2023-01-18 DIAGNOSIS — M6281 Muscle weakness (generalized): Secondary | ICD-10-CM | POA: Diagnosis not present

## 2023-01-18 DIAGNOSIS — E1165 Type 2 diabetes mellitus with hyperglycemia: Secondary | ICD-10-CM | POA: Diagnosis not present

## 2023-01-19 DIAGNOSIS — Z9181 History of falling: Secondary | ICD-10-CM | POA: Diagnosis not present

## 2023-01-19 DIAGNOSIS — R2681 Unsteadiness on feet: Secondary | ICD-10-CM | POA: Diagnosis not present

## 2023-01-19 DIAGNOSIS — R2689 Other abnormalities of gait and mobility: Secondary | ICD-10-CM | POA: Diagnosis not present

## 2023-01-22 DIAGNOSIS — M6281 Muscle weakness (generalized): Secondary | ICD-10-CM | POA: Diagnosis not present

## 2023-01-22 DIAGNOSIS — M25572 Pain in left ankle and joints of left foot: Secondary | ICD-10-CM | POA: Diagnosis not present

## 2023-01-23 DIAGNOSIS — Z9181 History of falling: Secondary | ICD-10-CM | POA: Diagnosis not present

## 2023-01-23 DIAGNOSIS — R2689 Other abnormalities of gait and mobility: Secondary | ICD-10-CM | POA: Diagnosis not present

## 2023-01-23 DIAGNOSIS — R2681 Unsteadiness on feet: Secondary | ICD-10-CM | POA: Diagnosis not present

## 2023-01-24 DIAGNOSIS — M6281 Muscle weakness (generalized): Secondary | ICD-10-CM | POA: Diagnosis not present

## 2023-01-25 DIAGNOSIS — R2681 Unsteadiness on feet: Secondary | ICD-10-CM | POA: Diagnosis not present

## 2023-01-25 DIAGNOSIS — R2689 Other abnormalities of gait and mobility: Secondary | ICD-10-CM | POA: Diagnosis not present

## 2023-01-25 DIAGNOSIS — Z9181 History of falling: Secondary | ICD-10-CM | POA: Diagnosis not present

## 2023-01-26 DIAGNOSIS — M6281 Muscle weakness (generalized): Secondary | ICD-10-CM | POA: Diagnosis not present

## 2023-01-29 DIAGNOSIS — L97522 Non-pressure chronic ulcer of other part of left foot with fat layer exposed: Secondary | ICD-10-CM | POA: Diagnosis not present

## 2023-01-29 DIAGNOSIS — R2681 Unsteadiness on feet: Secondary | ICD-10-CM | POA: Diagnosis not present

## 2023-01-29 DIAGNOSIS — E1142 Type 2 diabetes mellitus with diabetic polyneuropathy: Secondary | ICD-10-CM | POA: Diagnosis not present

## 2023-01-29 DIAGNOSIS — L97511 Non-pressure chronic ulcer of other part of right foot limited to breakdown of skin: Secondary | ICD-10-CM | POA: Diagnosis not present

## 2023-01-29 DIAGNOSIS — G8190 Hemiplegia, unspecified affecting unspecified side: Secondary | ICD-10-CM | POA: Diagnosis not present

## 2023-01-29 DIAGNOSIS — S82434A Nondisplaced oblique fracture of shaft of right fibula, initial encounter for closed fracture: Secondary | ICD-10-CM | POA: Diagnosis not present

## 2023-01-29 DIAGNOSIS — L03032 Cellulitis of left toe: Secondary | ICD-10-CM | POA: Diagnosis not present

## 2023-01-29 DIAGNOSIS — B351 Tinea unguium: Secondary | ICD-10-CM | POA: Diagnosis not present

## 2023-01-29 DIAGNOSIS — R2689 Other abnormalities of gait and mobility: Secondary | ICD-10-CM | POA: Diagnosis not present

## 2023-01-29 DIAGNOSIS — I890 Lymphedema, not elsewhere classified: Secondary | ICD-10-CM | POA: Diagnosis not present

## 2023-01-29 DIAGNOSIS — Z9181 History of falling: Secondary | ICD-10-CM | POA: Diagnosis not present

## 2023-01-29 DIAGNOSIS — M6281 Muscle weakness (generalized): Secondary | ICD-10-CM | POA: Diagnosis not present

## 2023-01-29 DIAGNOSIS — L603 Nail dystrophy: Secondary | ICD-10-CM | POA: Diagnosis not present

## 2023-01-31 DIAGNOSIS — M6281 Muscle weakness (generalized): Secondary | ICD-10-CM | POA: Diagnosis not present

## 2023-02-01 DIAGNOSIS — M6281 Muscle weakness (generalized): Secondary | ICD-10-CM | POA: Diagnosis not present

## 2023-02-01 DIAGNOSIS — R2681 Unsteadiness on feet: Secondary | ICD-10-CM | POA: Diagnosis not present

## 2023-02-01 DIAGNOSIS — R2689 Other abnormalities of gait and mobility: Secondary | ICD-10-CM | POA: Diagnosis not present

## 2023-02-01 DIAGNOSIS — Z9181 History of falling: Secondary | ICD-10-CM | POA: Diagnosis not present

## 2023-02-04 DIAGNOSIS — G4733 Obstructive sleep apnea (adult) (pediatric): Secondary | ICD-10-CM | POA: Diagnosis not present

## 2023-02-05 DIAGNOSIS — E1142 Type 2 diabetes mellitus with diabetic polyneuropathy: Secondary | ICD-10-CM | POA: Diagnosis not present

## 2023-02-05 DIAGNOSIS — L97522 Non-pressure chronic ulcer of other part of left foot with fat layer exposed: Secondary | ICD-10-CM | POA: Diagnosis not present

## 2023-02-06 DIAGNOSIS — R2681 Unsteadiness on feet: Secondary | ICD-10-CM | POA: Diagnosis not present

## 2023-02-06 DIAGNOSIS — R2689 Other abnormalities of gait and mobility: Secondary | ICD-10-CM | POA: Diagnosis not present

## 2023-02-06 DIAGNOSIS — Z9181 History of falling: Secondary | ICD-10-CM | POA: Diagnosis not present

## 2023-02-07 DIAGNOSIS — M6281 Muscle weakness (generalized): Secondary | ICD-10-CM | POA: Diagnosis not present

## 2023-02-08 DIAGNOSIS — R2689 Other abnormalities of gait and mobility: Secondary | ICD-10-CM | POA: Diagnosis not present

## 2023-02-08 DIAGNOSIS — Z9181 History of falling: Secondary | ICD-10-CM | POA: Diagnosis not present

## 2023-02-08 DIAGNOSIS — R2681 Unsteadiness on feet: Secondary | ICD-10-CM | POA: Diagnosis not present

## 2023-02-09 DIAGNOSIS — M6281 Muscle weakness (generalized): Secondary | ICD-10-CM | POA: Diagnosis not present

## 2023-02-12 DIAGNOSIS — M6281 Muscle weakness (generalized): Secondary | ICD-10-CM | POA: Diagnosis not present

## 2023-02-13 DIAGNOSIS — Z9181 History of falling: Secondary | ICD-10-CM | POA: Diagnosis not present

## 2023-02-13 DIAGNOSIS — R2689 Other abnormalities of gait and mobility: Secondary | ICD-10-CM | POA: Diagnosis not present

## 2023-02-13 DIAGNOSIS — R2681 Unsteadiness on feet: Secondary | ICD-10-CM | POA: Diagnosis not present

## 2023-02-14 DIAGNOSIS — M6281 Muscle weakness (generalized): Secondary | ICD-10-CM | POA: Diagnosis not present

## 2023-02-15 DIAGNOSIS — Z9181 History of falling: Secondary | ICD-10-CM | POA: Diagnosis not present

## 2023-02-15 DIAGNOSIS — R2689 Other abnormalities of gait and mobility: Secondary | ICD-10-CM | POA: Diagnosis not present

## 2023-02-15 DIAGNOSIS — R2681 Unsteadiness on feet: Secondary | ICD-10-CM | POA: Diagnosis not present

## 2023-02-15 DIAGNOSIS — M6281 Muscle weakness (generalized): Secondary | ICD-10-CM | POA: Diagnosis not present

## 2023-02-17 DIAGNOSIS — E1165 Type 2 diabetes mellitus with hyperglycemia: Secondary | ICD-10-CM | POA: Diagnosis not present

## 2023-02-21 DIAGNOSIS — R2681 Unsteadiness on feet: Secondary | ICD-10-CM | POA: Diagnosis not present

## 2023-02-21 DIAGNOSIS — Z9181 History of falling: Secondary | ICD-10-CM | POA: Diagnosis not present

## 2023-02-21 DIAGNOSIS — R2689 Other abnormalities of gait and mobility: Secondary | ICD-10-CM | POA: Diagnosis not present

## 2023-02-21 DIAGNOSIS — S82844A Nondisplaced bimalleolar fracture of right lower leg, initial encounter for closed fracture: Secondary | ICD-10-CM | POA: Diagnosis not present

## 2023-02-22 DIAGNOSIS — M6281 Muscle weakness (generalized): Secondary | ICD-10-CM | POA: Diagnosis not present

## 2023-02-23 ENCOUNTER — Telehealth: Payer: Self-pay | Admitting: Cardiovascular Disease

## 2023-02-23 DIAGNOSIS — Z9181 History of falling: Secondary | ICD-10-CM | POA: Diagnosis not present

## 2023-02-23 DIAGNOSIS — L84 Corns and callosities: Secondary | ICD-10-CM | POA: Diagnosis not present

## 2023-02-23 DIAGNOSIS — M86172 Other acute osteomyelitis, left ankle and foot: Secondary | ICD-10-CM | POA: Diagnosis not present

## 2023-02-23 DIAGNOSIS — I890 Lymphedema, not elsewhere classified: Secondary | ICD-10-CM | POA: Diagnosis not present

## 2023-02-23 DIAGNOSIS — L97511 Non-pressure chronic ulcer of other part of right foot limited to breakdown of skin: Secondary | ICD-10-CM | POA: Diagnosis not present

## 2023-02-23 DIAGNOSIS — M7989 Other specified soft tissue disorders: Secondary | ICD-10-CM | POA: Diagnosis not present

## 2023-02-23 DIAGNOSIS — B351 Tinea unguium: Secondary | ICD-10-CM | POA: Diagnosis not present

## 2023-02-23 DIAGNOSIS — R2681 Unsteadiness on feet: Secondary | ICD-10-CM | POA: Diagnosis not present

## 2023-02-23 DIAGNOSIS — L603 Nail dystrophy: Secondary | ICD-10-CM | POA: Diagnosis not present

## 2023-02-23 DIAGNOSIS — E1142 Type 2 diabetes mellitus with diabetic polyneuropathy: Secondary | ICD-10-CM | POA: Diagnosis not present

## 2023-02-23 DIAGNOSIS — L97524 Non-pressure chronic ulcer of other part of left foot with necrosis of bone: Secondary | ICD-10-CM | POA: Diagnosis not present

## 2023-02-23 DIAGNOSIS — R2689 Other abnormalities of gait and mobility: Secondary | ICD-10-CM | POA: Diagnosis not present

## 2023-02-23 NOTE — Telephone Encounter (Signed)
   Name: [name], MD  DOB: [birth year]  MRN: [id number]  Primary Cardiologist: Lorine Bears, MD  Chart reviewed as part of pre-operative protocol coverage. Because of [name], MD's past medical history and time since last visit, he will require a follow-up telephone visit in order to better assess preoperative cardiovascular risk.  Pre-op covering staff: - Please schedule appointment and call patient to inform them. If patient already had an upcoming appointment within acceptable timeframe, please add "pre-op clearance" to the appointment notes so provider is aware. - Please contact requesting surgeon's office via preferred method (i.e, phone, fax) to inform them of need for appointment prior to surgery.  He is on Plavix for history of stroke.  This will need to come from either neurology or PCP.  Sharlene Dory, PA-C  02/23/2023, 4:16 PM

## 2023-02-23 NOTE — Telephone Encounter (Signed)
   Pre-operative Risk Assessment    Patient Name: James BULLINGER, MD  DOB: 04-03-1931 MRN: [id number]      Request for Surgical Clearance    Procedure:  Left Second Toe Partial Amputaion  Date of Surgery:  Clearance 03/02/23                                 Surgeon:  not listed Surgeon's Group or Practice Name:  [facility] Phone number:  [phone] Fax number:  [phone]   Type of Clearance Requested:  Anticoagulant, modify as needed    Type of Anesthesia:  Not Indicated   Additional requests/questions:    Signed, Narda Amber   02/23/2023, 2:48 PM

## 2023-02-23 NOTE — Telephone Encounter (Signed)
Left message to call back to set up tele pre op appt.  

## 2023-02-26 ENCOUNTER — Other Ambulatory Visit: Payer: Self-pay | Admitting: Podiatry

## 2023-02-26 DIAGNOSIS — M6281 Muscle weakness (generalized): Secondary | ICD-10-CM | POA: Diagnosis not present

## 2023-02-27 ENCOUNTER — Encounter: Payer: Self-pay | Admitting: Podiatry

## 2023-02-27 ENCOUNTER — Telehealth: Payer: Self-pay | Admitting: Family Medicine

## 2023-02-27 ENCOUNTER — Other Ambulatory Visit: Payer: Self-pay

## 2023-02-27 ENCOUNTER — Encounter
Admission: RE | Admit: 2023-02-27 | Discharge: 2023-02-27 | Disposition: A | Discharge status: Home / Self Care | Payer: TRICARE For Life (TFL) | Source: Ambulatory Visit | Attending: Podiatry | Admitting: Podiatry

## 2023-02-27 ENCOUNTER — Encounter: Payer: Self-pay | Admitting: Urgent Care

## 2023-02-27 ENCOUNTER — Encounter
Admission: RE | Admit: 2023-02-27 | Discharge: 2023-02-27 | Disposition: A | Discharge status: Home / Self Care | Payer: Medicare PPO | Source: Ambulatory Visit | Attending: Podiatry | Admitting: Podiatry

## 2023-02-27 VITALS — Ht 68.0 in | Wt 190.0 lb

## 2023-02-27 DIAGNOSIS — Z794 Long term (current) use of insulin: Secondary | ICD-10-CM | POA: Insufficient documentation

## 2023-02-27 DIAGNOSIS — E1169 Type 2 diabetes mellitus with other specified complication: Secondary | ICD-10-CM

## 2023-02-27 DIAGNOSIS — Z01818 Encounter for other preprocedural examination: Secondary | ICD-10-CM | POA: Insufficient documentation

## 2023-02-27 DIAGNOSIS — I129 Hypertensive chronic kidney disease with stage 1 through stage 4 chronic kidney disease, or unspecified chronic kidney disease: Secondary | ICD-10-CM | POA: Diagnosis not present

## 2023-02-27 DIAGNOSIS — E11621 Type 2 diabetes mellitus with foot ulcer: Secondary | ICD-10-CM | POA: Diagnosis not present

## 2023-02-27 DIAGNOSIS — I69351 Hemiplegia and hemiparesis following cerebral infarction affecting right dominant side: Secondary | ICD-10-CM

## 2023-02-27 DIAGNOSIS — E1165 Type 2 diabetes mellitus with hyperglycemia: Secondary | ICD-10-CM | POA: Diagnosis not present

## 2023-02-27 DIAGNOSIS — I5189 Other ill-defined heart diseases: Secondary | ICD-10-CM

## 2023-02-27 DIAGNOSIS — G4733 Obstructive sleep apnea (adult) (pediatric): Secondary | ICD-10-CM

## 2023-02-27 DIAGNOSIS — N1831 Chronic kidney disease, stage 3a: Secondary | ICD-10-CM

## 2023-02-27 DIAGNOSIS — Z01812 Encounter for preprocedural laboratory examination: Secondary | ICD-10-CM

## 2023-02-27 DIAGNOSIS — E785 Hyperlipidemia, unspecified: Secondary | ICD-10-CM

## 2023-02-27 DIAGNOSIS — D696 Thrombocytopenia, unspecified: Secondary | ICD-10-CM

## 2023-02-27 DIAGNOSIS — R001 Bradycardia, unspecified: Secondary | ICD-10-CM

## 2023-02-27 DIAGNOSIS — I10 Essential (primary) hypertension: Secondary | ICD-10-CM

## 2023-02-27 DIAGNOSIS — L97521 Non-pressure chronic ulcer of other part of left foot limited to breakdown of skin: Secondary | ICD-10-CM | POA: Diagnosis not present

## 2023-02-27 DIAGNOSIS — R9431 Abnormal electrocardiogram [ECG] [EKG]: Secondary | ICD-10-CM | POA: Diagnosis not present

## 2023-02-27 LAB — BASIC METABOLIC PANEL
Anion gap: 10 (ref 5–15)
BUN: 27 mg/dL — ABNORMAL HIGH (ref 8–23)
CO2: 21 mmol/L — ABNORMAL LOW (ref 22–32)
Calcium: 8.2 mg/dL — ABNORMAL LOW (ref 8.9–10.3)
Chloride: 102 mmol/L (ref 98–111)
Creatinine, Ser: 1.48 mg/dL — ABNORMAL HIGH (ref 0.61–1.24)
GFR, Estimated: 45 mL/min — ABNORMAL LOW (ref >60)
Glucose, Bld: 205 mg/dL — ABNORMAL HIGH (ref 70–99)
Potassium: 3.6 mmol/L (ref 3.5–5.1)
Sodium: 133 mmol/L — ABNORMAL LOW (ref 135–145)

## 2023-02-27 LAB — CBC
HCT: 38.1 % — ABNORMAL LOW (ref 39.0–52.0)
Hemoglobin: 12.4 g/dL — ABNORMAL LOW (ref 13.0–17.0)
MCH: 29.3 pg (ref 26.0–34.0)
MCHC: 32.5 g/dL (ref 30.0–36.0)
MCV: 90.1 fL (ref 80.0–100.0)
Platelets: 152 10*3/uL (ref 150–400)
RBC: 4.23 MIL/uL (ref 4.22–5.81)
RDW: 13.3 % (ref 11.5–15.5)
WBC: 7.4 10*3/uL (ref 4.0–10.5)
nRBC: 0 % (ref 0.0–0.2)

## 2023-02-27 NOTE — Patient Instructions (Addendum)
Your procedure is scheduled on: Friday 03/02/23 To find out your arrival time, please call [phone] between 1PM - 3PM on:   Thursday 03/01/23 Report to the Registration Desk on the 1st floor of the [facility]. Free Valet parking is available.  If your arrival time is 6:00 am, do not arrive before that time as the [facility] entrance doors do not open until 6:00 am.  REMEMBER: Instructions that are not followed completely may result in serious medical risk, up to and including death; or upon the discretion of your surgeon and anesthesiologist your surgery may need to be rescheduled.  Do not eat food after midnight the night before surgery.  No gum chewing or hard candies.  You may however, drink CLEAR liquids up to 2 hours before you are scheduled to arrive for your surgery. Do not drink anything within 2 hours of your scheduled arrival time.  Type 1 and Type 2 diabetics should only drink water.  In addition, your doctor has ordered for you to drink the provided:  Gatorade G2  One week prior to surgery: Stop Anti-inflammatories (NSAIDS) such as Advil, Aleve, Ibuprofen, Motrin, Naproxen, Naprosyn and Aspirin based products such as Excedrin, Goody's Powder, BC Powder. You may however, continue to take Tylenol if needed for pain up until the day of surgery.  Stop ANY OVER THE COUNTER supplements until after surgery.  Continue taking all prescribed medications with the exception of the following: Decrease your bedtime insulin to half Evaristo Bury 45 units). No insulin the morning of your procedure. Do not take Ozempic again until after the Saturday after  your procedure.  Follow recommendations from Cardiologist or PCP regarding stopping blood thinners. Awaiting instructions regarding your ability to hold your Plavix.  TAKE ONLY THESE MEDICATIONS THE MORNING OF SURGERY WITH A SIP OF WATER:  gabapentin (NEURONTIN)  levothyroxine (SYNTHROID) RABEprazole (ACIPHEX)  tamsulosin (FLOMAX)   You may use your eye drops  No Alcohol for 24 hours before or after surgery.  No Smoking including e-cigarettes for 24 hours before surgery.  No chewable tobacco products for at least 6 hours before surgery.  No nicotine patches on the day of surgery.  Do not use any "recreational" drugs for at least a week (preferably 2 weeks) before your surgery.  Please be advised that the combination of cocaine and anesthesia may have negative outcomes, up to and including death. If you test positive for cocaine, your surgery will be cancelled.  On the morning of surgery brush your teeth with toothpaste and water, you may rinse your mouth with mouthwash if you wish. Do not swallow any toothpaste or mouthwash.  Use CHG Soap or wipes as directed on instruction sheet.  Do not wear lotions, powders, or perfumes.   Do not shave body hair from the neck down 48 hours before surgery.  Wear comfortable clothing (specific to your surgery type) to the hospital.  Do not wear jewelry, make-up, hairpins, clips or nail polish.  Contact lenses, hearing aids and dentures may not be worn into surgery.  Bring your C-PAP to the hospital in case you may have to spend the night.   Do not bring valuables to the hospital. [facility] is not responsible for any missing/lost belongings or valuables.   Notify your doctor if there is any change in your medical condition (cold, fever, infection).  If you are being discharged the day of surgery, you will not be allowed to drive home. You will need a responsible individual to drive  you home and stay with you for 24 hours after surgery.   If you are taking public transportation, you will need to have a responsible individual with you.  If you are being admitted to the hospital overnight, leave your suitcase in the car. After surgery it may be brought to your room.  In case of increased patient census, it may be necessary for you, the patient, to continue your  postoperative care in the Same Day Surgery department.  After surgery, you can help prevent lung complications by doing breathing exercises.  Take deep breaths and cough every 1-2 hours. Your doctor may order a device called an Incentive Spirometer to help you take deep breaths. When coughing or sneezing, hold a pillow firmly against your incision with both hands. This is called "splinting." Doing this helps protect your incision. It also decreases belly discomfort.  Surgery Visitation Policy:  Patients undergoing a surgery or procedure may have two family members or support persons with them as long as the person is not COVID-19 positive or experiencing its symptoms.   Inpatient Visitation:    Visiting hours are 7 a.m. to 8 p.m. Up to four visitors are allowed at one time in a patient room. The visitors may rotate out with other people during the day. One designated support person (adult) may remain overnight.  Please call the Pre-admissions Testing Dept. at [phone] if you have any questions about these instructions.     Preparing for Surgery with CHLORHEXIDINE GLUCONATE (CHG) Soap  Chlorhexidine Gluconate (CHG) Soap  o An antiseptic cleaner that kills germs and bonds with the skin to continue killing germs even after washing  o Used for showering the night before surgery and morning of surgery  Before surgery, you can play an important role by reducing the number of germs on your skin.  CHG (Chlorhexidine gluconate) soap is an antiseptic cleanser which kills germs and bonds with the skin to continue killing germs even after washing.  Please do not use if you have an allergy to CHG or antibacterial soaps. If your skin becomes reddened/irritated stop using the CHG.  1. Shower the NIGHT BEFORE SURGERY and the MORNING OF SURGERY with CHG soap.  2. If you choose to wash your hair, wash your hair first as usual with your normal shampoo.  3. After shampooing, rinse your hair  and body thoroughly to remove the shampoo.  4. Use CHG as you would any other liquid soap. You can apply CHG directly to the skin and wash gently with a scrungie or a clean washcloth.  5. Apply the CHG soap to your body only from the neck down. Do not use on open wounds or open sores. Avoid contact with your eyes, ears, mouth, and genitals (private parts). Wash face and genitals (private parts) with your normal soap.  6. Wash thoroughly, paying special attention to the area where your surgery will be performed.  7. Thoroughly rinse your body with warm water.  8. Do not shower/wash with your normal soap after using and rinsing off the CHG soap.  9. Pat yourself dry with a clean towel.  10. Wear clean pajamas to bed the night before surgery.  12. Place clean sheets on your bed the night of your first shower and do not sleep with pets.  13. Shower again with the CHG soap on the day of surgery prior to arriving at the hospital.  14. Do not apply any deodorants/lotions/powders.  15. Please wear clean clothes to  the hospital.

## 2023-02-27 NOTE — Pre-Procedure Instructions (Signed)
Per Dr Excell Seltzer:  Patient in office for labs. He was given written instructions and told to continue his Plavix except for Friday 03/02/23 the day of his procedure and if there are any changes we will contact him or someone from Dr Bernette Redbird office will contact him.

## 2023-02-27 NOTE — Telephone Encounter (Signed)
Patient is having a toe amputation on Friday 03/02/2023 ,[facility] would like to know if he should discontinue  clopidogrel (PLAVIX) 75 MG tablet ?

## 2023-02-28 ENCOUNTER — Telehealth: Payer: Self-pay

## 2023-02-28 DIAGNOSIS — M6281 Muscle weakness (generalized): Secondary | ICD-10-CM | POA: Diagnosis not present

## 2023-02-28 LAB — HEMOGLOBIN A1C
Hgb A1c MFr Bld: 6.7 % — ABNORMAL HIGH (ref 4.8–5.6)
Mean Plasma Glucose: 145.59 mg/dL

## 2023-02-28 NOTE — Telephone Encounter (Signed)
Received faxed Clearance Form from Lajuana Carry of [facility]. Pt is scheduled for L 2nd toe partial amputation on 03/02/23. Needs instructions for blood thinner prior to surgery.   Placed form in Dr. Timoteo Expose box.

## 2023-02-28 NOTE — Telephone Encounter (Signed)
Received faxed Clinical Notice from [facility] concerning holding pt's semaglutide prior to scheduled procedure.   Placed form in Dr. Timoteo Expose box.

## 2023-02-28 NOTE — Telephone Encounter (Addendum)
Placed this form in [name]'s box.  Ok to hold semaglutide 1 wk prior to surgery. Plz notify patient. See other note for preop request.

## 2023-02-28 NOTE — Telephone Encounter (Addendum)
I just received this request today for L second toe partial amputation for osteomyelitis.  I last saw patient 10/2022. He last saw cardiology 07/2022.  He has surgery planned for 03/02/2023.  I am out of office tomorrow so can't see him tomorrow. Reviewing chart, he needs telephone visit for cardiology clearance and hasn't scheduled this yet.   Please call pt - is he having any fever, pain, hypotension, malaise, nausea or other symptoms?   If not, I recommend postponing surgery until he is able to schedule cardiology telephone visit for cardiac clearance, and recommend office visit with me for preop medical clearance ASAP - I could see him Monday at 1pm. He may need to hold plavix prior to surgery.   Please notify requesting office of this recommendation. Form placed in [name]'s box.

## 2023-03-01 ENCOUNTER — Telehealth: Payer: Self-pay | Admitting: *Deleted

## 2023-03-01 ENCOUNTER — Ambulatory Visit: Payer: TRICARE For Life (TFL) | Attending: Nurse Practitioner | Admitting: Nurse Practitioner

## 2023-03-01 ENCOUNTER — Encounter: Payer: Self-pay | Admitting: Nurse Practitioner

## 2023-03-01 DIAGNOSIS — Z01810 Encounter for preprocedural cardiovascular examination: Secondary | ICD-10-CM | POA: Diagnosis not present

## 2023-03-01 DIAGNOSIS — M6281 Muscle weakness (generalized): Secondary | ICD-10-CM | POA: Diagnosis not present

## 2023-03-01 DIAGNOSIS — G4733 Obstructive sleep apnea (adult) (pediatric): Secondary | ICD-10-CM | POA: Diagnosis not present

## 2023-03-01 NOTE — Telephone Encounter (Signed)
Pt called back today to schedule a tele appt as add on due to procedure is 03/02/23. Med rec and consent are done.

## 2023-03-01 NOTE — Telephone Encounter (Signed)
Faxed form.  Spoke with pt relaying Dr. Timoteo Expose message. Pt verbalizes understanding.

## 2023-03-01 NOTE — Telephone Encounter (Signed)
Pt called back today to schedule a tele appt as add on due to procedure is 03/02/23. Med rec and consent are done.      Patient Consent for Virtual Visit        [name], MD has provided verbal consent on 03/01/2023 for a virtual visit (video or telephone).   CONSENT FOR VIRTUAL VISIT FOR:  [name], MD  By participating in this virtual visit I agree to the following:  I hereby voluntarily request, consent and authorize [facility] and its employed or contracted physicians, physician assistants, nurse practitioners or other licensed health care professionals (the Practitioner), to provide me with telemedicine health care services (the "Services") as deemed necessary by the treating Practitioner. I acknowledge and consent to receive the Services by the Practitioner via telemedicine. I understand that the telemedicine visit will involve communicating with the Practitioner through live audiovisual communication technology and the disclosure of certain medical information by electronic transmission. I acknowledge that I have been given the opportunity to request an in-person assessment or other available alternative prior to the telemedicine visit and am voluntarily participating in the telemedicine visit.  I understand that I have the right to withhold or withdraw my consent to the use of telemedicine in the course of my care at any time, without affecting my right to future care or treatment, and that the Practitioner or I may terminate the telemedicine visit at any time. I understand that I have the right to inspect all information obtained and/or recorded in the course of the telemedicine visit and may receive copies of available information for a reasonable fee.  I understand that some of the potential risks of receiving the Services via telemedicine include:  Delay or interruption in medical evaluation due to technological equipment failure or disruption; Information  transmitted may not be sufficient (e.g. poor resolution of images) to allow for appropriate medical decision making by the Practitioner; and/or  In rare instances, security protocols could fail, causing a breach of personal health information.  Furthermore, I acknowledge that it is my responsibility to provide information about my medical history, conditions and care that is complete and accurate to the best of my ability. I acknowledge that Practitioner's advice, recommendations, and/or decision may be based on factors not within their control, such as incomplete or inaccurate data provided by me or distortions of diagnostic images or specimens that may result from electronic transmissions. I understand that the practice of medicine is not an exact science and that Practitioner makes no warranties or guarantees regarding treatment outcomes. I acknowledge that a copy of this consent can be made available to me via my patient portal [facility] MyChart), or I can request a printed copy by calling the office of [facility].    I understand that my insurance will be billed for this visit.   I have read or had this consent read to me. I understand the contents of this consent, which adequately explains the benefits and risks of the Services being provided via telemedicine.  I have been provided ample opportunity to ask questions regarding this consent and the Services and have had my questions answered to my satisfaction. I give my informed consent for the services to be provided through the use of telemedicine in my medical care

## 2023-03-01 NOTE — Progress Notes (Signed)
Virtual Visit via Telephone Note   Because of [name], James Bell's co-morbid illnesses, he is at least at moderate risk for complications without adequate follow up.  This format is felt to be most appropriate for this patient at this time.  The patient did not have access to video technology/had technical difficulties with video requiring transitioning to audio format only (telephone).  All issues noted in this document were discussed and addressed.  No physical exam could be performed with this format.  Please refer to the patient's chart for his consent to telehealth for [facility].  Evaluation Performed:  Preoperative cardiovascular risk assessment _____________   Date:  03/01/2023   Patient ID:  James Bell, James Bell, DOB Feb 01, 1931, MRN [id number] Patient Location:  Home Provider location:   Office  Primary Care Provider:  Eustaquio Bell, James Bell Primary Cardiologist:  James Bell, James Bell  Chief Complaint / Patient Profile   89 y.o. y/o male with a h/o type 2 diabetes, bradycardia, 2nd degree AV block Mobitz type II, CKD, hypothyroidism, CVA who is pending left second toe partial amputation and presents today for telephonic preoperative cardiovascular risk assessment.  History of Present Illness    James Bell, James Bell is a 89 y.o. male who presents via audio/video conferencing for a telehealth visit today.  Pt was last seen in cardiology clinic on 07/20/22 by James Bell.  At that time James Bell, James Bell was doing well.  The patient is now pending procedure as outlined above. Since his last visit, he  denies chest pain, shortness of breath, lower extremity edema, fatigue, palpitations, melena, hematuria, hemoptysis, diaphoresis, weakness, presyncope, syncope, orthopnea, and PND. He is currently physically limited by leg fracture and foot injury but prior to injury he was walking for exercise and remaining active with house work. He can achieve > 4 METS activity  without concerning cardiac symptoms.   Past Medical History    Past Medical History:  Diagnosis Date   Anemia    Basal cell carcinoma 2018   R temple    Basal cell carcinoma of nose 2002   bridge of nose   BPH with obstruction/lower urinary tract symptoms    failed flomax, uroxatral, myrbetriq - sees urology James Bell) pending videourodynamics   CKD (chronic kidney disease) stage 3, GFR 30-59 ml/min (HCC) 03/11/2012   Renal US 02/2014 - multiple kidney cysts    CVA (cerebral infarction) 03/07/2012   slurred speech; right hemplegia, left handed - completed PT 07/2014 (17 sessions)   Dehydration    Depression    mild, on cymbalta per prior PCP   Erectile dysfunction    per urology (prostaglandin injection)   GERD (gastroesophageal reflux disease)    with sliding HH   H/O hiatal hernia    History of kidney problems 12/2010   lacerated left kidney after fall   HLD (hyperlipidemia)    Hx of basal cell carcinoma 07/10/2017   R. lat. forehead near hair line   Kidney cysts 02/2014   bilateral (renal US)   Migraines 03/11/2012   now rare   OSA on CPAP    sleep study 01/2014 - severe, CPAP at 10, previously on nuvigil (Dr. Gareth Bell)   Palpitations 01/2012   holter WNL, rare PAC/PVCs   Presbycusis of both ears 2016   rec amplification [facility])   Restless leg syndrome    well controlled on mirapex   Rosacea    James Bell   TIA (transient ischemic attack)  Type 2 diabetes, controlled, with neuropathy (HCC) 2011   Dr James Bell in [facility]   Past Surgical History:  Procedure Laterality Date   ABI  09/18/2005   WNL   CARDIAC CATHETERIZATION  09/18/2002   normal; [place]   CARDIOVASCULAR STRESS TEST  02/16/2014   WNL, low risk scan, EF 68%   CAROTID ENDARTERECTOMY     pt denies 02/27/23   CATARACT EXTRACTION W/ INTRAOCULAR LENS IMPLANT  11//2012 - 08/2011   COLONOSCOPY  03/19/2011   poor prep, unable to biopsy polyp in tic fold, rpt 3 mo James Bell)   COLONOSCOPY  06/19/2011    mult polyps adenomatous, severe diverticulosis, rpt 3 yrs James Bell)   CYSTOSCOPY WITH URETHRAL DILATATION N/A 07/24/2013   Procedure: CYSTOSCOPY WITH URETHRAL DILATATION;  Surgeon: James Bell, James Bell;  Location: [facility]S;  Service: Urology;  Laterality: N/A;   ESOPHAGOGASTRODUODENOSCOPY  09/18/2004   duodenitis, medual HH, Grade 2 reflux, mild gastritis   ESOPHAGOGASTRODUODENOSCOPY  07/20/2011   mild chronic gastritis   HEMORRHOID SURGERY  09/18/1989   INGUINAL HERNIA REPAIR  1981; 2002   left, then repaired   MRI brain  04/18/2010   WNL   PROSTATE SURGERY  02/16/2009   PVP (laser)   SKIN CANCER EXCISION  09/18/2000   bridge of nose, BCC   TONSILLECTOMY AND ADENOIDECTOMY  ~ 1945   TRANSURETHRAL INCISION OF PROSTATE N/A 07/24/2013   Procedure: TRANSURETHRAL INCISION OF THE PROSTATE (TUIP);  Surgeon: James Bell, James Bell;  Location: [facility];  Service: Urology;  Laterality: N/A;   URETHRAL DILATION  09/18/2012   James Bell    Allergies  Allergies  Allergen Reactions   Dulaglutide     Other reaction(s): upset stomach Other reaction(s): Abdominal Pain, upset stomach Other reaction(s): upset stomach    Empagliflozin Other (See Comments)    Other reaction(s): yeast infection Other reaction(s): Other (See Comments) Other reaction(s): yeast infection    Lisinopril Cough    tachycardia   Nexium [Esomeprazole] Other (See Comments)    Headache   Oxycodone Other (See Comments)    Even 1/2 tablet of 5mg  caused increased confusion - contributory to hospitalization 10/2020 with AMS   Pregabalin Nausea Only and Other (See Comments)    Other reaction(s): MALAISE, SLIGHT NAUSEA, SLIGHT HEADACHE Other reaction(s): MALAISE, SLIGHT NAUSEA, SLIGHT HEADACHE    Rosuvastatin Other (See Comments)    Other reaction(s): muscle pain 2.16.2022 Pt is current on this medication without any issues. Other reaction(s): muscle pain 2.16.2022 Pt is current on this medication without any  issues. Other reaction(s): muscle pain    Home Medications    Prior to Admission medications   Medication Sig Start Date End Date Taking? Authorizing Provider  Alpha-Lipoic Acid 600 MG CAPS Take 1 capsule (600 mg total) by mouth daily. 06/28/21   James Bell, James Bell  brimonidine-timolol (COMBIGAN) 0.2-0.5 % ophthalmic solution Place 1 drop into both eyes every 12 (twelve) hours.    Provider, Historical, James Bell  chlorpheniramine-HYDROcodone (TUSSIONEX) 10-8 MG/5ML Take 5 mLs by mouth at bedtime as needed for cough. 10/04/22   Karie Schwalbe, James Bell  clopidogrel (PLAVIX) 75 MG tablet TAKE 1 TABLET DAILY 09/06/22   James Bell, James Bell  Coenzyme Q10 (COQ10) 150 MG CAPS Take 1 capsule by mouth daily.    Provider, Historical, James Bell  DULoxetine (CYMBALTA) 60 MG capsule TAKE 1 CAPSULE DAILY Patient taking differently: Take 60 mg by mouth at bedtime. 08/29/22   James Bell, James Bell  famotidine (PEPCID) 20 MG tablet Take 1 tablet (  20 mg total) by mouth at bedtime. 04/05/21   Kathlen Mody, James Bell  fluconazole (DIFLUCAN) 150 MG tablet Take 1 tablet (150 mg total) by mouth once a week. Patient taking differently: Take 150 mg by mouth once a week. Saturday 01/20/22   James Bell, James Bell  fluticasone (FLOVENT HFA) 110 MCG/ACT inhaler Inhale 1 puff into the lungs in the morning and at bedtime. Patient not taking: Reported on 02/27/2023 06/03/22   James Bell, James Bell  gabapentin (NEURONTIN) 300 MG capsule Take 600 mg by mouth 4 (four) times daily. 600 mg BID is his minimum dose without having breakthrough symptoms. He is aware 900 mg/day is recommended per kidney function. 08/08/22   James Bell, James Bell  insulin aspart (NOVOLOG) 100 UNIT/ML injection CBG 70 - 120: 0 units  CBG 121 - 150: 1 unit  CBG 151 - 200: 2 units  CBG 201 - 250: 3 units  CBG 251 - 300: 5 units  CBG 301 - 350: 7 units  CBG 351 - 400: 9 units 04/05/21   Kathlen Mody, James Bell  insulin degludec (TRESIBA FLEXTOUCH) 200 UNIT/ML FlexTouch Pen Inject  90 Units into the skin at bedtime.    Provider, Historical, James Bell  Insulin Pen Needle (ULTICARE SHORT PEN NEEDLES) 31G X 8 MM MISC USE TO INJECT MEDICATION DAILY 12/18/22   James Bell, James Bell  levothyroxine (SYNTHROID) 50 MCG tablet Take 1 tablet (50 mcg total) by mouth daily before breakfast. Per endo Dr James Bell 12/07/19   James Bell, James Bell  mupirocin ointment (BACTROBAN) 2 % Place 1 application. into the nose 2 (two) times daily. 01/03/22   Delfino Lovett, James Bell  nitroGLYCERIN (NITROSTAT) 0.4 MG SL tablet DISSOLVE 1 TABLET UNDER THE TONGUE EVERY 5 MINUTES AS NEEDED FOR CHEST PAIN Patient not taking: Reported on 03/01/2023 07/28/16   James Bell, James Bell  ondansetron (ZOFRAN) 4 MG tablet Take 4 mg by mouth daily as needed for nausea or vomiting.    Provider, Historical, James Bell  polyethylene glycol (MIRALAX / GLYCOLAX) 17 g packet Take 17 g by mouth daily as needed. 04/05/21   Kathlen Mody, James Bell  pramipexole (MIRAPEX) 1 MG tablet TAKE 1 TABLET DAILY AT 4 P.M. 09/13/22   James Bell, James Bell  RABEprazole (ACIPHEX) 20 MG tablet Take 1 tablet (20 mg total) by mouth daily. 09/08/22   James Bell, James Bell  rosuvastatin (CRESTOR) 40 MG tablet TAKE 1 TABLET DAILY Patient taking differently: Take 40 mg by mouth at bedtime. 09/13/22   James Bell, James Bell  Semaglutide, 1 MG/DOSE, (OZEMPIC, 1 MG/DOSE,) 4 MG/3ML SOPN Inject 1 mg into the skin once a week. Saturday 01/06/22   James Bell, James Bell  sennosides-docusate sodium (SENOKOT-S) 8.6-50 MG tablet Take 1 tablet by mouth daily. Patient not taking: Reported on 02/27/2023    Provider, Historical, James Bell  sodium chloride (MURO 128) 5 % ophthalmic solution 1 drop as needed for eye irritation.    Provider, Historical, James Bell  tamsulosin (FLOMAX) 0.4 MG CAPS capsule TAKE 1 CAPSULE DAILY AFTER BREAKFAST 07/19/22   James Bell, James Bell  vitamin C (ASCORBIC ACID) 500 MG tablet Take 1,000 mg by mouth daily as needed. Patient not taking: Reported on 02/27/2023    Provider, Historical, James Bell     Physical Exam    Vital Signs:  James Bell, James Bell does not have vital signs available for review today.  Given telephonic nature of communication, physical exam is limited. AAOx3. NAD. Normal affect.  Speech and respirations are unlabored.  Accessory Clinical Findings    None  Assessment &  Plan    1.  Preoperative Cardiovascular Risk Assessment: According to the Revised Cardiac Risk Index (RCRI), his Perioperative Risk of Major Cardiac Event is (%): 6.6. His Functional Capacity in METs is: 5.07 according to the Duke Activity Status Index (DASI). The patient is doing well from a cardiac perspective. Therefore, based on ACC/AHA guidelines, the patient would be at acceptable risk for the planned procedure without further cardiovascular testing.   The patient was advised that if he develops new symptoms prior to surgery to contact our office to arrange for a follow-up visit, and he verbalized understanding.  He is on Plavix for history of stroke. This will need to come from either neurology or PCP.   A copy of this note will be routed to requesting surgeon.  Time:   Today, I have spent 10 minutes with the patient with telehealth technology discussing medical history, symptoms, and management plan.    Levi Aland, NP-C  03/01/2023, 11:03 AM [address] Office [phone] Fax [phone]

## 2023-03-01 NOTE — Progress Notes (Signed)
  Perioperative Services Pre-Admission/Anesthesia Testing    Date: 03/01/23  Name: James DEAKIN, MD MRN:   [id number]  Re: GLP-1 clearance and provider recommendations   Planned Surgical Procedure(s):    Case: [id number] Date/Time: 03/02/23 1200   Procedure: 28825 - TOE INTERPHALANGEAL JOINT - SECOND (Left: Toe)   Anesthesia type: Choice   Pre-op diagnosis:      Acute Osteomyelitis of left foot      Ulcer of left foot with necrosis of bonne      Cellulitis of toe     Type 2 diabetes mellitus with polyneuropathy   Location: [facility] OR ROOM 02 / [facility]   Surgeons: Rosetta Posner, DPM      Clinical Notes:  Patient is scheduled for the above procedure with the indicated provider/surgeon. In review of his medication reconciliation it was noted that patient is on a prescribed GLP-1 medication. Per guidelines issued by the American Society of Anesthesiologists (ASA), it is recommended that these medications be held for 7 days prior to the patient undergoing any type of elective surgical procedure. The patient is taking the following GLP-1 medication:  [x]  SEMAGLUTIDE   []  EXENATIDE  []  LIRAGLUTIDE   []  LIXISENATIDE  []  DULAGLUTIDE     []  TIRZEPATIDE (GLP-1/GIP)  Reached out to prescribing provider Sharen Hones, MD) to make them aware of the guidelines from anesthesia. Given that this patient takes the prescribed GLP-1 medication for his  diabetes diagnosis, rather than for weight loss, recommendations from the prescribing provider were solicited. Prescribing provider made aware of the following so that informed decision/POC can be developed for this patient that may be taking medications belonging to these drug classes:  Oral GLP-1 medications will be held 1 day prior to surgery.  Injectable GLP-1 medications will be held 7 days prior to surgery.  Metformin is routinely held 48 hours prior to surgery due to renal concerns, potential need for contrasted imaging  perioperatively, and the potential for tissue hypoxia leading to drug induced lactic acidosis.  All SGLT2i medications are held 72 hours prior to surgery as they can be associated with the increased potential for developing euglycemic diabetic ketoacidosis (EDKA).   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 Efforts are bring made to mitigate the risk of perioperative hyperglycemic events, as elevated blood glucose levels have been found to contribute to intra/postoperative complications. Additionally, hyperglycemic extremes can potentially necessitate the postponing of a patient's elective case in order to better optimize perioperative glycemic control, again with the aforementioned guidelines in place. With this in mind, recommendations have been sought from the prescribing provider, who has cleared patient to proceed with holding the prescribed GLP-1 as per the guidelines from the ASA.   Provider recommending: no further recommendations received from the prescribing provider.  Copy of signed clearance and recommendations placed on patient's chart for inclusion in their medical record and for review by the surgical/anesthetic team on the day of his procedure.   Quentin Mulling, MSN, APRN, FNP-C, CEN [facility]  Peri-operative Services Nurse Practitioner Phone: [phone] 03/01/23 8:06 AM  NOTE: This note has been prepared using Dragon dictation software. Despite my best ability to proofread, there is always the potential that unintentional transcriptional errors may still occur from this process.

## 2023-03-01 NOTE — Telephone Encounter (Signed)
Spoke with pt relaying Dr. Timoteo Expose message. Pt verbalizes understanding but declines cards clearance and pre-op eval with Dr. Reece Agar. Pt insists on having surgery tomorrow stating, "It's just a toe." Denies any fever, pain, hypotension, malaise, nausea or other sxs.   Spoke with Steward Drone of [facility] relaying Dr. Timoteo Expose recommendations and pt's statement. Says she will document info and relay to the nurse.   Clearance form is not in my box.

## 2023-03-02 ENCOUNTER — Other Ambulatory Visit: Payer: Self-pay

## 2023-03-02 ENCOUNTER — Encounter: Payer: Self-pay | Admitting: Podiatry

## 2023-03-02 ENCOUNTER — Encounter
Admission: RE | Disposition: A | Discharge status: Home / Self Care | Payer: Self-pay | Source: Home / Self Care | Attending: Podiatry

## 2023-03-02 ENCOUNTER — Ambulatory Visit
Admission: RE | Admit: 2023-03-02 | Discharge: 2023-03-02 | Disposition: A | Discharge status: Home / Self Care | Payer: Medicare PPO | Attending: Podiatry | Admitting: Podiatry

## 2023-03-02 ENCOUNTER — Ambulatory Visit: Payer: Medicare PPO | Admitting: Urgent Care

## 2023-03-02 DIAGNOSIS — E1169 Type 2 diabetes mellitus with other specified complication: Secondary | ICD-10-CM | POA: Insufficient documentation

## 2023-03-02 DIAGNOSIS — L97524 Non-pressure chronic ulcer of other part of left foot with necrosis of bone: Secondary | ICD-10-CM | POA: Insufficient documentation

## 2023-03-02 DIAGNOSIS — M86172 Other acute osteomyelitis, left ankle and foot: Secondary | ICD-10-CM | POA: Insufficient documentation

## 2023-03-02 DIAGNOSIS — M8610 Other acute osteomyelitis, unspecified site: Secondary | ICD-10-CM | POA: Diagnosis not present

## 2023-03-02 DIAGNOSIS — G4730 Sleep apnea, unspecified: Secondary | ICD-10-CM | POA: Diagnosis not present

## 2023-03-02 DIAGNOSIS — E1122 Type 2 diabetes mellitus with diabetic chronic kidney disease: Secondary | ICD-10-CM | POA: Insufficient documentation

## 2023-03-02 DIAGNOSIS — E1142 Type 2 diabetes mellitus with diabetic polyneuropathy: Secondary | ICD-10-CM | POA: Insufficient documentation

## 2023-03-02 DIAGNOSIS — Z794 Long term (current) use of insulin: Secondary | ICD-10-CM | POA: Insufficient documentation

## 2023-03-02 DIAGNOSIS — E11621 Type 2 diabetes mellitus with foot ulcer: Secondary | ICD-10-CM | POA: Diagnosis not present

## 2023-03-02 DIAGNOSIS — I130 Hypertensive heart and chronic kidney disease with heart failure and stage 1 through stage 4 chronic kidney disease, or unspecified chronic kidney disease: Secondary | ICD-10-CM | POA: Insufficient documentation

## 2023-03-02 DIAGNOSIS — H401133 Primary open-angle glaucoma, bilateral, severe stage: Secondary | ICD-10-CM | POA: Diagnosis not present

## 2023-03-02 DIAGNOSIS — L03039 Cellulitis of unspecified toe: Secondary | ICD-10-CM | POA: Diagnosis not present

## 2023-03-02 DIAGNOSIS — N183 Chronic kidney disease, stage 3 unspecified: Secondary | ICD-10-CM | POA: Insufficient documentation

## 2023-03-02 DIAGNOSIS — I5032 Chronic diastolic (congestive) heart failure: Secondary | ICD-10-CM | POA: Diagnosis not present

## 2023-03-02 HISTORY — PX: AMPUTATION TOE: SHX6595

## 2023-03-02 LAB — GLUCOSE, CAPILLARY
Glucose-Capillary: 68 mg/dL — ABNORMAL LOW (ref 70–99)
Glucose-Capillary: 67 mg/dL — ABNORMAL LOW (ref 70–99)
Glucose-Capillary: 82 mg/dL (ref 70–99)

## 2023-03-02 SURGERY — AMPUTATION, TOE
Anesthesia: Regional | Site: Toe | Laterality: Left

## 2023-03-02 MED ORDER — DEXTROSE 50 % IV SOLN
25.0000 mL | Freq: Once | INTRAVENOUS | Status: AC
  Administered 2023-03-02: 25 mL via INTRAVENOUS

## 2023-03-02 MED ORDER — PROPOFOL 1000 MG/100ML IV EMUL
INTRAVENOUS | Status: AC
  Filled 2023-03-02: qty 100

## 2023-03-02 MED ORDER — BUPIVACAINE HCL (PF) 0.5 % IJ SOLN
INTRAMUSCULAR | Status: AC
  Filled 2023-03-02: qty 30

## 2023-03-02 MED ORDER — ORAL CARE MOUTH RINSE: 15.0000 mL | Freq: Once | OROMUCOSAL | Status: AC

## 2023-03-02 MED ORDER — CHLORHEXIDINE GLUCONATE 0.12 % MT SOLN
15.0000 mL | Freq: Once | OROMUCOSAL | Status: AC
  Administered 2023-03-02: 15 mL via OROMUCOSAL

## 2023-03-02 MED ORDER — SODIUM CHLORIDE 0.9 % IV SOLN: INTRAVENOUS | Status: DC

## 2023-03-02 MED ORDER — DEXTROSE 50 % IV SOLN
INTRAVENOUS | Status: AC
  Filled 2023-03-02: qty 50

## 2023-03-02 MED ORDER — PROPOFOL 10 MG/ML IV BOLUS
INTRAVENOUS | Status: AC
  Filled 2023-03-02: qty 20

## 2023-03-02 MED ORDER — LIDOCAINE HCL (PF) 2 % IJ SOLN
INTRAMUSCULAR | Status: AC
  Filled 2023-03-02: qty 5

## 2023-03-02 MED ORDER — FENTANYL CITRATE (PF) 100 MCG/2ML IJ SOLN
INTRAMUSCULAR | Status: AC
  Filled 2023-03-02: qty 2

## 2023-03-02 MED ORDER — CEFAZOLIN SODIUM-DEXTROSE 2-4 GM/100ML-% IV SOLN
INTRAVENOUS | Status: AC
  Filled 2023-03-02: qty 100

## 2023-03-02 MED ORDER — CEFAZOLIN SODIUM-DEXTROSE 2-4 GM/100ML-% IV SOLN
2.0000 g | INTRAVENOUS | Status: AC
  Administered 2023-03-02: 2 g via INTRAVENOUS

## 2023-03-02 MED ORDER — CHLORHEXIDINE GLUCONATE 0.12 % MT SOLN
OROMUCOSAL | Status: AC
  Filled 2023-03-02: qty 15

## 2023-03-02 MED ORDER — TRAMADOL HCL 50 MG PO TABS: 50.0000 mg | ORAL_TABLET | Freq: Four times a day (QID) | ORAL | 0 refills | Status: AC | PRN

## 2023-03-02 MED ORDER — BUPIVACAINE HCL 0.5 % IJ SOLN
INTRAMUSCULAR | Status: DC | PRN
  Administered 2023-03-02: 10 mL

## 2023-03-02 SURGICAL SUPPLY — 49 items
BLADE MED AGGRESSIVE (BLADE) ×1 IMPLANT
BLADE OSC/SAGITTAL MD 5.5X18 (BLADE) IMPLANT
BLADE SURG 15 STRL LF DISP TIS (BLADE) IMPLANT
BLADE SURG 15 STRL SS (BLADE)
BLADE SURG MINI STRL (BLADE) IMPLANT
BNDG CMPR 5X4 KNIT ELC UNQ LF (GAUZE/BANDAGES/DRESSINGS) ×1
BNDG CMPR 75X21 PLY HI ABS (MISCELLANEOUS)
BNDG ELASTIC 4INX 5YD STR LF (GAUZE/BANDAGES/DRESSINGS) ×1 IMPLANT
BNDG ESMARCH 4 X 12 STRL LF (GAUZE/BANDAGES/DRESSINGS) ×1
BNDG ESMARCH 4X12 STRL LF (GAUZE/BANDAGES/DRESSINGS) ×1 IMPLANT
BNDG GAUZE DERMACEA FLUFF 4 (GAUZE/BANDAGES/DRESSINGS) ×1 IMPLANT
BNDG GZE DERMACEA 4 6PLY (GAUZE/BANDAGES/DRESSINGS) ×1
CNTNR URN SCR LID CUP LEK RST (MISCELLANEOUS) ×1 IMPLANT
CONT SPEC 4OZ STRL OR WHT (MISCELLANEOUS) ×1
CUFF TOURN SGL QUICK 12 (TOURNIQUET CUFF) IMPLANT
CUFF TOURN SGL QUICK 18X4 (TOURNIQUET CUFF) IMPLANT
DRAPE FLUOR MINI C-ARM 54X84 (DRAPES) IMPLANT
DURAPREP 26ML APPLICATOR (WOUND CARE) ×1 IMPLANT
ELECT REM PT RETURN 9FT ADLT (ELECTROSURGICAL) ×1
ELECTRODE REM PT RTRN 9FT ADLT (ELECTROSURGICAL) ×1 IMPLANT
GAUZE SPONGE 4X4 12PLY STRL (GAUZE/BANDAGES/DRESSINGS) ×2 IMPLANT
GAUZE STRETCH 2X75IN STRL (MISCELLANEOUS) IMPLANT
GAUZE XEROFORM 1X8 LF (GAUZE/BANDAGES/DRESSINGS) ×1 IMPLANT
GLOVE BIO SURGEON STRL SZ7 (GLOVE) ×1 IMPLANT
GLOVE INDICATOR 7.0 STRL GRN (GLOVE) ×1 IMPLANT
GOWN STRL REUS W/ TWL LRG LVL3 (GOWN DISPOSABLE) ×2 IMPLANT
GOWN STRL REUS W/TWL LRG LVL3 (GOWN DISPOSABLE) ×2
HANDPIECE VERSAJET DEBRIDEMENT (MISCELLANEOUS) IMPLANT
IV NS IRRIG 3000ML ARTHROMATIC (IV SOLUTION) IMPLANT
KIT TURNOVER KIT A (KITS) ×1 IMPLANT
LABEL OR SOLS (LABEL) ×1 IMPLANT
MANIFOLD NEPTUNE II (INSTRUMENTS) ×1 IMPLANT
NDL FILTER BLUNT 18X1 1/2 (NEEDLE) ×1 IMPLANT
NDL HYPO 25X1 1.5 SAFETY (NEEDLE) ×2 IMPLANT
NEEDLE FILTER BLUNT 18X1 1/2 (NEEDLE) ×1 IMPLANT
NEEDLE HYPO 25X1 1.5 SAFETY (NEEDLE) ×2 IMPLANT
NS IRRIG 500ML POUR BTL (IV SOLUTION) ×1 IMPLANT
PACK EXTREMITY ARMC (MISCELLANEOUS) ×1 IMPLANT
SOL PREP PVP 2OZ (MISCELLANEOUS) ×1
SOLUTION PREP PVP 2OZ (MISCELLANEOUS) ×1 IMPLANT
STOCKINETTE STRL 6IN 960660 (GAUZE/BANDAGES/DRESSINGS) ×1 IMPLANT
SUT ETHILON 3-0 FS-10 30 BLK (SUTURE) ×2
SUT VIC AB 3-0 SH 27 (SUTURE) ×1
SUT VIC AB 3-0 SH 27X BRD (SUTURE) ×1 IMPLANT
SUTURE EHLN 3-0 FS-10 30 BLK (SUTURE) ×2 IMPLANT
SWAB CULTURE AMIES ANAERIB BLU (MISCELLANEOUS) IMPLANT
SYR 10ML LL (SYRINGE) ×1 IMPLANT
TRAP FLUID SMOKE EVACUATOR (MISCELLANEOUS) ×1 IMPLANT
WATER STERILE IRR 500ML POUR (IV SOLUTION) ×1 IMPLANT

## 2023-03-02 NOTE — H&P (Signed)
HISTORY AND PHYSICAL INTERVAL NOTE:  03/02/2023  11:46 AM  James Sarks, MD  has presented today for surgery, with the diagnosis of Acute Osteomyelitis of left foot  Ulcer of left foot with necrosis of bonne  Cellulitis of toe Type 2 diabetes mellitus with polyneuropathy.  The various methods of treatment have been discussed with the patient.  No guarantees were given.  After consideration of risks, benefits and other options for treatment, the patient has consented to surgery.  I have reviewed the patients' chart and labs.    PROCEDURE: LEFT FOOT PARTIAL 2ND TOE AMPUTATION  A history and physical examination was performed in my office.  The patient was reexamined.  There have been no changes to this history and physical examination.  James Bell, DPM

## 2023-03-02 NOTE — Op Note (Signed)
PODIATRY / FOOT AND ANKLE SURGERY OPERATIVE REPORT    SURGEON: Rosetta Posner, DPM  PRE-OPERATIVE DIAGNOSIS:  1.  Left second toe DIPJ ulceration with necrosis of bone 2.  Left second toe osteomyelitis DIPJ 3.  Diabetes type 2 with polyneuropathy  POST-OPERATIVE DIAGNOSIS: Same  PROCEDURE(S): Left partial second toe amputation  HEMOSTASIS: No tourniquet  ANESTHESIA: local  ESTIMATED BLOOD LOSS: 10 cc  FINDING(S): 1.  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  Patient presents today after being treated with local wound care and antibiotic therapy but failure to progress wound.  All treatment options were discussed with the patient both conservative and surgical attempts at correction include potential risks and complications at this time patient is elected for surgical procedure consisting of left partial second toe potation.  Consent obtained prior to procedure, no guarantees given..  DESCRIPTION: After obtaining full informed written consent, the patient was brought back to the operating room and placed supine upon the operating table.  The patient received IV antibiotics prior to induction.  A local block was performed to the left second toe with 10 cc of half percent Marcaine plain, the patient was prepped and draped in the standard fashion.  Attention was then directed to the left second toe at the level of the PIPJ where a fishmouth of incision was made over the area.  The incision was made straight to bone.  At this time the PIPJ was identified and a extended tenotomy and capsulotomy was performed followed release the collateral  ligaments.  Any connection to the flexor tendon was also incised and the partial digit was amputated and passed off the operative site including the middle and distal phalanx.  The DIPJ appeared to be soft and appeared to have exposed bone at the joint consistent with osteomyelitis.  There appeared to be some bleeding from small arterials present to the area which were cauterized with electrocauterization.  Hemostasis was well achieved.  The surgical site was flushed with copious amounts normal sterile saline.  The skin incision was then reapproximated well coapted with 3-0 nylon.  The path specimen was passed off the operative site with proximal margin marked in purple ink.  A postoperative dressing was then applied consisting of Xeroform followed by 4 x 4 gauze, gauze roll, Ace wrap.  The patient tolerated the procedure and anesthesia well and transferred to recovery room vital signs stable vascular status intact all toes of the left foot.  Following period of postoperative monitoring the patient be discharged home with the appropriate orders, medications, and instructions.  Patient is to return to clinic in 1 week for follow-up for further evaluation of incision.  COMPLICATIONS: None  CONDITION: Good, stable  Rosetta Posner, DPM

## 2023-03-02 NOTE — Anesthesia Preprocedure Evaluation (Addendum)
Anesthesia Evaluation  Patient identified by MRN, date of birth, ID band Patient awake    Reviewed: Allergy & Precautions, NPO status , Patient's Chart, lab work & pertinent test results  Airway Mallampati: III  TM Distance: >3 FB Neck ROM: full    Dental  (+) Teeth Intact   Pulmonary neg pulmonary ROS, sleep apnea    Pulmonary exam normal        Cardiovascular Exercise Tolerance: Good hypertension, negative cardio ROS Normal cardiovascular exam Rhythm:Regular Rate:Normal     Neuro/Psych    Depression    TIAnegative neurological ROS  negative psych ROS   GI/Hepatic negative GI ROS, Neg liver ROS, hiatal hernia,GERD  Medicated,,  Endo/Other  diabetes, Type 1, Oral Hypoglycemic Agents    Renal/GU      Musculoskeletal   Abdominal   Peds negative pediatric ROS (+)  Hematology negative hematology ROS (+)   Anesthesia Other Findings Past Medical History: No date: Anemia 2018: Basal cell carcinoma     Comment:  R temple  2002: Basal cell carcinoma of nose     Comment:  bridge of nose No date: BPH with obstruction/lower urinary tract symptoms     Comment:  failed flomax, uroxatral, myrbetriq - sees urology               Patsi Sears) pending videourodynamics 03/11/2012: CKD (chronic kidney disease) stage 3, GFR 30-59 ml/min  (HCC)     Comment:  Renal US 02/2014 - multiple kidney cysts  03/07/2012: CVA (cerebral infarction)     Comment:  slurred speech; right hemplegia, left handed - completed              PT 07/2014 (17 sessions) No date: Dehydration No date: Depression     Comment:  mild, on cymbalta per prior PCP No date: Erectile dysfunction     Comment:  per urology (prostaglandin injection) No date: GERD (gastroesophageal reflux disease)     Comment:  with sliding HH No date: H/O hiatal hernia 12/2010: History of kidney problems     Comment:  lacerated left kidney after fall No date: HLD  (hyperlipidemia) 07/10/2017: Hx of basal cell carcinoma     Comment:  R. lat. forehead near hair line 02/2014: Kidney cysts     Comment:  bilateral (renal US) 03/11/2012: Migraines     Comment:  now rare No date: OSA on CPAP     Comment:  sleep study 01/2014 - severe, CPAP at 10, previously on               nuvigil (Dr. Gareth Morgan) 01/2012: Palpitations     Comment:  holter WNL, rare PAC/PVCs 2016: Presbycusis of both ears     Comment:  rec amplification [facility]) No date: Restless leg syndrome     Comment:  well controlled on mirapex No date: Rosacea     Comment:  Gwen Pounds No date: TIA (transient ischemic attack) 2011: Type 2 diabetes, controlled, with neuropathy (HCC)     Comment:  Dr Sharl Ma in [facility]  Past Surgical History: 09/18/2005: ABI     Comment:  WNL 09/18/2002: CARDIAC CATHETERIZATION     Comment:  normal; [place] 02/16/2014: CARDIOVASCULAR STRESS TEST     Comment:  WNL, low risk scan, EF 68% No date: CAROTID ENDARTERECTOMY     Comment:  pt denies 02/27/23 11//2012 - 08/2011: CATARACT EXTRACTION W/ INTRAOCULAR LENS IMPLANT 03/19/2011: COLONOSCOPY     Comment:  poor prep, unable to biopsy polyp in tic fold, rpt 3  mo               Pearlean Brownie) 06/19/2011: COLONOSCOPY     Comment:  mult polyps adenomatous, severe diverticulosis, rpt 3               yrs Pearlean Brownie) 07/24/2013: CYSTOSCOPY WITH URETHRAL DILATATION; N/A     Comment:  Procedure: CYSTOSCOPY WITH URETHRAL DILATATION;                Surgeon: Kathi Ludwig, MD;  Location: [facility] ORS;                Service: Urology;  Laterality: N/A; 09/18/2004: ESOPHAGOGASTRODUODENOSCOPY     Comment:  duodenitis, medual HH, Grade 2 reflux, mild gastritis 07/20/2011: ESOPHAGOGASTRODUODENOSCOPY     Comment:  mild chronic gastritis 09/18/1989: HEMORRHOID SURGERY 1981; 2002: INGUINAL HERNIA REPAIR     Comment:  left, then repaired 04/18/2010: MRI brain     Comment:  WNL 02/16/2009: PROSTATE SURGERY     Comment:  PVP  (laser) 09/18/2000: SKIN CANCER EXCISION     Comment:  bridge of nose, BCC ~ 1945: TONSILLECTOMY AND ADENOIDECTOMY 07/24/2013: TRANSURETHRAL INCISION OF PROSTATE; N/A     Comment:  Procedure: TRANSURETHRAL INCISION OF THE PROSTATE               (TUIP);  Surgeon: Kathi Ludwig, MD;  Location: [facility]              ORS;  Service: Urology;  Laterality: N/A; 09/18/2012: URETHRAL DILATION     Comment:  tannenbaum     Reproductive/Obstetrics negative OB ROS                             Anesthesia Physical Anesthesia Plan  ASA: 3  Anesthesia Plan: Regional   Post-op Pain Management:    Induction:   PONV Risk Score and Plan:   Airway Management Planned: Natural Airway  Additional Equipment:   Intra-op Plan:   Post-operative Plan:   Informed Consent: I have reviewed the patients History and Physical, chart, labs and discussed the procedure including the risks, benefits and alternatives for the proposed anesthesia with the patient or authorized representative who has indicated his/her understanding and acceptance.       Plan Discussed with: CRNA and Surgeon  Anesthesia Plan Comments:        Anesthesia Quick Evaluation

## 2023-03-02 NOTE — Transfer of Care (Signed)
Immediate Anesthesia Transfer of Care Note  Patient: James Sarks, MD  Procedure(s) Performed: 16109 - TOE INTERPHALANGEAL JOINT - SECOND (Left: Toe)  Patient Location: PACU  Anesthesia Type:General  Level of Consciousness: awake and alert   Airway & Oxygen Therapy: Patient Spontanous Breathing  Post-op Assessment: Report given to RN and Post -op Vital signs reviewed and stable  Post vital signs: Reviewed and stable  Last Vitals:  Vitals Value Taken Time  BP 144/73 03/02/23 1302  Temp 36.4 C 03/02/23 1302  Pulse 63 03/02/23 1309  Resp 18 03/02/23 1309  SpO2 100 % 03/02/23 1309  Vitals shown include unvalidated device data.  Last Pain:  Vitals:   03/02/23 1046  TempSrc: Temporal  PainSc: 0-No pain         Complications: No notable events documented.

## 2023-03-02 NOTE — Anesthesia Postprocedure Evaluation (Signed)
Anesthesia Post Note  Patient: [name], MD  Procedure(s) Performed: 16109 - TOE INTERPHALANGEAL JOINT - SECOND (Left: Toe)  Patient location during evaluation: PACU Anesthesia Type: Regional Level of consciousness: awake and awake and alert Pain management: satisfactory to patient Vital Signs Assessment: post-procedure vital signs reviewed and stable Respiratory status: spontaneous breathing and nonlabored ventilation Cardiovascular status: stable Anesthetic complications: no   No notable events documented.   Last Vitals:  Vitals:   03/02/23 1046 03/02/23 1302  BP: (!) 142/72 (!) 144/73  Pulse: 66 62  Resp: 17 11  Temp: (!) 36.2 C 36.4 C  SpO2: 100% 96%    Last Pain:  Vitals:   03/02/23 1046  TempSrc: Temporal  PainSc: 0-No pain                 VAN STAVEREN,Telena Peyser

## 2023-03-02 NOTE — Discharge Instructions (Addendum)
[facility]  POST OPERATIVE INSTRUCTIONS FOR DR. FOWLER AND [facility] PODIATRY DEPARTMENT   Take your medication as prescribed.  Pain medication should be taken only as needed.  Keep the dressing clean, dry and intact.  Keep your foot elevated above the heart level for the first 48 hours.  Walking to the bathroom and brief periods of walking are acceptable, unless we have instructed you to be non-weight bearing.  Always wear your post-op shoe when walking.  Always use your crutches if you are to be non-weight bearing.  Do not take a shower. Baths are permissible as long as the foot is kept out of the water.   Every hour you are awake:  Bend your knee 15 times. Flex foot 15 times Massage calf 15 times  Call [facility] ([phone]) if any of the following problems occur: You develop a temperature or fever. The bandage becomes saturated with blood. Medication does not stop your pain. Injury of the foot occurs. Any symptoms of infection including redness, odor, or red streaks running from wound.AMBULATORY SURGERY  DISCHARGE INSTRUCTIONS   The drugs that you were given will stay in your system until tomorrow so for the next 24 hours you should not:  Drive an automobile Make any legal decisions Drink any alcoholic beverage   You may resume regular meals tomorrow.  Today it is better to start with liquids and gradually work up to solid foods.  You may eat anything you prefer, but it is better to start with liquids, then soup and crackers, and gradually work up to solid foods.   Please notify your doctor immediately if you have any unusual bleeding, trouble breathing, redness and pain at the surgery site, drainage, fever, or pain not relieved by medication.    Additional Instructions:        Please contact your physician with any problems or Same Day Surgery at [phone], Monday through Friday 6 am  to 4 pm, or [facility] Main number at [phone]. 

## 2023-03-03 ENCOUNTER — Encounter: Payer: Self-pay | Admitting: Podiatry

## 2023-03-03 DIAGNOSIS — R2689 Other abnormalities of gait and mobility: Secondary | ICD-10-CM | POA: Diagnosis not present

## 2023-03-03 DIAGNOSIS — Z9181 History of falling: Secondary | ICD-10-CM | POA: Diagnosis not present

## 2023-03-03 DIAGNOSIS — R2681 Unsteadiness on feet: Secondary | ICD-10-CM | POA: Diagnosis not present

## 2023-03-05 DIAGNOSIS — R2689 Other abnormalities of gait and mobility: Secondary | ICD-10-CM | POA: Diagnosis not present

## 2023-03-05 DIAGNOSIS — Z9181 History of falling: Secondary | ICD-10-CM | POA: Diagnosis not present

## 2023-03-05 DIAGNOSIS — R2681 Unsteadiness on feet: Secondary | ICD-10-CM | POA: Diagnosis not present

## 2023-03-06 ENCOUNTER — Ambulatory Visit (INDEPENDENT_AMBULATORY_CARE_PROVIDER_SITE_OTHER): Payer: Medicare PPO | Admitting: Family Medicine

## 2023-03-06 ENCOUNTER — Encounter: Payer: Self-pay | Admitting: Family Medicine

## 2023-03-06 VITALS — BP 136/64 | HR 73 | Temp 97.6°F | Ht 68.0 in

## 2023-03-06 DIAGNOSIS — I69351 Hemiplegia and hemiparesis following cerebral infarction affecting right dominant side: Secondary | ICD-10-CM | POA: Diagnosis not present

## 2023-03-06 DIAGNOSIS — R413 Other amnesia: Secondary | ICD-10-CM | POA: Diagnosis not present

## 2023-03-06 DIAGNOSIS — G2581 Restless legs syndrome: Secondary | ICD-10-CM

## 2023-03-06 DIAGNOSIS — E1165 Type 2 diabetes mellitus with hyperglycemia: Secondary | ICD-10-CM

## 2023-03-06 DIAGNOSIS — K1329 Other disturbances of oral epithelium, including tongue: Secondary | ICD-10-CM | POA: Diagnosis not present

## 2023-03-06 DIAGNOSIS — E11621 Type 2 diabetes mellitus with foot ulcer: Secondary | ICD-10-CM

## 2023-03-06 DIAGNOSIS — Z89422 Acquired absence of other left toe(s): Secondary | ICD-10-CM | POA: Diagnosis not present

## 2023-03-06 DIAGNOSIS — S82891A Other fracture of right lower leg, initial encounter for closed fracture: Secondary | ICD-10-CM | POA: Diagnosis not present

## 2023-03-06 DIAGNOSIS — L97521 Non-pressure chronic ulcer of other part of left foot limited to breakdown of skin: Secondary | ICD-10-CM

## 2023-03-06 DIAGNOSIS — Z79899 Other long term (current) drug therapy: Secondary | ICD-10-CM

## 2023-03-06 DIAGNOSIS — Z794 Long term (current) use of insulin: Secondary | ICD-10-CM

## 2023-03-06 DIAGNOSIS — G4733 Obstructive sleep apnea (adult) (pediatric): Secondary | ICD-10-CM

## 2023-03-06 MED ORDER — NYSTATIN 100000 UNIT/ML MT SUSP
5.0000 mL | Freq: Four times a day (QID) | OROMUCOSAL | 0 refills | Status: DC
Start: 2023-03-06 — End: 2023-08-13

## 2023-03-06 MED ORDER — NYSTATIN 100000 UNIT/GM EX CREA: 1.0000 | TOPICAL_CREAM | Freq: Two times a day (BID) | CUTANEOUS | 0 refills | Status: DC

## 2023-03-06 NOTE — Patient Instructions (Addendum)
We will refer you back to Dr Ignacia Marvel neurology for follow up on restless legs.  Nystatin cream sent to pharmacy as well as nystatin solution for possible thrush.  Continue current medicines.  Good to see you today  Return in 5 months for physical/wellness visit

## 2023-03-06 NOTE — Progress Notes (Unsigned)
Ph: [phone] Fax: [phone]   Patient ID: James Sarks, MD, male    DOB: 05-03-1931, 89 y.o.   MRN: [id number]  This visit was conducted in person.  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 Pt accompanied by caregiver, Lamar Laundry and daughter, Aurea Graff. )   Lives at [facility] in [place].  H/o L subcortical infarct with resultant chronic R hemiparesis 2013.    Recent L second toe partial amputation for ulcer with concern for osteomyelitis on Friday by Dr Excell Seltzer. He did receive cardiac clearance for procedure.   Has seen Dr Martha Clan at [facility] for closed bimalleolar fracture of R ankle.  RLS with severe symptoms - continues high dose gabapentin 600mg  QID (max renal dose recommended is 900mg /day) and mirapex 1mg  daily at 4pm. Neurology suggested trial Neupro patch. Previously saw neurology Dr Mindi Junker at [facility] s/p iron infusion 2021.   Continues seeing neurology for OSA on CPAP.   Recent geriatric assessment 10/2022 reassuring MMSE 28/30, CDT 4/4. Remains dependent in IADLs due to h/o stroke. Medications that could affect memory - gabapentin, cymbalta, mirapex, insulin if associated with hypoglycemia.   DM - followed by Dr Durene Fruits endo on ozempic tresiba and novolog SSI. Also uses Freestyle Libre 2 CGM.   H/o bradycardia with intermittent mobitz type II 2nd degree AVB - followed by cardiology Dr Kirke Corin.   Notes penile rash - requests nystatin cream refilled Notes change in taste for the past 1+ yr.      Relevant past medical, surgical, family and social history reviewed and updated as indicated. Interim medical history since our last visit reviewed. Allergies and medications reviewed and updated. Outpatient Medications  Prior to Visit  Medication Sig Dispense Refill   Alpha-Lipoic Acid 600 MG CAPS Take 1 capsule (600 mg total) by mouth daily. 30 capsule 11   brimonidine-timolol (COMBIGAN) 0.2-0.5 % ophthalmic solution Place 1 drop into both eyes every 12 (twelve) hours.     chlorpheniramine-HYDROcodone (TUSSIONEX) 10-8 MG/5ML Take 5 mLs by mouth at bedtime as needed for cough. 70 mL 0   clopidogrel (PLAVIX) 75 MG tablet TAKE 1 TABLET DAILY 90 tablet 1   Coenzyme Q10 (COQ10) 150 MG CAPS Take 1 capsule by mouth daily.     DULoxetine (CYMBALTA) 60 MG capsule TAKE 1 CAPSULE DAILY (Patient taking differently: Take 60 mg by mouth at bedtime.) 90 capsule 3   famotidine (PEPCID) 20 MG tablet Take 1 tablet (20 mg total) by mouth at bedtime.     fluticasone (FLOVENT HFA) 110 MCG/ACT inhaler Inhale 1 puff into the lungs in the morning and at bedtime. 1 each 1   gabapentin (NEURONTIN) 300 MG capsule Take 600 mg by mouth 4 (four) times daily. 600 mg BID is his minimum dose without having breakthrough symptoms. He is aware 900 mg/day is recommended per kidney function. 540 capsule 1   insulin aspart (NOVOLOG) 100 UNIT/ML injection CBG 70 - 120: 0 units  CBG 121 - 150: 1 unit  CBG 151 - 200: 2 units  CBG 201 - 250: 3 units  CBG 251 - 300: 5 units  CBG 301 - 350: 7 units  CBG 351 - 400: 9 units 10 mL 11   insulin degludec (TRESIBA FLEXTOUCH) 200 UNIT/ML FlexTouch Pen Inject 90 Units into the skin at bedtime.     Insulin Pen Needle (ULTICARE SHORT PEN NEEDLES) 31G X 8 MM MISC USE TO INJECT MEDICATION DAILY 100 each 3   levothyroxine (SYNTHROID) 50 MCG tablet Take 1 tablet (50 mcg total) by mouth daily before breakfast. Per endo Dr Sharl Ma     mupirocin ointment (BACTROBAN) 2 % Place 1 application. into the nose 2 (two) times daily. 22 g 0   nitroGLYCERIN (NITROSTAT) 0.4 MG SL tablet DISSOLVE 1 TABLET UNDER THE TONGUE EVERY 5 MINUTES AS NEEDED FOR CHEST PAIN 25 tablet 1   ondansetron (ZOFRAN) 4 MG tablet Take 4 mg by mouth daily  as needed for nausea or vomiting.     polyethylene glycol (MIRALAX / GLYCOLAX) 17 g packet Take 17 g by mouth daily as needed. 14 each 0   pramipexole (MIRAPEX) 1 MG tablet TAKE 1 TABLET DAILY AT 4 P.M. 90 tablet 3   RABEprazole (ACIPHEX) 20 MG tablet Take 1 tablet (20 mg total) by mouth daily. 90 tablet 3   rosuvastatin (CRESTOR) 40 MG tablet TAKE 1 TABLET DAILY (Patient taking differently: Take 40 mg by mouth at bedtime.) 90 tablet 3   Semaglutide, 1 MG/DOSE, (OZEMPIC, 1 MG/DOSE,) 4 MG/3ML SOPN Inject 1 mg into the skin once a week. Saturday     sennosides-docusate sodium (SENOKOT-S) 8.6-50 MG tablet Take 1 tablet by mouth daily.     sodium chloride (MURO 128) 5 % ophthalmic solution 1 drop as needed for eye irritation.     tamsulosin (FLOMAX) 0.4 MG CAPS capsule TAKE 1 CAPSULE DAILY AFTER BREAKFAST 90 capsule 3   traMADol (ULTRAM) 50 MG tablet Take 1 tablet (50 mg total) by mouth every 6 (six) hours as needed for up to 7 days for moderate pain or severe pain. 28 tablet 0   vitamin C (ASCORBIC ACID) 500 MG tablet Take 1,000 mg by mouth daily as needed.     fluconazole (DIFLUCAN) 150 MG tablet Take 1 tablet (150 mg total) by mouth once a week. (Patient taking differently: Take 150 mg by mouth once a week. Saturday) 2 tablet 0   No facility-administered medications prior to visit.     Per HPI unless specifically indicated in ROS section below Review of Systems  Objective:  BP 136/64   Pulse 73   Temp 97.6 F (36.4 C) (Temporal)   Ht 5\' 8"  (1.727 m)   SpO2 96%   BMI 28.89 kg/m   Wt Readings from Last 3 Encounters:  02/27/23 190 lb (86.2 kg)  12/21/22 201 lb 1 oz (91.2 kg)  12/13/22 201 lb 3.2 oz (91.3 kg)      Physical Exam Vitals and nursing note reviewed.  Constitutional:      Appearance: Normal appearance. He is not ill-appearing.     Comments: Sitting in wheelchair  HENT:     Mouth/Throat:     Mouth: Mucous membranes are moist.     Pharynx: No oropharyngeal exudate or  posterior oropharyngeal erythema.     Comments: White patches to tongue, none to oropharynx Cardiovascular:     Rate and Rhythm: Normal rate and regular rhythm.     Pulses: Normal pulses.     Heart sounds: Normal heart sounds. No murmur heard. Pulmonary:     Effort: Pulmonary effort is normal. No respiratory distress.     Breath sounds: Normal breath  sounds. No wheezing, rhonchi or rales.  Musculoskeletal:     Right lower leg: Edema present.     Left lower leg: Edema (2+) present.     Comments:  R foot in CAM walker boot L foot in bandage and postop shoe without streaking redness  Neurological:     Mental Status: He is alert.        Lab Results  Component Value Date   CREATININE 1.48 (H) 02/27/2023   BUN 27 (H) 02/27/2023   NA 133 (L) 02/27/2023   K 3.6 02/27/2023   CL 102 02/27/2023   CO2 21 (L) 02/27/2023   GFR = 45 Lab Results  Component Value Date   HGBA1C 6.7 (H) 02/27/2023    Assessment & Plan:   Problem List Items Addressed This Visit     Hemiparesis affecting right side as late effect of cerebrovascular accident (CVA) (HCC)    Chronic, stable period with caregiver Sonya assistance.       Type 2 diabetes mellitus with hyperglycemia (HCC)    Appreciate endo care - continue current regimen including tresiba, ozempic, novolog SSI, using Freestyle Libre 2 CGM.      OSA on CPAP    This is followed by [facility] neurology (Dr Jorja Loa)      Restless leg syndrome, severe - Primary    Severe. Current regimen includes gabapentin 600mg  QID, mirapex 1mg  taken at 4pm. Aware max gabapentin should be 900 mg/day renally dosed for GFR 45.  Neurology suggested trial Neupro - I have not used this medication previously.  Will refer back to [facility] Neurology Dr Mindi Junker - last seen 05/2020.       Relevant Orders   Ambulatory referral to Neurology   Polypharmacy   Diabetic ulcer of toe associated with type 2 diabetes mellitus, limited to breakdown of skin (HCC)     Chronic, recently completed partial 2nd toe amputation for this.       Memory deficit    Reassuring recent evaluation with MMSE 28/30, CDT 4/4.  Remains dependent in IADLs due to h/o stroke.  Medications that could affect memory - gabapentin, cymbalta, mirapex, insulin if associated with hypoglycemia.       History of partial ray amputation of second toe of left foot (HCC)    Seems to be recovering well. Has podiatry f/u on Friday. Reviewed signs to need sooner evaluation.       Closed right ankle fracture    R ankle bimalleolar fracture sustained after fall 12/2022, now in CAM walker boot followed by ortho Dr Martha Clan.       White patches on oral mucosa    Predominantly tongue affected - ?thrush - Rx nystatin solution sent to pharmacy.         Meds ordered this encounter  Medications   nystatin (MYCOSTATIN) 100000 UNIT/ML suspension    Sig: Take 5 mLs (500,000 Units total) by mouth 4 (four) times daily.    Dispense:  60 mL    Refill:  0   nystatin cream (MYCOSTATIN)    Sig: Apply 1 Application topically 2 (two) times daily. Apply to affected area    Dispense:  30 g    Refill:  0    Orders Placed This Encounter  Procedures   Ambulatory referral to Neurology    Referral Priority:   Routine    Referral Type:   Consultation    Referral Reason:   Specialty Services Required    Requested Specialty:   Neurology  Number of Visits Requested:   1    Patient Instructions  We will refer you back to Dr Ignacia Marvel neurology for follow up on restless legs.  Nystatin cream sent to pharmacy as well as nystatin solution for possible thrush.  Continue current medicines.  Good to see you today  Return in 5 months for physical/wellness visit   Follow up plan: Return in about 5 months (around 08/06/2023) for medicare wellness visit, annual exam, prior fasting for blood work.  Eustaquio Boyden, MD

## 2023-03-07 DIAGNOSIS — S82891A Other fracture of right lower leg, initial encounter for closed fracture: Secondary | ICD-10-CM | POA: Insufficient documentation

## 2023-03-07 DIAGNOSIS — G4733 Obstructive sleep apnea (adult) (pediatric): Secondary | ICD-10-CM | POA: Diagnosis not present

## 2023-03-07 DIAGNOSIS — M6281 Muscle weakness (generalized): Secondary | ICD-10-CM | POA: Diagnosis not present

## 2023-03-07 DIAGNOSIS — K1329 Other disturbances of oral epithelium, including tongue: Secondary | ICD-10-CM | POA: Insufficient documentation

## 2023-03-07 DIAGNOSIS — Z89422 Acquired absence of other left toe(s): Secondary | ICD-10-CM | POA: Insufficient documentation

## 2023-03-07 NOTE — Assessment & Plan Note (Signed)
Chronic, stable period with caregiver Sonya assistance.

## 2023-03-07 NOTE — Assessment & Plan Note (Signed)
Chronic, recently completed partial 2nd toe amputation for this.

## 2023-03-07 NOTE — Assessment & Plan Note (Signed)
Predominantly tongue affected - ?thrush - Rx nystatin solution sent to pharmacy.

## 2023-03-07 NOTE — Assessment & Plan Note (Signed)
Severe. Current regimen includes gabapentin 600mg  QID, mirapex 1mg  taken at 4pm. Aware max gabapentin should be 900 mg/day renally dosed for GFR 45.  Neurology suggested trial Neupro - I have not used this medication previously.  Will refer back to [facility] Neurology Dr Mindi Junker - last seen 05/2020.

## 2023-03-07 NOTE — Assessment & Plan Note (Signed)
Reassuring recent evaluation with MMSE 28/30, CDT 4/4.  Remains dependent in IADLs due to h/o stroke.  Medications that could affect memory - gabapentin, cymbalta, mirapex, insulin if associated with hypoglycemia.

## 2023-03-07 NOTE — Assessment & Plan Note (Signed)
Seems to be recovering well. Has podiatry f/u on Friday. Reviewed signs to need sooner evaluation.

## 2023-03-07 NOTE — Assessment & Plan Note (Addendum)
R ankle bimalleolar fracture sustained after fall 12/2022, now in CAM walker boot followed by ortho Dr Martha Clan.

## 2023-03-07 NOTE — Assessment & Plan Note (Signed)
This is followed by [facility] neurology (Dr Jorja Loa)

## 2023-03-07 NOTE — Assessment & Plan Note (Signed)
Appreciate endo care - continue current regimen including tresiba, ozempic, novolog SSI, using Freestyle Libre 2 CGM.

## 2023-03-08 ENCOUNTER — Encounter: Payer: Self-pay | Admitting: *Deleted

## 2023-03-08 DIAGNOSIS — R2681 Unsteadiness on feet: Secondary | ICD-10-CM | POA: Diagnosis not present

## 2023-03-08 DIAGNOSIS — M6281 Muscle weakness (generalized): Secondary | ICD-10-CM | POA: Diagnosis not present

## 2023-03-08 DIAGNOSIS — Z9181 History of falling: Secondary | ICD-10-CM | POA: Diagnosis not present

## 2023-03-08 DIAGNOSIS — R2689 Other abnormalities of gait and mobility: Secondary | ICD-10-CM | POA: Diagnosis not present

## 2023-03-09 DIAGNOSIS — B351 Tinea unguium: Secondary | ICD-10-CM | POA: Diagnosis not present

## 2023-03-09 DIAGNOSIS — Z09 Encounter for follow-up examination after completed treatment for conditions other than malignant neoplasm: Secondary | ICD-10-CM | POA: Diagnosis not present

## 2023-03-09 DIAGNOSIS — R2681 Unsteadiness on feet: Secondary | ICD-10-CM | POA: Diagnosis not present

## 2023-03-09 DIAGNOSIS — I890 Lymphedema, not elsewhere classified: Secondary | ICD-10-CM | POA: Diagnosis not present

## 2023-03-09 DIAGNOSIS — L603 Nail dystrophy: Secondary | ICD-10-CM | POA: Diagnosis not present

## 2023-03-09 DIAGNOSIS — E1142 Type 2 diabetes mellitus with diabetic polyneuropathy: Secondary | ICD-10-CM | POA: Diagnosis not present

## 2023-03-09 DIAGNOSIS — Z89422 Acquired absence of other left toe(s): Secondary | ICD-10-CM | POA: Diagnosis not present

## 2023-03-09 DIAGNOSIS — R2689 Other abnormalities of gait and mobility: Secondary | ICD-10-CM | POA: Diagnosis not present

## 2023-03-09 DIAGNOSIS — Z9181 History of falling: Secondary | ICD-10-CM | POA: Diagnosis not present

## 2023-03-12 DIAGNOSIS — M6281 Muscle weakness (generalized): Secondary | ICD-10-CM | POA: Diagnosis not present

## 2023-03-13 DIAGNOSIS — R2681 Unsteadiness on feet: Secondary | ICD-10-CM | POA: Diagnosis not present

## 2023-03-13 DIAGNOSIS — Z9181 History of falling: Secondary | ICD-10-CM | POA: Diagnosis not present

## 2023-03-13 DIAGNOSIS — R2689 Other abnormalities of gait and mobility: Secondary | ICD-10-CM | POA: Diagnosis not present

## 2023-03-14 DIAGNOSIS — M6281 Muscle weakness (generalized): Secondary | ICD-10-CM | POA: Diagnosis not present

## 2023-03-14 DIAGNOSIS — M25572 Pain in left ankle and joints of left foot: Secondary | ICD-10-CM | POA: Diagnosis not present

## 2023-03-14 DIAGNOSIS — S82844A Nondisplaced bimalleolar fracture of right lower leg, initial encounter for closed fracture: Secondary | ICD-10-CM | POA: Diagnosis not present

## 2023-03-14 DIAGNOSIS — M25472 Effusion, left ankle: Secondary | ICD-10-CM | POA: Diagnosis not present

## 2023-03-15 DIAGNOSIS — M6281 Muscle weakness (generalized): Secondary | ICD-10-CM | POA: Diagnosis not present

## 2023-03-16 DIAGNOSIS — I6930 Unspecified sequelae of cerebral infarction: Secondary | ICD-10-CM | POA: Diagnosis not present

## 2023-03-16 DIAGNOSIS — R2681 Unsteadiness on feet: Secondary | ICD-10-CM | POA: Diagnosis not present

## 2023-03-16 DIAGNOSIS — R197 Diarrhea, unspecified: Secondary | ICD-10-CM | POA: Diagnosis not present

## 2023-03-16 DIAGNOSIS — Z9181 History of falling: Secondary | ICD-10-CM | POA: Diagnosis not present

## 2023-03-16 DIAGNOSIS — E785 Hyperlipidemia, unspecified: Secondary | ICD-10-CM | POA: Diagnosis not present

## 2023-03-16 DIAGNOSIS — Z794 Long term (current) use of insulin: Secondary | ICD-10-CM | POA: Diagnosis not present

## 2023-03-16 DIAGNOSIS — E039 Hypothyroidism, unspecified: Secondary | ICD-10-CM | POA: Diagnosis not present

## 2023-03-16 DIAGNOSIS — R2689 Other abnormalities of gait and mobility: Secondary | ICD-10-CM | POA: Diagnosis not present

## 2023-03-16 DIAGNOSIS — E1142 Type 2 diabetes mellitus with diabetic polyneuropathy: Secondary | ICD-10-CM | POA: Diagnosis not present

## 2023-03-19 DIAGNOSIS — E1165 Type 2 diabetes mellitus with hyperglycemia: Secondary | ICD-10-CM | POA: Diagnosis not present

## 2023-03-19 DIAGNOSIS — M6281 Muscle weakness (generalized): Secondary | ICD-10-CM | POA: Diagnosis not present

## 2023-03-21 DIAGNOSIS — M6281 Muscle weakness (generalized): Secondary | ICD-10-CM | POA: Diagnosis not present

## 2023-03-22 DIAGNOSIS — M6281 Muscle weakness (generalized): Secondary | ICD-10-CM | POA: Diagnosis not present

## 2023-03-23 DIAGNOSIS — R2689 Other abnormalities of gait and mobility: Secondary | ICD-10-CM | POA: Diagnosis not present

## 2023-03-23 DIAGNOSIS — R2681 Unsteadiness on feet: Secondary | ICD-10-CM | POA: Diagnosis not present

## 2023-03-23 DIAGNOSIS — Z9181 History of falling: Secondary | ICD-10-CM | POA: Diagnosis not present

## 2023-03-26 DIAGNOSIS — M6281 Muscle weakness (generalized): Secondary | ICD-10-CM | POA: Diagnosis not present

## 2023-03-26 DIAGNOSIS — Z9181 History of falling: Secondary | ICD-10-CM | POA: Diagnosis not present

## 2023-03-26 DIAGNOSIS — R2689 Other abnormalities of gait and mobility: Secondary | ICD-10-CM | POA: Diagnosis not present

## 2023-03-26 DIAGNOSIS — R2681 Unsteadiness on feet: Secondary | ICD-10-CM | POA: Diagnosis not present

## 2023-03-27 DIAGNOSIS — R2689 Other abnormalities of gait and mobility: Secondary | ICD-10-CM | POA: Diagnosis not present

## 2023-03-27 DIAGNOSIS — Z9181 History of falling: Secondary | ICD-10-CM | POA: Diagnosis not present

## 2023-03-27 DIAGNOSIS — R2681 Unsteadiness on feet: Secondary | ICD-10-CM | POA: Diagnosis not present

## 2023-03-28 DIAGNOSIS — M6281 Muscle weakness (generalized): Secondary | ICD-10-CM | POA: Diagnosis not present

## 2023-03-29 DIAGNOSIS — R2681 Unsteadiness on feet: Secondary | ICD-10-CM | POA: Diagnosis not present

## 2023-03-29 DIAGNOSIS — R2689 Other abnormalities of gait and mobility: Secondary | ICD-10-CM | POA: Diagnosis not present

## 2023-03-29 DIAGNOSIS — Z9181 History of falling: Secondary | ICD-10-CM | POA: Diagnosis not present

## 2023-03-29 DIAGNOSIS — M6281 Muscle weakness (generalized): Secondary | ICD-10-CM | POA: Diagnosis not present

## 2023-04-02 DIAGNOSIS — R2689 Other abnormalities of gait and mobility: Secondary | ICD-10-CM | POA: Diagnosis not present

## 2023-04-02 DIAGNOSIS — R2681 Unsteadiness on feet: Secondary | ICD-10-CM | POA: Diagnosis not present

## 2023-04-02 DIAGNOSIS — Z9181 History of falling: Secondary | ICD-10-CM | POA: Diagnosis not present

## 2023-04-03 DIAGNOSIS — M6281 Muscle weakness (generalized): Secondary | ICD-10-CM | POA: Diagnosis not present

## 2023-04-04 DIAGNOSIS — M6281 Muscle weakness (generalized): Secondary | ICD-10-CM | POA: Diagnosis not present

## 2023-04-06 DIAGNOSIS — M6281 Muscle weakness (generalized): Secondary | ICD-10-CM | POA: Diagnosis not present

## 2023-04-06 DIAGNOSIS — G4733 Obstructive sleep apnea (adult) (pediatric): Secondary | ICD-10-CM | POA: Diagnosis not present

## 2023-04-09 DIAGNOSIS — M6281 Muscle weakness (generalized): Secondary | ICD-10-CM | POA: Diagnosis not present

## 2023-04-09 DIAGNOSIS — R2689 Other abnormalities of gait and mobility: Secondary | ICD-10-CM | POA: Diagnosis not present

## 2023-04-09 DIAGNOSIS — R2681 Unsteadiness on feet: Secondary | ICD-10-CM | POA: Diagnosis not present

## 2023-04-09 DIAGNOSIS — Z9181 History of falling: Secondary | ICD-10-CM | POA: Diagnosis not present

## 2023-04-10 DIAGNOSIS — R2681 Unsteadiness on feet: Secondary | ICD-10-CM | POA: Diagnosis not present

## 2023-04-10 DIAGNOSIS — Z9181 History of falling: Secondary | ICD-10-CM | POA: Diagnosis not present

## 2023-04-10 DIAGNOSIS — R2689 Other abnormalities of gait and mobility: Secondary | ICD-10-CM | POA: Diagnosis not present

## 2023-04-10 DIAGNOSIS — Z08 Encounter for follow-up examination after completed treatment for malignant neoplasm: Secondary | ICD-10-CM | POA: Diagnosis not present

## 2023-04-10 DIAGNOSIS — D2261 Melanocytic nevi of right upper limb, including shoulder: Secondary | ICD-10-CM | POA: Diagnosis not present

## 2023-04-10 DIAGNOSIS — L821 Other seborrheic keratosis: Secondary | ICD-10-CM | POA: Diagnosis not present

## 2023-04-10 DIAGNOSIS — D2262 Melanocytic nevi of left upper limb, including shoulder: Secondary | ICD-10-CM | POA: Diagnosis not present

## 2023-04-10 DIAGNOSIS — D225 Melanocytic nevi of trunk: Secondary | ICD-10-CM | POA: Diagnosis not present

## 2023-04-10 DIAGNOSIS — Z85828 Personal history of other malignant neoplasm of skin: Secondary | ICD-10-CM | POA: Diagnosis not present

## 2023-04-11 DIAGNOSIS — Z9181 History of falling: Secondary | ICD-10-CM | POA: Diagnosis not present

## 2023-04-11 DIAGNOSIS — R2681 Unsteadiness on feet: Secondary | ICD-10-CM | POA: Diagnosis not present

## 2023-04-11 DIAGNOSIS — M6281 Muscle weakness (generalized): Secondary | ICD-10-CM | POA: Diagnosis not present

## 2023-04-11 DIAGNOSIS — R2689 Other abnormalities of gait and mobility: Secondary | ICD-10-CM | POA: Diagnosis not present

## 2023-04-13 DIAGNOSIS — M6281 Muscle weakness (generalized): Secondary | ICD-10-CM | POA: Diagnosis not present

## 2023-04-13 DIAGNOSIS — Z9181 History of falling: Secondary | ICD-10-CM | POA: Diagnosis not present

## 2023-04-13 DIAGNOSIS — R2681 Unsteadiness on feet: Secondary | ICD-10-CM | POA: Diagnosis not present

## 2023-04-13 DIAGNOSIS — R2689 Other abnormalities of gait and mobility: Secondary | ICD-10-CM | POA: Diagnosis not present

## 2023-04-16 ENCOUNTER — Other Ambulatory Visit: Payer: Self-pay | Admitting: Family Medicine

## 2023-04-16 DIAGNOSIS — M6281 Muscle weakness (generalized): Secondary | ICD-10-CM | POA: Diagnosis not present

## 2023-04-16 DIAGNOSIS — Z8673 Personal history of transient ischemic attack (TIA), and cerebral infarction without residual deficits: Secondary | ICD-10-CM

## 2023-04-16 DIAGNOSIS — G2581 Restless legs syndrome: Secondary | ICD-10-CM

## 2023-04-17 ENCOUNTER — Telehealth: Payer: Self-pay

## 2023-04-17 DIAGNOSIS — R2689 Other abnormalities of gait and mobility: Secondary | ICD-10-CM | POA: Diagnosis not present

## 2023-04-17 DIAGNOSIS — Z9181 History of falling: Secondary | ICD-10-CM | POA: Diagnosis not present

## 2023-04-17 DIAGNOSIS — R2681 Unsteadiness on feet: Secondary | ICD-10-CM | POA: Diagnosis not present

## 2023-04-17 NOTE — Telephone Encounter (Signed)
Plavix last filled:  12/06/22, #90 Gabapentin last filled:  12/18/22, #540 Last OV:  03/06/23, 4 mo f/u Next OV:  08/13/23, CPE

## 2023-04-17 NOTE — Telephone Encounter (Signed)
Received faxed order form from [employer] [facility] for DM shoes/insierts.   Placed order in Dr. Timoteo Expose box.

## 2023-04-18 DIAGNOSIS — B351 Tinea unguium: Secondary | ICD-10-CM | POA: Diagnosis not present

## 2023-04-18 DIAGNOSIS — E1142 Type 2 diabetes mellitus with diabetic polyneuropathy: Secondary | ICD-10-CM | POA: Diagnosis not present

## 2023-04-18 DIAGNOSIS — L851 Acquired keratosis [keratoderma] palmaris et plantaris: Secondary | ICD-10-CM | POA: Diagnosis not present

## 2023-04-18 NOTE — Telephone Encounter (Addendum)
Filled and in [name]'s box Ensure pt requested

## 2023-04-18 NOTE — Telephone Encounter (Signed)
ERx 

## 2023-04-18 NOTE — Telephone Encounter (Signed)
Spoke with pt asking if he's aware of order. Pt confirms he initiated order for DM shoes.   Faxed order.

## 2023-04-19 DIAGNOSIS — R2689 Other abnormalities of gait and mobility: Secondary | ICD-10-CM | POA: Diagnosis not present

## 2023-04-19 DIAGNOSIS — Z9181 History of falling: Secondary | ICD-10-CM | POA: Diagnosis not present

## 2023-04-19 DIAGNOSIS — R2681 Unsteadiness on feet: Secondary | ICD-10-CM | POA: Diagnosis not present

## 2023-04-19 DIAGNOSIS — E1165 Type 2 diabetes mellitus with hyperglycemia: Secondary | ICD-10-CM | POA: Diagnosis not present

## 2023-04-20 ENCOUNTER — Other Ambulatory Visit (INDEPENDENT_AMBULATORY_CARE_PROVIDER_SITE_OTHER): Payer: Self-pay | Admitting: Podiatry

## 2023-04-20 DIAGNOSIS — M6281 Muscle weakness (generalized): Secondary | ICD-10-CM | POA: Diagnosis not present

## 2023-04-20 DIAGNOSIS — M25511 Pain in right shoulder: Secondary | ICD-10-CM | POA: Diagnosis not present

## 2023-04-20 DIAGNOSIS — I739 Peripheral vascular disease, unspecified: Secondary | ICD-10-CM

## 2023-04-23 DIAGNOSIS — M6281 Muscle weakness (generalized): Secondary | ICD-10-CM | POA: Diagnosis not present

## 2023-04-23 NOTE — Telephone Encounter (Signed)
Jam from the [facility] called over stating that she received the order back however she is needing most recent office notes.

## 2023-04-23 NOTE — Telephone Encounter (Signed)
Spoke with [facility] asking for date of OV notes they received. James Bell confirmed it was from 03/06/23. Informed her that was pt's last OV with [name]. Says she will see if they can use the notes. She will contact us if they need anything further.

## 2023-04-24 DIAGNOSIS — Z9181 History of falling: Secondary | ICD-10-CM | POA: Diagnosis not present

## 2023-04-24 DIAGNOSIS — R2681 Unsteadiness on feet: Secondary | ICD-10-CM | POA: Diagnosis not present

## 2023-04-24 DIAGNOSIS — R2689 Other abnormalities of gait and mobility: Secondary | ICD-10-CM | POA: Diagnosis not present

## 2023-04-25 DIAGNOSIS — M6281 Muscle weakness (generalized): Secondary | ICD-10-CM | POA: Diagnosis not present

## 2023-04-26 DIAGNOSIS — R2689 Other abnormalities of gait and mobility: Secondary | ICD-10-CM | POA: Diagnosis not present

## 2023-04-26 DIAGNOSIS — Z9181 History of falling: Secondary | ICD-10-CM | POA: Diagnosis not present

## 2023-04-26 DIAGNOSIS — M6281 Muscle weakness (generalized): Secondary | ICD-10-CM | POA: Diagnosis not present

## 2023-04-26 DIAGNOSIS — R2681 Unsteadiness on feet: Secondary | ICD-10-CM | POA: Diagnosis not present

## 2023-04-30 DIAGNOSIS — M6281 Muscle weakness (generalized): Secondary | ICD-10-CM | POA: Diagnosis not present

## 2023-04-30 DIAGNOSIS — R2681 Unsteadiness on feet: Secondary | ICD-10-CM | POA: Diagnosis not present

## 2023-04-30 DIAGNOSIS — Z9181 History of falling: Secondary | ICD-10-CM | POA: Diagnosis not present

## 2023-04-30 DIAGNOSIS — R2689 Other abnormalities of gait and mobility: Secondary | ICD-10-CM | POA: Diagnosis not present

## 2023-04-30 NOTE — Telephone Encounter (Signed)
Faxed form and 03/06/23 OV notes to [facility] at [phone].

## 2023-04-30 NOTE — Telephone Encounter (Signed)
[facility] contacted the office again regarding this request, states that they did not receive office visit notes from 6/18, advised that according to [name]'s message from below the office notes were received. [facility] is asking for these to be re faxed, gave an alternate fax number of [phone]

## 2023-05-02 DIAGNOSIS — M6281 Muscle weakness (generalized): Secondary | ICD-10-CM | POA: Diagnosis not present

## 2023-05-03 DIAGNOSIS — M6281 Muscle weakness (generalized): Secondary | ICD-10-CM | POA: Diagnosis not present

## 2023-05-07 DIAGNOSIS — R2681 Unsteadiness on feet: Secondary | ICD-10-CM | POA: Diagnosis not present

## 2023-05-07 DIAGNOSIS — G4733 Obstructive sleep apnea (adult) (pediatric): Secondary | ICD-10-CM | POA: Diagnosis not present

## 2023-05-07 DIAGNOSIS — Z9181 History of falling: Secondary | ICD-10-CM | POA: Diagnosis not present

## 2023-05-07 DIAGNOSIS — R2689 Other abnormalities of gait and mobility: Secondary | ICD-10-CM | POA: Diagnosis not present

## 2023-05-07 DIAGNOSIS — M6281 Muscle weakness (generalized): Secondary | ICD-10-CM | POA: Diagnosis not present

## 2023-05-08 DIAGNOSIS — M6281 Muscle weakness (generalized): Secondary | ICD-10-CM | POA: Diagnosis not present

## 2023-05-09 DIAGNOSIS — M6281 Muscle weakness (generalized): Secondary | ICD-10-CM | POA: Diagnosis not present

## 2023-05-10 ENCOUNTER — Encounter: Payer: Medicare PPO | Attending: Physical Medicine & Rehabilitation | Admitting: Physical Medicine & Rehabilitation

## 2023-05-10 ENCOUNTER — Encounter: Payer: Self-pay | Admitting: Physical Medicine & Rehabilitation

## 2023-05-10 VITALS — BP 147/79 | HR 70 | Ht 68.0 in | Wt 196.0 lb

## 2023-05-10 DIAGNOSIS — G8110 Spastic hemiplegia affecting unspecified side: Secondary | ICD-10-CM | POA: Insufficient documentation

## 2023-05-10 DIAGNOSIS — M21371 Foot drop, right foot: Secondary | ICD-10-CM | POA: Diagnosis not present

## 2023-05-10 NOTE — Patient Instructions (Signed)
I would recommend that PT addresses balance in addition to strengthening  I have written an Rx for orthotist to reassess Right AFO since pt feels like Right foot is still dragging with his current PLS carbon fiber AFO.

## 2023-05-10 NOTE — Progress Notes (Signed)
Subjective:    Patient ID: James Sarks, MD, male    DOB: 1931-05-15, 89 y.o.   MRN: [id number] Dr. Heinig is an 89 year old male with history of left basal ganglia infarct in 2013. HPI Last visit ~63yr ago.  Interval hx , one ED visit on 12/21/22 , for fall.  Also left partial toe amp 03/02/23 for osteomyelitis due to diabetic ulcer infection.   Larey Seat again getting off toilet (without walker)  in May had Right ankle (tibial) non displaced fx treated with casting .   Gets PT and OT ~2d per wk.  Per pt this has been helpful but does not feel balance has been a focus  Also feels Right AFO is not controlling the foot drop adequately  Still in [place]  See podiatry for foot care  Pain Inventory Average Pain 4 Pain Right Now 3 My pain is constant, burning, and aching  In the last 24 hours, has pain interfered with the following? General activity 4 Relation with others 4 Enjoyment of life 4 What TIME of day is your pain at its worst? evening Sleep (in general) Good  Pain is worse with: unsure Pain improves with: heat/ice Relief from Meds: 2  Family History  Problem Relation Age of Onset   Stroke Father    Lung cancer Father 36        smoker   Alcoholism Sister    Kidney disease Sister    Stroke Brother    Diabetes Brother 78   Lung cancer Brother    Coronary artery disease Neg Hx    Sleep apnea Neg Hx    Social History   Socioeconomic History   Marital status: Divorced    Spouse name: Not on file   Number of children: 2   Years of education: MD   Highest education level: Not on file  Occupational History   Occupation: retired    Associate Professor: RETIRED  Tobacco Use   Smoking status: Never    Passive exposure: Never   Smokeless tobacco: Never   Tobacco comments:    "used to smoke pipe when I was youngerSystems developer   Vaping status: Never Used  Substance and Sexual Activity   Alcohol use: Not Currently    Alcohol/week: 4.0 standard drinks of alcohol    Types: 2  Glasses of wine, 2 Shots of liquor per week    Comment: "nonexistent anymore"   Drug use: No   Sexual activity: Yes  Other Topics Concern   Not on file  Social History Narrative   Moved into [facility] in [place] 12/2020   Caffeine: 1 cup coffee/day     Occupation: ophthalmologist, retired   Edu: MD   Activity: going to gym 3x/wk with trainer   Diet: some water, fruits/vegetables some, red meat a few times, fish 1x/wk   POA - Tyrone Schimke (Daughter)   Social Determinants of Health   Financial Resource Strain: Low Risk  (12/30/2020)   Overall Financial Resource Strain (CARDIA)    Difficulty of Paying Living Expenses: Not hard at all  Food Insecurity: Not on file  Transportation Needs: Not on file  Physical Activity: Not on file  Stress: Not on file  Social Connections: Not on file   Past Surgical History:  Procedure Laterality Date   ABI  09/18/2005   WNL   AMPUTATION TOE Left 03/02/2023   Procedure: 40981 - TOE INTERPHALANGEAL JOINT - SECOND;  Surgeon: Rosetta Posner, DPM;  Location[facility]  ORS;  Service: Podiatry;  Laterality: Left;   CARDIAC CATHETERIZATION  09/18/2002   normal; [facility]   CARDIOVASCULAR STRESS TEST  02/16/2014   WNL, low risk scan, EF 68%   CAROTID ENDARTERECTOMY     pt denies 02/27/23   CATARACT EXTRACTION W/ INTRAOCULAR LENS IMPLANT  11//2012 - 08/2011   COLONOSCOPY  03/19/2011   poor prep, unable to biopsy polyp in tic fold, rpt 3 mo Pearlean Brownie)   COLONOSCOPY  06/19/2011   mult polyps adenomatous, severe diverticulosis, rpt 3 yrs Pearlean Brownie)   CYSTOSCOPY WITH URETHRAL DILATATION N/A 07/24/2013   Procedure: CYSTOSCOPY WITH URETHRAL DILATATION;  Surgeon: Kathi Ludwig, MD;  Location: [facility] ORS;  Service: Urology;  Laterality: N/A;   ESOPHAGOGASTRODUODENOSCOPY  09/18/2004   duodenitis, medual HH, Grade 2 reflux, mild gastritis   ESOPHAGOGASTRODUODENOSCOPY  07/20/2011   mild chronic gastritis   HEMORRHOID SURGERY  09/18/1989    INGUINAL HERNIA REPAIR  1981; 2002   left, then repaired   MRI brain  04/18/2010   WNL   PROSTATE SURGERY  02/16/2009   PVP (laser)   SKIN CANCER EXCISION  09/18/2000   bridge of nose, BCC   TONSILLECTOMY AND ADENOIDECTOMY  ~ 1945   TRANSURETHRAL INCISION OF PROSTATE N/A 07/24/2013   Procedure: TRANSURETHRAL INCISION OF THE PROSTATE (TUIP);  Surgeon: Kathi Ludwig, MD;  Location: [facility] ORS;  Service: Urology;  Laterality: N/A;   URETHRAL DILATION  09/18/2012   tannenbaum   Past Surgical History:  Procedure Laterality Date   ABI  09/18/2005   WNL   AMPUTATION TOE Left 03/02/2023   Procedure: 16109 - TOE INTERPHALANGEAL JOINT - SECOND;  Surgeon: Rosetta Posner, DPM;  Location: [facility] ORS;  Service: Podiatry;  Laterality: Left;   CARDIAC CATHETERIZATION  09/18/2002   normal; [facility]   CARDIOVASCULAR STRESS TEST  02/16/2014   WNL, low risk scan, EF 68%   CAROTID ENDARTERECTOMY     pt denies 02/27/23   CATARACT EXTRACTION W/ INTRAOCULAR LENS IMPLANT  11//2012 - 08/2011   COLONOSCOPY  03/19/2011   poor prep, unable to biopsy polyp in tic fold, rpt 3 mo Pearlean Brownie)   COLONOSCOPY  06/19/2011   mult polyps adenomatous, severe diverticulosis, rpt 3 yrs Pearlean Brownie)   CYSTOSCOPY WITH URETHRAL DILATATION N/A 07/24/2013   Procedure: CYSTOSCOPY WITH URETHRAL DILATATION;  Surgeon: Kathi Ludwig, MD;  Location: [facility] ORS;  Service: Urology;  Laterality: N/A;   ESOPHAGOGASTRODUODENOSCOPY  09/18/2004   duodenitis, medual HH, Grade 2 reflux, mild gastritis   ESOPHAGOGASTRODUODENOSCOPY  07/20/2011   mild chronic gastritis   HEMORRHOID SURGERY  09/18/1989   INGUINAL HERNIA REPAIR  1981; 2002   left, then repaired   MRI brain  04/18/2010   WNL   PROSTATE SURGERY  02/16/2009   PVP (laser)   SKIN CANCER EXCISION  09/18/2000   bridge of nose, BCC   TONSILLECTOMY AND ADENOIDECTOMY  ~ 1945   TRANSURETHRAL INCISION OF PROSTATE N/A 07/24/2013   Procedure: TRANSURETHRAL INCISION OF THE PROSTATE  (TUIP);  Surgeon: Kathi Ludwig, MD;  Location: [facility] ORS;  Service: Urology;  Laterality: N/A;   URETHRAL DILATION  09/18/2012   tannenbaum   Past Medical History:  Diagnosis Date   Anemia    Basal cell carcinoma 2018   R temple    Basal cell carcinoma of nose 2002   bridge of nose   BPH with obstruction/lower urinary tract symptoms    failed flomax, uroxatral, myrbetriq - sees urology Patsi Sears) pending videourodynamics  CKD (chronic kidney disease) stage 3, GFR 30-59 ml/min (HCC) 03/11/2012   Renal US 02/2014 - multiple kidney cysts    CVA (cerebral infarction) 03/07/2012   slurred speech; right hemplegia, left handed - completed PT 07/2014 (17 sessions)   Dehydration    Depression    mild, on cymbalta per prior PCP   Erectile dysfunction    per urology (prostaglandin injection)   GERD (gastroesophageal reflux disease)    with sliding HH   H/O hiatal hernia    History of kidney problems 12/2010   lacerated left kidney after fall   HLD (hyperlipidemia)    Hx of basal cell carcinoma 07/10/2017   R. lat. forehead near hair line   Kidney cysts 02/2014   bilateral (renal US)   Migraines 03/11/2012   now rare   OSA on CPAP    sleep study 01/2014 - severe, CPAP at 10, previously on nuvigil (Dr. Gareth Morgan)   Palpitations 01/2012   holter WNL, rare PAC/PVCs   Presbycusis of both ears 2016   rec amplification [facility])   Restless leg syndrome    well controlled on mirapex   Rosacea    Gwen Pounds   TIA (transient ischemic attack)    Type 2 diabetes, controlled, with neuropathy (HCC) 2011   Dr Sharl Ma in [place]   BP (!) 147/79   Pulse 70   Ht 5\' 8"  (1.727 m)   Wt 196 lb (88.9 kg)   SpO2 96%   BMI 29.80 kg/m   Opioid Risk Score:   Fall Risk Score:  `1  Depression screen [facility] 2/9     03/06/2023    9:52 AM 12/13/2022   11:57 AM 10/31/2022   10:11 AM 07/31/2022    3:24 PM 05/09/2022   11:07 AM 06/13/2021    3:52 PM 06/01/2020   12:39 PM  Depression screen PHQ 2/9   Decreased Interest 0 2 3 0 0 3 0  Down, Depressed, Hopeless 0 1 1 0 0 0 0  PHQ - 2 Score 0 3 4 0 0 3 0  Altered sleeping  0 2 1  0 1  Tired, decreased energy  1 2 1  2 1   Change in appetite  0 0 0  0 1  Feeling bad or failure about yourself   0 0 0  0 0  Trouble concentrating  0 1 1  1 1   Moving slowly or fidgety/restless  0 1 0  1 0  Suicidal thoughts  0 0 0  0 0  PHQ-9 Score  4 10 3  7 4   Difficult doing work/chores  Somewhat difficult Somewhat difficult Somewhat difficult        Review of Systems  Musculoskeletal:        RT shoulder  All other systems reviewed and are negative.      Objective:   Physical Exam Vitals and nursing note reviewed.  Constitutional:      Appearance: He is obese.  HENT:     Head: Normocephalic and atraumatic.  Eyes:     Extraocular Movements: Extraocular movements intact.     Conjunctiva/sclera: Conjunctivae normal.     Pupils: Pupils are equal, round, and reactive to light.  Musculoskeletal:     Right lower leg: Edema present.     Left lower leg: Edema present.     Comments: Right AFO in good repair  Skin:    General: Skin is warm and dry.  Neurological:     General: No  focal deficit present.     Mental Status: He is alert and oriented to person, place, and time.     Comments: No walker so ambulation was not tested  Motor 3- RIght delt biceps triceps and grip 4- RIght hip flex knee ext , RIght AFO precluded ankle DF/PF testing  LUE ands LLE 5/5   Sensation reduce to LT below ankles   Psychiatric:        Mood and Affect: Mood normal.        Behavior: Behavior normal.           Assessment & Plan:   RIght spastic hemiparesis due to Left basal ganglia infarct in 2013 stable neuro deficits but still at a fall risk with recent non displaced tibial fx.  Will refer to Orthotist for custom molded AFO RLE to help with clearing toes on left may need toe cap as well  Will ask PT to work on balance as well.    RTC 63mo

## 2023-05-11 DIAGNOSIS — Z9181 History of falling: Secondary | ICD-10-CM | POA: Diagnosis not present

## 2023-05-11 DIAGNOSIS — R2689 Other abnormalities of gait and mobility: Secondary | ICD-10-CM | POA: Diagnosis not present

## 2023-05-11 DIAGNOSIS — R2681 Unsteadiness on feet: Secondary | ICD-10-CM | POA: Diagnosis not present

## 2023-05-14 DIAGNOSIS — Z9181 History of falling: Secondary | ICD-10-CM | POA: Diagnosis not present

## 2023-05-14 DIAGNOSIS — R2689 Other abnormalities of gait and mobility: Secondary | ICD-10-CM | POA: Diagnosis not present

## 2023-05-14 DIAGNOSIS — R2681 Unsteadiness on feet: Secondary | ICD-10-CM | POA: Diagnosis not present

## 2023-05-16 ENCOUNTER — Emergency Department
Admission: EM | Admit: 2023-05-16 | Discharge: 2023-05-16 | Disposition: A | Discharge status: Home / Self Care | Payer: TRICARE For Life (TFL) | Attending: Emergency Medicine | Admitting: Emergency Medicine

## 2023-05-16 ENCOUNTER — Emergency Department: Payer: TRICARE For Life (TFL)

## 2023-05-16 ENCOUNTER — Other Ambulatory Visit: Payer: Self-pay

## 2023-05-16 DIAGNOSIS — W01198A Fall on same level from slipping, tripping and stumbling with subsequent striking against other object, initial encounter: Secondary | ICD-10-CM | POA: Diagnosis not present

## 2023-05-16 DIAGNOSIS — S0990XA Unspecified injury of head, initial encounter: Secondary | ICD-10-CM | POA: Diagnosis not present

## 2023-05-16 DIAGNOSIS — R69 Illness, unspecified: Secondary | ICD-10-CM | POA: Diagnosis not present

## 2023-05-16 DIAGNOSIS — Z7902 Long term (current) use of antithrombotics/antiplatelets: Secondary | ICD-10-CM | POA: Insufficient documentation

## 2023-05-16 DIAGNOSIS — N189 Chronic kidney disease, unspecified: Secondary | ICD-10-CM | POA: Insufficient documentation

## 2023-05-16 DIAGNOSIS — S199XXA Unspecified injury of neck, initial encounter: Secondary | ICD-10-CM | POA: Diagnosis not present

## 2023-05-16 DIAGNOSIS — W19XXXA Unspecified fall, initial encounter: Secondary | ICD-10-CM | POA: Diagnosis not present

## 2023-05-16 DIAGNOSIS — S0083XA Contusion of other part of head, initial encounter: Secondary | ICD-10-CM | POA: Insufficient documentation

## 2023-05-16 DIAGNOSIS — M47812 Spondylosis without myelopathy or radiculopathy, cervical region: Secondary | ICD-10-CM | POA: Diagnosis not present

## 2023-05-16 DIAGNOSIS — M5030 Other cervical disc degeneration, unspecified cervical region: Secondary | ICD-10-CM | POA: Diagnosis not present

## 2023-05-16 DIAGNOSIS — E1122 Type 2 diabetes mellitus with diabetic chronic kidney disease: Secondary | ICD-10-CM | POA: Insufficient documentation

## 2023-05-16 DIAGNOSIS — M4312 Spondylolisthesis, cervical region: Secondary | ICD-10-CM | POA: Diagnosis not present

## 2023-05-16 DIAGNOSIS — R609 Edema, unspecified: Secondary | ICD-10-CM | POA: Diagnosis not present

## 2023-05-16 DIAGNOSIS — Z7401 Bed confinement status: Secondary | ICD-10-CM | POA: Diagnosis not present

## 2023-05-16 DIAGNOSIS — R9431 Abnormal electrocardiogram [ECG] [EKG]: Secondary | ICD-10-CM | POA: Diagnosis not present

## 2023-05-16 LAB — COMPREHENSIVE METABOLIC PANEL
ALT: 14 U/L (ref 0–44)
AST: 21 U/L (ref 15–41)
Albumin: 3.6 g/dL (ref 3.5–5.0)
Alkaline Phosphatase: 43 U/L (ref 38–126)
Anion gap: 5 (ref 5–15)
BUN: 27 mg/dL — ABNORMAL HIGH (ref 8–23)
CO2: 25 mmol/L (ref 22–32)
Calcium: 8.9 mg/dL (ref 8.9–10.3)
Chloride: 107 mmol/L (ref 98–111)
Creatinine, Ser: 1.47 mg/dL — ABNORMAL HIGH (ref 0.61–1.24)
GFR, Estimated: 45 mL/min — ABNORMAL LOW (ref >60)
Glucose, Bld: 89 mg/dL (ref 70–99)
Potassium: 3.9 mmol/L (ref 3.5–5.1)
Sodium: 137 mmol/L (ref 135–145)
Total Bilirubin: 0.8 mg/dL (ref 0.3–1.2)
Total Protein: 7.6 g/dL (ref 6.5–8.1)

## 2023-05-16 LAB — CBC
HCT: 44.1 % (ref 39.0–52.0)
Hemoglobin: 14.7 g/dL (ref 13.0–17.0)
MCH: 29.5 pg (ref 26.0–34.0)
MCHC: 33.3 g/dL (ref 30.0–36.0)
MCV: 88.4 fL (ref 80.0–100.0)
Platelets: 189 10*3/uL (ref 150–400)
RBC: 4.99 MIL/uL (ref 4.22–5.81)
RDW: 13.4 % (ref 11.5–15.5)
WBC: 8.9 10*3/uL (ref 4.0–10.5)
nRBC: 0 % (ref 0.0–0.2)

## 2023-05-16 MED ORDER — ACETAMINOPHEN 500 MG PO TABS
1000.0000 mg | ORAL_TABLET | Freq: Once | ORAL | Status: AC
  Administered 2023-05-16: 1000 mg via ORAL
  Filled 2023-05-16: qty 2

## 2023-05-16 NOTE — ED Notes (Signed)
[facility] called to update about patient returning to facility. This RN gave a report to [place] at the facility.

## 2023-05-16 NOTE — Discharge Instructions (Signed)
Please use Tylenol as needed for discomfort as written on the box.  Please use ice for 20 to 30 minutes applied to your forehead every 2-3 hours for the next day or so to help reduce swelling.  Return to the emergency department for any weakness any numbness, speech changes, or any other symptom concerning to yourself.

## 2023-05-16 NOTE — ED Triage Notes (Signed)
Patient to ED via [facility] from [facility]. Patient states he was getting out of bed this AM and when he went to stood up, he fell. Patient hit his head and now has swelling and redness on the right side of his head. Patient denies LOC and dizziness. Patient does currently take Plavix. Patient has a Hx of stroke with R sided weakness.

## 2023-05-16 NOTE — ED Notes (Signed)
Patient presented to ED with a headache but his pain has progressively decreased.

## 2023-05-16 NOTE — ED Provider Notes (Signed)
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  Patient lives at [facility] independent living facility.  States he was getting out of bed this morning when he missed stepped causing him to fall forward hitting his head on the ground.  Denies LOC denies vomiting.  Patient does take Plavix.  States a history of right-sided weakness, per record review there is also noted slurred speech.  Speech largely appears normal.  Physical Exam   Triage Vital Signs: ED Triage Vitals  Encounter Vitals Group     BP 05/16/23 0751 (!) 152/74     Systolic BP Percentile --      Diastolic BP Percentile --      Pulse Rate 05/16/23 0751 70     Resp 05/16/23 0751 16     Temp 05/16/23 0751 99.2 F (37.3 C)     Temp Source 05/16/23 0751 Oral     SpO2 05/16/23 0746 98 %     Weight 05/16/23 0752 190 lb (86.2 kg)     Height 05/16/23 0752 5\' 8"  (1.727 m)     Head Circumference --      Peak Flow --      Pain Score 05/16/23 0751 4     Pain Loc --      Pain Education --      Exclude from Growth Chart --     Most recent vital signs: Vitals:   05/16/23 0750 05/16/23 0751  BP:  (!) 152/74  Pulse:  70  Resp:  16  Temp:  99.2 F (37.3 C)  SpO2: 100% 100%    General: Awake, no distress.  Moderate to large forehead hematoma with small abrasion.  No laceration. CV:  Good peripheral perfusion.  Regular rate and rhythm  Resp:  Normal effort.  Equal breath sounds bilaterally.  Abd:  No distention.  Soft, nontender.  No rebound or guarding. Other:  Good range of motion in all extremities with no pain elicited.   ED Results / Procedures /  Treatments   EKG  EKG viewed and interpreted by myself shows what appears to be a sinus rhythm at 120 bpm with a widened QRS, left axis deviation, largely normal intervals with nonspecific ST changes  RADIOLOGY  I have reviewed and interpreted CT head images.  No bleed seen on my evaluation. Radiology has read the head and C-spine CT is negative for acute abnormality  MEDICATIONS ORDERED IN ED: Medications - No data to display   IMPRESSION / MDM / ASSESSMENT AND PLAN / ED COURSE  I reviewed the triage vital signs and the nursing notes.  Patient's presentation is most consistent with acute presentation with potential threat to life or bodily function.  Patient presents to the emergency department after a mechanical fall falling forwards.  Patient does have a large forehead hematoma and a small abrasion.  History of a prior CVA with some difficulty walking at baseline per patient.  Given the patient's large hematoma on Plavix we will obtain CT imaging of the head and C-spine as a precaution we will check basic labs and continue to closely monitor.  Patient agreeable to plan of care.  Patient CBC is reassuring, chemistry shows no concerning findings.  CT scan of the head and C-spine are negative for acute abnormality.  Discussed Tylenol as needed for headache as written on the box, ice to the area every 2 hours for 20 to 30 minutes to help reduce swelling.  Patient agreeable to plan.  FINAL CLINICAL IMPRESSION(S) / ED DIAGNOSES   Fall Head injury   Note:  This document was prepared using Dragon voice recognition software and may include unintentional dictation errors.   Minna Antis, MD 05/16/23 1046

## 2023-05-17 ENCOUNTER — Telehealth: Payer: Self-pay | Admitting: Family Medicine

## 2023-05-17 NOTE — Telephone Encounter (Signed)
error 

## 2023-05-18 DIAGNOSIS — Z9181 History of falling: Secondary | ICD-10-CM | POA: Diagnosis not present

## 2023-05-18 DIAGNOSIS — R2681 Unsteadiness on feet: Secondary | ICD-10-CM | POA: Diagnosis not present

## 2023-05-18 DIAGNOSIS — R2689 Other abnormalities of gait and mobility: Secondary | ICD-10-CM | POA: Diagnosis not present

## 2023-05-18 DIAGNOSIS — M6281 Muscle weakness (generalized): Secondary | ICD-10-CM | POA: Diagnosis not present

## 2023-05-20 DIAGNOSIS — E1165 Type 2 diabetes mellitus with hyperglycemia: Secondary | ICD-10-CM | POA: Diagnosis not present

## 2023-05-22 DIAGNOSIS — M6281 Muscle weakness (generalized): Secondary | ICD-10-CM | POA: Diagnosis not present

## 2023-05-23 DIAGNOSIS — R2689 Other abnormalities of gait and mobility: Secondary | ICD-10-CM | POA: Diagnosis not present

## 2023-05-23 DIAGNOSIS — Z9181 History of falling: Secondary | ICD-10-CM | POA: Diagnosis not present

## 2023-05-23 DIAGNOSIS — R2681 Unsteadiness on feet: Secondary | ICD-10-CM | POA: Diagnosis not present

## 2023-05-24 ENCOUNTER — Ambulatory Visit (INDEPENDENT_AMBULATORY_CARE_PROVIDER_SITE_OTHER): Payer: Medicare PPO | Admitting: Family Medicine

## 2023-05-24 ENCOUNTER — Ambulatory Visit (INDEPENDENT_AMBULATORY_CARE_PROVIDER_SITE_OTHER)
Admission: RE | Admit: 2023-05-24 | Discharge: 2023-05-24 | Disposition: A | Discharge status: Home / Self Care | Payer: Medicare PPO | Source: Ambulatory Visit | Attending: Family Medicine | Admitting: Family Medicine

## 2023-05-24 ENCOUNTER — Encounter: Payer: Self-pay | Admitting: Family Medicine

## 2023-05-24 VITALS — BP 126/72 | HR 73 | Temp 97.5°F | Ht 68.0 in | Wt 196.2 lb

## 2023-05-24 DIAGNOSIS — H532 Diplopia: Secondary | ICD-10-CM

## 2023-05-24 DIAGNOSIS — R0689 Other abnormalities of breathing: Secondary | ICD-10-CM

## 2023-05-24 DIAGNOSIS — M6281 Muscle weakness (generalized): Secondary | ICD-10-CM | POA: Diagnosis not present

## 2023-05-24 DIAGNOSIS — R9389 Abnormal findings on diagnostic imaging of other specified body structures: Secondary | ICD-10-CM | POA: Diagnosis not present

## 2023-05-24 DIAGNOSIS — I700 Atherosclerosis of aorta: Secondary | ICD-10-CM | POA: Diagnosis not present

## 2023-05-24 DIAGNOSIS — Z9181 History of falling: Secondary | ICD-10-CM | POA: Diagnosis not present

## 2023-05-24 DIAGNOSIS — G8110 Spastic hemiplegia affecting unspecified side: Secondary | ICD-10-CM

## 2023-05-24 DIAGNOSIS — S0990XA Unspecified injury of head, initial encounter: Secondary | ICD-10-CM

## 2023-05-24 DIAGNOSIS — G2581 Restless legs syndrome: Secondary | ICD-10-CM

## 2023-05-24 DIAGNOSIS — R2689 Other abnormalities of gait and mobility: Secondary | ICD-10-CM | POA: Diagnosis not present

## 2023-05-24 DIAGNOSIS — R0989 Other specified symptoms and signs involving the circulatory and respiratory systems: Secondary | ICD-10-CM | POA: Diagnosis not present

## 2023-05-24 DIAGNOSIS — Z23 Encounter for immunization: Secondary | ICD-10-CM | POA: Diagnosis not present

## 2023-05-24 DIAGNOSIS — I69351 Hemiplegia and hemiparesis following cerebral infarction affecting right dominant side: Secondary | ICD-10-CM

## 2023-05-24 DIAGNOSIS — Z89422 Acquired absence of other left toe(s): Secondary | ICD-10-CM

## 2023-05-24 DIAGNOSIS — E11621 Type 2 diabetes mellitus with foot ulcer: Secondary | ICD-10-CM

## 2023-05-24 DIAGNOSIS — Z794 Long term (current) use of insulin: Secondary | ICD-10-CM

## 2023-05-24 DIAGNOSIS — R918 Other nonspecific abnormal finding of lung field: Secondary | ICD-10-CM | POA: Diagnosis not present

## 2023-05-24 DIAGNOSIS — H50011 Monocular esotropia, right eye: Secondary | ICD-10-CM

## 2023-05-24 DIAGNOSIS — W19XXXD Unspecified fall, subsequent encounter: Secondary | ICD-10-CM

## 2023-05-24 DIAGNOSIS — L97521 Non-pressure chronic ulcer of other part of left foot limited to breakdown of skin: Secondary | ICD-10-CM

## 2023-05-24 DIAGNOSIS — E1165 Type 2 diabetes mellitus with hyperglycemia: Secondary | ICD-10-CM | POA: Diagnosis not present

## 2023-05-24 DIAGNOSIS — G4733 Obstructive sleep apnea (adult) (pediatric): Secondary | ICD-10-CM

## 2023-05-24 DIAGNOSIS — R2681 Unsteadiness on feet: Secondary | ICD-10-CM | POA: Diagnosis not present

## 2023-05-24 MED ORDER — ONDANSETRON HCL 4 MG PO TABS: 4.0000 mg | ORAL_TABLET | Freq: Every day | ORAL | 0 refills | Status: DC | PRN

## 2023-05-24 NOTE — Progress Notes (Signed)
Ph: [phone] Fax: [phone]   Patient ID: James Sarks, MD, male    DOB: 05-22-1931, 89 y.o.   MRN: [id number]  This visit was conducted in person.  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 Also, pt states he takes Neurontin QID- needs new rx. Wants to discuss neuro referral to [place] vs [facility]. Pt accompanied by caregiver, James Bell. )   Lives at [facility] in [place]. Continues PT/OT through [facility].  H/o L subcortical infarct with resultant chronic R hemiparesis 2013.   H/o closed bimalleolar fracture of R ankle - has seen Dr Martha Clan [facility]  H/o bradycardia with intermittent mobitz type II 2nd degree AVB - followed by cardiology Dr Kirke Corin.   Recently seen at ER after fall at ILF - slipped while getting out of bed, fell forward and hit head on ground.  ER records reviewed. Found to have large forehead hematoma with small abrasion.  Overall reassuring evaluation including head and cervical spine CT.   Since home from hospital has had some blurred vision and intermittent horizontal double vision, R eye esotropia. Upcoming eye exam next week. No LOC, new memory loss.  He feels he may have had a concussion - with residual frontal headache. He notes new confusion episodes ie walking into bathroom and not remembering why.  Reading caused double vision so he stopped.   Restless leg syndrome on Mirapex 1mg  at 4pm and gabapentin 600mg  prescribed TID - pt states he's taking QID otherwise legs start contracting (posterior thigh, calves) and feet start burning.  Latest GFR 45 - aware max gabapentin recommended dose is 900mg /day. Previously saw neurology Dr Mindi Junker at [facility] s/p  iron infusion 2021.  Lab Results  Component Value Date   IRON 76 07/31/2022   TIBC 301.0 07/31/2022   FERRITIN 96.5 07/31/2022    Lab Results  Component Value Date   TSH 2.71 07/31/2022  Only drinking 1 cup coffee/day  Sees [facility] for OSA on CPAP.  02/2023 was referred back to [facility] neurology for RLS - has not heard from them yet.  Medications that could affect memory - gabapentin, cymbalta, mirapex   Incidental lung finding at time of COVID hospitalization with syncope due to hypoglycemia 12/2021 - bandline spiculated opacity to RML (12mm)  Denies dyspnea, cough, fever, unexpected weight loss.      Relevant past medical, surgical, family and social history reviewed and updated as indicated. Interim medical history since our last visit reviewed. Allergies and medications reviewed and updated. Outpatient Medications Prior to Visit  Medication Sig Dispense Refill   Alpha-Lipoic Acid 600 MG CAPS Take 1 capsule (600 mg total) by mouth daily. 30 capsule 11   brimonidine-timolol (COMBIGAN) 0.2-0.5 % ophthalmic solution Place 1 drop into both eyes every 12 (twelve) hours.     clopidogrel (PLAVIX) 75 MG tablet TAKE 1 TABLET DAILY 90 tablet 1   Coenzyme Q10 (COQ10) 150 MG CAPS Take 1 capsule by mouth daily.     DULoxetine (CYMBALTA) 60 MG capsule TAKE 1 CAPSULE DAILY (Patient taking differently: Take 60 mg by mouth at bedtime.)  90 capsule 3   famotidine (PEPCID) 20 MG tablet Take 1 tablet (20 mg total) by mouth at bedtime.     fluticasone (FLOVENT HFA) 110 MCG/ACT inhaler Inhale 1 puff into the lungs in the morning and at bedtime. 1 each 1   gabapentin (NEURONTIN) 300 MG capsule Take 2 capsules (600 mg total) by mouth 3 (three) times daily. 540 capsule 1   insulin aspart (NOVOLOG) 100 UNIT/ML injection CBG 70 - 120: 0 units  CBG 121 - 150: 1 unit  CBG 151 - 200: 2 units  CBG 201 - 250: 3 units  CBG 251 - 300: 5 units  CBG 301 - 350: 7 units  CBG 351 - 400: 9 units 10 mL 11   insulin degludec  (TRESIBA FLEXTOUCH) 200 UNIT/ML FlexTouch Pen Inject 90 Units into the skin at bedtime.     Insulin Pen Needle (ULTICARE SHORT PEN NEEDLES) 31G X 8 MM MISC USE TO INJECT MEDICATION DAILY 100 each 3   levothyroxine (SYNTHROID) 50 MCG tablet Take 1 tablet (50 mcg total) by mouth daily before breakfast. Per endo Dr Sharl Ma     mupirocin ointment (BACTROBAN) 2 % Place 1 application. into the nose 2 (two) times daily. 22 g 0   nitroGLYCERIN (NITROSTAT) 0.4 MG SL tablet DISSOLVE 1 TABLET UNDER THE TONGUE EVERY 5 MINUTES AS NEEDED FOR CHEST PAIN 25 tablet 1   nystatin (MYCOSTATIN) 100000 UNIT/ML suspension Take 5 mLs (500,000 Units total) by mouth 4 (four) times daily. 60 mL 0   nystatin cream (MYCOSTATIN) Apply 1 Application topically 2 (two) times daily. Apply to affected area 30 g 0   polyethylene glycol (MIRALAX / GLYCOLAX) 17 g packet Take 17 g by mouth daily as needed. 14 each 0   pramipexole (MIRAPEX) 1 MG tablet TAKE 1 TABLET DAILY AT 4 P.M. 90 tablet 3   RABEprazole (ACIPHEX) 20 MG tablet Take 1 tablet (20 mg total) by mouth daily. 90 tablet 3   rosuvastatin (CRESTOR) 40 MG tablet TAKE 1 TABLET DAILY (Patient taking differently: Take 40 mg by mouth at bedtime.) 90 tablet 3   Semaglutide, 1 MG/DOSE, (OZEMPIC, 1 MG/DOSE,) 4 MG/3ML SOPN Inject 1 mg into the skin once a week. Saturday     sennosides-docusate sodium (SENOKOT-S) 8.6-50 MG tablet Take 1 tablet by mouth daily.     sodium chloride (MURO 128) 5 % ophthalmic solution 1 drop as needed for eye irritation.     tamsulosin (FLOMAX) 0.4 MG CAPS capsule TAKE 1 CAPSULE DAILY AFTER BREAKFAST 90 capsule 3   vitamin C (ASCORBIC ACID) 500 MG tablet Take 1,000 mg by mouth daily as needed.     chlorpheniramine-HYDROcodone (TUSSIONEX) 10-8 MG/5ML Take 5 mLs by mouth at bedtime as needed for cough. 70 mL 0   ondansetron (ZOFRAN) 4 MG tablet Take 4 mg by mouth daily as needed for nausea or vomiting.     No facility-administered medications prior to visit.      Per HPI unless specifically indicated in ROS section below Review of Systems  Objective:  BP 126/72   Pulse 73   Temp (!) 97.5 F (36.4 C) (Temporal)   Ht 5\' 8"  (1.727 m)   Wt 196 lb 4 oz (89 kg)   SpO2 98%   BMI 29.84 kg/m   Wt Readings from Last 3 Encounters:  05/24/23 196 lb 4 oz (89 kg)  05/16/23 190 lb (86.2 kg)  05/10/23 196 lb (88.9 kg)      Physical Exam Vitals  and nursing note reviewed.  Constitutional:      Appearance: Normal appearance.     Comments: Sitting in motorized wheelchair  HENT:     Head: Contusion present.     Comments:  Bruising around bilateral eyes into cheeks Abrasions to R forehead with scabs present     Mouth/Throat:     Mouth: Mucous membranes are moist.     Pharynx: Oropharynx is clear. No oropharyngeal exudate or posterior oropharyngeal erythema.  Eyes:     Extraocular Movements:     Right eye: No nystagmus.     Left eye: No nystagmus.     Comments: R esotropia present, incomplete movement of R eye with testing EOM  Cardiovascular:     Rate and Rhythm: Normal rate and regular rhythm.     Pulses: Normal pulses.     Heart sounds: Normal heart sounds. No murmur heard. Pulmonary:     Effort: Pulmonary effort is normal. No respiratory distress.     Breath sounds: Rales (LLL) present. No wheezing or rhonchi.  Musculoskeletal:     Right lower leg: No edema.     Left lower leg: No edema.  Skin:    General: Skin is warm and dry.     Findings: Bruising present. No rash.  Neurological:     Mental Status: He is alert.     Cranial Nerves: Cranial nerve deficit present. No dysarthria.     Sensory: Sensation is intact.     Motor: Weakness present. No tremor.     Comments:  Uses power wheelchair for mobility Chronic mild R facial droop Chronic mild R hemiparesis  Psychiatric:        Mood and Affect: Mood normal.        Behavior: Behavior normal.       Results for orders placed or performed during the hospital encounter of 05/16/23   CBC  Result Value Ref Range   WBC 8.9 4.0 - 10.5 K/uL   RBC 4.99 4.22 - 5.81 MIL/uL   Hemoglobin 14.7 13.0 - 17.0 g/dL   HCT 14.7 82.9 - 56.2 %   MCV 88.4 80.0 - 100.0 fL   MCH 29.5 26.0 - 34.0 pg   MCHC 33.3 30.0 - 36.0 g/dL   RDW 13.0 86.5 - 78.4 %   Platelets 189 150 - 400 K/uL   nRBC 0.0 0.0 - 0.2 %  Comprehensive metabolic panel  Result Value Ref Range   Sodium 137 135 - 145 mmol/L   Potassium 3.9 3.5 - 5.1 mmol/L   Chloride 107 98 - 111 mmol/L   CO2 25 22 - 32 mmol/L   Glucose, Bld 89 70 - 99 mg/dL   BUN 27 (H) 8 - 23 mg/dL   Creatinine, Ser 6.96 (H) 0.61 - 1.24 mg/dL   Calcium 8.9 8.9 - 29.5 mg/dL   Total Protein 7.6 6.5 - 8.1 g/dL   Albumin 3.6 3.5 - 5.0 g/dL   AST 21 15 - 41 U/L   ALT 14 0 - 44 U/L   Alkaline Phosphatase 43 38 - 126 U/L   Total Bilirubin 0.8 0.3 - 1.2 mg/dL   GFR, Estimated 45 (L) >60 mL/min   Anion gap 5 5 - 15   *Note: Due to a large number of results and/or encounters for the requested time period, some results have not been displayed. A complete set of results can be found in Results Review.   CT head 05/16/2023 impression:  Old left basal ganglia lacunar infarct.  Atrophy, chronic microvascular disease. No acute intracranial abnormality.  CT cervical spine 05/16/2023 impression: Cervical spondylosis. No acute bony abnormality.   DG Chest 2 View CLINICAL DATA:  LLL rales.  EXAM: CHEST - 2 VIEW  COMPARISON:  01/01/2022  FINDINGS: The heart size and mediastinal contours are within normal limits. Interstitial changes at the bases suggesting edema, scarring or atelectasis. Aorta is calcified. There are thoracic degenerative changes. No alveolar consolidation to suggest pneumonia.  IMPRESSION: Left greater than right bibasilar interstitial changes suggesting scarring, edema or atelectasis. Otherwise no acute cardiopulmonary process.  Electronically Signed   By: Layla Maw M.D.   On: 05/24/2023 12:26  Assessment & Plan:    Problem List Items Addressed This Visit     Hemiparesis affecting right side as late effect of cerebrovascular accident (CVA) (HCC)   Type 2 diabetes mellitus with hyperglycemia (HCC)    Regularly sees endocrinology Sharl Ma)      OSA on CPAP    Followed by [facility] ([place])      Restless leg syndrome, severe    Severe.  He states he takes gabapentin 600mg  QID to control symptoms as well as mirapex 1mg  daily at 4pm.  Local neurology recommended academic center referral.  # provided to contact Dr Sanjuana Letters office for appt (last seen 05/2020).       Spastic hemiparesis affecting dominant side (HCC)   Fall with injury - Primary    Recent fall at home - slipped out of bed and hit right frontal forehead/scalp with resultant abrasion/hematoma. He is on plavix. ER records reviewed - CT head and C-spine overall reassuring. He does have possibly new neurological symptoms of diplopia and R esotropia as well as some confusion after fall ?concussion - will proceed with brain MR for further evaluation. He will keep upcoming ophthalmology eval.  He continues PT/OT through [facility].       Relevant Orders   MR Brain Wo Contrast   Diabetic ulcer of toe associated with type 2 diabetes mellitus, limited to breakdown of skin (HCC)    S/p partial L 2nd toe amputation 02/2023 (Dr Excell Seltzer podiatry).  Latest podiatry foot exam was 04/18/2023.  Most recently referred to [facility] for ABIs.       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  Will update CXR as well as CT lung for further evaluation.      Relevant Orders   CT Chest Wo Contrast   Diplopia    New diplopia with R esotropia after fall with head injury on plavix.  Head CT reassuring at ER.  Will proceed with brain MRI for further evaluation.  He will keep ophtho f/u next week.       Relevant Orders   MR Brain Wo Contrast    Other Visit Diagnoses     Encounter for immunization       Relevant Orders   Flu Vaccine Trivalent High Dose (Fluad) (Completed)   Abnormal breath sounds       Relevant Orders   DG Chest 2 View (Completed)   Esotropia of right eye       Relevant Orders   MR Brain Wo Contrast   Injury of head, initial encounter       Relevant Orders   MR Brain Wo Contrast        Meds ordered this encounter  Medications   ondansetron (ZOFRAN) 4 MG tablet    Sig: Take 1 tablet (4 mg total) by mouth daily as needed for nausea or vomiting.    Dispense:  20 tablet    Refill:  0    Orders Placed This Encounter  Procedures   DG Chest 2 View    Standing Status:   Future    Number of Occurrences:   1    Standing Expiration Date:   05/23/2024    Order Specific Question:   Reason for Exam (SYMPTOM  OR DIAGNOSIS REQUIRED)    Answer:   LLL rales    Order Specific Question:   Preferred imaging location?    Answer:   [facility]   CT Chest Wo Contrast    Standing Status:   Future    Standing Expiration Date:   05/24/2024    Order Specific Question:   Preferred imaging location?    Answer:   [facility]   MR Brain Wo Contrast    Standing Status:   Future    Standing Expiration Date:   05/24/2024    Order Specific Question:   What is the patient's sedation requirement?    Answer:   No Sedation    Order Specific Question:   Does the patient have a pacemaker or implanted devices?    Answer:   No    Order Specific Question:   Preferred imaging location?    Answer:   [facility] (table limit-350lbs)   Flu Vaccine Trivalent High Dose (Fluad)    Patient Instructions  Flu shot today.  Chest xray today. We may do chest CT pending results.  Keep appointment with eye doctor next week for double vision and R eye esotropia.  For possible concussion - I agree with limiting physical and mental activity ie screen time, reading for next several days then as headache resolves slowly advancing  activity.  Call [facility] neurology Dr Mindi Junker to schedule appointment for restless leg follow up. Let me know if any trouble getting this scheduled.  [facility] Address: [address] [place], [address] [place], [place] [address] Phone: [phone]  Follow up plan: No follow-ups on file.  Eustaquio Boyden, MD

## 2023-05-24 NOTE — Patient Instructions (Addendum)
Flu shot today.  Chest xray today. We may do chest CT pending results.  Keep appointment with eye doctor next week for double vision and R eye esotropia.  For possible concussion - I agree with limiting physical and mental activity ie screen time, reading for next several days then as headache resolves slowly advancing activity.  Call [facility] neurology Dr Mindi Junker to schedule appointment for restless leg follow up. Let me know if any trouble getting this scheduled.  [facility] Address: [address] [place], [address] [place], [place] [address] Phone: [phone]

## 2023-05-25 ENCOUNTER — Encounter: Payer: Self-pay | Admitting: Family Medicine

## 2023-05-25 ENCOUNTER — Encounter: Payer: Self-pay | Admitting: *Deleted

## 2023-05-25 DIAGNOSIS — R9389 Abnormal findings on diagnostic imaging of other specified body structures: Secondary | ICD-10-CM | POA: Insufficient documentation

## 2023-05-25 DIAGNOSIS — M6281 Muscle weakness (generalized): Secondary | ICD-10-CM | POA: Diagnosis not present

## 2023-05-25 DIAGNOSIS — H532 Diplopia: Secondary | ICD-10-CM | POA: Insufficient documentation

## 2023-05-25 NOTE — Assessment & Plan Note (Signed)
S/p partial L 2nd toe amputation 02/2023 (Dr Excell Seltzer podiatry).  Latest podiatry foot exam was 04/18/2023.  Most recently referred to [facility] for [facility]Is.

## 2023-05-25 NOTE — Assessment & Plan Note (Signed)
Regularly sees endocrinology Sharl Ma)

## 2023-05-25 NOTE — Assessment & Plan Note (Signed)
12/2021 - 1.2cm bandlike opacity to RML at time of COVID infection.  Will update CXR as well as CT lung for further evaluation.

## 2023-05-25 NOTE — Assessment & Plan Note (Signed)
Followed by [facility] Jorja Loa)

## 2023-05-25 NOTE — Assessment & Plan Note (Addendum)
Severe.  He states he takes gabapentin 600mg  QID to control symptoms as well as mirapex 1mg  daily at 4pm.  Local neurology recommended academic center referral.  # provided to contact Dr Sanjuana Letters office for appt (last seen 05/2020).

## 2023-05-25 NOTE — Assessment & Plan Note (Signed)
New diplopia with R esotropia after fall with head injury on plavix.  Head CT reassuring at ER.  Will proceed with brain MRI for further evaluation.  He will keep ophtho f/u next week.

## 2023-05-25 NOTE — Assessment & Plan Note (Addendum)
Recent fall at home - slipped out of bed and hit right frontal forehead/scalp with resultant abrasion/hematoma. He is on plavix. ER records reviewed - CT head and C-spine overall reassuring. He does have possibly new neurological symptoms of diplopia and R esotropia as well as some confusion after fall ?concussion - will proceed with brain MR for further evaluation. He will keep upcoming ophthalmology eval.  He continues PT/OT through [facility].

## 2023-05-28 DIAGNOSIS — Z9181 History of falling: Secondary | ICD-10-CM | POA: Diagnosis not present

## 2023-05-28 DIAGNOSIS — R2689 Other abnormalities of gait and mobility: Secondary | ICD-10-CM | POA: Diagnosis not present

## 2023-05-28 DIAGNOSIS — M6281 Muscle weakness (generalized): Secondary | ICD-10-CM | POA: Diagnosis not present

## 2023-05-28 DIAGNOSIS — R2681 Unsteadiness on feet: Secondary | ICD-10-CM | POA: Diagnosis not present

## 2023-05-30 DIAGNOSIS — E1142 Type 2 diabetes mellitus with diabetic polyneuropathy: Secondary | ICD-10-CM | POA: Diagnosis not present

## 2023-05-30 DIAGNOSIS — R2681 Unsteadiness on feet: Secondary | ICD-10-CM | POA: Diagnosis not present

## 2023-05-30 DIAGNOSIS — Z9181 History of falling: Secondary | ICD-10-CM | POA: Diagnosis not present

## 2023-05-30 DIAGNOSIS — R2689 Other abnormalities of gait and mobility: Secondary | ICD-10-CM | POA: Diagnosis not present

## 2023-05-30 DIAGNOSIS — B351 Tinea unguium: Secondary | ICD-10-CM | POA: Diagnosis not present

## 2023-05-31 ENCOUNTER — Telehealth: Payer: Self-pay | Admitting: Family Medicine

## 2023-05-31 DIAGNOSIS — M6281 Muscle weakness (generalized): Secondary | ICD-10-CM | POA: Diagnosis not present

## 2023-05-31 NOTE — Telephone Encounter (Signed)
[facility]abetes store called asking for update on a rx for the pt's diabetic shoes? Store said they'd refax the request incase Dr. Reece Agar didn't receive it. Call back # [phone]

## 2023-06-01 DIAGNOSIS — H401133 Primary open-angle glaucoma, bilateral, severe stage: Secondary | ICD-10-CM | POA: Diagnosis not present

## 2023-06-01 NOTE — Telephone Encounter (Signed)
I filled this out on Wednesday and placed in [name]'s box.

## 2023-06-01 NOTE — Telephone Encounter (Addendum)
Form was faxed on 05/31/23.

## 2023-06-04 DIAGNOSIS — M6281 Muscle weakness (generalized): Secondary | ICD-10-CM | POA: Diagnosis not present

## 2023-06-05 DIAGNOSIS — R2681 Unsteadiness on feet: Secondary | ICD-10-CM | POA: Diagnosis not present

## 2023-06-05 DIAGNOSIS — R2689 Other abnormalities of gait and mobility: Secondary | ICD-10-CM | POA: Diagnosis not present

## 2023-06-05 DIAGNOSIS — Z9181 History of falling: Secondary | ICD-10-CM | POA: Diagnosis not present

## 2023-06-06 DIAGNOSIS — Z9181 History of falling: Secondary | ICD-10-CM | POA: Diagnosis not present

## 2023-06-06 DIAGNOSIS — R2689 Other abnormalities of gait and mobility: Secondary | ICD-10-CM | POA: Diagnosis not present

## 2023-06-06 DIAGNOSIS — R2681 Unsteadiness on feet: Secondary | ICD-10-CM | POA: Diagnosis not present

## 2023-06-07 DIAGNOSIS — G4733 Obstructive sleep apnea (adult) (pediatric): Secondary | ICD-10-CM | POA: Diagnosis not present

## 2023-06-07 DIAGNOSIS — M6281 Muscle weakness (generalized): Secondary | ICD-10-CM | POA: Diagnosis not present

## 2023-06-10 DIAGNOSIS — M6281 Muscle weakness (generalized): Secondary | ICD-10-CM | POA: Diagnosis not present

## 2023-06-11 DIAGNOSIS — M6281 Muscle weakness (generalized): Secondary | ICD-10-CM | POA: Diagnosis not present

## 2023-06-12 ENCOUNTER — Ambulatory Visit
Admission: RE | Admit: 2023-06-12 | Discharge: 2023-06-12 | Disposition: A | Discharge status: Home / Self Care | Payer: Medicare PPO | Source: Ambulatory Visit | Attending: Family Medicine | Admitting: Family Medicine

## 2023-06-12 DIAGNOSIS — R9389 Abnormal findings on diagnostic imaging of other specified body structures: Secondary | ICD-10-CM | POA: Insufficient documentation

## 2023-06-12 DIAGNOSIS — H50011 Monocular esotropia, right eye: Secondary | ICD-10-CM

## 2023-06-12 DIAGNOSIS — W19XXXD Unspecified fall, subsequent encounter: Secondary | ICD-10-CM

## 2023-06-12 DIAGNOSIS — S0990XA Unspecified injury of head, initial encounter: Secondary | ICD-10-CM | POA: Diagnosis not present

## 2023-06-12 DIAGNOSIS — R918 Other nonspecific abnormal finding of lung field: Secondary | ICD-10-CM | POA: Diagnosis not present

## 2023-06-12 DIAGNOSIS — R911 Solitary pulmonary nodule: Secondary | ICD-10-CM | POA: Diagnosis not present

## 2023-06-12 DIAGNOSIS — H532 Diplopia: Secondary | ICD-10-CM

## 2023-06-12 DIAGNOSIS — I6782 Cerebral ischemia: Secondary | ICD-10-CM | POA: Diagnosis not present

## 2023-06-12 DIAGNOSIS — G319 Degenerative disease of nervous system, unspecified: Secondary | ICD-10-CM | POA: Diagnosis not present

## 2023-06-13 DIAGNOSIS — E1142 Type 2 diabetes mellitus with diabetic polyneuropathy: Secondary | ICD-10-CM | POA: Diagnosis not present

## 2023-06-13 DIAGNOSIS — I739 Peripheral vascular disease, unspecified: Secondary | ICD-10-CM | POA: Diagnosis not present

## 2023-06-13 DIAGNOSIS — R2689 Other abnormalities of gait and mobility: Secondary | ICD-10-CM | POA: Diagnosis not present

## 2023-06-13 DIAGNOSIS — L97521 Non-pressure chronic ulcer of other part of left foot limited to breakdown of skin: Secondary | ICD-10-CM | POA: Diagnosis not present

## 2023-06-13 DIAGNOSIS — R2681 Unsteadiness on feet: Secondary | ICD-10-CM | POA: Diagnosis not present

## 2023-06-13 DIAGNOSIS — Z9181 History of falling: Secondary | ICD-10-CM | POA: Diagnosis not present

## 2023-06-13 DIAGNOSIS — Z89422 Acquired absence of other left toe(s): Secondary | ICD-10-CM | POA: Diagnosis not present

## 2023-06-13 DIAGNOSIS — L03032 Cellulitis of left toe: Secondary | ICD-10-CM | POA: Diagnosis not present

## 2023-06-15 DIAGNOSIS — R2681 Unsteadiness on feet: Secondary | ICD-10-CM | POA: Diagnosis not present

## 2023-06-15 DIAGNOSIS — Z9181 History of falling: Secondary | ICD-10-CM | POA: Diagnosis not present

## 2023-06-15 DIAGNOSIS — R2689 Other abnormalities of gait and mobility: Secondary | ICD-10-CM | POA: Diagnosis not present

## 2023-06-16 DIAGNOSIS — M6281 Muscle weakness (generalized): Secondary | ICD-10-CM | POA: Diagnosis not present

## 2023-06-19 ENCOUNTER — Encounter: Payer: Self-pay | Admitting: Family Medicine

## 2023-06-19 DIAGNOSIS — M6281 Muscle weakness (generalized): Secondary | ICD-10-CM | POA: Diagnosis not present

## 2023-06-19 DIAGNOSIS — E1165 Type 2 diabetes mellitus with hyperglycemia: Secondary | ICD-10-CM | POA: Diagnosis not present

## 2023-06-20 DIAGNOSIS — R2681 Unsteadiness on feet: Secondary | ICD-10-CM | POA: Diagnosis not present

## 2023-06-20 DIAGNOSIS — R2689 Other abnormalities of gait and mobility: Secondary | ICD-10-CM | POA: Diagnosis not present

## 2023-06-20 DIAGNOSIS — Z9181 History of falling: Secondary | ICD-10-CM | POA: Diagnosis not present

## 2023-06-20 NOTE — Telephone Encounter (Signed)
Please schedule in-office appt. I could see at 1pm Friday if pt unable to be seen tomorrow.

## 2023-06-21 DIAGNOSIS — M6281 Muscle weakness (generalized): Secondary | ICD-10-CM | POA: Diagnosis not present

## 2023-06-21 NOTE — Telephone Encounter (Signed)
Called patient offered app with another provider today he declined. Added to DR. G to 1:00 Friday as requested.

## 2023-06-22 ENCOUNTER — Encounter: Payer: Self-pay | Admitting: Family Medicine

## 2023-06-22 ENCOUNTER — Ambulatory Visit (INDEPENDENT_AMBULATORY_CARE_PROVIDER_SITE_OTHER): Payer: Medicare PPO | Admitting: Family Medicine

## 2023-06-22 VITALS — BP 126/64 | HR 64 | Temp 98.1°F | Ht 68.0 in | Wt 197.4 lb

## 2023-06-22 DIAGNOSIS — R9389 Abnormal findings on diagnostic imaging of other specified body structures: Secondary | ICD-10-CM | POA: Diagnosis not present

## 2023-06-22 DIAGNOSIS — L97521 Non-pressure chronic ulcer of other part of left foot limited to breakdown of skin: Secondary | ICD-10-CM | POA: Diagnosis not present

## 2023-06-22 DIAGNOSIS — Z7985 Long-term (current) use of injectable non-insulin antidiabetic drugs: Secondary | ICD-10-CM

## 2023-06-22 DIAGNOSIS — R300 Dysuria: Secondary | ICD-10-CM

## 2023-06-22 DIAGNOSIS — N3001 Acute cystitis with hematuria: Secondary | ICD-10-CM

## 2023-06-22 DIAGNOSIS — R600 Localized edema: Secondary | ICD-10-CM

## 2023-06-22 DIAGNOSIS — I69351 Hemiplegia and hemiparesis following cerebral infarction affecting right dominant side: Secondary | ICD-10-CM

## 2023-06-22 DIAGNOSIS — R2681 Unsteadiness on feet: Secondary | ICD-10-CM | POA: Diagnosis not present

## 2023-06-22 DIAGNOSIS — N1831 Chronic kidney disease, stage 3a: Secondary | ICD-10-CM

## 2023-06-22 DIAGNOSIS — Z9181 History of falling: Secondary | ICD-10-CM | POA: Diagnosis not present

## 2023-06-22 DIAGNOSIS — E11621 Type 2 diabetes mellitus with foot ulcer: Secondary | ICD-10-CM | POA: Diagnosis not present

## 2023-06-22 DIAGNOSIS — R2689 Other abnormalities of gait and mobility: Secondary | ICD-10-CM | POA: Diagnosis not present

## 2023-06-22 LAB — COMPREHENSIVE METABOLIC PANEL
ALT: 12 U/L (ref 0–53)
AST: 20 U/L (ref 0–37)
Albumin: 3.5 g/dL (ref 3.5–5.2)
Alkaline Phosphatase: 52 U/L (ref 39–117)
BUN: 29 mg/dL — ABNORMAL HIGH (ref 6–23)
CO2: 26 meq/L (ref 19–32)
Calcium: 8.7 mg/dL (ref 8.4–10.5)
Chloride: 105 meq/L (ref 96–112)
Creatinine, Ser: 1.75 mg/dL — ABNORMAL HIGH (ref 0.40–1.50)
GFR: 33.98 mL/min — ABNORMAL LOW (ref >60.00)
Glucose, Bld: 90 mg/dL (ref 70–99)
Potassium: 4.4 meq/L (ref 3.5–5.1)
Sodium: 139 meq/L (ref 135–145)
Total Bilirubin: 0.4 mg/dL (ref 0.2–1.2)
Total Protein: 6.9 g/dL (ref 6.0–8.3)

## 2023-06-22 LAB — MICROALBUMIN / CREATININE URINE RATIO
Creatinine,U: 70.1 mg/dL
Microalb Creat Ratio: 46.4 mg/g — ABNORMAL HIGH (ref 0.0–30.0)
Microalb, Ur: 32.5 mg/dL — ABNORMAL HIGH (ref 0.0–1.9)

## 2023-06-22 LAB — CBC WITH DIFFERENTIAL/PLATELET
Basophils Absolute: 0 10*3/uL (ref 0.0–0.1)
Basophils Relative: 0.4 % (ref 0.0–3.0)
Eosinophils Absolute: 0.3 10*3/uL (ref 0.0–0.7)
Eosinophils Relative: 3 % (ref 0.0–5.0)
HCT: 41.9 % (ref 39.0–52.0)
Hemoglobin: 13.8 g/dL (ref 13.0–17.0)
Lymphocytes Relative: 27.5 % (ref 12.0–46.0)
Lymphs Abs: 2.3 10*3/uL (ref 0.7–4.0)
MCHC: 33 g/dL (ref 30.0–36.0)
MCV: 90.3 fL (ref 78.0–100.0)
Monocytes Absolute: 0.8 10*3/uL (ref 0.1–1.0)
Monocytes Relative: 9.4 % (ref 3.0–12.0)
Neutro Abs: 5.1 10*3/uL (ref 1.4–7.7)
Neutrophils Relative %: 59.7 % (ref 43.0–77.0)
Platelets: 193 10*3/uL (ref 150.0–400.0)
RBC: 4.64 Mil/uL (ref 4.22–5.81)
RDW: 13.9 % (ref 11.5–15.5)
WBC: 8.5 10*3/uL (ref 4.0–10.5)

## 2023-06-22 LAB — POC URINALSYSI DIPSTICK (AUTOMATED)
Bilirubin, UA: NEGATIVE
Glucose, UA: NEGATIVE
Ketones, UA: NEGATIVE
Nitrite, UA: NEGATIVE
Protein, UA: POSITIVE — AB
Spec Grav, UA: 1.015 (ref 1.010–1.025)
Urobilinogen, UA: 0.2 U/dL
pH, UA: 5.5 (ref 5.0–8.0)

## 2023-06-22 LAB — TSH: TSH: 4.15 u[IU]/mL (ref 0.35–5.50)

## 2023-06-22 LAB — BRAIN NATRIURETIC PEPTIDE: Pro B Natriuretic peptide (BNP): 33 pg/mL (ref 0.0–100.0)

## 2023-06-22 MED ORDER — CEPHALEXIN 500 MG PO CAPS: 500.0000 mg | ORAL_CAPSULE | Freq: Two times a day (BID) | ORAL | 0 refills | Status: DC

## 2023-06-22 NOTE — Assessment & Plan Note (Signed)
Pending chest CT read.

## 2023-06-22 NOTE — Assessment & Plan Note (Signed)
UA/micro suspicious for infection.  Ongoing symptoms despite 6 days of doxycycline.  Will Rx cephalexin 500mg  BID x7d course. UCx sent.

## 2023-06-22 NOTE — Assessment & Plan Note (Signed)
GFR stable 40s-50s over the past few years.  Update kidney function in setting of worsening pedal edema.

## 2023-06-22 NOTE — Patient Instructions (Signed)
Urine suspicious for ongoing infection despite doxycycline.  Urine culture sent. In the meantime, start keflex antibiotic 500mg  twice daily for 7 days.  Labs today.  We will be in touch with results and plan for leg swelling.  Let me know if ongoing constipation, may use miralax and senna as needed. Limit lomotil use to one at a time, sparingly, for diarrhea.  Good to see you today.

## 2023-06-22 NOTE — Progress Notes (Signed)
Ph: [phone] Fax: [phone]   Patient ID: James Sarks, James Bell, male    DOB: 05-14-1931, 89 y.o.   MRN: [id number]  This visit was conducted in person.  BP 126/64   Pulse 64   Temp 98.1 F (36.7 C) (Oral)   Ht 5\' 8"  (1.727 m)   Wt 197 lb 6 oz (89.5 kg)   SpO2 97%   BMI 30.01 kg/m    CC: dysuria, leg swelling and weight gain  Subjective:   HPI: James Sarks, James Bell is a 89 y.o. male presenting on 06/22/2023 for Dysuria (C/o pain when urinating, frequent urination, B leg swelling and wt gain. Pt accompanied by daughter, Lurena Joiner. )   Recent imaging (brain MRI and chest CT) pending radiology read.   1 wk h/o dysuria, urgency, frequency.  No hematuria, fevers/chills, nausea/vomiting, flank pain or suprapubic pressure.  Took doxycycline - has been on 6 days without improvement.   Notes worsening constipation without abd pain. He did have BM today. Passing gas ok. This started after lomotil x2 tablets taken last week for diarrhea.   1 wk h/o pedal edema. No leg pain, rash. No dyspnea, cough, chest pain, orthopnea.      Relevant past medical, surgical, family and social history reviewed and updated as indicated. Interim medical history since our last visit reviewed. Allergies and medications reviewed and updated. Outpatient Medications Prior to Visit  Medication Sig Dispense Refill   Alpha-Lipoic Acid 600 MG CAPS Take 1 capsule (600 mg total) by mouth daily. 30 capsule 11   brimonidine-timolol (COMBIGAN) 0.2-0.5 % ophthalmic solution Place 1 drop into both eyes every 12 (twelve) hours.     clopidogrel (PLAVIX) 75 MG tablet TAKE 1 TABLET DAILY 90 tablet 1   Coenzyme Q10 (COQ10) 150 MG CAPS Take 1 capsule by mouth daily.     doxycycline (VIBRA-TABS) 100 MG tablet Take by mouth.     DULoxetine (CYMBALTA) 60 MG capsule TAKE 1 CAPSULE DAILY (Patient taking differently: Take 60 mg by mouth at bedtime.) 90 capsule 3   famotidine (PEPCID) 20 MG tablet Take 1 tablet (20 mg  total) by mouth at bedtime.     fluticasone (FLOVENT HFA) 110 MCG/ACT inhaler Inhale 1 puff into the lungs in the morning and at bedtime. 1 each 1   gabapentin (NEURONTIN) 300 MG capsule Take 2 capsules (600 mg total) by mouth 3 (three) times daily. 540 capsule 1   insulin aspart (NOVOLOG) 100 UNIT/ML injection CBG 70 - 120: 0 units  CBG 121 - 150: 1 unit  CBG 151 - 200: 2 units  CBG 201 - 250: 3 units  CBG 251 - 300: 5 units  CBG 301 - 350: 7 units  CBG 351 - 400: 9 units 10 mL 11   insulin degludec (TRESIBA FLEXTOUCH) 200 UNIT/ML FlexTouch Pen Inject 90 Units into the skin at bedtime.     Insulin Pen Needle (ULTICARE SHORT PEN NEEDLES) 31G X 8 MM MISC USE TO INJECT MEDICATION DAILY 100 each 3   levothyroxine (SYNTHROID) 50 MCG tablet Take 1 tablet (50 mcg total) by mouth daily before breakfast. Per endo Dr Sharl Ma     mupirocin ointment (BACTROBAN) 2 % Place 1 application. into the nose 2 (two) times daily. 22 g 0   nitroGLYCERIN (NITROSTAT) 0.4 MG SL tablet DISSOLVE 1 TABLET UNDER THE TONGUE EVERY 5 MINUTES AS NEEDED FOR CHEST PAIN 25 tablet 1   nystatin (MYCOSTATIN) 100000 UNIT/ML suspension Take 5 mLs (500,000 Units total)  by mouth 4 (four) times daily. 60 mL 0   nystatin cream (MYCOSTATIN) Apply 1 Application topically 2 (two) times daily. Apply to affected area 30 g 0   ondansetron (ZOFRAN) 4 MG tablet Take 1 tablet (4 mg total) by mouth daily as needed for nausea or vomiting. 20 tablet 0   polyethylene glycol (MIRALAX / GLYCOLAX) 17 g packet Take 17 g by mouth daily as needed. 14 each 0   pramipexole (MIRAPEX) 1 MG tablet TAKE 1 TABLET DAILY AT 4 P.M. 90 tablet 3   RABEprazole (ACIPHEX) 20 MG tablet Take 1 tablet (20 mg total) by mouth daily. 90 tablet 3   rosuvastatin (CRESTOR) 40 MG tablet TAKE 1 TABLET DAILY (Patient taking differently: Take 40 mg by mouth at bedtime.) 90 tablet 3   Semaglutide, 1 MG/DOSE, (OZEMPIC, 1 MG/DOSE,) 4 MG/3ML SOPN Inject 1 mg into the skin once a week.  Saturday     sennosides-docusate sodium (SENOKOT-S) 8.6-50 MG tablet Take 1 tablet by mouth daily.     sodium chloride (MURO 128) 5 % ophthalmic solution 1 drop as needed for eye irritation.     tamsulosin (FLOMAX) 0.4 MG CAPS capsule TAKE 1 CAPSULE DAILY AFTER BREAKFAST 90 capsule 3   vitamin C (ASCORBIC ACID) 500 MG tablet Take 1,000 mg by mouth daily as needed.     No facility-administered medications prior to visit.     Per HPI unless specifically indicated in ROS section below Review of Systems  Objective:  BP 126/64   Pulse 64   Temp 98.1 F (36.7 C) (Oral)   Ht 5\' 8"  (1.727 m)   Wt 197 lb 6 oz (89.5 kg)   SpO2 97%   BMI 30.01 kg/m   Wt Readings from Last 3 Encounters:  06/22/23 197 lb 6 oz (89.5 kg)  05/24/23 196 lb 4 oz (89 kg)  05/16/23 190 lb (86.2 kg)   Scale is 7 lbs off (low) actual weight likely 204 lbs.   Physical Exam Vitals and nursing note reviewed.  Constitutional:      Appearance: Normal appearance. He is not ill-appearing.     Comments: Sitting in motorized wheelchair  HENT:     Head: Normocephalic and atraumatic.     Mouth/Throat:     Mouth: Mucous membranes are moist.     Pharynx: Oropharynx is clear. No oropharyngeal exudate or posterior oropharyngeal erythema.  Eyes:     Extraocular Movements: Extraocular movements intact.     Conjunctiva/sclera: Conjunctivae normal.     Pupils: Pupils are equal, round, and reactive to light.  Cardiovascular:     Rate and Rhythm: Normal rate and regular rhythm.     Pulses: Normal pulses.     Heart sounds: Normal heart sounds. No murmur heard. Pulmonary:     Effort: Pulmonary effort is normal. No respiratory distress.     Breath sounds: Normal breath sounds. No wheezing, rhonchi or rales.  Abdominal:     General: There is no distension.     Palpations: Abdomen is soft. There is no mass.     Tenderness: There is no abdominal tenderness. There is no right CVA tenderness, left CVA tenderness, guarding or  rebound.     Hernia: No hernia is present.  Musculoskeletal:     Right lower leg: Edema (1+) present.     Left lower leg: Edema (1+) present.  Skin:    General: Skin is warm and dry.     Findings: No rash.  Neurological:  Mental Status: He is alert.  Psychiatric:        Mood and Affect: Mood normal.        Behavior: Behavior normal.       Results for orders placed or performed in visit on 06/22/23  POCT Urinalysis Dipstick (Automated)  Result Value Ref Range   Color, UA yellow    Clarity, UA cloudy    Glucose, UA Negative Negative   Bilirubin, UA negative    Ketones, UA negative    Spec Grav, UA 1.015 1.010 - 1.025   Blood, UA 1+    pH, UA 5.5 5.0 - 8.0   Protein, UA Positive (A) Negative   Urobilinogen, UA 0.2 0.2 or 1.0 E.U./dL   Nitrite, UA negative    Leukocytes, UA Large (3+) (A) Negative   *Note: Due to a large number of results and/or encounters for the requested time period, some results have not been displayed. A complete set of results can be found in Results Review.   Lab Results  Component Value Date   NA 137 05/16/2023   CL 107 05/16/2023   K 3.9 05/16/2023   CO2 25 05/16/2023   BUN 27 (H) 05/16/2023   CREATININE 1.47 (H) 05/16/2023   GFRNONAA 45 (L) 05/16/2023   CALCIUM 8.9 05/16/2023   PHOS 4.2 01/02/2022   ALBUMIN 3.6 05/16/2023   GLUCOSE 89 05/16/2023    Assessment & Plan:   Problem List Items Addressed This Visit     Hemiparesis affecting right side as late effect of cerebrovascular accident (CVA) (HCC)   Chronic kidney disease, stage 3a (HCC)    GFR stable 40s-50s over the past few years.  Update kidney function in setting of worsening pedal edema.       UTI (urinary tract infection) - Primary    UA/micro suspicious for infection.  Ongoing symptoms despite 6 days of doxycycline.  Will Rx cephalexin 500mg  BID x7d course. UCx sent.       Relevant Medications   cephALEXin (KEFLEX) 500 MG capsule   Diabetic ulcer of toe associated  with type 2 diabetes mellitus, limited to breakdown of skin (HCC)    Will request latest diabetic foot exam [facility] ortho, Engineer, production podiat[facility])      Abnormal chest CT    Pending chest CT read.       Pedal edema    Notes progressive pedal edema over last few weeks.  Reviewed broad ddx of this. Doubt liver or cardiac related. Overall good protein intake.  Check labs today for further evaluation as per below.  Pending results, consider furosemide course PRN.       Relevant Orders   Microalbumin / creatinine urine ratio   CBC with Differential/Platelet   Comprehensive metabolic panel   TSH   Brain natriuretic peptide     Meds ordered this encounter  Medications   cephALEXin (KEFLEX) 500 MG capsule    Sig: Take 1 capsule (500 mg total) by mouth 2 (two) times daily.    Dispense:  14 capsule    Refill:  0    Orders Placed This Encounter  Procedures   Urine Culture   Microalbumin / creatinine urine ratio   CBC with Differential/Platelet   Comprehensive metabolic panel   TSH   Brain natriuretic peptide   POCT Urinalysis Dipstick (Automated)    Patient Instructions  Urine suspicious for ongoing infection despite doxycycline.  Urine culture sent. In the meantime, start keflex antibiotic 500mg  twice daily for 7 days.  Labs today.  We will be in touch with results and plan for leg swelling.  Let me know if ongoing constipation, may use miralax and senna as needed. Limit lomotil use to one at a time, sparingly, for diarrhea.  Good to see you today.   Follow up plan: No follow-ups on file.  Eustaquio Boyden, James Bell

## 2023-06-22 NOTE — Assessment & Plan Note (Signed)
Will request latest diabetic foot exam [facility] ortho, Midwife)

## 2023-06-22 NOTE — Assessment & Plan Note (Addendum)
Notes progressive pedal edema over last few weeks.  Reviewed broad ddx of this. Doubt liver or cardiac related. Overall good protein intake.  Check labs today for further evaluation as per below.  Pending results, consider furosemide course PRN.

## 2023-06-25 ENCOUNTER — Telehealth: Payer: Self-pay

## 2023-06-25 DIAGNOSIS — A491 Streptococcal infection, unspecified site: Secondary | ICD-10-CM

## 2023-06-25 DIAGNOSIS — N183 Chronic kidney disease, stage 3 unspecified: Secondary | ICD-10-CM

## 2023-06-25 DIAGNOSIS — E1122 Type 2 diabetes mellitus with diabetic chronic kidney disease: Secondary | ICD-10-CM

## 2023-06-25 DIAGNOSIS — R600 Localized edema: Secondary | ICD-10-CM

## 2023-06-25 DIAGNOSIS — N401 Enlarged prostate with lower urinary tract symptoms: Secondary | ICD-10-CM

## 2023-06-25 DIAGNOSIS — N138 Other obstructive and reflux uropathy: Secondary | ICD-10-CM

## 2023-06-25 DIAGNOSIS — Z1621 Resistance to vancomycin: Secondary | ICD-10-CM

## 2023-06-25 DIAGNOSIS — N3001 Acute cystitis with hematuria: Secondary | ICD-10-CM

## 2023-06-25 DIAGNOSIS — M6281 Muscle weakness (generalized): Secondary | ICD-10-CM | POA: Diagnosis not present

## 2023-06-25 LAB — URINE CULTURE
MICRO NUMBER:: 15553955
SPECIMEN QUALITY:: ADEQUATE

## 2023-06-25 MED ORDER — AMOXICILLIN 875 MG PO TABS: 875.0000 mg | ORAL_TABLET | Freq: Two times a day (BID) | ORAL | 0 refills | Status: AC

## 2023-06-25 NOTE — Telephone Encounter (Signed)
Please notify - UCx grew vanc resistant enterococcus infection.  It was sensitive to ampicillin so I'd like to change his antibiotic from Keflex to amoxicillin 875mg  BID x10d and do recommend repeat urine culture after completing antibiotic course to ensure infection has resolved.

## 2023-06-25 NOTE — Telephone Encounter (Signed)
CRITICAL VALUE STICKER  CRITICAL VALUE: vancomycin resistant Enterococcus faecalis   RECEIVER (on-site recipient of call):James Bell    DATE & TIME NOTIFIED:  06/25/23 1:24 PM  MESSENGER (representative from lab): Roe Coombs   MD NOTIFIED: Sending High priority message as well as documenting in lab book   TIME OF NOTIFICATION: 1:24 PM   RESPONSE:  will await message with instructions from Dr. Sharen Hones

## 2023-06-25 NOTE — Telephone Encounter (Signed)
Spoke with pt relaying Dr Timoteo Expose message. Pt verbalizes understanding and will call back to schedule lab visit to rpt ucx repeat after completing abx to ensure infection has resolved.

## 2023-06-25 NOTE — Addendum Note (Signed)
Addended by: Eustaquio Boyden on: 06/25/2023 02:05 PM   Modules accepted: Orders

## 2023-06-26 DIAGNOSIS — Z0100 Encounter for examination of eyes and vision without abnormal findings: Secondary | ICD-10-CM | POA: Diagnosis not present

## 2023-06-26 DIAGNOSIS — H401133 Primary open-angle glaucoma, bilateral, severe stage: Secondary | ICD-10-CM | POA: Diagnosis not present

## 2023-06-26 DIAGNOSIS — R2681 Unsteadiness on feet: Secondary | ICD-10-CM | POA: Diagnosis not present

## 2023-06-26 DIAGNOSIS — Z9181 History of falling: Secondary | ICD-10-CM | POA: Diagnosis not present

## 2023-06-26 DIAGNOSIS — H532 Diplopia: Secondary | ICD-10-CM | POA: Diagnosis not present

## 2023-06-26 DIAGNOSIS — R2689 Other abnormalities of gait and mobility: Secondary | ICD-10-CM | POA: Diagnosis not present

## 2023-06-27 DIAGNOSIS — E1165 Type 2 diabetes mellitus with hyperglycemia: Secondary | ICD-10-CM | POA: Diagnosis not present

## 2023-06-27 DIAGNOSIS — L84 Corns and callosities: Secondary | ICD-10-CM | POA: Diagnosis not present

## 2023-06-27 DIAGNOSIS — M2041 Other hammer toe(s) (acquired), right foot: Secondary | ICD-10-CM | POA: Diagnosis not present

## 2023-06-27 DIAGNOSIS — Z8631 Personal history of diabetic foot ulcer: Secondary | ICD-10-CM | POA: Diagnosis not present

## 2023-06-27 DIAGNOSIS — M7661 Achilles tendinitis, right leg: Secondary | ICD-10-CM | POA: Diagnosis not present

## 2023-06-27 DIAGNOSIS — B351 Tinea unguium: Secondary | ICD-10-CM | POA: Diagnosis not present

## 2023-06-27 DIAGNOSIS — M216X1 Other acquired deformities of right foot: Secondary | ICD-10-CM | POA: Diagnosis not present

## 2023-06-27 DIAGNOSIS — L603 Nail dystrophy: Secondary | ICD-10-CM | POA: Diagnosis not present

## 2023-06-27 DIAGNOSIS — E1142 Type 2 diabetes mellitus with diabetic polyneuropathy: Secondary | ICD-10-CM | POA: Diagnosis not present

## 2023-06-27 DIAGNOSIS — L97521 Non-pressure chronic ulcer of other part of left foot limited to breakdown of skin: Secondary | ICD-10-CM | POA: Diagnosis not present

## 2023-06-27 DIAGNOSIS — I890 Lymphedema, not elsewhere classified: Secondary | ICD-10-CM | POA: Diagnosis not present

## 2023-06-27 DIAGNOSIS — I739 Peripheral vascular disease, unspecified: Secondary | ICD-10-CM | POA: Diagnosis not present

## 2023-06-27 DIAGNOSIS — Z89422 Acquired absence of other left toe(s): Secondary | ICD-10-CM | POA: Diagnosis not present

## 2023-06-29 DIAGNOSIS — M6281 Muscle weakness (generalized): Secondary | ICD-10-CM | POA: Diagnosis not present

## 2023-07-02 ENCOUNTER — Telehealth: Payer: Self-pay

## 2023-07-02 ENCOUNTER — Encounter: Payer: Self-pay | Admitting: Family Medicine

## 2023-07-02 DIAGNOSIS — A491 Streptococcal infection, unspecified site: Secondary | ICD-10-CM | POA: Insufficient documentation

## 2023-07-02 MED ORDER — FUROSEMIDE 20 MG PO TABS: 20.0000 mg | ORAL_TABLET | Freq: Every day | ORAL | 0 refills | Status: DC | PRN

## 2023-07-02 NOTE — Telephone Encounter (Signed)
  Dr. Jeanie Sewer is reqesting a new Rx for his Right AFO. He cannot locate the original. Please advise.    Per office note : I have written an Rx for orthotist to reassess Right AFO since pt feels like Right foot is still dragging with his current PLS carbon fiber AFO.

## 2023-07-02 NOTE — Telephone Encounter (Signed)
error 

## 2023-07-02 NOTE — Telephone Encounter (Signed)
Patient is requesting a  call back when possible regarding this concern, says he is still having some UTI syx

## 2023-07-02 NOTE — Addendum Note (Signed)
Addended by: Eustaquio Boyden on: 07/02/2023 05:58 PM   Modules accepted: Orders

## 2023-07-02 NOTE — Telephone Encounter (Signed)
Rtn pt's call. States he's still taking abx but urinary frequency has worsened. Says he has to urinate about every hr. Pt thought he remembers a discussion about urology referral however, I do not see any mention of that in 06/22/23 OV notes. Plz advise.

## 2023-07-02 NOTE — Telephone Encounter (Addendum)
Happy birthday today! I had offered nephrology referral for worsening kidney function. However given ongoing urinary frequency in setting of VRE, reasonable to see urolgoist as well  I've placed referral to both. I've also sent low dose furosemide for him to try - 20mg  1/2 tab daily PRN leg swelling. Update [name] with effect. Let me know if recurrent dysuria after finishing amoxicillin antibiotic to repeat urine culture.

## 2023-07-03 ENCOUNTER — Encounter: Payer: Self-pay | Admitting: *Deleted

## 2023-07-03 DIAGNOSIS — Z9181 History of falling: Secondary | ICD-10-CM | POA: Diagnosis not present

## 2023-07-03 DIAGNOSIS — R2681 Unsteadiness on feet: Secondary | ICD-10-CM | POA: Diagnosis not present

## 2023-07-03 DIAGNOSIS — R2689 Other abnormalities of gait and mobility: Secondary | ICD-10-CM | POA: Diagnosis not present

## 2023-07-03 NOTE — Telephone Encounter (Signed)
Called patient and reviewed all information. Patient verbalized understanding. Patient will update [name] after he finishes abx if still having dsyuria. He will also update about furosemide. Advised patient he will receive a call from our referral coordinators to get appt scheduled.  Will call if any further questions.

## 2023-07-04 DIAGNOSIS — R269 Unspecified abnormalities of gait and mobility: Secondary | ICD-10-CM | POA: Diagnosis not present

## 2023-07-04 DIAGNOSIS — G8110 Spastic hemiplegia affecting unspecified side: Secondary | ICD-10-CM | POA: Diagnosis not present

## 2023-07-04 DIAGNOSIS — E1140 Type 2 diabetes mellitus with diabetic neuropathy, unspecified: Secondary | ICD-10-CM | POA: Diagnosis not present

## 2023-07-04 NOTE — Telephone Encounter (Signed)
Information faxed by [facility]. Per [facility] not received. Information will be re-faxed.   Information has been received in the [place] office. They will call to get him in for the appointment.

## 2023-07-05 DIAGNOSIS — R2681 Unsteadiness on feet: Secondary | ICD-10-CM | POA: Diagnosis not present

## 2023-07-05 DIAGNOSIS — Z9181 History of falling: Secondary | ICD-10-CM | POA: Diagnosis not present

## 2023-07-05 DIAGNOSIS — R2689 Other abnormalities of gait and mobility: Secondary | ICD-10-CM | POA: Diagnosis not present

## 2023-07-07 DIAGNOSIS — G4733 Obstructive sleep apnea (adult) (pediatric): Secondary | ICD-10-CM | POA: Diagnosis not present

## 2023-07-09 DIAGNOSIS — I69321 Dysphasia following cerebral infarction: Secondary | ICD-10-CM | POA: Diagnosis not present

## 2023-07-09 DIAGNOSIS — R490 Dysphonia: Secondary | ICD-10-CM | POA: Diagnosis not present

## 2023-07-09 DIAGNOSIS — I69391 Dysphagia following cerebral infarction: Secondary | ICD-10-CM | POA: Diagnosis not present

## 2023-07-09 DIAGNOSIS — I69398 Other sequelae of cerebral infarction: Secondary | ICD-10-CM | POA: Diagnosis not present

## 2023-07-10 DIAGNOSIS — R2689 Other abnormalities of gait and mobility: Secondary | ICD-10-CM | POA: Diagnosis not present

## 2023-07-10 DIAGNOSIS — R2681 Unsteadiness on feet: Secondary | ICD-10-CM | POA: Diagnosis not present

## 2023-07-10 DIAGNOSIS — Z9181 History of falling: Secondary | ICD-10-CM | POA: Diagnosis not present

## 2023-07-11 DIAGNOSIS — L97521 Non-pressure chronic ulcer of other part of left foot limited to breakdown of skin: Secondary | ICD-10-CM | POA: Diagnosis not present

## 2023-07-11 DIAGNOSIS — E1142 Type 2 diabetes mellitus with diabetic polyneuropathy: Secondary | ICD-10-CM | POA: Diagnosis not present

## 2023-07-12 ENCOUNTER — Emergency Department
Admission: EM | Admit: 2023-07-12 | Discharge: 2023-07-12 | Disposition: A | Discharge status: Home / Self Care | Payer: Medicare PPO | Attending: Emergency Medicine | Admitting: Emergency Medicine

## 2023-07-12 ENCOUNTER — Other Ambulatory Visit: Payer: Self-pay

## 2023-07-12 ENCOUNTER — Ambulatory Visit: Payer: Medicare PPO | Admitting: Urology

## 2023-07-12 ENCOUNTER — Telehealth: Payer: Self-pay | Admitting: Urology

## 2023-07-12 ENCOUNTER — Encounter: Payer: Self-pay | Admitting: Urology

## 2023-07-12 ENCOUNTER — Encounter: Payer: Self-pay | Admitting: *Deleted

## 2023-07-12 VITALS — BP 120/57 | HR 91 | Ht 68.0 in | Wt 190.0 lb

## 2023-07-12 DIAGNOSIS — N390 Urinary tract infection, site not specified: Secondary | ICD-10-CM

## 2023-07-12 DIAGNOSIS — Z87440 Personal history of urinary (tract) infections: Secondary | ICD-10-CM | POA: Diagnosis not present

## 2023-07-12 DIAGNOSIS — R399 Unspecified symptoms and signs involving the genitourinary system: Secondary | ICD-10-CM | POA: Diagnosis not present

## 2023-07-12 DIAGNOSIS — R5383 Other fatigue: Secondary | ICD-10-CM | POA: Diagnosis not present

## 2023-07-12 DIAGNOSIS — N401 Enlarged prostate with lower urinary tract symptoms: Secondary | ICD-10-CM

## 2023-07-12 DIAGNOSIS — R300 Dysuria: Secondary | ICD-10-CM

## 2023-07-12 LAB — CBC WITH DIFFERENTIAL/PLATELET
Abs Immature Granulocytes: 0.03 10*3/uL (ref 0.00–0.07)
Basophils Absolute: 0.1 10*3/uL (ref 0.0–0.1)
Basophils Relative: 1 %
Eosinophils Absolute: 0.3 10*3/uL (ref 0.0–0.5)
Eosinophils Relative: 3 %
HCT: 40.7 % (ref 39.0–52.0)
Hemoglobin: 13.7 g/dL (ref 13.0–17.0)
Immature Granulocytes: 0 %
Lymphocytes Relative: 26 %
Lymphs Abs: 2.4 10*3/uL (ref 0.7–4.0)
MCH: 30 pg (ref 26.0–34.0)
MCHC: 33.7 g/dL (ref 30.0–36.0)
MCV: 89.3 fL (ref 80.0–100.0)
Monocytes Absolute: 0.9 10*3/uL (ref 0.1–1.0)
Monocytes Relative: 10 %
Neutro Abs: 5.6 10*3/uL (ref 1.7–7.7)
Neutrophils Relative %: 60 %
Platelets: 202 10*3/uL (ref 150–400)
RBC: 4.56 MIL/uL (ref 4.22–5.81)
RDW: 13.5 % (ref 11.5–15.5)
WBC: 9.3 10*3/uL (ref 4.0–10.5)
nRBC: 0 % (ref 0.0–0.2)

## 2023-07-12 LAB — COMPREHENSIVE METABOLIC PANEL
ALT: 20 U/L (ref 0–44)
AST: 27 U/L (ref 15–41)
Albumin: 3.7 g/dL (ref 3.5–5.0)
Alkaline Phosphatase: 46 U/L (ref 38–126)
Anion gap: 11 (ref 5–15)
BUN: 30 mg/dL — ABNORMAL HIGH (ref 8–23)
CO2: 20 mmol/L — ABNORMAL LOW (ref 22–32)
Calcium: 9 mg/dL (ref 8.9–10.3)
Chloride: 106 mmol/L (ref 98–111)
Creatinine, Ser: 1.57 mg/dL — ABNORMAL HIGH (ref 0.61–1.24)
GFR, Estimated: 42 mL/min — ABNORMAL LOW (ref >60)
Glucose, Bld: 203 mg/dL — ABNORMAL HIGH (ref 70–99)
Potassium: 3.9 mmol/L (ref 3.5–5.1)
Sodium: 137 mmol/L (ref 135–145)
Total Bilirubin: 1 mg/dL (ref 0.3–1.2)
Total Protein: 7.3 g/dL (ref 6.5–8.1)

## 2023-07-12 LAB — URINALYSIS, COMPLETE
Bilirubin, UA: NEGATIVE
Glucose, UA: NEGATIVE
Ketones, UA: NEGATIVE
Nitrite, UA: NEGATIVE
Specific Gravity, UA: 1.025 (ref 1.005–1.030)
Urobilinogen, Ur: 0.2 mg/dL (ref 0.2–1.0)
pH, UA: 5.5 (ref 5.0–7.5)

## 2023-07-12 LAB — BLADDER SCAN AMB NON-IMAGING: Scan Result: 5

## 2023-07-12 LAB — MICROSCOPIC EXAMINATION: WBC, UA: >30 /[HPF] — AB (ref 0–5)

## 2023-07-12 MED ORDER — GEMTESA 75 MG PO TABS: 75.0000 mg | ORAL_TABLET | Freq: Every day | ORAL | Status: DC

## 2023-07-12 MED ORDER — CEPHALEXIN 500 MG PO CAPS
500.0000 mg | ORAL_CAPSULE | Freq: Once | ORAL | Status: AC
  Administered 2023-07-12: 500 mg via ORAL
  Filled 2023-07-12: qty 1

## 2023-07-12 MED ORDER — CEPHALEXIN 500 MG PO CAPS: 500.0000 mg | ORAL_CAPSULE | Freq: Four times a day (QID) | ORAL | 0 refills | Status: AC

## 2023-07-12 NOTE — Telephone Encounter (Signed)
Pt was in office this morning and forgot to ask Dr Lonna Cobb for an RX for diflucan.

## 2023-07-12 NOTE — ED Notes (Signed)
Repositioned multiple times in recliner. Unable to stay in recliner, remains fidgety and restless, sliding out the end and sides. Moved to stretcher. Wife remains at side.

## 2023-07-12 NOTE — ED Notes (Signed)
[facility] called and given report.

## 2023-07-12 NOTE — ED Triage Notes (Addendum)
Here by EMS from [facility] for multiple sx. C/c is "UTI pain", dysuria, frequency, urgency, can't sit still, restless, leg pain, generally weak, and can't sleep, as well as diarrhea. Relates diarrhea to amoxicillin that he is finishing up. Denies back, flank pain or fever. Alert, NAD, interactive, Spouse present. Intermittent restless legs/ spasms present.

## 2023-07-12 NOTE — Progress Notes (Signed)
I, James Bell, acting as a scribe for James Altes, MD., have documented all relevant documentation on the behalf of James Altes, MD, as directed by James Altes, MD while in the presence of James Altes, MD.  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 [place],  [place] [address]  Chief Complaint  Patient presents with   Benign Prostatic Hypertrophy   Urologic history: 1. BPH Underwent PVP ~ 2010  2. 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  I initially saw him in July 2022 after an episode of urinary retention. He did not follow up for a 1 month bladder scan.  He states since that time he has had persistent voiding symptoms including urinary frequency, urgency, intermittent stream, nocturia x4.  IPSS today 24/35.  He has mild dysuria and was recently treated for a vancomycin-resistant enterococcal UTI.  He is taking tamsulosin 0.4 mg daily. No gross hematuria or flank/abdominal/pelvic pain.   PMH: Past Medical History:  Diagnosis Date   Anemia    Basal cell carcinoma 2018   R temple    Basal cell carcinoma of nose 2002   bridge of nose   BPH with obstruction/lower urinary tract symptoms    failed flomax, uroxatral, myrbetriq - sees urology Patsi Sears) pending videourodynamics   CKD (chronic kidney disease) stage 3, GFR 30-59 ml/min (HCC) 03/11/2012   Renal US 02/2014 - multiple kidney cysts    CVA (cerebral infarction) 03/07/2012   slurred speech; right hemplegia, left handed - completed PT 07/2014 (17 sessions)   Dehydration    Depression    mild, on cymbalta per prior PCP   Erectile dysfunction    per urology (prostaglandin injection)   GERD (gastroesophageal reflux  disease)    with sliding HH   H/O hiatal hernia    History of kidney problems 12/2010   lacerated left kidney after fall   HLD (hyperlipidemia)    Hx of basal cell carcinoma 07/10/2017   R. lat. forehead near hair line   Kidney cysts 02/2014   bilateral (renal US)   Migraines 03/11/2012   now rare   OSA on CPAP    sleep study 01/2014 - severe, CPAP at 10, previously on nuvigil (Dr. Gareth Morgan)   Palpitations 01/2012   holter WNL, rare PAC/PVCs   Presbycusis of both ears 2016   rec amplification [facility])   Restless leg syndrome    well controlled on mirapex   Rosacea    James Bell   TIA (transient ischemic attack)    Type 2 diabetes, controlled, with neuropathy (HCC) 2011   Dr Sharl Ma in [facility]    Surgical History: Past Surgical History:  Procedure Laterality Date   ABI  09/18/2005   WNL   AMPUTATION TOE Left 03/02/2023   Procedure: 65784 - TOE INTERPHALANGEAL JOINT - SECOND;  Surgeon: Rosetta Posner, DPM;  Location: [facility] ORS;  Service: Podiatry;  Laterality: Left;   CARDIAC CATHETERIZATION  09/18/2002   normal; [facility]   CARDIOVASCULAR STRESS TEST  02/16/2014   WNL, low risk scan, EF 68%   CAROTID ENDARTERECTOMY     pt denies 02/27/23   CATARACT EXTRACTION W/ INTRAOCULAR LENS IMPLANT  11//2012 - 08/2011   COLONOSCOPY  03/19/2011  poor prep, unable to biopsy polyp in tic fold, rpt 3 mo James Bell)   COLONOSCOPY  06/19/2011   mult polyps adenomatous, severe diverticulosis, rpt 3 yrs James Bell)   CYSTOSCOPY WITH URETHRAL DILATATION N/A 07/24/2013   Procedure: CYSTOSCOPY WITH URETHRAL DILATATION;  Surgeon: Kathi Ludwig, MD;  Location: [facility] ORS;  Service: Urology;  Laterality: N/A;   ESOPHAGOGASTRODUODENOSCOPY  09/18/2004   duodenitis, medual HH, Grade 2 reflux, mild gastritis   ESOPHAGOGASTRODUODENOSCOPY  07/20/2011   mild chronic gastritis   HEMORRHOID SURGERY  09/18/1989   INGUINAL HERNIA REPAIR  1981; 2002   left, then repaired   PROSTATE SURGERY  02/16/2009   PVP  (laser)   SKIN CANCER EXCISION  09/18/2000   bridge of nose, [facility]CC   TONSILLECTOMY AND ADENOIDECTOMY  ~ 1945   TRANSURETHRAL INCISION OF PROSTATE N/A 07/24/2013   Procedure: TRANSURETHRAL INCISION OF THE PROSTATE (TUIP);  Surgeon: Kathi Ludwig, MD;  Location: [facility] ORS;  Service: Urology;  Laterality: N/A;   URETHRAL DILATION  09/18/2012   tannenbaum    Home Medications:  Allergies as of 07/12/2023       Reactions   Dulaglutide    Other reaction(s): upset stomach Other reaction(s): Abdominal Pain, upset stomach Other reaction(s): upset stomach   Empagliflozin Other (See Comments)   Other reaction(s): yeast infection Other reaction(s): Other (See Comments) Other reaction(s): yeast infection   Lisinopril Cough   tachycardia   Nexium [esomeprazole] Other (See Comments)   Headache   Oxycodone Other (See Comments)   Even 1/2 tablet of 5mg  caused increased confusion - contributory to hospitalization 10/2020 with AMS   Pregabalin Nausea Only, Other (See Comments)   Other reaction(s): MALAISE, SLIGHT NAUSEA, SLIGHT HEADACHE Other reaction(s): MALAISE, SLIGHT NAUSEA, SLIGHT HEADACHE   Rosuvastatin Other (See Comments)   Other reaction(s): muscle pain 2.16.2022 Pt is current on this medication without any issues. Other reaction(s): muscle pain 2.16.2022 Pt is current on this medication without any issues. Other reaction(s): muscle pain        Medication List        Accurate as of July 12, 2023 10:31 AM. If you have any questions, ask your nurse or doctor.          Alpha-Lipoic Acid 600 MG Caps Take 1 capsule (600 mg total) by mouth daily.   ascorbic acid 500 MG tablet Commonly known as: VITAMIN C Take 1,000 mg by mouth daily as needed.   brimonidine-timolol 0.2-0.5 % ophthalmic solution Commonly known as: COMBIGAN Place 1 drop into both eyes every 12 (twelve) hours.   clopidogrel 75 MG tablet Commonly known as: PLAVIX TAKE 1 TABLET DAILY   COQ10 150 MG  Caps Take 1 capsule by mouth daily.   DULoxetine 60 MG capsule Commonly known as: CYMBALTA TAKE 1 CAPSULE DAILY What changed: when to take this   famotidine 20 MG tablet Commonly known as: Pepcid Take 1 tablet (20 mg total) by mouth at bedtime.   fluticasone 110 MCG/ACT inhaler Commonly known as: Flovent HFA Inhale 1 puff into the lungs in the morning and at bedtime.   furosemide 20 MG tablet Commonly known as: LASIX Take 1 tablet (20 mg total) by mouth daily as needed for edema or fluid (take in am).   gabapentin 300 MG capsule Commonly known as: NEURONTIN Take 2 capsules (600 mg total) by mouth 3 (three) times daily.   Gemtesa 75 MG Tabs Generic drug: Vibegron Take 1 tablet (75 mg total) by mouth daily. Started by: Verna Czech  Lanny Lipkin   insulin aspart 100 UNIT/ML injection Commonly known as: novoLOG CBG 70 - 120: 0 units  CBG 121 - 150: 1 unit  CBG 151 - 200: 2 units  CBG 201 - 250: 3 units  CBG 251 - 300: 5 units  CBG 301 - 350: 7 units  CBG 351 - 400: 9 units   levothyroxine 50 MCG tablet Commonly known as: SYNTHROID Take 1 tablet (50 mcg total) by mouth daily before breakfast. Per endo Dr Sharl Ma   mupirocin ointment 2 % Commonly known as: BACTROBAN Place 1 application. into the nose 2 (two) times daily.   nitroGLYCERIN 0.4 MG SL tablet Commonly known as: Nitrostat DISSOLVE 1 TABLET UNDER THE TONGUE EVERY 5 MINUTES AS NEEDED FOR CHEST PAIN   nystatin 100000 UNIT/ML suspension Commonly known as: MYCOSTATIN Take 5 mLs (500,000 Units total) by mouth 4 (four) times daily.   nystatin cream Commonly known as: MYCOSTATIN Apply 1 Application topically 2 (two) times daily. Apply to affected area   ondansetron 4 MG tablet Commonly known as: ZOFRAN Take 1 tablet (4 mg total) by mouth daily as needed for nausea or vomiting.   Ozempic (1 MG/DOSE) 4 MG/3ML Sopn Generic drug: Semaglutide (1 MG/DOSE) Inject 1 mg into the skin once a week. Saturday   polyethylene  glycol 17 g packet Commonly known as: MIRALAX / GLYCOLAX Take 17 g by mouth daily as needed.   pramipexole 1 MG tablet Commonly known as: MIRAPEX TAKE 1 TABLET DAILY AT 4 P.M.   RABEprazole 20 MG tablet Commonly known as: Aciphex Take 1 tablet (20 mg total) by mouth daily.   rosuvastatin 40 MG tablet Commonly known as: CRESTOR TAKE 1 TABLET DAILY What changed: when to take this   sennosides-docusate sodium 8.6-50 MG tablet Commonly known as: SENOKOT-S Take 1 tablet by mouth daily.   sodium chloride 5 % ophthalmic solution Commonly known as: MURO 128 1 drop as needed for eye irritation.   tamsulosin 0.4 MG Caps capsule Commonly known as: FLOMAX TAKE 1 CAPSULE DAILY AFTER BREAKFAST   Tresiba FlexTouch 200 UNIT/ML FlexTouch Pen Generic drug: insulin degludec Inject 90 Units into the skin at bedtime.   UltiCare Short Pen Needles 31G X 8 MM Misc Generic drug: Insulin Pen Needle USE TO INJECT MEDICATION DAILY        Allergies:  Allergies  Allergen Reactions   Dulaglutide     Other reaction(s): upset stomach Other reaction(s): Abdominal Pain, upset stomach Other reaction(s): upset stomach    Empagliflozin Other (See Comments)    Other reaction(s): yeast infection Other reaction(s): Other (See Comments) Other reaction(s): yeast infection    Lisinopril Cough    tachycardia   Nexium [Esomeprazole] Other (See Comments)    Headache   Oxycodone Other (See Comments)    Even 1/2 tablet of 5mg  caused increased confusion - contributory to hospitalization 10/2020 with AMS   Pregabalin Nausea Only and Other (See Comments)    Other reaction(s): MALAISE, SLIGHT NAUSEA, SLIGHT HEADACHE Other reaction(s): MALAISE, SLIGHT NAUSEA, SLIGHT HEADACHE    Rosuvastatin Other (See Comments)    Other reaction(s): muscle pain 2.16.2022 Pt is current on this medication without any issues. Other reaction(s): muscle pain 2.16.2022 Pt is current on this medication without any  issues. Other reaction(s): muscle pain    Family History: Family History  Problem Relation Age of Onset   Stroke Father    Lung cancer Father 68        smoker   Alcoholism Sister  Kidney disease Sister    Stroke Brother    Diabetes Brother 6   Lung cancer Brother    Coronary artery disease Neg Hx    Sleep apnea Neg Hx     Social History:  reports that he has never smoked. He has never been exposed to tobacco smoke. He has never used smokeless tobacco. He reports that he does not currently use alcohol after a past usage of about 4.0 standard drinks of alcohol per week. He reports that he does not use drugs.   Physical Exam: BP (!) 120/57   Pulse 91   Ht 5\' 8"  (1.727 m)   Wt 190 lb (86.2 kg)   BMI 28.89 kg/m   Constitutional:  Alert, No acute distress. HEENT: [place] AT Respiratory: Normal respiratory effort, no increased work of breathing Psychiatric: Normal mood and affect.   Urinalysis Dipstick 2+ blood/2+ protein/2+ leukocytes. Microscopy >30 WBC/3.10 RBC.   Assessment & Plan:    1. BPH with LUTS Severe low urinary tract symptoms.  PVR today 5 mL.  Add Gemtesa 75 mg daily and samples were given.  PA follow-up 1 month for symptom recheck.  2. Recurrent UTI Worsening voiding symptoms and dysuria.  Urine culture ordered.  Will await culture results prior to treatment.  Repeat urinalysis on follow-up.   I have reviewed the above documentation for accuracy and completeness, and I agree with the above.   James Altes, MD  [facility] [address], [address] [place], [place] [address] [phone]

## 2023-07-12 NOTE — Discharge Instructions (Addendum)
Your most recent urine infection grew a specific type of bacteria that needs careful consideration of specific antibiotics for proper treatment.  Your urologist advised that you await the results of the urine culture prior to starting antibiotics for an early appointment today.  However given your symptoms, as we discussed, you wanted to start on an antibiotic based on the results of your urinalysis earlier today.  I prescribed you an antibiotic which may or may not work for your urinary symptoms.  It is very important that you follow-up with your urologist for the results of your urine culture for adjustments of medications as needed.  Thank you for choosing [name] for your health care today!  Please see your primary doctor this week for a follow up appointment.   If you have any new, worsening, or unexpected symptoms call your doctor right away or come back to the emergency department for reevaluation.  It was my pleasure to care for you today.   Daneil Dan Modesto Charon, MD

## 2023-07-12 NOTE — Telephone Encounter (Signed)
We are waiting on the culture to come back first .

## 2023-07-12 NOTE — ED Triage Notes (Signed)
Pt from [facility] via [name]- reports of pain from UTI and has been unable to sleep so feels weak and fatigued.

## 2023-07-12 NOTE — ED Provider Notes (Signed)
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  He was recently treated for vancomycin-resistant enterococcal UTI and was seen by urology earlier this morning for his voiding symptoms and diagnosed with BPH with LUTS and a PVR noted to be 5 mL, they added Gemtesa 75 mg daily and they got a urinalysis which showed bacteriuria and inflammatory changes for which they sent a culture.  The plan was to await the results of the culture prior to starting treatment.  His symptoms are unchanged, he feels dysuria and frequency which is very uncomfortable for him and he would like to start on antibiotics.   External Medical Documents Reviewed: Urine culture from earlier this month with vancomycin-resistant enterococcal UTI, urology visit from earlier today documenting the above      Physical Exam   Triage Vital Signs: ED Triage Vitals  Encounter Vitals Group     BP 07/12/23 1321 92/77     Systolic BP Percentile --      Diastolic BP Percentile --      Pulse Rate 07/12/23 1321 82     Resp 07/12/23 1321 20     Temp 07/12/23 1321 98.1 F (36.7 C)     Temp Source 07/12/23 1321 Oral     SpO2 07/12/23 1321 98 %     Weight 07/12/23 1325 190 lb (86.2 kg)     Height --      Head Circumference --      Peak Flow --      Pain Score --      Pain Loc --      Pain Education --      Exclude from Growth Chart --     Most recent vital signs: Vitals:   07/12/23 1321  BP: 92/77  Pulse: 82  Resp: 20  Temp: 98.1 F (36.7 C)  SpO2: 98%    General: Awake, no distress.  CV:  Good peripheral perfusion.  Resp:  Normal effort.   Abd:  No distention.  Other:  Normal vital signs, afebrile, soft nontender abdomen, appears euvolemic and nontoxic.   ED Results / Procedures / Treatments   Labs (all labs ordered are listed, but only abnormal results are displayed) Labs Reviewed  COMPREHENSIVE METABOLIC PANEL - Abnormal; Notable for the following components:      Result Value   CO2 20 (*)    Glucose, Bld 203 (*)    BUN 30 (*)    Creatinine, Ser 1.57 (*)    GFR, Estimated 42 (*)    All other components within normal limits  CBC WITH DIFFERENTIAL/PLATELET  URINALYSIS, ROUTINE W REFLEX MICROSCOPIC     I ordered and reviewed the above labs they are notable for cell counts electrolytes largely unremarkable unchanged from prior      PROCEDURES:  Critical Care performed: No  Procedures   MEDICATIONS ORDERED IN ED: Medications  cephALEXin (KEFLEX) capsule 500 mg (500 mg Oral Given 07/12/23 1824)    IMPRESSION / MDM / ASSESSMENT AND PLAN / ED COURSE  I reviewed the triage vital signs and the nursing notes.  Patient's presentation is most consistent with acute presentation with potential threat to life or bodily function.  Differential diagnosis includes, but is not limited to, BPH with LUTS, urinary tract infection, considered but less likely sepsis   The patient is on the cardiac monitor to evaluate for evidence of arrhythmia and/or significant heart rate changes.  MDM:    BPH with LUTS and urinary tract infection with a plan to follow-up with cultures prior to initiating treatment per urology notes this morning.  Patient is very insistent on trying some sort of treatment and asking for antibiotics for which I will prescribe Keflex.  Given his recent urine culture showing vancomycin-resistant Enterococcus, advised that he start this medication but closely follow-up with his urologist pending the results of culture for adjustments as needed.  He does not appear septic.   His soft benign abdominal exam rules against any surgical emergencies at this time.  Discharge.       FINAL CLINICAL IMPRESSION(S) / ED DIAGNOSES   Final diagnoses:  Lower urinary tract infectious disease     Rx / DC Orders   ED Discharge Orders          Ordered    cephALEXin (KEFLEX) 500 MG capsule  4 times daily        07/12/23 1750             Note:  This document was prepared using Dragon voice recognition software and may include unintentional dictation errors.    Pilar Jarvis, MD 07/12/23 [phone]

## 2023-07-13 ENCOUNTER — Encounter: Payer: Self-pay | Admitting: Urology

## 2023-07-13 ENCOUNTER — Telehealth: Payer: Self-pay

## 2023-07-13 NOTE — Telephone Encounter (Signed)
Received faxed DWO from Chuck Hint of [facility] [employer].   Placed order in Dr Timoteo Expose box.

## 2023-07-14 ENCOUNTER — Other Ambulatory Visit: Payer: Self-pay | Admitting: Family Medicine

## 2023-07-16 DIAGNOSIS — Z9181 History of falling: Secondary | ICD-10-CM | POA: Diagnosis not present

## 2023-07-16 DIAGNOSIS — I69321 Dysphasia following cerebral infarction: Secondary | ICD-10-CM | POA: Diagnosis not present

## 2023-07-16 DIAGNOSIS — I69398 Other sequelae of cerebral infarction: Secondary | ICD-10-CM | POA: Diagnosis not present

## 2023-07-16 DIAGNOSIS — R2689 Other abnormalities of gait and mobility: Secondary | ICD-10-CM | POA: Diagnosis not present

## 2023-07-16 DIAGNOSIS — R490 Dysphonia: Secondary | ICD-10-CM | POA: Diagnosis not present

## 2023-07-16 DIAGNOSIS — R2681 Unsteadiness on feet: Secondary | ICD-10-CM | POA: Diagnosis not present

## 2023-07-16 DIAGNOSIS — I69391 Dysphagia following cerebral infarction: Secondary | ICD-10-CM | POA: Diagnosis not present

## 2023-07-16 LAB — CULTURE, URINE COMPREHENSIVE

## 2023-07-16 NOTE — Telephone Encounter (Addendum)
Signed and in my outbox.

## 2023-07-17 DIAGNOSIS — R2689 Other abnormalities of gait and mobility: Secondary | ICD-10-CM | POA: Diagnosis not present

## 2023-07-17 DIAGNOSIS — R2681 Unsteadiness on feet: Secondary | ICD-10-CM | POA: Diagnosis not present

## 2023-07-17 DIAGNOSIS — M6281 Muscle weakness (generalized): Secondary | ICD-10-CM | POA: Diagnosis not present

## 2023-07-17 DIAGNOSIS — Z9181 History of falling: Secondary | ICD-10-CM | POA: Diagnosis not present

## 2023-07-17 NOTE — Telephone Encounter (Signed)
Faxed form to [facility] at [phone], attn:  Chuck Hint.

## 2023-07-18 DIAGNOSIS — M6281 Muscle weakness (generalized): Secondary | ICD-10-CM | POA: Diagnosis not present

## 2023-07-19 DIAGNOSIS — E1165 Type 2 diabetes mellitus with hyperglycemia: Secondary | ICD-10-CM | POA: Diagnosis not present

## 2023-07-20 DIAGNOSIS — I69398 Other sequelae of cerebral infarction: Secondary | ICD-10-CM | POA: Diagnosis not present

## 2023-07-20 DIAGNOSIS — M6281 Muscle weakness (generalized): Secondary | ICD-10-CM | POA: Diagnosis not present

## 2023-07-20 DIAGNOSIS — I69391 Dysphagia following cerebral infarction: Secondary | ICD-10-CM | POA: Diagnosis not present

## 2023-07-20 DIAGNOSIS — R490 Dysphonia: Secondary | ICD-10-CM | POA: Diagnosis not present

## 2023-07-20 DIAGNOSIS — I69321 Dysphasia following cerebral infarction: Secondary | ICD-10-CM | POA: Diagnosis not present

## 2023-07-22 ENCOUNTER — Emergency Department (HOSPITAL_COMMUNITY): Payer: Medicare PPO

## 2023-07-22 ENCOUNTER — Emergency Department (HOSPITAL_COMMUNITY)
Admission: EM | Admit: 2023-07-22 | Discharge: 2023-07-23 | Disposition: A | Discharge status: Home / Self Care | Payer: Medicare PPO | Attending: Emergency Medicine | Admitting: Emergency Medicine

## 2023-07-22 ENCOUNTER — Other Ambulatory Visit: Payer: Self-pay

## 2023-07-22 DIAGNOSIS — W010XXA Fall on same level from slipping, tripping and stumbling without subsequent striking against object, initial encounter: Secondary | ICD-10-CM | POA: Insufficient documentation

## 2023-07-22 DIAGNOSIS — Z743 Need for continuous supervision: Secondary | ICD-10-CM | POA: Diagnosis not present

## 2023-07-22 DIAGNOSIS — E1122 Type 2 diabetes mellitus with diabetic chronic kidney disease: Secondary | ICD-10-CM | POA: Insufficient documentation

## 2023-07-22 DIAGNOSIS — S3993XA Unspecified injury of pelvis, initial encounter: Secondary | ICD-10-CM | POA: Diagnosis not present

## 2023-07-22 DIAGNOSIS — N481 Balanitis: Secondary | ICD-10-CM | POA: Diagnosis not present

## 2023-07-22 DIAGNOSIS — S299XXA Unspecified injury of thorax, initial encounter: Secondary | ICD-10-CM | POA: Diagnosis not present

## 2023-07-22 DIAGNOSIS — W19XXXA Unspecified fall, initial encounter: Secondary | ICD-10-CM

## 2023-07-22 DIAGNOSIS — N189 Chronic kidney disease, unspecified: Secondary | ICD-10-CM | POA: Insufficient documentation

## 2023-07-22 DIAGNOSIS — N3000 Acute cystitis without hematuria: Secondary | ICD-10-CM | POA: Insufficient documentation

## 2023-07-22 DIAGNOSIS — I700 Atherosclerosis of aorta: Secondary | ICD-10-CM | POA: Diagnosis not present

## 2023-07-22 DIAGNOSIS — Z7984 Long term (current) use of oral hypoglycemic drugs: Secondary | ICD-10-CM | POA: Diagnosis not present

## 2023-07-22 DIAGNOSIS — R6889 Other general symptoms and signs: Secondary | ICD-10-CM | POA: Diagnosis not present

## 2023-07-22 DIAGNOSIS — S0990XA Unspecified injury of head, initial encounter: Secondary | ICD-10-CM | POA: Diagnosis not present

## 2023-07-22 DIAGNOSIS — Z794 Long term (current) use of insulin: Secondary | ICD-10-CM | POA: Diagnosis not present

## 2023-07-22 DIAGNOSIS — R9431 Abnormal electrocardiogram [ECG] [EKG]: Secondary | ICD-10-CM | POA: Diagnosis not present

## 2023-07-22 DIAGNOSIS — R531 Weakness: Secondary | ICD-10-CM | POA: Diagnosis not present

## 2023-07-22 DIAGNOSIS — S0001XA Abrasion of scalp, initial encounter: Secondary | ICD-10-CM | POA: Diagnosis not present

## 2023-07-22 DIAGNOSIS — S199XXA Unspecified injury of neck, initial encounter: Secondary | ICD-10-CM | POA: Diagnosis not present

## 2023-07-22 DIAGNOSIS — R300 Dysuria: Secondary | ICD-10-CM | POA: Diagnosis not present

## 2023-07-22 DIAGNOSIS — N200 Calculus of kidney: Secondary | ICD-10-CM | POA: Diagnosis not present

## 2023-07-22 DIAGNOSIS — I672 Cerebral atherosclerosis: Secondary | ICD-10-CM | POA: Diagnosis not present

## 2023-07-22 DIAGNOSIS — S3991XA Unspecified injury of abdomen, initial encounter: Secondary | ICD-10-CM | POA: Diagnosis not present

## 2023-07-22 DIAGNOSIS — Z7902 Long term (current) use of antithrombotics/antiplatelets: Secondary | ICD-10-CM | POA: Insufficient documentation

## 2023-07-22 DIAGNOSIS — R109 Unspecified abdominal pain: Secondary | ICD-10-CM | POA: Diagnosis not present

## 2023-07-22 DIAGNOSIS — I6782 Cerebral ischemia: Secondary | ICD-10-CM | POA: Diagnosis not present

## 2023-07-22 DIAGNOSIS — R0902 Hypoxemia: Secondary | ICD-10-CM | POA: Diagnosis not present

## 2023-07-22 LAB — CBC
HCT: 39.7 % (ref 39.0–52.0)
Hemoglobin: 12.9 g/dL — ABNORMAL LOW (ref 13.0–17.0)
MCH: 29.2 pg (ref 26.0–34.0)
MCHC: 32.5 g/dL (ref 30.0–36.0)
MCV: 89.8 fL (ref 80.0–100.0)
Platelets: 179 10*3/uL (ref 150–400)
RBC: 4.42 MIL/uL (ref 4.22–5.81)
RDW: 13.4 % (ref 11.5–15.5)
WBC: 8.5 10*3/uL (ref 4.0–10.5)
nRBC: 0 % (ref 0.0–0.2)

## 2023-07-22 LAB — URINALYSIS, ROUTINE W REFLEX MICROSCOPIC
Bilirubin Urine: NEGATIVE
Glucose, UA: 50 mg/dL — AB
Ketones, ur: NEGATIVE mg/dL
Nitrite: NEGATIVE
Protein, ur: 100 mg/dL — AB
Specific Gravity, Urine: 1.013 (ref 1.005–1.030)
WBC, UA: >50 WBC/hpf (ref 0–5)
pH: 5 (ref 5.0–8.0)

## 2023-07-22 LAB — COMPREHENSIVE METABOLIC PANEL
ALT: 17 U/L (ref 0–44)
AST: 19 U/L (ref 15–41)
Albumin: 2.9 g/dL — ABNORMAL LOW (ref 3.5–5.0)
Alkaline Phosphatase: 41 U/L (ref 38–126)
Anion gap: 9 (ref 5–15)
BUN: 30 mg/dL — ABNORMAL HIGH (ref 8–23)
CO2: 22 mmol/L (ref 22–32)
Calcium: 8.8 mg/dL — ABNORMAL LOW (ref 8.9–10.3)
Chloride: 106 mmol/L (ref 98–111)
Creatinine, Ser: 1.7 mg/dL — ABNORMAL HIGH (ref 0.61–1.24)
GFR, Estimated: 38 mL/min — ABNORMAL LOW (ref >60)
Glucose, Bld: 176 mg/dL — ABNORMAL HIGH (ref 70–99)
Potassium: 4.1 mmol/L (ref 3.5–5.1)
Sodium: 137 mmol/L (ref 135–145)
Total Bilirubin: 0.6 mg/dL (ref 0.3–1.2)
Total Protein: 6.9 g/dL (ref 6.5–8.1)

## 2023-07-22 LAB — TROPONIN I (HIGH SENSITIVITY)
Troponin I (High Sensitivity): 6 ng/L (ref <18)
Troponin I (High Sensitivity): 5 ng/L (ref <18)

## 2023-07-22 LAB — I-STAT CHEM 8, ED
BUN: 32 mg/dL — ABNORMAL HIGH (ref 8–23)
Calcium, Ion: 1.21 mmol/L (ref 1.15–1.40)
Chloride: 107 mmol/L (ref 98–111)
Creatinine, Ser: 1.9 mg/dL — ABNORMAL HIGH (ref 0.61–1.24)
Glucose, Bld: 174 mg/dL — ABNORMAL HIGH (ref 70–99)
HCT: 40 % (ref 39.0–52.0)
Hemoglobin: 13.6 g/dL (ref 13.0–17.0)
Potassium: 4.2 mmol/L (ref 3.5–5.1)
Sodium: 140 mmol/L (ref 135–145)
TCO2: 22 mmol/L (ref 22–32)

## 2023-07-22 LAB — I-STAT CG4 LACTIC ACID, ED: Lactic Acid, Venous: 0.9 mmol/L (ref 0.5–1.9)

## 2023-07-22 LAB — ETHANOL: Alcohol, Ethyl (B): <10 mg/dL (ref <10)

## 2023-07-22 MED ORDER — GABAPENTIN 300 MG PO CAPS
300.0000 mg | ORAL_CAPSULE | Freq: Once | ORAL | Status: AC
  Administered 2023-07-22: 300 mg via ORAL
  Filled 2023-07-22: qty 1

## 2023-07-22 MED ORDER — LINEZOLID 600 MG PO TABS
600.0000 mg | ORAL_TABLET | Freq: Two times a day (BID) | ORAL | Status: DC
  Administered 2023-07-22: 600 mg via ORAL
  Filled 2023-07-22: qty 1

## 2023-07-22 MED ORDER — NYSTATIN 100000 UNIT/GM EX CREA: 1.0000 | TOPICAL_CREAM | Freq: Two times a day (BID) | CUTANEOUS | 0 refills | Status: DC

## 2023-07-22 MED ORDER — SODIUM CHLORIDE 0.9 % IV BOLUS
1000.0000 mL | Freq: Once | INTRAVENOUS | Status: AC
  Administered 2023-07-22: 1000 mL via INTRAVENOUS

## 2023-07-22 MED ORDER — LINEZOLID 600 MG PO TABS: 600.0000 mg | ORAL_TABLET | Freq: Two times a day (BID) | ORAL | 0 refills | Status: AC

## 2023-07-22 MED ORDER — FLUCONAZOLE 150 MG PO TABS
150.0000 mg | ORAL_TABLET | Freq: Once | ORAL | Status: AC
  Administered 2023-07-22: 150 mg via ORAL
  Filled 2023-07-22: qty 1

## 2023-07-22 NOTE — ED Provider Notes (Signed)
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  Patient states that he was weak and slipped and fell between his bed and the wheelchair.  Patient did hit his head.  Patient states that a week ago he was in [place] and was thought to have a UTI but no urinalysis was sent.  Patient's recent urine culture from about a month ago grew vancomycin-resistant Enterococcus.  Patient did finish Gemtesa and was prescribed Keflex.  The history is provided by the patient.       Home Medications Prior to Admission medications   Medication Sig Start Date End Date Taking? Authorizing Provider  Alpha-Lipoic Acid 600 MG CAPS Take 1 capsule (600 mg total) by mouth daily. 06/28/21   Eustaquio Boyden, MD  brimonidine-timolol (COMBIGAN) 0.2-0.5 % ophthalmic solution Place 1 drop into both eyes every 12 (twelve) hours.    [provider]  clopidogrel (PLAVIX) 75 MG tablet TAKE 1 TABLET DAILY 04/18/23   Eustaquio Boyden, MD  Coenzyme Q10 (COQ10) 150 MG CAPS Take 1 capsule by mouth daily.    [provider]  DULoxetine (CYMBALTA) 60 MG capsule TAKE 1 CAPSULE DAILY Patient taking differently: Take 60 mg by mouth at bedtime. 08/29/22   Eustaquio Boyden, MD  famotidine (PEPCID) 20 MG tablet Take 1 tablet (20 mg total) by mouth at bedtime. 04/05/21   Kathlen Mody, MD  fluticasone (FLOVENT HFA) 110 MCG/ACT inhaler Inhale 1 puff into the lungs in the morning and at bedtime. 06/03/22   Eustaquio Boyden, MD  furosemide (LASIX) 20 MG tablet TAKE 1 TABLET(20 MG) BY MOUTH DAILY IN THE MORNING FLUID AS NEEDED FOR SWELLING 07/16/23   Eustaquio Boyden, MD  gabapentin (NEURONTIN) 300 MG capsule Take 2 capsules (600 mg total) by mouth 3 (three) times daily.  04/18/23   Eustaquio Boyden, MD  insulin aspart (NOVOLOG) 100 UNIT/ML injection CBG 70 - 120: 0 units  CBG 121 - 150: 1 unit  CBG 151 - 200: 2 units  CBG 201 - 250: 3 units  CBG 251 - 300: 5 units  CBG 301 - 350: 7 units  CBG 351 - 400: 9 units 04/05/21   Kathlen Mody, MD  insulin degludec (TRESIBA FLEXTOUCH) 200 UNIT/ML FlexTouch Pen Inject 90 Units into the skin at bedtime.    [provider]  Insulin Pen Needle (ULTICARE SHORT PEN NEEDLES) 31G X 8 MM MISC USE TO INJECT MEDICATION DAILY 12/18/22   Eustaquio Boyden, MD  levothyroxine (SYNTHROID) 50 MCG tablet Take 1 tablet (50 mcg total) by mouth daily before breakfast. Per endo Dr Sharl Ma 12/07/19   Eustaquio Boyden, MD  mupirocin ointment (BACTROBAN) 2 % Place 1 application. into the nose 2 (two) times daily. 01/03/22   Delfino Lovett, MD  nitroGLYCERIN (NITROSTAT) 0.4 MG SL tablet DISSOLVE 1 TABLET UNDER THE TONGUE EVERY 5 MINUTES AS NEEDED FOR CHEST PAIN 07/28/16   Eustaquio Boyden, MD  nystatin (MYCOSTATIN) 100000 UNIT/ML suspension Take 5 mLs (500,000 Units total) by mouth 4 (four) times daily. 03/06/23   Eustaquio Boyden, MD  nystatin cream (MYCOSTATIN) Apply 1 Application topically 2 (two) times daily. Apply to affected area 03/06/23   Eustaquio Boyden, MD  ondansetron (ZOFRAN) 4 MG tablet Take 1  tablet (4 mg total) by mouth daily as needed for nausea or vomiting. 05/24/23   Eustaquio Boyden, MD  polyethylene glycol (MIRALAX / GLYCOLAX) 17 g packet Take 17 g by mouth daily as needed. 04/05/21   Kathlen Mody, MD  pramipexole (MIRAPEX) 1 MG tablet TAKE 1 TABLET DAILY AT 4 P.M. 09/13/22   Eustaquio Boyden, MD  RABEprazole (ACIPHEX) 20 MG tablet Take 1 tablet (20 mg total) by mouth daily. 09/08/22   Eustaquio Boyden, MD  rosuvastatin (CRESTOR) 40 MG tablet TAKE 1 TABLET DAILY Patient taking differently: Take 40 mg by mouth at bedtime. 09/13/22   Eustaquio Boyden, MD  Semaglutide, 1 MG/DOSE, (OZEMPIC, 1 MG/DOSE,) 4 MG/3ML SOPN Inject 1  mg into the skin once a week. Saturday 01/06/22   Eustaquio Boyden, MD  sennosides-docusate sodium (SENOKOT-S) 8.6-50 MG tablet Take 1 tablet by mouth daily.    [provider]  sodium chloride (MURO 128) 5 % ophthalmic solution 1 drop as needed for eye irritation.    [provider]  tamsulosin (FLOMAX) 0.4 MG CAPS capsule TAKE 1 CAPSULE DAILY AFTER BREAKFAST 07/19/22   Eustaquio Boyden, MD  Vibegron (GEMTESA) 75 MG TABS Take 1 tablet (75 mg total) by mouth daily. 07/12/23   Stoioff, Verna Czech, MD  vitamin C (ASCORBIC ACID) 500 MG tablet Take 1,000 mg by mouth daily as needed.    [provider]      Allergies    Dulaglutide, Empagliflozin, Lisinopril, Nexium [esomeprazole], Oxycodone, Pregabalin, and Rosuvastatin    Review of Systems   Review of Systems  Neurological:  Positive for dizziness and weakness.  All other systems reviewed and are negative.   Physical Exam Updated Vital Signs BP (!) 139/48   Pulse 62   Temp 98.3 F (36.8 C)   Resp 16   Ht 5\' 8"  (1.727 m)   Wt 83.9 kg   SpO2 93%   BMI 28.13 kg/m  Physical Exam Vitals and nursing note reviewed.  Constitutional:      Comments: Chronically ill   HENT:     Head: Normocephalic.     Comments: Abrasion right scalp    Nose: Nose normal.     Mouth/Throat:     Mouth: Mucous membranes are moist.  Eyes:     Extraocular Movements: Extraocular movements intact.     Pupils: Pupils are equal, round, and reactive to light.  Cardiovascular:     Rate and Rhythm: Normal rate and regular rhythm.     Pulses: Normal pulses.     Heart sounds: Normal heart sounds.  Pulmonary:     Effort: Pulmonary effort is normal.     Breath sounds: Normal breath sounds.  Abdominal:     General: Abdomen is flat.     Palpations: Abdomen is soft.  Musculoskeletal:        General: Normal range of motion.     Cervical back: Normal range of motion and neck supple.  Skin:    General: Skin is warm.     Capillary Refill:  Capillary refill takes less than 2 seconds.  Neurological:     General: No focal deficit present.     Mental Status: He is oriented to person, place, and time.  Psychiatric:        Mood and Affect: Mood normal.        Behavior: Behavior normal.     ED Results / Procedures / Treatments   Labs (all labs ordered are listed, but only abnormal results are displayed)  Labs Reviewed  COMPREHENSIVE METABOLIC PANEL - Abnormal; Notable for the following components:      Result Value   Glucose, Bld 176 (*)    BUN 30 (*)    Creatinine, Ser 1.70 (*)    Calcium 8.8 (*)    Albumin 2.9 (*)    GFR, Estimated 38 (*)    All other components within normal limits  CBC - Abnormal; Notable for the following components:   Hemoglobin 12.9 (*)    All other components within normal limits  URINALYSIS, ROUTINE W REFLEX MICROSCOPIC - Abnormal; Notable for the following components:   APPearance TURBID (*)    Glucose, UA 50 (*)    Hgb urine dipstick MODERATE (*)    Protein, ur 100 (*)    Leukocytes,Ua LARGE (*)    Bacteria, UA FEW (*)    All other components within normal limits  I-STAT CHEM 8, ED - Abnormal; Notable for the following components:   BUN 32 (*)    Creatinine, Ser 1.90 (*)    Glucose, Bld 174 (*)    All other components within normal limits  URINE CULTURE  ETHANOL  I-STAT CG4 LACTIC ACID, ED  TROPONIN I (HIGH SENSITIVITY)  TROPONIN I (HIGH SENSITIVITY)    EKG EKG Interpretation Date/Time:  Sunday July 22 2023 19:33:30 EST Ventricular Rate:  63 PR Interval:  64 QRS Duration:  115 QT Interval:  395 QTC Calculation: 405 R Axis:   -52  Text Interpretation: Sinus rhythm Short PR interval Incomplete left bundle branch block No significant change since last tracing Confirmed by Richardean Canal [phone]) on 07/22/2023 7:38:15 PM  Radiology DG Pelvis Portable  Result Date: 07/22/2023 CLINICAL DATA:  Level 2 trauma, fall EXAM: PORTABLE PELVIS 1-2 VIEWS COMPARISON:  None Available.  FINDINGS: No fracture or dislocation is seen. Bilateral hip joint spaces are preserved. Visualized bony pelvis appears intact. IMPRESSION: Negative. Electronically Signed   By: Charline Bills M.D.   On: 07/22/2023 20:10   DG Chest Port 1 View  Result Date: 07/22/2023 CLINICAL DATA:  Level 2 trauma, fall EXAM: PORTABLE CHEST 1 VIEW COMPARISON:  05/24/2023 FINDINGS: Bilateral lower lobe scarring. No focal consolidation. No pleural effusion or pneumothorax. The heart is normal in size.  Thoracic aortic atherosclerosis. IMPRESSION: No acute cardiopulmonary disease. Electronically Signed   By: Charline Bills M.D.   On: 07/22/2023 20:09   CT Renal Stone Study  Result Date: 07/22/2023 CLINICAL DATA:  Level 2 trauma, fall, on blood thinners, abdominal pain EXAM: CT ABDOMEN AND PELVIS WITHOUT CONTRAST TECHNIQUE: Multidetector CT imaging of the abdomen and pelvis was performed following the standard protocol without IV contrast. RADIATION DOSE REDUCTION: This exam was performed according to the departmental dose-optimization program which includes automated exposure control, adjustment of the mA and/or kV according to patient size and/or use of iterative reconstruction technique. COMPARISON:  01/01/2022 FINDINGS: Lower chest: Mild subpleural reticulation/fibrosis in the lung bases. Hepatobiliary: Unenhanced liver is unremarkable. Gallbladder is unremarkable. No intrahepatic or extrahepatic duct dilatation. Pancreas: Within normal limits, noting mild parenchymal atrophy. Spleen: Within normal limits. Adrenals/Urinary Tract: Adrenal glands within normal limits. Bilateral renal cysts, including a dominant 3.2 cm left renal sinus cyst (series 3/image 42), benign (Bosniak I). No follow-up recommended. 3 mm nonobstructing interpolar right renal calculus (series 3/image 39). No ureteral or bladder calculi. No hydronephrosis. Thick-walled, trabeculated bladder (series 3/image 75), suggesting sequela of chronic bladder  obstruction versus cystitis. Stomach/Bowel: Stomach is within normal limits. No evidence of bowel obstruction. Appendix  is not discretely visualized. Extensive left colonic diverticulosis, without evidence of diverticulitis. Vascular/Lymphatic: No evidence of abdominal aortic aneurysm. Atherosclerotic calcifications of the abdominal aorta and branch vessels. No suspicious abdominopelvic lymphadenopathy. Reproductive: Prostate is unremarkable. Other: No abdominopelvic ascites. Postsurgical changes in the right inguinal region. Tiny fat containing left inguinal hernia. Musculoskeletal: Mild degenerative changes of the visualized thoracolumbar spine. IMPRESSION: No CT findings to account for the patient's abdominal pain. 3 mm nonobstructing interpolar right renal calculus. No ureteral or bladder calculi. No hydronephrosis. Extensive left colonic diverticulosis, without evidence of diverticulitis. Thick-walled, trabeculated bladder, suggesting sequela of chronic bladder outlet obstruction versus cystitis. Electronically Signed   By: Charline Bills M.D.   On: 07/22/2023 20:08   CT HEAD WO CONTRAST  Result Date: 07/22/2023 CLINICAL DATA:  Level 2 trauma, fall, on blood thinners EXAM: CT HEAD WITHOUT CONTRAST CT CERVICAL SPINE WITHOUT CONTRAST TECHNIQUE: Multidetector CT imaging of the head and cervical spine was performed following the standard protocol without intravenous contrast. Multiplanar CT image reconstructions of the cervical spine were also generated. RADIATION DOSE REDUCTION: This exam was performed according to the departmental dose-optimization program which includes automated exposure control, adjustment of the mA and/or kV according to patient size and/or use of iterative reconstruction technique. COMPARISON:  MRI brain dated 07/02/2023. FINDINGS: CT HEAD FINDINGS Brain: No evidence of acute infarction, hemorrhage, hydrocephalus, extra-axial collection or mass lesion/mass effect. Subcortical white  matter and periventricular small vessel ischemic changes. Old left basal ganglia lacunar infarct. Vascular: Mild intracranial atherosclerosis. Skull: Normal. Negative for fracture or focal lesion. Sinuses/Orbits: The visualized paranasal sinuses are essentially clear. The mastoid air cells are unopacified. Other: None. CT CERVICAL SPINE FINDINGS Alignment: Normal cervical lordosis. Skull base and vertebrae: No acute fracture. No primary bone lesion or focal pathologic process. Soft tissues and spinal canal: No prevertebral fluid or swelling. No visible canal hematoma. Disc levels: Moderate degenerative changes of the lower cervical spine. Spinal canal is patent. Upper chest: Lung apices are clear. Other: Visualized thyroid is unremarkable. IMPRESSION: No acute intracranial abnormality. Small vessel ischemic changes. Old left basal ganglia lacunar infarct. No traumatic injury to the cervical spine. Moderate degenerative changes. Electronically Signed   By: Charline Bills M.D.   On: 07/22/2023 20:04   CT CERVICAL SPINE WO CONTRAST  Result Date: 07/22/2023 CLINICAL DATA:  Level 2 trauma, fall, on blood thinners EXAM: CT HEAD WITHOUT CONTRAST CT CERVICAL SPINE WITHOUT CONTRAST TECHNIQUE: Multidetector CT imaging of the head and cervical spine was performed following the standard protocol without intravenous contrast. Multiplanar CT image reconstructions of the cervical spine were also generated. RADIATION DOSE REDUCTION: This exam was performed according to the departmental dose-optimization program which includes automated exposure control, adjustment of the mA and/or kV according to patient size and/or use of iterative reconstruction technique. COMPARISON:  MRI brain dated 07/02/2023. FINDINGS: CT HEAD FINDINGS Brain: No evidence of acute infarction, hemorrhage, hydrocephalus, extra-axial collection or mass lesion/mass effect. Subcortical white matter and periventricular small vessel ischemic changes. Old left  basal ganglia lacunar infarct. Vascular: Mild intracranial atherosclerosis. Skull: Normal. Negative for fracture or focal lesion. Sinuses/Orbits: The visualized paranasal sinuses are essentially clear. The mastoid air cells are unopacified. Other: None. CT CERVICAL SPINE FINDINGS Alignment: Normal cervical lordosis. Skull base and vertebrae: No acute fracture. No primary bone lesion or focal pathologic process. Soft tissues and spinal canal: No prevertebral fluid or swelling. No visible canal hematoma. Disc levels: Moderate degenerative changes of the lower cervical spine. Spinal canal is patent. Upper chest:  Lung apices are clear. Other: Visualized thyroid is unremarkable. IMPRESSION: No acute intracranial abnormality. Small vessel ischemic changes. Old left basal ganglia lacunar infarct. No traumatic injury to the cervical spine. Moderate degenerative changes. Electronically Signed   By: Charline Bills M.D.   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  Patient has a mechanical fall and hit his head.  Patient is on Plavix.  Level 2 trauma activated. Will get CT head/neck, labs, UA.   11:06 PM Reviewed patient's labs and independently interpreted imaging studies.  Labs are unremarkable.  UA showed yeast and also greater than 50 white blood cell count.  Patient's previous urine culture showed VRE.  I discussed case with pharmacy they recommend linezolid.  Patient was also given Diflucan.  At this point  patient is stable for discharge.  Of note, patient has mild balanitis on exam and patient states that he usually needs nystatin cream for it.  Problems Addressed: Acute cystitis without hematuria: acute illness or injury Balanitis: acute illness or injury Fall, initial encounter: acute illness or injury  Amount and/or Complexity of Data Reviewed Labs: ordered. Decision-making details documented in ED Course. Radiology: ordered and independent interpretation performed. Decision-making details documented in ED Course.  Risk Prescription drug management.    Final Clinical Impression(s) / ED Diagnoses Final diagnoses:  None    Rx / DC Orders ED Discharge Orders     None         Charlynne Pander, MD 07/22/23 2308

## 2023-07-22 NOTE — ED Notes (Signed)
Daughter will make calls to find transport home

## 2023-07-22 NOTE — ED Notes (Addendum)
Called daughter Lurena Joiner, who lives in [place]. She states he is unable to walk, uses WC at baseline. [facility] called, and they do not transport to [facility] addresses.

## 2023-07-22 NOTE — Discharge Instructions (Addendum)
You have urinary tract infection.  I have prescribed Zyvox 600 mg twice daily for 7 days  You also have balanitis and I prescribed nystatin cream twice daily  Fall precautions  See your doctor for follow-up  Return to ER if you have another fall, trouble urinating or abdominal pain or vomiting

## 2023-07-22 NOTE — ED Notes (Signed)
Groin area red and inflamed. Penis tip red, inflamed and bleeding. MD Silverio Lay at bedside

## 2023-07-22 NOTE — ED Triage Notes (Signed)
From home unwitnessed call from weakness. Hit head on right side. On Plavix. Hx frequent uti, stroke  Bp- 112/46 Hr - 72 96%  198 cbg

## 2023-07-23 DIAGNOSIS — Z7401 Bed confinement status: Secondary | ICD-10-CM | POA: Diagnosis not present

## 2023-07-23 DIAGNOSIS — R531 Weakness: Secondary | ICD-10-CM | POA: Diagnosis not present

## 2023-07-23 NOTE — ED Notes (Signed)
Patient transported to CT 

## 2023-07-24 DIAGNOSIS — R2681 Unsteadiness on feet: Secondary | ICD-10-CM | POA: Diagnosis not present

## 2023-07-24 DIAGNOSIS — R2689 Other abnormalities of gait and mobility: Secondary | ICD-10-CM | POA: Diagnosis not present

## 2023-07-24 DIAGNOSIS — Z9181 History of falling: Secondary | ICD-10-CM | POA: Diagnosis not present

## 2023-07-24 DIAGNOSIS — M6281 Muscle weakness (generalized): Secondary | ICD-10-CM | POA: Diagnosis not present

## 2023-07-24 LAB — URINE CULTURE: Culture: NO GROWTH

## 2023-07-25 DIAGNOSIS — N390 Urinary tract infection, site not specified: Secondary | ICD-10-CM | POA: Diagnosis not present

## 2023-07-25 DIAGNOSIS — I69321 Dysphasia following cerebral infarction: Secondary | ICD-10-CM | POA: Diagnosis not present

## 2023-07-25 DIAGNOSIS — R2689 Other abnormalities of gait and mobility: Secondary | ICD-10-CM | POA: Diagnosis not present

## 2023-07-25 DIAGNOSIS — I69398 Other sequelae of cerebral infarction: Secondary | ICD-10-CM | POA: Diagnosis not present

## 2023-07-25 DIAGNOSIS — I69391 Dysphagia following cerebral infarction: Secondary | ICD-10-CM | POA: Diagnosis not present

## 2023-07-25 DIAGNOSIS — R2681 Unsteadiness on feet: Secondary | ICD-10-CM | POA: Diagnosis not present

## 2023-07-25 DIAGNOSIS — N200 Calculus of kidney: Secondary | ICD-10-CM | POA: Diagnosis not present

## 2023-07-25 DIAGNOSIS — Z9181 History of falling: Secondary | ICD-10-CM | POA: Diagnosis not present

## 2023-07-25 DIAGNOSIS — N189 Chronic kidney disease, unspecified: Secondary | ICD-10-CM | POA: Diagnosis not present

## 2023-07-25 DIAGNOSIS — N1832 Chronic kidney disease, stage 3b: Secondary | ICD-10-CM | POA: Diagnosis not present

## 2023-07-25 DIAGNOSIS — R490 Dysphonia: Secondary | ICD-10-CM | POA: Diagnosis not present

## 2023-07-25 DIAGNOSIS — E1122 Type 2 diabetes mellitus with diabetic chronic kidney disease: Secondary | ICD-10-CM | POA: Diagnosis not present

## 2023-07-25 DIAGNOSIS — R8290 Unspecified abnormal findings in urine: Secondary | ICD-10-CM | POA: Diagnosis not present

## 2023-07-25 DIAGNOSIS — R600 Localized edema: Secondary | ICD-10-CM | POA: Diagnosis not present

## 2023-07-26 DIAGNOSIS — I69391 Dysphagia following cerebral infarction: Secondary | ICD-10-CM | POA: Diagnosis not present

## 2023-07-26 DIAGNOSIS — M6281 Muscle weakness (generalized): Secondary | ICD-10-CM | POA: Diagnosis not present

## 2023-07-26 DIAGNOSIS — R490 Dysphonia: Secondary | ICD-10-CM | POA: Diagnosis not present

## 2023-07-26 DIAGNOSIS — I69398 Other sequelae of cerebral infarction: Secondary | ICD-10-CM | POA: Diagnosis not present

## 2023-07-26 DIAGNOSIS — I69321 Dysphasia following cerebral infarction: Secondary | ICD-10-CM | POA: Diagnosis not present

## 2023-07-27 DIAGNOSIS — G2581 Restless legs syndrome: Secondary | ICD-10-CM | POA: Diagnosis not present

## 2023-07-27 DIAGNOSIS — I69351 Hemiplegia and hemiparesis following cerebral infarction affecting right dominant side: Secondary | ICD-10-CM | POA: Diagnosis not present

## 2023-07-27 DIAGNOSIS — E1140 Type 2 diabetes mellitus with diabetic neuropathy, unspecified: Secondary | ICD-10-CM | POA: Diagnosis not present

## 2023-07-31 DIAGNOSIS — R2689 Other abnormalities of gait and mobility: Secondary | ICD-10-CM | POA: Diagnosis not present

## 2023-07-31 DIAGNOSIS — Z9181 History of falling: Secondary | ICD-10-CM | POA: Diagnosis not present

## 2023-07-31 DIAGNOSIS — R2681 Unsteadiness on feet: Secondary | ICD-10-CM | POA: Diagnosis not present

## 2023-07-31 DIAGNOSIS — M6281 Muscle weakness (generalized): Secondary | ICD-10-CM | POA: Diagnosis not present

## 2023-08-01 DIAGNOSIS — E1122 Type 2 diabetes mellitus with diabetic chronic kidney disease: Secondary | ICD-10-CM | POA: Diagnosis not present

## 2023-08-01 DIAGNOSIS — N189 Chronic kidney disease, unspecified: Secondary | ICD-10-CM | POA: Diagnosis not present

## 2023-08-01 DIAGNOSIS — N390 Urinary tract infection, site not specified: Secondary | ICD-10-CM | POA: Diagnosis not present

## 2023-08-01 DIAGNOSIS — R490 Dysphonia: Secondary | ICD-10-CM | POA: Diagnosis not present

## 2023-08-01 DIAGNOSIS — E1142 Type 2 diabetes mellitus with diabetic polyneuropathy: Secondary | ICD-10-CM | POA: Diagnosis not present

## 2023-08-01 DIAGNOSIS — I69391 Dysphagia following cerebral infarction: Secondary | ICD-10-CM | POA: Diagnosis not present

## 2023-08-01 DIAGNOSIS — R600 Localized edema: Secondary | ICD-10-CM | POA: Diagnosis not present

## 2023-08-01 DIAGNOSIS — I69321 Dysphasia following cerebral infarction: Secondary | ICD-10-CM | POA: Diagnosis not present

## 2023-08-01 DIAGNOSIS — I69398 Other sequelae of cerebral infarction: Secondary | ICD-10-CM | POA: Diagnosis not present

## 2023-08-01 DIAGNOSIS — N200 Calculus of kidney: Secondary | ICD-10-CM | POA: Diagnosis not present

## 2023-08-01 DIAGNOSIS — R8290 Unspecified abnormal findings in urine: Secondary | ICD-10-CM | POA: Diagnosis not present

## 2023-08-01 DIAGNOSIS — N1832 Chronic kidney disease, stage 3b: Secondary | ICD-10-CM | POA: Diagnosis not present

## 2023-08-02 DIAGNOSIS — M6281 Muscle weakness (generalized): Secondary | ICD-10-CM | POA: Diagnosis not present

## 2023-08-02 DIAGNOSIS — I69391 Dysphagia following cerebral infarction: Secondary | ICD-10-CM | POA: Diagnosis not present

## 2023-08-02 DIAGNOSIS — I69398 Other sequelae of cerebral infarction: Secondary | ICD-10-CM | POA: Diagnosis not present

## 2023-08-02 DIAGNOSIS — I69321 Dysphasia following cerebral infarction: Secondary | ICD-10-CM | POA: Diagnosis not present

## 2023-08-02 DIAGNOSIS — R490 Dysphonia: Secondary | ICD-10-CM | POA: Diagnosis not present

## 2023-08-06 NOTE — Progress Notes (Signed)
Part of psych clearance labs

## 2023-08-07 DIAGNOSIS — M6281 Muscle weakness (generalized): Secondary | ICD-10-CM | POA: Diagnosis not present

## 2023-08-07 DIAGNOSIS — G4733 Obstructive sleep apnea (adult) (pediatric): Secondary | ICD-10-CM | POA: Diagnosis not present

## 2023-08-08 ENCOUNTER — Telehealth: Payer: Self-pay

## 2023-08-08 ENCOUNTER — Ambulatory Visit: Payer: Medicare PPO

## 2023-08-08 DIAGNOSIS — I69391 Dysphagia following cerebral infarction: Secondary | ICD-10-CM | POA: Diagnosis not present

## 2023-08-08 DIAGNOSIS — R2681 Unsteadiness on feet: Secondary | ICD-10-CM | POA: Diagnosis not present

## 2023-08-08 DIAGNOSIS — G4733 Obstructive sleep apnea (adult) (pediatric): Secondary | ICD-10-CM | POA: Diagnosis not present

## 2023-08-08 DIAGNOSIS — R2689 Other abnormalities of gait and mobility: Secondary | ICD-10-CM | POA: Diagnosis not present

## 2023-08-08 DIAGNOSIS — R490 Dysphonia: Secondary | ICD-10-CM | POA: Diagnosis not present

## 2023-08-08 DIAGNOSIS — Z9181 History of falling: Secondary | ICD-10-CM | POA: Diagnosis not present

## 2023-08-08 DIAGNOSIS — I69398 Other sequelae of cerebral infarction: Secondary | ICD-10-CM | POA: Diagnosis not present

## 2023-08-08 DIAGNOSIS — I69321 Dysphasia following cerebral infarction: Secondary | ICD-10-CM | POA: Diagnosis not present

## 2023-08-08 NOTE — Progress Notes (Signed)
Pt needed to be rescheduled

## 2023-08-08 NOTE — Telephone Encounter (Signed)
Transition Care Management Unsuccessful Follow-up Telephone Call  Date of discharge and from where:  [facility] 11/4  Attempts:  1st Attempt  Reason for unsuccessful TCM follow-up call:  No answer/busy   Lenard Forth [place]  [facility], [facility] Guide, Phone: [phone] Website: [email or web address]

## 2023-08-09 ENCOUNTER — Telehealth: Payer: Self-pay

## 2023-08-09 DIAGNOSIS — I69398 Other sequelae of cerebral infarction: Secondary | ICD-10-CM | POA: Diagnosis not present

## 2023-08-09 DIAGNOSIS — I69321 Dysphasia following cerebral infarction: Secondary | ICD-10-CM | POA: Diagnosis not present

## 2023-08-09 DIAGNOSIS — M6281 Muscle weakness (generalized): Secondary | ICD-10-CM | POA: Diagnosis not present

## 2023-08-09 DIAGNOSIS — R490 Dysphonia: Secondary | ICD-10-CM | POA: Diagnosis not present

## 2023-08-09 DIAGNOSIS — I69391 Dysphagia following cerebral infarction: Secondary | ICD-10-CM | POA: Diagnosis not present

## 2023-08-09 NOTE — Telephone Encounter (Signed)
Transition Care Management Unsuccessful Follow-up Telephone Call  Date of discharge and from where:  [facility] 11[facility]  Attempts:  2nd Attempt  Reason for unsuccessful TCM follow-up call:  No answer/busy   Lenard Forth [place]  [facility], [facility] Guide, Phone: [phone] Website: [email or web address]

## 2023-08-10 ENCOUNTER — Other Ambulatory Visit: Payer: Self-pay

## 2023-08-10 ENCOUNTER — Inpatient Hospital Stay
Admission: EM | Admit: 2023-08-10 | Discharge: 2023-08-13 | DRG: 683 | Disposition: A | Discharge status: Home Health | Payer: Medicare PPO | Source: Skilled Nursing Facility | Attending: Internal Medicine | Admitting: Internal Medicine

## 2023-08-10 ENCOUNTER — Encounter: Payer: Self-pay | Admitting: Internal Medicine

## 2023-08-10 ENCOUNTER — Emergency Department: Payer: Medicare PPO

## 2023-08-10 ENCOUNTER — Ambulatory Visit: Payer: Medicare PPO | Admitting: Physician Assistant

## 2023-08-10 DIAGNOSIS — Z801 Family history of malignant neoplasm of trachea, bronchus and lung: Secondary | ICD-10-CM

## 2023-08-10 DIAGNOSIS — R0989 Other specified symptoms and signs involving the circulatory and respiratory systems: Secondary | ICD-10-CM | POA: Diagnosis not present

## 2023-08-10 DIAGNOSIS — N1832 Chronic kidney disease, stage 3b: Secondary | ICD-10-CM | POA: Diagnosis present

## 2023-08-10 DIAGNOSIS — A0472 Enterocolitis due to Clostridium difficile, not specified as recurrent: Secondary | ICD-10-CM | POA: Diagnosis not present

## 2023-08-10 DIAGNOSIS — R531 Weakness: Secondary | ICD-10-CM | POA: Diagnosis not present

## 2023-08-10 DIAGNOSIS — Z1621 Resistance to vancomycin: Secondary | ICD-10-CM | POA: Diagnosis present

## 2023-08-10 DIAGNOSIS — E119 Type 2 diabetes mellitus without complications: Secondary | ICD-10-CM

## 2023-08-10 DIAGNOSIS — E039 Hypothyroidism, unspecified: Secondary | ICD-10-CM | POA: Diagnosis not present

## 2023-08-10 DIAGNOSIS — N189 Chronic kidney disease, unspecified: Secondary | ICD-10-CM | POA: Diagnosis not present

## 2023-08-10 DIAGNOSIS — N3090 Cystitis, unspecified without hematuria: Secondary | ICD-10-CM | POA: Diagnosis present

## 2023-08-10 DIAGNOSIS — M7989 Other specified soft tissue disorders: Secondary | ICD-10-CM | POA: Diagnosis not present

## 2023-08-10 DIAGNOSIS — Z89422 Acquired absence of other left toe(s): Secondary | ICD-10-CM

## 2023-08-10 DIAGNOSIS — Z7951 Long term (current) use of inhaled steroids: Secondary | ICD-10-CM | POA: Diagnosis not present

## 2023-08-10 DIAGNOSIS — E785 Hyperlipidemia, unspecified: Secondary | ICD-10-CM | POA: Diagnosis present

## 2023-08-10 DIAGNOSIS — Z79899 Other long term (current) drug therapy: Secondary | ICD-10-CM

## 2023-08-10 DIAGNOSIS — N3000 Acute cystitis without hematuria: Secondary | ICD-10-CM | POA: Diagnosis not present

## 2023-08-10 DIAGNOSIS — A491 Streptococcal infection, unspecified site: Secondary | ICD-10-CM | POA: Diagnosis present

## 2023-08-10 DIAGNOSIS — Z794 Long term (current) use of insulin: Secondary | ICD-10-CM | POA: Diagnosis not present

## 2023-08-10 DIAGNOSIS — I129 Hypertensive chronic kidney disease with stage 1 through stage 4 chronic kidney disease, or unspecified chronic kidney disease: Secondary | ICD-10-CM | POA: Diagnosis present

## 2023-08-10 DIAGNOSIS — Z7985 Long-term (current) use of injectable non-insulin antidiabetic drugs: Secondary | ICD-10-CM

## 2023-08-10 DIAGNOSIS — N401 Enlarged prostate with lower urinary tract symptoms: Secondary | ICD-10-CM | POA: Diagnosis present

## 2023-08-10 DIAGNOSIS — Z7902 Long term (current) use of antithrombotics/antiplatelets: Secondary | ICD-10-CM

## 2023-08-10 DIAGNOSIS — G2581 Restless legs syndrome: Secondary | ICD-10-CM | POA: Diagnosis present

## 2023-08-10 DIAGNOSIS — E860 Dehydration: Secondary | ICD-10-CM | POA: Diagnosis present

## 2023-08-10 DIAGNOSIS — Z811 Family history of alcohol abuse and dependence: Secondary | ICD-10-CM

## 2023-08-10 DIAGNOSIS — R0902 Hypoxemia: Secondary | ICD-10-CM | POA: Diagnosis not present

## 2023-08-10 DIAGNOSIS — E1140 Type 2 diabetes mellitus with diabetic neuropathy, unspecified: Secondary | ICD-10-CM | POA: Diagnosis present

## 2023-08-10 DIAGNOSIS — Z823 Family history of stroke: Secondary | ICD-10-CM

## 2023-08-10 DIAGNOSIS — K219 Gastro-esophageal reflux disease without esophagitis: Secondary | ICD-10-CM | POA: Diagnosis present

## 2023-08-10 DIAGNOSIS — G4733 Obstructive sleep apnea (adult) (pediatric): Secondary | ICD-10-CM | POA: Diagnosis present

## 2023-08-10 DIAGNOSIS — E1122 Type 2 diabetes mellitus with diabetic chronic kidney disease: Secondary | ICD-10-CM | POA: Diagnosis not present

## 2023-08-10 DIAGNOSIS — Z841 Family history of disorders of kidney and ureter: Secondary | ICD-10-CM

## 2023-08-10 DIAGNOSIS — Z833 Family history of diabetes mellitus: Secondary | ICD-10-CM | POA: Diagnosis not present

## 2023-08-10 DIAGNOSIS — N3281 Overactive bladder: Secondary | ICD-10-CM | POA: Diagnosis present

## 2023-08-10 DIAGNOSIS — Z8673 Personal history of transient ischemic attack (TIA), and cerebral infarction without residual deficits: Secondary | ICD-10-CM | POA: Diagnosis not present

## 2023-08-10 DIAGNOSIS — Z79890 Hormone replacement therapy (postmenopausal): Secondary | ICD-10-CM | POA: Diagnosis not present

## 2023-08-10 DIAGNOSIS — N179 Acute kidney failure, unspecified: Principal | ICD-10-CM | POA: Diagnosis present

## 2023-08-10 DIAGNOSIS — Z8619 Personal history of other infectious and parasitic diseases: Secondary | ICD-10-CM | POA: Diagnosis present

## 2023-08-10 DIAGNOSIS — Z85828 Personal history of other malignant neoplasm of skin: Secondary | ICD-10-CM | POA: Diagnosis not present

## 2023-08-10 DIAGNOSIS — E1165 Type 2 diabetes mellitus with hyperglycemia: Secondary | ICD-10-CM | POA: Diagnosis not present

## 2023-08-10 DIAGNOSIS — R42 Dizziness and giddiness: Secondary | ICD-10-CM | POA: Diagnosis not present

## 2023-08-10 DIAGNOSIS — G4489 Other headache syndrome: Secondary | ICD-10-CM | POA: Diagnosis not present

## 2023-08-10 LAB — URINALYSIS, W/ REFLEX TO CULTURE (INFECTION SUSPECTED)
Bacteria, UA: NONE SEEN
Bilirubin Urine: NEGATIVE
Glucose, UA: NEGATIVE mg/dL
Ketones, ur: NEGATIVE mg/dL
Nitrite: NEGATIVE
Protein, ur: 100 mg/dL — AB
Specific Gravity, Urine: 1.012 (ref 1.005–1.030)
Squamous Epithelial / HPF: 0 /[HPF] (ref 0–5)
WBC, UA: >50 WBC/hpf (ref 0–5)
pH: 5 (ref 5.0–8.0)

## 2023-08-10 LAB — COMPREHENSIVE METABOLIC PANEL
ALT: 18 U/L (ref 0–44)
AST: 42 U/L — ABNORMAL HIGH (ref 15–41)
Albumin: 3.6 g/dL (ref 3.5–5.0)
Alkaline Phosphatase: 47 U/L (ref 38–126)
Anion gap: 11 (ref 5–15)
BUN: 26 mg/dL — ABNORMAL HIGH (ref 8–23)
CO2: 23 mmol/L (ref 22–32)
Calcium: 8.4 mg/dL — ABNORMAL LOW (ref 8.9–10.3)
Chloride: 101 mmol/L (ref 98–111)
Creatinine, Ser: 1.77 mg/dL — ABNORMAL HIGH (ref 0.61–1.24)
GFR, Estimated: 36 mL/min — ABNORMAL LOW (ref >60)
Glucose, Bld: 146 mg/dL — ABNORMAL HIGH (ref 70–99)
Potassium: 5 mmol/L (ref 3.5–5.1)
Sodium: 135 mmol/L (ref 135–145)
Total Bilirubin: 1.5 mg/dL — ABNORMAL HIGH (ref <1.2)
Total Protein: 7.9 g/dL (ref 6.5–8.1)

## 2023-08-10 LAB — CBC WITH DIFFERENTIAL/PLATELET
Abs Immature Granulocytes: 0.03 10*3/uL (ref 0.00–0.07)
Basophils Absolute: 0 10*3/uL (ref 0.0–0.1)
Basophils Relative: 0 %
Eosinophils Absolute: 0.2 10*3/uL (ref 0.0–0.5)
Eosinophils Relative: 1 %
HCT: 43.9 % (ref 39.0–52.0)
Hemoglobin: 14.4 g/dL (ref 13.0–17.0)
Immature Granulocytes: 0 %
Lymphocytes Relative: 14 %
Lymphs Abs: 1.6 10*3/uL (ref 0.7–4.0)
MCH: 30.2 pg (ref 26.0–34.0)
MCHC: 32.8 g/dL (ref 30.0–36.0)
MCV: 92 fL (ref 80.0–100.0)
Monocytes Absolute: 1.2 10*3/uL — ABNORMAL HIGH (ref 0.1–1.0)
Monocytes Relative: 11 %
Neutro Abs: 8 10*3/uL — ABNORMAL HIGH (ref 1.7–7.7)
Neutrophils Relative %: 74 %
Platelets: 182 10*3/uL (ref 150–400)
RBC: 4.77 MIL/uL (ref 4.22–5.81)
RDW: 13.7 % (ref 11.5–15.5)
WBC: 11 10*3/uL — ABNORMAL HIGH (ref 4.0–10.5)
nRBC: 0 % (ref 0.0–0.2)

## 2023-08-10 LAB — GLUCOSE, CAPILLARY: Glucose-Capillary: 152 mg/dL — ABNORMAL HIGH (ref 70–99)

## 2023-08-10 LAB — LACTIC ACID, PLASMA: Lactic Acid, Venous: 1.4 mmol/L (ref 0.5–1.9)

## 2023-08-10 LAB — CBG MONITORING, ED: Glucose-Capillary: 182 mg/dL — ABNORMAL HIGH (ref 70–99)

## 2023-08-10 MED ORDER — SODIUM CHLORIDE 0.9 % IV SOLN: INTRAVENOUS | Status: DC

## 2023-08-10 MED ORDER — HYDRALAZINE HCL 20 MG/ML IJ SOLN: 5.0000 mg | Freq: Four times a day (QID) | INTRAMUSCULAR | Status: DC | PRN

## 2023-08-10 MED ORDER — INSULIN ASPART 100 UNIT/ML IJ SOLN
0.0000 [IU] | Freq: Three times a day (TID) | INTRAMUSCULAR | Status: DC
  Administered 2023-08-10: 3 [IU] via SUBCUTANEOUS
  Administered 2023-08-11: 2 [IU] via SUBCUTANEOUS
  Administered 2023-08-11: 3 [IU] via SUBCUTANEOUS
  Administered 2023-08-12: 2 [IU] via SUBCUTANEOUS
  Administered 2023-08-12: 5 [IU] via SUBCUTANEOUS
  Administered 2023-08-13 (×2): 2 [IU] via SUBCUTANEOUS
  Filled 2023-08-10 (×6): qty 1

## 2023-08-10 MED ORDER — FLUTICASONE PROPIONATE HFA 110 MCG/ACT IN AERO: 1.0000 | INHALATION_SPRAY | Freq: Two times a day (BID) | RESPIRATORY_TRACT | Status: DC

## 2023-08-10 MED ORDER — ONDANSETRON HCL 4 MG PO TABS: 4.0000 mg | ORAL_TABLET | Freq: Four times a day (QID) | ORAL | Status: DC | PRN

## 2023-08-10 MED ORDER — TIMOLOL MALEATE 0.5 % OP SOLN
1.0000 [drp] | Freq: Two times a day (BID) | OPHTHALMIC | Status: DC
  Administered 2023-08-11 – 2023-08-13 (×5): 1 [drp] via OPHTHALMIC
  Filled 2023-08-10 (×2): qty 5

## 2023-08-10 MED ORDER — BRIMONIDINE TARTRATE-TIMOLOL 0.2-0.5 % OP SOLN: 1.0000 [drp] | Freq: Two times a day (BID) | OPHTHALMIC | Status: DC

## 2023-08-10 MED ORDER — CLOPIDOGREL BISULFATE 75 MG PO TABS
75.0000 mg | ORAL_TABLET | Freq: Every day | ORAL | Status: DC
  Administered 2023-08-10 – 2023-08-13 (×4): 75 mg via ORAL
  Filled 2023-08-10 (×4): qty 1

## 2023-08-10 MED ORDER — INSULIN ASPART 100 UNIT/ML IJ SOLN
0.0000 [IU] | Freq: Every day | INTRAMUSCULAR | Status: DC
  Filled 2023-08-10: qty 1

## 2023-08-10 MED ORDER — GABAPENTIN 300 MG PO CAPS: 600.0000 mg | ORAL_CAPSULE | Freq: Three times a day (TID) | ORAL | Status: DC

## 2023-08-10 MED ORDER — MUPIROCIN 2 % EX OINT
1.0000 | TOPICAL_OINTMENT | Freq: Two times a day (BID) | CUTANEOUS | Status: DC
  Administered 2023-08-12 – 2023-08-13 (×3): 1 via NASAL
  Filled 2023-08-10 (×2): qty 22

## 2023-08-10 MED ORDER — FAMOTIDINE 20 MG PO TABS
20.0000 mg | ORAL_TABLET | Freq: Every day | ORAL | Status: DC
  Administered 2023-08-10 – 2023-08-12 (×3): 20 mg via ORAL
  Filled 2023-08-10 (×3): qty 1

## 2023-08-10 MED ORDER — NYSTATIN 100000 UNIT/ML MT SUSP
5.0000 mL | Freq: Four times a day (QID) | OROMUCOSAL | Status: DC
  Administered 2023-08-10 – 2023-08-13 (×10): 500000 [IU] via ORAL
  Filled 2023-08-10 (×11): qty 5

## 2023-08-10 MED ORDER — GABAPENTIN 400 MG PO CAPS
400.0000 mg | ORAL_CAPSULE | Freq: Three times a day (TID) | ORAL | Status: DC
  Administered 2023-08-10: 400 mg via ORAL
  Filled 2023-08-10 (×2): qty 1

## 2023-08-10 MED ORDER — BRIMONIDINE TARTRATE 0.2 % OP SOLN
1.0000 [drp] | Freq: Two times a day (BID) | OPHTHALMIC | Status: DC
  Administered 2023-08-11 – 2023-08-13 (×5): 1 [drp] via OPHTHALMIC
  Filled 2023-08-10 (×2): qty 5

## 2023-08-10 MED ORDER — GABAPENTIN 300 MG PO CAPS
600.0000 mg | ORAL_CAPSULE | ORAL | Status: AC
  Administered 2023-08-10: 600 mg via ORAL
  Filled 2023-08-10: qty 2

## 2023-08-10 MED ORDER — RISAQUAD PO CAPS
2.0000 | ORAL_CAPSULE | Freq: Three times a day (TID) | ORAL | Status: DC
  Administered 2023-08-10 – 2023-08-13 (×7): 2 via ORAL
  Filled 2023-08-10 (×9): qty 2

## 2023-08-10 MED ORDER — TAMSULOSIN HCL 0.4 MG PO CAPS
0.4000 mg | ORAL_CAPSULE | Freq: Every day | ORAL | Status: DC
  Administered 2023-08-10 – 2023-08-12 (×3): 0.4 mg via ORAL
  Filled 2023-08-10 (×3): qty 1

## 2023-08-10 MED ORDER — PRAMIPEXOLE DIHYDROCHLORIDE 0.25 MG PO TABS
0.1250 mg | ORAL_TABLET | Freq: Every day | ORAL | Status: DC
  Administered 2023-08-10 – 2023-08-12 (×3): 0.125 mg via ORAL
  Filled 2023-08-10 (×2): qty 1
  Filled 2023-08-10: qty 0.5
  Filled 2023-08-10: qty 1

## 2023-08-10 MED ORDER — DULOXETINE HCL 30 MG PO CPEP
60.0000 mg | ORAL_CAPSULE | Freq: Every day | ORAL | Status: DC
  Administered 2023-08-10 – 2023-08-12 (×3): 60 mg via ORAL
  Filled 2023-08-10 (×3): qty 2

## 2023-08-10 MED ORDER — SODIUM CHLORIDE (HYPERTONIC) 5 % OP SOLN: 1.0000 [drp] | OPHTHALMIC | Status: DC | PRN

## 2023-08-10 MED ORDER — HEPARIN SODIUM (PORCINE) 5000 UNIT/ML IJ SOLN
5000.0000 [IU] | Freq: Two times a day (BID) | INTRAMUSCULAR | Status: DC
  Administered 2023-08-10 – 2023-08-13 (×6): 5000 [IU] via SUBCUTANEOUS
  Filled 2023-08-10 (×6): qty 1

## 2023-08-10 MED ORDER — LEVOTHYROXINE SODIUM 50 MCG PO TABS
50.0000 ug | ORAL_TABLET | Freq: Every day | ORAL | Status: DC
  Administered 2023-08-11 – 2023-08-13 (×3): 50 ug via ORAL
  Filled 2023-08-10 (×3): qty 1

## 2023-08-10 MED ORDER — ONDANSETRON HCL 4 MG/2ML IJ SOLN: 4.0000 mg | Freq: Four times a day (QID) | INTRAMUSCULAR | Status: DC | PRN

## 2023-08-10 MED ORDER — MIRABEGRON ER 25 MG PO TB24
25.0000 mg | ORAL_TABLET | Freq: Every day | ORAL | Status: DC
  Administered 2023-08-11 – 2023-08-13 (×3): 25 mg via ORAL
  Filled 2023-08-10 (×3): qty 1

## 2023-08-10 MED ORDER — BUDESONIDE 0.25 MG/2ML IN SUSP: 0.2500 mg | Freq: Two times a day (BID) | RESPIRATORY_TRACT | Status: DC

## 2023-08-10 MED ORDER — ROSUVASTATIN CALCIUM 10 MG PO TABS
40.0000 mg | ORAL_TABLET | Freq: Every day | ORAL | Status: DC
  Administered 2023-08-10 – 2023-08-12 (×3): 40 mg via ORAL
  Filled 2023-08-10 (×3): qty 4
  Filled 2023-08-10: qty 2

## 2023-08-10 MED ORDER — SODIUM CHLORIDE 0.9 % IV BOLUS (SEPSIS)
1000.0000 mL | Freq: Once | INTRAVENOUS | Status: AC
  Administered 2023-08-10: 1000 mL via INTRAVENOUS

## 2023-08-10 NOTE — ED Triage Notes (Signed)
Pt arrives via ems from [facility], pt was in the shower where he became increasingly weak and was assisted by his aide down to the floor, no injury, pt needed substantial assistance when ems arrived, states that pt also uses an electric wheel chair, pt just completed antibiotics for a uti

## 2023-08-10 NOTE — ED Provider Notes (Signed)
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  This morning in the shower he felt so weak that he had to be helped to the floor to prevent falling.  Denies any acute pain, no head injury or loss of consciousness.  Reviewing outside records patient was recently placed on linezolid for VRE UTI from culture 06/22/2023.          Physical Exam   Triage Vital Signs: ED Triage Vitals  Encounter Vitals Group     BP      Systolic BP Percentile      Diastolic BP Percentile      Pulse      Resp      Temp      Temp src      SpO2      Weight      Height      Head Circumference      Peak Flow      Pain Score      Pain Loc      Pain Education      Exclude from Growth Chart     Most recent vital signs: Vitals:   08/10/23 1100 08/10/23 1200  BP: 120/76 (!) 143/79  Pulse: 82 86  Resp: 16 18  Temp:    SpO2: 96% 100%    General: Awake, no distress.  CV:  Good peripheral perfusion.  Regular rate and rhythm Resp:  Normal effort.  Clear to auscultation bilaterally Abd:  No distention.  Soft with suprapubic tenderness, palpably enlarged bladder Other:  Dry oral mucosa.  1 cm superficial soft tissue ulceration on the left great toe, no necrosis or crepitus or purulent drainage.   ED Results / Procedures / Treatments   Labs (all labs ordered are listed, but only abnormal results are displayed) Labs Reviewed  COMPREHENSIVE METABOLIC PANEL - Abnormal; Notable for the following components:      Result Value   Glucose, Bld 146 (*)    BUN 26 (*)    Creatinine, Ser 1.77 (*)    Calcium 8.4 (*)    AST 42  (*)    Total Bilirubin 1.5 (*)    GFR, Estimated 36 (*)    All other components within normal limits  CBC WITH DIFFERENTIAL/PLATELET - Abnormal; Notable for the following components:   WBC 11.0 (*)    Neutro Abs 8.0 (*)    Monocytes Absolute 1.2 (*)    All other components within normal limits  URINALYSIS, W/ REFLEX TO CULTURE (INFECTION SUSPECTED) - Abnormal; Notable for the following components:   Color, Urine YELLOW (*)    APPearance TURBID (*)    Hgb urine dipstick SMALL (*)    Protein, ur 100 (*)    Leukocytes,Ua LARGE (*)    All other components within normal limits  CULTURE, BLOOD (ROUTINE X 2)  CULTURE, BLOOD (ROUTINE X 2)  URINE CULTURE  LACTIC ACID, PLASMA  LACTIC ACID, PLASMA     EKG EKG performed at 10:37 AM interpreted by me at 10:40 AM Sinus rhythm rate of 82, left axis, normal intervals.  Poor R wave progression.  No acute ischemic changes.   RADIOLOGY Chest x-ray interpreted by me, unremarkable.  Radiology report reviewed.  X-ray left foot unremarkable   PROCEDURES:  Procedures   MEDICATIONS ORDERED IN ED: Medications  sodium chloride 0.9 % bolus 1,000 mL (0 mLs Intravenous Stopped 08/10/23 1231)  gabapentin (NEURONTIN) capsule 600 mg (600 mg Oral Given 08/10/23 1126)     IMPRESSION / MDM / ASSESSMENT AND PLAN / ED COURSE  I reviewed the triage vital signs and the nursing notes.  DDx: Dehydration, AKI, electrolyte abnormality, UTI, urinary retention, pneumonia  Patient's presentation is most consistent with acute presentation with potential threat to life or bodily function.  Patient presents with generalized weakness, worsening over the last several days with loss of appetite and poor oral intake.  Vital signs are unremarkable.  He is ill-appearing but not septic.  Labs show CKD at baseline.  He does have evidence of a UTI, and had 400 mL in his bladder so Foley catheter was inserted.  CBC and lactate unremarkable.   x-ray of the left foot does  not show any evidence of osteomyelitis.  Case discussed with hospitalist for further management of severely symptomatic UTI which is recurrent/outpatient treatment failure.Marland Kitchen      FINAL CLINICAL IMPRESSION(S) / ED DIAGNOSES   Final diagnoses:  Generalized weakness  Cystitis  Type 2 diabetes mellitus without complication, with long-term current use of insulin (HCC)  Stage 3b chronic kidney disease (HCC)     Rx / DC Orders   ED Discharge Orders     None        Note:  This document was prepared using Dragon voice recognition software and may include unintentional dictation errors.   Sharman Cheek, MD 08/10/23 1501

## 2023-08-10 NOTE — H&P (Signed)
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  Patient was diagnosed with VRE UTI sometime last week and started on Zyvox 7 days ago.  Since then patient has developed frequent nauseous vomiting and watery diarrhea up to 3-4 times a day with full smell.  Gradually patient has been getting weaker and weaker and today he fell during a shower.  He denies any abdominal pain and no fever or chills.  His last dosage of Suboxone was yesterday.  Denies any dysuria urinary frequency  ED Course: Afebrile, nontachycardic nonhypotensive nonhypoxic.  Blood work showed WBC 11.0, hemoglobin 14, creatinine 1.7.  UA showed WBC> 50, large leukocyte nitrate negative for nitrite.  Review of Systems: As per HPI otherwise 14 point review of systems negative.    Past Medical History:  Diagnosis Date   Anemia    Basal cell carcinoma 2018   R temple    Basal cell carcinoma of nose 2002   bridge of nose   BPH with obstruction/lower urinary tract symptoms    failed flomax, uroxatral, myrbetriq - sees urology Patsi Sears) pending videourodynamics   CKD (chronic kidney disease) stage 3, GFR 30-59 ml/min (HCC) 03/11/2012   Renal US 02/2014 - multiple kidney cysts    CVA (cerebral infarction) 03/07/2012   slurred speech; right hemplegia, left handed -  completed PT 07/2014 (17 sessions)   Dehydration    Depression    mild, on cymbalta per prior PCP   Erectile dysfunction    per urology (prostaglandin injection)   GERD (gastroesophageal reflux disease)    with sliding HH   H/O hiatal hernia    History of kidney problems 12/2010   lacerated left kidney after fall   HLD (hyperlipidemia)    Hx of basal cell carcinoma 07/10/2017   R. lat. forehead near hair line   Kidney cysts 02/2014   bilateral (renal US)   Migraines 03/11/2012   now rare   OSA on CPAP    sleep study 01/2014 - severe, CPAP at 10, previously on nuvigil (Dr. Gareth Morgan)   Palpitations 01/2012   holter WNL, rare PAC/PVCs   Presbycusis of both ears 2016   rec amplification [facility])   Restless leg syndrome    well controlled on mirapex   Rosacea    Gwen Pounds   TIA (transient ischemic attack)    Type 2 diabetes, controlled, with neuropathy (HCC) 2011   Dr Sharl Ma in [facility]    Past Surgical History:  Procedure Laterality Date   ABI  09/18/2005   WNL   AMPUTATION TOE Left 03/02/2023   Procedure: 81191 - TOE INTERPHALANGEAL JOINT - SECOND;  Surgeon: Rosetta Posner, DPM;  Location: [facility] ORS;  Service: Podiatry;  Laterality: Left;   CARDIAC CATHETERIZATION  09/18/2002   normal; [place]   CARDIOVASCULAR STRESS TEST  02/16/2014   WNL, low risk scan, EF 68%   CAROTID ENDARTERECTOMY  pt denies 02/27/23   CATARACT EXTRACTION W/ INTRAOCULAR LENS IMPLANT  11//2012 - 08/2011   COLONOSCOPY  03/19/2011   poor prep, unable to biopsy polyp in tic fold, rpt 3 mo Pearlean Brownie)   COLONOSCOPY  06/19/2011   mult polyps adenomatous, severe diverticulosis, rpt 3 yrs Pearlean Brownie)   CYSTOSCOPY WITH URETHRAL DILATATION N/A 07/24/2013   Procedure: CYSTOSCOPY WITH URETHRAL DILATATION;  Surgeon: Kathi Ludwig, MD;  Location: [facility] ORS;  Service: Urology;  Laterality: N/A;   ESOPHAGOGASTRODUODENOSCOPY  09/18/2004   duodenitis, medual HH, Grade 2 reflux, mild gastritis    ESOPHAGOGASTRODUODENOSCOPY  07/20/2011   mild chronic gastritis   HEMORRHOID SURGERY  09/18/1989   INGUINAL HERNIA REPAIR  1981; 2002   left, then repaired   PROSTATE SURGERY  02/16/2009   PVP (laser)   SKIN CANCER EXCISION  09/18/2000   bridge of nose, BCC   TONSILLECTOMY AND ADENOIDECTOMY  ~ 1945   TRANSURETHRAL INCISION OF PROSTATE N/A 07/24/2013   Procedure: TRANSURETHRAL INCISION OF THE PROSTATE (TUIP);  Surgeon: Kathi Ludwig, MD;  Location: [facility] ORS;  Service: Urology;  Laterality: N/A;   URETHRAL DILATION  09/18/2012   tannenbaum     reports that he has never smoked. He has never been exposed to tobacco smoke. He has never used smokeless tobacco. He reports that he does not currently use alcohol after a past usage of about 4.0 standard drinks of alcohol per week. He reports that he does not use drugs.  Allergies  Allergen Reactions   Dulaglutide     Other reaction(s): upset stomach Other reaction(s): Abdominal Pain, upset stomach Other reaction(s): upset stomach    Empagliflozin Other (See Comments)    Other reaction(s): yeast infection Other reaction(s): Other (See Comments) Other reaction(s): yeast infection    Lisinopril Cough    tachycardia   Nexium [Esomeprazole] Other (See Comments)    Headache   Oxycodone Other (See Comments)    Even 1/2 tablet of 5mg  caused increased confusion - contributory to hospitalization 10/2020 with AMS   Pregabalin Nausea Only and Other (See Comments)    Other reaction(s): MALAISE, SLIGHT NAUSEA, SLIGHT HEADACHE Other reaction(s): MALAISE, SLIGHT NAUSEA, SLIGHT HEADACHE    Rosuvastatin Other (See Comments)    Other reaction(s): muscle pain 2.16.2022 Pt is current on this medication without any issues. Other reaction(s): muscle pain 2.16.2022 Pt is current on this medication without any issues. Other reaction(s): muscle pain    Family History  Problem Relation Age of Onset   Stroke Father    Lung cancer Father 61         smoker   Alcoholism Sister    Kidney disease Sister    Stroke Brother    Diabetes Brother 28   Lung cancer Brother    Coronary artery disease Neg Hx    Sleep apnea Neg Hx      Prior to Admission medications   Medication Sig Start Date End Date Taking? Authorizing Provider  Alpha-Lipoic Acid 600 MG CAPS Take 1 capsule (600 mg total) by mouth daily. 06/28/21   Eustaquio Boyden, MD  brimonidine-timolol (COMBIGAN) 0.2-0.5 % ophthalmic solution Place 1 drop into both eyes every 12 (twelve) hours.    [provider]  clopidogrel (PLAVIX) 75 MG tablet TAKE 1 TABLET DAILY 04/18/23   Eustaquio Boyden, MD  Coenzyme Q10 (COQ10) 150 MG CAPS Take 1 capsule by mouth daily.    [provider]  DULoxetine (CYMBALTA) 60 MG capsule TAKE 1 CAPSULE DAILY Patient taking differently: Take  60 mg by mouth at bedtime. 08/29/22   Eustaquio Boyden, MD  famotidine (PEPCID) 20 MG tablet Take 1 tablet (20 mg total) by mouth at bedtime. 04/05/21   Kathlen Mody, MD  fluticasone (FLOVENT HFA) 110 MCG/ACT inhaler Inhale 1 puff into the lungs in the morning and at bedtime. 06/03/22   Eustaquio Boyden, MD  furosemide (LASIX) 20 MG tablet TAKE 1 TABLET(20 MG) BY MOUTH DAILY IN THE MORNING FLUID AS NEEDED FOR SWELLING 07/16/23   Eustaquio Boyden, MD  gabapentin (NEURONTIN) 300 MG capsule Take 2 capsules (600 mg total) by mouth 3 (three) times daily. 04/18/23   Eustaquio Boyden, MD  insulin aspart (NOVOLOG) 100 UNIT/ML injection CBG 70 - 120: 0 units  CBG 121 - 150: 1 unit  CBG 151 - 200: 2 units  CBG 201 - 250: 3 units  CBG 251 - 300: 5 units  CBG 301 - 350: 7 units  CBG 351 - 400: 9 units 04/05/21   Kathlen Mody, MD  insulin degludec (TRESIBA FLEXTOUCH) 200 UNIT/ML FlexTouch Pen Inject 90 Units into the skin at bedtime.    [provider]  Insulin Pen Needle (ULTICARE SHORT PEN NEEDLES) 31G X 8 MM MISC USE TO INJECT MEDICATION DAILY 12/18/22   Eustaquio Boyden, MD  levothyroxine (SYNTHROID)  50 MCG tablet Take 1 tablet (50 mcg total) by mouth daily before breakfast. Per endo Dr Sharl Ma 12/07/19   Eustaquio Boyden, MD  mupirocin ointment (BACTROBAN) 2 % Place 1 application. into the nose 2 (two) times daily. 01/03/22   Delfino Lovett, MD  nitroGLYCERIN (NITROSTAT) 0.4 MG SL tablet DISSOLVE 1 TABLET UNDER THE TONGUE EVERY 5 MINUTES AS NEEDED FOR CHEST PAIN 07/28/16   Eustaquio Boyden, MD  nystatin (MYCOSTATIN) 100000 UNIT/ML suspension Take 5 mLs (500,000 Units total) by mouth 4 (four) times daily. 03/06/23   Eustaquio Boyden, MD  nystatin cream (MYCOSTATIN) Apply 1 Application topically 2 (two) times daily. Apply to affected area 07/22/23   Charlynne Pander, MD  ondansetron (ZOFRAN) 4 MG tablet Take 1 tablet (4 mg total) by mouth daily as needed for nausea or vomiting. 05/24/23   Eustaquio Boyden, MD  polyethylene glycol (MIRALAX / GLYCOLAX) 17 g packet Take 17 g by mouth daily as needed. 04/05/21   Kathlen Mody, MD  pramipexole (MIRAPEX) 1 MG tablet TAKE 1 TABLET DAILY AT 4 P.M. 09/13/22   Eustaquio Boyden, MD  RABEprazole (ACIPHEX) 20 MG tablet Take 1 tablet (20 mg total) by mouth daily. 09/08/22   Eustaquio Boyden, MD  rosuvastatin (CRESTOR) 40 MG tablet TAKE 1 TABLET DAILY Patient taking differently: Take 40 mg by mouth at bedtime. 09/13/22   Eustaquio Boyden, MD  Semaglutide, 1 MG/DOSE, (OZEMPIC, 1 MG/DOSE,) 4 MG/3ML SOPN Inject 1 mg into the skin once a week. Saturday 01/06/22   Eustaquio Boyden, MD  sennosides-docusate sodium (SENOKOT-S) 8.6-50 MG tablet Take 1 tablet by mouth daily.    [provider]  sodium chloride (MURO 128) 5 % ophthalmic solution 1 drop as needed for eye irritation.    [provider]  tamsulosin (FLOMAX) 0.4 MG CAPS capsule TAKE 1 CAPSULE DAILY AFTER BREAKFAST 07/19/22   Eustaquio Boyden, MD  Vibegron (GEMTESA) 75 MG TABS Take 1 tablet (75 mg total) by mouth daily. 07/12/23   Stoioff, Verna Czech, MD  vitamin C (ASCORBIC ACID) 500 MG tablet Take  1,000 mg by mouth daily as needed.    [provider]    Physical Exam: Vitals:   08/10/23 1200 08/10/23  1300 08/10/23 1400 08/10/23 1500  BP: (!) 143/79 (!) 143/76 (!) 141/64 (!) 162/62  Pulse: 86 88 90 100  Resp: 18 18 16 13   Temp:    98.8 F (37.1 C)  TempSrc:    Oral  SpO2: 100% 98% 97% 98%  Weight:      Height:        Constitutional: NAD, calm, comfortable Vitals:   08/10/23 1200 08/10/23 1300 08/10/23 1400 08/10/23 1500  BP: (!) 143/79 (!) 143/76 (!) 141/64 (!) 162/62  Pulse: 86 88 90 100  Resp: 18 18 16 13   Temp:    98.8 F (37.1 C)  TempSrc:    Oral  SpO2: 100% 98% 97% 98%  Weight:      Height:       Eyes: PERRL, lids and conjunctivae normal ENMT: Mucous membranes are dry. Posterior pharynx clear of any exudate or lesions.Normal dentition.  Neck: normal, supple, no masses, no thyromegaly Respiratory: clear to auscultation bilaterally, no wheezing, no crackles. Normal respiratory effort. No accessory muscle use.  Cardiovascular: Regular rate and rhythm, no murmurs / rubs / gallops. No extremity edema. 2+ pedal pulses. No carotid bruits.  Abdomen: no tenderness, no masses palpated. No hepatosplenomegaly. Bowel sounds positive.  Musculoskeletal: no clubbing / cyanosis. No joint deformity upper and lower extremities. Good ROM, no contractures. Normal muscle tone.  Skin: no rashes, lesions, ulcers. No induration Neurologic: CN 2-12 grossly intact. Sensation intact, DTR normal. Strength 5/5 in all 4.  Psychiatric: Normal judgment and insight. Alert and oriented x 3. Normal mood.     Labs on Admission: I have personally reviewed following labs and imaging studies  CBC: Recent Labs  Lab 08/10/23 1058  WBC 11.0*  NEUTROABS 8.0*  HGB 14.4  HCT 43.9  MCV 92.0  PLT 182   Basic Metabolic Panel: Recent Labs  Lab 08/10/23 1058  NA 135  K 5.0  CL 101  CO2 23  GLUCOSE 146*  BUN 26*  CREATININE 1.77*  CALCIUM 8.4*   GFR: Estimated Creatinine  Clearance: 29.6 mL/min (A) (by C-G formula based on SCr of 1.77 mg/dL (H)). Liver Function Tests: Recent Labs  Lab 08/10/23 1058  AST 42*  ALT 18  ALKPHOS 47  BILITOT 1.5*  PROT 7.9  ALBUMIN 3.6   No results for input(s): "LIPASE", "AMYLASE" in the last 168 hours. No results for input(s): "AMMONIA" in the last 168 hours. Coagulation Profile: No results for input(s): "INR", "PROTIME" in the last 168 hours. Cardiac Enzymes: No results for input(s): "CKTOTAL", "CKMB", "CKMBINDEX", "TROPONINI" in the last 168 hours. BNP (last 3 results) Recent Labs    06/22/23 1320  PROBNP 33.0   HbA1C: No results for input(s): "HGBA1C" in the last 72 hours. CBG: Recent Labs  Lab 08/10/23 1647  GLUCAP 182*   Lipid Profile: No results for input(s): "CHOL", "HDL", "LDLCALC", "TRIG", "CHOLHDL", "LDLDIRECT" in the last 72 hours. Thyroid Function Tests: No results for input(s): "TSH", "T4TOTAL", "FREET4", "T3FREE", "THYROIDAB" in the last 72 hours. Anemia Panel: No results for input(s): "VITAMINB12", "FOLATE", "FERRITIN", "TIBC", "IRON", "RETICCTPCT" in the last 72 hours. Urine analysis:    Component Value Date/Time   COLORURINE YELLOW (A) 08/10/2023 1058   APPEARANCEUR TURBID (A) 08/10/2023 1058   APPEARANCEUR Cloudy (A) 07/12/2023 0905   LABSPEC 1.012 08/10/2023 1058   PHURINE 5.0 08/10/2023 1058   GLUCOSEU NEGATIVE 08/10/2023 1058   GLUCOSEU >=1000 (A) 10/17/2021 1509   HGBUR SMALL (A) 08/10/2023 1058   BILIRUBINUR NEGATIVE 08/10/2023 1058  BILIRUBINUR Negative 07/12/2023 0905   KETONESUR NEGATIVE 08/10/2023 1058   PROTEINUR 100 (A) 08/10/2023 1058   UROBILINOGEN 0.2 06/22/2023 1307   UROBILINOGEN 0.2 10/17/2021 1509   NITRITE NEGATIVE 08/10/2023 1058   LEUKOCYTESUR LARGE (A) 08/10/2023 1058    Radiological Exams on Admission: DG Foot 2 Views Left  Result Date: 08/10/2023 CLINICAL DATA:  Left great toe swelling and ulcer. EXAM: LEFT FOOT - 2 VIEW COMPARISON:  November 21, 2021.  FINDINGS: Status post surgical amputation of the second middle and distal phalanges. No fracture or dislocation is noted. Joint spaces are intact. No definite lytic destruction is noted currently. IMPRESSION: Postsurgical changes as noted above.  No acute abnormality seen. Electronically Signed   By: Lupita Raider M.D.   On: 08/10/2023 11:29   DG Chest Port 1 View  Result Date: 08/10/2023 CLINICAL DATA:  Lightheadedness and generalized weakness. EXAM: PORTABLE CHEST 1 VIEW COMPARISON:  07/22/2023 FINDINGS: The heart size and mediastinal contours are within normal limits. Low bilateral lung volumes. There is no evidence of pulmonary edema, consolidation, pneumothorax or pleural fluid. The visualized skeletal structures are unremarkable. IMPRESSION: Low lung volumes. No acute findings. Electronically Signed   By: Irish Lack M.D.   On: 08/10/2023 11:23    EKG: Independently reviewed.  Sinus rhythm, no acute ST changes  Assessment/Plan Principal Problem:   C. difficile colitis Active Problems:   VRE (vancomycin-resistant Enterococci) infection  (please populate well all problems here in Problem List. (For example, if patient is on BP meds at home and you resume or decide to hold them, it is a problem that needs to be her. Same for CAD, COPD, HLD and so on)  Diarrhea -Rule out C. difficile given that the patient has had 7-day course of Zyvox. -Other DDx, his GI symptoms may well be side effect from Zyvox but will try to rule out C. difficile first  VRE UTI -Completed 7 days course of Zyvox.  Discussed with on-call infectious disease Dr. Rutha Bouchard, recommended discontinue antibiotics.  Deconditioning and fall -No LOC no head or neck injury.  Patient was held sliding down slowly this morning.  No other events of fall at home according to significant other. -PT OT evaluation  Severe dehydration -Secondary to repeated nauseous vomiting and diarrhea -IV fluid  IDDM -SSI  Diabetic  neuropathy -Continue gabapentin  CKD stage IIIb -Clinically appears to be volume contracted, on IV fluid, hold off Lasix  OSA -Continue nocturnal oxygen   DVT prophylaxis: Lovenox Code Status: Full code Family Communication: Significant other at bedside Disposition Plan: Patient is sick with significant diarrhea deconditioning for and has to be worked up to rule out C. difficile on this admission, expect more than 2 midnight hospital stay Consults called: Curbside consult with infectious disease, reconsult if indicated. Admission status: Telemetry admission   Emeline General MD [facility] Pager [phone]  08/10/2023, 5:03 PM

## 2023-08-10 NOTE — ED Notes (Signed)
Pt SpO2 fluxuates between 82% on 98% while sleeping. Noted hx of OSA. Pt placed on 2L via N/C for sleeping.

## 2023-08-11 DIAGNOSIS — A0472 Enterocolitis due to Clostridium difficile, not specified as recurrent: Secondary | ICD-10-CM | POA: Diagnosis not present

## 2023-08-11 LAB — GLUCOSE, CAPILLARY
Glucose-Capillary: 112 mg/dL — ABNORMAL HIGH (ref 70–99)
Glucose-Capillary: 147 mg/dL — ABNORMAL HIGH (ref 70–99)
Glucose-Capillary: 165 mg/dL — ABNORMAL HIGH (ref 70–99)
Glucose-Capillary: 136 mg/dL — ABNORMAL HIGH (ref 70–99)

## 2023-08-11 LAB — BASIC METABOLIC PANEL
Anion gap: 7 (ref 5–15)
BUN: 22 mg/dL (ref 8–23)
CO2: 24 mmol/L (ref 22–32)
Calcium: 7.8 mg/dL — ABNORMAL LOW (ref 8.9–10.3)
Chloride: 107 mmol/L (ref 98–111)
Creatinine, Ser: 1.65 mg/dL — ABNORMAL HIGH (ref 0.61–1.24)
GFR, Estimated: 39 mL/min — ABNORMAL LOW (ref >60)
Glucose, Bld: 135 mg/dL — ABNORMAL HIGH (ref 70–99)
Potassium: 3.8 mmol/L (ref 3.5–5.1)
Sodium: 138 mmol/L (ref 135–145)

## 2023-08-11 LAB — CBC
HCT: 36.3 % — ABNORMAL LOW (ref 39.0–52.0)
Hemoglobin: 11.9 g/dL — ABNORMAL LOW (ref 13.0–17.0)
MCH: 30.2 pg (ref 26.0–34.0)
MCHC: 32.8 g/dL (ref 30.0–36.0)
MCV: 92.1 fL (ref 80.0–100.0)
Platelets: 174 10*3/uL (ref 150–400)
RBC: 3.94 MIL/uL — ABNORMAL LOW (ref 4.22–5.81)
RDW: 14 % (ref 11.5–15.5)
WBC: 8.3 10*3/uL (ref 4.0–10.5)
nRBC: 0 % (ref 0.0–0.2)

## 2023-08-11 LAB — URINE CULTURE: Culture: NO GROWTH

## 2023-08-11 MED ORDER — CHLORHEXIDINE GLUCONATE CLOTH 2 % EX PADS
6.0000 | MEDICATED_PAD | Freq: Every day | CUTANEOUS | Status: DC
  Administered 2023-08-11: 6 via TOPICAL

## 2023-08-11 MED ORDER — GABAPENTIN 300 MG PO CAPS
600.0000 mg | ORAL_CAPSULE | Freq: Three times a day (TID) | ORAL | Status: DC
  Administered 2023-08-11 – 2023-08-13 (×7): 600 mg via ORAL
  Filled 2023-08-11 (×7): qty 2

## 2023-08-11 NOTE — Plan of Care (Signed)
  Problem: Education: Goal: Ability to describe self-care measures that may prevent or decrease complications (Diabetes Survival Skills Education) will improve Outcome: Progressing Goal: Individualized Educational Video(s) Outcome: Progressing   Problem: Fluid Volume: Goal: Ability to maintain a balanced intake and output will improve Outcome: Progressing   Problem: Skin Integrity: Goal: Risk for impaired skin integrity will decrease Outcome: Progressing   Problem: Activity: Goal: Risk for activity intolerance will decrease Outcome: Progressing   Problem: Nutrition: Goal: Adequate nutrition will be maintained Outcome: Progressing   Problem: Elimination: Goal: Will not experience complications related to bowel motility Outcome: Progressing Goal: Will not experience complications related to urinary retention Outcome: Progressing

## 2023-08-11 NOTE — Evaluation (Signed)
Occupational Therapy Evaluation Patient Details Name: James NORWICK, MD MRN: [id number] DOB: 1931/08/10 Today's Date: 08/11/2023   History of Present Illness Pt admitted to [facility] on 08/10/23 for c/o worsening weakness, loss of appetite, and decreased PO intake. Recently dx with VRE UTI and started on Zyvox. Since then, pt endorses frequent nausea, vomiting, and diarrhea. 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 He comes to [facility] for [facility] [employer] He uses a power WC for mobility at the facility. He had been IND in Bed-WC-toilet transfers until recently, but reports that in the past 3 weeks he has experienced 5 falls while attempt to transfer into his WC. Pt has been receiving increased assistance from aides and therapists [facility]. Pt reports that he has not been using DME when transferring. Today we trialed using a RW with transfers and pt was able to perform more safely. He was also able to use the walker to perform 3x sit<>stand and to maintain standing balance for ~ 2 minutes after each trial. Pt required Max A for pericare post BM on BSC. Recommend ongoing OT while hospitalized, with increased hours for personal care assistants post-DC, to assist with safety during transfers, as well as continued OT and PT to build strength and endurance.     If plan is discharge home, recommend the following: A little help with walking and/or transfers;A little help with bathing/dressing/bathroom;Assistance with cooking/housework;Assist for transportation    Functional Status Assessment  Patient has had a recent decline in their functional status and demonstrates the ability to make significant improvements in function in a reasonable and predictable amount of time.  Equipment Recommendations  None  recommended by OT    Recommendations for Other Services       Precautions / Restrictions Precautions Precautions: Fall Restrictions Weight Bearing Restrictions: No Other Position/Activity Restrictions: SpO2 >/= 92%      Mobility Bed Mobility               General bed mobility comments: NT -- Pt received, left in recliner    Transfers Overall transfer level: Needs assistance Equipment used: Rolling walker (2 wheels) Transfers: Sit to/from Stand Sit to Stand: Min assist, Mod assist           General transfer comment: Mod A for coming into standing from seated position. Pt then able to step-pivot to recliner with Min A and RW      Balance Overall balance assessment: Needs assistance Sitting-balance support: No upper extremity supported, Feet supported Sitting balance-Leahy Scale: Good     Standing balance support: Reliant on assistive device for balance, Bilateral upper extremity supported, During functional activity Standing balance-Leahy Scale: Poor                             ADL either performed or assessed with clinical judgement   ADL Overall ADL's : Needs assistance/impaired Eating/Feeding: Modified independent                       Toilet Transfer: Minimal assistance   Toileting- Clothing Manipulation and Hygiene: Maximal assistance Toileting - Clothing Manipulation Details (indicate cue type and reason): max A for pericare in standing post BM             Vision  Perception         Praxis         Pertinent Vitals/Pain Pain Assessment Pain Assessment: 0-10 Pain Score: 3  Pain Location: abdomen Pain Descriptors / Indicators: Discomfort Pain Intervention(s): Repositioned, Monitored during session     Extremity/Trunk Assessment Upper Extremity Assessment Upper Extremity Assessment: Generalized weakness   Lower Extremity Assessment Lower Extremity Assessment: Generalized weakness        Communication Communication Communication: No apparent difficulties   Cognition Arousal: Alert Behavior During Therapy: WFL for tasks assessed/performed Overall Cognitive Status: Within Functional Limits for tasks assessed                                       General Comments  amputation to 2nd toe on L foot with healing incision. Ulcer to L big toe from neuropathy    Exercises     Shoulder Instructions      Home Living Family/patient expects to be discharged to:: Other (Comment) [facility] facility)                                 Additional Comments: [facility] ILF. Has WIS with tub bench and grab bars; standard height toilet with riser and grab bars. He has assistance from Aides 5x/wk to help with IADL's. Since sickness, they have been helping with ADL's.      Prior Functioning/Environment Prior Level of Function : Needs assist             Mobility Comments: Prior to sickness, pt was mod I with SPT to/from electric WC to the LEFT. He was active with PT 2x/wk where they worked on standing balance, strengthening, and ambulation with rollator. x5 fall prior to hospital admission due to weakness; states legs buckeled as he was getting assistance back to his WC from the shower. ADLs Comments: Prior to sickness, he was mod I with ADL's. Since sickness, he has required increased assist for bathing and toileting from Aide. Facility provides 3 meals/day in [employer]. He is IND with medication management.        OT Problem List: Decreased strength;Decreased activity tolerance;Impaired balance (sitting and/or standing)      OT Treatment/Interventions: Self-care/ADL training;Therapeutic exercise;Patient/family education;Balance training;Energy conservation;Therapeutic activities;Neuromuscular education;DME and/or AE instruction    OT Goals(Current goals can be found in the care plan section) Acute Rehab OT Goals Patient Stated Goal:  to go back to [facility] OT Goal Formulation: With patient Time For Goal Achievement: 08/25/23 Potential to Achieve Goals: Good ADL Goals Pt Will Transfer to Toilet: with contact guard assist;bedside commode Pt/caregiver will Perform Home Exercise Program: Increased strength;Increased ROM Additional ADL Goal #1: Pt will increase standing balance tolerance to 5+ minutes with RW and SUPV  OT Frequency: Min 1X/week    Co-evaluation              AM-PAC OT "6 Clicks" Daily Activity     Outcome Measure Help from another person eating meals?: None Help from another person taking care of personal grooming?: A Little Help from another person toileting, which includes using toliet, bedpan, or urinal?: A Lot Help from another person bathing (including washing, rinsing, drying)?: A Lot Help from another person to put on and taking off regular upper body clothing?: None Help from another person to put on and taking off  regular lower body clothing?: A Lot 6 Click Score: 17   End of Session Equipment Utilized During Treatment: Rolling walker (2 wheels)  Activity Tolerance: Patient tolerated treatment well Patient left: in chair;with call bell/phone within reach;with chair alarm set  OT Visit Diagnosis: Unsteadiness on feet (R26.81);Muscle weakness (generalized) (M62.81);History of falling (Z91.81)                Time: 1050-1120 OT Time Calculation (min): 30 min Charges:  OT General Charges $OT Visit: 1 Visit OT Evaluation $OT Eval Low Complexity: 1 Low OT Treatments $Self Care/Home Management : 23-37 mins Latina Craver, PhD, MS, OTR/L 08/11/23, 12:30 PM

## 2023-08-11 NOTE — Evaluation (Signed)
Physical Therapy Evaluation Patient Details Name: James RUMBAUGH, MD MRN: [id number] DOB: 12/28/1930 Today's Date: 08/11/2023  History of Present Illness  Pt admitted to [facility] on 08/10/23 for c/o worsening weakness, loss of appetite, and decreased PO intake. Recently dx with VRE UTI and started on Zyvox. Since then, pt endorses frequent nausea, vomiting, and diarrhea. Significant PMH includes: diabetes, CKD, hypertension, prior stroke, GERD, VRE UTI   Clinical Impression  Pt is a 89 year old M admitted to hospital on 08/10/23 for C.Diff Colitis. At baseline, pt is normally mod I for ADL's and stand pivot transfers to the LEFT to/from his electric WC. He is active with PT 2x/wk where they work on balance, strengthening, and gait. He has an aide that assists with IADLs and facility provides 3 meals/day. Since getting sick, pt has required increased assist from aide for ADL's and transfers.  Pt presents with BLE weakness, impaired sensation/neuropathy of BLE, impaired skin integrity, delayed processing, decreased balance, increased pain levels, and decreased activity tolerance, resulting in impaired functional mobility from baseline. Due to deficits, pt required min assist for bed mobility, mod assist-supervision for scooting, and CGA-mod assist for stand pivot transfer EOB>recliner>BSC. Increased assist with fatigue, concerning for IND with transfers at DC.  Deficits limit the pt's ability to safely and independently perform ADL's, transfer, and ambulate. Pt will benefit from acute skilled PT services to address deficits for return to baseline function. Pt will benefit from post acute therapy services, as well as diabetic shoes to prevent further skin breakdown.         If plan is discharge home, recommend the following: A little help with walking and/or transfers;A little help with bathing/dressing/bathroom   Equipment Recommendations None recommended by PT     Functional Status Assessment  Patient has had a recent decline in their functional status and demonstrates the ability to make significant improvements in function in a reasonable and predictable amount of time.     Precautions / Restrictions Precautions Precautions: Fall Restrictions Other Position/Activity Restrictions: SpO2 >/= 92%      Mobility  Bed Mobility               General bed mobility comments: min assist for trunk facilitation to sit EOB, HOB elevated, use of BUE for support. Increased time/effort to achieve EOB positioning.    Transfers                   General transfer comment: Mod assist for anterior scooting towards EOB to get out of hospital bed divot; then pt able to be supervision for safety with anterior scooting towards EOB and in recliner with BUE for support. CGA for safety for stand pivot transfer to the LEFT from EOB>recliner from elevated bed height. Mod assist for stand pivot transfer to the LEFT from EOB>BSC. Pt left in care of OT on BSC.        Balance Overall balance assessment: Needs assistance Sitting-balance support: No upper extremity supported, Feet supported Sitting balance-Leahy Scale: Good Sitting balance - Comments: kyphotic posture, no LOB with anterior scooting     Standing balance-Leahy Scale: Poor Standing balance comment: fluctuating assist ranging from CGA-mod assist for stand pivot transfer to recliner and BSC                             Pertinent Vitals/Pain Pain Assessment Pain Assessment: 0-10 Pain Score: 3  Pain Location: abdomen/trunk Pain Descriptors / Indicators:  Discomfort Pain Intervention(s): Monitored during session    Home Living Family/patient expects to be discharged to:: Other (Comment) ([facility])                   Additional Comments: [facility] IL[facility]. Has WIS with tub bench and grab bars; standard height toilet with riser and grab bars. He has assistance from Aides 5x/wk to help with IADL's.  Since sickness, they have been helping with ADL's.    Prior Function               Mobility Comments: Prior to sickness, pt was mod I with SPT to/from electric WC to the LEFT. He was active with PT 2x/wk where they worked on standing balance, strengthening, and ambulation with rollator. x1 fall prior to hospital admission due to weakness; states legs buckeled as he was getting assistance back to his WC from the shower. ADLs Comments: Prior to sickness, he was mod I with ADL's. Since sickness, he has required increased assist for bathing and toileting from Aide. Facility provides 3 meals/day in [employer]. He is IND with medication management.     Extremity/Trunk Assessment   Upper Extremity Assessment Upper Extremity Assessment: Defer to OT evaluation    Lower Extremity Assessment Lower Extremity Assessment: Generalized weakness (diminished sensation to bilateral feet from neuropathy)          Cognition Arousal: Alert Behavior During Therapy: WFL for tasks assessed/performed Overall Cognitive Status: Within Functional Limits for tasks assessed                                 General Comments: delayed processing; pt feels like he has brain fog        General Comments General comments (skin integrity, edema, etc.): amputation to 2nd toe on L foot with healing incision. Ulcer to L big toe from neuropathy    Exercises Other Exercises Other Exercises: Participates in bed mobility, transfers, and toileting. Left in care of OT on BSC. Other Exercises: Pt educated re: PT role/POC, DC recommendations, safety with mobility, energy conservation, OOB mobility to recliner.   Assessment/Plan    PT Assessment Patient needs continued PT services  PT Problem List Decreased strength;Decreased activity tolerance;Decreased balance;Decreased mobility;Decreased cognition;Impaired sensation;Pain;Decreased skin integrity       PT Treatment Interventions DME instruction;Gait  training;Functional mobility training;Therapeutic activities;Therapeutic exercise;Balance training;Neuromuscular re-education;Wheelchair mobility training    PT Goals (Current goals can be found in the Care Plan section)  Acute Rehab PT Goals Patient Stated Goal: "get back to walking" PT Goal Formulation: With patient Time For Goal Achievement: 08/25/23 Potential to Achieve Goals: Fair    Frequency Min 2X/week        AM-PAC PT "6 Clicks" Mobility  Outcome Measure Help needed turning from your back to your side while in a flat bed without using bedrails?: A Little Help needed moving from lying on your back to sitting on the side of a flat bed without using bedrails?: A Little Help needed moving to and from a bed to a chair (including a wheelchair)?: A Lot Help needed standing up from a chair using your arms (e.g., wheelchair or bedside chair)?: A Lot Help needed to walk in hospital room?: A Lot Help needed climbing 3-5 steps with a railing? : A Lot 6 Click Score: 14    End of Session Equipment Utilized During Treatment: Gait belt Activity Tolerance: Patient tolerated treatment well;Patient limited  by fatigue Patient left:  (left seated on BSC with OT) Nurse Communication: Mobility status PT Visit Diagnosis: Muscle weakness (generalized) (M62.81);Pain;Unsteadiness on feet (R26.81);History of falling (Z91.81) Pain - Right/Left:  (abdomen)    Time: 1610-9604 PT Time Calculation (min) (ACUTE ONLY): 42 min   Charges:   PT Evaluation $PT Eval Low Complexity: 1 Low PT Treatments $Therapeutic Activity: 8-22 mins PT General Charges $$ ACUTE PT VISIT: 1 Visit        Vira Blanco, PT, DPT 12:15 PM,08/11/23 Physical Therapist - [place] [facility]

## 2023-08-11 NOTE — Progress Notes (Signed)
[facility]  - [place] [facility]   PATIENT NAME: James Bell    MR#:  [id number]  DATE OF BIRTH:  04/01/1931  SUBJECTIVE:  patient sitting up in the recliner chair. No family at bedside. Tolerating PO diet. No diarrhea nausea vomiting since he is been in the room. Came in after he felt weak at his independent living facility while showering there with help of aid. Recently finished round of antibiotic for his UTI. Foley catheter was placed due to urine retention in the ED Reviewing outside records patient was recently placed on linezolid for VRE UTI from culture 06/22/2023.   VITALS:  Blood pressure (!) 136/57, pulse 88, temperature 97.9 F (36.6 C), temperature source Oral, resp. rate 16, height 5\' 8"  (1.727 m), weight 86.2 kg, SpO2 98%.  PHYSICAL EXAMINATION:   GENERAL:  89 y.o.-year-old patient with no acute distress. Weak LUNGS: Normal breath sounds bilaterally, no wheezing CARDIOVASCULAR: S1, S2 normal. No murmur   ABDOMEN: Soft, nontender, nondistended. Bowel sounds present. FOLEY+ EXTREMITIES: No  edema b/l.    NEUROLOGIC: nonfocal  patient is alert and awake   LABORATORY PANEL:  CBC Recent Labs  Lab 08/11/23 0426  WBC 8.3  HGB 11.9*  HCT 36.3*  PLT 174    Chemistries  Recent Labs  Lab 08/10/23 1058 08/11/23 0426  NA 135 138  K 5.0 3.8  CL 101 107  CO2 23 24  GLUCOSE 146* 135*  BUN 26* 22  CREATININE 1.77* 1.65*  CALCIUM 8.4* 7.8*  AST 42*  --   ALT 18  --   ALKPHOS 47  --   BILITOT 1.5*  --    Cardiac Enzymes No results for input(s): "TROPONINI" in the last 168 hours. RADIOLOGY:  DG Foot 2 Views Left  Result Date: 08/10/2023 CLINICAL DATA:  Left great toe swelling and ulcer. EXAM: LEFT FOOT - 2 VIEW COMPARISON:  November 21, 2021. FINDINGS: Status post surgical amputation of the second middle and distal phalanges. No fracture or dislocation is noted. Joint spaces are intact. No definite lytic destruction is noted currently.  IMPRESSION: Postsurgical changes as noted above.  No acute abnormality seen. Electronically Signed   By: James Bell Bell.   On: 08/10/2023 11:29   DG Chest Port 1 View  Result Date: 08/10/2023 CLINICAL DATA:  Lightheadedness and generalized weakness. EXAM: PORTABLE CHEST 1 VIEW COMPARISON:  07/22/2023 FINDINGS: The heart size and mediastinal contours are within normal limits. Low bilateral lung volumes. There is no evidence of pulmonary edema, consolidation, pneumothorax or pleural fluid. The visualized skeletal structures are unremarkable. IMPRESSION: Low lung volumes. No acute findings. Electronically Signed   By: James Bell Bell.   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   Patient was diagnosed with VRE UTI sometime last week and started on Zyvox 7 days ago.   No nausea vomiting or diarrhea since patient has been in the hospital.  Generalized weakness with diarrhea and nausea -- symptoms have resolved. -- Patient received IV fluids -- tolerating PO diet -- has not been able to give stool sample to rule out C. Diff  VRE UTI BPH/?overactive bladder Acute Urinary retention--s/p foley placement in ER --Completed 7 days  course of Zyvox. Dr. Chipper Bell discussed with on-call infectious disease Dr. Rutha Bell, recommended discontinue antibiotics.  --Cont flomax, Gemtesa --Pt follows with Urology Dr James Bell --will d/c foley in am  Deconditioning and fall -No LOC no head or neck injury.  Patient was held sliding down slowly this morning.  No other events of fall at home according to significant other. -PT OT evaluation-- recommends rehab however patient is not interested in rehab.  Social worker discussed and patient does seem to plan increase aide hours and continue PT OT at [facility] independent living   AK I/ dehydration in the setting of CKD stage IIIb -Secondary to repeated nauseous vomiting and diarrhea -received IV fluids. -- Labs stable -- hold Lasix   IDDM -SSI   Diabetic neuropathy -Continue gabapentin     Procedures: Family communication :dter James Bell Consults :none CODE STATUS: FULL DVT Prophylaxis : heparin Level of care: Telemetry Medical Status is: Inpatient Remains inpatient appropriate because weakness with dehydration.    TOTAL TIME TAKING CARE OF THIS PATIENT: 35 minutes.  >50% time spent on counselling and coordination of care  Note: This dictation was prepared with Dragon dictation along with smaller phrase technology. Any transcriptional errors that result from this process are unintentional.  Enedina Finner Bell    [facility]   CC: Primary care physician; James Boyden, MD

## 2023-08-11 NOTE — TOC Initial Note (Signed)
Transition of Care [facility]) - Initial/Assessment Note    Patient Details  Name: James ALDANA, MD MRN: [id number] Date of Birth: 23-May-1931  Transition of Care [facility]) CM/SW Contact:    Liliana Cline, LCSW Phone Number: 08/11/2023, 1:47 PM  Clinical Narrative:                 CSW spoke with patient. He confirmed he is from [facility] and plans to return there at discharge. Patient states he was receiving PT and OT at [facility] and plans to continue this - confirmed this with Chanel at [facility] who confirms this can continue through their in house agency [facility]. Patient states he has an aide he privately pays to help him at the ILF and plans to increase the hours per ILF/PT/OT recs - Chanel confirmed this is needed as they do not have nurses on staff and patient will need to be safe to return to his ILF apartment with his hired assistance. Chanel at [facility] reported they do not have to come assess the patient before he returns and would not need an FL2 since they are ILF level of care.  Expected Discharge Plan: Home w Home Health Services Barriers to Discharge: Continued Medical Work up   Patient Goals and CMS Choice Patient states their goals for this hospitalization and ongoing recovery are:: return to [facility] [employer].gov Compare Post Acute Care list provided to:: Patient Choice offered to / list presented to : Patient      Expected Discharge Plan and Services       Living arrangements for the past 2 months: Independent Living Facility                                      Prior Living Arrangements/Services Living arrangements for the past 2 months: Independent Living Facility Lives with:: Self Patient language and need for interpreter reviewed:: Yes Do you feel safe going back to the place where you live?: Yes      Need for Family Participation in Patient Care: Yes (Comment) Care giver support system in place?: Yes  (comment) Current home services: DME, Homehealth aide Criminal Activity/Legal Involvement Pertinent to Current Situation/Hospitalization: No - Comment as needed  Activities of Daily Living   ADL Screening (condition at time of admission) Independently performs ADLs?: No Does the patient have a NEW difficulty with bathing/dressing/toileting/self-feeding that is expected to last >3 days?: No Does the patient have a NEW difficulty with getting in/out of bed, walking, or climbing stairs that is expected to last >3 days?: No Does the patient have a NEW difficulty with communication that is expected to last >3 days?: No Is the patient deaf or have difficulty hearing?: No Does the patient have difficulty seeing, even when wearing glasses/contacts?: No Does the patient have difficulty concentrating, remembering, or making decisions?: No  Permission Sought/Granted Permission sought to share information with : Oceanographer granted to share information with : Yes, Verbal Permission Granted     Permission granted to share info w AGENCY: [facility]        Emotional Assessment       Orientation: : Oriented to Self, Oriented to Place, Oriented to  Time, Oriented to Situation Alcohol / Substance Use: Not Applicable Psych Involvement: No (comment)  Admission diagnosis:  Generalized weakness [R53.1] C. difficile colitis [A04.72] Cystitis [N30.90] Type 2  diabetes mellitus without complication, with long-term current use of insulin (HCC) [E11.9, Z79.4] Stage 3b chronic kidney disease (HCC) [N18.32] Patient Active Problem List   Diagnosis Date Noted   VRE (vancomycin-resistant Enterococci) infection 07/02/2023   Pedal edema 06/22/2023   Abnormal chest CT 05/25/2023   Diplopia 05/25/2023   History of partial ray amputation of second toe of left foot (HCC) 03/07/2023   Closed right ankle fracture 03/07/2023   White patches on oral mucosa 03/07/2023   Memory  deficit 07/31/2022   Diabetic ulcer of toe associated with type 2 diabetes mellitus, limited to breakdown of skin (HCC) 04/24/2022   Bradycardia    Insomnia 01/01/2022   Hypoglycemia 01/01/2022   Aspiration pneumonia (HCC) 11/23/2021   Hypomagnesemia 11/23/2021   C. difficile colitis    Postural dizziness with presyncope 10/25/2021   General weakness 05/12/2021   Frequent UTI 03/31/2021   Polypharmacy 03/10/2021   Acute encephalopathy 11/02/2020   UTI (urinary tract infection) 10/18/2020   Involuntary movements 04/25/2020   Recurrent productive cough 05/31/2019   Hx of basal cell carcinoma 07/10/2017   Rosacea    Gait disturbance, post-stroke 10/05/2015   Presbycusis of both ears    Impaired mobility 04/23/2015   Hearing loss of right ear due to cerumen impaction 03/23/2015   HTN (hypertension) 03/18/2015   Diastolic dysfunction 03/18/2015   Spastic hemiparesis affecting dominant side (HCC) 07/07/2014   Medicare annual wellness visit, subsequent 11/06/2013   Erectile dysfunction due to arterial insufficiency 02/05/2013   MDD (major depressive disorder), recurrent episode, moderate (HCC)    Palpitations 06/26/2012   Hyperlipidemia associated with type 2 diabetes mellitus (HCC)    Restless leg syndrome, severe    GERD (gastroesophageal reflux disease)    BPH with obstruction/lower urinary tract symptoms    History of CVA (cerebrovascular accident) 03/11/2012   Hemiparesis affecting right side as late effect of cerebrovascular accident (CVA) (HCC) 03/11/2012   Type 2 diabetes mellitus with hyperglycemia (HCC) 03/11/2012   OSA on CPAP 03/11/2012   CKD stage 3 due to type 2 diabetes mellitus (HCC) 03/11/2012   Migraines 03/11/2012   Thrombocytopenia (HCC) 2011   Fall with injury 2011   PCP:  Eustaquio Boyden, MD Pharmacy:   [facility] - [place], [place] - [address] [place] [address] Phone: [phone] Fax:  [phone]  [id number] - [place], [place] - [address] AT [facility] [address] [facility] [address] [place] [address] Phone: [phone] Fax: [phone]     Social Determinants of Health (SDOH) Social History: SDOH Screenings   Food Insecurity: No Food Insecurity (08/10/2023)  Housing: Low Risk  (08/10/2023)  Transportation Needs: No Transportation Needs (08/10/2023)  Utilities: Not At Risk (08/10/2023)  Depression (PHQ2-9): Low Risk  (05/10/2023)  Financial Resource Strain: Low Risk  (12/30/2020)  Tobacco Use: Low Risk  (08/10/2023)   SDOH Interventions:     Readmission Risk Interventions     No data to display

## 2023-08-11 NOTE — TOC CM/SW Note (Signed)
Patient from [facility]. PT and OT recommend continued therapy at [facility] at [facility]. Called [facility], spoke to [place] to inquire if they will need to come assess patient before he returns. He states he will have their Manager (Chanel) call CSW back.   Alfonso Ramus, LCSW Transitions of Care Department [phone]

## 2023-08-12 DIAGNOSIS — A0472 Enterocolitis due to Clostridium difficile, not specified as recurrent: Secondary | ICD-10-CM

## 2023-08-12 LAB — GLUCOSE, CAPILLARY
Glucose-Capillary: 122 mg/dL — ABNORMAL HIGH (ref 70–99)
Glucose-Capillary: 231 mg/dL — ABNORMAL HIGH (ref 70–99)
Glucose-Capillary: 118 mg/dL — ABNORMAL HIGH (ref 70–99)
Glucose-Capillary: 132 mg/dL — ABNORMAL HIGH (ref 70–99)

## 2023-08-12 NOTE — Progress Notes (Signed)
PROGRESS NOTE  James Sarks, MD NFA:[id number] DOB: 1931/06/13 DOA: 08/10/2023 PCP: Eustaquio Boyden, MD  HPI/Recap of past 24 hours: James Sarks, MD is a 89 y.o. male with medical history significant of BPH, recurrent UTI, CVA, IDDM2 with insulin resistance, CKD stage IIIb, hypothyroidism, diabetic neuropathy, OSA on nocturnal oxygen, sent by significant other for evaluation of persistent nauseous vomiting diarrhea weakness and fall.    Patient was diagnosed with VRE UTI sometime last week and started on Zyvox 7 days ago.  Per medical record case discussed with ID recommended no further antibiotics.  Diarrhea has improved, 1 solid bowel movement in the last 48 hours.  08/12/2023: The patient was seen and examined at his bedside.  Still with generalized weakness.  Plan to discharge with home health services PT/OT.  Plan for voiding trial today.  Assessment/Plan: Principal Problem:   C. difficile colitis Active Problems:   VRE (vancomycin-resistant Enterococci) infection  Generalized weakness with diarrhea and nausea Diarrhea and nausea have improved. Continue to encourage oral intake   VRE UTI, POA BPH/?overactive bladder Acute Urinary retention--s/p foley placement in ER --Completed 7 days course of Zyvox. Dr. Chipper Herb discussed with on-call infectious disease Dr. Rutha Bouchard, recommended discontinue antibiotics.  --Cont flomax, Gemtesa --Pt follows with Urology Dr Lonna Cobb Plan to DC Foley catheter today 08/12/2023.   Deconditioning and fall -No LOC no head or neck injury.  Patient was held sliding down slowly this morning.  No other events of fall at home according to significant other. -PT OT evaluation-- recommends rehab however patient is not interested in rehab. Social worker discussed and patient does seem to plan increase aide hours and continue PT OT at [facility] independent living Continue PT OT with fall precautions.   AKI/ dehydration in the setting of CKD  stage IIIb Improving -Secondary to repeated nauseous vomiting and diarrhea -received IV fluids. -- Labs stable -- hold Lasix Monitor urine output   IDDM2 -SSI Last hemoglobin A1c 6.7 on 02/27/2023.   Diabetic neuropathy -Continue gabapentin    Time:  65 minutes.     Procedures: None. Family communication:  None at bedside. Consults : None. CODE STATUS: FULL DVT Prophylaxis : heparin SQ twice daily Level of care: Telemetry Medical     Status is: Inpatient The patient requires at least 2 midnights for further evaluation and treatment of present condition.    Objective: Vitals:   08/11/23 0834 08/11/23 1954 08/12/23 0455 08/12/23 0823  BP: (!) 136/57 (!) 131/54 (!) 134/58 132/67  Pulse: 88 80 81 76  Resp: 16 18 18 16   Temp: 97.9 F (36.6 C) 99.1 F (37.3 C) 98.8 F (37.1 C) 97.7 F (36.5 C)  TempSrc: Oral Oral Oral Oral  SpO2: 98% 99% 99% 95%  Weight:      Height:        Intake/Output Summary (Last 24 hours) at 08/12/2023 1428 Last data filed at 08/12/2023 1318 Gross per 24 hour  Intake --  Output 501 ml  Net -501 ml   Filed Weights   08/10/23 1044  Weight: 86.2 kg    Exam:  General: 89 y.o. year-old male well developed well nourished in no acute distress.  Alert and oriented x3. Cardiovascular: Regular rate and rhythm with no rubs or gallops.  No thyromegaly or JVD noted.   Respiratory: Clear to auscultation with no wheezes or rales. Good inspiratory effort. Abdomen: Soft nontender nondistended with normal bowel sounds x4 quadrants. Musculoskeletal: No lower extremity edema. 2/4 pulses in  all 4 extremities. Skin: No ulcerative lesions noted or rashes, Psychiatry: Mood is appropriate for condition and setting   Data Reviewed: CBC: Recent Labs  Lab 08/10/23 1058 08/11/23 0426  WBC 11.0* 8.3  NEUTROABS 8.0*  --   HGB 14.4 11.9*  HCT 43.9 36.3*  MCV 92.0 92.1  PLT 182 174   Basic Metabolic Panel: Recent Labs  Lab 08/10/23 1058  08/11/23 0426  NA 135 138  K 5.0 3.8  CL 101 107  CO2 23 24  GLUCOSE 146* 135*  BUN 26* 22  CREATININE 1.77* 1.65*  CALCIUM 8.4* 7.8*   GFR: Estimated Creatinine Clearance: 31.8 mL/min (A) (by C-G formula based on SCr of 1.65 mg/dL (H)). Liver Function Tests: Recent Labs  Lab 08/10/23 1058  AST 42*  ALT 18  ALKPHOS 47  BILITOT 1.5*  PROT 7.9  ALBUMIN 3.6   No results for input(s): "LIPASE", "AMYLASE" in the last 168 hours. No results for input(s): "AMMONIA" in the last 168 hours. Coagulation Profile: No results for input(s): "INR", "PROTIME" in the last 168 hours. Cardiac Enzymes: No results for input(s): "CKTOTAL", "CKMB", "CKMBINDEX", "TROPONINI" in the last 168 hours. BNP (last 3 results) Recent Labs    06/22/23 1320  PROBNP 33.0   HbA1C: No results for input(s): "HGBA1C" in the last 72 hours. CBG: Recent Labs  Lab 08/11/23 1219 08/11/23 1607 08/11/23 2146 08/12/23 0817 08/12/23 1146  GLUCAP 147* 165* 136* 122* 231*   Lipid Profile: No results for input(s): "CHOL", "HDL", "LDLCALC", "TRIG", "CHOLHDL", "LDLDIRECT" in the last 72 hours. Thyroid Function Tests: No results for input(s): "TSH", "T4TOTAL", "FREET4", "T3FREE", "THYROIDAB" in the last 72 hours. Anemia Panel: No results for input(s): "VITAMINB12", "FOLATE", "FERRITIN", "TIBC", "IRON", "RETICCTPCT" in the last 72 hours. Urine analysis:    Component Value Date/Time   COLORURINE YELLOW (A) 08/10/2023 1058   APPEARANCEUR TURBID (A) 08/10/2023 1058   APPEARANCEUR Cloudy (A) 07/12/2023 0905   LABSPEC 1.012 08/10/2023 1058   PHURINE 5.0 08/10/2023 1058   GLUCOSEU NEGATIVE 08/10/2023 1058   GLUCOSEU >=1000 (A) 10/17/2021 1509   HGBUR SMALL (A) 08/10/2023 1058   BILIRUBINUR NEGATIVE 08/10/2023 1058   BILIRUBINUR Negative 07/12/2023 0905   KETONESUR NEGATIVE 08/10/2023 1058   PROTEINUR 100 (A) 08/10/2023 1058   UROBILINOGEN 0.2 06/22/2023 1307   UROBILINOGEN 0.2 10/17/2021 1509   NITRITE  NEGATIVE 08/10/2023 1058   LEUKOCYTESUR LARGE (A) 08/10/2023 1058   Sepsis Labs: @LABRCNTIP (procalcitonin:4,lacticidven:4)  ) Recent Results (from the past 240 hour(s))  Blood Culture (routine x 2)     Status: None (Preliminary result)   Collection Time: 08/10/23 10:58 AM   Specimen: BLOOD RIGHT HAND  Result Value Ref Range Status   Specimen Description BLOOD RIGHT HAND  Final   Special Requests   Final    BOTTLES DRAWN AEROBIC AND ANAEROBIC Blood Culture results may not be optimal due to an inadequate volume of blood received in culture bottles   Culture   Final    NO GROWTH 2 DAYS Performed at [facility], [address] [place], [place] [address]    Report Status PENDING  Incomplete  Blood Culture (routine x 2)     Status: None (Preliminary result)   Collection Time: 08/10/23 10:58 AM   Specimen: BLOOD  Result Value Ref Range Status   Specimen Description BLOOD LEFT ANTECUBITAL  Final   Special Requests   Final    BOTTLES DRAWN AEROBIC ONLY Blood Culture results may not be optimal due to an  inadequate volume of blood received in culture bottles   Culture   Final    NO GROWTH 2 DAYS Performed at [facility], [address] [place], [place] [address]    Report Status PENDING  Incomplete  Urine Culture     Status: None   Collection Time: 08/10/23 10:58 AM   Specimen: Urine, Random  Result Value Ref Range Status   Specimen Description   Final    URINE, RANDOM Performed at [facility], [address] [place], [place] [address]    Special Requests   Final    NONE Reflexed from [id number] Performed at [facility], [address], [place], [place] [address]    Culture   Final    NO GROWTH Performed at [facility], [address] [place], [place] [address]    Report Status 08/11/2023 FINAL  Final      Studies: No results found.  Scheduled Meds:  acidophilus  2 capsule Oral TID   brimonidine  1 drop Both Eyes Q12H    And   timolol  1 drop Both Eyes Q12H   clopidogrel  75 mg Oral Daily   DULoxetine  60 mg Oral QHS   famotidine  20 mg Oral QHS   gabapentin  600 mg Oral TID   heparin  5,000 Units Subcutaneous Q12H   insulin aspart  0-15 Units Subcutaneous TID WC   insulin aspart  0-5 Units Subcutaneous QHS   levothyroxine  50 mcg Oral QAC breakfast   mirabegron ER  25 mg Oral Daily   mupirocin ointment  1 Application Nasal BID   nystatin  5 mL Oral QID   pramipexole  0.125 mg Oral Q supper   rosuvastatin  40 mg Oral QHS   tamsulosin  0.4 mg Oral QPC supper    Continuous Infusions:   LOS: 2 days     Darlin Drop, MD [facility] Pager [phone]  If 7PM-7AM, please contact night-coverage [email or web address] Password [email or web address] 08/12/2023, 2:28 PM

## 2023-08-13 ENCOUNTER — Encounter: Payer: TRICARE For Life (TFL) | Admitting: Family Medicine

## 2023-08-13 DIAGNOSIS — A0472 Enterocolitis due to Clostridium difficile, not specified as recurrent: Secondary | ICD-10-CM | POA: Diagnosis not present

## 2023-08-13 LAB — GLUCOSE, CAPILLARY
Glucose-Capillary: 136 mg/dL — ABNORMAL HIGH (ref 70–99)
Glucose-Capillary: 148 mg/dL — ABNORMAL HIGH (ref 70–99)

## 2023-08-13 MED ORDER — RISAQUAD PO CAPS: 2.0000 | ORAL_CAPSULE | Freq: Three times a day (TID) | ORAL | 2 refills | Status: DC

## 2023-08-13 MED ORDER — MIRABEGRON ER 25 MG PO TB24: 25.0000 mg | ORAL_TABLET | Freq: Every day | ORAL | 0 refills | Status: DC

## 2023-08-13 MED ORDER — TAMSULOSIN HCL 0.4 MG PO CAPS: 0.4000 mg | ORAL_CAPSULE | Freq: Every day | ORAL | 0 refills | Status: DC

## 2023-08-13 MED ORDER — SENNA-DOCUSATE SODIUM 8.6-50 MG PO TABS: 1.0000 | ORAL_TABLET | Freq: Every day | ORAL | 0 refills | Status: DC | PRN

## 2023-08-13 NOTE — Plan of Care (Signed)
Problem: Metabolic: Goal: Ability to maintain appropriate glucose levels will improve Outcome: Progressing

## 2023-08-13 NOTE — TOC Transition Note (Signed)
Transition of Care [facility]) - CM/SW Discharge Note   Patient Details  Name: SRIVATSA MASHEK, MD MRN: [id number] Date of Birth: Jul 16, 1931  Transition of Care [facility]) CM/SW Contact:  Chapman Fitch, RN Phone Number: 08/13/2023, 12:30 PM   Clinical Narrative:     Resumption orders for PT and OT faxed to [facility] with [facility] Per MD patient's daughter to arrange transport at discharge     Barriers to Discharge: Continued Medical Work up   Patient Goals and CMS Choice CMS Medicare.gov Compare Post Acute Care list provided to:: Patient Choice offered to / list presented to : Patient  Discharge Placement                         Discharge Plan and Services Additional resources added to the After Visit Summary for                                       Social Determinants of Health (SDOH) Interventions SDOH Screenings   Food Insecurity: No Food Insecurity (08/10/2023)  Housing: Low Risk  (08/10/2023)  Transportation Needs: No Transportation Needs (08/10/2023)  Utilities: Not At Risk (08/10/2023)  Depression (PHQ2-9): Low Risk  (05/10/2023)  Financial Resource Strain: Low Risk  (12/30/2020)  Tobacco Use: Low Risk  (08/10/2023)     Readmission Risk Interventions     No data to display

## 2023-08-13 NOTE — Consult Note (Signed)
[facility] Liaison Note  08/13/2023  James Sarks, MD Feb 23, 1931 [id number]  Location: RN Hospital Liaison screened the patient remotely at [facility].  Insurance: [facility]   James Sarks, MD is a 89 y.o. male who is a Primary Care Patient of Eustaquio Boyden, MD The patient was screened for  readmission hospitalization with noted medium risk score for unplanned readmission risk with 1 IP/3 ED in 6 months.  The patient was assessed for potential Care Management service needs for post hospital transition for care coordination. Review of patient's electronic medical record reveals patient was admitted with C.Difficile Colitis. TOC documentation indicates pt will return to [facility] with [facility] arranged. No anticipated needs via VBCI at this time.   Plan: [facility] Liaison will continue to follow progress and disposition to asess for post hospital community care coordination/management needs.  Referral request for community care coordination: anticipate Transitions of Care Team follow up.   VBCI Care Management/Population Health does not replace or interfere with any arrangements made by the Inpatient Transition of Care team.   For questions contact:   Elliot Cousin, RN, [facility] Liaison [place]   [facility], Population Health Office Hours MTWF  8:00 am-6:00 pm Direct Dial: [phone] mobile [phone] [Office toll free line] Office Hours are M-F 8:30 - 5 pm [email or web address]

## 2023-08-13 NOTE — Discharge Summary (Signed)
Discharge Summary  James Sarks, MD UXL:[id number] DOB: 1931-09-10  PCP: Eustaquio Boyden, MD  Admit date: 08/10/2023 Discharge date: 08/13/2023  Time spent: 35 minutes.  Recommendations for Outpatient Follow-up:  Follow-up with your primary care provider. Take your medications as prescribed.  Discharge Diagnoses:  Active Hospital Problems   Diagnosis Date Noted   C. difficile colitis    VRE (vancomycin-resistant Enterococci) infection 07/02/2023    Resolved Hospital Problems  No resolved problems to display.    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    Patient was diagnosed with VRE UTI sometime last week and started on Zyvox 7 days ago.  Per medical record case discussed with ID recommended no further antibiotics.  Diarrhea has improved, 1 solid bowel movement in the last 48 hours.  08/13/2023: The patient was seen and examined at his bedside.  No new complaints.  He is tolerating a diet well.  Assessed by PT OT with recommendation for home PT OT.  Hospital Course:  Principal Problem:   C. difficile colitis Active Problems:   VRE (vancomycin-resistant Enterococci) infection  Generalized weakness with diarrhea and nausea Diarrhea and nausea have resolved Continue to encourage oral intake   VRE UTI, POA Overactive bladder Acute Urinary retention--s/p foley placement in ER, DC'd on 08/12/2023. --Completed 7 days course of Zyvox. Dr. Chipper Herb  discussed with on-call infectious disease Dr. Rutha Bouchard, recommended discontinue antibiotics.  --Cont flomax, Gemtesa --Pt follows with Urology Dr Lonna Cobb   Deconditioning and fall -No LOC no head or neck injury.  Patient was held sliding down slowly this morning.  No other events of fall at home according to significant other. -PT OT evaluation-- recommends rehab however patient is not interested in rehab. Social worker discussed and patient does seem to plan increase aide hours and continue PT OT at [facility] independent living Continue PT OT with fall precautions.   AKI/ dehydration in the setting of CKD stage IIIb Improving -Secondary to repeated nauseous vomiting and diarrhea -received IV fluids. -- Labs stable Follow-up with your primary care provider   IDDM2 Resume home regimen. Last hemoglobin A1c 6.7 on 02/27/2023.   Diabetic neuropathy -Continue gabapentin   Discharge Exam: BP 128/72 (BP Location: Left Arm)   Pulse 78   Temp 97.8 F (36.6 C) (Oral)   Resp 17   Ht 5\' 8"  (1.727 m)   Wt 86.2 kg   SpO2 95%   BMI 28.89 kg/m  General: 89 y.o. year-old male well developed well nourished in no acute distress.  Alert and oriented x3. Cardiovascular: Regular rate and rhythm with no rubs or gallops.  No thyromegaly or JVD noted.   Respiratory: Clear to auscultation with no wheezes or rales. Good inspiratory effort. Abdomen: Soft nontender nondistended with normal bowel sounds x4 quadrants. Musculoskeletal: No lower extremity edema. 2/4 pulses in all 4 extremities. Skin: No ulcerative lesions noted or rashes, Psychiatry: Mood is appropriate for condition and setting  Discharge Instructions You were  cared for by a hospitalist during your hospital stay. If you have any questions about your discharge medications or the care you received while you were in the hospital after you are discharged, you can call the unit and asked to speak with the hospitalist on call if the  hospitalist that took care of you is not available. Once you are discharged, your primary care physician will handle any further medical issues. Please note that NO REFILLS for any discharge medications will be authorized once you are discharged, as it is imperative that you return to your primary care physician (or establish a relationship with a primary care physician if you do not have one) for your aftercare needs so that they can reassess your need for medications and monitor your lab values.   Allergies as of 08/13/2023       Reactions   Dulaglutide    Other reaction(s): upset stomach Other reaction(s): Abdominal Pain, upset stomach Other reaction(s): upset stomach   Empagliflozin Other (See Comments)   Other reaction(s): yeast infection Other reaction(s): Other (See Comments) Other reaction(s): yeast infection   Lisinopril Cough   tachycardia   Nexium [esomeprazole] Other (See Comments)   Headache   Oxycodone Other (See Comments)   Even 1/2 tablet of 5mg  caused increased confusion - contributory to hospitalization 10/2020 with AMS   Pregabalin Nausea Only, Other (See Comments)   Other reaction(s): MALAISE, SLIGHT NAUSEA, SLIGHT HEADACHE Other reaction(s): MALAISE, SLIGHT NAUSEA, SLIGHT HEADACHE   Rosuvastatin Other (See Comments)   Other reaction(s): muscle pain 2.16.2022 Pt is current on this medication without any issues. Other reaction(s): muscle pain 2.16.2022 Pt is current on this medication without any issues. Other reaction(s): muscle pain        Medication List     STOP taking these medications    fluticasone 110 MCG/ACT inhaler Commonly known as: Flovent HFA   furosemide 20 MG tablet Commonly known as: LASIX   Gemtesa 75 MG Tabs Generic drug: Vibegron Replaced by: mirabegron ER 25 MG Tb24 tablet   nystatin 100000 UNIT/ML suspension Commonly known as: MYCOSTATIN   Tresiba FlexTouch 200 UNIT/ML FlexTouch Pen Generic drug: insulin degludec        TAKE these medications    acidophilus Caps capsule Take 2 capsules by mouth 3 (three) times daily.   Alpha-Lipoic Acid 600 MG Caps Take 1 capsule (600 mg total) by mouth daily.   ascorbic acid 500 MG tablet Commonly known as: VITAMIN C Take 1,000 mg by mouth daily as needed.   brimonidine-timolol 0.2-0.5 % ophthalmic solution Commonly known as: COMBIGAN Place 1 drop into both eyes every 12 (twelve) hours.   clopidogrel 75 MG tablet Commonly known as: PLAVIX TAKE 1 TABLET DAILY   COQ10 150 MG Caps Take 1 capsule by mouth daily.   DULoxetine 60 MG capsule Commonly known as: CYMBALTA TAKE 1 CAPSULE DAILY What changed: when to take this   famotidine 20 MG tablet Commonly known as: Pepcid Take 1 tablet (20 mg total) by mouth at bedtime.   gabapentin 300 MG capsule Commonly known as: NEURONTIN Take 2 capsules (600 mg total) by mouth 3 (three) times daily.   insulin aspart 100 UNIT/ML injection Commonly known as: novoLOG CBG 70 - 120: 0 units  CBG 121 - 150: 1 unit  CBG 151 - 200: 2 units  CBG 201 - 250: 3 units  CBG 251 - 300: 5 units  CBG 301 - 350: 7 units  CBG 351 - 400: 9 units  levothyroxine 50 MCG tablet Commonly known as: SYNTHROID Take 1 tablet (50 mcg total) by mouth daily before breakfast. Per endo Dr Sharl Ma   mirabegron ER 25 MG Tb24 tablet Commonly known as: MYRBETRIQ Take 1 tablet (25 mg total) by mouth daily. Start taking on: August 14, 2023 Replaces: Gemtesa 75 MG Tabs   nystatin cream Commonly known as: MYCOSTATIN Apply 1 Application topically 2 (two) times daily. Apply to affected area   ondansetron 4 MG tablet Commonly known as: ZOFRAN Take 1 tablet (4 mg total) by mouth daily as needed for nausea or vomiting.   pramipexole 1 MG tablet Commonly known as: MIRAPEX TAKE 1 TABLET DAILY AT 4 P.M.   RABEprazole 20 MG tablet Commonly known as: Aciphex Take 1 tablet (20 mg total) by mouth daily.   rosuvastatin 40 MG tablet Commonly  known as: CRESTOR TAKE 1 TABLET DAILY What changed: when to take this   sennosides-docusate sodium 8.6-50 MG tablet Commonly known as: SENOKOT-S Take 1 tablet by mouth daily as needed for constipation. What changed:  when to take this reasons to take this   sodium chloride 5 % ophthalmic solution Commonly known as: MURO 128 1 drop as needed for eye irritation.   tamsulosin 0.4 MG Caps capsule Commonly known as: FLOMAX Take 1 capsule (0.4 mg total) by mouth daily after supper. What changed: See the new instructions.   UltiCare Short Pen Needles 31G X 8 MM Misc Generic drug: Insulin Pen Needle USE TO INJECT MEDICATION DAILY       Allergies  Allergen Reactions   Dulaglutide     Other reaction(s): upset stomach Other reaction(s): Abdominal Pain, upset stomach Other reaction(s): upset stomach    Empagliflozin Other (See Comments)    Other reaction(s): yeast infection Other reaction(s): Other (See Comments) Other reaction(s): yeast infection    Lisinopril Cough    tachycardia   Nexium [Esomeprazole] Other (See Comments)    Headache   Oxycodone Other (See Comments)    Even 1/2 tablet of 5mg  caused increased confusion - contributory to hospitalization 10/2020 with AMS   Pregabalin Nausea Only and Other (See Comments)    Other reaction(s): MALAISE, SLIGHT NAUSEA, SLIGHT HEADACHE Other reaction(s): MALAISE, SLIGHT NAUSEA, SLIGHT HEADACHE    Rosuvastatin Other (See Comments)    Other reaction(s): muscle pain 2.16.2022 Pt is current on this medication without any issues. Other reaction(s): muscle pain 2.16.2022 Pt is current on this medication without any issues. Other reaction(s): muscle pain    Follow-up Information     Eustaquio Boyden, MD. Call today.   Specialty: Family Medicine Why: Please call for a posthospital follow-up appointment. Contact information: [address] [place] [address] [phone]                  The results of  significant diagnostics from this hospitalization (including imaging, microbiology, ancillary and laboratory) are listed below for reference.    Significant Diagnostic Studies: DG Foot 2 Views Left  Result Date: 08/10/2023 CLINICAL DATA:  Left great toe swelling and ulcer. EXAM: LEFT FOOT - 2 VIEW COMPARISON:  November 21, 2021. FINDINGS: Status post surgical amputation of the second middle and distal phalanges. No fracture or dislocation is noted. Joint spaces are intact. No definite lytic destruction is noted currently. IMPRESSION: Postsurgical changes as noted above.  No acute abnormality seen. Electronically Signed   By: Lupita Raider M.D.   On: 08/10/2023 11:29   DG Chest Port 1 View  Result Date: 08/10/2023  CLINICAL DATA:  Lightheadedness and generalized weakness. EXAM: PORTABLE CHEST 1 VIEW COMPARISON:  07/22/2023 FINDINGS: The heart size and mediastinal contours are within normal limits. Low bilateral lung volumes. There is no evidence of pulmonary edema, consolidation, pneumothorax or pleural fluid. The visualized skeletal structures are unremarkable. IMPRESSION: Low lung volumes. No acute findings. Electronically Signed   By: Irish Lack M.D.   On: 08/10/2023 11:23   DG Pelvis Portable  Result Date: 07/22/2023 CLINICAL DATA:  Level 2 trauma, fall EXAM: PORTABLE PELVIS 1-2 VIEWS COMPARISON:  None Available. FINDINGS: No fracture or dislocation is seen. Bilateral hip joint spaces are preserved. Visualized bony pelvis appears intact. IMPRESSION: Negative. Electronically Signed   By: Charline Bills M.D.   On: 07/22/2023 20:10   DG Chest Port 1 View  Result Date: 07/22/2023 CLINICAL DATA:  Level 2 trauma, fall EXAM: PORTABLE CHEST 1 VIEW COMPARISON:  05/24/2023 FINDINGS: Bilateral lower lobe scarring. No focal consolidation. No pleural effusion or pneumothorax. The heart is normal in size.  Thoracic aortic atherosclerosis. IMPRESSION: No acute cardiopulmonary disease. Electronically  Signed   By: Charline Bills M.D.   On: 07/22/2023 20:09   CT Renal Stone Study  Result Date: 07/22/2023 CLINICAL DATA:  Level 2 trauma, fall, on blood thinners, abdominal pain EXAM: CT ABDOMEN AND PELVIS WITHOUT CONTRAST TECHNIQUE: Multidetector CT imaging of the abdomen and pelvis was performed following the standard protocol without IV contrast. RADIATION DOSE REDUCTION: This exam was performed according to the departmental dose-optimization program which includes automated exposure control, adjustment of the mA and/or kV according to patient size and/or use of iterative reconstruction technique. COMPARISON:  01/01/2022 FINDINGS: Lower chest: Mild subpleural reticulation/fibrosis in the lung bases. Hepatobiliary: Unenhanced liver is unremarkable. Gallbladder is unremarkable. No intrahepatic or extrahepatic duct dilatation. Pancreas: Within normal limits, noting mild parenchymal atrophy. Spleen: Within normal limits. Adrenals/Urinary Tract: Adrenal glands within normal limits. Bilateral renal cysts, including a dominant 3.2 cm left renal sinus cyst (series 3/image 42), benign (Bosniak I). No follow-up recommended. 3 mm nonobstructing interpolar right renal calculus (series 3/image 39). No ureteral or bladder calculi. No hydronephrosis. Thick-walled, trabeculated bladder (series 3/image 75), suggesting sequela of chronic bladder obstruction versus cystitis. Stomach/Bowel: Stomach is within normal limits. No evidence of bowel obstruction. Appendix is not discretely visualized. Extensive left colonic diverticulosis, without evidence of diverticulitis. Vascular/Lymphatic: No evidence of abdominal aortic aneurysm. Atherosclerotic calcifications of the abdominal aorta and branch vessels. No suspicious abdominopelvic lymphadenopathy. Reproductive: Prostate is unremarkable. Other: No abdominopelvic ascites. Postsurgical changes in the right inguinal region. Tiny fat containing left inguinal hernia. Musculoskeletal:  Mild degenerative changes of the visualized thoracolumbar spine. IMPRESSION: No CT findings to account for the patient's abdominal pain. 3 mm nonobstructing interpolar right renal calculus. No ureteral or bladder calculi. No hydronephrosis. Extensive left colonic diverticulosis, without evidence of diverticulitis. Thick-walled, trabeculated bladder, suggesting sequela of chronic bladder outlet obstruction versus cystitis. Electronically Signed   By: Charline Bills M.D.   On: 07/22/2023 20:08   CT HEAD WO CONTRAST  Result Date: 07/22/2023 CLINICAL DATA:  Level 2 trauma, fall, on blood thinners EXAM: CT HEAD WITHOUT CONTRAST CT CERVICAL SPINE WITHOUT CONTRAST TECHNIQUE: Multidetector CT imaging of the head and cervical spine was performed following the standard protocol without intravenous contrast. Multiplanar CT image reconstructions of the cervical spine were also generated. RADIATION DOSE REDUCTION: This exam was performed according to the departmental dose-optimization program which includes automated exposure control, adjustment of the mA and/or kV according to patient size and/or use  of iterative reconstruction technique. COMPARISON:  MRI brain dated 07/02/2023. FINDINGS: CT HEAD FINDINGS Brain: No evidence of acute infarction, hemorrhage, hydrocephalus, extra-axial collection or mass lesion/mass effect. Subcortical white matter and periventricular small vessel ischemic changes. Old left basal ganglia lacunar infarct. Vascular: Mild intracranial atherosclerosis. Skull: Normal. Negative for fracture or focal lesion. Sinuses/Orbits: The visualized paranasal sinuses are essentially clear. The mastoid air cells are unopacified. Other: None. CT CERVICAL SPINE FINDINGS Alignment: Normal cervical lordosis. Skull base and vertebrae: No acute fracture. No primary bone lesion or focal pathologic process. Soft tissues and spinal canal: No prevertebral fluid or swelling. No visible canal hematoma. Disc levels:  Moderate degenerative changes of the lower cervical spine. Spinal canal is patent. Upper chest: Lung apices are clear. Other: Visualized thyroid is unremarkable. IMPRESSION: No acute intracranial abnormality. Small vessel ischemic changes. Old left basal ganglia lacunar infarct. No traumatic injury to the cervical spine. Moderate degenerative changes. Electronically Signed   By: Charline Bills M.D.   On: 07/22/2023 20:04   CT CERVICAL SPINE WO CONTRAST  Result Date: 07/22/2023 CLINICAL DATA:  Level 2 trauma, fall, on blood thinners EXAM: CT HEAD WITHOUT CONTRAST CT CERVICAL SPINE WITHOUT CONTRAST TECHNIQUE: Multidetector CT imaging of the head and cervical spine was performed following the standard protocol without intravenous contrast. Multiplanar CT image reconstructions of the cervical spine were also generated. RADIATION DOSE REDUCTION: This exam was performed according to the departmental dose-optimization program which includes automated exposure control, adjustment of the mA and/or kV according to patient size and/or use of iterative reconstruction technique. COMPARISON:  MRI brain dated 07/02/2023. FINDINGS: CT HEAD FINDINGS Brain: No evidence of acute infarction, hemorrhage, hydrocephalus, extra-axial collection or mass lesion/mass effect. Subcortical white matter and periventricular small vessel ischemic changes. Old left basal ganglia lacunar infarct. Vascular: Mild intracranial atherosclerosis. Skull: Normal. Negative for fracture or focal lesion. Sinuses/Orbits: The visualized paranasal sinuses are essentially clear. The mastoid air cells are unopacified. Other: None. CT CERVICAL SPINE FINDINGS Alignment: Normal cervical lordosis. Skull base and vertebrae: No acute fracture. No primary bone lesion or focal pathologic process. Soft tissues and spinal canal: No prevertebral fluid or swelling. No visible canal hematoma. Disc levels: Moderate degenerative changes of the lower cervical spine. Spinal  canal is patent. Upper chest: Lung apices are clear. Other: Visualized thyroid is unremarkable. IMPRESSION: No acute intracranial abnormality. Small vessel ischemic changes. Old left basal ganglia lacunar infarct. No traumatic injury to the cervical spine. Moderate degenerative changes. Electronically Signed   By: Charline Bills M.D.   On: 07/22/2023 20:04    Microbiology: Recent Results (from the past 240 hour(s))  Blood Culture (routine x 2)     Status: None (Preliminary result)   Collection Time: 08/10/23 10:58 AM   Specimen: BLOOD RIGHT HAND  Result Value Ref Range Status   Specimen Description BLOOD RIGHT HAND  Final   Special Requests   Final    BOTTLES DRAWN AEROBIC AND ANAEROBIC Blood Culture results may not be optimal due to an inadequate volume of blood received in culture bottles   Culture   Final    NO GROWTH 3 DAYS Performed at [facility], [address] [place], [place] [address]    Report Status PENDING  Incomplete  Blood Culture (routine x 2)     Status: None (Preliminary result)   Collection Time: 08/10/23 10:58 AM   Specimen: BLOOD  Result Value Ref Range Status   Specimen Description BLOOD LEFT ANTECUBITAL  Final   Special Requests  Final    BOTTLES DRAWN AEROBIC ONLY Blood Culture results may not be optimal due to an inadequate volume of blood received in culture bottles   Culture   Final    NO GROWTH 3 DAYS Performed at [facility], [address] [place], [place] [address]    Report Status PENDING  Incomplete  Urine Culture     Status: None   Collection Time: 08/10/23 10:58 AM   Specimen: Urine, Random  Result Value Ref Range Status   Specimen Description   Final    URINE, RANDOM Performed at [facility], [address], [place], [place] [address]    Special Requests   Final    NONE Reflexed from [id number] Performed at [facility], [name] [place], [place] [address]    Culture   Final    NO  GROWTH Performed at [facility], [address] [place], [place] [address]    Report Status 08/11/2023 FINAL  Final     Labs: Basic Metabolic Panel: Recent Labs  Lab 08/10/23 1058 08/11/23 0426  NA 135 138  K 5.0 3.8  CL 101 107  CO2 23 24  GLUCOSE 146* 135*  BUN 26* 22  CREATININE 1.77* 1.65*  CALCIUM 8.4* 7.8*   Liver Function Tests: Recent Labs  Lab 08/10/23 1058  AST 42*  ALT 18  ALKPHOS 47  BILITOT 1.5*  PROT 7.9  ALBUMIN 3.6   No results for input(s): "LIPASE", "AMYLASE" in the last 168 hours. No results for input(s): "AMMONIA" in the last 168 hours. CBC: Recent Labs  Lab 08/10/23 1058 08/11/23 0426  WBC 11.0* 8.3  NEUTROABS 8.0*  --   HGB 14.4 11.9*  HCT 43.9 36.3*  MCV 92.0 92.1  PLT 182 174   Cardiac Enzymes: No results for input(s): "CKTOTAL", "CKMB", "CKMBINDEX", "TROPONINI" in the last 168 hours. BNP: BNP (last 3 results) No results for input(s): "BNP" in the last 8760 hours.  ProBNP (last 3 results) Recent Labs    06/22/23 1320  PROBNP 33.0    CBG: Recent Labs  Lab 08/12/23 1146 08/12/23 1541 08/12/23 2106 08/13/23 0800 08/13/23 1111  GLUCAP 231* 118* 132* 136* 148*       Signed:  Darlin Drop, MD [facility] 08/13/2023, 5:58 PM

## 2023-08-14 DIAGNOSIS — R2689 Other abnormalities of gait and mobility: Secondary | ICD-10-CM | POA: Diagnosis not present

## 2023-08-14 DIAGNOSIS — Z9181 History of falling: Secondary | ICD-10-CM | POA: Diagnosis not present

## 2023-08-14 DIAGNOSIS — M6281 Muscle weakness (generalized): Secondary | ICD-10-CM | POA: Diagnosis not present

## 2023-08-14 DIAGNOSIS — R2681 Unsteadiness on feet: Secondary | ICD-10-CM | POA: Diagnosis not present

## 2023-08-15 ENCOUNTER — Ambulatory Visit: Payer: Medicare PPO | Admitting: Family Medicine

## 2023-08-15 ENCOUNTER — Other Ambulatory Visit (HOSPITAL_COMMUNITY): Payer: Self-pay

## 2023-08-15 ENCOUNTER — Telehealth (HOSPITAL_COMMUNITY): Payer: Self-pay

## 2023-08-15 ENCOUNTER — Other Ambulatory Visit: Payer: Self-pay

## 2023-08-15 ENCOUNTER — Encounter: Payer: Medicare PPO | Admitting: Family Medicine

## 2023-08-15 ENCOUNTER — Encounter: Payer: Self-pay | Admitting: Family Medicine

## 2023-08-15 VITALS — BP 116/64 | Temp 97.6°F | Ht 65.5 in | Wt 183.4 lb

## 2023-08-15 DIAGNOSIS — I69321 Dysphasia following cerebral infarction: Secondary | ICD-10-CM | POA: Diagnosis not present

## 2023-08-15 DIAGNOSIS — R350 Frequency of micturition: Secondary | ICD-10-CM

## 2023-08-15 DIAGNOSIS — E1165 Type 2 diabetes mellitus with hyperglycemia: Secondary | ICD-10-CM | POA: Diagnosis not present

## 2023-08-15 DIAGNOSIS — Z794 Long term (current) use of insulin: Secondary | ICD-10-CM

## 2023-08-15 DIAGNOSIS — I69398 Other sequelae of cerebral infarction: Secondary | ICD-10-CM | POA: Diagnosis not present

## 2023-08-15 DIAGNOSIS — N183 Chronic kidney disease, stage 3 unspecified: Secondary | ICD-10-CM | POA: Diagnosis not present

## 2023-08-15 DIAGNOSIS — A491 Streptococcal infection, unspecified site: Secondary | ICD-10-CM

## 2023-08-15 DIAGNOSIS — R112 Nausea with vomiting, unspecified: Secondary | ICD-10-CM | POA: Diagnosis not present

## 2023-08-15 DIAGNOSIS — R531 Weakness: Secondary | ICD-10-CM

## 2023-08-15 DIAGNOSIS — N138 Other obstructive and reflux uropathy: Secondary | ICD-10-CM

## 2023-08-15 DIAGNOSIS — I69391 Dysphagia following cerebral infarction: Secondary | ICD-10-CM | POA: Diagnosis not present

## 2023-08-15 DIAGNOSIS — N401 Enlarged prostate with lower urinary tract symptoms: Secondary | ICD-10-CM

## 2023-08-15 DIAGNOSIS — R490 Dysphonia: Secondary | ICD-10-CM | POA: Diagnosis not present

## 2023-08-15 DIAGNOSIS — E1122 Type 2 diabetes mellitus with diabetic chronic kidney disease: Secondary | ICD-10-CM

## 2023-08-15 DIAGNOSIS — I69351 Hemiplegia and hemiparesis following cerebral infarction affecting right dominant side: Secondary | ICD-10-CM

## 2023-08-15 DIAGNOSIS — R2681 Unsteadiness on feet: Secondary | ICD-10-CM | POA: Diagnosis not present

## 2023-08-15 DIAGNOSIS — R2689 Other abnormalities of gait and mobility: Secondary | ICD-10-CM | POA: Diagnosis not present

## 2023-08-15 DIAGNOSIS — Z9181 History of falling: Secondary | ICD-10-CM | POA: Diagnosis not present

## 2023-08-15 DIAGNOSIS — M6281 Muscle weakness (generalized): Secondary | ICD-10-CM | POA: Diagnosis not present

## 2023-08-15 DIAGNOSIS — R197 Diarrhea, unspecified: Secondary | ICD-10-CM | POA: Diagnosis not present

## 2023-08-15 DIAGNOSIS — Z1621 Resistance to vancomycin: Secondary | ICD-10-CM

## 2023-08-15 LAB — BASIC METABOLIC PANEL
BUN: 27 mg/dL — ABNORMAL HIGH (ref 6–23)
CO2: 27 meq/L (ref 19–32)
Calcium: 9.2 mg/dL (ref 8.4–10.5)
Chloride: 104 meq/L (ref 96–112)
Creatinine, Ser: 1.99 mg/dL — ABNORMAL HIGH (ref 0.40–1.50)
GFR: 29.1 mL/min — ABNORMAL LOW (ref >60.00)
Glucose, Bld: 91 mg/dL (ref 70–99)
Potassium: 4.2 meq/L (ref 3.5–5.1)
Sodium: 138 meq/L (ref 135–145)

## 2023-08-15 LAB — CULTURE, BLOOD (ROUTINE X 2)
Culture: NO GROWTH
Culture: NO GROWTH

## 2023-08-15 LAB — VITAMIN D 25 HYDROXY (VIT D DEFICIENCY, FRACTURES): VITD: 58.19 ng/mL (ref 30.00–100.00)

## 2023-08-15 NOTE — Assessment & Plan Note (Signed)
Continue tamsulosin.

## 2023-08-15 NOTE — Assessment & Plan Note (Signed)
Continues slowly improving.

## 2023-08-15 NOTE — Assessment & Plan Note (Signed)
Reviewed dietary calcium intake. Update Ca level with vit D today

## 2023-08-15 NOTE — Assessment & Plan Note (Signed)
This is better since starting Myrbetriq which was more effective than Gemtesa.

## 2023-08-15 NOTE — Progress Notes (Signed)
Ph: [phone] Fax: [phone]   Patient ID: James Sarks, James Bell, male    DOB: 11-02-1930, 89 y.o.   MRN: [id number]  This visit was conducted in person.  BP 116/64   Temp 97.6 F (36.4 C) (Oral)   Ht 5' 5.5" (1.664 m)   Wt 183 lb 6 oz (83.2 kg)   SpO2 98% Comment: RA  BMI 30.05 kg/m    CC: hosp f/u visit  Subjective:   HPI: James Sarks, James Bell is a 89 y.o. male presenting on 08/15/2023 for Hospitalization Follow-up (Admitted on 08/10/23 at [facility], dx generalized weakness; cystitis; type 2 DM; stage 3 CKD. Pt accompanied by niece, Darlene.)   Recent hospitalization for nausea vomiting diarrhea with associated weakness and falls. There was mention of C diff colitis on discharge summary but I don't see any testing for this or treatment with vanc - pt confirms he did NOT have C diff.  This in setting of previously diagnosed vancomycin-resistant Enterococcus UTI (06/22/2023) for which he had started Zyvox 1 week prior, complicated by overactive bladder symptoms with acute urinary retention status post Foley placement in the ER, removed prior to discharge. He has seen urologist.  Hospital records reviewed. Med rec performed. Blcx neg x2 final.  Acute kidney injury thought due to dehydration which did improve after IV fluid rehydration. Diarrhea did improve. Leslye Peer was discontinued as well as Lasix, Flonase. Myrbetriq 25 mg at that was started. Flomax was continued, with planned outpatient follow-up with Dr. Lonna Cobb his urologist.  Since home, staying very tired. No ongoing diarrhea, fevers/chills, voiding overall well - less frequent recently. No further nausea/vomiting.   DM - continues novolog SSI with Tresiba 90 units. He also continues Ozempic 1mg  weekly.   Prior to hospitalization, due to worsening kidney function, he had been referred to nephrology and saw Dr. Thedore Mins on November 6.  Chronic kidney disease with ACE inhibitor/ARB on hold, not a candidate for SGLT 2  inhibitor due to multiple recent UTIs.   Home health was set up for PT and OT through [facility], where he previously went.  Patient declined outpatient rehab placement. Other follow up appointments scheduled: Urology Hilton Sinclair PA December 5, nephrology [facility] January 8.  Continues PT/OT through [facility] ______________________________________________________________________ Hospital admission: 08/10/2023 Hospital discharge: 08/13/2023 TCM f/u phone call: not performed but seen within 2 days of discharge.   Recommendations for Outpatient Follow-up:  1. Follow-up with your primary care provider. 2. Take your medications as prescribed.   Discharge Diagnoses:   C. difficile colitis --> error   VRE (vancomycin-resistant Enterococci) infection 07/02/2023     Relevant past medical, surgical, family and social history reviewed and updated as indicated. Interim medical history since our last visit reviewed. Allergies and medications reviewed and updated. Outpatient Medications Prior to Visit  Medication Sig Dispense Refill   Alpha-Lipoic Acid 600 MG CAPS Take 1 capsule (600 mg total) by mouth daily. 30 capsule 11   brimonidine-timolol (COMBIGAN) 0.2-0.5 % ophthalmic solution Place 1 drop into both eyes every 12 (twelve) hours.     clopidogrel (PLAVIX) 75 MG tablet TAKE 1 TABLET DAILY 90 tablet 1   Coenzyme Q10 (COQ10) 150 MG CAPS Take 1 capsule by mouth daily.     DULoxetine (CYMBALTA) 60 MG capsule TAKE 1 CAPSULE DAILY (Patient taking differently: Take 60 mg by mouth at bedtime.) 90 capsule 3   famotidine (PEPCID) 20 MG tablet Take 1 tablet (20 mg total) by mouth at bedtime.  gabapentin (NEURONTIN) 300 MG capsule Take 2 capsules (600 mg total) by mouth 3 (three) times daily. (Patient taking differently: Take 600 mg by mouth 3 (three) times daily. Takes 2 capsules four times a day) 540 capsule 1   insulin aspart (NOVOLOG) 100 UNIT/ML injection CBG 70 - 120: 0 units  CBG 121 - 150:  1 unit  CBG 151 - 200: 2 units  CBG 201 - 250: 3 units  CBG 251 - 300: 5 units  CBG 301 - 350: 7 units  CBG 351 - 400: 9 units 10 mL 11   insulin degludec (TRESIBA FLEXTOUCH) 200 UNIT/ML FlexTouch Pen Inject 90 Units into the skin daily.     Insulin Pen Needle (ULTICARE SHORT PEN NEEDLES) 31G X 8 MM MISC USE TO INJECT MEDICATION DAILY 100 each 3   levothyroxine (SYNTHROID) 50 MCG tablet Take 1 tablet (50 mcg total) by mouth daily before breakfast. Per endo Dr Sharl Ma     mirabegron ER (MYRBETRIQ) 25 MG TB24 tablet Take 1 tablet (25 mg total) by mouth daily. 90 tablet 0   nystatin cream (MYCOSTATIN) Apply 1 Application topically 2 (two) times daily. Apply to affected area 30 g 0   ondansetron (ZOFRAN) 4 MG tablet Take 1 tablet (4 mg total) by mouth daily as needed for nausea or vomiting. 20 tablet 0   pramipexole (MIRAPEX) 1 MG tablet TAKE 1 TABLET DAILY AT 4 P.M. 90 tablet 3   Probiotic Product (PROBIOTIC PO) Take by mouth in the morning and at bedtime. Takes 2 capsules twice a day     RABEprazole (ACIPHEX) 20 MG tablet Take 1 tablet (20 mg total) by mouth daily. 90 tablet 3   rosuvastatin (CRESTOR) 40 MG tablet TAKE 1 TABLET DAILY (Patient taking differently: Take 40 mg by mouth at bedtime.) 90 tablet 3   Semaglutide, 1 MG/DOSE, 4 MG/3ML SOPN Inject 1 mg into the skin once a week.     sennosides-docusate sodium (SENOKOT-S) 8.6-50 MG tablet Take 1 tablet by mouth daily as needed for constipation. 14 tablet 0   sodium chloride (MURO 128) 5 % ophthalmic solution 1 drop as needed for eye irritation.     tamsulosin (FLOMAX) 0.4 MG CAPS capsule Take 1 capsule (0.4 mg total) by mouth daily after supper. 90 capsule 0   vitamin C (ASCORBIC ACID) 500 MG tablet Take 1,000 mg by mouth daily as needed.     acidophilus (RISAQUAD) CAPS capsule Take 2 capsules by mouth 3 (three) times daily. 180 capsule 2   No facility-administered medications prior to visit.     Per HPI unless specifically indicated in ROS  section below Review of Systems  Objective:  BP 116/64   Temp 97.6 F (36.4 C) (Oral)   Ht 5' 5.5" (1.664 m)   Wt 183 lb 6 oz (83.2 kg)   SpO2 98% Comment: RA  BMI 30.05 kg/m   Wt Readings from Last 3 Encounters:  08/15/23 183 lb 6 oz (83.2 kg)  08/10/23 190 lb (86.2 kg)  07/22/23 185 lb (83.9 kg)      Physical Exam Vitals and nursing note reviewed.  Constitutional:      Appearance: Normal appearance. He is not ill-appearing.     Comments: Sitting in wheelchair, tired appearing  HENT:     Head: Normocephalic and atraumatic.     Mouth/Throat:     Mouth: Mucous membranes are moist.     Pharynx: Oropharynx is clear. No oropharyngeal exudate or posterior oropharyngeal erythema.  Eyes:  Extraocular Movements: Extraocular movements intact.     Conjunctiva/sclera: Conjunctivae normal.     Pupils: Pupils are equal, round, and reactive to light.  Cardiovascular:     Rate and Rhythm: Normal rate and regular rhythm.     Pulses: Normal pulses.     Heart sounds: Normal heart sounds. No murmur heard. Pulmonary:     Effort: Pulmonary effort is normal. No respiratory distress.     Breath sounds: Normal breath sounds. No wheezing, rhonchi or rales.     Comments: Bibasilar crackles that do improve with deep cough, breath Musculoskeletal:     Right lower leg: No edema.     Left lower leg: No edema.  Skin:    General: Skin is warm and dry.     Findings: No rash.  Neurological:     Mental Status: He is alert.     Comments: Chronic R hemiparesis  Psychiatric:        Mood and Affect: Mood normal.        Behavior: Behavior normal.       Lab Results  Component Value Date   NA 138 08/11/2023   CL 107 08/11/2023   K 3.8 08/11/2023   CO2 24 08/11/2023   BUN 22 08/11/2023   CREATININE 1.65 (H) 08/11/2023   GFRNONAA 39 (L) 08/11/2023   CALCIUM 7.8 (L) 08/11/2023   PHOS 4.2 01/02/2022   ALBUMIN 3.6 08/10/2023   GLUCOSE 135 (H) 08/11/2023   Lab Results  Component Value Date    WBC 8.3 08/11/2023   HGB 11.9 (L) 08/11/2023   HCT 36.3 (L) 08/11/2023   MCV 92.1 08/11/2023   PLT 174 08/11/2023   Assessment & Plan:   Problem List Items Addressed This Visit     Hemiparesis affecting right side as late effect of cerebrovascular accident (CVA) (HCC)   Type 2 diabetes mellitus with hyperglycemia (HCC)    Followed by endo. He continues tresiba 90u daily with novolog SSI  Rec hold ozempic as per above.       Relevant Medications   insulin degludec (TRESIBA FLEXTOUCH) 200 UNIT/ML FlexTouch Pen   Semaglutide, 1 MG/DOSE, 4 MG/3ML SOPN   CKD stage 3 due to type 2 diabetes mellitus (HCC)    Has established with nephrology - appreciate their care.  GFR 40-50s previous years, more recently averaging 30s.  Update renal function after recent hospitalization.       Relevant Medications   insulin degludec (TRESIBA FLEXTOUCH) 200 UNIT/ML FlexTouch Pen   Semaglutide, 1 MG/DOSE, 4 MG/3ML SOPN   Other Relevant Orders   Basic metabolic panel   VITAMIN D 25 Hydroxy (Vit-D Deficiency, Fractures)   BPH with obstruction/lower urinary tract symptoms    Continue tamsulosin.      Nausea vomiting and diarrhea - Primary    Symptoms overall improving, unclear etiology. Did suggest continuing to hold ozempic until endo f/u next month.       General weakness    Continues slowly improving.       VRE (vancomycin-resistant Enterococci) infection    Completed treatment with Zyvox.  Blcx x2 no growth final.       Hypocalcemia    Reviewed dietary calcium intake. Update Ca level with vit D today      Urinary frequency    This is better since starting Myrbetriq which was more effective than Gemtesa.         No orders of the defined types were placed in this encounter.   Orders  Placed This Encounter  Procedures   Basic metabolic panel   VITAMIN D 25 Hydroxy (Vit-D Deficiency, Fractures)    Patient Instructions  Hold Ozempic until you see Dr Sharl Ma next month  Lab  test today  Continue current medicines.  Good to see you today  Have a Happy Thanksgiving!   Follow up plan: Return if symptoms worsen or fail to improve.  Eustaquio Boyden, James Bell

## 2023-08-15 NOTE — Assessment & Plan Note (Signed)
Completed treatment with Zyvox.  Blcx x2 no growth final.

## 2023-08-15 NOTE — Telephone Encounter (Signed)
Pharmacy Patient Advocate Encounter   Received notification prior authorization for Mirabegron ER 25MG  er tablets is required/requested.   Insurance verification completed.   The patient is insured through [employer] .   Per test claim: PA required; PA submitted to above mentioned insurance via CoverMyMeds Key/confirmation #/EOC [id number] Status is pending

## 2023-08-15 NOTE — Patient Instructions (Addendum)
Hold Ozempic until you see Dr Sharl Ma next month  Lab test today  Continue current medicines.  Good to see you today  Have a Happy Thanksgiving!

## 2023-08-15 NOTE — Assessment & Plan Note (Signed)
Symptoms overall improving, unclear etiology. Did suggest continuing to hold ozempic until endo f/u next month.

## 2023-08-15 NOTE — Assessment & Plan Note (Addendum)
Has established with nephrology - appreciate their care.  GFR 40-50s previous years, more recently averaging 30s.  Update renal function after recent hospitalization.

## 2023-08-15 NOTE — Assessment & Plan Note (Signed)
Followed by endo. He continues tresiba 90u daily with novolog SSI  Rec hold ozempic as per above.

## 2023-08-18 DIAGNOSIS — E1165 Type 2 diabetes mellitus with hyperglycemia: Secondary | ICD-10-CM | POA: Diagnosis not present

## 2023-08-20 DIAGNOSIS — M6281 Muscle weakness (generalized): Secondary | ICD-10-CM | POA: Diagnosis not present

## 2023-08-20 NOTE — Telephone Encounter (Signed)
Pharmacy Patient Advocate Encounter  Received notification from [employer] that Prior Authorization for Mirabegron ER 25MG  er tablets  has been DENIED.  Full denial letter will be uploaded to the media tab. See denial reason below. Coverage is provided in situations where the patient has had a 12-week trial of ONE of the following AND experienced therapeutic failure: tolterodine extended-release (Detrol LA), oxybutynin IR, oxybutynin ER , trospium (Sanctura), solifenacin (Vesicare), darifenacin (Enablex) or fesoterodine (Toviaz); OR the patient has experienced central nervous system (CNS) adverse effects with an oral overactive bladder (OAB) medication or is at increased risk for CNS adverse effects due to comorbid conditions, advanced age, or other medications. Coverage cannot be authorized at this time.  PA #/Case ID/Reference #: [id number]

## 2023-08-21 DIAGNOSIS — R2681 Unsteadiness on feet: Secondary | ICD-10-CM | POA: Diagnosis not present

## 2023-08-21 DIAGNOSIS — M6281 Muscle weakness (generalized): Secondary | ICD-10-CM | POA: Diagnosis not present

## 2023-08-21 DIAGNOSIS — Z9181 History of falling: Secondary | ICD-10-CM | POA: Diagnosis not present

## 2023-08-21 DIAGNOSIS — R2689 Other abnormalities of gait and mobility: Secondary | ICD-10-CM | POA: Diagnosis not present

## 2023-08-22 DIAGNOSIS — I69321 Dysphasia following cerebral infarction: Secondary | ICD-10-CM | POA: Diagnosis not present

## 2023-08-22 DIAGNOSIS — R2689 Other abnormalities of gait and mobility: Secondary | ICD-10-CM | POA: Diagnosis not present

## 2023-08-22 DIAGNOSIS — R490 Dysphonia: Secondary | ICD-10-CM | POA: Diagnosis not present

## 2023-08-22 DIAGNOSIS — I69398 Other sequelae of cerebral infarction: Secondary | ICD-10-CM | POA: Diagnosis not present

## 2023-08-22 DIAGNOSIS — R41841 Cognitive communication deficit: Secondary | ICD-10-CM | POA: Diagnosis not present

## 2023-08-22 DIAGNOSIS — R2681 Unsteadiness on feet: Secondary | ICD-10-CM | POA: Diagnosis not present

## 2023-08-22 DIAGNOSIS — Z9181 History of falling: Secondary | ICD-10-CM | POA: Diagnosis not present

## 2023-08-22 DIAGNOSIS — I69391 Dysphagia following cerebral infarction: Secondary | ICD-10-CM | POA: Diagnosis not present

## 2023-08-23 ENCOUNTER — Ambulatory Visit: Payer: Medicare PPO | Admitting: Physician Assistant

## 2023-08-23 ENCOUNTER — Encounter: Payer: Self-pay | Admitting: Physician Assistant

## 2023-08-23 VITALS — BP 123/73 | HR 87 | Ht 65.5 in | Wt 184.0 lb

## 2023-08-23 DIAGNOSIS — R490 Dysphonia: Secondary | ICD-10-CM | POA: Diagnosis not present

## 2023-08-23 DIAGNOSIS — N471 Phimosis: Secondary | ICD-10-CM

## 2023-08-23 DIAGNOSIS — I69398 Other sequelae of cerebral infarction: Secondary | ICD-10-CM | POA: Diagnosis not present

## 2023-08-23 DIAGNOSIS — R82998 Other abnormal findings in urine: Secondary | ICD-10-CM

## 2023-08-23 DIAGNOSIS — N401 Enlarged prostate with lower urinary tract symptoms: Secondary | ICD-10-CM | POA: Diagnosis not present

## 2023-08-23 DIAGNOSIS — N489 Disorder of penis, unspecified: Secondary | ICD-10-CM | POA: Diagnosis not present

## 2023-08-23 DIAGNOSIS — R350 Frequency of micturition: Secondary | ICD-10-CM | POA: Diagnosis not present

## 2023-08-23 DIAGNOSIS — B372 Candidiasis of skin and nail: Secondary | ICD-10-CM | POA: Diagnosis not present

## 2023-08-23 DIAGNOSIS — R41841 Cognitive communication deficit: Secondary | ICD-10-CM | POA: Diagnosis not present

## 2023-08-23 DIAGNOSIS — R300 Dysuria: Secondary | ICD-10-CM | POA: Diagnosis not present

## 2023-08-23 DIAGNOSIS — N390 Urinary tract infection, site not specified: Secondary | ICD-10-CM | POA: Diagnosis not present

## 2023-08-23 DIAGNOSIS — I69391 Dysphagia following cerebral infarction: Secondary | ICD-10-CM | POA: Diagnosis not present

## 2023-08-23 DIAGNOSIS — I69321 Dysphasia following cerebral infarction: Secondary | ICD-10-CM | POA: Diagnosis not present

## 2023-08-23 LAB — BLADDER SCAN AMB NON-IMAGING: Scan Result: 0

## 2023-08-23 LAB — MICROSCOPIC EXAMINATION: WBC, UA: >30 /[HPF] — AB (ref 0–5)

## 2023-08-23 LAB — URINALYSIS, COMPLETE
Bilirubin, UA: NEGATIVE
Ketones, UA: NEGATIVE
Nitrite, UA: NEGATIVE
Specific Gravity, UA: 1.025 (ref 1.005–1.030)
Urobilinogen, Ur: 0.2 mg/dL (ref 0.2–1.0)
pH, UA: 5.5 (ref 5.0–7.5)

## 2023-08-23 MED ORDER — FLUCONAZOLE 150 MG PO TABS: 150.0000 mg | ORAL_TABLET | Freq: Once | ORAL | 0 refills | Status: AC

## 2023-08-23 NOTE — Patient Instructions (Signed)

## 2023-08-23 NOTE — Progress Notes (Signed)
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   He was admitted from 08/10/2023 to 08/13/2023 with C. difficile colitis and VRE UTI and was treated with 7 days of Zyvox.  Today he reports chronic, unchanged frequency, urgency, urinary leakage, and dysuria x 3 to 4 months.  Dr. Lonna Cobb recently started him on Gemtesa and he has not noticed a difference on it.  He noticed lesions on his glans penis about 8 months ago.  He applies nystatin cream topically and this will slightly improve them, but they have never resolved.  He wonders if these are the source of his dysuria.  He has also noticed phimosis and is curious about circumcision.  CT stone study dated 07/22/2023 was notable for bilateral Bosniak 1 renal cysts, a 3 mm nonobstructing interpolar right renal stone, and a thick-walled, trabeculated bladder with no hydronephrosis.  In-office UA today positive for trace glucose, 2+ blood, 2+ protein, and 2+ leukocytes; urine microscopy with >30 WBCs/HPF, 11-30 RBCs/HPF, and moderate bacteria. PVR 0mL.  PMH: Past Medical History:  Diagnosis Date   Anemia    Basal cell carcinoma 2018   R temple    Basal cell carcinoma of nose 2002   bridge of nose   BPH with obstruction/lower urinary tract symptoms    failed flomax, uroxatral, myrbetriq - sees urology [facility]) pending videourodynamics   CKD (chronic kidney disease) stage 3, GFR 30-59 ml/min (HCC) 03/11/2012   Renal US 02/2014 - multiple kidney cysts    CVA (cerebral infarction) 03/07/2012   slurred speech; right hemplegia, left handed - completed PT  07/2014 (17 sessions)   Dehydration    Depression    mild, on cymbalta per prior PCP   Erectile dysfunction    per urology (prostaglandin injection)   GERD (gastroesophageal reflux disease)    with sliding HH   H/O hiatal hernia    History of kidney problems 12/2010   lacerated left kidney after fall   HLD (hyperlipidemia)    Hx of basal cell carcinoma 07/10/2017   R. lat. forehead near hair line   Kidney cysts 02/2014   bilateral (renal US)   Migraines 03/11/2012   now rare   OSA on CPAP    sleep study 01/2014 - severe, CPAP at 10, previously on nuvigil (Dr. Gareth Morgan)   Palpitations 01/2012   holter WNL, rare PAC/PVCs   Presbycusis of both ears 2016   rec amplification [facility])   Restless leg syndrome    well controlled on mirapex   Rosacea    [facility]   TIA (transient ischemic attack)    Type 2 diabetes, controlled, with neuropathy (HCC) 2011   Dr Sharl Ma in [facility]    Surgical History: Past Surgical History:  Procedure Laterality Date   ABI  09/18/2005   WNL   AMPUTATION TOE Left 03/02/2023   Procedure: 44010 - TOE INTERPHALANGEAL JOINT - SECOND;  Surgeon: James [facility], James Bell;  Location: [facility] ORS;  Service: Podiatry;  Laterality: Left;   CARDIAC CATHETERIZATION  09/18/2002   normal; [facility]   CARDIOVASCULAR STRESS TEST  02/16/2014   WNL, low  risk scan, EF 68%   CAROTID ENDARTERECTOMY     pt denies 02/27/23   CATARACT EXTRACTION W/ INTRAOCULAR LENS IMPLANT  11//2012 - 08/2011   COLONOSCOPY  03/19/2011   poor prep, unable to biopsy polyp in tic fold, rpt 3 mo Pearlean Brownie)   COLONOSCOPY  06/19/2011   mult polyps adenomatous, severe diverticulosis, rpt 3 yrs Pearlean Brownie)   CYSTOSCOPY WITH URETHRAL DILATATION N/A 07/24/2013   Procedure: CYSTOSCOPY WITH URETHRAL DILATATION;  Surgeon: Kathi Ludwig, MD;  Location: [facility];  Service: Urology;  Laterality: N/A;   ESOPHAGOGASTRODUODENOSCOPY  09/18/2004   duodenitis, medual HH, Grade 2 reflux, mild gastritis    ESOPHAGOGASTRODUODENOSCOPY  07/20/2011   mild chronic gastritis   HEMORRHOID SURGERY  09/18/1989   INGUINAL HERNIA REPAIR  1981; 2002   left, then repaired   PROSTATE SURGERY  02/16/2009   PVP (laser)   SKIN CANCER EXCISION  09/18/2000   bridge of nose, [facility]   TONSILLECTOMY AND ADENOIDECTOMY  ~ 1945   TRANSURETHRAL INCISION OF PROSTATE N/A 07/24/2013   Procedure: TRANSURETHRAL INCISION OF THE PROSTATE (TUIP);  Surgeon: Kathi Ludwig, MD;  Location: [facility];  Service: Urology;  Laterality: N/A;   URETHRAL DILATION  09/18/2012   tannenbaum    Home Medications:  Allergies as of 08/23/2023       Reactions   Dulaglutide    Other reaction(s): upset stomach Other reaction(s): Abdominal Pain, upset stomach Other reaction(s): upset stomach   Empagliflozin Other (See Comments)   Other reaction(s): yeast infection Other reaction(s): Other (See Comments) Other reaction(s): yeast infection   Lisinopril Cough   tachycardia   Nexium [esomeprazole] Other (See Comments)   Headache   Oxycodone Other (See Comments)   Even 1/2 tablet of 5mg  caused increased confusion - contributory to hospitalization 10/2020 with AMS   Pregabalin Nausea Only, Other (See Comments)   Other reaction(s): MALAISE, SLIGHT NAUSEA, SLIGHT HEADACHE Other reaction(s): MALAISE, SLIGHT NAUSEA, SLIGHT HEADACHE   Rosuvastatin Other (See Comments)   Other reaction(s): muscle pain 2.16.2022 Pt is current on this medication without any issues. Other reaction(s): muscle pain 2.16.2022 Pt is current on this medication without any issues. Other reaction(s): muscle pain        Medication List        Accurate as of August 23, 2023 10:59 AM. If you have any questions, ask your nurse or doctor.          Alpha-Lipoic Acid 600 MG Caps Take 1 capsule (600 mg total) by mouth daily.   ascorbic acid 500 MG tablet Commonly known as: VITAMIN C Take 1,000 mg by mouth daily as needed.   brimonidine-timolol 0.2-0.5 %  ophthalmic solution Commonly known as: COMBIGAN Place 1 drop into both eyes every 12 (twelve) hours.   clopidogrel 75 MG tablet Commonly known as: PLAVIX TAKE 1 TABLET DAILY   COQ10 150 MG Caps Take 1 capsule by mouth daily.   DULoxetine 60 MG capsule Commonly known as: CYMBALTA TAKE 1 CAPSULE DAILY What changed: when to take this   famotidine 20 MG tablet Commonly known as: Pepcid Take 1 tablet (20 mg total) by mouth at bedtime.   gabapentin 300 MG capsule Commonly known as: NEURONTIN Take 2 capsules (600 mg total) by mouth 3 (three) times daily. What changed: additional instructions   insulin aspart 100 UNIT/ML injection Commonly known as: novoLOG CBG 70 - 120: 0 units  CBG 121 - 150: 1 unit  CBG 151 - 200: 2 units  CBG 201 -  250: 3 units  CBG 251 - 300: 5 units  CBG 301 - 350: 7 units  CBG 351 - 400: 9 units   levothyroxine 50 MCG tablet Commonly known as: SYNTHROID Take 1 tablet (50 mcg total) by mouth daily before breakfast. Per endo Dr Sharl Ma   mirabegron ER 25 MG Tb24 tablet Commonly known as: MYRBETRIQ Take 1 tablet (25 mg total) by mouth daily.   nystatin cream Commonly known as: MYCOSTATIN Apply 1 Application topically 2 (two) times daily. Apply to affected area   ondansetron 4 MG tablet Commonly known as: ZOFRAN Take 1 tablet (4 mg total) by mouth daily as needed for nausea or vomiting.   pramipexole 1 MG tablet Commonly known as: MIRAPEX TAKE 1 TABLET DAILY AT 4 P.M.   PROBIOTIC PO Take by mouth in the morning and at bedtime. Takes 2 capsules twice a day   RABEprazole 20 MG tablet Commonly known as: Aciphex Take 1 tablet (20 mg total) by mouth daily.   rosuvastatin 40 MG tablet Commonly known as: CRESTOR TAKE 1 TABLET DAILY What changed: when to take this   Semaglutide (1 MG/DOSE) 4 MG/3ML Sopn Inject 1 mg into the skin once a week.   sennosides-docusate sodium 8.6-50 MG tablet Commonly known as: SENOKOT-S Take 1 tablet by mouth daily  as needed for constipation.   sodium chloride 5 % ophthalmic solution Commonly known as: MURO 128 1 drop as needed for eye irritation.   tamsulosin 0.4 MG Caps capsule Commonly known as: FLOMAX Take 1 capsule (0.4 mg total) by mouth daily after supper.   Evaristo Bury FlexTouch 200 UNIT/ML FlexTouch Pen Generic drug: insulin degludec Inject 90 Units into the skin daily.   UltiCare Short Pen Needles 31G X 8 MM Misc Generic drug: Insulin Pen Needle USE TO INJECT MEDICATION DAILY        Allergies:  Allergies  Allergen Reactions   Dulaglutide     Other reaction(s): upset stomach Other reaction(s): Abdominal Pain, upset stomach Other reaction(s): upset stomach    Empagliflozin Other (See Comments)    Other reaction(s): yeast infection Other reaction(s): Other (See Comments) Other reaction(s): yeast infection    Lisinopril Cough    tachycardia   Nexium [Esomeprazole] Other (See Comments)    Headache   Oxycodone Other (See Comments)    Even 1/2 tablet of 5mg  caused increased confusion - contributory to hospitalization 10/2020 with AMS   Pregabalin Nausea Only and Other (See Comments)    Other reaction(s): MALAISE, SLIGHT NAUSEA, SLIGHT HEADACHE Other reaction(s): MALAISE, SLIGHT NAUSEA, SLIGHT HEADACHE    Rosuvastatin Other (See Comments)    Other reaction(s): muscle pain 2.16.2022 Pt is current on this medication without any issues. Other reaction(s): muscle pain 2.16.2022 Pt is current on this medication without any issues. Other reaction(s): muscle pain    Family History: Family History  Problem Relation Age of Onset   Stroke Father    Lung cancer Father 18        smoker   Alcoholism Sister    Kidney disease Sister    Stroke Brother    Diabetes Brother 19   Lung cancer Brother    Coronary artery disease Neg Hx    Sleep apnea Neg Hx     Social History:   reports that he has never smoked. He has never been exposed to tobacco smoke. He has never used smokeless  tobacco. He reports that he does not currently use alcohol after a past usage of about 4.0 standard drinks  of alcohol per week. He reports that he does not use drugs.  Physical Exam: BP 123/73   Pulse 87   Ht 5' 5.5" (1.664 m)   Wt 184 lb (83.5 kg)   BMI 30.15 kg/m   Constitutional:  Alert and oriented, no acute distress, nontoxic appearing HEENT: [place], AT Cardiovascular: No clubbing, cyanosis, or edema Respiratory: Normal respiratory effort, no increased work of breathing GU: Phimosis, though ultimately able to retract the foreskin.  3 flat, hypopigmented lesions of the glans penis with clear borders.  Right candidal intertrigo. Skin: No rashes, bruises or suspicious lesions Neurologic: Grossly intact, no focal deficits, moving all 4 extremities Psychiatric: Normal mood and affect  Laboratory Data: Results for orders placed or performed in visit on 08/23/23  Microscopic Examination   Urine  Result Value Ref Range   WBC, UA >30 (A) 0 - 5 /hpf   RBC, Urine 11-30 (A) 0 - 2 /hpf   Epithelial Cells (non renal) 0-10 0 - 10 /hpf   Mucus, UA Present (A) Not Estab.   Bacteria, UA Moderate (A) None seen/Few  Urinalysis, Complete  Result Value Ref Range   Specific Gravity, UA 1.025 1.005 - 1.030   pH, UA 5.5 5.0 - 7.5   Color, UA Yellow Yellow   Appearance Ur Cloudy (A) Clear   Leukocytes,UA 2+ (A) Negative   Protein,UA 2+ (A) Negative/Trace   Glucose, UA Trace (A) Negative   Ketones, UA Negative Negative   RBC, UA 2+ (A) Negative   Bilirubin, UA Negative Negative   Urobilinogen, Ur 0.2 0.2 - 1.0 mg/dL   Nitrite, UA Negative Negative   Microscopic Examination See below:   Bladder Scan (Post Void Residual) in office  Result Value Ref Range   Scan Result 0    *Note: Due to a large number of results and/or encounters for the requested time period, some results have not been displayed. A complete set of results can be found in Results Review.   Assessment & Plan:   1. Benign  prostatic hyperplasia with urinary frequency Severe LUTS unchanged on Gemtesa, will discontinue this.  He is emptying appropriately.  Given his history of bladder neck contracture, I recommended cystoscopy for further evaluation and he agreed. - Bladder Scan (Post Void Residual) in office  2. Recurrent UTI UA appears grossly infected today, however I suspect his urine sample is contaminated in the setting of phimosis as below.  Will send for culture but defer antibiotics pending results.  He does not appear clinically infected today. - Urinalysis, Complete - CULTURE, URINE COMPREHENSIVE  3. Phimosis Foreskin remains reasonably mobile, may consider circumcision versus dorsal slit in the future per patient preference.  4. Candidal intertrigo Noted on physical exam today.  He has failed topical nystatin.  Will treat with a dose of oral Diflucan. - fluconazole (DIFLUCAN) 150 MG tablet; Take 1 tablet (150 mg total) by mouth once for 1 dose.  Dispense: 1 tablet; Refill: 0  5. Penile lesion 3 lesions of the glans penis have a more hypopigmented appearance but is less consistent with balanitis.  Will treat with oral Diflucan as above, but have him evaluated by Dr. Lonna Cobb at his follow-up visit and consider biopsy if they have persisted. - fluconazole (DIFLUCAN) 150 MG tablet; Take 1 tablet (150 mg total) by mouth once for 1 dose.  Dispense: 1 tablet; Refill: 0   Return in about 3 weeks (around 09/13/2023) for Cysto with Dr. Lonna Cobb.  Carman Ching, PA-C  [place]  [name] [address] [place], [place] [address] [phone]

## 2023-08-24 DIAGNOSIS — R2681 Unsteadiness on feet: Secondary | ICD-10-CM | POA: Diagnosis not present

## 2023-08-24 DIAGNOSIS — Z9181 History of falling: Secondary | ICD-10-CM | POA: Diagnosis not present

## 2023-08-24 DIAGNOSIS — R2689 Other abnormalities of gait and mobility: Secondary | ICD-10-CM | POA: Diagnosis not present

## 2023-08-27 DIAGNOSIS — Z9181 History of falling: Secondary | ICD-10-CM | POA: Diagnosis not present

## 2023-08-27 DIAGNOSIS — M6281 Muscle weakness (generalized): Secondary | ICD-10-CM | POA: Diagnosis not present

## 2023-08-27 DIAGNOSIS — R2689 Other abnormalities of gait and mobility: Secondary | ICD-10-CM | POA: Diagnosis not present

## 2023-08-27 DIAGNOSIS — R2681 Unsteadiness on feet: Secondary | ICD-10-CM | POA: Diagnosis not present

## 2023-08-28 DIAGNOSIS — R2689 Other abnormalities of gait and mobility: Secondary | ICD-10-CM | POA: Diagnosis not present

## 2023-08-28 DIAGNOSIS — Z9181 History of falling: Secondary | ICD-10-CM | POA: Diagnosis not present

## 2023-08-28 DIAGNOSIS — R2681 Unsteadiness on feet: Secondary | ICD-10-CM | POA: Diagnosis not present

## 2023-08-28 DIAGNOSIS — M6281 Muscle weakness (generalized): Secondary | ICD-10-CM | POA: Diagnosis not present

## 2023-08-28 LAB — CULTURE, URINE COMPREHENSIVE

## 2023-08-29 DIAGNOSIS — E1142 Type 2 diabetes mellitus with diabetic polyneuropathy: Secondary | ICD-10-CM | POA: Diagnosis not present

## 2023-08-29 DIAGNOSIS — L603 Nail dystrophy: Secondary | ICD-10-CM | POA: Diagnosis not present

## 2023-08-29 DIAGNOSIS — R41841 Cognitive communication deficit: Secondary | ICD-10-CM | POA: Diagnosis not present

## 2023-08-29 DIAGNOSIS — I739 Peripheral vascular disease, unspecified: Secondary | ICD-10-CM | POA: Diagnosis not present

## 2023-08-29 DIAGNOSIS — L84 Corns and callosities: Secondary | ICD-10-CM | POA: Diagnosis not present

## 2023-08-29 DIAGNOSIS — R490 Dysphonia: Secondary | ICD-10-CM | POA: Diagnosis not present

## 2023-08-29 DIAGNOSIS — L97522 Non-pressure chronic ulcer of other part of left foot with fat layer exposed: Secondary | ICD-10-CM | POA: Diagnosis not present

## 2023-08-29 DIAGNOSIS — I69398 Other sequelae of cerebral infarction: Secondary | ICD-10-CM | POA: Diagnosis not present

## 2023-08-29 DIAGNOSIS — Z89422 Acquired absence of other left toe(s): Secondary | ICD-10-CM | POA: Diagnosis not present

## 2023-08-29 DIAGNOSIS — I69321 Dysphasia following cerebral infarction: Secondary | ICD-10-CM | POA: Diagnosis not present

## 2023-08-29 DIAGNOSIS — I890 Lymphedema, not elsewhere classified: Secondary | ICD-10-CM | POA: Diagnosis not present

## 2023-08-29 DIAGNOSIS — I69391 Dysphagia following cerebral infarction: Secondary | ICD-10-CM | POA: Diagnosis not present

## 2023-08-30 DIAGNOSIS — R2689 Other abnormalities of gait and mobility: Secondary | ICD-10-CM | POA: Diagnosis not present

## 2023-08-30 DIAGNOSIS — R2681 Unsteadiness on feet: Secondary | ICD-10-CM | POA: Diagnosis not present

## 2023-08-30 DIAGNOSIS — I69391 Dysphagia following cerebral infarction: Secondary | ICD-10-CM | POA: Diagnosis not present

## 2023-08-30 DIAGNOSIS — Z9181 History of falling: Secondary | ICD-10-CM | POA: Diagnosis not present

## 2023-08-30 DIAGNOSIS — R41841 Cognitive communication deficit: Secondary | ICD-10-CM | POA: Diagnosis not present

## 2023-08-30 DIAGNOSIS — I69398 Other sequelae of cerebral infarction: Secondary | ICD-10-CM | POA: Diagnosis not present

## 2023-08-30 DIAGNOSIS — R490 Dysphonia: Secondary | ICD-10-CM | POA: Diagnosis not present

## 2023-08-30 DIAGNOSIS — I69321 Dysphasia following cerebral infarction: Secondary | ICD-10-CM | POA: Diagnosis not present

## 2023-08-30 DIAGNOSIS — M6281 Muscle weakness (generalized): Secondary | ICD-10-CM | POA: Diagnosis not present

## 2023-08-31 DIAGNOSIS — R2689 Other abnormalities of gait and mobility: Secondary | ICD-10-CM | POA: Diagnosis not present

## 2023-08-31 DIAGNOSIS — Z9181 History of falling: Secondary | ICD-10-CM | POA: Diagnosis not present

## 2023-08-31 DIAGNOSIS — R2681 Unsteadiness on feet: Secondary | ICD-10-CM | POA: Diagnosis not present

## 2023-09-03 DIAGNOSIS — M6281 Muscle weakness (generalized): Secondary | ICD-10-CM | POA: Diagnosis not present

## 2023-09-03 DIAGNOSIS — I69391 Dysphagia following cerebral infarction: Secondary | ICD-10-CM | POA: Diagnosis not present

## 2023-09-03 DIAGNOSIS — R2689 Other abnormalities of gait and mobility: Secondary | ICD-10-CM | POA: Diagnosis not present

## 2023-09-03 DIAGNOSIS — R490 Dysphonia: Secondary | ICD-10-CM | POA: Diagnosis not present

## 2023-09-03 DIAGNOSIS — Z9181 History of falling: Secondary | ICD-10-CM | POA: Diagnosis not present

## 2023-09-03 DIAGNOSIS — R2681 Unsteadiness on feet: Secondary | ICD-10-CM | POA: Diagnosis not present

## 2023-09-03 DIAGNOSIS — I69321 Dysphasia following cerebral infarction: Secondary | ICD-10-CM | POA: Diagnosis not present

## 2023-09-03 DIAGNOSIS — I69398 Other sequelae of cerebral infarction: Secondary | ICD-10-CM | POA: Diagnosis not present

## 2023-09-03 DIAGNOSIS — R41841 Cognitive communication deficit: Secondary | ICD-10-CM | POA: Diagnosis not present

## 2023-09-04 DIAGNOSIS — R2689 Other abnormalities of gait and mobility: Secondary | ICD-10-CM | POA: Diagnosis not present

## 2023-09-04 DIAGNOSIS — R2681 Unsteadiness on feet: Secondary | ICD-10-CM | POA: Diagnosis not present

## 2023-09-04 DIAGNOSIS — Z9181 History of falling: Secondary | ICD-10-CM | POA: Diagnosis not present

## 2023-09-05 ENCOUNTER — Other Ambulatory Visit: Payer: Self-pay

## 2023-09-05 DIAGNOSIS — E039 Hypothyroidism, unspecified: Secondary | ICD-10-CM | POA: Diagnosis not present

## 2023-09-05 DIAGNOSIS — E1142 Type 2 diabetes mellitus with diabetic polyneuropathy: Secondary | ICD-10-CM | POA: Diagnosis not present

## 2023-09-05 DIAGNOSIS — N390 Urinary tract infection, site not specified: Secondary | ICD-10-CM

## 2023-09-05 DIAGNOSIS — I6930 Unspecified sequelae of cerebral infarction: Secondary | ICD-10-CM | POA: Diagnosis not present

## 2023-09-05 DIAGNOSIS — E785 Hyperlipidemia, unspecified: Secondary | ICD-10-CM | POA: Diagnosis not present

## 2023-09-05 DIAGNOSIS — Z794 Long term (current) use of insulin: Secondary | ICD-10-CM | POA: Diagnosis not present

## 2023-09-05 MED ORDER — CEFUROXIME AXETIL 250 MG PO TABS: 250.0000 mg | ORAL_TABLET | Freq: Two times a day (BID) | ORAL | 0 refills | Status: AC

## 2023-09-06 DIAGNOSIS — R2689 Other abnormalities of gait and mobility: Secondary | ICD-10-CM | POA: Diagnosis not present

## 2023-09-06 DIAGNOSIS — G4733 Obstructive sleep apnea (adult) (pediatric): Secondary | ICD-10-CM | POA: Diagnosis not present

## 2023-09-06 DIAGNOSIS — R2681 Unsteadiness on feet: Secondary | ICD-10-CM | POA: Diagnosis not present

## 2023-09-06 DIAGNOSIS — M6281 Muscle weakness (generalized): Secondary | ICD-10-CM | POA: Diagnosis not present

## 2023-09-06 DIAGNOSIS — Z9181 History of falling: Secondary | ICD-10-CM | POA: Diagnosis not present

## 2023-09-07 ENCOUNTER — Ambulatory Visit: Payer: Medicare PPO | Admitting: Urology

## 2023-09-07 ENCOUNTER — Encounter: Payer: Self-pay | Admitting: Urology

## 2023-09-07 VITALS — BP 130/74 | HR 80 | Ht 65.0 in | Wt 184.0 lb

## 2023-09-07 DIAGNOSIS — N401 Enlarged prostate with lower urinary tract symptoms: Secondary | ICD-10-CM | POA: Diagnosis not present

## 2023-09-07 DIAGNOSIS — R8281 Pyuria: Secondary | ICD-10-CM

## 2023-09-07 DIAGNOSIS — N390 Urinary tract infection, site not specified: Secondary | ICD-10-CM | POA: Diagnosis not present

## 2023-09-07 LAB — MICROSCOPIC EXAMINATION: WBC, UA: >30 /[HPF] — AB (ref 0–5)

## 2023-09-07 LAB — URINALYSIS, COMPLETE
Bilirubin, UA: NEGATIVE
Glucose, UA: NEGATIVE
Ketones, UA: NEGATIVE
Nitrite, UA: NEGATIVE
Specific Gravity, UA: 1.02 (ref 1.005–1.030)
Urobilinogen, Ur: 0.2 mg/dL (ref 0.2–1.0)
pH, UA: 8.5 — ABNORMAL HIGH (ref 5.0–7.5)

## 2023-09-07 NOTE — Progress Notes (Unsigned)
Scheduled for cystoscopy today however urine with significant pyuria.  Urine culture 12/5 was ordered however not resulted until 12/10.  Rx cefuroxime was sent 2 days ago.  Recommend postponing cystoscopy until infection clear.  Will reschedule

## 2023-09-09 DIAGNOSIS — I69391 Dysphagia following cerebral infarction: Secondary | ICD-10-CM | POA: Diagnosis not present

## 2023-09-09 DIAGNOSIS — R41841 Cognitive communication deficit: Secondary | ICD-10-CM | POA: Diagnosis not present

## 2023-09-09 DIAGNOSIS — R490 Dysphonia: Secondary | ICD-10-CM | POA: Diagnosis not present

## 2023-09-09 DIAGNOSIS — I69321 Dysphasia following cerebral infarction: Secondary | ICD-10-CM | POA: Diagnosis not present

## 2023-09-09 DIAGNOSIS — I69398 Other sequelae of cerebral infarction: Secondary | ICD-10-CM | POA: Diagnosis not present

## 2023-09-10 DIAGNOSIS — I69391 Dysphagia following cerebral infarction: Secondary | ICD-10-CM | POA: Diagnosis not present

## 2023-09-10 DIAGNOSIS — I69398 Other sequelae of cerebral infarction: Secondary | ICD-10-CM | POA: Diagnosis not present

## 2023-09-10 DIAGNOSIS — R2681 Unsteadiness on feet: Secondary | ICD-10-CM | POA: Diagnosis not present

## 2023-09-10 DIAGNOSIS — I69321 Dysphasia following cerebral infarction: Secondary | ICD-10-CM | POA: Diagnosis not present

## 2023-09-10 DIAGNOSIS — Z9181 History of falling: Secondary | ICD-10-CM | POA: Diagnosis not present

## 2023-09-10 DIAGNOSIS — R41841 Cognitive communication deficit: Secondary | ICD-10-CM | POA: Diagnosis not present

## 2023-09-10 DIAGNOSIS — R2689 Other abnormalities of gait and mobility: Secondary | ICD-10-CM | POA: Diagnosis not present

## 2023-09-10 DIAGNOSIS — R490 Dysphonia: Secondary | ICD-10-CM | POA: Diagnosis not present

## 2023-09-10 LAB — CULTURE, URINE COMPREHENSIVE

## 2023-09-13 ENCOUNTER — Emergency Department: Payer: Medicare PPO

## 2023-09-13 ENCOUNTER — Inpatient Hospital Stay
Admission: EM | Admit: 2023-09-13 | Discharge: 2023-09-25 | DRG: 177 | Disposition: A | Discharge status: Home Health | Payer: Medicare PPO | Attending: Internal Medicine | Admitting: Internal Medicine

## 2023-09-13 ENCOUNTER — Other Ambulatory Visit: Payer: Self-pay

## 2023-09-13 DIAGNOSIS — I10 Essential (primary) hypertension: Secondary | ICD-10-CM | POA: Diagnosis not present

## 2023-09-13 DIAGNOSIS — Z8673 Personal history of transient ischemic attack (TIA), and cerebral infarction without residual deficits: Secondary | ICD-10-CM | POA: Diagnosis not present

## 2023-09-13 DIAGNOSIS — E16A1 Hypoglycemia level 1: Secondary | ICD-10-CM | POA: Diagnosis not present

## 2023-09-13 DIAGNOSIS — N138 Other obstructive and reflux uropathy: Secondary | ICD-10-CM | POA: Diagnosis present

## 2023-09-13 DIAGNOSIS — Z794 Long term (current) use of insulin: Secondary | ICD-10-CM

## 2023-09-13 DIAGNOSIS — Z85828 Personal history of other malignant neoplasm of skin: Secondary | ICD-10-CM | POA: Diagnosis not present

## 2023-09-13 DIAGNOSIS — Z885 Allergy status to narcotic agent status: Secondary | ICD-10-CM

## 2023-09-13 DIAGNOSIS — E1169 Type 2 diabetes mellitus with other specified complication: Secondary | ICD-10-CM | POA: Diagnosis present

## 2023-09-13 DIAGNOSIS — E039 Hypothyroidism, unspecified: Secondary | ICD-10-CM | POA: Diagnosis present

## 2023-09-13 DIAGNOSIS — G2581 Restless legs syndrome: Secondary | ICD-10-CM | POA: Diagnosis present

## 2023-09-13 DIAGNOSIS — K529 Noninfective gastroenteritis and colitis, unspecified: Secondary | ICD-10-CM

## 2023-09-13 DIAGNOSIS — F809 Developmental disorder of speech and language, unspecified: Principal | ICD-10-CM

## 2023-09-13 DIAGNOSIS — Z7985 Long-term (current) use of injectable non-insulin antidiabetic drugs: Secondary | ICD-10-CM

## 2023-09-13 DIAGNOSIS — Z66 Do not resuscitate: Secondary | ICD-10-CM | POA: Diagnosis not present

## 2023-09-13 DIAGNOSIS — H9113 Presbycusis, bilateral: Secondary | ICD-10-CM | POA: Diagnosis present

## 2023-09-13 DIAGNOSIS — Z7902 Long term (current) use of antithrombotics/antiplatelets: Secondary | ICD-10-CM

## 2023-09-13 DIAGNOSIS — G9341 Metabolic encephalopathy: Secondary | ICD-10-CM | POA: Diagnosis present

## 2023-09-13 DIAGNOSIS — R4789 Other speech disturbances: Secondary | ICD-10-CM | POA: Diagnosis not present

## 2023-09-13 DIAGNOSIS — R479 Unspecified speech disturbances: Secondary | ICD-10-CM | POA: Insufficient documentation

## 2023-09-13 DIAGNOSIS — Z9181 History of falling: Secondary | ICD-10-CM | POA: Diagnosis not present

## 2023-09-13 DIAGNOSIS — H409 Unspecified glaucoma: Secondary | ICD-10-CM | POA: Diagnosis present

## 2023-09-13 DIAGNOSIS — G4733 Obstructive sleep apnea (adult) (pediatric): Secondary | ICD-10-CM

## 2023-09-13 DIAGNOSIS — Z888 Allergy status to other drugs, medicaments and biological substances status: Secondary | ICD-10-CM

## 2023-09-13 DIAGNOSIS — Z833 Family history of diabetes mellitus: Secondary | ICD-10-CM

## 2023-09-13 DIAGNOSIS — H1131 Conjunctival hemorrhage, right eye: Secondary | ICD-10-CM | POA: Diagnosis not present

## 2023-09-13 DIAGNOSIS — R0902 Hypoxemia: Secondary | ICD-10-CM | POA: Diagnosis not present

## 2023-09-13 DIAGNOSIS — E669 Obesity, unspecified: Secondary | ICD-10-CM | POA: Diagnosis present

## 2023-09-13 DIAGNOSIS — E1129 Type 2 diabetes mellitus with other diabetic kidney complication: Secondary | ICD-10-CM | POA: Diagnosis present

## 2023-09-13 DIAGNOSIS — F0393 Unspecified dementia, unspecified severity, with mood disturbance: Secondary | ICD-10-CM | POA: Diagnosis present

## 2023-09-13 DIAGNOSIS — R2681 Unsteadiness on feet: Secondary | ICD-10-CM | POA: Diagnosis not present

## 2023-09-13 DIAGNOSIS — E66811 Obesity, class 1: Secondary | ICD-10-CM | POA: Diagnosis present

## 2023-09-13 DIAGNOSIS — U071 COVID-19: Principal | ICD-10-CM | POA: Diagnosis present

## 2023-09-13 DIAGNOSIS — Z471 Aftercare following joint replacement surgery: Secondary | ICD-10-CM | POA: Diagnosis not present

## 2023-09-13 DIAGNOSIS — R059 Cough, unspecified: Secondary | ICD-10-CM | POA: Diagnosis not present

## 2023-09-13 DIAGNOSIS — N1832 Chronic kidney disease, stage 3b: Secondary | ICD-10-CM | POA: Diagnosis present

## 2023-09-13 DIAGNOSIS — R4781 Slurred speech: Secondary | ICD-10-CM | POA: Diagnosis present

## 2023-09-13 DIAGNOSIS — I6782 Cerebral ischemia: Secondary | ICD-10-CM | POA: Diagnosis not present

## 2023-09-13 DIAGNOSIS — N401 Enlarged prostate with lower urinary tract symptoms: Secondary | ICD-10-CM | POA: Diagnosis present

## 2023-09-13 DIAGNOSIS — I5032 Chronic diastolic (congestive) heart failure: Secondary | ICD-10-CM | POA: Diagnosis present

## 2023-09-13 DIAGNOSIS — R29818 Other symptoms and signs involving the nervous system: Secondary | ICD-10-CM | POA: Diagnosis not present

## 2023-09-13 DIAGNOSIS — Z89422 Acquired absence of other left toe(s): Secondary | ICD-10-CM

## 2023-09-13 DIAGNOSIS — E1140 Type 2 diabetes mellitus with diabetic neuropathy, unspecified: Secondary | ICD-10-CM | POA: Diagnosis present

## 2023-09-13 DIAGNOSIS — Z811 Family history of alcohol abuse and dependence: Secondary | ICD-10-CM

## 2023-09-13 DIAGNOSIS — N390 Urinary tract infection, site not specified: Secondary | ICD-10-CM | POA: Diagnosis present

## 2023-09-13 DIAGNOSIS — E785 Hyperlipidemia, unspecified: Secondary | ICD-10-CM | POA: Diagnosis present

## 2023-09-13 DIAGNOSIS — M6281 Muscle weakness (generalized): Secondary | ICD-10-CM | POA: Diagnosis not present

## 2023-09-13 DIAGNOSIS — B964 Proteus (mirabilis) (morganii) as the cause of diseases classified elsewhere: Secondary | ICD-10-CM | POA: Diagnosis present

## 2023-09-13 DIAGNOSIS — M7989 Other specified soft tissue disorders: Secondary | ICD-10-CM | POA: Diagnosis present

## 2023-09-13 DIAGNOSIS — E11649 Type 2 diabetes mellitus with hypoglycemia without coma: Secondary | ICD-10-CM | POA: Diagnosis present

## 2023-09-13 DIAGNOSIS — I130 Hypertensive heart and chronic kidney disease with heart failure and stage 1 through stage 4 chronic kidney disease, or unspecified chronic kidney disease: Secondary | ICD-10-CM | POA: Diagnosis present

## 2023-09-13 DIAGNOSIS — N3000 Acute cystitis without hematuria: Secondary | ICD-10-CM

## 2023-09-13 DIAGNOSIS — E1122 Type 2 diabetes mellitus with diabetic chronic kidney disease: Secondary | ICD-10-CM | POA: Diagnosis present

## 2023-09-13 DIAGNOSIS — I959 Hypotension, unspecified: Secondary | ICD-10-CM | POA: Diagnosis not present

## 2023-09-13 DIAGNOSIS — Z6372 Alcoholism and drug addiction in family: Secondary | ICD-10-CM

## 2023-09-13 DIAGNOSIS — Z841 Family history of disorders of kidney and ureter: Secondary | ICD-10-CM

## 2023-09-13 DIAGNOSIS — K219 Gastro-esophageal reflux disease without esophagitis: Secondary | ICD-10-CM | POA: Diagnosis present

## 2023-09-13 DIAGNOSIS — Z515 Encounter for palliative care: Secondary | ICD-10-CM | POA: Diagnosis not present

## 2023-09-13 DIAGNOSIS — R531 Weakness: Secondary | ICD-10-CM

## 2023-09-13 DIAGNOSIS — Z79890 Hormone replacement therapy (postmenopausal): Secondary | ICD-10-CM | POA: Diagnosis not present

## 2023-09-13 DIAGNOSIS — I700 Atherosclerosis of aorta: Secondary | ICD-10-CM | POA: Diagnosis not present

## 2023-09-13 DIAGNOSIS — Z823 Family history of stroke: Secondary | ICD-10-CM

## 2023-09-13 DIAGNOSIS — R2689 Other abnormalities of gait and mobility: Secondary | ICD-10-CM | POA: Diagnosis not present

## 2023-09-13 DIAGNOSIS — I69351 Hemiplegia and hemiparesis following cerebral infarction affecting right dominant side: Secondary | ICD-10-CM

## 2023-09-13 DIAGNOSIS — Z801 Family history of malignant neoplasm of trachea, bronchus and lung: Secondary | ICD-10-CM

## 2023-09-13 DIAGNOSIS — Z79899 Other long term (current) drug therapy: Secondary | ICD-10-CM

## 2023-09-13 DIAGNOSIS — E1165 Type 2 diabetes mellitus with hyperglycemia: Secondary | ICD-10-CM | POA: Diagnosis not present

## 2023-09-13 DIAGNOSIS — Z6830 Body mass index (BMI) 30.0-30.9, adult: Secondary | ICD-10-CM

## 2023-09-13 LAB — COMPREHENSIVE METABOLIC PANEL
ALT: 16 U/L (ref 0–44)
AST: 21 U/L (ref 15–41)
Albumin: 3.4 g/dL — ABNORMAL LOW (ref 3.5–5.0)
Alkaline Phosphatase: 44 U/L (ref 38–126)
Anion gap: 10 (ref 5–15)
BUN: 27 mg/dL — ABNORMAL HIGH (ref 8–23)
CO2: 22 mmol/L (ref 22–32)
Calcium: 8.9 mg/dL (ref 8.9–10.3)
Chloride: 106 mmol/L (ref 98–111)
Creatinine, Ser: 1.65 mg/dL — ABNORMAL HIGH (ref 0.61–1.24)
GFR, Estimated: 39 mL/min — ABNORMAL LOW (ref >60)
Glucose, Bld: 95 mg/dL (ref 70–99)
Potassium: 3.4 mmol/L — ABNORMAL LOW (ref 3.5–5.1)
Sodium: 138 mmol/L (ref 135–145)
Total Bilirubin: 0.5 mg/dL (ref <1.2)
Total Protein: 7.5 g/dL (ref 6.5–8.1)

## 2023-09-13 LAB — URINALYSIS, W/ REFLEX TO CULTURE (INFECTION SUSPECTED)
Bacteria, UA: NONE SEEN
Bilirubin Urine: NEGATIVE
Glucose, UA: NEGATIVE mg/dL
Ketones, ur: NEGATIVE mg/dL
Nitrite: NEGATIVE
Protein, ur: 100 mg/dL — AB
Specific Gravity, Urine: 1.034 — ABNORMAL HIGH (ref 1.005–1.030)
WBC, UA: >50 WBC/hpf (ref 0–5)
pH: 5 (ref 5.0–8.0)

## 2023-09-13 LAB — APTT: aPTT: 34 s (ref 24–36)

## 2023-09-13 LAB — DIFFERENTIAL
Abs Immature Granulocytes: 0.02 10*3/uL (ref 0.00–0.07)
Basophils Absolute: 0.1 10*3/uL (ref 0.0–0.1)
Basophils Relative: 1 %
Eosinophils Absolute: 0.4 10*3/uL (ref 0.0–0.5)
Eosinophils Relative: 4 %
Immature Granulocytes: 0 %
Lymphocytes Relative: 26 %
Lymphs Abs: 2.5 10*3/uL (ref 0.7–4.0)
Monocytes Absolute: 0.9 10*3/uL (ref 0.1–1.0)
Monocytes Relative: 9 %
Neutro Abs: 5.7 10*3/uL (ref 1.7–7.7)
Neutrophils Relative %: 60 %

## 2023-09-13 LAB — CBG MONITORING, ED
Glucose-Capillary: 75 mg/dL (ref 70–99)
Glucose-Capillary: 72 mg/dL (ref 70–99)

## 2023-09-13 LAB — CBC
HCT: 39.5 % (ref 39.0–52.0)
Hemoglobin: 12.9 g/dL — ABNORMAL LOW (ref 13.0–17.0)
MCH: 30.4 pg (ref 26.0–34.0)
MCHC: 32.7 g/dL (ref 30.0–36.0)
MCV: 92.9 fL (ref 80.0–100.0)
Platelets: 215 10*3/uL (ref 150–400)
RBC: 4.25 MIL/uL (ref 4.22–5.81)
RDW: 13.7 % (ref 11.5–15.5)
WBC: 9.5 10*3/uL (ref 4.0–10.5)
nRBC: 0 % (ref 0.0–0.2)

## 2023-09-13 LAB — PROTIME-INR
INR: 1.2 (ref 0.8–1.2)
Prothrombin Time: 15 s (ref 11.4–15.2)

## 2023-09-13 LAB — BRAIN NATRIURETIC PEPTIDE: B Natriuretic Peptide: 29.5 pg/mL (ref 0.0–100.0)

## 2023-09-13 LAB — ETHANOL: Alcohol, Ethyl (B): <10 mg/dL (ref <10)

## 2023-09-13 LAB — MAGNESIUM: Magnesium: 2.1 mg/dL (ref 1.7–2.4)

## 2023-09-13 MED ORDER — ONDANSETRON HCL 4 MG/2ML IJ SOLN
4.0000 mg | Freq: Three times a day (TID) | INTRAMUSCULAR | Status: DC | PRN
  Administered 2023-09-19: 4 mg via INTRAVENOUS
  Filled 2023-09-13: qty 2

## 2023-09-13 MED ORDER — INSULIN ASPART 100 UNIT/ML IJ SOLN
0.0000 [IU] | Freq: Every day | INTRAMUSCULAR | Status: DC
  Administered 2023-09-15 – 2023-09-24 (×3): 2 [IU] via SUBCUTANEOUS
  Filled 2023-09-13 (×3): qty 1

## 2023-09-13 MED ORDER — DEXTROSE 50 % IV SOLN
50.0000 mL | INTRAVENOUS | Status: DC | PRN
  Administered 2023-09-13 – 2023-09-23 (×3): 50 mL via INTRAVENOUS
  Filled 2023-09-13 (×3): qty 50

## 2023-09-13 MED ORDER — SODIUM CHLORIDE 0.9 % IV BOLUS
1000.0000 mL | Freq: Once | INTRAVENOUS | Status: AC
  Administered 2023-09-13: 1000 mL via INTRAVENOUS

## 2023-09-13 MED ORDER — IOHEXOL 350 MG/ML SOLN
100.0000 mL | Freq: Once | INTRAVENOUS | Status: AC | PRN
  Administered 2023-09-13: 100 mL via INTRAVENOUS

## 2023-09-13 MED ORDER — SODIUM CHLORIDE 0.9% FLUSH: 3.0000 mL | Freq: Once | INTRAVENOUS | Status: DC

## 2023-09-13 MED ORDER — POTASSIUM CHLORIDE CRYS ER 20 MEQ PO TBCR: 40.0000 meq | EXTENDED_RELEASE_TABLET | Freq: Once | ORAL | Status: DC

## 2023-09-13 MED ORDER — INSULIN ASPART 100 UNIT/ML IJ SOLN
0.0000 [IU] | Freq: Three times a day (TID) | INTRAMUSCULAR | Status: DC
  Administered 2023-09-14 – 2023-09-15 (×2): 1 [IU] via SUBCUTANEOUS
  Administered 2023-09-15: 2 [IU] via SUBCUTANEOUS
  Administered 2023-09-15 – 2023-09-17 (×2): 1 [IU] via SUBCUTANEOUS
  Administered 2023-09-18: 3 [IU] via SUBCUTANEOUS
  Administered 2023-09-18 (×2): 2 [IU] via SUBCUTANEOUS
  Administered 2023-09-19: 1 [IU] via SUBCUTANEOUS
  Administered 2023-09-19: 2 [IU] via SUBCUTANEOUS
  Administered 2023-09-20 (×2): 1 [IU] via SUBCUTANEOUS
  Administered 2023-09-20: 2 [IU] via SUBCUTANEOUS
  Administered 2023-09-21: 1 [IU] via SUBCUTANEOUS
  Administered 2023-09-22: 3 [IU] via SUBCUTANEOUS
  Administered 2023-09-23 (×2): 2 [IU] via SUBCUTANEOUS
  Administered 2023-09-24: 3 [IU] via SUBCUTANEOUS
  Administered 2023-09-24: 1 [IU] via SUBCUTANEOUS
  Filled 2023-09-13 (×17): qty 1

## 2023-09-13 NOTE — ED Notes (Signed)
Clyde Lundborg, MD notified of need for prn dextrose due to patient inability to safely swallow and BGL being 72.

## 2023-09-13 NOTE — ED Notes (Signed)
Patient taken to CT and back to room without incident, VSS, CCM in use, call light within reach. Family at bedside.

## 2023-09-13 NOTE — H&P (Signed)
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 difficile, recent admission due to VRE-UTI,, who presents with weakness, difficulty speaking, confusion.  Patient is very lethargic, history is very limited. Per report, pt is noted to have generalized weakness for unknown amount of time today. He seems to have difficulty speaking. Pt has mild right-sided weakness from previous stroke.  When I saw patient in ED, patient is very lethargic.  He knows his own name, knows that he is in hospital, but is confused about time.  No active nausea, vomiting, diarrhea noted.  No active respiratory distress, active cough noted.  Does not seem to have pain anywhere.  Not sure if patient has symptoms of UTI.  Of note, patient had a positive urinalysis and positive urine culture for Proteus mirabilis on 12/20, patient was started on cefuroxime by urologist.  Due to concerning for new stroke, ED physician discussed with neurologist, Dr. Amada Jupiter who recommended to get CT angio of head and neck and MRI of brain.  CTA of head and neck is negative for LVO.  MRI of brain is negative for stroke.  Data reviewed independently and ED Course: pt was found to have WBC 9.5, positive urinalysis (hazy appearance, large amount of leukocyte, negative bacteria, WBC> 50).  Stable renal function, potassium 3.4, temperature normal, blood pressure 164/75, heart rate 77, RR 29, oxygen saturation 98% on room air.  CT of head negative.  Patient is admitted to telemetry bed as inpatient.   EKG:  I have personally reviewed.  Sinus rhythm, QTc 412, LAD, poor R wave progression.   Review of Systems: Could not be reviewed accurately due to altered mental status.   Allergy:  Allergies  Allergen Reactions   Dulaglutide     Other reaction(s): upset stomach Other reaction(s): Abdominal Pain, upset stomach Other reaction(s): upset stomach    Empagliflozin Other (See Comments)    Other reaction(s): yeast infection Other reaction(s): Other (See Comments) Other reaction(s): yeast infection    Lisinopril Cough    tachycardia   Nexium [Esomeprazole] Other (See Comments)    Headache   Oxycodone Other (See Comments)    Even 1/2 tablet of 5mg  caused increased confusion - contributory to hospitalization 10/2020 with AMS   Pregabalin Nausea Only and Other (See Comments)    Other reaction(s): MALAISE, SLIGHT NAUSEA, SLIGHT HEADACHE Other reaction(s): MALAISE, SLIGHT NAUSEA, SLIGHT HEADACHE    Rosuvastatin Other (See Comments)    Other reaction(s): muscle pain 2.16.2022 Pt is current on this medication without any issues. Other reaction(s): muscle pain 2.16.2022 Pt is current on this medication without any issues. Other reaction(s): muscle pain    Past Medical History:  Diagnosis Date   Anemia    Basal cell carcinoma 2018   R temple    Basal cell carcinoma of nose 2002   bridge of nose   BPH with obstruction/lower urinary tract symptoms    failed flomax, uroxatral, myrbetriq - sees urology Patsi Sears) pending videourodynamics   CKD (chronic kidney disease) stage 3, GFR 30-59 ml/min (HCC) 03/11/2012   Renal US 02/2014 - multiple kidney cysts  CVA (cerebral infarction) 03/07/2012   slurred speech; right hemplegia, left handed - completed PT 07/2014 (17 sessions)   Dehydration    Depression    mild, on cymbalta per prior PCP   Erectile dysfunction    per urology (prostaglandin injection)   GERD (gastroesophageal reflux disease)    with sliding HH   H/O hiatal hernia     History of kidney problems 12/2010   lacerated left kidney after fall   HLD (hyperlipidemia)    Hx of basal cell carcinoma 07/10/2017   R. lat. forehead near hair line   Kidney cysts 02/2014   bilateral (renal US)   Migraines 03/11/2012   now rare   OSA on CPAP    sleep study 01/2014 - severe, CPAP at 10, previously on nuvigil (Dr. Gareth Morgan)   Palpitations 01/2012   holter WNL, rare PAC/PVCs   Presbycusis of both ears 2016   rec amplification [facility])   Restless leg syndrome    well controlled on mirapex   Rosacea    Gwen Pounds   TIA (transient ischemic attack)    Type 2 diabetes, controlled, with neuropathy (HCC) 2011   Dr Sharl Ma in [facility]    Past Surgical History:  Procedure Laterality Date   ABI  09/18/2005   WNL   AMPUTATION TOE Left 03/02/2023   Procedure: 09811 - TOE INTERPHALANGEAL JOINT - SECOND;  Surgeon: Rosetta Posner, DPM;  Location: [facility] ORS;  Service: Podiatry;  Laterality: Left;   CARDIAC CATHETERIZATION  09/18/2002   normal; [place] [facility]   CARDIOVASCULAR STRESS TEST  02/16/2014   WNL, low risk scan, EF 68%   CAROTID ENDARTERECTOMY     pt denies 02/27/23   CATARACT EXTRACTION W/ INTRAOCULAR LENS IMPLANT  11//2012 - 08/2011   COLONOSCOPY  03/19/2011   poor prep, unable to biopsy polyp in tic fold, rpt 3 mo Pearlean Brownie)   COLONOSCOPY  06/19/2011   mult polyps adenomatous, severe diverticulosis, rpt 3 yrs Pearlean Brownie)   CYSTOSCOPY WITH URETHRAL DILATATION N/A 07/24/2013   Procedure: CYSTOSCOPY WITH URETHRAL DILATATION;  Surgeon: Kathi Ludwig, MD;  Location: [facility] ORS;  Service: Urology;  Laterality: N/A;   ESOPHAGOGASTRODUODENOSCOPY  09/18/2004   duodenitis, medual HH, Grade 2 reflux, mild gastritis   ESOPHAGOGASTRODUODENOSCOPY  07/20/2011   mild chronic gastritis   HEMORRHOID SURGERY  09/18/1989   INGUINAL HERNIA REPAIR  1981; 2002   left, then repaired   PROSTATE SURGERY  02/16/2009   PVP (laser)   SKIN CANCER EXCISION  09/18/2000   bridge of nose, [facility]    TONSILLECTOMY AND ADENOIDECTOMY  ~ 1945   TRANSURETHRAL INCISION OF PROSTATE N/A 07/24/2013   Procedure: TRANSURETHRAL INCISION OF THE PROSTATE (TUIP);  Surgeon: Kathi Ludwig, MD;  Location: [facility] ORS;  Service: Urology;  Laterality: N/A;   URETHRAL DILATION  09/18/2012   tannenbaum    Social History:  reports that he has never smoked. He has never been exposed to tobacco smoke. He has never used smokeless tobacco. He reports that he does not currently use alcohol after a past usage of about 4.0 standard drinks of alcohol per week. He reports that he does not use drugs.  Family History:  Family History  Problem Relation Age of Onset   Stroke Father    Lung cancer Father 83        smoker   Alcoholism Sister    Kidney disease Sister    Stroke Brother    Diabetes Brother 6   Lung cancer Brother  Coronary artery disease Neg Hx    Sleep apnea Neg Hx      Prior to Admission medications   Medication Sig Start Date End Date Taking? Authorizing Provider  Alpha-Lipoic Acid 600 MG CAPS Take 1 capsule (600 mg total) by mouth daily. 06/28/21   Eustaquio Boyden, MD  brimonidine-timolol (COMBIGAN) 0.2-0.5 % ophthalmic solution Place 1 drop into both eyes every 12 (twelve) hours.    [provider]  clopidogrel (PLAVIX) 75 MG tablet TAKE 1 TABLET DAILY 04/18/23   Eustaquio Boyden, MD  Coenzyme Q10 (COQ10) 150 MG CAPS Take 1 capsule by mouth daily.    [provider]  DULoxetine (CYMBALTA) 60 MG capsule TAKE 1 CAPSULE DAILY Patient taking differently: Take 60 mg by mouth at bedtime. 08/29/22   Eustaquio Boyden, MD  famotidine (PEPCID) 20 MG tablet Take 1 tablet (20 mg total) by mouth at bedtime. 04/05/21   Kathlen Mody, MD  gabapentin (NEURONTIN) 300 MG capsule Take 2 capsules (600 mg total) by mouth 3 (three) times daily. Patient taking differently: Take 600 mg by mouth 3 (three) times daily. Takes 2 capsules four times a day 04/18/23   Eustaquio Boyden, MD  insulin  aspart (NOVOLOG) 100 UNIT/ML injection CBG 70 - 120: 0 units  CBG 121 - 150: 1 unit  CBG 151 - 200: 2 units  CBG 201 - 250: 3 units  CBG 251 - 300: 5 units  CBG 301 - 350: 7 units  CBG 351 - 400: 9 units 04/05/21   Kathlen Mody, MD  insulin degludec (TRESIBA FLEXTOUCH) 200 UNIT/ML FlexTouch Pen Inject 90 Units into the skin daily. 08/15/23   Eustaquio Boyden, MD  Insulin Pen Needle (ULTICARE SHORT PEN NEEDLES) 31G X 8 MM MISC USE TO INJECT MEDICATION DAILY 12/18/22   Eustaquio Boyden, MD  levothyroxine (SYNTHROID) 50 MCG tablet Take 1 tablet (50 mcg total) by mouth daily before breakfast. Per endo Dr Sharl Ma 12/07/19   Eustaquio Boyden, MD  nystatin cream (MYCOSTATIN) Apply 1 Application topically 2 (two) times daily. Apply to affected area 07/22/23   Charlynne Pander, MD  ondansetron (ZOFRAN) 4 MG tablet Take 1 tablet (4 mg total) by mouth daily as needed for nausea or vomiting. 05/24/23   Eustaquio Boyden, MD  pramipexole (MIRAPEX) 1 MG tablet TAKE 1 TABLET DAILY AT 4 P.M. 09/13/22   Eustaquio Boyden, MD  Probiotic Product (PROBIOTIC PO) Take by mouth in the morning and at bedtime. Takes 2 capsules twice a day    [provider]  RABEprazole (ACIPHEX) 20 MG tablet Take 1 tablet (20 mg total) by mouth daily. 09/08/22   Eustaquio Boyden, MD  rosuvastatin (CRESTOR) 40 MG tablet TAKE 1 TABLET DAILY Patient taking differently: Take 40 mg by mouth at bedtime. 09/13/22   Eustaquio Boyden, MD  Semaglutide, 1 MG/DOSE, 4 MG/3ML SOPN Inject 1 mg into the skin once a week. 08/15/23   Eustaquio Boyden, MD  sennosides-docusate sodium (SENOKOT-S) 8.6-50 MG tablet Take 1 tablet by mouth daily as needed for constipation. 08/13/23   Darlin Drop, DO  sodium chloride (MURO 128) 5 % ophthalmic solution 1 drop as needed for eye irritation.    [provider]  tamsulosin (FLOMAX) 0.4 MG CAPS capsule Take 1 capsule (0.4 mg total) by mouth daily after supper. 08/13/23   Darlin Drop, DO   vitamin C (ASCORBIC ACID) 500 MG tablet Take 1,000 mg by mouth daily as needed.    [provider]    Physical Exam:  Vitals:   09/13/23 2100 09/13/23 2130 09/13/23 2200 09/13/23 2320  BP: (!) 160/79 (!) 164/67 (!) 164/75 (!) 188/65  Pulse: 72 68 64 65  Resp: (!) 29 13 13 19   Temp:    97.9 F (36.6 C)  TempSrc:    Axillary  SpO2: 97% 98% 98% 100%   General: Not in acute distress HEENT:       Eyes: PERRL, EOMI, no jaundice       ENT: No discharge from the ears and nose.       Neck: No JVD, no bruit, no mass felt. Heme: No neck lymph node enlargement. Cardiac: S1/S2, RRR, No murmurs, No gallops or rubs. Respiratory: No rales, wheezing, rhonchi or rubs. GI: Soft, nondistended, nontender, no organomegaly, BS present. GU: No hematuria Ext: Has 1+ right leg edema, no edema in left leg Musculoskeletal: No joint deformities, No joint redness or warmth, no limitation of ROM in spin. Skin: No rashes.  Neuro: Very lethargic, knows his own name, knows that he is in hospital, not orientated to the time. Cranial nerves II-XII grossly intact. Has mild right sided weakness.  Psych: Patient is not psychotic, no suicidal or hemocidal ideation.  Labs on Admission: I have personally reviewed following labs and imaging studies  CBC: Recent Labs  Lab 09/13/23 1809  WBC 9.5  NEUTROABS 5.7  HGB 12.9*  HCT 39.5  MCV 92.9  PLT 215   Basic Metabolic Panel: Recent Labs  Lab 09/13/23 1809  NA 138  K 3.4*  CL 106  CO2 22  GLUCOSE 95  BUN 27*  CREATININE 1.65*  CALCIUM 8.9  MG 2.1   GFR: Estimated Creatinine Clearance: 29.6 mL/min (A) (by C-G formula based on SCr of 1.65 mg/dL (H)). Liver Function Tests: Recent Labs  Lab 09/13/23 1809  AST 21  ALT 16  ALKPHOS 44  BILITOT 0.5  PROT 7.5  ALBUMIN 3.4*   No results for input(s): "LIPASE", "AMYLASE" in the last 168 hours. No results for input(s): "AMMONIA" in the last 168 hours. Coagulation Profile: Recent Labs  Lab  09/13/23 1809  INR 1.2   Cardiac Enzymes: No results for input(s): "CKTOTAL", "CKMB", "CKMBINDEX", "TROPONINI" in the last 168 hours. BNP (last 3 results) Recent Labs    06/22/23 1320  PROBNP 33.0   HbA1C: No results for input(s): "HGBA1C" in the last 72 hours. CBG: Recent Labs  Lab 09/13/23 1802 09/13/23 2326  GLUCAP 75 72   Lipid Profile: No results for input(s): "CHOL", "HDL", "LDLCALC", "TRIG", "CHOLHDL", "LDLDIRECT" in the last 72 hours. Thyroid Function Tests: No results for input(s): "TSH", "T4TOTAL", "FREET4", "T3FREE", "THYROIDAB" in the last 72 hours. Anemia Panel: No results for input(s): "VITAMINB12", "FOLATE", "FERRITIN", "TIBC", "IRON", "RETICCTPCT" in the last 72 hours. Urine analysis:    Component Value Date/Time   COLORURINE YELLOW (A) 09/13/2023 2322   APPEARANCEUR HAZY (A) 09/13/2023 2322   APPEARANCEUR Cloudy (A) 09/07/2023 1339   LABSPEC 1.034 (H) 09/13/2023 2322   PHURINE 5.0 09/13/2023 2322   GLUCOSEU NEGATIVE 09/13/2023 2322   GLUCOSEU >=1000 (A) 10/17/2021 1509   HGBUR SMALL (A) 09/13/2023 2322   BILIRUBINUR NEGATIVE 09/13/2023 2322   BILIRUBINUR Negative 09/07/2023 1339   KETONESUR NEGATIVE 09/13/2023 2322   PROTEINUR 100 (A) 09/13/2023 2322   UROBILINOGEN 0.2 06/22/2023 1307   UROBILINOGEN 0.2 10/17/2021 1509   NITRITE NEGATIVE 09/13/2023 2322   LEUKOCYTESUR LARGE (A) 09/13/2023 2322   Sepsis Labs: @LABRCNTIP (procalcitonin:4,lacticidven:4) ) Recent Results (from the past 240 hours)  Microscopic Examination  Status: Abnormal   Collection Time: 09/07/23  1:39 PM   Urine  Result Value Ref Range Status   WBC, UA >30 (A) 0 - 5 /hpf Final   RBC, Urine 11-30 (A) 0 - 2 /hpf Final   Epithelial Cells (non renal) 0-10 0 - 10 /hpf Final   Bacteria, UA Many (A) None seen/Few Final  CULTURE, URINE COMPREHENSIVE     Status: Abnormal   Collection Time: 09/07/23  2:39 PM   Specimen: Urine   UR  Result Value Ref Range Status   Urine Culture,  Comprehensive Final report (A)  Final   Organism ID, Bacteria Proteus mirabilis (A)  Final    Comment: Multi-Drug Resistant Organism Cefazolin <=4 ug/mL Cefazolin with an MIC <=16 predicts susceptibility to the oral agents cefaclor, cefdinir, cefpodoxime, cefprozil, cefuroxime, cephalexin, and loracarbef when used for therapy of uncomplicated urinary tract infections due to E. coli, Klebsiella pneumoniae, and Proteus mirabilis. Greater than 100,000 colony forming units per mL    Organism ID, Bacteria Comment  Final    Comment: Mixed urogenital flora 25,000-50,000 colony forming units per mL    ANTIMICROBIAL SUSCEPTIBILITY Comment  Final    Comment:       ** S = Susceptible; I = Intermediate; R = Resistant **                    P = Positive; N = Negative             MICS are expressed in micrograms per mL    Antibiotic                 RSLT#1    RSLT#2    RSLT#3    RSLT#4 Amoxicillin/Clavulanic Acid    S Ampicillin                     S Cefepime                       S Ceftriaxone                    I Cefuroxime                     S Ciprofloxacin                  S Ertapenem                      S Gentamicin                     S Levofloxacin                   S Meropenem                      S Nitrofurantoin                 R Piperacillin/Tazobactam        S Tetracycline                   R Tobramycin                     S Trimethoprim/Sulfa             S      Radiological Exams on Admission: MR BRAIN WO CONTRAST Result Date: 09/14/2023 CLINICAL DATA:  Generalized weakness, speech changes EXAM: MRI HEAD WITHOUT CONTRAST TECHNIQUE: Multiplanar, multiecho pulse sequences of the brain and surrounding structures were obtained without intravenous contrast. COMPARISON:  MRI head 06/12/2023, CT head 09/13/2023 FINDINGS: Evaluation is limited by motion affecting several sequences. Brain: No restricted diffusion to suggest acute or subacute infarct. No acute hemorrhage, mass, mass  effect, or midline shift. No hydrocephalus or extra-axial collection. Pituitary and craniocervical junction within normal limits. Hemosiderin deposition is associated with a remote lacunar infarct in the left posterior lentiform nucleus. No other hemosiderin deposition. Confluent T2 hyperintense signal in the periventricular white matter and pons, likely the sequela of moderate chronic small vessel ischemic disease. Vascular: Normal arterial flow voids. Skull and upper cervical spine: Normal marrow signal. Sinuses/Orbits: Clear paranasal sinuses. Status post bilateral lens replacements. Other: The mastoid air cells are well aerated. IMPRESSION: No acute intracranial process. No evidence of acute or subacute infarct. Electronically Signed   By: Wiliam Ke M.D.   On: 09/14/2023 00:41   CT ANGIO HEAD NECK W WO CM W PERF Result Date: 09/13/2023 CLINICAL DATA:  Stroke suspected, speech changes EXAM: CT ANGIOGRAPHY HEAD AND NECK CT PERFUSION BRAIN TECHNIQUE: Multidetector CT imaging of the head and neck was performed using the standard protocol during bolus administration of intravenous contrast. Multiplanar CT image reconstructions and MIPs were obtained to evaluate the vascular anatomy. Carotid stenosis measurements (when applicable) are obtained utilizing NASCET criteria, using the distal internal carotid diameter as the denominator. Multiphase CT imaging of the brain was performed following IV bolus contrast injection. Subsequent parametric perfusion maps were calculated using RAPID software. RADIATION DOSE REDUCTION: This exam was performed according to the departmental dose-optimization program which includes automated exposure control, adjustment of the mA and/or kV according to patient size and/or use of iterative reconstruction technique. CONTRAST:  OMNIPAQUE IOHEXOL 350 MG/ML SOLN COMPARISON:  09/13/2023 CT head FINDINGS: CT HEAD FINDINGS For noncontrast findings, please see same day CT head. CTA  NECK FINDINGS Aortic arch: Standard branching. Imaged portion shows no evidence of aneurysm or dissection. No significant stenosis of the major arch vessel origins. Mild aortic atherosclerosis. Right carotid system: No evidence of dissection, occlusion, or hemodynamically significant stenosis (greater than 50%). Left carotid system: No evidence of dissection, occlusion, or hemodynamically significant stenosis (greater than 50%). Vertebral arteries: No evidence of dissection, occlusion, or hemodynamically significant stenosis (greater than 50%). With calcifications but Skeleton: No acute osseous abnormality. Degenerative changes in the cervical spine. Other neck: No acute finding. Upper chest: No focal pulmonary opacity or pleural effusion. Review of the MIP images confirms the above findings CTA HEAD FINDINGS Anterior circulation: Both internal carotid arteries are patent to the termini, without significant stenosis. A1 segments patent. Normal anterior communicating artery. Anterior cerebral arteries are patent to their distal aspects without significant stenosis. Mild stenosis in the proximal left M1 (series 6, image 95). No significant stenosis in the right M1. In MCA branches perfused to their distal aspects without significant stenosis. Posterior circulation: Vertebral arteries patent to the vertebrobasilar junction without significant stenosis. Posterior inferior cerebellar arteries patent proximally. Basilar patent to its distal aspect without significant stenosis. Superior cerebellar arteries patent proximally. Mild stenosis in the left P1 (series 6, image 90) and right P1 (series 6, image 89). Additional mild stenosis in the proximal right P 2 (series 6, image 91), moderate to severe stenosis in the mid to distal right P2 (series 6, image 99), and severe stenosis in the more medial right P3 (series 6, image 105).  Mild stenosis in the proximal left P 2 (series 6, image 101), and severe stenosis in the distal  left P2 extending into left P3 (series 6, images 102-105). The bilateral posterior communicating arteries are not visualized. Venous sinuses: As permitted by contrast timing, patent. Anatomic variants: None significant. No evidence of aneurysm or vascular malformation. Review of the MIP images confirms the above findings CT Brain Perfusion Findings: CBF (<30%) Volume: 0mL Perfusion (Tmax>6.0s) volume: 0mL Mismatch Volume: 0mL Infarction Location:None IMPRESSION: 1. No intracranial large vessel occlusion. 2. Multifocal stenosis in the bilateral PCAs, some of which is severe. 3. Mild stenosis in the proximal left M1. 4. No hemodynamically significant stenosis in the neck. 5. No evidence of infarct core or penumbra on CT perfusion. 6. Aortic atherosclerosis. Aortic Atherosclerosis (ICD10-I70.0). Electronically Signed   By: Wiliam Ke M.D.   On: 09/13/2023 22:23   CT HEAD WO CONTRAST Result Date: 09/13/2023 CLINICAL DATA:  Neurologic deficit.  Concern for stroke. EXAM: CT HEAD WITHOUT CONTRAST TECHNIQUE: Contiguous axial images were obtained from the base of the skull through the vertex without intravenous contrast. RADIATION DOSE REDUCTION: This exam was performed according to the departmental dose-optimization program which includes automated exposure control, adjustment of the mA and/or kV according to patient size and/or use of iterative reconstruction technique. COMPARISON:  Head CT dated 07/22/2023. FINDINGS: Brain: Mild age-related atrophy and moderate chronic microvascular ischemic changes. Left basal ganglia old lacunar infarct. There is no acute intracranial hemorrhage. No mass effect or midline shift. No extra-axial fluid collection. Vascular: No hyperdense vessel or unexpected calcification. Skull: Normal. Negative for fracture or focal lesion. Sinuses/Orbits: The visualized paranasal sinuses and mastoid air cells are clear. Other: None IMPRESSION: 1. No acute intracranial pathology. 2. Mild  age-related atrophy and moderate chronic microvascular ischemic changes. Left basal ganglia old lacunar infarct. Electronically Signed   By: Elgie Collard M.D.   On: 09/13/2023 18:52      Assessment/Plan Principal Problem:   UTI (urinary tract infection) Active Problems:   History of CVA (cerebrovascular accident)   Acute metabolic encephalopathy   Hyperlipidemia associated with type 2 diabetes mellitus (HCC)   HTN (hypertension)   Type II diabetes mellitus with renal manifestations (HCC)   Chronic diastolic CHF (congestive heart failure) (HCC)   Chronic kidney disease, stage 3b (HCC)   Hypothyroidism   Restless leg syndrome, severe   Right leg swelling   BPH with obstruction/lower urinary tract symptoms   OSA on CPAP   Obesity (BMI 30-39.9)   Assessment and Plan:   UTI (urinary tract infection): pt recently had VRE-UTI. He has been treated with cefuroxime for Proteus mirabilis UTI recently, but still has positive urinalysis.  -Admit to telemetry bed as inpatient -Start Zyvox -Follow-up urine culture  History of CVA (cerebrovascular accident) -Crestor and Plavix  Acute metabolic encephalopathy: Likely due to UTI.  MRI of the brain negative for new stroke. -Fall precaution -Frequent neurocheck  Hyperlipidemia associated with type 2 diabetes mellitus (HCC) -Crestor  HTN (hypertension) -IV hydralazine as needed  Type II diabetes mellitus with renal manifestations [facility]): Recent A1c 6.7, well-controlled.  Patient taking NovoLog, semaglutide, Evaristo Bury.  Blood sugar 75 -Hold long-lasting insulin -Sliding scale insulin  Chronic diastolic CHF (congestive heart failure) (HCC): 2D echo on 01/02/2022 showed EF of 55-60%.  BNP normal 29.5.  CHF seem to be compensated. -Watch volume status closely  Chronic kidney disease, stage 3b [facility]): Renal function stable.  Recent baseline creatinine 1.4-1.9.  His creatinine is 1.65, BUN 29, GFR  39 today. -Follow up with  BMP  Hypothyroidism -Synthroid  Restless leg syndrome, severe -Pramipexole  Right leg swelling: Patient has asymmetric right leg edema. -f/u right Lower extremity venous Doppler to rule out DVT  BPH with obstruction/lower urinary tract symptoms -Flomax  OSA - on CPAP  Obesity (BMI 30-39.9): Body weight 83.5 kg, BMI 30.62 -When patient mental status improves, will need to encourage losing weight. -Patient need exercise and healthy diet       DVT ppx: SQ Lovenox  Code Status: Full code    Family Communication: not done, no family member is at bed side.    Disposition Plan:  Anticipate discharge back to previous environment  Consults called: ED physician discussed with Dr. Amada Jupiter of neurology  Admission status and Level of care: Telemetry Medical:   as inpt      Dispo: The patient is from:  Independent living facility              Anticipated d/c is to:  Independent living facility              Anticipated d/c date is: 2 days              Patient currently is not medically stable to d/c.    Severity of Illness:  The appropriate patient status for this patient is INPATIENT. Inpatient status is judged to be reasonable and necessary in order to provide the required intensity of service to ensure the patient's safety. The patient's presenting symptoms, physical exam findings, and initial radiographic and laboratory data in the context of their chronic comorbidities is felt to place them at high risk for further clinical deterioration. Furthermore, it is not anticipated that the patient will be medically stable for discharge from the hospital within 2 midnights of admission.   * I certify that at the point of admission it is my clinical judgment that the patient will require inpatient hospital care spanning beyond 2 midnights from the point of admission due to high intensity of service, high risk for further deterioration and high frequency of surveillance  required.*       Date of Service 09/14/2023    Lorretta Harp [facility]   If 7PM-7AM, please contact night-coverage [email or web address] 09/14/2023, 1:11 AM

## 2023-09-13 NOTE — ED Notes (Signed)
Attempted to take patient for CTA, was then informed patient needed 18G IV access. Dr. Rosalia Hammers notified for need for better access for perf study.

## 2023-09-13 NOTE — ED Notes (Signed)
Patient taken for MRI

## 2023-09-13 NOTE — ED Triage Notes (Signed)
Pt from [facility]. 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 Pt has hx of stroke with right side deficit. Pt states he's had generalized weakness for unknown amount of time. Pt states his speech changed around 4p today but has uncertain hx of events. Pt takes Plavix. MD Derrill Kay notified. Pt says he has a UTI and is taking ABX

## 2023-09-13 NOTE — ED Provider Notes (Signed)
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  In triage reported generalized weakness for unknown amount of time.  At the time of my initial evaluation reports that around 1 or 2 PM today he had onset of difficulty with his speech that worsened a few hours later.  Ongoing right-sided deficits, says these are not acutely worse.  Additionally reports that he is on an antibiotic for UTI, per documentation started on 12/18.      Physical Exam   Triage Vital Signs: ED Triage Vitals  Encounter Vitals Group     BP 09/13/23 1757 117/62     Systolic BP Percentile --      Diastolic BP Percentile --      Pulse Rate 09/13/23 1757 64     Resp 09/13/23 1757 16     Temp 09/13/23 1757 98.1 F (36.7 C)     Temp Source 09/13/23 1757 Oral     SpO2 09/13/23 1757 100 %     Weight --      Height --      Head Circumference --      Peak Flow --      Pain Score 09/13/23 1759 0     Pain Loc --      Pain Education --      Exclude from Growth Chart --     Most recent vital signs: Vitals:   09/13/23 2130 09/13/23 2200  BP: (!) 164/67 (!) 164/75  Pulse: 68 64  Resp: 13 13  Temp:    SpO2: 98% 98%     General: Awake, interactive  CV:  Regular rate, good peripheral perfusion.  Resp:  Unlabored respirations, lungs clear to auscultation Abd:  Nondistended.  Neuro:  Keenly aware, correctly answers month and age, able to blink eyes and squeeze hands, normal horizontal extraocular movements, reports decreased but intact vision over left visual field, right sided facial droop, unchanged per patient, no drift of left arm, right arm has some effort against gravity, no drift of left leg, right  leg drifts but does not hit bed, ataxia present with finger-to-nose of left arm, difficult assessment of remainder of extremities, normal sensation, mild to moderate aphasia, mild to moderate dysarthria, no inattention   ED Results / Procedures / Treatments   Labs (all labs ordered are listed, but only abnormal results are displayed) Labs Reviewed  CBC - Abnormal; Notable for the following components:      Result Value   Hemoglobin 12.9 (*)    All other components within normal limits  COMPREHENSIVE METABOLIC PANEL - Abnormal; Notable for the following components:   Potassium 3.4 (*)    BUN 27 (*)    Creatinine, Ser 1.65 (*)    Albumin 3.4 (*)    GFR, Estimated 39 (*)    All other components within normal limits  PROTIME-INR  APTT  DIFFERENTIAL  ETHANOL  URINALYSIS, W/ REFLEX TO CULTURE (INFECTION SUSPECTED)  CBG MONITORING, ED     EKG EKG independently reviewed interpreted by myself (ER attending) demonstrates:  EKG demonstrates normal sinus rhythm at a rate of 64, PR 204, QRS 98, QTc 412, no acute ST changes  RADIOLOGY Imaging independently reviewed and interpreted by myself demonstrates:  CT head without acute bleed CT angio and perfusion without LVO or acute perfusion defect  PROCEDURES:  Critical Care performed: No  Procedures   MEDICATIONS ORDERED IN ED: Medications  sodium chloride flush (NS) 0.9 % injection 3 mL ( Intravenous Canceled Entry 09/13/23 1911)  sodium chloride 0.9 % bolus 1,000 mL (has no administration in time range)  iohexol (OMNIPAQUE) 350 MG/ML injection 100 mL (100 mLs Intravenous Contrast Given 09/13/23 1952)     IMPRESSION / MDM / ASSESSMENT AND PLAN / ED COURSE  I reviewed the triage vital signs and the nursing notes.  Differential diagnosis includes, but is not limited to, intracranial bleed, stroke, encephalopathy in the setting of UTI, anemia, electrolyte abnormality  Patient's presentation is most consistent with acute  presentation with potential threat to life or bodily function.  89 year old male presenting with weakness, slurred speech.  Vitals stable on presentation.  Patient outside the window for thrombolytics at the time of my initial evaluation.  CT head ordered from triage without acute bleed.  Mild visual deficits and aphasia on exam, in the setting this, will discuss with neurology to see if they recommend code stroke activation for possible large vessel occlusion.   Clinical Course as of 09/13/23 2255  Thu Sep 13, 2023  1912 Case reviewed with Dr. Amada Jupiter with neurology.  With patient's age and deficits, does not recommend code stroke activation, but does recommend proceeding with CT angiogram and CT perfusion studies which have been ordered. [NR]  2010 Dr. Amada Jupiter reviewed imaging and did not see evidence of perfusion deficit.  Recommends MRI for further evaluation of possible stroke, stroke workup if this is positive.  If MRI negative, will likely need further workup for metabolic or alternative etiologies of deficit. [NR]  2234 Patient has not yet been able to provide urine sample, but urinalysis will already be limited in the setting of recent antibiotic use.  Will require admission, so we will go ahead and reach out to hospitalist team. [NR]  2254 Case reviewed with Dr. Clyde Lundborg.  He will evaluate the patient for anticipated admission. [NR]    Clinical Course User Index [NR] Trinna Post, MD     FINAL CLINICAL IMPRESSION(S) / ED DIAGNOSES   Final diagnoses:  Speech and language deficits  Generalized weakness     Rx / DC Orders   ED Discharge Orders     None        Note:  This document was prepared using Dragon voice recognition software and may include unintentional dictation errors.   Trinna Post, MD 09/13/23 [id number]

## 2023-09-14 ENCOUNTER — Encounter: Payer: Self-pay | Admitting: Internal Medicine

## 2023-09-14 ENCOUNTER — Inpatient Hospital Stay: Payer: Medicare PPO

## 2023-09-14 DIAGNOSIS — G9341 Metabolic encephalopathy: Secondary | ICD-10-CM | POA: Diagnosis present

## 2023-09-14 DIAGNOSIS — N3000 Acute cystitis without hematuria: Secondary | ICD-10-CM | POA: Diagnosis not present

## 2023-09-14 DIAGNOSIS — M7989 Other specified soft tissue disorders: Secondary | ICD-10-CM | POA: Diagnosis not present

## 2023-09-14 LAB — CBC
HCT: 38.9 % — ABNORMAL LOW (ref 39.0–52.0)
Hemoglobin: 12.6 g/dL — ABNORMAL LOW (ref 13.0–17.0)
MCH: 29.9 pg (ref 26.0–34.0)
MCHC: 32.4 g/dL (ref 30.0–36.0)
MCV: 92.4 fL (ref 80.0–100.0)
Platelets: 169 10*3/uL (ref 150–400)
RBC: 4.21 MIL/uL — ABNORMAL LOW (ref 4.22–5.81)
RDW: 13.5 % (ref 11.5–15.5)
WBC: 7.2 10*3/uL (ref 4.0–10.5)
nRBC: 0 % (ref 0.0–0.2)

## 2023-09-14 LAB — CBG MONITORING, ED: Glucose-Capillary: 88 mg/dL (ref 70–99)

## 2023-09-14 LAB — GLUCOSE, CAPILLARY
Glucose-Capillary: 105 mg/dL — ABNORMAL HIGH (ref 70–99)
Glucose-Capillary: 139 mg/dL — ABNORMAL HIGH (ref 70–99)
Glucose-Capillary: 172 mg/dL — ABNORMAL HIGH (ref 70–99)

## 2023-09-14 LAB — PROCALCITONIN: Procalcitonin: <0.1 ng/mL

## 2023-09-14 LAB — BASIC METABOLIC PANEL
Anion gap: 7 (ref 5–15)
BUN: 22 mg/dL (ref 8–23)
CO2: 23 mmol/L (ref 22–32)
Calcium: 8.2 mg/dL — ABNORMAL LOW (ref 8.9–10.3)
Chloride: 108 mmol/L (ref 98–111)
Creatinine, Ser: 1.39 mg/dL — ABNORMAL HIGH (ref 0.61–1.24)
GFR, Estimated: 48 mL/min — ABNORMAL LOW (ref >60)
Glucose, Bld: 110 mg/dL — ABNORMAL HIGH (ref 70–99)
Potassium: 3.4 mmol/L — ABNORMAL LOW (ref 3.5–5.1)
Sodium: 138 mmol/L (ref 135–145)

## 2023-09-14 MED ORDER — ENOXAPARIN SODIUM 40 MG/0.4ML IJ SOSY: 40.0000 mg | PREFILLED_SYRINGE | INTRAMUSCULAR | Status: DC

## 2023-09-14 MED ORDER — SACCHAROMYCES BOULARDII 250 MG PO CAPS
500.0000 mg | ORAL_CAPSULE | Freq: Two times a day (BID) | ORAL | Status: DC
  Administered 2023-09-14 – 2023-09-25 (×22): 500 mg via ORAL
  Filled 2023-09-14 (×24): qty 2

## 2023-09-14 MED ORDER — ACETAMINOPHEN 650 MG RE SUPP: 650.0000 mg | Freq: Four times a day (QID) | RECTAL | Status: DC | PRN

## 2023-09-14 MED ORDER — BRIMONIDINE TARTRATE 0.2 % OP SOLN
1.0000 [drp] | Freq: Two times a day (BID) | OPHTHALMIC | Status: DC
  Administered 2023-09-14 – 2023-09-25 (×22): 1 [drp] via OPHTHALMIC
  Filled 2023-09-14: qty 5

## 2023-09-14 MED ORDER — BRIMONIDINE TARTRATE-TIMOLOL 0.2-0.5 % OP SOLN
1.0000 [drp] | Freq: Two times a day (BID) | OPHTHALMIC | Status: DC
  Filled 2023-09-14 (×2): qty 5

## 2023-09-14 MED ORDER — SENNOSIDES-DOCUSATE SODIUM 8.6-50 MG PO TABS
1.0000 | ORAL_TABLET | Freq: Every day | ORAL | Status: DC | PRN
  Administered 2023-09-15 – 2023-09-23 (×2): 1 via ORAL
  Filled 2023-09-14 (×2): qty 1

## 2023-09-14 MED ORDER — PRAMIPEXOLE DIHYDROCHLORIDE 1 MG PO TABS
1.0000 mg | ORAL_TABLET | Freq: Every day | ORAL | Status: DC
  Administered 2023-09-14 – 2023-09-24 (×11): 1 mg via ORAL
  Filled 2023-09-14 (×11): qty 1

## 2023-09-14 MED ORDER — LINEZOLID 600 MG/300ML IV SOLN
600.0000 mg | Freq: Two times a day (BID) | INTRAVENOUS | Status: DC
  Administered 2023-09-14: 600 mg via INTRAVENOUS
  Filled 2023-09-14 (×2): qty 300

## 2023-09-14 MED ORDER — CLOPIDOGREL BISULFATE 75 MG PO TABS
75.0000 mg | ORAL_TABLET | Freq: Every day | ORAL | Status: DC
  Administered 2023-09-14 – 2023-09-25 (×11): 75 mg via ORAL
  Filled 2023-09-14 (×12): qty 1

## 2023-09-14 MED ORDER — TAMSULOSIN HCL 0.4 MG PO CAPS
0.4000 mg | ORAL_CAPSULE | Freq: Every day | ORAL | Status: DC
  Administered 2023-09-14 – 2023-09-24 (×10): 0.4 mg via ORAL
  Filled 2023-09-14 (×10): qty 1

## 2023-09-14 MED ORDER — FAMOTIDINE 20 MG PO TABS
20.0000 mg | ORAL_TABLET | Freq: Every day | ORAL | Status: DC
Start: 2023-09-14 — End: 2023-09-25
  Administered 2023-09-14 – 2023-09-24 (×11): 20 mg via ORAL
  Filled 2023-09-14 (×11): qty 1

## 2023-09-14 MED ORDER — ENOXAPARIN SODIUM 40 MG/0.4ML IJ SOSY
40.0000 mg | PREFILLED_SYRINGE | INTRAMUSCULAR | Status: DC
  Administered 2023-09-14 – 2023-09-25 (×12): 40 mg via SUBCUTANEOUS
  Filled 2023-09-14 (×12): qty 0.4

## 2023-09-14 MED ORDER — ROSUVASTATIN CALCIUM 10 MG PO TABS
40.0000 mg | ORAL_TABLET | Freq: Every day | ORAL | Status: DC
  Administered 2023-09-14 – 2023-09-24 (×10): 40 mg via ORAL
  Filled 2023-09-14 (×5): qty 4
  Filled 2023-09-14: qty 8
  Filled 2023-09-14 (×5): qty 4

## 2023-09-14 MED ORDER — SODIUM CHLORIDE 0.9 % IV SOLN
3.0000 g | Freq: Four times a day (QID) | INTRAVENOUS | Status: DC
  Administered 2023-09-14 – 2023-09-16 (×8): 3 g via INTRAVENOUS
  Filled 2023-09-14 (×10): qty 8

## 2023-09-14 MED ORDER — HYDRALAZINE HCL 20 MG/ML IJ SOLN: 5.0000 mg | INTRAMUSCULAR | Status: DC | PRN

## 2023-09-14 MED ORDER — TIMOLOL MALEATE 0.5 % OP SOLN
1.0000 [drp] | Freq: Two times a day (BID) | OPHTHALMIC | Status: DC
  Administered 2023-09-14 – 2023-09-25 (×22): 1 [drp] via OPHTHALMIC
  Filled 2023-09-14: qty 5

## 2023-09-14 MED ORDER — SODIUM CHLORIDE 0.9 % IV SOLN: INTRAVENOUS | Status: DC

## 2023-09-14 MED ORDER — LEVOTHYROXINE SODIUM 50 MCG PO TABS
50.0000 ug | ORAL_TABLET | Freq: Every day | ORAL | Status: DC
  Administered 2023-09-14 – 2023-09-25 (×10): 50 ug via ORAL
  Filled 2023-09-14 (×12): qty 1

## 2023-09-14 MED ORDER — PANTOPRAZOLE SODIUM 40 MG PO TBEC
40.0000 mg | DELAYED_RELEASE_TABLET | Freq: Every day | ORAL | Status: DC
Start: 2023-09-14 — End: 2023-09-25
  Administered 2023-09-14 – 2023-09-25 (×11): 40 mg via ORAL
  Filled 2023-09-14 (×12): qty 1

## 2023-09-14 MED ORDER — GABAPENTIN 300 MG PO CAPS
600.0000 mg | ORAL_CAPSULE | Freq: Three times a day (TID) | ORAL | Status: DC
  Administered 2023-09-14 – 2023-09-15 (×8): 600 mg via ORAL
  Filled 2023-09-14 (×9): qty 2

## 2023-09-14 MED ORDER — DULOXETINE HCL 30 MG PO CPEP
60.0000 mg | ORAL_CAPSULE | Freq: Every day | ORAL | Status: DC
  Administered 2023-09-14 – 2023-09-24 (×11): 60 mg via ORAL
  Filled 2023-09-14 (×11): qty 2

## 2023-09-14 NOTE — Progress Notes (Signed)
Pt requested RT to come and fix his CPAP. RN called Respiratory Therapist to come and fix pt's CPAP. RT said that she will come and fix pt's CPAP.

## 2023-09-14 NOTE — Care Management Obs Status (Signed)
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

## 2023-09-14 NOTE — Care Management CC44 (Signed)
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

## 2023-09-14 NOTE — Progress Notes (Signed)
PROGRESS NOTE    James Sarks, MD  WCB:[id number] DOB: 17-Sep-1931 DOA: 09/13/2023 PCP: Eustaquio Boyden, MD    Brief Narrative:  89 y.o. male with medical history significant of HLD, DM, dCHF, stroke,/TIA, hypothyroidism, memory loss, dementia, BPH, CKD-3B, obesity, OSA on CPAP, RLS, migraine headache, C. difficile, recent admission due to VRE-UTI,, who presents with weakness, difficulty speaking, confusion.   Patient is very lethargic, history is very limited. Per report, pt is noted to have generalized weakness for unknown amount of time today. He seems to have difficulty speaking. Pt has mild right-sided weakness from previous stroke.  When I saw patient in ED, patient is very lethargic.  He knows his own name, knows that he is in hospital, but is confused about time.  No active nausea, vomiting, diarrhea noted.  No active respiratory distress, active cough noted.  Does not seem to have pain anywhere.  Not sure if patient has symptoms of UTI.   Of note, patient had a positive urinalysis and positive urine culture for Proteus mirabilis on 12/20, patient was started on cefuroxime by urologist.  Due to concerning for new stroke, ED physician discussed with neurologist, Dr. Amada Jupiter who recommended to get CT angio of head and neck and MRI of brain.  CTA of head and neck is negative for LVO.  MRI of brain is negative for stroke.   Assessment & Plan:   Principal Problem:   UTI (urinary tract infection) Active Problems:   History of CVA (cerebrovascular accident)   Acute metabolic encephalopathy   Hyperlipidemia associated with type 2 diabetes mellitus (HCC)   HTN (hypertension)   Type II diabetes mellitus with renal manifestations (HCC)   Chronic diastolic CHF (congestive heart failure) (HCC)   Chronic kidney disease, stage 3b (HCC)   Hypothyroidism   Restless leg syndrome, severe   Right leg swelling   BPH with obstruction/lower urinary tract symptoms   OSA on CPAP   Obesity  (BMI 30-39.9) UTI (urinary tract infection):  pt recently had VRE-UTI. He has been treated with cefuroxime for Proteus mirabilis UTI recently, but still has positive urinalysis. Started on Zyvox on admission Antimicrobial coverage discussed with pharmacy Plan: Switch antibiotic to Unasyn Follow urine culture   History of CVA (cerebrovascular accident) -Crestor and Plavix -Imaging survey negative for acute CVA   Acute metabolic encephalopathy: Likely due to UTI.  MRI of the brain negative for new stroke.  Mental status improving.  Patient alert and oriented x 4 on my evaluation -Fall precaution -Frequent neurocheck   Hyperlipidemia associated with type 2 diabetes mellitus (HCC) -Crestor   HTN (hypertension) -IV hydralazine as needed   Type II diabetes mellitus with renal manifestations [facility]): Recent A1c 6.7, well-controlled.  Patient taking NovoLog, semaglutide, Evaristo Bury.  Blood sugar 75 -Hold long-lasting insulin -Sliding scale insulin   Chronic diastolic CHF (congestive heart failure) (HCC): 2D echo on 01/02/2022 showed EF of 55-60%.  BNP normal 29.5.  CHF seem to be compensated. -Watch volume status closely   Chronic kidney disease, stage 3b [facility]): Renal function stable.  Recent baseline creatinine 1.4-1.9.  His creatinine is 1.65, BUN 29, GFR 39 today. -Follow up with BMP   Hypothyroidism -Synthroid   Restless leg syndrome, severe -Pramipexole   Right leg swelling: Patient has asymmetric right leg edema. -f/u right Lower extremity venous Doppler to rule out DVT   BPH with obstruction/lower urinary tract symptoms -Flomax   OSA - on CPAP   Obesity (BMI 30-39.9): Body weight 83.5 kg, BMI 30.62 -  When patient mental status improves, will need to encourage losing weight. -Patient need exercise and healthy diet        DVT prophylaxis: Lovenox Code Status: Full Family Communication: Daughter Lurena Joiner [phone] on 12/27 Disposition Plan: Status is:  Inpatient Remains inpatient appropriate because: UTI and associated delirium on IV antibiotics   Level of care: Telemetry Medical  Consultants:  None  Procedures:  None  Antimicrobials: Unasyn   Subjective: Seen and examined peer resting in bed.  Appears fatigued otherwise stable.  Alert and oriented x 4. Objective: Vitals:   09/14/23 0630 09/14/23 0909 09/14/23 0948 09/14/23 1200  BP: (!) 177/92  (!) 142/70   Pulse: (!) 58  74   Resp: 15  16   Temp:  (!) 97.4 F (36.3 C) 97.6 F (36.4 C)   TempSrc: Axillary Axillary Oral   SpO2: 99%  97%   Weight:    90 kg  Height:    5\' 8"  (1.727 m)    Intake/Output Summary (Last 24 hours) at 09/14/2023 1502 Last data filed at 09/14/2023 5284 Gross per 24 hour  Intake 1316 ml  Output --  Net 1316 ml   Filed Weights   09/14/23 1200  Weight: 90 kg    Examination:  General exam: Appears calm and comfortable  Respiratory system: Clear to auscultation. Respiratory effort normal. Cardiovascular system: S1-S2, RRR, no murmurs, no pedal edema Gastrointestinal system: Soft, NT/ND, normal bowel sounds Central nervous system: Alert and oriented. No focal neurological deficits. Extremities: Symmetric 5 x 5 power. Skin: No rashes, lesions or ulcers Psychiatry: Judgement and insight appear normal. Mood & affect appropriate.     Data Reviewed: I have personally reviewed following labs and imaging studies  CBC: Recent Labs  Lab 09/13/23 1809 09/14/23 0639  WBC 9.5 7.2  NEUTROABS 5.7  --   HGB 12.9* 12.6*  HCT 39.5 38.9*  MCV 92.9 92.4  PLT 215 169   Basic Metabolic Panel: Recent Labs  Lab 09/13/23 1809 09/14/23 0639  NA 138 138  K 3.4* 3.4*  CL 106 108  CO2 22 23  GLUCOSE 95 110*  BUN 27* 22  CREATININE 1.65* 1.39*  CALCIUM 8.9 8.2*  MG 2.1  --    GFR: Estimated Creatinine Clearance: 38.5 mL/min (A) (by C-G formula based on SCr of 1.39 mg/dL (H)). Liver Function Tests: Recent Labs  Lab 09/13/23 1809  AST  21  ALT 16  ALKPHOS 44  BILITOT 0.5  PROT 7.5  ALBUMIN 3.4*   No results for input(s): "LIPASE", "AMYLASE" in the last 168 hours. No results for input(s): "AMMONIA" in the last 168 hours. Coagulation Profile: Recent Labs  Lab 09/13/23 1809  INR 1.2   Cardiac Enzymes: No results for input(s): "CKTOTAL", "CKMB", "CKMBINDEX", "TROPONINI" in the last 168 hours. BNP (last 3 results) Recent Labs    06/22/23 1320  PROBNP 33.0   HbA1C: No results for input(s): "HGBA1C" in the last 72 hours. CBG: Recent Labs  Lab 09/13/23 1802 09/13/23 2326 09/14/23 0753 09/14/23 1133  GLUCAP 75 72 88 105*   Lipid Profile: No results for input(s): "CHOL", "HDL", "LDLCALC", "TRIG", "CHOLHDL", "LDLDIRECT" in the last 72 hours. Thyroid Function Tests: No results for input(s): "TSH", "T4TOTAL", "FREET4", "T3FREE", "THYROIDAB" in the last 72 hours. Anemia Panel: No results for input(s): "VITAMINB12", "FOLATE", "FERRITIN", "TIBC", "IRON", "RETICCTPCT" in the last 72 hours. Sepsis Labs: Recent Labs  Lab 09/14/23 0639  PROCALCITON <0.10    Recent Results (from the past 240 hours)  Microscopic Examination     Status: Abnormal   Collection Time: 09/07/23  1:39 PM   Urine  Result Value Ref Range Status   WBC, UA >30 (A) 0 - 5 /hpf Final   RBC, Urine 11-30 (A) 0 - 2 /hpf Final   Epithelial Cells (non renal) 0-10 0 - 10 /hpf Final   Bacteria, UA Many (A) None seen/Few Final  CULTURE, URINE COMPREHENSIVE     Status: Abnormal   Collection Time: 09/07/23  2:39 PM   Specimen: Urine   UR  Result Value Ref Range Status   Urine Culture, Comprehensive Final report (A)  Final   Organism ID, Bacteria Proteus mirabilis (A)  Final    Comment: Multi-Drug Resistant Organism Cefazolin <=4 ug/mL Cefazolin with an MIC <=16 predicts susceptibility to the oral agents cefaclor, cefdinir, cefpodoxime, cefprozil, cefuroxime, cephalexin, and loracarbef when used for therapy of uncomplicated urinary  tract infections due to E. coli, Klebsiella pneumoniae, and Proteus mirabilis. Greater than 100,000 colony forming units per mL    Organism ID, Bacteria Comment  Final    Comment: Mixed urogenital flora 25,000-50,000 colony forming units per mL    ANTIMICROBIAL SUSCEPTIBILITY Comment  Final    Comment:       ** S = Susceptible; I = Intermediate; R = Resistant **                    P = Positive; N = Negative             MICS are expressed in micrograms per mL    Antibiotic                 RSLT#1    RSLT#2    RSLT#3    RSLT#4 Amoxicillin/Clavulanic Acid    S Ampicillin                     S Cefepime                       S Ceftriaxone                    I Cefuroxime                     S Ciprofloxacin                  S Ertapenem                      S Gentamicin                     S Levofloxacin                   S Meropenem                      S Nitrofurantoin                 R Piperacillin/Tazobactam        S Tetracycline                   R Tobramycin                     S Trimethoprim/Sulfa             S          Radiology Studies: US  Venous Img Lower Unilateral Right (DVT) Result Date: 09/14/2023 CLINICAL DATA:  Right leg swelling EXAM: RIGHT LOWER EXTREMITY VENOUS DOPPLER ULTRASOUND TECHNIQUE: Gray-scale sonography with compression, as well as color and duplex ultrasound, were performed to evaluate the deep venous system(s) from the level of the common femoral vein through the popliteal and proximal calf veins. COMPARISON:  None Available. FINDINGS: VENOUS Normal compressibility of the common femoral, superficial femoral, and popliteal veins, as well as the visualized calf veins. Visualized portions of profunda femoral vein and great saphenous vein unremarkable. No filling defects to suggest DVT on grayscale or color Doppler imaging. Doppler waveforms show normal direction of venous flow, normal respiratory plasticity and response to augmentation. Limited views of the  contralateral common femoral vein are unremarkable. OTHER None. Limitations: none IMPRESSION: Negative. Electronically Signed   By: Charlett Nose M.D.   On: 09/14/2023 03:07   MR BRAIN WO CONTRAST Result Date: 09/14/2023 CLINICAL DATA:  Generalized weakness, speech changes EXAM: MRI HEAD WITHOUT CONTRAST TECHNIQUE: Multiplanar, multiecho pulse sequences of the brain and surrounding structures were obtained without intravenous contrast. COMPARISON:  MRI head 06/12/2023, CT head 09/13/2023 FINDINGS: Evaluation is limited by motion affecting several sequences. Brain: No restricted diffusion to suggest acute or subacute infarct. No acute hemorrhage, mass, mass effect, or midline shift. No hydrocephalus or extra-axial collection. Pituitary and craniocervical junction within normal limits. Hemosiderin deposition is associated with a remote lacunar infarct in the left posterior lentiform nucleus. No other hemosiderin deposition. Confluent T2 hyperintense signal in the periventricular white matter and pons, likely the sequela of moderate chronic small vessel ischemic disease. Vascular: Normal arterial flow voids. Skull and upper cervical spine: Normal marrow signal. Sinuses/Orbits: Clear paranasal sinuses. Status post bilateral lens replacements. Other: The mastoid air cells are well aerated. IMPRESSION: No acute intracranial process. No evidence of acute or subacute infarct. Electronically Signed   By: Wiliam Ke M.D.   On: 09/14/2023 00:41   CT ANGIO HEAD NECK W WO CM W PERF Result Date: 09/13/2023 CLINICAL DATA:  Stroke suspected, speech changes EXAM: CT ANGIOGRAPHY HEAD AND NECK CT PERFUSION BRAIN TECHNIQUE: Multidetector CT imaging of the head and neck was performed using the standard protocol during bolus administration of intravenous contrast. Multiplanar CT image reconstructions and MIPs were obtained to evaluate the vascular anatomy. Carotid stenosis measurements (when applicable) are obtained utilizing  NASCET criteria, using the distal internal carotid diameter as the denominator. Multiphase CT imaging of the brain was performed following IV bolus contrast injection. Subsequent parametric perfusion maps were calculated using RAPID software. RADIATION DOSE REDUCTION: This exam was performed according to the departmental dose-optimization program which includes automated exposure control, adjustment of the mA and/or kV according to patient size and/or use of iterative reconstruction technique. CONTRAST:  OMNIPAQUE IOHEXOL 350 MG/ML SOLN COMPARISON:  09/13/2023 CT head FINDINGS: CT HEAD FINDINGS For noncontrast findings, please see same day CT head. CTA NECK FINDINGS Aortic arch: Standard branching. Imaged portion shows no evidence of aneurysm or dissection. No significant stenosis of the major arch vessel origins. Mild aortic atherosclerosis. Right carotid system: No evidence of dissection, occlusion, or hemodynamically significant stenosis (greater than 50%). Left carotid system: No evidence of dissection, occlusion, or hemodynamically significant stenosis (greater than 50%). Vertebral arteries: No evidence of dissection, occlusion, or hemodynamically significant stenosis (greater than 50%). With calcifications but Skeleton: No acute osseous abnormality. Degenerative changes in the cervical spine. Other neck: No acute finding. Upper chest: No focal pulmonary opacity or pleural effusion. Review of the  MIP images confirms the above findings CTA HEAD FINDINGS Anterior circulation: Both internal carotid arteries are patent to the termini, without significant stenosis. A1 segments patent. Normal anterior communicating artery. Anterior cerebral arteries are patent to their distal aspects without significant stenosis. Mild stenosis in the proximal left M1 (series 6, image 95). No significant stenosis in the right M1. In MCA branches perfused to their distal aspects without significant stenosis. Posterior  circulation: Vertebral arteries patent to the vertebrobasilar junction without significant stenosis. Posterior inferior cerebellar arteries patent proximally. Basilar patent to its distal aspect without significant stenosis. Superior cerebellar arteries patent proximally. Mild stenosis in the left P1 (series 6, image 90) and right P1 (series 6, image 89). Additional mild stenosis in the proximal right P 2 (series 6, image 91), moderate to severe stenosis in the mid to distal right P2 (series 6, image 99), and severe stenosis in the more medial right P3 (series 6, image 105). Mild stenosis in the proximal left P 2 (series 6, image 101), and severe stenosis in the distal left P2 extending into left P3 (series 6, images 102-105). The bilateral posterior communicating arteries are not visualized. Venous sinuses: As permitted by contrast timing, patent. Anatomic variants: None significant. No evidence of aneurysm or vascular malformation. Review of the MIP images confirms the above findings CT Brain Perfusion Findings: CBF (<30%) Volume: 0mL Perfusion (Tmax>6.0s) volume: 0mL Mismatch Volume: 0mL Infarction Location:None IMPRESSION: 1. No intracranial large vessel occlusion. 2. Multifocal stenosis in the bilateral PCAs, some of which is severe. 3. Mild stenosis in the proximal left M1. 4. No hemodynamically significant stenosis in the neck. 5. No evidence of infarct core or penumbra on CT perfusion. 6. Aortic atherosclerosis. Aortic Atherosclerosis (ICD10-I70.0). Electronically Signed   By: Wiliam Ke M.D.   On: 09/13/2023 22:23   CT HEAD WO CONTRAST Result Date: 09/13/2023 CLINICAL DATA:  Neurologic deficit.  Concern for stroke. EXAM: CT HEAD WITHOUT CONTRAST TECHNIQUE: Contiguous axial images were obtained from the base of the skull through the vertex without intravenous contrast. RADIATION DOSE REDUCTION: This exam was performed according to the departmental dose-optimization program which includes automated  exposure control, adjustment of the mA and/or kV according to patient size and/or use of iterative reconstruction technique. COMPARISON:  Head CT dated 07/22/2023. FINDINGS: Brain: Mild age-related atrophy and moderate chronic microvascular ischemic changes. Left basal ganglia old lacunar infarct. There is no acute intracranial hemorrhage. No mass effect or midline shift. No extra-axial fluid collection. Vascular: No hyperdense vessel or unexpected calcification. Skull: Normal. Negative for fracture or focal lesion. Sinuses/Orbits: The visualized paranasal sinuses and mastoid air cells are clear. Other: None IMPRESSION: 1. No acute intracranial pathology. 2. Mild age-related atrophy and moderate chronic microvascular ischemic changes. Left basal ganglia old lacunar infarct. Electronically Signed   By: Elgie Collard M.D.   On: 09/13/2023 18:52        Scheduled Meds:  brimonidine  1 drop Both Eyes BID   And   timolol  1 drop Both Eyes BID   clopidogrel  75 mg Oral Daily   DULoxetine  60 mg Oral QHS   enoxaparin (LOVENOX) injection  40 mg Subcutaneous Q24H   famotidine  20 mg Oral QHS   gabapentin  600 mg Oral TID WC & HS   insulin aspart  0-5 Units Subcutaneous QHS   insulin aspart  0-9 Units Subcutaneous TID WC   levothyroxine  50 mcg Oral Q0600   pantoprazole  40 mg Oral Daily   potassium  chloride  40 mEq Oral Once   pramipexole  1 mg Oral QHS   rosuvastatin  40 mg Oral QHS   saccharomyces boulardii  500 mg Oral BID   sodium chloride flush  3 mL Intravenous Once   tamsulosin  0.4 mg Oral QPC supper   Continuous Infusions:  sodium chloride 75 mL/hr at 09/14/23 0653   ampicillin-sulbactam (UNASYN) IV 3 g (09/14/23 1410)     LOS: 0 days    Tresa Moore, MD [facility]   If 7PM-7AM, please contact night-coverage  09/14/2023, 3:02 PM

## 2023-09-15 ENCOUNTER — Observation Stay: Payer: Medicare PPO

## 2023-09-15 DIAGNOSIS — I700 Atherosclerosis of aorta: Secondary | ICD-10-CM | POA: Diagnosis not present

## 2023-09-15 DIAGNOSIS — N3000 Acute cystitis without hematuria: Secondary | ICD-10-CM | POA: Diagnosis not present

## 2023-09-15 DIAGNOSIS — U071 COVID-19: Secondary | ICD-10-CM | POA: Diagnosis not present

## 2023-09-15 DIAGNOSIS — R059 Cough, unspecified: Secondary | ICD-10-CM | POA: Diagnosis not present

## 2023-09-15 LAB — URINALYSIS, COMPLETE (UACMP) WITH MICROSCOPIC
Bilirubin Urine: NEGATIVE
Glucose, UA: NEGATIVE mg/dL
Ketones, ur: NEGATIVE mg/dL
Nitrite: NEGATIVE
Protein, ur: 100 mg/dL — AB
Specific Gravity, Urine: 1.017 (ref 1.005–1.030)
WBC, UA: >50 WBC/hpf (ref 0–5)
pH: 5 (ref 5.0–8.0)

## 2023-09-15 LAB — GLUCOSE, CAPILLARY
Glucose-Capillary: 135 mg/dL — ABNORMAL HIGH (ref 70–99)
Glucose-Capillary: 129 mg/dL — ABNORMAL HIGH (ref 70–99)
Glucose-Capillary: 153 mg/dL — ABNORMAL HIGH (ref 70–99)
Glucose-Capillary: 246 mg/dL — ABNORMAL HIGH (ref 70–99)

## 2023-09-15 LAB — RESPIRATORY PANEL BY PCR

## 2023-09-15 LAB — SARS CORONAVIRUS 2 BY RT PCR: SARS Coronavirus 2 by RT PCR: POSITIVE — AB

## 2023-09-15 MED ORDER — INSULIN GLARGINE-YFGN 100 UNIT/ML ~~LOC~~ SOLN
40.0000 [IU] | Freq: Every day | SUBCUTANEOUS | Status: DC
Start: 2023-09-15 — End: 2023-09-17
  Administered 2023-09-15 – 2023-09-17 (×2): 40 [IU] via SUBCUTANEOUS
  Filled 2023-09-15 (×3): qty 0.4

## 2023-09-15 MED ORDER — ACETAMINOPHEN 325 MG PO TABS
650.0000 mg | ORAL_TABLET | Freq: Once | ORAL | Status: AC
  Administered 2023-09-15: 650 mg via ORAL
  Filled 2023-09-15: qty 2

## 2023-09-15 NOTE — Progress Notes (Signed)
PROGRESS NOTE    [name], MD  XBJ:[id number] DOB: [birth year] DOA: 09/13/2023 PCP: Eustaquio Boyden, MD    Brief Narrative:  89 y.o. male with medical history significant of HLD, DM, dCHF, stroke,/TIA, hypothyroidism, memory loss, dementia, BPH, CKD-3B, obesity, OSA on CPAP, RLS, migraine headache, C. difficile, recent admission due to VRE-UTI,, who presents with weakness, difficulty speaking, confusion.   Patient is very lethargic, history is very limited. Per report, pt is noted to have generalized weakness for unknown amount of time today. He seems to have difficulty speaking. Pt has mild right-sided weakness from previous stroke.  When I saw patient in ED, patient is very lethargic.  He knows his own name, knows that he is in hospital, but is confused about time.  No active nausea, vomiting, diarrhea noted.  No active respiratory distress, active cough noted.  Does not seem to have pain anywhere.  Not sure if patient has symptoms of UTI.   Of note, patient had a positive urinalysis and positive urine culture for Proteus mirabilis on 12/20, patient was started on cefuroxime by urologist.  Due to concerning for new stroke, ED physician discussed with neurologist, Dr. Amada Jupiter who recommended to get CT angio of head and neck and MRI of brain.  CTA of head and neck is negative for LVO.  MRI of brain is negative for stroke.   Assessment & Plan:   Principal Problem:   UTI (urinary tract infection) Active Problems:   History of CVA (cerebrovascular accident)   Acute metabolic encephalopathy   Hyperlipidemia associated with type 2 diabetes mellitus (HCC)   HTN (hypertension)   Type II diabetes mellitus with renal manifestations (HCC)   Chronic diastolic CHF (congestive heart failure) (HCC)   Chronic kidney disease, stage 3b (HCC)   Hypothyroidism   Restless leg syndrome, severe   Right leg swelling   BPH with obstruction/lower urinary tract symptoms   OSA on CPAP   Obesity  (BMI 30-39.9)  UTI (urinary tract infection):  pt recently had VRE-UTI. He has been treated with cefuroxime for Proteus mirabilis UTI recently.  Initial urinalysis done on 12/26 positive for leukocytes but negative for bacteria.  Patient still demonstrating signs and symptoms that could be consistent with possible infectious delirium to repeat urinalysis was performed on 12/28.  Interestingly this shows evidence of bacteria although rare. Plan: Continue Unasyn Follow urine culture Follow repeat urine culture on 12/28   History of CVA (cerebrovascular accident) -Crestor and Plavix -Imaging survey negative for acute CVA   Acute metabolic encephalopathy: Likely due to UTI.  MRI of the brain negative for new stroke.  Mental status overall improved.  Patient alert and oriented x 4.  Daughter feels that speech is more slurred and less intelligible on 12/28.  Etiology unclear.  TIA on differential as this is hypoglycemia and infectious delirium. Plan: -Fall precaution -Frequent neurocheck -Treatment for UTI as above   Hyperlipidemia associated with type 2 diabetes mellitus (HCC) -Crestor   HTN (hypertension) -IV hydralazine as needed   Type II diabetes mellitus with renal manifestations [facility]): Recent A1c 6.7, well-controlled.  Patient taking NovoLog, semaglutide, Evaristo Bury.  Blood sugar 75 -Restart long-acting insulin at 40 units daily (home dose 8 units daily) -Sliding scale insulin   Chronic diastolic CHF (congestive heart failure) (HCC): 2D echo on 01/02/2022 showed EF of 55-60%.  BNP normal 29.5.  CHF seem to be compensated. -Watch volume status closely   Chronic kidney disease, stage 3b [facility]): Renal function stable.  Recent baseline  creatinine 1.4-1.9.  His creatinine is 1.65, BUN 29, GFR 39 today. -Follow up with BMP   Hypothyroidism -Synthroid   Restless leg syndrome, severe -Pramipexole   Right leg swelling: Patient has asymmetric right leg edema. -Lower extremity venous  Doppler negative for VTE   BPH with obstruction/lower urinary tract symptoms -Flomax   OSA - on CPAP   Obesity (BMI 30-39.9): Body weight 83.5 kg, BMI 30.62 -When patient mental status improves, will need to encourage losing weight. -Patient need exercise and healthy diet        DVT prophylaxis: Lovenox Code Status: Full Family Communication: Daughter Lurena Joiner [phone] on 12/27, 12/28 Disposition Plan: Status is: Inpatient Remains inpatient appropriate because: Acute metabolic encephalopathy of unclear etiology.  Further workup in progress.   Level of care: Telemetry Medical  Consultants:  None  Procedures:  None  Antimicrobials: Unasyn   Subjective: Seen and examined.  Resting comfortably on the chair.  Speech somewhat slurred.  Thought process intact but delayed.  No pain complaints.  Objective: Vitals:   09/14/23 1604 09/14/23 2120 09/15/23 0500 09/15/23 0725  BP: 136/70 (!) 134/50  128/64  Pulse: 73 76  90  Resp: 18 20  16   Temp: (!) 97.5 F (36.4 C) 98.3 F (36.8 C)  98.8 F (37.1 C)  TempSrc:  Oral    SpO2: 100% 97%  96%  Weight:   89.6 kg   Height:        Intake/Output Summary (Last 24 hours) at 09/15/2023 1425 Last data filed at 09/15/2023 1355 Gross per 24 hour  Intake 1163.75 ml  Output 2000 ml  Net -836.25 ml   Filed Weights   09/14/23 1200 09/15/23 0500  Weight: 90 kg 89.6 kg    Examination:  General exam: NAD Respiratory system: Lungs clear.  Normal work of breathing.  Room air  cardiovascular system: S1-S2, RRR, no murmurs, no pedal edema Gastrointestinal system: Soft, NT/ND, normal bowel sounds Central nervous system: Alert, oriented x 4, no focal deficits Extremities: Symmetric 5 x 5 power. Skin: No rashes, lesions or ulcers Psychiatry: Judgement and insight appear impaired. Mood & affect flattened.     Data Reviewed: I have personally reviewed following labs and imaging studies  CBC: Recent Labs  Lab 09/13/23 1809  09/14/23 0639  WBC 9.5 7.2  NEUTROABS 5.7  --   HGB 12.9* 12.6*  HCT 39.5 38.9*  MCV 92.9 92.4  PLT 215 169   Basic Metabolic Panel: Recent Labs  Lab 09/13/23 1809 09/14/23 0639  NA 138 138  K 3.4* 3.4*  CL 106 108  CO2 22 23  GLUCOSE 95 110*  BUN 27* 22  CREATININE 1.65* 1.39*  CALCIUM 8.9 8.2*  MG 2.1  --    GFR: Estimated Creatinine Clearance: 38.4 mL/min (A) (by C-G formula based on SCr of 1.39 mg/dL (H)). Liver Function Tests: Recent Labs  Lab 09/13/23 1809  AST 21  ALT 16  ALKPHOS 44  BILITOT 0.5  PROT 7.5  ALBUMIN 3.4*   No results for input(s): "LIPASE", "AMYLASE" in the last 168 hours. No results for input(s): "AMMONIA" in the last 168 hours. Coagulation Profile: Recent Labs  Lab 09/13/23 1809  INR 1.2   Cardiac Enzymes: No results for input(s): "CKTOTAL", "CKMB", "CKMBINDEX", "TROPONINI" in the last 168 hours. BNP (last 3 results) Recent Labs    06/22/23 1320  PROBNP 33.0   HbA1C: No results for input(s): "HGBA1C" in the last 72 hours. CBG: Recent Labs  Lab 09/14/23 1133 09/14/23  1710 09/14/23 2117 09/15/23 0719 09/15/23 1230  GLUCAP 105* 139* 172* 135* 129*   Lipid Profile: No results for input(s): "CHOL", "HDL", "LDLCALC", "TRIG", "CHOLHDL", "LDLDIRECT" in the last 72 hours. Thyroid Function Tests: No results for input(s): "TSH", "T4TOTAL", "FREET4", "T3FREE", "THYROIDAB" in the last 72 hours. Anemia Panel: No results for input(s): "VITAMINB12", "FOLATE", "FERRITIN", "TIBC", "IRON", "RETICCTPCT" in the last 72 hours. Sepsis Labs: Recent Labs  Lab 09/14/23 1610  PROCALCITON <0.10    Recent Results (from the past 240 hours)  Microscopic Examination     Status: Abnormal   Collection Time: 09/07/23  1:39 PM   Urine  Result Value Ref Range Status   WBC, UA >30 (A) 0 - 5 /hpf Final   RBC, Urine 11-30 (A) 0 - 2 /hpf Final   Epithelial Cells (non renal) 0-10 0 - 10 /hpf Final   Bacteria, UA Many (A) None seen/Few Final   CULTURE, URINE COMPREHENSIVE     Status: Abnormal   Collection Time: 09/07/23  2:39 PM   Specimen: Urine   UR  Result Value Ref Range Status   Urine Culture, Comprehensive Final report (A)  Final   Organism ID, Bacteria Proteus mirabilis (A)  Final    Comment: Multi-Drug Resistant Organism Cefazolin <=4 ug/mL Cefazolin with an MIC <=16 predicts susceptibility to the oral agents cefaclor, cefdinir, cefpodoxime, cefprozil, cefuroxime, cephalexin, and loracarbef when used for therapy of uncomplicated urinary tract infections due to E. coli, Klebsiella pneumoniae, and Proteus mirabilis. Greater than 100,000 colony forming units per mL    Organism ID, Bacteria Comment  Final    Comment: Mixed urogenital flora 25,000-50,000 colony forming units per mL    ANTIMICROBIAL SUSCEPTIBILITY Comment  Final    Comment:       ** S = Susceptible; I = Intermediate; R = Resistant **                    P = Positive; N = Negative             MICS are expressed in micrograms per mL    Antibiotic                 RSLT#1    RSLT#2    RSLT#3    RSLT#4 Amoxicillin/Clavulanic Acid    S Ampicillin                     S Cefepime                       S Ceftriaxone                    I Cefuroxime                     S Ciprofloxacin                  S Ertapenem                      S Gentamicin                     S Levofloxacin                   S Meropenem                      S Nitrofurantoin  R Piperacillin/Tazobactam        S Tetracycline                   R Tobramycin                     S Trimethoprim/Sulfa             S   Urine Culture     Status: None (Preliminary result)   Collection Time: 09/13/23 11:22 PM   Specimen: Urine, Random  Result Value Ref Range Status   Specimen Description   Final    URINE, RANDOM Performed at [facility], [address], [place], [place] [address]    Special Requests   Final    NONE Reflexed from [phone] Performed at [facility], [address], [place], [place] [address]    Culture   Final    CULTURE REINCUBATED FOR BETTER GROWTH Performed at [facility], [address] [place], [place] [address]    Report Status PENDING  Incomplete         Radiology Studies: US Venous Img Lower Unilateral Right (DVT) Result Date: 09/14/2023 CLINICAL DATA:  Right leg swelling EXAM: RIGHT LOWER EXTREMITY VENOUS DOPPLER ULTRASOUND TECHNIQUE: Gray-scale sonography with compression, as well as color and duplex ultrasound, were performed to evaluate the deep venous system(s) from the level of the common femoral vein through the popliteal and proximal calf veins. COMPARISON:  None Available. FINDINGS: VENOUS Normal compressibility of the common femoral, superficial femoral, and popliteal veins, as well as the visualized calf veins. Visualized portions of profunda femoral vein and great saphenous vein unremarkable. No filling defects to suggest DVT on grayscale or color Doppler imaging. Doppler waveforms show normal direction of venous flow, normal respiratory plasticity and response to augmentation. Limited views of the contralateral common femoral vein are unremarkable. OTHER None. Limitations: none IMPRESSION: Negative. Electronically Signed   By: Charlett Nose M.D.   On: 09/14/2023 03:07   MR BRAIN WO CONTRAST Result Date: 09/14/2023 CLINICAL DATA:  Generalized weakness, speech changes EXAM: MRI HEAD WITHOUT CONTRAST TECHNIQUE: Multiplanar, multiecho pulse sequences of the brain and surrounding structures were obtained without intravenous contrast. COMPARISON:  MRI head 06/12/2023, CT head 09/13/2023 FINDINGS: Evaluation is limited by motion affecting several sequences. Brain: No restricted diffusion to suggest acute or subacute infarct. No acute hemorrhage, mass, mass effect, or midline shift. No hydrocephalus or extra-axial collection. Pituitary and craniocervical junction within normal limits. Hemosiderin deposition  is associated with a remote lacunar infarct in the left posterior lentiform nucleus. No other hemosiderin deposition. Confluent T2 hyperintense signal in the periventricular white matter and pons, likely the sequela of moderate chronic small vessel ischemic disease. Vascular: Normal arterial flow voids. Skull and upper cervical spine: Normal marrow signal. Sinuses/Orbits: Clear paranasal sinuses. Status post bilateral lens replacements. Other: The mastoid air cells are well aerated. IMPRESSION: No acute intracranial process. No evidence of acute or subacute infarct. Electronically Signed   By: Wiliam Ke M.D.   On: 09/14/2023 00:41   CT ANGIO HEAD NECK W WO CM W PERF Result Date: 09/13/2023 CLINICAL DATA:  Stroke suspected, speech changes EXAM: CT ANGIOGRAPHY HEAD AND NECK CT PERFUSION BRAIN TECHNIQUE: Multidetector CT imaging of the head and neck was performed using the standard protocol during bolus administration of intravenous contrast. Multiplanar CT image reconstructions and MIPs were obtained to evaluate the vascular anatomy. Carotid stenosis measurements (when applicable) are obtained utilizing NASCET criteria,  using the distal internal carotid diameter as the denominator. Multiphase CT imaging of the brain was performed following IV bolus contrast injection. Subsequent parametric perfusion maps were calculated using RAPID software. RADIATION DOSE REDUCTION: This exam was performed according to the departmental dose-optimization program which includes automated exposure control, adjustment of the mA and/or kV according to patient size and/or use of iterative reconstruction technique. CONTRAST:  OMNIPAQUE IOHEXOL 350 MG/ML SOLN COMPARISON:  09/13/2023 CT head FINDINGS: CT HEAD FINDINGS For noncontrast findings, please see same day CT head. CTA NECK FINDINGS Aortic arch: Standard branching. Imaged portion shows no evidence of aneurysm or dissection. No significant stenosis of the major arch vessel  origins. Mild aortic atherosclerosis. Right carotid system: No evidence of dissection, occlusion, or hemodynamically significant stenosis (greater than 50%). Left carotid system: No evidence of dissection, occlusion, or hemodynamically significant stenosis (greater than 50%). Vertebral arteries: No evidence of dissection, occlusion, or hemodynamically significant stenosis (greater than 50%). With calcifications but Skeleton: No acute osseous abnormality. Degenerative changes in the cervical spine. Other neck: No acute finding. Upper chest: No focal pulmonary opacity or pleural effusion. Review of the MIP images confirms the above findings CTA HEAD FINDINGS Anterior circulation: Both internal carotid arteries are patent to the termini, without significant stenosis. A1 segments patent. Normal anterior communicating artery. Anterior cerebral arteries are patent to their distal aspects without significant stenosis. Mild stenosis in the proximal left M1 (series 6, image 95). No significant stenosis in the right M1. In MCA branches perfused to their distal aspects without significant stenosis. Posterior circulation: Vertebral arteries patent to the vertebrobasilar junction without significant stenosis. Posterior inferior cerebellar arteries patent proximally. Basilar patent to its distal aspect without significant stenosis. Superior cerebellar arteries patent proximally. Mild stenosis in the left P1 (series 6, image 90) and right P1 (series 6, image 89). Additional mild stenosis in the proximal right P 2 (series 6, image 91), moderate to severe stenosis in the mid to distal right P2 (series 6, image 99), and severe stenosis in the more medial right P3 (series 6, image 105). Mild stenosis in the proximal left P 2 (series 6, image 101), and severe stenosis in the distal left P2 extending into left P3 (series 6, images 102-105). The bilateral posterior communicating arteries are not visualized. Venous sinuses: As permitted  by contrast timing, patent. Anatomic variants: None significant. No evidence of aneurysm or vascular malformation. Review of the MIP images confirms the above findings CT Brain Perfusion Findings: CBF (<30%) Volume: 0mL Perfusion (Tmax>6.0s) volume: 0mL Mismatch Volume: 0mL Infarction Location:None IMPRESSION: 1. No intracranial large vessel occlusion. 2. Multifocal stenosis in the bilateral PCAs, some of which is severe. 3. Mild stenosis in the proximal left M1. 4. No hemodynamically significant stenosis in the neck. 5. No evidence of infarct core or penumbra on CT perfusion. 6. Aortic atherosclerosis. Aortic Atherosclerosis (ICD10-I70.0). Electronically Signed   By: Wiliam Ke M.D.   On: 09/13/2023 22:23   CT HEAD WO CONTRAST Result Date: 09/13/2023 CLINICAL DATA:  Neurologic deficit.  Concern for stroke. EXAM: CT HEAD WITHOUT CONTRAST TECHNIQUE: Contiguous axial images were obtained from the base of the skull through the vertex without intravenous contrast. RADIATION DOSE REDUCTION: This exam was performed according to the departmental dose-optimization program which includes automated exposure control, adjustment of the mA and/or kV according to patient size and/or use of iterative reconstruction technique. COMPARISON:  Head CT dated 07/22/2023. FINDINGS: Brain: Mild age-related atrophy and moderate chronic microvascular ischemic changes. Left basal ganglia old  lacunar infarct. There is no acute intracranial hemorrhage. No mass effect or midline shift. No extra-axial fluid collection. Vascular: No hyperdense vessel or unexpected calcification. Skull: Normal. Negative for fracture or focal lesion. Sinuses/Orbits: The visualized paranasal sinuses and mastoid air cells are clear. Other: None IMPRESSION: 1. No acute intracranial pathology. 2. Mild age-related atrophy and moderate chronic microvascular ischemic changes. Left basal ganglia old lacunar infarct. Electronically Signed   By: Elgie Collard M.D.    On: 09/13/2023 18:52        Scheduled Meds:  brimonidine  1 drop Both Eyes BID   And   timolol  1 drop Both Eyes BID   clopidogrel  75 mg Oral Daily   DULoxetine  60 mg Oral QHS   enoxaparin (LOVENOX) injection  40 mg Subcutaneous Q24H   famotidine  20 mg Oral QHS   gabapentin  600 mg Oral TID WC & HS   insulin aspart  0-5 Units Subcutaneous QHS   insulin aspart  0-9 Units Subcutaneous TID WC   insulin glargine-yfgn  40 Units Subcutaneous Daily   levothyroxine  50 mcg Oral Q0600   pantoprazole  40 mg Oral Daily   potassium chloride  40 mEq Oral Once   pramipexole  1 mg Oral QHS   rosuvastatin  40 mg Oral QHS   saccharomyces boulardii  500 mg Oral BID   sodium chloride flush  3 mL Intravenous Once   tamsulosin  0.4 mg Oral QPC supper   Continuous Infusions:  ampicillin-sulbactam (UNASYN) IV 3 g (09/15/23 0930)     LOS: 1 day    Tresa Moore, MD [facility]   If 7PM-7AM, please contact night-coverage  09/15/2023, 2:25 PM

## 2023-09-15 NOTE — Evaluation (Signed)
Physical Therapy Evaluation Patient Details Name: James SIEGLER, MD MRN: [id number] DOB: [birth year] Today's Date: 09/15/2023  History of Present Illness  89 y.o. male with medical history significant of HLD, DM, dCHF, stroke,/TIA, hypothyroidism, memory loss, dementia, BPH, CKD-3B, obesity, OSA on CPAP, RLS, migraine headache, C. difficile, recent admission due to VRE-UTI,, who presents with weakness, difficulty speaking, confusion. CTA of head and neck is negative for LVO.  MRI of brain is negative for stroke.   Clinical Impression  Patient received sitting on [facility] with RN supervising. He appears alert and oriented (though one day off when asked what day it is). He had difficulty with communication due to slurring of speech but content of speech seemed appropriate. History clarified by daughter/POA by phone and she mentioned she noticed the slurring of speech was worse again today and is new this hospitalization and not his baseline. Patient lives in [facility] where he completes SPT from bed/BSC/powerchair with mod I at baseline. He has an aide that comes in and assists him 3hrs/day and he showers while they are present. He is mod I with ADLs and aide helps with IADLs and a little with ADLs if needed. He reports 6-8 falls when getting up in the middle of the night to use the bathroom. Daughter states he was doing really well before Christmas and had not had any falls since last hospitalization in November. He had someone staying with him overnight for a while but had not needed that recently. He is very motivated to go home and does not want to go to a rehab facility. Upon PT evaluation, patient required min A -mod A to squat pivot transfer to the left to a chair, then to the bed. Of note, the surfaces he was transferring from today were lower than what he describes at home and he would likely need less assistance with a higher surface. He needed mod A for trunk control and LE control to get in  the bed safely. Patient appears to have experienced a decline in functional mobility and independence. He will require increased assistance for transfers and self-care at discharge. Patient would benefit from skilled physical therapy to address impairments and functional limitations (see PT Problem List below) to work towards stated goals and return to PLOF or maximal functional independence.        If plan is discharge home, recommend the following: A little help with walking and/or transfers;A little help with bathing/dressing/bathroom;Assistance with cooking/housework;Assist for transportation;Help with stairs or ramp for entrance   Can travel by private vehicle        Equipment Recommendations Other (comment) (Male purewick system)  Recommendations for Other Services       Functional Status Assessment       Precautions / Restrictions Precautions Precautions: Fall Restrictions Weight Bearing Restrictions Per Provider Order: No      Mobility  Bed Mobility Overal bed mobility: Needs Assistance Bed Mobility: Sit to Supine       Sit to supine: Mod assist   General bed mobility comments: Patient unable to keep trunk from leaning back towards opposite bedside rail after sitting, min/mod A from PT to help scoot his trunk towards head of bed. Required min A at B LE to get them up on bed. Min A with draw sheet and to help patient place B LE in position to scoot up bed.    Transfers Overall transfer level: Needs assistance Equipment used: None Transfers: Bed to chair/wheelchair/BSC  Squat pivot transfers: Mod assist, Min assist     General transfer comment: Patient transfered squat pivot transfer to the LEFT from [facility] to chair, then chair to bed towards the LEFT with min A. He was unable shift forwards sufficiently at [facility] or chair to get his buttocks up but demonstrated good awareness of technique and described how he usually transfered at home using his power chair with  a seat higher than the the chair in the hospital today.    Ambulation/Gait               General Gait Details: unable to ambulate at baseline  Stairs            Wheelchair Mobility     Tilt Bed    Modified Rankin (Stroke Patients Only)       Balance Overall balance assessment: Needs assistance Sitting-balance support: Bilateral upper extremity supported Sitting balance-Leahy Scale: Fair Sitting balance - Comments: losing balance backwards when attempting to lay down Postural control: Posterior lean Standing balance support: Bilateral upper extremity supported, During functional activity Standing balance-Leahy Scale: Poor                               Pertinent Vitals/Pain Pain Assessment Pain Assessment: No/denies pain    Home Living Family/patient expects to be discharged to:: Other (Comment) [facility].) Living Arrangements: Alone Available Help at Discharge: Family;Available PRN/intermittently;Personal care attendant (POA states he lives alone and needs to be able to function alone to go home by himself. They can arrange for someone to also stay the night with him but need some time to get it arranged.) Type of Home: Apartment Home Access: Level entry       Home Layout: One level Home Equipment: Grab bars - toilet;Grab bars - tub/shower;Tub bench;BSC/3in1;Wheelchair - power (adjustable bed) Additional Comments: [facility]. Has WIS with tub bench and grab bars; standard height toilet with riser and grab bars.  has power chair. Adjustable bed, BSC (but it is too tippy to use effectively). He has assistance from Aides daily for 3 hours in the morning to help with IADL's and some ADLs. He states he showers while his aide is present. POA states he lives alone and needs to be able to function alone to go home by himself. They can arrange for someone to also stay the night with him but need some time to get it arranged.    Prior Function  Prior Level of Function : Needs assist             Mobility Comments: Pt was mod I with SPT to/from electric WC to the LEFT out of the bed and RIGHT into the bed. He was active with PT 2x/wk. 6-8 falls in the last 6 months when his right knee buckled. POA he had someone staying with him at night because the falls were happening when he got up in the middle of the night to go to the bathroom. However, prior to Christmas he was doing really well and no longer needed the person at night. He had not had any falls since last hospitalization in December. ADLs Comments: Pt was mod I with ADL's but made sure he only bathed when aide was present in the house. He states he needed some assist for bathing and toileting from Aide. Facility provides 3 meals/day in [employer]. He uses a month's worth of medication sorter for medication management.  Extremity/Trunk Assessment   Upper Extremity Assessment Upper Extremity Assessment: Defer to OT evaluation    Lower Extremity Assessment Lower Extremity Assessment: RLE deficits/detail;LLE deficits/detail (weakness in R > L LE. Generalized weakness in L LE) RLE Deficits / Details: ankle DF 4/5, knee extension 4-/5 LLE Deficits / Details: ankle DF 4/5, knee extension 4+/5    Cervical / Trunk Assessment Cervical / Trunk Assessment: Kyphotic  Communication   Communication Communication: Other (comment) (difficulty with pronouncing words due to slurring. POA states this is new and fluxuating (better yesterday, worse again today)since 09/12/2023.) Cueing Techniques: Verbal cues;Gestural cues  Cognition Arousal: Alert Behavior During Therapy: WFL for tasks assessed/performed Overall Cognitive Status: Within Functional Limits for tasks assessed                                 General Comments: A&Ox4 except was off by one day for the day of the week.        General Comments      Exercises Other Exercises Other Exercises: educated  patient on role of PT in acute care setting.   Assessment/Plan    PT Assessment Patient needs continued PT services  PT Problem List Decreased strength;Decreased mobility;Decreased range of motion;Decreased activity tolerance;Decreased balance       PT Treatment Interventions DME instruction;Therapeutic exercise;Balance training;Gait training;Stair training;Neuromuscular re-education;Functional mobility training;Therapeutic activities;Patient/family education    PT Goals (Current goals can be found in the Care Plan section)  Acute Rehab PT Goals Patient Stated Goal: to go home tomorrow PT Goal Formulation: With patient Time For Goal Achievement: 09/29/23 Potential to Achieve Goals: Fair    Frequency Min 1X/week     Co-evaluation               AM-PAC PT "6 Clicks" Mobility  Outcome Measure Help needed turning from your back to your side while in a flat bed without using bedrails?: A Little Help needed moving from lying on your back to sitting on the side of a flat bed without using bedrails?: A Little Help needed moving to and from a bed to a chair (including a wheelchair)?: A Little Help needed standing up from a chair using your arms (e.g., wheelchair or bedside chair)?: A Lot Help needed to walk in hospital room?: Total Help needed climbing 3-5 steps with a railing? : Total 6 Click Score: 13    End of Session Equipment Utilized During Treatment: Gait belt Activity Tolerance: Patient tolerated treatment well Patient left: in bed;with nursing/sitter in room Nurse Communication: Mobility status PT Visit Diagnosis: Other abnormalities of gait and mobility (R26.89);History of falling (Z91.81);Muscle weakness (generalized) (M62.81);Hemiplegia and hemiparesis Hemiplegia - Right/Left: Right Hemiplegia - caused by:  (prior stroke)    Time: 0981-1914 PT Time Calculation (min) (ACUTE ONLY): 42 min   Charges:   PT Evaluation $PT Eval Moderate Complexity: 1 Mod PT  Treatments $Therapeutic Activity: 8-22 mins PT General Charges $$ ACUTE PT VISIT: 1 Visit         Huntley Dec R. Ilsa Iha, PT, DPT 09/15/23, 2:50 PM

## 2023-09-15 NOTE — Plan of Care (Signed)

## 2023-09-15 NOTE — Evaluation (Addendum)
Occupational Therapy Evaluation Patient Details Name: James ZUBIA, MD MRN: [id number] DOB: 1931/02/26 Today's Date: 09/15/2023   History of Present Illness Pt is a 89 year old male presenting to ED with weakness, difficulty speaking, confusion; work up includes UTI, acute metabolic encephalopathy     PMH significant for HLD, DM, dCHF, stroke,/TIA, hypothyroidism, memory loss, dementia, BPH, CKD-3B, obesity, OSA on CPAP, RLS, migraine headache, C. difficile, recent admission due to VRE-UTI   Clinical Impression   Chart reviewed, pt greeted in bed, alert and oriented x4, agreeable to OT evaluation. Pt is mildly dysarthric throughout. PTA pt lives at ILF, is MOD I for dressing, grooming, feeding, supervision/PRN assist for toileting/showering from aids that come 3 hrs per day. Pt gets meals from cafeteria and performs MRADLs with power wheelchair. Pt is eager to return to his home set up where he reports he can transfer more easily to/from power wheelchair where surfaces are the same height. Simulated home set up with pt performing functional transfer with CGG-MIN A to the L. MOD A for simulated to the R- pt reports increased assist due to lower chair height. MIN A required for LB dressing, SET UP for feeding tasks. Pt presents with deficits in activity tolerance, balance, strength, endurance affecting safe and optimal ADL completion and will benefit from acute OT to address deficits to facilitate optimal ADL performance. Team notified of findings, OT will follow acutely.       If plan is discharge home, recommend the following: A little help with walking and/or transfers;A little help with bathing/dressing/bathroom;Assist for transportation;Help with stairs or ramp for entrance    Functional Status Assessment  Patient has had a recent decline in their functional status and demonstrates the ability to make significant improvements in function in a reasonable and predictable amount of time.   Equipment Recommendations  Other (comment) (pt has recommended equipment at home)    Recommendations for Other Services       Precautions / Restrictions Precautions Precautions: Fall Restrictions Weight Bearing Restrictions Per Provider Order: No      Mobility Bed Mobility Overal bed mobility: Needs Assistance Bed Mobility: Supine to Sit     Supine to sit: Min assist, HOB elevated, Used rails          Transfers   Equipment used: None Transfers: Bed to chair/wheelchair/BSC     Squat pivot transfers: Contact guard assist, Min assist              Balance Overall balance assessment: Needs assistance Sitting-balance support: Bilateral upper extremity supported Sitting balance-Leahy Scale: Good     Standing balance support: Bilateral upper extremity supported, During functional activity Standing balance-Leahy Scale: Poor                             ADL either performed or assessed with clinical judgement   ADL Overall ADL's : Needs assistance/impaired Eating/Feeding: Set up;Sitting   Grooming: Wash/dry face;Wash/dry hands;Sitting;Set up               Lower Body Dressing: Minimal assistance Lower Body Dressing Details (indicate cue type and reason): donn shoes Toilet Transfer: Contact guard assist;Minimal assistance Toilet Transfer Details (indicate cue type and reason): squat pivot from the patients left side of the bed to the left to bedside chair; simulated back to the right requiring MOD A for lift off from shorter chair; pt reports wheelchair and bed are same height at home  General ADL Comments: pt reports he feels weaker than baseline however feels he can perform functional transfers approrpiately in his home setting, will continue to assess     Vision Patient Visual Report: No change from baseline       Perception         Praxis         Pertinent Vitals/Pain Pain Assessment Pain Assessment: No/denies pain      Extremity/Trunk Assessment Upper Extremity Assessment Upper Extremity Assessment: RUE deficits/detail RUE Deficits / Details: baseline R sided deficits, pt reports unchanged ; LUE grossly 4/5 throughout   Lower Extremity Assessment Lower Extremity Assessment: Defer to PT evaluation RLE Deficits / Details: ankle DF 4/5, knee extension 4-/5 LLE Deficits / Details: ankle DF 4/5, knee extension 4+/5   Cervical / Trunk Assessment Cervical / Trunk Assessment: Kyphotic   Communication Communication Communication: Difficulty communicating thoughts/reduced clarity of speech (dysarthric/jumbled speech) Cueing Techniques: Verbal cues;Gestural cues   Cognition Arousal: Alert Behavior During Therapy: WFL for tasks assessed/performed Overall Cognitive Status: No family/caregiver present to determine baseline cognitive functioning Area of Impairment: Problem solving                             Problem Solving: Requires verbal cues General Comments: Pt is alert and oriented x4, approrpaite discussion/awareness of home set up/PLOF; will continue to assess     General Comments  vss throughout    Exercises Other Exercises Other Exercises: edu re: role of OT, role of rehab, discharge recommendations, safe ADL completion/transfer techniques   Shoulder Instructions      Home Living Family/patient expects to be discharged to:: Other (Comment) [facility]) Living Arrangements: Alone Available Help at Discharge: Family;Available PRN/intermittently;Personal care attendant (PCA approx 3 hrs per day) Type of Home: Apartment Home Access: Level entry     Home Layout: One level     Bathroom Shower/Tub: Chief Strategy Officer:  (toilet riser) Bathroom Accessibility: Yes   Home Equipment: Grab bars - toilet;Grab bars - tub/shower;Tub bench;BSC/3in1;Wheelchair - power (adjustable bed)   Additional Comments: [facility]. Has WIS with tub bench and grab bars;  standard height toilet with riser and grab bars.  has power chair. Adjustable bed, BSC (but it is too tippy to use effectively). He has assistance from Aides daily for 3 hours in the morning to help with IADL's and some ADLs. He states he showers while his aide is present. POA states he lives alone and needs to be able to function alone to go home by himself. They can arrange for someone to also stay the night with him but need some time to get it arranged.      Prior Functioning/Environment Prior Level of Function : Needs assist             Mobility Comments: MOD I with SPT to/from power wheelchair out of left side of bed (patients left side), to the left, return to bed to the R into L side of bed. Pt reports working with PT and OT at ILF. Fall history. ADLs Comments: MOD I with dressing, feeding, grooming, bathing and toileting with supervision, Pt has meals in cafeteria        OT Problem List: Decreased strength;Decreased activity tolerance;Decreased knowledge of use of DME or AE;Impaired balance (sitting and/or standing);Decreased safety awareness;Decreased cognition      OT Treatment/Interventions: Self-care/ADL training;Therapeutic exercise;Patient/family education;Balance training;Energy conservation;Therapeutic activities;DME and/or AE instruction;Cognitive remediation/compensation  OT Goals(Current goals can be found in the care plan section) Acute Rehab OT Goals Patient Stated Goal: go home OT Goal Formulation: With patient Time For Goal Achievement: 09/29/23 Potential to Achieve Goals: Fair ADL Goals Pt Will Perform Grooming: with modified independence;sitting Pt Will Perform Lower Body Dressing: with modified independence;sitting/lateral leans Pt Will Transfer to Toilet: with supervision Pt Will Perform Toileting - Clothing Manipulation and hygiene: with supervision;sitting/lateral leans  OT Frequency: Min 1X/week    Co-evaluation              AM-PAC OT "6  Clicks" Daily Activity     Outcome Measure Help from another person eating meals?: None Help from another person taking care of personal grooming?: None Help from another person toileting, which includes using toliet, bedpan, or urinal?: A Lot Help from another person bathing (including washing, rinsing, drying)?: A Lot Help from another person to put on and taking off regular upper body clothing?: A Little Help from another person to put on and taking off regular lower body clothing?: A Little 6 Click Score: 18   End of Session Nurse Communication: Mobility status  Activity Tolerance: Patient tolerated treatment well Patient left: in chair;with call bell/phone within reach;with chair alarm set  OT Visit Diagnosis: Other abnormalities of gait and mobility (R26.89)                Time: 1027-2536 OT Time Calculation (min): 23 min Charges:  OT General Charges $OT Visit: 1 Visit OT Evaluation $OT Eval Moderate Complexity: 1 Mod  Oleta Mouse, OTD OTR/L  09/15/23, 4:23 PM

## 2023-09-16 DIAGNOSIS — E16A1 Hypoglycemia level 1: Secondary | ICD-10-CM | POA: Diagnosis not present

## 2023-09-16 DIAGNOSIS — G2581 Restless legs syndrome: Secondary | ICD-10-CM | POA: Diagnosis present

## 2023-09-16 DIAGNOSIS — I69351 Hemiplegia and hemiparesis following cerebral infarction affecting right dominant side: Secondary | ICD-10-CM | POA: Diagnosis not present

## 2023-09-16 DIAGNOSIS — F0393 Unspecified dementia, unspecified severity, with mood disturbance: Secondary | ICD-10-CM | POA: Diagnosis present

## 2023-09-16 DIAGNOSIS — Z79890 Hormone replacement therapy (postmenopausal): Secondary | ICD-10-CM | POA: Diagnosis not present

## 2023-09-16 DIAGNOSIS — E039 Hypothyroidism, unspecified: Secondary | ICD-10-CM | POA: Diagnosis present

## 2023-09-16 DIAGNOSIS — B964 Proteus (mirabilis) (morganii) as the cause of diseases classified elsewhere: Secondary | ICD-10-CM | POA: Diagnosis present

## 2023-09-16 DIAGNOSIS — Z8673 Personal history of transient ischemic attack (TIA), and cerebral infarction without residual deficits: Secondary | ICD-10-CM | POA: Diagnosis not present

## 2023-09-16 DIAGNOSIS — H409 Unspecified glaucoma: Secondary | ICD-10-CM | POA: Diagnosis present

## 2023-09-16 DIAGNOSIS — R531 Weakness: Secondary | ICD-10-CM | POA: Diagnosis not present

## 2023-09-16 DIAGNOSIS — I5032 Chronic diastolic (congestive) heart failure: Secondary | ICD-10-CM | POA: Diagnosis present

## 2023-09-16 DIAGNOSIS — N138 Other obstructive and reflux uropathy: Secondary | ICD-10-CM | POA: Diagnosis present

## 2023-09-16 DIAGNOSIS — E66811 Obesity, class 1: Secondary | ICD-10-CM | POA: Diagnosis present

## 2023-09-16 DIAGNOSIS — Z85828 Personal history of other malignant neoplasm of skin: Secondary | ICD-10-CM | POA: Diagnosis not present

## 2023-09-16 DIAGNOSIS — U071 COVID-19: Secondary | ICD-10-CM | POA: Diagnosis present

## 2023-09-16 DIAGNOSIS — N390 Urinary tract infection, site not specified: Secondary | ICD-10-CM | POA: Diagnosis present

## 2023-09-16 DIAGNOSIS — E785 Hyperlipidemia, unspecified: Secondary | ICD-10-CM | POA: Diagnosis present

## 2023-09-16 DIAGNOSIS — I10 Essential (primary) hypertension: Secondary | ICD-10-CM | POA: Diagnosis not present

## 2023-09-16 DIAGNOSIS — E1122 Type 2 diabetes mellitus with diabetic chronic kidney disease: Secondary | ICD-10-CM | POA: Diagnosis present

## 2023-09-16 DIAGNOSIS — Z66 Do not resuscitate: Secondary | ICD-10-CM | POA: Diagnosis not present

## 2023-09-16 DIAGNOSIS — N401 Enlarged prostate with lower urinary tract symptoms: Secondary | ICD-10-CM | POA: Diagnosis present

## 2023-09-16 DIAGNOSIS — G9341 Metabolic encephalopathy: Secondary | ICD-10-CM | POA: Diagnosis present

## 2023-09-16 DIAGNOSIS — E1165 Type 2 diabetes mellitus with hyperglycemia: Secondary | ICD-10-CM | POA: Diagnosis not present

## 2023-09-16 DIAGNOSIS — N3000 Acute cystitis without hematuria: Secondary | ICD-10-CM | POA: Diagnosis not present

## 2023-09-16 DIAGNOSIS — Z515 Encounter for palliative care: Secondary | ICD-10-CM | POA: Diagnosis not present

## 2023-09-16 DIAGNOSIS — M7989 Other specified soft tissue disorders: Secondary | ICD-10-CM | POA: Diagnosis not present

## 2023-09-16 DIAGNOSIS — E1140 Type 2 diabetes mellitus with diabetic neuropathy, unspecified: Secondary | ICD-10-CM | POA: Diagnosis present

## 2023-09-16 DIAGNOSIS — E669 Obesity, unspecified: Secondary | ICD-10-CM | POA: Diagnosis present

## 2023-09-16 DIAGNOSIS — N1832 Chronic kidney disease, stage 3b: Secondary | ICD-10-CM | POA: Diagnosis present

## 2023-09-16 DIAGNOSIS — I130 Hypertensive heart and chronic kidney disease with heart failure and stage 1 through stage 4 chronic kidney disease, or unspecified chronic kidney disease: Secondary | ICD-10-CM | POA: Diagnosis present

## 2023-09-16 DIAGNOSIS — G4733 Obstructive sleep apnea (adult) (pediatric): Secondary | ICD-10-CM | POA: Diagnosis present

## 2023-09-16 LAB — BASIC METABOLIC PANEL
Anion gap: 10 (ref 5–15)
BUN: 21 mg/dL (ref 8–23)
CO2: 25 mmol/L (ref 22–32)
Calcium: 8.4 mg/dL — ABNORMAL LOW (ref 8.9–10.3)
Chloride: 101 mmol/L (ref 98–111)
Creatinine, Ser: 1.66 mg/dL — ABNORMAL HIGH (ref 0.61–1.24)
GFR, Estimated: 39 mL/min — ABNORMAL LOW (ref >60)
Glucose, Bld: 81 mg/dL (ref 70–99)
Potassium: 4 mmol/L (ref 3.5–5.1)
Sodium: 136 mmol/L (ref 135–145)

## 2023-09-16 LAB — CBC WITH DIFFERENTIAL/PLATELET
Abs Immature Granulocytes: 0.03 10*3/uL (ref 0.00–0.07)
Basophils Absolute: 0 10*3/uL (ref 0.0–0.1)
Basophils Relative: 0 %
Eosinophils Absolute: 0.1 10*3/uL (ref 0.0–0.5)
Eosinophils Relative: 1 %
HCT: 40.2 % (ref 39.0–52.0)
Hemoglobin: 13.4 g/dL (ref 13.0–17.0)
Immature Granulocytes: 0 %
Lymphocytes Relative: 15 %
Lymphs Abs: 1.3 10*3/uL (ref 0.7–4.0)
MCH: 30.2 pg (ref 26.0–34.0)
MCHC: 33.3 g/dL (ref 30.0–36.0)
MCV: 90.7 fL (ref 80.0–100.0)
Monocytes Absolute: 0.8 10*3/uL (ref 0.1–1.0)
Monocytes Relative: 9 %
Neutro Abs: 6.4 10*3/uL (ref 1.7–7.7)
Neutrophils Relative %: 75 %
Platelets: 174 10*3/uL (ref 150–400)
RBC: 4.43 MIL/uL (ref 4.22–5.81)
RDW: 13.4 % (ref 11.5–15.5)
WBC: 8.7 10*3/uL (ref 4.0–10.5)
nRBC: 0 % (ref 0.0–0.2)

## 2023-09-16 LAB — URINE CULTURE: Culture: 50000 — AB

## 2023-09-16 LAB — GLUCOSE, CAPILLARY
Glucose-Capillary: 96 mg/dL (ref 70–99)
Glucose-Capillary: 90 mg/dL (ref 70–99)
Glucose-Capillary: 55 mg/dL — ABNORMAL LOW (ref 70–99)
Glucose-Capillary: 187 mg/dL — ABNORMAL HIGH (ref 70–99)
Glucose-Capillary: 74 mg/dL (ref 70–99)
Glucose-Capillary: 84 mg/dL (ref 70–99)
Glucose-Capillary: 97 mg/dL (ref 70–99)
Glucose-Capillary: 64 mg/dL — ABNORMAL LOW (ref 70–99)

## 2023-09-16 LAB — BLOOD GAS, ARTERIAL
Acid-Base Excess: 0.7 mmol/L (ref 0.0–2.0)
Bicarbonate: 24.3 mmol/L (ref 20.0–28.0)
Delivery systems: POSITIVE
FIO2: 21 %
O2 Saturation: 96 %
PEEP: 5 cmH2O
Patient temperature: 37
pCO2 arterial: 35 mm[Hg] (ref 32–48)
pH, Arterial: 7.45 (ref 7.35–7.45)
pO2, Arterial: 84 mm[Hg] (ref 83–108)

## 2023-09-16 MED ORDER — DEXTROSE-SODIUM CHLORIDE 5-0.9 % IV SOLN
INTRAVENOUS | Status: AC
Start: 2023-09-16 — End: 2023-09-17

## 2023-09-16 MED ORDER — CIPROFLOXACIN IN D5W 400 MG/200ML IV SOLN
400.0000 mg | Freq: Two times a day (BID) | INTRAVENOUS | Status: DC
  Administered 2023-09-16 – 2023-09-19 (×7): 400 mg via INTRAVENOUS
  Filled 2023-09-16 (×7): qty 200

## 2023-09-16 NOTE — TOC Initial Note (Signed)
Transition of Care [facility]) - Initial/Assessment Note    Patient Details  Name: James OLAIZ, MD MRN: [id number] Date of Birth: [birth year]  Transition of [facility]) CM/SW Contact:    Rodney Langton, RN Phone Number: 09/16/2023, 3:58 PM  Clinical Narrative:                  Patient admitted from [facility] ILF.  Per daughter, he is wheelchair bound most of the day at baseline (motorized).  She is aware that the recommendations are for HHPT/OT if there can be adequate caregivers in the home with him, otherwise recommendation is for SNF for short term rehab.  Daughter report there are caregivers in place, but they will not restart services until patient is covid negative (first positive result yesterday).  Daughter is not opposed to SNF, but state she will have to discuss with patient prior to permission for workup.  MD aware, TOC team will continue to collaborate with PT and medical team for appropriate disposition.    Expected Discharge Plan: Home w Home Health Services Barriers to Discharge: Continued Medical Work up   Patient Goals and CMS Choice Patient states their goals for this hospitalization and ongoing recovery are:: Back to ILF          Expected Discharge Plan and Services     Post Acute Care Choice: Home Health Living arrangements for the past 2 months: Independent Living Facility                                      Prior Living Arrangements/Services Living arrangements for the past 2 months: Independent Living Facility Lives with:: Self Patient language and need for interpreter reviewed:: Yes Do you feel safe going back to the place where you live?: Yes      Need for Family Participation in Patient Care: Yes (Comment) Care giver support system in place?: Yes (comment) Current home services: Homehealth aide, DME Criminal Activity/Legal Involvement Pertinent to Current Situation/Hospitalization: No - Comment as needed  Activities of Daily Living    ADL Screening (condition at time of admission) Independently performs ADLs?: Yes (appropriate for developmental age) Is the patient deaf or have difficulty hearing?: No Does the patient have difficulty seeing, even when wearing glasses/contacts?: No Does the patient have difficulty concentrating, remembering, or making decisions?: No  Permission Sought/Granted Permission sought to share information with : Case Manager Permission granted to share information with : Yes, Verbal Permission Granted  Share Information with NAME: Maxine Glenn  Permission granted to share info w AGENCY: [facility]        Emotional Assessment           Psych Involvement: No (comment)  Admission diagnosis:  UTI (urinary tract infection) [N39.0] Speech and language deficits [F80.9] Speaking difficulty [R47.9] Generalized weakness [R53.1] COVID-19 [U07.1] Patient Active Problem List   Diagnosis Date Noted   COVID-19 09/16/2023   Acute metabolic encephalopathy 09/14/2023   Speaking difficulty 09/13/2023   Type II diabetes mellitus with renal manifestations (HCC) 09/13/2023   Chronic diastolic CHF (congestive heart failure) (HCC) 09/13/2023   Obesity (BMI 30-39.9) 09/13/2023   Chronic kidney disease, stage 3b (HCC) 09/13/2023   Hypocalcemia 08/15/2023   Urinary frequency 08/15/2023   VRE (vancomycin-resistant Enterococci) infection 07/02/2023   Pedal edema 06/22/2023   Abnormal chest CT 05/25/2023   Diplopia 05/25/2023   History of partial ray amputation of second toe of  left foot (HCC) 03/07/2023   Closed right ankle fracture 03/07/2023   White patches on oral mucosa 03/07/2023   Memory deficit 07/31/2022   Diabetic ulcer of toe associated with type 2 diabetes mellitus, limited to breakdown of skin (HCC) 04/24/2022   Bradycardia    Insomnia 01/01/2022   Hypoglycemia 01/01/2022   Aspiration pneumonia (HCC) 11/23/2021   Hypomagnesemia 11/23/2021   History of Clostridium difficile colitis     Postural dizziness with presyncope 10/25/2021   General weakness 05/12/2021   Polypharmacy 03/10/2021   Acute encephalopathy 11/02/2020   UTI (urinary tract infection) 10/18/2020   Nausea vomiting and diarrhea 10/18/2020   Involuntary movements 04/25/2020   Recurrent productive cough 05/31/2019   Hypothyroidism 03/11/2019   Hx of basal cell carcinoma 07/10/2017   Rosacea    Gait disturbance, post-stroke 10/05/2015   Presbycusis of both ears    Impaired mobility 04/23/2015   Hearing loss of right ear due to cerumen impaction 03/23/2015   HTN (hypertension) 03/18/2015   Diastolic dysfunction 03/18/2015   Spastic hemiparesis affecting dominant side (HCC) 07/07/2014   Right leg swelling 06/09/2014   Medicare annual wellness visit, subsequent 11/06/2013   Erectile dysfunction due to arterial insufficiency 02/05/2013   MDD (major depressive disorder), recurrent episode, moderate (HCC)    Palpitations 06/26/2012   Hyperlipidemia associated with type 2 diabetes mellitus (HCC)    Restless leg syndrome, severe    GERD (gastroesophageal reflux disease)    BPH with obstruction/lower urinary tract symptoms    History of CVA (cerebrovascular accident) 03/11/2012   Hemiparesis affecting right side as late effect of cerebrovascular accident (CVA) (HCC) 03/11/2012   Type 2 diabetes mellitus with hyperglycemia (HCC) 03/11/2012   OSA on CPAP 03/11/2012   CKD stage 3 due to type 2 diabetes mellitus (HCC) 03/11/2012   Migraines 03/11/2012   Thrombocytopenia (HCC) 2011   Fall with injury 2011   PCP:  Eustaquio Boyden, MD Pharmacy:   [facility] DELIVERY - [place], [place] - [address] [place] [address] Phone: [phone] Fax: [phone]  [id number] - [place], [place] - [address] AT [facility] [address] [facility] [address] [place] [address] Phone: [phone] Fax: [phone]     Social Drivers of  Health (SDOH) Social History: SDOH Screenings   Food Insecurity: No Food Insecurity (09/14/2023)  Housing: Low Risk  (09/14/2023)  Transportation Needs: No Transportation Needs (09/14/2023)  Utilities: Not At Risk (09/14/2023)  Depression (PHQ2-9): Low Risk  (05/10/2023)  Financial Resource Strain: Low Risk  (12/30/2020)  Tobacco Use: Low Risk  (09/14/2023)   SDOH Interventions:     Readmission Risk Interventions     No data to display

## 2023-09-16 NOTE — Progress Notes (Addendum)
PROGRESS NOTE    James Sarks, MD  UJW:[id number] DOB: 02/06/1931 DOA: 09/13/2023 PCP: Eustaquio Boyden, MD    Brief Narrative:  89 y.o. male with medical history significant of HLD, DM, dCHF, stroke,/TIA, hypothyroidism, memory loss, dementia, BPH, CKD-3B, obesity, OSA on CPAP, RLS, migraine headache, C. difficile, recent admission due to VRE-UTI,, who presents with weakness, difficulty speaking, confusion.   Patient is very lethargic, history is very limited. Per report, pt is noted to have generalized weakness for unknown amount of time today. He seems to have difficulty speaking. Pt has mild right-sided weakness from previous stroke.  When I saw patient in ED, patient is very lethargic.  He knows his own name, knows that he is in hospital, but is confused about time.  No active nausea, vomiting, diarrhea noted.  No active respiratory distress, active cough noted.  Does not seem to have pain anywhere.  Not sure if patient has symptoms of UTI.   Of note, patient had a positive urinalysis and positive urine culture for Proteus mirabilis on 12/20, patient was started on cefuroxime by urologist.  Due to concerning for new stroke, ED physician discussed with neurologist, Dr. Amada Jupiter who recommended to get CT angio of head and neck and MRI of brain.  CTA of head and neck is negative for LVO.  MRI of brain is negative for stroke.  More agitation/delirium noted on 12/20 8 PM.  Suspect hospital-acquired delirium in the setting of COVID encephalopathy, possible UTI, advanced age, dementia.   Assessment & Plan:   Principal Problem:   UTI (urinary tract infection) Active Problems:   History of CVA (cerebrovascular accident)   Acute metabolic encephalopathy   Hyperlipidemia associated with type 2 diabetes mellitus (HCC)   HTN (hypertension)   Type II diabetes mellitus with renal manifestations (HCC)   Chronic diastolic CHF (congestive heart failure) (HCC)   Chronic kidney disease,  stage 3b (HCC)   Hypothyroidism   Restless leg syndrome, severe   Right leg swelling   BPH with obstruction/lower urinary tract symptoms   OSA on CPAP   Obesity (BMI 30-39.9)  COVID-19 infection Per daughter via phone and she states the patient had several sick contacts during the holiday season.  Despite lack of respiratory symptoms, cough, fever COVID-19 assay was positive.  I suspect COVID encephalopathy is driving the majority of his symptoms and presentation. Plan: COVID isolation precautions Currently as patient not hypoxic and does not have any signs of pneumonia we will not initiate any COVID-specific treatment or steroids. Supportive care  UTI (urinary tract infection):  pt recently had VRE-UTI. He has been treated with cefuroxime for Proteus mirabilis UTI recently.  Initial urinalysis done on 12/26 positive for leukocytes but negative for bacteria.  Patient still demonstrating signs and symptoms that could be consistent with possible infectious delirium to repeat urinalysis was performed on 12/28.  Interestingly this shows evidence of bacteria although rare.  Urine culture now growing Morganella Morgagni.  Sensitive to flora quinolones.  Not sensitive to ampicillin. Plan: Continue Unasyn given sensitivities Start IV ciprofloxacin Monitor vitals and fever curve   History of CVA (cerebrovascular accident) -Crestor and Plavix -Imaging survey negative for acute CVA   Acute metabolic encephalopathy: Likely multifactorial in nature given new COVID diagnosis, urinary tract infection, advanced age, underlying dementia.  MRI negative for acute CVA.  Mental status has deteriorated since 12/28.  Suspect also component of hospital-acquired delirium at this point.  Fortunately gas is reassuring and does not demonstrate significant hypercarbia or  hypoxia Plan: Upgrade to progressive Stat BiPAP    Hyperlipidemia associated with type 2 diabetes mellitus (HCC) -Crestor   HTN  (hypertension) -IV hydralazine as needed   Type II diabetes mellitus with renal manifestations [facility]): Recent A1c 6.7, well-controlled.  Patient taking NovoLog, semaglutide, Evaristo Bury.  Blood sugar 75 -Will start on Semglee 40 units daily.  Will mental status poor and patient not not eating well we will hold off on the long-acting insulin.  Continue sliding scale and CBGs   Chronic diastolic CHF (congestive heart failure) (HCC): 2D echo on 01/02/2022 showed EF of 55-60%.  BNP normal 29.5.  CHF seem to be compensated. -Watch volume status closely   Chronic kidney disease, stage 3b [facility]): Renal function stable.  Recent baseline creatinine 1.4-1.9.  His creatinine is 1.65, BUN 29, GFR 39 today. -Follow up with BMP   Hypothyroidism -Synthroid   Restless leg syndrome, severe -Pramipexole   Right leg swelling: Patient has asymmetric right leg edema. -Lower extremity venous Doppler negative for VTE   BPH with obstruction/lower urinary tract symptoms -Flomax   OSA - on CPAP   Obesity (BMI 30-39.9): Body weight 83.5 kg, BMI 30.62 -When patient mental status improves, will need to encourage losing weight. -Patient need exercise and healthy diet        DVT prophylaxis: Lovenox Code Status: Full Family Communication: Daughter Lurena Joiner [phone] on 12/27, 12/28, 12/29 Disposition Plan: Status is: Inpatient Remains inpatient appropriate because: Acute metabolic encephalopathy, COVID-19 infection.   Level of care: Progressive  Consultants:  None  Procedures:  None  Antimicrobials: Unasyn   Subjective: Seen and examined.  Very sleepy/lethargic this morning.  Does open eyes upon noxious stimulus.  Unable to provide any history.  Objective: Vitals:   09/16/23 0226 09/16/23 0232 09/16/23 0407 09/16/23 0732  BP:   (!) 98/51 135/64  Pulse:   63 72  Resp: 15  18 17   Temp:  99.9 F (37.7 C) 99.4 F (37.4 C) 98.1 F (36.7 C)  TempSrc:  Rectal Rectal   SpO2:   99% 100%   Weight:      Height:        Intake/Output Summary (Last 24 hours) at 09/16/2023 1107 Last data filed at 09/15/2023 2137 Gross per 24 hour  Intake 240 ml  Output 1150 ml  Net -910 ml   Filed Weights   09/14/23 1200 09/15/23 0500  Weight: 90 kg 89.6 kg    Examination:  General exam: No acute distress Respiratory system: Lungs clear.  Normal work of breathing.  BiPAP cardiovascular system: S1-S2, RRR, no murmurs, no pedal edema Gastrointestinal system: Soft, NT/ND, normal bowel sounds Central nervous system: Lethargic.  Unable to assess orientation Extremities: Unable to assess power.  Gait not assessed. Skin: No rashes, lesions or ulcers Psychiatry: Unable to assess    Data Reviewed: I have personally reviewed following labs and imaging studies  CBC: Recent Labs  Lab 09/13/23 1809 09/14/23 0639 09/16/23 0843  WBC 9.5 7.2 8.7  NEUTROABS 5.7  --  6.4  HGB 12.9* 12.6* 13.4  HCT 39.5 38.9* 40.2  MCV 92.9 92.4 90.7  PLT 215 169 174   Basic Metabolic Panel: Recent Labs  Lab 09/13/23 1809 09/14/23 0639 09/16/23 0843  NA 138 138 136  K 3.4* 3.4* 4.0  CL 106 108 101  CO2 22 23 25   GLUCOSE 95 110* 81  BUN 27* 22 21  CREATININE 1.65* 1.39* 1.66*  CALCIUM 8.9 8.2* 8.4*  MG 2.1  --   --  GFR: Estimated Creatinine Clearance: 32.2 mL/min (A) (by C-G formula based on SCr of 1.66 mg/dL (H)). Liver Function Tests: Recent Labs  Lab 09/13/23 1809  AST 21  ALT 16  ALKPHOS 44  BILITOT 0.5  PROT 7.5  ALBUMIN 3.4*   No results for input(s): "LIPASE", "AMYLASE" in the last 168 hours. No results for input(s): "AMMONIA" in the last 168 hours. Coagulation Profile: Recent Labs  Lab 09/13/23 1809  INR 1.2   Cardiac Enzymes: No results for input(s): "CKTOTAL", "CKMB", "CKMBINDEX", "TROPONINI" in the last 168 hours. BNP (last 3 results) Recent Labs    06/22/23 1320  PROBNP 33.0   HbA1C: No results for input(s): "HGBA1C" in the last 72 hours. CBG: Recent  Labs  Lab 09/15/23 1230 09/15/23 1717 09/15/23 2130 09/16/23 0200 09/16/23 0732  GLUCAP 129* 153* 246* 96 90   Lipid Profile: No results for input(s): "CHOL", "HDL", "LDLCALC", "TRIG", "CHOLHDL", "LDLDIRECT" in the last 72 hours. Thyroid Function Tests: No results for input(s): "TSH", "T4TOTAL", "FREET4", "T3FREE", "THYROIDAB" in the last 72 hours. Anemia Panel: No results for input(s): "VITAMINB12", "FOLATE", "FERRITIN", "TIBC", "IRON", "RETICCTPCT" in the last 72 hours. Sepsis Labs: Recent Labs  Lab 09/14/23 0960  PROCALCITON <0.10    Recent Results (from the past 240 hours)  Microscopic Examination     Status: Abnormal   Collection Time: 09/07/23  1:39 PM   Urine  Result Value Ref Range Status   WBC, UA >30 (A) 0 - 5 /hpf Final   RBC, Urine 11-30 (A) 0 - 2 /hpf Final   Epithelial Cells (non renal) 0-10 0 - 10 /hpf Final   Bacteria, UA Many (A) None seen/Few Final  CULTURE, URINE COMPREHENSIVE     Status: Abnormal   Collection Time: 09/07/23  2:39 PM   Specimen: Urine   UR  Result Value Ref Range Status   Urine Culture, Comprehensive Final report (A)  Final   Organism ID, Bacteria Proteus mirabilis (A)  Final    Comment: Multi-Drug Resistant Organism Cefazolin <=4 ug/mL Cefazolin with an MIC <=16 predicts susceptibility to the oral agents cefaclor, cefdinir, cefpodoxime, cefprozil, cefuroxime, cephalexin, and loracarbef when used for therapy of uncomplicated urinary tract infections due to E. coli, Klebsiella pneumoniae, and Proteus mirabilis. Greater than 100,000 colony forming units per mL    Organism ID, Bacteria Comment  Final    Comment: Mixed urogenital flora 25,000-50,000 colony forming units per mL    ANTIMICROBIAL SUSCEPTIBILITY Comment  Final    Comment:       ** S = Susceptible; I = Intermediate; R = Resistant **                    P = Positive; N = Negative             MICS are expressed in micrograms per mL    Antibiotic                 RSLT#1     RSLT#2    RSLT#3    RSLT#4 Amoxicillin/Clavulanic Acid    S Ampicillin                     S Cefepime                       S Ceftriaxone                    I Cefuroxime  S Ciprofloxacin                  S Ertapenem                      S Gentamicin                     S Levofloxacin                   S Meropenem                      S Nitrofurantoin                 R Piperacillin/Tazobactam        S Tetracycline                   R Tobramycin                     S Trimethoprim/Sulfa             S   Urine Culture     Status: Abnormal (Preliminary result)   Collection Time: 09/13/23 11:22 PM   Specimen: Urine, Random  Result Value Ref Range Status   Specimen Description   Final    URINE, RANDOM Performed at [facility], [address] [place], [place] [address]    Special Requests   Final    NONE Reflexed from [phone] [phone] Performed at [facility], [address] [place], [place] [address]    Culture (A)  Final    50,000 COLONIES/mL MORGANELLA MORGANII 30,000 COLONIES/mL GRAM NEGATIVE RODS IDENTIFICATION AND SUSCEPTIBILITIES TO FOLLOW Performed at [facility], [address], [place], [place] [address]    Report Status PENDING  Incomplete   Organism ID, Bacteria MORGANELLA MORGANII (A)  Final      Susceptibility   Morganella morganii - MIC*    AMPICILLIN >=32 RESISTANT Resistant     CIPROFLOXACIN <=0.25 SENSITIVE Sensitive     GENTAMICIN <=1 SENSITIVE Sensitive     IMIPENEM 4 SENSITIVE Sensitive     NITROFURANTOIN 128 RESISTANT Resistant     TRIMETH/SULFA <=20 SENSITIVE Sensitive     AMPICILLIN/SULBACTAM 16 INTERMEDIATE Intermediate     PIP/TAZO <=4 SENSITIVE Sensitive ug/mL    * 50,000 COLONIES/mL MORGANELLA MORGANII  Respiratory (~20 pathogens) panel by PCR     Status: None   Collection Time: 09/15/23  3:27 PM   Specimen: Nasopharyngeal Swab; Respiratory  Result Value Ref Range Status   Adenovirus NOT DETECTED NOT  DETECTED Final   Coronavirus 229E NOT DETECTED NOT DETECTED Final    Comment: (NOTE) The Coronavirus on the Respiratory Panel, DOES NOT test for the novel  Coronavirus (2019 nCoV)    Coronavirus HKU1 NOT DETECTED NOT DETECTED Final   Coronavirus NL63 NOT DETECTED NOT DETECTED Final   Coronavirus OC43 NOT DETECTED NOT DETECTED Final   Metapneumovirus NOT DETECTED NOT DETECTED Final   Rhinovirus / Enterovirus NOT DETECTED NOT DETECTED Final   Influenza A NOT DETECTED NOT DETECTED Final   Influenza B NOT DETECTED NOT DETECTED Final   Parainfluenza Virus 1 NOT DETECTED NOT DETECTED Final   Parainfluenza Virus 2 NOT DETECTED NOT DETECTED Final   Parainfluenza Virus 3 NOT DETECTED NOT DETECTED Final   Parainfluenza Virus 4 NOT DETECTED NOT DETECTED Final   Respiratory Syncytial Virus NOT DETECTED NOT DETECTED Final   Bordetella  pertussis NOT DETECTED NOT DETECTED Final   Bordetella Parapertussis NOT DETECTED NOT DETECTED Final   Chlamydophila pneumoniae NOT DETECTED NOT DETECTED Final   Mycoplasma pneumoniae NOT DETECTED NOT DETECTED Final    Comment: Performed at [facility], [address] [place], [place] [address]  SARS Coronavirus 2 by RT PCR (hospital order, performed in [facility] hospital lab) *cepheid single result test* Anterior Nasal Swab     Status: Abnormal   Collection Time: 09/15/23  3:27 PM   Specimen: Anterior Nasal Swab  Result Value Ref Range Status   SARS Coronavirus 2 by RT PCR POSITIVE (A) NEGATIVE Final    Comment: (NOTE) SARS-CoV-2 target nucleic acids are DETECTED  SARS-CoV-2 RNA is generally detectable in upper respiratory specimens  during the acute phase of infection.  Positive results are indicative  of the presence of the identified virus, but do not rule out bacterial infection or co-infection with other pathogens not detected by the test.  Clinical correlation with patient history and  other diagnostic information is necessary to determine  patient infection status.  The expected result is negative.  Fact Sheet for Patients:   RoadLapTop.co.za   Fact Sheet for Healthcare Providers:   http://kim-miller.com/    This test is not yet approved or cleared by the [place] FDA and  has been authorized for detection and/or diagnosis of SARS-CoV-2 by FDA under an Emergency Use Authorization (EUA).  This EUA will remain in effect (meaning this test can be used) for the duration of  the COVID-19 declaration under Section 564(b)(1)  of the Act, 21 U.S.C. section 360-bbb-3(b)(1), unless the authorization is terminated or revoked sooner.   Performed at [facility], [address], [place], [place] [address]          Radiology Studies: DG Chest [place] 1 View Result Date: 09/15/2023 CLINICAL DATA:  Cough and COVID-19 positivity, initial encounter EXAM: PORTABLE CHEST 1 VIEW COMPARISON:  10/09/2022 FINDINGS: Cardiac shadow is within normal limits. Aortic calcifications are seen. The lungs are well aerated bilaterally. No focal infiltrate or effusion is seen. No bony abnormality is noted. IMPRESSION: No acute abnormality noted. Electronically Signed   By: Alcide Clever M.D.   On: 09/15/2023 18:58        Scheduled Meds:  brimonidine  1 drop Both Eyes BID   And   timolol  1 drop Both Eyes BID   clopidogrel  75 mg Oral Daily   DULoxetine  60 mg Oral QHS   enoxaparin (LOVENOX) injection  40 mg Subcutaneous Q24H   famotidine  20 mg Oral QHS   gabapentin  600 mg Oral TID WC & HS   insulin aspart  0-5 Units Subcutaneous QHS   insulin aspart  0-9 Units Subcutaneous TID WC   insulin glargine-yfgn  40 Units Subcutaneous Daily   levothyroxine  50 mcg Oral Q0600   pantoprazole  40 mg Oral Daily   potassium chloride  40 mEq Oral Once   pramipexole  1 mg Oral QHS   rosuvastatin  40 mg Oral QHS   saccharomyces boulardii  500 mg Oral BID   sodium chloride flush  3 mL  Intravenous Once   tamsulosin  0.4 mg Oral QPC supper   Continuous Infusions:  ciprofloxacin       LOS: 1 day    Tresa Moore, MD [facility]  CRITICAL CARE Performed by: Tresa Moore   Total critical care time: 32 minutes  Critical care time  was exclusive of separately billable procedures and treating other patients.  Critical care was necessary to treat or prevent imminent or life-threatening deterioration.  Critical care was time spent personally by me on the following activities: development of treatment plan with patient and/or surrogate as well as nursing, discussions with consultants, evaluation of patient's response to treatment, examination of patient, obtaining history from patient or surrogate, ordering and performing treatments and interventions, ordering and review of laboratory studies, ordering and review of radiographic studies, pulse oximetry and re-evaluation of patient's condition.   If 7PM-7AM, please contact night-coverage  09/16/2023, 11:07 AM

## 2023-09-16 NOTE — Plan of Care (Signed)
°  Problem: Education: Goal: Ability to describe self-care measures that may prevent or decrease complications (Diabetes Survival Skills Education) will improve Outcome: Progressing Goal: Individualized Educational Video(s) Outcome: Progressing   Problem: Coping: Goal: Ability to adjust to condition or change in health will improve Outcome: Progressing   Problem: Fluid Volume: Goal: Ability to maintain a balanced intake and output will improve Outcome: Progressing   Problem: Health Behavior/Discharge Planning: Goal: Ability to identify and utilize available resources and services will improve Outcome: Progressing Goal: Ability to manage health-related needs will improve Outcome: Progressing   Problem: Metabolic: Goal: Ability to maintain appropriate glucose levels will improve Outcome: Progressing   Problem: Nutritional: Goal: Maintenance of adequate nutrition will improve Outcome: Progressing Goal: Progress toward achieving an optimal weight will improve Outcome: Progressing   Problem: Skin Integrity: Goal: Risk for impaired skin integrity will decrease Outcome: Progressing   Problem: Tissue Perfusion: Goal: Adequacy of tissue perfusion will improve Outcome: Progressing   Problem: Education: Goal: Knowledge of General Education information will improve Description: Including pain rating scale, medication(s)/side effects and non-pharmacologic comfort measures Outcome: Progressing   Problem: Health Behavior/Discharge Planning: Goal: Ability to manage health-related needs will improve Outcome: Progressing   Problem: Clinical Measurements: Goal: Ability to maintain clinical measurements within normal limits will improve Outcome: Progressing Goal: Diagnostic test results will improve Outcome: Progressing Goal: Cardiovascular complication will be avoided Outcome: Progressing   Problem: Activity: Goal: Risk for activity intolerance will decrease Outcome:  Progressing   Problem: Nutrition: Goal: Adequate nutrition will be maintained Outcome: Progressing   Problem: Coping: Goal: Level of anxiety will decrease Outcome: Progressing   Problem: Elimination: Goal: Will not experience complications related to bowel motility Outcome: Progressing Goal: Will not experience complications related to urinary retention Outcome: Progressing   Problem: Pain Management: Goal: General experience of comfort will improve Outcome: Progressing   Problem: Safety: Goal: Ability to remain free from injury will improve Outcome: Progressing   Problem: Skin Integrity: Goal: Risk for impaired skin integrity will decrease Outcome: Progressing   Problem: Education: Goal: Knowledge of risk factors and measures for prevention of condition will improve Outcome: Progressing   Problem: Coping: Goal: Psychosocial and spiritual needs will be supported Outcome: Progressing   Problem: Respiratory: Goal: Will maintain a patent airway Outcome: Progressing

## 2023-09-16 NOTE — Progress Notes (Signed)
At shift change, Day RN said that Day Charge Nurse tried to transfer the pt to Progressive Care Unit, but there were no beds, so that as soon as a bed is available, the pt will be transferred to PCU.

## 2023-09-16 NOTE — Progress Notes (Addendum)
Assessed patient with primary RN [place]ak. Patient noted to be alert and responsive to voice, breath sounds audible at this time, no need for supplemental O2. Called RT who states they are busy in ED. Called Manuela Schwartz, NP who states we can call rapid and she is not coming. Microbiologist who is on the way to the bedside.  NP Jon Billings to bedside to assess at this time.

## 2023-09-16 NOTE — Progress Notes (Addendum)
31 - RN found patient having pulled off CPAP and purewick and disoriented. VS taken. RR is 28. RN notified NP. Charge Nurse notified. Charge Nurse said she will call RT.  0153 - Charge Nurse said that she contacted RT but she is not immediately available right now.  88 - RN conducted NIH Stroke Scale assessment and found that pt NIH score went from 3 to 7. RN notified NP.  0211 - RT arrived to assess the pt.  0220 - NP arrived and assessed the pt. NP said that pt disorientation can be due to pt's age and the fact that its middle of the night and that abdominal breathing pattern is due to pt's sleep apnea. Given pt's COVID diagnosis and UTI, NP said that pt may be experiencing delirium. NP ordered blood gases.  0454 - NP came to [facility] and said that what drove pt's breathing may be pt's temperature and asked to take blanket off pt. Pt blanket taken off.  0410 - Pt BP 98/51. RN notified NP.

## 2023-09-16 NOTE — Progress Notes (Signed)
° °      CROSS COVER NOTE  NAME: KOREE RAPPLEYE, MD MRN: [id number] DOB : Dec 06, 1930    Concern as stated by nurse / staff   Message received from [facility] RN and paged by charge RN Pt is breathing with his abdominal muscles and has RR of 28. Patient is more disoriented and has pulled out his CPAP and purewick.      Pertinent findings on chart review: 89 year old patient with COVID on day 2 of hospitalization  Assessment and  Interventions   Assessment:    09/16/2023    4:07 AM 09/16/2023    1:42 AM 09/15/2023    9:33 PM  Vitals with BMI  Systolic 98 118 149  Diastolic 51 60 67  Pulse 63 73 86   Temp begining of shift 101.1 Upon arrival to room patient with CPAP back in place, no distress, sats stable and normal abdominal muscle movements considering OSA and use of CPAP. Vitals stable Plan: Obtain rectal temp - still elevated 99.9 Delirium precautions Environmental cooling measures for fever      Donnie Mesa NP [facility] Cross Cover 7pm-7am - check amion for availability Pager [phone]

## 2023-09-17 DIAGNOSIS — U071 COVID-19: Secondary | ICD-10-CM | POA: Diagnosis not present

## 2023-09-17 DIAGNOSIS — R531 Weakness: Secondary | ICD-10-CM

## 2023-09-17 DIAGNOSIS — Z515 Encounter for palliative care: Secondary | ICD-10-CM

## 2023-09-17 DIAGNOSIS — Z8673 Personal history of transient ischemic attack (TIA), and cerebral infarction without residual deficits: Secondary | ICD-10-CM | POA: Diagnosis not present

## 2023-09-17 DIAGNOSIS — N401 Enlarged prostate with lower urinary tract symptoms: Secondary | ICD-10-CM | POA: Diagnosis not present

## 2023-09-17 DIAGNOSIS — N3000 Acute cystitis without hematuria: Secondary | ICD-10-CM | POA: Diagnosis not present

## 2023-09-17 DIAGNOSIS — E1165 Type 2 diabetes mellitus with hyperglycemia: Secondary | ICD-10-CM | POA: Diagnosis not present

## 2023-09-17 LAB — GLUCOSE, CAPILLARY
Glucose-Capillary: 100 mg/dL — ABNORMAL HIGH (ref 70–99)
Glucose-Capillary: 75 mg/dL (ref 70–99)
Glucose-Capillary: 136 mg/dL — ABNORMAL HIGH (ref 70–99)
Glucose-Capillary: 119 mg/dL — ABNORMAL HIGH (ref 70–99)
Glucose-Capillary: 113 mg/dL — ABNORMAL HIGH (ref 70–99)

## 2023-09-17 LAB — CBC WITH DIFFERENTIAL/PLATELET
Abs Immature Granulocytes: 0.02 10*3/uL (ref 0.00–0.07)
Basophils Absolute: 0 10*3/uL (ref 0.0–0.1)
Basophils Relative: 0 %
Eosinophils Absolute: 0.2 10*3/uL (ref 0.0–0.5)
Eosinophils Relative: 3 %
HCT: 35.7 % — ABNORMAL LOW (ref 39.0–52.0)
Hemoglobin: 12.2 g/dL — ABNORMAL LOW (ref 13.0–17.0)
Immature Granulocytes: 0 %
Lymphocytes Relative: 18 %
Lymphs Abs: 1 10*3/uL (ref 0.7–4.0)
MCH: 29.8 pg (ref 26.0–34.0)
MCHC: 34.2 g/dL (ref 30.0–36.0)
MCV: 87.1 fL (ref 80.0–100.0)
Monocytes Absolute: 0.8 10*3/uL (ref 0.1–1.0)
Monocytes Relative: 13 %
Neutro Abs: 3.8 10*3/uL (ref 1.7–7.7)
Neutrophils Relative %: 66 %
Platelets: 146 10*3/uL — ABNORMAL LOW (ref 150–400)
RBC: 4.1 MIL/uL — ABNORMAL LOW (ref 4.22–5.81)
RDW: 13.5 % (ref 11.5–15.5)
WBC: 5.7 10*3/uL (ref 4.0–10.5)
nRBC: 0 % (ref 0.0–0.2)

## 2023-09-17 LAB — BASIC METABOLIC PANEL
Anion gap: 11 (ref 5–15)
BUN: 22 mg/dL (ref 8–23)
CO2: 23 mmol/L (ref 22–32)
Calcium: 7.5 mg/dL — ABNORMAL LOW (ref 8.9–10.3)
Chloride: 101 mmol/L (ref 98–111)
Creatinine, Ser: 1.59 mg/dL — ABNORMAL HIGH (ref 0.61–1.24)
GFR, Estimated: 41 mL/min — ABNORMAL LOW (ref >60)
Glucose, Bld: 80 mg/dL (ref 70–99)
Potassium: 3.6 mmol/L (ref 3.5–5.1)
Sodium: 135 mmol/L (ref 135–145)

## 2023-09-17 LAB — URINE CULTURE: Culture: NO GROWTH

## 2023-09-17 MED ORDER — POLYVINYL ALCOHOL 1.4 % OP SOLN: 2.0000 [drp] | OPHTHALMIC | Status: DC | PRN

## 2023-09-17 MED ORDER — DEXAMETHASONE SODIUM PHOSPHATE 10 MG/ML IJ SOLN
6.0000 mg | INTRAMUSCULAR | Status: DC
  Administered 2023-09-17 – 2023-09-18 (×2): 6 mg via INTRAVENOUS
  Filled 2023-09-17 (×2): qty 1

## 2023-09-17 MED ORDER — INSULIN GLARGINE-YFGN 100 UNIT/ML ~~LOC~~ SOLN
20.0000 [IU] | Freq: Every day | SUBCUTANEOUS | Status: DC
Start: 2023-09-18 — End: 2023-09-24
  Administered 2023-09-18 – 2023-09-22 (×5): 20 [IU] via SUBCUTANEOUS
  Filled 2023-09-17 (×8): qty 0.2

## 2023-09-17 NOTE — Plan of Care (Signed)
  Problem: Education: Goal: Ability to describe self-care measures that may prevent or decrease complications (Diabetes Survival Skills Education) will improve Outcome: Progressing Goal: Individualized Educational Video(s) Outcome: Progressing   Problem: Coping: Goal: Ability to adjust to condition or change in health will improve Outcome: Progressing   Problem: Fluid Volume: Goal: Ability to maintain a balanced intake and output will improve Outcome: Progressing   Problem: Health Behavior/Discharge Planning: Goal: Ability to identify and utilize available resources and services will improve Outcome: Progressing Goal: Ability to manage health-related needs will improve Outcome: Progressing   Problem: Metabolic: Goal: Ability to maintain appropriate glucose levels will improve Outcome: Progressing   Problem: Nutritional: Goal: Maintenance of adequate nutrition will improve Outcome: Progressing Goal: Progress toward achieving an optimal weight will improve Outcome: Progressing   Problem: Skin Integrity: Goal: Risk for impaired skin integrity will decrease Outcome: Progressing   Problem: Tissue Perfusion: Goal: Adequacy of tissue perfusion will improve Outcome: Progressing   Problem: Education: Goal: Knowledge of General Education information will improve Description: Including pain rating scale, medication(s)/side effects and non-pharmacologic comfort measures Outcome: Progressing   Problem: Health Behavior/Discharge Planning: Goal: Ability to manage health-related needs will improve Outcome: Progressing   Problem: Clinical Measurements: Goal: Ability to maintain clinical measurements within normal limits will improve Outcome: Progressing Goal: Will remain free from infection Outcome: Progressing Goal: Diagnostic test results will improve Outcome: Progressing Goal: Respiratory complications will improve Outcome: Progressing Goal: Cardiovascular complication will  be avoided Outcome: Progressing   Problem: Activity: Goal: Risk for activity intolerance will decrease Outcome: Progressing   Problem: Nutrition: Goal: Adequate nutrition will be maintained Outcome: Progressing   Problem: Coping: Goal: Level of anxiety will decrease Outcome: Progressing   Problem: Elimination: Goal: Will not experience complications related to bowel motility Outcome: Progressing Goal: Will not experience complications related to urinary retention Outcome: Progressing   Problem: Pain Management: Goal: General experience of comfort will improve Outcome: Progressing   Problem: Safety: Goal: Ability to remain free from injury will improve Outcome: Progressing   Problem: Skin Integrity: Goal: Risk for impaired skin integrity will decrease Outcome: Progressing   Problem: Education: Goal: Knowledge of risk factors and measures for prevention of condition will improve Outcome: Progressing   Problem: Coping: Goal: Psychosocial and spiritual needs will be supported Outcome: Progressing   Problem: Respiratory: Goal: Will maintain a patent airway Outcome: Progressing Goal: Complications related to the disease process, condition or treatment will be avoided or minimized Outcome: Progressing

## 2023-09-17 NOTE — Plan of Care (Signed)
  Problem: Education: Goal: Ability to describe self-care measures that may prevent or decrease complications (Diabetes Survival Skills Education) will improve Outcome: Progressing Goal: Individualized Educational Video(s) Outcome: Progressing   Problem: Coping: Goal: Ability to adjust to condition or change in health will improve Outcome: Progressing   

## 2023-09-17 NOTE — Progress Notes (Signed)
OT Cancellation Note  Patient Details Name: James ZUO, MD MRN: [id number] DOB: 05-22-1931   Cancelled Treatment:    Reason Eval/Treat Not Completed: Medical issues which prohibited therapy (Pt on continuous Bipap, t/f to PCU, decreasing alertness. Hold today.)  Alvester Morin 09/17/2023, 8:18 AM

## 2023-09-17 NOTE — Inpatient Diabetes Management (Signed)
Inpatient Diabetes Program Recommendations  AACE/ADA: New Consensus Statement on Inpatient Glycemic Control (2015)  Target Ranges:  Prepandial:   less than 140 mg/dL      Peak postprandial:   less than 180 mg/dL (1-2 hours)      Critically ill patients:  140 - 180 mg/dL   Lab Results  Component Value Date   GLUCAP 136 (H) 09/17/2023   HGBA1C 6.7 (H) 02/27/2023    Latest Reference Range & Units 09/16/23 07:32 09/16/23 11:27 09/16/23 11:58 09/16/23 17:02 09/16/23 18:02 09/16/23 19:13 09/16/23 21:57 09/17/23 03:48 09/17/23 08:45  Glucose-Capillary 70 - 99 mg/dL 90 55 (L) U98 given for hypoglycemia 187 (H) 74 84 97 64 (L) 100 (H) 75  (L): Data is abnormally low (H): Data is abnormally high  Latest Reference Range & Units 09/17/23 12:16  Glucose-Capillary 70 - 99 mg/dL 119 (H) Novolog 1 unit given  (H): Data is abnormally high Review of Glycemic Control  Diabetes history: DM2 Outpatient Diabetes medications: Tresiba 90 units daily, Novolog 0-9 units tid with meals, Ozempic 1 mg/weekly Current orders for Inpatient glycemic control: Semglee 40 units daily, Novolog 0-9 units tid, 0-5 units hs correction, Decadron 6 mg q 24 hrs.  Inpatient Diabetes Program Recommendations:   Patient did not receive Semglee yesterday and fasting CBG 75. Noted Decadron 6 mg q 24 hrs. Ordered. Please consider: -Decrease Novolog correction to 0-6 units tid -Adjust decrease in Moran as needed  Thank you, Darel Hong E. Raylie Maddison, RN, MSN, CDCES  Diabetes Coordinator Inpatient Glycemic Control Team Team Pager [phone] (8am-5pm) 09/17/2023 1:28 PM

## 2023-09-17 NOTE — Progress Notes (Signed)
PROGRESS NOTE    [name], MD  ZOX:[id number] DOB: 03/03/1931 DOA: 09/13/2023 PCP: Eustaquio Boyden, MD    Brief Narrative:  89 y.o. male with medical history significant of HLD, DM, dCHF, stroke,/TIA, hypothyroidism, memory loss, dementia, BPH, CKD-3B, obesity, OSA on CPAP, RLS, migraine headache, C. difficile, recent admission due to VRE-UTI,, who presents with weakness, difficulty speaking, confusion.   Patient is very lethargic, history is very limited. Per report, pt is noted to have generalized weakness for unknown amount of time today. He seems to have difficulty speaking. Pt has mild right-sided weakness from previous stroke.  When I saw patient in ED, patient is very lethargic.  He knows his own name, knows that he is in hospital, but is confused about time.  No active nausea, vomiting, diarrhea noted.  No active respiratory distress, active cough noted.  Does not seem to have pain anywhere.  Not sure if patient has symptoms of UTI.   Of note, patient had a positive urinalysis and positive urine culture for Proteus mirabilis on 12/20, patient was started on cefuroxime by urologist.  Due to concerning for new stroke, ED physician discussed with neurologist, Dr. Amada Jupiter who recommended to get CT angio of head and neck and MRI of brain.  CTA of head and neck is negative for LVO.  MRI of brain is negative for stroke.  More agitation/delirium noted on 12/20 8 PM.  Suspect hospital-acquired delirium in the setting of COVID encephalopathy, possible UTI, advanced age, dementia.   Assessment & Plan:   Principal Problem:   UTI (urinary tract infection) Active Problems:   History of CVA (cerebrovascular accident)   Acute metabolic encephalopathy   Hyperlipidemia associated with type 2 diabetes mellitus (HCC)   HTN (hypertension)   Type II diabetes mellitus with renal manifestations (HCC)   Chronic diastolic CHF (congestive heart failure) (HCC)   Chronic kidney disease,  stage 3b (HCC)   Hypothyroidism   Restless leg syndrome, severe   Right leg swelling   BPH with obstruction/lower urinary tract symptoms   OSA on CPAP   Obesity (BMI 30-39.9)   COVID-19  COVID-19 infection Acute hypoxic respiratory failure Per daughter via phone and she states the patient had several sick contacts during the holiday season.  Despite lack of respiratory symptoms, cough, fever COVID-19 assay was positive.  I suspect COVID encephalopathy is driving the majority of his symptoms and presentation.  Patient now hypoxic requiring 3 to 4 L supplemental oxygen Plan: COVID isolation precautions Decadron 6 mg IV daily Supportive care  UTI (urinary tract infection):  pt recently had VRE-UTI. He has been treated with cefuroxime for Proteus mirabilis UTI recently.  Initial urinalysis done on 12/26 positive for leukocytes but negative for bacteria.  Patient still demonstrating signs and symptoms that could be consistent with possible infectious delirium to repeat urinalysis was performed on 12/28.  Interestingly this shows evidence of bacteria although rare.  Urine culture now growing Morganella Morgagni.  Sensitive to flora quinolones.  Not sensitive to ampicillin. Plan: Continue IV ciprofloxacin per sensitivities Monitor vitals and fever curve   History of CVA (cerebrovascular accident) -Crestor and Plavix -Imaging survey negative for acute CVA   Acute metabolic encephalopathy: Likely multifactorial in nature given new COVID diagnosis, urinary tract infection, advanced age, underlying dementia.  MRI negative for acute CVA.  Mental status has deteriorated since 12/28.  Suspect also component of hospital-acquired delirium at this point.  Fortunately gas is reassuring and does not demonstrate significant hypercarbia or hypoxia  Plan: Wean off BiPAP as able Mental status slowly improving    Hyperlipidemia associated with type 2 diabetes mellitus (HCC) -Crestor   HTN  (hypertension) -IV hydralazine as needed   Type II diabetes mellitus with renal manifestations [facility]): Recent A1c 6.7, well-controlled.  Patient taking NovoLog, semaglutide, Evaristo Bury.  Blood sugar 75 -Will start on Semglee 40 units daily.  Will mental status poor and patient not not eating well we will hold off on the long-acting insulin.  Continue sliding scale and CBGs   Chronic diastolic CHF (congestive heart failure) (HCC): 2D echo on 01/02/2022 showed EF of 55-60%.  BNP normal 29.5.  CHF seem to be compensated. -Watch volume status closely   Chronic kidney disease, stage 3b [facility]): Renal function stable.  Recent baseline creatinine 1.4-1.9.  His creatinine is 1.65, BUN 29, GFR 39 today. -Follow up with BMP   Hypothyroidism -Synthroid   Restless leg syndrome, severe -Pramipexole   Right leg swelling: Patient has asymmetric right leg edema. -Lower extremity venous Doppler negative for VTE   BPH with obstruction/lower urinary tract symptoms -Flomax   OSA - on CPAP   Obesity (BMI 30-39.9): Body weight 83.5 kg, BMI 30.62 -When patient mental status improves, will need to encourage losing weight. -Patient need exercise and healthy diet        DVT prophylaxis: Lovenox Code Status: Full Family Communication: Daughter Lurena Joiner [phone] on 12/27, 12/28, 12/29, 12/30 Disposition Plan: Status is: Inpatient Remains inpatient appropriate because: Acute metabolic encephalopathy, COVID-19 infection.   Level of care: Progressive  Consultants:  None  Procedures:  None  Antimicrobials: Unasyn   Subjective: Seen and examined.  Remains fatigued but communicative and able to provide history.  Objective: Vitals:   09/16/23 2000 09/17/23 0031 09/17/23 0417 09/17/23 0844  BP: (!) 125/54 (!) 106/45 (!) 112/48 (!) 111/48  Pulse: 73 79 67 66  Resp: 18 20 18 18   Temp: 97.8 F (36.6 C) 98.4 F (36.9 C) 98 F (36.7 C) 98.2 F (36.8 C)  TempSrc:   Oral   SpO2: 92% 97% 97% 98%   Weight:      Height:        Intake/Output Summary (Last 24 hours) at 09/17/2023 1118 Last data filed at 09/17/2023 1000 Gross per 24 hour  Intake 1349.36 ml  Output 1000 ml  Net 349.36 ml   Filed Weights   09/14/23 1200 09/15/23 0500  Weight: 90 kg 89.6 kg    Examination:  General exam: Appears fatigued Respiratory system: Scattered rales bilaterally.  Normal work of breathing.  4 L cardiovascular system: S1-S2, RRR, no murmurs, no pedal edema Gastrointestinal system: Soft, NT/ND, normal bowel sounds Central nervous system: Lethargic.  Unable to assess orientation Extremities: Unable to assess power.  Gait not assessed. Skin: No rashes, lesions or ulcers Psychiatry: Unable to assess    Data Reviewed: I have personally reviewed following labs and imaging studies  CBC: Recent Labs  Lab 09/13/23 1809 09/14/23 0639 09/16/23 0843 09/17/23 0831  WBC 9.5 7.2 8.7 5.7  NEUTROABS 5.7  --  6.4 3.8  HGB 12.9* 12.6* 13.4 12.2*  HCT 39.5 38.9* 40.2 35.7*  MCV 92.9 92.4 90.7 87.1  PLT 215 169 174 146*   Basic Metabolic Panel: Recent Labs  Lab 09/13/23 1809 09/14/23 0639 09/16/23 0843 09/17/23 0831  NA 138 138 136 135  K 3.4* 3.4* 4.0 3.6  CL 106 108 101 101  CO2 22 23 25 23   GLUCOSE 95 110* 81 80  BUN 27* 22  21 22  CREATININE 1.65* 1.39* 1.66* 1.59*  CALCIUM 8.9 8.2* 8.4* 7.5*  MG 2.1  --   --   --    GFR: Estimated Creatinine Clearance: 33.6 mL/min (A) (by C-G formula based on SCr of 1.59 mg/dL (H)). Liver Function Tests: Recent Labs  Lab 09/13/23 1809  AST 21  ALT 16  ALKPHOS 44  BILITOT 0.5  PROT 7.5  ALBUMIN 3.4*   No results for input(s): "LIPASE", "AMYLASE" in the last 168 hours. No results for input(s): "AMMONIA" in the last 168 hours. Coagulation Profile: Recent Labs  Lab 09/13/23 1809  INR 1.2   Cardiac Enzymes: No results for input(s): "CKTOTAL", "CKMB", "CKMBINDEX", "TROPONINI" in the last 168 hours. BNP (last 3 results) Recent Labs     06/22/23 1320  PROBNP 33.0   HbA1C: No results for input(s): "HGBA1C" in the last 72 hours. CBG: Recent Labs  Lab 09/16/23 1802 09/16/23 1913 09/16/23 2157 09/17/23 0348 09/17/23 0845  GLUCAP 84 97 64* 100* 75   Lipid Profile: No results for input(s): "CHOL", "HDL", "LDLCALC", "TRIG", "CHOLHDL", "LDLDIRECT" in the last 72 hours. Thyroid Function Tests: No results for input(s): "TSH", "T4TOTAL", "FREET4", "T3FREE", "THYROIDAB" in the last 72 hours. Anemia Panel: No results for input(s): "VITAMINB12", "FOLATE", "FERRITIN", "TIBC", "IRON", "RETICCTPCT" in the last 72 hours. Sepsis Labs: Recent Labs  Lab 09/14/23 4098  PROCALCITON <0.10    Recent Results (from the past 240 hours)  Microscopic Examination     Status: Abnormal   Collection Time: 09/07/23  1:39 PM   Urine  Result Value Ref Range Status   WBC, UA >30 (A) 0 - 5 /hpf Final   RBC, Urine 11-30 (A) 0 - 2 /hpf Final   Epithelial Cells (non renal) 0-10 0 - 10 /hpf Final   Bacteria, UA Many (A) None seen/Few Final  CULTURE, URINE COMPREHENSIVE     Status: Abnormal   Collection Time: 09/07/23  2:39 PM   Specimen: Urine   UR  Result Value Ref Range Status   Urine Culture, Comprehensive Final report (A)  Final   Organism ID, Bacteria Proteus mirabilis (A)  Final    Comment: Multi-Drug Resistant Organism Cefazolin <=4 ug/mL Cefazolin with an MIC <=16 predicts susceptibility to the oral agents cefaclor, cefdinir, cefpodoxime, cefprozil, cefuroxime, cephalexin, and loracarbef when used for therapy of uncomplicated urinary tract infections due to E. coli, Klebsiella pneumoniae, and Proteus mirabilis. Greater than 100,000 colony forming units per mL    Organism ID, Bacteria Comment  Final    Comment: Mixed urogenital flora 25,000-50,000 colony forming units per mL    ANTIMICROBIAL SUSCEPTIBILITY Comment  Final    Comment:       ** S = Susceptible; I = Intermediate; R = Resistant **                    P =  Positive; N = Negative             MICS are expressed in micrograms per mL    Antibiotic                 RSLT#1    RSLT#2    RSLT#3    RSLT#4 Amoxicillin/Clavulanic Acid    S Ampicillin                     S Cefepime  S Ceftriaxone                    I Cefuroxime                     S Ciprofloxacin                  S Ertapenem                      S Gentamicin                     S Levofloxacin                   S Meropenem                      S Nitrofurantoin                 R Piperacillin/Tazobactam        S Tetracycline                   R Tobramycin                     S Trimethoprim/Sulfa             S   Urine Culture     Status: Abnormal   Collection Time: 09/13/23 11:22 PM   Specimen: Urine, Random  Result Value Ref Range Status   Specimen Description   Final    URINE, RANDOM Performed at [facility], [address] [place], [place] [address]    Special Requests   Final    NONE Reflexed from [id number] Performed at [facility], [address] [place], [place] [address]    Culture (A)  Final    50,000 COLONIES/mL MORGANELLA MORGANII 30,000 COLONIES/mL PROTEUS MIRABILIS    Report Status 09/16/2023 FINAL  Final   Organism ID, Bacteria MORGANELLA MORGANII (A)  Final   Organism ID, Bacteria PROTEUS MIRABILIS (A)  Final      Susceptibility   Morganella morganii - MIC*    AMPICILLIN >=32 RESISTANT Resistant     CIPROFLOXACIN <=0.25 SENSITIVE Sensitive     GENTAMICIN <=1 SENSITIVE Sensitive     IMIPENEM 4 SENSITIVE Sensitive     NITROFURANTOIN 128 RESISTANT Resistant     TRIMETH/SULFA <=20 SENSITIVE Sensitive     AMPICILLIN/SULBACTAM 16 INTERMEDIATE Intermediate     PIP/TAZO <=4 SENSITIVE Sensitive ug/mL    * 50,000 COLONIES/mL MORGANELLA MORGANII   Proteus mirabilis - MIC*    AMPICILLIN <=2 SENSITIVE Sensitive     CEFAZOLIN 8 SENSITIVE Sensitive     CEFEPIME <=0.12 SENSITIVE Sensitive     CEFTRIAXONE <=0.25 SENSITIVE  Sensitive     CIPROFLOXACIN <=0.25 SENSITIVE Sensitive     GENTAMICIN <=1 SENSITIVE Sensitive     IMIPENEM 4 SENSITIVE Sensitive     NITROFURANTOIN 128 RESISTANT Resistant     TRIMETH/SULFA <=20 SENSITIVE Sensitive     AMPICILLIN/SULBACTAM <=2 SENSITIVE Sensitive     PIP/TAZO <=4 SENSITIVE Sensitive ug/mL    * 30,000 COLONIES/mL PROTEUS MIRABILIS  Urine Culture (for pregnant, neutropenic or urologic patients or patients with an indwelling urinary catheter)     Status: None   Collection Time: 09/15/23  2:14 PM   Specimen: Urine, Random  Result Value Ref Range Status   Specimen Description   Final    URINE, RANDOM  Performed at [facility], [address], [place], [place] [address]    Special Requests   Final    NONE Performed at [facility], [address], [place], [place] [address]    Culture   Final    NO GROWTH Performed at [facility], [address] [place], [place] [address]    Report Status 09/17/2023 FINAL  Final  Respiratory (~20 pathogens) panel by PCR     Status: None   Collection Time: 09/15/23  3:27 PM   Specimen: Nasopharyngeal Swab; Respiratory  Result Value Ref Range Status   Adenovirus NOT DETECTED NOT DETECTED Final   Coronavirus 229E NOT DETECTED NOT DETECTED Final    Comment: (NOTE) The Coronavirus on the Respiratory Panel, DOES NOT test for the novel  Coronavirus (2019 nCoV)    Coronavirus HKU1 NOT DETECTED NOT DETECTED Final   Coronavirus NL63 NOT DETECTED NOT DETECTED Final   Coronavirus OC43 NOT DETECTED NOT DETECTED Final   Metapneumovirus NOT DETECTED NOT DETECTED Final   Rhinovirus / Enterovirus NOT DETECTED NOT DETECTED Final   Influenza A NOT DETECTED NOT DETECTED Final   Influenza B NOT DETECTED NOT DETECTED Final   Parainfluenza Virus 1 NOT DETECTED NOT DETECTED Final   Parainfluenza Virus 2 NOT DETECTED NOT DETECTED Final   Parainfluenza Virus 3 NOT DETECTED NOT DETECTED Final   Parainfluenza Virus 4 NOT  DETECTED NOT DETECTED Final   Respiratory Syncytial Virus NOT DETECTED NOT DETECTED Final   Bordetella pertussis NOT DETECTED NOT DETECTED Final   Bordetella Parapertussis NOT DETECTED NOT DETECTED Final   Chlamydophila pneumoniae NOT DETECTED NOT DETECTED Final   Mycoplasma pneumoniae NOT DETECTED NOT DETECTED Final    Comment: Performed at [facility], [address], [place], [place] [address]  SARS Coronavirus 2 by RT PCR (hospital order, performed in [facility] hospital lab) *cepheid single result test* Anterior Nasal Swab     Status: Abnormal   Collection Time: 09/15/23  3:27 PM   Specimen: Anterior Nasal Swab  Result Value Ref Range Status   SARS Coronavirus 2 by RT PCR POSITIVE (A) NEGATIVE Final    Comment: (NOTE) SARS-CoV-2 target nucleic acids are DETECTED  SARS-CoV-2 RNA is generally detectable in upper respiratory specimens  during the acute phase of infection.  Positive results are indicative  of the presence of the identified virus, but do not rule out bacterial infection or co-infection with other pathogens not detected by the test.  Clinical correlation with patient history and  other diagnostic information is necessary to determine patient infection status.  The expected result is negative.  Fact Sheet for Patients:   RoadLapTop.co.za   Fact Sheet for Healthcare Providers:   http://kim-miller.com/    This test is not yet approved or cleared by the [place] FDA and  has been authorized for detection and/or diagnosis of SARS-CoV-2 by FDA under an Emergency Use Authorization (EUA).  This EUA will remain in effect (meaning this test can be used) for the duration of  the COVID-19 declaration under Section 564(b)(1)  of the Act, 21 U.S.C. section 360-bbb-3(b)(1), unless the authorization is terminated or revoked sooner.   Performed at [facility], [address] [place], [place] [address]           Radiology Studies: [facility] Chest [place] 1 View Result Date: 09/15/2023 CLINICAL DATA:  Cough and COVID-19 positivity, initial encounter EXAM: PORTABLE CHEST 1 VIEW COMPARISON:  10/09/2022 FINDINGS: Cardiac shadow is within  normal limits. Aortic calcifications are seen. The lungs are well aerated bilaterally. No focal infiltrate or effusion is seen. No bony abnormality is noted. IMPRESSION: No acute abnormality noted. Electronically Signed   By: Alcide Clever M.D.   On: 09/15/2023 18:58        Scheduled Meds:  brimonidine  1 drop Both Eyes BID   And   timolol  1 drop Both Eyes BID   clopidogrel  75 mg Oral Daily   DULoxetine  60 mg Oral QHS   enoxaparin (LOVENOX) injection  40 mg Subcutaneous Q24H   famotidine  20 mg Oral QHS   insulin aspart  0-5 Units Subcutaneous QHS   insulin aspart  0-9 Units Subcutaneous TID WC   insulin glargine-yfgn  40 Units Subcutaneous Daily   levothyroxine  50 mcg Oral Q0600   pantoprazole  40 mg Oral Daily   potassium chloride  40 mEq Oral Once   pramipexole  1 mg Oral QHS   rosuvastatin  40 mg Oral QHS   saccharomyces boulardii  500 mg Oral BID   sodium chloride flush  3 mL Intravenous Once   tamsulosin  0.4 mg Oral QPC supper   Continuous Infusions:  ciprofloxacin Stopped (09/17/23 0119)   dextrose 5 % and 0.9 % NaCl 40 mL/hr at 09/17/23 1000     LOS: 1 day    Tresa Moore, MD [facility]    If 7PM-7AM, please contact night-coverage  09/17/2023, 11:18 AM

## 2023-09-17 NOTE — Consult Note (Cosign Needed)
Consultation Note Date: 09/17/2023 at 1530  Patient Name: James Sarks, MD  DOB: [birth year]  MRN: [id number]  Age / Sex: 89 y.o., male  PCP: Eustaquio Boyden, MD Referring Physician: Tresa Moore, MD  HPI/Patient Profile: 89 y.o. male  with past medical history of HLD, DM, dCHF, stroke,/TIA, hypothyroidism, memory loss, dementia, BPH, CKD-3B, obesity, OSA on CPAP, RLS, migraine headache, C. difficile, recent admission due to VRE-UTI  admitted on 09/13/2023 with weakness, difficulty speaking, and confusion.   Patient found to have positive urinalysis and positive urine culture for Proteus Mirabella's.  Due to concern for new stroke, ED physician discussed with neurologist Dr. Alonna Minium recommended CT angio of head and neck and MRI brain.  CTA of head and neck as well as MRI of brain are negative/WNL.  Patient found to be COVID-positive.  Patient is being treated for COVID and covid.  PMT was consulted to discuss GOC.   Clinical Assessment and Goals of Care: Extensive chart review completed prior to meeting patient including labs, vital signs, imaging, progress notes, orders, and available advanced directive documents from current and previous encounters. I then met with patient at bedside  to discuss diagnosis prognosis, GOC, EOL wishes, disposition and options.  I introduced Palliative Medicine as specialized medical care for people living with serious illness. It focuses on providing relief from the symptoms and stress of a serious illness. The goal is to improve quality of life for both the patient and the family.  We discussed a brief life review of the patient.  Patient is a retired Stage manager.  He has 2 daughters who live nearby.  He relies on his independent living facility to assist with ADLs.  He endorses feeling a decline in his physical state that was rapid  over the last 6 weeks.  We discussed patient's current illness-UTI and COVID-and what it means in the larger context of patient's on-going co-morbidities-advanced age and declining functional status.  I attempted to elicit values and goals of care important to the patient.  I referenced patient's advanced directives on file.  He shares that he has written down his wishes so that it is clear to his medical team what he would and would not want.  Full code versus DNR status discussed in detail.  Patient shares he is a "full DNR".  He would never be accepting of ventilatory support or artificial nutrition/hydration.  He is surprised that he is not already "marked as a DNR".  Symptoms assessed.  Patient shares that all that is really bothering him is that he does not quite feel like himself though "I am coming around".  He endorses urinary frequency with incontinence.  Discussed use of external catheter to assist with staying dry.  He is eager to have this placed.  He does not remember that he had 1 placed and removed it himself.  Notified RN to place external catheter.  No other adjustment to [facility] needed at this time.  With patient's permission, after visiting with patient, I  spoke with his daughter Lurena Joiner over the phone.  Conveyed above discussion.  Lurena Joiner also endorses that patient is a DNR with limited interventions.  She herself was surprised that he is not already a DNR in his medical record.  CODE STATUS changed to DNR.  RN and attending made aware.   Discussed with patient/family the importance of continued conversation with family and the medical providers regarding overall plan of care and treatment options, ensuring decisions are within the context of the patient's values and GOCs.  Lurena Joiner shares she would like to visit and do what ever she can to help her father get better.  She shares she plans to visit tomorrow.  Advised her of restrictions for visiting a COVID-positive patient.    Questions and concerns were addressed. The family was encouraged to call with questions or concerns.   PMT will continue to follow and support patient and family throughout his hospitalization.  Primary Decision Maker PATIENT  Physical Exam Vitals reviewed.  Constitutional:      General: He is not in acute distress.    Appearance: He is normal weight.  HENT:     Head: Normocephalic.     Mouth/Throat:     Mouth: Mucous membranes are moist.  Eyes:     Pupils: Pupils are equal, round, and reactive to light.  Pulmonary:     Effort: Pulmonary effort is normal.  Abdominal:     Palpations: Abdomen is soft.  Musculoskeletal:     Comments: Generalized weakness  Skin:    General: Skin is warm and dry.  Neurological:     Mental Status: He is alert and oriented to person, place, and time.  Psychiatric:        Mood and Affect: Mood normal.        Behavior: Behavior normal.        Thought Content: Thought content normal.        Judgment: Judgment normal.     Palliative Assessment/Data: 50%     Thank you for this consult. Palliative medicine will continue to follow and assist holistically.   Time Total: 75 minutes  Time spent includes: Detailed review of medical records (labs, imaging, vital signs), medically appropriate exam (mental status, respiratory, cardiac, skin), discussed with treatment team, counseling and educating patient, family and staff, documenting clinical information, medication management and coordination of care.  Signed by: Georgiann Cocker, DNP, FNP-BC Palliative Medicine   Please contact Palliative Medicine Team providers via [facility] for questions and concerns.

## 2023-09-18 DIAGNOSIS — U071 COVID-19: Secondary | ICD-10-CM | POA: Diagnosis not present

## 2023-09-18 DIAGNOSIS — Z515 Encounter for palliative care: Secondary | ICD-10-CM | POA: Diagnosis not present

## 2023-09-18 DIAGNOSIS — G9341 Metabolic encephalopathy: Secondary | ICD-10-CM | POA: Diagnosis not present

## 2023-09-18 DIAGNOSIS — N401 Enlarged prostate with lower urinary tract symptoms: Secondary | ICD-10-CM | POA: Diagnosis not present

## 2023-09-18 DIAGNOSIS — R531 Weakness: Secondary | ICD-10-CM | POA: Diagnosis not present

## 2023-09-18 LAB — GLUCOSE, CAPILLARY
Glucose-Capillary: 155 mg/dL — ABNORMAL HIGH (ref 70–99)
Glucose-Capillary: 194 mg/dL — ABNORMAL HIGH (ref 70–99)
Glucose-Capillary: 221 mg/dL — ABNORMAL HIGH (ref 70–99)
Glucose-Capillary: 175 mg/dL — ABNORMAL HIGH (ref 70–99)

## 2023-09-18 NOTE — Evaluation (Signed)
 Occupational Therapy Re-Evaluation Patient Details Name: James OLDAKER, MD MRN: [id number] DOB: [birth year] Today's Date: 09/18/2023   History of Present Illness Pt is a 89 year old male presenting to ED with weakness, difficulty speaking, confusion; work up includes UTI, acute metabolic encephalopathy, and Covid+.  PMH significant for HLD, DM, dCHF, stroke,/TIA, hypothyroidism, memory loss, dementia, BPH, CKD-3B, obesity, OSA on CPAP, RLS, migraine headache, C. difficile, recent admission due to VRE-UTI. T/f to PCU 12/30.   Clinical Impression   Re-evaluation completed as pt t/f to higher level of care. MIN-MOD A t/f (more assist to the R than to the L due to old CVA) onto [facility] over toilet with pt using LUE on grab bar in bathroom. Seated grooming.  Patient will benefit from continued OT while in acute care.       If plan is discharge home, recommend the following: A lot of help with walking and/or transfers;A little help with bathing/dressing/bathroom;Assistance with cooking/housework;Direct supervision/assist for medications management;Direct supervision/assist for financial management;Assist for transportation    Functional Status Assessment  Patient has had a recent decline in their functional status and demonstrates the ability to make significant improvements in function in a reasonable and predictable amount of time.  Equipment Recommendations       Recommendations for Other Services       Precautions / Restrictions Precautions Precautions: Fall Precaution Comments: watch O2 Restrictions Weight Bearing Restrictions Per Provider Order: No      Mobility Bed Mobility               General bed mobility comments: not tested, pt received & left sitting in recliner    Transfers Overall transfer level: Needs assistance Equipment used: 1 person hand held assist (grab bar by toilet) Transfers: Bed to chair/wheelchair/BSC     Squat pivot transfers: Min assist, Mod  assist       General transfer comment: Transferred to toilet and back; MIN A towards strong side; MOD A towards weak side; heavy reliance of LUE on grab bar.      Balance Overall balance assessment: Needs assistance Sitting-balance support: Bilateral upper extremity supported Sitting balance-Leahy Scale: Good     Standing balance support: During functional activity, Single extremity supported Standing balance-Leahy Scale: Poor                             ADL either performed or assessed with clinical judgement   ADL Overall ADL's : Needs assistance/impaired     Grooming: Oral care;Wash/dry face;Brushing hair;Set up;Supervision/safety;Sitting Grooming Details (indicate cue type and reason): seated in recliner at sink/mirror; used basin for spitting after oral care.             Lower Body Dressing: Sitting/lateral leans;Maximal assistance Lower Body Dressing Details (indicate cue type and reason): OT assisted MAX A to don shoes while on toilet. Toilet Transfer: Minimal assistance;Moderate assistance;Stand-pivot;BSC/3in1;Grab bars Toilet Transfer Details (indicate cue type and reason): MIN A t/f to the L with grab bar from w/c to [facility] over toilet in bathroom; MOD A t/f to the R with grab bar (pt using strong LUE for grab bar; OT provided HHA in RUE and support at hips). Used shoes for second t/f (back to chair) and pt stated he felt more comfortable with shoes on during t/f. Toileting- Clothing Manipulation and Hygiene: Total assistance;Sit to/from stand Toileting - Clothing Manipulation Details (indicate cue type and reason): Pt stood with LUE on grab bar and  OT assisted dependently for bowel hygiene.     Functional mobility during ADLs: Moderate assistance (grab bar, transfer only) General ADL Comments: states he feels weak; did well with grab bar     Vision Patient Visual Report: No change from baseline       Perception         Praxis         Pertinent  Vitals/Pain Pain Assessment Pain Assessment: No/denies pain     Extremity/Trunk Assessment Upper Extremity Assessment Upper Extremity Assessment: RUE deficits/detail RUE Deficits / Details: baseline R sided deficits, pt reports unchanged ; LUE grossly 4/5 throughout   Lower Extremity Assessment Lower Extremity Assessment: Defer to PT evaluation;Generalized weakness;RLE deficits/detail RLE Deficits / Details: hx CVA   Cervical / Trunk Assessment Cervical / Trunk Assessment: Kyphotic   Communication Communication Communication: No apparent difficulties   Cognition Arousal: Alert Behavior During Therapy: WFL for tasks assessed/performed Overall Cognitive Status: Within Functional Limits for tasks assessed                                 General Comments: Able to make all needs known; follows commands well. Orientation not explicitly tested.     General Comments  Pt on room air; good O2 sat.    Exercises     Shoulder Instructions      Home Living Family/patient expects to be discharged to:: Other (Comment) [facility] IL[facility]) Living Arrangements: Alone Available Help at Discharge: Family;Available PRN/intermittently;Personal care attendant Type of Home: Apartment Home Access: Level entry     Home Layout: One level     Bathroom Shower/Tub: Tub/shower unit     Bathroom Accessibility: Yes   Home Equipment: Grab bars - toilet;Grab bars - tub/shower;Tub bench;BSC/3in1;Wheelchair - power   Additional Comments: [facility] ILF. Has WIS with tub bench and grab bars; standard height toilet with riser and grab bars.  has power chair. Adjustable bed, BSC (but it is too tippy to use effectively). He has assistance from Aides daily for 3 hours in the morning to help with IADL's and some ADLs. He states he showers while his aide is present. POA states he lives alone and needs to be able to function alone to go home by himself. They can arrange for someone to also stay  the night with him but need some time to get it arranged.      Prior Functioning/Environment Prior Level of Function : Needs assist             Mobility Comments: MOD I with SPT to/from power wheelchair out of left side of bed (patients left side), to the left, return to bed to the R into L side of bed. Pt reports working with PT and OT at ILF. Fall history. ADLs Comments: MOD I with dressing, feeding, grooming, bathing and toileting with supervision, Pt has meals in cafeteria        OT Problem List: Decreased strength;Decreased activity tolerance;Decreased knowledge of use of DME or AE;Impaired balance (sitting and/or standing);Decreased safety awareness;Decreased cognition      OT Treatment/Interventions: Self-care/ADL training;Therapeutic exercise;Patient/family education;Balance training;Energy conservation;Therapeutic activities;DME and/or AE instruction;Cognitive remediation/compensation    OT Goals(Current goals can be found in the care plan section) Acute Rehab OT Goals Patient Stated Goal: Get better; go home OT Goal Formulation: With patient Time For Goal Achievement: 10/02/23 Potential to Achieve Goals: Fair  OT Frequency: Min 1X/week    Co-evaluation  AM-PAC OT 6 Clicks Daily Activity     Outcome Measure Help from another person eating meals?: None Help from another person taking care of personal grooming?: None Help from another person toileting, which includes using toliet, bedpan, or urinal?: A Lot Help from another person bathing (including washing, rinsing, drying)?: A Lot Help from another person to put on and taking off regular upper body clothing?: A Little Help from another person to put on and taking off regular lower body clothing?: A Lot 6 Click Score: 17   End of Session Equipment Utilized During Treatment:  (grab bar, BSC) Nurse Communication: Mobility status  Activity Tolerance: Patient tolerated treatment well;Patient limited  by fatigue Patient left: in chair;with call bell/phone within reach;with nursing/sitter in room (RN in room giving medications)  OT Visit Diagnosis: Other abnormalities of gait and mobility (R26.89)                Time: 8953-8876 OT Time Calculation (min): 37 min Charges:  OT General Charges $OT Visit: 1 Visit OT Evaluation $OT Re-eval: 1 Re-eval OT Treatments $Self Care/Home Management : 23-37 mins  Jasmine Arlean Shams, MS, OTR/L  Jasmine Shams 09/18/2023, 11:40 AM

## 2023-09-18 NOTE — Progress Notes (Addendum)
 Palliative Care Progress Note, Assessment & Plan   Patient Name: James GORMAN Fearing, MD       Date: 09/18/2023 DOB: February 24, 1931  Age: 89 y.o. MRN#: [id number] Attending Physician: Jhonny Calvin NOVAK, MD Primary Care Physician: Rilla Baller, MD Admit Date: 09/13/2023  Subjective: Patient is out of bed performing ADLs at sink with therapist during my visit.  No acute complaints at this time.  No family or friends present during my visit.  HPI: 89 y.o. male  with past medical history of HLD, DM, dCHF, stroke,/TIA, hypothyroidism, memory loss, dementia, BPH, CKD-3B, obesity, OSA on CPAP, RLS, migraine headache, C. difficile, recent admission due to VRE-UTI  admitted on 09/13/2023 with weakness, difficulty speaking, and confusion.    Patient found to have positive urinalysis and positive urine culture for Proteus Miribilis.   Due to concern for new stroke, ED physician discussed with neurologist Dr. Audrie recommended CT angio of head and neck and MRI brain.  CTA of head and neck as well as MRI of brain are negative/WNL.   Patient found to be COVID-positive.  Patient is being treated for COVID and UTI.   PMT was consulted to discuss GOC.   Summary of counseling/coordination of care: Extensive chart review completed prior to meeting patient including labs, vital signs, imaging, progress notes, orders, and available advanced directive documents from current and previous encounters.   After reviewing the patient's chart and assessing the patient at bedside, I counseled with dayshift RN Rankin.  He confirms that patient continues to have incontinence of both urine and stool.  External urinary catheter has helped keep patient dry with urine.  Patient has no complaints as per RN.  No adjustment to  more needed at this time.  I requested that RN notify me if family comes to visit.  Patient's daughter plan to visit today.  PMT remains available to patient and family throughout his hospitalization.  Addendum: Received call from patient's daughter.  She plan to visit today but now believes she has COVID.  She does not want to expose her father to COVID or what ever illness she has.  Medical update given over the phone.  Discussed patient continues to show improvement with stable vital signs and lab work.  He continues to work with PT and OT.  She endorses she remains in agreement for patient to go to SNF for rehab once he is medically stable and able to discharge from hospital.  Ultimately, goal is for him to return to his independent living facility if feasible.  Discussed that patient may potentially need to stay in hospital to clear COVID isolation.  However, once discharge planning begins, facilities will be determining length of isolation precautions needed.  Questions and concerns were addressed.   Physical Exam Vitals reviewed.  Constitutional:      General: He is not in acute distress.    Appearance: He is normal weight.  HENT:     Head: Normocephalic.  Eyes:     Pupils: Pupils are equal, round, and reactive to light.  Pulmonary:     Effort: Pulmonary effort is normal.  Skin:    General: Skin is dry.  Neurological:     Mental Status:  He is alert and oriented to person, place, and time.  Psychiatric:        Mood and Affect: Mood normal.        Behavior: Behavior normal.        Thought Content: Thought content normal.        Judgment: Judgment normal.             Total Time 35 minutes   Time spent includes: Detailed review of medical records (labs, imaging, vital signs), medically appropriate exam (mental status, respiratory, cardiac, skin), discussed with treatment team, counseling and educating patient, family and staff, documenting clinical information, medication  management and coordination of care.  [name], DNP, FNP-BC Palliative Medicine Team

## 2023-09-18 NOTE — Progress Notes (Signed)
 PROGRESS NOTE    James GORMAN Fearing, MD  FMW:[id number] DOB: Feb 21, 1931 DOA: 09/13/2023 PCP: Rilla Baller, MD    Brief Narrative:  89 y.o. male with medical history significant of HLD, DM, dCHF, stroke,/TIA, hypothyroidism, memory loss, dementia, BPH, CKD-3B, obesity, OSA on CPAP, RLS, migraine headache, C. difficile, recent admission due to VRE-UTI,, who presents with weakness, difficulty speaking, confusion.   Patient is very lethargic, history is very limited. Per report, pt is noted to have generalized weakness for unknown amount of time today. He seems to have difficulty speaking. Pt has mild right-sided weakness from previous stroke.  When I saw patient in ED, patient is very lethargic.  He knows his own name, knows that he is in hospital, but is confused about time.  No active nausea, vomiting, diarrhea noted.  No active respiratory distress, active cough noted.  Does not seem to have pain anywhere.  Not sure if patient has symptoms of UTI.   Of note, patient had a positive urinalysis and positive urine culture for Proteus mirabilis on 12/20, patient was started on cefuroxime  by urologist.  Due to concerning for new stroke, ED physician discussed with neurologist, Dr. Michaela who recommended to get CT angio of head and neck and MRI of brain.  CTA of head and neck is negative for LVO.  MRI of brain is negative for stroke.  More agitation/delirium noted on 12/20 8 PM.  Suspect hospital-acquired delirium in the setting of COVID encephalopathy, possible UTI, advanced age, dementia.  Mental status improved on 12/31.  Patient communicative.  Endorses fatigue.   Assessment & Plan:   Principal Problem:   UTI (urinary tract infection) Active Problems:   History of CVA (cerebrovascular accident)   Acute metabolic encephalopathy   Hyperlipidemia associated with type 2 diabetes mellitus (HCC)   HTN (hypertension)   Type II diabetes mellitus with renal manifestations (HCC)   Chronic  diastolic CHF (congestive heart failure) (HCC)   Chronic kidney disease, stage 3b (HCC)   Hypothyroidism   Restless leg syndrome, severe   Right leg swelling   BPH with obstruction/lower urinary tract symptoms   OSA on CPAP   Obesity (BMI 30-39.9)   COVID-19  COVID-19 infection Acute hypoxic respiratory failure Per daughter via phone and she states the patient had several sick contacts during the holiday season.  Despite lack of respiratory symptoms, cough, fever COVID-19 assay was positive.  I suspect COVID encephalopathy is driving the majority of his symptoms and presentation.  Patient now hypoxic requiring 3 to 4 L supplemental oxygen  Plan: COVID isolation precautions Continue Decadron  6 mg IV daily If patient removed remains on room air consider discontinuation of steroids on 1/1  UTI (urinary tract infection):  pt recently had VRE-UTI. He has been treated with cefuroxime  for Proteus mirabilis UTI recently.  Initial urinalysis done on 12/26 positive for leukocytes but negative for bacteria.  Patient still demonstrating signs and symptoms that could be consistent with possible infectious delirium to repeat urinalysis was performed on 12/28.  Interestingly this shows evidence of bacteria although rare.  Urine culture now growing Morganella Morgagni.  Sensitive to flora quinolones.  Not sensitive to ampicillin . Plan: Continue IV ciprofloxacin  per sensitivities If patient continues to improve can likely transition to oral antibiotics on 1/1 Monitor vitals and fever curve   History of CVA (cerebrovascular accident) -Crestor  and Plavix  -Imaging survey negative for acute CVA   Acute metabolic encephalopathy: Likely multifactorial in nature given new COVID diagnosis, urinary tract infection, advanced age, underlying  dementia.  MRI negative for acute CVA.  Mental status has deteriorated since 12/28.  Suspect also component of hospital-acquired delirium at this point.  Fortunately gas is  reassuring and does not demonstrate significant hypercarbia or hypoxia Plan: Mental status improved.  Approaching baseline.  Alert and oriented x 4.  Not endorsing fatigue.    Hyperlipidemia associated with type 2 diabetes mellitus (HCC) -Crestor    HTN (hypertension) -IV hydralazine  as needed   Type II diabetes mellitus with renal manifestations [facility]): Recent A1c 6.7, well-controlled.  Patient taking NovoLog , semaglutide , Tresiba .  Home dose of long-acting insulin  is 80 units daily.  Oral intake has been somewhat variable Plan: Decrease Semglee  to 20 units daily Sensitive sliding scale Avoid hypoglycemia   Chronic diastolic CHF (congestive heart failure) (HCC): 2D echo on 01/02/2022 showed EF of 55-60%.  BNP normal 29.5.  CHF seem to be compensated. -Watch volume status closely.  No IV fluids.   Chronic kidney disease, stage 3b [facility]): Renal function stable.  Recent baseline creatinine 1.4-1.9.     Hypothyroidism -Synthroid    Restless leg syndrome, severe -Pramipexole    Right leg swelling: Patient has asymmetric right leg edema. -Lower extremity venous Doppler negative for VTE   BPH with obstruction/lower urinary tract symptoms -Flomax    OSA - on CPAP nightly   Obesity (BMI 30-39.9): Body weight 83.5 kg, BMI 30.62 Complicates overall care and prognosis     DVT prophylaxis: Lovenox  Code Status: Full Family Communication: Daughter James Bell [phone] on 12/27, 12/28, 12/29, 12/30, 12/31 Disposition Plan: Status is: Inpatient Remains inpatient appropriate because: Acute metabolic encephalopathy, COVID-19 infection.   Level of care: Progressive  Consultants:  None  Procedures:  None  Antimicrobials: Unasyn    Subjective: Seen and examined.  Sitting up in chair.  Appears fatigued but alert and oriented x 4.  Objective: Vitals:   09/17/23 2314 09/18/23 0418 09/18/23 0810 09/18/23 1223  BP: 135/61 131/72 137/66 122/61  Pulse: 68 61 65 67  Resp: 20 18 16 16    Temp: 98.5 F (36.9 C) 98.1 F (36.7 C) 97.6 F (36.4 C) 97.8 F (36.6 C)  TempSrc:  Oral Oral Oral  SpO2: 99% 99% 96% 100%  Weight:      Height:        Intake/Output Summary (Last 24 hours) at 09/18/2023 1447 Last data filed at 09/18/2023 0816 Gross per 24 hour  Intake 372.08 ml  Output 950 ml  Net -577.92 ml   Filed Weights   09/14/23 1200 09/15/23 0500  Weight: 90 kg 89.6 kg    Examination:  General exam: Appears fatigued otherwise no distress Respiratory system: Bibasilar crackles.  Normal work of breathing.  Room air cardiovascular system: S1-S2, RRR, no murmurs, no pedal edema Gastrointestinal system: Soft, NT/ND, normal bowel sounds Central nervous system: Alert.  Oriented x 4 Extremities: Power 5X5.  Gait not assessed. Skin: No rashes, lesions or ulcers Psychiatry: Mood and affect intact    Data Reviewed: I have personally reviewed following labs and imaging studies  CBC: Recent Labs  Lab 09/13/23 1809 09/14/23 0639 09/16/23 0843 09/17/23 0831  WBC 9.5 7.2 8.7 5.7  NEUTROABS 5.7  --  6.4 3.8  HGB 12.9* 12.6* 13.4 12.2*  HCT 39.5 38.9* 40.2 35.7*  MCV 92.9 92.4 90.7 87.1  PLT 215 169 174 146*   Basic Metabolic Panel: Recent Labs  Lab 09/13/23 1809 09/14/23 0639 09/16/23 0843 09/17/23 0831  NA 138 138 136 135  K 3.4* 3.4* 4.0 3.6  CL 106 108 101 101  CO2 22 23 25 23   GLUCOSE 95 110* 81 80  BUN 27* 22 21 22   CREATININE 1.65* 1.39* 1.66* 1.59*  CALCIUM  8.9 8.2* 8.4* 7.5*  MG 2.1  --   --   --    GFR: Estimated Creatinine Clearance: 33.6 mL/min (A) (by C-G formula based on SCr of 1.59 mg/dL (H)). Liver Function Tests: Recent Labs  Lab 09/13/23 1809  AST 21  ALT 16  ALKPHOS 44  BILITOT 0.5  PROT 7.5  ALBUMIN 3.4*   No results for input(s): LIPASE, AMYLASE in the last 168 hours. No results for input(s): AMMONIA in the last 168 hours. Coagulation Profile: Recent Labs  Lab 09/13/23 1809  INR 1.2   Cardiac Enzymes: No  results for input(s): CKTOTAL, CKMB, CKMBINDEX, TROPONINI in the last 168 hours. BNP (last 3 results) Recent Labs    06/22/23 1320  PROBNP 33.0   HbA1C: No results for input(s): HGBA1C in the last 72 hours. CBG: Recent Labs  Lab 09/17/23 1216 09/17/23 1639 09/17/23 2107 09/18/23 0811 09/18/23 1225  GLUCAP 136* 119* 113* 155* 194*   Lipid Profile: No results for input(s): CHOL, HDL, LDLCALC, TRIG, CHOLHDL, LDLDIRECT in the last 72 hours. Thyroid  Function Tests: No results for input(s): TSH, T4TOTAL, FREET4, T3FREE, THYROIDAB in the last 72 hours. Anemia Panel: No results for input(s): VITAMINB12, FOLATE, FERRITIN, TIBC, IRON, RETICCTPCT in the last 72 hours. Sepsis Labs: Recent Labs  Lab 09/14/23 9360  PROCALCITON <0.10    Recent Results (from the past 240 hours)  Urine Culture     Status: Abnormal   Collection Time: 09/13/23 11:22 PM   Specimen: Urine, Random  Result Value Ref Range Status   Specimen Description   Final    URINE, RANDOM Performed at [facility], [address], [place], [place] [address]    Special Requests   Final    NONE Reflexed from [id number] Performed at [facility], [address] [place], [place] [address]    Culture (A)  Final    50,000 COLONIES/mL MORGANELLA MORGANII 30,000 COLONIES/mL PROTEUS MIRABILIS    Report Status 09/16/2023 FINAL  Final   Organism ID, Bacteria MORGANELLA MORGANII (A)  Final   Organism ID, Bacteria PROTEUS MIRABILIS (A)  Final      Susceptibility   Morganella morganii - MIC*    AMPICILLIN  >=32 RESISTANT Resistant     CIPROFLOXACIN  <=0.25 SENSITIVE Sensitive     GENTAMICIN <=1 SENSITIVE Sensitive     IMIPENEM 4 SENSITIVE Sensitive     NITROFURANTOIN 128 RESISTANT Resistant     TRIMETH /SULFA  <=20 SENSITIVE Sensitive     AMPICILLIN /SULBACTAM 16 INTERMEDIATE Intermediate     PIP/TAZO <=4 SENSITIVE Sensitive ug/mL    * 50,000 COLONIES/mL MORGANELLA  MORGANII   Proteus mirabilis - MIC*    AMPICILLIN  <=2 SENSITIVE Sensitive     CEFAZOLIN  8 SENSITIVE Sensitive     CEFEPIME  <=0.12 SENSITIVE Sensitive     CEFTRIAXONE  <=0.25 SENSITIVE Sensitive     CIPROFLOXACIN  <=0.25 SENSITIVE Sensitive     GENTAMICIN <=1 SENSITIVE Sensitive     IMIPENEM 4 SENSITIVE Sensitive     NITROFURANTOIN 128 RESISTANT Resistant     TRIMETH /SULFA  <=20 SENSITIVE Sensitive     AMPICILLIN /SULBACTAM <=2 SENSITIVE Sensitive     PIP/TAZO <=4 SENSITIVE Sensitive ug/mL    * 30,000 COLONIES/mL PROTEUS MIRABILIS  Urine Culture (for pregnant, neutropenic or urologic patients or patients with an indwelling urinary catheter)     Status: None   Collection Time:  09/15/23  2:14 PM   Specimen: Urine, Random  Result Value Ref Range Status   Specimen Description   Final    URINE, RANDOM Performed at [facility], [address] [place], [place] [address]    Special Requests   Final    NONE Performed at [facility], [address], [place], [place] [address]    Culture   Final    NO GROWTH Performed at [facility], [address] [place], [place] [address]    Report Status 09/17/2023 FINAL  Final  Respiratory (~20 pathogens) panel by PCR     Status: None   Collection Time: 09/15/23  3:27 PM   Specimen: Nasopharyngeal Swab; Respiratory  Result Value Ref Range Status   Adenovirus NOT DETECTED NOT DETECTED Final   Coronavirus 229E NOT DETECTED NOT DETECTED Final    Comment: (NOTE) The Coronavirus on the Respiratory Panel, DOES NOT test for the novel  Coronavirus (2019 nCoV)    Coronavirus HKU1 NOT DETECTED NOT DETECTED Final   Coronavirus NL63 NOT DETECTED NOT DETECTED Final   Coronavirus OC43 NOT DETECTED NOT DETECTED Final   Metapneumovirus NOT DETECTED NOT DETECTED Final   Rhinovirus / Enterovirus NOT DETECTED NOT DETECTED Final   Influenza A NOT DETECTED NOT DETECTED Final   Influenza B NOT DETECTED NOT DETECTED Final   Parainfluenza  Virus 1 NOT DETECTED NOT DETECTED Final   Parainfluenza Virus 2 NOT DETECTED NOT DETECTED Final   Parainfluenza Virus 3 NOT DETECTED NOT DETECTED Final   Parainfluenza Virus 4 NOT DETECTED NOT DETECTED Final   Respiratory Syncytial Virus NOT DETECTED NOT DETECTED Final   Bordetella pertussis NOT DETECTED NOT DETECTED Final   Bordetella Parapertussis NOT DETECTED NOT DETECTED Final   Chlamydophila pneumoniae NOT DETECTED NOT DETECTED Final   Mycoplasma pneumoniae NOT DETECTED NOT DETECTED Final    Comment: Performed at [facility], [address], [place], [place] [address]  SARS Coronavirus 2 by RT PCR (hospital order, performed in [facility] hospital lab) *cepheid single result test* Anterior Nasal Swab     Status: Abnormal   Collection Time: 09/15/23  3:27 PM   Specimen: Anterior Nasal Swab  Result Value Ref Range Status   SARS Coronavirus 2 by RT PCR POSITIVE (A) NEGATIVE Final    Comment: (NOTE) SARS-CoV-2 target nucleic acids are DETECTED  SARS-CoV-2 RNA is generally detectable in upper respiratory specimens  during the acute phase of infection.  Positive results are indicative  of the presence of the identified virus, but do not rule out bacterial infection or co-infection with other pathogens not detected by the test.  Clinical correlation with patient history and  other diagnostic information is necessary to determine patient infection status.  The expected result is negative.  Fact Sheet for Patients:   roadlaptop.co.za   Fact Sheet for Healthcare Providers:   http://kim-miller.com/    This test is not yet approved or cleared by the [place]  FDA and  has been authorized for detection and/or diagnosis of SARS-CoV-2 by FDA under an Emergency Use Authorization (EUA).  This EUA will remain in effect (meaning this test can be used) for the duration of  the COVID-19 declaration under Section 564(b)(1)  of the Act,  21 U.S.C. section 360-bbb-3(b)(1), unless the authorization is terminated or revoked sooner.   Performed at [facility], [address], [place], [place] [address]          Radiology Studies: No results found.  Scheduled Meds:  brimonidine   1 drop Both Eyes BID   And   timolol   1 drop Both Eyes BID   clopidogrel   75 mg Oral Daily   dexamethasone  (DECADRON ) injection  6 mg Intravenous Q24H   DULoxetine   60 mg Oral QHS   enoxaparin  (LOVENOX ) injection  40 mg Subcutaneous Q24H   famotidine   20 mg Oral QHS   insulin  aspart  0-5 Units Subcutaneous QHS   insulin  aspart  0-9 Units Subcutaneous TID WC   insulin  glargine-yfgn  20 Units Subcutaneous Daily   levothyroxine   50 mcg Oral Q0600   pantoprazole   40 mg Oral Daily   pramipexole   1 mg Oral QHS   rosuvastatin   40 mg Oral QHS   saccharomyces boulardii  500 mg Oral BID   sodium chloride  flush  3 mL Intravenous Once   tamsulosin   0.4 mg Oral QPC supper   Continuous Infusions:  ciprofloxacin  400 mg (09/18/23 1401)     LOS: 2 days    James KATHEE Robson, MD [facility]    If 7PM-7AM, please contact night-coverage  09/18/2023, 2:47 PM

## 2023-09-18 NOTE — Progress Notes (Signed)
Patient refused BIPAP around midnight, education provided, I put him on 4 L [place], and he is doing well and said he feel comfortable, CN notified, will continue to monitor

## 2023-09-18 NOTE — Progress Notes (Signed)
 Physical Therapy Treatment Patient Details Name: James CORNIEL, MD MRN: [id number] DOB: August 08, 1931 Today's Date: 09/18/2023   History of Present Illness Pt is a 89 year old male presenting to ED with weakness, difficulty speaking, confusion; work up includes UTI, acute metabolic encephalopathy     PMH significant for HLD, DM, dCHF, stroke,/TIA, hypothyroidism, memory loss, dementia, BPH, CKD-3B, obesity, OSA on CPAP, RLS, migraine headache, C. difficile, recent admission due to VRE-UTI    PT Comments  Pt seen for PT tx with pt agreeable. Pt on room air with SPO2 >90% throughout session. Session focused on transfers with pt requiring cuing to scoot out to edge of seat, mod assist for STS & step pivot bed<>recliner. Pt attempts to use RUE to push to standing but has decreased strength 2/2 hx of hemiparesis. Will continue to follow pt acutely to progress strength & independence with transfers.    If plan is discharge home, recommend the following: A lot of help with walking and/or transfers;Assist for transportation;A lot of help with bathing/dressing/bathroom   Can travel by private vehicle        Equipment Recommendations  None recommended by PT    Recommendations for Other Services       Precautions / Restrictions Precautions Precautions: Fall Restrictions Weight Bearing Restrictions Per Provider Order: No     Mobility  Bed Mobility               General bed mobility comments: not tested, pt received & left sitting in recliner    Transfers Overall transfer level: Needs assistance Equipment used: None, 1 person hand held assist Transfers: Sit to/from Stand, Bed to chair/wheelchair/BSC Sit to Stand: Mod assist     Squat pivot transfers: Mod assist     General transfer comment: Pt performed 3x STS from recliner & 1x from EOB. PT provides education re: lateral leans for increased ease of scooting to edge of seat. Pt transfers STS with mod assist, stand pivot with  mod assist with extra time to manuever RLE for small steps.    Ambulation/Gait                   Stairs             Wheelchair Mobility     Tilt Bed    Modified Rankin (Stroke Patients Only)       Balance Overall balance assessment: Needs assistance Sitting-balance support: Bilateral upper extremity supported Sitting balance-Leahy Scale: Good     Standing balance support: During functional activity, Single extremity supported Standing balance-Leahy Scale: Poor                              Cognition Arousal: Alert Behavior During Therapy: WFL for tasks assessed/performed Overall Cognitive Status: Within Functional Limits for tasks assessed                                 General Comments: Pleasant, willing to participate, follows commands throughout session.        Exercises      General Comments General comments (skin integrity, edema, etc.): Pt on room air, SPO2 >90% throughout.      Pertinent Vitals/Pain Pain Assessment Pain Assessment: No/denies pain    Home Living  Prior Function            PT Goals (current goals can now be found in the care plan section) Acute Rehab PT Goals Patient Stated Goal: get better, go home PT Goal Formulation: With patient Time For Goal Achievement: 09/29/23 Potential to Achieve Goals: Fair Progress towards PT goals: Progressing toward goals    Frequency    Min 1X/week      PT Plan      Co-evaluation              AM-PAC PT 6 Clicks Mobility   Outcome Measure  Help needed turning from your back to your side while in a flat bed without using bedrails?: A Little Help needed moving from lying on your back to sitting on the side of a flat bed without using bedrails?: A Little Help needed moving to and from a bed to a chair (including a wheelchair)?: A Lot Help needed standing up from a chair using your arms (e.g., wheelchair or  bedside chair)?: A Lot Help needed to walk in hospital room?: Total Help needed climbing 3-5 steps with a railing? : Total 6 Click Score: 12    End of Session   Activity Tolerance: Patient tolerated treatment well Patient left: in chair;with call bell/phone within reach   PT Visit Diagnosis: Other abnormalities of gait and mobility (R26.89);History of falling (Z91.81);Muscle weakness (generalized) (M62.81);Unsteadiness on feet (R26.81)     Time: 8975-8956 PT Time Calculation (min) (ACUTE ONLY): 19 min  Charges:    $Therapeutic Activity: 8-22 mins PT General Charges $$ ACUTE PT VISIT: 1 Visit                     Richerd Pinal, PT, DPT 09/18/23, 10:55 AM   Richerd CHRISTELLA Pinal 09/18/2023, 10:54 AM

## 2023-09-19 DIAGNOSIS — I5032 Chronic diastolic (congestive) heart failure: Secondary | ICD-10-CM | POA: Diagnosis not present

## 2023-09-19 DIAGNOSIS — R531 Weakness: Secondary | ICD-10-CM | POA: Diagnosis not present

## 2023-09-19 DIAGNOSIS — Z515 Encounter for palliative care: Secondary | ICD-10-CM | POA: Diagnosis not present

## 2023-09-19 DIAGNOSIS — U071 COVID-19: Secondary | ICD-10-CM | POA: Diagnosis not present

## 2023-09-19 LAB — CBC WITH DIFFERENTIAL/PLATELET
Abs Immature Granulocytes: 0.03 10*3/uL (ref 0.00–0.07)
Basophils Absolute: 0 10*3/uL (ref 0.0–0.1)
Basophils Relative: 0 %
Eosinophils Absolute: 0 10*3/uL (ref 0.0–0.5)
Eosinophils Relative: 0 %
HCT: 37.1 % — ABNORMAL LOW (ref 39.0–52.0)
Hemoglobin: 12.9 g/dL — ABNORMAL LOW (ref 13.0–17.0)
Immature Granulocytes: 0 %
Lymphocytes Relative: 19 %
Lymphs Abs: 1.3 10*3/uL (ref 0.7–4.0)
MCH: 30.2 pg (ref 26.0–34.0)
MCHC: 34.8 g/dL (ref 30.0–36.0)
MCV: 86.9 fL (ref 80.0–100.0)
Monocytes Absolute: 0.5 10*3/uL (ref 0.1–1.0)
Monocytes Relative: 7 %
Neutro Abs: 5.3 10*3/uL (ref 1.7–7.7)
Neutrophils Relative %: 74 %
Platelets: 191 10*3/uL (ref 150–400)
RBC: 4.27 MIL/uL (ref 4.22–5.81)
RDW: 12.9 % (ref 11.5–15.5)
WBC: 7.2 10*3/uL (ref 4.0–10.5)
nRBC: 0 % (ref 0.0–0.2)

## 2023-09-19 LAB — BASIC METABOLIC PANEL
Anion gap: 8 (ref 5–15)
BUN: 38 mg/dL — ABNORMAL HIGH (ref 8–23)
CO2: 21 mmol/L — ABNORMAL LOW (ref 22–32)
Calcium: 7.9 mg/dL — ABNORMAL LOW (ref 8.9–10.3)
Chloride: 103 mmol/L (ref 98–111)
Creatinine, Ser: 1.66 mg/dL — ABNORMAL HIGH (ref 0.61–1.24)
GFR, Estimated: 39 mL/min — ABNORMAL LOW (ref >60)
Glucose, Bld: 169 mg/dL — ABNORMAL HIGH (ref 70–99)
Potassium: 3.9 mmol/L (ref 3.5–5.1)
Sodium: 132 mmol/L — ABNORMAL LOW (ref 135–145)

## 2023-09-19 LAB — GLUCOSE, CAPILLARY
Glucose-Capillary: 190 mg/dL — ABNORMAL HIGH (ref 70–99)
Glucose-Capillary: 111 mg/dL — ABNORMAL HIGH (ref 70–99)
Glucose-Capillary: 124 mg/dL — ABNORMAL HIGH (ref 70–99)
Glucose-Capillary: 114 mg/dL — ABNORMAL HIGH (ref 70–99)
Glucose-Capillary: 125 mg/dL — ABNORMAL HIGH (ref 70–99)

## 2023-09-19 MED ORDER — DEXAMETHASONE 4 MG PO TABS: 4.0000 mg | ORAL_TABLET | Freq: Two times a day (BID) | ORAL | Status: DC

## 2023-09-19 MED ORDER — DEXAMETHASONE 4 MG PO TABS
4.0000 mg | ORAL_TABLET | Freq: Two times a day (BID) | ORAL | Status: AC
  Administered 2023-09-19 – 2023-09-20 (×3): 4 mg via ORAL
  Filled 2023-09-19 (×3): qty 1

## 2023-09-19 MED ORDER — CIPROFLOXACIN HCL 500 MG PO TABS
500.0000 mg | ORAL_TABLET | Freq: Two times a day (BID) | ORAL | Status: AC
  Administered 2023-09-19 – 2023-09-22 (×7): 500 mg via ORAL
  Filled 2023-09-19 (×8): qty 1

## 2023-09-19 NOTE — [place] FL2 (Signed)
 [place]  MEDICAID [id number] LEVEL OF CARE FORM     IDENTIFICATION  Patient Name: James GORMAN Fearing, MD Birthdate: Aug 20, 1931 Sex: male Admission Date (Current Location): 09/13/2023  [place] and [place] Number:  Chiropodist and Address:  [facility], [address], [place], [place] [address]      Provider Number: [id number]  Attending Physician Name and Address:  Barbarann Nest, MD  Relative Name and Phone Number:       Current Level of Care: Hospital Recommended Level of Care: Skilled Nursing Facility Prior Approval Number:    Date Approved/Denied:   PASRR Number: [id number]  Discharge Plan: SNF    Current Diagnoses: Patient Active Problem List   Diagnosis Date Noted   COVID-19 09/16/2023   Acute metabolic encephalopathy 09/14/2023   Speaking difficulty 09/13/2023   Type II diabetes mellitus with renal manifestations (HCC) 09/13/2023   Chronic diastolic CHF (congestive heart failure) (HCC) 09/13/2023   Obesity (BMI 30-39.9) 09/13/2023   Chronic kidney disease, stage 3b (HCC) 09/13/2023   Hypocalcemia 08/15/2023   Urinary frequency 08/15/2023   VRE (vancomycin -resistant Enterococci) infection 07/02/2023   Pedal edema 06/22/2023   Abnormal chest CT 05/25/2023   Diplopia 05/25/2023   History of partial ray amputation of second toe of left foot (HCC) 03/07/2023   Closed right ankle fracture 03/07/2023   White patches on oral mucosa 03/07/2023   Memory deficit 07/31/2022   Diabetic ulcer of toe associated with type 2 diabetes mellitus, limited to breakdown of skin (HCC) 04/24/2022   Bradycardia    Insomnia 01/01/2022   Hypoglycemia 01/01/2022   Aspiration pneumonia (HCC) 11/23/2021   Hypomagnesemia 11/23/2021   History of Clostridium difficile colitis    Postural dizziness with presyncope 10/25/2021   General weakness 05/12/2021   Polypharmacy 03/10/2021   Acute encephalopathy 11/02/2020   UTI (urinary tract infection)  10/18/2020   Nausea vomiting and diarrhea 10/18/2020   Involuntary movements 04/25/2020   Recurrent productive cough 05/31/2019   Hypothyroidism 03/11/2019   Hx of basal cell carcinoma 07/10/2017   Rosacea    Gait disturbance, post-stroke 10/05/2015   Presbycusis of both ears    Impaired mobility 04/23/2015   Hearing loss of right ear due to cerumen impaction 03/23/2015   HTN (hypertension) 03/18/2015   Diastolic dysfunction 03/18/2015   Spastic hemiparesis affecting dominant side (HCC) 07/07/2014   Right leg swelling 06/09/2014   Medicare annual wellness visit, subsequent 11/06/2013   Erectile dysfunction due to arterial insufficiency 02/05/2013   MDD (major depressive disorder), recurrent episode, moderate (HCC)    Palpitations 06/26/2012   Hyperlipidemia associated with type 2 diabetes mellitus (HCC)    Restless leg syndrome, severe    GERD (gastroesophageal reflux disease)    BPH with obstruction/lower urinary tract symptoms    History of CVA (cerebrovascular accident) 03/11/2012   Hemiparesis affecting right side as late effect of cerebrovascular accident (CVA) (HCC) 03/11/2012   Type 2 diabetes mellitus with hyperglycemia (HCC) 03/11/2012   OSA on CPAP 03/11/2012   CKD stage 3 due to type 2 diabetes mellitus (HCC) 03/11/2012   Migraines 03/11/2012   Thrombocytopenia (HCC) 2011   Fall with injury 2011    Orientation RESPIRATION BLADDER Height & Weight     Self, Place  Normal, Other (Comment) (Bipap QHS: 12/6 at 30%) Incontinent, External catheter Weight: 197 lb 8.5 oz (89.6 kg) Height:  5' 8 (172.7 cm)  BEHAVIORAL SYMPTOMS/MOOD NEUROLOGICAL BOWEL NUTRITION STATUS     (History of CVA) Incontinent Diet (DYS  2.)  AMBULATORY STATUS COMMUNICATION OF NEEDS Skin   Extensive Assist Verbally Other (Comment) (Erythema/redness. Diabetic ulcer on left big toe (no dressing). Non-pressure wound on right 5th toe (no dressing). MASD on pelvis (no dressing).)                        Personal Care Assistance Level of Assistance  Bathing, Feeding, Dressing Bathing Assistance: Maximum assistance Feeding assistance: Limited assistance Dressing Assistance: Maximum assistance     Functional Limitations Info  Sight, Hearing, Speech Sight Info: Adequate Hearing Info: Adequate Speech Info: Adequate    SPECIAL CARE FACTORS FREQUENCY  PT (By licensed PT), OT (By licensed OT)     PT Frequency: 5 x week OT Frequency: 5 x week            Contractures Contractures Info: Not present    Additional Factors Info  Isolation Precautions Code Status Info: DNR Allergies Info: Dulaglutide, Empagliflozin, Lisinopril , Nexium  (Esomeprazole ), Oxycodone , Pregabalin, Rosuvastatin      Isolation Precautions Info: Airborne/Contact: Tested positive for COVID on 12/28. VRE, MRSA.     Current Medications (09/19/2023):  This is the current hospital active medication list Current Facility-Administered Medications  Medication Dose Route Frequency Provider Last Rate Last Admin   acetaminophen  (TYLENOL ) suppository 650 mg  650 mg Rectal Q6H PRN Niu, Xilin, MD       brimonidine  (ALPHAGAN ) 0.2 % ophthalmic solution 1 drop  1 drop Both Eyes BID Legrand Olam MATSU, RPH   1 drop at 09/19/23 9153   And   timolol  (TIMOPTIC ) 0.5 % ophthalmic solution 1 drop  1 drop Both Eyes BID Legrand Olam MATSU, RPH   1 drop at 09/19/23 9153   ciprofloxacin  (CIPRO ) IVPB 400 mg  400 mg Intravenous Q12H Jhonny Sahara B, MD 200 mL/hr at 09/19/23 0023 400 mg at 09/19/23 0023   clopidogrel  (PLAVIX ) tablet 75 mg  75 mg Oral Daily Niu, Xilin, MD   75 mg at 09/19/23 0846   dexamethasone  (DECADRON ) injection 6 mg  6 mg Intravenous Q24H Jhonny Sahara B, MD   6 mg at 09/18/23 2149   dextrose  50 % solution 50 mL  50 mL Intravenous PRN Niu, Xilin, MD   50 mL at 09/16/23 1135   DULoxetine  (CYMBALTA ) DR capsule 60 mg  60 mg Oral QHS Niu, Xilin, MD   60 mg at 09/18/23 2148   enoxaparin  (LOVENOX ) injection 40 mg  40 mg  Subcutaneous Q24H Belue, Nathan S, RPH   40 mg at 09/19/23 9152   famotidine  (PEPCID ) tablet 20 mg  20 mg Oral QHS Niu, Xilin, MD   20 mg at 09/18/23 2148   hydrALAZINE  (APRESOLINE ) injection 5 mg  5 mg Intravenous Q2H PRN Niu, Xilin, MD       insulin  aspart (novoLOG ) injection 0-5 Units  0-5 Units Subcutaneous QHS Niu, Xilin, MD   2 Units at 09/15/23 2236   insulin  aspart (novoLOG ) injection 0-9 Units  0-9 Units Subcutaneous TID WC Niu, Xilin, MD   2 Units at 09/19/23 9146   insulin  glargine-yfgn (SEMGLEE ) injection 20 Units  20 Units Subcutaneous Daily Sreenath, Sudheer B, MD   20 Units at 09/19/23 0846   levothyroxine  (SYNTHROID ) tablet 50 mcg  50 mcg Oral Q0600 Niu, Xilin, MD   50 mcg at 09/19/23 0604   ondansetron  (ZOFRAN ) injection 4 mg  4 mg Intravenous Q8H PRN Niu, Xilin, MD   4 mg at 09/19/23 0947   pantoprazole  (PROTONIX ) EC tablet 40 mg  40 mg Oral Daily Niu, Xilin, MD   40 mg at 09/19/23 0846   polyvinyl alcohol  (LIQUIFILM TEARS) 1.4 % ophthalmic solution 2 drop  2 drop Right Eye PRN Sreenath, Sudheer B, MD       pramipexole  (MIRAPEX ) tablet 1 mg  1 mg Oral QHS Niu, Xilin, MD   1 mg at 09/18/23 2148   rosuvastatin  (CRESTOR ) tablet 40 mg  40 mg Oral QHS Niu, Xilin, MD   40 mg at 09/18/23 2149   saccharomyces boulardii (FLORASTOR) capsule 500 mg  500 mg Oral BID Niu, Xilin, MD   500 mg at 09/19/23 9153   senna-docusate (Senokot-S) tablet 1 tablet  1 tablet Oral Daily PRN Niu, Xilin, MD   1 tablet at 09/15/23 1513   sodium chloride  flush (NS) 0.9 % injection 3 mL  3 mL Intravenous Once Levander Slate, MD       tamsulosin  (FLOMAX ) capsule 0.4 mg  0.4 mg Oral QPC supper Niu, Xilin, MD   0.4 mg at 09/18/23 8175     Discharge Medications: Please see discharge summary for a list of discharge medications.  Relevant Imaging Results:  Relevant Lab Results:   Additional Information SS#: [id number]  Lauraine JAYSON Carpen, LCSW

## 2023-09-19 NOTE — Progress Notes (Signed)
 Palliative Care Progress Note, Assessment & Plan   Patient Name: James GORMAN Fearing, MD       Date: 09/19/2023 DOB: 11/02/1930  Age: 89 y.o. MRN#: [id number] Attending Physician: Barbarann Nest, MD Primary Care Physician: Rilla Baller, MD Admit Date: 09/13/2023  Subjective: Patient is out of bed and sitting in recliner.  He is awake and alert.  He acknowledges my presence and is able to make his wishes known.  No family or friends present during my visit.  HPI: 89 y.o. male  with past medical history of HLD, DM, dCHF, stroke,/TIA, hypothyroidism, memory loss, dementia, BPH, CKD-3B, obesity, OSA on CPAP, RLS, migraine headache, C. difficile, recent admission due to VRE-UTI  admitted on 09/13/2023 with weakness, difficulty speaking, and confusion.    Patient found to have positive urinalysis and positive urine culture for Proteus Miribilis.   Due to concern for new stroke, ED physician discussed with neurologist Dr. Audrie recommended CT angio of head and neck and MRI brain.  CTA of head and neck as well as MRI of brain are negative/WNL.   Patient found to be COVID-positive.  Patient is being treated for COVID and UTI.   PMT was consulted to discuss GOC.   Summary of counseling/coordination of care: Extensive chart review completed prior to meeting patient including labs, vital signs, imaging, progress notes, orders, and available advanced directive documents from current and previous encounters.   After reviewing the patient's chart and assessing the patient at bedside, spoke with patient in regards to symptom management and goals of care.  Symptoms assessed.  Patient Dors is his urinary frequency has improved.  He shares that the catheter is working well to keep him dry.  He has no other  acute complaints at this time.  No adjustment to [facility] needed.  I attempted elicit values and goals imported the patient.  He shares he is ready to do what he needs to do to be able to get out of the hospital and return home.  We discussed the need for PT and OT to continue to work with him to determine his mobility needs.  He shares he is happy to be feeling more like himself and ready and willing to do what is needed.  We discussed need for COVID isolation and that this may help shape his discharge plan.  He understands that he needs to be in isolation so that he does not pass the virus along to others.  He is content where he is right now.  He is remarkably calm and not as worked up and worried that I knew him to be yesterday.  Discussed that goals are clear.  TOC following closely for discharge planning.  Patient/family (daughter Reena) have PMT contact information and were encouraged to contact PMT with any future acute palliative needs.   Will have PMT follow-up later this week to assess for ongoing needs and support.  Physical Exam Vitals reviewed.  Constitutional:      General: He is not in acute distress.    Appearance: He is normal weight.  HENT:     Head: Normocephalic.     Mouth/Throat:     Mouth: Mucous membranes are moist.  Eyes:  Pupils: Pupils are equal, round, and reactive to light.  Cardiovascular:     Pulses: Normal pulses.  Pulmonary:     Effort: Pulmonary effort is normal.  Abdominal:     Palpations: Abdomen is soft.  Neurological:     Mental Status: He is alert and oriented to person, place, and time.  Psychiatric:        Mood and Affect: Mood normal.        Behavior: Behavior normal.        Thought Content: Thought content normal.        Judgment: Judgment normal.             Total Time 25 minutes   Time spent includes: Detailed review of medical records (labs, imaging, vital signs), medically appropriate exam (mental status, respiratory, cardiac,  skin), discussed with treatment team, counseling and educating patient, family and staff, documenting clinical information, medication management and coordination of care.  [name], DNP, FNP-BC Palliative Medicine Team

## 2023-09-19 NOTE — Plan of Care (Signed)
  Problem: Coping: Goal: Ability to adjust to condition or change in health will improve Outcome: Progressing   Problem: Fluid Volume: Goal: Ability to maintain a balanced intake and output will improve Outcome: Progressing   Problem: Health Behavior/Discharge Planning: Goal: Ability to identify and utilize available resources and services will improve Outcome: Progressing Goal: Ability to manage health-related needs will improve Outcome: Progressing

## 2023-09-19 NOTE — TOC Progression Note (Signed)
 Transition of Care [facility]) - Progression Note    Patient Details  Name: James MCTAVISH, MD MRN: [id number] Date of Birth: 1931-04-13  Transition of Care [facility]) CM/SW Contact  Lauraine JAYSON Carpen, LCSW Phone Number: 09/19/2023, 12:30 PM  Clinical Narrative:   CSW called daughter. Discussed SNF vs home. She feels like patient could return home if he is able to stand and transfer without too much assistance. CSW sent secure chat to PT and OT to notify. His current personal care agency [facility]) cannot see him again until he tests negative for COVID. CSW called and spoke to Teresa with [facility]. She will give daughter the contact information for another agency that can see COVID positive patients. Daughter is agreeable to CSW sending out SNF referral in case he needs rehab before he returns home.  Expected Discharge Plan: Home w Home Health Services Barriers to Discharge: Continued Medical Work up  Expected Discharge Plan and Services     Post Acute Care Choice: Home Health Living arrangements for the past 2 months: Independent Living Facility                                       Social Determinants of Health (SDOH) Interventions SDOH Screenings   Food Insecurity: No Food Insecurity (09/14/2023)  Housing: Low Risk  (09/14/2023)  Transportation Needs: No Transportation Needs (09/14/2023)  Utilities: Not At Risk (09/14/2023)  Depression (PHQ2-9): Low Risk  (05/10/2023)  Financial Resource Strain: Low Risk  (12/30/2020)  Social Connections: Unknown (09/18/2023)  Tobacco Use: Low Risk  (09/14/2023)    Readmission Risk Interventions     No data to display

## 2023-09-19 NOTE — Care Management Important Message (Signed)
 Important Message  Patient Details  Name: KALADIN NOSEWORTHY, MD MRN: [id number] Date of Birth: 10-09-1930   Important Message Given:        Cristela Blue, CMA 09/19/2023, 10:46 AM

## 2023-09-19 NOTE — Hospital Course (Addendum)
 89yo with h/o HLD, DM, chronic diastolic CHF, CVA, hypothyroidism, dementia, BPH, stage 3b CKD, OSA on CPAP, and recent admission for VRE UTI who presented on 12/26 with lethargy.  Initial concern for CVA, negative MRI/CTA.  UA positive for Proteus mirabilis UTI on 12/20, started on cefuroxime  by urology.  He was found to have COVID with encephalopathy and acute hypoxic respiratory failure, treated with Decadron .    1/4: Vital stable.  Slight worsening of creatinine but remained within baseline. Patient with need to go to SNF, quarantine on 09/25/2023. Will complete antibiotics for UTI today. PT is recommending SNF but family is trying to arrange 24/7 care at his ILF as patient does not want to go to rehab place.  Having difficulty with his positive COVID status.  They are recommending negative COVID test, explained to daughter that it is not recommended to repeat for 79-month as COVID can remain positive for 17-month but patient will not be infectious.  1/5: Hypoglycemic this morning so holding somatically.  Hopefully can go tomorrow as daughter is trying to arrange home health with a different company and hoping they can start on Monday. Lost IV-difficult stick.  Currently keeping without IV for now  1/6: Hemodynamically stable.  CBG within goal so Semglee  dose was decreased to 10 units from 20.  Will complete the quarantine time tomorrow, daughter is requesting discharge early morning tomorrow.  1/7: Patient remained hemodynamically stable.  CBG now trending up. Patient was on Tresiba  90 units with SSI at home.  With SSI at home.  He was advised to decrease that to 20 units and monitor his blood glucose level carefully and uptitrate as needed.  Patient will continue with rest of his home medications and need to have a close follow-up with his primary care provider for further assistance.

## 2023-09-19 NOTE — Progress Notes (Signed)
 Progress Note   Patient: James SORBO, MD FMW:[id number] DOB: 1931/09/12 DOA: 09/13/2023     3 DOS: the patient was seen and examined on 09/19/2023   Brief hospital course: 89yo with h/o HLD, DM, chronic diastolic CHF, CVA, hypothyroidism, dementia, BPH, stage 3b CKD, OSA on CPAP, and recent admission for VRE UTI who presented on 12/26 with lethargy.  Initial concern for CVA, negative MRI/CTA.  UA positive for Proteus mirabilis UTI on 12/20, started on cefuroxime  by urology.  He was found to have COVID with encephalopathy and acute hypoxic respiratory failure, treated with Decadron .    Assessment and Plan:  COVID-19 infection  Reported to have acute respiratory failure but O2 sats are not documented <90% Daughter reported that the patient had several sick contacts during the holiday season COVID-19 positive Predominant COVID symptoms appear to have been encephalopathy and generalized weakness He is currently comfortable on RA COVID isolation  Continue steroids but transition from Decadron  6 mg IV daily to 4 mg BID x 5 total days (through 1/2)   UTI (urinary tract infection) Patient recently had VRE-UTI He has been treated with cefuroxime  for Proteus mirabilis UTI recently Initial urinalysis done on 12/26 positive for leukocytes but negative for bacteria  Urine culture now growing Morganella Morgagni Continue ciprofloxacin  per sensitivities but will transition to PO for a total of 7 days (through 1/4)   History of CVA (cerebrovascular accident) Continue Crestor  and Plavix  Imaging survey negative for acute CVA   Hyperlipidemia associated with type 2 diabetes mellitus (HCC) Continue Crestor    HTN (hypertension) -IV hydralazine  as needed   Type II diabetes mellitus with renal manifestations Recent A1c 6.7, well-controlled - his A1c goal is <8 Patient taking NovoLog , semaglutide , Tresiba  80 units daily Decrease Semglee  to 20 units daily Sensitive sliding scale Avoid  hypoglycemia Continue gabapentin    Chronic diastolic CHF (congestive heart failure)  2D echo on 01/02/2022 showed EF of 55-60% CHF seem to be compensated   Chronic kidney disease, stage 3b  Renal function stable Recent baseline creatinine 1.4-1.9   Hypothyroidism Continue Synthroid    Restless leg syndrome, severe Continue Pramipexole    Right leg swelling Lower extremity venous Doppler negative for VTE   BPH with obstruction/lower urinary tract symptoms Continue tamsulosin    OSA Continue CPAP nightly  Glaucoma Continue Combigan   Mood d/o Continue duloxetine    Obesity (BMI 30-39.9) Body mass index is 30.03 kg/m.SABRA  Weight loss should be encouraged Resume semaglutide  as an outpatient Complicates overall care and prognosis      Consultants: Palliative care PT OT TOC team  Procedures: None  Antibiotics: Unasyn  12/27-29 Ciprofloxacin  12/29-  30 Day Unplanned Readmission Risk Score    Flowsheet Row ED to Hosp-Admission (Current) from 09/13/2023 in [facility]U  30 Day Unplanned Readmission Risk Score (%) 25.39 Filed at 09/19/2023 0801       This score is the patient's risk of an unplanned readmission within 30 days of being discharged (0 -100%). The score is based on dignosis, age, lab data, medications, orders, and past utilization.   Low:  0-14.9   Medium: 15-21.9   High: 22-29.9   Extreme: 30 and above           Subjective: Doing better.  Still feeling somewhat weak but better.  He reports that he thinks he should go home on Saturday but acknowledges that this was just an arbitrary day.  Per his daughter, he lives in [facility] and is pretty ambulatory, mostly with  mechanized wheelchair.  He would either need to test negative for COVID or would need to go elsewhere because his facility will allow him to return but he is unable to get home health at the facility until he in negative.  He has had repeated falls, particularly  at night.  Rhinorrhea and cough started on 12/22, so he may be 10 days out now but family is not sure.   Objective: Vitals:   09/19/23 0751 09/19/23 1203  BP: (!) 122/52 (!) 108/52  Pulse: (!) 56 (!) 57  Resp: 18 14  Temp: 97.8 F (36.6 C) 97.9 F (36.6 C)  SpO2: 97% 99%    Intake/Output Summary (Last 24 hours) at 09/19/2023 1606 Last data filed at 09/19/2023 0754 Gross per 24 hour  Intake --  Output 1050 ml  Net -1050 ml   Filed Weights   09/14/23 1200 09/15/23 0500  Weight: 90 kg 89.6 kg    Exam:  General:  Appears calm and comfortable and is in NAD, on  RA Eyes:  normal lids, R conjunctival hemorrhage ENT:  grossly normal hearing, lips & tongue, mmm Neck:  no LAD, masses or thyromegaly Cardiovascular:  RRR, no m/r/g. No LE edema.  Respiratory:   CTA bilaterally with no wheezes/rales/rhonchi.  Normal respiratory effort. Abdomen:  soft, NT, ND Skin:  no rash or induration seen on limited exam Musculoskeletal:  grossly normal tone BUE/BLE, good ROM, no bony abnormality Psychiatric:  grossly normal mood and affect, speech fluent and appropriate, AOx3 Neurologic:  CN 2-12 grossly intact, moves all extremities in coordinated fashion  Data Reviewed: I have reviewed the patient's lab results since admission.  Pertinent labs for today include:   Na++ 132 Glucose 169 BUN 38/Creatinine 1.66/GFR 39 - stable WBC 7.2 Hgb 12.9     Family Communication: None present; I spoke with his daughter by telephone  Disposition: Status is: Inpatient Remains inpatient appropriate because: determining safe disposition     Time spent: 50 minutes  Unresulted Labs (From admission, onward)     Start     Ordered   09/20/23 0700  SARS Coronavirus 2 by RT PCR (hospital order, performed in [facility] hospital lab) *cepheid single result test* Anterior Nasal Swab  (Tier 2 - SARS Coronavirus 2 by RT PCR (hospital order, performed in [facility] hospital lab) *cepheid single result test*)   Once,   R        09/19/23 1605   09/20/23 0500  CBC with Differential/Platelet  Tomorrow morning,   R        09/19/23 1604   09/20/23 0500  Basic metabolic panel  Tomorrow morning,   R        09/19/23 1604             Author: Delon Herald, MD 09/19/2023 4:06 PM  For on call review [email or web address].

## 2023-09-20 DIAGNOSIS — U071 COVID-19: Secondary | ICD-10-CM | POA: Diagnosis not present

## 2023-09-20 LAB — CBC WITH DIFFERENTIAL/PLATELET
Abs Immature Granulocytes: 0.04 10*3/uL (ref 0.00–0.07)
Basophils Absolute: 0 10*3/uL (ref 0.0–0.1)
Basophils Relative: 0 %
Eosinophils Absolute: 0 10*3/uL (ref 0.0–0.5)
Eosinophils Relative: 0 %
HCT: 36.6 % — ABNORMAL LOW (ref 39.0–52.0)
Hemoglobin: 12.9 g/dL — ABNORMAL LOW (ref 13.0–17.0)
Immature Granulocytes: 1 %
Lymphocytes Relative: 18 %
Lymphs Abs: 1.5 10*3/uL (ref 0.7–4.0)
MCH: 30.9 pg (ref 26.0–34.0)
MCHC: 35.2 g/dL (ref 30.0–36.0)
MCV: 87.6 fL (ref 80.0–100.0)
Monocytes Absolute: 0.5 10*3/uL (ref 0.1–1.0)
Monocytes Relative: 6 %
Neutro Abs: 6.3 10*3/uL (ref 1.7–7.7)
Neutrophils Relative %: 75 %
Platelets: 222 10*3/uL (ref 150–400)
RBC: 4.18 MIL/uL — ABNORMAL LOW (ref 4.22–5.81)
RDW: 12.8 % (ref 11.5–15.5)
WBC: 8.3 10*3/uL (ref 4.0–10.5)
nRBC: 0 % (ref 0.0–0.2)

## 2023-09-20 LAB — GLUCOSE, CAPILLARY
Glucose-Capillary: 133 mg/dL — ABNORMAL HIGH (ref 70–99)
Glucose-Capillary: 147 mg/dL — ABNORMAL HIGH (ref 70–99)
Glucose-Capillary: 168 mg/dL — ABNORMAL HIGH (ref 70–99)
Glucose-Capillary: 238 mg/dL — ABNORMAL HIGH (ref 70–99)

## 2023-09-20 LAB — BASIC METABOLIC PANEL
Anion gap: 11 (ref 5–15)
BUN: 42 mg/dL — ABNORMAL HIGH (ref 8–23)
CO2: 21 mmol/L — ABNORMAL LOW (ref 22–32)
Calcium: 7.9 mg/dL — ABNORMAL LOW (ref 8.9–10.3)
Chloride: 100 mmol/L (ref 98–111)
Creatinine, Ser: 1.75 mg/dL — ABNORMAL HIGH (ref 0.61–1.24)
GFR, Estimated: 37 mL/min — ABNORMAL LOW (ref >60)
Glucose, Bld: 157 mg/dL — ABNORMAL HIGH (ref 70–99)
Potassium: 3.8 mmol/L (ref 3.5–5.1)
Sodium: 132 mmol/L — ABNORMAL LOW (ref 135–145)

## 2023-09-20 LAB — SARS CORONAVIRUS 2 BY RT PCR: SARS Coronavirus 2 by RT PCR: POSITIVE — AB

## 2023-09-20 NOTE — Progress Notes (Signed)
 1/2 Patient under Contact and Airborne Precautions. I spoke to patient's daughter Lurena Joiner) and received verbal consent and acknowledgement via telephone.

## 2023-09-20 NOTE — Progress Notes (Signed)
 Progress Note   Patient: James FONDA, MD FMW:[id number] DOB: Dec 02, 1930 DOA: 09/13/2023     4 DOS: the patient was seen and examined on 09/20/2023   Brief hospital course: 89yo with h/o HLD, DM, chronic diastolic CHF, CVA, hypothyroidism, dementia, BPH, stage 3b CKD, OSA on CPAP, and recent admission for VRE UTI who presented on 12/26 with lethargy.  Initial concern for CVA, negative MRI/CTA.  UA positive for Proteus mirabilis UTI on 12/20, started on cefuroxime  by urology.  He was found to have COVID with encephalopathy and acute hypoxic respiratory failure, treated with Decadron .    Assessment and Plan:  COVID-19 infection  Reported to have acute respiratory failure but O2 sats are not documented <90% Daughter reported that the patient had several sick contacts during the holiday season COVID-19 positive on admission and again on 1/2 Predominant COVID symptoms appear to have been encephalopathy and generalized weakness He is currently comfortable on RA COVID isolation  Continue steroids but transition from Decadron  6 mg IV daily to 4 mg BID x 5 total days (through 1/2) He will complete his 10-day quarantine on 1/7   UTI (urinary tract infection) Patient recently had VRE-UTI He has been treated with cefuroxime  for Proteus mirabilis UTI recently Initial urinalysis done on 12/26 positive for leukocytes but negative for bacteria  Urine culture now growing Morganella Morgagni Continue ciprofloxacin  per sensitivities but will transition to PO for a total of 7 days (through 1/4)   History of CVA (cerebrovascular accident) Continue Crestor  and Plavix  Imaging survey negative for acute CVA   Hyperlipidemia associated with type 2 diabetes mellitus (HCC) Continue Crestor    HTN (hypertension) IV hydralazine  as needed   Type II diabetes mellitus with renal manifestations Recent A1c 6.7, well-controlled - his A1c goal is <8 Patient taking NovoLog , semaglutide , Tresiba  80 units  daily Decrease Semglee  to 20 units daily Sensitive sliding scale Avoid hypoglycemia Continue gabapentin    Chronic diastolic CHF (congestive heart failure)  2D echo on 01/02/2022 showed EF of 55-60% CHF seem to be compensated   Chronic kidney disease, stage 3b  Renal function stable Recent baseline creatinine 1.4-1.9   Hypothyroidism Continue Synthroid    Restless leg syndrome, severe Continue Pramipexole    Right leg swelling Lower extremity venous Doppler negative for VTE   BPH with obstruction/lower urinary tract symptoms Continue tamsulosin    OSA Continue CPAP nightly   Glaucoma Continue Combigan    Mood d/o Continue duloxetine    Obesity (BMI 30-39.9) Body mass index is 30.03 kg/m.SABRA  Weight loss should be encouraged Resume semaglutide  as an outpatient Complicates overall care and prognosis  DNR Patient confirmed to be DNR He will need an out of facility DNR form at the time of discharge         Consultants: Palliative care PT OT [facility] team   Procedures: None   Antibiotics: Unasyn  12/27-29 Ciprofloxacin  12/29-1/4  30 Day Unplanned Readmission Risk Score    Flowsheet Row ED to Hosp-Admission (Current) from 09/13/2023 in [facility] PCU  30 Day Unplanned Readmission Risk Score (%) 28.56 Filed at 09/20/2023 0801       This score is the patient's risk of an unplanned readmission within 30 days of being discharged (0 -100%). The score is based on dignosis, age, lab data, medications, orders, and past utilization.   Low:  0-14.9   Medium: 15-21.9   High: 22-29.9   Extreme: 30 and above           Subjective: No specific concerns.  He would like to return to his facility.  I spoke with his daughter, he's not as coherent.  He seems lethargic.  She was on a call and he seemed not as with it.  He has had delirium previously, seemed to be improving but now seems a little worse.   Objective: Vitals:   09/20/23 0815 09/20/23 1306   BP: (!) 143/61 (!) 146/86  Pulse: (!) 51 60  Resp:  14  Temp: 97.9 F (36.6 C) 97.9 F (36.6 C)  SpO2:      Intake/Output Summary (Last 24 hours) at 09/20/2023 1429 Last data filed at 09/20/2023 1244 Gross per 24 hour  Intake 480 ml  Output 1450 ml  Net -970 ml   Filed Weights   09/14/23 1200 09/15/23 0500  Weight: 90 kg 89.6 kg    Exam:  General:  Appears calm and comfortable and is in NAD, on  RA Eyes:  normal lids, R conjunctival hemorrhage ENT:  grossly normal hearing, lips & tongue, mmm Neck:  no LAD, masses or thyromegaly Cardiovascular:  RRR, no m/r/g. No LE edema.  Respiratory:   CTA bilaterally with no wheezes/rales/rhonchi.  Normal respiratory effort. Abdomen:  soft, NT, ND Skin:  no rash or induration seen on limited exam Musculoskeletal:  grossly normal tone BUE/BLE, good ROM, no bony abnormality Psychiatric:  blunted mood and affect, speech fluent and appropriate, AOx3 Neurologic:  CN 2-12 grossly intact, moves all extremities in coordinated fashion  Data Reviewed: I have reviewed the patient's lab results since admission.  Pertinent labs for today include:   Na++ 132 Glucose 157 BUN 42/Creatinine 1.75/GFR 37 Stable CBC COVID positive on 1/2    Family Communication: None present; I spoke with his daughter by telephone  Disposition: Status is: Inpatient Remains inpatient appropriate because: needs COVID quarantine     Time spent: 50 minutes  Unresulted Labs (From admission, onward)     Start     Ordered   09/21/23 0500  CBC with Differential/Platelet  Tomorrow morning,   R        09/20/23 1428   09/21/23 0500  Basic metabolic panel  Tomorrow morning,   R        09/20/23 1428             Author: Delon Herald, MD 09/20/2023 2:29 PM  For on call review [email or web address].

## 2023-09-20 NOTE — Care Management Important Message (Signed)
 Important Message  Patient Details  Name: James VANOVER, MD MRN: [id number] Date of Birth: 26-Jan-1931   Important Message Given:  Other (see comment)     Sherilyn Banker 09/20/2023, 12:20 PM

## 2023-09-20 NOTE — Progress Notes (Signed)
 Physical Therapy Treatment Patient Details Name: James ABDON, MD MRN: [id number] DOB: [birth year] Today's Date: 09/20/2023   History of Present Illness Pt is a 89 year old male presenting to ED with weakness, difficulty speaking, confusion; work up includes UTI, acute metabolic encephalopathy, and Covid+.  PMH significant for HLD, DM, dCHF, stroke,/TIA, hypothyroidism, memory loss, dementia, BPH, CKD-3B, obesity, OSA on CPAP, RLS, migraine headache, C. difficile, recent admission due to VRE-UTI. T/f to PCU 12/30.    PT Comments  Pt seen for PT tx with pt agreeable. Pt is able to complete bed mobility with supervision with use of bed rails, HOB elevated. Pt is able to complete transfers with min<>mod assist with improving strength. Pt engages in balance activity without LOB as well. Pt asking appropriate questions re: d/c. Recommend 24 hr assist at d/c 2/2 pt's current deficits & level of care. Will continue to follow pt acutely to progress transfers & strengthening.     If plan is discharge home, recommend the following: A lot of help with walking and/or transfers;Assist for transportation;A lot of help with bathing/dressing/bathroom   Can travel by private vehicle     No  Equipment Recommendations  None recommended by PT    Recommendations for Other Services       Precautions / Restrictions Precautions Precautions: Fall Restrictions Weight Bearing Restrictions Per Provider Order: No     Mobility  Bed Mobility Overal bed mobility: Needs Assistance Bed Mobility: Supine to Sit     Supine to sit: Modified independent (Device/Increase time), HOB elevated, Used rails     General bed mobility comments: to exit L side of bed    Transfers Overall transfer level: Needs assistance Equipment used: 1 person hand held assist Transfers: Sit to/from Stand, Bed to chair/wheelchair/BSC Sit to Stand: Mod assist, Min assist Stand pivot transfers: Mod assist, Min assist          General transfer comment: Pt is able to complete STS from EOB, recliner with min<>mod assist, stand pivot bed<>recliner with min<>mod assist.    Ambulation/Gait                   Stairs             Wheelchair Mobility     Tilt Bed    Modified Rankin (Stroke Patients Only)       Balance Overall balance assessment: Needs assistance Sitting-balance support: No upper extremity supported, Feet supported Sitting balance-Leahy Scale: Good     Standing balance support: During functional activity, Single extremity supported Standing balance-Leahy Scale: Poor                              Cognition Arousal: Alert Behavior During Therapy: WFL for tasks assessed/performed Overall Cognitive Status: Within Functional Limits for tasks assessed Area of Impairment: Following commands                       Following Commands: Follows one step commands consistently, Follows one step commands with increased time     Problem Solving: Slow processing General Comments: asking appropriate questions re: d/c        Exercises Other Exercises Other Exercises: Pt engages in reaching outside of BOS with LUE while sitting EOB for challenges to balance & core/trunk strengthening with good ability to right self to midline, no LOB.    General Comments        Pertinent Vitals/Pain  Pain Assessment Pain Assessment: No/denies pain    Home Living                          Prior Function            PT Goals (current goals can now be found in the care plan section) Acute Rehab PT Goals Patient Stated Goal: get better, go home PT Goal Formulation: With patient Time For Goal Achievement: 09/29/23 Potential to Achieve Goals: Fair Progress towards PT goals: Progressing toward goals    Frequency    Min 1X/week      PT Plan      Co-evaluation              AM-PAC PT 6 Clicks Mobility   Outcome Measure  Help needed turning from  your back to your side while in a flat bed without using bedrails?: A Little Help needed moving from lying on your back to sitting on the side of a flat bed without using bedrails?: A Little Help needed moving to and from a bed to a chair (including a wheelchair)?: A Lot Help needed standing up from a chair using your arms (e.g., wheelchair or bedside chair)?: A Lot Help needed to walk in hospital room?: Total Help needed climbing 3-5 steps with a railing? : Total 6 Click Score: 12    End of Session   Activity Tolerance: Patient tolerated treatment well Patient left: in chair;with chair alarm set;with call bell/phone within reach (reviewed use of call bell with pt)   PT Visit Diagnosis: Other abnormalities of gait and mobility (R26.89);Muscle weakness (generalized) (M62.81);Unsteadiness on feet (R26.81);Difficulty in walking, not elsewhere classified (R26.2)     Time: 1450-1510 PT Time Calculation (min) (ACUTE ONLY): 20 min  Charges:    $Therapeutic Activity: 8-22 mins PT General Charges $$ ACUTE PT VISIT: 1 Visit                     Richerd Pinal, PT, DPT 09/20/23, 3:18 PM   Richerd CHRISTELLA Pinal 09/20/2023, 3:17 PM

## 2023-09-21 DIAGNOSIS — U071 COVID-19: Secondary | ICD-10-CM | POA: Diagnosis not present

## 2023-09-21 LAB — BASIC METABOLIC PANEL
Anion gap: 10 (ref 5–15)
BUN: 43 mg/dL — ABNORMAL HIGH (ref 8–23)
CO2: 21 mmol/L — ABNORMAL LOW (ref 22–32)
Calcium: 8.1 mg/dL — ABNORMAL LOW (ref 8.9–10.3)
Chloride: 105 mmol/L (ref 98–111)
Creatinine, Ser: 1.54 mg/dL — ABNORMAL HIGH (ref 0.61–1.24)
GFR, Estimated: 43 mL/min — ABNORMAL LOW (ref >60)
Glucose, Bld: 145 mg/dL — ABNORMAL HIGH (ref 70–99)
Potassium: 4 mmol/L (ref 3.5–5.1)
Sodium: 136 mmol/L (ref 135–145)

## 2023-09-21 LAB — CBC WITH DIFFERENTIAL/PLATELET
Abs Immature Granulocytes: 0.05 10*3/uL (ref 0.00–0.07)
Basophils Absolute: 0 10*3/uL (ref 0.0–0.1)
Basophils Relative: 0 %
Eosinophils Absolute: 0 10*3/uL (ref 0.0–0.5)
Eosinophils Relative: 0 %
HCT: 38.8 % — ABNORMAL LOW (ref 39.0–52.0)
Hemoglobin: 13.1 g/dL (ref 13.0–17.0)
Immature Granulocytes: 1 %
Lymphocytes Relative: 20 %
Lymphs Abs: 1.8 10*3/uL (ref 0.7–4.0)
MCH: 29.2 pg (ref 26.0–34.0)
MCHC: 33.8 g/dL (ref 30.0–36.0)
MCV: 86.6 fL (ref 80.0–100.0)
Monocytes Absolute: 0.7 10*3/uL (ref 0.1–1.0)
Monocytes Relative: 7 %
Neutro Abs: 6.6 10*3/uL (ref 1.7–7.7)
Neutrophils Relative %: 72 %
Platelets: 224 10*3/uL (ref 150–400)
RBC: 4.48 MIL/uL (ref 4.22–5.81)
RDW: 12.7 % (ref 11.5–15.5)
WBC: 9.1 10*3/uL (ref 4.0–10.5)
nRBC: 0 % (ref 0.0–0.2)

## 2023-09-21 LAB — GLUCOSE, CAPILLARY
Glucose-Capillary: 125 mg/dL — ABNORMAL HIGH (ref 70–99)
Glucose-Capillary: 86 mg/dL (ref 70–99)
Glucose-Capillary: 60 mg/dL — ABNORMAL LOW (ref 70–99)
Glucose-Capillary: 86 mg/dL (ref 70–99)

## 2023-09-21 NOTE — Consult Note (Addendum)
 [facility] Liaison Note  09/21/2023  James GORMAN Fearing, MD 1931-03-18 [id number]  Location: RN Hospital Liaison screened the patient remotely at [facility].  Insurance: [facility]   James GORMAN Fearing, MD is a 89 y.o. male who is a Primary Care Patient of Rilla Baller, MD with [facility]. The patient was screened for readmission hospitalization with noted high risk score for unplanned readmission risk with 1 IP/3 ED in 6 months.  The patient was assessed for potential Care Management service needs for post hospital transition for care coordination. Review of patient's electronic medical record reveals patient was admitted with UTI. Recommended for SNF level of care. Pt currently pending SNF choice for ongoing rehabilitation. If pt transitions to SNF level of care the facility will continue to address pt's needs.   Plan: [facility] Liaison will continue to follow progress and disposition to asess for post hospital community care coordination/management needs.  Referral request for community care coordination: pending disposition.   VBCI Care Management/Population Health does not replace or interfere with any arrangements made by the Inpatient Transition of Care team.   For questions contact:   Olam Ku, RN, [facility] Liaison [place]   [facility], Population Health Office Hours MTWF  8:00 am-6:00 pm Direct Dial: [phone] mobile [phone] [Office toll free line] Office Hours are M-F 8:30 - 5 pm [email or web address]

## 2023-09-21 NOTE — Progress Notes (Signed)
 Progress Note   Patient: James SEEBERGER, MD FMW:[id number] DOB: 12-20-1930 DOA: 09/13/2023     5 DOS: the patient was seen and examined on 09/21/2023   Brief hospital course: 89yo with h/o HLD, DM, chronic diastolic CHF, CVA, hypothyroidism, dementia, BPH, stage 3b CKD, OSA on CPAP, and recent admission for VRE UTI who presented on 12/26 with lethargy.  Initial concern for CVA, negative MRI/CTA.  UA positive for Proteus mirabilis UTI on 12/20, started on cefuroxime  by urology.  He was found to have COVID with encephalopathy and acute hypoxic respiratory failure, treated with Decadron .    Assessment and Plan:  COVID-19 infection  Reported to have acute respiratory failure but O2 sats are not documented <90% Daughter reported that the patient had several sick contacts during the holiday season COVID-19 positive on admission and again on 1/2 Predominant COVID symptoms appear to have been encephalopathy and generalized weakness He is currently comfortable on RA COVID isolation  Continue steroids but transition from Decadron  6 mg IV daily to 4 mg BID x 5 total days (through 1/2) He will complete his 10-day quarantine on 1/7 He may be able to return to his facility prior to the end of the 10 days, but daughter would have to arrange for an alternate [facility] agency    UTI (urinary tract infection) Patient recently had VRE-UTI He has been treated with cefuroxime  for Proteus mirabilis UTI recently Initial urinalysis done on 12/26 positive for leukocytes but negative for bacteria  Urine culture now growing Morganella Morgagni Continue ciprofloxacin  per sensitivities but will transition to PO for a total of 7 days (through 1/4)   History of CVA (cerebrovascular accident) Continue Crestor  and Plavix  Imaging survey negative for acute CVA   Hyperlipidemia associated with type 2 diabetes mellitus (HCC) Continue Crestor    HTN (hypertension) IV hydralazine  as needed   Type II diabetes mellitus  with renal manifestations Recent A1c 6.7, well-controlled - his A1c goal is <8 Patient taking NovoLog , semaglutide , Tresiba  80 units daily Decrease Semglee  to 20 units daily Sensitive sliding scale Avoid hypoglycemia Continue gabapentin    Chronic diastolic CHF (congestive heart failure)  2D echo on 01/02/2022 showed EF of 55-60% CHF seem to be compensated   Chronic kidney disease, stage 3b  Renal function stable Recent baseline creatinine 1.4-1.9   Hypothyroidism Continue Synthroid    Restless leg syndrome, severe Continue Pramipexole    Right leg swelling Lower extremity venous Doppler negative for VTE   BPH with obstruction/lower urinary tract symptoms Continue tamsulosin    OSA Continue CPAP nightly   Glaucoma Continue Combigan    Mood d/o Continue duloxetine    Obesity (BMI 30-39.9) Body mass index is 30.03 kg/m.SABRA  Weight loss should be encouraged Resume semaglutide  as an outpatient Complicates overall care and prognosis   DNR Patient confirmed to be DNR He will need an out of facility DNR form at the time of discharge         Consultants: Palliative care PT OT [facility] team   Procedures: None   Antibiotics: Unasyn  12/27-29 Ciprofloxacin  12/29-1/4   30 Day Unplanned Readmission Risk Score    Flowsheet Row ED to Hosp-Admission (Current) from 09/13/2023 in [facility] PCU  30 Day Unplanned Readmission Risk Score (%) 24.98 Filed at 09/21/2023 0801       This score is the patient's risk of an unplanned readmission within 30 days of being discharged (0 -100%). The score is based on dignosis, age, lab data, medications, orders, and past utilization.   Low:  0-14.9   Medium: 15-21.9   High: 22-29.9   Extreme: 30 and above           Subjective: He is frustrated and wants to go back to his ILF.   Objective: Vitals:   09/20/23 2338 09/21/23 0822  BP: (!) 119/59 (!) 124/58  Pulse: (!) 52 (!) 57  Resp: 18 18  Temp: 98 F (36.7  C) 97.6 F (36.4 C)  SpO2: 98% 100%    Intake/Output Summary (Last 24 hours) at 09/21/2023 1450 Last data filed at 09/21/2023 0530 Gross per 24 hour  Intake 360 ml  Output 925 ml  Net -565 ml   Filed Weights   09/14/23 1200 09/15/23 0500  Weight: 90 kg 89.6 kg    Exam:  General:  Appears calm and comfortable and is in NAD, on  RA Eyes:  normal lids, R conjunctival hemorrhage ENT:  grossly normal hearing, lips & tongue, mmm Neck:  no LAD, masses or thyromegaly Cardiovascular:  RRR, no m/r/g. No LE edema.  Respiratory:   CTA bilaterally with no wheezes/rales/rhonchi.  Normal respiratory effort. Abdomen:  soft, NT, ND Skin:  no rash or induration seen on limited exam Musculoskeletal:  grossly normal tone BUE/BLE, good ROM, no bony abnormality Psychiatric:  blunted mood and affect, speech fluent and appropriate, AOx3 Neurologic:  CN 2-12 grossly intact, moves all extremities in coordinated fashion  Data Reviewed: I have reviewed the patient's lab results since admission.  Pertinent labs for today include:   Glucose 145 BUN 43/Creatinine 1.54/GFR 43, stable Stable CBC     Family Communication: None present  Disposition: Status is: Inpatient Remains inpatient appropriate because: ongoing COVID isolation     Time spent: 50 minutes  Unresulted Labs (From admission, onward)     Start     Ordered   09/22/23 0500  CBC with Differential/Platelet  Tomorrow morning,   R        09/21/23 1450   09/22/23 0500  Basic metabolic panel  Tomorrow morning,   R        09/21/23 1450             Author: Delon Herald, MD 09/21/2023 2:50 PM  For on call review [email or web address].

## 2023-09-21 NOTE — Progress Notes (Signed)
     Referral previously received for Layman GORMAN Fearing, MD for goals of care discussion. Noted most recent palliative in-person assessment dated 09/18/2022 at which time it was recommended to follow from a distance/chart check.  Chart reviewed for recent provider notes, nurse notes and TOC notes and updates received from RN.   Plan for Ms. Somoza to discharge once quarantine is completed due to HH/SNF guidelines. Per nursing, no acute palliative needs at this time.  No plan for in person follow-up at this time. Please contact the palliative medicine provider on service for any new/urgent needs that require our assistance with this patient.  Thank you for your referral and allowing PMT to assist in Layman GORMAN Fearing, MD's care.   Waddell Lesches, NP Palliative Medicine Team Phone: [phone]  NO CHARGE

## 2023-09-21 NOTE — TOC Progression Note (Signed)
 Transition of Care [facility]) - Progression Note    Patient Details  Name: James NALL, MD MRN: [id number] Date of Birth: 01/16/1931  [facility]) CM/SW Contact  Tomasa JAYSON Childes, RN Phone Number: 09/21/2023, 4:12 PM  Clinical Narrative:    Spoke with patient's daughter to give bed offers. She is considering choices and will make a decision today.    Expected Discharge Plan: Home w Home Health Services Barriers to Discharge: Continued Medical Work up  Expected Discharge Plan and Services     Post Acute Care Choice: Home Health Living arrangements for the past 2 months: Independent Living Facility                                       Social Determinants of Health (SDOH) Interventions SDOH Screenings   Food Insecurity: No Food Insecurity (09/14/2023)  Housing: Low Risk  (09/14/2023)  Transportation Needs: No Transportation Needs (09/14/2023)  Utilities: Not At Risk (09/14/2023)  Depression (PHQ2-9): Low Risk  (05/10/2023)  Financial Resource Strain: Low Risk  (12/30/2020)  Social Connections: Unknown (09/18/2023)  Tobacco Use: Low Risk  (09/14/2023)    Readmission Risk Interventions     No data to display

## 2023-09-21 NOTE — Progress Notes (Signed)
 PT Cancellation Note  Patient Details Name: James TABOR, MD MRN: [id number] DOB: [birth year]   Cancelled Treatment:    Reason Eval/Treat Not Completed: Other (comment);Fatigue/lethargy limiting ability to participate (Treatmen tattempted; pt just finished tranfers back ot bed, after 5 hours in recliner; he asks to prioritize rest at this time. Encouraged OOB to chair with meals over the weekend to cont practice c transfers.)  3:33 PM, 09/21/23 Peggye JAYSON Linear, PT, DPT Physical Therapist - [facility]  [phone] (ASCOM)    Normalee Sistare C 09/21/2023, 3:32 PM

## 2023-09-21 NOTE — Progress Notes (Signed)
 Occupational Therapy Treatment Patient Details Name: James LEFEBRE, MD MRN: [id number] DOB: [birth year] Today's Date: 09/21/2023   History of present illness Pt is a 89 year old male presenting to ED with weakness, difficulty speaking, confusion; work up includes UTI, acute metabolic encephalopathy, and Covid+.  PMH significant for HLD, DM, dCHF, stroke,/TIA, hypothyroidism, memory loss, dementia, BPH, CKD-3B, obesity, OSA on CPAP, RLS, migraine headache, C. difficile, recent admission due to VRE-UTI. T/f to PCU 12/30.   OT comments  Chart reviewed to date, pt greeted in chair, requesting to return to bed. Pt is alert, oriented to self, place, grossly to situation but tangential throughout. Appropriate safety awareness noted for ADL mobility. OT tx session targeted improving functional activity tolerance to optimize ADL performance. MIN A required for SPT from chair>bed, MOD A for stand>sit. Grooming tasks completed sitting on edge of bed with SET UP, pt maintaining sitting on edge of bed for approx 10 minutes. Discussed in detail discharge recommendations. Pt is making progress towards goals, OT will follow acutely.       If plan is discharge home, recommend the following:  A lot of help with walking and/or transfers;A little help with bathing/dressing/bathroom;Assistance with cooking/housework;Direct supervision/assist for medications management;Direct supervision/assist for financial management;Assist for transportation   Equipment Recommendations  Other (comment) (pt has recommended equipment at home)    Recommendations for Other Services      Precautions / Restrictions Precautions Precautions: Fall       Mobility Bed Mobility Overal bed mobility: Needs Assistance Bed Mobility: Sit to Supine       Sit to supine: Supervision, HOB elevated        Transfers Overall transfer level: Needs assistance Equipment used: 1 person hand held assist Transfers: Sit to/from Stand, Bed  to chair/wheelchair/BSC Sit to Stand: Min assist, Mod assist Stand pivot transfers: Min assist               Balance Overall balance assessment: Needs assistance Sitting-balance support: No upper extremity supported, Feet supported Sitting balance-Leahy Scale: Good Sitting balance - Comments: good static/dynamic sitting balance seated unsupported on edge of bed   Standing balance support: During functional activity, Single extremity supported Standing balance-Leahy Scale: Poor                             ADL either performed or assessed with clinical judgement   ADL Overall ADL's : Needs assistance/impaired Eating/Feeding: Set up;Sitting   Grooming: Wash/dry face;Sitting;Set up Grooming Details (indicate cue type and reason): seated on edge of bed             Lower Body Dressing: Maximal assistance;Sitting/lateral leans Lower Body Dressing Details (indicate cue type and reason): doff shoes Toilet Transfer: Minimal assistance Toilet Transfer Details (indicate cue type and reason): simulated to the patients R with MIN A for stand pivot transfer, MOD A for stand>sit                Extremity/Trunk Assessment              Vision       Perception     Praxis      Cognition Arousal: Alert Behavior During Therapy: WFL for tasks assessed/performed Overall Cognitive Status: No family/caregiver present to determine baseline cognitive functioning Area of Impairment: Following commands, Awareness, Problem solving  Following Commands: Follows one step commands consistently, Follows one step commands with increased time     Problem Solving: Slow processing General Comments: pt slightly confabulatory, tangential        Exercises Other Exercises Other Exercises: edu re: role of OT, role of rehab, discharge recommendadtions,    Shoulder Instructions       General Comments spo2 >90% on RA throughout, HR in 60s bpm  with mobility    Pertinent Vitals/ Pain       Pain Assessment Pain Assessment: No/denies pain  Home Living                                          Prior Functioning/Environment              Frequency  Min 1X/week        Progress Toward Goals  OT Goals(current goals can now be found in the care plan section)  Progress towards OT goals: Progressing toward goals  Acute Rehab OT Goals Time For Goal Achievement: 10/02/23  Plan      Co-evaluation                 AM-PAC OT 6 Clicks Daily Activity     Outcome Measure   Help from another person eating meals?: None Help from another person taking care of personal grooming?: None Help from another person toileting, which includes using toliet, bedpan, or urinal?: A Lot Help from another person bathing (including washing, rinsing, drying)?: A Lot Help from another person to put on and taking off regular upper body clothing?: A Little Help from another person to put on and taking off regular lower body clothing?: A Lot 6 Click Score: 17    End of Session    OT Visit Diagnosis: Other abnormalities of gait and mobility (R26.89)   Activity Tolerance Patient tolerated treatment well   Patient Left in bed;with call bell/phone within reach;with bed alarm set   Nurse Communication Mobility status        Time: 8570-8542 OT Time Calculation (min): 28 min  Charges: OT General Charges $OT Visit: 1 Visit OT Treatments $Self Care/Home Management : 8-22 mins $Therapeutic Activity: 8-22 mins  Therisa Sheffield, OTD OTR/L  09/21/23, 3:59 PM

## 2023-09-21 NOTE — Progress Notes (Signed)
 Daughter Lurena Joiner called and spoke with this RN, states she will not make a decision about disposition until a covid test is repeated 09/22/23. Message relayed to [facility], MD, and primary RN.

## 2023-09-21 NOTE — Progress Notes (Signed)
 Hypoglycemic Event  CBG: 60  Treatment: 4 oz juice/soda  Symptoms: Pale  Follow-up CBG: Time:2146 CBG Result:86  Possible Reasons for Event: Inadequate meal intake  Comments/MD notified:NA    Anner Baity [name]

## 2023-09-21 NOTE — Plan of Care (Signed)
  Problem: Education: Goal: Ability to describe self-care measures that may prevent or decrease complications (Diabetes Survival Skills Education) will improve Outcome: Progressing Goal: Individualized Educational Video(s) Outcome: Progressing   Problem: Coping: Goal: Ability to adjust to condition or change in health will improve Outcome: Progressing   Problem: Fluid Volume: Goal: Ability to maintain a balanced intake and output will improve Outcome: Progressing   Problem: Health Behavior/Discharge Planning: Goal: Ability to identify and utilize available resources and services will improve Outcome: Progressing Goal: Ability to manage health-related needs will improve Outcome: Progressing   Problem: Metabolic: Goal: Ability to maintain appropriate glucose levels will improve Outcome: Progressing   Problem: Nutritional: Goal: Maintenance of adequate nutrition will improve Outcome: Progressing Goal: Progress toward achieving an optimal weight will improve Outcome: Progressing   Problem: Skin Integrity: Goal: Risk for impaired skin integrity will decrease Outcome: Progressing   Problem: Tissue Perfusion: Goal: Adequacy of tissue perfusion will improve Outcome: Progressing   Problem: Education: Goal: Knowledge of General Education information will improve Description: Including pain rating scale, medication(s)/side effects and non-pharmacologic comfort measures Outcome: Progressing   Problem: Health Behavior/Discharge Planning: Goal: Ability to manage health-related needs will improve Outcome: Progressing   Problem: Clinical Measurements: Goal: Ability to maintain clinical measurements within normal limits will improve Outcome: Progressing Goal: Will remain free from infection Outcome: Progressing Goal: Diagnostic test results will improve Outcome: Progressing Goal: Respiratory complications will improve Outcome: Progressing Goal: Cardiovascular complication will  be avoided Outcome: Progressing   Problem: Activity: Goal: Risk for activity intolerance will decrease Outcome: Progressing   Problem: Nutrition: Goal: Adequate nutrition will be maintained Outcome: Progressing   Problem: Coping: Goal: Level of anxiety will decrease Outcome: Progressing   Problem: Elimination: Goal: Will not experience complications related to bowel motility Outcome: Progressing Goal: Will not experience complications related to urinary retention Outcome: Progressing   Problem: Pain Management: Goal: General experience of comfort will improve Outcome: Progressing   Problem: Safety: Goal: Ability to remain free from injury will improve Outcome: Progressing   Problem: Skin Integrity: Goal: Risk for impaired skin integrity will decrease Outcome: Progressing   Problem: Education: Goal: Knowledge of risk factors and measures for prevention of condition will improve Outcome: Progressing   Problem: Coping: Goal: Psychosocial and spiritual needs will be supported Outcome: Progressing   Problem: Respiratory: Goal: Will maintain a patent airway Outcome: Progressing Goal: Complications related to the disease process, condition or treatment will be avoided or minimized Outcome: Progressing

## 2023-09-22 DIAGNOSIS — M7989 Other specified soft tissue disorders: Secondary | ICD-10-CM

## 2023-09-22 DIAGNOSIS — I5032 Chronic diastolic (congestive) heart failure: Secondary | ICD-10-CM | POA: Diagnosis not present

## 2023-09-22 DIAGNOSIS — I10 Essential (primary) hypertension: Secondary | ICD-10-CM

## 2023-09-22 DIAGNOSIS — N3000 Acute cystitis without hematuria: Secondary | ICD-10-CM | POA: Diagnosis not present

## 2023-09-22 DIAGNOSIS — Z8673 Personal history of transient ischemic attack (TIA), and cerebral infarction without residual deficits: Secondary | ICD-10-CM | POA: Diagnosis not present

## 2023-09-22 LAB — CBC WITH DIFFERENTIAL/PLATELET
Abs Immature Granulocytes: 0.07 10*3/uL (ref 0.00–0.07)
Basophils Absolute: 0 10*3/uL (ref 0.0–0.1)
Basophils Relative: 0 %
Eosinophils Absolute: 0.1 10*3/uL (ref 0.0–0.5)
Eosinophils Relative: 1 %
HCT: 39.4 % (ref 39.0–52.0)
Hemoglobin: 13.4 g/dL (ref 13.0–17.0)
Immature Granulocytes: 1 %
Lymphocytes Relative: 25 %
Lymphs Abs: 2.6 10*3/uL (ref 0.7–4.0)
MCH: 30 pg (ref 26.0–34.0)
MCHC: 34 g/dL (ref 30.0–36.0)
MCV: 88.3 fL (ref 80.0–100.0)
Monocytes Absolute: 1 10*3/uL (ref 0.1–1.0)
Monocytes Relative: 9 %
Neutro Abs: 6.5 10*3/uL (ref 1.7–7.7)
Neutrophils Relative %: 64 %
Platelets: 236 10*3/uL (ref 150–400)
RBC: 4.46 MIL/uL (ref 4.22–5.81)
RDW: 12.8 % (ref 11.5–15.5)
WBC: 10.2 10*3/uL (ref 4.0–10.5)
nRBC: 0 % (ref 0.0–0.2)

## 2023-09-22 LAB — BASIC METABOLIC PANEL
Anion gap: 9 (ref 5–15)
BUN: 43 mg/dL — ABNORMAL HIGH (ref 8–23)
CO2: 22 mmol/L (ref 22–32)
Calcium: 8.1 mg/dL — ABNORMAL LOW (ref 8.9–10.3)
Chloride: 106 mmol/L (ref 98–111)
Creatinine, Ser: 1.7 mg/dL — ABNORMAL HIGH (ref 0.61–1.24)
GFR, Estimated: 38 mL/min — ABNORMAL LOW (ref >60)
Glucose, Bld: 153 mg/dL — ABNORMAL HIGH (ref 70–99)
Potassium: 3.5 mmol/L (ref 3.5–5.1)
Sodium: 137 mmol/L (ref 135–145)

## 2023-09-22 LAB — GLUCOSE, CAPILLARY
Glucose-Capillary: 143 mg/dL — ABNORMAL HIGH (ref 70–99)
Glucose-Capillary: 145 mg/dL — ABNORMAL HIGH (ref 70–99)
Glucose-Capillary: 172 mg/dL — ABNORMAL HIGH (ref 70–99)
Glucose-Capillary: 220 mg/dL — ABNORMAL HIGH (ref 70–99)
Glucose-Capillary: 58 mg/dL — ABNORMAL LOW (ref 70–99)
Glucose-Capillary: 80 mg/dL (ref 70–99)
Glucose-Capillary: 93 mg/dL (ref 70–99)

## 2023-09-22 NOTE — Plan of Care (Signed)
   Problem: Clinical Measurements: Goal: Respiratory complications will improve Outcome: Progressing

## 2023-09-22 NOTE — Plan of Care (Signed)
  Problem: Education: Goal: Ability to describe self-care measures that may prevent or decrease complications (Diabetes Survival Skills Education) will improve Outcome: Progressing Goal: Individualized Educational Video(s) Outcome: Progressing   Problem: Coping: Goal: Ability to adjust to condition or change in health will improve Outcome: Progressing   Problem: Fluid Volume: Goal: Ability to maintain a balanced intake and output will improve Outcome: Progressing   Problem: Health Behavior/Discharge Planning: Goal: Ability to identify and utilize available resources and services will improve Outcome: Progressing Goal: Ability to manage health-related needs will improve Outcome: Progressing   Problem: Metabolic: Goal: Ability to maintain appropriate glucose levels will improve Outcome: Progressing   Problem: Nutritional: Goal: Maintenance of adequate nutrition will improve Outcome: Progressing Goal: Progress toward achieving an optimal weight will improve Outcome: Progressing   Problem: Skin Integrity: Goal: Risk for impaired skin integrity will decrease Outcome: Progressing   Problem: Tissue Perfusion: Goal: Adequacy of tissue perfusion will improve Outcome: Progressing   Problem: Education: Goal: Knowledge of General Education information will improve Description: Including pain rating scale, medication(s)/side effects and non-pharmacologic comfort measures Outcome: Progressing   Problem: Health Behavior/Discharge Planning: Goal: Ability to manage health-related needs will improve Outcome: Progressing   Problem: Clinical Measurements: Goal: Ability to maintain clinical measurements within normal limits will improve Outcome: Progressing Goal: Will remain free from infection Outcome: Progressing Goal: Diagnostic test results will improve Outcome: Progressing Goal: Respiratory complications will improve Outcome: Progressing Goal: Cardiovascular complication will  be avoided Outcome: Progressing   Problem: Activity: Goal: Risk for activity intolerance will decrease Outcome: Progressing   Problem: Nutrition: Goal: Adequate nutrition will be maintained Outcome: Progressing   Problem: Coping: Goal: Level of anxiety will decrease Outcome: Progressing   Problem: Elimination: Goal: Will not experience complications related to bowel motility Outcome: Progressing Goal: Will not experience complications related to urinary retention Outcome: Progressing   Problem: Pain Management: Goal: General experience of comfort will improve Outcome: Progressing   Problem: Safety: Goal: Ability to remain free from injury will improve Outcome: Progressing   Problem: Skin Integrity: Goal: Risk for impaired skin integrity will decrease Outcome: Progressing   Problem: Education: Goal: Knowledge of risk factors and measures for prevention of condition will improve Outcome: Progressing   Problem: Coping: Goal: Psychosocial and spiritual needs will be supported Outcome: Progressing   Problem: Respiratory: Goal: Will maintain a patent airway Outcome: Progressing Goal: Complications related to the disease process, condition or treatment will be avoided or minimized Outcome: Progressing

## 2023-09-22 NOTE — Plan of Care (Signed)
  Problem: Education: Goal: Ability to describe self-care measures that may prevent or decrease complications (Diabetes Survival Skills Education) will improve Outcome: Progressing Goal: Individualized Educational Video(s) Outcome: Progressing   Problem: Coping: Goal: Ability to adjust to condition or change in health will improve Outcome: Progressing   Problem: Fluid Volume: Goal: Ability to maintain a balanced intake and output will improve Outcome: Progressing   Problem: Health Behavior/Discharge Planning: Goal: Ability to identify and utilize available resources and services will improve Outcome: Progressing   Problem: Metabolic: Goal: Ability to maintain appropriate glucose levels will improve Outcome: Progressing   Problem: Nutritional: Goal: Maintenance of adequate nutrition will improve Outcome: Progressing Goal: Progress toward achieving an optimal weight will improve Outcome: Progressing   Problem: Education: Goal: Knowledge of General Education information will improve Description: Including pain rating scale, medication(s)/side effects and non-pharmacologic comfort measures Outcome: Progressing   Problem: Health Behavior/Discharge Planning: Goal: Ability to manage health-related needs will improve Outcome: Progressing

## 2023-09-22 NOTE — Progress Notes (Addendum)
 Progress Note   Patient: James GRUSZKA, MD FMW:[id number] DOB: January 02, 1931 DOA: 09/13/2023     6 DOS: the patient was seen and examined on 09/22/2023   Brief hospital course: 89yo with h/o HLD, DM, chronic diastolic CHF, CVA, hypothyroidism, dementia, BPH, stage 3b CKD, OSA on CPAP, and recent admission for VRE UTI who presented on 12/26 with lethargy.  Initial concern for CVA, negative MRI/CTA.  UA positive for Proteus mirabilis UTI on 12/20, started on cefuroxime  by urology.  He was found to have COVID with encephalopathy and acute hypoxic respiratory failure, treated with Decadron .    1/4: Vital stable.  Slight worsening of creatinine but remained within baseline. Patient with need to go to SNF, quarantine on 09/25/2023. Will complete antibiotics for UTI today. PT is recommending SNF but family is trying to arrange 24/7 care at his ILF as patient does not want to go to rehab place.  Having difficulty with his positive COVID status.  They are recommending negative COVID test, explained to daughter that it is not recommended to repeat for 28-month as COVID can remain positive for 41-month but patient will not be infectious.  Assessment and Plan:  COVID-19 infection  Reported to have acute respiratory failure but O2 sats are not documented <90% Daughter reported that the patient had several sick contacts during the holiday season COVID-19 positive on admission and again on 1/2 Predominant COVID symptoms appear to have been encephalopathy and generalized weakness He is currently comfortable on RA COVID isolation  Continue steroids but transition from Decadron  6 mg IV daily to 4 mg BID x 5 total days (through 1/2) He will complete his 10-day quarantine on 1/7 He may be able to return to his facility prior to the end of the 10 days, but daughter would have to arrange for an alternate [facility] agency    UTI (urinary tract infection) Patient recently had VRE-UTI He has been treated with cefuroxime   for Proteus mirabilis UTI recently Initial urinalysis done on 12/26 positive for leukocytes but negative for bacteria  Urine culture now growing Morganella Morgagni Continue ciprofloxacin  per sensitivities but will transition to PO for a total of 7 days (through 1/4)   History of CVA (cerebrovascular accident) Continue Crestor  and Plavix  Imaging survey negative for acute CVA   Hyperlipidemia associated with type 2 diabetes mellitus (HCC) Continue Crestor    HTN (hypertension) IV hydralazine  as needed   Type II diabetes mellitus with renal manifestations Recent A1c 6.7, well-controlled - his A1c goal is <8 Patient taking NovoLog , semaglutide , Tresiba  80 units daily Decrease Semglee  to 20 units daily Sensitive sliding scale Avoid hypoglycemia Continue gabapentin    Chronic diastolic CHF (congestive heart failure)  2D echo on 01/02/2022 showed EF of 55-60% CHF seem to be compensated   Chronic kidney disease, stage 3b  Renal function stable Recent baseline creatinine 1.4-1.9   Hypothyroidism Continue Synthroid    Restless leg syndrome, severe Continue Pramipexole    Right leg swelling Lower extremity venous Doppler negative for VTE   BPH with obstruction/lower urinary tract symptoms Continue tamsulosin    OSA Continue CPAP nightly   Glaucoma Continue Combigan    Mood d/o Continue duloxetine    Obesity (BMI 30-39.9) Body mass index is 30.03 kg/m.SABRA  Weight loss should be encouraged Resume semaglutide  as an outpatient Complicates overall care and prognosis   DNR Patient confirmed to be DNR He will need an out of facility DNR form at the time of discharge   Consultants: Palliative care PT OT [facility] team  Procedures: None   Antibiotics: Unasyn  12/27-29 Ciprofloxacin  12/29-1/4   30 Day Unplanned Readmission Risk Score    Flowsheet Row ED to Hosp-Admission (Current) from 09/13/2023 in [facility] PCU  30 Day Unplanned Readmission Risk Score  (%) 24.98 Filed at 09/21/2023 0801       This score is the patient's risk of an unplanned readmission within 30 days of being discharged (0 -100%). The score is based on dignosis, age, lab data, medications, orders, and past utilization.   Low:  0-14.9   Medium: 15-21.9   High: 22-29.9   Extreme: 30 and above          Subjective: Patient was seen and examined today.  No new concern.   Objective: Vitals:   09/22/23 0750 09/22/23 1155  BP: (!) 134/57 126/71  Pulse: (!) 57 63  Resp: 16 16  Temp: 97.9 F (36.6 C) 97.7 F (36.5 C)  SpO2: 94% 97%    Intake/Output Summary (Last 24 hours) at 09/22/2023 1452 Last data filed at 09/22/2023 0813 Gross per 24 hour  Intake --  Output 1350 ml  Net -1350 ml   Filed Weights   09/14/23 1200 09/15/23 0500  Weight: 90 kg 89.6 kg    Exam:  General.  Frail elderly man, in no acute distress. Pulmonary.  Lungs clear bilaterally, normal respiratory effort. CV.  Regular rate and rhythm, no JVD, rub or murmur. Abdomen.  Soft, nontender, nondistended, BS positive. CNS.  Alert and oriented .  No focal neurologic deficit. Extremities.  No edema, no cyanosis, pulses intact and symmetrical. Psychiatry.  Judgment and insight appears normal.   Data Reviewed: Prior data reviewed.   Family Communication: Talked with daughter on phone.  Disposition: Status is: Inpatient Remains inpatient appropriate because: ongoing COVID isolation  DVT prophylaxis: Lovenox  Time spent: 50 minutes  Unresulted Labs (From admission, onward)    None        Author: Amaryllis Dare, MD 09/22/2023 2:52 PM  For on call review [email or web address].

## 2023-09-22 NOTE — Progress Notes (Signed)
 Hypoglycemic Event  CBG: 58 @0058   Treatment: 8 oz juice/soda  Symptoms: None  Follow-up CBG: Time:0117 CBG Result:80  Possible Reasons for Event: Inadequate meal intake  Comments/MD notified:NA    James Bell [name]

## 2023-09-23 DIAGNOSIS — N3000 Acute cystitis without hematuria: Secondary | ICD-10-CM | POA: Diagnosis not present

## 2023-09-23 DIAGNOSIS — E039 Hypothyroidism, unspecified: Secondary | ICD-10-CM | POA: Diagnosis not present

## 2023-09-23 DIAGNOSIS — I5032 Chronic diastolic (congestive) heart failure: Secondary | ICD-10-CM | POA: Diagnosis not present

## 2023-09-23 DIAGNOSIS — Z8673 Personal history of transient ischemic attack (TIA), and cerebral infarction without residual deficits: Secondary | ICD-10-CM | POA: Diagnosis not present

## 2023-09-23 LAB — GLUCOSE, CAPILLARY
Glucose-Capillary: 42 mg/dL — CL (ref 70–99)
Glucose-Capillary: 56 mg/dL — ABNORMAL LOW (ref 70–99)
Glucose-Capillary: 76 mg/dL (ref 70–99)
Glucose-Capillary: 183 mg/dL — ABNORMAL HIGH (ref 70–99)
Glucose-Capillary: 156 mg/dL — ABNORMAL HIGH (ref 70–99)
Glucose-Capillary: 133 mg/dL — ABNORMAL HIGH (ref 70–99)

## 2023-09-23 NOTE — Plan of Care (Signed)
  Problem: Education: Goal: Ability to describe self-care measures that may prevent or decrease complications (Diabetes Survival Skills Education) will improve Outcome: Progressing Goal: Individualized Educational Video(s) Outcome: Progressing   Problem: Coping: Goal: Ability to adjust to condition or change in health will improve Outcome: Progressing   Problem: Fluid Volume: Goal: Ability to maintain a balanced intake and output will improve Outcome: Progressing   Problem: Health Behavior/Discharge Planning: Goal: Ability to identify and utilize available resources and services will improve Outcome: Progressing Goal: Ability to manage health-related needs will improve Outcome: Progressing   Problem: Metabolic: Goal: Ability to maintain appropriate glucose levels will improve Outcome: Progressing   Problem: Nutritional: Goal: Maintenance of adequate nutrition will improve Outcome: Progressing Goal: Progress toward achieving an optimal weight will improve Outcome: Progressing   Problem: Skin Integrity: Goal: Risk for impaired skin integrity will decrease Outcome: Progressing   Problem: Tissue Perfusion: Goal: Adequacy of tissue perfusion will improve Outcome: Progressing   Problem: Education: Goal: Knowledge of General Education information will improve Description: Including pain rating scale, medication(s)/side effects and non-pharmacologic comfort measures Outcome: Progressing   Problem: Health Behavior/Discharge Planning: Goal: Ability to manage health-related needs will improve Outcome: Progressing   Problem: Clinical Measurements: Goal: Ability to maintain clinical measurements within normal limits will improve Outcome: Progressing Goal: Will remain free from infection Outcome: Progressing Goal: Diagnostic test results will improve Outcome: Progressing Goal: Respiratory complications will improve Outcome: Progressing Goal: Cardiovascular complication will  be avoided Outcome: Progressing   Problem: Activity: Goal: Risk for activity intolerance will decrease Outcome: Progressing   Problem: Nutrition: Goal: Adequate nutrition will be maintained Outcome: Progressing   Problem: Coping: Goal: Level of anxiety will decrease Outcome: Progressing   Problem: Elimination: Goal: Will not experience complications related to bowel motility Outcome: Progressing Goal: Will not experience complications related to urinary retention Outcome: Progressing   Problem: Pain Management: Goal: General experience of comfort will improve Outcome: Progressing   Problem: Safety: Goal: Ability to remain free from injury will improve Outcome: Progressing   Problem: Skin Integrity: Goal: Risk for impaired skin integrity will decrease Outcome: Progressing   Problem: Education: Goal: Knowledge of risk factors and measures for prevention of condition will improve Outcome: Progressing   Problem: Coping: Goal: Psychosocial and spiritual needs will be supported Outcome: Progressing   Problem: Respiratory: Goal: Will maintain a patent airway Outcome: Progressing Goal: Complications related to the disease process, condition or treatment will be avoided or minimized Outcome: Progressing

## 2023-09-23 NOTE — Progress Notes (Signed)
 Couple Progress Note   Patient: James AKKERMAN, James Bell FMW:[id number] DOB: 19-Jul-1931 DOA: 09/13/2023     7 DOS: the patient was seen and examined on 09/23/2023   Brief hospital course: 89yo with h/o HLD, DM, chronic diastolic CHF, CVA, hypothyroidism, dementia, BPH, stage 3b CKD, OSA on CPAP, and recent admission for VRE UTI who presented on 12/26 with lethargy.  Initial concern for CVA, negative MRI/CTA.  UA positive for Proteus mirabilis UTI on 12/20, started on cefuroxime  by urology.  He was found to have COVID with encephalopathy and acute hypoxic respiratory failure, treated with Decadron .    1/4: Vital stable.  Slight worsening of creatinine but remained within baseline. Patient with need to go to SNF, quarantine on 09/25/2023. Will complete antibiotics for UTI today. PT is recommending SNF but family is trying to arrange 24/7 care at his ILF as patient does not want to go to rehab place.  Having difficulty with his positive COVID status.  They are recommending negative COVID test, explained to daughter that it is not recommended to repeat for 20-month as COVID can remain positive for 33-month but patient will not be infectious.  1/5: Hypoglycemic this morning so holding somatically.  Hopefully can go tomorrow as daughter is trying to arrange home health with a different company and hoping they can start on Monday. Lost IV-difficult stick.  Currently keeping without IV for now  Assessment and Plan:  COVID-19 infection  Reported to have acute respiratory failure but O2 sats are not documented <90% Daughter reported that the patient had several sick contacts during the holiday season COVID-19 positive on admission and again on 1/2 Predominant COVID symptoms appear to have been encephalopathy and generalized weakness He is currently comfortable on RA COVID isolation  Continue steroids but transition from Decadron  6 mg IV daily to 4 mg BID x 5 total days (through 1/2) He will complete his  10-day quarantine on 1/7 He may be able to return to his facility prior to the end of the 10 days, but daughter would have to arrange for an alternate [facility] agency    UTI (urinary tract infection) Patient recently had VRE-UTI He has been treated with cefuroxime  for Proteus mirabilis UTI recently Initial urinalysis done on 12/26 positive for leukocytes but negative for bacteria  Urine culture now growing Morganella Morgagni Continue ciprofloxacin  per sensitivities but will transition to PO for a total of 7 days (through 1/4)   History of CVA (cerebrovascular accident) Continue Crestor  and Plavix  Imaging survey negative for acute CVA   Hyperlipidemia associated with type 2 diabetes mellitus (HCC) Continue Crestor    HTN (hypertension) IV hydralazine  as needed   Type II diabetes mellitus with renal manifestations Recent A1c 6.7, well-controlled - his A1c goal is <8 Patient taking NovoLog , semaglutide , Tresiba  80 units daily Decrease Semglee  to 20 units daily-holding today as CBG was 46 this morning. Sensitive sliding scale Avoid hypoglycemia Continue gabapentin    Chronic diastolic CHF (congestive heart failure)  2D echo on 01/02/2022 showed EF of 55-60% CHF seem to be compensated   Chronic kidney disease, stage 3b  Renal function stable Recent baseline creatinine 1.4-1.9   Hypothyroidism Continue Synthroid    Restless leg syndrome, severe Continue Pramipexole    Right leg swelling Lower extremity venous Doppler negative for VTE   BPH with obstruction/lower urinary tract symptoms Continue tamsulosin    OSA Continue CPAP nightly   Glaucoma Continue Combigan    Mood d/o Continue duloxetine    Obesity (BMI 30-39.9) Body mass index is 30.03  kg/m..  Weight loss should be encouraged Resume semaglutide  as an outpatient Complicates overall care and prognosis   DNR Patient confirmed to be DNR He will need an out of facility DNR form at the time of discharge    Consultants: Palliative care PT OT [facility] team   Procedures: None   Antibiotics: Unasyn  12/27-29 Ciprofloxacin  12/29-1/4   30 Day Unplanned Readmission Risk Score    Flowsheet Row ED to Hosp-Admission (Current) from 09[facility]13/2023 in [facility] PCU  30 Day Unplanned Readmission Risk Score (%) 24.98 Filed at 09/21/2023 0801       This score is the patient's risk of an unplanned readmission within 30 days of being discharged (0 -100%). The score is based on dignosis, age, lab data, medications, orders, and past utilization.   Low:  0-14.9   Medium: 15-21.9   High: 22-29.9   Extreme: 30 and above          Subjective: Patient was eating breakfast when seen today.  No new concern.   Objective: Vitals:   09/23/23 0928 09/23/23 1306  BP: 114/67 (!) 132/58  Pulse: 65 67  Resp: 16 16  Temp: 97.6 F (36.4 C) (!) 97.5 F (36.4 C)  SpO2: 100% 99%    Intake/Output Summary (Last 24 hours) at 09/23/2023 1443 Last data filed at 09/22/2023 1657 Gross per 24 hour  Intake --  Output 450 ml  Net -450 ml   Filed Weights   09/14/23 1200 09/15/23 0500  Weight: 90 kg 89.6 kg    Exam:  General.  Frail elderly man, in no acute distress. Pulmonary.  Lungs clear bilaterally, normal respiratory effort. CV.  Regular rate and rhythm, no JVD, rub or murmur. Abdomen.  Soft, nontender, nondistended, BS positive. CNS.  Alert and oriented .  No focal neurologic deficit. Extremities.  No edema, no cyanosis, pulses intact and symmetrical.   Data Reviewed: Prior data reviewed.   Family Communication: Talked with daughter on phone.  Disposition: Status is: Inpatient Remains inpatient appropriate because: ongoing COVID isolation  DVT prophylaxis: Lovenox  Time spent: 45 minutes  Unresulted Labs (From admission, onward)    None        Author: Amaryllis Dare, James Bell 09/23/2023 2:43 PM  For on call review [email or web address].

## 2023-09-23 NOTE — TOC Progression Note (Signed)
 Transition of Care [facility]) - Progression Note    Patient Details  Name: James MACLELLAN, MD MRN: [id number] Date of Birth: 10/20/1930  [facility] CM/SW Contact  Racheal LITTIE Schimke, RN Phone Number: 09/23/2023, 3:55 PM  Clinical Narrative:  Daughter [place] called, [phone] to report that patient will return back to [facility] with 24/7 live in care provided by [facility], Quinton Qocher contact [phone]. Amada will need copy of discharge summary. [facility], who says that they will be able to get copy of discharge summary from the patient. [facility] Hur[facility] will provide transportation and prefer to pick up early on Tuesday, 09/25/23 before 9am. Daughter informed that CM will call [facility] Huron once MD completes discharge orders. MD will be notified of the need for early discharge orders if medically stable.     Expected Discharge Plan: Home w Home Health Services Barriers to Discharge: Continued Medical Work up  Expected Discharge Plan and Services     Post Acute Care Choice: Home Health Living arrangements for the past 2 months: Independent Living Facility                                       Social Determinants of Health (SDOH) Interventions SDOH Screenings   Food Insecurity: No Food Insecurity (09/14/2023)  Housing: Low Risk  (09/14/2023)  Transportation Needs: No Transportation Needs (09/14/2023)  Utilities: Not At Risk (09/14/2023)  Depression (PHQ2-9): Low Risk  (05/10/2023)  Financial Resource Strain: Low Risk  (12/30/2020)  Social Connections: Unknown (09/18/2023)  Tobacco Use: Low Risk  (09/14/2023)    Readmission Risk Interventions     No data to display

## 2023-09-23 NOTE — Progress Notes (Signed)
 Physical Therapy Treatment Patient Details Name: James CRYSTAL, MD MRN: [id number] DOB: 19-Mar-1931 Today's Date: 09/23/2023   History of Present Illness Pt is a 89 year old male presenting to ED with weakness, difficulty speaking, confusion; work up includes UTI, acute metabolic encephalopathy, and Covid+.  PMH significant for HLD, DM, dCHF, stroke,/TIA, hypothyroidism, memory loss, dementia, BPH, CKD-3B, obesity, OSA on CPAP, RLS, migraine headache, C. difficile, recent admission due to VRE-UTI. T/f to PCU 12/30.    PT Comments  Pt up in chair, reports being up for several hours.  He has slid down in chair but is able to reposition himself with cues.  Stands and transfers to bed with mod a x 1 going to right and min a x 1 back to chair going to left which is expected as he as R weakness from stroke.  He is able to do seated AROM in chair then stands x 2 using bed rails on elevated bed for support and remain standing for several minutes before fatigue.  Opts to return to bed after session and transitions to supine with supervision.  Overall tolerates activity well.  Will need +1 assist for transfers upon discharge.     If plan is discharge home, recommend the following: A lot of help with walking and/or transfers;Assist for transportation;A lot of help with bathing/dressing/bathroom;Help with stairs or ramp for entrance   Can travel by private vehicle        Equipment Recommendations  None recommended by PT    Recommendations for Other Services       Precautions / Restrictions Precautions Precautions: Fall Restrictions Weight Bearing Restrictions Per Provider Order: No     Mobility  Bed Mobility Overal bed mobility: Needs Assistance Bed Mobility: Sit to Supine       Sit to supine: Supervision, HOB elevated     Patient Response: Cooperative  Transfers Overall transfer level: Needs assistance Equipment used: 1 person hand held assist   Sit to Stand: Contact guard  assist, Min assist Stand pivot transfers: Min assist              Ambulation/Gait               General Gait Details: unable to ambulate at baseline   Stairs             Wheelchair Mobility     Tilt Bed Tilt Bed Patient Response: Cooperative  Modified Rankin (Stroke Patients Only)       Balance Overall balance assessment: Needs assistance Sitting-balance support: No upper extremity supported, Feet supported Sitting balance-Leahy Scale: Good     Standing balance support: Bilateral upper extremity supported Standing balance-Leahy Scale: Fair Standing balance comment: static standing holding onto bed rails with no assist needed,  when HHA increased min A for transfers                            Cognition Arousal: Alert Behavior During Therapy: WFL for tasks assessed/performed Overall Cognitive Status: Within Functional Limits for tasks assessed                                 General Comments: soft voice but overall no issues        Exercises Other Exercises Other Exercises: seated AROM and pre-gait weight shifting in standing with bed rail support    General Comments  Pertinent Vitals/Pain Pain Assessment Pain Assessment: No/denies pain    Home Living                          Prior Function            PT Goals (current goals can now be found in the care plan section) Progress towards PT goals: Progressing toward goals    Frequency    Min 1X/week      PT Plan      Co-evaluation              AM-PAC PT 6 Clicks Mobility   Outcome Measure  Help needed turning from your back to your side while in a flat bed without using bedrails?: A Little Help needed moving from lying on your back to sitting on the side of a flat bed without using bedrails?: A Little Help needed moving to and from a bed to a chair (including a wheelchair)?: A Little Help needed standing up from a chair using  your arms (e.g., wheelchair or bedside chair)?: A Lot Help needed to walk in hospital room?: Total Help needed climbing 3-5 steps with a railing? : Total 6 Click Score: 13    End of Session Equipment Utilized During Treatment: Gait belt Activity Tolerance: Patient tolerated treatment well Patient left: in bed;with call bell/phone within reach;with chair alarm set Nurse Communication: Mobility status PT Visit Diagnosis: Other abnormalities of gait and mobility (R26.89);Muscle weakness (generalized) (M62.81);Unsteadiness on feet (R26.81);Difficulty in walking, not elsewhere classified (R26.2) Hemiplegia - Right/Left: Right     Time: 1434-1500 PT Time Calculation (min) (ACUTE ONLY): 26 min  Charges:    $Therapeutic Exercise: 8-22 mins $Therapeutic Activity: 8-22 mins PT General Charges $$ ACUTE PT VISIT: 1 Visit                    Lauraine Gills, PTA 09/23/23, 3:07 PM

## 2023-09-24 DIAGNOSIS — E039 Hypothyroidism, unspecified: Secondary | ICD-10-CM | POA: Diagnosis not present

## 2023-09-24 DIAGNOSIS — I5032 Chronic diastolic (congestive) heart failure: Secondary | ICD-10-CM | POA: Diagnosis not present

## 2023-09-24 DIAGNOSIS — Z8673 Personal history of transient ischemic attack (TIA), and cerebral infarction without residual deficits: Secondary | ICD-10-CM | POA: Diagnosis not present

## 2023-09-24 DIAGNOSIS — N3000 Acute cystitis without hematuria: Secondary | ICD-10-CM | POA: Diagnosis not present

## 2023-09-24 LAB — GLUCOSE, CAPILLARY
Glucose-Capillary: 106 mg/dL — ABNORMAL HIGH (ref 70–99)
Glucose-Capillary: 209 mg/dL — ABNORMAL HIGH (ref 70–99)
Glucose-Capillary: 147 mg/dL — ABNORMAL HIGH (ref 70–99)
Glucose-Capillary: 232 mg/dL — ABNORMAL HIGH (ref 70–99)

## 2023-09-24 MED ORDER — INSULIN GLARGINE-YFGN 100 UNIT/ML ~~LOC~~ SOLN
10.0000 [IU] | Freq: Every day | SUBCUTANEOUS | Status: DC
  Administered 2023-09-24 – 2023-09-25 (×2): 10 [IU] via SUBCUTANEOUS
  Filled 2023-09-24 (×2): qty 0.1

## 2023-09-24 NOTE — Plan of Care (Signed)
  Problem: Education: Goal: Ability to describe self-care measures that may prevent or decrease complications (Diabetes Survival Skills Education) will improve Outcome: Progressing Goal: Individualized Educational Video(s) Outcome: Progressing   Problem: Coping: Goal: Ability to adjust to condition or change in health will improve Outcome: Progressing   Problem: Fluid Volume: Goal: Ability to maintain a balanced intake and output will improve Outcome: Progressing   Problem: Health Behavior/Discharge Planning: Goal: Ability to identify and utilize available resources and services will improve Outcome: Progressing Goal: Ability to manage health-related needs will improve Outcome: Progressing   Problem: Metabolic: Goal: Ability to maintain appropriate glucose levels will improve Outcome: Progressing   Problem: Nutritional: Goal: Maintenance of adequate nutrition will improve Outcome: Progressing Goal: Progress toward achieving an optimal weight will improve Outcome: Progressing   Problem: Skin Integrity: Goal: Risk for impaired skin integrity will decrease Outcome: Progressing   Problem: Tissue Perfusion: Goal: Adequacy of tissue perfusion will improve Outcome: Progressing   Problem: Education: Goal: Knowledge of General Education information will improve Description: Including pain rating scale, medication(s)/side effects and non-pharmacologic comfort measures Outcome: Progressing   Problem: Health Behavior/Discharge Planning: Goal: Ability to manage health-related needs will improve Outcome: Progressing   Problem: Clinical Measurements: Goal: Ability to maintain clinical measurements within normal limits will improve Outcome: Progressing Goal: Will remain free from infection Outcome: Progressing Goal: Diagnostic test results will improve Outcome: Progressing Goal: Respiratory complications will improve Outcome: Progressing Goal: Cardiovascular complication will  be avoided Outcome: Progressing   Problem: Activity: Goal: Risk for activity intolerance will decrease Outcome: Progressing   Problem: Nutrition: Goal: Adequate nutrition will be maintained Outcome: Progressing   Problem: Coping: Goal: Level of anxiety will decrease Outcome: Progressing   Problem: Elimination: Goal: Will not experience complications related to bowel motility Outcome: Progressing Goal: Will not experience complications related to urinary retention Outcome: Progressing   Problem: Pain Management: Goal: General experience of comfort will improve Outcome: Progressing   Problem: Safety: Goal: Ability to remain free from injury will improve Outcome: Progressing   Problem: Skin Integrity: Goal: Risk for impaired skin integrity will decrease Outcome: Progressing   Problem: Education: Goal: Knowledge of risk factors and measures for prevention of condition will improve Outcome: Progressing   Problem: Coping: Goal: Psychosocial and spiritual needs will be supported Outcome: Progressing   Problem: Respiratory: Goal: Will maintain a patent airway Outcome: Progressing Goal: Complications related to the disease process, condition or treatment will be avoided or minimized Outcome: Progressing

## 2023-09-24 NOTE — Progress Notes (Signed)
 Couple Progress Note   Patient: James BURNSWORTH, MD FMW:[id number] DOB: February 17, 1931 DOA: 09/13/2023     8 DOS: the patient was seen and examined on 09/24/2023   Brief hospital course: 89yo with h/o HLD, DM, chronic diastolic CHF, CVA, hypothyroidism, dementia, BPH, stage 3b CKD, OSA on CPAP, and recent admission for VRE UTI who presented on 12/26 with lethargy.  Initial concern for CVA, negative MRI/CTA.  UA positive for Proteus mirabilis UTI on 12/20, started on cefuroxime  by urology.  He was found to have COVID with encephalopathy and acute hypoxic respiratory failure, treated with Decadron .    1/4: Vital stable.  Slight worsening of creatinine but remained within baseline. Patient with need to go to SNF, quarantine on 09/25/2023. Will complete antibiotics for UTI today. PT is recommending SNF but family is trying to arrange 24/7 care at his IL[facility] as patient does not want to go to rehab place.  Having difficulty with his positive COVID status.  They are recommending negative COVID test, explained to daughter that it is not recommended to repeat for 71-month as COVID can remain positive for 66-month but patient will not be infectious.  1/5: Hypoglycemic this morning so holding somatically.  Hopefully can go tomorrow as daughter is trying to arrange home health with a different company and hoping they can start on Monday. Lost IV-difficult stick.  Currently keeping without IV for now  1/6: Hemodynamically stable.  CBG within goal so Semglee  dose was decreased to 10 units from 20.  Will complete the quarantine time tomorrow, daughter is requesting discharge early morning tomorrow.  Assessment and Plan:  COVID-19 infection  Reported to have acute respiratory failure but O2 sats are not documented <90% Daughter reported that the patient had several sick contacts during the holiday season COVID-19 positive on admission and again on 1/2 Predominant COVID symptoms appear to have been encephalopathy and  generalized weakness He is currently comfortable on RA COVID isolation  Continue steroids but transition from Decadron  6 mg IV daily to 4 mg BID x 5 total days (through 1/2) He will complete his 10-day quarantine on 1/7 He may be able to return to his facility prior to the end of the 10 days, but daughter would have to arrange for an alternate [facility] agency    UTI (urinary tract infection) Patient recently had VRE-UTI He has been treated with cefuroxime  for Proteus mirabilis UTI recently Initial urinalysis done on 12/26 positive for leukocytes but negative for bacteria  Urine culture now growing Morganella Morgagni Continue ciprofloxacin  per sensitivities but will transition to PO for a total of 7 days (through 1/4)   History of CVA (cerebrovascular accident) Continue Crestor  and Plavix  Imaging survey negative for acute CVA   Hyperlipidemia associated with type 2 diabetes mellitus (HCC) Continue Crestor    HTN (hypertension) IV hydralazine  as needed   Type II diabetes mellitus with renal manifestations Recent A1c 6.7, well-controlled - his A1c goal is <8 Patient taking NovoLog , semaglutide , Tresiba  80 units daily Decrease Semglee  to 10 units daily- Sensitive sliding scale Avoid hypoglycemia Continue gabapentin    Chronic diastolic CHF (congestive heart failure)  2D echo on 01/02/2022 showed EF of 55-60% CHF seem to be compensated   Chronic kidney disease, stage 3b  Renal function stable Recent baseline creatinine 1.4-1.9   Hypothyroidism Continue Synthroid    Restless leg syndrome, severe Continue Pramipexole    Right leg swelling Lower extremity venous Doppler negative for VTE   BPH with obstruction/lower urinary tract symptoms Continue tamsulosin   OSA Continue CPAP nightly   Glaucoma Continue Combigan    Mood d/o Continue duloxetine    Obesity (BMI 30-39.9) Body mass index is 30.03 kg/m.SABRA  Weight loss should be encouraged Resume semaglutide  as an  outpatient Complicates overall care and prognosis   DNR Patient confirmed to be DNR He will need an out of facility DNR form at the time of discharge   Consultants: Palliative care PT OT [facility] team   Procedures: None   Antibiotics: Unasyn  12/27-29 Ciprofloxacin  12/29-1/4   30 Day Unplanned Readmission Risk Score    Flowsheet Row ED to Hosp-Admission (Current) from 09/13/2023 in [facility]  30 Day Unplanned Readmission Risk Score (%) 24.98 Filed at 09/21/2023 0801       This score is the patient's risk of an unplanned readmission within 30 days of being discharged (0 -100%). The score is based on dignosis, age, lab data, medications, orders, and past utilization.   Low:  0-14.9   Medium: 15-21.9   High: 22-29.9   Extreme: 30 and above          Subjective: Patient was seen and examined today.  No new concern.   Objective: Vitals:   09/24/23 0940 09/24/23 1233  BP: (!) 150/78 (!) 151/64  Pulse: 80 66  Resp: 18 16  Temp: 97.8 F (36.6 C) (!) 97.5 F (36.4 C)  SpO2: 97% 99%    Intake/Output Summary (Last 24 hours) at 09/24/2023 1445 Last data filed at 09/24/2023 0454 Gross per 24 hour  Intake --  Output 1200 ml  Net -1200 ml   Filed Weights   09/14/23 1200 09/15/23 0500 09/24/23 0451  Weight: 90 kg 89.6 kg 76.3 kg    Exam:  General.  Frail elderly man, in no acute distress. Pulmonary.  Lungs clear bilaterally, normal respiratory effort. CV.  Regular rate and rhythm, no JVD, rub or murmur. Abdomen.  Soft, nontender, nondistended, BS positive. CNS.  Alert and oriented .  No focal neurologic deficit. Extremities.  No edema, no cyanosis, pulses intact and symmetrical.  Data Reviewed: Prior data reviewed.   Family Communication:   Disposition: Status is: Inpatient Remains inpatient appropriate because: ongoing COVID isolation  DVT prophylaxis: Lovenox  Time spent: 42 minutes  Unresulted Labs (From admission, onward)    None         Author: Amaryllis Dare, MD 09/24/2023 2:45 PM  For on call review [email or web address].

## 2023-09-24 NOTE — Inpatient Diabetes Management (Signed)
 Inpatient Diabetes Program Recommendations  AACE/ADA: New Consensus Statement on Inpatient Glycemic Control   Target Ranges:  Prepandial:   less than 140 mg/dL      Peak postprandial:   less than 180 mg/dL (1-2 hours)      Critically ill patients:  140 - 180 mg/dL    Latest Reference Range & Units 09/23/23 08:46 09/23/23 09:30 09/23/23 13:13 09/23/23 17:52 09/23/23 22:15  Glucose-Capillary 70 - 99 mg/dL 56 (L) 76 816 (H) 843 (H) 133 (H)   Review of Glycemic Control  Diabetes history: DM2 Outpatient Diabetes medications: Tresiba  90 units daily, Novolog  0-9 units TID with melas, Ozempic  1 mg Qweek Current orders for Inpatient glycemic control: Semglee  20 units daily, Novolog  0-9 units TID with meals, Novolog  0-5 units QHS  Inpatient Diabetes Program Recommendations:    Insulin :  CBG 56 mg/dl yesterday morning fasting. No Semglee  given on 09/23/23 (last received Semglee  20 units on 09/22/23 at 9:57 am). Please consider decreasing Semglee  to 7 units daily.  Thanks, Earnie Gainer, RN, MSN, CDCES Diabetes Coordinator Inpatient Diabetes Program [phone] (Team Pager from 8am to 5pm)

## 2023-09-25 ENCOUNTER — Other Ambulatory Visit: Payer: Self-pay | Admitting: Family Medicine

## 2023-09-25 DIAGNOSIS — Z8673 Personal history of transient ischemic attack (TIA), and cerebral infarction without residual deficits: Secondary | ICD-10-CM | POA: Diagnosis not present

## 2023-09-25 DIAGNOSIS — N3000 Acute cystitis without hematuria: Secondary | ICD-10-CM | POA: Diagnosis not present

## 2023-09-25 DIAGNOSIS — G9341 Metabolic encephalopathy: Secondary | ICD-10-CM | POA: Diagnosis not present

## 2023-09-25 DIAGNOSIS — R531 Weakness: Secondary | ICD-10-CM | POA: Diagnosis not present

## 2023-09-25 DIAGNOSIS — F331 Major depressive disorder, recurrent, moderate: Secondary | ICD-10-CM

## 2023-09-25 MED ORDER — TRESIBA FLEXTOUCH 200 UNIT/ML ~~LOC~~ SOPN: 20.0000 [IU] | PEN_INJECTOR | Freq: Every day | SUBCUTANEOUS | Status: DC

## 2023-09-25 NOTE — Discharge Summary (Signed)
 Physician Discharge Summary   Patient: James ISBELL, MD MRN: [id number] DOB: [birth year]  Admit date:     09/13/2023  Discharge date: 09/25/23  Discharge Physician: Amaryllis Dare   PCP: Rilla Baller, MD   Recommendations at discharge:  Please obtain CBC and BMP on follow-up We did creased the home dose of Tresiba  due to concern of hypoglycemia while he was on Semglee  20 units, please monitor and titrate accordingly. Follow-up with primary care provider within a week  Discharge Diagnoses: Principal Problem:   UTI (urinary tract infection) Active Problems:   History of CVA (cerebrovascular accident)   Acute metabolic encephalopathy   Hyperlipidemia associated with type 2 diabetes mellitus (HCC)   HTN (hypertension)   Generalized weakness   Type II diabetes mellitus with renal manifestations (HCC)   Chronic diastolic CHF (congestive heart failure) (HCC)   Chronic kidney disease, stage 3b (HCC)   Hypothyroidism   Restless leg syndrome, severe   Right leg swelling   BPH with obstruction/lower urinary tract symptoms   OSA on CPAP   Obesity (BMI 30-39.9)   COVID-19  Resolved Problems:   * No resolved hospital problems. [facility] Course: 89yo with h/o HLD, DM, chronic diastolic CHF, CVA, hypothyroidism, dementia, BPH, stage 3b CKD, OSA on CPAP, and recent admission for VRE UTI who presented on 12/26 with lethargy.  Initial concern for CVA, negative MRI/CTA.  UA positive for Proteus mirabilis UTI on 12/20, started on cefuroxime  by urology.  He was found to have COVID with encephalopathy and acute hypoxic respiratory failure, treated with Decadron .    1/4: Vital stable.  Slight worsening of creatinine but remained within baseline. Patient with need to go to SNF, quarantine on 09/25/2023. Will complete antibiotics for UTI today. PT is recommending SNF but family is trying to arrange 24/7 care at his ILF as patient does not want to go to rehab place.  Having difficulty with  his positive COVID status.  They are recommending negative COVID test, explained to daughter that it is not recommended to repeat for 50-month as COVID can remain positive for 96-month but patient will not be infectious.  1/5: Hypoglycemic this morning so holding somatically.  Hopefully can go tomorrow as daughter is trying to arrange home health with a different company and hoping they can start on Monday. Lost IV-difficult stick.  Currently keeping without IV for now  1/6: Hemodynamically stable.  CBG within goal so Semglee  dose was decreased to 10 units from 20.  Will complete the quarantine time tomorrow, daughter is requesting discharge early morning tomorrow.  1/7: Patient remained hemodynamically stable.  CBG now trending up. Patient was on Tresiba  90 units with SSI at home.  With SSI at home.  He was advised to decrease that to 20 units and monitor his blood glucose level carefully and uptitrate as needed.  Patient will continue with rest of his home medications and need to have a close follow-up with his primary care provider for further assistance.   Consultants: None Procedures performed: None Disposition: Assisted living Diet recommendation:  Discharge Diet Orders (From admission, onward)     Start     Ordered   09/25/23 0000  Diet - low sodium heart healthy        09/25/23 0953           Cardiac and Carb modified diet DISCHARGE MEDICATION: Allergies as of 09/25/2023       Reactions   Dulaglutide    Other reaction(s): upset stomach  Other reaction(s): Abdominal Pain, upset stomach Other reaction(s): upset stomach   Empagliflozin Other (See Comments)   Other reaction(s): yeast infection Other reaction(s): Other (See Comments) Other reaction(s): yeast infection   Lisinopril  Cough   tachycardia   Nexium  [esomeprazole ] Other (See Comments)   Headache   Oxycodone  Other (See Comments)   Even 1/2 tablet of 5mg  caused increased confusion - contributory to hospitalization  10/2020 with AMS   Pregabalin Nausea Only, Other (See Comments)   Other reaction(s): MALAISE, SLIGHT NAUSEA, SLIGHT HEADACHE Other reaction(s): MALAISE, SLIGHT NAUSEA, SLIGHT HEADACHE   Rosuvastatin  Other (See Comments)   Other reaction(s): muscle pain 2.16.2022 Pt is current on this medication without any issues. Other reaction(s): muscle pain 2.16.2022 Pt is current on this medication without any issues. Other reaction(s): muscle pain        Medication List     TAKE these medications    Alpha-Lipoic Acid 600 MG Caps Take 1 capsule (600 mg total) by mouth daily.   ascorbic acid  500 MG tablet Commonly known as: VITAMIN C Take 1,000 mg by mouth daily as needed.   brimonidine -timolol  0.2-0.5 % ophthalmic solution Commonly known as: COMBIGAN  Place 1 drop into both eyes every 12 (twelve) hours.   clopidogrel  75 MG tablet Commonly known as: PLAVIX  TAKE 1 TABLET DAILY   COQ10 150 MG Caps Take 1 capsule by mouth daily.   DULoxetine  60 MG capsule Commonly known as: CYMBALTA  TAKE 1 CAPSULE DAILY What changed: when to take this   famotidine  20 MG tablet Commonly known as: Pepcid  Take 1 tablet (20 mg total) by mouth at bedtime.   gabapentin  300 MG capsule Commonly known as: NEURONTIN  Take 2 capsules (600 mg total) by mouth 3 (three) times daily. What changed: additional instructions   insulin  aspart 100 UNIT/ML injection Commonly known as: novoLOG  CBG 70 - 120: 0 units  CBG 121 - 150: 1 unit  CBG 151 - 200: 2 units  CBG 201 - 250: 3 units  CBG 251 - 300: 5 units  CBG 301 - 350: 7 units  CBG 351 - 400: 9 units   levothyroxine  50 MCG tablet Commonly known as: SYNTHROID  Take 1 tablet (50 mcg total) by mouth daily before breakfast. Per endo Dr Faythe   nystatin  cream Commonly known as: MYCOSTATIN  Apply 1 Application topically 2 (two) times daily. Apply to affected area   ondansetron  4 MG tablet Commonly known as: ZOFRAN  Take 1 tablet (4 mg total) by mouth daily as  needed for nausea or vomiting.   pramipexole  1 MG tablet Commonly known as: MIRAPEX  TAKE 1 TABLET DAILY AT 4 P.M.   PROBIOTIC PO Take by mouth in the morning and at bedtime. Takes 2 capsules twice a day   RABEprazole  20 MG tablet Commonly known as: Aciphex  Take 1 tablet (20 mg total) by mouth daily.   rosuvastatin  40 MG tablet Commonly known as: CRESTOR  TAKE 1 TABLET DAILY What changed: when to take this   Semaglutide  (1 MG/DOSE) 4 MG/3ML Sopn Inject 1 mg into the skin once a week.   sennosides-docusate sodium  8.6-50 MG tablet Commonly known as: SENOKOT-S Take 1 tablet by mouth daily as needed for constipation.   sodium chloride  5 % ophthalmic solution Commonly known as: MURO 128 1 drop as needed for eye irritation.   tamsulosin  0.4 MG Caps capsule Commonly known as: FLOMAX  Take 1 capsule (0.4 mg total) by mouth daily after supper.   Tresiba  FlexTouch 200 UNIT/ML FlexTouch Pen Generic drug: insulin  degludec Inject  20 Units into the skin daily. What changed: how much to take   UltiCare Short Pen Needles 31G X 8 MM Misc Generic drug: Insulin  Pen Needle USE TO INJECT MEDICATION DAILY        Follow-up Information     Rilla Baller, MD. Schedule an appointment as soon as possible for a visit in 1 week(s).   Specialty: Family Medicine Contact information: [address] [place] [address] [phone]                Discharge Exam: Fredricka Weights   09/14/23 1200 09/15/23 0500 09/24/23 0451  Weight: 90 kg 89.6 kg 76.3 kg   General.  Frail elderly man, in no acute distress. Pulmonary.  Lungs clear bilaterally, normal respiratory effort. CV.  Regular rate and rhythm, no JVD, rub or murmur. Abdomen.  Soft, nontender, nondistended, BS positive. CNS.  Alert and oriented .  No focal neurologic deficit. Extremities.  No edema, no cyanosis, pulses intact and symmetrical. Psychiatry.  Judgment and insight appears normal.   Condition at  discharge: stable  The results of significant diagnostics from this hospitalization (including imaging, microbiology, ancillary and laboratory) are listed below for reference.   Imaging Studies: DG Chest Port 1 View Result Date: 09/15/2023 CLINICAL DATA:  Cough and COVID-19 positivity, initial encounter EXAM: PORTABLE CHEST 1 VIEW COMPARISON:  10/09/2022 FINDINGS: Cardiac shadow is within normal limits. Aortic calcifications are seen. The lungs are well aerated bilaterally. No focal infiltrate or effusion is seen. No bony abnormality is noted. IMPRESSION: No acute abnormality noted. Electronically Signed   By: Oneil Devonshire M.D.   On: 09/15/2023 18:58   US  Venous Img Lower Unilateral Right (DVT) Result Date: 09/14/2023 CLINICAL DATA:  Right leg swelling EXAM: RIGHT LOWER EXTREMITY VENOUS DOPPLER ULTRASOUND TECHNIQUE: Gray-scale sonography with compression, as well as color and duplex ultrasound, were performed to evaluate the deep venous system(s) from the level of the common femoral vein through the popliteal and proximal calf veins. COMPARISON:  None Available. FINDINGS: VENOUS Normal compressibility of the common femoral, superficial femoral, and popliteal veins, as well as the visualized calf veins. Visualized portions of profunda femoral vein and great saphenous vein unremarkable. No filling defects to suggest DVT on grayscale or color Doppler imaging. Doppler waveforms show normal direction of venous flow, normal respiratory plasticity and response to augmentation. Limited views of the contralateral common femoral vein are unremarkable. OTHER None. Limitations: none IMPRESSION: Negative. Electronically Signed   By: Franky Crease M.D.   On: 09/14/2023 03:07   MR BRAIN WO CONTRAST Result Date: 09/14/2023 CLINICAL DATA:  Generalized weakness, speech changes EXAM: MRI HEAD WITHOUT CONTRAST TECHNIQUE: Multiplanar, multiecho pulse sequences of the brain and surrounding structures were obtained without  intravenous contrast. COMPARISON:  MRI head 06/12/2023, CT head 09/13/2023 FINDINGS: Evaluation is limited by motion affecting several sequences. Brain: No restricted diffusion to suggest acute or subacute infarct. No acute hemorrhage, mass, mass effect, or midline shift. No hydrocephalus or extra-axial collection. Pituitary and craniocervical junction within normal limits. Hemosiderin deposition is associated with a remote lacunar infarct in the left posterior lentiform nucleus. No other hemosiderin deposition. Confluent T2 hyperintense signal in the periventricular white matter and pons, likely the sequela of moderate chronic small vessel ischemic disease. Vascular: Normal arterial flow voids. Skull and upper cervical spine: Normal marrow signal. Sinuses/Orbits: Clear paranasal sinuses. Status post bilateral lens replacements. Other: The mastoid air cells are well aerated. IMPRESSION: No acute intracranial process. No evidence of acute or  subacute infarct. Electronically Signed   By: Donald Campion M.D.   On: 09/14/2023 00:41   CT ANGIO HEAD NECK W WO CM W PERF Result Date: 09/13/2023 CLINICAL DATA:  Stroke suspected, speech changes EXAM: CT ANGIOGRAPHY HEAD AND NECK CT PERFUSION BRAIN TECHNIQUE: Multidetector CT imaging of the head and neck was performed using the standard protocol during bolus administration of intravenous contrast. Multiplanar CT image reconstructions and MIPs were obtained to evaluate the vascular anatomy. Carotid stenosis measurements (when applicable) are obtained utilizing NASCET criteria, using the distal internal carotid diameter as the denominator. Multiphase CT imaging of the brain was performed following IV bolus contrast injection. Subsequent parametric perfusion maps were calculated using RAPID software. RADIATION DOSE REDUCTION: This exam was performed according to the departmental dose-optimization program which includes automated exposure control, adjustment of the mA and/or kV  according to patient size and/or use of iterative reconstruction technique. CONTRAST:  OMNIPAQUE  IOHEXOL  350 MG/ML SOLN COMPARISON:  09/13/2023 CT head FINDINGS: CT HEAD FINDINGS For noncontrast findings, please see same day CT head. CTA NECK FINDINGS Aortic arch: Standard branching. Imaged portion shows no evidence of aneurysm or dissection. No significant stenosis of the major arch vessel origins. Mild aortic atherosclerosis. Right carotid system: No evidence of dissection, occlusion, or hemodynamically significant stenosis (greater than 50%). Left carotid system: No evidence of dissection, occlusion, or hemodynamically significant stenosis (greater than 50%). Vertebral arteries: No evidence of dissection, occlusion, or hemodynamically significant stenosis (greater than 50%). With calcifications but Skeleton: No acute osseous abnormality. Degenerative changes in the cervical spine. Other neck: No acute finding. Upper chest: No focal pulmonary opacity or pleural effusion. Review of the MIP images confirms the above findings CTA HEAD FINDINGS Anterior circulation: Both internal carotid arteries are patent to the termini, without significant stenosis. A1 segments patent. Normal anterior communicating artery. Anterior cerebral arteries are patent to their distal aspects without significant stenosis. Mild stenosis in the proximal left M1 (series 6, image 95). No significant stenosis in the right M1. In MCA branches perfused to their distal aspects without significant stenosis. Posterior circulation: Vertebral arteries patent to the vertebrobasilar junction without significant stenosis. Posterior inferior cerebellar arteries patent proximally. Basilar patent to its distal aspect without significant stenosis. Superior cerebellar arteries patent proximally. Mild stenosis in the left P1 (series 6, image 90) and right P1 (series 6, image 89). Additional mild stenosis in the proximal right P 2 (series 6, image 91),  moderate to severe stenosis in the mid to distal right P2 (series 6, image 99), and severe stenosis in the more medial right P3 (series 6, image 105). Mild stenosis in the proximal left P 2 (series 6, image 101), and severe stenosis in the distal left P2 extending into left P3 (series 6, images 102-105). The bilateral posterior communicating arteries are not visualized. Venous sinuses: As permitted by contrast timing, patent. Anatomic variants: None significant. No evidence of aneurysm or vascular malformation. Review of the MIP images confirms the above findings CT Brain Perfusion Findings: CBF (<30%) Volume: 0mL Perfusion (Tmax>6.0s) volume: 0mL Mismatch Volume: 0mL Infarction Location:None IMPRESSION: 1. No intracranial large vessel occlusion. 2. Multifocal stenosis in the bilateral PCAs, some of which is severe. 3. Mild stenosis in the proximal left M1. 4. No hemodynamically significant stenosis in the neck. 5. No evidence of infarct core or penumbra on CT perfusion. 6. Aortic atherosclerosis. Aortic Atherosclerosis (ICD10-I70.0). Electronically Signed   By: Donald Campion M.D.   On: 09/13/2023 22:23   CT HEAD WO CONTRAST  Result Date: 09/13/2023 CLINICAL DATA:  Neurologic deficit.  Concern for stroke. EXAM: CT HEAD WITHOUT CONTRAST TECHNIQUE: Contiguous axial images were obtained from the base of the skull through the vertex without intravenous contrast. RADIATION DOSE REDUCTION: This exam was performed according to the departmental dose-optimization program which includes automated exposure control, adjustment of the mA and/or kV according to patient size and/or use of iterative reconstruction technique. COMPARISON:  Head CT dated 07/22/2023. FINDINGS: Brain: Mild age-related atrophy and moderate chronic microvascular ischemic changes. Left basal ganglia old lacunar infarct. There is no acute intracranial hemorrhage. No mass effect or midline shift. No extra-axial fluid collection. Vascular: No hyperdense  vessel or unexpected calcification. Skull: Normal. Negative for fracture or focal lesion. Sinuses/Orbits: The visualized paranasal sinuses and mastoid air cells are clear. Other: None IMPRESSION: 1. No acute intracranial pathology. 2. Mild age-related atrophy and moderate chronic microvascular ischemic changes. Left basal ganglia old lacunar infarct. Electronically Signed   By: Vanetta Chou M.D.   On: 09/13/2023 18:52    Microbiology: Results for orders placed or performed during the hospital encounter of 09/13/23  Urine Culture     Status: Abnormal   Collection Time: 09/13/23 11:22 PM   Specimen: Urine, Random  Result Value Ref Range Status   Specimen Description   Final    URINE, RANDOM Performed at [facility], [address] [place], [place] [address]    Special Requests   Final    NONE Reflexed from [id number] Performed at [facility], [address] [place], [place] [address]    Culture (A)  Final    50,000 COLONIES/mL MORGANELLA MORGANII 30,000 COLONIES/mL PROTEUS MIRABILIS    Report Status 09/16/2023 FINAL  Final   Organism ID, Bacteria MORGANELLA MORGANII (A)  Final   Organism ID, Bacteria PROTEUS MIRABILIS (A)  Final      Susceptibility   Morganella morganii - MIC*    AMPICILLIN  >=32 RESISTANT Resistant     CIPROFLOXACIN  <=0.25 SENSITIVE Sensitive     GENTAMICIN <=1 SENSITIVE Sensitive     IMIPENEM 4 SENSITIVE Sensitive     NITROFURANTOIN 128 RESISTANT Resistant     TRIMETH /SULFA  <=20 SENSITIVE Sensitive     AMPICILLIN /SULBACTAM 16 INTERMEDIATE Intermediate     PIP/TAZO <=4 SENSITIVE Sensitive ug/mL    * 50,000 COLONIES/mL MORGANELLA MORGANII   Proteus mirabilis - MIC*    AMPICILLIN  <=2 SENSITIVE Sensitive     CEFAZOLIN  8 SENSITIVE Sensitive     CEFEPIME  <=0.12 SENSITIVE Sensitive     CEFTRIAXONE  <=0.25 SENSITIVE Sensitive     CIPROFLOXACIN  <=0.25 SENSITIVE Sensitive     GENTAMICIN <=1 SENSITIVE Sensitive     IMIPENEM 4 SENSITIVE Sensitive      NITROFURANTOIN 128 RESISTANT Resistant     TRIMETH /SULFA  <=20 SENSITIVE Sensitive     AMPICILLIN /SULBACTAM <=2 SENSITIVE Sensitive     PIP/TAZO <=4 SENSITIVE Sensitive ug/mL    * 30,000 COLONIES/mL PROTEUS MIRABILIS  Urine Culture (for pregnant, neutropenic or urologic patients or patients with an indwelling urinary catheter)     Status: None   Collection Time: 09/15/23  2:14 PM   Specimen: Urine, Random  Result Value Ref Range Status   Specimen Description   Final    URINE, RANDOM Performed at [facility], [address] [place], [place] [address]    Special Requests   Final    NONE Performed at [facility], [address], [place], [place] [address]    Culture   Final  NO GROWTH Performed at [facility], [address] [place], [place] [address]    Report Status 09/17/2023 FINAL  Final  Respiratory (~20 pathogens) panel by PCR     Status: None   Collection Time: 09/15/23  3:27 PM   Specimen: Nasopharyngeal Swab; Respiratory  Result Value Ref Range Status   Adenovirus NOT DETECTED NOT DETECTED Final   Coronavirus 229E NOT DETECTED NOT DETECTED Final    Comment: (NOTE) The Coronavirus on the Respiratory Panel, DOES NOT test for the novel  Coronavirus (2019 nCoV)    Coronavirus HKU1 NOT DETECTED NOT DETECTED Final   Coronavirus NL63 NOT DETECTED NOT DETECTED Final   Coronavirus OC43 NOT DETECTED NOT DETECTED Final   Metapneumovirus NOT DETECTED NOT DETECTED Final   Rhinovirus / Enterovirus NOT DETECTED NOT DETECTED Final   Influenza A NOT DETECTED NOT DETECTED Final   Influenza B NOT DETECTED NOT DETECTED Final   Parainfluenza Virus 1 NOT DETECTED NOT DETECTED Final   Parainfluenza Virus 2 NOT DETECTED NOT DETECTED Final   Parainfluenza Virus 3 NOT DETECTED NOT DETECTED Final   Parainfluenza Virus 4 NOT DETECTED NOT DETECTED Final   Respiratory Syncytial Virus NOT DETECTED NOT DETECTED Final   Bordetella pertussis NOT DETECTED NOT  DETECTED Final   Bordetella Parapertussis NOT DETECTED NOT DETECTED Final   Chlamydophila pneumoniae NOT DETECTED NOT DETECTED Final   Mycoplasma pneumoniae NOT DETECTED NOT DETECTED Final    Comment: Performed at [facility], [address], [place], [place] [address]  SARS Coronavirus 2 by RT PCR (hospital order, performed in [facility] hospital lab) *cepheid single result test* Anterior Nasal Swab     Status: Abnormal   Collection Time: 09/15/23  3:27 PM   Specimen: Anterior Nasal Swab  Result Value Ref Range Status   SARS Coronavirus 2 by RT PCR POSITIVE (A) NEGATIVE Final    Comment: (NOTE) SARS-CoV-2 target nucleic acids are DETECTED  SARS-CoV-2 RNA is generally detectable in upper respiratory specimens  during the acute phase of infection.  Positive results are indicative  of the presence of the identified virus, but do not rule out bacterial infection or co-infection with other pathogens not detected by the test.  Clinical correlation with patient history and  other diagnostic information is necessary to determine patient infection status.  The expected result is negative.  Fact Sheet for Patients:   roadlaptop.co.za   Fact Sheet for Healthcare Providers:   http://kim-miller.com/    This test is not yet approved or cleared by the [place]  FDA and  has been authorized for detection and/or diagnosis of SARS-CoV-2 by FDA under an Emergency Use Authorization (EUA).  This EUA will remain in effect (meaning this test can be used) for the duration of  the COVID-19 declaration under Section 564(b)(1)  of the Act, 21 U.S.C. section 360-bbb-3(b)(1), unless the authorization is terminated or revoked sooner.   Performed at [facility], [address] [place], [place] [address]   SARS Coronavirus 2 by RT PCR (hospital order, performed in [facility] hospital lab) *cepheid single result test* Anterior Nasal Swab      Status: Abnormal   Collection Time: 09/20/23  4:39 AM   Specimen: Anterior Nasal Swab  Result Value Ref Range Status   SARS Coronavirus 2 by RT PCR POSITIVE (A) NEGATIVE Final    Comment: (NOTE) SARS-CoV-2 target nucleic acids are DETECTED  SARS-CoV-2 RNA is generally detectable in upper respiratory specimens  during the acute phase  of infection.  Positive results are indicative  of the presence of the identified virus, but do not rule out bacterial infection or co-infection with other pathogens not detected by the test.  Clinical correlation with patient history and  other diagnostic information is necessary to determine patient infection status.  The expected result is negative.  Fact Sheet for Patients:   roadlaptop.co.za   Fact Sheet for Healthcare Providers:   http://kim-miller.com/    This test is not yet approved or cleared by the [place]  FDA and  has been authorized for detection and/or diagnosis of SARS-CoV-2 by FDA under an Emergency Use Authorization (EUA).  This EUA will remain in effect (meaning this test can be used) for the duration of  the COVID-19 declaration under Section 564(b)(1)  of the Act, 21 U.S.C. section 360-bbb-3(b)(1), unless the authorization is terminated or revoked sooner.   Performed at [facility], [address] [place], [place] [address]    *Note: Due to a large number of results and/or encounters for the requested time period, some results have not been displayed. A complete set of results can be found in Results Review.    Labs: CBC: Recent Labs  Lab 09/19/23 0842 09/20/23 0605 09/21/23 0539 09/22/23 0436  WBC 7.2 8.3 9.1 10.2  NEUTROABS 5.3 6.3 6.6 6.5  HGB 12.9* 12.9* 13.1 13.4  HCT 37.1* 36.6* 38.8* 39.4  MCV 86.9 87.6 86.6 88.3  PLT 191 222 224 236   Basic Metabolic Panel: Recent Labs  Lab 09/19/23 0842 09/20/23 0605 09/21/23 0539 09/22/23 0436  NA 132*  132* 136 137  K 3.9 3.8 4.0 3.5  CL 103 100 105 106  CO2 21* 21* 21* 22  GLUCOSE 169* 157* 145* 153*  BUN 38* 42* 43* 43*  CREATININE 1.66* 1.75* 1.54* 1.70*  CALCIUM  7.9* 7.9* 8.1* 8.1*   Liver Function Tests: No results for input(s): AST, ALT, ALKPHOS, BILITOT, PROT, ALBUMIN in the last 168 hours. CBG: Recent Labs  Lab 09/23/23 2215 09/24/23 0921 09/24/23 1227 09/24/23 1714 09/24/23 2118  GLUCAP 133* 106* 209* 147* 232*    Discharge time spent: greater than 30 minutes.  This record has been created using Conservation officer, historic buildings. Errors have been sought and corrected,but may not always be located. Such creation errors do not reflect on the standard of care.   Signed: Amaryllis Dare, MD [facility] 09/25/2023

## 2023-09-25 NOTE — Progress Notes (Addendum)
 AVS printed and reviewed with patient. PIV removed. All questions answered. All of patients belongings packed in bags. Waiting on transportation from facility.

## 2023-09-26 ENCOUNTER — Telehealth: Payer: Self-pay

## 2023-09-26 DIAGNOSIS — R2689 Other abnormalities of gait and mobility: Secondary | ICD-10-CM | POA: Diagnosis not present

## 2023-09-26 DIAGNOSIS — R296 Repeated falls: Secondary | ICD-10-CM | POA: Diagnosis not present

## 2023-09-26 DIAGNOSIS — R2681 Unsteadiness on feet: Secondary | ICD-10-CM | POA: Diagnosis not present

## 2023-09-26 NOTE — Transitions of Care (Post Inpatient/ED Visit) (Signed)
 09/26/2023  Name: James ORSAK, MD MRN: [id number] DOB: 1931/07/02  Today's TOC FU Call Status: Today's TOC FU Call Status:: Successful TOC FU Call Completed TOC FU Call Complete Date: 09/26/23 Patient's Name and Date of Birth confirmed.  Transition Care Management Follow-up Telephone Call Date of Discharge: 09/25/23 Discharge Facility: [facility]) Type of Discharge: Inpatient Admission Primary Inpatient Discharge Diagnosis:: UTI and CHF How have you been since you were released from the hospital?: Better Any questions or concerns?: No  Items Reviewed: Did you receive and understand the discharge instructions provided?: Yes Medications obtained,verified, and reconciled?: Yes (Medications Reviewed) Any new allergies since your discharge?: No Dietary orders reviewed?: Yes Type of Diet Ordered:: Cardiac and Carb modified Do you have support at home?: Yes People in Home: facility resident Name of Support/Comfort Primary Source: [facility]  Medications Reviewed Today: Medications Reviewed Today     Reviewed by Moises Reusing, RN (Case Manager) on 09/26/23 at 1011  Med List Status: <None>   Medication Order Taking? Sig Documenting Provider Last Dose Status Informant  Alpha-Lipoic Acid 600 MG CAPS [id number] No Take 1 capsule (600 mg total) by mouth daily. Rilla Baller, MD Taking Active Self           Med Note CATHY OVAL DEL   Fri Aug 10, 2023  3:02 PM)    brimonidine -timolol  (COMBIGAN ) 0.2-0.5 % ophthalmic solution [id number] No Place 1 drop into both eyes every 12 (twelve) hours. [provider] Taking Active Self  clopidogrel  (PLAVIX ) 75 MG tablet [id number] No TAKE 1 TABLET DAILY Rilla Baller, MD Taking Active Self           Med Note GAYLENE DARVIN PARAS   Fri Sep 14, 2023 12:34 AM) Kurt of last dose  Coenzyme Q10 (COQ10) 150 MG CAPS [id number] No Take 1 capsule by mouth daily. [provider] Taking Active  Self           Med Note CATHY OVAL DEL   Fri Aug 10, 2023  3:02 PM)    DULoxetine  (CYMBALTA ) 60 MG capsule [id number]  TAKE 1 CAPSULE DAILY Rilla Baller, MD  Active   famotidine  (PEPCID ) 20 MG tablet [id number] No Take 1 tablet (20 mg total) by mouth at bedtime. Cherlyn Labella, MD Taking Active Self           Med Note CATHY OVAL DEL   Fri Aug 10, 2023  3:02 PM)    gabapentin  (NEURONTIN ) 300 MG capsule [id number] No Take 2 capsules (600 mg total) by mouth 3 (three) times daily.  Patient taking differently: Take 600 mg by mouth 3 (three) times daily. Takes 2 capsules four times a day   Rilla Baller, MD Taking Active Self  insulin  aspart (NOVOLOG ) 100 UNIT/ML injection [id number] No CBG 70 - 120: 0 units  CBG 121 - 150: 1 unit  CBG 151 - 200: 2 units  CBG 201 - 250: 3 units  CBG 251 - 300: 5 units  CBG 301 - 350: 7 units  CBG 351 - 400: 9 units Akula, Vijaya, MD Taking Active Self           Med Note CATHY OVAL DEL   Fri Aug 10, 2023  3:02 PM)    insulin  degludec (TRESIBA  FLEXTOUCH) 200 UNIT/ML FlexTouch Pen [id number]  Inject 20 Units into the skin daily. Caleen Qualia, MD  Active   Insulin  Pen Needle (ULTICARE SHORT PEN NEEDLES) 31G X 8 MM MISC [id number] No USE TO INJECT  MEDICATION DAILY Rilla Baller, MD Taking Active Self  levothyroxine  (SYNTHROID ) 50 MCG tablet [id number] No Take 1 tablet (50 mcg total) by mouth daily before breakfast. Per endo Dr Faythe Rilla Baller, MD Taking Active Self           Med Note CATHY OVAL DEL   Fri Aug 10, 2023  3:02 PM)    nystatin  cream (MYCOSTATIN ) [id number] No Apply 1 Application topically 2 (two) times daily. Apply to affected area Patt Alm Macho, MD Taking Active Self  ondansetron  (ZOFRAN ) 4 MG tablet [id number] No Take 1 tablet (4 mg total) by mouth daily as needed for nausea or vomiting. Rilla Baller, MD Taking Active Self  pramipexole  (MIRAPEX ) 1 MG tablet [id number]  TAKE 1 TABLET DAILY AT 4 P.M. Rilla Baller, MD  Active   Probiotic Product (PROBIOTIC PO) [id number] No Take by mouth in the morning and at bedtime. Takes 2 capsules twice a day [provider] Taking Active Self  RABEprazole  (ACIPHEX ) 20 MG tablet [id number] No Take 1 tablet (20 mg total) by mouth daily. Rilla Baller, MD Taking Active Self  rosuvastatin  (CRESTOR ) 40 MG tablet [id number] No TAKE 1 TABLET DAILY  Patient taking differently: Take 40 mg by mouth at bedtime.   Rilla Baller, MD Taking Active Self  Semaglutide , 1 MG/DOSE, 4 MG/3ML SOPN [id number] No Inject 1 mg into the skin once a week. Rilla Baller, MD Taking Active Self  sennosides-docusate sodium  (SENOKOT-S) 8.6-50 MG tablet [id number] No Take 1 tablet by mouth daily as needed for constipation. Shona Terry SAILOR, DO Taking Active Self  sodium chloride  (MURO 128) 5 % ophthalmic solution [id number] No 1 drop as needed for eye irritation. [provider] Taking Active Self           Med Note CATHY OVAL DEL   Fri Aug 10, 2023  3:02 PM)    tamsulosin  (FLOMAX ) 0.4 MG CAPS capsule [id number] No Take 1 capsule (0.4 mg total) by mouth daily after supper. Shona Terry SAILOR, DO Taking Active Self  vitamin C (ASCORBIC ACID ) 500 MG tablet [id number] No Take 1,000 mg by mouth daily as needed. [provider] Taking Active Self           Med Note CATHY OVAL DEL   Fri Aug 10, 2023  3:02 PM)              Home Care and Equipment/Supplies: Were Home Health Services Ordered?: Yes [facility] Has Agency set up a time to come to your home?: Yes First Home Health Visit Date: 09/27/23 Any new equipment or medical supplies ordered?: No  Functional Questionnaire: Do you need assistance with bathing/showering or dressing?: Yes Do you need assistance with meal preparation?: Yes Do you need assistance with eating?: No Do you have difficulty maintaining continence: No Do you need assistance with getting out of bed/getting  out of a chair/moving?: Yes Do you have difficulty managing or taking your medications?: No  Follow up appointments reviewed: PCP Follow-up appointment confirmed?: No MD Provider Line Number:[phone] Given: No (The patient declined) Specialist Hospital Follow-up appointment confirmed?: NA Do you need transportation to your follow-up appointment?: No Do you understand care options if your condition(s) worsen?: Yes-patient verbalized understanding  SDOH Interventions Today    Flowsheet Row Most Recent Value  SDOH Interventions   Food Insecurity Interventions Intervention Not Indicated  Housing Interventions Intervention Not Indicated  Transportation Interventions Intervention Not Indicated  Utilities Interventions Intervention Not Indicated  Social Connections  Interventions Patient Declined      Interventions Today    Flowsheet Row Most Recent Value  Chronic Disease   Chronic disease during today's visit Diabetes, Congestive Heart Failure (CHF)  General Interventions   General Interventions Discussed/Reviewed Doctor Visits, General Interventions Discussed  Doctor Visits Discussed/Reviewed Doctor Visits Discussed  Education Interventions   Education Provided Provided Education  [facility] and DM]  Safety Interventions   Safety Discussed/Reviewed Home Safety  Home Safety Assistive Devices       [facility] Outreach today to the patient's daughter. The patient's phone goes straight to VM and the daughter states he wouldn't listen to it anyway. He lives in an ILF facility and has paid caregivers 24/7 currently. The patient is a retired development worker, community and she states he is going to do what he wants to do and not necessarily follow recommendations. He is basically wheelchair bound and only stands to get into the wheelchair, the toilet and the couch. He monitors his medications with the help of the paid staff. The patient has a diagnosis of CHF but he is not compliant with daily weights and fluid  modifications. His DM medications were changed in the hospital and his daughter states he should see a provider about this and his blood sugars but the patient is declining this presently. His blood sugar last night was 227 and he is on SSI but he did not want to take the units prescribed. The facility where he lives has on site rehab services and he will start PT this week. His daughter states he is in charge of his care and she will make recommendations to him to contact PCP to set up a post hospital visit. RNCM will message the provider.  The patient has been provided with contact information for the care management team and has been advised to call with any health-related questions or concerns. The patient verbalized understanding with current POC. The patient is directed to their insurance card regarding availability of benefits coverage.    Medford Balboa, RN Medical Illustrator [facility] [phone]

## 2023-09-27 DIAGNOSIS — N1832 Chronic kidney disease, stage 3b: Secondary | ICD-10-CM | POA: Diagnosis not present

## 2023-09-27 DIAGNOSIS — R1312 Dysphagia, oropharyngeal phase: Secondary | ICD-10-CM | POA: Diagnosis not present

## 2023-09-27 DIAGNOSIS — R490 Dysphonia: Secondary | ICD-10-CM | POA: Diagnosis not present

## 2023-09-27 DIAGNOSIS — M6281 Muscle weakness (generalized): Secondary | ICD-10-CM | POA: Diagnosis not present

## 2023-09-27 DIAGNOSIS — N189 Chronic kidney disease, unspecified: Secondary | ICD-10-CM | POA: Diagnosis not present

## 2023-09-27 DIAGNOSIS — I69398 Other sequelae of cerebral infarction: Secondary | ICD-10-CM | POA: Diagnosis not present

## 2023-09-27 DIAGNOSIS — I69391 Dysphagia following cerebral infarction: Secondary | ICD-10-CM | POA: Diagnosis not present

## 2023-09-27 DIAGNOSIS — E1122 Type 2 diabetes mellitus with diabetic chronic kidney disease: Secondary | ICD-10-CM | POA: Diagnosis not present

## 2023-09-27 DIAGNOSIS — N200 Calculus of kidney: Secondary | ICD-10-CM | POA: Diagnosis not present

## 2023-09-27 DIAGNOSIS — R600 Localized edema: Secondary | ICD-10-CM | POA: Diagnosis not present

## 2023-09-27 DIAGNOSIS — I69311 Memory deficit following cerebral infarction: Secondary | ICD-10-CM | POA: Diagnosis not present

## 2023-09-27 DIAGNOSIS — U071 COVID-19: Secondary | ICD-10-CM | POA: Diagnosis not present

## 2023-09-27 DIAGNOSIS — N390 Urinary tract infection, site not specified: Secondary | ICD-10-CM | POA: Diagnosis not present

## 2023-09-28 DIAGNOSIS — L97522 Non-pressure chronic ulcer of other part of left foot with fat layer exposed: Secondary | ICD-10-CM | POA: Diagnosis not present

## 2023-09-28 DIAGNOSIS — E1142 Type 2 diabetes mellitus with diabetic polyneuropathy: Secondary | ICD-10-CM | POA: Diagnosis not present

## 2023-09-30 ENCOUNTER — Encounter (HOSPITAL_COMMUNITY): Payer: Self-pay

## 2023-09-30 ENCOUNTER — Emergency Department: Payer: Medicare PPO

## 2023-09-30 ENCOUNTER — Emergency Department
Admission: EM | Admit: 2023-09-30 | Discharge: 2023-09-30 | Disposition: A | Discharge status: Other Acute Inpatient Hospital | Payer: Medicare PPO | Attending: Student in an Organized Health Care Education/Training Program | Admitting: Student in an Organized Health Care Education/Training Program

## 2023-09-30 ENCOUNTER — Inpatient Hospital Stay (HOSPITAL_COMMUNITY)
Admission: EM | Admit: 2023-09-30 | Discharge: 2023-10-20 | DRG: 177 | Disposition: E | Payer: Medicare PPO | Attending: Internal Medicine | Admitting: Internal Medicine

## 2023-09-30 ENCOUNTER — Encounter (HOSPITAL_COMMUNITY): Payer: Self-pay | Admitting: Internal Medicine

## 2023-09-30 ENCOUNTER — Other Ambulatory Visit: Payer: Self-pay

## 2023-09-30 DIAGNOSIS — N138 Other obstructive and reflux uropathy: Secondary | ICD-10-CM | POA: Diagnosis present

## 2023-09-30 DIAGNOSIS — J690 Pneumonitis due to inhalation of food and vomit: Secondary | ICD-10-CM | POA: Diagnosis not present

## 2023-09-30 DIAGNOSIS — I509 Heart failure, unspecified: Secondary | ICD-10-CM | POA: Insufficient documentation

## 2023-09-30 DIAGNOSIS — R000 Tachycardia, unspecified: Secondary | ICD-10-CM | POA: Diagnosis present

## 2023-09-30 DIAGNOSIS — R0602 Shortness of breath: Secondary | ICD-10-CM

## 2023-09-30 DIAGNOSIS — R0902 Hypoxemia: Secondary | ICD-10-CM | POA: Diagnosis not present

## 2023-09-30 DIAGNOSIS — I69351 Hemiplegia and hemiparesis following cerebral infarction affecting right dominant side: Secondary | ICD-10-CM

## 2023-09-30 DIAGNOSIS — G43909 Migraine, unspecified, not intractable, without status migrainosus: Secondary | ICD-10-CM | POA: Diagnosis present

## 2023-09-30 DIAGNOSIS — Z79899 Other long term (current) drug therapy: Secondary | ICD-10-CM

## 2023-09-30 DIAGNOSIS — G8929 Other chronic pain: Secondary | ICD-10-CM | POA: Diagnosis present

## 2023-09-30 DIAGNOSIS — J9811 Atelectasis: Secondary | ICD-10-CM | POA: Diagnosis present

## 2023-09-30 DIAGNOSIS — E860 Dehydration: Secondary | ICD-10-CM | POA: Diagnosis present

## 2023-09-30 DIAGNOSIS — R451 Restlessness and agitation: Secondary | ICD-10-CM | POA: Diagnosis not present

## 2023-09-30 DIAGNOSIS — F0394 Unspecified dementia, unspecified severity, with anxiety: Secondary | ICD-10-CM | POA: Diagnosis present

## 2023-09-30 DIAGNOSIS — K219 Gastro-esophageal reflux disease without esophagitis: Secondary | ICD-10-CM | POA: Diagnosis present

## 2023-09-30 DIAGNOSIS — Z833 Family history of diabetes mellitus: Secondary | ICD-10-CM

## 2023-09-30 DIAGNOSIS — R4589 Other symptoms and signs involving emotional state: Secondary | ICD-10-CM | POA: Diagnosis not present

## 2023-09-30 DIAGNOSIS — R627 Adult failure to thrive: Secondary | ICD-10-CM | POA: Diagnosis present

## 2023-09-30 DIAGNOSIS — B964 Proteus (mirabilis) (morganii) as the cause of diseases classified elsewhere: Secondary | ICD-10-CM | POA: Diagnosis not present

## 2023-09-30 DIAGNOSIS — Z6825 Body mass index (BMI) 25.0-25.9, adult: Secondary | ICD-10-CM

## 2023-09-30 DIAGNOSIS — I959 Hypotension, unspecified: Secondary | ICD-10-CM | POA: Diagnosis present

## 2023-09-30 DIAGNOSIS — E1142 Type 2 diabetes mellitus with diabetic polyneuropathy: Secondary | ICD-10-CM | POA: Diagnosis present

## 2023-09-30 DIAGNOSIS — K5900 Constipation, unspecified: Secondary | ICD-10-CM | POA: Diagnosis present

## 2023-09-30 DIAGNOSIS — Z7189 Other specified counseling: Secondary | ICD-10-CM

## 2023-09-30 DIAGNOSIS — I130 Hypertensive heart and chronic kidney disease with heart failure and stage 1 through stage 4 chronic kidney disease, or unspecified chronic kidney disease: Secondary | ICD-10-CM | POA: Diagnosis present

## 2023-09-30 DIAGNOSIS — Z66 Do not resuscitate: Secondary | ICD-10-CM | POA: Diagnosis not present

## 2023-09-30 DIAGNOSIS — Z1152 Encounter for screening for COVID-19: Secondary | ICD-10-CM

## 2023-09-30 DIAGNOSIS — R531 Weakness: Secondary | ICD-10-CM | POA: Diagnosis not present

## 2023-09-30 DIAGNOSIS — I6523 Occlusion and stenosis of bilateral carotid arteries: Secondary | ICD-10-CM | POA: Diagnosis not present

## 2023-09-30 DIAGNOSIS — G2581 Restless legs syndrome: Secondary | ICD-10-CM | POA: Diagnosis present

## 2023-09-30 DIAGNOSIS — I5032 Chronic diastolic (congestive) heart failure: Secondary | ICD-10-CM | POA: Diagnosis present

## 2023-09-30 DIAGNOSIS — N1832 Chronic kidney disease, stage 3b: Secondary | ICD-10-CM | POA: Diagnosis present

## 2023-09-30 DIAGNOSIS — Z89422 Acquired absence of other left toe(s): Secondary | ICD-10-CM

## 2023-09-30 DIAGNOSIS — Z85828 Personal history of other malignant neoplasm of skin: Secondary | ICD-10-CM

## 2023-09-30 DIAGNOSIS — I69392 Facial weakness following cerebral infarction: Secondary | ICD-10-CM | POA: Diagnosis not present

## 2023-09-30 DIAGNOSIS — E1165 Type 2 diabetes mellitus with hyperglycemia: Secondary | ICD-10-CM | POA: Diagnosis present

## 2023-09-30 DIAGNOSIS — N529 Male erectile dysfunction, unspecified: Secondary | ICD-10-CM | POA: Diagnosis present

## 2023-09-30 DIAGNOSIS — Z711 Person with feared health complaint in whom no diagnosis is made: Secondary | ICD-10-CM | POA: Diagnosis not present

## 2023-09-30 DIAGNOSIS — N390 Urinary tract infection, site not specified: Secondary | ICD-10-CM | POA: Diagnosis present

## 2023-09-30 DIAGNOSIS — N179 Acute kidney failure, unspecified: Principal | ICD-10-CM | POA: Diagnosis present

## 2023-09-30 DIAGNOSIS — Z841 Family history of disorders of kidney and ureter: Secondary | ICD-10-CM

## 2023-09-30 DIAGNOSIS — N3000 Acute cystitis without hematuria: Secondary | ICD-10-CM | POA: Diagnosis not present

## 2023-09-30 DIAGNOSIS — Z7985 Long-term (current) use of injectable non-insulin antidiabetic drugs: Secondary | ICD-10-CM

## 2023-09-30 DIAGNOSIS — Z885 Allergy status to narcotic agent status: Secondary | ICD-10-CM

## 2023-09-30 DIAGNOSIS — N401 Enlarged prostate with lower urinary tract symptoms: Secondary | ICD-10-CM | POA: Diagnosis present

## 2023-09-30 DIAGNOSIS — R1310 Dysphagia, unspecified: Secondary | ICD-10-CM | POA: Diagnosis present

## 2023-09-30 DIAGNOSIS — Z515 Encounter for palliative care: Secondary | ICD-10-CM | POA: Diagnosis not present

## 2023-09-30 DIAGNOSIS — G9340 Encephalopathy, unspecified: Secondary | ICD-10-CM | POA: Diagnosis not present

## 2023-09-30 DIAGNOSIS — E1169 Type 2 diabetes mellitus with other specified complication: Secondary | ICD-10-CM | POA: Diagnosis present

## 2023-09-30 DIAGNOSIS — U071 COVID-19: Secondary | ICD-10-CM | POA: Diagnosis not present

## 2023-09-30 DIAGNOSIS — H409 Unspecified glaucoma: Secondary | ICD-10-CM | POA: Diagnosis present

## 2023-09-30 DIAGNOSIS — F0393 Unspecified dementia, unspecified severity, with mood disturbance: Secondary | ICD-10-CM | POA: Diagnosis present

## 2023-09-30 DIAGNOSIS — R471 Dysarthria and anarthria: Secondary | ICD-10-CM | POA: Diagnosis present

## 2023-09-30 DIAGNOSIS — L719 Rosacea, unspecified: Secondary | ICD-10-CM | POA: Diagnosis present

## 2023-09-30 DIAGNOSIS — F32A Depression, unspecified: Secondary | ICD-10-CM | POA: Diagnosis present

## 2023-09-30 DIAGNOSIS — Z823 Family history of stroke: Secondary | ICD-10-CM

## 2023-09-30 DIAGNOSIS — Z794 Long term (current) use of insulin: Secondary | ICD-10-CM

## 2023-09-30 DIAGNOSIS — R918 Other nonspecific abnormal finding of lung field: Secondary | ICD-10-CM | POA: Diagnosis not present

## 2023-09-30 DIAGNOSIS — E1122 Type 2 diabetes mellitus with diabetic chronic kidney disease: Secondary | ICD-10-CM | POA: Diagnosis not present

## 2023-09-30 DIAGNOSIS — R52 Pain, unspecified: Secondary | ICD-10-CM

## 2023-09-30 DIAGNOSIS — Z888 Allergy status to other drugs, medicaments and biological substances status: Secondary | ICD-10-CM

## 2023-09-30 DIAGNOSIS — E039 Hypothyroidism, unspecified: Secondary | ICD-10-CM | POA: Diagnosis present

## 2023-09-30 DIAGNOSIS — G9341 Metabolic encephalopathy: Principal | ICD-10-CM | POA: Diagnosis present

## 2023-09-30 DIAGNOSIS — Z87440 Personal history of urinary (tract) infections: Secondary | ICD-10-CM

## 2023-09-30 DIAGNOSIS — G9389 Other specified disorders of brain: Secondary | ICD-10-CM | POA: Diagnosis not present

## 2023-09-30 DIAGNOSIS — R4182 Altered mental status, unspecified: Secondary | ICD-10-CM | POA: Diagnosis not present

## 2023-09-30 DIAGNOSIS — Z8616 Personal history of COVID-19: Secondary | ICD-10-CM

## 2023-09-30 DIAGNOSIS — E785 Hyperlipidemia, unspecified: Secondary | ICD-10-CM | POA: Diagnosis present

## 2023-09-30 DIAGNOSIS — Z87891 Personal history of nicotine dependence: Secondary | ICD-10-CM

## 2023-09-30 DIAGNOSIS — H9113 Presbycusis, bilateral: Secondary | ICD-10-CM | POA: Diagnosis present

## 2023-09-30 DIAGNOSIS — I672 Cerebral atherosclerosis: Secondary | ICD-10-CM | POA: Diagnosis not present

## 2023-09-30 DIAGNOSIS — Z7902 Long term (current) use of antithrombotics/antiplatelets: Secondary | ICD-10-CM

## 2023-09-30 DIAGNOSIS — G4733 Obstructive sleep apnea (adult) (pediatric): Secondary | ICD-10-CM | POA: Diagnosis present

## 2023-09-30 DIAGNOSIS — Z79890 Hormone replacement therapy (postmenopausal): Secondary | ICD-10-CM

## 2023-09-30 LAB — URINALYSIS, ROUTINE W REFLEX MICROSCOPIC
Bilirubin Urine: NEGATIVE
Glucose, UA: 50 mg/dL — AB
Ketones, ur: NEGATIVE mg/dL
Nitrite: NEGATIVE
Protein, ur: 100 mg/dL — AB
Specific Gravity, Urine: 1.013 (ref 1.005–1.030)
WBC, UA: >50 WBC/hpf (ref 0–5)
pH: 5 (ref 5.0–8.0)

## 2023-09-30 LAB — CBG MONITORING, ED: Glucose-Capillary: 196 mg/dL — ABNORMAL HIGH (ref 70–99)

## 2023-09-30 LAB — BASIC METABOLIC PANEL
Anion gap: 11 (ref 5–15)
BUN: 41 mg/dL — ABNORMAL HIGH (ref 8–23)
CO2: 21 mmol/L — ABNORMAL LOW (ref 22–32)
Calcium: 8.7 mg/dL — ABNORMAL LOW (ref 8.9–10.3)
Chloride: 104 mmol/L (ref 98–111)
Creatinine, Ser: 2.4 mg/dL — ABNORMAL HIGH (ref 0.61–1.24)
GFR, Estimated: 25 mL/min — ABNORMAL LOW (ref >60)
Glucose, Bld: 200 mg/dL — ABNORMAL HIGH (ref 70–99)
Potassium: 4.1 mmol/L (ref 3.5–5.1)
Sodium: 136 mmol/L (ref 135–145)

## 2023-09-30 LAB — GLUCOSE, CAPILLARY
Glucose-Capillary: 184 mg/dL — ABNORMAL HIGH (ref 70–99)
Glucose-Capillary: 156 mg/dL — ABNORMAL HIGH (ref 70–99)

## 2023-09-30 LAB — CBC
HCT: 48 % (ref 39.0–52.0)
Hemoglobin: 15.7 g/dL (ref 13.0–17.0)
MCH: 29.1 pg (ref 26.0–34.0)
MCHC: 32.7 g/dL (ref 30.0–36.0)
MCV: 88.9 fL (ref 80.0–100.0)
Platelets: 248 10*3/uL (ref 150–400)
RBC: 5.4 MIL/uL (ref 4.22–5.81)
RDW: 13.2 % (ref 11.5–15.5)
WBC: 14 10*3/uL — ABNORMAL HIGH (ref 4.0–10.5)
nRBC: 0 % (ref 0.0–0.2)

## 2023-09-30 LAB — LACTIC ACID, PLASMA: Lactic Acid, Venous: 1.1 mmol/L (ref 0.5–1.9)

## 2023-09-30 MED ORDER — LEVOTHYROXINE SODIUM 50 MCG PO TABS
50.0000 ug | ORAL_TABLET | Freq: Every day | ORAL | Status: DC
  Administered 2023-10-01 – 2023-10-06 (×5): 50 ug via ORAL
  Filled 2023-09-30 (×5): qty 1

## 2023-09-30 MED ORDER — CLOPIDOGREL BISULFATE 75 MG PO TABS
75.0000 mg | ORAL_TABLET | Freq: Every day | ORAL | Status: DC
  Administered 2023-10-01 – 2023-10-06 (×6): 75 mg via ORAL
  Filled 2023-09-30 (×6): qty 1

## 2023-09-30 MED ORDER — ONDANSETRON HCL 4 MG PO TABS: 4.0000 mg | ORAL_TABLET | Freq: Four times a day (QID) | ORAL | Status: DC | PRN

## 2023-09-30 MED ORDER — ACETAMINOPHEN 325 MG PO TABS
650.0000 mg | ORAL_TABLET | Freq: Four times a day (QID) | ORAL | Status: DC | PRN
  Administered 2023-09-30 – 2023-10-06 (×3): 650 mg via ORAL
  Filled 2023-09-30 (×4): qty 2

## 2023-09-30 MED ORDER — SODIUM CHLORIDE 0.9 % IV BOLUS
500.0000 mL | Freq: Once | INTRAVENOUS | Status: AC
  Administered 2023-09-30: 500 mL via INTRAVENOUS

## 2023-09-30 MED ORDER — SODIUM CHLORIDE 0.9 % IV SOLN
1.0000 g | Freq: Once | INTRAVENOUS | Status: DC
  Administered 2023-09-30: 1 g via INTRAVENOUS
  Filled 2023-09-30: qty 10

## 2023-09-30 MED ORDER — HEPARIN SODIUM (PORCINE) 5000 UNIT/ML IJ SOLN
5000.0000 [IU] | Freq: Three times a day (TID) | INTRAMUSCULAR | Status: DC
  Administered 2023-09-30 – 2023-10-06 (×15): 5000 [IU] via SUBCUTANEOUS
  Filled 2023-09-30 (×17): qty 1

## 2023-09-30 MED ORDER — PANTOPRAZOLE SODIUM 40 MG PO TBEC
40.0000 mg | DELAYED_RELEASE_TABLET | Freq: Every day | ORAL | Status: DC
  Administered 2023-10-01 – 2023-10-06 (×6): 40 mg via ORAL
  Filled 2023-09-30 (×6): qty 1

## 2023-09-30 MED ORDER — GABAPENTIN 300 MG PO CAPS
300.0000 mg | ORAL_CAPSULE | Freq: Three times a day (TID) | ORAL | Status: DC
Start: 2023-09-30 — End: 2023-10-02
  Administered 2023-09-30 – 2023-10-02 (×5): 300 mg via ORAL
  Filled 2023-09-30 (×5): qty 1

## 2023-09-30 MED ORDER — TIMOLOL MALEATE 0.5 % OP SOLN
1.0000 [drp] | Freq: Two times a day (BID) | OPHTHALMIC | Status: DC
  Administered 2023-09-30 – 2023-10-07 (×15): 1 [drp] via OPHTHALMIC
  Filled 2023-09-30: qty 5

## 2023-09-30 MED ORDER — PRAMIPEXOLE DIHYDROCHLORIDE 1 MG PO TABS
1.0000 mg | ORAL_TABLET | Freq: Every day | ORAL | Status: DC
  Administered 2023-09-30 – 2023-10-05 (×6): 1 mg via ORAL
  Filled 2023-09-30 (×6): qty 1

## 2023-09-30 MED ORDER — ENOXAPARIN SODIUM 40 MG/0.4ML IJ SOSY: 40.0000 mg | PREFILLED_SYRINGE | INTRAMUSCULAR | Status: DC

## 2023-09-30 MED ORDER — FAMOTIDINE 20 MG PO TABS
20.0000 mg | ORAL_TABLET | Freq: Every day | ORAL | Status: DC
  Administered 2023-09-30 – 2023-10-05 (×5): 20 mg via ORAL
  Filled 2023-09-30 (×6): qty 1

## 2023-09-30 MED ORDER — GABAPENTIN 300 MG PO CAPS
300.0000 mg | ORAL_CAPSULE | Freq: Three times a day (TID) | ORAL | Status: DC
  Administered 2023-09-30: 300 mg via ORAL
  Filled 2023-09-30: qty 1

## 2023-09-30 MED ORDER — ENSURE ENLIVE PO LIQD
237.0000 mL | Freq: Two times a day (BID) | ORAL | Status: DC
  Administered 2023-10-01 – 2023-10-04 (×4): 237 mL via ORAL

## 2023-09-30 MED ORDER — ONDANSETRON HCL 4 MG/2ML IJ SOLN
4.0000 mg | Freq: Four times a day (QID) | INTRAMUSCULAR | Status: DC | PRN
  Administered 2023-10-03: 4 mg via INTRAVENOUS
  Filled 2023-09-30: qty 2

## 2023-09-30 MED ORDER — SODIUM CHLORIDE 0.9 % IV SOLN
500.0000 mg | Freq: Two times a day (BID) | INTRAVENOUS | Status: DC
  Administered 2023-09-30: 500 mg via INTRAVENOUS
  Filled 2023-09-30 (×2): qty 10

## 2023-09-30 MED ORDER — BRIMONIDINE TARTRATE-TIMOLOL 0.2-0.5 % OP SOLN: 1.0000 [drp] | Freq: Two times a day (BID) | OPHTHALMIC | Status: DC

## 2023-09-30 MED ORDER — DULOXETINE HCL 60 MG PO CPEP
60.0000 mg | ORAL_CAPSULE | Freq: Every day | ORAL | Status: DC
Start: 2023-10-01 — End: 2023-10-06
  Administered 2023-10-01 – 2023-10-06 (×6): 60 mg via ORAL
  Filled 2023-09-30 (×2): qty 2
  Filled 2023-09-30: qty 1
  Filled 2023-09-30 (×3): qty 2

## 2023-09-30 MED ORDER — MEROPENEM 1 G IV SOLR: 1.0000 g | Freq: Three times a day (TID) | INTRAVENOUS | Status: DC

## 2023-09-30 MED ORDER — SODIUM CHLORIDE 0.9 % IV SOLN: Freq: Once | INTRAVENOUS | Status: AC

## 2023-09-30 MED ORDER — ACETAMINOPHEN 650 MG RE SUPP
650.0000 mg | Freq: Four times a day (QID) | RECTAL | Status: DC | PRN
  Administered 2023-10-05: 650 mg via RECTAL
  Filled 2023-09-30 (×2): qty 1

## 2023-09-30 MED ORDER — BRIMONIDINE TARTRATE 0.2 % OP SOLN
1.0000 [drp] | Freq: Two times a day (BID) | OPHTHALMIC | Status: DC
  Administered 2023-09-30 – 2023-10-07 (×15): 1 [drp] via OPHTHALMIC
  Filled 2023-09-30: qty 5

## 2023-09-30 MED ORDER — ROSUVASTATIN CALCIUM 10 MG PO TABS
40.0000 mg | ORAL_TABLET | Freq: Every day | ORAL | Status: DC
Start: 2023-10-01 — End: 2023-10-02
  Administered 2023-10-01 – 2023-10-02 (×2): 40 mg via ORAL
  Filled 2023-09-30 (×2): qty 4

## 2023-09-30 MED ORDER — SODIUM CHLORIDE 0.9 % IV SOLN
500.0000 mg | Freq: Two times a day (BID) | INTRAVENOUS | Status: DC
  Administered 2023-09-30 – 2023-10-01 (×2): 500 mg via INTRAVENOUS
  Filled 2023-09-30 (×3): qty 10

## 2023-09-30 MED ORDER — TAMSULOSIN HCL 0.4 MG PO CAPS
0.4000 mg | ORAL_CAPSULE | Freq: Every day | ORAL | Status: DC
  Administered 2023-09-30 – 2023-10-05 (×6): 0.4 mg via ORAL
  Filled 2023-09-30 (×6): qty 1

## 2023-09-30 MED ORDER — INSULIN ASPART 100 UNIT/ML IJ SOLN
0.0000 [IU] | Freq: Three times a day (TID) | INTRAMUSCULAR | Status: DC
  Administered 2023-10-01: 3 [IU] via SUBCUTANEOUS
  Administered 2023-10-01: 1 [IU] via SUBCUTANEOUS
  Administered 2023-10-01: 2 [IU] via SUBCUTANEOUS
  Administered 2023-10-02: 1 [IU] via SUBCUTANEOUS
  Administered 2023-10-03: 2 [IU] via SUBCUTANEOUS
  Administered 2023-10-04: 1 [IU] via SUBCUTANEOUS
  Administered 2023-10-05 (×2): 2 [IU] via SUBCUTANEOUS
  Administered 2023-10-06: 1 [IU] via SUBCUTANEOUS
  Administered 2023-10-06: 2 [IU] via SUBCUTANEOUS

## 2023-09-30 NOTE — H&P (Signed)
 History and Physical    Patient: James SONES, James Bell FMW:[id number] DOB: 07/13/1931 DOA: 09/30/2023 DOS: the patient was seen and examined on 09/30/2023 PCP: Rilla Baller, James Bell  Patient coming from: Home  Chief Complaint: No chief complaint on file.  HPI: James GORMAN Fearing, James Bell is a 89 y.o. male with medical history significant of anemia, basal cell carcinoma, BPH, CKD, CVA, dehydration, depression, erectile dysfunction, GERD, hiatal hernia, hyperlipidemia, bilateral renal cysts, migraine headaches, OSA on CPAP, palpitations, presbycusis of both ears, restless leg syndrome, rosacea, TIA, type 2 diabetes, diabetic peripheral neuropathy, stage IIIb CKD who was recently discharged from [facility] where he was admitted for 12 days due to COVID-19 and urinary tract infection, discharged home 5 days ago who arrived from home to the emergency department via EMS due to decreased mentation and generalized weakness since early morning.  He is still showing some encephalopathy and is febrile at the moment.  History is taken from the patient's daughter Asberry, who is also his POA.  Lab work: CBC showed a white count of 14, hemoglobin 15.7 grams per deciliter and platelets 248.  BMP showed a CO2 of 21 mmol/L with a normal anion gap, glucose 200, BUN 41, creatinine 2.40 and calcium  8.7 mg/dL.  The rest of the electrolytes were normal.  Unremarkable lactic acid.  Imaging: Portable 1 view chest radiograph showing normal heart size and mediastinal contours with no active disease.  CT head without contrast with no acute intracranial abnormality.  Stable atrophy and white matter disease.  Remote infarct involving the posterior left lentiform nucleus.   ED course: Initial vital signs were temperature 97.7 F, pulse 118, respiration 21, BP 124/68 mmHg O2 sat 95% on room air.  Patient received ceftriaxone  1 g IVPB, meropenem  500 mg IVPB normal saline 500 mL bolus followed by normal saline at 75 mL/h and gabapentin  300 mg  capsule p.o. x 1.  Review of Systems: As mentioned in the history of present illness. All other systems reviewed and are negative. Past Medical History:  Diagnosis Date   Anemia    Basal cell carcinoma 2018   R temple    Basal cell carcinoma of nose 2002   bridge of nose   BPH with obstruction/lower urinary tract symptoms    failed flomax , uroxatral, myrbetriq  - sees urology Macky) pending videourodynamics   CKD (chronic kidney disease) stage 3, GFR 30-59 ml/min (HCC) 03/11/2012   Renal US  02/2014 - multiple kidney cysts    CVA (cerebral infarction) 03/07/2012   slurred speech; right hemplegia, left handed - completed PT 07/2014 (17 sessions)   Dehydration    Depression    mild, on cymbalta  per prior PCP   Erectile dysfunction    per urology (prostaglandin injection)   GERD (gastroesophageal reflux disease)    with sliding HH   H/O hiatal hernia    History of kidney problems 12/2010   lacerated left kidney after fall   HLD (hyperlipidemia)    Hx of basal cell carcinoma 07/10/2017   R. lat. forehead near hair line   Kidney cysts 02/2014   bilateral (renal US )   Migraines 03/11/2012   now rare   OSA on CPAP    sleep study 01/2014 - severe, CPAP at 10, previously on nuvigil (Dr. Roz Mar)   Palpitations 01/2012   holter WNL, rare PAC/PVCs   Presbycusis of both ears 2016   rec amplification [facility])   Restless leg syndrome    well controlled on mirapex    Rosacea  [place]   TIA (transient ischemic attack)    Type 2 diabetes, controlled, with neuropathy [facility]) 2011   Dr Faythe in [facility]   Past Surgical History:  Procedure Laterality Date   ABI  09/18/2005   WNL   AMPUTATION TOE Left 03/02/2023   Procedure: 71174 - TOE INTERPHALANGEAL JOINT - SECOND;  Surgeon: Lennie Barter, DPM;  Location: [facility] ORS;  Service: Podiatry;  Laterality: Left;   CARDIAC CATHETERIZATION  09/18/2002   normal; [place]   CARDIOVASCULAR STRESS TEST  02/16/2014   WNL, low risk scan, EF  68%   CAROTID ENDARTERECTOMY     pt denies 02/27/23   CATARACT EXTRACTION W/ INTRAOCULAR LENS IMPLANT  11//2012 - 08/2011   COLONOSCOPY  03/19/2011   poor prep, unable to biopsy polyp in tic fold, rpt 3 mo Jayne)   COLONOSCOPY  06/19/2011   mult polyps adenomatous, severe diverticulosis, rpt 3 yrs Jayne)   CYSTOSCOPY WITH URETHRAL DILATATION N/A 07/24/2013   Procedure: CYSTOSCOPY WITH URETHRAL DILATATION;  Surgeon: Arlena LILLETTE Gal, James Bell;  Location: [facility] ORS;  Service: Urology;  Laterality: N/A;   ESOPHAGOGASTRODUODENOSCOPY  09/18/2004   duodenitis, medual HH, Grade 2 reflux, mild gastritis   ESOPHAGOGASTRODUODENOSCOPY  07/20/2011   mild chronic gastritis   HEMORRHOID SURGERY  09/18/1989   INGUINAL HERNIA REPAIR  1981; 2002   left, then repaired   PROSTATE SURGERY  02/16/2009   PVP (laser)   SKIN CANCER EXCISION  09/18/2000   bridge of nose, BCC   TONSILLECTOMY AND ADENOIDECTOMY  ~ 1945   TRANSURETHRAL INCISION OF PROSTATE N/A 07/24/2013   Procedure: TRANSURETHRAL INCISION OF THE PROSTATE (TUIP);  Surgeon: Arlena LILLETTE Gal, James Bell;  Location: [facility] ORS;  Service: Urology;  Laterality: N/A;   URETHRAL DILATION  09/18/2012   tannenbaum   Social History:  reports that he has never smoked. He has never been exposed to tobacco smoke. He has never used smokeless tobacco. He reports that he does not currently use alcohol  after a past usage of about 4.0 standard drinks of alcohol  per week. He reports that he does not use drugs.  Allergies  Allergen Reactions   Dulaglutide     Other reaction(s): upset stomach Other reaction(s): Abdominal Pain, upset stomach Other reaction(s): upset stomach    Empagliflozin Other (See Comments)    Other reaction(s): yeast infection Other reaction(s): Other (See Comments) Other reaction(s): yeast infection    Lisinopril  Cough    tachycardia   Nexium  [Esomeprazole ] Other (See Comments)    Headache   Oxycodone  Other (See Comments)    Even 1/2 tablet of  5mg  caused increased confusion - contributory to hospitalization 10/2020 with AMS   Pregabalin Nausea Only and Other (See Comments)    Other reaction(s): MALAISE, SLIGHT NAUSEA, SLIGHT HEADACHE Other reaction(s): MALAISE, SLIGHT NAUSEA, SLIGHT HEADACHE    Rosuvastatin  Other (See Comments)    Other reaction(s): muscle pain 2.16.2022 Pt is current on this medication without any issues. Other reaction(s): muscle pain 2.16.2022 Pt is current on this medication without any issues. Other reaction(s): muscle pain    Family History  Problem Relation Age of Onset   Stroke Father    Lung cancer Father 16        smoker   Alcoholism Sister    Kidney disease Sister    Stroke Brother    Diabetes Brother 49   Lung cancer Brother    Coronary artery disease Neg Hx    Sleep apnea Neg Hx     Prior to  Admission medications   Medication Sig Start Date End Date Taking? Authorizing Provider  Alpha-Lipoic Acid 600 MG CAPS Take 1 capsule (600 mg total) by mouth daily. 06/28/21   Rilla Baller, James Bell  brimonidine -timolol  (COMBIGAN ) 0.2-0.5 % ophthalmic solution Place 1 drop into both eyes every 12 (twelve) hours.    Provider, Historical, James Bell  clopidogrel  (PLAVIX ) 75 MG tablet TAKE 1 TABLET DAILY 04/18/23   Rilla Baller, James Bell  Coenzyme Q10 (COQ10) 150 MG CAPS Take 1 capsule by mouth daily.    Provider, Historical, James Bell  DULoxetine  (CYMBALTA ) 60 MG capsule TAKE 1 CAPSULE DAILY 09/25/23   Rilla Baller, James Bell  famotidine  (PEPCID ) 20 MG tablet Take 1 tablet (20 mg total) by mouth at bedtime. 04/05/21   Akula, Vijaya, James Bell  gabapentin  (NEURONTIN ) 300 MG capsule Take 2 capsules (600 mg total) by mouth 3 (three) times daily. 04/18/23   Rilla Baller, James Bell  insulin  aspart (NOVOLOG ) 100 UNIT/ML injection CBG 70 - 120: 0 units  CBG 121 - 150: 1 unit  CBG 151 - 200: 2 units  CBG 201 - 250: 3 units  CBG 251 - 300: 5 units  CBG 301 - 350: 7 units  CBG 351 - 400: 9 units 04/05/21   Akula, Vijaya, James Bell  insulin  degludec  (TRESIBA  FLEXTOUCH) 200 UNIT/ML FlexTouch Pen Inject 20 Units into the skin daily. 09/25/23   Caleen Qualia, James Bell  Insulin  Pen Needle (ULTICARE SHORT PEN NEEDLES) 31G X 8 MM MISC USE TO INJECT MEDICATION DAILY 12/18/22   Rilla Baller, James Bell  levothyroxine  (SYNTHROID ) 50 MCG tablet Take 1 tablet (50 mcg total) by mouth daily before breakfast. Per endo Dr Faythe 12/07/19   Rilla Baller, James Bell  levothyroxine  (SYNTHROID ) 50 MCG tablet Take 50 mcg by mouth daily before breakfast. 04/16/23   Provider, Historical, James Bell  NOVOLOG  FLEXPEN 100 UNIT/ML FlexPen Inject into the skin. CBG 70 - 120: 0 units CBG 121 - 150: 1 unit CBG 151 - 200: 2 units CBG 201 - 250: 3 units CBG 251 - 300: 5 units CBG 301 - 350: 7 units CBG 351 - 400: 9 units    Provider, Historical, James Bell  nystatin  cream (MYCOSTATIN ) Apply 1 Application topically 2 (two) times daily. Apply to affected area 07/22/23   Patt Alm Macho, James Bell  ondansetron  (ZOFRAN ) 4 MG tablet Take 1 tablet (4 mg total) by mouth daily as needed for nausea or vomiting. 05/24/23   Rilla Baller, James Bell  pramipexole  (MIRAPEX ) 1 MG tablet TAKE 1 TABLET DAILY AT 4 P.M. 09/25/23   Rilla Baller, James Bell  Probiotic Product (PROBIOTIC PO) Take by mouth in the morning and at bedtime. Takes 2 capsules twice a day    Provider, Historical, James Bell  RABEprazole  (ACIPHEX ) 20 MG tablet Take 1 tablet (20 mg total) by mouth daily. 09/08/22   Rilla Baller, James Bell  rosuvastatin  (CRESTOR ) 40 MG tablet TAKE 1 TABLET DAILY 09/13/22   Rilla Baller, James Bell  Semaglutide , 1 MG/DOSE, 4 MG/3ML SOPN Inject 1 mg into the skin once a week. 08/15/23   Rilla Baller, James Bell  sennosides-docusate sodium  (SENOKOT-S) 8.6-50 MG tablet Take 1 tablet by mouth daily as needed for constipation. 08/13/23   Shona Terry SAILOR, DO  sodium chloride  (MURO 128) 5 % ophthalmic solution 1 drop as needed for eye irritation.    Provider, Historical, James Bell  tamsulosin  (FLOMAX ) 0.4 MG CAPS capsule Take 1 capsule (0.4 mg total) by mouth daily after  supper. 08/13/23   Shona Terry SAILOR, DO  vitamin C (ASCORBIC ACID ) 500 MG  tablet Take 1,000 mg by mouth daily as needed.    Provider, Historical, James Bell    Physical Exam: Today's Vitals   09/30/23 1713 09/30/23 1720  BP:  (!) 126/57  Pulse:  (!) 102  Resp:  18  Temp:  98 F (36.7 C)  TempSrc:  Oral  Weight: 77.3 kg 77.3 kg  Height: 5' 8 (1.727 m) 5' 8 (1.727 m)   Body mass index is 25.91 kg/m.  Physical Exam Vitals and nursing note reviewed.  Constitutional:      General: He is awake. He is not in acute distress.    Appearance: Normal appearance. He is ill-appearing.  HENT:     Head: Normocephalic.     Nose: No rhinorrhea.     Mouth/Throat:     Mouth: Mucous membranes are dry.  Eyes:     General: No scleral icterus.    Pupils: Pupils are equal, round, and reactive to light.  Neck:     Vascular: No JVD.  Cardiovascular:     Rate and Rhythm: Normal rate and regular rhythm.  Pulmonary:     Effort: Pulmonary effort is normal.     Breath sounds: Normal breath sounds. No wheezing, rhonchi or rales.  Abdominal:     General: Bowel sounds are normal. There is no distension.     Palpations: Abdomen is soft.     Tenderness: There is no abdominal tenderness.  Musculoskeletal:     Cervical back: Neck supple.     Right lower leg: No edema.     Left lower leg: No edema.  Skin:    General: Skin is warm and dry.  Neurological:     General: No focal deficit present.     Mental Status: He is alert and oriented to person, place, and time.  Psychiatric:        Mood and Affect: Mood normal.        Behavior: Behavior normal. Behavior is cooperative.     Data Reviewed:  Results are pending, will review when available. 01/02/2022 echocardiogram report. IMPRESSIONS:   1. Left ventricular ejection fraction, by estimation, is 55 to 60%. The  left ventricle has normal function. The left ventricle has no regional  wall motion abnormalities. Left ventricular diastolic parameters are   indeterminate.   2. Right ventricular systolic function is normal. The right ventricular  size is normal.   3. The mitral valve is normal in structure. No evidence of mitral valve  regurgitation. No evidence of mitral stenosis.   4. The aortic valve is calcified. Aortic valve regurgitation is not  visualized. Aortic valve sclerosis/calcification is present, without any  evidence of aortic stenosis.   5. The inferior vena cava is normal in size with <50% respiratory  variability, suggesting right atrial pressure of 8 mmHg.   EKG: Vent. rate 115 BPM PR interval 158 ms QRS duration 94 ms QT/QTcB 312/431 ms P-R-T axes 74 -67 102 Sinus tachycardia Left axis deviation ST & T wave abnormality, consider lateral ischemia Abnormal ECG  Assessment and Plan: Principal Problem:   Chronic kidney disease, stage 3b (HCC) With superimposed:   Acute kidney injury (HCC) Observation/telemetry. Continue IV fluids. Avoid hypotension. Avoid nephrotoxins. Monitor intake and output. Monitor renal function electrolytes.  Active Problems:   UTI (urinary tract infection) Continue meropenem  per pharmacy. Follow-up urine culture and sensitivity.    Chronic diastolic CHF (congestive heart failure) (HCC) Currently volume depleted.  1 Continue levothyroxine  50 mcg p.o. daily.    Restless leg  syndrome, severe Continue Mirapex  1 mg p.o. in the evenings.    BPH with obstruction/lower urinary tract symptoms Continue tamsulosin  0.4 mg p.o. daily.    OSA on CPAP Continue CPAP at bedtime.    Type 2 diabetes mellitus with hyperglycemia (HCC) Carbohydrate modified diet. CBG monitoring with RI SS. Last A1c 6.7% 7 months ago. Check hemoglobin A1c.    GERD (gastroesophageal reflux disease) Continue rabeprazole  or formulary equivalent.    Glaucoma Continue Combigan  drops. Follow-up with ophthalmology as an outpatient.    Advance Care Planning:   Code Status: Limited: Do not attempt  resuscitation (DNR) -DNR-LIMITED -Do Not Intubate/DNI    Consults:   Family Communication:   Severity of Illness: The appropriate patient status for this patient is INPATIENT. Inpatient status is judged to be reasonable and necessary in order to provide the required intensity of service to ensure the patient's safety. The patient's presenting symptoms, physical exam findings, and initial radiographic and laboratory data in the context of their chronic comorbidities is felt to place them at high risk for further clinical deterioration. Furthermore, it is not anticipated that the patient will be medically stable for discharge from the hospital within 2 midnights of admission.   * I certify that at the point of admission it is my clinical judgment that the patient will require inpatient hospital care spanning beyond 2 midnights from the point of admission due to high intensity of service, high risk for further deterioration and high frequency of surveillance required.*  Author: Alm Dorn Castor, James Bell 09/30/2023 4:53 PM  For on call review [email or web address].   This document was prepared using Dragon voice recognition software and may contain some unintended transcription errors.

## 2023-09-30 NOTE — Progress Notes (Signed)
 PHARMACY NOTE:  ANTIMICROBIAL RENAL DOSAGE ADJUSTMENT  Current antimicrobial regimen includes a mismatch between antimicrobial dosage and estimated renal function.  As per policy approved by the Pharmacy & Therapeutics and Medical Executive Committees, the antimicrobial dosage will be adjusted accordingly.  Current antimicrobial dosage:  Meropenem  1 g IV q8h  Indication: UTI  Renal Function:  Estimated Creatinine Clearance: 19.8 mL/min (A) (by C-G formula based on SCr of 2.4 mg/dL (H)).    Antimicrobial dosage has been changed to:  Meropenem  500 mg IV q12h  Thank you for allowing pharmacy to be a part of this patient's care.  Marolyn KATHEE Mare, [facility] 09/30/2023 1:44 PM

## 2023-09-30 NOTE — ED Notes (Signed)
 Pt is more alert. Resting with eyes closed. Daughter at bedside.

## 2023-09-30 NOTE — ED Triage Notes (Signed)
 Per EMS, Pt, from home, c/o generalized weakness starting this morning.  Denies pain and n/v/d.    Pt was discharged from hospital last week after being admitted for COVID and a UTI.

## 2023-09-30 NOTE — Progress Notes (Addendum)
 89 yo just dc'd to [facility] 1/7 after admission for COVID (was positive 1/2 so no longer requiring isolation) and UTI which was cx negative.. Has done poorly since dc-poor intake, gen malaise and dizziness when standing. UA looks dirty and could be from DH-he did have a proteus UTI in Dec that was pan S except for I to Rocephin  ans R to macrodantin. I asked EDP to give Meropenem . WBC 14,000. Has AKI on CKD3 (baseline cr 1.5-1.7 with current Cr 2.4) -EDP gave 500 cc bolus-I requested maintenance fluids at 75 cc. Requested Med surg bed -DNR

## 2023-09-30 NOTE — ED Notes (Signed)
 Messaged pharmacy for meropenam. Daughter requesting med for pt restless legs. EDP informed. Pt is sleeping.

## 2023-09-30 NOTE — ED Provider Notes (Signed)
 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 intake.  Denies any vomiting.  Does admit to having decreased appetite.  Has had significant difficulty with ambulation due to feeling like he is about to pass out whenever he stands up over the past 24 hours.     Physical Exam   Triage Vital Signs: ED Triage Vitals  Encounter Vitals Group     BP 09/30/23 0825 (!) 140/72     Systolic BP Percentile --      Diastolic BP Percentile --      Pulse Rate 09/30/23 0825 (!) 113     Resp 09/30/23 0825 18     Temp 09/30/23 0825 98.5 F (36.9 C)     Temp Source 09/30/23 0825 Oral     SpO2 09/30/23 0822 94 %     Weight 09/30/23 0827 168 lb (76.2 kg)     Height 09/30/23 0827 5' 8 (1.727 m)     Head Circumference --      Peak Flow --      Pain Score 09/30/23 0827 0     Pain Loc --      Pain Education --      Exclude from Growth Chart --     Most recent vital signs: Vitals:   09/30/23 1107 09/30/23 1306  BP: 124/68   Pulse: (!) 108   Resp: (!) 21   Temp:  97.7 F (36.5 C)  SpO2: 95%      Constitutional: Alert, ill appearing Eyes: Conjunctivae are normal.  Head: Atraumatic. Nose: No congestion/rhinnorhea. Mouth/Throat: Mucous membranes are dry   Neck: Painless ROM.  Cardiovascular:   Good peripheral circulation. Respiratory: Normal respiratory effort.  No retractions.  Gastrointestinal: Soft and nontender.  Musculoskeletal:  no deformity Neurologic:  MAE spontaneously. No gross focal neurologic deficits are appreciated.  Skin:  Skin is warm, dry and intact. No rash noted.     ED Results / Procedures / Treatments   Labs (all labs ordered are listed, but only abnormal  results are displayed) Labs Reviewed  BASIC METABOLIC PANEL - Abnormal; Notable for the following components:      Result Value   CO2 21 (*)    Glucose, Bld 200 (*)    BUN 41 (*)    Creatinine, Ser 2.40 (*)    Calcium  8.7 (*)    GFR, Estimated 25 (*)    All other components within normal limits  CBC - Abnormal; Notable for the following components:   WBC 14.0 (*)    All other components within normal limits  URINALYSIS, ROUTINE W REFLEX MICROSCOPIC - Abnormal; Notable for the following components:   Color, Urine YELLOW (*)    APPearance TURBID (*)    Glucose, UA 50 (*)    Hgb urine dipstick SMALL (*)    Protein, ur 100 (*)    Leukocytes,Ua LARGE (*)    Bacteria, UA FEW (*)    All other components within normal limits  CBG MONITORING, ED - Abnormal; Notable for the following components:   Glucose-Capillary 196 (*)    All other components within normal limits  URINE CULTURE  LACTIC  ACID, PLASMA     EKG  ED ECG REPORT I, Belvie Essex, the attending physician, personally viewed and interpreted this ECG.   Date: 09/30/2023  EKG Time: 8:40  Rate: 115  Rhythm: sinus  Axis: left  Intervals: normal  ST&T Change: no stemi, no depressions    RADIOLOGY Please see ED Course for my review and interpretation.  I personally reviewed all radiographic images ordered to evaluate for the above acute complaints and reviewed radiology reports and findings.  These findings were personally discussed with the patient.  Please see medical record for radiology report.    PROCEDURES:  Critical Care performed: No  Procedures   MEDICATIONS ORDERED IN ED: Medications  0.9 %  sodium chloride  infusion (has no administration in time range)  meropenem  (MERREM ) 500 mg in sodium chloride  0.9 % 100 mL IVPB (has no administration in time range)  sodium chloride  0.9 % bolus 500 mL (0 mLs Intravenous Stopped 09/30/23 1145)     IMPRESSION / MDM / ASSESSMENT AND PLAN / ED COURSE  I  reviewed the triage vital signs and the nursing notes.                              Differential diagnosis includes, but is not limited to, sepsis, DKA, AKI, electrolyte abnormality, UTI, post-COVID syndrome, failure to thrive  Patient presenting to the ER for evaluation of symptoms as described above.  Based on symptoms, risk factors and considered above differential, this presenting complaint could reflect a potentially life-threatening illness therefore the patient will be placed on continuous pulse oximetry and telemetry for monitoring.  Laboratory evaluation will be sent to evaluate for the above complaints.      Clinical Course as of 09/30/23 1408  Sun Sep 30, 2023  1116 Chest x-ray on my review and interpretation without evidence of consolidation. [PR]  1343 EKG 12-Lead [JW]  1343 ED EKG [JW]    Clinical Course User Index [JW] Manny Bread, Student-PA [PR] Essex Belvie, MD   Patient with evidence of AKI.  Also with UTI.  Do suspect he is becoming orthostatic at home.  He is requiring 2 person assist which is significant deterioration from his baseline.  Based on presentation do feel he will require hospitalization for IV hydration and further medical workup.  I did order IV antibiotics for him.  Does have a history of resistant Proteus.  Will order Merrem .  Have consulted hospitalist who kindly accepted patient to their service.  FINAL CLINICAL IMPRESSION(S) / ED DIAGNOSES   Final diagnoses:  Weakness  AKI (acute kidney injury) (HCC)  Acute cystitis without hematuria     Rx / DC Orders   ED Discharge Orders     None        Note:  This document was prepared using Dragon voice recognition software and may include unintentional dictation errors.    Essex Belvie, MD 09/30/23 1409

## 2023-09-30 NOTE — Progress Notes (Signed)
 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  Was recently admitted & treated 09/12/2023-09/25/2023 for UTI.  Pharmacy has been consulted for meropenem  dosing.  Plan: Meropenem  500 mg IV every 12 hours for est CrCl 10-25 ml/min Monitor clinical progress, renal function F/U C&S, abx deescalation / LOT   Height: 5' 8 (172.7 cm) Weight: 77.3 kg (170 lb 6.7 oz) IBW/kg (Calculated) : 68.4  Temp (24hrs), Avg:98 F (36.7 C), Min:97.7 F (36.5 C), Max:98.5 F (36.9 C)  Recent Labs  Lab 09/30/23 0837 09/30/23 1048  WBC 14.0*  --   CREATININE 2.40*  --   LATICACIDVEN  --  1.1    Estimated Creatinine Clearance: 19.8 mL/min (A) (by C-G formula based on SCr of 2.4 mg/dL (H)).    Allergies  Allergen Reactions   Dulaglutide     Other reaction(s): upset stomach Other reaction(s): Abdominal Pain, upset stomach Other reaction(s): upset stomach    Empagliflozin Other (See Comments)    Other reaction(s): yeast infection Other reaction(s): Other (See Comments) Other reaction(s): yeast infection    Lisinopril  Cough    tachycardia   Nexium  [Esomeprazole ] Other (See Comments)    Headache   Oxycodone  Other (See Comments)    Even 1/2 tablet of 5mg  caused increased confusion - contributory to hospitalization 10/2020 with AMS   Pregabalin Nausea Only and Other (See Comments)    Other reaction(s): MALAISE, SLIGHT NAUSEA, SLIGHT HEADACHE Other reaction(s): MALAISE, SLIGHT NAUSEA, SLIGHT HEADACHE    Rosuvastatin  Other (See Comments)    Other reaction(s): muscle pain 2.16.2022 Pt is current on this medication without any issues. Other reaction(s): muscle pain 2.16.2022 Pt is current on this medication without any issues. Other reaction(s): muscle pain    Antimicrobials this admission: 1/12 Rocephin  x 1 1/12 meropenem  >>   Microbiology results: 1/12 UCx: sent    Thank you for allowing pharmacy to be a part of this patient's  care.  Eleanor Agent, PharmD, BCPS 09/30/2023 5:50 PM

## 2023-09-30 NOTE — ED Notes (Signed)
 Helped pt into bed. Pt leaning to L side and unable to stand by self or walk. Pt states is weak since 2-3 days and poor appetite. EDP at bedside. Pt was soaked in urine. Brief changed. Recent UTI and covid.

## 2023-09-30 NOTE — ED Notes (Signed)
 Pt resting in bed with eyes closed. Lights dimmed. Door open.

## 2023-09-30 NOTE — ED Notes (Signed)
 EDP at bedside. Daughter requests promofit for pt stating that pt does not use urinal well. Pt was very wet upon arrival also. Promofit will be placed.

## 2023-09-30 NOTE — Progress Notes (Signed)
 HOSPITAL MEDICINE OVERNIGHT EVENT NOTE    Notified by [facility] that James Bell is out of telemetry packs and is requesting for consideration of discontinuation of telemetry order.  Chart reviewed.  Hx of CHF but it is chronic.  Pt. Is here for AKI.  Will change to med surg status.  [name]  MD [facility]

## 2023-10-01 ENCOUNTER — Inpatient Hospital Stay (HOSPITAL_COMMUNITY): Payer: Medicare PPO

## 2023-10-01 DIAGNOSIS — N179 Acute kidney failure, unspecified: Secondary | ICD-10-CM | POA: Diagnosis not present

## 2023-10-01 LAB — CBC
HCT: 43.4 % (ref 39.0–52.0)
Hemoglobin: 14.1 g/dL (ref 13.0–17.0)
MCH: 29.7 pg (ref 26.0–34.0)
MCHC: 32.5 g/dL (ref 30.0–36.0)
MCV: 91.4 fL (ref 80.0–100.0)
Platelets: 197 10*3/uL (ref 150–400)
RBC: 4.75 MIL/uL (ref 4.22–5.81)
RDW: 13.4 % (ref 11.5–15.5)
WBC: 12.8 10*3/uL — ABNORMAL HIGH (ref 4.0–10.5)
nRBC: 0 % (ref 0.0–0.2)

## 2023-10-01 LAB — COMPREHENSIVE METABOLIC PANEL
ALT: 18 U/L (ref 0–44)
AST: 21 U/L (ref 15–41)
Albumin: 2.7 g/dL — ABNORMAL LOW (ref 3.5–5.0)
Alkaline Phosphatase: 50 U/L (ref 38–126)
Anion gap: 10 (ref 5–15)
BUN: 38 mg/dL — ABNORMAL HIGH (ref 8–23)
CO2: 19 mmol/L — ABNORMAL LOW (ref 22–32)
Calcium: 8.2 mg/dL — ABNORMAL LOW (ref 8.9–10.3)
Chloride: 111 mmol/L (ref 98–111)
Creatinine, Ser: 2.19 mg/dL — ABNORMAL HIGH (ref 0.61–1.24)
GFR, Estimated: 28 mL/min — ABNORMAL LOW (ref >60)
Glucose, Bld: 146 mg/dL — ABNORMAL HIGH (ref 70–99)
Potassium: 4.3 mmol/L (ref 3.5–5.1)
Sodium: 140 mmol/L (ref 135–145)
Total Bilirubin: 0.7 mg/dL (ref 0.0–1.2)
Total Protein: 7.1 g/dL (ref 6.5–8.1)

## 2023-10-01 LAB — RESPIRATORY PANEL BY PCR

## 2023-10-01 LAB — GLUCOSE, CAPILLARY
Glucose-Capillary: 125 mg/dL — ABNORMAL HIGH (ref 70–99)
Glucose-Capillary: 212 mg/dL — ABNORMAL HIGH (ref 70–99)
Glucose-Capillary: 164 mg/dL — ABNORMAL HIGH (ref 70–99)
Glucose-Capillary: 126 mg/dL — ABNORMAL HIGH (ref 70–99)

## 2023-10-01 LAB — URINE CULTURE: Culture: <10000 — AB

## 2023-10-01 LAB — HEMOGLOBIN A1C
Hgb A1c MFr Bld: 6.8 % — ABNORMAL HIGH (ref 4.8–5.6)
Mean Plasma Glucose: 148.46 mg/dL

## 2023-10-01 MED ORDER — PIPERACILLIN-TAZOBACTAM 3.375 G IVPB
3.3750 g | Freq: Three times a day (TID) | INTRAVENOUS | Status: DC
  Administered 2023-10-01 – 2023-10-02 (×3): 3.375 g via INTRAVENOUS
  Filled 2023-10-01 (×3): qty 50

## 2023-10-01 MED ORDER — SODIUM CHLORIDE 0.9 % IV SOLN: INTRAVENOUS | Status: DC

## 2023-10-01 MED ORDER — DEXTROSE-SODIUM CHLORIDE 5-0.45 % IV SOLN
INTRAVENOUS | Status: DC
Start: 2023-10-01 — End: 2023-10-01

## 2023-10-01 MED ORDER — GUAIFENESIN-DM 100-10 MG/5ML PO SYRP
5.0000 mL | ORAL_SOLUTION | ORAL | Status: DC | PRN
  Administered 2023-10-01 (×2): 5 mL via ORAL
  Filled 2023-10-01 (×2): qty 5

## 2023-10-01 NOTE — Plan of Care (Signed)
  Problem: SLP Dysphagia Goals Goal: Misc Dysphagia Goal Flowsheets (Taken 10/01/2023 1132) Misc Dysphagia Goal: Pt will complete MBSS to further determine diet recommendations and swallowing goals as indicated

## 2023-10-01 NOTE — Evaluation (Signed)
 Clinical/Bedside Swallow Evaluation Patient Details  Name: James CYR, MD MRN: [id number] Date of Birth: 1931-05-16  Today's Date: 10/01/2023 Time: SLP Start Time (ACUTE ONLY): 1050 SLP Stop Time (ACUTE ONLY): 1105 SLP Time Calculation (min) (ACUTE ONLY): 15 min  Past Medical History:  Past Medical History:  Diagnosis Date   Anemia    Basal cell carcinoma 2018   R temple    Basal cell carcinoma of nose 2002   bridge of nose   BPH with obstruction/lower urinary tract symptoms    failed flomax , uroxatral, myrbetriq  - sees urology Macky) pending videourodynamics   CKD (chronic kidney disease) stage 3, GFR 30-59 ml/min (HCC) 03/11/2012   Renal US  02/2014 - multiple kidney cysts    CVA (cerebral infarction) 03/07/2012   slurred speech; right hemplegia, left handed - completed PT 07/2014 (17 sessions)   Dehydration    Depression    mild, on cymbalta  per prior PCP   Erectile dysfunction    per urology (prostaglandin injection)   GERD (gastroesophageal reflux disease)    with sliding HH   H/O hiatal hernia    History of kidney problems 12/2010   lacerated left kidney after fall   HLD (hyperlipidemia)    Hx of basal cell carcinoma 07/10/2017   R. lat. forehead near hair line   Kidney cysts 02/2014   bilateral (renal US )   Migraines 03/11/2012   now rare   OSA on CPAP    sleep study 01/2014 - severe, CPAP at 10, previously on nuvigil (Dr. Roz Mar)   Palpitations 01/2012   holter WNL, rare PAC/PVCs   Presbycusis of both ears 2016   rec amplification [facility])   Restless leg syndrome    well controlled on mirapex    Rosacea    [place]   TIA (transient ischemic attack)    Type 2 diabetes, controlled, with neuropathy (HCC) 2011   Dr Faythe in [facility]   Past Surgical History:  Past Surgical History:  Procedure Laterality Date   ABI  09/18/2005   WNL   AMPUTATION TOE Left 03/02/2023   Procedure: 71174 - TOE INTERPHALANGEAL JOINT - SECOND;  Surgeon: Lennie Barter,  DPM;  Location: [facility] ORS;  Service: Podiatry;  Laterality: Left;   CARDIAC CATHETERIZATION  09/18/2002   normal; [place] [facility]   CARDIOVASCULAR STRESS TEST  02/16/2014   WNL, low risk scan, EF 68%   CAROTID ENDARTERECTOMY     pt denies 02/27/23   CATARACT EXTRACTION W/ INTRAOCULAR LENS IMPLANT  11//2012 - 08/2011   COLONOSCOPY  03/19/2011   poor prep, unable to biopsy polyp in tic fold, rpt 3 mo Jayne)   COLONOSCOPY  06/19/2011   mult polyps adenomatous, severe diverticulosis, rpt 3 yrs Jayne)   CYSTOSCOPY WITH URETHRAL DILATATION N/A 07/24/2013   Procedure: CYSTOSCOPY WITH URETHRAL DILATATION;  Surgeon: Arlena LILLETTE Gal, MD;  Location: [facility] ORS;  Service: Urology;  Laterality: N/A;   ESOPHAGOGASTRODUODENOSCOPY  09/18/2004   duodenitis, medual HH, Grade 2 reflux, mild gastritis   ESOPHAGOGASTRODUODENOSCOPY  07/20/2011   mild chronic gastritis   HEMORRHOID SURGERY  09/18/1989   INGUINAL HERNIA REPAIR  1981; 2002   left, then repaired   PROSTATE SURGERY  02/16/2009   PVP (laser)   SKIN CANCER EXCISION  09/18/2000   bridge of nose, [facility]   TONSILLECTOMY AND ADENOIDECTOMY  ~ 1945   TRANSURETHRAL INCISION OF PROSTATE N/A 07/24/2013   Procedure: TRANSURETHRAL INCISION OF THE PROSTATE (TUIP);  Surgeon: Arlena LILLETTE Gal, MD;  Location: [facility] ORS;  Service: Urology;  Laterality: N/A;   URETHRAL DILATION  09/18/2012   tannenbaum   HPI:  AKI; hx of GERD and hiatal hernia    Assessment / Plan / Recommendation  Clinical Impression   Pt presents with a concern for pharyngeal dysphagia and possible esophageal dysphagia per clinical swallow assessment completed today.  Pt lethargic though about to follow simple commands. Oral phase judged to be [facility] for consistencies tried. Pt did decline solids; therefore, unable to assess mastication efforts of solids. Pharyngeal swallow initiation appeared prompt with laryngeal elevation noted. Observed delayed regurgitant sounds + wet coughing episodes  following sips of thin water via cup and sips of Ensure (slightly thick) via open bottle. No overt or subtle s/s of aspiration noted with applesauce. Pt does have a hx of GERD and a hiatal hernia. Last EGD completed on 07/25/11 per EMR without dilation  Recommend MBSS to objective assess oropharyngeal swallow function and screen esophageal phase of swallowing. MBSS anticipated later this date. Recommend NPO with exception of meds crushed in applesauce and ice chips for oral comfort until MBSS can be completed.   Plan: MBSS later this date.   SLP Visit Diagnosis: Dysphagia, unspecified (R13.10)    Aspiration Risk  Moderate aspiration risk    Diet Recommendation NPO;Ice chips PRN after oral care    Medication Administration: Crushed with puree    Other  Recommendations Oral Care Recommendations: Oral care QID    Recommendations for follow up therapy are one component of a multi-disciplinary discharge planning process, led by the attending physician.  Recommendations may be updated based on patient status, additional functional criteria and insurance authorization.  Follow up Recommendations Follow physician's recommendations for discharge plan and follow up therapies      Assistance Recommended at Discharge  TBD  Functional Status Assessment Patient has had a recent decline in their functional status and demonstrates the ability to make significant improvements in function in a reasonable and predictable amount of time.  Frequency and Duration min 1 x/week  1 week       Prognosis Prognosis for improved oropharyngeal function: Fair      Swallow Study   General Date of Onset: 10/01/23 HPI: AKI; hx of GERD and hiatal hernia Type of Study: Bedside Swallow Evaluation Previous Swallow Assessment: CSE 11/2021- recommendation for regular diet and thin liquids Diet Prior to this Study: Regular;Thin liquids (Level 0) Temperature Spikes Noted: No Respiratory Status: Room air History of  Recent Intubation: No Behavior/Cognition: Requires cueing;Cooperative;Lethargic/Drowsy Oral Cavity Assessment: Dry Oral Cavity - Dentition: Adequate natural dentition Self-Feeding Abilities: Total assist Patient Positioning: Upright in bed Baseline Vocal Quality:  (weak) Volitional Cough: Cognitively unable to elicit    Oral/Motor/Sensory Function Overall Oral Motor/Sensory Function: Within functional limits   Ice Chips Ice chips: Not tested   Thin Liquid Thin Liquid: Impaired Presentation: Cup;Straw Pharyngeal  Phase Impairments: Cough - Delayed    Nectar Thick Nectar Thick Liquid: Impaired (Ensure- slightly thick) Pharyngeal Phase Impairments: Cough - Delayed;Cough - Immediate   Honey Thick Honey Thick Liquid: Not tested   Puree Puree: Within functional limits Presentation: Spoon   Solid     Solid:  (pt declined solid)      Peyton JINNY Rummer 10/01/2023,12:09 PM

## 2023-10-01 NOTE — Procedures (Addendum)
 Modified Barium Swallow Study  Patient Details  Name: James ELLERY, MD MRN: [id number] Date of Birth: 1930-12-09  Today's Date: 10/01/2023  Modified Barium Swallow completed.  Full report located under Chart Review in the Imaging Section.  History of Present Illness Patient is a 89 y.o. male with PMH: GERD, hiatal hernia, anemia, basal cell carcinoma, BPH, CKD, CVA (resultant dysphagia) , dehydration, depression, HLD, bilateral renal cysts, migraine headaches, OSA on CPAP, palpitations, presbycusis of both ears, restless leg syndrome, rosacea, TIA, type 2 diabetes, diabetic peripheral neuropathy. He was recently admitted at [facility] for 12 days due to Covid-19 and UTI (discharged home five days prior to current admission). He presented to [facility] via EMS due to decreased mentation and generalized weakness.   Clinical Impression Patient presents with what appears to be primarily a sensory-based oropharyngeal dysphagia in addition to a secondary motor-based dysphagia. Of note, based on comparision of MBS documentation s/p CVA while patient was in CIR in 2013, his current presentation is very similar. During today's MBS, patient exhibited delayed anterior to posterior transit of solids and liquids and delayed mastication. Swallow initiation was delayed at level of pyriform sinus with thin and nectar thick liquids and delayed at level of vallecular sinus with honey, puree and mechanical soft solids. Anterior hyoid excursion and laryngeal elevation were partial in completion. Epiglottic inversion and laryngeal vestibule closure were complete, however both were significantly delayed. Silent aspiration (PAS 8) occured before the swallow with thin liquids with bolus transiting into open airway. Chin tuck posture did not consistently decrease or limit frequency or amount of aspiration. With head in neutral position, patient exhibited penetration to the cords and aspiration just below vocal cords with nectar  thick liquids. Chin tuck was effective to prevent penetration and aspiration of nectar thick liquids. Patient was able to demonstrate consistent and effective performance of chin tuck, as he had previously been taught to do it. No penetration or aspiration observed with honey thick liquids. Diffuse residuals observed with liquid barium consistencies, however amount was mild and no aspiration observed from pharyngeal residuals. With puree and mechanical soft solids, a moderate amount of bolus remained in oropharynx and vallecular sinus s/p initial swallow, requiring patient to swallow a second time to clear. SLP discussed results and recommendations with patient after study was completed and he demonstrated understanding. SLP is recommending Dys 2 (minced) solids, nectar thick liquids with chin tuck posture when drinking liquids. In addition, patient may have plain, thin water in between meals and after oral care, with chin tuck posture recommended for all swallows.  Factors that may increase risk of adverse event in presence of aspiration Noe & Lianne 2021): Frail or deconditioned;Limited mobility  Swallow Evaluation Recommendations Recommendations: PO diet PO Diet Recommendation: Dysphagia 2 (Finely chopped);Mildly thick liquids (Level 2, nectar thick) Liquid Administration via: Cup;No straw Medication Administration: Other (Comment) (crush if large, whole if small in puree) Supervision: Patient able to self-feed;Intermittent supervision/cueing for swallowing strategies Swallowing strategies  : Follow solids with liquids;Small bites/sips;Slow rate;Chin tuck Postural changes: Position pt fully upright for meals;Stay upright 30-60 min after meals Oral care recommendations: Oral care BID (2x/day)     Norleen IVAR Blase, MA, CCC-SLP Speech Therapy

## 2023-10-01 NOTE — Progress Notes (Signed)
 OT Cancellation Note  Patient Details Name: James SHADDOCK, MD MRN: [id number] DOB: April 02, 1931   Cancelled Treatment:    Reason Eval/Treat Not Completed: Patient at procedure or test/ unavailable Patient is off hall for swallow testing. OT to continue to follow and check back as schedule will allow.  Geofm LEYLAND, MS Acute Rehabilitation Department Office# [phone]  10/01/2023, 3:13 PM

## 2023-10-01 NOTE — Progress Notes (Signed)
 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 Pt was recently admitted and treated with ciprofloxacin  for UTI through 09/22/23. Patient was started on meropenem  on admission for UTI. After reviewing previous cultures, narrowing to piperacillin /tazobactam.   Today, 10/01/23 WBC slightly elevated SCr elevated from baseline, but improving. CrCl 22 mL/min Tmax 101.5  Plan: Stop meropenem  Initiate piperacillin /tazobactam 3.375 g IV q8h EI Monitor renal function  Height: 5' 8 (172.7 cm) Weight: 77.3 kg (170 lb 6.7 oz) IBW/kg (Calculated) : 68.4  Temp (24hrs), Avg:98.3 F (36.8 C), Min:96.9 F (36.1 C), Max:101.5 F (38.6 C)  Recent Labs  Lab 09/30/23 0837 09/30/23 1048 10/01/23 0423  WBC 14.0*  --  12.8*  CREATININE 2.40*  --  2.19*  LATICACIDVEN  --  1.1  --     Estimated Creatinine Clearance: 21.7 mL/min (A) (by C-G formula based on SCr of 2.19 mg/dL (H)).    Allergies  Allergen Reactions   Dulaglutide     Abdominal Pain, upset stomach     Empagliflozin Other (See Comments)    yeast infection   Lisinopril  Cough    tachycardia   Nexium  [Esomeprazole ] Other (See Comments)    Headache   Oxycodone  Other (See Comments)    Even 1/2 tablet of 5mg  caused increased confusion - contributory to hospitalization 10/2020 with AMS   Pregabalin Nausea Only and Other (See Comments)     MALAISE, SLIGHT NAUSEA, SLIGHT HEADACHE     Rosuvastatin  Other (See Comments)     muscle pain 2.16.2022 Pt is current on this medication without any issues.    Antimicrobials this admission: meropenem  1/12 >> 1/13 Piperacillin nadine 1/13 >>   Microbiology results: 1/13 BCx: Sent  Ronal CHRISTELLA Rav, PharmD 10/01/2023 1:37 PM

## 2023-10-01 NOTE — Progress Notes (Addendum)
 PROGRESS NOTE    James GORMAN Fearing, MD  FMW:[id number] DOB: 26-Mar-1931 DOA: 09/30/2023 PCP: Rilla Baller, MD  Outpatient Specialists: urology    Brief Narrative:   From admission h and p  James GORMAN Fearing, MD is a 89 y.o. male with medical history significant of anemia, basal cell carcinoma, BPH, CKD, CVA, dehydration, depression, erectile dysfunction, GERD, hiatal hernia, hyperlipidemia, bilateral renal cysts, migraine headaches, OSA on CPAP, palpitations, presbycusis of both ears, restless leg syndrome, rosacea, TIA, type 2 diabetes, diabetic peripheral neuropathy, stage IIIb CKD who was recently discharged from [facility] where he was admitted for 12 days due to COVID-19 and urinary tract infection, discharged home 5 days ago who arrived from home to the emergency department via EMS due to decreased mentation and generalized weakness since early morning.  He is still showing some encephalopathy and is febrile at the moment.  History is taken from the patient's daughter Asberry, who is also his POA.   Assessment & Plan:   Principal Problem:   Acute kidney injury (HCC) Active Problems:   UTI (urinary tract infection)   Chronic diastolic CHF (congestive heart failure) (HCC)   Chronic kidney disease, stage 3b (HCC)   Hypothyroidism   Restless leg syndrome, severe   BPH with obstruction/lower urinary tract symptoms   OSA on CPAP   Type 2 diabetes mellitus with hyperglycemia (HCC)   GERD (gastroesophageal reflux disease)   Glaucoma  # Fever # Recurrent UTI Recent treatment for uti, here febrile, urinalysis suggestive of infection. Main presenting symptoms generalized weakness and confusion. No respiratory symptoms and cxr clear. Hx vre, most recent culture grew morganella and proteus. Bladder scan today no retention - check rvp - f/u urine culture - check blood cultures, this will be after IV abx were started - switch abx to zosyn  based on prior cultures, stop meropenem   # AKI  on ckd 3 Baseline cr around 1.5 here 2.19 - bladder scan to ensure not retaining - IVF  # BPH - bladder scan as above - cont home flomax   # Hx CVA - home plavix   # T2DM No sig glucose elevation - SSI  # Hypothyroid - home synthroid   # GAD # Chronic pain - home gabapentin , duloxetine   # Restless leg - home pramipex  # Dispo Resides at [name] 24/7 care - PT consult    DVT prophylaxis: heparin  Code Status: dnr Family Communication: daughter updated telephonically 1/13  Level of care: Med-Surg Status is: Inpatient Remains inpatient appropriate because: severity of illness    Consultants:  none  Procedures: none  Antimicrobials:  Meropenem >zosyn     Subjective: No complaints  Objective: Vitals:   10/01/23 0048 10/01/23 0506 10/01/23 0820 10/01/23 1205  BP: 126/67 129/63 (!) 106/54 (!) 142/59  Pulse: 91 93 97 95  Resp: 18 19 18 18   Temp: 98.8 F (37.1 C) 98.2 F (36.8 C) 98 F (36.7 C) (!) 96.9 F (36.1 C)  TempSrc:      SpO2: 96% 97% 95% 95%  Weight:      Height:        Intake/Output Summary (Last 24 hours) at 10/01/2023 1237 Last data filed at 10/01/2023 0845 Gross per 24 hour  Intake 220 ml  Output 900 ml  Net -680 ml   Filed Weights   09/30/23 1713 09/30/23 1720  Weight: 77.3 kg 77.3 kg    Examination:  General exam: Appears calm and comfortable Respiratory system: Clear to auscultation. Respiratory effort normal. Cardiovascular system: S1 &  S2 heard, RRR.   Gastrointestinal system: Abdomen is nondistended, soft and nontender.   Central nervous system: Alert and oriented to self, moving all 4 Extremities: Symmetric 5 x 5 power. Skin: No rashes, lesions or ulcers Psychiatry: calm, confused     Data Reviewed: I have personally reviewed following labs and imaging studies  CBC: Recent Labs  Lab 09/30/23 0837 10/01/23 0423  WBC 14.0* 12.8*  HGB 15.7 14.1  HCT 48.0 43.4  MCV 88.9 91.4  PLT 248 197   Basic  Metabolic Panel: Recent Labs  Lab 09/30/23 0837 10/01/23 0423  NA 136 140  K 4.1 4.3  CL 104 111  CO2 21* 19*  GLUCOSE 200* 146*  BUN 41* 38*  CREATININE 2.40* 2.19*  CALCIUM  8.7* 8.2*   GFR: Estimated Creatinine Clearance: 21.7 mL/min (A) (by C-G formula based on SCr of 2.19 mg/dL (H)). Liver Function Tests: Recent Labs  Lab 10/01/23 0423  AST 21  ALT 18  ALKPHOS 50  BILITOT 0.7  PROT 7.1  ALBUMIN 2.7*   No results for input(s): LIPASE, AMYLASE in the last 168 hours. No results for input(s): AMMONIA in the last 168 hours. Coagulation Profile: No results for input(s): INR, PROTIME in the last 168 hours. Cardiac Enzymes: No results for input(s): CKTOTAL, CKMB, CKMBINDEX, TROPONINI in the last 168 hours. BNP (last 3 results) Recent Labs    06/22/23 1320  PROBNP 33.0   HbA1C: Recent Labs    10/01/23 0423  HGBA1C 6.8*   CBG: Recent Labs  Lab 09/30/23 0834 09/30/23 1747 09/30/23 2119 10/01/23 0810 10/01/23 1201  GLUCAP 196* 184* 156* 125* 212*   Lipid Profile: No results for input(s): CHOL, HDL, LDLCALC, TRIG, CHOLHDL, LDLDIRECT in the last 72 hours. Thyroid  Function Tests: No results for input(s): TSH, T4TOTAL, FREET4, T3FREE, THYROIDAB in the last 72 hours. Anemia Panel: No results for input(s): VITAMINB12, FOLATE, FERRITIN, TIBC, IRON, RETICCTPCT in the last 72 hours. Urine analysis:    Component Value Date/Time   COLORURINE YELLOW (A) 09/30/2023 1227   APPEARANCEUR TURBID (A) 09/30/2023 1227   APPEARANCEUR Cloudy (A) 09/07/2023 1339   LABSPEC 1.013 09/30/2023 1227   PHURINE 5.0 09/30/2023 1227   GLUCOSEU 50 (A) 09/30/2023 1227   GLUCOSEU >=1000 (A) 10/17/2021 1509   HGBUR SMALL (A) 09/30/2023 1227   BILIRUBINUR NEGATIVE 09/30/2023 1227   BILIRUBINUR Negative 09/07/2023 1339   KETONESUR NEGATIVE 09/30/2023 1227   PROTEINUR 100 (A) 09/30/2023 1227   UROBILINOGEN 0.2 06/22/2023 1307    UROBILINOGEN 0.2 10/17/2021 1509   NITRITE NEGATIVE 09/30/2023 1227   LEUKOCYTESUR LARGE (A) 09/30/2023 1227   Sepsis Labs: @LABRCNTIP (procalcitonin:4,lacticidven:4)  )No results found for this or any previous visit (from the past 240 hours).       Radiology Studies: CT HEAD WO CONTRAST ( ) Result Date: 09/30/2023 CLINICAL DATA:  Mental status change.  Unknown cause. EXAM: CT HEAD WITHOUT CONTRAST TECHNIQUE: Contiguous axial images were obtained from the base of the skull through the vertex without intravenous contrast. RADIATION DOSE REDUCTION: This exam was performed according to the departmental dose-optimization program which includes automated exposure control, adjustment of the mA and/or kV according to patient size and/or use of iterative reconstruction technique. COMPARISON:  MR head without contrast 09/13/2023. FINDINGS: Brain: Mild atrophy and white matter changes are stable. Remote infarct involving the posterior left lentiform nucleus is again noted. Acute infarct or hemorrhage is present. The ventricles are proportionate to the degree of atrophy. Ex vacuo dilation of the atrium of the left  lateral ventricle is noted. No significant extraaxial fluid collection is present. The brainstem and cerebellum are within normal limits. Midline structures are within normal limits. Vascular: Atherosclerotic calcifications are present within the cavernous internal carotid arteries bilaterally. No hyperdense vessel is present. Skull: Calvarium is intact. No focal lytic or blastic lesions are present. No significant extracranial soft tissue lesion is present. Sinuses/Orbits: Bilateral lens replacements are noted. Globes and orbits are otherwise unremarkable. Bilateral lens replacements are noted. Globes and orbits are otherwise unremarkable. IMPRESSION: 1. No acute intracranial abnormality or significant interval change. 2. Stable atrophy and white matter disease. This likely reflects the sequela of  chronic microvascular ischemia. 3. Remote infarct involving the posterior left lentiform nucleus. Electronically Signed   By: Lonni Necessary M.D.   On: 09/30/2023 12:17   DG Chest Portable 1 View Result Date: 09/30/2023 CLINICAL DATA:  Acute kidney injury.  COVID infection. EXAM: PORTABLE CHEST 1 VIEW COMPARISON:  09/15/2023 FINDINGS: The heart size and mediastinal contours are within normal limits. Both lungs are clear. The visualized skeletal structures are unremarkable. IMPRESSION: No active disease. Electronically Signed   By: Norleen DELENA Kil M.D.   On: 09/30/2023 10:58        Scheduled Meds:  brimonidine   1 drop Both Eyes BID   clopidogrel   75 mg Oral Daily   DULoxetine   60 mg Oral Daily   famotidine   20 mg Oral QHS   feeding supplement  237 mL Oral BID BM   gabapentin   300 mg Oral TID   heparin  injection (subcutaneous)  5,000 Units Subcutaneous Q8H   insulin  aspart  0-9 Units Subcutaneous TID WC   levothyroxine   50 mcg Oral Q0600   pantoprazole   40 mg Oral Daily   pramipexole   1 mg Oral QAC supper   rosuvastatin   40 mg Oral Daily   tamsulosin   0.4 mg Oral QPC supper   timolol   1 drop Both Eyes BID   Continuous Infusions:  meropenem  (MERREM ) IV 500 mg (10/01/23 0922)     LOS: 1 day     Devaughn KATHEE Ban, MD [facility]   If 7PM-7AM, please contact night-coverage [email or web address] Password [facility] 10/01/2023, 12:37 PM

## 2023-10-02 DIAGNOSIS — G9341 Metabolic encephalopathy: Secondary | ICD-10-CM

## 2023-10-02 LAB — CBC
HCT: 52.9 % — ABNORMAL HIGH (ref 39.0–52.0)
Hemoglobin: 17.4 g/dL — ABNORMAL HIGH (ref 13.0–17.0)
MCH: 29.5 pg (ref 26.0–34.0)
MCHC: 32.9 g/dL (ref 30.0–36.0)
MCV: 89.8 fL (ref 80.0–100.0)
Platelets: 144 10*3/uL — ABNORMAL LOW (ref 150–400)
RBC: 5.89 MIL/uL — ABNORMAL HIGH (ref 4.22–5.81)
RDW: 13.5 % (ref 11.5–15.5)
WBC: 10 10*3/uL (ref 4.0–10.5)
nRBC: 0 % (ref 0.0–0.2)

## 2023-10-02 LAB — BASIC METABOLIC PANEL
Anion gap: 9 (ref 5–15)
BUN: 34 mg/dL — ABNORMAL HIGH (ref 8–23)
CO2: 18 mmol/L — ABNORMAL LOW (ref 22–32)
Calcium: 7.9 mg/dL — ABNORMAL LOW (ref 8.9–10.3)
Chloride: 112 mmol/L — ABNORMAL HIGH (ref 98–111)
Creatinine, Ser: 2.15 mg/dL — ABNORMAL HIGH (ref 0.61–1.24)
GFR, Estimated: 29 mL/min — ABNORMAL LOW (ref >60)
Glucose, Bld: 119 mg/dL — ABNORMAL HIGH (ref 70–99)
Potassium: 3.9 mmol/L (ref 3.5–5.1)
Sodium: 139 mmol/L (ref 135–145)

## 2023-10-02 LAB — GLUCOSE, CAPILLARY
Glucose-Capillary: 96 mg/dL (ref 70–99)
Glucose-Capillary: 114 mg/dL — ABNORMAL HIGH (ref 70–99)
Glucose-Capillary: 130 mg/dL — ABNORMAL HIGH (ref 70–99)
Glucose-Capillary: 160 mg/dL — ABNORMAL HIGH (ref 70–99)

## 2023-10-02 MED ORDER — ROSUVASTATIN CALCIUM 10 MG PO TABS
10.0000 mg | ORAL_TABLET | Freq: Every day | ORAL | Status: DC
  Administered 2023-10-03 – 2023-10-06 (×4): 10 mg via ORAL
  Filled 2023-10-02 (×4): qty 1

## 2023-10-02 MED ORDER — GABAPENTIN 300 MG PO CAPS
300.0000 mg | ORAL_CAPSULE | Freq: Two times a day (BID) | ORAL | Status: DC
  Administered 2023-10-02 – 2023-10-06 (×7): 300 mg via ORAL
  Filled 2023-10-02 (×8): qty 1

## 2023-10-02 MED ORDER — LACTATED RINGERS IV SOLN: INTRAVENOUS | Status: AC

## 2023-10-02 NOTE — Progress Notes (Signed)
 Speech Language Pathology Treatment: Dysphagia  Patient Details Name: James MCELWAIN, MD MRN: [id number] DOB: Oct 19, 1930 Today's Date: 10/02/2023 Time: 8458-8385 SLP Time Calculation (min) (ACUTE ONLY): 33 min  Assessment / Plan / Recommendation Clinical Impression  Pt seen for dysphagia management s/p MBS on 10/01/2023. NT in room trying to get pt more comfortable. Pt was observed consuming thin water - initially without s/s of aspiration. Upon lying pt flat for repositioning, he demonstrated overt coughing - likely due to retention spilling into his airway. His cough is not productive unfortunately. He advises that his voice has changed and is deeper since his stroke. SLP reviewed MBS study results using teach back. Observed pt with po consumption including nectar liquids, orange juice, and thin water. He benefits from verbal and visual cues for adequate chin tuck posture and he is noted to turn his head slightly to the right. He reports head turn slightly right helps his swallowing - given his left basal ganglia CVA, he certainly could have pharyngeal impairments impacting right pharynx. Even with chin tuck posture, he continues with cough post-swallow of liquids. Pt admits this chronicaly occurs since his stroke in 2013. He denies having pneumonias - and given his poor liquid intake even PTA and his recurrent UTIs, advised upon discharge hospital that he drink thin liqiuds - preferrably water due to its pH neutral status. Pt admits he does not enjoy taste of thickened drinks - hydration is paramount. Thick drinks with meals and thin water between meals in hospital advised due to his decreased mobility status increasing his aspiration pna risk in the hospital.    Recommended pt strengthen his cough and expectoration ability - and stay hydrated to keep his secretions thin.   Will follow up next date to initiate RMT to improve cough/swallow musculature strength for airway protection as he reports he  has been weakened from COVID. Pt agreeable to plan.   HPI HPI: Patient is a 89 y.o. male with PMH: GERD, hiatal hernia, anemia, basal cell carcinoma, BPH, CKD, CVA (resultant dysphagia) , dehydration, depression, HLD, bilateral renal cysts, migraine headaches, OSA on CPAP, palpitations, presbycusis of both ears, restless leg syndrome, rosacea, TIA, type 2 diabetes, diabetic peripheral neuropathy. He was recently admitted at [facility] for 12 days due to Covid-19 and UTI (discharged home five days prior to current admission). He presented to [facility] via EMS due to decreased mentation and generalized weakness.  Pt underwent MBS on 10/01/2023 and follow up for dysphagia management indicated.      SLP Plan  Continue with current plan of care      Recommendations for follow up therapy are one component of a multi-disciplinary discharge planning process, led by the attending physician.  Recommendations may be updated based on patient status, additional functional criteria and insurance authorization.    Recommendations  Diet recommendations: Dysphagia 2 (fine chop);Nectar-thick liquid;Other(comment) (water protocol) Medication Administration: Whole meds with puree Supervision: Patient able to self feed Compensations: Slow rate;Small sips/bites;Chin tuck Postural Changes and/or Swallow Maneuvers: Seated upright 90 degrees;Upright 30-60 min after meal;Chin tuck                  Oral care QID     Dysphagia, oropharyngeal phase (R13.12)     Continue with current plan of care    [name], MS [facility] SLP Acute Rehab Services Office [phone]  Nicolas Emmie Caldron  10/02/2023, 7:37 PM

## 2023-10-02 NOTE — Progress Notes (Signed)
 Progress Note   Patient: James KNICK, MD FMW:[id number] DOB: 09-Dec-1930 DOA: 09/30/2023     2 DOS: the patient was seen and examined on 10/02/2023   Brief hospital course: 89yo with h/o HLD, DM, chronic diastolic CHF, CVA, hypothyroidism, dementia, BPH, stage 3b CKD, OSA on CPAP, and recent admission for VRE UTI who presented on 1/12 with AMS.  He was recently admitted from 12/26-1/7 for COVID and UTI with encephalopathy.  He is currently again being treated for UTI with Zosyn  based on recent culture growing Morganella and Proteus.  Current urine and blood cultures are pending.  Assessment and Plan:  Acute metabolic encephalopathy Patient presenting with encephalopathy as evidenced by generalized weakness and some confusion His daughter reports that he does not have underlying dementia, although based on evaluations during prior hospitalization and current hospitalization I have concerns about mild dementia with waxing and waning mental status as well as FTT Evaluation thus far unremarkable other than apparent dehydration with AKI on presentation While initial concern was for recurrent (MDR) UTI, urine culture grew <10k colonies and this seems less likely as the cause; will stop antibiotics and continue to monitor Continue with IVF hydration for an additional 24 hours PT/OT/SLP consults Appears likely to benefit from SNF rehab  H/o UTI (urinary tract infection) Patient recently had VRE-UTI He has been treated with cefuroxime  for Proteus mirabilis UTI recently Initial urinalysis done on 12/26 positive for leukocytes but negative for bacteria  Urine culture from 12/26 with Morganella Morgagni and Proteus Current urine culture with <10 k colonies, does not appear to need ongoing treatment at this time Blood cultures are pending and NTD   AKI on Stage 3b chronic kidney disease Recent baseline creatinine 1.4-1.9 Creatinine 2.4 on presentation, improved to 2.19 -> 2.15 Likely mild  dehydration with AKI on CKD Continue an additional 24 hours of hydration Recheck BMP in AM Avoid nephrotoxic medications   History of CVA (cerebrovascular accident) Continue Crestor  and Plavix  Imaging survey negative for acute CVA   Hyperlipidemia associated with type 2 diabetes mellitus (HCC) Continue Crestor    HTN (hypertension) IV hydralazine  as needed   Type II diabetes mellitus with renal manifestations Recent A1c 6.7, well-controlled - his A1c goal is <8 Patient previously taking NovoLog , semaglutide , Tresiba  80 units daily Hold semglee  for now Sensitive sliding scale Avoid hypoglycemia Continue gabapentin    Chronic diastolic CHF (congestive heart failure)  2D echo on 01/02/2022 showed EF of 55-60% CHF seem to be compensated   Hypothyroidism Continue Synthroid    Restless leg syndrome, severe Continue Pramipexole    BPH with obstruction/lower urinary tract symptoms Continue tamsulosin    OSA Continue CPAP nightly   Glaucoma Continue Combigan    Mood d/o Continue duloxetine    DNR Patient confirmed to be DNR He will need an out of facility DNR form at the time of discharge         Consultants: SLP PT OT TOC team   Procedures: None   Antibiotics: Meropenem  1/12-13 Zosyn  1/13-14  30 Day Unplanned Readmission Risk Score    Flowsheet Row Admission (Current) from 09/30/2023 in [facility]  30 Day Unplanned Readmission Risk Score (%) 30.37 Filed at 10/02/2023 0801       This score is the patient's risk of an unplanned readmission within 30 days of being discharged (0 -100%). The score is based on dignosis, age, lab data, medications, orders, and past utilization.   Low:  0-14.9   Medium: 15-21.9   High: 22-29.9  Extreme: 30 and above           Subjective: Patient reports feeling fatigued, not being at his baseline.  No specific complaints.  I spoke with his daughter.  He went back with 24/7 care and  was doing fine for several days.  The caretaker called his daughter Sunday AM - too weak to get out of bed.  The doctors have said that it was a urinary tract infection again.  He is bad about not drinking, was dehydrated.  He also got very constipated.  His daughter would like for him to go to rehab if possible.   Objective: Vitals:   10/02/23 0550 10/02/23 1132  BP: 112/70 (!) 123/58  Pulse: 76 84  Resp: 18 18  Temp: 97.6 F (36.4 C) 97.6 F (36.4 C)  SpO2: 98% 100%    Intake/Output Summary (Last 24 hours) at 10/02/2023 1515 Last data filed at 10/02/2023 0930 Gross per 24 hour  Intake 1120 ml  Output 800 ml  Net 320 ml   Filed Weights   09/30/23 1713 09/30/23 1720  Weight: 77.3 kg 77.3 kg    Exam:  General:  Appears calm and comfortable and is in NAD, somewhat fatigued Eyes:  EOMI, normal lids, iris ENT:  grossly normal hearing, lips & tongue, mmm Neck:  no LAD, masses or thyromegaly Cardiovascular:  RRR, no m/r/g. No LE edema.  Respiratory:   CTA bilaterally with no wheezes/rales/rhonchi.  Normal respiratory effort. Abdomen:  soft, NT, ND Skin:  no rash or induration seen on limited exam Musculoskeletal:  grossly normal tone BUE/BLE, good ROM, no bony abnormality Psychiatric:  blunted mood and affect, speech fluent and appropriate, AOx3 Neurologic:  CN 2-12 grossly intact, moves all extremities in coordinated fashion  Data Reviewed: I have reviewed the patient's lab results since admission.  Pertinent labs for today include:   CO2 18 Glucose 119 BUN 34/Creatinine 2.15/GFR 29, 38/2.19/28 on 1/13; 41/2.4/25 on 1/12; baseline 1.7/38 WBC 10 Hgb 17.4 RVP negative Blood cultures pending  Urine culture <10k colonies   Family Communication: None present; I spoke with his daughter by telephone  Disposition: Status is: Inpatient Remains inpatient appropriate because: ongoing evaluation     Time spent: 50 minutes  Unresulted Labs (From admission, onward)      Start     Ordered   10/02/23 0500  Basic metabolic panel  Daily,   R     Question:  Specimen collection method  Answer:  Lab=Lab collect   10/01/23 1254   10/02/23 0500  CBC  Daily,   R     Question:  Specimen collection method  Answer:  Lab=Lab collect   10/01/23 1254             Author: Delon Herald, MD 10/02/2023 3:15 PM  For on call review [email or web address].

## 2023-10-02 NOTE — Progress Notes (Signed)
   10/02/23 2315  BiPAP/CPAP/SIPAP  BiPAP/CPAP/SIPAP Pt Type Adult  BiPAP/CPAP/SIPAP Resmed  Mask Type Nasal mask  Mask Size Medium  Respiratory Rate 16 breaths/min  FiO2 (%) 21 %  Patient Home Equipment No  Auto Titrate Yes (<6/>12)  CPAP/SIPAP surface wiped down Yes   Pt. placed on CPAP/Auto for h/s, tolerating well with no c/o.

## 2023-10-02 NOTE — Progress Notes (Signed)
   10/02/23 0000  BiPAP/CPAP/SIPAP  $ Non-Invasive Home Ventilator  Initial  BiPAP/CPAP/SIPAP Pt Type Adult  BiPAP/CPAP/SIPAP Resmed  Mask Type Nasal mask  Mask Size Medium  Respiratory Rate 16 breaths/min  FiO2 (%) 21 %  Patient Home Equipment No  Auto Titrate Yes (<6/>12)  CPAP/SIPAP surface wiped down Yes   Pt. Placed on CPAP Auto(RESMED), tolerating well.

## 2023-10-02 NOTE — Plan of Care (Signed)
  Problem: Education: Goal: Knowledge of General Education information will improve Description: Including pain rating scale, medication(s)/side effects and non-pharmacologic comfort measures Outcome: Progressing   Problem: Health Behavior/Discharge Planning: Goal: Ability to manage health-related needs will improve Outcome: Progressing   Problem: Clinical Measurements: Goal: Ability to maintain clinical measurements within normal limits will improve Outcome: Progressing Goal: Will remain free from infection Outcome: Progressing Goal: Diagnostic test results will improve Outcome: Progressing Goal: Respiratory complications will improve Outcome: Progressing Goal: Cardiovascular complication will be avoided Outcome: Progressing   Problem: Activity: Goal: Risk for activity intolerance will decrease Outcome: Progressing   Problem: Nutrition: Goal: Adequate nutrition will be maintained Outcome: Progressing   Problem: Coping: Goal: Level of anxiety will decrease Outcome: Progressing   Problem: Elimination: Goal: Will not experience complications related to bowel motility Outcome: Progressing Goal: Will not experience complications related to urinary retention Outcome: Progressing   Problem: Pain Management: Goal: General experience of comfort will improve Outcome: Progressing   Problem: Safety: Goal: Ability to remain free from injury will improve Outcome: Progressing   Problem: Skin Integrity: Goal: Risk for impaired skin integrity will decrease Outcome: Progressing   Problem: Education: Goal: Knowledge of risk factors and measures for prevention of condition will improve Outcome: Progressing   Problem: Coping: Goal: Psychosocial and spiritual needs will be supported Outcome: Progressing   Problem: Respiratory: Goal: Will maintain a patent airway Outcome: Progressing Goal: Complications related to the disease process, condition or treatment will be avoided or  minimized Outcome: Progressing   Problem: Education: Goal: Ability to describe self-care measures that may prevent or decrease complications (Diabetes Survival Skills Education) will improve Outcome: Progressing Goal: Individualized Educational Video(s) Outcome: Progressing   Problem: Coping: Goal: Ability to adjust to condition or change in health will improve Outcome: Progressing   Problem: Fluid Volume: Goal: Ability to maintain a balanced intake and output will improve Outcome: Progressing   Problem: Health Behavior/Discharge Planning: Goal: Ability to identify and utilize available resources and services will improve Outcome: Progressing Goal: Ability to manage health-related needs will improve Outcome: Progressing   Problem: Metabolic: Goal: Ability to maintain appropriate glucose levels will improve Outcome: Progressing   Problem: Nutritional: Goal: Maintenance of adequate nutrition will improve Outcome: Progressing Goal: Progress toward achieving an optimal weight will improve Outcome: Progressing   Problem: Skin Integrity: Goal: Risk for impaired skin integrity will decrease Outcome: Progressing   Problem: Tissue Perfusion: Goal: Adequacy of tissue perfusion will improve Outcome: Progressing

## 2023-10-02 NOTE — Hospital Course (Addendum)
 89yo with h/o HLD, DM, chronic diastolic CHF, CVA, hypothyroidism, dementia, BPH, stage 3b CKD, OSA on CPAP, and recent admission for VRE UTI who presented on 1/12 with AMS.  He was recently admitted from 12/26-1/7 for COVID and UTI with encephalopathy.  Persistent aspiration, developed aspiration PNA with worsening encephalopathy.  Palliative care consulted, transitioned to comfort measures.  Anticipate in-hospital death.

## 2023-10-02 NOTE — Consult Note (Signed)
 [facility] Liaison Consult Note   10/02/2023  James GORMAN Fearing, MD 07-Jul-1931 [id number]  Insurance: [employer] Bell PPO   Primary Care Provider: Rilla Baller, MD with [facility] at [facility], this provider is listed for the transition of care follow up appointments  and [facility] follow upcalls   [facility] Liaison screened the patient remotely at [facility].  Admitted with previously of UTI, COVID with a history of HF, now with increased weakness    The patient was screened for 7 and 30 day readmission hospitalization with noted extreme risk score for unplanned readmission risk noted with 4 ED visits and 3 hospital admissions in 6 months.  The patient was assessed for potential Community Care Coordination service needs for post hospital transition for care coordination. Review of patient's electronic medical record reveals patient has been out reached by [facility] RN previously. Patient is from [facility] [place].   Plan: [facility] Liaison will continue to follow progress and disposition to asess for post hospital community care coordination needs.  Referral request for community care coordination: currently anticipate patient to have a Transition of Care follow up with [facility] RN, if returning to community. No other needs assessed at current review.   [facility] Community Care, Population Health does not replace or interfere with any arrangements made by the Inpatient Transition of Care team.   For questions contact:   James Fish, RN, BSN, CCM [place]  [facility], [facility] Liaison Direct Dial: [phone] or secure chat Email: [email or web address]

## 2023-10-02 NOTE — Evaluation (Signed)
 Physical Therapy Evaluation Patient Details Name: James FOUTS, MD MRN: [id number] DOB: 1931/07/26 Today's Date: 10/02/2023  History of Present Illness  89 yo male who was admitted to the hospital on 09/30/2023 due to progressive weakness and change in mentation. Concerning for recurrent UTI with pt recently admitted to [facility] December 2024 secondary to COVID and UTI. November 2024 du eto C-diff and UTI.  Pt found to have AKI and abn labs. Pt PMH includes but is not limited to: anemia, CKD III, CVA, depression, GERD, hernia, HLD, renal cysts, OSA on CPAP, RLS, TIA, DM II, CAD, chronic pain, and hypothyroidism.  Clinical Impression   Pt admitted with above diagnosis.  Pt currently with functional limitations due to the deficits listed below (see PT Problem List). Pt in bed when therapist arrived. Pt agreeable to therapy intervention. Pt required min A for supine to sit and CGA for sit to supine, mod A for initial sitting balance and then able to progress to CGA and dynamic reaching with ADLs, pt required B LE blocked with crocs/shoes donned to scoot laterally EOB to the R, mod A for sit to stand  from EOB. Pt indicated no pain, dizziness or SOB. Pt left in bed all needs in place. Patient will benefit from continued inpatient follow up therapy, <3 hours/day. Pt will benefit from acute skilled PT to increase their independence and safety with mobility to allow discharge.         If plan is discharge home, recommend the following: Assist for transportation;A lot of help with bathing/dressing/bathroom;Help with stairs or ramp for entrance;Two people to help with walking and/or transfers   Can travel by private vehicle   No    Equipment Recommendations None recommended by PT  Recommendations for Other Services       Functional Status Assessment Patient has had a recent decline in their functional status and demonstrates the ability to make significant improvements in function in a reasonable  and predictable amount of time.     Precautions / Restrictions Precautions Precautions: Fall Precaution Comments: watch O2 Restrictions Weight Bearing Restrictions Per Provider Order: No      Mobility  Bed Mobility Overal bed mobility: Needs Assistance Bed Mobility: Supine to Sit     Supine to sit: Min assist Sit to supine: Min assist   General bed mobility comments: with increased time.    Transfers Overall transfer level: Needs assistance Equipment used: 1 person hand held assist Transfers: Sit to/from Stand Sit to Stand: Mod assist           General transfer comment: pt is able to scoot laterally in bed to the R side with shoes donned and B feet bloced wtih CGA, sit to stand from EOB with mod A no AD    Ambulation/Gait               General Gait Details: unable to ambulate at baseline  Stairs            Wheelchair Mobility     Tilt Bed    Modified Rankin (Stroke Patients Only)       Balance Overall balance assessment: Needs assistance Sitting-balance support: Feet supported Sitting balance-Leahy Scale: Fair Sitting balance - Comments: pt requried min A for inital sitting balance and then able to progress to CGA and particpate with dynamic tasks and ADLs seated EOb and no apparent LOB     Standing balance-Leahy Scale: Poor Standing balance comment: mod A for brief standing bout at EOB  Pertinent Vitals/Pain Pain Assessment Pain Assessment: No/denies pain    Home Living Family/patient expects to be discharged to:: Other (Comment) (ILF) Living Arrangements: Alone Available Help at Discharge: Family;Personal care attendant;Available 24 hours/day Type of Home: Apartment Home Access: Level entry       Home Layout: One level Home Equipment: Grab bars - toilet;Grab bars - tub/shower;Tub bench;BSC/3in1;Wheelchair - power Additional Comments: adjustable bed, patient has aid help daily for ADLs.     Prior Function Prior Level of Function : Needs assist             Mobility Comments: MOD I with SPT to/from power wheelchair out of left side of bed (patients left side), to the left, return to bed to the R into L side of bed. Pt reports working with PT and OT at ILF. Fall history. ADLs Comments: MOD I with dressing, feeding, grooming, bathing and toileting with supervision, Pt has meals in cafeteria     Extremity/Trunk Assessment   Upper Extremity Assessment Upper Extremity Assessment: RUE deficits/detail RUE Deficits / Details: patient noted to have two digits with swann neck deformities. grip strength WFL, able to ABduct shoulder to about 60 degrees, unable to FF. patient reported this was baseline for him. noted to have some sublxation    Lower Extremity Assessment Lower Extremity Assessment: Defer to PT evaluation RLE Deficits / Details: hx CVA LLE Deficits / Details: ankle DF 4/5, knee extension 4+/5    Cervical / Trunk Assessment Cervical / Trunk Assessment: Kyphotic  Communication   Communication Communication: No apparent difficulties  Cognition Arousal: Lethargic Behavior During Therapy: WFL for tasks assessed/performed Overall Cognitive Status: Within Functional Limits for tasks assessed                                 General Comments: patient is soft spoken and cooperative, follows multistep directions occational need for increased time for motor processing and planning        General Comments      Exercises     Assessment/Plan    PT Assessment Patient needs continued PT services  PT Problem List Decreased strength;Decreased mobility;Decreased range of motion;Decreased activity tolerance;Decreased balance;Decreased coordination       PT Treatment Interventions DME instruction;Therapeutic exercise;Balance training;Gait training;Stair training;Neuromuscular re-education;Functional mobility training;Therapeutic activities;Patient/family  education    PT Goals (Current goals can be found in the Care Plan section)  Acute Rehab PT Goals Patient Stated Goal: get stronger and go home PT Goal Formulation: With patient Time For Goal Achievement: 10/16/23 Potential to Achieve Goals: Fair    Frequency Min 1X/week     Co-evaluation PT/OT/SLP Co-Evaluation/Treatment: Yes Reason for Co-Treatment: For patient/therapist safety PT goals addressed during session: Mobility/safety with mobility OT goals addressed during session: ADL's and self-care       AM-PAC PT 6 Clicks Mobility  Outcome Measure Help needed turning from your back to your side while in a flat bed without using bedrails?: A Little Help needed moving from lying on your back to sitting on the side of a flat bed without using bedrails?: A Little Help needed moving to and from a bed to a chair (including a wheelchair)?: A Little Help needed standing up from a chair using your arms (e.g., wheelchair or bedside chair)?: A Lot Help needed to walk in hospital room?: Total Help needed climbing 3-5 steps with a railing? : Total 6 Click Score: 13    End of Session  Equipment Utilized During Treatment: Gait belt Activity Tolerance: Patient tolerated treatment well Patient left: in bed;with call bell/phone within reach;with chair alarm set Nurse Communication: Mobility status PT Visit Diagnosis: Other abnormalities of gait and mobility (R26.89);Muscle weakness (generalized) (M62.81);Unsteadiness on feet (R26.81);Difficulty in walking, not elsewhere classified (R26.2) Hemiplegia - Right/Left: Right Hemiplegia - caused by:  (prior stroke)    Time: 1208-1232 PT Time Calculation (min) (ACUTE ONLY): 24 min   Charges:   PT Evaluation $PT Eval Low Complexity: 1 Low PT Treatments $Therapeutic Activity: 8-22 mins PT General Charges $$ ACUTE PT VISIT: 1 Visit         [place], PT Acute Rehab   [place] VEAR Drone 10/02/2023, 5:25 PM

## 2023-10-02 NOTE — Evaluation (Signed)
 Occupational Therapy Evaluation Patient Details Name: James COVELLI, MD MRN: [id number] DOB: 09-17-1931 Today's Date: 10/02/2023   History of Present Illness 89 yo male who was admitted to the hospital on 09/30/2023 due to progressive weakness and change in mentation. Concerning for recurrent UTI with pt recently admitted to [facility] December 2024 secondary to COVID and UTI. November 2024 du eto C-diff and UTI.  Pt found to have AKI and abn labs. Pt PMH includes but is not limited to: anemia, CKD III, CVA, depression, GERD, hernia, HLD, renal cysts, OSA on CPAP, RLS, TIA, DM II, CAD, chronic pain, and hypothyroidism.   Clinical Impression   Patient is a 89 year old male who was admitted for above. Patient was living at ILF at power chair level with caregiver support. Currently, patient is mod A for scooting along bedside with increased time. Patient was lethargic but agreeable with participation in session. Patient was noted to have decreased functional activity tolerance, decreased endurance, decreased sitting balance, decreased safety awareness, and decreased knowledge of AD/AE impacting participation in ADLs. Patient has unclear level of support at ILF. Patient will benefit from continued inpatient follow up therapy, <3 hours/day. Patient would continue to benefit from skilled OT services at this time while admitted and after d/c to address noted deficits in order to improve overall safety and independence in ADLs.         If plan is discharge home, recommend the following: A lot of help with walking and/or transfers;A little help with bathing/dressing/bathroom;Assistance with cooking/housework;Direct supervision/assist for medications management;Direct supervision/assist for financial management;Assist for transportation    Functional Status Assessment  Patient has had a recent decline in their functional status and demonstrates the ability to make significant improvements in function in a  reasonable and predictable amount of time.  Equipment Recommendations          Precautions / Restrictions Precautions Precautions: Fall Precaution Comments: watch O2 Restrictions Weight Bearing Restrictions Per Provider Order: No      Mobility Bed Mobility Overal bed mobility: Needs Assistance Bed Mobility: Supine to Sit     Supine to sit: Min assist Sit to supine: Min assist   General bed mobility comments: with increased time.         Balance Overall balance assessment: Needs assistance Sitting-balance support: No upper extremity supported, Feet supported Sitting balance-Leahy Scale: Good               ADL either performed or assessed with clinical judgement   ADL Overall ADL's : Needs assistance/impaired Eating/Feeding: Set up;Sitting (swallowing issues with Supervison)   Grooming: Wash/dry face;Sitting;Set up Grooming Details (indicate cue type and reason): seated on edge of bed, min A to comb hair with increased time. Upper Body Bathing: Sitting;Moderate assistance   Lower Body Bathing: Maximal assistance;Sit to/from stand   Upper Body Dressing : Bed level;Moderate assistance   Lower Body Dressing: Sitting/lateral leans;Maximal assistance   Toilet Transfer: Moderate assistance Toilet Transfer Details (indicate cue type and reason): to scoot up to [facility]. recliner chair not available in room. nursing currently looking for one. Toileting- Clothing Manipulation and Hygiene: Bed level;Total assistance               Vision Patient Visual Report: No change from baseline              Pertinent Vitals/Pain Pain Assessment Pain Assessment: No/denies pain     Extremity/Trunk Assessment Upper Extremity Assessment Upper Extremity Assessment: RUE deficits/detail RUE Deficits / Details: patient noted  to have two digits with swann neck deformities. grip strength WFL, able to ABduct shoulder to about 60 degrees, unable to FF. patient reported this was  baseline for him. noted to have some sublxation   Lower Extremity Assessment Lower Extremity Assessment: Defer to PT evaluation   Cervical / Trunk Assessment Cervical / Trunk Assessment: Kyphotic   Communication Communication Communication: No apparent difficulties   Cognition Arousal: Lethargic Behavior During Therapy: WFL for tasks assessed/performed Overall Cognitive Status: Within Functional Limits for tasks assessed             General Comments: patient is soft spoken and cooperative.                Home Living Family/patient expects to be discharged to:: Other (Comment) (ILF) Living Arrangements: Alone Available Help at Discharge: Family;Personal care attendant;Available 24 hours/day Type of Home: Apartment Home Access: Level entry     Home Layout: One level     Bathroom Shower/Tub: Tub/shower unit         Home Equipment: Grab bars - toilet;Grab bars - tub/shower;Tub bench;BSC/3in1;Wheelchair - power   Additional Comments: adjustable bed, patient has aid help daily for ADLs.      Prior Functioning/Environment Prior Level of Function : Needs assist             Mobility Comments: MOD I with SPT to/from power wheelchair out of left side of bed (patients left side), to the left, return to bed to the R into L side of bed. Pt reports working with PT and OT at ILF. Fall history. ADLs Comments: MOD I with dressing, feeding, grooming, bathing and toileting with supervision, Pt has meals in cafeteria        OT Problem List: Decreased strength;Decreased activity tolerance;Decreased knowledge of use of DME or AE;Impaired balance (sitting and/or standing);Decreased safety awareness;Decreased cognition      OT Treatment/Interventions: Self-care/ADL training;Therapeutic exercise;Patient/family education;Balance training;Energy conservation;Therapeutic activities;DME and/or AE instruction;Cognitive remediation/compensation    OT Goals(Current goals can be  found in the care plan section) Acute Rehab OT Goals OT Goal Formulation: With patient Time For Goal Achievement: 10/16/23 Potential to Achieve Goals: Fair  OT Frequency: Min 1X/week    Co-evaluation PT/OT/SLP Co-Evaluation/Treatment: Yes Reason for Co-Treatment: For patient/therapist safety PT goals addressed during session: Mobility/safety with mobility OT goals addressed during session: ADL's and self-care      AM-PAC OT 6 Clicks Daily Activity     Outcome Measure Help from another person eating meals?: None Help from another person taking care of personal grooming?: None Help from another person toileting, which includes using toliet, bedpan, or urinal?: A Lot Help from another person bathing (including washing, rinsing, drying)?: A Lot Help from another person to put on and taking off regular upper body clothing?: A Little Help from another person to put on and taking off regular lower body clothing?: A Lot 6 Click Score: 17   End of Session Nurse Communication: Mobility status  Activity Tolerance: Patient tolerated treatment well Patient left: in bed;with call bell/phone within reach;with bed alarm set  OT Visit Diagnosis: Other abnormalities of gait and mobility (R26.89)                Time: 8790-8767 OT Time Calculation (min): 23 min Charges:  OT General Charges $OT Visit: 1 Visit OT Evaluation $OT Eval Moderate Complexity: 1 Mod  Banessa Mao OTR/L, MS Acute Rehabilitation Department Office# [phone]   Geofm CHRISTELLA Dance 10/02/2023, 4:18 PM

## 2023-10-03 DIAGNOSIS — G9341 Metabolic encephalopathy: Secondary | ICD-10-CM | POA: Diagnosis not present

## 2023-10-03 LAB — BASIC METABOLIC PANEL
Anion gap: 9 (ref 5–15)
BUN: 29 mg/dL — ABNORMAL HIGH (ref 8–23)
CO2: 20 mmol/L — ABNORMAL LOW (ref 22–32)
Calcium: 8.1 mg/dL — ABNORMAL LOW (ref 8.9–10.3)
Chloride: 110 mmol/L (ref 98–111)
Creatinine, Ser: 2.03 mg/dL — ABNORMAL HIGH (ref 0.61–1.24)
GFR, Estimated: 31 mL/min — ABNORMAL LOW (ref >60)
Glucose, Bld: 135 mg/dL — ABNORMAL HIGH (ref 70–99)
Potassium: 4.1 mmol/L (ref 3.5–5.1)
Sodium: 139 mmol/L (ref 135–145)

## 2023-10-03 LAB — CBC
HCT: 42.9 % (ref 39.0–52.0)
Hemoglobin: 13.7 g/dL (ref 13.0–17.0)
MCH: 29.2 pg (ref 26.0–34.0)
MCHC: 31.9 g/dL (ref 30.0–36.0)
MCV: 91.5 fL (ref 80.0–100.0)
Platelets: 222 10*3/uL (ref 150–400)
RBC: 4.69 MIL/uL (ref 4.22–5.81)
RDW: 13.6 % (ref 11.5–15.5)
WBC: 9 10*3/uL (ref 4.0–10.5)
nRBC: 0 % (ref 0.0–0.2)

## 2023-10-03 LAB — GLUCOSE, CAPILLARY
Glucose-Capillary: 115 mg/dL — ABNORMAL HIGH (ref 70–99)
Glucose-Capillary: 101 mg/dL — ABNORMAL HIGH (ref 70–99)
Glucose-Capillary: 155 mg/dL — ABNORMAL HIGH (ref 70–99)
Glucose-Capillary: 141 mg/dL — ABNORMAL HIGH (ref 70–99)

## 2023-10-03 MED ORDER — ONDANSETRON 4 MG PO TBDP
4.0000 mg | ORAL_TABLET | Freq: Once | ORAL | Status: AC
  Administered 2023-10-03: 4 mg via ORAL
  Filled 2023-10-03: qty 1

## 2023-10-03 NOTE — Progress Notes (Signed)
Pt placed on CPAP for night rest tolerating well

## 2023-10-03 NOTE — Progress Notes (Addendum)
 SLP did not have time to follow up with pt today - however messaged RN, Ivin Marrow, who reports pt had choking episode with consumption of eggs/sausage today - Advised she ask pt to order extra gravies and sauces to assure food is adequately moistened.  Will follow up next date for dysphagia management to mitigate chronic dysphagia/aspiration risk and to initiate exercises.    Maudie Sorrow, MS [facility] SLP Acute [employer] [phone]

## 2023-10-03 NOTE — Plan of Care (Signed)
  Problem: Education: Goal: Knowledge of General Education information will improve Description: Including pain rating scale, medication(s)/side effects and non-pharmacologic comfort measures Outcome: Progressing   Problem: Health Behavior/Discharge Planning: Goal: Ability to manage health-related needs will improve Outcome: Progressing   Problem: Clinical Measurements: Goal: Ability to maintain clinical measurements within normal limits will improve Outcome: Progressing Goal: Will remain free from infection Outcome: Progressing Goal: Diagnostic test results will improve Outcome: Progressing Goal: Respiratory complications will improve Outcome: Progressing Goal: Cardiovascular complication will be avoided Outcome: Progressing   Problem: Activity: Goal: Risk for activity intolerance will decrease Outcome: Progressing   Problem: Nutrition: Goal: Adequate nutrition will be maintained Outcome: Progressing   Problem: Coping: Goal: Level of anxiety will decrease Outcome: Progressing   Problem: Elimination: Goal: Will not experience complications related to bowel motility Outcome: Progressing Goal: Will not experience complications related to urinary retention Outcome: Progressing   Problem: Pain Management: Goal: General experience of comfort will improve Outcome: Progressing   Problem: Safety: Goal: Ability to remain free from injury will improve Outcome: Progressing   Problem: Skin Integrity: Goal: Risk for impaired skin integrity will decrease Outcome: Progressing   Problem: Education: Goal: Knowledge of risk factors and measures for prevention of condition will improve Outcome: Progressing   Problem: Coping: Goal: Psychosocial and spiritual needs will be supported Outcome: Progressing   Problem: Respiratory: Goal: Will maintain a patent airway Outcome: Progressing Goal: Complications related to the disease process, condition or treatment will be avoided or  minimized Outcome: Progressing   Problem: Education: Goal: Ability to describe self-care measures that may prevent or decrease complications (Diabetes Survival Skills Education) will improve Outcome: Progressing Goal: Individualized Educational Video(s) Outcome: Progressing   Problem: Coping: Goal: Ability to adjust to condition or change in health will improve Outcome: Progressing   Problem: Fluid Volume: Goal: Ability to maintain a balanced intake and output will improve Outcome: Progressing   Problem: Health Behavior/Discharge Planning: Goal: Ability to identify and utilize available resources and services will improve Outcome: Progressing Goal: Ability to manage health-related needs will improve Outcome: Progressing   Problem: Metabolic: Goal: Ability to maintain appropriate glucose levels will improve Outcome: Progressing   Problem: Nutritional: Goal: Maintenance of adequate nutrition will improve Outcome: Progressing Goal: Progress toward achieving an optimal weight will improve Outcome: Progressing   Problem: Skin Integrity: Goal: Risk for impaired skin integrity will decrease Outcome: Progressing   Problem: Tissue Perfusion: Goal: Adequacy of tissue perfusion will improve Outcome: Progressing

## 2023-10-03 NOTE — Progress Notes (Signed)
 Progress Note   Patient: James DELGROSSO, MD YQM:[id number] DOB: 1930-11-01 DOA: 09/30/2023     3 DOS: the patient was seen and examined on 10/03/2023   Brief hospital course: 89yo with h/o HLD, DM, chronic diastolic CHF, CVA, hypothyroidism, dementia, BPH, stage 3b CKD, OSA on CPAP, and recent admission for VRE UTI who presented on 1/12 with AMS.  He was recently admitted from 12/26-1/7 for COVID and UTI with encephalopathy.  He is currently again being treated for UTI with Zosyn  based on recent culture growing Morganella and Proteus.  Current urine and blood cultures are pending.  Assessment and Plan:  Acute metabolic encephalopathy Patient presenting with encephalopathy as evidenced by generalized weakness and some confusion His daughter reports that he does not have underlying dementia, although based on evaluations during prior hospitalization and current hospitalization I have concerns about mild dementia with waxing and waning mental status as well as FTT (nursing staff agrees that patient is "forgetful") Evaluation thus far unremarkable other than apparent dehydration with AKI on presentation While initial concern was for recurrent (MDR) UTI, urine culture grew <10k colonies and this seems less likely as the cause; stopped antibiotics Continue with IVF hydration for an additional 24 hours PT/OT/SLP consults Appears likely to benefit from SNF rehab   AKI on Stage 3b chronic kidney disease Recent baseline creatinine 1.4-1.9 Creatinine 2.4 on presentation, improved to 2.19 -> 2.15 -> 2.03 Likely mild dehydration with AKI on CKD Continue an additional 24 hours of IV hydration Recheck BMP in AM Avoid nephrotoxic medications  Dysphagia Evaluated by SLP on 1/15 He has chronic dysphagia and has been placed on a modified diet (dysphagia 2, nectar thick) Despite this, he reports an aspiration event this AM SLP notified in case there are other measures that can be implemented No  current evidence of aspiration PNA (no fever, no residual cough, no hypoxia)    H/o UTI (urinary tract infection) Patient recently had VRE-UTI He has been treated with cefuroxime  for Proteus mirabilis UTI recently Initial urinalysis done on 12/26 positive for leukocytes but negative for bacteria  Urine culture from 12/26 with Morganella Morgagni and Proteus Current urine culture with <10 k colonies, does not appear to need ongoing treatment at this time Blood cultures are pending and NTD  History of CVA (cerebrovascular accident) Continue Crestor  and Plavix  Imaging survey negative for acute CVA   Hyperlipidemia associated with type 2 diabetes mellitus (HCC) Continue Crestor    HTN (hypertension) IV hydralazine  as needed   Type II diabetes mellitus with renal manifestations Recent A1c 6.7, well-controlled - his A1c goal is <8 Patient previously taking NovoLog , semaglutide , Tresiba  80 units daily Hold semglee  for now Sensitive sliding scale Avoid hypoglycemia Continue gabapentin    Chronic diastolic CHF (congestive heart failure)  2D echo on 01/02/2022 showed EF of 55-60% CHF seem to be compensated   Hypothyroidism Continue Synthroid    Restless leg syndrome, severe Continue Pramipexole    BPH with obstruction/lower urinary tract symptoms Continue tamsulosin    OSA Continue CPAP nightly   Glaucoma Continue Combigan    Mood d/o Continue duloxetine    DNR Patient confirmed to be DNR He will need an out of facility DNR form at the time of discharge         Consultants: SLP PT OT TOC team   Procedures: None   Antibiotics: Meropenem  1/12-13 Zosyn  1/13-14  30 Day Unplanned Readmission Risk Score    Flowsheet Row Admission (Current) from 09/30/2023 in [facility]  30 Day Unplanned Readmission Risk Score (%) 30.21 Filed at 10/03/2023 0801       This score is the patient's risk of an unplanned readmission within 30  days of being discharged (0 -100%). The score is based on dignosis, age, lab data, medications, orders, and past utilization.   Low:  0-14.9   Medium: 15-21.9   High: 22-29.9   Extreme: 30 and above           Subjective: Reports that he choked significantly this AM and it took him a while to rebound.  He is ok now, somewhat somnolent.   Objective: Vitals:   10/02/23 2003 10/03/23 0507  BP: (!) 120/56 (!) 119/57  Pulse: 80 74  Resp: 18   Temp: 98.1 F (36.7 C) 98.4 F (36.9 C)  SpO2: 98% 99%    Intake/Output Summary (Last 24 hours) at 10/03/2023 0851 Last data filed at 10/03/2023 0600 Gross per 24 hour  Intake 1444.57 ml  Output 1000 ml  Net 444.57 ml   Filed Weights   09/30/23 1713 09/30/23 1720  Weight: 77.3 kg 77.3 kg    Exam:  General:  Appears calm and comfortable and is in NAD, somewhat fatigued Eyes:  EOMI, normal lids, iris ENT:  grossly normal hearing, lips & tongue, mmm Neck:  no LAD, masses or thyromegaly Cardiovascular:  RRR, no m/r/g. No LE edema.  Respiratory:   CTA bilaterally with no wheezes/rales/rhonchi.  Normal respiratory effort. Abdomen:  soft, NT, ND Skin:  no rash or induration seen on limited exam Musculoskeletal:  grossly normal tone BUE/BLE, good ROM, no bony abnormality Psychiatric:  blunted mood and affect, speech fluent and appropriate, AOx3 Neurologic:  CN 2-12 grossly intact, moves all extremities in coordinated fashion  Data Reviewed: I have reviewed the patient's lab results since admission.  Pertinent labs for today include:   Glucose 135 BUN 29/Creatinine 2.03/GFR 31 - improved Normal CBC     Family Communication: None present  Disposition: Status is: Inpatient Remains inpatient appropriate because: ongoing evaluation and treatment     Time spent: 50 minutes  Unresulted Labs (From admission, onward)     Start     Ordered   10/02/23 0500  Basic metabolic panel  Daily,   R     Question:  Specimen collection  method  Answer:  Lab=Lab collect   10/01/23 1254   10/02/23 0500  CBC  Daily,   R     Question:  Specimen collection method  Answer:  Lab=Lab collect   10/01/23 1254             Author: Lorita Rosa, MD 10/03/2023 8:51 AM  For on call review [email or web address].

## 2023-10-04 DIAGNOSIS — G9341 Metabolic encephalopathy: Secondary | ICD-10-CM | POA: Diagnosis not present

## 2023-10-04 LAB — BASIC METABOLIC PANEL
Anion gap: 8 (ref 5–15)
BUN: 26 mg/dL — ABNORMAL HIGH (ref 8–23)
CO2: 22 mmol/L (ref 22–32)
Calcium: 8.2 mg/dL — ABNORMAL LOW (ref 8.9–10.3)
Chloride: 107 mmol/L (ref 98–111)
Creatinine, Ser: 1.91 mg/dL — ABNORMAL HIGH (ref 0.61–1.24)
GFR, Estimated: 33 mL/min — ABNORMAL LOW (ref >60)
Glucose, Bld: 116 mg/dL — ABNORMAL HIGH (ref 70–99)
Potassium: 3.9 mmol/L (ref 3.5–5.1)
Sodium: 137 mmol/L (ref 135–145)

## 2023-10-04 LAB — GLUCOSE, CAPILLARY
Glucose-Capillary: 99 mg/dL (ref 70–99)
Glucose-Capillary: 115 mg/dL — ABNORMAL HIGH (ref 70–99)
Glucose-Capillary: 137 mg/dL — ABNORMAL HIGH (ref 70–99)
Glucose-Capillary: 132 mg/dL — ABNORMAL HIGH (ref 70–99)

## 2023-10-04 LAB — CBC
HCT: 41 % (ref 39.0–52.0)
Hemoglobin: 13.3 g/dL (ref 13.0–17.0)
MCH: 29.2 pg (ref 26.0–34.0)
MCHC: 32.4 g/dL (ref 30.0–36.0)
MCV: 90.1 fL (ref 80.0–100.0)
Platelets: 189 10*3/uL (ref 150–400)
RBC: 4.55 MIL/uL (ref 4.22–5.81)
RDW: 13.2 % (ref 11.5–15.5)
WBC: 8.5 10*3/uL (ref 4.0–10.5)
nRBC: 0 % (ref 0.0–0.2)

## 2023-10-04 MED ORDER — DOCUSATE SODIUM 100 MG PO CAPS
100.0000 mg | ORAL_CAPSULE | Freq: Two times a day (BID) | ORAL | Status: DC
  Administered 2023-10-05 – 2023-10-06 (×3): 100 mg via ORAL
  Filled 2023-10-04 (×5): qty 1

## 2023-10-04 MED ORDER — POLYETHYLENE GLYCOL 3350 17 G PO PACK
17.0000 g | PACK | Freq: Every day | ORAL | Status: DC | PRN
  Filled 2023-10-04: qty 1

## 2023-10-04 MED ORDER — BISACODYL 5 MG PO TBEC: 5.0000 mg | DELAYED_RELEASE_TABLET | Freq: Every day | ORAL | Status: DC | PRN

## 2023-10-04 NOTE — [place] FL2 (Signed)
[place] MEDICAID FL2 LEVEL OF CARE FORM     IDENTIFICATION  Patient Name: [name], MD Birthdate: [birth year] Sex: male Admission Date (Current Location): 09/30/2023  [facility] and [place] Number:  Producer, television/film/video and Address:  [facility],  [address] [place], [place] [address]      Provider Number: [id number]  Attending Physician Name and Address:  Jonah Blue, MD  Relative Name and Phone Number:  Tyrone Schimke (dtr) [phone]    Current Level of Care: Hospital Recommended Level of Care: Skilled Nursing Facility Prior Approval Number:    Date Approved/Denied:   PASRR Number: [id number]  Discharge Plan: SNF    Current Diagnoses: Patient Active Problem List   Diagnosis Date Noted   Acute kidney injury (HCC) 09/30/2023   Glaucoma 09/30/2023   COVID-19 09/16/2023   Acute metabolic encephalopathy 09/14/2023   Speaking difficulty 09/13/2023   Type II diabetes mellitus with renal manifestations (HCC) 09/13/2023   Chronic diastolic CHF (congestive heart failure) (HCC) 09/13/2023   Obesity (BMI 30-39.9) 09/13/2023   Chronic kidney disease, stage 3b (HCC) 09/13/2023   Hypocalcemia 08/15/2023   Urinary frequency 08/15/2023   VRE (vancomycin-resistant Enterococci) infection 07/02/2023   Pedal edema 06/22/2023   Abnormal chest CT 05/25/2023   Diplopia 05/25/2023   History of partial ray amputation of second toe of left foot (HCC) 03/07/2023   Closed right ankle fracture 03/07/2023   White patches on oral mucosa 03/07/2023   Memory deficit 07/31/2022   Diabetic ulcer of toe associated with type 2 diabetes mellitus, limited to breakdown of skin (HCC) 04/24/2022   Bradycardia    Insomnia 01/01/2022   Hypoglycemia 01/01/2022   Aspiration pneumonia (HCC) 11/23/2021   Generalized weakness 11/23/2021   Hypomagnesemia 11/23/2021   History of Clostridium difficile colitis    Postural dizziness with presyncope 10/25/2021   General weakness  05/12/2021   Polypharmacy 03/10/2021   Acute encephalopathy 11/02/2020   UTI (urinary tract infection) 10/18/2020   Nausea vomiting and diarrhea 10/18/2020   Involuntary movements 04/25/2020   Recurrent productive cough 05/31/2019   Hypothyroidism 03/11/2019   Hx of basal cell carcinoma 07/10/2017   Rosacea    Gait disturbance, post-stroke 10/05/2015   Presbycusis of both ears    Impaired mobility 04/23/2015   Hearing loss of right ear due to cerumen impaction 03/23/2015   HTN (hypertension) 03/18/2015   Diastolic dysfunction 03/18/2015   Spastic hemiparesis affecting dominant side (HCC) 07/07/2014   Right leg swelling 06/09/2014   Medicare annual wellness visit, subsequent 11/06/2013   Erectile dysfunction due to arterial insufficiency 02/05/2013   MDD (major depressive disorder), recurrent episode, moderate (HCC)    Palpitations 06/26/2012   Hyperlipidemia associated with type 2 diabetes mellitus (HCC)    Restless leg syndrome, severe    GERD (gastroesophageal reflux disease)    BPH with obstruction/lower urinary tract symptoms    History of CVA (cerebrovascular accident) 03/11/2012   Hemiparesis affecting right side as late effect of cerebrovascular accident (CVA) (HCC) 03/11/2012   Type 2 diabetes mellitus with hyperglycemia (HCC) 03/11/2012   OSA on CPAP 03/11/2012   CKD stage 3 due to type 2 diabetes mellitus (HCC) 03/11/2012   Migraines 03/11/2012   Thrombocytopenia (HCC) 2011   Fall with injury 2011    Orientation RESPIRATION BLADDER Height & Weight     Self, Time, Situation, Place  Other (Comment) (cpap at bedtime) External catheter Weight: 77.3 kg Height:  5\' 8"  (172.7 cm)  BEHAVIORAL SYMPTOMS/MOOD NEUROLOGICAL BOWEL NUTRITION  STATUS      Incontinent Diet (Dyshagia 3, nectar thick liquids)  AMBULATORY STATUS COMMUNICATION OF NEEDS Skin   Limited Assist Verbally Other (Comment) (erythema and redness bilateral buttocks; diabetic ulcer left great toe; diabetic ulcer  right 5th toe; moisture associated skin damage right and anterior pelvis)                       Personal Care Assistance Level of Assistance  Bathing, Feeding, Dressing Bathing Assistance: Limited assistance Feeding assistance: Limited assistance Dressing Assistance: Limited assistance     Functional Limitations Info  Sight, Hearing, Speech Sight Info: Impaired (reading glasses) Hearing Info: Impaired (bilateral hearing aids) Speech Info: Adequate    SPECIAL CARE FACTORS FREQUENCY  PT (By licensed PT), OT (By licensed OT)     PT Frequency: 5x/week OT Frequency: 5x/week            Contractures Contractures Info: Not present    Additional Factors Info  Code Status, Allergies, Insulin Sliding Scale Code Status Info: DNR Allergies Info: Dulaglutide, Empagliflozin, Lisinopril, Nexium (Esomeprazole), Oxycodone, Pregabalin, Rosuvastatin   Insulin Sliding Scale Info: See MAR Isolation Precautions Info: Contact hx VRE     Current Medications (10/04/2023):  This is the current hospital active medication list Current Facility-Administered Medications  Medication Dose Route Frequency Provider Last Rate Last Admin   acetaminophen (TYLENOL) tablet 650 mg  650 mg Oral Q6H PRN Bobette Mo, MD   650 mg at 10/01/23 2216   Or   acetaminophen (TYLENOL) suppository 650 mg  650 mg Rectal Q6H PRN Bobette Mo, MD       brimonidine (ALPHAGAN) 0.2 % ophthalmic solution 1 drop  1 drop Both Eyes BID Bobette Mo, MD   1 drop at 10/04/23 3474   clopidogrel (PLAVIX) tablet 75 mg  75 mg Oral Daily Bobette Mo, MD   75 mg at 10/04/23 0949   DULoxetine (CYMBALTA) DR capsule 60 mg  60 mg Oral Daily Bobette Mo, MD   60 mg at 10/04/23 0949   famotidine (PEPCID) tablet 20 mg  20 mg Oral QHS Bobette Mo, MD   20 mg at 10/03/23 2149   feeding supplement (ENSURE ENLIVE / ENSURE PLUS) liquid 237 mL  237 mL Oral BID BM Bobette Mo, MD   237 mL at  10/04/23 0956   gabapentin (NEURONTIN) capsule 300 mg  300 mg Oral BID Jonah Blue, MD   300 mg at 10/04/23 0948   guaiFENesin-dextromethorphan (ROBITUSSIN DM) 100-10 MG/5ML syrup 5 mL  5 mL Oral Q4H PRN Bobette Mo, MD   5 mL at 10/01/23 2217   heparin injection 5,000 Units  5,000 Units Subcutaneous Q8H Bobette Mo, MD   5,000 Units at 10/03/23 2149   insulin aspart (novoLOG) injection 0-9 Units  0-9 Units Subcutaneous TID WC Bobette Mo, MD   2 Units at 10/03/23 1727   levothyroxine (SYNTHROID) tablet 50 mcg  50 mcg Oral Q0600 Bobette Mo, MD   50 mcg at 10/04/23 0550   ondansetron [facility]) tablet 4 mg  4 mg Oral Q6H PRN Bobette Mo, MD       Or   ondansetron [facility]) injection 4 mg  4 mg Intravenous Q6H PRN Bobette Mo, MD   4 mg at 10/03/23 1031   pantoprazole (PROTONIX) EC tablet 40 mg  40 mg Oral Daily Bobette Mo, MD   40 mg at 10/04/23 [phone]  pramipexole (MIRAPEX) tablet 1 mg  1 mg Oral QAC supper Bobette Mo, MD   1 mg at 10/03/23 1727   rosuvastatin (CRESTOR) tablet 10 mg  10 mg Oral Daily Jonah Blue, MD   10 mg at 10/04/23 0949   tamsulosin (FLOMAX) capsule 0.4 mg  0.4 mg Oral QPC supper Bobette Mo, MD   0.4 mg at 10/03/23 1727   timolol (TIMOPTIC) 0.5 % ophthalmic solution 1 drop  1 drop Both Eyes BID Bobette Mo, MD   1 drop at 10/04/23 5409     Discharge Medications: Please see discharge summary for a list of discharge medications.  Relevant Imaging Results:  Relevant Lab Results:   Additional Information SSN [id number]  Adrian Prows, RN

## 2023-10-04 NOTE — TOC Initial Note (Signed)
Transition of Care [facility]) - Initial/Assessment Note    Patient Details  Name: James RIVENBARK, MD MRN: [id number] Date of Birth: [birth year]  Transition of Care [facility]) CM/SW Contact:    Adrian Prows, RN Phone Number: 10/04/2023, 10:59 AM  Clinical Narrative:                 TOC for d/c planning; pt from [facility] [place], [place]; contacted facility ([phone]) and spoke w/ Kathrin Greathouse, General Manager; she says facility is [place] but the pt has HHPT/OT/ST; she also says if pt needs higher level of care  he will not be able to return to facility; nursing staff currently working w/ pt; spoke w/ his dtr Tyrone Schimke [phone]); she says pt would like SNF; explained SNF process and ins auth'; pt's dtr verbalized understanding; she requested bed search be limited to [facility] area; she says the pt has reading glasses, and bilateral HAs; he also has private pay and [employer] for HHPT/OT/ST; electric wheel chair, walker, shower chair, and BSC; she says pt does not have home oxygen.   Expected Discharge Plan: Skilled Nursing Facility Barriers to Discharge: Continued Medical Work up   Patient Goals and CMS Choice Patient states their goals for this hospitalization and ongoing recovery are:: pt will d/c to snf per his dtr Tyrone Schimke CMS Medicare.gov Compare Post Acute Care list provided to:: Patient Represenative (must comment) Tyrone Schimke (dtr))        Expected Discharge Plan and Services   Discharge Planning Services: CM Consult   Living arrangements for the past 2 months: Independent Living Facility [facility] [place], [place], [place])                                      Prior Living Arrangements/Services Living arrangements for the past 2 months: Independent Living Facility ([facility], [place], [place]) Lives with:: Facility Resident Patient language and need for interpreter reviewed:: Yes Do you feel safe going back to the place where you  live?: Yes      Need for Family Participation in Patient Care: Yes (Comment) Care giver support system in place?: Yes (comment) Current home services: DME, Home OT, Home PT, Other (comment) (HHPT/OT/ST; electric wheel chair, walker, shower chair, and BSC) Criminal Activity/Legal Involvement Pertinent to Current Situation/Hospitalization: No - Comment as needed  Activities of Daily Living   ADL Screening (condition at time of admission) Independently performs ADLs?: No Does the patient have a NEW difficulty with bathing/dressing/toileting/self-feeding that is expected to last >3 days?: Yes (Initiates electronic notice to provider for possible OT consult) Does the patient have a NEW difficulty with getting in/out of bed, walking, or climbing stairs that is expected to last >3 days?: Yes (Initiates electronic notice to provider for possible PT consult) Does the patient have a NEW difficulty with communication that is expected to last >3 days?: No Is the patient deaf or have difficulty hearing?: Yes Does the patient have difficulty seeing, even when wearing glasses/contacts?: No Does the patient have difficulty concentrating, remembering, or making decisions?: No  Permission Sought/Granted Permission sought to share information with : Case Manager Permission granted to share information with : Yes, Verbal Permission Granted  Share Information with NAME: Case Manager     Permission granted to share info w Relationship: Tyrone Schimke (dtr) [phone]     Emotional Assessment Appearance:: Other (Comment Required (unable to assess) Attitude/Demeanor/Rapport: Unable to  Assess Affect (typically observed): Unable to Assess Orientation: :  (unable to assess) Alcohol / Substance Use: Not Applicable Psych Involvement: No (comment)  Admission diagnosis:  Acute kidney injury [facility]) [N17.9] Patient Active Problem List   Diagnosis Date Noted   Acute kidney injury (HCC) 09/30/2023   Glaucoma  09/30/2023   COVID-19 09/16/2023   Acute metabolic encephalopathy 09/14/2023   Speaking difficulty 09/13/2023   Type II diabetes mellitus with renal manifestations (HCC) 09/13/2023   Chronic diastolic CHF (congestive heart failure) (HCC) 09/13/2023   Obesity (BMI 30-39.9) 09/13/2023   Chronic kidney disease, stage 3b (HCC) 09/13/2023   Hypocalcemia 08/15/2023   Urinary frequency 08/15/2023   VRE (vancomycin-resistant Enterococci) infection 07/02/2023   Pedal edema 06/22/2023   Abnormal chest CT 05/25/2023   Diplopia 05/25/2023   History of partial ray amputation of second toe of left foot (HCC) 03/07/2023   Closed right ankle fracture 03/07/2023   White patches on oral mucosa 03/07/2023   Memory deficit 07/31/2022   Diabetic ulcer of toe associated with type 2 diabetes mellitus, limited to breakdown of skin (HCC) 04/24/2022   Bradycardia    Insomnia 01/01/2022   Hypoglycemia 01/01/2022   Aspiration pneumonia (HCC) 11/23/2021   Generalized weakness 11/23/2021   Hypomagnesemia 11/23/2021   History of Clostridium difficile colitis    Postural dizziness with presyncope 10/25/2021   General weakness 05/12/2021   Polypharmacy 03/10/2021   Acute encephalopathy 11/02/2020   UTI (urinary tract infection) 10/18/2020   Nausea vomiting and diarrhea 10/18/2020   Involuntary movements 04/25/2020   Recurrent productive cough 05/31/2019   Hypothyroidism 03/11/2019   Hx of basal cell carcinoma 07/10/2017   Rosacea    Gait disturbance, post-stroke 10/05/2015   Presbycusis of both ears    Impaired mobility 04/23/2015   Hearing loss of right ear due to cerumen impaction 03/23/2015   HTN (hypertension) 03/18/2015   Diastolic dysfunction 03/18/2015   Spastic hemiparesis affecting dominant side (HCC) 07/07/2014   Right leg swelling 06/09/2014   Medicare annual wellness visit, subsequent 11/06/2013   Erectile dysfunction due to arterial insufficiency 02/05/2013   MDD (major depressive  disorder), recurrent episode, moderate (HCC)    Palpitations 06/26/2012   Hyperlipidemia associated with type 2 diabetes mellitus (HCC)    Restless leg syndrome, severe    GERD (gastroesophageal reflux disease)    BPH with obstruction/lower urinary tract symptoms    History of CVA (cerebrovascular accident) 03/11/2012   Hemiparesis affecting right side as late effect of cerebrovascular accident (CVA) (HCC) 03/11/2012   Type 2 diabetes mellitus with hyperglycemia (HCC) 03/11/2012   OSA on CPAP 03/11/2012   CKD stage 3 due to type 2 diabetes mellitus (HCC) 03/11/2012   Migraines 03/11/2012   Thrombocytopenia (HCC) 2011   Fall with injury 2011   PCP:  Eustaquio Boyden, MD Pharmacy:   [facility] DELIVERY - [place], [place] - [address] [place] [address] Phone: [phone] Fax: [phone]  [id number] - [place], [place] - [address] AT [facility] [address] [place] [address] Phone: [phone] Fax: [phone]     Social Drivers of Health (SDOH) Social History: SDOH Screenings   Food Insecurity: No Food Insecurity (10/04/2023)  Housing: Low Risk  (10/04/2023)  Transportation Needs: No Transportation Needs (10/04/2023)  Utilities: Not At Risk (10/04/2023)  Depression (PHQ2-9): Low Risk  (05/10/2023)  Financial Resource Strain: Low Risk  (12/30/2020)  Social Connections:  Socially Isolated (09/30/2023)  Tobacco Use: Low Risk  (09/30/2023)   SDOH Interventions: Food Insecurity Interventions: Intervention Not Indicated, Inpatient TOC Transportation Interventions: Intervention Not Indicated, Inpatient TOC Utilities Interventions: Intervention Not Indicated, Inpatient TOC   Readmission Risk Interventions    10/04/2023   10:55 AM  Readmission Risk Prevention Plan  Transportation Screening Complete  Medication Review (RN Care Manager) Complete  PCP or Specialist appointment  within 3-5 days of discharge Complete  HRI or Home Care Consult Complete  SW Recovery Care/Counseling Consult Complete  Palliative Care Screening Not Applicable  Skilled Nursing Facility Complete

## 2023-10-04 NOTE — TOC Progression Note (Signed)
Transition of Care [facility]) - Progression Note    Patient Details  Name: James LOUGHRAN, MD MRN: [id number] Date of Birth: [birth year]  Transition of Care [facility]) CM/SW Contact  Adrian Prows, RN Phone Number: 10/04/2023, 12:54 PM  Clinical Narrative:    Faxed out to SNFs: [facility], [address] [facility]	[place], [facility] [place], [facility] [place], [facility], and [facility] [place]; awaiting bed offers ins auth needed.    Expected Discharge Plan: Skilled Nursing Facility Barriers to Discharge: Continued Medical Work up  Expected Discharge Plan and Services   Discharge Planning Services: CM Consult   Living arrangements for the past 2 months: Independent Living Facility ([facility], [place], [place])                                       Social Determinants of Health (SDOH) Interventions SDOH Screenings   Food Insecurity: No Food Insecurity (10/04/2023)  Housing: Low Risk  (10/04/2023)  Transportation Needs: No Transportation Needs (10/04/2023)  Utilities: Not At Risk (10/04/2023)  Depression (PHQ2-9): Low Risk  (05/10/2023)  Financial Resource Strain: Low Risk  (12/30/2020)  Social Connections: Socially Isolated (09/30/2023)  Tobacco Use: Low Risk  (09/30/2023)    Readmission Risk Interventions    10/04/2023   10:55 AM  Readmission Risk Prevention Plan  Transportation Screening Complete  Medication Review (RN Care Manager) Complete  PCP or Specialist appointment within 3-5 days of discharge Complete  HRI or Home Care Consult Complete  SW Recovery Care/Counseling Consult Complete  Palliative Care Screening Not Applicable  Skilled Nursing Facility Complete

## 2023-10-04 NOTE — Progress Notes (Signed)
Progress Note   Patient: James MANIERI, MD ZOX:[id number] DOB: 1931-04-23 DOA: 09/30/2023     4 DOS: the patient was seen and examined on 10/04/2023   Brief hospital course: 89yo with h/o HLD, DM, chronic diastolic CHF, CVA, hypothyroidism, dementia, BPH, stage 3b CKD, OSA on CPAP, and recent admission for VRE UTI who presented on 1/12 with AMS.  He was recently admitted from 12/26-1/7 for COVID and UTI with encephalopathy.  He is currently again being treated for UTI with Zosyn based on recent culture growing Morganella and Proteus.  Current urine and blood cultures are pending.  Assessment and Plan:  Acute metabolic encephalopathy Patient presenting with encephalopathy as evidenced by generalized weakness and some confusion His daughter reports that he does not have underlying dementia, although based on evaluations during prior hospitalization and current hospitalization I have concerns about mild dementia with waxing and waning mental status as well as FTT (nursing staff agrees that patient is "forgetful") Evaluation thus far unremarkable other than apparent dehydration with AKI on presentation While initial concern was for recurrent (MDR) UTI, urine culture grew <10k colonies and this seems less likely as the cause; stopped antibiotics PT/OT/SLP consults Appears likely to benefit from SNF rehab - although patient states that placement would be over his objection and he wants to go back to his ILF   AKI on Stage 3b chronic kidney disease Recent baseline creatinine 1.4-1.9 Creatinine 2.4 on presentation, improved to 2.19 -> 2.15 -> 2.03 -> 1.91 Likely mild dehydration with AKI on CKD, slowly improving Recheck BMP in AM Avoid nephrotoxic medications   Dysphagia Evaluated by SLP on 1/15 He has chronic dysphagia and has been placed on a modified diet (dysphagia 2, nectar thick) Despite this, he reported an aspiration event yesterday AM SLP reconsulted, changed to dysphagia 3  diet No current evidence of aspiration PNA (no fever, no residual cough, no hypoxia)  Constipation Bowel regimen ordered    H/o UTI (urinary tract infection) Patient recently had VRE-UTI He has been treated with cefuroxime for Proteus mirabilis UTI recently Initial urinalysis done on 12/26 positive for leukocytes but negative for bacteria  Urine culture from 12/26 with Morganella Morgagni and Proteus Current urine culture with <10 k colonies, does not appear to need ongoing treatment at this time Blood cultures are pending and NTD x 3 days   History of CVA (cerebrovascular accident) Continue Crestor and Plavix Imaging survey negative for acute CVA   Hyperlipidemia associated with type 2 diabetes mellitus (HCC) Continue Crestor   HTN (hypertension) IV hydralazine as needed   Type II diabetes mellitus with renal manifestations Recent A1c 6.7, well-controlled - his A1c goal is <8 Patient previously taking NovoLog, semaglutide, Tresiba 80 units daily Hold semglee for now Sensitive sliding scale Avoid hypoglycemia Continue gabapentin   Chronic diastolic CHF (congestive heart failure)  2D echo on 01/02/2022 showed EF of 55-60% CHF seem to be compensated   Hypothyroidism Continue Synthroid   Restless leg syndrome, severe Continue Pramipexole   BPH with obstruction/lower urinary tract symptoms Continue tamsulosin   OSA Continue CPAP nightly   Glaucoma Continue Combigan   Mood d/o Continue duloxetine   DNR Patient confirmed to be DNR He will need an out of facility DNR form at the time of discharge         Consultants: SLP PT OT TOC team   Procedures: None   Antibiotics: Meropenem 1/12-13 Zosyn 1/13-14   30 Day Unplanned Readmission Risk Score    Flowsheet Row  Admission (Current) from 09/30/2023 in [facility]  30 Day Unplanned Readmission Risk Score (%) 30.31 Filed at 10/04/2023 0801       This score is  the patient's risk of an unplanned readmission within 30 days of being discharged (0 -100%). The score is based on dignosis, age, lab data, medications, orders, and past utilization.   Low:  0-14.9   Medium: 15-21.9   High: 22-29.9   Extreme: 30 and above           Subjective: Still quite weak.  Opposed to SNF rehab when I spoke with him - although his daughter says he is willing.   Objective: Vitals:   10/03/23 2244 10/04/23 0436  BP:  (!) 123/55  Pulse: 84 76  Resp: 18 18  Temp:  97.6 F (36.4 C)  SpO2: 97% 98%    Intake/Output Summary (Last 24 hours) at 10/04/2023 0833 Last data filed at 10/03/2023 1700 Gross per 24 hour  Intake 120 ml  Output 675 ml  Net -555 ml   Filed Weights   09/30/23 1713 09/30/23 1720  Weight: 77.3 kg 77.3 kg    Exam:  General:  Appears calm and comfortable and is in NAD, somewhat fatigued Eyes:  EOMI, normal lids, iris ENT:  grossly normal hearing, lips & tongue, mmm Neck:  no LAD, masses or thyromegaly Cardiovascular:  RRR, no m/r/g. No LE edema.  Respiratory:   CTA bilaterally with no wheezes/rales/rhonchi.  Normal respiratory effort. Abdomen:  soft, NT, ND Skin:  no rash or induration seen on limited exam Musculoskeletal:  grossly normal tone BUE/BLE, good ROM, no bony abnormality Psychiatric:  blunted mood and affect, speech fluent and appropriate, AOx3 Neurologic:  CN 2-12 grossly intact, moves all extremities in coordinated fashion  Data Reviewed: I have reviewed the patient's lab results since admission.  Pertinent labs for today include:   Glucose 116 BUN 26/Creatinine 1.91/GFR 33, slowly improving Normal CBC     Family Communication: None present; I spoke with his daughter by telephone  Disposition: Status is: Inpatient Remains inpatient appropriate because: needs placement     Time spent: 35 minutes  Unresulted Labs (From admission, onward)     Start     Ordered   10/02/23 0500  Basic metabolic panel  Daily,    R     Question:  Specimen collection method  Answer:  Lab=Lab collect   10/01/23 1254   10/02/23 0500  CBC  Daily,   R     Question:  Specimen collection method  Answer:  Lab=Lab collect   10/01/23 1254             Author: Jonah Blue, MD 10/04/2023 8:33 AM  For on call review [email or web address].

## 2023-10-04 NOTE — Progress Notes (Signed)
Speech Language Pathology Treatment: Dysphagia  Patient Details Name: James MARUSKA, MD MRN: [id number] DOB: Sep 13, 1931 Today's Date: 10/04/2023 Time: 2355-7322 SLP Time Calculation (min) (ACUTE ONLY): 22 min  Assessment / Plan / Recommendation Clinical Impression  SLP follow up for dysphagia management given pt report of choking with sausage yesterday.  Pt found eating his breakfast - again including eggs/sausage that appear dry.  He takes large boluses and benefits from cues to take small bites. No extra sauces/gravies with his meal - thus SLP advised he use applesauce to help with cohesiveness of boluses. Suspect sausage particulate may have attempted to infiltrate airway from either oral spillage or pharyngeal retention.  Notably pt observed to take large bites and benefits from cues to take SMALL bites to decrease episodic events.    Chronic throat clearing noted during po as well as pt reporting occurring without po - suspect nasal drainage issue.  However advised if pt is reflexively clearing his throat with po - to clear strongly and re-swallow - in hopes to clear larynx and allow transfer into esophagus.    Modified diet to dys3 as pt reports improved tolerance of soft solids vs particulate foods.  Messaged RD to request extra gravy/sauce be added to every meal - to which they advised they would change in [facility] system.  Posted new swallow precaution sign with pt's view using teach back for utilization of compensation strategy to maximize airway protection.    Unfortunately Dr Jeanie Sewer reports as he still enjoys eating but admits it is "work" - Baseline dysphagia from 2013 left basal ganglia CVA has likely worsened given component of deconditioning.   Will continue to follow and initiate RMT to improve airway clearance ability and pharyngeal contraction.  Thanks!    HPI HPI: Patient is a 89 y.o. male with PMH: GERD, hiatal hernia, anemia, basal cell carcinoma, BPH, CKD, CVA  (resultant dysphagia) , dehydration, depression, HLD, bilateral renal cysts, migraine headaches, OSA on CPAP, palpitations, presbycusis of both ears, restless leg syndrome, rosacea, TIA, type 2 diabetes, diabetic peripheral neuropathy. He was recently admitted at [facility] for 12 days due to Covid-19 and UTI (discharged home five days prior to current admission). He presented to [facility] via EMS due to decreased mentation and generalized weakness.  Pt underwent MBS on 10/01/2023 and follow up for dysphagia management indicated.      SLP Plan  Continue with current plan of care      Recommendations for follow up therapy are one component of a multi-disciplinary discharge planning process, led by the attending physician.  Recommendations may be updated based on patient status, additional functional criteria and insurance authorization.    Recommendations  Diet recommendations: Dysphagia 3 (mechanical soft);Nectar-thick liquid Liquids provided via: Cup;Straw Medication Administration: Whole meds with puree Supervision: Patient able to self feed;Intermittent supervision to cue for compensatory strategies (intermittent supervision to help with compensation strategies as he takes large bites) Compensations: Slow rate;Small sips/bites;Chin tuck Postural Changes and/or Swallow Maneuvers: Chin tuck (strong throat clear and re-swallow if reflexively clearing his throat)                   (check for oral clearance after meals - especially right sulci prior to giving thin water)     Dysphagia, oropharyngeal phase (R13.12)     Continue with current plan of care    Rolena Infante, MS [facility] SLP Acute Rehab Services Office [phone]  Chales Abrahams  10/04/2023, 10:28 AM

## 2023-10-04 NOTE — Progress Notes (Signed)
   10/04/23 2242  BiPAP/CPAP/SIPAP  BiPAP/CPAP/SIPAP Pt Type Adult  BiPAP/CPAP/SIPAP Resmed  Mask Type Nasal mask  Mask Size Medium  FiO2 (%) 21 %  Patient Home Equipment No  Auto Titrate Yes (5-15)

## 2023-10-04 NOTE — Plan of Care (Signed)
Pt complaining of hard stools "feels like sitting on a rod"  MD notified and aware; pt refusing PO stool softeners at this time and is requesting an enema.  MD notified and aware of this; enema ordered per MD

## 2023-10-04 NOTE — Progress Notes (Signed)
Physical Therapy Treatment Patient Details Name: James HUBBY, MD MRN: [id number] DOB: 1930-12-23 Today's Date: 10/04/2023   History of Present Illness 89 yo male who was admitted to the hospital on 09/30/2023 due to progressive weakness and change in mentation. Concerning for recurrent UTI with pt recently admitted to [facility] December 2024 secondary to COVID and UTI. November 2024 du eto C-diff and UTI.  Pt found to have AKI and abn labs. Pt PMH includes but is not limited to: anemia, CKD III, CVA, depression, GERD, hernia, HLD, renal cysts, OSA on CPAP, RLS, TIA, DM II, CAD, chronic pain, and hypothyroidism.    PT Comments  Pt initially lethargic but rousable and eager to attempt mobility.  Pt assisted to EOB and to standing x 2 with RW but unable to safely step with L LE.  Transfer bed to chair deferred to use of Stedy which pt was initially leary of but very pleased with device after safe delivery to recliner.   If plan is discharge home, recommend the following: Assist for transportation;A lot of help with bathing/dressing/bathroom;Help with stairs or ramp for entrance;Two people to help with walking and/or transfers   Can travel by private vehicle     No  Equipment Recommendations  None recommended by PT    Recommendations for Other Services       Precautions / Restrictions Precautions Precautions: Fall Precaution Comments: watch O2 Restrictions Weight Bearing Restrictions Per Provider Order: No     Mobility  Bed Mobility Overal bed mobility: Needs Assistance Bed Mobility: Supine to Sit     Supine to sit: Min assist     General bed mobility comments: with increased time; use of bedrail Patient Response: Cooperative  Transfers Overall transfer level: Needs assistance Equipment used: Rolling walker (2 wheels) Transfers: Sit to/from Stand, Bed to chair/wheelchair/BSC Sit to Stand: Mod assist, +2 safety/equipment           General transfer comment: Stood x 2  with RW and x 2 with Antony Salmon Transfer via Lift Equipment: Stedy  Ambulation/Gait               General Gait Details: unable to ambulate at baseline but pt states working toward ambulating with PT/OT.  Pt stood x 2 with RW but with limited ability to advance L LE and support wt on R LE and UEs with attempts to step.  Deferred to [employer] Tilt Bed Patient Response: Cooperative  Modified Rankin (Stroke Patients Only)       Balance Overall balance assessment: Needs assistance Sitting-balance support: Feet supported Sitting balance-Leahy Scale: Fair Sitting balance - Comments: pt requried min A for inital sitting balance and then able to progress to sup Postural control: Posterior lean Standing balance support: Bilateral upper extremity supported Standing balance-Leahy Scale: Poor Standing balance comment: Standing with RW but difficulty maintaining erect posture                            Cognition Arousal: Alert Behavior During Therapy: WFL for tasks assessed/performed, Flat affect Overall Cognitive Status: Within Functional Limits for tasks assessed                         Following Commands: Follows one step commands with increased time, Follows one step commands consistently  Problem Solving: Slow processing General Comments: patient is soft spoken and cooperative, follows multistep directions occational need for increased time for motor processing and planning        Exercises      General Comments        Pertinent Vitals/Pain Pain Assessment Pain Assessment: No/denies pain    Home Living                          Prior Function            PT Goals (current goals can now be found in the care plan section) Acute Rehab PT Goals Patient Stated Goal: get stronger and go home PT Goal Formulation: With patient Time For Goal Achievement: 10/16/23 Potential to  Achieve Goals: Fair Progress towards PT goals: Progressing toward goals    Frequency    Min 1X/week      PT Plan      Co-evaluation              AM-PAC PT "6 Clicks" Mobility   Outcome Measure  Help needed turning from your back to your side while in a flat bed without using bedrails?: A Little Help needed moving from lying on your back to sitting on the side of a flat bed without using bedrails?: A Little Help needed moving to and from a bed to a chair (including a wheelchair)?: A Little Help needed standing up from a chair using your arms (e.g., wheelchair or bedside chair)?: A Lot Help needed to walk in hospital room?: Total Help needed climbing 3-5 steps with a railing? : Total 6 Click Score: 13    End of Session Equipment Utilized During Treatment: Gait belt Activity Tolerance: Patient tolerated treatment well Patient left: in chair;with call bell/phone within reach;with chair alarm set Nurse Communication: Mobility status;Need for lift equipment PT Visit Diagnosis: Other abnormalities of gait and mobility (R26.89);Muscle weakness (generalized) (M62.81);Unsteadiness on feet (R26.81);Difficulty in walking, not elsewhere classified (R26.2) Hemiplegia - Right/Left: Right     Time: 1330-1405 PT Time Calculation (min) (ACUTE ONLY): 35 min  Charges:    $Therapeutic Activity: 23-37 mins PT General Charges $$ ACUTE PT VISIT: 1 Visit                     Mauro Kaufmann PT Acute Rehabilitation Services Pager [phone] Office [phone]    James Bell 10/04/2023, 2:25 PM

## 2023-10-05 ENCOUNTER — Inpatient Hospital Stay (HOSPITAL_COMMUNITY): Payer: Medicare PPO

## 2023-10-05 ENCOUNTER — Encounter (HOSPITAL_COMMUNITY): Payer: Self-pay | Admitting: Radiology

## 2023-10-05 ENCOUNTER — Telehealth: Payer: Self-pay | Admitting: *Deleted

## 2023-10-05 DIAGNOSIS — G9341 Metabolic encephalopathy: Secondary | ICD-10-CM | POA: Diagnosis not present

## 2023-10-05 LAB — GLUCOSE, CAPILLARY
Glucose-Capillary: 165 mg/dL — ABNORMAL HIGH (ref 70–99)
Glucose-Capillary: 151 mg/dL — ABNORMAL HIGH (ref 70–99)
Glucose-Capillary: 115 mg/dL — ABNORMAL HIGH (ref 70–99)
Glucose-Capillary: 196 mg/dL — ABNORMAL HIGH (ref 70–99)

## 2023-10-05 LAB — COMPREHENSIVE METABOLIC PANEL
ALT: 12 U/L (ref 0–44)
AST: 19 U/L (ref 15–41)
Albumin: 2.8 g/dL — ABNORMAL LOW (ref 3.5–5.0)
Alkaline Phosphatase: 52 U/L (ref 38–126)
Anion gap: 16 — ABNORMAL HIGH (ref 5–15)
BUN: 25 mg/dL — ABNORMAL HIGH (ref 8–23)
CO2: 16 mmol/L — ABNORMAL LOW (ref 22–32)
Calcium: 8.6 mg/dL — ABNORMAL LOW (ref 8.9–10.3)
Chloride: 108 mmol/L (ref 98–111)
Creatinine, Ser: 1.92 mg/dL — ABNORMAL HIGH (ref 0.61–1.24)
GFR, Estimated: 33 mL/min — ABNORMAL LOW (ref >60)
Glucose, Bld: 151 mg/dL — ABNORMAL HIGH (ref 70–99)
Potassium: 3.6 mmol/L (ref 3.5–5.1)
Sodium: 140 mmol/L (ref 135–145)
Total Bilirubin: 1.1 mg/dL (ref 0.0–1.2)
Total Protein: 7.3 g/dL (ref 6.5–8.1)

## 2023-10-05 LAB — CBC
HCT: 45.2 % (ref 39.0–52.0)
Hemoglobin: 14.8 g/dL (ref 13.0–17.0)
MCH: 29 pg (ref 26.0–34.0)
MCHC: 32.7 g/dL (ref 30.0–36.0)
MCV: 88.6 fL (ref 80.0–100.0)
Platelets: 194 10*3/uL (ref 150–400)
RBC: 5.1 MIL/uL (ref 4.22–5.81)
RDW: 13.2 % (ref 11.5–15.5)
WBC: 13.3 10*3/uL — ABNORMAL HIGH (ref 4.0–10.5)
nRBC: 0 % (ref 0.0–0.2)

## 2023-10-05 LAB — LACTIC ACID, PLASMA: Lactic Acid, Venous: 1.2 mmol/L (ref 0.5–1.9)

## 2023-10-05 LAB — PROCALCITONIN: Procalcitonin: 0.4 ng/mL

## 2023-10-05 MED ORDER — SODIUM CHLORIDE (PF) 0.9 % IJ SOLN
INTRAMUSCULAR | Status: AC
Start: 2023-10-05 — End: ?
  Filled 2023-10-05: qty 100

## 2023-10-05 MED ORDER — LACTATED RINGERS IV SOLN: INTRAVENOUS | Status: AC

## 2023-10-05 MED ORDER — IOHEXOL 350 MG/ML SOLN
60.0000 mL | Freq: Once | INTRAVENOUS | Status: AC | PRN
  Administered 2023-10-05: 60 mL via INTRAVENOUS

## 2023-10-05 MED ORDER — SODIUM CHLORIDE 0.9 % IV SOLN
3.0000 g | Freq: Two times a day (BID) | INTRAVENOUS | Status: DC
  Administered 2023-10-05 – 2023-10-06 (×4): 3 g via INTRAVENOUS
  Filled 2023-10-05 (×4): qty 8

## 2023-10-05 MED ORDER — SALINE SPRAY 0.65 % NA SOLN: 1.0000 | NASAL | Status: DC | PRN

## 2023-10-05 MED ORDER — FOOD THICKENER (SIMPLYTHICK): 1.0000 | ORAL | Status: DC | PRN

## 2023-10-05 MED ORDER — FLUTICASONE PROPIONATE 50 MCG/ACT NA SUSP
2.0000 | Freq: Every day | NASAL | Status: DC
Start: 2023-10-05 — End: 2023-10-08
  Administered 2023-10-05 – 2023-10-06 (×2): 2 via NASAL
  Filled 2023-10-05: qty 16

## 2023-10-05 NOTE — Progress Notes (Signed)
Progress Note   Patient: James MCCLEES, MD UXL:[id number] DOB: 1931-04-19 DOA: 09/30/2023     5 DOS: the patient was seen and examined on 10/05/2023   Brief hospital course: 89yo with h/o HLD, DM, chronic diastolic CHF, CVA, hypothyroidism, dementia, BPH, stage 3b CKD, OSA on CPAP, and recent admission for VRE UTI who presented on 1/12 with AMS.  He was recently admitted from 12/26-1/7 for COVID and UTI with encephalopathy.  He is currently again being treated for UTI with Zosyn based on recent culture growing Morganella and Proteus.  Current urine and blood cultures are pending.  Assessment and Plan:    Dysphagia, probable recurrent aspiration PNA Evaluated by SLP on 1/15 He has chronic dysphagia and has been placed on a modified diet (dysphagia 2, nectar thick) Despite this, he reported an aspiration event 1/15 AM SLP reconsulted, changed to dysphagia 3 diet Developed fever, lethargy, leukocytosis in early AM on 1/17, started back on Unasyn for presumed aspiration PNA Blood cultures pending from 1/17  Acute metabolic encephalopathy Patient presenting with encephalopathy as evidenced by generalized weakness and some confusion His daughter reports that he does not have underlying dementia, although based on evaluations during prior hospitalization and current hospitalization I have concerns about mild dementia with waxing and waning mental status as well as FTT (nursing staff agrees that patient is "forgetful") Evaluation thus far unremarkable other than apparent dehydration with AKI on presentation While initial concern was for recurrent (MDR) UTI, urine culture grew <10k colonies and this seems less likely as the cause; stopped antibiotics PT/OT/SLP consults -> SNF rehab Meanwhile, he developed worsening lethargy with dysarthria on 1/17 overnight and so CT and CTA head/neck were ordered and unremarkable; currently suspect that this is related to his aspiration event with developing  PNA   AKI on Stage 3b chronic kidney disease Recent baseline creatinine 1.4-1.9 Creatinine 2.4 on presentation, improved to 2.19 -> 2.15 -> 2.03 -> 1.91 Likely mild dehydration with AKI on CKD, slowly improving Recheck BMP in AM Avoid nephrotoxic medications   Constipation Bowel regimen ordered    H/o UTI (urinary tract infection) Patient recently had VRE-UTI He has been treated with cefuroxime for Proteus mirabilis UTI recently Initial urinalysis done on 12/26 positive for leukocytes but negative for bacteria  Urine culture from 12/26 with Morganella Morgagni and Proteus Current urine culture with <10 k colonies, does not appear to need ongoing treatment at this time   History of CVA (cerebrovascular accident) Continue Crestor and Plavix Imaging survey negative for acute CVA on admission and again on 1/17   Hyperlipidemia associated with type 2 diabetes mellitus (HCC) Continue Crestor   HTN (hypertension) IV hydralazine as needed   Type II diabetes mellitus with renal manifestations Recent A1c 6.7, well-controlled - his A1c goal is <8 Patient previously taking NovoLog, semaglutide, Tresiba 80 units daily Hold semglee for now Sensitive sliding scale Avoid hypoglycemia Continue gabapentin   Chronic diastolic CHF (congestive heart failure)  2D echo on 01/02/2022 showed EF of 55-60% CHF seem to be compensated   Hypothyroidism Continue Synthroid   Restless leg syndrome, severe Continue Pramipexole   BPH with obstruction/lower urinary tract symptoms Continue tamsulosin   OSA Continue CPAP nightly   Glaucoma Continue Combigan   Mood d/o Continue duloxetine   DNR Patient confirmed to be DNR He will need an out of facility DNR form at the time of discharge Ongoing GOC conversation may be needed, as patient continues to have setbacks with multiple recent hospitalizations, consider  palliative care consult if not improving         Consultants: SLP PT OT TOC  team   Procedures: None   Antibiotics: Meropenem 1/12-13 Zosyn 1/13-14 Unasyn 1/17-    30 Day Unplanned Readmission Risk Score    Flowsheet Row Admission (Current) from 09/30/2023 in [facility]  30 Day Unplanned Readmission Risk Score (%) 32.04 Filed at 10/05/2023 0801       This score is the patient's risk of an unplanned readmission within 30 days of being discharged (0 -100%). The score is based on dignosis, age, lab data, medications, orders, and past utilization.   Low:  0-14.9   Medium: 15-21.9   High: 22-29.9   Extreme: 30 and above           Subjective: Rough night  - patient had fever to 101.3, AMS, slurred speech.  RN was concerned about CVA but imaging appears to be negative other than B lower lobe opacities, likely aspiration PNA following aspiration event on 1/15.   Objective: Vitals:   10/05/23 0431 10/05/23 0812  BP: 124/69 (!) 124/92  Pulse: 98 95  Resp: (!) 22 (!) 22  Temp: 98.8 F (37.1 C) 97.7 F (36.5 C)  SpO2: 99% 99%    Intake/Output Summary (Last 24 hours) at 10/05/2023 0834 Last data filed at 10/05/2023 0500 Gross per 24 hour  Intake 363.11 ml  Output 1350 ml  Net -986.89 ml   Filed Weights   09/30/23 1713 09/30/23 1720  Weight: 77.3 kg 77.3 kg    Exam:  General:  Appears fatigued with dysarthria Eyes:  EOMI, normal lids, iris ENT:  grossly normal hearing, lips & tongue, mmm Neck:  no LAD, masses or thyromegaly Cardiovascular:  RRR, no m/r/g. No LE edema.  Respiratory:   CTA bilaterally with no wheezes/rales/rhonchi.  Normal respiratory effort. Abdomen:  soft, NT, ND Skin:  no rash or induration seen on limited exam Musculoskeletal:  chronic RUE > RLE weakness, patient reports this is unchanged from baseline Psychiatric:  blunted mood and affect, speech dysarthric Neurologic:  chronic R facial droop, chronic R hemiparesis (upper > lower)  Data Reviewed: I have reviewed the patient's  lab results since admission.  Pertinent labs for today include:   CO2 16 Glucose 151 BUN 25/Creatinine 1.92/GFR 33 Anion gap 16 Lactate 1.2 Procalcitonin 0.4 WBC 13.3, up from 13.3 Blood cultures pending    Family Communication: None present; I spoke with his daughter by telephone on 2 occasions with updates  Disposition: Status is: Inpatient Remains inpatient appropriate because: ongoing evaluation and management     Time spent: 50 minutes  Unresulted Labs (From admission, onward)     Start     Ordered   10/05/23 0000  Culture, blood (x 2)  BLOOD CULTURE X 2,   R (with TIMED occurrences)     Comments: INITIATE ANTIBIOTICS WITHIN 1 HOUR AFTER BLOOD CULTURES DRAWN.  If unable to obtain blood cultures, call MD immediately regarding antibiotic instructions.    10/05/23 0003   10/05/23 0000  CBC with Differential  ONCE - STAT,   STAT       Question:  Specimen collection method  Answer:  Lab=Lab collect   10/05/23 0003   10/02/23 0500  Basic metabolic panel  Daily,   R     Question:  Specimen collection method  Answer:  Lab=Lab collect   10/01/23 1254   10/02/23 0500  CBC  Daily,   R  Question:  Specimen collection method  Answer:  Lab=Lab collect   10/01/23 1254             Author: Jonah Blue, MD 10/05/2023 8:34 AM  For on call review [email or web address].

## 2023-10-05 NOTE — Progress Notes (Signed)
On am assessment patient is alert oriented to speech is slurred.  Hospitalist contacted due to baseline uncertainty. Provider at bedside.

## 2023-10-05 NOTE — Progress Notes (Addendum)
    Patient Name: James KLEVEN, MD           DOB: 1931-04-10  MRN: [id number]      Admission Date: 09/30/2023  Attending Provider: Jonah Blue, MD  Primary Diagnosis: Acute metabolic encephalopathy   Level of care: Med-Surg    CROSS COVER NOTE   Date of Service   10/05/2023   James Sarks, MD, 89 y.o. male, was admitted on 09/30/2023 for Acute metabolic encephalopathy.    HPI/Events of Note   RN concerned regarding lethargic state. Currently tachycardic, low-grade fever. Per chart notes, patient has been lethargic during daytime as well. Rapid response RN at bedside.   Bedside Assessment:  Patient is lethargic, but responds to verbal stimulation.  Falls back asleep easily, and is able to follow simple commands.  Does not appear to be in acute distress.  He is currently wearing CPAP on room air.  Skin feels hot and dry, RN was instructed to obtain rectal temperature.  Now febrile with temperature of 101.9 F.  RN reporting new onset dry cough and possible aspiration event on 1/15?  Patient is supposed to be on nectar thick liquid, but thin liquid found at bedside.  Started on Unasyn for possible aspiration pneumonia given fever, cough, aspiration event.  Respiratory: Bilaterally clear, diminished lower breath sounds.  No wheezing, no crackles.  Slightly tachypneic. No accessory muscle use.  Cardiovascular: Regular rhythm.  Tachycardic.  No extremity edema. Abdomen: Abdomen is soft and nontender.  Positive bowel sounds in all quadrants.    Addendum: Lactic acid negative WBC 8.5--> now 13.3 Chest x-ray showing low lung volumes with mild atelectasis or infiltrate at the lung bases.   Interventions/ Plan   Tylenol as needed Work up --> chest x-ray, blood cultures, CBC, CMP, lactic acid, procalcitonin EKG Pulse ox Unasyn Incentive spirometry        Anthoney Harada, DNP, ACNPC- AG [facility]

## 2023-10-05 NOTE — Progress Notes (Addendum)
Speech Language Pathology Treatment: Dysphagia  Patient Details Name: James BOSLEY, MD MRN: [id number] DOB: 1930-11-15 Today's Date: 10/05/2023 Time: 4098-1191 SLP Time Calculation (min) (ACUTE ONLY): 19 min  Assessment / Plan / Recommendation Clinical Impression  SLP followed up with pt this afternoon. Note results of his imaging - negative for CVA and RUL ground glass opacities - thus likely aspiration pna. He is alert, with normal rate of breathing and improved articulation ability. Denies having head ache or fuzzy vision as he reported this am.    Remains with weak phonation. In discussion with pt, his greater risk of aspiration is with solids as he reports he cannot breathe through his nose - thus he reports inhales food. He states he is not eating solids due to this concern. He states he uses Afrin at home and requests it, MD advised she would order saline and a steroid for him - relayed this information to him. He also reports his liquid intake is compromised as he does not feel thirst and he finds the nectar thick unpalatable.     Thus per his request to maximize comfort, will order full liquid diet - with thickener ordered for PRN use *if pt requests to maximize comfort* and for diet advancement to dys3 if he desires. Would recommend to continue chin tuck posture for maximal airway protection.  Left oral suction within his reach for use PRN.   Will follow up next week in hopes to implement RMT to strengthen pt's cough and swallow for maximal efficiency in swallow and airway protection. Suspect his decreased mobility in the hospital and current ilness has impaired his ability to manage his low grade chronic aspiration. Informed RN and posted updated swallow precaution signs.     Pt may benefit from consideration for palliative consult in this SLPs opinion given advanced age, h/o CVA, and now aspiration pneumonia.    HPI HPI: Patient is a 89 y.o. male with PMH: GERD, hiatal hernia,  anemia, basal cell carcinoma, BPH, CKD, CVA (resultant dysphagia) , dehydration, depression, HLD, bilateral renal cysts, migraine headaches, OSA on CPAP, palpitations, presbycusis of both ears, restless leg syndrome, rosacea, TIA, type 2 diabetes, diabetic peripheral neuropathy. He was recently admitted at [facility] for 12 days due to Covid-19 and UTI (discharged home five days prior to current admission). He presented to [facility] via EMS due to decreased mentation and generalized weakness.  Pt underwent MBS on 10/01/2023 and follow up for dysphagia management indicated.  Pt aspirated sausage during hospital course and now has an aspiration pna.  He is being treated with ABX and was transferred to progressive.      SLP Plan  Continue with current plan of care      Recommendations for follow up therapy are one component of a multi-disciplinary discharge planning process, led by the attending physician.  Recommendations may be updated based on patient status, additional functional criteria and insurance authorization.    Recommendations  Diet recommendations: Thin liquid (full liquid per pt request, use nectar if pt requests) Liquids provided via: Straw;Cup;Teaspoon Medication Administration: Other (Comment) (with puree) Compensations: Slow rate;Small sips/bites Postural Changes and/or Swallow Maneuvers: Chin tuck (pt turns his head to the right with swallow)                  Oral care BID     Dysphagia, oropharyngeal phase (R13.12)     Continue with current plan of care    Rolena Infante, MS [facility] SLP Acute Rehab Services Office  [phone]  Chales Abrahams  10/05/2023, 6:47 PM

## 2023-10-05 NOTE — Progress Notes (Signed)
Rapid Response Event Note   Reason for Call : Lethargy, change in mental status, MEWS is yellow.  Primary nurse - Hermelinda Dellen, RN, concerned for aspiration d/t: low grade fever and worsening lethargy.    Initial Focused Assessment:  Patient is a frail 89 year old male, sleeping comfortably with nasal CPAP on and in NAD. Patient is a DNR/DNI with avoiding transfer to higher level of care. Patient moving all extremities, responds to his name, and follows simple commands. Patient does have a low grade fever with associated tachycardia.    Interventions:  None needed  Plan of Care:  Check rectal temperature and treat with rectal tylenol. Monitor vitals and level of distress.    Event Summary:   MD Notified: 2331 Call Time: 2330 Arrival Time: 2333 End Time: 0002

## 2023-10-05 NOTE — Progress Notes (Signed)
OT Cancellation Note  Patient Details Name: James MASAKI, MD MRN: [id number] DOB: 02-Feb-1931   Cancelled Treatment:    Reason Eval/Treat Not Completed: Medical issues which prohibited therapy: Pt with noted acute change in mental status per Nursing notes and CT of head ordered. SLP also about to work with pt, so OT will continue to monitor.   Theodoro Clock 10/05/2023, 10:33 AM

## 2023-10-05 NOTE — Progress Notes (Signed)
Speech Language Pathology Treatment: Dysphagia  Patient Details Name: James KLEINKNECHT, MD MRN: [id number] DOB: 1931/02/24 Today's Date: 10/05/2023 Time: 4098-1191 SLP Time Calculation (min) (ACUTE ONLY): 33 min  Assessment / Plan / Recommendation Clinical Impression  Pt seen today for skilled SLP to address his dysphagia goals. Unfortunately last pm, he was noted to have fevers and AMS requiring Bipap. Today pt with significantly worsened dysarthria, reports having head pain *does not notate location*, being "hot" and states his vision is "fuzzy". He is not managing his secretions resulting in left sided drooling. He is lethargic - having difficulty maintaining eye opening and head neutral position. Tongue and palatal elevation are midline. MD arrived and reports concern for asp pna from aspiration episode 2 days prior with sausage. Pt repeatedly requested "water" therefore SLP provided him with tsps and straw bolus of water. Tsps amounts were tolerated without overt indication of aspiration, however pt with overt congested coughing with use of straw. Pt was concerned about his medication administration and wanted his Neurontin - RN stated pt sneezed when attempting to take his medications this am and expectorated all of his medications. SLP at this time recommend npo x tsps of thin water with chin tuck and await results of brain imaging. Informed RN and NT of recommendation.   Dr Jeanie Sewer likely has h/o chronic aspiration based on his description of some coughing with intake and strategies of head turn right. Will follow up for dysphagia management and planned to being RMT today but these unfortunate events present this from being feasible.    HPI HPI: Patient is a 89 y.o. male with PMH: GERD, hiatal hernia, anemia, basal cell carcinoma, BPH, CKD, CVA (resultant dysphagia) , dehydration, depression, HLD, bilateral renal cysts, migraine headaches, OSA on CPAP, palpitations, presbycusis of both ears,  restless leg syndrome, rosacea, TIA, type 2 diabetes, diabetic peripheral neuropathy. He was recently admitted at [facility] for 12 days due to Covid-19 and UTI (discharged home five days prior to current admission). He presented to [facility] via EMS due to decreased mentation and generalized weakness.  Pt underwent MBS on 10/01/2023 and follow up for dysphagia management indicated.      SLP Plan  Continue with current plan of care      Recommendations for follow up therapy are one component of a multi-disciplinary discharge planning process, led by the attending physician.  Recommendations may be updated based on patient status, additional functional criteria and insurance authorization.    Recommendations  Diet recommendations:  (thin water via tsp with chin tuck) Liquids provided via: Teaspoon Medication Administration: Whole meds with puree Supervision: Full supervision/cueing for compensatory strategies Compensations: Slow rate Postural Changes and/or Swallow Maneuvers: Chin tuck                  Oral care QID   Frequent or constant Supervision/Assistance Dysphagia, oropharyngeal phase (R13.12)     Continue with current plan of care    Rolena Infante, MS [facility] SLP Acute Rehab Services Office [phone]  Chales Abrahams  10/05/2023, 10:58 AM

## 2023-10-05 NOTE — Telephone Encounter (Signed)
-----   Message from Riki Altes sent at 10/04/2023  8:34 AM EST ----- Go ahead and schedule next available cystoscopy and LUTS do a urine culture 1 week before that appointment ----- Message ----- From: Levada Schilling, CMA Sent: 09/13/2023   8:17 AM EST To: Riki Altes, MD  There is no open times for cysto ----- Message ----- From: Riki Altes, MD Sent: 09/12/2023   3:29 PM EST To: Levada Schilling, CMA  Please receive nodule cystoscopy sometime before January 15.  He will have completed his antibiotic course before 1/30.  If there is not a slot let me know and will work him in somewhere.

## 2023-10-05 NOTE — Progress Notes (Signed)
Patient in hospital will call after patient is out .

## 2023-10-05 NOTE — Telephone Encounter (Signed)
Patient in hospital at this time

## 2023-10-05 NOTE — Plan of Care (Signed)
  Problem: Education: Goal: Knowledge of General Education information will improve Description: Including pain rating scale, medication(s)/side effects and non-pharmacologic comfort measures Outcome: Progressing   Problem: Health Behavior/Discharge Planning: Goal: Ability to manage health-related needs will improve Outcome: Progressing   Problem: Clinical Measurements: Goal: Ability to maintain clinical measurements within normal limits will improve Outcome: Progressing Goal: Will remain free from infection Outcome: Progressing Goal: Diagnostic test results will improve Outcome: Progressing   Problem: Activity: Goal: Risk for activity intolerance will decrease Outcome: Progressing   Problem: Nutrition: Goal: Adequate nutrition will be maintained Outcome: Progressing   Problem: Elimination: Goal: Will not experience complications related to bowel motility Outcome: Progressing   Problem: Safety: Goal: Ability to remain free from injury will improve Outcome: Progressing   Problem: Skin Integrity: Goal: Risk for impaired skin integrity will decrease Outcome: Progressing   Problem: Metabolic: Goal: Ability to maintain appropriate glucose levels will improve Outcome: Progressing   Problem: Nutritional: Goal: Maintenance of adequate nutrition will improve Outcome: Progressing   Problem: Skin Integrity: Goal: Risk for impaired skin integrity will decrease Outcome: Progressing   Problem: Tissue Perfusion: Goal: Adequacy of tissue perfusion will improve Outcome: Progressing   Problem: Clinical Measurements: Goal: Respiratory complications will improve Outcome: Adequate for Discharge Goal: Cardiovascular complication will be avoided Outcome: Adequate for Discharge   Problem: Coping: Goal: Level of anxiety will decrease Outcome: Adequate for Discharge   Problem: Education: Goal: Knowledge of risk factors and measures for prevention of condition will  improve Outcome: Adequate for Discharge   Problem: Respiratory: Goal: Will maintain a patent airway Outcome: Adequate for Discharge

## 2023-10-06 DIAGNOSIS — Z711 Person with feared health complaint in whom no diagnosis is made: Secondary | ICD-10-CM

## 2023-10-06 DIAGNOSIS — G9341 Metabolic encephalopathy: Secondary | ICD-10-CM | POA: Diagnosis not present

## 2023-10-06 DIAGNOSIS — N179 Acute kidney failure, unspecified: Secondary | ICD-10-CM | POA: Diagnosis not present

## 2023-10-06 DIAGNOSIS — J690 Pneumonitis due to inhalation of food and vomit: Secondary | ICD-10-CM | POA: Diagnosis not present

## 2023-10-06 DIAGNOSIS — R451 Restlessness and agitation: Secondary | ICD-10-CM

## 2023-10-06 DIAGNOSIS — Z79899 Other long term (current) drug therapy: Secondary | ICD-10-CM

## 2023-10-06 DIAGNOSIS — R52 Pain, unspecified: Secondary | ICD-10-CM

## 2023-10-06 DIAGNOSIS — R4589 Other symptoms and signs involving emotional state: Secondary | ICD-10-CM

## 2023-10-06 DIAGNOSIS — R0602 Shortness of breath: Secondary | ICD-10-CM

## 2023-10-06 DIAGNOSIS — Z66 Do not resuscitate: Secondary | ICD-10-CM

## 2023-10-06 DIAGNOSIS — Z515 Encounter for palliative care: Secondary | ICD-10-CM | POA: Diagnosis not present

## 2023-10-06 DIAGNOSIS — Z7189 Other specified counseling: Secondary | ICD-10-CM

## 2023-10-06 LAB — CBC
HCT: 43.4 % (ref 39.0–52.0)
Hemoglobin: 14 g/dL (ref 13.0–17.0)
MCH: 29 pg (ref 26.0–34.0)
MCHC: 32.3 g/dL (ref 30.0–36.0)
MCV: 90 fL (ref 80.0–100.0)
Platelets: 204 10*3/uL (ref 150–400)
RBC: 4.82 MIL/uL (ref 4.22–5.81)
RDW: 13.4 % (ref 11.5–15.5)
WBC: 12.7 10*3/uL — ABNORMAL HIGH (ref 4.0–10.5)
nRBC: 0 % (ref 0.0–0.2)

## 2023-10-06 LAB — CULTURE, BLOOD (ROUTINE X 2)
Culture: NO GROWTH
Culture: NO GROWTH

## 2023-10-06 LAB — BASIC METABOLIC PANEL
Anion gap: 11 (ref 5–15)
BUN: 29 mg/dL — ABNORMAL HIGH (ref 8–23)
CO2: 24 mmol/L (ref 22–32)
Calcium: 8.3 mg/dL — ABNORMAL LOW (ref 8.9–10.3)
Chloride: 104 mmol/L (ref 98–111)
Creatinine, Ser: 1.7 mg/dL — ABNORMAL HIGH (ref 0.61–1.24)
GFR, Estimated: 38 mL/min — ABNORMAL LOW (ref >60)
Glucose, Bld: 154 mg/dL — ABNORMAL HIGH (ref 70–99)
Potassium: 3.5 mmol/L (ref 3.5–5.1)
Sodium: 139 mmol/L (ref 135–145)

## 2023-10-06 LAB — GLUCOSE, CAPILLARY
Glucose-Capillary: 147 mg/dL — ABNORMAL HIGH (ref 70–99)
Glucose-Capillary: 165 mg/dL — ABNORMAL HIGH (ref 70–99)

## 2023-10-06 MED ORDER — GLYCOPYRROLATE 0.2 MG/ML IJ SOLN
0.2000 mg | INTRAMUSCULAR | Status: DC | PRN
  Administered 2023-10-07: 0.2 mg via INTRAVENOUS
  Filled 2023-10-06: qty 1

## 2023-10-06 MED ORDER — MORPHINE 100MG IN NS 100ML (1MG/ML) PREMIX INFUSION
5.0000 mg/h | INTRAVENOUS | Status: DC
  Administered 2023-10-06 – 2023-10-07 (×2): 5 mg/h via INTRAVENOUS
  Filled 2023-10-06 (×2): qty 100

## 2023-10-06 MED ORDER — HYDROMORPHONE HCL 1 MG/ML IJ SOLN: 0.5000 mg | INTRAMUSCULAR | Status: DC | PRN

## 2023-10-06 MED ORDER — LACTATED RINGERS IV SOLN: INTRAVENOUS | Status: DC

## 2023-10-06 MED ORDER — DIPHENHYDRAMINE HCL 50 MG/ML IJ SOLN: 12.5000 mg | INTRAMUSCULAR | Status: DC | PRN

## 2023-10-06 MED ORDER — HALOPERIDOL LACTATE 5 MG/ML IJ SOLN: 0.5000 mg | INTRAMUSCULAR | Status: DC | PRN

## 2023-10-06 MED ORDER — POLYVINYL ALCOHOL 1.4 % OP SOLN: 1.0000 [drp] | Freq: Four times a day (QID) | OPHTHALMIC | Status: DC | PRN

## 2023-10-06 MED ORDER — MORPHINE BOLUS VIA INFUSION: 2.0000 mg | INTRAVENOUS | Status: DC | PRN

## 2023-10-06 MED ORDER — LORAZEPAM 2 MG/ML IJ SOLN: 0.5000 mg | INTRAMUSCULAR | Status: DC | PRN

## 2023-10-06 MED ORDER — BIOTENE DRY MOUTH MT LIQD: 15.0000 mL | OROMUCOSAL | Status: DC | PRN

## 2023-10-06 NOTE — Plan of Care (Signed)
  Problem: Pain Management: Goal: General experience of comfort will improve Outcome: Progressing   Problem: Skin Integrity: Goal: Risk for impaired skin integrity will decrease Outcome: Progressing   Problem: Respiratory: Goal: Will maintain a patent airway Outcome: Progressing   Problem: Education: Goal: Knowledge of General Education information will improve Description: Including pain rating scale, medication(s)/side effects and non-pharmacologic comfort measures Outcome: Not Progressing   Problem: Health Behavior/Discharge Planning: Goal: Ability to manage health-related needs will improve Outcome: Not Progressing   Problem: Clinical Measurements: Goal: Ability to maintain clinical measurements within normal limits will improve Outcome: Not Progressing Goal: Diagnostic test results will improve Outcome: Not Progressing   Problem: Activity: Goal: Risk for activity intolerance will decrease Outcome: Not Progressing   Problem: Nutrition: Goal: Adequate nutrition will be maintained Outcome: Not Progressing   Problem: Safety: Goal: Ability to remain free from injury will improve Outcome: Not Progressing   Problem: Fluid Volume: Goal: Ability to maintain a balanced intake and output will improve Outcome: Not Progressing   Problem: Nutritional: Goal: Maintenance of adequate nutrition will improve Outcome: Not Progressing

## 2023-10-06 NOTE — Progress Notes (Addendum)
Progress Note   Patient: James ZAWADA, MD EGB:[id number] DOB: 10/30/1930 DOA: 09/30/2023     6 DOS: the patient was seen and examined on 10/06/2023   Brief hospital course: 89yo with h/o HLD, DM, chronic diastolic CHF, CVA, hypothyroidism, dementia, BPH, stage 3b CKD, OSA on CPAP, and recent admission for VRE UTI who presented on 1/12 with AMS.  He was recently admitted from 12/26-1/7 for COVID and UTI with encephalopathy.  He is currently again being treated for UTI with Zosyn based on recent culture growing Morganella and Proteus.  Current urine and blood cultures are pending.  Assessment and Plan:  Dysphagia, probable recurrent aspiration PNA Evaluated by SLP on 1/15 He has chronic dysphagia and has been placed on a modified diet (dysphagia 2, nectar thick) Despite this, he reported an aspiration event 1/15 AM SLP reconsulted, changed to dysphagia 3 diet Developed fever, lethargy, leukocytosis in early AM on 1/17, started back on Unasyn for presumed aspiration PNA Blood cultures NTD x 1 day Diet changed to liquids Recurrent fever, tachycardia, lethargy this AM Will request palliative care consult    Acute metabolic encephalopathy Patient presenting with encephalopathy as evidenced by generalized weakness and some confusion His daughter reports that he does not have underlying dementia, although based on evaluations during prior hospitalization and current hospitalization I have concerns about mild dementia with waxing and waning mental status as well as FTT (nursing staff agrees that patient is "forgetful") While initial concern was for recurrent (MDR) UTI, urine culture grew <10k colonies and this seems less likely as the cause PT/OT/SLP consults -> SNF rehab Meanwhile, he developed worsening lethargy with dysarthria on 1/17 overnight and so CT and CTA head/neck were ordered and unremarkable; currently suspect that this is related to his aspiration event with developing  PNA Persistent lethargy appears to be related to ongoing acute illness   AKI on Stage 3b chronic kidney disease Recent baseline creatinine 1.4-1.9 Creatinine 2.4 on presentation, improved to 2.19 -> 2.15 -> 2.03 -> 1.91 -> 1.70 Likely mild dehydration with AKI on CKD, slowly improving Recheck BMP in AM Avoid nephrotoxic medications   Constipation Bowel regimen ordered    H/o UTI (urinary tract infection) Patient recently had VRE-UTI He has been treated with cefuroxime for Proteus mirabilis UTI recently Initial urinalysis done on 12/26 positive for leukocytes but negative for bacteria  Urine culture from 12/26 with Morganella Morgagni and Proteus Current urine culture with <10 k colonies, does not appear to need ongoing treatment at this time   History of CVA (cerebrovascular accident) Continue Crestor and Plavix Imaging survey negative for acute CVA on admission and again on 1/17   Hyperlipidemia associated with type 2 diabetes mellitus (HCC) Continue Crestor   HTN (hypertension) IV hydralazine as needed   Type II diabetes mellitus with renal manifestations Recent A1c 6.7, well-controlled - his A1c goal is <8 Patient previously taking NovoLog, semaglutide, Tresiba 80 units daily Hold semglee for now Sensitive sliding scale Avoid hypoglycemia Continue gabapentin   Chronic diastolic CHF (congestive heart failure)  2D echo on 01/02/2022 showed EF of 55-60% CHF seem to be compensated   Hypothyroidism Continue Synthroid   Restless leg syndrome, severe Continue Pramipexole   BPH with obstruction/lower urinary tract symptoms Continue tamsulosin   OSA Continue CPAP nightly   Glaucoma Continue Combigan   Mood d/o Continue duloxetine   DNR Patient confirmed to be DNR He will need an out of facility DNR form at the time of discharge Ongoing GOC conversation  may be needed, as patient continues to have setbacks with multiple recent hospitalizations; palliative care  consulted Patient has had hospitalization for almost a month now and may be nearing the end of his life, unfortunately         Consultants: Palliative SLP PT OT TOC team   Procedures: None   Antibiotics: Meropenem 1/12-13 Zosyn 1/13-14 Unasyn 1/17-  30 Day Unplanned Readmission Risk Score    Flowsheet Row Admission (Current) from 09/30/2023 in [facility]TH FLOOR PROGRESSIVE CARE AND UROLOGY  30 Day Unplanned Readmission Risk Score (%) 32.9 Filed at 10/06/2023 0400       This score is the patient's risk of an unplanned readmission within 30 days of being discharged (0 -100%). The score is based on dignosis, age, lab data, medications, orders, and past utilization.   Low:  0-14.9   Medium: 15-21.9   High: 22-29.9   Extreme: 30 and above           Subjective: Lethargic this AM, febrile on my exam.   Objective: Vitals:   10/06/23 0922 10/06/23 1114  BP: 116/61 (!) 111/58  Pulse: (!) 117 (!) 116  Resp: (!) 29 16  Temp: (!) 101 F (38.3 C) 99.4 F (37.4 C)  SpO2:  92%    Intake/Output Summary (Last 24 hours) at 10/06/2023 1207 Last data filed at 10/06/2023 0200 Gross per 24 hour  Intake 300 ml  Output 650 ml  Net -350 ml   Filed Weights   09/30/23 1713 09/30/23 1720  Weight: 77.3 kg 77.3 kg    Exam:  General:  Somnolent, poorly responsive (likely related to acute fever - nurse notified and temp 101) Eyes:   normal lids, closed throughout ENT:  grossly normal lips & tongue, mmm Neck:  no LAD, masses or thyromegaly Cardiovascular:  RRR, no m/r/g. No LE edema.  Respiratory:   CTA bilaterally with no wheezes/rales/rhonchi.  Normal respiratory effort. Abdomen:  soft, NT, ND Skin:  no rash or induration seen on limited exam Musculoskeletal:  chronic RUE > RLE weakness Psychiatric:  somnolent, less responsive (but actively febrile) Neurologic:  chronic R facial droop, chronic R hemiparesis (upper > lower)  Data Reviewed: I have reviewed the patient's  lab results since admission.  Pertinent labs for today include:   Glucose 159 BUN 29/Creatinine 1.7/GFR 38, slowly I mproving WBC 12.7     Family Communication: None present; I spoke with his daughter by telephone  Disposition: Status is: Inpatient Remains inpatient appropriate because: he is still acutely ill     Time spent: 50 minutes  Unresulted Labs (From admission, onward)     Start     Ordered   10/07/23 0500  CBC with Differential/Platelet  Daily,   R     Question:  Specimen collection method  Answer:  Lab=Lab collect   10/06/23 1206   10/07/23 0500  Basic metabolic panel  Daily,   R     Question:  Specimen collection method  Answer:  Lab=Lab collect   10/06/23 1206             Author: Jonah Blue, MD 10/06/2023 12:07 PM  For on call review [email or web address].

## 2023-10-06 NOTE — Progress Notes (Signed)
PT Cancellation Note  Patient Details Name: James LUNN, MD MRN: [id number] DOB: June 08, 1931   Cancelled Treatment:     PT attempted this am but pt very lethargic - opened eyes twice, squeezed fingers once and back to sleep.  Will follow.   [name] 10/06/2023, 12:11 PM

## 2023-10-06 NOTE — Consult Note (Signed)
Consultation Note Date: 10/06/2023   Patient Name: James MAHNKEN, MD  DOB: 1931-02-02  MRN: [id number]  Age / Sex: 89 y.o., male   PCP: Eustaquio Boyden, MD Referring Physician: Jonah Blue, MD  Reason for Consultation: Establishing goals of care     Chief Complaint/History of Present Illness:   Patient is a 89 year old male with a past medical history of hyperlipidemia, diabetes, HFpEF, prior CVA, hypothyroidism, dementia, BPH, CKD, OSA on CPAP, chronic dysphagia, and recent admission for VRE UTI who was admitted on 09/30/2023 for altered mental status.  Patient had also had recent admission for management of COVID, UTI, and encephalopathy.  During this hospitalization patient again receiving management for UTI.  Palliative medicine team consulted to assist with complex medical decision making.  Extensive review of EMR prior to presenting to bedside.  Discussed care with speech therapist for medical updates regarding dysphagia.  Upon review of EMR, patient noted to be tachycardic with increased temperature of 101 Fahrenheit this morning.  Presented to bedside to see patient.  Patient laying in bed sleeping.  No family present at bedside.  Patient will awaken to voice though appears incredibly lethargic and quickly falls back asleep.  Patient noted to have increased work of breathing during examination.  Patient mumbling at times though not able to communicate.  Patient appears ill.  Sat with patient for a while to provide support as patient would occasionally awaken and looks scared.  Allow time for patient to fall back asleep prior to exiting room.  Discussed care with hospitalist and RN after visit.  They noted patient has acutely deteriorated today despite appropriate medical management.  Able to call and speak with patient's listed NOK/daughter, James Bell.  Introduced myself and the role of the palliative medicine team.  Spent time reviewing patient's medical illness and  deterioration despite appropriate medical management.  With permission, expressed concern that patient is reaching the end of life.  Discussed transitioning to comfort focused care at this time and what this would entail.  Discussed would discontinue interventions that are no longer focused on comfort such as antibiotics, IV fluids, and imaging.  Will instead provide medications for pain, dyspnea, and agitation.  Daughter agreed with transition to comfort focused care at this time.  Daughter planning to reach out to other family members about visiting.  Discussed based on how patient does wants to transition to comfort focused care, consider hospice involvement.  Patient had been in independent living with 24/7 caregiver help.  Based on current examination, concerned patient will need inpatient hospice if appropriate for transferring.  Concerned patient may have in-hospital death.  Daughter acknowledges this.  Daughter noted that she and her father had previously discussed hospice and her father noted that it "seemed like a beautiful way to go".  His priority would be comfort at this time knowing he is irreversible medical conditions.  Daughter appropriately tearful during conversation.  Provided emotional support be active listening.  Did inquire if patient was spiritual to which daughter noted that he is, does not particularly associated with a certain religion.  Noted would involve chaplain for support.  Answered all questions as able at that time.  Noted palliative medicine team will continue to follow along with patient's medical journey.  Updated IDT regarding transition to comfort focused care at this time.  Primary Diagnoses  Present on Admission:  UTI (urinary tract infection)  Type 2 diabetes mellitus with hyperglycemia (HCC)  Chronic diastolic CHF (congestive heart failure) (HCC)  Chronic  kidney disease, stage 3b (HCC)  GERD (gastroesophageal reflux disease)  Hypothyroidism  Acute kidney  injury (HCC)  Glaucoma  BPH with obstruction/lower urinary tract symptoms  Restless leg syndrome, severe  Acute metabolic encephalopathy  Aspiration pneumonia (HCC)   Palliative Review of Systems: Increased work of breathing noted, appears agitated  Past Medical History:  Diagnosis Date   Anemia    Basal cell carcinoma 2018   R temple    Basal cell carcinoma of nose 2002   bridge of nose   BPH with obstruction/lower urinary tract symptoms    failed flomax, uroxatral, myrbetriq - sees urology Patsi Sears) pending videourodynamics   CKD (chronic kidney disease) stage 3, GFR 30-59 ml/min (HCC) 03/11/2012   Renal US 02/2014 - multiple kidney cysts    CVA (cerebral infarction) 03/07/2012   slurred speech; right hemplegia, left handed - completed PT 07/2014 (17 sessions)   Dehydration    Depression    mild, on cymbalta per prior PCP   Erectile dysfunction    per urology (prostaglandin injection)   GERD (gastroesophageal reflux disease)    with sliding HH   H/O hiatal hernia    History of kidney problems 12/2010   lacerated left kidney after fall   HLD (hyperlipidemia)    Hx of basal cell carcinoma 07/10/2017   R. lat. forehead near hair line   Kidney cysts 02/2014   bilateral (renal US)   Migraines 03/11/2012   now rare   OSA on CPAP    sleep study 01/2014 - severe, CPAP at 12, previously on nuvigil (Dr. Gareth Morgan)   Palpitations 01/2012   holter WNL, rare PAC/PVCs   Presbycusis of both ears 2016   rec amplification [facility])   Restless leg syndrome    well controlled on mirapex   Rosacea    Gwen Pounds   TIA (transient ischemic attack)    Type 2 diabetes, controlled, with neuropathy (HCC) 2011   Dr Sharl Ma in [place]   Social History   Socioeconomic History   Marital status: Divorced    Spouse name: Not on file   Number of children: 2   Years of education: MD   Highest education level: Not on file  Occupational History   Occupation: retired    Associate Professor: RETIRED   Tobacco Use   Smoking status: Never    Passive exposure: Never   Smokeless tobacco: Never   Tobacco comments:    "used to smoke pipe when I was youngerSystems developer   Vaping status: Never Used  Substance and Sexual Activity   Alcohol use: Not Currently    Alcohol/week: 4.0 standard drinks of alcohol    Types: 2 Glasses of wine, 2 Shots of liquor per week    Comment: "nonexistent anymore"   Drug use: No   Sexual activity: Yes  Other Topics Concern   Not on file  Social History Narrative   Moved into [facility] in [place] 12/2020   Caffeine: 1 cup coffee/day     Occupation: ophthalmologist, retired   Edu: MD   Activity: going to gym 3x/wk with trainer   Diet: some water, fruits/vegetables some, red meat a few times, fish 1x/wk   POA - James Bell (Daughter)   Social Drivers of Health   Financial Resource Strain: Low Risk  (12/30/2020)   Overall Financial Resource Strain (CARDIA)    Difficulty of Paying Living Expenses: Not hard at all  Food Insecurity: No Food Insecurity (10/04/2023)   Hunger Vital Sign  Worried About Programme researcher, broadcasting/film/video in the Last Year: Never true    Ran Out of Food in the Last Year: Never true  Transportation Needs: No Transportation Needs (10/04/2023)   PRAPARE - Administrator, Civil Service (Medical): No    Lack of Transportation (Non-Medical): No  Physical Activity: Not on file  Stress: Not on file  Social Connections: Socially Isolated (09/30/2023)   Social Connection and Isolation Panel [NHANES]    Frequency of Communication with Friends and Family: More than three times a week    Frequency of Social Gatherings with Friends and Family: Once a week    Attends Religious Services: Never    Database administrator or Organizations: No    Attends Engineer, structural: Never    Marital Status: Divorced   Family History  Problem Relation Age of Onset   Stroke Father    Lung cancer Father 47         smoker   Alcoholism Sister    Kidney disease Sister    Stroke Brother    Diabetes Brother 3   Lung cancer Brother    Coronary artery disease Neg Hx    Sleep apnea Neg Hx    Scheduled Meds:  brimonidine  1 drop Both Eyes BID   clopidogrel  75 mg Oral Daily   docusate sodium  100 mg Oral BID   DULoxetine  60 mg Oral Daily   famotidine  20 mg Oral QHS   feeding supplement  237 mL Oral BID BM   fluticasone  2 spray Each Nare Daily   gabapentin  300 mg Oral BID   heparin injection (subcutaneous)  5,000 Units Subcutaneous Q8H   insulin aspart  0-9 Units Subcutaneous TID WC   levothyroxine  50 mcg Oral Q0600   pantoprazole  40 mg Oral Daily   pramipexole  1 mg Oral QAC supper   rosuvastatin  10 mg Oral Daily   tamsulosin  0.4 mg Oral QPC supper   timolol  1 drop Both Eyes BID   Continuous Infusions:  ampicillin-sulbactam (UNASYN) IV 3 g (10/06/23 1150)   lactated ringers     PRN Meds:.acetaminophen **OR** acetaminophen, bisacodyl, food thickener, guaiFENesin-dextromethorphan, ondansetron **OR** ondansetron (ZOFRAN) IV, polyethylene glycol, sodium chloride Allergies  Allergen Reactions   Dulaglutide     Abdominal Pain, upset stomach     Empagliflozin Other (See Comments)    yeast infection   Lisinopril Cough    tachycardia   Nexium [Esomeprazole] Other (See Comments)    Headache   Oxycodone Other (See Comments)    Even 1/2 tablet of 5mg  caused increased confusion - contributory to hospitalization 10/2020 with AMS   Pregabalin Nausea Only and Other (See Comments)     MALAISE, SLIGHT NAUSEA, SLIGHT HEADACHE     Rosuvastatin Other (See Comments)     muscle pain 2.16.2022 Pt is current on this medication without any issues.   CBC:    Component Value Date/Time   WBC 12.7 (H) 10/06/2023 0452   HGB 14.0 10/06/2023 0452   HCT 43.4 10/06/2023 0452   PLT 204 10/06/2023 0452   MCV 90.0 10/06/2023 0452   NEUTROABS 6.5 09/22/2023 0436   LYMPHSABS 2.6 09/22/2023 0436    MONOABS 1.0 09/22/2023 0436   EOSABS 0.1 09/22/2023 0436   BASOSABS 0.0 09/22/2023 0436   Comprehensive Metabolic Panel:    Component Value Date/Time   NA 139 10/06/2023 0452   NA 139 12/05/2019 0000  K 3.5 10/06/2023 0452   CL 104 10/06/2023 0452   CO2 24 10/06/2023 0452   BUN 29 (H) 10/06/2023 0452   CREATININE 1.70 (H) 10/06/2023 0452   GLUCOSE 154 (H) 10/06/2023 0452   CALCIUM 8.3 (L) 10/06/2023 0452   AST 19 10/05/2023 0111   ALT 12 10/05/2023 0111   ALKPHOS 52 10/05/2023 0111   BILITOT 1.1 10/05/2023 0111   PROT 7.3 10/05/2023 0111   ALBUMIN 2.8 (L) 10/05/2023 0111    Physical Exam: Vital Signs: BP (!) 111/58 (BP Location: Left Arm)   Pulse (!) 116   Temp 99.4 F (37.4 C) (Oral)   Resp 16   Ht 5\' 8"  (1.727 m)   Wt 77.3 kg   SpO2 92%   BMI 25.91 kg/m  SpO2: SpO2: 92 % O2 Device: O2 Device: Room Air O2 Flow Rate:   Intake/output summary:  Intake/Output Summary (Last 24 hours) at 10/06/2023 1207 Last data filed at 10/06/2023 0200 Gross per 24 hour  Intake 300 ml  Output 650 ml  Net -350 ml   LBM: Last BM Date : 10/05/23 Baseline Weight: Weight: 77.3 kg Most recent weight: Weight: 77.3 kg  General: Ill-appearing, lethargic, frail Cardiovascular: Tachycardia noted Respiratory: increased work of breathing noted Neuro: Lethargic          Palliative Performance Scale: 10%              Additional Data Reviewed: Recent Labs    10/05/23 0111 10/06/23 0452  WBC 13.3* 12.7*  HGB 14.8 14.0  PLT 194 204  NA 140 139  BUN 25* 29*  CREATININE 1.92* 1.70*    Imaging: CT ANGIO HEAD NECK W WO CM W PERF CLINICAL DATA:  Encephalopathy, generalized weakness, confusion  EXAM: CT ANGIOGRAPHY HEAD AND NECK  TECHNIQUE: Multidetector CT imaging of the head and neck was performed using the standard protocol during bolus administration of intravenous contrast. Multiplanar CT image reconstructions and MIPs were obtained to evaluate the vascular anatomy. Carotid  stenosis measurements (when applicable) are obtained utilizing NASCET criteria, using the distal internal carotid diameter as the denominator.  RADIATION DOSE REDUCTION: This exam was performed according to the departmental dose-optimization program which includes automated exposure control, adjustment of the mA and/or kV according to patient size and/or use of iterative reconstruction technique.  CONTRAST:  60mL OMNIPAQUE IOHEXOL 350 MG/ML SOLN  COMPARISON:  09/30/2023 CT head, 09/13/2023 CTA head and neck  FINDINGS: CT HEAD FINDINGS  Brain: No evidence of acute infarct, hemorrhage, mass, mass effect, or midline shift. No hydrocephalus or extra-axial fluid collection. Age related cerebral atrophy. Redemonstrated remote infarct involving the posterior left lentiform nucleus. Periventricular white matter changes, likely the sequela of chronic small vessel ischemic disease.  Vascular: No hyperdense vessel. Atherosclerotic calcifications in the intracranial carotid and vertebral arteries.  Skull: Negative for fracture or focal lesion.  Sinuses/Orbits: Mucosal thickening in the ethmoid air cells. Status post bilateral lens replacements.  Other: The mastoid air cells are well aerated.  CTA NECK FINDINGS  Aortic arch: Standard branching. Imaged portion shows no evidence of aneurysm or dissection. No significant stenosis of the major arch vessel origins. Aortic atherosclerosis.  Right carotid system: No evidence of dissection, occlusion, or hemodynamically significant stenosis (greater than 50%).  Left carotid system: No evidence of dissection, occlusion, or hemodynamically significant stenosis (greater than 50%).  Vertebral arteries: No evidence of dissection, occlusion, or hemodynamically significant stenosis (greater than 50%).  Skeleton: No acute osseous abnormality. Degenerative changes in the cervical spine.  Other neck: No acute finding.  Upper chest: Mild  ground-glass opacities in the anterior right upper lobe (series 15, image 7).  Review of the MIP images confirms the above findings  CTA HEAD FINDINGS  Anterior circulation: Both internal carotid arteries are patent to the termini, with calcifications but without significant stenosis.  A1 segments patent. Normal anterior communicating artery. Anterior cerebral arteries are patent to their distal aspects without significant stenosis.  Mild stenosis in the proximal left M 1 (series 15, image 202). No significant stenosis in the right M1. MCA branches perfused to their distal aspects without significant stenosis.  Posterior circulation: Vertebral arteries patent to the vertebrobasilar junction without significant stenosis. Posterior inferior cerebellar arteries patent proximally.  Basilar patent to its distal aspect with mild stenosis distally (series 15, image 197). Superior cerebellar arteries patent proximally.  Redemonstrated multifocal stenosis in the bilateral PCAs, which is mild proximal right P2, moderate to severe in the mid to distal right P2, and severe in the more medial right P3. Mild stenosis in the left P1 and proximal left P2, and severe stenosis in the distal left P2 extending into the left P3 and in a more distal left PCA branch. This appears overall unchanged from the prior exam. The bilateral posterior communicating arteries are not visualized.  Venous sinuses: As permitted by contrast timing, patent.  Anatomic variants: None significant.  No evidence of aneurysm or vascular malformation.  Review of the MIP images confirms the above findings  IMPRESSION: 1. No acute intracranial process. 2. No intracranial large vessel occlusion. Redemonstrated multifocal stenosis in the bilateral PCAs, [place] which is severe, overall unchanged from the prior exam. 3. No hemodynamically significant stenosis in the neck. 4. Mild ground-glass opacities in the anterior  right upper lobe, which could be infectious or inflammatory. 5. Aortic atherosclerosis.  Aortic Atherosclerosis (ICD10-I70.0).  Electronically Signed   By: Wiliam Ke M.D.   On: 10/05/2023 12:35 DG Chest Port 1 View CLINICAL DATA:  Aspiration.  EXAM: PORTABLE CHEST 1 VIEW  COMPARISON:  10/15/2023.  FINDINGS: The heart size and mediastinal contours are within normal limits. Lung volumes are low with mild atelectasis or infiltrate at the lung bases. No effusion or pneumothorax is seen. No acute osseous abnormality.  IMPRESSION: Low lung volumes with atelectasis or infiltrate at the lung bases.  Electronically Signed   By: Thornell Sartorius M.D.   On: 10/05/2023 00:43    I personally reviewed recent imaging.   Palliative Care Assessment and Plan Summary of Established Goals of Care and Medical Treatment Preferences   Patient is a 89 year old male with a past medical history of hyperlipidemia, diabetes, HFpEF, prior CVA, hypothyroidism, dementia, BPH, CKD, OSA on CPAP, chronic dysphagia, and recent admission for VRE UTI who was admitted on 09/30/2023 for altered mental status.  Patient had also had recent admission for management of COVID, UTI, and encephalopathy.  During this hospitalization patient again receiving management for UTI.  Palliative medicine team consulted to assist with complex medical decision making.  # Complex medical decision making/goals of care  -Patient unable to participate in medical decision making secondary to mental status.  -Spoke with patient's NOK/daughter, James Bell, and as detailed above in HPI.  Despite appropriate medical management, patient's medical status continuing to deteriorate.  Patient has chronic medical conditions including aspiration which cannot be resolved.  Discussed care with daughter.  At this time transition to comfort focused care as concerned patient is reaching end-of-life.  -At this time we will discontinue  interventions  that are no longer focused on comfort such as IV fluids, imaging, or lab work.  Will instead focus on symptom management of pain, dyspnea, and agitation in the setting of end-of-life care.    Code Status: Do not attempt resuscitation (DNR) - Comfort care  # Symptom management    -Pain/Dyspnea, acute in the setting of end-of-life care                Patient was not on medications for pain previously.                               -Start IV Dilaudid 0.5-2 mg IV every 30 minutes as needed.  Continue to adjust based on patient's symptom burden.  If patient needing frequent dosing, may need to consider continuous infusion.                  -Anxiety/agitation, in the setting of end-of-life care                               -Start IV Ativan 0.5 mg every 4 hours as needed. Continue to adjust based on patient's symptom burden.                                 -Start IV Haldol 0.5 mg every 4 hours as needed. Continue to adjust based on patient's symptom burden.                   -Secretions, in the setting of end-of-life care                               -Start IV glycopyrrolate 0.2 mg every 4 hours as needed.  # Psycho-social/Spiritual Support:  - Support System: Daughter -Placed consult for chaplain  # Discharge Planning:  Anticipated Hospital Death at this time.  If patient stabilizes for transfer, consider inpatient hospice referral.  At this time continuing comfort care in the hospital  Thank you for allowing the palliative care team to participate in the care [name], MD.  Alvester Morin, DO Palliative Care Provider PMT # [phone]  If patient remains symptomatic despite maximum doses, please call PMT at [phone] between 0700 and 1900. Outside of these hours, please call attending, as PMT does not have night coverage.

## 2023-10-07 DIAGNOSIS — J690 Pneumonitis due to inhalation of food and vomit: Secondary | ICD-10-CM | POA: Diagnosis not present

## 2023-10-07 DIAGNOSIS — Z7189 Other specified counseling: Secondary | ICD-10-CM | POA: Diagnosis not present

## 2023-10-07 DIAGNOSIS — R4589 Other symptoms and signs involving emotional state: Secondary | ICD-10-CM

## 2023-10-07 DIAGNOSIS — Z515 Encounter for palliative care: Secondary | ICD-10-CM

## 2023-10-07 DIAGNOSIS — R0602 Shortness of breath: Secondary | ICD-10-CM

## 2023-10-07 DIAGNOSIS — Z711 Person with feared health complaint in whom no diagnosis is made: Secondary | ICD-10-CM

## 2023-10-07 DIAGNOSIS — R451 Restlessness and agitation: Secondary | ICD-10-CM

## 2023-10-07 DIAGNOSIS — R52 Pain, unspecified: Secondary | ICD-10-CM

## 2023-10-07 DIAGNOSIS — Z66 Do not resuscitate: Secondary | ICD-10-CM

## 2023-10-07 DIAGNOSIS — Z79899 Other long term (current) drug therapy: Secondary | ICD-10-CM

## 2023-10-07 MED ORDER — ACETAMINOPHEN 650 MG RE SUPP
650.0000 mg | Freq: Once | RECTAL | Status: AC | PRN
  Administered 2023-10-07: 650 mg via RECTAL
  Filled 2023-10-07: qty 1

## 2023-10-07 NOTE — Progress Notes (Addendum)
Daily Progress Note   Patient Name: James Sarks, MD       Date: 10/07/2023 DOB: 08/31/1931  Age: 89 y.o. MRN#: [id number] Attending Physician: Jonah Blue, MD Primary Care Physician: Eustaquio Boyden, MD Admit Date: 09/30/2023 Length of Stay: 7 days  Reason for Consultation/Follow-up: Establishing goals of care  Subjective:   CC: Patient laying in bed unresponsive.  No family present at bedside.  Following up regarding complex medical decision making.  Subjective:  Reviewed EMR prior to presenting to bedside.  Hospitalist had discussed care with daughter at bedside yesterday later in afternoon.  Patient started on IV morphine continuous infusion at 5 mg/h.  Patient has not required any boluses.  EMR review this morning did show patient had fever of 103.2 F and noted to be hypotensive.  Presented to bedside to see patient.  No family present at bedside.  Patient laying in bed unresponsive.  Patient does not appear to have any signs of nonverbal pain or increased work of breathing.  Continues to receive IV morphine infusion.  Discussed care with IDT.  Anticipate hospital death at this time as patient does not appears stable for transfer to inpatient hospice facility.  UPDATE: Presented to bedside in afternoon when patient's daughter, niece, and friend present. Reviewed current medical status. Discussed based on current examination, would anticipate hospital death. Daughter agreeing with this plan. Normalized aspects associated with end of life such as terminal secretions and how to monitor work of breathing. Patient continues to appear comfortable at this time. Will provide with dose of IV glycopyrrolate. Daughter inquired about what would happen once patient passed, discussed this. RN to obtain funeral information from daughter since this is already determined. Daughter stated that her sister from [facility] would be getting in tomorrow evening. Discussed could be hours to short-days.  Any family who can come/want to visit should.  Daughter also mentioned that the patient's phone has been missing and can be found through tracking in [place]. Noted would follow up with RN about process regarding this. Likely requires charge RN involvement. Spent time providing support via active listening. Thanked them for allowing me to visit today. Noted PMT would continue to follow along.   Objective:   Vital Signs:  BP (!) 77/43 (BP Location: Left Arm)   Pulse (!) 125   Temp (!) 103.2 F (39.6 C) (Axillary)   Resp 10   Ht 5\' 8"  (1.727 m)   Wt 77.3 kg   SpO2 100%   BMI 25.91 kg/m   Physical Exam: General: Ill-appearing, unresponsive, frail Cardiovascular: Tachycardia noted Respiratory: No increased work of breathing noted Neuro: Unresponsive  Imaging: I personally reviewed recent imaging.   Assessment & Plan:   Assessment: Patient is a 89 year old male with a past medical history of hyperlipidemia, diabetes, HFpEF, prior CVA, hypothyroidism, dementia, BPH, CKD, OSA on CPAP, chronic dysphagia, and recent admission for VRE UTI who was admitted on 09/30/2023 for altered mental status. Patient had also had recent admission for management of COVID, UTI, and encephalopathy. During this hospitalization patient again receiving management for UTI. Palliative medicine team consulted to assist with complex medical decision making.   Recommendations/Plan: # Complex medical decision making/goals of care:  Patient unable to participate in medical decision making secondary to mental status.                -Patient was transitioned to full comfort focused care on 10/06/2023 after discussion with NOK/daughter, Arlis Porta. Updated daughter later at bedside s detailed above  in HPI.  Based on examination at this time, hospital death anticipated.                  Code Status: Do not attempt resuscitation (DNR) - Comfort care   # Symptom management                  -Pain/Dyspnea, acute in  the setting of end-of-life care                -Continue IV morphine infusion with continuous rate 5 mg/h and bolus of 2 mg every 15 minute as needed.                 -Anxiety/agitation, in the setting of end-of-life care                               -Continue IV Ativan 0.5 mg every 4 hours as needed. Continue to adjust based on patient's symptom burden.                                 -Continue IV Haldol 0.5 mg every 4 hours as needed. Continue to adjust based on patient's symptom burden.                   -Secretions, in the setting of end-of-life care                               -Continue IV glycopyrrolate 0.2 mg every 4 hours as needed.   # Psycho-social/Spiritual Support:  - Support System: Daughter -Already placed consult for chaplain   # Discharge Planning:  Anticipated Hospital Death at this time.    Discussed with: RN, hospitalist  Thank you for allowing the palliative care team to participate in the care James Sarks, MD.  Alvester Morin, DO Palliative Care Provider PMT # [phone]  If patient remains symptomatic despite maximum doses, please call PMT at [phone] between 0700 and 1900. Outside of these hours, please call attending, as PMT does not have night coverage.  Personally spent 37 minutes in patient care including extensive chart review (labs, imaging, progress/consult notes, vital signs), medically appropraite exam, discussed with treatment team, education to patient, family, and staff, documenting clinical information, medication review and management, coordination of care, and available advanced directive documents.

## 2023-10-07 NOTE — Progress Notes (Signed)
Progress Note   Patient: James SCHOOF, MD VZD:[id number] DOB: April 14, 1931 DOA: 09/30/2023     7 DOS: the patient was seen and examined on 10/07/2023   Brief hospital course: 89yo with h/o HLD, DM, chronic diastolic CHF, CVA, hypothyroidism, dementia, BPH, stage 3b CKD, OSA on CPAP, and recent admission for VRE UTI who presented on 1/12 with AMS.  He was recently admitted from 12/26-1/7 for COVID and UTI with encephalopathy.  Persistent aspiration, developed aspiration PNA with worsening encephalopathy.  Palliative care consulted, transitioned to comfort measures.  Anticipate in-hospital death.  Assessment and Plan:  Dysphagia with recurrent aspiration PNA He has chronic dysphagia and had been placed on a modified diet (dysphagia 2, nectar thick) Despite this, he reported an aspiration event 1/15 AM SLP reconsulted, changed to dysphagia 3 diet Developed fever, lethargy, leukocytosis in early AM on 1/17, started back on Unasyn for presumed aspiration PNA Recurrent fever, tachycardia, lethargy 1/18 With assistance from palliative care, patient was transitioned to comfort care After further discussions with family, he was started on continuous morphine infusion Anticipate in-hospital death   Acute metabolic encephalopathy Patient presenting with encephalopathy as evidenced by generalized weakness and some confusion His daughter reports that he does not have underlying dementia, although based on evaluations during prior hospitalization and current hospitalization I have concerns about mild dementia with waxing and waning mental status as well as FTT (nursing staff agrees that patient is "forgetful") While initial concern was for recurrent (MDR) UTI, urine culture grew <10k colonies PT/OT/SLP consults -> SNF rehab Meanwhile, he developed worsening lethargy with dysarthria associated with aspiration PNA Persistent lethargy appears to be related to ongoing acute illness    DNR/GOC Patient  confirmed to be DNR on admission Patient continued to have setbacks with multiple recent hospitalizations; palliative care consulted Patient has had hospitalization for almost a month now  Based on ongoing GOC conversations, he is now on comfort measures Anticipate in-hospital death         Consultants: Palliative SLP PT OT TOC team   Procedures: None   Antibiotics: Meropenem 1/12-13 Zosyn 1/13-14 Unasyn 1/17-18  30 Day Unplanned Readmission Risk Score    Flowsheet Row Admission (Current) from 09/30/2023 in [facility]TH FLOOR PROGRESSIVE CARE AND UROLOGY  30 Day Unplanned Readmission Risk Score (%) 32.54 Filed at 10/07/2023 0401       This score is the patient's risk of an unplanned readmission within 30 days of being discharged (0 -100%). The score is based on dignosis, age, lab data, medications, orders, and past utilization.   Low:  0-14.9   Medium: 15-21.9   High: 22-29.9   Extreme: 30 and above           Subjective: Episodes of apnea.  Appears comfortable.   Objective: Vitals:   10/07/23 1042 10/07/23 1253  BP:  (!) 69/45  Pulse:  79  Resp:  10  Temp: (!) 100.6 F (38.1 C) 100.2 F (37.9 C)  SpO2:      Intake/Output Summary (Last 24 hours) at 10/07/2023 1510 Last data filed at 10/07/2023 0600 Gross per 24 hour  Intake 116.6 ml  Output --  Net 116.6 ml   Filed Weights   09/30/23 1713 09/30/23 1720  Weight: 77.3 kg 77.3 kg    Exam:  General:  Somnolent, poorly responsive (likely related to acute fever - nurse notified and temp 101) Eyes:   normal lids, closed throughout ENT:  grossly normal lips & tongue, mmm Neck:  no LAD,  masses or thyromegaly Cardiovascular:  RRR, no m/r/g. No LE edema.  Respiratory:   CTA bilaterally with no wheezes/rales/rhonchi.  Normal respiratory effort. Abdomen:  soft, NT, ND Skin:  no rash or induration seen on limited exam Musculoskeletal:  chronic RUE > RLE weakness Psychiatric:  somnolent, less responsive  (but actively febrile) Neurologic:  chronic R facial droop, chronic R hemiparesis (upper > lower)  Data Reviewed: I have reviewed the patient's lab results since admission.  Pertinent labs for today include:   None     Family Communication: None present  Disposition: Status is: Inpatient Remains inpatient appropriate because: actively dying     Time spent: 35 minutes  Unresulted Labs (From admission, onward)    None        Author: Jonah Blue, MD 10/07/2023 3:10 PM  For on call review [email or web address].

## 2023-10-07 NOTE — Progress Notes (Signed)
Pt tolerated eye drop well. Repositioned, mouth care provided. Ex wife at bedside. Will cont to monitor.

## 2023-10-10 LAB — CULTURE, BLOOD (ROUTINE X 2)
Culture: NO GROWTH
Culture: NO GROWTH

## 2023-10-15 ENCOUNTER — Other Ambulatory Visit: Payer: Self-pay | Admitting: Family Medicine

## 2023-10-16 ENCOUNTER — Other Ambulatory Visit: Payer: Medicare PPO

## 2023-10-17 DIAGNOSIS — E1165 Type 2 diabetes mellitus with hyperglycemia: Secondary | ICD-10-CM | POA: Diagnosis not present

## 2023-10-18 ENCOUNTER — Other Ambulatory Visit: Payer: Medicare PPO | Admitting: Urology

## 2023-10-19 ENCOUNTER — Telehealth: Payer: Self-pay | Admitting: Family Medicine

## 2023-10-19 NOTE — Telephone Encounter (Signed)
Spoke with daughter Lurena Joiner to express my condolences. She was appreciative of the call.

## 2023-10-20 NOTE — Progress Notes (Signed)
    OVERNIGHT PROGRESS REPORT  Notified by RN that patient is deceased as of 0230 Hrs.  Patient was DNR/CMO (comfort measures only) followed by Palliative.  2 RN verified.  Family was present and available to RN.     Chinita Greenland MSNA MSN ACNPC-AG Acute Care Nurse Practitioner [facility]

## 2023-10-20 NOTE — Progress Notes (Signed)
Pt noted at 0230 with no pulse and no respiratory. Family at bedside. Pronounced death  by 2 Rn's per MD order. Body ready at this time to be taken to the [facility].

## 2023-10-20 NOTE — Death Summary Note (Signed)
DEATH SUMMARY   Patient Details  Name: James VANDERHOEF, MD MRN: [id number] DOB: 1931/08/03 [id number], Wynona Canes, MD Admission/Discharge Information   Admit Date:  October 19, 2023  Date of Death: Date of Death: 10-27-23  Time of Death: Time of Death: 0230  Length of Stay: 8   Principle Cause of death: Aspiration pneumonia  Hospital Diagnoses: Principal Problem:   Aspiration pneumonia (HCC) Active Problems:   UTI (urinary tract infection)   Acute metabolic encephalopathy   Chronic diastolic CHF (congestive heart failure) (HCC)   Chronic kidney disease, stage 3b (HCC)   Hypothyroidism   Restless leg syndrome, severe   BPH with obstruction/lower urinary tract symptoms   OSA on CPAP   Type 2 diabetes mellitus with hyperglycemia (HCC)   GERD (gastroesophageal reflux disease)   Acute kidney injury (HCC)   Glaucoma   Concern about end of life   DNR (do not resuscitate)   Agitation   Pain   Shortness of breath   Need for emotional support   Goals of care, counseling/discussion   High risk medication use   Counseling and coordination of care   Palliative care encounter   Hospital Course: 89yo with h/o HLD, DM, chronic diastolic CHF, CVA, hypothyroidism, dementia, BPH, stage 3b CKD, OSA on CPAP, and recent admission for VRE UTI who presented on October 19, 2023 with AMS.  He was recently admitted from 12/26-1/7 for COVID and UTI with encephalopathy.  Persistent aspiration, developed aspiration PNA with worsening encephalopathy.  Palliative care consulted, transitioned to comfort measures.  Anticipate in-hospital death.  Assessment and Plan:  Dysphagia with recurrent aspiration PNA He has chronic dysphagia and had been placed on a modified diet (dysphagia 2, nectar thick) Despite this, he reported an aspiration event 1/15 AM SLP reconsulted, changed to dysphagia 3 diet Developed fever, lethargy, leukocytosis in early AM on 1/17, started back on Unasyn for presumed aspiration  PNA Recurrent fever, tachycardia, lethargy 1/18 With assistance from palliative care, patient was transitioned to comfort care After further discussions with family, he was started on continuous morphine infusion He died peacefully overnight on Oct 27, 2023   Acute metabolic encephalopathy Patient presenting with encephalopathy as evidenced by generalized weakness and some confusion His daughter reports that he does not have underlying dementia, although based on evaluations during prior hospitalization and current hospitalization I have concerns about mild dementia with waxing and waning mental status as well as FTT (nursing staff agrees that patient is "forgetful") While initial concern was for recurrent (MDR) UTI, urine culture grew <10k colonies PT/OT/SLP consults -> SNF rehab Meanwhile, he developed worsening lethargy with dysarthria associated with aspiration PNA Persistent lethargy appears to be related to ongoing acute illness    DNR/GOC Patient confirmed to be DNR on admission Patient continued to have setbacks with multiple recent hospitalizations; palliative care consulted Patient has had hospitalization for almost a month now  Based on ongoing GOC conversations, he is now on comfort measures Patient had an uncomplicated in-hospital death         Consultants: Palliative SLP PT OT TOC team   Procedures: None   Antibiotics: Meropenem 2011/10/19 Zosyn 1/13-14 Unasyn 1/17-18  The results of significant diagnostics from this hospitalization (including imaging, microbiology, ancillary and laboratory) are listed below for reference.   Significant Diagnostic Studies: CT ANGIO HEAD NECK W WO CM W PERF Result Date: 10/05/2023 CLINICAL DATA:  Encephalopathy, generalized weakness, confusion EXAM: CT ANGIOGRAPHY HEAD AND NECK TECHNIQUE: Multidetector CT imaging of the head and neck was performed using the standard  protocol during bolus administration of intravenous contrast. Multiplanar  CT image reconstructions and MIPs were obtained to evaluate the vascular anatomy. Carotid stenosis measurements (when applicable) are obtained utilizing NASCET criteria, using the distal internal carotid diameter as the denominator. RADIATION DOSE REDUCTION: This exam was performed according to the departmental dose-optimization program which includes automated exposure control, adjustment of the mA and/or kV according to patient size and/or use of iterative reconstruction technique. CONTRAST:  60mL OMNIPAQUE IOHEXOL 350 MG/ML SOLN COMPARISON:  09/30/2023 CT head, 09/13/2023 CTA head and neck FINDINGS: CT HEAD FINDINGS Brain: No evidence of acute infarct, hemorrhage, mass, mass effect, or midline shift. No hydrocephalus or extra-axial fluid collection. Age related cerebral atrophy. Redemonstrated remote infarct involving the posterior left lentiform nucleus. Periventricular white matter changes, likely the sequela of chronic small vessel ischemic disease. Vascular: No hyperdense vessel. Atherosclerotic calcifications in the intracranial carotid and vertebral arteries. Skull: Negative for fracture or focal lesion. Sinuses/Orbits: Mucosal thickening in the ethmoid air cells. Status post bilateral lens replacements. Other: The mastoid air cells are well aerated. CTA NECK FINDINGS Aortic arch: Standard branching. Imaged portion shows no evidence of aneurysm or dissection. No significant stenosis of the major arch vessel origins. Aortic atherosclerosis. Right carotid system: No evidence of dissection, occlusion, or hemodynamically significant stenosis (greater than 50%). Left carotid system: No evidence of dissection, occlusion, or hemodynamically significant stenosis (greater than 50%). Vertebral arteries: No evidence of dissection, occlusion, or hemodynamically significant stenosis (greater than 50%). Skeleton: No acute osseous abnormality. Degenerative changes in the cervical spine. Other neck: No acute finding. Upper  chest: Mild ground-glass opacities in the anterior right upper lobe (series 15, image 7). Review of the MIP images confirms the above findings CTA HEAD FINDINGS Anterior circulation: Both internal carotid arteries are patent to the termini, with calcifications but without significant stenosis. A1 segments patent. Normal anterior communicating artery. Anterior cerebral arteries are patent to their distal aspects without significant stenosis. Mild stenosis in the proximal left M 1 (series 15, image 202). No significant stenosis in the right M1. MCA branches perfused to their distal aspects without significant stenosis. Posterior circulation: Vertebral arteries patent to the vertebrobasilar junction without significant stenosis. Posterior inferior cerebellar arteries patent proximally. Basilar patent to its distal aspect with mild stenosis distally (series 15, image 197). Superior cerebellar arteries patent proximally. Redemonstrated multifocal stenosis in the bilateral PCAs, which is mild proximal right P2, moderate to severe in the mid to distal right P2, and severe in the more medial right P3. Mild stenosis in the left P1 and proximal left P2, and severe stenosis in the distal left P2 extending into the left P3 and in a more distal left PCA branch. This appears overall unchanged from the prior exam. The bilateral posterior communicating arteries are not visualized. Venous sinuses: As permitted by contrast timing, patent. Anatomic variants: None significant. No evidence of aneurysm or vascular malformation. Review of the MIP images confirms the above findings IMPRESSION: 1. No acute intracranial process. 2. No intracranial large vessel occlusion. Redemonstrated multifocal stenosis in the bilateral PCAs, Summa which is severe, overall unchanged from the prior exam. 3. No hemodynamically significant stenosis in the neck. 4. Mild ground-glass opacities in the anterior right upper lobe, which could be infectious or  inflammatory. 5. Aortic atherosclerosis. Aortic Atherosclerosis (ICD10-I70.0). Electronically Signed   By: Wiliam Ke M.D.   On: 10/05/2023 12:35   DG Chest Port 1 View Result Date: 10/05/2023 CLINICAL DATA:  Aspiration. EXAM: PORTABLE CHEST 1 VIEW COMPARISON:  10/03/2023. FINDINGS: The heart size and mediastinal contours are within normal limits. Lung volumes are low with mild atelectasis or infiltrate at the lung bases. No effusion or pneumothorax is seen. No acute osseous abnormality. IMPRESSION: Low lung volumes with atelectasis or infiltrate at the lung bases. Electronically Signed   By: Thornell Sartorius M.D.   On: 10/05/2023 00:43   DG Swallowing Func-Speech Pathology Result Date: 10/01/2023 Table formatting from the original result was not included. Modified Barium Swallow Study Patient Details Name: James BUNYARD, MD MRN: [id number] Date of Birth: Jan 22, 1931 Today's Date: 10/01/2023 HPI/PMH: HPI: Patient is a 89 y.o. male with PMH: GERD, hiatal hernia, anemia, basal cell carcinoma, BPH, CKD, CVA (resultant dysphagia) , dehydration, depression, HLD, bilateral renal cysts, migraine headaches, OSA on CPAP, palpitations, presbycusis of both ears, restless leg syndrome, rosacea, TIA, type 2 diabetes, diabetic peripheral neuropathy. He was recently admitted at [facility] for 12 days due to Covid-19 and UTI (discharged home five days prior to current admission). He presented to [facility] via EMS due to decreased mentation and generalized weakness. Clinical Impression: Clinical Impression: Patient presents with what appears to be primarily a sensory-based oropharyngeal dysphagia in addition to a secondary motor-based dysphagia. Of note, based on comparision of MBS documentation s/p CVA while patient was in CIR in 2013, his current presentation is very similar. During today's MBS, patient exhibited delayed anterior to posterior transit of solids and liquids and delayed mastication. Swallow initiation was delayed at  level of pyriform sinus with thin and nectar thick liquids and delayed at level of vallecular sinus with honey, puree and mechanical soft solids. Anterior hyoid excursion and laryngeal elevation were partial in completion. Epiglottic inversion and laryngeal vestibule closure were complete, however both were significantly delayed. Silent aspiration (PAS 8) occured before the swallow with thin liquids with bolus transiting into open airway. Chin tuck posture did not consistently decrease or limit frequency or amount of aspiration. With head in neutral position, patient exhibited penetration to the cords and aspiration just below vocal cords with nectar thick liquids. Chin tuck was effective to prevent penetration and aspiration of nectar thick liquids. Patient was able to demonstrate consistent and effective performance of chin tuck, as he had previously been taught to do it. No penetration or aspiration observed with honey thick liquids. Diffuse residuals observed with liquid barium consistencies, however amount was mild and no aspiration observed from pharyngeal residuals. With puree and mechanical soft solids, a moderate amount of bolus remained in oropharynx and vallecular sinus s/p initial swallow, requiring patient to swallow a second time to clear. SLP discussed results and recommendations with patient after study was completed and he demonstrated understanding. SLP is recommending Dys 2 (minced) solids, nectar thick liquids with chin tuck posture when drinking liquids. In addition, patient may have plain, thin water in between meals and after oral care, with chin tuck posture recommended for all swallows. Factors that may increase risk of adverse event in presence of aspiration Rubye Oaks & Clearance Coots 2021): Factors that may increase risk of adverse event in presence of aspiration Rubye Oaks & Clearance Coots 2021): Frail or deconditioned; Limited mobility Recommendations/Plan: Swallowing Evaluation Recommendations Swallowing  Evaluation Recommendations Recommendations: PO diet PO Diet Recommendation: Dysphagia 2 (Finely chopped); Mildly thick liquids (Level 2, nectar thick) Liquid Administration via: Cup; No straw Medication Administration: Other (Comment) (crush if large, whole if small in puree) Supervision: Patient able to self-feed; Intermittent supervision/cueing for swallowing strategies Swallowing strategies  : Follow solids with liquids; Small bites/sips; Slow rate; Chin  tuck Postural changes: Position pt fully upright for meals; Stay upright 30-60 min after meals Oral care recommendations: Oral care BID (2x/day) Treatment Plan Treatment Plan Treatment recommendations: Therapy as outlined in treatment plan below Follow-up recommendations: Follow physicians's recommendations for discharge plan and follow up therapies Functional status assessment: Patient has had a recent decline in their functional status and demonstrates the ability to make significant improvements in function in a reasonable and predictable amount of time. Treatment frequency: Min 2x/week Treatment duration: 2 weeks Interventions: Aspiration precaution training; Compensatory techniques; Patient/family education; Trials of upgraded texture/liquids; Diet toleration management by SLP Recommendations Recommendations for follow up therapy are one component of a multi-disciplinary discharge planning process, led by the attending physician.  Recommendations may be updated based on patient status, additional functional criteria and insurance authorization. Assessment: Orofacial Exam: Orofacial Exam Oral Cavity: Oral Hygiene: WFL Oral Cavity - Dentition: Adequate natural dentition Orofacial Anatomy: WFL Anatomy: Anatomy: WFL Boluses Administered: Boluses Administered Boluses Administered: Thin liquids (Level 0); Mildly thick liquids (Level 2, nectar thick); Puree; Moderately thick liquids (Level 3, honey thick); Solid  Oral Impairment Domain: Oral Impairment Domain Lip  Closure: No labial escape Tongue control during bolus hold: Not tested Bolus preparation/mastication: Slow prolonged chewing/mashing with complete recollection Bolus transport/lingual motion: Slow tongue motion Oral residue: Trace residue lining oral structures Location of oral residue : Palate Initiation of pharyngeal swallow : Pyriform sinuses; Valleculae  Pharyngeal Impairment Domain: Pharyngeal Impairment Domain Soft palate elevation: No bolus between soft palate (SP)/pharyngeal wall (PW) Laryngeal elevation: Partial superior movement of thyroid cartilage/partial approximation of arytenoids to epiglottic petiole Anterior hyoid excursion: Partial anterior movement Epiglottic movement: Complete inversion Laryngeal vestibule closure: Complete, no air/contrast in laryngeal vestibule Pharyngeal stripping wave : Present - complete Pharyngeal contraction (A/P view only): N/A Pharyngoesophageal segment opening: Complete distension and complete duration, no obstruction of flow Tongue base retraction: Trace column of contrast or air between tongue base and PPW Pharyngeal residue: Trace residue within or on pharyngeal structures Location of pharyngeal residue: Valleculae; Pyriform sinuses; Pharyngeal wall; Tongue base; Diffuse (>3 areas)  Esophageal Impairment Domain: Esophageal Impairment Domain Esophageal clearance upright position: Complete clearance, esophageal coating Pill: No data recorded Penetration/Aspiration Scale Score: Penetration/Aspiration Scale Score 1.  Material does not enter airway: Puree; Solid 2.  Material enters airway, remains ABOVE vocal cords then ejected out: Moderately thick liquids (Level 3, honey thick) 5.  Material enters airway, CONTACTS cords and not ejected out: Mildly thick liquids (Level 2, nectar thick) 8.  Material enters airway, passes BELOW cords without attempt by patient to eject out (silent aspiration) : Thin liquids (Level 0); Mildly thick liquids (Level 2, nectar thick)  Compensatory Strategies: Compensatory Strategies Compensatory strategies: Yes Chin tuck: Effective Effective Chin Tuck: Thin liquid (Level 0); Mildly thick liquid (Level 2, nectar thick)   General Information: No data recorded Diet Prior to this Study: Regular; Thin liquids (Level 0)   Temperature : Normal   Respiratory Status: WFL   Supplemental O2: None (Room air)   History of Recent Intubation: No  Behavior/Cognition: Alert; Cooperative; Pleasant mood Self-Feeding Abilities: Able to self-feed Baseline vocal quality/speech: Hypophonia/low volume; Normal Volitional Cough: Able to elicit Volitional Swallow: Able to elicit Exam Limitations: No limitations Goal Planning: Prognosis for improved oropharyngeal function: Good No data recorded No data recorded Patient/Family Stated Goal: patient told SLP "I want you to help me with this" (referring to his swallow function) Consulted and agree with results and recommendations: Patient Pain: Pain Assessment Pain Assessment: No/denies pain  End of Session: Start Time:SLP Start Time (ACUTE ONLY): 1445 Stop Time: SLP Stop Time (ACUTE ONLY): 1510 Time Calculation:SLP Time Calculation (min) (ACUTE ONLY): 25 min Charges: SLP Evaluations $ SLP Speech Visit: 1 Visit SLP Evaluations $BSS Swallow: 1 Procedure $MBS Swallow: 1 Procedure SLP visit diagnosis: SLP Visit Diagnosis: Dysphagia, oropharyngeal phase (R13.12) Past Medical History: Past Medical History: Diagnosis Date  Anemia   Basal cell carcinoma 2018  R temple   Basal cell carcinoma of nose 2002  bridge of nose  BPH with obstruction/lower urinary tract symptoms   failed flomax, uroxatral, myrbetriq - sees urology Patsi Sears) pending videourodynamics  CKD (chronic kidney disease) stage 3, GFR 30-59 ml/min (HCC) 03/11/2012  Renal US 02/2014 - multiple kidney cysts   CVA (cerebral infarction) 03/07/2012  slurred speech; right hemplegia, left handed - completed PT 07/2014 (17 sessions)  Dehydration   Depression   mild, on cymbalta  per prior PCP  Erectile dysfunction   per urology (prostaglandin injection)  GERD (gastroesophageal reflux disease)   with sliding HH  H/O hiatal hernia   History of kidney problems 12/2010  lacerated left kidney after fall  HLD (hyperlipidemia)   Hx of basal cell carcinoma 07/10/2017  R. lat. forehead near hair line  Kidney cysts 02/2014  bilateral (renal US)  Migraines 03/11/2012  now rare  OSA on CPAP   sleep study 01/2014 - severe, CPAP at 10, previously on nuvigil (Dr. Gareth Morgan)  Palpitations 01/2012  holter WNL, rare PAC/PVCs  Presbycusis of both ears 2016  rec amplification [facility])  Restless leg syndrome   well controlled on mirapex  Rosacea   Gwen Pounds  TIA (transient ischemic attack)   Type 2 diabetes, controlled, with neuropathy (HCC) 2011  Dr Sharl Ma in [facility] Past Surgical History: Past Surgical History: Procedure Laterality Date  ABI  09/18/2005  WNL  AMPUTATION TOE Left 03/02/2023  Procedure: 64403 - TOE INTERPHALANGEAL JOINT - SECOND;  Surgeon: Rosetta Posner, DPM;  Location: [facility] ORS;  Service: Podiatry;  Laterality: Left;  CARDIAC CATHETERIZATION  09/18/2002  normal; [place]  CARDIOVASCULAR STRESS TEST  02/16/2014  WNL, low risk scan, EF 68%  CAROTID ENDARTERECTOMY    pt denies 02/27/23  CATARACT EXTRACTION W/ INTRAOCULAR LENS IMPLANT  11//2012 - 08/2011  COLONOSCOPY  03/19/2011  poor prep, unable to biopsy polyp in tic fold, rpt 3 mo Pearlean Brownie)  COLONOSCOPY  06/19/2011  mult polyps adenomatous, severe diverticulosis, rpt 3 yrs Pearlean Brownie)  CYSTOSCOPY WITH URETHRAL DILATATION N/A 07/24/2013  Procedure: CYSTOSCOPY WITH URETHRAL DILATATION;  Surgeon: Kathi Ludwig, MD;  Location: [facility] ORS;  Service: Urology;  Laterality: N/A;  ESOPHAGOGASTRODUODENOSCOPY  09/18/2004  duodenitis, medual HH, Grade 2 reflux, mild gastritis  ESOPHAGOGASTRODUODENOSCOPY  07/20/2011  mild chronic gastritis  HEMORRHOID SURGERY  09/18/1989  INGUINAL HERNIA REPAIR  1981; 2002  left, then repaired  PROSTATE SURGERY  02/16/2009  PVP  (laser)  SKIN CANCER EXCISION  09/18/2000  bridge of nose, BCC  TONSILLECTOMY AND ADENOIDECTOMY  ~ 1945  TRANSURETHRAL INCISION OF PROSTATE N/A 07/24/2013  Procedure: TRANSURETHRAL INCISION OF THE PROSTATE (TUIP);  Surgeon: Kathi Ludwig, MD;  Location: [facility] ORS;  Service: Urology;  Laterality: N/A;  URETHRAL DILATION  09/18/2012  tannenbaum Elio Forget Tarrell 10/01/2023, 4:10 PM  CT HEAD WO CONTRAST ( ) Result Date: 09/30/2023 CLINICAL DATA:  Mental status change.  Unknown cause. EXAM: CT HEAD WITHOUT CONTRAST TECHNIQUE: Contiguous axial images were obtained from the base of the skull through the vertex without intravenous contrast. RADIATION DOSE REDUCTION: This  exam was performed according to the departmental dose-optimization program which includes automated exposure control, adjustment of the mA and/or kV according to patient size and/or use of iterative reconstruction technique. COMPARISON:  MR head without contrast 09/13/2023. FINDINGS: Brain: Mild atrophy and white matter changes are stable. Remote infarct involving the posterior left lentiform nucleus is again noted. Acute infarct or hemorrhage is present. The ventricles are proportionate to the degree of atrophy. Ex vacuo dilation of the atrium of the left lateral ventricle is noted. No significant extraaxial fluid collection is present. The brainstem and cerebellum are within normal limits. Midline structures are within normal limits. Vascular: Atherosclerotic calcifications are present within the cavernous internal carotid arteries bilaterally. No hyperdense vessel is present. Skull: Calvarium is intact. No focal lytic or blastic lesions are present. No significant extracranial soft tissue lesion is present. Sinuses/Orbits: Bilateral lens replacements are noted. Globes and orbits are otherwise unremarkable. Bilateral lens replacements are noted. Globes and orbits are otherwise unremarkable. IMPRESSION: 1. No acute intracranial abnormality or  significant interval change. 2. Stable atrophy and white matter disease. This likely reflects the sequela of chronic microvascular ischemia. 3. Remote infarct involving the posterior left lentiform nucleus. Electronically Signed   By: Marin Roberts M.D.   On: 09/30/2023 12:17   DG Chest Portable 1 View Result Date: 09/30/2023 CLINICAL DATA:  Acute kidney injury.  COVID infection. EXAM: PORTABLE CHEST 1 VIEW COMPARISON:  09/15/2023 FINDINGS: The heart size and mediastinal contours are within normal limits. Both lungs are clear. The visualized skeletal structures are unremarkable. IMPRESSION: No active disease. Electronically Signed   By: Danae Orleans M.D.   On: 09/30/2023 10:58   DG Chest Port 1 View Result Date: 09/15/2023 CLINICAL DATA:  Cough and COVID-19 positivity, initial encounter EXAM: PORTABLE CHEST 1 VIEW COMPARISON:  10/09/2022 FINDINGS: Cardiac shadow is within normal limits. Aortic calcifications are seen. The lungs are well aerated bilaterally. No focal infiltrate or effusion is seen. No bony abnormality is noted. IMPRESSION: No acute abnormality noted. Electronically Signed   By: Alcide Clever M.D.   On: 09/15/2023 18:58   US Venous Img Lower Unilateral Right (DVT) Result Date: 09/14/2023 CLINICAL DATA:  Right leg swelling EXAM: RIGHT LOWER EXTREMITY VENOUS DOPPLER ULTRASOUND TECHNIQUE: Gray-scale sonography with compression, as well as color and duplex ultrasound, were performed to evaluate the deep venous system(s) from the level of the common femoral vein through the popliteal and proximal calf veins. COMPARISON:  None Available. FINDINGS: VENOUS Normal compressibility of the common femoral, superficial femoral, and popliteal veins, as well as the visualized calf veins. Visualized portions of profunda femoral vein and great saphenous vein unremarkable. No filling defects to suggest DVT on grayscale or color Doppler imaging. Doppler waveforms show normal direction of venous flow,  normal respiratory plasticity and response to augmentation. Limited views of the contralateral common femoral vein are unremarkable. OTHER None. Limitations: none IMPRESSION: Negative. Electronically Signed   By: Charlett Nose M.D.   On: 09/14/2023 03:07   MR BRAIN WO CONTRAST Result Date: 09/14/2023 CLINICAL DATA:  Generalized weakness, speech changes EXAM: MRI HEAD WITHOUT CONTRAST TECHNIQUE: Multiplanar, multiecho pulse sequences of the brain and surrounding structures were obtained without intravenous contrast. COMPARISON:  MRI head 06/12/2023, CT head 09/13/2023 FINDINGS: Evaluation is limited by motion affecting several sequences. Brain: No restricted diffusion to suggest acute or subacute infarct. No acute hemorrhage, mass, mass effect, or midline shift. No hydrocephalus or extra-axial collection. Pituitary and craniocervical junction within normal limits. Hemosiderin deposition is associated  with a remote lacunar infarct in the left posterior lentiform nucleus. No other hemosiderin deposition. Confluent T2 hyperintense signal in the periventricular white matter and pons, likely the sequela of moderate chronic small vessel ischemic disease. Vascular: Normal arterial flow voids. Skull and upper cervical spine: Normal marrow signal. Sinuses/Orbits: Clear paranasal sinuses. Status post bilateral lens replacements. Other: The mastoid air cells are well aerated. IMPRESSION: No acute intracranial process. No evidence of acute or subacute infarct. Electronically Signed   By: Wiliam Ke M.D.   On: 09/14/2023 00:41   CT ANGIO HEAD NECK W WO CM W PERF Result Date: 09/13/2023 CLINICAL DATA:  Stroke suspected, speech changes EXAM: CT ANGIOGRAPHY HEAD AND NECK CT PERFUSION BRAIN TECHNIQUE: Multidetector CT imaging of the head and neck was performed using the standard protocol during bolus administration of intravenous contrast. Multiplanar CT image reconstructions and MIPs were obtained to evaluate the vascular  anatomy. Carotid stenosis measurements (when applicable) are obtained utilizing NASCET criteria, using the distal internal carotid diameter as the denominator. Multiphase CT imaging of the brain was performed following IV bolus contrast injection. Subsequent parametric perfusion maps were calculated using RAPID software. RADIATION DOSE REDUCTION: This exam was performed according to the departmental dose-optimization program which includes automated exposure control, adjustment of the mA and/or kV according to patient size and/or use of iterative reconstruction technique. CONTRAST:  OMNIPAQUE IOHEXOL 350 MG/ML SOLN COMPARISON:  09/13/2023 CT head FINDINGS: CT HEAD FINDINGS For noncontrast findings, please see same day CT head. CTA NECK FINDINGS Aortic arch: Standard branching. Imaged portion shows no evidence of aneurysm or dissection. No significant stenosis of the major arch vessel origins. Mild aortic atherosclerosis. Right carotid system: No evidence of dissection, occlusion, or hemodynamically significant stenosis (greater than 50%). Left carotid system: No evidence of dissection, occlusion, or hemodynamically significant stenosis (greater than 50%). Vertebral arteries: No evidence of dissection, occlusion, or hemodynamically significant stenosis (greater than 50%). With calcifications but Skeleton: No acute osseous abnormality. Degenerative changes in the cervical spine. Other neck: No acute finding. Upper chest: No focal pulmonary opacity or pleural effusion. Review of the MIP images confirms the above findings CTA HEAD FINDINGS Anterior circulation: Both internal carotid arteries are patent to the termini, without significant stenosis. A1 segments patent. Normal anterior communicating artery. Anterior cerebral arteries are patent to their distal aspects without significant stenosis. Mild stenosis in the proximal left M1 (series 6, image 95). No significant stenosis in the right M1. In MCA branches  perfused to their distal aspects without significant stenosis. Posterior circulation: Vertebral arteries patent to the vertebrobasilar junction without significant stenosis. Posterior inferior cerebellar arteries patent proximally. Basilar patent to its distal aspect without significant stenosis. Superior cerebellar arteries patent proximally. Mild stenosis in the left P1 (series 6, image 90) and right P1 (series 6, image 89). Additional mild stenosis in the proximal right P 2 (series 6, image 91), moderate to severe stenosis in the mid to distal right P2 (series 6, image 99), and severe stenosis in the more medial right P3 (series 6, image 105). Mild stenosis in the proximal left P 2 (series 6, image 101), and severe stenosis in the distal left P2 extending into left P3 (series 6, images 102-105). The bilateral posterior communicating arteries are not visualized. Venous sinuses: As permitted by contrast timing, patent. Anatomic variants: None significant. No evidence of aneurysm or vascular malformation. Review of the MIP images confirms the above findings CT Brain Perfusion Findings: CBF (<30%) Volume: 0mL Perfusion (Tmax>6.0s) volume: 0mL Mismatch  Volume: 0mL Infarction Location:None IMPRESSION: 1. No intracranial large vessel occlusion. 2. Multifocal stenosis in the bilateral PCAs, some of which is severe. 3. Mild stenosis in the proximal left M1. 4. No hemodynamically significant stenosis in the neck. 5. No evidence of infarct core or penumbra on CT perfusion. 6. Aortic atherosclerosis. Aortic Atherosclerosis (ICD10-I70.0). Electronically Signed   By: Wiliam Ke M.D.   On: 09/13/2023 22:23   CT HEAD WO CONTRAST Result Date: 09/13/2023 CLINICAL DATA:  Neurologic deficit.  Concern for stroke. EXAM: CT HEAD WITHOUT CONTRAST TECHNIQUE: Contiguous axial images were obtained from the base of the skull through the vertex without intravenous contrast. RADIATION DOSE REDUCTION: This exam was performed according  to the departmental dose-optimization program which includes automated exposure control, adjustment of the mA and/or kV according to patient size and/or use of iterative reconstruction technique. COMPARISON:  Head CT dated 07/22/2023. FINDINGS: Brain: Mild age-related atrophy and moderate chronic microvascular ischemic changes. Left basal ganglia old lacunar infarct. There is no acute intracranial hemorrhage. No mass effect or midline shift. No extra-axial fluid collection. Vascular: No hyperdense vessel or unexpected calcification. Skull: Normal. Negative for fracture or focal lesion. Sinuses/Orbits: The visualized paranasal sinuses and mastoid air cells are clear. Other: None IMPRESSION: 1. No acute intracranial pathology. 2. Mild age-related atrophy and moderate chronic microvascular ischemic changes. Left basal ganglia old lacunar infarct. Electronically Signed   By: Elgie Collard M.D.   On: 09/13/2023 18:52    Microbiology: Recent Results (from the past 240 hours)  Urine Culture     Status: Abnormal   Collection Time: 09/30/23 12:27 PM   Specimen: Urine, Clean Catch  Result Value Ref Range Status   Specimen Description   Final    URINE, CLEAN CATCH Performed at [facility], [address] [place], [place] [address]    Special Requests   Final    NONE Performed at [facility], [address], [place], [place] [address]    Culture (A)  Final    <10,000 COLONIES/mL INSIGNIFICANT GROWTH Performed at [facility], [address] [place], [place] [address]    Report Status 10/01/2023 FINAL  Final  Respiratory (~20 pathogens) panel by PCR     Status: None   Collection Time: 10/01/23 12:50 PM   Specimen: Nasopharyngeal Swab; Respiratory  Result Value Ref Range Status   Adenovirus NOT DETECTED NOT DETECTED Final   Coronavirus 229E NOT DETECTED NOT DETECTED Final    Comment: (NOTE) The Coronavirus on the Respiratory Panel, DOES NOT test for the novel   Coronavirus (2019 nCoV)    Coronavirus HKU1 NOT DETECTED NOT DETECTED Final   Coronavirus NL63 NOT DETECTED NOT DETECTED Final   Coronavirus OC43 NOT DETECTED NOT DETECTED Final   Metapneumovirus NOT DETECTED NOT DETECTED Final   Rhinovirus / Enterovirus NOT DETECTED NOT DETECTED Final   Influenza A NOT DETECTED NOT DETECTED Final   Influenza B NOT DETECTED NOT DETECTED Final   Parainfluenza Virus 1 NOT DETECTED NOT DETECTED Final   Parainfluenza Virus 2 NOT DETECTED NOT DETECTED Final   Parainfluenza Virus 3 NOT DETECTED NOT DETECTED Final   Parainfluenza Virus 4 NOT DETECTED NOT DETECTED Final   Respiratory Syncytial Virus NOT DETECTED NOT DETECTED Final   Bordetella pertussis NOT DETECTED NOT DETECTED Final   Bordetella Parapertussis NOT DETECTED NOT DETECTED Final   Chlamydophila pneumoniae NOT DETECTED NOT DETECTED Final   Mycoplasma pneumoniae NOT DETECTED NOT DETECTED Final    Comment: Performed at  [facility], [address] [place], [place] [address]  Culture, blood (Routine X 2) w Reflex to ID Panel     Status: None   Collection Time: 10/01/23  1:20 PM   Specimen: BLOOD  Result Value Ref Range Status   Specimen Description   Final    BLOOD BLOOD LEFT HAND Performed at [facility], [address], [place], [place] [address]    Special Requests   Final    BOTTLES DRAWN AEROBIC AND ANAEROBIC Blood Culture results may not be optimal due to an inadequate volume of blood received in culture bottles Performed at [facility], [address] [place], [place] [address]    Culture   Final    NO GROWTH 5 DAYS Performed at [facility], [address], [place], [place] [address]    Report Status 10/06/2023 FINAL  Final  Culture, blood (Routine X 2) w Reflex to ID Panel     Status: None   Collection Time: 10/01/23  1:25 PM   Specimen: BLOOD  Result Value Ref Range Status   Specimen Description   Final    BLOOD BLOOD LEFT  HAND Performed at [facility], [address] [place], [place] [address]    Special Requests   Final    BOTTLES DRAWN AEROBIC ONLY Blood Culture results may not be optimal due to an inadequate volume of blood received in culture bottles Performed at [facility], [address], [place], [place] [address]    Culture   Final    NO GROWTH 5 DAYS Performed at [facility], [address] [place], [place] [address]    Report Status 10/06/2023 FINAL  Final  Culture, blood (x 2)     Status: None (Preliminary result)   Collection Time: 10/05/23  1:11 AM   Specimen: BLOOD RIGHT HAND  Result Value Ref Range Status   Specimen Description   Final    BLOOD RIGHT HAND Performed at [facility], [address], [place], [place] [address]    Special Requests   Final    BOTTLES DRAWN AEROBIC AND ANAEROBIC Blood Culture results may not be optimal due to an inadequate volume of blood received in culture bottles Performed at [facility], [address] [place], [place] [address]    Culture   Final    NO GROWTH 3 DAYS Performed at [facility], [address] [place], [place] [address]    Report Status PENDING  Incomplete  Culture, blood (x 2)     Status: None (Preliminary result)   Collection Time: 10/05/23  1:11 AM   Specimen: BLOOD LEFT HAND  Result Value Ref Range Status   Specimen Description   Final    BLOOD LEFT HAND Performed at [facility], [address], [place], [place] [address]    Special Requests   Final    BOTTLES DRAWN AEROBIC AND ANAEROBIC Blood Culture results may not be optimal due to an inadequate volume of blood received in culture bottles Performed at [facility], [address], [place], [place] [address]    Culture   Final    NO GROWTH 3 DAYS Performed at [facility], [address] [place], [place] [address]    Report Status PENDING  Incomplete     Time spent: <30 minutes  Signed: Jonah Blue, MD 10/10/2023

## 2023-10-20 DEATH — deceased

## 2023-11-08 DIAGNOSIS — K529 Noninfective gastroenteritis and colitis, unspecified: Secondary | ICD-10-CM

## 2023-11-13 ENCOUNTER — Ambulatory Visit: Payer: TRICARE For Life (TFL) | Admitting: Physical Medicine & Rehabilitation

## 2023-12-03 ENCOUNTER — Ambulatory Visit: Payer: Medicare PPO | Admitting: Adult Health

## 2024-04-07 IMAGING — MR MR HEAD W/O CM
12 series · 48 of 48 positions shown · non-contrast
Comparison: Prior head CT from earlier the same day.

CLINICAL DATA: Initial evaluation for acute neuro deficit, stroke
suspected.

EXAM:
MRI HEAD WITHOUT CONTRAST
TECHNIQUE: Multiplanar, multiecho pulse sequences of the brain and surrounding
structures were obtained without intravenous contrast.

[Series 5: ax dwi_tracew · axial · 3.0mm · 0.65mm/px · z∈[-80,+71]mm · 2 of 48 slices shown]
[im 1/48]
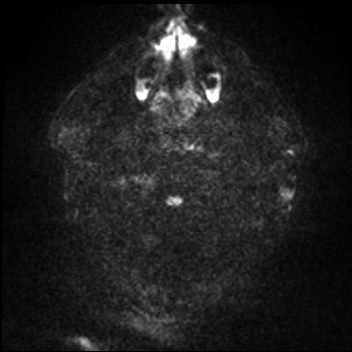
[im 48/48]
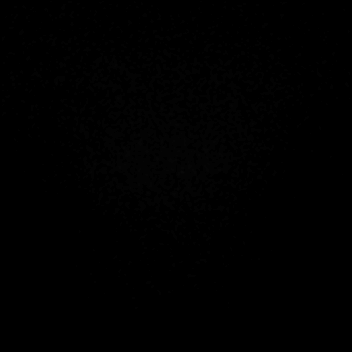

[Series 6: ax dwi_adc · axial · 3.0mm · 0.65mm/px · z∈[-80,+68]mm · 2 of 47 slices shown]
[im 1/47]
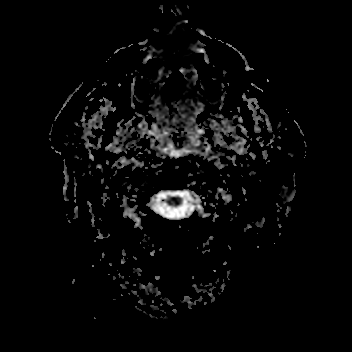
[im 47/47]
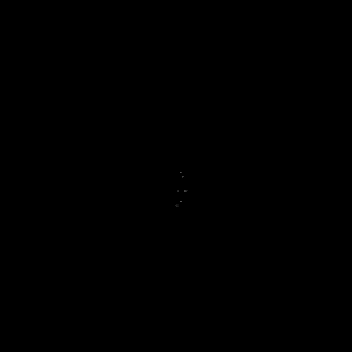

[Series 7: cor dwi_tracew · coronal · 5.0mm · 0.60mm/px · 3 of 38 slices shown]
[im 1/38]
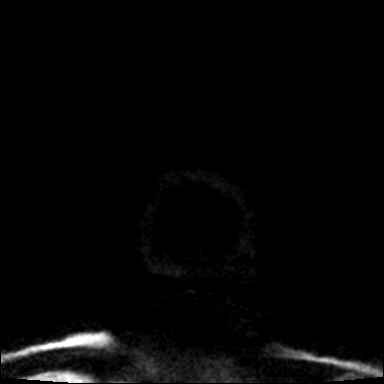
[im 19/38]
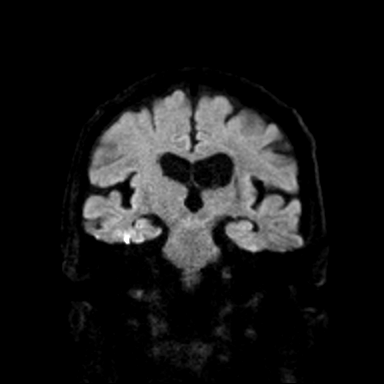
[im 38/38]
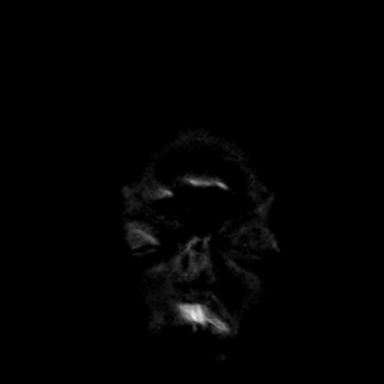

[Series 8: cor dwi_adc · coronal · 5.0mm · 0.60mm/px · 3 of 37 slices shown]
[im 1/37]
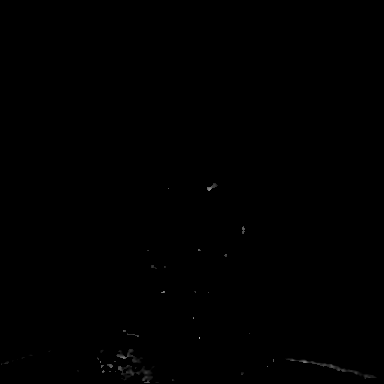
[im 19/37]
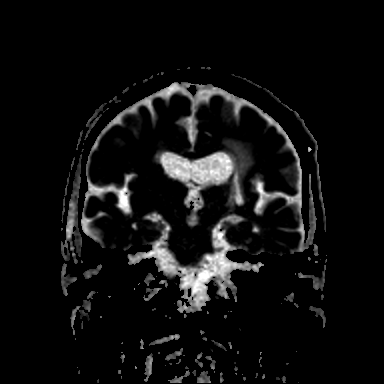
[im 37/37]
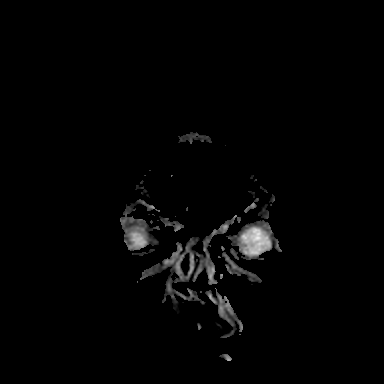

[Series 9: T1 · sagittal · 5.0mm · 0.62mm/px · 2 of 22 slices shown (1 of 2)]
[im 1/22]
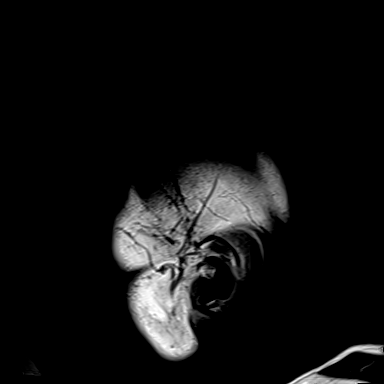
[im 22/22]
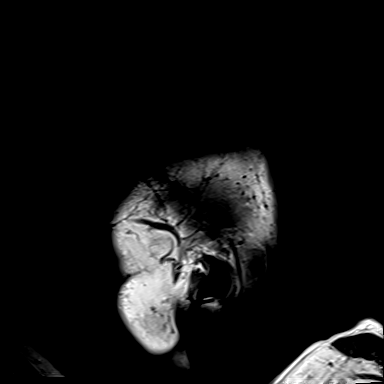

[Series 10: T2 · axial · 5.0mm · 0.53mm/px · z∈[-73,+62]mm · 2 of 24 slices shown (1 of 2)]
[im 1/24]
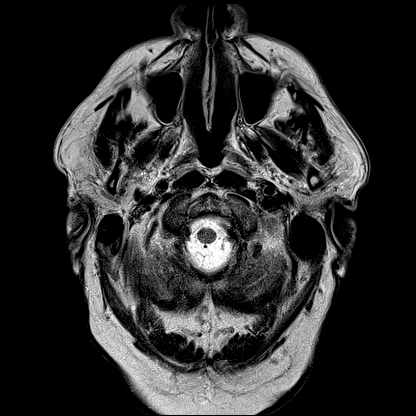
[im 24/24]
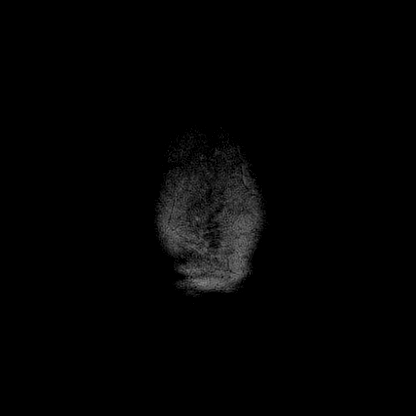

[Series 11: ax swi_mag · axial · 2.0mm · 0.90mm/px · z∈[-82,+73]mm · 6 of 80 slices shown]
[im 1/80]
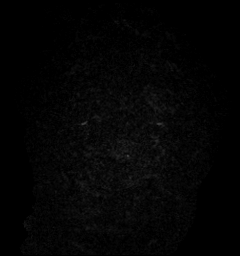
[im 16/80]
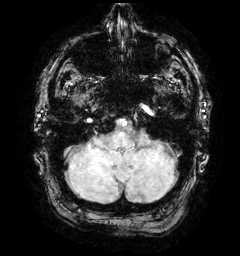
[im 32/80]
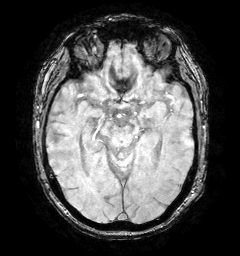
[im 48/80]
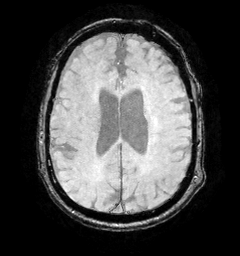
[im 64/80]
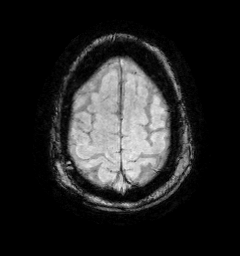
[im 80/80]
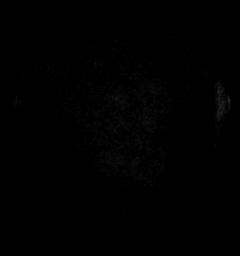

[Series 12: ax swi_pha · axial · 2.0mm · 0.90mm/px · z∈[-82,+73]mm · 6 of 80 slices shown]
[im 1/80]
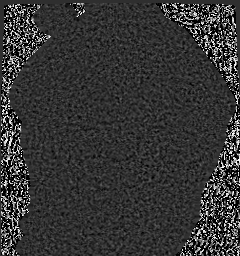
[im 16/80]
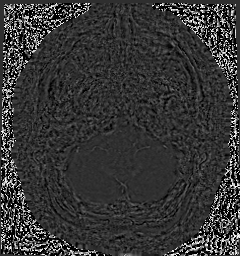
[im 32/80]
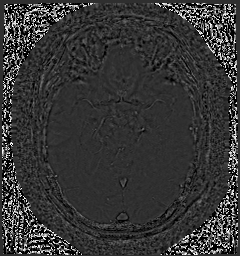
[im 48/80]
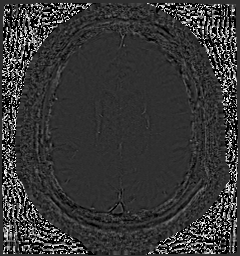
[im 64/80]
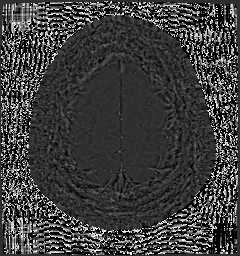
[im 80/80]
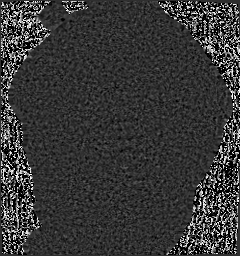

[Series 13: ax swi_swi · axial · 2.0mm · 0.90mm/px · z∈[-82,+73]mm · 6 of 80 slices shown]
[im 1/80]
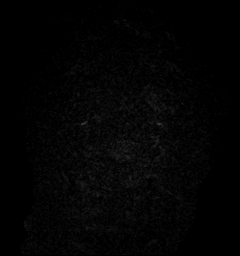
[im 16/80]
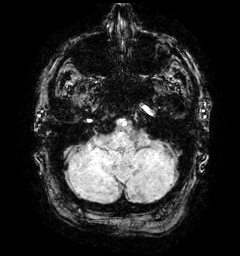
[im 32/80]
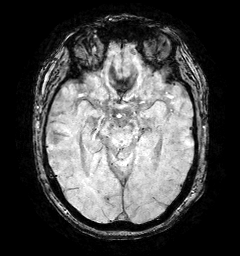
[im 48/80]
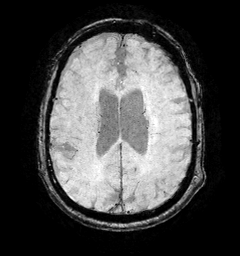
[im 64/80]
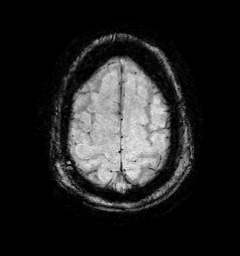
[im 80/80]
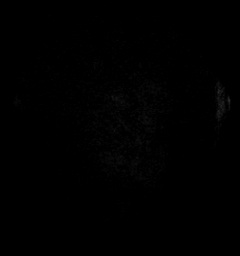

[Series 15: FLAIR · axial · 3.0mm · 0.53mm/px · z∈[-74,+63]mm · 3 of 48 slices shown]
[im 1/48]
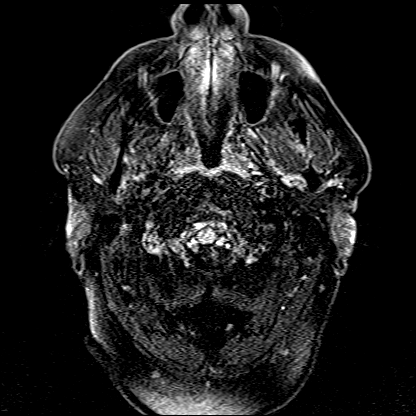
[im 24/48]
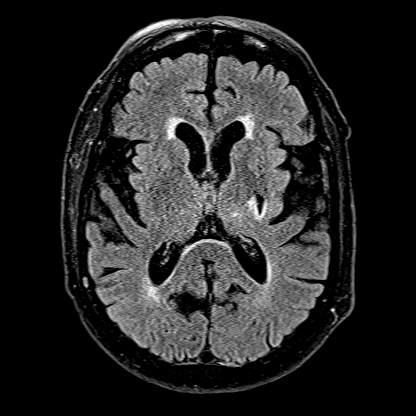
[im 48/48]
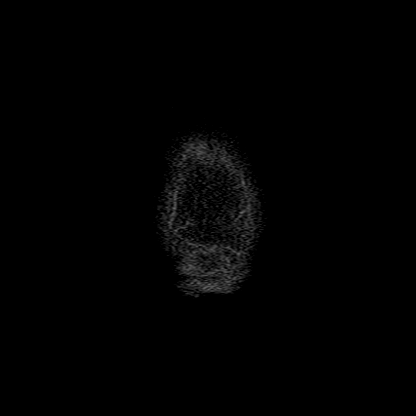

[Series 16: T1 · axial · 1.0mm · 0.98mm/px · z∈[-80,+75]mm · 11 of 160 slices shown (2 of 2)]
[im 1/160]
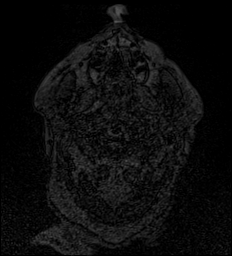
[im 16/160]
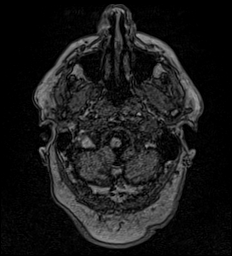
[im 32/160]
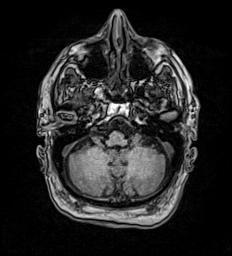
[im 48/160]
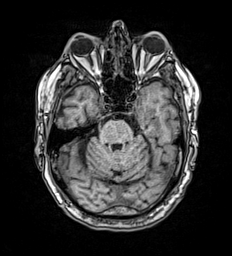
[im 64/160]
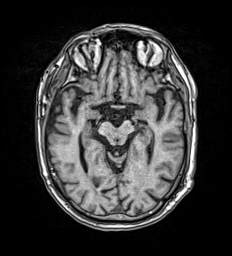
[im 80/160]
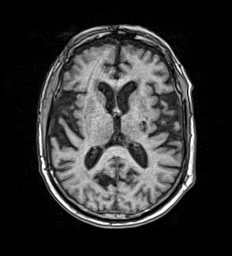
[im 96/160]
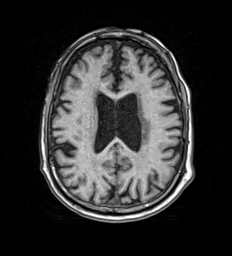
[im 112/160]
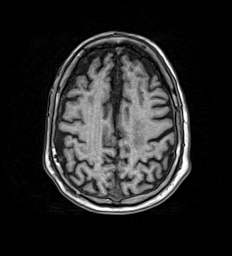
[im 128/160]
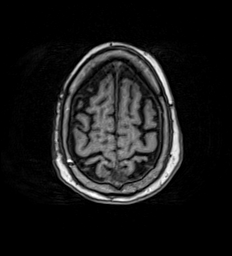
[im 144/160]
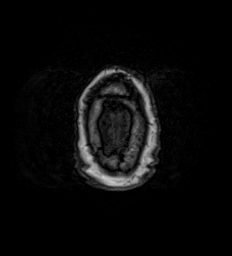
[im 160/160]
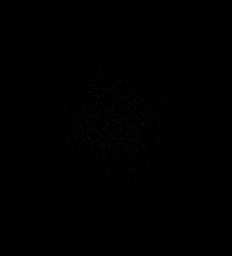

[Series 17: T2 · coronal · 5.0mm · 0.57mm/px · 2 of 28 slices shown (2 of 2)]
[im 1/28]
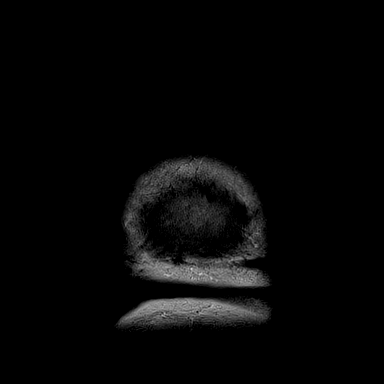
[im 28/28]
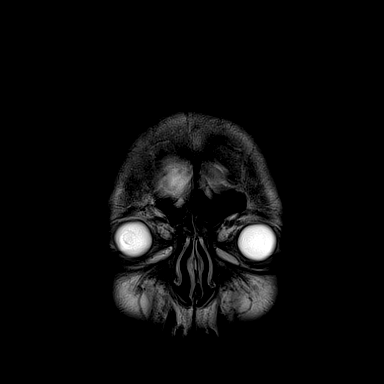

[48 of 48 positions shown; findings below may reference images not displayed]

FINDINGS: Brain: Generalized age-related cerebral atrophy with mild chronic
small vessel ischemic disease. Remote lacunar infarct present at the
left basal ganglia, extending into the left cerebral peduncle.
Associated mild chronic hemosiderin staining.

No evidence for acute or subacute infarct. Gray-white matter
differentiation otherwise maintained. No other areas of chronic
cortical infarction. No other acute or chronic intracranial blood
products.

No mass lesion, mass effect or midline shift. No hydrocephalus or
extra-axial fluid collection. Pituitary gland suprasellar region
normal. Midline structures intact.

Vascular: Major intracranial vascular flow voids are maintained.

Skull and upper cervical spine: Craniocervical junction within
normal limits. Bone marrow signal intensity mildly heterogeneous but
overall within normal limits. No scalp soft tissue abnormality.

Sinuses/Orbits: Patient status post bilateral ocular lens
replacement. Mild scattered mucosal thickening noted throughout the
paranasal sinuses. No mastoid effusion. Inner ear structures grossly
normal.

Other: None.
IMPRESSION: 1. No acute intracranial abnormality.
2. Remote lacunar infarct involving the left basal ganglia with
underlying mild chronic small vessel ischemic disease.
